# Patient Record
Sex: Female | Born: 1960
Health system: Southern US, Community
[De-identification: ages and names within clinical notes are randomized; demographics above are authoritative.]

## PROBLEM LIST (undated history)

## (undated) DIAGNOSIS — F419 Anxiety disorder, unspecified: Secondary | ICD-10-CM

## (undated) DIAGNOSIS — I10 Essential (primary) hypertension: Secondary | ICD-10-CM

## (undated) DIAGNOSIS — K219 Gastro-esophageal reflux disease without esophagitis: Secondary | ICD-10-CM

## (undated) DIAGNOSIS — Z21 Asymptomatic human immunodeficiency virus [HIV] infection status: Secondary | ICD-10-CM

## (undated) DIAGNOSIS — F32A Depression, unspecified: Secondary | ICD-10-CM

## (undated) DIAGNOSIS — F209 Schizophrenia, unspecified: Secondary | ICD-10-CM

## (undated) DIAGNOSIS — F329 Major depressive disorder, single episode, unspecified: Secondary | ICD-10-CM

## (undated) DIAGNOSIS — G459 Transient cerebral ischemic attack, unspecified: Secondary | ICD-10-CM

## (undated) DIAGNOSIS — M1990 Unspecified osteoarthritis, unspecified site: Secondary | ICD-10-CM

## (undated) DIAGNOSIS — E7800 Pure hypercholesterolemia, unspecified: Secondary | ICD-10-CM

## (undated) DIAGNOSIS — Z720 Tobacco use: Secondary | ICD-10-CM

## (undated) DIAGNOSIS — F1910 Other psychoactive substance abuse, uncomplicated: Secondary | ICD-10-CM

## (undated) DIAGNOSIS — B20 Human immunodeficiency virus [HIV] disease: Secondary | ICD-10-CM

## (undated) DIAGNOSIS — S71139A Puncture wound without foreign body, unspecified thigh, initial encounter: Secondary | ICD-10-CM

## (undated) DIAGNOSIS — R569 Unspecified convulsions: Secondary | ICD-10-CM

## (undated) DIAGNOSIS — W3400XA Accidental discharge from unspecified firearms or gun, initial encounter: Secondary | ICD-10-CM

## (undated) HISTORY — DX: Unspecified convulsions: R56.9

## (undated) HISTORY — DX: Accidental discharge from unspecified firearms or gun, initial encounter: W34.00XA

## (undated) HISTORY — DX: Human immunodeficiency virus (HIV) disease: B20

## (undated) HISTORY — DX: Puncture wound without foreign body, unspecified thigh, initial encounter: S71.139A

## (undated) HISTORY — DX: Tobacco use: Z72.0

## (undated) HISTORY — DX: Essential (primary) hypertension: I10

## (undated) HISTORY — PX: OTHER SURGICAL HISTORY: SHX169

## (undated) HISTORY — PX: FRACTURE SURGERY: SHX138

## (undated) HISTORY — DX: Other psychoactive substance abuse, uncomplicated: F19.10

## (undated) HISTORY — DX: Gastro-esophageal reflux disease without esophagitis: K21.9

## (undated) HISTORY — DX: Asymptomatic human immunodeficiency virus (hiv) infection status: Z21

## (undated) HISTORY — DX: Unspecified osteoarthritis, unspecified site: M19.90

## (undated) HISTORY — DX: Transient cerebral ischemic attack, unspecified: G45.9

---

## 1983-09-04 HISTORY — PX: TUBAL LIGATION: SHX77

## 1987-09-04 DIAGNOSIS — S71139A Puncture wound without foreign body, unspecified thigh, initial encounter: Secondary | ICD-10-CM

## 1987-09-04 HISTORY — DX: Puncture wound without foreign body, unspecified thigh, initial encounter: S71.139A

## 2008-09-03 DIAGNOSIS — G459 Transient cerebral ischemic attack, unspecified: Secondary | ICD-10-CM

## 2008-09-03 HISTORY — DX: Transient cerebral ischemic attack, unspecified: G45.9

## 2010-06-12 LAB — HM MAMMOGRAPHY: HM Mammogram: NORMAL

## 2010-06-12 LAB — HM PAP SMEAR: HM Pap smear: NORMAL

## 2011-03-15 ENCOUNTER — Ambulatory Visit (INDEPENDENT_AMBULATORY_CARE_PROVIDER_SITE_OTHER): Payer: Medicare Other

## 2011-03-15 ENCOUNTER — Other Ambulatory Visit: Payer: Self-pay

## 2011-03-15 DIAGNOSIS — Z79899 Other long term (current) drug therapy: Secondary | ICD-10-CM

## 2011-03-15 DIAGNOSIS — B20 Human immunodeficiency virus [HIV] disease: Secondary | ICD-10-CM

## 2011-03-15 DIAGNOSIS — Z113 Encounter for screening for infections with a predominantly sexual mode of transmission: Secondary | ICD-10-CM

## 2011-03-15 DIAGNOSIS — E1169 Type 2 diabetes mellitus with other specified complication: Secondary | ICD-10-CM

## 2011-03-15 DIAGNOSIS — IMO0001 Reserved for inherently not codable concepts without codable children: Secondary | ICD-10-CM

## 2011-03-15 DIAGNOSIS — E119 Type 2 diabetes mellitus without complications: Secondary | ICD-10-CM

## 2011-03-15 DIAGNOSIS — E785 Hyperlipidemia, unspecified: Secondary | ICD-10-CM

## 2011-03-15 DIAGNOSIS — Z111 Encounter for screening for respiratory tuberculosis: Secondary | ICD-10-CM

## 2011-03-15 DIAGNOSIS — F1920 Other psychoactive substance dependence, uncomplicated: Secondary | ICD-10-CM

## 2011-03-15 DIAGNOSIS — J439 Emphysema, unspecified: Secondary | ICD-10-CM

## 2011-03-15 MED ORDER — TRAZODONE HCL 150 MG PO TABS: 150.0000 mg | ORAL_TABLET | Freq: Every day | ORAL | Status: DC

## 2011-03-15 MED ORDER — ARIPIPRAZOLE 30 MG PO TABS: 30.0000 mg | ORAL_TABLET | Freq: Every day | ORAL | Status: DC

## 2011-03-15 MED ORDER — CITALOPRAM HYDROBROMIDE 20 MG PO TABS: 20.0000 mg | ORAL_TABLET | Freq: Every day | ORAL | Status: DC

## 2011-03-16 LAB — COMPLETE METABOLIC PANEL WITH GFR
ALT: 19 U/L (ref 0–35)
AST: 20 U/L (ref 0–37)
Albumin: 3.9 g/dL (ref 3.5–5.2)
CO2: 24 mEq/L (ref 19–32)
Calcium: 8.9 mg/dL (ref 8.4–10.5)
Chloride: 101 mEq/L (ref 96–112)
GFR, Est African American: >60 mL/min (ref >60)
Potassium: 4.1 mEq/L (ref 3.5–5.3)

## 2011-03-16 LAB — T-HELPER CELL (CD4) - (RCID CLINIC ONLY)
CD4 % Helper T Cell: 16 % — ABNORMAL LOW (ref 33–55)
CD4 T Cell Abs: 300 uL — ABNORMAL LOW (ref 400–2700)

## 2011-03-16 LAB — URINALYSIS, ROUTINE W REFLEX MICROSCOPIC
Hgb urine dipstick: NEGATIVE
Leukocytes, UA: NEGATIVE
Nitrite: NEGATIVE
Specific Gravity, Urine: 1.025 (ref 1.005–1.030)
pH: 5.5 (ref 5.0–8.0)

## 2011-03-16 LAB — HEPATITIS C ANTIBODY: HCV Ab: NEGATIVE

## 2011-03-16 LAB — GC/CHLAMYDIA PROBE AMP, URINE: GC Probe Amp, Urine: NEGATIVE

## 2011-03-16 LAB — LIPID PANEL
HDL: 41 mg/dL (ref >39)
LDL Cholesterol: 98 mg/dL (ref 0–99)
Total CHOL/HDL Ratio: 5 Ratio
VLDL: 64 mg/dL — ABNORMAL HIGH (ref 0–40)

## 2011-03-19 LAB — TB SKIN TEST
Induration: 0
TB Skin Test: NEGATIVE mm

## 2011-03-19 LAB — HIV-1 RNA QUANT-NO REFLEX-BLD
HIV 1 RNA Quant: <20 copies/mL (ref <20)
HIV-1 RNA Quant, Log: <1.3 {Log} (ref <1.30)

## 2011-03-19 MED ORDER — TRAZODONE HCL 150 MG PO TABS: 150.0000 mg | ORAL_TABLET | Freq: Every day | ORAL | Status: DC

## 2011-03-19 MED ORDER — CITALOPRAM HYDROBROMIDE 20 MG PO TABS: 20.0000 mg | ORAL_TABLET | Freq: Every day | ORAL | Status: DC

## 2011-03-19 MED ORDER — ARIPIPRAZOLE 30 MG PO TABS
30.0000 mg | ORAL_TABLET | Freq: Every day | ORAL | Status: DC
Start: 2011-03-19 — End: 2011-03-28

## 2011-03-19 NOTE — Telephone Encounter (Signed)
Medications phoned to [facility] pharmacy. Pt says they were not at pharmacy on day of intake when they were requested.  Tammy King,RN IV

## 2011-03-28 ENCOUNTER — Encounter: Payer: Self-pay | Admitting: Internal Medicine

## 2011-03-28 ENCOUNTER — Ambulatory Visit (INDEPENDENT_AMBULATORY_CARE_PROVIDER_SITE_OTHER): Payer: Medicare Other | Admitting: Internal Medicine

## 2011-03-28 DIAGNOSIS — I10 Essential (primary) hypertension: Secondary | ICD-10-CM

## 2011-03-28 DIAGNOSIS — B20 Human immunodeficiency virus [HIV] disease: Secondary | ICD-10-CM | POA: Insufficient documentation

## 2011-03-28 DIAGNOSIS — F259 Schizoaffective disorder, unspecified: Secondary | ICD-10-CM

## 2011-03-28 DIAGNOSIS — E119 Type 2 diabetes mellitus without complications: Secondary | ICD-10-CM

## 2011-03-28 MED ORDER — CYCLOBENZAPRINE HCL 5 MG PO TABS: 5.0000 mg | ORAL_TABLET | Freq: Two times a day (BID) | ORAL | Status: DC | PRN

## 2011-03-28 MED ORDER — CLONIDINE HCL 0.1 MG PO TABS
0.2000 mg | ORAL_TABLET | Freq: Once | ORAL | Status: AC
  Administered 2011-03-28: 0.2 mg via ORAL

## 2011-03-28 MED ORDER — METFORMIN HCL ER (OSM) 1000 MG PO TB24: 1000.0000 mg | ORAL_TABLET | Freq: Two times a day (BID) | ORAL | Status: DC

## 2011-03-28 MED ORDER — TRAZODONE HCL 150 MG PO TABS: 150.0000 mg | ORAL_TABLET | Freq: Every day | ORAL | Status: DC

## 2011-03-28 MED ORDER — CLONIDINE HCL 0.1 MG PO TABS: 0.1000 mg | ORAL_TABLET | Freq: Two times a day (BID) | ORAL | Status: DC

## 2011-03-28 MED ORDER — ENALAPRIL MALEATE 10 MG PO TABS: 10.0000 mg | ORAL_TABLET | Freq: Every day | ORAL | Status: DC

## 2011-03-28 MED ORDER — FLUTICASONE-SALMETEROL 230-21 MCG/ACT IN AERO: 2.0000 | INHALATION_SPRAY | Freq: Two times a day (BID) | RESPIRATORY_TRACT | Status: DC

## 2011-03-28 MED ORDER — ARIPIPRAZOLE 30 MG PO TABS: 30.0000 mg | ORAL_TABLET | Freq: Every day | ORAL | Status: DC

## 2011-03-28 MED ORDER — ATENOLOL 25 MG PO TABS: 25.0000 mg | ORAL_TABLET | Freq: Every day | ORAL | Status: DC

## 2011-03-28 MED ORDER — CITALOPRAM HYDROBROMIDE 20 MG PO TABS: 20.0000 mg | ORAL_TABLET | Freq: Every day | ORAL | Status: DC

## 2011-03-28 MED ORDER — EFAVIRENZ-EMTRICITAB-TENOFOVIR 600-200-300 MG PO TABS: 1.0000 | ORAL_TABLET | Freq: Every day | ORAL | Status: DC

## 2011-03-28 NOTE — Assessment & Plan Note (Signed)
Elevated, will add clonidine to current regimen.  Recheck in 1 week.

## 2011-03-28 NOTE — Assessment & Plan Note (Addendum)
I will continue with her metformin and have her bring a blood sugar log.  A1C ok.  Refer to PCP, ophtho.  Will add aspirin next visit.   She also may need triglceride thearpy with weight loss does not work.  I did though discuss at length weight loss and exercise.  The patient is motivated at this time.

## 2011-03-28 NOTE — Assessment & Plan Note (Addendum)
Doing well on her meds, I would like to recheck in about 1 month to assure continued compliance.  Emphasized prevention with condoms.  She has had vaccines for pneumovax, hep A and B, though titers do not reflect that.  She does report that was the same with her previous clinic.  Therefore will not revaccinate.

## 2011-03-28 NOTE — Progress Notes (Signed)
  Subjective:    Patient ID: Susan [name], female    DOB: 1961/05/03, 50 y.o.   MRN: [id number]  HPI Here as a new patient.  First diagnosed in 2006 and started initially on a regimen but due to drug abuse she stopped.  She then reports about 2 years ago, she was stable and started on Atripla and has been doing well since.  She has just recently moved to [place] where she has family and has no complaints today.  She was recently hospitalized for apparent DKA/HONK and for pneumonia, but does not think it was HIV related.  She also has diabetes, htn and schizoaffective d/o.  Her A1c here is 7.3, she is unaware of it previously.  She does not have any healthcare providers here yet.     She does report ophtho appt last year with no issues.  Does have headaches, relieved by OTC meds.      Review of Systems  Constitutional: Negative.   Eyes: Negative.   Respiratory: Negative.   Cardiovascular: Negative.   Gastrointestinal: Negative.   Genitourinary: Negative.   Musculoskeletal: Negative.   Skin: Negative.   Neurological: Positive for headaches. Negative for dizziness and syncope.  Hematological: Negative.   Psychiatric/Behavioral: Positive for dysphoric mood. Negative for suicidal ideas.       Objective:   Physical Exam  Constitutional: She is oriented to person, place, and time. She appears well-developed and well-nourished. No distress.       moridly obese   HENT:  Mouth/Throat: No oropharyngeal exudate.  Eyes: No scleral icterus.  Cardiovascular: Normal rate, regular rhythm and normal heart sounds.  Exam reveals no gallop and no friction rub.   No murmur heard. Pulmonary/Chest: Effort normal and breath sounds normal. No respiratory distress. She has no wheezes.  Abdominal: Soft. Bowel sounds are normal. She exhibits no distension.  Lymphadenopathy:    She has no cervical adenopathy.  Neurological: She is alert and oriented to person, place, and time.  Skin: Skin is warm and dry. No  erythema.  Psychiatric: She has a normal mood and affect. Her behavior is normal.       pleasant          Assessment & Plan:

## 2011-03-29 ENCOUNTER — Telehealth: Payer: Self-pay | Admitting: *Deleted

## 2011-03-29 NOTE — Telephone Encounter (Signed)
Called patient to notify of Optometrist appointment with [facility] for 04/17/11 at 1:20 pm.  Faxed medication list. Wendall Mola CMA

## 2011-04-04 ENCOUNTER — Ambulatory Visit: Payer: Medicare Other

## 2011-04-26 ENCOUNTER — Ambulatory Visit (INDEPENDENT_AMBULATORY_CARE_PROVIDER_SITE_OTHER): Payer: Medicare Other | Admitting: Internal Medicine

## 2011-04-26 ENCOUNTER — Encounter: Payer: Self-pay | Admitting: Internal Medicine

## 2011-04-26 DIAGNOSIS — B20 Human immunodeficiency virus [HIV] disease: Secondary | ICD-10-CM

## 2011-04-26 DIAGNOSIS — F259 Schizoaffective disorder, unspecified: Secondary | ICD-10-CM

## 2011-04-26 DIAGNOSIS — E119 Type 2 diabetes mellitus without complications: Secondary | ICD-10-CM

## 2011-04-26 DIAGNOSIS — I10 Essential (primary) hypertension: Secondary | ICD-10-CM

## 2011-04-26 DIAGNOSIS — F250 Schizoaffective disorder, bipolar type: Secondary | ICD-10-CM | POA: Insufficient documentation

## 2011-04-26 MED ORDER — SIMVASTATIN 20 MG PO TABS: 20.0000 mg | ORAL_TABLET | Freq: Every evening | ORAL | Status: DC

## 2011-04-26 MED ORDER — PHENTERMINE HCL 37.5 MG PO CAPS: 37.5000 mg | ORAL_CAPSULE | ORAL | Status: AC

## 2011-04-26 MED ORDER — ENALAPRIL MALEATE 20 MG PO TABS: 20.0000 mg | ORAL_TABLET | Freq: Every day | ORAL | Status: DC

## 2011-04-26 MED ORDER — ASPIRIN EC 81 MG PO TBEC: 81.0000 mg | DELAYED_RELEASE_TABLET | Freq: Every day | ORAL | Status: DC

## 2011-04-26 NOTE — Assessment & Plan Note (Signed)
Dx 2006 - On tx since 2010 and follows with ID for same - undetectable VL;  The current medical regimen is effective;  continue present plan and medications.

## 2011-04-26 NOTE — Patient Instructions (Addendum)
It was good to see you today. We have reviewed your prior records including labs and tests today Start simvastatin (zocor) and baby aspirin as discussed due to stroke history and prevention of recurrent problems -  Increase enalapril to 20mg  daily -  Your prescription(s) have been submitted to your pharmacy. Please take as directed and contact our office if you believe you are having problem(s) with the medication(s). Ok to try phentermine for appetite suppression x 30days - you will need to prove successful weight loss before refills are given AND only 3 months will be provided if with successful weight loss -  Work on lifestyle changes as discussed (low fat, low carb, increased protein diet; improved exercise efforts; weight loss) to control sugar, blood pressure and cholesterol levels and/or reduce risk of developing other medical problems. Look into [email or web address] or other type of food journal to assist you in this process. we'll make referral to gynecology for PAP/pelvic. Our office will contact you regarding appointment(s) once made. Please schedule followup in 3 months for blood pressure and diabetes check, call sooner if problems.

## 2011-04-26 NOTE — Assessment & Plan Note (Signed)
subop control - recent addition of clonidine by ID 03/2011 Will increase ACEI to max dose now - new erx done Continue follow up and titration, esp with TIA hx and other co morbities May also improve with diet/exercise and weight loss BP Readings from Last 3 Encounters:  04/26/11 162/92  03/28/11 157/85

## 2011-04-26 NOTE — Assessment & Plan Note (Signed)
Wt Readings from Last 3 Encounters:  04/26/11 331 lb 6.4 oz (150.322 kg)  03/28/11 326 lb (147.873 kg)   Reviewed need for weight reduction to optimize health status - pt reports working on same with improved diet/exercise habits Ok to try phentermine x 30d - must show successful weight loss and no refills beyond 3 consecutive months -  Potential risk/benefits reviewed - pt understands all and agrees to same

## 2011-04-26 NOTE — Assessment & Plan Note (Signed)
Stable on current meds - appt to est with [facility] upcoming in 05/2011 per pt The current medical regimen is effective;  continue present plan and medications.
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
## 2011-04-26 NOTE — Assessment & Plan Note (Signed)
Dx 10/2010 in [place] during hospitalization for HONK - on insulin +met until 01/2011 - now metformin only Doing well by review of home cbg log and a1c - The current medical regimen is effective;  continue present plan and medications.  Encouraged efforts at weight loss as ongoing with diet and exercise On ACE, add statin and ASA, esp with TIA hx 2010 Lab Results  Component Value Date   HGBA1C 7.3* 03/15/2011

## 2011-05-04 ENCOUNTER — Encounter: Payer: Self-pay | Admitting: Internal Medicine

## 2011-05-04 ENCOUNTER — Ambulatory Visit (INDEPENDENT_AMBULATORY_CARE_PROVIDER_SITE_OTHER): Payer: Medicare Other | Admitting: Internal Medicine

## 2011-05-04 VITALS — BP 130/84 | HR 71 | Temp 98.1°F | Wt 322.0 lb

## 2011-05-04 DIAGNOSIS — B20 Human immunodeficiency virus [HIV] disease: Secondary | ICD-10-CM

## 2011-05-04 LAB — T-HELPER CELL (CD4) - (RCID CLINIC ONLY): CD4 % Helper T Cell: 15 % — ABNORMAL LOW (ref 33–55)

## 2011-05-04 NOTE — Assessment & Plan Note (Signed)
She has excellent adherence with her medications and continues to do well. She is motivated to take better care of herself and is doing all the right things. I will recheck her labs today at I am still concerned of resistance since she had been on Atripla previously and then abruptly stopped. I discussed that if her labs continued to be reassuring today with an undetectable viral load I will have her followup in 4 months. If the virus is detectable today I will have her return sooner. I encouraged continued healthy choices and discussed condom use for prevention.  Her diabetes and blood pressure she reports is better and better control and her blood pressure is good today.

## 2011-05-04 NOTE — Progress Notes (Signed)
  Subjective:    Patient ID: Susan [name], female    DOB: 10-16-1960, 50 y.o.   MRN: [id number]  HPI this 50 year old female comes in for followup. She restarted her medication with Atripla after having been off Atripla do to drug use. At the last visit the patient's blood pressure was elevated and she had not seen a primary care physician for her diabetes and other issues. Her viral load at that time though was undetectable. And today she reports excellent compliance with her medications. She also has seen her primary care physician who is helping her with her diabetes, hypertension and obesity. She states she has lost 2 pounds feels well is exercising and trying to eat right. She continues to be drug-free. She also has an appointment for a Pap smear. Today she has no specific complaints. No fever, no dysphasia, no shortness of breath, no diarrhea.    Review of Systems  Constitutional: Negative.   HENT: Negative.   Eyes: Negative.   Respiratory: Negative.   Cardiovascular: Negative.   Gastrointestinal: Negative.   Genitourinary: Negative.   Musculoskeletal: Negative.   Skin: Negative.   Neurological: Negative.   Hematological: Negative.   Psychiatric/Behavioral: Negative.        Objective:   Physical Exam  Constitutional: She is oriented to person, place, and time. She appears well-developed and well-nourished.       Morbidly obese   HENT:  Mouth/Throat: No oropharyngeal exudate.  Cardiovascular: Normal rate, regular rhythm and normal heart sounds.   No murmur heard. Pulmonary/Chest: Effort normal and breath sounds normal. No respiratory distress. She has no wheezes.  Abdominal: Soft. Bowel sounds are normal. She exhibits no distension. There is no tenderness.  Neurological: She is alert and oriented to person, place, and time.  Skin: Skin is warm and dry. No erythema.  Psychiatric: She has a normal mood and affect. Her behavior is normal.          Assessment & Plan:

## 2011-05-08 LAB — HIV-1 RNA ULTRAQUANT REFLEX TO GENTYP+: HIV-1 RNA Quant, Log: <1.3 {Log} (ref <1.30)

## 2011-05-09 DIAGNOSIS — Z23 Encounter for immunization: Secondary | ICD-10-CM

## 2011-05-09 DIAGNOSIS — B20 Human immunodeficiency virus [HIV] disease: Secondary | ICD-10-CM

## 2011-05-09 NOTE — Progress Notes (Signed)
Addended by: Jennet Maduro D on: 05/09/2011 09:11 AM   Modules accepted: Orders

## 2011-05-22 ENCOUNTER — Telehealth: Payer: Self-pay | Admitting: *Deleted

## 2011-05-22 NOTE — Telephone Encounter (Signed)
Receive forms completed & fax back info to insurance. Waiting on response from insurance company.Marland KitchenMarland Kitchen9/18/12@2 :12pm/LMB

## 2011-05-22 NOTE — Telephone Encounter (Signed)
Received fax pt need PA on Metformin ER 1000mg  take 1 by mouth twice daily. PA # [phone] ID # [id number]. Called insurance spoke with rep [place]. Faxing over PA form to be completed.Marland KitchenMarland Kitchen9/18/12@2 :02pm/LMB

## 2011-05-23 NOTE — Telephone Encounter (Signed)
PA Approval received, through 09/03/2011

## 2011-06-19 ENCOUNTER — Other Ambulatory Visit: Payer: Self-pay | Admitting: *Deleted

## 2011-06-19 DIAGNOSIS — B20 Human immunodeficiency virus [HIV] disease: Secondary | ICD-10-CM

## 2011-06-19 MED ORDER — EFAVIRENZ-EMTRICITAB-TENOFOVIR 600-200-300 MG PO TABS: 1.0000 | ORAL_TABLET | Freq: Every day | ORAL | Status: DC

## 2011-06-24 ENCOUNTER — Emergency Department (HOSPITAL_COMMUNITY)
Admission: EM | Admit: 2011-06-24 | Discharge: 2011-06-25 | Disposition: A | Discharge status: Home / Self Care | Payer: Medicare Other | Attending: Emergency Medicine | Admitting: Emergency Medicine

## 2011-06-24 DIAGNOSIS — B3731 Acute candidiasis of vulva and vagina: Secondary | ICD-10-CM | POA: Insufficient documentation

## 2011-06-24 DIAGNOSIS — Z79899 Other long term (current) drug therapy: Secondary | ICD-10-CM | POA: Insufficient documentation

## 2011-06-24 DIAGNOSIS — R109 Unspecified abdominal pain: Secondary | ICD-10-CM | POA: Insufficient documentation

## 2011-06-24 DIAGNOSIS — R300 Dysuria: Secondary | ICD-10-CM | POA: Insufficient documentation

## 2011-06-24 DIAGNOSIS — Z21 Asymptomatic human immunodeficiency virus [HIV] infection status: Secondary | ICD-10-CM | POA: Insufficient documentation

## 2011-06-24 DIAGNOSIS — Z7982 Long term (current) use of aspirin: Secondary | ICD-10-CM | POA: Insufficient documentation

## 2011-06-24 DIAGNOSIS — F319 Bipolar disorder, unspecified: Secondary | ICD-10-CM | POA: Insufficient documentation

## 2011-06-24 DIAGNOSIS — B373 Candidiasis of vulva and vagina: Secondary | ICD-10-CM | POA: Insufficient documentation

## 2011-06-24 DIAGNOSIS — R10819 Abdominal tenderness, unspecified site: Secondary | ICD-10-CM | POA: Insufficient documentation

## 2011-06-24 DIAGNOSIS — I10 Essential (primary) hypertension: Secondary | ICD-10-CM | POA: Insufficient documentation

## 2011-06-24 DIAGNOSIS — N390 Urinary tract infection, site not specified: Secondary | ICD-10-CM | POA: Insufficient documentation

## 2011-06-24 DIAGNOSIS — E119 Type 2 diabetes mellitus without complications: Secondary | ICD-10-CM | POA: Insufficient documentation

## 2011-06-24 LAB — POCT I-STAT, CHEM 8
Calcium, Ion: 1.14 mmol/L (ref 1.12–1.32)
Creatinine, Ser: 0.6 mg/dL (ref 0.50–1.10)
Glucose, Bld: 306 mg/dL — ABNORMAL HIGH (ref 70–99)
Hemoglobin: 14.6 g/dL (ref 12.0–15.0)
TCO2: 22 mmol/L (ref 0–100)

## 2011-06-24 LAB — URINALYSIS, ROUTINE W REFLEX MICROSCOPIC
Nitrite: NEGATIVE
Specific Gravity, Urine: 1.046 — ABNORMAL HIGH (ref 1.005–1.030)
Urobilinogen, UA: 0.2 mg/dL (ref 0.0–1.0)

## 2011-06-24 LAB — URINE MICROSCOPIC-ADD ON

## 2011-06-24 LAB — GLUCOSE, CAPILLARY: Glucose-Capillary: 287 mg/dL — ABNORMAL HIGH (ref 70–99)

## 2011-06-25 LAB — DIFFERENTIAL
Basophils Absolute: 0.1 10*3/uL (ref 0.0–0.1)
Basophils Relative: 1 % (ref 0–1)
Eosinophils Relative: 5 % (ref 0–5)
Monocytes Absolute: 0.8 10*3/uL (ref 0.1–1.0)
Monocytes Relative: 8 % (ref 3–12)

## 2011-06-25 LAB — CBC
HCT: 38.8 % (ref 36.0–46.0)
MCH: 28.9 pg (ref 26.0–34.0)
MCHC: 35.3 g/dL (ref 30.0–36.0)
RDW: 14.7 % (ref 11.5–15.5)

## 2011-06-25 LAB — GLUCOSE, CAPILLARY
Glucose-Capillary: 260 mg/dL — ABNORMAL HIGH (ref 70–99)
Glucose-Capillary: 149 mg/dL — ABNORMAL HIGH (ref 70–99)

## 2011-06-26 LAB — URINE CULTURE: Culture  Setup Time: 201210212348

## 2011-07-29 ENCOUNTER — Inpatient Hospital Stay (HOSPITAL_COMMUNITY)
Admission: AD | Admit: 2011-07-29 | Discharge: 2011-07-29 | Disposition: A | Discharge status: Home / Self Care | Payer: Medicare Other | Source: Ambulatory Visit | Attending: Emergency Medicine | Admitting: Emergency Medicine

## 2011-07-29 ENCOUNTER — Encounter (HOSPITAL_COMMUNITY): Payer: Self-pay | Admitting: Family

## 2011-07-29 DIAGNOSIS — I10 Essential (primary) hypertension: Secondary | ICD-10-CM | POA: Insufficient documentation

## 2011-07-29 DIAGNOSIS — E1159 Type 2 diabetes mellitus with other circulatory complications: Secondary | ICD-10-CM

## 2011-07-29 DIAGNOSIS — R7309 Other abnormal glucose: Secondary | ICD-10-CM | POA: Insufficient documentation

## 2011-07-29 DIAGNOSIS — N760 Acute vaginitis: Secondary | ICD-10-CM | POA: Insufficient documentation

## 2011-07-29 DIAGNOSIS — L293 Anogenital pruritus, unspecified: Secondary | ICD-10-CM | POA: Insufficient documentation

## 2011-07-29 DIAGNOSIS — R739 Hyperglycemia, unspecified: Secondary | ICD-10-CM

## 2011-07-29 HISTORY — DX: Pure hypercholesterolemia, unspecified: E78.00

## 2011-07-29 LAB — URINALYSIS, ROUTINE W REFLEX MICROSCOPIC
Bilirubin Urine: NEGATIVE
Glucose, UA: >1000 mg/dL — AB
Nitrite: NEGATIVE
Protein, ur: NEGATIVE mg/dL
Specific Gravity, Urine: >1.046 — ABNORMAL HIGH (ref 1.005–1.030)
Urobilinogen, UA: 0.2 mg/dL (ref 0.0–1.0)
pH: 5.5 (ref 5.0–8.0)

## 2011-07-29 LAB — URINE MICROSCOPIC-ADD ON

## 2011-07-29 LAB — DIFFERENTIAL
Basophils Absolute: 0 K/uL (ref 0.0–0.1)
Basophils Relative: 0 % (ref 0–1)
Eosinophils Absolute: 0.3 K/uL (ref 0.0–0.7)
Eosinophils Relative: 4 % (ref 0–5)
Lymphocytes Relative: 28 % (ref 12–46)
Lymphs Abs: 2 K/uL (ref 0.7–4.0)
Monocytes Absolute: 0.6 K/uL (ref 0.1–1.0)
Monocytes Relative: 9 % (ref 3–12)
Neutro Abs: 4.2 K/uL (ref 1.7–7.7)
Neutrophils Relative %: 59 % (ref 43–77)

## 2011-07-29 LAB — BASIC METABOLIC PANEL
BUN: 8 mg/dL (ref 6–23)
CO2: 22 mEq/L (ref 19–32)
Calcium: 9.2 mg/dL (ref 8.4–10.5)
Creatinine, Ser: 0.58 mg/dL (ref 0.50–1.10)
GFR calc non Af Amer: >90 mL/min (ref >90)
Glucose, Bld: 302 mg/dL — ABNORMAL HIGH (ref 70–99)
Sodium: 133 mEq/L — ABNORMAL LOW (ref 135–145)

## 2011-07-29 LAB — WET PREP, GENITAL
Trich, Wet Prep: NONE SEEN
Yeast Wet Prep HPF POC: NONE SEEN

## 2011-07-29 LAB — GLUCOSE, CAPILLARY
Glucose-Capillary: 376 mg/dL — ABNORMAL HIGH (ref 70–99)
Glucose-Capillary: 260 mg/dL — ABNORMAL HIGH (ref 70–99)

## 2011-07-29 LAB — CBC
HCT: 39.3 % (ref 36.0–46.0)
Hemoglobin: 13.2 g/dL (ref 12.0–15.0)
MCH: 28 pg (ref 26.0–34.0)
MCHC: 33.6 g/dL (ref 30.0–36.0)
MCV: 83.3 fL (ref 78.0–100.0)
Platelets: 204 K/uL (ref 150–400)
RBC: 4.72 MIL/uL (ref 3.87–5.11)
RDW: 14.1 % (ref 11.5–15.5)
WBC: 7.1 K/uL (ref 4.0–10.5)

## 2011-07-29 MED ORDER — SODIUM CHLORIDE 0.9 % IV BOLUS (SEPSIS)
1000.0000 mL | Freq: Once | INTRAVENOUS | Status: AC
  Administered 2011-07-29: 1000 mL via INTRAVENOUS

## 2011-07-29 MED ORDER — METRONIDAZOLE 500 MG PO TABS: 500.0000 mg | ORAL_TABLET | Freq: Two times a day (BID) | ORAL | Status: AC

## 2011-07-29 MED ORDER — INSULIN ASPART PROT & ASPART (70-30 MIX) 100 UNIT/ML ~~LOC~~ SUSP
10.0000 [IU] | Freq: Once | SUBCUTANEOUS | Status: DC
  Filled 2011-07-29: qty 3

## 2011-07-29 MED ORDER — INSULIN ASPART 100 UNIT/ML ~~LOC~~ SOLN
10.0000 [IU] | Freq: Once | SUBCUTANEOUS | Status: AC
  Administered 2011-07-29: 10 [IU] via SUBCUTANEOUS

## 2011-07-29 MED ORDER — INSULIN ASPART 100 UNIT/ML ~~LOC~~ SOLN
SUBCUTANEOUS | Status: AC
  Administered 2011-07-29: 10 [IU] via SUBCUTANEOUS
  Filled 2011-07-29: qty 10

## 2011-07-29 NOTE — Progress Notes (Signed)
"  For a couple of days I've been having real heavy d/c that's itchy and my pee smells very sweet.  My blood sugars have been as high as 391, but it was 571 about an hour ago."

## 2011-07-29 NOTE — ED Notes (Signed)
[id number]<AV> Expected date:<BR> Expected time:<BR> Means of arrival:<BR> Comments:<BR> Hyperglycemia, Transfer from [facility]
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
## 2011-07-29 NOTE — Progress Notes (Signed)
"  I checked my sugar about an hour ago and it was 571" THink I have a yeast infection

## 2011-07-29 NOTE — ED Notes (Signed)
Capillary blood glucose 260

## 2011-07-29 NOTE — Progress Notes (Signed)
"  I went to the ER about 3 weeks ago and had a real bad yeast infection.  That is what raised my diabetes before."
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
## 2011-07-30 LAB — GLUCOSE, CAPILLARY: Glucose-Capillary: 329 mg/dL — ABNORMAL HIGH (ref 70–99)

## 2011-07-31 NOTE — ED Provider Notes (Signed)
Agree with above note.  Ahriana Gunkel 07/31/2011 8:51 AM

## 2011-08-02 ENCOUNTER — Ambulatory Visit (INDEPENDENT_AMBULATORY_CARE_PROVIDER_SITE_OTHER): Payer: Medicare Other | Admitting: Internal Medicine

## 2011-08-02 ENCOUNTER — Other Ambulatory Visit (INDEPENDENT_AMBULATORY_CARE_PROVIDER_SITE_OTHER): Payer: Medicare Other

## 2011-08-02 ENCOUNTER — Encounter: Payer: Self-pay | Admitting: Internal Medicine

## 2011-08-02 DIAGNOSIS — E119 Type 2 diabetes mellitus without complications: Secondary | ICD-10-CM

## 2011-08-02 DIAGNOSIS — I10 Essential (primary) hypertension: Secondary | ICD-10-CM

## 2011-08-02 MED ORDER — AMLODIPINE BESYLATE 2.5 MG PO TABS: 2.5000 mg | ORAL_TABLET | Freq: Every day | ORAL | Status: DC

## 2011-08-02 MED ORDER — SITAGLIPTIN PHOSPHATE 100 MG PO TABS: 100.0000 mg | ORAL_TABLET | Freq: Every day | ORAL | Status: DC

## 2011-08-02 NOTE — Patient Instructions (Signed)
It was good to see you today. Start Januvia in addition to metformin for her diabetes Also add amlodipine to ongoing medications for blood pressure Test(s) ordered today. Your results will be called to you after review (48-72hours after test completion). If any changes need to be made, you will be notified at that time. Your prescription(s) have been submitted to your pharmacy. Please take as directed and contact our office if you believe you are having problem(s) with the medication(s). Please schedule followup in 3 months for blood pressure and diabetes check, call sooner if problems.

## 2011-08-02 NOTE — Assessment & Plan Note (Signed)
subop control - addition of clonidine by ID 03/2011 increased ACEI to max dose 04/2011-  Avoid increase beta-blocker dose due to borderline bradycardia Add low dose amlodipine now Continue follow up and titration, esp with TIA hx and other co morbities May also improve with diet/exercise and weight loss BP Readings from Last 3 Encounters:  08/02/11 142/84  07/29/11 156/73  05/04/11 130/84

## 2011-08-02 NOTE — Assessment & Plan Note (Signed)
Dx 10/2010 in [place] during hospitalization for HONK - on insulin +met until 01/2011 - now metformin only Suboptimal control per review of home cbg log  Check A1c now and adjust medications as needed - [place] Encouraged efforts at weight loss as ongoing with diet and exercise On ACE, added statin and ASA 04/2011, esp with TIA hx 2010 Lab Results  Component Value Date   HGBA1C 7.3* 03/15/2011
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
## 2011-08-09 ENCOUNTER — Other Ambulatory Visit: Payer: Self-pay | Admitting: Internal Medicine

## 2011-08-09 ENCOUNTER — Other Ambulatory Visit: Payer: Medicare Other

## 2011-08-09 DIAGNOSIS — B20 Human immunodeficiency virus [HIV] disease: Secondary | ICD-10-CM

## 2011-08-10 LAB — CBC WITH DIFFERENTIAL/PLATELET
Basophils Relative: 0 % (ref 0–1)
Eosinophils Absolute: 0.3 10*3/uL (ref 0.0–0.7)
Eosinophils Relative: 4 % (ref 0–5)
Lymphs Abs: 1.8 10*3/uL (ref 0.7–4.0)
MCH: 27.5 pg (ref 26.0–34.0)
MCHC: 31.9 g/dL (ref 30.0–36.0)
MCV: 86.3 fL (ref 78.0–100.0)
Neutrophils Relative %: 65 % (ref 43–77)
Platelets: 264 10*3/uL (ref 150–400)
RBC: 4.83 MIL/uL (ref 3.87–5.11)
RDW: 14.6 % (ref 11.5–15.5)

## 2011-08-10 LAB — COMPLETE METABOLIC PANEL WITH GFR
ALT: 16 U/L (ref 0–35)
AST: 17 U/L (ref 0–37)
Albumin: 3.7 g/dL (ref 3.5–5.2)
CO2: 18 mEq/L — ABNORMAL LOW (ref 19–32)
Calcium: 8.5 mg/dL (ref 8.4–10.5)
Chloride: 103 mEq/L (ref 96–112)
Creat: 0.76 mg/dL (ref 0.50–1.10)
GFR, Est African American: >89 mL/min
Potassium: 4.3 mEq/L (ref 3.5–5.3)
Sodium: 133 mEq/L — ABNORMAL LOW (ref 135–145)
Total Protein: 6.4 g/dL (ref 6.0–8.3)

## 2011-08-10 LAB — T-HELPER CELL (CD4) - (RCID CLINIC ONLY): CD4 T Cell Abs: 290 uL — ABNORMAL LOW (ref 400–2700)

## 2011-08-15 LAB — HIV-1 RNA QUANT-NO REFLEX-BLD
HIV 1 RNA Quant: <20 copies/mL (ref <20)
HIV-1 RNA Quant, Log: <1.3 {Log} (ref <1.30)

## 2011-08-23 ENCOUNTER — Encounter: Payer: Self-pay | Admitting: Internal Medicine

## 2011-08-23 ENCOUNTER — Ambulatory Visit (INDEPENDENT_AMBULATORY_CARE_PROVIDER_SITE_OTHER): Payer: Medicare Other | Admitting: Internal Medicine

## 2011-08-23 DIAGNOSIS — M25552 Pain in left hip: Secondary | ICD-10-CM

## 2011-08-23 DIAGNOSIS — M25559 Pain in unspecified hip: Secondary | ICD-10-CM

## 2011-08-23 DIAGNOSIS — B20 Human immunodeficiency virus [HIV] disease: Secondary | ICD-10-CM

## 2011-08-23 DIAGNOSIS — Z113 Encounter for screening for infections with a predominantly sexual mode of transmission: Secondary | ICD-10-CM

## 2011-08-23 MED ORDER — GABAPENTIN 300 MG PO CAPS: 300.0000 mg | ORAL_CAPSULE | Freq: Every day | ORAL | Status: DC

## 2011-08-23 NOTE — Patient Instructions (Signed)
We will schedule a Physical therapy appointment

## 2011-08-23 NOTE — Assessment & Plan Note (Signed)
She is doing very well on her regimen and remains undetectable and obviously taking her Atripla daily.  I discussed the good findings and encouraged her to continue.  She is up to date with her PAP and will defer lipid management to her PCP.  She can follow up in 4 months.  She was reminded of condom use.

## 2011-08-23 NOTE — Progress Notes (Signed)
  Subjective:    Patient ID: Susan [name], female    DOB: [birth year], 50 y.o.   MRN: [id number]  HPI 50 yo female with HIV here for routine follow up.  She continues to take Atripla without missed doses.  She had previously been on it and stopped from drug abuse.  She now continues to remain clean and very compliant.  No recent illesses.  Just had a normal PAP from her primary doctor.  Complains of pain on her right side, sharp, nerve like pain, runs down the leg.     Review of Systems  Constitutional: Negative for fever, chills, activity change and fatigue.  HENT: Negative for sore throat.   Cardiovascular: Negative for leg swelling.  Gastrointestinal: Negative for nausea and diarrhea.  Genitourinary: Negative for dysuria, urgency and frequency.  Musculoskeletal: Negative for myalgias and arthralgias.       Leg pain  Skin: Negative for rash.  Hematological: Negative for adenopathy.  Psychiatric/Behavioral: Negative for dysphoric mood.       Objective:   Physical Exam  Constitutional: She is oriented to person, place, and time. She appears well-developed and well-nourished. No distress.       Morbidly obese  HENT:  Mouth/Throat: Oropharynx is clear and moist. No oropharyngeal exudate.  Cardiovascular: Normal rate, regular rhythm and normal heart sounds.  Exam reveals no gallop and no friction rub.   No murmur heard. Pulmonary/Chest: Effort normal and breath sounds normal. No respiratory distress. She has no wheezes. She has no rales.  Abdominal: Soft. Bowel sounds are normal. There is no tenderness. There is no rebound.  Musculoskeletal: Normal range of motion.  Lymphadenopathy:    She has no cervical adenopathy.  Neurological: She is alert and oriented to person, place, and time.  Skin: Skin is warm and dry. No rash noted. No erythema.          Assessment & Plan:

## 2011-08-29 ENCOUNTER — Ambulatory Visit: Payer: Medicare Other

## 2011-09-11 ENCOUNTER — Ambulatory Visit: Payer: Medicare Other

## 2011-10-01 ENCOUNTER — Telehealth: Payer: Self-pay | Admitting: Internal Medicine

## 2011-10-01 NOTE — Telephone Encounter (Signed)
Called [employer] about her metformin ER Tab 1000MG  - she received a 31 day of supply.  Must have prior authorization.  Requested prior auth  They will either fax [place] or call back with the results.

## 2011-10-22 ENCOUNTER — Other Ambulatory Visit: Payer: Self-pay | Admitting: *Deleted

## 2011-10-22 MED ORDER — ARIPIPRAZOLE 30 MG PO TABS: 30.0000 mg | ORAL_TABLET | Freq: Every day | ORAL | Status: DC

## 2011-10-22 NOTE — Telephone Encounter (Signed)
When she calls back, ask her if she is seeing someone at mental health.  They should be following her abilify

## 2011-11-01 ENCOUNTER — Ambulatory Visit: Payer: Medicare Other | Admitting: Internal Medicine

## 2011-11-26 ENCOUNTER — Ambulatory Visit: Payer: Medicare Other | Admitting: Internal Medicine

## 2011-11-26 DIAGNOSIS — Z0289 Encounter for other administrative examinations: Secondary | ICD-10-CM

## 2011-11-27 ENCOUNTER — Other Ambulatory Visit: Payer: Self-pay | Admitting: *Deleted

## 2011-11-27 DIAGNOSIS — Z113 Encounter for screening for infections with a predominantly sexual mode of transmission: Secondary | ICD-10-CM

## 2011-11-28 ENCOUNTER — Other Ambulatory Visit: Payer: Self-pay | Admitting: *Deleted

## 2011-11-28 DIAGNOSIS — Z113 Encounter for screening for infections with a predominantly sexual mode of transmission: Secondary | ICD-10-CM

## 2012-01-09 ENCOUNTER — Other Ambulatory Visit (HOSPITAL_COMMUNITY)
Admission: RE | Admit: 2012-01-09 | Discharge: 2012-01-09 | Disposition: A | Discharge status: Home / Self Care | Payer: Medicare Other | Source: Ambulatory Visit | Attending: Internal Medicine | Admitting: Internal Medicine

## 2012-01-09 ENCOUNTER — Other Ambulatory Visit: Payer: Medicare Other

## 2012-01-09 DIAGNOSIS — B20 Human immunodeficiency virus [HIV] disease: Secondary | ICD-10-CM

## 2012-01-09 DIAGNOSIS — Z113 Encounter for screening for infections with a predominantly sexual mode of transmission: Secondary | ICD-10-CM

## 2012-01-09 LAB — COMPREHENSIVE METABOLIC PANEL
Albumin: 3.8 g/dL (ref 3.5–5.2)
Alkaline Phosphatase: 147 U/L — ABNORMAL HIGH (ref 39–117)
BUN: 11 mg/dL (ref 6–23)
CO2: 23 mEq/L (ref 19–32)
Calcium: 8.8 mg/dL (ref 8.4–10.5)
Chloride: 102 mEq/L (ref 96–112)
Glucose, Bld: 314 mg/dL — ABNORMAL HIGH (ref 70–99)
Potassium: 4.1 mEq/L (ref 3.5–5.3)
Sodium: 137 mEq/L (ref 135–145)
Total Protein: 6.6 g/dL (ref 6.0–8.3)

## 2012-01-09 LAB — CBC WITH DIFFERENTIAL/PLATELET
Eosinophils Absolute: 0.2 10*3/uL (ref 0.0–0.7)
Lymphs Abs: 1.7 10*3/uL (ref 0.7–4.0)
MCH: 30.3 pg (ref 26.0–34.0)
Neutro Abs: 6.5 10*3/uL (ref 1.7–7.7)
Neutrophils Relative %: 72 % (ref 43–77)
Platelets: 247 10*3/uL (ref 150–400)
RBC: 4.69 MIL/uL (ref 3.87–5.11)
WBC: 8.9 10*3/uL (ref 4.0–10.5)

## 2012-01-10 LAB — T-HELPER CELL (CD4) - (RCID CLINIC ONLY): CD4 T Cell Abs: 270 uL — ABNORMAL LOW (ref 400–2700)

## 2012-01-15 ENCOUNTER — Other Ambulatory Visit: Payer: Self-pay | Admitting: *Deleted

## 2012-01-15 DIAGNOSIS — B20 Human immunodeficiency virus [HIV] disease: Secondary | ICD-10-CM

## 2012-01-15 DIAGNOSIS — I10 Essential (primary) hypertension: Secondary | ICD-10-CM

## 2012-01-15 MED ORDER — CLONIDINE HCL 0.1 MG PO TABS: 0.1000 mg | ORAL_TABLET | Freq: Two times a day (BID) | ORAL | Status: DC

## 2012-01-15 MED ORDER — CITALOPRAM HYDROBROMIDE 20 MG PO TABS: 20.0000 mg | ORAL_TABLET | Freq: Every day | ORAL | Status: DC

## 2012-01-15 MED ORDER — GABAPENTIN 300 MG PO CAPS: 300.0000 mg | ORAL_CAPSULE | Freq: Every day | ORAL | Status: DC

## 2012-01-15 MED ORDER — ARIPIPRAZOLE 30 MG PO TABS: 30.0000 mg | ORAL_TABLET | Freq: Every day | ORAL | Status: DC

## 2012-01-15 MED ORDER — TRAZODONE HCL 150 MG PO TABS: 150.0000 mg | ORAL_TABLET | Freq: Every day | ORAL | Status: DC

## 2012-01-15 MED ORDER — EFAVIRENZ-EMTRICITAB-TENOFOVIR 600-200-300 MG PO TABS: 1.0000 | ORAL_TABLET | Freq: Every day | ORAL | Status: DC

## 2012-01-15 NOTE — Telephone Encounter (Signed)
I spoke with pt about keeping her appt here on the 22nd. I urged her to call internal medicine & make an appt there. Needs f/u . I explained that med refills hould all go thru Dr. Greg Cutter except for the HIV drugs. States she will call them

## 2012-01-22 ENCOUNTER — Telehealth: Payer: Self-pay | Admitting: *Deleted

## 2012-01-22 NOTE — Telephone Encounter (Signed)
Called patient to remind her of appt with Dr. Luciana Axe for tomorrow. Wendall Mola CMA

## 2012-01-23 ENCOUNTER — Encounter: Payer: Self-pay | Admitting: Internal Medicine

## 2012-01-23 ENCOUNTER — Ambulatory Visit (INDEPENDENT_AMBULATORY_CARE_PROVIDER_SITE_OTHER): Payer: Medicare Other | Admitting: Internal Medicine

## 2012-01-23 VITALS — BP 154/83 | HR 90 | Temp 98.0°F | Ht 63.0 in | Wt 317.0 lb

## 2012-01-23 DIAGNOSIS — B20 Human immunodeficiency virus [HIV] disease: Secondary | ICD-10-CM

## 2012-01-23 DIAGNOSIS — Z113 Encounter for screening for infections with a predominantly sexual mode of transmission: Secondary | ICD-10-CM

## 2012-01-23 DIAGNOSIS — I10 Essential (primary) hypertension: Secondary | ICD-10-CM

## 2012-01-23 MED ORDER — CLONIDINE HCL 0.1 MG PO TABS
0.2000 mg | ORAL_TABLET | Freq: Once | ORAL | Status: AC
  Administered 2012-01-23: 0.2 mg via ORAL

## 2012-01-23 NOTE — Patient Instructions (Signed)
Follow up with your PCP ASAP.

## 2012-01-23 NOTE — Assessment & Plan Note (Signed)
She is notably significantly hypertensive. We have discussed with her primary physician's office who has put her in for an appointment for tomorrow morning. I emphatically encouraged patient to make that appointment. I appreciate the quick response of her primary physician to help with this difficult patient.

## 2012-01-23 NOTE — Progress Notes (Signed)
  Subjective:    Patient ID: Susan [name], female    DOB: 1961-01-13, 50 y.o.   MRN: [id number]  HPI She comes in here for followup of her HIV. She has been on Atripla and since coming here has remained undetectable.  Unfortunately, her CD4 count has not risen much above her current level of 270 but she remains asymptomatic from an HIV standpoint. She has had a continued undetectable viral load so suspect her immune reconstitution will not be much more than it is at this point. However her immune system is good and she is not at risk of any opportunistic infections.  Unfortunately, she does have severely elevated blood pressure. There is no headache or other associated deficits. She does have this history of stroke and concern with her continued elevated blood pressure. She has not followed up with her primary physician since last seen about 6 months ago. She states she has had transportation difficulties. She also complains of pain in her joints including her ankles knees and hips. This has been unrelieved by her Flexeril. She does tell me she continues to take her blood pressure medicines though is not sure of their names. She has not been taking clonidine twice a day. She does take her Atripla daily with no missed doses as is also evidenced by her undetectable viral load.   Review of Systems  Constitutional: Negative.   HENT: Positive for congestion and rhinorrhea. Negative for sore throat and trouble swallowing.   Respiratory: Positive for cough. Negative for shortness of breath and wheezing.   Cardiovascular: Negative for chest pain, palpitations and leg swelling.  Gastrointestinal: Negative for nausea, vomiting, abdominal pain and diarrhea.  Musculoskeletal: Negative for myalgias, joint swelling and arthralgias.  Skin: Negative for pallor and rash.  Neurological: Negative for dizziness, facial asymmetry, weakness, light-headedness, numbness and headaches.  Hematological: Negative for  adenopathy.  Psychiatric/Behavioral: Positive for sleep disturbance. Negative for self-injury and dysphoric mood.       Sleep disturbance due to pain in her joints       Objective:   Physical Exam  Constitutional: She appears well-developed and well-nourished. No distress.       Morbidly obese  Cardiovascular: Normal rate, regular rhythm and normal heart sounds.  Exam reveals no gallop and no friction rub.   No murmur heard. Pulmonary/Chest: Effort normal and breath sounds normal. No respiratory distress. She has no wheezes. She has no rales.       Some congestion noted in lungs cleared after coughing  Abdominal: Soft. Bowel sounds are normal. She exhibits no distension. There is no tenderness. There is no rebound.  Lymphadenopathy:    She has no cervical adenopathy.  Skin: Skin is warm and dry. No rash noted.  Psychiatric: She has a normal mood and affect. Her behavior is normal.          Assessment & Plan:

## 2012-01-24 ENCOUNTER — Ambulatory Visit (INDEPENDENT_AMBULATORY_CARE_PROVIDER_SITE_OTHER): Payer: Medicare Other | Admitting: Internal Medicine

## 2012-01-24 ENCOUNTER — Encounter: Payer: Self-pay | Admitting: Internal Medicine

## 2012-01-24 ENCOUNTER — Other Ambulatory Visit (INDEPENDENT_AMBULATORY_CARE_PROVIDER_SITE_OTHER): Payer: Medicare Other

## 2012-01-24 VITALS — BP 140/92 | HR 82 | Temp 98.7°F | Ht 63.0 in | Wt 317.4 lb

## 2012-01-24 DIAGNOSIS — E1142 Type 2 diabetes mellitus with diabetic polyneuropathy: Secondary | ICD-10-CM

## 2012-01-24 DIAGNOSIS — E1149 Type 2 diabetes mellitus with other diabetic neurological complication: Secondary | ICD-10-CM

## 2012-01-24 DIAGNOSIS — E119 Type 2 diabetes mellitus without complications: Secondary | ICD-10-CM

## 2012-01-24 DIAGNOSIS — I10 Essential (primary) hypertension: Secondary | ICD-10-CM

## 2012-01-24 LAB — HEMOGLOBIN A1C: Hgb A1c MFr Bld: 9.5 % — ABNORMAL HIGH (ref 4.6–6.5)

## 2012-01-24 MED ORDER — GABAPENTIN 300 MG PO CAPS: 300.0000 mg | ORAL_CAPSULE | Freq: Three times a day (TID) | ORAL | Status: DC

## 2012-01-24 MED ORDER — CLONIDINE HCL 0.2 MG PO TABS: 0.2000 mg | ORAL_TABLET | Freq: Two times a day (BID) | ORAL | Status: DC

## 2012-01-24 MED ORDER — AMLODIPINE BESYLATE 5 MG PO TABS: 5.0000 mg | ORAL_TABLET | Freq: Every day | ORAL | Status: DC

## 2012-01-24 NOTE — Assessment & Plan Note (Signed)
Dx 10/2010 in [place] during hospitalization for HONK - on insulin +met until 01/2011 - now metformin only Suboptimal control per review of home cbg log  Check A1c now and adjust medications as needed - januvia + metformin, consider edo refer if still uncontrolled Encouraged efforts at weight loss as ongoing with diet and exercise On ACE, added statin and ASA 04/2011, esp with TIA hx 2010 Lab Results  Component Value Date   HGBA1C 11.2* 08/02/2011

## 2012-01-24 NOTE — Assessment & Plan Note (Signed)
subop control - addition of clonidine by ID 03/2011, will increase dose now increased ACEI to max dose 04/2011-  Avoid increase beta-blocker dose due to borderline bradycardia and asthma Also increase dose amlodipine now Continue follow up and titration, esp with TIA hx and other co morbities May also improve with diet/exercise and weight loss BP Readings from Last 3 Encounters:  01/24/12 140/92  01/23/12 154/83  08/23/11 165/85

## 2012-01-24 NOTE — Patient Instructions (Signed)
It was good to see you today. Increase amlodipine and clonidine dose in addition to ongoing medications for blood pressure Increase gabapentin for pain symptoms  Your prescription(s) have been submitted to your pharmacy. Please take as directed and contact our office if you believe you are having problem(s) with the medication(s). Test(s) ordered today. Your results will be called to you after review (48-72hours after test completion). If any changes need to be made, you will be notified at that time.  If diabetes mellitus still uncontrolled, will refer to endo for consideration of insulin again Work on lifestyle changes as discussed (low fat, low carb, increased protein diet; improved exercise efforts; weight loss) to control sugar, blood pressure and cholesterol levels and/or reduce risk of developing other medical problems. Look into [email or web address] or other type of food journal to assist you in this process. Please schedule followup in 3 months for blood pressure and diabetes check, call sooner if problems.

## 2012-01-24 NOTE — Assessment & Plan Note (Signed)
Wt Readings from Last 3 Encounters:  01/24/12 317 lb 6.4 oz (143.972 kg)  01/23/12 317 lb (143.79 kg)  08/23/11 311 lb 8 oz (141.295 kg)   Reviewed need for weight reduction to optimize health status - pt reports working on same with improved diet/exercise habits Trial phentermine 04/2011 - little change
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
## 2012-01-25 ENCOUNTER — Telehealth: Payer: Self-pay

## 2012-01-25 NOTE — Telephone Encounter (Signed)
I called patient and she stated someone has already contacted her about results.

## 2012-01-25 NOTE — Progress Notes (Signed)
Addended by: Rene Paci A on: 01/25/2012 09:05 AM   Modules accepted: Orders

## 2012-01-25 NOTE — Telephone Encounter (Signed)
Message copied by Maurice Small on Fri Jan 25, 2012  4:57 PM ------      Message from: Rock Nephew T      Created: Fri Jan 25, 2012  9:12 AM                   ----- Message -----         From: Newt Lukes, MD         Sent: 01/25/2012   9:05 AM           To: Deatra James, MA            a1c shows poor DM control - will refer to endo for med asst as discussed at OV - pls let pt know same - thanks

## 2012-02-11 ENCOUNTER — Encounter: Payer: Self-pay | Admitting: Endocrinology

## 2012-02-11 ENCOUNTER — Ambulatory Visit (INDEPENDENT_AMBULATORY_CARE_PROVIDER_SITE_OTHER): Payer: Medicare Other | Admitting: Endocrinology

## 2012-02-11 VITALS — BP 138/78 | HR 80 | Temp 98.1°F | Ht 63.0 in | Wt 312.0 lb

## 2012-02-11 DIAGNOSIS — E1142 Type 2 diabetes mellitus with diabetic polyneuropathy: Secondary | ICD-10-CM

## 2012-02-11 DIAGNOSIS — E1149 Type 2 diabetes mellitus with other diabetic neurological complication: Secondary | ICD-10-CM

## 2012-02-11 MED ORDER — GLIMEPIRIDE 4 MG PO TABS: 4.0000 mg | ORAL_TABLET | Freq: Every day | ORAL | Status: DC

## 2012-02-11 NOTE — Patient Instructions (Addendum)
good diet and exercise habits significanly improve the control of your diabetes.  please let me know if you wish to be referred to a dietician.  high blood sugar is very risky to your health.  you should see an eye doctor every year. controlling your blood pressure and cholesterol drastically reduces the damage diabetes does to your body.  this also applies to quitting smoking.  please discuss these with your doctor.  you should take an aspirin every day, unless you have been advised by a doctor not to. check your blood sugar once a day.  vary the time of day when you check, between before the 3 meals, and at bedtime.  also check if you have symptoms of your blood sugar being too high or too low.  please keep a record of the readings and bring it to your next appointment here.  please call us sooner if your blood sugar goes below 70, or if it stays over 200.  Add glimepiride.  i have sent a prescription to your pharmacy we will need to take this complex situation in stages.  We can add additional pills if necessary. Please come back for a follow-up appointment in 3 weeks.
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
## 2012-02-13 DIAGNOSIS — E119 Type 2 diabetes mellitus without complications: Secondary | ICD-10-CM | POA: Insufficient documentation

## 2012-02-17 ENCOUNTER — Other Ambulatory Visit: Payer: Self-pay | Admitting: Internal Medicine

## 2012-02-18 ENCOUNTER — Other Ambulatory Visit: Payer: Self-pay | Admitting: *Deleted

## 2012-02-18 DIAGNOSIS — B20 Human immunodeficiency virus [HIV] disease: Secondary | ICD-10-CM

## 2012-02-18 MED ORDER — CITALOPRAM HYDROBROMIDE 20 MG PO TABS: 20.0000 mg | ORAL_TABLET | Freq: Every day | ORAL | Status: DC

## 2012-02-18 MED ORDER — EFAVIRENZ-EMTRICITAB-TENOFOVIR 600-200-300 MG PO TABS: 1.0000 | ORAL_TABLET | Freq: Every day | ORAL | Status: DC

## 2012-02-18 MED ORDER — METFORMIN HCL ER (OSM) 1000 MG PO TB24: 1000.0000 mg | ORAL_TABLET | Freq: Two times a day (BID) | ORAL | Status: DC

## 2012-02-19 ENCOUNTER — Telehealth: Payer: Self-pay | Admitting: Internal Medicine

## 2012-02-19 MED ORDER — FLUCONAZOLE 150 MG PO TABS: 150.0000 mg | ORAL_TABLET | Freq: Once | ORAL | Status: AC

## 2012-02-19 NOTE — Telephone Encounter (Signed)
Pt advised.

## 2012-02-19 NOTE — Telephone Encounter (Signed)
Caller: Seana/Mother; Phone Number: [phone]; Message from caller: Pt has another yeast infection.  She was in the ED with the last one 3 months ago.  Pt does not want an appt.  Her request is if MD would call in Diflucan without an appt.  Pt uses [facility].

## 2012-02-19 NOTE — Telephone Encounter (Signed)
diflucan 

## 2012-02-20 ENCOUNTER — Other Ambulatory Visit: Payer: Self-pay | Admitting: *Deleted

## 2012-02-20 DIAGNOSIS — F329 Major depressive disorder, single episode, unspecified: Secondary | ICD-10-CM

## 2012-02-20 MED ORDER — ARIPIPRAZOLE 30 MG PO TABS: 30.0000 mg | ORAL_TABLET | Freq: Every day | ORAL | Status: DC

## 2012-03-03 ENCOUNTER — Ambulatory Visit: Payer: Medicare Other | Admitting: Endocrinology

## 2012-03-03 DIAGNOSIS — Z0289 Encounter for other administrative examinations: Secondary | ICD-10-CM

## 2012-04-08 ENCOUNTER — Encounter: Payer: Self-pay | Admitting: *Deleted

## 2012-04-08 ENCOUNTER — Telehealth: Payer: Self-pay | Admitting: *Deleted

## 2012-04-08 NOTE — Telephone Encounter (Signed)
Unable to leave message on phone.  Will mail the pt a letter.

## 2012-04-14 ENCOUNTER — Other Ambulatory Visit: Payer: Self-pay | Admitting: *Deleted

## 2012-04-14 DIAGNOSIS — B20 Human immunodeficiency virus [HIV] disease: Secondary | ICD-10-CM

## 2012-04-14 MED ORDER — CITALOPRAM HYDROBROMIDE 20 MG PO TABS: 20.0000 mg | ORAL_TABLET | Freq: Every day | ORAL | Status: DC

## 2012-04-14 MED ORDER — CYCLOBENZAPRINE HCL 5 MG PO TABS: 5.0000 mg | ORAL_TABLET | Freq: Two times a day (BID) | ORAL | Status: DC | PRN

## 2012-05-01 ENCOUNTER — Ambulatory Visit: Payer: Medicare Other | Admitting: Internal Medicine

## 2012-05-13 ENCOUNTER — Ambulatory Visit (INDEPENDENT_AMBULATORY_CARE_PROVIDER_SITE_OTHER): Payer: Medicare Other | Admitting: Internal Medicine

## 2012-05-13 ENCOUNTER — Encounter: Payer: Self-pay | Admitting: Internal Medicine

## 2012-05-13 VITALS — BP 140/92 | HR 64 | Temp 98.2°F | Ht 63.0 in | Wt 303.1 lb

## 2012-05-13 DIAGNOSIS — K219 Gastro-esophageal reflux disease without esophagitis: Secondary | ICD-10-CM

## 2012-05-13 DIAGNOSIS — E1142 Type 2 diabetes mellitus with diabetic polyneuropathy: Secondary | ICD-10-CM

## 2012-05-13 DIAGNOSIS — N921 Excessive and frequent menstruation with irregular cycle: Secondary | ICD-10-CM

## 2012-05-13 DIAGNOSIS — E785 Hyperlipidemia, unspecified: Secondary | ICD-10-CM | POA: Insufficient documentation

## 2012-05-13 DIAGNOSIS — E1149 Type 2 diabetes mellitus with other diabetic neurological complication: Secondary | ICD-10-CM

## 2012-05-13 DIAGNOSIS — Z23 Encounter for immunization: Secondary | ICD-10-CM

## 2012-05-13 DIAGNOSIS — N920 Excessive and frequent menstruation with regular cycle: Secondary | ICD-10-CM

## 2012-05-13 MED ORDER — NAPROXEN 500 MG PO TABS: 500.0000 mg | ORAL_TABLET | Freq: Three times a day (TID) | ORAL | Status: DC

## 2012-05-13 MED ORDER — OMEPRAZOLE 20 MG PO CPDR: 20.0000 mg | DELAYED_RELEASE_CAPSULE | Freq: Every day | ORAL | Status: DC

## 2012-05-13 NOTE — Assessment & Plan Note (Signed)
Dx 10/2010 in [place] during hospitalization for HONK - on insulin +met until 01/2011 -then metformin only Now working with endo: amaryl, januvia + metformin Encouraged efforts at weight loss as ongoing with diet and exercise On ACE, added statin and ASA 04/2011, esp with TIA hx 2010 Lab Results  Component Value Date   HGBA1C 9.5* 01/24/2012

## 2012-05-13 NOTE — Assessment & Plan Note (Signed)
On simva Check lipids annually and titrate as needed 

## 2012-05-13 NOTE — Patient Instructions (Signed)
It was good to see you today. Naprosyn as needed for cramping period pain Start Prilosec for reflex and indigestion Your prescription(s) have been submitted to your pharmacy. Please take as directed and contact our office if you believe you are having problem(s) with the medication(s). we'll make referral to gynecology . Our office will contact you regarding appointment(s) once made. Test(s) ordered today for cholesterol. Your results will be called to you after review (48-72hours after test completion). If any changes need to be made, you will be notified at that time.  Continue working with your other specialists as ongoing  Continue to work on lifestyle changes as discussed (low fat, low carb, increased protein diet; improved exercise efforts; weight loss) to control sugar, blood pressure and cholesterol levels and/or reduce risk of developing other medical problems. Look into [email or web address] or other type of food journal to assist you in this process. Please schedule followup in 6 months for blood pressure and weight check, call sooner if problems.

## 2012-05-13 NOTE — Assessment & Plan Note (Signed)
Mild-mod indigestion >3x/week Start PPI now
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
## 2012-05-20 ENCOUNTER — Ambulatory Visit (INDEPENDENT_AMBULATORY_CARE_PROVIDER_SITE_OTHER): Payer: Medicare Other | Admitting: Endocrinology

## 2012-05-20 ENCOUNTER — Other Ambulatory Visit (INDEPENDENT_AMBULATORY_CARE_PROVIDER_SITE_OTHER): Payer: Medicare Other

## 2012-05-20 VITALS — BP 120/84 | HR 83 | Temp 98.6°F

## 2012-05-20 DIAGNOSIS — E1149 Type 2 diabetes mellitus with other diabetic neurological complication: Secondary | ICD-10-CM

## 2012-05-20 DIAGNOSIS — E1142 Type 2 diabetes mellitus with diabetic polyneuropathy: Secondary | ICD-10-CM

## 2012-05-20 LAB — HEMOGLOBIN A1C: Hgb A1c MFr Bld: 9.5 % — ABNORMAL HIGH (ref 4.6–6.5)

## 2012-05-20 MED ORDER — INSULIN GLARGINE 100 UNIT/ML ~~LOC~~ SOLN: 20.0000 [IU] | Freq: Every day | SUBCUTANEOUS | Status: DC
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
## 2012-05-20 NOTE — Patient Instructions (Addendum)
check your blood sugar once a day.  vary the time of day when you check, between before the 3 meals, and at bedtime.  also check if you have symptoms of your blood sugar being too high or too low.  please keep a record of the readings and bring it to your next appointment here.  please call us sooner if your blood sugar goes below 70, or if it stays over 200.  Please come back for a follow-up appointment in 1 month.   blood tests are being requested for you today.  You will receive a letter with results.

## 2012-05-22 ENCOUNTER — Telehealth: Payer: Self-pay | Admitting: *Deleted

## 2012-05-22 NOTE — Telephone Encounter (Signed)
Called pt to inform of lab results pt informed (letter also mailed to pt).

## 2012-05-29 ENCOUNTER — Telehealth: Payer: Self-pay | Admitting: Internal Medicine

## 2012-05-29 NOTE — Telephone Encounter (Signed)
Caller: [name]/Patient; Patient Name: Susan [name]; PCP: Rene Paci (Adults only); Best Callback Phone Number: [phone]; Reason for call: Other. Patient is calling concerning "yeast infection" onset yesterday 05/29/12. Itching and scratching. Beige discharge with no odor. Patient states she was in the office on 05/20/12 and started on Insulin.    Would like medication called in for treatment. Emergent s/sx ruled out per Vaginal Discharge or irritation Protocol with exception to "First time occurance of foul smelling or unusual vaginal discharge". See Provider in 24 hours. PATIENT REQUESTING CALL IN MEDICATION. Home care reviewed. Advised I would forward message to Provider for review. Caller expressed understanding.

## 2012-05-29 NOTE — Telephone Encounter (Signed)
Caller: Susan [name]/Patient; PCP: Rene Paci (Adults only); CB#: [phone]; Call regarding Calling for Yeast Infection RX; Patient caller earlier in the day 05/29/12 , requesting a medication for Yeast infection. She has not heard from the office. Triaged per Yeast Infection Protocol.   Advised patient she needed to be seen in 24 hours and I could schedule her an appointment. Caller declined . She wants Prescription only for Yeast infection. No changes since Triage done at 12:22 pm.  Advised i would forward message to the office.

## 2012-05-30 ENCOUNTER — Telehealth: Payer: Self-pay | Admitting: Internal Medicine

## 2012-05-30 NOTE — Telephone Encounter (Signed)
Ok for diflucan stat 150 mg. If her symptoms persist despite routine treatment will need ov for pelvic exam.

## 2012-05-30 NOTE — Telephone Encounter (Signed)
Caller: [name]/Patient; Patient Name: Susan [name]; PCP: Rene Paci (Adults only); Best Callback Phone Number: [phone] Patient is calling for medication for her yeast infection. She was triaged and assessed prior.  Message from physician today to call in medication Diflucan 150mg  X1 tablet, NR per Dr. Illene Regulus MD.  [facility] on [address]  [phone] and ordered medicaiton as directed.Spoke with Ronaldo Miyamoto Pharmacist.  Caller is aware if no improvement in symptoms to call back to office for evaluation/pelvic exam. Understanding expressed.

## 2012-05-31 ENCOUNTER — Emergency Department (HOSPITAL_COMMUNITY)
Admission: EM | Admit: 2012-05-31 | Discharge: 2012-06-01 | Disposition: A | Discharge status: Home / Self Care | Payer: Medicare Other | Attending: Emergency Medicine | Admitting: Emergency Medicine

## 2012-05-31 DIAGNOSIS — M129 Arthropathy, unspecified: Secondary | ICD-10-CM | POA: Insufficient documentation

## 2012-05-31 DIAGNOSIS — Z21 Asymptomatic human immunodeficiency virus [HIV] infection status: Secondary | ICD-10-CM | POA: Insufficient documentation

## 2012-05-31 DIAGNOSIS — Z79899 Other long term (current) drug therapy: Secondary | ICD-10-CM | POA: Insufficient documentation

## 2012-05-31 DIAGNOSIS — R45851 Suicidal ideations: Secondary | ICD-10-CM | POA: Insufficient documentation

## 2012-05-31 DIAGNOSIS — Z8673 Personal history of transient ischemic attack (TIA), and cerebral infarction without residual deficits: Secondary | ICD-10-CM | POA: Insufficient documentation

## 2012-05-31 DIAGNOSIS — K219 Gastro-esophageal reflux disease without esophagitis: Secondary | ICD-10-CM | POA: Insufficient documentation

## 2012-05-31 DIAGNOSIS — F259 Schizoaffective disorder, unspecified: Secondary | ICD-10-CM | POA: Insufficient documentation

## 2012-05-31 DIAGNOSIS — G40909 Epilepsy, unspecified, not intractable, without status epilepticus: Secondary | ICD-10-CM | POA: Insufficient documentation

## 2012-05-31 DIAGNOSIS — E7800 Pure hypercholesterolemia, unspecified: Secondary | ICD-10-CM | POA: Insufficient documentation

## 2012-05-31 DIAGNOSIS — F329 Major depressive disorder, single episode, unspecified: Secondary | ICD-10-CM | POA: Insufficient documentation

## 2012-05-31 DIAGNOSIS — E119 Type 2 diabetes mellitus without complications: Secondary | ICD-10-CM | POA: Insufficient documentation

## 2012-05-31 DIAGNOSIS — Z7982 Long term (current) use of aspirin: Secondary | ICD-10-CM | POA: Insufficient documentation

## 2012-05-31 DIAGNOSIS — Z794 Long term (current) use of insulin: Secondary | ICD-10-CM | POA: Insufficient documentation

## 2012-05-31 DIAGNOSIS — F3289 Other specified depressive episodes: Secondary | ICD-10-CM | POA: Insufficient documentation

## 2012-05-31 DIAGNOSIS — B20 Human immunodeficiency virus [HIV] disease: Secondary | ICD-10-CM

## 2012-05-31 DIAGNOSIS — R45850 Homicidal ideations: Secondary | ICD-10-CM | POA: Insufficient documentation

## 2012-05-31 DIAGNOSIS — I10 Essential (primary) hypertension: Secondary | ICD-10-CM | POA: Insufficient documentation

## 2012-05-31 DIAGNOSIS — J45909 Unspecified asthma, uncomplicated: Secondary | ICD-10-CM | POA: Insufficient documentation

## 2012-06-01 ENCOUNTER — Encounter (HOSPITAL_COMMUNITY): Payer: Self-pay | Admitting: Emergency Medicine

## 2012-06-01 LAB — COMPREHENSIVE METABOLIC PANEL
ALT: 16 U/L (ref 0–35)
CO2: 22 mEq/L (ref 19–32)
Calcium: 8.5 mg/dL (ref 8.4–10.5)
Creatinine, Ser: 0.61 mg/dL (ref 0.50–1.10)
GFR calc Af Amer: >90 mL/min (ref >90)
GFR calc non Af Amer: >90 mL/min (ref >90)
Glucose, Bld: 319 mg/dL — ABNORMAL HIGH (ref 70–99)
Sodium: 135 mEq/L (ref 135–145)
Total Protein: 6.7 g/dL (ref 6.0–8.3)

## 2012-06-01 LAB — SALICYLATE LEVEL: Salicylate Lvl: <2 mg/dL — ABNORMAL LOW (ref 2.8–20.0)

## 2012-06-01 LAB — CBC
Hemoglobin: 15.7 g/dL — ABNORMAL HIGH (ref 12.0–15.0)
MCH: 31.3 pg (ref 26.0–34.0)
MCHC: 35.8 g/dL (ref 30.0–36.0)
MCV: 87.5 fL (ref 78.0–100.0)
RBC: 5.02 MIL/uL (ref 3.87–5.11)

## 2012-06-01 LAB — ETHANOL: Alcohol, Ethyl (B): 136 mg/dL — ABNORMAL HIGH (ref 0–11)

## 2012-06-01 LAB — RAPID URINE DRUG SCREEN, HOSP PERFORMED
Cocaine: NOT DETECTED
Opiates: NOT DETECTED

## 2012-06-01 LAB — GLUCOSE, CAPILLARY: Glucose-Capillary: 324 mg/dL — ABNORMAL HIGH (ref 70–99)

## 2012-06-01 MED ORDER — ATENOLOL 25 MG PO TABS
25.0000 mg | ORAL_TABLET | Freq: Every day | ORAL | Status: DC
  Administered 2012-06-01: 25 mg via ORAL
  Filled 2012-06-01: qty 1

## 2012-06-01 MED ORDER — EFAVIRENZ-EMTRICITAB-TENOFOVIR 600-200-300 MG PO TABS
1.0000 | ORAL_TABLET | Freq: Every day | ORAL | Status: DC
  Filled 2012-06-01 (×2): qty 1

## 2012-06-01 MED ORDER — TRAZODONE HCL 50 MG PO TABS: 150.0000 mg | ORAL_TABLET | Freq: Every day | ORAL | Status: DC

## 2012-06-01 MED ORDER — QUETIAPINE FUMARATE 200 MG PO TABS: 100.0000 mg | ORAL_TABLET | Freq: Every day | ORAL | Status: DC

## 2012-06-01 MED ORDER — ZOLPIDEM TARTRATE 5 MG PO TABS: 5.0000 mg | ORAL_TABLET | Freq: Every evening | ORAL | Status: DC | PRN

## 2012-06-01 MED ORDER — GLIMEPIRIDE 4 MG PO TABS
4.0000 mg | ORAL_TABLET | Freq: Every day | ORAL | Status: DC
  Administered 2012-06-01: 4 mg via ORAL
  Filled 2012-06-01 (×2): qty 1

## 2012-06-01 MED ORDER — ALUM & MAG HYDROXIDE-SIMETH 200-200-20 MG/5ML PO SUSP: 30.0000 mL | ORAL | Status: DC | PRN

## 2012-06-01 MED ORDER — IBUPROFEN 200 MG PO TABS: 600.0000 mg | ORAL_TABLET | Freq: Three times a day (TID) | ORAL | Status: DC | PRN

## 2012-06-01 MED ORDER — CLONIDINE HCL 0.1 MG PO TABS
0.2000 mg | ORAL_TABLET | Freq: Two times a day (BID) | ORAL | Status: DC
  Administered 2012-06-01: 0.2 mg via ORAL
  Filled 2012-06-01: qty 2

## 2012-06-01 MED ORDER — INSULIN GLARGINE 100 UNIT/ML ~~LOC~~ SOLN
20.0000 [IU] | Freq: Every day | SUBCUTANEOUS | Status: DC
Start: 2012-06-01 — End: 2012-06-01

## 2012-06-01 MED ORDER — ONDANSETRON HCL 4 MG PO TABS: 4.0000 mg | ORAL_TABLET | Freq: Three times a day (TID) | ORAL | Status: DC | PRN

## 2012-06-01 MED ORDER — CITALOPRAM HYDROBROMIDE 20 MG PO TABS
20.0000 mg | ORAL_TABLET | Freq: Every day | ORAL | Status: DC
  Administered 2012-06-01: 20 mg via ORAL
  Filled 2012-06-01: qty 1

## 2012-06-01 MED ORDER — NAPROXEN 500 MG PO TABS
500.0000 mg | ORAL_TABLET | Freq: Three times a day (TID) | ORAL | Status: DC
  Administered 2012-06-01 (×3): 500 mg via ORAL
  Filled 2012-06-01 (×3): qty 1

## 2012-06-01 MED ORDER — ARIPIPRAZOLE 15 MG PO TABS
30.0000 mg | ORAL_TABLET | Freq: Every day | ORAL | Status: DC
  Administered 2012-06-01: 30 mg via ORAL
  Filled 2012-06-01: qty 2

## 2012-06-01 MED ORDER — GABAPENTIN 300 MG PO CAPS
300.0000 mg | ORAL_CAPSULE | Freq: Three times a day (TID) | ORAL | Status: DC
  Administered 2012-06-01 (×2): 300 mg via ORAL
  Filled 2012-06-01 (×3): qty 1

## 2012-06-01 MED ORDER — AMLODIPINE BESYLATE 5 MG PO TABS
5.0000 mg | ORAL_TABLET | Freq: Every day | ORAL | Status: DC
  Administered 2012-06-01: 5 mg via ORAL
  Filled 2012-06-01: qty 1

## 2012-06-01 MED ORDER — ACETAMINOPHEN 325 MG PO TABS: 650.0000 mg | ORAL_TABLET | ORAL | Status: DC | PRN

## 2012-06-01 MED ORDER — ENALAPRIL MALEATE 20 MG PO TABS
20.0000 mg | ORAL_TABLET | Freq: Every day | ORAL | Status: DC
  Administered 2012-06-01: 20 mg via ORAL
  Filled 2012-06-01: qty 1

## 2012-06-01 MED ORDER — ASPIRIN EC 81 MG PO TBEC
81.0000 mg | DELAYED_RELEASE_TABLET | Freq: Every day | ORAL | Status: DC
  Administered 2012-06-01: 81 mg via ORAL
  Filled 2012-06-01: qty 1

## 2012-06-01 MED ORDER — LORAZEPAM 1 MG PO TABS: 1.0000 mg | ORAL_TABLET | Freq: Three times a day (TID) | ORAL | Status: DC | PRN

## 2012-06-01 MED ORDER — INSULIN GLARGINE 100 UNIT/ML ~~LOC~~ SOLN
20.0000 [IU] | Freq: Every morning | SUBCUTANEOUS | Status: DC
  Administered 2012-06-01: 20 [IU] via SUBCUTANEOUS
  Filled 2012-06-01: qty 20
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
## 2012-06-01 NOTE — ED Notes (Signed)
Pt states she is very depressed lately, pt states she told her daughter today that she wanted to kill herself. Pt continues to feel this way, states she would take all her pills. Pt states she wished the cops would shoot her death. Pt also she would "take some people out" before she does it. Pt tearful and states she is overwhelmed.

## 2012-06-01 NOTE — ED Provider Notes (Signed)
Pt resting comfortably, nad. Discussed w act team. telepsych pending.   Suzi Roots, MD 06/01/12 [phone]

## 2012-06-01 NOTE — ED Provider Notes (Signed)
Dr. Trisha Mangle recommends d/c  Gerhard Munch, MD 06/01/12 1843

## 2012-06-01 NOTE — ED Notes (Signed)
Pt discharged to home. Left unit ambulating to checkout accompanied by family members, who are taking patient home. Left in good condition.

## 2012-06-01 NOTE — ED Provider Notes (Signed)
Medical screening examination/treatment/procedure(s) were performed by non-physician practitioner and as supervising physician I was immediately available for consultation/collaboration.   Eirene Rather, MD 06/01/12 0713 

## 2012-06-01 NOTE — ED Notes (Signed)
Patient is resting comfortably. 
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
## 2012-06-04 ENCOUNTER — Encounter: Payer: Medicare Other | Admitting: Family Medicine

## 2012-06-04 ENCOUNTER — Ambulatory Visit (INDEPENDENT_AMBULATORY_CARE_PROVIDER_SITE_OTHER): Payer: Medicare Other | Admitting: Family Medicine

## 2012-06-04 ENCOUNTER — Other Ambulatory Visit (HOSPITAL_COMMUNITY)
Admission: RE | Admit: 2012-06-04 | Discharge: 2012-06-04 | Disposition: A | Discharge status: Home / Self Care | Payer: Medicare Other | Source: Ambulatory Visit | Attending: Obstetrics & Gynecology | Admitting: Obstetrics & Gynecology

## 2012-06-04 ENCOUNTER — Encounter: Payer: Self-pay | Admitting: Family Medicine

## 2012-06-04 VITALS — BP 177/89 | HR 73 | Temp 98.6°F | Resp 20 | Ht 63.0 in | Wt 301.9 lb

## 2012-06-04 DIAGNOSIS — N949 Unspecified condition associated with female genital organs and menstrual cycle: Secondary | ICD-10-CM

## 2012-06-04 DIAGNOSIS — N920 Excessive and frequent menstruation with regular cycle: Secondary | ICD-10-CM | POA: Insufficient documentation

## 2012-06-04 DIAGNOSIS — R102 Pelvic and perineal pain: Secondary | ICD-10-CM

## 2012-06-04 DIAGNOSIS — N938 Other specified abnormal uterine and vaginal bleeding: Secondary | ICD-10-CM | POA: Insufficient documentation

## 2012-06-04 DIAGNOSIS — N921 Excessive and frequent menstruation with irregular cycle: Secondary | ICD-10-CM

## 2012-06-04 NOTE — Patient Instructions (Signed)
You may have spotting and cramping following the procedure. You may take ibuprofen or tylenol for pain.  Endometrial Biopsy This is a test in which a tissue sample (a biopsy) is taken from inside the uterus (womb). It is then looked at by a specialist under a microscope to see if the tissue is normal or abnormal. The endometrium is the lining of the uterus. This test helps determine where you are in your menstrual cycle and how hormone levels are affecting the lining of the uterus. Another use for this test is to diagnose endometrial cancer, tuberculosis, polyps, or inflammatory conditions and to evaluate uterine bleeding. PREPARATION FOR TEST No preparation or fasting is necessary. NORMAL FINDINGS No pathologic conditions. Presence of "secretory-type" endometrium 3 to 5 days before to normal menstruation. Ranges for normal findings may vary among different laboratories and hospitals. You should always check with your doctor after having lab work or other tests done to discuss the meaning of your test results and whether your values are considered within normal limits. MEANING OF TEST  Your caregiver will go over the test results with you and discuss the importance and meaning of your results, as well as treatment options and the need for additional tests if necessary. OBTAINING THE TEST RESULTS It is your responsibility to obtain your test results. Ask the lab or department performing the test when and how you will get your results. Document Released: 12/21/2004 Document Revised: 11/12/2011 Document Reviewed: 07/30/2008 [facility] Patient Information 2013 [place], [place].
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
## 2012-06-09 ENCOUNTER — Other Ambulatory Visit: Payer: Self-pay | Admitting: Licensed Clinical Social Worker

## 2012-06-09 DIAGNOSIS — F259 Schizoaffective disorder, unspecified: Secondary | ICD-10-CM

## 2012-06-09 DIAGNOSIS — F329 Major depressive disorder, single episode, unspecified: Secondary | ICD-10-CM

## 2012-06-09 MED ORDER — QUETIAPINE FUMARATE 200 MG PO TABS: 100.0000 mg | ORAL_TABLET | Freq: Every day | ORAL | Status: DC

## 2012-06-09 MED ORDER — ARIPIPRAZOLE 30 MG PO TABS: 30.0000 mg | ORAL_TABLET | Freq: Every day | ORAL | Status: DC

## 2012-06-09 MED ORDER — TRAZODONE HCL 150 MG PO TABS: 150.0000 mg | ORAL_TABLET | Freq: Every day | ORAL | Status: DC

## 2012-06-10 ENCOUNTER — Telehealth: Payer: Self-pay | Admitting: *Deleted

## 2012-06-10 DIAGNOSIS — N920 Excessive and frequent menstruation with regular cycle: Secondary | ICD-10-CM

## 2012-06-10 DIAGNOSIS — N949 Unspecified condition associated with female genital organs and menstrual cycle: Secondary | ICD-10-CM

## 2012-06-10 NOTE — Telephone Encounter (Signed)
Called pt regarding need for [place] to be completed prior to her return for follow up visit @ clinic. When pt answered the phone she stated that she was @ the grocery store and asked if I would call back tomorrow morning. I agreed.

## 2012-06-11 NOTE — Telephone Encounter (Signed)
I called Susan [name] and discussed the need for [place] appt prior to clinic appt on 10/23. I explained the procedure including the need to have a full bladder and Susan [name] agreed to appt on 06/17/12 @ 1030. Susan [name] voiced understanding.

## 2012-06-17 ENCOUNTER — Ambulatory Visit (HOSPITAL_COMMUNITY)
Admission: RE | Admit: 2012-06-17 | Discharge: 2012-06-17 | Disposition: A | Discharge status: Home / Self Care | Payer: Medicare Other | Source: Ambulatory Visit | Attending: Family Medicine | Admitting: Family Medicine

## 2012-06-17 DIAGNOSIS — D251 Intramural leiomyoma of uterus: Secondary | ICD-10-CM | POA: Insufficient documentation

## 2012-06-17 DIAGNOSIS — N921 Excessive and frequent menstruation with irregular cycle: Secondary | ICD-10-CM

## 2012-06-17 DIAGNOSIS — R102 Pelvic and perineal pain unspecified side: Secondary | ICD-10-CM

## 2012-06-17 DIAGNOSIS — N949 Unspecified condition associated with female genital organs and menstrual cycle: Secondary | ICD-10-CM | POA: Insufficient documentation

## 2012-06-17 DIAGNOSIS — N920 Excessive and frequent menstruation with regular cycle: Secondary | ICD-10-CM

## 2012-06-17 DIAGNOSIS — N938 Other specified abnormal uterine and vaginal bleeding: Secondary | ICD-10-CM

## 2012-06-17 IMAGING — US US TRANSVAGINAL NON-OB
1 series · 13 of 25 positions shown · non-contrast
Comparison: None

CLINICAL DATA: Pelvic pain.  Menometrorrhagia.  Dysfunctional
uterine bleeding.



[Series 1: us pelvis complete · 13 of 52 slices shown]
[im 1/52]
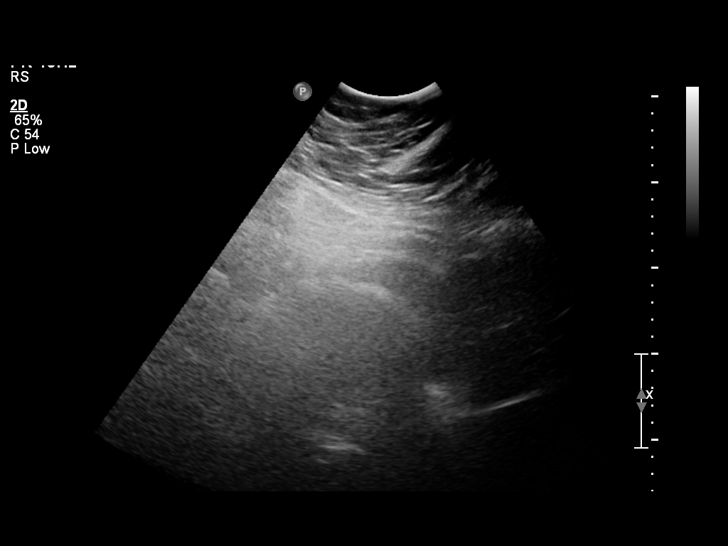
[im 5/52]
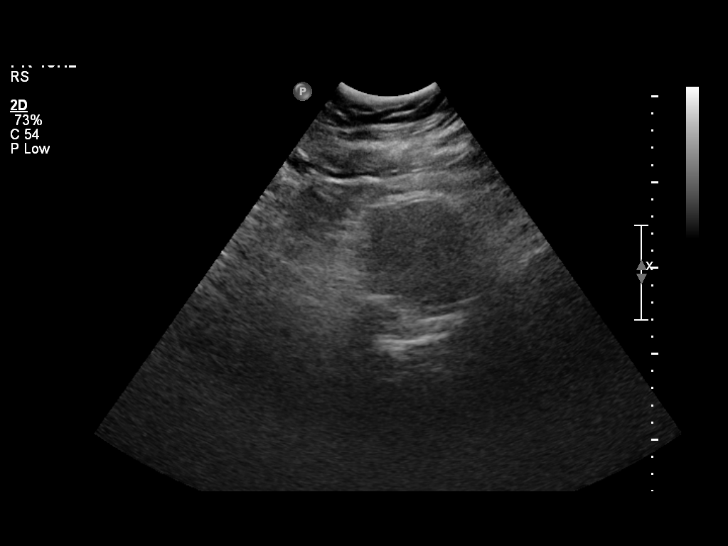
[im 9/52]
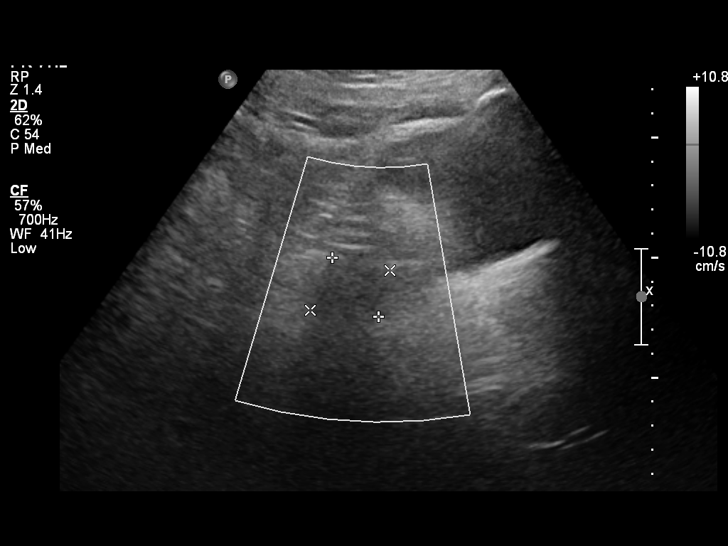
[im 13/52]
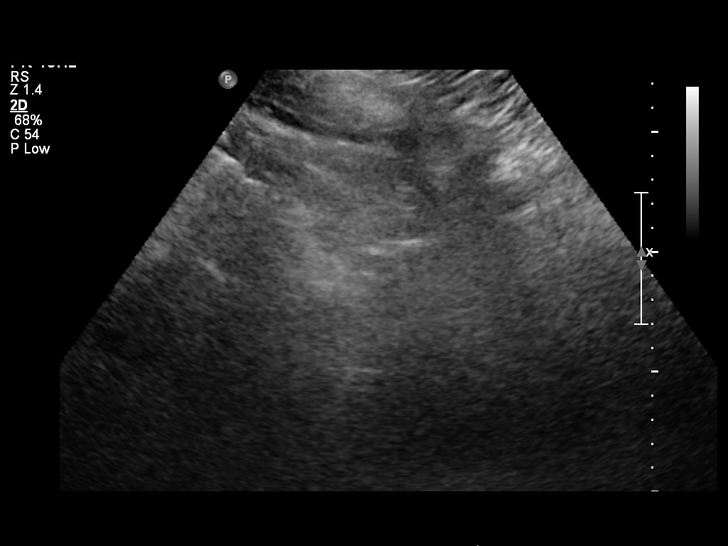
[im 18/52]
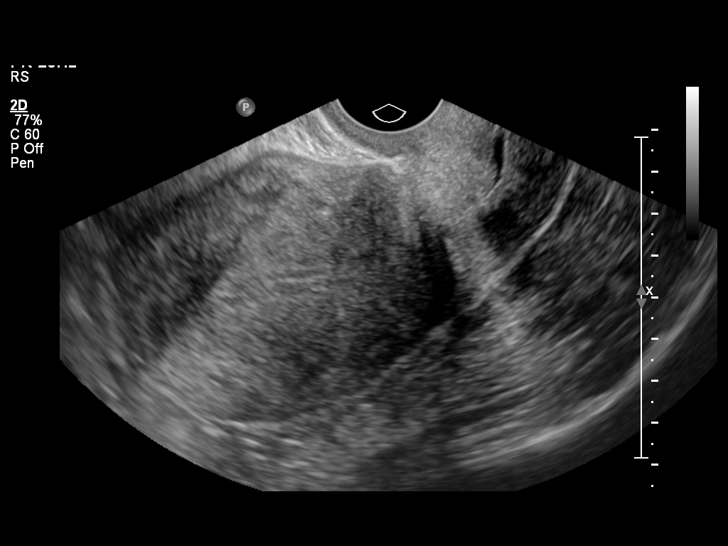
[im 22/52]
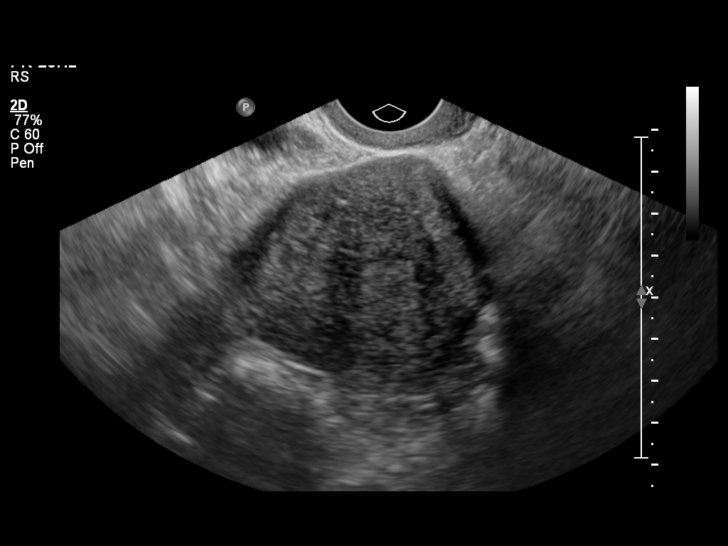
[im 26/52]
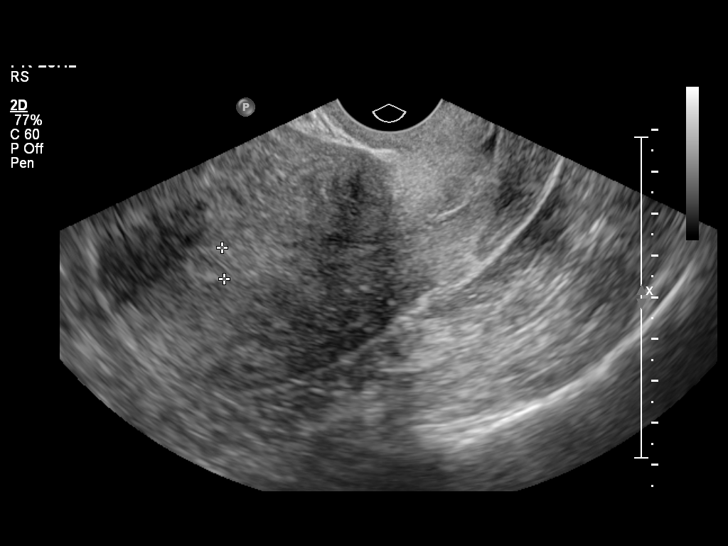
[im 30/52]
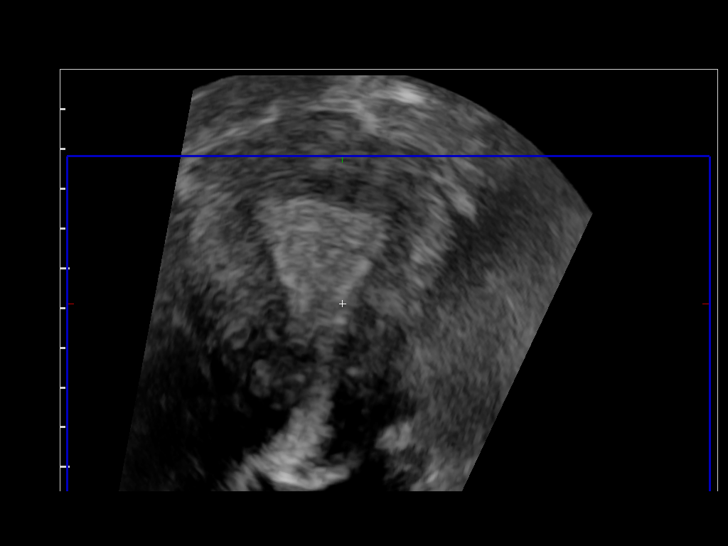
[im 35/52]
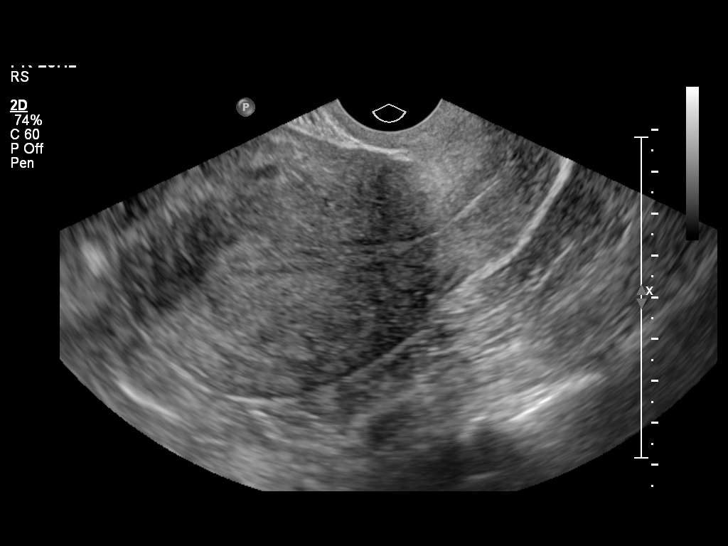
[im 39/52]
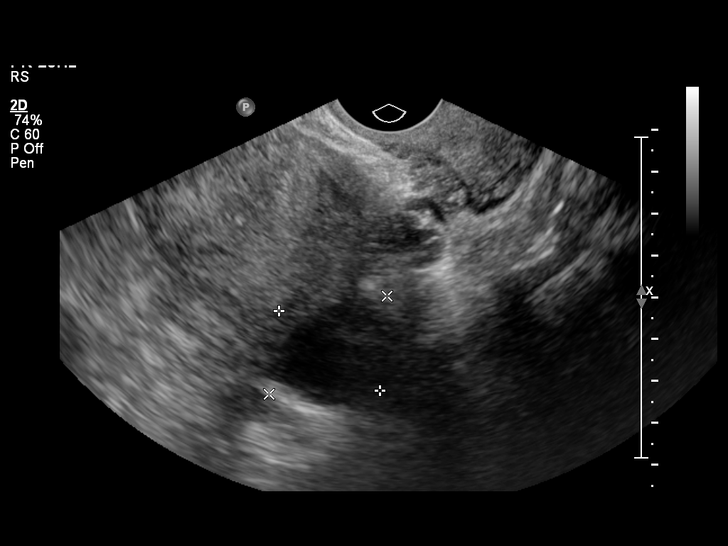
[im 43/52]
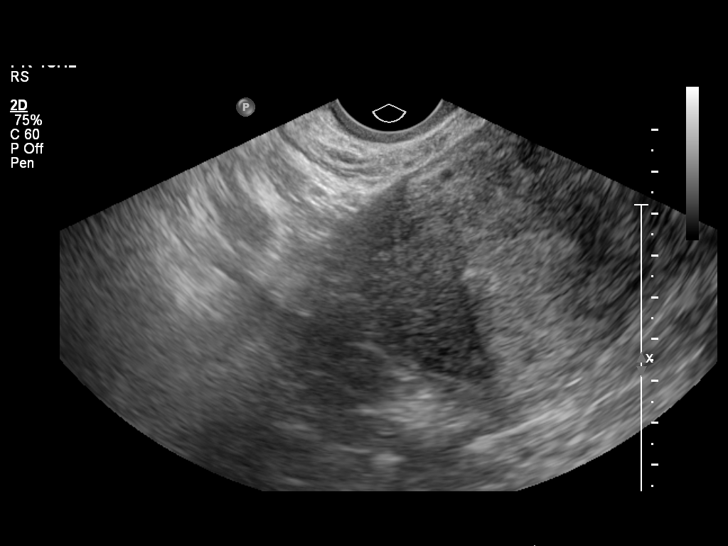
[im 47/52]
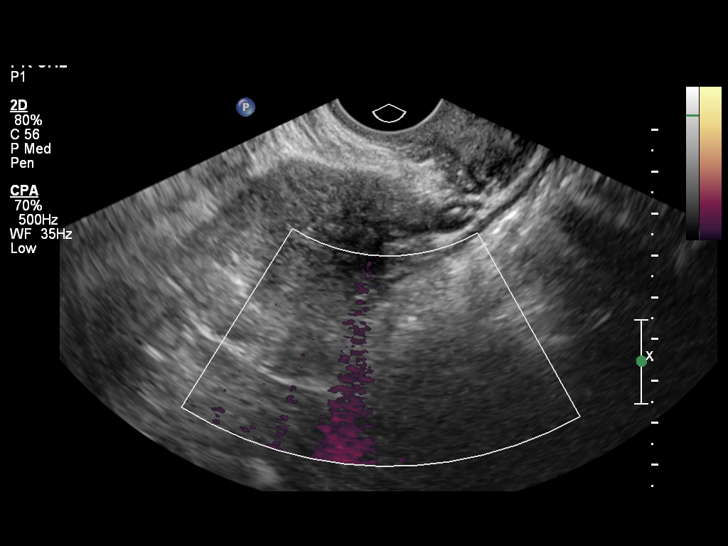
[im 52/52]
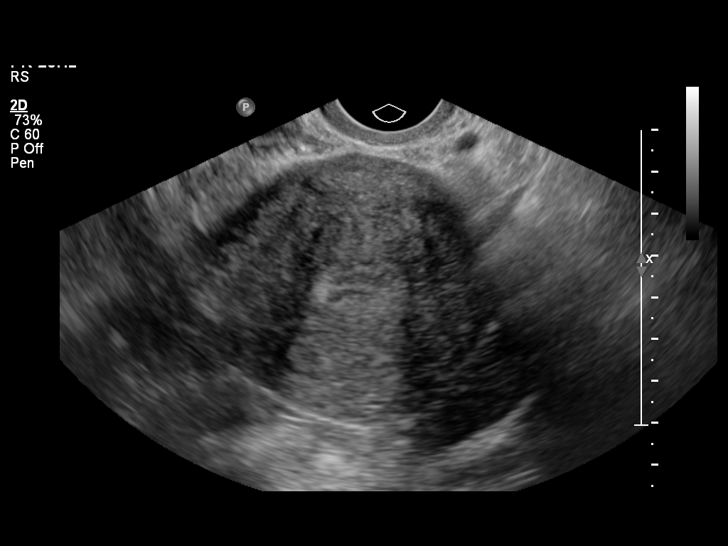

[13 of 25 positions shown; findings below may reference images not displayed]

FINDINGS: Uterus:  11.1 x 6.0 x 7.6 cm.  Heterogeneous echogenicity of the
uterine myometrium is noted.  A probable intramural fibroid is seen
in the anterior uterine corpus which measures 3.2 cm in maximum
diameter.  No other fibroids visualized.

Endometrium: Double layer thickness measures 8 mm transvaginally.
Trilayered appearance.  No focal lesion visualized.

Right ovary: 2.4 x 2.0 x 2.5 cm.  Normal appearance.

Left ovary: 3.1 x 3.7 x 4.0 cm.  A dominant follicle is seen
measuring 2.6 cm.  Normal appearance.  No adnexal mass identified.

Other Findings:  No free fluid
IMPRESSION: 1.  Heterogeneous uterine myometrium, with 3.2 cm fibroid in the
anterior corpus.
2.  Normal ovaries.  No adnexal mass identified.

## 2012-06-24 ENCOUNTER — Ambulatory Visit: Payer: Medicare Other | Admitting: Endocrinology

## 2012-06-24 DIAGNOSIS — Z0289 Encounter for other administrative examinations: Secondary | ICD-10-CM

## 2012-06-25 ENCOUNTER — Encounter: Payer: Self-pay | Admitting: Obstetrics & Gynecology

## 2012-06-25 ENCOUNTER — Ambulatory Visit (INDEPENDENT_AMBULATORY_CARE_PROVIDER_SITE_OTHER): Payer: Medicare Other | Admitting: Obstetrics & Gynecology

## 2012-06-25 VITALS — BP 181/93 | HR 75 | Temp 98.5°F | Resp 24 | Ht 63.0 in | Wt 307.0 lb

## 2012-06-25 DIAGNOSIS — N938 Other specified abnormal uterine and vaginal bleeding: Secondary | ICD-10-CM

## 2012-06-25 DIAGNOSIS — N949 Unspecified condition associated with female genital organs and menstrual cycle: Secondary | ICD-10-CM

## 2012-06-25 NOTE — Progress Notes (Signed)
  Subjective:    Patient ID: Susan [name], female    DOB: July 16, 1961, 51 y.o.   MRN: [id number]  HPIPatient's last menstrual period was 05/29/2012. [id number] No bleeding now.    Review of Systems Irregular menses recently   Objective:   Physical Exam  Constitutional: She appears well-developed. No distress.       obes  Skin: No pallor.  Psychiatric: She has a normal mood and affect. Her behavior is normal.    Filed Vitals:   06/25/12 1419  BP: 181/93  Pulse: 75  Temp: 98.5 F (36.9 C)  Resp: 24   *RADIOLOGY REPORT*  Clinical Data: Pelvic pain. Menometrorrhagia. Dysfunctional  uterine bleeding.  TRANSABDOMINAL AND TRANSVAGINAL ULTRASOUND OF PELVIS  Technique: Both transabdominal and transvaginal ultrasound  examinations of the pelvis were performed. Transabdominal  technique was performed for global imaging of the pelvis including  uterus, ovaries, adnexal regions, and pelvic cul-de-sac.  It was necessary to proceed with endovaginal exam following the  transabdominal exam to visualize the endometrial stripe and  ovaries.  Comparison: None  Findings:  Uterus: 11.1 x 6.0 x 7.6 cm. Heterogeneous echogenicity of the  uterine myometrium is noted. A probable intramural fibroid is seen  in the anterior uterine corpus which measures 3.2 cm in maximum  diameter. No other fibroids visualized.  Endometrium: Double layer thickness measures 8 mm transvaginally.  Trilayered appearance. No focal lesion visualized.  Right ovary: 2.4 x 2.0 x 2.5 cm. Normal appearance.  Left ovary: 3.1 x 3.7 x 4.0 cm. A dominant follicle is seen  measuring 2.6 cm. Normal appearance. No adnexal mass identified.  Other Findings: No free fluid  IMPRESSION:  1. Heterogeneous uterine myometrium, with 3.2 cm fibroid in the  anterior corpus.  2. Normal ovaries. No adnexal mass identified.  Original Report Authenticated By: Danae Orleans, M.D.  Benign endocervix on BX      Assessment & Plan:  Suspect  DUB perimenopausal F/U PRN  ARNOLD,JAMES 06/25/2012

## 2012-06-25 NOTE — Patient Instructions (Signed)
Menstruation  Menstruation is the monthly passing of blood, tissue, fluid and mucus, also know as a period. Your body is shedding the lining of the uterus. The flow, or amount of blood, usually lasts from 3 to 7 days each month. Hormones control the menstrual cycle. Hormones are a chemical substance produced by endocrine glands in the body to regulate different bodily functions.  The first menstrual period may start any time between age 50 to 16 years. However, it usually starts around age 11 or 12. Some girls have regular monthly menstrual cycles right from the beginning. However, it is not unusual to have only a couple of drops of blood or spotting when you first start menstruating. It is also not unusual to have two periods a month or miss a month or two when first starting your periods.  SYMPTOMS   · Mild to moderate abdominal cramps.  · Aching or pain in the lower back area.  Symptoms that may occur 5 to 10 days before your menstrual period starts, which is referred to as premenstrual syndrome (PMS). These symptoms can include:  · Headache.  · Breast tenderness and swelling.  · Bloating.  · Tiredness (fatigue).  · Mood changes.  · Craving for certain foods.  These are normal signs and symptoms and can vary in severity. To help relieve these problems, ask your caregiver if you can take over-the-counter medications for pain or discomfort. If the symptoms are not controllable, see your caregiver for help.   HORMONES INVOLVED IN MENSTRUATION  Menstruation comes about because of hormones produced by the pituitary gland in the brain and the ovaries that affect the uterine lining.  First, the pituitary gland in the brain produces the hormone Follicle Stimulating Hormone (FSH). FSH stimulates the ovaries to produce estrogen, which thickens the uterine lining and begins to develop an egg in the ovary. About 14 days later, the pituitary gland produces another hormone called Luteinizing Hormone (LH). LH causes the egg to  come out of a sac in the ovary (ovulation). The empty sac on the ovary called the corpus luteum is stimulated by another hormone from the pituitary gland called luteotropin. The corpus luteum begins to produce the estrogen and progesterone hormone. The progesterone hormone prepares the lining of the uterus to have the fertilized egg (egg and sperm) attach to the lining of the uterus and begin to develop into a fetus. If the egg is not fertilized, the corpus luteum stops producing estrogen and progesterone, it disappears, the lining of the uterus sloughs off and a menstrual period begins. Then the menstrual cycle starts all over again and will continue monthly unless pregnancy occurs or menopause begins.  The secretion of hormones is complex. Various parts of the body become involved in many chemical activities. Female sex hormones have other functions in a woman's body as well. Estrogen increases a woman's sex drive (libido). It naturally helps body get rid of fluids (diuretic). It also aids in the process of building new bone. Therefore, maintaining hormonal health is essential to all levels of a woman's well being. These hormones are usually present in normal amounts and cause you to menstruate. It is the relationship between the (small) levels of the hormones that is critical. When the balance is upset, menstrual irregularities can occur.  HOW DOES THE MENSTRUAL CYCLE HAPPEN?  · Menstrual cycles vary in length from 21 to 35 days with an average of 29 days. The cycle begins on the first day of bleeding. At this   time, the pituitary gland in the brain releases FSH that travels through the bloodstream to the ovaries. The FSH stimulates the follicles in the ovaries. This prepares the body for ovulation that occurs around the 14th day of the cycle. The ovaries produce estrogen, and this makes sure conditions are right in the uterus for implantation of the fertilized egg.  · When the levels of estrogen reach a high  enough level, it signals the gland in the brain (pituitary gland) to release a surge of LH. This causes the release of the ripest egg from its follicle (ovulation). Usually only one follicle releases one egg, but sometimes more than one follicle releases an egg especially when stimulating the ovaries for invitro fertilization. The egg can then be collected by either fallopian tube to await fertilization. The burst follicle within the ovary that is left behind is now called the corpus luteum or "yellow body." The corpus luteum continues to give off (secrete) reduced amounts of estrogen. This closes and hardens the cervix. It driesup the mucus to the naturally infertile condition.  · The corpus luteum also begins to give off greater amounts of progesterone. This causes the lining of the uterus (endometrium) to thicken even more in preparation for the fertilized egg. The egg is starting to journey down from the fallopian tube to the uterus. It also signals the ovaries to stop releasing eggs. It assists in returning the cervical mucus to its infertile state.  · If the egg implants successfully into the womb lining and pregnancy occurs, progesterone levels will continue to raise. It is often this hormone that gives some pregnant women a feeling of well being, like a "natural high." Progesterone levels drop again after childbirth.  · If fertilization does not occur, the corpus luteum dies, stopping the production of hormones. This sudden drop in progesterone causes the uterine lining to break down, accompanied by blood (menstruation).  · This starts the cycle back at day 1. The whole process starts all over again. Woman go through this cycle every month from puberty to menopause. Women have breaks only for pregnancy and breastfeeding (lactation), unless the woman has health problems that affect the female hormone system or chooses to use oral contraceptives to have unnatural menstrual periods.  HOME CARE INSTRUCTIONS    · Keep track of your periods by using a calendar.  · If you use tampons, get the least absorbent to avoid toxic shock syndrome.  · Do not leave tampons in the vagina over night or longer than 6 hours.  · Wear a sanitary pad over night.  · Exercise 3 to 5 times a week or more.  · Avoid foods and drinks that you know will make your symptoms worse before or during your period.  SEEK MEDICAL CARE IF:   · You develop a fever of 100° F (37.8° C) or higher with your period.  · Your periods are lasting more than 7 days.  · Your period is so heavy that you have to change pads or tampons every 30 minutes.  · You develop clots with your period and never had clots before.  · You cannot get relief from over-the-counter medication for your symptoms.  · Your period has not started, and it has been longer than 35 days.  Document Released: 08/10/2002 Document Revised: 11/12/2011 Document Reviewed: 06/04/2008  [employer]® Patient Information ©2013 [employer].

## 2012-07-07 ENCOUNTER — Other Ambulatory Visit: Payer: Self-pay | Admitting: *Deleted

## 2012-07-07 DIAGNOSIS — B20 Human immunodeficiency virus [HIV] disease: Secondary | ICD-10-CM

## 2012-07-07 MED ORDER — EFAVIRENZ-EMTRICITAB-TENOFOVIR 600-200-300 MG PO TABS: 1.0000 | ORAL_TABLET | Freq: Every day | ORAL | Status: DC

## 2012-07-14 ENCOUNTER — Telehealth: Payer: Self-pay | Admitting: *Deleted

## 2012-07-14 ENCOUNTER — Other Ambulatory Visit (INDEPENDENT_AMBULATORY_CARE_PROVIDER_SITE_OTHER): Payer: Medicare Other

## 2012-07-14 DIAGNOSIS — F329 Major depressive disorder, single episode, unspecified: Secondary | ICD-10-CM

## 2012-07-14 DIAGNOSIS — B20 Human immunodeficiency virus [HIV] disease: Secondary | ICD-10-CM

## 2012-07-14 DIAGNOSIS — E785 Hyperlipidemia, unspecified: Secondary | ICD-10-CM

## 2012-07-14 DIAGNOSIS — Z113 Encounter for screening for infections with a predominantly sexual mode of transmission: Secondary | ICD-10-CM

## 2012-07-14 LAB — CBC WITH DIFFERENTIAL/PLATELET
HCT: 41.4 % (ref 36.0–46.0)
Hemoglobin: 14.7 g/dL (ref 12.0–15.0)
Lymphocytes Relative: 37 % (ref 12–46)
Lymphs Abs: 2.1 10*3/uL (ref 0.7–4.0)
Monocytes Absolute: 0.3 10*3/uL (ref 0.1–1.0)
Monocytes Relative: 5 % (ref 3–12)
Neutro Abs: 2.9 10*3/uL (ref 1.7–7.7)
WBC: 5.5 10*3/uL (ref 4.0–10.5)

## 2012-07-14 LAB — COMPREHENSIVE METABOLIC PANEL
BUN: 11 mg/dL (ref 6–23)
CO2: 20 mEq/L (ref 19–32)
Calcium: 9.1 mg/dL (ref 8.4–10.5)
Chloride: 104 mEq/L (ref 96–112)
Creat: 0.74 mg/dL (ref 0.50–1.10)

## 2012-07-14 MED ORDER — ARIPIPRAZOLE 30 MG PO TABS: 30.0000 mg | ORAL_TABLET | Freq: Every day | ORAL | Status: DC

## 2012-07-14 NOTE — Telephone Encounter (Signed)
Received refill request for Abilify.  MD, please advise about refills and how many.

## 2012-07-14 NOTE — Telephone Encounter (Signed)
Ok to give with 5 refills.

## 2012-07-14 NOTE — Addendum Note (Signed)
Addended by: Jennet Maduro D on: 07/14/2012 03:10 PM   Modules accepted: Orders

## 2012-07-15 LAB — HIV-1 RNA QUANT-NO REFLEX-BLD: HIV-1 RNA Quant, Log: <1.3 {Log} (ref <1.30)

## 2012-07-15 LAB — T-HELPER CELL (CD4) - (RCID CLINIC ONLY): CD4 T Cell Abs: 380 uL — ABNORMAL LOW (ref 400–2700)

## 2012-07-21 ENCOUNTER — Telehealth: Payer: Self-pay | Admitting: *Deleted

## 2012-07-21 DIAGNOSIS — B20 Human immunodeficiency virus [HIV] disease: Secondary | ICD-10-CM

## 2012-07-21 NOTE — Telephone Encounter (Signed)
Received a refill request for the patients Celexa. We gave it to her in August with no refills. She does have a PCP. Do you want to auth refills and how many?

## 2012-07-22 ENCOUNTER — Other Ambulatory Visit: Payer: Self-pay | Admitting: *Deleted

## 2012-07-22 DIAGNOSIS — B20 Human immunodeficiency virus [HIV] disease: Secondary | ICD-10-CM

## 2012-07-22 MED ORDER — CITALOPRAM HYDROBROMIDE 20 MG PO TABS: 20.0000 mg | ORAL_TABLET | Freq: Every day | ORAL | Status: DC

## 2012-07-24 NOTE — Telephone Encounter (Signed)
Ok to refill for 6 months 

## 2012-07-28 ENCOUNTER — Encounter: Payer: Self-pay | Admitting: Internal Medicine

## 2012-07-28 ENCOUNTER — Ambulatory Visit (INDEPENDENT_AMBULATORY_CARE_PROVIDER_SITE_OTHER): Payer: Medicare Other | Admitting: Internal Medicine

## 2012-07-28 VITALS — BP 156/90 | HR 81 | Temp 97.7°F | Ht 63.0 in | Wt 305.0 lb

## 2012-07-28 DIAGNOSIS — Z21 Asymptomatic human immunodeficiency virus [HIV] infection status: Secondary | ICD-10-CM

## 2012-07-28 DIAGNOSIS — F259 Schizoaffective disorder, unspecified: Secondary | ICD-10-CM

## 2012-07-28 DIAGNOSIS — I10 Essential (primary) hypertension: Secondary | ICD-10-CM

## 2012-07-28 DIAGNOSIS — F329 Major depressive disorder, single episode, unspecified: Secondary | ICD-10-CM

## 2012-07-28 DIAGNOSIS — B20 Human immunodeficiency virus [HIV] disease: Secondary | ICD-10-CM

## 2012-07-28 MED ORDER — ARIPIPRAZOLE 30 MG PO TABS: 30.0000 mg | ORAL_TABLET | Freq: Every day | ORAL | Status: DC

## 2012-07-28 MED ORDER — QUETIAPINE FUMARATE 100 MG PO TABS: 100.0000 mg | ORAL_TABLET | Freq: Every day | ORAL | Status: DC

## 2012-07-28 NOTE — Assessment & Plan Note (Signed)
She is hearing voices but denies any suicidal ideation. I will prescribe for her Seroquel dose and Abilify pending evaluation by psychiatry.  I did tell her I would give her enough for 3 months which will give her plenty of time to be established by psychiatry. She knows to call she has any problems.

## 2012-07-28 NOTE — Assessment & Plan Note (Signed)
She did quit smoking over one month ago.

## 2012-07-28 NOTE — Assessment & Plan Note (Signed)
She has lost some weight and I did congratulate her. I encouraged continued to improve diet and walking

## 2012-07-28 NOTE — Progress Notes (Signed)
  Subjective:    Patient ID: Susan [name], female    DOB: Oct 26, 1960, 51 y.o.   MRN: [id number]  HPI She comes in for followup of her HIV. She continues on Atripla and has remained asymptomatic from an HIV standpoint. She has remained undetectable. Fortunately, her CD4 count has now risen above 300. She also is followed closely for her primary care needs including diabetes and blood pressure by her primary care physician. She is scheduled to see a psychiatrist in January of 2014. She did have a recent emergency room visit in which she was hearing voices. She has been out of her Abilify and Seroquel.   Review of Systems  Constitutional: Negative for fever, chills, fatigue and unexpected weight change.  HENT: Negative for sore throat and trouble swallowing.   Respiratory: Negative for cough and shortness of breath.   Cardiovascular: Negative for chest pain, palpitations and leg swelling.  Gastrointestinal: Negative for nausea, abdominal pain and diarrhea.  Musculoskeletal: Negative for myalgias, joint swelling and arthralgias.  Neurological: Negative for dizziness and headaches.  Psychiatric/Behavioral: Negative for dysphoric mood. The patient is not nervous/anxious.        Objective:   Physical Exam  Constitutional: She appears well-developed. No distress.  Cardiovascular: Normal rate, regular rhythm and normal heart sounds.  Exam reveals no gallop and no friction rub.   No murmur heard. Pulmonary/Chest: Effort normal and breath sounds normal. No respiratory distress. She has no wheezes. She has no rales.  Lymphadenopathy:    She has no cervical adenopathy.  Psychiatric: She has a normal mood and affect. Her behavior is normal.          Assessment & Plan:

## 2012-07-28 NOTE — Assessment & Plan Note (Signed)
She is doing well and has now had much better immune reconstitution with an increase of her CD4 count. She will continue with the same and followup in 6 months. She knows to call sooner if she has any problems prior to that.

## 2012-08-15 ENCOUNTER — Encounter (HOSPITAL_COMMUNITY): Payer: Self-pay | Admitting: Emergency Medicine

## 2012-08-15 ENCOUNTER — Emergency Department (HOSPITAL_COMMUNITY)
Admission: EM | Admit: 2012-08-15 | Discharge: 2012-08-15 | Disposition: A | Discharge status: Home / Self Care | Payer: Medicare Other | Attending: Emergency Medicine | Admitting: Emergency Medicine

## 2012-08-15 DIAGNOSIS — R6883 Chills (without fever): Secondary | ICD-10-CM | POA: Insufficient documentation

## 2012-08-15 DIAGNOSIS — B3731 Acute candidiasis of vulva and vagina: Secondary | ICD-10-CM | POA: Insufficient documentation

## 2012-08-15 DIAGNOSIS — Z8739 Personal history of other diseases of the musculoskeletal system and connective tissue: Secondary | ICD-10-CM | POA: Insufficient documentation

## 2012-08-15 DIAGNOSIS — R61 Generalized hyperhidrosis: Secondary | ICD-10-CM | POA: Insufficient documentation

## 2012-08-15 DIAGNOSIS — I10 Essential (primary) hypertension: Secondary | ICD-10-CM | POA: Insufficient documentation

## 2012-08-15 DIAGNOSIS — Z8679 Personal history of other diseases of the circulatory system: Secondary | ICD-10-CM | POA: Insufficient documentation

## 2012-08-15 DIAGNOSIS — B373 Candidiasis of vulva and vagina: Secondary | ICD-10-CM

## 2012-08-15 DIAGNOSIS — Z8673 Personal history of transient ischemic attack (TIA), and cerebral infarction without residual deficits: Secondary | ICD-10-CM | POA: Insufficient documentation

## 2012-08-15 DIAGNOSIS — J45909 Unspecified asthma, uncomplicated: Secondary | ICD-10-CM | POA: Insufficient documentation

## 2012-08-15 DIAGNOSIS — Z79899 Other long term (current) drug therapy: Secondary | ICD-10-CM | POA: Insufficient documentation

## 2012-08-15 DIAGNOSIS — E119 Type 2 diabetes mellitus without complications: Secondary | ICD-10-CM | POA: Insufficient documentation

## 2012-08-15 DIAGNOSIS — R110 Nausea: Secondary | ICD-10-CM | POA: Insufficient documentation

## 2012-08-15 DIAGNOSIS — R109 Unspecified abdominal pain: Secondary | ICD-10-CM | POA: Insufficient documentation

## 2012-08-15 DIAGNOSIS — Z87828 Personal history of other (healed) physical injury and trauma: Secondary | ICD-10-CM | POA: Insufficient documentation

## 2012-08-15 DIAGNOSIS — K219 Gastro-esophageal reflux disease without esophagitis: Secondary | ICD-10-CM | POA: Insufficient documentation

## 2012-08-15 DIAGNOSIS — Z87891 Personal history of nicotine dependence: Secondary | ICD-10-CM | POA: Insufficient documentation

## 2012-08-15 DIAGNOSIS — Z21 Asymptomatic human immunodeficiency virus [HIV] infection status: Secondary | ICD-10-CM | POA: Insufficient documentation

## 2012-08-15 DIAGNOSIS — Z794 Long term (current) use of insulin: Secondary | ICD-10-CM | POA: Insufficient documentation

## 2012-08-15 DIAGNOSIS — E7800 Pure hypercholesterolemia, unspecified: Secondary | ICD-10-CM | POA: Insufficient documentation

## 2012-08-15 DIAGNOSIS — Z7982 Long term (current) use of aspirin: Secondary | ICD-10-CM | POA: Insufficient documentation

## 2012-08-15 DIAGNOSIS — R300 Dysuria: Secondary | ICD-10-CM | POA: Insufficient documentation

## 2012-08-15 LAB — URINALYSIS, ROUTINE W REFLEX MICROSCOPIC
Protein, ur: NEGATIVE mg/dL
Urobilinogen, UA: 0.2 mg/dL (ref 0.0–1.0)

## 2012-08-15 LAB — WET PREP, GENITAL: Clue Cells Wet Prep HPF POC: NONE SEEN

## 2012-08-15 LAB — URINE MICROSCOPIC-ADD ON

## 2012-08-15 MED ORDER — NYSTATIN 100000 UNIT/GM EX CREA: TOPICAL_CREAM | CUTANEOUS | Status: DC

## 2012-08-15 MED ORDER — OXYCODONE-ACETAMINOPHEN 5-325 MG PO TABS
2.0000 | ORAL_TABLET | Freq: Once | ORAL | Status: AC
  Administered 2012-08-15: 2 via ORAL
  Filled 2012-08-15: qty 2

## 2012-08-15 MED ORDER — FLUCONAZOLE 100 MG PO TABS
200.0000 mg | ORAL_TABLET | Freq: Once | ORAL | Status: AC
  Administered 2012-08-15: 200 mg via ORAL
  Filled 2012-08-15: qty 2

## 2012-08-15 MED ORDER — HYDROCODONE-ACETAMINOPHEN 5-325 MG PO TABS: 2.0000 | ORAL_TABLET | ORAL | Status: DC | PRN
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
## 2012-08-15 NOTE — ED Notes (Signed)
Pt c/o white vaginal discharge that thinks is yeast infection; pt sts hx of similar in past

## 2012-09-04 ENCOUNTER — Other Ambulatory Visit: Payer: Self-pay | Admitting: Internal Medicine

## 2012-09-18 ENCOUNTER — Other Ambulatory Visit: Payer: Self-pay | Admitting: Internal Medicine

## 2012-09-29 ENCOUNTER — Emergency Department (HOSPITAL_COMMUNITY)
Admission: EM | Admit: 2012-09-29 | Discharge: 2012-09-29 | Disposition: A | Discharge status: Home / Self Care | Payer: Medicare Other | Attending: Emergency Medicine | Admitting: Emergency Medicine

## 2012-09-29 ENCOUNTER — Emergency Department (HOSPITAL_COMMUNITY): Payer: Medicare Other

## 2012-09-29 ENCOUNTER — Encounter (HOSPITAL_COMMUNITY): Payer: Self-pay | Admitting: *Deleted

## 2012-09-29 DIAGNOSIS — Z87828 Personal history of other (healed) physical injury and trauma: Secondary | ICD-10-CM | POA: Insufficient documentation

## 2012-09-29 DIAGNOSIS — R52 Pain, unspecified: Secondary | ICD-10-CM | POA: Insufficient documentation

## 2012-09-29 DIAGNOSIS — Z3202 Encounter for pregnancy test, result negative: Secondary | ICD-10-CM | POA: Insufficient documentation

## 2012-09-29 DIAGNOSIS — I10 Essential (primary) hypertension: Secondary | ICD-10-CM | POA: Insufficient documentation

## 2012-09-29 DIAGNOSIS — J45909 Unspecified asthma, uncomplicated: Secondary | ICD-10-CM | POA: Insufficient documentation

## 2012-09-29 DIAGNOSIS — Z21 Asymptomatic human immunodeficiency virus [HIV] infection status: Secondary | ICD-10-CM | POA: Insufficient documentation

## 2012-09-29 DIAGNOSIS — R197 Diarrhea, unspecified: Secondary | ICD-10-CM | POA: Insufficient documentation

## 2012-09-29 DIAGNOSIS — E7800 Pure hypercholesterolemia, unspecified: Secondary | ICD-10-CM | POA: Insufficient documentation

## 2012-09-29 DIAGNOSIS — IMO0002 Reserved for concepts with insufficient information to code with codable children: Secondary | ICD-10-CM | POA: Insufficient documentation

## 2012-09-29 DIAGNOSIS — Z7982 Long term (current) use of aspirin: Secondary | ICD-10-CM | POA: Insufficient documentation

## 2012-09-29 DIAGNOSIS — R112 Nausea with vomiting, unspecified: Secondary | ICD-10-CM | POA: Insufficient documentation

## 2012-09-29 DIAGNOSIS — R739 Hyperglycemia, unspecified: Secondary | ICD-10-CM

## 2012-09-29 DIAGNOSIS — Z794 Long term (current) use of insulin: Secondary | ICD-10-CM | POA: Insufficient documentation

## 2012-09-29 DIAGNOSIS — M129 Arthropathy, unspecified: Secondary | ICD-10-CM | POA: Insufficient documentation

## 2012-09-29 DIAGNOSIS — B9789 Other viral agents as the cause of diseases classified elsewhere: Secondary | ICD-10-CM | POA: Insufficient documentation

## 2012-09-29 DIAGNOSIS — K219 Gastro-esophageal reflux disease without esophagitis: Secondary | ICD-10-CM | POA: Insufficient documentation

## 2012-09-29 DIAGNOSIS — Z87891 Personal history of nicotine dependence: Secondary | ICD-10-CM | POA: Insufficient documentation

## 2012-09-29 DIAGNOSIS — Z8679 Personal history of other diseases of the circulatory system: Secondary | ICD-10-CM | POA: Insufficient documentation

## 2012-09-29 DIAGNOSIS — E1169 Type 2 diabetes mellitus with other specified complication: Secondary | ICD-10-CM | POA: Insufficient documentation

## 2012-09-29 DIAGNOSIS — Z79899 Other long term (current) drug therapy: Secondary | ICD-10-CM | POA: Insufficient documentation

## 2012-09-29 DIAGNOSIS — Z8673 Personal history of transient ischemic attack (TIA), and cerebral infarction without residual deficits: Secondary | ICD-10-CM | POA: Insufficient documentation

## 2012-09-29 DIAGNOSIS — B349 Viral infection, unspecified: Secondary | ICD-10-CM

## 2012-09-29 DIAGNOSIS — G40909 Epilepsy, unspecified, not intractable, without status epilepticus: Secondary | ICD-10-CM | POA: Insufficient documentation

## 2012-09-29 DIAGNOSIS — Z9089 Acquired absence of other organs: Secondary | ICD-10-CM | POA: Insufficient documentation

## 2012-09-29 LAB — CBC WITH DIFFERENTIAL/PLATELET
Basophils Relative: 1 % (ref 0–1)
Eosinophils Absolute: 0.3 10*3/uL (ref 0.0–0.7)
Hemoglobin: 15.9 g/dL — ABNORMAL HIGH (ref 12.0–15.0)
MCH: 31.4 pg (ref 26.0–34.0)
MCHC: 35.9 g/dL (ref 30.0–36.0)
Monocytes Absolute: 0.4 10*3/uL (ref 0.1–1.0)
Monocytes Relative: 6 % (ref 3–12)
Neutrophils Relative %: 52 % (ref 43–77)

## 2012-09-29 LAB — COMPREHENSIVE METABOLIC PANEL
Albumin: 3.7 g/dL (ref 3.5–5.2)
BUN: 5 mg/dL — ABNORMAL LOW (ref 6–23)
Calcium: 9 mg/dL (ref 8.4–10.5)
Creatinine, Ser: 0.65 mg/dL (ref 0.50–1.10)
Potassium: 3.5 mEq/L (ref 3.5–5.1)
Total Protein: 7.3 g/dL (ref 6.0–8.3)

## 2012-09-29 LAB — URINE MICROSCOPIC-ADD ON

## 2012-09-29 LAB — URINALYSIS, ROUTINE W REFLEX MICROSCOPIC
Leukocytes, UA: NEGATIVE
Nitrite: NEGATIVE
Specific Gravity, Urine: 1.039 — ABNORMAL HIGH (ref 1.005–1.030)
pH: 5.5 (ref 5.0–8.0)

## 2012-09-29 LAB — GLUCOSE, CAPILLARY: Glucose-Capillary: 381 mg/dL — ABNORMAL HIGH (ref 70–99)

## 2012-09-29 LAB — LIPASE, BLOOD: Lipase: 30 U/L (ref 11–59)

## 2012-09-29 IMAGING — CR DG CHEST 2V
2 series · 2 of 2 positions shown · non-contrast
Comparison: None.

CLINICAL DATA: Weakness with cough and congestion.  History of
diabetes and hypertension.

CHEST - 2 VIEW

[w chest pa]
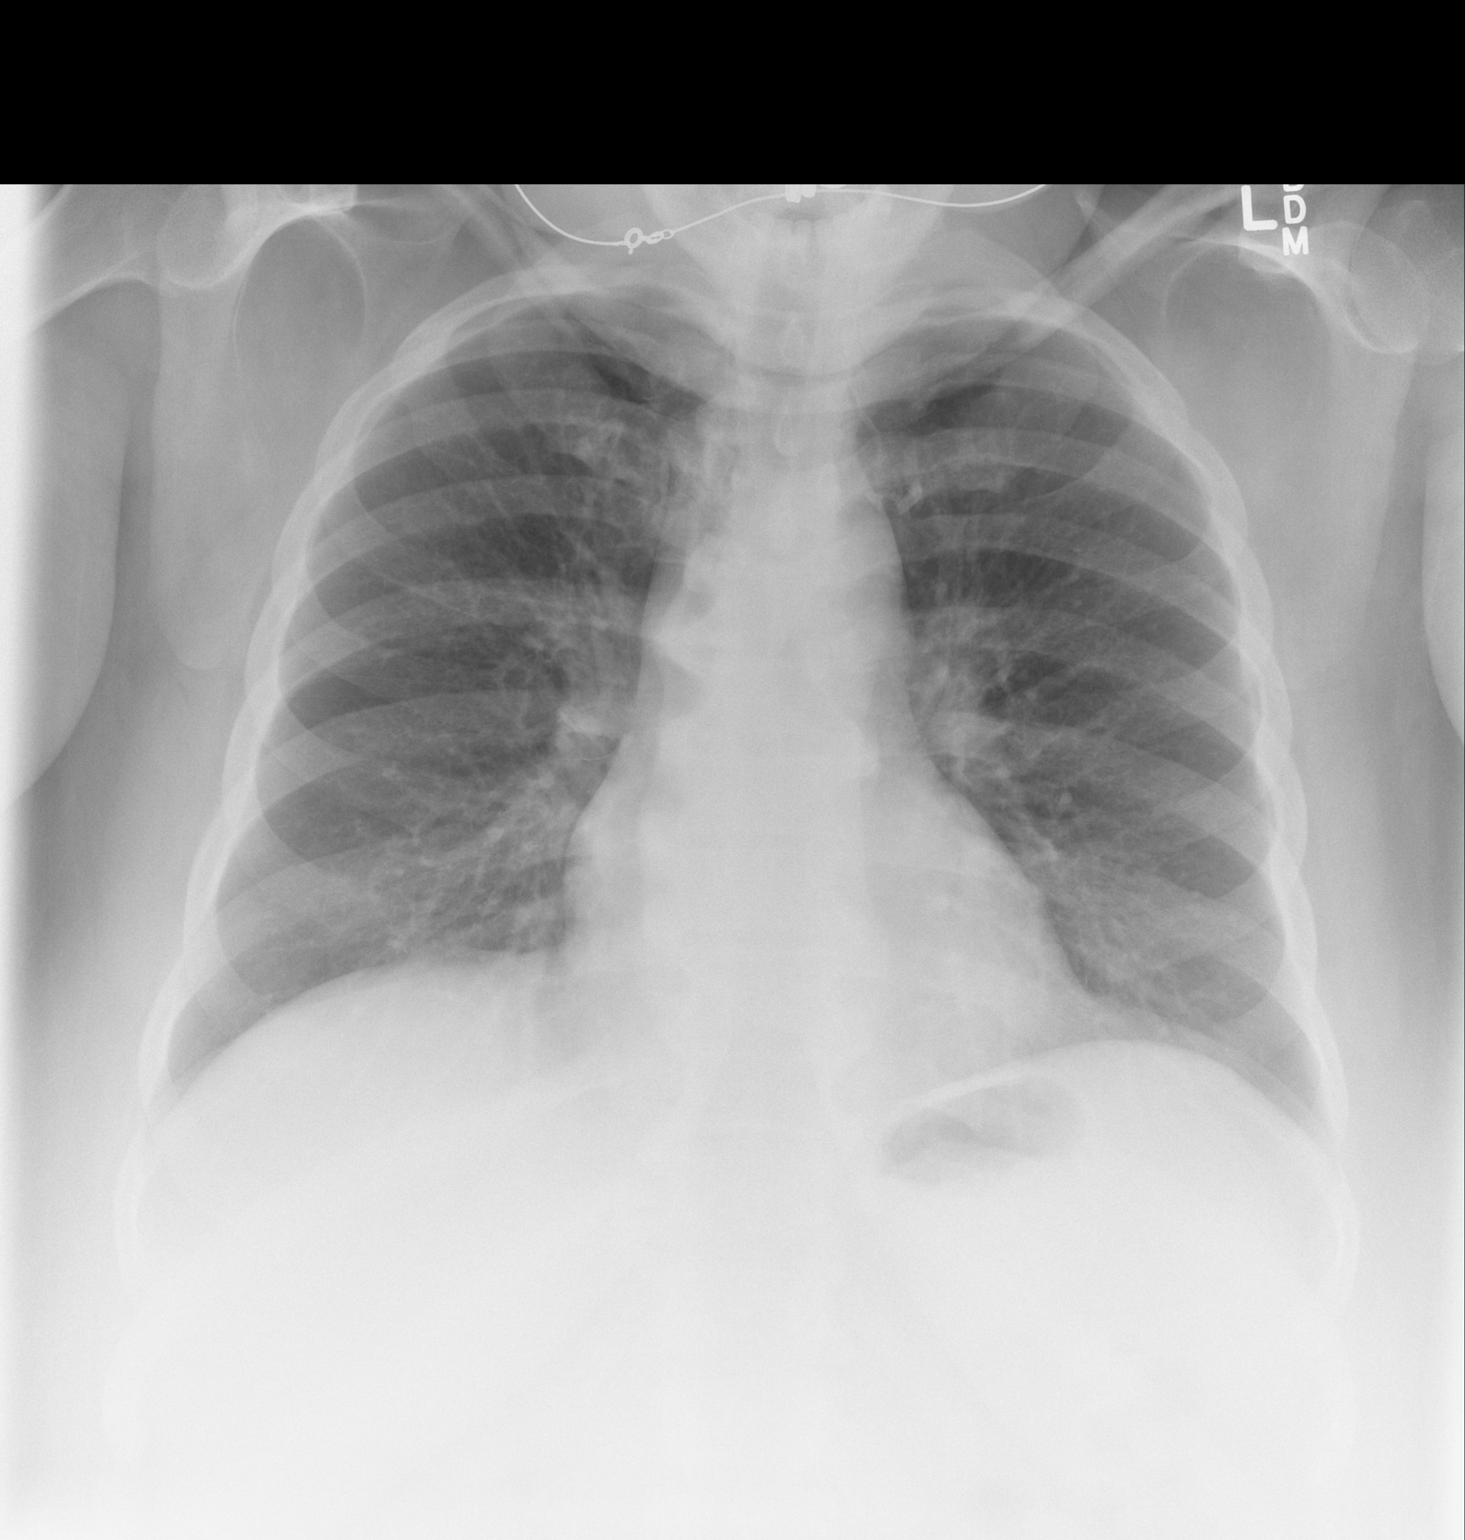

[w chest lat *]
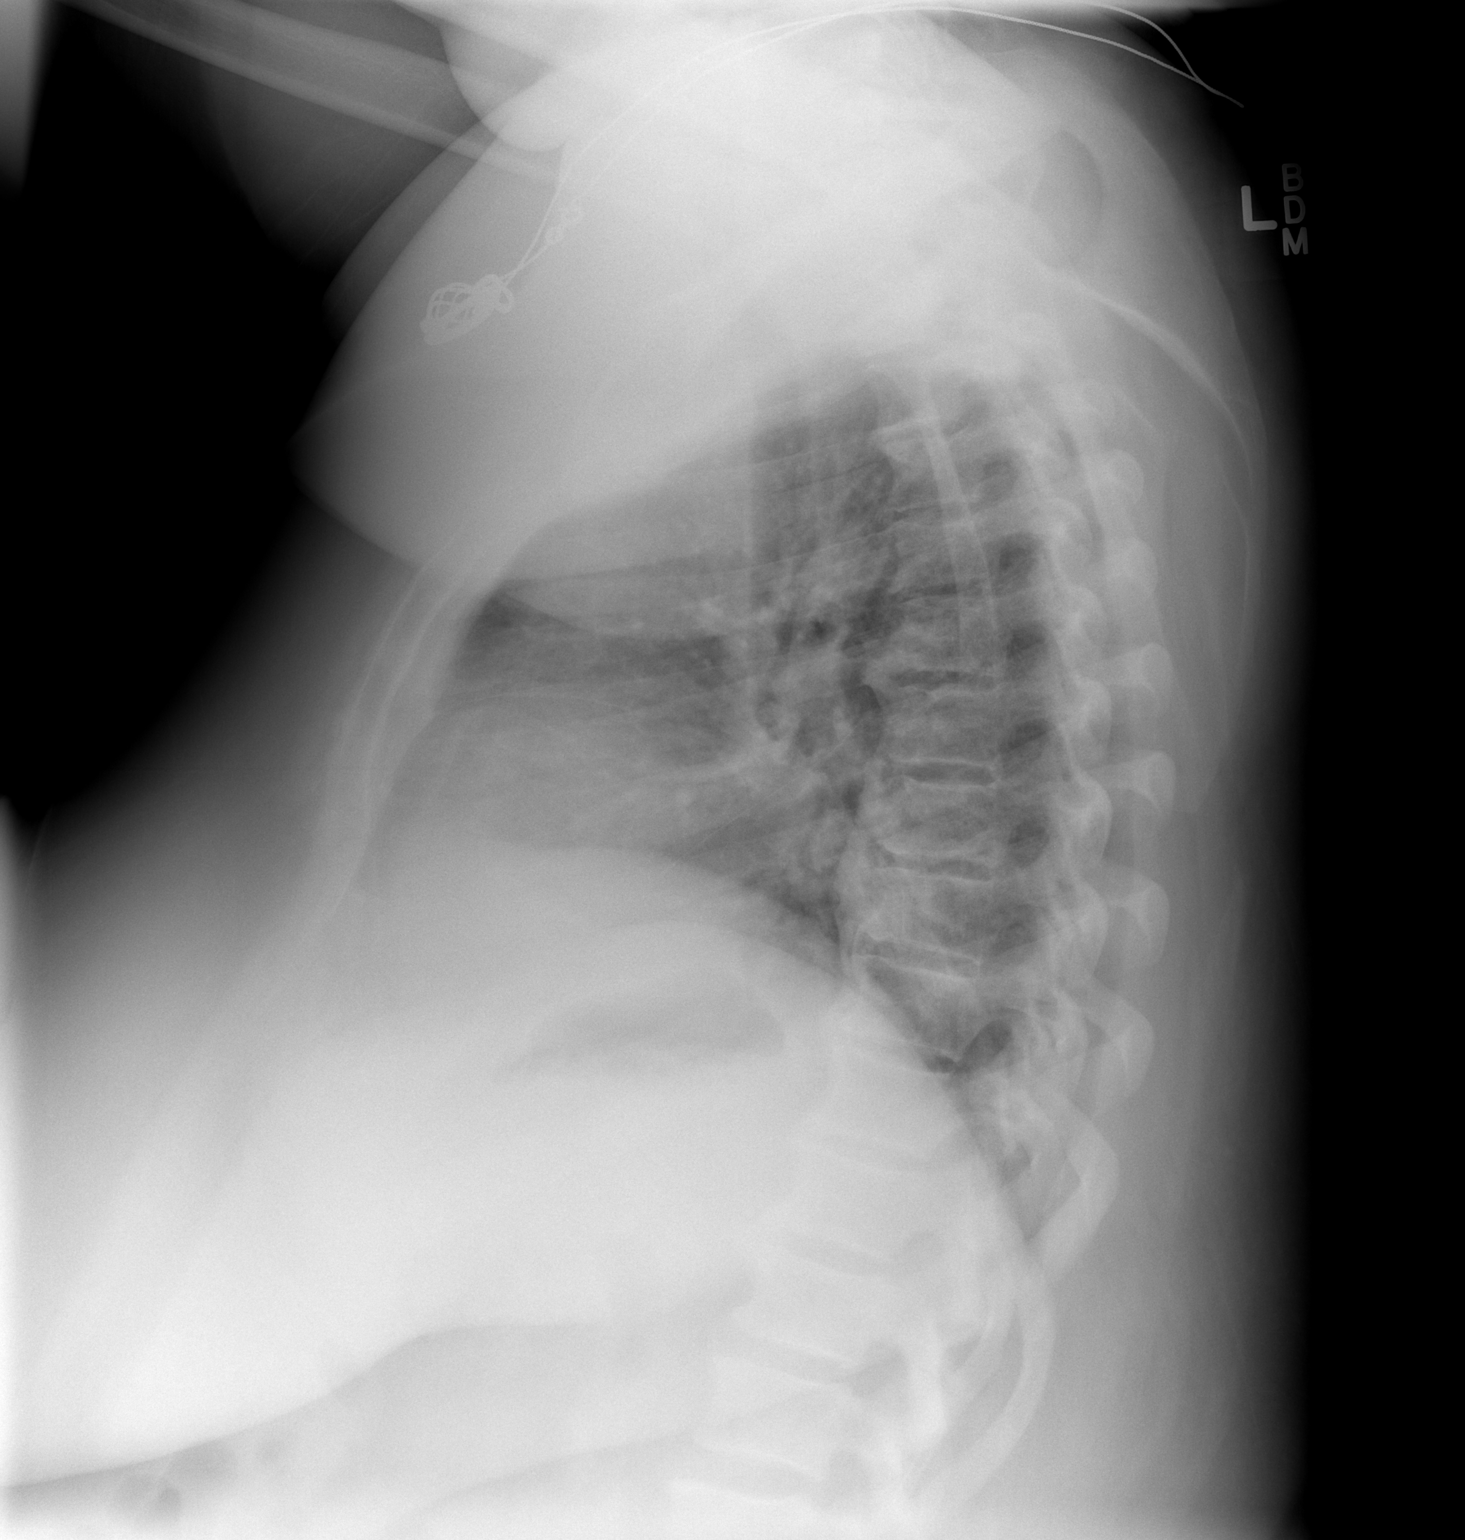

[2 of 2 positions shown; findings below may reference images not displayed]

FINDINGS: The heart size and mediastinal contours are normal.
There is patchy bibasilar atelectasis without consolidation, edema
or significant pleural effusion.  Mild central airway thickening is
suggested.  Paraspinal osteophytes are noted throughout the
thoracic spine.
IMPRESSION: Central airway thickening with bibasilar atelectasis.  No
consolidation or edema identified.

## 2012-09-29 MED ORDER — SODIUM CHLORIDE 0.9 % IV SOLN
Freq: Once | INTRAVENOUS | Status: AC
  Administered 2012-09-29: 13:00:00 via INTRAVENOUS

## 2012-09-29 MED ORDER — SODIUM CHLORIDE 0.9 % IV SOLN
Freq: Once | INTRAVENOUS | Status: AC
  Administered 2012-09-29: 14:00:00 via INTRAVENOUS

## 2012-09-29 MED ORDER — INSULIN ASPART 100 UNIT/ML ~~LOC~~ SOLN
10.0000 [IU] | Freq: Once | SUBCUTANEOUS | Status: AC
  Administered 2012-09-29: 10 [IU] via INTRAVENOUS
  Filled 2012-09-29: qty 1

## 2012-09-29 MED ORDER — HYDROMORPHONE HCL PF 1 MG/ML IJ SOLN
1.0000 mg | Freq: Once | INTRAMUSCULAR | Status: AC
  Administered 2012-09-29: 1 mg via INTRAVENOUS
  Filled 2012-09-29: qty 1

## 2012-09-29 MED ORDER — ONDANSETRON HCL 4 MG PO TABS: 4.0000 mg | ORAL_TABLET | Freq: Four times a day (QID) | ORAL | Status: DC

## 2012-09-29 MED ORDER — HYDROCODONE-ACETAMINOPHEN 5-325 MG PO TABS: 2.0000 | ORAL_TABLET | ORAL | Status: DC | PRN

## 2012-09-29 NOTE — ED Notes (Signed)
No active vomiting or diarrhea noted while in ER.

## 2012-09-29 NOTE — ED Notes (Signed)
Pt's CBG is 295. RN notified.

## 2012-09-29 NOTE — ED Notes (Signed)
PA made aware of BP. Reporting discharge patient with instructions to continue to take bp meds and follow up with PCP tomorrow

## 2012-09-29 NOTE — ED Notes (Signed)
Called back for triage, no answer

## 2012-09-29 NOTE — ED Provider Notes (Signed)
Medical screening examination/treatment/procedure(s) were performed by non-physician practitioner and as supervising physician I was immediately available for consultation/collaboration.   Suzi Roots, MD 09/29/12 [phone]

## 2012-09-29 NOTE — ED Notes (Signed)
Pt c/o n/v/d x 3 days. States sugar has been running high. States body aches all over,

## 2012-09-29 NOTE — ED Notes (Signed)
Pt is here with vomiting and diarrhea for three days and sugar has been running high.  Pt reports body aches.  Blood sugar at triage 381

## 2012-09-29 NOTE — ED Notes (Signed)
Pt reports cough with shortness of breath and states had some blood in her vomit.  No chest pain
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
## 2012-09-30 LAB — URINE CULTURE

## 2012-10-01 ENCOUNTER — Ambulatory Visit: Payer: Medicare Other | Admitting: Internal Medicine

## 2012-10-02 ENCOUNTER — Ambulatory Visit (INDEPENDENT_AMBULATORY_CARE_PROVIDER_SITE_OTHER): Payer: Medicare Other | Admitting: Endocrinology

## 2012-10-02 ENCOUNTER — Encounter: Payer: Self-pay | Admitting: Endocrinology

## 2012-10-02 VITALS — BP 140/72 | HR 82 | Wt 294.0 lb

## 2012-10-02 DIAGNOSIS — E1149 Type 2 diabetes mellitus with other diabetic neurological complication: Secondary | ICD-10-CM
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
## 2012-10-02 NOTE — Patient Instructions (Addendum)
Please increase the insulin to 40 units daily. Please come back for a follow-up appointment in 1 month. check your blood sugar twice a day.  vary the time of day when you check, between before the 3 meals, and at bedtime.  also check if you have symptoms of your blood sugar being too high or too low.  please keep a record of the readings and bring it to your next appointment here.  please call us sooner if your blood sugar goes below 70, or if you have a lot of readings over 200.

## 2012-10-10 ENCOUNTER — Ambulatory Visit (INDEPENDENT_AMBULATORY_CARE_PROVIDER_SITE_OTHER): Payer: Medicare Other | Admitting: Internal Medicine

## 2012-10-10 ENCOUNTER — Encounter: Payer: Self-pay | Admitting: Internal Medicine

## 2012-10-10 ENCOUNTER — Other Ambulatory Visit (INDEPENDENT_AMBULATORY_CARE_PROVIDER_SITE_OTHER): Payer: Medicare Other

## 2012-10-10 VITALS — BP 144/90 | HR 86 | Temp 98.7°F | Wt 287.8 lb

## 2012-10-10 DIAGNOSIS — I10 Essential (primary) hypertension: Secondary | ICD-10-CM

## 2012-10-10 DIAGNOSIS — E1149 Type 2 diabetes mellitus with other diabetic neurological complication: Secondary | ICD-10-CM

## 2012-10-10 DIAGNOSIS — E785 Hyperlipidemia, unspecified: Secondary | ICD-10-CM

## 2012-10-10 DIAGNOSIS — Z1239 Encounter for other screening for malignant neoplasm of breast: Secondary | ICD-10-CM

## 2012-10-10 DIAGNOSIS — Z0000 Encounter for general adult medical examination without abnormal findings: Secondary | ICD-10-CM

## 2012-10-10 DIAGNOSIS — Z1211 Encounter for screening for malignant neoplasm of colon: Secondary | ICD-10-CM

## 2012-10-10 LAB — HEMOGLOBIN A1C: Hgb A1c MFr Bld: 10.7 % — ABNORMAL HIGH (ref 4.6–6.5)

## 2012-10-10 LAB — LDL CHOLESTEROL, DIRECT: Direct LDL: 138.5 mg/dL

## 2012-10-10 LAB — LIPID PANEL
HDL: 43.4 mg/dL (ref >39.00)
Total CHOL/HDL Ratio: 5

## 2012-10-10 MED ORDER — SIMVASTATIN 40 MG PO TABS: 40.0000 mg | ORAL_TABLET | Freq: Every evening | ORAL | Status: DC

## 2012-10-10 MED ORDER — OMEPRAZOLE 20 MG PO CPDR: 20.0000 mg | DELAYED_RELEASE_CAPSULE | Freq: Every day | ORAL | Status: DC

## 2012-10-10 MED ORDER — CLONIDINE HCL 0.2 MG PO TABS: 0.2000 mg | ORAL_TABLET | Freq: Two times a day (BID) | ORAL | Status: DC

## 2012-10-10 MED ORDER — ATENOLOL 25 MG PO TABS: 25.0000 mg | ORAL_TABLET | Freq: Every day | ORAL | Status: DC

## 2012-10-10 MED ORDER — FLUTICASONE-SALMETEROL 230-21 MCG/ACT IN AERO: 2.0000 | INHALATION_SPRAY | Freq: Two times a day (BID) | RESPIRATORY_TRACT | Status: DC

## 2012-10-10 MED ORDER — ENALAPRIL MALEATE 20 MG PO TABS: 20.0000 mg | ORAL_TABLET | Freq: Every day | ORAL | Status: DC

## 2012-10-10 MED ORDER — AMLODIPINE BESYLATE 5 MG PO TABS: 5.0000 mg | ORAL_TABLET | Freq: Every day | ORAL | Status: DC

## 2012-10-10 NOTE — Assessment & Plan Note (Signed)
Dx 10/2010 in [place] during hospitalization for HONK - on insulin +met until 01/2011 -then metformin only Now working with endo: amaryl, januvia + metformin changed to Lantus 07/2012 Encouraged efforts at weight loss as ongoing with diet and exercise On ACE, added statin and ASA 04/2011, esp with TIA hx 2010 Lab Results  Component Value Date   HGBA1C 9.5* 05/20/2012
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
## 2012-10-10 NOTE — Patient Instructions (Signed)
It was good to see you today. We have reviewed your prior records including labs and tests today Health Maintenance reviewed - all recommended immunizations and age-appropriate screenings are up-to-date. Increase clonidine to 2x/day for blood pressure control Your prescription(s) and refills have been submitted to your pharmacy. Please take as directed and contact our office if you believe you are having problem(s) with the medication(s). we'll make referral to GI for colonoscopy screening and for ,ammogram. Our office will contact you regarding appointment(s) once made. Test(s) ordered today. Your results will be released to MyChart (or called to you) after review, usually within 72hours after test completion. If any changes need to be made, you will be notified at that same time. Continue working with your other specialists as ongoing  Continue to work on lifestyle changes as discussed (low fat, low carb, increased protein diet; improved exercise efforts; weight loss) to control sugar, blood pressure and cholesterol levels and/or reduce risk of developing other medical problems. Look into [email or web address] or other type of food journal to assist you in this process. Please schedule followup in 6 months for blood pressure and weight check, call sooner if problems.  Health Maintenance, Females A healthy lifestyle and preventative care can promote health and wellness.  Maintain regular health, dental, and eye exams.   Eat a healthy diet. Foods like vegetables, fruits, whole grains, low-fat dairy products, and lean protein foods contain the nutrients you need without too many calories. Decrease your intake of foods high in solid fats, added sugars, and salt. Get information about a proper diet from your caregiver, if necessary.   Regular physical exercise is one of the most important things you can do for your health. Most adults should get at least 150 minutes of moderate-intensity exercise (any  activity that increases your heart rate and causes you to sweat) each week. In addition, most adults need muscle-strengthening exercises on 2 or more days a week.     Maintain a healthy weight. The body mass index (BMI) is a screening tool to identify possible weight problems. It provides an estimate of body fat based on height and weight. Your caregiver can help determine your BMI, and can help you achieve or maintain a healthy weight. For adults 20 years and older:   A BMI below 18.5 is considered underweight.   A BMI of 18.5 to 24.9 is normal.   A BMI of 25 to 29.9 is considered overweight.   A BMI of 30 and above is considered obese.   Maintain normal blood lipids and cholesterol by exercising and minimizing your intake of saturated fat. Eat a balanced diet with plenty of fruits and vegetables. Blood tests for lipids and cholesterol should begin at age 56 and be repeated every 5 years. If your lipid or cholesterol levels are high, you are over 50, or you are a high risk for heart disease, you may need your cholesterol levels checked more frequently. Ongoing high lipid and cholesterol levels should be treated with medicines if diet and exercise are not effective.   If you smoke, find out from your caregiver how to quit. If you do not use tobacco, do not start.   If you are pregnant, do not drink alcohol. If you are breastfeeding, be very cautious about drinking alcohol. If you are not pregnant and choose to drink alcohol, do not exceed 1 drink per day. One drink is considered to be 12 ounces (355 mL) of beer, 5 ounces (148 mL) of wine, or  1.5 ounces (44 mL) of liquor.   Avoid use of street drugs. Do not share needles with anyone. Ask for help if you need support or instructions about stopping the use of drugs.   High blood pressure causes heart disease and increases the risk of stroke. Blood pressure should be checked at least every 1 to 2 years. Ongoing high blood pressure should be treated  with medicines, if weight loss and exercise are not effective.   If you are 72 to 51 years old, ask your caregiver if you should take aspirin to prevent strokes.   Diabetes screening involves taking a blood sample to check your fasting blood sugar level. This should be done once every 3 years, after age 54, if you are within normal weight and without risk factors for diabetes. Testing should be considered at a younger age or be carried out more frequently if you are overweight and have at least 1 risk factor for diabetes.   Breast cancer screening is essential preventative care for women. You should practice "breast self-awareness." This means understanding the normal appearance and feel of your breasts and may include breast self-examination. Any changes detected, no matter how small, should be reported to a caregiver. Women in their 68s and 30s should have a clinical breast exam (CBE) by a caregiver as part of a regular health exam every 1 to 3 years. After age 21, women should have a CBE every year. Starting at age 59, women should consider having a mammogram (breast X-ray) every year. Women who have a family history of breast cancer should talk to their caregiver about genetic screening. Women at a high risk of breast cancer should talk to their caregiver about having an MRI and a mammogram every year.   The Pap test is a screening test for cervical cancer. Women should have a Pap test starting at age 29. Between ages 31 and 41, Pap tests should be repeated every 2 years. Beginning at age 23, you should have a Pap test every 3 years as long as the past 3 Pap tests have been normal. If you had a hysterectomy for a problem that was not cancer or a condition that could lead to cancer, then you no longer need Pap tests. If you are between ages 43 and 1, and you have had normal Pap tests going back 10 years, you no longer need Pap tests. If you have had past treatment for cervical cancer or a condition that  could lead to cancer, you need Pap tests and screening for cancer for at least 20 years after your treatment. If Pap tests have been discontinued, risk factors (such as a new sexual partner) need to be reassessed to determine if screening should be resumed. Some women have medical problems that increase the chance of getting cervical cancer. In these cases, your caregiver may recommend more frequent screening and Pap tests.   The human papillomavirus (HPV) test is an additional test that may be used for cervical cancer screening. The HPV test looks for the virus that can cause the cell changes on the cervix. The cells collected during the Pap test can be tested for HPV. The HPV test could be used to screen women aged 48 years and older, and should be used in women of any age who have unclear Pap test results. After the age of 23, women should have HPV testing at the same frequency as a Pap test.   Colorectal cancer can be detected and often  prevented. Most routine colorectal cancer screening begins at the age of 73 and continues through age 49. However, your caregiver may recommend screening at an earlier age if you have risk factors for colon cancer. On a yearly basis, your caregiver may provide home test kits to check for hidden blood in the stool. Use of a small camera at the end of a tube, to directly examine the colon (sigmoidoscopy or colonoscopy), can detect the earliest forms of colorectal cancer. Talk to your caregiver about this at age 87, when routine screening begins. Direct examination of the colon should be repeated every 5 to 10 years through age 85, unless early forms of pre-cancerous polyps or small growths are found.   Hepatitis C blood testing is recommended for all people born from [birth year] through 1964 and any individual with known risks for hepatitis C.   Practice safe sex. Use condoms and avoid high-risk sexual practices to reduce the spread of sexually transmitted infections (STIs).  Sexually active women aged 90 and younger should be checked for Chlamydia, which is a common sexually transmitted infection. Older women with new or multiple partners should also be tested for Chlamydia. Testing for other STIs is recommended if you are sexually active and at increased risk.   Osteoporosis is a disease in which the bones lose minerals and strength with aging. This can result in serious bone fractures. The risk of osteoporosis can be identified using a bone density scan. Women ages 15 and over and women at risk for fractures or osteoporosis should discuss screening with their caregivers. Ask your caregiver whether you should be taking a calcium supplement or vitamin D to reduce the rate of osteoporosis.   Menopause can be associated with physical symptoms and risks. Hormone replacement therapy is available to decrease symptoms and risks. You should talk to your caregiver about whether hormone replacement therapy is right for you.   Use sunscreen with a sun protection factor (SPF) of 30 or greater. Apply sunscreen liberally and repeatedly throughout the day. You should seek shade when your shadow is shorter than you. Protect yourself by wearing long sleeves, pants, a wide-brimmed hat, and sunglasses year round, whenever you are outdoors.   Notify your caregiver of new moles or changes in moles, especially if there is a change in shape or color. Also notify your caregiver if a mole is larger than the size of a pencil eraser.   Stay current with your immunizations.  Document Released: 03/05/2011 Document Revised: 11/12/2011 Document Reviewed: 03/05/2011 [facility] Patient Information 2013 [place], [place].   Health Maintenance, Females A healthy lifestyle and preventative care can promote health and wellness.  Maintain regular health, dental, and eye exams.   Eat a healthy diet. Foods like vegetables, fruits, whole grains, low-fat dairy products, and lean protein foods contain the  nutrients you need without too many calories. Decrease your intake of foods high in solid fats, added sugars, and salt. Get information about a proper diet from your caregiver, if necessary.   Regular physical exercise is one of the most important things you can do for your health. Most adults should get at least 150 minutes of moderate-intensity exercise (any activity that increases your heart rate and causes you to sweat) each week. In addition, most adults need muscle-strengthening exercises on 2 or more days a week.     Maintain a healthy weight. The body mass index (BMI) is a screening tool to identify possible weight problems. It provides an estimate of body fat  based on height and weight. Your caregiver can help determine your BMI, and can help you achieve or maintain a healthy weight. For adults 20 years and older:   A BMI below 18.5 is considered underweight.   A BMI of 18.5 to 24.9 is normal.   A BMI of 25 to 29.9 is considered overweight.   A BMI of 30 and above is considered obese.   Maintain normal blood lipids and cholesterol by exercising and minimizing your intake of saturated fat. Eat a balanced diet with plenty of fruits and vegetables. Blood tests for lipids and cholesterol should begin at age 54 and be repeated every 5 years. If your lipid or cholesterol levels are high, you are over 50, or you are a high risk for heart disease, you may need your cholesterol levels checked more frequently. Ongoing high lipid and cholesterol levels should be treated with medicines if diet and exercise are not effective.   If you smoke, find out from your caregiver how to quit. If you do not use tobacco, do not start.   If you are pregnant, do not drink alcohol. If you are breastfeeding, be very cautious about drinking alcohol. If you are not pregnant and choose to drink alcohol, do not exceed 1 drink per day. One drink is considered to be 12 ounces (355 mL) of beer, 5 ounces (148 mL) of wine, or  1.5 ounces (44 mL) of liquor.   Avoid use of street drugs. Do not share needles with anyone. Ask for help if you need support or instructions about stopping the use of drugs.   High blood pressure causes heart disease and increases the risk of stroke. Blood pressure should be checked at least every 1 to 2 years. Ongoing high blood pressure should be treated with medicines, if weight loss and exercise are not effective.   If you are 44 to 51 years old, ask your caregiver if you should take aspirin to prevent strokes.   Diabetes screening involves taking a blood sample to check your fasting blood sugar level. This should be done once every 3 years, after age 91, if you are within normal weight and without risk factors for diabetes. Testing should be considered at a younger age or be carried out more frequently if you are overweight and have at least 1 risk factor for diabetes.   Breast cancer screening is essential preventative care for women. You should practice "breast self-awareness." This means understanding the normal appearance and feel of your breasts and may include breast self-examination. Any changes detected, no matter how small, should be reported to a caregiver. Women in their 61s and 30s should have a clinical breast exam (CBE) by a caregiver as part of a regular health exam every 1 to 3 years. After age 41, women should have a CBE every year. Starting at age 75, women should consider having a mammogram (breast X-ray) every year. Women who have a family history of breast cancer should talk to their caregiver about genetic screening. Women at a high risk of breast cancer should talk to their caregiver about having an MRI and a mammogram every year.   The Pap test is a screening test for cervical cancer. Women should have a Pap test starting at age 9. Between ages 63 and 31, Pap tests should be repeated every 2 years. Beginning at age 59, you should have a Pap test every 3 years as long as the  past 3 Pap tests have been normal.  If you had a hysterectomy for a problem that was not cancer or a condition that could lead to cancer, then you no longer need Pap tests. If you are between ages 73 and 68, and you have had normal Pap tests going back 10 years, you no longer need Pap tests. If you have had past treatment for cervical cancer or a condition that could lead to cancer, you need Pap tests and screening for cancer for at least 20 years after your treatment. If Pap tests have been discontinued, risk factors (such as a new sexual partner) need to be reassessed to determine if screening should be resumed. Some women have medical problems that increase the chance of getting cervical cancer. In these cases, your caregiver may recommend more frequent screening and Pap tests.   The human papillomavirus (HPV) test is an additional test that may be used for cervical cancer screening. The HPV test looks for the virus that can cause the cell changes on the cervix. The cells collected during the Pap test can be tested for HPV. The HPV test could be used to screen women aged 64 years and older, and should be used in women of any age who have unclear Pap test results. After the age of 63, women should have HPV testing at the same frequency as a Pap test.   Colorectal cancer can be detected and often prevented. Most routine colorectal cancer screening begins at the age of 47 and continues through age 94. However, your caregiver may recommend screening at an earlier age if you have risk factors for colon cancer. On a yearly basis, your caregiver may provide home test kits to check for hidden blood in the stool. Use of a small camera at the end of a tube, to directly examine the colon (sigmoidoscopy or colonoscopy), can detect the earliest forms of colorectal cancer. Talk to your caregiver about this at age 72, when routine screening begins. Direct examination of the colon should be repeated every 5 to 10 years  through age 62, unless early forms of pre-cancerous polyps or small growths are found.   Hepatitis C blood testing is recommended for all people born from [birth year] through 1964 and any individual with known risks for hepatitis C.   Practice safe sex. Use condoms and avoid high-risk sexual practices to reduce the spread of sexually transmitted infections (STIs). Sexually active women aged 17 and younger should be checked for Chlamydia, which is a common sexually transmitted infection. Older women with new or multiple partners should also be tested for Chlamydia. Testing for other STIs is recommended if you are sexually active and at increased risk.   Osteoporosis is a disease in which the bones lose minerals and strength with aging. This can result in serious bone fractures. The risk of osteoporosis can be identified using a bone density scan. Women ages 70 and over and women at risk for fractures or osteoporosis should discuss screening with their caregivers. Ask your caregiver whether you should be taking a calcium supplement or vitamin D to reduce the rate of osteoporosis.   Menopause can be associated with physical symptoms and risks. Hormone replacement therapy is available to decrease symptoms and risks. You should talk to your caregiver about whether hormone replacement therapy is right for you.   Use sunscreen with a sun protection factor (SPF) of 30 or greater. Apply sunscreen liberally and repeatedly throughout the day. You should seek shade when your shadow is shorter than you. Protect yourself by  wearing long sleeves, pants, a wide-brimmed hat, and sunglasses year round, whenever you are outdoors.   Notify your caregiver of new moles or changes in moles, especially if there is a change in shape or color. Also notify your caregiver if a mole is larger than the size of a pencil eraser.   Stay current with your immunizations.  Document Released: 03/05/2011 Document Revised: 11/12/2011 Document  Reviewed: 03/05/2011 [facility] Patient Information 2013 [place], [place].

## 2012-10-10 NOTE — Addendum Note (Signed)
Addended by: Rene Paci A on: 10/10/2012 05:04 PM   Modules accepted: Orders

## 2012-10-10 NOTE — Assessment & Plan Note (Signed)
subop control - addition of clonidine by ID 03/2011, will increase dose now (pt taking only qd, instructed on BID) increased ACEI to max dose 04/2011-  Avoid increase beta-blocker dose due to borderline bradycardia and asthma increased dose amlodipine 01/2012 Continue follow up and titration, esp with TIA hx and other co morbities May also improve with diet/exercise and weight loss BP Readings from Last 3 Encounters:  10/10/12 144/90  10/02/12 140/72  09/29/12 197/79

## 2012-10-10 NOTE — Assessment & Plan Note (Signed)
On simva Check lipids annually and titrate as needed

## 2012-10-20 ENCOUNTER — Other Ambulatory Visit: Payer: Self-pay | Admitting: *Deleted

## 2012-10-20 ENCOUNTER — Telehealth: Payer: Self-pay | Admitting: *Deleted

## 2012-10-20 DIAGNOSIS — F259 Schizoaffective disorder, unspecified: Secondary | ICD-10-CM

## 2012-10-20 MED ORDER — CITALOPRAM HYDROBROMIDE 20 MG PO TABS: 20.0000 mg | ORAL_TABLET | Freq: Every day | ORAL | Status: DC

## 2012-10-20 NOTE — Telephone Encounter (Signed)
Prescription refill request for Citalopram 20mg  1 tab QDay. Per your last office note, she was supposed to follow up with psychiatry in January, 2014.  Please advise. Andree Coss, RN

## 2012-10-20 NOTE — Telephone Encounter (Signed)
That is ok to refill.  I won't refill the Abilify or Seroquel though.  Thanks

## 2012-10-21 ENCOUNTER — Encounter: Payer: Self-pay | Admitting: Internal Medicine

## 2012-10-29 ENCOUNTER — Ambulatory Visit (HOSPITAL_COMMUNITY): Payer: Medicare Other

## 2012-11-04 ENCOUNTER — Ambulatory Visit (HOSPITAL_COMMUNITY): Payer: Medicare Other

## 2012-11-10 ENCOUNTER — Ambulatory Visit (HOSPITAL_COMMUNITY)
Admission: RE | Admit: 2012-11-10 | Discharge: 2012-11-10 | Disposition: A | Discharge status: Home / Self Care | Payer: Medicare Other | Source: Ambulatory Visit | Attending: Internal Medicine | Admitting: Internal Medicine

## 2012-11-10 DIAGNOSIS — Z1231 Encounter for screening mammogram for malignant neoplasm of breast: Secondary | ICD-10-CM | POA: Insufficient documentation

## 2012-11-10 IMAGING — MG MM DIGITAL SCREENING BILAT W/ CAD
7 series · 7 of 7 positions shown · non-contrast
Comparison: None.

CLINICAL DATA: Screening.

DIGITAL BILATERAL SCREENING MAMMOGRAM WITH CAD

[R CC (1 of 2)]
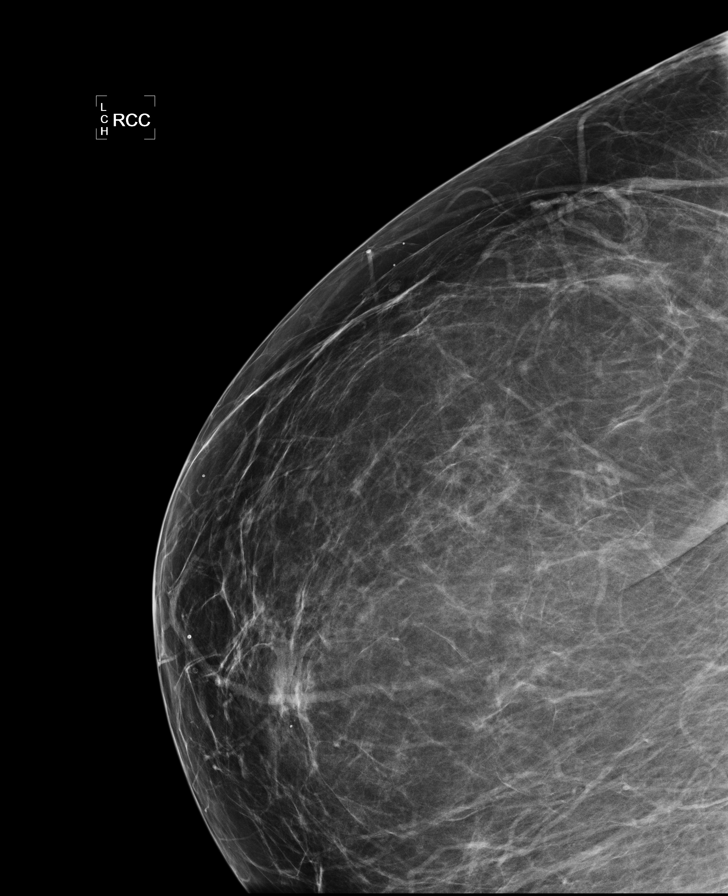

[R MLO (1 of 2)]
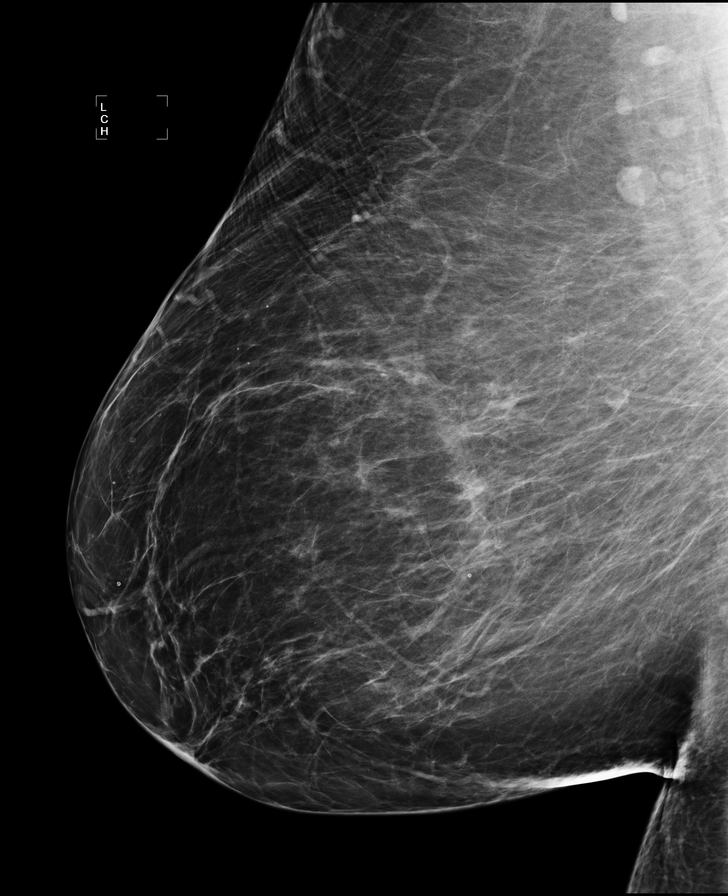

[L CC (1 of 2)]
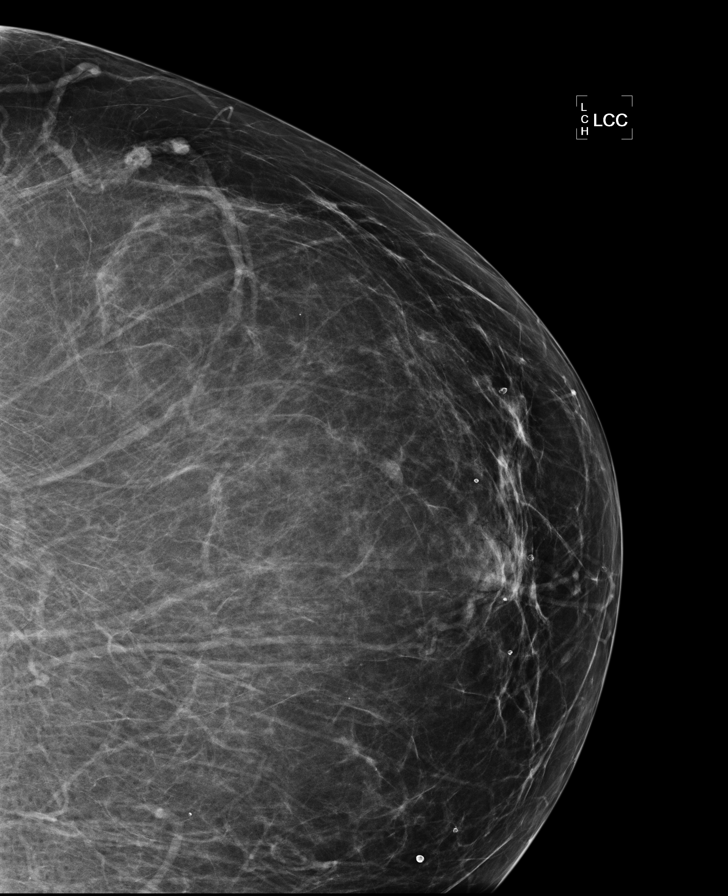

[L MLO]
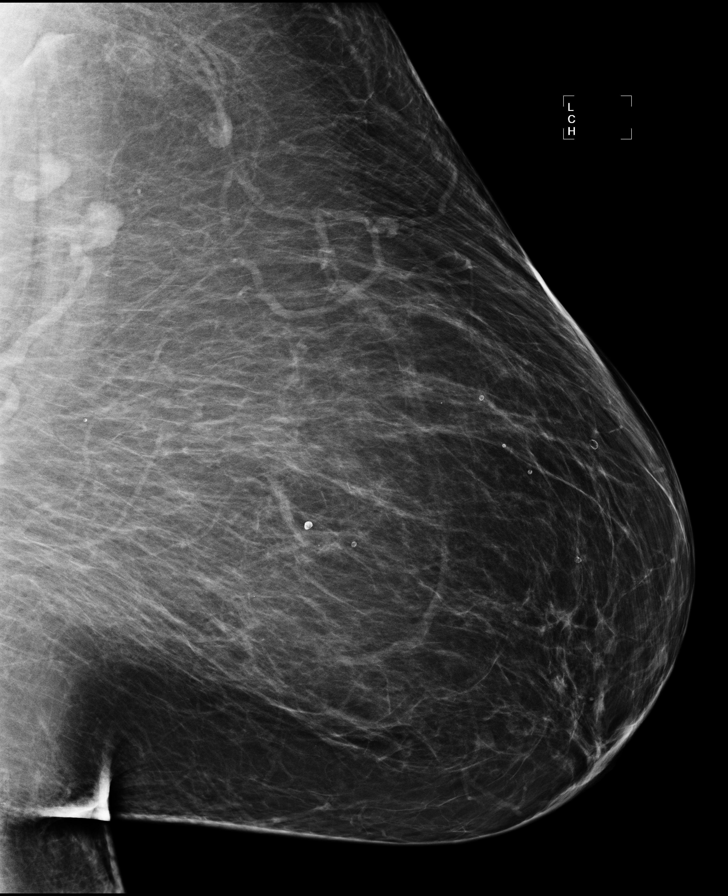

[R CC (2 of 2)]
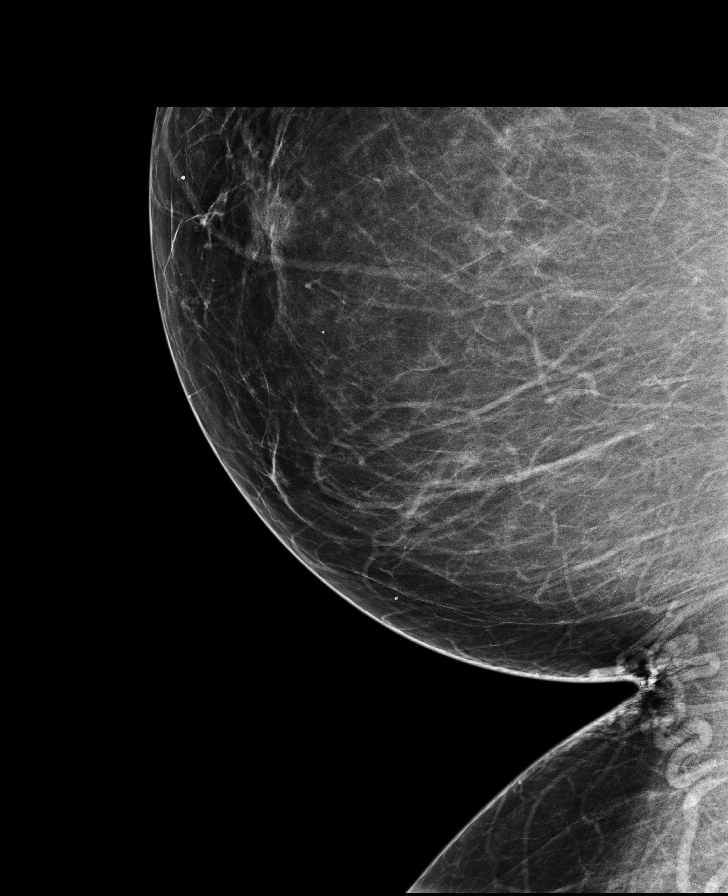

[R MLO (2 of 2)]
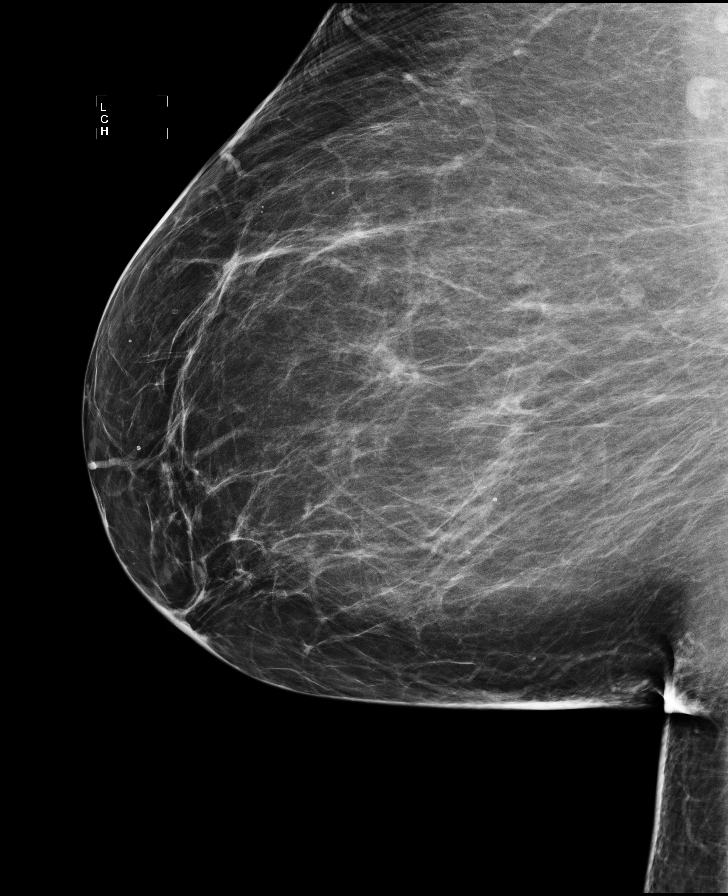

[L CC (2 of 2)]
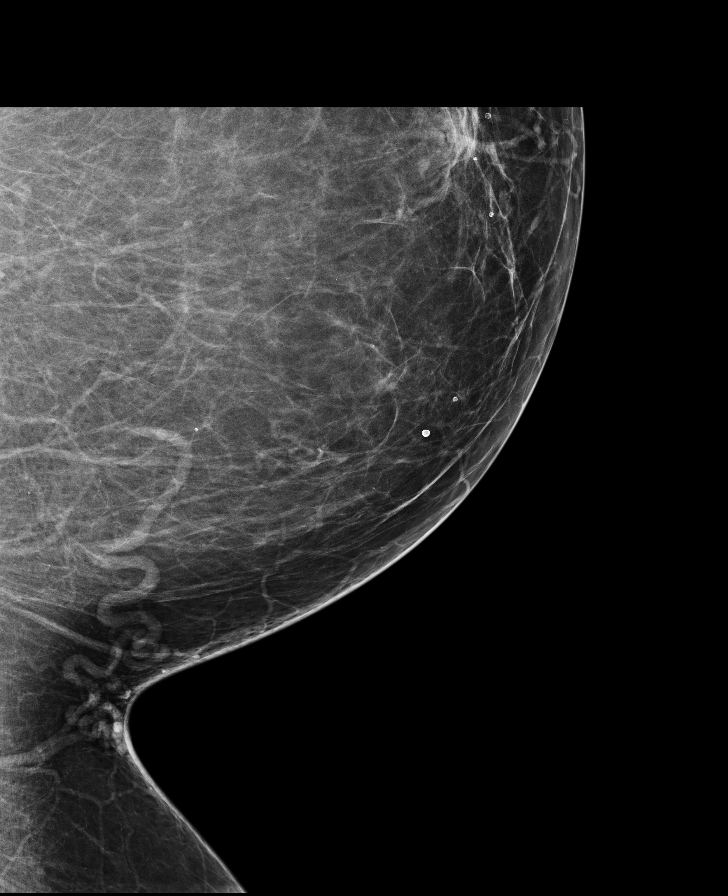

[7 of 7 positions shown; findings below may reference images not displayed]

FINDINGS: ACR Breast Density Category 2: There is a scattered fibroglandular
pattern.

No suspicious masses, architectural distortion, or calcifications
are present.

Images were processed with CAD.
IMPRESSION: No mammographic evidence of malignancy.

A result letter of this screening mammogram will be mailed directly
to the patient.

RECOMMENDATION:
Screening mammogram in one year. (Code:[XD])

BI-RADS CATEGORY 1:  Negative.

## 2012-11-11 ENCOUNTER — Ambulatory Visit: Payer: Medicare Other | Admitting: Internal Medicine

## 2012-11-17 ENCOUNTER — Other Ambulatory Visit: Payer: Self-pay

## 2012-11-19 ENCOUNTER — Telehealth: Payer: Self-pay | Admitting: *Deleted

## 2012-11-19 NOTE — Telephone Encounter (Signed)
Denied refill request for Seroquel.  Dr. Ephriam Knuckles OV note from 07/28/12 stated that the patient needed to establish psychiatric care within 3 months.  He would only provide a 44-month refill for Seroquel.  Faxed back request to [facility] with this information.  Contacted pt to let her know that Dr. Luciana Axe only authorized 3 months of refills for this medication and that she needed to obtain appointment with a psychiatrist.  Pt verbalized understanding and stated that she was working with her social worker to obtain an appointment.

## 2012-11-25 ENCOUNTER — Telehealth: Payer: Self-pay | Admitting: Endocrinology

## 2012-11-25 NOTE — Telephone Encounter (Signed)
The pt called and is hoping to get [place] test strips called into the pharmacy

## 2012-11-26 ENCOUNTER — Other Ambulatory Visit: Payer: Self-pay

## 2012-11-26 MED ORDER — GLUCOSE BLOOD VI STRP: ORAL_STRIP | Status: DC

## 2012-11-26 NOTE — Telephone Encounter (Signed)
Spoke with pt she would like contour test strips, not [place]

## 2012-12-01 ENCOUNTER — Ambulatory Visit (AMBULATORY_SURGERY_CENTER): Payer: Medicare Other

## 2012-12-01 VITALS — Ht 63.0 in | Wt 285.8 lb

## 2012-12-01 DIAGNOSIS — Z1211 Encounter for screening for malignant neoplasm of colon: Secondary | ICD-10-CM

## 2012-12-01 NOTE — Progress Notes (Signed)
Pt came into the office today for her pre-visit prior to her colonoscopy with Dr Rhea Belton on 12/08/12. Due to BMI restrictions, pt was informed she would need to have her colonoscopy done at the hospital.A message was sent to [facility] (RN)-Dr Pyrtle's nurse. Pt was informed a nurse would call her with another appointment for her pre-visit and an appointment at the hospital for her colonoscopy. The appt for 12/08/12 was cancelled. Pt understood.

## 2012-12-03 ENCOUNTER — Telehealth: Payer: Self-pay | Admitting: Gastroenterology

## 2012-12-03 ENCOUNTER — Other Ambulatory Visit: Payer: Self-pay | Admitting: Gastroenterology

## 2012-12-03 DIAGNOSIS — Z1211 Encounter for screening for malignant neoplasm of colon: Secondary | ICD-10-CM

## 2012-12-03 MED ORDER — PEG-KCL-NACL-NASULF-NA ASC-C 100 G PO SOLR: 1.0000 | Freq: Once | ORAL | Status: DC

## 2012-12-03 NOTE — Telephone Encounter (Signed)
Spoke to pt. Went over instructions for her colonoscopy, she did say janice went over them when she came in for pre-visit. I told her i will mail her out a new copy of instructions with her date and times, and to call me with any questions. I also reminded her to pick up her prep at her pharmacy.  Pt stated understanding.

## 2012-12-08 ENCOUNTER — Encounter: Payer: Medicare Other | Admitting: Internal Medicine

## 2012-12-16 ENCOUNTER — Encounter (HOSPITAL_COMMUNITY): Payer: Self-pay | Admitting: *Deleted

## 2012-12-29 ENCOUNTER — Encounter (HOSPITAL_COMMUNITY): Payer: Self-pay | Admitting: Pharmacy Technician

## 2013-01-01 ENCOUNTER — Telehealth: Payer: Self-pay | Admitting: Gastroenterology

## 2013-01-01 NOTE — Telephone Encounter (Signed)
Called pt to see if she would like to move her procedure up from 10:30 01/02/2013 @ [facility] to 8:30am due to an opening that just came available. Pt declined because she already had her transportation set up for a certain time.

## 2013-01-02 ENCOUNTER — Encounter (HOSPITAL_COMMUNITY): Payer: Self-pay | Admitting: Anesthesiology

## 2013-01-02 ENCOUNTER — Ambulatory Visit (HOSPITAL_COMMUNITY): Payer: Medicare Other | Admitting: Anesthesiology

## 2013-01-02 ENCOUNTER — Encounter (HOSPITAL_COMMUNITY): Payer: Self-pay | Admitting: *Deleted

## 2013-01-02 ENCOUNTER — Encounter (HOSPITAL_COMMUNITY)
Admission: RE | Disposition: A | Discharge status: Home / Self Care | Payer: Self-pay | Source: Ambulatory Visit | Attending: Internal Medicine

## 2013-01-02 ENCOUNTER — Ambulatory Visit (HOSPITAL_COMMUNITY)
Admission: RE | Admit: 2013-01-02 | Discharge: 2013-01-02 | Disposition: A | Discharge status: Home / Self Care | Payer: Medicare Other | Source: Ambulatory Visit | Attending: Internal Medicine | Admitting: Internal Medicine

## 2013-01-02 DIAGNOSIS — K219 Gastro-esophageal reflux disease without esophagitis: Secondary | ICD-10-CM | POA: Insufficient documentation

## 2013-01-02 DIAGNOSIS — D129 Benign neoplasm of anus and anal canal: Secondary | ICD-10-CM | POA: Insufficient documentation

## 2013-01-02 DIAGNOSIS — D128 Benign neoplasm of rectum: Secondary | ICD-10-CM | POA: Insufficient documentation

## 2013-01-02 DIAGNOSIS — Z8673 Personal history of transient ischemic attack (TIA), and cerebral infarction without residual deficits: Secondary | ICD-10-CM | POA: Insufficient documentation

## 2013-01-02 DIAGNOSIS — Z79899 Other long term (current) drug therapy: Secondary | ICD-10-CM | POA: Insufficient documentation

## 2013-01-02 DIAGNOSIS — I10 Essential (primary) hypertension: Secondary | ICD-10-CM | POA: Insufficient documentation

## 2013-01-02 DIAGNOSIS — Z21 Asymptomatic human immunodeficiency virus [HIV] infection status: Secondary | ICD-10-CM | POA: Insufficient documentation

## 2013-01-02 DIAGNOSIS — Z1211 Encounter for screening for malignant neoplasm of colon: Secondary | ICD-10-CM

## 2013-01-02 DIAGNOSIS — K635 Polyp of colon: Secondary | ICD-10-CM

## 2013-01-02 DIAGNOSIS — E119 Type 2 diabetes mellitus without complications: Secondary | ICD-10-CM | POA: Insufficient documentation

## 2013-01-02 DIAGNOSIS — D126 Benign neoplasm of colon, unspecified: Secondary | ICD-10-CM

## 2013-01-02 HISTORY — PX: COLONOSCOPY WITH PROPOFOL: SHX5780

## 2013-01-02 HISTORY — DX: Depression, unspecified: F32.A

## 2013-01-02 HISTORY — DX: Major depressive disorder, single episode, unspecified: F32.9

## 2013-01-02 SURGERY — COLONOSCOPY WITH PROPOFOL
Anesthesia: Monitor Anesthesia Care

## 2013-01-02 MED ORDER — PROPOFOL 10 MG/ML IV EMUL
INTRAVENOUS | Status: DC | PRN
  Administered 2013-01-02: 140 ug/kg/min via INTRAVENOUS

## 2013-01-02 MED ORDER — MIDAZOLAM HCL 5 MG/5ML IJ SOLN
INTRAMUSCULAR | Status: DC | PRN
  Administered 2013-01-02 (×2): 1 mg via INTRAVENOUS

## 2013-01-02 MED ORDER — SODIUM CHLORIDE 0.9 % IV SOLN: INTRAVENOUS | Status: DC

## 2013-01-02 MED ORDER — LACTATED RINGERS IV SOLN
INTRAVENOUS | Status: DC | PRN
  Administered 2013-01-02: 12:00:00 via INTRAVENOUS

## 2013-01-02 MED ORDER — KETAMINE HCL 10 MG/ML IJ SOLN
INTRAMUSCULAR | Status: DC | PRN
  Administered 2013-01-02 (×6): 10 mg via INTRAVENOUS

## 2013-01-02 SURGICAL SUPPLY — 21 items

## 2013-01-02 NOTE — Anesthesia Postprocedure Evaluation (Signed)
Anesthesia Post Note  Patient: Susan [name]  Procedure(s) Performed: Procedure(s) (LRB): COLONOSCOPY WITH PROPOFOL (N/A)  Anesthesia type: General  Patient location: PACU  Post pain: Pain level controlled  Post assessment: Post-op Vital signs reviewed  Last Vitals:  Filed Vitals:   01/02/13 1315  BP: 224/114  Temp:   Resp: 24    Post vital signs: Reviewed  Level of consciousness: sedated  Complications: No apparent anesthesia complications
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
## 2013-01-02 NOTE — Anesthesia Preprocedure Evaluation (Signed)
Anesthesia Evaluation  Patient identified by MRN, date of birth, ID band Patient awake    Reviewed: Allergy & Precautions, H&P , NPO status , Patient's Chart, lab work & pertinent test results  Airway Mallampati: II      Dental  (+) Edentulous Upper and Dental Advisory Given   Pulmonary neg pulmonary ROS, shortness of breath, asthma ,  breath sounds clear to auscultation  Pulmonary exam normal       Cardiovascular hypertension, negative cardio ROS  Rhythm:Regular Rate:Normal - Systolic Click    Neuro/Psych  Headaches, Seizures -,  Depression TIACVA negative neurological ROS  negative psych ROS   GI/Hepatic negative GI ROS, GERD-  ,(+)     substance abuse  IV drug use,   Endo/Other  negative endocrine ROSdiabetes, Type 2, Insulin Dependent  Renal/GU negative Renal ROS  negative genitourinary   Musculoskeletal negative musculoskeletal ROS (+)   Abdominal   Peds negative pediatric ROS (+)  Hematology negative hematology ROS (+) HIV,   Anesthesia Other Findings   Reproductive/Obstetrics negative OB ROS                           Anesthesia Physical Anesthesia Plan  ASA: III  Anesthesia Plan:    Post-op Pain Management:    Induction:   Airway Management Planned: Simple Face Mask  Additional Equipment:   Intra-op Plan:   Post-operative Plan: Extubation in OR  Informed Consent: I have reviewed the patients History and Physical, chart, labs and discussed the procedure including the risks, benefits and alternatives for the proposed anesthesia with the patient or authorized representative who has indicated his/her understanding and acceptance.   Dental advisory given  Plan Discussed with: CRNA  Anesthesia Plan Comments:         Anesthesia Quick Evaluation

## 2013-01-02 NOTE — Transfer of Care (Signed)
Immediate Anesthesia Transfer of Care Note  Patient: Susan [name]  Procedure(s) Performed: Procedure(s): COLONOSCOPY WITH PROPOFOL (N/A)  Patient Location: PACU and Endoscopy Unit  Anesthesia Type:MAC  Level of Consciousness: awake, alert , oriented and patient cooperative  Airway & Oxygen Therapy: Patient Spontanous Breathing and Patient connected to face mask oxygen  Post-op Assessment: Report given to PACU RN, Post -op Vital signs reviewed and stable and Patient moving all extremities X 4  Post vital signs: Reviewed and stable  Complications: No apparent anesthesia complications

## 2013-01-02 NOTE — Op Note (Addendum)
[facility] [address] [place], [address]   COLONOSCOPY PROCEDURE REPORT  PATIENT: Susan [name], Susan [name]  MR#: [id number] BIRTHDATE: 1960-10-05 , 51  yrs. old GENDER: Female ENDOSCOPIST: Beverley Fiedler, MD REFERRED [id number] Felicity Coyer, M.D. PROCEDURE DATE:  01/02/2013 PROCEDURE:   Colonoscopy with cold biopsy polypectomy ASA CLASS:   Class III INDICATIONS:average risk screening and first colonoscopy. MEDICATIONS: MAC sedation, administered by CRNA and See Anesthesia Report.  DESCRIPTION OF PROCEDURE:   After the risks benefits and alternatives of the procedure were thoroughly explained, informed consent was obtained.  A digital rectal exam revealed no rectal mass.   The Pentax Adult Colonscope [id number]  endoscope was introduced through the anus and advanced to the cecum, which was identified by both the appendix and ileocecal valve. No adverse events experienced.   The quality of the prep was Moviprep fair The instrument was then slowly withdrawn as the colon was fully examined.   COLON FINDINGS: A sessile polyp measuring 2 mm in size was found in rectum seen upon the retroflexed view.  A polypectomy was performed with cold forceps.  The resection was complete and the polyp tissue was completely retrieved.   The colon was otherwise normal.  There was no diverticulosis, inflammation, or cancers unless previously stated.  Retroflexed views revealed no abnormalities other than aforementioned polyp. The time to cecum=3 minutes 0 seconds. Withdrawal time=14 minutes 0 seconds.  The scope was withdrawn and the procedure completed. COMPLICATIONS: There were no complications.  ENDOSCOPIC IMPRESSION: 1.   Sessile polyp measuring 2 mm in size was found in rectum seen upon the retroflexed view; polypectomy was performed with cold forceps 2.   The colon was otherwise normal  RECOMMENDATIONS: 1.  Await pathology results 2.  If the polyp removed today is proven to be an  adenomatous (pre-cancerous) polyp, you will need a repeat colonoscopy in 5 years.  Otherwise you should continue to follow colorectal cancer screening guidelines for "routine risk" patients with colonoscopy in 10 years.  You will receive a letter within 1-2 weeks with the results of your biopsy as well as final recommendations.  Please call my office if you have not received a letter after 3 weeks.   eSigned:  Beverley Fiedler, MD 01/02/2013 12:57 PM  cc: The Patient

## 2013-01-05 ENCOUNTER — Encounter (HOSPITAL_COMMUNITY): Payer: Self-pay | Admitting: Internal Medicine

## 2013-01-05 ENCOUNTER — Encounter: Payer: Self-pay | Admitting: Internal Medicine

## 2013-01-20 ENCOUNTER — Other Ambulatory Visit: Payer: Medicare Other

## 2013-01-25 ENCOUNTER — Emergency Department (HOSPITAL_COMMUNITY)
Admission: EM | Admit: 2013-01-25 | Discharge: 2013-01-26 | Disposition: A | Discharge status: Other Acute Inpatient Hospital | Payer: Medicare Other | Attending: Emergency Medicine | Admitting: Emergency Medicine

## 2013-01-25 ENCOUNTER — Emergency Department (HOSPITAL_COMMUNITY): Payer: Medicare Other

## 2013-01-25 ENCOUNTER — Encounter (HOSPITAL_COMMUNITY): Payer: Self-pay | Admitting: Nurse Practitioner

## 2013-01-25 DIAGNOSIS — Z794 Long term (current) use of insulin: Secondary | ICD-10-CM | POA: Insufficient documentation

## 2013-01-25 DIAGNOSIS — F329 Major depressive disorder, single episode, unspecified: Secondary | ICD-10-CM

## 2013-01-25 DIAGNOSIS — Z79899 Other long term (current) drug therapy: Secondary | ICD-10-CM | POA: Insufficient documentation

## 2013-01-25 DIAGNOSIS — Z7982 Long term (current) use of aspirin: Secondary | ICD-10-CM | POA: Insufficient documentation

## 2013-01-25 DIAGNOSIS — Z8673 Personal history of transient ischemic attack (TIA), and cerebral infarction without residual deficits: Secondary | ICD-10-CM | POA: Insufficient documentation

## 2013-01-25 DIAGNOSIS — Z87891 Personal history of nicotine dependence: Secondary | ICD-10-CM | POA: Insufficient documentation

## 2013-01-25 DIAGNOSIS — F911 Conduct disorder, childhood-onset type: Secondary | ICD-10-CM | POA: Insufficient documentation

## 2013-01-25 DIAGNOSIS — Z9089 Acquired absence of other organs: Secondary | ICD-10-CM | POA: Insufficient documentation

## 2013-01-25 DIAGNOSIS — Z87828 Personal history of other (healed) physical injury and trauma: Secondary | ICD-10-CM | POA: Insufficient documentation

## 2013-01-25 DIAGNOSIS — R61 Generalized hyperhidrosis: Secondary | ICD-10-CM | POA: Insufficient documentation

## 2013-01-25 DIAGNOSIS — F39 Unspecified mood [affective] disorder: Secondary | ICD-10-CM | POA: Insufficient documentation

## 2013-01-25 DIAGNOSIS — R109 Unspecified abdominal pain: Secondary | ICD-10-CM | POA: Insufficient documentation

## 2013-01-25 DIAGNOSIS — R05 Cough: Secondary | ICD-10-CM | POA: Insufficient documentation

## 2013-01-25 DIAGNOSIS — IMO0002 Reserved for concepts with insufficient information to code with codable children: Secondary | ICD-10-CM | POA: Insufficient documentation

## 2013-01-25 DIAGNOSIS — R45850 Homicidal ideations: Secondary | ICD-10-CM | POA: Insufficient documentation

## 2013-01-25 DIAGNOSIS — J45909 Unspecified asthma, uncomplicated: Secondary | ICD-10-CM | POA: Insufficient documentation

## 2013-01-25 DIAGNOSIS — F3289 Other specified depressive episodes: Secondary | ICD-10-CM | POA: Insufficient documentation

## 2013-01-25 DIAGNOSIS — Z3202 Encounter for pregnancy test, result negative: Secondary | ICD-10-CM | POA: Insufficient documentation

## 2013-01-25 DIAGNOSIS — I10 Essential (primary) hypertension: Secondary | ICD-10-CM | POA: Insufficient documentation

## 2013-01-25 DIAGNOSIS — E119 Type 2 diabetes mellitus without complications: Secondary | ICD-10-CM | POA: Insufficient documentation

## 2013-01-25 DIAGNOSIS — Z21 Asymptomatic human immunodeficiency virus [HIV] infection status: Secondary | ICD-10-CM | POA: Insufficient documentation

## 2013-01-25 DIAGNOSIS — R059 Cough, unspecified: Secondary | ICD-10-CM | POA: Insufficient documentation

## 2013-01-25 DIAGNOSIS — R45851 Suicidal ideations: Secondary | ICD-10-CM | POA: Insufficient documentation

## 2013-01-25 DIAGNOSIS — Z9851 Tubal ligation status: Secondary | ICD-10-CM | POA: Insufficient documentation

## 2013-01-25 LAB — COMPREHENSIVE METABOLIC PANEL
ALT: 11 U/L (ref 0–35)
CO2: 26 mEq/L (ref 19–32)
Calcium: 9 mg/dL (ref 8.4–10.5)
Creatinine, Ser: 0.79 mg/dL (ref 0.50–1.10)
GFR calc Af Amer: >90 mL/min (ref >90)
GFR calc non Af Amer: >90 mL/min (ref >90)
Glucose, Bld: 105 mg/dL — ABNORMAL HIGH (ref 70–99)
Sodium: 136 mEq/L (ref 135–145)
Total Protein: 6.8 g/dL (ref 6.0–8.3)

## 2013-01-25 LAB — ETHANOL: Alcohol, Ethyl (B): <11 mg/dL (ref 0–11)

## 2013-01-25 LAB — RAPID URINE DRUG SCREEN, HOSP PERFORMED
Cocaine: NOT DETECTED
Opiates: NOT DETECTED
Tetrahydrocannabinol: POSITIVE — AB

## 2013-01-25 LAB — SALICYLATE LEVEL: Salicylate Lvl: <2 mg/dL — ABNORMAL LOW (ref 2.8–20.0)

## 2013-01-25 LAB — CBC
HCT: 42.1 % (ref 36.0–46.0)
MCV: 87.7 fL (ref 78.0–100.0)
Platelets: 302 10*3/uL (ref 150–400)
RBC: 4.8 MIL/uL (ref 3.87–5.11)
WBC: 8.1 10*3/uL (ref 4.0–10.5)

## 2013-01-25 IMAGING — CR DG CHEST 2V
2 series · 2 of 2 positions shown · non-contrast
Comparison: Two-view chest x-ray [DATE].

CLINICAL DATA: Suicidal ideation.  Generalized weakness.  Current
history of diabetes and hypertension.  Smoker.

CHEST - 2 VIEW

[w chest pa]
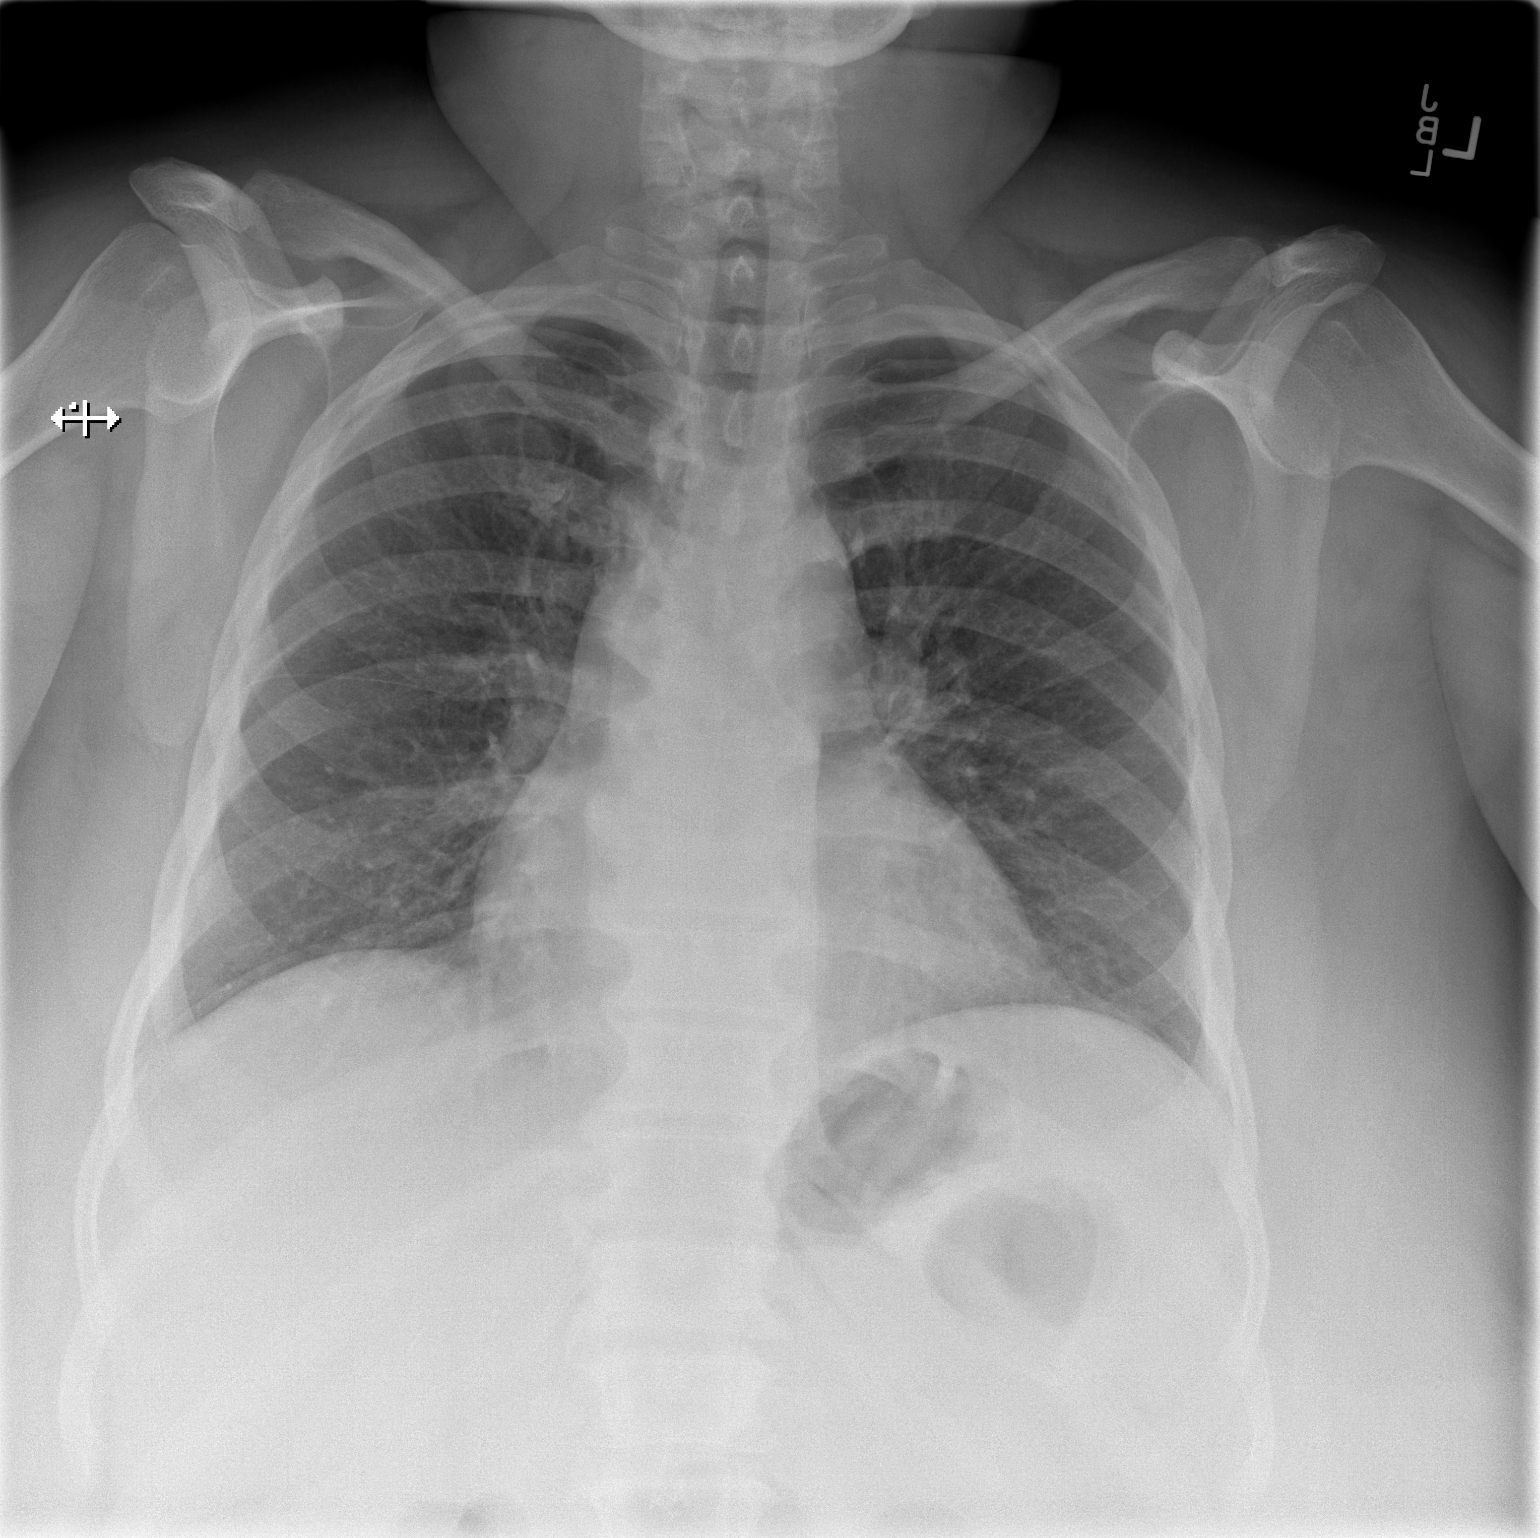

[w chest lat]
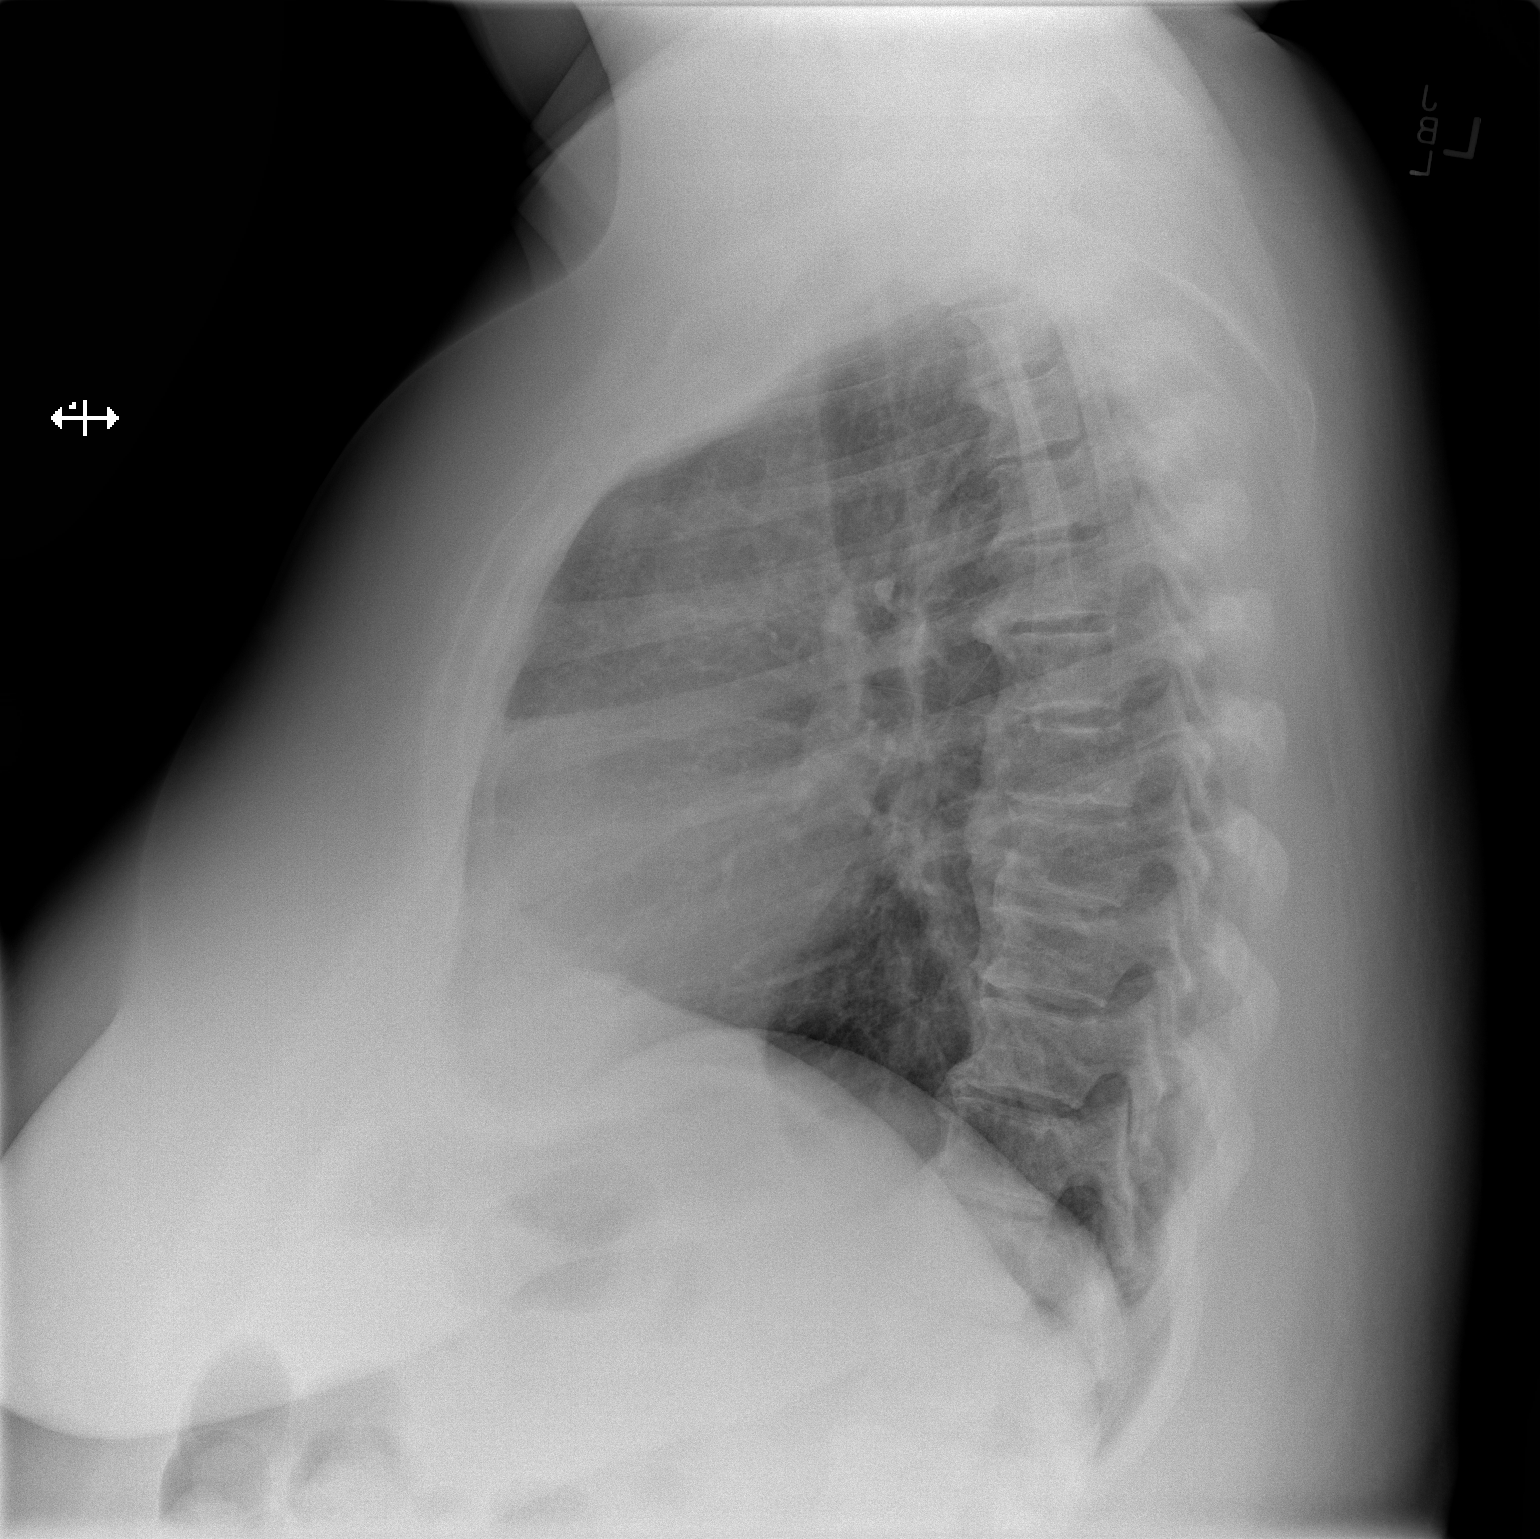

[2 of 2 positions shown; findings below may reference images not displayed]

FINDINGS: Suboptimal inspiration accounts for crowded
bronchovascular markings, especially in the lung bases, and
accentuates the cardiac silhouette.  Taking this into account,
cardiomediastinal silhouette unremarkable and unchanged.  Mildly
prominent bronchovascular markings and mild central peribronchial
thickening, unchanged.  Lungs otherwise clear.  Pulmonary
vascularity normal.  No pleural effusions.  No pneumothorax.
Degenerative changes and DISH involving the thoracic spine.  No
significant interval change.
IMPRESSION: Suboptimal inspiration.  Stable mild changes of chronic bronchitis
and/or asthma.  No acute cardiopulmonary disease.

## 2013-01-25 MED ORDER — CITALOPRAM HYDROBROMIDE 10 MG PO TABS
20.0000 mg | ORAL_TABLET | Freq: Every day | ORAL | Status: DC
  Administered 2013-01-25: 20 mg via ORAL
  Filled 2013-01-25: qty 2

## 2013-01-25 MED ORDER — AMLODIPINE BESYLATE 5 MG PO TABS
5.0000 mg | ORAL_TABLET | Freq: Every day | ORAL | Status: DC
  Administered 2013-01-25: 5 mg via ORAL
  Filled 2013-01-25: qty 1

## 2013-01-25 MED ORDER — ACETAMINOPHEN 325 MG PO TABS: 650.0000 mg | ORAL_TABLET | ORAL | Status: DC | PRN

## 2013-01-25 MED ORDER — EFAVIRENZ-EMTRICITAB-TENOFOVIR 600-200-300 MG PO TABS
1.0000 | ORAL_TABLET | Freq: Every day | ORAL | Status: DC
  Administered 2013-01-25: 1 via ORAL
  Filled 2013-01-25: qty 1

## 2013-01-25 MED ORDER — IBUPROFEN 400 MG PO TABS: 600.0000 mg | ORAL_TABLET | Freq: Three times a day (TID) | ORAL | Status: DC | PRN

## 2013-01-25 MED ORDER — ALUM & MAG HYDROXIDE-SIMETH 200-200-20 MG/5ML PO SUSP: 30.0000 mL | ORAL | Status: DC | PRN

## 2013-01-25 MED ORDER — PANTOPRAZOLE SODIUM 40 MG PO TBEC
40.0000 mg | DELAYED_RELEASE_TABLET | Freq: Every day | ORAL | Status: DC
  Administered 2013-01-25: 40 mg via ORAL
  Filled 2013-01-25: qty 1

## 2013-01-25 MED ORDER — ZOLPIDEM TARTRATE 5 MG PO TABS: 5.0000 mg | ORAL_TABLET | Freq: Every evening | ORAL | Status: DC | PRN

## 2013-01-25 MED ORDER — CLONIDINE HCL 0.2 MG PO TABS
0.2000 mg | ORAL_TABLET | Freq: Two times a day (BID) | ORAL | Status: DC
  Administered 2013-01-25: 0.2 mg via ORAL
  Filled 2013-01-25: qty 1

## 2013-01-25 MED ORDER — ATENOLOL 25 MG PO TABS
25.0000 mg | ORAL_TABLET | Freq: Every day | ORAL | Status: DC
  Filled 2013-01-25: qty 1

## 2013-01-25 MED ORDER — INSULIN GLARGINE 100 UNIT/ML ~~LOC~~ SOLN
40.0000 [IU] | Freq: Every morning | SUBCUTANEOUS | Status: DC
  Filled 2013-01-25: qty 0.4

## 2013-01-25 MED ORDER — NICOTINE 21 MG/24HR TD PT24: 21.0000 mg | MEDICATED_PATCH | Freq: Every day | TRANSDERMAL | Status: DC

## 2013-01-25 MED ORDER — MOMETASONE FURO-FORMOTEROL FUM 200-5 MCG/ACT IN AERO
2.0000 | INHALATION_SPRAY | Freq: Two times a day (BID) | RESPIRATORY_TRACT | Status: DC
  Administered 2013-01-25: 2 via RESPIRATORY_TRACT
  Filled 2013-01-25: qty 8.8

## 2013-01-25 MED ORDER — TRAZODONE HCL 50 MG PO TABS
150.0000 mg | ORAL_TABLET | Freq: Every day | ORAL | Status: DC
  Administered 2013-01-25: 150 mg via ORAL
  Filled 2013-01-25: qty 3

## 2013-01-25 MED ORDER — ASPIRIN EC 81 MG PO TBEC: 81.0000 mg | DELAYED_RELEASE_TABLET | Freq: Every day | ORAL | Status: DC

## 2013-01-25 MED ORDER — ONDANSETRON HCL 4 MG PO TABS: 4.0000 mg | ORAL_TABLET | Freq: Three times a day (TID) | ORAL | Status: DC | PRN

## 2013-01-25 MED ORDER — ARIPIPRAZOLE 15 MG PO TABS
30.0000 mg | ORAL_TABLET | Freq: Every day | ORAL | Status: DC
  Administered 2013-01-25: 30 mg via ORAL
  Filled 2013-01-25: qty 2

## 2013-01-25 MED ORDER — SIMVASTATIN 40 MG PO TABS
40.0000 mg | ORAL_TABLET | Freq: Every day | ORAL | Status: DC
  Administered 2013-01-25: 40 mg via ORAL
  Filled 2013-01-25 (×3): qty 1

## 2013-01-25 MED ORDER — LORAZEPAM 1 MG PO TABS: 1.0000 mg | ORAL_TABLET | Freq: Three times a day (TID) | ORAL | Status: DC | PRN

## 2013-01-25 MED ORDER — ENALAPRIL MALEATE 20 MG PO TABS: 20.0000 mg | ORAL_TABLET | Freq: Every day | ORAL | Status: DC
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
## 2013-01-25 NOTE — ED Notes (Signed)
CARB MOD DIET ORDERED FOR PT. PT CAN COME TO C-21 WHEN MEDICALLY CLEAR

## 2013-01-26 NOTE — ED Notes (Signed)
Spoke with Nurse at [facility]. Pt. Will be going to C509.

## 2013-01-26 NOTE — ED Notes (Signed)
[facility] called and made aware of need to transport to [facility].

## 2013-01-26 NOTE — BH Assessment (Signed)
[facility] Assessment Progress Note      01/26/13, 0005.  Pt accepted to [facility] by Ronn Melena, per [facility] in assessment office. Daleen Squibb, LCSWA

## 2013-01-26 NOTE — ED Provider Notes (Signed)
Accepted by Dr. Pam Drown High Pt. Pt agrees to transfer.  Hurman Horn, MD 01/26/13 [phone]

## 2013-02-03 ENCOUNTER — Ambulatory Visit (INDEPENDENT_AMBULATORY_CARE_PROVIDER_SITE_OTHER): Payer: Medicare Other | Admitting: Internal Medicine

## 2013-02-03 ENCOUNTER — Encounter: Payer: Self-pay | Admitting: Internal Medicine

## 2013-02-03 VITALS — BP 174/89 | HR 69 | Temp 98.3°F | Wt 276.0 lb

## 2013-02-03 DIAGNOSIS — B20 Human immunodeficiency virus [HIV] disease: Secondary | ICD-10-CM

## 2013-02-03 NOTE — Assessment & Plan Note (Signed)
I will check her labs today but suspects she continues to do well. She will be called if there are any concerns from today's labs otherwise she'll follow up in 4 or 5 months.

## 2013-02-03 NOTE — Progress Notes (Signed)
  Subjective:    Patient ID: Susan [name], female    DOB: [birth year], 52 y.o.   MRN: [id number]  HPI She comes in for followup of her HIV. She continues on Atripla which she has been on for a long time. She reports excellent compliance and no side effects related to her psychiatric illnesses or drug addiction. She is now seeing a psychiatrist and is prescribed appropriate medications. For her Atripla, she denies any missed doses and good tolerance to the medication. She is getting labs today. She was recently hospitalized after losing her father. She came to the [facility] emergency department and was transferred to [facility] for psychiatric care. She otherwise is doing well. She is pleasant and happy today. She is losing weight purposefully. No diarrhea or other issues   Review of Systems  Constitutional: Negative for fatigue.  HENT: Negative for sore throat and trouble swallowing.   Gastrointestinal: Negative for diarrhea.  Musculoskeletal: Negative for myalgias.  Skin: Negative for rash.  Neurological: Negative for dizziness and headaches.       Objective:   Physical Exam  Constitutional: She appears well-developed and well-nourished. No distress.  HENT:  Mouth/Throat: Oropharynx is clear and moist. No oropharyngeal exudate.  Cardiovascular: Normal rate, regular rhythm and normal heart sounds.   No murmur heard. Pulmonary/Chest: Effort normal and breath sounds normal. No respiratory distress. She has no wheezes.  Lymphadenopathy:    She has no cervical adenopathy.          Assessment & Plan:

## 2013-02-04 LAB — HIV-1 RNA QUANT-NO REFLEX-BLD
HIV 1 RNA Quant: <20 copies/mL (ref <20)
HIV-1 RNA Quant, Log: <1.3 {Log} (ref <1.30)

## 2013-02-04 LAB — T-HELPER CELL (CD4) - (RCID CLINIC ONLY): CD4 T Cell Abs: 700 uL (ref 400–2700)

## 2013-02-13 ENCOUNTER — Other Ambulatory Visit: Payer: Self-pay | Admitting: Internal Medicine

## 2013-03-19 ENCOUNTER — Encounter: Payer: Self-pay | Admitting: Internal Medicine

## 2013-03-21 ENCOUNTER — Other Ambulatory Visit: Payer: Self-pay | Admitting: Internal Medicine

## 2013-04-10 ENCOUNTER — Ambulatory Visit: Payer: Medicare Other | Admitting: Internal Medicine

## 2013-04-13 ENCOUNTER — Ambulatory Visit: Payer: Medicare Other | Admitting: Internal Medicine

## 2013-04-18 ENCOUNTER — Other Ambulatory Visit: Payer: Self-pay | Admitting: Internal Medicine

## 2013-04-20 ENCOUNTER — Other Ambulatory Visit: Payer: Self-pay | Admitting: *Deleted

## 2013-04-20 MED ORDER — AMLODIPINE BESYLATE 5 MG PO TABS: 5.0000 mg | ORAL_TABLET | Freq: Every day | ORAL | Status: DC

## 2013-04-20 MED ORDER — SIMVASTATIN 40 MG PO TABS: 40.0000 mg | ORAL_TABLET | Freq: Every evening | ORAL | Status: DC

## 2013-05-06 ENCOUNTER — Ambulatory Visit: Payer: Medicare Other | Admitting: Internal Medicine

## 2013-05-23 ENCOUNTER — Other Ambulatory Visit: Payer: Self-pay | Admitting: Internal Medicine

## 2013-05-24 ENCOUNTER — Other Ambulatory Visit: Payer: Self-pay | Admitting: Endocrinology

## 2013-05-26 ENCOUNTER — Ambulatory Visit (INDEPENDENT_AMBULATORY_CARE_PROVIDER_SITE_OTHER): Payer: Medicare Other | Admitting: Internal Medicine

## 2013-05-26 ENCOUNTER — Other Ambulatory Visit (INDEPENDENT_AMBULATORY_CARE_PROVIDER_SITE_OTHER): Payer: Medicare Other

## 2013-05-26 ENCOUNTER — Encounter: Payer: Self-pay | Admitting: Internal Medicine

## 2013-05-26 VITALS — BP 112/72 | HR 59 | Temp 98.3°F | Wt 274.0 lb

## 2013-05-26 DIAGNOSIS — J309 Allergic rhinitis, unspecified: Secondary | ICD-10-CM

## 2013-05-26 DIAGNOSIS — Z23 Encounter for immunization: Secondary | ICD-10-CM

## 2013-05-26 DIAGNOSIS — F259 Schizoaffective disorder, unspecified: Secondary | ICD-10-CM

## 2013-05-26 DIAGNOSIS — E1149 Type 2 diabetes mellitus with other diabetic neurological complication: Secondary | ICD-10-CM

## 2013-05-26 DIAGNOSIS — E785 Hyperlipidemia, unspecified: Secondary | ICD-10-CM

## 2013-05-26 LAB — LIPID PANEL
Cholesterol: 192 mg/dL (ref 0–200)
HDL: 44.7 mg/dL (ref >39.00)
Total CHOL/HDL Ratio: 4
Triglycerides: 214 mg/dL — ABNORMAL HIGH (ref 0.0–149.0)
VLDL: 42.8 mg/dL — ABNORMAL HIGH (ref 0.0–40.0)

## 2013-05-26 LAB — HEMOGLOBIN A1C: Hgb A1c MFr Bld: 12 % — ABNORMAL HIGH (ref 4.6–6.5)

## 2013-05-26 LAB — LDL CHOLESTEROL, DIRECT: Direct LDL: 127.7 mg/dL

## 2013-05-26 MED ORDER — INSULIN GLARGINE 100 UNIT/ML SOLOSTAR PEN: 60.0000 [IU] | PEN_INJECTOR | Freq: Every day | SUBCUTANEOUS | Status: DC

## 2013-05-26 MED ORDER — KETOTIFEN FUMARATE 0.025 % OP SOLN: 1.0000 [drp] | Freq: Two times a day (BID) | OPHTHALMIC | Status: DC

## 2013-05-26 MED ORDER — FLUCONAZOLE 150 MG PO TABS: 150.0000 mg | ORAL_TABLET | Freq: Once | ORAL | Status: DC

## 2013-05-26 MED ORDER — CETIRIZINE HCL 10 MG PO TABS: 10.0000 mg | ORAL_TABLET | Freq: Every day | ORAL | Status: DC

## 2013-05-26 NOTE — Addendum Note (Signed)
Addended by: Rene Paci A on: 05/26/2013 04:11 PM   Modules accepted: Orders, Medications

## 2013-05-26 NOTE — Assessment & Plan Note (Signed)
Stable on current meds - following with [facility] psy[facility] provider No longer on Seroquel - Abilify, celexa and traz ongoing The current medical regimen is effective;  continue present plan and medications.

## 2013-05-26 NOTE — Assessment & Plan Note (Signed)
On simva -dose increased from 20 to 40 qd 10/2012 Check lipids annually and titrate as needed 
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
## 2013-05-26 NOTE — Assessment & Plan Note (Signed)
Add allergy eyedrops and oral antihistamine for seasonal symptoms - erx done

## 2013-05-26 NOTE — Assessment & Plan Note (Signed)
Dx 10/2010 in [place] during hospitalization for HONK - on insulin +met until 01/2011 -then metformin only Now intermittent working with endo: amaryl, januvia + metformin changed to Lantus 07/2012 Encouraged efforts at weight loss as ongoing with diet and exercise On ACE, added statin and ASA 04/2011, esp with TIA hx 2010 Lab Results  Component Value Date   HGBA1C 10.7* 10/10/2012

## 2013-05-26 NOTE — Patient Instructions (Signed)
It was good to see you today. We have reviewed your prior records including labs and tests today Your annual flu shot was given and/or updated today. Medications reviewed and updated -begin Zyrtec once daily for allergies and the Zyrtec allergy eyedrops twice daily as needed allergy symptoms -  Your prescription(s) have been submitted to your pharmacy. Please take as directed and contact our office if you believe you are having problem(s) with the medication(s). Test(s) ordered today. Your results will be released to MyChart (or called to you) after review, usually within 72hours after test completion. If any changes need to be made, you will be notified at that same time. Continue working with your other specialists as ongoing  Continue to work on lifestyle changes as discussed (low fat, low carb, increased protein diet; improved exercise efforts; weight loss) to control sugar, blood pressure and cholesterol levels and/or reduce risk of developing other medical problems. Look into [email or web address] or other type of food journal to assist you in this process. Please schedule followup in 4 months for diabetes mellitus, blood pressure and weight check, call sooner if problems.

## 2013-05-27 ENCOUNTER — Telehealth: Payer: Self-pay | Admitting: *Deleted

## 2013-05-27 NOTE — Telephone Encounter (Signed)
A user error has taken place.

## 2013-05-27 NOTE — Telephone Encounter (Signed)
Because these medications or over-the-counter, there is no prescription substitute.

## 2013-05-27 NOTE — Telephone Encounter (Signed)
Notified pt with md response.../lmb 

## 2013-05-27 NOTE — Telephone Encounter (Signed)
Called pt concerning her labs results. Pt states she had called the office and left msg with someone, but the eye drops nor the zyrtec is not covered by her medicare/medicaid. Will not have any income until the first of next month. Requesting md to rx something that is covered by her insurance,,,lmb

## 2013-05-29 ENCOUNTER — Telehealth: Payer: Self-pay | Admitting: Internal Medicine

## 2013-05-29 NOTE — Telephone Encounter (Signed)
Patient Information:  Caller Name: Idy  Phone: [phone]  Patient: Susan [name], Susan [name]  Gender: Female  DOB: 05/26/1951  Age: 52 Years  PCP: Rene Paci (Adults only)  Office Follow Up:  Does the office need to follow up with this patient?: Yes  Instructions For The Office: Offered and appt. Pt states she unable to find transportation today and would like something called in. Please advise.  RN Note:  Offered and appt. Pt states she unable to find transportation today and would like something called in. Please advise.  Symptoms  Reason For Call & Symptoms: Pt calling regarding painful menstrual cycle. Wears pads; has to change pad every hr, about 30 % covered. Pain is a 10/10 on pain scale. Asking for something to ease her pain. Called her GYN this am. Can not see her for a month.  Reviewed Health History In EMR: Yes  Reviewed Medications In EMR: Yes  Reviewed Allergies In EMR: Yes  Reviewed Surgeries / Procedures: Yes  Date of Onset of Symptoms: 05/27/2013  Treatments Tried: Advil, 3, 200 mg po every 6-8hrs prn for pain.  Treatments Tried Worked: No  Guideline(s) Used:  Abdominal Pain - Female  Abdominal Pain - Menstrual Cramps  Disposition Per Guideline:   Call Transferred to PCP Now  Reason For Disposition Reached:   Severe pain (e.g., excruciating cramps) and worse than ever before and not improved with ibuprofen or naproxen  Advice Given:  N/A  Patient Will Follow Care Advice:  YES

## 2013-05-30 NOTE — Telephone Encounter (Signed)
Pt to call her gyn provider and continue NSAIDs as ongoing for now without change

## 2013-06-01 NOTE — Telephone Encounter (Signed)
Spoke with pt advised of MDs message 

## 2013-06-03 ENCOUNTER — Ambulatory Visit (INDEPENDENT_AMBULATORY_CARE_PROVIDER_SITE_OTHER): Payer: Medicare Other | Admitting: Endocrinology

## 2013-06-03 ENCOUNTER — Encounter: Payer: Self-pay | Admitting: Endocrinology

## 2013-06-03 VITALS — BP 132/80 | HR 67 | Ht 63.0 in | Wt 277.0 lb

## 2013-06-03 DIAGNOSIS — G609 Hereditary and idiopathic neuropathy, unspecified: Secondary | ICD-10-CM

## 2013-06-03 DIAGNOSIS — E1142 Type 2 diabetes mellitus with diabetic polyneuropathy: Secondary | ICD-10-CM

## 2013-06-03 DIAGNOSIS — B20 Human immunodeficiency virus [HIV] disease: Secondary | ICD-10-CM

## 2013-06-03 DIAGNOSIS — E1149 Type 2 diabetes mellitus with other diabetic neurological complication: Secondary | ICD-10-CM

## 2013-06-03 DIAGNOSIS — Z21 Asymptomatic human immunodeficiency virus [HIV] infection status: Secondary | ICD-10-CM

## 2013-06-03 MED ORDER — INSULIN GLARGINE 100 UNIT/ML SOLOSTAR PEN: 80.0000 [IU] | PEN_INJECTOR | SUBCUTANEOUS | Status: DC

## 2013-06-03 NOTE — Patient Instructions (Addendum)
Please increase the insulin to 80 units daily. Please come back for a follow-up appointment in 1 month. check your blood sugar twice a day.  vary the time of day when you check, between before the 3 meals, and at bedtime.  also check if you have symptoms of your blood sugar being too high or too low.  please keep a record of the readings and bring it to your next appointment here.  please call us sooner if your blood sugar goes below 70, or if you have a lot of readings over 200.  Refer to a pain specialist.  you will receive a phone call, about a day and time for an appointment.
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
## 2013-06-04 ENCOUNTER — Other Ambulatory Visit: Payer: Medicare Other

## 2013-06-09 ENCOUNTER — Other Ambulatory Visit: Payer: Medicare Other

## 2013-06-10 ENCOUNTER — Other Ambulatory Visit: Payer: Medicare Other

## 2013-06-10 DIAGNOSIS — B20 Human immunodeficiency virus [HIV] disease: Secondary | ICD-10-CM

## 2013-06-10 DIAGNOSIS — Z113 Encounter for screening for infections with a predominantly sexual mode of transmission: Secondary | ICD-10-CM

## 2013-06-10 LAB — RPR

## 2013-06-11 LAB — T-HELPER CELL (CD4) - (RCID CLINIC ONLY): CD4 T Cell Abs: 500 /uL (ref 400–2700)

## 2013-06-11 LAB — HIV-1 RNA QUANT-NO REFLEX-BLD: HIV 1 RNA Quant: <20 copies/mL (ref <20)

## 2013-06-18 ENCOUNTER — Encounter: Payer: Self-pay | Admitting: Internal Medicine

## 2013-06-18 ENCOUNTER — Ambulatory Visit (INDEPENDENT_AMBULATORY_CARE_PROVIDER_SITE_OTHER): Payer: Medicare Other | Admitting: Internal Medicine

## 2013-06-18 VITALS — Temp 98.0°F | Ht 63.0 in | Wt 275.0 lb

## 2013-06-18 DIAGNOSIS — F339 Major depressive disorder, recurrent, unspecified: Secondary | ICD-10-CM | POA: Insufficient documentation

## 2013-06-18 DIAGNOSIS — B20 Human immunodeficiency virus [HIV] disease: Secondary | ICD-10-CM

## 2013-06-18 MED ORDER — ELVITEG-COBIC-EMTRICIT-TENOFDF 150-150-200-300 MG PO TABS: 1.0000 | ORAL_TABLET | Freq: Every day | ORAL | Status: DC

## 2013-06-18 NOTE — Progress Notes (Signed)
  Subjective:    Patient ID: Susan [name], female    DOB: 11/29/1960, 52 y.o.   MRN: [id number]  HPI  She comes in for followup of her HIV. She continues on Atripla which she has been on for a long time. She reports excellent compliance and no side effects related to her psychiatric illnesses or drug addiction. She is now seeing a psychiatrist and is prescribed appropriate medications, though is having trouble with depression. For her Atripla, she denies any missed doses and good tolerance to the medication. CD4 500, viral load undetectable still. She otherwise is doing well. She is pleasant and happy today. She is losing weight purposefully. No diarrhea or other issues   Review of Systems  Constitutional: Negative for fatigue.  HENT: Negative for sore throat and trouble swallowing.   Gastrointestinal: Negative for diarrhea.  Musculoskeletal: Negative for myalgias.  Skin: Negative for rash.  Neurological: Negative for dizziness and headaches.       Objective:   Physical Exam  Constitutional: She appears well-developed and well-nourished. No distress.  HENT:  Mouth/Throat: Oropharynx is clear and moist. No oropharyngeal exudate.  Cardiovascular: Normal rate, regular rhythm and normal heart sounds.   No murmur heard. Pulmonary/Chest: Effort normal and breath sounds normal. No respiratory distress. She has no wheezes.  Lymphadenopathy:    She has no cervical adenopathy.          Assessment & Plan:

## 2013-06-18 NOTE — Assessment & Plan Note (Signed)
She is getting appropriate counseling, hopefully with medication change, she also will note a difference.

## 2013-06-18 NOTE — Assessment & Plan Note (Signed)
Doing well but with depression, will change to Stribild and see if that helps.

## 2013-06-18 NOTE — Assessment & Plan Note (Signed)
Encouraged continued weight loss.

## 2013-06-23 ENCOUNTER — Other Ambulatory Visit: Payer: Self-pay | Admitting: Internal Medicine

## 2013-07-06 ENCOUNTER — Encounter: Payer: Self-pay | Admitting: Endocrinology

## 2013-07-06 ENCOUNTER — Ambulatory Visit (INDEPENDENT_AMBULATORY_CARE_PROVIDER_SITE_OTHER): Payer: Medicare Other | Admitting: Endocrinology

## 2013-07-06 VITALS — BP 132/90 | HR 73 | Temp 98.5°F | Resp 12 | Ht 63.0 in | Wt 274.9 lb

## 2013-07-06 DIAGNOSIS — E1149 Type 2 diabetes mellitus with other diabetic neurological complication: Secondary | ICD-10-CM

## 2013-07-06 MED ORDER — FLUCONAZOLE 150 MG PO TABS: 150.0000 mg | ORAL_TABLET | Freq: Every day | ORAL | Status: DC

## 2013-07-06 MED ORDER — INSULIN DETEMIR 100 UNIT/ML FLEXPEN: 120.0000 [IU] | PEN_INJECTOR | SUBCUTANEOUS | Status: DC

## 2013-07-06 NOTE — Patient Instructions (Addendum)
i have sent a prescription to your pharmacy, to change the insulin to one that works more later in the day, and less in the morning.   Please come back for a follow-up appointment in 1 month. check your blood sugar twice a day.  vary the time of day when you check, between before the 3 meals, and at bedtime.  also check if you have symptoms of your blood sugar being too high or too low.  please keep a record of the readings and bring it to your next appointment here.  please call us sooner if your blood sugar goes below 70, or if you have a lot of readings over 200.  i have sent a prescription to your pharmacy, for the yeast infection.  On days you take this, you should skip the citalopram and simvastatin.
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
## 2013-07-09 ENCOUNTER — Other Ambulatory Visit: Payer: Medicare Other

## 2013-07-09 DIAGNOSIS — B20 Human immunodeficiency virus [HIV] disease: Secondary | ICD-10-CM

## 2013-07-09 LAB — COMPLETE METABOLIC PANEL WITH GFR
ALT: 14 U/L (ref 0–35)
Albumin: 3.5 g/dL (ref 3.5–5.2)
Alkaline Phosphatase: 129 U/L — ABNORMAL HIGH (ref 39–117)
BUN: 7 mg/dL (ref 6–23)
CO2: 28 mEq/L (ref 19–32)
Calcium: 8.9 mg/dL (ref 8.4–10.5)
Chloride: 100 mEq/L (ref 96–112)
Creat: 0.72 mg/dL (ref 0.50–1.10)
GFR, Est Non African American: >89 mL/min
Glucose, Bld: 341 mg/dL — ABNORMAL HIGH (ref 70–99)
Sodium: 137 mEq/L (ref 135–145)

## 2013-07-09 LAB — CBC WITH DIFFERENTIAL/PLATELET
Basophils Absolute: 0 10*3/uL (ref 0.0–0.1)
Eosinophils Absolute: 0.2 10*3/uL (ref 0.0–0.7)
Eosinophils Relative: 3 % (ref 0–5)
Lymphocytes Relative: 26 % (ref 12–46)
Lymphs Abs: 2.1 10*3/uL (ref 0.7–4.0)
MCH: 30.2 pg (ref 26.0–34.0)
Monocytes Absolute: 0.5 10*3/uL (ref 0.1–1.0)
Neutrophils Relative %: 65 % (ref 43–77)
Platelets: 208 10*3/uL (ref 150–400)
RBC: 5.14 MIL/uL — ABNORMAL HIGH (ref 3.87–5.11)
RDW: 13.6 % (ref 11.5–15.5)
WBC: 8.1 10*3/uL (ref 4.0–10.5)

## 2013-07-10 LAB — T-HELPER CELL (CD4) - (RCID CLINIC ONLY)
CD4 % Helper T Cell: 22 % — ABNORMAL LOW (ref 33–55)
CD4 T Cell Abs: 540 /uL (ref 400–2700)

## 2013-07-10 LAB — HIV-1 RNA QUANT-NO REFLEX-BLD
HIV 1 RNA Quant: <20 copies/mL (ref <20)
HIV-1 RNA Quant, Log: <1.3 {Log} (ref <1.30)

## 2013-07-16 ENCOUNTER — Encounter: Payer: Self-pay | Admitting: Internal Medicine

## 2013-07-16 ENCOUNTER — Ambulatory Visit (INDEPENDENT_AMBULATORY_CARE_PROVIDER_SITE_OTHER): Payer: Medicare Other | Admitting: Internal Medicine

## 2013-07-16 VITALS — BP 194/94 | HR 64 | Temp 97.9°F | Ht 63.0 in | Wt 272.0 lb

## 2013-07-16 DIAGNOSIS — B20 Human immunodeficiency virus [HIV] disease: Secondary | ICD-10-CM

## 2013-07-16 NOTE — Assessment & Plan Note (Signed)
She is doing well with his new regimen and continues to be undetectable. She will return in 3 months.

## 2013-07-16 NOTE — Progress Notes (Signed)
  Subjective:    Patient ID: Susan [name], female    DOB: [birth year], 52 y.o.   MRN: [id number]  HPI  She comes in for followup of her HIV. She was changed last visit to Stribild do to her underlying psychiatric illnesses. She has noted a significant improvement in energy. No big change in anxiety or depression but she feels much better. She continues to lose weight and work on her exercise and weight loss for her diabetes. Is pleased with the new regimen.   Review of Systems  Constitutional: Negative for fatigue.  HENT: Negative for sore throat and trouble swallowing.   Gastrointestinal: Negative for diarrhea.  Musculoskeletal: Negative for myalgias.  Skin: Negative for rash.  Neurological: Negative for dizziness and headaches.       Objective:   Physical Exam  Constitutional: She appears well-developed and well-nourished. No distress.  HENT:  Mouth/Throat: Oropharynx is clear and moist. No oropharyngeal exudate.  Cardiovascular: Normal rate, regular rhythm and normal heart sounds.   No murmur heard. Pulmonary/Chest: Effort normal and breath sounds normal. No respiratory distress. She has no wheezes.  Lymphadenopathy:    She has no cervical adenopathy.          Assessment & Plan:

## 2013-07-23 ENCOUNTER — Other Ambulatory Visit: Payer: Self-pay | Admitting: Internal Medicine

## 2013-07-23 DIAGNOSIS — F329 Major depressive disorder, single episode, unspecified: Secondary | ICD-10-CM

## 2013-07-23 MED ORDER — CLONIDINE HCL 0.2 MG PO TABS: ORAL_TABLET | ORAL | Status: DC

## 2013-07-23 NOTE — Addendum Note (Signed)
Addended by: Deatra James on: 07/23/2013 09:13 AM   Modules accepted: Orders

## 2013-07-23 NOTE — Telephone Encounter (Signed)
To lucy, VAL pt 

## 2013-08-05 ENCOUNTER — Ambulatory Visit: Payer: Medicare Other | Admitting: Endocrinology

## 2013-08-21 ENCOUNTER — Other Ambulatory Visit: Payer: Self-pay | Admitting: Internal Medicine

## 2013-08-24 ENCOUNTER — Telehealth: Payer: Self-pay | Admitting: *Deleted

## 2013-08-24 NOTE — Telephone Encounter (Signed)
Patient requesting refill of Flexeril 5mg  #30.  Please advise if appropriate. Andree Coss, RN

## 2013-09-22 ENCOUNTER — Other Ambulatory Visit (INDEPENDENT_AMBULATORY_CARE_PROVIDER_SITE_OTHER): Payer: Medicare Other

## 2013-09-22 ENCOUNTER — Ambulatory Visit (INDEPENDENT_AMBULATORY_CARE_PROVIDER_SITE_OTHER): Payer: Medicare Other | Admitting: Internal Medicine

## 2013-09-22 ENCOUNTER — Encounter: Payer: Self-pay | Admitting: Internal Medicine

## 2013-09-22 VITALS — BP 130/86 | HR 60 | Temp 98.4°F | Wt 276.8 lb

## 2013-09-22 DIAGNOSIS — J45901 Unspecified asthma with (acute) exacerbation: Secondary | ICD-10-CM

## 2013-09-22 DIAGNOSIS — E1149 Type 2 diabetes mellitus with other diabetic neurological complication: Secondary | ICD-10-CM

## 2013-09-22 DIAGNOSIS — B20 Human immunodeficiency virus [HIV] disease: Secondary | ICD-10-CM

## 2013-09-22 DIAGNOSIS — G609 Hereditary and idiopathic neuropathy, unspecified: Secondary | ICD-10-CM

## 2013-09-22 DIAGNOSIS — I10 Essential (primary) hypertension: Secondary | ICD-10-CM

## 2013-09-22 LAB — HEMOGLOBIN A1C: Hgb A1c MFr Bld: 12.3 % — ABNORMAL HIGH (ref 4.6–6.5)

## 2013-09-22 MED ORDER — PREGABALIN 75 MG PO CAPS: 75.0000 mg | ORAL_CAPSULE | Freq: Two times a day (BID) | ORAL | Status: DC

## 2013-09-22 MED ORDER — METHYLPREDNISOLONE ACETATE 80 MG/ML IJ SUSP
80.0000 mg | Freq: Once | INTRAMUSCULAR | Status: AC
  Administered 2013-09-22: 80 mg via INTRAMUSCULAR

## 2013-09-22 NOTE — Assessment & Plan Note (Signed)
Dx 10/2010 in [place] during hospitalization for [place] -  on insulin +met until 01/2011 -then metformin only Now intermittent working with endo: hx amaryl, januvia, metformin> changed to Lantus 07/2012 Encouraged efforts at weight loss as ongoing with diet and exercise Check a1c q 3-98moOn ACE, added statin and ASA 04/2011, esp with TIA hx 2010 Lab Results  Component Value Date   HGBA1C 12.0* 05/26/2013

## 2013-09-22 NOTE — Assessment & Plan Note (Signed)
subop control - addition of clonidine by ID 03/2011, will increase dose now (pt taking only qd, instructed on BID) increased ACEI to max dose 04/2011-  Avoid increase beta-blocker dose due to borderline bradycardia and asthma increased dose amlodipine 01/2012 Continue follow up and titration, esp with TIA hx and other co morbities May also improve with diet/exercise and weight loss BP Readings from Last 3 Encounters:  09/22/13 130/86  07/16/13 194/94  07/06/13 132/90

## 2013-09-22 NOTE — Assessment & Plan Note (Signed)
Bilateral chronic foot pain, suspect component of diabetic nephropathy as well as ?med effect Has tried gabapentin without successful relief of symptoms in the past Will treat with Lyrica, titrate as needed

## 2013-09-22 NOTE — Assessment & Plan Note (Signed)
Dx 2006 - On tx since 2010 and follows with ID for same - undetectable VL;  Change in anti-viral regimen October 2014 reviewed, remains undetectable The current medical regimen is effective;  continue present plan and medications.
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
## 2013-09-22 NOTE — Patient Instructions (Addendum)
It was good to see you today.  We have reviewed your prior records including labs and tests today  Injection of Medrol given to you today for asthma exacerbation causing cough  Use your nebulizer and asthma medications as prescribed, call if cough unimproved in next 2 weeks  Plan your smoking quit date as discussed. I want you to be done smoking by time of our next visit in the summer 2015  Test(s) ordered today. Your results will be released to [place] (or called to you) after review, usually within 72hours after test completion. If any changes need to be made, you will be notified at that same time.  Medications reviewed and updated, Lyrica twice daily for neuropathic pain in your feet. No other changes recommended at this time.  Please schedule followup in 4 months, call sooner if problems.  Diabetes and Standards of Medical Care  Diabetes is complicated. You may find that your diabetes team includes a dietitian, nurse, diabetes educator, eye doctor, and more. To help everyone know what is going on and to help you get the care you deserve, the following schedule of care was developed to help keep you on track. Below are the tests, exams, vaccines, medicines, education, and plans you will need. HbA1c test This test shows how well you have controlled your glucose over the past 2 3 months. It is used to see if your diabetes management plan needs to be adjusted.   It is performed at least 2 times a year if you are meeting treatment goals.  It is performed 4 times a year if therapy has changed or if you are not meeting treatment goals. Blood pressure test  This test is performed at every routine medical visit. The goal is less than 140/90 mmHg for most people, but 130/80 mmHg in some cases. Ask your health care provider about your goal. Dental exam  Follow up with the dentist regularly. Eye exam  If you are diagnosed with type 1 diabetes as a child, get an exam upon reaching the age of  25 years or older and have had diabetes for 3 5 years. Yearly eye exams are recommended after that initial eye exam.  If you are diagnosed with type 1 diabetes as an adult, get an exam within 5 years of diagnosis and then yearly.  If you are diagnosed with type 2 diabetes, get an exam as soon as possible after the diagnosis and then yearly. Foot care exam  Visual foot exams are performed at every routine medical visit. The exams check for cuts, injuries, or other problems with the feet.  A comprehensive foot exam should be done yearly. This includes visual inspection as well as assessing foot pulses and testing for loss of sensation.  Check your feet nightly for cuts, injuries, or other problems with your feet. Tell your health care provider if anything is not healing. Kidney function test (urine microalbumin)  This test is performed once a year.  Type 1 diabetes: The first test is performed 5 years after diagnosis.  Type 2 diabetes: The first test is performed at the time of diagnosis.  A serum creatinine and estimated glomerular filtration rate (eGFR) test is done once a year to assess the level of chronic kidney disease (CKD), if present. Lipid profile (cholesterol, HDL, LDL, triglycerides)  Performed every 5 years for most people.  The goal for LDL is less than 100 mg/dL. If you are at high risk, the goal is less than 70 mg/dL.  The goal for HDL is 40 mg/dL 50 mg/dL for men and 50 mg/dL 60 mg/dL for women. An HDL cholesterol of 60 mg/dL or higher gives some protection against heart disease.  The goal for triglycerides is less than 150 mg/dL. Influenza vaccine, pneumococcal vaccine, and hepatitis B vaccine  The influenza vaccine is recommended yearly.  The pneumococcal vaccine is generally given once in a lifetime. However, there are some instances when another vaccination is recommended. Check with your health care provider.  The hepatitis B vaccine is also recommended for  adults with diabetes. Diabetes self-management education  Education is recommended at diagnosis and ongoing as needed. Treatment plan  Your treatment plan is reviewed at every medical visit. Document Released: 06/17/2009 Document Revised: 04/22/2013 Document Reviewed: 01/20/2013 [facility] Patient Information 2014 [place].

## 2013-09-22 NOTE — Progress Notes (Signed)
Pre-visit discussion using our clinic review tool. No additional management support is needed unless otherwise documented below in the visit note.  

## 2013-09-23 MED ORDER — INSULIN DETEMIR 100 UNIT/ML FLEXPEN: 80.0000 [IU] | PEN_INJECTOR | Freq: Two times a day (BID) | SUBCUTANEOUS | Status: DC

## 2013-09-23 NOTE — Addendum Note (Signed)
Addended by: Gwendolyn Grant A on: 09/23/2013 06:40 PM   Modules accepted: Orders

## 2013-10-15 ENCOUNTER — Other Ambulatory Visit: Payer: Medicare Other

## 2013-10-15 DIAGNOSIS — B20 Human immunodeficiency virus [HIV] disease: Secondary | ICD-10-CM

## 2013-10-16 LAB — HIV-1 RNA QUANT-NO REFLEX-BLD
HIV 1 RNA Quant: <20 copies/mL (ref <20)
HIV-1 RNA Quant, Log: <1.3 {Log} (ref <1.30)

## 2013-10-16 LAB — T-HELPER CELL (CD4) - (RCID CLINIC ONLY)
CD4 % Helper T Cell: 20 % — ABNORMAL LOW (ref 33–55)
CD4 T Cell Abs: 440 /uL (ref 400–2700)

## 2013-10-23 ENCOUNTER — Other Ambulatory Visit: Payer: Self-pay | Admitting: Internal Medicine

## 2013-11-05 ENCOUNTER — Ambulatory Visit: Payer: Medicare Other | Admitting: Internal Medicine

## 2013-11-10 ENCOUNTER — Other Ambulatory Visit: Payer: Self-pay | Admitting: Internal Medicine

## 2013-11-11 ENCOUNTER — Other Ambulatory Visit: Payer: Self-pay | Admitting: Endocrinology

## 2013-11-28 ENCOUNTER — Other Ambulatory Visit: Payer: Self-pay | Admitting: Endocrinology

## 2013-11-29 ENCOUNTER — Encounter (HOSPITAL_COMMUNITY): Payer: Self-pay | Admitting: Emergency Medicine

## 2013-11-29 ENCOUNTER — Emergency Department (HOSPITAL_COMMUNITY)
Admission: EM | Admit: 2013-11-29 | Discharge: 2013-11-29 | Disposition: A | Discharge status: Home / Self Care | Payer: Medicare Other | Attending: Emergency Medicine | Admitting: Emergency Medicine

## 2013-11-29 DIAGNOSIS — E7800 Pure hypercholesterolemia, unspecified: Secondary | ICD-10-CM | POA: Insufficient documentation

## 2013-11-29 DIAGNOSIS — E119 Type 2 diabetes mellitus without complications: Secondary | ICD-10-CM | POA: Insufficient documentation

## 2013-11-29 DIAGNOSIS — Z21 Asymptomatic human immunodeficiency virus [HIV] infection status: Secondary | ICD-10-CM | POA: Insufficient documentation

## 2013-11-29 DIAGNOSIS — F17200 Nicotine dependence, unspecified, uncomplicated: Secondary | ICD-10-CM | POA: Insufficient documentation

## 2013-11-29 DIAGNOSIS — R739 Hyperglycemia, unspecified: Secondary | ICD-10-CM

## 2013-11-29 DIAGNOSIS — I10 Essential (primary) hypertension: Secondary | ICD-10-CM | POA: Insufficient documentation

## 2013-11-29 DIAGNOSIS — B356 Tinea cruris: Secondary | ICD-10-CM | POA: Insufficient documentation

## 2013-11-29 DIAGNOSIS — B373 Candidiasis of vulva and vagina: Secondary | ICD-10-CM | POA: Insufficient documentation

## 2013-11-29 DIAGNOSIS — Z79899 Other long term (current) drug therapy: Secondary | ICD-10-CM | POA: Insufficient documentation

## 2013-11-29 DIAGNOSIS — M1710 Unilateral primary osteoarthritis, unspecified knee: Secondary | ICD-10-CM | POA: Insufficient documentation

## 2013-11-29 DIAGNOSIS — Z794 Long term (current) use of insulin: Secondary | ICD-10-CM | POA: Insufficient documentation

## 2013-11-29 DIAGNOSIS — J45909 Unspecified asthma, uncomplicated: Secondary | ICD-10-CM | POA: Insufficient documentation

## 2013-11-29 DIAGNOSIS — F3289 Other specified depressive episodes: Secondary | ICD-10-CM | POA: Insufficient documentation

## 2013-11-29 DIAGNOSIS — IMO0002 Reserved for concepts with insufficient information to code with codable children: Secondary | ICD-10-CM

## 2013-11-29 DIAGNOSIS — K219 Gastro-esophageal reflux disease without esophagitis: Secondary | ICD-10-CM | POA: Insufficient documentation

## 2013-11-29 DIAGNOSIS — Z7982 Long term (current) use of aspirin: Secondary | ICD-10-CM | POA: Insufficient documentation

## 2013-11-29 DIAGNOSIS — Z87828 Personal history of other (healed) physical injury and trauma: Secondary | ICD-10-CM | POA: Insufficient documentation

## 2013-11-29 DIAGNOSIS — B3731 Acute candidiasis of vulva and vagina: Secondary | ICD-10-CM | POA: Insufficient documentation

## 2013-11-29 DIAGNOSIS — G40909 Epilepsy, unspecified, not intractable, without status epilepticus: Secondary | ICD-10-CM | POA: Insufficient documentation

## 2013-11-29 DIAGNOSIS — Z3202 Encounter for pregnancy test, result negative: Secondary | ICD-10-CM | POA: Insufficient documentation

## 2013-11-29 DIAGNOSIS — F329 Major depressive disorder, single episode, unspecified: Secondary | ICD-10-CM | POA: Insufficient documentation

## 2013-11-29 DIAGNOSIS — Z8673 Personal history of transient ischemic attack (TIA), and cerebral infarction without residual deficits: Secondary | ICD-10-CM | POA: Insufficient documentation

## 2013-11-29 DIAGNOSIS — G43909 Migraine, unspecified, not intractable, without status migrainosus: Secondary | ICD-10-CM | POA: Insufficient documentation

## 2013-11-29 LAB — CBC WITH DIFFERENTIAL/PLATELET
Basophils Absolute: 0 10*3/uL (ref 0.0–0.1)
Basophils Relative: 0 % (ref 0–1)
EOS ABS: 0 10*3/uL (ref 0.0–0.7)
EOS PCT: 0 % (ref 0–5)
HEMATOCRIT: 40.3 % (ref 36.0–46.0)
Hemoglobin: 14.6 g/dL (ref 12.0–15.0)
LYMPHS PCT: 17 % (ref 12–46)
Lymphs Abs: 2 10*3/uL (ref 0.7–4.0)
MCH: 31.8 pg (ref 26.0–34.0)
MCHC: 36.2 g/dL — ABNORMAL HIGH (ref 30.0–36.0)
MCV: 87.8 fL (ref 78.0–100.0)
MONO ABS: 0.8 10*3/uL (ref 0.1–1.0)
Monocytes Relative: 7 % (ref 3–12)
Neutro Abs: 8.8 10*3/uL — ABNORMAL HIGH (ref 1.7–7.7)
Neutrophils Relative %: 76 % (ref 43–77)
PLATELETS: 209 10*3/uL (ref 150–400)
RBC: 4.59 MIL/uL (ref 3.87–5.11)
RDW: 13 % (ref 11.5–15.5)
WBC: 11.6 10*3/uL — AB (ref 4.0–10.5)

## 2013-11-29 LAB — COMPREHENSIVE METABOLIC PANEL
ALT: 22 U/L (ref 0–35)
AST: 21 U/L (ref 0–37)
Albumin: 3.5 g/dL (ref 3.5–5.2)
Alkaline Phosphatase: 139 U/L — ABNORMAL HIGH (ref 39–117)
BUN: 13 mg/dL (ref 6–23)
CALCIUM: 9.3 mg/dL (ref 8.4–10.5)
CO2: 22 meq/L (ref 19–32)
Chloride: 97 mEq/L (ref 96–112)
Creatinine, Ser: 0.62 mg/dL (ref 0.50–1.10)
GLUCOSE: 472 mg/dL — AB (ref 70–99)
Potassium: 4.3 mEq/L (ref 3.7–5.3)
Sodium: 137 mEq/L (ref 137–147)
Total Bilirubin: 0.3 mg/dL (ref 0.3–1.2)
Total Protein: 7 g/dL (ref 6.0–8.3)

## 2013-11-29 LAB — URINALYSIS, ROUTINE W REFLEX MICROSCOPIC
Bilirubin Urine: NEGATIVE
Glucose, UA: >1000 mg/dL — AB
Hgb urine dipstick: NEGATIVE
KETONES UR: NEGATIVE mg/dL
LEUKOCYTES UA: NEGATIVE
NITRITE: NEGATIVE
PROTEIN: NEGATIVE mg/dL
Specific Gravity, Urine: >1.046 — ABNORMAL HIGH (ref 1.005–1.030)
Urobilinogen, UA: 0.2 mg/dL (ref 0.0–1.0)
pH: 5 (ref 5.0–8.0)

## 2013-11-29 LAB — WET PREP, GENITAL
Trich, Wet Prep: NONE SEEN
Yeast Wet Prep HPF POC: NONE SEEN

## 2013-11-29 LAB — LIPASE, BLOOD: Lipase: 33 U/L (ref 11–59)

## 2013-11-29 LAB — CBG MONITORING, ED: GLUCOSE-CAPILLARY: 382 mg/dL — AB (ref 70–99)

## 2013-11-29 LAB — URINE MICROSCOPIC-ADD ON

## 2013-11-29 LAB — PREGNANCY, URINE: PREG TEST UR: NEGATIVE

## 2013-11-29 MED ORDER — FLUCONAZOLE 150 MG PO TABS: 150.0000 mg | ORAL_TABLET | Freq: Every day | ORAL | Status: AC

## 2013-11-29 MED ORDER — CLOTRIMAZOLE 1 % EX CREA: 1.0000 "application " | TOPICAL_CREAM | Freq: Two times a day (BID) | CUTANEOUS | Status: DC

## 2013-11-29 MED ORDER — FLUCONAZOLE 100 MG PO TABS
150.0000 mg | ORAL_TABLET | Freq: Once | ORAL | Status: AC
  Administered 2013-11-29: 150 mg via ORAL
  Filled 2013-11-29: qty 2

## 2013-11-29 MED ORDER — OXYCODONE-ACETAMINOPHEN 5-325 MG PO TABS
1.0000 | ORAL_TABLET | Freq: Once | ORAL | Status: AC
  Administered 2013-11-29: 1 via ORAL
  Filled 2013-11-29: qty 1

## 2013-11-29 MED ORDER — SODIUM CHLORIDE 0.9 % IV BOLUS (SEPSIS): 1000.0000 mL | Freq: Once | INTRAVENOUS | Status: DC

## 2013-11-29 MED ORDER — INSULIN ASPART 100 UNIT/ML ~~LOC~~ SOLN
16.0000 [IU] | Freq: Once | SUBCUTANEOUS | Status: AC
  Administered 2013-11-29: 16 [IU] via SUBCUTANEOUS
  Filled 2013-11-29: qty 1

## 2013-11-29 NOTE — Discharge Instructions (Signed)
Take diflucan two more doses, once a day. Apply clotrimazole cream twice a day to the external area, use for 2 weeks or until resolved. Make sure to keep your blood sugar under control, because that is what is causing your yeast infection. Keep area dry, you can use baby powder. Follow up with primary care doctor.     Candidal Vulvovaginitis Candidal vulvovaginitis is an infection of the vagina and vulva. The vulva is the skin around the opening of the vagina. This may cause itching and discomfort in and around the vagina.  HOME CARE  Only take medicine as told by your doctor.  Do not have sex (intercourse) until the infection is healed or as told by your doctor.  Practice safe sex.  Tell your sex partner about your infection.  Do not douche or use tampons.  Wear cotton underwear. Do not wear tight pants or panty hose.  Eat yogurt. This may help treat and prevent yeast infections. GET HELP RIGHT AWAY IF:   You have a fever.  Your problems get worse during treatment or do not get better in 3 days.  You have discomfort, irritation, or itching in your vagina or vulva area.  You have pain after sex.  You start to get belly (abdominal) pain. MAKE SURE YOU:  Understand these instructions.  Will watch your condition.  Will get help right away if you are not doing well or get worse. Document Released: 11/16/2008 Document Revised: 11/12/2011 Document Reviewed: 11/16/2008 [facility] Patient Information 2014 [place], [place].

## 2013-11-29 NOTE — ED Notes (Signed)
The pt is c/o abd and vaginal pain for one week nausea no vomiting.  Frequent urination different color  Also  lmp last month.  She has a vaginal discharge and she reports that she has ayeast infection

## 2013-11-29 NOTE — ED Notes (Signed)
Patient is alert and orientedx4.  Patient was explained discharge instructions and they understood them with no questions.  The patient's son, Sylena Lotter is coming to take the patient home.
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
## 2013-11-29 NOTE — ED Provider Notes (Signed)
Antolin Belsito S 8:00PM pt discussed in sign out.  Pt with incidental hyperglycemia.  Asymptomatic.  Fluids and insulin ordered.  Will plan to re-check sugar for improvement and then d/c home.  8:30PM  Pt refused IV for fluids.  She is drinking well with no N/V.  Sugar now in 300's after insulin.  Pt continues to do well.  Will d/c home.  She will continue her home meds for DM and follow up with PCP.    Martie Lee, PA-C 11/29/13 2038

## 2013-11-30 LAB — GC/CHLAMYDIA PROBE AMP
CT Probe RNA: NEGATIVE
GC Probe RNA: NEGATIVE

## 2013-12-01 NOTE — ED Provider Notes (Signed)
Medical screening examination/treatment/procedure(s) were performed by non-physician practitioner and as supervising physician I was immediately available for consultation/collaboration.   EKG Interpretation None        Tanna Furry, MD 12/01/13 [phone]

## 2013-12-23 ENCOUNTER — Ambulatory Visit (INDEPENDENT_AMBULATORY_CARE_PROVIDER_SITE_OTHER): Payer: Medicare Other | Admitting: Internal Medicine

## 2013-12-23 ENCOUNTER — Encounter: Payer: Self-pay | Admitting: Internal Medicine

## 2013-12-23 VITALS — BP 175/98 | HR 82 | Temp 98.2°F | Ht 63.0 in | Wt 276.0 lb

## 2013-12-23 DIAGNOSIS — B20 Human immunodeficiency virus [HIV] disease: Secondary | ICD-10-CM

## 2013-12-23 DIAGNOSIS — J309 Allergic rhinitis, unspecified: Secondary | ICD-10-CM

## 2013-12-23 MED ORDER — TRAZODONE HCL 150 MG PO TABS: 150.0000 mg | ORAL_TABLET | Freq: Every day | ORAL | Status: DC

## 2013-12-23 MED ORDER — ELVITEG-COBIC-EMTRICIT-TENOFDF 150-150-200-300 MG PO TABS: 1.0000 | ORAL_TABLET | Freq: Every day | ORAL | Status: DC

## 2013-12-23 NOTE — Progress Notes (Signed)
  Subjective:    Patient ID: Susan [name], female    DOB: [birth year], 53 y.o.   MRN: [id number]  HPI  She comes in for followup of her HIV. She was changed last year to [place] do to her underlying psychiatric illnesses. She has noted a significant improvement in energy. No big change in anxiety or depression but she feels much better. She continues to lose weight and work on her exercise and weight loss for her diabetes. Is pleased with the new regimen.  Last visit was undetectable.  On Advair but does not take regularly.    Review of Systems  Constitutional: Negative for fatigue.  HENT: Negative for sore throat and trouble swallowing.   Gastrointestinal: Negative for diarrhea.  Musculoskeletal: Negative for myalgias.  Skin: Negative for rash.  Neurological: Negative for dizziness and headaches.       Objective:   Physical Exam  Constitutional: She appears well-developed and well-nourished. No distress.  HENT:  Mouth/Throat: Oropharynx is clear and moist. No oropharyngeal exudate.  Cardiovascular: Normal rate, regular rhythm and normal heart sounds.   No murmur heard. Pulmonary/Chest: Effort normal and breath sounds normal. No respiratory distress. She has no wheezes.  Lymphadenopathy:    She has no cervical adenopathy.          Assessment & Plan:

## 2013-12-23 NOTE — Assessment & Plan Note (Signed)
Does not use Advair much.  Interacts with Stribild.  COuld use Dulera if needed.

## 2013-12-23 NOTE — Assessment & Plan Note (Signed)
Doing great.  RTC 3 moths.  I refilled trazodone for her as a one time refill.  Gets from [place]
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
## 2013-12-30 ENCOUNTER — Ambulatory Visit: Payer: Medicare Other | Admitting: Endocrinology

## 2014-01-03 ENCOUNTER — Emergency Department (HOSPITAL_COMMUNITY)
Admission: EM | Admit: 2014-01-03 | Discharge: 2014-01-03 | Disposition: A | Discharge status: Home / Self Care | Payer: Medicare Other | Attending: Emergency Medicine | Admitting: Emergency Medicine

## 2014-01-03 ENCOUNTER — Other Ambulatory Visit: Payer: Self-pay | Admitting: Endocrinology

## 2014-01-03 ENCOUNTER — Encounter (HOSPITAL_COMMUNITY): Payer: Self-pay | Admitting: Emergency Medicine

## 2014-01-03 ENCOUNTER — Emergency Department (HOSPITAL_COMMUNITY): Payer: Medicare Other

## 2014-01-03 DIAGNOSIS — Z794 Long term (current) use of insulin: Secondary | ICD-10-CM | POA: Insufficient documentation

## 2014-01-03 DIAGNOSIS — IMO0002 Reserved for concepts with insufficient information to code with codable children: Secondary | ICD-10-CM | POA: Insufficient documentation

## 2014-01-03 DIAGNOSIS — R600 Localized edema: Secondary | ICD-10-CM

## 2014-01-03 DIAGNOSIS — E119 Type 2 diabetes mellitus without complications: Secondary | ICD-10-CM | POA: Insufficient documentation

## 2014-01-03 DIAGNOSIS — J45909 Unspecified asthma, uncomplicated: Secondary | ICD-10-CM | POA: Insufficient documentation

## 2014-01-03 DIAGNOSIS — E7800 Pure hypercholesterolemia, unspecified: Secondary | ICD-10-CM | POA: Insufficient documentation

## 2014-01-03 DIAGNOSIS — Z8673 Personal history of transient ischemic attack (TIA), and cerebral infarction without residual deficits: Secondary | ICD-10-CM | POA: Insufficient documentation

## 2014-01-03 DIAGNOSIS — Z87828 Personal history of other (healed) physical injury and trauma: Secondary | ICD-10-CM | POA: Insufficient documentation

## 2014-01-03 DIAGNOSIS — Z21 Asymptomatic human immunodeficiency virus [HIV] infection status: Secondary | ICD-10-CM | POA: Insufficient documentation

## 2014-01-03 DIAGNOSIS — F3289 Other specified depressive episodes: Secondary | ICD-10-CM | POA: Insufficient documentation

## 2014-01-03 DIAGNOSIS — Z7982 Long term (current) use of aspirin: Secondary | ICD-10-CM | POA: Insufficient documentation

## 2014-01-03 DIAGNOSIS — Z79899 Other long term (current) drug therapy: Secondary | ICD-10-CM | POA: Insufficient documentation

## 2014-01-03 DIAGNOSIS — F329 Major depressive disorder, single episode, unspecified: Secondary | ICD-10-CM | POA: Insufficient documentation

## 2014-01-03 DIAGNOSIS — K219 Gastro-esophageal reflux disease without esophagitis: Secondary | ICD-10-CM | POA: Insufficient documentation

## 2014-01-03 DIAGNOSIS — M1710 Unilateral primary osteoarthritis, unspecified knee: Secondary | ICD-10-CM | POA: Insufficient documentation

## 2014-01-03 DIAGNOSIS — F17200 Nicotine dependence, unspecified, uncomplicated: Secondary | ICD-10-CM | POA: Insufficient documentation

## 2014-01-03 DIAGNOSIS — G43909 Migraine, unspecified, not intractable, without status migrainosus: Secondary | ICD-10-CM | POA: Insufficient documentation

## 2014-01-03 DIAGNOSIS — I10 Essential (primary) hypertension: Secondary | ICD-10-CM | POA: Insufficient documentation

## 2014-01-03 DIAGNOSIS — M6289 Other specified disorders of muscle: Secondary | ICD-10-CM | POA: Insufficient documentation

## 2014-01-03 DIAGNOSIS — G40909 Epilepsy, unspecified, not intractable, without status epilepticus: Secondary | ICD-10-CM | POA: Insufficient documentation

## 2014-01-03 LAB — URINALYSIS, ROUTINE W REFLEX MICROSCOPIC
Bilirubin Urine: NEGATIVE
Glucose, UA: >1000 mg/dL — AB
Hgb urine dipstick: NEGATIVE
Ketones, ur: NEGATIVE mg/dL
Leukocytes, UA: NEGATIVE
Nitrite: NEGATIVE
Protein, ur: NEGATIVE mg/dL
Specific Gravity, Urine: 1.031 — ABNORMAL HIGH (ref 1.005–1.030)
Urobilinogen, UA: 0.2 mg/dL (ref 0.0–1.0)
pH: 5.5 (ref 5.0–8.0)

## 2014-01-03 LAB — CBC WITH DIFFERENTIAL/PLATELET
Basophils Absolute: 0 K/uL (ref 0.0–0.1)
Basophils Relative: 0 % (ref 0–1)
Eosinophils Absolute: 0.1 10*3/uL (ref 0.0–0.7)
Eosinophils Relative: 1 % (ref 0–5)
HCT: 41.1 % (ref 36.0–46.0)
Hemoglobin: 14.1 g/dL (ref 12.0–15.0)
Lymphocytes Relative: 26 % (ref 12–46)
Lymphs Abs: 2 10*3/uL (ref 0.7–4.0)
MCH: 30.6 pg (ref 26.0–34.0)
MCHC: 34.3 g/dL (ref 30.0–36.0)
MCV: 89.2 fL (ref 78.0–100.0)
Monocytes Absolute: 0.4 10*3/uL (ref 0.1–1.0)
Monocytes Relative: 5 % (ref 3–12)
Neutro Abs: 5.2 K/uL (ref 1.7–7.7)
Neutrophils Relative %: 68 % (ref 43–77)
Platelets: 251 10*3/uL (ref 150–400)
RBC: 4.61 MIL/uL (ref 3.87–5.11)
RDW: 14.1 % (ref 11.5–15.5)
WBC: 7.7 K/uL (ref 4.0–10.5)

## 2014-01-03 LAB — BASIC METABOLIC PANEL WITH GFR
CO2: 24 meq/L (ref 19–32)
Calcium: 8.9 mg/dL (ref 8.4–10.5)
GFR calc Af Amer: >90 mL/min (ref >90)
Sodium: 138 meq/L (ref 137–147)

## 2014-01-03 LAB — BASIC METABOLIC PANEL
BUN: 11 mg/dL (ref 6–23)
Chloride: 99 mEq/L (ref 96–112)
Creatinine, Ser: 0.65 mg/dL (ref 0.50–1.10)
GFR calc non Af Amer: >90 mL/min (ref >90)
Glucose, Bld: 345 mg/dL — ABNORMAL HIGH (ref 70–99)
Potassium: 4.4 mEq/L (ref 3.7–5.3)

## 2014-01-03 LAB — URINE MICROSCOPIC-ADD ON

## 2014-01-03 LAB — PRO B NATRIURETIC PEPTIDE: Pro B Natriuretic peptide (BNP): 37.1 pg/mL (ref 0–125)

## 2014-01-03 IMAGING — CR DG CHEST 2V
2 series · 2 of 2 positions shown · non-contrast
Comparison: [DATE].

CLINICAL DATA: Shortness of breath.

EXAM:
CHEST  2 VIEW

[w chest pa]
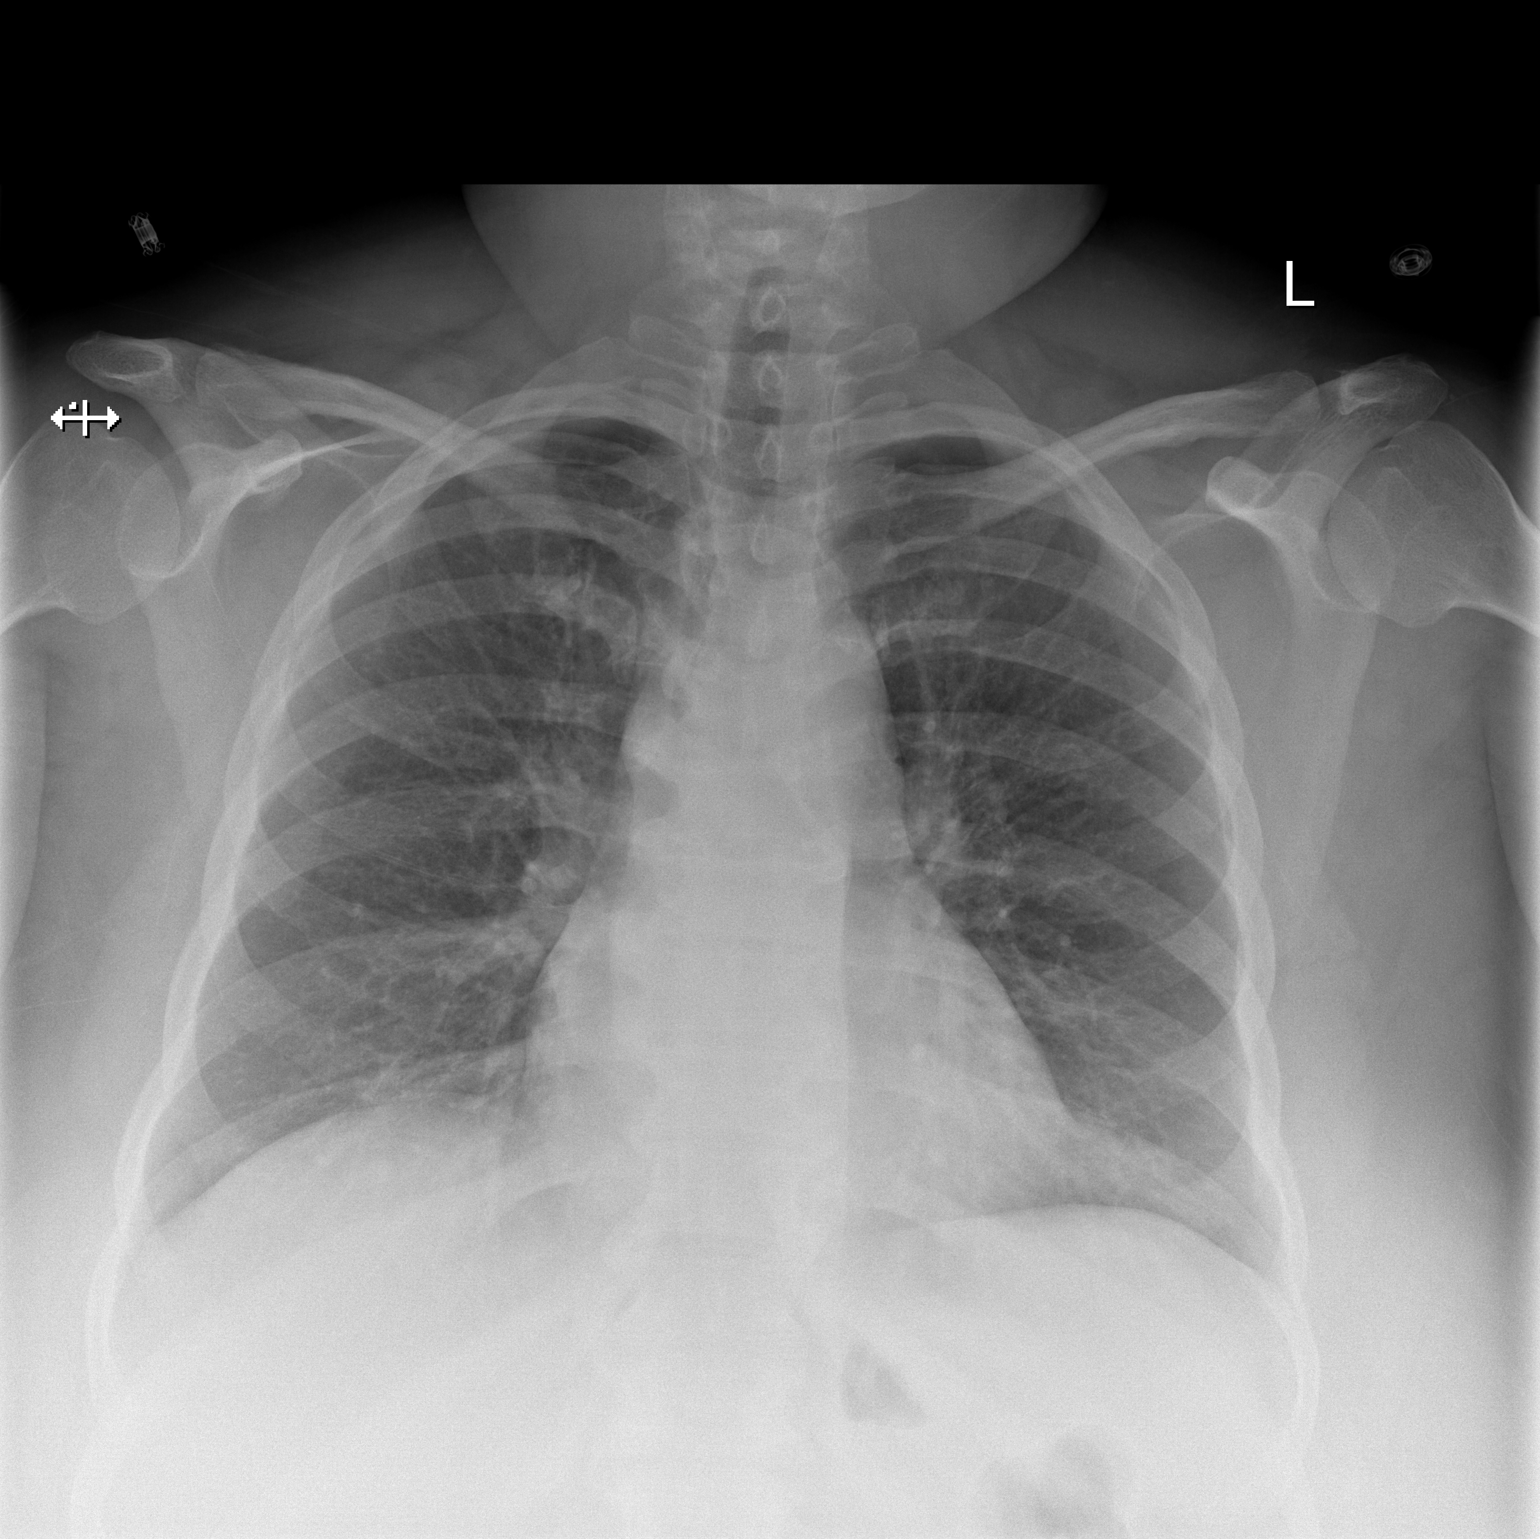

[w chest lat]
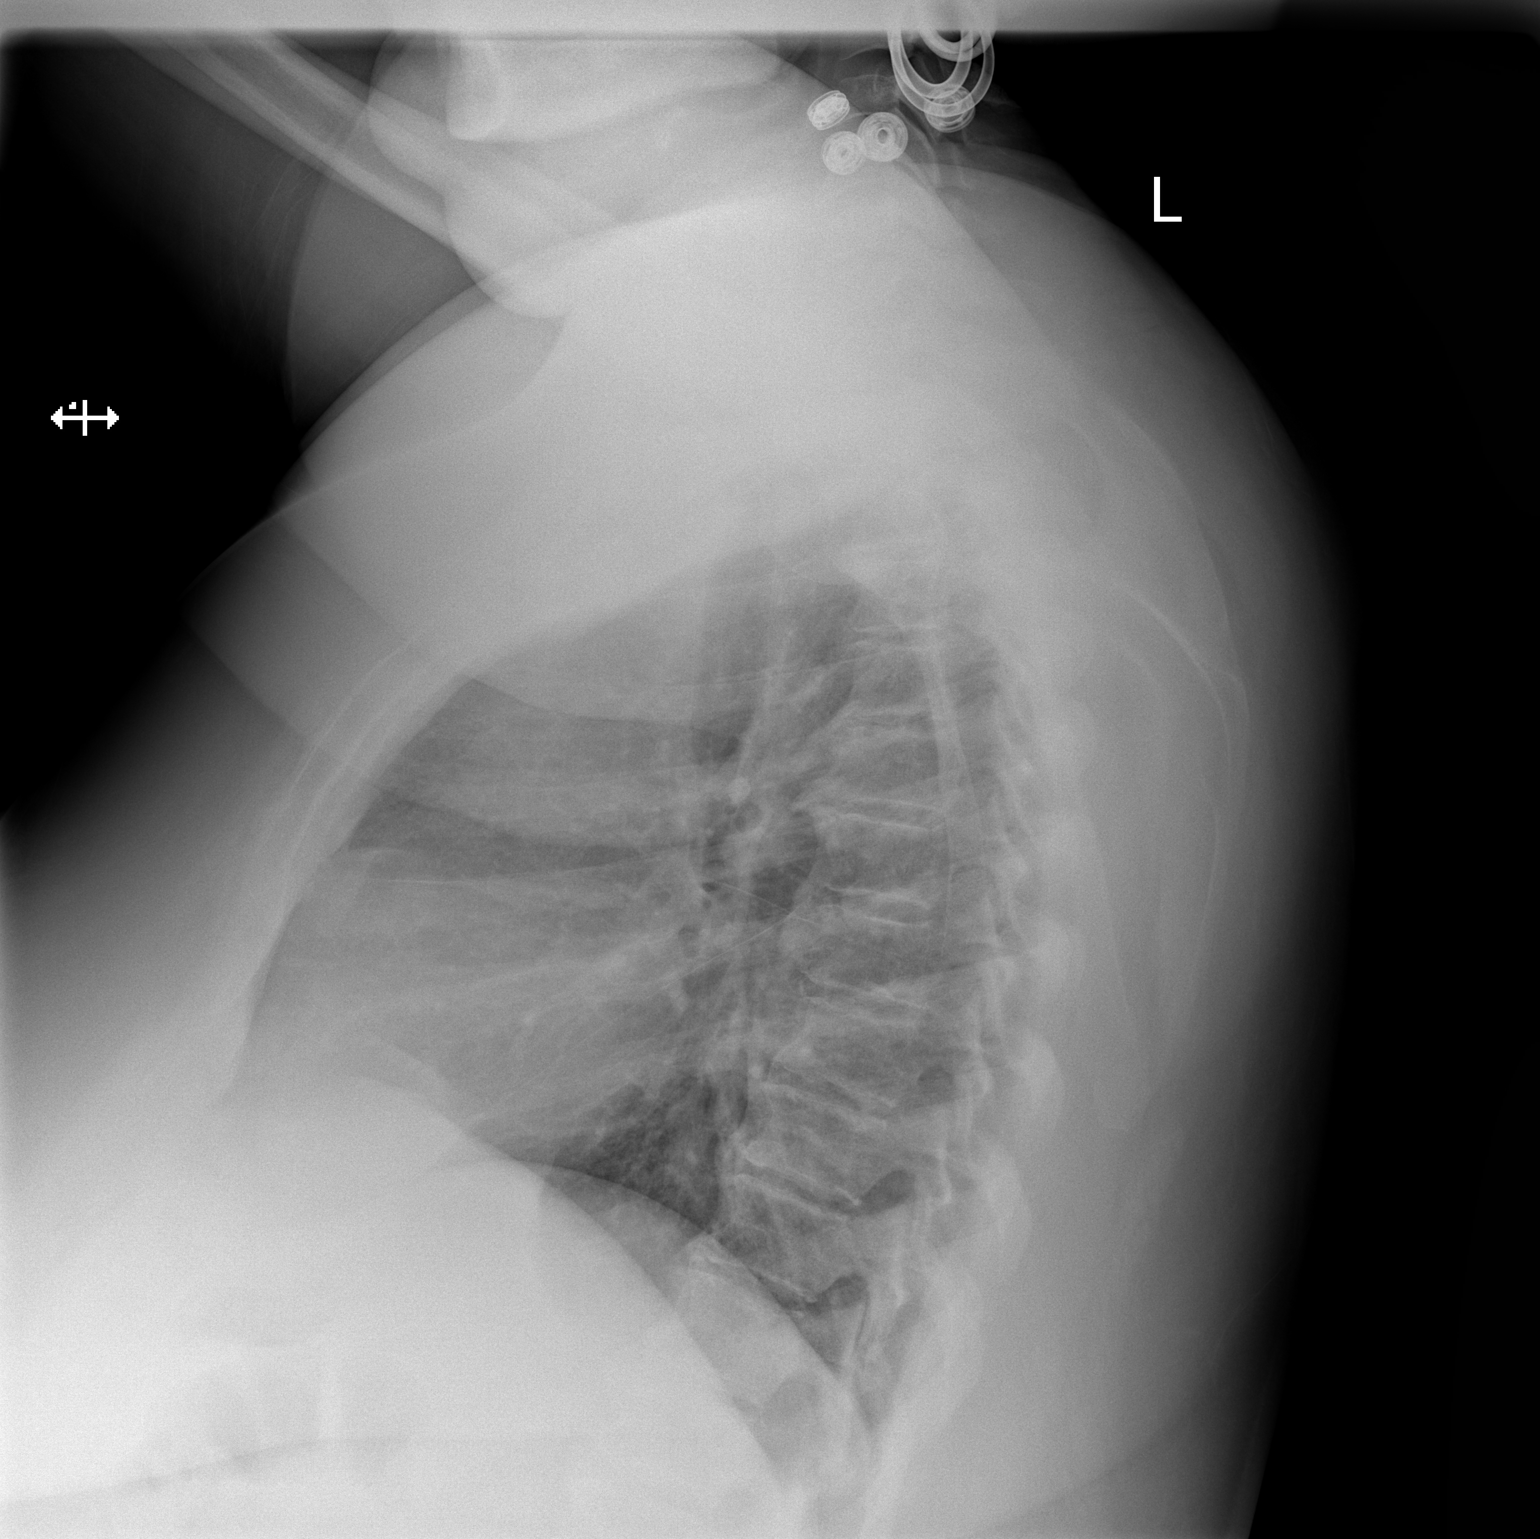

[2 of 2 positions shown; findings below may reference images not displayed]

FINDINGS: The heart size and mediastinal contours are within normal limits.
Both lungs are clear. The visualized skeletal structures are
unremarkable.
IMPRESSION: No evidence for CHF.

## 2014-01-03 MED ORDER — HYDROCODONE-ACETAMINOPHEN 5-325 MG PO TABS
1.0000 | ORAL_TABLET | Freq: Once | ORAL | Status: AC
  Administered 2014-01-03: 1 via ORAL
  Filled 2014-01-03: qty 1

## 2014-01-03 MED ORDER — FUROSEMIDE 20 MG PO TABS: 20.0000 mg | ORAL_TABLET | Freq: Every day | ORAL | Status: DC
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
## 2014-01-03 NOTE — ED Notes (Signed)
Pt reports bilateral leg swelling x 3 days. Pain up both legs into her lower abdomen which feels crampy. Pt can bear wt. Is ambulatory. Is diabetic.

## 2014-01-03 NOTE — Discharge Instructions (Signed)
Return here as needed.  Followup with your primary care doctor as soon as possible

## 2014-01-07 ENCOUNTER — Ambulatory Visit (INDEPENDENT_AMBULATORY_CARE_PROVIDER_SITE_OTHER): Payer: Medicare Other | Admitting: Endocrinology

## 2014-01-07 ENCOUNTER — Encounter: Payer: Self-pay | Admitting: Endocrinology

## 2014-01-07 VITALS — BP 126/80 | HR 60 | Temp 98.4°F | Ht 63.0 in | Wt 283.0 lb

## 2014-01-07 DIAGNOSIS — E1149 Type 2 diabetes mellitus with other diabetic neurological complication: Secondary | ICD-10-CM

## 2014-01-07 MED ORDER — INSULIN ISOPHANE HUMAN 100 UNIT/ML KWIKPEN: 100.0000 [IU] | PEN_INJECTOR | SUBCUTANEOUS | Status: DC
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
## 2014-01-07 NOTE — ED Provider Notes (Signed)
Medical screening examination/treatment/procedure(s) were performed by non-physician practitioner and as supervising physician I was immediately available for consultation/collaboration.  Saddie Benders. Dorna Mai, MD 01/07/14 2303

## 2014-01-07 NOTE — Patient Instructions (Addendum)
i have sent a prescription to your pharmacy, to again change the insulin to one that works more later in the day, and less in the early morning.   Because it is not a unit-to unit conversion, we have to guess at an amount.  It is probably not just right.  Please call if your blood sugar is off.   Please come back for a follow-up appointment in 2 weeks. check your blood sugar twice a day.  vary the time of day when you check, between before the 3 meals, and at bedtime.  also check if you have symptoms of your blood sugar being too high or too low.  please keep a record of the readings and bring it to your next appointment here.  please call us sooner if your blood sugar goes below 70, or if you have a lot of readings over 200.

## 2014-01-08 ENCOUNTER — Other Ambulatory Visit: Payer: Medicare Other

## 2014-01-20 ENCOUNTER — Ambulatory Visit: Payer: Medicare Other | Admitting: Internal Medicine

## 2014-01-21 ENCOUNTER — Encounter: Payer: Self-pay | Admitting: Endocrinology

## 2014-01-21 ENCOUNTER — Ambulatory Visit (INDEPENDENT_AMBULATORY_CARE_PROVIDER_SITE_OTHER): Payer: Medicare Other | Admitting: Endocrinology

## 2014-01-21 VITALS — BP 122/84 | HR 80 | Temp 98.3°F | Ht 63.0 in | Wt 283.0 lb

## 2014-01-21 DIAGNOSIS — E1149 Type 2 diabetes mellitus with other diabetic neurological complication: Secondary | ICD-10-CM

## 2014-01-21 NOTE — Patient Instructions (Addendum)
Please continue the same insulin.   Please come back for a follow-up appointment in 2 months. check your blood sugar twice a day.  vary the time of day when you check, between before the 3 meals, and at bedtime.  also check if you have symptoms of your blood sugar being too high or too low.  please keep a record of the readings and bring it to your next appointment here.  please call us sooner if your blood sugar goes below 70, or if you have a lot of readings over 200.
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
## 2014-01-26 ENCOUNTER — Other Ambulatory Visit (INDEPENDENT_AMBULATORY_CARE_PROVIDER_SITE_OTHER): Payer: Medicare Other

## 2014-01-26 ENCOUNTER — Other Ambulatory Visit: Payer: Self-pay | Admitting: Internal Medicine

## 2014-01-26 ENCOUNTER — Encounter: Payer: Self-pay | Admitting: Internal Medicine

## 2014-01-26 ENCOUNTER — Ambulatory Visit (INDEPENDENT_AMBULATORY_CARE_PROVIDER_SITE_OTHER): Payer: Medicare Other | Admitting: Internal Medicine

## 2014-01-26 VITALS — BP 132/90 | HR 64 | Temp 98.4°F | Wt 279.8 lb

## 2014-01-26 DIAGNOSIS — E1149 Type 2 diabetes mellitus with other diabetic neurological complication: Secondary | ICD-10-CM

## 2014-01-26 DIAGNOSIS — B20 Human immunodeficiency virus [HIV] disease: Secondary | ICD-10-CM

## 2014-01-26 DIAGNOSIS — E785 Hyperlipidemia, unspecified: Secondary | ICD-10-CM

## 2014-01-26 LAB — HEMOGLOBIN A1C: HEMOGLOBIN A1C: 12.7 % — AB (ref 4.6–6.5)

## 2014-01-26 LAB — MICROALBUMIN / CREATININE URINE RATIO
Creatinine,U: 47.1 mg/dL
Microalb Creat Ratio: 0.2 mg/g (ref 0.0–30.0)
Microalb, Ur: 0.1 mg/dL (ref 0.0–1.9)

## 2014-01-26 MED ORDER — CLONIDINE HCL 0.2 MG PO TABS: 0.2000 mg | ORAL_TABLET | Freq: Two times a day (BID) | ORAL | Status: DC

## 2014-01-26 MED ORDER — AMLODIPINE BESYLATE 5 MG PO TABS: 5.0000 mg | ORAL_TABLET | Freq: Every day | ORAL | Status: DC

## 2014-01-26 MED ORDER — FLUTICASONE-SALMETEROL 230-21 MCG/ACT IN AERO: 2.0000 | INHALATION_SPRAY | Freq: Every day | RESPIRATORY_TRACT | Status: DC | PRN

## 2014-01-26 MED ORDER — FUROSEMIDE 20 MG PO TABS: 20.0000 mg | ORAL_TABLET | Freq: Every day | ORAL | Status: DC

## 2014-01-26 MED ORDER — ATENOLOL 25 MG PO TABS: 25.0000 mg | ORAL_TABLET | Freq: Every day | ORAL | Status: DC

## 2014-01-26 MED ORDER — ENALAPRIL MALEATE 20 MG PO TABS: 20.0000 mg | ORAL_TABLET | Freq: Every day | ORAL | Status: DC

## 2014-01-26 MED ORDER — OMEPRAZOLE 20 MG PO CPDR: 20.0000 mg | DELAYED_RELEASE_CAPSULE | Freq: Every day | ORAL | Status: DC

## 2014-01-26 MED ORDER — SIMVASTATIN 40 MG PO TABS: 40.0000 mg | ORAL_TABLET | Freq: Every day | ORAL | Status: DC

## 2014-01-26 MED ORDER — CITALOPRAM HYDROBROMIDE 20 MG PO TABS: 20.0000 mg | ORAL_TABLET | Freq: Every day | ORAL | Status: DC
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
## 2014-01-26 NOTE — Assessment & Plan Note (Signed)
On simva -dose increased from 20 to 40 qd 10/2012 Check lipids annually and titrate as needed

## 2014-01-26 NOTE — Assessment & Plan Note (Signed)
Dx 2006 - On tx since 2010 and follows with ID [id number] for same - undetectable VL;  Change in anti-viral regimen October 2014 reviewed, remains undetectable The current medical regimen is effective;  continue present plan and medications.

## 2014-01-26 NOTE — Patient Instructions (Addendum)
It was good to see you today.  We have reviewed your prior records including labs and tests today  Test(s) ordered today. Your results will be released to [place] (or called to you) after review, usually within 72hours after test completion. If any changes need to be made, you will be notified at that same time.  Medications reviewed and updated, no changes recommended at this time. Refill on medication(s) as discussed today.  Continue to think about giving up cigarettes! Use nicotine gum, nicotine patches or electronic cigarettes. If you're interested in medication to help you quit, please call  - let me know how I can help!   Please schedule followup in 6 months, call sooner if problems. Diabetes and Exercise Exercising regularly is important. It is not just about losing weight. It has many health benefits, such as:  Improving your overall fitness, flexibility, and endurance.  Increasing your bone density.  Helping with weight control.  Decreasing your body fat.  Increasing your muscle strength.  Reducing stress and tension.  Improving your overall health. People with diabetes who exercise gain additional benefits because exercise:  Reduces appetite.  Improves the body's use of blood sugar (glucose).  Helps lower or control blood glucose.  Decreases blood pressure.  Helps control blood lipids (such as cholesterol and triglycerides).  Improves the body's use of the hormone insulin by:  Increasing the body's insulin sensitivity.  Reducing the body's insulin needs.  Decreases the risk for heart disease because exercising:  Lowers cholesterol and triglycerides levels.  Increases the levels of good cholesterol (such as high-density lipoproteins [HDL]) in the body.  Lowers blood glucose levels. YOUR ACTIVITY PLAN  Choose an activity that you enjoy and set realistic goals. Your health care provider or diabetes educator can help you make an activity plan that works  for you. You can break activities into 2 or 3 sessions throughout the day. Doing so is as good as one long session. Exercise ideas include:  Taking the dog for a walk.  Taking the stairs instead of the elevator.  Dancing to your favorite song.  Doing your favorite exercise with a friend. RECOMMENDATIONS FOR EXERCISING WITH TYPE 1 OR TYPE 2 DIABETES   Check your blood glucose before exercising. If blood glucose levels are greater than 240 mg/dL, check for urine ketones. Do not exercise if ketones are present.  Avoid injecting insulin into areas of the body that are going to be exercised. For example, avoid injecting insulin into:  The arms when playing tennis.  The legs when jogging.  Keep a record of:  Food intake before and after you exercise.  Expected peak times of insulin action.  Blood glucose levels before and after you exercise.  The type and amount of exercise you have done.  Review your records with your health care provider. Your health care provider will help you to develop guidelines for adjusting food intake and insulin amounts before and after exercising.  If you take insulin or oral hypoglycemic agents, watch for signs and symptoms of hypoglycemia. They include:  Dizziness.  Shaking.  Sweating.  Chills.  Confusion.  Drink plenty of water while you exercise to prevent dehydration or heat stroke. Body water is lost during exercise and must be replaced.  Talk to your health care provider before starting an exercise program to make sure it is safe for you. Remember, almost any type of activity is better than none. Document Released: 11/10/2003 Document Revised: 04/22/2013 Document Reviewed: 01/27/2013 Exit[employer] Patient Information  2014 [place], [place].

## 2014-01-26 NOTE — Assessment & Plan Note (Signed)
Wt Readings from Last 3 Encounters:  01/26/14 279 lb 12.8 oz (126.916 kg)  01/21/14 283 lb (128.368 kg)  01/07/14 283 lb (128.368 kg)   The patient is asked to make an attempt to improve diet and exercise patterns to aid in medical management of this problem.

## 2014-01-26 NOTE — Assessment & Plan Note (Signed)
Dx 10/2010 in [place] during hospitalization for [place] -  on insulin +met until 01/2011 - Now working with endo: hx amaryl, januvia, metformin> changed to Lantus 07/2012, then NPH 01/2014 - reports improved home cbgs Encouraged efforts at weight loss as ongoing with diet and exercise Check a1c q 3-58moOn ACE, added statin and ASA 04/2011, esp with TIA hx 2010 Lab Results  Component Value Date   HGBA1C 12.3* 09/22/2013

## 2014-01-26 NOTE — Progress Notes (Signed)
Pre visit review using our clinic review tool, if applicable. No additional management support is needed unless otherwise documented below in the visit note. 

## 2014-02-11 ENCOUNTER — Other Ambulatory Visit: Payer: Self-pay | Admitting: Endocrinology

## 2014-02-11 NOTE — Telephone Encounter (Signed)
Please advise if ok to refill. Med not on current med list.  Thanks!

## 2014-03-21 ENCOUNTER — Other Ambulatory Visit: Payer: Self-pay | Admitting: Internal Medicine

## 2014-03-23 ENCOUNTER — Ambulatory Visit: Payer: Medicare Other | Admitting: Endocrinology

## 2014-03-24 ENCOUNTER — Other Ambulatory Visit: Payer: Medicare Other

## 2014-04-08 ENCOUNTER — Ambulatory Visit: Payer: Medicare Other | Admitting: Internal Medicine

## 2014-04-28 ENCOUNTER — Other Ambulatory Visit: Payer: Medicare Other

## 2014-05-01 ENCOUNTER — Encounter (HOSPITAL_COMMUNITY): Payer: Self-pay | Admitting: Emergency Medicine

## 2014-05-01 ENCOUNTER — Emergency Department (HOSPITAL_COMMUNITY)
Admission: EM | Admit: 2014-05-01 | Discharge: 2014-05-02 | Disposition: A | Discharge status: Other Acute Inpatient Hospital | Payer: Medicare Other | Attending: Dermatology | Admitting: Dermatology

## 2014-05-01 DIAGNOSIS — F1210 Cannabis abuse, uncomplicated: Secondary | ICD-10-CM | POA: Insufficient documentation

## 2014-05-01 DIAGNOSIS — IMO0002 Reserved for concepts with insufficient information to code with codable children: Secondary | ICD-10-CM | POA: Diagnosis not present

## 2014-05-01 DIAGNOSIS — F17200 Nicotine dependence, unspecified, uncomplicated: Secondary | ICD-10-CM | POA: Insufficient documentation

## 2014-05-01 DIAGNOSIS — J45909 Unspecified asthma, uncomplicated: Secondary | ICD-10-CM | POA: Diagnosis not present

## 2014-05-01 DIAGNOSIS — Z21 Asymptomatic human immunodeficiency virus [HIV] infection status: Secondary | ICD-10-CM | POA: Insufficient documentation

## 2014-05-01 DIAGNOSIS — F332 Major depressive disorder, recurrent severe without psychotic features: Secondary | ICD-10-CM | POA: Insufficient documentation

## 2014-05-01 DIAGNOSIS — Z79899 Other long term (current) drug therapy: Secondary | ICD-10-CM | POA: Diagnosis not present

## 2014-05-01 DIAGNOSIS — M129 Arthropathy, unspecified: Secondary | ICD-10-CM | POA: Insufficient documentation

## 2014-05-01 DIAGNOSIS — Z046 Encounter for general psychiatric examination, requested by authority: Secondary | ICD-10-CM | POA: Insufficient documentation

## 2014-05-01 DIAGNOSIS — Z7982 Long term (current) use of aspirin: Secondary | ICD-10-CM | POA: Insufficient documentation

## 2014-05-01 DIAGNOSIS — R45851 Suicidal ideations: Secondary | ICD-10-CM | POA: Insufficient documentation

## 2014-05-01 DIAGNOSIS — Z8673 Personal history of transient ischemic attack (TIA), and cerebral infarction without residual deficits: Secondary | ICD-10-CM | POA: Insufficient documentation

## 2014-05-01 DIAGNOSIS — F3289 Other specified depressive episodes: Secondary | ICD-10-CM | POA: Diagnosis not present

## 2014-05-01 DIAGNOSIS — G4700 Insomnia, unspecified: Secondary | ICD-10-CM | POA: Insufficient documentation

## 2014-05-01 DIAGNOSIS — Z87828 Personal history of other (healed) physical injury and trauma: Secondary | ICD-10-CM | POA: Insufficient documentation

## 2014-05-01 DIAGNOSIS — Z794 Long term (current) use of insulin: Secondary | ICD-10-CM | POA: Insufficient documentation

## 2014-05-01 DIAGNOSIS — F329 Major depressive disorder, single episode, unspecified: Secondary | ICD-10-CM | POA: Insufficient documentation

## 2014-05-01 DIAGNOSIS — I10 Essential (primary) hypertension: Secondary | ICD-10-CM | POA: Diagnosis not present

## 2014-05-01 DIAGNOSIS — Z8669 Personal history of other diseases of the nervous system and sense organs: Secondary | ICD-10-CM | POA: Insufficient documentation

## 2014-05-01 DIAGNOSIS — E119 Type 2 diabetes mellitus without complications: Secondary | ICD-10-CM | POA: Diagnosis not present

## 2014-05-01 LAB — COMPREHENSIVE METABOLIC PANEL
ALT: 12 U/L (ref 0–35)
AST: 13 U/L (ref 0–37)
Albumin: 3.4 g/dL — ABNORMAL LOW (ref 3.5–5.2)
Alkaline Phosphatase: 128 U/L — ABNORMAL HIGH (ref 39–117)
Anion gap: 20 — ABNORMAL HIGH (ref 5–15)
BUN: 8 mg/dL (ref 6–23)
CHLORIDE: 98 meq/L (ref 96–112)
CO2: 20 mEq/L (ref 19–32)
CREATININE: 0.66 mg/dL (ref 0.50–1.10)
Calcium: 9.1 mg/dL (ref 8.4–10.5)
GLUCOSE: 587 mg/dL — AB (ref 70–99)
Potassium: 3.5 mEq/L — ABNORMAL LOW (ref 3.7–5.3)
Sodium: 138 mEq/L (ref 137–147)
Total Bilirubin: <0.2 mg/dL — ABNORMAL LOW (ref 0.3–1.2)
Total Protein: 6.9 g/dL (ref 6.0–8.3)

## 2014-05-01 LAB — CBC
HEMATOCRIT: 46.1 % — AB (ref 36.0–46.0)
Hemoglobin: 16.2 g/dL — ABNORMAL HIGH (ref 12.0–15.0)
MCH: 31 pg (ref 26.0–34.0)
MCHC: 35.1 g/dL (ref 30.0–36.0)
MCV: 88.1 fL (ref 78.0–100.0)
Platelets: 259 10*3/uL (ref 150–400)
RBC: 5.23 MIL/uL — ABNORMAL HIGH (ref 3.87–5.11)
RDW: 13.9 % (ref 11.5–15.5)
WBC: 8 10*3/uL (ref 4.0–10.5)

## 2014-05-01 LAB — CBG MONITORING, ED: Glucose-Capillary: 306 mg/dL — ABNORMAL HIGH (ref 70–99)

## 2014-05-01 LAB — RAPID URINE DRUG SCREEN, HOSP PERFORMED
Amphetamines: NOT DETECTED
Barbiturates: NOT DETECTED
Benzodiazepines: NOT DETECTED
Cocaine: NOT DETECTED
OPIATES: NOT DETECTED
Tetrahydrocannabinol: POSITIVE — AB

## 2014-05-01 LAB — SALICYLATE LEVEL

## 2014-05-01 LAB — ACETAMINOPHEN LEVEL: Acetaminophen (Tylenol), Serum: <15 ug/mL (ref 10–30)

## 2014-05-01 LAB — ETHANOL: Alcohol, Ethyl (B): 157 mg/dL — ABNORMAL HIGH (ref 0–11)

## 2014-05-01 MED ORDER — SIMVASTATIN 40 MG PO TABS
40.0000 mg | ORAL_TABLET | Freq: Every day | ORAL | Status: DC
  Administered 2014-05-02: 40 mg via ORAL
  Filled 2014-05-01: qty 1

## 2014-05-01 MED ORDER — ELVITEG-COBIC-EMTRICIT-TENOFDF 150-150-200-300 MG PO TABS
1.0000 | ORAL_TABLET | Freq: Every day | ORAL | Status: DC
  Administered 2014-05-02: 1 via ORAL
  Filled 2014-05-01 (×2): qty 1

## 2014-05-01 MED ORDER — INSULIN ASPART 100 UNIT/ML ~~LOC~~ SOLN
16.0000 [IU] | Freq: Once | SUBCUTANEOUS | Status: AC
  Administered 2014-05-01: 16 [IU] via SUBCUTANEOUS
  Filled 2014-05-01: qty 1

## 2014-05-01 MED ORDER — FUROSEMIDE 20 MG PO TABS
20.0000 mg | ORAL_TABLET | Freq: Every day | ORAL | Status: DC
  Administered 2014-05-01 – 2014-05-02 (×2): 20 mg via ORAL
  Filled 2014-05-01 (×3): qty 1

## 2014-05-01 MED ORDER — INSULIN NPH (HUMAN) (ISOPHANE) 100 UNIT/ML ~~LOC~~ SUSP
100.0000 [IU] | Freq: Every day | SUBCUTANEOUS | Status: DC
  Administered 2014-05-02: 100 [IU] via SUBCUTANEOUS
  Filled 2014-05-01: qty 10

## 2014-05-01 MED ORDER — ATENOLOL 25 MG PO TABS
25.0000 mg | ORAL_TABLET | Freq: Every day | ORAL | Status: DC
  Filled 2014-05-01: qty 1

## 2014-05-01 MED ORDER — MOMETASONE FURO-FORMOTEROL FUM 200-5 MCG/ACT IN AERO
2.0000 | INHALATION_SPRAY | Freq: Two times a day (BID) | RESPIRATORY_TRACT | Status: DC
  Filled 2014-05-01: qty 8.8

## 2014-05-01 MED ORDER — AMLODIPINE BESYLATE 5 MG PO TABS
5.0000 mg | ORAL_TABLET | Freq: Every day | ORAL | Status: DC
  Administered 2014-05-02: 5 mg via ORAL
  Filled 2014-05-01: qty 1

## 2014-05-01 MED ORDER — TRAZODONE HCL 50 MG PO TABS
150.0000 mg | ORAL_TABLET | Freq: Every day | ORAL | Status: DC
  Administered 2014-05-01: 150 mg via ORAL
  Filled 2014-05-01 (×2): qty 1

## 2014-05-01 MED ORDER — PANTOPRAZOLE SODIUM 40 MG PO TBEC
40.0000 mg | DELAYED_RELEASE_TABLET | Freq: Every day | ORAL | Status: DC
  Administered 2014-05-01 – 2014-05-02 (×2): 40 mg via ORAL
  Filled 2014-05-01 (×3): qty 1

## 2014-05-01 MED ORDER — SODIUM CHLORIDE 0.9 % IV BOLUS (SEPSIS)
1000.0000 mL | Freq: Once | INTRAVENOUS | Status: AC
  Administered 2014-05-01: 1000 mL via INTRAVENOUS

## 2014-05-01 MED ORDER — CLONIDINE HCL 0.1 MG PO TABS
0.2000 mg | ORAL_TABLET | Freq: Two times a day (BID) | ORAL | Status: DC
  Administered 2014-05-01 – 2014-05-02 (×2): 0.2 mg via ORAL
  Filled 2014-05-01 (×2): qty 2

## 2014-05-01 MED ORDER — POTASSIUM CHLORIDE CRYS ER 20 MEQ PO TBCR
40.0000 meq | EXTENDED_RELEASE_TABLET | Freq: Once | ORAL | Status: AC
  Administered 2014-05-01: 40 meq via ORAL
  Filled 2014-05-01: qty 2

## 2014-05-01 MED ORDER — ENALAPRIL MALEATE 20 MG PO TABS
20.0000 mg | ORAL_TABLET | Freq: Every day | ORAL | Status: DC
  Administered 2014-05-02: 20 mg via ORAL
  Filled 2014-05-01: qty 1

## 2014-05-01 MED ORDER — INSULIN ISOPHANE HUMAN 100 UNIT/ML KWIKPEN: 100.0000 [IU] | PEN_INJECTOR | SUBCUTANEOUS | Status: DC
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
## 2014-05-01 NOTE — BH Assessment (Signed)
Assessment completed. Consulted with Darlyne Russian, PA-C who recommended inpatient treatment. Dr. Ashok Cordia has been notified of the recommendation. [facility] at capacity. TTS will contact other facilities for placement.

## 2014-05-01 NOTE — ED Notes (Signed)
Pt arrived to the ED escorted by [employer].  Pt states she has suicidal ideations.  Pt has a history of same.  Pt takes medications and agreed to come in voluntarily with [employer] to be checked out.  Pt has plan to take all her medications at one time in order to "end it all."
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
## 2014-05-01 NOTE — ED Notes (Signed)
Pt CBG [id number]
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
## 2014-05-01 NOTE — ED Notes (Signed)
Patient changed into wine colored scrubs. Patient aware that urine sample is needed, however, patient did not urinate in the specimen cup while in the bathroom.

## 2014-05-02 ENCOUNTER — Encounter (HOSPITAL_COMMUNITY): Payer: Self-pay | Admitting: Registered Nurse

## 2014-05-02 DIAGNOSIS — F332 Major depressive disorder, recurrent severe without psychotic features: Secondary | ICD-10-CM

## 2014-05-02 DIAGNOSIS — R45851 Suicidal ideations: Secondary | ICD-10-CM

## 2014-05-02 LAB — CBG MONITORING, ED
GLUCOSE-CAPILLARY: 144 mg/dL — AB (ref 70–99)
GLUCOSE-CAPILLARY: 192 mg/dL — AB (ref 70–99)
Glucose-Capillary: 191 mg/dL — ABNORMAL HIGH (ref 70–99)
Glucose-Capillary: 240 mg/dL — ABNORMAL HIGH (ref 70–99)

## 2014-05-02 NOTE — Discharge Instructions (Signed)
Major Depressive Disorder °Major depressive disorder is a mental illness. It also may be called clinical depression or unipolar depression. Major depressive disorder usually causes feelings of sadness, hopelessness, or helplessness. Some people with this disorder do not feel particularly sad but lose interest in doing things they used to enjoy (anhedonia). Major depressive disorder also can cause physical symptoms. It can interfere with work, school, relationships, and other normal everyday activities. The disorder varies in severity but is longer lasting and more serious than the sadness we all feel from time to time in our lives. °Major depressive disorder often is triggered by stressful life events or major life changes. Examples of these triggers include divorce, loss of your job or home, a move, and the death of a family member or close friend. Sometimes this disorder occurs for no obvious reason at all. People who have family members with major depressive disorder or bipolar disorder are at higher risk for developing this disorder, with or without life stressors. Major depressive disorder can occur at any age. It may occur just once in your life (single episode major depressive disorder). It may occur multiple times (recurrent major depressive disorder). °SYMPTOMS °People with major depressive disorder have either anhedonia or depressed mood on nearly a daily basis for at least 2 weeks or longer. Symptoms of depressed mood include: °· Feelings of sadness (blue or down in the dumps) or emptiness. °· Feelings of hopelessness or helplessness. °· Tearfulness or episodes of crying (may be observed by others). °· Irritability (children and adolescents). °In addition to depressed mood or anhedonia or both, people with this disorder have at least four of the following symptoms: °· Difficulty sleeping or sleeping too much.   °· Significant change (increase or decrease) in appetite or weight.   °· Lack of energy or  motivation. °· Feelings of guilt and worthlessness.   °· Difficulty concentrating, remembering, or making decisions. °· Unusually slow movement (psychomotor retardation) or restlessness (as observed by others).   °· Recurrent wishes for death, recurrent thoughts of self-harm (suicide), or a suicide attempt. °People with major depressive disorder commonly have persistent negative thoughts about themselves, other people, and the world. People with severe major depressive disorder may experience distorted beliefs or perceptions about the world (psychotic delusions). They also may see or hear things that are not real (psychotic hallucinations). °DIAGNOSIS °Major depressive disorder is diagnosed through an assessment by your health care provider. Your health care provider will ask about aspects of your daily life, such as mood, sleep, and appetite, to see if you have the diagnostic symptoms of major depressive disorder. Your health care provider may ask about your medical history and use of alcohol or drugs, including prescription medicines. Your health care provider also may do a physical exam and blood work. This is because certain medical conditions and the use of certain substances can cause major depressive disorder-like symptoms (secondary depression). Your health care provider also may refer you to a mental health specialist for further evaluation and treatment. °TREATMENT °It is important to recognize the symptoms of major depressive disorder and seek treatment. The following treatments can be prescribed for this disorder:   °· Medicine. Antidepressant medicines usually are prescribed. Antidepressant medicines are thought to correct chemical imbalances in the brain that are commonly associated with major depressive disorder. Other types of medicine may be added if the symptoms do not respond to antidepressant medicines alone or if psychotic delusions or hallucinations occur. °· Talk therapy. Talk therapy can be  helpful in treating major depressive disorder by providing   support, education, and guidance. Certain types of talk therapy also can help with negative thinking (cognitive behavioral therapy) and with relationship issues that trigger this disorder (interpersonal therapy). °A mental health specialist can help determine which treatment is best for you. Most people with major depressive disorder do well with a combination of medicine and talk therapy. Treatments involving electrical stimulation of the brain can be used in situations with extremely severe symptoms or when medicine and talk therapy do not work over time. These treatments include electroconvulsive therapy, transcranial magnetic stimulation, and vagal nerve stimulation. °Document Released: 12/15/2012 Document Revised: 01/04/2014 Document Reviewed: 12/15/2012 °[employer]® Patient Information ©2015 [employer]. This information is not intended to replace advice given to you by your health care provider. Make sure you discuss any questions you have with your health care provider. ° °

## 2014-05-02 NOTE — ED Notes (Signed)
Sheriffs deputy here to transport to [facility]. Belongings bag x1 given to officer. Ambulatory without difficulty. Denies pain. No complaints voiced.

## 2014-05-02 NOTE — ED Notes (Signed)
Sheriffs deputy called referencing transport to [facility]. Stated he would be here between 4-4:30.

## 2014-05-02 NOTE — ED Notes (Signed)
Pt. To [facility] from ED ambulatory without difficulty. Pt. Is alert and oriented, warm and dry in no distress. Pt. Denies HI, and pain. Pt. States she is still experiencing SI without a plan and hearing voices telling her "to not wake up". Pt. Calm and cooperative. Pt. Encouraged to let Nursing staff know if he has concerns or needs. Soft drink and crackers and cheese given.
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
## 2014-05-02 NOTE — BH Assessment (Signed)
Received call from [facility] declining patient.  Harrie Foreman A 05/02/2014 3:54 AM

## 2014-05-02 NOTE — BH Assessment (Signed)
05/02/2014  Lavell Luster, [facility] at [facility] has stated that the Adult Unit is at capacity. This Probation officer has contacted the following facilities for placement:  Information Faxed; Patient under review: [facility] per [place] per [place] per [facility] per [facility] per [place]: [facility] per [facility] per [facility] Fe[facility] per [facility] per [facility] per [facility] per [facility] per [facility] per [facility] per [facility] per [facility] per [place] per [facility]   No Response: [facility] A 2:08 AM

## 2014-05-02 NOTE — BH Assessment (Signed)
[place] Assessment Progress Note  Update:  Received call from [facility] at [facility] [phone]) @ [phone] stating pt accepted there to Dr. Audrie Lia to bed S111 and could arrive there after 9 AM.  Number for nurse to nurse report is [phone].  Updated TTS and ED staff.  Shaune Pascal, MS, [facility] Licensed Professional Counselor Triage Specialist

## 2014-05-02 NOTE — ED Notes (Signed)
Report called to [place] at [facility]. Accepted by [facility] ([facility] to room S111. Will be transported via [employer]

## 2014-05-11 ENCOUNTER — Other Ambulatory Visit: Payer: Self-pay | Admitting: Internal Medicine

## 2014-05-12 ENCOUNTER — Ambulatory Visit: Payer: Medicare Other | Admitting: Internal Medicine

## 2014-05-19 ENCOUNTER — Ambulatory Visit (INDEPENDENT_AMBULATORY_CARE_PROVIDER_SITE_OTHER): Payer: Medicare Other | Admitting: Endocrinology

## 2014-05-19 ENCOUNTER — Encounter: Payer: Self-pay | Admitting: Endocrinology

## 2014-05-19 VITALS — BP 118/60 | HR 59 | Temp 98.4°F | Ht 63.0 in | Wt 281.0 lb

## 2014-05-19 DIAGNOSIS — E1149 Type 2 diabetes mellitus with other diabetic neurological complication: Secondary | ICD-10-CM

## 2014-05-19 MED ORDER — GLUCOSE BLOOD VI STRP: 1.0000 | ORAL_STRIP | Freq: Two times a day (BID) | Status: DC
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
## 2014-05-19 NOTE — Patient Instructions (Addendum)
Please continue the same insulin.   blood tests are being requested for you today.  We'll contact you with results. Here is a new meter.  i have sent a prescription to your pharmacy, for strips. Please come back for a follow-up appointment in 2 months.  check your blood sugar twice a day.  vary the time of day when you check, between before the 3 meals, and at bedtime.  also check if you have symptoms of your blood sugar being too high or too low.  please keep a record of the readings and bring it to your next appointment here.  please call us sooner if your blood sugar goes below 70, or if you have a lot of readings over 200.

## 2014-05-20 LAB — HEMOGLOBIN A1C: Hgb A1c MFr Bld: 9.8 % — ABNORMAL HIGH (ref 4.6–6.5)

## 2014-05-25 ENCOUNTER — Other Ambulatory Visit: Payer: Medicare Other

## 2014-05-25 DIAGNOSIS — B20 Human immunodeficiency virus [HIV] disease: Secondary | ICD-10-CM

## 2014-05-25 LAB — CBC WITH DIFFERENTIAL/PLATELET
BASOS PCT: 0 % (ref 0–1)
Basophils Absolute: 0 10*3/uL (ref 0.0–0.1)
Eosinophils Absolute: 0.2 10*3/uL (ref 0.0–0.7)
Eosinophils Relative: 3 % (ref 0–5)
HEMATOCRIT: 42.6 % (ref 36.0–46.0)
Hemoglobin: 14.3 g/dL (ref 12.0–15.0)
LYMPHS PCT: 35 % (ref 12–46)
Lymphs Abs: 2.4 10*3/uL (ref 0.7–4.0)
MCH: 29.6 pg (ref 26.0–34.0)
MCHC: 33.6 g/dL (ref 30.0–36.0)
MCV: 88.2 fL (ref 78.0–100.0)
MONO ABS: 0.5 10*3/uL (ref 0.1–1.0)
Monocytes Relative: 7 % (ref 3–12)
NEUTROS ABS: 3.7 10*3/uL (ref 1.7–7.7)
NEUTROS PCT: 55 % (ref 43–77)
Platelets: 191 10*3/uL (ref 150–400)
RBC: 4.83 MIL/uL (ref 3.87–5.11)
RDW: 14.5 % (ref 11.5–15.5)
WBC: 6.8 10*3/uL (ref 4.0–10.5)

## 2014-05-25 LAB — COMPLETE METABOLIC PANEL WITH GFR
ALK PHOS: 119 U/L — AB (ref 39–117)
ALT: 16 U/L (ref 0–35)
AST: 14 U/L (ref 0–37)
Albumin: 3.8 g/dL (ref 3.5–5.2)
BILIRUBIN TOTAL: 0.3 mg/dL (ref 0.2–1.2)
BUN: 8 mg/dL (ref 6–23)
CO2: 24 meq/L (ref 19–32)
Calcium: 9 mg/dL (ref 8.4–10.5)
Chloride: 99 mEq/L (ref 96–112)
Creat: 0.95 mg/dL (ref 0.50–1.10)
GFR, EST AFRICAN AMERICAN: 80 mL/min
GFR, EST NON AFRICAN AMERICAN: 70 mL/min
GLUCOSE: 432 mg/dL — AB (ref 70–99)
Potassium: 4.7 mEq/L (ref 3.5–5.3)
Sodium: 134 mEq/L — ABNORMAL LOW (ref 135–145)
TOTAL PROTEIN: 6.4 g/dL (ref 6.0–8.3)

## 2014-05-26 LAB — HIV-1 RNA QUANT-NO REFLEX-BLD
HIV 1 RNA Quant: <20 copies/mL (ref <20)
HIV-1 RNA Quant, Log: <1.3 {Log} (ref <1.30)

## 2014-05-26 LAB — T-HELPER CELL (CD4) - (RCID CLINIC ONLY)
CD4 T CELL ABS: 600 /uL (ref 400–2700)
CD4 T CELL HELPER: 24 % — AB (ref 33–55)

## 2014-06-02 ENCOUNTER — Ambulatory Visit (INDEPENDENT_AMBULATORY_CARE_PROVIDER_SITE_OTHER): Payer: Medicare Other | Admitting: Internal Medicine

## 2014-06-02 ENCOUNTER — Encounter: Payer: Self-pay | Admitting: Internal Medicine

## 2014-06-02 VITALS — BP 180/100 | HR 78 | Temp 98.5°F | Wt 285.0 lb

## 2014-06-02 DIAGNOSIS — Z113 Encounter for screening for infections with a predominantly sexual mode of transmission: Secondary | ICD-10-CM

## 2014-06-02 DIAGNOSIS — B20 Human immunodeficiency virus [HIV] disease: Secondary | ICD-10-CM

## 2014-06-02 DIAGNOSIS — Z23 Encounter for immunization: Secondary | ICD-10-CM

## 2014-06-02 NOTE — Progress Notes (Signed)
  Subjective:    Patient ID: Susan [name], female    DOB: October 18, 1960, 53 y.o.   MRN: [id number]  HPI She comes in for followup of her HIV. She was changed last year to [place] do to her underlying psychiatric illnesses. She has noted a significant improvement in energy. No big change in anxiety or depression but she feels much better. Remains undetectable with CD4 of 600.  Denies any missed doses.  Continues to smoke and trying to improve sugar management.    Review of Systems  Constitutional: Negative for fatigue.  HENT: Negative for sore throat and trouble swallowing.   Gastrointestinal: Negative for diarrhea.  Musculoskeletal: Negative for myalgias.  Skin: Negative for rash.  Neurological: Negative for dizziness and headaches.       Objective:   Physical Exam  Constitutional: She appears well-developed and well-nourished. No distress.  HENT:  Mouth/Throat: Oropharynx is clear and moist. No oropharyngeal exudate.  Cardiovascular: Normal rate, regular rhythm and normal heart sounds.   No murmur heard. Pulmonary/Chest: Effort normal and breath sounds normal. No respiratory distress. She has no wheezes.  Lymphadenopathy:    She has no cervical adenopathy.          Assessment & Plan:

## 2014-06-02 NOTE — Assessment & Plan Note (Signed)
Doing great.  Labs reviewed.  Flu shot.  RTC 6 months.

## 2014-06-07 ENCOUNTER — Encounter: Payer: Self-pay | Admitting: *Deleted

## 2014-07-05 ENCOUNTER — Encounter: Payer: Self-pay | Admitting: Internal Medicine

## 2014-07-19 ENCOUNTER — Ambulatory Visit: Payer: Self-pay | Admitting: Endocrinology

## 2014-07-20 ENCOUNTER — Telehealth: Payer: Self-pay | Admitting: Endocrinology

## 2014-07-20 NOTE — Telephone Encounter (Signed)
Follow up advised. Contact patient and schedule visit within 4 weeks.

## 2014-07-20 NOTE — Telephone Encounter (Signed)
Lvom advising pt to call back and reschedule appointment.  

## 2014-07-20 NOTE — Telephone Encounter (Signed)
Patient no showed today's appt. Please advise on how to follow up. °A. No follow up necessary. °B. Follow up urgent. Contact patient immediately. °C. Follow up necessary. Contact patient and schedule visit in ___ days. °D. Follow up advised. Contact patient and schedule visit in ____weeks. ° °

## 2014-07-27 ENCOUNTER — Ambulatory Visit: Payer: Medicare Other | Admitting: Internal Medicine

## 2014-09-08 ENCOUNTER — Other Ambulatory Visit: Payer: Self-pay | Admitting: Internal Medicine

## 2014-09-08 DIAGNOSIS — Z1231 Encounter for screening mammogram for malignant neoplasm of breast: Secondary | ICD-10-CM

## 2014-09-14 ENCOUNTER — Encounter: Payer: Self-pay | Admitting: Podiatry

## 2014-09-14 ENCOUNTER — Ambulatory Visit (INDEPENDENT_AMBULATORY_CARE_PROVIDER_SITE_OTHER): Payer: 59 | Admitting: Podiatry

## 2014-09-14 VITALS — Ht 63.0 in | Wt 289.0 lb

## 2014-09-14 DIAGNOSIS — B351 Tinea unguium: Secondary | ICD-10-CM | POA: Insufficient documentation

## 2014-09-14 DIAGNOSIS — M79606 Pain in leg, unspecified: Secondary | ICD-10-CM | POA: Diagnosis not present

## 2014-09-14 MED ORDER — CARRINGTON MOISTURE BARRIER EX CREA
TOPICAL_CREAM | CUTANEOUS | Status: DC | PRN
Start: 2014-09-14 — End: 2015-02-08

## 2014-09-14 NOTE — Patient Instructions (Signed)
Seen for hypertrophic nails. All nails debrided. Return in 3 months or as needed.  

## 2014-09-14 NOTE — Progress Notes (Signed)
Subjective:  54 year old female presents stating that she is diabetic, HIV positive, and nails are hurting.  Stated that her blood sugar is under control. Average is 105. Feet are dry even after using regular lotion.  Review of Systems - General ROS is negative other than mentioned above.   Objective: Dermatologic: Thick curbed dystrophic nails x 10. Dry skin both feet. Vascular: All pedal pulses are palpable. No edema or erythema noted. Neurologic: All epicritic and tactile sensations grossly intact. Orthopedic: Rectus foot without gross deformity.   Assessment: Onychogryphosis both feet. Painful feet.  Plan: Debrided all nails.  Return in 3 months.

## 2014-10-05 ENCOUNTER — Ambulatory Visit (HOSPITAL_COMMUNITY)
Admission: RE | Admit: 2014-10-05 | Discharge: 2014-10-05 | Disposition: A | Discharge status: Home / Self Care | Payer: 59 | Source: Ambulatory Visit | Attending: Internal Medicine | Admitting: Internal Medicine

## 2014-10-05 DIAGNOSIS — Z1231 Encounter for screening mammogram for malignant neoplasm of breast: Secondary | ICD-10-CM | POA: Diagnosis not present

## 2014-10-20 ENCOUNTER — Emergency Department (HOSPITAL_COMMUNITY)
Admission: EM | Admit: 2014-10-20 | Discharge: 2014-10-20 | Disposition: A | Discharge status: Home / Self Care | Payer: Medicare Other | Attending: Emergency Medicine | Admitting: Emergency Medicine

## 2014-10-20 ENCOUNTER — Encounter (HOSPITAL_COMMUNITY): Payer: Self-pay | Admitting: *Deleted

## 2014-10-20 ENCOUNTER — Emergency Department (HOSPITAL_COMMUNITY): Payer: Medicare Other

## 2014-10-20 DIAGNOSIS — R110 Nausea: Secondary | ICD-10-CM | POA: Diagnosis not present

## 2014-10-20 DIAGNOSIS — R51 Headache: Secondary | ICD-10-CM

## 2014-10-20 DIAGNOSIS — E1165 Type 2 diabetes mellitus with hyperglycemia: Secondary | ICD-10-CM | POA: Diagnosis not present

## 2014-10-20 DIAGNOSIS — F329 Major depressive disorder, single episode, unspecified: Secondary | ICD-10-CM | POA: Diagnosis not present

## 2014-10-20 DIAGNOSIS — Z79899 Other long term (current) drug therapy: Secondary | ICD-10-CM | POA: Diagnosis not present

## 2014-10-20 DIAGNOSIS — J4520 Mild intermittent asthma, uncomplicated: Secondary | ICD-10-CM | POA: Diagnosis not present

## 2014-10-20 DIAGNOSIS — R05 Cough: Secondary | ICD-10-CM | POA: Diagnosis not present

## 2014-10-20 DIAGNOSIS — F17200 Nicotine dependence, unspecified, uncomplicated: Secondary | ICD-10-CM | POA: Diagnosis not present

## 2014-10-20 DIAGNOSIS — M545 Low back pain, unspecified: Secondary | ICD-10-CM

## 2014-10-20 DIAGNOSIS — Z3202 Encounter for pregnancy test, result negative: Secondary | ICD-10-CM | POA: Diagnosis not present

## 2014-10-20 DIAGNOSIS — Z720 Tobacco use: Secondary | ICD-10-CM | POA: Insufficient documentation

## 2014-10-20 DIAGNOSIS — F419 Anxiety disorder, unspecified: Secondary | ICD-10-CM | POA: Diagnosis not present

## 2014-10-20 DIAGNOSIS — M62830 Muscle spasm of back: Secondary | ICD-10-CM | POA: Insufficient documentation

## 2014-10-20 DIAGNOSIS — R1030 Lower abdominal pain, unspecified: Secondary | ICD-10-CM | POA: Diagnosis not present

## 2014-10-20 DIAGNOSIS — R0981 Nasal congestion: Secondary | ICD-10-CM

## 2014-10-20 DIAGNOSIS — Z87828 Personal history of other (healed) physical injury and trauma: Secondary | ICD-10-CM | POA: Insufficient documentation

## 2014-10-20 DIAGNOSIS — Z21 Asymptomatic human immunodeficiency virus [HIV] infection status: Secondary | ICD-10-CM | POA: Insufficient documentation

## 2014-10-20 DIAGNOSIS — J45901 Unspecified asthma with (acute) exacerbation: Secondary | ICD-10-CM | POA: Insufficient documentation

## 2014-10-20 DIAGNOSIS — G43909 Migraine, unspecified, not intractable, without status migrainosus: Secondary | ICD-10-CM | POA: Diagnosis not present

## 2014-10-20 DIAGNOSIS — Z8673 Personal history of transient ischemic attack (TIA), and cerebral infarction without residual deficits: Secondary | ICD-10-CM | POA: Diagnosis not present

## 2014-10-20 DIAGNOSIS — I10 Essential (primary) hypertension: Secondary | ICD-10-CM | POA: Insufficient documentation

## 2014-10-20 DIAGNOSIS — M179 Osteoarthritis of knee, unspecified: Secondary | ICD-10-CM | POA: Insufficient documentation

## 2014-10-20 DIAGNOSIS — Z794 Long term (current) use of insulin: Secondary | ICD-10-CM | POA: Insufficient documentation

## 2014-10-20 DIAGNOSIS — J069 Acute upper respiratory infection, unspecified: Secondary | ICD-10-CM | POA: Diagnosis not present

## 2014-10-20 DIAGNOSIS — R519 Headache, unspecified: Secondary | ICD-10-CM

## 2014-10-20 DIAGNOSIS — H53149 Visual discomfort, unspecified: Secondary | ICD-10-CM | POA: Diagnosis not present

## 2014-10-20 DIAGNOSIS — E780 Pure hypercholesterolemia: Secondary | ICD-10-CM | POA: Insufficient documentation

## 2014-10-20 DIAGNOSIS — K219 Gastro-esophageal reflux disease without esophagitis: Secondary | ICD-10-CM | POA: Diagnosis not present

## 2014-10-20 LAB — COMPREHENSIVE METABOLIC PANEL
ALBUMIN: 3.5 g/dL (ref 3.5–5.2)
ALK PHOS: 121 U/L — AB (ref 39–117)
ALT: 24 U/L (ref 0–35)
ANION GAP: 10 (ref 5–15)
AST: 25 U/L (ref 0–37)
BILIRUBIN TOTAL: 0.3 mg/dL (ref 0.3–1.2)
CO2: 25 mmol/L (ref 19–32)
CREATININE: 0.96 mg/dL (ref 0.50–1.10)
Calcium: 9.2 mg/dL (ref 8.4–10.5)
Chloride: 98 mmol/L (ref 96–112)
GFR, EST AFRICAN AMERICAN: 77 mL/min — AB (ref >90)
GFR, EST NON AFRICAN AMERICAN: 67 mL/min — AB (ref >90)
GLUCOSE: 363 mg/dL — AB (ref 70–99)
POTASSIUM: 3.9 mmol/L (ref 3.5–5.1)
SODIUM: 133 mmol/L — AB (ref 135–145)
Total Protein: 7.2 g/dL (ref 6.0–8.3)

## 2014-10-20 LAB — CBC WITH DIFFERENTIAL/PLATELET
BASOS ABS: 0 10*3/uL (ref 0.0–0.1)
BASOS PCT: 0 % (ref 0–1)
EOS ABS: 0.5 10*3/uL (ref 0.0–0.7)
EOS PCT: 6 % — AB (ref 0–5)
HCT: 42.3 % (ref 36.0–46.0)
Hemoglobin: 14.7 g/dL (ref 12.0–15.0)
Lymphocytes Relative: 24 % (ref 12–46)
Lymphs Abs: 2.3 10*3/uL (ref 0.7–4.0)
MCH: 30.9 pg (ref 26.0–34.0)
MCHC: 34.8 g/dL (ref 30.0–36.0)
MCV: 88.9 fL (ref 78.0–100.0)
Monocytes Absolute: 0.5 10*3/uL (ref 0.1–1.0)
Monocytes Relative: 5 % (ref 3–12)
NEUTROS PCT: 65 % (ref 43–77)
Neutro Abs: 6.1 10*3/uL (ref 1.7–7.7)
Platelets: 320 10*3/uL (ref 150–400)
RBC: 4.76 MIL/uL (ref 3.87–5.11)
RDW: 13.1 % (ref 11.5–15.5)
WBC: 9.4 10*3/uL (ref 4.0–10.5)

## 2014-10-20 LAB — URINALYSIS, ROUTINE W REFLEX MICROSCOPIC
BILIRUBIN URINE: NEGATIVE
Glucose, UA: >1000 mg/dL — AB
HGB URINE DIPSTICK: NEGATIVE
KETONES UR: NEGATIVE mg/dL
LEUKOCYTES UA: NEGATIVE
Nitrite: NEGATIVE
PH: 5.5 (ref 5.0–8.0)
Protein, ur: NEGATIVE mg/dL
Specific Gravity, Urine: 1.028 (ref 1.005–1.030)
Urobilinogen, UA: 0.2 mg/dL (ref 0.0–1.0)

## 2014-10-20 LAB — URINE MICROSCOPIC-ADD ON

## 2014-10-20 LAB — PREGNANCY, URINE: PREG TEST UR: NEGATIVE

## 2014-10-20 LAB — LIPASE, BLOOD: Lipase: 19 U/L (ref 11–59)

## 2014-10-20 IMAGING — DX DG CHEST 2V
2 series · 2 of 2 positions shown · non-contrast
Comparison: [DATE].

CLINICAL DATA: Cough for the past week.  Back pain.  Smoker.

EXAM:
CHEST  2 VIEW

[chest pa]
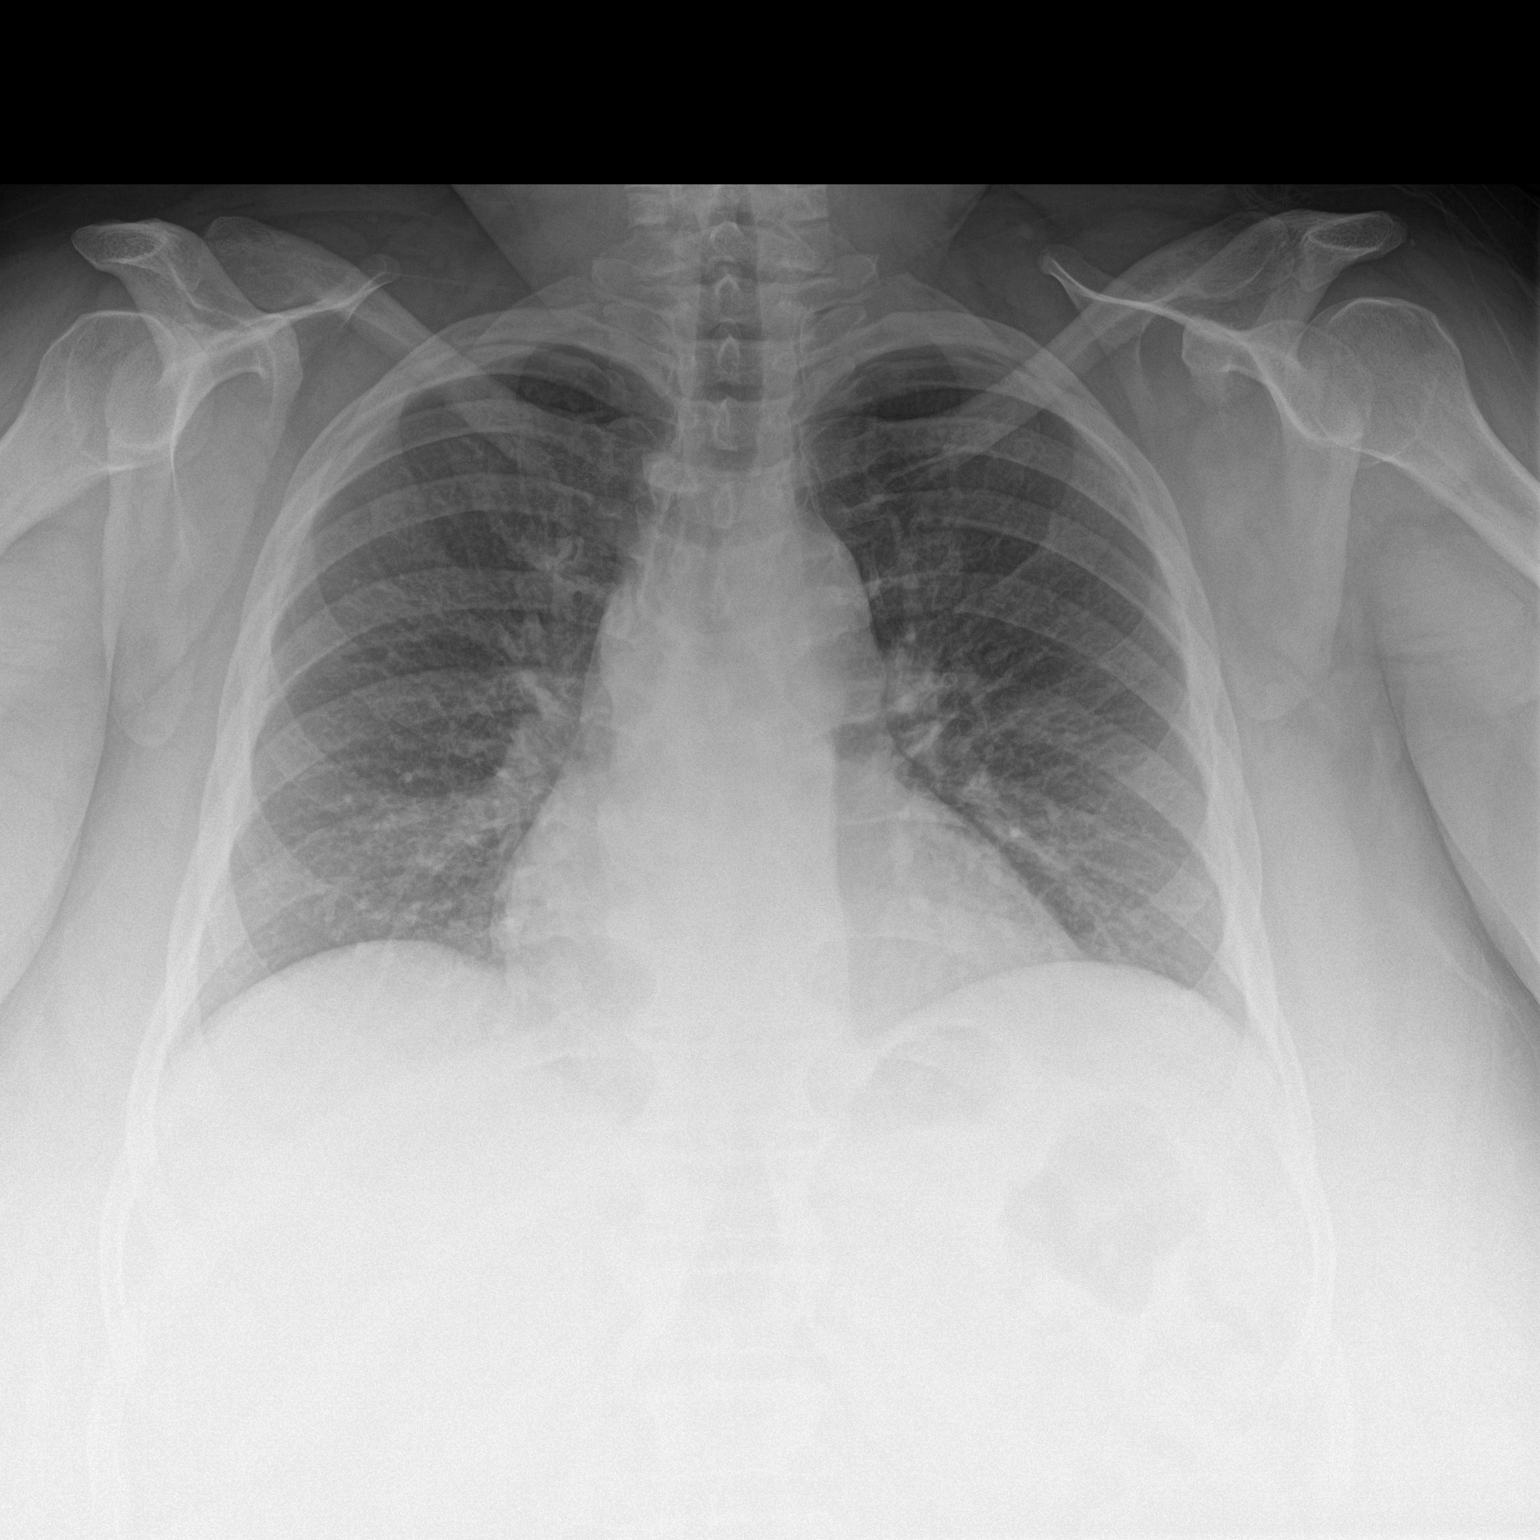

[chest lat]
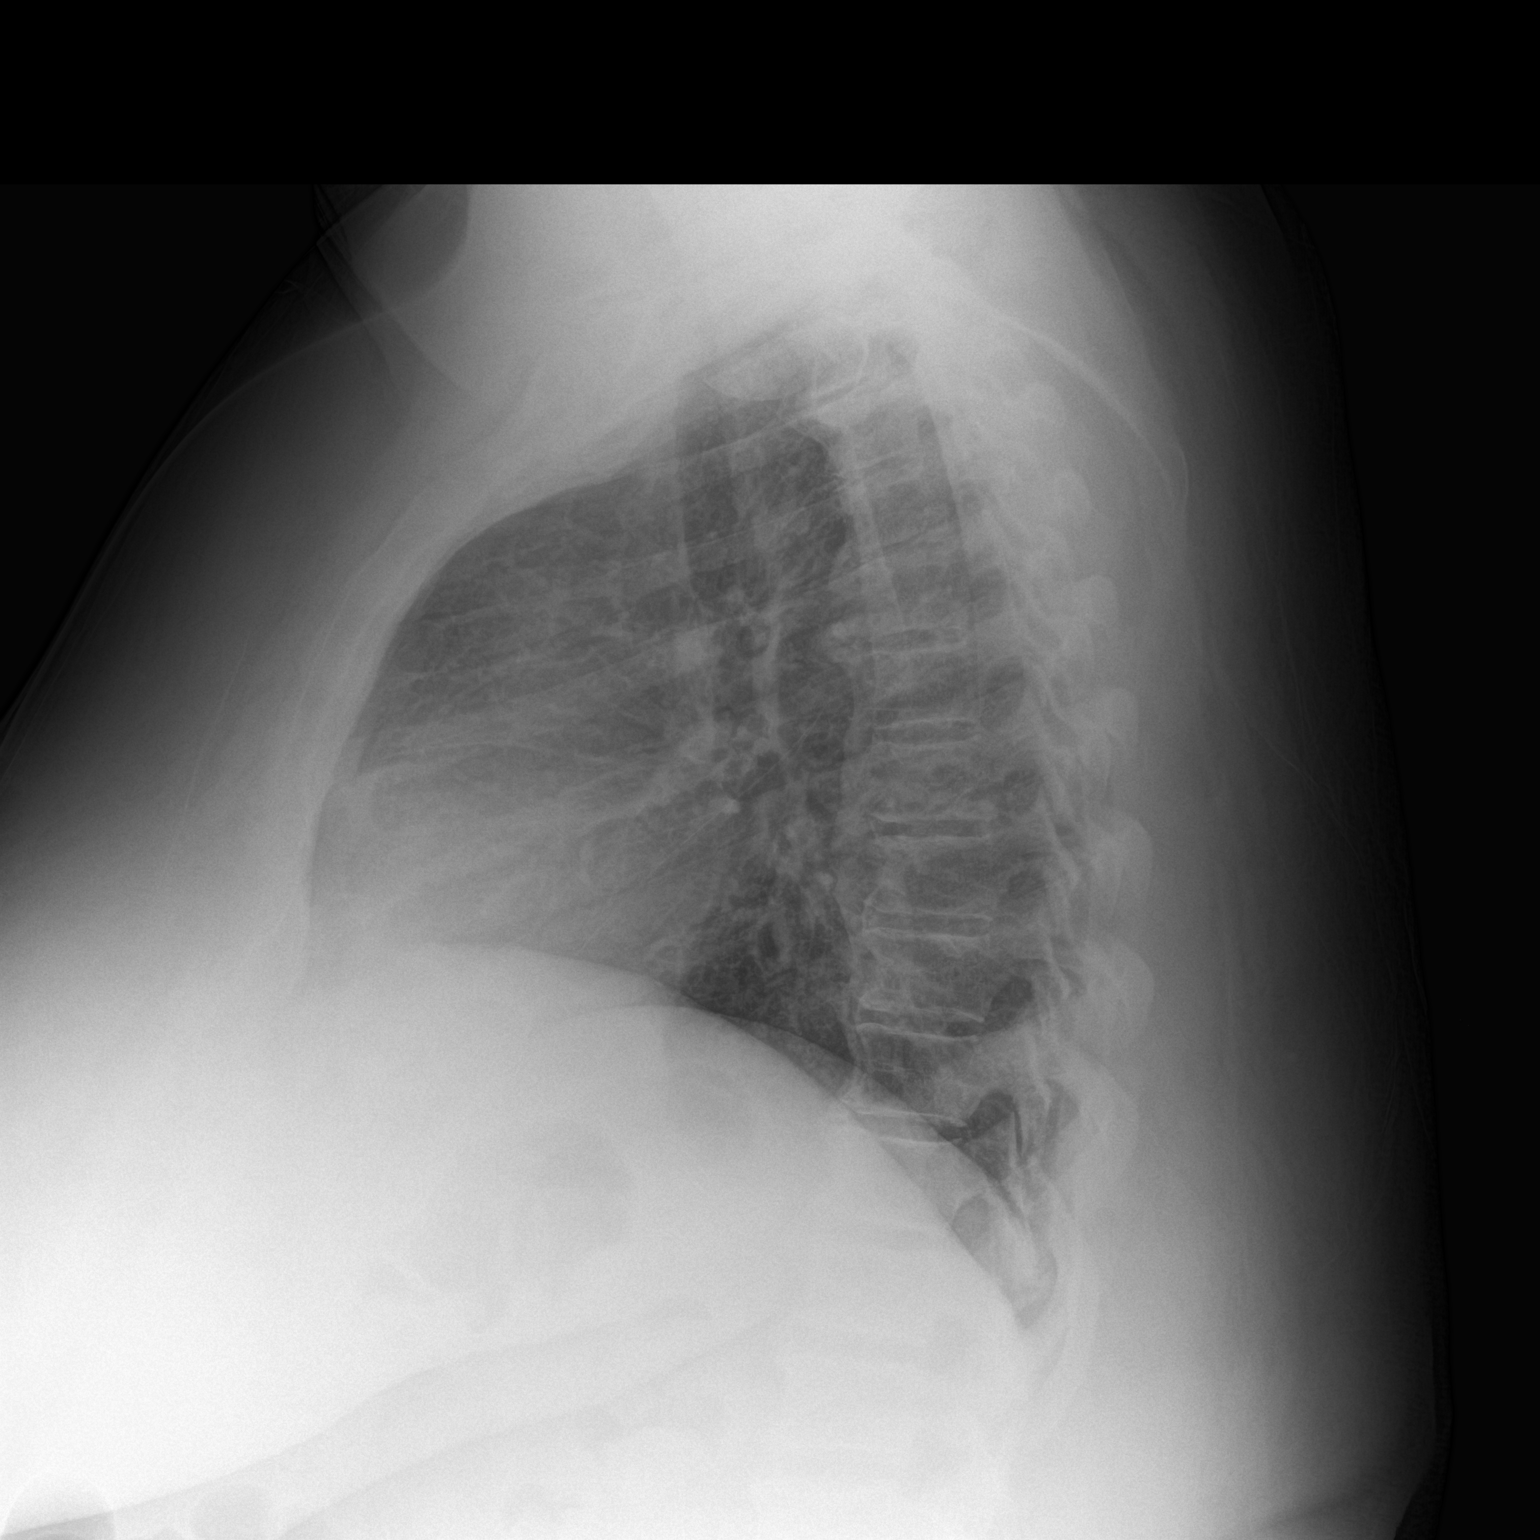

[2 of 2 positions shown; findings below may reference images not displayed]

FINDINGS: Poor inspiration. Normal sized heart. Clear lungs. Stable mild
diffuse peribronchial thickening and accentuation of the
interstitial markings. Thoracic spine degenerative changes,
including changes of DISH.
IMPRESSION: No acute abnormality. Stable mild chronic bronchitic changes and
mild chronic interstitial lung disease compatible with the history
of smoking.

## 2014-10-20 MED ORDER — METOCLOPRAMIDE HCL 10 MG PO TABS: 10.0000 mg | ORAL_TABLET | Freq: Three times a day (TID) | ORAL | Status: DC | PRN

## 2014-10-20 MED ORDER — MORPHINE SULFATE 4 MG/ML IJ SOLN
4.0000 mg | Freq: Once | INTRAMUSCULAR | Status: AC
  Administered 2014-10-20: 4 mg via INTRAVENOUS
  Filled 2014-10-20: qty 1

## 2014-10-20 MED ORDER — CYCLOBENZAPRINE HCL 10 MG PO TABS: 10.0000 mg | ORAL_TABLET | Freq: Three times a day (TID) | ORAL | Status: DC | PRN

## 2014-10-20 MED ORDER — DIPHENHYDRAMINE HCL 50 MG/ML IJ SOLN
25.0000 mg | Freq: Once | INTRAMUSCULAR | Status: AC
Start: 2014-10-20 — End: 2014-10-20
  Administered 2014-10-20: 25 mg via INTRAVENOUS
  Filled 2014-10-20: qty 1

## 2014-10-20 MED ORDER — METOCLOPRAMIDE HCL 5 MG/ML IJ SOLN
10.0000 mg | Freq: Once | INTRAMUSCULAR | Status: AC
Start: 2014-10-20 — End: 2014-10-20
  Administered 2014-10-20: 10 mg via INTRAVENOUS
  Filled 2014-10-20: qty 2

## 2014-10-20 MED ORDER — ALBUTEROL SULFATE HFA 108 (90 BASE) MCG/ACT IN AERS
2.0000 | INHALATION_SPRAY | Freq: Once | RESPIRATORY_TRACT | Status: AC
  Administered 2014-10-20: 2 via RESPIRATORY_TRACT
  Filled 2014-10-20: qty 6.7

## 2014-10-20 MED ORDER — CYCLOBENZAPRINE HCL 10 MG PO TABS
5.0000 mg | ORAL_TABLET | Freq: Once | ORAL | Status: AC
  Administered 2014-10-20: 5 mg via ORAL
  Filled 2014-10-20: qty 1

## 2014-10-20 MED ORDER — HYDROCODONE-ACETAMINOPHEN 5-325 MG PO TABS: 1.0000 | ORAL_TABLET | ORAL | Status: DC | PRN

## 2014-10-20 MED ORDER — FLUTICASONE PROPIONATE 50 MCG/ACT NA SUSP: 2.0000 | Freq: Every day | NASAL | Status: DC

## 2014-10-20 MED ORDER — SODIUM CHLORIDE 0.9 % IV BOLUS (SEPSIS)
1000.0000 mL | Freq: Once | INTRAVENOUS | Status: AC
  Administered 2014-10-20: 1000 mL via INTRAVENOUS

## 2014-10-20 NOTE — ED Notes (Signed)
Susan [name], [place] ok to send home pt with high bp.  Pt has meds at home.

## 2014-10-20 NOTE — ED Notes (Signed)
Pt made aware to return if symptoms worsen or if any life threatening symptoms occur.   

## 2014-10-20 NOTE — ED Notes (Signed)
Pt reports lower back pain and lower abdominal pain.  Pt reports dark urine and burning upon urination.  Pt alert and oriented.

## 2014-10-20 NOTE — ED Notes (Signed)
The pt is c/o lower back pain lt and rt for one week.  The pt has hiv and the last time she had thgus pain she had pneumonia.  No temp urinary frequency.  Hx of diabetes also

## 2014-10-20 NOTE — Discharge Instructions (Signed)
Continue to stay well-hydrated. Continue to alternate between Ibuprofen or norco for pain but don't drive while using norco. Use flexeril as needed for muscle spasms/back pain, and use heat for 20 minutes every hour to help with pain. Use Mucinex for cough suppression/expectoration of mucus. Use netipot and flonase to help with nasal congestion. May consider over-the-counter Benadryl or other antihistamine to decrease secretions and for watery itchy eyes. Use inhaler as directed, as needed for cough/chest congestion. Use coricidin for sinus congestion. Use reglan as needed for nausea or headaches. Followup with your primary care doctor in 5-7 days for recheck of ongoing symptoms. Return to emergency department for emergent changing or worsening of symptoms.   Upper Respiratory Infection, Adult An upper respiratory infection (URI) is also sometimes known as the common cold. The upper respiratory tract includes the nose, sinuses, throat, trachea, and bronchi. Bronchi are the airways leading to the lungs. Most people improve within 1 week, but symptoms can last up to 2 weeks. A residual cough may last even longer.  CAUSES Many different viruses can infect the tissues lining the upper respiratory tract. The tissues become irritated and inflamed and often become very moist. Mucus production is also common. A cold is contagious. You can easily spread the virus to others by oral contact. This includes kissing, sharing a glass, coughing, or sneezing. Touching your mouth or nose and then touching a surface, which is then touched by another person, can also spread the virus. SYMPTOMS  Symptoms typically develop 1 to 3 days after you come in contact with a cold virus. Symptoms vary from person to person. They may include:  Runny nose.  Sneezing.  Nasal congestion.  Sinus irritation.  Sore throat.  Loss of voice (laryngitis).  Cough.  Fatigue.  Muscle aches.  Loss of  appetite.  Headache.  Low-grade fever. DIAGNOSIS  You might diagnose your own cold based on familiar symptoms, since most people get a cold 2 to 3 times a year. Your caregiver can confirm this based on your exam. Most importantly, your caregiver can check that your symptoms are not due to another disease such as strep throat, sinusitis, pneumonia, asthma, or epiglottitis. Blood tests, throat tests, and X-rays are not necessary to diagnose a common cold, but they may sometimes be helpful in excluding other more serious diseases. Your caregiver will decide if any further tests are required. RISKS AND COMPLICATIONS  You may be at risk for a more severe case of the common cold if you smoke cigarettes, have chronic heart disease (such as heart failure) or lung disease (such as asthma), or if you have a weakened immune system. The very young and very old are also at risk for more serious infections. Bacterial sinusitis, middle ear infections, and bacterial pneumonia can complicate the common cold. The common cold can worsen asthma and chronic obstructive pulmonary disease (COPD). Sometimes, these complications can require emergency medical care and may be life-threatening. PREVENTION  The best way to protect against getting a cold is to practice good hygiene. Avoid oral or hand contact with people with cold symptoms. Wash your hands often if contact occurs. There is no clear evidence that vitamin C, vitamin E, echinacea, or exercise reduces the chance of developing a cold. However, it is always recommended to get plenty of rest and practice good nutrition. TREATMENT  Treatment is directed at relieving symptoms. There is no cure. Antibiotics are not effective, because the infection is caused by a virus, not by bacteria. Treatment may  include:  Increased fluid intake. Sports drinks offer valuable electrolytes, sugars, and fluids.  Breathing heated mist or steam (vaporizer or shower).  Eating chicken soup  or other clear broths, and maintaining good nutrition.  Getting plenty of rest.  Using gargles or lozenges for comfort.  Controlling fevers with ibuprofen or acetaminophen as directed by your caregiver.  Increasing usage of your inhaler if you have asthma. Zinc gel and zinc lozenges, taken in the first 24 hours of the common cold, can shorten the duration and lessen the severity of symptoms. Pain medicines may help with fever, muscle aches, and throat pain. A variety of non-prescription medicines are available to treat congestion and runny nose. Your caregiver can make recommendations and may suggest nasal or lung inhalers for other symptoms.  HOME CARE INSTRUCTIONS   Only take over-the-counter or prescription medicines for pain, discomfort, or fever as directed by your caregiver.  Use a warm mist humidifier or inhale steam from a shower to increase air moisture. This may keep secretions moist and make it easier to breathe.  Drink enough water and fluids to keep your urine clear or pale yellow.  Rest as needed.  Return to work when your temperature has returned to normal or as your caregiver advises. You may need to stay home longer to avoid infecting others. You can also use a face mask and careful hand washing to prevent spread of the virus. SEEK MEDICAL CARE IF:   After the first few days, you feel you are getting worse rather than better.  You need your caregiver's advice about medicines to control symptoms.  You develop chills, worsening shortness of breath, or brown or red sputum. These may be signs of pneumonia.  You develop yellow or brown nasal discharge or pain in the face, especially when you bend forward. These may be signs of sinusitis.  You develop a fever, swollen neck glands, pain with swallowing, or white areas in the back of your throat. These may be signs of strep throat. SEEK IMMEDIATE MEDICAL CARE IF:   You have a fever.  You develop severe or persistent  headache, ear pain, sinus pain, or chest pain.  You develop wheezing, a prolonged cough, cough up blood, or have a change in your usual mucus (if you have chronic lung disease).  You develop sore muscles or a stiff neck. Document Released: 02/13/2001 Document Revised: 11/12/2011 Document Reviewed: 11/25/2013 [facility] Patient Information 2015 [place], [place]. This information is not intended to replace advice given to you by your health care provider. Make sure you discuss any questions you have with your health care provider.  Sinus Headache A sinus headache happens when your sinuses become clogged or puffy (swollen). Sinus headaches can be mild or severe. HOME CARE  Take your medicines (antibiotics) as told. Finish them even if you start to feel better.  Only take medicine as told by your doctor.  Use a nose spray if you feel stuffed up (congested). GET HELP RIGHT AWAY IF:  You have a fever.  You have trouble seeing.  You suddenly have pain in your face or head.  You start to twitch or shake (seizure).  You are confused.  You get headaches more than once a week.  Light or sound bothers you.  You feel sick to your stomach (nauseous) or throw up (vomit).  Your headaches do not get better with treatment. MAKE SURE YOU:  Understand these instructions.  Will watch your condition.  Will get help right away if  you are not doing well or get worse. Document Released: 12/20/2010 Document Revised: 11/12/2011 Document Reviewed: 12/20/2010 [facility] Patient Information 2015 [place], [place]. This information is not intended to replace advice given to you by your health care provider. Make sure you discuss any questions you have with your health care provider.  Nausea, Adult Nausea means you feel sick to your stomach or need to throw up (vomit). It may be a sign of a more serious problem. If nausea gets worse, you may throw up. If you throw up a lot, you may lose too much body fluid  (dehydration). HOME CARE   Get plenty of rest.  Ask your doctor how to replace body fluid losses (rehydrate).  Eat small amounts of food. Sip liquids more often.  Take all medicines as told by your doctor. GET HELP RIGHT AWAY IF:  You have a fever.  You pass out (faint).  You keep throwing up or have blood in your throw up.  You are very weak, have dry lips or a dry mouth, or you are very thirsty (dehydrated).  You have dark or bloody poop (stool).  You have very bad chest or belly (abdominal) pain.  You do not get better after 2 days, or you get worse.  You have a headache. MAKE SURE YOU:  Understand these instructions.  Will watch your condition.  Will get help right away if you are not doing well or get worse. Document Released: 08/09/2011 Document Revised: 11/12/2011 Document Reviewed: 08/09/2011 [facility] Patient Information 2015 [name], [place]. This information is not intended to replace advice given to you by your health care provider. Make sure you discuss any questions you have with your health care provider.  Musculoskeletal Pain Musculoskeletal pain is muscle and boney aches and pains. These pains can occur in any part of the body. Your caregiver may treat you without knowing the cause of the pain. They may treat you if blood or urine tests, X-rays, and other tests were normal.  CAUSES There is often not a definite cause or reason for these pains. These pains may be caused by a type of germ (virus). The discomfort may also come from overuse. Overuse includes working out too hard when your body is not fit. Boney aches also come from weather changes. Bone is sensitive to atmospheric pressure changes. HOME CARE INSTRUCTIONS   Ask when your test results will be ready. Make sure you get your test results.  Only take over-the-counter or prescription medicines for pain, discomfort, or fever as directed by your caregiver. If you were given medications for your  condition, do not drive, operate machinery or power tools, or sign legal documents for 24 hours. Do not drink alcohol. Do not take sleeping pills or other medications that may interfere with treatment.  Continue all activities unless the activities cause more pain. When the pain lessens, slowly resume normal activities. Gradually increase the intensity and duration of the activities or exercise.  During periods of severe pain, bed rest may be helpful. Lay or sit in any position that is comfortable.  Putting ice on the injured area.  Put ice in a bag.  Place a towel between your skin and the bag.  Leave the ice on for 15 to 20 minutes, 3 to 4 times a day.  Follow up with your caregiver for continued problems and no reason can be found for the pain. If the pain becomes worse or does not go away, it may be necessary to repeat tests  or do additional testing. Your caregiver may need to look further for a possible cause. SEEK IMMEDIATE MEDICAL CARE IF:  You have pain that is getting worse and is not relieved by medications.  You develop chest pain that is associated with shortness or breath, sweating, feeling sick to your stomach (nauseous), or throw up (vomit).  Your pain becomes localized to the abdomen.  You develop any new symptoms that seem different or that concern you. MAKE SURE YOU:   Understand these instructions.  Will watch your condition.  Will get help right away if you are not doing well or get worse. Document Released: 08/20/2005 Document Revised: 11/12/2011 Document Reviewed: 04/24/2013 [facility] Patient Information 2015 [place], [place]. This information is not intended to replace advice given to you by your health care provider. Make sure you discuss any questions you have with your health care provider.  Muscle Cramps and Spasms Muscle cramps and spasms occur when a muscle or muscles tighten and you have no control over this tightening (involuntary muscle contraction).  They are a common problem and can develop in any muscle. The most common place is in the calf muscles of the leg. Both muscle cramps and muscle spasms are involuntary muscle contractions, but they also have differences:   Muscle cramps are sporadic and painful. They may last a few seconds to a quarter of an hour. Muscle cramps are often more forceful and last longer than muscle spasms.  Muscle spasms may or may not be painful. They may also last just a few seconds or much longer. CAUSES  It is uncommon for cramps or spasms to be due to a serious underlying problem. In many cases, the cause of cramps or spasms is unknown. Some common causes are:   Overexertion.   Overuse from repetitive motions (doing the same thing over and over).   Remaining in a certain position for a long period of time.   Improper preparation, form, or technique while performing a sport or activity.   Dehydration.   Injury.   Side effects of some medicines.   Abnormally low levels of the salts and ions in your blood (electrolytes), especially potassium and calcium. This could happen if you are taking water pills (diuretics) or you are pregnant.  Some underlying medical problems can make it more likely to develop cramps or spasms. These include, but are not limited to:   Diabetes.   Parkinson disease.   Hormone disorders, such as thyroid problems.   Alcohol abuse.   Diseases specific to muscles, joints, and bones.   Blood vessel disease where not enough blood is getting to the muscles.  HOME CARE INSTRUCTIONS   Stay well hydrated. Drink enough water and fluids to keep your urine clear or pale yellow.  It may be helpful to massage, stretch, and relax the affected muscle.  For tight or tense muscles, use a warm towel, heating pad, or hot shower water directed to the affected area.  If you are sore or have pain after a cramp or spasm, applying ice to the affected area may relieve  discomfort.  Put ice in a plastic bag.  Place a towel between your skin and the bag.  Leave the ice on for 15-20 minutes, 03-04 times a day.  Medicines used to treat a known cause of cramps or spasms may help reduce their frequency or severity. Only take over-the-counter or prescription medicines as directed by your caregiver. SEEK MEDICAL CARE IF:  Your cramps or spasms get more  severe, more frequent, or do not improve over time.  MAKE SURE YOU:   Understand these instructions.  Will watch your condition.  Will get help right away if you are not doing well or get worse. Document Released: 02/09/2002 Document Revised: 12/15/2012 Document Reviewed: 08/06/2012 [facility] Patient Information 2015 [place], [place]. This information is not intended to replace advice given to you by your health care provider. Make sure you discuss any questions you have with your health care provider.
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
## 2014-10-28 ENCOUNTER — Other Ambulatory Visit: Payer: Self-pay | Admitting: Internal Medicine

## 2014-11-02 ENCOUNTER — Encounter: Payer: Self-pay | Admitting: Endocrinology

## 2014-11-09 ENCOUNTER — Other Ambulatory Visit: Payer: Self-pay | Admitting: Pharmacist Clinician (PhC)/ Clinical Pharmacy Specialist

## 2014-11-17 ENCOUNTER — Other Ambulatory Visit: Payer: Self-pay

## 2014-11-18 ENCOUNTER — Ambulatory Visit: Payer: Self-pay | Admitting: Internal Medicine

## 2014-11-24 ENCOUNTER — Other Ambulatory Visit: Payer: Self-pay | Admitting: Internal Medicine

## 2014-11-24 ENCOUNTER — Other Ambulatory Visit: Payer: Medicare Other

## 2014-11-24 ENCOUNTER — Ambulatory Visit (INDEPENDENT_AMBULATORY_CARE_PROVIDER_SITE_OTHER): Payer: Medicare Other | Admitting: Endocrinology

## 2014-11-24 ENCOUNTER — Encounter: Payer: Self-pay | Admitting: Endocrinology

## 2014-11-24 ENCOUNTER — Telehealth: Payer: Self-pay | Admitting: *Deleted

## 2014-11-24 VITALS — BP 128/88 | HR 96 | Temp 98.5°F | Ht 63.0 in | Wt 300.0 lb

## 2014-11-24 DIAGNOSIS — Z113 Encounter for screening for infections with a predominantly sexual mode of transmission: Secondary | ICD-10-CM | POA: Diagnosis not present

## 2014-11-24 DIAGNOSIS — E0821 Diabetes mellitus due to underlying condition with diabetic nephropathy: Secondary | ICD-10-CM

## 2014-11-24 DIAGNOSIS — E1142 Type 2 diabetes mellitus with diabetic polyneuropathy: Secondary | ICD-10-CM | POA: Diagnosis not present

## 2014-11-24 DIAGNOSIS — B20 Human immunodeficiency virus [HIV] disease: Secondary | ICD-10-CM

## 2014-11-24 LAB — CBC WITH DIFFERENTIAL/PLATELET
BASOS ABS: 0 10*3/uL (ref 0.0–0.1)
Basophils Relative: 0 % (ref 0–1)
Eosinophils Absolute: 0.4 10*3/uL (ref 0.0–0.7)
Eosinophils Relative: 7 % — ABNORMAL HIGH (ref 0–5)
HCT: 40.8 % (ref 36.0–46.0)
HEMOGLOBIN: 14 g/dL (ref 12.0–15.0)
LYMPHS ABS: 1.9 10*3/uL (ref 0.7–4.0)
LYMPHS PCT: 32 % (ref 12–46)
MCH: 30.4 pg (ref 26.0–34.0)
MCHC: 34.3 g/dL (ref 30.0–36.0)
MCV: 88.7 fL (ref 78.0–100.0)
MONOS PCT: 7 % (ref 3–12)
MPV: 8.6 fL (ref 8.6–12.4)
Monocytes Absolute: 0.4 10*3/uL (ref 0.1–1.0)
Neutro Abs: 3.2 10*3/uL (ref 1.7–7.7)
Neutrophils Relative %: 54 % (ref 43–77)
Platelets: 282 10*3/uL (ref 150–400)
RBC: 4.6 MIL/uL (ref 3.87–5.11)
RDW: 14.2 % (ref 11.5–15.5)
WBC: 6 10*3/uL (ref 4.0–10.5)

## 2014-11-24 LAB — HEMOGLOBIN A1C
HEMOGLOBIN A1C: 10.4 % — AB (ref <5.7)
MEAN PLASMA GLUCOSE: 252 mg/dL — AB (ref <117)

## 2014-11-24 LAB — COMPLETE METABOLIC PANEL WITH GFR
ALBUMIN: 3.6 g/dL (ref 3.5–5.2)
ALK PHOS: 101 U/L (ref 39–117)
ALT: 47 U/L — AB (ref 0–35)
AST: 33 U/L (ref 0–37)
BUN: 10 mg/dL (ref 6–23)
CO2: 22 mEq/L (ref 19–32)
Calcium: 9.1 mg/dL (ref 8.4–10.5)
Chloride: 103 mEq/L (ref 96–112)
Creat: 0.78 mg/dL (ref 0.50–1.10)
GFR, Est African American: >89 mL/min
GFR, Est Non African American: 88 mL/min
Glucose, Bld: 328 mg/dL — ABNORMAL HIGH (ref 70–99)
POTASSIUM: 4.2 meq/L (ref 3.5–5.3)
SODIUM: 136 meq/L (ref 135–145)
TOTAL PROTEIN: 6.3 g/dL (ref 6.0–8.3)
Total Bilirubin: 0.3 mg/dL (ref 0.2–1.2)

## 2014-11-24 LAB — RPR

## 2014-11-24 NOTE — Telephone Encounter (Signed)
Patient's case worker, Social research officer, government with [facility] spoke to patient's PCP and was told they were unable to get an A1C on her and knew she was coming here for labs and could this clinic order it. A1C added to today's labs. Susan [name]
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
## 2014-11-24 NOTE — Patient Instructions (Addendum)
It is really important to remember the insulin.  Try putting it next to something you do each morning blood tests are being requested for you today.  We'll contact you with results. Please come back for a follow-up appointment in 2 months. Please make a dietician appointment for the same day.   check your blood sugar twice a day.  vary the time of day when you check, between before the 3 meals, and at bedtime.  also check if you have symptoms of your blood sugar being too high or too low.  please keep a record of the readings and bring it to your next appointment here.  please call us sooner if your blood sugar goes below 70, or if you have a lot of readings over 200.

## 2014-11-25 LAB — T-HELPER CELL (CD4) - (RCID CLINIC ONLY)
CD4 % Helper T Cell: 26 % — ABNORMAL LOW (ref 33–55)
CD4 T Cell Abs: 540 /uL (ref 400–2700)

## 2014-11-26 ENCOUNTER — Other Ambulatory Visit: Payer: Self-pay | Admitting: Internal Medicine

## 2014-11-26 LAB — HIV-1 RNA QUANT-NO REFLEX-BLD: HIV 1 RNA Quant: <20 copies/mL (ref <20)

## 2014-12-01 LAB — HLA B*5701: HLA-B*5701 w/rflx HLA-B High: NEGATIVE

## 2014-12-14 ENCOUNTER — Ambulatory Visit: Payer: Medicare Other | Admitting: Podiatry

## 2014-12-23 ENCOUNTER — Other Ambulatory Visit: Payer: Self-pay | Admitting: Endocrinology

## 2015-01-08 ENCOUNTER — Other Ambulatory Visit: Payer: Self-pay | Admitting: Internal Medicine

## 2015-01-08 DIAGNOSIS — B20 Human immunodeficiency virus [HIV] disease: Secondary | ICD-10-CM

## 2015-01-13 ENCOUNTER — Encounter: Payer: Self-pay | Admitting: Internal Medicine

## 2015-01-13 ENCOUNTER — Ambulatory Visit (INDEPENDENT_AMBULATORY_CARE_PROVIDER_SITE_OTHER): Payer: Medicare Other | Admitting: Internal Medicine

## 2015-01-13 VITALS — BP 167/102 | HR 92 | Temp 98.3°F | Ht 63.0 in | Wt 283.0 lb

## 2015-01-13 DIAGNOSIS — B20 Human immunodeficiency virus [HIV] disease: Secondary | ICD-10-CM | POA: Diagnosis not present

## 2015-01-13 DIAGNOSIS — F258 Other schizoaffective disorders: Secondary | ICD-10-CM | POA: Diagnosis not present

## 2015-01-13 DIAGNOSIS — I10 Essential (primary) hypertension: Secondary | ICD-10-CM

## 2015-01-13 MED ORDER — QUETIAPINE FUMARATE 300 MG PO TABS: 300.0000 mg | ORAL_TABLET | Freq: Every day | ORAL | Status: DC

## 2015-01-13 MED ORDER — ELVITEG-COBIC-EMTRICIT-TENOFAF 150-150-200-10 MG PO TABS: 1.0000 | ORAL_TABLET | Freq: Every day | ORAL | Status: DC

## 2015-01-13 MED ORDER — QUETIAPINE FUMARATE 100 MG PO TABS: 100.0000 mg | ORAL_TABLET | Freq: Every day | ORAL | Status: DC

## 2015-01-13 NOTE — Assessment & Plan Note (Signed)
Got her an appt with her primary.

## 2015-01-13 NOTE — Progress Notes (Signed)
  Subjective:    Patient ID: Susan [name], female    DOB: October 26, 1960, 54 y.o.   MRN: [id number]  HPI She comes in for followup of her HIV. She continues on Stribild and remains undetectable with CD4 of 540.  Denies any missed doses.  Continues to smoke and trying to improve sugar management. Recently told that she can't continue with [facility].  Has not had seroquel refills.    Review of Systems  Constitutional: Negative for fatigue.  HENT: Negative for sore throat and trouble swallowing.   Gastrointestinal: Negative for diarrhea.  Musculoskeletal: Negative for myalgias.  Skin: Negative for rash.  Neurological: Negative for dizziness and headaches.       Objective:   Physical Exam  Constitutional: She appears well-developed and well-nourished. No distress.  HENT:  Mouth/Throat: Oropharynx is clear and moist. No oropharyngeal exudate.  Cardiovascular: Normal rate, regular rhythm and normal heart sounds.   No murmur heard. Pulmonary/Chest: Effort normal and breath sounds normal. No respiratory distress. She has no wheezes.  Lymphadenopathy:    She has no cervical adenopathy.          Assessment & Plan:

## 2015-01-13 NOTE — Assessment & Plan Note (Signed)
Will refill her seroquel.  She is going to find a new psychiatrist.

## 2015-01-13 NOTE — Assessment & Plan Note (Signed)
Doing good from this standpoint.  Will change her medicaiton to [facility] with her next refill due to less bone density and renal issues with TAF.  RTC 6 months.

## 2015-01-14 ENCOUNTER — Ambulatory Visit: Payer: Self-pay | Admitting: Internal Medicine

## 2015-01-14 DIAGNOSIS — R4689 Other symptoms and signs involving appearance and behavior: Secondary | ICD-10-CM | POA: Insufficient documentation

## 2015-01-24 ENCOUNTER — Encounter: Payer: Medicare Other | Attending: Endocrinology | Admitting: Nutrition

## 2015-01-24 ENCOUNTER — Encounter: Payer: Self-pay | Admitting: Endocrinology

## 2015-01-24 ENCOUNTER — Telehealth: Payer: Self-pay | Admitting: Endocrinology

## 2015-01-24 ENCOUNTER — Ambulatory Visit (INDEPENDENT_AMBULATORY_CARE_PROVIDER_SITE_OTHER): Payer: Medicare Other | Admitting: Endocrinology

## 2015-01-24 VITALS — BP 136/84 | HR 117 | Temp 98.2°F | Ht 63.0 in | Wt 305.0 lb

## 2015-01-24 DIAGNOSIS — Z713 Dietary counseling and surveillance: Secondary | ICD-10-CM | POA: Insufficient documentation

## 2015-01-24 DIAGNOSIS — E119 Type 2 diabetes mellitus without complications: Secondary | ICD-10-CM | POA: Diagnosis not present

## 2015-01-24 DIAGNOSIS — Z794 Long term (current) use of insulin: Secondary | ICD-10-CM | POA: Insufficient documentation

## 2015-01-24 DIAGNOSIS — E1142 Type 2 diabetes mellitus with diabetic polyneuropathy: Secondary | ICD-10-CM | POA: Diagnosis not present

## 2015-01-24 LAB — HEMOGLOBIN A1C: Hgb A1c MFr Bld: 9.6 % — ABNORMAL HIGH (ref 4.6–6.5)

## 2015-01-24 MED ORDER — INSULIN LISPRO PROT & LISPRO (75-25 MIX) 100 UNIT/ML KWIKPEN: 100.0000 [IU] | PEN_INJECTOR | Freq: Every day | SUBCUTANEOUS | Status: DC

## 2015-01-24 NOTE — Progress Notes (Signed)
Patient reports that she is testing her blood sugars acB and acS, and they are usually 130 or less. She is taking 100u of NPH BID-acS and acB. Bfast is just 3-4 soda crackers due to her nausea First meal is 1PM: usually a burger and small order of fries and regular Sprite.   Says blood sugars are usually 130s acS at 6PM.  Discussed the need to stop this regular soda if she wants to loose weight.  She agreed to do this.    Discussed the possibility that she may drop low if she stops this, and that she will need to reduce her AM dose of NPH insulin.  She was advised to call up her if her readings do drop acS.  She agreed to do this.  Supper is meat-mostly baked or grilled, with 2 veg.-1 starchy, and sprite or water to drink  Exercise:  Walking for 20-30 min. 2X/wk.  Encouraged her to do this 3X/wk to help with insulin sensitivity, and weight loss.  She agreed to do this.  She had no final questions.

## 2015-01-24 NOTE — Telephone Encounter (Signed)
I contacted the patient and advised that recent A1C was high. Patient notified Humalog 75/25 has been sent to her pharmacy. Patient voiced understanding.
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
## 2015-01-24 NOTE — Patient Instructions (Addendum)
blood tests are requested for you today.  We'll let you know about the results. Based on the results, we'll change to "70/30," which works more during the day, and less at night Please come back for a follow-up appointment in 2 months. Please make a dietician appointment for the same day.   check your blood sugar twice a day.  vary the time of day when you check, between before the 3 meals, and at bedtime.  also check if you have symptoms of your blood sugar being too high or too low.  please keep a record of the readings and bring it to your next appointment here.  please call us sooner if your blood sugar goes below 70, or if you have a lot of readings over 200.  It is really important to bring the blood sugar record, due to the recent episode of severely low blood sugar.

## 2015-01-24 NOTE — Telephone Encounter (Signed)
Patient is returning your call.  

## 2015-01-25 NOTE — Patient Instructions (Signed)
Stop drinking regular Sprite and switch to diet sprite of crystal light drinks Walk 3X/wk for 30 min.  Call if blood sugars drop below 70.

## 2015-01-28 ENCOUNTER — Encounter (HOSPITAL_COMMUNITY): Payer: Self-pay | Admitting: Emergency Medicine

## 2015-01-28 ENCOUNTER — Inpatient Hospital Stay (HOSPITAL_COMMUNITY)
Admission: EM | Admit: 2015-01-28 | Discharge: 2015-01-30 | DRG: 918 | Disposition: A | Discharge status: Other Acute Inpatient Hospital | Payer: Medicare Other | Attending: Internal Medicine | Admitting: Internal Medicine

## 2015-01-28 DIAGNOSIS — E785 Hyperlipidemia, unspecified: Secondary | ICD-10-CM | POA: Diagnosis present

## 2015-01-28 DIAGNOSIS — Z794 Long term (current) use of insulin: Secondary | ICD-10-CM | POA: Diagnosis not present

## 2015-01-28 DIAGNOSIS — F419 Anxiety disorder, unspecified: Secondary | ICD-10-CM | POA: Diagnosis present

## 2015-01-28 DIAGNOSIS — E119 Type 2 diabetes mellitus without complications: Secondary | ICD-10-CM | POA: Diagnosis not present

## 2015-01-28 DIAGNOSIS — Z915 Personal history of self-harm: Secondary | ICD-10-CM

## 2015-01-28 DIAGNOSIS — E780 Pure hypercholesterolemia: Secondary | ICD-10-CM | POA: Diagnosis present

## 2015-01-28 DIAGNOSIS — Z818 Family history of other mental and behavioral disorders: Secondary | ICD-10-CM

## 2015-01-28 DIAGNOSIS — E11649 Type 2 diabetes mellitus with hypoglycemia without coma: Secondary | ICD-10-CM | POA: Diagnosis present

## 2015-01-28 DIAGNOSIS — E162 Hypoglycemia, unspecified: Secondary | ICD-10-CM | POA: Diagnosis not present

## 2015-01-28 DIAGNOSIS — F251 Schizoaffective disorder, depressive type: Secondary | ICD-10-CM | POA: Diagnosis not present

## 2015-01-28 DIAGNOSIS — Z79899 Other long term (current) drug therapy: Secondary | ICD-10-CM | POA: Diagnosis not present

## 2015-01-28 DIAGNOSIS — R45851 Suicidal ideations: Secondary | ICD-10-CM | POA: Diagnosis not present

## 2015-01-28 DIAGNOSIS — Z6841 Body Mass Index (BMI) 40.0 and over, adult: Secondary | ICD-10-CM

## 2015-01-28 DIAGNOSIS — J45909 Unspecified asthma, uncomplicated: Secondary | ICD-10-CM | POA: Diagnosis not present

## 2015-01-28 DIAGNOSIS — I10 Essential (primary) hypertension: Secondary | ICD-10-CM | POA: Diagnosis not present

## 2015-01-28 DIAGNOSIS — E161 Other hypoglycemia: Secondary | ICD-10-CM | POA: Diagnosis not present

## 2015-01-28 DIAGNOSIS — Z21 Asymptomatic human immunodeficiency virus [HIV] infection status: Secondary | ICD-10-CM | POA: Diagnosis not present

## 2015-01-28 DIAGNOSIS — F332 Major depressive disorder, recurrent severe without psychotic features: Secondary | ICD-10-CM | POA: Diagnosis not present

## 2015-01-28 DIAGNOSIS — F411 Generalized anxiety disorder: Secondary | ICD-10-CM | POA: Diagnosis not present

## 2015-01-28 DIAGNOSIS — F259 Schizoaffective disorder, unspecified: Secondary | ICD-10-CM | POA: Diagnosis present

## 2015-01-28 DIAGNOSIS — M1990 Unspecified osteoarthritis, unspecified site: Secondary | ICD-10-CM | POA: Diagnosis not present

## 2015-01-28 DIAGNOSIS — Z813 Family history of other psychoactive substance abuse and dependence: Secondary | ICD-10-CM | POA: Diagnosis not present

## 2015-01-28 DIAGNOSIS — F258 Other schizoaffective disorders: Secondary | ICD-10-CM

## 2015-01-28 DIAGNOSIS — B20 Human immunodeficiency virus [HIV] disease: Secondary | ICD-10-CM | POA: Diagnosis not present

## 2015-01-28 DIAGNOSIS — Z23 Encounter for immunization: Secondary | ICD-10-CM | POA: Diagnosis not present

## 2015-01-28 DIAGNOSIS — Z8673 Personal history of transient ischemic attack (TIA), and cerebral infarction without residual deficits: Secondary | ICD-10-CM | POA: Diagnosis not present

## 2015-01-28 DIAGNOSIS — K219 Gastro-esophageal reflux disease without esophagitis: Secondary | ICD-10-CM | POA: Diagnosis present

## 2015-01-28 DIAGNOSIS — T1491 Suicide attempt: Secondary | ICD-10-CM

## 2015-01-28 DIAGNOSIS — F17210 Nicotine dependence, cigarettes, uncomplicated: Secondary | ICD-10-CM | POA: Diagnosis not present

## 2015-01-28 DIAGNOSIS — T383X2A Poisoning by insulin and oral hypoglycemic [antidiabetic] drugs, intentional self-harm, initial encounter: Secondary | ICD-10-CM | POA: Diagnosis not present

## 2015-01-28 DIAGNOSIS — R7309 Other abnormal glucose: Secondary | ICD-10-CM | POA: Diagnosis not present

## 2015-01-28 DIAGNOSIS — T1491XA Suicide attempt, initial encounter: Secondary | ICD-10-CM

## 2015-01-28 DIAGNOSIS — F250 Schizoaffective disorder, bipolar type: Secondary | ICD-10-CM | POA: Diagnosis present

## 2015-01-28 LAB — CBC
HEMATOCRIT: 38.4 % (ref 36.0–46.0)
HEMOGLOBIN: 13 g/dL (ref 12.0–15.0)
MCH: 31 pg (ref 26.0–34.0)
MCHC: 33.9 g/dL (ref 30.0–36.0)
MCV: 91.4 fL (ref 78.0–100.0)
PLATELETS: 235 10*3/uL (ref 150–400)
RBC: 4.2 MIL/uL (ref 3.87–5.11)
RDW: 14 % (ref 11.5–15.5)
WBC: 14 10*3/uL — ABNORMAL HIGH (ref 4.0–10.5)

## 2015-01-28 LAB — BASIC METABOLIC PANEL
ANION GAP: 10 (ref 5–15)
BUN: 6 mg/dL (ref 6–20)
CO2: 24 mmol/L (ref 22–32)
Calcium: 8.8 mg/dL — ABNORMAL LOW (ref 8.9–10.3)
Chloride: 107 mmol/L (ref 101–111)
Creatinine, Ser: 0.7 mg/dL (ref 0.44–1.00)
GFR calc Af Amer: >60 mL/min (ref >60)
GFR calc non Af Amer: >60 mL/min (ref >60)
Glucose, Bld: 62 mg/dL — ABNORMAL LOW (ref 65–99)
Potassium: 3.2 mmol/L — ABNORMAL LOW (ref 3.5–5.1)
Sodium: 141 mmol/L (ref 135–145)

## 2015-01-28 LAB — RAPID URINE DRUG SCREEN, HOSP PERFORMED
AMPHETAMINES: NOT DETECTED
BENZODIAZEPINES: NOT DETECTED
Barbiturates: NOT DETECTED
Cocaine: NOT DETECTED
Opiates: NOT DETECTED
TETRAHYDROCANNABINOL: POSITIVE — AB

## 2015-01-28 LAB — URINALYSIS, ROUTINE W REFLEX MICROSCOPIC
BILIRUBIN URINE: NEGATIVE
Glucose, UA: NEGATIVE mg/dL
HGB URINE DIPSTICK: NEGATIVE
KETONES UR: NEGATIVE mg/dL
Leukocytes, UA: NEGATIVE
Nitrite: NEGATIVE
PH: 7.5 (ref 5.0–8.0)
PROTEIN: NEGATIVE mg/dL
Specific Gravity, Urine: 1.006 (ref 1.005–1.030)
UROBILINOGEN UA: 0.2 mg/dL (ref 0.0–1.0)

## 2015-01-28 LAB — ETHANOL: Alcohol, Ethyl (B): <5 mg/dL (ref <5)

## 2015-01-28 LAB — CBG MONITORING, ED
GLUCOSE-CAPILLARY: 44 mg/dL — AB (ref 65–99)
Glucose-Capillary: 35 mg/dL — CL (ref 65–99)
Glucose-Capillary: 58 mg/dL — ABNORMAL LOW (ref 65–99)

## 2015-01-28 LAB — MRSA PCR SCREENING: MRSA BY PCR: POSITIVE — AB

## 2015-01-28 LAB — ACETAMINOPHEN LEVEL: Acetaminophen (Tylenol), Serum: <10 ug/mL — ABNORMAL LOW (ref 10–30)

## 2015-01-28 LAB — SALICYLATE LEVEL

## 2015-01-28 MED ORDER — CYCLOBENZAPRINE HCL 10 MG PO TABS
10.0000 mg | ORAL_TABLET | Freq: Three times a day (TID) | ORAL | Status: DC | PRN
  Administered 2015-01-29 (×2): 10 mg via ORAL
  Filled 2015-01-28 (×2): qty 1

## 2015-01-28 MED ORDER — SIMVASTATIN 40 MG PO TABS: 40.0000 mg | ORAL_TABLET | Freq: Every day | ORAL | Status: DC

## 2015-01-28 MED ORDER — SODIUM CHLORIDE 0.9 % IJ SOLN
3.0000 mL | Freq: Two times a day (BID) | INTRAMUSCULAR | Status: DC
  Administered 2015-01-28 – 2015-01-30 (×4): 3 mL via INTRAVENOUS

## 2015-01-28 MED ORDER — ACETAMINOPHEN 650 MG RE SUPP: 650.0000 mg | Freq: Four times a day (QID) | RECTAL | Status: DC | PRN

## 2015-01-28 MED ORDER — CHLORHEXIDINE GLUCONATE CLOTH 2 % EX PADS
6.0000 | MEDICATED_PAD | Freq: Every day | CUTANEOUS | Status: DC
  Administered 2015-01-29 – 2015-01-30 (×2): 6 via TOPICAL

## 2015-01-28 MED ORDER — INSULIN ASPART 100 UNIT/ML ~~LOC~~ SOLN: 0.0000 [IU] | Freq: Every day | SUBCUTANEOUS | Status: DC

## 2015-01-28 MED ORDER — MUPIROCIN 2 % EX OINT
1.0000 "application " | TOPICAL_OINTMENT | Freq: Two times a day (BID) | CUTANEOUS | Status: DC
  Administered 2015-01-28 – 2015-01-30 (×4): 1 via NASAL
  Filled 2015-01-28: qty 22

## 2015-01-28 MED ORDER — PANTOPRAZOLE SODIUM 40 MG PO TBEC: 40.0000 mg | DELAYED_RELEASE_TABLET | Freq: Every day | ORAL | Status: DC

## 2015-01-28 MED ORDER — ACETAMINOPHEN 325 MG PO TABS: 650.0000 mg | ORAL_TABLET | Freq: Four times a day (QID) | ORAL | Status: DC | PRN

## 2015-01-28 MED ORDER — GLUCAGON HCL RDNA (DIAGNOSTIC) 1 MG IJ SOLR
1.0000 mg | Freq: Once | INTRAMUSCULAR | Status: AC
Start: 2015-01-28 — End: 2015-01-28
  Administered 2015-01-28: 1 mg via INTRAVENOUS
  Filled 2015-01-28: qty 1

## 2015-01-28 MED ORDER — AMLODIPINE BESYLATE 5 MG PO TABS: 5.0000 mg | ORAL_TABLET | Freq: Every day | ORAL | Status: DC

## 2015-01-28 MED ORDER — ENALAPRIL MALEATE 20 MG PO TABS
20.0000 mg | ORAL_TABLET | Freq: Every day | ORAL | Status: DC
  Administered 2015-01-29 – 2015-01-30 (×2): 20 mg via ORAL
  Filled 2015-01-28 (×2): qty 1

## 2015-01-28 MED ORDER — ELVITEG-COBIC-EMTRICIT-TENOFAF 150-150-200-10 MG PO TABS: 1.0000 | ORAL_TABLET | Freq: Every day | ORAL | Status: DC

## 2015-01-28 MED ORDER — HYDRALAZINE HCL 20 MG/ML IJ SOLN
10.0000 mg | Freq: Once | INTRAMUSCULAR | Status: AC
  Administered 2015-01-29: 10 mg via INTRAVENOUS
  Filled 2015-01-28: qty 1

## 2015-01-28 MED ORDER — CLONIDINE HCL 0.1 MG PO TABS
0.2000 mg | ORAL_TABLET | Freq: Two times a day (BID) | ORAL | Status: DC
  Administered 2015-01-28 – 2015-01-30 (×4): 0.2 mg via ORAL
  Filled 2015-01-28 (×5): qty 2

## 2015-01-28 MED ORDER — INSULIN ASPART 100 UNIT/ML ~~LOC~~ SOLN
0.0000 [IU] | Freq: Three times a day (TID) | SUBCUTANEOUS | Status: DC
  Administered 2015-01-29 (×3): 2 [IU] via SUBCUTANEOUS
  Administered 2015-01-30: 3 [IU] via SUBCUTANEOUS

## 2015-01-28 MED ORDER — PRAVASTATIN SODIUM 20 MG PO TABS
80.0000 mg | ORAL_TABLET | Freq: Every day | ORAL | Status: DC
  Administered 2015-01-29: 80 mg via ORAL
  Filled 2015-01-28: qty 4

## 2015-01-28 MED ORDER — CITALOPRAM HYDROBROMIDE 20 MG PO TABS
20.0000 mg | ORAL_TABLET | Freq: Every day | ORAL | Status: DC
  Administered 2015-01-28: 20 mg via ORAL
  Filled 2015-01-28 (×2): qty 1

## 2015-01-28 MED ORDER — QUETIAPINE FUMARATE 100 MG PO TABS
300.0000 mg | ORAL_TABLET | Freq: Every day | ORAL | Status: DC
  Administered 2015-01-28 – 2015-01-29 (×2): 300 mg via ORAL
  Filled 2015-01-28 (×2): qty 3

## 2015-01-28 MED ORDER — DEXTROSE 50 % IV SOLN
1.0000 | Freq: Once | INTRAVENOUS | Status: AC
  Administered 2015-01-28: 50 mL via INTRAVENOUS
  Filled 2015-01-28: qty 50

## 2015-01-28 MED ORDER — PANTOPRAZOLE SODIUM 40 MG PO TBEC
40.0000 mg | DELAYED_RELEASE_TABLET | Freq: Every day | ORAL | Status: DC
  Administered 2015-01-28 – 2015-01-30 (×3): 40 mg via ORAL
  Filled 2015-01-28 (×3): qty 1

## 2015-01-28 MED ORDER — DEXTROSE 10 % IV SOLN
INTRAVENOUS | Status: DC
Start: 2015-01-28 — End: 2015-01-28
  Administered 2015-01-28: 17:00:00 via INTRAVENOUS
  Filled 2015-01-28: qty 1000

## 2015-01-28 MED ORDER — ONDANSETRON HCL 4 MG PO TABS: 4.0000 mg | ORAL_TABLET | Freq: Four times a day (QID) | ORAL | Status: DC | PRN

## 2015-01-28 MED ORDER — DEXTROSE 50 % IV SOLN
50.0000 mL | Freq: Once | INTRAVENOUS | Status: AC
  Administered 2015-01-28: 50 mL via INTRAVENOUS
  Filled 2015-01-28: qty 50

## 2015-01-28 MED ORDER — ELVITEG-COBIC-EMTRICIT-TENOFDF 150-150-200-300 MG PO TABS
1.0000 | ORAL_TABLET | Freq: Every day | ORAL | Status: DC
  Administered 2015-01-28 – 2015-01-29 (×2): 1 via ORAL
  Filled 2015-01-28 (×4): qty 1

## 2015-01-28 MED ORDER — ONDANSETRON HCL 4 MG/2ML IJ SOLN: 4.0000 mg | Freq: Four times a day (QID) | INTRAMUSCULAR | Status: DC | PRN

## 2015-01-28 MED ORDER — DEXTROSE 10 % IV SOLN
INTRAVENOUS | Status: DC
  Administered 2015-01-28: 100 mL via INTRAVENOUS
  Administered 2015-01-29: 100 mL/h via INTRAVENOUS

## 2015-01-28 MED ORDER — ATENOLOL 25 MG PO TABS
25.0000 mg | ORAL_TABLET | Freq: Every day | ORAL | Status: DC
  Administered 2015-01-28 – 2015-01-29 (×2): 25 mg via ORAL
  Filled 2015-01-28 (×3): qty 1

## 2015-01-28 MED ORDER — HEPARIN SODIUM (PORCINE) 5000 UNIT/ML IJ SOLN
5000.0000 [IU] | Freq: Three times a day (TID) | INTRAMUSCULAR | Status: DC
  Administered 2015-01-28 – 2015-01-30 (×6): 5000 [IU] via SUBCUTANEOUS
  Filled 2015-01-28 (×7): qty 1

## 2015-01-28 MED ORDER — QUETIAPINE FUMARATE 100 MG PO TABS
100.0000 mg | ORAL_TABLET | Freq: Every day | ORAL | Status: DC
  Administered 2015-01-29 – 2015-01-30 (×2): 100 mg via ORAL
  Filled 2015-01-28 (×2): qty 1

## 2015-01-28 MED ORDER — QUETIAPINE FUMARATE 100 MG PO TABS: 100.0000 mg | ORAL_TABLET | Freq: Every day | ORAL | Status: DC

## 2015-01-28 MED ORDER — PNEUMOCOCCAL VAC POLYVALENT 25 MCG/0.5ML IJ INJ
0.5000 mL | INJECTION | INTRAMUSCULAR | Status: AC
  Administered 2015-01-29: 0.5 mL via INTRAMUSCULAR
  Filled 2015-01-28 (×2): qty 0.5

## 2015-01-28 NOTE — ED Notes (Signed)
MD at bedside. 
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
## 2015-01-28 NOTE — ED Notes (Signed)
Per EMS-patient was found with CBG 20 mg/dl. Combative and laying in the floor. CBG 55 mg/dl. Pt given 1 mg Glucagon IM. Patient claims she is suicidal and tried to overdose on 160 units of Insulin. VS: 158/84 HR 72 RR 18.

## 2015-01-28 NOTE — ED Notes (Signed)
TTS order discontinued per TTS.  TTS informed this Charge RN that "she will be rounded on by Psych in the morning."

## 2015-01-28 NOTE — ED Notes (Signed)
CBG 44 mg/dl was taken when patient first arrived in ED.

## 2015-01-28 NOTE — ED Notes (Signed)
MD Mingo Amber notified CBG 35. Given D50 immediately and increased D10 gtt to 125 cc/hour per order. Given Orange Juice (118 cc x2) and graham crackers.

## 2015-01-28 NOTE — ED Notes (Signed)
Bed: WA06 Expected date:  Expected time:  Means of arrival:  Comments: EMS - hypoglycemic **Rm 6**
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
## 2015-01-28 NOTE — ED Notes (Signed)
MD Mingo Amber notified of CBG results. Patient given crackers, peanut butter and 118 cc Orange Juice. She is alert and oriented at this time.

## 2015-01-28 NOTE — ED Notes (Signed)
CBG 44 mg/dl. Orders placed for D50 IV.

## 2015-01-29 DIAGNOSIS — Z915 Personal history of self-harm: Secondary | ICD-10-CM | POA: Diagnosis not present

## 2015-01-29 DIAGNOSIS — R45851 Suicidal ideations: Secondary | ICD-10-CM

## 2015-01-29 DIAGNOSIS — F251 Schizoaffective disorder, depressive type: Secondary | ICD-10-CM

## 2015-01-29 DIAGNOSIS — Z23 Encounter for immunization: Secondary | ICD-10-CM | POA: Diagnosis not present

## 2015-01-29 DIAGNOSIS — K219 Gastro-esophageal reflux disease without esophagitis: Secondary | ICD-10-CM | POA: Diagnosis not present

## 2015-01-29 DIAGNOSIS — T383X2A Poisoning by insulin and oral hypoglycemic [antidiabetic] drugs, intentional self-harm, initial encounter: Principal | ICD-10-CM

## 2015-01-29 DIAGNOSIS — Z8673 Personal history of transient ischemic attack (TIA), and cerebral infarction without residual deficits: Secondary | ICD-10-CM | POA: Diagnosis not present

## 2015-01-29 DIAGNOSIS — E11649 Type 2 diabetes mellitus with hypoglycemia without coma: Secondary | ICD-10-CM | POA: Diagnosis not present

## 2015-01-29 DIAGNOSIS — I10 Essential (primary) hypertension: Secondary | ICD-10-CM | POA: Diagnosis not present

## 2015-01-29 DIAGNOSIS — Z6841 Body Mass Index (BMI) 40.0 and over, adult: Secondary | ICD-10-CM | POA: Diagnosis not present

## 2015-01-29 DIAGNOSIS — Z79899 Other long term (current) drug therapy: Secondary | ICD-10-CM | POA: Diagnosis not present

## 2015-01-29 DIAGNOSIS — M1990 Unspecified osteoarthritis, unspecified site: Secondary | ICD-10-CM | POA: Diagnosis not present

## 2015-01-29 DIAGNOSIS — F332 Major depressive disorder, recurrent severe without psychotic features: Secondary | ICD-10-CM | POA: Diagnosis not present

## 2015-01-29 DIAGNOSIS — J45909 Unspecified asthma, uncomplicated: Secondary | ICD-10-CM | POA: Diagnosis not present

## 2015-01-29 DIAGNOSIS — F17210 Nicotine dependence, cigarettes, uncomplicated: Secondary | ICD-10-CM | POA: Diagnosis not present

## 2015-01-29 DIAGNOSIS — E785 Hyperlipidemia, unspecified: Secondary | ICD-10-CM | POA: Diagnosis not present

## 2015-01-29 DIAGNOSIS — F419 Anxiety disorder, unspecified: Secondary | ICD-10-CM | POA: Diagnosis not present

## 2015-01-29 DIAGNOSIS — Z813 Family history of other psychoactive substance abuse and dependence: Secondary | ICD-10-CM | POA: Diagnosis not present

## 2015-01-29 DIAGNOSIS — Z818 Family history of other mental and behavioral disorders: Secondary | ICD-10-CM | POA: Diagnosis not present

## 2015-01-29 DIAGNOSIS — E780 Pure hypercholesterolemia: Secondary | ICD-10-CM | POA: Diagnosis not present

## 2015-01-29 DIAGNOSIS — Z794 Long term (current) use of insulin: Secondary | ICD-10-CM | POA: Diagnosis not present

## 2015-01-29 DIAGNOSIS — Z21 Asymptomatic human immunodeficiency virus [HIV] infection status: Secondary | ICD-10-CM | POA: Diagnosis not present

## 2015-01-29 DIAGNOSIS — F259 Schizoaffective disorder, unspecified: Secondary | ICD-10-CM | POA: Diagnosis not present

## 2015-01-29 LAB — CBC
HCT: 37.8 % (ref 36.0–46.0)
Hemoglobin: 12.3 g/dL (ref 12.0–15.0)
MCH: 29.6 pg (ref 26.0–34.0)
MCHC: 32.5 g/dL (ref 30.0–36.0)
MCV: 91.1 fL (ref 78.0–100.0)
Platelets: 219 10*3/uL (ref 150–400)
RBC: 4.15 MIL/uL (ref 3.87–5.11)
RDW: 14 % (ref 11.5–15.5)
WBC: 10.5 10*3/uL (ref 4.0–10.5)

## 2015-01-29 LAB — GLUCOSE, CAPILLARY
GLUCOSE-CAPILLARY: 109 mg/dL — AB (ref 65–99)
GLUCOSE-CAPILLARY: 110 mg/dL — AB (ref 65–99)
GLUCOSE-CAPILLARY: 118 mg/dL — AB (ref 65–99)
Glucose-Capillary: 87 mg/dL (ref 65–99)
Glucose-Capillary: 124 mg/dL — ABNORMAL HIGH (ref 65–99)
Glucose-Capillary: 148 mg/dL — ABNORMAL HIGH (ref 65–99)
Glucose-Capillary: 131 mg/dL — ABNORMAL HIGH (ref 65–99)
Glucose-Capillary: 81 mg/dL (ref 65–99)

## 2015-01-29 LAB — COMPREHENSIVE METABOLIC PANEL
ALT: 33 U/L (ref 14–54)
AST: 27 U/L (ref 15–41)
Albumin: 2.9 g/dL — ABNORMAL LOW (ref 3.5–5.0)
Alkaline Phosphatase: 88 U/L (ref 38–126)
Anion gap: 10 (ref 5–15)
BUN: 7 mg/dL (ref 6–20)
CHLORIDE: 104 mmol/L (ref 101–111)
CO2: 22 mmol/L (ref 22–32)
Calcium: 8.5 mg/dL — ABNORMAL LOW (ref 8.9–10.3)
Creatinine, Ser: 0.52 mg/dL (ref 0.44–1.00)
GFR calc Af Amer: >60 mL/min (ref >60)
GFR calc non Af Amer: >60 mL/min (ref >60)
GLUCOSE: 116 mg/dL — AB (ref 65–99)
POTASSIUM: 3.6 mmol/L (ref 3.5–5.1)
Sodium: 136 mmol/L (ref 135–145)
TOTAL PROTEIN: 6.3 g/dL — AB (ref 6.5–8.1)
Total Bilirubin: 0.5 mg/dL (ref 0.3–1.2)

## 2015-01-29 MED ORDER — INSULIN ASPART PROT & ASPART (70-30 MIX) 100 UNIT/ML ~~LOC~~ SUSP: 30.0000 [IU] | Freq: Two times a day (BID) | SUBCUTANEOUS | Status: DC

## 2015-01-29 MED ORDER — AMLODIPINE BESYLATE 10 MG PO TABS
10.0000 mg | ORAL_TABLET | Freq: Every day | ORAL | Status: DC
  Administered 2015-01-29 – 2015-01-30 (×2): 10 mg via ORAL
  Filled 2015-01-29 (×2): qty 1

## 2015-01-29 MED ORDER — INSULIN ASPART PROT & ASPART (70-30 MIX) 100 UNIT/ML ~~LOC~~ SUSP
30.0000 [IU] | Freq: Two times a day (BID) | SUBCUTANEOUS | Status: DC
  Administered 2015-01-29 (×2): 30 [IU] via SUBCUTANEOUS
  Filled 2015-01-29: qty 10

## 2015-01-29 MED ORDER — CITALOPRAM HYDROBROMIDE 20 MG PO TABS
40.0000 mg | ORAL_TABLET | Freq: Every day | ORAL | Status: DC
  Administered 2015-01-29: 40 mg via ORAL
  Filled 2015-01-29: qty 2

## 2015-01-29 MED ORDER — HYDRALAZINE HCL 20 MG/ML IJ SOLN
10.0000 mg | INTRAMUSCULAR | Status: DC | PRN
  Administered 2015-01-29: 10 mg via INTRAVENOUS
  Filled 2015-01-29: qty 1

## 2015-01-29 MED ORDER — GABAPENTIN 300 MG PO CAPS
300.0000 mg | ORAL_CAPSULE | Freq: Two times a day (BID) | ORAL | Status: DC
  Administered 2015-01-29 – 2015-01-30 (×2): 300 mg via ORAL
  Filled 2015-01-29 (×4): qty 1

## 2015-01-29 MED ORDER — HYDRALAZINE HCL 20 MG/ML IJ SOLN
5.0000 mg | INTRAMUSCULAR | Status: DC | PRN
  Administered 2015-01-29: 5 mg via INTRAVENOUS
  Filled 2015-01-29: qty 1

## 2015-01-29 NOTE — Progress Notes (Signed)
[facility] PROGRESS NOTE  TIANI STANBERY YTK:[id number] DOB: 06/03/1961 DOA: 01/28/2015 PCP: Gwendolyn Grant, MD  Assessment/Plan: 1. Suicide attempt 1. Pt was admitted with suicide precautions 2. Intentionally overdosed on insulin because "there was too much stress in my life. I just wanted to end it." 3. Psychiatry was consulted through ED 4. Glucose has remained stable, weaning dextrose IVF to off. Patient will be medically stable for transfer to inpatient psychiatry, if warranted, once her glucose remains stable off of dextrose IVF 2. DM 1. Insulin dependent 2. Glucose improved with dextrose IVF overnight, will wean to off 3. Will continue on SSI coverage with hypoglycemic protocol 4. Pt normally on 100 units of 75/25 5. For now, will continue on 70/30 insulin at 30units BID and titrate accordingly 3. HIV 1. On antivirals at home 2. Will continue. Stable 4. Schizoaffective d/o 1. Psychiatry consulted 2. Cont home meds for now with med changes deferred to Psychiatry 5. HLD 1. Will cont statin per home regimen 6. Depression 1. Per above, cont home meds for now 7. DVT prophylaxis 1. Heparin subQ  Code Status: Full Family Communication: Pt in room (indicate person spoken with, relationship, and if by phone, the number) Disposition Plan: Pending   Consultants:  Psychiatry  Procedures:    Antibiotics:   (indicate start date, and stop date if known)  HPI/Subjective: No complaints this AM  Objective: Filed Vitals:   01/29/15 0600 01/29/15 0700 01/29/15 0800 01/29/15 0900  BP: 137/93 150/86 147/107 162/95  Pulse: 67 65 64 69  Temp:      TempSrc:      Resp: 21 22 24 16   Height:      Weight:      SpO2: 99% 100% 100% 100%    Intake/Output Summary (Last 24 hours) at 01/29/15 0944 Last data filed at 01/29/15 0900  Gross per 24 hour  Intake   2343 ml  Output   1150 ml  Net   1193 ml   Filed Weights   01/28/15 2000 01/29/15 0500  Weight: 133.4 kg  (294 lb 1.5 oz) 132.7 kg (292 lb 8.8 oz)    Exam:   General:  Awake, in nad  Cardiovascular: regular, s1, s2  Respiratory: normal resp effort, no wheezing  Abdomen: soft,nondistended, obese  Musculoskeletal: perfused, no clubbing   Data Reviewed: Basic Metabolic Panel:  Recent Labs Lab 01/28/15 1547 01/29/15 0353  NA 141 136  K 3.2* 3.6  CL 107 104  CO2 24 22  GLUCOSE 62* 116*  BUN 6 7  CREATININE 0.70 0.52  CALCIUM 8.8* 8.5*   Liver Function Tests:  Recent Labs Lab 01/29/15 0353  AST 27  ALT 33  ALKPHOS 88  BILITOT 0.5  PROT 6.3*  ALBUMIN 2.9*   No results for input(s): LIPASE, AMYLASE in the last 168 hours. No results for input(s): AMMONIA in the last 168 hours. CBC:  Recent Labs Lab 01/28/15 1547 01/29/15 0353  WBC 14.0* 10.5  HGB 13.0 12.3  HCT 38.4 37.8  MCV 91.4 91.1  PLT 235 219   Cardiac Enzymes: No results for input(s): CKTOTAL, CKMB, CKMBINDEX, TROPONINI in the last 168 hours. BNP (last 3 results) No results for input(s): BNP in the last 8760 hours.  ProBNP (last 3 results) No results for input(s): PROBNP in the last 8760 hours.  CBG:  Recent Labs Lab 01/28/15 2023 01/28/15 2207 01/29/15 0001 01/29/15 0352 01/29/15 0752  GLUCAP 87 110* 109* 124* 148*    Recent Results (from the  past 240 hour(s))  MRSA PCR Screening     Status: Abnormal   Collection Time: 01/28/15  7:49 PM  Result Value Ref Range Status   MRSA by PCR POSITIVE (A) NEGATIVE Final    Comment:        The GeneXpert MRSA Assay (FDA approved for NASAL specimens only), is one component of a comprehensive MRSA colonization surveillance program. It is not intended to diagnose MRSA infection nor to guide or monitor treatment for MRSA infections. RESULT CALLED TO, READ BACK BY AND VERIFIED WITHDow Adolph [id number] @ 2126 BY J SCOTTON      Studies: No results found.  Scheduled Meds: . amLODipine  10 mg Oral Daily  . atenolol  25 mg Oral QHS  .  Chlorhexidine Gluconate Cloth  6 each Topical Q0600  . citalopram  20 mg Oral QHS  . cloNIDine  0.2 mg Oral BID  . elvitegravir-cobicistat-emtricitabine-tenofovir  1 tablet Oral Q supper  . enalapril  20 mg Oral Daily  . heparin  5,000 Units Subcutaneous 3 times per day  . insulin aspart  0-15 Units Subcutaneous TID WC  . insulin aspart  0-5 Units Subcutaneous QHS  . mupirocin ointment  1 application Nasal BID  . pantoprazole  40 mg Oral Daily  . pneumococcal 23 valent vaccine  0.5 mL Intramuscular Tomorrow-1000  . pravastatin  80 mg Oral q1800  . QUEtiapine  100 mg Oral Daily  . QUEtiapine  300 mg Oral QHS  . sodium chloride  3 mL Intravenous Q12H   Continuous Infusions: . dextrose 50 mL/hr at 01/29/15 0930    Principal Problem:   Suicide attempt Active Problems:   HIV disease   Schizoaffective disorder   Morbid obesity   Dyslipidemia   MDD (major depressive disorder), recurrent episode, severe   Diabetes mellitus type 2 without retinopathy   Hypoglycemia    [facility], [facility] Pager [phone]. If 7PM-7AM, please contact night-coverage at [email or web address], password [facility] 01/29/2015, 9:44 AM  LOS: 1 day
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
## 2015-01-30 ENCOUNTER — Encounter (HOSPITAL_COMMUNITY): Payer: Self-pay | Admitting: *Deleted

## 2015-01-30 ENCOUNTER — Inpatient Hospital Stay (HOSPITAL_COMMUNITY)
Admission: AD | Admit: 2015-01-30 | Discharge: 2015-02-08 | DRG: 885 | Disposition: A | Discharge status: Home / Self Care | Payer: 59 | Source: Intra-hospital | Attending: Psychiatry | Admitting: Psychiatry

## 2015-01-30 DIAGNOSIS — F411 Generalized anxiety disorder: Secondary | ICD-10-CM | POA: Diagnosis present

## 2015-01-30 DIAGNOSIS — Z8673 Personal history of transient ischemic attack (TIA), and cerebral infarction without residual deficits: Secondary | ICD-10-CM | POA: Diagnosis not present

## 2015-01-30 DIAGNOSIS — M1990 Unspecified osteoarthritis, unspecified site: Secondary | ICD-10-CM | POA: Diagnosis present

## 2015-01-30 DIAGNOSIS — G4700 Insomnia, unspecified: Secondary | ICD-10-CM | POA: Diagnosis present

## 2015-01-30 DIAGNOSIS — E780 Pure hypercholesterolemia: Secondary | ICD-10-CM | POA: Diagnosis present

## 2015-01-30 DIAGNOSIS — E119 Type 2 diabetes mellitus without complications: Secondary | ICD-10-CM | POA: Diagnosis present

## 2015-01-30 DIAGNOSIS — F419 Anxiety disorder, unspecified: Secondary | ICD-10-CM | POA: Diagnosis present

## 2015-01-30 DIAGNOSIS — F17210 Nicotine dependence, cigarettes, uncomplicated: Secondary | ICD-10-CM | POA: Diagnosis present

## 2015-01-30 DIAGNOSIS — K219 Gastro-esophageal reflux disease without esophagitis: Secondary | ICD-10-CM | POA: Diagnosis present

## 2015-01-30 DIAGNOSIS — F332 Major depressive disorder, recurrent severe without psychotic features: Secondary | ICD-10-CM | POA: Diagnosis present

## 2015-01-30 DIAGNOSIS — F333 Major depressive disorder, recurrent, severe with psychotic symptoms: Secondary | ICD-10-CM

## 2015-01-30 DIAGNOSIS — B20 Human immunodeficiency virus [HIV] disease: Secondary | ICD-10-CM | POA: Diagnosis present

## 2015-01-30 DIAGNOSIS — T1491 Suicide attempt: Secondary | ICD-10-CM | POA: Diagnosis not present

## 2015-01-30 DIAGNOSIS — Z794 Long term (current) use of insulin: Secondary | ICD-10-CM | POA: Diagnosis not present

## 2015-01-30 DIAGNOSIS — E785 Hyperlipidemia, unspecified: Secondary | ICD-10-CM | POA: Diagnosis present

## 2015-01-30 DIAGNOSIS — E162 Hypoglycemia, unspecified: Secondary | ICD-10-CM | POA: Diagnosis not present

## 2015-01-30 DIAGNOSIS — T383X2A Poisoning by insulin and oral hypoglycemic [antidiabetic] drugs, intentional self-harm, initial encounter: Secondary | ICD-10-CM | POA: Diagnosis not present

## 2015-01-30 DIAGNOSIS — R45851 Suicidal ideations: Secondary | ICD-10-CM | POA: Diagnosis present

## 2015-01-30 DIAGNOSIS — F251 Schizoaffective disorder, depressive type: Secondary | ICD-10-CM | POA: Diagnosis not present

## 2015-01-30 DIAGNOSIS — I10 Essential (primary) hypertension: Secondary | ICD-10-CM | POA: Diagnosis present

## 2015-01-30 DIAGNOSIS — J45909 Unspecified asthma, uncomplicated: Secondary | ICD-10-CM | POA: Diagnosis present

## 2015-01-30 HISTORY — DX: Anxiety disorder, unspecified: F41.9

## 2015-01-30 LAB — BASIC METABOLIC PANEL
Anion gap: 9 (ref 5–15)
BUN: 6 mg/dL (ref 6–20)
CHLORIDE: 107 mmol/L (ref 101–111)
CO2: 22 mmol/L (ref 22–32)
CREATININE: 0.68 mg/dL (ref 0.44–1.00)
Calcium: 8.5 mg/dL — ABNORMAL LOW (ref 8.9–10.3)
GFR calc Af Amer: >60 mL/min (ref >60)
Glucose, Bld: 76 mg/dL (ref 65–99)
Potassium: 3.8 mmol/L (ref 3.5–5.1)
SODIUM: 138 mmol/L (ref 135–145)

## 2015-01-30 LAB — GLUCOSE, CAPILLARY
GLUCOSE-CAPILLARY: 76 mg/dL (ref 65–99)
Glucose-Capillary: 157 mg/dL — ABNORMAL HIGH (ref 65–99)
Glucose-Capillary: 120 mg/dL — ABNORMAL HIGH (ref 65–99)

## 2015-01-30 MED ORDER — INSULIN ASPART PROT & ASPART (70-30 MIX) 100 UNIT/ML ~~LOC~~ SUSP
20.0000 [IU] | Freq: Two times a day (BID) | SUBCUTANEOUS | Status: DC
  Administered 2015-01-31 – 2015-02-08 (×17): 20 [IU] via SUBCUTANEOUS

## 2015-01-30 MED ORDER — ELVITEG-COBIC-EMTRICIT-TENOFAF 150-150-200-10 MG PO TABS
1.0000 | ORAL_TABLET | Freq: Every day | ORAL | Status: DC
  Administered 2015-01-30 – 2015-02-07 (×8): 1 via ORAL
  Filled 2015-01-30 (×12): qty 1

## 2015-01-30 MED ORDER — GABAPENTIN 300 MG PO CAPS
300.0000 mg | ORAL_CAPSULE | Freq: Two times a day (BID) | ORAL | Status: DC
  Administered 2015-01-30 – 2015-02-08 (×18): 300 mg via ORAL
  Filled 2015-01-30: qty 1
  Filled 2015-01-30: qty 8
  Filled 2015-01-30 (×21): qty 1
  Filled 2015-01-30: qty 8

## 2015-01-30 MED ORDER — INSULIN ASPART PROT & ASPART (70-30 MIX) 100 UNIT/ML ~~LOC~~ SUSP: 20.0000 [IU] | Freq: Two times a day (BID) | SUBCUTANEOUS | Status: DC

## 2015-01-30 MED ORDER — SIMVASTATIN 40 MG PO TABS: 40.0000 mg | ORAL_TABLET | Freq: Every day | ORAL | Status: DC

## 2015-01-30 MED ORDER — QUETIAPINE FUMARATE 100 MG PO TABS
100.0000 mg | ORAL_TABLET | Freq: Every day | ORAL | Status: DC
  Administered 2015-01-31 – 2015-02-08 (×9): 100 mg via ORAL
  Filled 2015-01-30 (×8): qty 1
  Filled 2015-01-30: qty 12
  Filled 2015-01-30 (×3): qty 1

## 2015-01-30 MED ORDER — ALUM & MAG HYDROXIDE-SIMETH 200-200-20 MG/5ML PO SUSP: 30.0000 mL | ORAL | Status: DC | PRN

## 2015-01-30 MED ORDER — ENALAPRIL MALEATE 5 MG PO TABS
20.0000 mg | ORAL_TABLET | Freq: Every day | ORAL | Status: DC
  Administered 2015-02-01 – 2015-02-08 (×8): 20 mg via ORAL
  Filled 2015-01-30: qty 2
  Filled 2015-01-30: qty 1
  Filled 2015-01-30: qty 2
  Filled 2015-01-30 (×2): qty 1
  Filled 2015-01-30: qty 4
  Filled 2015-01-30 (×3): qty 1
  Filled 2015-01-30: qty 4
  Filled 2015-01-30 (×2): qty 1
  Filled 2015-01-30: qty 4

## 2015-01-30 MED ORDER — INSULIN ASPART PROT & ASPART (70-30 MIX) 100 UNIT/ML ~~LOC~~ SUSP
28.0000 [IU] | Freq: Two times a day (BID) | SUBCUTANEOUS | Status: DC
  Filled 2015-01-30: qty 10

## 2015-01-30 MED ORDER — CITALOPRAM HYDROBROMIDE 40 MG PO TABS
40.0000 mg | ORAL_TABLET | Freq: Every day | ORAL | Status: DC
  Administered 2015-01-30 – 2015-02-07 (×9): 40 mg via ORAL
  Filled 2015-01-30 (×8): qty 1
  Filled 2015-01-30: qty 4
  Filled 2015-01-30: qty 2
  Filled 2015-01-30 (×2): qty 1

## 2015-01-30 MED ORDER — INSULIN ASPART PROT & ASPART (70-30 MIX) 100 UNIT/ML ~~LOC~~ SUSP
15.0000 [IU] | Freq: Once | SUBCUTANEOUS | Status: AC
  Administered 2015-01-30: 15 [IU] via SUBCUTANEOUS

## 2015-01-30 MED ORDER — PANTOPRAZOLE SODIUM 40 MG PO TBEC
40.0000 mg | DELAYED_RELEASE_TABLET | Freq: Every day | ORAL | Status: DC
  Administered 2015-01-31 – 2015-02-08 (×9): 40 mg via ORAL
  Filled 2015-01-30 (×12): qty 1

## 2015-01-30 MED ORDER — ATORVASTATIN CALCIUM 20 MG PO TABS
20.0000 mg | ORAL_TABLET | Freq: Every day | ORAL | Status: DC
  Administered 2015-01-31 – 2015-02-07 (×8): 20 mg via ORAL
  Filled 2015-01-30 (×9): qty 1
  Filled 2015-01-30: qty 2
  Filled 2015-01-30: qty 1

## 2015-01-30 MED ORDER — AMLODIPINE BESYLATE 10 MG PO TABS: 10.0000 mg | ORAL_TABLET | Freq: Every day | ORAL | Status: DC

## 2015-01-30 MED ORDER — CITALOPRAM HYDROBROMIDE 40 MG PO TABS: 40.0000 mg | ORAL_TABLET | Freq: Every day | ORAL | Status: DC

## 2015-01-30 MED ORDER — GABAPENTIN 300 MG PO CAPS: 300.0000 mg | ORAL_CAPSULE | Freq: Two times a day (BID) | ORAL | Status: DC

## 2015-01-30 MED ORDER — INSULIN ASPART PROT & ASPART (70-30 MIX) 100 UNIT/ML ~~LOC~~ SUSP: 10.0000 [IU] | Freq: Once | SUBCUTANEOUS | Status: DC

## 2015-01-30 MED ORDER — ATENOLOL 25 MG PO TABS
25.0000 mg | ORAL_TABLET | Freq: Every day | ORAL | Status: DC
  Administered 2015-01-30 – 2015-02-07 (×9): 25 mg via ORAL
  Filled 2015-01-30 (×12): qty 1

## 2015-01-30 MED ORDER — ACETAMINOPHEN 325 MG PO TABS
650.0000 mg | ORAL_TABLET | Freq: Four times a day (QID) | ORAL | Status: DC | PRN
  Filled 2015-01-30: qty 2

## 2015-01-30 MED ORDER — FLUTICASONE PROPIONATE 50 MCG/ACT NA SUSP: 2.0000 | Freq: Every day | NASAL | Status: DC | PRN

## 2015-01-30 MED ORDER — QUETIAPINE FUMARATE 300 MG PO TABS
300.0000 mg | ORAL_TABLET | Freq: Every day | ORAL | Status: DC
  Administered 2015-01-30 – 2015-02-07 (×9): 300 mg via ORAL
  Filled 2015-01-30 (×12): qty 1

## 2015-01-30 MED ORDER — ELVITEG-COBIC-EMTRICIT-TENOFAF 150-150-200-10 MG PO TABS
1.0000 | ORAL_TABLET | Freq: Every day | ORAL | Status: DC
  Filled 2015-01-30: qty 1

## 2015-01-30 MED ORDER — MAGNESIUM HYDROXIDE 400 MG/5ML PO SUSP: 30.0000 mL | Freq: Every day | ORAL | Status: DC | PRN

## 2015-01-30 MED ORDER — CLONIDINE HCL 0.2 MG PO TABS
0.2000 mg | ORAL_TABLET | Freq: Two times a day (BID) | ORAL | Status: DC
  Administered 2015-01-30 – 2015-02-08 (×17): 0.2 mg via ORAL
  Filled 2015-01-30 (×6): qty 1
  Filled 2015-01-30: qty 2
  Filled 2015-01-30 (×16): qty 1
  Filled 2015-01-30: qty 2

## 2015-01-30 MED ORDER — HYDROXYZINE HCL 25 MG PO TABS
25.0000 mg | ORAL_TABLET | Freq: Three times a day (TID) | ORAL | Status: DC | PRN
  Administered 2015-02-01: 25 mg via ORAL
  Filled 2015-01-30: qty 1
  Filled 2015-01-30: qty 10

## 2015-01-30 MED ORDER — LORATADINE 10 MG PO TABS
10.0000 mg | ORAL_TABLET | Freq: Every day | ORAL | Status: DC
  Administered 2015-01-31 – 2015-02-08 (×9): 10 mg via ORAL
  Filled 2015-01-30 (×12): qty 1

## 2015-01-30 MED ORDER — AMLODIPINE BESYLATE 10 MG PO TABS
10.0000 mg | ORAL_TABLET | Freq: Every day | ORAL | Status: DC
  Administered 2015-02-01 – 2015-02-08 (×8): 10 mg via ORAL
  Filled 2015-01-30 (×2): qty 1
  Filled 2015-01-30: qty 2
  Filled 2015-01-30 (×9): qty 1

## 2015-01-30 NOTE — Progress Notes (Signed)
Pt attended group this evening. 

## 2015-01-30 NOTE — Discharge Summary (Signed)
Physician Discharge Summary  Susan [name] QIO:[id number] DOB: 01/08/1961 DOA: 01/28/2015  PCP: Gwendolyn Grant, MD  Admit date: 01/28/2015 Discharge date: 01/30/2015  Time spent: 20 minutes  Patient Transferred to Inpatient Psychiatry  Recommendations for Outpatient Follow-up:  1. Ultimately follow up with PCP and psychiatrist on discharge  Discharge Diagnoses:  Principal Problem:   Suicide attempt Active Problems:   HIV disease   Schizoaffective disorder   Morbid obesity   Dyslipidemia   MDD (major depressive disorder), recurrent episode, severe   Diabetes mellitus type 2 without retinopathy   Hypoglycemia   Discharge Condition: Improved  Diet recommendation: Diabetic  Filed Weights   01/28/15 2000 01/29/15 0500  Weight: 133.4 kg (294 lb 1.5 oz) 132.7 kg (292 lb 8.8 oz)    History of present illness:  Please see admit h and p from 5/27 for details. Briefly, pt presented following suicide attempt by overdosing on insulin. Pt was admitted for further work up.  Hospital Course:  1. Suicide attempt 1. Pt was admitted with suicide precautions after Intentionally overdosing on insulin because "there was too much stress in my life. I just wanted to end it." 2. Psychiatry was consulted through ED and has recommended inpatient psychiatry 3. Glucose has remained stable, weaned dextrose IVF to off. 4. Patient is medically cleared for inpatient psychiatry 2. DM 1. Insulin dependent 2. Will continue on SSI coverage with hypoglycemic protocol 3. Pt normally on 100 units of 75/25 prior to admit 4. Continued on 70/30 insulin at 30units BID. Glucose borderline normal/low at 76. Will decrease insulin to 20 units BID and would cont to fine-tune and titrate accordingly 3. HIV 1. On antivirals at home 2. Will continue. Stable 4. Schizoaffective d/o 1. Psychiatry consulted 2. Cont home meds for now with med changes deferred to Psychiatry 5. HLD 1. Will cont statin per home  regimen 6. Depression 1. Per above, cont home meds for now 7. DVT prophylaxis 1. Heparin subQ  Consultations:  Psychiatry  Discharge Exam: Filed Vitals:   01/30/15 1200 01/30/15 1300 01/30/15 1400 01/30/15 1500  BP: 143/71     Pulse: 73 79 74 74  Temp:      TempSrc:      Resp: 25 23 22 28   Height:      Weight:      SpO2: 100% 100% 100% 100%    General: awake, in nad Cardiovascular: regular, s1, s2 Respiratory: normal resp effort, no wheezing  Discharge Instructions     Medication List    STOP taking these medications        elvitegravir-cobicistat-emtricitabine-tenofovir 150-150-200-10 MG Tabs tablet  Commonly known as:  GENVOYA     Insulin Lispro Prot & Lispro (75-25) 100 UNIT/ML Kwikpen  Commonly known as:  HUMALOG MIX 75/25 KWIKPEN      TAKE these medications        amLODipine 10 MG tablet  Commonly known as:  NORVASC  Take 1 tablet (10 mg total) by mouth daily.     atenolol 25 MG tablet  Commonly known as:  TENORMIN  Take 1 tablet (25 mg total) by mouth at bedtime.     B-D ULTRAFINE III SHORT PEN 31G X 8 MM Misc  Generic drug:  Insulin Pen Needle  USE AS DIRECTED DAILY     cetirizine 10 MG tablet  Commonly known as:  ZYRTEC  Take 1 tablet (10 mg total) by mouth daily.     citalopram 40 MG tablet  Commonly known as:  CELEXA  Take 1 tablet (40 mg total) by mouth at bedtime.     cloNIDine 0.2 MG tablet  Commonly known as:  CATAPRES  Take 1 tablet (0.2 mg total) by mouth 2 (two) times daily.     cyclobenzaprine 10 MG tablet  Commonly known as:  FLEXERIL  Take 1 tablet (10 mg total) by mouth 3 (three) times daily as needed for muscle spasms.     enalapril 20 MG tablet  Commonly known as:  VASOTEC  Take 1 tablet (20 mg total) by mouth daily.     eucerin cream  Apply topically as needed for wound care.     fluticasone 50 MCG/ACT nasal spray  Commonly known as:  FLONASE  Place 2 sprays into both nostrils daily.     gabapentin 300 MG  capsule  Commonly known as:  NEURONTIN  Take 1 capsule (300 mg total) by mouth 2 (two) times daily.     glucose blood test strip  Commonly known as:  ONETOUCH VERIO  1 each by Other route 2 (two) times daily. And lancets 2/day 250.01     insulin aspart protamine- aspart (70-30) 100 UNIT/ML injection  Commonly known as:  NOVOLOG MIX 70/30  Inject 0.2 mLs (20 Units total) into the skin 2 (two) times daily with a meal.     omeprazole 20 MG capsule  Commonly known as:  PRILOSEC  Take 1 capsule (20 mg total) by mouth daily.     QUEtiapine 300 MG tablet  Commonly known as:  SEROQUEL  Take 1 tablet (300 mg total) by mouth at bedtime. Mental health     QUEtiapine 100 MG tablet  Commonly known as:  SEROQUEL  Take 1 tablet (100 mg total) by mouth daily. Mental health     simvastatin 40 MG tablet  Commonly known as:  ZOCOR  Take 1 tablet (40 mg total) by mouth daily.     STRIBILD 150-150-200-300 MG Tabs tablet  Generic drug:  elvitegravir-cobicistat-emtricitabine-tenofovir  Take 1 tablet by mouth at bedtime.       No Known Allergies Follow-up Information    Schedule an appointment as soon as possible for a visit with Gwendolyn Grant, MD.   Specialty:  Internal Medicine   Why:  Hospital follow up   Contact information:   [address] [place] [address] [phone]        The results of significant diagnostics from this hospitalization (including imaging, microbiology, ancillary and laboratory) are listed below for reference.    Significant Diagnostic Studies: No results found.  Microbiology: Recent Results (from the past 240 hour(s))  MRSA PCR Screening     Status: Abnormal   Collection Time: 01/28/15  7:49 PM  Result Value Ref Range Status   MRSA by PCR POSITIVE (A) NEGATIVE Final    Comment:        The GeneXpert MRSA Assay (FDA approved for NASAL specimens only), is one component of a comprehensive MRSA colonization surveillance  program. It is not intended to diagnose MRSA infection nor to guide or monitor treatment for MRSA infections. RESULT CALLED TO, READ BACK BY AND VERIFIED WITHDow [name] [phone] @ 2126 BY J SCOTTON      Labs: Basic Metabolic Panel:  Recent Labs Lab 01/28/15 1547 01/29/15 0353 01/30/15 0420  NA 141 136 138  K 3.2* 3.6 3.8  CL 107 104 107  CO2 24 22 22   GLUCOSE 62* 116* 76  BUN 6 7 6  CREATININE 0.70 0.52 0.68  CALCIUM 8.8* 8.5* 8.5*   Liver Function Tests:  Recent Labs Lab 01/29/15 0353  AST 27  ALT 33  ALKPHOS 88  BILITOT 0.5  PROT 6.3*  ALBUMIN 2.9*   No results for input(s): LIPASE, AMYLASE in the last 168 hours. No results for input(s): AMMONIA in the last 168 hours. CBC:  Recent Labs Lab 01/28/15 1547 01/29/15 0353  WBC 14.0* 10.5  HGB 13.0 12.3  HCT 38.4 37.8  MCV 91.4 91.1  PLT 235 219   Cardiac Enzymes: No results for input(s): CKTOTAL, CKMB, CKMBINDEX, TROPONINI in the last 168 hours. BNP: BNP (last 3 results) No results for input(s): BNP in the last 8760 hours.  ProBNP (last 3 results) No results for input(s): PROBNP in the last 8760 hours.  CBG:  Recent Labs Lab 01/29/15 1138 01/29/15 1647 01/29/15 2157 01/30/15 0738 01/30/15 1158  GLUCAP 131* 118* 81 76 157*    Signed:  Nashia Remus K  [facility] 01/30/2015, 3:28 PM
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
## 2015-01-30 NOTE — Clinical Social Work Note (Signed)
CSW received a call from [facility] stating that psychiatrist had made a referral for inpatient and they had a bed available today.  CSW met with pt and explained the consent form prompting pt to sign the form  CSW faxed the consent form to [facility] at [phone].  [facility] can accept pt after 4pm today and CSW was asked to Call [facility] for transport  .Dede Query, LCSW [facility] Clinical Social Worker - Weekend Coverage cell #: [phone]

## 2015-01-30 NOTE — Tx Team (Signed)
Initial Interdisciplinary Treatment Plan   PATIENT STRESSORS: Financial difficulties Health problems Medication change or noncompliance Substance abuse   PATIENT STRENGTHS: Ability for insight Average or above average intelligence Capable of independent living Communication skills Motivation for treatment/growth Supportive family/friends   PROBLEM LIST: Problem List/Patient Goals Date to be addressed Date deferred Reason deferred Estimated date of resolution  "suicidal thoughts" 09/01/2015   D/c  "THC" 09/01/2015   D/c  "anxiety" 09/01/2015   D/c  "depression" 09/01/2015   D/c                                 DISCHARGE CRITERIA:  Ability to meet basic life and health needs Adequate post-discharge living arrangements Improved stabilization in mood, thinking, and/or behavior Medical problems require only outpatient monitoring Motivation to continue treatment in a less acute level of care Need for constant or close observation no longer present Reduction of life-threatening or endangering symptoms to within safe limits Safe-care adequate arrangements made Verbal commitment to aftercare and medication compliance Withdrawal symptoms are absent or subacute and managed without 24-hour nursing intervention  PRELIMINARY DISCHARGE PLAN: Attend aftercare/continuing care group Attend PHP/IOP Attend 12-step recovery group Outpatient therapy Participate in family therapy Return to previous living arrangement  PATIENT/FAMIILY INVOLVEMENT: This treatment plan has been presented to and reviewed with the patient, [name].  The patient and family have been given the opportunity to ask questions and make suggestions.  Cammy Copa 01/30/2015, 6:49 PM

## 2015-01-30 NOTE — Plan of Care (Signed)
Problem: Consults Goal: Suicide Risk Patient Education (See Patient Education module for education specifics)  Outcome: Completed/Met Date Met:  01/30/15 Patient discussed suicidal thoughts/coping skills with patient.

## 2015-01-30 NOTE — Progress Notes (Addendum)
[facility] PROGRESS NOTE  Susan [name] HQP:[id number] DOB: 1961-04-08 DOA: 01/28/2015 PCP: Susan Grant, MD  Assessment/Plan: 1. Suicide attempt 1. Pt was admitted with suicide precautions after Intentionally overdosing on insulin because "there was too much stress in my life. I just wanted to end it." 2. Psychiatry was consulted through ED and has recommended inpatient psychiatry 3. Glucose has remained stable, weaned dextrose IVF to off. 4. Patient is medically cleared for inpatient psychiatry 2. DM 1. Insulin dependent 2. Will continue on SSI coverage with hypoglycemic protocol 3. Pt normally on 100 units of 75/25 prior to admit 4. Continued on 70/30 insulin at 30units BID. Glucose borderline normal/low at 76. Will decrease insulin to 20 units BID and cont to fine-tune and titrate accordingly 3. HIV 1. On antivirals at home 2. Will continue. Stable 4. Schizoaffective d/o 1. Psychiatry consulted 2. Cont home meds for now with med changes deferred to Psychiatry 5. HLD 1. Will cont statin per home regimen 6. Depression 1. Per above, cont home meds for now 7. DVT prophylaxis 1. Heparin subQ  Code Status: Full Family Communication: Pt in room Disposition Plan: Pending transfer to inpatient psychiatry - patient is medically cleared   Consultants:  Psychiatry  Procedures:  none  Antibiotics:   none  HPI/Subjective: No complaints today. No chest pain or sob  Objective: Filed Vitals:   01/30/15 0400 01/30/15 0700 01/30/15 0745 01/30/15 0800  BP: 126/77   169/82  Pulse: 71 75  72  Temp:   98.4 F (36.9 C)   TempSrc:   Oral   Resp: 19 23  13   Height:      Weight:      SpO2: 100% 98%  100%    Intake/Output Summary (Last 24 hours) at 01/30/15 0948 Last data filed at 01/30/15 0600  Gross per 24 hour  Intake   1200 ml  Output   3950 ml  Net  -2750 ml   Filed Weights   01/28/15 2000 01/29/15 0500  Weight: 133.4 kg (294 lb 1.5 oz) 132.7 kg (292 lb  8.8 oz)    Exam:   General:  Awake, sitting in bed, eating breakfast, in nad  Cardiovascular: regular, s1, s2  Respiratory: normal resp effort, no wheezing, no crackles  Abdomen: soft,nondistended, obese, pos BS  Musculoskeletal: perfused, no clubbing   Data Reviewed: Basic Metabolic Panel:  Recent Labs Lab 01/28/15 1547 01/29/15 0353 01/30/15 0420  NA 141 136 138  K 3.2* 3.6 3.8  CL 107 104 107  CO2 24 22 22   GLUCOSE 62* 116* 76  BUN 6 7 6   CREATININE 0.70 0.52 0.68  CALCIUM 8.8* 8.5* 8.5*   Liver Function Tests:  Recent Labs Lab 01/29/15 0353  AST 27  ALT 33  ALKPHOS 88  BILITOT 0.5  PROT 6.3*  ALBUMIN 2.9*   No results for input(s): LIPASE, AMYLASE in the last 168 hours. No results for input(s): AMMONIA in the last 168 hours. CBC:  Recent Labs Lab 01/28/15 1547 01/29/15 0353  WBC 14.0* 10.5  HGB 13.0 12.3  HCT 38.4 37.8  MCV 91.4 91.1  PLT 235 219   Cardiac Enzymes: No results for input(s): CKTOTAL, CKMB, CKMBINDEX, TROPONINI in the last 168 hours. BNP (last 3 results) No results for input(s): BNP in the last 8760 hours.  ProBNP (last 3 results) No results for input(s): PROBNP in the last 8760 hours.  CBG:  Recent Labs Lab 01/29/15 5993 01/29/15 1138 01/29/15 1647 01/29/15 2157 01/30/15 5701  GLUCAP 148* 131* 118* 81 76    Recent Results (from the past 240 hour(s))  MRSA PCR Screening     Status: Abnormal   Collection Time: 01/28/15  7:49 PM  Result Value Ref Range Status   MRSA by PCR POSITIVE (A) NEGATIVE Final    Comment:        The GeneXpert MRSA Assay (FDA approved for NASAL specimens only), is one component of a comprehensive MRSA colonization surveillance program. It is not intended to diagnose MRSA infection nor to guide or monitor treatment for MRSA infections. RESULT CALLED TO, READ BACK BY AND VERIFIED WITHDow [name] [phone] @ 2126 BY Susan [name]      Studies: No results found.  Scheduled  Meds: . amLODipine  10 mg Oral Daily  . atenolol  25 mg Oral QHS  . Chlorhexidine Gluconate Cloth  6 each Topical Q0600  . citalopram  40 mg Oral QHS  . cloNIDine  0.2 mg Oral BID  . elvitegravir-cobicistat-emtricitabine-tenofovir  1 tablet Oral Q supper  . enalapril  20 mg Oral Daily  . gabapentin  300 mg Oral BID  . heparin  5,000 Units Subcutaneous 3 times per day  . insulin aspart  0-15 Units Subcutaneous TID WC  . insulin aspart  0-5 Units Subcutaneous QHS  . insulin aspart protamine- aspart  20 Units Subcutaneous BID WC  . mupirocin ointment  1 application Nasal BID  . pantoprazole  40 mg Oral Daily  . pravastatin  80 mg Oral q1800  . QUEtiapine  100 mg Oral Daily  . QUEtiapine  300 mg Oral QHS  . sodium chloride  3 mL Intravenous Q12H   Continuous Infusions:    Principal Problem:   Suicide attempt Active Problems:   HIV disease   Schizoaffective disorder   Morbid obesity   Dyslipidemia   MDD (major depressive disorder), recurrent episode, severe   Diabetes mellitus type 2 without retinopathy   Hypoglycemia    Susan [name], [facility] Pager [phone]. If 7PM-7AM, please contact night-coverage at [email or web address], password [email or web address] 01/30/2015, 9:48 AM  LOS: 2 days

## 2015-01-30 NOTE — Progress Notes (Signed)
The order for simvastatin(Zocor) was changed to an equivalent dose of atorvastatin(Lipitor) due to the potential drug interaction with Norvasc.  When taken in combination with medications that inhibit its metabolism, simvastatin can accumulate which increases the risk of liver toxicity, myopathy, or rhabdomyolysis.  Simvastatin dose should not exceed 10mg /day in patients taking verapamil, diltiazem, fibrates, or niacin >or= 1g/day.   Simvastatin dose should not exceed 20mg /day in patients taking amlodipine, ranolazine or amiodarone.   Please consider this potential interaction at discharge.  Dorrene German 01/30/2015 9:25 PM
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
## 2015-01-31 ENCOUNTER — Encounter (HOSPITAL_COMMUNITY): Payer: Self-pay | Admitting: Psychiatry

## 2015-01-31 DIAGNOSIS — R45851 Suicidal ideations: Secondary | ICD-10-CM

## 2015-01-31 LAB — GLUCOSE, CAPILLARY
GLUCOSE-CAPILLARY: 166 mg/dL — AB (ref 65–99)
Glucose-Capillary: 122 mg/dL — ABNORMAL HIGH (ref 65–99)
Glucose-Capillary: 154 mg/dL — ABNORMAL HIGH (ref 65–99)
Glucose-Capillary: 184 mg/dL — ABNORMAL HIGH (ref 65–99)

## 2015-01-31 MED ORDER — BUPROPION HCL ER (XL) 150 MG PO TB24
150.0000 mg | ORAL_TABLET | Freq: Every day | ORAL | Status: DC
Start: 2015-02-01 — End: 2015-02-02
  Administered 2015-02-01 – 2015-02-02 (×2): 150 mg via ORAL
  Filled 2015-01-31 (×4): qty 1

## 2015-01-31 NOTE — Plan of Care (Signed)
Problem: Diagnosis: Increased Risk For Suicide Attempt Goal: STG-Patient Will Comply With Medication Regime Outcome: Progressing Pt compliant with medication regime     

## 2015-01-31 NOTE — [facility] Suicide Risk Assessment (Signed)
[facility] INPATIENT:  Family/Significant Other Suicide Prevention Education  Suicide Prevention Education:  Patient Refusal for Family/Significant Other Suicide Prevention Education: The patient Susan [name] has refused to provide written consent for family/significant other to be provided Family/Significant Other Suicide Prevention Education during admission and/or prior to discharge.  Physician notified. SPE reviewed with patient and brochure provided. Patient encouraged to return to hospital if having suicidal thoughts, patient verbalized his/her understanding and has no further questions at this time.   Susan [name], [name] 01/31/2015, 12:58 PM

## 2015-01-31 NOTE — Progress Notes (Signed)
Patient ID: Susan [name], female   DOB: [birth year], 54 y.o.   MRN: [id number]  DAR: Pt. Denies HI and visual Hallucinations. Patient endorses passive SI but is able to contract for safety. Patient also reports voices that say, "I'm worthless." Patient says that this is new for her. Patient reports chronic pain in her legs related to Diabetic nerve pain. Patient states that Neurontin does seem to help but reports she is okay managing the pain with an intervention. Patient was reminded that PRN pain medication can be provided if patient does need it. Patient verbalized understanding. Support and encouragement provided to the patient. Scheduled medications administered to patient per physician's orders except for morning blood pressure medications due to patient having low blood pressure this morning. Patient verbalized understanding of this. Patient is receptive and cooperative with staff and is seen in the milieu attending group and interacting with her peers. Q15 minute checks are maintained for safety.

## 2015-01-31 NOTE — Progress Notes (Signed)
D: Patient appeared depressed and flat. Patient reports feeling overwhelmed with her family and grandchildren. Pt reports she is doing too much and feels under appreciated. Pt endorses passive suicidal ideation and verbally contract to come to staff. Pt denies auditory and visual hallucination. Cooperative with assessment.    A: Met with pt 1:1. Medications administered as prescribed. Writer encouraged pt to discuss feelings. Pt encouraged to come to staff with any questions or concerns.   R: Patient is safe and complaint with medications

## 2015-01-31 NOTE — H&P (Signed)
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 need for readmission.  5. Psycho-social education regarding relapse prevention and self care.  6. Health care follow up as needed for medical problems.  7. Review, reconcile, and reinstate any pertinent home medications for other health issues where appropriate. 8. Call for consults with hospitalist for any additional specialty patient care services as needed.  Medical Decision Making:  New problem, with additional work up planned, Review of Psycho-Social Stressors (1), Review or order clinical lab tests (1), Review and summation of old records (2), Review of Medication Regimen & Side Effects (2) and Review of New Medication or Change in Dosage (2)  I certify that inpatient services furnished can  reasonably be expected to improve the patient's condition.   Encarnacion Slates, PMHNP, FNP-BC 5/30/201611:03 AM I personally assessed the patient, reviewed the physical exam and labs and formulated the treatment plan Geralyn Flash A. Sabra Heck, M.D.

## 2015-01-31 NOTE — BHH Group Notes (Signed)
[facility] LCSW Aftercare Discharge Planning Group Note  01/31/2015 8:45 AM  Participation Quality: Alert, Appropriate and Oriented  Mood/Affect: Appropriate  Depression Rating: 8  Anxiety Rating: 8  Thoughts of Suicide: Pt denies SI/HI  Will you contract for safety? Yes  Current AVH: Pt denies  Plan for Discharge/Comments: Pt attended discharge planning group and actively participated in group. CSW provided pt with today's workbook. Pt participated appropriately in group, becoming tearful when sharing with group about suicide attempt. Pt denies any SI at this time and expressed that she is glad that her suicide attempt was no successful. Pt verbalized that she can return home and continue following up with [facility] on an outpatient basis.   Transportation Means: Pt reports access to transportation  Supports: No supports mentioned at this time  Peri Maris, [place] 01/31/2015 11:53 AM

## 2015-01-31 NOTE — Plan of Care (Signed)
Problem: Alteration in mood & ability to function due to Goal: STG-Patient will comply with prescribed medication regimen (Patient will comply with prescribed medication regimen)  Outcome: Progressing Patient is compliant with medications at this time.
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
## 2015-02-01 LAB — GLUCOSE, CAPILLARY
GLUCOSE-CAPILLARY: 144 mg/dL — AB (ref 65–99)
Glucose-Capillary: 176 mg/dL — ABNORMAL HIGH (ref 65–99)
Glucose-Capillary: 211 mg/dL — ABNORMAL HIGH (ref 65–99)

## 2015-02-01 LAB — HEMOGLOBIN A1C
Hgb A1c MFr Bld: 9.3 % — ABNORMAL HIGH (ref 4.8–5.6)
Mean Plasma Glucose: 220 mg/dL

## 2015-02-01 NOTE — Plan of Care (Signed)
Problem: BHH Concurrent Medical Problem Goal: STG-Compliance with medication and/or treatment as ordered (STG-Compliance with medication and/or treatment as ordered by MD)  Outcome: Progressing Pt compliant to medication regimen. Pt reports feeling safe.

## 2015-02-01 NOTE — Progress Notes (Signed)
D: Patient playing card in dayroom. Pt appeared happy smiling with peers. Pt reports she feels a lot better that she was able to talk to provider and now back on home medications. Pt denies SI verbal contract to come to staff if feeling unsafe. Pt denies auditory and visual hallucination. Cooperative with assessment.    A: Met with pt 1:1. Medications administered as prescribed. Writer encouraged pt to discuss feelings. Pt encouraged to come to staff with any questions or concerns.   R: Patient is safe and complaint with medications

## 2015-02-01 NOTE — Progress Notes (Signed)
Patient ID: Susan [name], female   DOB: 1961/03/14, 54 y.o.   MRN: [id number]  DAR: Pt. Denies SI/HI and visual Hallucinations. Patient reports she still is having auditory hallucinations but states they are whispers today. Patient reports neuropathic pain in lower legs and reports Gabapentin is helping. Patient rates her depression, anxiety, and hopelessness at 10/10 today. Patient reports sleep last night was good, appetite is fair, energy level normal, and concentration level is good. Support and encouragement provided to the patient. Scheduled medications administered to patient per physician's orders. Patient is receptive and cooperative. Q15 minute checks are maintained for safety.

## 2015-02-01 NOTE — BHH Group Notes (Signed)
[facility] Group Notes:  (Nursing/MHT/Case Management/Adjunct)  Date:  02/01/2015  Time:  10:07 AM  Type of Therapy:  Nurse Education  Participation Level:  Active  Participation Quality:  Attentive, Sharing and Supportive  Affect:  Blunted  Cognitive:  Alert and Appropriate  Insight:  Limited  Engagement in Group:  Engaged and Supportive  Modes of Intervention:  Discussion and Education  Summary of Progress/Problems: The topic of discussion today was Recovery. Patient's were encouraged to focus on sleep hygiene, gaining and utilizing coping skills. Patient came to group and was focused and engaged. Patient requested to have a journal which was provided. Patient reported she was started on a new medication and asked how long she would be observed. Writer educated patient on the safety of starting new medication and the MD will monitor for a day or so to make sure patient is stable. Jamaia verbalized understanding.   Juelz Whittenberg E 02/01/2015, 10:07 AM

## 2015-02-01 NOTE — Progress Notes (Signed)
Recreation Therapy Notes  Animal-Assisted Activity (AAA) Program Checklist/Progress Notes Patient Eligibility Criteria Checklist & Daily Group note for Rec Tx Intervention  Date: 05.31.16 Time: 2:30pm Location: [address] Dayroom  AAA/T Program Assumption of Risk Form signed by Patient/ or Parent Legal Guardian yes  Patient is free of allergies or sever asthma yes  Patient reports no fear of animals yes  Patient reports no history of cruelty to animals yes  Patient understands his/her participation is voluntary yes  Patient washes hands before animal contact yes  Patient washes hands after animal contact yes  Education: Hand Washing, Appropriate Animal Interaction   Education Outcome: Acknowledges understanding/In group clarification offered/Needs additional education.   Clinical Observations/Feedback: Patient did not attend group.   Kroy Sprung, LRT/CTRS         Mellony Danziger A 02/01/2015 4:29 PM 

## 2015-02-01 NOTE — Tx Team (Signed)
Interdisciplinary Treatment Plan Update (Adult) Date: 02/01/2015   Date: 02/01/2015 8:39 AM  Progress in Treatment:  Attending groups: Yes  Participating in groups: Yes  Taking medication as prescribed: Yes  Tolerating medication: Yes  Family/Significant othe contact made: No, CSW to assess for appropriate contacts Patient understands diagnosis: Yes, AEB her developing insight and motivation for treatment. Discussing patient identified problems/goals with staff: Yes  Medical problems stabilized or resolved: Yes  Denies suicidal/homicidal ideation: Yes Patient has not harmed self or Others: Yes   New problem(s) identified:   Discharge Plan or Barriers: Pt plans to return home and find a new provider that is in contract with her [facility] Medicare as [facility] no longer takes her insurance.   Additional comments:  Patient is a 54 year old African American female admitted following a suicide attempt by overdosing on her insulin. Patient lives in [place] with 2 roommates that she identifies as supportive. Patient also reports a high level of family support. She identifies her primary stressor as "trying to fix my family" and being a caregiver for her grandchildren. Patient plans to return home and would like a referral to a new psychiatrist that is in network with her insurance as [name] no longer accepts her insurance.   Reason for Continuation of Hospitalization:  Anxiety Depression Medication stabilization Suicidal ideation  Estimated length of stay: 3-5 days  For review of initial/current patient goals, please see plan of care.   Attendees:  Patient:    Family:    Physician: Dr. Sabra Heck MD  02/01/2015 8:19 AM  Nursing: Lars Pinks, RN Case manager  02/01/2015 8:19 AM  Clinical Social Worker Peri Maris, Latanya Presser,  02/01/2015 8:19 AM  Other: Lucinda Dell, [name] 02/01/2015 8:19 AM  Clinical: Tressa Busman, RN 02/01/2015 8:19 AM  Other: , RN Charge Nurse 02/01/2015 8:19 AM   Other: Erasmo Downer Drinkard, LCSWA; Chalco Hodnett, LCSW     Norman Clay MSW

## 2015-02-01 NOTE — BHH Group Notes (Signed)
[facility] Association Group Therapy 02/01/2015 1:15pm  Type of Therapy: Mental Health Association Presentation  Participation Level: Active  Participation Quality: Attentive  Affect: Appropriate  Cognitive: Oriented  Insight: Developing/Improving  Engagement in Therapy: Engaged  Modes of Intervention: Discussion, Education and Socialization  Summary of Progress/Problems: Mental Health Association ([place]) Speaker came to talk about his personal journey with substance abuse and addiction. The pt processed ways by which to relate to the speaker. [place] speaker provided handouts and educational information pertaining to groups and services offered by the [facility]. Pt participated appropriately, was attentive and engaged during discussion. Pt was actively engaged AEB her verbal agreement with speaker's statements along with nonverbal gestures that showed that she was receptive to the information. Pt was receptive to resources provided by speaker.     Peri Maris, [place] 02/01/2015 1:26 PM

## 2015-02-01 NOTE — Progress Notes (Signed)
D: Patient playing card in dayroom. Pt started on new antidepressant today reports feeling increase depression and  suicidal ideation. Pt appeared depressed and flat. Pt reports she has good and bad thoughts but the bad thoughts is taking over in her head. Pt endorses auditory hallucination telling her she is worthless.  Pt denies auditory and visual hallucination. Cooperative with assessment.    A: Met with pt 1:1. Medications administered as prescribed. Support and encouragement given to discuss feelings. Pt encouraged to come to staff with any questions or concerns.   R: Patient is safe and complaint with medications.  Pt acknowledge she is in a safe place and will not harm self.

## 2015-02-01 NOTE — Plan of Care (Signed)
Problem: Ineffective individual coping Goal: STG: Patient will remain free from self harm Outcome: Progressing Patient remains free from self harm as evidenced by Q15 minute safety checks. Patient is able to contract for safety and denies SI during this morning's assessment.
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
## 2015-02-02 LAB — GLUCOSE, CAPILLARY
GLUCOSE-CAPILLARY: 259 mg/dL — AB (ref 65–99)
GLUCOSE-CAPILLARY: 113 mg/dL — AB (ref 65–99)
Glucose-Capillary: 121 mg/dL — ABNORMAL HIGH (ref 65–99)
Glucose-Capillary: 174 mg/dL — ABNORMAL HIGH (ref 65–99)

## 2015-02-02 MED ORDER — CYCLOBENZAPRINE HCL 10 MG PO TABS
10.0000 mg | ORAL_TABLET | Freq: Three times a day (TID) | ORAL | Status: DC | PRN
  Administered 2015-02-02 – 2015-02-07 (×2): 10 mg via ORAL
  Filled 2015-02-02 (×2): qty 1

## 2015-02-02 MED ORDER — BUPROPION HCL ER (XL) 300 MG PO TB24
300.0000 mg | ORAL_TABLET | Freq: Every day | ORAL | Status: DC
  Administered 2015-02-03 – 2015-02-08 (×6): 300 mg via ORAL
  Filled 2015-02-02: qty 1
  Filled 2015-02-02: qty 4
  Filled 2015-02-02 (×7): qty 1

## 2015-02-02 MED ORDER — SALINE SPRAY 0.65 % NA SOLN: 1.0000 | NASAL | Status: DC | PRN

## 2015-02-02 NOTE — Progress Notes (Signed)
[facility] Group Notes:  (Nursing/MHT/Case Management/Adjunct)  Date:  02/02/2015  Time:  2100   Type of Therapy:  wrap up group  Participation Level:  Active  Participation Quality:  Appropriate, Attentive, Sharing and Supportive  Affect:  Appropriate and Excited  Cognitive:  Appropriate  Insight:  Improving  Engagement in Group:  Engaged  Modes of Intervention:  Clarification, Education and Support  Summary of Progress/Problems: Pt shares that she has been learning and trying to make healthy choices for her Diabetes.  Pt reports going back home after discharge to " the company and presence of children", her children and grandchildren. Pt shares that she needs to let her kids help her more, learn to accept and ask for help so that she doesn't become overwhelmed.   Jacques Navy 02/02/2015, 10:00 PM

## 2015-02-02 NOTE — Progress Notes (Signed)
D: Pt presents flat in affect and depressed in mood. Pt's affect brightens upon interaction. Pt reports that she is still trying to control the "ups and downs" of her mood. Pt verbalizes that she has been making healthier food selections to better control her diabetes. Pt's was congratulated on her CBG of 113 this evening. Pt is negative for any SI/HI/VH. Pt reports that she continues to hear voices that are telling her that she is "worthless". Pt is visible and active within the milieu. Pt observed laughing and watching tv with the other patients.  A: Writer administered scheduled medications to pt, per MD orders. Continued support and availability as needed was extended to this pt. Staff continue to monitor pt with q34min checks.  R: No adverse drug reactions noted. Pt receptive to treatment. Pt verbally contracts for safety. Pt remains safe at this time.

## 2015-02-02 NOTE — [facility] Group Notes (Signed)
[facility] LCSW Group Therapy 02/02/2015 1:15 PM  Type of Therapy: Group Therapy- Emotion Regulation  Participation Level: Active   Participation Quality:  Appropriate  Affect: Appropriate  Cognitive: Alert and Oriented   Insight:  Developing/Improving  Engagement in Therapy: Developing/Improving and Engaged   Modes of Intervention: Clarification, Confrontation, Discussion, Education, Exploration, Limit-setting, Orientation, Problem-solving, Rapport Building, Art therapist, Socialization and Support  Summary of Progress/Problems: The topic for group today was emotional regulation. This group focused on both positive and negative emotion identification and allowed group members to process ways to identify feelings, regulate negative emotions, and find healthy ways to manage internal/external emotions. Group members were asked to reflect on a time when their reaction to an emotion led to a negative outcome and explored how alternative responses using emotion regulation would have benefited them. Group members were also asked to discuss a time when emotion regulation was utilized when a negative emotion was experienced. Pt continues to actively participate in group discussion and share openly with peers about her recovery and struggle with mental illness. Pt identified grief as the emotion she has the most difficult time regulating. She discussed grief related to lost family relationships, losing her job, a loss of good physical health, among other things. Pt described grief as difficulty to manage because she has little control in it which leads to feelings of despair. Pt identified her faith as a coping mechanism and reaching out for help as strategies for managing her emotions.    Peri Maris, [place] 02/02/2015 5:38 PM
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
## 2015-02-02 NOTE — Progress Notes (Signed)
D: Patient denies SI/HI and A/V hallucinations; patient complains of pain in her legs  A: Monitored q 15 minutes; patient encouraged to attend groups; patient educated about medications; patient given medications per physician orders; patient encouraged to express feelings and/or concerns  R: Patient is cooperative and pleasant; patient engages with staff and peers;  patient was able to set goal to talk with staff 1:1 when having feelings of SI; patient is taking medications as prescribed and tolerating medications; patient is attending all groups

## 2015-02-02 NOTE — BHH Group Notes (Signed)
[facility] LCSW Aftercare Discharge Planning Group Note  02/02/2015 8:45 AM  Participation Quality: Alert, Appropriate and Oriented  Mood/Affect: Appropriate  Depression Rating: 8  Anxiety Rating: 8  Thoughts of Suicide: Pt denies SI/HI  Will you contract for safety? Yes  Current AVH: Pt denies  Plan for Discharge/Comments: Pt attended discharge planning group and actively participated in group. CSW provided pt with today's workbook. Pt reports improving mood, however feels slightly "groggy." Pt verbalized her belief that she was feeling better due to her decline in SI and "dark thoughts." Pt describes using positive thoughts and journaling to help improve her mood. Pt requesting new medication provider because her current provider no longer accepts her insurance.   Transportation Means: Pt reports access to transportation  Supports: No supports mentioned at this time  Peri Maris, Latanya Presser 02/02/2015 12:57 PM

## 2015-02-02 NOTE — Progress Notes (Signed)
Recreation Therapy Notes  Date: 06.01.16 Time: 9:30am Location: 300 Hall Dayroom  Group Topic: Stress Management  Goal Area(s) Addresses:  Patient will verbalize importance of using healthy stress management.  Patient will identify positive emotions associated with healthy stress management.    Behavioral Response:  Engaged  Intervention: [employer]  Activity : Guided Automotive engineer. LRT introduced and educated patients on stress management technique of guided imagery.  A script was used to deliver the technique to patients.  Patients were asked to follow script read by LRT to engage in practicing stress management technique.   Education:  Stress Management, Discharge Planning.   Education Outcome: Acknowledges edcuation/In group clarification offered/Needs additional education  Clinical Observations/Feedback: Patient attended group and was engaged.  Victorino Sparrow, LRT/CTRS         Victorino Sparrow A 02/02/2015 3:35 PM

## 2015-02-02 NOTE — Plan of Care (Signed)
Problem: Diagnosis: Increased Risk For Suicide Attempt Goal: STG-Patient Will Report Suicidal Feelings to Staff Outcome: Progressing Pt reported feeling suicidal knows she is in a safe place and will not harm herself.

## 2015-02-03 LAB — GLUCOSE, CAPILLARY
Glucose-Capillary: 144 mg/dL — ABNORMAL HIGH (ref 65–99)
Glucose-Capillary: 232 mg/dL — ABNORMAL HIGH (ref 65–99)
Glucose-Capillary: 193 mg/dL — ABNORMAL HIGH (ref 65–99)

## 2015-02-03 NOTE — BHH Group Notes (Signed)
[facility] LCSW Group Therapy 02/03/2015 1:15pm  Type of Therapy: Group Therapy- Balance in Life  Participation Level: Active   Description of the Group:  The topic for group was balance in life. Today's group focused on defining balance in one's own words, identifying things that can knock one off balance, and exploring healthy ways to maintain balance in life. Group members were asked to provide an example of a time when they felt off balance, describe how they handled that situation,and process healthier ways to regain balance in the future. Group members were asked to share the most important tool for maintaining balance that they learned while at [facility] and how they plan to apply this method after discharge.  Summary of Patient Progress Pt participated appropriately in group, however can dominate discussion at times. She is encouraging to other peers and receptive to suggestions from others. Pt insight continues to improve, although she reports continued sadness and depression. Pt discussed that she is most balance when she is in control of her emotions and able to navigate life events appropriately. Pt also discussed the balance she has gained in her 6 years of recovery as compared to when she was using substances. Pt identified reaching out for help as a strategy she will continue to use to maintain balance in her life when she is discharged.     Therapeutic Modalities:   Cognitive Behavioral Therapy Solution-Focused Therapy Assertiveness Training   Peri Maris, [place] 02/03/2015 4:06 PM
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
## 2015-02-03 NOTE — Progress Notes (Signed)
Adult Psychoeducational Group Note  Date:  02/03/2015 Time:  10:57 PM  Group Topic/Focus:  Wrap-Up Group:   The focus of this group is to help patients review their daily goal of treatment and discuss progress on daily workbooks.  Participation Level:  Active  Participation Quality:  Appropriate  Affect:  Appropriate  Cognitive:  Appropriate  Insight: Appropriate  Engagement in Group:  Engaged  Modes of Intervention:  Discussion  Additional Comments:  Pt denied having any thoughts of harm self or anyone else.  Jaycelyn Orrison C 02/03/2015, 10:57 PM 

## 2015-02-03 NOTE — Progress Notes (Signed)
D: Pt denies SI/HI/AV. Pt is pleasant and cooperative. Pt rates depression at a 8, anxiety at a 10, and Helplessness/hopelessness at a 10.  A: Pt was offered support and encouragement. Pt was given scheduled medications. Pt was encourage to attend groups. Q 15 minute checks were done for safety.  R:Pt attends groups and interacts well with peers and staff. Pt taking medication. Pt has no complaints.Pt receptive to treatment and safety maintained on unit.

## 2015-02-04 LAB — GLUCOSE, CAPILLARY
GLUCOSE-CAPILLARY: 281 mg/dL — AB (ref 65–99)
GLUCOSE-CAPILLARY: 192 mg/dL — AB (ref 65–99)
GLUCOSE-CAPILLARY: 146 mg/dL — AB (ref 65–99)
Glucose-Capillary: 157 mg/dL — ABNORMAL HIGH (ref 65–99)
Glucose-Capillary: 293 mg/dL — ABNORMAL HIGH (ref 65–99)

## 2015-02-04 NOTE — Progress Notes (Addendum)
D: Pt presents appropriate in affect and depressed in mood. Pt verbalizes understanding of Dr. Sabra Heck' continued treatment plan. Pt is receptive to staying over the weekend. Pt is aware of the need to report the severity of her depression as Dr. Sabra Heck reassess her need for the addition of Abilify to her medication regimen. Pt is currently negative for any SI/HI/AVH. Pt observed laughing and talking with the other patients. Pt verbalizes a plan to stay clean and focus on her well-being.  A: Writer administered scheduled medications to pt, per MD orders. Continued support and availability as needed was extended to this pt. Staff continue to monitor pt with q76min checks.  R: No adverse drug reactions noted. Pt receptive to treatment. Pt remains safe at this time.

## 2015-02-04 NOTE — Tx Team (Signed)
Interdisciplinary Treatment Plan Update (Adult) Date: 02/04/2015   Date: 02/04/2015 8:37 AM  Progress in Treatment:  Attending groups: Yes  Participating in groups: Yes  Taking medication as prescribed: Yes  Tolerating medication: Yes  Family/Significant othe contact made: No, Pt declines family contact Patient understands diagnosis: Yes, AEB her developing insight and motivation for treatment. Discussing patient identified problems/goals with staff: Yes  Medical problems stabilized or resolved: Yes  Denies suicidal/homicidal ideation: Yes Patient has not harmed self or Others: Yes   New problem(s) identified:   Discharge Plan or Barriers: Pt plans to return home and find a new provider that is in contract with her [facility] Medicare as [facility] no longer takes her insurance.   02/04/2015: Pt has follow-up scheduled with [place] and will return home. MD continuing to adjust medications and monitor Pt level of depression. She continues to report increased depression and "feeling like she is in a dark place."  Additional comments:  Patient is a 54 year old African American female admitted following a suicide attempt by overdosing on her insulin. Patient lives in [place] with 2 roommates that she identifies as supportive. Patient also reports a high level of family support. She identifies her primary stressor as "trying to fix my family" and being a caregiver for her grandchildren. Patient plans to return home and would like a referral to a new psychiatrist that is in network with her insurance as [name] no longer accepts her insurance.   Reason for Continuation of Hospitalization:  Anxiety Depression Medication stabilization Suicidal ideation  Estimated length of stay: 2-3 days  For review of initial/current patient goals, please see plan of care.   Attendees:  Patient:    Family:    Physician: Dr. Sabra Heck MD  02/04/2015 8:25 AM  Nursing: Lars Pinks, RN Case manager   02/04/2015 8:25 AM  Clinical Social Worker Norman Clay, MSW 02/04/2015 8:25 AM  Other: Lucinda Dell, [name]asion 02/04/2015 8:25 AM  Clinical:  Kathrin Penner., RN 02/04/2015 8:25 AM  Other: , RN Charge Nurse 02/04/2015 8:25 AM  Other: Jairo Ben, LCSW; Franki Cabot, LCSW      Norman Clay MSW

## 2015-02-04 NOTE — Progress Notes (Signed)
Patient ID: Susan [name], female   DOB: [birth year], 54 y.o.   MRN: [id number]   Pt currently presents with a sad affect and cooperative, pleasant behavior. Per self inventory, pt rates depression, hopelessness and anxiety at an 8. Pt's daily goal is "loving myself" and they intend to do so by "write in my book." Pt reports good sleep, poor concentration and a fair appetite. Pt tearfully states "I'm just depressed." Pt interacts pleasantly with other pts on the unit.  Pt provided with medications per providers orders. Pt's labs and vitals were monitored throughout the day. Pt supported emotionally and encouraged to express concerns and questions. Pt educated on medications. Pt's safety ensured with 15 minute and environmental checks. Pt currently denies SI/HI and A/V hallucinations. Pt verbally agrees to seek staff if SI/HI or A/VH occurs and to consult with staff before acting on these thoughts. Will continue POC.

## 2015-02-04 NOTE — BHH Group Notes (Signed)
[facility] LCSW Aftercare Discharge Planning Group Note  02/04/2015 8:45 AM  Participation Quality: Alert, Appropriate and Oriented  Mood/Affect: Lethargic  Depression Rating: 8  Anxiety Rating: 8  Thoughts of Suicide: Pt denies SI/HI  Will you contract for safety? Yes  Current AVH: Pt denies  Plan for Discharge/Comments: Pt attended discharge planning group and actively participated in group. CSW provided pt with today's workbook. Pt presented lethargic, but reported that she had to take some medicine for her diabetic nerve pain which makes her sleepy. She reports some improvement in mood as she is starting to accept her negative emotions and cope with them more effectively. Pt requesting a pair of slippers to wear.   Transportation Means: Pt reports access to transportation  Supports: Adult children  Peri Maris, [place] 02/04/2015 10:07 AM

## 2015-02-04 NOTE — Progress Notes (Signed)
Recreation Therapy Notes  Date: 06.03.16 Time: 9:30am Location: 300 Group Room  Group Topic: Stress Management  Goal Area(s) Addresses:  Patient will verbalize importance of using healthy stress management.  Patient will identify positive emotions associated with healthy stress management.   Behavioral Response: Attentive  Intervention: Stress Management  Activity :  Progressive Muscle Relaxation.  LRT introduced patients to stress management technique of progressive muscle relaxation.  A script was used to deliver the technique and patients were asked to follow script read by LRT to engage in practicing the stress management technique.    Education:  Stress Management, Discharge Planning.   Education Outcome: Acknowledges edcuation/In group clarification offered  Clinical Observations/Feedback: Patient attended group.   Victorino Sparrow, LRT/CTRS         Victorino Sparrow A 02/04/2015 1:37 PM
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
## 2015-02-04 NOTE — Progress Notes (Signed)
Patient ID: Susan [name], female   DOB: March 20, 1961, 54 y.o.   MRN: [id number]  D: Patient with bright affect and noted to be laughing multiple times throughout shift with peers in the milieu. Pt denies any SI at this time. No complaints on assessment. A: Q 15 minute safety checks, encourage staff/peer interaction and group participation. Administer medications as ordered by MD.  R: Patient verbally contracts for safety. Pt compliant with medications at HS and she did participate in group session. No s/s of distress noted.

## 2015-02-05 DIAGNOSIS — F332 Major depressive disorder, recurrent severe without psychotic features: Principal | ICD-10-CM

## 2015-02-05 LAB — GLUCOSE, CAPILLARY
Glucose-Capillary: 178 mg/dL — ABNORMAL HIGH (ref 65–99)
Glucose-Capillary: 357 mg/dL — ABNORMAL HIGH (ref 65–99)
Glucose-Capillary: 159 mg/dL — ABNORMAL HIGH (ref 65–99)

## 2015-02-05 NOTE — [facility] Group Notes (Signed)
[facility] Group Notes:  (Nursing/MHT/Case Management/Adjunct)  Date:  02/05/2015  Time:  0900  Type of Therapy:  Nurse Education  Participation Level:  Minimal  Participation Quality:  Appropriate  Affect:  Appropriate  Cognitive:  Appropriate  Insight:  Appropriate  Engagement in Group:  Limited  Modes of Intervention:  Discussion  Summary of Progress/Problems:  Susan [name] 02/05/2015, 1:00 PM

## 2015-02-05 NOTE — B[facility] Group Notes (Signed)
[facility] LCSW Group Therapy 02/04/2015 1:15pm  Type of Therapy: Group Therapy- Feelings Around Relapse and Recovery  Participation Level: Active   Participation Quality:  Appropriate  Affect:  Appropriate   Cognitive: Alert and Oriented   Insight:  Developing/Improving   Engagement in Therapy: Developing/Improving and Engaged   Modes of Intervention: Clarification, Confrontation, Discussion, Education, Exploration, Limit-setting, Orientation, Problem-solving, Rapport Building, Art therapist, Socialization and Support  Summary of Progress/Problems: The topic for today was feelings about relapse. Pt discussed what relapse prevention is to them and identified triggers that they are on the path to relapse. Pt processed their feeling towards relapse and was able to relate to peers. Pt discussed coping skills that can be used for relapse prevention. Pt continues to be engaged in group discussion, interacting appropriately with peers. Pt often describes how her faith provides her with a different perspective in her recovery journey, helping her to stay clean. Pt discussed how she has been clean from heroine for 6 years but now struggles with relapses in depression and anxiety. Pt continues to emphasize her need to reach out for help in the community rather than bottle up her emotions and be prideful.    Therapeutic Modalities:   Cognitive Behavioral Therapy Solution-Focused Therapy Assertiveness Training Relapse Prevention Therapy    Peri Maris, [place]
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
## 2015-02-05 NOTE — Progress Notes (Signed)
Pt attended group this evening. 

## 2015-02-05 NOTE — Progress Notes (Signed)
Assumed pt care at 1530pm. D: Patient is alert and oriented. Pt's mood and affect is pleasant and blunted. Pt denies SI/HI and AVH. Pt remains in bed resting the majority of the afternoon. Pt has attended unit groups today. A: Active listening by RN. Encouragement/Support provided to pt. Medication education reviewed with pt. Scheduled medications administered per providers orders (See MAR). 15 minute checks continued per protocol for patient safety.  R: Patient cooperative and receptive to nursing interventions. Pt remains safe.

## 2015-02-05 NOTE — [facility] Group Notes (Signed)
[place] Group Notes:  (Clinical Social Work)  02/05/2015     10-11AM  Summary of Progress/Problems:   The main focus of today's process group was to learn how to use a decisional balance exercise to move forward in the Stages of Change, which were described and discussed.  Motivational Interviewing and a worksheet were utilized to help patients explore in depth the perceived benefits and costs of unhealthy coping techniques, as well as the  benefits and costs of replacing that with a healthy coping skills.   The patient expressed that her unhealthy coping involves letting people get to her, and stated she has had 6 years clean.  She was very participatory throughout group with her past experiences in getting off substances.  Type of Therapy:  Group Therapy - Process   Participation Level:  Active  Participation Quality:  Appropriate, Attentive, Sharing and Supportive  Affect:  Blunted  Cognitive:  Alert, Appropriate and Oriented  Insight:  Engaged  Engagement in Therapy:  Engaged  Modes of Intervention:  Education, Motivational Interviewing  Selmer Dominion, LCSW 02/05/2015, 12:55 PM

## 2015-02-06 LAB — GLUCOSE, CAPILLARY
Glucose-Capillary: 215 mg/dL — ABNORMAL HIGH (ref 65–99)
Glucose-Capillary: 204 mg/dL — ABNORMAL HIGH (ref 65–99)
Glucose-Capillary: 239 mg/dL — ABNORMAL HIGH (ref 65–99)
Glucose-Capillary: 186 mg/dL — ABNORMAL HIGH (ref 65–99)
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
## 2015-02-06 NOTE — [facility] Group Notes (Signed)
[place] Group Notes:  (Clinical Social Work)  02/06/2015  10:00-11:00AM  Summary of Progress/Problems:   The main focus of today's process group was to   1)  discuss the importance of adding supports  2)  define health supports versus unhealthy supports  3)  identify the patient's current unhealthy supports and plan how to handle them  4)  Identify the patient's current healthy supports and plan what to add.  An emphasis was placed on using counselor, doctor, therapy groups, 12-step groups, and problem-specific support groups to expand supports.    The patient expressed full comprehension of the concepts presented, and agreed that there is a need to add more supports.  The patient stated her 3 grown children are healthy supports because they show her love.  Other people being judgmental are unhealthy for her.  She also talked about being a healthy support for herself by admitting up front to her doctors that she is a recovering heroin addict, telling them not to prescribe her anything that would cause her problems.  Type of Therapy:  Process Group with Motivational Interviewing  Participation Level:  Active  Participation Quality:  Attentive, Drowsy, Sharing and Supportive  Affect:  Blunted  Cognitive:  Appropriate and Oriented  Insight:  Engaged  Engagement in Therapy:  Engaged  Modes of Intervention:   Education, Support and Processing, Activity  Selmer Dominion, LCSW 02/06/2015

## 2015-02-06 NOTE — Progress Notes (Signed)
Patient ID: Susan [name], female   DOB: 10-02-1960, 54 y.o.   MRN: [id number]  D: Patient pleasant and with bright affect. Pt states "I feel a lot better about things lately". Pt rates depression a 2. Pt interacting well with peers in milieu.  A: Q 15 minute safety checks, encourage staff/peer interaction, administer medications as ordered by MD. R: Pt compliant with hs medications and participated in group session.

## 2015-02-06 NOTE — Plan of Care (Signed)
Problem: Consults Goal: Anxiety Disorder Patient Education See Patient Education Module for eduction specifics.  Outcome: Completed/Met Date Met:  02/06/15 Nurse discussed anxiety/coping skills with patient.

## 2015-02-06 NOTE — Progress Notes (Signed)
Patient ID: Susan [name], female   DOB: [birth year], 54 y.o.   MRN: [id number]  Adult Psychoeducational Group Note  Date:  02/06/2015 Time: 0900   Group Topic/Focus:  Making Healthy Choices:   The focus of this group is to help patients identify negative/unhealthy choices they were using prior to admission and identify positive/healthier coping strategies to replace them upon discharge.  Participation Level:  Minimal  Participation Quality:  Attentive  Affect:  Flat  Cognitive:  Alert and Oriented  Insight: Improving  Engagement in Group:  Minimal  Modes of Intervention:  Discussion, Education, Role-play and Support  Additional Comments:  Pt was unable to identify one unhealthy choice and a healthy support system in group today. Pt kept her head down for most of the group.   Elenore Rota 02/06/2015, 9:55 AM

## 2015-02-06 NOTE — [facility] Group Notes (Signed)
D:  Patient's self inventory sheet, patient slept good last night, no sleep medication given.  Good appetite, normal energy level, good concentration.  Rated depression, hopeless and anxiety #8.  Denied withdrawals.  Denied SI.  Physical problems, legs, worst pain #10 in past 24 hours.  Pain medication is helpful.  Goal is to listen.  Plans to attend groups.  Does have discharge plans. A:  Medications administered per MD orders.  Emotional support and encouragement given patient. R:  Denied SI and HI, contracts for safety.  Denied A/V hallucinations.  Safety maintained with 15 minute checks.

## 2015-02-06 NOTE — Progress Notes (Signed)
Patient ID: Susan [name], female   DOB: 1960/09/24, 54 y.o.   MRN: [id number]  D: Patient pleasant and with bright affect on assessment. Patient interacting well with peers in the milieu and attended group session. Pt states she is ready to go home tomorrow and denies SI. A: Q 15 minute safety checks, encourage staff/peer interactions and group participation, administer medications as ordered by physician. R: Pt compliant with hs medications; no s/s of distress noted.

## 2015-02-06 NOTE — Progress Notes (Signed)
Patient did attend the evening speaker AA meeting.  

## 2015-02-07 DIAGNOSIS — F411 Generalized anxiety disorder: Secondary | ICD-10-CM | POA: Diagnosis present

## 2015-02-07 LAB — GLUCOSE, CAPILLARY
GLUCOSE-CAPILLARY: 185 mg/dL — AB (ref 65–99)
Glucose-Capillary: 162 mg/dL — ABNORMAL HIGH (ref 65–99)
Glucose-Capillary: 297 mg/dL — ABNORMAL HIGH (ref 65–99)
Glucose-Capillary: 151 mg/dL — ABNORMAL HIGH (ref 65–99)

## 2015-02-07 NOTE — Progress Notes (Signed)
Pt attended spiritual care group on grief and loss facilitated by chaplain Jerene Pitch. Group opened with brief discussion and psycho-social ed around grief and loss in relationships and in relation to self - identifying life patterns, circumstances, changes that cause losses. Established group norm of speaking from own life experience. Group goal of establishing open and affirming space for members to share loss and experience with grief, normalize grief experience and provide psycho social education and grief support.  Group drew on narrative and [place]ian therapeutic modalities.    Myrtis was present throughout group.  She voiced affirmation of the psychosocial ed portion of group and offered support to one of the group members who shared about the loss of her brother.  Brianca did not engage in group discussion otherwise.    [place], [place]

## 2015-02-07 NOTE — [facility] Group Notes (Signed)
[place] LCSW Group Therapy  02/07/2015 1:15pm  Type of Therapy:  Group Therapy vercoming Obstacles  Participation Level:  Active  Participation Quality:  Appropriate   Affect:  Appropriate  Cognitive:  Appropriate and Oriented  Insight:  Developing/Improving and Improving  Engagement in Therapy:  Improving  Modes of Intervention:  Discussion, Exploration, Problem-solving and Support  Description of Group:   In this group patients will be encouraged to explore what they see as obstacles to their own wellness and recovery. They will be guided to discuss their thoughts, feelings, and behaviors related to these obstacles. The group will process together ways to cope with barriers, with attention given to specific choices patients can make. Each patient will be challenged to identify changes they are motivated to make in order to overcome their obstacles. This group will be process-oriented, with patients participating in exploration of their own experiences as well as giving and receiving support and challenge from other group members.  Summary of Patient Progress: Pt continues to be engaged in group discussion, participating willingly and offering encouragement to peers. Pt discussed goals and needed changes at discharge in order to meet her goal of remaining physically and mentally well, identifying listening to professionals and family as a behavior she needs to develop. Pt also described her propensity to be  "follower" and let "other people get in her way" and how this serves as an obstacle to her achieving necessary change and wellness.    Therapeutic Modalities:   Cognitive Behavioral Therapy Solution Focused Therapy Motivational Interviewing Relapse Prevention Therapy   Peri Maris, LCSWA 02/07/2015 3:39 PM

## 2015-02-07 NOTE — [facility] Group Notes (Signed)
[facility] LCSW Aftercare Discharge Planning Group Note  02/07/2015 8:45 AM  Participation Quality: Alert, Appropriate and Oriented  Mood/Affect: Flat and Anxious  Depression Rating: 8  Anxiety Rating: 8  Thoughts of Suicide: Pt denies SI/HI  Will you contract for safety? Yes  Current AVH: Pt denies  Plan for Discharge/Comments: Pt attended discharge planning group and actively participated in group. CSW provided pt with today's workbook. Pt reported feeling scared and anxious but could not identify the specific trigger causing these feelings. Pt reports having supportive family who will help her at discharge.  Transportation Means: Pt reports access to transportation  Supports: Adult children  Peri Maris, [place] 02/07/2015 9:32 AM

## 2015-02-07 NOTE — Progress Notes (Signed)
Adult Psychoeducational Group Note  Date:  02/07/2015 Time:  2000  Group Topic/Focus:  AA  Participation Level:  Active  Participation Quality:  Appropriate  Affect:  Appropriate  Cognitive:  Appropriate  Insight: Appropriate  Engagement in Group:  Engaged  Modes of Intervention:  Discussion  Additional Comments:  Pt attended [place] group.   Jhace Fennell Chanel 02/07/2015, 11:57 PM

## 2015-02-07 NOTE — Progress Notes (Signed)
Recreation Therapy Notes  Date: 06.06.16 Time: 9:30 am Location: [address] Group Room  Group Topic: Coping Skills  Goal Area(s) Addresses:  Patient will verbalize importance of using healthy stress management. Patient will identify positive emotions associated with healthy stress management.  Behavioral Response:  Engaged  Intervention: Stress Management  Activity: Healthy Relaxation Guided Imagery.  LRT introduced and educated patients on stress management technique of guided imagery.  Script was used to deliver the technique to patients.  Patients were asked to follow script read aloud by LRT to engage in practicing the stress management technique.   Education: Radiographer, therapeutic, Dentist.   Education Outcome: Acknowledges understanding/In group clarification offered/Needs additional education.   Clinical Observations/Feedback: Patient attended group.    Victorino Sparrow, LRT/CTRS         Victorino Sparrow A 02/07/2015 3:38 PM

## 2015-02-07 NOTE — Progress Notes (Signed)
Patient ID: Susan [name], female   DOB: 08-10-1961, 54 y.o.   MRN: [id number]   Pt currently presents with a flat affect and depressed behavior. Per self inventory, pt rates depression, hopelessness and anxiety at 8/10. Pt's daily goal is to "listen" and they intend to do so by "go to groups." Pt reports good sleep, good concentration, normal energy and a good appetite.   Pt provided with medications per providers orders. Pt's labs and vitals were monitored throughout the day. Pt supported emotionally and encouraged to express concerns and questions. Pt educated on medications and alternative stress management techniques.  Pt's safety ensured with 15 minute and environmental checks. Pt currently denies SI/HI and A/V hallucinations. Pt verbally agrees to seek staff if SI/HI or A/VH occurs and to consult with staff before acting on these thoughts. Will continue POC. Pt to be discharged tomorrow.
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
## 2015-02-08 LAB — GLUCOSE, CAPILLARY
Glucose-Capillary: 170 mg/dL — ABNORMAL HIGH (ref 65–99)
Glucose-Capillary: 201 mg/dL — ABNORMAL HIGH (ref 65–99)

## 2015-02-08 MED ORDER — QUETIAPINE FUMARATE 300 MG PO TABS: 300.0000 mg | ORAL_TABLET | Freq: Every day | ORAL | Status: DC

## 2015-02-08 MED ORDER — QUETIAPINE FUMARATE 100 MG PO TABS: 100.0000 mg | ORAL_TABLET | Freq: Every day | ORAL | Status: DC

## 2015-02-08 MED ORDER — STRIBILD 150-150-200-300 MG PO TABS: 1.0000 | ORAL_TABLET | Freq: Every day | ORAL | Status: DC

## 2015-02-08 MED ORDER — CARRINGTON MOISTURE BARRIER EX CREA: TOPICAL_CREAM | CUTANEOUS | Status: DC | PRN

## 2015-02-08 MED ORDER — SALINE SPRAY 0.65 % NA SOLN: 1.0000 | NASAL | Status: DC | PRN

## 2015-02-08 MED ORDER — AMLODIPINE BESYLATE 10 MG PO TABS: 10.0000 mg | ORAL_TABLET | Freq: Every day | ORAL | Status: DC

## 2015-02-08 MED ORDER — INSULIN ASPART PROT & ASPART (70-30 MIX) 100 UNIT/ML ~~LOC~~ SUSP: 20.0000 [IU] | Freq: Two times a day (BID) | SUBCUTANEOUS | Status: DC

## 2015-02-08 MED ORDER — OMEPRAZOLE 20 MG PO CPDR: 20.0000 mg | DELAYED_RELEASE_CAPSULE | Freq: Every day | ORAL | Status: DC

## 2015-02-08 MED ORDER — CETIRIZINE HCL 10 MG PO TABS: 10.0000 mg | ORAL_TABLET | Freq: Every day | ORAL | Status: DC

## 2015-02-08 MED ORDER — CITALOPRAM HYDROBROMIDE 40 MG PO TABS: 40.0000 mg | ORAL_TABLET | Freq: Every day | ORAL | Status: DC

## 2015-02-08 MED ORDER — ENALAPRIL MALEATE 20 MG PO TABS: 20.0000 mg | ORAL_TABLET | Freq: Every day | ORAL | Status: DC

## 2015-02-08 MED ORDER — CYCLOBENZAPRINE HCL 10 MG PO TABS: 10.0000 mg | ORAL_TABLET | Freq: Three times a day (TID) | ORAL | Status: DC | PRN

## 2015-02-08 MED ORDER — HYDROXYZINE HCL 25 MG PO TABS: 25.0000 mg | ORAL_TABLET | Freq: Three times a day (TID) | ORAL | Status: DC | PRN

## 2015-02-08 MED ORDER — ATENOLOL 25 MG PO TABS: 25.0000 mg | ORAL_TABLET | Freq: Every day | ORAL | Status: DC

## 2015-02-08 MED ORDER — GLUCOSE BLOOD VI STRP: 1.0000 | ORAL_STRIP | Freq: Two times a day (BID) | Status: DC

## 2015-02-08 MED ORDER — FLUTICASONE PROPIONATE 50 MCG/ACT NA SUSP
2.0000 | Freq: Every day | NASAL | Status: DC
Start: 2015-02-08 — End: 2015-04-18

## 2015-02-08 MED ORDER — CLONIDINE HCL 0.2 MG PO TABS: 0.2000 mg | ORAL_TABLET | Freq: Two times a day (BID) | ORAL | Status: DC

## 2015-02-08 MED ORDER — GABAPENTIN 300 MG PO CAPS: 300.0000 mg | ORAL_CAPSULE | Freq: Two times a day (BID) | ORAL | Status: DC

## 2015-02-08 MED ORDER — BUPROPION HCL ER (XL) 300 MG PO TB24: 300.0000 mg | ORAL_TABLET | Freq: Every day | ORAL | Status: DC

## 2015-02-08 MED ORDER — SIMVASTATIN 40 MG PO TABS: 40.0000 mg | ORAL_TABLET | Freq: Every day | ORAL | Status: DC

## 2015-02-08 NOTE — Progress Notes (Signed)
Patient discharged per MD orders. Patient given education regarding follow-up appointments and medications. Patient denies any questions or concerns about these instructions. Patient was escorted to locker and given belongings before discharge to hospital lobby. Patient currently denies SI/HI and auditory and visual hallucinations on discharge.
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
## 2015-02-08 NOTE — [facility] Group Notes (Signed)
[facility] Group Notes:  (Nursing/MHT/Case Management/Adjunct)  Date:  02/08/2015  Time:  0915am  Type of Therapy:  Nurse Education  Participation Level:  Active  Participation Quality:  Appropriate and Attentive  Affect:  Appropriate  Cognitive:  Alert and Appropriate  Insight:  Appropriate and Good  Engagement in Group:  Engaged and Supportive  Modes of Intervention:  Discussion, Education and Support  Summary of Progress/Problems: Patient attended group, remained engaged, and responded appropriately when prompted. Pt was supportive of other pts and expressed positivity in her discussion today.  Charlyne Quale A 02/08/2015, 12:48 PM

## 2015-02-08 NOTE — Progress Notes (Signed)
D: Patient is excited gooing home tomorrow stated "I ;m. Ok; ". Denies pain, SI, AH/VA at this time. No new complaint.  A: Encouragement offered. Due medications given as ordered. Every 15 minutes check for safety maintained. Will continue to monitor patent for safety.  R: Patient remains appropriate.

## 2015-02-08 NOTE — Progress Notes (Signed)
  [facility] Adult Case Management Discharge Plan :  Will you be returning to the same living situation after discharge:  Yes,  Pt returning to her home At discharge, do you have transportation home?: Yes,  Pt's son to provide transportation Do you have the ability to pay for your medications: Yes,  Pt provided with samples and prescriptions  Release of information consent forms completed and in the chart;  Patient's signature needed at discharge.  Patient to Follow up at: Follow-up Information    Follow up with [facility] On 02/16/2015.   Why:  at 1:00pm for therapy.   Contact information:   [place],  [place], [place] [address] [phone]      Follow up with [facility] On 02/24/2015.   Why:  at 10:00am for medication management.    Contact information:   [address],  [place],  [address] [phone]      Patient denies SI/HI: Yes,  Pt denies    Safety Planning and Suicide Prevention discussed: Yes,  with Pt. Declined family contact.  Have you used any form of tobacco in the last 30 days? (Cigarettes, Smokeless Tobacco, Cigars, and/or Pipes): Yes  Has patient been referred to the Quitline?: Patient refused referral  Bo Mcclintock 02/08/2015, 11:18 AM

## 2015-02-08 NOTE — [facility] Suicide Risk Assessment (Signed)
[facility] Discharge Suicide Risk Assessment   Demographic Factors:  NA  Total Time spent with patient: 30 minutes  Musculoskeletal: Strength & Muscle Tone: within normal limits Gait & Station: normal Patient leans: normal  Psychiatric Specialty Exam: Physical Exam  Review of Systems  Constitutional: Negative.   HENT: Negative.   Eyes: Negative.   Respiratory: Negative.   Cardiovascular: Negative.   Gastrointestinal: Negative.   Genitourinary: Negative.   Musculoskeletal: Negative.   Skin: Negative.   Neurological: Negative.   Endo/Heme/Allergies: Negative.   Psychiatric/Behavioral: The patient is nervous/anxious.     Blood pressure 145/91, pulse 77, temperature 98.7 F (37.1 C), temperature source Oral, resp. rate 20, height 5' 3.5" (1.613 m), weight 132.904 kg (293 lb), last menstrual period 08/31/2014, SpO2 100 %.Body mass index is 51.08 kg/(m^2).  General Appearance: Fairly Groomed  Engineer, water::  Fair  Speech:  Clear and [id number]  Volume:  Normal  Mood:  Anxious  Affect:  Appropriate  Thought Process:  Coherent and Goal Directed  Orientation:  Full (Time, Place, and Person)  Thought Content:  plans as she moves on  Suicidal Thoughts:  No  Homicidal Thoughts:  No  Memory:  Immediate;   Fair Recent;   Fair Remote;   Fair  Judgement:  Fair  Insight:  Present  Psychomotor Activity:  Normal  Concentration:  Fair  Recall:  [employer] of Knowledge:Fair  Language: Fair  Akathisia:  No  Handed:  Right  AIMS (if indicated):     Assets:  Desire for Improvement  Sleep:  Number of Hours: 6.5  Cognition: WNL  ADL's:  Intact   Have you used any form of tobacco in the last 30 days? (Cigarettes, Smokeless Tobacco, Cigars, and/or Pipes): Yes  Has this patient used any form of tobacco in the last 30 days? (Cigarettes, Smokeless Tobacco, Cigars, and/or Pipes) Yes, Prescription not provided because: will be given patches  Mental Status Per Nursing Assessment::   On  Admission:  Suicidal ideation indicated by patient  Current Mental Status by Physician: In full contact with reality. There are no active SI plans or intent. She states she is still somewhat anxious but she feels she can handle the anxiety better now. States that knowing that she is going to have access to the nurse that will come to the house helps with anxiety. She states she is more aware that she has not reached out and access her support when she has gotten depressed before but states that she is going to be more proactive now.   Loss Factors: Decline in physical health  Historical Factors: NA  Risk Reduction Factors:   Sense of responsibility to family and Positive social support  Continued Clinical Symptoms:  Depression:   Insomnia  Cognitive Features That Contribute To Risk:  Closed-mindedness, Polarized thinking and Thought constriction (tunnel vision)    Suicide Risk:  Minimal: No identifiable suicidal ideation.  Patients presenting with no risk factors but with morbid ruminations; may be classified as minimal risk based on the severity of the depressive symptoms  Principal Problem: MDD (major depressive disorder), recurrent severe, without psychosis Discharge Diagnoses:  Patient Active Problem List   Diagnosis Date Noted  . GAD (generalized anxiety disorder) [F41.1] 02/07/2015    Priority: High  . MDD (major depressive disorder), recurrent severe, without psychosis [F33.2] 01/30/2015    Priority: High  . Diabetes mellitus type 2 without retinopathy [E11.9] 01/28/2015  . Hypoglycemia [E16.2] 01/28/2015  . Suicide attempt [T14.91] 01/28/2015  .  Non-compliant behavior [R46.89] 01/14/2015  . Onychomycosis [B35.1] 09/14/2014  . Pain in lower limb [M79.606] 09/14/2014  . Screening examination for venereal disease [Z11.3] 06/02/2014  . MDD (major depressive disorder), recurrent episode, severe [F33.2] 05/02/2014  . Suicidal ideation [R45.851] 05/02/2014  . Depression,  major, recurrent [F33.9] 06/18/2013  . Unspecified hereditary and idiopathic peripheral neuropathy [G60.9] 06/03/2013  . Allergic rhinitis [J30.9]   . Benign colon polyp [K63.5] 01/02/2013  . Dysfunctional uterine bleeding [N93.8] 06/04/2012  . Dyslipidemia [E78.5]   . Menometrorrhagia [N92.1]   . GERD (gastroesophageal reflux disease) [K21.9]   . Diabetes [E11.9] 02/13/2012  . Schizoaffective disorder [F25.9] 04/26/2011  . Morbid obesity [E66.01] 04/26/2011  . Asthma   . HIV disease [B20] 03/28/2011  . Hypertension [I10] 03/28/2011    Follow-up Information    Follow up with [facility] On 02/16/2015.   Why:  at 1:00pm for therapy.   Contact information:   [place],  [place], [place] [address] [phone]      Follow up with [facility] On 02/24/2015.   Why:  at 10:00am for medication management.    Contact information:   [address],  [place], [place] [address] [phone]      Plan Of Care/Follow-up recommendations:  Activity:  as tolerated Diet:  regular Follow up [facility] as above Is patient on multiple antipsychotic therapies at discharge:  No   Has Patient had three or more failed trials of antipsychotic monotherapy by history:  No  Recommended Plan for Multiple Antipsychotic Therapies: NA    Atticus Wedin A 02/08/2015, 10:07 AM

## 2015-02-23 ENCOUNTER — Telehealth: Payer: Self-pay | Admitting: *Deleted

## 2015-02-23 NOTE — Telephone Encounter (Signed)
Unable to reach patient about a1c

## 2015-03-23 ENCOUNTER — Other Ambulatory Visit: Payer: Self-pay | Admitting: Internal Medicine

## 2015-03-29 ENCOUNTER — Other Ambulatory Visit: Payer: Self-pay | Admitting: Internal Medicine

## 2015-03-30 ENCOUNTER — Other Ambulatory Visit: Payer: Self-pay | Admitting: Internal Medicine

## 2015-04-04 ENCOUNTER — Other Ambulatory Visit: Payer: Self-pay | Admitting: Licensed Clinical Social Worker

## 2015-04-04 ENCOUNTER — Telehealth: Payer: Self-pay | Admitting: Licensed Clinical Social Worker

## 2015-04-04 DIAGNOSIS — F310 Bipolar disorder, current episode hypomanic: Secondary | ICD-10-CM

## 2015-04-04 MED ORDER — QUETIAPINE FUMARATE 300 MG PO TABS: 300.0000 mg | ORAL_TABLET | Freq: Every day | ORAL | Status: DC

## 2015-04-04 MED ORDER — QUETIAPINE FUMARATE 100 MG PO TABS: 100.0000 mg | ORAL_TABLET | Freq: Every day | ORAL | Status: DC

## 2015-04-04 NOTE — Telephone Encounter (Signed)
Patient called wanting a refill on Seroquel, she states that Alex from [facility] is looking in to finding a psychiatrist that she can afford. Patient wants Dr. Linus Salmons to refill in the meantime. She ran out Friday.

## 2015-04-04 NOTE — Telephone Encounter (Signed)
Ok to refill one month. Looks like 100 mg daily and 300 mg at night.

## 2015-04-07 ENCOUNTER — Telehealth: Payer: Self-pay | Admitting: Internal Medicine

## 2015-04-07 DIAGNOSIS — G609 Hereditary and idiopathic neuropathy, unspecified: Secondary | ICD-10-CM

## 2015-04-07 NOTE — Telephone Encounter (Signed)
Patient called wanting a referral to a pain management clinic at [address]

## 2015-04-11 NOTE — Telephone Encounter (Signed)
Referral entered. It may take several months to get in.

## 2015-04-17 ENCOUNTER — Observation Stay (HOSPITAL_COMMUNITY)
Admission: AD | Admit: 2015-04-17 | Discharge: 2015-04-18 | Disposition: A | Discharge status: Home / Self Care | Payer: Medicare Other | Source: Ambulatory Visit | Attending: Internal Medicine | Admitting: Internal Medicine

## 2015-04-17 ENCOUNTER — Encounter (HOSPITAL_COMMUNITY): Payer: Self-pay | Admitting: Anesthesiology

## 2015-04-17 ENCOUNTER — Inpatient Hospital Stay (HOSPITAL_COMMUNITY): Payer: Medicare Other

## 2015-04-17 DIAGNOSIS — N760 Acute vaginitis: Secondary | ICD-10-CM | POA: Diagnosis not present

## 2015-04-17 DIAGNOSIS — K219 Gastro-esophageal reflux disease without esophagitis: Secondary | ICD-10-CM | POA: Insufficient documentation

## 2015-04-17 DIAGNOSIS — B3731 Acute candidiasis of vulva and vagina: Secondary | ICD-10-CM | POA: Diagnosis present

## 2015-04-17 DIAGNOSIS — B379 Candidiasis, unspecified: Secondary | ICD-10-CM | POA: Diagnosis not present

## 2015-04-17 DIAGNOSIS — Z9114 Patient's other noncompliance with medication regimen: Secondary | ICD-10-CM | POA: Insufficient documentation

## 2015-04-17 DIAGNOSIS — F17210 Nicotine dependence, cigarettes, uncomplicated: Secondary | ICD-10-CM | POA: Diagnosis not present

## 2015-04-17 DIAGNOSIS — M170 Bilateral primary osteoarthritis of knee: Secondary | ICD-10-CM | POA: Insufficient documentation

## 2015-04-17 DIAGNOSIS — L304 Erythema intertrigo: Secondary | ICD-10-CM | POA: Insufficient documentation

## 2015-04-17 DIAGNOSIS — F332 Major depressive disorder, recurrent severe without psychotic features: Secondary | ICD-10-CM | POA: Insufficient documentation

## 2015-04-17 DIAGNOSIS — E785 Hyperlipidemia, unspecified: Secondary | ICD-10-CM | POA: Insufficient documentation

## 2015-04-17 DIAGNOSIS — I10 Essential (primary) hypertension: Secondary | ICD-10-CM | POA: Insufficient documentation

## 2015-04-17 DIAGNOSIS — K8020 Calculus of gallbladder without cholecystitis without obstruction: Secondary | ICD-10-CM | POA: Diagnosis not present

## 2015-04-17 DIAGNOSIS — B370 Candidal stomatitis: Secondary | ICD-10-CM | POA: Insufficient documentation

## 2015-04-17 DIAGNOSIS — E119 Type 2 diabetes mellitus without complications: Secondary | ICD-10-CM

## 2015-04-17 DIAGNOSIS — E780 Pure hypercholesterolemia: Secondary | ICD-10-CM | POA: Insufficient documentation

## 2015-04-17 DIAGNOSIS — E1165 Type 2 diabetes mellitus with hyperglycemia: Secondary | ICD-10-CM | POA: Diagnosis present

## 2015-04-17 DIAGNOSIS — J45909 Unspecified asthma, uncomplicated: Secondary | ICD-10-CM | POA: Insufficient documentation

## 2015-04-17 DIAGNOSIS — E860 Dehydration: Principal | ICD-10-CM | POA: Diagnosis present

## 2015-04-17 DIAGNOSIS — Z8673 Personal history of transient ischemic attack (TIA), and cerebral infarction without residual deficits: Secondary | ICD-10-CM | POA: Diagnosis not present

## 2015-04-17 DIAGNOSIS — B373 Candidiasis of vulva and vagina: Secondary | ICD-10-CM | POA: Diagnosis not present

## 2015-04-17 DIAGNOSIS — B372 Candidiasis of skin and nail: Secondary | ICD-10-CM | POA: Diagnosis not present

## 2015-04-17 DIAGNOSIS — N3289 Other specified disorders of bladder: Secondary | ICD-10-CM | POA: Diagnosis not present

## 2015-04-17 DIAGNOSIS — Z21 Asymptomatic human immunodeficiency virus [HIV] infection status: Secondary | ICD-10-CM | POA: Insufficient documentation

## 2015-04-17 DIAGNOSIS — R0602 Shortness of breath: Secondary | ICD-10-CM | POA: Diagnosis not present

## 2015-04-17 DIAGNOSIS — Z794 Long term (current) use of insulin: Secondary | ICD-10-CM | POA: Insufficient documentation

## 2015-04-17 DIAGNOSIS — R1084 Generalized abdominal pain: Secondary | ICD-10-CM | POA: Insufficient documentation

## 2015-04-17 DIAGNOSIS — Z6841 Body Mass Index (BMI) 40.0 and over, adult: Secondary | ICD-10-CM | POA: Diagnosis not present

## 2015-04-17 DIAGNOSIS — R109 Unspecified abdominal pain: Secondary | ICD-10-CM | POA: Diagnosis present

## 2015-04-17 DIAGNOSIS — F419 Anxiety disorder, unspecified: Secondary | ICD-10-CM | POA: Insufficient documentation

## 2015-04-17 DIAGNOSIS — R739 Hyperglycemia, unspecified: Secondary | ICD-10-CM | POA: Diagnosis present

## 2015-04-17 DIAGNOSIS — B9689 Other specified bacterial agents as the cause of diseases classified elsewhere: Secondary | ICD-10-CM | POA: Diagnosis present

## 2015-04-17 DIAGNOSIS — B20 Human immunodeficiency virus [HIV] disease: Secondary | ICD-10-CM | POA: Diagnosis present

## 2015-04-17 LAB — CBG MONITORING, ED
GLUCOSE-CAPILLARY: 554 mg/dL — AB (ref 65–99)
GLUCOSE-CAPILLARY: 366 mg/dL — AB (ref 65–99)
Glucose-Capillary: 283 mg/dL — ABNORMAL HIGH (ref 65–99)

## 2015-04-17 LAB — CBC WITH DIFFERENTIAL/PLATELET
BASOS PCT: 1 % (ref 0–1)
Basophils Absolute: 0 10*3/uL (ref 0.0–0.1)
Eosinophils Absolute: 0.3 10*3/uL (ref 0.0–0.7)
Eosinophils Relative: 4 % (ref 0–5)
HCT: 42.4 % (ref 36.0–46.0)
Hemoglobin: 14.9 g/dL (ref 12.0–15.0)
LYMPHS ABS: 2.1 10*3/uL (ref 0.7–4.0)
Lymphocytes Relative: 34 % (ref 12–46)
MCH: 31.2 pg (ref 26.0–34.0)
MCHC: 35.1 g/dL (ref 30.0–36.0)
MCV: 88.9 fL (ref 78.0–100.0)
Monocytes Absolute: 0.4 10*3/uL (ref 0.1–1.0)
Monocytes Relative: 6 % (ref 3–12)
Neutro Abs: 3.3 10*3/uL (ref 1.7–7.7)
Neutrophils Relative %: 55 % (ref 43–77)
PLATELETS: 239 10*3/uL (ref 150–400)
RBC: 4.77 MIL/uL (ref 3.87–5.11)
RDW: 13.6 % (ref 11.5–15.5)
WBC: 6 10*3/uL (ref 4.0–10.5)

## 2015-04-17 LAB — URINALYSIS, ROUTINE W REFLEX MICROSCOPIC
Bilirubin Urine: NEGATIVE
Glucose, UA: >1000 mg/dL — AB
HGB URINE DIPSTICK: NEGATIVE
KETONES UR: 15 mg/dL — AB
Leukocytes, UA: NEGATIVE
Nitrite: NEGATIVE
PROTEIN: NEGATIVE mg/dL
SPECIFIC GRAVITY, URINE: 1.01 (ref 1.005–1.030)
Urobilinogen, UA: 0.2 mg/dL (ref 0.0–1.0)
pH: 5.5 (ref 5.0–8.0)

## 2015-04-17 LAB — COMPREHENSIVE METABOLIC PANEL
ALT: 16 U/L (ref 14–54)
AST: 15 U/L (ref 15–41)
Albumin: 3.8 g/dL (ref 3.5–5.0)
Alkaline Phosphatase: 129 U/L — ABNORMAL HIGH (ref 38–126)
Anion gap: 12 (ref 5–15)
BILIRUBIN TOTAL: 0.3 mg/dL (ref 0.3–1.2)
BUN: 8 mg/dL (ref 6–20)
CHLORIDE: 96 mmol/L — AB (ref 101–111)
CO2: 24 mmol/L (ref 22–32)
CREATININE: 1.08 mg/dL — AB (ref 0.44–1.00)
Calcium: 9.1 mg/dL (ref 8.9–10.3)
GFR calc Af Amer: >60 mL/min (ref >60)
GFR calc non Af Amer: 58 mL/min — ABNORMAL LOW (ref >60)
GLUCOSE: 643 mg/dL — AB (ref 65–99)
POTASSIUM: 4 mmol/L (ref 3.5–5.1)
Sodium: 132 mmol/L — ABNORMAL LOW (ref 135–145)
TOTAL PROTEIN: 7.2 g/dL (ref 6.5–8.1)

## 2015-04-17 LAB — I-STAT CHEM 8, ED
BUN: 5 mg/dL — AB (ref 6–20)
CREATININE: 0.8 mg/dL (ref 0.44–1.00)
Calcium, Ion: 1.16 mmol/L (ref 1.12–1.23)
Chloride: 98 mmol/L — ABNORMAL LOW (ref 101–111)
GLUCOSE: 427 mg/dL — AB (ref 65–99)
HCT: 44 % (ref 36.0–46.0)
Hemoglobin: 15 g/dL (ref 12.0–15.0)
Potassium: 3.6 mmol/L (ref 3.5–5.1)
Sodium: 134 mmol/L — ABNORMAL LOW (ref 135–145)
TCO2: 23 mmol/L (ref 0–100)

## 2015-04-17 LAB — GLUCOSE, CAPILLARY
Glucose-Capillary: >600 mg/dL (ref 65–99)
Glucose-Capillary: 371 mg/dL — ABNORMAL HIGH (ref 65–99)

## 2015-04-17 LAB — I-STAT VENOUS BLOOD GAS, ED
Bicarbonate: 25.9 mEq/L — ABNORMAL HIGH (ref 20.0–24.0)
O2 Saturation: 71 %
PH VEN: 7.348 — AB (ref 7.250–7.300)
TCO2: 27 mmol/L (ref 0–100)
pCO2, Ven: 47.2 mmHg (ref 45.0–50.0)
pO2, Ven: 40 mmHg (ref 30.0–45.0)

## 2015-04-17 LAB — WET PREP, GENITAL
TRICH WET PREP: NONE SEEN
YEAST WET PREP: NONE SEEN

## 2015-04-17 LAB — I-STAT CG4 LACTIC ACID, ED
LACTIC ACID, VENOUS: 1.1 mmol/L (ref 0.5–2.0)
Lactic Acid, Venous: 1.47 mmol/L (ref 0.5–2.0)

## 2015-04-17 LAB — URINE MICROSCOPIC-ADD ON

## 2015-04-17 LAB — LIPASE, BLOOD: Lipase: 14 U/L — ABNORMAL LOW (ref 22–51)

## 2015-04-17 LAB — POCT PREGNANCY, URINE: PREG TEST UR: NEGATIVE

## 2015-04-17 IMAGING — CT CT ABD-PELV W/ CM
2 of 5 series · 8 of 46 positions shown, 9 images · IV contrast (omnipaque)
Comparison: None.

CLINICAL DATA: Generalized abdominal pain with nausea and vomiting
for 2 days

EXAM:
CT ABDOMEN AND PELVIS WITH CONTRAST
TECHNIQUE: Multidetector CT imaging of the abdomen and pelvis was performed
using the standard protocol following bolus administration of
intravenous contrast.
CONTRAST:  100mL OMNIPAQUE IOHEXOL 300 MG/ML  SOLN

[Series 201: routine, idose (2) · axial · 0.98mm/px · z∈[-957,-557]mm · 5 of 104 slices shown, 6 images]
[im 12/104  soft-tissue]
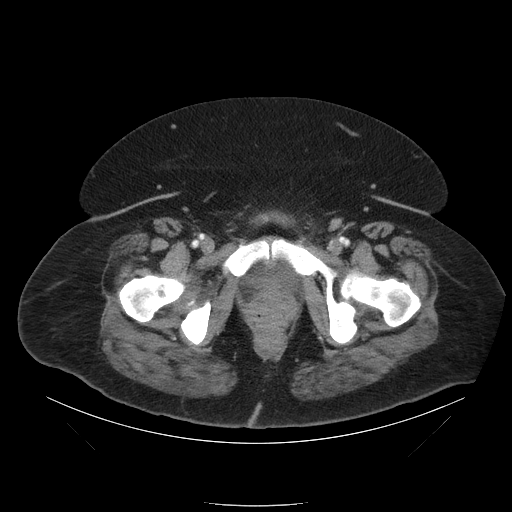
[im 12/104  bone]
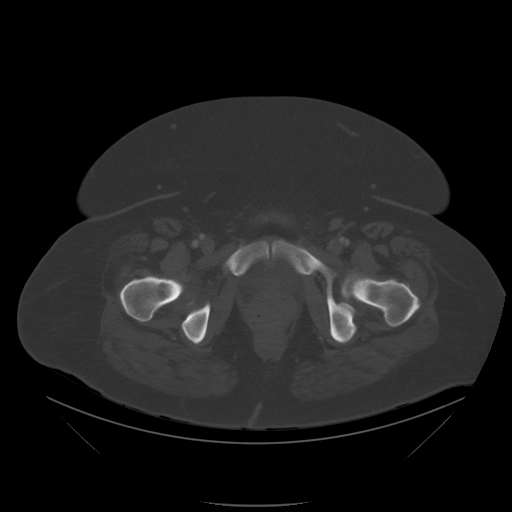
[im 35/104  soft-tissue]
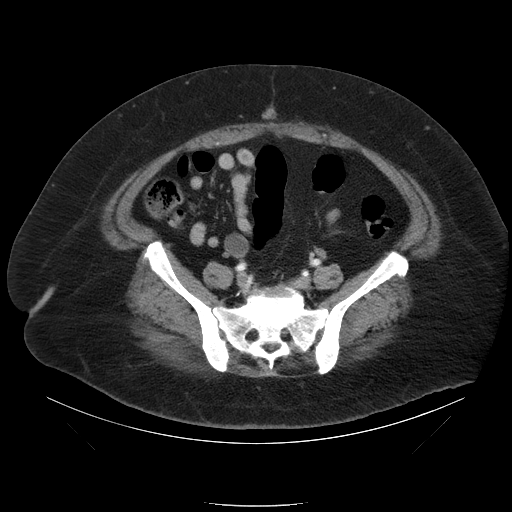
[im 52/104  soft-tissue]
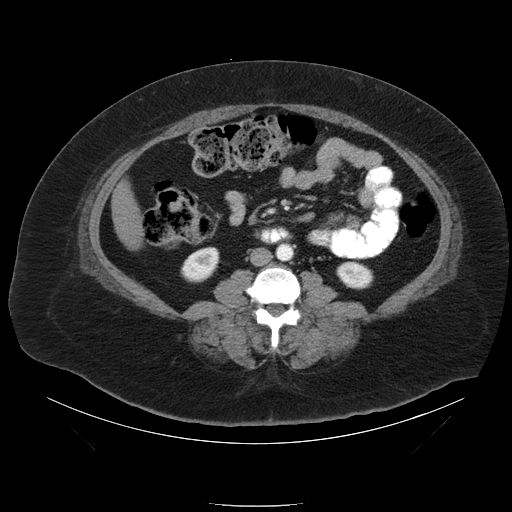
[im 69/104  soft-tissue]
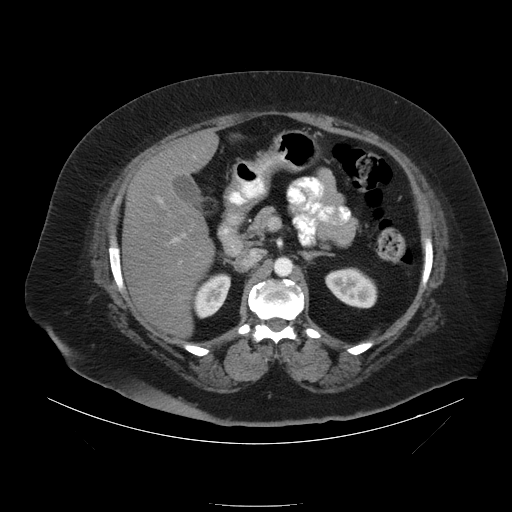
[im 92/104  soft-tissue]
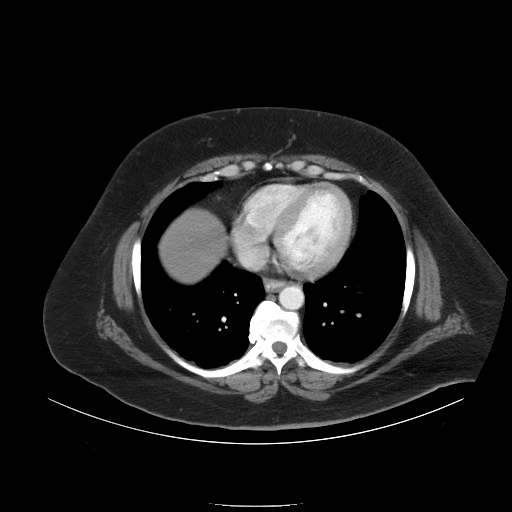

[Series 203: coronals, idose (2) · coronal · 0.45mm/px · 3 of 160 slices shown]
[im 54/160  soft-tissue]
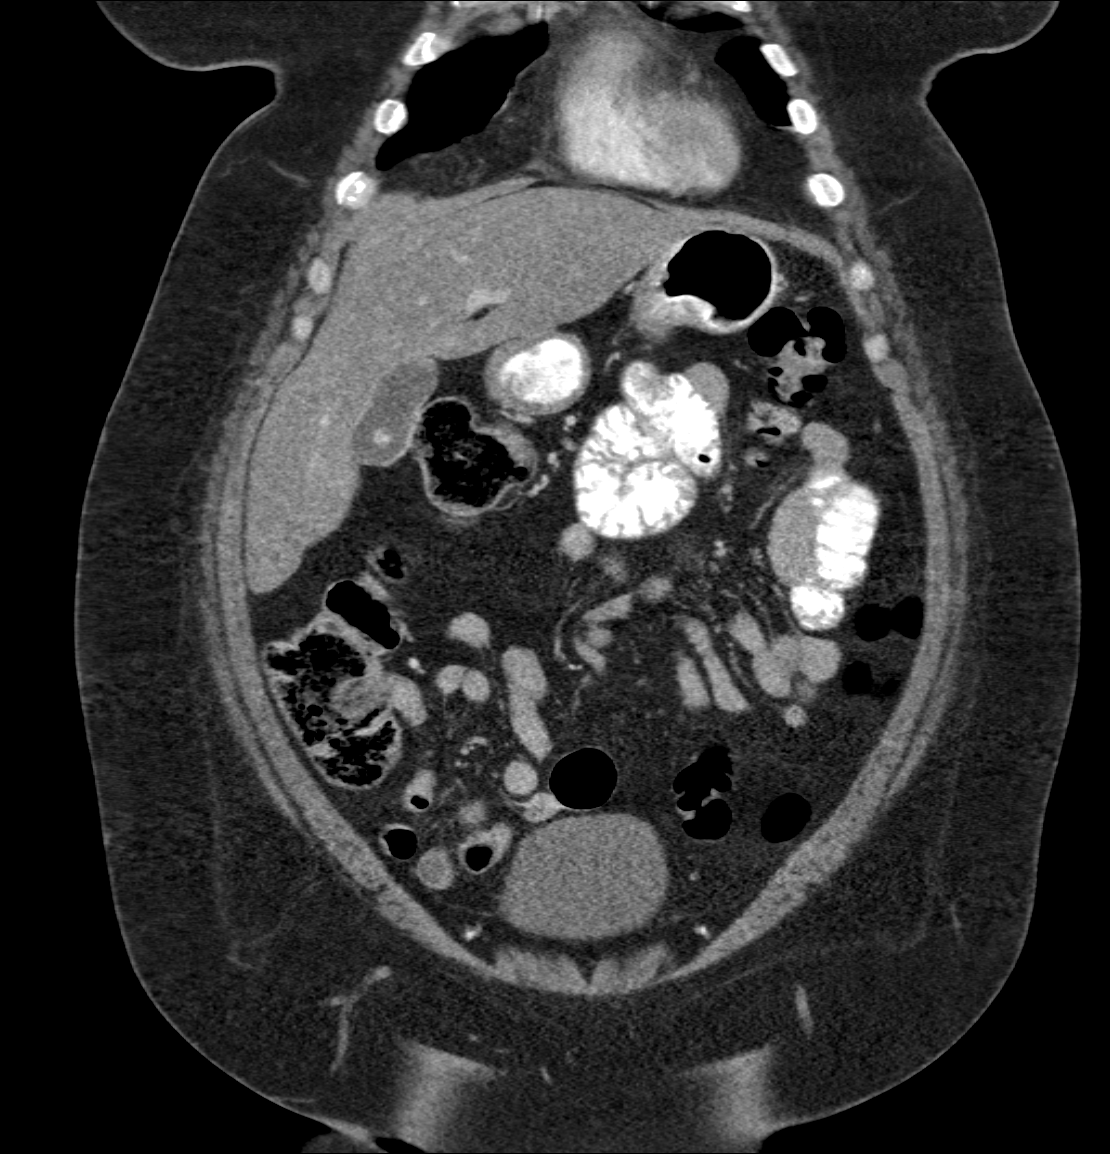
[im 71/160  soft-tissue]
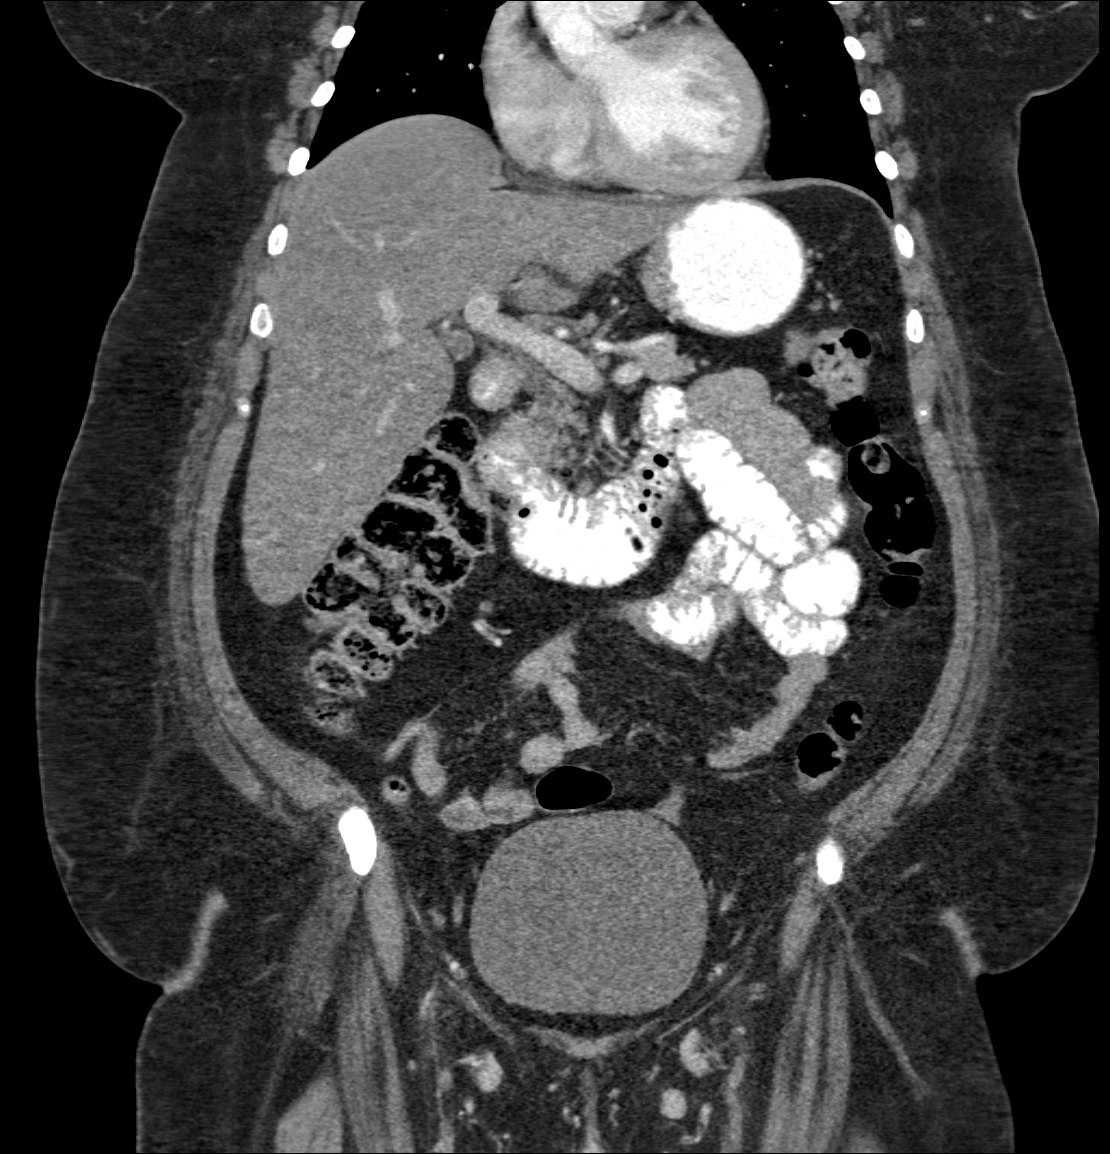
[im 89/160  soft-tissue]
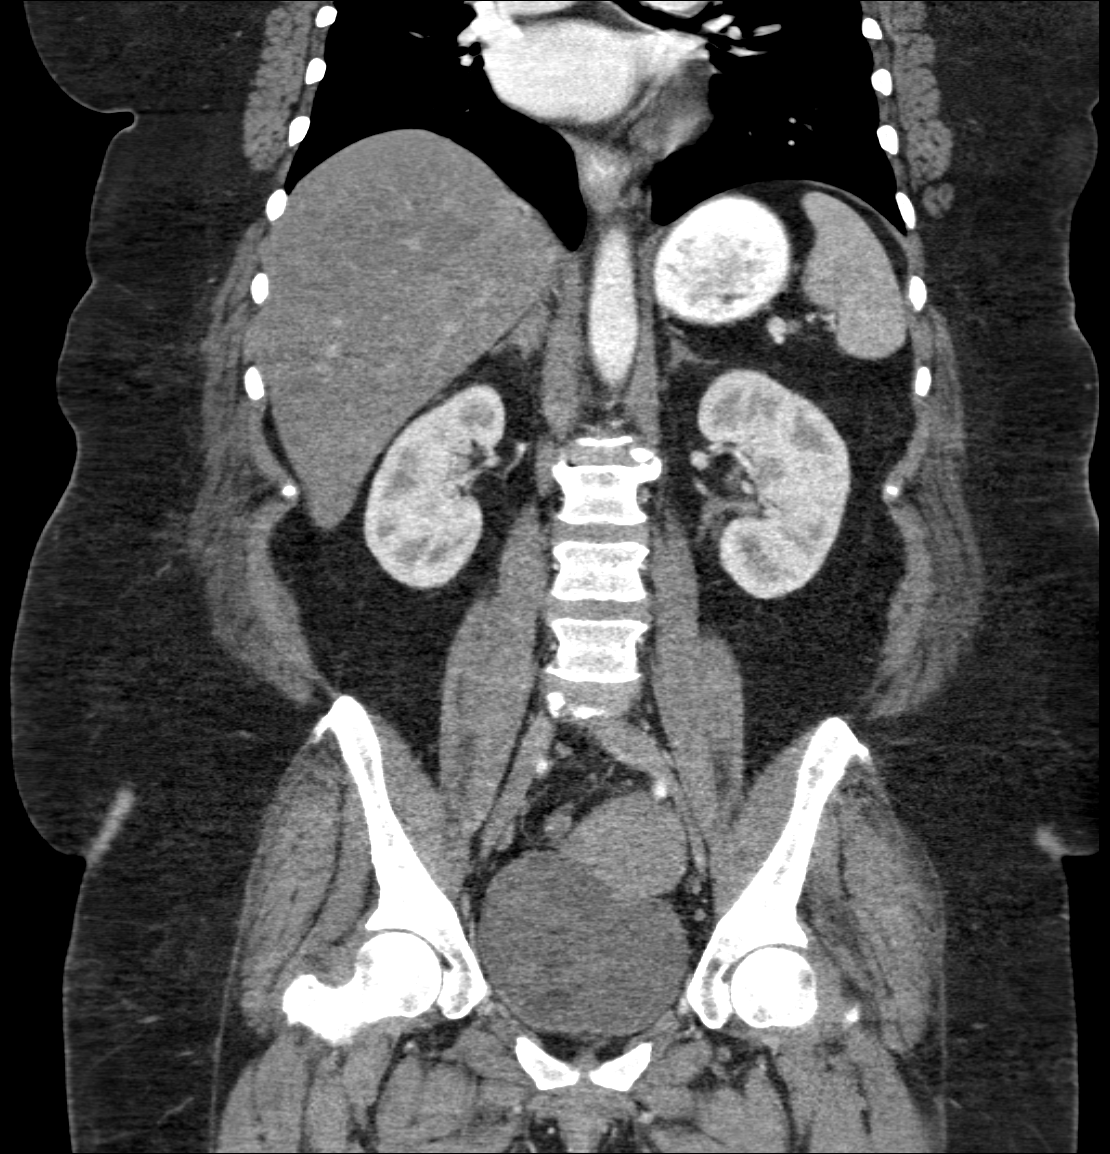

[8 of 46 positions shown; findings below may reference images not displayed]

FINDINGS: Lung bases are free of acute infiltrate or sizable effusion. A
calcified granuloma is identified in the right lung base
posteriorly.

The liver is diffusely fatty infiltrated. A single gallstone is
noted without gallbladder wall thickening or biliary ductal
dilatation. The spleen, adrenal glands and pancreas are within
normal limits. The kidneys are well visualized bilaterally and
demonstrate a normal enhancement pattern. No renal calculi or
obstructive changes are seen. Normal excretion of contrast is noted.

The bladder is well distended. The uterus and ovaries demonstrate
evidence of right ovarian cystic change. The appendix is within
normal limits. No acute bony abnormality is noted.
IMPRESSION: Chronic changes as described above. No acute abnormality to
correspond with the patient's clinical symptomatology is noted.

## 2015-04-17 IMAGING — CR DG CHEST 2V
2 series · 2 of 2 positions shown · non-contrast
Comparison: [DATE]

CLINICAL DATA: Shortness of breath.

EXAM:
CHEST  2 VIEW

[chest pa]
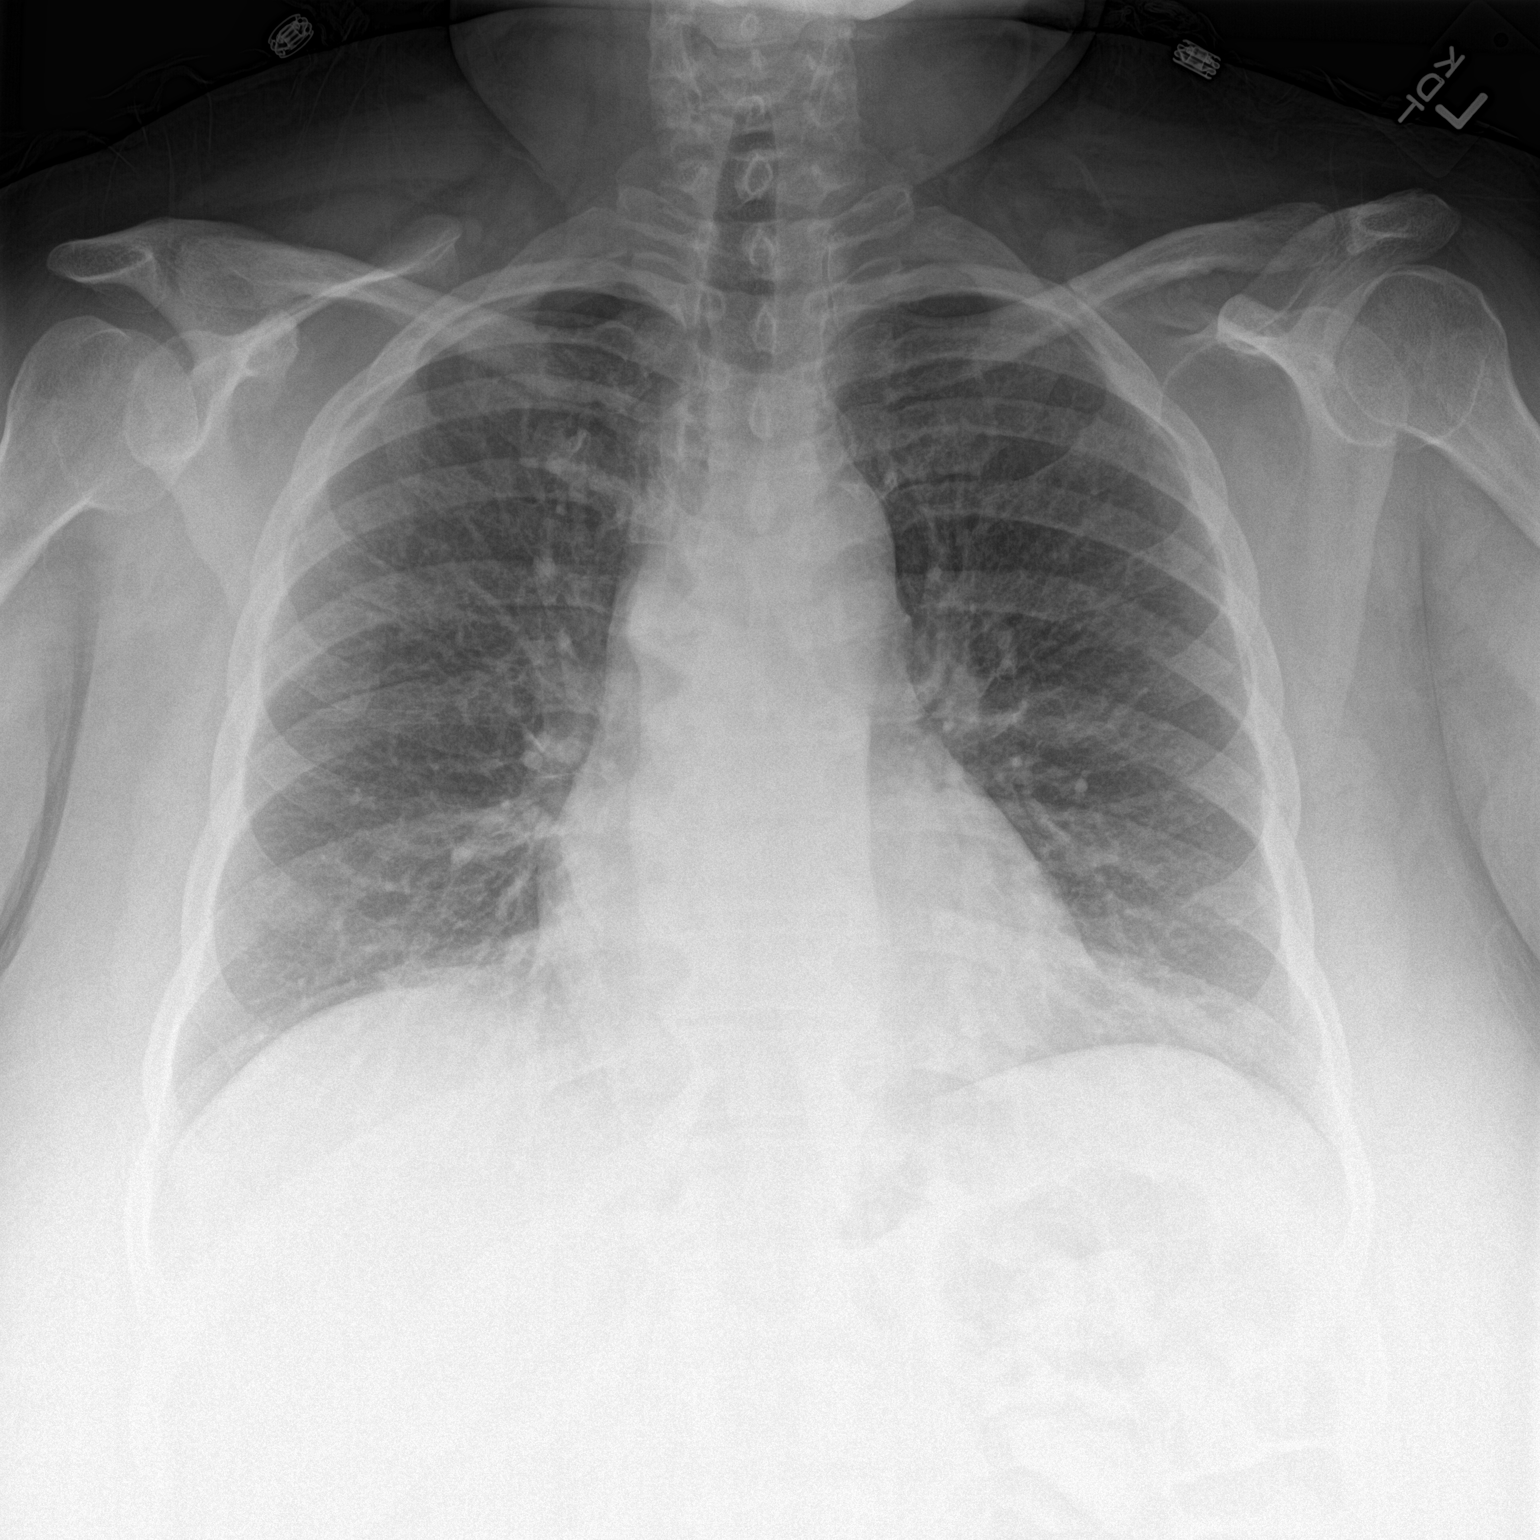

[chest lat]
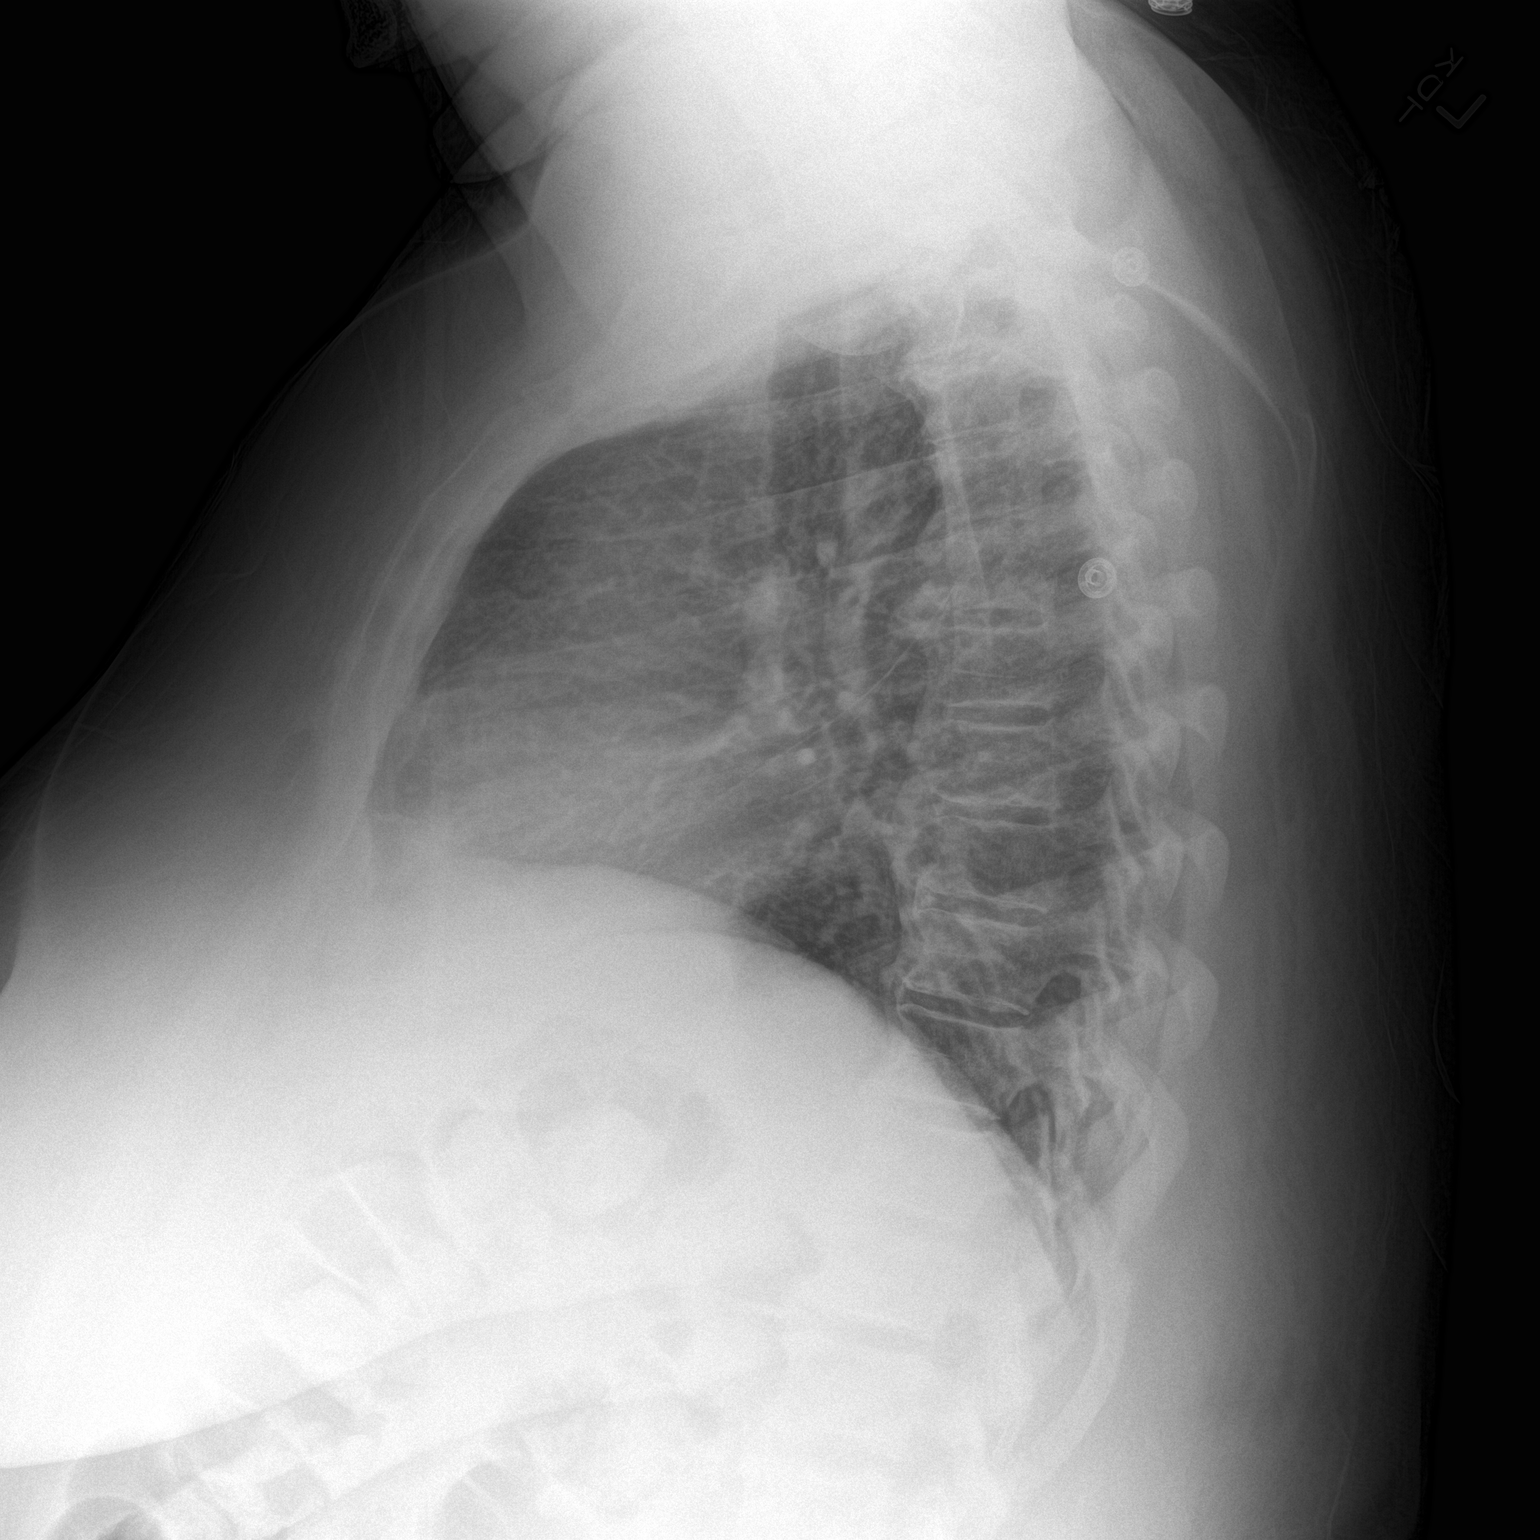

[2 of 2 positions shown; findings below may reference images not displayed]

FINDINGS: The heart size and mediastinal contours are within normal limits.
Both lungs are clear. The visualized skeletal structures are
unremarkable.
IMPRESSION: Normal chest.

## 2015-04-17 MED ORDER — SODIUM CHLORIDE 0.9 % IV BOLUS (SEPSIS)
1000.0000 mL | Freq: Once | INTRAVENOUS | Status: AC
  Administered 2015-04-17: 1000 mL via INTRAVENOUS

## 2015-04-17 MED ORDER — IOHEXOL 300 MG/ML  SOLN
100.0000 mL | Freq: Once | INTRAMUSCULAR | Status: AC | PRN
  Administered 2015-04-17: 100 mL via INTRAVENOUS

## 2015-04-17 MED ORDER — ATENOLOL 25 MG PO TABS
25.0000 mg | ORAL_TABLET | Freq: Every day | ORAL | Status: DC
  Administered 2015-04-17: 25 mg via ORAL

## 2015-04-17 MED ORDER — INSULIN ASPART PROT & ASPART (70-30 MIX) 100 UNIT/ML ~~LOC~~ SUSP
20.0000 [IU] | Freq: Two times a day (BID) | SUBCUTANEOUS | Status: DC
  Administered 2015-04-17 – 2015-04-18 (×3): 20 [IU] via SUBCUTANEOUS
  Filled 2015-04-17: qty 10

## 2015-04-17 MED ORDER — ALBUTEROL SULFATE (2.5 MG/3ML) 0.083% IN NEBU
5.0000 mg | INHALATION_SOLUTION | Freq: Once | RESPIRATORY_TRACT | Status: AC
  Administered 2015-04-17: 5 mg via RESPIRATORY_TRACT
  Filled 2015-04-17: qty 6

## 2015-04-17 MED ORDER — ENOXAPARIN SODIUM 40 MG/0.4ML ~~LOC~~ SOLN
40.0000 mg | SUBCUTANEOUS | Status: DC
  Administered 2015-04-17: 40 mg via SUBCUTANEOUS
  Filled 2015-04-17: qty 0.4

## 2015-04-17 MED ORDER — IPRATROPIUM BROMIDE 0.02 % IN SOLN
0.5000 mg | Freq: Once | RESPIRATORY_TRACT | Status: AC
  Administered 2015-04-17: 0.5 mg via RESPIRATORY_TRACT
  Filled 2015-04-17: qty 2.5

## 2015-04-17 MED ORDER — CITALOPRAM HYDROBROMIDE 20 MG PO TABS
40.0000 mg | ORAL_TABLET | Freq: Every day | ORAL | Status: DC
  Administered 2015-04-17: 40 mg via ORAL
  Filled 2015-04-17: qty 2

## 2015-04-17 MED ORDER — ONDANSETRON HCL 4 MG/2ML IJ SOLN: 4.0000 mg | Freq: Four times a day (QID) | INTRAMUSCULAR | Status: DC | PRN

## 2015-04-17 MED ORDER — POTASSIUM CHLORIDE IN NACL 20-0.9 MEQ/L-% IV SOLN
INTRAVENOUS | Status: DC
  Administered 2015-04-17 – 2015-04-18 (×2): via INTRAVENOUS
  Filled 2015-04-17 (×3): qty 1000

## 2015-04-17 MED ORDER — METRONIDAZOLE 500 MG PO TABS
500.0000 mg | ORAL_TABLET | Freq: Two times a day (BID) | ORAL | Status: DC
  Administered 2015-04-17 – 2015-04-18 (×2): 500 mg via ORAL
  Filled 2015-04-17 (×2): qty 1

## 2015-04-17 MED ORDER — SODIUM CHLORIDE 0.9 % IV SOLN
INTRAVENOUS | Status: DC
  Filled 2015-04-17: qty 2.5

## 2015-04-17 MED ORDER — NYSTATIN 100000 UNIT/GM EX POWD
Freq: Two times a day (BID) | CUTANEOUS | Status: DC
  Administered 2015-04-17 – 2015-04-18 (×2): via TOPICAL
  Filled 2015-04-17: qty 15

## 2015-04-17 MED ORDER — QUETIAPINE FUMARATE 100 MG PO TABS
100.0000 mg | ORAL_TABLET | Freq: Every day | ORAL | Status: DC
  Administered 2015-04-18: 100 mg via ORAL
  Filled 2015-04-17: qty 1

## 2015-04-17 MED ORDER — PANTOPRAZOLE SODIUM 40 MG PO TBEC
40.0000 mg | DELAYED_RELEASE_TABLET | Freq: Every day | ORAL | Status: DC
  Administered 2015-04-17 – 2015-04-18 (×2): 40 mg via ORAL
  Filled 2015-04-17 (×2): qty 1

## 2015-04-17 MED ORDER — HYDROXYZINE HCL 25 MG PO TABS: 25.0000 mg | ORAL_TABLET | Freq: Three times a day (TID) | ORAL | Status: DC | PRN

## 2015-04-17 MED ORDER — ALUM & MAG HYDROXIDE-SIMETH 200-200-20 MG/5ML PO SUSP
30.0000 mL | Freq: Four times a day (QID) | ORAL | Status: DC | PRN
Start: 2015-04-17 — End: 2015-04-18

## 2015-04-17 MED ORDER — SENNOSIDES-DOCUSATE SODIUM 8.6-50 MG PO TABS: 1.0000 | ORAL_TABLET | Freq: Every evening | ORAL | Status: DC | PRN

## 2015-04-17 MED ORDER — SIMVASTATIN 40 MG PO TABS: 40.0000 mg | ORAL_TABLET | Freq: Every day | ORAL | Status: DC

## 2015-04-17 MED ORDER — MORPHINE SULFATE 4 MG/ML IJ SOLN
4.0000 mg | Freq: Once | INTRAMUSCULAR | Status: AC
  Administered 2015-04-17: 4 mg via INTRAVENOUS
  Filled 2015-04-17: qty 1

## 2015-04-17 MED ORDER — GABAPENTIN 300 MG PO CAPS
300.0000 mg | ORAL_CAPSULE | Freq: Every evening | ORAL | Status: DC
  Administered 2015-04-17: 300 mg via ORAL
  Filled 2015-04-17: qty 1

## 2015-04-17 MED ORDER — INSULIN REGULAR HUMAN 100 UNIT/ML IJ SOLN
10.0000 [IU] | Freq: Once | INTRAMUSCULAR | Status: AC
  Administered 2015-04-17: 10 [IU] via SUBCUTANEOUS
  Filled 2015-04-17: qty 1

## 2015-04-17 MED ORDER — NYSTATIN 100000 UNIT/ML MT SUSP
5.0000 mL | Freq: Four times a day (QID) | OROMUCOSAL | Status: DC
  Administered 2015-04-17 – 2015-04-18 (×2): 500000 [IU] via ORAL
  Filled 2015-04-17 (×2): qty 5

## 2015-04-17 MED ORDER — SODIUM CHLORIDE 0.9 % IV SOLN
INTRAVENOUS | Status: DC
Start: 2015-04-17 — End: 2015-04-18

## 2015-04-17 MED ORDER — ACETAMINOPHEN 650 MG RE SUPP: 650.0000 mg | Freq: Four times a day (QID) | RECTAL | Status: DC | PRN

## 2015-04-17 MED ORDER — ELVITEG-COBIC-EMTRICIT-TENOFAF 150-150-200-10 MG PO TABS
1.0000 | ORAL_TABLET | Freq: Every day | ORAL | Status: DC
  Administered 2015-04-18: 1 via ORAL
  Filled 2015-04-17 (×2): qty 1

## 2015-04-17 MED ORDER — HYDROCODONE-ACETAMINOPHEN 5-325 MG PO TABS
1.0000 | ORAL_TABLET | Freq: Four times a day (QID) | ORAL | Status: DC | PRN
  Administered 2015-04-17 – 2015-04-18 (×2): 1 via ORAL
  Filled 2015-04-17 (×2): qty 1

## 2015-04-17 MED ORDER — QUETIAPINE FUMARATE 100 MG PO TABS
300.0000 mg | ORAL_TABLET | Freq: Every day | ORAL | Status: DC
  Administered 2015-04-17: 300 mg via ORAL
  Filled 2015-04-17: qty 3

## 2015-04-17 MED ORDER — OXYCODONE-ACETAMINOPHEN 5-325 MG PO TABS
2.0000 | ORAL_TABLET | Freq: Once | ORAL | Status: AC
  Administered 2015-04-17: 2 via ORAL
  Filled 2015-04-17: qty 2

## 2015-04-17 MED ORDER — FLUCONAZOLE 150 MG PO TABS
150.0000 mg | ORAL_TABLET | Freq: Once | ORAL | Status: AC
  Administered 2015-04-17: 150 mg via ORAL
  Filled 2015-04-17: qty 1

## 2015-04-17 MED ORDER — DOCUSATE SODIUM 100 MG PO CAPS
100.0000 mg | ORAL_CAPSULE | Freq: Two times a day (BID) | ORAL | Status: DC
  Administered 2015-04-17 – 2015-04-18 (×2): 100 mg via ORAL
  Filled 2015-04-17 (×2): qty 1

## 2015-04-17 MED ORDER — LORATADINE 10 MG PO TABS
10.0000 mg | ORAL_TABLET | Freq: Every day | ORAL | Status: DC
  Administered 2015-04-18: 10 mg via ORAL
  Filled 2015-04-17: qty 1

## 2015-04-17 MED ORDER — ATORVASTATIN CALCIUM 20 MG PO TABS
20.0000 mg | ORAL_TABLET | Freq: Every day | ORAL | Status: DC
  Administered 2015-04-17: 20 mg via ORAL
  Filled 2015-04-17: qty 1

## 2015-04-17 MED ORDER — ONDANSETRON HCL 4 MG PO TABS: 4.0000 mg | ORAL_TABLET | Freq: Four times a day (QID) | ORAL | Status: DC | PRN

## 2015-04-17 MED ORDER — ACETAMINOPHEN 325 MG PO TABS: 650.0000 mg | ORAL_TABLET | Freq: Four times a day (QID) | ORAL | Status: DC | PRN

## 2015-04-17 NOTE — ED Notes (Signed)
Patient returned from X-ray 
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
## 2015-04-17 NOTE — ED Notes (Signed)
Meal tray ordered 

## 2015-04-17 NOTE — ED Notes (Signed)
Patient transported to CT 

## 2015-04-17 NOTE — ED Notes (Signed)
Arrived to E38 from [facility] at 1219. Out of DM medications and insulin for 1.5 weeks. CBG 554 on arrival to [facility]. Given 10u insulin at womens and 1L NS infusing at this time. Reports lower abominal pain and back pain.
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
## 2015-04-17 NOTE — MAU Note (Signed)
Pt says that she thinks she has thrush in her mouth, a yeast infection or UTI.  Has pain in her mouth and abdomen.

## 2015-04-17 NOTE — ED Notes (Signed)
Patient returned from CT

## 2015-04-17 NOTE — ED Notes (Signed)
Patient transported to X-ray 

## 2015-04-17 NOTE — ED Notes (Signed)
Attempted report 
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
## 2015-04-17 NOTE — ED Notes (Signed)
Notified phelbotomy that patient back from CT; they are at bedside drawing labs at this time.

## 2015-04-18 ENCOUNTER — Encounter (HOSPITAL_COMMUNITY): Payer: Self-pay | Admitting: *Deleted

## 2015-04-18 DIAGNOSIS — E1165 Type 2 diabetes mellitus with hyperglycemia: Secondary | ICD-10-CM | POA: Diagnosis not present

## 2015-04-18 DIAGNOSIS — E860 Dehydration: Secondary | ICD-10-CM | POA: Diagnosis not present

## 2015-04-18 DIAGNOSIS — R739 Hyperglycemia, unspecified: Secondary | ICD-10-CM | POA: Diagnosis not present

## 2015-04-18 DIAGNOSIS — N760 Acute vaginitis: Secondary | ICD-10-CM | POA: Diagnosis not present

## 2015-04-18 DIAGNOSIS — A499 Bacterial infection, unspecified: Secondary | ICD-10-CM

## 2015-04-18 DIAGNOSIS — R109 Unspecified abdominal pain: Secondary | ICD-10-CM | POA: Diagnosis present

## 2015-04-18 DIAGNOSIS — Z794 Long term (current) use of insulin: Secondary | ICD-10-CM | POA: Diagnosis present

## 2015-04-18 LAB — BASIC METABOLIC PANEL
Anion gap: 8 (ref 5–15)
BUN: <5 mg/dL — ABNORMAL LOW (ref 6–20)
CALCIUM: 8.2 mg/dL — AB (ref 8.9–10.3)
CO2: 25 mmol/L (ref 22–32)
CREATININE: 0.76 mg/dL (ref 0.44–1.00)
Chloride: 102 mmol/L (ref 101–111)
Glucose, Bld: 314 mg/dL — ABNORMAL HIGH (ref 65–99)
Potassium: 3.4 mmol/L — ABNORMAL LOW (ref 3.5–5.1)
SODIUM: 135 mmol/L (ref 135–145)

## 2015-04-18 LAB — GLUCOSE, CAPILLARY
GLUCOSE-CAPILLARY: 247 mg/dL — AB (ref 65–99)
Glucose-Capillary: 269 mg/dL — ABNORMAL HIGH (ref 65–99)

## 2015-04-18 LAB — MRSA PCR SCREENING: MRSA by PCR: NEGATIVE

## 2015-04-18 LAB — GC/CHLAMYDIA PROBE AMP (~~LOC~~) NOT AT ARMC
CHLAMYDIA, DNA PROBE: NEGATIVE
NEISSERIA GONORRHEA: NEGATIVE

## 2015-04-18 MED ORDER — INSULIN ASPART PROT & ASPART (70-30 MIX) 100 UNIT/ML ~~LOC~~ SUSP: 20.0000 [IU] | Freq: Two times a day (BID) | SUBCUTANEOUS | Status: DC

## 2015-04-18 MED ORDER — METRONIDAZOLE 500 MG PO TABS: 500.0000 mg | ORAL_TABLET | Freq: Two times a day (BID) | ORAL | Status: AC

## 2015-04-18 MED ORDER — POTASSIUM CHLORIDE CRYS ER 20 MEQ PO TBCR
40.0000 meq | EXTENDED_RELEASE_TABLET | Freq: Once | ORAL | Status: AC
  Administered 2015-04-18: 40 meq via ORAL
  Filled 2015-04-18: qty 2

## 2015-04-18 MED ORDER — NYSTATIN 100000 UNIT/ML MT SUSP: 5.0000 mL | Freq: Four times a day (QID) | OROMUCOSAL | Status: AC

## 2015-04-18 MED ORDER — TRAMADOL-ACETAMINOPHEN 37.5-325 MG PO TABS: 2.0000 | ORAL_TABLET | Freq: Three times a day (TID) | ORAL | Status: DC | PRN

## 2015-04-18 MED ORDER — NYSTATIN 100000 UNIT/GM EX POWD: CUTANEOUS | Status: AC

## 2015-04-18 NOTE — Discharge Summary (Addendum)
Physician Discharge Summary  CELLA CAPPELLO HQI:[id number] DOB: [birth year] DOA: 04/17/2015  PCP: Gwendolyn Grant, MD  Admit date: 04/17/2015 Discharge date: 04/18/2015  Time spent: 30 minutes  Recommendations for Outpatient Follow-up:  1. Discharge home with outpt PCP follow up in 1 week. Follow up with endocrinologist in 2-3 weeks 2.  patient to complete 7 days of oral flagyl on 8/20 3.   Discharge Diagnoses:  Principal Problem:   Dehydration   Active Problems:   HIV disease   Hypertension   Morbid obesity   Dyslipidemia   GERD (gastroesophageal reflux disease)   Diabetes mellitus type 2 without retinopathy   MDD (major depressive disorder), recurrent severe, without psychosis   Hyperglycemia   Oral thrush   Bacterial vaginitis   Candidal vaginitis   Intertrigo   Diabetes type 2, uncontrolled   Abdominal pain   Discharge Condition: fair  Diet recommendation: diabetic  Filed Weights   04/17/15 1026 04/17/15 1950  Weight: 122.018 kg (269 lb) 124.286 kg (274 lb)    History of present illness:  54 year old female with uncontrolled diabetes mellitus, HIV on ART, depression, morbidly obese, hypertension and hyperlipidemia initially presented to women's health with vaginal discharge and found to have vaginal yeast infection. Her wet prep showed no cells and was reportedly given a dose of Diflucan. Also was found to have blood glucose of 643 with normal anion gap and given 10 units subcutaneous NovoLog, 1 L 5 normal saline and sent to was his [facility] ED. She received another liter of IV normal saline bolus and CBG was 283. She also complained of abdominal pain diffusely. A CT of the abdomen and pelvis was unremarkable. As per patient she lost her insulin when she moved out of her apartment pool until about a week back. She has been having polyuria, polydipsia with abdominal pain and vomiting. Also has burning urination.  Patient admitted on observation for abdominal pain,  dehydration and severe hyperglycemia.  Hospital Course:   Principal problem Acute dehydration Symptoms did not improve with 2 L normal saline bolus. Admitted under alteration to monitor with IV hydration. Symptoms have improved this morning has mild abdominal discomfort but no nausea or vomiting.  Uncontrolled type 2 diabetes mellitus with hyperglycemia Patient missed her insulin for almost a week. Resumed her home dose humalog. FSG is improved this morning. A1c sent and should be followed up as outpatient. She follows up with her endocrinologist Dr. Loanne Drilling and advised to arrange outpatient follow-up in 2-3 weeks. I have given her prescriptions for humalog (75-25) 20 units twice daily. Instructed to keep a log of her blood glucose and monitor her diet. Also instructed on compliance with insulin.  Bacterial vaginitis Treating with a seven-day course of Flagyl 500 mg twice daily.  Candidal vaginitis Received one dose of Diflucan at [facility]. Patient also has oral thrush. Have prescribed a ten-day course of nystatin.  Cutaneous candidiasis Involvement daily as underneath her breast and groin. Nystatin powder prescribed.  Essential hypertension Stable. Continue home medications  Severe depression On Celexa on high-dose Seroquel (reports being pressured by her ID physician and is in the process of getting  a psychiatrist)   HIV Follows with Dr. Linus Salmons. continue ART.  Morbid obesity Counseled on diet and excised to loose weight.  Diet: Diabetic  Family communication: None at bedside  Disposition: Home with outpatient follow-up   Procedures:  CT abdomen and pelvis  Consultations:  None  Discharge Exam: Filed Vitals:   04/18/15 0640  BP: 130/74  Pulse: 70  Temp: 98.4 F (36.9 C)  Resp: 17    General: Middle aged morbidly obese female in no acute distress HEENT: No pallor, moist oral mucosa, supple neck, oral thrush Chest: Clear to auscultation  bilaterally CVS: Normal S1 and S2, no murmur GI: Soft, nondistended, nontender, bowel sounds present Musculoskeletal: Warm, no edema, patchy fungal lesions underneath the breast and groin folds CNS: Alert and oriented  Discharge Instructions    Current Discharge Medication List    START taking these medications   Details  metroNIDAZOLE (FLAGYL) 500 MG tablet Take 1 tablet (500 mg total) by mouth every 12 (twelve) hours. Qty: 12 tablet, Refills: 0    nystatin (MYCOSTATIN) 100000 UNIT/ML suspension Take 5 mLs (500,000 Units total) by mouth 4 (four) times daily. Qty: 240 mL, Refills: 0    nystatin (MYCOSTATIN/NYSTOP) 100000 UNIT/GM POWD 1 application underneath the breast and groin twice daily Qty: 1 Bottle, Refills: 0    traMADol-acetaminophen (ULTRACET) 37.5-325 MG per tablet Take 2 tablets by mouth every 8 (eight) hours as needed for moderate pain. Qty: 15 tablet, Refills: 0      CONTINUE these medications which have CHANGED   Details  insulin aspart protamine- aspart (NOVOLOG MIX 75/25) (75-25) 100 UNIT/ML injection Inject 0.2 mLs (20 Units total) into the skin 2 (two) times daily with a meal. For diabetes management Qty: 10 mL, Refills: 3      CONTINUE these medications which have NOT CHANGED   Details  amLODipine (NORVASC) 10 MG tablet Take 1 tablet (10 mg total) by mouth daily. For high blood pressure Qty: 30 tablet, Refills: 0    atenolol (TENORMIN) 25 MG tablet Take 1 tablet (25 mg total) by mouth at bedtime. For high blood pressure Qty: 30 tablet, Refills: 5    cetirizine (ZYRTEC) 10 MG tablet Take 1 tablet (10 mg total) by mouth daily. For allergies Qty: 30 tablet, Refills: 11    citalopram (CELEXA) 40 MG tablet Take 1 tablet (40 mg total) by mouth at bedtime. For depression Qty: 30 tablet, Refills: 0    cloNIDine (CATAPRES) 0.2 MG tablet Take 1 tablet (0.2 mg total) by mouth 2 (two) times daily. For high blood pressure Qty: 60 tablet, Refills: 5    enalapril  (VASOTEC) 20 MG tablet Take 1 tablet (20 mg total) by mouth daily. For high blood pressure Qty: 60 tablet, Refills: 5    gabapentin (NEURONTIN) 300 MG capsule Take 1 capsule (300 mg total) by mouth 2 (two) times daily. For agitation/neuropathic pain Qty: 60 capsule, Refills: 0    glucose blood (ONETOUCH VERIO) test strip 1 each by Other route 2 (two) times daily. And lancets 2/day 250.01: For blood sugar checks Qty: 100 each, Refills: 12    hydrOXYzine (ATARAX/VISTARIL) 25 MG tablet Take 1 tablet (25 mg total) by mouth 3 (three) times daily as needed for anxiety (sleep). Qty: 60 tablet, Refills: 0    omeprazole (PRILOSEC) 20 MG capsule Take 1 capsule (20 mg total) by mouth daily. For acid reflux Qty: 30 capsule, Refills: 5    !! QUEtiapine (SEROQUEL) 100 MG tablet Take 1 tablet (100 mg total) by mouth daily. For mood control Qty: 30 tablet, Refills: 0   Associated Diagnoses: Bipolar affective disorder, current episode hypomanic    !! QUEtiapine (SEROQUEL) 300 MG tablet Take 1 tablet (300 mg total) by mouth at bedtime. For mood control Qty: 30 tablet, Refills: 0   Associated Diagnoses: Bipolar affective disorder, current episode hypomanic  simvastatin (ZOCOR) 40 MG tablet Take 1 tablet (40 mg total) by mouth daily. For high cholesterol Qty: 30 tablet, Refills: 5    STRIBILD 150-150-200-300 MG TABS tablet Take 1 tablet by mouth at bedtime. For HIV infection Qty: 30 tablet    sodium chloride (OCEAN) 0.65 % SOLN nasal spray Place 1 spray into both nostrils as needed for congestion (for allergies). Refills: 0        STOP taking these medications     buPROPion (WELLBUTRIN XL) 300 MG 24 hr tablet      fluticasone (FLONASE) 50 MCG/ACT nasal spray      Skin Protectants, Misc. (EUCERIN) cream        No Known Allergies Follow-up Information    Follow up with Gwendolyn Grant, MD. Schedule an appointment as soon as possible for a visit in 1 week.   Specialty:  Internal Medicine    Contact information:   [address] [place] [address] [phone]       Follow up with Renato Shin, MD. Schedule an appointment as soon as possible for a visit in 3 weeks.   Specialty:  Endocrinology   Contact information:   [address] [place] [address] [phone]        The results of significant diagnostics from this hospitalization (including imaging, microbiology, ancillary and laboratory) are listed below for reference.    Significant Diagnostic Studies: Dg Chest 2 View  04/17/2015   CLINICAL DATA:  Shortness of breath.  EXAM: CHEST  2 VIEW  COMPARISON:  10/20/2014  FINDINGS: The heart size and mediastinal contours are within normal limits. Both lungs are clear. The visualized skeletal structures are unremarkable.  IMPRESSION: Normal chest.   Electronically Signed   By: Lorriane Shire M.D.   On: 04/17/2015 16:56   Ct Abdomen Pelvis W Contrast  04/17/2015   CLINICAL DATA:  Generalized abdominal pain with nausea and vomiting for 2 days  EXAM: CT ABDOMEN AND PELVIS WITH CONTRAST  TECHNIQUE: Multidetector CT imaging of the abdomen and pelvis was performed using the standard protocol following bolus administration of intravenous contrast.  CONTRAST:  138mL OMNIPAQUE IOHEXOL 300 MG/ML  SOLN  COMPARISON:  None.  FINDINGS: Lung bases are free of acute infiltrate or sizable effusion. A calcified granuloma is identified in the right lung base posteriorly.  The liver is diffusely fatty infiltrated. A single gallstone is noted without gallbladder wall thickening or biliary ductal dilatation. The spleen, adrenal glands and pancreas are within normal limits. The kidneys are well visualized bilaterally and demonstrate a normal enhancement pattern. No renal calculi or obstructive changes are seen. Normal excretion of contrast is noted.  The bladder is well distended. The uterus and ovaries demonstrate evidence of right ovarian cystic  change. The appendix is within normal limits. No acute bony abnormality is noted.  IMPRESSION: Chronic changes as described above. No acute abnormality to correspond with the patient's clinical symptomatology is noted.   Electronically Signed   By: Inez Catalina M.D.   On: 04/17/2015 15:13    Microbiology: Recent Results (from the past 240 hour(s))  Wet prep, genital     Status: Abnormal   Collection Time: 04/17/15 10:50 AM  Result Value Ref Range Status   Yeast Wet Prep HPF POC NONE SEEN NONE SEEN Final   Trich, Wet Prep NONE SEEN NONE SEEN Final   Clue Cells Wet Prep HPF POC MANY (A) NONE SEEN Final  WBC, Wet Prep HPF POC FEW (A) NONE SEEN Final    Comment: MANY BACTERIA SEEN     Labs: Basic Metabolic Panel:  Recent Labs Lab 04/17/15 1130 04/17/15 1521 04/18/15 0342  NA 132* 134* 135  K 4.0 3.6 3.4*  CL 96* 98* 102  CO2 24  --  25  GLUCOSE 643* 427* 314*  BUN 8 5* <5*  CREATININE 1.08* 0.80 0.76  CALCIUM 9.1  --  8.2*   Liver Function Tests:  Recent Labs Lab 04/17/15 1130  AST 15  ALT 16  ALKPHOS 129*  BILITOT 0.3  PROT 7.2  ALBUMIN 3.8    Recent Labs Lab 04/17/15 1522  LIPASE 14*   No results for input(s): AMMONIA in the last 168 hours. CBC:  Recent Labs Lab 04/17/15 1130 04/17/15 1521  WBC 6.0  --   NEUTROABS 3.3  --   HGB 14.9 15.0  HCT 42.4 44.0  MCV 88.9  --   PLT 239  --    Cardiac Enzymes: No results for input(s): CKTOTAL, CKMB, CKMBINDEX, TROPONINI in the last 168 hours. BNP: BNP (last 3 results) No results for input(s): BNP in the last 8760 hours.  ProBNP (last 3 results) No results for input(s): PROBNP in the last 8760 hours.  CBG:  Recent Labs Lab 04/17/15 1234 04/17/15 1539 04/17/15 1647 04/17/15 1950 04/18/15 0730  GLUCAP 554* 366* 283* 371* 269*       Signed:  Leah Skora  [facility] 04/18/2015, 9:31 AM

## 2015-04-18 NOTE — Discharge Instructions (Signed)
Bacterial Vaginosis Bacterial vaginosis is an infection of the vagina. It happens when too many of certain germs (bacteria) grow in the vagina. HOME CARE  Take your medicine as told by your doctor.  Finish your medicine even if you start to feel better.  Do not have sex until you finish your medicine and are better.  Tell your sex partner that you have an infection. They should see their doctor for treatment.  Practice safe sex. Use condoms. Have only one sex partner. GET HELP IF:  You are not getting better after 3 days of treatment.  You have more grey fluid (discharge) coming from your vagina than before.  You have more pain than before.  You have a fever. MAKE SURE YOU:   Understand these instructions.  Will watch your condition.  Will get help right away if you are not doing well or get worse. Document Released: 05/29/2008 Document Revised: 06/10/2013 Document Reviewed: 04/01/2013 ExitCare Patient Information 2015 [employer]. This information is not intended to replace advice given to you by your health care provider. Make sure you discuss any questions you have with your health care provider.  

## 2015-04-18 NOTE — Progress Notes (Signed)
Inpatient Diabetes Program Recommendations  AACE/ADA: New Consensus Statement on Inpatient Glycemic Control (2013)  Target Ranges:  Prepandial:   less than 140 mg/dL      Peak postprandial:   less than 180 mg/dL (1-2 hours)      Critically ill patients:  140 - 180 mg/dL   Reason for Visit: Hyperglycemia  Diabetes history: DM 2 Outpatient Diabetes medications: 70/30 20 units BID Current orders for Inpatient glycemic control: 70/30 20 units BID  Inpatient Diabetes Program Recommendations Insulin - Basal: If patient is not discharged, fasting glucose this am was 269 mg/dl. Please consider increasing 70/30 to 28 units BID.  Note: Patient sees Dr. Loanne Drilling (Endocrinologist) as an outpatient last visit 11/24/14 and patient was suppose to follow up 2 months after that. There are no further notes from Dr, Loanne Drilling, however, she did receive nutritional education on 01/24/15.  Patient was non compliant with insulin, exercise, and CBG checks and also drinking regular sprite at that time.  Will need to follow up with Dr. Loanne Drilling after discharge. Current dose of 20 units may not be enough for this patient.  Thanks,  Tama Headings RN, MSN, [facility] Inpatient Diabetes Coordinator Team Pager [phone]

## 2015-04-18 NOTE — Progress Notes (Signed)
Berniece Salines Tkach to be D/C'd Home per MD order.  Discussed with the patient and all questions fully answered. Follow up appointment scheduled.  VSS, Skin clean, dry and intact without evidence of skin break down, no evidence of skin tears noted. IV catheter discontinued intact. Site without signs and symptoms of complications. Dressing and pressure applied.  An After Visit Summary was printed and given to the patient. Patient received prescriptions.  D/c education completed with patient/family including follow up instructions, medication list, d/c activities limitations if indicated, with other d/c instructions as indicated by MD - patient able to verbalize understanding, all questions fully answered.   Patient instructed to return to ED, call 911, or call MD for any changes in condition.   Patient escorted via [place], and D/C home via private auto.  Micki Riley 04/18/2015 10:54 AM

## 2015-04-19 LAB — HEMOGLOBIN A1C
HEMOGLOBIN A1C: 11.6 % — AB (ref 4.8–5.6)
MEAN PLASMA GLUCOSE: 286 mg/dL

## 2015-04-25 ENCOUNTER — Inpatient Hospital Stay: Payer: Self-pay | Admitting: Internal Medicine

## 2015-04-30 DIAGNOSIS — M5441 Lumbago with sciatica, right side: Secondary | ICD-10-CM | POA: Diagnosis not present

## 2015-04-30 DIAGNOSIS — M5442 Lumbago with sciatica, left side: Secondary | ICD-10-CM | POA: Diagnosis not present

## 2015-04-30 DIAGNOSIS — M545 Low back pain: Secondary | ICD-10-CM | POA: Diagnosis not present

## 2015-04-30 DIAGNOSIS — R202 Paresthesia of skin: Secondary | ICD-10-CM | POA: Diagnosis not present

## 2015-04-30 DIAGNOSIS — G890 Central pain syndrome: Secondary | ICD-10-CM | POA: Diagnosis not present

## 2015-04-30 DIAGNOSIS — G603 Idiopathic progressive neuropathy: Secondary | ICD-10-CM | POA: Diagnosis not present

## 2015-05-01 ENCOUNTER — Other Ambulatory Visit: Payer: Self-pay | Admitting: Internal Medicine

## 2015-05-03 ENCOUNTER — Other Ambulatory Visit: Payer: Self-pay | Admitting: Internal Medicine

## 2015-05-03 NOTE — Telephone Encounter (Signed)
Patient called in requesting: amLODipine (NORVASC) 10 MG tablet [[id number] cloNIDine (CATAPRES) 0.2 MG tablet [[id number] enalapril (VASOTEC) 20 MG tablet [[id number] omeprazole (PRILOSEC) 20 MG capsule [[id number] traMADol-acetaminophen (ULTRACET) 37.5-325 MG per tablet [[id number]]   She also has a yeast infection and would like a prescription for that also. She states she is unable to come in to the office

## 2015-05-03 NOTE — Telephone Encounter (Signed)
Only maintenance medication can be filled with a note that an appointment is needed for any additional refills.

## 2015-05-04 MED ORDER — OMEPRAZOLE 20 MG PO CPDR: 20.0000 mg | DELAYED_RELEASE_CAPSULE | Freq: Every day | ORAL | Status: DC

## 2015-05-04 MED ORDER — ENALAPRIL MALEATE 20 MG PO TABS: 20.0000 mg | ORAL_TABLET | Freq: Every day | ORAL | Status: DC

## 2015-05-04 MED ORDER — AMLODIPINE BESYLATE 10 MG PO TABS: 10.0000 mg | ORAL_TABLET | Freq: Every day | ORAL | Status: DC

## 2015-05-05 NOTE — Telephone Encounter (Signed)
Pt informed. She will call back to schedule an appt

## 2015-05-10 ENCOUNTER — Other Ambulatory Visit: Payer: Self-pay | Admitting: *Deleted

## 2015-05-10 ENCOUNTER — Encounter (HOSPITAL_COMMUNITY): Payer: Self-pay | Admitting: Emergency Medicine

## 2015-05-10 ENCOUNTER — Emergency Department (HOSPITAL_COMMUNITY)
Admission: EM | Admit: 2015-05-10 | Discharge: 2015-05-11 | Disposition: A | Discharge status: Other Acute Inpatient Hospital | Payer: Medicare Other | Attending: Emergency Medicine | Admitting: Emergency Medicine

## 2015-05-10 DIAGNOSIS — J45909 Unspecified asthma, uncomplicated: Secondary | ICD-10-CM | POA: Insufficient documentation

## 2015-05-10 DIAGNOSIS — Z21 Asymptomatic human immunodeficiency virus [HIV] infection status: Secondary | ICD-10-CM | POA: Insufficient documentation

## 2015-05-10 DIAGNOSIS — E119 Type 2 diabetes mellitus without complications: Secondary | ICD-10-CM | POA: Insufficient documentation

## 2015-05-10 DIAGNOSIS — Z720 Tobacco use: Secondary | ICD-10-CM | POA: Diagnosis not present

## 2015-05-10 DIAGNOSIS — M170 Bilateral primary osteoarthritis of knee: Secondary | ICD-10-CM | POA: Diagnosis not present

## 2015-05-10 DIAGNOSIS — I10 Essential (primary) hypertension: Secondary | ICD-10-CM | POA: Insufficient documentation

## 2015-05-10 DIAGNOSIS — Z8673 Personal history of transient ischemic attack (TIA), and cerebral infarction without residual deficits: Secondary | ICD-10-CM | POA: Diagnosis not present

## 2015-05-10 DIAGNOSIS — E780 Pure hypercholesterolemia: Secondary | ICD-10-CM | POA: Diagnosis not present

## 2015-05-10 DIAGNOSIS — Z87828 Personal history of other (healed) physical injury and trauma: Secondary | ICD-10-CM | POA: Insufficient documentation

## 2015-05-10 DIAGNOSIS — R45850 Homicidal ideations: Secondary | ICD-10-CM

## 2015-05-10 DIAGNOSIS — K219 Gastro-esophageal reflux disease without esophagitis: Secondary | ICD-10-CM | POA: Insufficient documentation

## 2015-05-10 DIAGNOSIS — G43909 Migraine, unspecified, not intractable, without status migrainosus: Secondary | ICD-10-CM | POA: Diagnosis not present

## 2015-05-10 DIAGNOSIS — Z79899 Other long term (current) drug therapy: Secondary | ICD-10-CM | POA: Diagnosis not present

## 2015-05-10 DIAGNOSIS — F310 Bipolar disorder, current episode hypomanic: Secondary | ICD-10-CM

## 2015-05-10 DIAGNOSIS — R45851 Suicidal ideations: Secondary | ICD-10-CM | POA: Diagnosis not present

## 2015-05-10 DIAGNOSIS — R443 Hallucinations, unspecified: Secondary | ICD-10-CM | POA: Insufficient documentation

## 2015-05-10 LAB — COMPREHENSIVE METABOLIC PANEL
ALT: 11 U/L — ABNORMAL LOW (ref 14–54)
AST: 16 U/L (ref 15–41)
Albumin: 3.7 g/dL (ref 3.5–5.0)
Alkaline Phosphatase: 106 U/L (ref 38–126)
Anion gap: 7 (ref 5–15)
BUN: 12 mg/dL (ref 6–20)
CO2: 28 mmol/L (ref 22–32)
Calcium: 9.4 mg/dL (ref 8.9–10.3)
Chloride: 104 mmol/L (ref 101–111)
Creatinine, Ser: 0.75 mg/dL (ref 0.44–1.00)
GFR calc Af Amer: >60 mL/min (ref >60)
GFR calc non Af Amer: >60 mL/min (ref >60)
Glucose, Bld: 51 mg/dL — ABNORMAL LOW (ref 65–99)
Potassium: 3.2 mmol/L — ABNORMAL LOW (ref 3.5–5.1)
Sodium: 139 mmol/L (ref 135–145)
Total Bilirubin: 0.2 mg/dL — ABNORMAL LOW (ref 0.3–1.2)
Total Protein: 6.8 g/dL (ref 6.5–8.1)

## 2015-05-10 LAB — RAPID URINE DRUG SCREEN, HOSP PERFORMED
Amphetamines: NOT DETECTED
Barbiturates: NOT DETECTED
Benzodiazepines: NOT DETECTED
Cocaine: NOT DETECTED
Opiates: NOT DETECTED
Tetrahydrocannabinol: POSITIVE — AB

## 2015-05-10 LAB — CBG MONITORING, ED
GLUCOSE-CAPILLARY: 154 mg/dL — AB (ref 65–99)
GLUCOSE-CAPILLARY: 95 mg/dL (ref 65–99)

## 2015-05-10 LAB — ACETAMINOPHEN LEVEL

## 2015-05-10 LAB — CBC
HEMATOCRIT: 42.7 % (ref 36.0–46.0)
HEMOGLOBIN: 15.1 g/dL — AB (ref 12.0–15.0)
MCH: 31.3 pg (ref 26.0–34.0)
MCHC: 35.4 g/dL (ref 30.0–36.0)
MCV: 88.4 fL (ref 78.0–100.0)
Platelets: 240 10*3/uL (ref 150–400)
RBC: 4.83 MIL/uL (ref 3.87–5.11)
RDW: 13.3 % (ref 11.5–15.5)
WBC: 10.3 10*3/uL (ref 4.0–10.5)

## 2015-05-10 LAB — SALICYLATE LEVEL

## 2015-05-10 LAB — ETHANOL: Alcohol, Ethyl (B): <5 mg/dL (ref <5)

## 2015-05-10 MED ORDER — CITALOPRAM HYDROBROMIDE 40 MG PO TABS
40.0000 mg | ORAL_TABLET | Freq: Every day | ORAL | Status: DC
  Administered 2015-05-10: 40 mg via ORAL
  Filled 2015-05-10 (×2): qty 1

## 2015-05-10 MED ORDER — ELVITEG-COBIC-EMTRICIT-TENOFAF 150-150-200-10 MG PO TABS: 1.0000 | ORAL_TABLET | Freq: Every day | ORAL | Status: DC

## 2015-05-10 MED ORDER — NYSTATIN 100000 UNIT/GM EX POWD
Freq: Two times a day (BID) | CUTANEOUS | Status: DC
  Filled 2015-05-10: qty 15

## 2015-05-10 MED ORDER — QUETIAPINE FUMARATE 100 MG PO TABS: 100.0000 mg | ORAL_TABLET | ORAL | Status: DC

## 2015-05-10 MED ORDER — PANTOPRAZOLE SODIUM 40 MG PO TBEC
40.0000 mg | DELAYED_RELEASE_TABLET | Freq: Every day | ORAL | Status: DC
  Administered 2015-05-10: 40 mg via ORAL
  Filled 2015-05-10: qty 1

## 2015-05-10 MED ORDER — INSULIN LISPRO PROT & LISPRO (75-25 MIX) 100 UNIT/ML KWIKPEN: 100.0000 [IU] | PEN_INJECTOR | Freq: Every morning | SUBCUTANEOUS | Status: DC

## 2015-05-10 MED ORDER — INSULIN LISPRO PROT & LISPRO (75-25 MIX) 100 UNIT/ML KWIKPEN: 100.0000 [IU] | PEN_INJECTOR | Freq: Every day | SUBCUTANEOUS | Status: DC

## 2015-05-10 MED ORDER — AMLODIPINE BESYLATE 10 MG PO TABS
10.0000 mg | ORAL_TABLET | Freq: Every day | ORAL | Status: DC
  Filled 2015-05-10: qty 1

## 2015-05-10 MED ORDER — ELVITEG-COBIC-EMTRICIT-TENOFAF 150-150-200-10 MG PO TABS
1.0000 | ORAL_TABLET | Freq: Every day | ORAL | Status: DC
  Administered 2015-05-10: 1 via ORAL
  Filled 2015-05-10 (×2): qty 1

## 2015-05-10 MED ORDER — INSULIN LISPRO PROT & LISPRO (75-25 MIX) 100 UNIT/ML ~~LOC~~ SUSP
100.0000 [IU] | Freq: Every day | SUBCUTANEOUS | Status: DC
  Filled 2015-05-10: qty 10

## 2015-05-10 MED ORDER — QUETIAPINE FUMARATE 300 MG PO TABS
300.0000 mg | ORAL_TABLET | Freq: Every day | ORAL | Status: DC
  Administered 2015-05-10: 300 mg via ORAL
  Filled 2015-05-10: qty 1

## 2015-05-10 MED ORDER — CLONIDINE HCL 0.1 MG PO TABS
0.2000 mg | ORAL_TABLET | Freq: Two times a day (BID) | ORAL | Status: DC
  Administered 2015-05-10: 0.2 mg via ORAL
  Filled 2015-05-10: qty 2

## 2015-05-10 MED ORDER — QUETIAPINE FUMARATE 300 MG PO TABS: 300.0000 mg | ORAL_TABLET | Freq: Every day | ORAL | Status: DC

## 2015-05-10 MED ORDER — INSULIN ASPART PROT & ASPART (70-30 MIX) 100 UNIT/ML ~~LOC~~ SUSP: 20.0000 [IU] | Freq: Two times a day (BID) | SUBCUTANEOUS | Status: DC

## 2015-05-10 MED ORDER — ATENOLOL 25 MG PO TABS
25.0000 mg | ORAL_TABLET | Freq: Every day | ORAL | Status: DC
  Administered 2015-05-10: 25 mg via ORAL
  Filled 2015-05-10 (×2): qty 1

## 2015-05-10 MED ORDER — GABAPENTIN 300 MG PO CAPS
300.0000 mg | ORAL_CAPSULE | Freq: Two times a day (BID) | ORAL | Status: DC
  Administered 2015-05-10: 300 mg via ORAL
  Filled 2015-05-10: qty 1

## 2015-05-10 MED ORDER — SIMVASTATIN 40 MG PO TABS
40.0000 mg | ORAL_TABLET | Freq: Every day | ORAL | Status: DC
  Administered 2015-05-10: 40 mg via ORAL
  Filled 2015-05-10 (×2): qty 1

## 2015-05-10 MED ORDER — ENALAPRIL MALEATE 20 MG PO TABS
20.0000 mg | ORAL_TABLET | Freq: Every day | ORAL | Status: DC
  Administered 2015-05-10: 20 mg via ORAL
  Filled 2015-05-10 (×2): qty 1

## 2015-05-10 NOTE — ED Notes (Signed)
Per pt, states she has being feeling weird for a couple of days-SI and HI-wants to lash out at someone

## 2015-05-10 NOTE — BH Assessment (Signed)
Reviewed ED notes prior to initiating assessment. Per notes pt states she has been feeling "weird" for a couple of days, noting SI and HI, and wanting "lash out at someone."  Pt was inpt at [facility] 01-30-15 for MDD.   Assessment to commence shortly.   Lear Ng, [facility] Triage Specialist 05/10/2015 7:21 PM
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
## 2015-05-10 NOTE — Telephone Encounter (Signed)
Pt is working with [facility], Cristie Hem, to obtain a [facility] Provider.

## 2015-05-10 NOTE — ED Notes (Signed)
Unsuccessful at collecting pts labs, RN aware

## 2015-05-10 NOTE — ED Notes (Signed)
Pt changed into paper scrubs and wanded by Security.   Pt has one belonging bag that is at Triage Nurse's desk.   Sitter at bedside.

## 2015-05-10 NOTE — ED Notes (Addendum)
Phone call received from [place], [facility] regarding placement. Requesting that Dr Tyrone Nine complete a note that pt is medically cleared.  Dr Tyrone Nine made aware. Also made Dr Tyrone Nine aware that pt is currently rx'd Humalog, but reports taking Novolog 70/30- which was discontinued.

## 2015-05-10 NOTE — BH Assessment (Signed)
Per Luretha Murphy pt has been accepted to [facility] under the care of Dr. Shea Evans, to arrive at 0230. [place] be called to [phone]. Tori AC requests  1 more CBG prior to sending pt to [facility].   Caryl Comes, RN informed.   Lear Ng, [facility] Triage Specialist 05/10/2015 10:42 PM
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
## 2015-05-11 ENCOUNTER — Emergency Department (HOSPITAL_COMMUNITY)
Admission: EM | Admit: 2015-05-11 | Discharge: 2015-05-19 | Discharge status: Absent without leave | Payer: Medicare Other | Source: Home / Self Care

## 2015-05-11 ENCOUNTER — Encounter (HOSPITAL_COMMUNITY): Payer: Self-pay | Admitting: Nurse Practitioner

## 2015-05-11 ENCOUNTER — Other Ambulatory Visit: Payer: Self-pay

## 2015-05-11 ENCOUNTER — Inpatient Hospital Stay (HOSPITAL_COMMUNITY)
Admission: AD | Admit: 2015-05-11 | Discharge: 2015-05-17 | DRG: 885 | Disposition: A | Discharge status: Home / Self Care | Payer: 59 | Source: Intra-hospital | Attending: Psychiatry | Admitting: Psychiatry

## 2015-05-11 DIAGNOSIS — E780 Pure hypercholesterolemia: Secondary | ICD-10-CM | POA: Diagnosis present

## 2015-05-11 DIAGNOSIS — J45909 Unspecified asthma, uncomplicated: Secondary | ICD-10-CM | POA: Diagnosis not present

## 2015-05-11 DIAGNOSIS — G4700 Insomnia, unspecified: Secondary | ICD-10-CM | POA: Diagnosis present

## 2015-05-11 DIAGNOSIS — B20 Human immunodeficiency virus [HIV] disease: Secondary | ICD-10-CM | POA: Diagnosis present

## 2015-05-11 DIAGNOSIS — Z87828 Personal history of other (healed) physical injury and trauma: Secondary | ICD-10-CM | POA: Diagnosis not present

## 2015-05-11 DIAGNOSIS — E119 Type 2 diabetes mellitus without complications: Secondary | ICD-10-CM

## 2015-05-11 DIAGNOSIS — F1220 Cannabis dependence, uncomplicated: Secondary | ICD-10-CM

## 2015-05-11 DIAGNOSIS — E785 Hyperlipidemia, unspecified: Secondary | ICD-10-CM | POA: Diagnosis present

## 2015-05-11 DIAGNOSIS — M1990 Unspecified osteoarthritis, unspecified site: Secondary | ICD-10-CM | POA: Diagnosis present

## 2015-05-11 DIAGNOSIS — Z8673 Personal history of transient ischemic attack (TIA), and cerebral infarction without residual deficits: Secondary | ICD-10-CM

## 2015-05-11 DIAGNOSIS — I952 Hypotension due to drugs: Secondary | ICD-10-CM | POA: Diagnosis present

## 2015-05-11 DIAGNOSIS — K219 Gastro-esophageal reflux disease without esophagitis: Secondary | ICD-10-CM | POA: Diagnosis not present

## 2015-05-11 DIAGNOSIS — F17210 Nicotine dependence, cigarettes, uncomplicated: Secondary | ICD-10-CM | POA: Diagnosis present

## 2015-05-11 DIAGNOSIS — F333 Major depressive disorder, recurrent, severe with psychotic symptoms: Principal | ICD-10-CM | POA: Diagnosis present

## 2015-05-11 DIAGNOSIS — E1140 Type 2 diabetes mellitus with diabetic neuropathy, unspecified: Secondary | ICD-10-CM | POA: Diagnosis present

## 2015-05-11 DIAGNOSIS — I959 Hypotension, unspecified: Secondary | ICD-10-CM | POA: Clinically undetermined

## 2015-05-11 DIAGNOSIS — R443 Hallucinations, unspecified: Secondary | ICD-10-CM | POA: Diagnosis not present

## 2015-05-11 DIAGNOSIS — I10 Essential (primary) hypertension: Secondary | ICD-10-CM | POA: Diagnosis present

## 2015-05-11 DIAGNOSIS — F4310 Post-traumatic stress disorder, unspecified: Secondary | ICD-10-CM | POA: Diagnosis present

## 2015-05-11 DIAGNOSIS — F410 Panic disorder [episodic paroxysmal anxiety] without agoraphobia: Secondary | ICD-10-CM | POA: Diagnosis present

## 2015-05-11 DIAGNOSIS — G43909 Migraine, unspecified, not intractable, without status migrainosus: Secondary | ICD-10-CM | POA: Diagnosis not present

## 2015-05-11 DIAGNOSIS — F17200 Nicotine dependence, unspecified, uncomplicated: Secondary | ICD-10-CM | POA: Diagnosis present

## 2015-05-11 DIAGNOSIS — Z720 Tobacco use: Secondary | ICD-10-CM | POA: Diagnosis not present

## 2015-05-11 DIAGNOSIS — M170 Bilateral primary osteoarthritis of knee: Secondary | ICD-10-CM | POA: Diagnosis not present

## 2015-05-11 DIAGNOSIS — R45851 Suicidal ideations: Secondary | ICD-10-CM | POA: Diagnosis present

## 2015-05-11 DIAGNOSIS — R45850 Homicidal ideations: Secondary | ICD-10-CM | POA: Diagnosis present

## 2015-05-11 DIAGNOSIS — Z794 Long term (current) use of insulin: Secondary | ICD-10-CM

## 2015-05-11 DIAGNOSIS — E1065 Type 1 diabetes mellitus with hyperglycemia: Secondary | ICD-10-CM | POA: Insufficient documentation

## 2015-05-11 DIAGNOSIS — Z79899 Other long term (current) drug therapy: Secondary | ICD-10-CM | POA: Diagnosis not present

## 2015-05-11 DIAGNOSIS — F331 Major depressive disorder, recurrent, moderate: Secondary | ICD-10-CM | POA: Clinically undetermined

## 2015-05-11 DIAGNOSIS — Z21 Asymptomatic human immunodeficiency virus [HIV] infection status: Secondary | ICD-10-CM | POA: Diagnosis not present

## 2015-05-11 DIAGNOSIS — F329 Major depressive disorder, single episode, unspecified: Secondary | ICD-10-CM | POA: Diagnosis present

## 2015-05-11 LAB — GLUCOSE, CAPILLARY
GLUCOSE-CAPILLARY: 374 mg/dL — AB (ref 65–99)
GLUCOSE-CAPILLARY: 288 mg/dL — AB (ref 65–99)
Glucose-Capillary: 212 mg/dL — ABNORMAL HIGH (ref 65–99)
Glucose-Capillary: 265 mg/dL — ABNORMAL HIGH (ref 65–99)
Glucose-Capillary: 214 mg/dL — ABNORMAL HIGH (ref 65–99)

## 2015-05-11 LAB — CBC WITH DIFFERENTIAL/PLATELET
BASOS PCT: 0 % (ref 0–1)
Basophils Absolute: 0 10*3/uL (ref 0.0–0.1)
EOS ABS: 0.5 10*3/uL (ref 0.0–0.7)
EOS PCT: 6 % — AB (ref 0–5)
HCT: 41.1 % (ref 36.0–46.0)
HEMOGLOBIN: 14.4 g/dL (ref 12.0–15.0)
Lymphocytes Relative: 33 % (ref 12–46)
Lymphs Abs: 2.8 10*3/uL (ref 0.7–4.0)
MCH: 31.1 pg (ref 26.0–34.0)
MCHC: 35 g/dL (ref 30.0–36.0)
MCV: 88.8 fL (ref 78.0–100.0)
MONOS PCT: 6 % (ref 3–12)
Monocytes Absolute: 0.5 10*3/uL (ref 0.1–1.0)
NEUTROS PCT: 55 % (ref 43–77)
Neutro Abs: 4.6 10*3/uL (ref 1.7–7.7)
PLATELETS: 228 10*3/uL (ref 150–400)
RBC: 4.63 MIL/uL (ref 3.87–5.11)
RDW: 13.5 % (ref 11.5–15.5)
WBC: 8.4 10*3/uL (ref 4.0–10.5)

## 2015-05-11 LAB — BASIC METABOLIC PANEL
Anion gap: 8 (ref 5–15)
BUN: 15 mg/dL (ref 6–20)
CALCIUM: 8.7 mg/dL — AB (ref 8.9–10.3)
CO2: 26 mmol/L (ref 22–32)
CREATININE: 0.87 mg/dL (ref 0.44–1.00)
Chloride: 103 mmol/L (ref 101–111)
GFR calc non Af Amer: >60 mL/min (ref >60)
Glucose, Bld: 215 mg/dL — ABNORMAL HIGH (ref 65–99)
Potassium: 3.4 mmol/L — ABNORMAL LOW (ref 3.5–5.1)
SODIUM: 137 mmol/L (ref 135–145)

## 2015-05-11 LAB — CBG MONITORING, ED
GLUCOSE-CAPILLARY: 160 mg/dL — AB (ref 65–99)
Glucose-Capillary: 203 mg/dL — ABNORMAL HIGH (ref 65–99)

## 2015-05-11 MED ORDER — QUETIAPINE FUMARATE 100 MG PO TABS
100.0000 mg | ORAL_TABLET | Freq: Every day | ORAL | Status: DC
  Administered 2015-05-11 – 2015-05-17 (×7): 100 mg via ORAL
  Filled 2015-05-11 (×10): qty 1

## 2015-05-11 MED ORDER — PANTOPRAZOLE SODIUM 20 MG PO TBEC
20.0000 mg | DELAYED_RELEASE_TABLET | Freq: Two times a day (BID) | ORAL | Status: DC
  Administered 2015-05-11 – 2015-05-17 (×12): 20 mg via ORAL
  Filled 2015-05-11 (×16): qty 1

## 2015-05-11 MED ORDER — INSULIN ASPART 100 UNIT/ML ~~LOC~~ SOLN
0.0000 [IU] | Freq: Once | SUBCUTANEOUS | Status: AC
  Administered 2015-05-11: 15 [IU] via SUBCUTANEOUS

## 2015-05-11 MED ORDER — VENLAFAXINE HCL ER 37.5 MG PO CP24
37.5000 mg | ORAL_CAPSULE | Freq: Every day | ORAL | Status: DC
  Administered 2015-05-11 – 2015-05-13 (×3): 37.5 mg via ORAL
  Filled 2015-05-11 (×5): qty 1

## 2015-05-11 MED ORDER — ALUM & MAG HYDROXIDE-SIMETH 200-200-20 MG/5ML PO SUSP: 30.0000 mL | ORAL | Status: DC | PRN

## 2015-05-11 MED ORDER — GABAPENTIN 300 MG PO CAPS
300.0000 mg | ORAL_CAPSULE | Freq: Two times a day (BID) | ORAL | Status: DC
  Administered 2015-05-11 – 2015-05-14 (×6): 300 mg via ORAL
  Filled 2015-05-11 (×10): qty 1

## 2015-05-11 MED ORDER — GLUCERNA SHAKE PO LIQD
237.0000 mL | Freq: Two times a day (BID) | ORAL | Status: DC
  Administered 2015-05-11 – 2015-05-14 (×6): 237 mL via ORAL

## 2015-05-11 MED ORDER — POTASSIUM CHLORIDE CRYS ER 10 MEQ PO TBCR
10.0000 meq | EXTENDED_RELEASE_TABLET | Freq: Two times a day (BID) | ORAL | Status: AC
  Administered 2015-05-11 – 2015-05-12 (×2): 10 meq via ORAL
  Filled 2015-05-11 (×3): qty 1

## 2015-05-11 MED ORDER — MAGNESIUM HYDROXIDE 400 MG/5ML PO SUSP
30.0000 mL | Freq: Every day | ORAL | Status: DC | PRN
  Administered 2015-05-11: 30 mL via ORAL
  Filled 2015-05-11: qty 30

## 2015-05-11 MED ORDER — HYDROXYZINE HCL 25 MG PO TABS
25.0000 mg | ORAL_TABLET | Freq: Four times a day (QID) | ORAL | Status: DC | PRN
  Administered 2015-05-11 – 2015-05-12 (×4): 25 mg via ORAL
  Filled 2015-05-11 (×4): qty 1

## 2015-05-11 MED ORDER — QUETIAPINE FUMARATE 300 MG PO TABS
300.0000 mg | ORAL_TABLET | Freq: Every day | ORAL | Status: DC
  Administered 2015-05-11: 300 mg via ORAL
  Filled 2015-05-11 (×2): qty 1

## 2015-05-11 MED ORDER — ACETAMINOPHEN 325 MG PO TABS
650.0000 mg | ORAL_TABLET | Freq: Four times a day (QID) | ORAL | Status: DC | PRN
  Administered 2015-05-12 (×2): 650 mg via ORAL
  Filled 2015-05-11 (×2): qty 2

## 2015-05-11 MED ORDER — SODIUM CHLORIDE 0.9 % IV BOLUS (SEPSIS)
1000.0000 mL | Freq: Once | INTRAVENOUS | Status: AC
  Administered 2015-05-11: 1000 mL via INTRAVENOUS

## 2015-05-11 MED ORDER — INSULIN ASPART 100 UNIT/ML ~~LOC~~ SOLN
0.0000 [IU] | Freq: Three times a day (TID) | SUBCUTANEOUS | Status: DC
  Administered 2015-05-11: 8 [IU] via SUBCUTANEOUS
  Administered 2015-05-12: 11 [IU] via SUBCUTANEOUS
  Administered 2015-05-12: 5 [IU] via SUBCUTANEOUS
  Administered 2015-05-12: 8 [IU] via SUBCUTANEOUS
  Administered 2015-05-13: 11 [IU] via SUBCUTANEOUS
  Administered 2015-05-13: 8 [IU] via SUBCUTANEOUS
  Administered 2015-05-13: 11 [IU] via SUBCUTANEOUS
  Administered 2015-05-14: 5 [IU] via SUBCUTANEOUS
  Administered 2015-05-14: 11 [IU] via SUBCUTANEOUS
  Administered 2015-05-14: 8 [IU] via SUBCUTANEOUS
  Administered 2015-05-15: 5 [IU] via SUBCUTANEOUS
  Administered 2015-05-15: 8 [IU] via SUBCUTANEOUS
  Administered 2015-05-15: 15 [IU] via SUBCUTANEOUS
  Administered 2015-05-16: 8 [IU] via SUBCUTANEOUS
  Administered 2015-05-16: 5 [IU] via SUBCUTANEOUS
  Administered 2015-05-16 – 2015-05-17 (×2): 11 [IU] via SUBCUTANEOUS
  Administered 2015-05-17: 3 [IU] via SUBCUTANEOUS

## 2015-05-11 MED ORDER — ENSURE ENLIVE PO LIQD: 237.0000 mL | Freq: Two times a day (BID) | ORAL | Status: DC

## 2015-05-11 NOTE — Progress Notes (Deleted)
Discharge note: Pt discharged per MD order. Discharge summary reviewed with pt. Pt verbalizes understanding of discharge and signs discharge summary. RX given to pt. Pt denies SI/HI at discharge. All items returned to pt from locker #24. Pt verbalizes and signs that he received all personal items from locker. Pt denies SI/HI . Denies AVH. Denies physical pain. Ambulatory out to lobby.

## 2015-05-11 NOTE — ED Notes (Signed)
[facility] arrived to transport patient.   

## 2015-05-11 NOTE — Tx Team (Signed)
Interdisciplinary Treatment Plan Update (Adult)  Date:  05/11/2015   Time Reviewed:  10:56 AM   Progress in Treatment: Attending groups: Yes. Participating in groups:  Yes. Taking medication as prescribed:  Yes. Tolerating medication:  Yes. Family/Significant othe contact made:  No Patient understands diagnosis:  Yes  As evidenced by seeking help with hopelessness, getting back on meds Discussing patient identified problems/goals with staff:  Yes, see initial care plan. Medical problems stabilized or resolved:  Yes. Denies suicidal/homicidal ideation: Yes. Issues/concerns per patient self-inventory:  No. Other:  New problem(s) identified:  Discharge Plan or Barriers:  Return home, follow up outpt  Reason for Continuation of Hospitalization: Depression Hallucinations Medication stabilization  Comments:  Susan [name] is an 54 y.o. female. Presenting to [facility] ED with SI and HI that became severe earlier today. Pt reports she told her daughter she was feeling unsafe, so daughter took pt's medications away from her. Pt then attempted to overdose on her insulin. Pt reports this is her 6th suicide attempt since age 80. Pt reports she is have command AH to harm herself by jumping off a bridge or to choke her dtr. Pt reports she has been very stressed and overwhelmed, feeling like she is responsible to make others (her children) act cordially and she is overwhelmed by this. Pt reports she was off her medications for a week, until today because [name] will no longer see her due to her insurance. Pt reports she has been using a little THC the last couple of days due to pain. She denies other alcohol or drug use, and reports she has been in recovery from heroin abuse for 6 years. Speech in logical and coherent. Mood is depressed and anxious with appropriate affect. Pt is unable to contract for safety towards self and others at this time. No hx of self injury.  Neurontin, Seroquel, Effexor  trial  Estimated length of stay: 4-5 days  New goal(s):  Review of initial/current patient goals per problem list:   Review of initial/current patient goals per problem list:  1. Goal(s): Patient will participate in aftercare plan   Met: Yes Target date: 3-5 days post admission date   As evidenced by: Patient will participate within aftercare plan AEB aftercare provider and housing plan at discharge being identified.  05/11/2015:Pt plans to return home, follow up outpt.   2. Goal (s): Patient will exhibit decreased depressive symptoms and suicidal ideations.   Met: No Target date: 3-5 days post admission date   As evidenced by: Patient will utilize self rating of depression at 3 or below and demonstrate decreased signs of depression or be deemed stable for discharge by MD. 05/11/15 Pt attempted suicide prior to admission.  Rates depression a 10 today   Goal(s): Patient will demonstrate decreased signs of psychosis  * Met: No   Target date: 3-5 days post admission date  * As evidenced by: Patient will demonstrate decreased frequency of AVH or return to baseline function 05/11/15  Pt has been non-compliant with meds, follow up appointments, c/o AH    Attendees: Patient:  05/11/2015 10:56 AM   Family:   05/11/2015 10:56 AM   Physician:  Ursula Alert, MD 05/11/2015 10:56 AM   Nursing:   Gaylan Gerold, RN 05/11/2015 10:56 AM   CSW:    Roque Lias, LCSW   05/11/2015 10:56 AM   Other:  05/11/2015 10:56 AM   Other:   05/11/2015 10:56 AM   Other:  Lars Pinks, Nurse CM 05/11/2015 10:56  AM   Other:  Lucinda Dell, [facility] TCT 05/11/2015 10:56 AM   Other:  Norberto Sorenson, [place]  05/11/2015 10:56 AM   Other:  05/11/2015 10:56 AM   Other:  05/11/2015 10:56 AM   Other:  05/11/2015 10:56 AM   Other:  05/11/2015 10:56 AM   Other:  05/11/2015 10:56 AM   Other:   05/11/2015 10:56 AM    Scribe for Treatment Team:   Trish Mage, 05/11/2015 10:56 AM

## 2015-05-11 NOTE — [facility] Group Notes (Signed)
[facility] LCSW Group Therapy  05/11/2015 4:28 PM  Type of Therapy: Group Therapy  Participation Level: Active  Participation Quality: Attentive  Affect: Flat  Cognitive: Oriented  Insight: Limited  Engagement in Therapy: Engaged  Modes of Intervention: Discussion and Socialization  Summary of Progress/Problems: Susan [name] from the [facility] was here to tell his story of recovery and play his guitar. Pt was pleasant and cooperative. Pt was able to relate to presenter's struggle with substances and offered appropriate input.  Kara Mead. Marshell Levan 05/11/2015 4:28 PM

## 2015-05-11 NOTE — Progress Notes (Signed)
Please also see Progress note time 4:07am. Upon Susan [name]'s initial assessment at approx. 3:45am she appeared teary eyed, depressed and sad with a flat affect. She endorses passive SI and intermittent Auditory hallucinations but denied HI and visual hallucinations at the time of the assessment. She states she is depressed and does not feel she is safe to be alone at this time. She is cooperative during the assessment but having difficulty staying awake. She also reports feeling dizzy and weak upon standing. She is noted to be hypotensive as well. Upon admission to unit [facility] remained hypotensive, with reports of headache and dizziness. PA notified of patient's current medical status.

## 2015-05-11 NOTE — Progress Notes (Signed)
Inpatient Diabetes Program Recommendations  AACE/ADA: New Consensus Statement on Inpatient Glycemic Control (2013)  Target Ranges:  Prepandial:   less than 140 mg/dL      Peak postprandial:   less than 180 mg/dL (1-2 hours)      Critically ill patients:  140 - 180 mg/dL   Reason for Visit: Diabetes Consult  Results for EVY, LUTTERMAN (MRN [id number]) as of 05/11/2015 16:12  Ref. Range 05/11/2015 03:43 05/11/2015 08:09 05/11/2015 12:15  Glucose-Capillary Latest Ref Range: 65-99 mg/dL 212 (H) 203 (H) 374 (H)   Results for STACEY, SAGO (MRN [id number]) as of 05/11/2015 16:12  Ref. Range 04/18/2015 03:42  Hemoglobin A1C Latest Ref Range: 4.8-5.6 % 11.6 (H)   Needs basal insulin. Poor glycemic control at home.  Recommendations: Add Lantus 24 units QHS. Titrate up if FBS > 140 mg/dL. Needs insulin adjustment at discharge with elevated HgbA1C. Add Novolog HS correction.  Thank you. Lorenda Peck, RD, LDN, CDE Inpatient Diabetes Coordinator [phone]
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
## 2015-05-11 NOTE — ED Notes (Signed)
Bed: WQ37 Expected date:  Expected time:  Means of arrival:  Comments: [place]

## 2015-05-11 NOTE — ED Notes (Signed)
[facility] notified for transport of patient back to [facility] sitter at bedside
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
## 2015-05-11 NOTE — Progress Notes (Addendum)
D: Per patient self inventory form pt reports she slept good last night. She reports a poor appetite, low energy level, poor concentration. She rates depression 10/10, hopelessness 10/10, anxiety 10/10- all on 0-10 scale, 10 being the worse. Pt voices passive SI, but is able to verbally contract for safety. Pt reports auditory hallucinations, denies visual hallucinations. Pt denies physical pain. She reports her goal for the day is to work on "my feelings." Pt pleasant on approach. Reports she is eager for treatment. Gait slow, but steady. Presents with anxiety.   A:Special checks q 15 mins in place for safety. Medication administered per MD order. Encouragement and support provided. PRN medication given for anxiety.   R: Compliant with medication regimen. Safety maintained. Pt went to dining room to eat lunch. No falls. Will continue to monitor.

## 2015-05-11 NOTE — ED Notes (Signed)
Report called to Caryl Pina, RN at [facility]. Notified of patient HR of 48 patient asymptomatic at this time. Patient approved for transfer by [facility].

## 2015-05-11 NOTE — Tx Team (Signed)
Initial Interdisciplinary Treatment Plan   PATIENT STRESSORS: Financial difficulties Medication change or noncompliance Occupational concerns Substance abuse   PATIENT STRENGTHS: General fund of knowledge Motivation for treatment/growth Supportive family/friends   PROBLEM LIST: Problem List/Patient Goals Date to be addressed Date deferred Reason deferred Estimated date of resolution  "Get back on track with medications" 05-11-2015     "Help me understand some things" (mood) 05-11-2015     Depression 05-11-2015     Suicidal Ideation 05-11-2015                                    DISCHARGE CRITERIA:  Ability to meet basic life and health needs Improved stabilization in mood, thinking, and/or behavior Medical problems require only outpatient monitoring Need for constant or close observation no longer present Reduction of life-threatening or endangering symptoms to within safe limits Safe-care adequate arrangements made Verbal commitment to aftercare and medication compliance  PRELIMINARY DISCHARGE PLAN: Attend aftercare/continuing care group Outpatient therapy Placement in alternative living arrangements  PATIENT/FAMIILY INVOLVEMENT: This treatment plan has been presented to and reviewed with the patient, Susan [name].  The patient and family have been given the opportunity to ask questions and make suggestions.  Gildardo Pounds 05/11/2015, 4:53 AM

## 2015-05-11 NOTE — Progress Notes (Signed)
Adult Psychoeducational Group Note  Date:  05/11/2015 Time:  9:39 PM  Group Topic/Focus:  Wrap-Up Group:   The focus of this group is to help patients review their daily goal of treatment and discuss progress on daily workbooks.  Participation Level:  Minimal  Participation Quality:  Appropriate  Affect:  Depressed  Cognitive:  Oriented  Insight: Limited  Engagement in Group:  Engaged  Modes of Intervention:  Socialization and Support  Additional Comments:  Patient attended and participated in group tonight. She advised that today she went to the medical hospital 2X. She is still feeling depressed, however, she stays out of her room and socialized with peers in the day room. Tonight she is feeling a lot better regarding her physical health.  Susan [name] [facility] 05/11/2015, 9:39 PM

## 2015-05-11 NOTE — Progress Notes (Signed)
Pt up at nurses station C/O feeling dizzy, sweaty. VS obtained BP 120/80, pulse 60, 100% RA, BG 288 mg/dl. NP notified. "I feel better now."  Will continue to monitor.

## 2015-05-11 NOTE — Progress Notes (Addendum)
Patient extremely hypotensive on arrival to [facility]. SBP 60-70s (dynamap and manual assessment). Last reported by from [facility] RN 106/50 approx. [phone]. Patient reporting dizziness, headache 8/10 and slow to respond to questions. She was noted to fall asleep several times during assessment. CBG 220s. On call extender notified. Pt had been given atenolol, clonidine, Vasotec, Neurontin and Seroquel between the hours of 1900-2100 prior to being admitted to [facility]. Plan is now for patient to return to [facility] for IVF hydration. AC Herbert Spires and [facility] ED charge nurse Lattie Haw notified of physician extender's orders. Awaiting [facility] for transport at this time.
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
## 2015-05-11 NOTE — Progress Notes (Signed)
Pt to facility from [facility]. Bed assignment 501/1.

## 2015-05-11 NOTE — Progress Notes (Signed)
D: Susan [name] has been participating in groups today and this evening. She is still endorsing anxiety 6/10  and depression 10/10. Endorses passive SI. She contracts for safety. Denies HI/AVH.She is requesting medication for anxiety. She still does not feel safe to be alone at home by herself. States going to groups have been keeping her from sitting in her room and being sad or thinking about negative things.  A: Medications administered as prescribed. Encouraged [name] to continue to remain active and engaged on the unit.  R: Will continue to monitor patient for safety and medication effectiveness. [name]

## 2015-05-11 NOTE — Progress Notes (Signed)
NUTRITION ASSESSMENT  Pt identified as at risk on the Malnutrition Screen Tool  INTERVENTION: 1. Educated patient on the importance of nutrition and encouraged intake of food and beverages. 2. Discussed weight goals. 3. Supplements: will order Ensure Enlive BID, each supplement provides 350 kcal and 20 grams of protein  NUTRITION DIAGNOSIS: Unintentional weight loss related to sub-optimal intake as evidenced by pt report.   Goal: Pt to meet >/= 90% of their estimated nutrition needs.  Monitor:  PO intake  Assessment:  Pt seen for MST. Pt admitted with SI/HI. Per chart review, pt has lost 18 lbs (6% body weight) in the past 4 months which is not significant for time frame.  Will order Ensure to assist during times of decreased appetite to maintain muscle mass.     53 y.o. female  Height: Ht Readings from Last 1 Encounters:  05/11/15 5\' 3"  (1.6 m)    Weight: Wt Readings from Last 1 Encounters:  05/11/15 265 lb (120.203 kg)    Weight Hx: Wt Readings from Last 10 Encounters:  05/11/15 265 lb (120.203 kg)  04/17/15 274 lb (124.286 kg)  01/30/15 293 lb (132.904 kg)  01/29/15 292 lb 8.8 oz (132.7 kg)  01/24/15 305 lb (138.347 kg)  01/13/15 283 lb (128.368 kg)  11/24/14 300 lb (136.079 kg)  09/14/14 289 lb (131.09 kg)  06/02/14 285 lb (129.275 kg)  05/19/14 281 lb (127.461 kg)    BMI:  Body mass index is 46.95 kg/(m^2). Pt meets criteria for morbid obesity based on current BMI.  Estimated Nutritional Needs: Kcal: 25-30 kcal/kg Protein: > 1 gram protein/kg Fluid: 1 ml/kcal  Diet Order:   Pt is also offered choice of unit snacks mid-morning and mid-afternoon.  Pt is eating as desired.   Lab results and medications reviewed.       Jarome Matin, RD, LDN Inpatient Clinical Dietitian Pager # [phone] After hours/weekend pager # [phone]

## 2015-05-12 DIAGNOSIS — F4310 Post-traumatic stress disorder, unspecified: Secondary | ICD-10-CM | POA: Diagnosis present

## 2015-05-12 LAB — GLUCOSE, CAPILLARY
GLUCOSE-CAPILLARY: 315 mg/dL — AB (ref 65–99)
GLUCOSE-CAPILLARY: 267 mg/dL — AB (ref 65–99)
Glucose-Capillary: 239 mg/dL — ABNORMAL HIGH (ref 65–99)
Glucose-Capillary: 215 mg/dL — ABNORMAL HIGH (ref 65–99)

## 2015-05-12 MED ORDER — ENALAPRIL MALEATE 10 MG PO TABS
20.0000 mg | ORAL_TABLET | Freq: Every day | ORAL | Status: DC
  Administered 2015-05-12 – 2015-05-17 (×6): 20 mg via ORAL
  Filled 2015-05-12 (×2): qty 1
  Filled 2015-05-12: qty 2
  Filled 2015-05-12 (×2): qty 1
  Filled 2015-05-12 (×2): qty 2
  Filled 2015-05-12: qty 1
  Filled 2015-05-12: qty 2

## 2015-05-12 MED ORDER — QUETIAPINE FUMARATE 400 MG PO TABS
400.0000 mg | ORAL_TABLET | Freq: Every day | ORAL | Status: DC
  Administered 2015-05-12 – 2015-05-16 (×5): 400 mg via ORAL
  Filled 2015-05-12 (×7): qty 1

## 2015-05-12 MED ORDER — AMLODIPINE BESYLATE 5 MG PO TABS: 5.0000 mg | ORAL_TABLET | Freq: Every day | ORAL | Status: DC

## 2015-05-12 MED ORDER — INSULIN GLARGINE 100 UNIT/ML ~~LOC~~ SOLN
24.0000 [IU] | Freq: Every day | SUBCUTANEOUS | Status: DC
  Administered 2015-05-12 – 2015-05-16 (×5): 24 [IU] via SUBCUTANEOUS

## 2015-05-12 MED ORDER — AMLODIPINE BESYLATE 10 MG PO TABS
10.0000 mg | ORAL_TABLET | Freq: Every day | ORAL | Status: DC
  Administered 2015-05-13 – 2015-05-17 (×5): 10 mg via ORAL
  Filled 2015-05-12 (×7): qty 1

## 2015-05-12 NOTE — [facility] Group Notes (Signed)
[facility] LCSW Group Therapy  05/12/2015 1:15 pm  Type of Therapy: Process Group Therapy  Participation Level:  Active  Participation Quality:  Appropriate  Affect:  Flat  Cognitive:  Oriented  Insight:  Improving  Engagement in Group:  Limited  Engagement in Therapy:  Limited  Modes of Intervention:  Activity, Clarification, Education, Problem-solving and Support  Summary of Progress/Problems: Today's group addressed the issue of overcoming obstacles.  Patients were asked to identify their biggest obstacle post d/c that stands in the way of their on-going success, and then problem solve as to how to manage this.  In and out several times, but engaged while with [place].  Started out as helpless, hopeless.  "I just can't learn to let go."  But later, as others were discussing their challenges with their families, her tune changed.  Stated she had gotten a gift from her daughter today, who called to tell her that she was worried about her and wanted her to focus on getting better.  "So I know she really cares about me."  Later admitted that "sometimes I make my life more complicated that it needs to be," and talked about the importance of "nurturing my faith" as instructed by her aunt.  Trish Mage 05/12/2015   3:18 PM

## 2015-05-12 NOTE — [facility] Group Notes (Signed)
[facility] Group Notes:  (Nursing/MHT/Case Management/Adjunct)  Date:  05/12/2015  Time:  0930 Type of Therapy:  Nurse Education  /  Leisure Time : The group is focused on teaching patients the importance of incorporating leisure time into their lives and how benenficial it can be to maintain their mental health.  Participation Level:  Did Not Attend  Participation Quality:    Affect:    Cognitive:    Insight:    Engagement in Group:   Modes of Intervention:    Summary of Progress/Problems:  Lauralyn Primes 05/12/2015, 6:30 PM

## 2015-05-12 NOTE — Progress Notes (Signed)
DAR Note Patient presents with sad and depressed affect.  Rates depression, hopelessness, and anxiety at 10.  Endorses command auditory hallucination telling her to harm herself.  Patient able to contracts for safety.  Complain of headache as a result of the constant voices in her head.  Received tylenol 650 mg with fair effect.  Maintained on routine safety checks per protocol.  Support and encouragement offered as needed.  Maintained on blood glucose monitoring.  Reports poor appetite.  Glucerna and fluid intake encouraged.  No s/s of hypo/hyperglycemic reaction noted. Attended group.  Compliant with medication and treatment plan.
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
## 2015-05-12 NOTE — Plan of Care (Signed)
Problem: Alteration in mood Goal: LTG-Patient reports reduction in suicidal thoughts (Patient reports reduction in suicidal thoughts and is able to verbalize a safety plan for whenever patient is feeling suicidal)  Outcome: Not Progressing Pt continues to report SI with a plan to overdose.  She verbally contracts for safety.

## 2015-05-12 NOTE — Progress Notes (Signed)
D: Pt has anxious, depressed affect and mood.  Pt reports her day was "not too good."  She reports she is "messed up in my head, confused about a lot of things, I just wanna go to sleep and not wake up, the voices I'm hearing are telling me I'm no good, worthless."  Pt reports SI with a plan to overdose on medications.  She verbally contracts for safety and has no access to means.  Pt denies HI, denies visual hallucinations, reports bilateral neuropathic pain in her legs of 10/10.  Pt has been visible in milieu interacting with peers and staff appropriately.  Pt attended evening group.  Pt's HS CBG was 215 mg/dL.  A: Introduced self to pt.  Met with pt 1:1 and provided support and encouragement.  Actively listened to pt.  Medications administered per order.  PRN medication administered for pain and anxiety.  Encouraged pt to rest.  R: Pt is compliant with medications.  Pt verbally contracts for safety and reports that she will notify staff of needs and concerns.  Will continue to monitor and assess.

## 2015-05-13 LAB — BASIC METABOLIC PANEL
ANION GAP: 10 (ref 5–15)
BUN: 8 mg/dL (ref 6–20)
CALCIUM: 9 mg/dL (ref 8.9–10.3)
CO2: 25 mmol/L (ref 22–32)
CREATININE: 0.63 mg/dL (ref 0.44–1.00)
Chloride: 102 mmol/L (ref 101–111)
GLUCOSE: 250 mg/dL — AB (ref 65–99)
Potassium: 4.4 mmol/L (ref 3.5–5.1)
Sodium: 137 mmol/L (ref 135–145)

## 2015-05-13 LAB — GLUCOSE, CAPILLARY
GLUCOSE-CAPILLARY: 260 mg/dL — AB (ref 65–99)
GLUCOSE-CAPILLARY: 331 mg/dL — AB (ref 65–99)
Glucose-Capillary: 304 mg/dL — ABNORMAL HIGH (ref 65–99)
Glucose-Capillary: 283 mg/dL — ABNORMAL HIGH (ref 65–99)

## 2015-05-13 LAB — LIPID PANEL
Cholesterol: 220 mg/dL — ABNORMAL HIGH (ref 0–200)
HDL: 49 mg/dL (ref >40)
LDL CALC: 130 mg/dL — AB (ref 0–99)
Total CHOL/HDL Ratio: 4.5 RATIO
Triglycerides: 207 mg/dL — ABNORMAL HIGH (ref <150)
VLDL: 41 mg/dL — AB (ref 0–40)

## 2015-05-13 LAB — TSH: TSH: 1.53 u[IU]/mL (ref 0.350–4.500)

## 2015-05-13 MED ORDER — CLONIDINE HCL 0.1 MG PO TABS
0.1000 mg | ORAL_TABLET | Freq: Three times a day (TID) | ORAL | Status: DC
  Administered 2015-05-13 – 2015-05-17 (×13): 0.1 mg via ORAL
  Filled 2015-05-13 (×21): qty 1

## 2015-05-13 MED ORDER — SIMVASTATIN 20 MG PO TABS
20.0000 mg | ORAL_TABLET | Freq: Every day | ORAL | Status: DC
  Administered 2015-05-13 – 2015-05-16 (×4): 20 mg via ORAL
  Filled 2015-05-13 (×6): qty 1

## 2015-05-13 MED ORDER — ELVITEG-COBIC-EMTRICIT-TENOFAF 150-150-200-10 MG PO TABS
1.0000 | ORAL_TABLET | Freq: Every day | ORAL | Status: DC
  Administered 2015-05-13 – 2015-05-17 (×5): 1 via ORAL
  Filled 2015-05-13 (×7): qty 1

## 2015-05-13 MED ORDER — VENLAFAXINE HCL ER 75 MG PO CP24
75.0000 mg | ORAL_CAPSULE | Freq: Every day | ORAL | Status: DC
  Administered 2015-05-14 – 2015-05-16 (×3): 75 mg via ORAL
  Filled 2015-05-13 (×2): qty 1
  Filled 2015-05-13: qty 2
  Filled 2015-05-13 (×2): qty 1

## 2015-05-13 NOTE — Progress Notes (Signed)
D) Pt has been attending the groups and interacting with her peers appropriately. Affect is flat and mood depressed. States that overall she is feeling a little better. Continues to have auditory hallucinations that "are buzzing and some ringing in my ears". Pt admits to thoughts of SI . Affect is brighter today and is joking with the staff. C/O constipation. Also, Pt's blood pressure was high at noon. Rates her depression, hopelessness and anxiety all at a 10. A) Pt given support and reassurance. Education provided about what increases her blood sugar and what helps it to maintain a healthy blood sugar. Pt's increased blood pressure issues brought to Dr. Shea Evans and the physician added some medications back to Pt's regime. Pt given support, reassurance and praise. Verbal agreement made with Pt for her safety while on the unit. R) Pt agrees to come to staff if she feels she is going to act on her feeling of SI.

## 2015-05-13 NOTE — Progress Notes (Signed)
[facility] Group Notes:  (Nursing/MHT/Case Management/Adjunct)  Date:  05/13/2015  Time:  8:58 PM  Type of Therapy:  Psychoeducational Skills  Participation Level:  Active  Participation Quality:  Appropriate  Affect:  Appropriate  Cognitive:  Appropriate  Insight:  Good  Engagement in Group:  Engaged  Modes of Intervention:  Education  Summary of Progress/Problems: The patient shared with the group that she had a good day once she found out that she has place to live following discharge. The patient verbalized that she was worried about her housing situation, but a new apartment has been found for her. As a theme for the day, her relapse prevention will include taking her medication as prescribed.   Archie Balboa S 05/13/2015, 8:58 PM

## 2015-05-13 NOTE — Progress Notes (Signed)
Writer has observed patient up in the dayroom watching tv and minimal interaction with peers. Writer spoke with her 1:1 and she reports that she has had good day and the good news she got about her apartment and speaking with her daughter on the phone helped to make her day. She reports that she no longer wants to die and realizes that her insulin overdose was a mistake and dangerous. Writer informed her of scheduled medications and offered support and praise for her good news today. Safety maintained on unit with 15 min checks.

## 2015-05-13 NOTE — [facility] Group Notes (Signed)
[facility] LCSW Group Therapy  05/13/2015  1:05 PM  Type of Therapy:  Group therapy  Participation Level:  Active  Participation Quality:  Attentive  Affect:  Flat  Cognitive:  Oriented  Insight:  Limited  Engagement in Therapy:  Limited  Modes of Intervention:  Discussion, Socialization  Summary of Progress/Problems:  Chaplain was here to lead a group on themes of hope and courage. "I have not been feeling hope, but I came back here because it helped in the past.  I heard from my daughter who told me she cared for me in so many words, and that helped.  Also, I was told I will be able to move into my new place soon. So I have things to be hopeful about."  Restated this multiple times during the course of the group.  Mood is now much better. Susan [name] 05/13/2015 1:23 PM
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
## 2015-05-14 LAB — PROLACTIN: Prolactin: 10.5 ng/mL (ref 4.8–23.3)

## 2015-05-14 LAB — GLUCOSE, CAPILLARY
GLUCOSE-CAPILLARY: 207 mg/dL — AB (ref 65–99)
GLUCOSE-CAPILLARY: 258 mg/dL — AB (ref 65–99)
GLUCOSE-CAPILLARY: 330 mg/dL — AB (ref 65–99)
GLUCOSE-CAPILLARY: 260 mg/dL — AB (ref 65–99)

## 2015-05-14 MED ORDER — GABAPENTIN 300 MG PO CAPS
300.0000 mg | ORAL_CAPSULE | Freq: Three times a day (TID) | ORAL | Status: DC
  Administered 2015-05-14 – 2015-05-16 (×6): 300 mg via ORAL
  Filled 2015-05-14 (×8): qty 1
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
## 2015-05-14 NOTE — Progress Notes (Signed)
Writer has observed patient up in the dayroom watching tv and interacting appropriately with peers. She reported to Probation officer as having a good day and her daughter brought her some belongings today. She attended group this evening and participated, compliant with medications. She denies having suicidal thoughts today and denies hearing voices to Probation officer, -hi and visual hallucinations. Support and encouragement given, safety maintained on unit with 15 min checks.

## 2015-05-14 NOTE — [facility] Group Notes (Signed)
[place] Group Notes:  (Clinical Social Work)  05/14/2015  11:15-12:00PM  Summary of Progress/Problems:   The main focus of today's process group was to discuss patients' feelings related to being hospitalized, as well as the difference between "being" and "having" a mental health diagnosis.  It was agreed in general by the group that it would be preferable to avoid future hospitalizations, and we discussed means of doing that.  As a follow-up, problems with adhering to medication recommendations were discussed.  The patient expressed their primary feeling about being hospitalized is that at first she wanted to go home, but in the last few days she has come to regard it as a safe haven where she can not only get better, but also get education on her medications.  She stated her method of staying out of the hospital will be to take her medications as precribed and find a support system, i.e. Support groups.  Type of Therapy:  Group Therapy - Process  Participation Level:  Active  Participation Quality:  Appropriate, Attentive, Sharing and Supportive  Affect:  Blunted  Cognitive:  Alert, Appropriate and Oriented  Insight:  Engaged  Engagement in Therapy:  Engaged  Modes of Intervention:  Exploration, Discussion  Selmer Dominion, LCSW 05/14/2015, 12:01 PM

## 2015-05-14 NOTE — Plan of Care (Signed)
Problem: Diagnosis: Increased Risk For Suicide Attempt Goal: STG-Patient Will Attend All Groups On The Unit Outcome: Progressing Patient attended group this evening and participated.     

## 2015-05-14 NOTE — Progress Notes (Signed)
Attended wrap up group and stated that her day was a 8, she had a good day but without her family it wasn't complete. She said the positive part of her day was interacting with her peers and she was able to share her story with a visiting nurse Production assistant, radio).

## 2015-05-15 LAB — GLUCOSE, CAPILLARY
GLUCOSE-CAPILLARY: 393 mg/dL — AB (ref 65–99)
Glucose-Capillary: 207 mg/dL — ABNORMAL HIGH (ref 65–99)
Glucose-Capillary: 294 mg/dL — ABNORMAL HIGH (ref 65–99)
Glucose-Capillary: 328 mg/dL — ABNORMAL HIGH (ref 65–99)

## 2015-05-15 NOTE — Plan of Care (Signed)
Problem: Diagnosis: Increased Risk For Suicide Attempt Goal: STG-Patient Will Comply With Medication Regime Outcome: Progressing Patient is compliant with scheduled hs medication.     

## 2015-05-15 NOTE — Progress Notes (Signed)
D) Pt has attended the program and participates in the groups. Interacts with her peers appropriately and has insight into her issues and behaviors. Has shared with staff that she feels she is her own worst enemy and wants very much to change that.  A) Pt. Educated about her diet and how to follow it. Provided with a one to one throughout the day. Therapeutic humor used.  R) Pt interacting well and states that she is ready to go home. Affect and mood appropriate.
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
## 2015-05-15 NOTE — BHH Group Notes (Signed)
[place] Group Notes:  (Clinical Social Work)  05/15/2015  [place] Group Notes:  (Clinical Social Work)  05/15/2015  11:00AM-12:00PM  Summary of Progress/Problems:  The main focus of today's process group was to listen to a variety of genres of music and to identify that different types of music provoke different responses.  The patient then was able to identify personally what was soothing for them, as well as energizing.  Handouts were used to record feelings evoked, as well as how patient can personally use this knowledge in sleep habits, with depression, and with other symptoms.  The patient expressed understanding of concepts, as well as knowledge of how each type of music affected her and how this can be used at home as a wellness/recovery tool.  Type of Therapy:  Music Therapy   Participation Level:  Active  Participation Quality:  Attentive and Sharing  Affect:   Appropriate  Cognitive:  Oriented  Insight:  Engaged  Engagement in Therapy:  Engaged  Modes of Intervention:   Activity, Exploration  Selmer Dominion, LCSW 05/15/2015

## 2015-05-15 NOTE — Progress Notes (Signed)
Writer has observed patient up in t he dayroom watching tv and interacting appropriately with peers. She was compliant with her medications and was concerned with her blood sugar being so high this evening. Writer reminded her about the cookies she had eaten at dinner time and she laughed and reported "they told you." She is looking forward to discharge so that she can move into her new apartment. She denies si/hi/a/v hallucinations. Support given and safety maintained on unit with 15 min checks.

## 2015-05-15 NOTE — Progress Notes (Signed)
[place] Group Notes:  (Nursing/MHT/Case Management/Adjunct)  Date:  05/15/2015  Time:  9:07 PM  Type of Therapy:  Psychoeducational Skills  Participation Level:  Active  Participation Quality:  Attentive  Affect:  Appropriate  Cognitive:  Appropriate  Insight:  Improving  Engagement in Group:  Improving  Modes of Intervention:  Education  Summary of Progress/Problems: Patient described her day as having been "very interesting". When asked to share something about her day, she replied that she learned how to deal with her feelings today. As a theme for the day, her support system will be comprised of [place].   Archie Balboa S 05/15/2015, 9:07 PM

## 2015-05-16 LAB — GLUCOSE, CAPILLARY
GLUCOSE-CAPILLARY: 205 mg/dL — AB (ref 65–99)
GLUCOSE-CAPILLARY: 336 mg/dL — AB (ref 65–99)
Glucose-Capillary: 297 mg/dL — ABNORMAL HIGH (ref 65–99)
Glucose-Capillary: 338 mg/dL — ABNORMAL HIGH (ref 65–99)

## 2015-05-16 MED ORDER — GABAPENTIN 400 MG PO CAPS
400.0000 mg | ORAL_CAPSULE | Freq: Three times a day (TID) | ORAL | Status: DC
  Administered 2015-05-16 – 2015-05-17 (×3): 400 mg via ORAL
  Filled 2015-05-16 (×8): qty 1

## 2015-05-16 MED ORDER — VENLAFAXINE HCL ER 150 MG PO CP24
150.0000 mg | ORAL_CAPSULE | Freq: Every day | ORAL | Status: DC
  Administered 2015-05-17: 150 mg via ORAL
  Filled 2015-05-16 (×3): qty 1

## 2015-05-16 NOTE — B[facility] Group Notes (Signed)
[place] LCSW Group Therapy  05/16/2015 3:06 PM   Type of Therapy:  Group Therapy  Participation Level:  Active  Participation Quality:  Attentive  Affect:  Appropriate  Cognitive:  Appropriate  Insight:  Improving  Engagement in Therapy:  Engaged  Modes of Intervention:  Clarification, Education, Exploration and Socialization  Summary of Progress/Problems: Today's group focused on relapse prevention.  We defined the term, and then brainstormed on ways to prevent relapse.  Stayed for the entire group.  Engaged throughout.  Talked about her recovery in terms of bing a "never-ending process"  that includes both "taking my medication as prescribed, and not using any drugs."  While she has been clean from heroin for 8 years, described the cravings that she sometimes still has in detail.  Gave others both positive feedback, and validated their reality by stating that she  knows exactly what they are going through. Roque Lias B 05/16/2015 , 3:06 PM

## 2015-05-16 NOTE — Tx Team (Signed)
Interdisciplinary Treatment Plan Update (Adult)  Date:  05/16/2015   Time Reviewed:  8:54 AM   Progress in Treatment: Attending groups: Yes. Participating in groups:  Yes. Taking medication as prescribed:  Yes. Tolerating medication:  Yes. Family/Significant othe contact made:  No Patient understands diagnosis:  Yes  As evidenced by seeking help with hopelessness, getting back on meds Discussing patient identified problems/goals with staff:  Yes, see initial care plan. Medical problems stabilized or resolved:  Yes. Denies suicidal/homicidal ideation: Yes. Issues/concerns per patient self-inventory:  No. Other:  New problem(s) identified:  Discharge Plan or Barriers:  Return home, follow up outpt  Reason for Continuation of Hospitalization:   Comments:  Susan [name] is an 54 y.o. female. Presenting to [facility] ED with SI and HI that became severe earlier today. Pt reports she told her daughter she was feeling unsafe, so daughter took pt's medications away from her. Pt then attempted to overdose on her insulin. Pt reports this is her 6th suicide attempt since age 35. Pt reports she is have command AH to harm herself by jumping off a bridge or to choke her dtr. Pt reports she has been very stressed and overwhelmed, feeling like she is responsible to make others (her children) act cordially and she is overwhelmed by this. Pt reports she was off her medications for a week, until today because [name] will no longer see her due to her insurance. Pt reports she has been using a little THC the last couple of days due to pain. She denies other alcohol or drug use, and reports she has been in recovery from heroin abuse for 6 years. Speech in logical and coherent. Mood is depressed and anxious with appropriate affect. Pt is unable to contract for safety towards self and others at this time. No hx of self injury.  Neurontin, Seroquel, Effexor trial  Estimated length of stay: Likely d/c tomorrow  New  goal(s):  Review of initial/current patient goals per problem list:   Review of initial/current patient goals per problem list:  1. Goal(s): Patient will participate in aftercare plan   Met: Yes Target date: 3-5 days post admission date   As evidenced by: Patient will participate within aftercare plan AEB aftercare provider and housing plan at discharge being identified.  05/16/2015:Pt plans to return home, follow up outpt.   2. Goal (s): Patient will exhibit decreased depressive symptoms and suicidal ideations.   Met: Yes  Target date: 3-5 days post admission date   As evidenced by: Patient will utilize self rating of depression at 3 or below and demonstrate decreased signs of depression or be deemed stable for discharge by MD. 05/11/15 Pt attempted suicide prior to admission.  Rates depression a 10 today 05/16/2015 Denies depression today   Goal(s): Patient will demonstrate decreased signs of psychosis  * Met: Yes   Target date: 3-5 days post admission date  * As evidenced by: Patient will demonstrate decreased frequency of AVH or return to baseline function 05/11/15  Pt has been non-compliant with meds, follow up appointments, c/o AH 05/16/2015 No signs nor symptoms of psychosis today.   Attendees: Patient:  05/16/2015 8:54 AM   Family:   05/16/2015 8:54 AM   Physician:  Ursula Alert, MD 05/16/2015 8:54 AM   Nursing:   Leonia Reader, RN 05/16/2015 8:54 AM   CSW:    Roque Lias, LCSW   05/16/2015 8:54 AM   Other:  05/16/2015 8:54 AM   Other:   05/16/2015 8:54 AM  Other:  Lars Pinks, Nurse CM 05/16/2015 8:54 AM   Other:  Lucinda Dell, [facility] 05/16/2015 8:54 AM   Other:  Norberto Sorenson, Dejan Angert [place]  05/16/2015 8:54 AM   Other:  05/16/2015 8:54 AM   Other:  05/16/2015 8:54 AM   Other:  05/16/2015 8:54 AM   Other:  05/16/2015 8:54 AM   Other:  05/16/2015 8:54 AM   Other:   05/16/2015 8:54 AM    Scribe for Treatment Team:   Trish Mage, 05/16/2015 8:54 AM

## 2015-05-16 NOTE — Plan of Care (Signed)
Problem: Ineffective individual coping Goal: STG: Patient will remain free from self harm Outcome: Progressing Patient has remained free from self harm and currently denies suicidal ideations,

## 2015-05-16 NOTE — Progress Notes (Signed)
D: Pt has appropriate affect and pleasant mood.  She reports she has been "talking about going home tomorrow."  She reports she feels safe to discharge tomorrow.  When asked about her aftercare plans, pt reports "they found me a psychiatrist here in this building, I'll be in my brand new apartment, and I talked to my HIV counselor."  Pt reports she "feels better" today.  Pt denies SI/HI, denies hallucinations, denies pain.  Pt has been visible in milieu interacting with peers and staff appropriately, smiling occasionally.  Pt attended evening group.  Pt's CBG at HS was 336 mg/dL. A: Introduced self to pt.  Met with pt 1:1 and provided support and encouragement.  Actively listened to pt.  Medications administered per order.   R: Pt is compliant with medications.  Pt verbally contracts for safety and reports that she will notify staff of needs and concerns.  Will continue to monitor and assess.

## 2015-05-16 NOTE — Plan of Care (Signed)
Problem: Ineffective individual coping Goal: LTG: Patient will report a decrease in negative feelings Outcome: Progressing Pt reports feeling "better" today.  She has been pleasant, occasionally smiling.  She reports she is glad to be discharging tomorrow and that she feels safe to discharge.

## 2015-05-16 NOTE — Progress Notes (Signed)
[place] Group Notes:  (Nursing/MHT/Case Management/Adjunct)  Date:  05/16/2015  Time:  9:04 PM  Type of Therapy:  Psychoeducational Skills  Participation Level:  Active  Participation Quality:  Appropriate  Affect:  Appropriate  Cognitive:  Appropriate  Insight:  Appropriate  Engagement in Group:  Engaged  Modes of Intervention:  Education  Summary of Progress/Problems: The patient shared with the group this evening that she was beginning to feel better and that she anticipates being discharged tomorrow. She mentioned that she had a good talk with her grandchildren as well. As a theme for the day, her wellness strategy will be to drink more water.   Archie Balboa S 05/16/2015, 9:04 PM

## 2015-05-16 NOTE — Progress Notes (Signed)
Pt presents with depressed mood, but brightens on approach. Susan [name] states she is '' doing well '' reports eating and sleeping well and states she has no concerns this am. She has been interactive on the unit and compliant with am medications with Probation officer. She denies any SI/HI/A/V/ Hallucinations.A. Support , encouragement provided. Discussed pt progress and plan of care in treatment team this am with Susan [name] and staff. R. Patient is safe, in no acute distress at this time. Will continue to monitor q 15 minutes as ordered.
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
## 2015-05-17 DIAGNOSIS — E1065 Type 1 diabetes mellitus with hyperglycemia: Secondary | ICD-10-CM | POA: Insufficient documentation

## 2015-05-17 LAB — GLUCOSE, CAPILLARY
GLUCOSE-CAPILLARY: 316 mg/dL — AB (ref 65–99)
Glucose-Capillary: 196 mg/dL — ABNORMAL HIGH (ref 65–99)

## 2015-05-17 MED ORDER — OMEPRAZOLE 20 MG PO CPDR: 20.0000 mg | DELAYED_RELEASE_CAPSULE | Freq: Every day | ORAL | Status: DC

## 2015-05-17 MED ORDER — QUETIAPINE FUMARATE 400 MG PO TABS: 400.0000 mg | ORAL_TABLET | Freq: Every day | ORAL | Status: DC

## 2015-05-17 MED ORDER — STRIBILD 150-150-200-300 MG PO TABS: 1.0000 | ORAL_TABLET | Freq: Every day | ORAL | Status: DC

## 2015-05-17 MED ORDER — ENALAPRIL MALEATE 20 MG PO TABS: 20.0000 mg | ORAL_TABLET | Freq: Every day | ORAL | Status: DC

## 2015-05-17 MED ORDER — INSULIN ASPART PROT & ASPART (70-30 MIX) 100 UNIT/ML ~~LOC~~ SUSP: 20.0000 [IU] | Freq: Two times a day (BID) | SUBCUTANEOUS | Status: DC

## 2015-05-17 MED ORDER — VENLAFAXINE HCL ER 150 MG PO CP24: 150.0000 mg | ORAL_CAPSULE | Freq: Every day | ORAL | Status: DC

## 2015-05-17 MED ORDER — QUETIAPINE FUMARATE 100 MG PO TABS
100.0000 mg | ORAL_TABLET | Freq: Every day | ORAL | Status: DC
Start: 2015-05-17 — End: 2015-09-22

## 2015-05-17 MED ORDER — GENVOYA 150-150-200-10 MG PO TABS: 1.0000 | ORAL_TABLET | Freq: Every day | ORAL | Status: DC

## 2015-05-17 MED ORDER — AMLODIPINE BESYLATE 10 MG PO TABS: 10.0000 mg | ORAL_TABLET | Freq: Every day | ORAL | Status: DC

## 2015-05-17 MED ORDER — FLUCONAZOLE 150 MG PO TABS
150.0000 mg | ORAL_TABLET | Freq: Once | ORAL | Status: AC
  Administered 2015-05-17: 150 mg via ORAL
  Filled 2015-05-17: qty 1

## 2015-05-17 MED ORDER — GABAPENTIN 400 MG PO CAPS: 400.0000 mg | ORAL_CAPSULE | Freq: Three times a day (TID) | ORAL | Status: DC

## 2015-05-17 NOTE — [facility] Suicide Risk Assessment (Signed)
[facility] Discharge Suicide Risk Assessment   Demographic Factors:  NA  Total Time spent with patient: 30 minutes  Musculoskeletal: Strength & Muscle Tone: within normal limits Gait & Station: normal Patient leans: N/A  Psychiatric Specialty Exam: Physical Exam  Review of Systems  Psychiatric/Behavioral: Negative for depression, suicidal ideas and hallucinations. The patient is not nervous/anxious and does not have insomnia.   All other systems reviewed and are negative.   Blood pressure 152/95, pulse 95, temperature 98 F (36.7 C), temperature source Oral, resp. rate 16, height 5\' 3"  (1.6 m), weight 120.203 kg (265 lb), last menstrual period 09/09/2014, SpO2 100 %.Body mass index is 46.95 kg/(m^2).  General Appearance: Casual  Eye Contact::  Good  Speech:  Clear and Coherent409  Volume:  Normal  Mood:  Euthymic  Affect:  Appropriate  Thought Process:  Coherent  Orientation:  Full (Time, Place, and Person)  Thought Content:  WDL  Suicidal Thoughts:  No  Homicidal Thoughts:  No  Memory:  Immediate;   Fair Recent;   Fair Remote;   Fair  Judgement:  Fair  Insight:  Fair  Psychomotor Activity:  Normal  Concentration:  Fair  Recall:  [employer] of Knowledge:Fair  Language: Fair  Akathisia:  No  Handed:  Right  AIMS (if indicated):     Assets:  Communication Skills Desire for Improvement  Sleep:  Number of Hours: 7.25  Cognition: WNL  ADL's:  Intact   Have you used any form of tobacco in the last 30 days? (Cigarettes, Smokeless Tobacco, Cigars, and/or Pipes): Yes  Has this patient used any form of tobacco in the last 30 days? (Cigarettes, Smokeless Tobacco, Cigars, and/or Pipes) Yes, A prescription for an FDA-approved tobacco cessation medication was offered at discharge and the patient refused  Mental Status Per Nursing Assessment::   On Admission:  Suicidal ideation indicated by patient, Self-harm thoughts  Current Mental Status by Physician: pt denies  SI/HI/AH/VH  Loss Factors: NA  Historical Factors: Impulsivity  Risk Reduction Factors:   Living with another person, especially a relative and Positive social support  Continued Clinical Symptoms:  Previous Psychiatric Diagnoses and Treatments Medical Diagnoses and Treatments/Surgeries  Cognitive Features That Contribute To Risk:  None    Suicide Risk:  Minimal: No identifiable suicidal ideation.  Patients presenting with no risk factors but with morbid ruminations; may be classified as minimal risk based on the severity of the depressive symptoms  Principal Problem: MDD (major depressive disorder), recurrent, severe, with psychosis Discharge Diagnoses:  Patient Active Problem List   Diagnosis Date Noted  . PTSD (post-traumatic stress disorder) [F43.10] 05/12/2015  . MDD (major depressive disorder), recurrent, severe, with psychosis [F33.3] 05/11/2015  . Hypotension [I95.9] 05/11/2015  . Cannabis use disorder, severe, dependence [F12.20] 05/11/2015  . Tobacco use disorder [Z72.0] 05/11/2015  . Diabetes type 2, uncontrolled [E11.65] 04/18/2015  . Dehydration [E86.0] 04/17/2015  . Hyperglycemia [R73.9] 04/17/2015  . Bacterial vaginitis [N76.0, A49.9] 04/17/2015  . Candidal vaginitis [B37.3] 04/17/2015  . Intertrigo [L30.4] 04/17/2015  . Diabetes mellitus type 2 without retinopathy [E11.9] 01/28/2015  . Depression, major, recurrent [F33.9] 06/18/2013  . Unspecified hereditary and idiopathic peripheral neuropathy [G60.9] 06/03/2013  . Benign colon polyp [K63.5] 01/02/2013  . Dysfunctional uterine bleeding [N93.8] 06/04/2012  . Dyslipidemia [E78.5]   . Menometrorrhagia [N92.1]   . GERD (gastroesophageal reflux disease) [K21.9]   . Diabetes [E11.9] 02/13/2012  . Morbid obesity [E66.01] 04/26/2011  . HIV disease [B20] 03/28/2011  . Hypertension [I10] 03/28/2011  Follow-up Information    Follow up with [facility] On 05/26/2015.   Why:  Thursday at 10:00 for a  10:30 appointment with Dr Robet Leu    Please bring your completed paperwork with you.   Contact information:   [place] recommendations:  Activity:  No restrictions Diet:  carb modified Tests:  as needed Other:  follow up with out pt provider  Is patient on multiple antipsychotic therapies at discharge:  No   Has Patient had three or more failed trials of antipsychotic monotherapy by history:  No  Recommended Plan for Multiple Antipsychotic Therapies: NA    Chord Takahashi md 05/17/2015, 9:33 AM

## 2015-05-17 NOTE — Progress Notes (Signed)
Discharge note: Pt discharged per MD order. Discharge summary reviewed with pt. Pt verbalizes and signs understanding of discharge summary. Pt denies SI/HI, AVH at discharge. Pt denies physical pain at discharge. RX and follow up appointment packet given to pt at discharge. All personal items returned to pt from locker # 6. Pt verbalizes and signs that she reviewed all personal items. Ambulatory into lobby .

## 2015-05-17 NOTE — [facility] Group Notes (Signed)
[facility] Group Notes:  (Nursing/MHT/Case Management/Adjunct)  Date:  05/17/2015  Time: 0930  Type of Therapy:  Nurse Education  Participation Level:  Active  Participation Quality:  Appropriate, Sharing and Supportive  Affect:  Appropriate  Cognitive:  Alert and Appropriate  Insight:  Appropriate  Engagement in Group:  Engaged  Modes of Intervention:  Activity, Education, Socialization and Support  Summary of Progress/Problems:  Forrestine Him 05/17/2015, 10:14 AM

## 2015-05-17 NOTE — [facility] Suicide Risk Assessment (Signed)
[facility] INPATIENT:  Family/Significant Other Suicide Prevention Education  Suicide Prevention Education:  Contact Attempts: Margorie Renner, daughter, [phone] has been identified by the patient as the family member/significant other with whom the patient will be residing, and identified as the person(s) who will aid the patient in the event of a mental health crisis.  With written consent from the patient, two attempts were made to provide suicide prevention education, prior to and/or following the patient's discharge.  We were unsuccessful in providing suicide prevention education.  A suicide education pamphlet was given to the patient to share with family/significant other.  Date and time of first attempt: 05/13/2015  3:00 PM Date and time of second attempt: 05/17/2015 10:48 AM   Roque Lias B 05/17/2015, 10:47 AM
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
## 2015-05-17 NOTE — Progress Notes (Signed)
  [facility] Adult Case Management Discharge Plan :  Will you be returning to the same living situation after discharge:  Yes,  home At discharge, do you have transportation home?: Yes,  family Do you have the ability to pay for your medications: Yes,  MCR/MCD  Release of information consent forms completed and in the chart;  Patient's signature needed at discharge.  Patient to Follow up at: Follow-up Information    Follow up with [facility] On 05/26/2015.   Why:  Thursday at 10:00 for a 10:30 appointment with Dr Robet Leu    Please bring your completed paperwork with you.   Contact information:   [place] [phone]      Patient denies SI/HI: Yes,  yes    Safety Planning and Suicide Prevention discussed: Yes,  yes  Have you used any form of tobacco in the last 30 days? (Cigarettes, Smokeless Tobacco, Cigars, and/or Pipes): Yes  Has patient been referred to the Quitline?   Patient refused.  Roque Lias B 05/17/2015, 10:43 AM

## 2015-05-17 NOTE — Progress Notes (Signed)
D:Pt behavior calm and cooperative. Pleasant on approach. Pt denies SI/HI. Denies AVH. Pt denies physical pain. Pt does c/o s/s of yeast infection. Pt reports she is happy to be discharging today. Pt attended nursing group on the unit. Active participation in group. Supportive and sharing.   A:Special checks q 15 mins in place for safety. Medication administered per MD order (see eMAR). Encouragement and support provided. MD informed about pt s/s of yeast infection. Discharge planning in place.  R:Safety maintained, compliant with medication regimen. Will continue to monitor.

## 2015-05-26 ENCOUNTER — Ambulatory Visit (HOSPITAL_COMMUNITY): Payer: Self-pay | Admitting: Psychiatry

## 2015-05-31 ENCOUNTER — Inpatient Hospital Stay (HOSPITAL_COMMUNITY)
Admission: AD | Admit: 2015-05-31 | Discharge: 2015-06-01 | Disposition: A | Discharge status: Home / Self Care | Payer: Medicare Other | Source: Ambulatory Visit | Attending: Obstetrics and Gynecology | Admitting: Obstetrics and Gynecology

## 2015-05-31 DIAGNOSIS — B373 Candidiasis of vulva and vagina: Secondary | ICD-10-CM | POA: Insufficient documentation

## 2015-05-31 DIAGNOSIS — B3731 Acute candidiasis of vulva and vagina: Secondary | ICD-10-CM

## 2015-05-31 DIAGNOSIS — E1140 Type 2 diabetes mellitus with diabetic neuropathy, unspecified: Secondary | ICD-10-CM

## 2015-05-31 DIAGNOSIS — R1030 Lower abdominal pain, unspecified: Secondary | ICD-10-CM | POA: Insufficient documentation

## 2015-05-31 DIAGNOSIS — R109 Unspecified abdominal pain: Secondary | ICD-10-CM | POA: Insufficient documentation

## 2015-05-31 DIAGNOSIS — B20 Human immunodeficiency virus [HIV] disease: Secondary | ICD-10-CM | POA: Insufficient documentation

## 2015-05-31 DIAGNOSIS — E119 Type 2 diabetes mellitus without complications: Secondary | ICD-10-CM | POA: Insufficient documentation

## 2015-05-31 DIAGNOSIS — F17210 Nicotine dependence, cigarettes, uncomplicated: Secondary | ICD-10-CM | POA: Diagnosis not present

## 2015-05-31 DIAGNOSIS — L293 Anogenital pruritus, unspecified: Secondary | ICD-10-CM | POA: Diagnosis present

## 2015-05-31 NOTE — MAU Note (Signed)
Pt states for the last 2 days she has had abd pain and worsening pain in her lower legs from her diabetes.

## 2015-06-01 ENCOUNTER — Encounter (HOSPITAL_COMMUNITY): Payer: Self-pay

## 2015-06-01 DIAGNOSIS — B373 Candidiasis of vulva and vagina: Secondary | ICD-10-CM

## 2015-06-01 LAB — RAPID URINE DRUG SCREEN, HOSP PERFORMED
Amphetamines: NOT DETECTED
BARBITURATES: NOT DETECTED
BENZODIAZEPINES: NOT DETECTED
Cocaine: NOT DETECTED
Opiates: POSITIVE — AB
Tetrahydrocannabinol: POSITIVE — AB

## 2015-06-01 LAB — CBC WITH DIFFERENTIAL/PLATELET
Basophils Absolute: 0 10*3/uL (ref 0.0–0.1)
Basophils Relative: 1 %
EOS ABS: 0.5 10*3/uL (ref 0.0–0.7)
EOS PCT: 10 %
HEMATOCRIT: 38.5 % (ref 36.0–46.0)
Hemoglobin: 13.4 g/dL (ref 12.0–15.0)
LYMPHS ABS: 1.7 10*3/uL (ref 0.7–4.0)
LYMPHS PCT: 32 %
MCH: 31.4 pg (ref 26.0–34.0)
MCHC: 34.8 g/dL (ref 30.0–36.0)
MCV: 90.2 fL (ref 78.0–100.0)
MONO ABS: 0.4 10*3/uL (ref 0.1–1.0)
Monocytes Relative: 8 %
Neutro Abs: 2.5 10*3/uL (ref 1.7–7.7)
Neutrophils Relative %: 49 %
PLATELETS: 229 10*3/uL (ref 150–400)
RBC: 4.27 MIL/uL (ref 3.87–5.11)
RDW: 14.9 % (ref 11.5–15.5)
WBC: 5.2 10*3/uL (ref 4.0–10.5)

## 2015-06-01 LAB — URINALYSIS, ROUTINE W REFLEX MICROSCOPIC
Bilirubin Urine: NEGATIVE
GLUCOSE, UA: 100 mg/dL — AB
HGB URINE DIPSTICK: NEGATIVE
KETONES UR: 15 mg/dL — AB
LEUKOCYTES UA: NEGATIVE
Nitrite: NEGATIVE
PH: 5.5 (ref 5.0–8.0)
Protein, ur: NEGATIVE mg/dL
Specific Gravity, Urine: >1.03 — ABNORMAL HIGH (ref 1.005–1.030)
Urobilinogen, UA: 1 mg/dL (ref 0.0–1.0)

## 2015-06-01 LAB — COMPREHENSIVE METABOLIC PANEL
ALK PHOS: 96 U/L (ref 38–126)
ALT: 15 U/L (ref 14–54)
AST: 15 U/L (ref 15–41)
Albumin: 3.4 g/dL — ABNORMAL LOW (ref 3.5–5.0)
Anion gap: 9 (ref 5–15)
BILIRUBIN TOTAL: 0.5 mg/dL (ref 0.3–1.2)
BUN: 5 mg/dL — AB (ref 6–20)
CALCIUM: 8.8 mg/dL — AB (ref 8.9–10.3)
CHLORIDE: 104 mmol/L (ref 101–111)
CO2: 27 mmol/L (ref 22–32)
CREATININE: 0.85 mg/dL (ref 0.44–1.00)
Glucose, Bld: 179 mg/dL — ABNORMAL HIGH (ref 65–99)
Potassium: 3.7 mmol/L (ref 3.5–5.1)
Sodium: 140 mmol/L (ref 135–145)
TOTAL PROTEIN: 6.5 g/dL (ref 6.5–8.1)

## 2015-06-01 LAB — GLUCOSE, CAPILLARY: GLUCOSE-CAPILLARY: 170 mg/dL — AB (ref 65–99)

## 2015-06-01 LAB — POCT PREGNANCY, URINE: PREG TEST UR: NEGATIVE

## 2015-06-01 MED ORDER — FLUCONAZOLE 150 MG PO TABS
150.0000 mg | ORAL_TABLET | Freq: Once | ORAL | Status: AC
  Administered 2015-06-01: 150 mg via ORAL
  Filled 2015-06-01: qty 1

## 2015-06-01 MED ORDER — FLUCONAZOLE 150 MG PO TABS: 150.0000 mg | ORAL_TABLET | Freq: Every day | ORAL | Status: DC
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
## 2015-06-01 NOTE — Discharge Instructions (Signed)

## 2015-06-01 NOTE — MAU Note (Signed)
Pt states that she has had lower abdominal pain for 2 days. HIV + and prone to pain and yeast infections. Leg pain from diabetic neuropathy. Has appointment with PCP next week.

## 2015-06-02 ENCOUNTER — Telehealth: Payer: Self-pay | Admitting: Internal Medicine

## 2015-06-02 NOTE — Telephone Encounter (Signed)
Patient is still having pain in leg form diabetes.  She has finished flexeril.  She is requesting a call in regards.

## 2015-06-04 ENCOUNTER — Other Ambulatory Visit: Payer: Self-pay | Admitting: Internal Medicine

## 2015-06-06 NOTE — Telephone Encounter (Signed)
What kind of pain is she experiencing? Nerve related or other?

## 2015-06-07 ENCOUNTER — Emergency Department (HOSPITAL_COMMUNITY)
Admission: EM | Admit: 2015-06-07 | Discharge: 2015-06-08 | Disposition: A | Discharge status: Home / Self Care | Payer: Medicare Other | Attending: Emergency Medicine | Admitting: Emergency Medicine

## 2015-06-07 ENCOUNTER — Encounter (HOSPITAL_COMMUNITY): Payer: Self-pay | Admitting: Emergency Medicine

## 2015-06-07 DIAGNOSIS — E119 Type 2 diabetes mellitus without complications: Secondary | ICD-10-CM | POA: Diagnosis not present

## 2015-06-07 DIAGNOSIS — K219 Gastro-esophageal reflux disease without esophagitis: Secondary | ICD-10-CM | POA: Diagnosis not present

## 2015-06-07 DIAGNOSIS — G629 Polyneuropathy, unspecified: Secondary | ICD-10-CM | POA: Diagnosis not present

## 2015-06-07 DIAGNOSIS — Z8673 Personal history of transient ischemic attack (TIA), and cerebral infarction without residual deficits: Secondary | ICD-10-CM | POA: Diagnosis not present

## 2015-06-07 DIAGNOSIS — M25461 Effusion, right knee: Secondary | ICD-10-CM | POA: Diagnosis not present

## 2015-06-07 DIAGNOSIS — Z720 Tobacco use: Secondary | ICD-10-CM | POA: Insufficient documentation

## 2015-06-07 DIAGNOSIS — Z21 Asymptomatic human immunodeficiency virus [HIV] infection status: Secondary | ICD-10-CM | POA: Diagnosis not present

## 2015-06-07 DIAGNOSIS — Z794 Long term (current) use of insulin: Secondary | ICD-10-CM | POA: Insufficient documentation

## 2015-06-07 DIAGNOSIS — M792 Neuralgia and neuritis, unspecified: Secondary | ICD-10-CM | POA: Insufficient documentation

## 2015-06-07 DIAGNOSIS — M25462 Effusion, left knee: Secondary | ICD-10-CM | POA: Diagnosis not present

## 2015-06-07 DIAGNOSIS — M1990 Unspecified osteoarthritis, unspecified site: Secondary | ICD-10-CM | POA: Insufficient documentation

## 2015-06-07 DIAGNOSIS — E7800 Pure hypercholesterolemia, unspecified: Secondary | ICD-10-CM | POA: Insufficient documentation

## 2015-06-07 DIAGNOSIS — F419 Anxiety disorder, unspecified: Secondary | ICD-10-CM | POA: Diagnosis not present

## 2015-06-07 DIAGNOSIS — Z7951 Long term (current) use of inhaled steroids: Secondary | ICD-10-CM | POA: Diagnosis not present

## 2015-06-07 DIAGNOSIS — J45909 Unspecified asthma, uncomplicated: Secondary | ICD-10-CM | POA: Insufficient documentation

## 2015-06-07 DIAGNOSIS — M7989 Other specified soft tissue disorders: Secondary | ICD-10-CM | POA: Diagnosis present

## 2015-06-07 DIAGNOSIS — Z79899 Other long term (current) drug therapy: Secondary | ICD-10-CM | POA: Diagnosis not present

## 2015-06-07 DIAGNOSIS — I10 Essential (primary) hypertension: Secondary | ICD-10-CM | POA: Diagnosis not present

## 2015-06-07 DIAGNOSIS — F329 Major depressive disorder, single episode, unspecified: Secondary | ICD-10-CM | POA: Diagnosis not present

## 2015-06-07 DIAGNOSIS — G40909 Epilepsy, unspecified, not intractable, without status epilepticus: Secondary | ICD-10-CM | POA: Insufficient documentation
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
## 2015-06-07 NOTE — ED Notes (Addendum)
Pt c/o bil lower leg swelling with pain x's 3-4 days

## 2015-06-08 DIAGNOSIS — M792 Neuralgia and neuritis, unspecified: Secondary | ICD-10-CM | POA: Diagnosis not present

## 2015-06-08 MED ORDER — OXYCODONE-ACETAMINOPHEN 5-325 MG PO TABS
2.0000 | ORAL_TABLET | Freq: Once | ORAL | Status: AC
  Administered 2015-06-08: 2 via ORAL
  Filled 2015-06-08: qty 2

## 2015-06-08 MED ORDER — HYDROCODONE-ACETAMINOPHEN 5-325 MG PO TABS: 1.0000 | ORAL_TABLET | Freq: Four times a day (QID) | ORAL | Status: DC | PRN

## 2015-06-08 MED ORDER — IBUPROFEN 200 MG PO TABS
600.0000 mg | ORAL_TABLET | Freq: Once | ORAL | Status: AC
  Administered 2015-06-08: 600 mg via ORAL
  Filled 2015-06-08: qty 3

## 2015-06-08 NOTE — Discharge Instructions (Signed)
Neuropathic Pain Neuropathic pain is pain caused by damage to the nerves that are responsible for certain sensations in your body (sensory nerves). The pain can be caused by damage to:   The sensory nerves that send signals to your spinal cord and brain (peripheral nervous system).  The sensory nerves in your brain or spinal cord (central nervous system). Neuropathic pain can make you more sensitive to pain. What would be a minor sensation for most people may feel very painful if you have neuropathic pain. This is usually a long-term condition that can be difficult to treat. The type of pain can differ from person to person. It may start suddenly (acute), or it may develop slowly and last for a long time (chronic). Neuropathic pain may come and go as damaged nerves heal or may stay at the same level for years. It often causes emotional distress, loss of sleep, and a lower quality of life. CAUSES  The most common cause of damage to a sensory nerve is diabetes. Many other diseases and conditions can also cause neuropathic pain. Causes of neuropathic pain can be classified as:  Toxic. Many drugs and chemicals can cause toxic damage. The most common cause of toxic neuropathic pain is damage from drug treatment for cancer (chemotherapy).  Metabolic. This type of pain can happen when a disease causes imbalances that damage nerves. Diabetes is the most common of these diseases. Vitamin B deficiency caused by long-term alcohol abuse is another common cause.  Traumatic. Any injury that cuts, crushes, or stretches a nerve can cause damage and pain. A common example is feeling pain after losing an arm or leg (phantom limb pain).  Compression-related. If a sensory nerve gets trapped or compressed for a long period of time, the blood supply to the nerve can be cut off.  Vascular. Many blood vessel diseases can cause neuropathic pain by decreasing blood supply and oxygen to nerves.  Autoimmune. This type of  pain results from diseases in which the body's defense system mistakenly attacks sensory nerves. Examples of autoimmune diseases that can cause neuropathic pain include lupus and multiple sclerosis.  Infectious. Many types of viral infections can damage sensory nerves and cause pain. Shingles infection is a common cause of this type of pain.  Inherited. Neuropathic pain can be a symptom of many diseases that are passed down through families (genetic). SIGNS AND SYMPTOMS  The main symptom is pain. Neuropathic pain is often described as:  Burning.  Shock-like.  Stinging.  Hot or cold.  Itching. DIAGNOSIS  No single test can diagnose neuropathic pain. Your health care provider will do a physical exam and ask you about your pain. You may use a pain scale to describe how bad your pain is. You may also have tests to see if you have a high sensitivity to pain and to help find the cause and location of any sensory nerve damage. These tests may include:  Imaging studies, such as:  X-rays.  CT scan.  MRI.  Nerve conduction studies to test how well nerve signals travel through your sensory nerves (electrodiagnostic testing).  Stimulating your sensory nerves through electrodes on your skin and measuring the response in your spinal cord and brain (somatosensory evoked potentials). TREATMENT  Treatment for neuropathic pain may change over time. You may need to try different treatment options or a combination of treatments. Some options include:  Over-the-counter pain relievers.  Prescription medicines. Some medicines used to treat other conditions may also help neuropathic pain. These   include medicines to:  Control seizures (anticonvulsants).  Relieve depression (antidepressants).  Prescription-strength pain relievers (narcotics). These are usually used when other pain relievers do not help.  Transcutaneous nerve stimulation (TENS). This uses electrical currents to block painful nerve  signals. The treatment is painless.  Topical and local anesthetics. These are medicines that numb the nerves. They can be injected as a nerve block or applied to the skin.  Alternative treatments, such as:  Acupuncture.  Meditation.  Massage.  Physical therapy.  Pain management programs.  Counseling. HOME CARE INSTRUCTIONS  Learn as much as you can about your condition.  Take medicines only as directed by your health care provider.  Work closely with all your health care providers to find what works best for you.  Have a good support system at home.  Consider joining a chronic pain support group. SEEK MEDICAL CARE IF:  Your pain treatments are not helping.  You are having side effects from your medicines.  You are struggling with fatigue, mood changes, depression, or anxiety.   This information is not intended to replace advice given to you by your health care provider. Make sure you discuss any questions you have with your health care provider.   Document Released: 05/17/2004 Document Revised: 09/10/2014 Document Reviewed: 01/28/2014 Elsevier Interactive Patient Education 2016 [employer]  

## 2015-06-13 MED ORDER — GABAPENTIN 300 MG PO CAPS: 600.0000 mg | ORAL_CAPSULE | Freq: Three times a day (TID) | ORAL | Status: DC

## 2015-06-13 NOTE — Addendum Note (Signed)
Addended by: Gwendolyn Grant A on: 06/13/2015 09:10 AM   Modules accepted: Orders

## 2015-06-13 NOTE — Telephone Encounter (Signed)
Increase gaba dose to 600mg  TID - new erx done

## 2015-06-14 NOTE — Telephone Encounter (Signed)
Tried several times to contact pt and inform of the information below.  Closing note

## 2015-06-17 DIAGNOSIS — G890 Central pain syndrome: Secondary | ICD-10-CM | POA: Diagnosis not present

## 2015-06-17 DIAGNOSIS — M542 Cervicalgia: Secondary | ICD-10-CM | POA: Diagnosis not present

## 2015-06-17 DIAGNOSIS — M5441 Lumbago with sciatica, right side: Secondary | ICD-10-CM | POA: Diagnosis not present

## 2015-06-17 DIAGNOSIS — R202 Paresthesia of skin: Secondary | ICD-10-CM | POA: Diagnosis not present

## 2015-06-17 DIAGNOSIS — M545 Low back pain: Secondary | ICD-10-CM | POA: Diagnosis not present

## 2015-06-17 DIAGNOSIS — M546 Pain in thoracic spine: Secondary | ICD-10-CM | POA: Diagnosis not present

## 2015-07-01 DIAGNOSIS — M542 Cervicalgia: Secondary | ICD-10-CM | POA: Diagnosis not present

## 2015-07-01 DIAGNOSIS — G890 Central pain syndrome: Secondary | ICD-10-CM | POA: Diagnosis not present

## 2015-07-01 DIAGNOSIS — R202 Paresthesia of skin: Secondary | ICD-10-CM | POA: Diagnosis not present

## 2015-07-01 DIAGNOSIS — M545 Low back pain: Secondary | ICD-10-CM | POA: Diagnosis not present

## 2015-07-01 DIAGNOSIS — M5441 Lumbago with sciatica, right side: Secondary | ICD-10-CM | POA: Diagnosis not present

## 2015-07-03 ENCOUNTER — Other Ambulatory Visit: Payer: Self-pay | Admitting: Family

## 2015-07-06 ENCOUNTER — Other Ambulatory Visit: Payer: Self-pay | Admitting: Internal Medicine

## 2015-07-22 DIAGNOSIS — G890 Central pain syndrome: Secondary | ICD-10-CM | POA: Diagnosis not present

## 2015-07-22 DIAGNOSIS — M545 Low back pain: Secondary | ICD-10-CM | POA: Diagnosis not present

## 2015-07-22 DIAGNOSIS — R202 Paresthesia of skin: Secondary | ICD-10-CM | POA: Diagnosis not present

## 2015-07-22 DIAGNOSIS — M542 Cervicalgia: Secondary | ICD-10-CM | POA: Diagnosis not present

## 2015-07-29 ENCOUNTER — Other Ambulatory Visit: Payer: Self-pay | Admitting: Internal Medicine

## 2015-07-29 DIAGNOSIS — B20 Human immunodeficiency virus [HIV] disease: Secondary | ICD-10-CM

## 2015-07-31 ENCOUNTER — Other Ambulatory Visit: Payer: Self-pay | Admitting: Family

## 2015-08-02 ENCOUNTER — Other Ambulatory Visit: Payer: Self-pay | Admitting: Family

## 2015-08-03 ENCOUNTER — Other Ambulatory Visit: Payer: Self-pay | Admitting: Internal Medicine

## 2015-08-09 ENCOUNTER — Other Ambulatory Visit: Payer: Self-pay

## 2015-08-14 ENCOUNTER — Other Ambulatory Visit: Payer: Self-pay | Admitting: Internal Medicine

## 2015-08-14 ENCOUNTER — Other Ambulatory Visit: Payer: Self-pay | Admitting: Family

## 2015-08-18 ENCOUNTER — Other Ambulatory Visit: Payer: Self-pay | Admitting: Internal Medicine

## 2015-08-19 DIAGNOSIS — M545 Low back pain: Secondary | ICD-10-CM | POA: Diagnosis not present

## 2015-08-19 DIAGNOSIS — G8929 Other chronic pain: Secondary | ICD-10-CM | POA: Diagnosis not present

## 2015-08-19 DIAGNOSIS — G894 Chronic pain syndrome: Secondary | ICD-10-CM | POA: Diagnosis not present

## 2015-08-19 DIAGNOSIS — G890 Central pain syndrome: Secondary | ICD-10-CM | POA: Diagnosis not present

## 2015-08-23 ENCOUNTER — Ambulatory Visit: Payer: Self-pay | Admitting: Internal Medicine

## 2015-08-30 ENCOUNTER — Encounter: Payer: Self-pay | Admitting: Internal Medicine

## 2015-09-01 DIAGNOSIS — M79671 Pain in right foot: Secondary | ICD-10-CM | POA: Diagnosis not present

## 2015-09-01 DIAGNOSIS — G8929 Other chronic pain: Secondary | ICD-10-CM | POA: Diagnosis not present

## 2015-09-01 DIAGNOSIS — Z79891 Long term (current) use of opiate analgesic: Secondary | ICD-10-CM | POA: Diagnosis not present

## 2015-09-01 DIAGNOSIS — M79672 Pain in left foot: Secondary | ICD-10-CM | POA: Diagnosis not present

## 2015-09-01 DIAGNOSIS — M545 Low back pain: Secondary | ICD-10-CM | POA: Diagnosis not present

## 2015-09-20 ENCOUNTER — Encounter: Payer: Self-pay | Admitting: Endocrinology

## 2015-09-20 ENCOUNTER — Ambulatory Visit (INDEPENDENT_AMBULATORY_CARE_PROVIDER_SITE_OTHER): Payer: Medicare Other | Admitting: Endocrinology

## 2015-09-20 VITALS — BP 134/87 | HR 112 | Temp 98.4°F | Ht 63.0 in | Wt 247.0 lb

## 2015-09-20 DIAGNOSIS — E119 Type 2 diabetes mellitus without complications: Secondary | ICD-10-CM | POA: Diagnosis not present

## 2015-09-20 LAB — POCT GLYCOSYLATED HEMOGLOBIN (HGB A1C): HEMOGLOBIN A1C: 10.9

## 2015-09-20 MED ORDER — INSULIN ISOPHANE HUMAN 100 UNIT/ML KWIKPEN: 65.0000 [IU] | PEN_INJECTOR | SUBCUTANEOUS | Status: DC
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
## 2015-09-20 NOTE — Patient Instructions (Addendum)
Please change the insulin to NPH, 65 units each morning only.  It is very important to take none later in the day.  check your blood sugar twice a day.  vary the time of day when you check, between before the 3 meals, and at bedtime.  also check if you have symptoms of your blood sugar being too high or too low.  please keep a record of the readings and bring it to your next appointment here (or you can bring the meter itself).  You can write it on any piece of paper.  please call us sooner if your blood sugar goes below 70, or if you have a lot of readings over 200. Please come back for a follow-up appointment in 2 weeks.

## 2015-09-21 ENCOUNTER — Other Ambulatory Visit: Payer: Medicare Other

## 2015-09-21 ENCOUNTER — Telehealth: Payer: Self-pay | Admitting: *Deleted

## 2015-09-21 DIAGNOSIS — B20 Human immunodeficiency virus [HIV] disease: Secondary | ICD-10-CM

## 2015-09-21 NOTE — Telephone Encounter (Signed)
Patient came in for labs today and is requesting a temporary refill on her Seroquel. She said she takes 100 mg and 400 mg. She has an appointment at [facility], but it is not until 10/13/15. Dr. Linus Salmons previously refilled in May, stating she needed to establish with psychiatry. Please advise

## 2015-09-21 NOTE — Telephone Encounter (Signed)
Ok to refill for one month. thanks

## 2015-09-22 ENCOUNTER — Other Ambulatory Visit: Payer: Self-pay | Admitting: *Deleted

## 2015-09-22 LAB — T-HELPER CELL (CD4) - (RCID CLINIC ONLY)
CD4 % Helper T Cell: 23 % — ABNORMAL LOW (ref 33–55)
CD4 T CELL ABS: 590 /uL (ref 400–2700)

## 2015-09-22 MED ORDER — QUETIAPINE FUMARATE 100 MG PO TABS: 100.0000 mg | ORAL_TABLET | Freq: Every day | ORAL | Status: DC

## 2015-09-22 MED ORDER — QUETIAPINE FUMARATE 400 MG PO TABS: 400.0000 mg | ORAL_TABLET | Freq: Every day | ORAL | Status: DC

## 2015-09-22 NOTE — Telephone Encounter (Signed)
Rx sent and patient notified.

## 2015-09-23 LAB — HIV-1 RNA QUANT-NO REFLEX-BLD
HIV 1 RNA Quant: <20 copies/mL (ref <20)
HIV-1 RNA Quant, Log: <1.3 Log copies/mL (ref <1.30)

## 2015-10-02 ENCOUNTER — Other Ambulatory Visit: Payer: Self-pay | Admitting: Internal Medicine

## 2015-10-04 ENCOUNTER — Encounter: Payer: Self-pay | Admitting: Endocrinology

## 2015-10-04 ENCOUNTER — Ambulatory Visit (INDEPENDENT_AMBULATORY_CARE_PROVIDER_SITE_OTHER): Payer: Medicare Other | Admitting: Endocrinology

## 2015-10-04 VITALS — BP 143/96 | HR 102 | Temp 98.1°F | Ht 63.0 in | Wt 243.0 lb

## 2015-10-04 DIAGNOSIS — E1065 Type 1 diabetes mellitus with hyperglycemia: Secondary | ICD-10-CM | POA: Diagnosis not present

## 2015-10-04 NOTE — Patient Instructions (Addendum)
Please continue the same insulin.   blood tests are requested for you today.  We'll let you know about the results. On this type of insulin schedule, you should eat meals on a regular schedule.  If a meal is missed or significantly delayed, your blood sugar could go low.  check your blood sugar twice a day.  vary the time of day when you check, between before the 3 meals, and at bedtime.  also check if you have symptoms of your blood sugar being too high or too low.  please keep a record of the readings and bring it to your next appointment here (or you can bring the meter itself).  You can write it on any piece of paper.  please call us sooner if your blood sugar goes below 70, or if you have a lot of readings over 200. Please come back for a follow-up appointment in 3 months.
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
## 2015-10-05 ENCOUNTER — Ambulatory Visit: Payer: Medicare Other | Admitting: *Deleted

## 2015-10-05 ENCOUNTER — Encounter: Payer: Self-pay | Admitting: Infectious Diseases

## 2015-10-05 ENCOUNTER — Ambulatory Visit (INDEPENDENT_AMBULATORY_CARE_PROVIDER_SITE_OTHER): Payer: Medicare Other | Admitting: Infectious Diseases

## 2015-10-05 VITALS — BP 147/105 | HR 108 | Temp 98.3°F | Wt 244.0 lb

## 2015-10-05 DIAGNOSIS — Z23 Encounter for immunization: Secondary | ICD-10-CM | POA: Diagnosis not present

## 2015-10-05 DIAGNOSIS — F1910 Other psychoactive substance abuse, uncomplicated: Secondary | ICD-10-CM

## 2015-10-05 DIAGNOSIS — Z113 Encounter for screening for infections with a predominantly sexual mode of transmission: Secondary | ICD-10-CM | POA: Diagnosis not present

## 2015-10-05 DIAGNOSIS — B20 Human immunodeficiency virus [HIV] disease: Secondary | ICD-10-CM | POA: Diagnosis not present

## 2015-10-05 DIAGNOSIS — Z79899 Other long term (current) drug therapy: Secondary | ICD-10-CM | POA: Diagnosis not present

## 2015-10-05 MED ORDER — ELVITEG-COBIC-EMTRICIT-TENOFAF 150-150-200-10 MG PO TABS: 1.0000 | ORAL_TABLET | Freq: Every day | ORAL | Status: DC

## 2015-10-05 NOTE — Assessment & Plan Note (Signed)
She is doing better, esp with wt loss.  Will f/u with PCP Ask that PCP check her labs (RPR, GC/CHlamydia, Hep B S Ab) that have been entered, she wishes to avoid extra blood draw today

## 2015-10-05 NOTE — Assessment & Plan Note (Signed)
She is doing very well.  Given condoms and flu shot ART refilled.  Has PCP  F/u to get her remaining labs She has not been vax for Hep B, will recheck her S Ab, needs vax at f/u visit rtc in 6 months

## 2015-10-05 NOTE — BH Specialist Note (Signed)
Counselor met with Susan [name] in the exam room today per Dr. Johnnye Sima request.  Patient has been recently hospitalized for suicidal ideation.  Patient was oriented times four today with good affect and dress.  Patient was alert but preoccupied with her cell phone. Patient shared that she had been evicted from her home and that this incident sent her spiriting into a depression and hopelessness. Counselor inquired with patient if she was getting any counseling services and she indicated some at the place where she was receiving her medications.  Counselor educated patient about the counseling services offered here at [facility] and encouraged her to take advantage of it.  Counselor provided a business card for patient to refer to when needed.  Rolena Infante, MA [facility]

## 2015-10-05 NOTE — Progress Notes (Signed)
   Subjective:    Patient ID: Susan [name], female    DOB: Apr 01, 1961, 55 y.o.   MRN: [id number]  HPI 55 yo F with hx of HIV+, DM2 with neuropathy, HTN, hospitalization 05-2015 after SI/HI and attempted insulin overdose.  Has psychiatrist regular f/u now.  She was previously maintained on genvoya.  Has been doing well- has PCP f/u next week.Has had endo f/u also.  HIV 1 RNA QUANT (copies/mL)  Date Value  09/21/2015 <20  11/24/2014 <20  05/25/2014 <20   CD4 T CELL ABS (/uL)  Date Value  09/21/2015 590  11/24/2014 540  05/25/2014 600   Has lost 42# with nutrition help since Sept 2016.  FSG have improved with this as well. Last A1C was 10.9 (09-2015) Has ophtho appt on 10-10-15 [facility] set up by PCP  Review of Systems  Constitutional: Negative for appetite change and unexpected weight change.  Respiratory: Negative for cough and shortness of breath.   Cardiovascular: Negative for chest pain.  Gastrointestinal: Negative for diarrhea and constipation.  Genitourinary: Negative for difficulty urinating.  Neurological: Positive for numbness. Negative for headaches.       Objective:   Physical Exam  Constitutional: She appears well-developed and well-nourished.  HENT:  Mouth/Throat: No oropharyngeal exudate.  Eyes: EOM are normal. Pupils are equal, round, and reactive to light.  Neck: Neck supple.  Cardiovascular: Normal rate, regular rhythm and normal heart sounds.   Pulmonary/Chest: Effort normal and breath sounds normal.  Abdominal: Soft. Bowel sounds are normal. There is no tenderness. There is no rebound.  Musculoskeletal: She exhibits no edema.  No diabetic foot lesions, she has a small scratch on her dorsum of her L foot.  She has onychomycosis She has thickening of the soles/heels of both feet.   Lymphadenopathy:    She has no cervical adenopathy.      Assessment & Plan:

## 2015-10-06 LAB — FRUCTOSAMINE: FRUCTOSAMINE: 304 umol/L — AB (ref 190–270)

## 2015-10-07 ENCOUNTER — Telehealth: Payer: Self-pay | Admitting: Endocrinology

## 2015-10-11 ENCOUNTER — Ambulatory Visit (INDEPENDENT_AMBULATORY_CARE_PROVIDER_SITE_OTHER): Payer: Medicare Other | Admitting: Internal Medicine

## 2015-10-11 ENCOUNTER — Other Ambulatory Visit (INDEPENDENT_AMBULATORY_CARE_PROVIDER_SITE_OTHER): Payer: Medicare Other

## 2015-10-11 ENCOUNTER — Encounter: Payer: Self-pay | Admitting: Internal Medicine

## 2015-10-11 VITALS — BP 180/84 | HR 83 | Temp 98.8°F | Resp 18 | Ht 63.0 in | Wt 247.4 lb

## 2015-10-11 DIAGNOSIS — I10 Essential (primary) hypertension: Secondary | ICD-10-CM

## 2015-10-11 DIAGNOSIS — B20 Human immunodeficiency virus [HIV] disease: Secondary | ICD-10-CM

## 2015-10-11 DIAGNOSIS — E118 Type 2 diabetes mellitus with unspecified complications: Secondary | ICD-10-CM

## 2015-10-11 DIAGNOSIS — F17200 Nicotine dependence, unspecified, uncomplicated: Secondary | ICD-10-CM

## 2015-10-11 DIAGNOSIS — Z794 Long term (current) use of insulin: Secondary | ICD-10-CM

## 2015-10-11 DIAGNOSIS — N951 Menopausal and female climacteric states: Secondary | ICD-10-CM

## 2015-10-11 DIAGNOSIS — F333 Major depressive disorder, recurrent, severe with psychotic symptoms: Secondary | ICD-10-CM | POA: Diagnosis not present

## 2015-10-11 DIAGNOSIS — R232 Flushing: Secondary | ICD-10-CM

## 2015-10-11 DIAGNOSIS — G609 Hereditary and idiopathic neuropathy, unspecified: Secondary | ICD-10-CM

## 2015-10-11 LAB — COMPREHENSIVE METABOLIC PANEL
ALK PHOS: 104 U/L (ref 39–117)
ALT: 11 U/L (ref 0–35)
AST: 11 U/L (ref 0–37)
Albumin: 3.6 g/dL (ref 3.5–5.2)
BUN: 11 mg/dL (ref 6–23)
CHLORIDE: 103 meq/L (ref 96–112)
CO2: 29 meq/L (ref 19–32)
Calcium: 9.4 mg/dL (ref 8.4–10.5)
Creatinine, Ser: 0.7 mg/dL (ref 0.40–1.20)
GFR: 112.41 mL/min (ref >60.00)
GLUCOSE: 123 mg/dL — AB (ref 70–99)
POTASSIUM: 3.5 meq/L (ref 3.5–5.1)
Sodium: 139 mEq/L (ref 135–145)
TOTAL PROTEIN: 6.9 g/dL (ref 6.0–8.3)
Total Bilirubin: 0.3 mg/dL (ref 0.2–1.2)

## 2015-10-11 LAB — LIPID PANEL
CHOL/HDL RATIO: 5
Cholesterol: 228 mg/dL — ABNORMAL HIGH (ref 0–200)
HDL: 41.5 mg/dL (ref >39.00)
LDL Cholesterol: 147 mg/dL — ABNORMAL HIGH (ref 0–99)
NONHDL: 186.29
Triglycerides: 195 mg/dL — ABNORMAL HIGH (ref 0.0–149.0)
VLDL: 39 mg/dL (ref 0.0–40.0)

## 2015-10-11 LAB — TSH: TSH: 0.8 u[IU]/mL (ref 0.35–4.50)

## 2015-10-11 LAB — CBC
HCT: 44.5 % (ref 36.0–46.0)
HEMOGLOBIN: 14.9 g/dL (ref 12.0–15.0)
MCHC: 33.6 g/dL (ref 30.0–36.0)
MCV: 90.1 fl (ref 78.0–100.0)
PLATELETS: 289 10*3/uL (ref 150.0–400.0)
RBC: 4.93 Mil/uL (ref 3.87–5.11)
RDW: 13.4 % (ref 11.5–15.5)
WBC: 12.1 10*3/uL — ABNORMAL HIGH (ref 4.0–10.5)

## 2015-10-11 MED ORDER — AMLODIPINE BESYLATE 10 MG PO TABS: 10.0000 mg | ORAL_TABLET | Freq: Every day | ORAL | Status: DC

## 2015-10-11 MED ORDER — ENALAPRIL MALEATE 20 MG PO TABS: 20.0000 mg | ORAL_TABLET | Freq: Every day | ORAL | Status: DC

## 2015-10-11 MED ORDER — VENLAFAXINE HCL ER 225 MG PO TB24: 225.0000 mg | ORAL_TABLET | Freq: Every day | ORAL | Status: DC

## 2015-10-11 MED ORDER — QUETIAPINE FUMARATE 100 MG PO TABS: 100.0000 mg | ORAL_TABLET | Freq: Every day | ORAL | Status: DC

## 2015-10-11 MED ORDER — CLONIDINE HCL 0.2 MG PO TABS: 0.2000 mg | ORAL_TABLET | Freq: Two times a day (BID) | ORAL | Status: DC

## 2015-10-11 MED ORDER — GABAPENTIN 300 MG PO CAPS: 600.0000 mg | ORAL_CAPSULE | Freq: Three times a day (TID) | ORAL | Status: DC

## 2015-10-11 MED ORDER — QUETIAPINE FUMARATE 400 MG PO TABS: 400.0000 mg | ORAL_TABLET | Freq: Every day | ORAL | Status: DC

## 2015-10-11 NOTE — Patient Instructions (Signed)
We have slightly increased the effexor dosing and sent it and the seroquel into the pharmacy.   We have sent in the amlodipine and the other blood pressure medicines into the pharmacy.   I would like to see you back in about 1-2 months to see how you are doing on the medicine so we can decide if you are on the right stuff or if you need changes.

## 2015-10-11 NOTE — Progress Notes (Signed)
Pre visit review using our clinic review tool, if applicable. No additional management support is needed unless otherwise documented below in the visit note. 

## 2015-10-12 LAB — T-HELPER CELLS (CD4) COUNT (NOT AT ARMC)
Absolute CD4: 903 cells/uL (ref 381–1469)
CD4 T HELPER %: 26 % — AB (ref 32–62)
Total lymphocyte count: 3428 cells/uL — ABNORMAL HIGH (ref 700–3300)

## 2015-10-12 LAB — RPR

## 2015-10-12 LAB — HEPATITIS B SURFACE ANTIBODY,QUALITATIVE: HEP B S AB: NEGATIVE

## 2015-10-12 NOTE — Telephone Encounter (Signed)
ERROR

## 2015-10-13 LAB — HIV-1 RNA QUANT-NO REFLEX-BLD: HIV 1 RNA Quant: <20 copies/mL (ref <20)

## 2015-10-13 LAB — GC/CHLAMYDIA PROBE AMP

## 2015-10-16 DIAGNOSIS — R232 Flushing: Secondary | ICD-10-CM | POA: Insufficient documentation

## 2015-10-16 NOTE — Assessment & Plan Note (Signed)
She is high today due to being out of 2 medications. Previously well controlled. Refill enalapril and amlodipine and clonidine. No symptoms and checking CMP today for complications.

## 2015-10-16 NOTE — Assessment & Plan Note (Signed)
Checking HIV viral load, CD4 count, and STD screening for ID. Will forward results to them. She denies missing doses of her genvoya.

## 2015-10-16 NOTE — Assessment & Plan Note (Signed)
Increase her effexor to help with her hot flashes and her pain.

## 2015-10-16 NOTE — Assessment & Plan Note (Signed)
Rx for her seroquel until she can get to her mental health visit next week and she is stable at this time.

## 2015-10-16 NOTE — Assessment & Plan Note (Signed)
She is maxed out of gabapentin and cannot increase. Can increase her effexor which may help slightly.

## 2015-10-16 NOTE — Assessment & Plan Note (Signed)
Counseled her to quit and she does not wish to do that now.

## 2015-10-16 NOTE — Progress Notes (Signed)
   Subjective:    Patient ID: Susan [name], female    DOB: 1961-08-26, 55 y.o.   MRN: [id number]  HPI The patient is a 55 YO female with complicated PMH coming in for several acute concerns. Her hot flashes are worse lately (she is taking venlafaxine 150 mg daily and this is not helping much, she is in menopause, keep her from sleeping), and she is having more pain from her diabetes (she does have gabapentin for this but it is not helping much anymore, sugars are fairly well controlled right now per her), and her mental illness (she has a visit with mental health to establish next week but will run out of medication, she does well with this regimen, does not want to relapse off her seroquel). No other complaints. Denies chest pains or headaches. Has not taken her medications yet today as she is out of some of them. Especially her enalapril and amlodipine although she is taking her clonidine she thinks. She is due labs from her HIV doctor and he has requested that we draw to avoid duplicate draws.   Review of Systems  Constitutional: Negative for fever, chills, activity change, appetite change, fatigue and unexpected weight change.  HENT: Negative.   Eyes: Negative.   Respiratory: Negative.   Cardiovascular: Negative for chest pain, palpitations and leg swelling.  Gastrointestinal: Negative for abdominal pain, diarrhea, constipation and abdominal distention.  Musculoskeletal: Positive for myalgias and arthralgias. Negative for back pain and gait problem.  Skin: Negative.   Neurological: Positive for numbness. Negative for dizziness, weakness, light-headedness and headaches.      Objective:   Physical Exam  Constitutional: She is oriented to person, place, and time. She appears well-developed and well-nourished.  Overweight  HENT:  Head: Normocephalic and atraumatic.  Eyes: EOM are normal.  Neck: Normal range of motion.  Cardiovascular: Normal rate and regular rhythm.   Pulmonary/Chest:  Effort normal and breath sounds normal. No respiratory distress. She has no wheezes.  Abdominal: Soft. Bowel sounds are normal. She exhibits no distension. There is no tenderness. There is no rebound.  Musculoskeletal: She exhibits no edema.  Neurological: She is alert and oriented to person, place, and time.  Skin: Skin is warm and dry.  Psychiatric: She has a normal mood and affect.   Filed Vitals:   10/11/15 1450 10/11/15 1521  BP: 180/90 180/84  Pulse: 83   Temp: 98.8 F (37.1 C)   TempSrc: Oral   Resp: 18   Height: 5\' 3"  (1.6 m)   Weight: 247 lb 6.4 oz (112.22 kg)   SpO2: 99%       Assessment & Plan:

## 2015-11-11 ENCOUNTER — Other Ambulatory Visit: Payer: Self-pay | Admitting: Internal Medicine

## 2015-11-14 ENCOUNTER — Telehealth: Payer: Self-pay

## 2015-11-14 NOTE — Telephone Encounter (Signed)
PA initiated via CoverMyMeds key [id number]

## 2015-11-15 NOTE — Telephone Encounter (Signed)
PA Approved thru 09/02/2016

## 2015-12-08 ENCOUNTER — Encounter: Payer: Self-pay | Admitting: Internal Medicine

## 2015-12-09 ENCOUNTER — Other Ambulatory Visit: Payer: Self-pay | Admitting: Internal Medicine

## 2016-01-02 ENCOUNTER — Ambulatory Visit: Payer: Self-pay | Admitting: Endocrinology

## 2016-01-02 DIAGNOSIS — Z0289 Encounter for other administrative examinations: Secondary | ICD-10-CM

## 2016-01-03 ENCOUNTER — Telehealth: Payer: Self-pay | Admitting: Endocrinology

## 2016-01-03 NOTE — Telephone Encounter (Signed)
[name],  Could you please contact the pt to reschedule. Thanks!  

## 2016-01-03 NOTE — Telephone Encounter (Signed)
Patient no showed today's appt. Please advise on how to follow up. °A. No follow up necessary. °B. Follow up urgent. Contact patient immediately. °C. Follow up necessary. Contact patient and schedule visit in ___ days. °D. Follow up advised. Contact patient and schedule visit in ____weeks. ° °

## 2016-01-03 NOTE — Telephone Encounter (Signed)
Called pt to r/s no answer, no VM set up

## 2016-01-03 NOTE — Telephone Encounter (Signed)
Please come back for a follow-up appointment in 2 weeks 

## 2016-01-11 ENCOUNTER — Ambulatory Visit: Payer: Self-pay | Admitting: Internal Medicine

## 2016-01-13 ENCOUNTER — Other Ambulatory Visit: Payer: Self-pay | Admitting: Internal Medicine

## 2016-01-15 ENCOUNTER — Encounter (HOSPITAL_COMMUNITY): Payer: Self-pay | Admitting: Emergency Medicine

## 2016-01-15 ENCOUNTER — Emergency Department (HOSPITAL_COMMUNITY)
Admission: EM | Admit: 2016-01-15 | Discharge: 2016-01-17 | Disposition: A | Discharge status: Home / Self Care | Payer: Medicare Other | Attending: Emergency Medicine | Admitting: Emergency Medicine

## 2016-01-15 DIAGNOSIS — J45909 Unspecified asthma, uncomplicated: Secondary | ICD-10-CM | POA: Insufficient documentation

## 2016-01-15 DIAGNOSIS — F329 Major depressive disorder, single episode, unspecified: Secondary | ICD-10-CM | POA: Diagnosis not present

## 2016-01-15 DIAGNOSIS — Z79891 Long term (current) use of opiate analgesic: Secondary | ICD-10-CM | POA: Diagnosis not present

## 2016-01-15 DIAGNOSIS — K219 Gastro-esophageal reflux disease without esophagitis: Secondary | ICD-10-CM | POA: Diagnosis not present

## 2016-01-15 DIAGNOSIS — F332 Major depressive disorder, recurrent severe without psychotic features: Secondary | ICD-10-CM | POA: Diagnosis not present

## 2016-01-15 DIAGNOSIS — I10 Essential (primary) hypertension: Secondary | ICD-10-CM | POA: Diagnosis not present

## 2016-01-15 DIAGNOSIS — Z794 Long term (current) use of insulin: Secondary | ICD-10-CM | POA: Diagnosis not present

## 2016-01-15 DIAGNOSIS — E119 Type 2 diabetes mellitus without complications: Secondary | ICD-10-CM | POA: Diagnosis not present

## 2016-01-15 DIAGNOSIS — E7800 Pure hypercholesterolemia, unspecified: Secondary | ICD-10-CM | POA: Insufficient documentation

## 2016-01-15 DIAGNOSIS — M170 Bilateral primary osteoarthritis of knee: Secondary | ICD-10-CM | POA: Insufficient documentation

## 2016-01-15 DIAGNOSIS — F1020 Alcohol dependence, uncomplicated: Secondary | ICD-10-CM | POA: Diagnosis not present

## 2016-01-15 DIAGNOSIS — R45850 Homicidal ideations: Secondary | ICD-10-CM | POA: Diagnosis not present

## 2016-01-15 DIAGNOSIS — F339 Major depressive disorder, recurrent, unspecified: Secondary | ICD-10-CM | POA: Diagnosis present

## 2016-01-15 DIAGNOSIS — F4310 Post-traumatic stress disorder, unspecified: Secondary | ICD-10-CM | POA: Diagnosis present

## 2016-01-15 DIAGNOSIS — Z8673 Personal history of transient ischemic attack (TIA), and cerebral infarction without residual deficits: Secondary | ICD-10-CM | POA: Diagnosis not present

## 2016-01-15 DIAGNOSIS — F17210 Nicotine dependence, cigarettes, uncomplicated: Secondary | ICD-10-CM | POA: Insufficient documentation

## 2016-01-15 DIAGNOSIS — Z79899 Other long term (current) drug therapy: Secondary | ICD-10-CM | POA: Diagnosis not present

## 2016-01-15 DIAGNOSIS — R45851 Suicidal ideations: Secondary | ICD-10-CM | POA: Diagnosis not present

## 2016-01-15 DIAGNOSIS — F32A Depression, unspecified: Secondary | ICD-10-CM

## 2016-01-15 LAB — ETHANOL: ALCOHOL ETHYL (B): 122 mg/dL — AB (ref <5)

## 2016-01-15 LAB — RAPID URINE DRUG SCREEN, HOSP PERFORMED
Amphetamines: NOT DETECTED
BENZODIAZEPINES: NOT DETECTED
Barbiturates: POSITIVE — AB
COCAINE: NOT DETECTED
Opiates: NOT DETECTED
Tetrahydrocannabinol: POSITIVE — AB

## 2016-01-15 LAB — CBC WITH DIFFERENTIAL/PLATELET
Basophils Absolute: 0 K/uL (ref 0.0–0.1)
Basophils Relative: 0 %
Eosinophils Absolute: 0.2 K/uL (ref 0.0–0.7)
Eosinophils Relative: 4 %
HCT: 45.1 % (ref 36.0–46.0)
Hemoglobin: 16.3 g/dL — ABNORMAL HIGH (ref 12.0–15.0)
Lymphocytes Relative: 39 %
Lymphs Abs: 2.6 K/uL (ref 0.7–4.0)
MCH: 32.6 pg (ref 26.0–34.0)
MCHC: 36.1 g/dL — ABNORMAL HIGH (ref 30.0–36.0)
MCV: 90.2 fL (ref 78.0–100.0)
Monocytes Absolute: 0.3 K/uL (ref 0.1–1.0)
Monocytes Relative: 4 %
Neutro Abs: 3.6 K/uL (ref 1.7–7.7)
Neutrophils Relative %: 53 %
Platelets: 222 K/uL (ref 150–400)
RBC: 5 MIL/uL (ref 3.87–5.11)
RDW: 13.4 % (ref 11.5–15.5)
WBC: 6.8 K/uL (ref 4.0–10.5)

## 2016-01-15 LAB — CBG MONITORING, ED: GLUCOSE-CAPILLARY: 434 mg/dL — AB (ref 65–99)

## 2016-01-15 LAB — COMPREHENSIVE METABOLIC PANEL
ALT: 14 U/L (ref 14–54)
AST: 14 U/L — AB (ref 15–41)
Albumin: 4 g/dL (ref 3.5–5.0)
Alkaline Phosphatase: 129 U/L — ABNORMAL HIGH (ref 38–126)
Anion gap: 16 — ABNORMAL HIGH (ref 5–15)
BILIRUBIN TOTAL: 0.5 mg/dL (ref 0.3–1.2)
BUN: 7 mg/dL (ref 6–20)
CO2: 21 mmol/L — ABNORMAL LOW (ref 22–32)
CREATININE: 0.72 mg/dL (ref 0.44–1.00)
Calcium: 9 mg/dL (ref 8.9–10.3)
Chloride: 102 mmol/L (ref 101–111)
GFR calc Af Amer: >60 mL/min (ref >60)
Glucose, Bld: 458 mg/dL — ABNORMAL HIGH (ref 65–99)
Potassium: 3.6 mmol/L (ref 3.5–5.1)
Sodium: 139 mmol/L (ref 135–145)
TOTAL PROTEIN: 7.3 g/dL (ref 6.5–8.1)

## 2016-01-15 MED ORDER — INSULIN NPH (HUMAN) (ISOPHANE) 100 UNIT/ML ~~LOC~~ SUSP
65.0000 [IU] | Freq: Every day | SUBCUTANEOUS | Status: DC
  Administered 2016-01-16: 65 [IU] via SUBCUTANEOUS
  Filled 2016-01-15 (×2): qty 10

## 2016-01-15 MED ORDER — GABAPENTIN 300 MG PO CAPS
600.0000 mg | ORAL_CAPSULE | Freq: Three times a day (TID) | ORAL | Status: DC
  Administered 2016-01-15 – 2016-01-17 (×5): 600 mg via ORAL
  Filled 2016-01-15 (×5): qty 2

## 2016-01-15 MED ORDER — ALUM & MAG HYDROXIDE-SIMETH 200-200-20 MG/5ML PO SUSP: 30.0000 mL | ORAL | Status: DC | PRN

## 2016-01-15 MED ORDER — ENALAPRIL MALEATE 20 MG PO TABS
20.0000 mg | ORAL_TABLET | Freq: Every day | ORAL | Status: DC
  Administered 2016-01-15 – 2016-01-17 (×3): 20 mg via ORAL
  Filled 2016-01-15 (×3): qty 1

## 2016-01-15 MED ORDER — SODIUM CHLORIDE 0.9 % IV BOLUS (SEPSIS)
2000.0000 mL | Freq: Once | INTRAVENOUS | Status: AC
  Administered 2016-01-15: 2000 mL via INTRAVENOUS

## 2016-01-15 MED ORDER — AMLODIPINE BESYLATE 10 MG PO TABS
10.0000 mg | ORAL_TABLET | Freq: Every day | ORAL | Status: DC
  Administered 2016-01-15 – 2016-01-17 (×3): 10 mg via ORAL
  Filled 2016-01-15 (×3): qty 1

## 2016-01-15 MED ORDER — ONDANSETRON HCL 4 MG PO TABS: 4.0000 mg | ORAL_TABLET | Freq: Three times a day (TID) | ORAL | Status: DC | PRN

## 2016-01-15 MED ORDER — ACETAMINOPHEN 325 MG PO TABS: 650.0000 mg | ORAL_TABLET | ORAL | Status: DC | PRN

## 2016-01-15 MED ORDER — INSULIN ASPART 100 UNIT/ML ~~LOC~~ SOLN
10.0000 [IU] | Freq: Once | SUBCUTANEOUS | Status: AC
  Administered 2016-01-15: 10 [IU] via SUBCUTANEOUS
  Filled 2016-01-15: qty 1

## 2016-01-15 MED ORDER — ELVITEG-COBIC-EMTRICIT-TENOFAF 150-150-200-10 MG PO TABS
1.0000 | ORAL_TABLET | Freq: Every day | ORAL | Status: DC
Start: 2016-01-15 — End: 2016-01-17
  Administered 2016-01-15 – 2016-01-17 (×3): 1 via ORAL
  Filled 2016-01-15 (×4): qty 1

## 2016-01-15 MED ORDER — ZOLPIDEM TARTRATE 5 MG PO TABS
5.0000 mg | ORAL_TABLET | Freq: Every evening | ORAL | Status: DC | PRN
  Administered 2016-01-16 (×2): 5 mg via ORAL
  Filled 2016-01-15 (×2): qty 1

## 2016-01-15 MED ORDER — CLONIDINE HCL 0.1 MG PO TABS
0.2000 mg | ORAL_TABLET | Freq: Two times a day (BID) | ORAL | Status: DC
  Administered 2016-01-15 – 2016-01-17 (×4): 0.2 mg via ORAL
  Filled 2016-01-15 (×4): qty 2

## 2016-01-15 NOTE — ED Notes (Signed)
Provider notified of late insulin dose/  cbg 434  10 units given  2000 bolus going  Will recheck in 1 hour per provider
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
## 2016-01-15 NOTE — ED Provider Notes (Signed)
Si/hi HIV + IDDM Awaiting cmet and tts  10:01 PM Patient's glucose is elevated,. She will need fluids and her medications.  Patient with continued elevated blood sugar.  Given 10 of sub q insulin  X 2 Will with hold further until morning meds given . She appears safe for psych eval.   Margarita Mail, PA-C 01/19/16 [id number]  Noemi Chapel, MD 01/19/16 [phone]

## 2016-01-16 DIAGNOSIS — R45850 Homicidal ideations: Secondary | ICD-10-CM

## 2016-01-16 DIAGNOSIS — F1020 Alcohol dependence, uncomplicated: Secondary | ICD-10-CM | POA: Diagnosis present

## 2016-01-16 DIAGNOSIS — F329 Major depressive disorder, single episode, unspecified: Secondary | ICD-10-CM | POA: Diagnosis not present

## 2016-01-16 DIAGNOSIS — R45851 Suicidal ideations: Secondary | ICD-10-CM

## 2016-01-16 DIAGNOSIS — F332 Major depressive disorder, recurrent severe without psychotic features: Secondary | ICD-10-CM | POA: Diagnosis not present

## 2016-01-16 LAB — BASIC METABOLIC PANEL
Anion gap: 7 (ref 5–15)
BUN: 9 mg/dL (ref 6–20)
CO2: 24 mmol/L (ref 22–32)
CREATININE: 0.66 mg/dL (ref 0.44–1.00)
Calcium: 8.4 mg/dL — ABNORMAL LOW (ref 8.9–10.3)
Chloride: 106 mmol/L (ref 101–111)
GFR calc Af Amer: >60 mL/min (ref >60)
GLUCOSE: 264 mg/dL — AB (ref 65–99)
POTASSIUM: 3.5 mmol/L (ref 3.5–5.1)
SODIUM: 137 mmol/L (ref 135–145)

## 2016-01-16 LAB — CBG MONITORING, ED
GLUCOSE-CAPILLARY: 352 mg/dL — AB (ref 65–99)
GLUCOSE-CAPILLARY: 337 mg/dL — AB (ref 65–99)
GLUCOSE-CAPILLARY: 373 mg/dL — AB (ref 65–99)
Glucose-Capillary: 406 mg/dL — ABNORMAL HIGH (ref 65–99)
Glucose-Capillary: 256 mg/dL — ABNORMAL HIGH (ref 65–99)
Glucose-Capillary: 42 mg/dL — CL (ref 65–99)
Glucose-Capillary: 75 mg/dL (ref 65–99)
Glucose-Capillary: 152 mg/dL — ABNORMAL HIGH (ref 65–99)

## 2016-01-16 MED ORDER — QUETIAPINE FUMARATE 100 MG PO TABS
100.0000 mg | ORAL_TABLET | Freq: Every day | ORAL | Status: DC
  Filled 2016-01-16: qty 1

## 2016-01-16 MED ORDER — INSULIN ASPART 100 UNIT/ML ~~LOC~~ SOLN
10.0000 [IU] | Freq: Once | SUBCUTANEOUS | Status: AC
  Administered 2016-01-16: 10 [IU] via SUBCUTANEOUS
  Filled 2016-01-16: qty 1

## 2016-01-16 MED ORDER — ATORVASTATIN CALCIUM 20 MG PO TABS
20.0000 mg | ORAL_TABLET | Freq: Every day | ORAL | Status: DC
  Administered 2016-01-16: 20 mg via ORAL
  Filled 2016-01-16 (×2): qty 1

## 2016-01-16 MED ORDER — FLUOXETINE HCL 10 MG PO CAPS
10.0000 mg | ORAL_CAPSULE | Freq: Every day | ORAL | Status: DC
  Administered 2016-01-17: 10 mg via ORAL
  Filled 2016-01-16 (×2): qty 1

## 2016-01-16 MED ORDER — INSULIN NPH (HUMAN) (ISOPHANE) 100 UNIT/ML ~~LOC~~ SUSP
45.0000 [IU] | Freq: Every day | SUBCUTANEOUS | Status: DC
  Administered 2016-01-17: 45 [IU] via SUBCUTANEOUS
  Filled 2016-01-16: qty 10

## 2016-01-16 MED ORDER — IBUPROFEN 200 MG PO TABS
400.0000 mg | ORAL_TABLET | Freq: Four times a day (QID) | ORAL | Status: DC | PRN
  Administered 2016-01-16: 400 mg via ORAL
  Filled 2016-01-16: qty 2

## 2016-01-16 MED ORDER — SIMVASTATIN 40 MG PO TABS: 40.0000 mg | ORAL_TABLET | Freq: Every day | ORAL | Status: DC

## 2016-01-16 MED ORDER — PANTOPRAZOLE SODIUM 40 MG PO TBEC
40.0000 mg | DELAYED_RELEASE_TABLET | Freq: Every day | ORAL | Status: DC
  Administered 2016-01-16 – 2016-01-17 (×2): 40 mg via ORAL
  Filled 2016-01-16 (×2): qty 1

## 2016-01-16 MED ORDER — QUETIAPINE FUMARATE 300 MG PO TABS
400.0000 mg | ORAL_TABLET | Freq: Every day | ORAL | Status: DC
  Administered 2016-01-16: 400 mg via ORAL
  Filled 2016-01-16: qty 1

## 2016-01-16 MED ORDER — INSULIN ASPART 100 UNIT/ML ~~LOC~~ SOLN
0.0000 [IU] | Freq: Three times a day (TID) | SUBCUTANEOUS | Status: DC
  Administered 2016-01-16: 20 [IU] via SUBCUTANEOUS
  Administered 2016-01-17: 7 [IU] via SUBCUTANEOUS
  Administered 2016-01-17: 11 [IU] via SUBCUTANEOUS
  Filled 2016-01-16: qty 1

## 2016-01-16 NOTE — BH Assessment (Signed)
Faxed clinical information to the following facilities:  [facility]   [address] [place], [place], [facility], [facility] Triage Specialist [phone]

## 2016-01-16 NOTE — ED Notes (Signed)
Bed: [facility] Expected date:  Expected time:  Means of arrival:  Comments: [place] Crane C

## 2016-01-16 NOTE — ED Notes (Signed)
PER TOM/ACT- NO BEDS AVAILABLE AT THIS TIME

## 2016-01-16 NOTE — ED Notes (Signed)
Pharmacy contacted on insulin. Estill Bamberg tech assisted
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
## 2016-01-16 NOTE — ED Notes (Signed)
CBG 42. Pt alert in room. Gave orange juice and will recheck.
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
## 2016-01-16 NOTE — Progress Notes (Signed)
CSW was notified by nurse that pt would like to speak with CSW.  CSW met with patient at bedside. Patient was tearful. Patient informed CSW that her daughter would possibly be able to bring her medications in the morning. However, patient was tearful. Patient states that she would prefer to go to [facility].  CSW informed patient that it is not guaranteed that she will be accepted into a certain facility and staff will inform her which facilty offers her a bed.  Willette Brace [phone] ED CSW 01/16/2016 10:16 PM

## 2016-01-16 NOTE — BH Assessment (Signed)
[place] Assessment Progress Note  The following facilities have been contacted to seek placement for this pt, with results as noted:  Beds available, information sent, decision pending:  [facility]   At capacity:  [facility]   Jalene Mullet, [place] Triage Specialist [phone]

## 2016-01-16 NOTE — ED Notes (Signed)
Social Worker [place] at bedside to speak with pt.

## 2016-01-16 NOTE — ED Notes (Signed)
DAWNALY RN SAPPU MADE AWARE OF PENDING AM MEDS NOT GIVEN, SLIDING SCALE ORDER (EDP NGUYEN AWARE AND WILL ORDER), PT IS VOLUNTARY.

## 2016-01-16 NOTE — Progress Notes (Signed)
CSW spoke with CiCi of [facility] who request labs be sent for review. CiCi also inquired if patient would be able to come to facility with HIV medications. CSW consulted with NP who states that the patient will not get HIV medication scripts from [facility]. CSW reached out to [facility] to inform her. However, she did not answer the phone.  CSW spoke with nurse who states that the patient does not have any HIV medications listed in her belonging chart.  CSW faxed the requested labs to [facility] in order to try to obtain possible placement.  Willette Brace [phone] ED CSW 01/16/2016 7:03 PM

## 2016-01-16 NOTE — ED Notes (Addendum)
SPOKE WITH JESSICA FROM PHARM WHO STATES SHE WILL SEND SECOND INSULIN VIAL FOR ADMINISTRATION. CHECKED SAPPU TUBE STATION WITHOUT SUCCESS.

## 2016-01-16 NOTE — ED Notes (Signed)
EDP notified of [place]

## 2016-01-16 NOTE — ED Provider Notes (Signed)
6:50 PM I was called this patient's blood sugar dropped to 42. She was given juice and her CBG was rechecked at 75. She is reportedly asymptomatic. I changed order for insulin NPH t,, decreased insulin from 65 units to 45 units each day at [place], MD 01/16/16 [id number]

## 2016-01-16 NOTE — ED Notes (Signed)
Rechecked cbg 75. Pt is alert sitting in her room waiting on dinner. Safety maintained.

## 2016-01-16 NOTE — Progress Notes (Signed)
CSW spoke with [name] of [facility] states that their medication does not provide the medications that pt would need for HIV. She states the inquire if the patient would be able to to get her medications from home.   CSW met with patient at bedside. Patient states that it is possible that her daughter will be able to bring her medications to [facility]. She states that she will call daughter and let nurse or CSW know if daughter would be able to bring medication. Also, she states that if the patient has 2 more reading under 200 for glucose and is able to get HIV medications the patient will be able to get a bed if they have an opening. She states that if the facility does not have an open bed she will be put on the wait list.  Patient has no other questions for CSW at this time.  Willette Brace [phone] ED CSW 01/16/2016 8:52 PM

## 2016-01-16 NOTE — ED Notes (Signed)
Pt AAO x 3, no distress noted,calm & cooperative, monitoring for safety, Q 15 min checks in effect.  Pending repeat CBG.

## 2016-01-17 DIAGNOSIS — F329 Major depressive disorder, single episode, unspecified: Secondary | ICD-10-CM | POA: Diagnosis not present

## 2016-01-17 LAB — CBG MONITORING, ED
GLUCOSE-CAPILLARY: 294 mg/dL — AB (ref 65–99)
Glucose-Capillary: 216 mg/dL — ABNORMAL HIGH (ref 65–99)

## 2016-01-17 MED ORDER — FLUOXETINE HCL 20 MG PO CAPS
20.0000 mg | ORAL_CAPSULE | Freq: Every day | ORAL | Status: DC
Start: 2016-01-18 — End: 2016-01-17

## 2016-01-17 NOTE — BH Assessment (Signed)
[place] Assessment Progress Note  Per Corena Pilgrim, MD, this pt requires psychiatric hospitalization at this time.  At 12:06 Seth Bake calls from [facility].  Pt has been accepted to their facility by Dr Dareen Piano to the [facility].  Charmaine Downs, NP, concurs with this decision.  Pt's nurse, Nicoletta Dress, has been notified, and agrees to call report to [phone].  Pt is to be transported via Stacey Drain, [place] Triage Specialist [phone]

## 2016-01-18 ENCOUNTER — Telehealth: Payer: Self-pay | Admitting: Internal Medicine

## 2016-01-18 NOTE — Telephone Encounter (Signed)
Patient was scheduled with [facility] on 5/10.  Appointment was not cancelled from schedule.  Can you please make sure no show fee does not get asset.

## 2016-01-19 NOTE — Telephone Encounter (Signed)
Corrected EOD status in EPIC to "cancelled by provider". Notified Ebony/HP not to charge this patient a NS fee for [facility] 01/11/16.

## 2016-01-31 ENCOUNTER — Other Ambulatory Visit: Payer: Self-pay | Admitting: Internal Medicine

## 2016-03-05 ENCOUNTER — Other Ambulatory Visit: Payer: Self-pay | Admitting: Endocrinology

## 2016-03-12 ENCOUNTER — Other Ambulatory Visit: Payer: Self-pay | Admitting: Internal Medicine

## 2016-03-16 ENCOUNTER — Telehealth: Payer: Self-pay | Admitting: Internal Medicine

## 2016-03-16 MED ORDER — FLUCONAZOLE 150 MG PO TABS: 150.0000 mg | ORAL_TABLET | Freq: Once | ORAL | Status: DC

## 2016-03-16 NOTE — Telephone Encounter (Signed)
Patient states she has a yeast infection.  She is requesting medication for this.  She uses [facility] on [address].  Patient is also requesting tramadol for pain in her leg.

## 2016-03-16 NOTE — Telephone Encounter (Signed)
Notified pt w/MD response. Made appt for this Monday to discuss pain in legs...Susan [name]

## 2016-03-16 NOTE — Telephone Encounter (Signed)
Would need visit to address the leg pain since this is a controlled substance we have not prescribed before for her. Rx for diflucan sent in for the yeast infection.

## 2016-03-19 ENCOUNTER — Ambulatory Visit: Payer: Medicare Other | Admitting: Internal Medicine

## 2016-03-20 DIAGNOSIS — E119 Type 2 diabetes mellitus without complications: Secondary | ICD-10-CM | POA: Diagnosis not present

## 2016-03-20 LAB — HM DIABETES EYE EXAM

## 2016-03-28 ENCOUNTER — Ambulatory Visit: Payer: Self-pay

## 2016-03-29 ENCOUNTER — Telehealth: Payer: Self-pay

## 2016-03-29 ENCOUNTER — Ambulatory Visit: Payer: Self-pay

## 2016-03-29 NOTE — Telephone Encounter (Signed)
Patient in for AWV but 45 minutes late due to bus States she needs px filled for Seroquel and gabapentin Will request Dr. Sharlet Salina review the gabapentin for refill  The patient states she is out of he seroquel. States she has apt at [facility] on 8/17 but needs her medicine to remain stable.  Call to [facility]; Scheduled 10 minutes walk in apt tomorrow at 12:40; the patient agreed to have [facility] call me for request to take prior to a 3 day notice for urgent approval; but later stated she would see if a family member can take her.  Stated I will be here until 5pm today for assistance if needed.  Dr. Sharlet Salina, Can you fill her gabapentin or Amy  Tks
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
## 2016-03-30 NOTE — Telephone Encounter (Signed)
Gabapentin sent in on 03/13/16 and should not need refill yet.

## 2016-04-02 ENCOUNTER — Other Ambulatory Visit: Payer: Medicare Other

## 2016-04-02 DIAGNOSIS — Z113 Encounter for screening for infections with a predominantly sexual mode of transmission: Secondary | ICD-10-CM

## 2016-04-02 DIAGNOSIS — Z79899 Other long term (current) drug therapy: Secondary | ICD-10-CM

## 2016-04-02 DIAGNOSIS — B20 Human immunodeficiency virus [HIV] disease: Secondary | ICD-10-CM

## 2016-04-02 LAB — COMPREHENSIVE METABOLIC PANEL
ALT: 11 U/L (ref 6–29)
AST: 13 U/L (ref 10–35)
Albumin: 3.9 g/dL (ref 3.6–5.1)
Alkaline Phosphatase: 123 U/L (ref 33–130)
BILIRUBIN TOTAL: 0.3 mg/dL (ref 0.2–1.2)
BUN: 7 mg/dL (ref 7–25)
CALCIUM: 8.9 mg/dL (ref 8.6–10.4)
CHLORIDE: 105 mmol/L (ref 98–110)
CO2: 24 mmol/L (ref 20–31)
Creat: 0.62 mg/dL (ref 0.50–1.05)
GLUCOSE: 222 mg/dL — AB (ref 65–99)
Potassium: 4.1 mmol/L (ref 3.5–5.3)
Sodium: 139 mmol/L (ref 135–146)
Total Protein: 6.2 g/dL (ref 6.1–8.1)

## 2016-04-02 LAB — CBC
HEMATOCRIT: 45.9 % — AB (ref 35.0–45.0)
Hemoglobin: 15.3 g/dL (ref 11.7–15.5)
MCH: 30.5 pg (ref 27.0–33.0)
MCHC: 33.3 g/dL (ref 32.0–36.0)
MCV: 91.4 fL (ref 80.0–100.0)
MPV: 9.5 fL (ref 7.5–12.5)
Platelets: 270 10*3/uL (ref 140–400)
RBC: 5.02 MIL/uL (ref 3.80–5.10)
RDW: 14.7 % (ref 11.0–15.0)
WBC: 7.3 10*3/uL (ref 3.8–10.8)

## 2016-04-02 LAB — LIPID PANEL
CHOL/HDL RATIO: 3.8 ratio (ref <=5.0)
Cholesterol: 199 mg/dL (ref 125–200)
HDL: 53 mg/dL (ref >=46)
LDL CALC: 119 mg/dL (ref <130)
Triglycerides: 133 mg/dL (ref <150)
VLDL: 27 mg/dL (ref <30)

## 2016-04-03 ENCOUNTER — Ambulatory Visit: Payer: Self-pay | Admitting: Internal Medicine

## 2016-04-03 LAB — RPR

## 2016-04-03 LAB — URINE CYTOLOGY ANCILLARY ONLY
Chlamydia: NEGATIVE
Neisseria Gonorrhea: NEGATIVE

## 2016-04-04 LAB — T-HELPER CELL (CD4) - (RCID CLINIC ONLY)
CD4 T CELL ABS: 560 /uL (ref 400–2700)
CD4 T CELL HELPER: 23 % — AB (ref 33–55)

## 2016-04-05 LAB — HIV-1 RNA QUANT-NO REFLEX-BLD: HIV 1 RNA Quant: <20 copies/mL (ref <20)

## 2016-04-09 ENCOUNTER — Telehealth: Payer: Self-pay | Admitting: Endocrinology

## 2016-04-09 ENCOUNTER — Other Ambulatory Visit: Payer: Self-pay | Admitting: Internal Medicine

## 2016-04-09 NOTE — Telephone Encounter (Signed)
[facility] calls calling to report an A1C 8.7  Pt also had +3 sugar in urine, [name] calling # [phone]

## 2016-04-10 ENCOUNTER — Encounter: Payer: Self-pay | Admitting: Internal Medicine

## 2016-04-10 ENCOUNTER — Ambulatory Visit (INDEPENDENT_AMBULATORY_CARE_PROVIDER_SITE_OTHER): Payer: Medicare Other | Admitting: Internal Medicine

## 2016-04-10 DIAGNOSIS — R05 Cough: Secondary | ICD-10-CM | POA: Diagnosis not present

## 2016-04-10 DIAGNOSIS — R059 Cough, unspecified: Secondary | ICD-10-CM

## 2016-04-10 DIAGNOSIS — E119 Type 2 diabetes mellitus without complications: Secondary | ICD-10-CM

## 2016-04-10 MED ORDER — DICLOFENAC SODIUM 1 % TD GEL: 2.0000 g | Freq: Three times a day (TID) | TRANSDERMAL | 6 refills | Status: DC | PRN

## 2016-04-10 MED ORDER — CLONIDINE HCL 0.2 MG PO TABS: 0.2000 mg | ORAL_TABLET | Freq: Two times a day (BID) | ORAL | 6 refills | Status: DC

## 2016-04-10 MED ORDER — FLUTICASONE PROPIONATE 50 MCG/ACT NA SUSP: 2.0000 | Freq: Every day | NASAL | 6 refills | Status: DC

## 2016-04-10 NOTE — Patient Instructions (Signed)
We have sent in flonase which is a nose spray. Use 2 sprays in each nostril once a day to help with the allergy symptoms. It can take 2-3 days to work all the way and the cough can take 1-2 weeks to stop altogether once the allergy symptoms are gone.   We have also sent in voltaren gel to help with the pain in the legs in the evening. You can use it up to 3 times per day to help with pain and it is okay to use anywhere except in the eyes if needed for pulled muscle or joint pain.

## 2016-04-10 NOTE — Progress Notes (Signed)
Pre visit review using our clinic review tool, if applicable. No additional management support is needed unless otherwise documented below in the visit note. 

## 2016-04-10 NOTE — Progress Notes (Signed)
   Subjective:    Patient ID: Susan [name], female    DOB: [birth year], 55 y.o.   MRN: [id number]  HPI The patient is a 55 YO female coming in for cough for 1 week as well as nasal congestion and drainage. She is not having fevers or chills. Does have HIV but recent viral load is low and CD4 count is okay. Taking her medication as prescribed. No SOB but cough with mild sputum clear to white. She is having mild sinus pressure which is above her eyes. Not taking anything for allergies at this time.   Also having pain in her legs associated with her diabetes. She is taking gabapentin which has helped. Bad reaction to lyrica in the past. Is trying some OTC creams with minimal relief. Worse in the evening when she is trying to sleep.   Review of Systems  Constitutional: Negative for activity change, appetite change, chills, fatigue, fever and unexpected weight change.  HENT: Positive for congestion, postnasal drip, rhinorrhea and sinus pressure. Negative for ear discharge, ear pain, sore throat and trouble swallowing.   Eyes: Positive for discharge.       Watering eyes  Respiratory: Positive for cough. Negative for choking, chest tightness, shortness of breath and wheezing.   Cardiovascular: Negative for chest pain, palpitations and leg swelling.  Gastrointestinal: Negative for abdominal distention, abdominal pain, constipation and diarrhea.  Musculoskeletal: Positive for arthralgias and myalgias. Negative for back pain and gait problem.  Skin: Negative.   Neurological: Positive for numbness. Negative for dizziness, weakness, light-headedness and headaches.      Objective:   Physical Exam  Constitutional: She is oriented to person, place, and time. She appears well-developed and well-nourished.  Overweight  HENT:  Head: Normocephalic and atraumatic.  Ear canals with some redness, TM normal bilaterally. Oropharynx with mild redness and clear drianage.   Eyes: EOM are normal.  Neck: Normal  range of motion. No JVD present.  Cardiovascular: Normal rate and regular rhythm.   Pulmonary/Chest: Effort normal and breath sounds normal. No respiratory distress. She has no wheezes. She has no rales.  Abdominal: Soft. Bowel sounds are normal. She exhibits no distension. There is no tenderness. There is no rebound.  Musculoskeletal: She exhibits no edema.  Lymphadenopathy:    She has no cervical adenopathy.  Neurological: She is alert and oriented to person, place, and time.  Skin: Skin is warm and dry.   Vitals:   04/10/16 1426  BP: (!) 158/80  Pulse: 85  Resp: (!) 22  Temp: 98.4 F (36.9 C)  TempSrc: Oral  SpO2: 99%  Weight: 243 lb 12.8 oz (110.6 kg)  Height: 5\' 3"  (1.6 m)      Assessment & Plan:

## 2016-04-10 NOTE — Assessment & Plan Note (Signed)
Adding voltaren gel to her regimen for the pain at night time in her legs. She will continue taking gabapentin 300 mg TID. She continues her humulin insulin and seeing endocrinology next week.

## 2016-04-10 NOTE — Assessment & Plan Note (Signed)
Encouraged to stop smoking. No indication for x-ray or antibiotics today. She is given rx for flonase to help with her allergies and call us if not improved. Let her know that the cough can last 1-2 weeks after her allergy symptoms are gone.

## 2016-04-11 NOTE — Telephone Encounter (Signed)
See below to be advised for pt's up coming appointment on 04/18/2016.

## 2016-04-16 ENCOUNTER — Ambulatory Visit: Payer: Self-pay | Admitting: Infectious Diseases

## 2016-04-17 ENCOUNTER — Encounter: Payer: Self-pay | Admitting: Internal Medicine

## 2016-04-17 ENCOUNTER — Ambulatory Visit: Payer: Medicare Other | Admitting: *Deleted

## 2016-04-17 ENCOUNTER — Telehealth: Payer: Self-pay | Admitting: *Deleted

## 2016-04-17 ENCOUNTER — Ambulatory Visit (INDEPENDENT_AMBULATORY_CARE_PROVIDER_SITE_OTHER): Payer: Medicare Other | Admitting: Internal Medicine

## 2016-04-17 VITALS — BP 157/90 | Temp 98.5°F | Ht 63.0 in | Wt 263.0 lb

## 2016-04-17 DIAGNOSIS — R05 Cough: Secondary | ICD-10-CM | POA: Diagnosis not present

## 2016-04-17 DIAGNOSIS — B20 Human immunodeficiency virus [HIV] disease: Secondary | ICD-10-CM | POA: Diagnosis not present

## 2016-04-17 DIAGNOSIS — Z23 Encounter for immunization: Secondary | ICD-10-CM | POA: Diagnosis not present

## 2016-04-17 DIAGNOSIS — R059 Cough, unspecified: Secondary | ICD-10-CM

## 2016-04-17 DIAGNOSIS — F332 Major depressive disorder, recurrent severe without psychotic features: Secondary | ICD-10-CM | POA: Diagnosis not present

## 2016-04-17 MED ORDER — BECLOMETHASONE DIPROP MONOHYD 42 MCG/SPRAY NA SUSP: 2.0000 | Freq: Two times a day (BID) | NASAL | 12 refills | Status: DC

## 2016-04-17 NOTE — Telephone Encounter (Signed)
Patient will need to restart Hep B series. Was only given one dose of adolescent.  Susan [name]

## 2016-04-17 NOTE — Addendum Note (Signed)
Addended by: Myrtis Hopping A on: 04/17/2016 10:12 AM   Modules accepted: Orders

## 2016-04-17 NOTE — Assessment & Plan Note (Signed)
I will have her stop the Flonase and use beclomethasone instead which is ok with Genvoya.

## 2016-04-17 NOTE — Progress Notes (Signed)
CC: Follow up for HIV  Interval history: Currently is asymptomatic and well-controlled on Genvoya.  Since last visit she has had a mild episode of diarrhea but resolved.  Has no associated weight loss, no concerns.  Denies any missed doses.     Also has depression and is now going back to [place] and on the appropriate medications and HTN with DM.  She sees Dr. Loanne Drilling for DM and has appt tomorrow.  She is pleasant and pleased with he care.  She was recently started on Flonase by her PCP, though hasn't yet started it.   Prior to Admission medications   Medication Sig Start Date End Date Taking? Authorizing Provider  amLODipine (NORVASC) 10 MG tablet Take 1 tablet (10 mg total) by mouth daily. 10/11/15  Yes Hoyt Koch, MD  B-D ULTRAFINE III SHORT PEN 31G X 8 MM MISC USE AS DIRECTED TWICE DAILY 03/05/16  Yes Renato Shin, MD  cloNIDine (CATAPRES) 0.2 MG tablet Take 1 tablet (0.2 mg total) by mouth 2 (two) times daily. 04/10/16  Yes Hoyt Koch, MD  diclofenac sodium (VOLTAREN) 1 % GEL Apply 2 g topically 3 (three) times daily as needed. 04/10/16  Yes Hoyt Koch, MD  elvitegravir-cobicistat-emtricitabine-tenofovir (GENVOYA) 150-150-200-10 MG TABS tablet Take 1 tablet by mouth daily. 10/05/15  Yes Campbell Riches, MD  enalapril (VASOTEC) 20 MG tablet Take 1 tablet (20 mg total) by mouth daily. 10/11/15  Yes Hoyt Koch, MD  fluticasone (FLONASE) 50 MCG/ACT nasal spray Place 2 sprays into both nostrils daily. 04/10/16  Yes Hoyt Koch, MD  gabapentin (NEURONTIN) 300 MG capsule TAKE 2 CAPSULES BY MOUTH THREE TIMES DAILY 04/10/16  Yes Hoyt Koch, MD  glucose blood (ONETOUCH VERIO) test strip 1 each by Other route 2 (two) times daily. And lancets 2/day 250.01: For blood sugar checks 02/08/15  Yes Encarnacion Slates, NP  hydrOXYzine (VISTARIL) 25 MG capsule TK 1 C PO TID PRA 12/21/15  Yes Historical Provider, MD  Insulin NPH, Human,, Isophane, (HUMULIN N KWIKPEN) 100  UNIT/ML Kiwkpen Inject 65 Units into the skin every morning. And pen needles 2/day 09/20/15  Yes Renato Shin, MD  omeprazole (PRILOSEC) 20 MG capsule Take 1 capsule (20 mg total) by mouth daily. Appointment needed for additional refills 05/17/15  Yes Niel Hummer, NP  QUEtiapine (SEROQUEL) 100 MG tablet Take 1 tablet (100 mg total) by mouth daily. 10/11/15  Yes Hoyt Koch, MD  QUEtiapine (SEROQUEL) 400 MG tablet Take 1 tablet (400 mg total) by mouth at bedtime. 10/11/15  Yes Hoyt Koch, MD  simvastatin (ZOCOR) 40 MG tablet Take 1 tablet (40 mg total) by mouth daily. For high cholesterol 02/08/15  Yes Encarnacion Slates, NP    Review of Systems Constitutional: negative for malaise Musculoskeletal: negative for myalgias and arthralgias All other systems reviewed and are negative   Physical Exam: CONSTITUTIONAL:in no apparent distress  Vitals:   04/17/16 0919  BP: (!) 174/143  Temp: 98.5 F (36.9 C)   Eyes: anicteric HENT: no thrush, no cervical lymphadenopathy Respiratory: Normal respiratory effort; CTA B  Lab Results  Component Value Date   HIV1RNAQUANT <20 04/02/2016   HIV1RNAQUANT <20 10/11/2015   HIV1RNAQUANT <20 09/21/2015   No components found for: HIV1GENOTYPRPLUS No components found for: THELPERCELL

## 2016-04-17 NOTE — BH Specialist Note (Signed)
Counselor met with Susan [name] today in the exam room for a warm hand off via Dr. Linus Salmons.  Patient was oriented times four with good affect and dress.  Patient was alert and talkative.  Patient reported no substance abuse at this time. Patient did say that she has been struggling with some medical issues and this had gotten her down.  Counselor provided support and encouragement with patient as she freely shared. Patient asked about he support group and stated that she needs a group like that to help keep her focused on positive things and that this would also help her efforts towards sustaining her sobriety.  Counselor provided patient  The necessary information about the [facility] support groups and recommended that she come.  Rolena Infante, MA, LPC [facility]

## 2016-04-17 NOTE — Assessment & Plan Note (Signed)
Doing great on ARV and separated from ppi.  RTC 6 months.  Lipids followed by PCP

## 2016-04-17 NOTE — Assessment & Plan Note (Signed)
She feels she is  Now in good control with current medications

## 2016-04-17 NOTE — Assessment & Plan Note (Signed)
Encouraged weight loss 

## 2016-04-18 ENCOUNTER — Other Ambulatory Visit: Payer: Self-pay | Admitting: Endocrinology

## 2016-04-18 ENCOUNTER — Ambulatory Visit (INDEPENDENT_AMBULATORY_CARE_PROVIDER_SITE_OTHER): Payer: Medicare Other | Admitting: Endocrinology

## 2016-04-18 ENCOUNTER — Encounter: Payer: Self-pay | Admitting: Endocrinology

## 2016-04-18 VITALS — BP 134/92 | HR 104 | Temp 98.2°F | Ht 63.0 in | Wt 271.0 lb

## 2016-04-18 DIAGNOSIS — D582 Other hemoglobinopathies: Secondary | ICD-10-CM | POA: Diagnosis not present

## 2016-04-18 DIAGNOSIS — E119 Type 2 diabetes mellitus without complications: Secondary | ICD-10-CM

## 2016-04-18 DIAGNOSIS — Z794 Long term (current) use of insulin: Secondary | ICD-10-CM

## 2016-04-18 LAB — POCT GLYCOSYLATED HEMOGLOBIN (HGB A1C): HEMOGLOBIN A1C: 10.2

## 2016-04-18 MED ORDER — INSULIN ISOPHANE HUMAN 100 UNIT/ML KWIKPEN: 65.0000 [IU] | PEN_INJECTOR | SUBCUTANEOUS | 11 refills | Status: DC

## 2016-04-18 NOTE — Patient Instructions (Addendum)
Please continue the same insulin.   blood tests are requested for you today.  We'll let you know about the results. On this type of insulin schedule, you should eat meals on a regular schedule.  If a meal is missed or significantly delayed, your blood sugar could go low.  check your blood sugar twice a day.  vary the time of day when you check, between before the 3 meals, and at bedtime.  also check if you have symptoms of your blood sugar being too high or too low.  please keep a record of the readings and bring it to your next appointment here (or you can bring the meter itself).  You can write it on any piece of paper.  please call us sooner if your blood sugar goes below 70, or if you have a lot of readings over 200. Please come back for a follow-up appointment in 2 months.
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
## 2016-04-18 NOTE — Progress Notes (Signed)
   Subjective:    Patient ID: Susan [name], female    DOB: [birth year], 55 y.o.   MRN: [id number]  HPI    Review of Systems     Objective:   Physical Exam        Assessment & Plan:

## 2016-04-20 LAB — HEMOGLOBINOPATHY EVALUATION
HCT: 38.3 % (ref 35.0–45.0)
HEMOGLOBIN: 12.4 g/dL (ref 11.7–15.5)
HGB A2 QUANT: 2.2 % (ref 1.8–3.5)
HGB A: 96.8 % (ref >96.0)
Hgb F Quant: <1 % (ref <2.0)
MCH: 30.4 pg (ref 27.0–33.0)
MCV: 93.9 fL (ref 80.0–100.0)
RBC: 4.08 MIL/uL (ref 3.80–5.10)
RDW: 15.8 % — AB (ref 11.0–15.0)

## 2016-05-03 ENCOUNTER — Other Ambulatory Visit: Payer: Self-pay | Admitting: Internal Medicine

## 2016-05-05 ENCOUNTER — Other Ambulatory Visit: Payer: Self-pay | Admitting: Internal Medicine

## 2016-05-17 ENCOUNTER — Ambulatory Visit (INDEPENDENT_AMBULATORY_CARE_PROVIDER_SITE_OTHER): Payer: Medicare Other | Admitting: *Deleted

## 2016-05-17 DIAGNOSIS — Z23 Encounter for immunization: Secondary | ICD-10-CM

## 2016-05-17 DIAGNOSIS — B20 Human immunodeficiency virus [HIV] disease: Secondary | ICD-10-CM

## 2016-05-28 ENCOUNTER — Encounter: Payer: Self-pay | Admitting: Internal Medicine

## 2016-06-04 ENCOUNTER — Other Ambulatory Visit: Payer: Self-pay | Admitting: Internal Medicine

## 2016-06-08 ENCOUNTER — Ambulatory Visit (INDEPENDENT_AMBULATORY_CARE_PROVIDER_SITE_OTHER): Payer: Medicare Other | Admitting: Endocrinology

## 2016-06-08 ENCOUNTER — Encounter: Payer: Self-pay | Admitting: Endocrinology

## 2016-06-08 VITALS — BP 144/94 | HR 103 | Ht 63.0 in | Wt 266.0 lb

## 2016-06-08 DIAGNOSIS — E119 Type 2 diabetes mellitus without complications: Secondary | ICD-10-CM

## 2016-06-08 MED ORDER — INSULIN ISOPHANE HUMAN 100 UNIT/ML KWIKPEN: 80.0000 [IU] | PEN_INJECTOR | SUBCUTANEOUS | 11 refills | Status: DC
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
## 2016-06-08 NOTE — Patient Instructions (Addendum)
Please increase the NPH insulin to 80 units each morning.    On this type of insulin schedule, you should eat meals on a regular schedule.  If a meal is missed or significantly delayed, your blood sugar could go low.   To help you remember the insulin, put it next to something you use each morning, such as your toothbrush.   check your blood sugar twice a day.  vary the time of day when you check, between before the 3 meals, and at bedtime.  also check if you have symptoms of your blood sugar being too high or too low.  please keep a record of the readings and bring it to your next appointment here (or you can bring the meter itself).  You can write it on any piece of paper.  please call us sooner if your blood sugar goes below 70, or if you have a lot of readings over 200. Please come back for a follow-up appointment in 1 month.

## 2016-06-18 ENCOUNTER — Ambulatory Visit: Payer: Self-pay | Admitting: Endocrinology

## 2016-07-02 ENCOUNTER — Other Ambulatory Visit: Payer: Self-pay | Admitting: Internal Medicine

## 2016-07-09 ENCOUNTER — Ambulatory Visit (INDEPENDENT_AMBULATORY_CARE_PROVIDER_SITE_OTHER): Payer: Medicare Other | Admitting: Endocrinology

## 2016-07-09 VITALS — BP 138/80 | HR 51 | Ht 63.0 in | Wt 272.0 lb

## 2016-07-09 DIAGNOSIS — E119 Type 2 diabetes mellitus without complications: Secondary | ICD-10-CM

## 2016-07-09 LAB — POCT GLYCOSYLATED HEMOGLOBIN (HGB A1C): Hemoglobin A1C: 7.6

## 2016-07-09 MED ORDER — GLUCOSE BLOOD VI STRP: 1.0000 | ORAL_STRIP | Freq: Two times a day (BID) | 12 refills | Status: DC
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
## 2016-07-09 NOTE — Patient Instructions (Addendum)
Please continue the NPH insulin, 80 units each morning.    On this type of insulin schedule, you should eat meals on a regular schedule.  If a meal is missed or significantly delayed, your blood sugar could go low.   To help you remember the insulin, put it next to something you use each morning, such as your toothbrush.   check your blood sugar twice a day.  vary the time of day when you check, between before the 3 meals, and at bedtime.  also check if you have symptoms of your blood sugar being too high or too low.  please keep a record of the readings and bring it to your next appointment here (or you can bring the meter itself).  You can write it on any piece of paper.  please call us sooner if your blood sugar goes below 70, or if you have a lot of readings over 200. Please come back for a follow-up appointment in 4 months.

## 2016-07-11 ENCOUNTER — Encounter: Payer: Self-pay | Admitting: Internal Medicine

## 2016-07-11 ENCOUNTER — Ambulatory Visit (INDEPENDENT_AMBULATORY_CARE_PROVIDER_SITE_OTHER): Payer: Medicare Other | Admitting: Internal Medicine

## 2016-07-11 DIAGNOSIS — R05 Cough: Secondary | ICD-10-CM | POA: Diagnosis not present

## 2016-07-11 DIAGNOSIS — M150 Primary generalized (osteo)arthritis: Secondary | ICD-10-CM | POA: Diagnosis not present

## 2016-07-11 DIAGNOSIS — F17200 Nicotine dependence, unspecified, uncomplicated: Secondary | ICD-10-CM | POA: Diagnosis not present

## 2016-07-11 DIAGNOSIS — M159 Polyosteoarthritis, unspecified: Secondary | ICD-10-CM

## 2016-07-11 DIAGNOSIS — R059 Cough, unspecified: Secondary | ICD-10-CM

## 2016-07-11 MED ORDER — AMLODIPINE BESYLATE 10 MG PO TABS: 10.0000 mg | ORAL_TABLET | Freq: Every day | ORAL | 3 refills | Status: DC

## 2016-07-11 MED ORDER — HYDROCODONE-HOMATROPINE 5-1.5 MG/5ML PO SYRP: 5.0000 mL | ORAL_SOLUTION | Freq: Three times a day (TID) | ORAL | 0 refills | Status: DC | PRN

## 2016-07-11 MED ORDER — GABAPENTIN 300 MG PO CAPS: 600.0000 mg | ORAL_CAPSULE | Freq: Three times a day (TID) | ORAL | 3 refills | Status: DC

## 2016-07-11 MED ORDER — ALBUTEROL SULFATE HFA 108 (90 BASE) MCG/ACT IN AERS: 2.0000 | INHALATION_SPRAY | Freq: Four times a day (QID) | RESPIRATORY_TRACT | 2 refills | Status: DC | PRN

## 2016-07-11 MED ORDER — PREDNISONE 20 MG PO TABS: 40.0000 mg | ORAL_TABLET | Freq: Every day | ORAL | 0 refills | Status: DC

## 2016-07-11 MED ORDER — CLONIDINE HCL 0.2 MG PO TABS: 0.2000 mg | ORAL_TABLET | Freq: Two times a day (BID) | ORAL | 3 refills | Status: DC

## 2016-07-11 MED ORDER — TRAMADOL HCL 50 MG PO TABS: 50.0000 mg | ORAL_TABLET | Freq: Two times a day (BID) | ORAL | 0 refills | Status: DC | PRN

## 2016-07-11 NOTE — Patient Instructions (Signed)
We have sent in the prednisone. Take 2 pills daily for the next 5 days to help with the breathing.   We have given you a cough syrup which you can use in the evening for the cough.   We have also sent in an inhaler to use for the breathing.   We have given you a prescription for tramadol which you can use 1 pill up to 2 times per day for pain.

## 2016-07-11 NOTE — Progress Notes (Signed)
Pre visit review using our clinic review tool, if applicable. No additional management support is needed unless otherwise documented below in the visit note. 

## 2016-07-11 NOTE — Progress Notes (Signed)
   Subjective:    Patient ID: Susan [name], female    DOB: 1961-07-08, 55 y.o.   MRN: [id number]  HPI The patient is a 55 YO female coming in for several acute concerns. She is having a cough (started weeks ago and given some treatment but it is not better, she is using nasal spray and allergy medicine without relief, some SOB and sputum, no fevers but some chills), and pain in her joints (tried tylenol and ibuprofen over the counter without relief, has tried tramadol in the past which did help the pain, mostly in her back and knees, she is overweight and is working on losing some weight, no recent injury just worsening of her chronic pain), and her smoking (she is concerned that this is causing the cough and the breathing problems, she would like to stop but is concerned about side effects from medications to help her, she has tried in the past cold [place] with limited success, not ready to quit until she is feeling slightly better).   Review of Systems  Constitutional: Positive for activity change, chills and fatigue. Negative for appetite change, fever and unexpected weight change.  HENT: Positive for congestion and rhinorrhea. Negative for ear discharge, ear pain, postnasal drip, sinus pain, sinus pressure and sore throat.   Eyes: Negative.   Respiratory: Positive for cough, shortness of breath and wheezing. Negative for chest tightness.   Cardiovascular: Negative.   Gastrointestinal: Negative.   Musculoskeletal: Positive for arthralgias and back pain. Negative for gait problem, myalgias and neck pain.  Skin: Negative.   Neurological: Negative.   Psychiatric/Behavioral: Negative.       Objective:   Physical Exam  Constitutional: She is oriented to person, place, and time. She appears well-developed and well-nourished.  HENT:  Head: Normocephalic and atraumatic.  Right Ear: External ear normal.  Left Ear: External ear normal.  Mild erythema and clear drainage oropharynx, nose clear, no  sinus pain.   Eyes: EOM are normal.  Neck: Normal range of motion. No JVD present.  Cardiovascular: Normal rate and regular rhythm.   Pulmonary/Chest: Effort normal. No respiratory distress. She has wheezes. She has no rales. She exhibits no tenderness.  Mild wheezing with rhonchi which partially clears with cough.  Abdominal: Soft. She exhibits no distension. There is no tenderness. There is no rebound.  Musculoskeletal: She exhibits no edema.  Lymphadenopathy:    She has no cervical adenopathy.  Neurological: She is alert and oriented to person, place, and time. Coordination normal.  Skin: Skin is warm and dry.   Vitals:   07/11/16 1106 07/11/16 1129  BP: (!) 160/90 (!) 198/102  Pulse: (!) 103   Resp: 20   Temp: 98 F (36.7 C)   TempSrc: Oral   SpO2: 98%   Weight: 275 lb 1.9 oz (124.8 kg)   Height: 5\' 3"  (1.6 m)       Assessment & Plan:

## 2016-07-14 DIAGNOSIS — M1990 Unspecified osteoarthritis, unspecified site: Secondary | ICD-10-CM | POA: Insufficient documentation

## 2016-07-14 NOTE — Assessment & Plan Note (Signed)
She is ready to quit and we talked about different strategies and given the [place] number to call for patches or gum if she wants to. She does not want to try any medications to quit right now. We did talk about the fact that her cough is likely to take longer to resolve since she is a smoker and could be doing permanent damage to her lungs.

## 2016-07-14 NOTE — Assessment & Plan Note (Signed)
Rx for prednisone for wheezing on exam, rx for cough syrup as well as albuterol for the SOB with wheezing. Taught how to use in office. Does not need antibiotics again for now. Will continue to monitor. We talked about how her smoking is likely going to make her recovery slower than average.

## 2016-07-14 NOTE — Assessment & Plan Note (Signed)
Knee and back. Rx for tramadol today to help. She knows that her weight is related to some of the pain and would be alleviated if she was able to lose some weight.

## 2016-08-03 ENCOUNTER — Telehealth: Payer: Self-pay | Admitting: Internal Medicine

## 2016-08-03 MED ORDER — FLUCONAZOLE 150 MG PO TABS: 150.0000 mg | ORAL_TABLET | ORAL | 0 refills | Status: DC

## 2016-08-03 NOTE — Telephone Encounter (Signed)
Advised patient of dr crawfords note/instructions---patient repeated back for understanding

## 2016-08-03 NOTE — Telephone Encounter (Signed)
Sent in diflucan for her to take. If not gone in 3 days repeat.

## 2016-08-03 NOTE — Telephone Encounter (Signed)
Patient states she has a yeast infection and gets often b.c of HIV.  Patient would like to know if Dr. Sharlet Salina could send a script to [facility] on [address].

## 2016-10-13 ENCOUNTER — Other Ambulatory Visit: Payer: Self-pay | Admitting: Infectious Diseases

## 2016-10-13 DIAGNOSIS — Z79899 Other long term (current) drug therapy: Secondary | ICD-10-CM

## 2016-10-13 DIAGNOSIS — B20 Human immunodeficiency virus [HIV] disease: Secondary | ICD-10-CM

## 2016-10-13 DIAGNOSIS — Z113 Encounter for screening for infections with a predominantly sexual mode of transmission: Secondary | ICD-10-CM
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
## 2016-10-24 NOTE — Progress Notes (Deleted)
Pre visit review using our clinic review tool, if applicable. No additional management support is needed unless otherwise documented below in the visit note. 

## 2016-10-25 ENCOUNTER — Ambulatory Visit: Payer: Self-pay
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
## 2016-10-29 NOTE — Progress Notes (Deleted)
Pre visit review using our clinic review tool, if applicable. No additional management support is needed unless otherwise documented below in the visit note. 

## 2016-10-30 ENCOUNTER — Ambulatory Visit: Payer: Self-pay

## 2016-11-06 ENCOUNTER — Ambulatory Visit: Payer: Self-pay | Admitting: Endocrinology

## 2016-11-06 DIAGNOSIS — Z0289 Encounter for other administrative examinations: Secondary | ICD-10-CM

## 2016-11-07 ENCOUNTER — Telehealth: Payer: Self-pay | Admitting: Endocrinology

## 2016-11-07 NOTE — Telephone Encounter (Signed)
Patient no showed today's appt. Please advise on how to follow up. °A. No follow up necessary. °B. Follow up urgent. Contact patient immediately. °C. Follow up necessary. Contact patient and schedule visit in ___ days. °D. Follow up advised. Contact patient and schedule visit in ____weeks. ° °

## 2016-11-08 ENCOUNTER — Other Ambulatory Visit: Payer: Medicare Other

## 2016-11-08 DIAGNOSIS — B20 Human immunodeficiency virus [HIV] disease: Secondary | ICD-10-CM

## 2016-11-08 LAB — COMPLETE METABOLIC PANEL WITH GFR
ALBUMIN: 3.4 g/dL — AB (ref 3.6–5.1)
ALK PHOS: 115 U/L (ref 33–130)
ALT: 10 U/L (ref 6–29)
AST: 14 U/L (ref 10–35)
BUN: 5 mg/dL — AB (ref 7–25)
CO2: 22 mmol/L (ref 20–31)
CREATININE: 0.79 mg/dL (ref 0.50–1.05)
Calcium: 9 mg/dL (ref 8.6–10.4)
Chloride: 106 mmol/L (ref 98–110)
GFR, EST NON AFRICAN AMERICAN: 85 mL/min (ref >=60)
GFR, Est African American: >89 mL/min (ref >=60)
GLUCOSE: 197 mg/dL — AB (ref 65–99)
Potassium: 4.3 mmol/L (ref 3.5–5.3)
Sodium: 140 mmol/L (ref 135–146)
TOTAL PROTEIN: 6.2 g/dL (ref 6.1–8.1)
Total Bilirubin: 0.3 mg/dL (ref 0.2–1.2)

## 2016-11-08 LAB — CBC WITH DIFFERENTIAL/PLATELET
BASOS ABS: 0 {cells}/uL (ref 0–200)
Basophils Relative: 0 %
EOS PCT: 5 %
Eosinophils Absolute: 305 cells/uL (ref 15–500)
HCT: 41.7 % (ref 35.0–45.0)
HEMOGLOBIN: 13.8 g/dL (ref 11.7–15.5)
LYMPHS ABS: 2013 {cells}/uL (ref 850–3900)
Lymphocytes Relative: 33 %
MCH: 30.2 pg (ref 27.0–33.0)
MCHC: 33.1 g/dL (ref 32.0–36.0)
MCV: 91.2 fL (ref 80.0–100.0)
MPV: 8.3 fL (ref 7.5–12.5)
Monocytes Absolute: 488 cells/uL (ref 200–950)
Monocytes Relative: 8 %
NEUTROS ABS: 3294 {cells}/uL (ref 1500–7800)
Neutrophils Relative %: 54 %
Platelets: 276 10*3/uL (ref 140–400)
RBC: 4.57 MIL/uL (ref 3.80–5.10)
RDW: 15.1 % — ABNORMAL HIGH (ref 11.0–15.0)
WBC: 6.1 10*3/uL (ref 3.8–10.8)

## 2016-11-08 NOTE — Telephone Encounter (Signed)
Please come back for a follow-up appointment in 6 weeks  

## 2016-11-09 LAB — T-HELPER CELL (CD4) - (RCID CLINIC ONLY)
CD4 T CELL ABS: 530 /uL (ref 400–2700)
CD4 T CELL HELPER: 23 % — AB (ref 33–55)

## 2016-11-10 LAB — HIV-1 RNA QUANT-NO REFLEX-BLD
HIV 1 RNA Quant: <20 copies/mL
HIV-1 RNA QUANT, LOG: NOT DETECTED {Log_copies}/mL

## 2016-11-22 ENCOUNTER — Ambulatory Visit (INDEPENDENT_AMBULATORY_CARE_PROVIDER_SITE_OTHER): Payer: Medicare Other | Admitting: Internal Medicine

## 2016-11-22 ENCOUNTER — Encounter: Payer: Self-pay | Admitting: Internal Medicine

## 2016-11-22 VITALS — BP 167/95 | HR 91 | Temp 98.2°F | Wt 274.0 lb

## 2016-11-22 DIAGNOSIS — F333 Major depressive disorder, recurrent, severe with psychotic symptoms: Secondary | ICD-10-CM

## 2016-11-22 DIAGNOSIS — Z113 Encounter for screening for infections with a predominantly sexual mode of transmission: Secondary | ICD-10-CM

## 2016-11-22 DIAGNOSIS — B20 Human immunodeficiency virus [HIV] disease: Secondary | ICD-10-CM | POA: Diagnosis not present

## 2016-11-22 NOTE — Assessment & Plan Note (Signed)
Some tearfullness today but no SI.  Continuing to go to [facility]

## 2016-11-22 NOTE — Progress Notes (Signed)
CC: Follow up for HIV  Interval history: Currently is asymptomatic and well-controlled on Genvoya. Denies any missed doses.     Also has depression and continuing to go to [place] and on the appropriate medications and HTN with DM.  No associated n/v/d. Some recent memory issues and going to see her PCP about this   Prior to Admission medications   Medication Sig Start Date End Date Taking? Authorizing Provider  amLODipine (NORVASC) 10 MG tablet Take 1 tablet (10 mg total) by mouth daily. 10/11/15  Yes Hoyt Koch, MD  B-D ULTRAFINE III SHORT PEN 31G X 8 MM MISC USE AS DIRECTED TWICE DAILY 03/05/16  Yes Renato Shin, MD  cloNIDine (CATAPRES) 0.2 MG tablet Take 1 tablet (0.2 mg total) by mouth 2 (two) times daily. 04/10/16  Yes Hoyt Koch, MD  diclofenac sodium (VOLTAREN) 1 % GEL Apply 2 g topically 3 (three) times daily as needed. 04/10/16  Yes Hoyt Koch, MD  elvitegravir-cobicistat-emtricitabine-tenofovir (GENVOYA) 150-150-200-10 MG TABS tablet Take 1 tablet by mouth daily. 10/05/15  Yes Campbell Riches, MD  enalapril (VASOTEC) 20 MG tablet Take 1 tablet (20 mg total) by mouth daily. 10/11/15  Yes Hoyt Koch, MD  fluticasone (FLONASE) 50 MCG/ACT nasal spray Place 2 sprays into both nostrils daily. 04/10/16  Yes Hoyt Koch, MD  gabapentin (NEURONTIN) 300 MG capsule TAKE 2 CAPSULES BY MOUTH THREE TIMES DAILY 04/10/16  Yes Hoyt Koch, MD  glucose blood (ONETOUCH VERIO) test strip 1 each by Other route 2 (two) times daily. And lancets 2/day 250.01: For blood sugar checks 02/08/15  Yes Encarnacion Slates, NP  hydrOXYzine (VISTARIL) 25 MG capsule TK 1 C PO TID PRA 12/21/15  Yes Historical Provider, MD  Insulin NPH, Human,, Isophane, (HUMULIN N KWIKPEN) 100 UNIT/ML Kiwkpen Inject 65 Units into the skin every morning. And pen needles 2/day 09/20/15  Yes Renato Shin, MD  omeprazole (PRILOSEC) 20 MG capsule Take 1 capsule (20 mg total) by mouth daily. Appointment needed  for additional refills 05/17/15  Yes Niel Hummer, NP  QUEtiapine (SEROQUEL) 100 MG tablet Take 1 tablet (100 mg total) by mouth daily. 10/11/15  Yes Hoyt Koch, MD  QUEtiapine (SEROQUEL) 400 MG tablet Take 1 tablet (400 mg total) by mouth at bedtime. 10/11/15  Yes Hoyt Koch, MD  simvastatin (ZOCOR) 40 MG tablet Take 1 tablet (40 mg total) by mouth daily. For high cholesterol 02/08/15  Yes Encarnacion Slates, NP    Review of Systems Constitutional: negative for malaise Musculoskeletal: negative for myalgias and arthralgias All other systems reviewed and are negative   Physical Exam: CONSTITUTIONAL:in no apparent distress  Vitals:   11/22/16 0950  BP: (!) 167/95  Pulse: 91  Temp: 98.2 F (36.8 C)   Eyes: anicteric HENT: no thrush, no cervical lymphadenopathy Respiratory: Normal respiratory effort; CTA B Cardiovascular: RRR  Lab Results  Component Value Date   HIV1RNAQUANT <20 NOT DETECTED 11/08/2016   HIV1RNAQUANT <20 04/02/2016   HIV1RNAQUANT <20 10/11/2015   No components found for: HIV1GENOTYPRPLUS No components found for: THELPERCELL

## 2016-11-22 NOTE — Assessment & Plan Note (Signed)
Doing well with this.  rtc 6 months.  

## 2016-11-28 ENCOUNTER — Ambulatory Visit: Payer: Medicare Other | Admitting: Endocrinology

## 2016-11-28 ENCOUNTER — Telehealth: Payer: Self-pay | Admitting: Endocrinology

## 2016-11-28 NOTE — Telephone Encounter (Signed)
Patient no showed today's appt. Please advise on how to follow up. °A. No follow up necessary. °B. Follow up urgent. Contact patient immediately. °C. Follow up necessary. Contact patient and schedule visit in ___ days. °D. Follow up advised. Contact patient and schedule visit in ____weeks. ° °

## 2016-11-28 NOTE — Telephone Encounter (Signed)
Please come back for a follow-up appointment in 6 weeks  

## 2016-12-05 NOTE — Telephone Encounter (Signed)
Message has been left to reschedule.

## 2016-12-14 ENCOUNTER — Other Ambulatory Visit: Payer: Self-pay | Admitting: Internal Medicine

## 2016-12-14 DIAGNOSIS — Z113 Encounter for screening for infections with a predominantly sexual mode of transmission: Secondary | ICD-10-CM

## 2016-12-14 DIAGNOSIS — Z79899 Other long term (current) drug therapy: Secondary | ICD-10-CM

## 2016-12-14 DIAGNOSIS — B20 Human immunodeficiency virus [HIV] disease: Secondary | ICD-10-CM

## 2017-01-08 ENCOUNTER — Ambulatory Visit (INDEPENDENT_AMBULATORY_CARE_PROVIDER_SITE_OTHER): Payer: Medicare Other | Admitting: Internal Medicine

## 2017-01-08 ENCOUNTER — Encounter: Payer: Self-pay | Admitting: Internal Medicine

## 2017-01-08 ENCOUNTER — Ambulatory Visit (INDEPENDENT_AMBULATORY_CARE_PROVIDER_SITE_OTHER)
Admission: RE | Admit: 2017-01-08 | Discharge: 2017-01-08 | Disposition: A | Discharge status: Home / Self Care | Payer: Medicare Other | Source: Ambulatory Visit | Attending: Internal Medicine | Admitting: Internal Medicine

## 2017-01-08 ENCOUNTER — Other Ambulatory Visit (INDEPENDENT_AMBULATORY_CARE_PROVIDER_SITE_OTHER): Payer: Medicare Other

## 2017-01-08 VITALS — BP 160/82 | HR 115 | Temp 98.3°F | Resp 16 | Ht 63.0 in | Wt 276.0 lb

## 2017-01-08 DIAGNOSIS — M159 Polyosteoarthritis, unspecified: Secondary | ICD-10-CM

## 2017-01-08 DIAGNOSIS — E119 Type 2 diabetes mellitus without complications: Secondary | ICD-10-CM

## 2017-01-08 DIAGNOSIS — M25561 Pain in right knee: Secondary | ICD-10-CM | POA: Diagnosis not present

## 2017-01-08 DIAGNOSIS — M150 Primary generalized (osteo)arthritis: Secondary | ICD-10-CM

## 2017-01-08 DIAGNOSIS — M25562 Pain in left knee: Secondary | ICD-10-CM | POA: Diagnosis not present

## 2017-01-08 DIAGNOSIS — M7989 Other specified soft tissue disorders: Secondary | ICD-10-CM | POA: Diagnosis not present

## 2017-01-08 DIAGNOSIS — I10 Essential (primary) hypertension: Secondary | ICD-10-CM | POA: Diagnosis not present

## 2017-01-08 LAB — LIPID PANEL
Cholesterol: 222 mg/dL — ABNORMAL HIGH (ref 0–200)
HDL: 50.4 mg/dL
NonHDL: 171.22
Total CHOL/HDL Ratio: 4
Triglycerides: 214 mg/dL — ABNORMAL HIGH (ref 0.0–149.0)
VLDL: 42.8 mg/dL — ABNORMAL HIGH (ref 0.0–40.0)

## 2017-01-08 LAB — HEMOGLOBIN A1C: Hgb A1c MFr Bld: 9.5 % — ABNORMAL HIGH (ref 4.6–6.5)

## 2017-01-08 LAB — LDL CHOLESTEROL, DIRECT: Direct LDL: 149 mg/dL

## 2017-01-08 IMAGING — DX DG KNEE COMPLETE 4+V*R*
4 series · 4 of 4 positions shown · non-contrast
Comparison: None.

CLINICAL DATA: Chronic pain and swelling

EXAM:
RIGHT KNEE - COMPLETE 4+ VIEW

[knee ap]
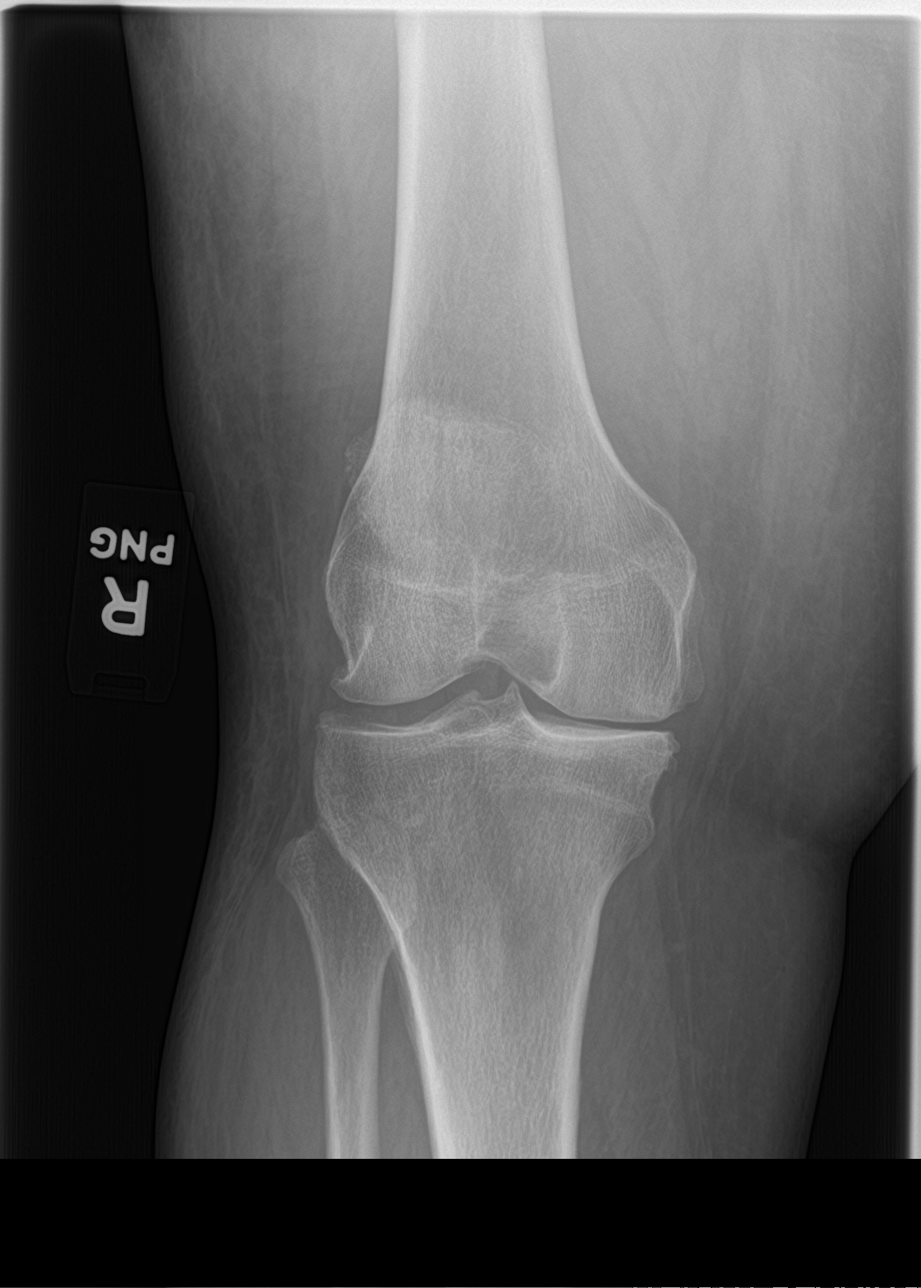

[knee tunnel]
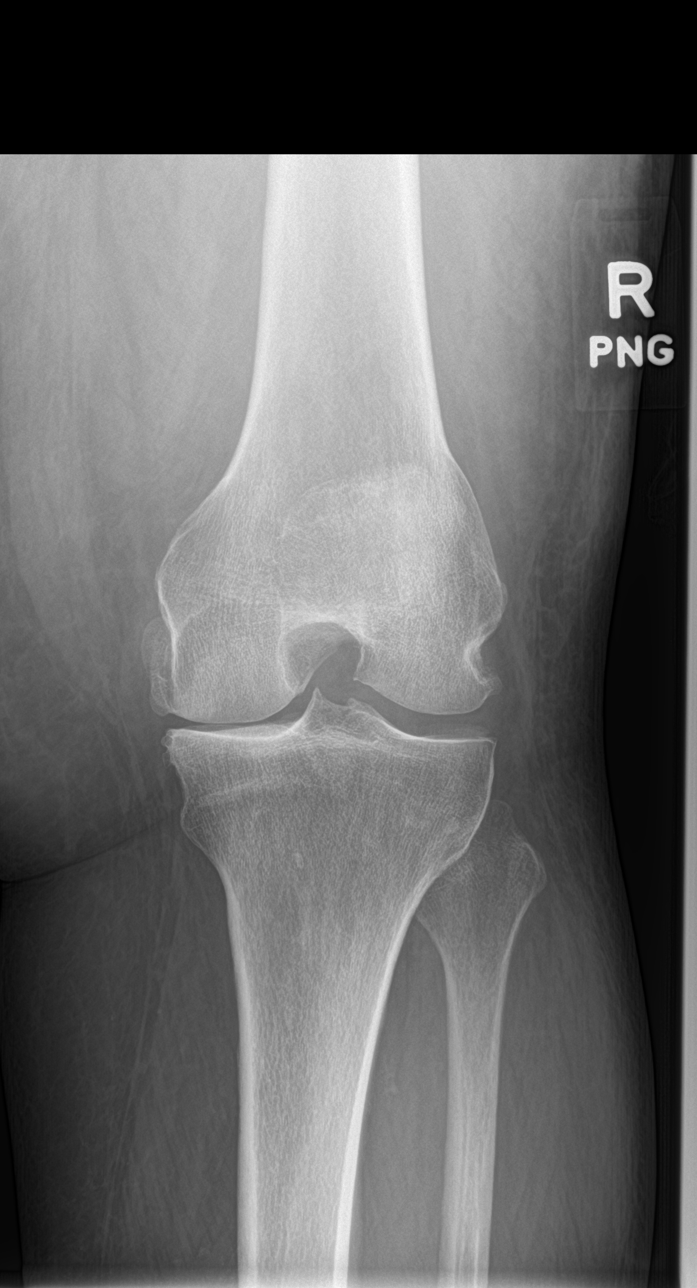

[knee lat]
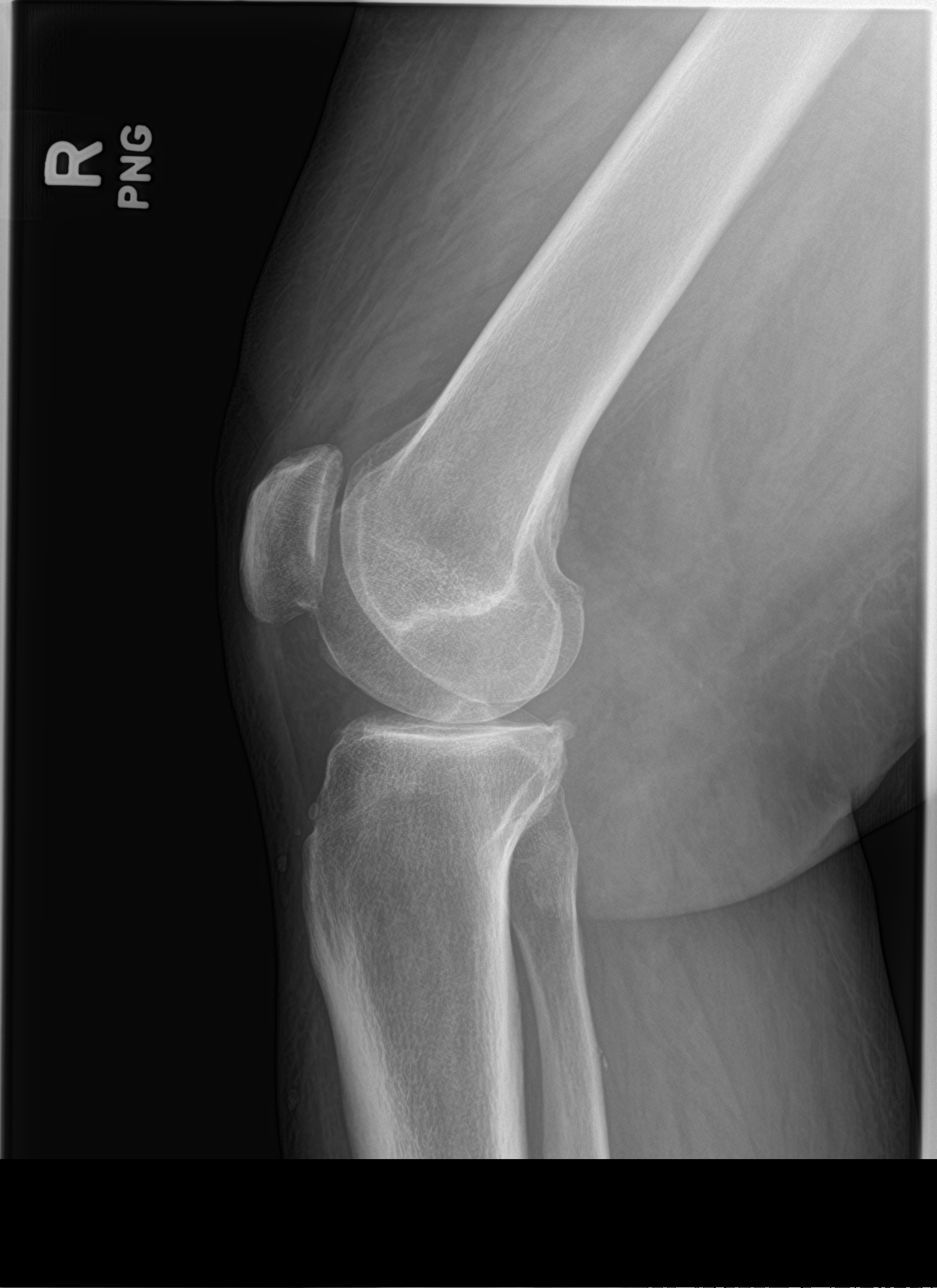

[sunrise]
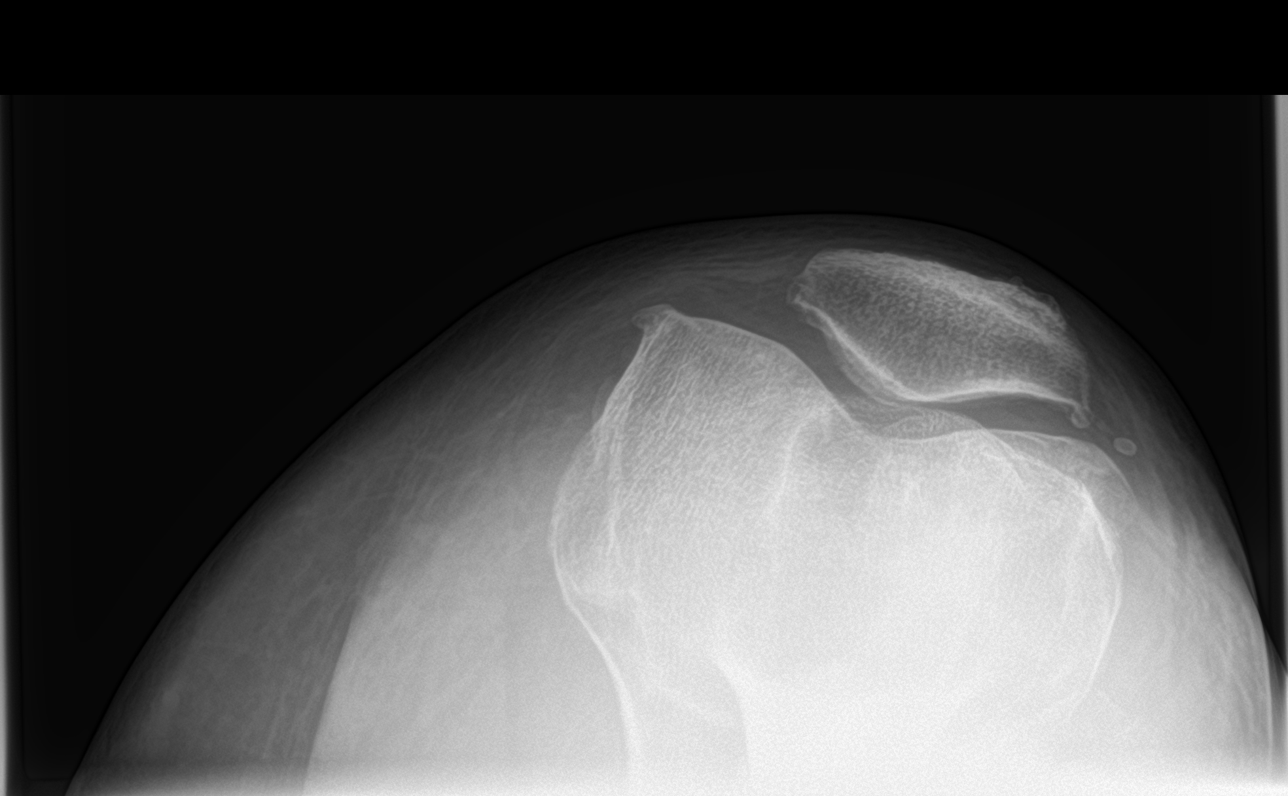

[4 of 4 positions shown; findings below may reference images not displayed]

FINDINGS: Standing frontal, standing tunnel, standing lateral, and sunrise
patellar images were obtained. There is no evident fracture or
dislocation. There is a small joint effusion. There is moderately
severe narrowing medially. There is moderate patellofemoral joint
space narrowing. There is spurring in all compartments. No erosive
change.
IMPRESSION: Osteoarthritic change, most markedly medially and to a somewhat
lesser extent in the patellofemoral joint. Small joint effusion. No
fracture or dislocation.
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
## 2017-01-08 MED ORDER — TRIAMTERENE-HCTZ 37.5-25 MG PO TABS: 1.0000 | ORAL_TABLET | Freq: Every day | ORAL | 3 refills | Status: DC

## 2017-01-08 MED ORDER — GABAPENTIN 300 MG PO CAPS: 600.0000 mg | ORAL_CAPSULE | Freq: Four times a day (QID) | ORAL | 1 refills | Status: DC

## 2017-01-08 NOTE — Progress Notes (Signed)
   Subjective:    Patient ID: Susan [name], female    DOB: [birth year], 56 y.o.   MRN: [id number]  HPI The patient is a 56 YO female coming in for follow up of her medical problems including her diabetes (overdue to see endo, diet is about the same, most recent sugar reading 240, cannot recall more readings, worsening neuropathy in her hands and feet since last visit, taking insulins dosing through endo), and her arthritis in her knees (she is having worsening pain in her knees, has never tried injections, past hx heroin abuse clean for 8 years and does not want to try strong medicines for, uses tylenol and ibuprofen when needed), and her blood pressure (elevated readings at most of her recent visits, taking enalapril and clonidine and amlodipine, denies headaches or chest pains, some malaise, no side effects with meds currently, having more swelling in her ankles).   Review of Systems  Constitutional: Positive for activity change and fatigue. Negative for appetite change, chills, fever and unexpected weight change.  HENT: Negative.   Eyes: Negative.   Respiratory: Positive for cough. Negative for chest tightness, shortness of breath and wheezing.   Cardiovascular: Positive for leg swelling. Negative for chest pain and palpitations.  Gastrointestinal: Negative for abdominal distention, abdominal pain, diarrhea, nausea and vomiting.  Musculoskeletal: Positive for arthralgias and gait problem. Negative for back pain, joint swelling and myalgias.  Skin: Negative.   Neurological: Positive for numbness. Negative for dizziness, facial asymmetry, weakness, light-headedness and headaches.  Psychiatric/Behavioral: Negative.       Objective:   Physical Exam  Constitutional: She is oriented to person, place, and time. She appears well-developed and well-nourished.  Obese  HENT:  Head: Normocephalic and atraumatic.  Eyes: EOM are normal.  Neck: Normal range of motion.  Cardiovascular: Normal rate and  regular rhythm.   Pulmonary/Chest: Effort normal and breath sounds normal. No respiratory distress. She has no wheezes. She has no rales.  Some expiratory wheezing at the bases  Abdominal: Soft. Bowel sounds are normal. She exhibits no distension. There is no tenderness. There is no rebound.  Musculoskeletal: She exhibits tenderness. She exhibits no edema.  Neuropathy in the feet, tenderness in the knees  Neurological: She is alert and oriented to person, place, and time. Coordination normal.  Slow gait  Skin: Skin is warm and dry.  Psychiatric: She has a normal mood and affect.   Vitals:   01/08/17 1051 01/08/17 1118  BP: (!) 160/80 (!) 160/82  Pulse: (!) 115   Resp: 16   Temp: 98.3 F (36.8 C)   TempSrc: Oral   SpO2: 99%   Weight: 276 lb (125.2 kg)   Height: 5\' 3"  (1.6 m)      Assessment & Plan:

## 2017-01-08 NOTE — Assessment & Plan Note (Signed)
Checking x-ray of the knees for arthritis and severity. She has voltaren gel to use and talked to her about sports medicine for knee injections and she will strongly consider.

## 2017-01-08 NOTE — Assessment & Plan Note (Signed)
Needs increased therapy for persistently elevated readings. Will add hctz 25 mg daily to her amlodipine and clinidine and enalapril. Will need close monitoring in 1-2 months.

## 2017-01-08 NOTE — Patient Instructions (Signed)
We have sent in the gabapentin higher amount so you can take it up to 4 times per day if needed.  We are checking the x-ray of the knees today to see if there is arthritis. If there is you likely would be helped with injections in the knees and you can do that.   We have sent in a fluid pill to take daily to help with the swelling called hctz. Take 1 pill daily.

## 2017-01-08 NOTE — Progress Notes (Signed)
Pre visit review using our clinic review tool, if applicable. No additional management support is needed unless otherwise documented below in the visit note. 

## 2017-01-08 NOTE — Assessment & Plan Note (Signed)
Has progressive neuropathy and increasing gabapentin to QID prn for pain for better control. Was not able to tolerate lyrica and gabapentin provides some relief. Checking HgA1c and reminded her of the importance of diet (drinking fruit juice during the visit today) to help decrease neuropathy pain with good sugar control.

## 2017-01-09 ENCOUNTER — Telehealth: Payer: Self-pay | Admitting: Internal Medicine

## 2017-01-09 NOTE — Telephone Encounter (Signed)
Please call back with lab results

## 2017-01-09 NOTE — Telephone Encounter (Signed)
Patient contacted

## 2017-01-14 ENCOUNTER — Other Ambulatory Visit: Payer: Self-pay | Admitting: Internal Medicine

## 2017-01-14 MED ORDER — ROSUVASTATIN CALCIUM 20 MG PO TABS: 20.0000 mg | ORAL_TABLET | Freq: Every day | ORAL | 3 refills | Status: DC

## 2017-01-14 MED ORDER — METFORMIN HCL 500 MG PO TABS: 500.0000 mg | ORAL_TABLET | Freq: Two times a day (BID) | ORAL | 3 refills | Status: DC

## 2017-01-22 ENCOUNTER — Encounter (HOSPITAL_COMMUNITY): Payer: Self-pay | Admitting: Emergency Medicine

## 2017-01-22 ENCOUNTER — Emergency Department (HOSPITAL_COMMUNITY)
Admission: EM | Admit: 2017-01-22 | Discharge: 2017-01-23 | Disposition: A | Discharge status: Other Acute Inpatient Hospital | Payer: Medicare Other | Attending: Emergency Medicine | Admitting: Emergency Medicine

## 2017-01-22 DIAGNOSIS — Z8673 Personal history of transient ischemic attack (TIA), and cerebral infarction without residual deficits: Secondary | ICD-10-CM | POA: Insufficient documentation

## 2017-01-22 DIAGNOSIS — F331 Major depressive disorder, recurrent, moderate: Secondary | ICD-10-CM | POA: Diagnosis present

## 2017-01-22 DIAGNOSIS — Z813 Family history of other psychoactive substance abuse and dependence: Secondary | ICD-10-CM | POA: Diagnosis not present

## 2017-01-22 DIAGNOSIS — Z794 Long term (current) use of insulin: Secondary | ICD-10-CM | POA: Diagnosis not present

## 2017-01-22 DIAGNOSIS — E119 Type 2 diabetes mellitus without complications: Secondary | ICD-10-CM | POA: Insufficient documentation

## 2017-01-22 DIAGNOSIS — Z79899 Other long term (current) drug therapy: Secondary | ICD-10-CM | POA: Diagnosis not present

## 2017-01-22 DIAGNOSIS — R45850 Homicidal ideations: Secondary | ICD-10-CM | POA: Diagnosis not present

## 2017-01-22 DIAGNOSIS — F332 Major depressive disorder, recurrent severe without psychotic features: Secondary | ICD-10-CM | POA: Diagnosis not present

## 2017-01-22 DIAGNOSIS — J45909 Unspecified asthma, uncomplicated: Secondary | ICD-10-CM | POA: Insufficient documentation

## 2017-01-22 DIAGNOSIS — I10 Essential (primary) hypertension: Secondary | ICD-10-CM | POA: Diagnosis not present

## 2017-01-22 DIAGNOSIS — F17210 Nicotine dependence, cigarettes, uncomplicated: Secondary | ICD-10-CM | POA: Insufficient documentation

## 2017-01-22 DIAGNOSIS — R45851 Suicidal ideations: Secondary | ICD-10-CM

## 2017-01-22 DIAGNOSIS — Z818 Family history of other mental and behavioral disorders: Secondary | ICD-10-CM | POA: Diagnosis not present

## 2017-01-22 HISTORY — DX: Schizophrenia, unspecified: F20.9

## 2017-01-22 LAB — CBC
HCT: 46.8 % — ABNORMAL HIGH (ref 36.0–46.0)
HEMOGLOBIN: 16.7 g/dL — AB (ref 12.0–15.0)
MCH: 31.3 pg (ref 26.0–34.0)
MCHC: 35.7 g/dL (ref 30.0–36.0)
MCV: 87.6 fL (ref 78.0–100.0)
PLATELETS: 310 10*3/uL (ref 150–400)
RBC: 5.34 MIL/uL — AB (ref 3.87–5.11)
RDW: 13.6 % (ref 11.5–15.5)
WBC: 11 10*3/uL — AB (ref 4.0–10.5)

## 2017-01-22 LAB — COMPREHENSIVE METABOLIC PANEL
ALT: 12 U/L — AB (ref 14–54)
AST: 15 U/L (ref 15–41)
Albumin: 4 g/dL (ref 3.5–5.0)
Alkaline Phosphatase: 133 U/L — ABNORMAL HIGH (ref 38–126)
Anion gap: 10 (ref 5–15)
BUN: 13 mg/dL (ref 6–20)
CHLORIDE: 91 mmol/L — AB (ref 101–111)
CO2: 30 mmol/L (ref 22–32)
CREATININE: 0.81 mg/dL (ref 0.44–1.00)
Calcium: 9.5 mg/dL (ref 8.9–10.3)
GFR calc non Af Amer: >60 mL/min (ref >60)
Glucose, Bld: 307 mg/dL — ABNORMAL HIGH (ref 65–99)
POTASSIUM: 3.4 mmol/L — AB (ref 3.5–5.1)
Sodium: 131 mmol/L — ABNORMAL LOW (ref 135–145)
Total Bilirubin: 0.4 mg/dL (ref 0.3–1.2)
Total Protein: 7.7 g/dL (ref 6.5–8.1)

## 2017-01-22 LAB — RAPID URINE DRUG SCREEN, HOSP PERFORMED
AMPHETAMINES: NOT DETECTED
BENZODIAZEPINES: NOT DETECTED
Barbiturates: NOT DETECTED
COCAINE: NOT DETECTED
OPIATES: NOT DETECTED
TETRAHYDROCANNABINOL: POSITIVE — AB

## 2017-01-22 LAB — CBG MONITORING, ED
Glucose-Capillary: 289 mg/dL — ABNORMAL HIGH (ref 65–99)
Glucose-Capillary: 281 mg/dL — ABNORMAL HIGH (ref 65–99)

## 2017-01-22 LAB — ETHANOL

## 2017-01-22 LAB — SALICYLATE LEVEL

## 2017-01-22 LAB — ACETAMINOPHEN LEVEL

## 2017-01-22 MED ORDER — CLONIDINE HCL 0.1 MG PO TABS
0.2000 mg | ORAL_TABLET | Freq: Two times a day (BID) | ORAL | Status: DC
  Administered 2017-01-22 – 2017-01-23 (×2): 0.2 mg via ORAL
  Filled 2017-01-22 (×2): qty 2

## 2017-01-22 MED ORDER — GLUCOSE BLOOD VI STRP
1.0000 | ORAL_STRIP | Freq: Two times a day (BID) | Status: DC
  Administered 2017-01-22 – 2017-01-23 (×2): 1
  Filled 2017-01-22 (×17): qty 1

## 2017-01-22 MED ORDER — METFORMIN HCL 500 MG PO TABS
500.0000 mg | ORAL_TABLET | Freq: Two times a day (BID) | ORAL | Status: DC
Start: 2017-01-22 — End: 2017-01-22

## 2017-01-22 MED ORDER — INSULIN NPH (HUMAN) (ISOPHANE) 100 UNIT/ML ~~LOC~~ SUSP
80.0000 [IU] | Freq: Every day | SUBCUTANEOUS | Status: DC
  Administered 2017-01-22 – 2017-01-23 (×2): 80 [IU] via SUBCUTANEOUS
  Filled 2017-01-22: qty 10

## 2017-01-22 MED ORDER — ELVITEG-COBIC-EMTRICIT-TENOFAF 150-150-200-10 MG PO TABS
1.0000 | ORAL_TABLET | Freq: Every day | ORAL | Status: DC
  Administered 2017-01-22: 1 via ORAL
  Filled 2017-01-22 (×2): qty 1

## 2017-01-22 MED ORDER — ROSUVASTATIN CALCIUM 20 MG PO TABS
20.0000 mg | ORAL_TABLET | Freq: Every day | ORAL | Status: DC
  Administered 2017-01-22 – 2017-01-23 (×2): 20 mg via ORAL
  Filled 2017-01-22 (×2): qty 1

## 2017-01-22 MED ORDER — ALBUTEROL SULFATE HFA 108 (90 BASE) MCG/ACT IN AERS: 2.0000 | INHALATION_SPRAY | Freq: Four times a day (QID) | RESPIRATORY_TRACT | Status: DC | PRN

## 2017-01-22 MED ORDER — HYDROXYZINE PAMOATE 25 MG PO CAPS: 25.0000 mg | ORAL_CAPSULE | ORAL | Status: DC | PRN

## 2017-01-22 MED ORDER — TRAMADOL HCL 50 MG PO TABS: 50.0000 mg | ORAL_TABLET | Freq: Two times a day (BID) | ORAL | Status: DC | PRN

## 2017-01-22 MED ORDER — GABAPENTIN 300 MG PO CAPS
600.0000 mg | ORAL_CAPSULE | Freq: Four times a day (QID) | ORAL | Status: DC
Start: 2017-01-22 — End: 2017-01-23
  Administered 2017-01-22 – 2017-01-23 (×4): 600 mg via ORAL
  Filled 2017-01-22 (×4): qty 2

## 2017-01-22 MED ORDER — IPRATROPIUM-ALBUTEROL 0.5-2.5 (3) MG/3ML IN SOLN
3.0000 mL | Freq: Once | RESPIRATORY_TRACT | Status: AC
  Administered 2017-01-22: 3 mL via RESPIRATORY_TRACT
  Filled 2017-01-22: qty 3

## 2017-01-22 MED ORDER — METFORMIN HCL 500 MG PO TABS
500.0000 mg | ORAL_TABLET | Freq: Two times a day (BID) | ORAL | Status: DC
  Administered 2017-01-22 – 2017-01-23 (×2): 500 mg via ORAL
  Filled 2017-01-22 (×2): qty 1

## 2017-01-22 MED ORDER — ENALAPRIL MALEATE 10 MG PO TABS
20.0000 mg | ORAL_TABLET | Freq: Every day | ORAL | Status: DC
  Administered 2017-01-22 – 2017-01-23 (×2): 20 mg via ORAL
  Filled 2017-01-22 (×2): qty 2

## 2017-01-22 MED ORDER — QUETIAPINE FUMARATE 100 MG PO TABS
100.0000 mg | ORAL_TABLET | Freq: Every day | ORAL | Status: DC
  Administered 2017-01-22: 100 mg via ORAL
  Filled 2017-01-22 (×2): qty 1

## 2017-01-22 MED ORDER — HYDROXYZINE HCL 25 MG PO TABS
25.0000 mg | ORAL_TABLET | ORAL | Status: DC | PRN
  Administered 2017-01-22: 25 mg via ORAL
  Filled 2017-01-22: qty 1

## 2017-01-22 MED ORDER — TRIAMTERENE-HCTZ 37.5-25 MG PO TABS
1.0000 | ORAL_TABLET | Freq: Every day | ORAL | Status: DC
  Administered 2017-01-22 – 2017-01-23 (×2): 1 via ORAL
  Filled 2017-01-22 (×2): qty 1

## 2017-01-22 MED ORDER — AMLODIPINE BESYLATE 5 MG PO TABS
10.0000 mg | ORAL_TABLET | Freq: Every day | ORAL | Status: DC
  Administered 2017-01-22 – 2017-01-23 (×2): 10 mg via ORAL
  Filled 2017-01-22 (×2): qty 2
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
## 2017-01-22 NOTE — Progress Notes (Signed)
Patient states "I can not pee right now". Will continue to monitor.

## 2017-01-22 NOTE — ED Notes (Signed)
Bed: WA26 Expected date:  Expected time:  Means of arrival:  Comments: EMS-SI 

## 2017-01-22 NOTE — ED Triage Notes (Signed)
Patient came in by EMS with complaints of feeling suicidal.  Patient has a history of schizophrenia.  Patient reports she has not been eating or sleeping.  Patient has been having trouble controlling diabetes.

## 2017-01-22 NOTE — ED Notes (Signed)
Pt given specimen cup and asked to provide urine per MD order.
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
## 2017-01-22 NOTE — ED Notes (Signed)
Pt admitted to room #40. Pt tearful on approach, sad affect. Pt endorsing SI/HI/AH. Encouragement and support provided. Special checks q 15 mins in place for safety, Video monitoring in place. Will continue to monitor.

## 2017-01-23 ENCOUNTER — Inpatient Hospital Stay (HOSPITAL_COMMUNITY)
Admission: AD | Admit: 2017-01-23 | Discharge: 2017-01-29 | DRG: 885 | Disposition: A | Discharge status: Home / Self Care | Payer: Medicare Other | Source: Intra-hospital | Attending: Psychiatry | Admitting: Psychiatry

## 2017-01-23 ENCOUNTER — Encounter (HOSPITAL_COMMUNITY): Payer: Self-pay | Admitting: *Deleted

## 2017-01-23 DIAGNOSIS — Z79899 Other long term (current) drug therapy: Secondary | ICD-10-CM | POA: Diagnosis not present

## 2017-01-23 DIAGNOSIS — Z818 Family history of other mental and behavioral disorders: Secondary | ICD-10-CM | POA: Diagnosis not present

## 2017-01-23 DIAGNOSIS — Z8673 Personal history of transient ischemic attack (TIA), and cerebral infarction without residual deficits: Secondary | ICD-10-CM | POA: Diagnosis not present

## 2017-01-23 DIAGNOSIS — F331 Major depressive disorder, recurrent, moderate: Secondary | ICD-10-CM | POA: Diagnosis present

## 2017-01-23 DIAGNOSIS — F17210 Nicotine dependence, cigarettes, uncomplicated: Secondary | ICD-10-CM | POA: Diagnosis present

## 2017-01-23 DIAGNOSIS — F332 Major depressive disorder, recurrent severe without psychotic features: Secondary | ICD-10-CM

## 2017-01-23 DIAGNOSIS — F333 Major depressive disorder, recurrent, severe with psychotic symptoms: Secondary | ICD-10-CM | POA: Diagnosis present

## 2017-01-23 DIAGNOSIS — G4700 Insomnia, unspecified: Secondary | ICD-10-CM | POA: Diagnosis not present

## 2017-01-23 DIAGNOSIS — F329 Major depressive disorder, single episode, unspecified: Secondary | ICD-10-CM | POA: Diagnosis present

## 2017-01-23 DIAGNOSIS — I10 Essential (primary) hypertension: Secondary | ICD-10-CM | POA: Diagnosis present

## 2017-01-23 DIAGNOSIS — F17213 Nicotine dependence, cigarettes, with withdrawal: Secondary | ICD-10-CM | POA: Diagnosis not present

## 2017-01-23 DIAGNOSIS — E119 Type 2 diabetes mellitus without complications: Secondary | ICD-10-CM | POA: Diagnosis not present

## 2017-01-23 DIAGNOSIS — Z7984 Long term (current) use of oral hypoglycemic drugs: Secondary | ICD-10-CM

## 2017-01-23 DIAGNOSIS — B20 Human immunodeficiency virus [HIV] disease: Secondary | ICD-10-CM | POA: Diagnosis present

## 2017-01-23 DIAGNOSIS — F4310 Post-traumatic stress disorder, unspecified: Secondary | ICD-10-CM | POA: Diagnosis present

## 2017-01-23 DIAGNOSIS — F419 Anxiety disorder, unspecified: Secondary | ICD-10-CM | POA: Diagnosis not present

## 2017-01-23 DIAGNOSIS — K219 Gastro-esophageal reflux disease without esophagitis: Secondary | ICD-10-CM | POA: Diagnosis present

## 2017-01-23 DIAGNOSIS — Z813 Family history of other psychoactive substance abuse and dependence: Secondary | ICD-10-CM | POA: Diagnosis not present

## 2017-01-23 DIAGNOSIS — F339 Major depressive disorder, recurrent, unspecified: Secondary | ICD-10-CM | POA: Diagnosis not present

## 2017-01-23 DIAGNOSIS — R45851 Suicidal ideations: Secondary | ICD-10-CM

## 2017-01-23 DIAGNOSIS — Z794 Long term (current) use of insulin: Secondary | ICD-10-CM | POA: Diagnosis not present

## 2017-01-23 DIAGNOSIS — J45909 Unspecified asthma, uncomplicated: Secondary | ICD-10-CM | POA: Diagnosis not present

## 2017-01-23 DIAGNOSIS — F29 Unspecified psychosis not due to a substance or known physiological condition: Secondary | ICD-10-CM | POA: Diagnosis not present

## 2017-01-23 DIAGNOSIS — F39 Unspecified mood [affective] disorder: Secondary | ICD-10-CM | POA: Diagnosis not present

## 2017-01-23 DIAGNOSIS — F1290 Cannabis use, unspecified, uncomplicated: Secondary | ICD-10-CM

## 2017-01-23 DIAGNOSIS — R451 Restlessness and agitation: Secondary | ICD-10-CM | POA: Diagnosis not present

## 2017-01-23 LAB — GLUCOSE, CAPILLARY
GLUCOSE-CAPILLARY: 197 mg/dL — AB (ref 65–99)
GLUCOSE-CAPILLARY: 242 mg/dL — AB (ref 65–99)
Glucose-Capillary: 189 mg/dL — ABNORMAL HIGH (ref 65–99)

## 2017-01-23 LAB — CBG MONITORING, ED: GLUCOSE-CAPILLARY: 137 mg/dL — AB (ref 65–99)

## 2017-01-23 MED ORDER — HYDROXYZINE HCL 50 MG PO TABS: 50.0000 mg | ORAL_TABLET | ORAL | Status: DC | PRN

## 2017-01-23 MED ORDER — HYDROXYZINE HCL 25 MG PO TABS
25.0000 mg | ORAL_TABLET | ORAL | Status: DC | PRN
  Filled 2017-01-23: qty 1

## 2017-01-23 MED ORDER — METFORMIN HCL 500 MG PO TABS
500.0000 mg | ORAL_TABLET | Freq: Two times a day (BID) | ORAL | Status: DC
  Administered 2017-01-23 – 2017-01-29 (×12): 500 mg via ORAL
  Filled 2017-01-23 (×17): qty 1

## 2017-01-23 MED ORDER — INSULIN NPH (HUMAN) (ISOPHANE) 100 UNIT/ML ~~LOC~~ SUSP
80.0000 [IU] | Freq: Every day | SUBCUTANEOUS | Status: DC
  Administered 2017-01-24 – 2017-01-29 (×6): 80 [IU] via SUBCUTANEOUS
  Filled 2017-01-23: qty 0.8

## 2017-01-23 MED ORDER — ALUM & MAG HYDROXIDE-SIMETH 200-200-20 MG/5ML PO SUSP: 30.0000 mL | ORAL | Status: DC | PRN

## 2017-01-23 MED ORDER — ENALAPRIL MALEATE 20 MG PO TABS
20.0000 mg | ORAL_TABLET | Freq: Every day | ORAL | Status: DC
  Administered 2017-01-24 – 2017-01-28 (×4): 20 mg via ORAL
  Filled 2017-01-23 (×2): qty 1
  Filled 2017-01-23 (×2): qty 4
  Filled 2017-01-23 (×6): qty 1

## 2017-01-23 MED ORDER — MAGNESIUM HYDROXIDE 400 MG/5ML PO SUSP: 30.0000 mL | Freq: Every day | ORAL | Status: DC | PRN

## 2017-01-23 MED ORDER — QUETIAPINE FUMARATE 100 MG PO TABS
200.0000 mg | ORAL_TABLET | Freq: Every day | ORAL | Status: DC
  Administered 2017-01-23: 200 mg via ORAL
  Filled 2017-01-23: qty 2

## 2017-01-23 MED ORDER — ALBUTEROL SULFATE HFA 108 (90 BASE) MCG/ACT IN AERS: 2.0000 | INHALATION_SPRAY | Freq: Four times a day (QID) | RESPIRATORY_TRACT | Status: DC | PRN

## 2017-01-23 MED ORDER — GLUCOSE BLOOD VI STRP: 1.0000 | ORAL_STRIP | Freq: Two times a day (BID) | Status: DC

## 2017-01-23 MED ORDER — QUETIAPINE FUMARATE 200 MG PO TABS
200.0000 mg | ORAL_TABLET | Freq: Every day | ORAL | Status: DC
  Administered 2017-01-24: 200 mg via ORAL
  Filled 2017-01-23 (×3): qty 1

## 2017-01-23 MED ORDER — AMLODIPINE BESYLATE 10 MG PO TABS
10.0000 mg | ORAL_TABLET | Freq: Every day | ORAL | Status: DC
  Administered 2017-01-24 – 2017-01-28 (×5): 10 mg via ORAL
  Filled 2017-01-23 (×8): qty 1

## 2017-01-23 MED ORDER — LORAZEPAM 0.5 MG PO TABS
0.5000 mg | ORAL_TABLET | Freq: Four times a day (QID) | ORAL | Status: DC | PRN
  Administered 2017-01-25 – 2017-01-26 (×2): 0.5 mg via ORAL
  Filled 2017-01-23 (×2): qty 1

## 2017-01-23 MED ORDER — ELVITEG-COBIC-EMTRICIT-TENOFAF 150-150-200-10 MG PO TABS
1.0000 | ORAL_TABLET | Freq: Every day | ORAL | Status: DC
  Filled 2017-01-23 (×2): qty 1

## 2017-01-23 MED ORDER — GABAPENTIN 300 MG PO CAPS
600.0000 mg | ORAL_CAPSULE | Freq: Four times a day (QID) | ORAL | Status: DC
  Administered 2017-01-23 – 2017-01-29 (×23): 600 mg via ORAL
  Filled 2017-01-23 (×34): qty 2

## 2017-01-23 MED ORDER — ACETAMINOPHEN 325 MG PO TABS: 650.0000 mg | ORAL_TABLET | Freq: Four times a day (QID) | ORAL | Status: DC | PRN

## 2017-01-23 MED ORDER — ROSUVASTATIN CALCIUM 20 MG PO TABS
20.0000 mg | ORAL_TABLET | Freq: Every day | ORAL | Status: DC
  Administered 2017-01-24 – 2017-01-29 (×6): 20 mg via ORAL
  Filled 2017-01-23 (×8): qty 1

## 2017-01-23 MED ORDER — TRIAMTERENE-HCTZ 37.5-25 MG PO TABS
1.0000 | ORAL_TABLET | Freq: Every day | ORAL | Status: DC
  Administered 2017-01-24 – 2017-01-29 (×6): 1 via ORAL
  Filled 2017-01-23 (×8): qty 1

## 2017-01-23 MED ORDER — CLONIDINE HCL 0.2 MG PO TABS
0.2000 mg | ORAL_TABLET | Freq: Two times a day (BID) | ORAL | Status: DC
Start: 2017-01-23 — End: 2017-01-29
  Administered 2017-01-24 – 2017-01-28 (×8): 0.2 mg via ORAL
  Filled 2017-01-23 (×7): qty 1
  Filled 2017-01-23: qty 2
  Filled 2017-01-23 (×9): qty 1
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
## 2017-01-23 NOTE — Plan of Care (Signed)
Problem: Safety: Goal: Periods of time without injury will increase Outcome: Progressing Patient contracts for safety on the unit. Patient is on q15 minute safety checks and free of injury.

## 2017-01-23 NOTE — ED Notes (Signed)
Attempted to call report to [address] hall again; Informed by Lavella Lemons MHT, that Linna Hoff will return the call to get report.

## 2017-01-23 NOTE — ED Notes (Signed)
Attempted to call report. No answer on 500 hall.

## 2017-01-23 NOTE — Progress Notes (Signed)
Nursing Progress Note [id number]  D) Patient presents flat and depressed. Patient is isolative to room with no initiation to staff. Patient did not attend group. Patient encouraged to come take medications but patient states "I just started to relax and I don't want to be bothered". Patient endorsed passive SI and auditory hallucinations. Patient denied HI or pain. Patient contracts for safety on the unit.   A)  Emotional support given. 1:1 interaction and active listening provided. No medications administered this evening. Opportunity for snacks and fluids provided. Opportunities for questions or concerns presented to patient. Patient encouraged to continue to work on treatment goals. Labs, vital signs and patient behavior monitored throughout shift. Patient safety maintained with q15 min safety checks. High fall risk precautions in place and reviewed with patient; patient verbalized understanding.  R) Patient receptive to interaction with nurse. Patient remains safe on the unit at this time. Patient denies any adverse medication reactions at this time. Patient is resting in bed without complaints. Will continue to monitor.

## 2017-01-23 NOTE — ED Notes (Signed)
Introduced self to patient. Pt oriented to unit expectations.  Assessed pt for:  A) Anxiety &/or agitation: Pt was anxious this morning because she takes 200 mg of Seroquel and 100 mg was all that was ordered. She spoke with Dr. Darleene Cleaver and he agreed to give her 200 mg. She has been calm and cooperative.   S) Safety: Safety maintained with q-15-minute checks and hourly rounds by staff.  A) ADLs: Pt able to perform ADLs independently.  P) Pick-Up (room cleanliness): Pt's room clean and free of clutter.

## 2017-01-23 NOTE — ED Notes (Signed)
Called [employer].
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
## 2017-01-23 NOTE — ED Notes (Signed)
Pt transported to [facility] by [employer] [facility]. All belongings returned to pt who signed for same.

## 2017-01-23 NOTE — Progress Notes (Signed)
Inpatient Diabetes Program Recommendations  AACE/ADA: New Consensus Statement on Inpatient Glycemic Control (2015)  Target Ranges:  Prepandial:   less than 140 mg/dL      Peak postprandial:   less than 180 mg/dL (1-2 hours)      Critically ill patients:  140 - 180 mg/dL   Results for Susan [name], Susan [name] (MRN [id number]) as of 01/23/2017 13:47  Ref. Range 01/22/2017 17:09 01/22/2017 21:12 01/23/2017 07:28  Glucose-Capillary Latest Ref Range: 65 - 99 mg/dL 289 (H) 281 (H) 137 (H)    Admit with: MDD (major depressive disorder), recurrent, severe, with psychosis (HCC)  History: DM, Schizophrenia  Home DM Meds: NPH Insulin 80 units daily       Metformin 500 mg BID  Current Insulin Orders: NPH Insulin 80 units daily                 Metformin 500 mg BID    MD- Please consider the following in-hospital insulin adjustments:  1. Continue NPH Insulin and Metformin  2. Start Novolog Moderate Correction Scale/ SSI (0-15 units) TID AC + HS      --Will follow patient during hospitalization--  Wyn Quaker RN, MSN, CDE Diabetes Coordinator Inpatient Glycemic Control Team Team Pager: [phone] (8a-5p)

## 2017-01-23 NOTE — Tx Team (Signed)
Initial Treatment Plan 01/23/2017 5:07 PM Susan [name] TXH:[id number]    PATIENT STRESSORS: Medication change or noncompliance   PATIENT STRENGTHS: Ability for insight Communication skills Motivation for treatment/growth Supportive family/friends   PATIENT IDENTIFIED PROBLEMS: At risk for suicide  Psychosis  "Be stronger"  "Feel better"  "get meds right"             DISCHARGE CRITERIA:  Ability to meet basic life and health needs Adequate post-discharge living arrangements Improved stabilization in mood, thinking, and/or behavior Motivation to continue treatment in a less acute level of care Need for constant or close observation no longer present Verbal commitment to aftercare and medication compliance  PRELIMINARY DISCHARGE PLAN: Outpatient therapy Return to previous living arrangement  PATIENT/FAMILY INVOLVEMENT: This treatment plan has been presented to and reviewed with the patient, Susan [name].  The patient and family have been given the opportunity to ask questions and make suggestions.  Dustin Flock, RN 01/23/2017, 5:07 PM

## 2017-01-23 NOTE — BH Assessment (Signed)
[facility] Assessment Progress Note  Per Corena Pilgrim, MD, this pt requires psychiatric hospitalization at this time.  Letitia Libra, RN, [facility] has assigned pt to [facility] Rm [id number]; they will be ready to receive pt at 13:00.  Pt has signed Voluntary Admission and Consent for Treatment, as well as Consent to Release Information to [facility], her outpatient provider, and a notification call has been placed.  Signed forms have been faxed to [facility].  Pt's nurse, Diane, has been notified, and agrees to send original paperwork along with pt via [facility], and to call report to [phone].  Jalene Mullet, [place] Triage Specialist [phone]

## 2017-01-23 NOTE — Progress Notes (Signed)
01/23/17 1351:  LRT went to pt room to offer activities, pt was sleep.  Victorino Sparrow, LRT/CTRS

## 2017-01-24 DIAGNOSIS — Z813 Family history of other psychoactive substance abuse and dependence: Secondary | ICD-10-CM

## 2017-01-24 DIAGNOSIS — F333 Major depressive disorder, recurrent, severe with psychotic symptoms: Principal | ICD-10-CM

## 2017-01-24 DIAGNOSIS — Z818 Family history of other mental and behavioral disorders: Secondary | ICD-10-CM

## 2017-01-24 LAB — GLUCOSE, CAPILLARY
Glucose-Capillary: 127 mg/dL — ABNORMAL HIGH (ref 65–99)
Glucose-Capillary: 119 mg/dL — ABNORMAL HIGH (ref 65–99)
Glucose-Capillary: 237 mg/dL — ABNORMAL HIGH (ref 65–99)
Glucose-Capillary: 134 mg/dL — ABNORMAL HIGH (ref 65–99)

## 2017-01-24 MED ORDER — QUETIAPINE FUMARATE 50 MG PO TABS
50.0000 mg | ORAL_TABLET | Freq: Once | ORAL | Status: AC
  Administered 2017-01-24: 50 mg via ORAL
  Filled 2017-01-24 (×2): qty 1

## 2017-01-24 MED ORDER — ELVITEG-COBIC-EMTRICIT-TENOFAF 150-150-200-10 MG PO TABS
1.0000 | ORAL_TABLET | Freq: Every day | ORAL | Status: DC
  Administered 2017-01-24 – 2017-01-28 (×5): 1 via ORAL
  Filled 2017-01-24 (×6): qty 1

## 2017-01-24 MED ORDER — SERTRALINE HCL 25 MG PO TABS
25.0000 mg | ORAL_TABLET | Freq: Every day | ORAL | Status: DC
  Administered 2017-01-24 – 2017-01-28 (×5): 25 mg via ORAL
  Filled 2017-01-24 (×8): qty 1

## 2017-01-24 MED ORDER — QUETIAPINE FUMARATE 200 MG PO TABS
200.0000 mg | ORAL_TABLET | Freq: Two times a day (BID) | ORAL | Status: DC
  Administered 2017-01-24 – 2017-01-25 (×2): 200 mg via ORAL
  Filled 2017-01-24 (×6): qty 1
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
## 2017-01-24 NOTE — Progress Notes (Signed)
Adult Psychoeducational Group Note  Date:  01/24/2017 Time:  11:11 AM  Group Topic/Focus:  Making Healthy Choices:   The focus of this group is to help patients identify negative/unhealthy choices they were using prior to admission and identify positive/healthier coping strategies to replace them upon discharge.  Participation Level:  Active  Participation Quality:  Appropriate, Attentive, Sharing and Supportive  Affect:  Appropriate  Cognitive:  Alert and Appropriate  Insight: Appropriate and Good  Engagement in Group:  Engaged  Modes of Intervention:  Discussion, Education and Exploration  Additional Comments:  Patient is attentive and receptive.  States goal is to see the doctor and discuss medication and plan of care.  Mart Piggs 01/24/2017, 11:11 AM

## 2017-01-24 NOTE — Plan of Care (Signed)
Problem: Health Behavior/Discharge Planning: Goal: Compliance with prescribed medication regimen will improve Outcome: Progressing Patient is compliant with medication regimen.

## 2017-01-24 NOTE — BHH Group Notes (Signed)
[facility] Association Group Therapy  01/24/2017 , 5:28 PM    Type of Therapy:  Mental Health Association Presentation  Participation Level:  Active  Participation Quality:  Attentive  Affect:  Blunted  Cognitive:  Oriented  Insight:  Limited  Engagement in Therapy:  Engaged  Modes of Intervention:  Discussion, Education and Socialization  Summary of Progress/Problems:  Shanon Brow from [facility] came to present his recovery story and play the guitar.  Stayed the entire time, engaged throughout.  Engaged the presenter in conversatiion several times, and expressed appreciation for her presence.  Roque Lias B 01/24/2017 , 5:28 PM

## 2017-01-24 NOTE — Tx Team (Signed)
Interdisciplinary Treatment and Diagnostic Plan Update  01/24/2017 Time of Session: 11:11 AM  Susan [name] MRN: [id number]  Principal Diagnosis: Bipolar D/O, depressed  Secondary Diagnoses: Active Problems:   MDD (major depressive disorder)   Current Medications:  Current Facility-Administered Medications  Medication Dose Route Frequency Provider Last Rate Last Dose  . acetaminophen (TYLENOL) tablet 650 mg  650 mg Oral Q6H PRN Kerrie Buffalo, NP      . albuterol (PROVENTIL HFA;VENTOLIN HFA) 108 (90 Base) MCG/ACT inhaler 2 puff  2 puff Inhalation Q6H PRN Patrecia Pour, NP      . alum & mag hydroxide-simeth (MAALOX/MYLANTA) 200-200-20 MG/5ML suspension 30 mL  30 mL Oral Q4H PRN Kerrie Buffalo, NP      . amLODipine (NORVASC) tablet 10 mg  10 mg Oral Daily Patrecia Pour, NP   10 mg at 01/24/17 9562  . cloNIDine (CATAPRES) tablet 0.2 mg  0.2 mg Oral BID Patrecia Pour, NP   0.2 mg at 01/24/17 1308  . elvitegravir-cobicistat-emtricitabine-tenofovir (GENVOYA) 150-150-200-10 MG tablet 1 tablet  1 tablet Oral Daily Waylan Boga Y, NP      . enalapril (VASOTEC) tablet 20 mg  20 mg Oral Daily Patrecia Pour, NP   20 mg at 01/24/17 6578  . gabapentin (NEURONTIN) capsule 600 mg  600 mg Oral QID Patrecia Pour, NP   600 mg at 01/24/17 4696  . hydrOXYzine (ATARAX/VISTARIL) tablet 50 mg  50 mg Oral Q4H PRN Kerrie Buffalo, NP      . insulin NPH Human (HUMULIN N,NOVOLIN N) injection 80 Units  80 Units Subcutaneous QAC breakfast Patrecia Pour, NP   80 Units at 01/24/17 2952  . LORazepam (ATIVAN) tablet 0.5 mg  0.5 mg Oral Q6H PRN Kerrie Buffalo, NP      . magnesium hydroxide (MILK OF MAGNESIA) suspension 30 mL  30 mL Oral Daily PRN Kerrie Buffalo, NP      . metFORMIN (GLUCOPHAGE) tablet 500 mg  500 mg Oral BID WC Patrecia Pour, NP   500 mg at 01/24/17 8413  . QUEtiapine (SEROQUEL) tablet 200 mg  200 mg Oral Daily Patrecia Pour, NP   200 mg at 01/24/17 2440  . QUEtiapine (SEROQUEL)  tablet 200 mg  200 mg Oral BID Cobos, Fernando A, MD      . rosuvastatin (CRESTOR) tablet 20 mg  20 mg Oral Daily Patrecia Pour, NP   20 mg at 01/24/17 1027  . sertraline (ZOLOFT) tablet 25 mg  25 mg Oral Daily Cobos, Myer Peer, MD      . triamterene-hydrochlorothiazide (MAXZIDE-25) 37.5-25 MG per tablet 1 tablet  1 tablet Oral Daily Patrecia Pour, NP   1 tablet at 01/24/17 2536    PTA Medications: Prescriptions Prior to Admission  Medication Sig Dispense Refill Last Dose  . albuterol (PROVENTIL HFA;VENTOLIN HFA) 108 (90 Base) MCG/ACT inhaler Inhale 2 puffs into the lungs every 6 (six) hours as needed for wheezing or shortness of breath. 1 Inhaler 2 Past Month at Unknown time  . amLODipine (NORVASC) 10 MG tablet Take 1 tablet (10 mg total) by mouth daily. 90 tablet 3 01/21/2017 at Unknown time  . B-D ULTRAFINE III SHORT PEN 31G X 8 MM MISC USE AS DIRECTED TWICE DAILY 100 each 0 Taking  . beclomethasone (BECONASE-AQ) 42 MCG/SPRAY nasal spray Place 2 sprays into both nostrils 2 (two) times daily. Dose is for each nostril. 25 g 12 Past Month at Unknown time  . cloNIDine (CATAPRES)  0.2 MG tablet Take 1 tablet (0.2 mg total) by mouth 2 (two) times daily. 180 tablet 3 01/21/2017 at Unknown time  . diclofenac sodium (VOLTAREN) 1 % GEL Apply 2 g topically 3 (three) times daily as needed. 300 g 6 Past Week at Unknown time  . enalapril (VASOTEC) 20 MG tablet TAKE 1 TABLET BY MOUTH DAILY 30 tablet 0 01/21/2017 at Unknown time  . gabapentin (NEURONTIN) 300 MG capsule Take 2 capsules (600 mg total) by mouth 4 (four) times daily. 720 capsule 1 01/21/2017 at Unknown time  . GENVOYA 150-150-200-10 MG TABS tablet TAKE 1 TABLET BY MOUTH DAILY 30 tablet 5 01/21/2017 at Unknown time  . glucose blood (ONETOUCH VERIO) test strip 1 each by Other route 2 (two) times daily. And lancets 2/day. 180 each 12 Taking  . hydrOXYzine (VISTARIL) 25 MG capsule take 74m by mouth three times daily  1 01/21/2017 at Unknown time  .  Insulin NPH, Human,, Isophane, (HUMULIN N KWIKPEN) 100 UNIT/ML Kiwkpen Inject 80 Units into the skin every morning. And pen needles 2/day 30 mL 11 01/21/2017 at Unknown time  . metFORMIN (GLUCOPHAGE) 500 MG tablet Take 1 tablet (500 mg total) by mouth 2 (two) times daily with a meal. 180 tablet 3 01/21/2017 at Unknown time  . omeprazole (PRILOSEC) 20 MG capsule Take 1 capsule (20 mg total) by mouth daily. Appointment needed for additional refills 90 capsule 0 01/21/2017 at Unknown time  . QUEtiapine (SEROQUEL) 100 MG tablet Take 1 tablet (100 mg total) by mouth daily. 30 tablet 0 01/21/2017 at Unknown time  . rosuvastatin (CRESTOR) 20 MG tablet Take 1 tablet (20 mg total) by mouth daily. 90 tablet 3 01/21/2017 at Unknown time  . traMADol (ULTRAM) 50 MG tablet Take 1 tablet (50 mg total) by mouth every 12 (twelve) hours as needed. 60 tablet 0 01/21/2017 at Unknown time  . triamterene-hydrochlorothiazide (MAXZIDE-25) 37.5-25 MG tablet Take 1 tablet by mouth daily. 90 tablet 3 01/21/2017 at Unknown time    Treatment Modalities: Medication Management, Group therapy, Case management,  1 to 1 session with clinician, Psychoeducation, Recreational therapy.   Physician Treatment Plan for Primary Diagnosis: <principal problem not specified> Long Term Goal(s): Improvement in symptoms so as ready for discharge  Short Term Goals:    Medication Management: Evaluate patient's response, side effects, and tolerance of medication regimen.  Therapeutic Interventions: 1 to 1 sessions, Unit Group sessions and Medication administration.  Evaluation of Outcomes: Progressing  Physician Treatment Plan for Secondary Diagnosis: Active Problems:   MDD (major depressive disorder)   Long Term Goal(s): Improvement in symptoms so as ready for discharge  Short Term Goals:    Medication Management: Evaluate patient's response, side effects, and tolerance of medication regimen.  Therapeutic Interventions: 1 to 1 sessions,  Unit Group sessions and Medication administration.  Evaluation of Outcomes: Progressing   RN Treatment Plan for Primary Diagnosis: <principal problem not specified> Long Term Goal(s): Knowledge of disease and therapeutic regimen to maintain health will improve  Short Term Goals: Ability to disclose and discuss suicidal ideas and Compliance with prescribed medications will improve  Medication Management: RN will administer medications as ordered by provider, will assess and evaluate patient's response and provide education to patient for prescribed medication. RN will report any adverse and/or side effects to prescribing provider.  Therapeutic Interventions: 1 on 1 counseling sessions, Psychoeducation, Medication administration, Evaluate responses to treatment, Monitor vital signs and CBGs as ordered, Perform/monitor CIWA, COWS, AIMS and Fall Risk screenings as ordered,  Perform wound care treatments as ordered.  Evaluation of Outcomes: Progressing    Recreational Therapy Treatment Plan for Primary Diagnosis: <principal problem not specified> Long Term Goal(s): Patient will participate in recreation therapy treatment in at least 2 group sessions without prompting from LRT  Short Term Goals: Patient will be able to identify at least 5 coping skills for admitting diagnosis by conclusion of recreation therapy treatment  Treatment Modalities: Group and Pet Therapy  Therapeutic Interventions: Psychoeducation  Evaluation of Outcomes: Progressing   LCSW Treatment Plan for Primary Diagnosis: <principal problem not specified> Long Term Goal(s): Safe transition to appropriate next level of care at discharge, Engage patient in therapeutic group addressing interpersonal concerns.  Short Term Goals: Engage patient in aftercare planning with referrals and resources  Therapeutic Interventions: Assess for all discharge needs, 1 to 1 time with Social worker, Explore available resources and support  systems, Assess for adequacy in community support network, Educate family and significant other(s) on suicide prevention, Complete Psychosocial Assessment, Interpersonal group therapy.  Evaluation of Outcomes: Met  Return home, follow up outpt   Progress in Treatment: Attending groups: Yes Participating in groups: Yes Taking medication as prescribed: Yes Toleration medication: Yes, no side effects reported at this time Family/Significant other contact made: No  Patient understands diagnosis: Yes AEB asking for help with all her symptoms Discussing patient identified problems/goals with staff: Yes Medical problems stabilized or resolved: Yes Denies suicidal/homicidal ideation: Yes Issues/concerns per patient self-inventory: None Other: N/A  New problem(s) identified: None identified at this time.   New Short Term/Long Term Goal(s): None identified at this time.   Discharge Plan or Barriers:   Reason for Continuation of Hospitalization: Anxiety Depression Hallucinations Medical Issues Medication stabilization   Estimated Length of Stay: 3-5 days  Attendees: Patient: 01/24/2017  11:11 AM  Physician: Neita Garnet, MD 01/24/2017  11:11 AM  Nursing: Hoy Register, RN 01/24/2017  11:11 AM  RN Care Manager: Lars Pinks, RN 01/24/2017  11:11 AM  Social Worker: Ripley Fraise 01/24/2017  11:11 AM  Recreational Therapist: Victorino Sparrow, LRT/CTRS 01/24/2017  11:11 AM  Other: Norberto Sorenson 01/24/2017  11:11 AM  Other:  01/24/2017  11:11 AM    Scribe for Treatment Team:  Roque Lias LCSW 01/24/2017 11:11 AM

## 2017-01-24 NOTE — Progress Notes (Signed)
Patient ID: Susan [name], female   DOB: April 07, 1961, 56 y.o.   MRN: [id number] D: Client spends most of the evening in her room. Client reports of her day "ok" "feel a little better" "still hear voices, don't act on it, if I need to I'll talk to somebody" A: Writer provided emotional support, encouraged client to report any concerns. Staff will monitor q23min for safety. R: Client is safe on the unit, attended karaoke.

## 2017-01-24 NOTE — Progress Notes (Signed)
DAR NOTE: Patient presents with irritable affect and depressed mood.  Patient appears agitated over her medication regimen.  Described energy level as low and concentration as poor.  Reports anxiety and auditory hallucination.  Reports suicidal thoughts but contracts for safety.  Patient reports poor night sleep.  Patient agitated upon approach.  States she wants to be left alone to sleep.  Medication given as prescribed.  Patient was apologetic for her behavior.  Routine safety checks maintained.  Support and encouragement offered as needed.  No signs of hypoglycemia noted.

## 2017-01-24 NOTE — BHH Suicide Risk Assessment (Signed)
[name] Admission Suicide Risk Assessment   Nursing information obtained from:  Patient Demographic factors:  Unemployed Current Mental Status:  Suicidal ideation indicated by patient, Self-harm thoughts Loss Factors:  NA Historical Factors:  Prior suicide attempts Risk Reduction Factors:  Living with another person, especially a relative, Positive social support  Total Time spent with patient: 45 minutes Principal Problem:  MDD, Recurrent, with Psychotic Features  Diagnosis:   Patient Active Problem List   Diagnosis Date Noted  . MDD (major depressive disorder) [F32.9] 01/23/2017  . Osteoarthritis [M19.90] 07/14/2016  . Abnormal hemoglobin ([place]) [D58.2] 04/18/2016  . Cough [R05] 04/10/2016  . Severe recurrent major depression without psychotic features ([place]) [F33.2] 01/16/2016  . Hot flashes [R23.2] 10/16/2015  . PTSD (post-traumatic stress disorder) [F43.10] 05/12/2015  . MDD (major depressive disorder), recurrent, severe, with psychosis ([place]) [F33.3] 05/11/2015  . Cannabis use disorder, severe, dependence ([place]) [F12.20] 05/11/2015  . Tobacco use disorder [F17.200] 05/11/2015  . Dehydration [E86.0] 04/17/2015  . Diabetes mellitus type 2 without retinopathy ([place]) [E11.9] 01/28/2015  . Screening examination for venereal disease [Z11.3] 06/02/2014  . Hereditary and idiopathic peripheral neuropathy [G60.9] 06/03/2013  . Dysfunctional uterine bleeding [N93.8] 06/04/2012  . Dyslipidemia [E78.5]   . GERD (gastroesophageal reflux disease) [K21.9]   . Morbid obesity ([place]) [E66.01] 04/26/2011  . HIV disease ([place]) [B20] 03/28/2011  . Hypertension [I10] 03/28/2011    Continued Clinical Symptoms:  Alcohol Use Disorder Identification Test Final Score (AUDIT): 0 The "Alcohol Use Disorders Identification Test", Guidelines for Use in Primary Care, Second Edition.  World Pharmacologist [facility]). Score between 0-7:  no or low risk or alcohol related problems. Score between 8-15:  moderate risk  of alcohol related problems. Score between 16-19:  high risk of alcohol related problems. Score 20 or above:  warrants further diagnostic evaluation for alcohol dependence and treatment.   CLINICAL FACTORS:  56 year old female, presents for worsening depression.  Also reports  auditory hallucinations. She states she has been on Seroquel for years, that normally this medication works well for her. She had stopped it a few days prior to admission and at this time wants to restart it .   Psychiatric Specialty Exam: Physical Exam  ROS  Blood pressure 140/85, pulse 97, temperature 98.1 F (36.7 C), temperature source Oral, resp. rate 20, height 5\' 3"  (1.6 m), weight 123.4 kg (272 lb), SpO2 100 %.Body mass index is 48.18 kg/m.   see admit note MSE     COGNITIVE FEATURES THAT CONTRIBUTE TO RISK:  Closed-mindedness and Loss of executive function    SUICIDE RISK:   Moderate:  Frequent suicidal ideation with limited intensity, and duration, some specificity in terms of plans, no associated intent, good self-control, limited dysphoria/symptomatology, some risk factors present, and identifiable protective factors, including available and accessible social support.  PLAN OF CARE: Patient will be admitted to inpatient psychiatric unit for stabilization and safety. Will provide and encourage milieu participation. Provide medication management and maked adjustments as needed.  Will follow daily.    I certify that inpatient services furnished can reasonably be expected to improve the patient's condition.   Jenne Campus, MD 01/24/2017, 2:28 PM

## 2017-01-24 NOTE — Progress Notes (Signed)
Pt attended the evening karaoke group. Pt was engaged and appropriate. Clint Bolder, NT 01/24/17 9:49 PM
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
## 2017-01-25 DIAGNOSIS — R451 Restlessness and agitation: Secondary | ICD-10-CM

## 2017-01-25 DIAGNOSIS — F419 Anxiety disorder, unspecified: Secondary | ICD-10-CM

## 2017-01-25 DIAGNOSIS — F17213 Nicotine dependence, cigarettes, with withdrawal: Secondary | ICD-10-CM

## 2017-01-25 DIAGNOSIS — F39 Unspecified mood [affective] disorder: Secondary | ICD-10-CM

## 2017-01-25 LAB — GLUCOSE, CAPILLARY
GLUCOSE-CAPILLARY: 75 mg/dL (ref 65–99)
GLUCOSE-CAPILLARY: 244 mg/dL — AB (ref 65–99)
Glucose-Capillary: 133 mg/dL — ABNORMAL HIGH (ref 65–99)
Glucose-Capillary: 186 mg/dL — ABNORMAL HIGH (ref 65–99)

## 2017-01-25 MED ORDER — QUETIAPINE FUMARATE 400 MG PO TABS
400.0000 mg | ORAL_TABLET | Freq: Every day | ORAL | Status: DC
  Administered 2017-01-25 – 2017-01-28 (×4): 400 mg via ORAL
  Filled 2017-01-25 (×5): qty 1
  Filled 2017-01-25: qty 2
  Filled 2017-01-25: qty 1

## 2017-01-25 MED ORDER — QUETIAPINE FUMARATE 200 MG PO TABS
200.0000 mg | ORAL_TABLET | Freq: Every day | ORAL | Status: DC
  Administered 2017-01-26 – 2017-01-29 (×4): 200 mg via ORAL
  Filled 2017-01-25 (×2): qty 1
  Filled 2017-01-25: qty 9
  Filled 2017-01-25 (×3): qty 1

## 2017-01-25 NOTE — Progress Notes (Signed)
Recreation Therapy Notes  INPATIENT RECREATION THERAPY ASSESSMENT  Patient Details Name: Susan [name] MRN: [id number] DOB: 07/09/1961 Today's Date: 01/25/2017  Patient Stressors: Other (Comment) (Loneliness, feeling like I'm no able to do what I need to do)  Pt stated she was here for suicidal thoughts and depression.  Coping Skills:   Isolate, Avoidance, Self-Injury, Talking, Music  Personal Challenges: Anger, Communication, Concentration, Decision-Making, Expressing Yourself, Problem-Solving, Self-Esteem/Confidence, Social Interaction, Stress Management, Time Management, Trusting Others  Leisure Interests (2+):  Individual - TV, Music - Listen, Individual - Other (Comment), Social - Family (smoke cigarettes, play with grandchildren)  Awareness of Community Resources:  Yes  Community Resources:  [place], Other (Comment) (THP programs, Environmental health practitioner)  Current Use: Yes  Patient Strengths:  Daughter, grandchildren  Patient Identified Areas of Improvement:  Communication, feelings  Current Recreation Participation:  Never  Patient Goal for Hospitalization:  "To feel better and understand what's going on so I don't come back to hospital"  [place] of Residence:  [place] of Residence:  [place]  Current [place] (including self-harm):  No  Current HI:  No  Consent to Intern Participation: N/A   Susan [name], LRT/CTRS  Ria Comment, Cleon Thoma A 01/25/2017, 1:42 PM

## 2017-01-25 NOTE — [facility] Group Notes (Signed)
[name] LCSW Group Therapy  01/25/2017  1:05 PM  Type of Therapy:  Group therapy  Participation Level:  Active  Participation Quality:  Attentive  Affect:  Flat  Cognitive:  Oriented  Insight:  Limited  Engagement in Therapy:  Limited  Modes of Intervention:  Discussion, Socialization  Summary of Progress/Problems:  Chaplain was here to lead a group on themes of hope and courage. "I'm noticing patterns of not caring for myself.  But I decided to come to the hospital because I knew I need to get back on track again.  I was losing it.  I don't want anyone else to wash or bathe me."  Engaged throughout.  "I want understanding, not only for myself, but for others too.  That understanding thing for me goes a long way." Susan [name] 01/25/2017 1:30 PM

## 2017-01-25 NOTE — Progress Notes (Signed)
Recreation Therapy Notes  Date: 01/25/17 Time: 1000 Location: 500 [address] Dayroom  Group Topic: Communication, Team Building, Problem Solving  Goal Area(s) Addresses:  Patient will effectively work with peer towards shared goal.  Patient will identify skill used to make activity successful.  Patient will identify how skills used during activity can be used to reach post d/c goals.   Behavioral Response: Engaged  Intervention: STEM Activity   Activity: Metallurgist. In teams, patients were asked to build the tallest freestanding tower possible out of 15 pipe cleaners. Systematically resources were removed, for example patient ability to use both hands and patient ability to verbally communicate.    Education: Education officer, community, Dentist.   Education Outcome: Acknowledges education/In group clarification offered/Needs additional education.   Clinical Observations/Feedback: Pt was active and pleasant throughout group.  Pt stated her group used communication and understanding to complete activity.  Pt expressed these skills would help her to communicate with the people in her life.   Victorino Sparrow, LRT/CTRS         Victorino Sparrow A 01/25/2017 11:42 AM

## 2017-01-25 NOTE — Progress Notes (Signed)
D: Pt presents with flat affect and depressed mood. Tearful at intervals during conversation related to her medication regimen. Denies HI, VH and pain. Endorsed +AH and SI without plan. Verbally contracts for safety. Rates her depression, anxiety and hopelessness all 8/10. Reports good appetite "I slept alright". Visible in dayroom for majority of this shift.  A: Emotional support and availability provided to pt. Scheduled and PRN medications administered as prescribed, effects monitored. Dr. Parke Poisson made aware of pt's concerns related to her Seroquel; new order received (see [place]) and pt made aware of changes. Encouraged pt to voice concerns, take care of her ADLs, comply with current treatment regimen including groups. Q 15 minutes safety checks maintained without issues.  R: Pt compliant with medications when offered. Denies adverse drug reactions.   Denies concerns at this time.
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
## 2017-01-25 NOTE — Progress Notes (Signed)
Adult Psychoeducational Group Note  Date:  01/25/2017 Time:  9:02 PM  Group Topic/Focus:  Wrap-Up Group:   The focus of this group is to help patients review their daily goal of treatment and discuss progress on daily workbooks.  Participation Level:  Active  Participation Quality:  Appropriate  Affect:  Appropriate  Cognitive:  Appropriate  Insight: Appropriate  Engagement in Group:  Engaged  Modes of Intervention:  Discussion  Additional Comments:  The patient expressed that she attended groups    Susan [name] 01/25/2017, 9:02 PM

## 2017-01-25 NOTE — Plan of Care (Signed)
Problem: Safety: Goal: Periods of time without injury will increase Outcome: Progressing Periods of time without injury will increase AEB q49min safety checks.

## 2017-01-26 DIAGNOSIS — F17210 Nicotine dependence, cigarettes, uncomplicated: Secondary | ICD-10-CM

## 2017-01-26 DIAGNOSIS — F1290 Cannabis use, unspecified, uncomplicated: Secondary | ICD-10-CM

## 2017-01-26 DIAGNOSIS — F339 Major depressive disorder, recurrent, unspecified: Secondary | ICD-10-CM

## 2017-01-26 LAB — GLUCOSE, CAPILLARY
GLUCOSE-CAPILLARY: 82 mg/dL (ref 65–99)
GLUCOSE-CAPILLARY: 139 mg/dL — AB (ref 65–99)
GLUCOSE-CAPILLARY: 166 mg/dL — AB (ref 65–99)
Glucose-Capillary: 91 mg/dL (ref 65–99)

## 2017-01-26 NOTE — Plan of Care (Signed)
Problem: Safety: Goal: Periods of time without injury will increase Outcome: Progressing Pt safe on the unit at this time   

## 2017-01-26 NOTE — Progress Notes (Signed)
DAR NOTE: Patient presents with flat affect and depressed mood.  Denies pain, auditory and visual hallucinations.  Described energy level as normal and concentration as good.  Rates depression at 6, hopelessness at 6, and anxiety at 6.  Maintained on routine safety checks.  Medications given as prescribed.  Support and encouragement offered as needed.  Attended group and participated.  States goal for today is "getting better."  Patient observed socializing with peers in the dayroom.  Patient more visible in the dayroom.  Offered no complaint.

## 2017-01-26 NOTE — Progress Notes (Signed)
D: Pt flat, isolative withdrawn to self even while in the dayroom. Pt at the time of assessment denied depression, anxiety, pain, SI, HI or AVH; states, "I can tell you that I'm a 100 times better than when I came; I feel just fine." Pt remained calm and cooperative. A: Medications offered as prescribed.  All patient's questions and concerns addressed. Support, encouragement, and safe environment provided.  15-minute safety checks continue. R: Pt was med compliant. Pt attended wrap-up group. Safety checks continue.

## 2017-01-26 NOTE — [facility] Group Notes (Signed)
[facility] Group Notes:  (Clinical Social Work)  01/26/2017  11:15-12:00PM  Summary of Progress/Problems:   Today's process group involved patients discussing their feelings related to being hospitalized, as well as what positive coping techniques they plan to use in order to stay out of the hospital and at home.  The patient expressed a primary feeling about being hospitalized is that she doesn't want to be here, but realizes she needs to.  She stated it is time for her to "grow up, get up, and take care of myself, stop blaming other people."  She also acknowledged that she has taken herself off medications and needs to "take my medicine and stop playing God," consult the doctor.  Type of Therapy:  Group Therapy - Process  Participation Level:  Active  Participation Quality:  Appropriate, Attentive, Sharing and Supportive  Affect:  Appropriate  Cognitive:  Alert and Appropriate  Insight:  Engaged  Engagement in Therapy:  Engaged  Modes of Intervention:  Exploration, Discussion  Selmer Dominion, LCSW 01/26/2017, 12:51 PM

## 2017-01-26 NOTE — Progress Notes (Signed)
D: Pt denies SI/HI/AVH. Pt is pleasant and cooperative. Pt stated she felt fair, but she can see herself getting better.   A: Pt was offered support and encouragement. Pt was given scheduled medications. Pt was encourage to attend groups. Q 15 minute checks were done for safety.   R:Pt attends groups and interacts well with peers and staff. Pt is taking medication. Pt has no complaints.Pt receptive to treatment and safety maintained on unit.
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
## 2017-01-26 NOTE — Progress Notes (Signed)
Adult Psychoeducational Group Note  Date:  01/26/2017 Time:  8:42 PM  Group Topic/Focus:  Wrap-Up Group:   The focus of this group is to help patients review their daily goal of treatment and discuss progress on daily workbooks.  Participation Level:  Active  Participation Quality:  Appropriate  Affect:  Appropriate  Cognitive:  Appropriate  Insight: Appropriate  Engagement in Group:  Engaged  Modes of Intervention:  Discussion  Additional Comments: The patient expressed that she rates today a 7.The patient also said that she learn to work on fixing issue.Nash Shearer 01/26/2017, 8:42 PM

## 2017-01-27 LAB — GLUCOSE, CAPILLARY
GLUCOSE-CAPILLARY: 111 mg/dL — AB (ref 65–99)
GLUCOSE-CAPILLARY: 145 mg/dL — AB (ref 65–99)
GLUCOSE-CAPILLARY: 189 mg/dL — AB (ref 65–99)
Glucose-Capillary: 152 mg/dL — ABNORMAL HIGH (ref 65–99)

## 2017-01-27 NOTE — Progress Notes (Signed)
Pt attend wrap up group. Her day was a 7. Her goal was to have a better understanding self and others. She's more compassionate.

## 2017-01-27 NOTE — [facility] Group Notes (Signed)
[facility] Group Notes:  (Clinical Social Work)  01/27/2017  10:00-11:00AM  Summary of Progress/Problems:  The main focus of today's process group was to listen to a variety of genres of music and to identify that different types of music provoke different responses.  The patient then was able to identify personally what was soothing for them, as well as energizing, as well as how patient can personally use this knowledge in sleep habits, with depression, and with other symptoms.  The patient expressed at the beginning of group the overall feeling of "Excited" because she had been on the phone with her grandchildren laughing.  She participated fully in the group, singing along and saying how each song made her feel.  Type of Therapy:  Music Therapy   Participation Level:  Active  Participation Quality:  Attentive and Sharing  Affect:  Blunted  Cognitive:  Oriented  Insight:  Engaged  Engagement in Therapy:  Engaged  Modes of Intervention:   Activity, Exploration  Selmer Dominion, LCSW 01/27/2017
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
## 2017-01-27 NOTE — Progress Notes (Signed)
Susan [name] has been up and active today, spoke of her day and spoke about feeling better. She reports that she is feeling better with new medications and reports sleeping ok. She spoke about hopeful for discharge on Tuesday and spoke about feeling good about that discharge date. [name] was able to receive medications today without incident. A. Support and encouragement provided. R. Safety maintained, will continue to monitor.

## 2017-01-27 NOTE — Progress Notes (Signed)
Pt attend wrap up group. Her day was a 7. Her goal was to get better understand of self and others compassion.

## 2017-01-28 DIAGNOSIS — G4700 Insomnia, unspecified: Secondary | ICD-10-CM

## 2017-01-28 DIAGNOSIS — F29 Unspecified psychosis not due to a substance or known physiological condition: Secondary | ICD-10-CM

## 2017-01-28 LAB — GLUCOSE, CAPILLARY
GLUCOSE-CAPILLARY: 73 mg/dL (ref 65–99)
GLUCOSE-CAPILLARY: 126 mg/dL — AB (ref 65–99)
Glucose-Capillary: 74 mg/dL (ref 65–99)
Glucose-Capillary: 209 mg/dL — ABNORMAL HIGH (ref 65–99)

## 2017-01-28 MED ORDER — SERTRALINE HCL 50 MG PO TABS
50.0000 mg | ORAL_TABLET | Freq: Every day | ORAL | Status: DC
  Administered 2017-01-29: 50 mg via ORAL
  Filled 2017-01-28 (×3): qty 1
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
## 2017-01-28 NOTE — Progress Notes (Signed)
DAR NOTE: Patient presents with calm affect and depressed mood.  Denies suicidal thoughts, auditory and visual hallucinations.  Described energy level as normal and concentration as good.  Rates depression at 5, hopelessness at 5, and anxiety at 5.  Maintained on routine safety checks.  Medications given as prescribed.  Support and encouragement offered as needed.  Attended group and participated.  States goal for today is "going home."  Patient observed socializing with peers in the dayroom.  Offered no complaint.

## 2017-01-28 NOTE — Progress Notes (Signed)
D: Pt denies SI/HI/AVH. Pt is pleasant and cooperative. Pt stated she was doing much better due to her medications. Pt visible on the milieu and spent a lot of time in the dayroom with her peers  A: Pt was offered support and encouragement. Pt was given scheduled medications. Pt was encourage to attend groups. Q 15 minute checks were done for safety.   R:Pt attends groups and interacts well with peers and staff. Pt is taking medication. Pt has no complaints.Pt receptive to treatment and safety maintained on unit.

## 2017-01-28 NOTE — Progress Notes (Signed)
Adult Psychoeducational Group Note  Date:  01/28/2017 Time:  9:32 PM  Group Topic/Focus:  Wrap-Up Group:   The focus of this group is to help patients review their daily goal of treatment and discuss progress on daily workbooks.  Participation Level:  Active  Participation Quality:  Appropriate and Attentive  Affect:  Appropriate  Cognitive:  Alert and Appropriate  Insight: Appropriate  Engagement in Group:  Engaged and Improving  Modes of Intervention:  Discussion  Additional Comments:  Patient stated her goal for today was to see when she was going home. Patient stated she felt good when she achieved her goal today. Patient rated her day an 8 of 10 for today. Patient stated something positive that happened today was been able to talk with her family. Patient stated her goal for tomorrow work on going home.  Candy Sledge 01/28/2017, 9:32 PM

## 2017-01-28 NOTE — Progress Notes (Signed)
Recreation Therapy Notes  Date: 01/28/17 Time: 1000 Location: 500 Hall Dayroom  Group Topic: Leisure Education  Goal Area(s) Addresses:  Patient will identify positive leisure activities.  Patient will identify one positive benefit of participation in leisure activities.   Behavioral Response: Engaged  Intervention: Dry erase marker, eraser, slips of paper with words  Activity: Pictionary.  Patients were divided into 2 teams.  Patients had to pull a word from the can and either draw it or act it out.  The team was given a minute to guess the picture.  If the team did not guess correctly, the other got the chance to steal the point.  If no one guessed the picture, no one got the point and the next team would go.  Education:  Leisure Education, Dentist  Education Outcome: Acknowledges education/In group clarification offered/Needs additional education  Clinical Observations/Feedback: Pt was bright and engaged.  Pt was supportive of peers and encouraged them to do their best.  Pt showed a good competitive spirit.   Victorino Sparrow, LRT/CTRS         Victorino Sparrow A 01/28/2017 12:09 PM

## 2017-01-28 NOTE — Plan of Care (Signed)
Problem: Safety: Goal: Periods of time without injury will increase Outcome: Progressing Pt safe on the unit at this time   

## 2017-01-29 LAB — GLUCOSE, CAPILLARY
GLUCOSE-CAPILLARY: 141 mg/dL — AB (ref 65–99)
Glucose-Capillary: 55 mg/dL — ABNORMAL LOW (ref 65–99)
Glucose-Capillary: 114 mg/dL — ABNORMAL HIGH (ref 65–99)

## 2017-01-29 MED ORDER — METFORMIN HCL 500 MG PO TABS: 500.0000 mg | ORAL_TABLET | Freq: Two times a day (BID) | ORAL | 0 refills | Status: DC

## 2017-01-29 MED ORDER — QUETIAPINE FUMARATE 200 MG PO TABS: ORAL_TABLET | ORAL | 0 refills | Status: DC

## 2017-01-29 MED ORDER — HYDROXYZINE HCL 50 MG PO TABS: 50.0000 mg | ORAL_TABLET | ORAL | 0 refills | Status: DC | PRN

## 2017-01-29 MED ORDER — SERTRALINE HCL 50 MG PO TABS: 50.0000 mg | ORAL_TABLET | Freq: Every day | ORAL | 0 refills | Status: DC

## 2017-01-29 MED ORDER — AMLODIPINE BESYLATE 10 MG PO TABS: 10.0000 mg | ORAL_TABLET | Freq: Every day | ORAL | 0 refills | Status: DC

## 2017-01-29 MED ORDER — ENALAPRIL MALEATE 20 MG PO TABS: 20.0000 mg | ORAL_TABLET | Freq: Every day | ORAL | 0 refills | Status: DC
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
## 2017-01-29 NOTE — Progress Notes (Signed)
  [facility] Adult Case Management Discharge Plan :  Will you be returning to the same living situation after discharge:  Yes,  home At discharge, do you have transportation home?: Yes,  [facility] will provide Do you have the ability to pay for your medications: Yes,  insurance  Release of information consent forms completed and in the chart;  Patient's signature needed at discharge.  Patient to Follow up at: Follow-up Information    [facility] Follow up on 01/31/2017.   Specialty:  Behavioral Health Why:  Thursday at 10:20 for your hospital follow up appointment Contact information: [place] [address] [phone]           Next level of care provider has access to Eugenio Saenz and Suicide Prevention discussed: Yes,  yes  Have you used any form of tobacco in the last 30 days? (Cigarettes, Smokeless Tobacco, Cigars, and/or Pipes): Yes  Has patient been referred to the Quitline?: Patient refused referral  Patient has been referred for addiction treatment: [place] 01/29/2017, 10:12 AM

## 2017-01-29 NOTE — Progress Notes (Signed)
Susan [name] had been up and visible in milieu this evening, attended and participated in group activity. Susan [name] seen interacting appropriately with peers, spoke about getting discharged and spoke about how she is feeling ready to go and reported the adjustment in medications has been beneficial for her. Susan [name] was able to receive all bedtime medications without incident and did not verbalize any complaints of pain. A. Support and encouragement provided. R. Safety maintained, will continue to monitor.

## 2017-01-29 NOTE — Plan of Care (Signed)
Problem: [facility] Participation in Recreation Therapeutic Interventions Goal: STG-Patient will identify at least five coping skills for ** STG: Coping Skills - Patient will be able to identify at least 5 coping skills for depression by conclusion of recreation therapy tx  Outcome: Completed/Met Date Met: 01/29/17 Pt was able to identify coping skills at completion of coping skills recreation therapy session.  Victorino Sparrow, LRT/CTRS

## 2017-01-29 NOTE — Social Work (Signed)
Referred to [facility] Transitional Care Team, is [place] resident.  Arby Dahir, LCSW Lead Clinical Social Worker Phone:  [phone]  

## 2017-01-29 NOTE — Progress Notes (Signed)
Patient discharged to lobby. Patient was stable and appreciative at that time. All papers, samples and prescriptions were given and valuables returned. Verbal understanding expressed. Denies SI/HI and A/VH. Patient given opportunity to express concerns and ask questions.  

## 2017-01-29 NOTE — [facility] Suicide Risk Assessment (Signed)
[facility] INPATIENT:  Family/Significant Other Suicide Prevention Education  Suicide Prevention Education:  Education Completed; [name], daughter, [phone]  has been identified by the patient as the family member/significant other with whom the patient will be residing, and identified as the person(s) who will aid the patient in the event of a mental health crisis (suicidal ideations/suicide attempt).  With written consent from the patient, the family member/significant other has been provided the following suicide prevention education, prior to the and/or following the discharge of the patient.  The suicide prevention education provided includes the following:  Suicide risk factors  Suicide prevention and interventions  National Suicide Hotline telephone number  [facility] assessment telephone number  [facility] Emergency Assistance [place] and/or Residential Mobile Crisis Unit telephone number  Request made of family/significant other to:  Remove weapons (e.g., guns, rifles, knives), all items previously/currently identified as safety concern.    Remove drugs/medications (over-the-counter, prescriptions, illicit drugs), all items previously/currently identified as a safety concern.  The family member/significant other verbalizes understanding of the suicide prevention education information provided.  The family member/significant other agrees to remove the items of safety concern listed above.  Trish Mage 01/29/2017, 9:14 AM

## 2017-01-29 NOTE — Progress Notes (Signed)
Hypoglycemic Event  CBG: 55  Treatment: 15 GM carbohydrate snack  Symptoms: Shaky  Follow-up CBG: Time: 1239 CBG Result:114  Possible Reasons for Event: Unknown  Comments/MD notified:  Dr. Shea Evans notified.    Susan [name]

## 2017-01-29 NOTE — Progress Notes (Signed)
Recreation Therapy Notes  Date: 01/29/17 Time: 1000 Location: 500 Hall Dayroom  Group Topic: Coping Skills  Goal Area(s) Addresses:  Patients will be able to identify positive coping skills. Patients will be able to identify benefits of using coping skills post d/c.  Behavioral Response: Engaged  Intervention: Mindmap, pencils  Activity: Mindmap.  Patients were given a blank copy of a mindmap.  LRT and patients filled in the first eight boxes together with various situations that require coping skills (bills, loss, anger, medication, family, surgery, relapse and stress.  Patients were to then identify coping skills that can be used to deal with these situations.  LRT would then fill in the coping skills on the board so patients could fill in the blanks on their sheets.  Education: Radiographer, therapeutic, Dentist.   Education Outcome: Acknowledges understanding/In group clarification offered/Needs additional education.   Clinical Observations/Feedback  Pt stated she used to use bad coping skills because "I liked the misery".  Pt identified her positive coping skills as writing and reading for loss; pay them for bills; rest and music for surgery and don't be afraid to say no for relapse.  Pt also expressed that in using coping skills post discharge, if one doesn't work to use a different one.   Victorino Sparrow, LRT/CTRS       Victorino Sparrow A 01/29/2017 11:43 AM

## 2017-01-29 NOTE — Tx Team (Signed)
Interdisciplinary Treatment and Diagnostic Plan Update  01/29/2017 Time of Session: 8:34 AM  Susan [name] MRN: [id number]  Principal Diagnosis: Bipolar D/O, depressed  Secondary Diagnoses: Active Problems:   MDD (major depressive disorder)   Current Medications:  Current Facility-Administered Medications  Medication Dose Route Frequency Provider Last Rate Last Dose  . acetaminophen (TYLENOL) tablet 650 mg  650 mg Oral Q6H PRN Kerrie Buffalo, NP      . albuterol (PROVENTIL HFA;VENTOLIN HFA) 108 (90 Base) MCG/ACT inhaler 2 puff  2 puff Inhalation Q6H PRN Patrecia Pour, NP      . alum & mag hydroxide-simeth (MAALOX/MYLANTA) 200-200-20 MG/5ML suspension 30 mL  30 mL Oral Q4H PRN Kerrie Buffalo, NP      . amLODipine (NORVASC) tablet 10 mg  10 mg Oral Daily Patrecia Pour, NP   10 mg at 01/28/17 0809  . cloNIDine (CATAPRES) tablet 0.2 mg  0.2 mg Oral BID Patrecia Pour, NP   0.2 mg at 01/28/17 9373  . elvitegravir-cobicistat-emtricitabine-tenofovir (GENVOYA) 150-150-200-10 MG tablet 1 tablet  1 tablet Oral QHS Cobos, Myer Peer, MD   1 tablet at 01/28/17 2049  . enalapril (VASOTEC) tablet 20 mg  20 mg Oral Daily Patrecia Pour, NP   20 mg at 01/28/17 4287  . gabapentin (NEURONTIN) capsule 600 mg  600 mg Oral QID Patrecia Pour, NP   600 mg at 01/29/17 0757  . hydrOXYzine (ATARAX/VISTARIL) tablet 50 mg  50 mg Oral Q4H PRN Kerrie Buffalo, NP      . insulin NPH Human (HUMULIN N,NOVOLIN N) injection 80 Units  80 Units Subcutaneous QAC breakfast Patrecia Pour, NP   80 Units at 01/29/17 6811  . LORazepam (ATIVAN) tablet 0.5 mg  0.5 mg Oral Q6H PRN Kerrie Buffalo, NP   0.5 mg at 01/26/17 1903  . magnesium hydroxide (MILK OF MAGNESIA) suspension 30 mL  30 mL Oral Daily PRN Kerrie Buffalo, NP      . metFORMIN (GLUCOPHAGE) tablet 500 mg  500 mg Oral BID WC Patrecia Pour, NP   500 mg at 01/29/17 0755  . QUEtiapine (SEROQUEL) tablet 200 mg  200 mg Oral QPC breakfast Cobos, Myer Peer, MD    200 mg at 01/29/17 0755  . QUEtiapine (SEROQUEL) tablet 400 mg  400 mg Oral QHS Cobos, Myer Peer, MD   400 mg at 01/28/17 2050  . rosuvastatin (CRESTOR) tablet 20 mg  20 mg Oral Daily Patrecia Pour, NP   20 mg at 01/29/17 0754  . sertraline (ZOLOFT) tablet 50 mg  50 mg Oral Daily Eappen, Ria Clock, MD   50 mg at 01/29/17 0755  . triamterene-hydrochlorothiazide (MAXZIDE-25) 37.5-25 MG per tablet 1 tablet  1 tablet Oral Daily Patrecia Pour, NP   1 tablet at 01/29/17 0756    PTA Medications: Prescriptions Prior to Admission  Medication Sig Dispense Refill Last Dose  . albuterol (PROVENTIL HFA;VENTOLIN HFA) 108 (90 Base) MCG/ACT inhaler Inhale 2 puffs into the lungs every 6 (six) hours as needed for wheezing or shortness of breath. 1 Inhaler 2 Past Month at Unknown time  . amLODipine (NORVASC) 10 MG tablet Take 1 tablet (10 mg total) by mouth daily. 90 tablet 3 01/21/2017 at Unknown time  . B-D ULTRAFINE III SHORT PEN 31G X 8 MM MISC USE AS DIRECTED TWICE DAILY 100 each 0 Taking  . beclomethasone (BECONASE-AQ) 42 MCG/SPRAY nasal spray Place 2 sprays into both nostrils 2 (two) times daily. Dose is for each nostril.  25 g 12 Past Month at Unknown time  . cloNIDine (CATAPRES) 0.2 MG tablet Take 1 tablet (0.2 mg total) by mouth 2 (two) times daily. 180 tablet 3 01/21/2017 at Unknown time  . diclofenac sodium (VOLTAREN) 1 % GEL Apply 2 g topically 3 (three) times daily as needed. 300 g 6 Past Week at Unknown time  . enalapril (VASOTEC) 20 MG tablet TAKE 1 TABLET BY MOUTH DAILY 30 tablet 0 01/21/2017 at Unknown time  . gabapentin (NEURONTIN) 300 MG capsule Take 2 capsules (600 mg total) by mouth 4 (four) times daily. 720 capsule 1 01/21/2017 at Unknown time  . GENVOYA 150-150-200-10 MG TABS tablet TAKE 1 TABLET BY MOUTH DAILY 30 tablet 5 01/21/2017 at Unknown time  . glucose blood (ONETOUCH VERIO) test strip 1 each by Other route 2 (two) times daily. And lancets 2/day. 180 each 12 Taking  . hydrOXYzine  (VISTARIL) 25 MG capsule take 55m by mouth three times daily  1 01/21/2017 at Unknown time  . Insulin NPH, Human,, Isophane, (HUMULIN N KWIKPEN) 100 UNIT/ML Kiwkpen Inject 80 Units into the skin every morning. And pen needles 2/day 30 mL 11 01/21/2017 at Unknown time  . metFORMIN (GLUCOPHAGE) 500 MG tablet Take 1 tablet (500 mg total) by mouth 2 (two) times daily with a meal. 180 tablet 3 01/21/2017 at Unknown time  . omeprazole (PRILOSEC) 20 MG capsule Take 1 capsule (20 mg total) by mouth daily. Appointment needed for additional refills 90 capsule 0 01/21/2017 at Unknown time  . QUEtiapine (SEROQUEL) 100 MG tablet Take 1 tablet (100 mg total) by mouth daily. 30 tablet 0 01/21/2017 at Unknown time  . rosuvastatin (CRESTOR) 20 MG tablet Take 1 tablet (20 mg total) by mouth daily. 90 tablet 3 01/21/2017 at Unknown time  . traMADol (ULTRAM) 50 MG tablet Take 1 tablet (50 mg total) by mouth every 12 (twelve) hours as needed. 60 tablet 0 01/21/2017 at Unknown time  . triamterene-hydrochlorothiazide (MAXZIDE-25) 37.5-25 MG tablet Take 1 tablet by mouth daily. 90 tablet 3 01/21/2017 at Unknown time    Treatment Modalities: Medication Management, Group therapy, Case management,  1 to 1 session with clinician, Psychoeducation, Recreational therapy.   Physician Treatment Plan for Primary Diagnosis: MDD (major depressive disorder), recurrent, severe, with psychosis ([place] Long Term Goal(s): Improvement in symptoms so as ready for discharge  Short Term Goals: Ability to disclose and discuss suicidal ideas Ability to demonstrate self-control will improve Ability to identify and develop effective coping behaviors will improve Ability to maintain clinical measurements within normal limits will improve Ability to verbalize feelings will improve Ability to disclose and discuss suicidal ideas Ability to demonstrate self-control will improve Ability to identify and develop effective coping behaviors will  improve Ability to maintain clinical measurements within normal limits will improve  Medication Management: Evaluate patient's response, side effects, and tolerance of medication regimen.  Therapeutic Interventions: 1 to 1 sessions, Unit Group sessions and Medication administration.  Evaluation of Outcomes: Adequate for Discharge  Physician Treatment Plan for Secondary Diagnosis: Principal Problem:   MDD (major depressive disorder), recurrent, severe, with psychosis ([place]   Long Term Goal(s): Improvement in symptoms so as ready for discharge  Short Term Goals: Ability to disclose and discuss suicidal ideas Ability to demonstrate self-control will improve Ability to identify and develop effective coping behaviors will improve Ability to maintain clinical measurements within normal limits will improve Ability to verbalize feelings will improve Ability to disclose and discuss suicidal ideas Ability to demonstrate self-control will  improve Ability to identify and develop effective coping behaviors will improve Ability to maintain clinical measurements within normal limits will improve  Medication Management: Evaluate patient's response, side effects, and tolerance of medication regimen.  Therapeutic Interventions: 1 to 1 sessions, Unit Group sessions and Medication administration.  Evaluation of Outcomes: Adequate for Discharge   RN Treatment Plan for Primary Diagnosis: MDD (major depressive disorder), recurrent, severe, with psychosis ([place]) Long Term Goal(s): Knowledge of disease and therapeutic regimen to maintain health will improve  Short Term Goals: Ability to disclose and discuss suicidal ideas and Compliance with prescribed medications will improve  Medication Management: RN will administer medications as ordered by provider, will assess and evaluate patient's response and provide education to patient for prescribed medication. RN will report any adverse and/or side effects to  prescribing provider.  Therapeutic Interventions: 1 on 1 counseling sessions, Psychoeducation, Medication administration, Evaluate responses to treatment, Monitor vital signs and CBGs as ordered, Perform/monitor CIWA, COWS, AIMS and Fall Risk screenings as ordered, Perform wound care treatments as ordered.  Evaluation of Outcomes: Adequate for Discharge    Recreational Therapy Treatment Plan for Primary Diagnosis: MDD (major depressive disorder), recurrent, severe, with psychosis ([place]) Long Term Goal(s): Patient will participate in recreation therapy treatment in at least 2 group sessions without prompting from LRT  Short Term Goals: Patient will be able to identify at least 5 coping skills for admitting diagnosis by conclusion of recreation therapy treatment  Treatment Modalities: Group and Pet Therapy  Therapeutic Interventions: Psychoeducation  Evaluation of Outcomes: Adequate for Discharge   LCSW Treatment Plan for Primary Diagnosis: MDD (major depressive disorder), recurrent, severe, with psychosis ([place]) Long Term Goal(s): Safe transition to appropriate next level of care at discharge, Engage patient in therapeutic group addressing interpersonal concerns.  Short Term Goals: Engage patient in aftercare planning with referrals and resources  Therapeutic Interventions: Assess for all discharge needs, 1 to 1 time with Social worker, Explore available resources and support systems, Assess for adequacy in community support network, Educate family and significant other(s) on suicide prevention, Complete Psychosocial Assessment, Interpersonal group therapy.  Evaluation of Outcomes: Met  Return home, follow up outpt   Progress in Treatment: Attending groups: Yes Participating in groups: Yes Taking medication as prescribed: Yes Toleration medication: Yes, no side effects reported at this time Family/Significant other contact made: No  Patient understands diagnosis: Yes AEB asking for  help with all her symptoms Discussing patient identified problems/goals with staff: Yes Medical problems stabilized or resolved: Yes Denies suicidal/homicidal ideation: Yes Issues/concerns per patient self-inventory: None Other: N/A  New problem(s) identified: None identified at this time.   New Short Term/Long Term Goal(s): None identified at this time.   Discharge Plan or Barriers:   Reason for Continuation of Hospitalization:   Estimated Length of Stay: D/C today  Attendees: Patient: 01/29/2017  8:34 AM  Physician: Ursula Alert, MD 01/29/2017  8:34 AM  Nursing: Hoy Register, RN 01/29/2017  8:34 AM  RN Care Manager: Lars Pinks, RN 01/29/2017  8:34 AM  Social Worker: Ripley Fraise 01/29/2017  8:34 AM  Recreational Therapist: Victorino Sparrow, LRT/CTRS 01/29/2017  8:34 AM  Other: Norberto Sorenson 01/29/2017  8:34 AM  Other:  01/29/2017  8:34 AM    Scribe for Treatment Team:  Roque Lias LCSW 01/29/2017 8:34 AM

## 2017-01-29 NOTE — BHH Suicide Risk Assessment (Signed)
[facility] Discharge Suicide Risk Assessment   Principal Problem: MDD (major depressive disorder), recurrent, severe, with psychosis ([place]) Discharge Diagnoses:  Patient Active Problem List   Diagnosis Date Noted  . Osteoarthritis [M19.90] 07/14/2016  . Abnormal hemoglobin ([place]) [D58.2] 04/18/2016  . Cough [R05] 04/10/2016  . Hot flashes [R23.2] 10/16/2015  . PTSD (post-traumatic stress disorder) [F43.10] 05/12/2015  . MDD (major depressive disorder), recurrent, severe, with psychosis ([place]) [F33.3] 05/11/2015  . Cannabis use disorder, severe, dependence ([place]) [F12.20] 05/11/2015  . Tobacco use disorder [F17.200] 05/11/2015  . Dehydration [E86.0] 04/17/2015  . Diabetes mellitus type 2 without retinopathy ([place]) [E11.9] 01/28/2015  . Screening examination for venereal disease [Z11.3] 06/02/2014  . Hereditary and idiopathic peripheral neuropathy [G60.9] 06/03/2013  . Dysfunctional uterine bleeding [N93.8] 06/04/2012  . Dyslipidemia [E78.5]   . GERD (gastroesophageal reflux disease) [K21.9]   . Morbid obesity ([place]) [E66.01] 04/26/2011  . HIV disease ([place]) [B20] 03/28/2011  . Hypertension [I10] 03/28/2011    Total Time spent with patient: 30 minutes  Musculoskeletal: Strength & Muscle Tone: within normal limits Gait & Station: normal Patient leans: N/A  Psychiatric Specialty Exam: Review of Systems  Psychiatric/Behavioral: Negative for depression and suicidal ideas.  All other systems reviewed and are negative.   Blood pressure 94/69, pulse (!) 102, temperature 98.7 F (37.1 C), temperature source Oral, resp. rate 20, height 5\' 3"  (1.6 m), weight 123.4 kg (272 lb), SpO2 100 %.Body mass index is 48.18 kg/m.  General Appearance: Casual  Eye Contact::  Fair  Speech:  Clear and Coherent409  Volume:  Normal  Mood:  Euthymic  Affect:  Appropriate  Thought Process:  Goal Directed and Descriptions of Associations: Intact  Orientation:  Full (Time, Place, and Person)  Thought Content:   Logical  Suicidal Thoughts:  No  Homicidal Thoughts:  No  Memory:  Immediate;   Fair Recent;   Fair Remote;   Fair  Judgement:  Fair  Insight:  Fair  Psychomotor Activity:  Normal  Concentration:  Fair  Recall:  [employer] of Knowledge:Fair  Language: Fair  Akathisia:  No  Handed:  Right  AIMS (if indicated):   0  Assets:  Communication Skills Desire for Improvement  Sleep:  Number of Hours: 6.75  Cognition: WNL  ADL's:  Intact   Mental Status Per Nursing Assessment::   On Admission:  Suicidal ideation indicated by patient, Self-harm thoughts  Demographic Factors:  NA  Loss Factors: NA  Historical Factors: Impulsivity  Risk Reduction Factors:   Positive social support  Continued Clinical Symptoms:  Previous Psychiatric Diagnoses and Treatments  Cognitive Features That Contribute To Risk:  None    Suicide Risk:  Minimal: No identifiable suicidal ideation.  Patients presenting with no risk factors but with morbid ruminations; may be classified as minimal risk based on the severity of the depressive symptoms  Follow-up Information    [facility] Follow up on 01/31/2017.   Specialty:  Behavioral Health Why:  Thursday at 10:20 for your hospital follow up appointment Contact information: [place] [id number] [phone]           Plan Of Care/Follow-up recommendations:  Activity:  no restrictions Diet:  carb modified Tests:  as needed Other:  follow up with aftercare  Francisco Eyerly, MD 01/29/2017, 11:30 AM

## 2017-01-30 ENCOUNTER — Ambulatory Visit (INDEPENDENT_AMBULATORY_CARE_PROVIDER_SITE_OTHER): Payer: Medicare Other | Admitting: Endocrinology

## 2017-01-30 ENCOUNTER — Encounter: Payer: Self-pay | Admitting: Endocrinology

## 2017-01-30 VITALS — BP 136/84 | HR 85 | Ht 63.0 in | Wt 273.0 lb

## 2017-01-30 DIAGNOSIS — E119 Type 2 diabetes mellitus without complications: Secondary | ICD-10-CM

## 2017-01-30 MED ORDER — METFORMIN HCL 500 MG PO TABS: 1000.0000 mg | ORAL_TABLET | Freq: Two times a day (BID) | ORAL | 11 refills | Status: DC

## 2017-01-30 NOTE — Patient Instructions (Addendum)
Please continue the NPH insulin, 80 units each morning, and: Change the metformin to 2 pills in the morning, and none in the evening.    On this type of insulin schedule, you should eat meals on a regular schedule.  If a meal is missed or significantly delayed, your blood sugar could go low.    check your blood sugar twice a day.  vary the time of day when you check, between before the 3 meals, and at bedtime.  also check if you have symptoms of your blood sugar being too high or too low.  please keep a record of the readings and bring it to your next appointment here (or you can bring the meter itself).  You can write it on any piece of paper.  please call us sooner if your blood sugar goes below 70, or if you have a lot of readings over 200. Please come back for a follow-up appointment in 6 weeks.
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
## 2017-02-18 ENCOUNTER — Telehealth: Payer: Self-pay | Admitting: Internal Medicine

## 2017-02-18 NOTE — Telephone Encounter (Signed)
Routing to greg, please advise if I need to find form or if you need anything else, thanks

## 2017-02-18 NOTE — Telephone Encounter (Signed)
Samantha from Dr Royce Macadamia office called about this pt needing surgical clearance. She said that she spoke with someone regarding this and that she was told that Dr Sharlet Salina was out but that they would see if another physician could give the okay. She is having oral surgery to remove three teeth and to smooth down the bone. The clearance form has been faxed over.

## 2017-02-19 NOTE — Telephone Encounter (Signed)
Office has called back in regard.  I have asked them to fax the clearance form over again to the [place].

## 2017-02-20 NOTE — Telephone Encounter (Signed)
Dr Lupita Leash office advised of Susan [name] note/instructions, they will contact patient to advise that she needs to schedule visit with [place] for clearance

## 2017-02-20 NOTE — Telephone Encounter (Signed)
Dr. Freddi Starr office called and states they need the clearance to say she is okay for surgery in his opinion, they said he just signed it but it needs to say she is cleared for surgery. They are faxing it back to [place] this morning.

## 2017-02-20 NOTE — Telephone Encounter (Signed)
She has several co-morbid physiological and psychological conditions. She should be okay to have dental procedure completed, however cannot fully medically clear her without being seen as she has been to the hospital since being seen last in the office.

## 2017-02-26 ENCOUNTER — Ambulatory Visit (INDEPENDENT_AMBULATORY_CARE_PROVIDER_SITE_OTHER): Payer: Medicare Other | Admitting: Nurse Practitioner

## 2017-02-26 ENCOUNTER — Encounter: Payer: Self-pay | Admitting: Nurse Practitioner

## 2017-02-26 ENCOUNTER — Other Ambulatory Visit (INDEPENDENT_AMBULATORY_CARE_PROVIDER_SITE_OTHER): Payer: Medicare Other

## 2017-02-26 VITALS — BP 146/86 | HR 93 | Temp 98.7°F | Ht 63.0 in | Wt 271.0 lb

## 2017-02-26 DIAGNOSIS — D582 Other hemoglobinopathies: Secondary | ICD-10-CM

## 2017-02-26 DIAGNOSIS — E119 Type 2 diabetes mellitus without complications: Secondary | ICD-10-CM | POA: Diagnosis not present

## 2017-02-26 DIAGNOSIS — K0889 Other specified disorders of teeth and supporting structures: Secondary | ICD-10-CM

## 2017-02-26 DIAGNOSIS — K0510 Chronic gingivitis, plaque induced: Secondary | ICD-10-CM

## 2017-02-26 DIAGNOSIS — Z01810 Encounter for preprocedural cardiovascular examination: Secondary | ICD-10-CM | POA: Diagnosis not present

## 2017-02-26 DIAGNOSIS — I10 Essential (primary) hypertension: Secondary | ICD-10-CM | POA: Diagnosis not present

## 2017-02-26 LAB — CBC WITH DIFFERENTIAL/PLATELET
Basophils Absolute: 0 10*3/uL (ref 0.0–0.1)
Basophils Relative: 0.3 % (ref 0.0–3.0)
EOS ABS: 0.3 10*3/uL (ref 0.0–0.7)
Eosinophils Relative: 2.6 % (ref 0.0–5.0)
HEMATOCRIT: 49.1 % — AB (ref 36.0–46.0)
Hemoglobin: 16.8 g/dL — ABNORMAL HIGH (ref 12.0–15.0)
LYMPHS ABS: 2.6 10*3/uL (ref 0.7–4.0)
LYMPHS PCT: 23.4 % (ref 12.0–46.0)
MCHC: 34.1 g/dL (ref 30.0–36.0)
MCV: 90.9 fl (ref 78.0–100.0)
MONOS PCT: 7.5 % (ref 3.0–12.0)
Monocytes Absolute: 0.8 10*3/uL (ref 0.1–1.0)
NEUTROS ABS: 7.4 10*3/uL (ref 1.4–7.7)
NEUTROS PCT: 66.2 % (ref 43.0–77.0)
PLATELETS: 232 10*3/uL (ref 150.0–400.0)
RBC: 5.4 Mil/uL — ABNORMAL HIGH (ref 3.87–5.11)
RDW: 14.6 % (ref 11.5–15.5)
WBC: 11.2 10*3/uL — ABNORMAL HIGH (ref 4.0–10.5)

## 2017-02-26 LAB — COMPREHENSIVE METABOLIC PANEL
ALT: 11 U/L (ref 0–35)
AST: 10 U/L (ref 0–37)
Albumin: 4.6 g/dL (ref 3.5–5.2)
Alkaline Phosphatase: 150 U/L — ABNORMAL HIGH (ref 39–117)
BUN: 13 mg/dL (ref 6–23)
CHLORIDE: 87 meq/L — AB (ref 96–112)
CO2: 36 meq/L — AB (ref 19–32)
Calcium: 10.3 mg/dL (ref 8.4–10.5)
Creatinine, Ser: 1.25 mg/dL — ABNORMAL HIGH (ref 0.40–1.20)
GFR: 57.28 mL/min — AB (ref >60.00)
GLUCOSE: 305 mg/dL — AB (ref 70–99)
Potassium: 3.3 mEq/L — ABNORMAL LOW (ref 3.5–5.1)
Sodium: 131 mEq/L — ABNORMAL LOW (ref 135–145)
Total Bilirubin: 0.4 mg/dL (ref 0.2–1.2)
Total Protein: 8.4 g/dL — ABNORMAL HIGH (ref 6.0–8.3)

## 2017-02-26 NOTE — Progress Notes (Signed)
Subjective:  Patient ID: Susan [name], female    DOB: 1961/03/10  Age: 56 y.o. MRN: [id number]  CC: Follow-up (surgical clearance for teeth removal. jaw pain consult?)   Dental Pain   This is a chronic problem. The current episode started more than 1 year ago. The problem occurs constantly. The problem has been unchanged. Associated symptoms include thermal sensitivity. Pertinent negatives include no difficulty swallowing, facial pain, fever, oral bleeding or sinus pressure.  need surgical clearance for teeth extraction to be scheduled.  Denies any acute complaints.  Outpatient Medications Prior to Visit  Medication Sig Dispense Refill  . amLODipine (NORVASC) 10 MG tablet Take 1 tablet (10 mg total) by mouth daily. 30 tablet 0  . cloNIDine (CATAPRES) 0.2 MG tablet Take 1 tablet (0.2 mg total) by mouth 2 (two) times daily. 180 tablet 3  . enalapril (VASOTEC) 20 MG tablet Take 1 tablet (20 mg total) by mouth daily. 30 tablet 0  . gabapentin (NEURONTIN) 300 MG capsule Take 2 capsules (600 mg total) by mouth 4 (four) times daily. 720 capsule 1  . GENVOYA 150-150-200-10 MG TABS tablet TAKE 1 TABLET BY MOUTH DAILY 30 tablet 5  . hydrOXYzine (ATARAX/VISTARIL) 50 MG tablet Take 1 tablet (50 mg total) by mouth every 4 (four) hours as needed for anxiety. 30 tablet 0  . Insulin NPH, Human,, Isophane, (HUMULIN N KWIKPEN) 100 UNIT/ML Kiwkpen Inject 80 Units into the skin every morning. And pen needles 2/day 30 mL 11  . metFORMIN (GLUCOPHAGE) 500 MG tablet Take 2 tablets (1,000 mg total) by mouth 2 (two) times daily with a meal. 60 tablet 11  . QUEtiapine (SEROQUEL) 200 MG tablet Take one tablet (200 mg) in the morning.  Take 2 tablets (400 mg) at bedtime. 30 tablet 0  . rosuvastatin (CRESTOR) 20 MG tablet Take 1 tablet (20 mg total) by mouth daily. 90 tablet 3  . sertraline (ZOLOFT) 50 MG tablet Take 1 tablet (50 mg total) by mouth daily. 30 tablet 0  . triamterene-hydrochlorothiazide (MAXZIDE-25)  37.5-25 MG tablet Take 1 tablet by mouth daily. 90 tablet 3   No facility-administered medications prior to visit.     ROS See HPI  Objective:  BP (!) 146/86   Pulse 93   Temp 98.7 F (37.1 C)   Ht 5\' 3"  (1.6 m)   Wt 271 lb (122.9 kg)   LMP  (LMP Unknown)   SpO2 98%   BMI 48.01 kg/m   BP Readings from Last 3 Encounters:  02/26/17 (!) 146/86  01/30/17 136/84  01/23/17 115/88    Wt Readings from Last 3 Encounters:  02/26/17 271 lb (122.9 kg)  01/30/17 273 lb (123.8 kg)  01/08/17 276 lb (125.2 kg)    Physical Exam  Constitutional: She is oriented to person, place, and time. No distress.  HENT:  Right Ear: External ear normal.  Left Ear: External ear normal.  Nose: Nose normal.  Mouth/Throat: Uvula is midline. She does not have dentures. No trismus in the jaw. Abnormal dentition. Dental caries present. No posterior oropharyngeal edema or posterior oropharyngeal erythema.  Front lower Gum swelling and tenderness  Eyes: No scleral icterus.  Neck: Normal range of motion. Neck supple. No thyromegaly present.  Cardiovascular: Normal rate, regular rhythm and normal heart sounds.   Pulmonary/Chest: Effort normal and breath sounds normal.  Musculoskeletal: She exhibits no edema.  Lymphadenopathy:    She has no cervical adenopathy.  Neurological: She is alert and oriented to person, place, and  time.  Vitals reviewed.   Lab Results  Component Value Date   WBC 11.2 (H) 02/26/2017   HGB 16.8 (H) 02/26/2017   HCT 49.1 (H) 02/26/2017   PLT 232.0 02/26/2017   GLUCOSE 305 (H) 02/26/2017   CHOL 222 (H) 01/08/2017   TRIG 214.0 (H) 01/08/2017   HDL 50.40 01/08/2017   LDLDIRECT 149.0 01/08/2017   LDLCALC 119 04/02/2016   ALT 11 02/26/2017   AST 10 02/26/2017   NA 131 (L) 02/26/2017   K 3.3 (L) 02/26/2017   CL 87 (L) 02/26/2017   CREATININE 1.25 (H) 02/26/2017   BUN 13 02/26/2017   CO2 36 (H) 02/26/2017   TSH 0.80 10/11/2015   HGBA1C 9.5 (H) 01/08/2017   MICROALBUR 0.1  01/26/2014   ECG: Normal Sinus rhythm, no change compared to previous ECG.  Assessment & Plan:   Susan [name] was seen today for follow-up.  Diagnoses and all orders for this visit:  Pre-operative cardiovascular examination -     EKG 12-Lead  Essential hypertension -     Comprehensive metabolic panel; Future  Diabetes mellitus type 2 without retinopathy ([place]) -     Comprehensive metabolic panel; Future  Abnormal hemoglobin (HCC) -     CBC w/Diff; Future  Gingivitis, chronic -     amoxicillin-clavulanate (AUGMENTIN) 875-125 MG tablet; Take 1 tablet by mouth 2 (two) times daily. -     chlorhexidine (PERIDEX) 0.12 % solution; Use as directed 15 mLs in the mouth or throat 2 (two) times daily.  Pain in a tooth or teeth -     amoxicillin-clavulanate (AUGMENTIN) 875-125 MG tablet; Take 1 tablet by mouth 2 (two) times daily. -     chlorhexidine (PERIDEX) 0.12 % solution; Use as directed 15 mLs in the mouth or throat 2 (two) times daily.   I have discontinued Susan [name]'s triamterene-hydrochlorothiazide. I am also having her start on amoxicillin-clavulanate and chlorhexidine. Additionally, I am having her maintain her Insulin NPH (Human) (Isophane), cloNIDine, GENVOYA, gabapentin, rosuvastatin, amLODipine, hydrOXYzine, enalapril, sertraline, QUEtiapine, and metFORMIN.  Meds ordered this encounter  Medications  . amoxicillin-clavulanate (AUGMENTIN) 875-125 MG tablet    Sig: Take 1 tablet by mouth 2 (two) times daily.    Dispense:  20 tablet    Refill:  0    Order Specific Question:   Supervising Provider    Answer:   Cassandria Anger [[id number]]  . chlorhexidine (PERIDEX) 0.12 % solution    Sig: Use as directed 15 mLs in the mouth or throat 2 (two) times daily.    Dispense:  473 mL    Refill:  0    Order Specific Question:   Supervising Provider    Answer:   Cassandria Anger [[id number]]    Follow-up: Return in about 1 week (around 03/05/2017), or if symptoms worsen or fail to improve, for  repeat BMP and BP.  Wilfred Lacy, NP

## 2017-02-26 NOTE — Patient Instructions (Addendum)
Cbc indicates persistent elevated WBC and  RBC, but stable. CMP indicates electrolyte imbalance. This is due to maxzide which is a diuretic. You need to stop this medication immediately. Also sent oral abx for gum disease and teeth decay. Make OV appt for re eval of BMP and BP in 1week. If electrolytes is corrected and BP stays stable, will provide surgical clearance letter

## 2017-02-27 MED ORDER — AMOXICILLIN-POT CLAVULANATE 875-125 MG PO TABS
1.0000 | ORAL_TABLET | Freq: Two times a day (BID) | ORAL | 0 refills | Status: DC
Start: 2017-02-27 — End: 2017-04-30

## 2017-02-27 MED ORDER — CHLORHEXIDINE GLUCONATE 0.12 % MT SOLN: 15.0000 mL | Freq: Two times a day (BID) | OROMUCOSAL | 0 refills | Status: DC

## 2017-03-08 ENCOUNTER — Other Ambulatory Visit: Payer: Self-pay | Admitting: Nurse Practitioner

## 2017-03-08 ENCOUNTER — Encounter: Payer: Self-pay | Admitting: Nurse Practitioner

## 2017-03-08 ENCOUNTER — Ambulatory Visit (INDEPENDENT_AMBULATORY_CARE_PROVIDER_SITE_OTHER): Payer: Medicare Other | Admitting: Nurse Practitioner

## 2017-03-08 ENCOUNTER — Other Ambulatory Visit (INDEPENDENT_AMBULATORY_CARE_PROVIDER_SITE_OTHER): Payer: Medicare Other

## 2017-03-08 VITALS — BP 130/80 | HR 66 | Temp 98.3°F | Ht 63.0 in | Wt 264.0 lb

## 2017-03-08 DIAGNOSIS — Z01818 Encounter for other preprocedural examination: Secondary | ICD-10-CM

## 2017-03-08 DIAGNOSIS — E878 Other disorders of electrolyte and fluid balance, not elsewhere classified: Secondary | ICD-10-CM | POA: Diagnosis not present

## 2017-03-08 DIAGNOSIS — D72829 Elevated white blood cell count, unspecified: Secondary | ICD-10-CM

## 2017-03-08 DIAGNOSIS — K0510 Chronic gingivitis, plaque induced: Secondary | ICD-10-CM

## 2017-03-08 DIAGNOSIS — I10 Essential (primary) hypertension: Secondary | ICD-10-CM

## 2017-03-08 DIAGNOSIS — K029 Dental caries, unspecified: Secondary | ICD-10-CM

## 2017-03-08 LAB — BASIC METABOLIC PANEL
BUN: 5 mg/dL — AB (ref 6–23)
CHLORIDE: 98 meq/L (ref 96–112)
CO2: 33 mEq/L — ABNORMAL HIGH (ref 19–32)
Calcium: 9.4 mg/dL (ref 8.4–10.5)
Creatinine, Ser: 0.78 mg/dL (ref 0.40–1.20)
GFR: 98.69 mL/min (ref >60.00)
Glucose, Bld: 330 mg/dL — ABNORMAL HIGH (ref 70–99)
Potassium: 3.2 mEq/L — ABNORMAL LOW (ref 3.5–5.1)
Sodium: 138 mEq/L (ref 135–145)

## 2017-03-08 LAB — CBC WITH DIFFERENTIAL/PLATELET
BASOS PCT: 1.4 % (ref 0.0–3.0)
Basophils Absolute: 0.1 10*3/uL (ref 0.0–0.1)
EOS ABS: 0.3 10*3/uL (ref 0.0–0.7)
Eosinophils Relative: 3.6 % (ref 0.0–5.0)
HCT: 47.2 % — ABNORMAL HIGH (ref 36.0–46.0)
HEMOGLOBIN: 16.3 g/dL — AB (ref 12.0–15.0)
LYMPHS PCT: 23.9 % (ref 12.0–46.0)
Lymphs Abs: 2 10*3/uL (ref 0.7–4.0)
MCHC: 34.6 g/dL (ref 30.0–36.0)
MCV: 90.4 fl (ref 78.0–100.0)
MONO ABS: 0.5 10*3/uL (ref 0.1–1.0)
Monocytes Relative: 5.9 % (ref 3.0–12.0)
Neutro Abs: 5.6 10*3/uL (ref 1.4–7.7)
Neutrophils Relative %: 65.2 % (ref 43.0–77.0)
Platelets: 300 10*3/uL (ref 150.0–400.0)
RBC: 5.22 Mil/uL — AB (ref 3.87–5.11)
RDW: 15.7 % — ABNORMAL HIGH (ref 11.5–15.5)
WBC: 8.6 10*3/uL (ref 4.0–10.5)

## 2017-03-08 MED ORDER — POTASSIUM CHLORIDE CRYS ER 20 MEQ PO TBCR: 20.0000 meq | EXTENDED_RELEASE_TABLET | Freq: Every day | ORAL | 0 refills | Status: DC

## 2017-03-08 NOTE — Progress Notes (Signed)
Subjective:  Patient ID: Susan [name], female    DOB: Jun 30, 1961  Age: 56 y.o. MRN: [id number]  CC: Follow-up (follow up-mouth is better--need surgery clearance. )  HPI Gingivitis and tooth Decay: Reports improvement in mouth swelling and pain with oral abx and mouth wash. She still needs clearance letter for Dr. Lupita Leash office (orthodontist) to extract lower front teeth. Procedure has not yet been schedule.  Electrolyte imbalance: Has stopped maxzide as instructed.  HTN: Stable with norvasc, metoprolol, clonidine, and enalapril. Maintains a low salt diet.  Outpatient Medications Prior to Visit  Medication Sig Dispense Refill  . amLODipine (NORVASC) 10 MG tablet Take 1 tablet (10 mg total) by mouth daily. 30 tablet 0  . amoxicillin-clavulanate (AUGMENTIN) 875-125 MG tablet Take 1 tablet by mouth 2 (two) times daily. 20 tablet 0  . chlorhexidine (PERIDEX) 0.12 % solution Use as directed 15 mLs in the mouth or throat 2 (two) times daily. 473 mL 0  . cloNIDine (CATAPRES) 0.2 MG tablet Take 1 tablet (0.2 mg total) by mouth 2 (two) times daily. 180 tablet 3  . enalapril (VASOTEC) 20 MG tablet Take 1 tablet (20 mg total) by mouth daily. 30 tablet 0  . gabapentin (NEURONTIN) 300 MG capsule Take 2 capsules (600 mg total) by mouth 4 (four) times daily. 720 capsule 1  . GENVOYA 150-150-200-10 MG TABS tablet TAKE 1 TABLET BY MOUTH DAILY 30 tablet 5  . hydrOXYzine (ATARAX/VISTARIL) 50 MG tablet Take 1 tablet (50 mg total) by mouth every 4 (four) hours as needed for anxiety. 30 tablet 0  . Insulin NPH, Human,, Isophane, (HUMULIN N KWIKPEN) 100 UNIT/ML Kiwkpen Inject 80 Units into the skin every morning. And pen needles 2/day 30 mL 11  . metFORMIN (GLUCOPHAGE) 500 MG tablet Take 2 tablets (1,000 mg total) by mouth 2 (two) times daily with a meal. 60 tablet 11  . QUEtiapine (SEROQUEL) 200 MG tablet Take one tablet (200 mg) in the morning.  Take 2 tablets (400 mg) at bedtime. 30 tablet 0  .  rosuvastatin (CRESTOR) 20 MG tablet Take 1 tablet (20 mg total) by mouth daily. 90 tablet 3  . sertraline (ZOLOFT) 50 MG tablet Take 1 tablet (50 mg total) by mouth daily. 30 tablet 0   No facility-administered medications prior to visit.     ROS See HPI  ECG done 02/26/2017: normal  Objective:  BP 130/80   Pulse 66   Temp 98.3 F (36.8 C)   Ht 5\' 3"  (1.6 m)   Wt 264 lb (119.7 kg)   LMP  (LMP Unknown)   SpO2 99%   BMI 46.77 kg/m   BP Readings from Last 3 Encounters:  03/08/17 130/80  02/26/17 (!) 146/86  01/30/17 136/84    Wt Readings from Last 3 Encounters:  03/08/17 264 lb (119.7 kg)  02/26/17 271 lb (122.9 kg)  01/30/17 273 lb (123.8 kg)    Physical Exam  Constitutional: She is oriented to person, place, and time. No distress.  HENT:  Right Ear: External ear normal.  Left Ear: External ear normal.  Nose: Nose normal.  Mouth/Throat: Uvula is midline and oropharynx is clear and moist. She does not have dentures. No trismus in the jaw. Abnormal dentition. Dental caries present. No dental abscesses or uvula swelling. No oropharyngeal exudate or posterior oropharyngeal erythema.  Eyes: Conjunctivae and EOM are normal. Pupils are equal, round, and reactive to light. No scleral icterus.  Neck: Normal range of motion. Neck supple. No thyromegaly present.  Cardiovascular: Normal rate, regular rhythm and normal heart sounds.   Pulmonary/Chest: Effort normal.  Musculoskeletal: She exhibits no edema.  Lymphadenopathy:    She has no cervical adenopathy.  Neurological: She is alert and oriented to person, place, and time.  Vitals reviewed.   Lab Results  Component Value Date   WBC 8.6 03/08/2017   HGB 16.3 (H) 03/08/2017   HCT 47.2 (H) 03/08/2017   PLT 300.0 03/08/2017   GLUCOSE 330 (H) 03/08/2017   CHOL 222 (H) 01/08/2017   TRIG 214.0 (H) 01/08/2017   HDL 50.40 01/08/2017   LDLDIRECT 149.0 01/08/2017   LDLCALC 119 04/02/2016   ALT 11 02/26/2017   AST 10  02/26/2017   NA 138 03/08/2017   K 3.2 (L) 03/08/2017   CL 98 03/08/2017   CREATININE 0.78 03/08/2017   BUN 5 (L) 03/08/2017   CO2 33 (H) 03/08/2017   TSH 0.80 10/11/2015   HGBA1C 9.5 (H) 01/08/2017   MICROALBUR 0.1 01/26/2014    No results found.  Assessment & Plan:   [place] was seen today for follow-up.  Diagnoses and all orders for this visit:  Pre-op evaluation  Essential hypertension -     Basic metabolic panel; Future  Gingivitis, chronic -     CBC w/Diff; Future  Electrolyte imbalance -     Basic metabolic panel; Future -     potassium chloride SA (K-DUR,KLOR-CON) 20 MEQ tablet; Take 1 tablet (20 mEq total) by mouth daily.  Leukocytosis, unspecified type -     CBC w/Diff; Future  Tooth decay   I have discontinued Ms. Luckey's triamterene-hydrochlorothiazide. I am also having her start on potassium chloride SA. Additionally, I am having her maintain her Insulin NPH (Human) (Isophane), cloNIDine, GENVOYA, gabapentin, rosuvastatin, amLODipine, hydrOXYzine, enalapril, sertraline, QUEtiapine, metFORMIN, amoxicillin-clavulanate, and chlorhexidine.  Meds ordered this encounter  Medications  . DISCONTD: triamterene-hydrochlorothiazide (MAXZIDE-25) 37.5-25 MG tablet    Sig: TK 1 T PO D    Refill:  3  . potassium chloride SA (K-DUR,KLOR-CON) 20 MEQ tablet    Sig: Take 1 tablet (20 mEq total) by mouth daily.    Dispense:  7 tablet    Refill:  0    Order Specific Question:   Supervising Provider    Answer:   Cassandria Anger [[id number]]    Follow-up: Return if symptoms worsen or fail to improve.  Wilfred Lacy, NP

## 2017-03-08 NOTE — Patient Instructions (Addendum)
Complete oral abx as prescribed. Continue to hold maxzide for HTN. Take potassium supplement x 1week  Due to uncontrolled DM; also needs to get clearance from Dr. Loanne Drilling before proceeding with teeth extraction.  She can proceed with teeth extraction after OV with Dr. Loanne Drilling.

## 2017-03-13 ENCOUNTER — Other Ambulatory Visit: Payer: Medicare Other

## 2017-03-13 ENCOUNTER — Other Ambulatory Visit (HOSPITAL_COMMUNITY)
Admission: RE | Admit: 2017-03-13 | Discharge: 2017-03-13 | Disposition: A | Discharge status: Home / Self Care | Payer: Medicare Other | Source: Ambulatory Visit | Attending: Internal Medicine | Admitting: Internal Medicine

## 2017-03-13 ENCOUNTER — Ambulatory Visit: Payer: Medicare Other | Admitting: Endocrinology

## 2017-03-13 DIAGNOSIS — B20 Human immunodeficiency virus [HIV] disease: Secondary | ICD-10-CM | POA: Insufficient documentation

## 2017-03-13 DIAGNOSIS — Z113 Encounter for screening for infections with a predominantly sexual mode of transmission: Secondary | ICD-10-CM | POA: Insufficient documentation

## 2017-03-14 ENCOUNTER — Telehealth: Payer: Self-pay | Admitting: Internal Medicine

## 2017-03-14 LAB — URINE CYTOLOGY ANCILLARY ONLY
CHLAMYDIA, DNA PROBE: NEGATIVE
Neisseria Gonorrhea: NEGATIVE

## 2017-03-14 LAB — T-HELPER CELL (CD4) - (RCID CLINIC ONLY)
CD4 % Helper T Cell: 25 % — ABNORMAL LOW (ref 33–55)
CD4 T CELL ABS: 745 /uL (ref 400–2700)

## 2017-03-14 LAB — RPR

## 2017-03-14 NOTE — Telephone Encounter (Signed)
Spoke with Aldona Bar, faxed clearance to [phone].

## 2017-03-14 NOTE — Telephone Encounter (Signed)
Following up on oral surgery clearance.  States has not received.  Patient was seen by Baldo Ash on 7/6.

## 2017-03-15 LAB — HIV-1 RNA QUANT-NO REFLEX-BLD
HIV 1 RNA QUANT: DETECTED {copies}/mL — AB
HIV-1 RNA QUANT, LOG: DETECTED {Log_copies}/mL — AB

## 2017-03-21 ENCOUNTER — Telehealth: Payer: Self-pay | Admitting: Endocrinology

## 2017-03-21 NOTE — Progress Notes (Deleted)
Pre visit review using our clinic review tool, if applicable. No additional management support is needed unless otherwise documented below in the visit note. 

## 2017-03-21 NOTE — Telephone Encounter (Signed)
I called her and she made an appt. For tomorrow with her primary care physician. She will call back when she needs appt. Here.

## 2017-03-21 NOTE — Telephone Encounter (Signed)
Aldona Bar, Treatment Coordinator, needs medical clearance from Dr. Loanne Drilling for an oral surgery. Please call her once completed and she will provide fax#.  Thank you,  -LL

## 2017-03-21 NOTE — Telephone Encounter (Signed)
See message and please advise, Thanks!  
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
## 2017-03-21 NOTE — Telephone Encounter (Signed)
Ov next available for this.  There are appts available this afternoon.

## 2017-03-22 ENCOUNTER — Ambulatory Visit: Payer: Self-pay

## 2017-03-26 NOTE — Telephone Encounter (Signed)
Routing to you °

## 2017-03-26 NOTE — Telephone Encounter (Signed)
Dr. Parke Simmers office needs to know if it is okay to have oral surgery clearance. Please call the office to advise today.

## 2017-03-26 NOTE — Telephone Encounter (Signed)
7/26 afternoon has open appts.

## 2017-03-27 NOTE — Telephone Encounter (Signed)
Called and left patient a VM to call back to make appt. For surgery clearance.

## 2017-04-03 NOTE — Telephone Encounter (Signed)
Dr. Parke Simmers office called to see if patient had an appointment for surgery clearance. I informed them that patient has an appointment scheduled for 04/23/17.

## 2017-04-17 ENCOUNTER — Ambulatory Visit: Payer: Self-pay | Admitting: Internal Medicine

## 2017-04-23 ENCOUNTER — Ambulatory Visit (INDEPENDENT_AMBULATORY_CARE_PROVIDER_SITE_OTHER): Payer: Medicare Other | Admitting: Endocrinology

## 2017-04-23 ENCOUNTER — Inpatient Hospital Stay (HOSPITAL_COMMUNITY): Admission: RE | Admit: 2017-04-23 | Payer: Self-pay | Source: Ambulatory Visit

## 2017-04-23 ENCOUNTER — Encounter: Payer: Self-pay | Admitting: Endocrinology

## 2017-04-23 VITALS — BP 122/84 | HR 74 | Wt 271.6 lb

## 2017-04-23 DIAGNOSIS — R600 Localized edema: Secondary | ICD-10-CM | POA: Diagnosis not present

## 2017-04-23 DIAGNOSIS — E119 Type 2 diabetes mellitus without complications: Secondary | ICD-10-CM

## 2017-04-23 LAB — POCT GLYCOSYLATED HEMOGLOBIN (HGB A1C): HEMOGLOBIN A1C: 7.5
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
## 2017-04-23 NOTE — Patient Instructions (Addendum)
Please continue the same medications for diabetes. Let's check for a blood clot.  It is easy and painless.  On this type of insulin schedule, you should eat meals on a regular schedule.  If a meal is missed or significantly delayed, your blood sugar could go low.    check your blood sugar twice a day.  vary the time of day when you check, between before the 3 meals, and at bedtime.  also check if you have symptoms of your blood sugar being too high or too low.  please keep a record of the readings and bring it to your next appointment here (or you can bring the meter itself).  You can write it on any piece of paper.  please call us sooner if your blood sugar goes below 70, or if you have a lot of readings over 200. Please come back for a follow-up appointment in 3 months.

## 2017-04-24 ENCOUNTER — Encounter (HOSPITAL_COMMUNITY): Payer: Self-pay

## 2017-04-30 ENCOUNTER — Encounter: Payer: Self-pay | Admitting: Internal Medicine

## 2017-04-30 ENCOUNTER — Ambulatory Visit (INDEPENDENT_AMBULATORY_CARE_PROVIDER_SITE_OTHER): Payer: Medicare Other | Admitting: Internal Medicine

## 2017-04-30 VITALS — BP 136/69 | HR 103 | Temp 98.8°F | Ht 63.0 in | Wt 267.0 lb

## 2017-04-30 DIAGNOSIS — B20 Human immunodeficiency virus [HIV] disease: Secondary | ICD-10-CM | POA: Diagnosis not present

## 2017-04-30 DIAGNOSIS — F333 Major depressive disorder, recurrent, severe with psychotic symptoms: Secondary | ICD-10-CM

## 2017-04-30 NOTE — Assessment & Plan Note (Signed)
Doing well.  No changes. rtc 6 months.

## 2017-04-30 NOTE — Assessment & Plan Note (Signed)
She is doing well at this time with counseling and I encouraged continuing to go to [facility]

## 2017-04-30 NOTE — Progress Notes (Signed)
   Subjective:    Patient ID: Susan [name], female    DOB: 04/27/1961, 56 y.o.   MRN: [id number]  HPI Here for follow up of HIV Doing well with Genvoya.  No associated n/v/d.  Some falls recently and going to her PCP tomorrow for evaluation.  Going to [place] for depression and gets counseling.    Review of Systems  Constitutional: Negative for fatigue.  Gastrointestinal: Negative for diarrhea.  Skin: Negative for rash.  Neurological: Negative for dizziness.       Objective:   Physical Exam  Constitutional: She appears well-developed and well-nourished. No distress.  Eyes: No scleral icterus.  Cardiovascular: Normal rate, regular rhythm and normal heart sounds.   No murmur heard. Pulmonary/Chest: Effort normal and breath sounds normal. No respiratory distress.  Lymphadenopathy:    She has no cervical adenopathy.  Skin: No rash noted.          Assessment & Plan:

## 2017-05-01 ENCOUNTER — Encounter (HOSPITAL_COMMUNITY): Payer: Self-pay

## 2017-05-08 ENCOUNTER — Telehealth: Payer: Self-pay | Admitting: Endocrinology

## 2017-05-08 NOTE — Telephone Encounter (Signed)
Routing to you °

## 2017-05-08 NOTE — Telephone Encounter (Signed)
Calling from Dr. Lupita Leash office. States that patient was supposed to have a medical clearance form completed on 08/21 visit for patient to have oral surgery. Please fax to [phone]. Needs form ASAP, pt can't schedule until its complete. She states they have left several messages asking for this form.

## 2017-05-09 NOTE — Telephone Encounter (Signed)
Please advise if patient is cleared for surgery ?

## 2017-05-09 NOTE — Telephone Encounter (Signed)
Called and spoke with Aldona Bar from Dr. Lupita Leash office & sent her note that patient was medically cleared.

## 2017-05-09 NOTE — Telephone Encounter (Signed)
Her surgical risk is low and outweighed by the potential benefit of the surgery.  She is therefore medically cleared.  Please note that I only see pt for diabetes, so this clearance applies only to that.

## 2017-06-12 ENCOUNTER — Other Ambulatory Visit: Payer: Self-pay | Admitting: Internal Medicine

## 2017-06-17 ENCOUNTER — Telehealth: Payer: Self-pay | Admitting: Internal Medicine

## 2017-06-17 NOTE — Telephone Encounter (Signed)
Patient states she has a yeast infection.  Would like a script sent in to [facility].  FYI:  Pt has appt on 10/22 for falls.

## 2017-06-18 MED ORDER — FLUCONAZOLE 150 MG PO TABS: 150.0000 mg | ORAL_TABLET | Freq: Once | ORAL | 0 refills | Status: AC

## 2017-06-18 NOTE — Telephone Encounter (Signed)
Would you like patient to make a separate appointment for this week

## 2017-06-18 NOTE — Telephone Encounter (Signed)
Sent in diflucan for the yeast infection.

## 2017-06-18 NOTE — Telephone Encounter (Signed)
Tried calling both numbers provided for patient multiple times to inform patient that Rx was sent in, but is either disconnected or busy

## 2017-06-24 ENCOUNTER — Telehealth: Payer: Self-pay

## 2017-06-24 ENCOUNTER — Ambulatory Visit (INDEPENDENT_AMBULATORY_CARE_PROVIDER_SITE_OTHER): Payer: Medicare Other | Admitting: Internal Medicine

## 2017-06-24 ENCOUNTER — Other Ambulatory Visit (INDEPENDENT_AMBULATORY_CARE_PROVIDER_SITE_OTHER): Payer: Medicare Other

## 2017-06-24 ENCOUNTER — Encounter: Payer: Self-pay | Admitting: Internal Medicine

## 2017-06-24 VITALS — BP 132/82 | HR 69 | Temp 98.4°F | Ht 63.0 in | Wt 251.0 lb

## 2017-06-24 DIAGNOSIS — Z23 Encounter for immunization: Secondary | ICD-10-CM | POA: Diagnosis not present

## 2017-06-24 DIAGNOSIS — M159 Polyosteoarthritis, unspecified: Secondary | ICD-10-CM

## 2017-06-24 DIAGNOSIS — E538 Deficiency of other specified B group vitamins: Secondary | ICD-10-CM

## 2017-06-24 DIAGNOSIS — M150 Primary generalized (osteo)arthritis: Secondary | ICD-10-CM

## 2017-06-24 DIAGNOSIS — D582 Other hemoglobinopathies: Secondary | ICD-10-CM

## 2017-06-24 DIAGNOSIS — R531 Weakness: Secondary | ICD-10-CM

## 2017-06-24 DIAGNOSIS — E559 Vitamin D deficiency, unspecified: Secondary | ICD-10-CM | POA: Diagnosis not present

## 2017-06-24 LAB — TSH: TSH: 0.89 u[IU]/mL (ref 0.35–4.50)

## 2017-06-24 LAB — COMPREHENSIVE METABOLIC PANEL
ALBUMIN: 3.7 g/dL (ref 3.5–5.2)
ALK PHOS: 135 U/L — AB (ref 39–117)
ALT: 11 U/L (ref 0–35)
AST: 14 U/L (ref 0–37)
BUN: 5 mg/dL — ABNORMAL LOW (ref 6–23)
CALCIUM: 8.8 mg/dL (ref 8.4–10.5)
CHLORIDE: 92 meq/L — AB (ref 96–112)
CO2: 29 mEq/L (ref 19–32)
Creatinine, Ser: 0.82 mg/dL (ref 0.40–1.20)
GFR: 93.06 mL/min (ref >60.00)
Glucose, Bld: 529 mg/dL (ref 70–99)
Sodium: 129 mEq/L — ABNORMAL LOW (ref 135–145)
TOTAL PROTEIN: 7 g/dL (ref 6.0–8.3)
Total Bilirubin: 0.4 mg/dL (ref 0.2–1.2)

## 2017-06-24 LAB — VITAMIN D 25 HYDROXY (VIT D DEFICIENCY, FRACTURES): VITD: 6.74 ng/mL — AB (ref 30.00–100.00)

## 2017-06-24 LAB — CK: Total CK: 126 U/L (ref 7–177)

## 2017-06-24 LAB — VITAMIN B12: VITAMIN B 12: 658 pg/mL (ref 211–911)

## 2017-06-24 LAB — FERRITIN: Ferritin: 29.7 ng/mL (ref 10.0–291.0)

## 2017-06-24 NOTE — Assessment & Plan Note (Signed)
Checking CBC today and adjust as needed.

## 2017-06-24 NOTE — Progress Notes (Signed)
   Subjective:    Patient ID: Susan [name], female    DOB: Jan 15, 1961, 56 y.o.   MRN: [id number]  HPI The patient is a 56 YO female coming in for new problem of weakness and falls. Started in the last several months. She has not sought care for it previously. She is falling at home often. Several bruises and cuts from falling. Living with her daughter and they are helping her with bathing because she is not stable to do it. She does have some numbness in her legs from neuropathy but it feels like she is falling down sometimes after only a few steps. She denies new pain but has chronic pain in her legs. She denies change in size or color of her legs. Some pain in the low back but no new injury. She denies bowel or bladder incontinence. She denies fevers or chills. Weight is stable. Appetite is consistent. Sugars are running okay. Denies consistent low sugars although had a single 37 reading in the last month.   Review of Systems  Constitutional: Positive for activity change and appetite change. Negative for chills, fatigue, fever and unexpected weight change.  HENT: Negative.   Eyes: Negative.   Respiratory: Negative for cough, chest tightness and shortness of breath.   Cardiovascular: Negative for chest pain, palpitations and leg swelling.  Gastrointestinal: Negative for abdominal distention, abdominal pain, constipation, diarrhea, nausea and vomiting.  Musculoskeletal: Positive for arthralgias, gait problem and myalgias.  Skin: Negative.   Neurological: Positive for weakness and numbness. Negative for dizziness, tremors, seizures, syncope, speech difficulty, light-headedness and headaches.  Psychiatric/Behavioral: Negative.       Objective:   Physical Exam  Constitutional: She is oriented to person, place, and time. She appears well-developed and well-nourished.  Overweight  HENT:  Head: Normocephalic and atraumatic.  Eyes: EOM are normal.  Neck: Normal range of motion.  Cardiovascular:  Normal rate and regular rhythm.   Pulmonary/Chest: Effort normal and breath sounds normal. No respiratory distress. She has no wheezes. She has no rales.  Abdominal: Soft. Bowel sounds are normal. She exhibits no distension. There is no tenderness. There is no rebound.  Musculoskeletal: She exhibits no edema.  Neurological: She is alert and oriented to person, place, and time. Coordination abnormal.  Using cane in right hand and gait unsteady even with this.  Skin: Skin is warm and dry.  Psychiatric:  Some appropriate tearfulness   Vitals:   06/24/17 1312  BP: 132/82  Pulse: 69  Temp: 98.4 F (36.9 C)  TempSrc: Oral  SpO2: 99%  Weight: 251 lb (113.9 kg)  Height: 5\' 3"  (1.6 m)      Assessment & Plan:  Flu shot given at visit
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
## 2017-06-24 NOTE — Addendum Note (Signed)
Addended by: Raford Pitcher R on: 06/24/2017 03:22 PM   Modules accepted: Orders

## 2017-06-24 NOTE — Telephone Encounter (Signed)
CRITICAL VALUE STICKER  CRITICAL VALUE: Glucose 529  RECEIVER (on-site recipient of call): Briana  DATE & TIME NOTIFIED:  06/24/2017 3:00PM  MESSENGER (representative from lab): Hope  MD NOTIFIED: Yes  TIME OF NOTIFICATION: 3:00  RESPONSE:

## 2017-06-24 NOTE — Patient Instructions (Signed)
We have given you the flu shot today.   We have sent in the physical therapy to come to the house and will get you the walker to use as well.   We are checking the labs today.

## 2017-06-24 NOTE — Assessment & Plan Note (Signed)
Unclear etiology. She does have neuropathy in her hands and legs which could cause some decrease in sensation. She has had sudden change in the last months without cause. She is up to date on colonoscopy and pap smear. Checking labs today for etiology including thyroid, blood counts, kidneys, liver, CK. She denies low sugars often. BP at goal. She has had some adjustment of her psych medications but this was many months ago. Denies worsening symptoms there.

## 2017-06-25 NOTE — Telephone Encounter (Signed)
Noted  

## 2017-06-26 NOTE — Telephone Encounter (Signed)
Spoke to Santiago Glad in the lab. She stated that they did not get a good specimen for the CBC. They are trying to get in touch with the patient.

## 2017-06-26 NOTE — Telephone Encounter (Signed)
Spoke to Santiago Glad and she stated that the patient is coming back in on Friday to have the CBC redrawn.

## 2017-06-30 DIAGNOSIS — G629 Polyneuropathy, unspecified: Secondary | ICD-10-CM | POA: Diagnosis not present

## 2017-06-30 DIAGNOSIS — E11319 Type 2 diabetes mellitus with unspecified diabetic retinopathy without macular edema: Secondary | ICD-10-CM | POA: Diagnosis not present

## 2017-06-30 DIAGNOSIS — Z794 Long term (current) use of insulin: Secondary | ICD-10-CM | POA: Diagnosis not present

## 2017-06-30 DIAGNOSIS — D582 Other hemoglobinopathies: Secondary | ICD-10-CM | POA: Diagnosis not present

## 2017-06-30 DIAGNOSIS — E559 Vitamin D deficiency, unspecified: Secondary | ICD-10-CM | POA: Diagnosis not present

## 2017-06-30 DIAGNOSIS — M150 Primary generalized (osteo)arthritis: Secondary | ICD-10-CM | POA: Diagnosis not present

## 2017-06-30 DIAGNOSIS — I10 Essential (primary) hypertension: Secondary | ICD-10-CM | POA: Diagnosis not present

## 2017-06-30 DIAGNOSIS — R531 Weakness: Secondary | ICD-10-CM | POA: Diagnosis not present

## 2017-06-30 DIAGNOSIS — R296 Repeated falls: Secondary | ICD-10-CM | POA: Diagnosis not present

## 2017-07-01 ENCOUNTER — Telehealth: Payer: Self-pay

## 2017-07-01 ENCOUNTER — Other Ambulatory Visit: Payer: Self-pay | Admitting: Endocrinology

## 2017-07-01 NOTE — Telephone Encounter (Signed)
Routing to dr crawford---per karen/lab---patient is hard stick and there was a problem with specimen collected for CBC---lab has called patient and daughter---she finally scheduled an appt for 10/22, but has not come back in yet---just wanted to let you know, thanks

## 2017-07-02 ENCOUNTER — Other Ambulatory Visit: Payer: Self-pay | Admitting: Internal Medicine

## 2017-07-02 ENCOUNTER — Telehealth: Payer: Self-pay

## 2017-07-02 MED ORDER — VITAMIN D (ERGOCALCIFEROL) 1.25 MG (50000 UNIT) PO CAPS: 50000.0000 [IU] | ORAL_CAPSULE | ORAL | 0 refills | Status: DC

## 2017-07-02 NOTE — Telephone Encounter (Signed)
Letter and labs mailed to patient. Both phones are disconnected

## 2017-07-09 DIAGNOSIS — E11319 Type 2 diabetes mellitus with unspecified diabetic retinopathy without macular edema: Secondary | ICD-10-CM | POA: Diagnosis not present

## 2017-07-09 DIAGNOSIS — E559 Vitamin D deficiency, unspecified: Secondary | ICD-10-CM | POA: Diagnosis not present

## 2017-07-09 DIAGNOSIS — Z794 Long term (current) use of insulin: Secondary | ICD-10-CM | POA: Diagnosis not present

## 2017-07-09 DIAGNOSIS — G629 Polyneuropathy, unspecified: Secondary | ICD-10-CM | POA: Diagnosis not present

## 2017-07-09 DIAGNOSIS — I10 Essential (primary) hypertension: Secondary | ICD-10-CM | POA: Diagnosis not present

## 2017-07-09 DIAGNOSIS — D582 Other hemoglobinopathies: Secondary | ICD-10-CM | POA: Diagnosis not present

## 2017-07-09 DIAGNOSIS — R531 Weakness: Secondary | ICD-10-CM | POA: Diagnosis not present

## 2017-07-09 DIAGNOSIS — M150 Primary generalized (osteo)arthritis: Secondary | ICD-10-CM | POA: Diagnosis not present

## 2017-07-09 DIAGNOSIS — R296 Repeated falls: Secondary | ICD-10-CM | POA: Diagnosis not present

## 2017-07-10 DIAGNOSIS — D582 Other hemoglobinopathies: Secondary | ICD-10-CM | POA: Diagnosis not present

## 2017-07-10 DIAGNOSIS — R531 Weakness: Secondary | ICD-10-CM | POA: Diagnosis not present

## 2017-07-10 DIAGNOSIS — I10 Essential (primary) hypertension: Secondary | ICD-10-CM | POA: Diagnosis not present

## 2017-07-10 DIAGNOSIS — G629 Polyneuropathy, unspecified: Secondary | ICD-10-CM | POA: Diagnosis not present

## 2017-07-10 DIAGNOSIS — E559 Vitamin D deficiency, unspecified: Secondary | ICD-10-CM | POA: Diagnosis not present

## 2017-07-10 DIAGNOSIS — Z794 Long term (current) use of insulin: Secondary | ICD-10-CM | POA: Diagnosis not present

## 2017-07-10 DIAGNOSIS — R296 Repeated falls: Secondary | ICD-10-CM | POA: Diagnosis not present

## 2017-07-10 DIAGNOSIS — E11319 Type 2 diabetes mellitus with unspecified diabetic retinopathy without macular edema: Secondary | ICD-10-CM | POA: Diagnosis not present

## 2017-07-10 DIAGNOSIS — M150 Primary generalized (osteo)arthritis: Secondary | ICD-10-CM | POA: Diagnosis not present

## 2017-07-11 ENCOUNTER — Ambulatory Visit: Payer: Self-pay | Admitting: Internal Medicine

## 2017-07-12 DIAGNOSIS — E559 Vitamin D deficiency, unspecified: Secondary | ICD-10-CM | POA: Diagnosis not present

## 2017-07-12 DIAGNOSIS — M150 Primary generalized (osteo)arthritis: Secondary | ICD-10-CM | POA: Diagnosis not present

## 2017-07-12 DIAGNOSIS — D582 Other hemoglobinopathies: Secondary | ICD-10-CM | POA: Diagnosis not present

## 2017-07-12 DIAGNOSIS — Z794 Long term (current) use of insulin: Secondary | ICD-10-CM | POA: Diagnosis not present

## 2017-07-12 DIAGNOSIS — I10 Essential (primary) hypertension: Secondary | ICD-10-CM | POA: Diagnosis not present

## 2017-07-12 DIAGNOSIS — E11319 Type 2 diabetes mellitus with unspecified diabetic retinopathy without macular edema: Secondary | ICD-10-CM | POA: Diagnosis not present

## 2017-07-12 DIAGNOSIS — R296 Repeated falls: Secondary | ICD-10-CM | POA: Diagnosis not present

## 2017-07-12 DIAGNOSIS — R531 Weakness: Secondary | ICD-10-CM | POA: Diagnosis not present

## 2017-07-12 DIAGNOSIS — G629 Polyneuropathy, unspecified: Secondary | ICD-10-CM | POA: Diagnosis not present

## 2017-07-16 DIAGNOSIS — R296 Repeated falls: Secondary | ICD-10-CM | POA: Diagnosis not present

## 2017-07-16 DIAGNOSIS — G629 Polyneuropathy, unspecified: Secondary | ICD-10-CM | POA: Diagnosis not present

## 2017-07-16 DIAGNOSIS — D582 Other hemoglobinopathies: Secondary | ICD-10-CM | POA: Diagnosis not present

## 2017-07-16 DIAGNOSIS — E559 Vitamin D deficiency, unspecified: Secondary | ICD-10-CM | POA: Diagnosis not present

## 2017-07-16 DIAGNOSIS — E11319 Type 2 diabetes mellitus with unspecified diabetic retinopathy without macular edema: Secondary | ICD-10-CM | POA: Diagnosis not present

## 2017-07-16 DIAGNOSIS — R531 Weakness: Secondary | ICD-10-CM | POA: Diagnosis not present

## 2017-07-16 DIAGNOSIS — Z794 Long term (current) use of insulin: Secondary | ICD-10-CM | POA: Diagnosis not present

## 2017-07-16 DIAGNOSIS — M150 Primary generalized (osteo)arthritis: Secondary | ICD-10-CM | POA: Diagnosis not present

## 2017-07-16 DIAGNOSIS — I10 Essential (primary) hypertension: Secondary | ICD-10-CM | POA: Diagnosis not present

## 2017-07-17 ENCOUNTER — Telehealth: Payer: Self-pay | Admitting: Internal Medicine

## 2017-07-17 DIAGNOSIS — E11319 Type 2 diabetes mellitus with unspecified diabetic retinopathy without macular edema: Secondary | ICD-10-CM | POA: Diagnosis not present

## 2017-07-17 DIAGNOSIS — Z794 Long term (current) use of insulin: Secondary | ICD-10-CM | POA: Diagnosis not present

## 2017-07-17 DIAGNOSIS — R296 Repeated falls: Secondary | ICD-10-CM | POA: Diagnosis not present

## 2017-07-17 DIAGNOSIS — G629 Polyneuropathy, unspecified: Secondary | ICD-10-CM | POA: Diagnosis not present

## 2017-07-17 DIAGNOSIS — I10 Essential (primary) hypertension: Secondary | ICD-10-CM | POA: Diagnosis not present

## 2017-07-17 DIAGNOSIS — D582 Other hemoglobinopathies: Secondary | ICD-10-CM | POA: Diagnosis not present

## 2017-07-17 DIAGNOSIS — M150 Primary generalized (osteo)arthritis: Secondary | ICD-10-CM | POA: Diagnosis not present

## 2017-07-17 DIAGNOSIS — E559 Vitamin D deficiency, unspecified: Secondary | ICD-10-CM | POA: Diagnosis not present

## 2017-07-17 DIAGNOSIS — R531 Weakness: Secondary | ICD-10-CM | POA: Diagnosis not present

## 2017-07-17 NOTE — Telephone Encounter (Signed)
1 x a week for one week and two x a week for three weeks for PT.  States patient did have a fall Saturday night with no injuries.  Blood sugar is low.  Patient has not gotten walker yet b/c she does not have copay until the first of next month.  Working on getting patient a Social research officer, government.

## 2017-07-18 NOTE — Telephone Encounter (Signed)
Fine

## 2017-07-19 DIAGNOSIS — D582 Other hemoglobinopathies: Secondary | ICD-10-CM | POA: Diagnosis not present

## 2017-07-19 DIAGNOSIS — M150 Primary generalized (osteo)arthritis: Secondary | ICD-10-CM | POA: Diagnosis not present

## 2017-07-19 DIAGNOSIS — E559 Vitamin D deficiency, unspecified: Secondary | ICD-10-CM | POA: Diagnosis not present

## 2017-07-19 DIAGNOSIS — Z794 Long term (current) use of insulin: Secondary | ICD-10-CM | POA: Diagnosis not present

## 2017-07-19 DIAGNOSIS — R531 Weakness: Secondary | ICD-10-CM | POA: Diagnosis not present

## 2017-07-19 DIAGNOSIS — G629 Polyneuropathy, unspecified: Secondary | ICD-10-CM | POA: Diagnosis not present

## 2017-07-19 DIAGNOSIS — E11319 Type 2 diabetes mellitus with unspecified diabetic retinopathy without macular edema: Secondary | ICD-10-CM | POA: Diagnosis not present

## 2017-07-19 DIAGNOSIS — I10 Essential (primary) hypertension: Secondary | ICD-10-CM | POA: Diagnosis not present

## 2017-07-19 DIAGNOSIS — R296 Repeated falls: Secondary | ICD-10-CM | POA: Diagnosis not present

## 2017-07-19 NOTE — Telephone Encounter (Signed)
Notified Geraline w/MD response...Johny Chess

## 2017-07-23 DIAGNOSIS — E559 Vitamin D deficiency, unspecified: Secondary | ICD-10-CM | POA: Diagnosis not present

## 2017-07-23 DIAGNOSIS — Z794 Long term (current) use of insulin: Secondary | ICD-10-CM | POA: Diagnosis not present

## 2017-07-23 DIAGNOSIS — R296 Repeated falls: Secondary | ICD-10-CM | POA: Diagnosis not present

## 2017-07-23 DIAGNOSIS — E11319 Type 2 diabetes mellitus with unspecified diabetic retinopathy without macular edema: Secondary | ICD-10-CM | POA: Diagnosis not present

## 2017-07-23 DIAGNOSIS — G629 Polyneuropathy, unspecified: Secondary | ICD-10-CM | POA: Diagnosis not present

## 2017-07-23 DIAGNOSIS — R531 Weakness: Secondary | ICD-10-CM | POA: Diagnosis not present

## 2017-07-23 DIAGNOSIS — D582 Other hemoglobinopathies: Secondary | ICD-10-CM | POA: Diagnosis not present

## 2017-07-23 DIAGNOSIS — I10 Essential (primary) hypertension: Secondary | ICD-10-CM | POA: Diagnosis not present

## 2017-07-23 DIAGNOSIS — M150 Primary generalized (osteo)arthritis: Secondary | ICD-10-CM | POA: Diagnosis not present

## 2017-07-24 DIAGNOSIS — Z794 Long term (current) use of insulin: Secondary | ICD-10-CM | POA: Diagnosis not present

## 2017-07-24 DIAGNOSIS — I10 Essential (primary) hypertension: Secondary | ICD-10-CM | POA: Diagnosis not present

## 2017-07-24 DIAGNOSIS — G629 Polyneuropathy, unspecified: Secondary | ICD-10-CM | POA: Diagnosis not present

## 2017-07-24 DIAGNOSIS — E559 Vitamin D deficiency, unspecified: Secondary | ICD-10-CM | POA: Diagnosis not present

## 2017-07-24 DIAGNOSIS — M150 Primary generalized (osteo)arthritis: Secondary | ICD-10-CM | POA: Diagnosis not present

## 2017-07-24 DIAGNOSIS — R531 Weakness: Secondary | ICD-10-CM | POA: Diagnosis not present

## 2017-07-24 DIAGNOSIS — R296 Repeated falls: Secondary | ICD-10-CM | POA: Diagnosis not present

## 2017-07-24 DIAGNOSIS — D582 Other hemoglobinopathies: Secondary | ICD-10-CM | POA: Diagnosis not present

## 2017-07-24 DIAGNOSIS — E11319 Type 2 diabetes mellitus with unspecified diabetic retinopathy without macular edema: Secondary | ICD-10-CM | POA: Diagnosis not present

## 2017-07-30 DIAGNOSIS — F1220 Cannabis dependence, uncomplicated: Secondary | ICD-10-CM | POA: Diagnosis not present

## 2017-07-30 DIAGNOSIS — I10 Essential (primary) hypertension: Secondary | ICD-10-CM | POA: Diagnosis not present

## 2017-07-30 DIAGNOSIS — F333 Major depressive disorder, recurrent, severe with psychotic symptoms: Secondary | ICD-10-CM | POA: Diagnosis not present

## 2017-07-30 DIAGNOSIS — R531 Weakness: Secondary | ICD-10-CM | POA: Diagnosis not present

## 2017-07-30 DIAGNOSIS — M150 Primary generalized (osteo)arthritis: Secondary | ICD-10-CM | POA: Diagnosis not present

## 2017-07-30 DIAGNOSIS — D582 Other hemoglobinopathies: Secondary | ICD-10-CM | POA: Diagnosis not present

## 2017-07-30 DIAGNOSIS — F17210 Nicotine dependence, cigarettes, uncomplicated: Secondary | ICD-10-CM | POA: Diagnosis not present

## 2017-07-30 DIAGNOSIS — R296 Repeated falls: Secondary | ICD-10-CM | POA: Diagnosis not present

## 2017-07-30 DIAGNOSIS — G629 Polyneuropathy, unspecified: Secondary | ICD-10-CM | POA: Diagnosis not present

## 2017-07-30 DIAGNOSIS — F4310 Post-traumatic stress disorder, unspecified: Secondary | ICD-10-CM | POA: Diagnosis not present

## 2017-07-30 DIAGNOSIS — E11319 Type 2 diabetes mellitus with unspecified diabetic retinopathy without macular edema: Secondary | ICD-10-CM | POA: Diagnosis not present

## 2017-07-30 DIAGNOSIS — B20 Human immunodeficiency virus [HIV] disease: Secondary | ICD-10-CM | POA: Diagnosis not present

## 2017-07-31 ENCOUNTER — Other Ambulatory Visit: Payer: Self-pay | Admitting: Internal Medicine

## 2017-07-31 NOTE — Telephone Encounter (Signed)
Relation to PY:YFRT Call back number:[phone] Pharmacy:  [facility], [place] [phone] (Phone) [phone] (Fax)     Reason for call:   Patient checking on the status of medication refill request, patient states she will run out today, please advise

## 2017-07-31 NOTE — Telephone Encounter (Signed)
See message below °

## 2017-08-01 DIAGNOSIS — G629 Polyneuropathy, unspecified: Secondary | ICD-10-CM | POA: Diagnosis not present

## 2017-08-01 DIAGNOSIS — Z794 Long term (current) use of insulin: Secondary | ICD-10-CM | POA: Diagnosis not present

## 2017-08-01 DIAGNOSIS — R531 Weakness: Secondary | ICD-10-CM | POA: Diagnosis not present

## 2017-08-01 DIAGNOSIS — I10 Essential (primary) hypertension: Secondary | ICD-10-CM | POA: Diagnosis not present

## 2017-08-01 DIAGNOSIS — R296 Repeated falls: Secondary | ICD-10-CM | POA: Diagnosis not present

## 2017-08-01 DIAGNOSIS — D582 Other hemoglobinopathies: Secondary | ICD-10-CM | POA: Diagnosis not present

## 2017-08-01 DIAGNOSIS — E559 Vitamin D deficiency, unspecified: Secondary | ICD-10-CM | POA: Diagnosis not present

## 2017-08-01 DIAGNOSIS — M150 Primary generalized (osteo)arthritis: Secondary | ICD-10-CM | POA: Diagnosis not present

## 2017-08-01 DIAGNOSIS — E11319 Type 2 diabetes mellitus with unspecified diabetic retinopathy without macular edema: Secondary | ICD-10-CM | POA: Diagnosis not present

## 2017-08-07 DIAGNOSIS — R531 Weakness: Secondary | ICD-10-CM | POA: Diagnosis not present

## 2017-08-07 DIAGNOSIS — E559 Vitamin D deficiency, unspecified: Secondary | ICD-10-CM | POA: Diagnosis not present

## 2017-08-07 DIAGNOSIS — R296 Repeated falls: Secondary | ICD-10-CM | POA: Diagnosis not present

## 2017-08-07 DIAGNOSIS — D582 Other hemoglobinopathies: Secondary | ICD-10-CM | POA: Diagnosis not present

## 2017-08-07 DIAGNOSIS — E11319 Type 2 diabetes mellitus with unspecified diabetic retinopathy without macular edema: Secondary | ICD-10-CM | POA: Diagnosis not present

## 2017-08-07 DIAGNOSIS — I10 Essential (primary) hypertension: Secondary | ICD-10-CM | POA: Diagnosis not present

## 2017-08-07 DIAGNOSIS — Z794 Long term (current) use of insulin: Secondary | ICD-10-CM | POA: Diagnosis not present

## 2017-08-07 DIAGNOSIS — G629 Polyneuropathy, unspecified: Secondary | ICD-10-CM | POA: Diagnosis not present

## 2017-08-07 DIAGNOSIS — M150 Primary generalized (osteo)arthritis: Secondary | ICD-10-CM | POA: Diagnosis not present

## 2017-08-13 ENCOUNTER — Ambulatory Visit: Payer: Self-pay | Admitting: Endocrinology

## 2017-08-14 ENCOUNTER — Ambulatory Visit: Payer: Self-pay | Admitting: Internal Medicine

## 2017-08-16 ENCOUNTER — Ambulatory Visit: Payer: Self-pay | Admitting: Internal Medicine

## 2017-08-21 ENCOUNTER — Ambulatory Visit (INDEPENDENT_AMBULATORY_CARE_PROVIDER_SITE_OTHER): Payer: Medicare Other | Admitting: Internal Medicine

## 2017-08-21 ENCOUNTER — Encounter: Payer: Self-pay | Admitting: Internal Medicine

## 2017-08-21 DIAGNOSIS — E785 Hyperlipidemia, unspecified: Secondary | ICD-10-CM | POA: Diagnosis not present

## 2017-08-21 DIAGNOSIS — F17200 Nicotine dependence, unspecified, uncomplicated: Secondary | ICD-10-CM

## 2017-08-21 DIAGNOSIS — Z0000 Encounter for general adult medical examination without abnormal findings: Secondary | ICD-10-CM | POA: Diagnosis not present

## 2017-08-21 DIAGNOSIS — B20 Human immunodeficiency virus [HIV] disease: Secondary | ICD-10-CM

## 2017-08-21 MED ORDER — DOXYCYCLINE HYCLATE 100 MG PO TABS: 100.0000 mg | ORAL_TABLET | Freq: Two times a day (BID) | ORAL | 0 refills | Status: DC

## 2017-08-21 MED ORDER — GUAIFENESIN-DM 100-10 MG/5ML PO SYRP: 5.0000 mL | ORAL_SOLUTION | ORAL | 0 refills | Status: DC | PRN

## 2017-08-21 NOTE — Progress Notes (Signed)
   Subjective:    Patient ID: Susan [name], female    DOB: Oct 10, 1960, 56 y.o.   MRN: [id number]  HPI Here for medicare wellness and physical, no new complaints. Please see A/P for status and treatment of chronic medical problems.   Diet: heart healthy Physical activity: sedentary Depression/mood screen: negative Hearing: intact to whispered voice Visual acuity: grossly normal, performs annual eye exam  ADLs: capable Fall risk: medium Home safety: good Cognitive evaluation: intact to orientation, naming, recall and repetition, some difficulty with higher level thinking EOL planning: adv directives discussed  I have personally reviewed and have noted 1. The patient's medical and social history - reviewed today no changes 2. Their use of alcohol, tobacco or illicit drugs 3. Their current medications and supplements 4. The patient's functional ability including ADL's, fall risks, home safety risks and hearing or visual impairment. 5. Diet and physical activities 6. Evidence for depression or mood disorders 7. Care team reviewed and updated (available in snapshot)  Review of Systems  Constitutional: Negative.   HENT: Negative.   Eyes: Negative.   Respiratory: Negative for cough, chest tightness and shortness of breath.   Cardiovascular: Negative for chest pain, palpitations and leg swelling.  Gastrointestinal: Negative for abdominal distention, abdominal pain, constipation, diarrhea, nausea and vomiting.  Musculoskeletal: Negative.   Skin: Negative.   Neurological: Negative.   Psychiatric/Behavioral: Negative.       Objective:   Physical Exam  Constitutional: She is oriented to person, place, and time. She appears well-developed and well-nourished.  Overweight  HENT:  Head: Normocephalic and atraumatic.  Eyes: EOM are normal.  Neck: Normal range of motion.  Cardiovascular: Normal rate and regular rhythm.  Pulmonary/Chest: Effort normal and breath sounds normal. No  respiratory distress. She has no wheezes. She has no rales.  Abdominal: Soft. Bowel sounds are normal. She exhibits no distension. There is no tenderness. There is no rebound.  Musculoskeletal: She exhibits no edema.  Neurological: She is alert and oriented to person, place, and time. Coordination normal.  Skin: Skin is warm and dry.  Psychiatric: She has a normal mood and affect.   Vitals:   08/21/17 1038  BP: 130/70  Pulse: 71  Temp: 98.3 F (36.8 C)  TempSrc: Oral  SpO2: 98%  Weight: 252 lb (114.3 kg)  Height: 5\' 3"  (1.6 m)      Assessment & Plan:

## 2017-08-21 NOTE — Patient Instructions (Addendum)
We have sent in doxycycline to help, take 1 pill twice a day for 1 week.   We have also sent in some cough medicine to help with the cough.   Health Maintenance, Female Adopting a healthy lifestyle and getting preventive care can go a long way to promote health and wellness. Talk with your health care provider about what schedule of regular examinations is right for you. This is a good chance for you to check in with your provider about disease prevention and staying healthy. In between checkups, there are plenty of things you can do on your own. Experts have done a lot of research about which lifestyle changes and preventive measures are most likely to keep you healthy. Ask your health care provider for more information. Weight and diet Eat a healthy diet  Be sure to include plenty of vegetables, fruits, low-fat dairy products, and lean protein.  Do not eat a lot of foods high in solid fats, added sugars, or salt.  Get regular exercise. This is one of the most important things you can do for your health. ? Most adults should exercise for at least 150 minutes each week. The exercise should increase your heart rate and make you sweat (moderate-intensity exercise). ? Most adults should also do strengthening exercises at least twice a week. This is in addition to the moderate-intensity exercise.  Maintain a healthy weight  Body mass index (BMI) is a measurement that can be used to identify possible weight problems. It estimates body fat based on height and weight. Your health care provider can help determine your BMI and help you achieve or maintain a healthy weight.  For females 64 years of age and older: ? A BMI below 18.5 is considered underweight. ? A BMI of 18.5 to 24.9 is normal. ? A BMI of 25 to 29.9 is considered overweight. ? A BMI of 30 and above is considered obese.  Watch levels of cholesterol and blood lipids  You should start having your blood tested for lipids and  cholesterol at 55 years of age, then have this test every 5 years.  You may need to have your cholesterol levels checked more often if: ? Your lipid or cholesterol levels are high. ? You are older than 55 years of age. ? You are at high risk for heart disease.  Cancer screening Lung Cancer  Lung cancer screening is recommended for adults 30-51 years old who are at high risk for lung cancer because of a history of smoking.  A yearly low-dose CT scan of the lungs is recommended for people who: ? Currently smoke. ? Have quit within the past 15 years. ? Have at least a 30-pack-year history of smoking. A pack year is smoking an average of one pack of cigarettes a day for 1 year.  Yearly screening should continue until it has been 15 years since you quit.  Yearly screening should stop if you develop a health problem that would prevent you from having lung cancer treatment.  Breast Cancer  Practice breast self-awareness. This means understanding how your breasts normally appear and feel.  It also means doing regular breast self-exams. Let your health care provider know about any changes, no matter how small.  If you are in your 20s or 30s, you should have a clinical breast exam (CBE) by a health care provider every 1-3 years as part of a regular health exam.  If you are 24 or older, have a CBE every year. Also consider  having a breast X-ray (mammogram) every year.  If you have a family history of breast cancer, talk to your health care provider about genetic screening.  If you are at high risk for breast cancer, talk to your health care provider about having an MRI and a mammogram every year.  Breast cancer gene (BRCA) assessment is recommended for women who have family members with BRCA-related cancers. BRCA-related cancers include: ? Breast. ? Ovarian. ? Tubal. ? Peritoneal cancers.  Results of the assessment will determine the need for genetic counseling and BRCA1 and BRCA2  testing.  Cervical Cancer Your health care provider may recommend that you be screened regularly for cancer of the pelvic organs (ovaries, uterus, and vagina). This screening involves a pelvic examination, including checking for microscopic changes to the surface of your cervix (Pap test). You may be encouraged to have this screening done every 3 years, beginning at age 78.  For women ages 58-65, health care providers may recommend pelvic exams and Pap testing every 3 years, or they may recommend the Pap and pelvic exam, combined with testing for human papilloma virus (HPV), every 5 years. Some types of HPV increase your risk of cervical cancer. Testing for HPV may also be done on women of any age with unclear Pap test results.  Other health care providers may not recommend any screening for nonpregnant women who are considered low risk for pelvic cancer and who do not have symptoms. Ask your health care provider if a screening pelvic exam is right for you.  If you have had past treatment for cervical cancer or a condition that could lead to cancer, you need Pap tests and screening for cancer for at least 20 years after your treatment. If Pap tests have been discontinued, your risk factors (such as having a new sexual partner) need to be reassessed to determine if screening should resume. Some women have medical problems that increase the chance of getting cervical cancer. In these cases, your health care provider may recommend more frequent screening and Pap tests.  Colorectal Cancer  This type of cancer can be detected and often prevented.  Routine colorectal cancer screening usually begins at 55 years of age and continues through 55 years of age.  Your health care provider may recommend screening at an earlier age if you have risk factors for colon cancer.  Your health care provider may also recommend using home test kits to check for hidden blood in the stool.  A small camera at the end of a  tube can be used to examine your colon directly (sigmoidoscopy or colonoscopy). This is done to check for the earliest forms of colorectal cancer.  Routine screening usually begins at age 25.  Direct examination of the colon should be repeated every 5-10 years through 55 years of age. However, you may need to be screened more often if early forms of precancerous polyps or small growths are found.  Skin Cancer  Check your skin from head to toe regularly.  Tell your health care provider about any new moles or changes in moles, especially if there is a change in a mole's shape or color.  Also tell your health care provider if you have a mole that is larger than the size of a pencil eraser.  Always use sunscreen. Apply sunscreen liberally and repeatedly throughout the day.  Protect yourself by wearing long sleeves, pants, a wide-brimmed hat, and sunglasses whenever you are outside.  Heart disease, diabetes, and high blood pressure  High blood pressure causes heart disease and increases the risk of stroke. High blood pressure is more likely to develop in: ? People who have blood pressure in the high end of the normal range (130-139/85-89 mm Hg). ? People who are overweight or obese. ? People who are African American.  If you are 97-43 years of age, have your blood pressure checked every 3-5 years. If you are 34 years of age or older, have your blood pressure checked every year. You should have your blood pressure measured twice-once when you are at a hospital or clinic, and once when you are not at a hospital or clinic. Record the average of the two measurements. To check your blood pressure when you are not at a hospital or clinic, you can use: ? An automated blood pressure machine at a pharmacy. ? A home blood pressure monitor.  If you are between 39 years and 85 years old, ask your health care provider if you should take aspirin to prevent strokes.  Have regular diabetes screenings. This  involves taking a blood sample to check your fasting blood sugar level. ? If you are at a normal weight and have a low risk for diabetes, have this test once every three years after 55 years of age. ? If you are overweight and have a high risk for diabetes, consider being tested at a younger age or more often. Preventing infection Hepatitis B  If you have a higher risk for hepatitis B, you should be screened for this virus. You are considered at high risk for hepatitis B if: ? You were born in a country where hepatitis B is common. Ask your health care provider which countries are considered high risk. ? Your parents were born in a high-risk country, and you have not been immunized against hepatitis B (hepatitis B vaccine). ? You have HIV or AIDS. ? You use needles to inject street drugs. ? You live with someone who has hepatitis B. ? You have had sex with someone who has hepatitis B. ? You get hemodialysis treatment. ? You take certain medicines for conditions, including cancer, organ transplantation, and autoimmune conditions.  Hepatitis C  Blood testing is recommended for: ? Everyone born from [birth year] through 1964. ? Anyone with known risk factors for hepatitis C.  Sexually transmitted infections (STIs)  You should be screened for sexually transmitted infections (STIs) including gonorrhea and chlamydia if: ? You are sexually active and are younger than 55 years of age. ? You are older than 55 years of age and your health care provider tells you that you are at risk for this type of infection. ? Your sexual activity has changed since you were last screened and you are at an increased risk for chlamydia or gonorrhea. Ask your health care provider if you are at risk.  If you do not have HIV, but are at risk, it may be recommended that you take a prescription medicine daily to prevent HIV infection. This is called pre-exposure prophylaxis (PrEP). You are considered at risk if: ? You are  sexually active and do not regularly use condoms or know the HIV status of your partner(s). ? You take drugs by injection. ? You are sexually active with a partner who has HIV.  Talk with your health care provider about whether you are at high risk of being infected with HIV. If you choose to begin PrEP, you should first be tested for HIV. You should then be tested every 3 months  for as long as you are taking PrEP. Pregnancy  If you are premenopausal and you may become pregnant, ask your health care provider about preconception counseling.  If you may become pregnant, take 400 to 800 micrograms (mcg) of folic acid every day.  If you want to prevent pregnancy, talk to your health care provider about birth control (contraception). Osteoporosis and menopause  Osteoporosis is a disease in which the bones lose minerals and strength with aging. This can result in serious bone fractures. Your risk for osteoporosis can be identified using a bone density scan.  If you are 29 years of age or older, or if you are at risk for osteoporosis and fractures, ask your health care provider if you should be screened.  Ask your health care provider whether you should take a calcium or vitamin D supplement to lower your risk for osteoporosis.  Menopause may have certain physical symptoms and risks.  Hormone replacement therapy may reduce some of these symptoms and risks. Talk to your health care provider about whether hormone replacement therapy is right for you. Follow these instructions at home:  Schedule regular health, dental, and eye exams.  Stay current with your immunizations.  Do not use any tobacco products including cigarettes, chewing tobacco, or electronic cigarettes.  If you are pregnant, do not drink alcohol.  If you are breastfeeding, limit how much and how often you drink alcohol.  Limit alcohol intake to no more than 1 drink per day for nonpregnant women. One drink equals 12 ounces of  beer, 5 ounces of wine, or 1 ounces of hard liquor.  Do not use street drugs.  Do not share needles.  Ask your health care provider for help if you need support or information about quitting drugs.  Tell your health care provider if you often feel depressed.  Tell your health care provider if you have ever been abused or do not feel safe at home. This information is not intended to replace advice given to you by your health care provider. Make sure you discuss any questions you have with your health care provider. Document Released: 03/05/2011 Document Revised: 01/26/2016 Document Reviewed: 05/24/2015 Elsevier Interactive Patient Education  [employer].

## 2017-08-23 ENCOUNTER — Other Ambulatory Visit: Payer: Self-pay | Admitting: Endocrinology

## 2017-08-23 DIAGNOSIS — Z7189 Other specified counseling: Secondary | ICD-10-CM | POA: Insufficient documentation

## 2017-08-23 DIAGNOSIS — Z0000 Encounter for general adult medical examination without abnormal findings: Secondary | ICD-10-CM | POA: Insufficient documentation

## 2017-08-23 NOTE — Assessment & Plan Note (Signed)
Mammogram due, colonoscopy up to date. Flu and tetanus and pneumonia up to date. Pap smear up to date with gyn for some bleeding issues.

## 2017-08-23 NOTE — Assessment & Plan Note (Signed)
Lipids last are at goal, crestor 20 mg daily and no adjustment today.

## 2017-08-23 NOTE — Assessment & Plan Note (Signed)
BMI 44 which is limiting her everyday life and activities. She has fallen several times and requires assistance to get up. Talked with her about diet strategies to help.

## 2017-08-23 NOTE — Assessment & Plan Note (Signed)
Seeing ID and stable although last viral load was detected but low.

## 2017-08-23 NOTE — Assessment & Plan Note (Signed)
Time spent counseling about tobacco usage: 3 minutes. I have asked about smoking and is smoking same as usual. The patient is advised to quit. The patient is not willing to quit. They would like to talk about trying to quit in the next 6 months. We will follow up with them in 6 months.

## 2017-09-06 ENCOUNTER — Other Ambulatory Visit: Payer: Self-pay

## 2017-09-06 ENCOUNTER — Telehealth: Payer: Self-pay | Admitting: Endocrinology

## 2017-09-06 MED ORDER — INSULIN ISOPHANE HUMAN 100 UNIT/ML KWIKPEN: PEN_INJECTOR | SUBCUTANEOUS | 0 refills | Status: DC

## 2017-09-06 MED ORDER — GLUCOSE BLOOD VI STRP: ORAL_STRIP | 12 refills | Status: DC

## 2017-09-06 NOTE — Telephone Encounter (Signed)
I called patient & sent over test strips plus needed refill on insulin.

## 2017-09-06 NOTE — Telephone Encounter (Signed)
Pt stated she need more test strips but she also needs to speak to someone about her meter Please advise    [facility] [place] [address]

## 2017-09-09 ENCOUNTER — Other Ambulatory Visit: Payer: Self-pay | Admitting: Internal Medicine

## 2017-09-11 ENCOUNTER — Telehealth: Payer: Self-pay

## 2017-09-11 NOTE — Telephone Encounter (Signed)
LVM for patient to call back and let us know if she wants her RX's to be refilled at [facility] or [facility]. We received a fax from [facility] requesting refills and that pharmacy is in on file so wanted to be sure.

## 2017-09-13 ENCOUNTER — Ambulatory Visit (INDEPENDENT_AMBULATORY_CARE_PROVIDER_SITE_OTHER): Payer: Medicare Other | Admitting: Endocrinology

## 2017-09-13 ENCOUNTER — Encounter: Payer: Self-pay | Admitting: Endocrinology

## 2017-09-13 VITALS — BP 138/90 | HR 88 | Temp 98.8°F | Wt 252.0 lb

## 2017-09-13 DIAGNOSIS — E119 Type 2 diabetes mellitus without complications: Secondary | ICD-10-CM

## 2017-09-13 LAB — POCT GLYCOSYLATED HEMOGLOBIN (HGB A1C): HEMOGLOBIN A1C: 8.7

## 2017-09-13 MED ORDER — INSULIN ISOPHANE HUMAN 100 UNIT/ML KWIKPEN: 90.0000 [IU] | PEN_INJECTOR | SUBCUTANEOUS | 0 refills | Status: DC

## 2017-09-13 MED ORDER — GLUCOSE BLOOD VI STRP
1.0000 | ORAL_STRIP | Freq: Two times a day (BID) | 3 refills | Status: DC
Start: 2017-09-13 — End: 2018-09-24
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
## 2017-09-13 NOTE — Patient Instructions (Addendum)
Please increase the insulin to 90 units each morning. On this type of insulin schedule, you should eat meals on a regular schedule.  If a meal is missed or significantly delayed, your blood sugar could go low.    check your blood sugar twice a day.  vary the time of day when you check, between before the 3 meals, and at bedtime.  also check if you have symptoms of your blood sugar being too high or too low.  please keep a record of the readings and bring it to your next appointment here (or you can bring the meter itself).  You can write it on any piece of paper.  please call us sooner if your blood sugar goes below 70, or if you have a lot of readings over 200. Please come back for a follow-up appointment in 2 months.

## 2017-09-24 ENCOUNTER — Other Ambulatory Visit: Payer: Self-pay | Admitting: Internal Medicine

## 2017-10-29 ENCOUNTER — Telehealth: Payer: Self-pay

## 2017-10-29 NOTE — Telephone Encounter (Signed)
Called patient and she states all her diabetes medication and supplies come from [facility] pharmacy she has on file

## 2017-10-29 NOTE — Telephone Encounter (Signed)
Copied from [place]. Topic: General - Other >> Oct 29, 2017  9:09 AM Valla Leaver wrote: Reason for CRM: Manuella Ghazi, w/ [facility] inquiring about whether or not the fax request for diabetic testing supplies was received?  >> Oct 29, 2017 10:09 AM Lonia Skinner [place] A wrote: Are you aware of this? Do you have the form?

## 2017-11-05 ENCOUNTER — Other Ambulatory Visit: Payer: Self-pay

## 2017-11-05 NOTE — Addendum Note (Signed)
Addended byMeriel Pica F on: 11/05/2017 08:21 AM   Modules accepted: Orders

## 2017-11-11 ENCOUNTER — Ambulatory Visit (INDEPENDENT_AMBULATORY_CARE_PROVIDER_SITE_OTHER): Payer: Medicare Other | Admitting: Endocrinology

## 2017-11-11 ENCOUNTER — Encounter: Payer: Self-pay | Admitting: Endocrinology

## 2017-11-11 VITALS — BP 122/60 | HR 85 | Wt 257.2 lb

## 2017-11-11 DIAGNOSIS — E119 Type 2 diabetes mellitus without complications: Secondary | ICD-10-CM | POA: Diagnosis not present

## 2017-11-11 LAB — POCT GLYCOSYLATED HEMOGLOBIN (HGB A1C): Hemoglobin A1C: 9.6

## 2017-11-11 MED ORDER — INSULIN ISOPHANE HUMAN 100 UNIT/ML KWIKPEN: 110.0000 [IU] | PEN_INJECTOR | SUBCUTANEOUS | 0 refills | Status: DC
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
## 2017-11-11 NOTE — Patient Instructions (Addendum)
Please increase the insulin to 110 units each morning. On this type of insulin schedule, you should eat meals on a regular schedule.  If a meal is missed or significantly delayed, your blood sugar could go low.    check your blood sugar twice a day.  vary the time of day when you check, between before the 3 meals, and at bedtime.  also check if you have symptoms of your blood sugar being too high or too low.  please keep a record of the readings and bring it to your next appointment here (or you can bring the meter itself).  You can write it on any piece of paper.  please call us sooner if your blood sugar goes below 70, or if you have a lot of readings over 200. Please come back for a follow-up appointment in 2 months.

## 2017-11-13 ENCOUNTER — Other Ambulatory Visit: Payer: Medicare Other

## 2017-11-13 DIAGNOSIS — B20 Human immunodeficiency virus [HIV] disease: Secondary | ICD-10-CM

## 2017-11-14 LAB — T-HELPER CELL (CD4) - (RCID CLINIC ONLY)
CD4 % Helper T Cell: 22 % — ABNORMAL LOW (ref 33–55)
CD4 T Cell Abs: 630 /uL (ref 400–2700)

## 2017-11-15 ENCOUNTER — Other Ambulatory Visit: Payer: Self-pay | Admitting: Internal Medicine

## 2017-11-18 ENCOUNTER — Telehealth: Payer: Self-pay | Admitting: Internal Medicine

## 2017-11-18 NOTE — Telephone Encounter (Signed)
Copied from [place] [id number]. Topic: Quick Communication - Rx Refill/Question >> Nov 18, 2017 11:10 AM Celedonio Savage L wrote: Medication: gabapentin (NEURONTIN) 300 MG capsule   Has the patient contacted their pharmacy? Yes.     (Agent: If no, request that the patient contact the pharmacy for the refill.)   Preferred Pharmacy (with phone number or street name):     [facility] - [place], [place] [id number] (Phone) [phone] (Fax)     Agent: Please be advised that RX refills may take up to 3 business days. We ask that you follow-up with your pharmacy.

## 2017-11-19 ENCOUNTER — Ambulatory Visit: Payer: Self-pay | Admitting: Internal Medicine

## 2017-11-19 LAB — HIV-1 RNA QUANT-NO REFLEX-BLD
HIV 1 RNA Quant: <20 copies/mL
HIV-1 RNA QUANT, LOG: NOT DETECTED {Log_copies}/mL

## 2017-11-27 ENCOUNTER — Ambulatory Visit (INDEPENDENT_AMBULATORY_CARE_PROVIDER_SITE_OTHER): Payer: Medicare Other | Admitting: Internal Medicine

## 2017-11-27 ENCOUNTER — Encounter: Payer: Self-pay | Admitting: Internal Medicine

## 2017-11-27 VITALS — BP 110/71 | HR 104 | Temp 98.3°F | Ht 63.0 in | Wt 257.0 lb

## 2017-11-27 DIAGNOSIS — B20 Human immunodeficiency virus [HIV] disease: Secondary | ICD-10-CM | POA: Diagnosis not present

## 2017-11-27 DIAGNOSIS — Z113 Encounter for screening for infections with a predominantly sexual mode of transmission: Secondary | ICD-10-CM

## 2017-11-27 DIAGNOSIS — Z5181 Encounter for therapeutic drug level monitoring: Secondary | ICD-10-CM | POA: Diagnosis not present

## 2017-11-27 DIAGNOSIS — F331 Major depressive disorder, recurrent, moderate: Secondary | ICD-10-CM

## 2017-11-27 MED ORDER — BICTEGRAVIR-EMTRICITAB-TENOFOV 50-200-25 MG PO TABS: 1.0000 | ORAL_TABLET | Freq: Every day | ORAL | 11 refills | Status: DC

## 2017-11-29 NOTE — Assessment & Plan Note (Signed)
Worsened today.  No current psychosis.  Was seen by our counselor at the time of the visit.  Will continue to follow up with her.

## 2017-11-29 NOTE — Assessment & Plan Note (Signed)
Doing well with this, good control. rtc 6 months.

## 2017-11-29 NOTE — Progress Notes (Signed)
   Subjective:    Patient ID: Susan [name], female    DOB: 20-Jul-1961, 57 y.o.   MRN: [id number]  HPI Here for follow up of HIV Doing well with Genvoya.  No associated n/v/d.    CD4 630, viral load < 20.  Having worsening depression, no SI.  No other complaints.    Review of Systems  Constitutional: Negative for fatigue.  Gastrointestinal: Negative for diarrhea.  Skin: Negative for rash.  Neurological: Negative for dizziness.       Objective:   Physical Exam  Constitutional: She appears well-developed and well-nourished. No distress.  Eyes: No scleral icterus.  Cardiovascular: Normal rate, regular rhythm and normal heart sounds.  No murmur heard. Pulmonary/Chest: Effort normal and breath sounds normal. No respiratory distress.  Lymphadenopathy:    She has no cervical adenopathy.  Skin: No rash noted.   SH: + tobacco       Assessment & Plan:

## 2017-12-22 ENCOUNTER — Telehealth: Payer: Self-pay | Admitting: Endocrinology

## 2017-12-22 NOTE — Telephone Encounter (Signed)
please call patient: a1c is higher.  Do you ever miss the insulin?  How are cbg's?

## 2017-12-22 NOTE — Telephone Encounter (Signed)
-----   Message from Dorna Leitz, [place] sent at 12/18/2017  4:46 PM EDT ----- Regarding: A1c I received a call for [place], Starling Manns that went to visit patient today. Her A1c was elevated to a 10.2 & wanted to make you aware of this.

## 2017-12-23 ENCOUNTER — Encounter: Payer: Self-pay | Admitting: Internal Medicine

## 2017-12-23 ENCOUNTER — Ambulatory Visit (INDEPENDENT_AMBULATORY_CARE_PROVIDER_SITE_OTHER): Payer: Medicare Other | Admitting: Internal Medicine

## 2017-12-23 DIAGNOSIS — G609 Hereditary and idiopathic neuropathy, unspecified: Secondary | ICD-10-CM

## 2017-12-23 MED ORDER — GABAPENTIN 300 MG PO CAPS: 600.0000 mg | ORAL_CAPSULE | Freq: Four times a day (QID) | ORAL | 6 refills | Status: DC

## 2017-12-23 MED ORDER — OMEPRAZOLE 20 MG PO CPDR: 20.0000 mg | DELAYED_RELEASE_CAPSULE | Freq: Every day | ORAL | 3 refills | Status: DC

## 2017-12-23 NOTE — Progress Notes (Signed)
   Subjective:    Patient ID: Susan [name], female    DOB: 1960-12-13, 57 y.o.   MRN: [id number]  HPI The patient is a 57 YO female coming in for follow up of her neuropathy. Cause unknown. She had been well controlled on gabapentin QID dosing. For some reason last pharmacy fill label was BID dosing. She has been taking it BID per bottle but having dramatically worse neuropathy pain in the upper legs and stomach. Denies any ulcers or sores. Watches her feet. Denies change in diet. Reports good sugars at home lately. No missed meds.  Review of Systems  Constitutional: Negative.   Respiratory: Negative for cough, chest tightness and shortness of breath.   Cardiovascular: Negative for chest pain, palpitations and leg swelling.  Gastrointestinal: Negative for abdominal distention, abdominal pain, constipation, diarrhea, nausea and vomiting.  Musculoskeletal: Negative.   Skin: Negative.   Neurological: Positive for numbness.      Objective:   Physical Exam  Constitutional: She is oriented to person, place, and time. She appears well-developed and well-nourished.  HENT:  Head: Normocephalic and atraumatic.  Eyes: EOM are normal.  Neck: Normal range of motion.  Cardiovascular: Normal rate and regular rhythm.  Pulmonary/Chest: Effort normal and breath sounds normal. No respiratory distress. She has no wheezes. She has no rales.  Abdominal: Soft.  Musculoskeletal: She exhibits no edema.  Neurological: She is alert and oriented to person, place, and time. Coordination normal.  Skin: Skin is warm and dry.   Vitals:   12/23/17 0840  BP: 130/88  Pulse: 87  Temp: 98.5 F (36.9 C)  TempSrc: Oral  SpO2: 95%  Weight: 255 lb (115.7 kg)  Height: 5\' 3"  (1.6 m)      Assessment & Plan:

## 2017-12-23 NOTE — Patient Instructions (Addendum)
We will send in the gabapentin to take 4 times per day as before so ask the pharmacist.   We have also sent in the prilosec.   Next time we will check the cholesterol.

## 2017-12-23 NOTE — Assessment & Plan Note (Signed)
Refill gabapentin 600 mg QID for pain as previously on.

## 2017-12-23 NOTE — Telephone Encounter (Signed)
I LVM for patient asking her to give [place] a call back.

## 2018-01-13 ENCOUNTER — Ambulatory Visit: Payer: Self-pay | Admitting: Endocrinology

## 2018-01-13 DIAGNOSIS — Z0289 Encounter for other administrative examinations: Secondary | ICD-10-CM

## 2018-01-20 ENCOUNTER — Telehealth: Payer: Self-pay

## 2018-01-20 MED ORDER — FLUCONAZOLE 150 MG PO TABS: 150.0000 mg | ORAL_TABLET | ORAL | 0 refills | Status: DC

## 2018-01-20 MED ORDER — FLUCONAZOLE 150 MG PO TABS: 150.0000 mg | ORAL_TABLET | Freq: Once | ORAL | 0 refills | Status: DC

## 2018-01-20 NOTE — Telephone Encounter (Signed)
Sent in 2 pills

## 2018-01-20 NOTE — Telephone Encounter (Signed)
Sent in diflucan for yeast infection. Would recommend she come in to get fevers checked out as usually yeast infection does not cause fevers.

## 2018-01-20 NOTE — Telephone Encounter (Signed)
Copied from [place] [id number]. Topic: Inquiry >> Jan 20, 2018  9:13 AM Pricilla Handler wrote: Reason for CRM: Patient called wanting to know if Dr. Sharlet Salina will send in a medication for her. Patient has a Yeast Infection and Fever. Patient states that Dr. Sharlet Salina will usually send a medication to her pharmacy when her Yeast Infections develop. Please call patient.       Thank You!!!

## 2018-01-20 NOTE — Telephone Encounter (Signed)
Patient informed of MD response and is wanting to have two diflucan sent in because the one does not usually fully take it away.

## 2018-01-20 NOTE — Addendum Note (Signed)
Addended by: Pricilla Holm A on: 01/20/2018 03:49 PM   Modules accepted: Orders

## 2018-01-25 ENCOUNTER — Other Ambulatory Visit: Payer: Self-pay | Admitting: Internal Medicine

## 2018-02-19 ENCOUNTER — Encounter: Payer: Self-pay | Admitting: Internal Medicine

## 2018-02-19 ENCOUNTER — Ambulatory Visit: Payer: Self-pay | Admitting: Internal Medicine

## 2018-02-19 ENCOUNTER — Other Ambulatory Visit (INDEPENDENT_AMBULATORY_CARE_PROVIDER_SITE_OTHER): Payer: Medicare Other

## 2018-02-19 ENCOUNTER — Ambulatory Visit (INDEPENDENT_AMBULATORY_CARE_PROVIDER_SITE_OTHER): Payer: Medicare Other | Admitting: Internal Medicine

## 2018-02-19 VITALS — BP 140/86 | HR 100 | Temp 98.1°F | Ht 63.0 in | Wt 249.0 lb

## 2018-02-19 DIAGNOSIS — J42 Unspecified chronic bronchitis: Secondary | ICD-10-CM | POA: Diagnosis not present

## 2018-02-19 DIAGNOSIS — M25551 Pain in right hip: Secondary | ICD-10-CM | POA: Diagnosis not present

## 2018-02-19 DIAGNOSIS — E119 Type 2 diabetes mellitus without complications: Secondary | ICD-10-CM

## 2018-02-19 DIAGNOSIS — J209 Acute bronchitis, unspecified: Secondary | ICD-10-CM

## 2018-02-19 DIAGNOSIS — F17200 Nicotine dependence, unspecified, uncomplicated: Secondary | ICD-10-CM | POA: Diagnosis not present

## 2018-02-19 LAB — COMPREHENSIVE METABOLIC PANEL
ALBUMIN: 3.5 g/dL (ref 3.5–5.2)
ALK PHOS: 119 U/L — AB (ref 39–117)
ALT: 8 U/L (ref 0–35)
AST: 8 U/L (ref 0–37)
BUN: 6 mg/dL (ref 6–23)
CALCIUM: 9.1 mg/dL (ref 8.4–10.5)
CHLORIDE: 102 meq/L (ref 96–112)
CO2: 29 mEq/L (ref 19–32)
CREATININE: 0.68 mg/dL (ref 0.40–1.20)
GFR: 115.22 mL/min (ref >60.00)
Glucose, Bld: 260 mg/dL — ABNORMAL HIGH (ref 70–99)
POTASSIUM: 4.3 meq/L (ref 3.5–5.1)
SODIUM: 140 meq/L (ref 135–145)
TOTAL PROTEIN: 6.8 g/dL (ref 6.0–8.3)
Total Bilirubin: 0.3 mg/dL (ref 0.2–1.2)

## 2018-02-19 LAB — LIPID PANEL
Cholesterol: 146 mg/dL (ref 0–200)
HDL: 48.8 mg/dL (ref >39.00)
LDL CALC: 74 mg/dL (ref 0–99)
NonHDL: 97.41
Total CHOL/HDL Ratio: 3
Triglycerides: 116 mg/dL (ref 0.0–149.0)
VLDL: 23.2 mg/dL (ref 0.0–40.0)

## 2018-02-19 LAB — HEMOGLOBIN A1C: HEMOGLOBIN A1C: 11.1 % — AB (ref 4.6–6.5)

## 2018-02-19 MED ORDER — GABAPENTIN 300 MG PO CAPS: 600.0000 mg | ORAL_CAPSULE | Freq: Four times a day (QID) | ORAL | 6 refills | Status: DC

## 2018-02-19 MED ORDER — TRAMADOL HCL 50 MG PO TABS: 50.0000 mg | ORAL_TABLET | Freq: Every evening | ORAL | 0 refills | Status: DC | PRN

## 2018-02-19 MED ORDER — DOXYCYCLINE HYCLATE 100 MG PO TABS: 100.0000 mg | ORAL_TABLET | Freq: Two times a day (BID) | ORAL | 0 refills | Status: DC

## 2018-02-19 NOTE — Progress Notes (Signed)
   Subjective:    Patient ID: Susan [name], female    DOB: [birth year], 57 y.o.   MRN: [id number]  HPI The patient is a 57 YO female coming in for several problems including fall recently (scausing right sided pain, she fell getting out of the tub, usually takes shower with shower chair, denies head injury or LOC with episode, did not seek care at the time, happened 1 week ago, taking tylenol which is not helping), and her cough (going on about 2-3 weeks, some SOB and more sputum production, still smoking and not smoking as well during this illness), and her diabetes (not been doing as well with diet, is due to see endocrine in several weeks, is taking her Metformin and humulin, she is at increased risk for diabetes from her mental illness and concurrent diabetes, complicated by severe peripheral neuropathy which is worse lately due to pharmacy not giving her the correct dosage of her gabapentin which she normally takes QID, has only had TID recently and in more pain).   Review of Systems  Constitutional: Positive for activity change, appetite change and fatigue.  HENT: Negative.   Eyes: Negative.   Respiratory: Positive for cough, shortness of breath and wheezing. Negative for chest tightness.   Cardiovascular: Negative for chest pain, palpitations and leg swelling.  Gastrointestinal: Negative for abdominal distention, abdominal pain, constipation, diarrhea, nausea and vomiting.  Musculoskeletal: Positive for arthralgias, back pain and myalgias. Negative for joint swelling.  Skin: Negative.   Neurological: Positive for weakness and numbness. Negative for dizziness, tremors and light-headedness.  Psychiatric/Behavioral: Negative.       Objective:   Physical Exam  Constitutional: She is oriented to person, place, and time. She appears well-developed and well-nourished.  HENT:  Head: Normocephalic and atraumatic.  Eyes: EOM are normal.  Neck: Normal range of motion.  Cardiovascular: Normal  rate and regular rhythm.  Pulmonary/Chest: Effort normal. No respiratory distress. She has wheezes. She has no rales.  Expiratory wheezing and some rhonchi diffuse  Abdominal: Soft. Bowel sounds are normal. She exhibits no distension. There is no tenderness. There is no rebound.  Musculoskeletal: She exhibits tenderness. She exhibits no edema.  Neurological: She is alert and oriented to person, place, and time. Coordination normal.  Skin: Skin is warm and dry.  Psychiatric: She has a normal mood and affect.   Vitals:   02/19/18 0934  BP: 140/86  Pulse: 100  Temp: 98.1 F (36.7 C)  TempSrc: Oral  SpO2: 95%  Weight: 249 lb (112.9 kg)  Height: 5\' 3"  (1.6 m)      Assessment & Plan:

## 2018-02-19 NOTE — Patient Instructions (Addendum)
We have sent in the new gabapentin dosing and will call the pharmacy to make sure you can take it 4 times per day.   We have sent in doxycycline to take 1 pill twice a day for 1 week for the lungs and breathing.  Make sure to try to quit smoking as this helps the lungs stay more healthy.

## 2018-02-21 DIAGNOSIS — J209 Acute bronchitis, unspecified: Secondary | ICD-10-CM | POA: Insufficient documentation

## 2018-02-21 DIAGNOSIS — M25551 Pain in right hip: Secondary | ICD-10-CM | POA: Insufficient documentation

## 2018-02-21 DIAGNOSIS — J42 Unspecified chronic bronchitis: Secondary | ICD-10-CM

## 2018-02-21 NOTE — Assessment & Plan Note (Signed)
Rx for doxycycline. She does not use any inhalers chronically. She may be getting close to this. She is advised to stop smoking and she is not sure if that is possible. She would like to try chantix but cannot due to concurrent mental health medications.

## 2018-02-21 NOTE — Assessment & Plan Note (Addendum)
Rx for tramadol short term for the pain from fall. We talked about home safety and fall prevention. She will also fill the new gabapentin and have asked the pharmacy to cancel old dosage as she is still not getting her medication correctly.

## 2018-02-21 NOTE — Assessment & Plan Note (Signed)
Time spent counseling about tobacco usage: 4 minutes. I have asked about smoking and is smoking less than usual due to illness. The patient is advised to quit. The patient is not sure about being willing to quit. They would like to try to quit in the next 6 months. We will follow up with them in 6 months, she is unable to take bupropion or chantix due to concurrent mental disease medications and this limits her treatment options to quit, we discussed lozenges or patch nicotine.

## 2018-03-07 ENCOUNTER — Telehealth: Payer: Self-pay | Admitting: Internal Medicine

## 2018-03-07 NOTE — Telephone Encounter (Unsigned)
Copied from [place] [id number]. Topic: Quick Communication - Rx Refill/Question >> Mar 07, 2018  5:51 PM Neva Seat wrote: Lidocaina ointment   Verbal for refill and or approve fax.  [facility] - [phone]

## 2018-03-10 ENCOUNTER — Encounter: Payer: Medicare Other | Admitting: Internal Medicine

## 2018-03-10 NOTE — Telephone Encounter (Signed)
Do not see lidocaine ointment on current med list is this okay to refill

## 2018-03-10 NOTE — Telephone Encounter (Signed)
What strength and for what indication is this being requested? Is patient wanting or just pharmacist?

## 2018-03-10 NOTE — Telephone Encounter (Signed)
LVM for patient to call back for MD response  

## 2018-03-12 NOTE — Telephone Encounter (Signed)
LVM for patient to call back about the lidocaine ointment Rx and the [facility]. Looking into the pharmacy it states that they solicit lidocaine ointment to patients that have never been prescribed that medication. It is kind of looking like a scam and wanted patient to be aware. If patient is wanting the lidocaine ointment for pain I would recommend a visit.

## 2018-03-12 NOTE — Telephone Encounter (Signed)
Spoke to patient, she stated she got a letter that her Medicaid finally covered her lidocaine ointment after 2 months, I do not see where this has been prescribed and also [facility] may be a scam?   This was requested from Pharmacy, [place] from [place].  Please advise.

## 2018-03-31 ENCOUNTER — Telehealth: Payer: Self-pay | Admitting: Internal Medicine

## 2018-03-31 NOTE — Telephone Encounter (Signed)
Copied from [place] [id number]. Topic: Quick Communication - Rx Refill/Question >> Mar 31, 2018 12:21 PM Synthia Innocent wrote: Medication: Lidocaine Ointment 5%  Has the patient contacted their pharmacy? Yes.   (Agent: If no, request that the patient contact the pharmacy for the refill.) (Agent: If yes, when and what did the pharmacy advise?)  Preferred Pharmacy (with phone number or street name): [facility]  Agent: Please be advised that RX refills may take up to 3 business days. We ask that you follow-up with your pharmacy.

## 2018-04-01 NOTE — Telephone Encounter (Signed)
Left VM to CB re: refill request of Lidocaine Ointment. See TE of 03/12/18

## 2018-04-03 ENCOUNTER — Encounter: Payer: Medicare Other | Admitting: Internal Medicine

## 2018-04-17 ENCOUNTER — Ambulatory Visit (INDEPENDENT_AMBULATORY_CARE_PROVIDER_SITE_OTHER): Payer: Medicare Other | Admitting: Internal Medicine

## 2018-04-17 ENCOUNTER — Encounter: Payer: Self-pay | Admitting: Internal Medicine

## 2018-04-17 DIAGNOSIS — G609 Hereditary and idiopathic neuropathy, unspecified: Secondary | ICD-10-CM

## 2018-04-17 NOTE — Progress Notes (Signed)
   Subjective:    Patient ID: Susan [name], female    DOB: [birth year], 57 y.o.   MRN: [id number]  HPI The patient is a 57 YO female coming in for pain in her legs. She has had ongoing problems with the pharmacy not giving her QID gabapentin. The gabapentin does help with her pain. Does have some falls at home since last visit. Denies new numbness but stable. She is having worse pain on the right leg than left. Diabetes is stable denies worsening. Denies muscle pain. More of burning.   Review of Systems  Constitutional: Negative.   HENT: Negative.   Eyes: Negative.   Respiratory: Negative for cough, chest tightness and shortness of breath.   Cardiovascular: Negative for chest pain, palpitations and leg swelling.  Gastrointestinal: Negative for abdominal distention, abdominal pain, constipation, diarrhea, nausea and vomiting.  Musculoskeletal: Positive for arthralgias and myalgias.  Skin: Negative.   Neurological: Negative.  Negative for dizziness, light-headedness, numbness and headaches.  Psychiatric/Behavioral: Negative.       Objective:   Physical Exam  Constitutional: She is oriented to person, place, and time. She appears well-developed and well-nourished.  overweight  HENT:  Head: Normocephalic and atraumatic.  Eyes: EOM are normal.  Neck: Normal range of motion.  Cardiovascular: Normal rate and regular rhythm.  Pulmonary/Chest: Effort normal and breath sounds normal. No respiratory distress. She has no wheezes. She has no rales.  Abdominal: Soft. Bowel sounds are normal. She exhibits no distension. There is no tenderness. There is no rebound.  Musculoskeletal: She exhibits no edema.  Neurological: She is alert and oriented to person, place, and time. A cranial nerve deficit is present. Coordination abnormal.  Unstable gait  Skin: Skin is warm and dry.   Vitals:   04/17/18 0842  BP: (!) 164/90  Pulse: 100  Temp: 98.7 F (37.1 C)  TempSrc: Oral  SpO2: 95%  Weight: 254  lb (115.2 kg)  Height: 5\' 3"  (1.6 m)      Assessment & Plan:

## 2018-04-17 NOTE — Assessment & Plan Note (Signed)
Rx for gabapentin QID was sent in at last visit and she has not been able to get this. Called pharmacy to make sure they dispense this for her and cancel TID dosing. This has worked well for her. Talked to her about using walker with seat for more stability and she agrees but financially wants to wait until September 1st for financial reasons and will let us know when she is ready for this.

## 2018-04-17 NOTE — Patient Instructions (Signed)
We will call the pharmacy to make sure the gabapentin to 4 times a day as it should be.

## 2018-04-18 ENCOUNTER — Telehealth: Payer: Self-pay | Admitting: Internal Medicine

## 2018-04-18 ENCOUNTER — Other Ambulatory Visit: Payer: Self-pay

## 2018-04-18 ENCOUNTER — Other Ambulatory Visit: Payer: Self-pay | Admitting: Internal Medicine

## 2018-04-18 NOTE — Telephone Encounter (Signed)
She should be taking 300 mg QID. This was called in.

## 2018-04-18 NOTE — Patient Outreach (Signed)
[facility]) Care Management  04/18/2018  [name] 28-Jan-1961 [id number]   Medication Adherence call to Mrs. Susan [name] patient is showing due on Rosuvastatin 20 mg spoke with patient she ask if we can contact [facility] and order this medication, [facility] said patient does not have refill and will contact the doctor, left a message at [place] office for them to send in a prescription to [facility] for a 90 days supply. Mrs. Susan [name] is showing past due under [place].    [place] Management Direct Dial [phone]  Fax [phone] [name]

## 2018-04-18 NOTE — Telephone Encounter (Signed)
Patient informed of MD response and stated understanding  

## 2018-04-18 NOTE — Telephone Encounter (Signed)
Copied from [place] [id number]. Topic: General - Other >> Apr 18, 2018  1:52 PM Lennox Solders wrote: Reason for CRM: pt is calling and saw dr crawford yesterday. Pt said dr Sharlet Salina increase gabapentin 300 mg to 400 mg and patient will take medication four times a day. [facility]

## 2018-05-06 ENCOUNTER — Other Ambulatory Visit: Payer: Medicare Other

## 2018-05-06 DIAGNOSIS — B20 Human immunodeficiency virus [HIV] disease: Secondary | ICD-10-CM

## 2018-05-06 DIAGNOSIS — E119 Type 2 diabetes mellitus without complications: Secondary | ICD-10-CM | POA: Diagnosis not present

## 2018-05-06 DIAGNOSIS — Z113 Encounter for screening for infections with a predominantly sexual mode of transmission: Secondary | ICD-10-CM

## 2018-05-07 LAB — T-HELPER CELL (CD4) - (RCID CLINIC ONLY)
CD4 % Helper T Cell: 30 % — ABNORMAL LOW (ref 33–55)
CD4 T Cell Abs: 740 /uL (ref 400–2700)

## 2018-05-08 LAB — COMPLETE METABOLIC PANEL WITH GFR
AG Ratio: 1.5 (calc) (ref 1.0–2.5)
ALBUMIN MSPROF: 3.7 g/dL (ref 3.6–5.1)
ALKALINE PHOSPHATASE (APISO): 116 U/L (ref 33–130)
ALT: 7 U/L (ref 6–29)
AST: 8 U/L — ABNORMAL LOW (ref 10–35)
BUN / CREAT RATIO: 6 (calc) (ref 6–22)
BUN: 4 mg/dL — ABNORMAL LOW (ref 7–25)
CHLORIDE: 100 mmol/L (ref 98–110)
CO2: 34 mmol/L — AB (ref 20–32)
CREATININE: 0.71 mg/dL (ref 0.50–1.05)
Calcium: 8.5 mg/dL — ABNORMAL LOW (ref 8.6–10.4)
GFR, Est African American: 111 mL/min/{1.73_m2} (ref >=60)
GFR, Est Non African American: 96 mL/min/{1.73_m2} (ref >=60)
GLOBULIN: 2.5 g/dL (ref 1.9–3.7)
GLUCOSE: 196 mg/dL — AB (ref 65–99)
Potassium: 3.3 mmol/L — ABNORMAL LOW (ref 3.5–5.3)
SODIUM: 142 mmol/L (ref 135–146)
Total Bilirubin: 0.4 mg/dL (ref 0.2–1.2)
Total Protein: 6.2 g/dL (ref 6.1–8.1)

## 2018-05-08 LAB — RPR: RPR Ser Ql: NONREACTIVE

## 2018-05-08 LAB — HIV-1 RNA QUANT-NO REFLEX-BLD
HIV 1 RNA Quant: <20 copies/mL
HIV-1 RNA Quant, Log: <1.3 Log copies/mL

## 2018-05-09 DIAGNOSIS — E119 Type 2 diabetes mellitus without complications: Secondary | ICD-10-CM | POA: Diagnosis not present

## 2018-05-15 ENCOUNTER — Ambulatory Visit: Payer: Self-pay | Admitting: Endocrinology

## 2018-05-15 DIAGNOSIS — Z0289 Encounter for other administrative examinations: Secondary | ICD-10-CM

## 2018-05-20 ENCOUNTER — Encounter: Payer: Self-pay | Admitting: Internal Medicine

## 2018-05-20 ENCOUNTER — Ambulatory Visit (INDEPENDENT_AMBULATORY_CARE_PROVIDER_SITE_OTHER): Payer: Medicare Other | Admitting: Internal Medicine

## 2018-05-20 VITALS — BP 149/86 | HR 83 | Temp 98.2°F | Wt 254.0 lb

## 2018-05-20 DIAGNOSIS — E785 Hyperlipidemia, unspecified: Secondary | ICD-10-CM | POA: Diagnosis not present

## 2018-05-20 DIAGNOSIS — Z23 Encounter for immunization: Secondary | ICD-10-CM | POA: Diagnosis not present

## 2018-05-20 DIAGNOSIS — B20 Human immunodeficiency virus [HIV] disease: Secondary | ICD-10-CM | POA: Diagnosis not present

## 2018-05-20 DIAGNOSIS — Z113 Encounter for screening for infections with a predominantly sexual mode of transmission: Secondary | ICD-10-CM

## 2018-05-20 NOTE — Progress Notes (Signed)
   Subjective:    Patient ID: Susan [name], female    DOB: 28-Jul-1961, 57 y.o.   MRN: [id number]  HPI Here for follow up of HIV Doing well with Biktarvy.  No associated n/v/d.    CD4 740, viral load < 20.  Has continuing depression and scheduled to see [facility] next week.  Some occasional loose stools but no associated weight loss.  Continues to see her PCP and gets PAP from her.  [facility].    Review of Systems  Constitutional: Negative for fatigue.  Gastrointestinal: Negative for diarrhea.  Skin: Negative for rash.  Neurological: Negative for dizziness.       Objective:   Physical Exam  Constitutional: She appears well-developed and well-nourished. No distress.  Eyes: No scleral icterus.  Cardiovascular: Normal rate, regular rhythm and normal heart sounds.  No murmur heard. Pulmonary/Chest: Effort normal and breath sounds normal. No respiratory distress.  Lymphadenopathy:    She has no cervical adenopathy.  Skin: No rash noted.   SH: + tobacco       Assessment & Plan:

## 2018-05-20 NOTE — Addendum Note (Signed)
Addended by: Lenore Cordia on: 05/20/2018 10:40 AM   Modules accepted: Orders

## 2018-05-20 NOTE — Assessment & Plan Note (Signed)
Doing well and no issues.  Can rtc 6 months.

## 2018-05-20 NOTE — Assessment & Plan Note (Signed)
Management per PCP

## 2018-05-20 NOTE — Assessment & Plan Note (Signed)
Screened negative 

## 2018-05-20 NOTE — Assessment & Plan Note (Signed)
Prevnar and flu given today

## 2018-05-21 ENCOUNTER — Encounter: Payer: Self-pay | Admitting: Internal Medicine

## 2018-06-10 ENCOUNTER — Other Ambulatory Visit: Payer: Self-pay

## 2018-06-10 NOTE — Patient Outreach (Signed)
[facility]) Care Management  06/10/2018  Susan [name] [birth year] [id number]   Medication Adherence call to Susan [name] spoke with patient she said doctor took her off  Enalapril 20 mg for about a month but, now she is taking it again patient has medication until her next appointment with doctor in about a month. Susan [name] is showing past due under [place] [employer] Ins.   [facility] Direct Dial [phone]  Fax [phone] [name]

## 2018-06-13 DIAGNOSIS — H524 Presbyopia: Secondary | ICD-10-CM | POA: Diagnosis not present

## 2018-07-21 ENCOUNTER — Ambulatory Visit (INDEPENDENT_AMBULATORY_CARE_PROVIDER_SITE_OTHER): Payer: Medicare Other | Admitting: Internal Medicine

## 2018-07-21 ENCOUNTER — Encounter: Payer: Self-pay | Admitting: Internal Medicine

## 2018-07-21 DIAGNOSIS — Z720 Tobacco use: Secondary | ICD-10-CM

## 2018-07-21 DIAGNOSIS — J40 Bronchitis, not specified as acute or chronic: Secondary | ICD-10-CM | POA: Diagnosis not present

## 2018-07-21 MED ORDER — DOXYCYCLINE HYCLATE 100 MG PO TABS: 100.0000 mg | ORAL_TABLET | Freq: Two times a day (BID) | ORAL | 0 refills | Status: DC

## 2018-07-21 MED ORDER — ALBUTEROL SULFATE 108 (90 BASE) MCG/ACT IN AEPB: 1.0000 | INHALATION_SPRAY | RESPIRATORY_TRACT | 2 refills | Status: DC | PRN

## 2018-07-21 MED ORDER — PREDNISONE 20 MG PO TABS: 40.0000 mg | ORAL_TABLET | Freq: Every day | ORAL | 0 refills | Status: DC

## 2018-07-21 NOTE — Progress Notes (Signed)
   Subjective:    Patient ID: Susan [name], female    DOB: September 06, 1960, 57 y.o.   MRN: [id number]  HPI The patient is a 57 YO female coming in for cold symptoms. Started about 1 week or so ago. Having grey to white phlegm with this. Also some body aches. Taking nyquil and dayquil currently. She denies fevers but chills. She is overall not improving since onset. More SOB and she does not have a current albuterol inhaler which she feels that she needs. Having a lot of sinus congestion as well  Review of Systems  Constitutional: Positive for activity change, appetite change and chills. Negative for fatigue, fever and unexpected weight change.  HENT: Positive for congestion, postnasal drip, rhinorrhea and sinus pressure. Negative for ear discharge, ear pain, sinus pain, sneezing, sore throat, tinnitus, trouble swallowing and voice change.   Eyes: Negative.   Respiratory: Positive for cough and shortness of breath. Negative for chest tightness and wheezing.   Cardiovascular: Negative.   Gastrointestinal: Negative.   Musculoskeletal: Positive for myalgias.  Neurological: Negative.       Objective:   Physical Exam  Constitutional: She is oriented to person, place, and time. She appears well-developed and well-nourished.  Smelling of cigarettes  HENT:  Head: Normocephalic and atraumatic.  Oropharynx with redness and clear drainage, nose with swollen turbinates, TMs normal bilaterally  Eyes: EOM are normal.  Neck: Normal range of motion. No thyromegaly present.  Cardiovascular: Normal rate and regular rhythm.  Pulmonary/Chest: Effort normal. No respiratory distress. She has wheezes. She has no rales.  Some expiratory wheezing bilateral and coarse rhonchi throughout which partially clear with coughing.   Abdominal: Soft.  Musculoskeletal: She exhibits tenderness.  Lymphadenopathy:    She has no cervical adenopathy.  Neurological: She is alert and oriented to person, place, and time.  Skin:  Skin is warm and dry.   Vitals:   07/21/18 1116  BP: (!) 180/100  Pulse: 85  Temp: 97.8 F (36.6 C)  TempSrc: Oral  SpO2: 94%  Weight: 242 lb (109.8 kg)  Height: 5\' 3"  (1.6 m)      Assessment & Plan:

## 2018-07-21 NOTE — Assessment & Plan Note (Signed)
Rx for albuterol inhaler, prednisone and doxycycline. Suspect chronic bronchitis in the setting of cigarette abuse but not confirmed with PFTs at this time.

## 2018-07-21 NOTE — Patient Instructions (Signed)
We have sent in the albuterol inhaler to use for breathing.   We have sent in prednisone to take 2 pills daily for 5 days.  We have sent in the antibiotic doxycyline to take 1 pill twice a day for 1 week.

## 2018-07-30 ENCOUNTER — Telehealth: Payer: Self-pay | Admitting: Internal Medicine

## 2018-07-30 NOTE — Telephone Encounter (Signed)
Since doctor crawford is not in office till next Tuesday if patient wants to get the rx before then she will have to make an office visit with someone or come to our clinic Friday or Saturday.

## 2018-07-30 NOTE — Telephone Encounter (Signed)
Pt cannot make appt due to transportation, she would like a call back when crawford is in the office

## 2018-07-30 NOTE — Telephone Encounter (Signed)
Copied from [place] [id number]. Topic: Quick Communication - Rx Refill/Question >> Jul 30, 2018 11:55 AM Scherrie Gerlach wrote: Medication: magic mouthwash  Pt states Dr Sharlet Salina has filled this for her in the past, but pt states she has thrush.  Her mouth is white inside, and feels horrible. Pt states she has had this before, so knows what it is. Pt states Dr Sharlet Salina aware of her health issues, and if I would please send her a message  [facility] [place], [place] [phone] (Phone) [phone] (Fax)

## 2018-08-05 ENCOUNTER — Other Ambulatory Visit: Payer: Self-pay

## 2018-08-05 MED ORDER — MAGIC MOUTHWASH: 5.0000 mL | Freq: Three times a day (TID) | ORAL | 0 refills | Status: DC

## 2018-08-05 NOTE — Patient Outreach (Signed)
[place]) Care Management  08/05/2018  Susan [name] [birth year] [id number]   Medication Adherence call to Mrs. Susan [name] spoke with patient she is due on Rosuvastatin 20 mg she ask to call [facility] an order this medication she will pick up later. Susan [name] is showing past due under [place].    [place] Management Direct Dial [phone]  Fax [phone] [name]

## 2018-08-05 NOTE — Telephone Encounter (Signed)
Patient informed Rx was sent

## 2018-08-05 NOTE — Telephone Encounter (Signed)
Sent in rx.

## 2018-08-15 ENCOUNTER — Ambulatory Visit: Payer: Self-pay | Admitting: Endocrinology

## 2018-08-21 ENCOUNTER — Other Ambulatory Visit: Payer: Self-pay

## 2018-08-21 ENCOUNTER — Other Ambulatory Visit: Payer: Self-pay | Admitting: Endocrinology

## 2018-08-21 ENCOUNTER — Telehealth: Payer: Self-pay | Admitting: Endocrinology

## 2018-08-21 DIAGNOSIS — E119 Type 2 diabetes mellitus without complications: Secondary | ICD-10-CM

## 2018-08-21 MED ORDER — INSULIN PEN NEEDLE 32G X 4 MM MISC: 1.0000 | Freq: Every day | 11 refills | Status: DC

## 2018-08-21 NOTE — Telephone Encounter (Signed)
Patient stated that her pharmacy has sent over a refill request for her pen needles. And she would like to follow up and see if this has been done.   Please advise     [place], [place]

## 2018-08-21 NOTE — Telephone Encounter (Signed)
error 

## 2018-08-21 NOTE — Telephone Encounter (Signed)
Called pt and made her aware this has been filled. Verbalized acceptance and understanding.

## 2018-08-21 NOTE — Telephone Encounter (Signed)
Please refill x 1 Ov is due  

## 2018-08-21 NOTE — Telephone Encounter (Signed)
Last OV 11/11/17 refill or refuse please advise

## 2018-08-25 ENCOUNTER — Encounter: Payer: Medicare Other | Admitting: Internal Medicine

## 2018-09-10 ENCOUNTER — Ambulatory Visit: Payer: Self-pay | Admitting: Endocrinology

## 2018-09-17 ENCOUNTER — Ambulatory Visit: Payer: Self-pay | Admitting: Endocrinology

## 2018-09-17 ENCOUNTER — Encounter: Payer: Self-pay | Admitting: Endocrinology

## 2018-09-24 ENCOUNTER — Ambulatory Visit (INDEPENDENT_AMBULATORY_CARE_PROVIDER_SITE_OTHER): Payer: Medicare Other | Admitting: Endocrinology

## 2018-09-24 ENCOUNTER — Encounter: Payer: Self-pay | Admitting: Endocrinology

## 2018-09-24 VITALS — BP 184/100 | HR 92 | Ht 63.0 in | Wt 256.2 lb

## 2018-09-24 DIAGNOSIS — E119 Type 2 diabetes mellitus without complications: Secondary | ICD-10-CM | POA: Diagnosis not present

## 2018-09-24 LAB — POCT GLYCOSYLATED HEMOGLOBIN (HGB A1C): Hemoglobin A1C: 11.4 % — AB (ref 4.0–5.6)

## 2018-09-24 MED ORDER — GLUCOSE BLOOD VI STRP: 1.0000 | ORAL_STRIP | Freq: Two times a day (BID) | 3 refills | Status: DC

## 2018-09-24 MED ORDER — INSULIN ISOPHANE HUMAN 100 UNIT/ML KWIKPEN: 140.0000 [IU] | PEN_INJECTOR | SUBCUTANEOUS | 0 refills | Status: DC
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
## 2018-09-24 NOTE — Patient Instructions (Addendum)
Your blood pressure is high today.  Please see your primary care provider soon, to have it rechecked Please increase the insulin to 110 units each morning. On this type of insulin schedule, you should eat meals on a regular schedule (especially lunch).  If a meal is missed or significantly delayed, your blood sugar could go low.    Here is a new meter.  I have sent a prescription to your pharmacy, for strips.  check your blood sugar twice a day.  vary the time of day when you check, between before the 3 meals, and at bedtime.  also check if you have symptoms of your blood sugar being too high or too low.  please keep a record of the readings and bring it to your next appointment here (or you can bring the meter itself).  You can write it on any piece of paper.  please call us sooner if your blood sugar goes below 70, or if you have a lot of readings over 200. Please come back for a follow-up appointment in 1 month.

## 2018-10-01 ENCOUNTER — Telehealth: Payer: Self-pay

## 2018-10-01 NOTE — Telephone Encounter (Signed)
Copied from [place] [id number]. Topic: General - Inquiry >> Sep 30, 2018  3:23 PM Alanda Slim E wrote: Reason for CRM: Amado Coe from [employer] called to find out the status of the paperwork faxed on 1/20 for incontinence supplies/ please advise

## 2018-10-01 NOTE — Telephone Encounter (Signed)
Forms are placed in MD folder to sign

## 2018-10-02 ENCOUNTER — Telehealth: Payer: Self-pay | Admitting: Endocrinology

## 2018-10-02 ENCOUNTER — Ambulatory Visit: Payer: Self-pay | Admitting: Internal Medicine

## 2018-10-02 NOTE — Telephone Encounter (Signed)
Patient called re: Dr. Loanne Drilling gave patient a new meter. Pharmacy told patient they do not have the testing strips for that meter and that patient's insurance does not cover testing strips for that particular meter (which is One Touch Verio). Patient requests RX for a new meter and testing strips be sent to [facility] on [address]. Please call patient at ph# [phone] when RX has been sent, per patient request.

## 2018-10-02 NOTE — Telephone Encounter (Signed)
Returned pt call. Left detailed message advising pt to contact insurance company to determine which meter, strips and lancets are covered by pt insurance plan. Advised she call with this information.

## 2018-10-06 ENCOUNTER — Ambulatory Visit: Payer: Self-pay | Admitting: Internal Medicine

## 2018-10-13 ENCOUNTER — Other Ambulatory Visit: Payer: Self-pay | Admitting: Internal Medicine

## 2018-10-13 NOTE — Telephone Encounter (Signed)
Patient would like a call back from the office when these have been submitted.

## 2018-10-23 ENCOUNTER — Telehealth: Payer: Self-pay | Admitting: Endocrinology

## 2018-10-23 ENCOUNTER — Ambulatory Visit: Payer: Self-pay | Admitting: Endocrinology

## 2018-10-23 NOTE — Telephone Encounter (Signed)
Rescheduled patient's missed appointment for 10/30/18 @ 10:15 a.m.

## 2018-10-23 NOTE — Telephone Encounter (Signed)
Patient no showed today's appt. Please advise on how to follow up. °A. No follow up necessary. °B. Follow up urgent. Contact patient immediately. °C. Follow up necessary. Contact patient and schedule visit in ___ days. °D. Follow up advised. Contact patient and schedule visit in ____weeks. ° °Would you like the NS fee to be applied to this visit? ° °

## 2018-10-23 NOTE — Telephone Encounter (Signed)
Please schedule f/u appt for next available appointment  

## 2018-10-23 NOTE — Telephone Encounter (Signed)
Please refer to Dr. Ellison's response 

## 2018-10-30 ENCOUNTER — Encounter: Payer: Self-pay | Admitting: Endocrinology

## 2018-10-30 ENCOUNTER — Ambulatory Visit (INDEPENDENT_AMBULATORY_CARE_PROVIDER_SITE_OTHER): Payer: Medicare Other | Admitting: Endocrinology

## 2018-10-30 ENCOUNTER — Other Ambulatory Visit: Payer: Self-pay

## 2018-10-30 VITALS — BP 160/70 | HR 85 | Ht 63.0 in | Wt 256.4 lb

## 2018-10-30 DIAGNOSIS — E119 Type 2 diabetes mellitus without complications: Secondary | ICD-10-CM

## 2018-10-30 MED ORDER — METFORMIN HCL 500 MG PO TABS: 1000.0000 mg | ORAL_TABLET | Freq: Two times a day (BID) | ORAL | 11 refills | Status: DC

## 2018-10-30 MED ORDER — INSULIN ISOPHANE HUMAN 100 UNIT/ML KWIKPEN: 130.0000 [IU] | PEN_INJECTOR | SUBCUTANEOUS | 0 refills | Status: DC

## 2018-10-30 MED ORDER — METFORMIN HCL 500 MG PO TABS: 1000.0000 mg | ORAL_TABLET | Freq: Two times a day (BID) | ORAL | 3 refills | Status: DC

## 2018-10-30 NOTE — Patient Instructions (Addendum)
Your blood pressure is high today.  Please see your primary care provider soon, to have it rechecked Please reduce the insulin to 130 units each morning, and: Please continue the same metformin.   On this type of insulin schedule, you should eat meals on a regular schedule (especially lunch).  If a meal is missed or significantly delayed, your blood sugar could go low.    check your blood sugar twice a day.  vary the time of day when you check, between before the 3 meals, and at bedtime.  also check if you have symptoms of your blood sugar being too high or too low.  please keep a record of the readings and bring it to your next appointment here (or you can bring the meter itself).  You can write it on any piece of paper.  please call us sooner if your blood sugar goes below 70, or if you have a lot of readings over 200. Please come back for a follow-up appointment in 1 month.
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
## 2018-11-03 ENCOUNTER — Encounter: Payer: Self-pay | Admitting: Internal Medicine

## 2018-11-03 ENCOUNTER — Ambulatory Visit (INDEPENDENT_AMBULATORY_CARE_PROVIDER_SITE_OTHER): Payer: Medicare Other | Admitting: Internal Medicine

## 2018-11-03 VITALS — BP 152/78 | HR 110 | Temp 98.7°F | Ht 63.0 in | Wt 264.0 lb

## 2018-11-03 DIAGNOSIS — G609 Hereditary and idiopathic neuropathy, unspecified: Secondary | ICD-10-CM

## 2018-11-03 DIAGNOSIS — F17200 Nicotine dependence, unspecified, uncomplicated: Secondary | ICD-10-CM

## 2018-11-03 DIAGNOSIS — R531 Weakness: Secondary | ICD-10-CM

## 2018-11-03 DIAGNOSIS — Z720 Tobacco use: Secondary | ICD-10-CM

## 2018-11-03 DIAGNOSIS — J40 Bronchitis, not specified as acute or chronic: Secondary | ICD-10-CM

## 2018-11-03 DIAGNOSIS — E119 Type 2 diabetes mellitus without complications: Secondary | ICD-10-CM | POA: Diagnosis not present

## 2018-11-03 MED ORDER — ENALAPRIL MALEATE 20 MG PO TABS: 20.0000 mg | ORAL_TABLET | Freq: Every day | ORAL | 3 refills | Status: DC

## 2018-11-03 MED ORDER — ALBUTEROL SULFATE HFA 108 (90 BASE) MCG/ACT IN AERS: 2.0000 | INHALATION_SPRAY | Freq: Four times a day (QID) | RESPIRATORY_TRACT | 5 refills | Status: DC | PRN

## 2018-11-03 MED ORDER — CLONIDINE HCL 0.2 MG PO TABS: 0.2000 mg | ORAL_TABLET | Freq: Two times a day (BID) | ORAL | 3 refills | Status: DC

## 2018-11-03 MED ORDER — TRIAMTERENE-HCTZ 37.5-25 MG PO TABS: 1.0000 | ORAL_TABLET | Freq: Every day | ORAL | 3 refills | Status: DC

## 2018-11-03 MED ORDER — AMLODIPINE BESYLATE 10 MG PO TABS: 10.0000 mg | ORAL_TABLET | Freq: Every day | ORAL | 3 refills | Status: DC

## 2018-11-03 MED ORDER — UMECLIDINIUM-VILANTEROL 62.5-25 MCG/INH IN AEPB: 1.0000 | INHALATION_SPRAY | Freq: Every day | RESPIRATORY_TRACT | 11 refills | Status: DC

## 2018-11-03 MED ORDER — LIDOCAINE 5 % EX PTCH: 1.0000 | MEDICATED_PATCH | CUTANEOUS | 0 refills | Status: DC

## 2018-11-03 NOTE — Assessment & Plan Note (Signed)
Rx for lidoderm patches. Cannot increase gabapentin. Can take tylenol as well otc.

## 2018-11-03 NOTE — Assessment & Plan Note (Signed)
She is describing these as associated with her falls and low sugars. Does not sound to be seizures. If the correction of hypoglycemia does not help she may need more extensive workup. She does have concurrent HIV.

## 2018-11-03 NOTE — Assessment & Plan Note (Signed)
Rx for anoro for breathing. Rx for albuterol inhaler as well for rescue. Counseled to quit smoking and she is not able to make an attempt currently.

## 2018-11-03 NOTE — Assessment & Plan Note (Signed)
She has just had adjustment of regimen with endocrinology and has been very hard to treat over the years. She will monitor sugars closely and let her endocrine provider know if still getting low sugars. She does associate those with her weakness and falls.

## 2018-11-03 NOTE — Assessment & Plan Note (Signed)
Advised to quit and she is not able at this time. Breathing is likely COPD at this time.

## 2018-11-03 NOTE — Patient Instructions (Signed)
We have sent in anoro for the breathing. Do 1 puff once a day everyday to help keep the breathing good.   We have sent in albuterol inhaler also to get if needed.   We have sent in a patch to help with pain in the back.   We will wait and see if the insulin change can help keep you from having low sugars. If you are still having low sugars you need to call Dr. Cordelia Pen office to adjust the insulin as these can be dangerous.

## 2018-11-03 NOTE — Progress Notes (Signed)
Subjective:   Patient ID: Susan [name], female    DOB: 1960-12-17, 58 y.o.   MRN: [id number]  HPI The patient is a 58 YO female coming in for several concerns including back pain (worsening, still taking her gabapentin, getting pain which shoots into her legs, causing numbness, she does use otc medications as well which do not help, denies new injury or overuse, no new numbness recently), low sugars (just saw her sugar specialist and they decreased her insulin, having readings of 30-40 multiple times recently, they seem to cause her to be weak and then feel bad for 1-2 days after the low sugar, most recent HgA1c >11, they have had a lot of difficulty getting her under control, diet stable) and recent falls (she associates the falls with low sugar readings as she gets very weak after low sugars and falls due to her whole body feeling like it cannot hold her up, lasts 1-2 days, she has to have family members get her up with these, 4 episodes in the last 1-2 weeks, denies injury or seeking care with falls, denies LOC with falls, denies confusion after episodes or falls, does eat something to help with sugars, does have chronic back pain and neuropathy but denies that this is related to falls, denies seizure like symptoms). Does also have current worsening of SOB (still smoking, denies cough or wheezing, stable sinus drainage, she does have albuterol but it is powder and she feels she is not getting the medicine, has been out of advair for a long time, not taking any daily inhalers for breathing).   Review of Systems  Constitutional: Positive for activity change and fatigue. Negative for appetite change, chills, fever and unexpected weight change.  HENT: Positive for congestion, postnasal drip and rhinorrhea.   Eyes: Negative.   Respiratory: Positive for cough and shortness of breath. Negative for chest tightness.   Cardiovascular: Negative.  Negative for chest pain, palpitations and leg swelling.    Gastrointestinal: Negative.  Negative for abdominal distention, abdominal pain, constipation, diarrhea, nausea and vomiting.  Musculoskeletal: Positive for arthralgias, back pain and myalgias. Negative for gait problem and joint swelling.  Skin: Negative.   Neurological: Positive for weakness.  Psychiatric/Behavioral: Negative.     Objective:  Physical Exam Constitutional:      Appearance: She is well-developed. She is obese.  HENT:     Head: Normocephalic and atraumatic.  Neck:     Musculoskeletal: Normal range of motion.  Cardiovascular:     Rate and Rhythm: Normal rate and regular rhythm.  Pulmonary:     Effort: Pulmonary effort is normal. No respiratory distress.     Breath sounds: Rhonchi present. No wheezing or rales.     Comments: Stable lung exam from prior Abdominal:     General: Bowel sounds are normal. There is no distension.     Palpations: Abdomen is soft.     Tenderness: There is no abdominal tenderness. There is no rebound.  Musculoskeletal:        General: Tenderness present.     Comments: Pain lumbar  Skin:    General: Skin is warm and dry.  Neurological:     Mental Status: She is alert and oriented to person, place, and time.     Coordination: Coordination abnormal.     Comments: Slow to stand but steady gait once moving     Vitals:   11/03/18 0905  BP: (!) 152/78  Pulse: (!) 110  Temp: 98.7 F (37.1 C)  TempSrc: Oral  SpO2: 94%  Weight: 264 lb (119.7 kg)  Height: 5\' 3"  (1.6 m)    Assessment & Plan:

## 2018-11-04 ENCOUNTER — Other Ambulatory Visit: Payer: Medicare Other

## 2018-11-04 ENCOUNTER — Ambulatory Visit: Payer: Self-pay | Admitting: Internal Medicine

## 2018-11-18 ENCOUNTER — Encounter: Payer: Medicare Other | Admitting: Internal Medicine

## 2018-11-27 ENCOUNTER — Ambulatory Visit: Payer: Self-pay | Admitting: Endocrinology

## 2018-12-01 ENCOUNTER — Telehealth: Payer: Self-pay

## 2018-12-01 ENCOUNTER — Ambulatory Visit: Payer: Self-pay | Admitting: *Deleted

## 2018-12-01 ENCOUNTER — Emergency Department (HOSPITAL_COMMUNITY): Payer: Medicare Other

## 2018-12-01 ENCOUNTER — Emergency Department (HOSPITAL_COMMUNITY)
Admission: EM | Admit: 2018-12-01 | Discharge: 2018-12-02 | Disposition: A | Discharge status: Home / Self Care | Payer: Medicare Other | Attending: Emergency Medicine | Admitting: Emergency Medicine

## 2018-12-01 ENCOUNTER — Other Ambulatory Visit: Payer: Self-pay

## 2018-12-01 ENCOUNTER — Encounter (HOSPITAL_COMMUNITY): Payer: Self-pay | Admitting: Family Medicine

## 2018-12-01 ENCOUNTER — Ambulatory Visit: Payer: Self-pay

## 2018-12-01 DIAGNOSIS — F329 Major depressive disorder, single episode, unspecified: Secondary | ICD-10-CM | POA: Insufficient documentation

## 2018-12-01 DIAGNOSIS — R05 Cough: Secondary | ICD-10-CM | POA: Diagnosis present

## 2018-12-01 DIAGNOSIS — Z7984 Long term (current) use of oral hypoglycemic drugs: Secondary | ICD-10-CM | POA: Diagnosis not present

## 2018-12-01 DIAGNOSIS — Z21 Asymptomatic human immunodeficiency virus [HIV] infection status: Secondary | ICD-10-CM | POA: Insufficient documentation

## 2018-12-01 DIAGNOSIS — I10 Essential (primary) hypertension: Secondary | ICD-10-CM | POA: Diagnosis not present

## 2018-12-01 DIAGNOSIS — Z8673 Personal history of transient ischemic attack (TIA), and cerebral infarction without residual deficits: Secondary | ICD-10-CM | POA: Diagnosis not present

## 2018-12-01 DIAGNOSIS — Z008 Encounter for other general examination: Secondary | ICD-10-CM

## 2018-12-01 DIAGNOSIS — Z79899 Other long term (current) drug therapy: Secondary | ICD-10-CM | POA: Insufficient documentation

## 2018-12-01 DIAGNOSIS — R260 Ataxic gait: Secondary | ICD-10-CM | POA: Insufficient documentation

## 2018-12-01 DIAGNOSIS — R000 Tachycardia, unspecified: Secondary | ICD-10-CM | POA: Diagnosis not present

## 2018-12-01 DIAGNOSIS — R45851 Suicidal ideations: Secondary | ICD-10-CM | POA: Insufficient documentation

## 2018-12-01 DIAGNOSIS — E119 Type 2 diabetes mellitus without complications: Secondary | ICD-10-CM | POA: Diagnosis not present

## 2018-12-01 DIAGNOSIS — G3281 Cerebellar ataxia in diseases classified elsewhere: Secondary | ICD-10-CM | POA: Diagnosis not present

## 2018-12-01 DIAGNOSIS — J45909 Unspecified asthma, uncomplicated: Secondary | ICD-10-CM | POA: Diagnosis not present

## 2018-12-01 DIAGNOSIS — F17210 Nicotine dependence, cigarettes, uncomplicated: Secondary | ICD-10-CM | POA: Insufficient documentation

## 2018-12-01 DIAGNOSIS — E860 Dehydration: Secondary | ICD-10-CM

## 2018-12-01 DIAGNOSIS — M25562 Pain in left knee: Secondary | ICD-10-CM | POA: Diagnosis not present

## 2018-12-01 DIAGNOSIS — M25561 Pain in right knee: Secondary | ICD-10-CM | POA: Diagnosis not present

## 2018-12-01 DIAGNOSIS — F1210 Cannabis abuse, uncomplicated: Secondary | ICD-10-CM

## 2018-12-01 LAB — CBC
HEMATOCRIT: 55.3 % — AB (ref 36.0–46.0)
HEMOGLOBIN: 18.4 g/dL — AB (ref 12.0–15.0)
MCH: 31.5 pg (ref 26.0–34.0)
MCHC: 33.3 g/dL (ref 30.0–36.0)
MCV: 94.5 fL (ref 80.0–100.0)
Platelets: 284 10*3/uL (ref 150–400)
RBC: 5.85 MIL/uL — ABNORMAL HIGH (ref 3.87–5.11)
RDW: 13.7 % (ref 11.5–15.5)
WBC: 10.8 10*3/uL — ABNORMAL HIGH (ref 4.0–10.5)
nRBC: 0 % (ref 0.0–0.2)

## 2018-12-01 LAB — COMPREHENSIVE METABOLIC PANEL
ALT: 13 U/L (ref 0–44)
AST: 16 U/L (ref 15–41)
Albumin: 3.6 g/dL (ref 3.5–5.0)
Alkaline Phosphatase: 95 U/L (ref 38–126)
Anion gap: 11 (ref 5–15)
BILIRUBIN TOTAL: 0.6 mg/dL (ref 0.3–1.2)
BUN: 10 mg/dL (ref 6–20)
CO2: 32 mmol/L (ref 22–32)
Calcium: 9.1 mg/dL (ref 8.9–10.3)
Chloride: 96 mmol/L — ABNORMAL LOW (ref 98–111)
Creatinine, Ser: 0.8 mg/dL (ref 0.44–1.00)
GFR calc Af Amer: >60 mL/min (ref >60)
GFR calc non Af Amer: >60 mL/min (ref >60)
GLUCOSE: 119 mg/dL — AB (ref 70–99)
Potassium: 3.3 mmol/L — ABNORMAL LOW (ref 3.5–5.1)
Sodium: 139 mmol/L (ref 135–145)
Total Protein: 7.6 g/dL (ref 6.5–8.1)

## 2018-12-01 LAB — ACETAMINOPHEN LEVEL: Acetaminophen (Tylenol), Serum: <10 ug/mL — ABNORMAL LOW (ref 10–30)

## 2018-12-01 LAB — ETHANOL: Alcohol, Ethyl (B): <10 mg/dL (ref <10)

## 2018-12-01 LAB — SALICYLATE LEVEL: Salicylate Lvl: <7 mg/dL (ref 2.8–30.0)

## 2018-12-01 IMAGING — CT CT HEAD WITHOUT CONTRAST
3 series · 15 of 47 positions shown, 18 images · non-contrast
Comparison: None.

CLINICAL DATA: Ataxia

EXAM:
CT HEAD WITHOUT CONTRAST
TECHNIQUE: Contiguous axial images were obtained from the base of the skull
through the vertex without intravenous contrast.

[Series 2: head wo · axial · 0.42mm/px · z∈[-120,+5]mm · 9 of 31 slices shown, 12 images]
[im 3/31  brain]
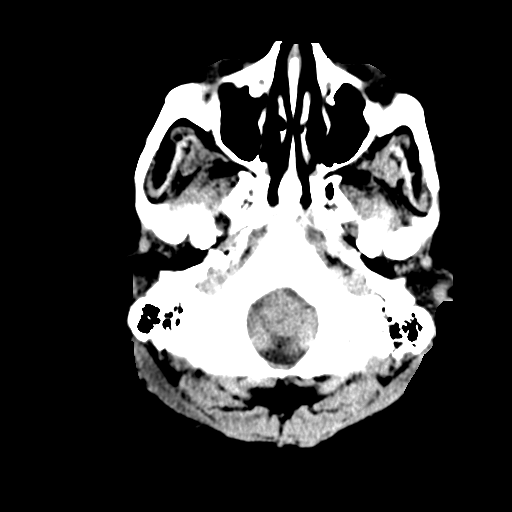
[im 3/31  bone]
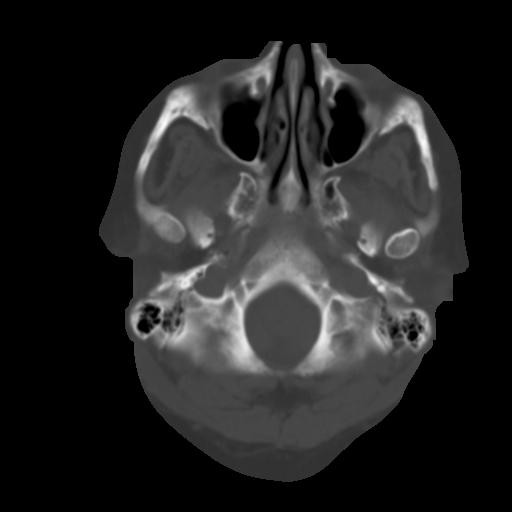
[im 6/31  brain]
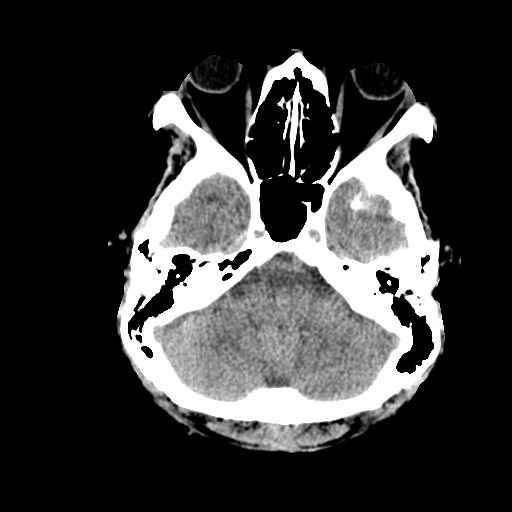
[im 9/31  brain]
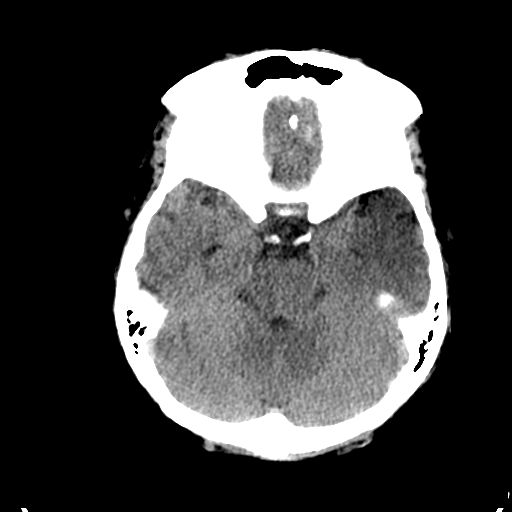
[im 12/31  brain]
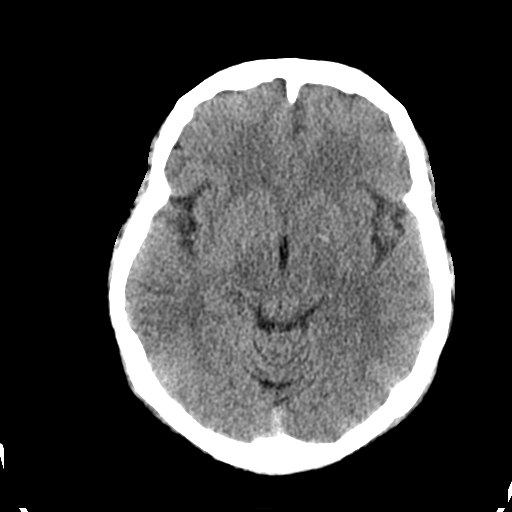
[im 16/31  brain]
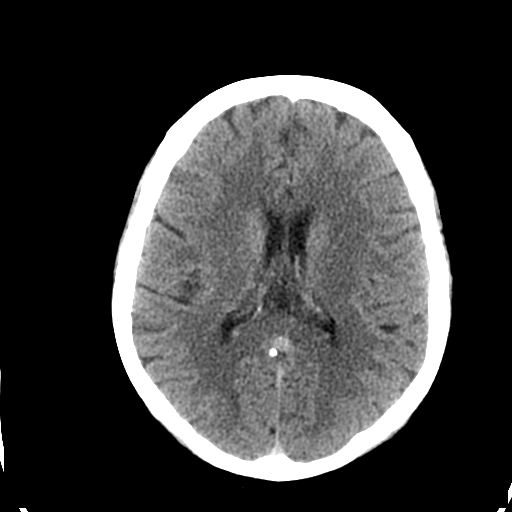
[im 16/31  bone]
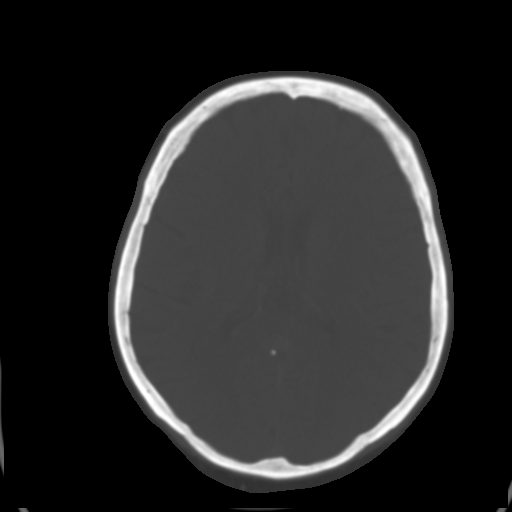
[im 19/31  brain]
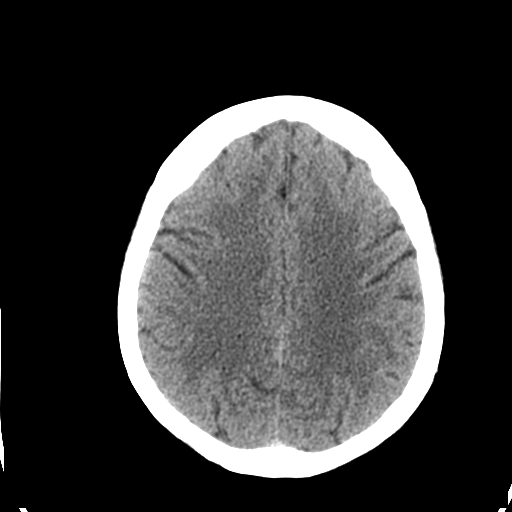
[im 22/31  brain]
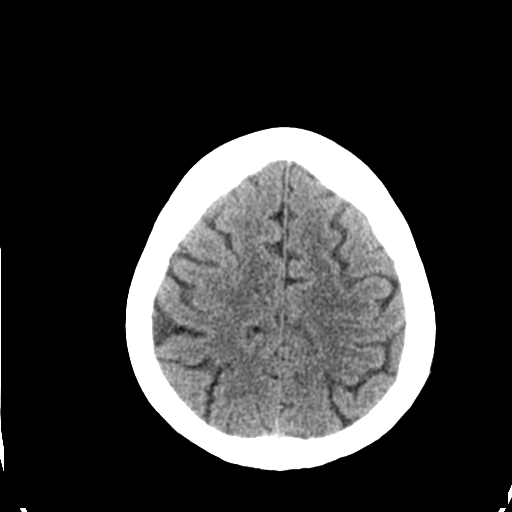
[im 25/31  brain]
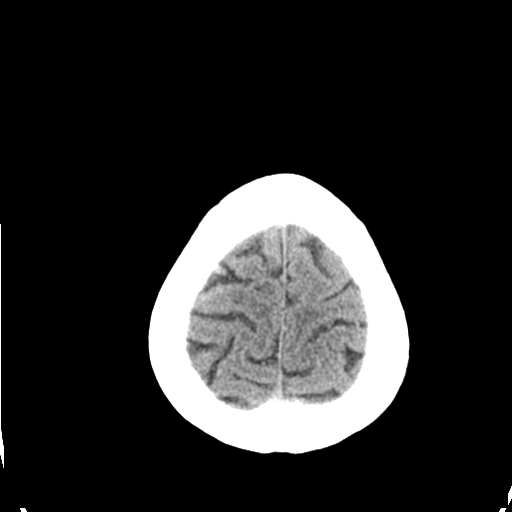
[im 28/31  brain]
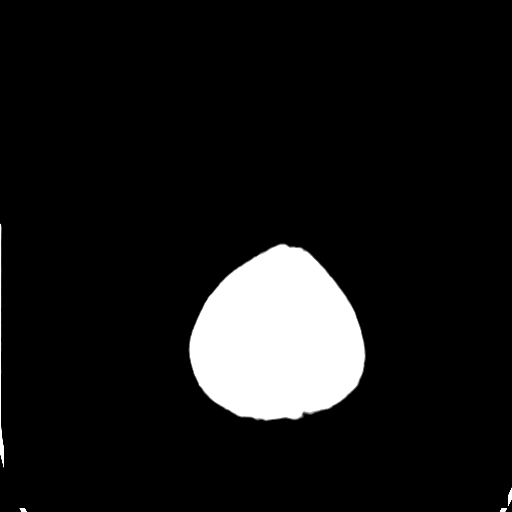
[im 28/31  bone]
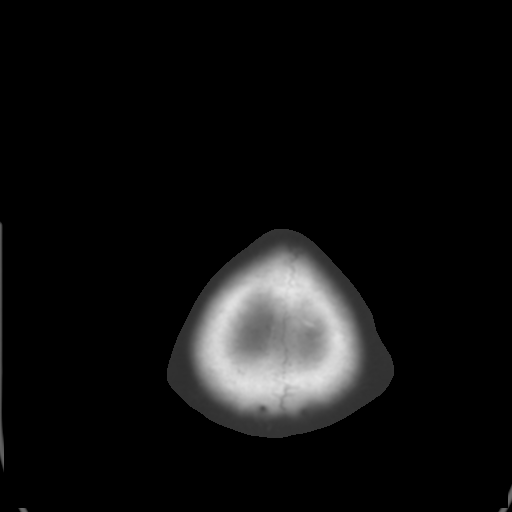

[Series 5: coronal soft tissue · coronal · 0.29mm/px · 3 of 66 slices shown]
[im 22/66  brain]
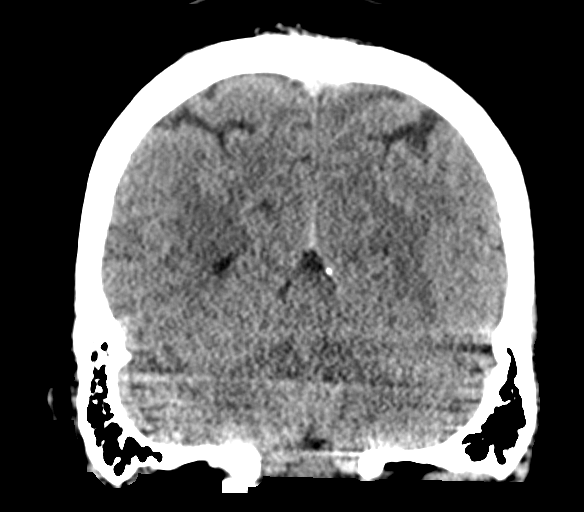
[im 29/66  brain]
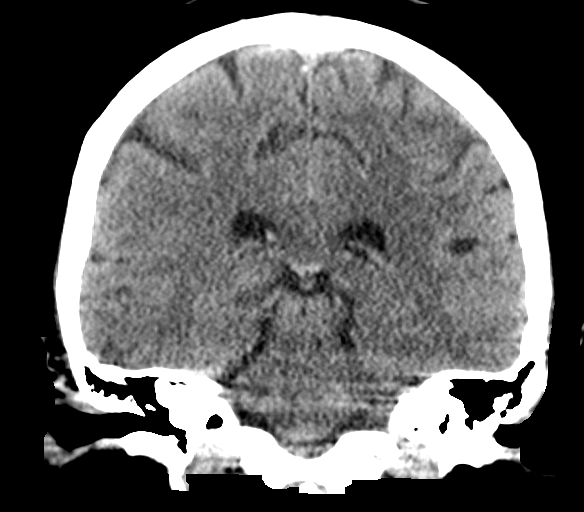
[im 37/66  brain]
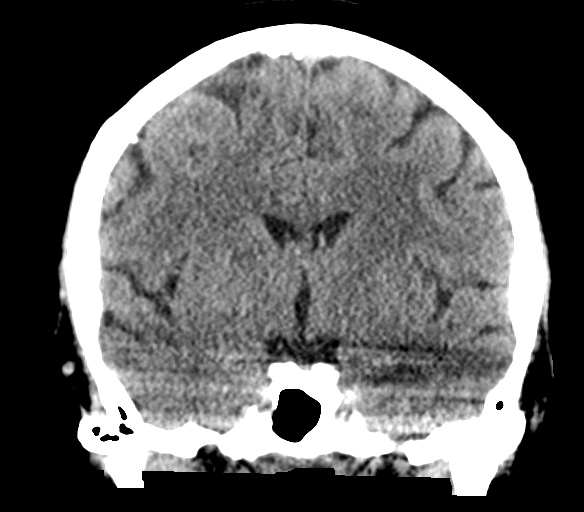

[Series 6: sagittal soft tissue · sagittal · 0.29mm/px · 3 of 57 slices shown]
[im 19/57  brain]
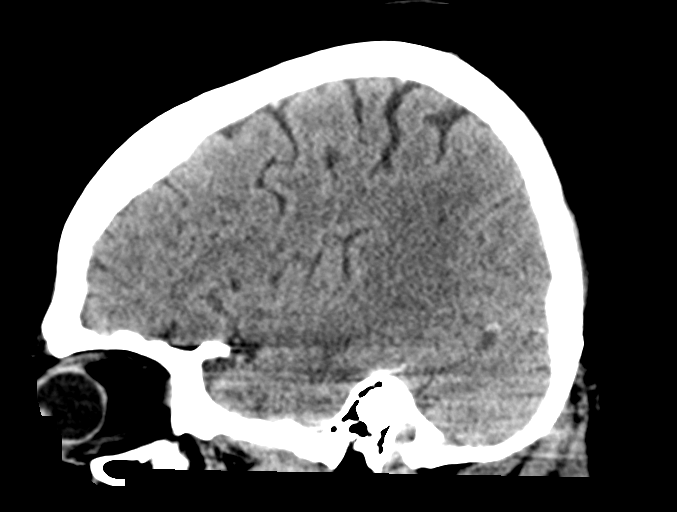
[im 29/57  brain]
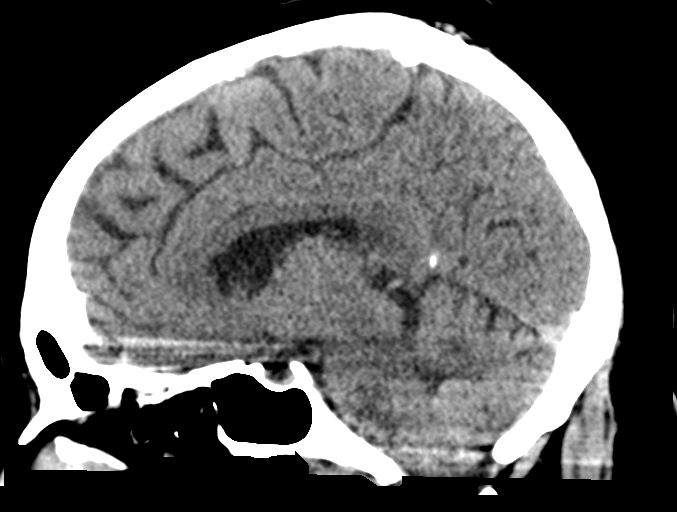
[im 38/57  brain]
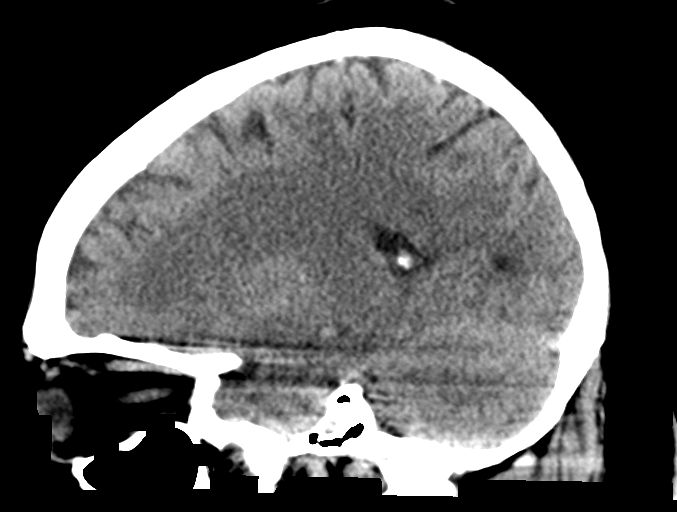

[15 of 47 positions shown; findings below may reference images not displayed]

FINDINGS: Brain: There is no mass, hemorrhage or extra-axial collection. The
size and configuration of the ventricles and extra-axial CSF spaces
are normal. The brain parenchyma is normal, without acute or chronic
infarction.

Vascular: No abnormal hyperdensity of the major intracranial
arteries or dural venous sinuses. No intracranial atherosclerosis.

Skull: The visualized skull base, calvarium and extracranial soft
tissues are normal.

Sinuses/Orbits: No fluid levels or advanced mucosal thickening of
the visualized paranasal sinuses. No mastoid or middle ear effusion.
The orbits are normal.
IMPRESSION: Normal head CT.

## 2018-12-01 IMAGING — CR LEFT KNEE - COMPLETE 4+ VIEW
4 series · 4 of 4 positions shown · non-contrast
Comparison: Left knee x-rays dated [DATE].

CLINICAL DATA: Knee pain after fall 2 days ago.

EXAM:
LEFT KNEE - COMPLETE 4+ VIEW

[x knee ap left]
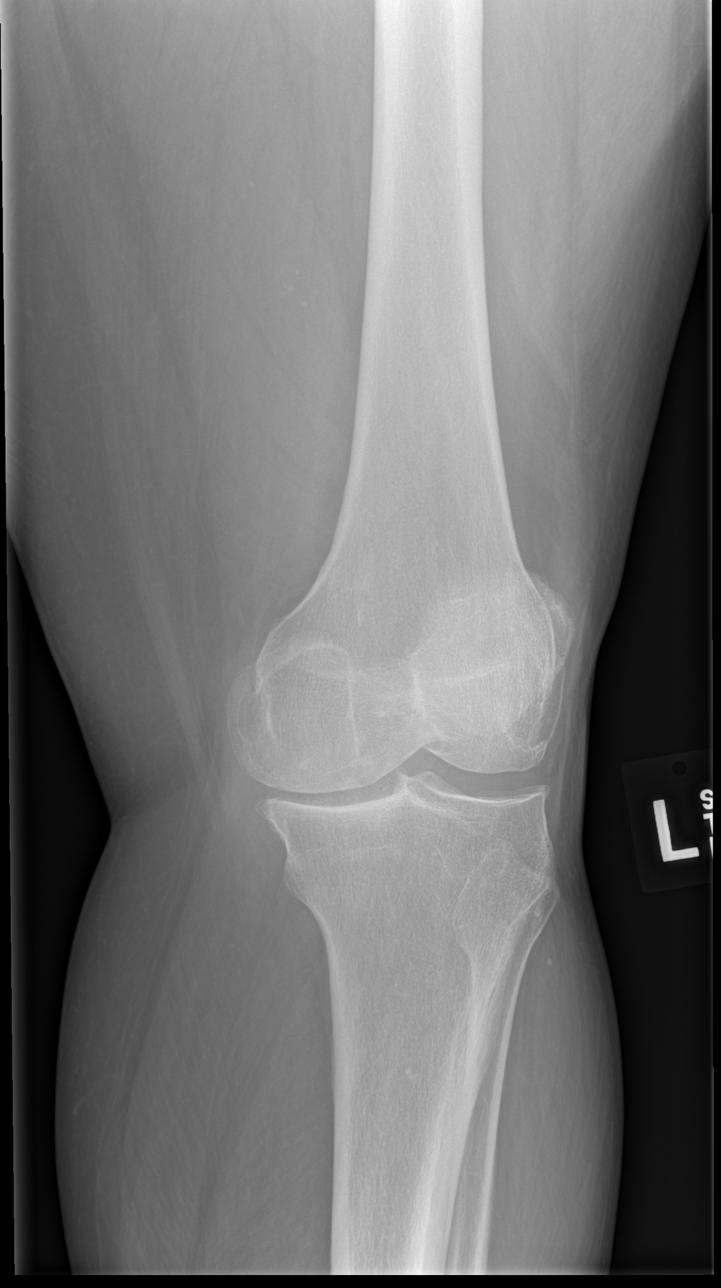

[x knee obl left (1 of 2)]
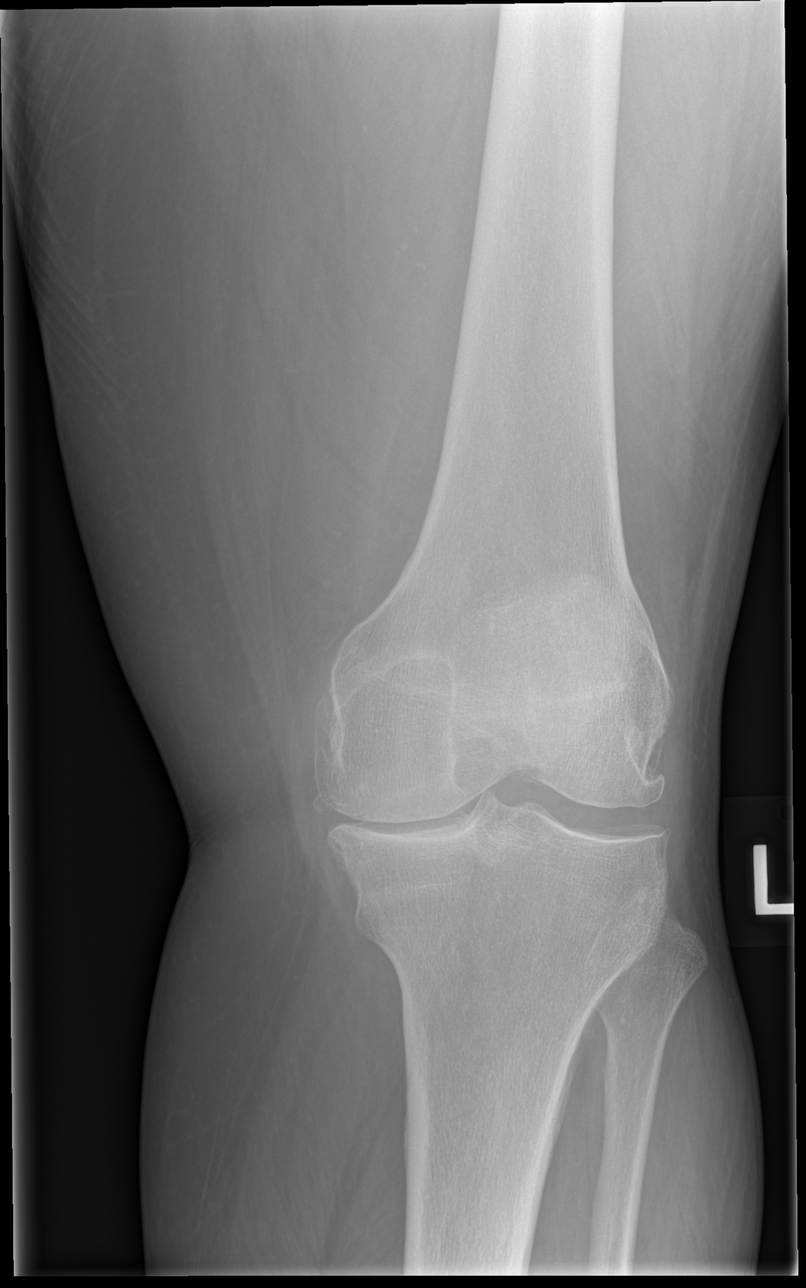

[x knee obl left (2 of 2)]
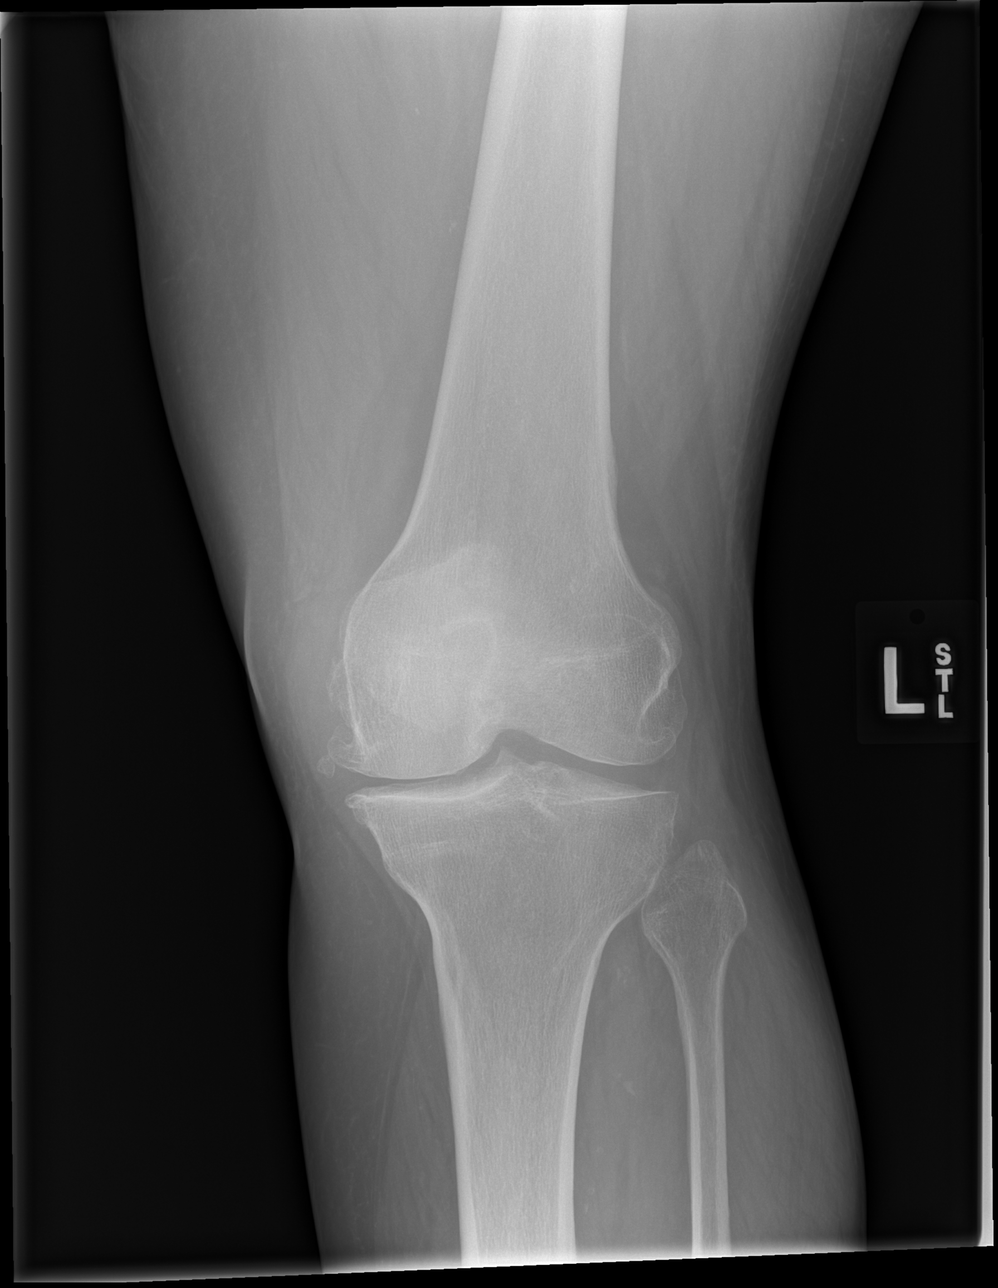

[x knee lat left]
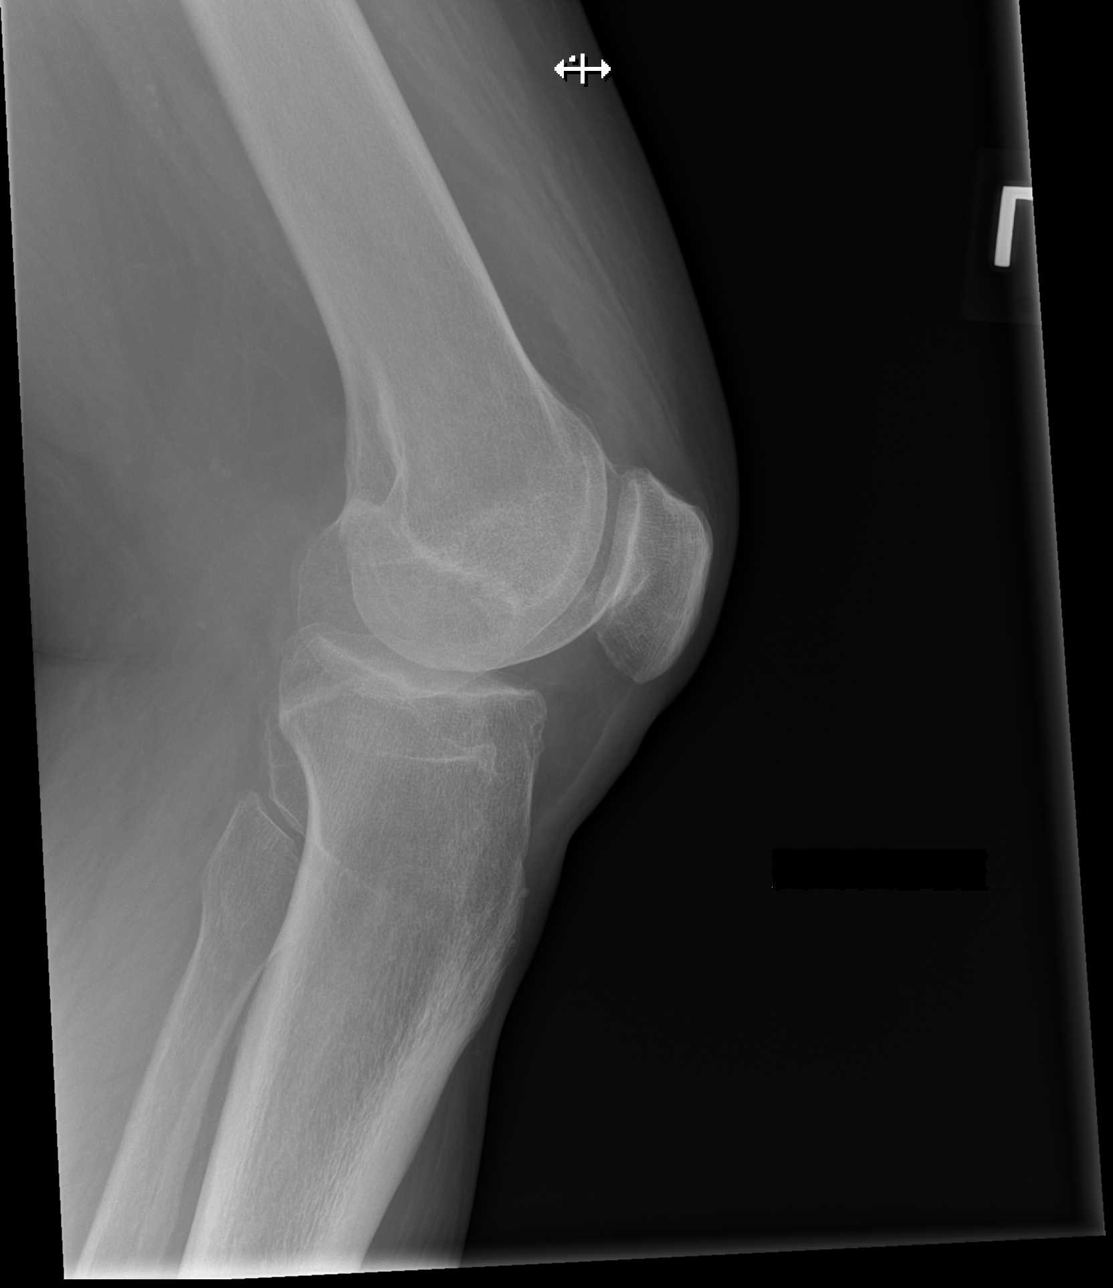

[4 of 4 positions shown; findings below may reference images not displayed]

FINDINGS: No acute fracture or dislocation. No joint effusion. Unchanged
moderate medial compartment joint space narrowing and
tricompartmental osteophytes. Bone mineralization is normal. Soft
tissues are unremarkable.
IMPRESSION: 1.  No acute osseous abnormality.
2. Tricompartmental degenerative changes, moderate in the medial
compartment, similar to prior study.

## 2018-12-01 IMAGING — CR RIGHT KNEE - COMPLETE 4+ VIEW
4 series · 4 of 4 positions shown · non-contrast
Comparison: Right knee x-rays dated [DATE].

CLINICAL DATA: Knee pain after fall 2 days ago.

EXAM:
RIGHT KNEE - COMPLETE 4+ VIEW

[x knee ap right]
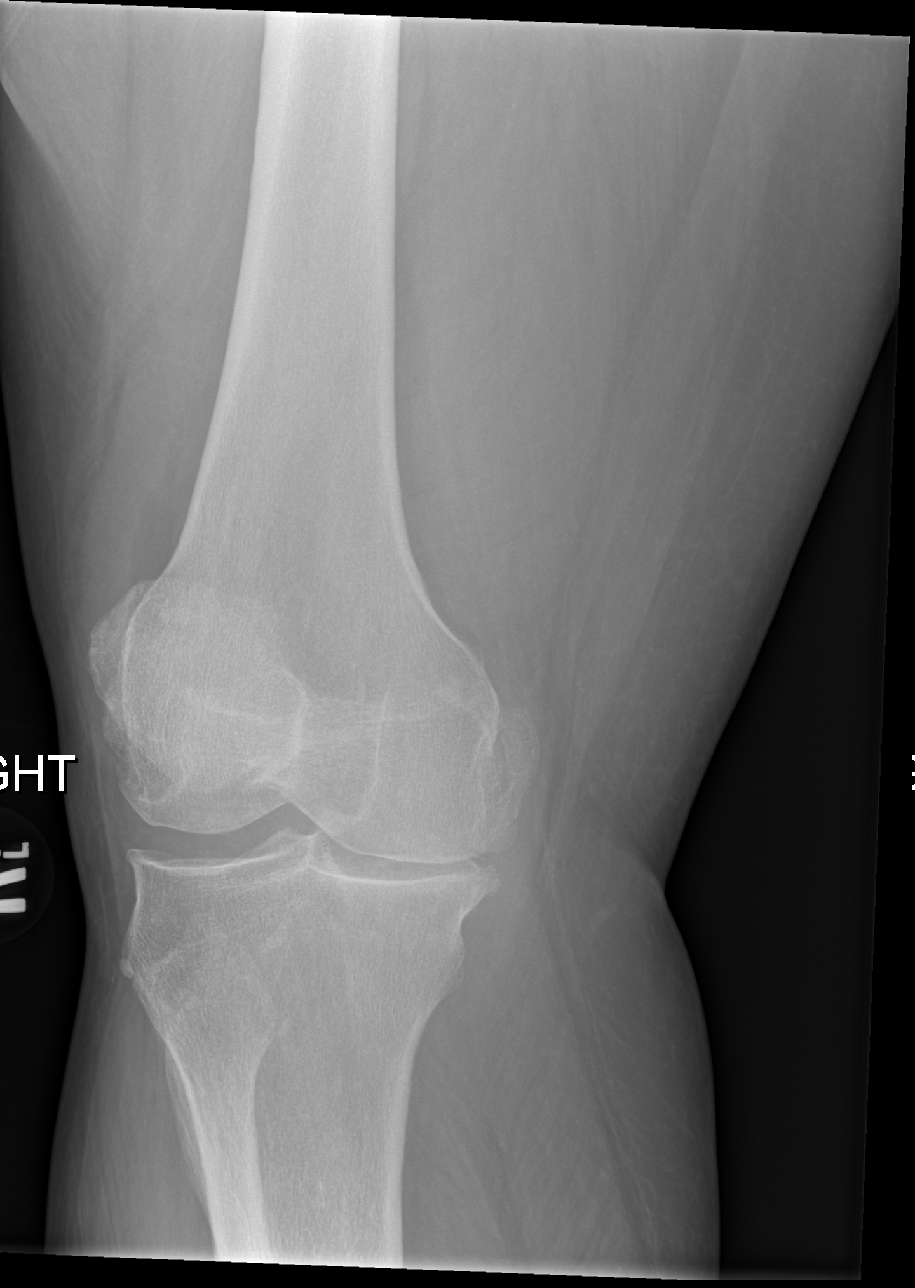

[x knee obl right (1 of 2)]
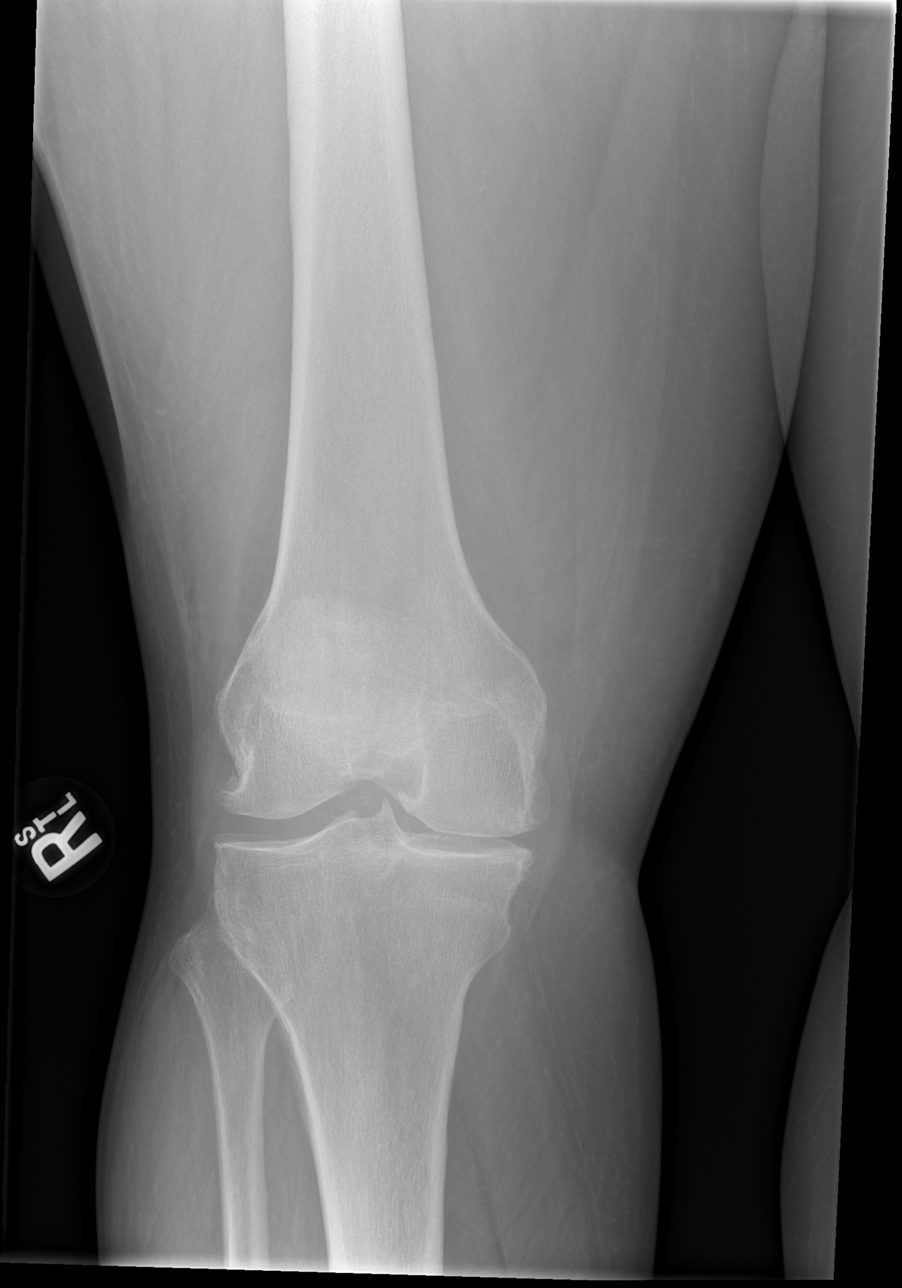

[x knee obl right (2 of 2)]
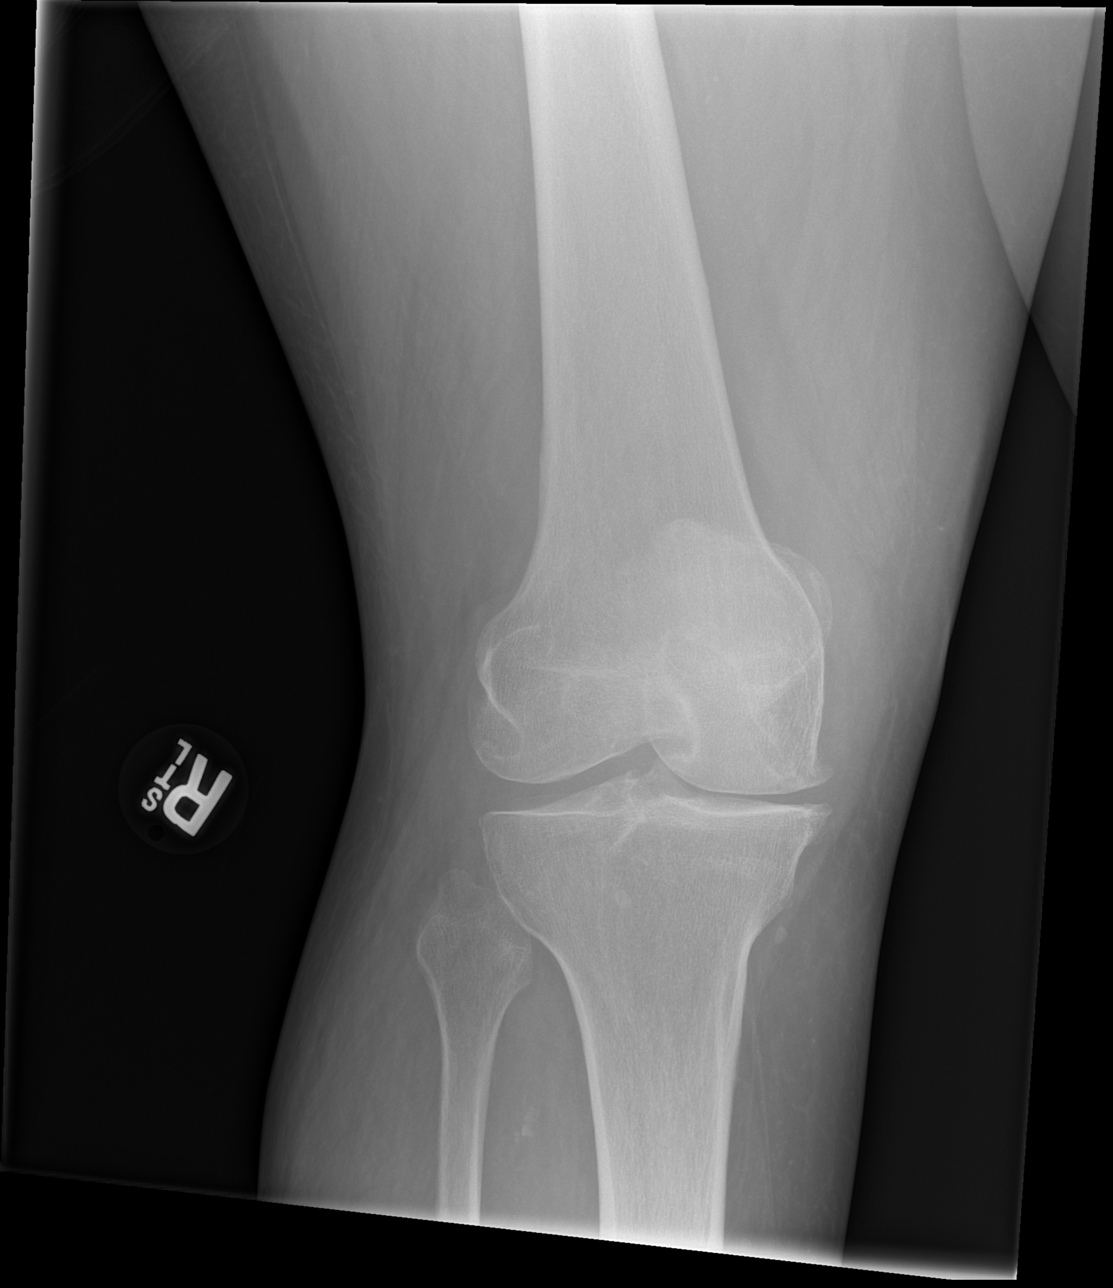

[x knee lat right]
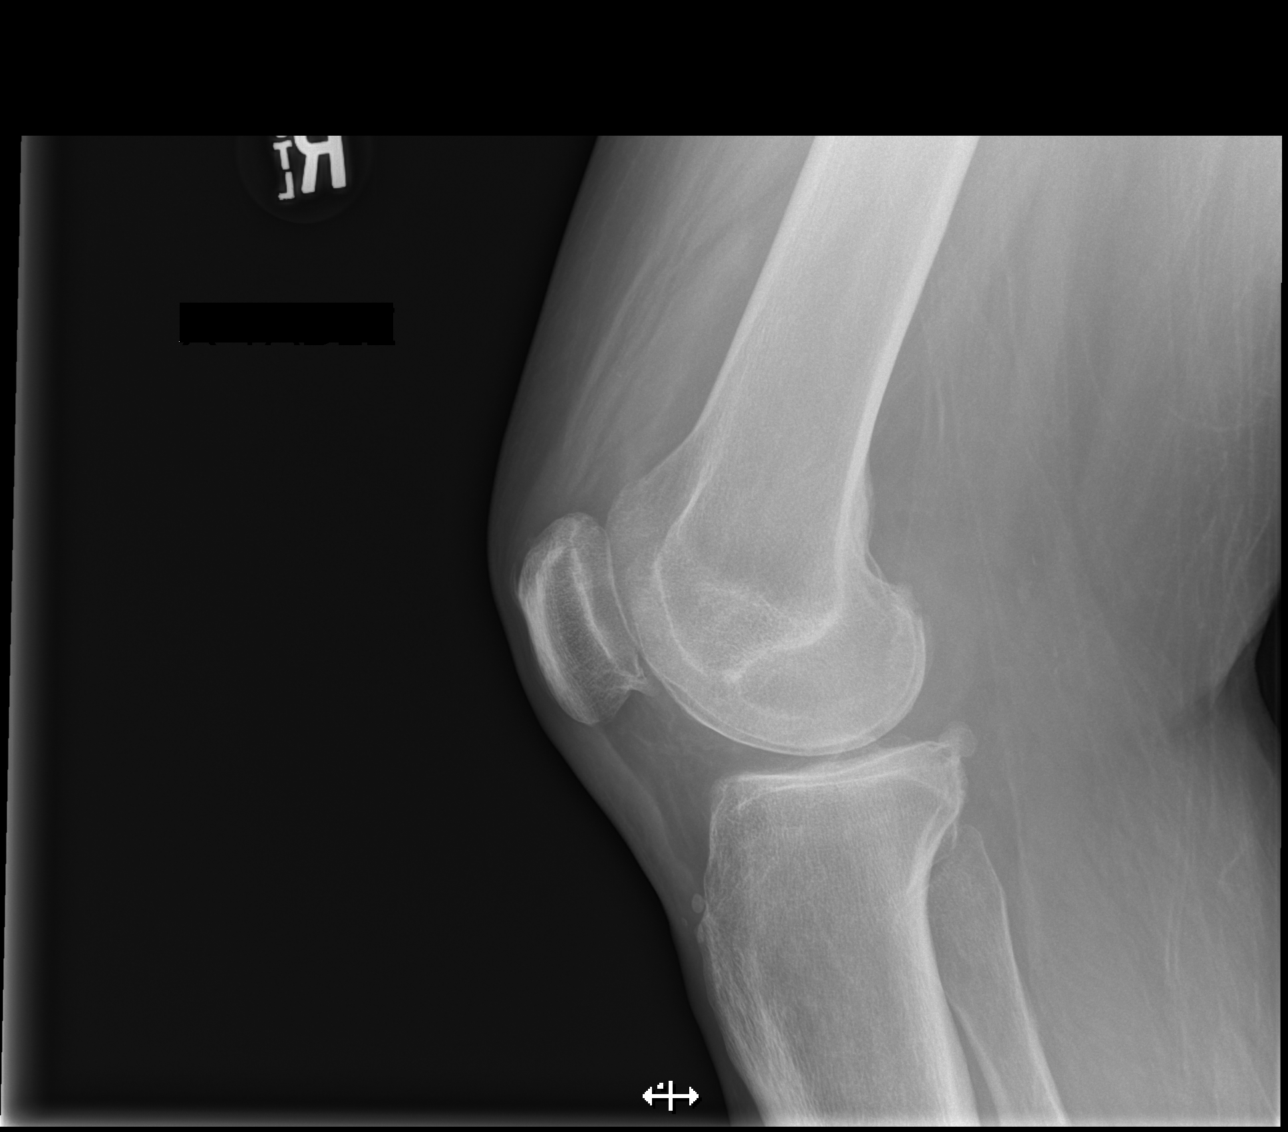

[4 of 4 positions shown; findings below may reference images not displayed]

FINDINGS: No acute fracture or dislocation. Unchanged small joint effusion.
Unchanged moderate medial compartment joint space narrowing.
Tricompartmental osteophytes again noted, with increased spurring of
the inferior patella. Bone mineralization is normal. Soft tissues
are unremarkable.
IMPRESSION: 1.  No acute osseous abnormality.
2. Tricompartmental degenerative changes, moderate in the medial
compartment, similar to prior study.

## 2018-12-01 MED ORDER — SODIUM CHLORIDE 0.9 % IV BOLUS
500.0000 mL | Freq: Once | INTRAVENOUS | Status: AC
  Administered 2018-12-01: 500 mL via INTRAVENOUS

## 2018-12-01 NOTE — Telephone Encounter (Signed)
LVM for patient to call back and let us know more about what happened. How patient fell, what caused patient to fall, where did patient fall, what/did patient hit anything while falling, did patient hit head in the fall, what/where is the pain worse at, is patient having any dizziness, nausea, or vomiting. Advised patient if they did hit their head and there is any nausea, dzziness, or vomiting to seek medical attention immediatly

## 2018-12-01 NOTE — ED Notes (Signed)
Bed: WA09 Expected date:  Expected time:  Means of arrival:  Comments: Arm pain-SI
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
## 2018-12-01 NOTE — ED Triage Notes (Signed)
Patient is from home and transported via [facility]. Patient states she fell 2 days and complaining of right knee pain. While EMS attempted to ambulate patient, she fell and complains of weakness everywhere. Also during transport, patient complained of suicidal thoughts.

## 2018-12-01 NOTE — Telephone Encounter (Signed)
Call placed to patient who states she fell 2 days ago. She states that she just feels weak and her legs gave out. She says she did not hit her head she has no dizziness or nausea.  Her right knee is swollen. Her elbows hurt.  She states that the last time she felt this way her sugars were off. BS after lunch 241. Pt states she has to have help to get to the bathroom. She is unable to walk without help.  She is requesting some in home help.  She states that she also would like meals on wheels arranged. Per practice protocol call was routed to [facility]. Pt was instructed that she should get a call back from office about her request. Pt is unable to do Web Ex. Phone call only.  Reason for Disposition . [1] MODERATE weakness (i.e., interferes with work, school, normal activities) AND [2] cause unknown  (Exceptions: weakness with acute minor illness, or weakness from poor fluid intake)  Answer Assessment - Initial Assessment Questions 1. DESCRIPTION: "Describe how you are feeling."     Very weak 2. SEVERITY: "How bad is it?"  "Can you stand and walk?"   - MILD - Feels weak or tired, but does not interfere with work, school or normal activities   - [place] to stand and walk; weakness interferes with work, school, or normal activities   - SEVERE - Unable to stand or walk    Unable to stand without help 3. ONSET:  "When did the weakness begin?"     1 week 4. CAUSE: "What do you think is causing the weakness?"     Possible sugar 5. MEDICINES: "Have you recently started a new medicine or had a change in the amount of a medicine?"     no 6. OTHER SYMPTOMS: "Do you have any other symptoms?" (e.g., chest pain, fever, cough, SOB, vomiting, diarrhea, bleeding, other areas of pain)     no 7. PREGNANCY: "Is there any chance you are pregnant?" "When was your last menstrual period?"     N/A  Protocols used: WEAKNESS (GENERALIZED) AND FATIGUE-A-AH

## 2018-12-01 NOTE — ED Notes (Signed)
Pt placed on purewick and reminded that urine is needed.

## 2018-12-01 NOTE — Telephone Encounter (Signed)
Returned call to the pt who voiced suicidal thoughts to [facility] agent but ended the call before being transferred to a nurse. Pt states that she is currently having suicidal ideations,seeing shadows and hearing voices. Pt states she does see Dr. Josph Macho at [facility] but has not been able to get in contact with anyone. Pt has a history of Bipolar I disorder and schizophrenia.Pt states she wants to harm herself. "If I had the opportunity I would go to sleep and not wake up". Pt does not have weapons. Pt states she is confined to her room and can not move much.  Pt states "I do not want to be here right now". Pt states she fell backwards two days ago and then states "I can not handle this stuff by myself". Conference call initiated with 911 and the pt. Pt tells 911 dispatcher "I am feeling violent". Daughter is in the home with the patient. Pt states I go through this (suicidal thoughts) once a year(suicidal). "i'm good but i'm not good enough right now". Call ended once it was confirmed that police officers were in the home with the pt.   Reason for Disposition . Patient is threatening suicide now  Answer Assessment - Initial Assessment Questions 1. CONCERN: "What happened that made you call today?"      Pt states she fell backwards 2 days ago and states she feels awful and can't get up to get around 2. SUICIDE ATTEMPT: "Have you tried to harm yourself recently?"  "Have you ever tried to harm yourself before?"     Has not tried to harm herself today because she states she can't get to anything 3. RISK OF HARM - SUICIDAL IDEATION:  "Do you ever have thoughts of hurting or killing yourself?"  (e.g., yes, no, no but preoccupation with thoughts about death)   - INTENT:  "Do you have thoughts of hurting or killing yourself right NOW?" (e.g., yes, no, N/A)   - PLAN: "Do you have a specific plan for how you would do this?" (e.g., gun, knife, overdose, no plan, N/A)     Yes pt states she has suicidal thoughts at this  time and if she had the opportunity she would go to sleep and not wake up 4. RISK OF HARM - HOMICIDAL IDEATION:  "Do you ever have thoughts of hurting or killing someone else?"  (e.g., yes, no, no but preoccupation with thoughts about death)   - INTENT:  "Do you have thoughts of hurting or killing someone right NOW?" (e.g., yes, no, N/A)   - PLAN: "Do you have a specific plan for how you would do this?" (e.g., gun, knife, no plan, N/A)      No specific plan but states she would like to go to sleep and not wake up 5. ACCESS: If yes to PLAN, "Do you have access to weapons?" (e.g., pills stored in bathroom, firearm in house, knife in kitchen)     Pt states she does not have weapons in the home and is unable to get up to do anything at this time due to falling 2 days ago 6. SUPPORT: "Who is with you now?" "Who do you live with?" "Do you have family or friends nearby who you can talk to?"      Pt states she can talk to her daughter who is in the home with the pt at the time of the call 7. THERAPIST: "Do you have a counselor or therapist? Name?"     Dr.  Fred at [place] 8. STRESSORS: "Has there been any new stress or recent changes in your life?"     Fell 2 days ago 9. DRUG ABUSE/ALCOHOL: "Do you drink alcohol or use any illegal drugs?"      Not assessed 10. OTHER: "Do you have any other health or medical symptoms right now?" (e.g., fever)       None voiced at this time 11. PREGNANCY: "Is there any chance you are pregnant?" "When was your last menstrual period?"       Not assessed  Protocols used: SUICIDE CONCERNS-A-AH

## 2018-12-01 NOTE — ED Notes (Addendum)
Removed patients belongings and her jewerly from her. Placed in two white belongings bags and placed them in the counter at 23-25 area.

## 2018-12-01 NOTE — Telephone Encounter (Signed)
Copied from [place] [id number]. Topic: General - Other >> Dec 01, 2018  3:41 PM Gustavus Messing wrote: Reason for CRM: Patient would like the Dr. to know that she fell and and is hurting all over but does not want to go to the hospital or an urgent care. She would like a call back urgently

## 2018-12-02 ENCOUNTER — Emergency Department (HOSPITAL_COMMUNITY): Payer: Medicare Other

## 2018-12-02 DIAGNOSIS — F329 Major depressive disorder, single episode, unspecified: Secondary | ICD-10-CM | POA: Diagnosis not present

## 2018-12-02 DIAGNOSIS — R05 Cough: Secondary | ICD-10-CM | POA: Diagnosis not present

## 2018-12-02 LAB — RAPID URINE DRUG SCREEN, HOSP PERFORMED
Amphetamines: NOT DETECTED
Barbiturates: NOT DETECTED
Benzodiazepines: NOT DETECTED
Cocaine: NOT DETECTED
Opiates: NOT DETECTED
Tetrahydrocannabinol: POSITIVE — AB

## 2018-12-02 LAB — URINALYSIS, ROUTINE W REFLEX MICROSCOPIC
Bilirubin Urine: NEGATIVE
Glucose, UA: NEGATIVE mg/dL
Hgb urine dipstick: NEGATIVE
Ketones, ur: NEGATIVE mg/dL
Leukocytes,Ua: NEGATIVE
Nitrite: NEGATIVE
PH: 5 (ref 5.0–8.0)
Protein, ur: NEGATIVE mg/dL
Specific Gravity, Urine: 1.016 (ref 1.005–1.030)

## 2018-12-02 LAB — CBG MONITORING, ED: Glucose-Capillary: 184 mg/dL — ABNORMAL HIGH (ref 70–99)

## 2018-12-02 IMAGING — CR CHEST - 2 VIEW
2 series · 2 of 2 positions shown · non-contrast
Comparison: [DATE]

CLINICAL DATA: Suicidal ideations, cough, congestion, shortness of
breath, smoker, hypertension, weakness

EXAM:
CHEST - 2 VIEW

[w chest lat]
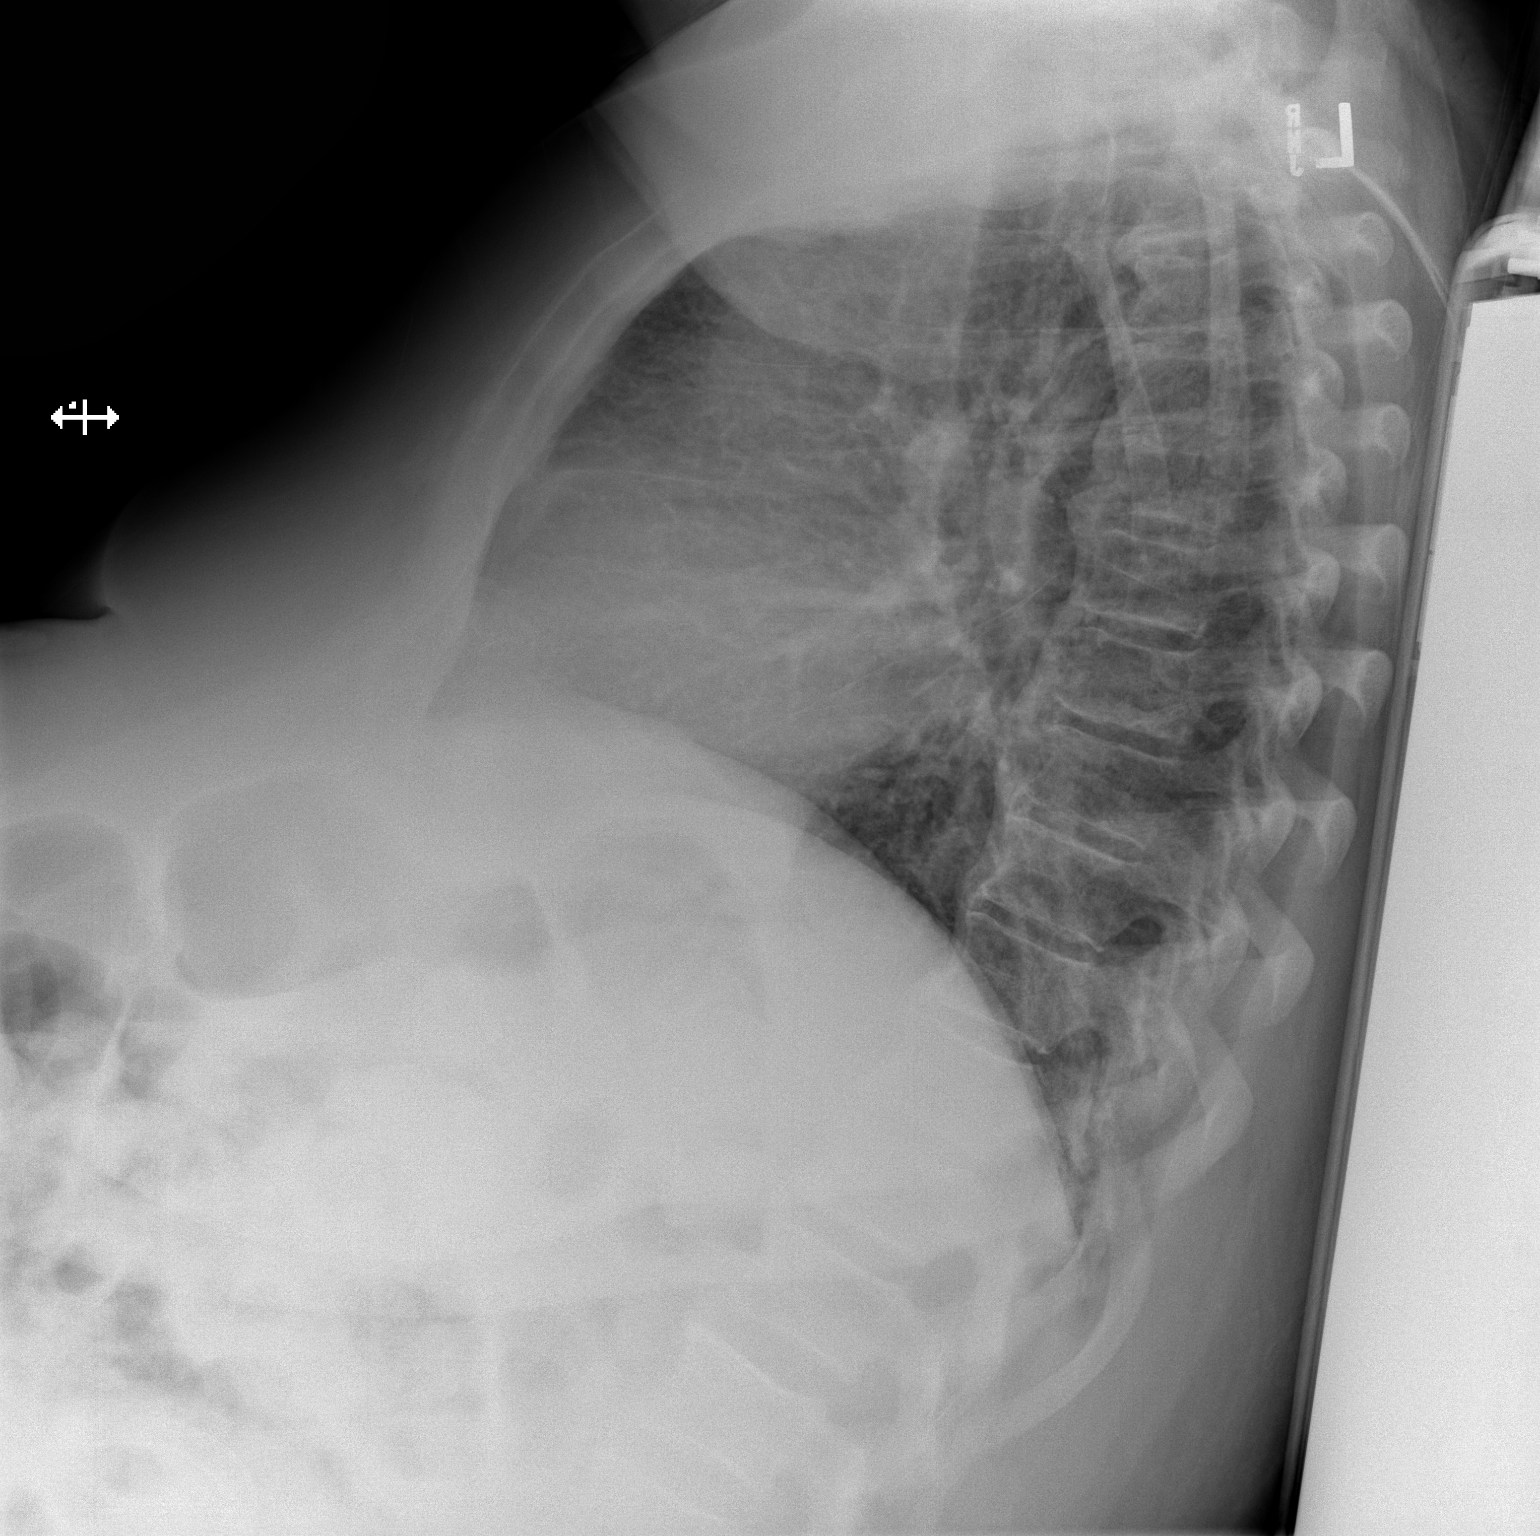

[x chest ap]
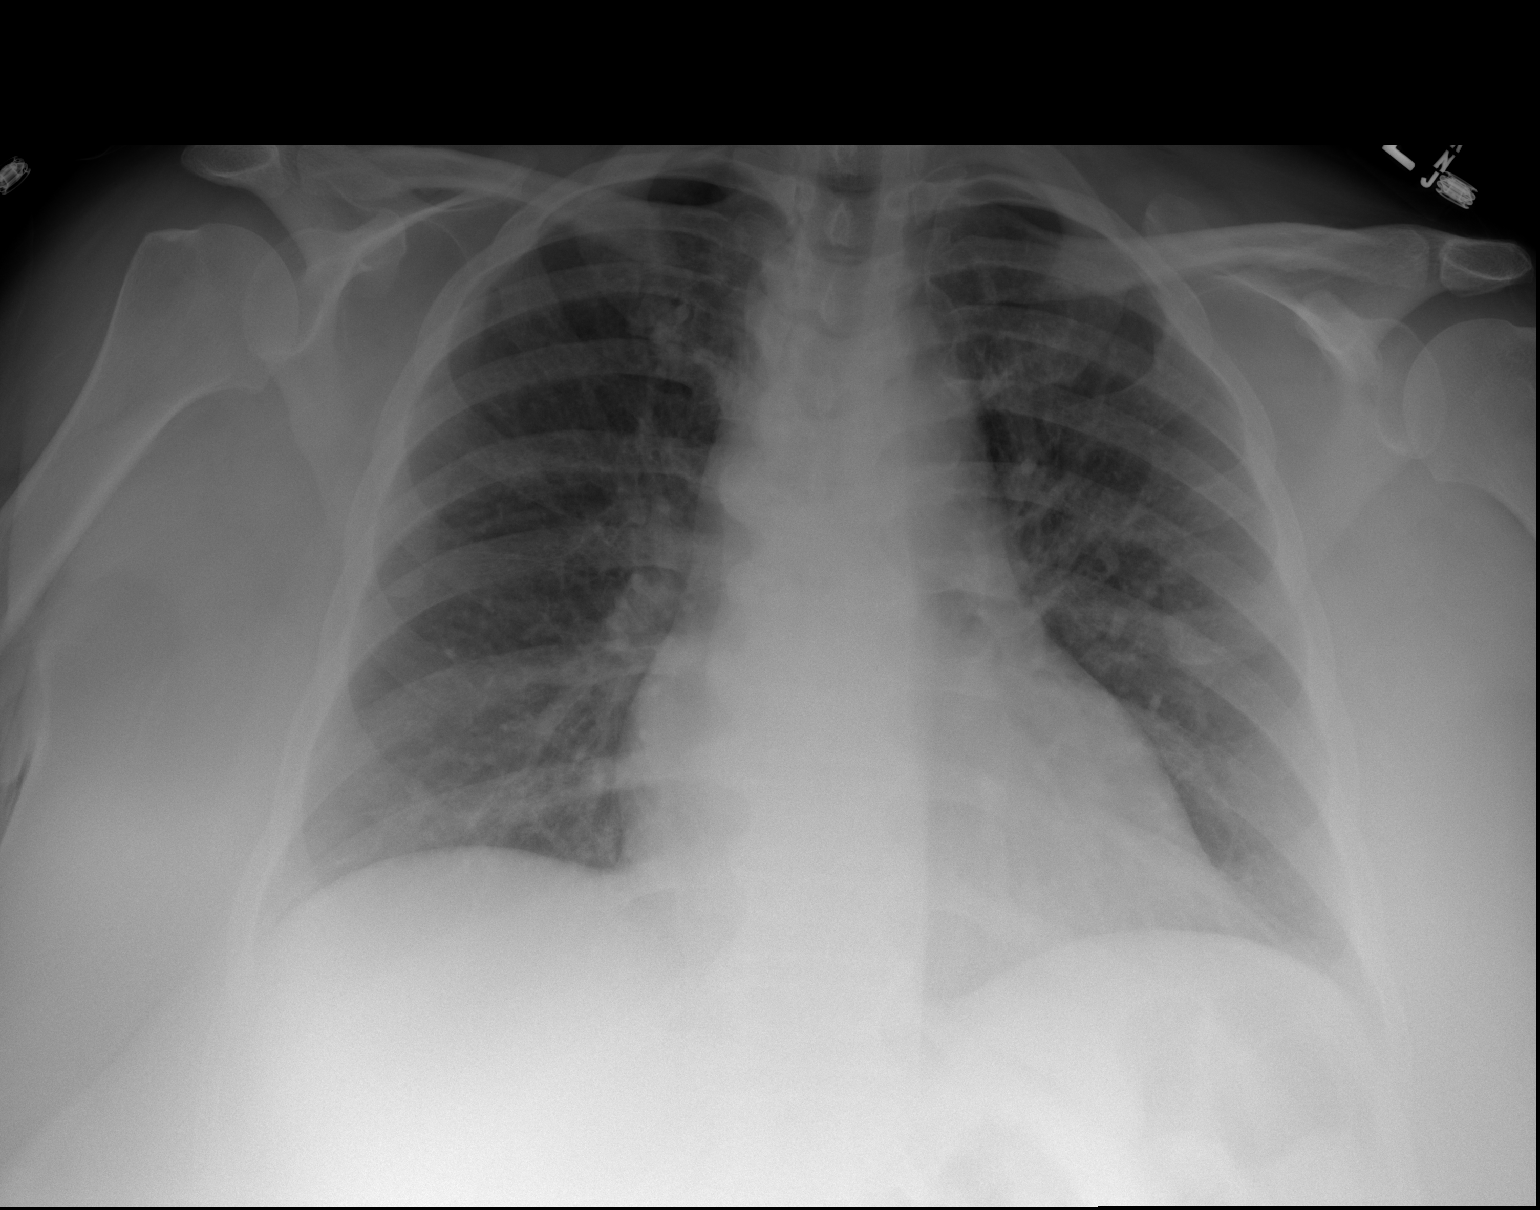

[2 of 2 positions shown; findings below may reference images not displayed]

FINDINGS: Upper normal heart size.

Mediastinal contours and pulmonary vascularity normal.

Lungs clear.

No infiltrate, pleural effusion or pneumothorax.

Scattered endplate spur formation thoracic spine.
IMPRESSION: No acute abnormalities.

## 2018-12-02 MED ORDER — TRIAMTERENE-HCTZ 37.5-25 MG PO TABS
1.0000 | ORAL_TABLET | Freq: Every day | ORAL | Status: DC
  Administered 2018-12-02: 1 via ORAL
  Filled 2018-12-02: qty 1

## 2018-12-02 MED ORDER — UMECLIDINIUM-VILANTEROL 62.5-25 MCG/INH IN AEPB
1.0000 | INHALATION_SPRAY | Freq: Every day | RESPIRATORY_TRACT | Status: DC
  Filled 2018-12-02: qty 14

## 2018-12-02 MED ORDER — AMLODIPINE BESYLATE 5 MG PO TABS
10.0000 mg | ORAL_TABLET | Freq: Every day | ORAL | Status: DC
  Administered 2018-12-02: 10 mg via ORAL
  Filled 2018-12-02: qty 2

## 2018-12-02 MED ORDER — ALBUTEROL SULFATE HFA 108 (90 BASE) MCG/ACT IN AERS: 2.0000 | INHALATION_SPRAY | Freq: Four times a day (QID) | RESPIRATORY_TRACT | Status: DC | PRN

## 2018-12-02 MED ORDER — DOXEPIN HCL 25 MG PO CAPS: 25.0000 mg | ORAL_CAPSULE | Freq: Every day | ORAL | Status: DC

## 2018-12-02 MED ORDER — SERTRALINE HCL 50 MG PO TABS
50.0000 mg | ORAL_TABLET | Freq: Every day | ORAL | Status: DC
  Administered 2018-12-02: 50 mg via ORAL
  Filled 2018-12-02: qty 1

## 2018-12-02 MED ORDER — INSULIN NPH (HUMAN) (ISOPHANE) 100 UNIT/ML ~~LOC~~ SUSP
130.0000 [IU] | Freq: Every day | SUBCUTANEOUS | Status: DC
  Filled 2018-12-02: qty 10

## 2018-12-02 MED ORDER — ENALAPRIL MALEATE 10 MG PO TABS
20.0000 mg | ORAL_TABLET | Freq: Every day | ORAL | Status: DC
  Administered 2018-12-02: 20 mg via ORAL
  Filled 2018-12-02: qty 2

## 2018-12-02 MED ORDER — METFORMIN HCL 500 MG PO TABS
1000.0000 mg | ORAL_TABLET | Freq: Two times a day (BID) | ORAL | Status: DC
  Administered 2018-12-02: 1000 mg via ORAL
  Filled 2018-12-02: qty 2

## 2018-12-02 MED ORDER — NICOTINE 21 MG/24HR TD PT24
21.0000 mg | MEDICATED_PATCH | Freq: Every day | TRANSDERMAL | Status: DC
  Filled 2018-12-02: qty 1

## 2018-12-02 MED ORDER — GABAPENTIN 300 MG PO CAPS
600.0000 mg | ORAL_CAPSULE | Freq: Four times a day (QID) | ORAL | Status: DC
  Administered 2018-12-02 (×2): 600 mg via ORAL
  Filled 2018-12-02 (×2): qty 2

## 2018-12-02 MED ORDER — POTASSIUM CHLORIDE CRYS ER 20 MEQ PO TBCR
20.0000 meq | EXTENDED_RELEASE_TABLET | Freq: Every day | ORAL | Status: DC
  Administered 2018-12-02: 20 meq via ORAL
  Filled 2018-12-02: qty 1

## 2018-12-02 MED ORDER — BICTEGRAVIR-EMTRICITAB-TENOFOV 50-200-25 MG PO TABS
1.0000 | ORAL_TABLET | Freq: Every day | ORAL | Status: DC
  Filled 2018-12-02: qty 1

## 2018-12-02 MED ORDER — QUETIAPINE FUMARATE 100 MG PO TABS: 200.0000 mg | ORAL_TABLET | Freq: Every day | ORAL | Status: DC

## 2018-12-02 MED ORDER — ROSUVASTATIN CALCIUM 20 MG PO TABS
20.0000 mg | ORAL_TABLET | Freq: Every day | ORAL | Status: DC
  Filled 2018-12-02: qty 1

## 2018-12-02 MED ORDER — BICTEGRAVIR-EMTRICITAB-TENOFOV 50-200-25 MG PO TABS: 1.0000 | ORAL_TABLET | Freq: Every day | ORAL | 0 refills | Status: DC

## 2018-12-02 MED ORDER — INSULIN NPH (HUMAN) (ISOPHANE) 100 UNIT/ML ~~LOC~~ SUSP
60.0000 [IU] | Freq: Every day | SUBCUTANEOUS | Status: DC
  Administered 2018-12-02: 60 [IU] via SUBCUTANEOUS
  Filled 2018-12-02: qty 10

## 2018-12-02 MED ORDER — CLONIDINE HCL 0.1 MG PO TABS
0.2000 mg | ORAL_TABLET | Freq: Two times a day (BID) | ORAL | Status: DC
  Administered 2018-12-02 (×2): 0.2 mg via ORAL
  Filled 2018-12-02 (×2): qty 2

## 2018-12-02 MED FILL — BIKTARVY 50-200-25 MG TABS: 50-200-25 | 30 days supply | Qty: 30 | Fill #0

## 2018-12-02 NOTE — ED Notes (Addendum)
Lindon Romp, NP recommends inpatient treatment. AC, in review. TTS to secure placement. Lovena Le, RN, informed of disposition.

## 2018-12-02 NOTE — ED Notes (Signed)
Insulin NPH dosage was questioned by patient and this RN. This RN contacted MD Ralene Bathe for verification of dosage. Patient stated they take 60 units at home. MD Ralene Bathe told this RN to change insulin NPH to 60 units.

## 2018-12-02 NOTE — ED Notes (Addendum)
PER O[place]ie [phone] OR [phone]. SPOKE WITH JONATHAN. PT DOES NOT HAVE HIV MEDICATIONS WITH HER AND DOES NOT HAVE ANYONE WHO CAN BRING MEDS TO HER. PT HAS NOT DISPLAYED ANY AGGRESSIVE BEHAVIOR. PT DENIES EXPOSURE TO A COVID PT AND DENIES SYMPTOMS AT THIS TIME.  JONATHAN INFORMED TO CALL TOM THOMAS WITH DECISION. HE VERBALIZED THIS UNDERSTANDING.   NGOZI RN MADE AWARE OF PT CURRENT STATUS.

## 2018-12-02 NOTE — ED Notes (Signed)
Report has been given to [employer].

## 2018-12-02 NOTE — Telephone Encounter (Signed)
Is she still having any low sugar readings? We cannot arrange meals on wheels, maybe Sharee Pimple can help with this.

## 2018-12-02 NOTE — ED Notes (Signed)
Patient was given lunch tray. 

## 2018-12-02 NOTE — ED Notes (Signed)
Patient states she's having thoughts of harming herself. Patient denies having plan or following through with any plans of harming herself. Patient appears to have depressed affect. Patient stated that her mood was okay.

## 2018-12-02 NOTE — ED Notes (Signed)
[facility] has been contacted ETA is 40.

## 2018-12-02 NOTE — TOC Initial Note (Addendum)
Transition of Care [facility]) - Initial/Assessment Note    Patient Details  Name: Susan [name] MRN: [id number] Date of Birth: Sep 10, 1960  [facility] CM/SW Contact:    Erenest Rasher, RN Phone Number: 12/02/2018, 1:15 PM  Clinical Narrative:                 1:00 pm NCM spoke to pt and permission given to pick up her medication from pharmacy. States she gets meds from [facility]. Her dtr is her emergency contact gave permission to speak to dtr. Contacted [facility] and they do have [place] in stock. [place] to see if they have [place] in [place]. They do have in stock and can have ready in one hour. Faxed Facesheet to [employer] with insurance information. NCM followed up with Tonette Bihari to make aware.   Late entry 2:45 NCM picked up med from [facility]. Her Biktarvy was a $0 copay. Delivered medication to Tonette Bihari to be sent with her to [facility].   Expected Discharge Plan: Psychiatric Hospital Barriers to Discharge: Other (comment)(getting Airline pilot )   Patient Goals and CMS Choice        Expected Discharge Plan and Services Expected Discharge Plan: [facility] In-house Referral: NA Discharge Planning Services: CM Consult Post Acute Care Choice: NA Living arrangements for the past 2 months: Single Family Home                 DME Arranged: N/A DME Agency: NA HH Arranged: NA    Prior Living Arrangements/Services Living arrangements for the past 2 months: Single Family Home Lives with:: Adult Children              Current home services: (cane)    Activities of Daily Living   ADL Screening (condition at time of admission) Patient's cognitive ability adequate to safely complete daily activities?: Yes Patient able to express need for assistance with ADLs?: Yes Independently performs ADLs?: Yes (appropriate for developmental age)  Permission Sought/Granted   Permission granted to share  information with : Yes, Verbal Permission Granted              Emotional Assessment   Attitude/Demeanor/Rapport: Engaged Affect (typically observed): Accepting Orientation: : Oriented to Self, Oriented to Place, Oriented to  Time, Oriented to Situation   Psych Involvement: Yes (comment)  Admission diagnosis:  psych eval Patient Active Problem List   Diagnosis Date Noted  . Bronchitis due to tobacco use 07/21/2018  . Need for prophylactic vaccination against Streptococcus pneumoniae (pneumococcus) 05/20/2018  . Right hip pain 02/21/2018  . Routine general medical examination at a health care facility 08/23/2017  . Weakness 06/24/2017  . Osteoarthritis 07/14/2016  . Abnormal hemoglobin ([place]) 04/18/2016  . Hot flashes 10/16/2015  . PTSD (post-traumatic stress disorder) 05/12/2015  . MDD (major depressive disorder), recurrent episode, moderate ([place]) 05/11/2015  . Cannabis use disorder, severe, dependence ([place]) 05/11/2015  . Tobacco use disorder 05/11/2015  . Diabetes mellitus type 2 without retinopathy ([place]) 01/28/2015  . Screening examination for venereal disease 06/02/2014  . Hereditary and idiopathic peripheral neuropathy 06/03/2013  . Dysfunctional uterine bleeding 06/04/2012  . Dyslipidemia   . GERD (gastroesophageal reflux disease)   . Morbid obesity ([place]) 04/26/2011  . HIV disease ([place]) 03/28/2011  . Hypertension 03/28/2011   PCP:  Hoyt Koch, MD Pharmacy:   [facility], [address] [place] [address] Phone:  [phone] Fax: [phone]  [facility], [place] [address] Phone: [phone] Fax: [phone]     Social Determinants of Health (SDOH) Interventions    Readmission Risk Interventions No flowsheet data found.

## 2018-12-02 NOTE — Telephone Encounter (Signed)
Patient currently in the ED and Dr. Sharlet Salina is aware

## 2018-12-02 NOTE — BH Assessment (Addendum)
[facility] Assessment Progress Note  Per Buford Dresser, DO, this pt requires psychiatric hospitalization at this time.  At 13:44 Roderic Palau calls from [facility].  He reports that they would like to accept pt to their facility, but because her HIV medication is not on their formulary, she would need to have it in hand upon arrival.  This Probation officer spoke to Jonnie Finner, Nurse Case Manager about this, and she agrees to procure medications for pt.  She will deliver them to me to take to pt's nurse.  I then called back to [place].  Pt has been accepted to their facility by Dr Dareen Piano.  She is to be taken to the [facility].  Sheran Fava, PMHNP, concurs with this disposition, as does the pt who is currently under voluntary status.  Pt's nurse has been notified, and agrees to call report to [phone].  Pt is to be transported via Stacey Drain, [place] Coordinator [phone]   Addendum:  At 14:55 this Probation officer received pt's HIV medication from [place], and I took them to pt's nurse.  I informed her that pt is now ready for transfer to [facility].  Jalene Mullet, [place] Coordinator [phone]
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
## 2018-12-02 NOTE — Telephone Encounter (Signed)
Patient is currently in the ED for suicidal ideations will follow up with patient once patient is out of the hospital

## 2018-12-02 NOTE — ED Notes (Signed)
Patient transported to X-ray 

## 2018-12-12 ENCOUNTER — Telehealth: Payer: Self-pay | Admitting: Internal Medicine

## 2018-12-12 MED ORDER — ROSUVASTATIN CALCIUM 20 MG PO TABS: 20.0000 mg | ORAL_TABLET | Freq: Every day | ORAL | 0 refills | Status: DC

## 2018-12-12 MED ORDER — INSULIN ISOPHANE HUMAN 100 UNIT/ML KWIKPEN: 130.0000 [IU] | PEN_INJECTOR | SUBCUTANEOUS | 0 refills | Status: DC

## 2018-12-12 MED ORDER — OMEPRAZOLE 20 MG PO CPDR: 20.0000 mg | DELAYED_RELEASE_CAPSULE | Freq: Every day | ORAL | 0 refills | Status: DC

## 2018-12-12 MED ORDER — ALBUTEROL SULFATE HFA 108 (90 BASE) MCG/ACT IN AERS: 2.0000 | INHALATION_SPRAY | Freq: Four times a day (QID) | RESPIRATORY_TRACT | 2 refills | Status: DC | PRN

## 2018-12-12 MED ORDER — UMECLIDINIUM-VILANTEROL 62.5-25 MCG/INH IN AEPB: 1.0000 | INHALATION_SPRAY | Freq: Every day | RESPIRATORY_TRACT | 2 refills | Status: DC

## 2018-12-12 NOTE — Telephone Encounter (Signed)
Caryl Pina from [facility] in [place] called and said that the patients medication was transferred from [place], but several prescriptions didn't get sent over.  Ashley's call back number is [phone]

## 2018-12-12 NOTE — Telephone Encounter (Signed)
Spoke with Caryl Pina and she has talked with Dr Zigmund Daniel- he is taking care of the Rx.

## 2018-12-12 NOTE — Telephone Encounter (Signed)
Received call from after hours call service regarding medications.  Patient recently transferred pharmacies and needed new prescriptions sent over.  Completely out of medications.  Connected with [facility] and reviewed prescriptions needed including anoro, albuterol, crestor, omeprazole and humulin. Reviewed most recent office visits/labs as well.  Refills sent x90 days.

## 2018-12-12 NOTE — Telephone Encounter (Signed)
Please refill NPH insulin x 1 Ov is due

## 2018-12-15 ENCOUNTER — Other Ambulatory Visit: Payer: Self-pay

## 2018-12-15 DIAGNOSIS — E119 Type 2 diabetes mellitus without complications: Secondary | ICD-10-CM

## 2018-12-15 MED ORDER — "PEN NEEDLES 5/16"" 30G X 8 MM MISC": 1.0000 | Freq: Every day | 2 refills | Status: DC

## 2018-12-15 MED ORDER — INSULIN ISOPHANE HUMAN 100 UNIT/ML KWIKPEN: 130.0000 [IU] | PEN_INJECTOR | SUBCUTANEOUS | 0 refills | Status: DC

## 2018-12-15 MED FILL — TRUEPLUS SYR 0.5ML 30GX5/16: 30G X 5/16" | 90 days supply | Qty: 100 | Fill #0

## 2018-12-15 MED FILL — HUMULIN N 100 UNITS/ML KWIK: 100 | 34 days supply | Qty: 45 | Fill #0

## 2018-12-15 NOTE — Telephone Encounter (Signed)
Insulin NPH, Human,, Isophane, (HUMULIN N KWIKPEN) 100 UNIT/ML Kiwkpen 50 mL 0 12/15/2018    Sig - Route: Inject 130 Units into the skin every morning. - Subcutaneous   Sent to pharmacy as: Insulin NPH, Human,, Isophane, (HUMULIN N KWIKPEN) 100 UNIT/ML Kiwkpen   E-Prescribing Status: Receipt confirmed by pharmacy (12/15/2018 10:53 AM EDT)

## 2018-12-17 ENCOUNTER — Telehealth: Payer: Self-pay

## 2018-12-17 ENCOUNTER — Other Ambulatory Visit: Payer: Self-pay | Admitting: Behavioral Health

## 2018-12-17 DIAGNOSIS — B20 Human immunodeficiency virus [HIV] disease: Secondary | ICD-10-CM

## 2018-12-17 MED ORDER — BICTEGRAVIR-EMTRICITAB-TENOFOV 50-200-25 MG PO TABS: 1.0000 | ORAL_TABLET | Freq: Every day | ORAL | 0 refills | Status: DC

## 2018-12-17 NOTE — Telephone Encounter (Signed)
Just received fax signed from provider today will be faxing today

## 2018-12-17 NOTE — Telephone Encounter (Signed)
Copied from [place] [id number]. Topic: General - Other >> Dec 17, 2018  1:38 PM Celene Kras A wrote: Reason for CRM: Theadora [name], from [facility], is calling regarding a medication she faxed over. She states she sent the fax on 12/11/2018. She is requesting to know when it will be returned so she can get the pt the medication she needs. Please advise.

## 2018-12-19 ENCOUNTER — Other Ambulatory Visit: Payer: Self-pay | Admitting: Internal Medicine

## 2018-12-19 DIAGNOSIS — B20 Human immunodeficiency virus [HIV] disease: Secondary | ICD-10-CM

## 2019-01-01 ENCOUNTER — Other Ambulatory Visit: Payer: Self-pay | Admitting: Internal Medicine

## 2019-02-10 ENCOUNTER — Ambulatory Visit: Payer: Medicare Other | Admitting: Endocrinology

## 2019-02-16 ENCOUNTER — Other Ambulatory Visit: Payer: Medicare Other

## 2019-02-16 ENCOUNTER — Emergency Department (HOSPITAL_COMMUNITY): Payer: Medicare Other

## 2019-02-16 ENCOUNTER — Other Ambulatory Visit: Payer: Self-pay

## 2019-02-16 ENCOUNTER — Inpatient Hospital Stay (HOSPITAL_COMMUNITY)
Admission: EM | Admit: 2019-02-16 | Discharge: 2019-02-19 | DRG: 871 | Disposition: A | Discharge status: Home / Self Care | Payer: Medicare Other | Attending: Internal Medicine | Admitting: Internal Medicine

## 2019-02-16 DIAGNOSIS — Z20828 Contact with and (suspected) exposure to other viral communicable diseases: Secondary | ICD-10-CM | POA: Diagnosis present

## 2019-02-16 DIAGNOSIS — J45901 Unspecified asthma with (acute) exacerbation: Secondary | ICD-10-CM | POA: Diagnosis not present

## 2019-02-16 DIAGNOSIS — J9622 Acute and chronic respiratory failure with hypercapnia: Secondary | ICD-10-CM | POA: Diagnosis not present

## 2019-02-16 DIAGNOSIS — F17210 Nicotine dependence, cigarettes, uncomplicated: Secondary | ICD-10-CM | POA: Diagnosis present

## 2019-02-16 DIAGNOSIS — F250 Schizoaffective disorder, bipolar type: Secondary | ICD-10-CM | POA: Diagnosis present

## 2019-02-16 DIAGNOSIS — J189 Pneumonia, unspecified organism: Secondary | ICD-10-CM | POA: Diagnosis present

## 2019-02-16 DIAGNOSIS — F419 Anxiety disorder, unspecified: Secondary | ICD-10-CM | POA: Diagnosis present

## 2019-02-16 DIAGNOSIS — J9601 Acute respiratory failure with hypoxia: Secondary | ICD-10-CM | POA: Diagnosis not present

## 2019-02-16 DIAGNOSIS — F209 Schizophrenia, unspecified: Secondary | ICD-10-CM

## 2019-02-16 DIAGNOSIS — E860 Dehydration: Secondary | ICD-10-CM | POA: Diagnosis not present

## 2019-02-16 DIAGNOSIS — E785 Hyperlipidemia, unspecified: Secondary | ICD-10-CM | POA: Diagnosis not present

## 2019-02-16 DIAGNOSIS — Z79899 Other long term (current) drug therapy: Secondary | ICD-10-CM

## 2019-02-16 DIAGNOSIS — Z781 Physical restraint status: Secondary | ICD-10-CM | POA: Diagnosis not present

## 2019-02-16 DIAGNOSIS — Z7951 Long term (current) use of inhaled steroids: Secondary | ICD-10-CM

## 2019-02-16 DIAGNOSIS — R000 Tachycardia, unspecified: Secondary | ICD-10-CM | POA: Diagnosis not present

## 2019-02-16 DIAGNOSIS — Z21 Asymptomatic human immunodeficiency virus [HIV] infection status: Secondary | ICD-10-CM | POA: Diagnosis present

## 2019-02-16 DIAGNOSIS — I10 Essential (primary) hypertension: Secondary | ICD-10-CM | POA: Diagnosis present

## 2019-02-16 DIAGNOSIS — Z6841 Body Mass Index (BMI) 40.0 and over, adult: Secondary | ICD-10-CM

## 2019-02-16 DIAGNOSIS — N179 Acute kidney failure, unspecified: Secondary | ICD-10-CM | POA: Diagnosis present

## 2019-02-16 DIAGNOSIS — J9621 Acute and chronic respiratory failure with hypoxia: Secondary | ICD-10-CM

## 2019-02-16 DIAGNOSIS — Z814 Family history of other substance abuse and dependence: Secondary | ICD-10-CM

## 2019-02-16 DIAGNOSIS — R918 Other nonspecific abnormal finding of lung field: Secondary | ICD-10-CM | POA: Diagnosis not present

## 2019-02-16 DIAGNOSIS — K219 Gastro-esophageal reflux disease without esophagitis: Secondary | ICD-10-CM | POA: Diagnosis not present

## 2019-02-16 DIAGNOSIS — R0603 Acute respiratory distress: Secondary | ICD-10-CM | POA: Diagnosis not present

## 2019-02-16 DIAGNOSIS — G20 Parkinson's disease: Secondary | ICD-10-CM | POA: Diagnosis not present

## 2019-02-16 DIAGNOSIS — A419 Sepsis, unspecified organism: Principal | ICD-10-CM | POA: Diagnosis present

## 2019-02-16 DIAGNOSIS — M170 Bilateral primary osteoarthritis of knee: Secondary | ICD-10-CM | POA: Diagnosis present

## 2019-02-16 DIAGNOSIS — R410 Disorientation, unspecified: Secondary | ICD-10-CM | POA: Diagnosis not present

## 2019-02-16 DIAGNOSIS — Z915 Personal history of self-harm: Secondary | ICD-10-CM

## 2019-02-16 DIAGNOSIS — D473 Essential (hemorrhagic) thrombocythemia: Secondary | ICD-10-CM | POA: Diagnosis present

## 2019-02-16 DIAGNOSIS — E1110 Type 2 diabetes mellitus with ketoacidosis without coma: Secondary | ICD-10-CM | POA: Diagnosis not present

## 2019-02-16 DIAGNOSIS — J9610 Chronic respiratory failure, unspecified whether with hypoxia or hypercapnia: Secondary | ICD-10-CM | POA: Diagnosis present

## 2019-02-16 DIAGNOSIS — Z9851 Tubal ligation status: Secondary | ICD-10-CM

## 2019-02-16 DIAGNOSIS — R0689 Other abnormalities of breathing: Secondary | ICD-10-CM | POA: Diagnosis not present

## 2019-02-16 DIAGNOSIS — E1165 Type 2 diabetes mellitus with hyperglycemia: Secondary | ICD-10-CM | POA: Diagnosis not present

## 2019-02-16 DIAGNOSIS — Z794 Long term (current) use of insulin: Secondary | ICD-10-CM

## 2019-02-16 DIAGNOSIS — Z888 Allergy status to other drugs, medicaments and biological substances status: Secondary | ICD-10-CM

## 2019-02-16 DIAGNOSIS — R069 Unspecified abnormalities of breathing: Secondary | ICD-10-CM | POA: Diagnosis not present

## 2019-02-16 DIAGNOSIS — F600 Paranoid personality disorder: Secondary | ICD-10-CM | POA: Diagnosis not present

## 2019-02-16 DIAGNOSIS — F331 Major depressive disorder, recurrent, moderate: Secondary | ICD-10-CM | POA: Diagnosis present

## 2019-02-16 DIAGNOSIS — Z8673 Personal history of transient ischemic attack (TIA), and cerebral infarction without residual deficits: Secondary | ICD-10-CM

## 2019-02-16 DIAGNOSIS — E1310 Other specified diabetes mellitus with ketoacidosis without coma: Secondary | ICD-10-CM

## 2019-02-16 DIAGNOSIS — Z818 Family history of other mental and behavioral disorders: Secondary | ICD-10-CM

## 2019-02-16 LAB — CBC WITH DIFFERENTIAL/PLATELET
Abs Immature Granulocytes: 0.5 10*3/uL — ABNORMAL HIGH (ref 0.00–0.07)
Basophils Absolute: 0.1 10*3/uL (ref 0.0–0.1)
Basophils Relative: 1 %
Eosinophils Absolute: 0.1 10*3/uL (ref 0.0–0.5)
Eosinophils Relative: 1 %
HCT: 46.1 % — ABNORMAL HIGH (ref 36.0–46.0)
Hemoglobin: 15.2 g/dL — ABNORMAL HIGH (ref 12.0–15.0)
Immature Granulocytes: 4 %
Lymphocytes Relative: 14 %
Lymphs Abs: 1.9 10*3/uL (ref 0.7–4.0)
MCH: 28.6 pg (ref 26.0–34.0)
MCHC: 33 g/dL (ref 30.0–36.0)
MCV: 86.8 fL (ref 80.0–100.0)
Monocytes Absolute: 1.2 10*3/uL — ABNORMAL HIGH (ref 0.1–1.0)
Monocytes Relative: 8 %
Neutro Abs: 9.9 10*3/uL — ABNORMAL HIGH (ref 1.7–7.7)
Neutrophils Relative %: 72 %
Platelets: 513 10*3/uL — ABNORMAL HIGH (ref 150–400)
RBC: 5.31 MIL/uL — ABNORMAL HIGH (ref 3.87–5.11)
RDW: 14.7 % (ref 11.5–15.5)
WBC: 13.6 10*3/uL — ABNORMAL HIGH (ref 4.0–10.5)
nRBC: 0 % (ref 0.0–0.2)

## 2019-02-16 LAB — BASIC METABOLIC PANEL
Anion gap: 19 — ABNORMAL HIGH (ref 5–15)
BUN: 42 mg/dL — ABNORMAL HIGH (ref 6–20)
CO2: 21 mmol/L — ABNORMAL LOW (ref 22–32)
Calcium: 8.5 mg/dL — ABNORMAL LOW (ref 8.9–10.3)
Chloride: 89 mmol/L — ABNORMAL LOW (ref 98–111)
Creatinine, Ser: 1.42 mg/dL — ABNORMAL HIGH (ref 0.44–1.00)
GFR calc Af Amer: 48 mL/min — ABNORMAL LOW (ref >60)
GFR calc non Af Amer: 41 mL/min — ABNORMAL LOW (ref >60)
Glucose, Bld: 536 mg/dL (ref 70–99)
Potassium: 3.6 mmol/L (ref 3.5–5.1)
Sodium: 129 mmol/L — ABNORMAL LOW (ref 135–145)

## 2019-02-16 LAB — POCT I-STAT 7, (LYTES, BLD GAS, ICA,H+H)
Acid-base deficit: 1 mmol/L (ref 0.0–2.0)
Bicarbonate: 27.1 mmol/L (ref 20.0–28.0)
Calcium, Ion: 1.11 mmol/L — ABNORMAL LOW (ref 1.15–1.40)
HCT: 49 % — ABNORMAL HIGH (ref 36.0–46.0)
Hemoglobin: 16.7 g/dL — ABNORMAL HIGH (ref 12.0–15.0)
O2 Saturation: 98 %
Patient temperature: 99.1
Potassium: 3.5 mmol/L (ref 3.5–5.1)
Sodium: 129 mmol/L — ABNORMAL LOW (ref 135–145)
TCO2: 29 mmol/L (ref 22–32)
pCO2 arterial: 55 mmHg — ABNORMAL HIGH (ref 32.0–48.0)
pH, Arterial: 7.302 — ABNORMAL LOW (ref 7.350–7.450)
pO2, Arterial: 117 mmHg — ABNORMAL HIGH (ref 83.0–108.0)

## 2019-02-16 LAB — CBG MONITORING, ED
Glucose-Capillary: 486 mg/dL — ABNORMAL HIGH (ref 70–99)
Glucose-Capillary: 529 mg/dL (ref 70–99)
Glucose-Capillary: 533 mg/dL (ref 70–99)

## 2019-02-16 LAB — BRAIN NATRIURETIC PEPTIDE: B Natriuretic Peptide: 103.8 pg/mL — ABNORMAL HIGH (ref 0.0–100.0)

## 2019-02-16 LAB — SARS CORONAVIRUS 2: SARS Coronavirus 2: NOT DETECTED

## 2019-02-16 IMAGING — CR PORTABLE CHEST - 1 VIEW
1 series · 1 of 1 positions shown · non-contrast
Comparison: [DATE]

CLINICAL DATA: Dyspnea and wheezing.

EXAM:
PORTABLE CHEST 1 VIEW

[AP]
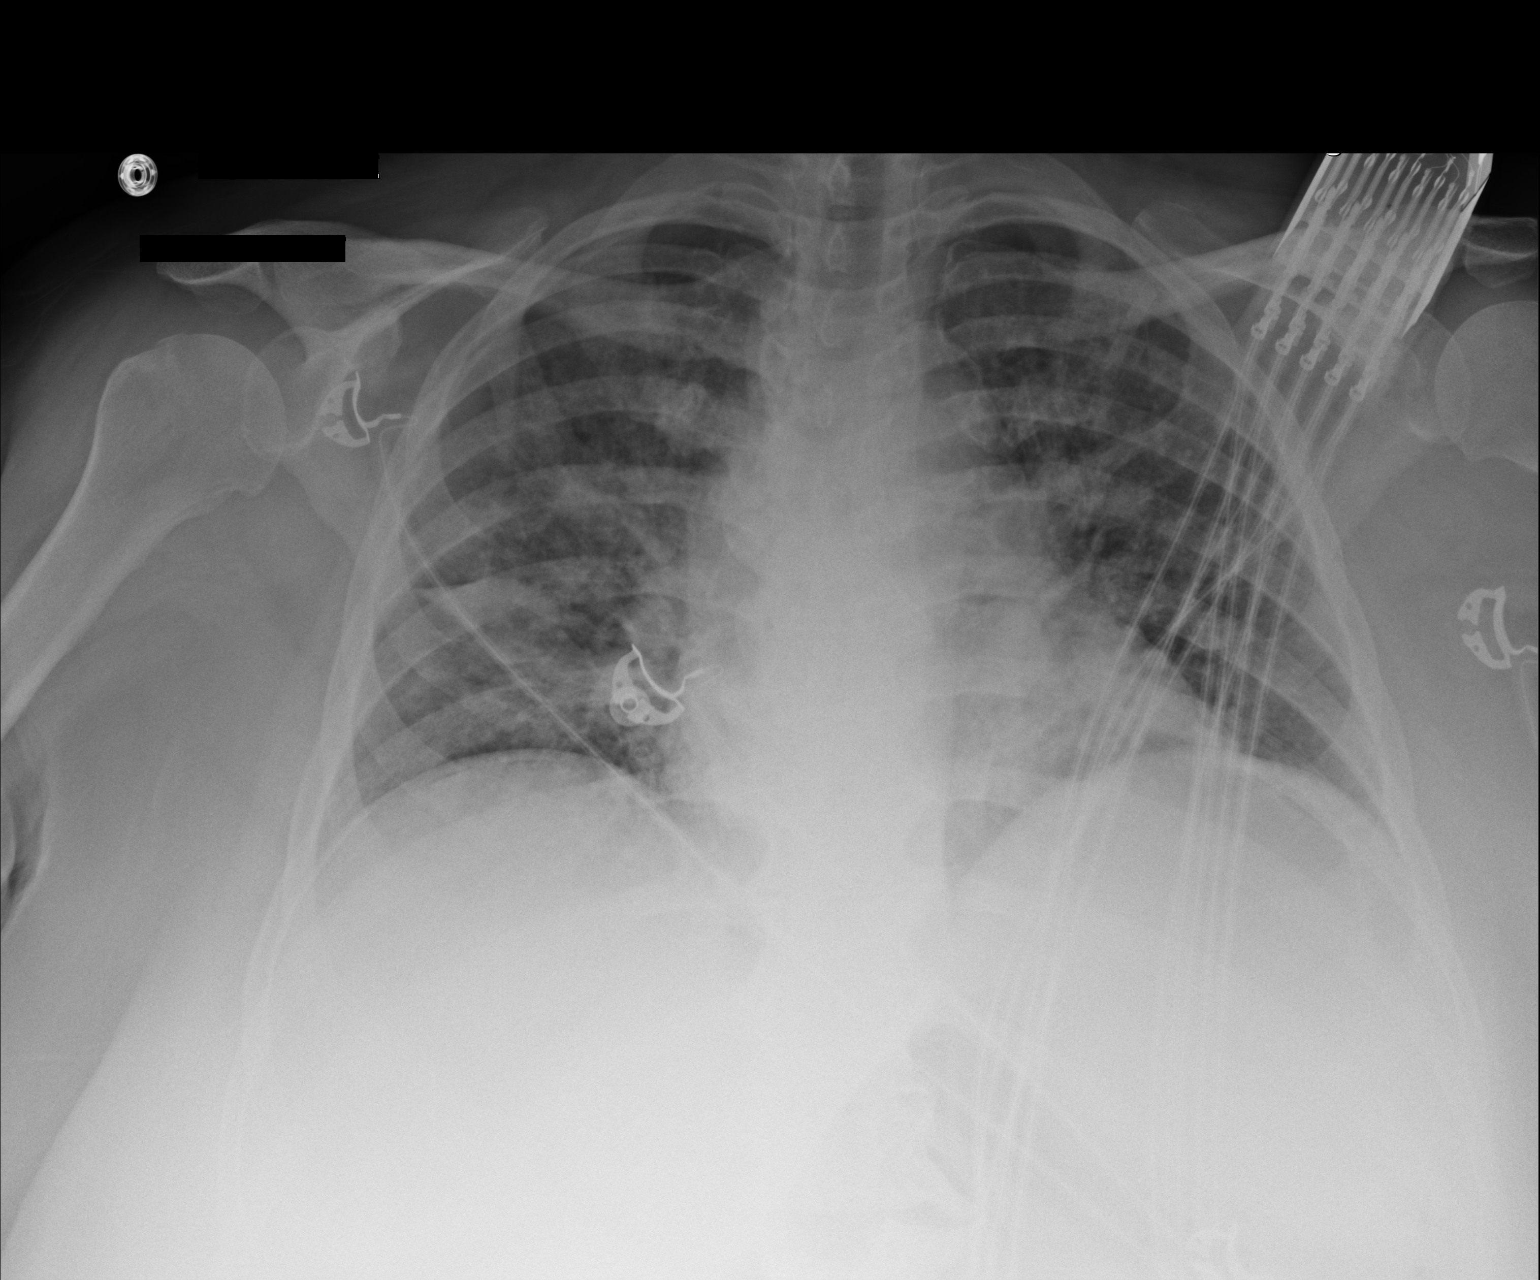

[1 of 1 positions shown; findings below may reference images not displayed]

FINDINGS: The heart size is enlarged. There are diffuse bilateral hazy
airspace opacities. No pneumothorax. No large pleural effusion.
There is no acute osseous abnormality.
IMPRESSION: Diffuse bilateral hazy airspace opacities which can be seen in
patients with pulmonary edema or an atypical infectious process such
as viral pneumonia.

## 2019-02-16 MED ORDER — SODIUM CHLORIDE 0.9 % IV SOLN
500.0000 mg | Freq: Once | INTRAVENOUS | Status: AC
  Administered 2019-02-17: 500 mg via INTRAVENOUS
  Filled 2019-02-16 (×2): qty 500

## 2019-02-16 MED ORDER — SODIUM CHLORIDE 0.9 % IV SOLN
INTRAVENOUS | Status: DC
  Administered 2019-02-17: via INTRAVENOUS

## 2019-02-16 MED ORDER — ENOXAPARIN SODIUM 40 MG/0.4ML ~~LOC~~ SOLN
40.0000 mg | SUBCUTANEOUS | Status: DC
  Administered 2019-02-17 – 2019-02-19 (×2): 40 mg via SUBCUTANEOUS
  Filled 2019-02-16 (×4): qty 0.4

## 2019-02-16 MED ORDER — IPRATROPIUM BROMIDE 0.02 % IN SOLN
0.5000 mg | Freq: Once | RESPIRATORY_TRACT | Status: AC
  Administered 2019-02-16: 0.5 mg via RESPIRATORY_TRACT
  Filled 2019-02-16: qty 2.5

## 2019-02-16 MED ORDER — ALBUTEROL SULFATE HFA 108 (90 BASE) MCG/ACT IN AERS
INHALATION_SPRAY | RESPIRATORY_TRACT | Status: AC
  Filled 2019-02-16: qty 6.7

## 2019-02-16 MED ORDER — DEXTROSE-NACL 5-0.45 % IV SOLN
INTRAVENOUS | Status: DC
  Administered 2019-02-16: 21:00:00 via INTRAVENOUS

## 2019-02-16 MED ORDER — SODIUM CHLORIDE 0.9 % IV SOLN
500.0000 mg | Freq: Once | INTRAVENOUS | Status: DC
  Administered 2019-02-16: 500 mg via INTRAVENOUS
  Filled 2019-02-16: qty 500

## 2019-02-16 MED ORDER — METHYLPREDNISOLONE SODIUM SUCC 125 MG IJ SOLR
80.0000 mg | Freq: Three times a day (TID) | INTRAMUSCULAR | Status: DC
  Administered 2019-02-17: 80 mg via INTRAVENOUS
  Filled 2019-02-16: qty 2

## 2019-02-16 MED ORDER — SODIUM CHLORIDE 0.9 % IV SOLN: INTRAVENOUS | Status: DC

## 2019-02-16 MED ORDER — SODIUM CHLORIDE 0.9 % IV SOLN
1.0000 g | Freq: Once | INTRAVENOUS | Status: AC
  Administered 2019-02-16: 1 g via INTRAVENOUS
  Filled 2019-02-16: qty 10

## 2019-02-16 MED ORDER — SODIUM CHLORIDE 0.9 % IV BOLUS
1000.0000 mL | Freq: Once | INTRAVENOUS | Status: AC
  Administered 2019-02-16: 1000 mL via INTRAVENOUS

## 2019-02-16 MED ORDER — INSULIN REGULAR(HUMAN) IN NACL 100-0.9 UT/100ML-% IV SOLN
INTRAVENOUS | Status: DC
  Administered 2019-02-17: 10 [IU]/h via INTRAVENOUS
  Filled 2019-02-16 (×2): qty 100

## 2019-02-16 MED ORDER — IPRATROPIUM-ALBUTEROL 0.5-2.5 (3) MG/3ML IN SOLN
3.0000 mL | Freq: Four times a day (QID) | RESPIRATORY_TRACT | Status: DC
  Administered 2019-02-17: 3 mL via RESPIRATORY_TRACT
  Filled 2019-02-16: qty 3

## 2019-02-16 MED ORDER — INSULIN REGULAR(HUMAN) IN NACL 100-0.9 UT/100ML-% IV SOLN
INTRAVENOUS | Status: DC
  Administered 2019-02-16: 4.7 [IU]/h via INTRAVENOUS
  Filled 2019-02-16: qty 100

## 2019-02-16 MED ORDER — ALBUTEROL SULFATE (2.5 MG/3ML) 0.083% IN NEBU: 2.5000 mg | INHALATION_SOLUTION | RESPIRATORY_TRACT | Status: DC | PRN

## 2019-02-16 MED ORDER — ALBUTEROL SULFATE HFA 108 (90 BASE) MCG/ACT IN AERS
6.0000 | INHALATION_SPRAY | Freq: Once | RESPIRATORY_TRACT | Status: AC
  Administered 2019-02-16: 6 via RESPIRATORY_TRACT

## 2019-02-16 MED ORDER — ACETAMINOPHEN 325 MG PO TABS
650.0000 mg | ORAL_TABLET | Freq: Four times a day (QID) | ORAL | Status: DC | PRN
  Administered 2019-02-18: 650 mg via ORAL
  Filled 2019-02-16: qty 2

## 2019-02-16 MED ORDER — DEXTROSE-NACL 5-0.45 % IV SOLN: INTRAVENOUS | Status: DC

## 2019-02-16 MED ORDER — ALBUTEROL (5 MG/ML) CONTINUOUS INHALATION SOLN
15.0000 mg/h | INHALATION_SOLUTION | RESPIRATORY_TRACT | Status: DC
  Administered 2019-02-16: 15 mg/h via RESPIRATORY_TRACT
  Filled 2019-02-16: qty 20

## 2019-02-16 MED ORDER — POTASSIUM CHLORIDE 10 MEQ/100ML IV SOLN
10.0000 meq | INTRAVENOUS | Status: DC
  Administered 2019-02-16: 10 meq via INTRAVENOUS
  Filled 2019-02-16: qty 100

## 2019-02-16 NOTE — ED Triage Notes (Signed)
Pt from home via EMS with resp distress. Upon arrival to pt home, EMS reports Pt O2 sat was 81%. EMS placed pt on Cpap, sats went to 94-98. PTA pt was given 125 solumedrol, .5 atrovent, and 10 of Albuterol. VS BP 180/100, HR117. CBG 464,
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
## 2019-02-16 NOTE — ED Notes (Signed)
Precaution signs hung
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
## 2019-02-17 ENCOUNTER — Inpatient Hospital Stay (HOSPITAL_COMMUNITY): Payer: Medicare Other

## 2019-02-17 DIAGNOSIS — N179 Acute kidney failure, unspecified: Secondary | ICD-10-CM

## 2019-02-17 DIAGNOSIS — J45901 Unspecified asthma with (acute) exacerbation: Secondary | ICD-10-CM

## 2019-02-17 DIAGNOSIS — J189 Pneumonia, unspecified organism: Secondary | ICD-10-CM

## 2019-02-17 DIAGNOSIS — E1110 Type 2 diabetes mellitus with ketoacidosis without coma: Secondary | ICD-10-CM

## 2019-02-17 LAB — RESPIRATORY PANEL BY PCR

## 2019-02-17 LAB — BASIC METABOLIC PANEL
Anion gap: 22 — ABNORMAL HIGH (ref 5–15)
Anion gap: 12 (ref 5–15)
Anion gap: 11 (ref 5–15)
Anion gap: 14 (ref 5–15)
BUN: 42 mg/dL — ABNORMAL HIGH (ref 6–20)
BUN: 40 mg/dL — ABNORMAL HIGH (ref 6–20)
BUN: 35 mg/dL — ABNORMAL HIGH (ref 6–20)
BUN: 36 mg/dL — ABNORMAL HIGH (ref 6–20)
CO2: 18 mmol/L — ABNORMAL LOW (ref 22–32)
CO2: 25 mmol/L (ref 22–32)
CO2: 26 mmol/L (ref 22–32)
CO2: 21 mmol/L — ABNORMAL LOW (ref 22–32)
Calcium: 8.1 mg/dL — ABNORMAL LOW (ref 8.9–10.3)
Calcium: 8.1 mg/dL — ABNORMAL LOW (ref 8.9–10.3)
Calcium: 8.2 mg/dL — ABNORMAL LOW (ref 8.9–10.3)
Calcium: 7.9 mg/dL — ABNORMAL LOW (ref 8.9–10.3)
Chloride: 91 mmol/L — ABNORMAL LOW (ref 98–111)
Chloride: 96 mmol/L — ABNORMAL LOW (ref 98–111)
Chloride: 98 mmol/L (ref 98–111)
Chloride: 95 mmol/L — ABNORMAL LOW (ref 98–111)
Creatinine, Ser: 1.5 mg/dL — ABNORMAL HIGH (ref 0.44–1.00)
Creatinine, Ser: 1.17 mg/dL — ABNORMAL HIGH (ref 0.44–1.00)
Creatinine, Ser: 0.99 mg/dL (ref 0.44–1.00)
Creatinine, Ser: 0.95 mg/dL (ref 0.44–1.00)
GFR calc Af Amer: 45 mL/min — ABNORMAL LOW (ref >60)
GFR calc Af Amer: >60 mL/min (ref >60)
GFR calc Af Amer: >60 mL/min (ref >60)
GFR calc Af Amer: >60 mL/min (ref >60)
GFR calc non Af Amer: 39 mL/min — ABNORMAL LOW (ref >60)
GFR calc non Af Amer: 52 mL/min — ABNORMAL LOW (ref >60)
GFR calc non Af Amer: >60 mL/min (ref >60)
GFR calc non Af Amer: >60 mL/min (ref >60)
Glucose, Bld: 532 mg/dL (ref 70–99)
Glucose, Bld: 303 mg/dL — ABNORMAL HIGH (ref 70–99)
Glucose, Bld: 189 mg/dL — ABNORMAL HIGH (ref 70–99)
Glucose, Bld: 322 mg/dL — ABNORMAL HIGH (ref 70–99)
Potassium: 4.3 mmol/L (ref 3.5–5.1)
Potassium: 3.3 mmol/L — ABNORMAL LOW (ref 3.5–5.1)
Potassium: 3.3 mmol/L — ABNORMAL LOW (ref 3.5–5.1)
Potassium: 4.3 mmol/L (ref 3.5–5.1)
Sodium: 131 mmol/L — ABNORMAL LOW (ref 135–145)
Sodium: 133 mmol/L — ABNORMAL LOW (ref 135–145)
Sodium: 135 mmol/L (ref 135–145)
Sodium: 130 mmol/L — ABNORMAL LOW (ref 135–145)

## 2019-02-17 LAB — URINALYSIS, ROUTINE W REFLEX MICROSCOPIC
Bacteria, UA: NONE SEEN
Bilirubin Urine: NEGATIVE
Glucose, UA: >=500 mg/dL — AB
Hgb urine dipstick: NEGATIVE
Ketones, ur: 5 mg/dL — AB
Leukocytes,Ua: NEGATIVE
Nitrite: NEGATIVE
Protein, ur: 30 mg/dL — AB
Specific Gravity, Urine: 1.026 (ref 1.005–1.030)
pH: 5 (ref 5.0–8.0)

## 2019-02-17 LAB — D-DIMER, QUANTITATIVE: D-Dimer, Quant: 6.33 ug/mL-FEU — ABNORMAL HIGH (ref 0.00–0.50)

## 2019-02-17 LAB — CBC WITH DIFFERENTIAL/PLATELET
Abs Immature Granulocytes: 0.53 10*3/uL — ABNORMAL HIGH (ref 0.00–0.07)
Basophils Absolute: 0 10*3/uL (ref 0.0–0.1)
Basophils Relative: 0 %
Eosinophils Absolute: 0 10*3/uL (ref 0.0–0.5)
Eosinophils Relative: 0 %
HCT: 42.6 % (ref 36.0–46.0)
Hemoglobin: 14 g/dL (ref 12.0–15.0)
Immature Granulocytes: 4 %
Lymphocytes Relative: 11 %
Lymphs Abs: 1.4 10*3/uL (ref 0.7–4.0)
MCH: 27.9 pg (ref 26.0–34.0)
MCHC: 32.9 g/dL (ref 30.0–36.0)
MCV: 84.9 fL (ref 80.0–100.0)
Monocytes Absolute: 0.8 10*3/uL (ref 0.1–1.0)
Monocytes Relative: 6 %
Neutro Abs: 9.9 10*3/uL — ABNORMAL HIGH (ref 1.7–7.7)
Neutrophils Relative %: 79 %
Platelets: 526 10*3/uL — ABNORMAL HIGH (ref 150–400)
RBC: 5.02 MIL/uL (ref 3.87–5.11)
RDW: 14.6 % (ref 11.5–15.5)
WBC Morphology: INCREASED
WBC: 12.6 10*3/uL — ABNORMAL HIGH (ref 4.0–10.5)
nRBC: 0 % (ref 0.0–0.2)

## 2019-02-17 LAB — GLUCOSE, CAPILLARY
Glucose-Capillary: 197 mg/dL — ABNORMAL HIGH (ref 70–99)
Glucose-Capillary: 186 mg/dL — ABNORMAL HIGH (ref 70–99)
Glucose-Capillary: 171 mg/dL — ABNORMAL HIGH (ref 70–99)
Glucose-Capillary: 177 mg/dL — ABNORMAL HIGH (ref 70–99)
Glucose-Capillary: 130 mg/dL — ABNORMAL HIGH (ref 70–99)
Glucose-Capillary: 125 mg/dL — ABNORMAL HIGH (ref 70–99)
Glucose-Capillary: 283 mg/dL — ABNORMAL HIGH (ref 70–99)
Glucose-Capillary: 354 mg/dL — ABNORMAL HIGH (ref 70–99)

## 2019-02-17 LAB — FIBRINOGEN: Fibrinogen: 783 mg/dL — ABNORMAL HIGH (ref 210–475)

## 2019-02-17 LAB — PROCALCITONIN: Procalcitonin: 1.64 ng/mL

## 2019-02-17 LAB — CBG MONITORING, ED
Glucose-Capillary: 259 mg/dL — ABNORMAL HIGH (ref 70–99)
Glucose-Capillary: 307 mg/dL — ABNORMAL HIGH (ref 70–99)
Glucose-Capillary: 439 mg/dL — ABNORMAL HIGH (ref 70–99)

## 2019-02-17 LAB — MAGNESIUM: Magnesium: 2.9 mg/dL — ABNORMAL HIGH (ref 1.7–2.4)

## 2019-02-17 LAB — LACTATE DEHYDROGENASE: LDH: 340 U/L — ABNORMAL HIGH (ref 98–192)

## 2019-02-17 LAB — TROPONIN I: Troponin I: <0.03 ng/mL (ref <0.03)

## 2019-02-17 LAB — SARS CORONAVIRUS 2: SARS Coronavirus 2: NOT DETECTED

## 2019-02-17 LAB — MRSA PCR SCREENING: MRSA by PCR: NEGATIVE

## 2019-02-17 LAB — FERRITIN: Ferritin: 602 ng/mL — ABNORMAL HIGH (ref 11–307)

## 2019-02-17 LAB — HEMOGLOBIN A1C
Hgb A1c MFr Bld: 9.5 % — ABNORMAL HIGH (ref 4.8–5.6)
Mean Plasma Glucose: 225.95 mg/dL

## 2019-02-17 LAB — C-REACTIVE PROTEIN: CRP: 32.9 mg/dL — ABNORMAL HIGH (ref <1.0)

## 2019-02-17 IMAGING — CT CT ANGIOGRAPHY CHEST
2 of 10 series · 17 of 46 positions shown · IV contrast (omnipaque)
Comparison: One-view chest x-ray [DATE]

CLINICAL DATA: Respiratory distress.  Productive cough.

EXAM:
CT ANGIOGRAPHY CHEST WITH CONTRAST
TECHNIQUE: Multidetector CT imaging of the chest was performed using the
standard protocol during bolus administration of intravenous
contrast. Multiplanar CT image reconstructions and MIPs were
obtained to evaluate the vascular anatomy.
CONTRAST:  75mL OMNIPAQUE IOHEXOL 350 MG/ML SOLN

[Series 8: thins · axial · 0.91mm/px · z∈[+1136,+1326]mm · 14 of 212 slices shown]
[im 11/212  lung]
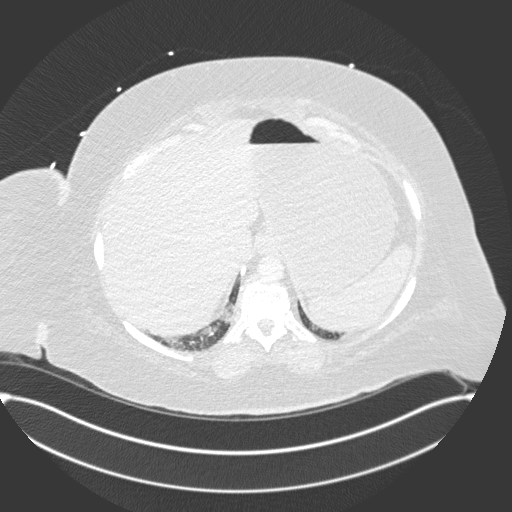
[im 31/212  soft-tissue]
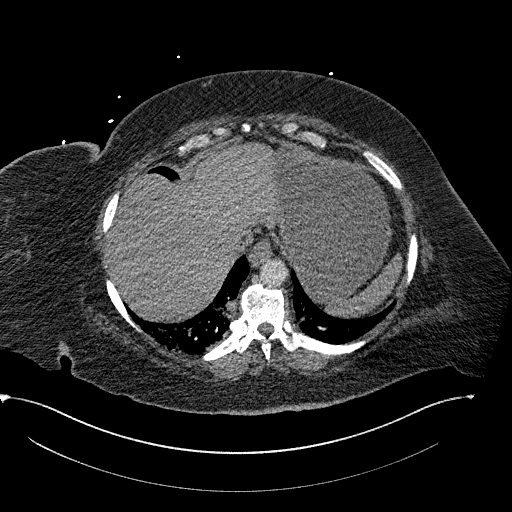
[im 41/212  lung]
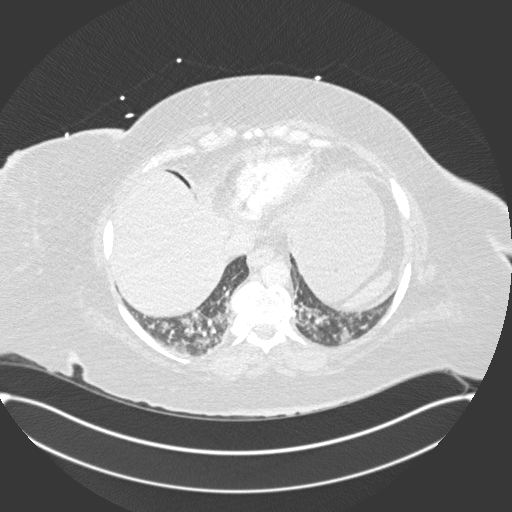
[im 61/212  soft-tissue]
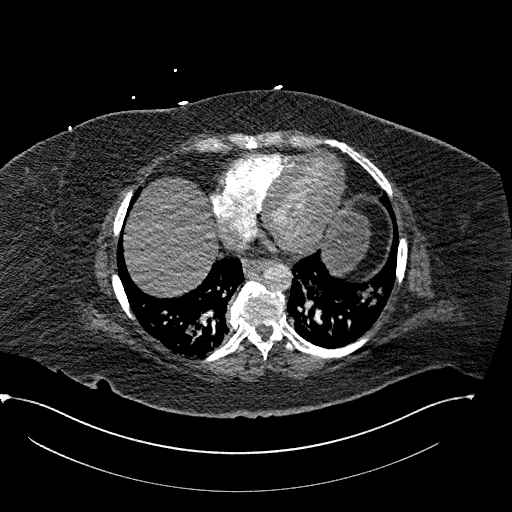
[im 71/212  lung]
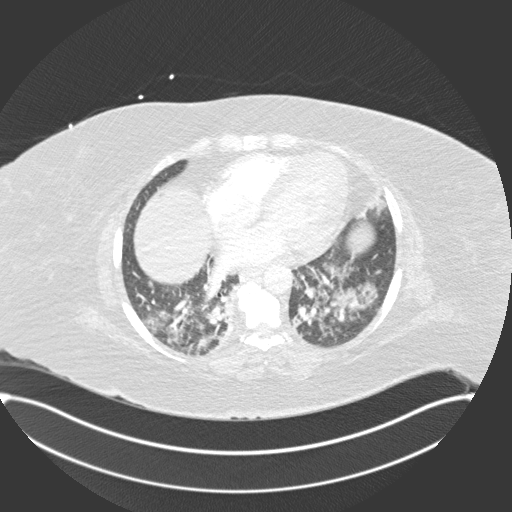
[im 81/212  soft-tissue]
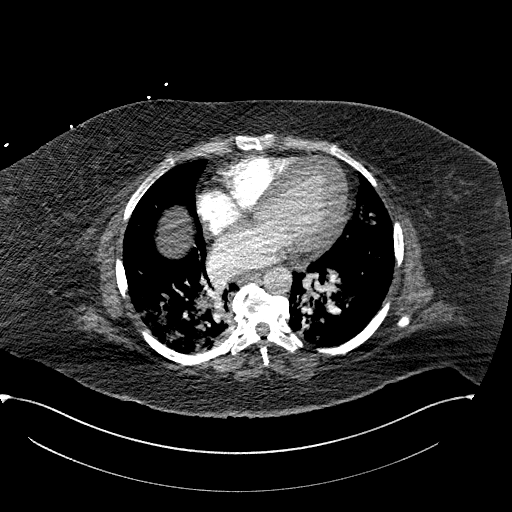
[im 101/212  lung]
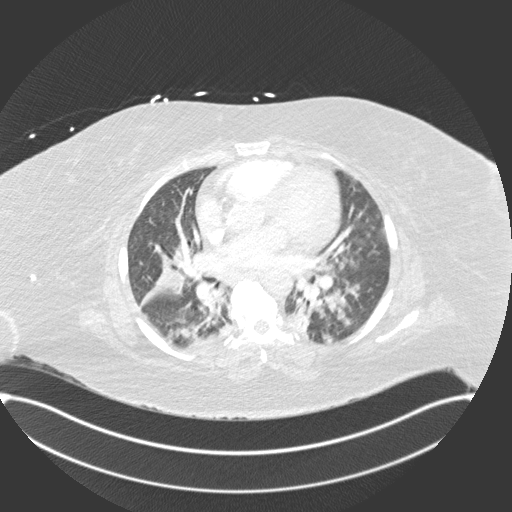
[im 111/212  soft-tissue]
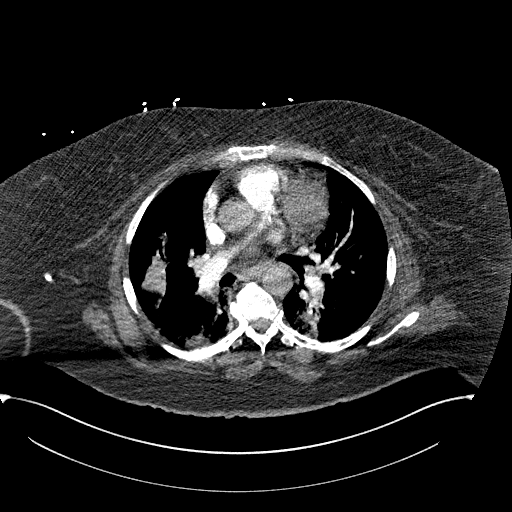
[im 131/212  lung]
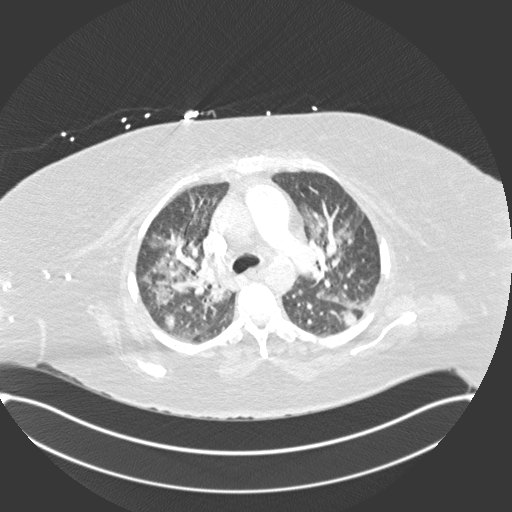
[im 141/212  soft-tissue]
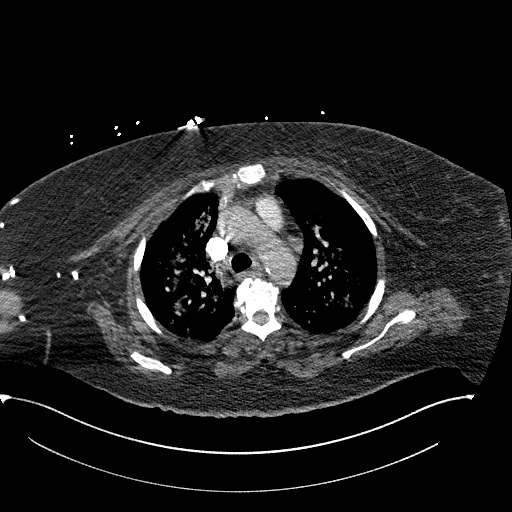
[im 161/212  lung]
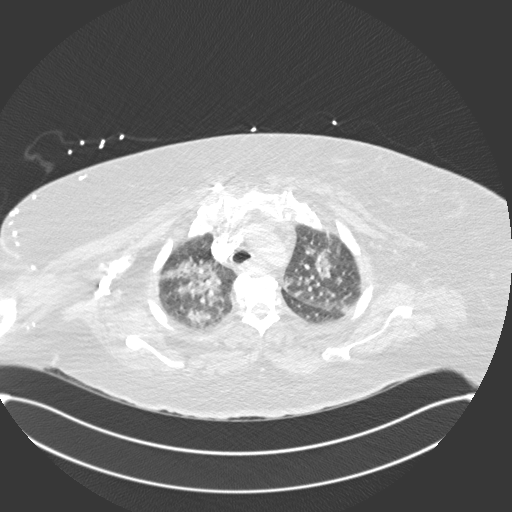
[im 171/212  soft-tissue]
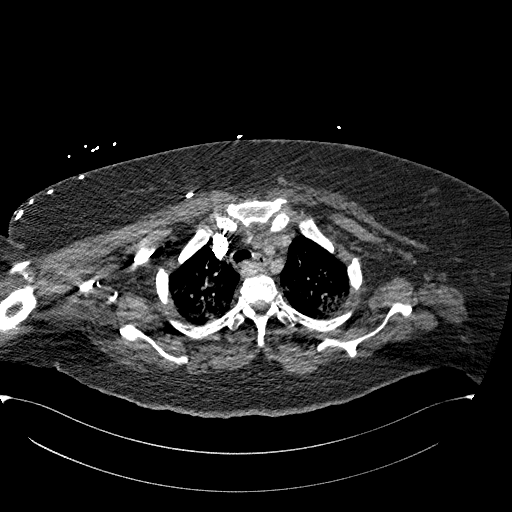
[im 181/212  lung]
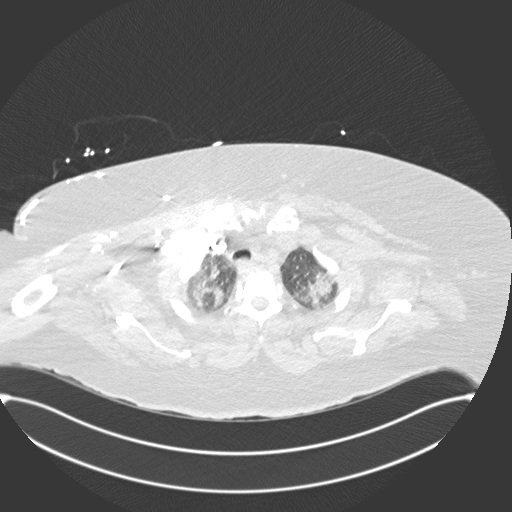
[im 201/212  soft-tissue]
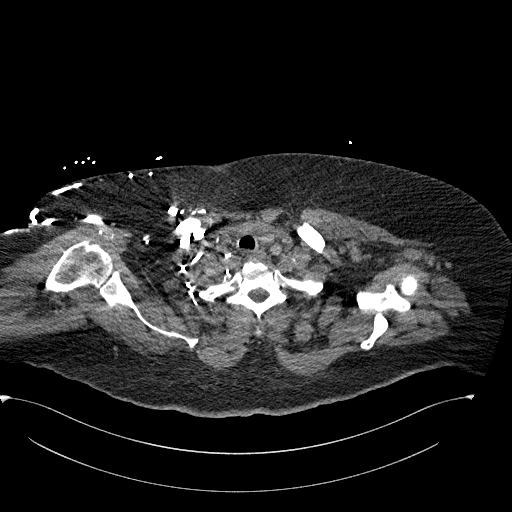

[Series 10: coronal mpr · coronal · 0.44mm/px · 3 of 145 slices shown]
[im 37/145  soft-tissue]
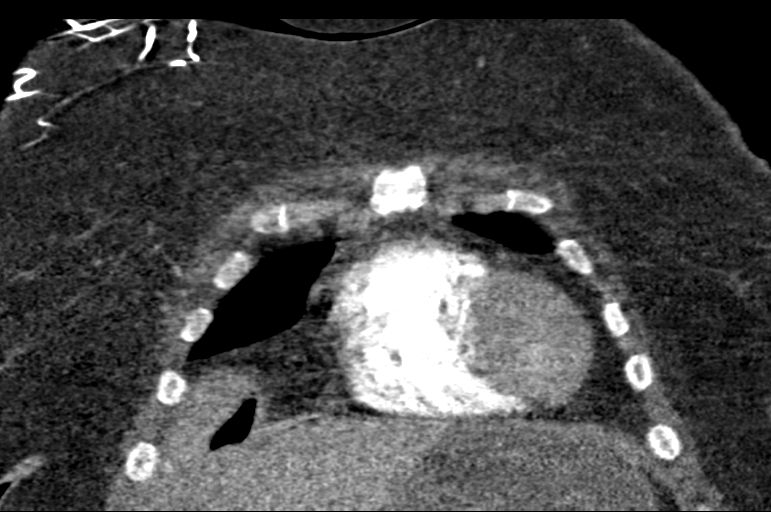
[im 73/145  soft-tissue]
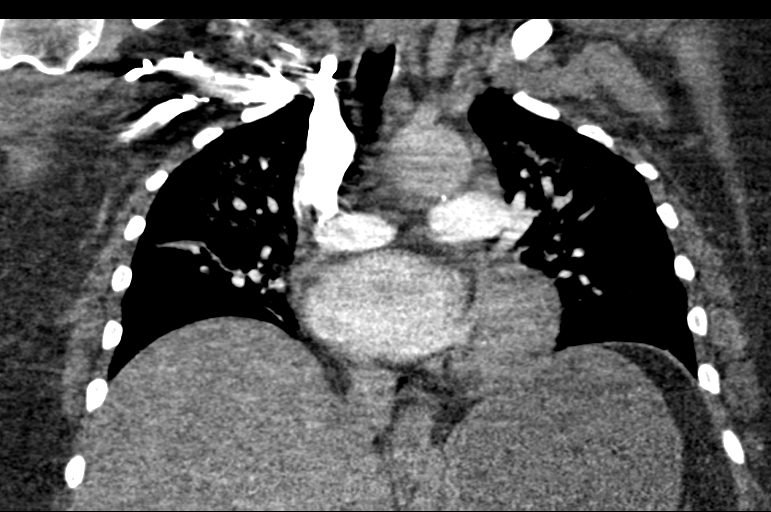
[im 109/145  soft-tissue]
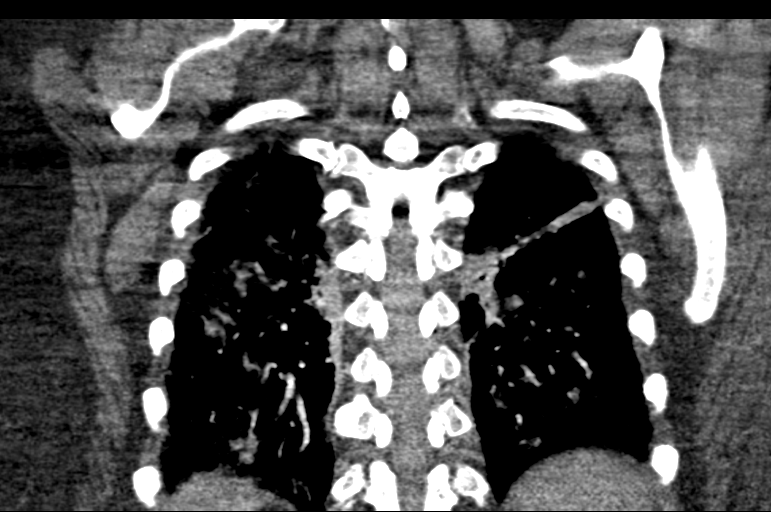

[17 of 46 positions shown; findings below may reference images not displayed]

FINDINGS: Cardiovascular: Heart size is normal. Coronary artery calcifications
are present. Atherosclerotic changes are noted at the aorta. Great
vessel origins are within normal limits. There is no aneurysm.
Pulmonary artery opacification is satisfactory. The study is
moderately degraded by patient motion. No significant filling defect
is present to suggest pulmonary embolus.

Mediastinum/Nodes: Subcentimeter prevascular and subcarinal nodes
are present. No significant hilar or axillary adenopathy is present.

Lungs/Pleura: Extensive patchy bilateral airspace disease is present
bilaterally. This involves the upper and lower lobes. Airways are
patent. There is no pneumothorax or significant pleural effusion. No
necrosis is present.

Upper Abdomen: Stomach is moderately distended. Limited imaging of
the abdomen is otherwise unremarkable.

Musculoskeletal: Fused anterior osteophytes in the thoracic spine
are consistent with DISH. Vertebral body heights alignment are
maintained. No focal lytic or blastic lesions are present. Ribs are
unremarkable.

Review of the MIP images confirms the above findings.
IMPRESSION: 1. Diffuse bilateral patchy airspace disease consistent with multi
lobar pneumonia.
2. No pulmonary embolus.
3.  Aortic Atherosclerosis ([LY]-[LY]).

## 2019-02-17 MED ORDER — BUSPIRONE HCL 10 MG PO TABS
15.0000 mg | ORAL_TABLET | Freq: Three times a day (TID) | ORAL | Status: DC
  Administered 2019-02-17 – 2019-02-19 (×7): 15 mg via ORAL
  Filled 2019-02-17 (×8): qty 2

## 2019-02-17 MED ORDER — SODIUM CHLORIDE 0.9 % IV SOLN
1.0000 g | INTRAVENOUS | Status: DC
  Administered 2019-02-18 – 2019-02-19 (×2): 1 g via INTRAVENOUS
  Filled 2019-02-17: qty 10
  Filled 2019-02-17: qty 1
  Filled 2019-02-17 (×2): qty 10

## 2019-02-17 MED ORDER — ALBUTEROL SULFATE HFA 108 (90 BASE) MCG/ACT IN AERS
4.0000 | INHALATION_SPRAY | RESPIRATORY_TRACT | Status: DC | PRN
  Administered 2019-02-17 – 2019-02-18 (×2): 4 via RESPIRATORY_TRACT

## 2019-02-17 MED ORDER — POTASSIUM CHLORIDE CRYS ER 20 MEQ PO TBCR
40.0000 meq | EXTENDED_RELEASE_TABLET | Freq: Once | ORAL | Status: AC
  Administered 2019-02-17: 40 meq via ORAL
  Filled 2019-02-17: qty 2

## 2019-02-17 MED ORDER — IPRATROPIUM-ALBUTEROL 20-100 MCG/ACT IN AERS: 2.0000 | INHALATION_SPRAY | Freq: Four times a day (QID) | RESPIRATORY_TRACT | Status: DC

## 2019-02-17 MED ORDER — IPRATROPIUM-ALBUTEROL 20-100 MCG/ACT IN AERS
2.0000 | INHALATION_SPRAY | Freq: Four times a day (QID) | RESPIRATORY_TRACT | Status: DC
  Administered 2019-02-17 (×3): 2 via RESPIRATORY_TRACT
  Filled 2019-02-17: qty 4

## 2019-02-17 MED ORDER — INSULIN GLARGINE 100 UNIT/ML ~~LOC~~ SOLN
10.0000 [IU] | Freq: Every day | SUBCUTANEOUS | Status: DC
  Administered 2019-02-17: 10 [IU] via SUBCUTANEOUS
  Filled 2019-02-17 (×3): qty 0.1

## 2019-02-17 MED ORDER — BENZTROPINE MESYLATE 1 MG PO TABS
0.5000 mg | ORAL_TABLET | Freq: Every day | ORAL | Status: DC
  Administered 2019-02-17 – 2019-02-19 (×3): 0.5 mg via ORAL
  Filled 2019-02-17 (×3): qty 1

## 2019-02-17 MED ORDER — ALBUTEROL SULFATE (2.5 MG/3ML) 0.083% IN NEBU: 2.5000 mg | INHALATION_SOLUTION | RESPIRATORY_TRACT | Status: DC | PRN

## 2019-02-17 MED ORDER — GABAPENTIN 300 MG PO CAPS
600.0000 mg | ORAL_CAPSULE | Freq: Three times a day (TID) | ORAL | Status: DC
  Administered 2019-02-17 – 2019-02-19 (×7): 600 mg via ORAL
  Filled 2019-02-17 (×7): qty 2

## 2019-02-17 MED ORDER — IPRATROPIUM-ALBUTEROL 0.5-2.5 (3) MG/3ML IN SOLN: 3.0000 mL | Freq: Four times a day (QID) | RESPIRATORY_TRACT | Status: DC

## 2019-02-17 MED ORDER — QUETIAPINE FUMARATE 25 MG PO TABS
200.0000 mg | ORAL_TABLET | Freq: Every day | ORAL | Status: DC
  Administered 2019-02-17 – 2019-02-18 (×2): 200 mg via ORAL
  Filled 2019-02-17: qty 1
  Filled 2019-02-17: qty 8

## 2019-02-17 MED ORDER — QUETIAPINE FUMARATE 400 MG PO TABS
400.0000 mg | ORAL_TABLET | Freq: Every day | ORAL | Status: DC
  Administered 2019-02-18: 400 mg via ORAL
  Filled 2019-02-17 (×3): qty 1

## 2019-02-17 MED ORDER — ROSUVASTATIN CALCIUM 20 MG PO TABS
20.0000 mg | ORAL_TABLET | Freq: Every day | ORAL | Status: DC
  Administered 2019-02-17 – 2019-02-19 (×3): 20 mg via ORAL
  Filled 2019-02-17 (×3): qty 1

## 2019-02-17 MED ORDER — CLONIDINE HCL 0.2 MG PO TABS
0.2000 mg | ORAL_TABLET | Freq: Two times a day (BID) | ORAL | Status: DC
  Administered 2019-02-17 – 2019-02-19 (×5): 0.2 mg via ORAL
  Filled 2019-02-17 (×5): qty 1

## 2019-02-17 MED ORDER — BICTEGRAVIR-EMTRICITAB-TENOFOV 50-200-25 MG PO TABS
1.0000 | ORAL_TABLET | Freq: Every day | ORAL | Status: DC
  Administered 2019-02-17 – 2019-02-19 (×3): 1 via ORAL
  Filled 2019-02-17 (×3): qty 1

## 2019-02-17 MED ORDER — METHYLPREDNISOLONE SODIUM SUCC 125 MG IJ SOLR
60.0000 mg | Freq: Three times a day (TID) | INTRAMUSCULAR | Status: DC
  Administered 2019-02-17 – 2019-02-19 (×6): 60 mg via INTRAVENOUS
  Filled 2019-02-17 (×8): qty 2

## 2019-02-17 MED ORDER — IPRATROPIUM-ALBUTEROL 0.5-2.5 (3) MG/3ML IN SOLN: 3.0000 mL | RESPIRATORY_TRACT | Status: DC | PRN

## 2019-02-17 MED ORDER — PANTOPRAZOLE SODIUM 20 MG PO TBEC
20.0000 mg | DELAYED_RELEASE_TABLET | Freq: Every day | ORAL | Status: DC
  Administered 2019-02-17 – 2019-02-19 (×3): 20 mg via ORAL
  Filled 2019-02-17 (×3): qty 1

## 2019-02-17 MED ORDER — GABAPENTIN 300 MG PO CAPS: 600.0000 mg | ORAL_CAPSULE | Freq: Four times a day (QID) | ORAL | Status: DC

## 2019-02-17 MED ORDER — DOXEPIN HCL 50 MG PO CAPS
50.0000 mg | ORAL_CAPSULE | Freq: Every day | ORAL | Status: DC
  Administered 2019-02-18 (×2): 50 mg via ORAL
  Filled 2019-02-17 (×4): qty 1

## 2019-02-17 MED ORDER — INSULIN ASPART 100 UNIT/ML ~~LOC~~ SOLN
0.0000 [IU] | Freq: Three times a day (TID) | SUBCUTANEOUS | Status: DC
  Administered 2019-02-17 – 2019-02-19 (×5): 5 [IU] via SUBCUTANEOUS

## 2019-02-17 MED ORDER — ALBUTEROL SULFATE HFA 108 (90 BASE) MCG/ACT IN AERS: 4.0000 | INHALATION_SPRAY | RESPIRATORY_TRACT | Status: DC | PRN

## 2019-02-17 MED ORDER — IOHEXOL 350 MG/ML SOLN
75.0000 mL | Freq: Once | INTRAVENOUS | Status: AC | PRN
  Administered 2019-02-17: 75 mL via INTRAVENOUS

## 2019-02-17 MED ORDER — INSULIN ASPART 100 UNIT/ML ~~LOC~~ SOLN
0.0000 [IU] | Freq: Every day | SUBCUTANEOUS | Status: DC
  Administered 2019-02-17: 5 [IU] via SUBCUTANEOUS
  Administered 2019-02-18: 4 [IU] via SUBCUTANEOUS

## 2019-02-17 MED ORDER — SODIUM CHLORIDE 0.9 % IV SOLN
500.0000 mg | INTRAVENOUS | Status: DC
  Administered 2019-02-18 – 2019-02-19 (×2): 500 mg via INTRAVENOUS
  Filled 2019-02-17 (×4): qty 500

## 2019-02-17 MED ORDER — DM-GUAIFENESIN ER 30-600 MG PO TB12
2.0000 | ORAL_TABLET | Freq: Two times a day (BID) | ORAL | Status: DC
  Administered 2019-02-17 – 2019-02-19 (×4): 2 via ORAL
  Filled 2019-02-17 (×5): qty 2

## 2019-02-17 MED ORDER — SERTRALINE HCL 50 MG PO TABS
50.0000 mg | ORAL_TABLET | Freq: Every day | ORAL | Status: DC
  Administered 2019-02-17 – 2019-02-18 (×2): 50 mg via ORAL
  Filled 2019-02-17 (×2): qty 1

## 2019-02-17 NOTE — Progress Notes (Signed)
Pt back on unit from CT. Desat to 70's in CT on 2L [place]. O2 increased to 3L via [place]; sats in mid-high 90's now.

## 2019-02-17 NOTE — Progress Notes (Signed)
PROGRESS NOTE  Susan [name] LZJ:[id number] DOB: Nov 06, 1960 DOA: 02/16/2019 PCP: Hoyt Koch, MD  HPI/Recap of past 24 hours: Susan [name] is a 58 y.o. female with medical history significant of asthma, type 2 diabetes, GERD, HIV, hypertension, hyperlipidemia, TIA, prior history of substance abuse and conditions listed below presenting to the hospital via EMS for evaluation of respiratory distress.  Patient is a poor historian and history provided by her is limited.  Reports 1 day history of shortness of breath, wheezing, and productive cough.  Denies any fevers, chills, or chest pain.  Denies exposure to any individual with known COVID-19.  States she has been using her asthma inhalers as prescribed.  Does not seem to know what she takes for diabetes and is not giving me any details about her diet.  No additional history could be obtained from her.  ED Course: SPO2 81% per EMS and patient was drowsy and tachypneic.  She was placed on CPAP.  Received Solu-Medrol 125 mg, Atrovent, and albuterol by EMS.  Wheezing and bilateral coarse breath sounds on examination done by ED provider.  Afebrile.  Tachycardic and tachypneic on arrival to the ED.  Blood pressure 148/81 on arrival.  ABG with pH 7.30, PCO2 55, and PO2 117.  Initially required 3.5 L supplemental oxygen and then 2 L supplemental oxygen via nasalcannula in the ED.  COVID-19 rapid test negative.  White count 13.6 with left shift.  Hemoglobin 15.2.  Platelet count 513,000, previously normal.  Blood glucose 536.  Bicarb 21, anion gap 19.  BUN 42.  Creatinine 1.4, baseline 0.7-0.8.  BNP 103.  UA pending.  Chest x-ray showing diffuse bilateral hazy airspace opacities suspicious for pulmonary edema versus atypical infectious process.   In-house COVID-19 test negative x2.  02/17/19: Patient seen and examined at her bedside.  She is somnolent however easily arousable to voices.  She does not participate in the interview and does not answer  questions although she looks up.  She does not appear in distress on [place].   Assessment/Plan: Principal Problem:   Acute respiratory failure with hypoxia (HCC) Active Problems:   Multifocal pneumonia   Acute asthma exacerbation   DKA (diabetic ketoacidoses) (HCC)   AKI (acute kidney injury) ([place])  Acute hypoxic respiratory failure likely multifactorial secondary to multifocal community-acquired pneumonia versus acute asthma exacerbation Presented with O2 saturation 81% on room air corrected with 2 L of oxygen by nasal cannula to greater than 92% Continue inhalers RVP negative COVID-19 in-house test negative x2 Independent review chest x-ray done on admission which showed bilateral patchy pulmonary infiltrates Continue to maintain O2 saturation greater than 92%  Sepsis secondary to multifocal community-acquired pneumonia Presented with leukocytosis, tachycardia and tachypnea Monitor fever curve and WBC Continue Rocephin and azithromycin Maintain O2 sats greater than 92% Continue inhalers Blood cultures x2 in process  Resolving DKA Presented with a non-gap metabolic acidosis with gap of 19 and bicarb of 21 Serum chemistry with resolved metabolic acidosis Anion gap 11 Transition to start to DKA protocol Continue IV fluid hydration Hemoglobin A1c 9.5 on 02/17/2019 Diabetes coordinator Continue insulin coverage  Resolved AKI Baseline creatinine appears to be 0.9 with GFR of greater than 60 Presented with creatinine of 1.42 and GFR 48 Creatinine on 02/17/2027 0.9 Monitor urine output On IV fluid reduce rate  Peripheral edema We will decrease the rate of IV fluid Closely monitor fluid status Takes HCTZ at home  Thrombocytosis Platelet count 513,000, previously normal.  Possibly due to  hemoconcentration as white count and hemoglobin also slightly elevated. -Repeat CBC in a.m.  HIV -Continue Biktarvy  Hypertension -Currently normotensive.  Continue home clonidine.   Hyperlipidemia -Continue Crestor  Anxiety, depression, schizophrenia -Continue home medications  Morbid obesity When stable recommend weight loss outpatient with regular physical activity and healthy dieting   DVT prophylaxis: Lovenox subcu daily Code Status: Full code Family Communication: No family available. Disposition Plan:  Discharge possibly in 2 to 3 days when hemodynamically stable and hypoxia has improved.   Consults called: None    Objective: Vitals:   02/17/19 0435 02/17/19 0539 02/17/19 0653 02/17/19 0821  BP: (!) 163/80 (!) 151/73 (!) 138/56 121/65  Pulse: 94 91 87 85  Resp: 18  19 14   Temp: (!) 96.9 F (36.1 C)  (!) 97.3 F (36.3 C) (!) 97.2 F (36.2 C)  TempSrc: Oral   Axillary  SpO2: 100% 96% 99% 98%  Weight: 114.4 kg     Height: 5\' 6"  (1.676 m)       Intake/Output Summary (Last 24 hours) at 02/17/2019 0909 Last data filed at 02/17/2019 3570 Gross per 24 hour  Intake 450 ml  Output 1200 ml  Net -750 ml   Filed Weights   02/16/19 2129 02/17/19 0435  Weight: 109 kg 114.4 kg    Exam:  . General: 58 y.o. year-old female morbidly obese in no acute distress.  Somnolent but easily arousable to voices. . Cardiovascular: Regular rate and rhythm with no rubs or gallops.  No thyromegaly or JVD noted.   [name] Respiratory: Diffuse rales bilaterally with no wheezes noted.  Poor inspiratory effort.   . Abdomen: Obese mildly distended.  Nontender on palpation.  Bowel sounds present.   . Musculoskeletal: No lower extremity edema. 2/4 pulses in all 4 extremities. . Skin: No ulcerative lesions noted or rashes, . Psychiatry: Mood is appropriate for condition and setting   Data Reviewed: CBC: Recent Labs  Lab 02/16/19 1831 02/16/19 1853 02/17/19 0646  WBC  --  13.6* PENDING  NEUTROABS  --  9.9* PENDING  HGB 16.7* 15.2* 14.0  HCT 49.0* 46.1* 42.6  MCV  --  86.8 84.9  PLT  --  513* 177*   Basic Metabolic Panel: Recent Labs  Lab 02/16/19 1831 02/16/19  1853 02/16/19 2232 02/17/19 0309 02/17/19 0646  NA 129* 129* 131* 133* 135  K 3.5 3.6 4.3 3.3* 3.3*  CL  --  89* 91* 96* 98  CO2  --  21* 18* 25 26  GLUCOSE  --  536* 532* 303* 189*  BUN  --  42* 42* 40* 35*  CREATININE  --  1.42* 1.50* 1.17* 0.99  CALCIUM  --  8.5* 8.1* 8.1* 8.2*   GFR: Estimated Creatinine Clearance: 81.4 mL/min (by C-G formula based on SCr of 0.99 mg/dL). Liver Function Tests: No results for input(s): AST, ALT, ALKPHOS, BILITOT, PROT, ALBUMIN in the last 168 hours. No results for input(s): LIPASE, AMYLASE in the last 168 hours. No results for input(s): AMMONIA in the last 168 hours. Coagulation Profile: No results for input(s): INR, PROTIME in the last 168 hours. Cardiac Enzymes: Recent Labs  Lab 02/16/19 2232  TROPONINI <0.03   BNP (last 3 results) No results for input(s): PROBNP in the last 8760 hours. HbA1C: Recent Labs    02/17/19 0309  HGBA1C 9.5*   CBG: Recent Labs  Lab 02/17/19 0220 02/17/19 0327 02/17/19 0446 02/17/19 0556 02/17/19 0650  GLUCAP 307* 259* 197* 186* 171*   Lipid Profile:  No results for input(s): CHOL, HDL, LDLCALC, TRIG, CHOLHDL, LDLDIRECT in the last 72 hours. Thyroid Function Tests: No results for input(s): TSH, T4TOTAL, FREET4, T3FREE, THYROIDAB in the last 72 hours. Anemia Panel: Recent Labs    02/16/19 2232  FERRITIN 602*   Urine analysis:    Component Value Date/Time   COLORURINE YELLOW 02/17/2019 0708   APPEARANCEUR HAZY (A) 02/17/2019 0708   LABSPEC 1.026 02/17/2019 0708   PHURINE 5.0 02/17/2019 0708   GLUCOSEU >=500 (A) 02/17/2019 0708   HGBUR NEGATIVE 02/17/2019 0708   BILIRUBINUR NEGATIVE 02/17/2019 0708   KETONESUR 5 (A) 02/17/2019 0708   PROTEINUR 30 (A) 02/17/2019 0708   UROBILINOGEN 1.0 06/01/2015 0104   NITRITE NEGATIVE 02/17/2019 0708   LEUKOCYTESUR NEGATIVE 02/17/2019 0708   Sepsis Labs: @LABRCNTIP (procalcitonin:4,lacticidven:4)  ) Recent Results (from the past 240 hour(s))  SARS  Coronavirus 2     Status: None   Collection Time: 02/16/19  6:09 PM  Result Value Ref Range Status   SARS Coronavirus 2 NOT DETECTED NOT DETECTED Final    Comment: (NOTE) SARS-CoV-2 target nucleic acids are NOT DETECTED. The SARS-CoV-2 RNA is generally detectable in upper and lower respiratory specimens during the acute phase of infection.  Negative  results do not preclude SARS-CoV-2 infection, do not rule out co-infections with other pathogens, and should not be used as the sole basis for treatment or other patient management decisions.  Negative results must be combined with clinical observations, patient history, and epidemiological information. The expected result is Not Detected. Fact Sheet for Patients: http://www.biofiredefense.com/wp-content/uploads/2020/03/BIOFIRE-COVID -19-patients.pdf Fact Sheet for Healthcare Providers: http://www.biofiredefense.com/wp-content/uploads/2020/03/BIOFIRE-COVID -19-hcp.pdf This test is not yet approved or cleared by the [place] and  has been authorized for detection and/or diagnosis of SARS-CoV-2 by FDA under an Emergency Use Authorization (EUA).  This EUA will remain in effec t (meaning this test can be used) for the duration of  the COVID-19 declaration under Section 564(b)(1) of the Act, 21 U.S.C. section 360bbb-3(b)(1), unless the authorization is terminated or revoked sooner. Performed at [facility], [place] [address] [place], [place] [address]   SARS Coronavirus 2     Status: None   Collection Time: 02/16/19 11:16 PM  Result Value Ref Range Status   SARS Coronavirus 2 NOT DETECTED NOT DETECTED Final    Comment: (NOTE) SARS-CoV-2 target nucleic acids are NOT DETECTED. The SARS-CoV-2 RNA is generally detectable in upper and lower respiratory specimens during the acute phase of infection.  Negative  results do not preclude SARS-CoV-2 infection, do not rule out co-infections with other pathogens, and should not be  used as the sole basis for treatment or other patient management decisions.  Negative results must be combined with clinical observations, patient history, and epidemiological information. The expected result is Not Detected. Fact Sheet for Patients: http://www.biofiredefense.com/wp-content/uploads/2020/03/BIOFIRE-COVID -19-patients.pdf Fact Sheet for Healthcare Providers: http://www.biofiredefense.com/wp-content/uploads/2020/03/BIOFIRE-COVID -19-hcp.pdf This test is not yet approved or cleared by the [place] and  has been authorized for detection and/or diagnosis of SARS-CoV-2 by FDA under an Emergency Use Authorization (EUA).  This EUA will remain in effec t (meaning this test can be used) for the duration of  the COVID-19 declaration under Section 564(b)(1) of the Act, 21 U.S.C. section 360bbb-3(b)(1), unless the authorization is terminated or revoked sooner. Performed at [facility], [place] [address], [place],  [address]   Respiratory Panel by PCR     Status: None   Collection Time: 02/17/19  3:19 AM   Specimen: Nasopharyngeal  Swab; Respiratory  Result Value Ref Range Status   Adenovirus NOT DETECTED NOT DETECTED Final   Coronavirus 229E NOT DETECTED NOT DETECTED Final    Comment: (NOTE) The Coronavirus on the Respiratory Panel, DOES NOT test for the novel  Coronavirus (2019 nCoV)    Coronavirus HKU1 NOT DETECTED NOT DETECTED Final   Coronavirus NL63 NOT DETECTED NOT DETECTED Final   Coronavirus OC43 NOT DETECTED NOT DETECTED Final   Metapneumovirus NOT DETECTED NOT DETECTED Final   Rhinovirus / Enterovirus NOT DETECTED NOT DETECTED Final   Influenza A NOT DETECTED NOT DETECTED Final   Influenza B NOT DETECTED NOT DETECTED Final   Parainfluenza Virus 1 NOT DETECTED NOT DETECTED Final   Parainfluenza Virus 2 NOT DETECTED NOT DETECTED Final   Parainfluenza Virus 3 NOT DETECTED NOT DETECTED Final   Parainfluenza Virus 4 NOT DETECTED NOT DETECTED Final    Respiratory Syncytial Virus NOT DETECTED NOT DETECTED Final   Bordetella pertussis NOT DETECTED NOT DETECTED Final   Chlamydophila pneumoniae NOT DETECTED NOT DETECTED Final   Mycoplasma pneumoniae NOT DETECTED NOT DETECTED Final    Comment: Performed at [facility], [place] [address] [place], [place] [address]  MRSA PCR Screening     Status: None   Collection Time: 02/17/19  4:54 AM   Specimen: Nasal Mucosa; Nasopharyngeal  Result Value Ref Range Status   MRSA by PCR NEGATIVE NEGATIVE Final    Comment:        The GeneXpert MRSA Assay (FDA approved for NASAL specimens only), is one component of a comprehensive MRSA colonization surveillance program. It is not intended to diagnose MRSA infection nor to guide or monitor treatment for MRSA infections. Performed at [facility], [place] [address] [place], [place] [address]       Studies: Ct Angio Chest Pe W Or Wo Contrast  Result Date: 02/17/2019 CLINICAL DATA:  Respiratory distress.  Productive cough. EXAM: CT ANGIOGRAPHY CHEST WITH CONTRAST TECHNIQUE: Multidetector CT imaging of the chest was performed using the standard protocol during bolus administration of intravenous contrast. Multiplanar CT image reconstructions and MIPs were obtained to evaluate the vascular anatomy. CONTRAST:  53mL OMNIPAQUE IOHEXOL 350 MG/ML SOLN COMPARISON:  One-view chest x-ray 02/16/2019 FINDINGS: Cardiovascular: Heart size is normal. Coronary artery calcifications are present. Atherosclerotic changes are noted at the aorta. Great vessel origins are within normal limits. There is no aneurysm. Pulmonary artery opacification is satisfactory. The study is moderately degraded by patient motion. No significant filling defect is present to suggest pulmonary embolus. Mediastinum/Nodes: Subcentimeter prevascular and subcarinal nodes are present. No significant hilar or axillary adenopathy is present. Lungs/Pleura: Extensive patchy bilateral airspace disease  is present bilaterally. This involves the upper and lower lobes. Airways are patent. There is no pneumothorax or significant pleural effusion. No necrosis is present. Upper Abdomen: Stomach is moderately distended. Limited imaging of the abdomen is otherwise unremarkable. Musculoskeletal: Fused anterior osteophytes in the thoracic spine are consistent with DISH. Vertebral body heights alignment are maintained. No focal lytic or blastic lesions are present. Ribs are unremarkable. Review of the MIP images confirms the above findings. IMPRESSION: 1. Diffuse bilateral patchy airspace disease consistent with multi lobar pneumonia. 2. No pulmonary embolus. 3.  Aortic Atherosclerosis (ICD10-I70.0). Electronically Signed   By: San Morelle M.D.   On: 02/17/2019 05:50   Dg Chest Portable 1 View  Result Date: 02/16/2019 CLINICAL DATA:  Dyspnea and wheezing. EXAM: PORTABLE CHEST 1 VIEW COMPARISON:  12/02/2018 FINDINGS: The heart size is  enlarged. There are diffuse bilateral hazy airspace opacities. No pneumothorax. No large pleural effusion. There is no acute osseous abnormality. IMPRESSION: Diffuse bilateral hazy airspace opacities which can be seen in patients with pulmonary edema or an atypical infectious process such as viral pneumonia. Electronically Signed   By: Constance Holster M.D.   On: 02/16/2019 19:01    Scheduled Meds: . benztropine  0.5 mg Oral Daily  . bictegravir-emtricitabine-tenofovir AF  1 tablet Oral Daily  . busPIRone  15 mg Oral TID  . cloNIDine  0.2 mg Oral BID  . dextromethorphan-guaiFENesin  2 tablet Oral BID  . doxepin  50 mg Oral QHS  . enoxaparin (LOVENOX) injection  40 mg Subcutaneous Q24H  . gabapentin  600 mg Oral TID  . Ipratropium-Albuterol  2 puff Inhalation Q6H  . methylPREDNISolone (SOLU-MEDROL) injection  60 mg Intravenous Q8H  . pantoprazole  20 mg Oral Daily  . potassium chloride  40 mEq Oral Once  . QUEtiapine  200 mg Oral Daily  . QUEtiapine  400 mg Oral  QHS  . rosuvastatin  20 mg Oral Daily  . sertraline  50 mg Oral Daily    Continuous Infusions: . sodium chloride 150 mL/hr at 02/17/19 0029  . azithromycin (ZITHROMAX) 500 MG IVPB (Vial-Mate [employer])    . cefTRIAXone (ROCEPHIN)  IV    . dextrose 5 % and 0.45% NaCl Stopped (02/17/19 0012)  . insulin 5.6 Units/hr (02/17/19 0651)     LOS: 1 day     Kayleen Memos, MD [facility] Pager [phone]  If 7PM-7AM, please contact night-coverage [email or web address] Password [email or web address] 02/17/2019, 9:09 AM

## 2019-02-17 NOTE — Progress Notes (Addendum)
Inpatient Diabetes Program Recommendations  AACE/ADA: New Consensus Statement on Inpatient Glycemic Control (2015)  Target Ranges:  Prepandial:   less than 140 mg/dL      Peak postprandial:   less than 180 mg/dL (1-2 hours)      Critically ill patients:  140 - 180 mg/dL   Lab Results  Component Value Date   GLUCAP 171 (H) 02/17/2019   HGBA1C 9.5 (H) 02/17/2019    Review of Glycemic Control  Diabetes history: DM2 Outpatient Diabetes medications: Humulin N insulin 130 units qd + Metformin 1 gm bid Current orders for Inpatient glycemic control: IV insulin drip  Inpatient Diabetes Program Recommendations:   Patient is type 2 DM and sees Dr. Loanne Drilling as her endocrinologist and last office visit was 10/30/18. A1c has decreased from 11.4 on 09/24/18 to current 9.5 on 02/17/19. When transitioning to subcutaneous insulin: -Lantus 0.3 units/kg x 114.4 = 34 units -Novolog 5 units tid meal coverage tid when eating and eats 50% meals -Novolog moderate correction q 4 hrs while NPO and then tid + hs 0-5 units when eating Secure chat sent to Dr. [place] Crane. Discussed with Dr. [place] Crane via secure chat regarding regimen and agrees to above except starting with Lantus 20 units and titration up as needed.  Thank you, Nani Gasser. Jerrard Bradburn, RN, MSN, CDE  Diabetes Coordinator Inpatient Glycemic Control Team Team Pager [phone] (8am-5pm) 02/17/2019 9:50 AM

## 2019-02-17 NOTE — ED Notes (Signed)
Report rec'd from [place], [place]. Pt was asleep, difficult to arouse. Once awake, she is oriented x 4. She has rhonchi throughout, productive thick tan sputum with cough. Saturations 96-99% on 2L
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
## 2019-02-17 NOTE — Progress Notes (Signed)
Dr. Marlowe Sax paged and notified that per the [facility], hospital policy is to have Infectious Disease doctor consulted if the patient has had 2 negative rapid Covid tests and the admitting MD would still like to place patient on precautions and treat as Covid. Dr. Marlowe Sax called back at 05:23 and stated she had paged the Infectious Disease MD on call but has not heard back from them yet.

## 2019-02-18 DIAGNOSIS — J9621 Acute and chronic respiratory failure with hypoxia: Secondary | ICD-10-CM

## 2019-02-18 DIAGNOSIS — R410 Disorientation, unspecified: Secondary | ICD-10-CM

## 2019-02-18 DIAGNOSIS — J9622 Acute and chronic respiratory failure with hypercapnia: Secondary | ICD-10-CM

## 2019-02-18 DIAGNOSIS — N179 Acute kidney failure, unspecified: Secondary | ICD-10-CM

## 2019-02-18 DIAGNOSIS — F600 Paranoid personality disorder: Secondary | ICD-10-CM

## 2019-02-18 DIAGNOSIS — J9601 Acute respiratory failure with hypoxia: Secondary | ICD-10-CM

## 2019-02-18 DIAGNOSIS — J189 Pneumonia, unspecified organism: Secondary | ICD-10-CM

## 2019-02-18 DIAGNOSIS — F209 Schizophrenia, unspecified: Secondary | ICD-10-CM

## 2019-02-18 LAB — GLUCOSE, CAPILLARY
Glucose-Capillary: 304 mg/dL — ABNORMAL HIGH (ref 70–99)
Glucose-Capillary: 310 mg/dL — ABNORMAL HIGH (ref 70–99)
Glucose-Capillary: 374 mg/dL — ABNORMAL HIGH (ref 70–99)
Glucose-Capillary: 295 mg/dL — ABNORMAL HIGH (ref 70–99)
Glucose-Capillary: 301 mg/dL — ABNORMAL HIGH (ref 70–99)

## 2019-02-18 LAB — BASIC METABOLIC PANEL
Anion gap: 12 (ref 5–15)
BUN: 30 mg/dL — ABNORMAL HIGH (ref 6–20)
CO2: 24 mmol/L (ref 22–32)
Calcium: 8.1 mg/dL — ABNORMAL LOW (ref 8.9–10.3)
Chloride: 96 mmol/L — ABNORMAL LOW (ref 98–111)
Creatinine, Ser: 1.02 mg/dL — ABNORMAL HIGH (ref 0.44–1.00)
GFR calc Af Amer: >60 mL/min (ref >60)
GFR calc non Af Amer: >60 mL/min (ref >60)
Glucose, Bld: 330 mg/dL — ABNORMAL HIGH (ref 70–99)
Potassium: 4.2 mmol/L (ref 3.5–5.1)
Sodium: 132 mmol/L — ABNORMAL LOW (ref 135–145)

## 2019-02-18 MED ORDER — INSULIN GLARGINE 100 UNIT/ML ~~LOC~~ SOLN
18.0000 [IU] | Freq: Every day | SUBCUTANEOUS | Status: DC
  Administered 2019-02-18: 18 [IU] via SUBCUTANEOUS
  Filled 2019-02-18: qty 0.18

## 2019-02-18 MED ORDER — QUETIAPINE FUMARATE 25 MG PO TABS
200.0000 mg | ORAL_TABLET | Freq: Two times a day (BID) | ORAL | Status: DC
  Administered 2019-02-18 – 2019-02-19 (×2): 200 mg via ORAL
  Filled 2019-02-18 (×2): qty 8

## 2019-02-18 MED ORDER — QUETIAPINE FUMARATE 400 MG PO TABS
400.0000 mg | ORAL_TABLET | Freq: Every day | ORAL | Status: DC
  Administered 2019-02-18: 400 mg via ORAL
  Filled 2019-02-18 (×2): qty 1

## 2019-02-18 MED ORDER — INSULIN ASPART 100 UNIT/ML ~~LOC~~ SOLN
5.0000 [IU] | Freq: Three times a day (TID) | SUBCUTANEOUS | Status: DC
  Administered 2019-02-18 – 2019-02-19 (×3): 5 [IU] via SUBCUTANEOUS

## 2019-02-18 MED ORDER — IPRATROPIUM-ALBUTEROL 20-100 MCG/ACT IN AERS: 2.0000 | INHALATION_SPRAY | Freq: Three times a day (TID) | RESPIRATORY_TRACT | Status: DC

## 2019-02-18 MED ORDER — INSULIN GLARGINE 100 UNIT/ML ~~LOC~~ SOLN
22.0000 [IU] | Freq: Every day | SUBCUTANEOUS | Status: DC
  Administered 2019-02-19: 22 [IU] via SUBCUTANEOUS
  Filled 2019-02-18: qty 0.22

## 2019-02-18 MED ORDER — HALOPERIDOL LACTATE 5 MG/ML IJ SOLN
2.0000 mg | Freq: Four times a day (QID) | INTRAMUSCULAR | Status: DC | PRN
  Administered 2019-02-18: 2 mg via INTRAMUSCULAR

## 2019-02-18 MED ORDER — IPRATROPIUM-ALBUTEROL 0.5-2.5 (3) MG/3ML IN SOLN
3.0000 mL | Freq: Three times a day (TID) | RESPIRATORY_TRACT | Status: DC
  Administered 2019-02-18 – 2019-02-19 (×3): 3 mL via RESPIRATORY_TRACT
  Filled 2019-02-18 (×5): qty 3

## 2019-02-18 MED ORDER — SERTRALINE HCL 100 MG PO TABS
100.0000 mg | ORAL_TABLET | Freq: Every day | ORAL | Status: DC
  Administered 2019-02-19: 100 mg via ORAL
  Filled 2019-02-18: qty 1

## 2019-02-18 MED ORDER — HALOPERIDOL LACTATE 5 MG/ML IJ SOLN
5.0000 mg | Freq: Four times a day (QID) | INTRAMUSCULAR | Status: DC | PRN
  Administered 2019-02-18: 5 mg via INTRAMUSCULAR
  Filled 2019-02-18: qty 1

## 2019-02-18 MED ORDER — IPRATROPIUM-ALBUTEROL 0.5-2.5 (3) MG/3ML IN SOLN: 3.0000 mL | Freq: Three times a day (TID) | RESPIRATORY_TRACT | Status: DC

## 2019-02-18 MED ORDER — HALOPERIDOL LACTATE 5 MG/ML IJ SOLN
INTRAMUSCULAR | Status: AC
  Administered 2019-02-18: 11:00:00
  Filled 2019-02-18: qty 1

## 2019-02-18 MED ORDER — INSULIN ASPART 100 UNIT/ML ~~LOC~~ SOLN
5.0000 [IU] | Freq: Once | SUBCUTANEOUS | Status: AC
  Administered 2019-02-18: 5 [IU] via SUBCUTANEOUS

## 2019-02-18 MED ORDER — HALOPERIDOL LACTATE 5 MG/ML IJ SOLN
2.0000 mg | Freq: Four times a day (QID) | INTRAMUSCULAR | Status: DC | PRN
  Filled 2019-02-18: qty 1

## 2019-02-18 MED ORDER — ARIPIPRAZOLE 5 MG PO TABS
10.0000 mg | ORAL_TABLET | Freq: Every day | ORAL | Status: DC
  Administered 2019-02-18 – 2019-02-19 (×2): 10 mg via ORAL
  Filled 2019-02-18 (×2): qty 2

## 2019-02-18 NOTE — Consult Note (Signed)
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 1.00 mg/dL   Calcium 7.9 (L) 8.9 - 10.3 mg/dL   GFR calc non Af Amer >60 >60 mL/min   GFR calc Af Amer >60 >60 mL/min   Anion gap 14 5 - 15    Comment: Performed at [facility] [address], [place], [place] [address]  Glucose, capillary     Status: Abnormal   Collection Time: 02/18/19  2:59 AM  Result Value Ref Range   Glucose-Capillary 304 (H) 70 - 99 mg/dL  Basic metabolic panel     Status: Abnormal   Collection Time: 02/18/19  7:58 AM  Result Value Ref Range   Sodium 132 (L) 135 - 145 mmol/L   Potassium 4.2 3.5 - 5.1 mmol/L    Chloride 96 (L) 98 - 111 mmol/L   CO2 24 22 - 32 mmol/L   Glucose, Bld 330 (H) 70 - 99 mg/dL   BUN 30 (H) 6 - 20 mg/dL   Creatinine, Ser 1.02 (H) 0.44 - 1.00 mg/dL   Calcium 8.1 (L) 8.9 - 10.3 mg/dL   GFR calc non Af Amer >60 >60 mL/min   GFR calc Af Amer >60 >60 mL/min   Anion gap 12 5 - 15    Comment: Performed at [facility] [address], [place], [place] [address]  Glucose, capillary     Status: Abnormal   Collection Time: 02/18/19  8:01 AM  Result Value Ref Range   Glucose-Capillary 310 (H) 70 - 99 mg/dL  Glucose, capillary     Status: Abnormal   Collection Time: 02/18/19 12:00 PM  Result Value Ref Range   Glucose-Capillary 374 (H) 70 - 99 mg/dL    Medications:  Current Facility-Administered Medications  Medication Dose Route Frequency Provider Last Rate Last Dose  . 0.9 %  sodium chloride infusion   Intravenous Continuous Kayleen Memos, DO   Stopped at 02/17/19 1854  . acetaminophen (TYLENOL) tablet 650 mg  650 mg Oral Q6H PRN Shela Leff, MD   650 mg at 02/18/19 0113  . albuterol (VENTOLIN HFA) 108 (90 Base) MCG/ACT inhaler 4 puff  4 puff Inhalation Q2H PRN Shela Leff, MD   4 puff at 02/18/19 0159  . azithromycin (ZITHROMAX) 500 mg in sodium chloride 0.9 % 250 mL IVPB  500 mg Intravenous Q24H Shela Leff, MD 250 mL/hr at 02/18/19 0302 500 mg at 02/18/19 0302  . benztropine (COGENTIN) tablet 0.5 mg  0.5 mg Oral Daily Shela Leff, MD   0.5 mg at 02/18/19 1000  . bictegravir-emtricitabine-tenofovir AF (BIKTARVY) 50-200-25 MG per tablet 1 tablet  1 tablet Oral Daily Shela Leff, MD   1 tablet at 02/18/19 1000  . busPIRone (BUSPAR) tablet 15 mg  15 mg Oral TID Shela Leff, MD   15 mg at 02/18/19 1000  . cefTRIAXone (ROCEPHIN) 1 g in sodium chloride 0.9 % 100 mL IVPB  1 g Intravenous Q24H Shela Leff, MD 200 mL/hr at 02/18/19 0114 1 g at 02/18/19 0114  . cloNIDine (CATAPRES) tablet 0.2 mg  0.2 mg Oral BID Shela Leff, MD   0.2 mg at 02/18/19 1000  . dextromethorphan-guaiFENesin (MUCINEX DM) 30-600 MG per 12 hr tablet 2 tablet  2 tablet Oral BID Shela Leff, MD   2 tablet at 02/17/19 2325  . dextrose 5 %-0.45 % sodium chloride infusion   Intravenous Continuous Shela Leff, MD   Stopped at 02/17/19 0012  .  doxepin (SINEQUAN) capsule 50 mg  50 mg Oral QHS Shela Leff, MD   50 mg at 02/18/19 0113  . enoxaparin (LOVENOX) injection 40 mg  40 mg Subcutaneous Q24H Shela Leff, MD   40 mg at 02/17/19 1005  . gabapentin (NEURONTIN) capsule 600 mg  600 mg Oral TID Shela Leff, MD   600 mg at 02/18/19 1100  . haloperidol lactate (HALDOL) injection 5 mg  5 mg Intramuscular Q6H PRN Donne Hazel, MD   5 mg at 02/18/19 1056  . insulin aspart (novoLOG) injection 0-5 Units  0-5 Units Subcutaneous QHS Kayleen Memos, DO   5 Units at 02/17/19 2324  . insulin aspart (novoLOG) injection 0-9 Units  0-9 Units Subcutaneous TID WC Irene Pap N, DO   5 Units at 02/18/19 1205  . insulin aspart (novoLOG) injection 5 Units  5 Units Subcutaneous TID WC Donne Hazel, MD      . insulin glargine (LANTUS) injection 18 Units  18 Units Subcutaneous Daily Donne Hazel, MD   18 Units at 02/18/19 1203  . ipratropium-albuterol (DUONEB) 0.5-2.5 (3) MG/3ML nebulizer solution 3 mL  3 mL Nebulization TID Irene Pap N, DO   3 mL at 02/18/19 0745  . methylPREDNISolone sodium succinate (SOLU-MEDROL) 125 mg/2 mL injection 60 mg  60 mg Intravenous Q8H Shela Leff, MD   60 mg at 02/18/19 0540  . pantoprazole (PROTONIX) EC tablet 20 mg  20 mg Oral Daily Shela Leff, MD   20 mg at 02/18/19 1210  . QUEtiapine (SEROQUEL) tablet 200 mg  200 mg Oral Daily Shela Leff, MD   200 mg at 02/18/19 1210  . QUEtiapine (SEROQUEL) tablet 400 mg  400 mg Oral QHS Shela Leff, MD   400 mg at 02/18/19 0112  . rosuvastatin (CRESTOR) tablet 20 mg  20 mg Oral Daily Shela Leff, MD   20 mg at  02/18/19 1210  . sertraline (ZOLOFT) tablet 50 mg  50 mg Oral Daily Shela Leff, MD   50 mg at 02/18/19 1210    Musculoskeletal: Strength & Muscle Tone: No atrophy noted. Gait & Station: UTA since patient is lying in bed. Patient leans: N/A  Psychiatric Specialty Exam: Physical Exam  Nursing note and vitals reviewed. Constitutional: She is oriented to person, place, and time. She appears well-developed and well-nourished.  HENT:  Head: Normocephalic and atraumatic.  Neck: Normal range of motion.  Respiratory: Effort normal.  Musculoskeletal: Normal range of motion.  Neurological: She is alert and oriented to person, place, and time.  Psychiatric: She has a normal mood and affect. Her speech is normal and behavior is normal. Judgment and thought content normal. Cognition and memory are normal.    Review of Systems  Gastrointestinal: Negative for constipation, diarrhea, nausea and vomiting.  Psychiatric/Behavioral: Negative for hallucinations, substance abuse and suicidal ideas. The patient does not have insomnia.   All other systems reviewed and are negative.   Blood pressure (!) 156/94, pulse 84, temperature 97.8 F (36.6 C), temperature source Oral, resp. rate 14, height 5\' 6"  (1.676 m), weight 114.4 kg, SpO2 98 %.Body mass index is 40.71 kg/m.  General Appearance: Fairly Groomed, middle aged, African American female, wearing a hospital gown who is lying in bed. NAD.   Eye Contact:  Good  Speech:  Clear and Coherent and Normal Rate  Volume:  Normal  Mood:  Euthymic  Affect:  Appropriate and Full Range  Thought Process:  Goal Directed and Descriptions of Associations: Intact  Orientation:  Full (Time, Place, and Person)  Thought Content:  Logical  Suicidal Thoughts:  No  Homicidal Thoughts:  No  Memory:  Immediate;   Good Recent;   Good Remote;   Good  Judgement:  Fair  Insight:  Fair  Psychomotor Activity:  Normal  Concentration:  Concentration: Good and  Attention Span: Good  Recall:  Good  Fund of Knowledge:  Good  Language:  Good  Akathisia:  No  Handed:  Right  AIMS (if indicated):   N/A  Assets:  Communication Skills Desire for Improvement Housing Resilience Social Support  ADL's:  Intact  Cognition:  WNL  Sleep:   Okay   Assessment:  Susan [name] is a 58 y.o. female who was admitted with acute respiratory failure with hypoxia likely multifactorial secondary to multifocal community acquired pneumonia versus acute asthma exacerbation. Patient became paranoid and agitated requiring Haldol and physical restraints. She is clear in mental status and appropriate in behavior on interview. Patient and daughter report that she did not have her medication for a week since it was not delivered. Recommend resuming her home psychotropic medications for psychosis/mood stabilization. She denies SI, HI or AVH. She does not appear to be responding to internal stimuli.   Treatment Plan Summary: -Restart Seroquel 200 mg BID and 400 mg qhs for psychosis/mood stabilization. Can increase by 200 mg daily until reach daily home dose. -Restart Zoloft 100 mg daily for mood. -Restart Abilify 10 mg daily for mood stabilization. -Patient is prescribed Atarax 100 mg QID PRN and Klonopin 2 mg TID PRN for anxiety. Per daughter she has not been requiring these medications so would not recommend restarting unless needed and then would recommended giving a lower dose given concern for excessive sedation.  -Patient should follow up with [facility] for medication [facility].  -Psychiatry will sign off on patient at this time. Please consult psychiatry again as needed.     Disposition: No evidence of imminent risk to self or others at present.   Patient does not meet criteria for psychiatric inpatient admission.  This service was provided via telemedicine using a 2-way, interactive audio and video technology.  Names of all persons participating in this telemedicine  service and their role in this encounter. Name: Buford Dresser, DO Role: Psychiatrist  Name: Susan [name] Role: Patient    Faythe Dingwall, DO 02/18/2019 1:59 PM

## 2019-02-18 NOTE — Progress Notes (Signed)
02/18/2019 11:22 AM  Informed by nursing staff that patient is adamant about leaving against medical advice. Per staff report, patient is not alert and orientated; security present at front desk with patient.   Upon arrival patient verbalized "staff were sprinkling candy all over the desk and trying to force things in her mouth". This Probation officer encouraged the patient to go back to her room to show to physically show me her concerns. Once in patient room, it was visibly noted that medications were on the floor with pill cup.   Patient verbalized that she needed to leave immediately but would only allow this writer to give her medication. Check eMAR.   Spoke with patient best friend Amy and Daughter Aquinna to assist patient in calming down and agreeing to stay for further treatment. Daughter stated that "she is HPOA over her mother and that her mother has severe mental health issues''. Daughter verbalized understanding of giving the patient medication and applying restraints until patient calms down. Updated daughter contact information.   New orders written.  Assisted RN in applying bilateral wrist restraints and 4 side rails. RN placed monitoring devices back on the patient.   Alisandra Son MSN, RN-BC, CNML [facility] Phone: [phone]

## 2019-02-18 NOTE — Progress Notes (Signed)
PROGRESS NOTE    Susan [name]  AVW:[id number] DOB: April 13, 1961 DOA: 02/16/2019 PCP: Hoyt Koch, MD    Brief Narrative:  58 y.o.femalewith medical history significant ofasthma, type 2 diabetes, GERD, HIV, hypertension, hyperlipidemia, TIA, prior history of substance abuse and conditions listed below presenting to the hospital via EMS for evaluation of respiratory distress.Patient is a poor historian and history provided by her islimited. Reports 1 day history of shortness of breath, wheezing, and productive cough. Denies any fevers, chills, or chest pain. Denies exposure to any individual with known COVID-19. States she has been using her asthma inhalers as prescribed. Does not seem to know what she takes for diabetes and is not giving me any details about her diet. No additional history could be obtained from her.  ED Course:SPO2 81% per EMS and patient was drowsy and tachypneic. She was placed on CPAP. Received Solu-Medrol 125 mg, Atrovent, and albuterol by EMS. Wheezing and bilateral coarse breath sounds on examination done by ED provider. Afebrile. Tachycardic and tachypneic on arrival to the ED. Blood pressure 148/81 on arrival. ABG with pH 7.30, PCO2 55, and PO2 117. Initially required 3.5 L supplemental oxygen and then 2 L supplemental oxygen via nasalcannula in the ED. COVID-19 rapid test negative. White count 13.6 with left shift. Hemoglobin 15.2. Platelet count 513,000, previously normal. Blood glucose 536. Bicarb 21, anion gap 19. BUN 42. Creatinine 1.4, baseline 0.7-0.8. BNP 103. UA pending. Chest x-ray showing diffuse bilateral hazy airspace opacities suspicious for pulmonary edema versus atypical infectious process.  In-house COVID-19 test negative x2.  Assessment & Plan:   Principal Problem:   Acute respiratory failure with hypoxia (HCC) Active Problems:   Multifocal pneumonia   Acute asthma exacerbation   DKA (diabetic ketoacidoses)  (HCC)   AKI (acute kidney injury) ([facility])  Acute hypoxic respiratory failure likely multifactorial secondary to multifocal community-acquired pneumonia versus acute asthma exacerbation Presented with O2 saturation 81% on room air corrected with 2 L of oxygen by nasal cannula to greater than 92% Continue inhalers RVP negative COVID-19 in-house test negative x2 Bilateral pulm infiltrates noted Pt is continued on  Continue to maintain O2 saturation greater than 92%  Sepsis secondary to multifocal community-acquired pneumonia Presented with leukocytosis, tachycardia and tachypnea Monitor fever curve and WBC Continue Rocephin and azithromycin as tolerated Cont to wean O2 as tolerated Continue inhalers as needed Will order blood cultures  Resolving DKA Pt presented with non-gap metabolic acidosis with gap of 19 and bicarb of 21 Serum chemistry with resolved metabolic acidosis Anion gap had closed Hemoglobin A1c 9.5 on 02/17/2019 Diabetes coordinator Glucose noted to be in the 300's. Have increased lantus to 18 units and added meal coverage  Resolved AKI Baseline creatinine appears to be 0.9 with GFR of greater than 60 Presented with creatinine of 1.42 and GFR 48 Cr improved with IVF hydratrion Repeat bmet in AM  Peripheral edema IVF rate decreased 6/16 Repeat bmet in AM  Thrombocytosis Platelet count 513,000, previously normal. Possibly due to hemoconcentration as white count and hemoglobin also slightly elevated. -recheck cbc in AM  HIV -Continue Biktarvy as tolerated  Hypertension -BP suboptimally controlled.  -On clonidine. Cont to adjust bp meds as tolerated  Hyperlipidemia -Continue Crestor  Anxiety, depression, schizophrenia -Patient noted to be markedly agitated and combative this AM towards all staff -Chart reviewed. Pt is well known to behavioral service in past -Patient is oriented to place, time, and has insight to her current diagnosis, however  lacks decision making capacity  and insight regarding her plan of care -Have already discussed with Psychiatry who will see patient in consultation -After discussion with psychiatry, given concern of lack of insight to plan of care, will involuntarily commit patient. Discussed with patient's daughter over phone who agrees with plan -Of note, per patient's daughter, patient had been without seroquel -Also per patient's daughter, patient reported to intermittently "not know where she is" prior to admit even when well  Morbid obesity -Recommend diet/lifestyle modification  DVT prophylaxis: Lovenox subq Code Status: Full Family Communication: Pt in room Disposition Plan: Uncertain at this time  Consultants:   Psychiatry  Procedures:     Antimicrobials: Anti-infectives (From admission, onward)   Start     Dose/Rate Route Frequency Ordered Stop   02/17/19 2100  azithromycin (ZITHROMAX) 500 mg in sodium chloride 0.9 % 250 mL IVPB     500 mg 250 mL/hr over 60 Minutes Intravenous Every 24 hours 02/17/19 0316     02/17/19 2100  cefTRIAXone (ROCEPHIN) 1 g in sodium chloride 0.9 % 100 mL IVPB     1 g 200 mL/hr over 30 Minutes Intravenous Every 24 hours 02/17/19 0316     02/17/19 1000  bictegravir-emtricitabine-tenofovir AF (BIKTARVY) 50-200-25 MG per tablet 1 tablet     1 tablet Oral Daily 02/17/19 0344     02/16/19 2100  azithromycin (ZITHROMAX) 500 mg in sodium chloride 0.9 % 250 mL IVPB     500 mg 250 mL/hr over 60 Minutes Intravenous  Once 02/16/19 2050 02/17/19 0223   02/16/19 2015  cefTRIAXone (ROCEPHIN) 1 g in sodium chloride 0.9 % 100 mL IVPB     1 g 200 mL/hr over 30 Minutes Intravenous  Once 02/16/19 2007 02/16/19 2120   02/16/19 2015  azithromycin (ZITHROMAX) 500 mg in sodium chloride 0.9 % 250 mL IVPB  Status:  Discontinued     500 mg 250 mL/hr over 60 Minutes Intravenous  Once 02/16/19 2007 02/16/19 2251       Subjective: Agitated this AM, and angry  Objective:  Vitals:   02/18/19 1140 02/18/19 1150 02/18/19 1200 02/18/19 1202  BP:   (!) 156/94 (!) 156/94  Pulse: 86 85 88 84  Resp: 13 14 14 14   Temp:    97.8 F (36.6 C)  TempSrc:    Oral  SpO2: 98% 98% 99% 98%  Weight:      Height:        Intake/Output Summary (Last 24 hours) at 02/18/2019 1350 Last data filed at 02/18/2019 0800 Gross per 24 hour  Intake 1758.37 ml  Output -  Net 1758.37 ml   Filed Weights   02/16/19 2129 02/17/19 0435  Weight: 109 kg 114.4 kg    Examination:  General exam: Appears anxious, angry, yelling Respiratory system: Clear to auscultation. Respiratory effort normal. Cardiovascular system: S1 & S2 heard, RRR Gastrointestinal system: Abdomen is nondistended, soft and nontender. No organomegaly or masses felt. Normal bowel sounds heard. Central nervous system: Alert and oriented. No focal neurological deficits. Extremities: Symmetric 5 x 5 power. Skin: No rashes, lesions Psychiatry:Angry, yelling at staff  Data Reviewed: I have personally reviewed following labs and imaging studies  CBC: Recent Labs  Lab 02/16/19 1831 02/16/19 1853 02/17/19 0646  WBC  --  13.6* 12.6*  NEUTROABS  --  9.9* 9.9*  HGB 16.7* 15.2* 14.0  HCT 49.0* 46.1* 42.6  MCV  --  86.8 84.9  PLT  --  513* 973*   Basic Metabolic Panel: Recent Labs  Lab  02/16/19 2232 02/17/19 0309 02/17/19 0646 02/17/19 2208 02/18/19 0758  NA 131* 133* 135 130* 132*  K 4.3 3.3* 3.3* 4.3 4.2  CL 91* 96* 98 95* 96*  CO2 18* 25 26 21* 24  GLUCOSE 532* 303* 189* 322* 330*  BUN 42* 40* 35* 36* 30*  CREATININE 1.50* 1.17* 0.99 0.95 1.02*  CALCIUM 8.1* 8.1* 8.2* 7.9* 8.1*  MG  --   --  2.9*  --   --    GFR: Estimated Creatinine Clearance: 79 mL/min (A) (by C-G formula based on SCr of 1.02 mg/dL (H)). Liver Function Tests: No results for input(s): AST, ALT, ALKPHOS, BILITOT, PROT, ALBUMIN in the last 168 hours. No results for input(s): LIPASE, AMYLASE in the last 168 hours. No results for  input(s): AMMONIA in the last 168 hours. Coagulation Profile: No results for input(s): INR, PROTIME in the last 168 hours. Cardiac Enzymes: Recent Labs  Lab 02/16/19 2232  TROPONINI <0.03   BNP (last 3 results) No results for input(s): PROBNP in the last 8760 hours. HbA1C: Recent Labs    02/17/19 0309  HGBA1C 9.5*   CBG: Recent Labs  Lab 02/17/19 1546 02/17/19 2142 02/18/19 0259 02/18/19 0801 02/18/19 1200  GLUCAP 283* 354* 304* 310* 374*   Lipid Profile: No results for input(s): CHOL, HDL, LDLCALC, TRIG, CHOLHDL, LDLDIRECT in the last 72 hours. Thyroid Function Tests: No results for input(s): TSH, T4TOTAL, FREET4, T3FREE, THYROIDAB in the last 72 hours. Anemia Panel: Recent Labs    02/16/19 2232  FERRITIN 602*   Sepsis Labs: Recent Labs  Lab 02/16/19 2232  PROCALCITON 1.64    Recent Results (from the past 240 hour(s))  SARS Coronavirus 2     Status: None   Collection Time: 02/16/19  6:09 PM  Result Value Ref Range Status   SARS Coronavirus 2 NOT DETECTED NOT DETECTED Final    Comment: (NOTE) SARS-CoV-2 target nucleic acids are NOT DETECTED. The SARS-CoV-2 RNA is generally detectable in upper and lower respiratory specimens during the acute phase of infection.  Negative  results do not preclude SARS-CoV-2 infection, do not rule out co-infections with other pathogens, and should not be used as the sole basis for treatment or other patient management decisions.  Negative results must be combined with clinical observations, patient history, and epidemiological information. The expected result is Not Detected. Fact Sheet for Patients: http://www.biofiredefense.com/wp-content/uploads/2020/03/BIOFIRE-COVID -19-patients.pdf Fact Sheet for Healthcare Providers: http://www.biofiredefense.com/wp-content/uploads/2020/03/BIOFIRE-COVID -19-hcp.pdf This test is not yet approved or cleared by the [place] and  has been authorized for detection and/or  diagnosis of SARS-CoV-2 by FDA under an Emergency Use Authorization (EUA).  This EUA will remain in effec t (meaning this test can be used) for the duration of  the COVID-19 declaration under Section 564(b)(1) of the Act, 21 U.S.C. section 360bbb-3(b)(1), unless the authorization is terminated or revoked sooner. Performed at [facility], [place] [address], [place], [place] [address]   SARS Coronavirus 2     Status: None   Collection Time: 02/16/19 11:16 PM  Result Value Ref Range Status   SARS Coronavirus 2 NOT DETECTED NOT DETECTED Final    Comment: (NOTE) SARS-CoV-2 target nucleic acids are NOT DETECTED. The SARS-CoV-2 RNA is generally detectable in upper and lower respiratory specimens during the acute phase of infection.  Negative  results do not preclude SARS-CoV-2 infection, do not rule out co-infections with other pathogens, and should not be used as the sole basis for treatment or other patient management decisions.  Negative results must be combined with clinical observations, patient history, and epidemiological information. The expected result is Not Detected. Fact Sheet for Patients: http://www.biofiredefense.com/wp-content/uploads/2020/03/BIOFIRE-COVID -19-patients.pdf Fact Sheet for Healthcare Providers: http://www.biofiredefense.com/wp-content/uploads/2020/03/BIOFIRE-COVID -19-hcp.pdf This test is not yet approved or cleared by the [place] and  has been authorized for detection and/or diagnosis of SARS-CoV-2 by FDA under an Emergency Use Authorization (EUA).  This EUA will remain in effec t (meaning this test can be used) for the duration of  the COVID-19 declaration under Section 564(b)(1) of the Act, 21 U.S.C. section 360bbb-3(b)(1), unless the authorization is terminated or revoked sooner. Performed at [facility], [place] [address] [place], [place] [address]   Respiratory Panel by PCR     Status: None   Collection Time: 02/17/19  3:19 AM    Specimen: Nasopharyngeal Swab; Respiratory  Result Value Ref Range Status   Adenovirus NOT DETECTED NOT DETECTED Final   Coronavirus 229E NOT DETECTED NOT DETECTED Final    Comment: (NOTE) The Coronavirus on the Respiratory Panel, DOES NOT test for the novel  Coronavirus (2019 nCoV)    Coronavirus HKU1 NOT DETECTED NOT DETECTED Final   Coronavirus NL63 NOT DETECTED NOT DETECTED Final   Coronavirus OC43 NOT DETECTED NOT DETECTED Final   Metapneumovirus NOT DETECTED NOT DETECTED Final   Rhinovirus / Enterovirus NOT DETECTED NOT DETECTED Final   Influenza A NOT DETECTED NOT DETECTED Final   Influenza B NOT DETECTED NOT DETECTED Final   Parainfluenza Virus 1 NOT DETECTED NOT DETECTED Final   Parainfluenza Virus 2 NOT DETECTED NOT DETECTED Final   Parainfluenza Virus 3 NOT DETECTED NOT DETECTED Final   Parainfluenza Virus 4 NOT DETECTED NOT DETECTED Final   Respiratory Syncytial Virus NOT DETECTED NOT DETECTED Final   Bordetella pertussis NOT DETECTED NOT DETECTED Final   Chlamydophila pneumoniae NOT DETECTED NOT DETECTED Final   Mycoplasma pneumoniae NOT DETECTED NOT DETECTED Final    Comment: Performed at [facility], [place]. [address] [place], [place] [address]  MRSA PCR Screening     Status: None   Collection Time: 02/17/19  4:54 AM   Specimen: Nasal Mucosa; Nasopharyngeal  Result Value Ref Range Status   MRSA by PCR NEGATIVE NEGATIVE Final    Comment:        The GeneXpert MRSA Assay (FDA approved for NASAL specimens only), is one component of a comprehensive MRSA colonization surveillance program. It is not intended to diagnose MRSA infection nor to guide or monitor treatment for MRSA infections. Performed at [facility], [place] [address] [place],  [address]      Radiology Studies: Ct Angio Chest Pe W Or Wo Contrast  Result Date: 02/17/2019 CLINICAL DATA:  Respiratory distress.  Productive cough. EXAM: CT ANGIOGRAPHY CHEST WITH CONTRAST TECHNIQUE:  Multidetector CT imaging of the chest was performed using the standard protocol during bolus administration of intravenous contrast. Multiplanar CT image reconstructions and MIPs were obtained to evaluate the vascular anatomy. CONTRAST:  84mL OMNIPAQUE IOHEXOL 350 MG/ML SOLN COMPARISON:  One-view chest x-ray 02/16/2019 FINDINGS: Cardiovascular: Heart size is normal. Coronary artery calcifications are present. Atherosclerotic changes are noted at the aorta. Great vessel origins are within normal limits. There is no aneurysm. Pulmonary artery opacification is satisfactory. The study is moderately degraded by patient motion. No significant filling defect is present to suggest pulmonary embolus. Mediastinum/Nodes: Subcentimeter prevascular and subcarinal nodes are present. No significant hilar or axillary adenopathy is present. Lungs/Pleura: Extensive patchy bilateral airspace disease is  present bilaterally. This involves the upper and lower lobes. Airways are patent. There is no pneumothorax or significant pleural effusion. No necrosis is present. Upper Abdomen: Stomach is moderately distended. Limited imaging of the abdomen is otherwise unremarkable. Musculoskeletal: Fused anterior osteophytes in the thoracic spine are consistent with DISH. Vertebral body heights alignment are maintained. No focal lytic or blastic lesions are present. Ribs are unremarkable. Review of the MIP images confirms the above findings. IMPRESSION: 1. Diffuse bilateral patchy airspace disease consistent with multi lobar pneumonia. 2. No pulmonary embolus. 3.  Aortic Atherosclerosis (ICD10-I70.0). Electronically Signed   By: San Morelle M.D.   On: 02/17/2019 05:50   Dg Chest Portable 1 View  Result Date: 02/16/2019 CLINICAL DATA:  Dyspnea and wheezing. EXAM: PORTABLE CHEST 1 VIEW COMPARISON:  12/02/2018 FINDINGS: The heart size is enlarged. There are diffuse bilateral hazy airspace opacities. No pneumothorax. No large pleural  effusion. There is no acute osseous abnormality. IMPRESSION: Diffuse bilateral hazy airspace opacities which can be seen in patients with pulmonary edema or an atypical infectious process such as viral pneumonia. Electronically Signed   By: Constance Holster M.D.   On: 02/16/2019 19:01    Scheduled Meds: . benztropine  0.5 mg Oral Daily  . bictegravir-emtricitabine-tenofovir AF  1 tablet Oral Daily  . busPIRone  15 mg Oral TID  . cloNIDine  0.2 mg Oral BID  . dextromethorphan-guaiFENesin  2 tablet Oral BID  . doxepin  50 mg Oral QHS  . enoxaparin (LOVENOX) injection  40 mg Subcutaneous Q24H  . gabapentin  600 mg Oral TID  . insulin aspart  0-5 Units Subcutaneous QHS  . insulin aspart  0-9 Units Subcutaneous TID WC  . insulin aspart  5 Units Subcutaneous TID WC  . insulin glargine  18 Units Subcutaneous Daily  . ipratropium-albuterol  3 mL Nebulization TID  . methylPREDNISolone (SOLU-MEDROL) injection  60 mg Intravenous Q8H  . pantoprazole  20 mg Oral Daily  . QUEtiapine  200 mg Oral Daily  . QUEtiapine  400 mg Oral QHS  . rosuvastatin  20 mg Oral Daily  . sertraline  50 mg Oral Daily   Continuous Infusions: . sodium chloride Stopped (02/17/19 1854)  . azithromycin (ZITHROMAX) 500 MG IVPB (Vial-Mate [employer]) 500 mg (02/18/19 0302)  . cefTRIAXone (ROCEPHIN)  IV 1 g (02/18/19 0114)  . dextrose 5 % and 0.45% NaCl Stopped (02/17/19 0012)     LOS: 2 days   Marylu Lund, MD [facility] Pager On Amion  If 7PM-7AM, please contact night-coverage 02/18/2019, 1:50 PM

## 2019-02-18 NOTE — Progress Notes (Signed)
BP 173/98, HR 86.    Provider on call notified of BP.  Pt received scheduled catapres at 2330.  Is resting comfortably in bed.  No acute distress.

## 2019-02-18 NOTE — Plan of Care (Signed)
  Problem: Activity: Goal: Risk for activity intolerance will decrease Outcome: Progressing  Pt OOB to BSC, tolerated activity well.

## 2019-02-18 NOTE — Evaluation (Signed)
Physical Therapy Evaluation Patient Details Name: Susan [name] MRN: [id number] DOB: 08-08-1961 Today's Date: 02/18/2019   History of Present Illness  58 y.o. female with medical history significant of asthma, type 2 diabetes, GERD, HIV, hypertension, hyperlipidemia, TIA, schizophrenia, and h/o substance abuse.  She presented to the hospital via EMS for evaluation of respiratory distress.    Clinical Impression  Order received for PT eval. Pt initially pleasant and receptive. Upon sitting EOB, pt became agitated. Yelling for therapist to leave the room. RN arrived to assist with de-escalating situation. Pt was observed ambulating in hallway without assist or AD. Pt continues to be agitated and aggressive toward staff. Security currently present. PT signing off.     Follow Up Recommendations No PT follow up    Equipment Recommendations  None recommended by PT    Recommendations for Other Services       Precautions / Restrictions Precautions Precautions: Fall;Other (comment) Precaution Comments: agitated, hullucinating Restrictions Weight Bearing Restrictions: No      Mobility  Bed Mobility Overal bed mobility: Needs Assistance Bed Mobility: Supine to Sit     Supine to sit: Supervision;HOB elevated     General bed mobility comments: +rail. Upon sitting EOB, pt became agressive and agitated. Unwilling to participate in therapy. Yelling for therapist to get out of room. Calling out for help. RN arrived to assist and de-escalate.  Transfers                 General transfer comment: Pt observed standing without assist.  Ambulation/Gait             General Gait Details: Pt observed ambulating to nursing station, supervised by RN. No AD utilized.  Stairs            Wheelchair Mobility    Modified Rankin (Stroke Patients Only)       Balance                                             Pertinent Vitals/Pain Pain Assessment:  No/denies pain    Home Living Family/patient expects to be discharged to:: Private residence                 Additional Comments: Agitated and uncooperative during eval. Unable to attain history.    Prior Function                 Hand Dominance        Extremity/Trunk Assessment                Communication   Communication: No difficulties  Cognition Arousal/Alertness: Awake/alert Behavior During Therapy: Agitated;Impulsive Overall Cognitive Status: No family/caregiver present to determine baseline cognitive functioning                                 General Comments: Paranoid. Hallucinating. Uncooperative.      General Comments      Exercises     Assessment/Plan    PT Assessment Patent does not need any further PT services  PT Problem List         PT Treatment Interventions      PT Goals (Current goals can be found in the Care Plan section)  Acute Rehab PT Goals Patient Stated Goal: not stated PT Goal Formulation: (Pt unwilling  to participate.)    Frequency     Barriers to discharge        Co-evaluation               AM-PAC PT "6 Clicks" Mobility  Outcome Measure Help needed turning from your back to your side while in a flat bed without using bedrails?: None Help needed moving from lying on your back to sitting on the side of a flat bed without using bedrails?: None Help needed moving to and from a bed to a chair (including a wheelchair)?: None Help needed standing up from a chair using your arms (e.g., wheelchair or bedside chair)?: None Help needed to walk in hospital room?: None Help needed climbing 3-5 steps with a railing? : A Little 6 Click Score: 23    End of Session Equipment Utilized During Treatment: Oxygen(Pt initially on 2 L. Pt removed O2 to mobilize out of room.) Activity Tolerance: Treatment limited secondary to agitation Patient left: in bed;with call bell/phone within reach;with  nursing/sitter in room Nurse Communication: Mobility status PT Visit Diagnosis: Difficulty in walking, not elsewhere classified (R26.2)    Time: 5885-0277 PT Time Calculation (min) (ACUTE ONLY): 22 min   Charges:   PT Evaluation $PT Eval Moderate Complexity: 1 Mod          Lorrin Goodell, PT  Office # [phone] Pager [phone]   Lorriane Shire 02/18/2019, 10:48 AM

## 2019-02-19 ENCOUNTER — Telehealth: Payer: Self-pay | Admitting: *Deleted

## 2019-02-19 DIAGNOSIS — F250 Schizoaffective disorder, bipolar type: Secondary | ICD-10-CM

## 2019-02-19 LAB — GLUCOSE, CAPILLARY
Glucose-Capillary: 269 mg/dL — ABNORMAL HIGH (ref 70–99)
Glucose-Capillary: 275 mg/dL — ABNORMAL HIGH (ref 70–99)

## 2019-02-19 MED ORDER — ARIPIPRAZOLE 10 MG PO TABS: 10.0000 mg | ORAL_TABLET | Freq: Every day | ORAL | 0 refills | Status: DC

## 2019-02-19 MED ORDER — CEFDINIR 300 MG PO CAPS: 300.0000 mg | ORAL_CAPSULE | Freq: Two times a day (BID) | ORAL | 0 refills | Status: AC

## 2019-02-19 MED ORDER — QUETIAPINE FUMARATE 400 MG PO TABS: 400.0000 mg | ORAL_TABLET | Freq: Every day | ORAL | 0 refills | Status: DC

## 2019-02-19 MED ORDER — QUETIAPINE FUMARATE 200 MG PO TABS: 200.0000 mg | ORAL_TABLET | Freq: Two times a day (BID) | ORAL | 0 refills | Status: DC

## 2019-02-19 MED ORDER — SERTRALINE HCL 100 MG PO TABS: 100.0000 mg | ORAL_TABLET | Freq: Every day | ORAL | 0 refills | Status: DC

## 2019-02-19 MED ORDER — AZITHROMYCIN 250 MG PO TABS: ORAL_TABLET | ORAL | 0 refills | Status: DC

## 2019-02-19 MED FILL — CEFDINIR 300 MG CAPSULE: 300 | 3 days supply | Qty: 6 | Fill #0

## 2019-02-19 MED FILL — QUETIAPINE FUMARATE 400 MG: 400 | 30 days supply | Qty: 30 | Fill #0

## 2019-02-19 MED FILL — AZITHROMYCIN 250 MG TABLET: 250 | 3 days supply | Qty: 3 | Fill #0

## 2019-02-19 MED FILL — QUETIAPINE FUMARATE 200 MG: 200 | 30 days supply | Qty: 60 | Fill #0

## 2019-02-19 MED FILL — ARIPIPRAZOLE 10 MG TABS: 10 | 30 days supply | Qty: 30 | Fill #0

## 2019-02-19 MED FILL — SERTRALINE HCL 100 MG TAB: 100 | 30 days supply | Qty: 30 | Fill #0

## 2019-02-19 NOTE — Progress Notes (Signed)
Pts daughter Thayer Headings) called very upset that her mother was being discharged. Per daughter pt should be restrained, no phone privileges and cant come home til cured. Daughter also accused this RN of breaking HIPPA laws by, in her words "telling everything about her mother to Amy".  When I walked in the pts room pt was on phone with Amy asking for a ride home. Pt handed me the phone to confirmed she was being discharged. This RN spoke with Amy only saying" yes she is discharged". No other information was shared. Pt confirmed that is all that was said by this RN to Amy. Legrand Como, RN  and Research scientist (medical) (Camera operator) witnessed pt confirming this. Daughter continued to yell over the phone that she was told by a doctor that her mother would die if she came home. This RN text paged Dr Wyline Copas to call daughter. This RN explained to daughter that Dr Wyline Copas attempted to call her this morning with no answer. Daughter states no one called her. This RN confirmed phone number on file was correct with Daughter.  Daughter continue to yell over phone not wanting her mother discharged. This RN explained to her I could not legally hold her mother here once discharge is ordered by Dr and told daughter Dr has been paged. Pt has been calm and oriented through out this shift. This RN then handed phone to [employer], Agricultural consultant.

## 2019-02-19 NOTE — Telephone Encounter (Signed)
Transition Care Management Follow-up Telephone Call   Date discharged? 02/19/19   How have you been since you were released from the hospital? Pt states she is doing fine. just got home not too long ago   Do you understand why you were in the hospital? YES   Do you understand the discharge instructions? YES   Where were you discharged to? Home   Items Reviewed:  Medications reviewed: YES  Allergies reviewed: YES  Dietary changes reviewed: YES, carb modified  Referrals reviewed: No referral needed   Functional Questionnaire:   Activities of Daily Living (ADLs):   She states she are independent in the following: ambulation, bathing and hygiene, feeding, continence, grooming, toileting and dressing States she doesn't require assistance    Any transportation issues/concerns?: NO   Any patient concerns? NO   Confirmed importance and date/time of follow-up visits scheduled YES, appt 02/26/19   Provider Appointment booked with Dr. Sharlet Salina  Confirmed with patient if condition begins to worsen call PCP or go to the ER.  Patient was given the office number and encouraged to call back with question or concerns.  : YES

## 2019-02-19 NOTE — TOC Initial Note (Signed)
Transition of Care [facility]) - Initial/Assessment Note    Patient Details  Name: Susan [name] MRN: [id number] Date of Birth: 11/04/1960  Transition of Care [facility]) CM/SW Contact:    Zenon Mayo, RN Phone Number: 02/19/2019, 11:52 AM  Clinical Narrative:                 From home with daghter, she has cane and a walker at home,  She states she has no problem with getting her meds , she has transportation at dc.   Expected Discharge Plan: Home/Self Care Barriers to Discharge: No Barriers Identified   Patient Goals and CMS Choice Patient states their goals for this hospitalization and ongoing recovery are:: getting up every day and dressing herself and sitting on her lawn   Choice offered to / list presented to : NA  Expected Discharge Plan and Services Expected Discharge Plan: Home/Self Care In-house Referral: NA Discharge Planning Services: CM Consult Post Acute Care Choice: NA Living arrangements for the past 2 months: Single Family Home Expected Discharge Date: 02/19/19               DME Arranged: (NA)         HH Arranged: NA          Prior Living Arrangements/Services Living arrangements for the past 2 months: Single Family Home Lives with:: Adult Children Patient language and need for interpreter reviewed:: Yes Do you feel safe going back to the place where you live?: Yes      Need for Family Participation in Patient Care: No (Comment) Care giver support system in place?: No (comment)   Criminal Activity/Legal Involvement Pertinent to Current Situation/Hospitalization: No - Comment as needed  Activities of Daily Living      Permission Sought/Granted                  Emotional Assessment Appearance:: Appears stated age Attitude/Demeanor/Rapport: Engaged Affect (typically observed): Appropriate Orientation: : Oriented to Self, Oriented to Place, Oriented to  Time, Oriented to Situation Alcohol / Substance Use: Not Applicable    Admission  diagnosis:  Res. distress Patient Active Problem List   Diagnosis Date Noted  . Schizophrenia ([place]) 02/18/2019  . Multifocal pneumonia 02/17/2019  . Acute asthma exacerbation 02/17/2019  . DKA (diabetic ketoacidoses) ([place]) 02/17/2019  . AKI (acute kidney injury) ([place]) 02/17/2019  . Acute respiratory failure with hypoxia ([place]) 02/16/2019  . Bronchitis due to tobacco use 07/21/2018  . Need for prophylactic vaccination against Streptococcus pneumoniae (pneumococcus) 05/20/2018  . Right hip pain 02/21/2018  . Routine general medical examination at a health care facility 08/23/2017  . Weakness 06/24/2017  . Osteoarthritis 07/14/2016  . Abnormal hemoglobin ([place]) 04/18/2016  . Hot flashes 10/16/2015  . PTSD (post-traumatic stress disorder) 05/12/2015  . MDD (major depressive disorder), recurrent episode, moderate ([place]) 05/11/2015  . Cannabis use disorder, severe, dependence ([place]) 05/11/2015  . Tobacco use disorder 05/11/2015  . Diabetes mellitus type 2 without retinopathy ([place]) 01/28/2015  . Screening examination for venereal disease 06/02/2014  . Hereditary and idiopathic peripheral neuropathy 06/03/2013  . Dysfunctional uterine bleeding 06/04/2012  . Dyslipidemia   . GERD (gastroesophageal reflux disease)   . Schizoaffective disorder, bipolar type ([place]) 04/26/2011  . Morbid obesity ([place]) 04/26/2011  . HIV disease ([place]) 03/28/2011  . Hypertension 03/28/2011   PCP:  Hoyt Koch, MD Pharmacy:   [place], [place] [address] Phone:  [phone] Fax: [phone]  [facility] [place], [place] [address] Phone: [phone] Fax: [phone]  [place], [place] [address] [place] [address] Phone: [phone] Fax:  [phone]  [facility] [place] [place] - [address] [place] [address] Phone: [phone] Fax: [phone]     Social Determinants of Health (SDOH) Interventions    Readmission Risk Interventions No flowsheet data found.

## 2019-02-19 NOTE — Progress Notes (Signed)
This RN witnessed conversation with pt's daughter and Calvert Cantor as described in previous note. This RN asked to speak with pt's daughter in an attempt to understand situation and resolve issues. Daughter was very angry that pt was being discharged. Per my assessment, as well as bedside nurse, and attending provider, pt is AOx4, very calm and pleasant, pt is not in respiratory distress nor on oxygen. This was described to daughter who then expressed anger as to why her mother was not restrained and demanded we restrain her to prevent discharge. I explained to her that patient had been cleared by pych and her primary MD for discharge. Daughter then brought up how pt has pneumonia and needs hospitalized. This was addressed again stating the stability of the patient as well as the antibiotics the provider had prescribed at DC. Daughter then got very verbally abusive and yelling into phone to this RN. This RN once again explained that the pt is stable for dc, has dc orders, and has a safe ride home waiting for her at the door. The conversation was then terminated and patient was dc'd.

## 2019-02-19 NOTE — TOC Transition Note (Signed)
Transition of Care [facility]) - CM/SW Discharge Note   Patient Details  Name: Susan [name] MRN: [id number] Date of Birth: 1961-03-21  Transition of Care [facility]) CM/SW Contact:  Zenon Mayo, RN Phone Number: 02/19/2019, 11:58 AM   Clinical Narrative:    Patient for dc today, she has no problem getting her meds and she has transportation at dc.  No other needs.   Final next level of care: Home/Self Care Barriers to Discharge: No Barriers Identified   Patient Goals and CMS Choice Patient states their goals for this hospitalization and ongoing recovery are:: getting up everyday and dressing herself and sitting on her lawn   Choice offered to / list presented to : NA  Discharge Placement                       Discharge Plan and Services In-house Referral: NA Discharge Planning Services: CM Consult Post Acute Care Choice: NA          DME Arranged: (NA)         HH Arranged: NA          Social Determinants of Health (SDOH) Interventions     Readmission Risk Interventions Readmission Risk Prevention Plan 02/19/2019  Transportation Screening Complete  Medication Review Press photographer) Complete  PCP or Specialist appointment within 3-5 days of discharge Complete  HRI or Home Care Consult Complete  SW Recovery Care/Counseling Consult Complete  [place] Not Applicable  Some recent data might be hidden

## 2019-02-19 NOTE — Care Management Important Message (Signed)
Important Message  Patient Details  Name: Susan [name] MRN: [id number] Date of Birth: 01/31/1961   Medicare Important Message Given:  Yes    Norelle Runnion [address] 02/19/2019, 11:46 AM

## 2019-02-19 NOTE — Discharge Summary (Signed)
Physician Discharge Summary  Susan [name] CBS:[id number] DOB: 1960-11-12 DOA: 02/16/2019  PCP: Hoyt Koch, MD  Admit date: 02/16/2019 Discharge date: 02/19/2019  Admitted From: Home Disposition:  Home  Recommendations for Outpatient Follow-up:  1. Follow up with PCP in 1-2 weeks 2. Follow up with Psychiatry as scheduled  Discharge Condition:Improved CODE STATUS:Full Diet recommendation: Diabetic   Brief/Interim Summary: 58 y.o.femalewith medical history significant ofasthma, type 2 diabetes, GERD, HIV, hypertension, hyperlipidemia, TIA, prior history of substance abuse and conditions listed below presenting to the hospital via EMS for evaluation of respiratory distress.Patient is a poor historian and history provided by her islimited. Reports 1 day history of shortness of breath, wheezing, and productive cough. Denies any fevers, chills, or chest pain. Denies exposure to any individual with known COVID-19. States she has been using her asthma inhalers as prescribed. Does not seem to know what she takes for diabetes and is not giving me any details about her diet. No additional history could be obtained from her.  ED Course:SPO2 81% per EMS and patient was drowsy and tachypneic. She was placed on CPAP. Received Solu-Medrol 125 mg, Atrovent, and albuterol by EMS. Wheezing and bilateral coarse breath sounds on examination done by ED provider. Afebrile. Tachycardic and tachypneic on arrival to the ED. Blood pressure 148/81 on arrival. ABG with pH 7.30, PCO2 55, and PO2 117. Initially required 3.5 L supplemental oxygen and then 2 L supplemental oxygen vianasalcannulain the ED. COVID-19 rapid test negative. White count 13.6 with left shift. Hemoglobin 15.2. Platelet count 513,000, previously normal. Blood glucose 536. Bicarb 21, anion gap 19. BUN 42. Creatinine 1.4, baseline 0.7-0.8. BNP 103. UA pending. Chest x-ray showing diffuse bilateral hazy airspace  opacities suspicious for pulmonary edema versus atypical infectious process.In-house COVID-19 test negative x2.  Discharge Diagnoses:  Principal Problem:   Schizoaffective disorder, bipolar type ([place]) Active Problems:   MDD (major depressive disorder), recurrent episode, moderate (HCC)   Acute respiratory failure with hypoxia (HCC)   Multifocal pneumonia   Acute asthma exacerbation   DKA (diabetic ketoacidoses) (HCC)   AKI (acute kidney injury) ([place])   Schizophrenia ([place])  Acute hypoxic respiratory failure likely multifactorial secondary to multifocal community-acquired pneumonia versus acute asthma exacerbation Presented with O2 saturation 81% on room air corrected with 2 L of oxygen by nasal cannula to greater than 92% Continued on inhalers RVP negative COVID-19 in-house test negative x2 Bilateral pulm infiltrates noted Pt is continued on azithromycin and rocephin Successfully weaned O2 to room air Patient to complete course of omnicef and azithromycin on discharge  Sepsis secondary to multifocal community-acquired pneumonia Presented with leukocytosis, tachycardia and tachypnea Monitor fever curve and WBC Continued Rocephin and azithromycin as tolerated Weaned O2 to room air Complete azithromycin and omicef on discharge  Resolving DKA Pt presented with non-gap metabolic acidosis with gap of 19 and bicarb of 21 Serum chemistry with resolved metabolic acidosis Anion gap had closed Hemoglobin A1c 9.5 on 02/17/2019 To resume home regimen on d/c  Resolved AKI Baseline creatinine appears to be 0.9 with GFR of greater than 60 Presented with creatinine of 1.42 and GFR 48 Cr improved with IVF hydratrion  Peripheral edema IVF rate decreased 6/16  Thrombocytosis Platelet count 513,000, previously normal. Possibly due to hemoconcentration as white count and hemoglobin also slightly elevated.  HIV -Continue Biktarvy as tolerated  Hypertension -BP suboptimally  controlled.  -On clonidine. Cont to adjust bp meds as tolerated  Hyperlipidemia -Continue Crestor  Anxiety, depression, schizophrenia -Patient noted to be  markedly agitated and combative this AM towards all staff -Chart reviewed. Pt is well known to behavioral service in past -Patient is oriented to place, time, and has insight to her current diagnosis, however lacks decision making capacity and insight regarding her plan of care -Have already discussed with Psychiatry who will see patient in consultation -After discussion with psychiatry, given concern of lack of insight to plan of care, will involuntarily commit patient. Discussed with patient's daughter over phone who agrees with plan -Of note, per patient's daughter, patient had been without seroquel -Also per patient's daughter, patient reported to intermittently "not know where she is" prior to admit even when well -Patient was started on seroquel 200mg  bid with 400mg  qhs and abilify with marked improvement overnight  Morbid obesity -Recommend diet/lifestyle modification  Discharge Instructions   Allergies as of 02/19/2019      Reactions   Lyrica [pregabalin] Swelling   Has LE swelling      Medication List    STOP taking these medications   lidocaine 5 % Commonly known as: Lidoderm   Nucynta 100 MG Tabs Generic drug: Tapentadol HCl   potassium chloride SA 20 MEQ tablet Commonly known as: K-DUR     TAKE these medications   acetaminophen 650 MG CR tablet Commonly known as: TYLENOL Take 1,300 mg by mouth every 8 (eight) hours as needed for pain.   albuterol 108 (90 Base) MCG/ACT inhaler Commonly known as: VENTOLIN HFA Inhale 2 puffs into the lungs every 6 (six) hours as needed for wheezing or shortness of breath.   amLODipine 10 MG tablet Commonly known as: NORVASC Take 1 tablet (10 mg total) by mouth daily.   ARIPiprazole 10 MG tablet Commonly known as: ABILIFY Take 1 tablet (10 mg total) by mouth daily  for 30 days. Please defer to PCP or psychiatrist for additional refills Start taking on: February 20, 2019   azithromycin 250 MG tablet Commonly known as: Zithromax Z-Pak Please take 1 tab po daily x 3 more days then stop, zero refills   benztropine 0.5 MG tablet Commonly known as: COGENTIN Take 0.5 mg by mouth daily.   Biktarvy 50-200-25 MG Tabs tablet Generic drug: bictegravir-emtricitabine-tenofovir AF TAKE 1 TABLET BY MOUTH DAILY   busPIRone 15 MG tablet Commonly known as: BUSPAR Take 15 mg by mouth 3 (three) times daily.   cefdinir 300 MG capsule Commonly known as: OMNICEF Take 1 capsule (300 mg total) by mouth 2 (two) times daily for 3 days.   cloNIDine 0.2 MG tablet Commonly known as: CATAPRES Take 1 tablet (0.2 mg total) by mouth 2 (two) times daily.   doxepin 50 MG capsule Commonly known as: SINEQUAN Take 50 mg by mouth at bedtime.   enalapril 20 MG tablet Commonly known as: VASOTEC Take 1 tablet (20 mg total) by mouth daily.   gabapentin 300 MG capsule Commonly known as: NEURONTIN TAKE 2 CAPSULES BY MOUTH FOUR TIMES A DAY What changed: See the new instructions.   glucose blood test strip Commonly known as: OneTouch Verio 1 each by Other route 2 (two) times daily. And lancets 2/day   Insulin NPH (Human) (Isophane) 100 UNIT/ML Kiwkpen Commonly known as: HumuLIN N KwikPen Inject 130 Units into the skin every morning. What changed:   how much to take  when to take this   metFORMIN 500 MG tablet Commonly known as: GLUCOPHAGE Take 2 tablets (1,000 mg total) by mouth 2 (two) times daily with a meal.   omeprazole 20 MG capsule Commonly  known as: PRILOSEC Take 1 capsule (20 mg total) by mouth daily.   Pen Needles 5/16" 30G X 8 MM Misc 1 each by Does not apply route daily.   QUEtiapine 200 MG tablet Commonly known as: SEROQUEL Take 1 tablet (200 mg total) by mouth 2 (two) times daily for 30 days. Please defer to PCP or psychiatrist for refills What  changed:   how much to take  how to take this  when to take this  additional instructions   QUEtiapine 400 MG tablet Commonly known as: SEROQUEL Take 1 tablet (400 mg total) by mouth at bedtime for 30 days. Please defer to PCP or psychiatrist for refills What changed: You were already taking a medication with the same name, and this prescription was added. Make sure you understand how and when to take each.   rosuvastatin 20 MG tablet Commonly known as: CRESTOR Take 1 tablet (20 mg total) by mouth daily.   sertraline 100 MG tablet Commonly known as: ZOLOFT Take 1 tablet (100 mg total) by mouth daily for 30 days. Please defer to PCP or psychiatrist for refills Start taking on: February 20, 2019 What changed:   medication strength  how much to take  additional instructions   triamterene-hydrochlorothiazide 37.5-25 MG tablet Commonly known as: MAXZIDE-25 Take 1 tablet by mouth daily.   umeclidinium-vilanterol 62.5-25 MCG/INH Aepb Commonly known as: Anoro Ellipta Inhale 1 puff into the lungs daily. What changed:   when to take this  reasons to take this      Follow-up Information    Hoyt Koch, MD. Schedule an appointment as soon as possible for a visit in 2 week(s).   Specialty: Internal Medicine Contact information: [place] [address] [phone]        Thayer Headings, MD .   Specialty: Infectious Diseases Contact information: [address] [address] [phone]        Please follow up with [facility] within one week Follow up.          Allergies  Allergen Reactions  . Lyrica [Pregabalin] Swelling    Has LE swelling    Consultations:  Psychiatry  Procedures/Studies: Ct Angio Chest Pe W Or Wo Contrast  Result Date: 02/17/2019 CLINICAL DATA:  Respiratory distress.  Productive cough. EXAM: CT ANGIOGRAPHY CHEST WITH CONTRAST TECHNIQUE: Multidetector CT imaging of the chest was performed  using the standard protocol during bolus administration of intravenous contrast. Multiplanar CT image reconstructions and MIPs were obtained to evaluate the vascular anatomy. CONTRAST:  18mL OMNIPAQUE IOHEXOL 350 MG/ML SOLN COMPARISON:  One-view chest x-ray 02/16/2019 FINDINGS: Cardiovascular: Heart size is normal. Coronary artery calcifications are present. Atherosclerotic changes are noted at the aorta. Great vessel origins are within normal limits. There is no aneurysm. Pulmonary artery opacification is satisfactory. The study is moderately degraded by patient motion. No significant filling defect is present to suggest pulmonary embolus. Mediastinum/Nodes: Subcentimeter prevascular and subcarinal nodes are present. No significant hilar or axillary adenopathy is present. Lungs/Pleura: Extensive patchy bilateral airspace disease is present bilaterally. This involves the upper and lower lobes. Airways are patent. There is no pneumothorax or significant pleural effusion. No necrosis is present. Upper Abdomen: Stomach is moderately distended. Limited imaging of the abdomen is otherwise unremarkable. Musculoskeletal: Fused anterior osteophytes in the thoracic spine are consistent with DISH. Vertebral body heights alignment are maintained. No focal lytic or blastic lesions are present. Ribs are unremarkable. Review of the MIP images confirms the  above findings. IMPRESSION: 1. Diffuse bilateral patchy airspace disease consistent with multi lobar pneumonia. 2. No pulmonary embolus. 3.  Aortic Atherosclerosis (ICD10-I70.0). Electronically Signed   By: San Morelle M.D.   On: 02/17/2019 05:50   Dg Chest Portable 1 View  Result Date: 02/16/2019 CLINICAL DATA:  Dyspnea and wheezing. EXAM: PORTABLE CHEST 1 VIEW COMPARISON:  12/02/2018 FINDINGS: The heart size is enlarged. There are diffuse bilateral hazy airspace opacities. No pneumothorax. No large pleural effusion. There is no acute osseous abnormality.  IMPRESSION: Diffuse bilateral hazy airspace opacities which can be seen in patients with pulmonary edema or an atypical infectious process such as viral pneumonia. Electronically Signed   By: Constance Holster M.D.   On: 02/16/2019 19:01     Subjective: Eager to go home  Discharge Exam: Vitals:   02/19/19 0726 02/19/19 0727  BP: (!) 159/98   Pulse: 93   Resp: 18   Temp: 98.3 F (36.8 C)   SpO2: 95% (!) 88%   Vitals:   02/18/19 2335 02/19/19 0352 02/19/19 0726 02/19/19 0727  BP: (!) 183/98 (!) 160/91 (!) 159/98   Pulse: 91 87 93   Resp: 16 (!) 23 18   Temp: 98.3 F (36.8 C) 98.5 F (36.9 C) 98.3 F (36.8 C)   TempSrc: Oral Oral Oral   SpO2: 91% (!) 23% 95% (!) 88%  Weight:      Height:        General: Pt is alert, awake, not in acute distress Cardiovascular: RRR, S1/S2 +, no rubs, no gallops Respiratory: CTA bilaterally, no wheezing, no rhonchi Abdominal: Soft, NT, ND, bowel sounds + Extremities: no edema, no cyanosis   The results of significant diagnostics from this hospitalization (including imaging, microbiology, ancillary and laboratory) are listed below for reference.     Microbiology: Recent Results (from the past 240 hour(s))  SARS Coronavirus 2     Status: None   Collection Time: 02/16/19  6:09 PM  Result Value Ref Range Status   SARS Coronavirus 2 NOT DETECTED NOT DETECTED Final    Comment: (NOTE) SARS-CoV-2 target nucleic acids are NOT DETECTED. The SARS-CoV-2 RNA is generally detectable in upper and lower respiratory specimens during the acute phase of infection.  Negative  results do not preclude SARS-CoV-2 infection, do not rule out co-infections with other pathogens, and should not be used as the sole basis for treatment or other patient management decisions.  Negative results must be combined with clinical observations, patient history, and epidemiological information. The expected result is Not Detected. Fact Sheet for  Patients: http://www.biofiredefense.com/wp-content/uploads/2020/03/BIOFIRE-COVID -19-patients.pdf Fact Sheet for Healthcare Providers: http://www.biofiredefense.com/wp-content/uploads/2020/03/BIOFIRE-COVID -19-hcp.pdf This test is not yet approved or cleared by the [place] and  has been authorized for detection and/or diagnosis of SARS-CoV-2 by FDA under an Emergency Use Authorization (EUA).  This EUA will remain in effec t (meaning this test can be used) for the duration of  the COVID-19 declaration under Section 564(b)(1) of the Act, 21 U.S.C. section 360bbb-3(b)(1), unless the authorization is terminated or revoked sooner. Performed at [facility], [place] [address], [place], [place] [address]   SARS Coronavirus 2     Status: None   Collection Time: 02/16/19 11:16 PM  Result Value Ref Range Status   SARS Coronavirus 2 NOT DETECTED NOT DETECTED Final    Comment: (NOTE) SARS-CoV-2 target nucleic acids are NOT DETECTED. The SARS-CoV-2 RNA is generally detectable in upper and lower respiratory specimens during the acute phase of infection.  Negative  results do not preclude SARS-CoV-2 infection, do not rule out co-infections with other pathogens, and should not be used as the sole basis for treatment or other patient management decisions.  Negative results must be combined with clinical observations, patient history, and epidemiological information. The expected result is Not Detected. Fact Sheet for Patients: http://www.biofiredefense.com/wp-content/uploads/2020/03/BIOFIRE-COVID -19-patients.pdf Fact Sheet for Healthcare Providers: http://www.biofiredefense.com/wp-content/uploads/2020/03/BIOFIRE-COVID -19-hcp.pdf This test is not yet approved or cleared by the [place] and  has been authorized for detection and/or diagnosis of SARS-CoV-2 by FDA under an Emergency Use Authorization (EUA).  This EUA will remain in effec t (meaning this test can be used)  for the duration of  the COVID-19 declaration under Section 564(b)(1) of the Act, 21 U.S.C. section 360bbb-3(b)(1), unless the authorization is terminated or revoked sooner. Performed at [facility], [place] [address] [place], [place] [address]   Respiratory Panel by PCR     Status: None   Collection Time: 02/17/19  3:19 AM   Specimen: Nasopharyngeal Swab; Respiratory  Result Value Ref Range Status   Adenovirus NOT DETECTED NOT DETECTED Final   Coronavirus 229E NOT DETECTED NOT DETECTED Final    Comment: (NOTE) The Coronavirus on the Respiratory Panel, DOES NOT test for the novel  Coronavirus (2019 nCoV)    Coronavirus HKU1 NOT DETECTED NOT DETECTED Final   Coronavirus NL63 NOT DETECTED NOT DETECTED Final   Coronavirus OC43 NOT DETECTED NOT DETECTED Final   Metapneumovirus NOT DETECTED NOT DETECTED Final   Rhinovirus / Enterovirus NOT DETECTED NOT DETECTED Final   Influenza A NOT DETECTED NOT DETECTED Final   Influenza B NOT DETECTED NOT DETECTED Final   Parainfluenza Virus 1 NOT DETECTED NOT DETECTED Final   Parainfluenza Virus 2 NOT DETECTED NOT DETECTED Final   Parainfluenza Virus 3 NOT DETECTED NOT DETECTED Final   Parainfluenza Virus 4 NOT DETECTED NOT DETECTED Final   Respiratory Syncytial Virus NOT DETECTED NOT DETECTED Final   Bordetella pertussis NOT DETECTED NOT DETECTED Final   Chlamydophila pneumoniae NOT DETECTED NOT DETECTED Final   Mycoplasma pneumoniae NOT DETECTED NOT DETECTED Final    Comment: Performed at [facility], [place]. [address] [place], [place] [address]  MRSA PCR Screening     Status: None   Collection Time: 02/17/19  4:54 AM   Specimen: Nasal Mucosa; Nasopharyngeal  Result Value Ref Range Status   MRSA by PCR NEGATIVE NEGATIVE Final    Comment:        The GeneXpert MRSA Assay (FDA approved for NASAL specimens only), is one component of a comprehensive MRSA colonization surveillance program. It is not intended to diagnose  MRSA infection nor to guide or monitor treatment for MRSA infections. Performed at [facility], [place] [address], [place], [place] [address]      Labs: BNP (last 3 results) Recent Labs    02/16/19 1853  BNP 034.7*   Basic Metabolic Panel: Recent Labs  Lab 02/16/19 2232 02/17/19 0309 02/17/19 0646 02/17/19 2208 02/18/19 0758  NA 131* 133* 135 130* 132*  K 4.3 3.3* 3.3* 4.3 4.2  CL 91* 96* 98 95* 96*  CO2 18* 25 26 21* 24  GLUCOSE 532* 303* 189* 322* 330*  BUN 42* 40* 35* 36* 30*  CREATININE 1.50* 1.17* 0.99 0.95 1.02*  CALCIUM 8.1* 8.1* 8.2* 7.9* 8.1*  MG  --   --  2.9*  --   --    Liver Function Tests: No results for input(s): AST, ALT, ALKPHOS, BILITOT,  PROT, ALBUMIN in the last 168 hours. No results for input(s): LIPASE, AMYLASE in the last 168 hours. No results for input(s): AMMONIA in the last 168 hours. CBC: Recent Labs  Lab 02/16/19 1831 02/16/19 1853 02/17/19 0646  WBC  --  13.6* 12.6*  NEUTROABS  --  9.9* 9.9*  HGB 16.7* 15.2* 14.0  HCT 49.0* 46.1* 42.6  MCV  --  86.8 84.9  PLT  --  513* 526*   Cardiac Enzymes: Recent Labs  Lab 02/16/19 2232  TROPONINI <0.03   BNP: Invalid input(s): POCBNP CBG: Recent Labs  Lab 02/18/19 0801 02/18/19 1200 02/18/19 1700 02/18/19 2122 02/19/19 0728  GLUCAP 310* 374* 295* 301* 269*   D-Dimer Recent Labs    02/16/19 2232  DDIMER 6.33*   Hgb A1c Recent Labs    02/17/19 0309  HGBA1C 9.5*   Lipid Profile No results for input(s): CHOL, HDL, LDLCALC, TRIG, CHOLHDL, LDLDIRECT in the last 72 hours. Thyroid function studies No results for input(s): TSH, T4TOTAL, T3FREE, THYROIDAB in the last 72 hours.  Invalid input(s): FREET3 Anemia work up Recent Labs    02/16/19 2232  FERRITIN 602*   Urinalysis    Component Value Date/Time   COLORURINE YELLOW 02/17/2019 0708   APPEARANCEUR HAZY (A) 02/17/2019 0708   LABSPEC 1.026 02/17/2019 0708   PHURINE 5.0 02/17/2019 0708   GLUCOSEU >=500 (A)  02/17/2019 0708   HGBUR NEGATIVE 02/17/2019 0708   BILIRUBINUR NEGATIVE 02/17/2019 0708   KETONESUR 5 (A) 02/17/2019 0708   PROTEINUR 30 (A) 02/17/2019 0708   UROBILINOGEN 1.0 06/01/2015 0104   NITRITE NEGATIVE 02/17/2019 0708   LEUKOCYTESUR NEGATIVE 02/17/2019 0708   Sepsis Labs Invalid input(s): PROCALCITONIN,  WBC,  LACTICIDVEN Microbiology Recent Results (from the past 240 hour(s))  SARS Coronavirus 2     Status: None   Collection Time: 02/16/19  6:09 PM  Result Value Ref Range Status   SARS Coronavirus 2 NOT DETECTED NOT DETECTED Final    Comment: (NOTE) SARS-CoV-2 target nucleic acids are NOT DETECTED. The SARS-CoV-2 RNA is generally detectable in upper and lower respiratory specimens during the acute phase of infection.  Negative  results do not preclude SARS-CoV-2 infection, do not rule out co-infections with other pathogens, and should not be used as the sole basis for treatment or other patient management decisions.  Negative results must be combined with clinical observations, patient history, and epidemiological information. The expected result is Not Detected. Fact Sheet for Patients: http://www.biofiredefense.com/wp-content/uploads/2020/03/BIOFIRE-COVID -19-patients.pdf Fact Sheet for Healthcare Providers: http://www.biofiredefense.com/wp-content/uploads/2020/03/BIOFIRE-COVID -19-hcp.pdf This test is not yet approved or cleared by the [place] and  has been authorized for detection and/or diagnosis of SARS-CoV-2 by FDA under an Emergency Use Authorization (EUA).  This EUA will remain in effec t (meaning this test can be used) for the duration of  the COVID-19 declaration under Section 564(b)(1) of the Act, 21 U.S.C. section 360bbb-3(b)(1), unless the authorization is terminated or revoked sooner. Performed at [facility], [place] [address], [place], [place] [address]   SARS Coronavirus 2     Status: None   Collection Time: 02/16/19 11:16 PM   Result Value Ref Range Status   SARS Coronavirus 2 NOT DETECTED NOT DETECTED Final    Comment: (NOTE) SARS-CoV-2 target nucleic acids are NOT DETECTED. The SARS-CoV-2 RNA is generally detectable in upper and lower respiratory specimens during the acute phase of infection.  Negative  results do not preclude SARS-CoV-2 infection, do not rule out co-infections with other pathogens, and  should not be used as the sole basis for treatment or other patient management decisions.  Negative results must be combined with clinical observations, patient history, and epidemiological information. The expected result is Not Detected. Fact Sheet for Patients: http://www.biofiredefense.com/wp-content/uploads/2020/03/BIOFIRE-COVID -19-patients.pdf Fact Sheet for Healthcare Providers: http://www.biofiredefense.com/wp-content/uploads/2020/03/BIOFIRE-COVID -19-hcp.pdf This test is not yet approved or cleared by the [place] and  has been authorized for detection and/or diagnosis of SARS-CoV-2 by FDA under an Emergency Use Authorization (EUA).  This EUA will remain in effec t (meaning this test can be used) for the duration of  the COVID-19 declaration under Section 564(b)(1) of the Act, 21 U.S.C. section 360bbb-3(b)(1), unless the authorization is terminated or revoked sooner. Performed at [facility], [place] [address] [place], [place] [address]   Respiratory Panel by PCR     Status: None   Collection Time: 02/17/19  3:19 AM   Specimen: Nasopharyngeal Swab; Respiratory  Result Value Ref Range Status   Adenovirus NOT DETECTED NOT DETECTED Final   Coronavirus 229E NOT DETECTED NOT DETECTED Final    Comment: (NOTE) The Coronavirus on the Respiratory Panel, DOES NOT test for the novel  Coronavirus (2019 nCoV)    Coronavirus HKU1 NOT DETECTED NOT DETECTED Final   Coronavirus NL63 NOT DETECTED NOT DETECTED Final   Coronavirus OC43 NOT DETECTED NOT DETECTED Final   Metapneumovirus NOT  DETECTED NOT DETECTED Final   Rhinovirus / Enterovirus NOT DETECTED NOT DETECTED Final   Influenza A NOT DETECTED NOT DETECTED Final   Influenza B NOT DETECTED NOT DETECTED Final   Parainfluenza Virus 1 NOT DETECTED NOT DETECTED Final   Parainfluenza Virus 2 NOT DETECTED NOT DETECTED Final   Parainfluenza Virus 3 NOT DETECTED NOT DETECTED Final   Parainfluenza Virus 4 NOT DETECTED NOT DETECTED Final   Respiratory Syncytial Virus NOT DETECTED NOT DETECTED Final   Bordetella pertussis NOT DETECTED NOT DETECTED Final   Chlamydophila pneumoniae NOT DETECTED NOT DETECTED Final   Mycoplasma pneumoniae NOT DETECTED NOT DETECTED Final    Comment: Performed at [facility], [place]. [address] [place], [place] [address]  MRSA PCR Screening     Status: None   Collection Time: 02/17/19  4:54 AM   Specimen: Nasal Mucosa; Nasopharyngeal  Result Value Ref Range Status   MRSA by PCR NEGATIVE NEGATIVE Final    Comment:        The GeneXpert MRSA Assay (FDA approved for NASAL specimens only), is one component of a comprehensive MRSA colonization surveillance program. It is not intended to diagnose MRSA infection nor to guide or monitor treatment for MRSA infections. Performed at [facility], [place] [facility] [address], [place], [place] [address]    Time spent: 30 min  SIGNED:   Marylu Lund, MD  [facility] 02/19/2019, 11:26 AM  If 7PM-7AM, please contact night-coverage

## 2019-02-23 LAB — CULTURE, BLOOD (ROUTINE X 2)
Culture: NO GROWTH
Culture: NO GROWTH
Special Requests: ADEQUATE
Special Requests: ADEQUATE

## 2019-02-26 ENCOUNTER — Ambulatory Visit (INDEPENDENT_AMBULATORY_CARE_PROVIDER_SITE_OTHER): Payer: Medicare Other | Admitting: Internal Medicine

## 2019-02-26 ENCOUNTER — Encounter: Payer: Self-pay | Admitting: Internal Medicine

## 2019-02-26 DIAGNOSIS — J9601 Acute respiratory failure with hypoxia: Secondary | ICD-10-CM

## 2019-02-26 DIAGNOSIS — E119 Type 2 diabetes mellitus without complications: Secondary | ICD-10-CM

## 2019-02-26 DIAGNOSIS — F209 Schizophrenia, unspecified: Secondary | ICD-10-CM

## 2019-02-26 DIAGNOSIS — K5900 Constipation, unspecified: Secondary | ICD-10-CM | POA: Diagnosis not present

## 2019-02-26 DIAGNOSIS — J189 Pneumonia, unspecified organism: Secondary | ICD-10-CM

## 2019-02-26 MED ORDER — GABAPENTIN 300 MG PO CAPS: 900.0000 mg | ORAL_CAPSULE | Freq: Three times a day (TID) | ORAL | 6 refills | Status: DC

## 2019-02-26 NOTE — Progress Notes (Signed)
Virtual Visit via Audio Note  I connected with Susan [name] on 02/26/19 at 11:20 AM EDT by an audio-only enabled telemedicine application and verified that I am speaking with the correct person using two identifiers.  The patient and the provider were at separate locations throughout the entire encounter.   I discussed the limitations of evaluation and management by telemedicine and the availability of in person appointments. The patient expressed understanding and agreed to proceed.  History of Present Illness: The patient is a 57 y.o. female with visit for hospital follow up (in for CAP and with hypoxia, negative for covid-19, had some psych problems due to being out of meds prior to hospital stay, she did have good recovery on her routine meds). Started azithromycin and keflex and finished those a couple days ago. She is overall improving. Breathing is doing well. Does still have cough but this is improving some. Does have constipation since leaving the hospital. Has not tried anything for this. Denies blood in stool. Denies nausea or vomiting. Still passing gas. She denies hallucinations. Denies chest pains. Is having some problems with her gabapentin dosing and feels that it is not correct. She used to take 3 pills TID and recently the dosing is 2 pills QID. Denies new cough or SOB. Overall it is improving gradually. Still fatigued. Appetite is slowly returning. She feels she needs home health again as this was helpful before when she did this. Not able to leave house independently and her daughter helps with some of the IADLs.   PMH, [place], social history reviewed and updated  Observations/Objective: Voice strong, no cough during visit, no dyspnea, A and O times 3  Assessment and Plan: See problem oriented charting  Follow Up Instructions: start stool softener for constipation  Visit time 22 minutes: that time was spent in face to face counseling and coordination of care with the patient:  counseled about as above  I discussed the assessment and treatment plan with the patient. The patient was provided an opportunity to ask questions and all were answered. The patient agreed with the plan and demonstrated an understanding of the instructions.   The patient was advised to call back or seek an in-person evaluation if the symptoms worsen or if the condition fails to improve as anticipated.  Hoyt Koch, MD

## 2019-02-26 NOTE — Assessment & Plan Note (Addendum)
Improving and she is getting around but not well at home. Home health ordered today for nursing and PT. Needs CXR in about 1 month.

## 2019-02-26 NOTE — Patient Instructions (Signed)
We will get home health out to you.  We want you to come for an x-ray of the chest at our office [address] on or after August 1st. The x-ray department is on the basement floor of our office. Make sure to wear a mask while in our building. They are open M-F from 8-5. You do not need an appointment.

## 2019-02-26 NOTE — Assessment & Plan Note (Signed)
Off meds due to not having them prior to hospital stay and is now back on meds and controlled. She will continue with [facility] for monitoring and meds.

## 2019-02-26 NOTE — Assessment & Plan Note (Signed)
CXR needed in 1 month for clearance. She is a smoker and is unable to quit at this time although this is counseled today.

## 2019-02-26 NOTE — Assessment & Plan Note (Addendum)
She has stool softeners at home and she is advised to start these to help with temporary constipation. No signs of obstruction.

## 2019-02-26 NOTE — Assessment & Plan Note (Signed)
Sugars high in the hospital and she is not checking since being home although diet is back to normal and infection seems to be clearing.

## 2019-03-02 ENCOUNTER — Encounter: Payer: Medicare Other | Admitting: Internal Medicine

## 2019-03-11 ENCOUNTER — Telehealth: Payer: Self-pay

## 2019-03-11 NOTE — Telephone Encounter (Signed)
As far as I am aware she does not have parkinson's disease. She got home health ordered after last visit on 02/26/19, have they not come out yet?

## 2019-03-11 NOTE — Telephone Encounter (Signed)
Copied from [place] [id number]. Topic: General - Other >> Mar 11, 2019  8:00 AM Keene Breath wrote: Reason for CRM: Patient called to request that the nurse call her back regarding nursing at home due to her diagnosis of Parkinson's disease.  Patient stated that she does have a video phone and would like to get set up with care as soon as possible.  CB# [phone]

## 2019-03-11 NOTE — Telephone Encounter (Signed)
Would patient need to make an appointment to discuss this?

## 2019-03-11 NOTE — Telephone Encounter (Signed)
Tried calling patient and mailbox was full. Please give MD response when patient calls back. Thank you

## 2019-03-30 ENCOUNTER — Telehealth: Payer: Self-pay | Admitting: Internal Medicine

## 2019-03-30 NOTE — Telephone Encounter (Signed)
Pt would like to schedule in office for gout.  Please advise.

## 2019-03-30 NOTE — Telephone Encounter (Signed)
As long as symptom free can come in

## 2019-03-31 NOTE — Telephone Encounter (Signed)
Left vm to call back to schedule  °

## 2019-04-03 ENCOUNTER — Other Ambulatory Visit: Payer: Self-pay

## 2019-04-03 NOTE — Patient Outreach (Signed)
Susan [name]) Care Management  04/03/2019  Susan [name] 1960/11/16 [id number]   Medication Adherence call to Susan [name] Telephone call to Patient regarding Medication Adherence unable to reach patient. [name] is showing past due on Enalepril 20 mg and Rosuvastatin 20 mg under [place].  [facility] Management Direct Dial [phone]  Fax [phone] [email or web address]

## 2019-04-07 ENCOUNTER — Other Ambulatory Visit: Payer: Self-pay

## 2019-04-07 NOTE — Patient Outreach (Signed)
[facility]) Care Management  04/07/2019  Susan [name] 03-20-1961 [id number]  Medication Adherence call to Susan [name] HIPPA Compliant Voice message left with a call back number. Susan [name] is showing past due on Enalepril 20 mg and Rosuvastatin 20 mg under [place].   [facility] Management Direct Dial [phone]  Fax [phone] [name]

## 2019-05-26 ENCOUNTER — Telehealth: Payer: Self-pay

## 2019-05-26 ENCOUNTER — Other Ambulatory Visit: Payer: Self-pay | Admitting: Internal Medicine

## 2019-05-26 DIAGNOSIS — B20 Human immunodeficiency virus [HIV] disease: Secondary | ICD-10-CM

## 2019-05-26 NOTE — Telephone Encounter (Signed)
Attempted to call patient to schedule overdue appointment after receiving refill request for [facility]. Patient has not been in care since last September and no showed last appointment.  Unable to leave voicemail at this time; will send in 30 day supply of medication. Patient will need be seen in office for additional refills. [place]

## 2019-06-09 ENCOUNTER — Encounter: Payer: Self-pay | Admitting: Internal Medicine

## 2019-06-09 ENCOUNTER — Ambulatory Visit (INDEPENDENT_AMBULATORY_CARE_PROVIDER_SITE_OTHER): Payer: Medicare Other | Admitting: Internal Medicine

## 2019-06-09 DIAGNOSIS — E119 Type 2 diabetes mellitus without complications: Secondary | ICD-10-CM | POA: Diagnosis not present

## 2019-06-09 DIAGNOSIS — G609 Hereditary and idiopathic neuropathy, unspecified: Secondary | ICD-10-CM

## 2019-06-09 MED ORDER — GABAPENTIN 400 MG PO CAPS: 1200.0000 mg | ORAL_CAPSULE | Freq: Three times a day (TID) | ORAL | 6 refills | Status: DC

## 2019-06-09 NOTE — Assessment & Plan Note (Signed)
Needs meals on wheels and walker with seat. Increase gabapentin to help with neuropathy.

## 2019-06-09 NOTE — Progress Notes (Signed)
Virtual Visit via Audio Note  I connected with Susan [name] on 06/09/19 at  2:00 PM EDT by an audio-only enabled telemedicine application and verified that I am speaking with the correct person using two identifiers.  The patient and the provider were at separate locations throughout the entire encounter.   I discussed the limitations of evaluation and management by telemedicine and the availability of in person appointments. The patient expressed understanding and agreed to proceed.  History of Present Illness: The patient is a 58 y.o. female with visit for frequent falls. She has been confined to bed due to weakness and neuropathy pain. She has nursing from [facility] but this is not enough. She is scared to move around and has been using diapers and not leaving bed. She has a shower chair and uses this with her children present only. She has a cane but this does not provide enough stability and she has fallen several times. Started years ago and worsening lately.   Observations/Objective: Voice strong, no coughing or dyspnea  Assessment and Plan: See problem oriented charting  Follow Up Instructions: walker with seat, meals on wheels referral due to mental health needs assistance filling out forms, increase gabapentin dosing  Visit time 12 minutes: that time was spent in non-face to face counseling and coordination of care with the patient: counseled about as above  I discussed the assessment and treatment plan with the patient. The patient was provided an opportunity to ask questions and all were answered. The patient agreed with the plan and demonstrated an understanding of the instructions.   The patient was advised to call back or seek an in-person evaluation if the symptoms worsen or if the condition fails to improve as anticipated.  Hoyt Koch, MD

## 2019-06-09 NOTE — Assessment & Plan Note (Signed)
Increase gabapentin to 1200 mg TID and this is the maximum allowed dosing. She understands this. Rx for walker with seat for stability.

## 2019-06-12 ENCOUNTER — Telehealth: Payer: Self-pay

## 2019-06-12 NOTE — Telephone Encounter (Signed)
Copied from [place] [id number]. Topic: Referral - Status >> Jun 12, 2019  [id number] PM Simone Curia D wrote: [id number] Patient is not eligible for MOW due to age requirement of 49. Patient received SNAP benefits and they now have online order and delivery. Patient is aware of this feature and utilizes it.  Gave information for Medicaid home delivered meals this may be an option and gave information for Medicaid In-home aide. Ambrose Mantle [phone]

## 2019-06-21 ENCOUNTER — Other Ambulatory Visit: Payer: Self-pay | Admitting: Internal Medicine

## 2019-06-21 DIAGNOSIS — B20 Human immunodeficiency virus [HIV] disease: Secondary | ICD-10-CM

## 2019-06-22 ENCOUNTER — Other Ambulatory Visit: Payer: Self-pay

## 2019-06-22 DIAGNOSIS — B20 Human immunodeficiency virus [HIV] disease: Secondary | ICD-10-CM

## 2019-06-22 MED ORDER — BIKTARVY 50-200-25 MG PO TABS: 1.0000 | ORAL_TABLET | Freq: Every day | ORAL | 0 refills | Status: DC

## 2019-06-24 DIAGNOSIS — J189 Pneumonia, unspecified organism: Secondary | ICD-10-CM | POA: Diagnosis not present

## 2019-06-24 DIAGNOSIS — R296 Repeated falls: Secondary | ICD-10-CM | POA: Diagnosis not present

## 2019-06-24 DIAGNOSIS — E1110 Type 2 diabetes mellitus with ketoacidosis without coma: Secondary | ICD-10-CM | POA: Diagnosis not present

## 2019-06-24 DIAGNOSIS — J45901 Unspecified asthma with (acute) exacerbation: Secondary | ICD-10-CM | POA: Diagnosis not present

## 2019-06-25 ENCOUNTER — Telehealth: Payer: Self-pay

## 2019-06-25 NOTE — Telephone Encounter (Signed)
Copied from [place] [id number]. Topic: Referral - Status >> Jun 25, 2019  123456 PM Simone Curia D wrote: [id number] Spoke with patient she was able to contact Medicaid home delivered meals. She has left a message and is awaiting a return call. Ambrose Mantle [phone]

## 2019-06-29 ENCOUNTER — Other Ambulatory Visit: Payer: Self-pay

## 2019-06-29 ENCOUNTER — Other Ambulatory Visit: Payer: Medicare Other

## 2019-06-29 DIAGNOSIS — B20 Human immunodeficiency virus [HIV] disease: Secondary | ICD-10-CM

## 2019-06-29 DIAGNOSIS — Z79899 Other long term (current) drug therapy: Secondary | ICD-10-CM

## 2019-06-29 DIAGNOSIS — Z113 Encounter for screening for infections with a predominantly sexual mode of transmission: Secondary | ICD-10-CM

## 2019-06-30 LAB — T-HELPER CELL (CD4) - (RCID CLINIC ONLY)
CD4 % Helper T Cell: 28 % — ABNORMAL LOW (ref 33–65)
CD4 T Cell Abs: 860 /uL (ref 400–1790)

## 2019-07-02 LAB — CBC WITH DIFFERENTIAL/PLATELET
Absolute Monocytes: 482 cells/uL (ref 200–950)
Basophils Absolute: 67 cells/uL (ref 0–200)
Basophils Relative: 1 %
Eosinophils Absolute: 268 cells/uL (ref 15–500)
Eosinophils Relative: 4 %
HCT: 47.3 % — ABNORMAL HIGH (ref 35.0–45.0)
Hemoglobin: 15.5 g/dL (ref 11.7–15.5)
Lymphs Abs: 3323 cells/uL (ref 850–3900)
MCH: 27.1 pg (ref 27.0–33.0)
MCHC: 32.8 g/dL (ref 32.0–36.0)
MCV: 82.5 fL (ref 80.0–100.0)
MPV: 8.9 fL (ref 7.5–12.5)
Monocytes Relative: 7.2 %
Neutro Abs: 2559 cells/uL (ref 1500–7800)
Neutrophils Relative %: 38.2 %
Platelets: 309 10*3/uL (ref 140–400)
RBC: 5.73 10*6/uL — ABNORMAL HIGH (ref 3.80–5.10)
RDW: 15.5 % — ABNORMAL HIGH (ref 11.0–15.0)
Total Lymphocyte: 49.6 %
WBC: 6.7 10*3/uL (ref 3.8–10.8)

## 2019-07-02 LAB — COMPREHENSIVE METABOLIC PANEL
AG Ratio: 1.2 (calc) (ref 1.0–2.5)
ALT: 8 U/L (ref 6–29)
AST: 11 U/L (ref 10–35)
Albumin: 3.5 g/dL — ABNORMAL LOW (ref 3.6–5.1)
Alkaline phosphatase (APISO): 111 U/L (ref 37–153)
BUN: 7 mg/dL (ref 7–25)
CO2: 25 mmol/L (ref 20–32)
Calcium: 8.8 mg/dL (ref 8.6–10.4)
Chloride: 104 mmol/L (ref 98–110)
Creat: 0.78 mg/dL (ref 0.50–1.05)
Globulin: 3 g/dL (calc) (ref 1.9–3.7)
Glucose, Bld: 217 mg/dL — ABNORMAL HIGH (ref 65–99)
Potassium: 4.2 mmol/L (ref 3.5–5.3)
Sodium: 139 mmol/L (ref 135–146)
Total Bilirubin: 0.2 mg/dL (ref 0.2–1.2)
Total Protein: 6.5 g/dL (ref 6.1–8.1)

## 2019-07-02 LAB — LIPID PANEL
Cholesterol: 167 mg/dL (ref <200)
HDL: 57 mg/dL (ref >=50)
LDL Cholesterol (Calc): 90 mg/dL (calc)
Non-HDL Cholesterol (Calc): 110 mg/dL (calc) (ref <130)
Total CHOL/HDL Ratio: 2.9 (calc) (ref <5.0)
Triglycerides: 108 mg/dL (ref <150)

## 2019-07-02 LAB — RPR: RPR Ser Ql: NONREACTIVE

## 2019-07-02 LAB — HIV-1 RNA QUANT-NO REFLEX-BLD
HIV 1 RNA Quant: <20 copies/mL
HIV-1 RNA Quant, Log: <1.3 Log copies/mL

## 2019-07-07 ENCOUNTER — Encounter: Payer: Self-pay | Admitting: Endocrinology

## 2019-07-07 ENCOUNTER — Other Ambulatory Visit: Payer: Self-pay

## 2019-07-07 ENCOUNTER — Ambulatory Visit (INDEPENDENT_AMBULATORY_CARE_PROVIDER_SITE_OTHER): Payer: Medicare Other | Admitting: Endocrinology

## 2019-07-07 DIAGNOSIS — E119 Type 2 diabetes mellitus without complications: Secondary | ICD-10-CM | POA: Diagnosis not present

## 2019-07-07 MED ORDER — NOVOLIN 70/30 (70-30) 100 UNIT/ML ~~LOC~~ SUSP: 150.0000 [IU] | Freq: Every day | SUBCUTANEOUS | 11 refills | Status: DC
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
## 2019-07-07 NOTE — Patient Instructions (Addendum)
please change the insulin to "70/30," 150 units with breakfast, and: Please continue the same metformin.   On this type of insulin schedule, you should eat meals on a regular schedule (especially lunch).  If a meal is missed or significantly delayed, your blood sugar could go low.    check your blood sugar twice a day.  vary the time of day when you check, between before the 3 meals, and at bedtime.  also check if you have symptoms of your blood sugar being too high or too low.  please keep a record of the readings and bring it to your next appointment here (or you can bring the meter itself).  You can write it on any piece of paper.  please call us sooner if your blood sugar goes below 70, or if you have a lot of readings over 200. Please come back for a follow-up appointment in 2 weeks, in person if possible.

## 2019-07-18 ENCOUNTER — Other Ambulatory Visit: Payer: Self-pay | Admitting: Internal Medicine

## 2019-07-18 DIAGNOSIS — B20 Human immunodeficiency virus [HIV] disease: Secondary | ICD-10-CM

## 2019-07-20 ENCOUNTER — Encounter: Payer: Self-pay | Admitting: Internal Medicine

## 2019-07-20 ENCOUNTER — Other Ambulatory Visit: Payer: Self-pay

## 2019-07-20 ENCOUNTER — Ambulatory Visit (INDEPENDENT_AMBULATORY_CARE_PROVIDER_SITE_OTHER): Payer: Medicare Other | Admitting: Internal Medicine

## 2019-07-20 DIAGNOSIS — B20 Human immunodeficiency virus [HIV] disease: Secondary | ICD-10-CM

## 2019-07-20 MED ORDER — HYDROXYZINE PAMOATE 25 MG PO CAPS: 25.0000 mg | ORAL_CAPSULE | Freq: Three times a day (TID) | ORAL | 0 refills | Status: DC | PRN

## 2019-07-22 ENCOUNTER — Encounter: Payer: Self-pay | Admitting: Internal Medicine

## 2019-07-22 ENCOUNTER — Telehealth: Payer: Self-pay | Admitting: Internal Medicine

## 2019-07-22 NOTE — Telephone Encounter (Signed)
Patient would need to make an appointment to discuss can do virtual if wanting. If wantign something today would need to see another provider if there is availability

## 2019-07-22 NOTE — Progress Notes (Signed)
Phone visit:  HPI: I connected by phone with Jerolyn Shin after verification.   She has HIV and continues on Biktarvy and denies any missed doses.  CD4 860 and viral load < 20.  She continues to follow Dr. Loanne Drilling for her DM.  Creat wnl.  She is feeling better overall but continues to have depression.  She does continue to follow with [facility].  No new issues.   A/P - continue with Biktarvy and no changes.  RTC 6 months.   ]She was reminded to get her flu shot.   12 mintues spent on the visit

## 2019-07-22 NOTE — Telephone Encounter (Signed)
Pt scheduled  

## 2019-07-22 NOTE — Telephone Encounter (Signed)
Copied from [place] [id number]. Topic: General - Other >> Jul 22, 2019 10:35 AM Celene Kras wrote: Reason for CRM: Pt called and is requesting to have a medication sent over for her yeast infection. Pt is requesting to have a call back when it is sent to pharmacy. Please advise.   [place] [place] [address] Phone: [phone] Fax: [phone] Not a 24 hour pharmacy; exact hours not known.

## 2019-07-23 ENCOUNTER — Other Ambulatory Visit: Payer: Self-pay

## 2019-07-23 ENCOUNTER — Encounter: Payer: Self-pay | Admitting: Internal Medicine

## 2019-07-23 ENCOUNTER — Ambulatory Visit (INDEPENDENT_AMBULATORY_CARE_PROVIDER_SITE_OTHER): Payer: Medicare Other | Admitting: Internal Medicine

## 2019-07-23 DIAGNOSIS — B379 Candidiasis, unspecified: Secondary | ICD-10-CM | POA: Diagnosis not present

## 2019-07-23 MED ORDER — FLUCONAZOLE 150 MG PO TABS: 150.0000 mg | ORAL_TABLET | Freq: Every day | ORAL | 0 refills | Status: DC

## 2019-07-23 NOTE — Assessment & Plan Note (Signed)
Diflucan 2 weeks daily to cover for esophageal although oral and vaginal is more likely.

## 2019-07-23 NOTE — Progress Notes (Signed)
Virtual Visit via Video Note  I connected with Susan [name] on 07/23/19 at  2:00 PM EST by a video enabled telemedicine application and verified that I am speaking with the correct person using two identifiers.  The patient and the provider were at separate locations throughout the entire encounter.   I discussed the limitations of evaluation and management by telemedicine and the availability of in person appointments. The patient expressed understanding and agreed to proceed. The patient and the provider were the only parties present for the visit unless noted in HPI below.  History of Present Illness: The patient is a 58 y.o. female with visit for concerns about yeast infection vaginally and in the mouth/throat. Started weeks ago at least. Uses the inhaler and tries to rinse mouth afterwards. Has pain and itching with white discharge vaginally. Has taken diflucan for yeast infection before and this worked. Denies problems swallowing although sometimes a slight pain with swallowing. Denies fevers or chills. Overall it is stable. Has tried nothing. Has HIV but well controlled and denies missing doses of medication.   Observations/Objective: Appearance: normal, breathing appears normal, tongue appears white plaque but poor video quality, casual grooming, abdomen does not appear distended, throat not well visualized, memory normal, mental status is A and O times 3  Assessment and Plan: See problem oriented charting  Follow Up Instructions: diflucan daily for 2 weeks to cover for esophageal candida.   I discussed the assessment and treatment plan with the patient. The patient was provided an opportunity to ask questions and all were answered. The patient agreed with the plan and demonstrated an understanding of the instructions.   The patient was advised to call back or seek an in-person evaluation if the symptoms worsen or if the condition fails to improve as anticipated.  Hoyt Koch,  MD

## 2019-09-11 ENCOUNTER — Other Ambulatory Visit: Payer: Self-pay | Admitting: Internal Medicine

## 2019-09-18 ENCOUNTER — Telehealth: Payer: Self-pay

## 2019-09-18 NOTE — Telephone Encounter (Signed)
Patient scheduled but will need to do Doxy since she is now in [place]

## 2019-09-18 NOTE — Telephone Encounter (Signed)
Documents placed on Dr. Cordelia Pen desk for him to complete during appt.

## 2019-09-18 NOTE — Telephone Encounter (Signed)
Per Dr. Loanne Drilling, unable to complete [employer] paperwork for [employer] CGM without an appt. Routing this message to the front desk for scheduling purposes.

## 2019-09-24 ENCOUNTER — Encounter: Payer: Self-pay | Admitting: Endocrinology

## 2019-09-24 ENCOUNTER — Ambulatory Visit (INDEPENDENT_AMBULATORY_CARE_PROVIDER_SITE_OTHER): Payer: Medicare Other | Admitting: Endocrinology

## 2019-09-24 ENCOUNTER — Telehealth: Payer: Self-pay

## 2019-09-24 ENCOUNTER — Other Ambulatory Visit: Payer: Self-pay

## 2019-09-24 DIAGNOSIS — E119 Type 2 diabetes mellitus without complications: Secondary | ICD-10-CM | POA: Diagnosis not present

## 2019-09-24 MED ORDER — HUMALOG MIX 50/50 KWIKPEN (50-50) 100 UNIT/ML ~~LOC~~ SUPN: 130.0000 [IU] | PEN_INJECTOR | Freq: Every day | SUBCUTANEOUS | 11 refills | Status: DC

## 2019-09-24 NOTE — Telephone Encounter (Signed)
Company: [employer]  Document: Rx for [employer] and supplies Other records requested: Office notes NOT sent d/t NOT being signed. Routing this message to Dr. Loanne Drilling as a reminder. Will fax office notes once signed  All above requested information EXCEPT FOR OFFICE NOTES has been faxed successfully to the Company listed above. Documents and fax confirmation have been placed in the faxed file for future reference.
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
## 2019-09-24 NOTE — Telephone Encounter (Signed)
Note is completed.

## 2019-09-25 NOTE — Telephone Encounter (Signed)
Company: [employer]  Document: Office notes Other records requested: N/A  All above requested information has been faxed successfully to [employer] listed above. Documents and fax confirmation have been placed in the faxed file for future reference.

## 2019-10-15 ENCOUNTER — Encounter: Payer: Self-pay | Admitting: Internal Medicine

## 2019-10-15 ENCOUNTER — Ambulatory Visit (INDEPENDENT_AMBULATORY_CARE_PROVIDER_SITE_OTHER): Payer: Medicare Other | Admitting: Internal Medicine

## 2019-10-15 DIAGNOSIS — R296 Repeated falls: Secondary | ICD-10-CM | POA: Diagnosis not present

## 2019-10-15 DIAGNOSIS — G609 Hereditary and idiopathic neuropathy, unspecified: Secondary | ICD-10-CM | POA: Diagnosis not present

## 2019-10-15 MED ORDER — TAPENTADOL HCL ER 50 MG PO TB12: 50.0000 mg | ORAL_TABLET | Freq: Two times a day (BID) | ORAL | 0 refills | Status: DC

## 2019-10-15 NOTE — Progress Notes (Signed)
Virtual Visit via Video Note  I connected with Susan [name] on 10/15/19 at  1:40 PM EST by a video enabled telemedicine application and verified that I am speaking with the correct person using two identifiers.  The patient and the provider were at separate locations throughout the entire encounter.   I discussed the limitations of evaluation and management by telemedicine and the availability of in person appointments. The patient expressed understanding and agreed to proceed. The patient and the provider were the only parties present for the visit unless noted in HPI below.  History of Present Illness: The patient is a 59 y.o. female with visit for weakness. Started 1 month ago. Has been falling almost every time she tries to walk. She is having more pain in her lower back and radiating down into her feet. Still taking gabapentin but this is not working as well anymore. Her daughter mentions that she used to be on nucynta which did help her pain and cause her to be more functional. Denies fevers or chills. Denies urinary symptoms. Denies LOC or serious injury due to the fall. Overall it is worsening. Has tried home health in the past which did help her some temporarily.   Observations/Objective: Appearance: smoking, breathing appears normal, casual grooming, abdomen does not appear distended, throat normal, memory stable, mental status is A and O times 3  Assessment and Plan: See problem oriented charting  Follow Up Instructions: referral to home health, rx nucynta 50 mg BID, if no improvement needs to see neurologist/neurosurgeon  I discussed the assessment and treatment plan with the patient. The patient was provided an opportunity to ask questions and all were answered. The patient agreed with the plan and demonstrated an understanding of the instructions.   The patient was advised to call back or seek an in-person evaluation if the symptoms worsen or if the condition fails to improve as  anticipated.  Hoyt Koch, MD

## 2019-10-15 NOTE — Assessment & Plan Note (Signed)
Taking gabapentin, rx nucynta which she has taken with success in the past.

## 2019-10-15 NOTE — Assessment & Plan Note (Signed)
Needs home health to work on balance and stability. If no progress needs to see neurologist/neurosurgeon to assess as she is not able to give a great history of why she is falling and what is going on.

## 2019-10-19 ENCOUNTER — Telehealth: Payer: Self-pay | Admitting: Internal Medicine

## 2019-10-19 NOTE — Telephone Encounter (Signed)
    Pt c/o medication issue:  1. Name of Medication: tapentadol (NUCYNTA ER) 50 MG 12 hr tablet  2. How are you currently taking this medication (dosage and times per day)? n/a  3. Are you having a reaction (difficulty breathing--STAT)? no  4. What is your medication issue? Patient calling, states pharmacy did not fill, needs additional information

## 2019-10-19 NOTE — Telephone Encounter (Signed)
Spoke with patient and she is all set with script. States pharmacy called her today and said it was ready for pick up.

## 2019-10-21 DIAGNOSIS — I10 Essential (primary) hypertension: Secondary | ICD-10-CM | POA: Diagnosis not present

## 2019-10-21 DIAGNOSIS — E119 Type 2 diabetes mellitus without complications: Secondary | ICD-10-CM | POA: Diagnosis not present

## 2019-10-21 DIAGNOSIS — G20 Parkinson's disease: Secondary | ICD-10-CM | POA: Diagnosis not present

## 2019-10-21 DIAGNOSIS — Z9181 History of falling: Secondary | ICD-10-CM | POA: Diagnosis not present

## 2019-10-21 DIAGNOSIS — R296 Repeated falls: Secondary | ICD-10-CM | POA: Diagnosis not present

## 2019-10-26 ENCOUNTER — Telehealth: Payer: Self-pay | Admitting: Internal Medicine

## 2019-10-26 DIAGNOSIS — E119 Type 2 diabetes mellitus without complications: Secondary | ICD-10-CM | POA: Diagnosis not present

## 2019-10-26 DIAGNOSIS — G20 Parkinson's disease: Secondary | ICD-10-CM | POA: Diagnosis not present

## 2019-10-26 DIAGNOSIS — I10 Essential (primary) hypertension: Secondary | ICD-10-CM | POA: Diagnosis not present

## 2019-10-26 DIAGNOSIS — R296 Repeated falls: Secondary | ICD-10-CM | POA: Diagnosis not present

## 2019-10-26 DIAGNOSIS — Z9181 History of falling: Secondary | ICD-10-CM | POA: Diagnosis not present

## 2019-10-26 NOTE — Telephone Encounter (Signed)
    Debbie from [facility] calling to report patient has a productive cough, beige sputum and 93% on room air.   Debbie [phone]

## 2019-10-27 NOTE — Telephone Encounter (Signed)
Called Debbie,LVM to discuss.

## 2019-10-27 NOTE — Telephone Encounter (Signed)
Patient has chronic cough and lung problems. Do we know if this is a change from usual per patient or same as usual?

## 2019-10-28 ENCOUNTER — Telehealth: Payer: Self-pay | Admitting: Internal Medicine

## 2019-10-28 DIAGNOSIS — E119 Type 2 diabetes mellitus without complications: Secondary | ICD-10-CM | POA: Diagnosis not present

## 2019-10-28 DIAGNOSIS — I10 Essential (primary) hypertension: Secondary | ICD-10-CM | POA: Diagnosis not present

## 2019-10-28 DIAGNOSIS — R296 Repeated falls: Secondary | ICD-10-CM | POA: Diagnosis not present

## 2019-10-28 DIAGNOSIS — Z9181 History of falling: Secondary | ICD-10-CM | POA: Diagnosis not present

## 2019-10-28 DIAGNOSIS — G20 Parkinson's disease: Secondary | ICD-10-CM | POA: Diagnosis not present

## 2019-10-28 NOTE — Telephone Encounter (Signed)
New Message:   Will from [facility] is calling to receive orders for Occupational Therapy for this patient for 2x2 and 1x for the 3rd week. Please advise.

## 2019-10-29 NOTE — Telephone Encounter (Signed)
fine

## 2019-10-29 NOTE — Telephone Encounter (Signed)
Verbal orders given  

## 2019-11-02 ENCOUNTER — Telehealth: Payer: Self-pay | Admitting: Endocrinology

## 2019-11-02 DIAGNOSIS — E119 Type 2 diabetes mellitus without complications: Secondary | ICD-10-CM | POA: Diagnosis not present

## 2019-11-02 DIAGNOSIS — R296 Repeated falls: Secondary | ICD-10-CM | POA: Diagnosis not present

## 2019-11-02 DIAGNOSIS — Z9181 History of falling: Secondary | ICD-10-CM | POA: Diagnosis not present

## 2019-11-02 DIAGNOSIS — G20 Parkinson's disease: Secondary | ICD-10-CM | POA: Diagnosis not present

## 2019-11-02 DIAGNOSIS — I10 Essential (primary) hypertension: Secondary | ICD-10-CM | POA: Diagnosis not present

## 2019-11-02 NOTE — Telephone Encounter (Signed)
Sorry, pt does not qualify

## 2019-11-02 NOTE — Telephone Encounter (Signed)
Danae Chen - Nurse with [facility] requests to be called at ph# [phone] re: patient's glucometer (patient needs a new glucometer-interested in the Dexcom). Erica requests to be called at the ph# listed above to be advised.

## 2019-11-02 NOTE — Telephone Encounter (Signed)
I do not believe pt meets criteria. Please advise

## 2019-11-02 NOTE — Telephone Encounter (Signed)
Per Dr. Loanne Drilling, pt will not qualify for Dexcom d/t not meeting criteria. Will further discuss during pt next OV

## 2019-11-03 DIAGNOSIS — G20 Parkinson's disease: Secondary | ICD-10-CM | POA: Diagnosis not present

## 2019-11-03 DIAGNOSIS — I10 Essential (primary) hypertension: Secondary | ICD-10-CM | POA: Diagnosis not present

## 2019-11-03 DIAGNOSIS — G609 Hereditary and idiopathic neuropathy, unspecified: Secondary | ICD-10-CM

## 2019-11-03 DIAGNOSIS — R296 Repeated falls: Secondary | ICD-10-CM | POA: Diagnosis not present

## 2019-11-03 DIAGNOSIS — Z9181 History of falling: Secondary | ICD-10-CM

## 2019-11-03 DIAGNOSIS — O98711 Human immunodeficiency virus [HIV] disease complicating pregnancy, first trimester: Secondary | ICD-10-CM

## 2019-11-03 DIAGNOSIS — E119 Type 2 diabetes mellitus without complications: Secondary | ICD-10-CM

## 2019-11-04 ENCOUNTER — Telehealth: Payer: Self-pay

## 2019-11-04 ENCOUNTER — Telehealth: Payer: Self-pay | Admitting: Internal Medicine

## 2019-11-04 DIAGNOSIS — E119 Type 2 diabetes mellitus without complications: Secondary | ICD-10-CM | POA: Diagnosis not present

## 2019-11-04 DIAGNOSIS — R296 Repeated falls: Secondary | ICD-10-CM | POA: Diagnosis not present

## 2019-11-04 DIAGNOSIS — I10 Essential (primary) hypertension: Secondary | ICD-10-CM | POA: Diagnosis not present

## 2019-11-04 DIAGNOSIS — Z9181 History of falling: Secondary | ICD-10-CM | POA: Diagnosis not present

## 2019-11-04 DIAGNOSIS — G20 Parkinson's disease: Secondary | ICD-10-CM | POA: Diagnosis not present

## 2019-11-04 NOTE — Telephone Encounter (Signed)
New message:   Dominica Severin from [facility] is calling and would like to report a fall for the patient on 11/03/19 in the kitchen. The patient complains of knee soreness from the fall. He states the Pt didn't think it was bad enough to go to the ER. He states she was not using her walker that has been advised for her to do.

## 2019-11-04 NOTE — Telephone Encounter (Signed)
New message   Susan [name] calling checking on their status of the form fax over on 2/23 @ 8:34 am    Fax # that was used [phone]  Office fax # given [phone].

## 2019-11-05 NOTE — Telephone Encounter (Signed)
Noted  

## 2019-11-05 NOTE — Telephone Encounter (Signed)
Will be looking out for it.  

## 2019-11-05 NOTE — Telephone Encounter (Signed)
Form has been received and given to PCP. Will fax back back once it has been completed.

## 2019-11-05 NOTE — Telephone Encounter (Signed)
Forms have been faxed 

## 2019-11-06 DIAGNOSIS — R296 Repeated falls: Secondary | ICD-10-CM | POA: Diagnosis not present

## 2019-11-06 DIAGNOSIS — I10 Essential (primary) hypertension: Secondary | ICD-10-CM | POA: Diagnosis not present

## 2019-11-06 DIAGNOSIS — E119 Type 2 diabetes mellitus without complications: Secondary | ICD-10-CM | POA: Diagnosis not present

## 2019-11-06 DIAGNOSIS — Z9181 History of falling: Secondary | ICD-10-CM | POA: Diagnosis not present

## 2019-11-06 DIAGNOSIS — G20 Parkinson's disease: Secondary | ICD-10-CM | POA: Diagnosis not present

## 2019-11-07 DIAGNOSIS — Z9181 History of falling: Secondary | ICD-10-CM | POA: Diagnosis not present

## 2019-11-07 DIAGNOSIS — R296 Repeated falls: Secondary | ICD-10-CM | POA: Diagnosis not present

## 2019-11-07 DIAGNOSIS — G20 Parkinson's disease: Secondary | ICD-10-CM | POA: Diagnosis not present

## 2019-11-07 DIAGNOSIS — I10 Essential (primary) hypertension: Secondary | ICD-10-CM | POA: Diagnosis not present

## 2019-11-07 DIAGNOSIS — E119 Type 2 diabetes mellitus without complications: Secondary | ICD-10-CM | POA: Diagnosis not present

## 2019-11-09 ENCOUNTER — Other Ambulatory Visit: Payer: Self-pay | Admitting: Internal Medicine

## 2019-11-10 ENCOUNTER — Telehealth: Payer: Self-pay

## 2019-11-10 DIAGNOSIS — I10 Essential (primary) hypertension: Secondary | ICD-10-CM | POA: Diagnosis not present

## 2019-11-10 DIAGNOSIS — E119 Type 2 diabetes mellitus without complications: Secondary | ICD-10-CM | POA: Diagnosis not present

## 2019-11-10 DIAGNOSIS — Z9181 History of falling: Secondary | ICD-10-CM | POA: Diagnosis not present

## 2019-11-10 DIAGNOSIS — R296 Repeated falls: Secondary | ICD-10-CM | POA: Diagnosis not present

## 2019-11-10 DIAGNOSIS — G20 Parkinson's disease: Secondary | ICD-10-CM | POA: Diagnosis not present

## 2019-11-10 NOTE — Telephone Encounter (Signed)
New message    Home health calling to report a patient had a fall in the middle of the night going to the bathroom  Patient denial pain  no injury to report.

## 2019-11-10 NOTE — Telephone Encounter (Signed)
Noted  

## 2019-11-11 ENCOUNTER — Other Ambulatory Visit: Payer: Self-pay

## 2019-11-11 ENCOUNTER — Inpatient Hospital Stay (HOSPITAL_COMMUNITY): Payer: Medicare Other

## 2019-11-11 ENCOUNTER — Inpatient Hospital Stay (HOSPITAL_COMMUNITY)
Admission: EM | Admit: 2019-11-11 | Discharge: 2020-01-09 | DRG: 004 | Disposition: A | Discharge status: Skilled Nursing Facility | Payer: Medicare Other | Attending: Internal Medicine | Admitting: Internal Medicine

## 2019-11-11 ENCOUNTER — Emergency Department (HOSPITAL_COMMUNITY): Payer: Medicare Other

## 2019-11-11 DIAGNOSIS — B953 Streptococcus pneumoniae as the cause of diseases classified elsewhere: Secondary | ICD-10-CM | POA: Diagnosis present

## 2019-11-11 DIAGNOSIS — B9561 Methicillin susceptible Staphylococcus aureus infection as the cause of diseases classified elsewhere: Secondary | ICD-10-CM | POA: Diagnosis not present

## 2019-11-11 DIAGNOSIS — J9600 Acute respiratory failure, unspecified whether with hypoxia or hypercapnia: Secondary | ICD-10-CM | POA: Diagnosis not present

## 2019-11-11 DIAGNOSIS — G92 Toxic encephalopathy: Secondary | ICD-10-CM | POA: Diagnosis not present

## 2019-11-11 DIAGNOSIS — R451 Restlessness and agitation: Secondary | ICD-10-CM

## 2019-11-11 DIAGNOSIS — E876 Hypokalemia: Secondary | ICD-10-CM | POA: Diagnosis not present

## 2019-11-11 DIAGNOSIS — A419 Sepsis, unspecified organism: Secondary | ICD-10-CM | POA: Diagnosis not present

## 2019-11-11 DIAGNOSIS — R4020 Unspecified coma: Secondary | ICD-10-CM | POA: Diagnosis not present

## 2019-11-11 DIAGNOSIS — T461X5A Adverse effect of calcium-channel blockers, initial encounter: Secondary | ICD-10-CM | POA: Diagnosis not present

## 2019-11-11 DIAGNOSIS — E870 Hyperosmolality and hypernatremia: Secondary | ICD-10-CM | POA: Diagnosis not present

## 2019-11-11 DIAGNOSIS — I5021 Acute systolic (congestive) heart failure: Secondary | ICD-10-CM | POA: Diagnosis not present

## 2019-11-11 DIAGNOSIS — Z21 Asymptomatic human immunodeficiency virus [HIV] infection status: Secondary | ICD-10-CM | POA: Diagnosis present

## 2019-11-11 DIAGNOSIS — G9341 Metabolic encephalopathy: Secondary | ICD-10-CM | POA: Diagnosis not present

## 2019-11-11 DIAGNOSIS — R531 Weakness: Secondary | ICD-10-CM | POA: Diagnosis not present

## 2019-11-11 DIAGNOSIS — Z9851 Tubal ligation status: Secondary | ICD-10-CM

## 2019-11-11 DIAGNOSIS — R6521 Severe sepsis with septic shock: Secondary | ICD-10-CM | POA: Diagnosis not present

## 2019-11-11 DIAGNOSIS — I63513 Cerebral infarction due to unspecified occlusion or stenosis of bilateral middle cerebral arteries: Secondary | ICD-10-CM | POA: Diagnosis not present

## 2019-11-11 DIAGNOSIS — E785 Hyperlipidemia, unspecified: Secondary | ICD-10-CM | POA: Diagnosis present

## 2019-11-11 DIAGNOSIS — Z794 Long term (current) use of insulin: Secondary | ICD-10-CM

## 2019-11-11 DIAGNOSIS — B20 Human immunodeficiency virus [HIV] disease: Secondary | ICD-10-CM | POA: Diagnosis not present

## 2019-11-11 DIAGNOSIS — J150 Pneumonia due to Klebsiella pneumoniae: Secondary | ICD-10-CM | POA: Diagnosis not present

## 2019-11-11 DIAGNOSIS — J690 Pneumonitis due to inhalation of food and vomit: Secondary | ICD-10-CM | POA: Diagnosis not present

## 2019-11-11 DIAGNOSIS — R4182 Altered mental status, unspecified: Secondary | ICD-10-CM | POA: Diagnosis not present

## 2019-11-11 DIAGNOSIS — I639 Cerebral infarction, unspecified: Secondary | ICD-10-CM | POA: Diagnosis not present

## 2019-11-11 DIAGNOSIS — N179 Acute kidney failure, unspecified: Secondary | ICD-10-CM | POA: Diagnosis not present

## 2019-11-11 DIAGNOSIS — Z931 Gastrostomy status: Secondary | ICD-10-CM | POA: Diagnosis not present

## 2019-11-11 DIAGNOSIS — Z930 Tracheostomy status: Secondary | ICD-10-CM

## 2019-11-11 DIAGNOSIS — Z6841 Body Mass Index (BMI) 40.0 and over, adult: Secondary | ICD-10-CM | POA: Diagnosis not present

## 2019-11-11 DIAGNOSIS — Z8673 Personal history of transient ischemic attack (TIA), and cerebral infarction without residual deficits: Secondary | ICD-10-CM | POA: Insufficient documentation

## 2019-11-11 DIAGNOSIS — I428 Other cardiomyopathies: Secondary | ICD-10-CM | POA: Diagnosis present

## 2019-11-11 DIAGNOSIS — I63413 Cerebral infarction due to embolism of bilateral middle cerebral arteries: Secondary | ICD-10-CM | POA: Diagnosis not present

## 2019-11-11 DIAGNOSIS — F05 Delirium due to known physiological condition: Secondary | ICD-10-CM | POA: Diagnosis not present

## 2019-11-11 DIAGNOSIS — N1832 Chronic kidney disease, stage 3b: Secondary | ICD-10-CM | POA: Diagnosis present

## 2019-11-11 DIAGNOSIS — K219 Gastro-esophageal reflux disease without esophagitis: Secondary | ICD-10-CM | POA: Diagnosis not present

## 2019-11-11 DIAGNOSIS — I700 Atherosclerosis of aorta: Secondary | ICD-10-CM | POA: Diagnosis not present

## 2019-11-11 DIAGNOSIS — J13 Pneumonia due to Streptococcus pneumoniae: Secondary | ICD-10-CM | POA: Diagnosis not present

## 2019-11-11 DIAGNOSIS — J9690 Respiratory failure, unspecified, unspecified whether with hypoxia or hypercapnia: Secondary | ICD-10-CM | POA: Diagnosis not present

## 2019-11-11 DIAGNOSIS — E872 Acidosis: Secondary | ICD-10-CM | POA: Diagnosis present

## 2019-11-11 DIAGNOSIS — J15212 Pneumonia due to Methicillin resistant Staphylococcus aureus: Secondary | ICD-10-CM | POA: Diagnosis present

## 2019-11-11 DIAGNOSIS — Y92239 Unspecified place in hospital as the place of occurrence of the external cause: Secondary | ICD-10-CM | POA: Diagnosis not present

## 2019-11-11 DIAGNOSIS — D649 Anemia, unspecified: Secondary | ICD-10-CM | POA: Diagnosis not present

## 2019-11-11 DIAGNOSIS — I330 Acute and subacute infective endocarditis: Secondary | ICD-10-CM | POA: Diagnosis not present

## 2019-11-11 DIAGNOSIS — B961 Klebsiella pneumoniae [K. pneumoniae] as the cause of diseases classified elsewhere: Secondary | ICD-10-CM | POA: Diagnosis not present

## 2019-11-11 DIAGNOSIS — J45909 Unspecified asthma, uncomplicated: Secondary | ICD-10-CM | POA: Diagnosis present

## 2019-11-11 DIAGNOSIS — E1122 Type 2 diabetes mellitus with diabetic chronic kidney disease: Secondary | ICD-10-CM | POA: Diagnosis present

## 2019-11-11 DIAGNOSIS — Z515 Encounter for palliative care: Secondary | ICD-10-CM

## 2019-11-11 DIAGNOSIS — R579 Shock, unspecified: Secondary | ICD-10-CM | POA: Diagnosis not present

## 2019-11-11 DIAGNOSIS — R296 Repeated falls: Secondary | ICD-10-CM | POA: Diagnosis present

## 2019-11-11 DIAGNOSIS — F419 Anxiety disorder, unspecified: Secondary | ICD-10-CM | POA: Diagnosis present

## 2019-11-11 DIAGNOSIS — I959 Hypotension, unspecified: Secondary | ICD-10-CM

## 2019-11-11 DIAGNOSIS — Z9289 Personal history of other medical treatment: Secondary | ICD-10-CM

## 2019-11-11 DIAGNOSIS — Y95 Nosocomial condition: Secondary | ICD-10-CM | POA: Diagnosis not present

## 2019-11-11 DIAGNOSIS — E11649 Type 2 diabetes mellitus with hypoglycemia without coma: Secondary | ICD-10-CM | POA: Diagnosis not present

## 2019-11-11 DIAGNOSIS — G931 Anoxic brain damage, not elsewhere classified: Secondary | ICD-10-CM | POA: Diagnosis present

## 2019-11-11 DIAGNOSIS — E119 Type 2 diabetes mellitus without complications: Secondary | ICD-10-CM | POA: Diagnosis not present

## 2019-11-11 DIAGNOSIS — R1319 Other dysphagia: Secondary | ICD-10-CM

## 2019-11-11 DIAGNOSIS — R410 Disorientation, unspecified: Secondary | ICD-10-CM | POA: Diagnosis not present

## 2019-11-11 DIAGNOSIS — J9601 Acute respiratory failure with hypoxia: Secondary | ICD-10-CM | POA: Diagnosis present

## 2019-11-11 DIAGNOSIS — I952 Hypotension due to drugs: Secondary | ICD-10-CM | POA: Diagnosis not present

## 2019-11-11 DIAGNOSIS — T465X5A Adverse effect of other antihypertensive drugs, initial encounter: Secondary | ICD-10-CM | POA: Diagnosis not present

## 2019-11-11 DIAGNOSIS — Z9911 Dependence on respirator [ventilator] status: Secondary | ICD-10-CM | POA: Diagnosis not present

## 2019-11-11 DIAGNOSIS — Z452 Encounter for adjustment and management of vascular access device: Secondary | ICD-10-CM

## 2019-11-11 DIAGNOSIS — F250 Schizoaffective disorder, bipolar type: Secondary | ICD-10-CM | POA: Diagnosis present

## 2019-11-11 DIAGNOSIS — Z888 Allergy status to other drugs, medicaments and biological substances status: Secondary | ICD-10-CM | POA: Diagnosis not present

## 2019-11-11 DIAGNOSIS — A4102 Sepsis due to Methicillin resistant Staphylococcus aureus: Principal | ICD-10-CM | POA: Diagnosis present

## 2019-11-11 DIAGNOSIS — Z813 Family history of other psychoactive substance abuse and dependence: Secondary | ICD-10-CM

## 2019-11-11 DIAGNOSIS — I119 Hypertensive heart disease without heart failure: Secondary | ICD-10-CM | POA: Diagnosis not present

## 2019-11-11 DIAGNOSIS — J449 Chronic obstructive pulmonary disease, unspecified: Secondary | ICD-10-CM | POA: Diagnosis not present

## 2019-11-11 DIAGNOSIS — Z7189 Other specified counseling: Secondary | ICD-10-CM

## 2019-11-11 DIAGNOSIS — F17210 Nicotine dependence, cigarettes, uncomplicated: Secondary | ICD-10-CM | POA: Diagnosis present

## 2019-11-11 DIAGNOSIS — R339 Retention of urine, unspecified: Secondary | ICD-10-CM | POA: Diagnosis not present

## 2019-11-11 DIAGNOSIS — K5900 Constipation, unspecified: Secondary | ICD-10-CM | POA: Diagnosis present

## 2019-11-11 DIAGNOSIS — R0902 Hypoxemia: Secondary | ICD-10-CM

## 2019-11-11 DIAGNOSIS — T40601A Poisoning by unspecified narcotics, accidental (unintentional), initial encounter: Secondary | ICD-10-CM | POA: Diagnosis present

## 2019-11-11 DIAGNOSIS — M2550 Pain in unspecified joint: Secondary | ICD-10-CM | POA: Diagnosis not present

## 2019-11-11 DIAGNOSIS — K921 Melena: Secondary | ICD-10-CM | POA: Diagnosis not present

## 2019-11-11 DIAGNOSIS — M1990 Unspecified osteoarthritis, unspecified site: Secondary | ICD-10-CM | POA: Diagnosis not present

## 2019-11-11 DIAGNOSIS — R52 Pain, unspecified: Secondary | ICD-10-CM | POA: Diagnosis not present

## 2019-11-11 DIAGNOSIS — I5181 Takotsubo syndrome: Secondary | ICD-10-CM | POA: Diagnosis not present

## 2019-11-11 DIAGNOSIS — Z4659 Encounter for fitting and adjustment of other gastrointestinal appliance and device: Secondary | ICD-10-CM

## 2019-11-11 DIAGNOSIS — R7881 Bacteremia: Secondary | ICD-10-CM | POA: Diagnosis not present

## 2019-11-11 DIAGNOSIS — D72829 Elevated white blood cell count, unspecified: Secondary | ICD-10-CM | POA: Diagnosis not present

## 2019-11-11 DIAGNOSIS — I6340 Cerebral infarction due to embolism of unspecified cerebral artery: Secondary | ICD-10-CM | POA: Insufficient documentation

## 2019-11-11 DIAGNOSIS — R627 Adult failure to thrive: Secondary | ICD-10-CM | POA: Diagnosis not present

## 2019-11-11 DIAGNOSIS — G40909 Epilepsy, unspecified, not intractable, without status epilepticus: Secondary | ICD-10-CM | POA: Diagnosis present

## 2019-11-11 DIAGNOSIS — J9809 Other diseases of bronchus, not elsewhere classified: Secondary | ICD-10-CM

## 2019-11-11 DIAGNOSIS — Z4682 Encounter for fitting and adjustment of non-vascular catheter: Secondary | ICD-10-CM | POA: Diagnosis not present

## 2019-11-11 DIAGNOSIS — K8020 Calculus of gallbladder without cholecystitis without obstruction: Secondary | ICD-10-CM | POA: Diagnosis not present

## 2019-11-11 DIAGNOSIS — J80 Acute respiratory distress syndrome: Secondary | ICD-10-CM | POA: Diagnosis not present

## 2019-11-11 DIAGNOSIS — E1165 Type 2 diabetes mellitus with hyperglycemia: Secondary | ICD-10-CM | POA: Diagnosis not present

## 2019-11-11 DIAGNOSIS — I130 Hypertensive heart and chronic kidney disease with heart failure and stage 1 through stage 4 chronic kidney disease, or unspecified chronic kidney disease: Secondary | ICD-10-CM | POA: Diagnosis present

## 2019-11-11 DIAGNOSIS — Z20822 Contact with and (suspected) exposure to covid-19: Secondary | ICD-10-CM | POA: Diagnosis not present

## 2019-11-11 DIAGNOSIS — Z818 Family history of other mental and behavioral disorders: Secondary | ICD-10-CM

## 2019-11-11 DIAGNOSIS — I358 Other nonrheumatic aortic valve disorders: Secondary | ICD-10-CM | POA: Diagnosis present

## 2019-11-11 DIAGNOSIS — J159 Unspecified bacterial pneumonia: Secondary | ICD-10-CM | POA: Diagnosis not present

## 2019-11-11 DIAGNOSIS — Z978 Presence of other specified devices: Secondary | ICD-10-CM

## 2019-11-11 DIAGNOSIS — B955 Unspecified streptococcus as the cause of diseases classified elsewhere: Secondary | ICD-10-CM | POA: Diagnosis not present

## 2019-11-11 DIAGNOSIS — J189 Pneumonia, unspecified organism: Secondary | ICD-10-CM | POA: Diagnosis present

## 2019-11-11 DIAGNOSIS — W19XXXA Unspecified fall, initial encounter: Secondary | ICD-10-CM | POA: Diagnosis present

## 2019-11-11 DIAGNOSIS — T17500A Unspecified foreign body in bronchus causing asphyxiation, initial encounter: Secondary | ICD-10-CM

## 2019-11-11 DIAGNOSIS — E871 Hypo-osmolality and hyponatremia: Secondary | ICD-10-CM | POA: Diagnosis not present

## 2019-11-11 DIAGNOSIS — E1142 Type 2 diabetes mellitus with diabetic polyneuropathy: Secondary | ICD-10-CM | POA: Diagnosis present

## 2019-11-11 DIAGNOSIS — R1310 Dysphagia, unspecified: Secondary | ICD-10-CM | POA: Diagnosis not present

## 2019-11-11 DIAGNOSIS — M6282 Rhabdomyolysis: Secondary | ICD-10-CM | POA: Diagnosis present

## 2019-11-11 DIAGNOSIS — G4700 Insomnia, unspecified: Secondary | ICD-10-CM | POA: Diagnosis present

## 2019-11-11 DIAGNOSIS — R404 Transient alteration of awareness: Secondary | ICD-10-CM | POA: Diagnosis not present

## 2019-11-11 DIAGNOSIS — G839 Paralytic syndrome, unspecified: Secondary | ICD-10-CM | POA: Diagnosis not present

## 2019-11-11 DIAGNOSIS — R069 Unspecified abnormalities of breathing: Secondary | ICD-10-CM

## 2019-11-11 DIAGNOSIS — Y92009 Unspecified place in unspecified non-institutional (private) residence as the place of occurrence of the external cause: Secondary | ICD-10-CM

## 2019-11-11 DIAGNOSIS — Z743 Need for continuous supervision: Secondary | ICD-10-CM | POA: Diagnosis not present

## 2019-11-11 DIAGNOSIS — R34 Anuria and oliguria: Secondary | ICD-10-CM | POA: Diagnosis present

## 2019-11-11 DIAGNOSIS — Z79899 Other long term (current) drug therapy: Secondary | ICD-10-CM

## 2019-11-11 DIAGNOSIS — G609 Hereditary and idiopathic neuropathy, unspecified: Secondary | ICD-10-CM | POA: Diagnosis present

## 2019-11-11 DIAGNOSIS — R4701 Aphasia: Secondary | ICD-10-CM | POA: Diagnosis not present

## 2019-11-11 DIAGNOSIS — N170 Acute kidney failure with tubular necrosis: Secondary | ICD-10-CM | POA: Diagnosis not present

## 2019-11-11 DIAGNOSIS — I5022 Chronic systolic (congestive) heart failure: Secondary | ICD-10-CM | POA: Diagnosis not present

## 2019-11-11 DIAGNOSIS — T4275XA Adverse effect of unspecified antiepileptic and sedative-hypnotic drugs, initial encounter: Secondary | ICD-10-CM | POA: Diagnosis not present

## 2019-11-11 DIAGNOSIS — R0682 Tachypnea, not elsewhere classified: Secondary | ICD-10-CM

## 2019-11-11 DIAGNOSIS — E7800 Pure hypercholesterolemia, unspecified: Secondary | ICD-10-CM | POA: Diagnosis present

## 2019-11-11 DIAGNOSIS — Z7401 Bed confinement status: Secondary | ICD-10-CM | POA: Diagnosis not present

## 2019-11-11 DIAGNOSIS — T82528A Displacement of other cardiac and vascular devices and implants, initial encounter: Secondary | ICD-10-CM

## 2019-11-11 DIAGNOSIS — R918 Other nonspecific abnormal finding of lung field: Secondary | ICD-10-CM | POA: Diagnosis not present

## 2019-11-11 DIAGNOSIS — R000 Tachycardia, unspecified: Secondary | ICD-10-CM | POA: Diagnosis not present

## 2019-11-11 DIAGNOSIS — G9340 Encephalopathy, unspecified: Secondary | ICD-10-CM | POA: Diagnosis not present

## 2019-11-11 DIAGNOSIS — Z781 Physical restraint status: Secondary | ICD-10-CM

## 2019-11-11 DIAGNOSIS — E878 Other disorders of electrolyte and fluid balance, not elsewhere classified: Secondary | ICD-10-CM | POA: Diagnosis present

## 2019-11-11 LAB — BASIC METABOLIC PANEL
Anion gap: 15 (ref 5–15)
Anion gap: 10 (ref 5–15)
BUN: 14 mg/dL (ref 6–20)
BUN: 19 mg/dL (ref 6–20)
CO2: 20 mmol/L — ABNORMAL LOW (ref 22–32)
CO2: 19 mmol/L — ABNORMAL LOW (ref 22–32)
Calcium: 8.1 mg/dL — ABNORMAL LOW (ref 8.9–10.3)
Calcium: 7.9 mg/dL — ABNORMAL LOW (ref 8.9–10.3)
Chloride: 105 mmol/L (ref 98–111)
Chloride: 112 mmol/L — ABNORMAL HIGH (ref 98–111)
Creatinine, Ser: 2.05 mg/dL — ABNORMAL HIGH (ref 0.44–1.00)
Creatinine, Ser: 2.08 mg/dL — ABNORMAL HIGH (ref 0.44–1.00)
GFR calc Af Amer: 30 mL/min — ABNORMAL LOW (ref >60)
GFR calc Af Amer: 30 mL/min — ABNORMAL LOW (ref >60)
GFR calc non Af Amer: 26 mL/min — ABNORMAL LOW (ref >60)
GFR calc non Af Amer: 26 mL/min — ABNORMAL LOW (ref >60)
Glucose, Bld: 270 mg/dL — ABNORMAL HIGH (ref 70–99)
Glucose, Bld: 104 mg/dL — ABNORMAL HIGH (ref 70–99)
Potassium: 3.1 mmol/L — ABNORMAL LOW (ref 3.5–5.1)
Potassium: 3.5 mmol/L (ref 3.5–5.1)
Sodium: 140 mmol/L (ref 135–145)
Sodium: 141 mmol/L (ref 135–145)

## 2019-11-11 LAB — POCT I-STAT 7, (LYTES, BLD GAS, ICA,H+H)
Acid-base deficit: 7 mmol/L — ABNORMAL HIGH (ref 0.0–2.0)
Acid-base deficit: 8 mmol/L — ABNORMAL HIGH (ref 0.0–2.0)
Bicarbonate: 22.5 mmol/L (ref 20.0–28.0)
Bicarbonate: 22.6 mmol/L (ref 20.0–28.0)
Calcium, Ion: 1.14 mmol/L — ABNORMAL LOW (ref 1.15–1.40)
Calcium, Ion: 1.23 mmol/L (ref 1.15–1.40)
HCT: 43 % (ref 36.0–46.0)
HCT: 48 % — ABNORMAL HIGH (ref 36.0–46.0)
Hemoglobin: 14.6 g/dL (ref 12.0–15.0)
Hemoglobin: 16.3 g/dL — ABNORMAL HIGH (ref 12.0–15.0)
O2 Saturation: 98 %
O2 Saturation: 85 %
Patient temperature: 100.2
Patient temperature: 37.7
Potassium: 2.6 mmol/L — CL (ref 3.5–5.1)
Potassium: 3.3 mmol/L — ABNORMAL LOW (ref 3.5–5.1)
Sodium: 143 mmol/L (ref 135–145)
Sodium: 143 mmol/L (ref 135–145)
TCO2: 24 mmol/L (ref 22–32)
TCO2: 25 mmol/L (ref 22–32)
pCO2 arterial: 62 mmHg — ABNORMAL HIGH (ref 32.0–48.0)
pCO2 arterial: 72.3 mmHg (ref 32.0–48.0)
pH, Arterial: 7.173 — CL (ref 7.350–7.450)
pH, Arterial: 7.108 — CL (ref 7.350–7.450)
pO2, Arterial: 133 mmHg — ABNORMAL HIGH (ref 83.0–108.0)
pO2, Arterial: 71 mmHg — ABNORMAL LOW (ref 83.0–108.0)

## 2019-11-11 LAB — CBC WITH DIFFERENTIAL/PLATELET
Abs Immature Granulocytes: 0 10*3/uL (ref 0.00–0.07)
Basophils Absolute: 0.1 10*3/uL (ref 0.0–0.1)
Basophils Relative: 2 %
Eosinophils Absolute: 0 10*3/uL (ref 0.0–0.5)
Eosinophils Relative: 1 %
HCT: 53.2 % — ABNORMAL HIGH (ref 36.0–46.0)
Hemoglobin: 16.4 g/dL — ABNORMAL HIGH (ref 12.0–15.0)
Lymphocytes Relative: 57 %
Lymphs Abs: 2.7 10*3/uL (ref 0.7–4.0)
MCH: 28.3 pg (ref 26.0–34.0)
MCHC: 30.8 g/dL (ref 30.0–36.0)
MCV: 91.9 fL (ref 80.0–100.0)
Monocytes Absolute: 0.5 10*3/uL (ref 0.1–1.0)
Monocytes Relative: 10 %
Myelocytes: 1 %
Neutro Abs: 1.4 10*3/uL — ABNORMAL LOW (ref 1.7–7.7)
Neutrophils Relative %: 29 %
Platelets: 262 10*3/uL (ref 150–400)
RBC: 5.79 MIL/uL — ABNORMAL HIGH (ref 3.87–5.11)
RDW: 14.1 % (ref 11.5–15.5)
WBC: 4.7 10*3/uL (ref 4.0–10.5)
nRBC: 1.3 % — ABNORMAL HIGH (ref 0.0–0.2)
nRBC: 4 /100 WBC — ABNORMAL HIGH

## 2019-11-11 LAB — COMPREHENSIVE METABOLIC PANEL
ALT: 18 U/L (ref 0–44)
AST: 43 U/L — ABNORMAL HIGH (ref 15–41)
Albumin: 2.4 g/dL — ABNORMAL LOW (ref 3.5–5.0)
Alkaline Phosphatase: 89 U/L (ref 38–126)
Anion gap: 22 — ABNORMAL HIGH (ref 5–15)
BUN: 15 mg/dL (ref 6–20)
CO2: 17 mmol/L — ABNORMAL LOW (ref 22–32)
Calcium: 8.4 mg/dL — ABNORMAL LOW (ref 8.9–10.3)
Chloride: 101 mmol/L (ref 98–111)
Creatinine, Ser: 2.46 mg/dL — ABNORMAL HIGH (ref 0.44–1.00)
GFR calc Af Amer: 24 mL/min — ABNORMAL LOW (ref >60)
GFR calc non Af Amer: 21 mL/min — ABNORMAL LOW (ref >60)
Glucose, Bld: 324 mg/dL — ABNORMAL HIGH (ref 70–99)
Potassium: 3.7 mmol/L (ref 3.5–5.1)
Sodium: 140 mmol/L (ref 135–145)
Total Bilirubin: 0.4 mg/dL (ref 0.3–1.2)
Total Protein: 5.5 g/dL — ABNORMAL LOW (ref 6.5–8.1)

## 2019-11-11 LAB — I-STAT CHEM 8, ED
BUN: 15 mg/dL (ref 6–20)
Calcium, Ion: 0.98 mmol/L — ABNORMAL LOW (ref 1.15–1.40)
Chloride: 102 mmol/L (ref 98–111)
Creatinine, Ser: 2.1 mg/dL — ABNORMAL HIGH (ref 0.44–1.00)
Glucose, Bld: 303 mg/dL — ABNORMAL HIGH (ref 70–99)
HCT: 50 % — ABNORMAL HIGH (ref 36.0–46.0)
Hemoglobin: 17 g/dL — ABNORMAL HIGH (ref 12.0–15.0)
Potassium: 3.4 mmol/L — ABNORMAL LOW (ref 3.5–5.1)
Sodium: 139 mmol/L (ref 135–145)
TCO2: 20 mmol/L — ABNORMAL LOW (ref 22–32)

## 2019-11-11 LAB — URINALYSIS, ROUTINE W REFLEX MICROSCOPIC
Bacteria, UA: NONE SEEN
Bilirubin Urine: NEGATIVE
Glucose, UA: >=500 mg/dL — AB
Hgb urine dipstick: NEGATIVE
Ketones, ur: NEGATIVE mg/dL
Leukocytes,Ua: NEGATIVE
Nitrite: NEGATIVE
Protein, ur: NEGATIVE mg/dL
Specific Gravity, Urine: 1.012 (ref 1.005–1.030)
pH: 6 (ref 5.0–8.0)

## 2019-11-11 LAB — MAGNESIUM: Magnesium: 1.9 mg/dL (ref 1.7–2.4)

## 2019-11-11 LAB — HEMOGLOBIN A1C
Hgb A1c MFr Bld: 12.3 % — ABNORMAL HIGH (ref 4.8–5.6)
Mean Plasma Glucose: 306.31 mg/dL

## 2019-11-11 LAB — RAPID URINE DRUG SCREEN, HOSP PERFORMED
Amphetamines: NOT DETECTED
Barbiturates: NOT DETECTED
Benzodiazepines: NOT DETECTED
Cocaine: NOT DETECTED
Opiates: NOT DETECTED
Tetrahydrocannabinol: POSITIVE — AB

## 2019-11-11 LAB — CBG MONITORING, ED
Glucose-Capillary: 312 mg/dL — ABNORMAL HIGH (ref 70–99)
Glucose-Capillary: 313 mg/dL — ABNORMAL HIGH (ref 70–99)
Glucose-Capillary: 225 mg/dL — ABNORMAL HIGH (ref 70–99)

## 2019-11-11 LAB — GLUCOSE, CAPILLARY
Glucose-Capillary: 278 mg/dL — ABNORMAL HIGH (ref 70–99)
Glucose-Capillary: 189 mg/dL — ABNORMAL HIGH (ref 70–99)
Glucose-Capillary: 174 mg/dL — ABNORMAL HIGH (ref 70–99)
Glucose-Capillary: 172 mg/dL — ABNORMAL HIGH (ref 70–99)
Glucose-Capillary: 92 mg/dL (ref 70–99)
Glucose-Capillary: 98 mg/dL (ref 70–99)

## 2019-11-11 LAB — LACTIC ACID, PLASMA
Lactic Acid, Venous: >11 mmol/L (ref 0.5–1.9)
Lactic Acid, Venous: 5.4 mmol/L (ref 0.5–1.9)
Lactic Acid, Venous: 3.9 mmol/L (ref 0.5–1.9)

## 2019-11-11 LAB — CK: Total CK: 868 U/L — ABNORMAL HIGH (ref 38–234)

## 2019-11-11 LAB — PROTIME-INR
INR: 1.3 — ABNORMAL HIGH (ref 0.8–1.2)
Prothrombin Time: 15.6 seconds — ABNORMAL HIGH (ref 11.4–15.2)

## 2019-11-11 LAB — I-STAT BETA HCG BLOOD, ED (MC, WL, AP ONLY): I-stat hCG, quantitative: <5 m[IU]/mL (ref <5)

## 2019-11-11 LAB — SALICYLATE LEVEL: Salicylate Lvl: <7 mg/dL — ABNORMAL LOW (ref 7.0–30.0)

## 2019-11-11 LAB — MRSA PCR SCREENING: MRSA by PCR: POSITIVE — AB

## 2019-11-11 LAB — BETA-HYDROXYBUTYRIC ACID: Beta-Hydroxybutyric Acid: 0.14 mmol/L (ref 0.05–0.27)

## 2019-11-11 LAB — ETHANOL: Alcohol, Ethyl (B): <10 mg/dL (ref <10)

## 2019-11-11 LAB — RESPIRATORY PANEL BY RT PCR (FLU A&B, COVID)
Influenza A by PCR: NEGATIVE
Influenza B by PCR: NEGATIVE
SARS Coronavirus 2 by RT PCR: NEGATIVE

## 2019-11-11 LAB — CORTISOL: Cortisol, Plasma: 57.2 ug/dL

## 2019-11-11 LAB — PHOSPHORUS: Phosphorus: 4.6 mg/dL (ref 2.5–4.6)

## 2019-11-11 LAB — ACETAMINOPHEN LEVEL: Acetaminophen (Tylenol), Serum: <10 ug/mL — ABNORMAL LOW (ref 10–30)

## 2019-11-11 IMAGING — DX DG CHEST 1V PORT
1 series · 1 of 1 positions shown · non-contrast
Comparison: [DATE]

CLINICAL DATA: Hypoxia

EXAM:
PORTABLE CHEST 1 VIEW

[chest ap]
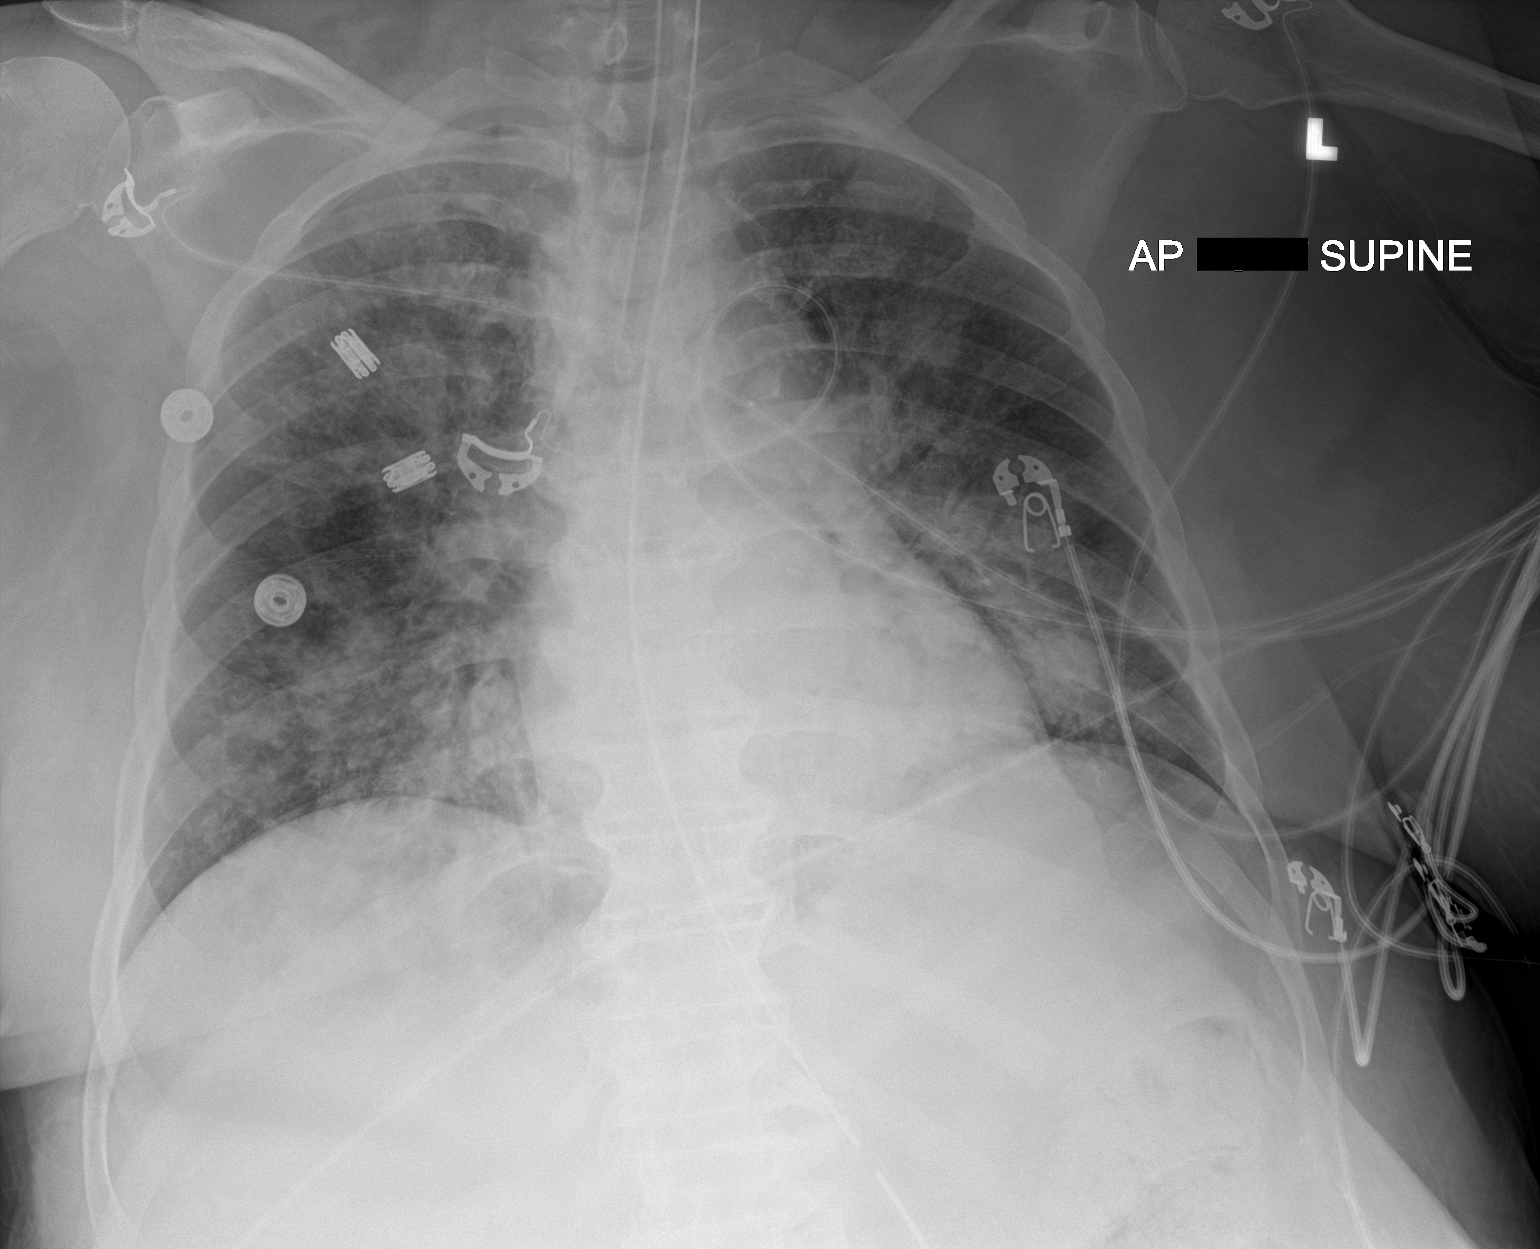

[1 of 1 positions shown; findings below may reference images not displayed]

FINDINGS: Endotracheal tube tip is 1.5 cm above the carina. Nasogastric tube
tip is below the diaphragm with side port seen in the stomach. No
pneumothorax. There is airspace opacity in each lower lobe with
consolidation in the left lower lobe. Ill-defined airspace opacity
is also noted to a lesser degree in the upper lobes, slightly more
on the right than on the left. Heart size and pulmonary vascularity
are normal. No adenopathy. No bone lesions.
IMPRESSION: Tube positions as described without pneumothorax. Note that
endotracheal tube is fairly close to the carina. It may be
reasonable to consider withdrawing endotracheal tube 1.5-2 cm.

Extensive multifocal infiltrate with consolidation greatest in the
left lower lobe. These findings are indicative of multifocal
pneumonia. Heart size normal. No evident adenopathy.

## 2019-11-11 IMAGING — CT CT HEAD W/O CM
4 series · 17 of 47 positions shown, 19 images · non-contrast
Comparison: [DATE]

CLINICAL DATA: Encephalopathy.  Ventilator support.

EXAM:
CT HEAD WITHOUT CONTRAST
TECHNIQUE: Contiguous axial images were obtained from the base of the skull
through the vertex without intravenous contrast.

[Series 2: head wo · axial · 0.43mm/px · z∈[+1033,+1153]mm · 6 of 34 slices shown, 8 images]
[im 5/34  brain]
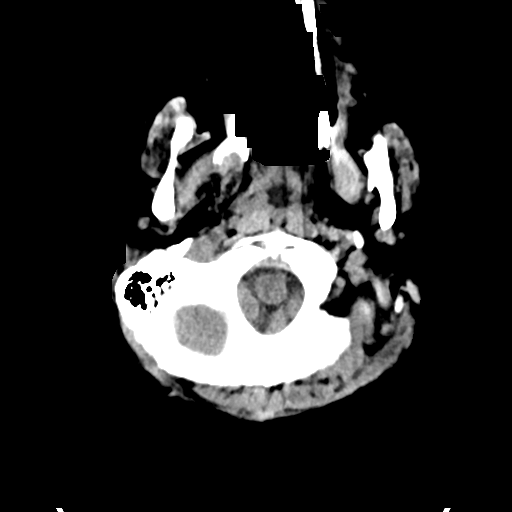
[im 5/34  bone]
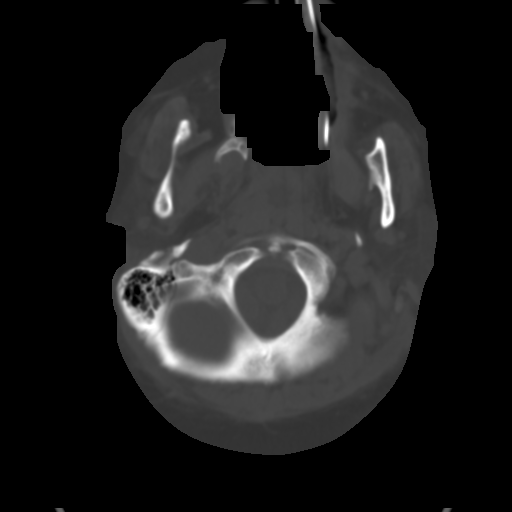
[im 10/34  brain]
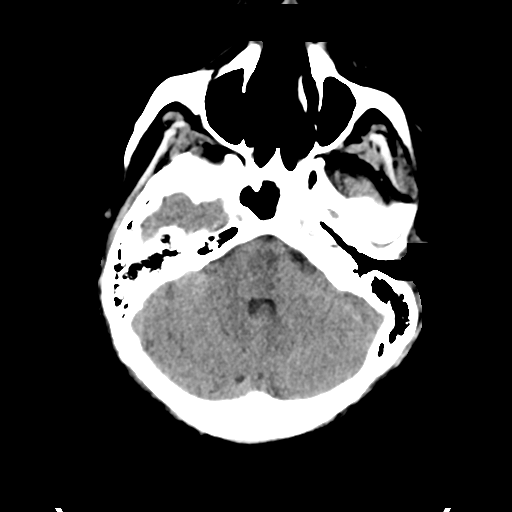
[im 15/34  brain]
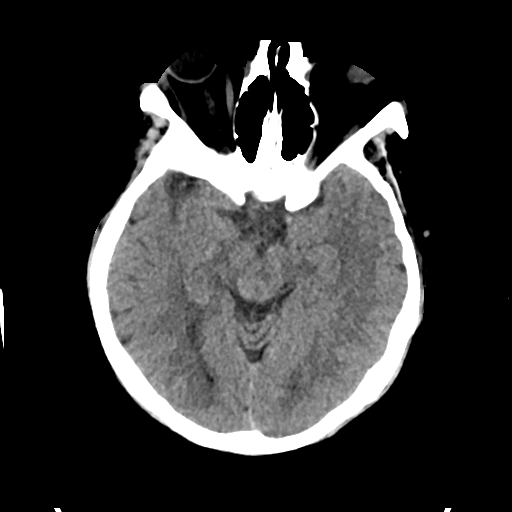
[im 19/34  brain]
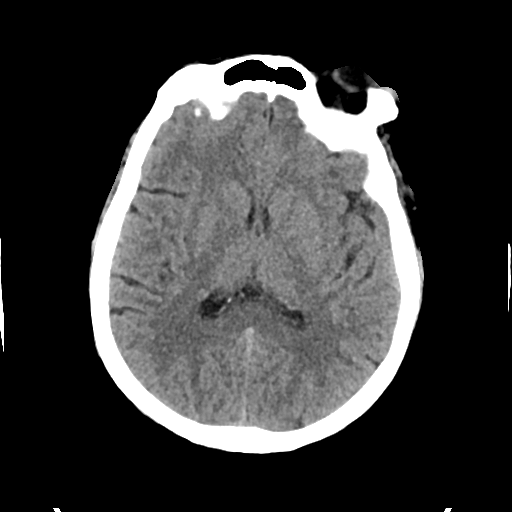
[im 24/34  brain]
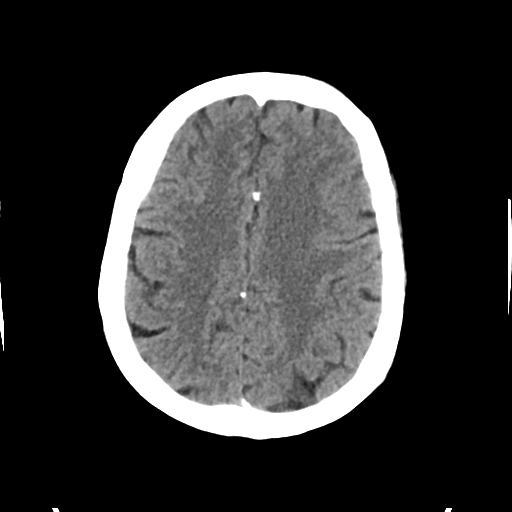
[im 24/34  bone]
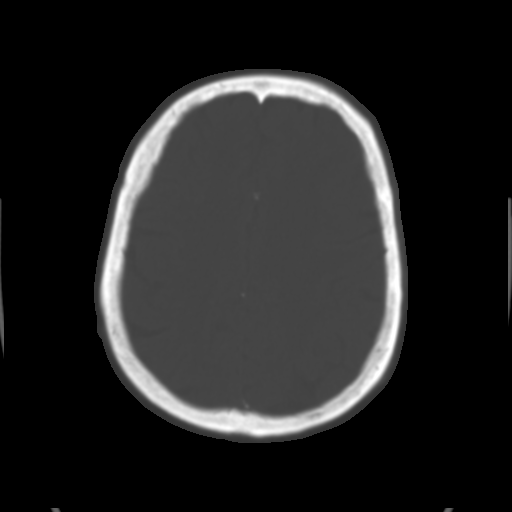
[im 29/34  brain]
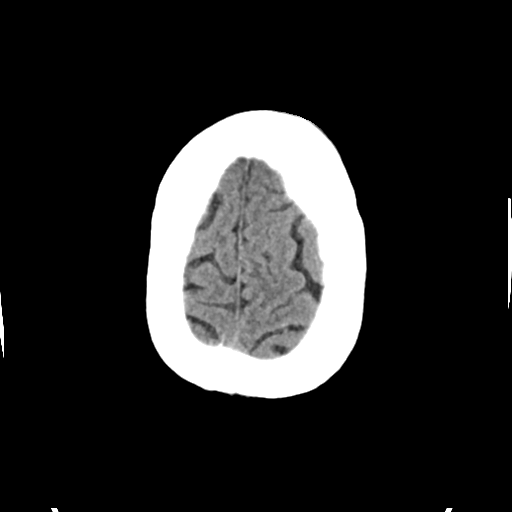

[Series 3: head bone · axial · 0.43mm/px · z∈[+1029,+1111]mm · 5 of 87 slices shown]
[im 9/87  bone]
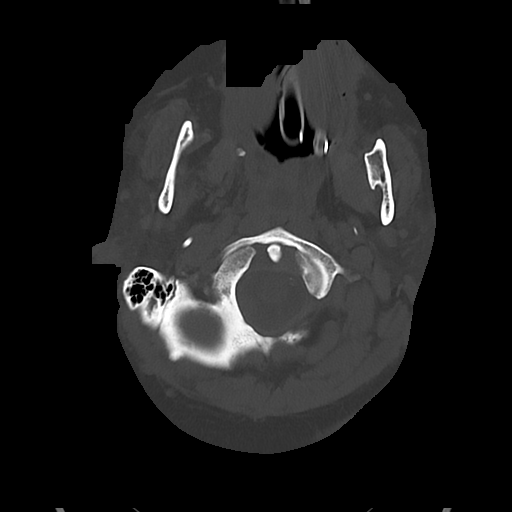
[im 17/87  bone]
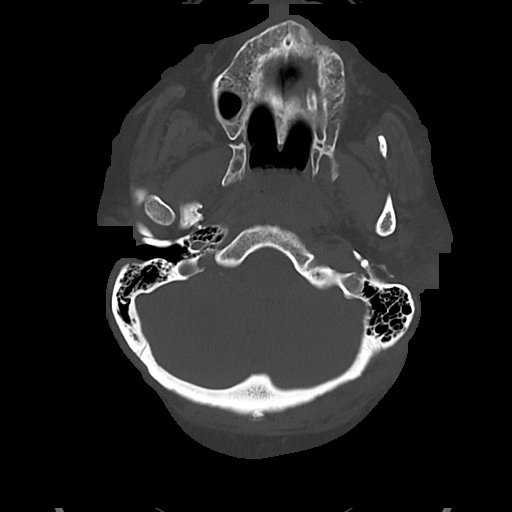
[im 29/87  bone]
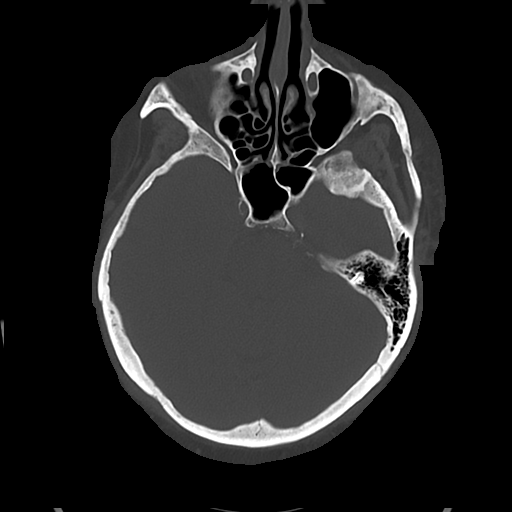
[im 37/87  bone]
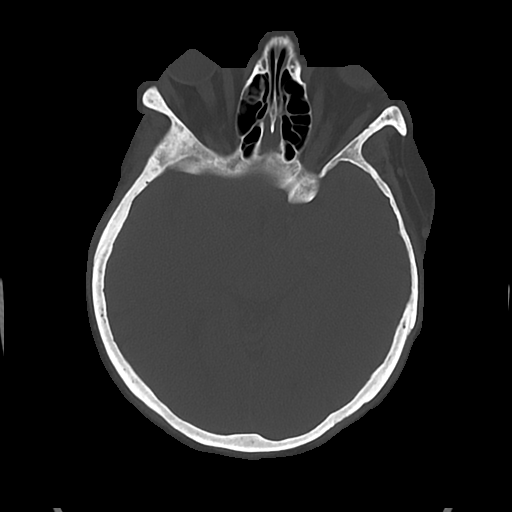
[im 50/87  bone]
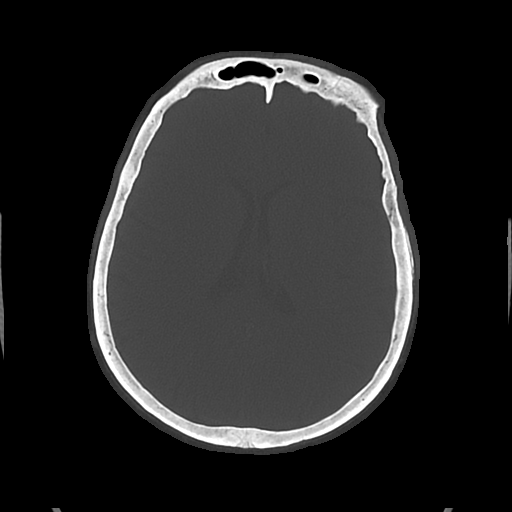

[Series 4: cor soft · coronal · 0.36mm/px · 3 of 72 slices shown]
[im 24/72  brain]
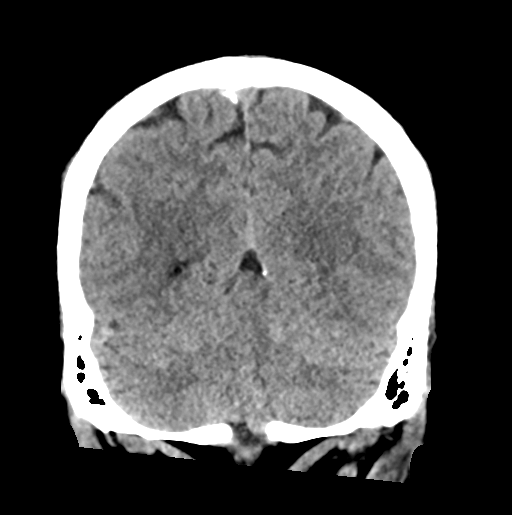
[im 32/72  brain]
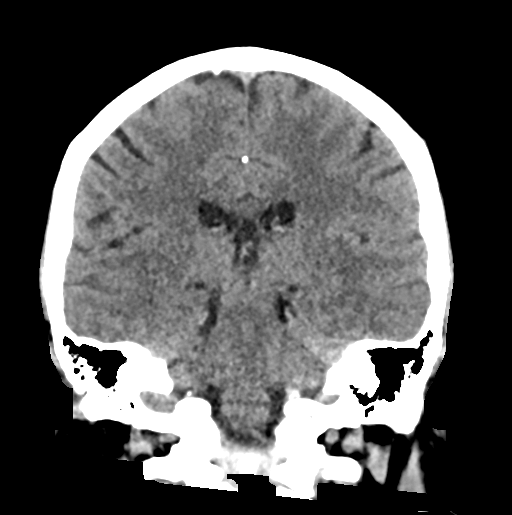
[im 40/72  brain]
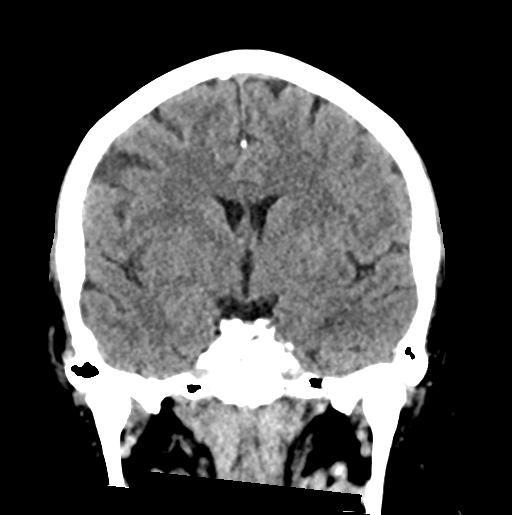

[Series 5: sag soft · sagittal · 0.37mm/px · 3 of 60 slices shown]
[im 20/60  brain]
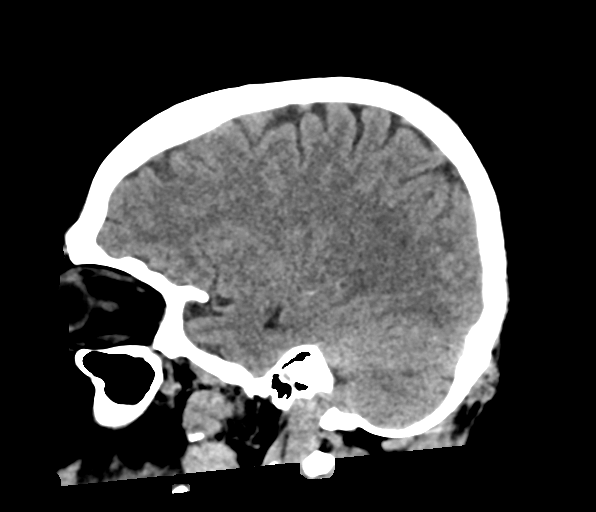
[im 30/60  brain]
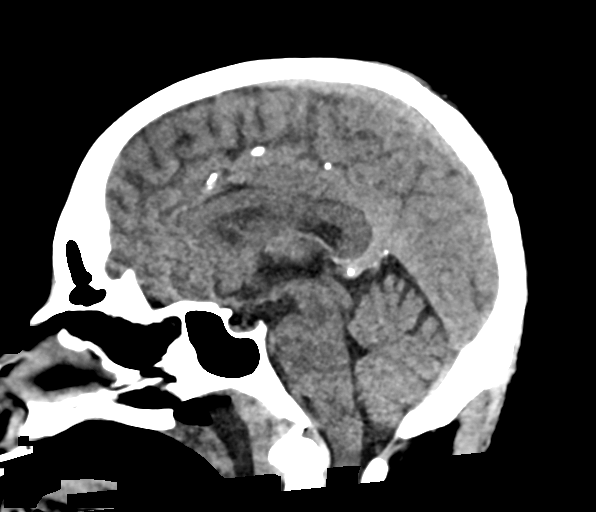
[im 40/60  brain]
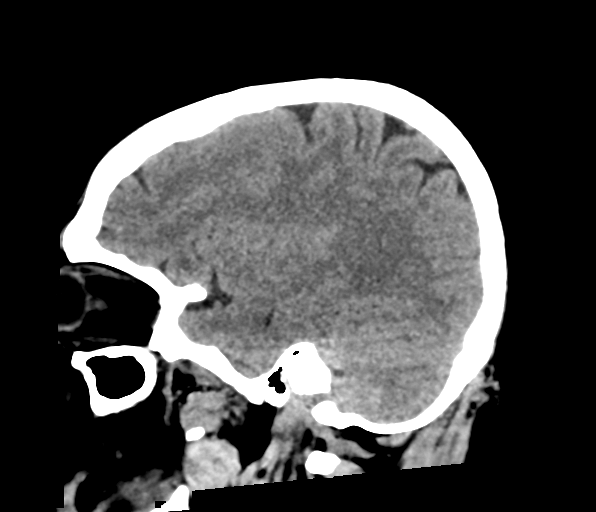

[17 of 47 positions shown; findings below may reference images not displayed]

FINDINGS: Brain: The brain shows a normal appearance without evidence of
malformation, atrophy, old or acute small or large vessel
infarction, mass lesion, hemorrhage, hydrocephalus or extra-axial
collection.

Vascular: No hyperdense vessel. No evidence of atherosclerotic
calcification.

Skull: Normal.  No traumatic finding.  No focal bone lesion.

Sinuses/Orbits: Sinuses are clear. Orbits appear normal. Mastoids
are clear.

Other: None significant
IMPRESSION: Normal head CT

## 2019-11-11 MED ORDER — NOREPINEPHRINE-SODIUM CHLORIDE 16-0.9 MG/250ML-% IV SOLN
0.0000 ug/min | INTRAVENOUS | Status: DC
  Administered 2019-11-11: 65 ug/min via INTRAVENOUS
  Filled 2019-11-11 (×4): qty 250

## 2019-11-11 MED ORDER — LACTATED RINGERS IV SOLN: INTRAVENOUS | Status: DC

## 2019-11-11 MED ORDER — ORAL CARE MOUTH RINSE
15.0000 mL | OROMUCOSAL | Status: DC
  Administered 2019-11-11 – 2019-12-07 (×241): 15 mL via OROMUCOSAL

## 2019-11-11 MED ORDER — NOREPINEPHRINE 4 MG/250ML-% IV SOLN: 0.0000 ug/min | INTRAVENOUS | Status: DC

## 2019-11-11 MED ORDER — ACETAMINOPHEN 650 MG RE SUPP
650.0000 mg | Freq: Once | RECTAL | Status: AC
  Administered 2019-11-11: 650 mg via RECTAL
  Filled 2019-11-11: qty 1

## 2019-11-11 MED ORDER — INSULIN REGULAR(HUMAN) IN NACL 100-0.9 UT/100ML-% IV SOLN
INTRAVENOUS | Status: DC
  Administered 2019-11-11: 11.5 [IU]/h via INTRAVENOUS
  Filled 2019-11-11: qty 100

## 2019-11-11 MED ORDER — VASOPRESSIN 20 UNIT/ML IV SOLN
0.0400 [IU]/min | INTRAVENOUS | Status: DC
  Administered 2019-11-11: 0.03 [IU]/min via INTRAVENOUS
  Administered 2019-11-12 – 2019-11-13 (×2): 0.04 [IU]/min via INTRAVENOUS
  Filled 2019-11-11 (×3): qty 2

## 2019-11-11 MED ORDER — VANCOMYCIN HCL 10 G IV SOLR
2250.0000 mg | Freq: Once | INTRAVENOUS | Status: AC
  Administered 2019-11-11: 2250 mg via INTRAVENOUS
  Filled 2019-11-11: qty 2250

## 2019-11-11 MED ORDER — NOREPINEPHRINE 4 MG/250ML-% IV SOLN
INTRAVENOUS | Status: AC
  Filled 2019-11-11: qty 250

## 2019-11-11 MED ORDER — BUDESONIDE 0.5 MG/2ML IN SUSP
0.5000 mg | Freq: Two times a day (BID) | RESPIRATORY_TRACT | Status: DC
  Administered 2019-11-11 – 2020-01-04 (×107): 0.5 mg via RESPIRATORY_TRACT
  Filled 2019-11-11 (×112): qty 2

## 2019-11-11 MED ORDER — FENTANYL 2500MCG IN NS 250ML (10MCG/ML) PREMIX INFUSION
50.0000 ug/h | INTRAVENOUS | Status: DC
  Administered 2019-11-11 – 2019-11-13 (×3): 100 ug/h via INTRAVENOUS
  Filled 2019-11-11 (×3): qty 250

## 2019-11-11 MED ORDER — VANCOMYCIN VARIABLE DOSE PER UNSTABLE RENAL FUNCTION (PHARMACIST DOSING): Status: DC

## 2019-11-11 MED ORDER — SODIUM CHLORIDE 0.9 % IV BOLUS
1000.0000 mL | Freq: Once | INTRAVENOUS | Status: AC
  Administered 2019-11-11: 1000 mL via INTRAVENOUS

## 2019-11-11 MED ORDER — KETAMINE HCL 50 MG/5ML IJ SOSY
100.0000 mg | PREFILLED_SYRINGE | Freq: Once | INTRAMUSCULAR | Status: AC
  Administered 2019-11-11: 100 mg via INTRAVENOUS
  Filled 2019-11-11: qty 10

## 2019-11-11 MED ORDER — MIDAZOLAM HCL 2 MG/2ML IJ SOLN: 2.0000 mg | INTRAMUSCULAR | Status: DC | PRN

## 2019-11-11 MED ORDER — POTASSIUM CHLORIDE 20 MEQ/15ML (10%) PO SOLN
40.0000 meq | Freq: Once | ORAL | Status: AC
  Administered 2019-11-11: 40 meq
  Filled 2019-11-11: qty 30

## 2019-11-11 MED ORDER — SODIUM CHLORIDE 0.9 % IV SOLN
INTRAVENOUS | Status: DC | PRN
  Administered 2019-11-11 – 2019-11-21 (×5): 250 mL via INTRAVENOUS
  Administered 2019-11-22: 500 mL via INTRAVENOUS
  Administered 2019-11-26: 250 mL via INTRAVENOUS
  Administered 2019-11-30: 500 mL via INTRAVENOUS
  Administered 2019-12-03 – 2019-12-05 (×2): 250 mL via INTRAVENOUS

## 2019-11-11 MED ORDER — LACTATED RINGERS IV BOLUS
1000.0000 mL | Freq: Once | INTRAVENOUS | Status: AC
  Administered 2019-11-11: 1000 mL via INTRAVENOUS

## 2019-11-11 MED ORDER — CHLORHEXIDINE GLUCONATE 0.12% ORAL RINSE (MEDLINE KIT)
15.0000 mL | Freq: Two times a day (BID) | OROMUCOSAL | Status: DC
  Administered 2019-11-11 – 2019-12-06 (×51): 15 mL via OROMUCOSAL

## 2019-11-11 MED ORDER — PROPOFOL 500 MG/50ML IV EMUL
INTRAVENOUS | Status: AC | PRN
  Administered 2019-11-11: 1000 mg via INTRAVENOUS

## 2019-11-11 MED ORDER — BICTEGRAVIR-EMTRICITAB-TENOFOV 50-200-25 MG PO TABS
1.0000 | ORAL_TABLET | Freq: Every day | ORAL | Status: DC
  Administered 2019-11-12: 1 via ORAL
  Filled 2019-11-11 (×2): qty 1

## 2019-11-11 MED ORDER — SODIUM CHLORIDE 0.9 % IV SOLN
3.0000 g | Freq: Three times a day (TID) | INTRAVENOUS | Status: DC
  Administered 2019-11-11 – 2019-11-12 (×2): 3 g via INTRAVENOUS
  Filled 2019-11-11 (×4): qty 8

## 2019-11-11 MED ORDER — PIPERACILLIN-TAZOBACTAM 3.375 G IVPB 30 MIN
3.3750 g | Freq: Once | INTRAVENOUS | Status: AC
  Administered 2019-11-11: 3.375 g via INTRAVENOUS
  Filled 2019-11-11: qty 50

## 2019-11-11 MED ORDER — NOREPINEPHRINE 16 MG/250ML-% IV SOLN
0.0000 ug/min | INTRAVENOUS | Status: DC
  Administered 2019-11-11: 40 ug/min via INTRAVENOUS
  Administered 2019-11-12: 25 ug/min via INTRAVENOUS
  Administered 2019-11-12: 10 ug/min via INTRAVENOUS
  Administered 2019-11-12: 37 ug/min via INTRAVENOUS
  Administered 2019-11-13: 22 ug/min via INTRAVENOUS
  Filled 2019-11-11 (×7): qty 250

## 2019-11-11 MED ORDER — NOREPINEPHRINE 4 MG/250ML-% IV SOLN
0.0000 ug/min | INTRAVENOUS | Status: DC
  Administered 2019-11-11: 15:00:00 20 ug/min via INTRAVENOUS

## 2019-11-11 MED ORDER — VANCOMYCIN HCL IN DEXTROSE 1-5 GM/200ML-% IV SOLN
1000.0000 mg | Freq: Once | INTRAVENOUS | Status: DC
  Filled 2019-11-11: qty 200

## 2019-11-11 MED ORDER — PROPOFOL 1000 MG/100ML IV EMUL
INTRAVENOUS | Status: AC
  Filled 2019-11-11: qty 100

## 2019-11-11 MED ORDER — MAGNESIUM SULFATE 2 GM/50ML IV SOLN
2.0000 g | INTRAVENOUS | Status: AC
  Administered 2019-11-11: 15:00:00 2 g via INTRAVENOUS

## 2019-11-11 MED ORDER — FENTANYL CITRATE (PF) 100 MCG/2ML IJ SOLN: 50.0000 ug | Freq: Once | INTRAMUSCULAR | Status: DC

## 2019-11-11 MED ORDER — MIDAZOLAM HCL 2 MG/2ML IJ SOLN
2.0000 mg | INTRAMUSCULAR | Status: DC | PRN
  Administered 2019-11-11: 2 mg via INTRAVENOUS
  Filled 2019-11-11: qty 2

## 2019-11-11 MED ORDER — HEPARIN SODIUM (PORCINE) 5000 UNIT/ML IJ SOLN
5000.0000 [IU] | Freq: Three times a day (TID) | INTRAMUSCULAR | Status: AC
  Administered 2019-11-11 – 2019-12-06 (×76): 5000 [IU] via SUBCUTANEOUS
  Filled 2019-11-11 (×78): qty 1

## 2019-11-11 MED ORDER — MIDAZOLAM HCL 2 MG/2ML IJ SOLN
INTRAMUSCULAR | Status: AC
  Filled 2019-11-11: qty 4

## 2019-11-11 MED ORDER — ROCURONIUM BROMIDE 50 MG/5ML IV SOLN
INTRAVENOUS | Status: AC | PRN
  Administered 2019-11-11: 100 mg via INTRAVENOUS

## 2019-11-11 MED ORDER — FENTANYL BOLUS VIA INFUSION
50.0000 ug | INTRAVENOUS | Status: DC | PRN
  Administered 2019-11-12 – 2019-11-13 (×3): 50 ug via INTRAVENOUS
  Filled 2019-11-11: qty 50

## 2019-11-11 MED ORDER — BICTEGRAVIR-EMTRICITAB-TENOFOV 50-200-25 MG PO TABS: 1.0000 | ORAL_TABLET | Freq: Every day | ORAL | Status: DC

## 2019-11-11 MED ORDER — CALCIUM GLUCONATE-NACL 2-0.675 GM/100ML-% IV SOLN
2.0000 g | Freq: Once | INTRAVENOUS | Status: AC
  Administered 2019-11-11: 2000 mg via INTRAVENOUS
  Filled 2019-11-11: qty 100

## 2019-11-11 MED ORDER — IPRATROPIUM-ALBUTEROL 0.5-2.5 (3) MG/3ML IN SOLN
3.0000 mL | Freq: Four times a day (QID) | RESPIRATORY_TRACT | Status: DC
  Administered 2019-11-11 – 2019-11-22 (×43): 3 mL via RESPIRATORY_TRACT
  Filled 2019-11-11 (×43): qty 3

## 2019-11-11 MED ORDER — PANTOPRAZOLE SODIUM 40 MG IV SOLR
40.0000 mg | Freq: Every day | INTRAVENOUS | Status: DC
  Administered 2019-11-11 – 2019-11-12 (×2): 40 mg via INTRAVENOUS
  Filled 2019-11-11 (×2): qty 40

## 2019-11-11 NOTE — ED Notes (Signed)
Pt's CBG result was 312. Informed Madison - RN.

## 2019-11-11 NOTE — ED Notes (Signed)
[name]- Sister- would like a pt update- [phone]
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
## 2019-11-11 NOTE — ED Notes (Signed)
Pt's CBG result was 225. Informed [place] - RN.

## 2019-11-11 NOTE — ED Notes (Signed)
Pt's foley site appears fine.

## 2019-11-11 NOTE — Progress Notes (Signed)
[facility] Progress Note Patient Name: Susan [name] DOB: 1960/09/22 MRN: [id number]   Date of Service  11/11/2019  HPI/Events of Note  Lactic Acidosis - Lactic Acid = 5.4 --> 3.9. Lactic Acid is clearing.   eICU Interventions  Continue to trend lactic acid.      Intervention Category Major Interventions: Acid-Base disturbance - evaluation and management  Taeveon Keesling Cornelia Copa 11/11/2019, 9:17 PM

## 2019-11-11 NOTE — Progress Notes (Addendum)
EEG attempted in ER  Pt having other procedures at 1730pm   2nd attempt EEG on 17M at 10pm  Pt having sterile procedure now.  Will attempt again in am tomorrow

## 2019-11-11 NOTE — Progress Notes (Signed)
RT called to pt room for desaturation to the low 80's. Pt suctioned, then suction lavaged, and bag lavaged w/moderate dark tan thick secretions. Pt sats on 100% FIO2 and 8 of PEEP without much improvement in sats. RT performed recruitment maneuver on pt and pt sats after completion were in low to mid 90's. RT will continue to monitor.

## 2019-11-11 NOTE — ED Notes (Signed)
Distal port of right femoral triple lumen only port that is patent at present- dr Sabra Heck made aware

## 2019-11-11 NOTE — Procedures (Signed)
Arterial Catheter Insertion Procedure Note BRITT ALBACH [id number] 1961/07/05  Procedure: Insertion of Arterial Catheter  Indications: Blood pressure monitoring and Frequent blood sampling  Procedure Details Consent: Unable to obtain consent because of emergent medical necessity. Time Out: Verified patient identification, verified procedure, site/side was marked, verified correct patient position, special equipment/implants available, medications/allergies/relevent history reviewed, required imaging and test results available.  Performed  Maximum sterile technique was used including cap, gloves, gown, hand hygiene, mask and sheet. Skin prep: Chlorhexidine; local anesthetic administered 20 gauge catheter was inserted into right radial artery using the Seldinger technique. ULTRASOUND GUIDANCE USED: NO Evaluation Blood flow good; BP tracing good. Complications: No apparent complications.   Roby Lofts Shaleka Brines 11/11/2019

## 2019-11-11 NOTE — ED Notes (Signed)
Daughter Rhoderick Moody would like an update. [phone]

## 2019-11-11 NOTE — Progress Notes (Signed)
All vent changes per MD order. RT will continue to monitor.

## 2019-11-11 NOTE — Progress Notes (Signed)
[place] Progress Note Patient Name: KELLENE RANDOLF DOB: September 06, 1960 MRN: [id number]   Date of Service  11/11/2019  HPI/Events of Note  Fever to 101.3 F - Patient is already cultured and on Abx Rx. AST = 43.   eICU Interventions  Will order: 1. Tylenol Suppository 650 mg PR X 1 now.     Intervention Category Major Interventions: Other:  Lysle Dingwall 11/11/2019, 11:44 PM

## 2019-11-11 NOTE — ED Notes (Signed)
Daughter, Susan [name] would like to get an update. [phone]. If can't be reached then to husband [phone].

## 2019-11-11 NOTE — ED Triage Notes (Signed)
To ED via [facility] from home -- initially called to house for "fall" - found pt next to bed with agonal resp -- began bagging pt-- no palpable radial pulses, weak carotid-- intubated in field with 8.0 ETT--  CBG- 369 Initial BP 82/40

## 2019-11-11 NOTE — Progress Notes (Signed)
Pharmacy Antibiotic Note  Susan [name] is a 59 y.o. female admitted on 11/11/2019 with aspiration pneumonia.  Pharmacy has been consulted for vancomycin and Unasyn dosing.  WBC 4.7, Tmax 102.7, LA 11, Scr 2.1 (BL ~1)  Zosyn received today @ 1610 so will time Unasyn to begin later this evening  Plan: Vancomycin 2250mg  IV x1 then dosed based on renal function in the setting of AKI    -Goal trough 15-20 mcg/mL Unasyn 3g IV Q8h  Height: 5\' 3"  (160 cm) Weight: 251 lb 5.2 oz (114 kg) IBW/kg (Calculated) : 52.4  Temp (24hrs), Avg:101.6 F (38.7 C), Min:100.3 F (37.9 C), Max:102.7 F (39.3 C)  Recent Labs  Lab 11/11/19 1453 11/11/19 1501  WBC 4.7  --   CREATININE 2.46* 2.10*  LATICACIDVEN >11.0*  --     Estimated Creatinine Clearance: 35.9 mL/min (A) (by C-G formula based on SCr of 2.1 mg/dL (H)).    Allergies  Allergen Reactions  . Lyrica [Pregabalin] Swelling    Has LE swelling    Antimicrobials this admission: 3/10 Zosyn x1 3/10 Vanc >>  3/10 Unasyn >>   Dose adjustments this admission: N/A  Microbiology results: 3/10 BCx: sent 3/10 UCx: sent  3/10 trach aspirate: sent   Thank you for allowing pharmacy to be a part of this patient's care.  Kennon Holter, PharmD PGY1 Ambulatory Care Pharmacy Resident 11/11/2019 5:27 PM
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
## 2019-11-12 ENCOUNTER — Other Ambulatory Visit (HOSPITAL_COMMUNITY): Payer: Medicare Other

## 2019-11-12 ENCOUNTER — Inpatient Hospital Stay (HOSPITAL_COMMUNITY): Payer: Medicare Other

## 2019-11-12 DIAGNOSIS — A419 Sepsis, unspecified organism: Secondary | ICD-10-CM

## 2019-11-12 DIAGNOSIS — R6521 Severe sepsis with septic shock: Secondary | ICD-10-CM | POA: Diagnosis not present

## 2019-11-12 DIAGNOSIS — R4182 Altered mental status, unspecified: Secondary | ICD-10-CM

## 2019-11-12 DIAGNOSIS — J9601 Acute respiratory failure with hypoxia: Secondary | ICD-10-CM | POA: Diagnosis not present

## 2019-11-12 LAB — POCT I-STAT 7, (LYTES, BLD GAS, ICA,H+H)
Acid-base deficit: 4 mmol/L — ABNORMAL HIGH (ref 0.0–2.0)
Acid-base deficit: 6 mmol/L — ABNORMAL HIGH (ref 0.0–2.0)
Acid-base deficit: 7 mmol/L — ABNORMAL HIGH (ref 0.0–2.0)
Bicarbonate: 20.8 mmol/L (ref 20.0–28.0)
Bicarbonate: 21.3 mmol/L (ref 20.0–28.0)
Bicarbonate: 23.1 mmol/L (ref 20.0–28.0)
Calcium, Ion: 1.09 mmol/L — ABNORMAL LOW (ref 1.15–1.40)
Calcium, Ion: 1.14 mmol/L — ABNORMAL LOW (ref 1.15–1.40)
Calcium, Ion: 1.17 mmol/L (ref 1.15–1.40)
HCT: 41 % (ref 36.0–46.0)
HCT: 45 % (ref 36.0–46.0)
HCT: 47 % — ABNORMAL HIGH (ref 36.0–46.0)
Hemoglobin: 13.9 g/dL (ref 12.0–15.0)
Hemoglobin: 15.3 g/dL — ABNORMAL HIGH (ref 12.0–15.0)
Hemoglobin: 16 g/dL — ABNORMAL HIGH (ref 12.0–15.0)
O2 Saturation: 93 %
O2 Saturation: 93 %
O2 Saturation: 99 %
Patient temperature: 100.4
Patient temperature: 38.4
Patient temperature: 38.8
Potassium: 3.5 mmol/L (ref 3.5–5.1)
Potassium: 3.9 mmol/L (ref 3.5–5.1)
Potassium: 4 mmol/L (ref 3.5–5.1)
Sodium: 142 mmol/L (ref 135–145)
Sodium: 143 mmol/L (ref 135–145)
Sodium: 143 mmol/L (ref 135–145)
TCO2: 22 mmol/L (ref 22–32)
TCO2: 23 mmol/L (ref 22–32)
TCO2: 25 mmol/L (ref 22–32)
pCO2 arterial: 48.3 mmHg — ABNORMAL HIGH (ref 32.0–48.0)
pCO2 arterial: 50.5 mmHg — ABNORMAL HIGH (ref 32.0–48.0)
pCO2 arterial: 53.7 mmHg — ABNORMAL HIGH (ref 32.0–48.0)
pH, Arterial: 7.214 — ABNORMAL LOW (ref 7.350–7.450)
pH, Arterial: 7.251 — ABNORMAL LOW (ref 7.350–7.450)
pH, Arterial: 7.274 — ABNORMAL LOW (ref 7.350–7.450)
pO2, Arterial: 150 mmHg — ABNORMAL HIGH (ref 83.0–108.0)
pO2, Arterial: 84 mmHg (ref 83.0–108.0)
pO2, Arterial: 87 mmHg (ref 83.0–108.0)

## 2019-11-12 LAB — CBC
HCT: 46.8 % — ABNORMAL HIGH (ref 36.0–46.0)
Hemoglobin: 15 g/dL (ref 12.0–15.0)
MCH: 27.6 pg (ref 26.0–34.0)
MCHC: 32.1 g/dL (ref 30.0–36.0)
MCV: 86.2 fL (ref 80.0–100.0)
Platelets: 177 10*3/uL (ref 150–400)
RBC: 5.43 MIL/uL — ABNORMAL HIGH (ref 3.87–5.11)
RDW: 14.4 % (ref 11.5–15.5)
WBC: 4.3 10*3/uL (ref 4.0–10.5)
nRBC: 0 % (ref 0.0–0.2)

## 2019-11-12 LAB — BASIC METABOLIC PANEL
Anion gap: 13 (ref 5–15)
Anion gap: 14 (ref 5–15)
BUN: 23 mg/dL — ABNORMAL HIGH (ref 6–20)
BUN: 26 mg/dL — ABNORMAL HIGH (ref 6–20)
CO2: 19 mmol/L — ABNORMAL LOW (ref 22–32)
CO2: 20 mmol/L — ABNORMAL LOW (ref 22–32)
Calcium: 7.6 mg/dL — ABNORMAL LOW (ref 8.9–10.3)
Calcium: 7.3 mg/dL — ABNORMAL LOW (ref 8.9–10.3)
Chloride: 109 mmol/L (ref 98–111)
Chloride: 109 mmol/L (ref 98–111)
Creatinine, Ser: 2.28 mg/dL — ABNORMAL HIGH (ref 0.44–1.00)
Creatinine, Ser: 2.51 mg/dL — ABNORMAL HIGH (ref 0.44–1.00)
GFR calc Af Amer: 27 mL/min — ABNORMAL LOW (ref >60)
GFR calc Af Amer: 24 mL/min — ABNORMAL LOW (ref >60)
GFR calc non Af Amer: 23 mL/min — ABNORMAL LOW (ref >60)
GFR calc non Af Amer: 21 mL/min — ABNORMAL LOW (ref >60)
Glucose, Bld: 104 mg/dL — ABNORMAL HIGH (ref 70–99)
Glucose, Bld: 134 mg/dL — ABNORMAL HIGH (ref 70–99)
Potassium: 4 mmol/L (ref 3.5–5.1)
Potassium: 4.1 mmol/L (ref 3.5–5.1)
Sodium: 141 mmol/L (ref 135–145)
Sodium: 143 mmol/L (ref 135–145)

## 2019-11-12 LAB — MAGNESIUM
Magnesium: 1.9 mg/dL (ref 1.7–2.4)
Magnesium: 1.9 mg/dL (ref 1.7–2.4)

## 2019-11-12 LAB — CK
Total CK: 1037 U/L — ABNORMAL HIGH (ref 38–234)
Total CK: 726 U/L — ABNORMAL HIGH (ref 38–234)

## 2019-11-12 LAB — ECHOCARDIOGRAM COMPLETE
Height: 63 in
Weight: 4021.19 oz

## 2019-11-12 LAB — GLUCOSE, CAPILLARY
Glucose-Capillary: 109 mg/dL — ABNORMAL HIGH (ref 70–99)
Glucose-Capillary: 109 mg/dL — ABNORMAL HIGH (ref 70–99)
Glucose-Capillary: 110 mg/dL — ABNORMAL HIGH (ref 70–99)
Glucose-Capillary: 48 mg/dL — ABNORMAL LOW (ref 70–99)
Glucose-Capillary: 72 mg/dL (ref 70–99)
Glucose-Capillary: 84 mg/dL (ref 70–99)
Glucose-Capillary: 85 mg/dL (ref 70–99)
Glucose-Capillary: 91 mg/dL (ref 70–99)
Glucose-Capillary: 93 mg/dL (ref 70–99)
Glucose-Capillary: 96 mg/dL (ref 70–99)
Glucose-Capillary: 97 mg/dL (ref 70–99)
Glucose-Capillary: 99 mg/dL (ref 70–99)

## 2019-11-12 LAB — URINE CULTURE: Culture: NO GROWTH

## 2019-11-12 LAB — LACTIC ACID, PLASMA: Lactic Acid, Venous: 2.8 mmol/L (ref 0.5–1.9)

## 2019-11-12 LAB — CALCIUM, IONIZED: Calcium, Ionized, Serum: 4.5 mg/dL (ref 4.5–5.6)

## 2019-11-12 IMAGING — DX DG CHEST 1V PORT
1 series · 1 of 1 positions shown · non-contrast
Comparison: Chest x-ray [DATE].

CLINICAL DATA: 58-year-old female status post line placement.

EXAM:
PORTABLE CHEST 1 VIEW

[chest ap]
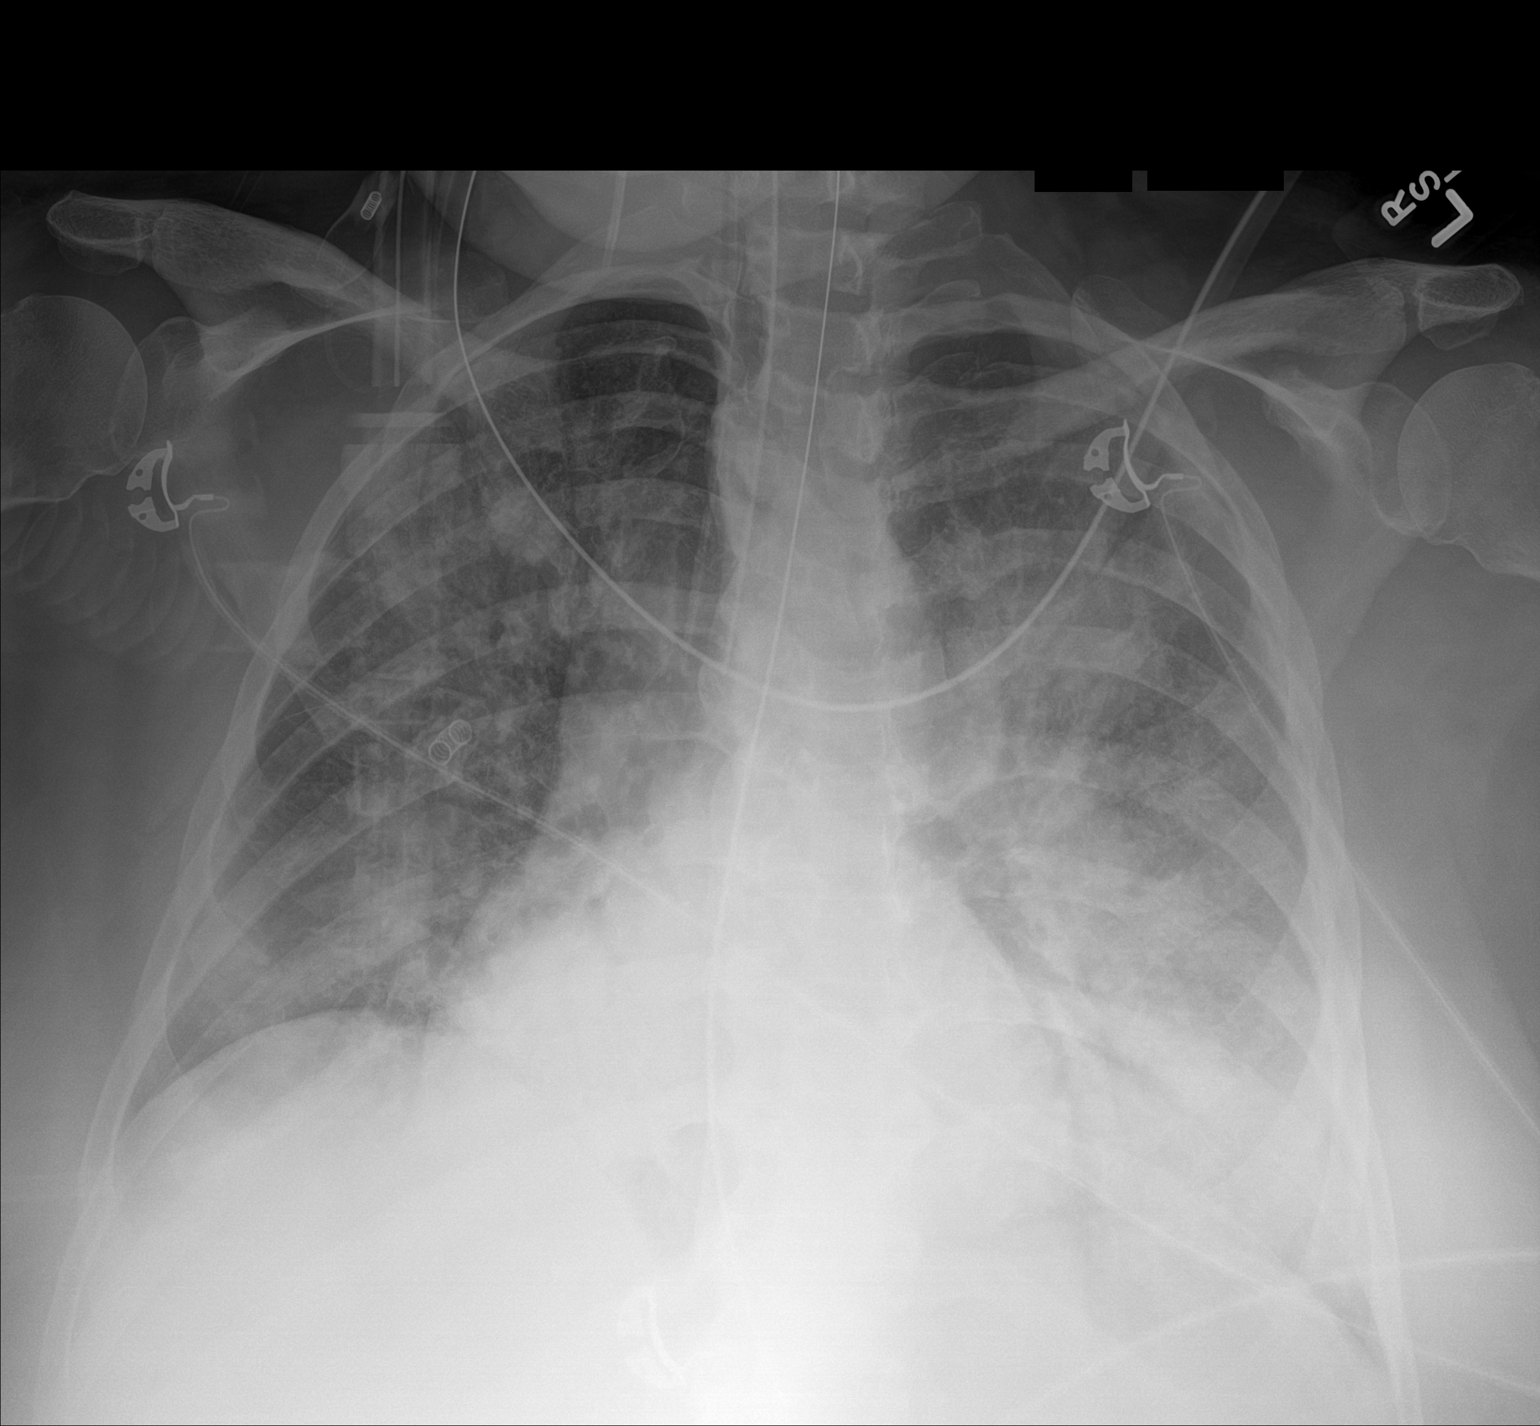

[1 of 1 positions shown; findings below may reference images not displayed]

FINDINGS: An endotracheal tube is in place with tip 1.7 cm above the carina.
There is a right-sided internal jugular central venous catheter with
tip terminating in a high position above the level of the thoracic
inlet on the right. A nasogastric tube is seen extending into the
stomach, however, the tip of the nasogastric tube extends below the
lower margin of the image. Lung volumes are low. Patchy multifocal
areas of airspace consolidation and interstitial prominence
scattered throughout the lungs bilaterally no definite pleural
effusions. Pulmonary vasculature is obscured. Heart size appears
borderline enlarged.
IMPRESSION: 1. Support apparatus, as above. Please take note of the near
complete displacement of the right IJ catheter, and the low position
of the endotracheal tube which is currently 1.7 cm above the carina
(this tube should be withdrawn 3 cm for more optimal placement).
2. Persistent patchy asymmetrically distributed interstitial and
airspace disease in the lungs bilaterally, with slight worsened
aeration compared to the prior study, favored to reflect progressive
multilobar pneumonia.

## 2019-11-12 IMAGING — DX DG CHEST 1V PORT
1 series · 1 of 1 positions shown · non-contrast
Comparison: [DATE]

CLINICAL DATA: Acute respiratory failure with hypoxemia

EXAM:
PORTABLE CHEST 1 VIEW

[chest ap]
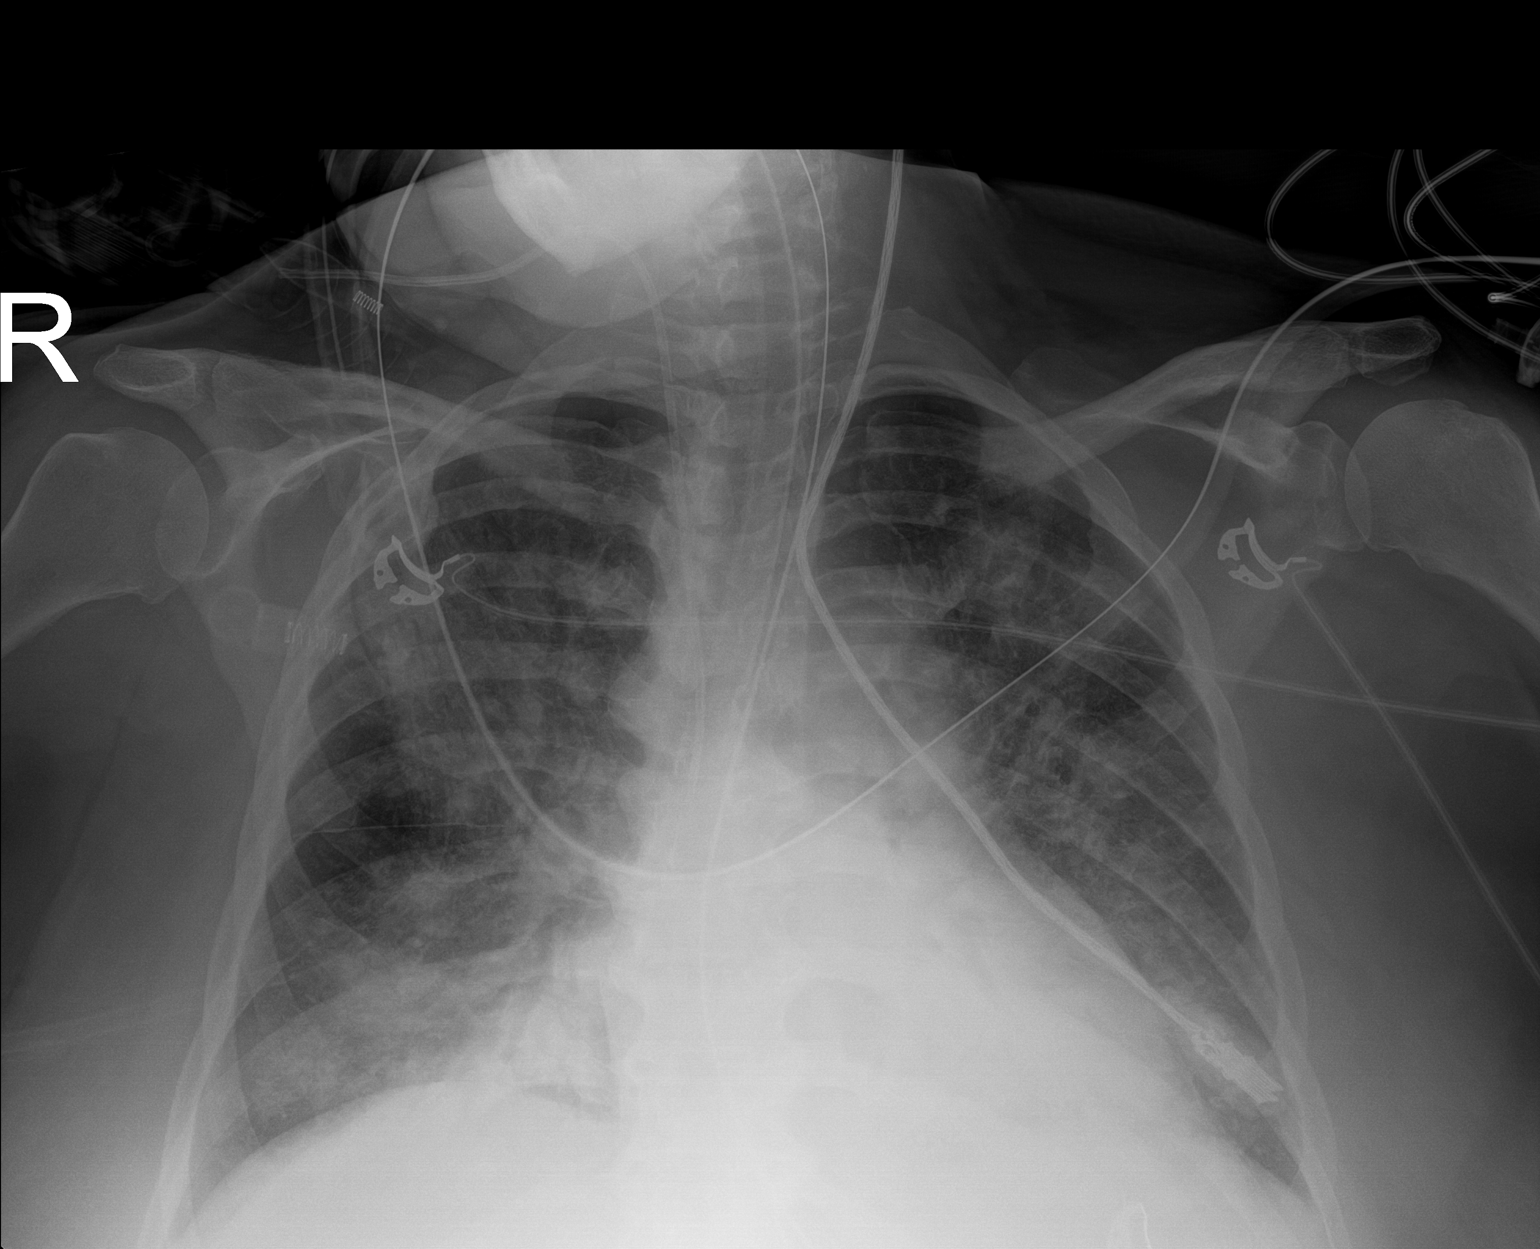

[1 of 1 positions shown; findings below may reference images not displayed]

FINDINGS: The endotracheal tube is 16 mm above the carina.

The NG tube is coursing down the esophagus and into the stomach.

New right IJ central venous catheter tip is in the distal SVC.

The cardiac silhouette, mediastinal and hilar contours are stable.

Persistent diffuse but patchy and asymmetric interstitial and
airspace process in the lungs, likely infection.
IMPRESSION: 1. The endotracheal tube is 16 mm above the carina.
2. New right IJ central venous catheter tip is in the distal SVC.
3. Persistent bilateral infiltrates.

## 2019-11-12 IMAGING — DX DG CHEST 1V PORT
1 series · 1 of 1 positions shown · non-contrast
Comparison: [DATE], [DATE] a.m.

CLINICAL DATA: Central venous line placement

EXAM:
PORTABLE CHEST 1 VIEW

[chest ap]
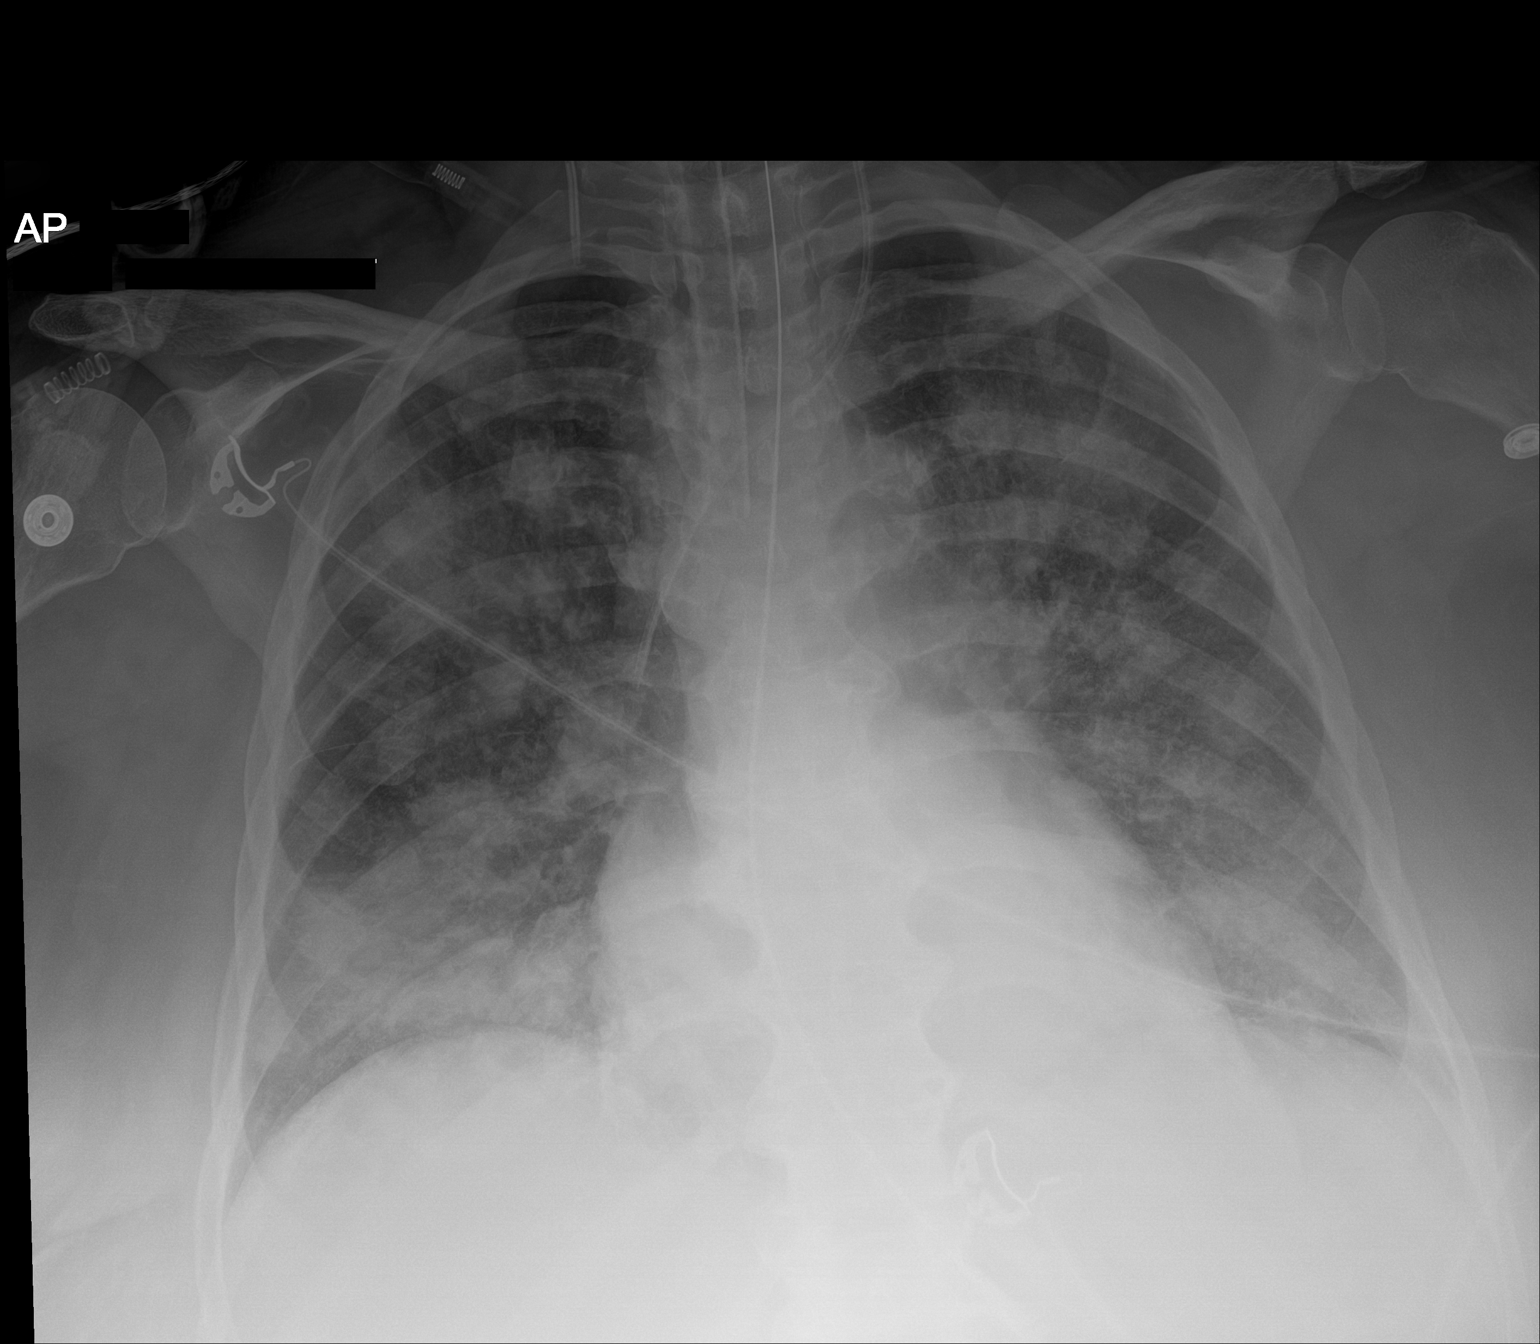

[1 of 1 positions shown; findings below may reference images not displayed]

FINDINGS: Interval placement of left neck vascular catheter, tip projecting
over the mid SVC. Right neck catheter remains in position over the
extrathoracic portion of the jugular vessels. Endotracheal tube has
been slightly retracted, projecting 2.5 cm above the carina.
Esophagogastric tube remains with tip and side port below the
diaphragm. Unchanged extensive bilateral heterogeneous airspace
opacity.
IMPRESSION: 1. Interval placement of left neck vascular catheter, tip projecting
over the mid SVC.
2. Right neck catheter remains in position over the extrathoracic
portion of the jugular vessels.
3. Slight interval retraction of endotracheal tube, projecting
cm above the carina.
4. Unchanged extensive bilateral heterogeneous airspace opacity.

## 2019-11-12 MED ORDER — PIPERACILLIN-TAZOBACTAM 3.375 G IVPB
3.3750 g | Freq: Three times a day (TID) | INTRAVENOUS | Status: DC
  Administered 2019-11-12 – 2019-11-13 (×3): 3.375 g via INTRAVENOUS
  Filled 2019-11-12 (×4): qty 50

## 2019-11-12 MED ORDER — CHLORHEXIDINE GLUCONATE CLOTH 2 % EX PADS
6.0000 | MEDICATED_PAD | Freq: Every morning | CUTANEOUS | Status: AC
  Administered 2019-11-12 – 2019-11-16 (×5): 6 via TOPICAL

## 2019-11-12 MED ORDER — FUROSEMIDE 10 MG/ML IJ SOLN
40.0000 mg | Freq: Once | INTRAMUSCULAR | Status: AC
  Administered 2019-11-12: 40 mg via INTRAVENOUS
  Filled 2019-11-12: qty 4

## 2019-11-12 MED ORDER — LACTATED RINGERS IV BOLUS
500.0000 mL | Freq: Once | INTRAVENOUS | Status: AC
  Administered 2019-11-12: 500 mL via INTRAVENOUS

## 2019-11-12 MED ORDER — DOLUTEGRAVIR SODIUM 50 MG PO TABS
50.0000 mg | ORAL_TABLET | Freq: Every day | ORAL | Status: DC
  Administered 2019-11-13 – 2020-01-09 (×57): 50 mg via ORAL
  Filled 2019-11-12 (×60): qty 1

## 2019-11-12 MED ORDER — MUPIROCIN 2 % EX OINT
1.0000 "application " | TOPICAL_OINTMENT | Freq: Two times a day (BID) | CUTANEOUS | Status: AC
  Administered 2019-11-12 – 2019-11-16 (×10): 1 via NASAL
  Filled 2019-11-12 (×3): qty 22

## 2019-11-12 MED ORDER — VITAL HIGH PROTEIN PO LIQD
1000.0000 mL | ORAL | Status: DC
  Administered 2019-11-12 – 2019-11-19 (×8): 1000 mL

## 2019-11-12 MED ORDER — DEXTROSE 10 % IV SOLN: INTRAVENOUS | Status: DC

## 2019-11-12 MED ORDER — LACTATED RINGERS IV BOLUS
1000.0000 mL | Freq: Once | INTRAVENOUS | Status: AC
  Administered 2019-11-12: 1000 mL via INTRAVENOUS

## 2019-11-12 MED ORDER — SODIUM CHLORIDE 0.9 % IV BOLUS
500.0000 mL | Freq: Once | INTRAVENOUS | Status: AC
  Administered 2019-11-12: 500 mL via INTRAVENOUS

## 2019-11-12 MED ORDER — EMTRICITABINE-TENOFOVIR AF 200-25 MG PO TABS
1.0000 | ORAL_TABLET | Freq: Every day | ORAL | Status: DC
  Administered 2019-11-13 – 2020-01-06 (×55): 1
  Filled 2019-11-12 (×56): qty 1

## 2019-11-12 NOTE — Progress Notes (Signed)
RT obtained ABG on pt per order with the following results. RT will continue to monitor.   Results for Susan [name], Susan [name] (MRN [id number]) as of 11/12/2019 05:29  Ref. Range 11/12/2019 05:26  Sample type Unknown ARTERIAL  pH, Arterial Latest Ref Range: 7.350 - 7.450  7.251 (L)  pCO2 arterial Latest Ref Range: 32.0 - 48.0 mmHg 48.3 (H)  pO2, Arterial Latest Ref Range: 83.0 - 108.0 mmHg 84.0  TCO2 Latest Ref Range: 22 - 32 mmol/L 22  Acid-base deficit Latest Ref Range: 0.0 - 2.0 mmol/L 6.0 (H)  Bicarbonate Latest Ref Range: 20.0 - 28.0 mmol/L 20.8  O2 Saturation Latest Units: % 93.0  Patient temperature Unknown 38.8 C  Collection site Unknown ARTERIAL LINE

## 2019-11-12 NOTE — Progress Notes (Signed)
[facility] Progress Note Patient Name: Susan [name] DOB: 1961-07-13 MRN: [id number]   Date of Service  11/12/2019  HPI/Events of Note  Oliguria - Urine output for last 6 hours = 100 mL. Bladder scan with 0 mL residual. CVP = 13. BP = 90/74 with MAP = 79. Suspect ARF/ATN d/t sepsis/septic shock. K+ = 3.5.   eICU Interventions  Will order: 1. 0.9 NaCL 500 mL over 30 minutes now. 2. Lasix 40 mg IV now.      Intervention Category Major Interventions: Other:  Dilcia Rybarczyk Cornelia Copa 11/12/2019, 3:01 AM

## 2019-11-12 NOTE — Progress Notes (Signed)
RT obtained ABG as ordered with the following test results. No changes at this time. RT will continue to monitor.   Results for Susan [name], Susan [name] (MRN [id number]) as of 11/12/2019 00:19  Ref. Range 11/12/2019 00:17  Sample type Unknown ARTERIAL  pH, Arterial Latest Ref Range: 7.350 - 7.450  7.214 (L)  pCO2 arterial Latest Ref Range: 32.0 - 48.0 mmHg 53.7 (H)  pO2, Arterial Latest Ref Range: 83.0 - 108.0 mmHg 87.0  TCO2 Latest Ref Range: 22 - 32 mmol/L 23  Acid-base deficit Latest Ref Range: 0.0 - 2.0 mmol/L 7.0 (H)  Bicarbonate Latest Ref Range: 20.0 - 28.0 mmol/L 21.3  O2 Saturation Latest Units: % 93.0  Patient temperature Unknown 38.4 C  Collection site Unknown ARTERIAL LINE

## 2019-11-12 NOTE — Progress Notes (Signed)
Pharmacy Antibiotic Note  Susan [name] is a 59 y.o. female admitted on 11/11/2019 with AMS and respiratory failure. Pharmacy has been consulted to transition from Unasyn to [place].  Patient continues on vancomycin.  SCr 2.08, CrCL 33 ml/min, Tmax 102.2, WBC WNL, LA down 2.8.  Plan: No standing vanc order  Zosyn EID 3.375gm IV Q8H Monitor renal fxn, clinical progress, vanc level as indicated  Height: 5\' 3"  (160 cm) Weight: 251 lb 5.2 oz (114 kg) IBW/kg (Calculated) : 52.4  Temp (24hrs), Avg:101.1 F (38.4 C), Min:99.2 F (37.3 C), Max:102.7 F (39.3 C)  Recent Labs  Lab 11/11/19 1453 11/11/19 1501 11/11/19 1738 11/11/19 2029 11/11/19 2323 11/11/19 2332 11/12/19 0441 11/12/19 0830  WBC 4.7  --   --   --   --   --  4.3  --   CREATININE 2.46* 2.10* 2.05*  --  2.08*  --   --  2.28*  LATICACIDVEN >11.0*  --  5.4* 3.9*  --  2.8*  --   --     Estimated Creatinine Clearance: 33.1 mL/min (A) (by C-G formula based on SCr of 2.28 mg/dL (H)).    Allergies  Allergen Reactions  . Lyrica [Pregabalin] Swelling    Has LE swelling    Vanc 3/10 >> Unasyn 3/10 >> 3/11 Zosyn x1 3/10, 3/11 >>  3/10 Vanc 2250mg  at 1734  3/10 covid / flu - negative 3/10 MRSA PCR - positive 3/10BCx - 1/4 GPC 3/10UCx -  3/10 TA -   Eshika Reckart D. Mina Marble, PharmD, BCPS, [place] 11/12/2019, 1:57 PM

## 2019-11-12 NOTE — Progress Notes (Signed)
NAME:  Susan [name], MRN:  [id number], DOB:  04-Jul-1961, LOS: 1 ADMISSION DATE:  11/11/2019, CONSULTATION DATE:  3/10 REFERRING MD:  Sabra Heck, CHIEF COMPLAINT:  Acute encephalopathy and respiratory failure    Brief History   59 year old female with history of schizophrenia, HIV, diabetes with diabetic neuropathy and falls at home.  EMS called to the patient's house after what was called out as a fall.  Upon arrival the patient was unresponsive with agonal respiratory pattern.  She was intubated by EMS, hypotensive on arrival to ER.  Admitted with a working diagnosis of acute toxic/metabolic encephalopathy felt possibly secondary to overdose complicated by aspiration pneumonia and septic shock  History of present illness   59 year old female, was found at bedside after what was felt to be a fall.  She was  unresponsive with agonal respiratory effort.  She was intubated by EMS, did not respond to IV Narcan, transported to [facility] emergency room. Her blood glucose on arrival to ER , Lactic acid: 312 greater than 11, ethanol level less than 10, salicylate level less than 7, creatinine 2.1 calcium 0.98 portable chest x-ray showing endotracheal tube in satisfactory position with bilateral patchy pulmonary infiltrates right greater than left.  She remained encephalopathic, following arrival to the emergency room.  She would reach up with the left arm for the endotracheal tube, she was significantly hypotensive, this became even worse when propofol infusion was initiated.  Critical care asked to admit.  Initial QTC 427 ms  Past Medical History  Frequent falls at home,Tobacco abuse, diabetes with painful polyneuropathy, bipolar disease, GERD, HIV, schizophrenia, history of IV drug abuse, seizure disorder.   Significant Hospital Events   3/10: EMS summoned to house.  Intubated on arrival, hypotensive on arrival to emergency room  Consults:    Procedures:  Oral endotracheal tube 3/10 Right femoral  triple-lumen catheter 3/10 (ER)  right internal jugular vein triple-lumen catheter 3/10  Significant Diagnostic Tests:  CT brain 3/10>>> EEG 3/10>>>Moderate to severe diffuse encephalopathy. No seizures Urine drug screen 3/10>>> 3/10 acetaminophen less than 10, salicylate level less than 7 Ethanol level less than 10  Micro Data:  Respiratory culture 3/10 Blood culture 3/10 CD4 3/10  Antimicrobials:  Vancomycin 3/10 unasyn 3/10-3/11 Zosyn 3/11  Interim history/subjective:  Febrile with Tmax 102.7 in the last 24 hours. Non-purposeful movemnts  Objective   Blood pressure 103/65, pulse 86, temperature (!) 100.9 F (38.3 C), resp. rate (!) 27, height 5\' 3"  (1.6 m), weight 114 kg, SpO2 (!) 79 %. CVP:  [12 mmHg-22 mmHg] 12 mmHg  Vent Mode: PCV FiO2 (%):  [10 %-100 %] 100 % Set Rate:  [14 bmp-32 bmp] 32 bmp Vt Set:  [410 mL-600 mL] 410 mL PEEP:  [5 cmH20-10 cmH20] 10 cmH20 Plateau Pressure:  [25 cmH20-28 cmH20] 26 cmH20   Intake/Output Summary (Last 24 hours) at 11/12/2019 1300 Last data filed at 11/12/2019 1000 Gross per 24 hour  Intake 6796.83 ml  Output 675 ml  Net 6121.83 ml   Filed Weights   11/11/19 1454  Weight: 114 kg    Examination: General: 59 year old black female currently on mechanical ventilation, not following commands, occasionally reaching for endotracheal tube HENT: Pupils equal reactive mucous membranes dry orally intubated Lungs: Diffuse rales and rhonchi, no accessory use currently Cardiovascular: Regular rate and rhythm mildly tachycardic with heart rate in the 120s Abdomen: Soft not tender hypoactive Extremities: Cool, pulses thready, no edema Neuro: Opening eyes to painful stimulus, reaching with upper extremities GU: Due  to void  Resolved Hospital Problem list     Assessment & Plan:   Acute toxic and metabolic encephalopathy.  She has a history of schizophrenia & seizure disorder. , Looking at her home medications Abilify, Cogentin, doxepin,  Neurontin and BuSpar all on her medicine profile.  Wonder about either breakthrough seizure or unintentional versus intentional overdose -EEG - no seizure activity -Serum ethanol, tylenol and salicylate unremarkable -UDS + cannabinol Plan Continue home AEDs Neuro checks Supportive care  ARDS/acute hypoxic and hypercarbic respiratory failure in the setting of what is likely aspiration pneumonia Plan Full ventilator support Broaden broad spectrum antibiotics: Vanc, Zosyn VAP bundle PAD protocol, RASS goal -1 to -2 CXR PRN  History of asthma Plan Continue duoneb and Pulmicort  Septic shock.  Suspect secondary to aspiration pneumonia.   Plan Wean levophed and vasopressin for MAP goal >65 Trend LA Continue broad spectrum antibiotics Follow-up culture data TTE  Rhabd Trend CK IVF as above  Hyperglycemia w/ known DM and known polyeuropathy  Plan Evaluating for DKA as above Hyperglycemia protocol   Acute renal failure: Suspect 2/2 shock Plan Cont vasoactive gtts MAP > 65 Strict I&O Renal dose meds Am chemistry   Mildly elevated INR - ?hepatic congestion   Best practice:  Diet: NPO Pain/Anxiety/Delirium protocol (if indicated): 3/10 VAP protocol (if indicated): 3/10 DVT prophylaxis: scd and then add heparin [place] if CT head nebg GI prophylaxis: PPI Glucose control: ssi Mobility: BR Code Status: full code  Family Communication: updated sister at bedside 3/11 Disposition: to ICU   Labs   CBC: Recent Labs  Lab 11/11/19 1453 11/11/19 1501 11/11/19 1629 11/11/19 2125 11/12/19 0017 11/12/19 0441 11/12/19 0526  WBC 4.7  --   --   --   --  4.3  --   NEUTROABS 1.4*  --   --   --   --   --   --   HGB 16.4*   < > 14.6 16.3* 16.0* 15.0 15.3*  HCT 53.2*   < > 43.0 48.0* 47.0* 46.8* 45.0  MCV 91.9  --   --   --   --  86.2  --   PLT 262  --   --   --   --  177  --    < > = values in this interval not displayed.    Basic Metabolic Panel: Recent Labs  Lab  11/11/19 1453 11/11/19 1453 11/11/19 1501 11/11/19 1629 11/11/19 1738 11/11/19 1738 11/11/19 2125 11/11/19 2323 11/12/19 0017 11/12/19 0441 11/12/19 0526 11/12/19 0830 11/12/19 0833  NA 140   < > 139   < > 140   < > 143 141 143  --  143 141  --   K 3.7   < > 3.4*   < > 3.1*   < > 3.3* 3.5 3.5  --  3.9 4.0  --   CL 101  --  102  --  105  --   --  112*  --   --   --  109  --   CO2 17*  --   --   --  20*  --   --  19*  --   --   --  19*  --   GLUCOSE 324*  --  303*  --  270*  --   --  104*  --   --   --  104*  --   BUN 15  --  15  --  14  --   --  19  --   --   --  23*  --   CREATININE 2.46*  --  2.10*  --  2.05*  --   --  2.08*  --   --   --  2.28*  --   CALCIUM 8.4*  --   --   --  8.1*  --   --  7.9*  --   --   --  7.6*  --   MG 1.9  --   --   --   --   --   --   --   --  1.9  --   --  1.9  PHOS 4.6  --   --   --   --   --   --   --   --   --   --   --   --    < > = values in this interval not displayed.   GFR: Estimated Creatinine Clearance: 33.1 mL/min (A) (by C-G formula based on SCr of 2.28 mg/dL (H)). Recent Labs  Lab 11/11/19 1453 11/11/19 1738 11/11/19 2029 11/11/19 2332 11/12/19 0441  WBC 4.7  --   --   --  4.3  LATICACIDVEN >11.0* 5.4* 3.9* 2.8*  --     Liver Function Tests: Recent Labs  Lab 11/11/19 1453  AST 43*  ALT 18  ALKPHOS 89  BILITOT 0.4  PROT 5.5*  ALBUMIN 2.4*   No results for input(s): LIPASE, AMYLASE in the last 168 hours. No results for input(s): AMMONIA in the last 168 hours.  ABG    Component Value Date/Time   PHART 7.251 (L) 11/12/2019 0526   PCO2ART 48.3 (H) 11/12/2019 0526   PO2ART 84.0 11/12/2019 0526   HCO3 20.8 11/12/2019 0526   TCO2 22 11/12/2019 0526   ACIDBASEDEF 6.0 (H) 11/12/2019 0526   O2SAT 93.0 11/12/2019 0526     Coagulation Profile: Recent Labs  Lab 11/11/19 1453  INR 1.3*    Cardiac Enzymes: Recent Labs  Lab 11/11/19 1453 11/12/19 0441  CKTOTAL 868* 1,037*    HbA1C: Hgb A1c MFr Bld  Date/Time  Value Ref Range Status  11/11/2019 05:38 PM 12.3 (H) 4.8 - 5.6 % Final    Comment:    (NOTE) Pre diabetes:          5.7%-6.4% Diabetes:              >6.4% Glycemic control for   <7.0% adults with diabetes   02/17/2019 03:09 AM 9.5 (H) 4.8 - 5.6 % Final    Comment:    (NOTE) Pre diabetes:          5.7%-6.4% Diabetes:              >6.4% Glycemic control for   <7.0% adults with diabetes     CBG: Recent Labs  Lab 11/12/19 0553 11/12/19 0632 11/12/19 0804 11/12/19 0807 11/12/19 1146  GLUCAP 84 91 48* 96 110*    Critical care time: 36 minutes     The patient is critically ill with multiple organ systems failure and requires high complexity decision making for assessment and support, frequent evaluation and titration of therapies, application of advanced monitoring technologies and extensive interpretation of multiple databases.   Critical Care Time devoted to patient care services described in this note is 36 Minutes.   Rodman Pickle, M.D. [facility] Pulmonary/Critical Care Medicine 11/12/2019 1:00 PM   Please see Amion for pager number to reach on-call Pulmonary and Critical  Care Team.

## 2019-11-12 NOTE — Progress Notes (Signed)
  Echocardiogram 2D Echocardiogram has been performed.  Susan [name] 11/12/2019, 4:46 PM

## 2019-11-12 NOTE — Procedures (Signed)
Patient Name: Susan [name]  MRN: [id number]  Epilepsy Attending: Lora Havens  Referring Physician/Provider: Salvadore Dom, NP Date: 11/12/2019 Duration: 23.20 minutes  Patient history: 59 year old female presented with altered mental status.  EEG to evaluate for seizures.  Level of alertness: Comatose  AEDs during EEG study: None  Technical aspects: This EEG study was done with scalp electrodes positioned according to the 10-20 International system of electrode placement. Electrical activity was acquired at a sampling rate of 500Hz  and reviewed with a high frequency filter of 70Hz  and a low frequency filter of 1Hz . EEG data were recorded continuously and digitally stored.   Description: EEG showed continuous generalized high amplitude 3 to 5 Hz theta-delta slowing, at times with triphasic morphology.  Hyperventilation and photic stimulation were not performed.  Abnormality - Continuous low, generalized - Triphasic waves, generalized  IMPRESSION: This study is suggestive of moderate to severe diffuse encephalopathy, nonspecific to etiology but could be secondary to toxic metabolic causes. No seizures or epileptiform discharges were seen throughout the recording.    Tarus Briski [name]

## 2019-11-12 NOTE — Progress Notes (Signed)
EEG completed, results pending. 

## 2019-11-12 NOTE — Progress Notes (Signed)
  PEEP recently increased with improvement in saturations. Notified of worsening ABG- 7.108/ 72.3/ 71/ 22.6 Currently on PRVC rate 20, PEEP 8, TV at 8 cc/kg, and PEEP 10  Noted to have significant dyssynchrony, not much improved with sedation, appears agonal  Changed to PCV with improvement in synchrony, rate increased to maximize MVe and goal TV around her 8cc/kg  P:  Repeat ABG in 1 hour Aline ordered    Kennieth Rad, MSN, AGACNP-BC [place] [facility] 11/12/2019, 12:36 AM

## 2019-11-12 NOTE — Progress Notes (Signed)
[place] Progress Note Patient Name: Susan [name] DOB: [birth year] MRN: [id number]   Date of Service  11/12/2019  HPI/Events of Note  Blood glucose = 93 --> 84 --> 91. She has been off the insulin IV infusion for the last several hours. I suspect that her renal function has dramatically changed. Patient is not on enteral nutrition or IV dextrose.   eICU Interventions  Will order: 1. D10W IV infusion to run IV at 20 mL hour.      Intervention Category Major Interventions: Hyperglycemia - active titration of insulin therapy  Lysle Dingwall 11/12/2019, 6:41 AM

## 2019-11-12 NOTE — Progress Notes (Signed)
[place] Progress Note Patient Name: Susan [name] DOB: [birth year] MRN: [id number]   Date of Service  11/12/2019  HPI/Events of Note  ABG on 100%/PC 18/Rate 24/P 10 = 7.21/53.7/87.0  eICU Interventions  Will order: 1. Increase rate to 32. 2. Repeat ABG at 5 AM.      Intervention Category Major Interventions: Acid-Base disturbance - evaluation and management;Respiratory failure - evaluation and management  Jakaya Jacobowitz [place] 11/12/2019, 3:12 AM

## 2019-11-12 NOTE — Procedures (Signed)
Central Venous Catheter Insertion Procedure Note Susan [name] [id number] 04-Mar-1961  Procedure: Insertion of Central Venous Catheter Indications: Assessment of intravascular volume, Drug and/or fluid administration and Frequent blood sampling  Procedure Details Consent: Risks of procedure as well as the alternatives and risks of each were explained to the (patient/caregiver).  Consent for procedure obtained. Time Out: Verified patient identification, verified procedure, site/side was marked, verified correct patient position, special equipment/implants available, medications/allergies/relevent history reviewed, required imaging and test results available.  Performed  Maximum sterile technique was used including antiseptics, cap, gloves, gown, hand hygiene, mask and sheet. Skin prep: Chlorhexidine; local anesthetic administered A antimicrobial bonded/coated triple lumen catheter was placed in the left internal jugular vein using the Seldinger technique.  Evaluation Blood flow good Complications: No apparent complications Patient did tolerate procedure well. Chest X-ray ordered to verify placement.  CXR: pending.  Susan [name] 11/12/2019, 12:59 PM

## 2019-11-12 NOTE — Progress Notes (Signed)
Initial Nutrition Assessment  DOCUMENTATION CODES:   Morbid obesity  INTERVENTION:    Vital High Protein at 60 ml/h (1440 ml per day)   Provides 1440 kcal, 126 gm protein, 1204 ml free water daily  NUTRITION DIAGNOSIS:   Inadequate oral intake related to inability to eat as evidenced by NPO status.  GOAL:   Provide needs based on ASPEN/SCCM guidelines  MONITOR:   Vent status, TF tolerance, I & O's, Labs  REASON FOR ASSESSMENT:   Ventilator, Consult Enteral/tube feeding initiation and management  ASSESSMENT:   59 yo female admitted S/P fall at home, unresponsive, hypotensive. Intubated by EMS. Suspected overdose with toxic/metabolic encephalopathy and aspiration PNA. PMH includes schizophrenia, HIV, DM, neuropathy, HTN.   Received MD Consult for TF initiation and management.  Patient is currently intubated on ventilator support MV: 14 L/min Temp (24hrs), Avg:101.1 F (38.4 C), Min:99.2 F (37.3 C), Max:102.7 F (39.3 C)   Labs reviewed. Creat 2.28 (H) CBG's: 48-96-110  Medications reviewed and include levophed, vasopressin.   NUTRITION - FOCUSED PHYSICAL EXAM:    Most Recent Value  Orbital Region  No depletion  Upper Arm Region  No depletion  Thoracic and Lumbar Region  No depletion  Buccal Region  Unable to assess  Temple Region  No depletion  Clavicle Bone Region  No depletion  Clavicle and Acromion Bone Region  No depletion  Scapular Bone Region  No depletion  Dorsal Hand  No depletion  Patellar Region  No depletion  Anterior Thigh Region  No depletion  Posterior Calf Region  No depletion  Edema (RD Assessment)  None  Hair  Reviewed  Eyes  Unable to assess  Mouth  Unable to assess  Skin  Reviewed  Nails  Reviewed       Diet Order:   Diet Order            Diet NPO time specified  Diet effective now              EDUCATION NEEDS:   Not appropriate for education at this time  Skin:  Skin Assessment: Reviewed RN Assessment  Last  BM:  PTA  Height:   Ht Readings from Last 1 Encounters:  11/11/19 5\' 3"  (1.6 m)    Weight:   Wt Readings from Last 1 Encounters:  11/11/19 114 kg    Ideal Body Weight:  52.3 kg  BMI:  Body mass index is 44.52 kg/m.  Estimated Nutritional Needs:   Kcal:  1250-1600  Protein:  105-130 gm  Fluid:  >/= 1.6 L    Molli Barrows, RD, LDN, CNSC Please refer to Amion for contact information.

## 2019-11-12 NOTE — Progress Notes (Addendum)
Inpatient Diabetes Program Recommendations  AACE/ADA: New Consensus Statement on Inpatient Glycemic Control (2015)  Target Ranges:  Prepandial:   less than 140 mg/dL      Peak postprandial:   less than 180 mg/dL (1-2 hours)      Critically ill patients:  140 - 180 mg/dL   Lab Results  Component Value Date   GLUCAP 96 11/12/2019   HGBA1C 12.3 (H) 11/11/2019    Review of Glycemic Control  Diabetes history: type 2 Outpatient Diabetes medications: NPH 130 units daily (last taken on 11/10/19 per medication list) Current orders for Inpatient glycemic control: IV insulin  Inpatient Diabetes Program Recommendations:   Noted that IV insulin was stopped last night around 2300 due to blood sugars less than 100 mg/dl.   Recommend starting ICU hyperglycemia order set 1-3 units every 4 hours. Will continue to monitor blood sugars while in the hospital and give recommendations when needed.  Harvel Ricks RN BSN CDE Diabetes Coordinator Pager: [phone]  8am-5pm

## 2019-11-13 ENCOUNTER — Other Ambulatory Visit: Payer: Self-pay

## 2019-11-13 DIAGNOSIS — B953 Streptococcus pneumoniae as the cause of diseases classified elsewhere: Secondary | ICD-10-CM | POA: Diagnosis present

## 2019-11-13 DIAGNOSIS — J159 Unspecified bacterial pneumonia: Secondary | ICD-10-CM | POA: Diagnosis not present

## 2019-11-13 DIAGNOSIS — J9601 Acute respiratory failure with hypoxia: Secondary | ICD-10-CM | POA: Diagnosis not present

## 2019-11-13 DIAGNOSIS — G9341 Metabolic encephalopathy: Secondary | ICD-10-CM | POA: Diagnosis not present

## 2019-11-13 DIAGNOSIS — J80 Acute respiratory distress syndrome: Secondary | ICD-10-CM

## 2019-11-13 DIAGNOSIS — R7881 Bacteremia: Secondary | ICD-10-CM | POA: Diagnosis present

## 2019-11-13 LAB — CBC
HCT: 39.3 % (ref 36.0–46.0)
Hemoglobin: 13 g/dL (ref 12.0–15.0)
MCH: 28 pg (ref 26.0–34.0)
MCHC: 33.1 g/dL (ref 30.0–36.0)
MCV: 84.5 fL (ref 80.0–100.0)
Platelets: 138 10*3/uL — ABNORMAL LOW (ref 150–400)
RBC: 4.65 MIL/uL (ref 3.87–5.11)
RDW: 14.6 % (ref 11.5–15.5)
WBC: 8.5 10*3/uL (ref 4.0–10.5)
nRBC: 0 % (ref 0.0–0.2)

## 2019-11-13 LAB — BASIC METABOLIC PANEL
Anion gap: 12 (ref 5–15)
Anion gap: 13 (ref 5–15)
BUN: 30 mg/dL — ABNORMAL HIGH (ref 6–20)
BUN: 32 mg/dL — ABNORMAL HIGH (ref 6–20)
CO2: 20 mmol/L — ABNORMAL LOW (ref 22–32)
CO2: 21 mmol/L — ABNORMAL LOW (ref 22–32)
Calcium: 7.2 mg/dL — ABNORMAL LOW (ref 8.9–10.3)
Calcium: 7.2 mg/dL — ABNORMAL LOW (ref 8.9–10.3)
Chloride: 109 mmol/L (ref 98–111)
Chloride: 107 mmol/L (ref 98–111)
Creatinine, Ser: 2.5 mg/dL — ABNORMAL HIGH (ref 0.44–1.00)
Creatinine, Ser: 2.5 mg/dL — ABNORMAL HIGH (ref 0.44–1.00)
GFR calc Af Amer: 24 mL/min — ABNORMAL LOW (ref >60)
GFR calc Af Amer: 24 mL/min — ABNORMAL LOW (ref >60)
GFR calc non Af Amer: 21 mL/min — ABNORMAL LOW (ref >60)
GFR calc non Af Amer: 21 mL/min — ABNORMAL LOW (ref >60)
Glucose, Bld: 129 mg/dL — ABNORMAL HIGH (ref 70–99)
Glucose, Bld: 94 mg/dL (ref 70–99)
Potassium: 4.5 mmol/L (ref 3.5–5.1)
Potassium: 4.4 mmol/L (ref 3.5–5.1)
Sodium: 141 mmol/L (ref 135–145)
Sodium: 141 mmol/L (ref 135–145)

## 2019-11-13 LAB — PROTIME-INR
INR: 2.1 — ABNORMAL HIGH (ref 0.8–1.2)
Prothrombin Time: 23.7 seconds — ABNORMAL HIGH (ref 11.4–15.2)

## 2019-11-13 LAB — BRAIN NATRIURETIC PEPTIDE: B Natriuretic Peptide: 798.4 pg/mL — ABNORMAL HIGH (ref 0.0–100.0)

## 2019-11-13 LAB — CALCIUM, IONIZED: Calcium, Ionized, Serum: 4.5 mg/dL (ref 4.5–5.6)

## 2019-11-13 LAB — GLUCOSE, CAPILLARY
Glucose-Capillary: 79 mg/dL (ref 70–99)
Glucose-Capillary: 72 mg/dL (ref 70–99)
Glucose-Capillary: 73 mg/dL (ref 70–99)
Glucose-Capillary: 103 mg/dL — ABNORMAL HIGH (ref 70–99)
Glucose-Capillary: 151 mg/dL — ABNORMAL HIGH (ref 70–99)

## 2019-11-13 LAB — AMMONIA: Ammonia: 38 umol/L — ABNORMAL HIGH (ref 9–35)

## 2019-11-13 LAB — MAGNESIUM: Magnesium: 1.8 mg/dL (ref 1.7–2.4)

## 2019-11-13 LAB — CK: Total CK: 524 U/L — ABNORMAL HIGH (ref 38–234)

## 2019-11-13 MED ORDER — MAGNESIUM SULFATE 2 GM/50ML IV SOLN
2.0000 g | Freq: Once | INTRAVENOUS | Status: AC
  Administered 2019-11-13: 2 g via INTRAVENOUS
  Filled 2019-11-13: qty 50

## 2019-11-13 MED ORDER — VANCOMYCIN HCL IN DEXTROSE 1-5 GM/200ML-% IV SOLN
1000.0000 mg | INTRAVENOUS | Status: DC
  Administered 2019-11-13 – 2019-11-15 (×2): 1000 mg via INTRAVENOUS
  Filled 2019-11-13 (×3): qty 200

## 2019-11-13 MED ORDER — SODIUM CHLORIDE 0.9 % IV SOLN
3.0000 g | Freq: Two times a day (BID) | INTRAVENOUS | Status: DC
  Administered 2019-11-13 – 2019-11-16 (×6): 3 g via INTRAVENOUS
  Filled 2019-11-13 (×6): qty 8

## 2019-11-13 MED ORDER — PANTOPRAZOLE SODIUM 40 MG PO PACK
40.0000 mg | PACK | Freq: Every day | ORAL | Status: DC
  Administered 2019-11-13 – 2020-01-06 (×54): 40 mg
  Filled 2019-11-13 (×57): qty 20

## 2019-11-13 NOTE — Progress Notes (Signed)
Pharmacy Antibiotic Note  Susan [name] is a 59 y.o. female admitted on 11/11/2019 with AMS and respiratory failure. Pharmacy has been consulted for vancomycin and Zosyn dosing for sepsis/PNA.    ID has evaluated and wants to transition Zosyn to Unasyn. SCr stable ~2.5 mg/dl, blood culture has speciated to Strep pneumoniae.  Plan: -Stop Zosyn -Unasyn 3g IV q12h -Watch renal function closely, if improves increase to q6h   Height: 5\' 3"  (160 cm) Weight: 228 lb 9.9 oz (103.7 kg) IBW/kg (Calculated) : 52.4  Temp (24hrs), Avg:100.5 F (38.1 C), Min:99.1 F (37.3 C), Max:100.9 F (38.3 C)  Recent Labs  Lab 11/11/19 1453 11/11/19 1501 11/11/19 1738 11/11/19 1738 11/11/19 2029 11/11/19 2323 11/11/19 2332 11/12/19 0441 11/12/19 0830 11/12/19 1644 11/13/19 0044 11/13/19 0402 11/13/19 0820  WBC 4.7  --   --   --   --   --   --  4.3  --   --   --  8.5  --   CREATININE 2.46*   < > 2.05*   < >  --  2.08*  --   --  2.28* 2.51* 2.50*  --  2.50*  LATICACIDVEN >11.0*  --  5.4*  --  3.9*  --  2.8*  --   --   --   --   --   --    < > = values in this interval not displayed.    Estimated Creatinine Clearance: 28.6 mL/min (A) (by C-G formula based on SCr of 2.5 mg/dL (H)).    Allergies  Allergen Reactions  . Lyrica [Pregabalin] Swelling    Has LE swelling   Vanc 3/10 >> Unasyn 3/10 >> 3/11; 3/12 >> Zosyn x1 3/10, 3/11 >>3/12  3/10 covid / flu - negative 3/10 MRSA PCR - positive 3/10BCx - 1/4 Strep pneumo 3/10UCx - negative 3/10 TA - reincubated   Arrie Senate, PharmD, BCPS Clinical Pharmacist [phone] Please check AMION for all [place] numbers 11/13/2019
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
## 2019-11-13 NOTE — Progress Notes (Signed)
Pharmacy Antibiotic Note  Susan [name] is a 59 y.o. female admitted on 11/11/2019 with AMS and respiratory failure. Pharmacy has been consulted for vancomycin and Zosyn dosing for sepsis/PNA.    SCr hovers around 2.5 despite IVF - since it didn't worsen, will schedule vancomycin.  Tmax 100.6, WBC WNL, LA down 2.8.  Plan: Vanc 1000mg  IV Q48H for AUC 453 using SCr 2.5 Zosyn EID 3.375gm IV Q8H Monitor renal fxn, clinical progress, vanc AUC as indicated Dose vanc per level if SCr worsens  Height: 5\' 3"  (160 cm) Weight: 228 lb 9.9 oz (103.7 kg) IBW/kg (Calculated) : 52.4  Temp (24hrs), Avg:100.8 F (38.2 C), Min:99.5 F (37.5 C), Max:101.5 F (38.6 C)  Recent Labs  Lab 11/11/19 1453 11/11/19 1501 11/11/19 1738 11/11/19 2029 11/11/19 2323 11/11/19 2332 11/12/19 0441 11/12/19 0830 11/12/19 1644 11/13/19 0044 11/13/19 0402  WBC 4.7  --   --   --   --   --  4.3  --   --   --  8.5  CREATININE 2.46*   < > 2.05*  --  2.08*  --   --  2.28* 2.51* 2.50*  --   LATICACIDVEN >11.0*  --  5.4* 3.9*  --  2.8*  --   --   --   --   --    < > = values in this interval not displayed.    Estimated Creatinine Clearance: 28.6 mL/min (A) (by C-G formula based on SCr of 2.5 mg/dL (H)).    Allergies  Allergen Reactions  . Lyrica [Pregabalin] Swelling    Has LE swelling   Vanc 3/10 >> Unasyn 3/10 >> 3/11 Zosyn x1 3/10, 3/11 >>  3/10 covid / flu - negative 3/10 MRSA PCR - positive 3/10BCx - 1/4 GPC, likely a contaminant  3/10UCx - negative 3/10 TA - reincubated  Hiram Mciver D. Mina Marble, PharmD, BCPS, [place] 11/13/2019, 7:10 AM

## 2019-11-13 NOTE — Progress Notes (Addendum)
NAME:  Susan [name], MRN:  [id number], DOB:  Apr 06, 1961, LOS: 2 ADMISSION DATE:  11/11/2019, CONSULTATION DATE:  3/10 REFERRING MD:  Sabra Heck, CHIEF COMPLAINT:  Acute encephalopathy and respiratory failure    Brief History   59 year old female with history of schizophrenia, HIV, diabetes with diabetic neuropathy and falls at home.  EMS called to the patient's house after what was called out as a fall.  Upon arrival the patient was unresponsive with agonal respiratory pattern.  She was intubated by EMS, hypotensive on arrival to ER.  Admitted with a working diagnosis of acute toxic/metabolic encephalopathy felt possibly secondary to overdose complicated by aspiration pneumonia and septic shock  History of present illness   59 year old female, was found at bedside after what was felt to be a fall.  She was  unresponsive with agonal respiratory effort.  She was intubated by EMS, did not respond to IV Narcan, transported to [facility] emergency room. Her blood glucose on arrival to ER  , Lactic acid: 312 greater than 11, ethanol level less than 10, salicylate level less than 7, creatinine 2.1 calcium 0.98 portable chest x-ray showing endotracheal tube in satisfactory position with bilateral patchy pulmonary infiltrates right greater than left.  She remained encephalopathic, following arrival to the emergency room.  She would reach up with the left arm for the endotracheal tube, she was significantly hypotensive, this became even worse when propofol infusion was initiated.  Critical care asked to admit.  Initial QTC 427 ms  Past Medical History  Frequent falls at home,Tobacco abuse, diabetes with painful polyneuropathy, bipolar disease, GERD, HIV, schizophrenia, history of IV drug abuse, seizure disorder.   Significant Hospital Events   3/10: EMS summoned to house.  Intubated on arrival, hypotensive on arrival to emergency room  Consults:    Procedures:  Oral endotracheal tube 3/10 Right femoral  triple-lumen catheter 3/10 (ER) right internal jugular vein triple-lumen catheter 3/10  Significant Diagnostic Tests:  CT brain 3/10>>> Normal head CT EEG 3/10>>>Moderate to severe diffuse encephalopathy. No seizures Urine drug screen 3/10>>> 3/10 acetaminophen less than 10, salicylate level less than 7 Ethanol level less than 10 11/12/2019 Echo>> EF 20-25%, LV function severely diminished, Global Hypokinesis,LV hypertrophy, Grade 1 Diastolic Dysfunction, Elevated PASP, Dilated LA, MV regurgitation, small mobile density right coronary cusp  Micro Data:  Respiratory culture 3/10>> GS >> Mod G+ cocci, Few G- rods>> Blood culture 3/10 CD4 3/10 Urine Culture 3/10>> No growth  Antimicrobials:  Vancomycin 3/10 unasyn 3/10-3/11 Zosyn 3/11  Interim history/subjective:  Febrile with Tmax 100.8  in the last 24 hours. Non-purposeful movements Levo at 20  Vasopressin at 0.04   Objective   Blood pressure 112/78, pulse (!) 112, temperature (!) 100.8 F (38.2 C), resp. rate (!) 28, height 5\' 3"  (1.6 m), weight 103.7 kg, SpO2 95 %.    Vent Mode: PCV FiO2 (%):  [60 %-100 %] 60 % Set Rate:  [32 bmp] 32 bmp PEEP:  [10 cmH20] 10 cmH20 Plateau Pressure:  [22 cmH20-28 cmH20] 24 cmH20   Intake/Output Summary (Last 24 hours) at 11/13/2019 0940 Last data filed at 11/13/2019 0800 Gross per 24 hour  Intake 5536.13 ml  Output 1730 ml  Net 3806.13 ml   Filed Weights   11/11/19 1454 11/13/19 0404  Weight: 114 kg 103.7 kg    Examination: General: 59 year old black female currently sedated and  mechanical ventilated, not following commands, MAE x 4, sometimes with purpose, In NAD HENT: NCAT, Pupils equal reactive, mucous membranes pink  and  dry orally intubated Lungs: Bilateral Chest excursion, Coarse throughout with rales per bases, no accessory use currently Cardiovascular: S1, S2, RRR with unifocal PVC's ,IRR reg ,  mildly tachycardic with heart rate in the 112 range Abdomen: Soft not tender,  ND, BS + but  Hypoactive, Body mass index is 40.5 kg/m. Extremities: No obvious deformities,Cool to touch, pulses thready, no edema Neuro: Opening eyes to noxious  stimulus, reaching with upper extremities, sometimes with purpose GU: Foley cath  [facility] Problem list     Assessment & Plan:   Acute toxic and metabolic encephalopathy.  She has a history of schizophrenia & seizure disorder. , Looking at her home medications Abilify, Cogentin, doxepin, Neurontin and BuSpar all on her medicine profile. Wonder about either breakthrough seizure or unintentional versus intentional overdose -EEG - no seizure activity -Serum ethanol, tylenol and salicylate unremarkable -UDS + cannabinol Plan Continue home AEDs Neuro checks per unit protocol Supportive care Minimize sedation  ARDS/acute hypoxic and hypercarbic respiratory failure in the setting of what is likely aspiration pneumonia Current every day smoker Plan Full ventilator support Broaden broad spectrum antibiotics: Vanc, Zosyn VAP bundle PAD protocol, RASS goal -1 to -2 CXR PRN Sat Goals > 92% Continue Pulmicort and DuoNebs  History of asthma Plan Continue duoneb and Pulmicort Trend and replete Mag  Septic shock. ? Cardiogenic shock    Suspect secondary to aspiration pneumonia.  Echo with  ? Vegetation>> small mobile density per right coronary cusp 0.82 cm by 0.31 cm. EF 20-25% Plan Wean levophed and vasopressin for MAP goal >65 Trend LA Continue broad spectrum antibiotics Follow-up culture data Will order TEE to better visualize vegetation Will consult ID for ABX narrowing Trend fever curve  and WBC Re-culture as is clinically indicated  EF 0000000 Grade 1 Diastolic Dysfunction Plan TEE Trend BNP Consider Cards Consult   Rhabd Trend CK IVF as above  Hyperglycemia w/ known DM and known polyeuropathy  Plan Evaluating for DKA as above Hyperglycemia protocol  CBG's and SSI  Acute renal failure:  Suspect 2/2 shock Plan Cont vasoactive gtts MAP > 65 Strict I&O Renal dose meds Trend BMET Replete electrolytes as needed  Mildly elevated INR -  Normal LFT's ?hepatic congestion vs fever/ hyper-metabolism/ clearing of clotting factors more rapidly Plan: Will trend INR Monitor for bleeding   Best practice:  Diet: NPO Pain/Anxiety/Delirium protocol (if indicated): 3/10 VAP protocol (if indicated): 3/10 DVT prophylaxis: scd and then add heparin [place] if CT head nebg GI prophylaxis: PPI Glucose control: ssi Mobility: BR Code Status: full code  Family Communication: Sister updated by phone 3/12 Disposition: to ICU   Labs   CBC: Recent Labs  Lab 11/11/19 1453 11/11/19 1501 11/12/19 0017 11/12/19 0441 11/12/19 0526 11/12/19 1504 11/13/19 0402  WBC 4.7  --   --  4.3  --   --  8.5  NEUTROABS 1.4*  --   --   --   --   --   --   HGB 16.4*   < > 16.0* 15.0 15.3* 13.9 13.0  HCT 53.2*   < > 47.0* 46.8* 45.0 41.0 39.3  MCV 91.9  --   --  86.2  --   --  84.5  PLT 262  --   --  177  --   --  138*   < > = values in this interval not displayed.    Basic Metabolic Panel: Recent Labs  Lab 11/11/19 1453 11/11/19 1501 11/11/19 1738 11/11/19 2125  11/11/19 2323 11/12/19 0017 11/12/19 0441 11/12/19 0526 11/12/19 0830 11/12/19 0833 11/12/19 1504 11/12/19 1644 11/13/19 0044  NA 140   < > 140   < > 141   < >  --  143 141  --  142 143 141  K 3.7   < > 3.1*   < > 3.5   < >  --  3.9 4.0  --  4.0 4.1 4.5  CL 101   < > 105  --  112*  --   --   --  109  --   --  109 109  CO2 17*   < > 20*  --  19*  --   --   --  19*  --   --  20* 20*  GLUCOSE 324*   < > 270*  --  104*  --   --   --  104*  --   --  134* 129*  BUN 15   < > 14  --  19  --   --   --  23*  --   --  26* 30*  CREATININE 2.46*   < > 2.05*  --  2.08*  --   --   --  2.28*  --   --  2.51* 2.50*  CALCIUM 8.4*   < > 8.1*  --  7.9*  --   --   --  7.6*  --   --  7.3* 7.2*  MG 1.9  --   --   --   --   --  1.9  --   --  1.9  --    --   --   PHOS 4.6  --   --   --   --   --   --   --   --   --   --   --   --    < > = values in this interval not displayed.   GFR: Estimated Creatinine Clearance: 28.6 mL/min (A) (by C-G formula based on SCr of 2.5 mg/dL (H)). Recent Labs  Lab 11/11/19 1453 11/11/19 1738 11/11/19 2029 11/11/19 2332 11/12/19 0441 11/13/19 0402  WBC 4.7  --   --   --  4.3 8.5  LATICACIDVEN >11.0* 5.4* 3.9* 2.8*  --   --     Liver Function Tests: Recent Labs  Lab 11/11/19 1453  AST 43*  ALT 18  ALKPHOS 89  BILITOT 0.4  PROT 5.5*  ALBUMIN 2.4*   No results for input(s): LIPASE, AMYLASE in the last 168 hours. No results for input(s): AMMONIA in the last 168 hours.  ABG    Component Value Date/Time   PHART 7.274 (L) 11/12/2019 1504   PCO2ART 50.5 (H) 11/12/2019 1504   PO2ART 150.0 (H) 11/12/2019 1504   HCO3 23.1 11/12/2019 1504   TCO2 25 11/12/2019 1504   ACIDBASEDEF 4.0 (H) 11/12/2019 1504   O2SAT 99.0 11/12/2019 1504     Coagulation Profile: Recent Labs  Lab 11/11/19 1453 11/13/19 0402  INR 1.3* 2.1*    Cardiac Enzymes: Recent Labs  Lab 11/11/19 1453 11/12/19 0441 11/12/19 1644  CKTOTAL 868* 1,037* 726*    HbA1C: Hgb A1c MFr Bld  Date/Time Value Ref Range Status  11/11/2019 05:38 PM 12.3 (H) 4.8 - 5.6 % Final    Comment:    (NOTE) Pre diabetes:          5.7%-6.4% Diabetes:              >  6.4% Glycemic control for   <7.0% adults with diabetes   02/17/2019 03:09 AM 9.5 (H) 4.8 - 5.6 % Final    Comment:    (NOTE) Pre diabetes:          5.7%-6.4% Diabetes:              >6.4% Glycemic control for   <7.0% adults with diabetes     CBG: Recent Labs  Lab 11/12/19 1528 11/12/19 1938 11/12/19 2350 11/13/19 0337 11/13/19 0801  GLUCAP 72 109* 109* 79 72    Critical care time: 45 minutes     Magdalen Spatz, MSN, AGACNP-BC [place] Pager # [phone] After 4 pm please call [phone] 11/13/2019 9:40 AM

## 2019-11-14 ENCOUNTER — Other Ambulatory Visit: Payer: Self-pay

## 2019-11-14 ENCOUNTER — Inpatient Hospital Stay (HOSPITAL_COMMUNITY): Payer: Medicare Other

## 2019-11-14 DIAGNOSIS — I330 Acute and subacute infective endocarditis: Secondary | ICD-10-CM | POA: Diagnosis not present

## 2019-11-14 DIAGNOSIS — N179 Acute kidney failure, unspecified: Secondary | ICD-10-CM | POA: Diagnosis not present

## 2019-11-14 DIAGNOSIS — Z888 Allergy status to other drugs, medicaments and biological substances status: Secondary | ICD-10-CM

## 2019-11-14 DIAGNOSIS — B953 Streptococcus pneumoniae as the cause of diseases classified elsewhere: Secondary | ICD-10-CM

## 2019-11-14 DIAGNOSIS — B20 Human immunodeficiency virus [HIV] disease: Secondary | ICD-10-CM | POA: Diagnosis not present

## 2019-11-14 DIAGNOSIS — J189 Pneumonia, unspecified organism: Secondary | ICD-10-CM | POA: Diagnosis present

## 2019-11-14 DIAGNOSIS — Z21 Asymptomatic human immunodeficiency virus [HIV] infection status: Secondary | ICD-10-CM

## 2019-11-14 DIAGNOSIS — J13 Pneumonia due to Streptococcus pneumoniae: Secondary | ICD-10-CM | POA: Diagnosis not present

## 2019-11-14 DIAGNOSIS — I358 Other nonrheumatic aortic valve disorders: Secondary | ICD-10-CM | POA: Diagnosis present

## 2019-11-14 DIAGNOSIS — R7881 Bacteremia: Secondary | ICD-10-CM | POA: Diagnosis not present

## 2019-11-14 DIAGNOSIS — B955 Unspecified streptococcus as the cause of diseases classified elsewhere: Secondary | ICD-10-CM

## 2019-11-14 DIAGNOSIS — J9601 Acute respiratory failure with hypoxia: Secondary | ICD-10-CM | POA: Diagnosis not present

## 2019-11-14 LAB — GLUCOSE, CAPILLARY
Glucose-Capillary: 130 mg/dL — ABNORMAL HIGH (ref 70–99)
Glucose-Capillary: 132 mg/dL — ABNORMAL HIGH (ref 70–99)
Glucose-Capillary: 136 mg/dL — ABNORMAL HIGH (ref 70–99)
Glucose-Capillary: 165 mg/dL — ABNORMAL HIGH (ref 70–99)
Glucose-Capillary: 170 mg/dL — ABNORMAL HIGH (ref 70–99)
Glucose-Capillary: 172 mg/dL — ABNORMAL HIGH (ref 70–99)
Glucose-Capillary: 178 mg/dL — ABNORMAL HIGH (ref 70–99)

## 2019-11-14 LAB — COMPREHENSIVE METABOLIC PANEL
ALT: 18 U/L (ref 0–44)
AST: 42 U/L — ABNORMAL HIGH (ref 15–41)
Albumin: 1.6 g/dL — ABNORMAL LOW (ref 3.5–5.0)
Alkaline Phosphatase: 103 U/L (ref 38–126)
Anion gap: 12 (ref 5–15)
BUN: 33 mg/dL — ABNORMAL HIGH (ref 6–20)
CO2: 24 mmol/L (ref 22–32)
Calcium: 7.2 mg/dL — ABNORMAL LOW (ref 8.9–10.3)
Chloride: 105 mmol/L (ref 98–111)
Creatinine, Ser: 2.47 mg/dL — ABNORMAL HIGH (ref 0.44–1.00)
GFR calc Af Amer: 24 mL/min — ABNORMAL LOW (ref >60)
GFR calc non Af Amer: 21 mL/min — ABNORMAL LOW (ref >60)
Glucose, Bld: 153 mg/dL — ABNORMAL HIGH (ref 70–99)
Potassium: 3.3 mmol/L — ABNORMAL LOW (ref 3.5–5.1)
Sodium: 141 mmol/L (ref 135–145)
Total Bilirubin: 0.6 mg/dL (ref 0.3–1.2)
Total Protein: 4.9 g/dL — ABNORMAL LOW (ref 6.5–8.1)

## 2019-11-14 LAB — CULTURE, BLOOD (ROUTINE X 2): Special Requests: ADEQUATE

## 2019-11-14 LAB — PROTIME-INR
INR: 1.4 — ABNORMAL HIGH (ref 0.8–1.2)
Prothrombin Time: 16.8 seconds — ABNORMAL HIGH (ref 11.4–15.2)

## 2019-11-14 LAB — CBC
HCT: 34.4 % — ABNORMAL LOW (ref 36.0–46.0)
Hemoglobin: 11 g/dL — ABNORMAL LOW (ref 12.0–15.0)
MCH: 27.7 pg (ref 26.0–34.0)
MCHC: 32 g/dL (ref 30.0–36.0)
MCV: 86.6 fL (ref 80.0–100.0)
Platelets: 86 10*3/uL — ABNORMAL LOW (ref 150–400)
RBC: 3.97 MIL/uL (ref 3.87–5.11)
RDW: 14.9 % (ref 11.5–15.5)
WBC: 5.4 10*3/uL (ref 4.0–10.5)
nRBC: 0 % (ref 0.0–0.2)

## 2019-11-14 LAB — MAGNESIUM: Magnesium: 2.3 mg/dL (ref 1.7–2.4)

## 2019-11-14 LAB — LACTIC ACID, PLASMA: Lactic Acid, Venous: 1.2 mmol/L (ref 0.5–1.9)

## 2019-11-14 IMAGING — DX DG CHEST 1V PORT
1 series · 1 of 1 positions shown · non-contrast
Comparison: [DATE]

CLINICAL DATA: Acute respiratory failure

EXAM:
PORTABLE CHEST 1 VIEW

[chest ap]
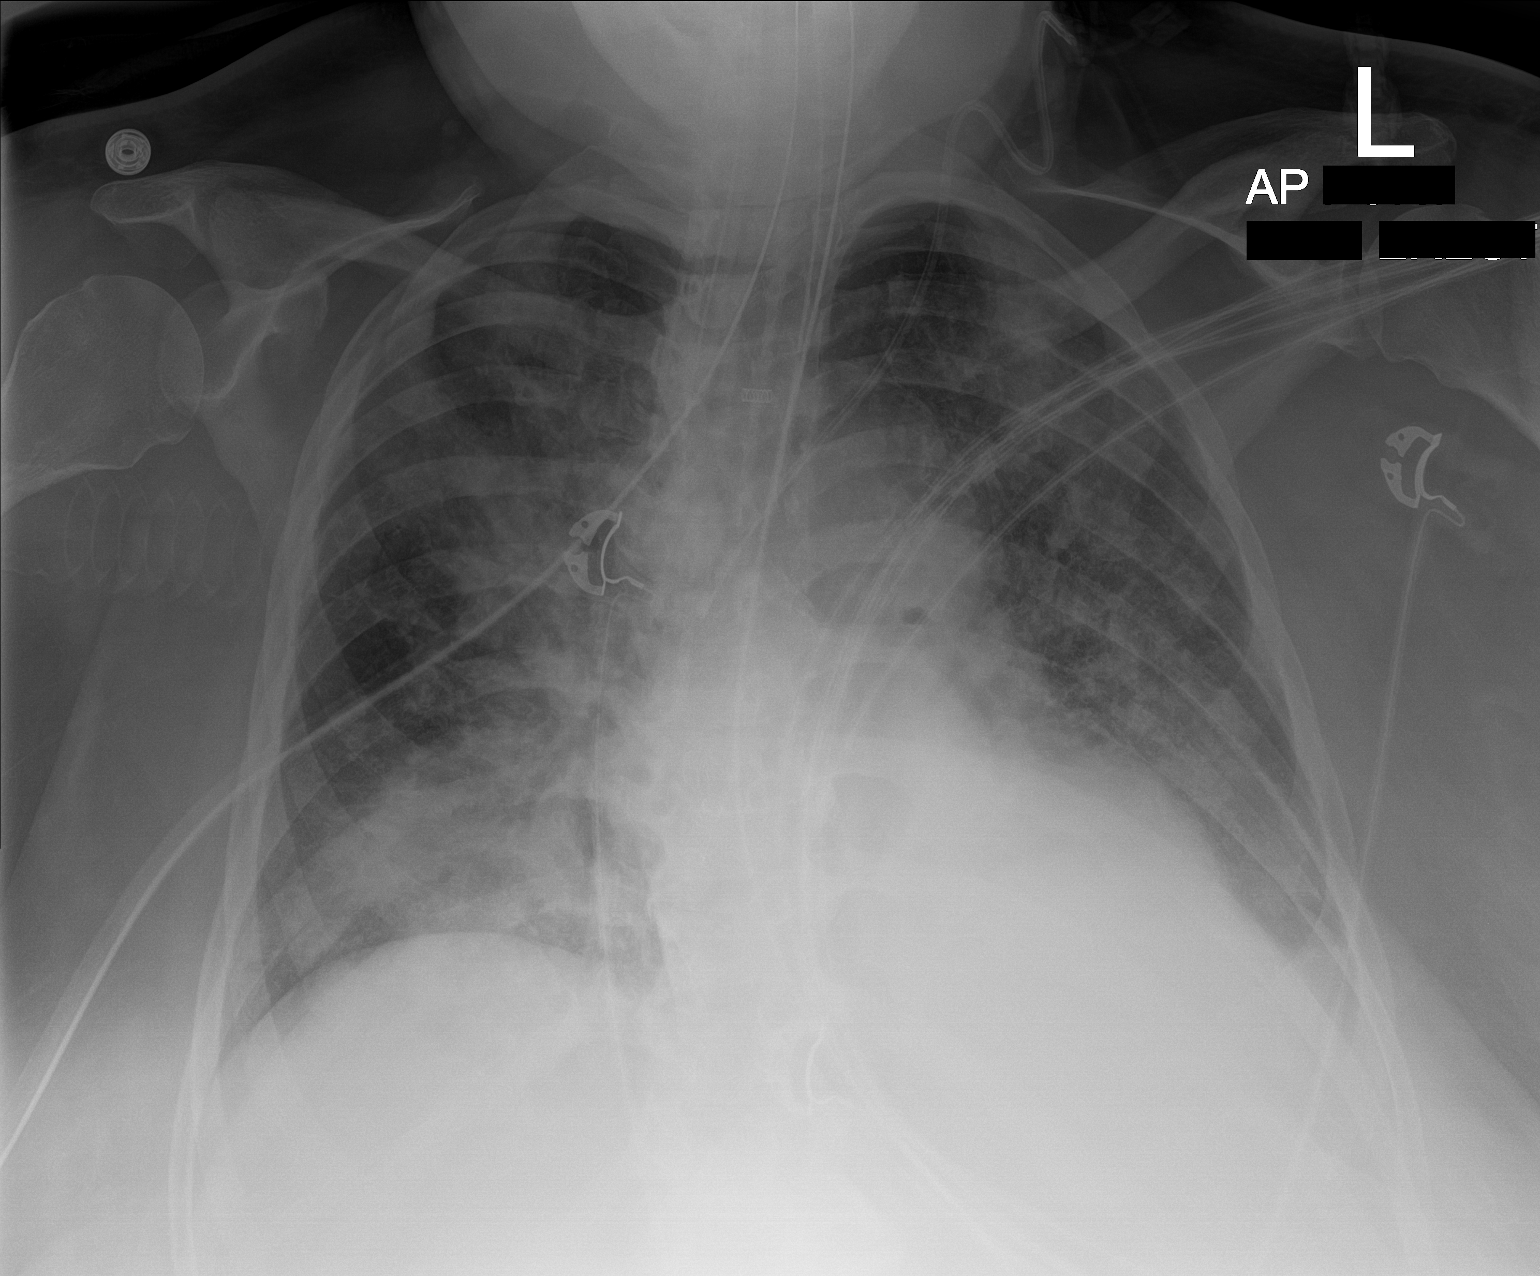

[1 of 1 positions shown; findings below may reference images not displayed]

FINDINGS: Endotracheal tube terminates 5 cm above the carina.

Left IJ venous catheter terminates in the lower SVC. Enteric tube
courses into the stomach.

Multifocal patchy opacities, compatible with multifocal pneumonia.
Possible small left pleural effusion. No pneumothorax.

The heart is normal in size.
IMPRESSION: Endotracheal tube terminates 5 cm above the carina. Additional
support apparatus as above.

Stable multifocal pneumonia.  Possible small left pleural effusion.

## 2019-11-14 MED ORDER — POTASSIUM CHLORIDE 20 MEQ/15ML (10%) PO SOLN: 40.0000 meq | Freq: Once | ORAL | Status: DC

## 2019-11-14 MED ORDER — DEXMEDETOMIDINE HCL IN NACL 400 MCG/100ML IV SOLN
0.4000 ug/kg/h | INTRAVENOUS | Status: DC
  Administered 2019-11-14: 0.4 ug/kg/h via INTRAVENOUS
  Administered 2019-11-14: 0.3 ug/kg/h via INTRAVENOUS
  Administered 2019-11-15: 0.5 ug/kg/h via INTRAVENOUS
  Administered 2019-11-15: 0.7 ug/kg/h via INTRAVENOUS
  Administered 2019-11-16: 0.4 ug/kg/h via INTRAVENOUS
  Administered 2019-11-17 (×5): 1 ug/kg/h via INTRAVENOUS
  Administered 2019-11-17: 0.4 ug/kg/h via INTRAVENOUS
  Administered 2019-11-18: 07:00:00 0.8 ug/kg/h via INTRAVENOUS
  Administered 2019-11-18 (×4): 1 ug/kg/h via INTRAVENOUS
  Administered 2019-11-18: 03:00:00 0.8 ug/kg/h via INTRAVENOUS
  Administered 2019-11-19 – 2019-11-20 (×8): 1 ug/kg/h via INTRAVENOUS
  Administered 2019-11-20: 20:00:00 0.8 ug/kg/h via INTRAVENOUS
  Administered 2019-11-20 (×2): 1 ug/kg/h via INTRAVENOUS
  Administered 2019-11-21: 0.7 ug/kg/h via INTRAVENOUS
  Administered 2019-11-21: 0.9 ug/kg/h via INTRAVENOUS
  Administered 2019-11-21: 0.7 ug/kg/h via INTRAVENOUS
  Administered 2019-11-21: 1 ug/kg/h via INTRAVENOUS
  Administered 2019-11-21: 0.9 ug/kg/h via INTRAVENOUS
  Administered 2019-11-21: 0.7 ug/kg/h via INTRAVENOUS
  Administered 2019-11-22: 21:00:00 0.8 ug/kg/h via INTRAVENOUS
  Administered 2019-11-22: 1 ug/kg/h via INTRAVENOUS
  Administered 2019-11-22 (×2): 1.2 ug/kg/h via INTRAVENOUS
  Administered 2019-11-22: 13:00:00 0.7 ug/kg/h via INTRAVENOUS
  Administered 2019-11-23: 0.4 ug/kg/h via INTRAVENOUS
  Administered 2019-11-23: 1.2 ug/kg/h via INTRAVENOUS
  Administered 2019-11-23: 0.8 ug/kg/h via INTRAVENOUS
  Administered 2019-11-23: 1.1 ug/kg/h via INTRAVENOUS
  Administered 2019-11-23: 06:00:00 0.8 ug/kg/h via INTRAVENOUS
  Administered 2019-11-23 – 2019-11-24 (×2): 1.2 ug/kg/h via INTRAVENOUS
  Administered 2019-11-24: 1.1 ug/kg/h via INTRAVENOUS
  Administered 2019-11-24 (×5): 1.2 ug/kg/h via INTRAVENOUS
  Administered 2019-11-24: 1.1 ug/kg/h via INTRAVENOUS
  Administered 2019-11-25: 2 ug/kg/h via INTRAVENOUS
  Administered 2019-11-25: 1.2 ug/kg/h via INTRAVENOUS
  Administered 2019-11-25: 22:00:00 2.2 ug/kg/h via INTRAVENOUS
  Administered 2019-11-25 (×3): 1.2 ug/kg/h via INTRAVENOUS
  Administered 2019-11-25: 1.8 ug/kg/h via INTRAVENOUS
  Administered 2019-11-25: 1.5 ug/kg/h via INTRAVENOUS
  Administered 2019-11-26: 2 ug/kg/h via INTRAVENOUS
  Administered 2019-11-26 (×3): 2.4 ug/kg/h via INTRAVENOUS
  Administered 2019-11-26: 2 ug/kg/h via INTRAVENOUS
  Administered 2019-11-26: 23:00:00 2.1 ug/kg/h via INTRAVENOUS
  Administered 2019-11-26: 10:00:00 2 ug/kg/h via INTRAVENOUS
  Administered 2019-11-26 (×2): 2.4 ug/kg/h via INTRAVENOUS
  Administered 2019-11-26 (×4): 2 ug/kg/h via INTRAVENOUS
  Administered 2019-11-27: 2.2 ug/kg/h via INTRAVENOUS
  Administered 2019-11-27 (×2): 1.5 ug/kg/h via INTRAVENOUS
  Administered 2019-11-27 (×4): 2.2 ug/kg/h via INTRAVENOUS
  Administered 2019-11-27 – 2019-11-28 (×5): 1.5 ug/kg/h via INTRAVENOUS
  Administered 2019-11-28 (×3): 1.7 ug/kg/h via INTRAVENOUS
  Administered 2019-11-28: 1 ug/kg/h via INTRAVENOUS
  Administered 2019-11-28 (×4): 1.7 ug/kg/h via INTRAVENOUS
  Administered 2019-11-28: 1.5 ug/kg/h via INTRAVENOUS
  Administered 2019-11-29 (×7): 1.7 ug/kg/h via INTRAVENOUS
  Administered 2019-11-30: 0.6 ug/kg/h via INTRAVENOUS
  Administered 2019-11-30: 1 ug/kg/h via INTRAVENOUS
  Administered 2019-11-30 (×3): 1.7 ug/kg/h via INTRAVENOUS
  Administered 2019-11-30 (×2): 1 ug/kg/h via INTRAVENOUS
  Administered 2019-12-01: 0.6 ug/kg/h via INTRAVENOUS
  Filled 2019-11-14 (×4): qty 100
  Filled 2019-11-14: qty 200
  Filled 2019-11-14 (×6): qty 100
  Filled 2019-11-14: qty 200
  Filled 2019-11-14 (×4): qty 100
  Filled 2019-11-14: qty 200
  Filled 2019-11-14 (×5): qty 100
  Filled 2019-11-14: qty 200
  Filled 2019-11-14 (×10): qty 100
  Filled 2019-11-14: qty 200
  Filled 2019-11-14 (×10): qty 100
  Filled 2019-11-14 (×2): qty 200
  Filled 2019-11-14 (×13): qty 100
  Filled 2019-11-14: qty 200
  Filled 2019-11-14 (×4): qty 100
  Filled 2019-11-14: qty 200
  Filled 2019-11-14 (×4): qty 100
  Filled 2019-11-14: qty 200
  Filled 2019-11-14 (×4): qty 100
  Filled 2019-11-14: qty 200
  Filled 2019-11-14 (×4): qty 100
  Filled 2019-11-14: qty 200
  Filled 2019-11-14 (×5): qty 100
  Filled 2019-11-14: qty 200
  Filled 2019-11-14 (×12): qty 100
  Filled 2019-11-14: qty 200
  Filled 2019-11-14 (×2): qty 100
  Filled 2019-11-14: qty 200
  Filled 2019-11-14 (×2): qty 100

## 2019-11-14 MED ORDER — POTASSIUM CHLORIDE 20 MEQ/15ML (10%) PO SOLN
40.0000 meq | Freq: Once | ORAL | Status: AC
  Administered 2019-11-14: 40 meq
  Filled 2019-11-14: qty 30

## 2019-11-14 NOTE — Progress Notes (Signed)
Patient ID: Susan [name], female   DOB: November 02, 1960, 59 y.o.   MRN: [id number]         [facility] for Infectious Disease  Date of Admission:  11/11/2019           Day 4 vancomycin        Day 4 ampicillin sulbactam ASSESSMENT: She has pneumonia with a acute respiratory failure.  1 of 2 admission blood cultures has grown pneumococcus.  Sputum is growing staph aureus.  She has a small vegetation on her aortic valve.  I will continue her current antibiotic therapy pending antibiotic susceptibility results of her staph aureus isolate.  She has HIV infection that is under excellent, long-term control.  PLAN: 1. Continue vancomycin and ampicillin sulbactam for now  Principal Problem:   Acute respiratory failure with hypoxemia (HCC) Active Problems:   Pneumococcal bacteremia   Pneumonia   Aortic valve endocarditis   HIV disease (HCC)   AKI (acute kidney injury) ([place])   Scheduled Meds: . budesonide (PULMICORT) nebulizer solution  0.5 mg Nebulization BID  . chlorhexidine gluconate (MEDLINE KIT)  15 mL Mouth Rinse BID  . Chlorhexidine Gluconate Cloth  6 each Topical q morning - 10a  . dolutegravir  50 mg Oral Daily  . emtricitabine-tenofovir AF  1 tablet Per Tube Daily  . feeding supplement (VITAL HIGH PROTEIN)  1,000 mL Per Tube Q24H  . heparin injection (subcutaneous)  5,000 Units Subcutaneous Q8H  . ipratropium-albuterol  3 mL Nebulization QID  . mouth rinse  15 mL Mouth Rinse 10 times per day  . mupirocin ointment  1 application Nasal BID  . pantoprazole sodium  40 mg Per Tube Daily   Continuous Infusions: . sodium chloride 10 mL/hr at 11/14/19 1300  . ampicillin-sulbactam (UNASYN) IV Stopped (11/14/19 0453)  . dexmedetomidine (PRECEDEX) IV infusion 0.4 mcg/kg/hr (11/14/19 1300)  . vancomycin Stopped (11/13/19 1746)   PRN Meds:.sodium chloride   Review of Systems: Review of Systems  Unable to perform ROS: Intubated    Allergies  Allergen Reactions  . Lyrica  [Pregabalin] Swelling    Has LE swelling    OBJECTIVE: Vitals:   11/14/19 1100 11/14/19 1154 11/14/19 1200 11/14/19 1300  BP: 110/64  109/60 (!) 98/52  Pulse: 99 (!) 105 (!) 102 100  Resp: (!) 22 (!) 25 (!) 27 (!) 24  Temp: (!) 94.8 F (34.9 C)  (!) 97.3 F (36.3 C) 98.1 F (36.7 C)  TempSrc:   Bladder   SpO2: 100% 97% 97% 94%  Weight:      Height:       Body mass index is 42.72 kg/m.  Physical Exam Constitutional:      Comments: She remains on the ventilator.  Cardiovascular:     Rate and Rhythm: Normal rate and regular rhythm.     Heart sounds: No murmur.  Pulmonary:     Comments: Coarse breath sounds throughout.    Lab Results Lab Results  Component Value Date   WBC 5.4 11/14/2019   HGB 11.0 (L) 11/14/2019   HCT 34.4 (L) 11/14/2019   MCV 86.6 11/14/2019   PLT 86 (L) 11/14/2019    Lab Results  Component Value Date   CREATININE 2.47 (H) 11/14/2019   BUN 33 (H) 11/14/2019   NA 141 11/14/2019   K 3.3 (L) 11/14/2019   CL 105 11/14/2019   CO2 24 11/14/2019    Lab Results  Component Value Date   ALT 18 11/14/2019   AST 42 (H)  11/14/2019   ALKPHOS 103 11/14/2019   BILITOT 0.6 11/14/2019    HIV 1 RNA Quant (copies/mL)  Date Value  06/29/2019 <20 NOT DETECTED  05/06/2018 <20 NOT DETECTED  11/13/2017 <20 NOT DETECTED   CD4 T Cell Abs (/uL)  Date Value  06/29/2019 860  05/06/2018 740  11/13/2017 630   Microbiology: Recent Results (from the past 240 hour(s))  Culture, blood (Routine x 2)     Status: Abnormal   Collection Time: 11/11/19  2:51 PM   Specimen: BLOOD  Result Value Ref Range Status   Specimen Description BLOOD CENTRAL LINE  Final   Special Requests   Final    BOTTLES DRAWN AEROBIC AND ANAEROBIC Blood Culture adequate volume   Culture  Setup Time   Final    GRAM POSITIVE COCCI IN CHAINS AEROBIC BOTTLE ONLY CRITICAL RESULT CALLED TO, READ BACK BY AND VERIFIED WITH: Salli Real [id number] Performed at [facility],  [place] [address], [place], [place] [address]    Culture STREPTOCOCCUS PNEUMONIAE (A)  Final   Report Status 11/14/2019 FINAL  Final   Organism ID, Bacteria STREPTOCOCCUS PNEUMONIAE  Final      Susceptibility   Streptococcus pneumoniae - MIC*    ERYTHROMYCIN <=0.12 SENSITIVE Sensitive     LEVOFLOXACIN 0.5 SENSITIVE Sensitive     VANCOMYCIN <=0.12 SENSITIVE Sensitive     PENICILLIN (meningitis) <=0.06 SENSITIVE Sensitive     PENO - penicillin <=0.06      PENICILLIN (non-meningitis) <=0.06 SENSITIVE Sensitive     PENICILLIN (oral) <=0.06 SENSITIVE Sensitive     CEFTRIAXONE (non-meningitis) <=0.12 SENSITIVE Sensitive     CEFTRIAXONE (meningitis) <=0.12 SENSITIVE Sensitive     * STREPTOCOCCUS PNEUMONIAE  Culture, blood (Routine x 2)     Status: None (Preliminary result)   Collection Time: 11/11/19  2:56 PM   Specimen: BLOOD RIGHT HAND  Result Value Ref Range Status   Specimen Description BLOOD RIGHT HAND  Final   Special Requests   Final    BOTTLES DRAWN AEROBIC ONLY Blood Culture results may not be optimal due to an inadequate volume of blood received in culture bottles   Culture   Final    NO GROWTH 3 DAYS Performed at [facility], [address], [place], [place] [address]    Report Status PENDING  Incomplete  Urine culture     Status: None   Collection Time: 11/11/19  3:11 PM   Specimen: Urine, Catheterized  Result Value Ref Range Status   Specimen Description URINE, CATHETERIZED  Final   Special Requests NONE  Final   Culture   Final    NO GROWTH Performed at [facility], [address], [place], [place] [address]    Report Status 11/12/2019 FINAL  Final  Respiratory Panel by RT PCR (Flu A&B, Covid) - Nasopharyngeal Swab     Status: None   Collection Time: 11/11/19  3:13 PM   Specimen: Nasopharyngeal Swab  Result Value Ref Range Status   SARS Coronavirus 2 by RT PCR NEGATIVE NEGATIVE Final    Comment: (NOTE) SARS-CoV-2 target nucleic acids are NOT DETECTED. The  SARS-CoV-2 RNA is generally detectable in upper respiratoy specimens during the acute phase of infection. The lowest concentration of SARS-CoV-2 viral copies this assay can detect is 131 copies/mL. A negative result does not preclude SARS-Cov-2 infection and should not be used as the sole basis for treatment or other patient management decisions. A negative result may  occur with  improper specimen collection/handling, submission of specimen other than nasopharyngeal swab, presence of viral mutation(s) within the areas targeted by this assay, and inadequate number of viral copies (<131 copies/mL). A negative result must be combined with clinical observations, patient history, and epidemiological information. The expected result is Negative. Fact Sheet for Patients:  PinkCheek.be Fact Sheet for Healthcare Providers:  GravelBags.it This test is not yet ap proved or cleared by the [place] FDA and  has been authorized for detection and/or diagnosis of SARS-CoV-2 by FDA under an Emergency Use Authorization (EUA). This EUA will remain  in effect (meaning this test can be used) for the duration of the COVID-19 declaration under Section 564(b)(1) of the Act, 21 U.S.C. section 360bbb-3(b)(1), unless the authorization is terminated or revoked sooner.    Influenza A by PCR NEGATIVE NEGATIVE Final   Influenza B by PCR NEGATIVE NEGATIVE Final    Comment: (NOTE) The Xpert Xpress SARS-CoV-2/FLU/RSV assay is intended as an aid in  the diagnosis of influenza from Nasopharyngeal swab specimens and  should not be used as a sole basis for treatment. Nasal washings and  aspirates are unacceptable for Xpert Xpress SARS-CoV-2/FLU/RSV  testing. Fact Sheet for Patients: PinkCheek.be Fact Sheet for Healthcare Providers: GravelBags.it This test is not yet approved or cleared by the [place] FDA and  has been authorized for detection and/or diagnosis of SARS-CoV-2 by  FDA under an Emergency Use Authorization (EUA). This EUA will remain  in effect (meaning this test can be used) for the duration of the  Covid-19 declaration under Section 564(b)(1) of the Act, 21  U.S.C. section 360bbb-3(b)(1), unless the authorization is  terminated or revoked. Performed at [facility], [place] [address] [place], [place] [address]   MRSA PCR Screening     Status: Abnormal   Collection Time: 11/11/19  8:23 PM   Specimen: Nasal Mucosa; Nasopharyngeal  Result Value Ref Range Status   MRSA by PCR POSITIVE (A) NEGATIVE Final    Comment: CRITICAL RESULT CALLED TO, READ BACK BY AND VERIFIED WITH: RN Cindee Salt [id number] @[phone]  THANEY Performed at [facility], [place] [address], [place], [place] [address]   Culture, respiratory (non-expectorated)     Status: None (Preliminary result)   Collection Time: 11/12/19  6:31 AM   Specimen: Tracheal Aspirate; Respiratory  Result Value Ref Range Status   Specimen Description TRACHEAL ASPIRATE  Final   Special Requests NONE  Final   Gram Stain   Final    FEW WBC PRESENT, PREDOMINANTLY PMN MODERATE GRAM POSITIVE COCCI FEW GRAM NEGATIVE RODS    Culture   Final    RARE STAPHYLOCOCCUS AUREUS CULTURE REINCUBATED FOR BETTER GROWTH SUSCEPTIBILITIES TO FOLLOW Performed at [facility], [place] [address], [place], [place] [address]    Report Status PENDING  Incomplete  Culture, blood (routine x 2)     Status: None (Preliminary result)   Collection Time: 11/13/19  6:21 PM   Specimen: BLOOD LEFT HAND  Result Value Ref Range Status   Specimen Description BLOOD LEFT HAND  Final   Special Requests   Final    BOTTLES DRAWN AEROBIC ONLY Blood Culture adequate volume   Culture   Final    NO GROWTH < 24 HOURS Performed at [facility], [place] [address] [place], [place] [address]    Report Status PENDING  Incomplete  Culture, blood (routine  x 2)     Status: None (Preliminary result)  Collection Time: 11/13/19  6:31 PM   Specimen: BLOOD LEFT HAND  Result Value Ref Range Status   Specimen Description BLOOD LEFT HAND  Final   Special Requests   Final    BOTTLES DRAWN AEROBIC ONLY Blood Culture adequate volume   Culture   Final    NO GROWTH < 24 HOURS Performed at [facility], [address], [place], [place] [address]    Report Status PENDING  Incomplete    Michel Bickers, MD [facility] [place] [facility] [phone] pager   [phone] cell 11/14/2019, 2:08 PM

## 2019-11-14 NOTE — Progress Notes (Signed)
Wasted 150cc of Fentanyl.  Witnesed by Prudencio Burly RN.  Irven [place], RN

## 2019-11-14 NOTE — Progress Notes (Signed)
Assisted tele visit to patient with family member.  Vega Withrow M, RN  

## 2019-11-14 NOTE — Progress Notes (Addendum)
NAME:  Susan [name], MRN:  [id number], DOB:  05-20-1961, LOS: 3 ADMISSION DATE:  11/11/2019, CONSULTATION DATE:  3/10 REFERRING MD:  Sabra Heck, CHIEF COMPLAINT:  Acute encephalopathy and respiratory failure    Brief History   59 year old female with history of schizophrenia, HIV, diabetes with diabetic neuropathy and falls at home.  EMS called to the patient's house after what was called out as a fall.  Upon arrival the patient was unresponsive with agonal respiratory pattern.  She was intubated by EMS, hypotensive on arrival to ER.  Admitted with a working diagnosis of acute toxic/metabolic encephalopathy felt possibly secondary to overdose complicated by aspiration pneumonia and septic shock  History of present illness   59 year old female, was found at bedside after what was felt to be a fall.  She was  unresponsive with agonal respiratory effort.  She was intubated by EMS, did not respond to IV Narcan, transported to [facility] emergency room. Her blood glucose on arrival to ER  , Lactic acid: 312 greater than 11, ethanol level less than 10, salicylate level less than 7, creatinine 2.1 calcium 0.98 portable chest x-ray showing endotracheal tube in satisfactory position with bilateral patchy pulmonary infiltrates right greater than left.  She remained encephalopathic, following arrival to the emergency room.  She would reach up with the left arm for the endotracheal tube, she was significantly hypotensive, this became even worse when propofol infusion was initiated.  Critical care asked to admit.  Initial QTC 427 ms  Past Medical History  Frequent falls at home,Tobacco abuse, diabetes with painful polyneuropathy, bipolar disease, GERD, HIV, schizophrenia, history of IV drug abuse, seizure disorder.   Significant Hospital Events   3/10: EMS summoned to house.  Intubated on arrival, hypotensive on arrival to emergency room  Consults:    Procedures:  Oral endotracheal tube 3/10 Right femoral  triple-lumen catheter 3/10 (ER) right internal jugular vein triple-lumen catheter 3/10  Significant Diagnostic Tests:  CT brain 3/10>>> Normal head CT EEG 3/10>>>Moderate to severe diffuse encephalopathy. No seizures Urine drug screen 3/10>>> 3/10 acetaminophen less than 10, salicylate level less than 7 Ethanol level less than 10 11/12/2019 Echo>> EF 20-25%, LV function severely diminished, Global Hypokinesis,LV hypertrophy, Grade 1 Diastolic Dysfunction, Elevated PASP, Dilated LA, MV regurgitation, small mobile density right coronary cusp  Micro Data:  Respiratory culture 3/10>> GS >> Mod G+ cocci, Few G- rods>> Blood culture 3/10 CD4 3/10 Urine Culture 3/10>> No growth  Antimicrobials:  Vancomycin 3/10 unasyn 3/10-3/11 Zosyn 3/11  Interim history/subjective:  No overnight issues. Off pressors overnight. Afebrile.   Objective   Blood pressure 113/64, pulse 100, temperature (!) 97.3 F (36.3 C), temperature source Bladder, resp. rate (!) 34, height 5\' 3"  (1.6 m), weight 109.4 kg, SpO2 98 %.    Vent Mode: PCV FiO2 (%):  [40 %-60 %] 40 % Set Rate:  [32 bmp] 32 bmp PEEP:  [10 cmH20] 10 cmH20 Plateau Pressure:  [19 cmH20-26 cmH20] 25 cmH20   Intake/Output Summary (Last 24 hours) at 11/14/2019 0912 Last data filed at 11/14/2019 0800 Gross per 24 hour  Intake 5958.12 ml  Output 1241 ml  Net 4717.12 ml   Filed Weights   11/11/19 1454 11/13/19 0404 11/14/19 0500  Weight: 114 kg 103.7 kg 109.4 kg    Examination: General: intubated, no distress, obese HENT: orally intubated Lungs: Bilateral Chest excursion, Coarse throughout with rales per bases, Cardiovascular: tachycardic, regular Abdomen: Soft nontender, +BS Extremities: No obvious deformities,Cool to touch, pulses thready, no edema  Neuro: opens eyes to voice, follows commands.  Resolved Hospital Problem list   Hyperglycemia  Assessment & Plan:   ARDS/acute hypoxic and hypercarbic respiratory failure in the setting  of what is likely aspiration pneumonia Current every day smoker Plan Full ventilator support, wean as tolerated Broaden broad spectrum antibiotics: Vanc, Zosyn VAP bundle PAD protocol, RASS goal -1 to -2 Sat Goals > 92% Continue Pulmicort and DuoNebs  Septic shock.Echo with EF 20-25% - potentially septic cardiomyopathy Plan Source: Strep. Pneumoniae pneumonia and bacteremia. Vegetation>> small mobile density per right coronary cusp 0.82 cm by 0.31 cm. Continue unasyn per ID.  Needs TEE. Off pressors now Discontinue arterial line, foley catheter. Will obtain peripherals before d/c of CVC  Acute toxic and metabolic encephalopathy.  She has a history of schizophrenia & seizure disorder.home medications Abilify, Cogentin, doxepin, Neurontin and BuSpar all on her medicine profile.  -EEG - no seizure activity -Serum ethanol, tylenol and salicylate unremarkable -UDS + cannabinol Plan Continue home AEDs Neuro checks per unit protocol Supportive care Minimize sedation. Currently on fentanyl, but consider precedex if she needs something. Vs prn fentanyl.   Rhabdomyolysis CKD downtrending Will d/c IVF  Acute renal failure: Suspect 2/2 shock Plan Cont vasoactive gtts MAP > 65 Strict I&O Renal dose meds Trend BMET Replete electrolytes as needed. Replaced K today.  Mildly elevated INR -  Normal LFT's ?hepatic congestion vs fever/ hyper-metabolism/ clearing of clotting factors more rapidly Plan: Will trend INR - improving, down to 1.4 today. No signs of bleeding  HIV - well controlled, continue home meds  Best practice:  Diet: NPO, getting tube feeds Pain/Anxiety/Delirium protocol (if indicated): fentanyl ordered VAP protocol (if indicated): ordered DVT prophylaxis: [facility] GI prophylaxis: PPI Glucose control: ssi Mobility: BR Code Status: full code  Family Communication: daughter udpated by phone 3/12. Patient's visitor is listed as sister but is actually just a friend.  Daughter is DPOA.  Disposition: to ICU   Labs   CBC: Recent Labs  Lab 11/11/19 1453 11/11/19 1501 11/12/19 0441 11/12/19 0526 11/12/19 1504 11/13/19 0402 11/14/19 0411  WBC 4.7  --  4.3  --   --  8.5 5.4  NEUTROABS 1.4*  --   --   --   --   --   --   HGB 16.4*   < > 15.0 15.3* 13.9 13.0 11.0*  HCT 53.2*   < > 46.8* 45.0 41.0 39.3 34.4*  MCV 91.9  --  86.2  --   --  84.5 86.6  PLT 262  --  177  --   --  138* 86*   < > = values in this interval not displayed.    Basic Metabolic Panel: Recent Labs  Lab 11/11/19 1453 11/11/19 1501 11/12/19 0441 11/12/19 0526 11/12/19 0830 11/12/19 0830 11/12/19 0833 11/12/19 1504 11/12/19 1644 11/13/19 0044 11/13/19 0820 11/13/19 1116 11/14/19 0411  NA 140   < >  --    < > 141   < >  --  142 143 141 141  --  141  K 3.7   < >  --    < > 4.0   < >  --  4.0 4.1 4.5 4.4  --  3.3*  CL 101   < >  --   --  109  --   --   --  109 109 107  --  105  CO2 17*   < >  --   --  19*  --   --   --  20* 20* 21*  --  24  GLUCOSE 324*   < >  --   --  104*  --   --   --  134* 129* 94  --  153*  BUN 15   < >  --   --  23*  --   --   --  26* 30* 32*  --  33*  CREATININE 2.46*   < >  --   --  2.28*  --   --   --  2.51* 2.50* 2.50*  --  2.47*  CALCIUM 8.4*   < >  --   --  7.6*  --   --   --  7.3* 7.2* 7.2*  --  7.2*  MG 1.9  --  1.9  --   --   --  1.9  --   --   --   --  1.8 2.3  PHOS 4.6  --   --   --   --   --   --   --   --   --   --   --   --    < > = values in this interval not displayed.   GFR: Estimated Creatinine Clearance: 29.8 mL/min (A) (by C-G formula based on SCr of 2.47 mg/dL (H)). Recent Labs  Lab 11/11/19 1453 11/11/19 1453 11/11/19 1738 11/11/19 2029 11/11/19 2332 11/12/19 0441 11/13/19 0402 11/14/19 0411  WBC 4.7  --   --   --   --  4.3 8.5 5.4  LATICACIDVEN >11.0*   < > 5.4* 3.9* 2.8*  --   --  1.2   < > = values in this interval not displayed.    Liver Function Tests: Recent Labs  Lab 11/11/19 1453 11/14/19 0411  AST  43* 42*  ALT 18 18  ALKPHOS 89 103  BILITOT 0.4 0.6  PROT 5.5* 4.9*  ALBUMIN 2.4* 1.6*   No results for input(s): LIPASE, AMYLASE in the last 168 hours. Recent Labs  Lab 11/13/19 1116  AMMONIA 38*    ABG    Component Value Date/Time   PHART 7.274 (L) 11/12/2019 1504   PCO2ART 50.5 (H) 11/12/2019 1504   PO2ART 150.0 (H) 11/12/2019 1504   HCO3 23.1 11/12/2019 1504   TCO2 25 11/12/2019 1504   ACIDBASEDEF 4.0 (H) 11/12/2019 1504   O2SAT 99.0 11/12/2019 1504     Coagulation Profile: Recent Labs  Lab 11/11/19 1453 11/13/19 0402 11/14/19 0411  INR 1.3* 2.1* 1.4*    Cardiac Enzymes: Recent Labs  Lab 11/11/19 1453 11/12/19 0441 11/12/19 1644 11/13/19 1116  CKTOTAL 868* 1,037* 726* 524*    HbA1C: Hgb A1c MFr Bld  Date/Time Value Ref Range Status  11/11/2019 05:38 PM 12.3 (H) 4.8 - 5.6 % Final    Comment:    (NOTE) Pre diabetes:          5.7%-6.4% Diabetes:              >6.4% Glycemic control for   <7.0% adults with diabetes   02/17/2019 03:09 AM 9.5 (H) 4.8 - 5.6 % Final    Comment:    (NOTE) Pre diabetes:          5.7%-6.4% Diabetes:              >6.4% Glycemic control for   <7.0% adults with diabetes     CBG: Recent Labs  Lab 11/13/19 1600 11/13/19 2020 11/14/19 0013 11/14/19 0458 11/14/19 [place]  103* 151* 136* 130* 132*    Critical care time  The patient is critically ill with multiple organ systems failure and requires high complexity decision making for assessment and support, frequent evaluation and titration of therapies, application of advanced monitoring technologies and extensive interpretation of multiple databases.   Critical Care Time devoted to patient care services described in this note is 42 minutes. This time reflects time of care of this [place] . This critical care time does not reflect separately billable procedures or procedure time, teaching time or supervisory time of PA/NP/Med student/Med Resident etc  but could involve care discussion time.  Leone [facility] 11/14/2019 9:12 AM  Pager: [phone] After hours pager: [phone]

## 2019-11-15 DIAGNOSIS — R7881 Bacteremia: Secondary | ICD-10-CM | POA: Diagnosis not present

## 2019-11-15 DIAGNOSIS — I358 Other nonrheumatic aortic valve disorders: Secondary | ICD-10-CM | POA: Diagnosis not present

## 2019-11-15 DIAGNOSIS — J9601 Acute respiratory failure with hypoxia: Secondary | ICD-10-CM | POA: Diagnosis not present

## 2019-11-15 DIAGNOSIS — B9561 Methicillin susceptible Staphylococcus aureus infection as the cause of diseases classified elsewhere: Secondary | ICD-10-CM

## 2019-11-15 DIAGNOSIS — B20 Human immunodeficiency virus [HIV] disease: Secondary | ICD-10-CM | POA: Diagnosis not present

## 2019-11-15 LAB — POCT I-STAT 7, (LYTES, BLD GAS, ICA,H+H)
Acid-Base Excess: 2 mmol/L (ref 0.0–2.0)
Bicarbonate: 26.4 mmol/L (ref 20.0–28.0)
Calcium, Ion: 1.15 mmol/L (ref 1.15–1.40)
HCT: 31 % — ABNORMAL LOW (ref 36.0–46.0)
Hemoglobin: 10.5 g/dL — ABNORMAL LOW (ref 12.0–15.0)
O2 Saturation: 90 %
Patient temperature: 98.7
Potassium: 2.9 mmol/L — ABNORMAL LOW (ref 3.5–5.1)
Sodium: 144 mmol/L (ref 135–145)
TCO2: 28 mmol/L (ref 22–32)
pCO2 arterial: 38.6 mmHg (ref 32.0–48.0)
pH, Arterial: 7.444 (ref 7.350–7.450)
pO2, Arterial: 55 mmHg — ABNORMAL LOW (ref 83.0–108.0)

## 2019-11-15 LAB — CBC
HCT: 34.8 % — ABNORMAL LOW (ref 36.0–46.0)
Hemoglobin: 11.1 g/dL — ABNORMAL LOW (ref 12.0–15.0)
MCH: 27.2 pg (ref 26.0–34.0)
MCHC: 31.9 g/dL (ref 30.0–36.0)
MCV: 85.3 fL (ref 80.0–100.0)
Platelets: 78 10*3/uL — ABNORMAL LOW (ref 150–400)
RBC: 4.08 MIL/uL (ref 3.87–5.11)
RDW: 15 % (ref 11.5–15.5)
WBC: 7.5 10*3/uL (ref 4.0–10.5)
nRBC: 0 % (ref 0.0–0.2)

## 2019-11-15 LAB — GLUCOSE, CAPILLARY
Glucose-Capillary: 216 mg/dL — ABNORMAL HIGH (ref 70–99)
Glucose-Capillary: 235 mg/dL — ABNORMAL HIGH (ref 70–99)
Glucose-Capillary: 207 mg/dL — ABNORMAL HIGH (ref 70–99)
Glucose-Capillary: 220 mg/dL — ABNORMAL HIGH (ref 70–99)
Glucose-Capillary: 185 mg/dL — ABNORMAL HIGH (ref 70–99)

## 2019-11-15 LAB — BASIC METABOLIC PANEL
Anion gap: 12 (ref 5–15)
BUN: 31 mg/dL — ABNORMAL HIGH (ref 6–20)
CO2: 24 mmol/L (ref 22–32)
Calcium: 7.8 mg/dL — ABNORMAL LOW (ref 8.9–10.3)
Chloride: 107 mmol/L (ref 98–111)
Creatinine, Ser: 2.22 mg/dL — ABNORMAL HIGH (ref 0.44–1.00)
GFR calc Af Amer: 28 mL/min — ABNORMAL LOW (ref >60)
GFR calc non Af Amer: 24 mL/min — ABNORMAL LOW (ref >60)
Glucose, Bld: 248 mg/dL — ABNORMAL HIGH (ref 70–99)
Potassium: 3.4 mmol/L — ABNORMAL LOW (ref 3.5–5.1)
Sodium: 143 mmol/L (ref 135–145)

## 2019-11-15 LAB — PROTIME-INR
INR: 1.1 (ref 0.8–1.2)
Prothrombin Time: 14.5 seconds (ref 11.4–15.2)

## 2019-11-15 LAB — HIV-1 RNA QUANT-NO REFLEX-BLD
HIV 1 RNA Quant: 60 copies/mL
LOG10 HIV-1 RNA: 1.778 log10copy/mL

## 2019-11-15 MED ORDER — SERTRALINE HCL 50 MG PO TABS
50.0000 mg | ORAL_TABLET | Freq: Every day | ORAL | Status: DC
  Administered 2019-11-15 – 2019-12-29 (×45): 50 mg
  Filled 2019-11-15 (×46): qty 1

## 2019-11-15 MED ORDER — BUSPIRONE HCL 10 MG PO TABS
15.0000 mg | ORAL_TABLET | Freq: Three times a day (TID) | ORAL | Status: DC
  Administered 2019-11-15 – 2020-01-06 (×156): 15 mg
  Filled 2019-11-15 (×2): qty 2
  Filled 2019-11-15 (×3): qty 1
  Filled 2019-11-15 (×3): qty 2
  Filled 2019-11-15: qty 1
  Filled 2019-11-15 (×2): qty 2
  Filled 2019-11-15 (×2): qty 1
  Filled 2019-11-15 (×2): qty 2
  Filled 2019-11-15: qty 1
  Filled 2019-11-15: qty 2
  Filled 2019-11-15: qty 1
  Filled 2019-11-15: qty 2
  Filled 2019-11-15 (×3): qty 1
  Filled 2019-11-15 (×4): qty 2
  Filled 2019-11-15: qty 1
  Filled 2019-11-15: qty 2
  Filled 2019-11-15: qty 1
  Filled 2019-11-15 (×4): qty 2
  Filled 2019-11-15 (×2): qty 1
  Filled 2019-11-15 (×5): qty 2
  Filled 2019-11-15 (×3): qty 1
  Filled 2019-11-15 (×2): qty 2
  Filled 2019-11-15: qty 1
  Filled 2019-11-15 (×3): qty 2
  Filled 2019-11-15 (×2): qty 1
  Filled 2019-11-15: qty 2
  Filled 2019-11-15: qty 1
  Filled 2019-11-15 (×7): qty 2
  Filled 2019-11-15: qty 1
  Filled 2019-11-15 (×2): qty 2
  Filled 2019-11-15 (×3): qty 1
  Filled 2019-11-15 (×7): qty 2
  Filled 2019-11-15: qty 1
  Filled 2019-11-15 (×7): qty 2
  Filled 2019-11-15: qty 1
  Filled 2019-11-15: qty 2
  Filled 2019-11-15 (×3): qty 1
  Filled 2019-11-15: qty 2
  Filled 2019-11-15 (×3): qty 1
  Filled 2019-11-15: qty 2
  Filled 2019-11-15: qty 1
  Filled 2019-11-15 (×10): qty 2
  Filled 2019-11-15 (×2): qty 1
  Filled 2019-11-15 (×4): qty 2
  Filled 2019-11-15: qty 1
  Filled 2019-11-15: qty 2
  Filled 2019-11-15: qty 1
  Filled 2019-11-15 (×2): qty 2
  Filled 2019-11-15 (×3): qty 1
  Filled 2019-11-15 (×3): qty 2
  Filled 2019-11-15 (×3): qty 1
  Filled 2019-11-15: qty 2
  Filled 2019-11-15 (×3): qty 1
  Filled 2019-11-15: qty 2
  Filled 2019-11-15: qty 1
  Filled 2019-11-15 (×2): qty 2
  Filled 2019-11-15 (×2): qty 1
  Filled 2019-11-15 (×7): qty 2
  Filled 2019-11-15: qty 1
  Filled 2019-11-15 (×2): qty 2
  Filled 2019-11-15 (×2): qty 1
  Filled 2019-11-15 (×2): qty 2
  Filled 2019-11-15: qty 1
  Filled 2019-11-15: qty 2
  Filled 2019-11-15 (×3): qty 1
  Filled 2019-11-15: qty 2
  Filled 2019-11-15: qty 1
  Filled 2019-11-15 (×3): qty 2
  Filled 2019-11-15 (×3): qty 1

## 2019-11-15 MED ORDER — CLONIDINE HCL 0.2 MG PO TABS
0.2000 mg | ORAL_TABLET | Freq: Two times a day (BID) | ORAL | Status: DC
  Administered 2019-11-15 – 2019-11-16 (×4): 0.2 mg
  Filled 2019-11-15 (×4): qty 1

## 2019-11-15 MED ORDER — HYDROXYZINE HCL 25 MG PO TABS: 25.0000 mg | ORAL_TABLET | Freq: Three times a day (TID) | ORAL | Status: DC | PRN

## 2019-11-15 MED ORDER — DOXEPIN HCL 50 MG PO CAPS
50.0000 mg | ORAL_CAPSULE | Freq: Every day | ORAL | Status: DC
  Administered 2019-11-15 – 2020-01-05 (×52): 50 mg
  Filled 2019-11-15 (×54): qty 1

## 2019-11-15 MED ORDER — GABAPENTIN 250 MG/5ML PO SOLN
300.0000 mg | Freq: Three times a day (TID) | ORAL | Status: DC
  Administered 2019-11-15 – 2020-01-06 (×150): 300 mg
  Filled 2019-11-15 (×167): qty 6

## 2019-11-15 MED ORDER — AMLODIPINE BESYLATE 10 MG PO TABS
10.0000 mg | ORAL_TABLET | Freq: Every day | ORAL | Status: DC
  Administered 2019-11-15 – 2019-11-16 (×2): 10 mg
  Filled 2019-11-15 (×2): qty 1

## 2019-11-15 MED ORDER — BENZTROPINE MESYLATE 1 MG PO TABS
0.5000 mg | ORAL_TABLET | Freq: Every day | ORAL | Status: DC
  Administered 2019-11-15 – 2020-01-05 (×52): 0.5 mg
  Filled 2019-11-15 (×51): qty 1

## 2019-11-15 MED ORDER — QUETIAPINE FUMARATE 50 MG PO TABS: 200.0000 mg | ORAL_TABLET | Freq: Two times a day (BID) | ORAL | Status: DC

## 2019-11-15 MED ORDER — QUETIAPINE FUMARATE 50 MG PO TABS
200.0000 mg | ORAL_TABLET | Freq: Two times a day (BID) | ORAL | Status: DC
  Administered 2019-11-15 (×2): 200 mg
  Filled 2019-11-15 (×2): qty 4

## 2019-11-15 MED ORDER — QUETIAPINE FUMARATE 50 MG PO TABS: 400.0000 mg | ORAL_TABLET | Freq: Every day | ORAL | Status: DC

## 2019-11-15 MED ORDER — NOREPINEPHRINE 4 MG/250ML-% IV SOLN
0.0000 ug/min | INTRAVENOUS | Status: DC
  Administered 2019-11-17: 8 ug/min via INTRAVENOUS
  Administered 2019-11-17: 5 ug/min via INTRAVENOUS
  Administered 2019-11-18: 2 ug/min via INTRAVENOUS
  Administered 2019-11-19 – 2019-11-20 (×2): 5 ug/min via INTRAVENOUS
  Filled 2019-11-15 (×5): qty 250

## 2019-11-15 MED ORDER — POTASSIUM CHLORIDE 10 MEQ/50ML IV SOLN
10.0000 meq | INTRAVENOUS | Status: AC
  Administered 2019-11-15 (×2): 10 meq via INTRAVENOUS
  Filled 2019-11-15 (×2): qty 50

## 2019-11-15 MED ORDER — ROSUVASTATIN CALCIUM 20 MG PO TABS
20.0000 mg | ORAL_TABLET | Freq: Every day | ORAL | Status: DC
  Administered 2019-11-15 – 2020-01-05 (×51): 20 mg
  Filled 2019-11-15 (×53): qty 1

## 2019-11-15 NOTE — Progress Notes (Signed)
Assisted tele visit to patient with family member.  Lenox Ahr, RN   Attempted video chat, after 10 mins of waiting for family member to join call was ended due to no one accepting the link

## 2019-11-15 NOTE — Progress Notes (Addendum)
Pt's daughter called for update and informed this RN that psych MD called and told the daughter to have the pt stop taking seroquel outpatient. Dr. Josph Macho @ [facility] [address] is her psych physician. I let her daughter know that I would pass this information to the day team.

## 2019-11-15 NOTE — Progress Notes (Signed)
NAME:  Susan [name], MRN:  [id number], DOB:  1960/09/27, LOS: 4 ADMISSION DATE:  11/11/2019, CONSULTATION DATE:  3/10 REFERRING MD:  Sabra Heck, CHIEF COMPLAINT:  Acute encephalopathy and respiratory failure    Brief History   59 year old female with history of schizophrenia, HIV, diabetes with diabetic neuropathy and falls at home.  EMS called to the patient's house after what was called out as a fall.  Upon arrival the patient was unresponsive with agonal respiratory pattern.  She was intubated by EMS, hypotensive on arrival to ER.  Admitted with a working diagnosis of acute toxic/metabolic encephalopathy felt possibly secondary to overdose complicated by aspiration pneumonia and septic shock  History of present illness   59 year old female, was found at bedside after what was felt to be a fall.  She was  unresponsive with agonal respiratory effort.  She was intubated by EMS, did not respond to IV Narcan, transported to [facility] emergency room. Her blood glucose on arrival to ER  , Lactic acid: 312 greater than 11, ethanol level less than 10, salicylate level less than 7, creatinine 2.1 calcium 0.98 portable chest x-ray showing endotracheal tube in satisfactory position with bilateral patchy pulmonary infiltrates right greater than left.  She remained encephalopathic, following arrival to the emergency room.  She would reach up with the left arm for the endotracheal tube, she was significantly hypotensive, this became even worse when propofol infusion was initiated.  Critical care asked to admit.  Initial QTC 427 ms  Past Medical History  Frequent falls at home,Tobacco abuse, diabetes with painful polyneuropathy, bipolar disease, GERD, HIV, schizophrenia, history of IV drug abuse, seizure disorder.   Significant Hospital Events   3/10: EMS summoned to house.  Intubated on arrival, hypotensive on arrival to emergency room  Consults:    Procedures:  Oral endotracheal tube 3/10 Right femoral  triple-lumen catheter 3/10 (ER) right internal jugular vein triple-lumen catheter 3/10  Significant Diagnostic Tests:  CT brain 3/10>>> Normal head CT EEG 3/10>>>Moderate to severe diffuse encephalopathy. No seizures Urine drug screen 3/10>>> 3/10 acetaminophen less than 10, salicylate level less than 7 Ethanol level less than 10 11/12/2019 Echo>> EF 20-25%, LV function severely diminished, Global Hypokinesis,LV hypertrophy, Grade 1 Diastolic Dysfunction, Elevated PASP, Dilated LA, MV regurgitation, small mobile density right coronary cusp  Micro Data:  Respiratory culture 3/10>> GS >> Mod G+ cocci, Few G- rods>> Blood culture 3/10 CD4 3/10 Urine Culture 3/10>> No growth  Antimicrobials:  Vancomycin 3/10 unasyn 3/10-3/11 Zosyn 3/11  Interim history/subjective:  This morning on SBT. Awake alert and oriented. Passing SBT.  Objective   Blood pressure 118/61, pulse 92, temperature 98.5 F (36.9 C), temperature source Axillary, resp. rate (!) 33, height 5\' 3"  (1.6 m), weight 107.2 kg, SpO2 100 %.    Vent Mode: CPAP;PSV FiO2 (%):  [40 %] 40 % Set Rate:  [22 bmp] 22 bmp PEEP:  [5 cmH20-8 cmH20] 5 cmH20 Pressure Support:  [5 cmH20] 5 cmH20 Plateau Pressure:  [25 cmH20-28 cmH20] 28 cmH20   Intake/Output Summary (Last 24 hours) at 11/15/2019 0911 Last data filed at 11/15/2019 0600 Gross per 24 hour  Intake 1363.59 ml  Output 1225 ml  Net 138.59 ml   Filed Weights   11/13/19 0404 11/14/19 0500 11/15/19 0435  Weight: 103.7 kg 109.4 kg 107.2 kg    Examination: General: intubated, no distress, obese HENT: endotracheal tube to vent Lungs: symmetry bilateral chest wall excursion. No wheezes or crackles Cardiovascular: tachycardic, regular Abdomen: Soft nontender, +BS  Extremities: no edema Neuro: awake, alert, follows commands  Resolved Hospital Problem list   Hyperglycemia Septic shock Rhabdomyolysis Acute metabolic encephalopathy Elevated INR  Assessment & Plan:    ARDS/acute hypoxic and hypercarbic respiratory failure Current every day smoker Plan Full ventilator support, wean as tolerated On unasyn and vanco per ID as below VAP bundle PAD protocol, RASS goal -1 to -2 Sat Goals > 92% Continue Pulmicort and DuoNebs Potential extubation today.   Septic shock.Echo with EF 20-25% - potentially septic cardiomyopathy Plan Shock resolved Has staph and Streptococcus bacteremia Continue unasyn vancomycin per ID.  Needs TEE.  Appreciate ID recs.   Acute toxic and metabolic encephalopathy.  She has a history of schizophrenia & seizure disorder.home medications Abilify, Cogentin, doxepin, Neurontin and BuSpar all on her medicine profile.  -EEG - no seizure activity -Serum ethanol, tylenol and salicylate unremarkable -UDS + cannabinol Plan Continue home AEDs. Resuming home meds On precedex but awake and following commands  Acute renal failure: Suspect 2/2 shock Plan Strict I&O Renal dose meds Trend BMET Replete electrolytes as needed. Replaced K today.  HIV - well controlled, continue home meds  HTN - will resume home meds today  Best practice:  Diet: tube feeds. Will need swallow eval if extubated Pain/Anxiety/Delirium protocol (if indicated): precedex VAP protocol (if indicated): ordered DVT prophylaxis: [facility] GI prophylaxis: PPI Glucose control: ssi Mobility: BR Code Status: full code  Family Communication: daughter udpated by phone 3/12. Patient's visitor is listed as sister but is actually just a friend. Daughter is DPOA. Will updated today.  Disposition: to ICU   Labs   CBC: Recent Labs  Lab 11/11/19 1453 11/11/19 1501 11/12/19 0441 11/12/19 0441 11/12/19 0526 11/12/19 1504 11/13/19 0402 11/14/19 0411 11/15/19 0425  WBC 4.7  --  4.3  --   --   --  8.5 5.4 7.5  NEUTROABS 1.4*  --   --   --   --   --   --   --   --   HGB 16.4*   < > 15.0   < > 15.3* 13.9 13.0 11.0* 11.1*  HCT 53.2*   < > 46.8*   < > 45.0 41.0 39.3  34.4* 34.8*  MCV 91.9  --  86.2  --   --   --  84.5 86.6 85.3  PLT 262  --  177  --   --   --  138* 86* 78*   < > = values in this interval not displayed.    Basic Metabolic Panel: Recent Labs  Lab 11/11/19 1453 11/11/19 1501 11/12/19 0441 11/12/19 0526 11/12/19 0830 11/12/19 0830 11/12/19 0833 11/12/19 1504 11/12/19 1644 11/13/19 0044 11/13/19 0820 11/13/19 1116 11/14/19 0411  NA 140   < >  --    < > 141   < >  --  142 143 141 141  --  141  K 3.7   < >  --    < > 4.0   < >  --  4.0 4.1 4.5 4.4  --  3.3*  CL 101   < >  --   --  109  --   --   --  109 109 107  --  105  CO2 17*   < >  --   --  19*  --   --   --  20* 20* 21*  --  24  GLUCOSE 324*   < >  --   --  104*  --   --   --  134* 129* 94  --  153*  BUN 15   < >  --   --  23*  --   --   --  26* 30* 32*  --  33*  CREATININE 2.46*   < >  --   --  2.28*  --   --   --  2.51* 2.50* 2.50*  --  2.47*  CALCIUM 8.4*   < >  --   --  7.6*  --   --   --  7.3* 7.2* 7.2*  --  7.2*  MG 1.9  --  1.9  --   --   --  1.9  --   --   --   --  1.8 2.3  PHOS 4.6  --   --   --   --   --   --   --   --   --   --   --   --    < > = values in this interval not displayed.   GFR: Estimated Creatinine Clearance: 29.5 mL/min (A) (by C-G formula based on SCr of 2.47 mg/dL (H)). Recent Labs  Lab 11/11/19 1453 11/11/19 1738 11/11/19 2029 11/11/19 2332 11/12/19 0441 11/13/19 0402 11/14/19 0411 11/15/19 0425  WBC   < >  --   --   --  4.3 8.5 5.4 7.5  LATICACIDVEN  --  5.4* 3.9* 2.8*  --   --  1.2  --    < > = values in this interval not displayed.    Liver Function Tests: Recent Labs  Lab 11/11/19 1453 11/14/19 0411  AST 43* 42*  ALT 18 18  ALKPHOS 89 103  BILITOT 0.4 0.6  PROT 5.5* 4.9*  ALBUMIN 2.4* 1.6*   No results for input(s): LIPASE, AMYLASE in the last 168 hours. Recent Labs  Lab 11/13/19 1116  AMMONIA 38*    ABG    Component Value Date/Time   PHART 7.274 (L) 11/12/2019 1504   PCO2ART 50.5 (H) 11/12/2019 1504    PO2ART 150.0 (H) 11/12/2019 1504   HCO3 23.1 11/12/2019 1504   TCO2 25 11/12/2019 1504   ACIDBASEDEF 4.0 (H) 11/12/2019 1504   O2SAT 99.0 11/12/2019 1504     Coagulation Profile: Recent Labs  Lab 11/11/19 1453 11/13/19 0402 11/14/19 0411 11/15/19 0425  INR 1.3* 2.1* 1.4* 1.1    Cardiac Enzymes: Recent Labs  Lab 11/11/19 1453 11/12/19 0441 11/12/19 1644 11/13/19 1116  CKTOTAL 868* 1,037* 726* 524*    HbA1C: Hgb A1c MFr Bld  Date/Time Value Ref Range Status  11/11/2019 05:38 PM 12.3 (H) 4.8 - 5.6 % Final    Comment:    (NOTE) Pre diabetes:          5.7%-6.4% Diabetes:              >6.4% Glycemic control for   <7.0% adults with diabetes   02/17/2019 03:09 AM 9.5 (H) 4.8 - 5.6 % Final    Comment:    (NOTE) Pre diabetes:          5.7%-6.4% Diabetes:              >6.4% Glycemic control for   <7.0% adults with diabetes     CBG: Recent Labs  Lab 11/14/19 1551 11/14/19 1940 11/14/19 2328 11/15/19 0433 11/15/19 0812  GLUCAP 170* 172* 178* 216* 235*    Critical care time  The patient is critically ill with multiple organ systems failure and requires high  complexity decision making for assessment and support, frequent evaluation and titration of therapies, application of advanced monitoring technologies and extensive interpretation of multiple databases.   Critical Care Time devoted to patient care services described in this note is 47 minutes. This time reflects time of care of this Hayden . This critical care time does not reflect separately billable procedures or procedure time, teaching time or supervisory time of PA/NP/Med student/Med Resident etc but could involve care discussion time.  [facility] 11/15/2019 9:11 AM  Pager: [phone] After hours pager: [phone]

## 2019-11-15 NOTE — Progress Notes (Signed)
Pt has been tachypneic since at least 1900, restarted precedex gtt in attempt to achieve vent compliance. Pt appears comfortable, but an hour later is still tachypneic in the 40's. No PRN sedation available. Notified [facility]Link.

## 2019-11-15 NOTE — Progress Notes (Signed)
[place] Progress Note Patient Name: LISANNE COBB DOB: [birth year] MRN: [id number]   Date of Service  11/15/2019  HPI/Events of Note  RN enquires whether Q4H EKG can be d/c'd given that this was for monitoring s/p overdose but it has since been ~4 days.  Reviewed most recent EKG (QTc 382 ms). No significant abnormalities.   eICU Interventions  D/c'd Q4H EKG monitoring.     Intervention Category Minor Interventions: Other:  Charlott Rakes 11/15/2019, 7:56 PM

## 2019-11-15 NOTE — Progress Notes (Signed)
[facility]Link Physician-Brief Progress Note Patient Name: ANTIA BERISH DOB: 12-31-1960 MRN: [id number]   Date of Service  11/15/2019  HPI/Events of Note  Called for RR 38 / min on ventilator.   Vent Mode: PCV FiO2 (%):  [40 %] 40 % Set Rate:  [22 bmp] 22 bmp PEEP:  [5 cmH20-8 cmH20] 5 cmH20 Pressure Support:  [5 cmH20] 5 cmH20 Plateau Pressure:  [26 cmH20-32 cmH20] 27 cmH20  She is achieving Vt of 470-490cc on these settings with minute ventilation of 17-18 L/min.  BP 83/44. HR 91. Repeat BP shows SBP 100.   eICU Interventions  Will first obtain an ABG to rule out an underlying reason for her hyperventilation such as hypercarbia or metabolic acidosis, especially in setting of lower BP measurement although repeat improved.  If this is unrevealing, will try Precedex for control of agitation as presumed underlying cause.  Given variability in BP I will order levophed for RN to have available should her MAP drop to < 65 mmHg consistently.  If UOP drops off RN to notify MD for possible fluid bolus.     Intervention Category Major Interventions: Hypotension - evaluation and management;Respiratory failure - evaluation and management  Charlott Rakes 11/15/2019, 8:28 PM

## 2019-11-15 NOTE — Progress Notes (Signed)
Video call with daughter and family. Patient unresponsive, on ventilator.

## 2019-11-15 NOTE — Progress Notes (Signed)
Patient ID: Susan [name], female   DOB: April 27, 1961, 59 y.o.   MRN: [id number]          [place] [facility] Infectious Disease    Date of Admission:  11/11/2019   Day 5 vancomycin        Day 5 ampicillin sulbactam  Susan [name] remains intubated but is now afebrile.  She was found down at home with acute hypoxic respiratory failure and pneumonia.  She has pneumococcal bacteremia but sputum is growing staph aureus with antibiotic susceptibilities pending.  I will continue current antibiotics for now.  TTE revealed a small aortic valve vegetation.         Michel Bickers, MD [facility] [phone] pager   [phone] cell 11/15/2019, 1:25 PM

## 2019-11-15 NOTE — Progress Notes (Signed)
[place] Progress Note Patient Name: Susan [name] DOB: 1961/04/21 MRN: [id number]   Date of Service  11/15/2019  HPI/Events of Note  K 2.9 and downtrending.. Creatinine is in 2's.   eICU Interventions  Will order just 20 mEq potassium to be given via central line. Lower replacement amount given renal failure. Recheck BMP in AM.     Intervention Category Major Interventions: Electrolyte abnormality - evaluation and management  Charlott Rakes 11/15/2019, 9:55 PM

## 2019-11-15 NOTE — Progress Notes (Signed)
After pt received her morning medications pt became unresponsive. Vital Signs remain stable.   Informed Dr. Shearon Stalls.  No new orders.  Will monitor pt closely.

## 2019-11-16 ENCOUNTER — Inpatient Hospital Stay (HOSPITAL_COMMUNITY): Payer: Medicare Other

## 2019-11-16 DIAGNOSIS — J13 Pneumonia due to Streptococcus pneumoniae: Secondary | ICD-10-CM | POA: Diagnosis not present

## 2019-11-16 DIAGNOSIS — A419 Sepsis, unspecified organism: Secondary | ICD-10-CM | POA: Diagnosis not present

## 2019-11-16 DIAGNOSIS — R7881 Bacteremia: Secondary | ICD-10-CM | POA: Diagnosis not present

## 2019-11-16 DIAGNOSIS — B953 Streptococcus pneumoniae as the cause of diseases classified elsewhere: Secondary | ICD-10-CM | POA: Diagnosis not present

## 2019-11-16 DIAGNOSIS — I330 Acute and subacute infective endocarditis: Secondary | ICD-10-CM | POA: Diagnosis not present

## 2019-11-16 DIAGNOSIS — J9601 Acute respiratory failure with hypoxia: Secondary | ICD-10-CM | POA: Diagnosis not present

## 2019-11-16 DIAGNOSIS — R6521 Severe sepsis with septic shock: Secondary | ICD-10-CM | POA: Diagnosis not present

## 2019-11-16 LAB — CBC
HCT: 33.2 % — ABNORMAL LOW (ref 36.0–46.0)
Hemoglobin: 10.9 g/dL — ABNORMAL LOW (ref 12.0–15.0)
MCH: 27.7 pg (ref 26.0–34.0)
MCHC: 32.8 g/dL (ref 30.0–36.0)
MCV: 84.5 fL (ref 80.0–100.0)
Platelets: 89 10*3/uL — ABNORMAL LOW (ref 150–400)
RBC: 3.93 MIL/uL (ref 3.87–5.11)
RDW: 14.9 % (ref 11.5–15.5)
WBC: 12 10*3/uL — ABNORMAL HIGH (ref 4.0–10.5)
nRBC: 0.2 % (ref 0.0–0.2)

## 2019-11-16 LAB — BASIC METABOLIC PANEL
Anion gap: 11 (ref 5–15)
BUN: 34 mg/dL — ABNORMAL HIGH (ref 6–20)
CO2: 26 mmol/L (ref 22–32)
Calcium: 7.8 mg/dL — ABNORMAL LOW (ref 8.9–10.3)
Chloride: 108 mmol/L (ref 98–111)
Creatinine, Ser: 2.17 mg/dL — ABNORMAL HIGH (ref 0.44–1.00)
GFR calc Af Amer: 28 mL/min — ABNORMAL LOW (ref >60)
GFR calc non Af Amer: 24 mL/min — ABNORMAL LOW (ref >60)
Glucose, Bld: 255 mg/dL — ABNORMAL HIGH (ref 70–99)
Potassium: 4.5 mmol/L (ref 3.5–5.1)
Sodium: 145 mmol/L (ref 135–145)

## 2019-11-16 LAB — PROTIME-INR
INR: 1.1 (ref 0.8–1.2)
Prothrombin Time: 14.6 seconds (ref 11.4–15.2)

## 2019-11-16 LAB — GLUCOSE, CAPILLARY
Glucose-Capillary: 164 mg/dL — ABNORMAL HIGH (ref 70–99)
Glucose-Capillary: 193 mg/dL — ABNORMAL HIGH (ref 70–99)
Glucose-Capillary: 194 mg/dL — ABNORMAL HIGH (ref 70–99)
Glucose-Capillary: 220 mg/dL — ABNORMAL HIGH (ref 70–99)
Glucose-Capillary: 225 mg/dL — ABNORMAL HIGH (ref 70–99)
Glucose-Capillary: 243 mg/dL — ABNORMAL HIGH (ref 70–99)
Glucose-Capillary: 269 mg/dL — ABNORMAL HIGH (ref 70–99)

## 2019-11-16 LAB — CULTURE, RESPIRATORY W GRAM STAIN

## 2019-11-16 LAB — CULTURE, BLOOD (ROUTINE X 2): Culture: NO GROWTH

## 2019-11-16 LAB — MAGNESIUM: Magnesium: 2.1 mg/dL (ref 1.7–2.4)

## 2019-11-16 IMAGING — DX DG CHEST 1V PORT
1 series · 1 of 1 positions shown · non-contrast
Comparison: Chest radiograph [DATE]

CLINICAL DATA: Oxygen desaturation

EXAM:
PORTABLE CHEST 1 VIEW

[chest]
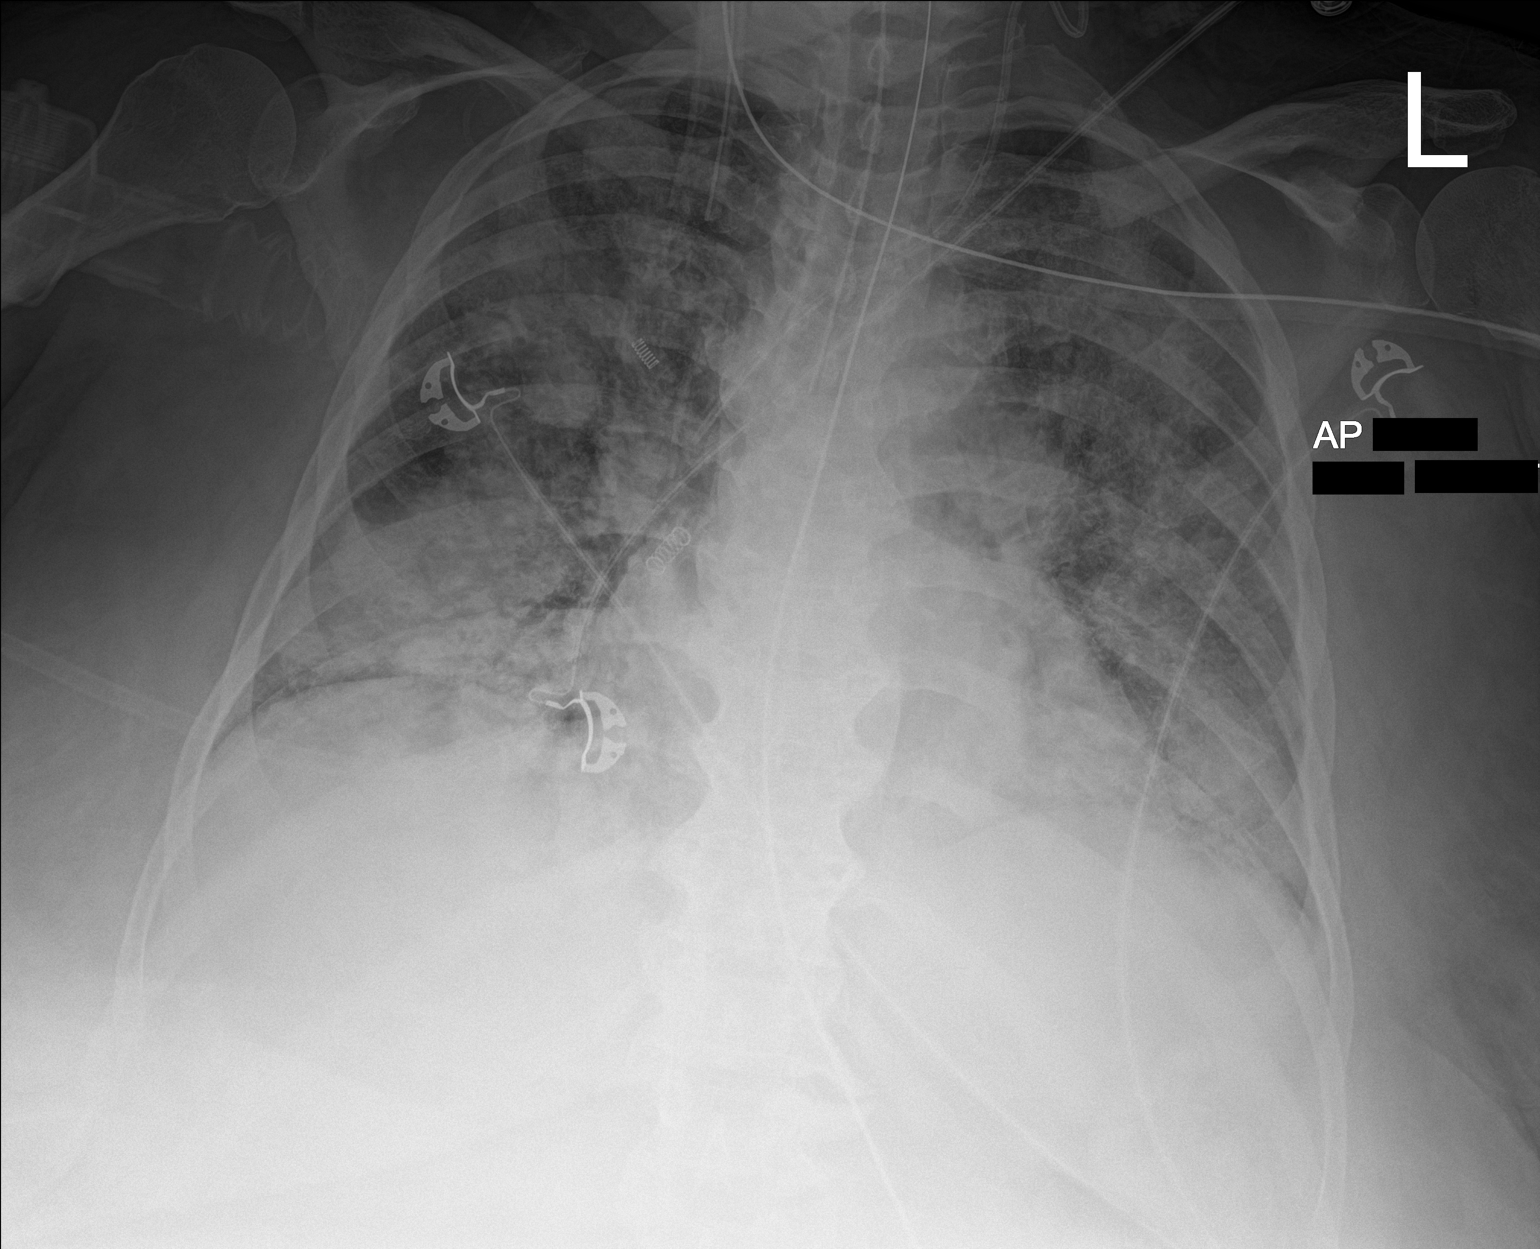

[1 of 1 positions shown; findings below may reference images not displayed]

FINDINGS: Support Apparatus:

--Endotracheal tube: Tip is just above the inferior margin of the
carina. Retraction by 3 cm recommended.

--Enteric tube:Tip and sideport are below the field of view.

--Catheter(s):Left internal jugular vein approach central venous
catheter tip is at the lower SVC

--Other: None

Confluent right greater than left airspace opacities, unchanged.
IMPRESSION: 1. Endotracheal tube tip is just above the inferior margin of the
carina. Retraction by 3 cm recommended.
2. Unchanged right greater than left airspace opacities.

## 2019-11-16 MED ORDER — SENNOSIDES 8.8 MG/5ML PO SYRP
15.0000 mL | ORAL_SOLUTION | Freq: Two times a day (BID) | ORAL | Status: DC
  Administered 2019-11-16 – 2020-01-05 (×86): 15 mL
  Filled 2019-11-16 (×108): qty 15

## 2019-11-16 MED ORDER — FLEET ENEMA 7-19 GM/118ML RE ENEM
1.0000 | ENEMA | Freq: Every day | RECTAL | Status: DC | PRN
  Filled 2019-11-16: qty 1

## 2019-11-16 MED ORDER — FUROSEMIDE 10 MG/ML IJ SOLN
40.0000 mg | Freq: Once | INTRAMUSCULAR | Status: AC
  Administered 2019-11-16: 40 mg via INTRAVENOUS
  Filled 2019-11-16: qty 4

## 2019-11-16 MED ORDER — INSULIN ASPART 100 UNIT/ML ~~LOC~~ SOLN
0.0000 [IU] | SUBCUTANEOUS | Status: DC
  Administered 2019-11-16: 5 [IU] via SUBCUTANEOUS
  Administered 2019-11-16 (×3): 3 [IU] via SUBCUTANEOUS
  Administered 2019-11-17: 2 [IU] via SUBCUTANEOUS
  Administered 2019-11-17 – 2019-11-18 (×4): 3 [IU] via SUBCUTANEOUS
  Administered 2019-11-18: 2 [IU] via SUBCUTANEOUS
  Administered 2019-11-18: 3 [IU] via SUBCUTANEOUS
  Administered 2019-11-18 (×2): 2 [IU] via SUBCUTANEOUS
  Administered 2019-11-19 (×2): 3 [IU] via SUBCUTANEOUS
  Administered 2019-11-19: 16:00:00 5 [IU] via SUBCUTANEOUS
  Administered 2019-11-19: 3 [IU] via SUBCUTANEOUS
  Administered 2019-11-19: 2 [IU] via SUBCUTANEOUS
  Administered 2019-11-19 – 2019-11-20 (×2): 3 [IU] via SUBCUTANEOUS
  Administered 2019-11-20: 8 [IU] via SUBCUTANEOUS
  Administered 2019-11-20: 5 [IU] via SUBCUTANEOUS
  Administered 2019-11-20 – 2019-11-21 (×3): 3 [IU] via SUBCUTANEOUS
  Administered 2019-11-21 (×2): 5 [IU] via SUBCUTANEOUS
  Administered 2019-11-21: 3 [IU] via SUBCUTANEOUS

## 2019-11-16 MED ORDER — INSULIN ASPART 100 UNIT/ML ~~LOC~~ SOLN
1.0000 [IU] | SUBCUTANEOUS | Status: DC
  Administered 2019-11-16 (×2): 3 [IU] via SUBCUTANEOUS

## 2019-11-16 MED ORDER — BISACODYL 10 MG RE SUPP
10.0000 mg | Freq: Every day | RECTAL | Status: DC | PRN
  Administered 2019-11-19: 10 mg via RECTAL
  Filled 2019-11-16: qty 1

## 2019-11-16 MED ORDER — CHLORHEXIDINE GLUCONATE CLOTH 2 % EX PADS
6.0000 | MEDICATED_PAD | Freq: Every day | CUTANEOUS | Status: DC
  Administered 2019-11-17 – 2020-01-08 (×49): 6 via TOPICAL

## 2019-11-16 MED ORDER — INSULIN ASPART 100 UNIT/ML ~~LOC~~ SOLN
3.0000 [IU] | SUBCUTANEOUS | Status: DC
  Administered 2019-11-16 – 2019-11-17 (×5): 3 [IU] via SUBCUTANEOUS

## 2019-11-16 MED ORDER — SODIUM CHLORIDE 0.9 % IV SOLN
3.0000 g | Freq: Four times a day (QID) | INTRAVENOUS | Status: DC
  Administered 2019-11-16 – 2019-11-17 (×5): 3 g via INTRAVENOUS
  Filled 2019-11-16 (×5): qty 8

## 2019-11-16 NOTE — Progress Notes (Signed)
VAST consulted for PIV placement X2. Pt has an order to remove CL. Spoke with pt's nurse; she stated pt has bacteremia and vegetation. CCM is requesting 2 PIV's in place of CL. Advised nurse to leave CL in place until pt's vasculature assessed by IVT. She verbalized understanding.

## 2019-11-16 NOTE — Progress Notes (Signed)
[place] Progress Note Patient Name: Susan [name] DOB: 11-02-1960 MRN: [id number]   Date of Service  11/16/2019  HPI/Events of Note  Hyperglycemia. On tube feeds without SSI.  Also, CXR shows ETT position is too low.   eICU Interventions  Ordered SSI.  Ordered ETT to be retracted by 3 cm.     Intervention Category Intermediate Interventions: Hyperglycemia - evaluation and treatment  Charlott Rakes 11/16/2019, 1:25 AM

## 2019-11-16 NOTE — Progress Notes (Signed)
NAME:  Susan [name], MRN:  [id number], DOB:  [birth year], LOS: 5 ADMISSION DATE:  11/11/2019, CONSULTATION DATE:  3/10 REFERRING MD:  Sabra Heck, CHIEF COMPLAINT:  Acute encephalopathy and respiratory failure    Brief History   59 year old female with history of schizophrenia, HIV, diabetes with diabetic neuropathy and falls at home.  EMS called to the patient's house after what was called out as a fall.  Upon arrival the patient was unresponsive with agonal respiratory pattern.  She was intubated by EMS, hypotensive on arrival to ER.  Admitted with a working diagnosis of acute toxic/metabolic encephalopathy felt possibly secondary to overdose complicated by aspiration pneumonia and septic shock    Past Medical History  Frequent falls at home,Tobacco abuse, diabetes with painful polyneuropathy, bipolar disease, GERD, HIV, schizophrenia, history of IV drug abuse, seizure disorder.   Significant Hospital Events   3/10: EMS summoned to house.  Intubated on arrival, hypotensive on arrival to emergency room  Consults:    Procedures:  Oral endotracheal tube 3/10 Right femoral triple-lumen catheter 3/10 (ER) right internal jugular vein triple-lumen catheter 3/10  Significant Diagnostic Tests:  CT brain 3/10>>> Normal head CT EEG 3/10>>>Moderate to severe diffuse encephalopathy. No seizures Urine drug screen 3/10>>> 3/10 acetaminophen less than 10, salicylate level less than 7 Ethanol level less than 10 11/12/2019 Echo>> EF 20-25%, LV function severely diminished, Global Hypokinesis,LV hypertrophy, Grade 1 Diastolic Dysfunction, Elevated PASP, Dilated LA, MV regurgitation, small mobile density right coronary cusp  Micro Data:  Respiratory culture 3/10>> GS >> Mod G+ cocci, Few G- rods>> Blood culture 3/10 CD4 3/10 Urine Culture 3/10>> No growth  Antimicrobials:  Vancomycin 3/10>>> unasyn 3/10-3/11 unasyn 3/12>>>  Interim history/subjective:  This morning on SBT. Awake alert and  oriented. Passing SBT.  Objective   Blood pressure (Abnormal) 121/105, pulse (Abnormal) 103, temperature 98.5 F (36.9 C), temperature source Axillary, resp. rate (Abnormal) 40, height 5\' 3"  (1.6 m), weight 108.3 kg, SpO2 91 %.    Vent Mode: PSV;CPAP FiO2 (%):  [40 %-50 %] 50 % Set Rate:  [22 bmp] 22 bmp PEEP:  [5 cmH20-8 cmH20] 5 cmH20 Pressure Support:  [5 cmH20] 5 cmH20 Plateau Pressure:  [25 cmH20-32 cmH20] 27 cmH20   Intake/Output Summary (Last 24 hours) at 11/16/2019 0940 Last data filed at 11/16/2019 0900 Gross per 24 hour  Intake 1711.28 ml  Output no documentation  Net 1711.28 ml   Filed Weights   11/14/19 0500 11/15/19 0435 11/16/19 0452  Weight: 109.4 kg 107.2 kg 108.3 kg    Examination: General this is a 59 year old black female she appears comfortable on precedex but does show some accessory use on current PSV support HENT NCAT no JVD left CVL clean and intact, orally intubated Pulm mild nasal flare w/ RR high 30s on PSV of 5. Scattered and diffuse rhonchi Card tachy RRR abd not tender, no BM + bowel sounds Neuro awake, follows commands no focal def gu cl yellow Ext warm and dry   Resolved Hospital Problem list    Septic shock  Rhabdomyolysis Acute metabolic encephalopathy Elevated INR  Assessment & Plan:   ARDS/acute hypoxic and hypercarbic respiratory failure w/ staph PNA Current every day smoker PCXR w/ diffuse bilateral patchy infiltrates.  ETT a little low Plan Cont full vent support w/ PSV as tolerated. She does not appear ready for extubation yet IV lasix VAP bundle PAD protocol  Day 6 vanc/unasyn-->awaiting staph sensitivities     Acute systolic heart failure Echo with EF  20-25% - potentially septic cardiomyopathy and Pneumococcal bacteremia w/  AV endocarditis  Plan Cont day 6 vanc and unasyn  Ask cards to do TEE per recs of ID Cont norvasc Dc CVL  Acute toxic and metabolic encephalopathy.  She has a history of schizophrenia & seizure  disorder.home medications Abilify, Cogentin, doxepin, Neurontin and BuSpar all on her medicine profile.  -EEG - no seizure activity -Serum ethanol, tylenol and salicylate unremarkable -UDS + cannabinol Plan Cont home AEDs Cont precedex  Stop seroquel per her out-pt psych recs  Acute renal failure: Suspect 2/2 shock-->slowly improving  Plan Renal adjust meds Strict I&O Am chemistry Avoid hypotension   HIV - well controlled Plan Cont home meds  Constipation Plan  LOC  DM w/ hyperglycemia  Plan SSI  Add novolog TF coverage 3 units q 4  Best practice:  Diet: tube feeds. Will need swallow eval if extubated Pain/Anxiety/Delirium protocol (if indicated): precedex VAP protocol (if indicated): ordered DVT prophylaxis: [facility] GI prophylaxis: PPI Glucose control: ssi Mobility: BR Code Status: full code  Family Communication: daughter udpated by phone 3/12. Patient's visitor is listed as sister but is actually just a friend. Daughter is DPOA. Disposition: to ICU   My cct 34 min  Erick Colace ACNP-BC [place] Pager # [phone] OR # [phone] if no answer

## 2019-11-16 NOTE — Progress Notes (Signed)
[place] Progress Note Patient Name: Susan [name] DOB: [birth year] MRN: [id number]   Date of Service  11/16/2019  HPI/Events of Note  ABG result (while breathing at 35-40 / min on ventilator):  ABG    Component Value Date/Time   PHART 7.444 11/15/2019 2042   PCO2ART 38.6 11/15/2019 2042   PO2ART 55.0 (L) 11/15/2019 2042   HCO3 26.4 11/15/2019 2042   TCO2 28 11/15/2019 2042   ACIDBASEDEF 4.0 (H) 11/12/2019 1504   O2SAT 90.0 11/15/2019 2042   Sats 90-91% on PEEP 5, FiO2 40%.   eICU Interventions  Increase PEEP from 5 to 8 for hypoxemia.  Get CXR now (asked RN to call now for the one that is already ordered for the morning). Her tachypnea is, I suspect, a result of significant/worsening dead space ventilation from her multifocal pneumonia.     Intervention Category Major Interventions: Respiratory failure - evaluation and management  [name] 11/16/2019, 12:03 AM

## 2019-11-16 NOTE — Progress Notes (Signed)
[facility]ious Disease   Reason for visit: Follow up on sepsis  Interval History: remains intubated in the ICU.  WBC 12, afebrile. Repeat blood cultures remain ngtd.  TTE with aortic valve vegetation.      Physical Exam: Constitutional:  Vitals:   11/16/19 0900 11/16/19 0915  BP: 116/63 (!) 121/105  Pulse: (!) 105 (!) 103  Resp: (!) 33 (!) 40  Temp:    SpO2: 90% 91%   patient in nad Eyes: anicteric HENT: +ET Respiratory: Normal respiratory effort; CTA B Cardiovascular: RRR GI: soft, nt, nd  Review of Systems: Unable to be assessed due to patient factors  Lab Results  Component Value Date   WBC 12.0 (H) 11/16/2019   HGB 10.9 (L) 11/16/2019   HCT 33.2 (L) 11/16/2019   MCV 84.5 11/16/2019   PLT 89 (L) 11/16/2019    Lab Results  Component Value Date   CREATININE 2.17 (H) 11/16/2019   BUN 34 (H) 11/16/2019   NA 145 11/16/2019   K 4.5 11/16/2019   CL 108 11/16/2019   CO2 26 11/16/2019    Lab Results  Component Value Date   ALT 18 11/14/2019   AST 42 (H) 11/14/2019   ALKPHOS 103 11/14/2019     Microbiology: Recent Results (from the past 240 hour(s))  Culture, blood (Routine x 2)     Status: Abnormal   Collection Time: 11/11/19  2:51 PM   Specimen: BLOOD  Result Value Ref Range Status   Specimen Description BLOOD CENTRAL LINE  Final   Special Requests   Final    BOTTLES DRAWN AEROBIC AND ANAEROBIC Blood Culture adequate volume   Culture  Setup Time   Final    GRAM POSITIVE COCCI IN CHAINS AEROBIC BOTTLE ONLY CRITICAL RESULT CALLED TO, READ BACK BY AND VERIFIED WITH: Salli Real [id number] Performed at [facility], [address], [place], [place] [address]    Culture STREPTOCOCCUS PNEUMONIAE (A)  Final   Report Status 11/14/2019 FINAL  Final   Organism ID, Bacteria STREPTOCOCCUS PNEUMONIAE  Final      Susceptibility   Streptococcus pneumoniae - MIC*    ERYTHROMYCIN <=0.12 SENSITIVE Sensitive     LEVOFLOXACIN 0.5  SENSITIVE Sensitive     VANCOMYCIN <=0.12 SENSITIVE Sensitive     PENICILLIN (meningitis) <=0.06 SENSITIVE Sensitive     PENO - penicillin <=0.06      PENICILLIN (non-meningitis) <=0.06 SENSITIVE Sensitive     PENICILLIN (oral) <=0.06 SENSITIVE Sensitive     CEFTRIAXONE (non-meningitis) <=0.12 SENSITIVE Sensitive     CEFTRIAXONE (meningitis) <=0.12 SENSITIVE Sensitive     * STREPTOCOCCUS PNEUMONIAE  Culture, blood (Routine x 2)     Status: None   Collection Time: 11/11/19  2:56 PM   Specimen: BLOOD RIGHT HAND  Result Value Ref Range Status   Specimen Description BLOOD RIGHT HAND  Final   Special Requests   Final    BOTTLES DRAWN AEROBIC ONLY Blood Culture results may not be optimal due to an inadequate volume of blood received in culture bottles   Culture NO GROWTH 5 DAYS  Final   Report Status 11/16/2019 FINAL  Final  Urine culture     Status: None   Collection Time: 11/11/19  3:11 PM   Specimen: Urine, Catheterized  Result Value Ref Range Status   Specimen Description URINE, CATHETERIZED  Final   Special Requests NONE  Final   Culture   Final    NO  GROWTH Performed at [facility], [place] [address] [place], [place] [address]    Report Status 11/12/2019 FINAL  Final  Respiratory Panel by RT PCR (Flu A&B, Covid) - Nasopharyngeal Swab     Status: None   Collection Time: 11/11/19  3:13 PM   Specimen: Nasopharyngeal Swab  Result Value Ref Range Status   SARS Coronavirus 2 by RT PCR NEGATIVE NEGATIVE Final    Comment: (NOTE) SARS-CoV-2 target nucleic acids are NOT DETECTED. The SARS-CoV-2 RNA is generally detectable in upper respiratoy specimens during the acute phase of infection. The lowest concentration of SARS-CoV-2 viral copies this assay can detect is 131 copies/mL. A negative result does not preclude SARS-Cov-2 infection and should not be used as the sole basis for treatment or other patient management decisions. A negative result may occur with  improper specimen  collection/handling, submission of specimen other than nasopharyngeal swab, presence of viral mutation(s) within the areas targeted by this assay, and inadequate number of viral copies (<131 copies/mL). A negative result must be combined with clinical observations, patient history, and epidemiological information. The expected result is Negative. Fact Sheet for Patients:  PinkCheek.be Fact Sheet for Healthcare Providers:  GravelBags.it This test is not yet ap proved or cleared by the [place] FDA and  has been authorized for detection and/or diagnosis of SARS-CoV-2 by FDA under an Emergency Use Authorization (EUA). This EUA will remain  in effect (meaning this test can be used) for the duration of the COVID-19 declaration under Section 564(b)(1) of the Act, 21 U.S.C. section 360bbb-3(b)(1), unless the authorization is terminated or revoked sooner.    Influenza A by PCR NEGATIVE NEGATIVE Final   Influenza B by PCR NEGATIVE NEGATIVE Final    Comment: (NOTE) The Xpert Xpress SARS-CoV-2/FLU/RSV assay is intended as an aid in  the diagnosis of influenza from Nasopharyngeal swab specimens and  should not be used as a sole basis for treatment. Nasal washings and  aspirates are unacceptable for Xpert Xpress SARS-CoV-2/FLU/RSV  testing. Fact Sheet for Patients: PinkCheek.be Fact Sheet for Healthcare Providers: GravelBags.it This test is not yet approved or cleared by the [place] FDA and  has been authorized for detection and/or diagnosis of SARS-CoV-2 by  FDA under an Emergency Use Authorization (EUA). This EUA will remain  in effect (meaning this test can be used) for the duration of the  Covid-19 declaration under Section 564(b)(1) of the Act, 21  U.S.C. section 360bbb-3(b)(1), unless the authorization is  terminated or revoked. Performed at [facility], [place] [address] [place], [place] [address]   MRSA PCR Screening     Status: Abnormal   Collection Time: 11/11/19  8:23 PM   Specimen: Nasal Mucosa; Nasopharyngeal  Result Value Ref Range Status   MRSA by PCR POSITIVE (A) NEGATIVE Final    Comment: CRITICAL RESULT CALLED TO, READ BACK BY AND VERIFIED WITH: RN Cindee Salt [id number] @2219  THANEY Performed at [facility] [address] [place], [place] [address]   Culture, respiratory (non-expectorated)     Status: None (Preliminary result)   Collection Time: 11/12/19  6:31 AM   Specimen: Tracheal Aspirate; Respiratory  Result Value Ref Range Status   Specimen Description TRACHEAL ASPIRATE  Final   Special Requests NONE  Final   Gram Stain   Final    FEW WBC PRESENT, PREDOMINANTLY PMN MODERATE GRAM POSITIVE COCCI FEW GRAM NEGATIVE RODS    Culture   Final    RARE STAPHYLOCOCCUS  AUREUS RARE CANDIDA ALBICANS SUSCEPTIBILITIES TO FOLLOW Performed at [facility], [place] [address] [place], [place] [address]    Report Status PENDING  Incomplete  Culture, blood (routine x 2)     Status: None (Preliminary result)   Collection Time: 11/13/19  6:21 PM   Specimen: BLOOD LEFT HAND  Result Value Ref Range Status   Specimen Description BLOOD LEFT HAND  Final   Special Requests   Final    BOTTLES DRAWN AEROBIC ONLY Blood Culture adequate volume Performed at [facility], [place] [address], [place], [place] [address]    Culture NO GROWTH 3 DAYS  Final   Report Status PENDING  Incomplete  Culture, blood (routine x 2)     Status: None (Preliminary result)   Collection Time: 11/13/19  6:31 PM   Specimen: BLOOD LEFT HAND  Result Value Ref Range Status   Specimen Description BLOOD LEFT HAND  Final   Special Requests   Final    BOTTLES DRAWN AEROBIC ONLY Blood Culture adequate volume Performed at [facility], [place] [address], [place], [place] [address]    Culture NO GROWTH 3 DAYS  Final   Report Status PENDING  Incomplete     Impression/Plan:  1. Strep pneumonia bacteremia - repeat cultures ngtd.  CXR c/w pneumonia and on ampicillin/sulbactam.   Continue with antibiotics.    2.  Aortic valve vegetation - Noted on TTE.  Strep pneumonia not typical for endocarditis but Staph aureus in sputum.   I recommend a TEE to verify when able.    3.  Sputum culture - Staph noted and on vancomycin.  Waiting for sensitivities and as in #2. Strep pneumonia not typically a cause of endocarditis so querry if the Staph aureus found in the sputum is related.   TEE recommendation as above.

## 2019-11-16 NOTE — Progress Notes (Signed)
Assisted tele visit to patient with family member.  Phoenix Dresser M, RN  

## 2019-11-16 NOTE — Progress Notes (Signed)
No suitable vein for PIV Assigned nurse made aware. Assessed by 2 IV nurses with ultrasound on both arms.

## 2019-11-16 NOTE — Progress Notes (Addendum)
Pharmacy Antibiotic Note  Susan [name] is a 59 y.o. female admitted on 11/11/2019 with AMS and respiratory failure.  Pharmacy has been consulted for vancomycin and Unasyn dosing for strep bacteremia.  Patient's Scr has decreased (2.17 mg/dL) and creatinine clearance improved (~34 mL/min). Unasyn dose warrants adjustment given improvement in renal function.  Plan: Increase Unasyn to 3 g IV q6h.  -Monitor Scr and CrCl closely, adjust as needed.  Continue vancomycin 1000 mg IV q48h -Will follow up need for levels and length of therapy.  Height: 5\' 3"  (160 cm) Weight: 238 lb 12.1 oz (108.3 kg) IBW/kg (Calculated) : 52.4  Temp (24hrs), Avg:98.9 F (37.2 C), Min:98.5 F (36.9 C), Max:99.1 F (37.3 C)  Recent Labs  Lab 11/11/19 1453 11/11/19 1453 11/11/19 1501 11/11/19 1738 11/11/19 2029 11/11/19 2323 11/11/19 2332 11/12/19 0441 11/12/19 0830 11/13/19 0044 11/13/19 0402 11/13/19 0820 11/14/19 0411 11/15/19 0425 11/15/19 0951 11/16/19 0442  WBC 4.7   < >  --   --   --   --   --  4.3  --   --  8.5  --  5.4 7.5  --  12.0*  CREATININE 2.46*  --    < > 2.05*  --    < >  --   --    < > 2.50*  --  2.50* 2.47*  --  2.22* 2.17*  LATICACIDVEN >11.0*  --   --  5.4* 3.9*  --  2.8*  --   --   --   --   --  1.2  --   --   --    < > = values in this interval not displayed.    Estimated Creatinine Clearance: 33.8 mL/min (A) (by C-G formula based on SCr of 2.17 mg/dL (H)).    Allergies  Allergen Reactions  . Lyrica [Pregabalin] Swelling    Has LE swelling    Antimicrobials this admission: Vanc 3/10 >> Unasyn 3/10 >> 3/11; 3/12 >> Zosyn x1 3/10, 3/11 >>3/12  Dose adjustments this admission: Unasyn 3 g IV q12h >> 3 g IV q6h   Microbiology results: 3/10 covid / flu - negative 3/10 MRSA PCR - positive 3/10BCx - 1/4 Strep pneumo 3/10UCx - negative 3/11 TA - MRSA  Thank you for allowing pharmacy to be a part of this patient's care.  Claudina Lick, PharmD Candidate   11/16/2019 11:34 AM

## 2019-11-16 NOTE — Progress Notes (Signed)
Inpatient Diabetes Program Recommendations  AACE/ADA: New Consensus Statement on Inpatient Glycemic Control (2015)  Target Ranges:  Prepandial:   less than 140 mg/dL      Peak postprandial:   less than 180 mg/dL (1-2 hours)      Critically ill patients:  140 - 180 mg/dL   Lab Results  Component Value Date   GLUCAP 220 (H) 11/16/2019   HGBA1C 12.3 (H) 11/11/2019    Review of Glycemic Control  Results for Susan [name], Susan [name] (MRN [id number]) as of 11/16/2019 08:51  Ref. Range 11/15/2019 15:57 11/15/2019 20:07 11/16/2019 00:28 11/16/2019 03:55 11/16/2019 07:54  Glucose-Capillary Latest Ref Range: 70 - 99 mg/dL 220 (H) 185 (H) 243 (H) 269 (H) 220 (H)    Diabetes history: DM2 Outpatient Diabetes medications: NPH 130 units QD  Current orders for Inpatient glycemic control: Novolog 0-3 Q4H + vital @ 98ml/hr  Inpatient Diabetes Program Recommendations:  Insulin - Meal Coverage: Tube feed coverage, Novolog 3 units Q4H.  Stop if tube feeds held or discontinued   Thank you, Reche Dixon, RN, BSN Diabetes Coordinator Inpatient Diabetes Program [phone] (team pager from 8a-5p)

## 2019-11-17 ENCOUNTER — Inpatient Hospital Stay (HOSPITAL_COMMUNITY): Payer: Medicare Other

## 2019-11-17 ENCOUNTER — Other Ambulatory Visit (HOSPITAL_COMMUNITY): Payer: Medicare Other

## 2019-11-17 ENCOUNTER — Inpatient Hospital Stay: Payer: Self-pay

## 2019-11-17 DIAGNOSIS — I700 Atherosclerosis of aorta: Secondary | ICD-10-CM

## 2019-11-17 DIAGNOSIS — J9601 Acute respiratory failure with hypoxia: Secondary | ICD-10-CM | POA: Diagnosis not present

## 2019-11-17 DIAGNOSIS — E870 Hyperosmolality and hypernatremia: Secondary | ICD-10-CM

## 2019-11-17 DIAGNOSIS — J15212 Pneumonia due to Methicillin resistant Staphylococcus aureus: Secondary | ICD-10-CM

## 2019-11-17 DIAGNOSIS — G9341 Metabolic encephalopathy: Secondary | ICD-10-CM

## 2019-11-17 DIAGNOSIS — B953 Streptococcus pneumoniae as the cause of diseases classified elsewhere: Secondary | ICD-10-CM | POA: Diagnosis not present

## 2019-11-17 DIAGNOSIS — I330 Acute and subacute infective endocarditis: Secondary | ICD-10-CM | POA: Diagnosis not present

## 2019-11-17 DIAGNOSIS — R7881 Bacteremia: Secondary | ICD-10-CM | POA: Diagnosis not present

## 2019-11-17 DIAGNOSIS — N170 Acute kidney failure with tubular necrosis: Secondary | ICD-10-CM

## 2019-11-17 DIAGNOSIS — D72829 Elevated white blood cell count, unspecified: Secondary | ICD-10-CM

## 2019-11-17 DIAGNOSIS — J13 Pneumonia due to Streptococcus pneumoniae: Secondary | ICD-10-CM | POA: Diagnosis not present

## 2019-11-17 LAB — BASIC METABOLIC PANEL
Anion gap: 14 (ref 5–15)
BUN: 35 mg/dL — ABNORMAL HIGH (ref 6–20)
CO2: 27 mmol/L (ref 22–32)
Calcium: 8.2 mg/dL — ABNORMAL LOW (ref 8.9–10.3)
Chloride: 109 mmol/L (ref 98–111)
Creatinine, Ser: 2.26 mg/dL — ABNORMAL HIGH (ref 0.44–1.00)
GFR calc Af Amer: 27 mL/min — ABNORMAL LOW (ref >60)
GFR calc non Af Amer: 23 mL/min — ABNORMAL LOW (ref >60)
Glucose, Bld: 207 mg/dL — ABNORMAL HIGH (ref 70–99)
Potassium: 3.2 mmol/L — ABNORMAL LOW (ref 3.5–5.1)
Sodium: 150 mmol/L — ABNORMAL HIGH (ref 135–145)

## 2019-11-17 LAB — CBC
HCT: 36 % (ref 36.0–46.0)
Hemoglobin: 11.7 g/dL — ABNORMAL LOW (ref 12.0–15.0)
MCH: 27.5 pg (ref 26.0–34.0)
MCHC: 32.5 g/dL (ref 30.0–36.0)
MCV: 84.7 fL (ref 80.0–100.0)
Platelets: 107 10*3/uL — ABNORMAL LOW (ref 150–400)
RBC: 4.25 MIL/uL (ref 3.87–5.11)
RDW: 15.3 % (ref 11.5–15.5)
WBC: 13.8 10*3/uL — ABNORMAL HIGH (ref 4.0–10.5)
nRBC: 0.3 % — ABNORMAL HIGH (ref 0.0–0.2)

## 2019-11-17 LAB — POCT I-STAT 7, (LYTES, BLD GAS, ICA,H+H)
Acid-Base Excess: 4 mmol/L — ABNORMAL HIGH (ref 0.0–2.0)
Acid-Base Excess: 4 mmol/L — ABNORMAL HIGH (ref 0.0–2.0)
Bicarbonate: 29.5 mmol/L — ABNORMAL HIGH (ref 20.0–28.0)
Bicarbonate: 31.5 mmol/L — ABNORMAL HIGH (ref 20.0–28.0)
Calcium, Ion: 1.17 mmol/L (ref 1.15–1.40)
Calcium, Ion: 1.17 mmol/L (ref 1.15–1.40)
HCT: 35 % — ABNORMAL LOW (ref 36.0–46.0)
HCT: 33 % — ABNORMAL LOW (ref 36.0–46.0)
Hemoglobin: 11.9 g/dL — ABNORMAL LOW (ref 12.0–15.0)
Hemoglobin: 11.2 g/dL — ABNORMAL LOW (ref 12.0–15.0)
O2 Saturation: 84 %
O2 Saturation: 94 %
Patient temperature: 98.9
Patient temperature: 99.4
Potassium: 3.1 mmol/L — ABNORMAL LOW (ref 3.5–5.1)
Potassium: 3.5 mmol/L (ref 3.5–5.1)
Sodium: 150 mmol/L — ABNORMAL HIGH (ref 135–145)
Sodium: 151 mmol/L — ABNORMAL HIGH (ref 135–145)
TCO2: 31 mmol/L (ref 22–32)
TCO2: 33 mmol/L — ABNORMAL HIGH (ref 22–32)
pCO2 arterial: 46.1 mmHg (ref 32.0–48.0)
pCO2 arterial: 64.8 mmHg — ABNORMAL HIGH (ref 32.0–48.0)
pH, Arterial: 7.415 (ref 7.350–7.450)
pH, Arterial: 7.297 — ABNORMAL LOW (ref 7.350–7.450)
pO2, Arterial: 49 mmHg — ABNORMAL LOW (ref 83.0–108.0)
pO2, Arterial: 81 mmHg — ABNORMAL LOW (ref 83.0–108.0)

## 2019-11-17 LAB — GLUCOSE, CAPILLARY
Glucose-Capillary: 192 mg/dL — ABNORMAL HIGH (ref 70–99)
Glucose-Capillary: 177 mg/dL — ABNORMAL HIGH (ref 70–99)
Glucose-Capillary: 150 mg/dL — ABNORMAL HIGH (ref 70–99)
Glucose-Capillary: 91 mg/dL (ref 70–99)
Glucose-Capillary: 109 mg/dL — ABNORMAL HIGH (ref 70–99)
Glucose-Capillary: 68 mg/dL — ABNORMAL LOW (ref 70–99)

## 2019-11-17 LAB — COOXEMETRY PANEL
Carboxyhemoglobin: 1.7 % — ABNORMAL HIGH (ref 0.5–1.5)
Methemoglobin: 1.3 % (ref 0.0–1.5)
O2 Saturation: 87.7 %
Total hemoglobin: 9 g/dL — ABNORMAL LOW (ref 12.0–16.0)

## 2019-11-17 LAB — T-HELPER CELLS (CD4) COUNT (NOT AT ARMC)
CD4 % Helper T Cell: 28 % — ABNORMAL LOW (ref 33–65)
CD4 T Cell Abs: 471 /uL (ref 400–1790)

## 2019-11-17 LAB — PROTIME-INR
INR: 1.2 (ref 0.8–1.2)
Prothrombin Time: 14.8 seconds (ref 11.4–15.2)

## 2019-11-17 LAB — BRAIN NATRIURETIC PEPTIDE: B Natriuretic Peptide: 772.7 pg/mL — ABNORMAL HIGH (ref 0.0–100.0)

## 2019-11-17 IMAGING — DX DG CHEST 1V PORT
1 series · 1 of 1 positions shown · non-contrast
Comparison: [DATE]

CLINICAL DATA: Acute respiratory failure

EXAM:
PORTABLE CHEST 1 VIEW

[chest]
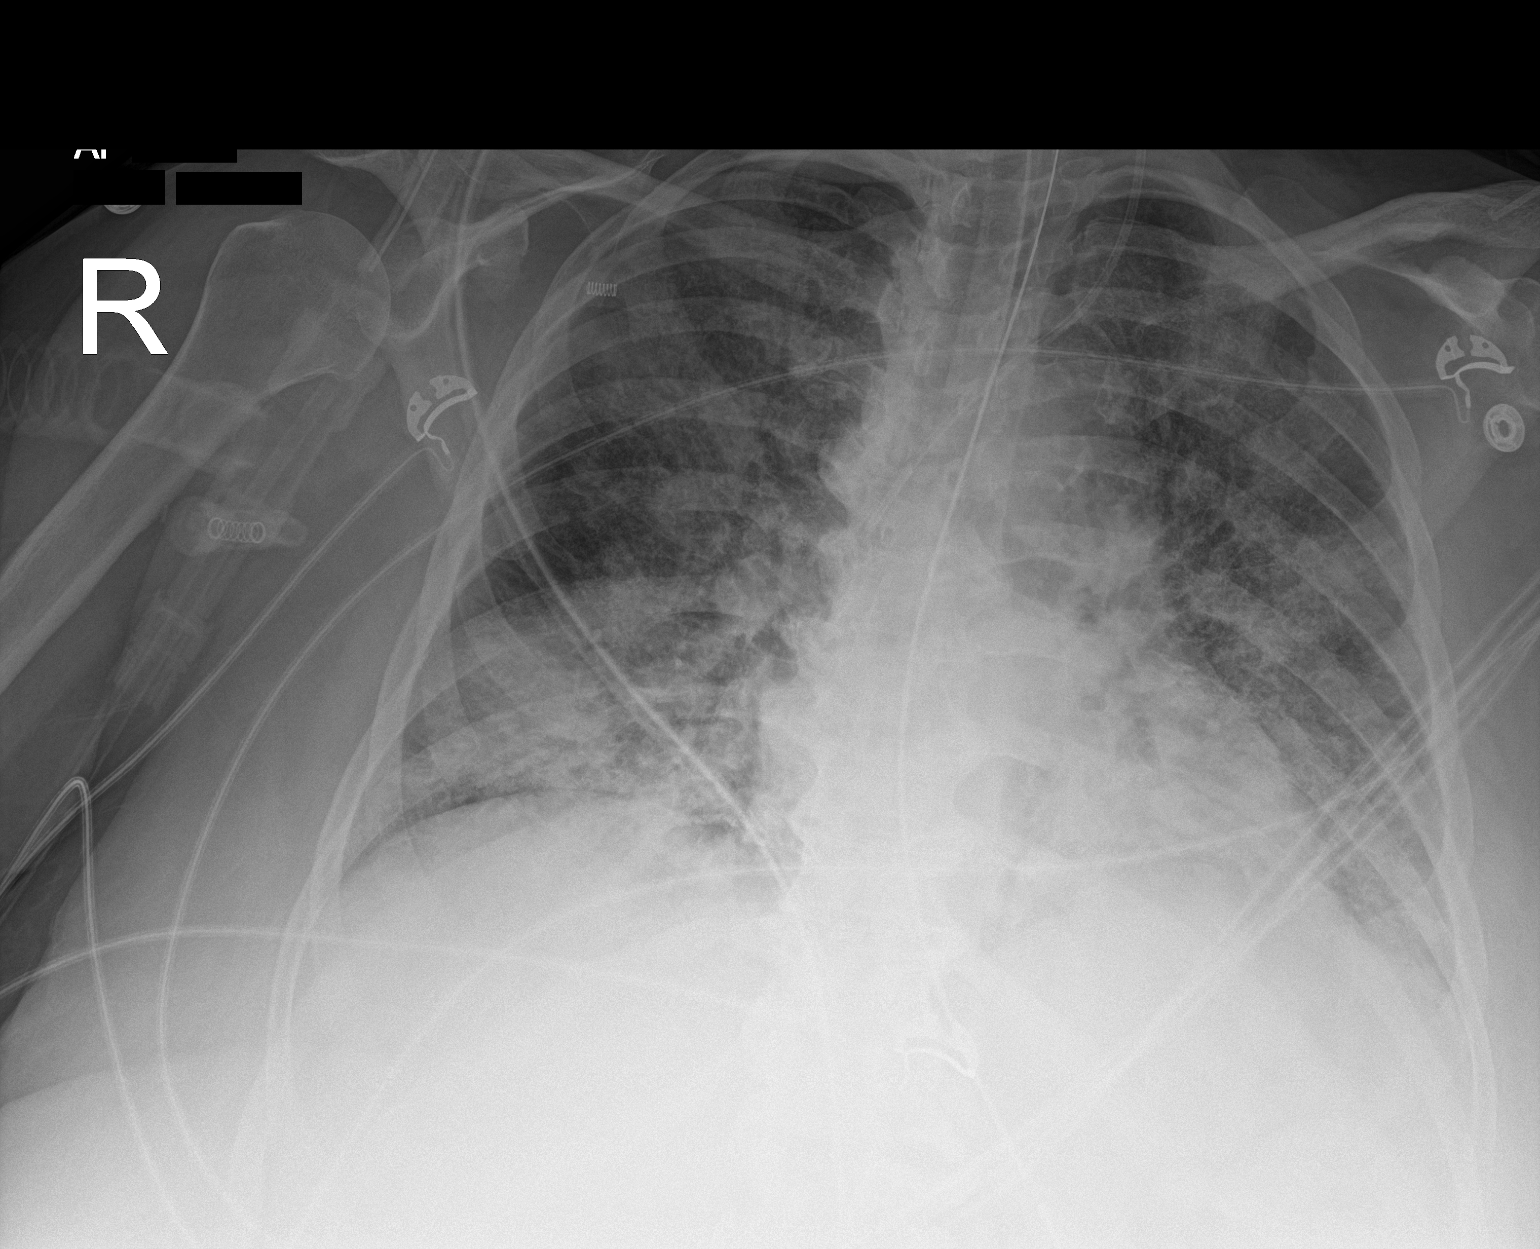

[1 of 1 positions shown; findings below may reference images not displayed]

FINDINGS: Support devices are stable. Diffuse bilateral airspace disease again
noted, unchanged. Heart is normal size. No effusions or
pneumothorax.
IMPRESSION: Support devices stable.

Diffuse bilateral airspace disease, unchanged.

## 2019-11-17 IMAGING — DX DG ABDOMEN 1V
1 series · 1 of 1 positions shown · non-contrast
Comparison: None.

CLINICAL DATA: NG tube placement

EXAM:
ABDOMEN - 1 VIEW

[abdomen]
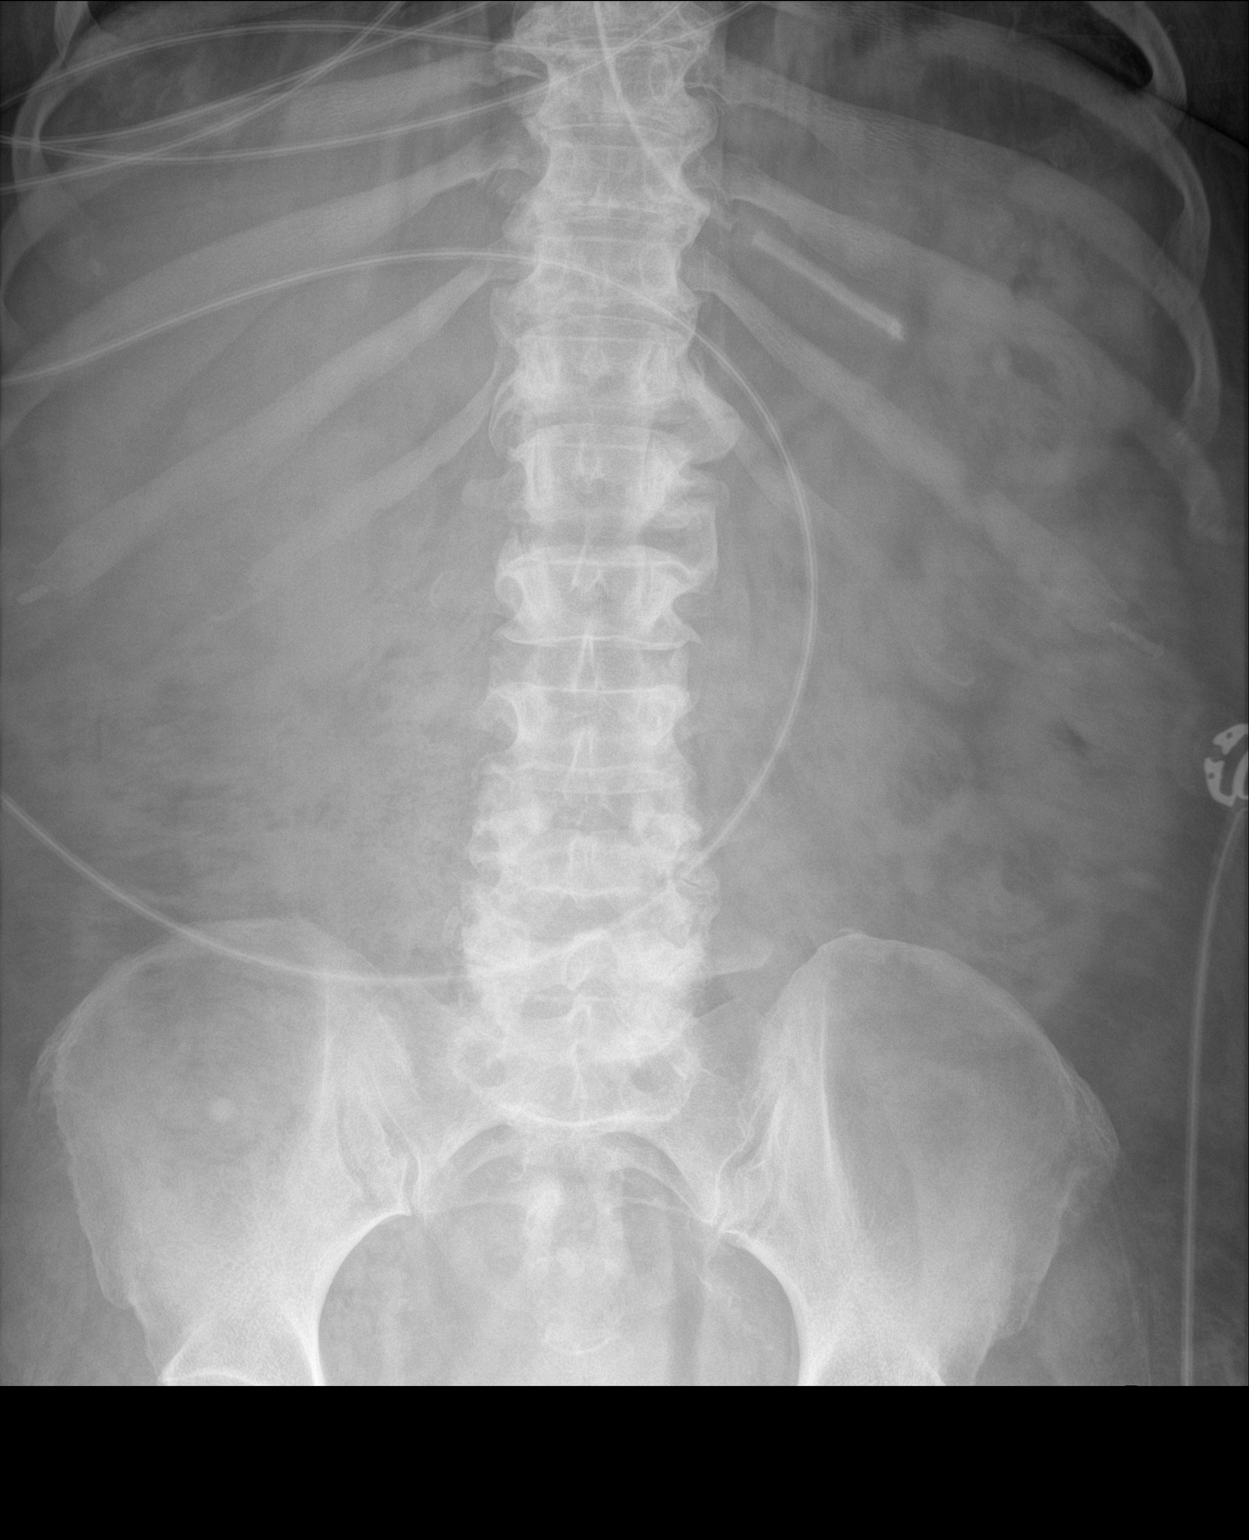

[1 of 1 positions shown; findings below may reference images not displayed]

FINDINGS: The enteric tube projects over the expected region of the gastric
body. The bowel gas pattern is nonspecific with a relative paucity
of bowel gas scattered throughout the abdomen. There are
calcifications that project over the patient's pelvis and are
favored to represent phleboliths.
IMPRESSION: Enteric tube projects over the expected region of the gastric body.

## 2019-11-17 MED ORDER — CISATRACURIUM BESYLATE 20 MG/10ML IV SOLN
10.0000 mg | Freq: Once | INTRAVENOUS | Status: AC
  Administered 2019-11-17: 10 mg via INTRAVENOUS
  Filled 2019-11-17: qty 10

## 2019-11-17 MED ORDER — MIDAZOLAM HCL 2 MG/2ML IJ SOLN
2.0000 mg | Freq: Once | INTRAMUSCULAR | Status: AC
  Administered 2019-11-17: 2 mg via INTRAVENOUS
  Filled 2019-11-17: qty 2

## 2019-11-17 MED ORDER — FREE WATER
200.0000 mL | Freq: Four times a day (QID) | Status: DC
  Administered 2019-11-17 – 2019-11-18 (×3): 200 mL

## 2019-11-17 MED ORDER — CISATRACURIUM BOLUS VIA INFUSION: 10.0000 mg | Freq: Once | INTRAVENOUS | Status: DC

## 2019-11-17 MED ORDER — FUROSEMIDE 10 MG/ML IJ SOLN
8.0000 mg/h | INTRAVENOUS | Status: DC
  Administered 2019-11-17 (×2): 4 mg/h via INTRAVENOUS
  Administered 2019-11-19: 8 mg/h via INTRAVENOUS
  Filled 2019-11-17 (×2): qty 21
  Filled 2019-11-17 (×2): qty 25

## 2019-11-17 MED ORDER — DEXTROSE 50 % IV SOLN: 12.5000 g | INTRAVENOUS | Status: AC

## 2019-11-17 MED ORDER — ALTEPLASE 2 MG IJ SOLR
2.0000 mg | Freq: Once | INTRAMUSCULAR | Status: AC
  Administered 2019-11-17: 2 mg
  Filled 2019-11-17: qty 2

## 2019-11-17 MED ORDER — MIDAZOLAM HCL 2 MG/2ML IJ SOLN
2.0000 mg | Freq: Once | INTRAMUSCULAR | Status: AC
  Administered 2019-11-17: 06:00:00 2 mg via INTRAVENOUS
  Filled 2019-11-17: qty 2

## 2019-11-17 MED ORDER — INSULIN ASPART 100 UNIT/ML ~~LOC~~ SOLN: 6.0000 [IU] | SUBCUTANEOUS | Status: DC

## 2019-11-17 MED ORDER — POTASSIUM CHLORIDE CRYS ER 20 MEQ PO TBCR
30.0000 meq | EXTENDED_RELEASE_TABLET | ORAL | Status: AC
  Administered 2019-11-17 (×2): 30 meq via ORAL
  Filled 2019-11-17 (×2): qty 1

## 2019-11-17 MED ORDER — PENICILLIN G POTASSIUM 20000000 UNITS IJ SOLR
9.0000 10*6.[IU] | Freq: Two times a day (BID) | INTRAVENOUS | Status: AC
  Administered 2019-11-17 – 2019-11-24 (×15): 9 10*6.[IU] via INTRAVENOUS
  Filled 2019-11-17 (×15): qty 9

## 2019-11-17 MED ORDER — SODIUM CHLORIDE 0.9% FLUSH
10.0000 mL | INTRAVENOUS | Status: DC | PRN
  Administered 2019-12-16 – 2019-12-27 (×4): 10 mL
  Administered 2019-12-28: 20 mL
  Administered 2019-12-29: 10 mL

## 2019-11-17 MED ORDER — CISATRACURIUM BESYLATE 20 MG/10ML IV SOLN
10.0000 mg | Freq: Once | INTRAVENOUS | Status: DC
  Filled 2019-11-17: qty 10

## 2019-11-17 MED ORDER — FENTANYL 2500MCG IN NS 250ML (10MCG/ML) PREMIX INFUSION
0.0000 ug/h | INTRAVENOUS | Status: DC
  Administered 2019-11-17: 50 ug/h via INTRAVENOUS
  Administered 2019-11-17: 275 ug/h via INTRAVENOUS
  Administered 2019-11-17: 350 ug/h via INTRAVENOUS
  Administered 2019-11-18 – 2019-11-19 (×4): 200 ug/h via INTRAVENOUS
  Administered 2019-11-21: 50 ug/h via INTRAVENOUS
  Filled 2019-11-17 (×8): qty 250

## 2019-11-17 MED ORDER — INSULIN ASPART 100 UNIT/ML ~~LOC~~ SOLN
6.0000 [IU] | SUBCUTANEOUS | Status: DC
  Administered 2019-11-17 – 2019-11-19 (×10): 6 [IU] via SUBCUTANEOUS

## 2019-11-17 MED ORDER — FREE WATER: 300.0000 mL | Status: DC

## 2019-11-17 MED ORDER — SODIUM CHLORIDE 0.9 % IV SOLN
0.2500 mg/kg/h | INTRAVENOUS | Status: DC
  Filled 2019-11-17: qty 5

## 2019-11-17 MED ORDER — POTASSIUM CHLORIDE 20 MEQ/15ML (10%) PO SOLN
40.0000 meq | Freq: Once | ORAL | Status: AC
  Administered 2019-11-17: 40 meq
  Filled 2019-11-17: qty 30

## 2019-11-17 MED ORDER — SODIUM CHLORIDE 0.9% FLUSH
10.0000 mL | Freq: Two times a day (BID) | INTRAVENOUS | Status: DC
Start: 2019-11-17 — End: 2019-11-18
  Administered 2019-11-17 – 2019-11-18 (×2): 10 mL

## 2019-11-17 MED ORDER — DEXTROSE 50 % IV SOLN
INTRAVENOUS | Status: AC
  Administered 2019-11-17: 12.5 g via INTRAVENOUS
  Filled 2019-11-17: qty 50

## 2019-11-17 NOTE — Progress Notes (Signed)
Hypoglycemic Event  CBG:cbg-68  Treatment: D50 25 mL (12.5 gm)  Symptoms: None  Follow-up CBG: Time:2057 CBG Result:cbg-109  Possible Reasons for Event: Unknown  Comments/MD notified:protocol followed    Dillard Essex

## 2019-11-17 NOTE — Progress Notes (Signed)
Discussed TPA dosing with RN. Based on current ordered medications, pt needs 3 access sites. Currently has TL CVC with only 2 functioning lumens. Pt has been assessed by multiple VAST RN's with no appropriate peripheral site. RN discussed IV medications with MD (VAST present). New orders given to RN and TPA dose given per protocol.

## 2019-11-17 NOTE — Progress Notes (Signed)
  Echocardiogram Echocardiogram Transesophageal has been performed.  Susan [name] 11/17/2019, 3:20 PM

## 2019-11-17 NOTE — Progress Notes (Signed)
NAME:  Susan [name], MRN:  [id number], DOB:  02/07/1961, LOS: 6 ADMISSION DATE:  11/11/2019, CONSULTATION DATE:  3/10 REFERRING MD:  Sabra Heck, CHIEF COMPLAINT:  Acute encephalopathy and respiratory failure    Brief History   59 year old female with history of schizophrenia, HIV, diabetes with diabetic neuropathy and falls at home.  EMS called to the patient's house after what was called out as a fall.  Upon arrival the patient was unresponsive with agonal respiratory pattern.  She was intubated by EMS, hypotensive on arrival to ER.  Admitted with a working diagnosis of acute toxic/metabolic encephalopathy felt possibly secondary to overdose complicated by aspiration pneumonia and septic shock    Past Medical History  Frequent falls at home,Tobacco abuse, diabetes with painful polyneuropathy, bipolar disease, GERD, HIV, schizophrenia, history of IV drug abuse, seizure disorder.   Significant Hospital Events   3/10: EMS summoned to house.  Intubated on arrival, hypotensive on arrival to emergency room 3/15: Developed increased ventilator asynchrony, required heavy sedation and intermittent paralysis following attempts to wean most of the day 3/16 on Lasix drip.  Awaiting TEE.  Sputum growing MRSA.  Blood culture still pending for follow-up.  Coming back down on sedation, changed to [facility]. Consults:    Procedures:  Oral endotracheal tube 3/10 Right femoral triple-lumen catheter 3/10 (ER) right internal jugular vein triple-lumen catheter 3/10  Significant Diagnostic Tests:  CT brain 3/10>>> Normal head CT EEG 3/10>>>Moderate to severe diffuse encephalopathy. No seizures Urine drug screen 3/10>>> 3/10 acetaminophen less than 10, salicylate level less than 7 Ethanol level less than 10 11/12/2019 Echo>> EF 20-25%, LV function severely diminished, Global Hypokinesis,LV hypertrophy, Grade 1 Diastolic Dysfunction, Elevated PASP, Dilated LA, MV regurgitation, small mobile density right coronary  cusp  Micro Data:  Respiratory culture 3/10>> GS >> Mod G+ cocci, Few G- rods>> Blood culture 3/10 CD4 3/10 Urine Culture 3/10>> No growth  Antimicrobials:  Vancomycin 3/10>>> unasyn 3/10-3/11 unasyn 3/12>>>  Interim history/subjective:  Heavily sedated  Objective   Blood pressure (Abnormal) 115/57, pulse 87, temperature 99.4 F (37.4 C), temperature source Axillary, resp. rate (Abnormal) 22, height 5\' 3"  (1.6 m), weight 108.7 kg, SpO2 100 %.    Vent Mode: PCV FiO2 (%):  [50 %-100 %] 80 % Set Rate:  [22 bmp] 22 bmp PEEP:  [8 cmH20-12 cmH20] 12 cmH20 Plateau Pressure:  [27 cmH20-40 cmH20] 27 cmH20   Intake/Output Summary (Last 24 hours) at 11/17/2019 0858 Last data filed at 11/17/2019 0800 Gross per 24 hour  Intake 1727.44 ml  Output 1900 ml  Net -172.56 ml   Filed Weights   11/15/19 0435 11/16/19 0452 11/17/19 0450  Weight: 107.2 kg 108.3 kg 108.7 kg    Examination: General: Now heavily sedated 59 year old black female she is currently on full ventilatory support with escalated PEEP and FiO2 HEENT normocephalic atraumatic no jugular venous distention is appreciated left IJ triple-lumen catheter unremarkable at insertion site however 1 port is clotted Pulmonary: Diffuse rales and rhonchi equal chest rise bilaterally.  Peak inspiratory pressure 28.  Currently on PEEP of 12 and FiO2 of 80% however pulse oximetry 100% on this setting Cardiac: Regular rate and rhythm without murmur rub or gallop Abdomen: Soft nontender Extremities: Warm and dry, trace dependent edema brisk capillary refill Neuro: Currently heavily sedated on both Precedex and fentanyl  Resolved Hospital Problem list    Septic shock  Rhabdomyolysis Acute metabolic encephalopathy Elevated INR  Assessment & Plan:   ARDS/acute hypoxic and hypercarbic respiratory failure w/ staph  PNA Current every day smoker PCXR diffuse patchy changes w/out improvement  Plan Change to 54ml/kg PBW  CVP goal ~8 Wean  PEEP/fio2 Agree w/ lasix gtt F/u abg on vent change Change sedation goal to RASS -2 vanc per ID  Am cxr VAP bundle    Acute systolic heart failure Echo with EF 20-25% - potentially septic cardiomyopathy and Pneumococcal bacteremia w/  AV endocarditis   Plan Day 7 vanc and unasyn-->awaiting ID for final recs. TEE may be helpful here  Need to get CVL out; f/u BC if neg will get PICC  IV lasix gtt Titrate Norepi for MAP > 65 Ck SCVO2  Drug induced hypotension-->sedation w/ clonidine and norvasc Plan Decrease sedation goals Dc clonidine and norvasc  Acute toxic and metabolic encephalopathy.  She has a history of schizophrenia & seizure disorder.home medications Abilify, Cogentin, doxepin, Neurontin and BuSpar all on her medicine profile.  -EEG - no seizure activity -sedation increased last night  Plan Cont AEDs Begin to taper sedation again; RASS goal -2  Acute renal failure: Suspect 2/2 shock-->bumped last night  Plan Renal dose meds Avoid hypotension  Strict I&O Am chem   Fluid and electrolyte imbalance: Marked Hypernatremia, hypokalemia  Plan Add free water Replace K Am chem   HIV - well controlled Plan Cont home meds   Constipation Plan  LOC   DM w/ hyperglycemia  Plan Will increase basal dosing to 6 units every 4 hours continue sliding scale insulin  Best practice:  Diet: tube feeds. Will need swallow eval if extubated Pain/Anxiety/Delirium protocol (if indicated): precedex VAP protocol (if indicated): ordered DVT prophylaxis: [facility] GI prophylaxis: PPI Glucose control: ssi Mobility: BR Code Status: full code  Family Communication: daughter udpated by phone 3/12. Patient's visitor is listed as sister but is actually just a friend. Daughter is DPOA. Disposition: to ICU  My cct 32 min   Erick Colace ACNP-BC [place] Pager # [phone] OR # [phone] if no answer

## 2019-11-17 NOTE — Progress Notes (Signed)
[facility] Progress Note Patient Name: CHAYLEE BESANCON DOB: 10-29-1960 MRN: [id number]   Date of Service  11/17/2019  HPI/Events of Note  K+ 3.2  eICU Interventions  KCL 30 meq via NG tube Q 4 hours x 2 doses.        [name] Keondra Haydu 11/17/2019, 5:30 AM

## 2019-11-17 NOTE — H&P (Addendum)
Susan [name] is a 59 y.o. female who has presented today for surgery, with the diagnosis of bacteremia.  The various methods of treatment have been discussed with the patient's daughter. After consideration of risks, benefits and other options for treatment, the patient has consented to  Procedure(s): TRANSESOPHAGEAL ECHOCARDIOGRAM (TEE) (N/A) as a surgical intervention .  The patient's history has been reviewed, patient examined, no change in status, stable for surgery.  I have reviewed the patient's chart and labs.  Questions were answered to the patient's daughter's satisfaction.    Cristofer Yaffe C. Oval Linsey, MD, [facility]  11/17/2019 3:00 PM

## 2019-11-17 NOTE — Progress Notes (Signed)
    [facility] has been requested to perform a transesophageal echocardiogram on 03/16 for bacteremia.  After careful review of history and examination, the risks and benefits of transesophageal echocardiogram have been explained including risks of esophageal damage, perforation (1:10,000 risk), bleeding, pharyngeal hematoma as well as other potential complications associated with conscious sedation including aspiration, arrhythmia, respiratory failure and death. Alternatives to treatment were discussed, questions were answered. Patient is sedated on the vent. Her daughter, Elenore Paddy was contacted and she is willing to proceed.   **She wishes to be called with the results, [phone] Marker Sharia Averitt, PA-C 11/17/2019 10:30 AM

## 2019-11-17 NOTE — Progress Notes (Addendum)
[facility] Progress Note Patient Name: Susan [name] DOB: 1961/03/23 MRN: [id number]   Date of Service  11/17/2019  HPI/Events of Note  Pt with ventilator dyssynchrony and desaturation to 89-90 %  eICU Interventions  ABG ordered to assess oxygenation. Versed 2 mg iv x 1, Fentanyl infusion ordered to improve sedation.        [name] Azlaan Isidore 11/17/2019, 3:11 AM

## 2019-11-17 NOTE — Progress Notes (Signed)
Pt extremely desynchronous w/ vent, follow up ABG worse than this AM.  Spoke w/ RT and CCM NP to relay.  Pt switched to PS and more compliant w/ vent.  Will go down on sedation as vent compliance allows.

## 2019-11-17 NOTE — CV Procedure (Addendum)
Brief TEE Note  LVEF 55% No LA/LAA thrombus or masses Previously noted mass on the aortic valve is a node of Arancius.  Normal variant and no evidence of endocarditis. Trivial TR and trivial PR  For additional details see full report. Patient's daughter was updated with the results.   Taryn Shellhammer C. Oval Linsey, MD, [facility] 11/17/2019 2:59 PM

## 2019-11-17 NOTE — Progress Notes (Signed)
[facility] Progress Note Patient Name: [name] DOB: [birth year] MRN: [id number]   Date of Service  11/17/2019  HPI/Events of Note  Urinary retention which may be contributing to the difficulty sedating her adequately, bladder scan; 400 ml  eICU Interventions  Foley catheter ordered.        [name] Amori Cooperman 11/17/2019, 6:15 AM

## 2019-11-17 NOTE — Progress Notes (Signed)
[facility] Progress Note Patient Name: RYAN DUCRE DOB: 1960-12-28 MRN: [id number]   Date of Service  11/17/2019  HPI/Events of Note  Pt back to being extremely dyssynchronous with desaturation on the ventilator.  eICU Interventions  Versed 2 mg iv x 1, Nimbex 10 mg iv x 1, start Ketamine infusion at 0.25 mg / kg / hour.        [name] Kameko Hukill 11/17/2019, 5:08 AM

## 2019-11-17 NOTE — Progress Notes (Signed)
Peripherally Inserted Central Catheter/Midline Placement  The IV Nurse has discussed with the patient and/or persons authorized to consent for the patient, the purpose of this procedure and the potential benefits and risks involved with this procedure.  The benefits include less needle sticks, lab draws from the catheter, and the patient may be discharged home with the catheter. Risks include, but not limited to, infection, bleeding, blood clot (thrombus formation), and puncture of an artery; nerve damage and irregular heartbeat and possibility to perform a PICC exchange if needed/ordered by physician.  Alternatives to this procedure were also discussed.  Bard Power PICC patient education guide, fact sheet on infection prevention and patient information card has been provided to patient /or left at bedside.  Telephone consent obtained from daughter due to altered mental status.  PICC inserted by Enos Fling, RN  PICC/Midline Placement Documentation  PICC Triple Lumen [id number] PICC Right Basilic 39 cm 0 cm (Active)  Indication for Insertion or Continuance of Line Administration of hyperosmolar/irritating solutions (i.e. TPN, Vancomycin, etc.);Prolonged intravenous therapies 11/17/19 1626  Exposed Catheter (cm) 0 cm 11/17/19 1626  Site Assessment Clean;Dry;Intact 11/17/19 1626  Lumen #1 Status Flushed;Saline locked;Blood return noted 11/17/19 1626  Lumen #2 Status Flushed;Saline locked;Blood return noted 11/17/19 1626  Lumen #3 Status Flushed;Saline locked;Blood return noted 11/17/19 1626  Dressing Type Transparent 11/17/19 1626  Dressing Status Clean;Dry;Intact 11/17/19 1626  Dressing Intervention New dressing 11/17/19 1626  Dressing Change Due 11/24/19 11/17/19 1626       Reylynn Vanalstine, Nicolette Bang 11/17/2019, 4:27 PM

## 2019-11-17 NOTE — Progress Notes (Signed)
[facility] for Infectious [facility]  Date of Admission:  11/11/2019     Total days of antibiotics 7         ASSESSMENT:  Susan [name] has remained afebrile with stable leukocytosis requiring intermittent paralysis overnight for ventilator asynchrony. Repeat blood cultures have remained without growth to date and sputum cultures growing MRSA. Received 7 days of Unasyn and Vancomycin with 4 days from most recent fever. Renal function stable with no evidence of nephrotoxicity. Now with hypernatremia which Unasyn may be contributing. Change antimicrobial therapy to penicillin for Strep pneumoniae bacteremia. Continue respiratory failure management per CCM. HIV with viral load of 60 and CD4 471. Will continue Tivicay and Descovy.   PLAN:  1. Discontinue Unasyn and Vancomycin 2. Start Penicillin.  3. Monitor repeat blood cultures for continued clearance. 4. Continue Tivicay and Descovy for ART.   Principal Problem:   Acute respiratory failure with hypoxemia (HCC) Active Problems:   Pneumococcal bacteremia   HIV disease (HCC)   AKI (acute kidney injury) ([place])   Pneumonia   Aortic valve endocarditis   . benztropine  0.5 mg Per Tube Daily  . budesonide (PULMICORT) nebulizer solution  0.5 mg Nebulization BID  . busPIRone  15 mg Per Tube TID  . chlorhexidine gluconate (MEDLINE KIT)  15 mL Mouth Rinse BID  . Chlorhexidine Gluconate Cloth  6 each Topical Daily  . dolutegravir  50 mg Oral Daily  . doxepin  50 mg Per Tube QHS  . emtricitabine-tenofovir AF  1 tablet Per Tube Daily  . feeding supplement (VITAL HIGH PROTEIN)  1,000 mL Per Tube Q24H  . free water  300 mL Per Tube Q4H  . gabapentin  300 mg Per Tube Q8H  . heparin injection (subcutaneous)  5,000 Units Subcutaneous Q8H  . insulin aspart  0-15 Units Subcutaneous Q4H  . insulin aspart  6 Units Subcutaneous Q4H  . ipratropium-albuterol  3 mL Nebulization QID  . mouth rinse  15 mL Mouth Rinse 10 times per day  . pantoprazole  sodium  40 mg Per Tube Daily  . potassium chloride  40 mEq Per Tube Once  . rosuvastatin  20 mg Per Tube q1800  . sennosides  15 mL Per Tube BID  . sertraline  50 mg Per Tube Daily    SUBJECTIVE:  Afebrile overnight with mild increase in leukocytosis now at 13.8. Intermittent paralysis overnight due to ventilator asynchrony. Respiratory culture with MRSA. Repeat blood cultures from 3/12 are without growth. Remains on ventilator.   Allergies  Allergen Reactions  . Lyrica [Pregabalin] Swelling    Has LE swelling     Review of Systems: Review of Systems  Unable to perform ROS: Intubated    OBJECTIVE: Vitals:   11/17/19 0500 11/17/19 0600 11/17/19 0700 11/17/19 0800  BP: (!) 99/52 97/61 (!) 115/57   Pulse: (!) 122 87 87   Resp: (!) 24 (!) 23 (!) 22   Temp:   99.4 F (37.4 C)   TempSrc:   Axillary   SpO2: (!) 88% 99% 100% 100%  Weight:      Height:    5' 3"  (1.6 m)   Body mass index is 42.45 kg/m.  Physical Exam Constitutional:      General: She is not in acute distress.    Appearance: She is well-developed.  Cardiovascular:     Rate and Rhythm: Normal rate and regular rhythm.     Heart sounds: Normal heart sounds.  Pulmonary:  Effort: Pulmonary effort is normal.     Breath sounds: Normal breath sounds.  Skin:    General: Skin is warm and dry.  Neurological:     Mental Status: She is alert and oriented to person, place, and time.  Psychiatric:        Behavior: Behavior normal.        Thought Content: Thought content normal.        Judgment: Judgment normal.     Lab Results Lab Results  Component Value Date   WBC 13.8 (H) 11/17/2019   HGB 11.7 (L) 11/17/2019   HCT 36.0 11/17/2019   MCV 84.7 11/17/2019   PLT 107 (L) 11/17/2019    Lab Results  Component Value Date   CREATININE 2.26 (H) 11/17/2019   BUN 35 (H) 11/17/2019   NA 150 (H) 11/17/2019   K 3.2 (L) 11/17/2019   CL 109 11/17/2019   CO2 27 11/17/2019    Lab Results  Component Value Date     ALT 18 11/14/2019   AST 42 (H) 11/14/2019   ALKPHOS 103 11/14/2019   BILITOT 0.6 11/14/2019     Microbiology: Recent Results (from the past 240 hour(s))  Culture, blood (Routine x 2)     Status: Abnormal   Collection Time: 11/11/19  2:51 PM   Specimen: BLOOD  Result Value Ref Range Status   Specimen Description BLOOD CENTRAL LINE  Final   Special Requests   Final    BOTTLES DRAWN AEROBIC AND ANAEROBIC Blood Culture adequate volume   Culture  Setup Time   Final    GRAM POSITIVE COCCI IN CHAINS AEROBIC BOTTLE ONLY CRITICAL RESULT CALLED TO, READ BACK BY AND VERIFIED WITH: Salli Real [id number] Performed at [facility], [place] [address] [place], [place] [address]    Culture STREPTOCOCCUS PNEUMONIAE (A)  Final   Report Status 11/14/2019 FINAL  Final   Organism ID, Bacteria STREPTOCOCCUS PNEUMONIAE  Final      Susceptibility   Streptococcus pneumoniae - MIC*    ERYTHROMYCIN <=0.12 SENSITIVE Sensitive     LEVOFLOXACIN 0.5 SENSITIVE Sensitive     VANCOMYCIN <=0.12 SENSITIVE Sensitive     PENICILLIN (meningitis) <=0.06 SENSITIVE Sensitive     PENO - penicillin <=0.06      PENICILLIN (non-meningitis) <=0.06 SENSITIVE Sensitive     PENICILLIN (oral) <=0.06 SENSITIVE Sensitive     CEFTRIAXONE (non-meningitis) <=0.12 SENSITIVE Sensitive     CEFTRIAXONE (meningitis) <=0.12 SENSITIVE Sensitive     * STREPTOCOCCUS PNEUMONIAE  Culture, blood (Routine x 2)     Status: None   Collection Time: 11/11/19  2:56 PM   Specimen: BLOOD RIGHT HAND  Result Value Ref Range Status   Specimen Description BLOOD RIGHT HAND  Final   Special Requests   Final    BOTTLES DRAWN AEROBIC ONLY Blood Culture results may not be optimal due to an inadequate volume of blood received in culture bottles   Culture NO GROWTH 5 DAYS  Final   Report Status 11/16/2019 FINAL  Final  Urine culture     Status: None   Collection Time: 11/11/19  3:11 PM   Specimen: Urine, Catheterized  Result Value Ref  Range Status   Specimen Description URINE, CATHETERIZED  Final   Special Requests NONE  Final   Culture   Final    NO GROWTH Performed at [facility], [address], [place], [place] [address]    Report Status 11/12/2019 FINAL  Final  Respiratory Panel by RT PCR (Flu A&B, Covid) - Nasopharyngeal Swab     Status: None   Collection Time: 11/11/19  3:13 PM   Specimen: Nasopharyngeal Swab  Result Value Ref Range Status   SARS Coronavirus 2 by RT PCR NEGATIVE NEGATIVE Final    Comment: (NOTE) SARS-CoV-2 target nucleic acids are NOT DETECTED. The SARS-CoV-2 RNA is generally detectable in upper respiratoy specimens during the acute phase of infection. The lowest concentration of SARS-CoV-2 viral copies this assay can detect is 131 copies/mL. A negative result does not preclude SARS-Cov-2 infection and should not be used as the sole basis for treatment or other patient management decisions. A negative result may occur with  improper specimen collection/handling, submission of specimen other than nasopharyngeal swab, presence of viral mutation(s) within the areas targeted by this assay, and inadequate number of viral copies (<131 copies/mL). A negative result must be combined with clinical observations, patient history, and epidemiological information. The expected result is Negative. Fact Sheet for Patients:  PinkCheek.be Fact Sheet for Healthcare Providers:  GravelBags.it This test is not yet ap proved or cleared by the [place] FDA and  has been authorized for detection and/or diagnosis of SARS-CoV-2 by FDA under an Emergency Use Authorization (EUA). This EUA will remain  in effect (meaning this test can be used) for the duration of the COVID-19 declaration under Section 564(b)(1) of the Act, 21 U.S.C. section 360bbb-3(b)(1), unless the authorization is terminated or revoked sooner.    Influenza A by PCR NEGATIVE  NEGATIVE Final   Influenza B by PCR NEGATIVE NEGATIVE Final    Comment: (NOTE) The Xpert Xpress SARS-CoV-2/FLU/RSV assay is intended as an aid in  the diagnosis of influenza from Nasopharyngeal swab specimens and  should not be used as a sole basis for treatment. Nasal washings and  aspirates are unacceptable for Xpert Xpress SARS-CoV-2/FLU/RSV  testing. Fact Sheet for Patients: PinkCheek.be Fact Sheet for Healthcare Providers: GravelBags.it This test is not yet approved or cleared by the [place] FDA and  has been authorized for detection and/or diagnosis of SARS-CoV-2 by  FDA under an Emergency Use Authorization (EUA). This EUA will remain  in effect (meaning this test can be used) for the duration of the  Covid-19 declaration under Section 564(b)(1) of the Act, 21  U.S.C. section 360bbb-3(b)(1), unless the authorization is  terminated or revoked. Performed at [facility], [place] [address], [place], [place] [address]   MRSA PCR Screening     Status: Abnormal   Collection Time: 11/11/19  8:23 PM   Specimen: Nasal Mucosa; Nasopharyngeal  Result Value Ref Range Status   MRSA by PCR POSITIVE (A) NEGATIVE Final    Comment: CRITICAL RESULT CALLED TO, READ BACK BY AND VERIFIED WITH: RN Cindee Salt [id number] @[id number]  THANEY Performed at [facility], [place] [address], [place], [place] [address]   Culture, respiratory (non-expectorated)     Status: None   Collection Time: 11/12/19  6:31 AM   Specimen: Tracheal Aspirate; Respiratory  Result Value Ref Range Status   Specimen Description TRACHEAL ASPIRATE  Final   Special Requests NONE  Final   Gram Stain   Final    FEW WBC PRESENT, PREDOMINANTLY PMN MODERATE GRAM POSITIVE COCCI FEW GRAM NEGATIVE RODS Performed at [facility], [place] [address] [place], [place] [address]    Culture   Final    RARE METHICILLIN RESISTANT STAPHYLOCOCCUS AUREUS RARE CANDIDA ALBICANS      Report Status 11/16/2019  FINAL  Final   Organism ID, Bacteria METHICILLIN RESISTANT STAPHYLOCOCCUS AUREUS  Final      Susceptibility   Methicillin resistant staphylococcus aureus - MIC*    CIPROFLOXACIN >=8 RESISTANT Resistant     ERYTHROMYCIN >=8 RESISTANT Resistant     GENTAMICIN <=0.5 SENSITIVE Sensitive     OXACILLIN >=4 RESISTANT Resistant     TETRACYCLINE <=1 SENSITIVE Sensitive     VANCOMYCIN <=0.5 SENSITIVE Sensitive     TRIMETH/SULFA <=10 SENSITIVE Sensitive     CLINDAMYCIN <=0.25 SENSITIVE Sensitive     RIFAMPIN <=0.5 SENSITIVE Sensitive     Inducible Clindamycin NEGATIVE Sensitive     * RARE METHICILLIN RESISTANT STAPHYLOCOCCUS AUREUS  Culture, blood (routine x 2)     Status: None (Preliminary result)   Collection Time: 11/13/19  6:21 PM   Specimen: BLOOD LEFT HAND  Result Value Ref Range Status   Specimen Description BLOOD LEFT HAND  Final   Special Requests   Final    BOTTLES DRAWN AEROBIC ONLY Blood Culture adequate volume   Culture   Final    NO GROWTH 4 DAYS Performed at [facility], [address], [place], [place] [address]    Report Status PENDING  Incomplete  Culture, blood (routine x 2)     Status: None (Preliminary result)   Collection Time: 11/13/19  6:31 PM   Specimen: BLOOD LEFT HAND  Result Value Ref Range Status   Specimen Description BLOOD LEFT HAND  Final   Special Requests   Final    BOTTLES DRAWN AEROBIC ONLY Blood Culture adequate volume   Culture   Final    NO GROWTH 4 DAYS Performed at [facility], [place] [address], [place], [place] [address]    Report Status PENDING  Incomplete     Terri Piedra, [facility]   11/17/2019  10:30 AM

## 2019-11-17 NOTE — Progress Notes (Addendum)
eLink Physician-Brief Progress Note Patient Name: Susan [name] DOB: 1961-01-24 MRN: [id number]   Date of Service  11/17/2019  HPI/Events of Note  Persistent ventilator dyssynchrony despite Fentanyl and versed added to Precedex infusion.  eICU Interventions  Nimbex 10 mg iv x 1, PEEP increased to 12 and FIO2 increased to 70 % for ABG with PO2 of 49 mmHg. If oxygenation does not improve will scan her chest to r/o PE. Lasix infusion started to optimize lung water.        [name] Paighton Godette 11/17/2019, 4:03 AM

## 2019-11-18 ENCOUNTER — Inpatient Hospital Stay (HOSPITAL_COMMUNITY): Payer: Medicare Other

## 2019-11-18 DIAGNOSIS — Z888 Allergy status to other drugs, medicaments and biological substances status: Secondary | ICD-10-CM | POA: Diagnosis not present

## 2019-11-18 DIAGNOSIS — J9601 Acute respiratory failure with hypoxia: Secondary | ICD-10-CM | POA: Diagnosis not present

## 2019-11-18 DIAGNOSIS — R7881 Bacteremia: Secondary | ICD-10-CM | POA: Diagnosis not present

## 2019-11-18 DIAGNOSIS — B20 Human immunodeficiency virus [HIV] disease: Secondary | ICD-10-CM | POA: Diagnosis not present

## 2019-11-18 DIAGNOSIS — B953 Streptococcus pneumoniae as the cause of diseases classified elsewhere: Secondary | ICD-10-CM | POA: Diagnosis not present

## 2019-11-18 DIAGNOSIS — Z9911 Dependence on respirator [ventilator] status: Secondary | ICD-10-CM | POA: Diagnosis not present

## 2019-11-18 LAB — CULTURE, BLOOD (ROUTINE X 2)
Culture: NO GROWTH
Culture: NO GROWTH
Special Requests: ADEQUATE
Special Requests: ADEQUATE

## 2019-11-18 LAB — POCT I-STAT 7, (LYTES, BLD GAS, ICA,H+H)
Acid-Base Excess: 6 mmol/L — ABNORMAL HIGH (ref 0.0–2.0)
Bicarbonate: 32.6 mmol/L — ABNORMAL HIGH (ref 20.0–28.0)
Calcium, Ion: 1.15 mmol/L (ref 1.15–1.40)
HCT: 34 % — ABNORMAL LOW (ref 36.0–46.0)
Hemoglobin: 11.6 g/dL — ABNORMAL LOW (ref 12.0–15.0)
O2 Saturation: 88 %
Potassium: 3.6 mmol/L (ref 3.5–5.1)
Sodium: 149 mmol/L — ABNORMAL HIGH (ref 135–145)
TCO2: 34 mmol/L — ABNORMAL HIGH (ref 22–32)
pCO2 arterial: 55.3 mmHg — ABNORMAL HIGH (ref 32.0–48.0)
pH, Arterial: 7.378 (ref 7.350–7.450)
pO2, Arterial: 58 mmHg — ABNORMAL LOW (ref 83.0–108.0)

## 2019-11-18 LAB — BASIC METABOLIC PANEL
Anion gap: 15 (ref 5–15)
Anion gap: 14 (ref 5–15)
BUN: 34 mg/dL — ABNORMAL HIGH (ref 6–20)
BUN: 32 mg/dL — ABNORMAL HIGH (ref 6–20)
CO2: 29 mmol/L (ref 22–32)
CO2: 28 mmol/L (ref 22–32)
Calcium: 8.1 mg/dL — ABNORMAL LOW (ref 8.9–10.3)
Calcium: 7.4 mg/dL — ABNORMAL LOW (ref 8.9–10.3)
Chloride: 106 mmol/L (ref 98–111)
Chloride: 102 mmol/L (ref 98–111)
Creatinine, Ser: 2.31 mg/dL — ABNORMAL HIGH (ref 0.44–1.00)
Creatinine, Ser: 2.24 mg/dL — ABNORMAL HIGH (ref 0.44–1.00)
GFR calc Af Amer: 26 mL/min — ABNORMAL LOW (ref >60)
GFR calc Af Amer: 27 mL/min — ABNORMAL LOW (ref >60)
GFR calc non Af Amer: 23 mL/min — ABNORMAL LOW (ref >60)
GFR calc non Af Amer: 24 mL/min — ABNORMAL LOW (ref >60)
Glucose, Bld: 164 mg/dL — ABNORMAL HIGH (ref 70–99)
Glucose, Bld: 320 mg/dL — ABNORMAL HIGH (ref 70–99)
Potassium: 3.6 mmol/L (ref 3.5–5.1)
Potassium: 4.3 mmol/L (ref 3.5–5.1)
Sodium: 150 mmol/L — ABNORMAL HIGH (ref 135–145)
Sodium: 144 mmol/L (ref 135–145)

## 2019-11-18 LAB — GLUCOSE, CAPILLARY
Glucose-Capillary: 120 mg/dL — ABNORMAL HIGH (ref 70–99)
Glucose-Capillary: 132 mg/dL — ABNORMAL HIGH (ref 70–99)
Glucose-Capillary: 135 mg/dL — ABNORMAL HIGH (ref 70–99)
Glucose-Capillary: 144 mg/dL — ABNORMAL HIGH (ref 70–99)
Glucose-Capillary: 157 mg/dL — ABNORMAL HIGH (ref 70–99)
Glucose-Capillary: 164 mg/dL — ABNORMAL HIGH (ref 70–99)
Glucose-Capillary: 164 mg/dL — ABNORMAL HIGH (ref 70–99)

## 2019-11-18 IMAGING — DX DG CHEST 1V PORT
1 series · 2 of 2 positions shown · non-contrast
Comparison: [DATE].

CLINICAL DATA: Respiratory failure/hypoxia

EXAM:
PORTABLE CHEST 1 VIEW

[Series 1: chest · 0.14mm/px · 2 of 2 slices shown]
[im 1/2]
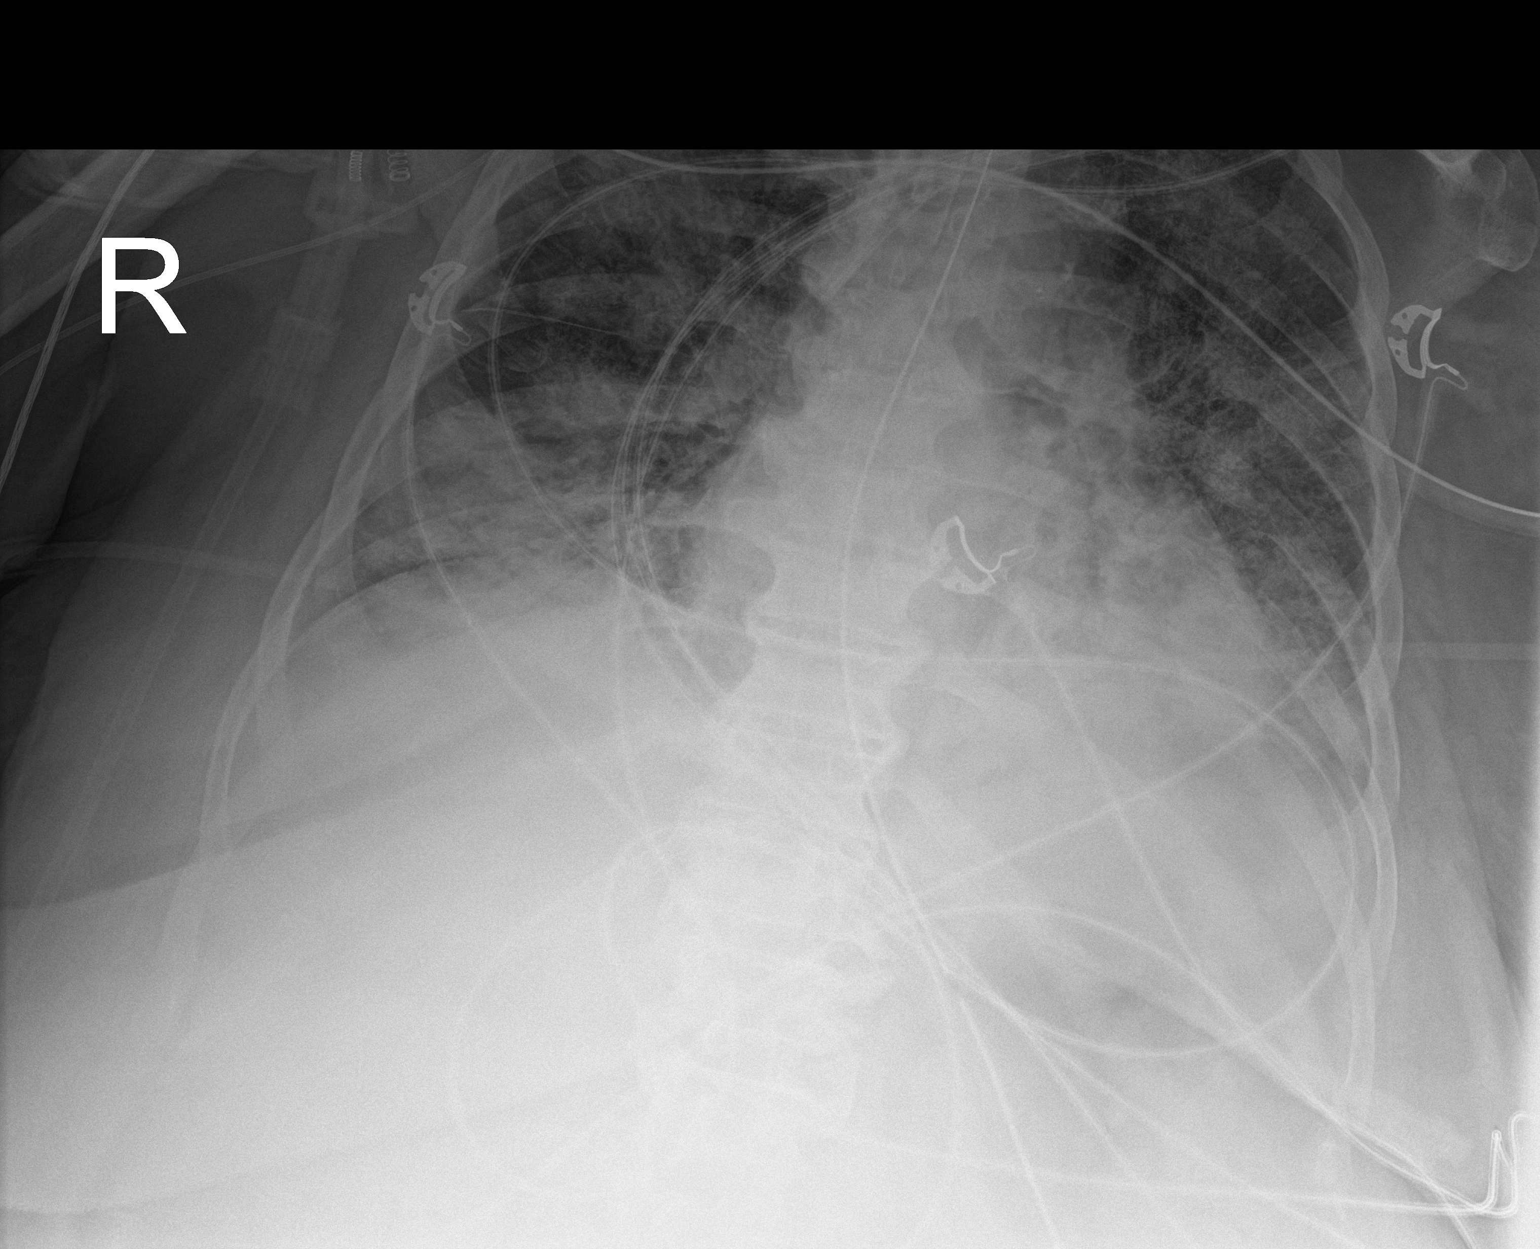
[im 2/2]
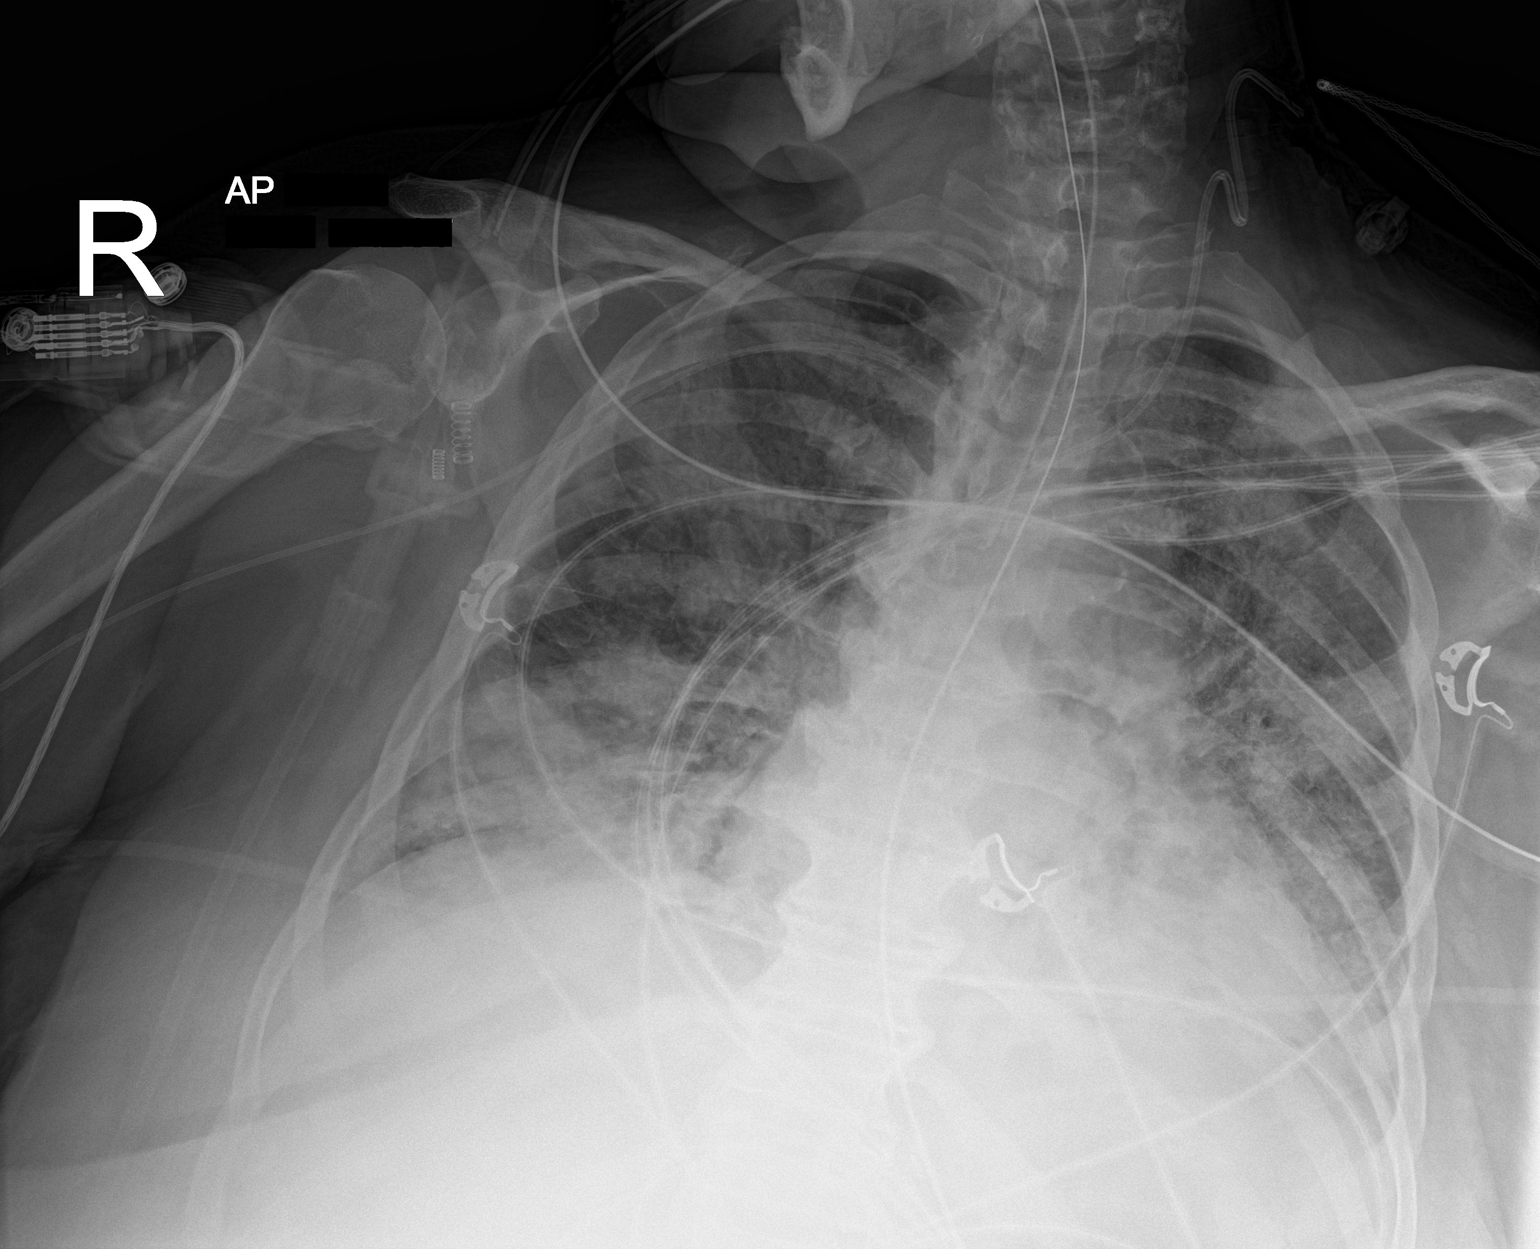

[2 of 2 positions shown; findings below may reference images not displayed]

FINDINGS: Endotracheal tube tip is 1.0 cm above the carina. Nasogastric tube
tip and side port are in the stomach. Central catheter tip is in the
superior vena cava. No pneumothorax. Multifocal airspace opacity
appear stable without appreciable change. Consolidation is greatest
in the right base region. Heart is upper normal in size with
pulmonary vascularity normal. No adenopathy. No bone lesions.
IMPRESSION: Tube and catheter positions as described without pneumothorax. Note
that the endotracheal tube tip is near the carina; it may be prudent
to consider withdrawing endotracheal tube 2 to 3 cm.

Areas of airspace opacity at multiple sites, likely due to
multifocal pneumonia, appear stable. Stable cardiac silhouette.

## 2019-11-18 MED ORDER — FREE WATER
300.0000 mL | Status: DC
  Administered 2019-11-18 – 2019-11-19 (×5): 300 mL

## 2019-11-18 MED ORDER — POTASSIUM CHLORIDE 20 MEQ/15ML (10%) PO SOLN
20.0000 meq | Freq: Once | ORAL | Status: AC
  Administered 2019-11-18: 20 meq
  Filled 2019-11-18: qty 15

## 2019-11-18 MED ORDER — METOLAZONE 5 MG PO TABS
10.0000 mg | ORAL_TABLET | Freq: Once | ORAL | Status: AC
  Administered 2019-11-18: 10 mg
  Filled 2019-11-18: qty 2
  Filled 2019-11-18: qty 1

## 2019-11-18 NOTE — Progress Notes (Signed)
Video call with daughter.

## 2019-11-18 NOTE — Progress Notes (Addendum)
Pt assessed at 2000 last night and found to be unresponsive to painful stimuli, pupils 1 and brisk. Pt on fentanyl at 290mcg and precedex at 62mcg. Fentanyl and precedex titrated down t/o night. Around 0230, pt began having short episodes where RR-40s, pt coughing, HR dropping to 40s, and SpO2 dropping to 88%. Pt still unresponsive to painful stimulation, however,  titration of sedation stopped d/t these episodes. Will pass along happenings to day shift RN.

## 2019-11-18 NOTE — Progress Notes (Signed)
[facility] Progress Note Patient Name: Susan [name] DOB: [birth year] MRN: [id number]   Date of Service  11/18/2019  HPI/Events of Note  K+ 3.6, Creatinine 2.1  eICU Interventions  KCL 20 meq via NG tube x 1     Intervention Category Intermediate Interventions: Electrolyte abnormality - evaluation and management  Frederik Pear 11/18/2019, 6:48 AM

## 2019-11-18 NOTE — Progress Notes (Addendum)
Pt had episode of bradycardia/junctional rhythm in 40s, tachypnea in 40s, Sp02 dropped to 80s.  RT at bedside, turned FiO2 to 80%, RN increased sedation.  Updated provider.  SpO2 now in low 90s, pt back in NSR, RR in low 30s.

## 2019-11-18 NOTE — Progress Notes (Signed)
[facility]  Date of Admission:  11/11/2019     Total days of antibiotics 8         ASSESSMENT:  [name] blood cultures have been finalized without growth and she has been afebrile. Continues on penicillin at this time. TEE without evidence of endocarditis and EF of 55%. Treatment duration can be narrowed to about 2 weeks. Acute respiratory failure remains a challenge with ventilator asynchrony at times. Appreciate CCM's management. HIV remains well controlled and will continue with Tivicay and Descovy.  PLAN:  1. Continue penicillin with goal of therapy of 2 weeks. 2. Continue Tivicay and Descovy for ART.  3. Ventilatory management and respiratory failure per CCM.   Principal Problem:   Acute respiratory failure with hypoxemia (HCC) Active Problems:   Pneumococcal bacteremia   HIV disease (HCC)   Drug-induced hypotension   AKI (acute kidney injury) ([place])   Pneumonia   Aortic valve endocarditis   Acute kidney injury (AKI) with acute tubular necrosis (ATN) (HCC)   Acute metabolic encephalopathy   . benztropine  0.5 mg Per Tube Daily  . budesonide (PULMICORT) nebulizer solution  0.5 mg Nebulization BID  . busPIRone  15 mg Per Tube TID  . chlorhexidine gluconate (MEDLINE KIT)  15 mL Mouth Rinse BID  . Chlorhexidine Gluconate Cloth  6 each Topical Daily  . dolutegravir  50 mg Oral Daily  . doxepin  50 mg Per Tube QHS  . emtricitabine-tenofovir AF  1 tablet Per Tube Daily  . feeding supplement (VITAL HIGH PROTEIN)  1,000 mL Per Tube Q24H  . free water  300 mL Per Tube Q4H  . gabapentin  300 mg Per Tube Q8H  . heparin injection (subcutaneous)  5,000 Units Subcutaneous Q8H  . insulin aspart  0-15 Units Subcutaneous Q4H  . insulin aspart  6 Units Subcutaneous Q4H  . ipratropium-albuterol  3 mL Nebulization QID  . mouth rinse  15 mL Mouth Rinse 10 times per day  . pantoprazole sodium  40 mg Per Tube Daily  . potassium chloride  20 mEq Per Tube  Once  . rosuvastatin  20 mg Per Tube q1800  . sennosides  15 mL Per Tube BID  . sertraline  50 mg Per Tube Daily    SUBJECTIVE:  Afebrile overnight with concerns for decreased responsiveness to painful stimuli possibly related to sedation. TEE without evidence of endocarditis and EF of 55%. Repeat blood cultures finalized without growth. [name] remains on the ventilator at present.   Allergies  Allergen Reactions  . Lyrica [Pregabalin] Swelling    Has LE swelling     Review of Systems: Review of Systems  Unable to perform ROS: Intubated      OBJECTIVE: Vitals:   11/18/19 0600 11/18/19 0700 11/18/19 0800 11/18/19 0841  BP: 122/68 110/66  122/63  Pulse: 68 66  72  Resp: (!) 26 (!) 26  (!) 36  Temp:   98.4 F (36.9 C)   TempSrc:      SpO2: 92% 91%  92%  Weight: 107.1 kg     Height:       Body mass index is 41.83 kg/m.  Physical Exam Constitutional:      General: She is not in acute distress.    Appearance: She is well-developed.     Interventions: She is intubated and restrained.     Comments: Lying in bed with head of bed elevated; intubated.   Cardiovascular:  Rate and Rhythm: Normal rate and regular rhythm.     Heart sounds: Normal heart sounds.  Pulmonary:     Effort: Pulmonary effort is normal. She is intubated.     Breath sounds: Normal breath sounds.  Skin:    General: Skin is warm and dry.  Neurological:     Mental Status: She is alert.     Lab Results Lab Results  Component Value Date   WBC 13.8 (H) 11/17/2019   HGB 11.6 (L) 11/18/2019   HCT 34.0 (L) 11/18/2019   MCV 84.7 11/17/2019   PLT 107 (L) 11/17/2019    Lab Results  Component Value Date   CREATININE 2.31 (H) 11/18/2019   BUN 34 (H) 11/18/2019   NA 149 (H) 11/18/2019   K 3.6 11/18/2019   CL 106 11/18/2019   CO2 29 11/18/2019    Lab Results  Component Value Date   ALT 18 11/14/2019   AST 42 (H) 11/14/2019   ALKPHOS 103 11/14/2019   BILITOT 0.6 11/14/2019      Microbiology: Recent Results (from the past 240 hour(s))  Culture, blood (Routine x 2)     Status: Abnormal   Collection Time: 11/11/19  2:51 PM   Specimen: BLOOD  Result Value Ref Range Status   Specimen Description BLOOD CENTRAL LINE  Final   Special Requests   Final    BOTTLES DRAWN AEROBIC AND ANAEROBIC Blood Culture adequate volume   Culture  Setup Time   Final    GRAM POSITIVE COCCI IN CHAINS AEROBIC BOTTLE ONLY CRITICAL RESULT CALLED TO, READ BACK BY AND VERIFIED WITH: Salli Real [id number] Performed at [facility], [place] [address] [place], [place] [address]    Culture STREPTOCOCCUS PNEUMONIAE (A)  Final   Report Status 11/14/2019 FINAL  Final   Organism ID, Bacteria STREPTOCOCCUS PNEUMONIAE  Final      Susceptibility   Streptococcus pneumoniae - MIC*    ERYTHROMYCIN <=0.12 SENSITIVE Sensitive     LEVOFLOXACIN 0.5 SENSITIVE Sensitive     VANCOMYCIN <=0.12 SENSITIVE Sensitive     PENICILLIN (meningitis) <=0.06 SENSITIVE Sensitive     PENO - penicillin <=0.06      PENICILLIN (non-meningitis) <=0.06 SENSITIVE Sensitive     PENICILLIN (oral) <=0.06 SENSITIVE Sensitive     CEFTRIAXONE (non-meningitis) <=0.12 SENSITIVE Sensitive     CEFTRIAXONE (meningitis) <=0.12 SENSITIVE Sensitive     * STREPTOCOCCUS PNEUMONIAE  Culture, blood (Routine x 2)     Status: None   Collection Time: 11/11/19  2:56 PM   Specimen: BLOOD RIGHT HAND  Result Value Ref Range Status   Specimen Description BLOOD RIGHT HAND  Final   Special Requests   Final    BOTTLES DRAWN AEROBIC ONLY Blood Culture results may not be optimal due to an inadequate volume of blood received in culture bottles   Culture NO GROWTH 5 DAYS  Final   Report Status 11/16/2019 FINAL  Final  Urine culture     Status: None   Collection Time: 11/11/19  3:11 PM   Specimen: Urine, Catheterized  Result Value Ref Range Status   Specimen Description URINE, CATHETERIZED  Final   Special Requests NONE  Final   Culture    Final    NO GROWTH Performed at [facility], [address] [place], [place] [address]    Report Status 11/12/2019 FINAL  Final  Respiratory Panel by RT PCR (Flu A&B, Covid) - Nasopharyngeal Swab  Status: None   Collection Time: 11/11/19  3:13 PM   Specimen: Nasopharyngeal Swab  Result Value Ref Range Status   SARS Coronavirus 2 by RT PCR NEGATIVE NEGATIVE Final    Comment: (NOTE) SARS-CoV-2 target nucleic acids are NOT DETECTED. The SARS-CoV-2 RNA is generally detectable in upper respiratoy specimens during the acute phase of infection. The lowest concentration of SARS-CoV-2 viral copies this assay can detect is 131 copies/mL. A negative result does not preclude SARS-Cov-2 infection and should not be used as the sole basis for treatment or other patient management decisions. A negative result may occur with  improper specimen collection/handling, submission of specimen other than nasopharyngeal swab, presence of viral mutation(s) within the areas targeted by this assay, and inadequate number of viral copies (<131 copies/mL). A negative result must be combined with clinical observations, patient history, and epidemiological information. The expected result is Negative. Fact Sheet for Patients:  PinkCheek.be Fact Sheet for Healthcare Providers:  GravelBags.it This test is not yet ap proved or cleared by the [place] FDA and  has been authorized for detection and/or diagnosis of SARS-CoV-2 by FDA under an Emergency Use Authorization (EUA). This EUA will remain  in effect (meaning this test can be used) for the duration of the COVID-19 declaration under Section 564(b)(1) of the Act, 21 U.S.C. section 360bbb-3(b)(1), unless the authorization is terminated or revoked sooner.    Influenza A by PCR NEGATIVE NEGATIVE Final   Influenza B by PCR NEGATIVE NEGATIVE Final    Comment: (NOTE) The Xpert Xpress  SARS-CoV-2/FLU/RSV assay is intended as an aid in  the diagnosis of influenza from Nasopharyngeal swab specimens and  should not be used as a sole basis for treatment. Nasal washings and  aspirates are unacceptable for Xpert Xpress SARS-CoV-2/FLU/RSV  testing. Fact Sheet for Patients: PinkCheek.be Fact Sheet for Healthcare Providers: GravelBags.it This test is not yet approved or cleared by the [place] FDA and  has been authorized for detection and/or diagnosis of SARS-CoV-2 by  FDA under an Emergency Use Authorization (EUA). This EUA will remain  in effect (meaning this test can be used) for the duration of the  Covid-19 declaration under Section 564(b)(1) of the Act, 21  U.S.C. section 360bbb-3(b)(1), unless the authorization is  terminated or revoked. Performed at [facility], [place] [address], [place], [place] [address]   MRSA PCR Screening     Status: Abnormal   Collection Time: 11/11/19  8:23 PM   Specimen: Nasal Mucosa; Nasopharyngeal  Result Value Ref Range Status   MRSA by PCR POSITIVE (A) NEGATIVE Final    Comment: CRITICAL RESULT CALLED TO, READ BACK BY AND VERIFIED WITH: RN Cindee Salt [id number]  Delight Stare Performed at [facility], [place] [address], [place], [place] [address]   Culture, respiratory (non-expectorated)     Status: None   Collection Time: 11/12/19  6:31 AM   Specimen: Tracheal Aspirate; Respiratory  Result Value Ref Range Status   Specimen Description TRACHEAL ASPIRATE  Final   Special Requests NONE  Final   Gram Stain   Final    FEW WBC PRESENT, PREDOMINANTLY PMN MODERATE GRAM POSITIVE COCCI FEW GRAM NEGATIVE RODS Performed at [facility], [place] [address] [place], [place] [address]    Culture   Final    RARE METHICILLIN RESISTANT STAPHYLOCOCCUS AUREUS RARE CANDIDA ALBICANS    Report Status 11/16/2019 FINAL  Final   Organism ID, Bacteria METHICILLIN RESISTANT  STAPHYLOCOCCUS AUREUS  Final  Susceptibility   Methicillin resistant staphylococcus aureus - MIC*    CIPROFLOXACIN >=8 RESISTANT Resistant     ERYTHROMYCIN >=8 RESISTANT Resistant     GENTAMICIN <=0.5 SENSITIVE Sensitive     OXACILLIN >=4 RESISTANT Resistant     TETRACYCLINE <=1 SENSITIVE Sensitive     VANCOMYCIN <=0.5 SENSITIVE Sensitive     TRIMETH/SULFA <=10 SENSITIVE Sensitive     CLINDAMYCIN <=0.25 SENSITIVE Sensitive     RIFAMPIN <=0.5 SENSITIVE Sensitive     Inducible Clindamycin NEGATIVE Sensitive     * RARE METHICILLIN RESISTANT STAPHYLOCOCCUS AUREUS  Culture, blood (routine x 2)     Status: None   Collection Time: 11/13/19  6:21 PM   Specimen: BLOOD LEFT HAND  Result Value Ref Range Status   Specimen Description BLOOD LEFT HAND  Final   Special Requests   Final    BOTTLES DRAWN AEROBIC ONLY Blood Culture adequate volume   Culture   Final    NO GROWTH 5 DAYS Performed at [facility], [address], [place], [place] [address]    Report Status 11/18/2019 FINAL  Final  Culture, blood (routine x 2)     Status: None   Collection Time: 11/13/19  6:31 PM   Specimen: BLOOD LEFT HAND  Result Value Ref Range Status   Specimen Description BLOOD LEFT HAND  Final   Special Requests   Final    BOTTLES DRAWN AEROBIC ONLY Blood Culture adequate volume   Culture   Final    NO GROWTH 5 DAYS Performed at [facility], [place] [address], [place], [place] [address]    Report Status 11/18/2019 FINAL  Final     Terri Piedra, NP [facility]  11/18/2019  9:49 AM

## 2019-11-18 NOTE — Progress Notes (Signed)
Pt's ETT was pulled back from 24 to 21 at the lip per MD. No complications were noted after change.

## 2019-11-18 NOTE — Progress Notes (Addendum)
NAME:  Susan [name], MRN:  [id number], DOB:  10-12-1960, LOS: 7 ADMISSION DATE:  11/11/2019, CONSULTATION DATE:  3/10 REFERRING MD:  Sabra Heck, CHIEF COMPLAINT:  Acute encephalopathy and respiratory failure    Brief History   59 year old female with history of schizophrenia, HIV, diabetes with diabetic neuropathy and falls at home.  EMS called to the patient's house after what was called out as a fall.  Upon arrival the patient was unresponsive with agonal respiratory pattern.  She was intubated by EMS, hypotensive on arrival to ER.  Admitted with a working diagnosis of acute toxic/metabolic encephalopathy felt possibly secondary to overdose complicated by aspiration pneumonia and septic shock    Past Medical History  Frequent falls at home,Tobacco abuse, diabetes with painful polyneuropathy, bipolar disease, GERD, HIV, schizophrenia, history of IV drug abuse, seizure disorder.   Significant Hospital Events   3/10: EMS summoned to house.  Intubated on arrival, hypotensive on arrival to emergency room 3/15: Developed increased ventilator asynchrony, required heavy sedation and intermittent paralysis following attempts to wean most of the day 3/16 on Lasix drip.  Awaiting TEE.  Sputum growing MRSA.  Blood culture still pending for follow-up.  Coming back down on sedation, changed to [facility]. TEE showing NO EVIDENCE of endocarditis. ABX changed to pen G per ID. MRSA therapy complete. EF improved. Now up to 50% from 20-25% 3/17 still heavily sedated. Having episodes of bradycardia. Still massively overloaded (>14L), increasing lasix gtt to 8 mg from 4. Cont RASS goal -2 adding metolazone.   Consults:  ID Cards for TEE  Procedures:  Oral endotracheal tube 3/10 Right femoral triple-lumen catheter 3/10 (ER) right internal jugular vein triple-lumen catheter 3/10 Left IJ 3/10>>>3/17 Right UE PICC 3/16>>>  Significant Diagnostic Tests:  CT brain 3/10>>> Normal head CT EEG 3/10>>>Moderate to severe  diffuse encephalopathy. No seizures Urine drug screen 3/10>>> 3/10 acetaminophen less than 10, salicylate level less than 7 Ethanol level less than 10 11/12/2019 Echo>> EF 20-25%, LV function severely diminished, Global Hypokinesis,LV hypertrophy, Grade 1 Diastolic Dysfunction, Elevated PASP, Dilated LA, MV regurgitation, small mobile density right coronary cusp TEE 3/16: LVEF 55% No LA/LAA thrombus or masses Previously noted mass on the aortic valve is a node of Arancius.  Normal variant and no evidence of endocarditis.Trivial TR and trivial PR Micro Data:  Respiratory culture 3/10>> GS >> Mod G+ cocci, Few G- rods>> Blood culture 3/10 CD4 3/10 Urine Culture 3/10>> No growth  Antimicrobials:  Vancomycin 3/10>>>3/16 unasyn 3/10-3/11 unasyn 3/12>>>3/16 Pen G 3/16>>>  Interim history/subjective:  Heavily sedated   Objective   Blood pressure 122/63, pulse 72, temperature 98.4 F (36.9 C), resp. rate (Abnormal) 36, height 5\' 3"  (1.6 m), weight 107.1 kg, SpO2 92 %.    Vent Mode: PCV FiO2 (%):  [50 %-80 %] 80 % Set Rate:  [22 bmp-24 bmp] 24 bmp Vt Set:  [320 mL] 320 mL PEEP:  [8 cmH20] 8 cmH20 Pressure Support:  [8 cmH20] 8 cmH20 Plateau Pressure:  [15 cmH20-24 cmH20] 15 cmH20   Intake/Output Summary (Last 24 hours) at 11/18/2019 0901 Last data filed at 11/18/2019 0800 Gross per 24 hour  Intake 4064.58 ml  Output 4920 ml  Net -855.42 ml   Filed Weights   11/16/19 0452 11/17/19 0450 11/18/19 0600  Weight: 108.3 kg 108.7 kg 107.1 kg    Examination: General heavily sedated still on full vent support.  HENT NCAT orally intubated pulm diffuse rhonchi. Currently on PSV 8/peep 8 Vts 400s, rr 30 no accessory  use Card RRR; episodes of bradycardia and junctional rhythm  abd soft not tender + bowel sounds EXT dependent edema + pulses Neuro sedated GU cl yellow   Resolved Hospital Problem list    Septic shock  Rhabdomyolysis Acute metabolic encephalopathy Elevated INR MRSA  PNA  Assessment & Plan:   ARDS/acute hypoxic and hypercarbic respiratory failure w/ MRSA PNA (completed vanc 3/16) Current every day smoker PCXR persistent bilateral patchy airspace disease w/out much improvement; ETT needs to come back 3 cm Gas exchange acceptable  Plan Cont PCV; seems more comfortable w/ this. Will pull ETT back 3 cm, this may help w/ mechanics as right at carina  Cont PAD protocol (RASS goal as below) Diuretics as below (inc lasix to 8; metolazone X 1) VAP bundle I worry she may need trach  AM CXR   Acute systolic heart failure Echo with EF 20-25% - potentially septic cardiomyopathy in setting of  Pneumococcal bacteremia->TEE confirms that this is NOT endocarditis and mass previously identified is a node of Arancius EF now 50-55% Plan abx day 8; now on PCN G per ID, stop date TBD Metolazone 10mg  VT today  DC right IJ CVL norepi on standby   Drug induced hypotension-->sedation w/ clonidine and norvasc-->resolved currently as of 3/17 Plan norepi for MAP >65 Holding clonidine and norvasc  Acute toxic and metabolic encephalopathy.  She has a history of schizophrenia & seizure disorder.home medications Abilify, Cogentin, doxepin, Neurontin and BuSpar all on her medicine profile.  -EEG - no seizure activity -sedation increased last night  Plan Cont AEDs Stopped seroquel at request of her home Pysch MD Cont buspar, doxepin, neurontin, cogentin and zoloft.  Cont Precedex and fent infusion shoot for -2 RASS Will ask night team to start cutting back   Acute renal failure: Suspect 2/2 shock-->only marginal bump since 3/16; was neg 642ml 3/16 BUT still 14 liters + Plan Avoid hypotension Renal dose meds Strict I&O q 12 hr chem w/ lasix gtt (increased from 4 to 8mg /hr)   Fluid and electrolyte imbalance: Marked Hypernatremia, intermittent hypokalemia  Plan Increase free water to 300 q 4 VT Daily chem and KCL w/ lasix gtt  HIV - well controlled Plan Cont  home ARVs    Constipation Plan  Cont LOC PRN  DM w/ hyperglycemia  Plan Cont current aspart dosing at  6 units q 4 and current SSI   Best practice:  Diet: tube feeds. Will need swallow eval if extubated Pain/Anxiety/Delirium protocol (if indicated): precedex VAP protocol (if indicated): ordered DVT prophylaxis: [facility] GI prophylaxis: PPI Glucose control: ssi Mobility: BR Code Status: full code  Family Communication: daughter udpated by phone 3/12. Patient's visitor is listed as sister but is actually just a friend. Daughter is DPOA. Disposition: to ICU  My cct 40 min    Erick Colace ACNP-BC [place] Pager # [phone] OR # [phone] if no answer

## 2019-11-19 ENCOUNTER — Inpatient Hospital Stay (HOSPITAL_COMMUNITY): Payer: Medicare Other

## 2019-11-19 DIAGNOSIS — R7881 Bacteremia: Secondary | ICD-10-CM | POA: Diagnosis not present

## 2019-11-19 DIAGNOSIS — Z9911 Dependence on respirator [ventilator] status: Secondary | ICD-10-CM | POA: Diagnosis not present

## 2019-11-19 DIAGNOSIS — B20 Human immunodeficiency virus [HIV] disease: Secondary | ICD-10-CM | POA: Diagnosis not present

## 2019-11-19 DIAGNOSIS — B953 Streptococcus pneumoniae as the cause of diseases classified elsewhere: Secondary | ICD-10-CM | POA: Diagnosis not present

## 2019-11-19 LAB — BASIC METABOLIC PANEL
Anion gap: 15 (ref 5–15)
Anion gap: 13 (ref 5–15)
BUN: 34 mg/dL — ABNORMAL HIGH (ref 6–20)
BUN: 34 mg/dL — ABNORMAL HIGH (ref 6–20)
CO2: 30 mmol/L (ref 22–32)
CO2: 31 mmol/L (ref 22–32)
Calcium: 7.4 mg/dL — ABNORMAL LOW (ref 8.9–10.3)
Calcium: 7.3 mg/dL — ABNORMAL LOW (ref 8.9–10.3)
Chloride: 98 mmol/L (ref 98–111)
Chloride: 97 mmol/L — ABNORMAL LOW (ref 98–111)
Creatinine, Ser: 2.32 mg/dL — ABNORMAL HIGH (ref 0.44–1.00)
Creatinine, Ser: 2.38 mg/dL — ABNORMAL HIGH (ref 0.44–1.00)
GFR calc Af Amer: 26 mL/min — ABNORMAL LOW (ref >60)
GFR calc Af Amer: 25 mL/min — ABNORMAL LOW (ref >60)
GFR calc non Af Amer: 23 mL/min — ABNORMAL LOW (ref >60)
GFR calc non Af Amer: 22 mL/min — ABNORMAL LOW (ref >60)
Glucose, Bld: 300 mg/dL — ABNORMAL HIGH (ref 70–99)
Glucose, Bld: 317 mg/dL — ABNORMAL HIGH (ref 70–99)
Potassium: 3.6 mmol/L (ref 3.5–5.1)
Potassium: 4 mmol/L (ref 3.5–5.1)
Sodium: 143 mmol/L (ref 135–145)
Sodium: 141 mmol/L (ref 135–145)

## 2019-11-19 LAB — CBC
HCT: 32.9 % — ABNORMAL LOW (ref 36.0–46.0)
Hemoglobin: 10.3 g/dL — ABNORMAL LOW (ref 12.0–15.0)
MCH: 27 pg (ref 26.0–34.0)
MCHC: 31.3 g/dL (ref 30.0–36.0)
MCV: 86.4 fL (ref 80.0–100.0)
Platelets: 134 10*3/uL — ABNORMAL LOW (ref 150–400)
RBC: 3.81 MIL/uL — ABNORMAL LOW (ref 3.87–5.11)
RDW: 15.7 % — ABNORMAL HIGH (ref 11.5–15.5)
WBC: 13.3 10*3/uL — ABNORMAL HIGH (ref 4.0–10.5)
nRBC: 0 % (ref 0.0–0.2)

## 2019-11-19 LAB — GLUCOSE, CAPILLARY
Glucose-Capillary: 125 mg/dL — ABNORMAL HIGH (ref 70–99)
Glucose-Capillary: 169 mg/dL — ABNORMAL HIGH (ref 70–99)
Glucose-Capillary: 175 mg/dL — ABNORMAL HIGH (ref 70–99)
Glucose-Capillary: 179 mg/dL — ABNORMAL HIGH (ref 70–99)
Glucose-Capillary: 183 mg/dL — ABNORMAL HIGH (ref 70–99)
Glucose-Capillary: 209 mg/dL — ABNORMAL HIGH (ref 70–99)

## 2019-11-19 LAB — MAGNESIUM: Magnesium: 1.8 mg/dL (ref 1.7–2.4)

## 2019-11-19 IMAGING — DX DG CHEST 1V PORT
1 series · 1 of 1 positions shown · non-contrast
Comparison: [DATE]

CLINICAL DATA: Hypoxia

EXAM:
PORTABLE CHEST 1 VIEW

[chest]
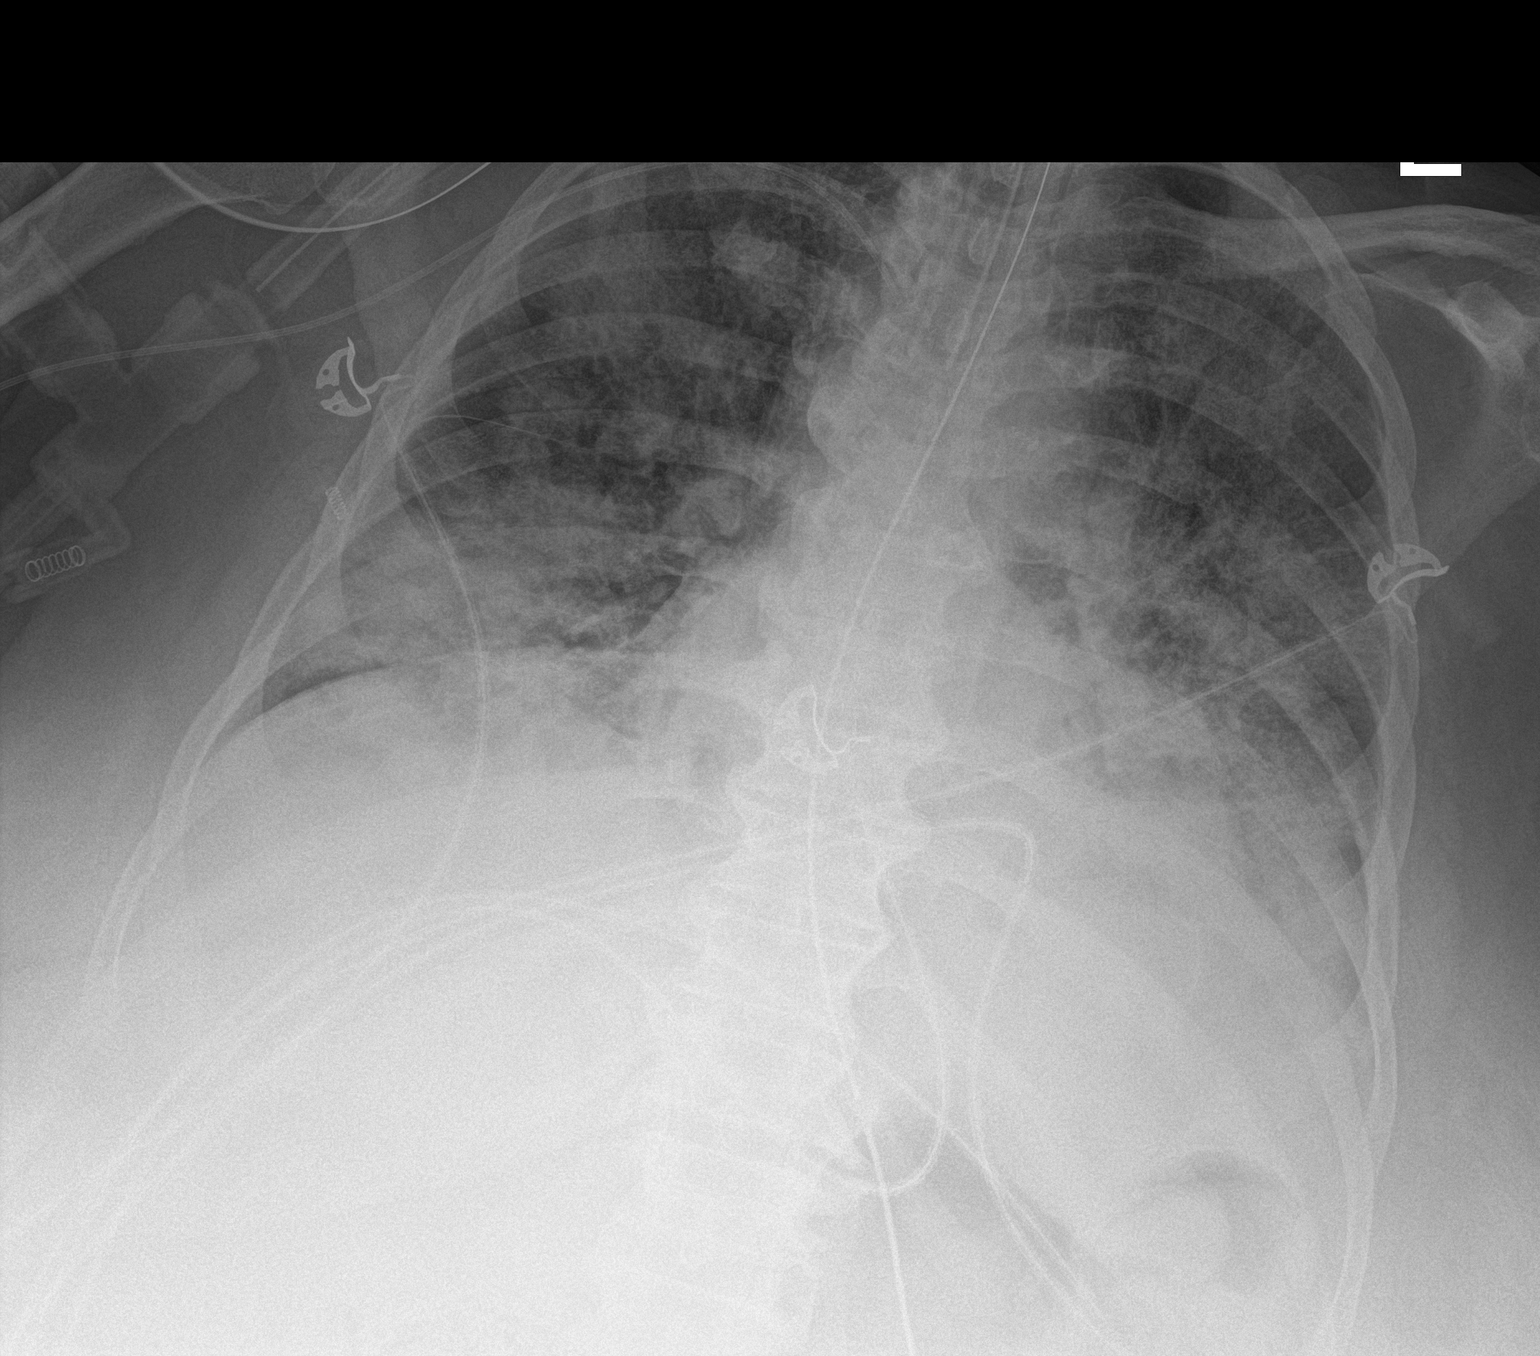

[1 of 1 positions shown; findings below may reference images not displayed]

FINDINGS: Endotracheal tube tip is 2.3 cm above the carina. Central catheter
tip is at the right subclavian vein-superior vena cava junction.
Left central catheter has been removed. Nasogastric tube tip and
side port are below the diaphragm. No pneumothorax. Airspace opacity
bilaterally is stable with consolidation in the lung bases, more on
the right than on the left, stable. Heart is upper normal in size
with pulmonary vascularity normal. No adenopathy. No bone lesions.
IMPRESSION: Tube and catheter positions as described without pneumothorax.
Stable multifocal airspace opacity. Suspect multifocal pneumonia.
Stable cardiac silhouette.

## 2019-11-19 MED ORDER — VITAL HIGH PROTEIN PO LIQD
1000.0000 mL | ORAL | Status: DC
  Administered 2019-11-19 – 2019-12-06 (×24): 1000 mL
  Filled 2019-11-19 (×7): qty 1000

## 2019-11-19 MED ORDER — FREE WATER
200.0000 mL | Status: DC
  Administered 2019-11-19 – 2019-11-23 (×24): 200 mL

## 2019-11-19 MED ORDER — POTASSIUM CHLORIDE 20 MEQ/15ML (10%) PO SOLN
40.0000 meq | Freq: Two times a day (BID) | ORAL | Status: AC
  Administered 2019-11-19 (×2): 40 meq
  Filled 2019-11-19 (×2): qty 30

## 2019-11-19 MED ORDER — WHITE PETROLATUM EX OINT
TOPICAL_OINTMENT | CUTANEOUS | Status: AC
  Administered 2019-11-19: 0.2
  Filled 2019-11-19: qty 28.35

## 2019-11-19 MED ORDER — METOLAZONE 5 MG PO TABS
10.0000 mg | ORAL_TABLET | Freq: Once | ORAL | Status: AC
  Administered 2019-11-19: 10 mg
  Filled 2019-11-19 (×2): qty 1
  Filled 2019-11-19: qty 2

## 2019-11-19 MED ORDER — INSULIN ASPART 100 UNIT/ML ~~LOC~~ SOLN
10.0000 [IU] | SUBCUTANEOUS | Status: DC
  Administered 2019-11-19 – 2019-11-20 (×6): 10 [IU] via SUBCUTANEOUS

## 2019-11-19 NOTE — Progress Notes (Signed)
Nutrition Follow-up  DOCUMENTATION CODES:   Morbid obesity  INTERVENTION:   Continue TF via OGT: Increase Vital High Protein to 60 ml/h (1440 ml per day).  Provides 1440 kcal, 126 gm protein, 1204 ml free water daily.  NUTRITION DIAGNOSIS:   Inadequate oral intake related to inability to eat as evidenced by NPO status.  Ongoing   GOAL:   Provide needs based on ASPEN/SCCM guidelines  Met with TF  MONITOR:   Vent status, TF tolerance, I & O's, Labs  REASON FOR ASSESSMENT:   Ventilator, Consult Enteral/tube feeding initiation and management  ASSESSMENT:   59 yo female admitted S/P fall at home, unresponsive, hypotensive. Intubated by EMS. Suspected overdose with toxic/metabolic encephalopathy and aspiration PNA. PMH includes schizophrenia, HIV, DM, neuropathy, HTN.  Patient continues on IV antibiotics for strep pneumo bacteremia. Diuresis ongoing. Lasix increased 3/17.  Patient remains intubated on ventilator support MV: 10 L/min Temp (24hrs), Avg:98.4 F (36.9 C), Min:97.6 F (36.4 C), Max:98.9 F (37.2 C)   Receiving TF via OG tube: Vital High Protein at 40 ml/h provding 960 kcal, 84 gm protein, 803 ml free water daily. Free water flushes 200 ml every 4 hours.  Labs reviewed. BUN 34 creat 2.32 CBG's: [phone]  Medications reviewed and include IV lasix, precedex, fentanyl, levophed, KCl, novolog, senokot.  Weight 107 kg up from 103.7 kg 3/12. I/O +12.3 L  Diet Order:   Diet Order            Diet NPO time specified  Diet effective now              EDUCATION NEEDS:   Not appropriate for education at this time  Skin:  Skin Assessment: Reviewed RN Assessment  Last BM:  PTA  Height:   Ht Readings from Last 1 Encounters:  11/17/19 5' 3"  (1.6 m)    Weight:   Wt Readings from Last 1 Encounters:  11/19/19 107 kg    Ideal Body Weight:  52.3 kg  BMI:  Body mass index is 41.79 kg/m.  Estimated Nutritional Needs:   Kcal:   1250-1600  Protein:  105-130 gm  Fluid:  >/= 1.6 L    Molli Barrows, RD, LDN, CNSC Please refer to Amion for contact information.

## 2019-11-19 NOTE — Progress Notes (Signed)
NAME:  Susan [name], MRN:  [id number], DOB:  [birth year], LOS: 8 ADMISSION DATE:  11/11/2019, CONSULTATION DATE:  3/10 REFERRING MD:  Sabra Heck, CHIEF COMPLAINT:  Acute encephalopathy and respiratory failure    Brief History   59 year old female with history of schizophrenia, HIV, diabetes with diabetic neuropathy and falls at home.  EMS called to the patient's house after what was called out as a fall.  Upon arrival the patient was unresponsive with agonal respiratory pattern.  She was intubated by EMS, hypotensive on arrival to ER.  Admitted with a working diagnosis of acute toxic/metabolic encephalopathy felt possibly secondary to overdose complicated by aspiration pneumonia and septic shock    Past Medical History  Frequent falls at home,Tobacco abuse, diabetes with painful polyneuropathy, bipolar disease, GERD, HIV, schizophrenia, history of IV drug abuse, seizure disorder.   Significant Hospital Events   3/10: EMS summoned to house.  Intubated on arrival, hypotensive on arrival to emergency room 3/15: Developed increased ventilator asynchrony, required heavy sedation and intermittent paralysis following attempts to wean most of the day 3/16 on Lasix drip.  Awaiting TEE.  Sputum growing MRSA.  Blood culture still pending for follow-up.  Coming back down on sedation, changed to [facility]. TEE showing NO EVIDENCE of endocarditis. ABX changed to pen G per ID. MRSA therapy complete. EF improved. Now up to 50% from 20-25% 3/17 still heavily sedated. Having episodes of bradycardia. Still massively overloaded (>14L), increasing lasix gtt to 8 mg from 4. Cont RASS goal -2 adding metolazone.  3/18 continuing exact regimen from 3/17 Consults:  ID Cards for TEE  Procedures:  Oral endotracheal tube 3/10 Right femoral triple-lumen catheter 3/10 (ER) right internal jugular vein triple-lumen catheter 3/10 Left IJ 3/10>>>3/17 Right UE PICC 3/16>>>  Significant Diagnostic Tests:  CT brain 3/10>>> Normal  head CT EEG 3/10>>>Moderate to severe diffuse encephalopathy. No seizures Urine drug screen 3/10>>> 3/10 acetaminophen less than 10, salicylate level less than 7 Ethanol level less than 10 11/12/2019 Echo>> EF 20-25%, LV function severely diminished, Global Hypokinesis,LV hypertrophy, Grade 1 Diastolic Dysfunction, Elevated PASP, Dilated LA, MV regurgitation, small mobile density right coronary cusp TEE 3/16: LVEF 55% No LA/LAA thrombus or masses Previously noted mass on the aortic valve is a node of Arancius.  Normal variant and no evidence of endocarditis.Trivial TR and trivial PR Micro Data:  Respiratory culture 3/10>> GS >> Mod G+ cocci, Few G- rods>> Blood culture 3/10 CD4 3/10 Urine Culture 3/10>> No growth  Antimicrobials:  Vancomycin 3/10>>>3/16 unasyn 3/10-3/11 unasyn 3/12>>>3/16 Pen G 3/16>>>  Interim history/subjective:  Slow to improve  Objective   Blood pressure (Abnormal) 86/33, pulse 74, temperature 98.9 F (37.2 C), temperature source Axillary, resp. rate (Abnormal) 27, height 5\' 3"  (1.6 m), weight 107 kg, SpO2 93 %.    Vent Mode: PCV FiO2 (%):  [70 %-80 %] 70 % Set Rate:  [24 bmp] 24 bmp PEEP:  [8 cmH20-12 cmH20] 12 cmH20 Plateau Pressure:  [17 cmH20-20 cmH20] 17 cmH20   Intake/Output Summary (Last 24 hours) at 11/19/2019 [id number] Last data filed at 11/19/2019 0800 Gross per 24 hour  Intake 4079.32 ml  Output 5300 ml  Net -1220.68 ml   Filed Weights   11/17/19 0450 11/18/19 0600 11/19/19 0424  Weight: 108.7 kg 107.1 kg 107 kg    Examination: General this is a 59 year old black female she is currently sedated on fentanyl and Precedex infusions HEENT normocephalic atraumatic no jugular venous distention appreciated she is orally intubated Pulmonary: Expiratory wheezing, equal  chest rise bilaterally.  Currently on 70% FiO2 with PEEP of 12.  Tidal volume ranging in the mid 300s to 400 cc no accessory use on this setting saturations in the high 80s to low  90s Cardiac: Regular rate and rhythm without murmur rub or gallop Abdomen: Soft nontender no organomegaly Extremities: Warm dry dependent edema, pulses are strong Neuro: Heavily sedated GU clear yellow urine  Resolved Hospital Problem list    Septic shock  Rhabdomyolysis Acute metabolic encephalopathy Elevated INR MRSA PNA  Assessment & Plan:   ARDS/acute hypoxic and hypercarbic respiratory failure w/ MRSA PNA (completed vanc 3/16) Current every day smoker Portable chest x-ray personally reviewed: Endotracheal tubes in satisfactory position as is the right PICC line.  Comparing serial films she continues to have bilateral patchy infiltrates with right greater than left consolidative changes there may be somewhat improved aeration today when comparing serial films Plan Continue pressure control ventilation, I have uptitrated PEEP to improve recruitment, and decrease FiO2 needs Continue PAD protocol with RASS goal -2 to -3 Continuing aggressive diuretic regimen VAP bundle A.m. chest x-ray  History of asthma  Plan  Continue budesonide and DuoNeb  acute systolic heart failure Echo with EF 20-25% - potentially septic cardiomyopathy in setting of  Pneumococcal bacteremia->TEE confirms that this is NOT endocarditis and mass previously identified is a node of Arancius EF now 50-55% Plan Antibiotic day #9 abx , To complete total of 2 weeks therapy now on penicillin G Repeat metolazone Norepinephrine as needed Continue Lasix Continue telemetry Holding beta-blockade given hypotension  Drug induced hypotension-->sedation w/ clonidine and norvasc-->resolved currently as of 3/17 Plan Norepinephrine as needed for mean arterial pressure greater than 65  We will continue to hold clonidine and Norvasc   Acute toxic and metabolic encephalopathy.  She has a history of schizophrenia & seizure disorder.home medications Abilify, Cogentin, doxepin, Neurontin and BuSpar all on her medicine  profile.  -EEG - no seizure activity -sedation increased last night  Plan Continue AEDs  Seroquel was stopped per her request of her home psychiatric physician  Continue BuSpar, doxepin, Neurontin, Cogentin, and Zoloft  Continuing Precedex and fentanyl infusion, she is not ready to wean as of yet, we can continue a -2 to -3 goal for her rash  Hopefully we can initiate more aggressive weaning of sedation on 3/19    Acute renal failure: Suspect 2/2 shock--> he remains 13 L positive but we did get 1.3 L off yesterday.  Her creatinine is fairly stable holding at  2.32 plan Avoid hypotension  Renal dose medication  Strict intake output  We will continue Lasix at 8 mg an hour for now    Fluid and electrolyte imbalance: Marked Hypernatremia, intermittent hypokalemia  Plan We will continue free water replacement, can back down to 200 mL every 4 hours via tube  Continue twice daily laboratory checks well on Lasix drip Empirically treat hypokalemia  HIV - well controlled Plan Continuing her home antiretroviral regimen   Constipation Plan  Laxative of choice as needed  DM w/ hyperglycemia  Plan Continue sliding scale insulin, will increase her scheduled aspart to 10 units every 4 hours     Best practice:  Diet: tube feeds. Will need swallow eval if extubated Pain/Anxiety/Delirium protocol (if indicated): precedex VAP protocol (if indicated): ordered DVT prophylaxis: [facility] GI prophylaxis: PPI Glucose control: ssi Mobility: BR Code Status: full code  Family Communication: daughter udpated by phone 3/12. Patient's visitor is listed as sister but is actually just a  friend. Daughter is DPOA. Disposition: to ICU  My cct 32 min    Erick Colace ACNP-BC [place] Pager # [phone] OR # [phone] if no answer

## 2019-11-19 NOTE — Discharge Instructions (Signed)
Community-Acquired Pneumonia, Adult Pneumonia is an infection of the lungs. It causes swelling in the airways of the lungs. Mucus and fluid may also build up inside the airways. One type of pneumonia can happen while a person is in a hospital. A different type can happen when a person is not in a hospital (community-acquired pneumonia).  What are the causes?  This condition is caused by germs (viruses, bacteria, or fungi). Some types of germs can be passed from one person to another. This can happen when you breathe in droplets from the cough or sneeze of an infected person. What increases the risk? You are more likely to develop this condition if you:  Have a long-term (chronic) disease, such as: ? Chronic obstructive pulmonary disease (COPD). ? Asthma. ? Cystic fibrosis. ? Congestive heart failure. ? Diabetes. ? Kidney disease.  Have HIV.  Have sickle cell disease.  Have had your spleen removed.  Do not take good care of your teeth and mouth (poor dental hygiene).  Have a medical condition that increases the risk of breathing in droplets from your own mouth and nose.  Have a weakened body defense system (immune system).  Are a smoker.  Travel to areas where the germs that cause this illness are common.  Are around certain animals or the places they live. What are the signs or symptoms?  A dry cough.  A wet (productive) cough.  Fever.  Sweating.  Chest pain. This often happens when breathing deeply or coughing.  Fast breathing or trouble breathing.  Shortness of breath.  Shaking chills.  Feeling tired (fatigue).  Muscle aches. How is this treated? Treatment for this condition depends on many things. Most adults can be treated at home. In some cases, treatment must happen in a hospital. Treatment may include:  Medicines given by mouth or through an IV tube.  Being given extra oxygen.  Respiratory therapy. In rare cases, treatment for very bad pneumonia  may include:  Using a machine to help you breathe.  Having a procedure to remove fluid from around your lungs. Follow these instructions at home: Medicines  Take over-the-counter and prescription medicines only as told by your doctor. ? Only take cough medicine if you are losing sleep.  If you were prescribed an antibiotic medicine, take it as told by your doctor. Do not stop taking the antibiotic even if you start to feel better. General instructions   Sleep with your head and neck raised (elevated). You can do this by sleeping in a recliner or by putting a few pillows under your head.  Rest as needed. Get at least 8 hours of sleep each night.  Drink enough water to keep your pee (urine) pale yellow.  Eat a healthy diet that includes plenty of vegetables, fruits, whole grains, low-fat dairy products, and lean protein.  Do not use any products that contain nicotine or tobacco. These include cigarettes, e-cigarettes, and chewing tobacco. If you need help quitting, ask your doctor.  Keep all follow-up visits as told by your doctor. This is important. How is this prevented? A shot (vaccine) can help prevent pneumonia. Shots are often suggested for:  People older than 58 years of age.  People older than 58 years of age who: ? Are having cancer treatment. ? Have long-term (chronic) lung disease. ? Have problems with their body's defense system. You may also prevent pneumonia if you take these actions:  Get the flu (influenza) shot every year.  Go to the dentist as   often as told.  Wash your hands often. If you cannot use soap and water, use hand sanitizer. Contact a doctor if:  You have a fever.  You lose sleep because your cough medicine does not help. Get help right away if:  You are short of breath and it gets worse.  You have more chest pain.  Your sickness gets worse. This is very serious if: ? You are an older adult. ? Your body's defense system is weak.  You  cough up blood. Summary  Pneumonia is an infection of the lungs.  Most adults can be treated at home. Some will need treatment in a hospital.  Drink enough water to keep your pee pale yellow.  Get at least 8 hours of sleep each night. This information is not intended to replace advice given to you by your health care provider. Make sure you discuss any questions you have with your health care provider. Document Revised: 12/10/2018 Document Reviewed: 04/17/2018 Elsevier Patient Education  2020 [employer]vier Inc.  

## 2019-11-19 NOTE — Progress Notes (Signed)
Susan [name] for Infectious Disease  Date of Admission:  11/11/2019     Total days of antibiotics 9         ASSESSMENT:  Susan [name] is on Day 9 of antibiotic therapy with goal of 14 days with penicillin for Strep pneumo bacteremia. Remains on ventilatory support with intermittent need for vasopressor support. CCM working on diuresing at present. She has remained afebrile and is tolerating penicillin with no adverse side effects. Continue current dose of penicillin. Stable with Tivicay and Descovy for ART.   PLAN:  1. Continue penicillin through 3/23. 2. Tivicay and Descovy for well controlled HIV disease. 3. Diruesis and ventilator management per CCM.   Principal Problem:   Acute respiratory failure with hypoxemia (HCC) Active Problems:   Pneumococcal bacteremia   HIV disease (HCC)   Drug-induced hypotension   AKI (acute kidney injury) ([place])   Pneumonia   Aortic valve endocarditis   Acute kidney injury (AKI) with acute tubular necrosis (ATN) (HCC)   Acute metabolic encephalopathy   . benztropine  0.5 mg Per Tube Daily  . budesonide (PULMICORT) nebulizer solution  0.5 mg Nebulization BID  . busPIRone  15 mg Per Tube TID  . chlorhexidine gluconate (MEDLINE KIT)  15 mL Mouth Rinse BID  . Chlorhexidine Gluconate Cloth  6 each Topical Daily  . dolutegravir  50 mg Oral Daily  . doxepin  50 mg Per Tube QHS  . emtricitabine-tenofovir AF  1 tablet Per Tube Daily  . feeding supplement (VITAL HIGH PROTEIN)  1,000 mL Per Tube Q24H  . free water  200 mL Per Tube Q4H  . gabapentin  300 mg Per Tube Q8H  . heparin injection (subcutaneous)  5,000 Units Subcutaneous Q8H  . insulin aspart  0-15 Units Subcutaneous Q4H  . insulin aspart  10 Units Subcutaneous Q4H  . ipratropium-albuterol  3 mL Nebulization QID  . mouth rinse  15 mL Mouth Rinse 10 times per day  . metolazone  10 mg Per Tube Once  . pantoprazole sodium  40 mg Per Tube Daily  . potassium chloride  40 mEq Per Tube BID    . rosuvastatin  20 mg Per Tube q1800  . sennosides  15 mL Per Tube BID  . sertraline  50 mg Per Tube Daily    SUBJECTIVE:  Afebrile overnight with no acute events. Currently being diuresed and on mild sedation with Precedex and Fentanyl. She is requiring intermittent vasopressor support.   Allergies  Allergen Reactions  . Lyrica [Pregabalin] Swelling    Has LE swelling     Review of Systems: Review of Systems  Unable to perform ROS: Intubated    OBJECTIVE: Vitals:   11/19/19 0930 11/19/19 0945 11/19/19 1000 11/19/19 1015  BP: (!) 104/43 (!) 107/44 (!) 102/44 (!) 103/46  Pulse: 76 73 74 74  Resp: (!) 27 (!) 28 (!) 23 (!) 28  Temp:      TempSrc:      SpO2: 92% 94% 93% 94%  Weight:      Height:       Body mass index is 41.79 kg/m.  Physical Exam Constitutional:      General: She is not in acute distress.    Appearance: She is well-developed. She is ill-appearing.     Interventions: She is intubated and restrained.     Comments: Lying I bed with head of bed elevated; sedated.   Cardiovascular:     Rate and Rhythm: Normal rate and regular rhythm.  Heart sounds: Normal heart sounds.  Pulmonary:     Effort: Pulmonary effort is normal. She is intubated.     Breath sounds: Normal breath sounds.  Skin:    General: Skin is warm and dry.  Neurological:     Mental Status: She is oriented to person, place, and time.  Psychiatric:        Behavior: Behavior normal.        Thought Content: Thought content normal.        Judgment: Judgment normal.     Lab Results Lab Results  Component Value Date   WBC 13.3 (H) 11/19/2019   HGB 10.3 (L) 11/19/2019   HCT 32.9 (L) 11/19/2019   MCV 86.4 11/19/2019   PLT 134 (L) 11/19/2019    Lab Results  Component Value Date   CREATININE 2.32 (H) 11/19/2019   BUN 34 (H) 11/19/2019   NA 143 11/19/2019   K 3.6 11/19/2019   CL 98 11/19/2019   CO2 30 11/19/2019    Lab Results  Component Value Date   ALT 18 11/14/2019   AST  42 (H) 11/14/2019   ALKPHOS 103 11/14/2019   BILITOT 0.6 11/14/2019     Microbiology: Recent Results (from the past 240 hour(s))  Culture, blood (Routine x 2)     Status: Abnormal   Collection Time: 11/11/19  2:51 PM   Specimen: BLOOD  Result Value Ref Range Status   Specimen Description BLOOD CENTRAL LINE  Final   Special Requests   Final    BOTTLES DRAWN AEROBIC AND ANAEROBIC Blood Culture adequate volume   Culture  Setup Time   Final    GRAM POSITIVE COCCI IN CHAINS AEROBIC BOTTLE ONLY CRITICAL RESULT CALLED TO, READ BACK BY AND VERIFIED WITH: Salli Real [id number] Performed at [facility], [place] [address], [place],  [address]    Culture STREPTOCOCCUS PNEUMONIAE (A)  Final   Report Status 11/14/2019 FINAL  Final   Organism ID, Bacteria STREPTOCOCCUS PNEUMONIAE  Final      Susceptibility   Streptococcus pneumoniae - MIC*    ERYTHROMYCIN <=0.12 SENSITIVE Sensitive     LEVOFLOXACIN 0.5 SENSITIVE Sensitive     VANCOMYCIN <=0.12 SENSITIVE Sensitive     PENICILLIN (meningitis) <=0.06 SENSITIVE Sensitive     PENO - penicillin <=0.06      PENICILLIN (non-meningitis) <=0.06 SENSITIVE Sensitive     PENICILLIN (oral) <=0.06 SENSITIVE Sensitive     CEFTRIAXONE (non-meningitis) <=0.12 SENSITIVE Sensitive     CEFTRIAXONE (meningitis) <=0.12 SENSITIVE Sensitive     * STREPTOCOCCUS PNEUMONIAE  Culture, blood (Routine x 2)     Status: None   Collection Time: 11/11/19  2:56 PM   Specimen: BLOOD RIGHT HAND  Result Value Ref Range Status   Specimen Description BLOOD RIGHT HAND  Final   Special Requests   Final    BOTTLES DRAWN AEROBIC ONLY Blood Culture results may not be optimal due to an inadequate volume of blood received in culture bottles   Culture NO GROWTH 5 DAYS  Final   Report Status 11/16/2019 FINAL  Final  Urine culture     Status: None   Collection Time: 11/11/19  3:11 PM   Specimen: Urine, Catheterized  Result Value Ref Range Status   Specimen  Description URINE, CATHETERIZED  Final   Special Requests NONE  Final   Culture   Final    NO GROWTH Performed at [facility], [address], [place], [place]  [address]    Report Status 11/12/2019 FINAL  Final  Respiratory Panel by RT PCR (Flu A&B, Covid) - Nasopharyngeal Swab     Status: None   Collection Time: 11/11/19  3:13 PM   Specimen: Nasopharyngeal Swab  Result Value Ref Range Status   SARS Coronavirus 2 by RT PCR NEGATIVE NEGATIVE Final    Comment: (NOTE) SARS-CoV-2 target nucleic acids are NOT DETECTED. The SARS-CoV-2 RNA is generally detectable in upper respiratoy specimens during the acute phase of infection. The lowest concentration of SARS-CoV-2 viral copies this assay can detect is 131 copies/mL. A negative result does not preclude SARS-Cov-2 infection and should not be used as the sole basis for treatment or other patient management decisions. A negative result may occur with  improper specimen collection/handling, submission of specimen other than nasopharyngeal swab, presence of viral mutation(s) within the areas targeted by this assay, and inadequate number of viral copies (<131 copies/mL). A negative result must be combined with clinical observations, patient history, and epidemiological information. The expected result is Negative. Fact Sheet for Patients:  PinkCheek.be Fact Sheet for Healthcare Providers:  GravelBags.it This test is not yet ap proved or cleared by the [place] FDA and  has been authorized for detection and/or diagnosis of SARS-CoV-2 by FDA under an Emergency Use Authorization (EUA). This EUA will remain  in effect (meaning this test can be used) for the duration of the COVID-19 declaration under Section 564(b)(1) of the Act, 21 U.S.C. section 360bbb-3(b)(1), unless the authorization is terminated or revoked sooner.    Influenza A by PCR NEGATIVE NEGATIVE Final    Influenza B by PCR NEGATIVE NEGATIVE Final    Comment: (NOTE) The Xpert Xpress SARS-CoV-2/FLU/RSV assay is intended as an aid in  the diagnosis of influenza from Nasopharyngeal swab specimens and  should not be used as a sole basis for treatment. Nasal washings and  aspirates are unacceptable for Xpert Xpress SARS-CoV-2/FLU/RSV  testing. Fact Sheet for Patients: PinkCheek.be Fact Sheet for Healthcare Providers: GravelBags.it This test is not yet approved or cleared by the [place] FDA and  has been authorized for detection and/or diagnosis of SARS-CoV-2 by  FDA under an Emergency Use Authorization (EUA). This EUA will remain  in effect (meaning this test can be used) for the duration of the  Covid-19 declaration under Section 564(b)(1) of the Act, 21  U.S.C. section 360bbb-3(b)(1), unless the authorization is  terminated or revoked. Performed at [facility], [place] [address] [place], [place] [address]   MRSA PCR Screening     Status: Abnormal   Collection Time: 11/11/19  8:23 PM   Specimen: Nasal Mucosa; Nasopharyngeal  Result Value Ref Range Status   MRSA by PCR POSITIVE (A) NEGATIVE Final    Comment: CRITICAL RESULT CALLED TO, READ BACK BY AND VERIFIED WITH: RN Cindee Salt [id number] [id number]  Delight Stare Performed at [facility], [place] [address] [place], [place] [address]   Culture, respiratory (non-expectorated)     Status: None   Collection Time: 11/12/19  6:31 AM   Specimen: Tracheal Aspirate; Respiratory  Result Value Ref Range Status   Specimen Description TRACHEAL ASPIRATE  Final   Special Requests NONE  Final   Gram Stain   Final    FEW WBC PRESENT, PREDOMINANTLY PMN MODERATE GRAM POSITIVE COCCI FEW GRAM NEGATIVE RODS Performed at [facility], [place] [address] [place], [place] [address]    Culture   Final    RARE METHICILLIN RESISTANT STAPHYLOCOCCUS AUREUS  RARE CANDIDA ALBICANS    Report Status  11/16/2019 FINAL  Final   Organism ID, Bacteria METHICILLIN RESISTANT STAPHYLOCOCCUS AUREUS  Final      Susceptibility   Methicillin resistant staphylococcus aureus - MIC*    CIPROFLOXACIN >=8 RESISTANT Resistant     ERYTHROMYCIN >=8 RESISTANT Resistant     GENTAMICIN <=0.5 SENSITIVE Sensitive     OXACILLIN >=4 RESISTANT Resistant     TETRACYCLINE <=1 SENSITIVE Sensitive     VANCOMYCIN <=0.5 SENSITIVE Sensitive     TRIMETH/SULFA <=10 SENSITIVE Sensitive     CLINDAMYCIN <=0.25 SENSITIVE Sensitive     RIFAMPIN <=0.5 SENSITIVE Sensitive     Inducible Clindamycin NEGATIVE Sensitive     * RARE METHICILLIN RESISTANT STAPHYLOCOCCUS AUREUS  Culture, blood (routine x 2)     Status: None   Collection Time: 11/13/19  6:21 PM   Specimen: BLOOD LEFT HAND  Result Value Ref Range Status   Specimen Description BLOOD LEFT HAND  Final   Special Requests   Final    BOTTLES DRAWN AEROBIC ONLY Blood Culture adequate volume   Culture   Final    NO GROWTH 5 DAYS Performed at [facility], [address] [place], [place] [address]    Report Status 11/18/2019 FINAL  Final  Culture, blood (routine x 2)     Status: None   Collection Time: 11/13/19  6:31 PM   Specimen: BLOOD LEFT HAND  Result Value Ref Range Status   Specimen Description BLOOD LEFT HAND  Final   Special Requests   Final    BOTTLES DRAWN AEROBIC ONLY Blood Culture adequate volume   Culture   Final    NO GROWTH 5 DAYS Performed at [facility], [place] [address] [place], [place] [address]    Report Status 11/18/2019 FINAL  Final     Terri Piedra, NP [place] [facility]   11/19/2019  10:33 AM

## 2019-11-20 DIAGNOSIS — N170 Acute kidney failure with tubular necrosis: Secondary | ICD-10-CM | POA: Diagnosis not present

## 2019-11-20 DIAGNOSIS — G9341 Metabolic encephalopathy: Secondary | ICD-10-CM | POA: Diagnosis not present

## 2019-11-20 DIAGNOSIS — R7881 Bacteremia: Secondary | ICD-10-CM | POA: Diagnosis not present

## 2019-11-20 DIAGNOSIS — J9601 Acute respiratory failure with hypoxia: Secondary | ICD-10-CM | POA: Diagnosis not present

## 2019-11-20 LAB — BASIC METABOLIC PANEL
Anion gap: 15 (ref 5–15)
Anion gap: 15 (ref 5–15)
BUN: 39 mg/dL — ABNORMAL HIGH (ref 6–20)
BUN: 37 mg/dL — ABNORMAL HIGH (ref 6–20)
CO2: 35 mmol/L — ABNORMAL HIGH (ref 22–32)
CO2: 35 mmol/L — ABNORMAL HIGH (ref 22–32)
Calcium: 8 mg/dL — ABNORMAL LOW (ref 8.9–10.3)
Calcium: 7.9 mg/dL — ABNORMAL LOW (ref 8.9–10.3)
Chloride: 94 mmol/L — ABNORMAL LOW (ref 98–111)
Chloride: 88 mmol/L — ABNORMAL LOW (ref 98–111)
Creatinine, Ser: 2.63 mg/dL — ABNORMAL HIGH (ref 0.44–1.00)
Creatinine, Ser: 2.54 mg/dL — ABNORMAL HIGH (ref 0.44–1.00)
GFR calc Af Amer: 22 mL/min — ABNORMAL LOW (ref >60)
GFR calc Af Amer: 23 mL/min — ABNORMAL LOW (ref >60)
GFR calc non Af Amer: 19 mL/min — ABNORMAL LOW (ref >60)
GFR calc non Af Amer: 20 mL/min — ABNORMAL LOW (ref >60)
Glucose, Bld: 183 mg/dL — ABNORMAL HIGH (ref 70–99)
Glucose, Bld: 282 mg/dL — ABNORMAL HIGH (ref 70–99)
Potassium: 3.5 mmol/L (ref 3.5–5.1)
Potassium: 4.1 mmol/L (ref 3.5–5.1)
Sodium: 144 mmol/L (ref 135–145)
Sodium: 138 mmol/L (ref 135–145)

## 2019-11-20 LAB — GLUCOSE, CAPILLARY
Glucose-Capillary: 168 mg/dL — ABNORMAL HIGH (ref 70–99)
Glucose-Capillary: 213 mg/dL — ABNORMAL HIGH (ref 70–99)
Glucose-Capillary: 204 mg/dL — ABNORMAL HIGH (ref 70–99)
Glucose-Capillary: 173 mg/dL — ABNORMAL HIGH (ref 70–99)
Glucose-Capillary: 158 mg/dL — ABNORMAL HIGH (ref 70–99)

## 2019-11-20 LAB — CBC
HCT: 35.8 % — ABNORMAL LOW (ref 36.0–46.0)
Hemoglobin: 11.2 g/dL — ABNORMAL LOW (ref 12.0–15.0)
MCH: 27.5 pg (ref 26.0–34.0)
MCHC: 31.3 g/dL (ref 30.0–36.0)
MCV: 87.7 fL (ref 80.0–100.0)
Platelets: 174 10*3/uL (ref 150–400)
RBC: 4.08 MIL/uL (ref 3.87–5.11)
RDW: 15.7 % — ABNORMAL HIGH (ref 11.5–15.5)
WBC: 16 10*3/uL — ABNORMAL HIGH (ref 4.0–10.5)
nRBC: 0 % (ref 0.0–0.2)

## 2019-11-20 MED ORDER — POTASSIUM CHLORIDE 20 MEQ/15ML (10%) PO SOLN
40.0000 meq | Freq: Two times a day (BID) | ORAL | Status: AC
  Administered 2019-11-20 (×2): 40 meq
  Filled 2019-11-20 (×2): qty 30

## 2019-11-20 MED ORDER — SODIUM CHLORIDE 0.9 % IV SOLN: INTRAVENOUS | Status: DC

## 2019-11-20 MED ORDER — NOREPINEPHRINE 16 MG/250ML-% IV SOLN
0.0000 ug/min | INTRAVENOUS | Status: DC
  Administered 2019-11-20: 8 ug/min via INTRAVENOUS
  Filled 2019-11-20 (×2): qty 250

## 2019-11-20 MED ORDER — INSULIN GLARGINE 100 UNIT/ML ~~LOC~~ SOLN
8.0000 [IU] | Freq: Two times a day (BID) | SUBCUTANEOUS | Status: DC
  Administered 2019-11-20 – 2019-11-21 (×3): 8 [IU] via SUBCUTANEOUS
  Filled 2019-11-20 (×4): qty 0.08

## 2019-11-20 MED ORDER — INSULIN ASPART 100 UNIT/ML ~~LOC~~ SOLN
12.0000 [IU] | SUBCUTANEOUS | Status: DC
  Administered 2019-11-20 – 2019-11-24 (×23): 12 [IU] via SUBCUTANEOUS

## 2019-11-20 MED ORDER — MAGNESIUM SULFATE 4 GM/100ML IV SOLN
4.0000 g | Freq: Once | INTRAVENOUS | Status: AC
  Administered 2019-11-20: 4 g via INTRAVENOUS
  Filled 2019-11-20: qty 100

## 2019-11-20 NOTE — Progress Notes (Signed)
NAME:  Susan [name], MRN:  [id number], DOB:  June 20, 1961, LOS: 9 ADMISSION DATE:  11/11/2019, CONSULTATION DATE:  3/10 REFERRING MD:  Sabra Heck, CHIEF COMPLAINT:  Acute encephalopathy and respiratory failure    Brief History   59 year old female with history of schizophrenia, HIV, diabetes with diabetic neuropathy and falls at home.  EMS called to the patient's house after what was called out as a fall.  Upon arrival the patient was unresponsive with agonal respiratory pattern.  She was intubated by EMS, hypotensive on arrival to ER.  Admitted with a working diagnosis of acute toxic/metabolic encephalopathy felt possibly secondary to overdose complicated by aspiration pneumonia and septic shock    Past Medical History  Frequent falls at home,Tobacco abuse, diabetes with painful polyneuropathy, bipolar disease, GERD, HIV, schizophrenia, history of IV drug abuse, seizure disorder.   Significant Hospital Events   3/10: EMS summoned to house.  Intubated on arrival, hypotensive on arrival to emergency room 3/15: Developed increased ventilator asynchrony, required heavy sedation and intermittent paralysis following attempts to wean most of the day 3/16 on Lasix drip.  Awaiting TEE.  Sputum growing MRSA.  Blood culture still pending for follow-up.  Coming back down on sedation, changed to [facility]. TEE showing NO EVIDENCE of endocarditis. ABX changed to pen G per ID. MRSA therapy complete. EF improved. Now up to 50% from 20-25% 3/17 still heavily sedated. Having episodes of bradycardia. Still massively overloaded (>14L), increasing lasix gtt to 8 mg from 4. Cont RASS goal -2 adding metolazone.  3/18 continuing exact regimen from 3/17 319: Changing Lasix to 40 every 12, changing RASS goal to -2, decrease in fentanyl, decreasing PEEP, anticipating trialing pressure support ventilation.  Watching upward trend of white blood cell count and low-grade fever Consults:  ID Cards for TEE  Procedures:  Oral  endotracheal tube 3/10 Right femoral triple-lumen catheter 3/10 (ER) right internal jugular vein triple-lumen catheter 3/10 Left IJ 3/10>>>3/17 Right UE PICC 3/16>>>  Significant Diagnostic Tests:  CT brain 3/10>>> Normal head CT EEG 3/10>>>Moderate to severe diffuse encephalopathy. No seizures Urine drug screen 3/10>>> 3/10 acetaminophen less than 10, salicylate level less than 7 Ethanol level less than 10 11/12/2019 Echo>> EF 20-25%, LV function severely diminished, Global Hypokinesis,LV hypertrophy, Grade 1 Diastolic Dysfunction, Elevated PASP, Dilated LA, MV regurgitation, small mobile density right coronary cusp TEE 3/16: LVEF 55% No LA/LAA thrombus or masses Previously noted mass on the aortic valve is a node of Arancius.  Normal variant and no evidence of endocarditis.Trivial TR and trivial PR Micro Data:  Respiratory culture 3/10>> GS >> Mod G+ cocci, Few G- rods>> Blood culture 3/10 CD4 3/10 Urine Culture 3/10>> No growth  Antimicrobials:  Vancomycin 3/10>>>3/16 unasyn 3/10-3/11 unasyn 3/12>>>3/16 Pen G 3/16>>>  Interim history/subjective:  Slowly improving, looks like we can start weaning PEEP and FiO2  Objective   Blood pressure (Abnormal) 106/45, pulse 77, temperature 99.2 F (37.3 C), temperature source Axillary, resp. rate 14, height 5\' 3"  (1.6 m), weight 106 kg, SpO2 98 %.    Vent Mode: PCV FiO2 (%):  [60 %-80 %] 80 % Set Rate:  [24 bmp] 24 bmp PEEP:  [12 cmH20] 12 cmH20 Plateau Pressure:  [15 cmH20-31 cmH20] 18 cmH20   Intake/Output Summary (Last 24 hours) at 11/20/2019 0857 Last data filed at 11/20/2019 0700 Gross per 24 hour  Intake 4983.48 ml  Output 5305 ml  Net -321.52 ml   Filed Weights   11/18/19 0600 11/19/19 0424 11/20/19 0438  Weight: 107.1 kg  107 kg 106 kg    Examination: General: 59 year old black female she is currently sedated on Precedex and fentanyl infusion. HEENT normocephalic atraumatic no jugular venous distention is appreciated  orally intubated Pulmonary: Scattered rhonchi, equal chest rise.  Able to pull 600 mL tidal volume on pressure support of 8, and PEEP of 10, however sedated and develops episodes of apnea when not stimulated Cardiac: Regular irregular with frequent PACs noted on telemetry today Abdomen soft not tender no organomegaly tolerating tube feeds Extremities warm and dry some dependent edema persists, pulses are palpable Neuro, starting to respond once again to verbal stimulus, not following commands GU clear yellow urine  Resolved Hospital Problem list    Septic shock  Rhabdomyolysis Acute metabolic encephalopathy Elevated INR MRSA PNA  Assessment & Plan:   ARDS/acute hypoxic and hypercarbic respiratory failure w/ MRSA PNA (completed vanc 3/16) Current every day smoker Ventilator mechanics improving.  Weaning PEEP and FiO2, she is still over 12 L positive Plan Continue pressure control ventilation, decreasing RASS goal to -1 to -2 and hope to initiate pressure support trials later today  Continue PAD protocol  Continue VAP bundle  Continue to wean PEEP and FiO2  Continue aggressive diuresis , Noting her creatinine has climbed slowly over the course of the week.  See below A.m. chest x-ray  History of asthma  Plan  Continue budesonide and DuoNeb  acute systolic heart failure Echo with EF 20-25% - potentially septic cardiomyopathy in setting of  Pneumococcal bacteremia->TEE confirms that this is NOT endocarditis and mass previously identified is a node of Arancius EF now 50-55% Plan Antibiotic day #10, plan to complete 2 weeks total of antibiotic, currently on penicillin G stop date 3/23 Hold metolazone today Change IV Lasix to 40 every 12 Continue to titrate norepinephrine for mean arterial pressure greater than 65 Hold beta-blockade given hypotension Continue telemetry monitoring   Drug induced hypotension-->sedation w/ clonidine and norvasc-->resolved currently as of  3/17 Plan Holding clonidine and Norvasc Continue norepinephrine  Leukocytosis, low-grade temp. Plan Continue to trend fever curve, may need to reculture sputum  Acute toxic and metabolic encephalopathy.  She has a history of schizophrenia & seizure disorder.home medications Abilify, Cogentin, doxepin, Neurontin and BuSpar all on her medicine profile.  -EEG - no seizure activity -sedation increased last night  Plan Continue AEDs Seroquel stopped, this was a suggestion from her home psychiatric clinician Continue BuSpar, doxepin, Neurontin, Cogentin and Zoloft Continue Precedex, but begin to taper fentanyl with goal RASS -2 Continue supportive care    Acute renal failure: Suspect 2/2 shock--> for fluid volume is now a -12 L this is improving slowly however her creatinine is climbing  plan Decreasing diuresis today, holding metolazone  Continue strict intake output  Renal dose medications when appropriate  Avoid hypotension  Close trending of chemistry   Fluid and electrolyte imbalance: Marked Hypernatremia, intermittent hypokalemia  Plan Continue free water at 200 every 4 hours  Replace potassium  Replace magnesium, this was not replaced on 3/18  Recheck labs post replacement    HIV - well controlled Plan Antiretroviral regimen   Constipation Plan  Laxatives replace as needed DM w/ hyperglycemia  Plan Continue sliding scale insulin, increase every 4 hour aspart to 12 units, add Lantus 8 units every 12      Best practice:  Diet: tube feeds. Will need swallow eval if extubated Pain/Anxiety/Delirium protocol (if indicated): precedex VAP protocol (if indicated): ordered DVT prophylaxis: [facility] GI prophylaxis: PPI Glucose control: ssi Mobility:  BR Code Status: full code  Family Communication: daughter udpated by phone 3/12. Patient's visitor is listed as sister but is actually just a friend. Daughter is DPOA. Disposition: to ICU  My cct 34 min  Erick Colace  ACNP-BC [place] Pager # [phone] OR # [phone] if no answer

## 2019-11-20 NOTE — Progress Notes (Signed)
Inpatient Diabetes Program Recommendations  AACE/ADA: New Consensus Statement on Inpatient Glycemic Control (2015)  Target Ranges:  Prepandial:   less than 140 mg/dL      Peak postprandial:   less than 180 mg/dL (1-2 hours)      Critically ill patients:  140 - 180 mg/dL   Lab Results  Component Value Date   GLUCAP 213 (H) 11/20/2019   HGBA1C 12.3 (H) 11/11/2019    Review of Glycemic Control Results for JAILEY, BISSEN (MRN [id number]) as of 11/20/2019 09:15  Ref. Range 11/19/2019 23:35 11/20/2019 03:40 11/20/2019 07:57  Glucose-Capillary Latest Ref Range: 70 - 99 mg/dL 179 (H) 168 (H) 213 (H)    Diabetes history: DM2 Outpatient Diabetes medications: NPH 130 units QD  Current orders for Inpatient glycemic control: Novolog 0-15 Q4H + Novolog 10 units Q4H vital @ 3ml/hr   Inpatient Diabetes Program Recommendations:   Given increase to tube feed coverage, may want to increase tube feed coverage to Novolog 12 units Q4H (to be stopped or held in the event tube feeds are stopped).   Thanks, Bronson Curb, MSN, RNC-OB Diabetes Coordinator [phone] (8a-5p)

## 2019-11-21 ENCOUNTER — Inpatient Hospital Stay (HOSPITAL_COMMUNITY): Payer: Medicare Other

## 2019-11-21 DIAGNOSIS — B20 Human immunodeficiency virus [HIV] disease: Secondary | ICD-10-CM | POA: Diagnosis not present

## 2019-11-21 DIAGNOSIS — R7881 Bacteremia: Secondary | ICD-10-CM | POA: Diagnosis not present

## 2019-11-21 DIAGNOSIS — J9601 Acute respiratory failure with hypoxia: Secondary | ICD-10-CM | POA: Diagnosis not present

## 2019-11-21 DIAGNOSIS — B953 Streptococcus pneumoniae as the cause of diseases classified elsewhere: Secondary | ICD-10-CM | POA: Diagnosis not present

## 2019-11-21 DIAGNOSIS — I952 Hypotension due to drugs: Secondary | ICD-10-CM | POA: Diagnosis not present

## 2019-11-21 DIAGNOSIS — J13 Pneumonia due to Streptococcus pneumoniae: Secondary | ICD-10-CM | POA: Diagnosis not present

## 2019-11-21 DIAGNOSIS — G9341 Metabolic encephalopathy: Secondary | ICD-10-CM | POA: Diagnosis not present

## 2019-11-21 DIAGNOSIS — J189 Pneumonia, unspecified organism: Secondary | ICD-10-CM

## 2019-11-21 DIAGNOSIS — J690 Pneumonitis due to inhalation of food and vomit: Secondary | ICD-10-CM | POA: Diagnosis not present

## 2019-11-21 LAB — POCT I-STAT 7, (LYTES, BLD GAS, ICA,H+H)
Acid-Base Excess: 13 mmol/L — ABNORMAL HIGH (ref 0.0–2.0)
Acid-Base Excess: 14 mmol/L — ABNORMAL HIGH (ref 0.0–2.0)
Acid-Base Excess: 15 mmol/L — ABNORMAL HIGH (ref 0.0–2.0)
Acid-Base Excess: 16 mmol/L — ABNORMAL HIGH (ref 0.0–2.0)
Bicarbonate: 41.2 mmol/L — ABNORMAL HIGH (ref 20.0–28.0)
Bicarbonate: 42.8 mmol/L — ABNORMAL HIGH (ref 20.0–28.0)
Bicarbonate: 43.3 mmol/L — ABNORMAL HIGH (ref 20.0–28.0)
Bicarbonate: 43.8 mmol/L — ABNORMAL HIGH (ref 20.0–28.0)
Calcium, Ion: 1.1 mmol/L — ABNORMAL LOW (ref 1.15–1.40)
Calcium, Ion: 1.1 mmol/L — ABNORMAL LOW (ref 1.15–1.40)
Calcium, Ion: 1.11 mmol/L — ABNORMAL LOW (ref 1.15–1.40)
Calcium, Ion: 1.12 mmol/L — ABNORMAL LOW (ref 1.15–1.40)
HCT: 32 % — ABNORMAL LOW (ref 36.0–46.0)
HCT: 35 % — ABNORMAL LOW (ref 36.0–46.0)
HCT: 35 % — ABNORMAL LOW (ref 36.0–46.0)
HCT: 37 % (ref 36.0–46.0)
Hemoglobin: 10.9 g/dL — ABNORMAL LOW (ref 12.0–15.0)
Hemoglobin: 11.9 g/dL — ABNORMAL LOW (ref 12.0–15.0)
Hemoglobin: 11.9 g/dL — ABNORMAL LOW (ref 12.0–15.0)
Hemoglobin: 12.6 g/dL (ref 12.0–15.0)
O2 Saturation: 77 %
O2 Saturation: 79 %
O2 Saturation: 79 %
O2 Saturation: 96 %
Patient temperature: 101.1
Potassium: 3 mmol/L — ABNORMAL LOW (ref 3.5–5.1)
Potassium: 3.3 mmol/L — ABNORMAL LOW (ref 3.5–5.1)
Potassium: 3.3 mmol/L — ABNORMAL LOW (ref 3.5–5.1)
Potassium: 3.4 mmol/L — ABNORMAL LOW (ref 3.5–5.1)
Sodium: 138 mmol/L (ref 135–145)
Sodium: 138 mmol/L (ref 135–145)
Sodium: 138 mmol/L (ref 135–145)
Sodium: 139 mmol/L (ref 135–145)
TCO2: 43 mmol/L — ABNORMAL HIGH (ref 22–32)
TCO2: 45 mmol/L — ABNORMAL HIGH (ref 22–32)
TCO2: 46 mmol/L — ABNORMAL HIGH (ref 22–32)
TCO2: 47 mmol/L — ABNORMAL HIGH (ref 22–32)
pCO2 arterial: 62.4 mmHg — ABNORMAL HIGH (ref 32.0–48.0)
pCO2 arterial: 63.7 mmHg — ABNORMAL HIGH (ref 32.0–48.0)
pCO2 arterial: 74.7 mmHg (ref 32.0–48.0)
pCO2 arterial: 96.1 mmHg (ref 32.0–48.0)
pH, Arterial: 7.274 — ABNORMAL LOW (ref 7.350–7.450)
pH, Arterial: 7.371 (ref 7.350–7.450)
pH, Arterial: 7.427 (ref 7.350–7.450)
pH, Arterial: 7.435 (ref 7.350–7.450)
pO2, Arterial: 42 mmHg — ABNORMAL LOW (ref 83.0–108.0)
pO2, Arterial: 44 mmHg — ABNORMAL LOW (ref 83.0–108.0)
pO2, Arterial: 57 mmHg — ABNORMAL LOW (ref 83.0–108.0)
pO2, Arterial: 88 mmHg (ref 83.0–108.0)

## 2019-11-21 LAB — CBC
HCT: 30.2 % — ABNORMAL LOW (ref 36.0–46.0)
Hemoglobin: 9.5 g/dL — ABNORMAL LOW (ref 12.0–15.0)
MCH: 27 pg (ref 26.0–34.0)
MCHC: 31.5 g/dL (ref 30.0–36.0)
MCV: 85.8 fL (ref 80.0–100.0)
Platelets: 157 10*3/uL (ref 150–400)
RBC: 3.52 MIL/uL — ABNORMAL LOW (ref 3.87–5.11)
RDW: 15.4 % (ref 11.5–15.5)
WBC: 13.2 10*3/uL — ABNORMAL HIGH (ref 4.0–10.5)
nRBC: 0.2 % (ref 0.0–0.2)

## 2019-11-21 LAB — BASIC METABOLIC PANEL
Anion gap: 13 (ref 5–15)
Anion gap: 14 (ref 5–15)
Anion gap: 13 (ref 5–15)
BUN: 34 mg/dL — ABNORMAL HIGH (ref 6–20)
BUN: 40 mg/dL — ABNORMAL HIGH (ref 6–20)
BUN: 38 mg/dL — ABNORMAL HIGH (ref 6–20)
CO2: 35 mmol/L — ABNORMAL HIGH (ref 22–32)
CO2: 37 mmol/L — ABNORMAL HIGH (ref 22–32)
CO2: 38 mmol/L — ABNORMAL HIGH (ref 22–32)
Calcium: 7.4 mg/dL — ABNORMAL LOW (ref 8.9–10.3)
Calcium: 8.1 mg/dL — ABNORMAL LOW (ref 8.9–10.3)
Calcium: 8.3 mg/dL — ABNORMAL LOW (ref 8.9–10.3)
Chloride: 89 mmol/L — ABNORMAL LOW (ref 98–111)
Chloride: 87 mmol/L — ABNORMAL LOW (ref 98–111)
Chloride: 88 mmol/L — ABNORMAL LOW (ref 98–111)
Creatinine, Ser: 2.32 mg/dL — ABNORMAL HIGH (ref 0.44–1.00)
Creatinine, Ser: 2.37 mg/dL — ABNORMAL HIGH (ref 0.44–1.00)
Creatinine, Ser: 2.41 mg/dL — ABNORMAL HIGH (ref 0.44–1.00)
GFR calc Af Amer: 26 mL/min — ABNORMAL LOW (ref >60)
GFR calc Af Amer: 26 mL/min — ABNORMAL LOW (ref >60)
GFR calc Af Amer: 25 mL/min — ABNORMAL LOW (ref >60)
GFR calc non Af Amer: 23 mL/min — ABNORMAL LOW (ref >60)
GFR calc non Af Amer: 22 mL/min — ABNORMAL LOW (ref >60)
GFR calc non Af Amer: 22 mL/min — ABNORMAL LOW (ref >60)
Glucose, Bld: 341 mg/dL — ABNORMAL HIGH (ref 70–99)
Glucose, Bld: 208 mg/dL — ABNORMAL HIGH (ref 70–99)
Glucose, Bld: 236 mg/dL — ABNORMAL HIGH (ref 70–99)
Potassium: 4.1 mmol/L (ref 3.5–5.1)
Potassium: 3 mmol/L — ABNORMAL LOW (ref 3.5–5.1)
Potassium: 3.4 mmol/L — ABNORMAL LOW (ref 3.5–5.1)
Sodium: 137 mmol/L (ref 135–145)
Sodium: 138 mmol/L (ref 135–145)
Sodium: 139 mmol/L (ref 135–145)

## 2019-11-21 LAB — GLUCOSE, CAPILLARY
Glucose-Capillary: 157 mg/dL — ABNORMAL HIGH (ref 70–99)
Glucose-Capillary: 179 mg/dL — ABNORMAL HIGH (ref 70–99)
Glucose-Capillary: 207 mg/dL — ABNORMAL HIGH (ref 70–99)
Glucose-Capillary: 221 mg/dL — ABNORMAL HIGH (ref 70–99)
Glucose-Capillary: 228 mg/dL — ABNORMAL HIGH (ref 70–99)
Glucose-Capillary: 229 mg/dL — ABNORMAL HIGH (ref 70–99)

## 2019-11-21 IMAGING — DX DG CHEST 1V PORT
1 series · 2 of 2 positions shown · non-contrast
Comparison: [DATE].

CLINICAL DATA: Acute respiratory failure.

EXAM:
PORTABLE CHEST 1 VIEW

[Series 1: chest ap · 0.14mm/px · 2 of 2 slices shown]
[im 1/2]
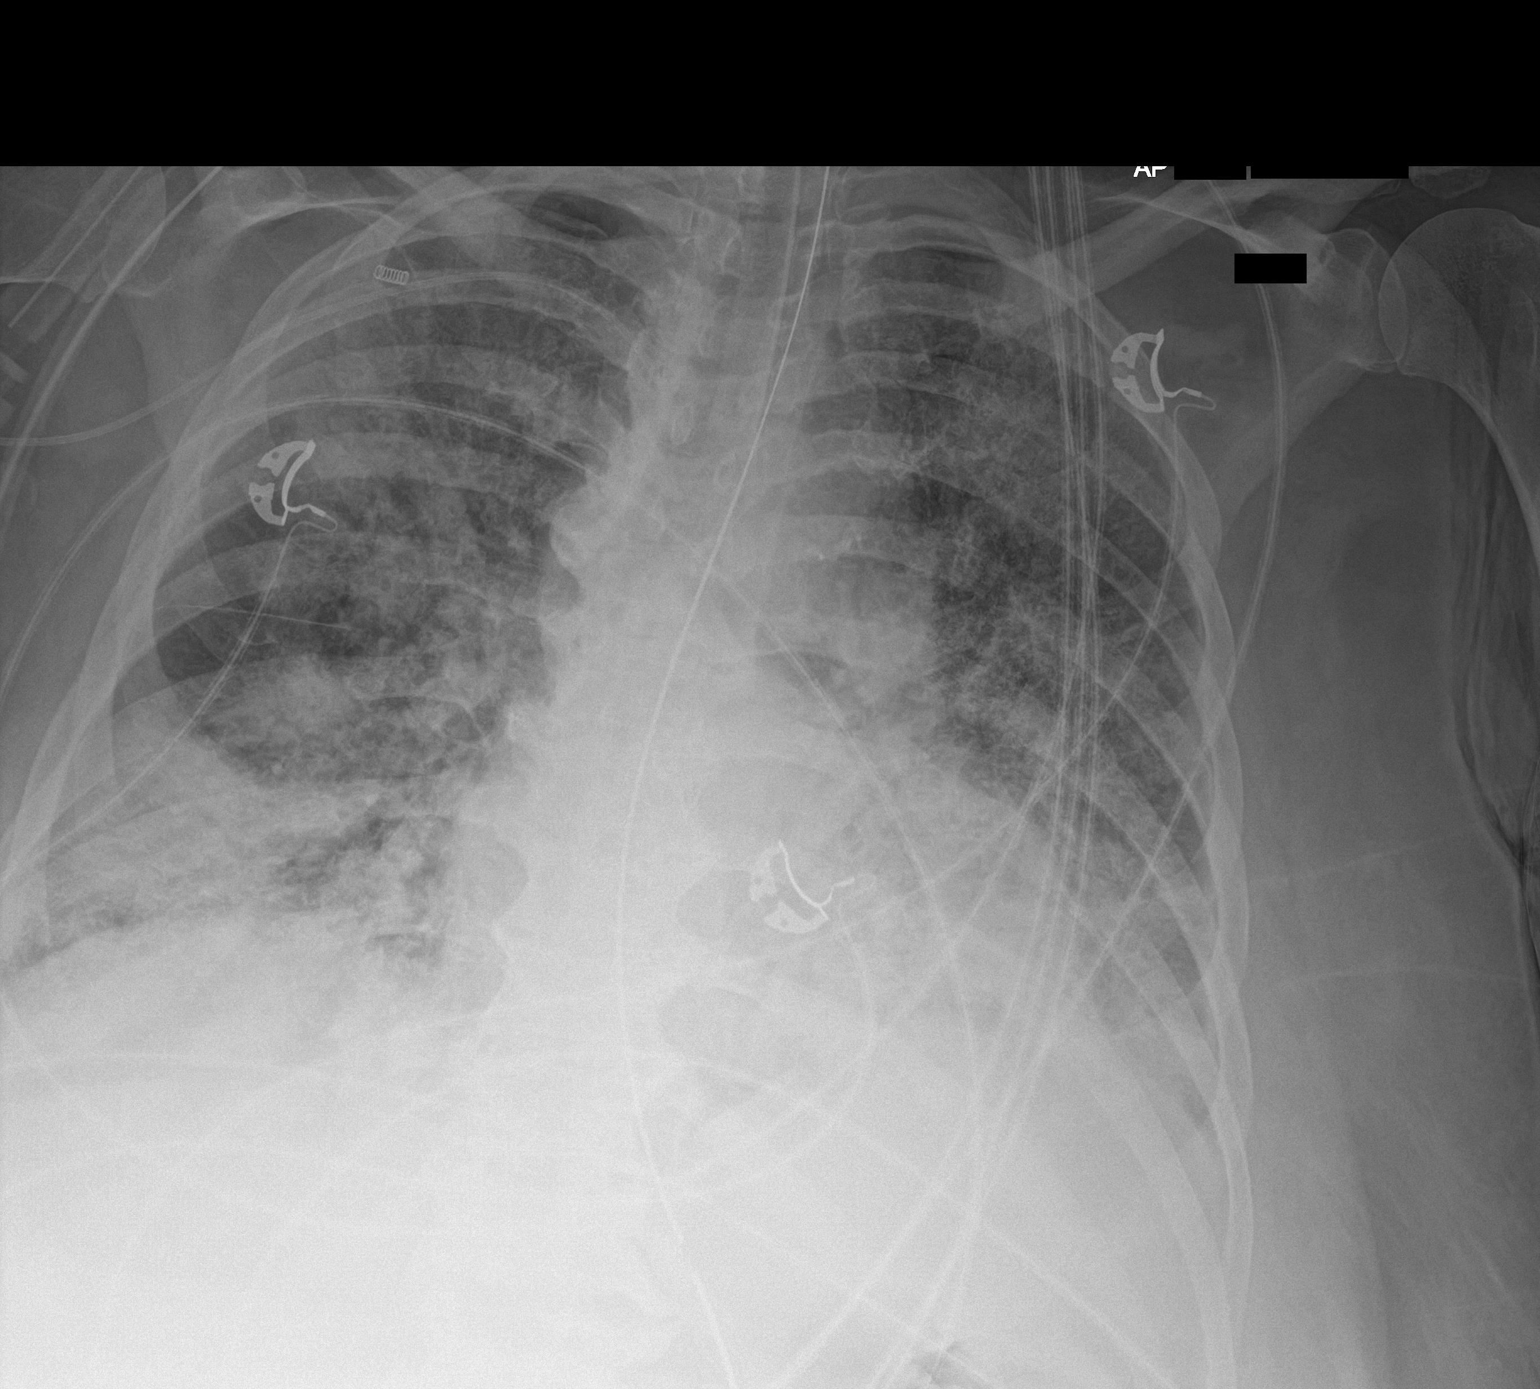
[im 2/2]
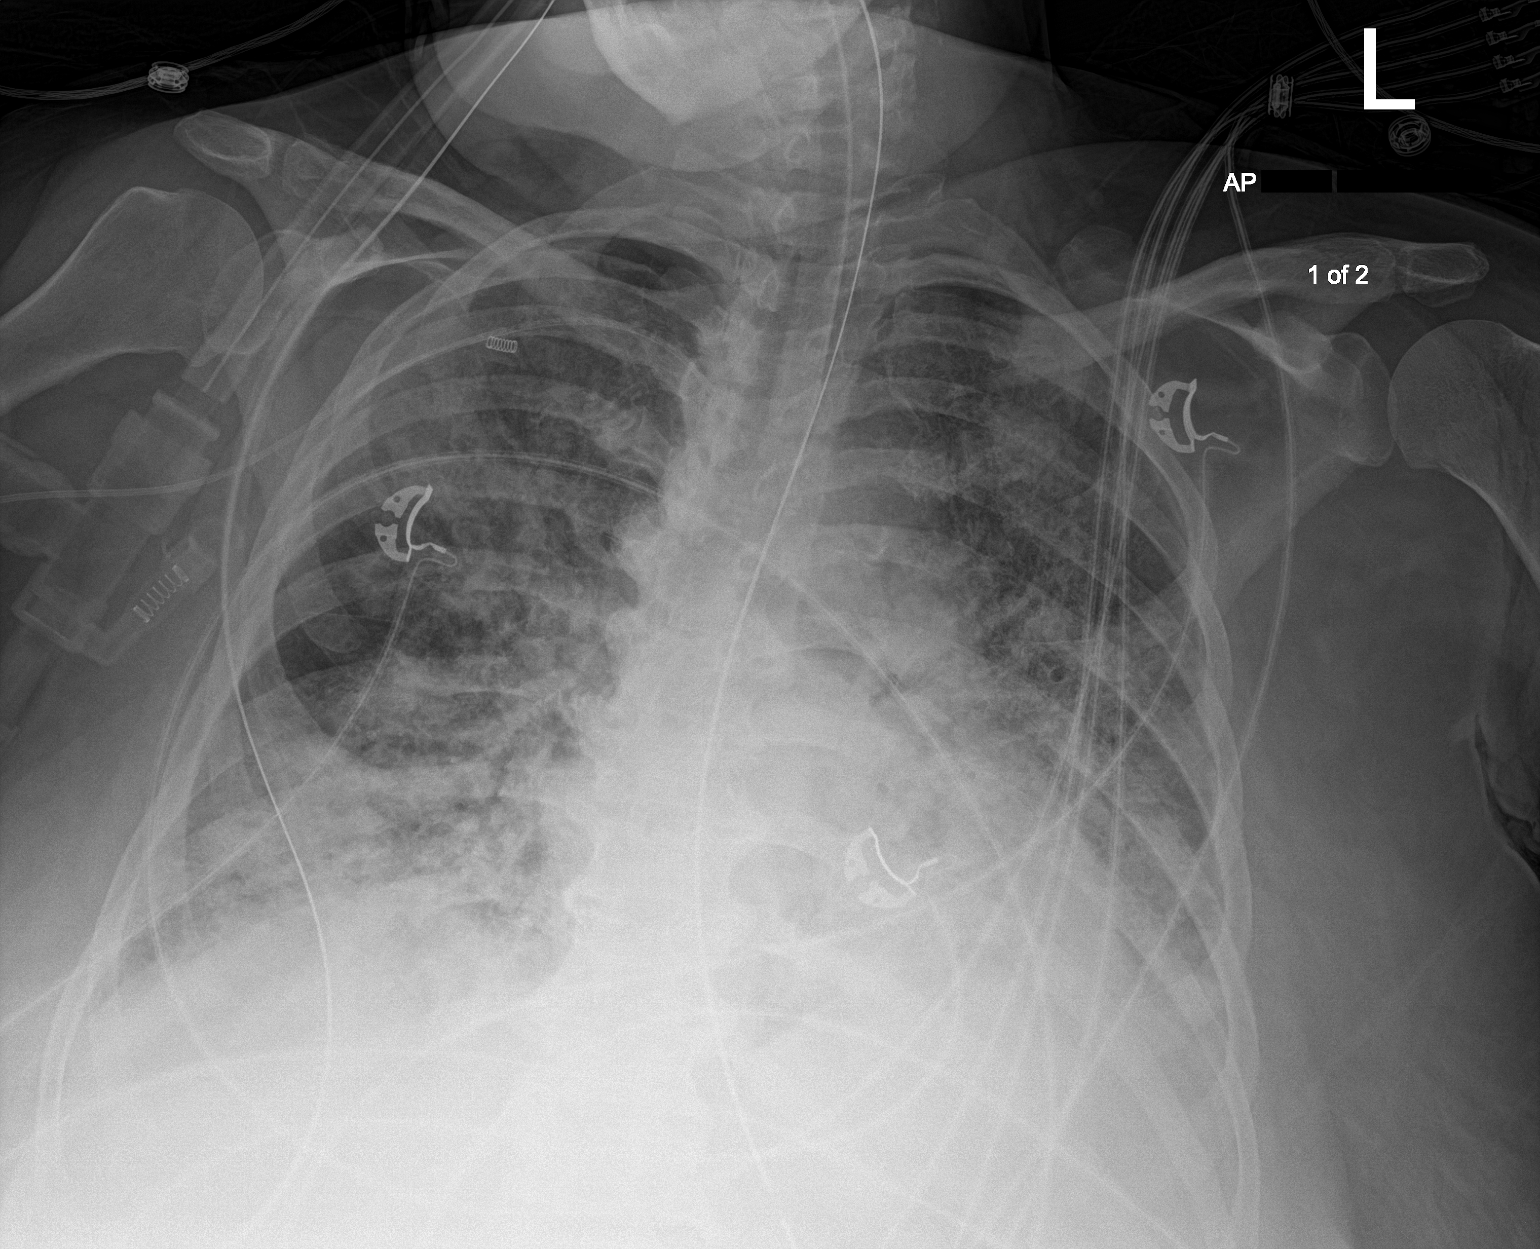

[2 of 2 positions shown; findings below may reference images not displayed]

FINDINGS: Stable cardiomediastinal silhouette. Endotracheal and nasogastric
tubes are unchanged in position. Right-sided PICC line is unchanged
in position. Stable bilateral diffuse airspace opacities are noted
concerning for multifocal pneumonia. No pneumothorax is noted. Bony
thorax is unremarkable.
IMPRESSION: Stable support apparatus. Stable bilateral lung opacities concerning
for multifocal pneumonia.

## 2019-11-21 MED ORDER — METOLAZONE 5 MG PO TABS
5.0000 mg | ORAL_TABLET | Freq: Once | ORAL | Status: AC
  Administered 2019-11-21: 5 mg
  Filled 2019-11-21: qty 1

## 2019-11-21 MED ORDER — INSULIN GLARGINE 100 UNIT/ML ~~LOC~~ SOLN
10.0000 [IU] | Freq: Two times a day (BID) | SUBCUTANEOUS | Status: DC
  Administered 2019-11-21 – 2019-11-23 (×5): 10 [IU] via SUBCUTANEOUS
  Filled 2019-11-21 (×7): qty 0.1

## 2019-11-21 MED ORDER — FENTANYL 2500MCG IN NS 250ML (10MCG/ML) PREMIX INFUSION
50.0000 ug/h | INTRAVENOUS | Status: DC
  Administered 2019-11-21: 150 ug/h via INTRAVENOUS
  Administered 2019-11-21: 175 ug/h via INTRAVENOUS
  Administered 2019-11-22: 200 ug/h via INTRAVENOUS
  Administered 2019-11-23 (×2): 150 ug/h via INTRAVENOUS
  Administered 2019-11-24: 175 ug/h via INTRAVENOUS
  Administered 2019-11-25: 100 ug/h via INTRAVENOUS
  Filled 2019-11-21 (×6): qty 250

## 2019-11-21 MED ORDER — FUROSEMIDE 10 MG/ML IJ SOLN
40.0000 mg | Freq: Once | INTRAMUSCULAR | Status: AC
  Administered 2019-11-21: 40 mg via INTRAVENOUS
  Filled 2019-11-21: qty 4

## 2019-11-21 MED ORDER — POTASSIUM CHLORIDE 20 MEQ/15ML (10%) PO SOLN
40.0000 meq | Freq: Once | ORAL | Status: AC
  Administered 2019-11-21: 40 meq
  Filled 2019-11-21: qty 30

## 2019-11-21 MED ORDER — FENTANYL BOLUS VIA INFUSION
50.0000 ug | INTRAVENOUS | Status: DC | PRN
  Administered 2019-11-21 – 2019-11-24 (×11): 50 ug via INTRAVENOUS
  Filled 2019-11-21: qty 50

## 2019-11-21 MED ORDER — FENTANYL CITRATE (PF) 100 MCG/2ML IJ SOLN
50.0000 ug | Freq: Once | INTRAMUSCULAR | Status: DC
  Filled 2019-11-21: qty 2

## 2019-11-21 MED ORDER — INSULIN ASPART 100 UNIT/ML ~~LOC~~ SOLN
0.0000 [IU] | SUBCUTANEOUS | Status: DC
  Administered 2019-11-21 (×2): 7 [IU] via SUBCUTANEOUS
  Administered 2019-11-22: 4 [IU] via SUBCUTANEOUS
  Administered 2019-11-22: 3 [IU] via SUBCUTANEOUS
  Administered 2019-11-22 – 2019-11-23 (×7): 4 [IU] via SUBCUTANEOUS
  Administered 2019-11-23: 3 [IU] via SUBCUTANEOUS
  Administered 2019-11-23 (×2): 4 [IU] via SUBCUTANEOUS
  Administered 2019-11-23: 3 [IU] via SUBCUTANEOUS
  Administered 2019-11-24 (×3): 4 [IU] via SUBCUTANEOUS
  Administered 2019-11-24: 3 [IU] via SUBCUTANEOUS
  Administered 2019-11-25: 4 [IU] via SUBCUTANEOUS
  Administered 2019-11-25 (×2): 3 [IU] via SUBCUTANEOUS
  Administered 2019-11-25 – 2019-11-26 (×4): 4 [IU] via SUBCUTANEOUS
  Administered 2019-11-26: 3 [IU] via SUBCUTANEOUS
  Administered 2019-11-26: 11 [IU] via SUBCUTANEOUS
  Administered 2019-11-27: 7 [IU] via SUBCUTANEOUS
  Administered 2019-11-27 (×2): 3 [IU] via SUBCUTANEOUS
  Administered 2019-11-27: 8 [IU] via SUBCUTANEOUS
  Administered 2019-11-27: 4 [IU] via SUBCUTANEOUS
  Administered 2019-11-28: 3 [IU] via SUBCUTANEOUS
  Administered 2019-11-28 (×3): 4 [IU] via SUBCUTANEOUS
  Administered 2019-11-29: 11 [IU] via SUBCUTANEOUS
  Administered 2019-11-29: 3 [IU] via SUBCUTANEOUS
  Administered 2019-11-29: 7 [IU] via SUBCUTANEOUS
  Administered 2019-11-29: 11 [IU] via SUBCUTANEOUS
  Administered 2019-11-30 (×2): 15 [IU] via SUBCUTANEOUS
  Administered 2019-11-30: 11 [IU] via SUBCUTANEOUS
  Administered 2019-11-30 (×3): 7 [IU] via SUBCUTANEOUS
  Administered 2019-12-01: 3 [IU] via SUBCUTANEOUS
  Administered 2019-12-01 (×3): 7 [IU] via SUBCUTANEOUS
  Administered 2019-12-01: 4 [IU] via SUBCUTANEOUS
  Administered 2019-12-01 – 2019-12-02 (×2): 3 [IU] via SUBCUTANEOUS
  Administered 2019-12-02: 4 [IU] via SUBCUTANEOUS
  Administered 2019-12-02 (×2): 3 [IU] via SUBCUTANEOUS
  Administered 2019-12-03 (×2): 4 [IU] via SUBCUTANEOUS
  Administered 2019-12-03 (×2): 3 [IU] via SUBCUTANEOUS
  Administered 2019-12-04: 4 [IU] via SUBCUTANEOUS
  Administered 2019-12-04 – 2019-12-05 (×4): 3 [IU] via SUBCUTANEOUS
  Administered 2019-12-05 – 2019-12-06 (×2): 4 [IU] via SUBCUTANEOUS
  Administered 2019-12-06: 3 [IU] via SUBCUTANEOUS
  Administered 2019-12-06 (×2): 7 [IU] via SUBCUTANEOUS
  Administered 2019-12-07 (×3): 4 [IU] via SUBCUTANEOUS
  Administered 2019-12-08: 3 [IU] via SUBCUTANEOUS
  Administered 2019-12-08: 0 [IU] via SUBCUTANEOUS
  Administered 2019-12-09 – 2019-12-10 (×3): 4 [IU] via SUBCUTANEOUS
  Administered 2019-12-10: 3 [IU] via SUBCUTANEOUS
  Administered 2019-12-10 (×2): 4 [IU] via SUBCUTANEOUS
  Administered 2019-12-11: 3 [IU] via SUBCUTANEOUS
  Administered 2019-12-11: 7 [IU] via SUBCUTANEOUS
  Administered 2019-12-11 – 2019-12-12 (×2): 4 [IU] via SUBCUTANEOUS
  Administered 2019-12-12: 7 [IU] via SUBCUTANEOUS
  Administered 2019-12-12 (×2): 4 [IU] via SUBCUTANEOUS
  Administered 2019-12-12: 7 [IU] via SUBCUTANEOUS
  Administered 2019-12-13: 4 [IU] via SUBCUTANEOUS
  Administered 2019-12-13: 3 [IU] via SUBCUTANEOUS
  Administered 2019-12-13: 4 [IU] via SUBCUTANEOUS
  Administered 2019-12-13: 7 [IU] via SUBCUTANEOUS
  Administered 2019-12-14: 3 [IU] via SUBCUTANEOUS
  Administered 2019-12-14 (×2): 4 [IU] via SUBCUTANEOUS
  Administered 2019-12-14: 3 [IU] via SUBCUTANEOUS
  Administered 2019-12-15: 4 [IU] via SUBCUTANEOUS
  Administered 2019-12-15: 3 [IU] via SUBCUTANEOUS
  Administered 2019-12-15: 7 [IU] via SUBCUTANEOUS
  Administered 2019-12-15 (×2): 3 [IU] via SUBCUTANEOUS
  Administered 2019-12-16: 4 [IU] via SUBCUTANEOUS
  Administered 2019-12-16 (×2): 3 [IU] via SUBCUTANEOUS
  Administered 2019-12-17: 4 [IU] via SUBCUTANEOUS
  Administered 2019-12-17 (×2): 3 [IU] via SUBCUTANEOUS
  Administered 2019-12-17: 4 [IU] via SUBCUTANEOUS
  Administered 2019-12-18: 3 [IU] via SUBCUTANEOUS
  Administered 2019-12-18: 7 [IU] via SUBCUTANEOUS
  Administered 2019-12-18: 3 [IU] via SUBCUTANEOUS
  Administered 2019-12-18 – 2019-12-19 (×3): 4 [IU] via SUBCUTANEOUS
  Administered 2019-12-20: 3 [IU] via SUBCUTANEOUS
  Administered 2019-12-20 (×2): 4 [IU] via SUBCUTANEOUS
  Administered 2019-12-20: 3 [IU] via SUBCUTANEOUS
  Administered 2019-12-21: 7 [IU] via SUBCUTANEOUS
  Administered 2019-12-21: 4 [IU] via SUBCUTANEOUS
  Administered 2019-12-21: 7 [IU] via SUBCUTANEOUS
  Administered 2019-12-21: 3 [IU] via SUBCUTANEOUS
  Administered 2019-12-22 (×3): 4 [IU] via SUBCUTANEOUS
  Administered 2019-12-22: 3 [IU] via SUBCUTANEOUS
  Administered 2019-12-22: 4 [IU] via SUBCUTANEOUS
  Administered 2019-12-23 (×2): 3 [IU] via SUBCUTANEOUS
  Administered 2019-12-23 – 2019-12-24 (×2): 4 [IU] via SUBCUTANEOUS
  Administered 2019-12-24: 3 [IU] via SUBCUTANEOUS
  Administered 2019-12-24: 7 [IU] via SUBCUTANEOUS
  Administered 2019-12-24 (×2): 3 [IU] via SUBCUTANEOUS
  Administered 2019-12-25: 7 [IU] via SUBCUTANEOUS
  Administered 2019-12-25 – 2019-12-26 (×6): 4 [IU] via SUBCUTANEOUS
  Administered 2019-12-26: 3 [IU] via SUBCUTANEOUS
  Administered 2019-12-26 – 2019-12-27 (×2): 4 [IU] via SUBCUTANEOUS
  Administered 2019-12-27 (×3): 7 [IU] via SUBCUTANEOUS
  Administered 2019-12-28: 4 [IU] via SUBCUTANEOUS
  Administered 2019-12-28: 7 [IU] via SUBCUTANEOUS
  Administered 2019-12-28 (×2): 3 [IU] via SUBCUTANEOUS
  Administered 2019-12-28: 4 [IU] via SUBCUTANEOUS
  Administered 2019-12-28: 7 [IU] via SUBCUTANEOUS
  Administered 2019-12-29 (×2): 4 [IU] via SUBCUTANEOUS
  Administered 2019-12-29 (×2): 7 [IU] via SUBCUTANEOUS
  Administered 2019-12-29 – 2019-12-30 (×2): 11 [IU] via SUBCUTANEOUS
  Administered 2019-12-30 (×3): 7 [IU] via SUBCUTANEOUS
  Administered 2019-12-30: 11 [IU] via SUBCUTANEOUS
  Administered 2019-12-31: 3 [IU] via SUBCUTANEOUS
  Administered 2019-12-31: 11 [IU] via SUBCUTANEOUS
  Administered 2019-12-31: 7 [IU] via SUBCUTANEOUS
  Administered 2019-12-31: 3 [IU] via SUBCUTANEOUS
  Administered 2019-12-31 – 2020-01-01 (×4): 4 [IU] via SUBCUTANEOUS
  Administered 2020-01-01: 7 [IU] via SUBCUTANEOUS
  Administered 2020-01-01 – 2020-01-02 (×3): 4 [IU] via SUBCUTANEOUS
  Administered 2020-01-02: 7 [IU] via SUBCUTANEOUS
  Administered 2020-01-02: 11 [IU] via SUBCUTANEOUS
  Administered 2020-01-02: 15 [IU] via SUBCUTANEOUS
  Administered 2020-01-02: 4 [IU] via SUBCUTANEOUS
  Administered 2020-01-02: 5 [IU] via SUBCUTANEOUS
  Administered 2020-01-03: 7 [IU] via SUBCUTANEOUS
  Administered 2020-01-03: 4 [IU] via SUBCUTANEOUS
  Administered 2020-01-03: 7 [IU] via SUBCUTANEOUS
  Administered 2020-01-03: 4 [IU] via SUBCUTANEOUS
  Administered 2020-01-03: 15 [IU] via SUBCUTANEOUS
  Administered 2020-01-04: 3 [IU] via SUBCUTANEOUS
  Administered 2020-01-04: 7 [IU] via SUBCUTANEOUS
  Administered 2020-01-04 (×3): 11 [IU] via SUBCUTANEOUS
  Administered 2020-01-05: 7 [IU] via SUBCUTANEOUS
  Administered 2020-01-05: 15 [IU] via SUBCUTANEOUS
  Administered 2020-01-05: 11 [IU] via SUBCUTANEOUS

## 2019-11-21 MED ORDER — POTASSIUM CHLORIDE 10 MEQ/50ML IV SOLN
10.0000 meq | INTRAVENOUS | Status: AC
  Administered 2019-11-21 (×4): 10 meq via INTRAVENOUS
  Filled 2019-11-21 (×5): qty 50

## 2019-11-21 NOTE — Progress Notes (Addendum)
NAME:  Susan [name], MRN:  [id number], DOB:  [birth year], LOS: 35 ADMISSION DATE:  11/11/2019, CONSULTATION DATE:  3/10 REFERRING MD:  Sabra Heck, CHIEF COMPLAINT:  Acute encephalopathy and respiratory failure    Brief History   59 year old female admitted 3/10 with overdose complicated by aspiration PNA, toxic metabolic encephalopathy and septic shock.     Past Medical History  Frequent falls at home,Tobacco abuse, diabetes with painful polyneuropathy, bipolar disease, GERD, HIV, schizophrenia, history of IV drug abuse, seizure disorder.   Significant Hospital Events   3/10 Admit with overdose, intubated on arrival, in shock   3/15 Ventilator asynchrony, heavy sedation & intermittent paralysis   3/16 Lasix drip.  Awaiting TEE.  Sputum w/ MRSA.   Coming back down on sedation, changed to [facility]. TEE showing NO EVIDENCE of endocarditis. ABX changed to pen G per ID. MRSA therapy complete. EF improved. Now up to 50% from 20-25% 3/17 Heavily sedated. Having episodes of bradycardia. Still massively overloaded (>14L), increasing lasix gtt to 8 mg from 4, adding metolazone.  3/19 Lasix to 40 every 12, changing RASS goal to -2, decrease in fentanyl, decreasing PEEP, anticipating trialing pressure support ventilation.  Watching upward trend of white blood cell count and low-grade fever 3/20 BM early am. Agitation early am > sedate post am PT psy meds  Consults:  ID Cards for TEE  Procedures:  ETT 3/10 >>  R Fem TLC (ER) 3/10  R IJ TLC 3/10  L IJ TLC 3/10 >> 3/17  RUE PICC 3/16 >>   Significant Diagnostic Tests:   CT brain 3/10 >> Normal head CT  EEG 3/10 >> Moderate to severe diffuse encephalopathy. No seizures  Urine drug screen 3/10 >> positive for THC, acetaminophen Q000111Q, salicylate <7, ETOH Q000111Q  ECHO 3/11 >> EF 20-25%, LV function severely diminished, Global Hypokinesis,LV hypertrophy, Grade 1 Diastolic Dysfunction, Elevated PASP, Dilated LA, MV regurgitation, small mobile density right  coronary cusp  TEE 3/16 >> LVEF 55%, No LA/LAA thrombus or masses, Previously noted mass on the aortic valve is a node of Arancius.  Normal variant and no evidence of endocarditis.Trivial TR and trivial PR  Micro Data:  Influenza A/B 3/10 >> negative  COVID 3/10 >> negative  MRSA PCR 3/10 >> positive  BCx2 3/10 >> streptococcus pneumoniae >> S-rocephin BCx1 (aerobic only) 3/10 >> negative  Tracheal aspirate 3/11 >> MRSA BCx2 3/12 >> negative   Antimicrobials:  Vancomycin 3/10 >> 3/16 Unasyn 3/10 >> 3/16 Pen G 3/16 >>   Interim history/subjective:  RN reports pt previously constipated and had BM x1 this am.  Was agitated early am then am meds given and now sedate  Glucose range 157-341  Fever curve improved, afebrile, WBC 13.2  I/O 5.2L UOP, -1.6L in last 24 hours  Remains on PCV, 70% FiO2, PEEP 12  Objective   Blood pressure (!) 99/44, pulse 67, temperature 98.7 F (37.1 C), temperature source Oral, resp. rate 19, height 5\' 3"  (1.6 m), weight 101.5 kg, SpO2 100 %.    Vent Mode: PCV FiO2 (%):  [40 %-70 %] 70 % Set Rate:  [24 bmp] 24 bmp PEEP:  [8 cmH20-12 cmH20] 12 cmH20 Pressure Support:  [8 cmH20] 8 cmH20 Plateau Pressure:  [15 cmH20-16 cmH20] 16 cmH20   Intake/Output Summary (Last 24 hours) at 11/21/2019 1126 Last data filed at 11/21/2019 0800 Gross per 24 hour  Intake 3304.43 ml  Output 4748 ml  Net -1443.57 ml   Filed Weights   11/19/19 0424 11/20/19  [id number] 11/21/19 0200  Weight: 107 kg 106 kg 101.5 kg    Examination: General: critically ill appearing adult female lying in bed in NAD  HEENT: MM pink/moist, ETT, pupils 60mm, anicteric Neuro: sedate, no response to verbal stimuli  CV: s1s2 RRR, no m/r/g PULM:  Non-labored on vent, lungs bilaterally coarse with rhonchi GI: soft, bsx4 active  Extremities: warm/dry, generalized 1-2+ edema  Skin: no rashes or lesions  Resolved Hospital Problem list   Septic shock  Rhabdomyolysis Acute metabolic  encephalopathy Elevated INR MRSA PNA Drug Induced hypotension  Assessment & Plan:   ARDS / Acute hypoxic and hypercarbic respiratory failure w/ MRSA PNA  Tobacco Abuse Completed vanc 3/16.  -PCV ventilation  -wean PEEP / fiO2 for sats > 90% -follow intermittent CXR -continue diuresis  -VAP prevention measures   History of Asthma  -continue budesonide, duoneb   Acute systolic heart failure  Echo with EF 20-25%, potentially septic cardiomyopathy in setting of pneumococcal bacteremia. TEE confirms that this is NOT endocarditis and mass previously identified is a node of Arancius, EF now 50-55%.  Suspect stress cardiomyopathy with secondary volume overload.  -lasix 40 mg IV x1 + metolazone with KCL -levophed for MAP >65  -hold beta blocker with hypotension  -tele monitoring  -hold clonidine, norvasc, vasotec, lisinopril, maxide  Pneumococcal Bacteremia  Fever, Leukocytosis  -continue penicillin G, D11/14.  Stop date 3/23  -follow fever curve / WBC trend   Acute Toxic & Metabolic Encephalopathy.   History of schizophrenia & seizure disorder.home medications Abilify, Cogentin, doxepin, Neurontin and BuSpar all on her medicine profile. EEG without seizure activity. Seroquel stopped at suggestion of outpt PSY.  -continue cogentin, buspar, gabapentin, zoloft  -PAD protocol with precedex, fentanyl  -RASS Goal -1 to -2   Acute Kidney Injury  Suspect in setting of shock  -follow up BMP @ 1600 -Trend BMP / urinary output -Replace electrolytes as indicated -Avoid nephrotoxic agents, ensure adequate renal perfusion  Hypernatremia Intermittent hypokalemia  -200 ml free water Q4  -monitor lytes closely, replace as indicated   HIV Well controlled prior to admit.  -continue antiviral regimen  Constipation -continue bowel regimen   DM w/ hyperglycemia  -change SSI to resistant scale -increase lantus to 10 BID  -continue 12 units TF coverage  Best practice:  Diet: tube  feeds.   Pain/Anxiety/Delirium protocol (if indicated): precedex VAP protocol (if indicated): in place DVT prophylaxis: [facility] GI prophylaxis: PPI Glucose control: SSI Mobility: BR Code Status: full code  Family Communication: Daughter updated via phone 3/20. Patient's visitor is listed as sister but is actually just a friend. Daughter is DPOA. Disposition: to ICU    CC Time: 39 minutes   Noe Gens, MSN, NP-C [facility] 11/21/2019, 11:26 AM   Please see [email or web address] for pager details.

## 2019-11-21 NOTE — Progress Notes (Signed)
[facility] Progress Note Patient Name: Susan [name] DOB: Jan 03, 1961 MRN: [id number]   Date of Service  11/21/2019  HPI/Events of Note  Pt needs wrist restraints to prevent self-extubation.  eICU Interventions  Wrist restraints ordered.        [name] 11/21/2019, 8:13 PM

## 2019-11-21 NOTE — Progress Notes (Signed)
Patient having frequent PVCs. EKG performed and hand delivered to Dr. Loanne Drilling. Notified Dr. Loanne Drilling of critical QTC results on 12 lead and new order given for repeat 12 lead EKG tomorrow morning.

## 2019-11-21 NOTE — Progress Notes (Signed)
Critical ABG results called to E-link RN.

## 2019-11-21 NOTE — Progress Notes (Signed)
[facility] for Infectious Disease   Reason for visit: Follow up on Strep pneumonia  Interval History: remains intubated, on levophed.  WBC 13.2 and afebrile.    Physical Exam: Constitutional:  Vitals:   11/21/19 0800 11/21/19 0802  BP: (!) 99/44   Pulse: 67   Resp: 19   Temp:  98.7 F (37.1 C)  SpO2: 100%    patient is sedated Respiratory: respiratory effort on vent, CTA Cardiovascular: RRR GI: soft, nt, nd  Review of Systems: Unable to be assessed due to patient factors  Lab Results  Component Value Date   WBC 13.2 (H) 11/21/2019   HGB 10.9 (L) 11/21/2019   HCT 32.0 (L) 11/21/2019   MCV 85.8 11/21/2019   PLT 157 11/21/2019    Lab Results  Component Value Date   CREATININE 2.32 (H) 11/21/2019   BUN 34 (H) 11/21/2019   NA 139 11/21/2019   K 3.0 (L) 11/21/2019   CL 89 (L) 11/21/2019   CO2 35 (H) 11/21/2019    Lab Results  Component Value Date   ALT 18 11/14/2019   AST 42 (H) 11/14/2019   ALKPHOS 103 11/14/2019     Microbiology: Recent Results (from the past 240 hour(s))  Culture, blood (Routine x 2)     Status: Abnormal   Collection Time: 11/11/19  2:51 PM   Specimen: BLOOD  Result Value Ref Range Status   Specimen Description BLOOD CENTRAL LINE  Final   Special Requests   Final    BOTTLES DRAWN AEROBIC AND ANAEROBIC Blood Culture adequate volume   Culture  Setup Time   Final    GRAM POSITIVE COCCI IN CHAINS AEROBIC BOTTLE ONLY CRITICAL RESULT CALLED TO, READ BACK BY AND VERIFIED WITH: Salli Real [id number] Performed at [facility], [address], [place], [place] [address]    Culture STREPTOCOCCUS PNEUMONIAE (A)  Final   Report Status 11/14/2019 FINAL  Final   Organism ID, Bacteria STREPTOCOCCUS PNEUMONIAE  Final      Susceptibility   Streptococcus pneumoniae - MIC*    ERYTHROMYCIN <=0.12 SENSITIVE Sensitive     LEVOFLOXACIN 0.5 SENSITIVE Sensitive     VANCOMYCIN <=0.12 SENSITIVE Sensitive     PENICILLIN (meningitis)  <=0.06 SENSITIVE Sensitive     PENO - penicillin <=0.06      PENICILLIN (non-meningitis) <=0.06 SENSITIVE Sensitive     PENICILLIN (oral) <=0.06 SENSITIVE Sensitive     CEFTRIAXONE (non-meningitis) <=0.12 SENSITIVE Sensitive     CEFTRIAXONE (meningitis) <=0.12 SENSITIVE Sensitive     * STREPTOCOCCUS PNEUMONIAE  Culture, blood (Routine x 2)     Status: None   Collection Time: 11/11/19  2:56 PM   Specimen: BLOOD RIGHT HAND  Result Value Ref Range Status   Specimen Description BLOOD RIGHT HAND  Final   Special Requests   Final    BOTTLES DRAWN AEROBIC ONLY Blood Culture results may not be optimal due to an inadequate volume of blood received in culture bottles   Culture NO GROWTH 5 DAYS  Final   Report Status 11/16/2019 FINAL  Final  Urine culture     Status: None   Collection Time: 11/11/19  3:11 PM   Specimen: Urine, Catheterized  Result Value Ref Range Status   Specimen Description URINE, CATHETERIZED  Final   Special Requests NONE  Final   Culture   Final    NO GROWTH Performed at [facility], [address] [place], [place] [address]  Report Status 11/12/2019 FINAL  Final  Respiratory Panel by RT PCR (Flu A&B, Covid) - Nasopharyngeal Swab     Status: None   Collection Time: 11/11/19  3:13 PM   Specimen: Nasopharyngeal Swab  Result Value Ref Range Status   SARS Coronavirus 2 by RT PCR NEGATIVE NEGATIVE Final    Comment: (NOTE) SARS-CoV-2 target nucleic acids are NOT DETECTED. The SARS-CoV-2 RNA is generally detectable in upper respiratoy specimens during the acute phase of infection. The lowest concentration of SARS-CoV-2 viral copies this assay can detect is 131 copies/mL. A negative result does not preclude SARS-Cov-2 infection and should not be used as the sole basis for treatment or other patient management decisions. A negative result may occur with  improper specimen collection/handling, submission of specimen other than nasopharyngeal swab, presence of viral  mutation(s) within the areas targeted by this assay, and inadequate number of viral copies (<131 copies/mL). A negative result must be combined with clinical observations, patient history, and epidemiological information. The expected result is Negative. Fact Sheet for Patients:  PinkCheek.be Fact Sheet for Healthcare Providers:  GravelBags.it This test is not yet ap proved or cleared by the [place] FDA and  has been authorized for detection and/or diagnosis of SARS-CoV-2 by FDA under an Emergency Use Authorization (EUA). This EUA will remain  in effect (meaning this test can be used) for the duration of the COVID-19 declaration under Section 564(b)(1) of the Act, 21 U.S.C. section 360bbb-3(b)(1), unless the authorization is terminated or revoked sooner.    Influenza A by PCR NEGATIVE NEGATIVE Final   Influenza B by PCR NEGATIVE NEGATIVE Final    Comment: (NOTE) The Xpert Xpress SARS-CoV-2/FLU/RSV assay is intended as an aid in  the diagnosis of influenza from Nasopharyngeal swab specimens and  should not be used as a sole basis for treatment. Nasal washings and  aspirates are unacceptable for Xpert Xpress SARS-CoV-2/FLU/RSV  testing. Fact Sheet for Patients: PinkCheek.be Fact Sheet for Healthcare Providers: GravelBags.it This test is not yet approved or cleared by the [place] FDA and  has been authorized for detection and/or diagnosis of SARS-CoV-2 by  FDA under an Emergency Use Authorization (EUA). This EUA will remain  in effect (meaning this test can be used) for the duration of the  Covid-19 declaration under Section 564(b)(1) of the Act, 21  U.S.C. section 360bbb-3(b)(1), unless the authorization is  terminated or revoked. Performed at [facility], [place] [address] [place], [place] [address]   MRSA PCR Screening     Status: Abnormal    Collection Time: 11/11/19  8:23 PM   Specimen: Nasal Mucosa; Nasopharyngeal  Result Value Ref Range Status   MRSA by PCR POSITIVE (A) NEGATIVE Final    Comment: CRITICAL RESULT CALLED TO, READ BACK BY AND VERIFIED WITH: RN Cindee Salt [id number] @2219  THANEY Performed at [facility], [place] [address] [place], [place] [address]   Culture, respiratory (non-expectorated)     Status: None   Collection Time: 11/12/19  6:31 AM   Specimen: Tracheal Aspirate; Respiratory  Result Value Ref Range Status   Specimen Description TRACHEAL ASPIRATE  Final   Special Requests NONE  Final   Gram Stain   Final    FEW WBC PRESENT, PREDOMINANTLY PMN MODERATE GRAM POSITIVE COCCI FEW GRAM NEGATIVE RODS Performed at [facility], [place] [address], [place],  [address]    Culture   Final    RARE METHICILLIN RESISTANT STAPHYLOCOCCUS AUREUS RARE CANDIDA ALBICANS  Report Status 11/16/2019 FINAL  Final   Organism ID, Bacteria METHICILLIN RESISTANT STAPHYLOCOCCUS AUREUS  Final      Susceptibility   Methicillin resistant staphylococcus aureus - MIC*    CIPROFLOXACIN >=8 RESISTANT Resistant     ERYTHROMYCIN >=8 RESISTANT Resistant     GENTAMICIN <=0.5 SENSITIVE Sensitive     OXACILLIN >=4 RESISTANT Resistant     TETRACYCLINE <=1 SENSITIVE Sensitive     VANCOMYCIN <=0.5 SENSITIVE Sensitive     TRIMETH/SULFA <=10 SENSITIVE Sensitive     CLINDAMYCIN <=0.25 SENSITIVE Sensitive     RIFAMPIN <=0.5 SENSITIVE Sensitive     Inducible Clindamycin NEGATIVE Sensitive     * RARE METHICILLIN RESISTANT STAPHYLOCOCCUS AUREUS  Culture, blood (routine x 2)     Status: None   Collection Time: 11/13/19  6:21 PM   Specimen: BLOOD LEFT HAND  Result Value Ref Range Status   Specimen Description BLOOD LEFT HAND  Final   Special Requests   Final    BOTTLES DRAWN AEROBIC ONLY Blood Culture adequate volume   Culture   Final    NO GROWTH 5 DAYS Performed at [facility], [address], [place], [place]  [address]    Report Status 11/18/2019 FINAL  Final  Culture, blood (routine x 2)     Status: None   Collection Time: 11/13/19  6:31 PM   Specimen: BLOOD LEFT HAND  Result Value Ref Range Status   Specimen Description BLOOD LEFT HAND  Final   Special Requests   Final    BOTTLES DRAWN AEROBIC ONLY Blood Culture adequate volume   Culture   Final    NO GROWTH 5 DAYS Performed at [facility], [place] [address], [place], [place] [address]    Report Status 11/18/2019 FINAL  Final    Impression/Plan:  1. Strep pneumonia bacteremia with pneumonia - on day 5 of penicillin and treating for 14 days through 3/23 then can stop antibiotics.  End date in place.   2. HIV - on Tivicay and Descovy and will continue while intubated.  On Biktarvy at home and can continue back on that at discharge.    I will sign off, call with questions.

## 2019-11-21 NOTE — Progress Notes (Signed)
Critical ABG results given to Dr. Lucile Shutters.  Non new orders at this time.

## 2019-11-21 NOTE — Progress Notes (Signed)
[facility] Progress Note Patient Name: Susan [name] DOB: May 15, 1961 MRN: [id number]   Date of Service  11/21/2019  HPI/Events of Note  ABG: 7.43/ 42/ 63.7/ 42.8/  77% FIO2 40%, PEEP 10  eICU Interventions  FIO2 increased to 70 %, PEEP increased to 12,  Repeat ABG at 5:15 a.m.        [name] Amonie Wisser 11/21/2019, 4:12 AM

## 2019-11-22 ENCOUNTER — Inpatient Hospital Stay (HOSPITAL_COMMUNITY): Payer: Medicare Other

## 2019-11-22 DIAGNOSIS — J9601 Acute respiratory failure with hypoxia: Secondary | ICD-10-CM | POA: Diagnosis not present

## 2019-11-22 LAB — POCT I-STAT 7, (LYTES, BLD GAS, ICA,H+H)
Acid-Base Excess: 15 mmol/L — ABNORMAL HIGH (ref 0.0–2.0)
Acid-Base Excess: 14 mmol/L — ABNORMAL HIGH (ref 0.0–2.0)
Acid-Base Excess: 15 mmol/L — ABNORMAL HIGH (ref 0.0–2.0)
Bicarbonate: 44.9 mmol/L — ABNORMAL HIGH (ref 20.0–28.0)
Bicarbonate: 43.9 mmol/L — ABNORMAL HIGH (ref 20.0–28.0)
Bicarbonate: 42.6 mmol/L — ABNORMAL HIGH (ref 20.0–28.0)
Calcium, Ion: 1.13 mmol/L — ABNORMAL LOW (ref 1.15–1.40)
Calcium, Ion: 1.12 mmol/L — ABNORMAL LOW (ref 1.15–1.40)
Calcium, Ion: 1.13 mmol/L — ABNORMAL LOW (ref 1.15–1.40)
HCT: 32 % — ABNORMAL LOW (ref 36.0–46.0)
HCT: 34 % — ABNORMAL LOW (ref 36.0–46.0)
HCT: 30 % — ABNORMAL LOW (ref 36.0–46.0)
Hemoglobin: 10.9 g/dL — ABNORMAL LOW (ref 12.0–15.0)
Hemoglobin: 11.6 g/dL — ABNORMAL LOW (ref 12.0–15.0)
Hemoglobin: 10.2 g/dL — ABNORMAL LOW (ref 12.0–15.0)
O2 Saturation: 96 %
O2 Saturation: 97 %
O2 Saturation: 81 %
Patient temperature: 100
Patient temperature: 100.2
Patient temperature: 99.3
Potassium: 3.4 mmol/L — ABNORMAL LOW (ref 3.5–5.1)
Potassium: 3.5 mmol/L (ref 3.5–5.1)
Potassium: 3.1 mmol/L — ABNORMAL LOW (ref 3.5–5.1)
Sodium: 138 mmol/L (ref 135–145)
Sodium: 138 mmol/L (ref 135–145)
Sodium: 137 mmol/L (ref 135–145)
TCO2: 48 mmol/L — ABNORMAL HIGH (ref 22–32)
TCO2: 47 mmol/L — ABNORMAL HIGH (ref 22–32)
TCO2: 45 mmol/L — ABNORMAL HIGH (ref 22–32)
pCO2 arterial: 92.5 mmHg (ref 32.0–48.0)
pCO2 arterial: 96.4 mmHg (ref 32.0–48.0)
pCO2 arterial: 70.6 mmHg (ref 32.0–48.0)
pH, Arterial: 7.298 — ABNORMAL LOW (ref 7.350–7.450)
pH, Arterial: 7.271 — ABNORMAL LOW (ref 7.350–7.450)
pH, Arterial: 7.39 (ref 7.350–7.450)
pO2, Arterial: 104 mmHg (ref 83.0–108.0)
pO2, Arterial: 112 mmHg — ABNORMAL HIGH (ref 83.0–108.0)
pO2, Arterial: 50 mmHg — ABNORMAL LOW (ref 83.0–108.0)

## 2019-11-22 LAB — CBC
HCT: 35.1 % — ABNORMAL LOW (ref 36.0–46.0)
Hemoglobin: 10.7 g/dL — ABNORMAL LOW (ref 12.0–15.0)
MCH: 27.1 pg (ref 26.0–34.0)
MCHC: 30.5 g/dL (ref 30.0–36.0)
MCV: 88.9 fL (ref 80.0–100.0)
Platelets: 221 10*3/uL (ref 150–400)
RBC: 3.95 MIL/uL (ref 3.87–5.11)
RDW: 15.6 % — ABNORMAL HIGH (ref 11.5–15.5)
WBC: 16 10*3/uL — ABNORMAL HIGH (ref 4.0–10.5)
nRBC: 0.1 % (ref 0.0–0.2)

## 2019-11-22 LAB — GLUCOSE, CAPILLARY
Glucose-Capillary: 132 mg/dL — ABNORMAL HIGH (ref 70–99)
Glucose-Capillary: 151 mg/dL — ABNORMAL HIGH (ref 70–99)
Glucose-Capillary: 161 mg/dL — ABNORMAL HIGH (ref 70–99)
Glucose-Capillary: 164 mg/dL — ABNORMAL HIGH (ref 70–99)
Glucose-Capillary: 175 mg/dL — ABNORMAL HIGH (ref 70–99)
Glucose-Capillary: 196 mg/dL — ABNORMAL HIGH (ref 70–99)

## 2019-11-22 LAB — BASIC METABOLIC PANEL
Anion gap: 13 (ref 5–15)
BUN: 38 mg/dL — ABNORMAL HIGH (ref 6–20)
CO2: 40 mmol/L — ABNORMAL HIGH (ref 22–32)
Calcium: 8.5 mg/dL — ABNORMAL LOW (ref 8.9–10.3)
Chloride: 88 mmol/L — ABNORMAL LOW (ref 98–111)
Creatinine, Ser: 2.5 mg/dL — ABNORMAL HIGH (ref 0.44–1.00)
GFR calc Af Amer: 24 mL/min — ABNORMAL LOW (ref >60)
GFR calc non Af Amer: 21 mL/min — ABNORMAL LOW (ref >60)
Glucose, Bld: 139 mg/dL — ABNORMAL HIGH (ref 70–99)
Potassium: 3.7 mmol/L (ref 3.5–5.1)
Sodium: 141 mmol/L (ref 135–145)

## 2019-11-22 IMAGING — DX DG CHEST 1V PORT
1 series · 1 of 1 positions shown · non-contrast
Comparison: [DATE]

CLINICAL DATA: Acute respiratory failure

EXAM:
PORTABLE CHEST 1 VIEW

[chest ap]
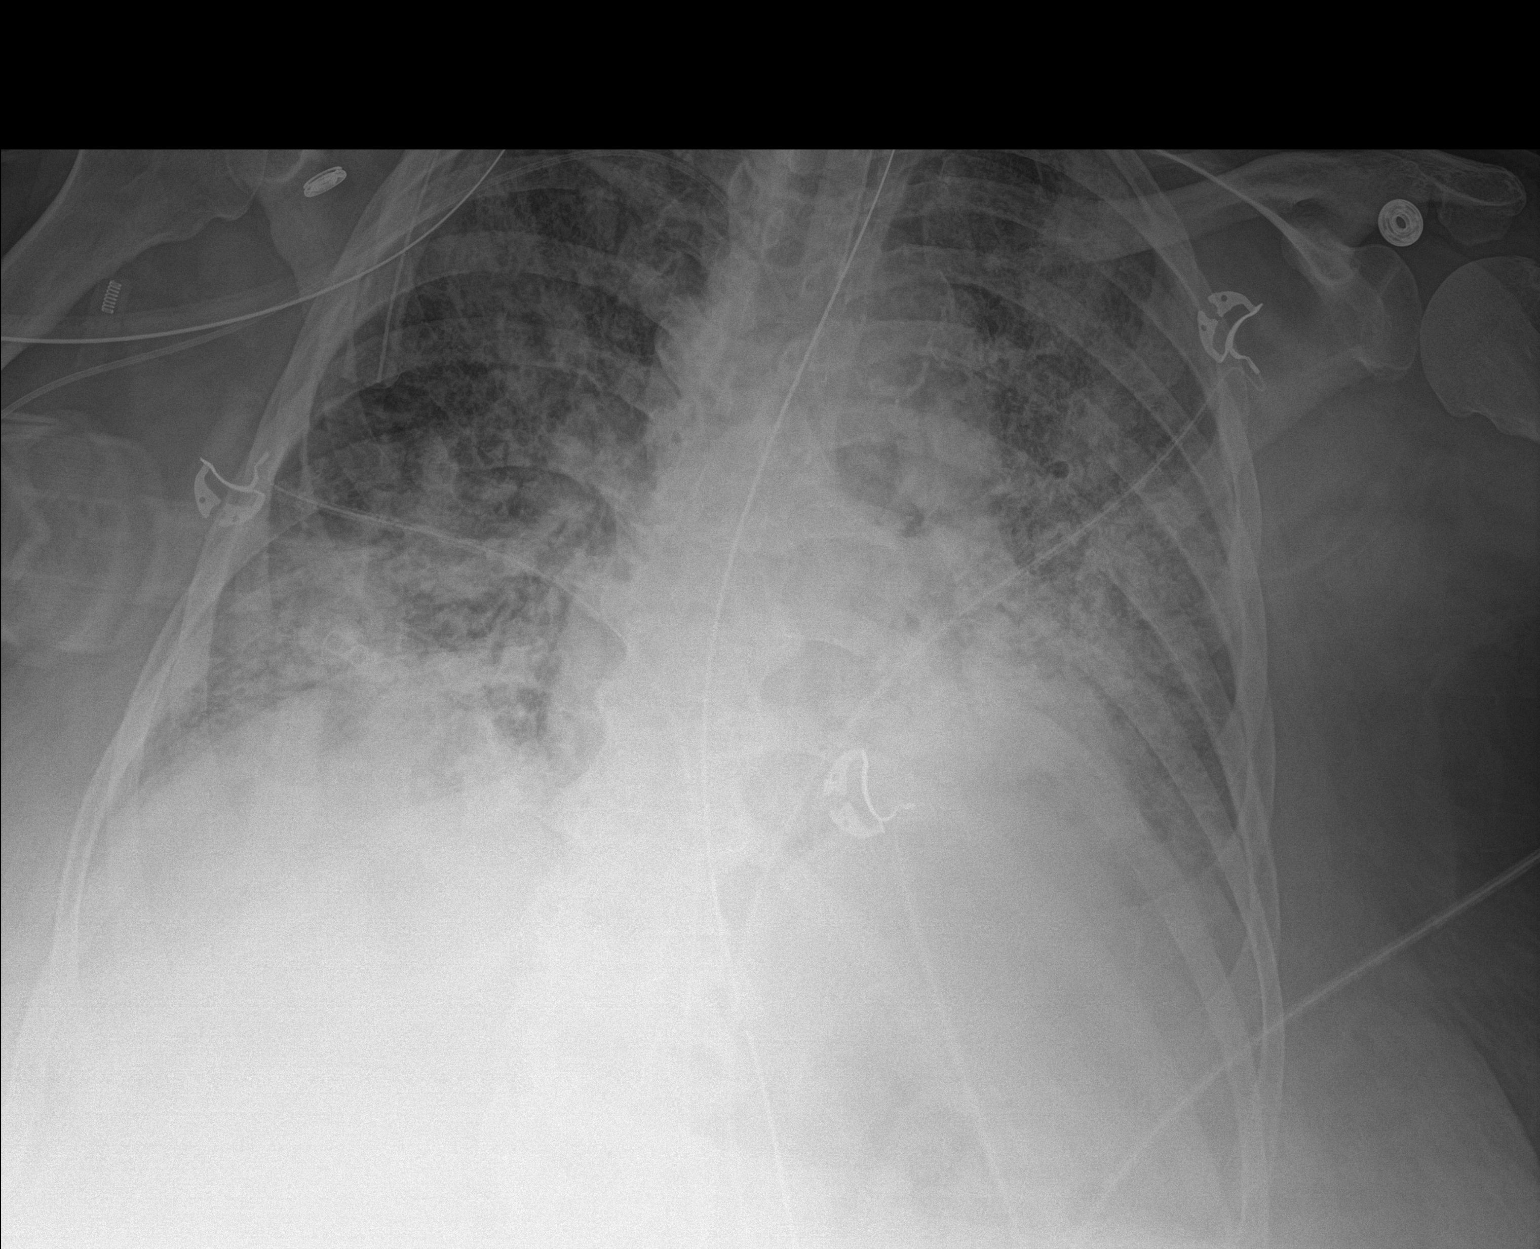

[1 of 1 positions shown; findings below may reference images not displayed]

FINDINGS: The ETT is in good position. The NG tube terminates below today's
film. A right PICC line terminates near the brachiocephalic
confluence, unchanged. No pneumothorax. Diffuse bilateral pulmonary
opacities remain, unchanged, more focal in the bases.
IMPRESSION: 1. Support apparatus as above.
2. Persistent bilateral pulmonary infiltrates, most prominent the
bases, unchanged in the interval.

## 2019-11-22 MED ORDER — IPRATROPIUM-ALBUTEROL 0.5-2.5 (3) MG/3ML IN SOLN
3.0000 mL | Freq: Four times a day (QID) | RESPIRATORY_TRACT | Status: DC
  Administered 2019-11-22 – 2019-11-27 (×22): 3 mL via RESPIRATORY_TRACT
  Filled 2019-11-22 (×22): qty 3

## 2019-11-22 MED ORDER — CISATRACURIUM BOLUS VIA INFUSION
10.0000 mg | Freq: Once | INTRAVENOUS | Status: DC
  Filled 2019-11-22: qty 10

## 2019-11-22 MED ORDER — MIDAZOLAM HCL 2 MG/2ML IJ SOLN
2.0000 mg | Freq: Once | INTRAMUSCULAR | Status: AC
  Administered 2019-11-22: 2 mg via INTRAVENOUS
  Filled 2019-11-22: qty 2

## 2019-11-22 MED ORDER — CISATRACURIUM BESYLATE 20 MG/10ML IV SOLN
10.0000 mg | Freq: Once | INTRAVENOUS | Status: AC
  Administered 2019-11-22: 10 mg via INTRAVENOUS
  Filled 2019-11-22: qty 10

## 2019-11-22 NOTE — Progress Notes (Signed)
eLink Physician-Brief Progress Note Patient Name: Susan [name] DOB: [birth year] MRN: [id number]   Date of Service  11/22/2019  HPI/Events of Note  Ventricular dyssynchrony with hypercarbia and desaturation  eICU Interventions  Versed 2 mg iv x 1 followed by Nimbex 10 mg iv x 1. RR increased to 30, PEEP increased to 14, FIO2 increased to 65 %.        [name] Treyvonne Tata 11/22/2019, 12:33 AM

## 2019-11-22 NOTE — Progress Notes (Signed)
Recheck ABG drawn, critical values called in to Dr. Lucile Shutters.

## 2019-11-22 NOTE — Progress Notes (Signed)
NAME:  Susan [name], MRN:  [id number], DOB:  1960-11-25, LOS: 54 ADMISSION DATE:  11/11/2019, CONSULTATION DATE:  3/10 REFERRING MD:  Sabra Heck, CHIEF COMPLAINT:  Acute encephalopathy and respiratory failure    Brief History   59 year old female smoker admitted 3/10 with overdose complicated by aspiration PNA with MRSA,  Pneumococcal bacteremia, toxic metabolic encephalopathy and septic shock.    Past Medical History  Frequent falls at home,Tobacco abuse, diabetes with painful polyneuropathy, bipolar disease, GERD, HIV, schizophrenia, history of IV drug abuse, seizure disorder.   Significant Hospital Events   3/10 Admit with overdose, intubated on arrival, in shock   3/15 Ventilator asynchrony, heavy sedation & intermittent paralysis   3/16 Lasix drip.  Awaiting TEE.  Sputum w/ MRSA.   Coming back down on sedation, changed to [facility]. TEE showing NO EVIDENCE of endocarditis. ABX changed to pen G per ID. MRSA therapy complete. EF improved. Now up to 50% from 20-25% 3/17 Heavily sedated. Having episodes of bradycardia. Still massively overloaded (>14L), increasing lasix gtt to 8 mg from 4, adding metolazone.  3/19 Lasix to 40 every 12, changing RASS goal to -2, decrease in fentanyl, decreasing PEEP, anticipating trialing pressure support ventilation.  Watching upward trend of white blood cell count and low-grade fever 3/20 BM early am. Agitation early am > sedate post am PT psy meds  Consults:  ID Cards for TEE  Procedures:  ETT 3/10 >>  R Fem TLC (ER) 3/10  R IJ TLC 3/10  L IJ TLC 3/10 >> 3/17  RUE PICC 3/16 >>   Significant Diagnostic Tests:   CT brain 3/10 >> Normal head CT  EEG 3/10 >> Moderate to severe diffuse encephalopathy. 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  Normal variant and no evidence of endocarditis.Trivial TR and trivial PR  Micro Data:  Influenza A/B 3/10 >> negative  COVID 3/10 >> negative  MRSA PCR 3/10 >> positive  BCx2 3/10 >> streptococcus pneumoniae >> S-rocephin BCx1 (aerobic only) 3/10 >> negative  Tracheal aspirate 3/11 >> MRSA BCx2 3/12 >> negative   Antimicrobials:  Vancomycin 3/10 >> 3/16 Unasyn 3/10 >> 3/16 Pen G 3/16 >>   Interim history/subjective:  Very low Vt with pressure control.  Remains on sedation, pressors.  Objective   Blood pressure (!) 116/53, pulse 74, temperature (!) 100.7 F (38.2 C), temperature source Axillary, resp. rate (!) 29, height 5\' 3"  (1.6 m), weight 102.7 kg, SpO2 100 %.    Vent Mode: PCV FiO2 (%):  [50 %-70 %] 60 % Set Rate:  [24 bmp-34 bmp] 34 bmp PEEP:  [10 cmH20-14 cmH20] 10 cmH20 Plateau Pressure:  [20 cmH20-28 cmH20] 25 cmH20   Intake/Output Summary (Last 24 hours) at 11/22/2019 1101 Last data filed at 11/22/2019 0800 Gross per 24 hour  Intake 3084.84 ml  Output 3615 ml  Net -530.16 ml   Filed Weights   11/20/19 0438 11/21/19 0200 11/22/19 0500  Weight: 106 kg 101.5 kg 102.7 kg    Examination:  General - sedated Eyes - pupils reactive ENT - ETT  Cardiac - regular rate/rhythm, no murmur Chest - b/l crackles Abdomen -  soft, non tender, + bowel sounds Extremities - 2+ edema Skin - no rashes Neuro - RASS -3  Resolved Hospital Problem list   Rhabdomyolysis, Elevated INR  Assessment & Plan:   Acute hypoxic/hypercapnic respiratory failure with ARDS from MRSA PNA and aspiration pneumonia. Hx of asthma. - changed back to [facility] 3/21 - goal SpO2 88 to 95% - f/u CXR - continue budesonide, duoneb  Septic shock from MRSA and aspiration pneumonia, and Pneumococcal bacteremia. - pressors to keep  MAP > 65 - ID s/o 3/20 - continue penicillin through 3/23 to compete 14 day course - completed ABx for MRSA HCAP 3/16  Hx of HIV. - continue descovy, tivicay  Acute systolic heart failure with sepsis induced CM. Hx of HTN, HLD. - hold beta blocker, clonidine, norvasc, vasotec, maxide - continue crestor  Acute metabolic encephalopathy 2nd to sepsis, hypoxia. Hx of schizophrenia, seizures. - RASS goal -1 to -2 - continue cogentin, buspar, doxepin, neurontin, zoloft  Acute Kidney Injury 2nd to ATN in setting of septic shock and hypoxia. Hypokalemia. - baseline creatinine 0.78 from 06/29/19 - optimize hemodynamics - f/u BMET - no indication for renal replacement at this time  Constipation. -continue bowel regimen   DM type II poorly controlled with hyperglycemia. - HbA1C 12.3 from 11/11/19 - resistant scale SSI with lantus 10 units bid and 12 units TF coverage   Best practice:  Diet: tube feeds.   DVT prophylaxis: SQ heparin GI prophylaxis: protonix Mobility: BR Code Status: full code  Family Communication: Daughter updated via phone 3/20. Patient's visitor is listed as sister but is actually just a friend. Daughter is DPOA. Disposition: to ICU   Labs:   CMP Latest Ref Rng & Units 11/22/2019 11/22/2019 11/22/2019  Glucose 70 - 99 mg/dL - 139(H) -  BUN 6 - 20 mg/dL - 38(H) -  Creatinine 0.44 - 1.00 mg/dL - 2.50(H) -  Sodium 135 - 145 mmol/L 138 141 138  Potassium 3.5 - 5.1 mmol/L 3.5 3.7 3.4(L)  Chloride 98 - 111 mmol/L - 88(L) -  CO2 22 - 32 mmol/L - 40(H) -  Calcium 8.9 - 10.3 mg/dL - 8.5(L) -  Total Protein 6.5 - 8.1 g/dL - - -  Total Bilirubin 0.3 - 1.2 mg/dL - - -  Alkaline Phos 38 - 126 U/L - - -  AST 15 - 41 U/L - - -  ALT 0 - 44 U/L - - -    CBC Latest Ref Rng & Units 11/22/2019 11/22/2019 11/22/2019  WBC 4.0 - 10.5 K/uL - 16.0(H) -  Hemoglobin 12.0 - 15.0 g/dL 11.6(L) 10.7(L) 10.9(L)  Hematocrit 36.0 - 46.0 % 34.0(L) 35.1(L) 32.0(L)  Platelets 150 - 400  K/uL - 221 -    ABG    Component Value Date/Time   PHART 7.271 (L) 11/22/2019 0551   PCO2ART 96.4 (HH) 11/22/2019 0551   PO2ART 112.0 (H) 11/22/2019 0551   HCO3 43.9 (H) 11/22/2019 0551   TCO2 47 (H) 11/22/2019 0551   ACIDBASEDEF 4.0 (H) 11/12/2019 1504   O2SAT 97.0 11/22/2019 0551    CBG (last 3)  Recent Labs    11/22/19 0001 11/22/19 0415 11/22/19 0754  GLUCAP 196* 132* 151*    CC time 36 minutes  Chesley Mires, MD [place] 11/22/2019, 11:19 AM

## 2019-11-22 NOTE — Progress Notes (Signed)
Recheck ABG drawn, critical values called to E-link RN.

## 2019-11-22 NOTE — Progress Notes (Signed)
[facility] Progress Note Patient Name: Susan [name] DOB: 12-12-1960 MRN: [id number]   Date of Service  11/22/2019  HPI/Events of Note  Pt now more sedate and minimally dyssynchronous on the ventilator.  eICU Interventions  Hold Versed + Nimbex, ABG at 3 a.m.        [name] [name] 11/22/2019, 12:48 AM

## 2019-11-23 ENCOUNTER — Inpatient Hospital Stay (HOSPITAL_COMMUNITY): Payer: Medicare Other

## 2019-11-23 DIAGNOSIS — B953 Streptococcus pneumoniae as the cause of diseases classified elsewhere: Secondary | ICD-10-CM | POA: Diagnosis not present

## 2019-11-23 DIAGNOSIS — J9601 Acute respiratory failure with hypoxia: Secondary | ICD-10-CM | POA: Diagnosis not present

## 2019-11-23 DIAGNOSIS — J690 Pneumonitis due to inhalation of food and vomit: Secondary | ICD-10-CM | POA: Diagnosis not present

## 2019-11-23 DIAGNOSIS — R7881 Bacteremia: Secondary | ICD-10-CM | POA: Diagnosis not present

## 2019-11-23 LAB — BASIC METABOLIC PANEL
Anion gap: 15 (ref 5–15)
BUN: 43 mg/dL — ABNORMAL HIGH (ref 6–20)
CO2: 36 mmol/L — ABNORMAL HIGH (ref 22–32)
Calcium: 8 mg/dL — ABNORMAL LOW (ref 8.9–10.3)
Chloride: 86 mmol/L — ABNORMAL LOW (ref 98–111)
Creatinine, Ser: 2.48 mg/dL — ABNORMAL HIGH (ref 0.44–1.00)
GFR calc Af Amer: 24 mL/min — ABNORMAL LOW (ref >60)
GFR calc non Af Amer: 21 mL/min — ABNORMAL LOW (ref >60)
Glucose, Bld: 183 mg/dL — ABNORMAL HIGH (ref 70–99)
Potassium: 2.9 mmol/L — ABNORMAL LOW (ref 3.5–5.1)
Sodium: 137 mmol/L (ref 135–145)

## 2019-11-23 LAB — GLUCOSE, CAPILLARY
Glucose-Capillary: 131 mg/dL — ABNORMAL HIGH (ref 70–99)
Glucose-Capillary: 142 mg/dL — ABNORMAL HIGH (ref 70–99)
Glucose-Capillary: 166 mg/dL — ABNORMAL HIGH (ref 70–99)
Glucose-Capillary: 171 mg/dL — ABNORMAL HIGH (ref 70–99)
Glucose-Capillary: 173 mg/dL — ABNORMAL HIGH (ref 70–99)
Glucose-Capillary: 177 mg/dL — ABNORMAL HIGH (ref 70–99)
Glucose-Capillary: 188 mg/dL — ABNORMAL HIGH (ref 70–99)

## 2019-11-23 LAB — CBC
HCT: 30.6 % — ABNORMAL LOW (ref 36.0–46.0)
Hemoglobin: 9.6 g/dL — ABNORMAL LOW (ref 12.0–15.0)
MCH: 27 pg (ref 26.0–34.0)
MCHC: 31.4 g/dL (ref 30.0–36.0)
MCV: 86 fL (ref 80.0–100.0)
Platelets: 260 10*3/uL (ref 150–400)
RBC: 3.56 MIL/uL — ABNORMAL LOW (ref 3.87–5.11)
RDW: 15.4 % (ref 11.5–15.5)
WBC: 12.6 10*3/uL — ABNORMAL HIGH (ref 4.0–10.5)
nRBC: 0 % (ref 0.0–0.2)

## 2019-11-23 IMAGING — DX DG CHEST 1V PORT
1 series · 1 of 1 positions shown · non-contrast
Comparison: [DATE].

CLINICAL DATA: Respiratory failure

EXAM:
PORTABLE CHEST 1 VIEW

[chest ap]
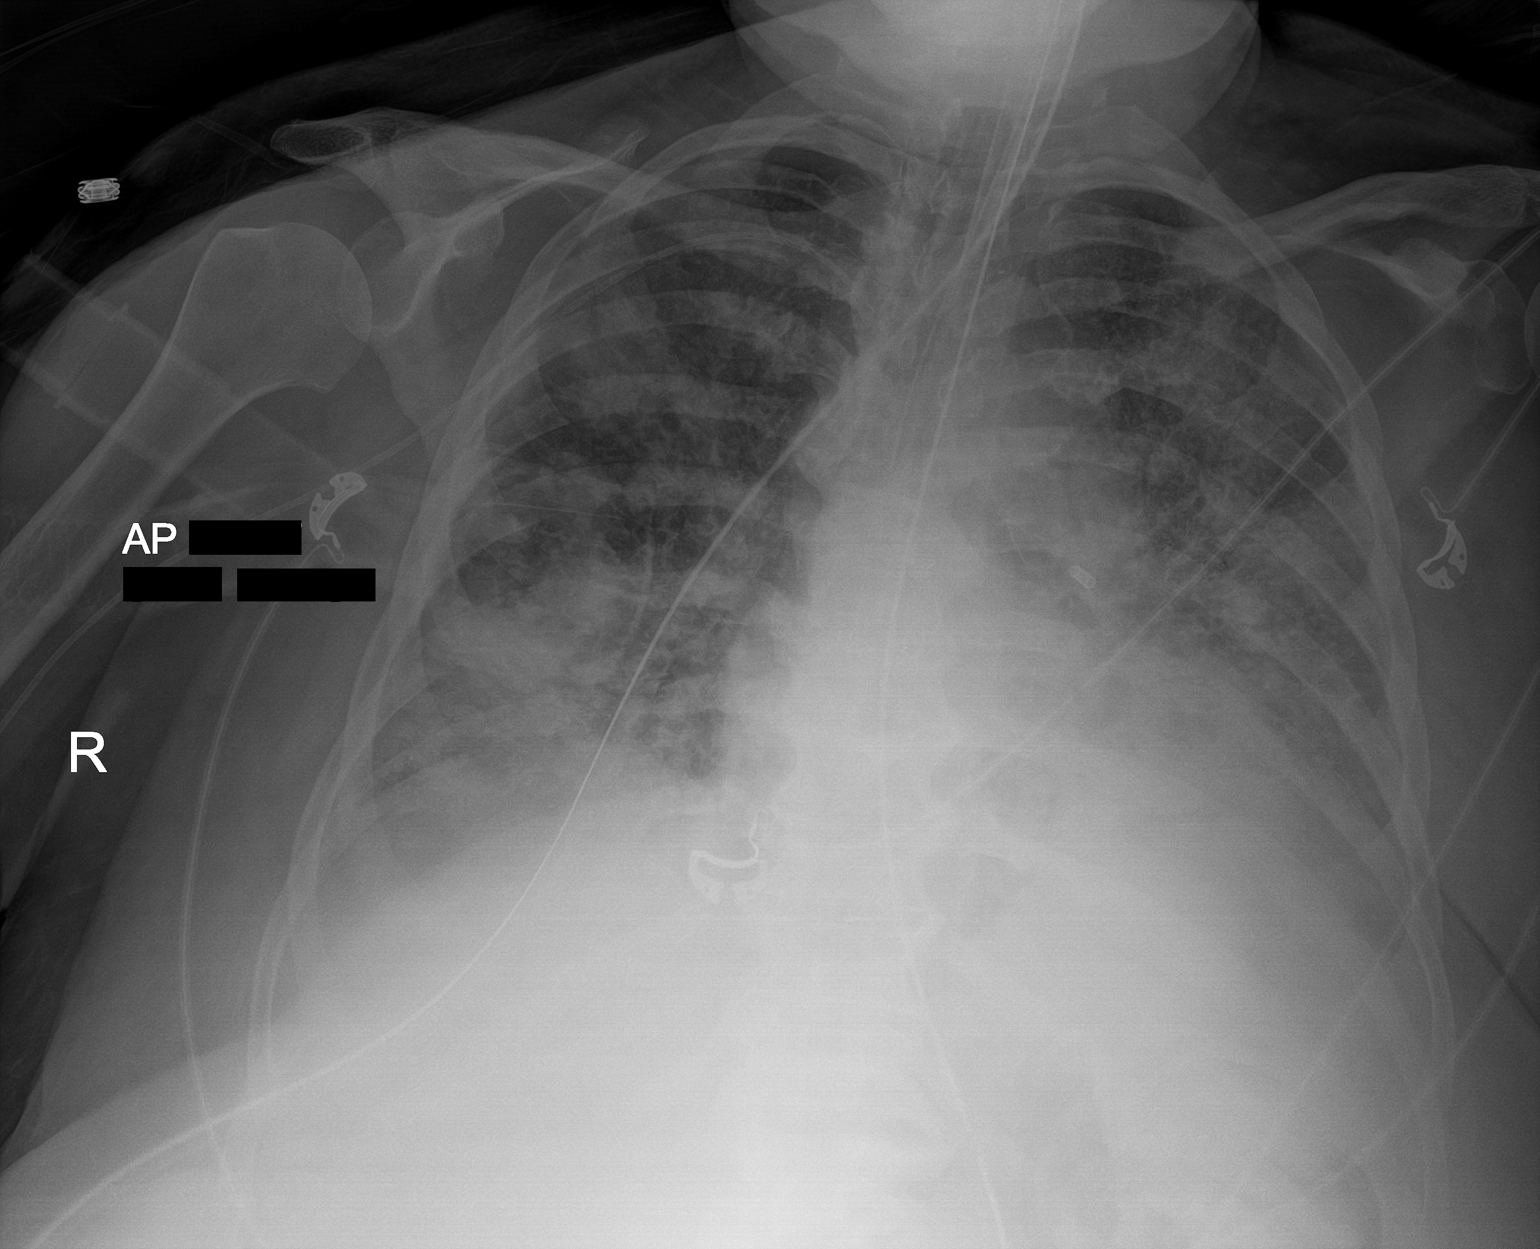

[1 of 1 positions shown; findings below may reference images not displayed]

FINDINGS: Endotracheal tube tip is at the carina. Nasogastric tube tip and
side port are below the diaphragm with side port seen in the
stomach. No pneumothorax. There is multifocal airspace opacity
throughout the lungs, most notably in the right lower lung region.
Heart size and pulmonary vascularity are normal. No adenopathy. No
bone lesions.
IMPRESSION: Tube positions as described without pneumothorax. Note that the
endotracheal tube is at the carina; advise withdrawing endotracheal
tube approximately 3 cm.

Widespread airspace opacity persists bilaterally. Suspect multifocal
pneumonia. There may be a degree of superimposed edema. Stable
cardiac silhouette.

These results will be called to the ordering clinician or
representative by the Radiologist Assistant, and communication
documented in the PACS or [REDACTED].

## 2019-11-23 MED ORDER — POTASSIUM CHLORIDE 20 MEQ/15ML (10%) PO SOLN
40.0000 meq | ORAL | Status: AC
  Administered 2019-11-23 (×2): 40 meq via ORAL
  Filled 2019-11-23 (×2): qty 30

## 2019-11-23 MED ORDER — FUROSEMIDE 10 MG/ML IJ SOLN
40.0000 mg | Freq: Once | INTRAMUSCULAR | Status: AC
  Administered 2019-11-23: 40 mg via INTRAVENOUS
  Filled 2019-11-23: qty 4

## 2019-11-23 NOTE — Consult Note (Signed)
WOC Nurse Consult Note: Patient receiving care at [facility] [phone] patient sedated and intubated, RN at bedside during assessment. Reason for Consult:MASD under breast folds. Wound type:Patient has MASD under the left breast fold measuring 10.5 cm x 0.8 cm x 0.1 cm with a large amount of moisture present to the wound, no odor present.  Order placed to apply foam to wounds located under left and right breast folds.  Change every three days or PRN for soilage.    Patient has MASD under the right breast fold measuring 11.0 cm x 0.4 cm x 0.1 cm with a large amount of moisture present to the wound, no odor present.   Order placed to apply foam to wounds located under left and right breast folds.  Change every three days or PRN for soilage.  Order for InterDry placed to apply under the abdominal folds and breast folds of both the left and right breasts.    Monitor the wound area's for worsening of condition such as, Signs/symptoms of infection, Increase in size, development of or worsening of odor, development of pain, or increased pain at the affected locations.  Notify the medical team if any of these develop.  Thank you for the consult.  Discussed plan of care with the bedside nurse.  [facility] nurse will not follow at this time.  Please re-consult the [facility] if needed.    Austin Miles, RN, [employer]

## 2019-11-23 NOTE — Progress Notes (Signed)
RT drew ABG at shift change d/t changes made earlier with no ABG result.  No changes made since ABG was obtained.  No further ABG was obtained at this time. RT will continue to monitor.

## 2019-11-23 NOTE — Progress Notes (Signed)
ETT pulled back 3cm per MD order. ETT now 19 at lip.

## 2019-11-23 NOTE — Progress Notes (Signed)
NAME:  Susan [name], MRN:  [id number], DOB:  April 11, 1961, LOS: 46 ADMISSION DATE:  11/11/2019, CONSULTATION DATE:  3/10 REFERRING MD:  Sabra Heck, CHIEF COMPLAINT:  Acute encephalopathy and respiratory failure    Brief History   59 year old female smoker admitted 3/10 with overdose complicated by aspiration PNA with MRSA,  Pneumococcal bacteremia, toxic metabolic encephalopathy and septic shock.    Past Medical History  Frequent falls at home,Tobacco abuse, diabetes with painful polyneuropathy, bipolar disease, GERD, HIV, schizophrenia, history of IV drug abuse, seizure disorder.   Significant Hospital Events   3/10 Admit with overdose, intubated on arrival, in shock   3/15 Ventilator asynchrony, heavy sedation & intermittent paralysis   3/16 Lasix drip.  Awaiting TEE.  Sputum w/ MRSA.   Coming back down on sedation, changed to [facility]. TEE showing NO EVIDENCE of endocarditis. ABX changed to pen G per ID. MRSA therapy complete. EF improved. Now up to 50% from 20-25% 3/17 Heavily sedated. Having episodes of bradycardia. Still massively overloaded (>14L), increasing lasix gtt to 8 mg from 4, adding metolazone.  3/19 Lasix to 40 every 12, changing RASS goal to -2, decrease in fentanyl, decreasing PEEP, anticipating trialing pressure support ventilation.  Watching upward trend of white blood cell count and low-grade fever 3/20 BM early am. Agitation early am > sedate post am PT psy meds  Consults:  ID Cards for TEE  Procedures:  ETT 3/10 >>  R Fem TLC (ER) 3/10  R IJ TLC 3/10  L IJ TLC 3/10 >> 3/17  RUE PICC 3/16 >>   Significant Diagnostic Tests:   CT brain 3/10 >> Normal head CT  EEG 3/10 >> Moderate to severe diffuse encephalopathy. 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  Normal variant and no evidence of endocarditis.Trivial TR and trivial PR  Micro Data:  Influenza A/B 3/10 >> negative  COVID 3/10 >> negative  MRSA PCR 3/10 >> positive  BCx2 3/10 >> streptococcus pneumoniae >> S-rocephin BCx1 (aerobic only) 3/10 >> negative  Tracheal aspirate 3/11 >> MRSA BCx2 3/12 >> negative   Antimicrobials:  Vancomycin 3/10 >> 3/16 Unasyn 3/10 >> 3/16 Pen G 3/16 >>   Interim history/subjective:  Lying in bed, RN reports no acute issues overnight   Objective   Blood pressure (!) 104/57, pulse 75, temperature 98.6 F (37 C), temperature source Axillary, resp. rate (!) 22, height 5\' 3"  (1.6 m), weight 105.6 kg, SpO2 100 %.    Vent Mode: PRVC FiO2 (%):  [40 %-60 %] 60 % Set Rate:  [22 bmp] 22 bmp Vt Set:  [420 mL] 420 mL PEEP:  [8 cmH20] 8 cmH20 Plateau Pressure:  [24 cmH20-32 cmH20] 24 cmH20   Intake/Output Summary (Last 24 hours) at 11/23/2019 0944 Last data filed at 11/23/2019 0800 Gross per 24 hour  Intake 5152.46 ml  Output 2425 ml  Net 2727.46 ml   Filed Weights   11/21/19 0200 11/22/19 0500 11/23/19 0500  Weight: 101.5 kg 102.7 kg 105.6 kg    Examination: General: Chronically ill appearing elderly female on mechanical ventilation, in NAD HEENT: ETT, MM pink/moist, PERRL,  Neuro: Sedated on vent  will open eyes to verbal stimuli, non-focal  CV: s1s2 regular rate and rhythm, no murmur, rubs, or gallops,  PULM:  Bilateral lower lobe crackles, no increase work of breathing, Oxygne saturations 97-100 on 60% FiO2  GI: soft, bowel sounds active in all 4 quadrants, non-tender, non-distended, tolerating TF Extremities: warm/dry, Generalized edema  Skin: no rashes or lesions  Resolved Hospital Problem list   Rhabdomyolysis  Elevated INR Septic shock   Assessment & Plan:    Acute hypoxic/hypercapnic respiratory failure with ARDS from MRSA PNA and aspiration pneumonia. Hx of asthma. Plan: Continue ventilator support with lung protective strategies  Wean sedation and trial SBT today, mentation preclude extubation  Wean PEEP and FiO2 for sats greater than 88%. Head of bed elevated 30 degrees. Plateau pressures less than 30 cm H20.  Follow intermittent chest x-ray and ABG.   Ensure adequate pulmonary hygiene  Follow cultures  VAP bundle in place  PAD protocol Continue diuretics   MRSA and aspiration pneumonia, and Pneumococcal bacteremia -Completed ABx for MRSA HCAP 3/16 Plan: ID s/o as of 3/20 Continue Penicillin through 3/23 to complete 14 days  Hx of HIV Plan: Continue home Descovy and Tivicay   Acute systolic heart failure with sepsis induced CM. Hx of HTN, HLD. Plan: Home Beta blocker. Clonidine, Norvasc, Vasotec, and Maxide on hold, resume when approp Continue Crestor   Acute metabolic encephalopathy 2nd to sepsis, hypoxia. Hx of schizophrenia, seizures. Plan: RASS goal 0 to -1 Continue Cogentin, Buspar, Doxepin, Neurotin, and Zoloft  Wean IV sedation as able  Delirium precautions   Acute Kidney Injury 2nd to ATN in setting of septic shock and hypoxia. Hypokalemia. - baseline creatinine 0.78 from 06/29/19 Plan: Follow renal function / urine output Trend Bmet Avoid nephrotoxins, ensure adequate renal perfusion Gentle diuresing  Supplement potassium  Constipation. Plan: Continue Bowel regiment   DM type II poorly controlled with hyperglycemia. - HbA1C 12.3 from 11/11/19 Plan: Resistant SSI  Long acting insulin  Tube feed coverage   Best practice:  Diet: tube feeds.   DVT prophylaxis: SQ heparin GI prophylaxis: protonix Mobility: BR Code Status: full code  Family Communication: Daughter Aquinna Drum over the phone 11/23/2019 Disposition: to ICU   Labs:   CMP Latest Ref Rng & Units 11/23/2019 11/22/2019 11/22/2019   Glucose 70 - 99 mg/dL 183(H) - -  BUN 6 - 20 mg/dL 43(H) - -  Creatinine 0.44 - 1.00 mg/dL 2.48(H) - -  Sodium 135 - 145 mmol/L 137 137 138  Potassium 3.5 - 5.1 mmol/L 2.9(L) 3.1(L) 3.5  Chloride 98 - 111 mmol/L 86(L) - -  CO2 22 - 32 mmol/L 36(H) - -  Calcium 8.9 - 10.3 mg/dL 8.0(L) - -  Total Protein 6.5 - 8.1 g/dL - - -  Total Bilirubin 0.3 - 1.2 mg/dL - - -  Alkaline Phos 38 - 126 U/L - - -  AST 15 - 41 U/L - - -  ALT 0 - 44 U/L - - -    CBC Latest Ref Rng & Units 11/23/2019 11/22/2019 11/22/2019  WBC 4.0 - 10.5 K/uL 12.6(H) - -  Hemoglobin 12.0 - 15.0 g/dL 9.6(L) 10.2(L) 11.6(L)  Hematocrit 36.0 - 46.0 % 30.6(L) 30.0(L) 34.0(L)  Platelets 150 - 400 K/uL 260 - -    ABG    Component Value Date/Time   PHART 7.390 11/22/2019 2027   PCO2ART 70.6 (HH) 11/22/2019 2027   PO2ART 50.0 (L) 11/22/2019 2027   HCO3 42.6 (H) 11/22/2019 2027   TCO2  45 (H) 11/22/2019 2027   ACIDBASEDEF 4.0 (H) 11/12/2019 1504   O2SAT 81.0 11/22/2019 2027    CBG (last 3)  Recent Labs    11/23/19 0015 11/23/19 0423 11/23/19 0823  GLUCAP 166* 173* 188*    CRITICAL CARE Performed by: Johnsie Cancel  Total critical care time: 40 minutes  Critical care time was exclusive of separately billable procedures and treating other patients.  Critical care was necessary to treat or prevent imminent or life-threatening deterioration.  Critical care was time spent personally by me on the following activities: development of treatment plan with patient and/or surrogate as well as nursing, discussions with consultants, evaluation of patient's response to treatment, examination of patient, obtaining history from patient or surrogate, ordering and performing treatments and interventions, ordering and review of laboratory studies, ordering and review of radiographic studies, pulse oximetry and re-evaluation of patient's condition.  Johnsie Cancel, NP-[facility] Contact / Pager  information can be found on Amion  11/23/2019, 10:26 AM

## 2019-11-24 ENCOUNTER — Inpatient Hospital Stay (HOSPITAL_COMMUNITY): Payer: Medicare Other

## 2019-11-24 DIAGNOSIS — N170 Acute kidney failure with tubular necrosis: Secondary | ICD-10-CM | POA: Diagnosis not present

## 2019-11-24 DIAGNOSIS — I639 Cerebral infarction, unspecified: Secondary | ICD-10-CM | POA: Diagnosis not present

## 2019-11-24 DIAGNOSIS — G9340 Encephalopathy, unspecified: Secondary | ICD-10-CM | POA: Diagnosis not present

## 2019-11-24 DIAGNOSIS — J9601 Acute respiratory failure with hypoxia: Secondary | ICD-10-CM | POA: Diagnosis not present

## 2019-11-24 DIAGNOSIS — I63413 Cerebral infarction due to embolism of bilateral middle cerebral arteries: Secondary | ICD-10-CM

## 2019-11-24 DIAGNOSIS — I6340 Cerebral infarction due to embolism of unspecified cerebral artery: Secondary | ICD-10-CM | POA: Insufficient documentation

## 2019-11-24 DIAGNOSIS — Z8673 Personal history of transient ischemic attack (TIA), and cerebral infarction without residual deficits: Secondary | ICD-10-CM | POA: Insufficient documentation

## 2019-11-24 LAB — LIPID PANEL
Cholesterol: 99 mg/dL (ref 0–200)
HDL: 14 mg/dL — ABNORMAL LOW (ref >40)
LDL Cholesterol: 52 mg/dL (ref 0–99)
Total CHOL/HDL Ratio: 7.1 RATIO
Triglycerides: 166 mg/dL — ABNORMAL HIGH (ref <150)
VLDL: 33 mg/dL (ref 0–40)

## 2019-11-24 LAB — CBC WITH DIFFERENTIAL/PLATELET
Abs Immature Granulocytes: 0.15 10*3/uL — ABNORMAL HIGH (ref 0.00–0.07)
Basophils Absolute: 0.1 10*3/uL (ref 0.0–0.1)
Basophils Relative: 0 %
Eosinophils Absolute: 0.3 10*3/uL (ref 0.0–0.5)
Eosinophils Relative: 2 %
HCT: 30.5 % — ABNORMAL LOW (ref 36.0–46.0)
Hemoglobin: 9.7 g/dL — ABNORMAL LOW (ref 12.0–15.0)
Immature Granulocytes: 1 %
Lymphocytes Relative: 23 %
Lymphs Abs: 3.3 10*3/uL (ref 0.7–4.0)
MCH: 27.2 pg (ref 26.0–34.0)
MCHC: 31.8 g/dL (ref 30.0–36.0)
MCV: 85.7 fL (ref 80.0–100.0)
Monocytes Absolute: 0.8 10*3/uL (ref 0.1–1.0)
Monocytes Relative: 5 %
Neutro Abs: 10.2 10*3/uL — ABNORMAL HIGH (ref 1.7–7.7)
Neutrophils Relative %: 69 %
Platelets: 300 10*3/uL (ref 150–400)
RBC: 3.56 MIL/uL — ABNORMAL LOW (ref 3.87–5.11)
RDW: 15.2 % (ref 11.5–15.5)
WBC: 14.8 10*3/uL — ABNORMAL HIGH (ref 4.0–10.5)
nRBC: 0 % (ref 0.0–0.2)

## 2019-11-24 LAB — GLUCOSE, CAPILLARY
Glucose-Capillary: 139 mg/dL — ABNORMAL HIGH (ref 70–99)
Glucose-Capillary: 145 mg/dL — ABNORMAL HIGH (ref 70–99)
Glucose-Capillary: 153 mg/dL — ABNORMAL HIGH (ref 70–99)
Glucose-Capillary: 177 mg/dL — ABNORMAL HIGH (ref 70–99)

## 2019-11-24 LAB — BASIC METABOLIC PANEL
Anion gap: 10 (ref 5–15)
BUN: 44 mg/dL — ABNORMAL HIGH (ref 6–20)
CO2: 35 mmol/L — ABNORMAL HIGH (ref 22–32)
Calcium: 7.7 mg/dL — ABNORMAL LOW (ref 8.9–10.3)
Chloride: 89 mmol/L — ABNORMAL LOW (ref 98–111)
Creatinine, Ser: 2.13 mg/dL — ABNORMAL HIGH (ref 0.44–1.00)
GFR calc Af Amer: 29 mL/min — ABNORMAL LOW (ref >60)
GFR calc non Af Amer: 25 mL/min — ABNORMAL LOW (ref >60)
Glucose, Bld: 292 mg/dL — ABNORMAL HIGH (ref 70–99)
Potassium: 3.8 mmol/L (ref 3.5–5.1)
Sodium: 134 mmol/L — ABNORMAL LOW (ref 135–145)

## 2019-11-24 LAB — MAGNESIUM: Magnesium: 2.2 mg/dL (ref 1.7–2.4)

## 2019-11-24 LAB — VITAMIN B12: Vitamin B-12: 1476 pg/mL — ABNORMAL HIGH (ref 180–914)

## 2019-11-24 IMAGING — CT CT HEAD W/O CM
4 series · 17 of 47 positions shown, 19 images · non-contrast
Comparison: Thirteen days ago

CLINICAL DATA: Acute stroke suspected

EXAM:
CT HEAD WITHOUT CONTRAST
TECHNIQUE: Contiguous axial images were obtained from the base of the skull
through the vertex without intravenous contrast.

[Series 3: head without · axial · non-contrast · 0.43mm/px · z∈[-114,+6]mm · 7 of 33 slices shown, 9 images]
[im 5/33  brain]
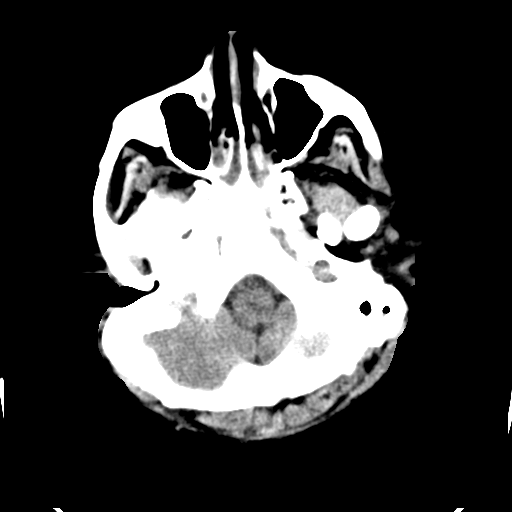
[im 5/33  bone]
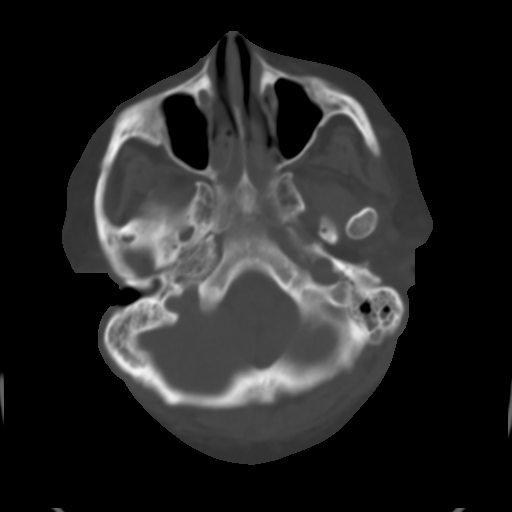
[im 9/33  brain]
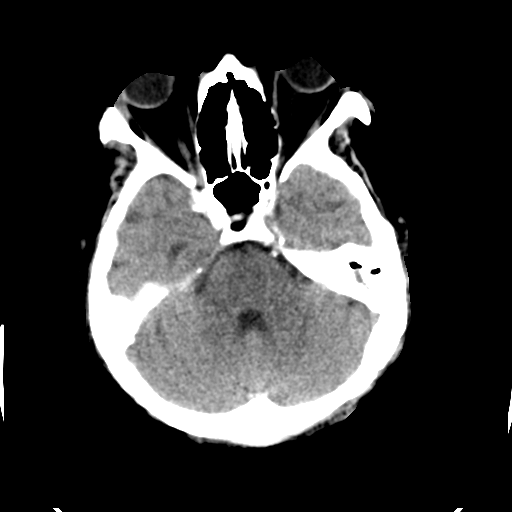
[im 13/33  brain]
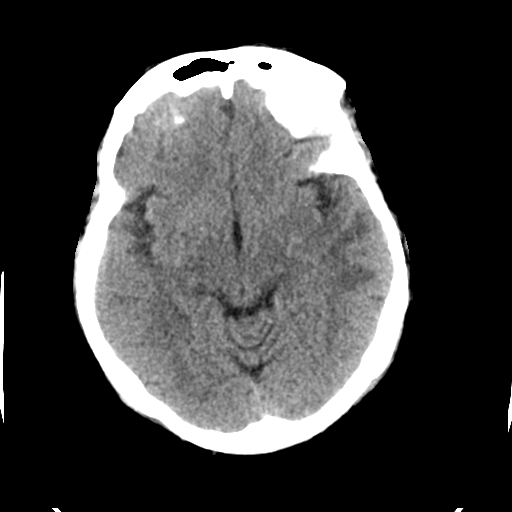
[im 17/33  brain]
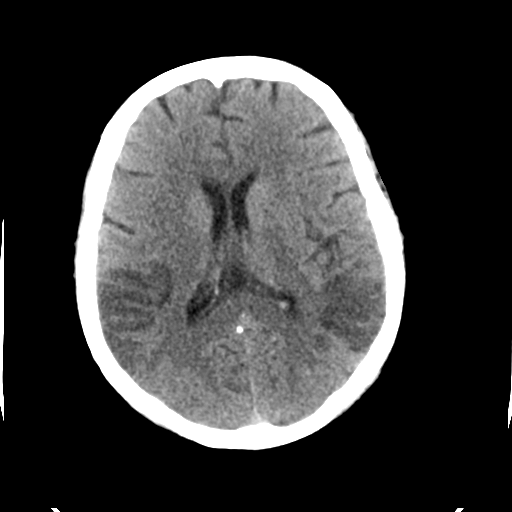
[im 21/33  brain]
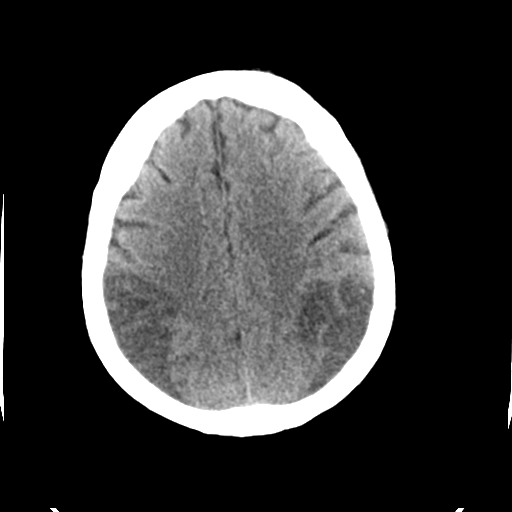
[im 21/33  bone]
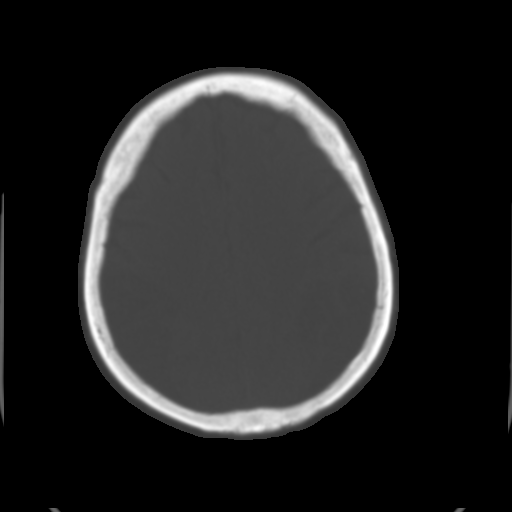
[im 25/33  brain]
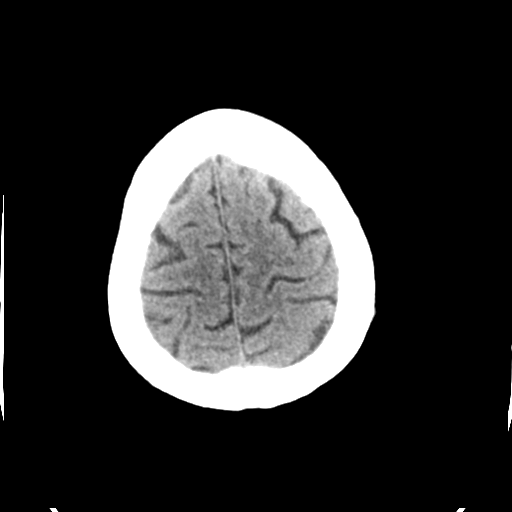
[im 29/33  brain]
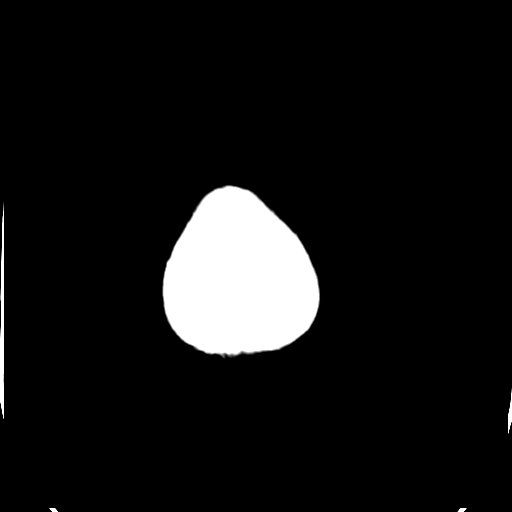

[Series 4: head bone · axial · 0.43mm/px · z∈[-118,-62]mm · 4 of 82 slices shown]
[im 9/82  bone]
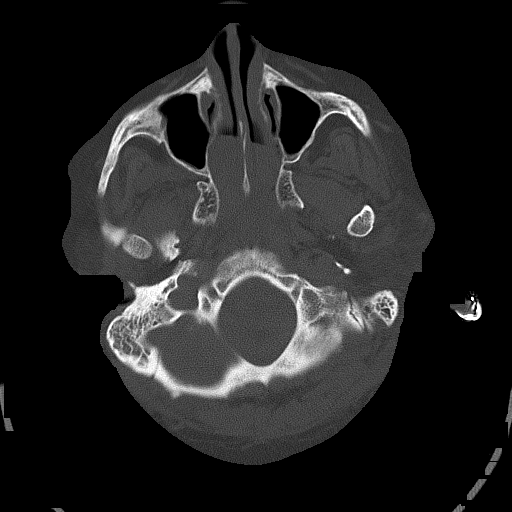
[im 17/82  bone]
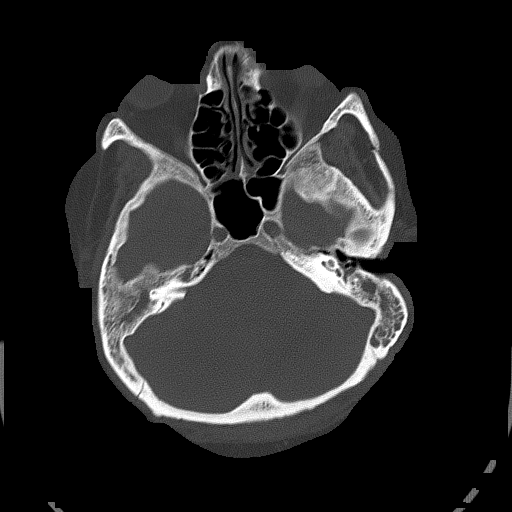
[im 25/82  bone]
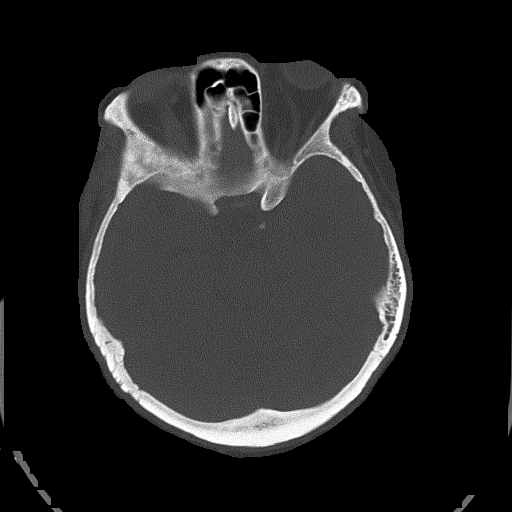
[im 37/82  bone]
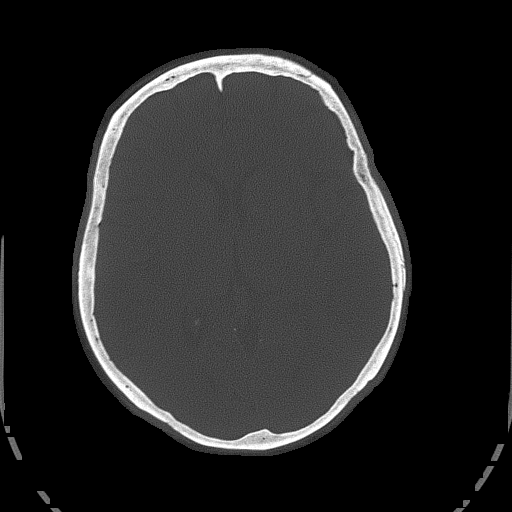

[Series 5: head without cor · coronal · non-contrast · 0.33mm/px · 3 of 70 slices shown]
[im 24/70  brain]
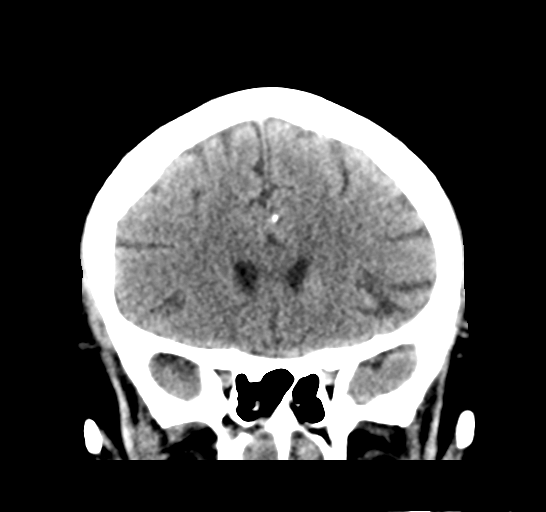
[im 31/70  brain]
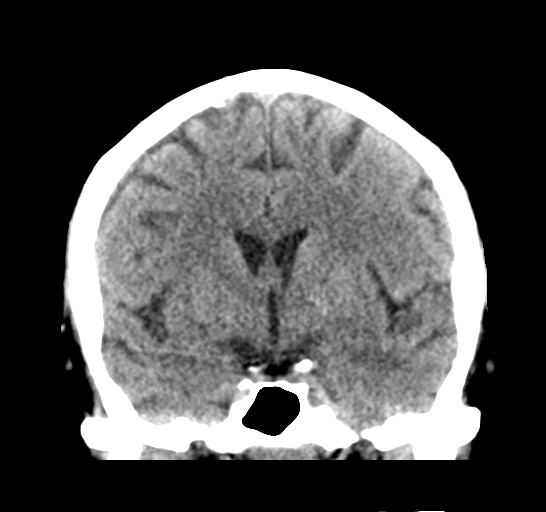
[im 39/70  brain]
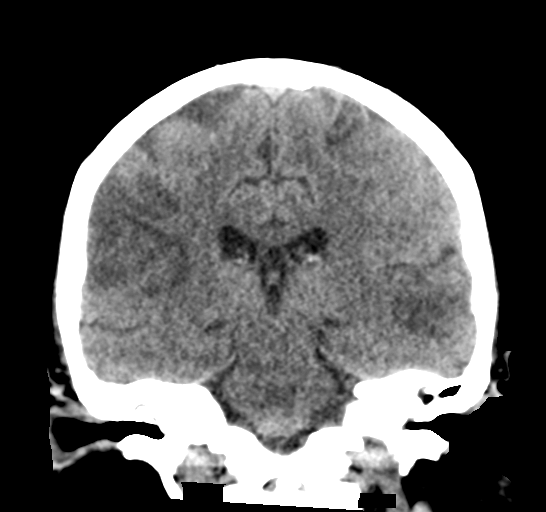

[Series 6: head without sag · sagittal · non-contrast · 0.33mm/px · 3 of 62 slices shown]
[im 21/62  brain]
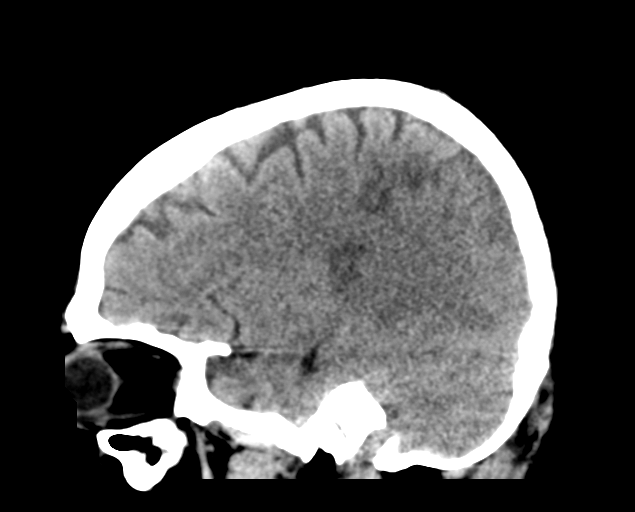
[im 31/62  brain]
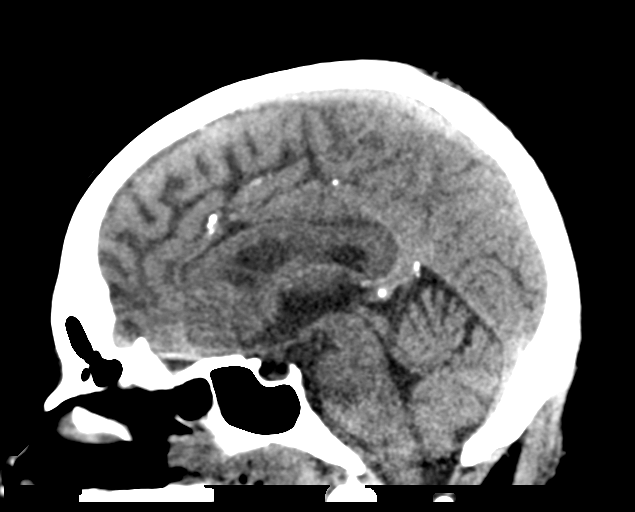
[im 41/62  brain]
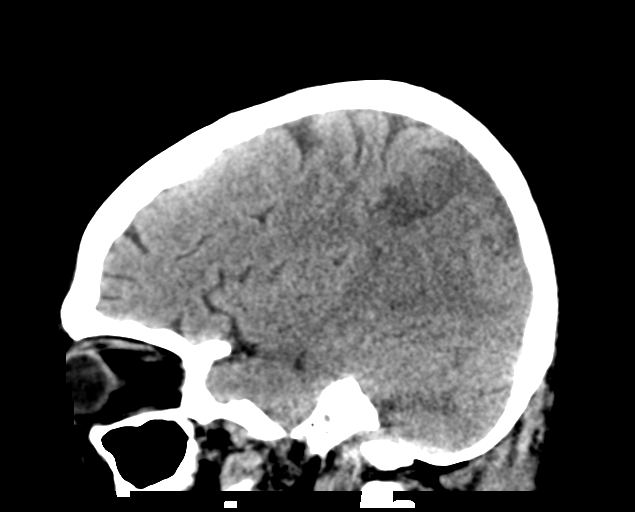

[17 of 47 positions shown; findings below may reference images not displayed]

FINDINGS: Brain: Large areas of acute or subacute appearing infarct are seen
in the bilateral posterior frontal, parietal, and left posterior
temporal regions. There may be some petechial hemorrhage along the
cortex. No associated mass effect or hematoma. No hydrocephalus.

Vascular: No hyperdense vessel.

Skull: Negative

Sinuses/Orbits: Bilateral mastoid opacification. Nasopharynx is
opacified in the setting of intubation.

Call has been placed to the ordering provider.  Reference [REDACTED].
IMPRESSION: Acute or subacute bilateral MCA territory infarcts that are large.
Mild petechial hemorrhage is likely present.

## 2019-11-24 IMAGING — MR MR MRA HEAD W/O CM
1 series · 20 of 48 positions shown · non-contrast
Comparison: MRI head [DATE]

CLINICAL DATA: Stroke.

EXAM:
MRA HEAD WITHOUT CONTRAST
TECHNIQUE: Angiographic images of the Circle of Willis were obtained using MRA
technique without intravenous contrast.

[Series 2: ax (id) · axial · 1.0mm · 0.43mm/px · z∈[-76,+9]mm · 20 of 184 slices shown]
[im 1/184]
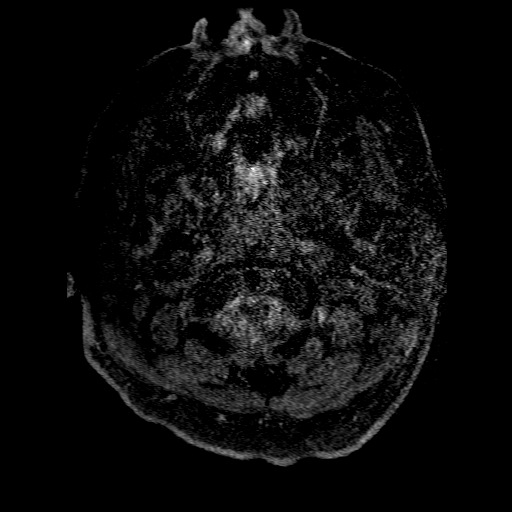
[im 4/184]
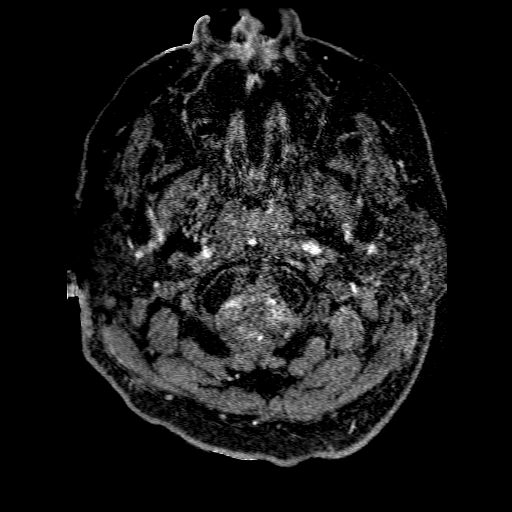
[im 8/184]
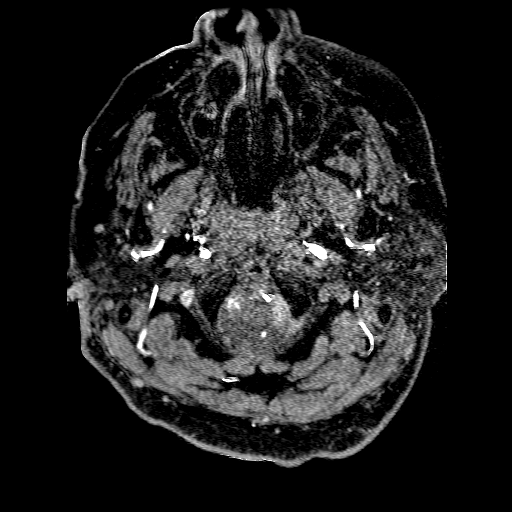
[im 12/184]
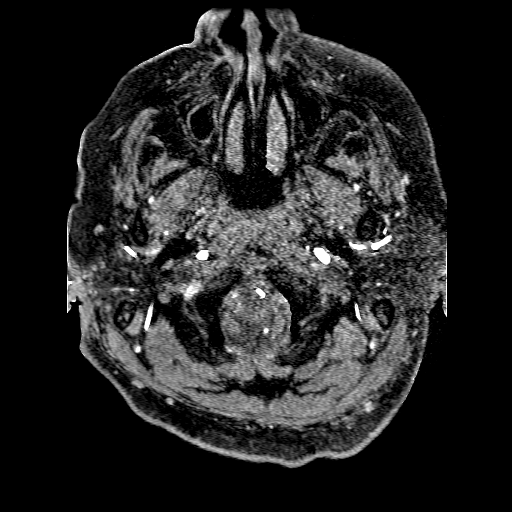
[im 16/184]
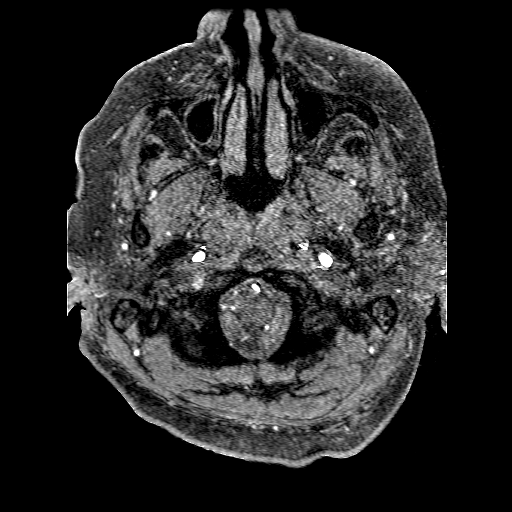
[im 20/184]
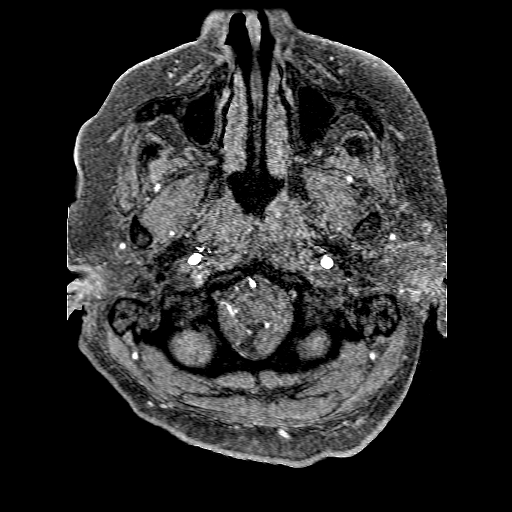
[im 24/184]
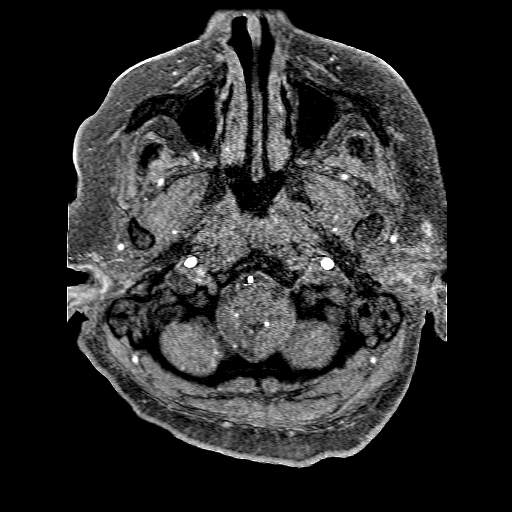
[im 28/184]
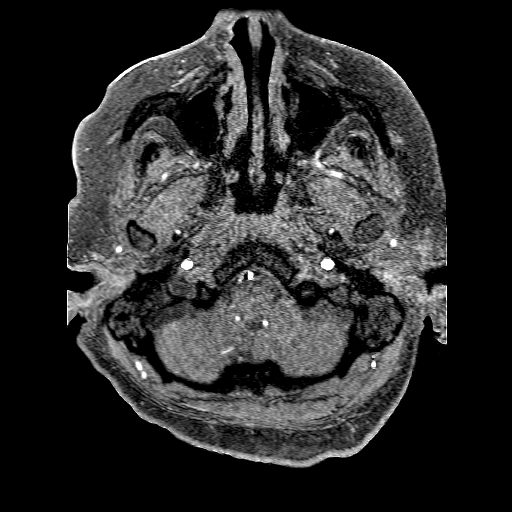
[im 32/184]
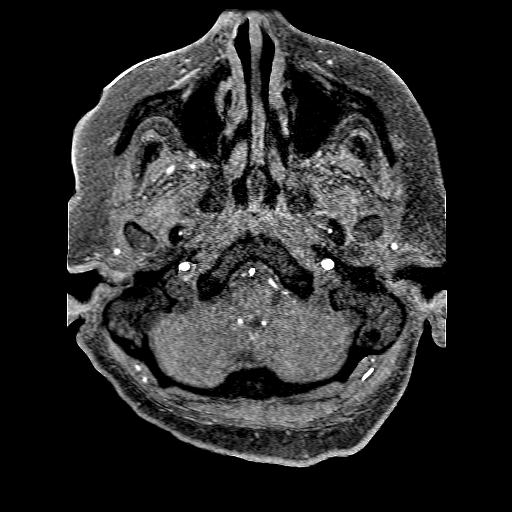
[im 36/184]
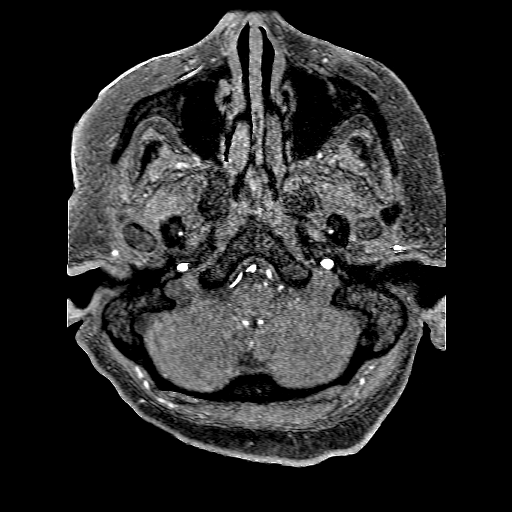
[im 39/184]
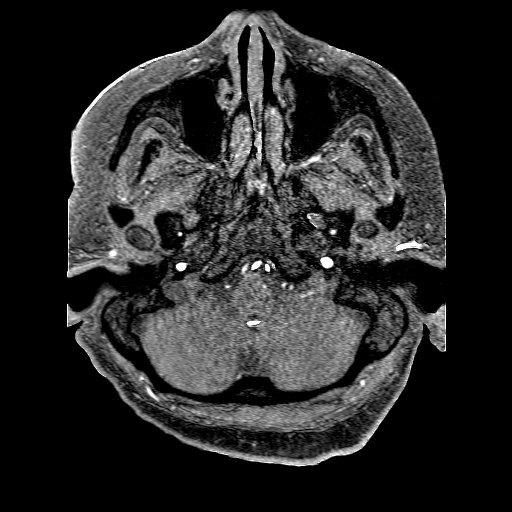
[im 43/184]
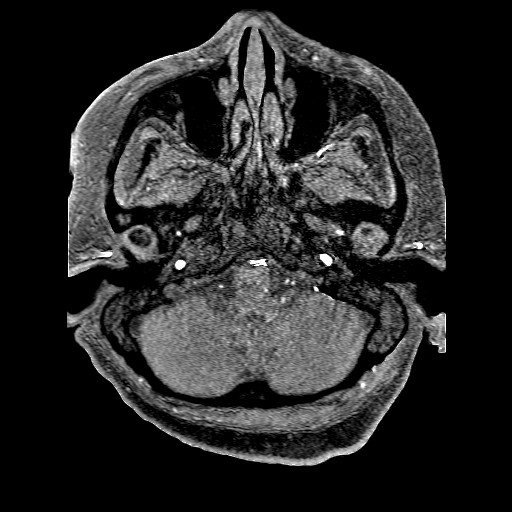
[im 59/184]
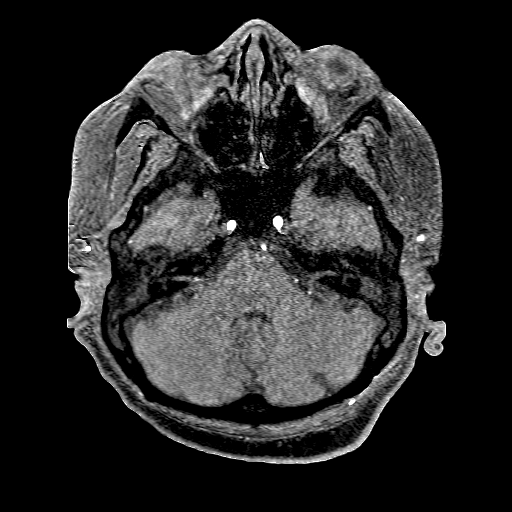
[im 82/184]
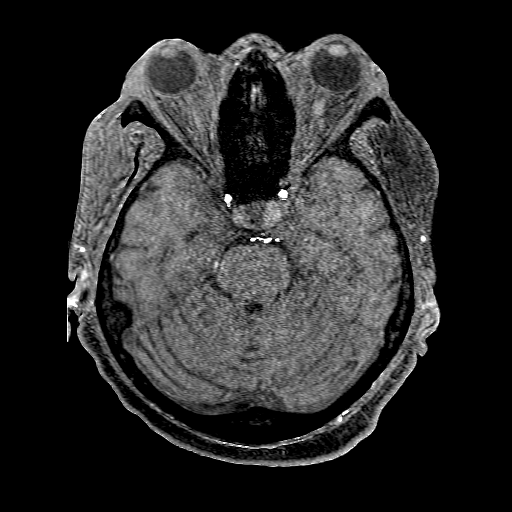
[im 94/184]
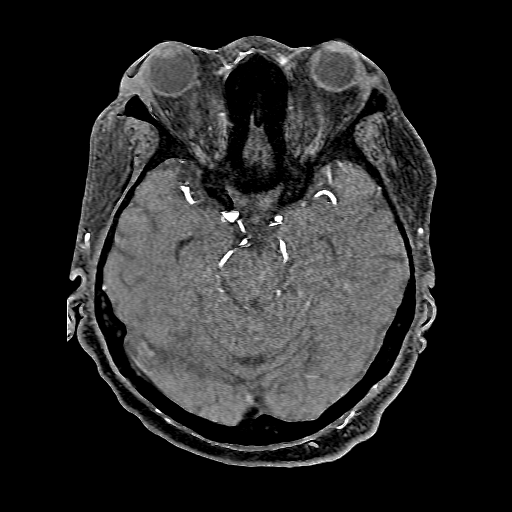
[im 106/184]
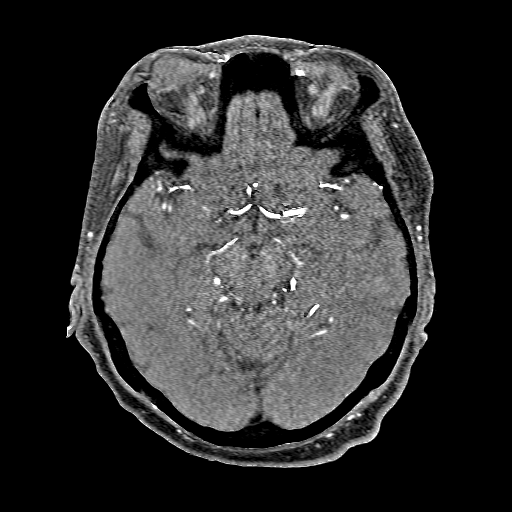
[im 129/184]
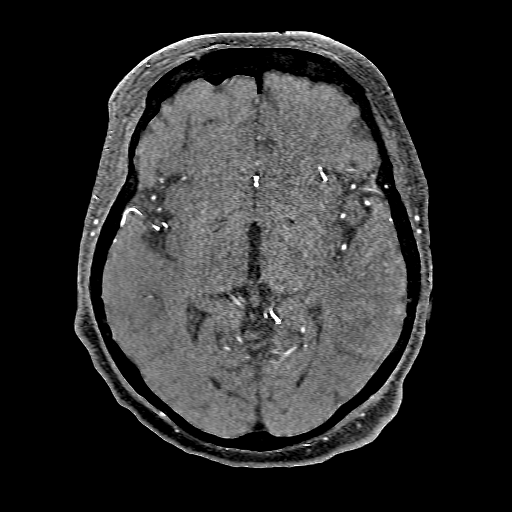
[im 152/184]
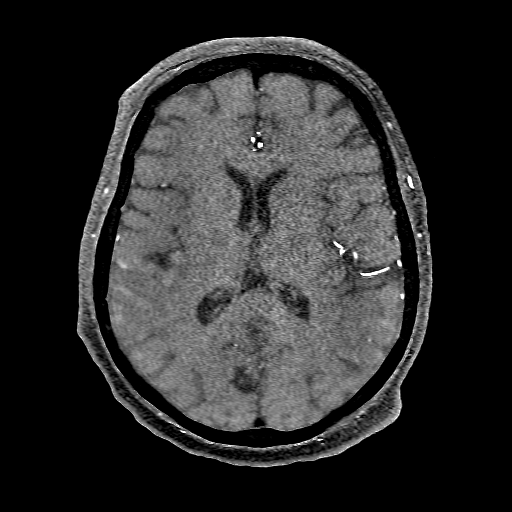
[im 156/184]
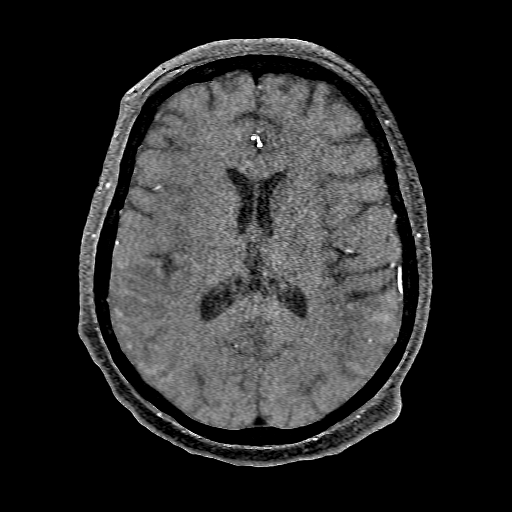
[im 176/184]
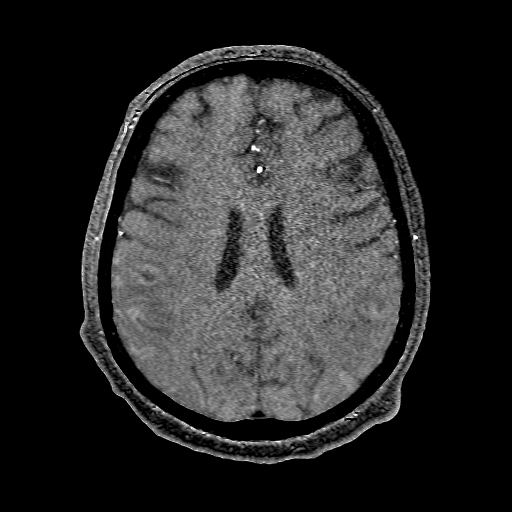

[20 of 48 positions shown; findings below may reference images not displayed]

FINDINGS: Left vertebral artery dominant and widely patent. Left PICA likely
supplied by the left AICA. Hypoplastic right vertebral artery which
is very small. Hypoplastic proximal basilar due to persistent
trigeminal artery on the left. Superior cerebellar and posterior
cerebral arteries patent bilaterally. Posterior communicating artery
patent bilaterally.

Internal carotid artery patent bilaterally without significant
stenosis. Anterior and middle cerebral arteries patent bilaterally
without significant stenosis

Negative for cerebral aneurysm.
IMPRESSION: No significant intracranial stenosis

Persistent trigeminal artery on the right, a congenital variation.

## 2019-11-24 IMAGING — MR MR HEAD W/O CM
7 of 11 series · 25 of 48 positions shown · non-contrast
Comparison: CT head [DATE]

CLINICAL DATA: Stroke.

EXAM:
MRI HEAD WITHOUT CONTRAST
TECHNIQUE: Multiplanar, multiecho pulse sequences of the brain and surrounding
structures were obtained without intravenous contrast.

[Series 2: DWI · axial · 3.0mm · 0.94mm/px · z∈[-83,+59]mm · 8 of 100 slices shown (1 of 2)]
[im 1/100]
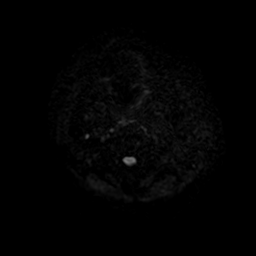
[im 15/100]
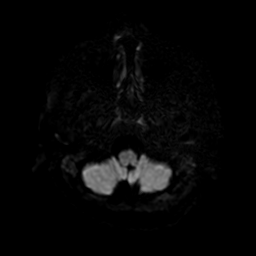
[im 29/100]
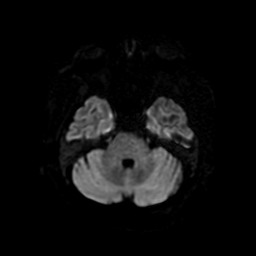
[im 43/100]
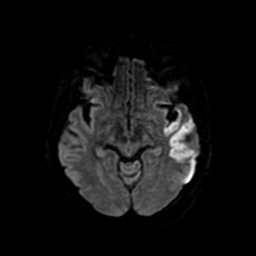
[im 57/100]
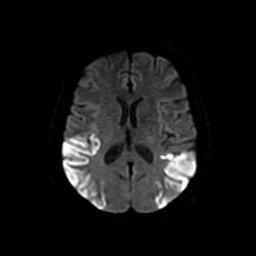
[im 71/100]
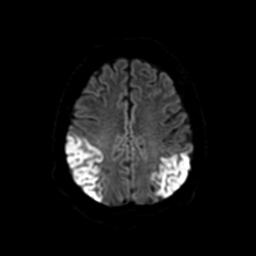
[im 85/100]
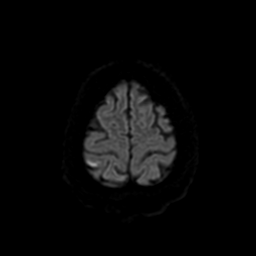
[im 100/100]
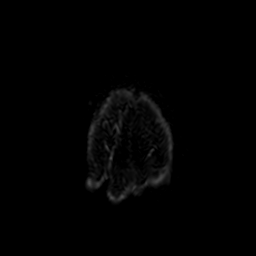

[Series 3: DWI · coronal · 4.0mm · 0.94mm/px · 5 of 70 slices shown (2 of 2)]
[im 1/70]
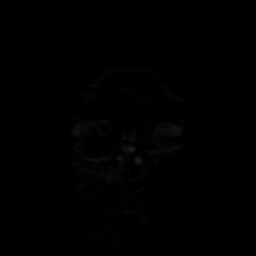
[im 18/70]
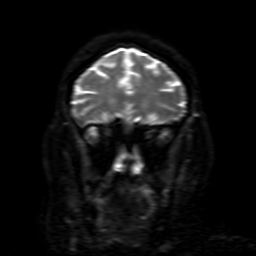
[im 35/70]
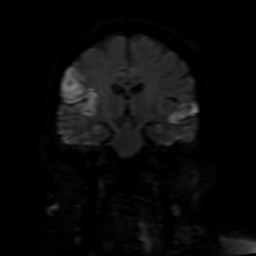
[im 52/70]
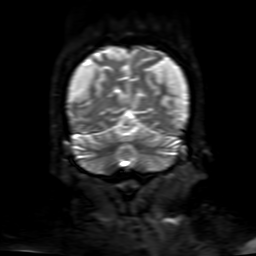
[im 70/70]
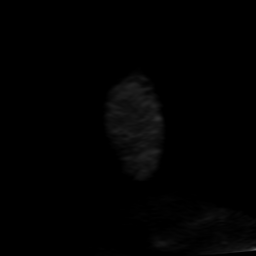

[Series 6: T2 · axial · 5.0mm · 0.23mm/px · 1 of 26 slices shown]
[im 1/26]
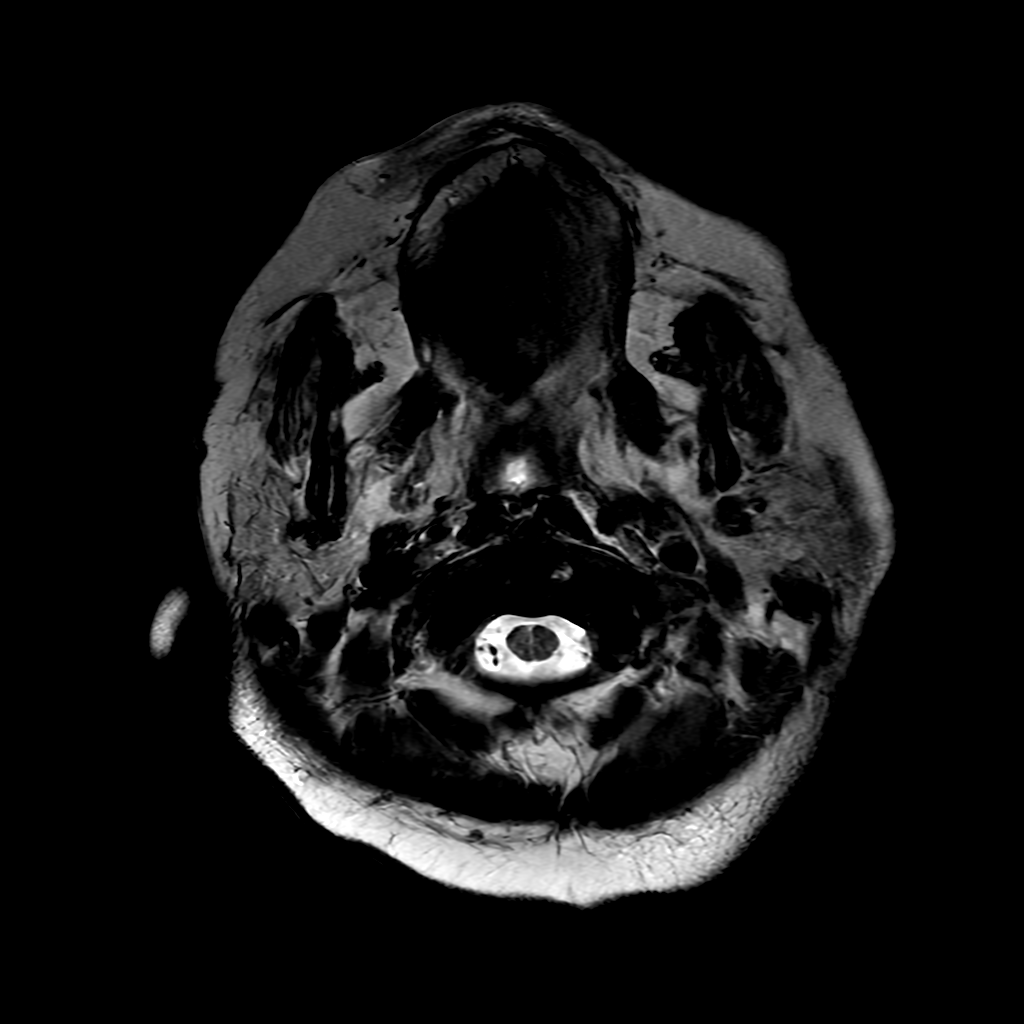

[Series 7: FLAIR · axial · 3.0mm · 0.47mm/px · z∈[-86,+58]mm · 2 of 26 slices shown (1 of 2)]
[im 1/26]
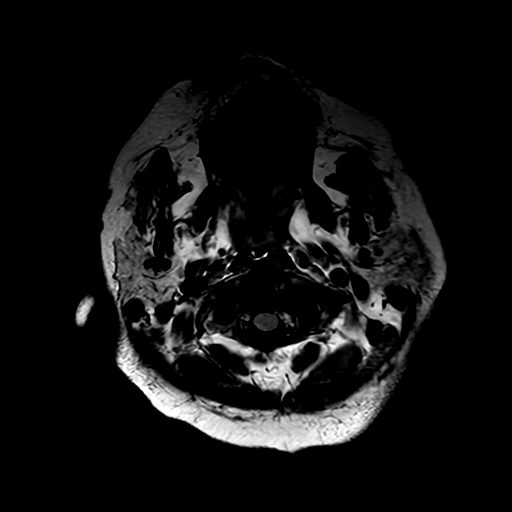
[im 26/26]
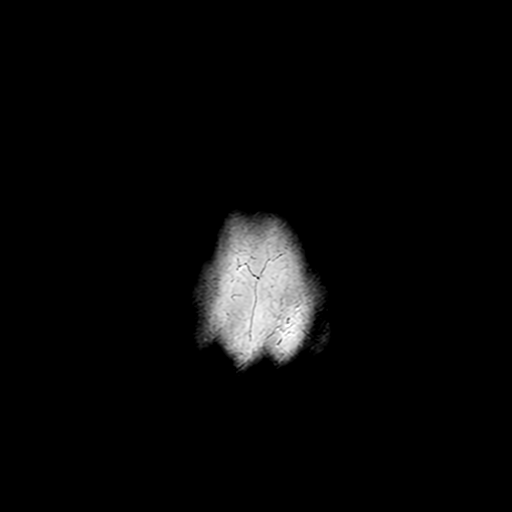

[Series 11: FLAIR · sagittal · 5.0mm · 0.23mm/px · 2 of 23 slices shown (2 of 2)]
[im 1/23]
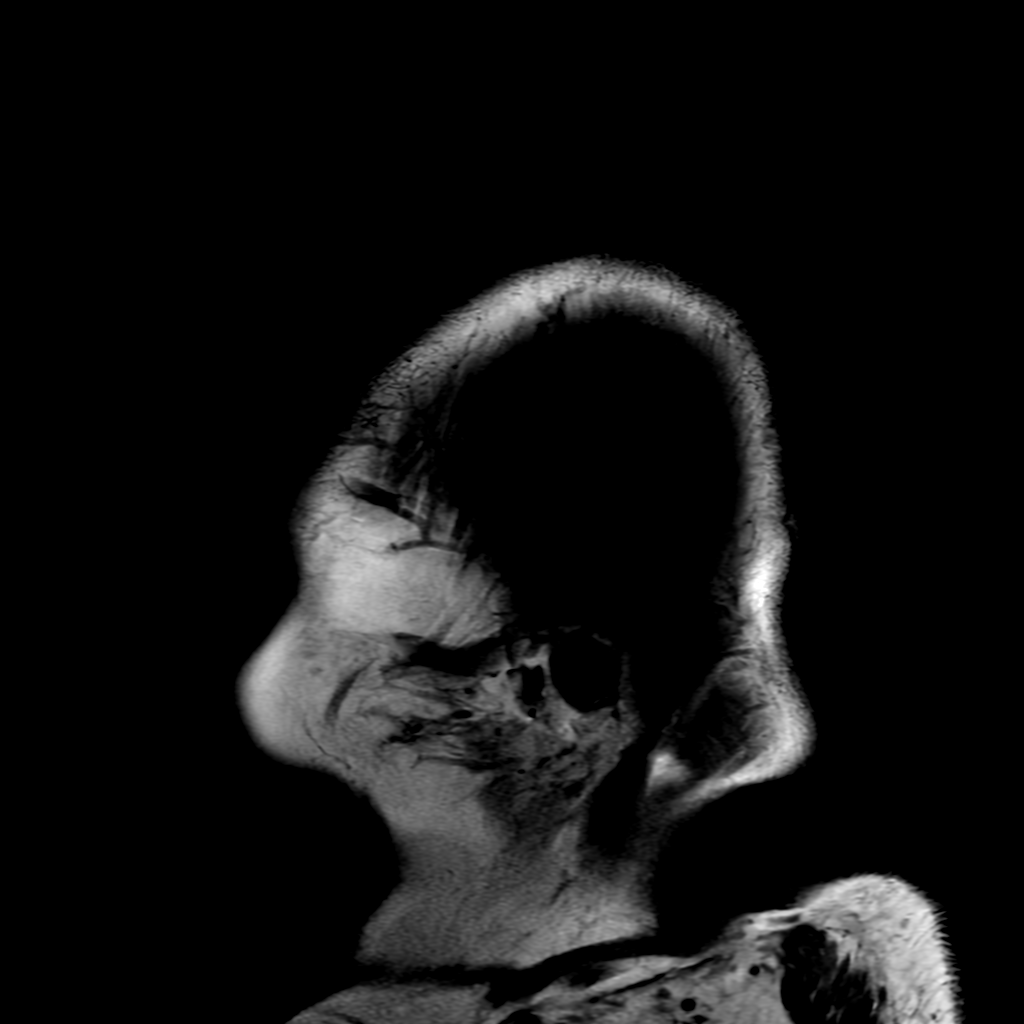
[im 23/23]
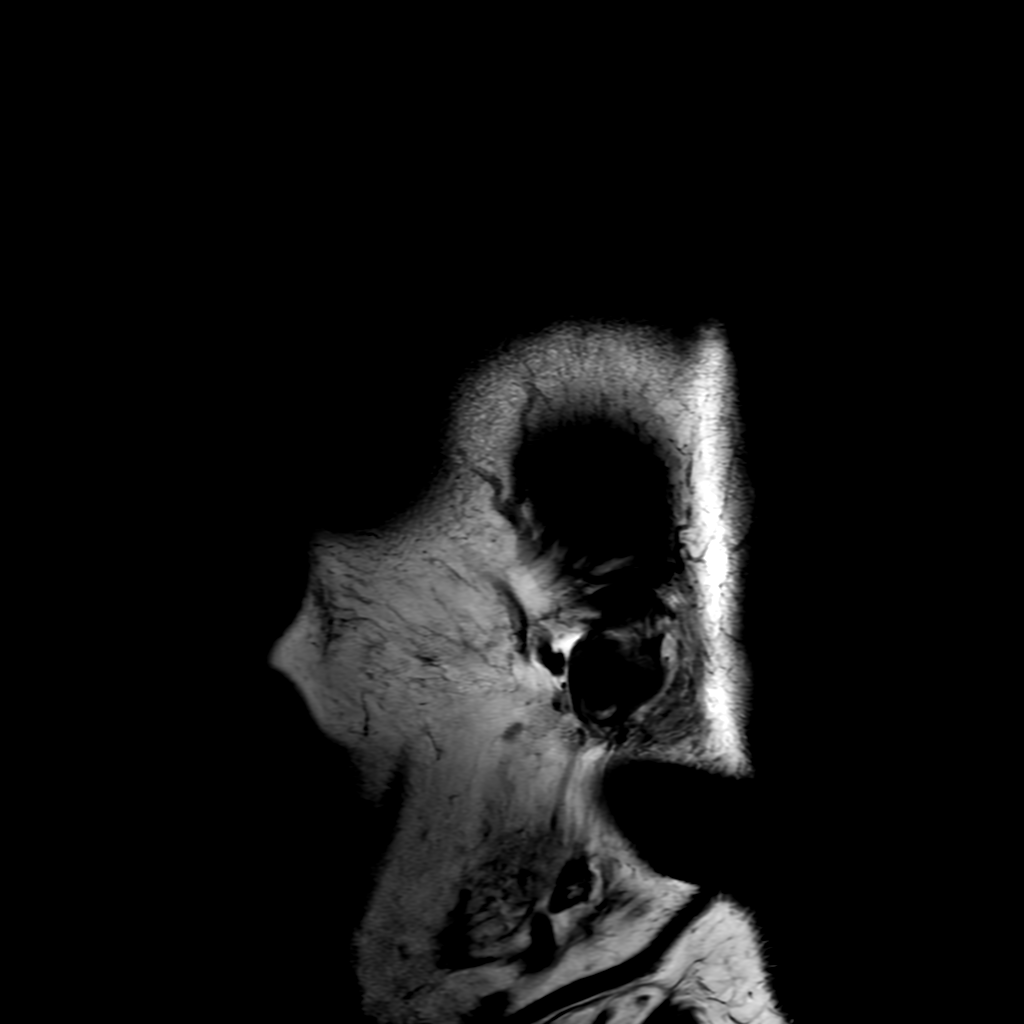

[Series 250: ADC · axial · 3.0mm · 0.94mm/px · z∈[-83,+59]mm · 4 of 50 slices shown (1 of 2)]
[im 1/50]
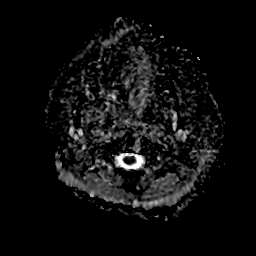
[im 17/50]
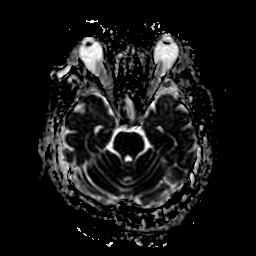
[im 33/50]
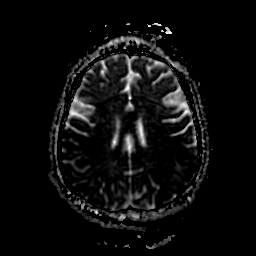
[im 50/50]
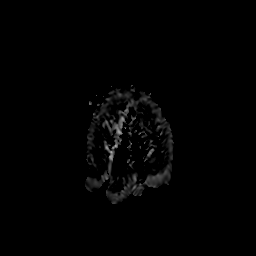

[Series 350: ADC · coronal · 4.0mm · 0.94mm/px · 3 of 35 slices shown (2 of 2)]
[im 1/35]
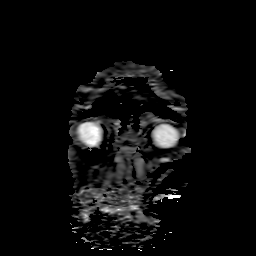
[im 18/35]
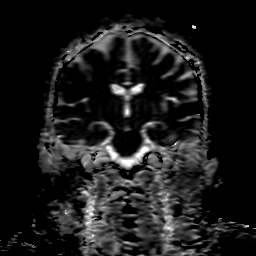
[im 35/35]
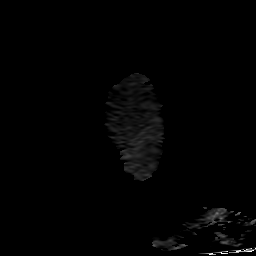

[25 of 48 positions shown; findings below may reference images not displayed]

FINDINGS: Brain: Bilateral acute infarcts in the posterior MCA territory
involving the parietal lobes bilaterally. Left infarct is slightly
larger than the right extending into the superior temporal lobe on
the left. No associated hemorrhage.

No other areas of acute or chronic infarction. No mass or midline
shift. Ventricle size normal.

Vascular: Normal arterial flow voids.

Skull and upper cervical spine: No focal skeletal abnormality.

Sinuses/Orbits: Mild mucosal edema paranasal sinuses. Bilateral
mastoid effusion. Negative orbit.

Other: None
IMPRESSION: Acute nonhemorrhagic infarcts in the posterior MCA territory
bilaterally. Otherwise no evidence of chronic ischemia.

## 2019-11-24 IMAGING — MR MR [PERSON_NAME] HEAD
2 series · 27 of 48 positions shown · non-contrast
Comparison: MRI head [DATE]

CLINICAL DATA: Stroke

EXAM:
MR VENOGRAM OF THE HEAD WITHOUT CONTRAST
TECHNIQUE: Angiographic images of the intracranial venous structures were
obtained using MRV technique without intravenous contrast.

[Series 1: (id) loc ssfse · axial · 5.0mm · 1.17mm/px · z∈[-35,+149]mm · 10 of 33 slices shown]
[im 1/33]
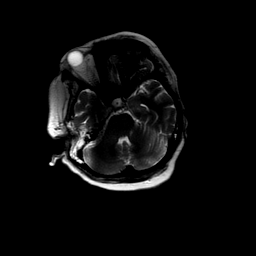
[im 4/33]
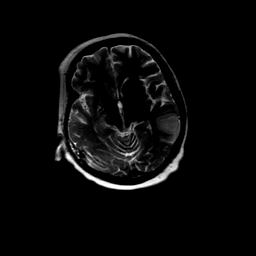
[im 8/33]
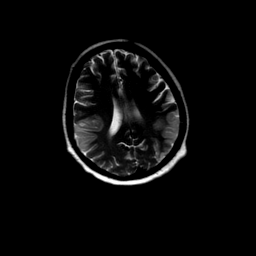
[im 11/33]
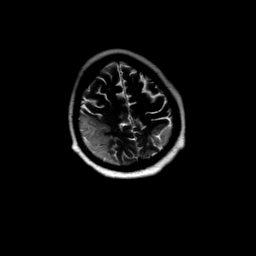
[im 15/33]
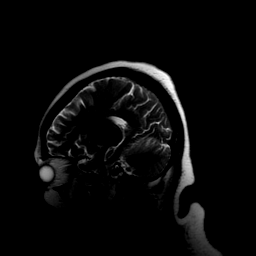
[im 18/33]
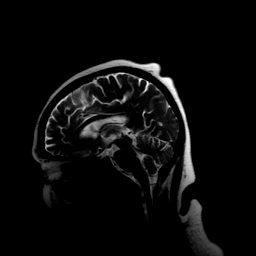
[im 22/33]
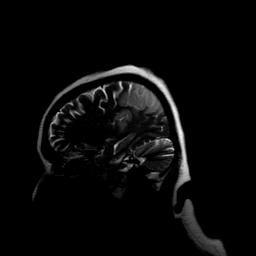
[im 25/33]
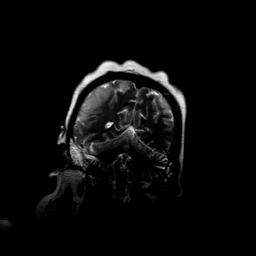
[im 29/33]
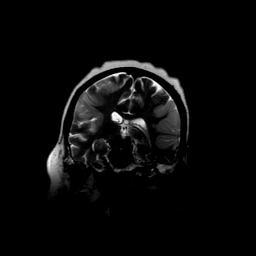
[im 33/33]
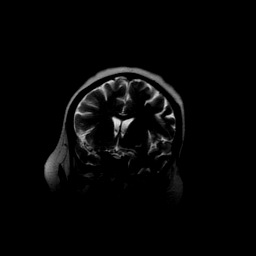

[Series 3: MRV · coronal · 1.5mm · 0.43mm/px · 17 of 120 slices shown]
[im 1/120]
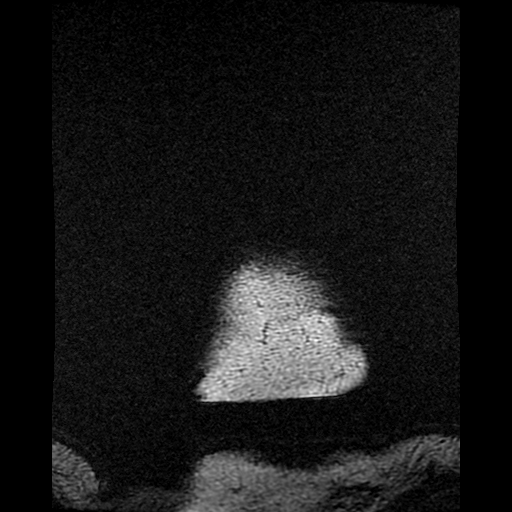
[im 4/120]
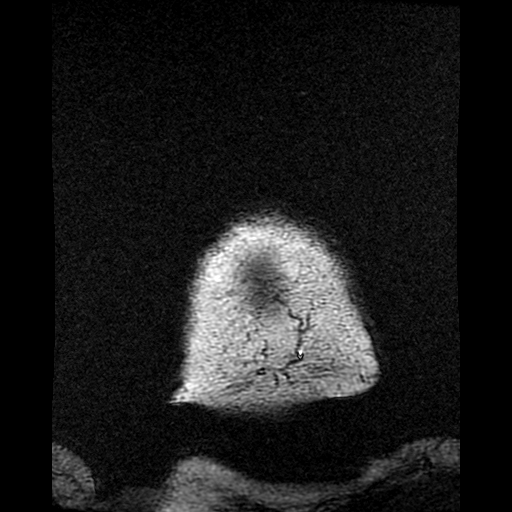
[im 7/120]
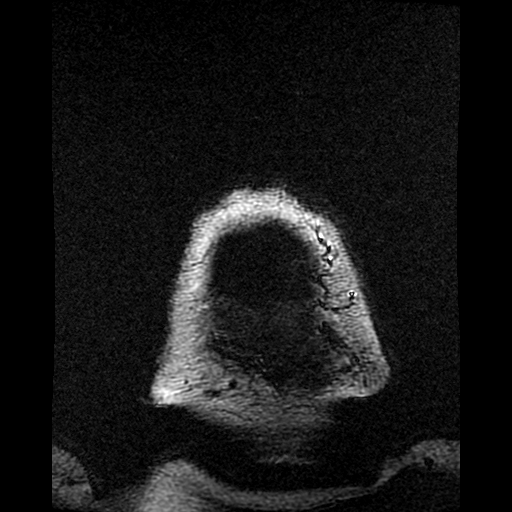
[im 10/120]
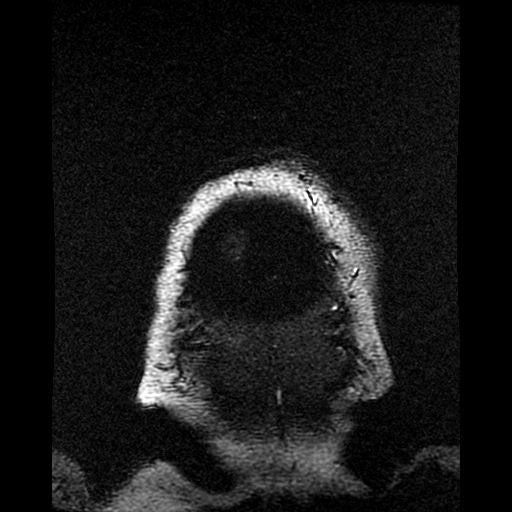
[im 13/120]
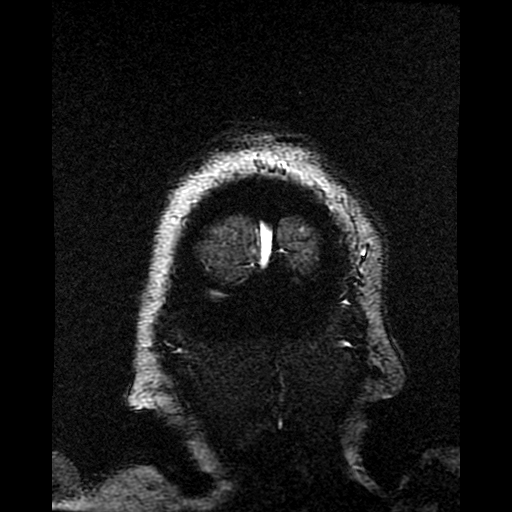
[im 17/120]
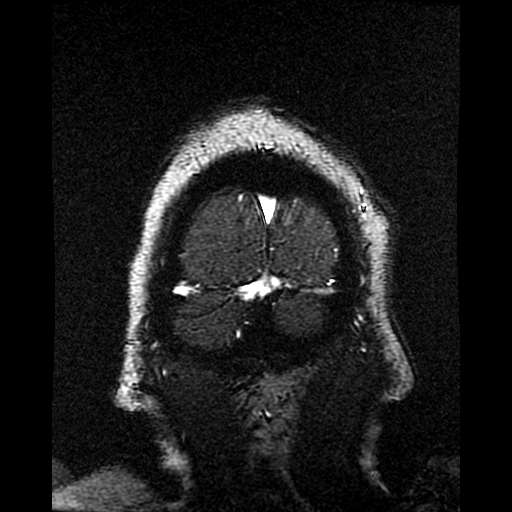
[im 20/120]
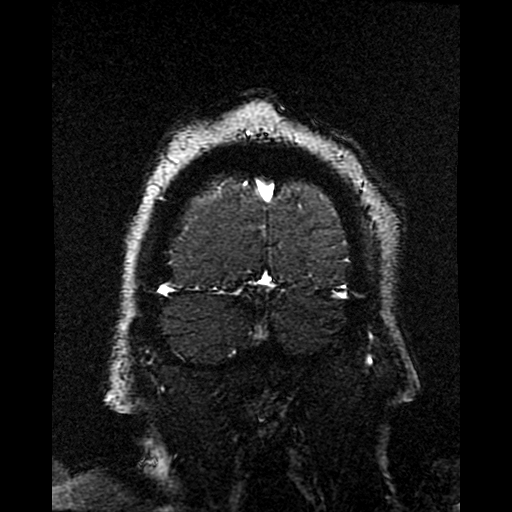
[im 23/120]
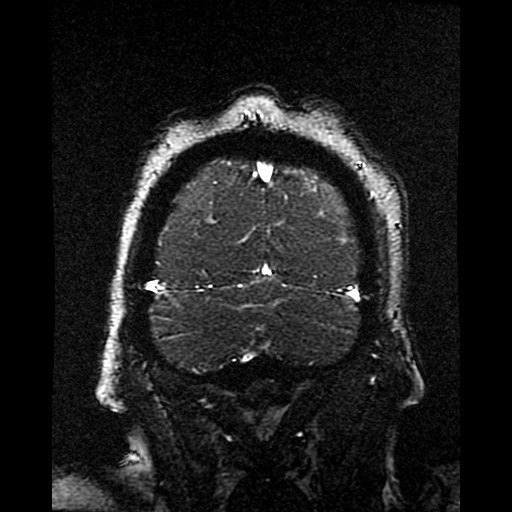
[im 26/120]
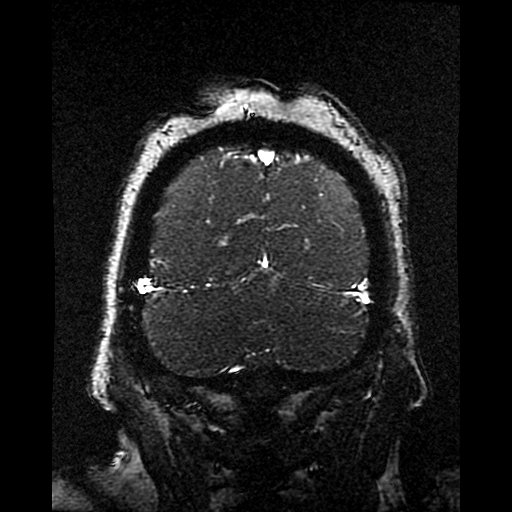
[im 36/120]
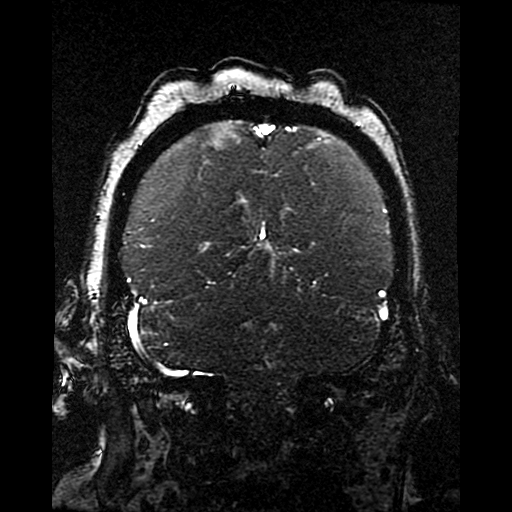
[im 52/120]
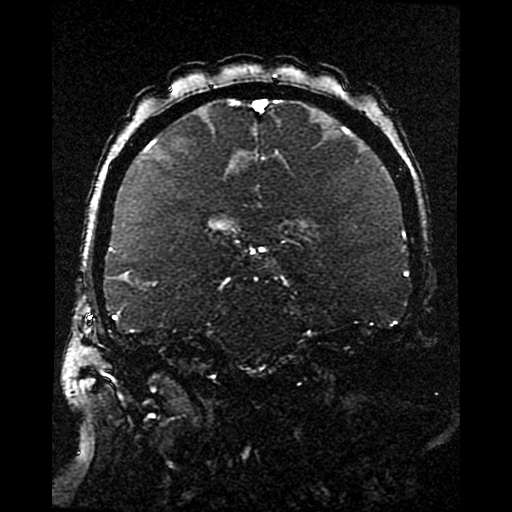
[im 62/120]
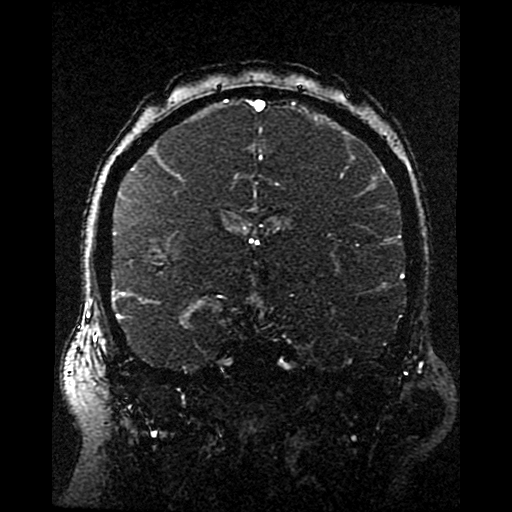
[im 68/120]
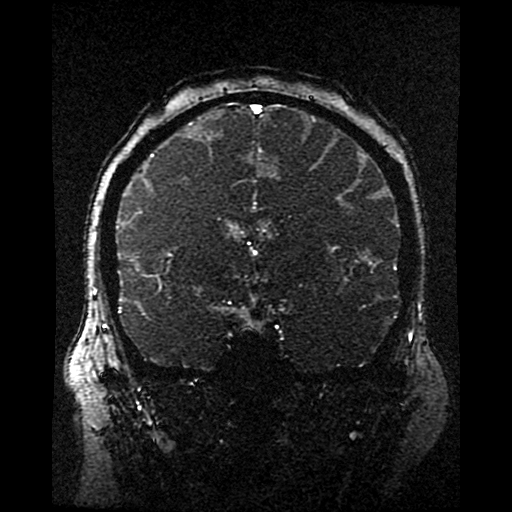
[im 84/120]
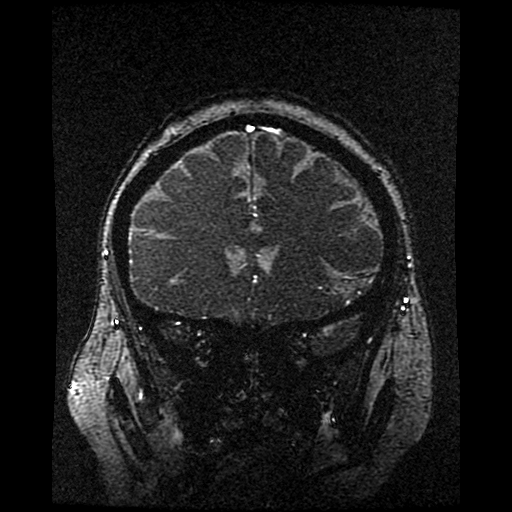
[im 97/120]
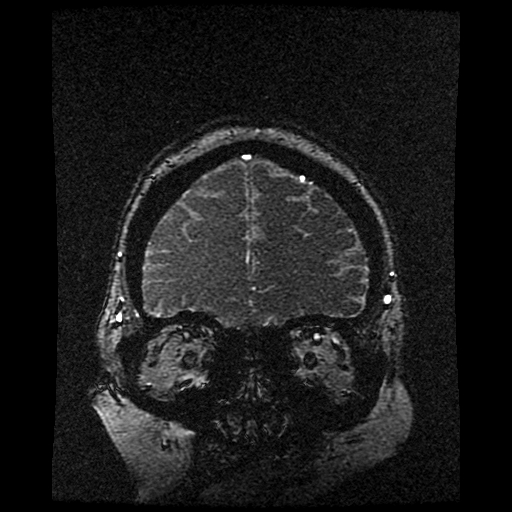
[im 100/120]
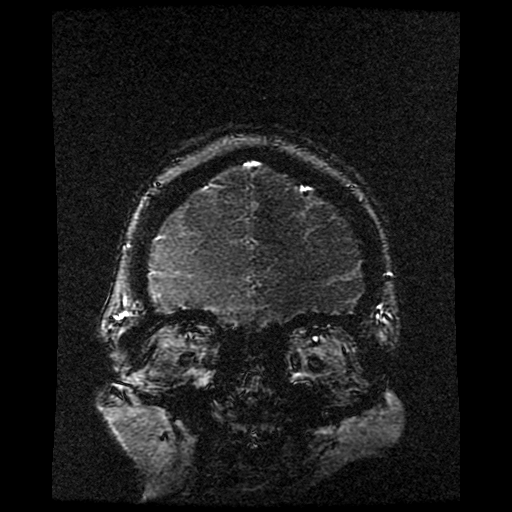
[im 113/120]
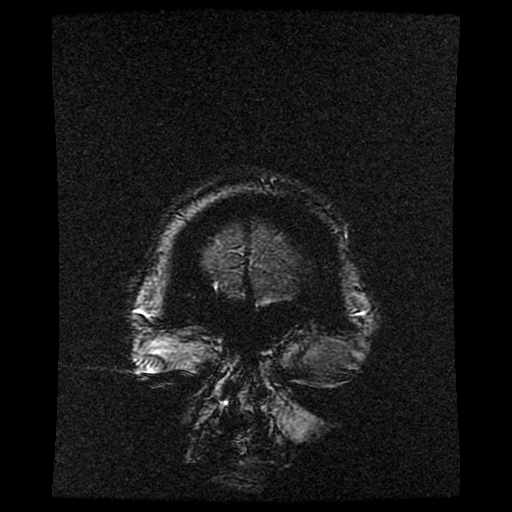

[27 of 48 positions shown; findings below may reference images not displayed]

FINDINGS: Superior sagittal sinus widely patent. Straight sinus widely patent.
Transverse sinus and sigmoid sinus appear patent bilaterally.
Artifact in the sigmoid sinus bilaterally.
IMPRESSION: Negative MRV head.

## 2019-11-24 IMAGING — DX DG ABD PORTABLE 1V
1 series · 1 of 1 positions shown · non-contrast
Comparison: [DATE].

CLINICAL DATA: OG tube placement.

EXAM:
PORTABLE ABDOMEN - 1 VIEW

[abdomen kub]
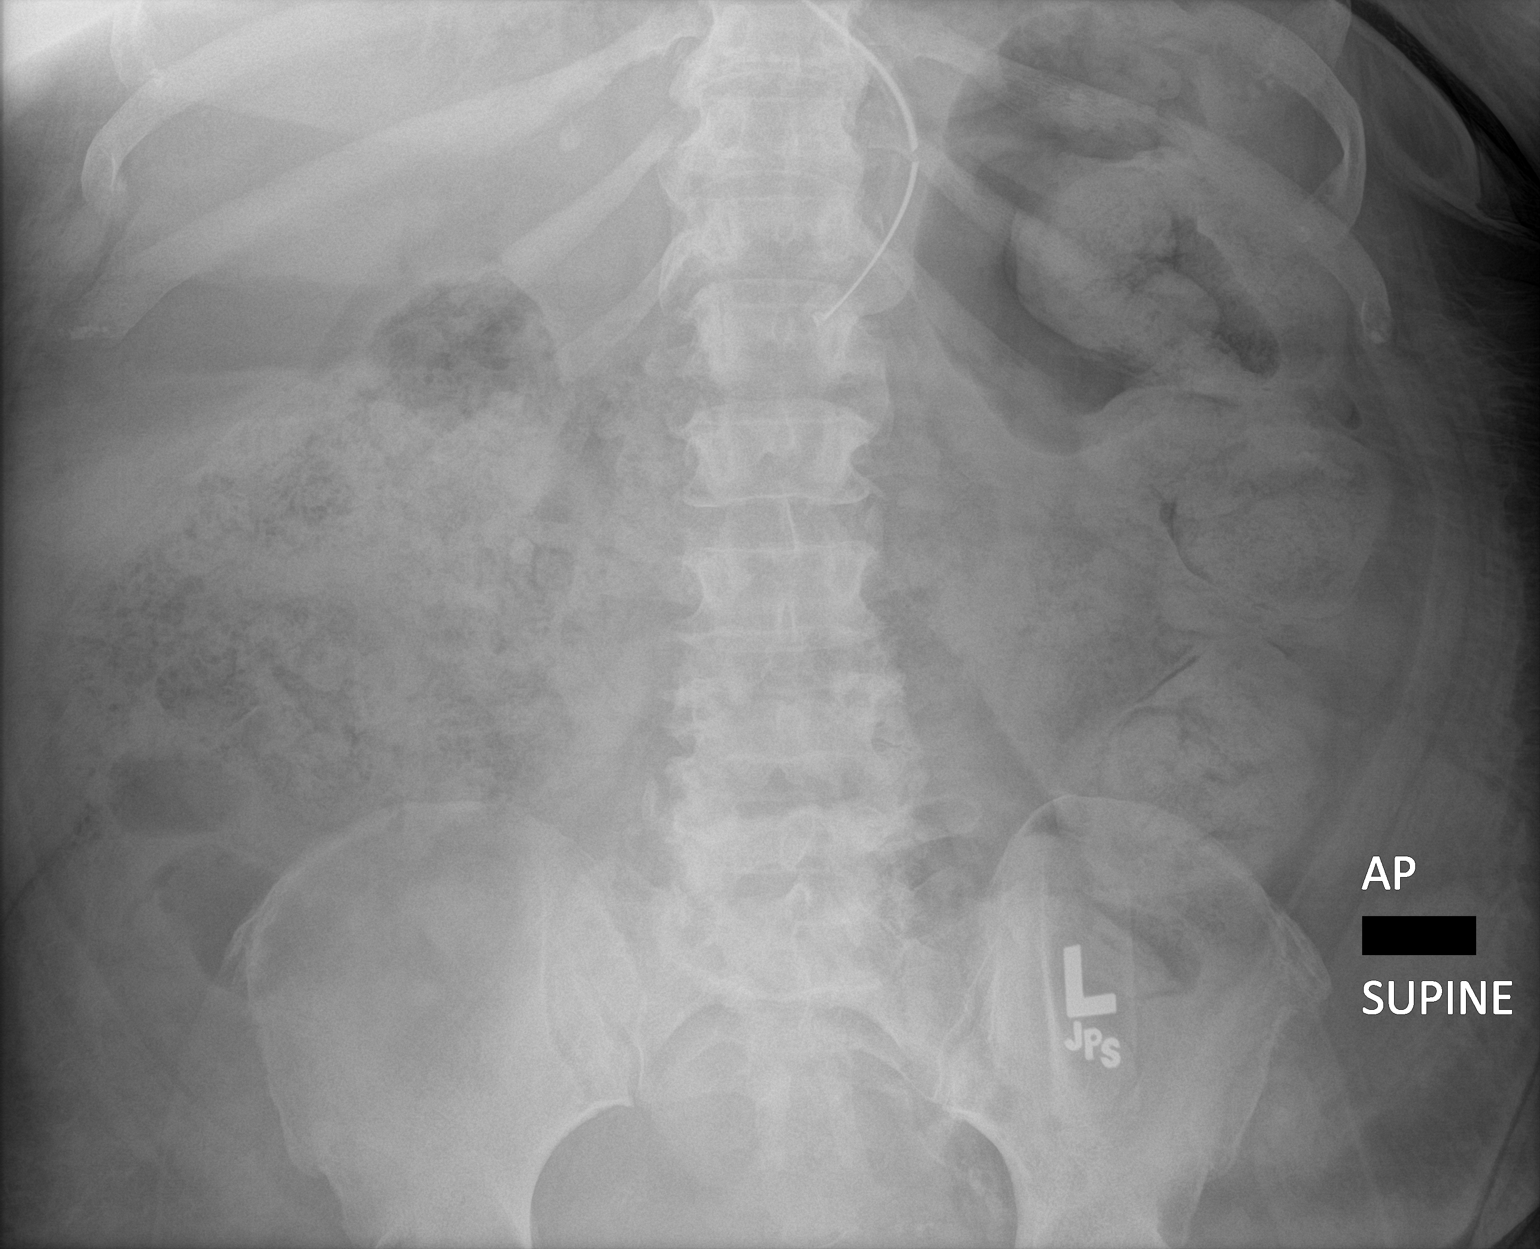

[1 of 1 positions shown; findings below may reference images not displayed]

FINDINGS: OG tube noted with tip over the stomach. Large amount of stool noted
throughout the colon. No bowel distention or free air. Degenerative
change lumbar spine. No acute bony abnormality identified.
IMPRESSION: OG tube noted with tip over the stomach. Large amount of stool noted
throughout the colon. Constipation could present in this fashion. No
bowel distention or free air.

## 2019-11-24 MED ORDER — FUROSEMIDE 10 MG/ML IJ SOLN
40.0000 mg | Freq: Four times a day (QID) | INTRAMUSCULAR | Status: AC
  Administered 2019-11-24 (×2): 40 mg via INTRAVENOUS
  Filled 2019-11-24 (×2): qty 4

## 2019-11-24 MED ORDER — INSULIN GLARGINE 100 UNIT/ML ~~LOC~~ SOLN
15.0000 [IU] | Freq: Two times a day (BID) | SUBCUTANEOUS | Status: DC
  Administered 2019-11-24 (×2): 15 [IU] via SUBCUTANEOUS
  Filled 2019-11-24 (×4): qty 0.15

## 2019-11-24 MED ORDER — MIDAZOLAM HCL 2 MG/2ML IJ SOLN: 2.0000 mg | Freq: Once | INTRAMUSCULAR | Status: DC

## 2019-11-24 MED ORDER — POLYETHYLENE GLYCOL 3350 17 G PO PACK
17.0000 g | PACK | Freq: Every day | ORAL | Status: DC
  Administered 2019-11-24 – 2020-01-09 (×33): 17 g via ORAL
  Filled 2019-11-24 (×41): qty 1

## 2019-11-24 MED ORDER — INSULIN ASPART 100 UNIT/ML ~~LOC~~ SOLN
15.0000 [IU] | SUBCUTANEOUS | Status: DC
  Administered 2019-11-24 – 2019-11-25 (×5): 15 [IU] via SUBCUTANEOUS

## 2019-11-24 MED ORDER — POTASSIUM CHLORIDE 20 MEQ/15ML (10%) PO SOLN
40.0000 meq | Freq: Four times a day (QID) | ORAL | Status: AC
  Administered 2019-11-24 (×2): 40 meq via ORAL
  Filled 2019-11-24 (×2): qty 30

## 2019-11-24 MED ORDER — ASPIRIN 81 MG PO CHEW: 81.0000 mg | CHEWABLE_TABLET | Freq: Every day | ORAL | Status: DC

## 2019-11-24 MED ORDER — ASPIRIN 81 MG PO CHEW
81.0000 mg | CHEWABLE_TABLET | Freq: Every day | ORAL | Status: DC
  Administered 2019-11-24 – 2020-01-06 (×44): 81 mg
  Filled 2019-11-24 (×44): qty 1

## 2019-11-24 NOTE — Consult Note (Signed)
WOC Nurse Consult Note: Patient receiving care in [facility] [id number].  Assisted with turning by primary RN. Reason for Consult: left buttock wound Wound type: skin tear/abrasion from MASD-incontinence associated, friction and shear Pressure Injury POA: Yes/No/NA Measurement: roughly 3 cm x 2 cm with some loss of overlying epidermis Wound bed: darkened, raised, non-smooth Drainage (amount, consistency, odor) none Periwound: intact Dressing procedure/placement/frequency: foam dressing, change every 3 days and prn. Monitor the wound area(s) for worsening of condition such as: Signs/symptoms of infection,  Increase in size,  Development of or worsening of odor, Development of pain, or increased pain at the affected locations.  Notify the medical team if any of these develop.  Thank you for the consult.  Discussed plan of care with the bedside nurse.  [place] nurse will not follow at this time.  Please re-consult the [place] team if needed.  Val Riles, RN, MSN, CWOCN, CNS-BC, pager [phone]

## 2019-11-24 NOTE — Progress Notes (Signed)
Nutrition Follow-up  DOCUMENTATION CODES:   Morbid obesity  INTERVENTION:   Continue TF via OGT:  Vital High Protein at 60 ml/h (1440 ml per day).  Provides 1440 kcal, 126 gm protein, 1204 ml free water daily.  NUTRITION DIAGNOSIS:   Inadequate oral intake related to inability to eat as evidenced by NPO status.  Ongoing   GOAL:   Provide needs based on ASPEN/SCCM guidelines  Met with TF  MONITOR:   Vent status, TF tolerance, I & O's, Labs  REASON FOR ASSESSMENT:   Ventilator, Consult Enteral/tube feeding initiation and management  ASSESSMENT:   59 yo female admitted S/P fall at home, unresponsive, hypotensive. Intubated by EMS. Suspected overdose with toxic/metabolic encephalopathy and aspiration PNA. PMH includes schizophrenia, HIV, DM, neuropathy, HTN.  Discussed patient in ICU rounds and with RN today. CT head positive for stroke. MRI revealed bilateral acute infarcts in the posterior MCA territory.  OG tube had to be replaced last night, TF resumed today.   [place] RN consulted today for L buttock skin tear/abrasion from MASD.  Patient remains intubated on ventilator support MV: 9 L/min Temp (24hrs), Avg:98.8 F (37.1 C), Min:97.8 F (36.6 C), Max:100.1 F (37.8 C)   Receiving TF via OG tube: Vital High Protein at 60 ml/h.  Tolerating TF well to meet nutrition needs.  Labs reviewed. Na 134 (L) CBG's: 177-153  Medications reviewed and include novolog, lantus, miralax, senokot.  Weight has fluctuated since admission. Currently 105.9 kg I/O +15 L  Diet Order:   Diet Order            Diet NPO time specified  Diet effective now              EDUCATION NEEDS:   Not appropriate for education at this time  Skin:  Skin Assessment: (L buttock skin tear/abrasion from MASD)  Last BM:  3/22  Height:   Ht Readings from Last 1 Encounters:  11/17/19 _0  (1.6 m)    Weight:   Wt Readings from Last 1 Encounters:  11/24/19 105.9 kg    Ideal  Body Weight:  52.3 kg  BMI:  Body mass index is 41.36 kg/m.  Estimated Nutritional Needs:   Kcal:  1250-1600  Protein:  105-130 gm  Fluid:  >/= 1.6 L    Molli Barrows, RD, LDN, CNSC Please refer to Amion for contact information.

## 2019-11-24 NOTE — Progress Notes (Addendum)
[place] Progress Note Patient Name: MELANEE KVALE DOB: [birth year] MRN: [id number]   Date of Service  11/24/2019  HPI/Events of Note  Pt has bilateral MCA territory acute CVA's  eICU Interventions  Stat neurology consult requested. I spoke with Dr. Lorraine Lax.        [name] [place] 11/24/2019, 5:39 AM

## 2019-11-24 NOTE — Progress Notes (Signed)
[place] Progress Note Patient Name: Susan [name] DOB: [birth year] MRN: [id number]   Date of Service  11/24/2019  HPI/Events of Note  Pt needs to be sedated to obtain a head CT scan.  eICU Interventions  Versed 2 mg iv x 1        Cedarius Kersh U Argelio Granier 11/24/2019, 5:18 AM

## 2019-11-24 NOTE — Progress Notes (Signed)
Pt had a very small bloody BM. BM looked like a blood clot. Elink made aware. Will continue to assess.

## 2019-11-24 NOTE — Progress Notes (Addendum)
NAME:  Susan [name], MRN:  [id number], DOB:  1960-12-12, LOS: 60 ADMISSION DATE:  11/11/2019, CONSULTATION DATE:  3/10 REFERRING MD:  Sabra Heck, CHIEF COMPLAINT:  Acute encephalopathy and respiratory failure    Brief History   59 year old female smoker admitted 3/10 with overdose complicated by aspiration PNA with MRSA,  Pneumococcal bacteremia, toxic metabolic encephalopathy and septic shock.    Past Medical History  Frequent falls at home,Tobacco abuse, diabetes with painful polyneuropathy, bipolar disease, GERD, HIV, schizophrenia, history of IV drug abuse, seizure disorder.   Significant Hospital Events   3/10 Admit with overdose, intubated on arrival, in shock   3/15 Ventilator asynchrony, heavy sedation & intermittent paralysis   3/16 Lasix drip.  Awaiting TEE.  Sputum w/ MRSA.   Coming back down on sedation, changed to [facility]. TEE showing NO EVIDENCE of endocarditis. ABX changed to pen G per ID. MRSA therapy complete. EF improved. Now up to 50% from 20-25% 3/17 Heavily sedated. Having episodes of bradycardia. Still massively overloaded (>14L), increasing lasix gtt to 8 mg from 4, adding metolazone.  3/19 Lasix to 40 every 12, changing RASS goal to -2, decrease in fentanyl, decreasing PEEP, anticipating trialing pressure support ventilation.  Watching upward trend of white blood cell count and low-grade fever 3/20 BM early am. Agitation early am > sedate post am PT psy meds 3/22 Decreased mentation and movement of extremities overnight, sent for Head CT which revealed acute vs subacute bilateral MCA infarcts   Consults:  ID Cards for TEE Neurology   Procedures:  ETT 3/10 >>  R Fem TLC (ER) 3/10  R IJ TLC 3/10  L IJ TLC 3/10 >> 3/17  RUE PICC 3/16 >>   Significant Diagnostic Tests:   CT brain 3/10 >> Normal head CT  EEG 3/10 >> Moderate to severe diffuse encephalopathy. No seizures  Urine drug screen 3/10 >> positive for THC, acetaminophen Q000111Q, salicylate <7, ETOH Q000111Q  ECHO  3/11 >> EF 20-25%, LV function severely diminished, Global Hypokinesis,LV hypertrophy, Grade 1 Diastolic Dysfunction, Elevated PASP, Dilated LA, MV regurgitation, small mobile density right coronary cusp  TEE 3/16 >> LVEF 55%, No LA/LAA thrombus or masses, Previously noted mass on the aortic valve is a node of Arancius.  Normal variant and no evidence of endocarditis.Trivial TR and trivial PR  Micro Data:  Influenza A/B 3/10 >> negative  COVID 3/10 >> negative  MRSA PCR 3/10 >> positive  BCx2 3/10 >> streptococcus pneumoniae >> S-rocephin BCx1 (aerobic only) 3/10 >> negative  Tracheal aspirate 3/11 >> MRSA BCx2 3/12 >> negative   Antimicrobials:  Vancomycin 3/10 >> 3/16 Unasyn 3/10 >> 3/16 Pen G 3/16 >>   Interim history/subjective:  Lying in bed seen spontaneously moving lower and right upper extremity, unable to follow any commands.   Objective   Blood pressure (!) 91/38, pulse 71, temperature 98.5 F (36.9 C), temperature source Axillary, resp. rate (!) 26, height 5\' 3"  (1.6 m), weight 105.9 kg, SpO2 100 %.    Vent Mode: PRVC FiO2 (%):  [40 %-100 %] 40 % Set Rate:  [22 bmp] 22 bmp Vt Set:  [420 mL] 420 mL PEEP:  [8 cmH20] 8 cmH20 Plateau Pressure:  [24 cmH20-27 cmH20] 24 cmH20   Intake/Output Summary (Last 24 hours) at 11/24/2019 0756 Last data filed at 11/24/2019 0700 Gross per 24 hour  Intake 3782.68 ml  Output 1425 ml  Net 2357.68 ml   Filed Weights   11/22/19 0500 11/23/19 0500 11/24/19 0500  Weight: 102.7  kg 105.6 kg 105.9 kg    Examination: General: Chronically ill appearing elderly obese female on mechanical ventilation, in NAD HEENT: ETT, MM pink/moist, PERRL,  Neuro: Will open eyes to verbal stimuli, unable to follow any commands, seen spontaneously moving bilateral lower extremities and right upper CV: s1s2 regular rate and rhythm, no murmur, rubs, or gallops,  PULM:  Minimal bilateral rhonchi, no increased work of breathing, no accessory muscle use seen    GI: soft, bowel sounds active in all 4 quadrants, non-tender, non-distended, tolerating TF Extremities: warm/dry, generalized edema  Skin: no rashes or lesions  Resolved Hospital Problem list   Rhabdomyolysis  Elevated INR Septic shock   Assessment & Plan:   Acute hypoxic/hypercapnic respiratory failure with ARDS from MRSA PNA and aspiration pneumonia. Hx of asthma. Plan: Continue ventilator support with lung protective strategies  Wean PEEP and FiO2 for sats greater than 90%. Head of bed elevated 30 degrees. Plateau pressures less than 30 cm H20.  Follow intermittent chest x-ray and ABG.   SAT/SBT as tolerated, mentation preclude extubation  Ensure adequate pulmonary hygiene  Follow cultures  VAP bundle in place  PAD protocol Continue to diurese  Given prolonged intubation and now new bilateral CVA will need to address possible need to trach with family  Acute bilateral cerebral infarcts  -Patient was seen with neuo status change including inability to follow commands that was seen to worsen overnight. Stat head CT scan was obtain that revealed bilateral MCA infarctions that are large in size and well established  -TEE 3/16 with no sign of endocarditis  Plan: Neurology following, appreciate assistance  MRI brain pending Allow for permissive hypertension  Hold ASA  MRSA and aspiration pneumonia, and Pneumococcal bacteremia -Completed ABx for MRSA HCAP 3/16 -ID s/o as of 3/20 Plan: Continue Penicillin, last day 3/23  Hx of HIV Plan: Continue home Descovy and Tivicay   Acute systolic heart failure with sepsis induced CM. Hx of HTN, HLD. Plan: Home medications including beta blocker, clonidine, norvasc, vasotec, and maxide on hold Continue crestor   Acute metabolic encephalopathy 2nd to sepsis, hypoxia. Hx of schizophrenia, seizures. Plan: RASS goal 0 to -1 Continue Cogentin, Buspar, Doxepin, Neurotin, and Zoloft  Wean sedation as able  Delirium precaution    Acute Kidney Injury 2nd to ATN in setting of septic shock and hypoxia. Hypokalemia. - baseline creatinine 0.78 from 06/29/19 Plan: Creatinine slowly improving  Trend Bmet  Avoid nephrotoxins  Monitor urine output and function closely Gentle diuresing   Constipation. Plan: Continue bowel regiment   DM type II poorly controlled with hyperglycemia. - HbA1C 12.3 from 11/11/19 Plan: Resistant SSI  Increased long acting insulin  Tube feed coverage   GOC  Long discussion held with patients daughter Devone Duggar over the phone 3/23 regarding new strokes seen over overnight with continued prolonged ventilator dependence. Possibility for need to tracheostomy in the near future was discussed. She stated that she would like to speak to her other siblings and make a decision moving forward.   Best practice:  Diet: tube feeds.   DVT prophylaxis: SQ heparin GI prophylaxis: protonix Mobility: BR Code Status: full code  Family Communication: Daughter Aquinna Wetsel over the phone 11/24/2019 Disposition: to ICU   Labs:   CMP Latest Ref Rng & Units 11/24/2019 11/23/2019 11/22/2019  Glucose 70 - 99 mg/dL 292(H) 183(H) -  BUN 6 - 20 mg/dL 44(H) 43(H) -  Creatinine 0.44 - 1.00 mg/dL 2.13(H) 2.48(H) -  Sodium 135 - 145 mmol/L  134(L) 137 137  Potassium 3.5 - 5.1 mmol/L 3.8 2.9(L) 3.1(L)  Chloride 98 - 111 mmol/L 89(L) 86(L) -  CO2 22 - 32 mmol/L 35(H) 36(H) -  Calcium 8.9 - 10.3 mg/dL 7.7(L) 8.0(L) -  Total Protein 6.5 - 8.1 g/dL - - -  Total Bilirubin 0.3 - 1.2 mg/dL - - -  Alkaline Phos 38 - 126 U/L - - -  AST 15 - 41 U/L - - -  ALT 0 - 44 U/L - - -    CBC Latest Ref Rng & Units 11/24/2019 11/23/2019 11/22/2019  WBC 4.0 - 10.5 K/uL 14.8(H) 12.6(H) -  Hemoglobin 12.0 - 15.0 g/dL 9.7(L) 9.6(L) 10.2(L)  Hematocrit 36.0 - 46.0 % 30.5(L) 30.6(L) 30.0(L)  Platelets 150 - 400 K/uL 300 260 -    ABG    Component Value Date/Time   PHART 7.390 11/22/2019 2027   PCO2ART 70.6 (HH) 11/22/2019  2027   PO2ART 50.0 (L) 11/22/2019 2027   HCO3 42.6 (H) 11/22/2019 2027   TCO2 45 (H) 11/22/2019 2027   ACIDBASEDEF 4.0 (H) 11/12/2019 1504   O2SAT 81.0 11/22/2019 2027    CBG (last 3)  Recent Labs    11/23/19 1947 11/23/19 2336 11/24/19 0333  GLUCAP 131* 142* 177*    CRITICAL CARE Performed by: Johnsie Cancel  Total critical care time: 39 minutes  Critical care time was exclusive of separately billable procedures and treating other patients.  Critical care was necessary to treat or prevent imminent or life-threatening deterioration.  Critical care was time spent personally by me on the following activities: development of treatment plan with patient and/or surrogate as well as nursing, discussions with consultants, evaluation of patient's response to treatment, examination of patient, obtaining history from patient or surrogate, ordering and performing treatments and interventions, ordering and review of laboratory studies, ordering and review of radiographic studies, pulse oximetry and re-evaluation of patient's condition.  Johnsie Cancel, NP-C [facility] Contact / Pager information can be found on Amion  11/24/2019, 7:55 AM

## 2019-11-24 NOTE — Progress Notes (Signed)
STROKE TEAM PROGRESS NOTE   INTERVAL HISTORY Pt back from MRI. RN and vascular tech are at the bedside. Pt still intubated, eyes barely open with voice but open with pain. Not following commands. Moving BLE with pain but left UE flaccid. MRI showed b/l MCA stroke but unremarkable MRA and MRV.   Vitals:   11/24/19 1100 11/24/19 1200 11/24/19 1300 11/24/19 1400  BP: (!) 102/53 123/63 110/63 (!) 115/54  Pulse: 62 78 76 81  Resp: (!) 21 (!) 25 (!) 21 19  Temp:      TempSrc:      SpO2: 100% 100% 100% 100%  Weight:      Height:        CBC:  Recent Labs  Lab 11/23/19 0500 11/24/19 0403  WBC 12.6* 14.8*  NEUTROABS  --  10.2*  HGB 9.6* 9.7*  HCT 30.6* 30.5*  MCV 86.0 85.7  PLT 260 [id number]    Basic Metabolic Panel:  Recent Labs  Lab 11/19/19 0500 11/19/19 1655 11/23/19 0500 11/24/19 0403  NA 143   < > 137 134*  K 3.6   < > 2.9* 3.8  CL 98   < > 86* 89*  CO2 30   < > 36* 35*  GLUCOSE 300*   < > 183* 292*  BUN 34*   < > 43* 44*  CREATININE 2.32*   < > 2.48* 2.13*  CALCIUM 7.4*   < > 8.0* 7.7*  MG 1.8  --   --  2.2   < > = values in this interval not displayed.   Lipid Panel:     Component Value Date/Time   CHOL 167 06/29/2019 1341   TRIG 108 06/29/2019 1341   HDL 57 06/29/2019 1341   CHOLHDL 2.9 06/29/2019 1341   VLDL 23.2 02/19/2018 1003   LDLCALC 90 06/29/2019 1341   HgbA1c:  Lab Results  Component Value Date   HGBA1C 12.3 (H) 11/11/2019   Urine Drug Screen:     Component Value Date/Time   LABOPIA NONE DETECTED 11/11/2019 1500   COCAINSCRNUR NONE DETECTED 11/11/2019 1500   LABBENZ NONE DETECTED 11/11/2019 1500   AMPHETMU NONE DETECTED 11/11/2019 1500   THCU POSITIVE (A) 11/11/2019 1500   LABBARB NONE DETECTED 11/11/2019 1500    Alcohol Level     Component Value Date/Time   ETH <10 11/11/2019 1453    IMAGING past 24 hours CT HEAD WO CONTRAST  Result Date: 11/24/2019 CLINICAL DATA:  Acute stroke suspected EXAM: CT HEAD WITHOUT CONTRAST TECHNIQUE:  Contiguous axial images were obtained from the base of the skull through the vertex without intravenous contrast. COMPARISON:  Thirteen days ago FINDINGS: Brain: Large areas of acute or subacute appearing infarct are seen in the bilateral posterior frontal, parietal, and left posterior temporal regions. There may be some petechial hemorrhage along the cortex. No associated mass effect or hematoma. No hydrocephalus. Vascular: No hyperdense vessel. Skull: Negative Sinuses/Orbits: Bilateral mastoid opacification. Nasopharynx is opacified in the setting of intubation. Call has been placed to the ordering provider.  Reference Z vision. IMPRESSION: Acute or subacute bilateral MCA territory infarcts that are large. Mild petechial hemorrhage is likely present. Electronically Signed   By: Monte Fantasia M.D.   On: 11/24/2019 05:29   MR ANGIO HEAD WO CONTRAST  Result Date: 11/24/2019 CLINICAL DATA:  Stroke. EXAM: MRA HEAD WITHOUT CONTRAST TECHNIQUE: Angiographic images of the Circle of Willis were obtained using MRA technique without intravenous contrast. COMPARISON:  MRI head 11/24/2019 FINDINGS: Left vertebral  artery dominant and widely patent. Left PICA likely supplied by the left AICA. Hypoplastic right vertebral artery which is very small. Hypoplastic proximal basilar due to persistent trigeminal artery on the left. Superior cerebellar and posterior cerebral arteries patent bilaterally. Posterior communicating artery patent bilaterally. Internal carotid artery patent bilaterally without significant stenosis. Anterior and middle cerebral arteries patent bilaterally without significant stenosis Negative for cerebral aneurysm. 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 Electronically Signed   By: Franchot Gallo M.D.   On: 11/24/2019 10:42   MR BRAIN WO CONTRAST  Result Date: 11/24/2019 CLINICAL DATA:  Stroke. EXAM: MRI HEAD WITHOUT CONTRAST  TECHNIQUE: Multiplanar, multiecho pulse sequences of the brain and surrounding structures were obtained without intravenous contrast. COMPARISON:  CT head 11/24/2019 FINDINGS: Brain: Bilateral acute infarcts in the posterior MCA territory involving the parietal lobes bilaterally. Left infarct is slightly larger than the right extending into the superior temporal lobe on the left. No associated hemorrhage. No other areas of acute or chronic infarction. No mass or midline shift. Ventricle size normal. Vascular: Normal arterial flow voids. Skull and upper cervical spine: No focal skeletal abnormality. Sinuses/Orbits: Mild mucosal edema paranasal sinuses. Bilateral mastoid effusion. Negative orbit. Other: None IMPRESSION: Acute nonhemorrhagic infarcts in the posterior MCA territory bilaterally. Otherwise no evidence of chronic ischemia. Electronically Signed   By: Franchot Gallo M.D.   On: 11/24/2019 10:39   MR Venogram Head  Result Date: 11/24/2019 CLINICAL DATA:  Stroke EXAM: MR VENOGRAM OF THE HEAD WITHOUT CONTRAST TECHNIQUE: Angiographic images of the intracranial venous structures were obtained using MRV technique without intravenous contrast. COMPARISON:  MRI head 11/23/2019 FINDINGS: Superior sagittal sinus widely patent. Straight sinus widely patent. Transverse sinus and sigmoid sinus appear patent bilaterally. Artifact in the sigmoid sinus bilaterally. IMPRESSION: Negative MRV head. Electronically Signed   By: Franchot Gallo M.D.   On: 11/24/2019 11:06   DG Abd Portable 1V  Result Date: 11/24/2019 CLINICAL DATA:  OG tube placement. EXAM: PORTABLE ABDOMEN - 1 VIEW COMPARISON:  11/23/2019. FINDINGS: OG tube noted with tip over the stomach. Large amount of stool noted throughout the colon. No bowel distention or free air. Degenerative change lumbar spine. No acute bony abnormality identified. IMPRESSION: OG tube noted with tip over the stomach. Large amount of stool noted throughout the colon. Constipation  could present in this fashion. No bowel distention or free air. Electronically Signed   By: Marcello Moores  Register   On: 11/24/2019 07:13   VAS US CAROTID  Result Date: 11/24/2019 Carotid Arterial Duplex Study Indications:       CVA. Risk Factors:      Hypertension, hyperlipidemia, Diabetes. Limitations        Today's exam was limited due to the body habitus of the                    patient, the patient's inability or unwillingness to                    cooperate, patient on a ventilator and unable to hyperextend                    neck, trach collar. Comparison Study:  No prior exam. Performing Technologist: Baldwin Crown ARDMS, RVT  Examination Guidelines: A complete evaluation includes B-mode imaging, spectral Doppler, color Doppler, and power Doppler as needed of all accessible portions of each vessel. Bilateral testing is considered an integral part of a  complete examination. Limited examinations for reoccurring indications may be performed as noted.  Right Carotid Findings: +----------+-------+--------+--------+-----------------------+-----------------+           PSV    EDV cm/sStenosisPlaque Description     Comments                    cm/s                                                            +----------+-------+--------+--------+-----------------------+-----------------+ CCA Prox  88     19                                                       +----------+-------+--------+--------+-----------------------+-----------------+ CCA Distal73     17                                     intimal                                                                   thickening        +----------+-------+--------+--------+-----------------------+-----------------+ ICA Prox  55     14      1-39%   heterogenous and       Shadowing                                          calcific                                  +----------+-------+--------+--------+-----------------------+-----------------+ ICA Distal80     29                                                       +----------+-------+--------+--------+-----------------------+-----------------+ ECA       117    17                                                       +----------+-------+--------+--------+-----------------------+-----------------+ +----------+--------+-------+----------------+-------------------+           PSV cm/sEDV cmsDescribe        Arm Pressure (mmHG) +----------+--------+-------+----------------+-------------------+ [id number]            Multiphasic, WNL                    +----------+--------+-------+----------------+-------------------+ +---------+--------+--+--------+-+---------+ VertebralPSV cm/s44EDV cm/s9Antegrade +---------+--------+--+--------+-+---------+  Left Carotid  Findings: +----------+-------+--------+--------+-----------------------+-----------------+           PSV    EDV cm/sStenosisPlaque Description     Comments                    cm/s                                                            +----------+-------+--------+--------+-----------------------+-----------------+ CCA Prox  103    23                                                       +----------+-------+--------+--------+-----------------------+-----------------+ CCA Distal60     18                                     intimal                                                                   thickening        +----------+-------+--------+--------+-----------------------+-----------------+ ICA Prox  92     29      1-39%   heterogenous and       Shadowing                                          calcific                                 +----------+-------+--------+--------+-----------------------+-----------------+ ICA Distal108    33                                                        +----------+-------+--------+--------+-----------------------+-----------------+ ECA       296    68      >50%    heterogenous and                                                          calcific                                 +----------+-------+--------+--------+-----------------------+-----------------+ +----------+--------+--------+----------------+-------------------+           PSV cm/sEDV cm/sDescribe        Arm Pressure (mmHG) +----------+--------+--------+----------------+-------------------+ Subclavian119  Multiphasic, WNL                    +----------+--------+--------+----------------+-------------------+ +---------+--------+--+--------+--+---------+ VertebralPSV cm/s42EDV cm/s15Antegrade +---------+--------+--+--------+--+---------+   Summary: Right Carotid: Velocities in the right ICA are consistent with a 1-39% stenosis.                Non-hemodynamically significant plaque <50% noted in the CCA. Left Carotid: Velocities in the left ICA are consistent with a 1-39% stenosis.               Non-hemodynamically significant plaque <50% noted in the CCA. The               ECA appears >50% stenosed. Vertebrals:  Bilateral vertebral arteries demonstrate antegrade flow. Subclavians: Normal flow hemodynamics were seen in bilateral subclavian              arteries. *See table(s) above for measurements and observations.  Electronically signed by Deitra Mayo MD on 11/24/2019 at 1:07:35 PM.    Final    VAS [place] LOWER EXTREMITY VENOUS (DVT)  Result Date: 11/24/2019  Lower Venous DVTStudy Indications: Stroke.  Anticoagulation: Heparin. Limitations: Body habitus, poor ultrasound/tissue interface and vented, uncooperative. Comparison Study: No prior exam. Performing Technologist: Baldwin Crown ARDMS, RVT  Examination Guidelines: A complete evaluation includes B-mode imaging, spectral Doppler, color Doppler, and power Doppler as needed of all accessible  portions of each vessel. Bilateral testing is considered an integral part of a complete examination. Limited examinations for reoccurring indications may be performed as noted. The reflux portion of the exam is performed with the patient in reverse Trendelenburg.  +---------+---------------+---------+-----------+----------+------------------+ RIGHT    CompressibilityPhasicitySpontaneityPropertiesThrombus Aging     +---------+---------------+---------+-----------+----------+------------------+ CFV      Full           Yes      Yes                                     +---------+---------------+---------+-----------+----------+------------------+ SFJ      Full                                                            +---------+---------------+---------+-----------+----------+------------------+ FV Prox  Full                                                            +---------+---------------+---------+-----------+----------+------------------+ FV Mid   Full                                                            +---------+---------------+---------+-----------+----------+------------------+ FV DistalFull                                         visualized with  color              +---------+---------------+---------+-----------+----------+------------------+ PFV      Full                                                            +---------+---------------+---------+-----------+----------+------------------+ POP      Full           Yes      Yes                                     +---------+---------------+---------+-----------+----------+------------------+ PTV                                                   Not visualized     +---------+---------------+---------+-----------+----------+------------------+ PERO                                                  Not visualized      +---------+---------------+---------+-----------+----------+------------------+   +---------+---------------+---------+-----------+----------+------------------+ LEFT     CompressibilityPhasicitySpontaneityPropertiesThrombus Aging     +---------+---------------+---------+-----------+----------+------------------+ CFV      Full           Yes      Yes                                     +---------+---------------+---------+-----------+----------+------------------+ SFJ      Full                                                            +---------+---------------+---------+-----------+----------+------------------+ FV Prox  Full                                                            +---------+---------------+---------+-----------+----------+------------------+ FV Mid   Full                                                            +---------+---------------+---------+-----------+----------+------------------+ FV DistalFull                                         visualized with  color              +---------+---------------+---------+-----------+----------+------------------+ PFV      Full                                                            +---------+---------------+---------+-----------+----------+------------------+ POP      Full           Yes      Yes                                     +---------+---------------+---------+-----------+----------+------------------+ PTV      Full                                         poorly visualized  +---------+---------------+---------+-----------+----------+------------------+ PERO                                                  Not visualized     +---------+---------------+---------+-----------+----------+------------------+     Summary: RIGHT: - There is no evidence of deep vein thrombosis in the lower extremity. However,  portions of this examination were limited- see technologist comments above.  - No cystic structure found in the popliteal fossa.  LEFT: - There is no evidence of deep vein thrombosis in the lower extremity. However, portions of this examination were limited- see technologist comments above.  - No cystic structure found in the popliteal fossa.  *See table(s) above for measurements and observations. Electronically signed by Deitra Mayo MD on 11/24/2019 at 1:06:56 PM.    Final     PHYSICAL EXAM  Temp:  [97.8 F (36.6 C)-100.1 F (37.8 C)] 100.1 F (37.8 C) (03/23 0732) Pulse Rate:  [56-81] 81 (03/23 1400) Resp:  [12-31] 19 (03/23 1400) BP: (89-132)/(38-86) 115/54 (03/23 1400) SpO2:  [95 %-100 %] 99 % (03/23 1412) FiO2 (%):  [40 %-100 %] 40 % (03/23 1510) Weight:  [105.9 kg] 105.9 kg (03/23 0500)  General - Well nourished, well developed, intubated on sedation.  Ophthalmologic - fundi not visualized due to noncooperation.  Cardiovascular - Regular rate and rhythm.  Neuro - intubated on sedation, eyes closed but able to open with painful stimuli. Not following commands. With forced eye opening, eyes in mid position, not blinking to visual threat, doll's eyes sluggish, not tracking, PERRL. Corneal reflex present, gag and cough present. Breathing over the vent.  Facial symmetry not able to test due to ET tube.  Tongue protrusion not cooperative. On pain stimulation, BLE 2/5 withdraw R>L, RUE with finger movement on pain stimulation, LUE flaccid no movement with pain. However, as per RN, within MRI suite, pt was able to move all extremities. DTR 1+ and no babinski. Sensation, coordination and gait not tested.   ASSESSMENT/PLAN Susan [name] is a 59 y.o. female with history of HIV, IV drug abuse schizophrenia, seizure disorder admitted on 3/10 with ? overdose complicated by aspiration pneumonia with MRSA, pneumococcal bacteremia resulting in septic shock intubated for airway  protection noted to  have left-sided weakness once weaned off sedation.  Imaging shows B large MCA infarcts.   Stroke: moderate large B posterior MCA territory infarcts, etiology not quite clear. Several potential causes including recurrent hypotension with septic shock, cardiomyopathy, severe infection, uncontrolled risk factors including HIV and DM   CT head 3/10 normal  CT head 3/23 B large MCA territory infarcts w/ mild petechial hemorrhage   MRI  B posterior MCA territory infarcts  MRA  Unremarkable   MRV negative   2D Echo 3/11 EF 20-25%, small mobile density R coronary cusp  TEE Echo 3/16 EF 55-60%, no LAA thrombus, prominent node of Arancius on L coronary cusp. No vegetation/endocarditis. No PFO on color flow but no bubble study done  Carotid Doppler  B ICA 1-39% stenosis, VAs antegrade   LE Doppler no DVT  Hypercoagulable labs pending   LDL 52   HgbA1c 12.3  UDS positive for THC  Heparin 5000 units sq tid for VTE prophylaxis  No antithrombotic prior to admission, now on aspirin 81 mg daily.  Therapy recommendations:  Will need PT, OT and SLP for cognition and swallowing once off vent  Disposition:  pending   Cardiomyopathy, resolved  2D Echo 3/11 EF 20-25%  TEE Echo 3/16 EF 55-60%, no LAA thrombus. No PFO on color flow but no bubble study done  Acute Hypoxic/Hercapnic respiratory failure with ARDS from MRSA PNA and aspiration PNA  Prolonged intubation  On sedation, on vent  CCM onboard   Likely need trach placement, CCM to discuss w/ family  MRSA PNA and aspiration PNA, Pneumococcal bacteremia Septic shock  Completed abx course for MRSA, HCAP  BCx 3/10 + strep pneumo  TEE no endocarditis  ID signed off  Continue PCA through 3/23  Recurrent hypotension - SBP 70s to 80s intermittently throughout the hospitalization  HIV  On [place]cov[place] and [place]  CD4 = 471  Continue HARRT therapy  Hyperlipidemia  Home meds:  crestor 20, resumed in  hospital  LDL 52, goal < 70  Elevated AST = 42  Repeat LFT in am  Continue statin at discharge  Diabetes type II Uncontrolled  HgbA1c 12.3, goal < 7.0  CBGs  SSI  Hyperglycemia improving  On lantus  Close PCP follow up after discharge  AKI   in setting of septic shock and hypoxia  Cre 2.50->2.48->2.13  CCM on board  Dysphagia . Secondary to stroke . NPO . On TF    Other Stroke Risk Factors  Cigarette smoker  Hx IV drug abuse (heroin), current daily use of marijuana - UDS:  THC POSITIVE, Off Heroin per chart since 12/2008. Seizure d/t heroin withdrawal in 2010; on Keppra through 01/2011  Morbid Obesity, Body mass index is 41.36 kg/m., recommend weight loss, diet and exercise as appropriate   Hx TIA w/o residual. No other details available.   Migraines   Acute systolic HF w sepsis induced cardiomyopathy   Other Active Problems  Schizophrenia - on buspar, zoloft, doxepin  Seizure d/t heroin withdrawal in 2010; on Keppra through 01/2011  [facility] day # 13  This patient is critically ill due to b/l stroke, septic shock, cardiomyopathy, AKI, HIV, respiratory failure and at significant risk of neurological worsening, death form recurrent stroke, hemorrhagic conversion, renal failure, heart failure, seizure. This patient's care requires constant monitoring of vital signs, hemodynamics, respiratory and cardiac monitoring, review of multiple databases, neurological assessment, discussion with family, other specialists and medical decision making of high complexity. I spent 30 minutes of  neurocritical care time in the care of this patient.  Rosalin Hawking, MD PhD Stroke Neurology 11/24/2019 3:39 PM    To contact Stroke Continuity provider, please refer to [email or web address] After hours, contact General Neurology

## 2019-11-24 NOTE — Progress Notes (Signed)
Patient was transported to MRI & back to 2M13 without any complications.

## 2019-11-24 NOTE — Progress Notes (Signed)
Late Entry:  Patient transported to CT scan and back to 2M13 without incidence.

## 2019-11-24 NOTE — Progress Notes (Signed)
[place] Progress Note Patient Name: HAFSAH TRAUSCH DOB: 01/25/1961 MRN: [id number]   Date of Service  11/24/2019  HPI/Events of Note  Pt with  Focal neurologic finding r/o CVA  eICU Interventions  Stat non-contrast head CT ordered to r/o CVA        Frederik Pear 11/24/2019, 4:43 AM
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
## 2019-11-24 NOTE — Progress Notes (Signed)
[facility] Progress Note Patient Name: Susan [name] DOB: 04/11/1961 MRN: [id number]   Date of Service  11/24/2019  HPI/Events of Note  Pt needs a.m. labs  eICU Interventions  A.m. labs ordered.        [name] [name] 11/24/2019, 3:34 AM

## 2019-11-24 NOTE — Progress Notes (Signed)
Patient with respiratory rate in the 50s, constantly alarming on vent. Copious secretions through vent noted with ETT suctioning. Spoke with RT and placed patient back on full vent support at this time.

## 2019-11-24 NOTE — Progress Notes (Signed)
Bilateral lower extremity venous duplex and carotid artery duplex exams completed.  Preliminary results can be found under CV proc under chart review.  11/24/2019 12:52 PM  Tyris Eliot, K., RDMS, RVT

## 2019-11-25 DIAGNOSIS — N179 Acute kidney failure, unspecified: Secondary | ICD-10-CM | POA: Diagnosis not present

## 2019-11-25 DIAGNOSIS — J9601 Acute respiratory failure with hypoxia: Secondary | ICD-10-CM | POA: Diagnosis not present

## 2019-11-25 DIAGNOSIS — G9341 Metabolic encephalopathy: Secondary | ICD-10-CM | POA: Diagnosis not present

## 2019-11-25 DIAGNOSIS — I63413 Cerebral infarction due to embolism of bilateral middle cerebral arteries: Secondary | ICD-10-CM | POA: Diagnosis not present

## 2019-11-25 DIAGNOSIS — N170 Acute kidney failure with tubular necrosis: Secondary | ICD-10-CM | POA: Diagnosis not present

## 2019-11-25 LAB — GLUCOSE, CAPILLARY
Glucose-Capillary: 109 mg/dL — ABNORMAL HIGH (ref 70–99)
Glucose-Capillary: 115 mg/dL — ABNORMAL HIGH (ref 70–99)
Glucose-Capillary: 121 mg/dL — ABNORMAL HIGH (ref 70–99)
Glucose-Capillary: 182 mg/dL — ABNORMAL HIGH (ref 70–99)
Glucose-Capillary: 186 mg/dL — ABNORMAL HIGH (ref 70–99)
Glucose-Capillary: 187 mg/dL — ABNORMAL HIGH (ref 70–99)
Glucose-Capillary: 50 mg/dL — ABNORMAL LOW (ref 70–99)
Glucose-Capillary: 54 mg/dL — ABNORMAL LOW (ref 70–99)
Glucose-Capillary: 67 mg/dL — ABNORMAL LOW (ref 70–99)
Glucose-Capillary: 82 mg/dL (ref 70–99)

## 2019-11-25 LAB — CBC
HCT: 29.7 % — ABNORMAL LOW (ref 36.0–46.0)
Hemoglobin: 9.5 g/dL — ABNORMAL LOW (ref 12.0–15.0)
MCH: 27.2 pg (ref 26.0–34.0)
MCHC: 32 g/dL (ref 30.0–36.0)
MCV: 85.1 fL (ref 80.0–100.0)
Platelets: 371 10*3/uL (ref 150–400)
RBC: 3.49 MIL/uL — ABNORMAL LOW (ref 3.87–5.11)
RDW: 15.4 % (ref 11.5–15.5)
WBC: 16.4 10*3/uL — ABNORMAL HIGH (ref 4.0–10.5)
nRBC: 0 % (ref 0.0–0.2)

## 2019-11-25 LAB — BETA-2-GLYCOPROTEIN I ABS, IGG/M/A
Beta-2 Glyco I IgG: <9 GPI IgG units (ref 0–20)
Beta-2-Glycoprotein I IgA: <9 GPI IgA units (ref 0–25)
Beta-2-Glycoprotein I IgM: <9 GPI IgM units (ref 0–32)

## 2019-11-25 LAB — BASIC METABOLIC PANEL
Anion gap: 11 (ref 5–15)
Anion gap: 11 (ref 5–15)
BUN: 44 mg/dL — ABNORMAL HIGH (ref 6–20)
BUN: 36 mg/dL — ABNORMAL HIGH (ref 6–20)
CO2: 37 mmol/L — ABNORMAL HIGH (ref 22–32)
CO2: 33 mmol/L — ABNORMAL HIGH (ref 22–32)
Calcium: 8.2 mg/dL — ABNORMAL LOW (ref 8.9–10.3)
Calcium: 6.9 mg/dL — ABNORMAL LOW (ref 8.9–10.3)
Chloride: 93 mmol/L — ABNORMAL LOW (ref 98–111)
Chloride: 102 mmol/L (ref 98–111)
Creatinine, Ser: 2.01 mg/dL — ABNORMAL HIGH (ref 0.44–1.00)
Creatinine, Ser: 1.59 mg/dL — ABNORMAL HIGH (ref 0.44–1.00)
GFR calc Af Amer: 31 mL/min — ABNORMAL LOW (ref >60)
GFR calc Af Amer: 41 mL/min — ABNORMAL LOW (ref >60)
GFR calc non Af Amer: 27 mL/min — ABNORMAL LOW (ref >60)
GFR calc non Af Amer: 36 mL/min — ABNORMAL LOW (ref >60)
Glucose, Bld: 123 mg/dL — ABNORMAL HIGH (ref 70–99)
Glucose, Bld: 123 mg/dL — ABNORMAL HIGH (ref 70–99)
Potassium: 2.8 mmol/L — ABNORMAL LOW (ref 3.5–5.1)
Potassium: 2.5 mmol/L — CL (ref 3.5–5.1)
Sodium: 141 mmol/L (ref 135–145)
Sodium: 146 mmol/L — ABNORMAL HIGH (ref 135–145)

## 2019-11-25 LAB — CARDIOLIPIN ANTIBODIES, IGG, IGM, IGA
Anticardiolipin IgA: <9 APL U/mL (ref 0–11)
Anticardiolipin IgG: 9 GPL U/mL (ref 0–14)
Anticardiolipin IgM: <9 MPL U/mL (ref 0–12)

## 2019-11-25 LAB — HEPATIC FUNCTION PANEL
ALT: 35 U/L (ref 0–44)
AST: 46 U/L — ABNORMAL HIGH (ref 15–41)
Albumin: 1.2 g/dL — ABNORMAL LOW (ref 3.5–5.0)
Alkaline Phosphatase: 288 U/L — ABNORMAL HIGH (ref 38–126)
Bilirubin, Direct: <0.1 mg/dL (ref 0.0–0.2)
Total Bilirubin: 0.6 mg/dL (ref 0.3–1.2)
Total Protein: 7 g/dL (ref 6.5–8.1)

## 2019-11-25 LAB — HOMOCYSTEINE: Homocysteine: 11.4 umol/L (ref 0.0–14.5)

## 2019-11-25 LAB — LUPUS ANTICOAGULANT PANEL
DRVVT: 32.4 s (ref 0.0–47.0)
PTT Lupus Anticoagulant: 31.8 s (ref 0.0–51.9)

## 2019-11-25 LAB — RPR: RPR Ser Ql: NONREACTIVE

## 2019-11-25 MED ORDER — DEXTROSE 50 % IV SOLN
INTRAVENOUS | Status: AC
  Administered 2019-11-25: 50 mL
  Filled 2019-11-25: qty 50

## 2019-11-25 MED ORDER — POTASSIUM CHLORIDE 20 MEQ/15ML (10%) PO SOLN
40.0000 meq | Freq: Two times a day (BID) | ORAL | Status: AC
  Administered 2019-11-25: 40 meq
  Filled 2019-11-25: qty 30

## 2019-11-25 MED ORDER — POTASSIUM CHLORIDE 20 MEQ/15ML (10%) PO SOLN
40.0000 meq | ORAL | Status: AC
  Administered 2019-11-25 – 2019-11-26 (×2): 40 meq
  Filled 2019-11-25 (×2): qty 30

## 2019-11-25 MED ORDER — INSULIN GLARGINE 100 UNIT/ML ~~LOC~~ SOLN
12.0000 [IU] | Freq: Two times a day (BID) | SUBCUTANEOUS | Status: DC
  Administered 2019-11-25 – 2019-11-30 (×11): 12 [IU] via SUBCUTANEOUS
  Filled 2019-11-25 (×12): qty 0.12

## 2019-11-25 MED ORDER — INSULIN GLARGINE 100 UNIT/ML ~~LOC~~ SOLN
12.0000 [IU] | Freq: Two times a day (BID) | SUBCUTANEOUS | Status: DC
  Filled 2019-11-25: qty 0.12

## 2019-11-25 MED ORDER — INSULIN ASPART 100 UNIT/ML ~~LOC~~ SOLN
10.0000 [IU] | SUBCUTANEOUS | Status: DC
  Administered 2019-11-25 (×3): 10 [IU] via SUBCUTANEOUS

## 2019-11-25 MED ORDER — FENTANYL CITRATE (PF) 100 MCG/2ML IJ SOLN
50.0000 ug | INTRAMUSCULAR | Status: DC | PRN
  Administered 2019-11-25 – 2019-11-26 (×9): 50 ug via INTRAVENOUS
  Filled 2019-11-25 (×9): qty 2

## 2019-11-25 MED ORDER — DEXTROSE 50 % IV SOLN
INTRAVENOUS | Status: AC
  Administered 2019-11-25: 08:00:00 25 g via INTRAVENOUS
  Filled 2019-11-25: qty 50

## 2019-11-25 MED ORDER — DEXTROSE 50 % IV SOLN: 12.5000 g | Freq: Once | INTRAVENOUS | Status: AC

## 2019-11-25 NOTE — Plan of Care (Signed)

## 2019-11-25 NOTE — Progress Notes (Signed)
Pts sister called.  Passcode provided.  Updated on pt status. All questions answered.

## 2019-11-25 NOTE — Progress Notes (Signed)
NAME:  Susan [name], MRN:  [id number], DOB:  [birth year], LOS: 55 ADMISSION DATE:  11/11/2019, CONSULTATION DATE:  3/10 REFERRING MD:  Sabra Heck, CHIEF COMPLAINT:  Acute encephalopathy and respiratory failure    Brief History   59 year old female smoker admitted 3/10 with overdose complicated by aspiration PNA with MRSA,  Pneumococcal bacteremia, toxic metabolic encephalopathy and septic shock.    Past Medical History  Frequent falls at home,Tobacco abuse, diabetes with painful polyneuropathy, bipolar disease, GERD, HIV, schizophrenia, history of IV drug abuse, seizure disorder.   Significant Hospital Events   3/10 Admit with overdose, intubated on arrival, in shock   3/15 Ventilator asynchrony, heavy sedation & intermittent paralysis   3/16 Lasix drip.  Awaiting TEE.  Sputum w/ MRSA.   Coming back down on sedation, changed to [facility]. TEE showing NO EVIDENCE of endocarditis. ABX changed to pen G per ID. MRSA therapy complete. EF improved. Now up to 50% from 20-25% 3/17 Heavily sedated. Having episodes of bradycardia. Still massively overloaded (>14L), increasing lasix gtt to 8 mg from 4, adding metolazone.  3/19 Lasix to 40 every 12, changing RASS goal to -2, decrease in fentanyl, decreasing PEEP, anticipating trialing pressure support ventilation.  Watching upward trend of white blood cell count and low-grade fever 3/20 BM early am. Agitation early am > sedate post am PT psy meds 3/22 Decreased mentation and movement of extremities overnight, sent for Head CT which revealed acute vs subacute bilateral MCA infarcts  3/23 Lying in bed seen spontaneously moving lower and right upper extremity, unable to follow any commands.   Consults:  ID Cards for TEE Neurology   Procedures:  ETT 3/10 >>  R Fem TLC (ER) 3/10  R IJ TLC 3/10  L IJ TLC 3/10 >> 3/17  RUE PICC 3/16 >>   Significant Diagnostic Tests:   CT brain 3/10 >> Normal head CT  EEG 3/10 >> Moderate to severe diffuse encephalopathy.  No seizures  Urine drug screen 3/10 >> positive for THC, acetaminophen Q000111Q, salicylate <7, ETOH Q000111Q  ECHO 3/11 >> EF 20-25%, LV function severely diminished, Global Hypokinesis,LV hypertrophy, Grade 1 Diastolic Dysfunction, Elevated PASP, Dilated LA, MV regurgitation, small mobile density right coronary cusp  TEE 3/16 >> LVEF 55%, No LA/LAA thrombus or masses, Previously noted mass on the aortic valve is a node of Arancius.  Normal variant and no evidence of endocarditis.Trivial TR and trivial PR 3/23 MRI brain >>Acute nonhemorrhagic infarcts in the posterior MCA territory bilaterally. Otherwise no evidence of chronic ischemia. 3/23 MRA head >>No significant intracranial stenosis.  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 3/23 MRV head >> negative  Micro Data:  Influenza A/B 3/10 >> negative  COVID 3/10 >> negative  MRSA PCR 3/10 >> positive  BCx2 3/10 >> streptococcus pneumoniae >> S-rocephin BCx1 (aerobic only) 3/10 >> negative  Tracheal aspirate 3/11 >> MRSA BCx2 3/12 >> negative   Antimicrobials:  Vancomycin 3/10 >> 3/16 Unasyn 3/10 >> 3/16 Pen G 3/16 >>   Interim history/subjective:  Hypoglycemic episodes overnight.  No reports of TF held Weaning well 10/5 today  tmax 101.5 Remains on precedex gtt 1.2, off fentanyl, ongoing intermittent agitation/ tachypnea  Objective   Blood pressure 137/74, pulse 94, temperature 100.2 F (37.9 C), temperature source Axillary, resp. rate (!) 27, height 5\' 3"  (1.6 m), weight 105.9 kg, SpO2 98 %.    Vent Mode: PSV;CPAP FiO2 (%):  [40 %] 40 % Set Rate:  [22 bmp] 22 bmp Vt Set:  [  420 mL] 420 mL PEEP:  [5 cmH20-8 cmH20] 5 cmH20 Pressure Support:  [10 [id number] cmH20] 10 cmH20 Plateau Pressure:  [17 cmH20-25 cmH20] 23 cmH20   Intake/Output Summary (Last 24 hours) at 11/25/2019 1150 Last data filed at 11/25/2019 1100 Gross per 24 hour  Intake 3593.4 ml  Output 3750 ml  Net -156.6 ml   Filed Weights   11/23/19  0500 11/24/19 0500 11/25/19 0500  Weight: 105.6 kg 105.9 kg 105.9 kg   Examination: General:  Chronically ill appearing adult female lying in bed mildly agitated HEENT: MM pink/moist, pupils unable to assess due to patient resisting, ETT 8.0 at 19,  Neuro: Alert, moves all extremities equally requiring restraints but not f/c  CV: RR, no murmur PULM:  Currently on PSV 10/5 doing well, slightly tachypneic but also agitated, coarse throughout GI: obese, +bs, soft/ NT, purwick  Extremities: warm/dry, no LE Skin: no rashes   Resolved Hospital Problem list   Rhabdomyolysis  Elevated INR Septic shock   Assessment & Plan:   Acute hypoxic/hypercapnic respiratory failure with ARDS from MRSA PNA and aspiration pneumonia. Hx of asthma. Plan: Continue full MV support with daily SBT, possibly tomorrow will be better day for extubation trial.   However given her large b/l MCA strokes and prolonged hospital course, concerned that she will likely need a tracheostomy long term  Intermittent CXR/ ABG VAP bundle  duonebs q 6 and prn   Acute bilateral large bilateral posterior MCA infarcts  -Patient was seen with neuo status change 3/23 including inability to follow commands that was seen to worsen overnight. Stat head CT scan was obtain that revealed bilateral MCA infarctions that are large in size and well established  -TEE 3/16 with no sign of endocarditis  Plan: Neurology following, appreciate assistance  Improving neuro exam today but still not following commands MRI brain c/w b/l posterior MCA territory infarcts; MRA/ MRV neg Allow for permissive hypertension  Hold ASA PT/ OT when able   MRSA and aspiration pneumonia, and Pneumococcal bacteremia -Completed ABx for MRSA HCAP 3/16 -ID s/o as of 3/20 Plan: Completed Penicillin on 3/23 Trend WBC/ fever curve   Hx of HIV Plan: Continue home Descovy and Tivicay   Acute systolic heart failure with sepsis induced CM. Hx of HTN,  HLD. Plan: Holding home beta blocker, clonidine, norvasc, vasotec, and maxide on hold Continue crestor   Acute metabolic encephalopathy 2nd to sepsis, hypoxia and delirium  Hx of schizophrenia, seizures. Plan: RASS goal 0 to -1 continue precedex, change fentanyl gtt to PRN  Wean sedation as able  Continue Cogentin, Buspar, Doxepin, Neurotin, and Zoloft  Delirium precaution   Acute Kidney Injury 2nd to ATN in setting of septic shock and hypoxia. Hypokalemia. - baseline creatinine 0.78 from 06/29/19 Plan: KCL 40 meq BID x 2 Repeat BMP this afternoon  Mag 2.2 yest Stable sCr  Consider restarting diuresis when K is improved  Strict I/Os/ trend renal function   Constipation. Plan: Continue bowel regiment   DM type II poorly controlled with hyperglycemia. - HbA1C 12.3 from 11/11/19 Plan: Unclear why hypoglycemic overnight.  Lantus was increased yesterday from 12->15 Resistant SSI  Decrease lantus back to 12 units BID  Decrease TF coverage to 10 units q 4  GOC  Ongoing.  Called and updated patient's daughter Thayer Headings.  States she is planning on discussing options, including trach hopefully tonight or tomorrow with her brothers.    Best practice:  Diet: tube feeds DVT prophylaxis: SQ heparin GI prophylaxis:  protonix Mobility: BR Code Status: full code  Family Communication: Daughter Aquinna Haskins updated by phone 3/24 Disposition: to ICU   Labs:   CMP Latest Ref Rng & Units 11/25/2019 11/24/2019 11/23/2019  Glucose 70 - 99 mg/dL 123(H) 292(H) 183(H)  BUN 6 - 20 mg/dL 44(H) 44(H) 43(H)  Creatinine 0.44 - 1.00 mg/dL 2.01(H) 2.13(H) 2.48(H)  Sodium 135 - 145 mmol/L 141 134(L) 137  Potassium 3.5 - 5.1 mmol/L 2.8(L) 3.8 2.9(L)  Chloride 98 - 111 mmol/L 93(L) 89(L) 86(L)  CO2 22 - 32 mmol/L 37(H) 35(H) 36(H)  Calcium 8.9 - 10.3 mg/dL 8.2(L) 7.7(L) 8.0(L)  Total Protein 6.5 - 8.1 g/dL 7.0 - -  Total Bilirubin 0.3 - 1.2 mg/dL 0.6 - -  Alkaline Phos 38 - 126 U/L 288(H) - -   AST 15 - 41 U/L 46(H) - -  ALT 0 - 44 U/L 35 - -    CBC Latest Ref Rng & Units 11/25/2019 11/24/2019 11/23/2019  WBC 4.0 - 10.5 K/uL 16.4(H) 14.8(H) 12.6(H)  Hemoglobin 12.0 - 15.0 g/dL 9.5(L) 9.7(L) 9.6(L)  Hematocrit 36.0 - 46.0 % 29.7(L) 30.5(L) 30.6(L)  Platelets 150 - 400 K/uL 371 300 260    ABG    Component Value Date/Time   PHART 7.390 11/22/2019 2027   PCO2ART 70.6 (HH) 11/22/2019 2027   PO2ART 50.0 (L) 11/22/2019 2027   HCO3 42.6 (H) 11/22/2019 2027   TCO2 45 (H) 11/22/2019 2027   ACIDBASEDEF 4.0 (H) 11/12/2019 1504   O2SAT 81.0 11/22/2019 2027    CBG (last 3)  Recent Labs    11/25/19 0757 11/25/19 0818 11/25/19 1142  GLUCAP 54* 109* 182*    CRITICAL CARE Performed by: Kennieth Rad   Total critical care time: 40 minutes   Critical care time was exclusive of separately billable procedures and treating other patients.   Critical care was necessary to treat or prevent imminent or life-threatening deterioration.   Critical care was time spent personally by me on the following activities: development of treatment plan with patient and/or surrogate as well as nursing, discussions with consultants, evaluation of patient's response to treatment, examination of patient, obtaining history from patient or surrogate, ordering and performing treatments and interventions, ordering and review of laboratory studies, ordering and review of radiographic studies, pulse oximetry and re-evaluation of patient's condition.  Kennieth Rad, MSN, AGACNP-BC [facility] 11/25/2019, 11:50 AM

## 2019-11-25 NOTE — Progress Notes (Signed)
CRITICAL VALUE ALERT  Critical Value:  2.5  Date & Time Notied:  11/25/2019 2151  Provider Notified: E-link  Orders Received/Actions taken: Will continue to monitor

## 2019-11-25 NOTE — Progress Notes (Signed)
STROKE TEAM PROGRESS NOTE   INTERVAL HISTORY RN and RT are at bedside.  Pt did not tolerate weaning this am and RR went up to 50s, put back on vent. However, pt eyes open and tracking bilaterally, more interactive than yesterday, but still not following commands, not consistently blinking eyes for visual threat. LUE has some movement today. However, may need trach after discussion with Dr. Lamonte Sakai.   Vitals:   11/25/19 1145 11/25/19 1200 11/25/19 1215 11/25/19 1309  BP:  128/68    Pulse: 100 98 98   Resp: (!) 25 (!) 28 (!) 24   Temp:      TempSrc:      SpO2: 95% 96% 96% 97%  Weight:      Height:        CBC:  Recent Labs  Lab 11/24/19 0403 11/25/19 0500  WBC 14.8* 16.4*  NEUTROABS 10.2*  --   HGB 9.7* 9.5*  HCT 30.5* 29.7*  MCV 85.7 85.1  PLT 300 [id number]    Basic Metabolic Panel:  Recent Labs  Lab 11/19/19 0500 11/19/19 1655 11/24/19 0403 11/25/19 0500  NA 143   < > 134* 141  K 3.6   < > 3.8 2.8*  CL 98   < > 89* 93*  CO2 30   < > 35* 37*  GLUCOSE 300*   < > 292* 123*  BUN 34*   < > 44* 44*  CREATININE 2.32*   < > 2.13* 2.01*  CALCIUM 7.4*   < > 7.7* 8.2*  MG 1.8  --  2.2  --    < > = values in this interval not displayed.   Lipid Panel:     Component Value Date/Time   CHOL 99 11/24/2019 1240   TRIG 166 (H) 11/24/2019 1240   HDL 14 (L) 11/24/2019 1240   CHOLHDL 7.1 11/24/2019 1240   VLDL 33 11/24/2019 1240   LDLCALC 52 11/24/2019 1240   LDLCALC 90 06/29/2019 1341   HgbA1c:  Lab Results  Component Value Date   HGBA1C 12.3 (H) 11/11/2019   Urine Drug Screen:     Component Value Date/Time   LABOPIA NONE DETECTED 11/11/2019 1500   COCAINSCRNUR NONE DETECTED 11/11/2019 1500   LABBENZ NONE DETECTED 11/11/2019 1500   AMPHETMU NONE DETECTED 11/11/2019 1500   THCU POSITIVE (A) 11/11/2019 1500   LABBARB NONE DETECTED 11/11/2019 1500    Alcohol Level     Component Value Date/Time   ETH <10 11/11/2019 1453    IMAGING past 24 hours No results  found.  PHYSICAL EXAM   Temp:  [99.4 F (37.4 C)-101.5 F (38.6 C)] 100.2 F (37.9 C) (03/24 1143) Pulse Rate:  [25-101] 98 (03/24 1215) Resp:  [13-32] 24 (03/24 1215) BP: (85-137)/(41-75) 128/68 (03/24 1200) SpO2:  [95 %-100 %] 97 % (03/24 1309) FiO2 (%):  [40 %] 40 % (03/24 1310) Weight:  [105.9 kg] 105.9 kg (03/24 0500)  General - Well nourished, well developed, intubated on precedex.  Ophthalmologic - fundi not visualized due to noncooperation.  Cardiovascular - Regular rate and rhythm.  Neuro - intubated on precedex, eyes spontaneously open but not following commands. However, eyes moving bilaterally, tracking objects, however not consistently blinking to visual threat bilaterally, PERRL. Corneal reflex present, gag and cough present. Breathing over the vent.  Facial symmetry not able to test due to ET tube.  Tongue protrusion not cooperative. Spontaneously moving RUE against gravity and LUE finger movement. On pain stimulation, BLE 2+/5 withdraw, LUE 2/5  withdraw. DTR 1+ and no babinski. Sensation, coordination and gait not tested.   ASSESSMENT/PLAN Susan [name] is a 59 y.o. female with history of HIV, IV drug abuse schizophrenia, seizure disorder admitted on 3/10 with ? overdose complicated by aspiration pneumonia with MRSA, pneumococcal bacteremia resulting in septic shock intubated for airway protection noted to have left-sided weakness once weaned off sedation.  Imaging shows B large MCA infarcts.   Stroke: moderate large B posterior MCA territory infarcts, etiology not quite clear. Several potential causes including recurrent hypotension with septic shock, cardiomyopathy, severe infection, uncontrolled risk factors including HIV and DM   CT head 3/10 normal  CT head 3/23 B large MCA territory infarcts w/ mild petechial hemorrhage   MRI  B posterior MCA territory infarcts  MRA  Unremarkable   MRV negative   2D Echo 3/11 EF 20-25%, small mobile density R  coronary cusp  TEE Echo 3/16 EF 55-60%, no LAA thrombus, prominent node of Arancius on L coronary cusp. No vegetation/endocarditis. No PFO on color flow but no bubble study done  Carotid Doppler  B ICA 1-39% stenosis, VAs antegrade   LE Doppler no DVT  Hypercoagulable labs pending   LDL 52   HgbA1c 12.3  UDS positive for THC  Heparin 5000 units sq tid for VTE prophylaxis  No antithrombotic prior to admission, now on aspirin 81 mg daily. No DAPT for now given possible procedures.   Therapy recommendations:  Will need PT, OT and SLP for cognition and swallowing once off vent  Disposition:  pending   Cardiomyopathy, resolved  2D Echo 3/11 EF 20-25%  TEE Echo 3/16 EF 55-60%, no LAA thrombus. No PFO on color flow but no bubble study done  Acute Hypoxic/Hercapnic respiratory failure with ARDS from MRSA PNA and aspiration PNA  Prolonged intubation  On precedex and back on vent  Failed weaning  CCM onboard   Likely need trach placement if plan for aggressive care  MRSA PNA and aspiration PNA, Pneumococcal bacteremia Septic shock  Completed abx course for MRSA, HCAP  BCx 3/10 + strep pneumo  TEE no endocarditis  ID signed off  Continue PCA through 3/23  Recurrent hypotension - SBP 70s to 80s intermittently throughout the hospitalization  HIV  On Descovy and Tivicay  CD4 = 471  Continue HARRT therapy  Hyperlipidemia  Home meds:  crestor 20, resumed in hospital  LDL 52, goal < 70  Elevated AST = 42->46  Continue statin at discharge  Diabetes type II Uncontrolled  HgbA1c 12.3, goal < 7.0  CBGs  SSI  Hyperglycemia improving  hypolglycemic readings 3/24  On lantus  Close PCP follow up after discharge  AKI   in setting of septic shock and hypoxia  Cre 2.50->2.48->2.13->2.01  CCM on board  Dysphagia . Secondary to stroke . NPO . On TF    Other Stroke Risk Factors  Cigarette smoker  Hx IV drug abuse (heroin), current daily use  of marijuana - UDS:  THC POSITIVE, Off Heroin per chart since 12/2008. Seizure d/t heroin withdrawal in 2010; on Keppra through 01/2011  Morbid Obesity, Body mass index is 41.36 kg/m., recommend weight loss, diet and exercise as appropriate   Hx TIA w/o residual. No other details available.   Migraines   Acute systolic HF w sepsis induced cardiomyopathy   Other Active Problems  Schizophrenia - on buspar, zoloft, doxepin  Seizure d/t heroin withdrawal in 2010; on Keppra through 01/2011  Constipation   Elevated VB12 1476  Hospital day # 14  This patient is critically ill due to b/l stroke, septic shock, cardiomyopathy, AKI, HIV, respiratory failure and at significant risk of neurological worsening, death form recurrent stroke, hemorrhagic conversion, renal failure, heart failure, seizure. This patient's care requires constant monitoring of vital signs, hemodynamics, respiratory and cardiac monitoring, review of multiple databases, neurological assessment, discussion with family, other specialists and medical decision making of high complexity. I spent 30 minutes of neurocritical care time in the care of this patient. I have discussed with Dr. Lamonte Sakai.   Susan Hawking, MD PhD Stroke Neurology 11/25/2019 1:37 PM    To contact Stroke Continuity provider, please refer to [email or web address] After hours, contact General Neurology

## 2019-11-25 NOTE — Progress Notes (Signed)
[facility] Progress Note Patient Name: Susan [name] DOB: 03-05-1961 MRN: [id number]   Date of Service  11/25/2019  HPI/Events of Note  Pt on high doses of Precedex due to bedside RN concern that PRN Fentanyl doses were inducing hypotension.  eICU Interventions  RN instructed to titrate Precedex dose down to moderate levels and use PRN Fentanyl for transient agitation, as the hemodynamic impact of 50 mcg of Fentanyl is likely to be modest with moderate Precedex dosing.        Frederik Pear 11/25/2019, 9:17 PM

## 2019-11-25 NOTE — Progress Notes (Addendum)
[facility] Progress Note Patient Name: Susan [name] DOB: Mar 19, 1961 MRN: [id number]   Date of Service  11/25/2019  HPI/Events of Note  K+ 2.5, GFR 41  eICU Interventions  KCL 40 meq via NG tube Q 4 hours x 2 doses.        [name] Carlus Stay 11/25/2019, 9:59 PM

## 2019-11-25 NOTE — Progress Notes (Signed)
Notified Dr Lamonte Sakai that pt was persistently restless and agitated and setting off ventilator for tachypnea/high pressures.  Currently on 1.2 mcg/kg/min of precedex, which is max.  3 x 50 mcg fentanyl boluses given with no response.  MD to change predecdex paramaters.  Keep off fentanyl drip for now.

## 2019-11-26 DIAGNOSIS — N179 Acute kidney failure, unspecified: Secondary | ICD-10-CM | POA: Diagnosis not present

## 2019-11-26 DIAGNOSIS — J690 Pneumonitis due to inhalation of food and vomit: Secondary | ICD-10-CM | POA: Diagnosis not present

## 2019-11-26 DIAGNOSIS — N170 Acute kidney failure with tubular necrosis: Secondary | ICD-10-CM | POA: Diagnosis not present

## 2019-11-26 DIAGNOSIS — G9341 Metabolic encephalopathy: Secondary | ICD-10-CM | POA: Diagnosis not present

## 2019-11-26 DIAGNOSIS — J9601 Acute respiratory failure with hypoxia: Secondary | ICD-10-CM | POA: Diagnosis not present

## 2019-11-26 LAB — CBC
HCT: 26.6 % — ABNORMAL LOW (ref 36.0–46.0)
Hemoglobin: 8.3 g/dL — ABNORMAL LOW (ref 12.0–15.0)
MCH: 27.3 pg (ref 26.0–34.0)
MCHC: 31.2 g/dL (ref 30.0–36.0)
MCV: 87.5 fL (ref 80.0–100.0)
Platelets: 344 10*3/uL (ref 150–400)
RBC: 3.04 MIL/uL — ABNORMAL LOW (ref 3.87–5.11)
RDW: 15.6 % — ABNORMAL HIGH (ref 11.5–15.5)
WBC: 13.5 10*3/uL — ABNORMAL HIGH (ref 4.0–10.5)
nRBC: 0.1 % (ref 0.0–0.2)

## 2019-11-26 LAB — URINALYSIS, ROUTINE W REFLEX MICROSCOPIC
Bacteria, UA: NONE SEEN
Bilirubin Urine: NEGATIVE
Glucose, UA: NEGATIVE mg/dL
Hgb urine dipstick: NEGATIVE
Ketones, ur: NEGATIVE mg/dL
Leukocytes,Ua: NEGATIVE
Nitrite: NEGATIVE
Protein, ur: 30 mg/dL — AB
Specific Gravity, Urine: 1.011 (ref 1.005–1.030)
pH: 8 (ref 5.0–8.0)

## 2019-11-26 LAB — BASIC METABOLIC PANEL
Anion gap: 11 (ref 5–15)
BUN: 36 mg/dL — ABNORMAL HIGH (ref 6–20)
CO2: 35 mmol/L — ABNORMAL HIGH (ref 22–32)
Calcium: 7.7 mg/dL — ABNORMAL LOW (ref 8.9–10.3)
Chloride: 99 mmol/L (ref 98–111)
Creatinine, Ser: 1.72 mg/dL — ABNORMAL HIGH (ref 0.44–1.00)
GFR calc Af Amer: 38 mL/min — ABNORMAL LOW (ref >60)
GFR calc non Af Amer: 32 mL/min — ABNORMAL LOW (ref >60)
Glucose, Bld: 102 mg/dL — ABNORMAL HIGH (ref 70–99)
Potassium: 3.1 mmol/L — ABNORMAL LOW (ref 3.5–5.1)
Sodium: 145 mmol/L (ref 135–145)

## 2019-11-26 LAB — HEMOGLOBIN AND HEMATOCRIT, BLOOD
HCT: 29.3 % — ABNORMAL LOW (ref 36.0–46.0)
Hemoglobin: 9.2 g/dL — ABNORMAL LOW (ref 12.0–15.0)

## 2019-11-26 LAB — MAGNESIUM: Magnesium: 2 mg/dL (ref 1.7–2.4)

## 2019-11-26 LAB — GLUCOSE, CAPILLARY
Glucose-Capillary: 104 mg/dL — ABNORMAL HIGH (ref 70–99)
Glucose-Capillary: 146 mg/dL — ABNORMAL HIGH (ref 70–99)
Glucose-Capillary: 161 mg/dL — ABNORMAL HIGH (ref 70–99)
Glucose-Capillary: 183 mg/dL — ABNORMAL HIGH (ref 70–99)
Glucose-Capillary: 251 mg/dL — ABNORMAL HIGH (ref 70–99)
Glucose-Capillary: 88 mg/dL (ref 70–99)

## 2019-11-26 MED ORDER — INSULIN ASPART 100 UNIT/ML ~~LOC~~ SOLN
5.0000 [IU] | SUBCUTANEOUS | Status: DC
  Administered 2019-11-26 – 2019-11-30 (×23): 5 [IU] via SUBCUTANEOUS

## 2019-11-26 MED ORDER — QUETIAPINE FUMARATE 50 MG PO TABS
100.0000 mg | ORAL_TABLET | Freq: Two times a day (BID) | ORAL | Status: DC
  Administered 2019-11-26 – 2019-11-27 (×3): 100 mg
  Filled 2019-11-26 (×3): qty 2

## 2019-11-26 MED ORDER — POTASSIUM CHLORIDE 20 MEQ/15ML (10%) PO SOLN
40.0000 meq | Freq: Three times a day (TID) | ORAL | Status: AC
  Administered 2019-11-26 (×2): 40 meq
  Filled 2019-11-26 (×2): qty 30

## 2019-11-26 MED ORDER — ACETAMINOPHEN 160 MG/5ML PO SOLN
650.0000 mg | Freq: Once | ORAL | Status: AC
  Administered 2019-11-26: 650 mg
  Filled 2019-11-26: qty 20.3

## 2019-11-26 MED ORDER — DEXTROSE 50 % IV SOLN: 12.5000 g | INTRAVENOUS | Status: AC

## 2019-11-26 NOTE — Progress Notes (Signed)
STROKE TEAM PROGRESS NOTE   INTERVAL HISTORY RN at bedside. Pt still intubated and tachypnic on vent. CCM plans for extubation trial but not sure if pt can tolerate. There is also plan for re-intubation and trach if failed extubation. Pt still not following commands but able to open eyes on voice and moving all extremities on pain. Had overnight bloody stool with dropping Hb, ASA on hold.   Vitals:   11/26/19 1200 11/26/19 1232 11/26/19 1300 11/26/19 1400  BP: 139/61 134/69 121/68 (!) 119/48  Pulse: 85 87 84 75  Resp: (!) 24 (!) 29 (!) 35 (!) 35  Temp:      TempSrc:      SpO2: 91% 90% 92% 93%  Weight:      Height:        CBC:  Recent Labs  Lab 11/24/19 0403 11/24/19 0403 11/25/19 0500 11/26/19 0401  WBC 14.8*   < > 16.4* 13.5*  NEUTROABS 10.2*  --   --   --   HGB 9.7*   < > 9.5* 8.3*  HCT 30.5*   < > 29.7* 26.6*  MCV 85.7   < > 85.1 87.5  PLT 300   < > 371 344   < > = values in this interval not displayed.    Basic Metabolic Panel:  Recent Labs  Lab 11/24/19 0403 11/25/19 0500 11/25/19 2114 11/26/19 0523  NA 134*   < > 146* 145  K 3.8   < > 2.5* 3.1*  CL 89*   < > 102 99  CO2 35*   < > 33* 35*  GLUCOSE 292*   < > 123* 102*  BUN 44*   < > 36* 36*  CREATININE 2.13*   < > 1.59* 1.72*  CALCIUM 7.7*   < > 6.9* 7.7*  MG 2.2  --   --  2.0   < > = values in this interval not displayed.   Lipid Panel:     Component Value Date/Time   CHOL 99 11/24/2019 1240   TRIG 166 (H) 11/24/2019 1240   HDL 14 (L) 11/24/2019 1240   CHOLHDL 7.1 11/24/2019 1240   VLDL 33 11/24/2019 1240   LDLCALC 52 11/24/2019 1240   LDLCALC 90 06/29/2019 1341   HgbA1c:  Lab Results  Component Value Date   HGBA1C 12.3 (H) 11/11/2019   Urine Drug Screen:     Component Value Date/Time   LABOPIA NONE DETECTED 11/11/2019 1500   COCAINSCRNUR NONE DETECTED 11/11/2019 1500   LABBENZ NONE DETECTED 11/11/2019 1500   AMPHETMU NONE DETECTED 11/11/2019 1500   THCU POSITIVE (A) 11/11/2019 1500    LABBARB NONE DETECTED 11/11/2019 1500    Alcohol Level     Component Value Date/Time   ETH <10 11/11/2019 1453    IMAGING past 24 hours No results found.  PHYSICAL EXAM   Temp:  [98.2 F (36.8 C)-101.4 F (38.6 C)] 101.4 F (38.6 C) (03/25 1103) Pulse Rate:  [51-113] 75 (03/25 1400) Resp:  [16-41] 35 (03/25 1400) BP: (83-164)/(35-80) 119/48 (03/25 1400) SpO2:  [90 %-100 %] 93 % (03/25 1400) FiO2 (%):  [30 %-40 %] 30 % (03/25 1232) Weight:  [103.2 kg] 103.2 kg (03/24 2213)  General - Well nourished, well developed, intubated on precedex.  Ophthalmologic - fundi not visualized due to noncooperation.  Cardiovascular - Regular rate and rhythm.  Neuro - intubated on precedex, eyes open with voice but need repetitive stimulation to keep eye opening, not following commands. Eyes able  to attend both sides but more left gaze preference, did not track objects today, not consistently blinking to visual threat bilaterally, PERRL. Corneal reflex present, gag and cough present. Breathing over the vent.  Facial symmetry not able to test due to ET tube.  Tongue protrusion not cooperative. On pain stimulation, moving BUE briefly against gravity, seems symmetric. RLE 2+/5 withdraw, LLE 2/5 withdraw. DTR 1+ and no babinski. Sensation, coordination and gait not tested.   ASSESSMENT/PLAN Susan [name] is a 59 y.o. female with history of HIV, IV drug abuse schizophrenia, seizure disorder admitted on 3/10 with ? overdose complicated by aspiration pneumonia with MRSA, pneumococcal bacteremia resulting in septic shock intubated for airway protection noted to have left-sided weakness once weaned off sedation.  Imaging shows B large MCA infarcts.   Stroke: moderate large B posterior MCA territory infarcts, etiology not quite clear. Several potential causes including recurrent hypotension with septic shock, cardiomyopathy, severe infection, uncontrolled risk factors including HIV and DM   CT head  3/10 normal  CT head 3/23 B large MCA territory infarcts w/ mild petechial hemorrhage   MRI  B posterior MCA territory infarcts  MRA  Unremarkable   MRV negative   2D Echo 3/11 EF 20-25%, small mobile density R coronary cusp  TEE Echo 3/16 EF 55-60%, no LAA thrombus, prominent node of Arancius on L coronary cusp. No vegetation/endocarditis. No PFO on color flow but no bubble study done  Carotid Doppler  B ICA 1-39% stenosis, VAs antegrade   LE Doppler no DVT  Hypercoagulable labs neg  LDL 52   HgbA1c 12.3  UDS positive for THC  Heparin 5000 units sq tid for VTE prophylaxis  No antithrombotic prior to admission, now aspirin 81 mg daily on hold due to concern of GIB and dropping Hb. No DAPT for now given possible procedures. Resume ASA as per primary team once appropriate.  Therapy recommendations:  Will need PT, OT and SLP for cognition and swallowing once off vent  Disposition:  pending   Cardiomyopathy, resolved  2D Echo 3/11 EF 20-25%  TEE Echo 3/16 EF 55-60%, no LAA thrombus. No PFO on color flow but no bubble study done  Acute Hypoxic/Hercapnic respiratory failure with ARDS from MRSA PNA and aspiration PNA  Prolonged intubation  On precedex and back on vent  Failed weaning  CCM onboard   Plan for extubation trial, if failed -> re-intubation with trach  MRSA PNA and aspiration PNA, Pneumococcal bacteremia Septic shock fever  Completed abx course for MRSA, HCAP  BCx 3/10 + strep pneumo  TEE no endocarditis  ID signed off  Continue PCA through 3/23  Fever 101.4 -> blood culture collected  Recurrent hypotension - SBP 70s to 80s intermittently throughout the hospitalization  HIV  On Descovy and Tivicay  CD4 = 471  Continue HARRT therapy  Hyperlipidemia  Home meds:  crestor 20, resumed in hospital  LDL 52, goal < 70  Elevated AST = 42->46  Continue statin at discharge  Diabetes type II Uncontrolled  HgbA1c 12.3, goal <  7.0  CBGs  SSI  Hyperglycemia improving  hypolglycemic readings 3/24  On lantus  Close PCP follow up after discharge  AKI   in setting of septic shock and hypoxia  Cre 2.50->2.48->2.13->2.01->1.72  CCM on board  Dysphagia . Secondary to stroke . NPO . On TF    Other Stroke Risk Factors  Cigarette smoker  Hx IV drug abuse (heroin), current daily use of marijuana - UDS:  THC POSITIVE, Off Heroin per chart since 12/2008. Seizure d/t heroin withdrawal in 2010; on Keppra through 01/2011  Morbid Obesity, Body mass index is 40.3 kg/m., recommend weight loss, diet and exercise as appropriate   Hx TIA w/o residual. No other details available.   Migraines   Acute systolic HF w sepsis induced cardiomyopathy   Other Active Problems  Schizophrenia - on buspar, zoloft, doxepin  Seizure d/t heroin withdrawal in 2010; on Keppra through 01/2011  Constipation   Elevated VB12 1476   Hospital day # 15  This patient is critically ill due to b/l stroke, septic shock, cardiomyopathy, AKI, HIV, respiratory failure and at significant risk of neurological worsening, death form recurrent stroke, hemorrhagic conversion, renal failure, heart failure, seizure. This patient's care requires constant monitoring of vital signs, hemodynamics, respiratory and cardiac monitoring, review of multiple databases, neurological assessment, discussion with family, other specialists and medical decision making of high complexity. I spent 30 minutes of neurocritical care time in the care of this patient.  Neurology will follow peripherally. Please call with questions. Thanks for the consult.   Rosalin Hawking, MD PhD Stroke Neurology 11/26/2019 2:55 PM    To contact Stroke Continuity provider, please refer to [email or web address] After hours, contact General Neurology

## 2019-11-26 NOTE — Progress Notes (Signed)
Hypoglycemic Event  CBG: 67  Treatment: D50 25 mL (12.5 gm)  Symptoms: None  Follow-up CBG: Time:0003 CBG Result:115  Possible Reasons for Event: Unknown  Comments/MD notified:Per protocol     [facility]

## 2019-11-26 NOTE — Progress Notes (Signed)
145mL of Fentanyl wasted with Anderson Malta, RN into stericycle.

## 2019-11-26 NOTE — Progress Notes (Signed)
NAME:  Susan [name], MRN:  [id number], DOB:  1960/10/09, LOS: 54 ADMISSION DATE:  11/11/2019, CONSULTATION DATE:  3/10 REFERRING MD:  Sabra Heck, CHIEF COMPLAINT:  Acute encephalopathy and respiratory failure    Brief History   59 year old female smoker admitted 3/10 with overdose complicated by aspiration PNA with MRSA,  Pneumococcal bacteremia, toxic metabolic encephalopathy and septic shock.    Past Medical History  Frequent falls at home,Tobacco abuse, diabetes with painful polyneuropathy, bipolar disease, GERD, HIV, schizophrenia, history of IV drug abuse, seizure disorder.   Significant Hospital Events   3/10 Admit with overdose, intubated on arrival, in shock   3/15 Ventilator asynchrony, heavy sedation & intermittent paralysis   3/16 Lasix drip.  Awaiting TEE.  Sputum w/ MRSA.   Coming back down on sedation, changed to [facility]. TEE showing NO EVIDENCE of endocarditis. ABX changed to pen G per ID. MRSA therapy complete. EF improved. Now up to 50% from 20-25% 3/17 Heavily sedated. Having episodes of bradycardia. Still massively overloaded (>14L), increasing lasix gtt to 8 mg from 4, adding metolazone.  3/19 Lasix to 40 every 12, changing RASS goal to -2, decrease in fentanyl, decreasing PEEP, anticipating trialing pressure support ventilation.  Watching upward trend of white blood cell count and low-grade fever 3/20 BM early am. Agitation early am > sedate post am PT psy meds 3/22 Decreased mentation and movement of extremities overnight, sent for Head CT which revealed acute vs subacute bilateral MCA infarcts  3/23 Lying in bed seen spontaneously moving lower and right upper extremity, unable to follow any commands.  3/24 Hypoglycemic episodes overnight, Weaning well 10/5 today, tmax 101.5 Remains on precedex gtt 1.2, off fentanyl, ongoing intermittent agitation/ tachypnea better after disimpacted.   Consults:  ID Cards for TEE Neurology   Procedures:  ETT 3/10 >>  R Fem TLC (ER) 3/10   R IJ TLC 3/10  L IJ TLC 3/10 >> 3/17  RUE PICC 3/16 >>   Significant Diagnostic Tests:   CT brain 3/10 >> Normal head CT  EEG 3/10 >> Moderate to severe diffuse encephalopathy. No seizures  Urine drug screen 3/10 >> positive for THC, acetaminophen Q000111Q, salicylate <7, ETOH Q000111Q  ECHO 3/11 >> EF 20-25%, LV function severely diminished, Global Hypokinesis,LV hypertrophy, Grade 1 Diastolic Dysfunction, Elevated PASP, Dilated LA, MV regurgitation, small mobile density right coronary cusp  TEE 3/16 >> LVEF 55%, No LA/LAA thrombus or masses, Previously noted mass on the aortic valve is a node of Arancius.  Normal variant and no evidence of endocarditis.Trivial TR and trivial PR 3/23 MRI brain >>Acute nonhemorrhagic infarcts in the posterior MCA territory bilaterally. Otherwise no evidence of chronic ischemia. 3/23 MRA head >>No significant intracranial stenosis.  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 3/23 MRV head >> negative  Micro Data:  Influenza A/B 3/10 >> negative  COVID 3/10 >> negative  MRSA PCR 3/10 >> positive  BCx2 3/10 >> streptococcus pneumoniae >> S-rocephin BCx1 (aerobic only) 3/10 >> negative  Tracheal aspirate 3/11 >> MRSA BCx2 3/12 >> negative   Antimicrobials:  Vancomycin 3/10 >> 3/16 Unasyn 3/10 >> 3/16 Pen G 3/16 >>   Interim history/subjective:  One episode of hypoglycemia overnight Hgb 9.5->8.3- rn reports some clotted blood around stools overnight, not mixed in, possibly related to disimpaction yesterday tmax 100 Remains on higher dose of precedex for intermittent agitation episodes Still not following commands Weaning well this morning, so far on 5/5  Objective   Blood pressure 139/65, pulse 81,  temperature 99.9 F (37.7 C), temperature source Axillary, resp. rate 19, height 5\' 3"  (1.6 m), weight 103.2 kg, SpO2 96 %.    Vent Mode: PSV;CPAP FiO2 (%):  [30 %-40 %] 30 % Set Rate:  [22 bmp] 22 bmp Vt Set:  [420 mL] 420  mL PEEP:  [5 cmH20] 5 cmH20 Pressure Support:  [10 cmH20] 10 cmH20 Plateau Pressure:  [21 cmH20-27 cmH20] 27 cmH20   Intake/Output Summary (Last 24 hours) at 11/26/2019 0847 Last data filed at 11/26/2019 0800 Gross per 24 hour  Intake 3208.11 ml  Output 1400 ml  Net 1808.11 ml   Filed Weights   11/24/19 0500 11/25/19 0500 11/25/19 2213  Weight: 105.9 kg 105.9 kg 103.2 kg   Examination: General:  Older female agitated on MV  HEENT: MM pink/moist, ETT at 20, OGT, pupils equal/reactive Neuro: Awake, makes eye contact/ tracks and sucking sounds around ETT, mildly agitated tapping her toes and grabbing side rails, not following commands.  Localizes b/, weaker in LUE/ LLE, withdraws in LE  CV: rr, no murmur PULM:  Currently on PSV 5/5 with adequate volumes, tachypnea ~28-30, lung sounds clear, no copious oral/ ETT secretions GI: obese, soft, bs+, purwick, NT Extremities: warm/dry, generalized +1 edema, RUE PICC wnl  Skin: no rashes   Resolved Hospital Problem list   Rhabdomyolysis  Elevated INR Septic shock   Assessment & Plan:   Acute hypoxic/hypercapnic respiratory failure with ARDS from MRSA PNA and aspiration pneumonia. Hx of asthma. Plan: Full MV support w/daily SBT trials.  Titrationing precedex, hopeful that her agitation will not be a barrier for trial of extubation before we have further discussions with family concerning a tracheostomy.  She has great TVs.  Given her large b/l MCA strokes, airway protection remains a concern.   VAP bundle Intermittent CXR/ ABG as needed Continue duonebs and prn albuterol    Acute bilateral large bilateral posterior MCA infarcts - 3/23  -TEE 3/16 with no sign of endocarditis  - MRI brain c/w b/l posterior MCA territory infarcts; MRA/ MRV neg Plan: Neurology following, appreciate assistance  Ongoing neuro exams  Hold ASA PT/ OT when able   MRSA and aspiration pneumonia, and Pneumococcal bacteremia -Completed ABx for MRSA HCAP  3/16 -ID s/o as of 3/20 Plan: Completed Penicillin on 3/23 Trend WBC/ fever curve   Hx of HIV Plan: Continue home Descovy and Tivicay   Acute systolic heart failure with sepsis induced CM. Hx of HTN, HLD. Plan: Holding home beta blocker, clonidine, norvasc, vasotec, and maxide on hold Continue crestor   Acute metabolic encephalopathy 2nd to sepsis, hypoxia and delirium  Hx of schizophrenia, seizures. Plan: RASS goal 0 to -1 continue higher dose precedex with fentanyl gtt to PRN  Not been able to wean precedex at all   Continue Cogentin, Buspar, Doxepin, Neurotin, and Zoloft  Delirium precautions  Acute Kidney Injury 2nd to ATN in setting of septic shock and hypoxia. Hypokalemia Volume overload - baseline creatinine 0.78 from 06/29/19 - remains net +11L, wts fluctuate  Plan: S/p KCL x 2 overnight now up to 3.1, additional KCL 40 meq q8 x 2 Mag ok at 2 Consider restarting diuresis when K is improved  Strict I/Os/ trend renal function  Renal panel/ mag in am   Constipation. Plan: Continue bowel regiment - scheduled miralax, senokot, prn dulcolax  DM type II poorly controlled with hyperglycemia. - HbA1C 12.3 from 11/11/19 Plan: Will decrease TF coverage from 10-> 5 units q 4 Unclear why hypoglycemic overnight  Resistant SSI  continue lantus 12 units BID    GOC  Ongoing.  Tracheostomy discussions started 3/23 with family.  I believe we need to give her a trial of extubation if possible before proceeding with trach.    Best practice:  Diet: tube feeds DVT prophylaxis: SQ heparin GI prophylaxis: protonix Mobility: BR Code Status: full code  Family Communication: Daughter Aquinna Blackwelder updated by phone 3/25 Disposition: to ICU   Labs:   CMP Latest Ref Rng & Units 11/26/2019 11/25/2019 11/25/2019  Glucose 70 - 99 mg/dL 102(H) 123(H) 123(H)  BUN 6 - 20 mg/dL 36(H) 36(H) 44(H)  Creatinine 0.44 - 1.00 mg/dL 1.72(H) 1.59(H) 2.01(H)  Sodium 135 - 145 mmol/L 145 146(H)  141  Potassium 3.5 - 5.1 mmol/L 3.1(L) 2.5(LL) 2.8(L)  Chloride 98 - 111 mmol/L 99 102 93(L)  CO2 22 - 32 mmol/L 35(H) 33(H) 37(H)  Calcium 8.9 - 10.3 mg/dL 7.7(L) 6.9(L) 8.2(L)  Total Protein 6.5 - 8.1 g/dL - - 7.0  Total Bilirubin 0.3 - 1.2 mg/dL - - 0.6  Alkaline Phos 38 - 126 U/L - - 288(H)  AST 15 - 41 U/L - - 46(H)  ALT 0 - 44 U/L - - 35    CBC Latest Ref Rng & Units 11/26/2019 11/25/2019 11/24/2019  WBC 4.0 - 10.5 K/uL 13.5(H) 16.4(H) 14.8(H)  Hemoglobin 12.0 - 15.0 g/dL 8.3(L) 9.5(L) 9.7(L)  Hematocrit 36.0 - 46.0 % 26.6(L) 29.7(L) 30.5(L)  Platelets 150 - 400 K/uL 344 371 300    ABG    Component Value Date/Time   PHART 7.390 11/22/2019 2027   PCO2ART 70.6 (HH) 11/22/2019 2027   PO2ART 50.0 (L) 11/22/2019 2027   HCO3 42.6 (H) 11/22/2019 2027   TCO2 45 (H) 11/22/2019 2027   ACIDBASEDEF 4.0 (H) 11/12/2019 1504   O2SAT 81.0 11/22/2019 2027    CBG (last 3)  Recent Labs    11/25/19 2357 11/26/19 0339 11/26/19 0810  GLUCAP 115* 104* 183*    CRITICAL CARE Performed by: Kennieth Rad   Total critical care time:  40 minutes   Critical care time was exclusive of separately billable procedures and treating other patients.   Critical care was necessary to treat or prevent imminent or life-threatening deterioration.   Critical care was time spent personally by me on the following activities: development of treatment plan with patient and/or surrogate as well as nursing, discussions with consultants, evaluation of patient's response to treatment, examination of patient, obtaining history from patient or surrogate, ordering and performing treatments and interventions, ordering and review of laboratory studies, ordering and review of radiographic studies, pulse oximetry and re-evaluation of patient's condition.  Kennieth Rad, MSN, AGACNP-BC [facility] 11/26/2019, 8:47 AM

## 2019-11-27 DIAGNOSIS — J9601 Acute respiratory failure with hypoxia: Secondary | ICD-10-CM | POA: Diagnosis not present

## 2019-11-27 DIAGNOSIS — I63413 Cerebral infarction due to embolism of bilateral middle cerebral arteries: Secondary | ICD-10-CM | POA: Diagnosis not present

## 2019-11-27 LAB — GLUCOSE, CAPILLARY
Glucose-Capillary: 112 mg/dL — ABNORMAL HIGH (ref 70–99)
Glucose-Capillary: 126 mg/dL — ABNORMAL HIGH (ref 70–99)
Glucose-Capillary: 126 mg/dL — ABNORMAL HIGH (ref 70–99)
Glucose-Capillary: 134 mg/dL — ABNORMAL HIGH (ref 70–99)
Glucose-Capillary: 175 mg/dL — ABNORMAL HIGH (ref 70–99)
Glucose-Capillary: 228 mg/dL — ABNORMAL HIGH (ref 70–99)

## 2019-11-27 LAB — BASIC METABOLIC PANEL
Anion gap: 11 (ref 5–15)
BUN: 39 mg/dL — ABNORMAL HIGH (ref 6–20)
CO2: 34 mmol/L — ABNORMAL HIGH (ref 22–32)
Calcium: 7.9 mg/dL — ABNORMAL LOW (ref 8.9–10.3)
Chloride: 104 mmol/L (ref 98–111)
Creatinine, Ser: 1.64 mg/dL — ABNORMAL HIGH (ref 0.44–1.00)
GFR calc Af Amer: 40 mL/min — ABNORMAL LOW (ref >60)
GFR calc non Af Amer: 34 mL/min — ABNORMAL LOW (ref >60)
Glucose, Bld: 144 mg/dL — ABNORMAL HIGH (ref 70–99)
Potassium: 3.2 mmol/L — ABNORMAL LOW (ref 3.5–5.1)
Sodium: 149 mmol/L — ABNORMAL HIGH (ref 135–145)

## 2019-11-27 LAB — CBC
HCT: 28.3 % — ABNORMAL LOW (ref 36.0–46.0)
Hemoglobin: 8.7 g/dL — ABNORMAL LOW (ref 12.0–15.0)
MCH: 27.4 pg (ref 26.0–34.0)
MCHC: 30.7 g/dL (ref 30.0–36.0)
MCV: 89.3 fL (ref 80.0–100.0)
Platelets: 492 10*3/uL — ABNORMAL HIGH (ref 150–400)
RBC: 3.17 MIL/uL — ABNORMAL LOW (ref 3.87–5.11)
RDW: 15.9 % — ABNORMAL HIGH (ref 11.5–15.5)
WBC: 15.1 10*3/uL — ABNORMAL HIGH (ref 4.0–10.5)
nRBC: 0.1 % (ref 0.0–0.2)

## 2019-11-27 MED ORDER — MAGNESIUM SULFATE 2 GM/50ML IV SOLN: 2.0000 g | Freq: Once | INTRAVENOUS | Status: AC

## 2019-11-27 MED ORDER — QUETIAPINE FUMARATE 100 MG PO TABS
200.0000 mg | ORAL_TABLET | Freq: Two times a day (BID) | ORAL | Status: DC
  Administered 2019-11-27 – 2019-12-16 (×38): 200 mg
  Filled 2019-11-27 (×2): qty 2
  Filled 2019-11-27 (×2): qty 4
  Filled 2019-11-27: qty 2
  Filled 2019-11-27: qty 4
  Filled 2019-11-27: qty 2
  Filled 2019-11-27: qty 4
  Filled 2019-11-27 (×5): qty 2
  Filled 2019-11-27 (×3): qty 4
  Filled 2019-11-27 (×2): qty 2
  Filled 2019-11-27 (×3): qty 4
  Filled 2019-11-27 (×3): qty 2
  Filled 2019-11-27 (×2): qty 4
  Filled 2019-11-27 (×4): qty 2
  Filled 2019-11-27: qty 4
  Filled 2019-11-27 (×2): qty 2
  Filled 2019-11-27: qty 4
  Filled 2019-11-27: qty 2
  Filled 2019-11-27: qty 4
  Filled 2019-11-27: qty 2
  Filled 2019-11-27 (×2): qty 4

## 2019-11-27 MED ORDER — IPRATROPIUM-ALBUTEROL 0.5-2.5 (3) MG/3ML IN SOLN
3.0000 mL | Freq: Two times a day (BID) | RESPIRATORY_TRACT | Status: DC
  Administered 2019-11-28 – 2019-11-29 (×3): 3 mL via RESPIRATORY_TRACT
  Filled 2019-11-27 (×3): qty 3

## 2019-11-27 MED ORDER — POTASSIUM CHLORIDE 20 MEQ/15ML (10%) PO SOLN
ORAL | Status: AC
  Filled 2019-11-27: qty 30

## 2019-11-27 MED ORDER — FUROSEMIDE 10 MG/ML IJ SOLN
40.0000 mg | Freq: Once | INTRAMUSCULAR | Status: AC
  Administered 2019-11-27: 40 mg via INTRAVENOUS
  Filled 2019-11-27: qty 4

## 2019-11-27 MED ORDER — SODIUM CHLORIDE 0.9 % IV SOLN
2.0000 g | Freq: Two times a day (BID) | INTRAVENOUS | Status: DC
  Administered 2019-11-27 – 2019-12-02 (×12): 2 g via INTRAVENOUS
  Filled 2019-11-27 (×13): qty 2

## 2019-11-27 MED ORDER — POTASSIUM CHLORIDE 20 MEQ/15ML (10%) PO SOLN
40.0000 meq | Freq: Three times a day (TID) | ORAL | Status: AC
  Administered 2019-11-27 – 2019-11-28 (×3): 40 meq
  Filled 2019-11-27 (×2): qty 30

## 2019-11-27 MED ORDER — QUETIAPINE FUMARATE 50 MG PO TABS
100.0000 mg | ORAL_TABLET | ORAL | Status: AC
  Administered 2019-11-27: 12:00:00 100 mg
  Filled 2019-11-27: qty 2

## 2019-11-27 MED ORDER — MAGNESIUM SULFATE 2 GM/50ML IV SOLN
INTRAVENOUS | Status: AC
  Administered 2019-11-27: 2 g via INTRAVENOUS
  Filled 2019-11-27: qty 50

## 2019-11-27 MED ORDER — CLONIDINE HCL 0.3 MG PO TABS
0.3000 mg | ORAL_TABLET | Freq: Four times a day (QID) | ORAL | Status: DC
  Administered 2019-11-27 – 2019-11-30 (×10): 0.3 mg
  Filled 2019-11-27 (×10): qty 1

## 2019-11-27 NOTE — Progress Notes (Addendum)
NAME:  Susan [name], MRN:  [id number], DOB:  05-19-1961, LOS: 48 ADMISSION DATE:  11/11/2019, CONSULTATION DATE:  3/10 REFERRING MD:  Sabra Heck, CHIEF COMPLAINT:  Acute encephalopathy and respiratory failure    Brief History   59 year old female smoker admitted 3/10 with overdose complicated by aspiration PNA with MRSA,  Pneumococcal bacteremia, toxic metabolic encephalopathy, septic shock, and bilateral posterior MCA strokes.   Past Medical History  Frequent falls at home,Tobacco abuse, diabetes with painful polyneuropathy, bipolar disease, GERD, HIV, schizophrenia, history of IV drug abuse, seizure disorder.   Significant Hospital Events   3/10 Admit with overdose, intubated on arrival, in shock   3/15 Ventilator asynchrony, heavy sedation & intermittent paralysis   3/16 Lasix drip.  Awaiting TEE.  Sputum w/ MRSA.   Coming back down on sedation, changed to [facility]. TEE showing NO EVIDENCE of endocarditis. ABX changed to pen G per ID. MRSA therapy complete. EF improved. Now up to 50% from 20-25% 3/17 Heavily sedated. Having episodes of bradycardia. Still massively overloaded (>14L), increasing lasix gtt to 8 mg from 4, adding metolazone.  3/19 Lasix to 40 every 12, changing RASS goal to -2, decrease in fentanyl, decreasing PEEP, anticipating trialing pressure support ventilation.  Watching upward trend of white blood cell count and low-grade fever 3/20 BM early am. Agitation early am > sedate post am PT psy meds 3/22 Decreased mentation and movement of extremities overnight, sent for Head CT which revealed acute vs subacute bilateral MCA infarcts  3/23 Lying in bed seen spontaneously moving lower and right upper extremity, unable to follow any commands.  3/24 Hypoglycemic episodes overnight, Weaning well 10/5 today, tmax 101.5 Remains on precedex gtt 1.2, off fentanyl, ongoing intermittent agitation/ tachypnea better after disimpacted.  3/25 One episode of hypoglycemia overnight, Hgb 9.5->8.3- rn  reports some clotted blood around stools overnight, not mixed in, possibly related to disimpaction yesterday, tmax 100 Remains on higher dose of precedex for intermittent agitation episodes, Still not following commands, Weaning well this morning, so far on 5/5.   Consults:  ID Cards for TEE Neurology   Procedures:  ETT 3/10 >>  R Fem TLC (ER) 3/10  R IJ TLC 3/10  L IJ TLC 3/10 >> 3/17  RUE PICC 3/16 >>   Significant Diagnostic Tests:   CT brain 3/10 >> Normal head CT  EEG 3/10 >> Moderate to severe diffuse encephalopathy. No seizures  Urine drug screen 3/10 >> positive for THC, acetaminophen Q000111Q, salicylate <7, ETOH Q000111Q  ECHO 3/11 >> EF 20-25%, LV function severely diminished, Global Hypokinesis,LV hypertrophy, Grade 1 Diastolic Dysfunction, Elevated PASP, Dilated LA, MV regurgitation, small mobile density right coronary cusp  TEE 3/16 >> LVEF 55%, No LA/LAA thrombus or masses, Previously noted mass on the aortic valve is a node of Arancius.  Normal variant and no evidence of endocarditis.Trivial TR and trivial PR 3/23 MRI brain >>Acute nonhemorrhagic infarcts in the posterior MCA territory bilaterally. Otherwise no evidence of chronic ischemia. 3/23 MRA head >>No significant intracranial stenosis.  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 3/23 MRV head >> negative  Micro Data:  Influenza A/B 3/10 >> negative  COVID 3/10 >> negative  MRSA PCR 3/10 >> positive  BCx2 3/10 >> streptococcus pneumoniae >> S-rocephin BCx1 (aerobic only) 3/10 >> negative  Tracheal aspirate 3/11 >> MRSA BCx2 3/12 >> negative  3/25 BCx 2 >> aerococcus >> 3/25 trach asp >> few GNR >>  Antimicrobials:  Vancomycin 3/10 >> 3/16 Unasyn 3/10 >> 3/16  Pen G 3/16 >> 3/23 Cefepime 3/26 >>  Interim history/subjective:  Was about to extubate yesterday and desaturated and rr increased into the 40's.  Weaning well today again.  No difference in restlessness/ agitation weaning vs  full support.  Requiring up to precedex 2.2 overnight  No further bleeding noted with stoolings  Objective   Blood pressure (!) 159/67, pulse 79, temperature 98.6 F (37 C), resp. rate (!) 22, height 5\' 3"  (1.6 m), weight 104.2 kg, SpO2 99 %.    Vent Mode: PRVC FiO2 (%):  [30 %] 30 % Set Rate:  [22 bmp] 22 bmp Vt Set:  [420 mL] 420 mL PEEP:  [5 cmH20] 5 cmH20 Plateau Pressure:  [21 cmH20-23 cmH20] 21 cmH20   Intake/Output Summary (Last 24 hours) at 11/27/2019 1053 Last data filed at 11/27/2019 0900 Gross per 24 hour  Intake 3381.36 ml  Output 1950 ml  Net 1431.36 ml   Filed Weights   11/25/19 0500 11/25/19 2213 11/27/19 0500  Weight: 105.9 kg 103.2 kg 104.2 kg   Examination: General:  Older adult female lying in bed in no distress, restless at times HEENT: MM pink/moist, ETT, OGT Neuro: Alert, not f/c, seems to mimic more than anything c/w with receptive aphasia, moves MAE, weaker on LUE/LLL CV: rr, no murmur PULM:  Non labored- tachypneic in the 20/30's irregardless of PRVC vs PSV, coarse throughout GI: obese, soft, bs active, foley  Extremities: warm/dry, trace LE edema  Skin: no rashes  Resolved Hospital Problem list   Rhabdomyolysis  Elevated INR Septic shock   Assessment & Plan:   Acute hypoxic/hypercapnic respiratory failure with ARDS from MRSA PNA and aspiration pneumonia. Hx of asthma. Plan: Continue full support/ daily SBT- she has great mechanics however her ability to protect her airway remains a concern.  We were about to extubate her yesterday however she started desaturating and became tachypneic in the 40's without explanation.  Unfortunately, she has had multiple hospital complications which have hindered [place] in liberating her from the ventilator.   I think if we extubated her, she would fail even events of yesterday and require re-intubation.  I have set up a family meeting today at 1500 to clarify goals and plan of care.  Thus far today, she is weaning  well.  We could attempt extubation but will speak with family on [place] prior to.   VAP bundle Intermittent CXR/ ABG as needed Continue duonebs and prn albuterol    Acute bilateral large bilateral posterior MCA infarcts - 3/23  -TEE 3/16 with no sign of endocarditis  - MRI brain c/w b/l posterior MCA territory infarcts; MRA/ MRV neg Plan: Neurology following, appreciate assistance  Ongoing neuro exams  Resume ASA- H/H stable, monitor closely  PT/ OT when able   MRSA and aspiration pneumonia, and Pneumococcal bacteremia -Completed ABx for MRSA HCAP 3/16 -ID s/o as of 3/20 - Completed Penicillin on 3/23 Plan: Given fevers and immunocompromised state, re-cultured 3/25, trach asp with GNR pending final and BCx2 showing aerococcus pending final.  Appreciate pharmacy assistance.  Starting cefepime and await final results   Hx of HIV Plan: Continue home Descovy and Tivicay   Acute systolic heart failure with sepsis induced CM- resolved Hx of HTN, HLD. - TEE 11/17/19 with EF back to 55-60 and normal RV Plan: Holding home beta blocker, norvasc, vasotec, and maxide on hold  Continue crestor   Acute metabolic encephalopathy 2nd to sepsis, hypoxia and delirium  Hx of schizophrenia, seizures. Plan: Requiring higher  doses of precedex for agitation above 2, despite adding Seroquel yesterday Will start clonidine taper to get precedex Increase seroquel dosing, QTc 0.38 Continue Cogentin, Buspar, Doxepin, Neurotin, and Zoloft  Delirium precautions  Acute Kidney Injury 2nd to ATN in setting of septic shock and hypoxia. Hypokalemia Volume overload- improved  - baseline creatinine 0.78 from 06/29/19 - net volume down to +8L Plan: KCL 40 meq q8 hr x 3 Mag 2 gm empirically  Lasix 40 meq x 1 Strict I/Os/ trend renal function  Renal panel/ mag in am   Constipation. Plan: Continue bowel regiment - scheduled miralax, senokot, prn dulcolax  DM type II poorly controlled with hyperglycemia. -  HbA1C 12.3 from 11/11/19 Plan: TF coverage 5 units q 4 No further hypoglycemic episodes Resistant SSI  continue lantus 12 units BID   Anemia P:  H/H stable - monitor  Trend CBC    GOC  Goals/ plan of care including tracheostomy discussions started 3/23 with family.  We are at a point where a decision has to be made to continue ongoing care which will include a tracheostomy as she has been intubated since 3/10 or change to comfort focused care. A family meeting has been set up for today at 3 pm to include daughter, sons, and patient's brother.     Best practice:  Diet: tube feeds DVT prophylaxis: SQ heparin GI prophylaxis: protonix Mobility: BR Code Status: full code  Family Communication: Daughter Aquinna Hyser updated by phone 3/26.  Meeting set up for today.  Disposition: to ICU   Labs:   CMP Latest Ref Rng & Units 11/27/2019 11/26/2019 11/25/2019  Glucose 70 - 99 mg/dL 144(H) 102(H) 123(H)  BUN 6 - 20 mg/dL 39(H) 36(H) 36(H)  Creatinine 0.44 - 1.00 mg/dL 1.64(H) 1.72(H) 1.59(H)  Sodium 135 - 145 mmol/L 149(H) 145 146(H)  Potassium 3.5 - 5.1 mmol/L 3.2(L) 3.1(L) 2.5(LL)  Chloride 98 - 111 mmol/L 104 99 102  CO2 22 - 32 mmol/L 34(H) 35(H) 33(H)  Calcium 8.9 - 10.3 mg/dL 7.9(L) 7.7(L) 6.9(L)  Total Protein 6.5 - 8.1 g/dL - - -  Total Bilirubin 0.3 - 1.2 mg/dL - - -  Alkaline Phos 38 - 126 U/L - - -  AST 15 - 41 U/L - - -  ALT 0 - 44 U/L - - -    CBC Latest Ref Rng & Units 11/27/2019 11/26/2019 11/26/2019  WBC 4.0 - 10.5 K/uL 15.1(H) - 13.5(H)  Hemoglobin 12.0 - 15.0 g/dL 8.7(L) 9.2(L) 8.3(L)  Hematocrit 36.0 - 46.0 % 28.3(L) 29.3(L) 26.6(L)  Platelets 150 - 400 K/uL 492(H) - 344    ABG    Component Value Date/Time   PHART 7.390 11/22/2019 2027   PCO2ART 70.6 (HH) 11/22/2019 2027   PO2ART 50.0 (L) 11/22/2019 2027   HCO3 42.6 (H) 11/22/2019 2027   TCO2 45 (H) 11/22/2019 2027   ACIDBASEDEF 4.0 (H) 11/12/2019 1504   O2SAT 81.0 11/22/2019 2027    CBG (last 3)    Recent Labs    11/26/19 2335 11/27/19 0338 11/27/19 0825  GLUCAP 161* 112* 175*    CRITICAL CARE Performed by: Kennieth Rad   Total critical care time: 50 minutes   Critical care time was exclusive of separately billable procedures and treating other patients.   Critical care was necessary to treat or prevent imminent or life-threatening deterioration.   Critical care was time spent personally by me on the following activities: development of treatment plan with patient and/or surrog+ate as well as nursing,  discussions with consultants, evaluation of patient's response to treatment, examination of patient, obtaining history from patient or surrogate, ordering and performing treatments and interventions, ordering and review of laboratory studies, ordering and review of radiographic studies, pulse oximetry and re-evaluation of patient's condition.  Kennieth Rad, MSN, AGACNP-BC  [facility] 11/27/2019, 10:53 AM

## 2019-11-27 NOTE — Progress Notes (Signed)
PCCM Interval Note  I was able to meet with the patient's family today in the ICU along with B Simpson, NP, and her RN Ryan.   Explained to them the patient's course, treated and current medical issues including her ventilator dependence, encephalopathy and aphasia due to bilateral MCA strokes.  We discussed prognosis, current plan of care.  Try to clarify some miscommunication between the care team and family regarding patient of intubation, tracheostomy, progress with spontaneous breathing trials, etc.  They voiced some frustration that it was not immediately obvious that she had experienced CVAs, which is understandable.  Explained to them that sedation masked some of the symptoms of brain injury that became evident that became evident when sedation was lightened.  I told them that Susan [name] has a poor prognosis for meaningful neurological recovery.  We discussed the patient's wishes, her philosophy for her care.  The family indicated that they believe the patient would want tracheostomy to extend her care including possible mechanical ventilation in order to see if there can be any significant neurological recovery.  Based on this I will arrange for tracheostomy when possible.  Patient is answered.  Independent CC time 40 minutes  Baltazar Apo, MD, PhD 11/27/2019, 4:19 PM [facility] [phone] or if no answer [phone]

## 2019-11-27 NOTE — Progress Notes (Signed)
Pharmacy Antibiotic Note  Susan [name] is a 59 y.o. female admitted on 11/11/2019 with AMS and respiratory failure.  Patient completed a course of antibiotic for bacteremia.  Now with GNR PNA and Aerococcus bacteremia.  Pharmacy has been consulted for cefepime dosing.  She is immunocompromised.  SCr down 1.64, CrCL 44 ml/min, Tmax 101.4, WBC up to 15.1.  Plan: Cefepime 2gm IV Q12H Monitor renal fxn, micro data  Height: 5\' 3"  (160 cm) Weight: 229 lb 11.5 oz (104.2 kg) IBW/kg (Calculated) : 52.4  Temp (24hrs), Avg:99.7 F (37.6 C), Min:98.6 F (37 C), Max:101.4 F (38.6 C)  Recent Labs  Lab 11/23/19 0500 11/23/19 0500 11/24/19 0403 11/25/19 0500 11/25/19 2114 11/26/19 0401 11/26/19 0523 11/27/19 0508  WBC 12.6*  --  14.8* 16.4*  --  13.5*  --  15.1*  CREATININE 2.48*   < > 2.13* 2.01* 1.59*  --  1.72* 1.64*   < > = values in this interval not displayed.    Estimated Creatinine Clearance: 43.7 mL/min (A) (by C-G formula based on SCr of 1.64 mg/dL (H)).    Allergies  Allergen Reactions  . Lyrica [Pregabalin] Swelling    Has LE swelling   Vanc 3/10 >>3/17 Unasyn 3/10 >> 3/11 Zosyn x1 3/10, 3/11 >>3/12 PCN 3/16 >> 3/23 Cefepime 3/26 >>   3/10 covid / flu - negative 3/10 MRSA PCR - positive 3/10BCx - 1/4 Strep pneumo  3/10UCx - negative 3/10 TA - MRSA 3/12 BCx - negative 3/25 TA - GNR 3/25 BCx - 2/2 Aerococcus spp  Deah Ottaway D. Mina Marble, PharmD, BCPS, [place] 11/27/2019, 10:55 AM

## 2019-11-28 DIAGNOSIS — B20 Human immunodeficiency virus [HIV] disease: Secondary | ICD-10-CM | POA: Diagnosis not present

## 2019-11-28 DIAGNOSIS — G9341 Metabolic encephalopathy: Secondary | ICD-10-CM | POA: Diagnosis not present

## 2019-11-28 DIAGNOSIS — J9601 Acute respiratory failure with hypoxia: Secondary | ICD-10-CM | POA: Diagnosis not present

## 2019-11-28 DIAGNOSIS — N170 Acute kidney failure with tubular necrosis: Secondary | ICD-10-CM | POA: Diagnosis not present

## 2019-11-28 LAB — GLUCOSE, CAPILLARY
Glucose-Capillary: 125 mg/dL — ABNORMAL HIGH (ref 70–99)
Glucose-Capillary: 169 mg/dL — ABNORMAL HIGH (ref 70–99)
Glucose-Capillary: 96 mg/dL (ref 70–99)
Glucose-Capillary: 191 mg/dL — ABNORMAL HIGH (ref 70–99)
Glucose-Capillary: 200 mg/dL — ABNORMAL HIGH (ref 70–99)

## 2019-11-28 LAB — CBC
HCT: 28.9 % — ABNORMAL LOW (ref 36.0–46.0)
Hemoglobin: 8.8 g/dL — ABNORMAL LOW (ref 12.0–15.0)
MCH: 27.2 pg (ref 26.0–34.0)
MCHC: 30.4 g/dL (ref 30.0–36.0)
MCV: 89.2 fL (ref 80.0–100.0)
Platelets: 556 K/uL — ABNORMAL HIGH (ref 150–400)
RBC: 3.24 MIL/uL — ABNORMAL LOW (ref 3.87–5.11)
RDW: 16.7 % — ABNORMAL HIGH (ref 11.5–15.5)
WBC: 12.8 K/uL — ABNORMAL HIGH (ref 4.0–10.5)
nRBC: 0.5 % — ABNORMAL HIGH (ref 0.0–0.2)

## 2019-11-28 LAB — CULTURE, RESPIRATORY W GRAM STAIN

## 2019-11-28 LAB — BASIC METABOLIC PANEL
Anion gap: 10 (ref 5–15)
BUN: 36 mg/dL — ABNORMAL HIGH (ref 6–20)
CO2: 32 mmol/L (ref 22–32)
Calcium: 8.1 mg/dL — ABNORMAL LOW (ref 8.9–10.3)
Chloride: 108 mmol/L (ref 98–111)
Creatinine, Ser: 1.67 mg/dL — ABNORMAL HIGH (ref 0.44–1.00)
GFR calc Af Amer: 39 mL/min — ABNORMAL LOW (ref >60)
GFR calc non Af Amer: 34 mL/min — ABNORMAL LOW (ref >60)
Glucose, Bld: 156 mg/dL — ABNORMAL HIGH (ref 70–99)
Potassium: 4 mmol/L (ref 3.5–5.1)
Sodium: 150 mmol/L — ABNORMAL HIGH (ref 135–145)

## 2019-11-28 LAB — HIV-1 RNA QUANT-NO REFLEX-BLD
HIV 1 RNA Quant: 20 {copies}/mL
LOG10 HIV-1 RNA: 1.301 {Log_copies}/mL

## 2019-11-28 LAB — MAGNESIUM: Magnesium: 2.5 mg/dL — ABNORMAL HIGH (ref 1.7–2.4)

## 2019-11-28 MED ORDER — FREE WATER
200.0000 mL | Status: DC
  Administered 2019-11-28 – 2019-12-20 (×123): 200 mL

## 2019-11-28 NOTE — Progress Notes (Signed)
NAME:  Susan [name], MRN:  [id number], DOB:  03/18/1961, LOS: 12 ADMISSION DATE:  11/11/2019, CONSULTATION DATE:  3/10 REFERRING MD:  Sabra Heck, CHIEF COMPLAINT:  Acute encephalopathy and respiratory failure    Brief History   59 year old female smoker admitted 3/10 with overdose complicated by aspiration PNA with MRSA,  Pneumococcal bacteremia, toxic metabolic encephalopathy and septic shock.    Past Medical History  Frequent falls at home,Tobacco abuse, diabetes with painful polyneuropathy, bipolar disease, GERD, HIV, schizophrenia, history of IV drug abuse, seizure disorder.   Significant Hospital Events   3/10 Admit with overdose, intubated on arrival, in shock   3/15 Ventilator asynchrony, heavy sedation & intermittent paralysis   3/16 Lasix drip.  Awaiting TEE.  Sputum w/ MRSA.   Coming back down on sedation, changed to [facility]. TEE showing NO EVIDENCE of endocarditis. ABX changed to pen G per ID. MRSA therapy complete. EF improved. Now up to 50% from 20-25% 3/17 Heavily sedated. Having episodes of bradycardia. Still massively overloaded (>14L), increasing lasix gtt to 8 mg from 4, adding metolazone.  3/19 Lasix to 40 every 12, changing RASS goal to -2, decrease in fentanyl, decreasing PEEP, anticipating trialing pressure support ventilation.  Watching upward trend of white blood cell count and low-grade fever 3/20 BM early am. Agitation early am > sedate post am PT psy meds 3/22 Decreased mentation and movement of extremities overnight, sent for Head CT which revealed acute vs subacute bilateral MCA infarcts  3/23 Lying in bed seen spontaneously moving lower and right upper extremity, unable to follow any commands.  3/24 Hypoglycemic episodes overnight, Weaning well 10/5 today, tmax 101.5 Remains on precedex gtt 1.2, off fentanyl, ongoing intermittent agitation/ tachypnea better after disimpacted.   Consults:  ID Cards for TEE Neurology   Procedures:  ETT 3/10 >>  R Fem TLC (ER) 3/10   R IJ TLC 3/10  L IJ TLC 3/10 >> 3/17  RUE PICC 3/16 >>   Significant Diagnostic Tests:   CT brain 3/10 >> Normal head CT  EEG 3/10 >> Moderate to severe diffuse encephalopathy. No seizures  Urine drug screen 3/10 >> positive for THC, acetaminophen Q000111Q, salicylate <7, ETOH Q000111Q  ECHO 3/11 >> EF 20-25%, LV function severely diminished, Global Hypokinesis,LV hypertrophy, Grade 1 Diastolic Dysfunction, Elevated PASP, Dilated LA, MV regurgitation, small mobile density right coronary cusp  TEE 3/16 >> LVEF 55%, No LA/LAA thrombus or masses, Previously noted mass on the aortic valve is a node of Arancius.  Normal variant and no evidence of endocarditis.Trivial TR and trivial PR 3/23 MRI brain >>Acute nonhemorrhagic infarcts in the posterior MCA territory bilaterally. Otherwise no evidence of chronic ischemia. 3/23 MRA head >>No significant intracranial stenosis.  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 3/23 MRV head >> negative  Micro Data:  Influenza A/B 3/10 >> negative  COVID 3/10 >> negative  MRSA PCR 3/10 >> positive  BCx2 3/10 >> streptococcus pneumoniae >> S-rocephin BCx1 (aerobic only) 3/10 >> negative  Tracheal aspirate 3/11 >> MRSA BCx2 3/12 >> negative   Antimicrobials:  Vancomycin 3/10 >> 3/16 Unasyn 3/10 >> 3/16 Pen G 3/16 >>   Interim history/subjective:  One episode of hypoglycemia overnight Hgb 9.5->8.3- rn reports some clotted blood around stools overnight, not mixed in, possibly related to disimpaction yesterday tmax 100 Remains on higher dose of precedex for intermittent agitation episodes Still not following commands Weaning well this morning, so far on 5/5  Objective   Blood pressure (!) 142/72, pulse  72, temperature 100.1 F (37.8 C), temperature source Axillary, resp. rate (!) 31, height 5\' 3"  (1.6 m), weight 104.6 kg, SpO2 97 %.    Vent Mode: CPAP;PSV FiO2 (%):  [30 %] 30 % Set Rate:  [22 bmp] 22 bmp Vt Set:  [420 mL] 420  mL PEEP:  [5 cmH20] 5 cmH20 Pressure Support:  [5 cmH20-15 cmH20] 15 cmH20 Plateau Pressure:  [21 cmH20-30 cmH20] 26 cmH20   Intake/Output Summary (Last 24 hours) at 11/28/2019 1202 Last data filed at 11/28/2019 1100 Gross per 24 hour  Intake 2870.39 ml  Output 3360 ml  Net -489.61 ml   Filed Weights   11/25/19 2213 11/27/19 0500 11/28/19 0443  Weight: 103.2 kg 104.2 kg 104.6 kg   Examination: General:  Older female agitated on MV  HEENT: MM pink/moist, ETT at 20, OGT, pupils equal/reactive Neuro: Awake, makes eye contact/ tracks and sucking sounds around ETT, mildly agitated tapping her toes and grabbing side rails, not following commands.  Localizes b/, weaker in LUE/ LLE, withdraws in LE  CV: rr, no murmur PULM:  Currently on PSV 5/5 with adequate volumes, tachypnea ~28-30, lung sounds clear, no copious oral/ ETT secretions GI: obese, soft, bs+, purwick, NT Extremities: warm/dry, generalized +1 edema, RUE PICC wnl  Skin: no rashes   Resolved Hospital Problem list   Rhabdomyolysis  Elevated INR Septic shock   Assessment & Plan:   Acute hypoxic/hypercapnic respiratory failure with ARDS from MRSA PNA and aspiration pneumonia. Hx of asthma. Plan: Full MV support w/daily PS trials.  Maintain precedex Plan is for tracheostomy - will discuss with family, may arrange for this Sunday 3/28.  Given her large b/l MCA strokes, airway protection remains a concern.   VAP bundle Intermittent CXR/ ABG as needed Continue duonebs and prn albuterol    Acute bilateral large bilateral posterior MCA infarcts - 3/23  -TEE 3/16 with no sign of endocarditis  - MRI brain c/w b/l posterior MCA territory infarcts; MRA/ MRV neg Plan: Neurology following, appreciate assistance  Ongoing neuro exams  Hold ASA PT/ OT when able   MRSA and aspiration pneumonia, and Pneumococcal bacteremia -Completed ABx for MRSA HCAP 3/16 -ID s/o as of 3/20 Plan: Completed Penicillin on 3/23 Trend WBC/ fever  curve   Hx of HIV Plan: Continue home Descovy and Tivicay   Acute systolic heart failure with sepsis induced CM. Hx of HTN, HLD. Plan: Holding home beta blocker, clonidine, norvasc, vasotec, and maxide on hold Continue crestor   Acute metabolic encephalopathy 2nd to sepsis, hypoxia and delirium  Hx of schizophrenia, seizures. Plan: RASS goal 0 to -1 continue higher dose precedex with fentanyl gtt to PRN  Not been able to wean precedex at all   Continue Cogentin, Buspar, Doxepin, Neurotin, and Zoloft  Delirium precautions  Acute Kidney Injury 2nd to ATN in setting of septic shock and hypoxia. Hypokalemia, Hypernatremia - baseline creatinine 0.78 from 06/29/19 Plan: Monitor K and Mag.  Strict I/Os/ trend renal function  Renal panel/ mag in am  May need additional diuresis.   Constipation. Plan: Continue bowel regiment - scheduled miralax, senokot, prn dulcolax  DM type II poorly controlled with hyperglycemia. - HbA1C 12.3 from 11/11/19 Plan: Will decrease TF coverage from 10-> 5 units q 4 Unclear why hypoglycemic overnight Resistant SSI  continue lantus 12 units BID    GOC  Plan for trach.   Best practice:  Diet: tube feeds DVT prophylaxis: SQ heparin GI prophylaxis: protonix Mobility: BR Code Status: full code  Family Communication: see family meeting documentation from 3/26. Will consent for trach today.  Disposition: to ICU   Labs:   CMP Latest Ref Rng & Units 11/28/2019 11/27/2019 11/26/2019  Glucose 70 - 99 mg/dL 156(H) 144(H) 102(H)  BUN 6 - 20 mg/dL 36(H) 39(H) 36(H)  Creatinine 0.44 - 1.00 mg/dL 1.67(H) 1.64(H) 1.72(H)  Sodium 135 - 145 mmol/L 150(H) 149(H) 145  Potassium 3.5 - 5.1 mmol/L 4.0 3.2(L) 3.1(L)  Chloride 98 - 111 mmol/L 108 104 99  CO2 22 - 32 mmol/L 32 34(H) 35(H)  Calcium 8.9 - 10.3 mg/dL 8.1(L) 7.9(L) 7.7(L)  Total Protein 6.5 - 8.1 g/dL - - -  Total Bilirubin 0.3 - 1.2 mg/dL - - -  Alkaline Phos 38 - 126 U/L - - -  AST 15 - 41 U/L  - - -  ALT 0 - 44 U/L - - -    CBC Latest Ref Rng & Units 11/28/2019 11/27/2019 11/26/2019  WBC 4.0 - 10.5 K/uL 12.8(H) 15.1(H) -  Hemoglobin 12.0 - 15.0 g/dL 8.8(L) 8.7(L) 9.2(L)  Hematocrit 36.0 - 46.0 % 28.9(L) 28.3(L) 29.3(L)  Platelets 150 - 400 K/uL 556(H) 492(H) -    ABG    Component Value Date/Time   PHART 7.390 11/22/2019 2027   PCO2ART 70.6 ([place]) 11/22/2019 2027   PO2ART 50.0 (L) 11/22/2019 2027   HCO3 42.6 (H) 11/22/2019 2027   TCO2 45 (H) 11/22/2019 2027   ACIDBASEDEF 4.0 (H) 11/12/2019 1504   O2SAT 81.0 11/22/2019 2027    CBG (last 3)  Recent Labs    11/28/19 0357 11/28/19 0748 11/28/19 1125  GLUCAP 125* 169* 96    CRITICAL CARE The patient is critically ill with multiple organ systems failure and requires high complexity decision making for assessment and support, frequent evaluation and titration of therapies, application of advanced monitoring technologies and extensive interpretation of multiple databases.   Critical Care Time devoted to patient care services described in this note is 35 minutes. This time reflects time of care of this [place] . This critical care time does not reflect separately billable procedures or procedure time, teaching time or supervisory time of PA/NP/Med student/Med Resident etc but could involve care discussion time.  [facility] 11/28/2019 12:03 PM  Pager: [phone] After hours pager: [phone]

## 2019-11-29 ENCOUNTER — Inpatient Hospital Stay (HOSPITAL_COMMUNITY): Payer: Medicare Other

## 2019-11-29 DIAGNOSIS — I358 Other nonrheumatic aortic valve disorders: Secondary | ICD-10-CM | POA: Diagnosis not present

## 2019-11-29 DIAGNOSIS — J9601 Acute respiratory failure with hypoxia: Secondary | ICD-10-CM | POA: Diagnosis not present

## 2019-11-29 DIAGNOSIS — N170 Acute kidney failure with tubular necrosis: Secondary | ICD-10-CM | POA: Diagnosis not present

## 2019-11-29 DIAGNOSIS — G9341 Metabolic encephalopathy: Secondary | ICD-10-CM | POA: Diagnosis not present

## 2019-11-29 LAB — BASIC METABOLIC PANEL
Anion gap: 8 (ref 5–15)
BUN: 38 mg/dL — ABNORMAL HIGH (ref 6–20)
CO2: 34 mmol/L — ABNORMAL HIGH (ref 22–32)
Calcium: 8.1 mg/dL — ABNORMAL LOW (ref 8.9–10.3)
Chloride: 108 mmol/L (ref 98–111)
Creatinine, Ser: 1.6 mg/dL — ABNORMAL HIGH (ref 0.44–1.00)
GFR calc Af Amer: 41 mL/min — ABNORMAL LOW (ref >60)
GFR calc non Af Amer: 35 mL/min — ABNORMAL LOW (ref >60)
Glucose, Bld: 117 mg/dL — ABNORMAL HIGH (ref 70–99)
Potassium: 3.6 mmol/L (ref 3.5–5.1)
Sodium: 150 mmol/L — ABNORMAL HIGH (ref 135–145)

## 2019-11-29 LAB — GLUCOSE, CAPILLARY
Glucose-Capillary: 104 mg/dL — ABNORMAL HIGH (ref 70–99)
Glucose-Capillary: 144 mg/dL — ABNORMAL HIGH (ref 70–99)
Glucose-Capillary: 236 mg/dL — ABNORMAL HIGH (ref 70–99)
Glucose-Capillary: 269 mg/dL — ABNORMAL HIGH (ref 70–99)
Glucose-Capillary: 280 mg/dL — ABNORMAL HIGH (ref 70–99)
Glucose-Capillary: 292 mg/dL — ABNORMAL HIGH (ref 70–99)
Glucose-Capillary: 98 mg/dL (ref 70–99)

## 2019-11-29 LAB — CBC
HCT: 29 % — ABNORMAL LOW (ref 36.0–46.0)
Hemoglobin: 8.4 g/dL — ABNORMAL LOW (ref 12.0–15.0)
MCH: 27.3 pg (ref 26.0–34.0)
MCHC: 29 g/dL — ABNORMAL LOW (ref 30.0–36.0)
MCV: 94.2 fL (ref 80.0–100.0)
Platelets: 570 10*3/uL — ABNORMAL HIGH (ref 150–400)
RBC: 3.08 MIL/uL — ABNORMAL LOW (ref 3.87–5.11)
RDW: 17.2 % — ABNORMAL HIGH (ref 11.5–15.5)
WBC: 10.2 10*3/uL (ref 4.0–10.5)
nRBC: 0.4 % — ABNORMAL HIGH (ref 0.0–0.2)

## 2019-11-29 LAB — POCT I-STAT 7, (LYTES, BLD GAS, ICA,H+H)
Acid-Base Excess: 11 mmol/L — ABNORMAL HIGH (ref 0.0–2.0)
Bicarbonate: 35.1 mmol/L — ABNORMAL HIGH (ref 20.0–28.0)
Calcium, Ion: 1.18 mmol/L (ref 1.15–1.40)
HCT: 23 % — ABNORMAL LOW (ref 36.0–46.0)
Hemoglobin: 7.8 g/dL — ABNORMAL LOW (ref 12.0–15.0)
O2 Saturation: 85 %
Patient temperature: 98.7
Potassium: 3.8 mmol/L (ref 3.5–5.1)
Sodium: 149 mmol/L — ABNORMAL HIGH (ref 135–145)
TCO2: 36 mmol/L — ABNORMAL HIGH (ref 22–32)
pCO2 arterial: 43.3 mmHg (ref 32.0–48.0)
pH, Arterial: 7.517 — ABNORMAL HIGH (ref 7.350–7.450)
pO2, Arterial: 46 mmHg — ABNORMAL LOW (ref 83.0–108.0)

## 2019-11-29 IMAGING — DX DG CHEST 1V PORT
1 series · 1 of 1 positions shown · non-contrast
Comparison: [DATE]

CLINICAL DATA: Acute respiratory failure

EXAM:
PORTABLE CHEST 1 VIEW

[chest ap]
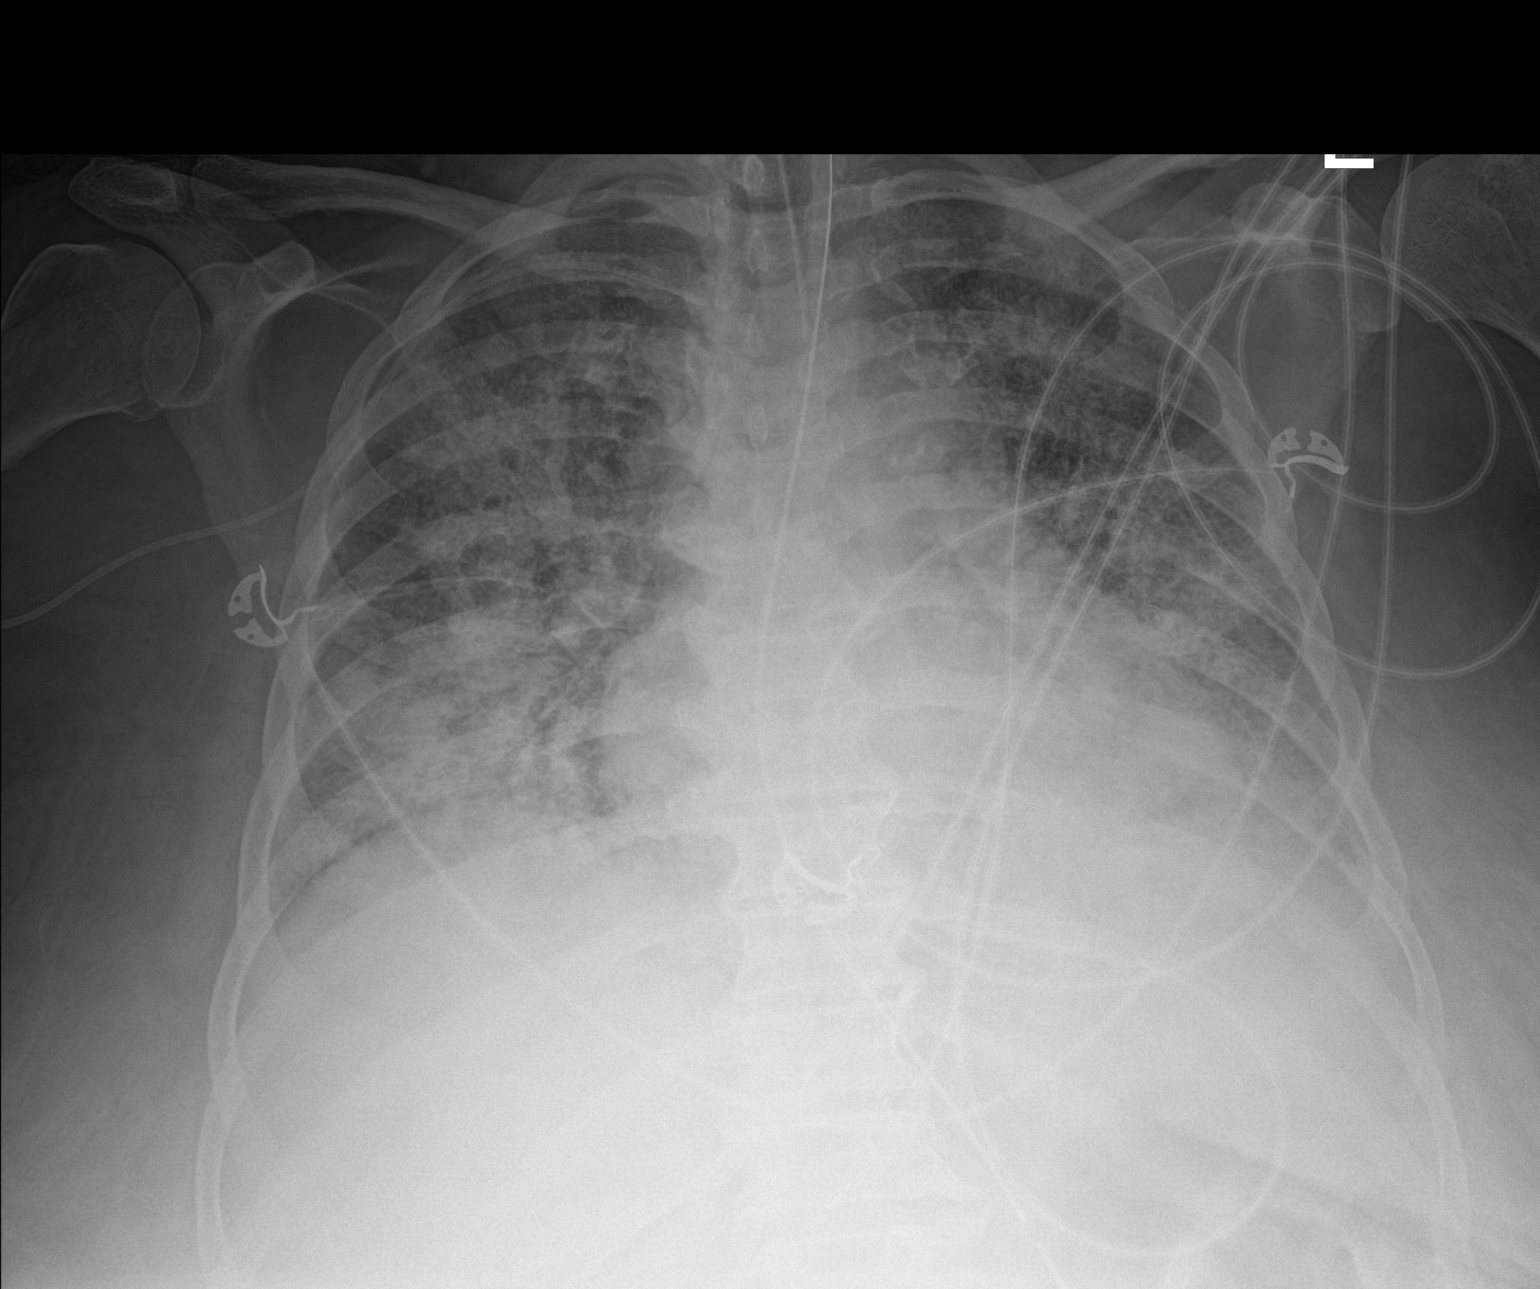

[1 of 1 positions shown; findings below may reference images not displayed]

FINDINGS: Endotracheal tube with the tip 4.5 cm above the carina. Nasogastric
tube projecting over the stomach.

Bilateral interstitial and alveolar airspace opacities. No pleural
effusion. No pneumothorax. Stable cardiomediastinal silhouette. No
aggressive osseous lesion.
IMPRESSION: Support lines and tubing as detailed above.

Bilateral interstitial and alveolar airspace opacities as can be
seen with multilobar pneumonia or ARDS.

## 2019-11-29 IMAGING — DX DG CHEST 1V PORT
1 series · 1 of 1 positions shown · non-contrast
Comparison: [DATE]

CLINICAL DATA: STATUS POST BRONCHOSCOPY

EXAM:
PORTABLE CHEST 1 VIEW

[chest ap]
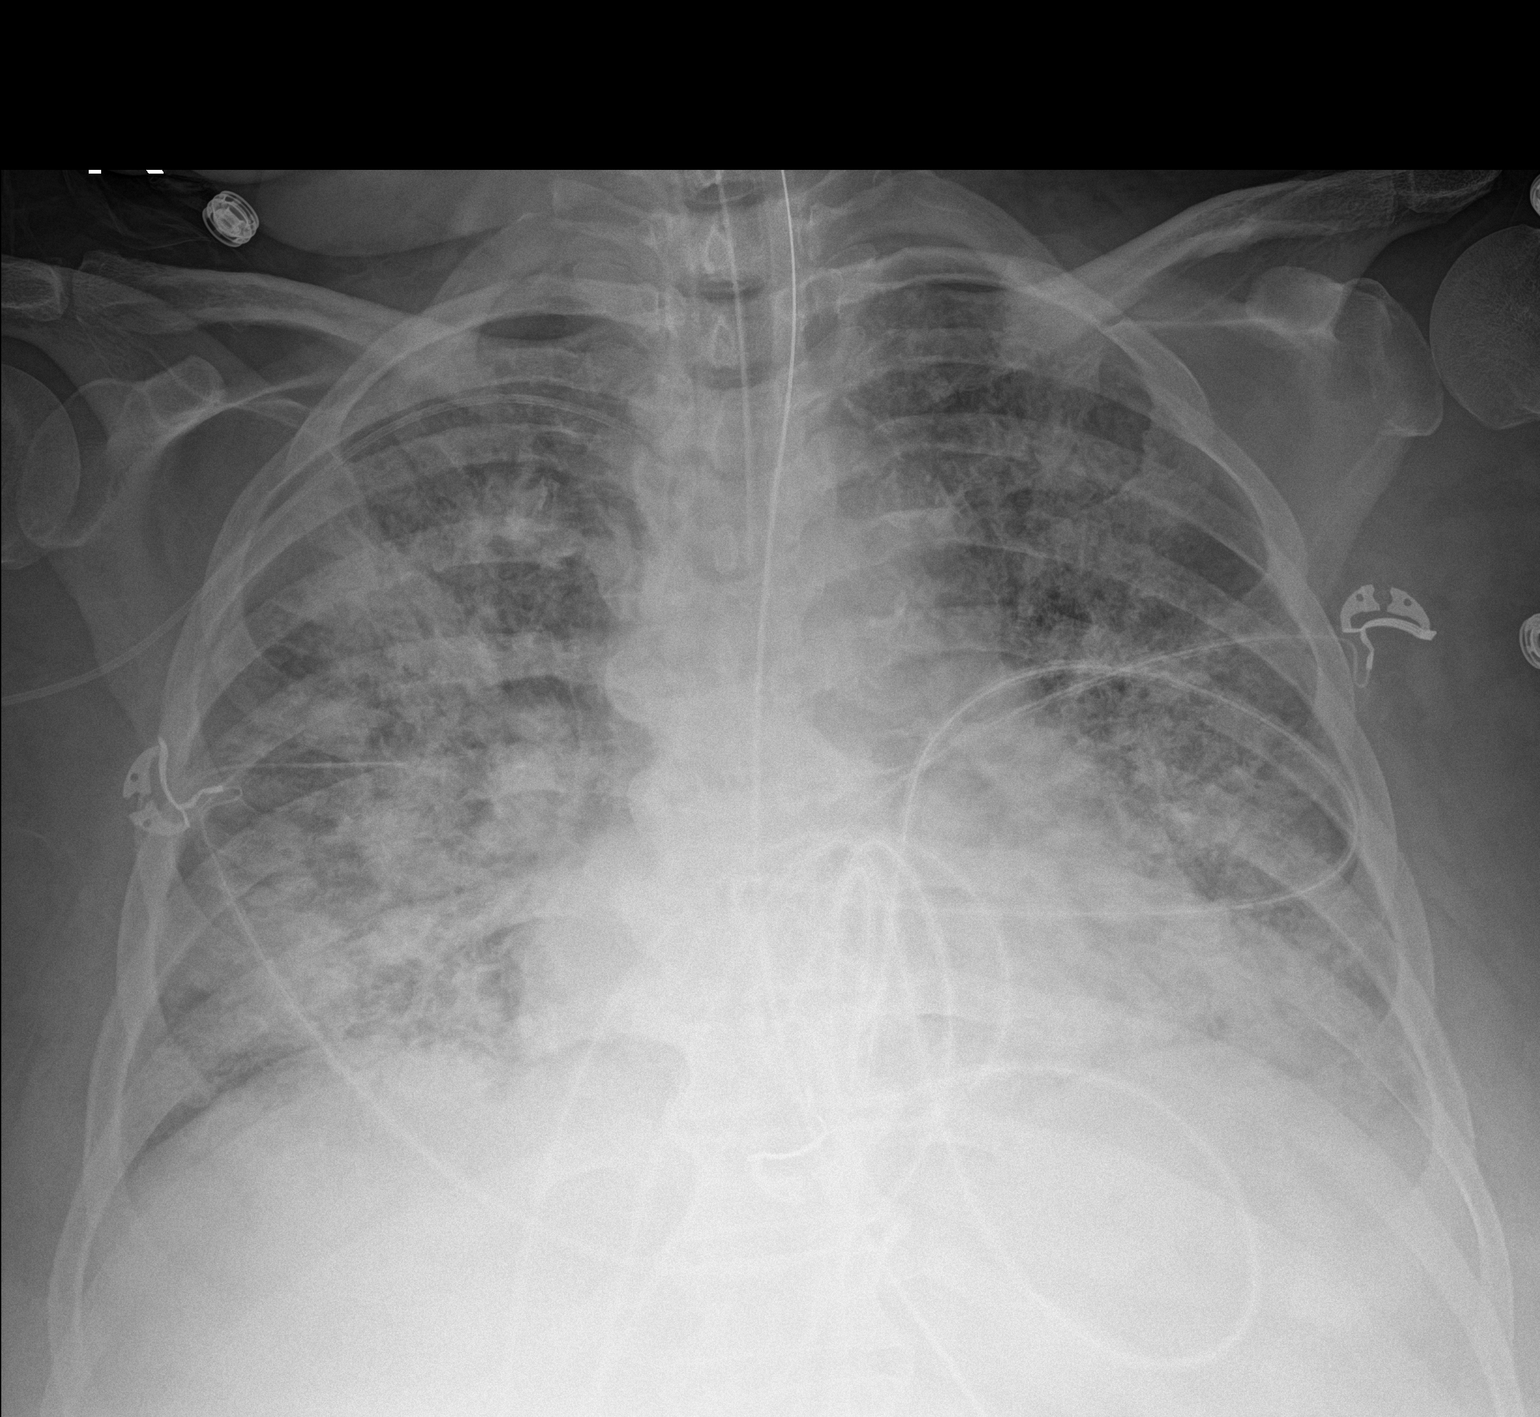

[1 of 1 positions shown; findings below may reference images not displayed]

FINDINGS: Endotracheal tube with the tip 3.1 cm above the carina. Nasogastric
tube coursing below the diaphragm. Right-sided PICC line with the
tip projecting over the confluence of the right subclavian vein and
jugular vein.

Bilateral interstitial and alveolar airspace opacities concerning
for multilobar pneumonia versus ARDS. No pleural effusion or
pneumothorax. Stable cardiomediastinal silhouette. No aggressive
osseous lesion.
IMPRESSION: 1. Bilateral interstitial and alveolar airspace opacities concerning
for multilobar pneumonia versus ARDS.
2. Support lines and tubing in unchanged position.

## 2019-11-29 MED ORDER — ALBUTEROL SULFATE (2.5 MG/3ML) 0.083% IN NEBU
INHALATION_SOLUTION | RESPIRATORY_TRACT | Status: AC
  Administered 2019-11-29: 2.5 mg via RESPIRATORY_TRACT
  Filled 2019-11-29: qty 3

## 2019-11-29 MED ORDER — PROPOFOL 1000 MG/100ML IV EMUL: 0.0000 ug/kg/min | INTRAVENOUS | Status: DC

## 2019-11-29 MED ORDER — FENTANYL CITRATE (PF) 100 MCG/2ML IJ SOLN: 25.0000 ug | INTRAMUSCULAR | Status: DC | PRN

## 2019-11-29 MED ORDER — MIDAZOLAM HCL 2 MG/2ML IJ SOLN
INTRAMUSCULAR | Status: AC
  Administered 2019-11-29: 1 mg via INTRAVENOUS
  Filled 2019-11-29: qty 2

## 2019-11-29 MED ORDER — METHYLPREDNISOLONE SODIUM SUCC 125 MG IJ SOLR
40.0000 mg | Freq: Two times a day (BID) | INTRAMUSCULAR | Status: DC
  Administered 2019-11-30 (×2): 40 mg via INTRAVENOUS
  Filled 2019-11-29 (×2): qty 2

## 2019-11-29 MED ORDER — IPRATROPIUM-ALBUTEROL 0.5-2.5 (3) MG/3ML IN SOLN
3.0000 mL | RESPIRATORY_TRACT | Status: DC
  Administered 2019-11-29 – 2019-12-02 (×19): 3 mL via RESPIRATORY_TRACT
  Filled 2019-11-29 (×19): qty 3

## 2019-11-29 MED ORDER — METHYLPREDNISOLONE SODIUM SUCC 125 MG IJ SOLR
60.0000 mg | Freq: Four times a day (QID) | INTRAMUSCULAR | Status: AC
  Administered 2019-11-29 – 2019-11-30 (×4): 60 mg via INTRAVENOUS
  Filled 2019-11-29 (×4): qty 2

## 2019-11-29 MED ORDER — ALBUTEROL (5 MG/ML) CONTINUOUS INHALATION SOLN
10.0000 mg/h | INHALATION_SOLUTION | RESPIRATORY_TRACT | Status: DC
  Filled 2019-11-29: qty 20

## 2019-11-29 MED ORDER — FENTANYL CITRATE (PF) 100 MCG/2ML IJ SOLN
25.0000 ug | INTRAMUSCULAR | Status: DC | PRN
  Administered 2019-11-29 – 2019-12-05 (×7): 100 ug via INTRAVENOUS
  Filled 2019-11-29 (×6): qty 2

## 2019-11-29 MED ORDER — VECURONIUM BROMIDE 10 MG IV SOLR
INTRAVENOUS | Status: AC
  Administered 2019-11-29: 10 mg
  Filled 2019-11-29: qty 10

## 2019-11-29 MED ORDER — ALBUTEROL SULFATE (2.5 MG/3ML) 0.083% IN NEBU
INHALATION_SOLUTION | RESPIRATORY_TRACT | Status: AC
  Administered 2019-11-29: 7.5 mg via RESPIRATORY_TRACT
  Filled 2019-11-29: qty 9

## 2019-11-29 MED ORDER — VECURONIUM BROMIDE 10 MG IV SOLR: 10.0000 mg | Freq: Once | INTRAVENOUS | Status: DC

## 2019-11-29 MED ORDER — IPRATROPIUM-ALBUTEROL 0.5-2.5 (3) MG/3ML IN SOLN
3.0000 mL | Freq: Four times a day (QID) | RESPIRATORY_TRACT | Status: DC | PRN
  Administered 2019-11-29: 6 mL via RESPIRATORY_TRACT
  Administered 2019-12-19: 3 mL via RESPIRATORY_TRACT
  Filled 2019-11-29: qty 3
  Filled 2019-11-29: qty 6

## 2019-11-29 MED ORDER — MIDAZOLAM HCL 2 MG/2ML IJ SOLN
0.5000 mg | INTRAMUSCULAR | Status: DC | PRN
  Administered 2019-11-29 – 2019-12-07 (×8): 1 mg via INTRAVENOUS
  Filled 2019-11-29 (×9): qty 2

## 2019-11-29 MED ORDER — VECURONIUM BOLUS VIA INFUSION: 10.0000 mg | Freq: Once | INTRAVENOUS | Status: DC

## 2019-11-29 MED ORDER — MIDAZOLAM HCL 2 MG/2ML IJ SOLN: 2.0000 mg | Freq: Once | INTRAMUSCULAR | Status: DC

## 2019-11-29 MED ORDER — MIDAZOLAM HCL 2 MG/2ML IJ SOLN
0.5000 mg | INTRAMUSCULAR | Status: DC | PRN
  Filled 2019-11-29: qty 2

## 2019-11-29 MED ORDER — MIDAZOLAM HCL 2 MG/2ML IJ SOLN
INTRAMUSCULAR | Status: AC
  Administered 2019-11-29: 2 mg
  Filled 2019-11-29: qty 2

## 2019-11-29 MED ORDER — FENTANYL 2500MCG IN NS 250ML (10MCG/ML) PREMIX INFUSION
0.0000 ug/h | INTRAVENOUS | Status: DC
  Administered 2019-11-29: 50 ug/h via INTRAVENOUS
  Administered 2019-11-29: 200 ug/h via INTRAVENOUS
  Administered 2019-11-30: 100 ug/h via INTRAVENOUS
  Filled 2019-11-29 (×3): qty 250

## 2019-11-29 MED ORDER — MIDAZOLAM HCL 2 MG/2ML IJ SOLN
2.0000 mg | Freq: Once | INTRAMUSCULAR | Status: AC
  Administered 2019-11-29: 2 mg via INTRAVENOUS

## 2019-11-29 MED ORDER — NOREPINEPHRINE 4 MG/250ML-% IV SOLN
0.0000 ug/min | INTRAVENOUS | Status: DC
  Administered 2019-11-29: 10 ug/min via INTRAVENOUS
  Filled 2019-11-29: qty 250

## 2019-11-29 NOTE — Progress Notes (Signed)
NAME:  Susan [name], MRN:  [id number], DOB:  [birth year], LOS: 65 ADMISSION DATE:  11/11/2019, CONSULTATION DATE:  3/10 REFERRING MD:  Sabra Heck, CHIEF COMPLAINT:  Acute encephalopathy and respiratory failure    Brief History   59 year old female smoker admitted 3/10 with overdose complicated by aspiration PNA with MRSA,  Pneumococcal bacteremia, toxic metabolic encephalopathy and septic shock.    Past Medical History  Frequent falls at home,Tobacco abuse, diabetes with painful polyneuropathy, bipolar disease, GERD, HIV, schizophrenia, history of IV drug abuse, seizure disorder.   Significant Hospital Events   3/10 Admit with overdose, intubated on arrival, in shock   3/15 Ventilator asynchrony, heavy sedation & intermittent paralysis   3/16 Lasix drip.  Awaiting TEE.  Sputum w/ MRSA.   Coming back down on sedation, changed to [facility]. TEE showing NO EVIDENCE of endocarditis. ABX changed to pen G per ID. MRSA therapy complete. EF improved. Now up to 50% from 20-25% 3/17 Heavily sedated. Having episodes of bradycardia. Still massively overloaded (>14L), increasing lasix gtt to 8 mg from 4, adding metolazone.  3/19 Lasix to 40 every 12, changing RASS goal to -2, decrease in fentanyl, decreasing PEEP, anticipating trialing pressure support ventilation.  Watching upward trend of white blood cell count and low-grade fever 3/20 BM early am. Agitation early am > sedate post am PT psy meds 3/22 Decreased mentation and movement of extremities overnight, sent for Head CT which revealed acute vs subacute bilateral MCA infarcts  3/23 Lying in bed seen spontaneously moving lower and right upper extremity, unable to follow any commands.  3/24 Hypoglycemic episodes overnight, Weaning well 10/5 today, tmax 101.5 Remains on precedex gtt 1.2, off fentanyl, ongoing intermittent agitation/ tachypnea better after disimpacted.   Consults:  ID Cards for TEE Neurology   Procedures:  ETT 3/10 >>  R Fem TLC (ER) 3/10   R IJ TLC 3/10  L IJ TLC 3/10 >> 3/17  RUE PICC 3/16 >>   Significant Diagnostic Tests:   CT brain 3/10 >> Normal head CT  EEG 3/10 >> Moderate to severe diffuse encephalopathy. No seizures  Urine drug screen 3/10 >> positive for THC, acetaminophen Q000111Q, salicylate <7, ETOH Q000111Q  ECHO 3/11 >> EF 20-25%, LV function severely diminished, Global Hypokinesis,LV hypertrophy, Grade 1 Diastolic Dysfunction, Elevated PASP, Dilated LA, MV regurgitation, small mobile density right coronary cusp  TEE 3/16 >> LVEF 55%, No LA/LAA thrombus or masses, Previously noted mass on the aortic valve is a node of Arancius.  Normal variant and no evidence of endocarditis.Trivial TR and trivial PR 3/23 MRI brain >>Acute nonhemorrhagic infarcts in the posterior MCA territory bilaterally. Otherwise no evidence of chronic ischemia. 3/23 MRA head >>No significant intracranial stenosis.  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 3/23 MRV head >> negative  Micro Data:  Influenza A/B 3/10 >> negative  COVID 3/10 >> negative  MRSA PCR 3/10 >> positive  BCx2 3/10 >> streptococcus pneumoniae >> S-rocephin BCx1 (aerobic only) 3/10 >> negative  Tracheal aspirate 3/11 >> MRSA BCx2 3/12 >> negative   Antimicrobials:  Vancomycin 3/10 >> 3/16 Unasyn 3/10 >> 3/16 Pen G 3/16 >>   Interim history/subjective:  Overnight had ventilator assynchrony with high peak pressures.   Objective   Blood pressure (!) 66/25, pulse (!) 103, temperature 99 F (37.2 C), temperature source Axillary, resp. rate (!) 24, height 5\' 3"  (1.6 m), weight 104.6 kg, SpO2 98 %.    Vent Mode: PCV FiO2 (%):  [30 %-100 %] 100 %  Set Rate:  [22 bmp-30 bmp] 30 bmp Vt Set:  [420 mL] 420 mL PEEP:  [5 cmH20] 5 cmH20 Plateau Pressure:  [23 cmH20-30 cmH20] 30 cmH20   Intake/Output Summary (Last 24 hours) at 11/29/2019 0945 Last data filed at 11/29/2019 0700 Gross per 24 hour  Intake 3145.44 ml  Output 1075 ml  Net 2070.44 ml     Filed Weights   11/25/19 2213 11/27/19 0500 11/28/19 0443  Weight: 103.2 kg 104.2 kg 104.6 kg   Examination: General:  Older female agitated on MV  HEENT: MM pink/moist, ETT at 20, OGT, pupils equal/reactive Neuro: Awake, makes eye contact/ tracks and sucking sounds around ETT, mildly agitated tapping her toes and grabbing side rails, not following commands.  Localizes b/, weaker in LUE/ LLE, withdraws in LE  CV: rr, no murmur PULM:  Currently on PSV 5/5 with adequate volumes, tachypnea ~28-30, lung sounds clear, no copious oral/ ETT secretions GI: obese, soft, bs+, purwick, NT Extremities: warm/dry, generalized +1 edema, RUE PICC wnl  Skin: no rashes   Resolved Hospital Problem list   Rhabdomyolysis  Elevated INR Septic shock   Assessment & Plan:   Acute hypoxic/hypercapnic respiratory failure with ARDS from MRSA PNA and aspiration pneumonia. Hx of asthma. Plan: Full MV support w/daily PS trials - not tolerating today due to ventilator instability.  Maintain precedex, fentanyl. May need to transition back to propofol.  Plan is for tracheostomy - will discuss with family, tentatively Monday 3/29. Had large mucus plug this morning.  Given her large b/l MCA strokes, airway protection remains a concern.   VAP bundle Intermittent CXR/ ABG as needed Continue duonebs and prn albuterol    Acute bilateral large bilateral posterior MCA infarcts - 3/23  -TEE 3/16 with no sign of endocarditis  - MRI brain c/w b/l posterior MCA territory infarcts; MRA/ MRV neg Plan: Neurology following, appreciate assistance  Ongoing neuro exams  Hold ASA PT/ OT when able. Not currently cooperative enough or with stable vent.  MRSA and aspiration pneumonia, and Pneumococcal bacteremia -Completed ABx for MRSA HCAP 3/16 -ID s/o as of 3/20 Plan: Completed Penicillin on 3/23 Trend WBC/ fever curve   Hx of HIV Plan: Continue home Descovy and Tivicay   Acute systolic heart failure with sepsis  induced CM. Hx of HTN, HLD. Plan: Holding home beta blocker, clonidine, norvasc, vasotec, and maxide on hold Continue crestor   Acute metabolic encephalopathy 2nd to sepsis, hypoxia and delirium  Hx of schizophrenia, seizures. Plan: RASS goal 0 to -1 continue higher dose precedex with fentanyl gtt to PRN  Not been able to wean precedex at all   Continue Cogentin, Buspar, Doxepin, Neurotin, and Zoloft  Delirium precautions  Acute Kidney Injury 2nd to ATN in setting of septic shock and hypoxia. Hypokalemia, Hypernatremia - baseline creatinine 0.78 from 06/29/19 Plan: Monitor K and Mag.  Strict I/Os/ trend renal function  Renal panel/ mag in am  May need additional diuresis.   Constipation. Plan: Continue bowel regiment - scheduled miralax, senokot, prn dulcolax  DM type II poorly controlled with hyperglycemia. - HbA1C 12.3 from 11/11/19 Plan: TF and SSI continue lantus 12 units BID   GOC  Plan for trach. Possibly Monday 3/29  Best practice:  Diet: tube feeds DVT prophylaxis: SQ heparin GI prophylaxis: protonix Mobility: BR Code Status: full code  Family Communication: see family meeting documentation from 3/26. I followed up with [name] Chiquita Loth to follow-up about tracheostomy.  She was under the impression that  she would have more time to think about this decision.  I let her know that her mom's been in the ICU for 17 days, and followed up and reiterated that the best way to move forward to help take care of mom and give her the best chance to come off the ventilator would be to proceed with tracheostomy.  I discussed that we would like to do this on Monday 3/29 but the daughter feels pushed.  She is given talk with her brothers tonight and I told her I would follow-up about this on Monday morning. Disposition: to ICU   Labs:   CMP Latest Ref Rng & Units 11/29/2019 11/29/2019 11/28/2019  Glucose 70 - 99 mg/dL - 117(H) 156(H)  BUN 6 - 20 mg/dL - 38(H)  36(H)  Creatinine 0.44 - 1.00 mg/dL - 1.60(H) 1.67(H)  Sodium 135 - 145 mmol/L 149(H) 150(H) 150(H)  Potassium 3.5 - 5.1 mmol/L 3.8 3.6 4.0  Chloride 98 - 111 mmol/L - 108 108  CO2 22 - 32 mmol/L - 34(H) 32  Calcium 8.9 - 10.3 mg/dL - 8.1(L) 8.1(L)  Total Protein 6.5 - 8.1 g/dL - - -  Total Bilirubin 0.3 - 1.2 mg/dL - - -  Alkaline Phos 38 - 126 U/L - - -  AST 15 - 41 U/L - - -  ALT 0 - 44 U/L - - -    CBC Latest Ref Rng & Units 11/29/2019 11/29/2019 11/28/2019  WBC 4.0 - 10.5 K/uL - 10.2 12.8(H)  Hemoglobin 12.0 - 15.0 g/dL 7.8(L) 8.4(L) 8.8(L)  Hematocrit 36.0 - 46.0 % 23.0(L) 29.0(L) 28.9(L)  Platelets 150 - 400 K/uL - 570(H) 556(H)    ABG    Component Value Date/Time   PHART 7.517 (H) 11/29/2019 0548   PCO2ART 43.3 11/29/2019 0548   PO2ART 46.0 (L) 11/29/2019 0548   HCO3 35.1 (H) 11/29/2019 0548   TCO2 36 (H) 11/29/2019 0548   ACIDBASEDEF 4.0 (H) 11/12/2019 1504   O2SAT 85.0 11/29/2019 0548    CBG (last 3)  Recent Labs    11/29/19 0015 11/29/19 0450 11/29/19 0845  GLUCAP 144* 98 104*    CRITICAL CARE The patient is critically ill with multiple organ systems failure and requires high complexity decision making for assessment and support, frequent evaluation and titration of therapies, application of advanced monitoring technologies and extensive interpretation of multiple databases.   Critical Care Time devoted to patient care services described in this note is 49 minutes. This time reflects time of care of this [place] . This critical care time does not reflect separately billable procedures or procedure time, teaching time or supervisory time of PA/NP/Med student/Med Resident etc but could involve care discussion time.  [facility] 11/29/2019 9:45 AM  Pager: [phone] After hours pager: [phone]

## 2019-11-29 NOTE — Progress Notes (Addendum)
Informed nurse Elzie Rings pt with vent asynchrony approximately one hour after after precedex titrated down from 1.7 mg/hr to  0.8 mg/hr, oxygen sats in 90s. Elzie Rings will inform md.

## 2019-11-29 NOTE — Progress Notes (Signed)
RT at pt bedside, pt sats in 80s. Dr Patsey Berthold in department, informed md pt oxygen sats in 80s. Will page and inform Dr Shearon Stalls of pt sats in 65s.

## 2019-11-29 NOTE — Progress Notes (Signed)
[facility] Progress Note Patient Name: Susan [name] DOB: 23-Aug-1961 MRN: [id number]   Date of Service  11/29/2019  HPI/Events of Note  Ventilator dyssynchrony  eICU Interventions  Fentanyl infusion started, check ABG.        [name] Darick Fetters 11/29/2019, 4:58 AM

## 2019-11-29 NOTE — Progress Notes (Signed)
Following bedside bronchoscopy, patient acutely hypotensive. Patient tolerated procedure well, without hypotension. Nursing is down-titrating sedation. Will Continue to follow hemodynamics, MAP goal > 65    Eliseo Gum MSN, AGACNP-BC [place] [id number] If no answer, [id number] 11/29/2019, 9:57 AM

## 2019-11-29 NOTE — Progress Notes (Signed)
Dr Patsey Berthold arrived to room, md increased pt fio2 to 100%, oxygen sats increased to 100%

## 2019-11-29 NOTE — Progress Notes (Signed)
Pt conts to have vent asynchrony and no apparent agitation on maximum precedex drip and prn versed ivp, oxygen sats in 90s. Fentanyl  drip started and bolus given.

## 2019-11-29 NOTE — Progress Notes (Signed)
eLink Physician-Brief Progress Note Patient Name: Susan [name] DOB: [birth year] MRN: [id number]   Date of Service  11/29/2019  HPI/Events of Note  Pt agitated despite 1.7 mcg infusion of Precedex  eICU Interventions  Versed 0.5-1.0 mg iv Q 2 hours PRN agitation, Fentanyl 25-50 mcg iv Q 2 hours PRN agitation.        [name] Mosiah Bastin 11/29/2019, 3:18 AM

## 2019-11-29 NOTE — Progress Notes (Signed)
eLink Physician-Brief Progress Note Patient Name: ZYNIQUE SWEARENGEN DOB: 1961/07/12 MRN: [id number]   Date of Service  11/29/2019  HPI/Events of Note  Ventilator dyssynchrony, high peak airway pressures.  eICU Interventions  Versed 2 mg iv x1, Vecuronium 10 mg iv x 1, stat portable CXR.        [name] Gissel Keilman 11/29/2019, 6:20 AM

## 2019-11-29 NOTE — Progress Notes (Addendum)
This rn and RT at pt bedside. Camera visual done by Dr Lucile Shutters to assess pt vent asynchrony, oxygen sats in 90s. Orders given to administer ativan and fentanyl ivp, vent changes done by RT.

## 2019-11-29 NOTE — Progress Notes (Addendum)
This rn and RT at pt bedside, camera visual done by Dr Lucile Shutters. Pt with continuous vent asynchrony. No bilateral breath sounds to auscultation,  oxygen sats in 90s. Pt suctioned for minimal secretions.  Vecuronium 10 mg and 2 mg versed ivp given. Received order from md for continuous neb.

## 2019-11-29 NOTE — Procedures (Addendum)
Bronchoscopy Procedure Note Susan [name] [id number] 02-Nov-1960  Procedure: Bronchoscopy Indications: Remove secretions Patient with escalating peak pressures, difficult to bag, RRT unable to pass suction catheter.   Procedure Details Consent: Unable to obtain consent because of emergent medical necessity.  Time Out: Verified patient identification, verified procedure, site/side was marked, verified correct patient position, special equipment/implants available, medications/allergies/relevent history reviewed, required imaging and test results available.  Performed  In preparation for procedure, patient was given 100% FiO2 and bronchoscope lubricated. Sedation: Fentanyl, precedex  Airway entered and the following bronchi were examined: ETT, bronchi. Mucus plug found to be nearly occluding distal end of ETT. 60ml saline instilled, plug removed. Bilateral bronchi examines and were clear.  Evaluation Hemodynamic Status: BP stable throughout  O2 sats: transiently fell during during procedure to SpO2 93%. Recovered immediately.  Patient's Current Condition: stable Specimens:  None Complications: No apparent complications Patient did tolerate procedure well.   Cristal Generous 11/29/2019

## 2019-11-29 NOTE — Progress Notes (Signed)
eLink Physician-Brief Progress Note Patient Name: Susan [name] DOB: 06-Jan-1961 MRN: [id number]   Date of Service  11/29/2019  HPI/Events of Note  Pt with ventilator dyssynchrony and inadequate sedation on maximum infusion rate of Precedex, also mild scattered wheezes, ETT was advanced 2 cm earlier.  eICU Interventions  PRN Versed and Fentanyl ordered and given with resolution of dyssynchrony, Duonebs ordered for wheezing, portable CXR ordered to check ET tube position.        [name] [name] 11/29/2019, 3:56 AM

## 2019-11-30 ENCOUNTER — Inpatient Hospital Stay (HOSPITAL_COMMUNITY): Payer: Medicare Other

## 2019-11-30 DIAGNOSIS — G9341 Metabolic encephalopathy: Secondary | ICD-10-CM | POA: Diagnosis not present

## 2019-11-30 DIAGNOSIS — J9601 Acute respiratory failure with hypoxia: Secondary | ICD-10-CM | POA: Diagnosis not present

## 2019-11-30 DIAGNOSIS — N170 Acute kidney failure with tubular necrosis: Secondary | ICD-10-CM | POA: Diagnosis not present

## 2019-11-30 DIAGNOSIS — I63413 Cerebral infarction due to embolism of bilateral middle cerebral arteries: Secondary | ICD-10-CM | POA: Diagnosis not present

## 2019-11-30 LAB — PROTIME-INR
INR: 1.2 (ref 0.8–1.2)
Prothrombin Time: 15.1 seconds (ref 11.4–15.2)

## 2019-11-30 LAB — GLUCOSE, CAPILLARY
Glucose-Capillary: 227 mg/dL — ABNORMAL HIGH (ref 70–99)
Glucose-Capillary: 239 mg/dL — ABNORMAL HIGH (ref 70–99)
Glucose-Capillary: 242 mg/dL — ABNORMAL HIGH (ref 70–99)
Glucose-Capillary: 279 mg/dL — ABNORMAL HIGH (ref 70–99)
Glucose-Capillary: 315 mg/dL — ABNORMAL HIGH (ref 70–99)
Glucose-Capillary: 338 mg/dL — ABNORMAL HIGH (ref 70–99)

## 2019-11-30 LAB — TRIGLYCERIDES: Triglycerides: 93 mg/dL (ref <150)

## 2019-11-30 LAB — APTT: aPTT: 26 seconds (ref 24–36)

## 2019-11-30 IMAGING — DX DG CHEST 1V PORT
1 series · 1 of 1 positions shown · non-contrast
Comparison: Yesterday

CLINICAL DATA: Acute respiratory failure

EXAM:
PORTABLE CHEST 1 VIEW

[chest ap]
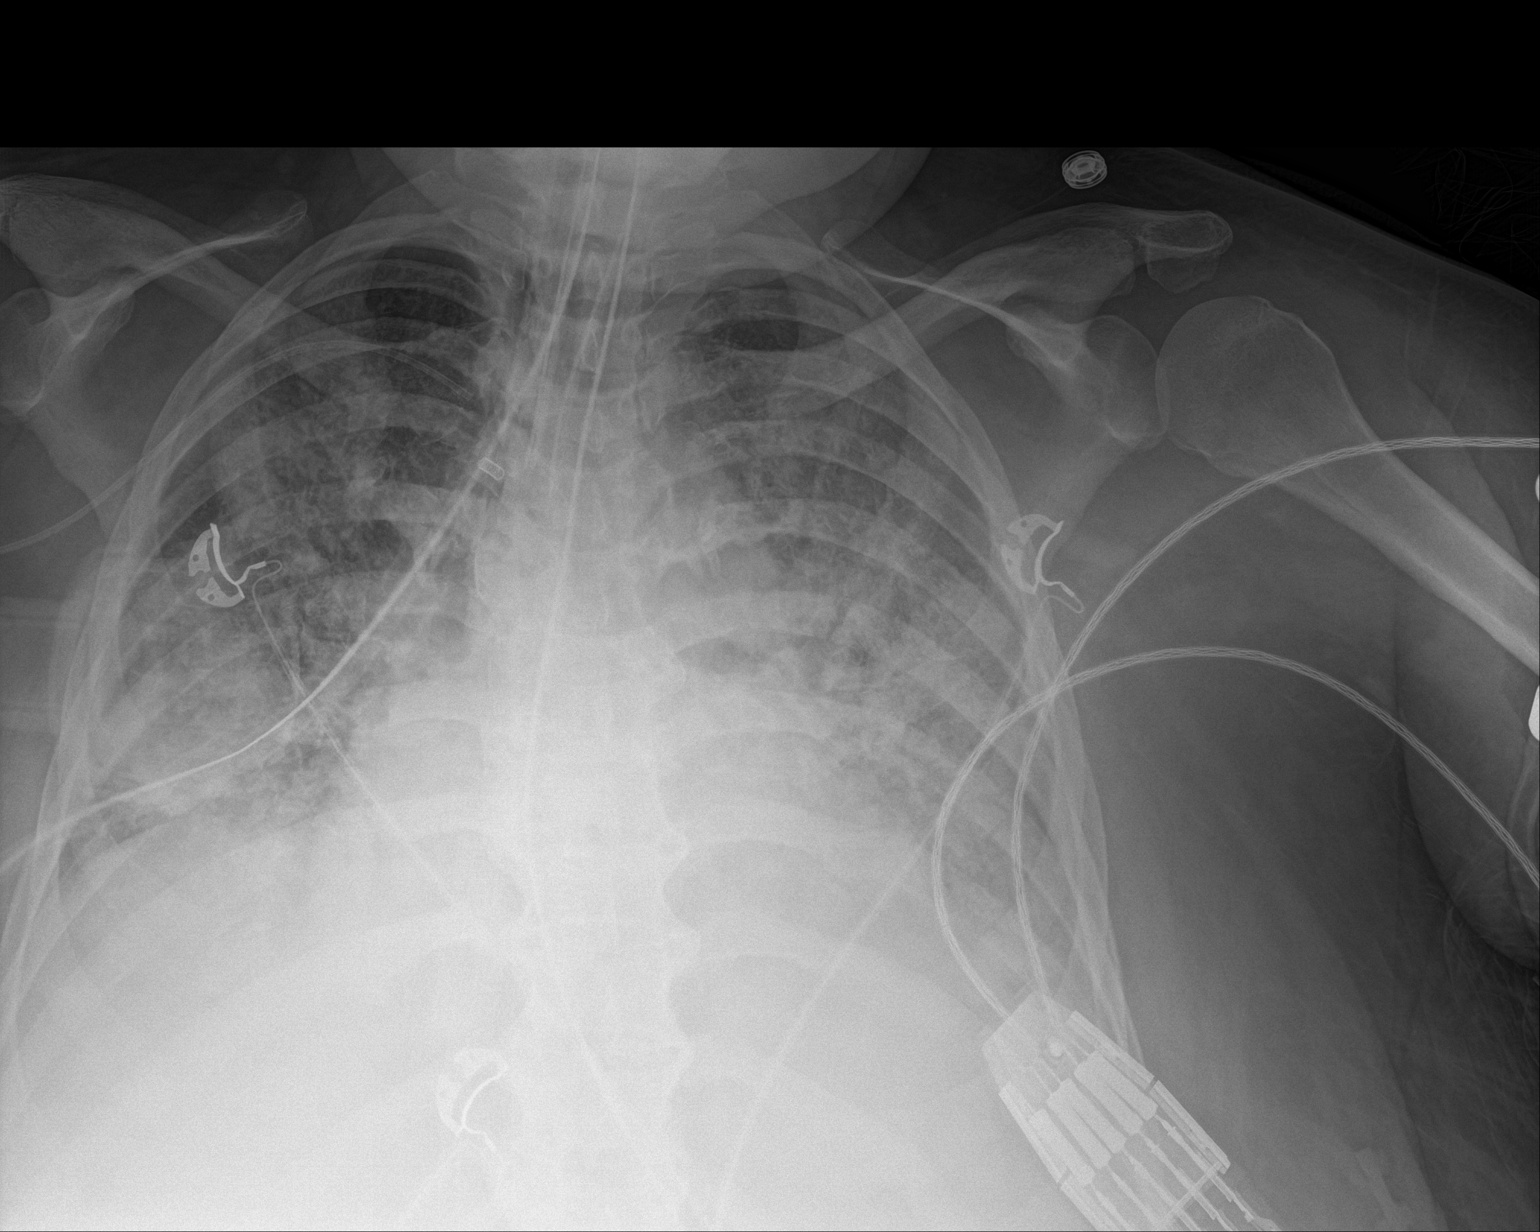

[1 of 1 positions shown; findings below may reference images not displayed]

FINDINGS: Endotracheal tube tip is just below the clavicular heads. The
orogastric tube at least reaches the stomach. Right PICC with tip at
the distal right subclavian vein region. Bilateral airspace disease.
No visible effusion or pneumothorax.
IMPRESSION: 1. Unchanged extensive airspace disease.
2. Stable hardware positioning including right PICC tip at the
distal brachiocephalic.

## 2019-11-30 IMAGING — DX DG CHEST 1V PORT
1 series · 1 of 1 positions shown · non-contrast
Comparison: [DATE] study obtained earlier in the day

CLINICAL DATA: Tracheostomy placement

EXAM:
PORTABLE CHEST 1 VIEW

[chest ap]
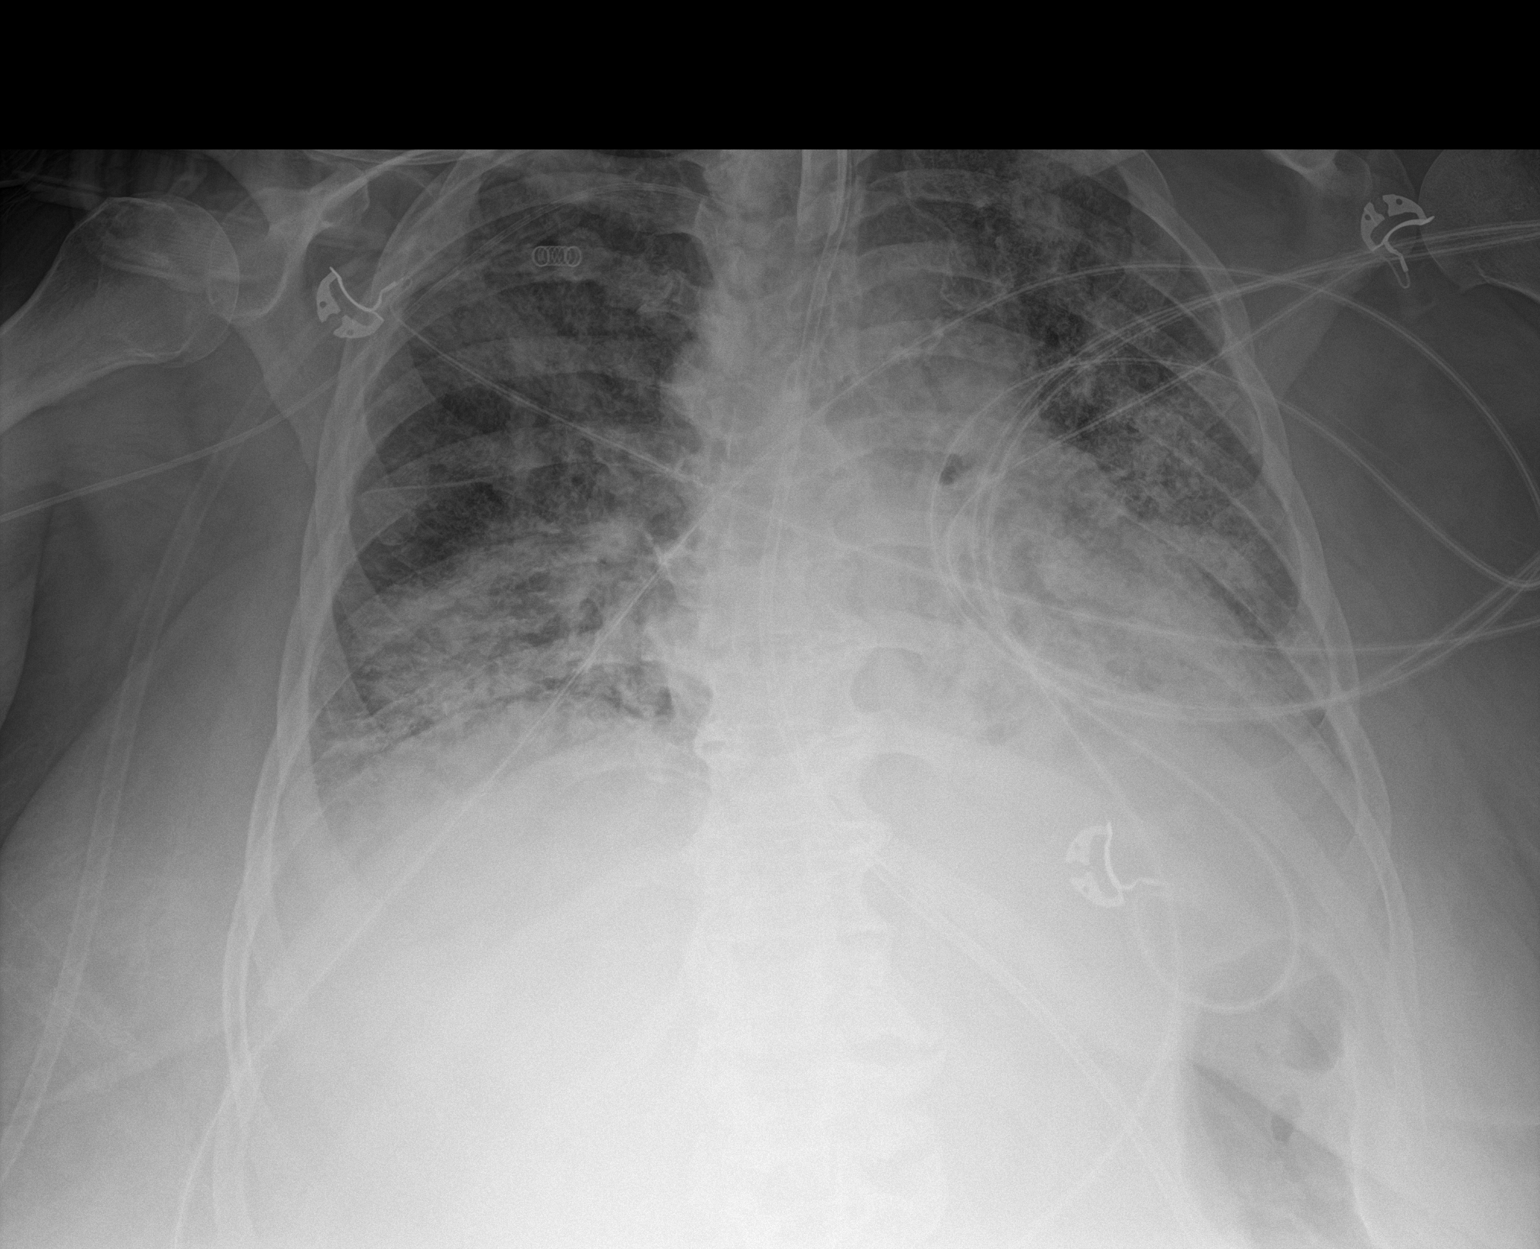

[1 of 1 positions shown; findings below may reference images not displayed]

FINDINGS: There is now a tracheostomy catheter present with tip 3.7 cm above
the carina. Feeding tube tip is below the diaphragm. No
pneumothorax. There is airspace opacity throughout the lungs
bilaterally with consolidation greatest in the bases. The heart is
upper normal in size with pulmonary vascularity normal. No
adenopathy. No bone lesions.
IMPRESSION: Tube positions as described without pneumothorax. Bilateral airspace
opacity similar to earlier in the day, likely due to multifocal
pneumonia. A degree of superimposed edema cannot be excluded. Stable
cardiac silhouette.

## 2019-11-30 MED ORDER — MIDAZOLAM HCL 2 MG/2ML IJ SOLN
5.0000 mg | Freq: Once | INTRAMUSCULAR | Status: AC
  Administered 2019-11-30: 5 mg via INTRAVENOUS
  Filled 2019-11-30: qty 6

## 2019-11-30 MED ORDER — INSULIN GLARGINE 100 UNIT/ML ~~LOC~~ SOLN
10.0000 [IU] | Freq: Once | SUBCUTANEOUS | Status: AC
  Administered 2019-11-30: 10 [IU] via SUBCUTANEOUS
  Filled 2019-11-30: qty 0.1

## 2019-11-30 MED ORDER — CLONIDINE HCL 0.2 MG PO TABS
0.4000 mg | ORAL_TABLET | Freq: Four times a day (QID) | ORAL | Status: DC
  Administered 2019-11-30 – 2019-12-02 (×8): 0.4 mg
  Filled 2019-11-30 (×8): qty 2

## 2019-11-30 MED ORDER — ETOMIDATE 2 MG/ML IV SOLN
40.0000 mg | Freq: Once | INTRAVENOUS | Status: AC
  Administered 2019-11-30: 20 mg via INTRAVENOUS
  Filled 2019-11-30: qty 20

## 2019-11-30 MED ORDER — INSULIN GLARGINE 100 UNIT/ML ~~LOC~~ SOLN
22.0000 [IU] | Freq: Two times a day (BID) | SUBCUTANEOUS | Status: DC
  Administered 2019-11-30 – 2019-12-04 (×8): 22 [IU] via SUBCUTANEOUS
  Filled 2019-11-30 (×10): qty 0.22

## 2019-11-30 MED ORDER — VECURONIUM BROMIDE 10 MG IV SOLR
10.0000 mg | Freq: Once | INTRAVENOUS | Status: AC
  Administered 2019-11-30: 10 mg via INTRAVENOUS
  Filled 2019-11-30: qty 10

## 2019-11-30 MED ORDER — FENTANYL CITRATE (PF) 100 MCG/2ML IJ SOLN
200.0000 ug | Freq: Once | INTRAMUSCULAR | Status: AC
  Administered 2019-11-30: 200 ug via INTRAVENOUS
  Filled 2019-11-30: qty 4

## 2019-11-30 MED ORDER — INSULIN ASPART 100 UNIT/ML ~~LOC~~ SOLN
10.0000 [IU] | SUBCUTANEOUS | Status: DC
  Administered 2019-11-30 – 2019-12-11 (×39): 10 [IU] via SUBCUTANEOUS

## 2019-11-30 NOTE — Procedures (Signed)
Bronchoscopy Dr. Marva Panda under direction of Dr. Tamala Julian  Indication: Tracheostomy placement assistance  Consent: Signed in chart  Anesthesia: in place for trach  Procedure - Timeout performed - Bronchoscope advanced through ETT - Airways examined down to subsegmental level - Following airway examination, therepeutic suctioning of thick mucoid secretions arising from left mainstem performed (partially obstructing) - Subsequently direct visualization provided for placement of percutaneous tracheostomy by Dr. Salomon Mast  Findings - Acceptable position of tracheostomy above carina  Specimen(s): None  Complications: None immediate

## 2019-11-30 NOTE — Progress Notes (Signed)
Assisted Dr. Tamala Julian and Dr Lynetta Mare at bedside performing a bedside tracheostomy.Pt on 100% during procedure. A #6 cuffed Shiley was placed. Pt tolerated well, RT will continue to monitor.

## 2019-11-30 NOTE — Progress Notes (Signed)
NAME:  Susan [name], MRN:  [id number], DOB:  09/21/1960, LOS: 87 ADMISSION DATE:  11/11/2019, CONSULTATION DATE:  3/10 REFERRING MD:  Sabra Heck, CHIEF COMPLAINT:  Acute encephalopathy and respiratory failure    Brief History   59 year old female smoker admitted 3/10 with overdose complicated by aspiration PNA with MRSA,  Pneumococcal bacteremia, toxic metabolic encephalopathy and septic shock.    Past Medical History  Frequent falls at home,Tobacco abuse, diabetes with painful polyneuropathy, bipolar disease, GERD, HIV, schizophrenia, history of IV drug abuse, seizure disorder.   Significant Hospital Events   3/10 Admit with overdose, intubated on arrival, in shock   3/15 Ventilator asynchrony, heavy sedation & intermittent paralysis   3/16 Lasix drip.  Awaiting TEE.  Sputum w/ MRSA.   Coming back down on sedation, changed to [facility]. TEE showing NO EVIDENCE of endocarditis. ABX changed to pen G per ID. MRSA therapy complete. EF improved. Now up to 50% from 20-25% 3/17 Heavily sedated. Having episodes of bradycardia. Still massively overloaded (>14L), increasing lasix gtt to 8 mg from 4, adding metolazone.  3/19 Lasix to 40 every 12, changing RASS goal to -2, decrease in fentanyl, decreasing PEEP, anticipating trialing pressure support ventilation.  Watching upward trend of white blood cell count and low-grade fever 3/20 BM early am. Agitation early am > sedate post am PT psy meds 3/22 Decreased mentation and movement of extremities overnight, sent for Head CT which revealed acute vs subacute bilateral MCA infarcts  3/23 Lying in bed seen spontaneously moving lower and right upper extremity, unable to follow any commands.  3/24 Hypoglycemic episodes overnight, Weaning well 10/5 today, tmax 101.5 Remains on precedex gtt 1.2, off fentanyl, ongoing intermittent agitation/ tachypnea better after disimpacted.   Consults:  ID Cards for TEE Neurology   Procedures:  ETT 3/10 >>  R Fem TLC (ER) 3/10   R IJ TLC 3/10  L IJ TLC 3/10 >> 3/17  RUE PICC 3/16 >>   Significant Diagnostic Tests:   CT brain 3/10 >> Normal head CT  EEG 3/10 >> Moderate to severe diffuse encephalopathy. No seizures  Urine drug screen 3/10 >> positive for THC, acetaminophen Q000111Q, salicylate <7, ETOH Q000111Q  ECHO 3/11 >> EF 20-25%, LV function severely diminished, Global Hypokinesis,LV hypertrophy, Grade 1 Diastolic Dysfunction, Elevated PASP, Dilated LA, MV regurgitation, small mobile density right coronary cusp  TEE 3/16 >> LVEF 55%, No LA/LAA thrombus or masses, Previously noted mass on the aortic valve is a node of Arancius.  Normal variant and no evidence of endocarditis.Trivial TR and trivial PR 3/23 MRI brain >>Acute nonhemorrhagic infarcts in the posterior MCA territory bilaterally. Otherwise no evidence of chronic ischemia. 3/23 MRA head >>No significant intracranial stenosis.  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 3/23 MRV head >> negative  Micro Data:  Influenza A/B 3/10 >> negative  COVID 3/10 >> negative  MRSA PCR 3/10 >> positive  BCx2 3/10 >> streptococcus pneumoniae >> S-rocephin BCx1 (aerobic only) 3/10 >> negative  Tracheal aspirate 3/11 >> MRSA BCx2 3/12 >> negative   Antimicrobials:  Vancomycin 3/10 >> 3/16 Unasyn 3/10 >> 3/16 Pen G 3/16 >>   Interim history/subjective:  Overnight had ventilator assynchrony with high peak pressures.   Objective   Blood pressure (!) 149/69, pulse 80, temperature (!) 100.9 F (38.3 C), temperature source Oral, resp. rate 19, height 5\' 3"  (1.6 m), weight 104.6 kg, SpO2 96 %.    Vent Mode: PRVC FiO2 (%):  [40 %-80 %] 40 %  Set Rate:  [22 bmp] 22 bmp Vt Set:  [420 mL] 420 mL PEEP:  [5 cmH20] 5 cmH20 Plateau Pressure:  [25 cmH20-26 cmH20] 26 cmH20   Intake/Output Summary (Last 24 hours) at 11/30/2019 [id number] Last data filed at 11/30/2019 0800 Gross per 24 hour  Intake 2619.27 ml  Output 2300 ml  Net 319.27 ml   Filed  Weights   11/25/19 2213 11/27/19 0500 11/28/19 0443  Weight: 103.2 kg 104.2 kg 104.6 kg   Examination: General:  Older female agitated on MV  HEENT: MM pink/moist, ETT at 20, OGT, pupils equal/reactive Neuro: Awake, not following commands. Withdraws to pain on the right side.  CV: rr, no murmur PULM:  Copious secretions.  GI: obese, soft, bs+, purwick, NT Extremities: warm/dry, generalized +1 edema, RUE PICC wnl  Skin: no rashes   Resolved Hospital Problem list   Rhabdomyolysis  Elevated INR Septic shock   Assessment & Plan:   Acute hypoxic/hypercapnic respiratory failure with ARDS from MRSA PNA and aspiration pneumonia. Hx of asthma. Plan: Full MV support w/daily PS trials on fentanyl and precedex. Plan is for tracheostomy - will discuss with family, tentatively Monday 3/29.  Will be prolonged ventilator wean.  VAP bundle Intermittent CXR/ ABG as needed Continue duonebs and prn albuterol    Acute bilateral large bilateral posterior MCA infarcts - 3/23  -TEE 3/16 with no sign of endocarditis  - MRI brain c/w b/l posterior MCA territory infarcts; MRA/ MRV neg Plan: Neurology following, appreciate assistance  Ongoing neuro exams  Hold ASA PT/ OT when able. Not currently cooperative enough or with stable vent.  MRSA and aspiration pneumonia, and Pneumococcal bacteremia -Completed ABx for MRSA HCAP 3/16 -ID s/o as of 3/20 Plan: Completed Penicillin on 3/23 Trend WBC/ fever curve   Hx of HIV Plan: Continue home Descovy and Tivicay   Acute systolic heart failure with sepsis induced CM. Hx of HTN, HLD. Plan: Holding home beta blocker, clonidine, norvasc, vasotec, and maxide on hold Continue crestor   Acute metabolic encephalopathy 2nd to sepsis, hypoxia and delirium  Hx of schizophrenia, seizures. Plan: RASS goal 0 to -1 continue higher dose precedex with fentanyl gtt to PRN  Not been able to wean precedex at all . Increase clonidine to help titrate off precedex.   Continue Cogentin, Buspar, Doxepin, Neurotin, and Zoloft  Delirium precautions  Acute Kidney Injury 2nd to ATN in setting of septic shock and hypoxia. Hypokalemia, Hypernatremia - baseline creatinine 0.78 from 06/29/19 Plan: Monitor K and Mag.  Strict I/Os/ trend renal function  Renal panel/ mag in am  May need additional diuresis.   Constipation. Plan: Continue bowel regiment - scheduled miralax, senokot, prn dulcolax  DM type II poorly controlled with hyperglycemia. - HbA1C 12.3 from 11/11/19 Plan: Increase lantus and SSI with TF today for hyperglycemia  [place] for trach. Possibly Monday 3/29  Best practice:  Diet: tube feeds DVT prophylaxis: SQ heparin GI prophylaxis: protonix Mobility: BR Code Status: full code  Family Communication: see family meeting documentation from 3/26. I followed up with [name] Leone Haven 3/28 to follow-up about tracheostomy.  She was under the impression that she would have more time to think about this decision.  I let her know that her mom's been in the ICU for 17 days, and followed up and reiterated that the best way to move forward to help take care of mom and give her the best chance to come off the ventilator would be to proceed  with tracheostomy.  I discussed that we would like to do this on Monday 3/29 but the daughter feels pushed.  She is given talk with her brothers tonight and I told her I would follow-up about this on Monday morning. Disposition: to ICU   Labs:   CMP Latest Ref Rng & Units 11/29/2019 11/29/2019 11/28/2019  Glucose 70 - 99 mg/dL - 117(H) 156(H)  BUN 6 - 20 mg/dL - 38(H) 36(H)  Creatinine 0.44 - 1.00 mg/dL - 1.60(H) 1.67(H)  Sodium 135 - 145 mmol/L 149(H) 150(H) 150(H)  Potassium 3.5 - 5.1 mmol/L 3.8 3.6 4.0  Chloride 98 - 111 mmol/L - 108 108  CO2 22 - 32 mmol/L - 34(H) 32  Calcium 8.9 - 10.3 mg/dL - 8.1(L) 8.1(L)  Total Protein 6.5 - 8.1 g/dL - - -  Total Bilirubin 0.3 - 1.2 mg/dL - - -    Alkaline Phos 38 - 126 U/L - - -  AST 15 - 41 U/L - - -  ALT 0 - 44 U/L - - -    CBC Latest Ref Rng & Units 11/29/2019 11/29/2019 11/28/2019  WBC 4.0 - 10.5 K/uL - 10.2 12.8(H)  Hemoglobin 12.0 - 15.0 g/dL 7.8(L) 8.4(L) 8.8(L)  Hematocrit 36.0 - 46.0 % 23.0(L) 29.0(L) 28.9(L)  Platelets 150 - 400 K/uL - 570(H) 556(H)    ABG    Component Value Date/Time   PHART 7.517 (H) 11/29/2019 0548   PCO2ART 43.3 11/29/2019 0548   PO2ART 46.0 (L) 11/29/2019 0548   HCO3 35.1 (H) 11/29/2019 0548   TCO2 36 (H) 11/29/2019 0548   ACIDBASEDEF 4.0 (H) 11/12/2019 1504   O2SAT 85.0 11/29/2019 0548    CBG (last 3)  Recent Labs    11/29/19 2357 11/30/19 0418 11/30/19 0742  GLUCAP 280* 242* 338*    CRITICAL CARE The patient is critically ill with multiple organ systems failure and requires high complexity decision making for assessment and support, frequent evaluation and titration of therapies, application of advanced monitoring technologies and extensive interpretation of multiple databases.   Critical Care Time devoted to patient care services described in this note is 35 minutes. This time reflects time of care of this [place] . This critical care time does not reflect separately billable procedures or procedure time, teaching time or supervisory time of PA/NP/Med student/Med Resident etc but could involve care discussion time.  Leone Haven [facility] 11/30/2019 9:28 AM  Pager: [phone] After hours pager: [phone]

## 2019-11-30 NOTE — Progress Notes (Signed)
SLP Cancellation Note  Patient Details Name: Susan [name] MRN: [id number] DOB: 08/29/1961   Cancelled treatment:       Reason Eval/Treat Not Completed: Other (comment) Patient with new tracheostomy today. Orders for SLP eval and treat for PMSV and swallowing received. Will follow pt closely for readiness for SLP interventions as appropriate.      Osie Bond., M.A. [facility] Pager [phone] Office [phone]  11/30/2019, 3:12 PM

## 2019-11-30 NOTE — Progress Notes (Signed)
Placement of the tracheostomy verified with bronchoscope by Dr. Tamala Julian.

## 2019-11-30 NOTE — Procedures (Signed)
Cortrak  Tube Type:  Cortrak - 43 inches Tube Location:  Left nare Initial Placement:  Stomach Secured by: Bridle Technique Used to Measure Tube Placement:  Documented cm marking at nare/ corner of mouth Cortrak Secured At:  65 cm    Cortrak Tube Team Note:  Consult received to place a Cortrak feeding tube.   No x-ray is required. RN may begin using tube.   If the tube becomes dislodged please keep the tube and contact the Cortrak team at [email or web address] ([email or web address]) for replacement.  If after hours and replacement cannot be delayed, place a NG tube and confirm placement with an abdominal x-ray.    Koleen Distance MS, RD, LDN Contact information available in Amion

## 2019-12-01 DIAGNOSIS — Z930 Tracheostomy status: Secondary | ICD-10-CM

## 2019-12-01 DIAGNOSIS — G9341 Metabolic encephalopathy: Secondary | ICD-10-CM | POA: Diagnosis not present

## 2019-12-01 DIAGNOSIS — J9601 Acute respiratory failure with hypoxia: Secondary | ICD-10-CM | POA: Diagnosis not present

## 2019-12-01 LAB — CULTURE, BLOOD (ROUTINE X 2)
Culture: NO GROWTH
Culture: NO GROWTH
Special Requests: ADEQUATE — AB

## 2019-12-01 LAB — BASIC METABOLIC PANEL
Anion gap: 11 (ref 5–15)
BUN: 49 mg/dL — ABNORMAL HIGH (ref 6–20)
CO2: 31 mmol/L (ref 22–32)
Calcium: 8.7 mg/dL — ABNORMAL LOW (ref 8.9–10.3)
Chloride: 104 mmol/L (ref 98–111)
Creatinine, Ser: 1.45 mg/dL — ABNORMAL HIGH (ref 0.44–1.00)
GFR calc Af Amer: 46 mL/min — ABNORMAL LOW (ref >60)
GFR calc non Af Amer: 40 mL/min — ABNORMAL LOW (ref >60)
Glucose, Bld: 243 mg/dL — ABNORMAL HIGH (ref 70–99)
Potassium: 3.9 mmol/L (ref 3.5–5.1)
Sodium: 146 mmol/L — ABNORMAL HIGH (ref 135–145)

## 2019-12-01 LAB — CBC
HCT: 27.4 % — ABNORMAL LOW (ref 36.0–46.0)
Hemoglobin: 8.1 g/dL — ABNORMAL LOW (ref 12.0–15.0)
MCH: 27.3 pg (ref 26.0–34.0)
MCHC: 29.6 g/dL — ABNORMAL LOW (ref 30.0–36.0)
MCV: 92.3 fL (ref 80.0–100.0)
Platelets: 501 10*3/uL — ABNORMAL HIGH (ref 150–400)
RBC: 2.97 MIL/uL — ABNORMAL LOW (ref 3.87–5.11)
RDW: 17.5 % — ABNORMAL HIGH (ref 11.5–15.5)
WBC: 11.9 10*3/uL — ABNORMAL HIGH (ref 4.0–10.5)
nRBC: 0 % (ref 0.0–0.2)

## 2019-12-01 LAB — GLUCOSE, CAPILLARY
Glucose-Capillary: 230 mg/dL — ABNORMAL HIGH (ref 70–99)
Glucose-Capillary: 245 mg/dL — ABNORMAL HIGH (ref 70–99)
Glucose-Capillary: 172 mg/dL — ABNORMAL HIGH (ref 70–99)
Glucose-Capillary: 137 mg/dL — ABNORMAL HIGH (ref 70–99)
Glucose-Capillary: 141 mg/dL — ABNORMAL HIGH (ref 70–99)

## 2019-12-01 LAB — T-HELPER CELLS (CD4) COUNT (NOT AT ARMC)
CD4 % Helper T Cell: 24 % — ABNORMAL LOW (ref 33–65)
CD4 T Cell Abs: 440 /uL (ref 400–1790)

## 2019-12-01 NOTE — Progress Notes (Signed)
Nutrition Follow-up  **RD working remotely**  DOCUMENTATION CODES:   Morbid obesity  INTERVENTION:  Continue TF via Cortrak:  Vital High Protein at 60 ml/h (1440 ml per day).  Provides 1440 kcal, 126 gm protein, 1204 ml free water daily.   NUTRITION DIAGNOSIS:   Inadequate oral intake related to inability to eat as evidenced by NPO status.  Ongoing.  GOAL:   Provide needs based on ASPEN/SCCM guidelines  Met with TF  MONITOR:   Vent status, TF tolerance, I & O's, Labs  REASON FOR ASSESSMENT:   Ventilator, Consult Enteral/tube feeding initiation and management  ASSESSMENT:   59 yo Susan [name] admitted S/P fall at home, unresponsive, hypotensive. Intubated by EMS. Suspected overdose with toxic/metabolic encephalopathy and aspiration PNA. PMH includes schizophrenia, HIV, DM, neuropathy, HTN.  3/22 - CT head positive for stroke 3/23 - MRI revealed bilateral acute infarcts in the posterior MCA territory  3/28 - bronchoscopy 3/29 - trach; Cortrak placed (gastric)  Per MD, pt will have a prolonged ventilator wean.  Patient is currently intubated on ventilator support MV: 8.3 L/min Temp (24hrs), Avg:99.1 F (37.3 C), Min:98.6 F (37 C), Max:100.1 F (37.8 C)  Receiving TF via Cortrak: Vital High Protein @ 105m/hr, 2032mfree water Q4H  Labs reviewed: Na 146 (H) CBGs  227-279  Medications reviewed and include: Novolog, Lantus, Solu-medrol, Miralax, Senokot Drips: Fentanyl  Weight has fluctuated since admission. Currently 106.5 kg  I/O: +20,512.12m60m  Diet Order:   Diet Order    None      EDUCATION NEEDS:   Not appropriate for education at this time  Skin:  Skin Assessment: Skin Integrity Issues: Skin Integrity Issues:: Other (Comment) Other: non-pressure wound buttocks  Last BM:  3/26  Height:   Ht Readings from Last 1 Encounters:  11/17/19 5' 3"  (1.6 m)    Weight:   Wt Readings from Last 1 Encounters:  12/01/19 106.5 kg    Ideal Body  Weight:  52.3 kg  BMI:  Body mass index is 41.59 kg/m.  Estimated Nutritional Needs:   Kcal:  [id number]:  105-130 gm  Fluid:  >/= 1.6 L   AmaLarkin InaS, RD, LDN RD pager number and weekend/on-call pager number located in [place]

## 2019-12-01 NOTE — Progress Notes (Addendum)
NAME:  Susan [name], MRN:  [id number], DOB:  [birth year], LOS: 74 ADMISSION DATE:  11/11/2019, CONSULTATION DATE:  3/10 REFERRING MD:  Sabra Heck, CHIEF COMPLAINT:  Acute encephalopathy and respiratory failure    Brief History   59 year old female smoker admitted 3/10 with overdose complicated by aspiration PNA with MRSA,  Pneumococcal bacteremia, toxic metabolic encephalopathy and septic shock.    Past Medical History  Frequent falls at home,Tobacco abuse, diabetes with painful polyneuropathy, bipolar disease, GERD, HIV, schizophrenia, history of IV drug abuse, seizure disorder.   Significant Hospital Events   3/10 Admit with overdose, intubated on arrival, in shock   3/15 Ventilator asynchrony, heavy sedation & intermittent paralysis   3/16 Lasix drip.  Awaiting TEE.  Sputum w/ MRSA.   Coming back down on sedation, changed to [facility]. TEE showing NO EVIDENCE of endocarditis. ABX changed to pen G per ID. MRSA therapy complete. EF improved. Now up to 50% from 20-25% 3/17 Heavily sedated. Having episodes of bradycardia. Still massively overloaded (>14L), increasing lasix gtt to 8 mg from 4, adding metolazone.  3/19 Lasix to 40 every 12, changing RASS goal to -2, decrease in fentanyl, decreasing PEEP, anticipating trialing pressure support ventilation.  Watching upward trend of white blood cell count and low-grade fever 3/20 BM early am. Agitation early am > sedate post am PT psy meds 3/22 Decreased mentation and movement of extremities overnight, sent for Head CT which revealed acute vs subacute bilateral MCA infarcts  3/23 Lying in bed seen spontaneously moving lower and right upper extremity, unable to follow any commands.  3/24 Hypoglycemic episodes overnight, Weaning well 10/5 today, tmax 101.5 Remains on precedex gtt 1.2 3/29 tracheostomy placed  Consults:  ID Cards for TEE Neurology   Procedures:  ETT 3/10 >>  R Fem TLC (ER) 3/10  R IJ TLC 3/10  L IJ TLC 3/10 >> 3/17  RUE PICC 3/16 >>    Significant Diagnostic Tests:   CT brain 3/10 >> Normal head CT  EEG 3/10 >> Moderate to severe diffuse encephalopathy. No seizures  Urine drug screen 3/10 >> positive for THC, acetaminophen Q000111Q, salicylate <7, ETOH Q000111Q  ECHO 3/11 >> EF 20-25%, LV function severely diminished, Global Hypokinesis,LV hypertrophy, Grade 1 Diastolic Dysfunction, Elevated PASP, Dilated LA, MV regurgitation, small mobile density right coronary cusp  TEE 3/16 >> LVEF 55%, No LA/LAA thrombus or masses, Previously noted mass on the aortic valve is a node of Arancius.  Normal variant and no evidence of endocarditis.Trivial TR and trivial PR 3/23 MRI brain >>Acute nonhemorrhagic infarcts in the posterior MCA territory bilaterally. Otherwise no evidence of chronic ischemia. 3/23 MRA head >>No significant intracranial stenosis.  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 3/23 MRV head >> negative  Micro Data:  Influenza A/B 3/10 >> negative  COVID 3/10 >> negative  MRSA PCR 3/10 >> positive  BCx2 3/10 >> streptococcus pneumoniae >> S-rocephin BCx1 (aerobic only) 3/10 >> negative  Tracheal aspirate 3/11 >> MRSA BCx2 3/12 >> negative   Antimicrobials:  Vancomycin 3/10 >> 3/16 Unasyn 3/10 >> 3/16 Pen G 3/16 >> 3/23  Interim history/subjective:  Trach placed yesterday. Weaning well on 5/5 this morning. Sedation slowly coming off.   Objective   Blood pressure (!) 147/68, pulse 61, temperature 100.1 F (37.8 C), temperature source Axillary, resp. rate 18, height 5\' 3"  (1.6 m), weight 106.5 kg, SpO2 96 %.    Vent Mode: CPAP;PSV FiO2 (%):  [40 %] 40 % Set Rate:  [22  bmp] 22 bmp Vt Set:  [420 mL] 420 mL PEEP:  [5 cmH20] 5 cmH20 Pressure Support:  [5 cmH20] 5 cmH20 Plateau Pressure:  [9 cmH20-25 cmH20] 9 cmH20   Intake/Output Summary (Last 24 hours) at 12/01/2019 0901 Last data filed at 12/01/2019 0800 Gross per 24 hour  Intake 2399.4 ml  Output 2450 ml  Net -50.6 ml   Filed  Weights   11/27/19 0500 11/28/19 0443 12/01/19 0500  Weight: 104.2 kg 104.6 kg 106.5 kg   Examination:  General:  Obese female on vent via trach.  HEENT: [place]/AT, PERRL, no JVD. Trach in place.  Neuro: Awake, not following commands. Moves all extremities to pain.  CV: RRR, no MRG PULM:  Clear bilateral breath sounds.  GI: obese, soft, bs+, purwick, NT Extremities: warm/dry, generalized +1 edema, RUE PICC wnl  Skin: grossly intact. No rash.  Resolved Hospital Problem list   Rhabdomyolysis Elevated INR Septic shock MRSA and aspiration pneumonia, and Pneumococcal bacteremia (ABX competed 3/23)  Assessment & Plan:   Acute hypoxic/hypercapnic respiratory failure with ARDS from MRSA PNA and aspiration pneumonia. Hx of asthma. Trach placed 3/29 Plan: Push SBT Wean sedation to off if able.  Hopeful she will be able to tolerate trach collar very soon, perhaps even later today.  Continue duonebs and prn albuterol    Acute bilateral large bilateral posterior MCA infarcts - 3/23  -TEE 3/16 with no sign of endocarditis  - MRI brain c/w b/l posterior MCA territory infarcts; MRA/ MRV neg Plan: Neurology signed off 3/25 ASA being held for possible GIB and dropping Hgb PT/ OT when able. Not currently cooperative enough or with stable vent.  Hx of HIV Plan: Continue home Descovy and Tivicay   Acute systolic heart failure with sepsis induced CM. Hx of HTN, HLD. Plan: Holding home beta blocker, clonidine, norvasc, vasotec, and maxide on hold with borderline BP Continue crestor   Acute metabolic encephalopathy 2nd to sepsis, hypoxia and delirium  Hx of schizophrenia, seizures. Plan: Plan to quickly wean sedation now that trach is in. (Precedex, fentanyl) Continue Cogentin, Buspar, Doxepin, Neurotin, and Zoloft  Delirium precautions.   Acute Kidney Injury 2nd to ATN in setting of septic shock and hypoxia. (improved) Hypokalemia, Hypernatremia - baseline creatinine 0.78 from  06/29/19 Plan: Monitor K and Mag.  Strict I/Os/ trend renal function  Renal panel/ mag in am  May need additional diuresis.   Constipation. Plan: Continue bowel regimen - scheduled miralax, senokot, prn dulcolax  DM type II poorly controlled with hyperglycemia. - HbA1C 12.3 from 11/11/19 Plan: Increase lantus and SSI with TF today for hyperglycemia DC steroids  GOC  Full code.   Best practice:  Diet: tube feeds DVT prophylaxis: SQ heparin GI prophylaxis: protonix Mobility: BR Code Status: full code  Family Communication: Updated daughter via phone. She is happy to hear Mom is weaning well from the vent. She seems surprised to hear that the stoke is possibly quite severe and there will be a very slow and likely limited recovery, which would include at the very least short term and possibly long-term/life-long SNF residency.  Disposition: to ICU   Labs:   CMP Latest Ref Rng & Units 12/01/2019 11/29/2019 11/29/2019  Glucose 70 - 99 mg/dL 243(H) - 117(H)  BUN 6 - 20 mg/dL 49(H) - 38(H)  Creatinine 0.44 - 1.00 mg/dL 1.45(H) - 1.60(H)  Sodium 135 - 145 mmol/L 146(H) 149(H) 150(H)  Potassium 3.5 - 5.1 mmol/L 3.9 3.8 3.6  Chloride 98 - 111 mmol/L  104 - 108  CO2 22 - 32 mmol/L 31 - 34(H)  Calcium 8.9 - 10.3 mg/dL 8.7(L) - 8.1(L)  Total Protein 6.5 - 8.1 g/dL - - -  Total Bilirubin 0.3 - 1.2 mg/dL - - -  Alkaline Phos 38 - 126 U/L - - -  AST 15 - 41 U/L - - -  ALT 0 - 44 U/L - - -    CBC Latest Ref Rng & Units 12/01/2019 11/29/2019 11/29/2019  WBC 4.0 - 10.5 K/uL 11.9(H) - 10.2  Hemoglobin 12.0 - 15.0 g/dL 8.1(L) 7.8(L) 8.4(L)  Hematocrit 36.0 - 46.0 % 27.4(L) 23.0(L) 29.0(L)  Platelets 150 - 400 K/uL 501(H) - 570(H)    ABG    Component Value Date/Time   PHART 7.517 (H) 11/29/2019 0548   PCO2ART 43.3 11/29/2019 0548   PO2ART 46.0 (L) 11/29/2019 0548   HCO3 35.1 (H) 11/29/2019 0548   TCO2 36 (H) 11/29/2019 0548   ACIDBASEDEF 4.0 (H) 11/12/2019 1504   O2SAT 85.0 11/29/2019  0548    CBG (last 3)  Recent Labs    11/30/19 2340 12/01/19 0353 12/01/19 0803  GLUCAP 239* 230* 245*   Critical care time 35 mins.   Georgann Housekeeper, AGACNP-BC [place]  See Amion for personal pager PCCM on call pager [phone]  12/01/2019 9:07 AM

## 2019-12-01 NOTE — Evaluation (Signed)
Physical Therapy Evaluation Patient Details Name: Susan [name] MRN: [id number] DOB: 1961-06-10 Today's Date: 12/01/2019   History of Present Illness  59 year old female smoker admitted 3/10 with overdose complicated by aspiration PNA with MRSA,  Pneumococcal bacteremia, toxic metabolic encephalopathy and septic shock.  Frequent falls at home,Tobacco abuse, diabetes with painful polyneuropathy, bipolar disease, GERD, HIV, schizophrenia, history of IV drug abuse, seizure disorder. Intubated 3/10-3/29 3/23 MRI brain >>Acute nonhemorrhagic infarcts in the posterior MCA territory bilaterally. Tracheostomy placed 3/29  Clinical Impression  Pt wide awake and responds to people in the room with expressive facial expressions, but does not respond to her name, or follow any commands. Placed her bed in to chair position and pt able to move R UE and bilateral LE spontaneously with good strength, with almost constant movement of her LE bouncing them on the bed. Pt L UE is flaccid. Pt has disconjugate gaze and is preferential to the R, she does not respond to threat to L eye, thought to have L side neglect but then occasionally turns head to the L and mirrors smiling at person on her L. Turned on [employer] music and pt able to move to the beat.  PT recommending SNF level rehab at discharge. PT will continue to follow acutely.     Follow Up Recommendations SNF    Equipment Recommendations  Other (comment)(TBD at next venue)    Recommendations for Other Services OT consult     Precautions / Restrictions Precautions Precautions: Fall Precaution Comments: hx of falls at home  Restrictions Weight Bearing Restrictions: No      Mobility  Bed Mobility               General bed mobility comments: placed in chair position for evaluation, did not attempt due to no command follow   Transfers                    Ambulation/Gait                    Modified Rankin (Stroke Patients  Only) Modified Rankin (Stroke Patients Only) Pre-Morbid Rankin Score: No symptoms Modified Rankin: Severe disability           Pertinent Vitals/Pain Pain Assessment: Faces Faces Pain Scale: No hurt    Home Living Family/patient expects to be discharged to:: Unsure                       Extremity/Trunk Assessment   Upper Extremity Assessment Upper Extremity Assessment: Difficult to assess due to impaired cognition;RUE deficits/detail;LUE deficits/detail RUE Deficits / Details: reactionary movement of R UE to scratch nose, but no response to commands, PROM of shoulder tight above 90 degree, elbow and weist WFL  LUE Deficits / Details: no spontaneous movement, PROM WFL    Lower Extremity Assessment Lower Extremity Assessment: RLE deficits/detail;LLE deficits/detail RLE Deficits / Details: exhibits good strength with spontaneus movement of feet and ankles almost constantly, knee and hip PROM tight and lacking full range LLE Deficits / Details: exhibits good strength with spontaneus movement of feet and ankles almost constantly, knee and hip PROM tight and lacking full range    Cervical / Trunk Assessment Cervical / Trunk Assessment: (able to hold head and neck off bed in chair position )  Communication   Communication: Receptive difficulties;Expressive difficulties;Tracheostomy  Cognition Arousal/Alertness: Awake/alert Behavior During Therapy: Restless Overall Cognitive Status: Difficult to assess  General Comments: pt is wide awake, increased facilal expression with interaction, no ability for command follow, does not respond to her name       General Comments General comments (skin integrity, edema, etc.): BP in supine 159/70 with bed in chair position 169/60 after 5 minutes 145/67, seeming Lsided neglect wtih no response to threat to L side eye, decreased attention to L most of the time but ocassionally turns whole head  to Left         Assessment/Plan    PT Assessment Patient needs continued PT services  PT Problem List Decreased coordination;Decreased cognition;Decreased balance;Decreased knowledge of precautions;Decreased safety awareness;Decreased knowledge of use of DME;Decreased skin integrity       PT Treatment Interventions DME instruction;Gait training;Functional mobility training;Therapeutic activities;Therapeutic exercise;Balance training;Neuromuscular re-education;Cognitive remediation;Patient/family education    PT Goals (Current goals can be found in the Care Plan section)  Acute Rehab PT Goals Patient Stated Goal: non verbal  PT Goal Formulation: Patient unable to participate in goal setting Time For Goal Achievement: 12/15/19 Potential to Achieve Goals: Fair    Frequency Min 3X/week    AM-PAC PT "6 Clicks" Mobility  Outcome Measure Help needed turning from your back to your side while in a flat bed without using bedrails?: Total Help needed moving from lying on your back to sitting on the side of a flat bed without using bedrails?: Total Help needed moving to and from a bed to a chair (including a wheelchair)?: Total Help needed standing up from a chair using your arms (e.g., wheelchair or bedside chair)?: Total Help needed to walk in hospital room?: Total Help needed climbing 3-5 steps with a railing? : Total 6 Click Score: 6    End of Session   Activity Tolerance: Patient tolerated treatment well Patient left: in bed;with call bell/phone within reach;with bed alarm set Nurse Communication: Mobility status;Other (comment)(BP ) PT Visit Diagnosis: Unsteadiness on feet (R26.81);Other abnormalities of gait and mobility (R26.89);Repeated falls (R29.6);Muscle weakness (generalized) (M62.81);History of falling (Z91.81);Difficulty in walking, not elsewhere classified (R26.2);Hemiplegia and hemiparesis Hemiplegia - Right/Left: Left Hemiplegia - caused by: Cerebral infarction     Time: 1200-1221 PT Time Calculation (min) (ACUTE ONLY): 21 min   Charges:   PT Evaluation $PT Eval High Complexity: 1 High          Edee Nifong B. Migdalia Dk PT, DPT Acute Rehabilitation Services Pager [phone] Office [phone]   [place] 12/01/2019, 1:28 PM

## 2019-12-02 DIAGNOSIS — G9341 Metabolic encephalopathy: Secondary | ICD-10-CM | POA: Diagnosis not present

## 2019-12-02 DIAGNOSIS — J9601 Acute respiratory failure with hypoxia: Secondary | ICD-10-CM | POA: Diagnosis not present

## 2019-12-02 DIAGNOSIS — I63413 Cerebral infarction due to embolism of bilateral middle cerebral arteries: Secondary | ICD-10-CM | POA: Diagnosis not present

## 2019-12-02 LAB — CBC
HCT: 28.5 % — ABNORMAL LOW (ref 36.0–46.0)
Hemoglobin: 8.5 g/dL — ABNORMAL LOW (ref 12.0–15.0)
MCH: 27.6 pg (ref 26.0–34.0)
MCHC: 29.8 g/dL — ABNORMAL LOW (ref 30.0–36.0)
MCV: 92.5 fL (ref 80.0–100.0)
Platelets: 506 10*3/uL — ABNORMAL HIGH (ref 150–400)
RBC: 3.08 MIL/uL — ABNORMAL LOW (ref 3.87–5.11)
RDW: 17.8 % — ABNORMAL HIGH (ref 11.5–15.5)
WBC: 12.1 10*3/uL — ABNORMAL HIGH (ref 4.0–10.5)
nRBC: 0 % (ref 0.0–0.2)

## 2019-12-02 LAB — GLUCOSE, CAPILLARY
Glucose-Capillary: 117 mg/dL — ABNORMAL HIGH (ref 70–99)
Glucose-Capillary: 126 mg/dL — ABNORMAL HIGH (ref 70–99)
Glucose-Capillary: 134 mg/dL — ABNORMAL HIGH (ref 70–99)
Glucose-Capillary: 134 mg/dL — ABNORMAL HIGH (ref 70–99)
Glucose-Capillary: 140 mg/dL — ABNORMAL HIGH (ref 70–99)
Glucose-Capillary: 153 mg/dL — ABNORMAL HIGH (ref 70–99)
Glucose-Capillary: 97 mg/dL (ref 70–99)

## 2019-12-02 LAB — BASIC METABOLIC PANEL
Anion gap: 10 (ref 5–15)
BUN: 39 mg/dL — ABNORMAL HIGH (ref 6–20)
CO2: 32 mmol/L (ref 22–32)
Calcium: 8.5 mg/dL — ABNORMAL LOW (ref 8.9–10.3)
Chloride: 102 mmol/L (ref 98–111)
Creatinine, Ser: 1.17 mg/dL — ABNORMAL HIGH (ref 0.44–1.00)
GFR calc Af Amer: 60 mL/min — ABNORMAL LOW (ref >60)
GFR calc non Af Amer: 52 mL/min — ABNORMAL LOW (ref >60)
Glucose, Bld: 111 mg/dL — ABNORMAL HIGH (ref 70–99)
Potassium: 3 mmol/L — ABNORMAL LOW (ref 3.5–5.1)
Sodium: 144 mmol/L (ref 135–145)

## 2019-12-02 LAB — MAGNESIUM: Magnesium: 1.9 mg/dL (ref 1.7–2.4)

## 2019-12-02 LAB — PHOSPHORUS: Phosphorus: 2.5 mg/dL (ref 2.5–4.6)

## 2019-12-02 MED ORDER — FUROSEMIDE 10 MG/ML IJ SOLN
40.0000 mg | Freq: Once | INTRAMUSCULAR | Status: AC
  Administered 2019-12-02: 40 mg via INTRAVENOUS
  Filled 2019-12-02: qty 4

## 2019-12-02 MED ORDER — CLONIDINE HCL 0.2 MG PO TABS: 0.2000 mg | ORAL_TABLET | Freq: Four times a day (QID) | ORAL | Status: DC

## 2019-12-02 MED ORDER — CLONIDINE HCL 0.2 MG PO TABS: 0.2000 mg | ORAL_TABLET | Freq: Two times a day (BID) | ORAL | Status: DC

## 2019-12-02 MED ORDER — CLONIDINE HCL 0.2 MG PO TABS: 0.2000 mg | ORAL_TABLET | Freq: Three times a day (TID) | ORAL | Status: DC

## 2019-12-02 MED ORDER — CLONIDINE HCL 0.3 MG PO TABS
0.3000 mg | ORAL_TABLET | Freq: Four times a day (QID) | ORAL | Status: AC
  Administered 2019-12-02 – 2019-12-03 (×4): 0.3 mg via ORAL
  Filled 2019-12-02 (×4): qty 1

## 2019-12-02 MED ORDER — POTASSIUM CHLORIDE 20 MEQ/15ML (10%) PO SOLN
30.0000 meq | ORAL | Status: AC
  Administered 2019-12-02 (×2): 30 meq
  Filled 2019-12-02 (×2): qty 30

## 2019-12-02 MED ORDER — POTASSIUM CHLORIDE 20 MEQ PO PACK
40.0000 meq | PACK | Freq: Once | ORAL | Status: AC
  Administered 2019-12-02: 40 meq
  Filled 2019-12-02: qty 2

## 2019-12-02 NOTE — Progress Notes (Signed)
NAME:  Susan [name], MRN:  [id number], DOB:  12-Aug-1961, LOS: 21 ADMISSION DATE:  11/11/2019, CONSULTATION DATE:  3/10 REFERRING MD:  Sabra Heck, CHIEF COMPLAINT:  Acute encephalopathy and respiratory failure    Brief History   59 year old female smoker admitted 3/10 with overdose complicated by aspiration PNA with MRSA,  Pneumococcal bacteremia, toxic metabolic encephalopathy and septic shock.    Past Medical History  Frequent falls at home,Tobacco abuse, diabetes with painful polyneuropathy, bipolar disease, GERD, HIV, schizophrenia, history of IV drug abuse, seizure disorder.   Significant Hospital Events   3/10 Admit with overdose, intubated on arrival, in shock   3/15 Ventilator asynchrony, heavy sedation & intermittent paralysis   3/16 Lasix drip.  Awaiting TEE.  Sputum w/ MRSA.   Coming back down on sedation, changed to [facility]. TEE showing NO EVIDENCE of endocarditis. ABX changed to pen G per ID. MRSA therapy complete. EF improved. Now up to 50% from 20-25% 3/17 Heavily sedated. Having episodes of bradycardia. Still massively overloaded (>14L), increasing lasix gtt to 8 mg from 4, adding metolazone.  3/19 Lasix to 40 every 12, changing RASS goal to -2, decrease in fentanyl, decreasing PEEP, anticipating trialing pressure support ventilation.  Watching upward trend of white blood cell count and low-grade fever 3/20 BM early am. Agitation early am > sedate post am PT psy meds 3/22 Decreased mentation and movement of extremities overnight, sent for Head CT which revealed acute vs subacute bilateral MCA infarcts  3/23 Lying in bed seen spontaneously moving lower and right upper extremity, unable to follow any commands.  3/24 Hypoglycemic episodes overnight, Weaning well 10/5 today, tmax 101.5 Remains on precedex gtt 1.2 3/29 tracheostomy placed  Consults:  ID Cards for TEE Neurology   Procedures:  ETT 3/10 >>  R Fem TLC (ER) 3/10  R IJ TLC 3/10  L IJ TLC 3/10 >> 3/17  RUE PICC 3/16 >>    Significant Diagnostic Tests:   CT brain 3/10 >> Normal head CT  EEG 3/10 >> Moderate to severe diffuse encephalopathy. No seizures  Urine drug screen 3/10 >> positive for THC, acetaminophen Q000111Q, salicylate <7, ETOH Q000111Q  ECHO 3/11 >> EF 20-25%, LV function severely diminished, Global Hypokinesis,LV hypertrophy, Grade 1 Diastolic Dysfunction, Elevated PASP, Dilated LA, MV regurgitation, small mobile density right coronary cusp  TEE 3/16 >> LVEF 55%, No LA/LAA thrombus or masses, Previously noted mass on the aortic valve is a node of Arancius.  Normal variant and no evidence of endocarditis.Trivial TR and trivial PR 3/23 MRI brain >>Acute nonhemorrhagic infarcts in the posterior MCA territory bilaterally. Otherwise no evidence of chronic ischemia. 3/23 MRA head >>No significant intracranial stenosis.  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 3/23 MRV head >> negative  Micro Data:  Influenza A/B 3/10 >> negative  COVID 3/10 >> negative  MRSA PCR 3/10 >> positive  BCx2 3/10 >> streptococcus pneumoniae >> S-rocephin BCx1 (aerobic only) 3/10 >> negative  Tracheal aspirate 3/11 >> MRSA BCx2 3/12 >> negative   Antimicrobials:  Vancomycin 3/10 >> 3/16 Unasyn 3/10 >> 3/16 Pen G 3/16 >> 3/23  Interim history/subjective:  Up to chair today via [employer] on MV Potassium replaced this morning  Objective   Blood pressure (!) 162/81, pulse 83, temperature 99.7 F (37.6 C), temperature source Oral, resp. rate 20, height 5\' 3"  (1.6 m), weight 106 kg, SpO2 97 %.    Vent Mode: PRVC FiO2 (%):  [40 %] 40 % Set Rate:  [22  bmp] 22 bmp Vt Set:  [420 mL] 420 mL PEEP:  [5 cmH20] 5 cmH20 Pressure Support:  [5 cmH20] 5 cmH20 Plateau Pressure:  [16 cmH20-25 cmH20] 16 cmH20   Intake/Output Summary (Last 24 hours) at 12/02/2019 0842 Last data filed at 12/02/2019 0757 Gross per 24 hour  Intake 2821.02 ml  Output 2930 ml  Net -108.98 ml   Filed Weights    11/28/19 0443 12/01/19 0500 12/02/19 0500  Weight: 104.6 kg 106.5 kg 106 kg   Examination:  General: Obese chronically ill woman, currently up to chair HEENT: Trach in place, pupils equal, no oral secretions or lesions Neuro: Eyes are open, appears to intermittently track, aphasic, moves head purposefully, moves upper extremities, does not follow any commands CV: Regular, distant, tachycardic, no murmur PULM: Clear bilaterally, no wheeze GI: Obese nondistended, positive bowel sounds Extremities: 1-2+ pitting lower extremity edema Skin: No rash  Resolved Hospital Problem list   Rhabdomyolysis Elevated INR Septic shock MRSA and aspiration pneumonia, and Pneumococcal bacteremia (ABX competed 3/23)  Assessment & Plan:   Acute hypoxic/hypercapnic respiratory failure with ARDS from MRSA PNA and aspiration pneumonia. Hx of asthma. Trach placed 3/29 Plan: Goal decrease sedation as she can tolerate, push WUA and SBT.  Goal is for her to go to trach collar today. Continue DuoNeb and albuterol as needed VAP prevention order set Suctioning as needed   Acute bilateral large bilateral posterior MCA infarcts - 3/23  -TEE 3/16 with no sign of endocarditis  - MRI brain c/w b/l posterior MCA territory infarcts; MRA/ MRV neg Plan: Overall neurological prognosis is very guarded.  Remains aphasic and debilitated Neurology signed off 3/25, noted poor prognosis for meaningful recovery Continue PT OT as she can tolerate Aspirin 81 mg  Hx of HIV Plan: Continue home Descovy and Tivicay   Acute systolic heart failure with sepsis induced CM. Hx of HTN, HLD. Plan: Restarted clonidine 3/29 Off home amlodipine, Vasotec, beta-blocker, Maxide for now.  May need to add back depending on blood pressure. Continue Crestor  Acute metabolic encephalopathy 2nd to sepsis, hypoxia and delirium  Hx of schizophrenia, seizures. Plan: Continue with sedation to off Currently on Cogentin, BuSpar, doxepin,  Neurontin, Zoloft, Seroquel.  May begin to wean some of the Zoloft if her agitation improves  Acute Kidney Injury 2nd to ATN in setting of septic shock and hypoxia. (improved) Hypokalemia, Hypernatremia - baseline creatinine 0.78 from 06/29/19 Plan: Potassium replaced 3/31 Follow BMP, urine output and replace electrolytes as indicated Follow I/O, overall 20 L positive for the hospitalization.  May need intermittent diuresis; dose daily, Lasix given 3/31   Constipation. Plan: Continue bowel regimen - scheduled miralax, senokot, prn dulcolax  DM type II poorly controlled with hyperglycemia. - HbA1C 12.3 from 11/11/19 Plan: Lantus 22 units twice daily Sliding scale insulin with tube feed coverage  GOC  Full code.   Best practice:  Diet: tube feeds DVT prophylaxis: SQ heparin GI prophylaxis: protonix Mobility: BR Code Status: full code  Family Communication:  Left a message for daughter on 3/31. She was updated by Jaclynn Guarneri on 3/30 Disposition: to ICU   Labs:   CMP Latest Ref Rng & Units 12/02/2019 12/01/2019 11/29/2019  Glucose 70 - 99 mg/dL 111(H) 243(H) -  BUN 6 - 20 mg/dL 39(H) 49(H) -  Creatinine 0.44 - 1.00 mg/dL 1.17(H) 1.45(H) -  Sodium 135 - 145 mmol/L 144 146(H) 149(H)  Potassium 3.5 - 5.1 mmol/L 3.0(L) 3.9 3.8  Chloride 98 - 111 mmol/L 102 104 -  CO2 22 - 32 mmol/L 32 31 -  Calcium 8.9 - 10.3 mg/dL 8.5(L) 8.7(L) -  Total Protein 6.5 - 8.1 g/dL - - -  Total Bilirubin 0.3 - 1.2 mg/dL - - -  Alkaline Phos 38 - 126 U/L - - -  AST 15 - 41 U/L - - -  ALT 0 - 44 U/L - - -    CBC Latest Ref Rng & Units 12/02/2019 12/01/2019 11/29/2019  WBC 4.0 - 10.5 K/uL 12.1(H) 11.9(H) -  Hemoglobin 12.0 - 15.0 g/dL 8.5(L) 8.1(L) 7.8(L)  Hematocrit 36.0 - 46.0 % 28.5(L) 27.4(L) 23.0(L)  Platelets 150 - 400 K/uL 506(H) 501(H) -    ABG    Component Value Date/Time   PHART 7.517 (H) 11/29/2019 0548   PCO2ART 43.3 11/29/2019 0548   PO2ART 46.0 (L) 11/29/2019 0548   HCO3 35.1 (H)  11/29/2019 0548   TCO2 36 (H) 11/29/2019 0548   ACIDBASEDEF 4.0 (H) 11/12/2019 1504   O2SAT 85.0 11/29/2019 0548    CBG (last 3)  Recent Labs    12/02/19 0009 12/02/19 0442 12/02/19 0752  GLUCAP 140* 97 134*     Independent CC time 31 min   Baltazar Apo, MD, PhD 12/02/2019, 8:58 AM [facility] [phone] or if no answer [phone]

## 2019-12-02 NOTE — Progress Notes (Signed)
Pt placed back on full ventilator support for night time rest.  RT will proceed with ATC wean again in the am as tolerated.

## 2019-12-02 NOTE — TOC Progression Note (Signed)
Transition of Care [facility]) - Progression Note    Patient Details  Name: Susan [name] MRN: [id number] Date of Birth: [birth year]  Transition of Care [facility]) CM/SW Contact  Maryclare Labrador, RN Phone Number: 12/02/2019, 4:25 PM  Clinical Narrative:   Pt from home PTA.  CM following from afar as pt remains critically ill.  Pt is now 21 days LOS on vent.  Pt started TC this am - per attending pt will likely liberate from vent by the end of this week.  CM discussed LTACH consideration with attending group - group to consult CM if pt is deemed appropriate.        Expected Discharge Plan and Services                                                 Social Determinants of Health (SDOH) Interventions    Readmission Risk Interventions Readmission Risk Prevention Plan 02/19/2019  Transportation Screening Complete  Medication Review [employer]) Complete  PCP or Specialist appointment within 3-5 days of discharge Complete  HRI or [place] Complete  SW Recovery Care/Counseling Consult Complete  [place] Not Applicable  Some recent data might be hidden

## 2019-12-02 NOTE — Progress Notes (Signed)
[facility] ADULT ICU REPLACEMENT PROTOCOL FOR AM LAB REPLACEMENT ONLY  The patient does apply for the [facility] Adult ICU Electrolyte Replacment Protocol based on the criteria listed below:    1. Is GFR >/= 40 ml/min? Yes.    Patient's GFR today is 60 2. Is urine output >/= 0.5 ml/kg/hr for the last 6 hours? Yes.   Patient's UOP is 2.2 ml/kg/hr 3. Is BUN < 60 mg/dL? Yes.    Patient's BUN today is 39 4. Abnormal electrolyte(s): K 3.0 5. Ordered repletion with: protocol per tube 6. If a panic level lab has been reported, has the CCM MD in charge been notified? No..   Physician:    Ronda Fairly A 12/02/2019 6:13 AM

## 2019-12-02 NOTE — Evaluation (Signed)
Occupational Therapy Evaluation Patient Details Name: Susan [name] MRN: [id number] DOB: 07/12/1961 Today's Date: 12/02/2019    History of Present Illness 59 year old female smoker admitted 3/10 with overdose complicated by aspiration PNA with MRSA,  Pneumococcal bacteremia, toxic metabolic encephalopathy and septic shock.  Frequent falls at home,Tobacco abuse, diabetes with painful polyneuropathy, bipolar disease, GERD, HIV, schizophrenia, history of IV drug abuse, seizure disorder. Intubated 3/10-3/29 3/23 MRI brain >>Acute nonhemorrhagic infarcts in the posterior MCA territory bilaterally. Tracheostomy placed 3/29   Clinical Impression   Pt presents with above diagnoses, unsure of PLOF due to current mental status and ability to communicate/follow commands. Pt up in chair after ebing maxi skyed by [employer] staff. Pt washed faces with max A and hand over hand tactile cueing for initiation. Pt not consistently following any commands purposefully. She is very repetitive with back and forth head movements and RUE movements. LUE is flaccid. Pt also constantly kicking and readjusting BLEs. Provided deep proprioceptive input to stimulate sensory system with some relief. Pt also appears to have visual deficits with dysconjugate gaze and possible sensory involvement with neglect. Pt enjoys mot[place] music, moving some to the beat. Recommend SNF vs LTACH at baseline. Will continue to follow per POC listed below.    Follow Up Recommendations  SNF;LTACH;Supervision/Assistance - 24 hour    Equipment Recommendations  Other (comment)(TBD)    Recommendations for Other Services       Precautions / Restrictions Precautions Precautions: Fall Restrictions Weight Bearing Restrictions: No      Mobility Bed Mobility               General bed mobility comments: up in recliner from RN staff. Session performed seated in recliner, not following commands to progress mobility  Transfers                       Balance                                           ADL either performed or assessed with clinical judgement   ADL Overall ADL's : Needs assistance/impaired     Grooming: Maximal assistance;Cueing for sequencing;Cueing for compensatory techniques;Sitting Grooming Details (indicate cue type and reason): sitting in recliner, with RUE OT initiated movement to pt face, pt able to start moving wash cloth and face with max A. Very decreased attention and ability to sustain activity                               General ADL Comments: otheriwse total A for BADLs 2/2 cognitive status     Vision Patient Visual Report: Other (comment)(unable to report) Vision Assessment?: Vision impaired- to be further tested in functional context Additional Comments: dysconjugate gaze, difficulty maintaining any visual attention. Will need to continue to assess. Pt constantly moving head back and forth in rhythmic pattern     Perception     Praxis      Pertinent Vitals/Pain Pain Assessment: Faces Faces Pain Scale: No hurt     Hand Dominance     Extremity/Trunk Assessment Upper Extremity Assessment Upper Extremity Assessment: Difficult to assess due to impaired cognition;RUE deficits/detail;LUE deficits/detail RUE Deficits / Details: reactionary movement to music, tapping arm, not moving past 90 degrees LUE Deficits / Details: no spontaneous movement, PROM [facility]   Lower  Extremity Assessment Lower Extremity Assessment: Defer to PT evaluation       Communication Communication Communication: Receptive difficulties;Expressive difficulties;Tracheostomy   Cognition Arousal/Alertness: Awake/alert Behavior During Therapy: Restless Overall Cognitive Status: Difficult to assess                                 General Comments: wife awake, constantly rhythmically moving head side to side and moving extremities. Not responding to her name. was able to  follow partial face washing with max A after initiation   General Comments       Exercises     Shoulder Instructions      Home Living Family/patient expects to be discharged to:: Unsure                                 Additional Comments: not able to share any information, not following commands      Prior Functioning/Environment                   OT Problem List: Decreased strength;Impaired vision/perception;Decreased knowledge of use of DME or AE;Impaired tone;Decreased range of motion;Decreased coordination;Decreased knowledge of precautions;Obesity;Decreased activity tolerance;Decreased cognition;Cardiopulmonary status limiting activity;Impaired UE functional use;Impaired balance (sitting and/or standing);Decreased safety awareness;Impaired sensation      OT Treatment/Interventions: Self-care/ADL training;Therapeutic exercise;Visual/perceptual remediation/compensation;Patient/family education;Balance training;Neuromuscular education;Energy conservation;Therapeutic activities;DME and/or AE instruction;Cognitive remediation/compensation    OT Goals(Current goals can be found in the care plan section) Acute Rehab OT Goals OT Goal Formulation: Patient unable to participate in goal setting Time For Goal Achievement: 12/16/19 Potential to Achieve Goals: Fair  OT Frequency: Min 2X/week   Barriers to D/C:            Co-evaluation              AM-PAC OT "6 Clicks" Daily Activity     Outcome Measure Help from another person eating meals?: Total Help from another person taking care of personal grooming?: A Lot Help from another person toileting, which includes using toliet, bedpan, or urinal?: Total Help from another person bathing (including washing, rinsing, drying)?: Total Help from another person to put on and taking off regular upper body clothing?: Total Help from another person to put on and taking off regular lower body clothing?: Total 6 Click  Score: 7   End of Session Nurse Communication: Mobility status  Activity Tolerance: Treatment limited secondary to medical complications (Comment);Other (comment)(cognitive status) Patient left: in chair;with call bell/phone within reach  OT Visit Diagnosis: Unsteadiness on feet (R26.81);Other abnormalities of gait and mobility (R26.89);Other symptoms and signs involving cognitive function;Other symptoms and signs involving the nervous system (R29.898);Hemiplegia and hemiparesis Hemiplegia - Right/Left: Left Hemiplegia - dominant/non-dominant: (unsure) Hemiplegia - caused by: Cerebral infarction                Time: 1205-1220 OT Time Calculation (min): 15 min Charges:  OT General Charges $OT Visit: 1 Visit OT Evaluation $OT Eval High Complexity: 1 High  Zenovia Jarred, MSOT, OTR/L Acute Rehabilitation Services [facility] Office Number: [phone] Pager: [phone]  Zenovia Jarred 12/02/2019, 1:30 PM

## 2019-12-03 ENCOUNTER — Inpatient Hospital Stay (HOSPITAL_COMMUNITY): Payer: Medicare Other

## 2019-12-03 DIAGNOSIS — Z930 Tracheostomy status: Secondary | ICD-10-CM

## 2019-12-03 DIAGNOSIS — J9601 Acute respiratory failure with hypoxia: Secondary | ICD-10-CM | POA: Diagnosis not present

## 2019-12-03 DIAGNOSIS — G9341 Metabolic encephalopathy: Secondary | ICD-10-CM | POA: Diagnosis not present

## 2019-12-03 DIAGNOSIS — I63413 Cerebral infarction due to embolism of bilateral middle cerebral arteries: Secondary | ICD-10-CM | POA: Diagnosis not present

## 2019-12-03 LAB — CBC WITH DIFFERENTIAL/PLATELET
Abs Immature Granulocytes: 0.18 10*3/uL — ABNORMAL HIGH (ref 0.00–0.07)
Basophils Absolute: 0.1 10*3/uL (ref 0.0–0.1)
Basophils Relative: 1 %
Eosinophils Absolute: 0.9 10*3/uL — ABNORMAL HIGH (ref 0.0–0.5)
Eosinophils Relative: 7 %
HCT: 31.3 % — ABNORMAL LOW (ref 36.0–46.0)
Hemoglobin: 9.5 g/dL — ABNORMAL LOW (ref 12.0–15.0)
Immature Granulocytes: 1 %
Lymphocytes Relative: 25 %
Lymphs Abs: 3.2 10*3/uL (ref 0.7–4.0)
MCH: 27.3 pg (ref 26.0–34.0)
MCHC: 30.4 g/dL (ref 30.0–36.0)
MCV: 89.9 fL (ref 80.0–100.0)
Monocytes Absolute: 1.2 10*3/uL — ABNORMAL HIGH (ref 0.1–1.0)
Monocytes Relative: 9 %
Neutro Abs: 7.4 10*3/uL (ref 1.7–7.7)
Neutrophils Relative %: 57 %
Platelets: 511 10*3/uL — ABNORMAL HIGH (ref 150–400)
RBC: 3.48 MIL/uL — ABNORMAL LOW (ref 3.87–5.11)
RDW: 17.9 % — ABNORMAL HIGH (ref 11.5–15.5)
WBC: 12.9 10*3/uL — ABNORMAL HIGH (ref 4.0–10.5)
nRBC: 0.2 % (ref 0.0–0.2)

## 2019-12-03 LAB — BASIC METABOLIC PANEL
Anion gap: 11 (ref 5–15)
BUN: 34 mg/dL — ABNORMAL HIGH (ref 6–20)
CO2: 32 mmol/L (ref 22–32)
Calcium: 8.8 mg/dL — ABNORMAL LOW (ref 8.9–10.3)
Chloride: 102 mmol/L (ref 98–111)
Creatinine, Ser: 1.03 mg/dL — ABNORMAL HIGH (ref 0.44–1.00)
GFR calc Af Amer: >60 mL/min (ref >60)
GFR calc non Af Amer: >60 mL/min (ref >60)
Glucose, Bld: 77 mg/dL (ref 70–99)
Potassium: 3.3 mmol/L — ABNORMAL LOW (ref 3.5–5.1)
Sodium: 145 mmol/L (ref 135–145)

## 2019-12-03 LAB — GLUCOSE, CAPILLARY
Glucose-Capillary: 70 mg/dL (ref 70–99)
Glucose-Capillary: 162 mg/dL — ABNORMAL HIGH (ref 70–99)
Glucose-Capillary: 168 mg/dL — ABNORMAL HIGH (ref 70–99)
Glucose-Capillary: 136 mg/dL — ABNORMAL HIGH (ref 70–99)
Glucose-Capillary: 100 mg/dL — ABNORMAL HIGH (ref 70–99)

## 2019-12-03 IMAGING — CT CT ABDOMEN W/O CM
2 of 4 series · 14 of 46 positions shown, 16 images · non-contrast
Comparison: [DATE] CT chest, [DATE] CT abdomen

CLINICAL DATA: Dysphagia, preop planning for percutaneous
gastrostomy catheter

EXAM:
CT ABDOMEN WITHOUT CONTRAST
TECHNIQUE: Multidetector CT imaging of the abdomen was performed following the
standard protocol without IV contrast.

[Series 4: a/p w/o 5mm · axial · non-contrast · 0.90mm/px · z∈[+937,+1152]mm · 11 of 51 slices shown, 13 images]
[im 4/51  soft-tissue]
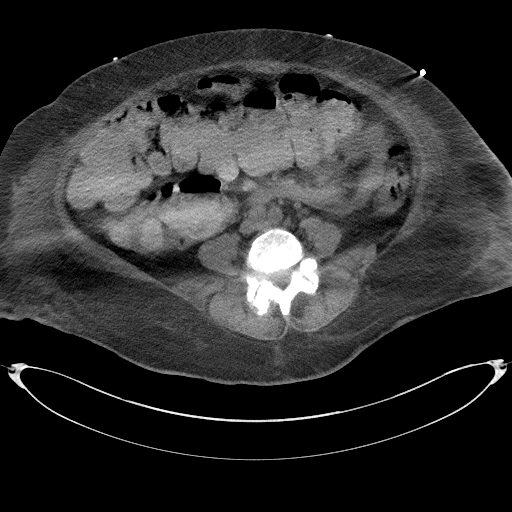
[im 4/51  bone]
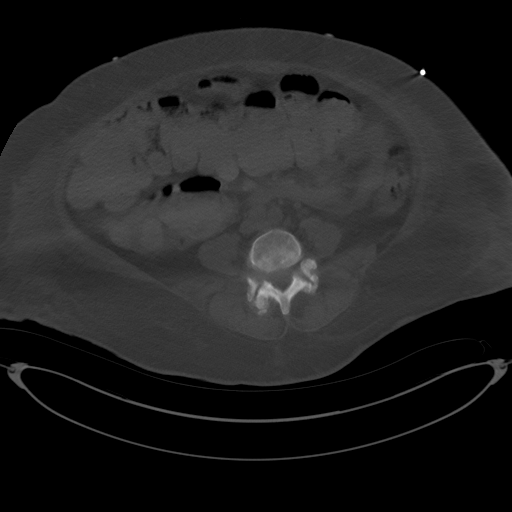
[im 7/51  soft-tissue]
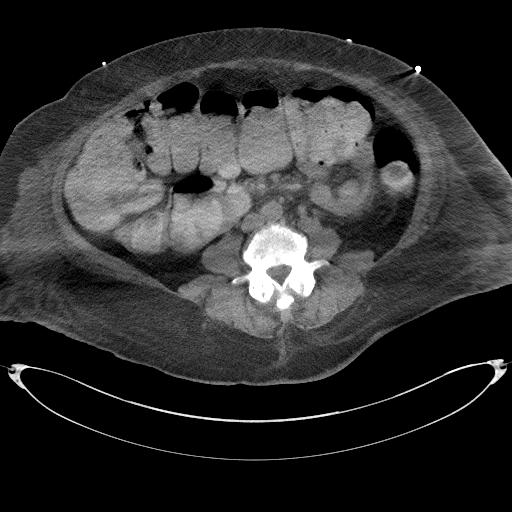
[im 14/51  soft-tissue]
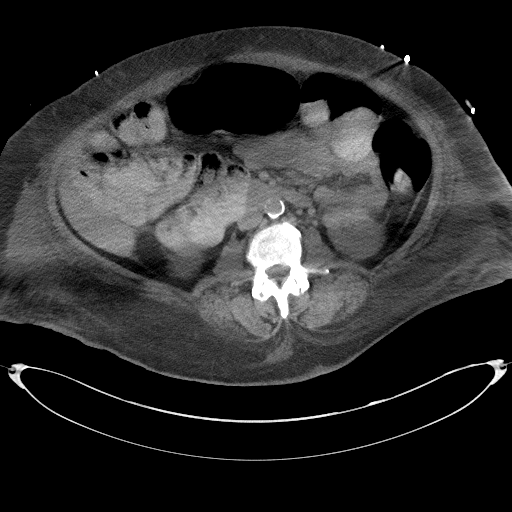
[im 17/51  soft-tissue]
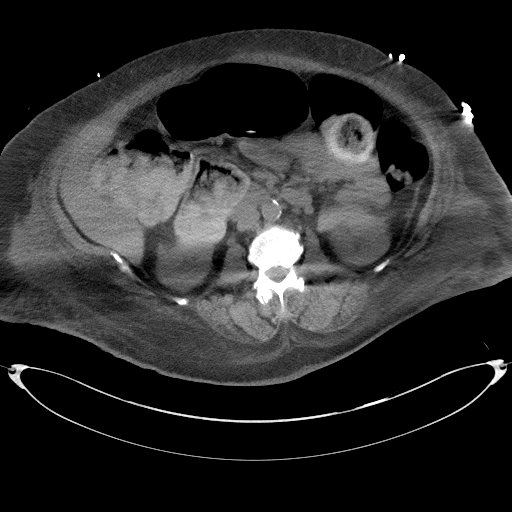
[im 21/51  soft-tissue]
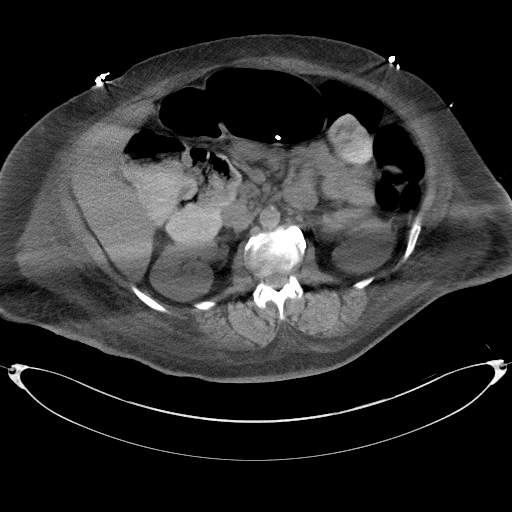
[im 27/51  soft-tissue]
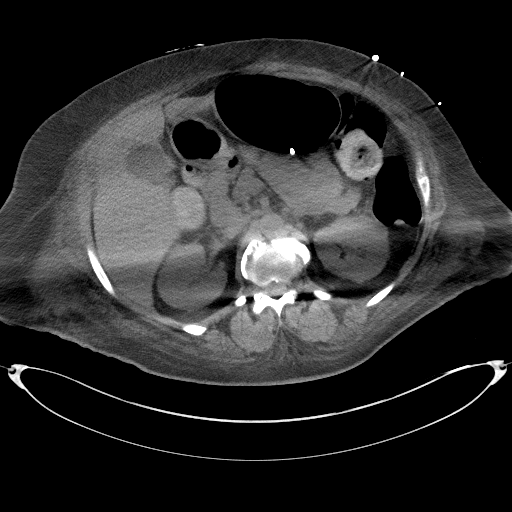
[im 31/51  soft-tissue]
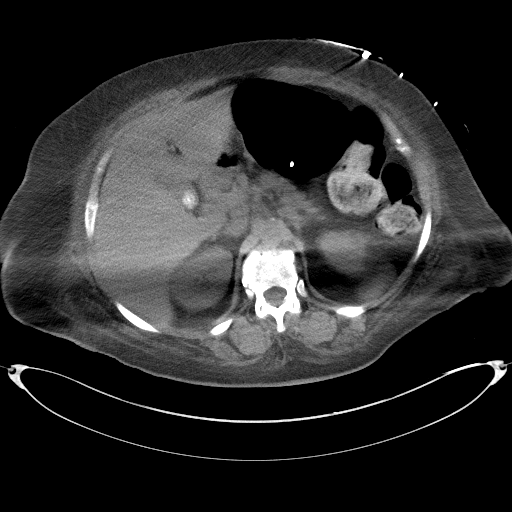
[im 34/51  soft-tissue]
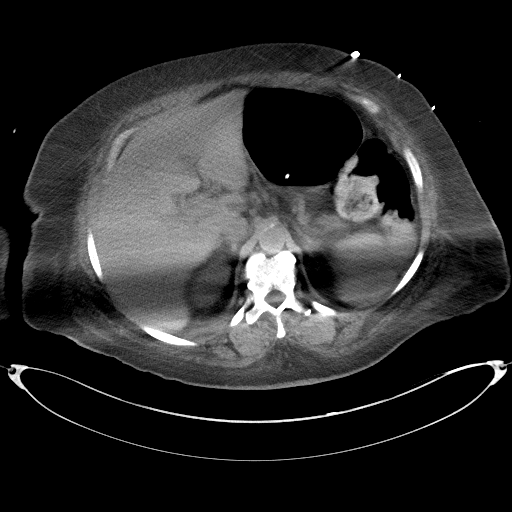
[im 37/51  soft-tissue]
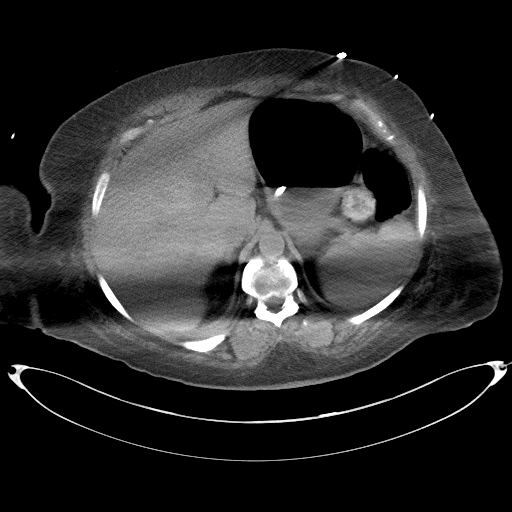
[im 37/51  bone]
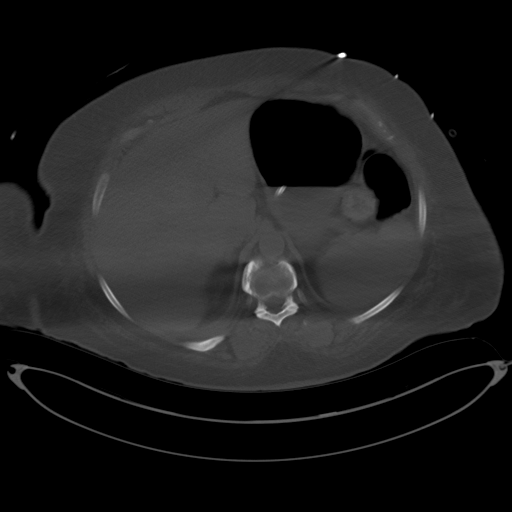
[im 44/51  soft-tissue]
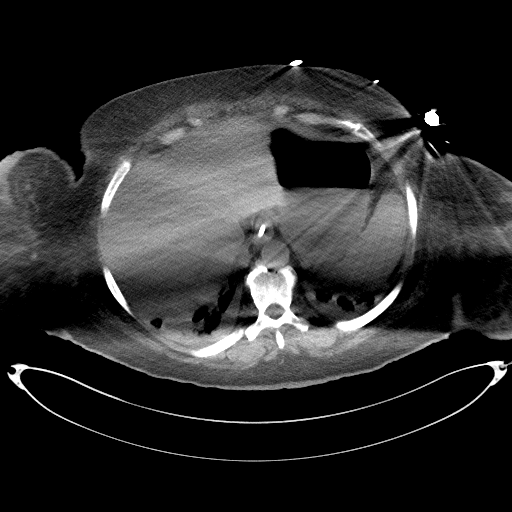
[im 47/51  soft-tissue]
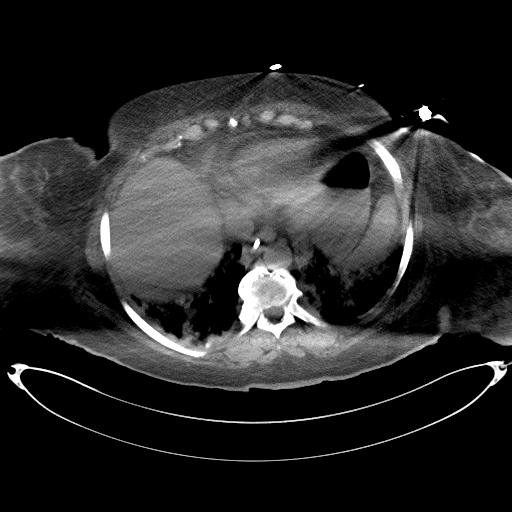

[Series 7: a/p w/o cor · coronal · non-contrast · 0.49mm/px · 3 of 163 slices shown]
[im 55/163  soft-tissue]
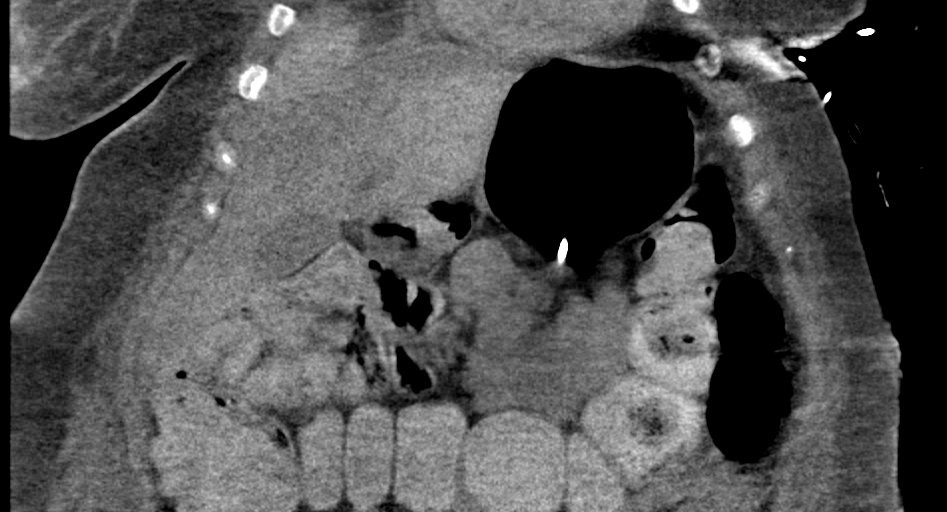
[im 73/163  soft-tissue]
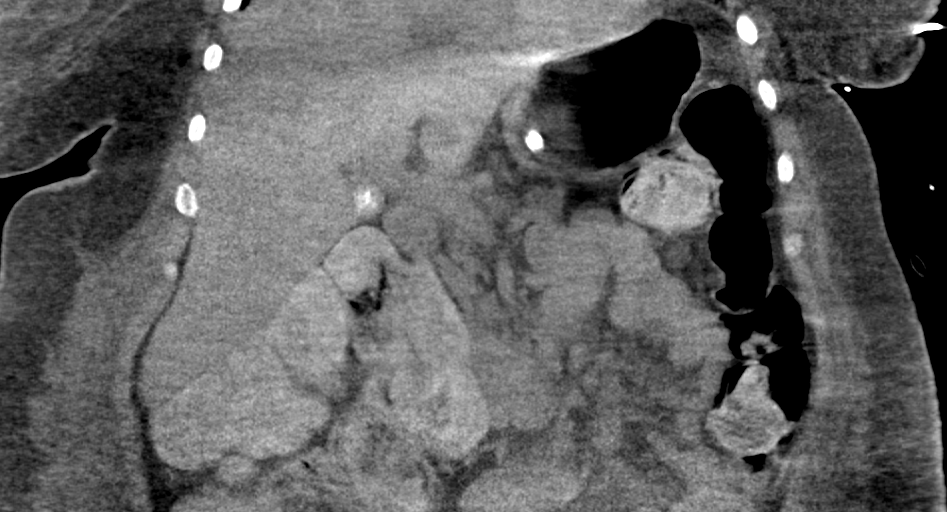
[im 91/163  soft-tissue]
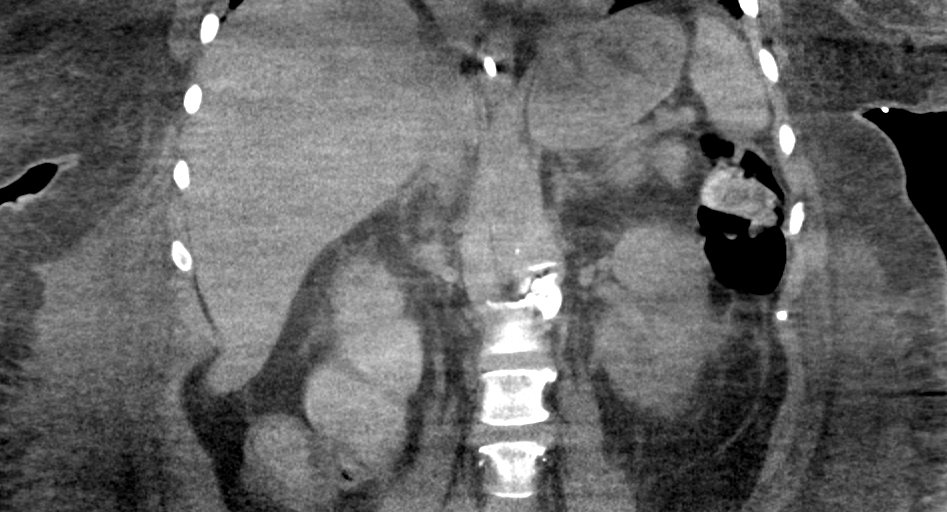

[14 of 46 positions shown; findings below may reference images not displayed]

FINDINGS: Lower chest: Moderate scattered airspace opacities throughout
visualized lung bases. No pleural or pericardial effusion.

Hepatobiliary: 1.5 cm partially calcified gallstone in the dependent
aspect of the nondilated gallbladder. Unremarkable noncontrast
evaluation of liver.

Pancreas: Mild diffuse atrophy. No mass or ductal dilatation
evident.

Spleen: Normal in size without focal abnormality.

Adrenals/Urinary Tract: Adrenals unremarkable. Kidneys unremarkable;
no hydronephrosis.

Stomach/Bowel: Feeding tube extends to the gastric antrum. Stomach
is physiologically distended. There is appropriate percutaneous
access for gastrostomy placement.

Visualized portions of small bowel are decompressed. Moderate fecal
material in the visualized colon which is nondilated. No focal wall
thickening identified.

Vascular/Lymphatic: Aortoiliac atherosclerosis ([NA]-170.0) without
aneurysm. Subcentimeter left para-aortic and aortocaval
retroperitoneal lymph nodes, more prominent than on [NA] study.

Other: No ascites. No free air.

Musculoskeletal: Spurring in the lower thoracic and lumbar spine.
Lower lumbar facet DJD. No fracture or worrisome bone lesion.
IMPRESSION: 1. There is appropriate anterior approach for percutaneous
gastrostomy placement.
2. Cholelithiasis.
3. Moderate scattered airspace opacities throughout visualized lung
bases. Correlate with any clinical or laboratory evidence of
pneumonia.

Aortic Atherosclerosis ([NA]-[NA]).

## 2019-12-03 MED ORDER — FUROSEMIDE 10 MG/ML IJ SOLN
40.0000 mg | Freq: Once | INTRAMUSCULAR | Status: AC
  Administered 2019-12-03: 40 mg via INTRAVENOUS
  Filled 2019-12-03: qty 4

## 2019-12-03 MED ORDER — AMLODIPINE BESYLATE 10 MG PO TABS
10.0000 mg | ORAL_TABLET | Freq: Every day | ORAL | Status: DC
  Administered 2019-12-03 – 2020-01-06 (×34): 10 mg
  Filled 2019-12-03 (×35): qty 1

## 2019-12-03 MED ORDER — POTASSIUM CHLORIDE 20 MEQ PO PACK: 40.0000 meq | PACK | ORAL | Status: DC

## 2019-12-03 MED ORDER — CLONIDINE HCL 0.2 MG PO TABS
0.2000 mg | ORAL_TABLET | Freq: Two times a day (BID) | ORAL | Status: DC
  Administered 2019-12-03 – 2019-12-05 (×5): 0.2 mg
  Filled 2019-12-03 (×5): qty 1

## 2019-12-03 MED ORDER — TRIAMTERENE-HCTZ 37.5-25 MG PO TABS
1.0000 | ORAL_TABLET | Freq: Every day | ORAL | Status: DC
  Administered 2019-12-03 – 2020-01-06 (×35): 1
  Filled 2019-12-03 (×36): qty 1

## 2019-12-03 MED ORDER — POTASSIUM CHLORIDE 20 MEQ PO PACK
40.0000 meq | PACK | ORAL | Status: AC
  Administered 2019-12-03 (×3): 40 meq
  Filled 2019-12-03 (×3): qty 2

## 2019-12-03 NOTE — Progress Notes (Signed)
Physical Therapy Treatment Patient Details Name: Susan [name] MRN: [id number] DOB: [birth year] Today's Date: 12/03/2019    History of Present Illness 59 year old female smoker admitted 3/10 with overdose complicated by aspiration PNA with MRSA,  Pneumococcal bacteremia, toxic metabolic encephalopathy and septic shock.  Frequent falls at home,Tobacco abuse, diabetes with painful polyneuropathy, bipolar disease, GERD, HIV, schizophrenia, history of IV drug abuse, seizure disorder. Intubated 3/10-3/29 3/23 MRI brain >>Acute nonhemorrhagic infarcts in the posterior MCA territory bilaterally. Tracheostomy placed 3/29    PT Comments    Pt admitted with above diagnosis. Pt was able to sit EOB 8 minutes with mod to max assist as she leans posteriorly.  Pt placed in chair position in bed at 65 degrees at end of treatment.   Pt currently with functional limitations due to balance and endurance deficits. Pt will benefit from skilled PT to increase their independence and safety with mobility to allow discharge to the venue listed below.     Follow Up Recommendations  SNF     Equipment Recommendations  Other (comment)(TBD at next venue)    Recommendations for Other Services OT consult     Precautions / Restrictions Precautions Precautions: Fall Precaution Comments: hx of falls at home  Restrictions Weight Bearing Restrictions: No    Mobility  Bed Mobility Overal bed mobility: Needs Assistance Bed Mobility: Supine to Sit     Supine to sit: +2 for physical assistance;Max assist     General bed mobility comments: Assist to bring LES off bed and for trunk elevation.   Transfers                 General transfer comment: unable  Ambulation/Gait             General Gait Details: unable   Stairs             Wheelchair Mobility    Modified Rankin (Stroke Patients Only) Modified Rankin (Stroke Patients Only) Pre-Morbid Rankin Score: No symptoms Modified Rankin:  Severe disability     Balance Overall balance assessment: Needs assistance Sitting-balance support: Bilateral upper extremity supported;Feet supported Sitting balance-Leahy Scale: Poor Sitting balance - Comments: relies on support as pt leanign posteriorly and varies between nedd for mod to max assist to sit EOB 8 min. As she fatigues, leans posteriorly more.  Postural control: Posterior lean                                  Cognition Arousal/Alertness: Awake/alert Behavior During Therapy: Restless Overall Cognitive Status: Difficult to assess                                 General Comments: wife awake, constantly rhythmically moving head side to side and moving extremities. Not responding to her name. was able to follow partial face washing with max A after initiation      Exercises General Exercises - Lower Extremity Long Arc Quad: AROM;Both;10 reps;Seated Heel Slides: AAROM;Both;10 reps;Seated    General Comments General comments (skin integrity, edema, etc.): VSS      Pertinent Vitals/Pain Faces Pain Scale: No hurt    Home Living                      Prior Function            PT Goals (current goals can now  be found in the care plan section) Acute Rehab PT Goals Patient Stated Goal: non verbal  Progress towards PT goals: Progressing toward goals    Frequency    Min 3X/week      PT Plan Current plan remains appropriate    Co-evaluation              AM-PAC PT "6 Clicks" Mobility   Outcome Measure  Help needed turning from your back to your side while in a flat bed without using bedrails?: Total Help needed moving from lying on your back to sitting on the side of a flat bed without using bedrails?: Total Help needed moving to and from a bed to a chair (including a wheelchair)?: Total Help needed standing up from a chair using your arms (e.g., wheelchair or bedside chair)?: Total Help needed to walk in hospital  room?: Total Help needed climbing 3-5 steps with a railing? : Total 6 Click Score: 6    End of Session Equipment Utilized During Treatment: Gait belt;Oxygen Activity Tolerance: Patient limited by fatigue Patient left: in bed;with call bell/phone within reach;with bed alarm set(in chair position in bed) Nurse Communication: Mobility status PT Visit Diagnosis: Unsteadiness on feet (R26.81);Other abnormalities of gait and mobility (R26.89);Repeated falls (R29.6);Muscle weakness (generalized) (M62.81);History of falling (Z91.81);Difficulty in walking, not elsewhere classified (R26.2);Hemiplegia and hemiparesis Hemiplegia - Right/Left: Left Hemiplegia - caused by: Cerebral infarction     Time: [id number] PT Time Calculation (min) (ACUTE ONLY): 15 min  Charges:  $Therapeutic Activity: 8-22 mins                     Luvern Mcisaac W,PT Acute Rehabilitation Services Pager:  [phone]  Office:  [place] 12/03/2019, 2:08 PM

## 2019-12-03 NOTE — Progress Notes (Signed)
[place] Progress Note Patient Name: Susan [name] DOB: 1961-03-22 MRN: [id number]   Date of Service  12/03/2019  HPI/Events of Note  RN requests posey belt restraint to help prevent the patient from falling.   eICU Interventions  Waist belt restraint ordered to help ensure patient's safety goal can be met.     Intervention Category Minor Interventions: Agitation / anxiety - evaluation and management  Charlott Rakes 12/03/2019, 9:33 PM

## 2019-12-03 NOTE — Progress Notes (Signed)
[place] Progress Note Patient Name: TIMMIA VIDAL DOB: 01-01-1961 MRN: [id number]   Date of Service  12/03/2019  HPI/Events of Note  Nursing request for CBC in AM.  eICU Interventions  Will order CBC with platelets at 5 AM.     Intervention Category Major Interventions: Other:  Melodi Happel Cornelia Copa 12/03/2019, 3:24 AM

## 2019-12-03 NOTE — Progress Notes (Signed)
NAME:  Susan [name], MRN:  [id number], DOB:  [birth year], LOS: 75 ADMISSION DATE:  11/11/2019, CONSULTATION DATE:  3/10 REFERRING MD:  Sabra Heck, CHIEF COMPLAINT:  Acute encephalopathy and respiratory failure    Brief History   59 year old female smoker admitted 3/10 with overdose complicated by aspiration PNA with MRSA,  Pneumococcal bacteremia, toxic metabolic encephalopathy and septic shock.    Past Medical History  Frequent falls at home,Tobacco abuse, diabetes with painful polyneuropathy, bipolar disease, GERD, HIV, schizophrenia, history of IV drug abuse, seizure disorder.   Significant Hospital Events   3/10 Admit with overdose, intubated on arrival, in shock   3/15 Ventilator asynchrony, heavy sedation & intermittent paralysis   3/16 Lasix drip.  Awaiting TEE.  Sputum w/ MRSA.   Coming back down on sedation, changed to [facility]. TEE showing NO EVIDENCE of endocarditis. ABX changed to pen G per ID. MRSA therapy complete. EF improved. Now up to 50% from 20-25% 3/17 Heavily sedated. Having episodes of bradycardia. Still massively overloaded (>14L), increasing lasix gtt to 8 mg from 4, adding metolazone.  3/19 Lasix to 40 every 12, changing RASS goal to -2, decrease in fentanyl, decreasing PEEP, anticipating trialing pressure support ventilation.  Watching upward trend of white blood cell count and low-grade fever 3/20 BM early am. Agitation early am > sedate post am PT psy meds 3/22 Decreased mentation and movement of extremities overnight, sent for Head CT which revealed acute vs subacute bilateral MCA infarcts  3/23 Lying in bed seen spontaneously moving lower and right upper extremity, unable to follow any commands.  3/24 Hypoglycemic episodes overnight, Weaning well 10/5 today, tmax 101.5 Remains on precedex gtt 1.2 3/29 tracheostomy placed  Consults:  ID Cards for TEE Neurology   Procedures:  ETT 3/10 >>  R Fem TLC (ER) 3/10  R IJ TLC 3/10  L IJ TLC 3/10 >> 3/17  RUE PICC 3/16 >>    Significant Diagnostic Tests:   CT brain 3/10 >> Normal head CT  EEG 3/10 >> Moderate to severe diffuse encephalopathy. No seizures  Urine drug screen 3/10 >> positive for THC, acetaminophen Q000111Q, salicylate <7, ETOH Q000111Q  ECHO 3/11 >> EF 20-25%, LV function severely diminished, Global Hypokinesis,LV hypertrophy, Grade 1 Diastolic Dysfunction, Elevated PASP, Dilated LA, MV regurgitation, small mobile density right coronary cusp  TEE 3/16 >> LVEF 55%, No LA/LAA thrombus or masses, Previously noted mass on the aortic valve is a node of Arancius.  Normal variant and no evidence of endocarditis.Trivial TR and trivial PR 3/23 MRI brain >>Acute nonhemorrhagic infarcts in the posterior MCA territory bilaterally. Otherwise no evidence of chronic ischemia. 3/23 MRA head >>No significant intracranial stenosis.  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 3/23 MRV head >> negative  Micro Data:  Influenza A/B 3/10 >> negative  COVID 3/10 >> negative  MRSA PCR 3/10 >> positive  BCx2 3/10 >> streptococcus pneumoniae >> S-rocephin BCx1 (aerobic only) 3/10 >> negative  Tracheal aspirate 3/11 >> MRSA BCx2 3/12 >> negative  BCx2 3/25 >> aerococcus  Resp cx 3/25 >> Klebsiella (R amp)   Antimicrobials:  Vancomycin 3/10 >> 3/16 Unasyn 3/10 >> 3/16 Pen G 3/16 >> 3/23 Cefepime 3/26 >>   Interim history/subjective:  No significant changes reported She did ATC all day yesterday and went back on MV 2300 Has had persistent hypertension Note hypokalemia this morning after lasix 3/31 I/o -801 yesterday  Objective   Blood pressure (!) 178/92, pulse 94, temperature 98.3 F (36.8 C), temperature  source Axillary, resp. rate 20, height 5\' 3"  (1.6 m), weight 106 kg, SpO2 96 %.    Vent Mode: PRVC FiO2 (%):  [40 %] 40 % Set Rate:  [22 bmp] 22 bmp Vt Set:  [420 mL] 420 mL PEEP:  [5 cmH20] 5 cmH20 Plateau Pressure:  [10 cmH20-11 cmH20] 11 cmH20   Intake/Output Summary (Last 24  hours) at 12/03/2019 0932 Last data filed at 12/03/2019 0800 Gross per 24 hour  Intake 2548.59 ml  Output 3185 ml  Net -636.41 ml   Filed Weights   12/01/19 0500 12/02/19 0500 12/03/19 0500  Weight: 106.5 kg 106 kg 106 kg   Examination:  General: Chronically ill obese woman in bed, comfortable HEENT: Trach in place, pupils equal, no secretions, no stridor Neuro: Eyes open, occasionally appears to track although not consistent.  She moves her head randomly side to side, able to move upper extremities and lower extremities as well.  Does not follow commands. CV: Regular, distant, no murmur PULM: Clear superiorly, decreased at both bases, no wheezing GI: Obese, nondistended, positive bowel sounds Extremities: 1+ pitting lower extremity edema Skin: No rash  Resolved Hospital Problem list   Rhabdomyolysis Elevated INR Septic shock MRSA and aspiration pneumonia, and Pneumococcal bacteremia (ABX competed 3/23)  Assessment & Plan:   Acute hypoxic/hypercapnic respiratory failure with ARDS from MRSA PNA and aspiration pneumonia. Hx of asthma. Trach placed 3/29 Plan: Tolerated ATC on 3/31, attempt to go to 24x7 Continue DuoNeb and albuterol as needed VAP prevention order set Suctioning as needed Standard trach care  Aerococcus bacteremia 3/25 Klebsiella bronchitis/pneumonia 3/25 Cefepime day 7 of 7 on 4/1   Acute bilateral large bilateral posterior MCA infarcts - 3/23  -TEE 3/16 with no sign of endocarditis  - MRI brain c/w b/l posterior MCA territory infarcts; MRA/ MRV neg Plan: Guarded neurological prognosis.  Remains aphasic, debilitated.  Family hoping for some meaningful recovery with extended care.  She has tracheostomy and will need PEG placement.  I will work on arranging. Neurology signed off 3/25, noted poor prognosis for meaningful recovery Continue PT/OT as she can tolerate Aspirin 81 mg  Hx of HIV Plan: Continue home Descovy and T[place]ay   Acute systolic heart  failure with sepsis induced CM. Hx of HTN, HLD. Total body volume overload Plan: Continue clonidine Add back her home amlodipine and Maxide on 4/1 Home enalapril, beta-blocker on hold Continue Crestor  Acute metabolic encephalopathy 2nd to sepsis, hypoxia and delirium  Hx of schizophrenia, seizures. Plan: Minimize sedating medications, continuous sedation is off Currently on Cogentin, BuSpar, doxepin, Neurontin, Zoloft, Seroquel.  We may be able to start weaning some of these if her agitation improves  Acute Kidney Injury 2nd to ATN in setting of septic shock and hypoxia. (improved) Hypokalemia, Hypernatremia - baseline creatinine 0.78 from 06/29/19 Plan: Empiric daily diuresis, Lasix given 3/31, repeat on 4/1.  Total body volume overload Replace potassium with diuresis Follow BMP, urine output  Constipation. Plan: Continue bowel regimen - scheduled miralax, senokot, prn dulcolax  DM type II poorly controlled with hyperglycemia. - HbA1C 12.3 from 11/11/19 Plan: Continue Lantus 22 units twice daily Continue sliding scale insulin with tube feed coverage  At risk malnutrition Will need PEG, discussed with daughter on 4/1  [place]  Full code.   Best practice:  Diet: tube feeds DVT prophylaxis: SQ heparin GI prophylaxis: protonix Mobility: BR Code Status: full code  Family Communication: Spoke with patient's daughter 4/1. Disposition: to ICU   Labs:   CMP  Latest Ref Rng & Units 12/03/2019 12/02/2019 12/01/2019  Glucose 70 - 99 mg/dL 77 111(H) 243(H)  BUN 6 - 20 mg/dL 34(H) 39(H) 49(H)  Creatinine 0.44 - 1.00 mg/dL 1.03(H) 1.17(H) 1.45(H)  Sodium 135 - 145 mmol/L 145 144 146(H)  Potassium 3.5 - 5.1 mmol/L 3.3(L) 3.0(L) 3.9  Chloride 98 - 111 mmol/L 102 102 104  CO2 22 - 32 mmol/L 32 32 31  Calcium 8.9 - 10.3 mg/dL 8.8(L) 8.5(L) 8.7(L)  Total Protein 6.5 - 8.1 g/dL - - -  Total Bilirubin 0.3 - 1.2 mg/dL - - -  Alkaline Phos 38 - 126 U/L - - -  AST 15 - 41 U/L - - -  ALT  0 - 44 U/L - - -    CBC Latest Ref Rng & Units 12/03/2019 12/02/2019 12/01/2019  WBC 4.0 - 10.5 K/uL 12.9(H) 12.1(H) 11.9(H)  Hemoglobin 12.0 - 15.0 g/dL 9.5(L) 8.5(L) 8.1(L)  Hematocrit 36.0 - 46.0 % 31.3(L) 28.5(L) 27.4(L)  Platelets 150 - 400 K/uL 511(H) 506(H) 501(H)    ABG    Component Value Date/Time   PHART 7.517 (H) 11/29/2019 0548   PCO2ART 43.3 11/29/2019 0548   PO2ART 46.0 (L) 11/29/2019 0548   HCO3 35.1 (H) 11/29/2019 0548   TCO2 36 (H) 11/29/2019 0548   ACIDBASEDEF 4.0 (H) 11/12/2019 1504   O2SAT 85.0 11/29/2019 0548    CBG (last 3)  Recent Labs    12/02/19 2346 12/03/19 0344 12/03/19 [place] 126* 23 162*       Baltazar Apo, MD, PhD 12/03/2019, 9:32 AM [facility] [phone] or if no answer [phone]

## 2019-12-03 NOTE — Progress Notes (Signed)
Pt continually sitting up in bed and leaning over side rail.  Bed in lowest position, bed alarm on and floor mats placed.  Called [employer] to request order for Posey belt.  Will continue to monitor closely.

## 2019-12-04 DIAGNOSIS — J9601 Acute respiratory failure with hypoxia: Secondary | ICD-10-CM | POA: Diagnosis not present

## 2019-12-04 LAB — GLUCOSE, CAPILLARY
Glucose-Capillary: 117 mg/dL — ABNORMAL HIGH (ref 70–99)
Glucose-Capillary: 117 mg/dL — ABNORMAL HIGH (ref 70–99)
Glucose-Capillary: 121 mg/dL — ABNORMAL HIGH (ref 70–99)
Glucose-Capillary: 135 mg/dL — ABNORMAL HIGH (ref 70–99)
Glucose-Capillary: 147 mg/dL — ABNORMAL HIGH (ref 70–99)
Glucose-Capillary: 168 mg/dL — ABNORMAL HIGH (ref 70–99)
Glucose-Capillary: 64 mg/dL — ABNORMAL LOW (ref 70–99)
Glucose-Capillary: 68 mg/dL — ABNORMAL LOW (ref 70–99)

## 2019-12-04 LAB — BASIC METABOLIC PANEL
Anion gap: 10 (ref 5–15)
BUN: 31 mg/dL — ABNORMAL HIGH (ref 6–20)
CO2: 31 mmol/L (ref 22–32)
Calcium: 8.8 mg/dL — ABNORMAL LOW (ref 8.9–10.3)
Chloride: 100 mmol/L (ref 98–111)
Creatinine, Ser: 1.04 mg/dL — ABNORMAL HIGH (ref 0.44–1.00)
GFR calc Af Amer: >60 mL/min (ref >60)
GFR calc non Af Amer: 60 mL/min — ABNORMAL LOW (ref >60)
Glucose, Bld: 152 mg/dL — ABNORMAL HIGH (ref 70–99)
Potassium: 3.5 mmol/L (ref 3.5–5.1)
Sodium: 141 mmol/L (ref 135–145)

## 2019-12-04 LAB — MAGNESIUM: Magnesium: 2 mg/dL (ref 1.7–2.4)

## 2019-12-04 MED ORDER — HALOPERIDOL LACTATE 5 MG/ML IJ SOLN
5.0000 mg | Freq: Once | INTRAMUSCULAR | Status: AC
  Administered 2019-12-04: 5 mg via INTRAVENOUS
  Filled 2019-12-04: qty 1

## 2019-12-04 MED ORDER — DEXTROSE 50 % IV SOLN
INTRAVENOUS | Status: AC
  Administered 2019-12-04: 50 mL
  Filled 2019-12-04: qty 50

## 2019-12-04 MED ORDER — INSULIN GLARGINE 100 UNIT/ML ~~LOC~~ SOLN
17.0000 [IU] | Freq: Two times a day (BID) | SUBCUTANEOUS | Status: DC
  Administered 2019-12-04 – 2019-12-05 (×2): 17 [IU] via SUBCUTANEOUS
  Filled 2019-12-04 (×3): qty 0.17

## 2019-12-04 MED ORDER — METOPROLOL TARTRATE 25 MG/10 ML ORAL SUSPENSION
12.5000 mg | Freq: Two times a day (BID) | ORAL | Status: DC
  Administered 2019-12-04 – 2020-01-06 (×66): 12.5 mg
  Filled 2019-12-04 (×19): qty 5
  Filled 2019-12-04: qty 10
  Filled 2019-12-04 (×14): qty 5
  Filled 2019-12-04: qty 10
  Filled 2019-12-04 (×20): qty 5
  Filled 2019-12-04: qty 10
  Filled 2019-12-04 (×14): qty 5

## 2019-12-04 MED ORDER — HYDRALAZINE HCL 20 MG/ML IJ SOLN: 10.0000 mg | Freq: Four times a day (QID) | INTRAMUSCULAR | Status: DC | PRN

## 2019-12-04 MED ORDER — CEFAZOLIN SODIUM-DEXTROSE 2-4 GM/100ML-% IV SOLN
2.0000 g | INTRAVENOUS | Status: AC
  Administered 2019-12-07: 2 g via INTRAVENOUS
  Filled 2019-12-04: qty 100

## 2019-12-04 MED ORDER — DEXTROSE 50 % IV SOLN
INTRAVENOUS | Status: AC
  Administered 2019-12-04: 25 mL
  Filled 2019-12-04: qty 50

## 2019-12-04 MED ORDER — FUROSEMIDE 10 MG/ML IJ SOLN
40.0000 mg | Freq: Once | INTRAMUSCULAR | Status: AC
  Administered 2019-12-04: 40 mg via INTRAVENOUS
  Filled 2019-12-04: qty 4

## 2019-12-04 NOTE — Progress Notes (Signed)
Hypoglycemic Event  CBG: 68  Treatment: D50 50 mL (25 gm)  Symptoms: None  Follow-up CBG: [id number] CBG Result: 117  Possible Reasons for Event: Unknown    [facility]

## 2019-12-04 NOTE — Consult Note (Signed)
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  Possible small left pleural effusion. Electronically Signed   By: Julian Hy M.D.   On: 11/14/2019 07:11   DG CHEST PORT 1 VIEW  Result Date: 11/12/2019 CLINICAL DATA:  Central venous line placement EXAM: PORTABLE CHEST 1 VIEW COMPARISON:  11/12/2019, 7:38 a.m. FINDINGS: Interval placement of left neck vascular catheter, tip projecting over the mid SVC. Right neck catheter remains in position over the extrathoracic portion of the jugular vessels. Endotracheal tube has been slightly retracted, projecting 2.5 cm above the carina. Esophagogastric tube remains with tip and side port below the diaphragm. Unchanged extensive bilateral heterogeneous airspace opacity. IMPRESSION: 1. Interval placement of left neck vascular catheter, tip projecting over the mid SVC. 2. Right neck catheter remains in position over the extrathoracic portion of the jugular vessels. 3. Slight interval retraction of  endotracheal tube, projecting 2.5 cm above the carina. 4. Unchanged extensive bilateral heterogeneous airspace opacity. Electronically Signed   By: Eddie Candle M.D.   On: 11/12/2019 13:40   DG CHEST PORT 1 VIEW  Result Date: 11/12/2019 CLINICAL DATA:  59 year old female status post line placement. EXAM: PORTABLE CHEST 1 VIEW COMPARISON:  Chest x-ray 11/12/2019. FINDINGS: An endotracheal tube is in place with tip 1.7 cm above the  carina. There is a right-sided internal jugular central venous catheter with tip terminating in a high position above the level of the thoracic inlet on the right. A nasogastric tube is seen extending into the stomach, however, the tip of the nasogastric tube extends below the lower margin of the image. Lung volumes are low. Patchy multifocal areas of airspace consolidation and interstitial prominence scattered throughout the lungs bilaterally no definite pleural effusions. Pulmonary vasculature is obscured. Heart size appears borderline enlarged. IMPRESSION: 1. Support apparatus, as above. Please take note of the near complete displacement of the right IJ catheter, and the low position of the endotracheal tube which is currently 1.7 cm above the carina (this tube should be withdrawn 3 cm for more optimal placement). 2. Persistent patchy asymmetrically distributed interstitial and airspace disease in the lungs bilaterally, with slight worsened aeration compared to the prior study, favored to reflect progressive multilobar pneumonia. Electronically Signed   By: Vinnie Langton M.D.   On: 11/12/2019 08:06   Portable chest x-ray (1 view)  Result Date: 11/12/2019 CLINICAL DATA:  Acute respiratory failure with hypoxemia EXAM: PORTABLE CHEST 1 VIEW COMPARISON:  11/11/2019 FINDINGS: The endotracheal tube is 16 mm above the carina. The NG tube is coursing down the esophagus and into the stomach. New right IJ central venous catheter tip is in the distal SVC. The cardiac silhouette,  mediastinal and hilar contours are stable. Persistent diffuse but patchy and asymmetric interstitial and airspace process in the lungs, likely infection. IMPRESSION: 1. The endotracheal tube is 16 mm above the carina. 2. New right IJ central venous catheter tip is in the distal SVC. 3. Persistent bilateral infiltrates. Electronically Signed   By: Marijo Sanes M.D.   On: 11/12/2019 06:40   DG Chest Port 1 View  Result Date: 11/11/2019 CLINICAL DATA:  Hypoxia EXAM: PORTABLE CHEST 1 VIEW COMPARISON:  February 15, 2009 FINDINGS: Endotracheal tube tip is 1.5 cm above the carina. Nasogastric tube tip is below the diaphragm with side port seen in the stomach. No pneumothorax. There is airspace opacity in each lower lobe with consolidation in the left lower lobe. Ill-defined airspace opacity is also noted to a lesser degree in the upper lobes, slightly more on the right than on the left. Heart size and pulmonary vascularity are normal. No adenopathy. No bone lesions. IMPRESSION: Tube positions as described without pneumothorax. Note that endotracheal tube is fairly close to the carina. It may be reasonable to consider withdrawing endotracheal tube 1.5-2 cm. Extensive multifocal infiltrate with consolidation greatest in the left lower lobe. These findings are indicative of multifocal pneumonia. Heart size normal. No evident adenopathy. Electronically Signed   By: Lowella Grip III M.D.   On: 11/11/2019 15:13   DG Abd Portable 1V  Result Date: 11/24/2019 CLINICAL DATA:  OG tube placement. EXAM: PORTABLE ABDOMEN - 1 VIEW COMPARISON:  11/23/2019. FINDINGS: OG tube noted with tip over the stomach. Large amount of stool noted throughout the colon. No bowel distention or free air. Degenerative change lumbar spine. No acute bony abnormality identified. IMPRESSION: OG tube noted with tip over the stomach. Large amount of stool noted throughout the colon. Constipation could present in this fashion. No bowel distention or  free air. Electronically Signed   By: Marcello Moores  Register   On: 11/24/2019 07:13   EEG adult  Result Date: 11/12/2019 Lora Havens, MD     11/12/2019 12:55 PM Patient Name: Susan [name] MRN: [id number] Epilepsy Attending: Lora Havens Referring Physician/Provider: Collier Salina  Kary Kos, NP Date: 11/12/2019 Duration: 23.20 minutes Patient history: 59 year old female presented with altered mental status.  EEG to evaluate for seizures. Level of alertness: Comatose AEDs during EEG study: None Technical aspects: This EEG study was done with scalp electrodes positioned according to the 10-20 International system of electrode placement. Electrical activity was acquired at a sampling rate of 500Hz  and reviewed with a high frequency filter of 70Hz  and a low frequency filter of 1Hz . EEG data were recorded continuously and digitally stored. Description: EEG showed continuous generalized high amplitude 3 to 5 Hz theta-delta slowing, at times with triphasic morphology.  Hyperventilation and photic stimulation were not performed. Abnormality - Continuous low, generalized - Triphasic waves, generalized IMPRESSION: This study is suggestive of moderate to severe diffuse encephalopathy, nonspecific to etiology but could be secondary to toxic metabolic causes. No seizures or epileptiform discharges were seen throughout the recording. Lora Havens   ECHOCARDIOGRAM COMPLETE  Result Date: 11/12/2019    ECHOCARDIOGRAM REPORT   Patient Name:   NAYARI PASSERO [facility] Date of Exam: 11/12/2019 Medical Rec #:  [id number]      Height:       63.0 in Accession #:    [id number]     Weight:       251.3 lb Date of Birth:  11/22/1960      BSA:          2.131 m Patient Age:    60 years       BP:           108/63 mmHg Patient Gender: F              HR:           112 bpm. Exam Location:  Inpatient Procedure: 2D Echo, Color Doppler and Cardiac Doppler Indications:    Shock  History:        Patient has no prior history of Echocardiogram examinations.                  Risk Factors:Hypertension, Dyslipidemia, Diabetes and Current                 Smoker. Polysubstance abuse. HIV.  Sonographer:    Clayton Lefort RDCS (AE) Referring Phys: [id number] CHI JANE ELLISON IMPRESSIONS  1. Left ventricular ejection fraction, by estimation, is 20 to 25%. The left ventricle has severely decreased function. The left ventricle demonstrates global hypokinesis. There is mild concentric left ventricular hypertrophy. Left ventricular diastolic  parameters are consistent with Grade I diastolic dysfunction (impaired relaxation).  2. Right ventricular systolic function is normal. The right ventricular size is normal. There is mildly elevated pulmonary artery systolic pressure.  3. Left atrial size was mildly dilated.  4. The mitral valve is normal in structure. Trivial mitral valve regurgitation. No evidence of mitral stenosis.  5. There is a small, mobile density on the right coronary cusp measuring 0.82 cm x 0.31 cm. Consider TEE to better evaluate. The aortic valve is normal in structure. Aortic valve regurgitation is not visualized. No aortic stenosis is present.  6. The inferior vena cava is dilated in size with <50% respiratory variability, suggesting right atrial pressure of 15 mmHg. FINDINGS  Left Ventricle: Left ventricular ejection fraction, by estimation, is 20 to 25%. The left ventricle has severely decreased function. The left ventricle demonstrates global hypokinesis. The left ventricular internal cavity size was normal in size. There is mild concentric left ventricular hypertrophy. Left ventricular diastolic parameters are consistent with Grade I diastolic  dysfunction (impaired relaxation). Right Ventricle: The right ventricular size is normal. No increase in right ventricular wall thickness. Right ventricular systolic function is normal. There is mildly elevated pulmonary artery systolic pressure. The tricuspid regurgitant velocity is 2.01  m/s, and with an assumed right atrial  pressure of 15 mmHg, the estimated right ventricular systolic pressure is [id number] mmHg. Left Atrium: Left atrial size was mildly dilated. Right Atrium: Right atrial size was normal in size. Pericardium: There is no evidence of pericardial effusion. Mitral Valve: The mitral valve is normal in structure. Normal mobility of the mitral valve leaflets. Trivial mitral valve regurgitation. No evidence of mitral valve stenosis. Tricuspid Valve: The tricuspid valve is normal in structure. Tricuspid valve regurgitation is trivial. No evidence of tricuspid stenosis. Aortic Valve: There is a small, mobile density on the right coronary cusp measuring 0.82 cm x 0.31 cm. Consider TEE to better evaluate. The aortic valve is normal in structure.. There is mild thickening and mild calcification of the aortic valve. Aortic valve regurgitation is not visualized. No aortic stenosis is present. There is mild thickening of the aortic valve. There is mild calcification of the aortic valve. Aortic valve mean gradient measures 4.0 mmHg. Aortic valve peak gradient measures 6.9 mmHg. Aortic valve area, by VTI measures 1.99 cm. Pulmonic Valve: The pulmonic valve was normal in structure. Pulmonic valve regurgitation is not visualized. No evidence of pulmonic stenosis. Aorta: The aortic root is normal in size and structure. Venous: The inferior vena cava is dilated in size with less than 50% respiratory variability, suggesting right atrial pressure of 15 mmHg. IAS/Shunts: No atrial level shunt detected by color flow Doppler.  LEFT VENTRICLE PLAX 2D LVIDd:         4.98 cm LVIDs:         4.47 cm LV PW:         1.25 cm LV IVS:        1.34 cm LVOT diam:     1.90 cm LV SV:         35 LV SV Index:   16 LVOT Area:     2.84 cm  RIGHT VENTRICLE            IVC RV Basal diam:  2.88 cm    IVC diam: 2.21 cm RV S prime:     6.53 cm/s TAPSE (M-mode): 0.9 cm LEFT ATRIUM             Index       RIGHT ATRIUM           Index LA diam:        2.40 cm 1.13 cm/m  RA  Area:     10.10 cm LA Vol (A2C):   67.5 ml 31.68 ml/m RA Volume:   22.90 ml  10.75 ml/m LA Vol (A4C):   55.6 ml 26.09 ml/m LA Biplane Vol: 62.5 ml 29.33 ml/m  AORTIC VALVE AV Area (Vmax):    2.14 cm AV Area (Vmean):   2.05 cm AV Area (VTI):     1.99 cm AV Vmax:           131.00 cm/s AV Vmean:          94.700 cm/s AV VTI:            0.175 m AV Peak Grad:      6.9 mmHg AV Mean Grad:      4.0 mmHg LVOT Vmax:         98.80 cm/s LVOT  Vmean:        68.500 cm/s LVOT VTI:          0.123 m LVOT/AV VTI ratio: 0.70  AORTA Ao Root diam: 2.30 cm Ao Asc diam:  2.70 cm TRICUSPID VALVE TR Peak grad:   16.2 mmHg TR Vmax:        201.00 cm/s  SHUNTS Systemic VTI:  0.12 m Systemic Diam: 1.90 cm Skeet Latch MD Electronically signed by Skeet Latch MD Signature Date/Time: 11/12/2019/5:48:56 PM    Final    ECHO TEE  Result Date: 11/17/2019    TRANSESOPHOGEAL ECHO REPORT   Patient Name:   SHAWNTINA TARANTOLA [facility] Date of Exam: 11/17/2019 Medical Rec #:  [id number]      Height:       63.0 in Accession #:    [id number]     Weight:       239.6 lb Date of Birth:  05-08-1961      BSA:          2.088 m Patient Age:    35 years       BP:           115/58 mmHg Patient Gender: F              HR:           89 bpm. Exam Location:  Inpatient Procedure: Transesophageal Echo, Color Doppler and Cardiac Doppler Indications:     Bacteremia  History:         Patient has prior history of Echocardiogram examinations, most                  recent 11/12/2019. Signs/Symptoms:Bacteremia; Risk                  Factors:Diabetes. HIV, morbid obesity.  Sonographer:     Dustin Flock Referring Phys:  [place] Diagnosing Phys: Skeet Latch MD PROCEDURE: The transesophogeal probe was passed without difficulty through the esophogus of the patient. Sedation performed by performing physician. The patient's vital signs; including heart rate, blood pressure, and oxygen saturation; remained stable throughout the procedure. The patient developed no  complications during the procedure. Continuius Fentanyl drip. IMPRESSIONS  1. Left ventricular ejection fraction, by estimation, is 55 to 60%. The left ventricle has normal function. The left ventricle has no regional wall motion abnormalities.  2. Right ventricular systolic function is normal. The right ventricular size is normal.  3. No left atrial/left atrial appendage thrombus was detected.  4. The mitral valve is normal in structure. Trivial mitral valve regurgitation. No evidence of mitral stenosis.  5. Prominant node of Arancius on the L coronary cusp. No vegetations.. The aortic valve is normal in structure. Aortic valve regurgitation is not visualized. No aortic stenosis is present. Aortic valve Vmax measures 2.20 m/s.  6. The inferior vena cava is normal in size with greater than 50% respiratory variability, suggesting right atrial pressure of 3 mmHg. Conclusion(s)/Recommendation(s): Normal biventricular function without evidence of hemodynamically significant valvular heart disease. No evidence of vegetation/infective endocarditis on this transesophageal echocardiogram. FINDINGS  Left Ventricle: Left ventricular ejection fraction, by estimation, is 55 to 60%. The left ventricle has normal function. The left ventricle has no regional wall motion abnormalities. The left ventricular internal cavity size was normal in size. There is  no left ventricular hypertrophy. Right Ventricle: The right ventricular size is normal. No increase in right ventricular wall thickness. Right ventricular systolic function is normal. Left Atrium: Left atrial size  was normal in size. No left atrial/left atrial appendage thrombus was detected. Right Atrium: Right atrial size was normal in size. Pericardium: There is no evidence of pericardial effusion. Mitral Valve: The mitral valve is normal in structure. Normal mobility of the mitral valve leaflets. Trivial mitral valve regurgitation. No evidence of mitral valve stenosis.  Tricuspid Valve: The tricuspid valve is normal in structure. Tricuspid valve regurgitation is trivial. No evidence of tricuspid stenosis. Aortic Valve: Prominant node of Arancius on the L coronary cusp. No vegetations. The aortic valve is normal in structure. Aortic valve regurgitation is not visualized. No aortic stenosis is present. Aortic valve mean gradient measures 10.0 mmHg. Aortic valve peak gradient measures 19.4 mmHg. Pulmonic Valve: The pulmonic valve was normal in structure. Pulmonic valve regurgitation is not visualized. No evidence of pulmonic stenosis. Aorta: Mild plaque in the descending aorta. The aortic root is normal in size and structure. Venous: The inferior vena cava is normal in size with greater than 50% respiratory variability, suggesting right atrial pressure of 3 mmHg. IAS/Shunts: No atrial level shunt detected by color flow Doppler. Additional Comments: Compared with the echo [id number], systolic function has normalized.  AORTIC VALVE AV Vmax:      220.00 cm/s AV Peak Grad: 19.4 mmHg AV Mean Grad: 10.0 mmHg TRICUSPID VALVE TR Peak grad:   36.7 mmHg TR Vmax:        303.00 cm/s Skeet Latch MD Electronically signed by Skeet Latch MD Signature Date/Time: 11/17/2019/4:58:09 PM    Final    VAS US CAROTID  Result Date: 11/24/2019 Carotid Arterial Duplex Study Indications:       CVA. Risk Factors:      Hypertension, hyperlipidemia, Diabetes. Limitations        Today's exam was limited due to the body habitus of the                    patient, the patient's inability or unwillingness to                    cooperate, patient on a ventilator and unable to hyperextend                    neck, trach collar. Comparison Study:  No prior exam. Performing Technologist: Baldwin Crown ARDMS, RVT  Examination Guidelines: A complete evaluation includes B-mode imaging, spectral Doppler, color Doppler, and power Doppler as needed of all accessible portions of each vessel. Bilateral testing is  considered an integral part of a complete examination. Limited examinations for reoccurring indications may be performed as noted.  Right Carotid Findings: +----------+-------+--------+--------+-----------------------+-----------------+           PSV    EDV cm/sStenosisPlaque Description     Comments                    cm/s                                                            +----------+-------+--------+--------+-----------------------+-----------------+ CCA Prox  88     19                                                       +----------+-------+--------+--------+-----------------------+-----------------+  CCA Distal73     17                                     intimal                                                                   thickening        +----------+-------+--------+--------+-----------------------+-----------------+ ICA Prox  55     14      1-39%   heterogenous and       Shadowing                                          calcific                                 +----------+-------+--------+--------+-----------------------+-----------------+ ICA Distal80     29                                                       +----------+-------+--------+--------+-----------------------+-----------------+ ECA       117    17                                                       +----------+-------+--------+--------+-----------------------+-----------------+ +----------+--------+-------+----------------+-------------------+           PSV cm/sEDV cmsDescribe        Arm Pressure (mmHG) +----------+--------+-------+----------------+-------------------+ [id number]            Multiphasic, WNL                    +----------+--------+-------+----------------+-------------------+ +---------+--------+--+--------+-+---------+ VertebralPSV cm/s44EDV cm/s9Antegrade +---------+--------+--+--------+-+---------+  Left Carotid Findings:  +----------+-------+--------+--------+-----------------------+-----------------+           PSV    EDV cm/sStenosisPlaque Description     Comments                    cm/s                                                            +----------+-------+--------+--------+-----------------------+-----------------+ CCA Prox  103    23                                                       +----------+-------+--------+--------+-----------------------+-----------------+ CCA Distal60     18  intimal                                                                   thickening        +----------+-------+--------+--------+-----------------------+-----------------+ ICA Prox  92     29      1-39%   heterogenous and       Shadowing                                          calcific                                 +----------+-------+--------+--------+-----------------------+-----------------+ ICA Distal108    33                                                       +----------+-------+--------+--------+-----------------------+-----------------+ ECA       296    68      >50%    heterogenous and                                                          calcific                                 +----------+-------+--------+--------+-----------------------+-----------------+ +----------+--------+--------+----------------+-------------------+           PSV cm/sEDV cm/sDescribe        Arm Pressure (mmHG) +----------+--------+--------+----------------+-------------------+ Subclavian119             Multiphasic, WNL                    +----------+--------+--------+----------------+-------------------+ +---------+--------+--+--------+--+---------+ VertebralPSV cm/s42EDV cm/s15Antegrade +---------+--------+--+--------+--+---------+   Summary: Right Carotid: Velocities in the right ICA are consistent with a 1-39% stenosis.                 Non-hemodynamically significant plaque <50% noted in the CCA. Left Carotid: Velocities in the left ICA are consistent with a 1-39% stenosis.               Non-hemodynamically significant plaque <50% noted in the CCA. The               ECA appears >50% stenosed. Vertebrals:  Bilateral vertebral arteries demonstrate antegrade flow. Subclavians: Normal flow hemodynamics were seen in bilateral subclavian              arteries. *See table(s) above for measurements and observations.  Electronically signed by Deitra Mayo MD on 11/24/2019 at 1:07:35 PM.    Final    VAS [place] LOWER EXTREMITY VENOUS (DVT)  Result Date: 11/24/2019  Lower Venous DVTStudy Indications: Stroke.  Anticoagulation: Heparin. Limitations: Body habitus, poor ultrasound/tissue interface and vented, uncooperative. Comparison Study: No prior  exam. Performing Technologist: Baldwin Crown ARDMS, RVT  Examination Guidelines: A complete evaluation includes B-mode imaging, spectral Doppler, color Doppler, and power Doppler as needed of all accessible portions of each vessel. Bilateral testing is considered an integral part of a complete examination. Limited examinations for reoccurring indications may be performed as noted. The reflux portion of the exam is performed with the patient in reverse Trendelenburg.  +---------+---------------+---------+-----------+----------+------------------+ RIGHT    CompressibilityPhasicitySpontaneityPropertiesThrombus Aging     +---------+---------------+---------+-----------+----------+------------------+ CFV      Full           Yes      Yes                                     +---------+---------------+---------+-----------+----------+------------------+ SFJ      Full                                                            +---------+---------------+---------+-----------+----------+------------------+ FV Prox  Full                                                             +---------+---------------+---------+-----------+----------+------------------+ FV Mid   Full                                                            +---------+---------------+---------+-----------+----------+------------------+ FV DistalFull                                         visualized with                                                          color              +---------+---------------+---------+-----------+----------+------------------+ PFV      Full                                                            +---------+---------------+---------+-----------+----------+------------------+ POP      Full           Yes      Yes                                     +---------+---------------+---------+-----------+----------+------------------+ PTV  Not visualized     +---------+---------------+---------+-----------+----------+------------------+ PERO                                                  Not visualized     +---------+---------------+---------+-----------+----------+------------------+   +---------+---------------+---------+-----------+----------+------------------+ LEFT     CompressibilityPhasicitySpontaneityPropertiesThrombus Aging     +---------+---------------+---------+-----------+----------+------------------+ CFV      Full           Yes      Yes                                     +---------+---------------+---------+-----------+----------+------------------+ SFJ      Full                                                            +---------+---------------+---------+-----------+----------+------------------+ FV Prox  Full                                                            +---------+---------------+---------+-----------+----------+------------------+ FV Mid   Full                                                             +---------+---------------+---------+-----------+----------+------------------+ FV DistalFull                                         visualized with                                                          color              +---------+---------------+---------+-----------+----------+------------------+ PFV      Full                                                            +---------+---------------+---------+-----------+----------+------------------+ POP      Full           Yes      Yes                                     +---------+---------------+---------+-----------+----------+------------------+ PTV      Full  poorly visualized  +---------+---------------+---------+-----------+----------+------------------+ PERO                                                  Not visualized     +---------+---------------+---------+-----------+----------+------------------+     Summary: RIGHT: - There is no evidence of deep vein thrombosis in the lower extremity. However, portions of this examination were limited- see technologist comments above.  - No cystic structure found in the popliteal fossa.  LEFT: - There is no evidence of deep vein thrombosis in the lower extremity. However, portions of this examination were limited- see technologist comments above.  - No cystic structure found in the popliteal fossa.  *See table(s) above for measurements and observations. Electronically signed by Deitra Mayo MD on 11/24/2019 at 1:06:56 PM.    Final    [place] EKG SITE RITE  Result Date: 11/17/2019 If Site Rite image not attached, placement could not be confirmed due to current cardiac rhythm.  [place] EKG SITE RITE  Result Date: 11/17/2019 If Site Rite image not attached, placement could not be confirmed due to current cardiac rhythm.   Labs:  CBC: Recent Labs    11/29/19 0410 11/29/19 0410 11/29/19 0548 12/01/19 0452 12/02/19 0511  12/03/19 0350  WBC 10.2  --   --  11.9* 12.1* 12.9*  HGB 8.4*   < > 7.8* 8.1* 8.5* 9.5*  HCT 29.0*   < > 23.0* 27.4* 28.5* 31.3*  PLT 570*  --   --  501* 506* 511*   < > = values in this interval not displayed.    COAGS: Recent Labs    11/15/19 0425 11/16/19 0442 11/17/19 0341 11/30/19 1103  INR 1.1 1.1 1.2 1.2  APTT  --   --   --  26    BMP: Recent Labs    12/01/19 0452 12/02/19 0511 12/03/19 0350 12/04/19 0433  NA 146* 144 145 141  K 3.9 3.0* 3.3* 3.5  CL 104 102 102 100  CO2 31 32 32 31  GLUCOSE 243* 111* 77 152*  BUN 49* 39* 34* 31*  CALCIUM 8.7* 8.5* 8.8* 8.8*  CREATININE 1.45* 1.17* 1.03* 1.04*  GFRNONAA 40* 52* >60 60*  GFRAA 46* 60* >60 >60    LIVER FUNCTION TESTS: Recent Labs    06/29/19 1341 11/11/19 1453 11/14/19 0411 11/25/19 0500  BILITOT 0.2 0.4 0.6 0.6  AST 11 43* 42* 46*  ALT 8 18 18  35  ALKPHOS  --  89 103 288*  PROT 6.5 5.5* 4.9* 7.0  ALBUMIN  --  2.4* 1.6* 1.2*    TUMOR MARKERS: No results for input(s): AFPTM, CEA, CA199, CHROMGRNA in the last 8760 hours.  Assessment and Plan:  Encephalopathy Dysphagia Malnutrition Long term care Scheduled for percutaneous gastric tube placement in IR 4/5  Risks and benefits image guided gastrostomy tube placement was discussed with the patient's daughter Aquinna via phone including, but not limited to the need for a barium enema during the procedure, bleeding, infection, peritonitis and/or damage to adjacent structures.  All questions were answered, she is agreeable to proceed. Consent signed and in chart.   Thank you for this interesting consult.  I greatly enjoyed meeting ASHANTA PENNY and look forward to participating in their care.  A copy of this report was sent to the requesting provider on this date.  Electronically Signed: Lesleigh Noe  Nonie Hoyer, PA-C 12/04/2019, 11:28 AM   I spent a total of 40 Minutes    in face to face in clinical consultation, greater than 50% of which was  counseling/coordinating care for perc G tube placement

## 2019-12-04 NOTE — Progress Notes (Signed)
[facility] Progress Note Patient Name: Susan [name] DOB: [birth year] MRN: [id number]   Date of Service  12/04/2019  HPI/Events of Note  Notified of patient agitation, seen trying to sit up in bed. Informed that sedation is to be avoided. Was given haldol last night and which seemed to have worked.  eICU Interventions  Ordered a time dose of Haldol 5 mg IV     Intervention Category Minor Interventions: Agitation / anxiety - evaluation and management  Judd Lien 12/04/2019, 7:33 PM
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
## 2019-12-04 NOTE — Progress Notes (Signed)
SLP Cancellation Note  Patient Details Name: Susan [name] MRN: [id number] DOB: 1960/10/10   Cancelled treatment:       Reason Eval/Treat Not Completed: Fatigue/lethargy limiting ability to participate   Otho Bellows 12/04/2019, 2:21 PM

## 2019-12-04 NOTE — Progress Notes (Signed)
[facility] Progress Note Patient Name: Susan [name] DOB: [birth year] MRN: [id number]   Date of Service  12/04/2019  HPI/Events of Note  Patient very agitated despite fent/versed pushes. Despite posey belt, attempting to pull OG tube and get out of bed.   eICU Interventions  Ordered haldol 5mg  IV x1 dose.     Intervention Category Major Interventions: Delirium, psychosis, severe agitation - evaluation and management  Marily Lente Aerion Bagdasarian 12/04/2019, 2:50 AM

## 2019-12-04 NOTE — Progress Notes (Signed)
NAME:  Susan [name], MRN:  [id number], DOB:  Mar 03, 1961, LOS: 37 ADMISSION DATE:  11/11/2019, CONSULTATION DATE:  3/10 REFERRING MD:  Sabra Heck, CHIEF COMPLAINT:  Acute encephalopathy and respiratory failure    Brief History   59 year old female smoker admitted 3/10 with overdose complicated by aspiration PNA with MRSA,  Pneumococcal bacteremia, toxic metabolic encephalopathy and septic shock.    Past Medical History  Frequent falls at home,Tobacco abuse, diabetes with painful polyneuropathy, bipolar disease, GERD, HIV, schizophrenia, history of IV drug abuse, seizure disorder.   Significant Hospital Events   3/10 Admit with overdose, intubated on arrival, in shock   3/15 Ventilator asynchrony, heavy sedation & intermittent paralysis   3/16 Lasix drip.  Awaiting TEE.  Sputum w/ MRSA.   Coming back down on sedation, changed to [facility]. TEE showing NO EVIDENCE of endocarditis. ABX changed to pen G per ID. MRSA therapy complete. EF improved. Now up to 50% from 20-25% 3/17 Heavily sedated. Having episodes of bradycardia. Still massively overloaded (>14L), increasing lasix gtt to 8 mg from 4, adding metolazone.  3/19 Lasix to 40 every 12, changing RASS goal to -2, decrease in fentanyl, decreasing PEEP, anticipating trialing pressure support ventilation.  Watching upward trend of white blood cell count and low-grade fever 3/20 BM early am. Agitation early am > sedate post am PT psy meds 3/22 Decreased mentation and movement of extremities overnight, sent for Head CT which revealed acute vs subacute bilateral MCA infarcts  3/23 Lying in bed seen spontaneously moving lower and right upper extremity, unable to follow any commands.  3/24 Hypoglycemic episodes overnight, Weaning well 10/5 today, tmax 101.5 Remains on precedex gtt 1.2 3/29 tracheostomy placed  Consults:  ID Cards for TEE Neurology   Procedures:  ETT 3/10 >>  R Fem TLC (ER) 3/10  R IJ TLC 3/10  L IJ TLC 3/10 >> 3/17  RUE PICC 3/16 >>    Significant Diagnostic Tests:   CT brain 3/10 >> Normal head CT  EEG 3/10 >> Moderate to severe diffuse encephalopathy. No seizures  Urine drug screen 3/10 >> positive for THC, acetaminophen Q000111Q, salicylate <7, ETOH Q000111Q  ECHO 3/11 >> EF 20-25%, LV function severely diminished, Global Hypokinesis,LV hypertrophy, Grade 1 Diastolic Dysfunction, Elevated PASP, Dilated LA, MV regurgitation, small mobile density right coronary cusp  TEE 3/16 >> LVEF 55%, No LA/LAA thrombus or masses, Previously noted mass on the aortic valve is a node of Arancius.  Normal variant and no evidence of endocarditis.Trivial TR and trivial PR 3/23 MRI brain >>Acute nonhemorrhagic infarcts in the posterior MCA territory bilaterally. Otherwise no evidence of chronic ischemia. 3/23 MRA head >>No significant intracranial stenosis.  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 3/23 MRV head >> negative  Micro Data:  Influenza A/B 3/10 >> negative  COVID 3/10 >> negative  MRSA PCR 3/10 >> positive  BCx2 3/10 >> streptococcus pneumoniae >> S-rocephin BCx1 (aerobic only) 3/10 >> negative  Tracheal aspirate 3/11 >> MRSA BCx2 3/12 >> negative  BCx2 3/25 >> aerococcus  Resp cx 3/25 >> Klebsiella (R amp)   Antimicrobials:  Vancomycin 3/10 >> 3/16 Unasyn 3/10 >> 3/16 Pen G 3/16 >> 3/23 Cefepime 3/26 >>   Interim history/subjective:  She has been active, somewhat agitated overnight, received some fentanyl and Versed.  Posey belt placed, Haldol x1 given. Had some hypoglycemia overnight, this is been a pattern  Objective   Blood pressure (!) 141/79, pulse 88, temperature 97.7 F (36.5 C), temperature source Axillary,  resp. rate 19, height 5\' 3"  (1.6 m), weight 102.4 kg, SpO2 97 %.    FiO2 (%):  [40 %] 40 %   Intake/Output Summary (Last 24 hours) at 12/04/2019 1314 Last data filed at 12/04/2019 1200 Gross per 24 hour  Intake 1734.15 ml  Output 2975 ml  Net -1240.85 ml   Filed Weights    12/02/19 0500 12/03/19 0500 12/04/19 0424  Weight: 106 kg 106 kg 102.4 kg   Examination:  General: Obese, chronically ill, laying in bed HEENT: Trach in good position, no secretions, no oral lesions Neuro: Opens her eyes, sometimes appears to track but not consistently.  Does not follow commands, very active, moves her arms, legs, shakes her head CV: Distant, regular, no murmur PULM: Clear, decreased both bases, no wheezes GI: Obese, nondistended with positive bowel sounds Extremities: 1+ pitting lower extremity edema Skin: No rashes  Resolved Hospital Problem list   Rhabdomyolysis Elevated INR Septic shock MRSA and aspiration pneumonia, and Pneumococcal bacteremia (ABX competed 3/23)  Assessment & Plan:   Acute hypoxic/hypercapnic respiratory failure with ARDS from MRSA PNA and aspiration pneumonia. Hx of asthma. Trach placed 3/29 Plan: Continue ATC, as tolerated for over 24 hours, attempt to continue ad lib.  Hope to remove MV from the room on 4/3 Continue DuoNeb and albuterol as needed VAP prevention order set Suctioning as needed, standard trach care  Aerococcus bacteremia 3/25 Klebsiella bronchitis/pneumonia 3/25 7-day course of cefepime completed on 4/1   Acute bilateral large bilateral posterior MCA infarcts - 3/23  -TEE 3/16 with no sign of endocarditis  - MRI brain c/w b/l posterior MCA territory infarcts; MRA/ MRV neg Plan: Want to spontaneous movement, intermittently appears to be agitated.  Not always purposeful.  She remains aphasic, debilitated.  Family continues to hope for some meaningful neurological recovery.  Neurology has signed off and noted poor prognosis. Continue PT/OT as she can tolerate Continue aspirin 81 mg  Hx of HIV Plan: Continue home Descovy and [place]   Acute systolic heart failure with sepsis induced CM. Hx of HTN, HLD. Total body volume overload Plan: Continue clonidine, amlodipine, Maxide ACE inhibitor on hold.  Add metoprolol on  4/2 Continue Crestor  Acute metabolic encephalopathy 2nd to sepsis, hypoxia and delirium  Hx of schizophrenia, seizures. Plan: No continuous sedation Has had some intermittent agitation, Haldol given.  Currently on Cogentin, BuSpar, doxepin, Neurontin, Zoloft, Seroquel Posey if needed, try to avoid, minimize benzodiazepines  Acute Kidney Injury 2nd to ATN in setting of septic shock and hypoxia. (improved) Hypokalemia, Hypernatremia - baseline creatinine 0.78 from 06/29/19 Plan: Total body volume overload, dosing furosemide daily, dose again on 4/2 Replace potassium with diuresis Follow BMP, urine output  Constipation. Plan: Continue bowel regimen - scheduled miralax, senokot, prn dulcolax  DM type II poorly controlled with hyperglycemia. - HbA1C 12.3 from 11/11/19 Plan: Plan to decrease Lantus on 4/2 given episodes of hypoglycemia Continue sliding scale insulin with tube feed coverage  At risk malnutrition Planning for PEG tube placement  [place]  Full code.   Best practice:  Diet: tube feeds DVT prophylaxis: SQ heparin GI prophylaxis: protonix Mobility: BR Code Status: full code  Family Communication: Spoke with patient's daughter 4/1. Disposition: to ICU   Labs:   CMP Latest Ref Rng & Units 12/04/2019 12/03/2019 12/02/2019  Glucose 70 - 99 mg/dL 152(H) 77 111(H)  BUN 6 - 20 mg/dL 31(H) 34(H) 39(H)  Creatinine 0.44 - 1.00 mg/dL 1.04(H) 1.03(H) 1.17(H)  Sodium 135 - 145 mmol/L 141  145 144  Potassium 3.5 - 5.1 mmol/L 3.5 3.3(L) 3.0(L)  Chloride 98 - 111 mmol/L 100 102 102  CO2 22 - 32 mmol/L 31 32 32  Calcium 8.9 - 10.3 mg/dL 8.8(L) 8.8(L) 8.5(L)  Total Protein 6.5 - 8.1 g/dL - - -  Total Bilirubin 0.3 - 1.2 mg/dL - - -  Alkaline Phos 38 - 126 U/L - - -  AST 15 - 41 U/L - - -  ALT 0 - 44 U/L - - -    CBC Latest Ref Rng & Units 12/03/2019 12/02/2019 12/01/2019  WBC 4.0 - 10.5 K/uL 12.9(H) 12.1(H) 11.9(H)  Hemoglobin 12.0 - 15.0 g/dL 9.5(L) 8.5(L) 8.1(L)  Hematocrit  36.0 - 46.0 % 31.3(L) 28.5(L) 27.4(L)  Platelets 150 - 400 K/uL 511(H) 506(H) 501(H)    ABG    Component Value Date/Time   PHART 7.517 (H) 11/29/2019 0548   PCO2ART 43.3 11/29/2019 0548   PO2ART 46.0 (L) 11/29/2019 0548   HCO3 35.1 (H) 11/29/2019 0548   TCO2 36 (H) 11/29/2019 0548   ACIDBASEDEF 4.0 (H) 11/12/2019 1504   O2SAT 85.0 11/29/2019 0548    CBG (last 3)  Recent Labs    12/04/19 0428 12/04/19 0811 12/04/19 [place]*       Baltazar Apo, MD, PhD 12/04/2019, 1:14 PM [facility] [phone] or if no answer [phone]

## 2019-12-05 DIAGNOSIS — Z930 Tracheostomy status: Secondary | ICD-10-CM | POA: Diagnosis not present

## 2019-12-05 DIAGNOSIS — B20 Human immunodeficiency virus [HIV] disease: Secondary | ICD-10-CM | POA: Diagnosis not present

## 2019-12-05 DIAGNOSIS — G9341 Metabolic encephalopathy: Secondary | ICD-10-CM | POA: Diagnosis not present

## 2019-12-05 DIAGNOSIS — J9601 Acute respiratory failure with hypoxia: Secondary | ICD-10-CM | POA: Diagnosis not present

## 2019-12-05 LAB — GLUCOSE, CAPILLARY
Glucose-Capillary: 104 mg/dL — ABNORMAL HIGH (ref 70–99)
Glucose-Capillary: 127 mg/dL — ABNORMAL HIGH (ref 70–99)
Glucose-Capillary: 133 mg/dL — ABNORMAL HIGH (ref 70–99)
Glucose-Capillary: 137 mg/dL — ABNORMAL HIGH (ref 70–99)
Glucose-Capillary: 182 mg/dL — ABNORMAL HIGH (ref 70–99)
Glucose-Capillary: 71 mg/dL (ref 70–99)
Glucose-Capillary: 88 mg/dL (ref 70–99)

## 2019-12-05 LAB — BASIC METABOLIC PANEL
Anion gap: 11 (ref 5–15)
BUN: 27 mg/dL — ABNORMAL HIGH (ref 6–20)
CO2: 33 mmol/L — ABNORMAL HIGH (ref 22–32)
Calcium: 8.9 mg/dL (ref 8.9–10.3)
Chloride: 96 mmol/L — ABNORMAL LOW (ref 98–111)
Creatinine, Ser: 0.86 mg/dL (ref 0.44–1.00)
GFR calc Af Amer: >60 mL/min (ref >60)
GFR calc non Af Amer: >60 mL/min (ref >60)
Glucose, Bld: 164 mg/dL — ABNORMAL HIGH (ref 70–99)
Potassium: 3.6 mmol/L (ref 3.5–5.1)
Sodium: 140 mmol/L (ref 135–145)

## 2019-12-05 LAB — POCT I-STAT 7, (LYTES, BLD GAS, ICA,H+H)
Acid-Base Excess: 13 mmol/L — ABNORMAL HIGH (ref 0.0–2.0)
Bicarbonate: 38.7 mmol/L — ABNORMAL HIGH (ref 20.0–28.0)
Calcium, Ion: 1.27 mmol/L (ref 1.15–1.40)
HCT: 31 % — ABNORMAL LOW (ref 36.0–46.0)
Hemoglobin: 10.5 g/dL — ABNORMAL LOW (ref 12.0–15.0)
O2 Saturation: 89 %
Patient temperature: 98.3
Potassium: 3.7 mmol/L (ref 3.5–5.1)
Sodium: 141 mmol/L (ref 135–145)
TCO2: 40 mmol/L — ABNORMAL HIGH (ref 22–32)
pCO2 arterial: 51 mmHg — ABNORMAL HIGH (ref 32.0–48.0)
pH, Arterial: 7.487 — ABNORMAL HIGH (ref 7.350–7.450)
pO2, Arterial: 52 mmHg — ABNORMAL LOW (ref 83.0–108.0)

## 2019-12-05 LAB — MAGNESIUM: Magnesium: 1.9 mg/dL (ref 1.7–2.4)

## 2019-12-05 MED ORDER — INSULIN GLARGINE 100 UNIT/ML ~~LOC~~ SOLN
15.0000 [IU] | Freq: Two times a day (BID) | SUBCUTANEOUS | Status: DC
  Administered 2019-12-06 – 2019-12-10 (×10): 15 [IU] via SUBCUTANEOUS
  Filled 2019-12-05 (×15): qty 0.15

## 2019-12-05 MED ORDER — HALOPERIDOL LACTATE 5 MG/ML IJ SOLN
5.0000 mg | Freq: Once | INTRAMUSCULAR | Status: AC
  Administered 2019-12-05: 5 mg via INTRAVENOUS
  Filled 2019-12-05: qty 1

## 2019-12-05 MED ORDER — CLONIDINE HCL 0.2 MG PO TABS
0.2000 mg | ORAL_TABLET | Freq: Three times a day (TID) | ORAL | Status: DC
  Administered 2019-12-05 – 2020-01-06 (×93): 0.2 mg
  Filled 2019-12-05 (×40): qty 1
  Filled 2019-12-05: qty 2
  Filled 2019-12-05 (×55): qty 1

## 2019-12-05 NOTE — Progress Notes (Signed)
Patient daughter Ms Charna Fathi informed of patient transfer from ICU to 2West room 28.

## 2019-12-05 NOTE — Progress Notes (Signed)
Versed 1mg  IVP given per order via Picc line for continued agitation and restlessness.

## 2019-12-05 NOTE — Progress Notes (Signed)
Pt remains attempting to get oob, pull tubes and lines, not redirectable.

## 2019-12-05 NOTE — Progress Notes (Signed)
[place] Progress Note Patient Name: Susan [name] DOB: 22-Jun-1961 MRN: [id number]   Date of Service  12/05/2019  HPI/Events of Note  Notified of patient being agitated. Already restrained. I am not able to camera in this room but on chart review it seems patient has been receiving haloperidol nightly for the past 2 nights with positive effect. Already on Cogentin, Buspar, Doxepin, Neurontin, Zoloft and Seroquel  eICU Interventions  Ordered a one time dose of haloperidol 5 mg IV     Intervention Category Minor Interventions: Agitation / anxiety - evaluation and management  Judd Lien 12/05/2019, 9:53 PM

## 2019-12-05 NOTE — Plan of Care (Signed)

## 2019-12-05 NOTE — Progress Notes (Signed)
Agitated and restless, unable to follow directions, trying to get out of bed inspite of restraints. VSS, abrile, Trach collar in place with O2. NGT feeding infusing Vital HP @60cc /hr. folet to gravity yellow urine. Patient redirected, repositioned, emotional support provided .

## 2019-12-05 NOTE — Progress Notes (Signed)
NAME:  Susan [name], MRN:  [id number], DOB:  03/18/1961, LOS: 24 ADMISSION DATE:  11/11/2019, CONSULTATION DATE:  3/10 REFERRING MD:  Sabra Heck, CHIEF COMPLAINT:  Acute encephalopathy and respiratory failure    Brief History   59 year old female smoker admitted 3/10 with overdose complicated by aspiration PNA with MRSA,  Pneumococcal bacteremia, toxic metabolic encephalopathy and septic shock.    Past Medical History  Frequent falls at home,Tobacco abuse, diabetes with painful polyneuropathy, bipolar disease, GERD, HIV, schizophrenia, history of IV drug abuse, seizure disorder.   Significant Hospital Events   3/10 Admit with overdose, intubated on arrival, in shock   3/15 Ventilator asynchrony, heavy sedation & intermittent paralysis   3/16 Lasix drip.  Awaiting TEE.  Sputum w/ MRSA.   Coming back down on sedation, changed to [facility]. TEE showing NO EVIDENCE of endocarditis. ABX changed to pen G per ID. MRSA therapy complete. EF improved. Now up to 50% from 20-25% 3/17 Heavily sedated. Having episodes of bradycardia. Still massively overloaded (>14L), increasing lasix gtt to 8 mg from 4, adding metolazone.  3/19 Lasix to 40 every 12, changing RASS goal to -2, decrease in fentanyl, decreasing PEEP, anticipating trialing pressure support ventilation.  Watching upward trend of white blood cell count and low-grade fever 3/20 BM early am. Agitation early am > sedate post am PT psy meds 3/22 Decreased mentation and movement of extremities overnight, sent for Head CT which revealed acute vs subacute bilateral MCA infarcts  3/23 Lying in bed seen spontaneously moving lower and right upper extremity, unable to follow any commands.  3/24 Hypoglycemic episodes overnight, Weaning well 10/5 today, tmax 101.5 Remains on precedex gtt 1.2 3/29 tracheostomy placed  Consults:  ID Cards for TEE Neurology   Procedures:  ETT 3/10 >>  R Fem TLC (ER) 3/10  R IJ TLC 3/10  L IJ TLC 3/10 >> 3/17  RUE PICC 3/16 >>    Significant Diagnostic Tests:   CT brain 3/10 >> Normal head CT  EEG 3/10 >> Moderate to severe diffuse encephalopathy. No seizures  Urine drug screen 3/10 >> positive for THC, acetaminophen Q000111Q, salicylate <7, ETOH Q000111Q  ECHO 3/11 >> EF 20-25%, LV function severely diminished, Global Hypokinesis,LV hypertrophy, Grade 1 Diastolic Dysfunction, Elevated PASP, Dilated LA, MV regurgitation, small mobile density right coronary cusp  TEE 3/16 >> LVEF 55%, No LA/LAA thrombus or masses, Previously noted mass on the aortic valve is a node of Arancius.  Normal variant and no evidence of endocarditis.Trivial TR and trivial PR 3/23 MRI brain >>Acute nonhemorrhagic infarcts in the posterior MCA territory bilaterally. Otherwise no evidence of chronic ischemia. 3/23 MRA head >>No significant intracranial stenosis.  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 3/23 MRV head >> negative  Micro Data:  Influenza A/B 3/10 >> negative  COVID 3/10 >> negative  MRSA PCR 3/10 >> positive  BCx2 3/10 >> streptococcus pneumoniae >> S-rocephin BCx1 (aerobic only) 3/10 >> negative  Tracheal aspirate 3/11 >> MRSA BCx2 3/12 >> negative  BCx2 3/25 >> aerococcus  Resp cx 3/25 >> Klebsiella (R amp)   Antimicrobials:  Vancomycin 3/10 >> 3/16 Unasyn 3/10 >> 3/16 Pen G 3/16 >> 3/23 Cefepime 3/26 >>   Interim history/subjective:  She has been active, somewhat agitated overnight, received some fentanyl and Versed.  Posey belt placed, Haldol x1 given. Had some hypoglycemia overnight, this is been a pattern  Objective   Blood pressure (!) 161/69, pulse 78, temperature 98.3 F (36.8 C), temperature source Axillary,  resp. rate (!) 24, height 5\' 3"  (1.6 m), weight 99.2 kg, SpO2 100 %.    FiO2 (%):  [40 %] 40 %   Intake/Output Summary (Last 24 hours) at 12/05/2019 0930 Last data filed at 12/05/2019 0800 Gross per 24 hour  Intake 1714.05 ml  Output 4510 ml  Net -2795.95 ml   Filed Weights    12/03/19 0500 12/04/19 0424 12/05/19 0400  Weight: 106 kg 102.4 kg 99.2 kg   Examination:  General: Obese, chronically ill, laying in bed HEENT: Trach in good position, no secretions, no oral lesions Neuro: Opens her eyes, sometimes appears to track but not consistently.  Does not follow commands, very active, moves her arms, legs, shakes her head CV: Distant, regular, no murmur PULM: Clear, decreased both bases, no wheezes GI: Obese, nondistended with positive bowel sounds Extremities: 1+ pitting lower extremity edema Skin: No rashes  Resolved Hospital Problem list   Rhabdomyolysis Elevated INR Septic shock MRSA and aspiration pneumonia, and Pneumococcal bacteremia (ABX competed 3/23)  Assessment & Plan:   Acute hypoxic/hypercapnic respiratory failure with ARDS from MRSA PNA and aspiration pneumonia. Hx of asthma. Trach placed 3/29 Plan: As tolerated ATC for over 48 hours.  Should be able to go out of the ICU Continue DuoNeb and albuterol as needed VAP prevention order set Suctioning and standard trach care as needed  Aerococcus bacteremia 3/25 Klebsiella bronchitis/pneumonia 3/25 Completed 7-day course cefepime 4/1   Acute bilateral large bilateral posterior MCA infarcts - 3/23 with severe encephalopathy -TEE 3/16 with no sign of endocarditis  - MRI brain c/w b/l posterior MCA territory infarcts; MRA/ MRV neg Plan: Lots of spontaneous movement with intermittent apparent agitation.  Movement does not appear to be purposeful.  Remains aphasic, debilitated, but possible slight improvement last 48 hours.  Family continues to hope for meaningful neurological recovery. Try to minimize any sedating medication.  Note psychotropic regimen below Continue PT/OT as she can tolerate She will continue to need nursing care, likely SNF with ability to care for her trach Aspirin 81 mg  Hx of HIV Plan: Continue home Descovy, Tivicay  Acute systolic heart failure with sepsis induced  CM. Hx of HTN, HLD. Total body volume overload Plan: Currently on clonidine, amlodipine, Maxide, metoprolol ACE inhibitor on hold Continue Crestor  Acute metabolic encephalopathy 2nd to sepsis, hypoxia and delirium  Hx of schizophrenia, seizures. Plan: Off continuous sedation Has had some intermittent agitation, Haldol given.   Continue current regimen: Cogentin, BuSpar, doxepin, Neurontin, Zoloft, Seroquel Posey if needed, try to avoid, minimize benzodiazepines  Acute Kidney Injury 2nd to ATN in setting of septic shock and hypoxia. (improved) Hypokalemia, Hypernatremia - baseline creatinine 0.78 from 06/29/19 Plan: Total body volume overload, diuresis Dosing furosemide daily, give 40 mg x 1 4/3 Potassium replacement with diuresis Follow urine output, BMP  Constipation. Plan: Continue bowel regimen as ordered- scheduled miralax, senokot, prn dulcolax  DM type II poorly controlled with hyperglycemia. - HbA1C 12.3 from 11/11/19 Plan: Lantus decreased on 4/2, continue same Continue sliding scale insulin with tube feed coverage  At risk malnutrition Planning for PEG placement on Monday 4/5  [place]  Full code  Best practice:  Diet: tube feeds DVT prophylaxis: SQ heparin GI prophylaxis: protonix Mobility: BR Code Status: full code  Family Communication: Spoke with patient's daughter 4/1. Disposition: To progressive care on 4/3  Labs:   CMP Latest Ref Rng & Units 12/05/2019 12/05/2019 12/04/2019  Glucose 70 - 99 mg/dL 164(H) - 152(H)  BUN 6 -  20 mg/dL 27(H) - 31(H)  Creatinine 0.44 - 1.00 mg/dL 0.86 - 1.04(H)  Sodium 135 - 145 mmol/L 140 141 141  Potassium 3.5 - 5.1 mmol/L 3.6 3.7 3.5  Chloride 98 - 111 mmol/L 96(L) - 100  CO2 22 - 32 mmol/L 33(H) - 31  Calcium 8.9 - 10.3 mg/dL 8.9 - 8.8(L)  Total Protein 6.5 - 8.1 g/dL - - -  Total Bilirubin 0.3 - 1.2 mg/dL - - -  Alkaline Phos 38 - 126 U/L - - -  AST 15 - 41 U/L - - -  ALT 0 - 44 U/L - - -    CBC Latest Ref Rng &  Units 12/05/2019 12/03/2019 12/02/2019  WBC 4.0 - 10.5 K/uL - 12.9(H) 12.1(H)  Hemoglobin 12.0 - 15.0 g/dL 10.5(L) 9.5(L) 8.5(L)  Hematocrit 36.0 - 46.0 % 31.0(L) 31.3(L) 28.5(L)  Platelets 150 - 400 K/uL - 511(H) 506(H)    ABG    Component Value Date/Time   PHART 7.487 (H) 12/05/2019 0352   PCO2ART 51.0 (H) 12/05/2019 0352   PO2ART 52.0 (L) 12/05/2019 0352   HCO3 38.7 (H) 12/05/2019 0352   TCO2 40 (H) 12/05/2019 0352   ACIDBASEDEF 4.0 (H) 11/12/2019 1504   O2SAT 89.0 12/05/2019 0352    CBG (last 3)  Recent Labs    12/05/19 0003 12/05/19 0350 12/05/19 [place]       Baltazar Apo, MD, PhD 12/05/2019, 9:30 AM [facility] [phone] or if no answer [phone]

## 2019-12-05 NOTE — Progress Notes (Signed)
[place] Progress Note Patient Name: Susan [name] DOB: [birth year] MRN: [id number]   Date of Service  12/05/2019  HPI/Events of Note  Patient agitated and RN requests restraints  eICU Interventions  Seen on camera and order placed     Intervention Category Minor Interventions: Other:;Routine modifications to care plan (e.Susan. PRN medications for pain, fever)  Susan [name] Susan [name] Susan [name] 12/05/2019, 1:33 AM

## 2019-12-06 DIAGNOSIS — G9341 Metabolic encephalopathy: Secondary | ICD-10-CM | POA: Diagnosis not present

## 2019-12-06 DIAGNOSIS — B20 Human immunodeficiency virus [HIV] disease: Secondary | ICD-10-CM | POA: Diagnosis not present

## 2019-12-06 DIAGNOSIS — I63413 Cerebral infarction due to embolism of bilateral middle cerebral arteries: Secondary | ICD-10-CM | POA: Diagnosis not present

## 2019-12-06 DIAGNOSIS — J9601 Acute respiratory failure with hypoxia: Secondary | ICD-10-CM | POA: Diagnosis not present

## 2019-12-06 LAB — BASIC METABOLIC PANEL
Anion gap: 12 (ref 5–15)
BUN: 23 mg/dL — ABNORMAL HIGH (ref 6–20)
CO2: 30 mmol/L (ref 22–32)
Calcium: 9.2 mg/dL (ref 8.9–10.3)
Chloride: 98 mmol/L (ref 98–111)
Creatinine, Ser: 0.87 mg/dL (ref 0.44–1.00)
GFR calc Af Amer: >60 mL/min (ref >60)
GFR calc non Af Amer: >60 mL/min (ref >60)
Glucose, Bld: 171 mg/dL — ABNORMAL HIGH (ref 70–99)
Potassium: 4.4 mmol/L (ref 3.5–5.1)
Sodium: 140 mmol/L (ref 135–145)

## 2019-12-06 LAB — GLUCOSE, CAPILLARY
Glucose-Capillary: 113 mg/dL — ABNORMAL HIGH (ref 70–99)
Glucose-Capillary: 117 mg/dL — ABNORMAL HIGH (ref 70–99)
Glucose-Capillary: 153 mg/dL — ABNORMAL HIGH (ref 70–99)
Glucose-Capillary: 185 mg/dL — ABNORMAL HIGH (ref 70–99)
Glucose-Capillary: 211 mg/dL — ABNORMAL HIGH (ref 70–99)
Glucose-Capillary: 232 mg/dL — ABNORMAL HIGH (ref 70–99)

## 2019-12-06 LAB — MAGNESIUM: Magnesium: 1.9 mg/dL (ref 1.7–2.4)

## 2019-12-06 NOTE — Progress Notes (Signed)
Dr. Bedelia Person paged and made aware of ongoiung behavior= will endorse to day shift for possible psychiatric consult. Continued in restraints,  Removed Q2 hrs and repositioned: continued agitation.

## 2019-12-06 NOTE — Progress Notes (Signed)
Thus far no result from versed or Haldol, patient quiet for short period then rtesumed agitation and restlessness.

## 2019-12-06 NOTE — Progress Notes (Signed)
Called AC to see if a safety sitter may be available for pt. AC will check for sitter availability.

## 2019-12-06 NOTE — Progress Notes (Signed)
PROGRESS NOTE  Susan [name]:[id number] DOB: 21-Jun-1961 DOA: 11/11/2019 PCP: Hoyt Koch, MD  HPI/Recap of past 24 hours: HPI from PCCM 59 year old female with medical history significant for HIV, diabetes with neuropathy, seizure disorder, tobacco abuse, bipolar disease, schizophrenia, history of IV drug abuse admitted 3/10 to ICU with overdose complicated by aspiration pneumonia with MRSA, pneumococcal bacteremia, toxic metabolic encephalopathy, septic shock and acute versus subacute bilateral MCA infarcts. Patient was intubated on 3/10, difficult to wean off, tracheostomy placed on 3/29.  [facility] hospital[facility] assumed care on 12/06/2019.    Overnight, patient continues to remain restless, agitated, trying to get out of bed.  Today, patient continues to remain restless, agitated, lots of spontaneous movements, remains aphasic, unable to communicate effectively, requiring one-to-one sitter.   Assessment/Plan: Principal Problem:   Acute respiratory failure with hypoxemia (HCC) Active Problems:   HIV disease (HCC)   Drug-induced hypotension   AKI (acute kidney injury) ([place])   Pneumococcal bacteremia   Pneumonia   Aortic valve endocarditis   Acute kidney injury (AKI) with acute tubular necrosis (ATN) (HCC)   Acute metabolic encephalopathy   Cerebral embolism with cerebral infarction   Status post tracheostomy ([place])   Acute hypoxic/hypercapnic respiratory failure with ARDS from MRSA PNA and aspiration pneumonia Hx of asthma Intubated on 3/10, difficult to wean off, status post trach on 3/29 Currently on trach collar with 5 L of O2 Continue Pulmicort nebulizer, DuoNeb as needed VAP prevention order set Suctioning and standard trach care as needed  Aerococcus bacteremia 3/25 Klebsiella bronchitis/pneumonia 3/25 Completed 7-day course cefepime on 4/1   Acute bilateral large bilateral posterior MCA infarcts - 3/23 with severe encephalopathy TEE 3/16 with no sign of  endocarditis  MRI brain c/w b/l posterior MCA territory infarcts; MRA/ MRV neg Continue aspirin, Crestor PT/OT/SLP  Acute metabolic encephalopathy/hospital delirium Multifactorial, including sepsis, hypoxia, with underlying history of schizophrenia/seizures Persistent agitation as mentioned above Continue current regimen: Cogentin, BuSpar, doxepin, Neurontin, Zoloft, Seroquel Posey if needed, try to avoid/minimize benzodiazepines May need psychiatry/neurology evaluation  Acute systolic heart failure with sepsis induced CM Hx of HTN, HLD Currently on clonidine, amlodipine, maxide, metoprolol ACE inhibitor on hold Continue Crestor  DM type II poorly controlled HbA1C 12.3 from 11/11/19 SSI, lantus, hypoglycemic protocol, Accu-Cheks  HIV Continue home Descovy, Tivicay  At risk malnutrition Planning for PEG placement on Monday 4/5 Continue tube feeds  Obesity Lifestyle modification advised once able to understand  [place] Very poor prognosis due to multiple comorbidities Palliative consulted          Malnutrition Type:  Nutrition Problem: Inadequate oral intake Etiology: inability to eat   Malnutrition Characteristics:  Signs/Symptoms: NPO status   Nutrition Interventions:  Interventions: Tube feeding    Estimated body mass index is 39.09 kg/m as calculated from the following:   Height as of this encounter: 5' 3"  (1.6 m).   Weight as of this encounter: 100.1 kg.     Code Status: Full  Family Communication: Will plan to discuss with family  Disposition Plan: Pending PT/OT reevaluation, likely placement at [facility] once management is complete here in the acute setting.  TOC notified.  Not medically stable for discharge   Consultants:  PCCM  ID  Neurology  Procedures:  Mechanical ventilation  TEE  Antimicrobials:  None  DVT prophylaxis: [place] Heparin   Objective: Vitals:   12/06/19 0343 12/06/19 0347 12/06/19 0811 12/06/19 1020  BP:   140/71  (!) 174/97  Pulse: 78 79 83 87  Resp: 20 18 18 16   Temp:  98.3 F (36.8 C)    TempSrc:  Oral    SpO2: 98% 99% 92% 92%  Weight:  100.1 kg    Height:        Intake/Output Summary (Last 24 hours) at 12/06/2019 1104 Last data filed at 12/06/2019 0300 Gross per 24 hour  Intake 1213.52 ml  Output 2000 ml  Net -786.48 ml   Filed Weights   12/05/19 0400 12/05/19 2200 12/06/19 0347  Weight: 99.2 kg 100.1 kg 100.1 kg    Exam:  General:  Restless, agitated, chronically ill-appearing  Cardiovascular: S1, S2 present  Respiratory:  Trach collar in good position, decreased breath sounds at the bases  Abdomen: Soft, nontender, nondistended, bowel sounds present  Musculoskeletal: 1+ bilateral pedal edema noted  Skin: Normal  Psychiatry:  Unable to assess  Neurology: Patient does not follow commands, spontaneous movements of all extremities, nonverbal    Data Reviewed: CBC: Recent Labs  Lab 12/01/19 0452 12/02/19 0511 12/03/19 0350 12/05/19 0352  WBC 11.9* 12.1* 12.9*  --   NEUTROABS  --   --  7.4  --   HGB 8.1* 8.5* 9.5* 10.5*  HCT 27.4* 28.5* 31.3* 31.0*  MCV 92.3 92.5 89.9  --   PLT 501* 506* 511*  --    Basic Metabolic Panel: Recent Labs  Lab 12/02/19 0511 12/02/19 0511 12/03/19 0350 12/04/19 0433 12/05/19 0352 12/05/19 0429 12/06/19 0337  NA 144   < > 145 141 141 140 140  K 3.0*   < > 3.3* 3.5 3.7 3.6 4.4  CL 102  --  102 100  --  96* 98  CO2 32  --  32 31  --  33* 30  GLUCOSE 111*  --  77 152*  --  164* 171*  BUN 39*  --  34* 31*  --  27* 23*  CREATININE 1.17*  --  1.03* 1.04*  --  0.86 0.87  CALCIUM 8.5*  --  8.8* 8.8*  --  8.9 9.2  MG 1.9  --   --  2.0  --  1.9 1.9  PHOS 2.5  --   --   --   --   --   --    < > = values in this interval not displayed.   GFR: Estimated Creatinine Clearance: 80.5 mL/min (by C-G formula based on SCr of 0.87 mg/dL). Liver Function Tests: No results for input(s): AST, ALT, ALKPHOS, BILITOT, PROT, ALBUMIN in the  last 168 hours. No results for input(s): LIPASE, AMYLASE in the last 168 hours. No results for input(s): AMMONIA in the last 168 hours. Coagulation Profile: Recent Labs  Lab 11/30/19 1103  INR 1.2   Cardiac Enzymes: No results for input(s): CKTOTAL, CKMB, CKMBINDEX, TROPONINI in the last 168 hours. BNP (last 3 results) No results for input(s): PROBNP in the last 8760 hours. HbA1C: No results for input(s): HGBA1C in the last 72 hours. CBG: Recent Labs  Lab 12/05/19 1722 12/05/19 1945 12/05/19 2340 12/06/19 0358 12/06/19 0833  GLUCAP 104* 71 127* 153* 113*   Lipid Profile: No results for input(s): CHOL, HDL, LDLCALC, TRIG, CHOLHDL, LDLDIRECT in the last 72 hours. Thyroid Function Tests: No results for input(s): TSH, T4TOTAL, FREET4, T3FREE, THYROIDAB in the last 72 hours. Anemia Panel: No results for input(s): VITAMINB12, FOLATE, FERRITIN, TIBC, IRON, RETICCTPCT in the last 72 hours. Urine analysis:    Component Value Date/Time   COLORURINE YELLOW 11/26/2019 [place]  11/26/2019 1522   LABSPEC 1.011 11/26/2019 1522   PHURINE 8.0 11/26/2019 1522   GLUCOSEU NEGATIVE 11/26/2019 1522   HGBUR NEGATIVE 11/26/2019 1522   BILIRUBINUR NEGATIVE 11/26/2019 1522   [place] 11/26/2019 1522   PROTEINUR 30 (A) 11/26/2019 1522   UROBILINOGEN 1.0 06/01/2015 0104   NITRITE NEGATIVE 11/26/2019 1522   LEUKOCYTESUR NEGATIVE 11/26/2019 1522   Sepsis Labs: @LABRCNTIP (procalcitonin:4,lacticidven:4)  ) Recent Results (from the past 240 hour(s))  Culture, blood (Routine X 2) w Reflex to ID Panel     Status: Abnormal   Collection Time: 11/26/19  2:32 PM   Specimen: BLOOD LEFT HAND  Result Value Ref Range Status   Specimen Description BLOOD LEFT HAND  Final   Special Requests (A)  Final    AEROCOCCUS SPECIES Blood Culture results may not be optimal due to an inadequate volume of blood received in culture bottles   Culture   Final    NO GROWTH 5 DAYS Performed  at [facility], [place] [address] [place], [place] [address]    Report Status 12/01/2019 FINAL  Final  Culture, blood (Routine X 2) w Reflex to ID Panel     Status: Abnormal   Collection Time: 11/26/19  2:32 PM   Specimen: Left Antecubital; Blood  Result Value Ref Range Status   Specimen Description LEFT ANTECUBITAL  Final   Special Requests AEROCOCCUS SPECIES Blood Culture adequate volume (A)  Final   Culture   Final    NO GROWTH 5 DAYS Performed at [facility], [place] [address] [place], [place] [address]    Report Status 12/01/2019 FINAL  Final  Culture, respiratory (non-expectorated)     Status: None   Collection Time: 11/26/19  5:12 PM   Specimen: Tracheal Aspirate; Respiratory  Result Value Ref Range Status   Specimen Description TRACHEAL ASPIRATE  Final   Special Requests NONE  Final   Gram Stain   Final    MODERATE WBC PRESENT, PREDOMINANTLY PMN RARE GRAM NEGATIVE RODS Performed at [facility], [place] [address] [place], [place] [address]    Culture FEW KLEBSIELLA OXYTOCA  Final   Report Status 11/28/2019 FINAL  Final   Organism ID, Bacteria KLEBSIELLA OXYTOCA  Final      Susceptibility   Klebsiella oxytoca - MIC*    AMPICILLIN >=32 RESISTANT Resistant     CEFAZOLIN <=4 SENSITIVE Sensitive     CEFEPIME <=0.12 SENSITIVE Sensitive     CEFTAZIDIME <=1 SENSITIVE Sensitive     CEFTRIAXONE <=0.25 SENSITIVE Sensitive     CIPROFLOXACIN <=0.25 SENSITIVE Sensitive     GENTAMICIN <=1 SENSITIVE Sensitive     IMIPENEM <=0.25 SENSITIVE Sensitive     TRIMETH/SULFA <=20 SENSITIVE Sensitive     AMPICILLIN/SULBACTAM 4 SENSITIVE Sensitive     PIP/TAZO <=4 SENSITIVE Sensitive     * FEW KLEBSIELLA OXYTOCA      Studies: No results found.  Scheduled Meds: . amLODipine  10 mg Per Tube Daily  . aspirin  81 mg Per Tube Daily  . benztropine  0.5 mg Per Tube Daily  . budesonide (PULMICORT) nebulizer solution  0.5 mg Nebulization BID  . busPIRone  15 mg Per Tube TID  .  chlorhexidine gluconate (MEDLINE KIT)  15 mL Mouth Rinse BID  . Chlorhexidine Gluconate Cloth  6 each Topical Daily  . cloNIDine  0.2 mg Per Tube TID  . dolutegravir  50 mg Oral Daily  . doxepin  50 mg Per Tube QHS  .  emtricitabine-tenofovir AF  1 tablet Per Tube Daily  . feeding supplement (VITAL HIGH PROTEIN)  1,000 mL Per Tube Q24H  . free water  200 mL Per Tube Q4H  . gabapentin  300 mg Per Tube Q8H  . heparin injection (subcutaneous)  5,000 Units Subcutaneous Q8H  . insulin aspart  0-20 Units Subcutaneous Q4H  . insulin aspart  10 Units Subcutaneous Q4H  . insulin glargine  15 Units Subcutaneous BID  . mouth rinse  15 mL Mouth Rinse 10 times per day  . metoprolol tartrate  12.5 mg Per Tube BID  . midazolam  2 mg Intravenous Once  . pantoprazole sodium  40 mg Per Tube Daily  . polyethylene glycol  17 g Oral Daily  . QUEtiapine  200 mg Per Tube BID  . rosuvastatin  20 mg Per Tube q1800  . sennosides  15 mL Per Tube BID  . sertraline  50 mg Per Tube Daily  . triamterene-hydrochlorothiazide  1 tablet Per Tube Daily    Continuous Infusions: . sodium chloride 10 mL/hr at 12/05/19 0800  . [START ON 12/07/2019]  ceFAZolin (ANCEF) IV       LOS: 25 days     Alma Friendly, MD [facility]  If 7PM-7AM, please contact night-coverage [email or web address] 12/06/2019, 11:04 AM

## 2019-12-07 ENCOUNTER — Inpatient Hospital Stay (HOSPITAL_COMMUNITY): Payer: Medicare Other

## 2019-12-07 ENCOUNTER — Other Ambulatory Visit: Payer: Self-pay | Admitting: Internal Medicine

## 2019-12-07 DIAGNOSIS — Z515 Encounter for palliative care: Secondary | ICD-10-CM

## 2019-12-07 DIAGNOSIS — J9601 Acute respiratory failure with hypoxia: Secondary | ICD-10-CM | POA: Diagnosis not present

## 2019-12-07 DIAGNOSIS — R627 Adult failure to thrive: Secondary | ICD-10-CM

## 2019-12-07 DIAGNOSIS — G9341 Metabolic encephalopathy: Secondary | ICD-10-CM | POA: Diagnosis not present

## 2019-12-07 DIAGNOSIS — Z7189 Other specified counseling: Secondary | ICD-10-CM | POA: Diagnosis not present

## 2019-12-07 DIAGNOSIS — I63413 Cerebral infarction due to embolism of bilateral middle cerebral arteries: Secondary | ICD-10-CM | POA: Diagnosis not present

## 2019-12-07 DIAGNOSIS — B20 Human immunodeficiency virus [HIV] disease: Secondary | ICD-10-CM | POA: Diagnosis not present

## 2019-12-07 HISTORY — PX: IR GASTROSTOMY TUBE MOD SED: IMG625

## 2019-12-07 LAB — BASIC METABOLIC PANEL
Anion gap: 13 (ref 5–15)
BUN: 24 mg/dL — ABNORMAL HIGH (ref 6–20)
CO2: 30 mmol/L (ref 22–32)
Calcium: 8.7 mg/dL — ABNORMAL LOW (ref 8.9–10.3)
Chloride: 98 mmol/L (ref 98–111)
Creatinine, Ser: 0.84 mg/dL (ref 0.44–1.00)
GFR calc Af Amer: >60 mL/min (ref >60)
GFR calc non Af Amer: >60 mL/min (ref >60)
Glucose, Bld: 135 mg/dL — ABNORMAL HIGH (ref 70–99)
Potassium: 4.3 mmol/L (ref 3.5–5.1)
Sodium: 141 mmol/L (ref 135–145)

## 2019-12-07 LAB — CBC WITH DIFFERENTIAL/PLATELET
Abs Immature Granulocytes: 0.08 10*3/uL — ABNORMAL HIGH (ref 0.00–0.07)
Basophils Absolute: 0.1 10*3/uL (ref 0.0–0.1)
Basophils Relative: 1 %
Eosinophils Absolute: 0.7 10*3/uL — ABNORMAL HIGH (ref 0.0–0.5)
Eosinophils Relative: 6 %
HCT: 32.2 % — ABNORMAL LOW (ref 36.0–46.0)
Hemoglobin: 9.9 g/dL — ABNORMAL LOW (ref 12.0–15.0)
Immature Granulocytes: 1 %
Lymphocytes Relative: 22 %
Lymphs Abs: 2.7 10*3/uL (ref 0.7–4.0)
MCH: 27.4 pg (ref 26.0–34.0)
MCHC: 30.7 g/dL (ref 30.0–36.0)
MCV: 89.2 fL (ref 80.0–100.0)
Monocytes Absolute: 0.9 10*3/uL (ref 0.1–1.0)
Monocytes Relative: 7 %
Neutro Abs: 8 10*3/uL — ABNORMAL HIGH (ref 1.7–7.7)
Neutrophils Relative %: 63 %
Platelets: 446 10*3/uL — ABNORMAL HIGH (ref 150–400)
RBC: 3.61 MIL/uL — ABNORMAL LOW (ref 3.87–5.11)
RDW: 19.3 % — ABNORMAL HIGH (ref 11.5–15.5)
WBC: 12.4 10*3/uL — ABNORMAL HIGH (ref 4.0–10.5)
nRBC: 0 % (ref 0.0–0.2)

## 2019-12-07 LAB — GLUCOSE, CAPILLARY
Glucose-Capillary: 118 mg/dL — ABNORMAL HIGH (ref 70–99)
Glucose-Capillary: 174 mg/dL — ABNORMAL HIGH (ref 70–99)
Glucose-Capillary: 161 mg/dL — ABNORMAL HIGH (ref 70–99)
Glucose-Capillary: 104 mg/dL — ABNORMAL HIGH (ref 70–99)
Glucose-Capillary: 153 mg/dL — ABNORMAL HIGH (ref 70–99)

## 2019-12-07 LAB — PROTIME-INR
INR: 1 (ref 0.8–1.2)
Prothrombin Time: 13 seconds (ref 11.4–15.2)

## 2019-12-07 IMAGING — XA IR PERC PLACEMENT GASTROSTOMY
3 series · 4 of 4 positions shown · non-contrast
Comparison: none

INDICATION: 58-year-old female with a history of dysphagia

[Series 1: fl (-) angio · 1 of 1 slices shown (1 of 3)]
[im 1/1]
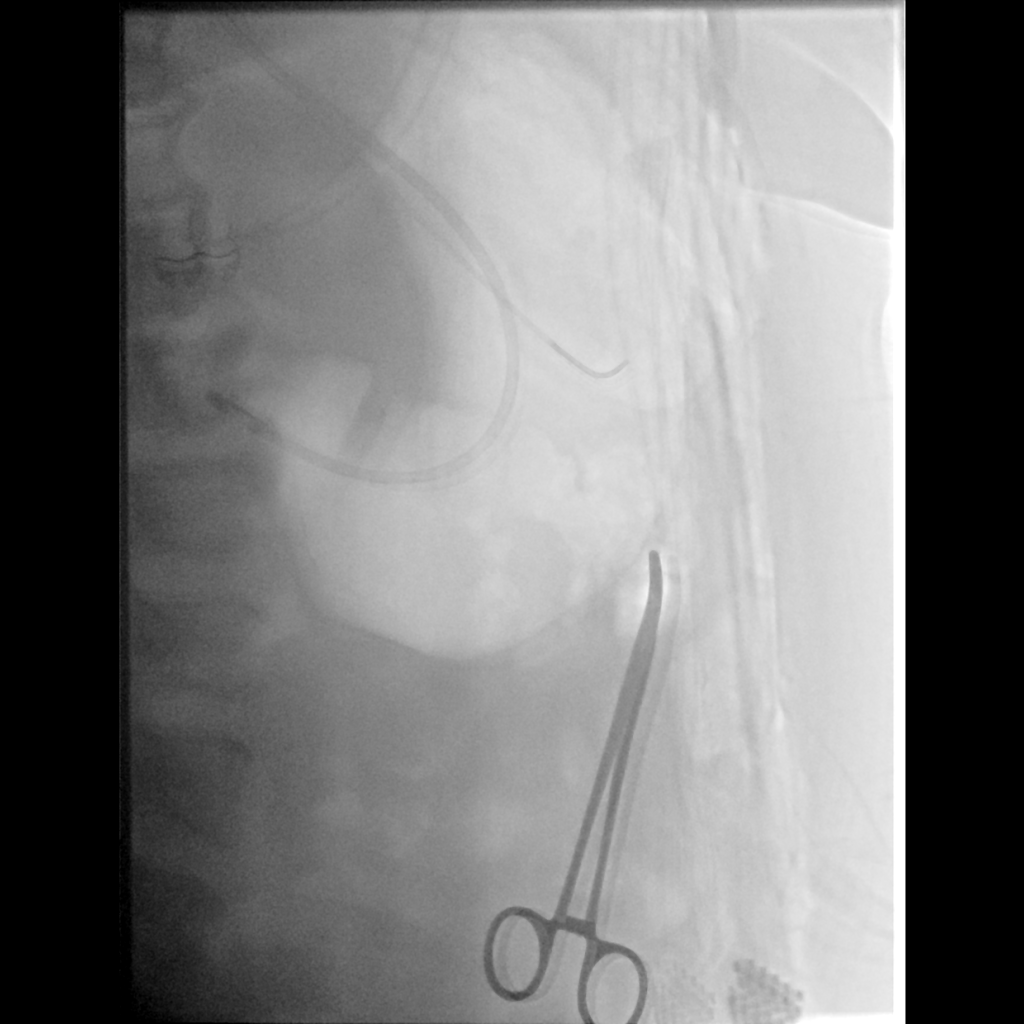

[Series 2: fl (-) angio · 1 of 1 slices shown (2 of 3)]
[im 1/1]
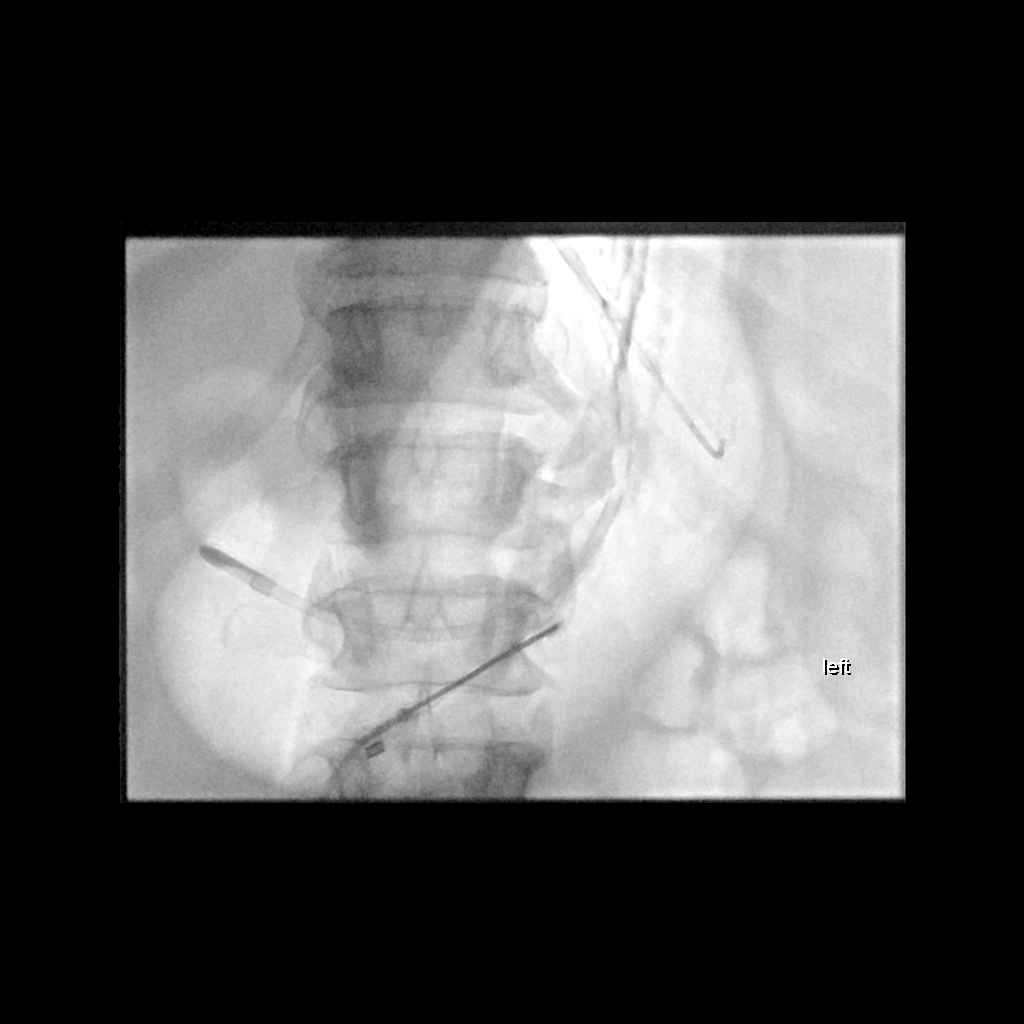

[Series 3: fl (-) angio · 2 of 2 slices shown (3 of 3)]
[im 1/2]
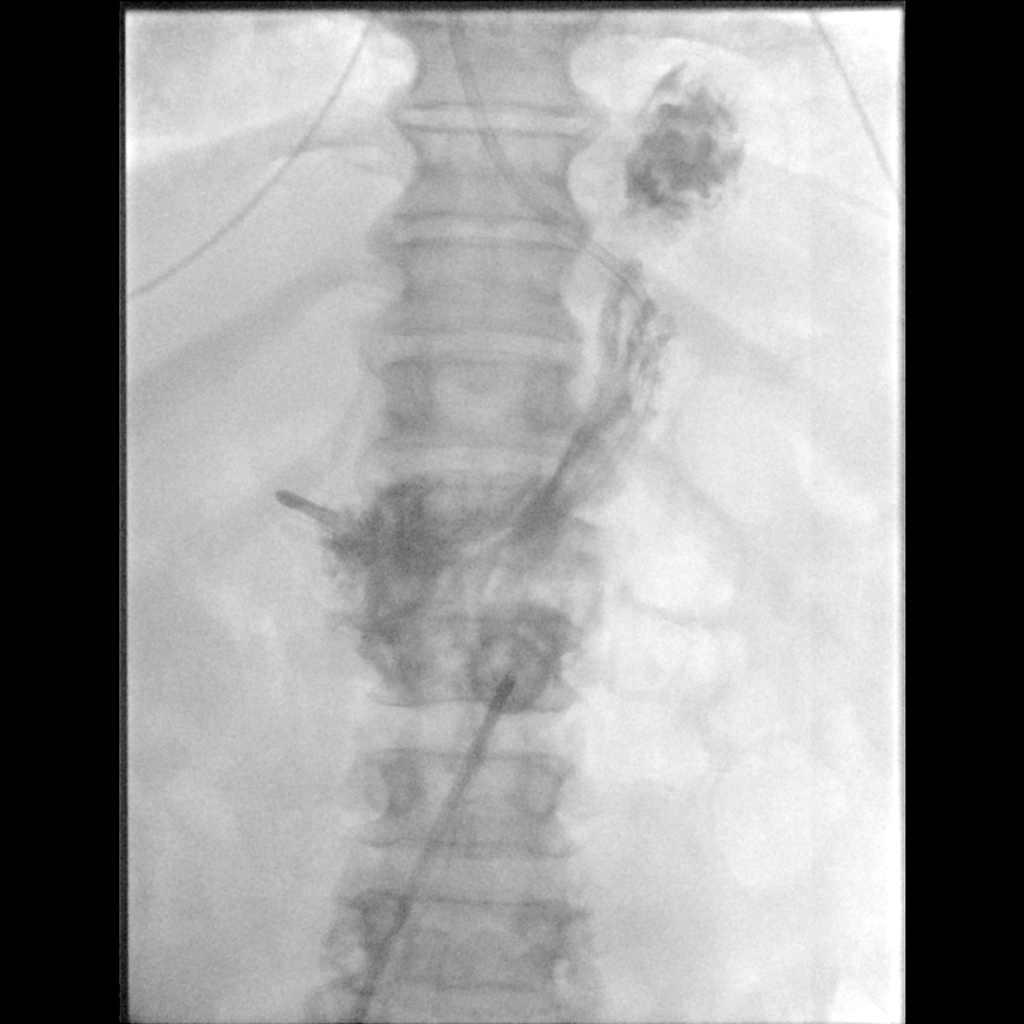
[im 2/2]
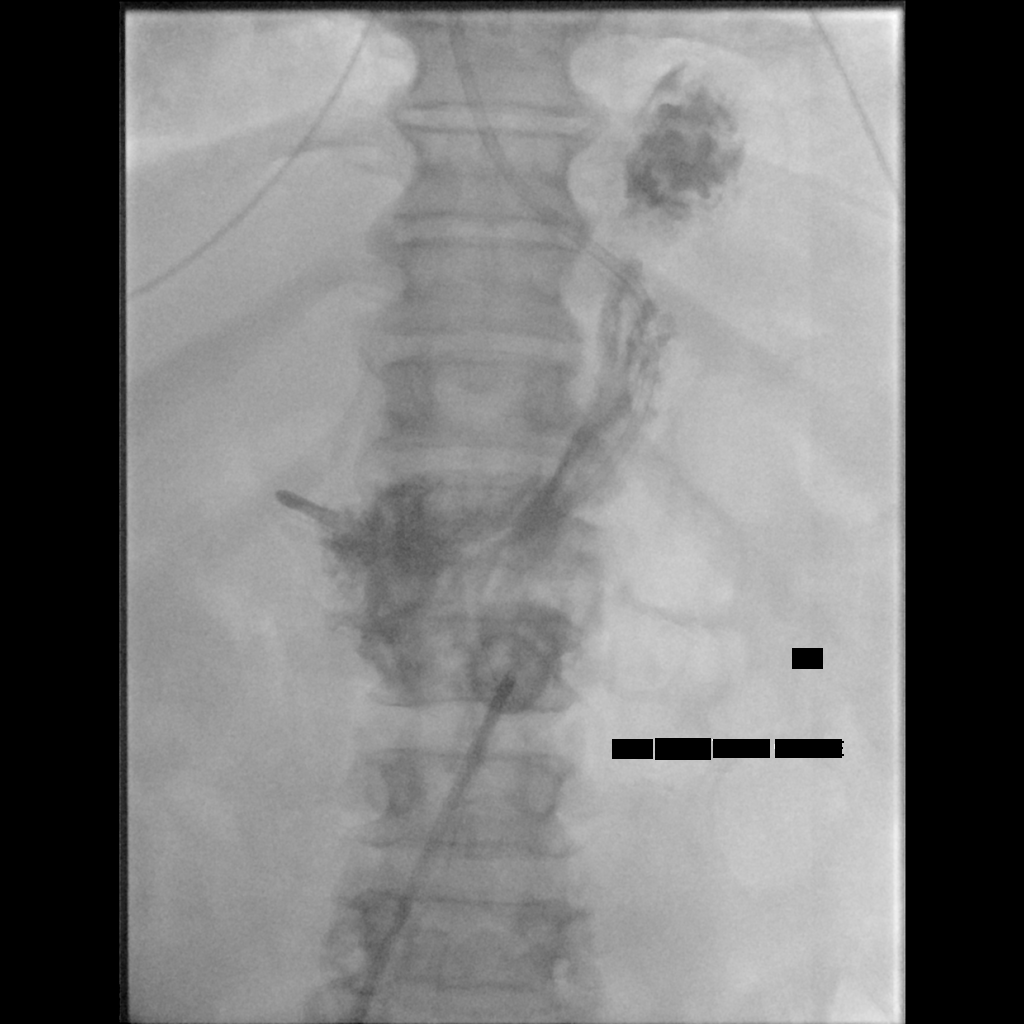

[4 of 4 positions shown; findings below may reference images not displayed]

EXAM:
PERC PLACEMENT GASTROSTOMY

MEDICATIONS:
2 g Ancef; Antibiotics were administered within 1 hour of the
procedure.

ANESTHESIA/SEDATION:
Versed 1.0 mg IV; Fentanyl 50 mcg IV

Moderate Sedation Time:  10 minutes

The patient was continuously monitored during the procedure by the
interventional radiology nurse under my direct supervision.

CONTRAST:  20mL OMNIPAQUE IOHEXOL 300 MG/ML SOLN - administered into
the gastric lumen.

FLUOROSCOPY TIME:  Fluoroscopy Time: 3 minutes 54 seconds (51 mGy).

COMPLICATIONS:
None

PROCEDURE:
Informed written consent was obtained from the patient and the
patient's family after a thorough discussion of the procedural
risks, benefits and alternatives. All questions were addressed.
Maximal Sterile Barrier Technique was utilized including caps, mask,
sterile gowns, sterile gloves, sterile drape, hand hygiene and skin
antiseptic. A timeout was performed prior to the initiation of the
procedure.

The epigastrium was prepped with Betadine in a sterile fashion, and
a sterile drape was applied covering the operative field. A sterile
gown and sterile gloves were used for the procedure.

A 5-French orogastric tube is placed under fluoroscopic guidance.
Scout imaging of the abdomen confirms barium within the transverse
colon.

The stomach was distended with gas. Under fluoroscopic guidance, an
18 gauge needle was utilized to puncture the anterior wall of the
body of the stomach. An Amplatz wire was advanced through the needle
passing a T fastener into the lumen of the stomach. The T fastener
was secured for gastropexy. A 9-French sheath was inserted.

A snare was advanced through the 9-French sheath. ORUI was
advanced through the orogastric tube. It was snared then pulled out
the oral cavity, pulling the snare, as well. The leading edge of the
gastrostomy was attached to the snare. It was then pulled down the
esophagus and out the percutaneous site. Tube secured in place.
Contrast was injected.

Patient tolerated the procedure well and remained hemodynamically
stable throughout.

No complications were encountered and no significant blood loss
encountered.
IMPRESSION: Status post fluoroscopic placed percutaneous gastrostomy tube, with
20 French pull-through.

## 2019-12-07 MED ORDER — MIDAZOLAM HCL 2 MG/2ML IJ SOLN
INTRAMUSCULAR | Status: AC | PRN
  Administered 2019-12-07: 1 mg via INTRAVENOUS

## 2019-12-07 MED ORDER — FENTANYL CITRATE (PF) 100 MCG/2ML IJ SOLN
INTRAMUSCULAR | Status: AC
  Filled 2019-12-07: qty 2

## 2019-12-07 MED ORDER — ORAL CARE MOUTH RINSE
15.0000 mL | Freq: Two times a day (BID) | OROMUCOSAL | Status: DC
  Administered 2019-12-07 – 2019-12-15 (×17): 15 mL via OROMUCOSAL

## 2019-12-07 MED ORDER — FENTANYL CITRATE (PF) 100 MCG/2ML IJ SOLN
INTRAMUSCULAR | Status: AC | PRN
  Administered 2019-12-07: 50 ug via INTRAVENOUS

## 2019-12-07 MED ORDER — BACITRACIN-NEOMYCIN-POLYMYXIN 400-5-5000 EX OINT
1.0000 "application " | TOPICAL_OINTMENT | Freq: Every day | CUTANEOUS | Status: AC
  Administered 2019-12-08 – 2019-12-13 (×5): 1 via TOPICAL
  Filled 2019-12-07 (×3): qty 1

## 2019-12-07 MED ORDER — CEFAZOLIN SODIUM-DEXTROSE 2-4 GM/100ML-% IV SOLN
INTRAVENOUS | Status: AC
  Filled 2019-12-07: qty 100

## 2019-12-07 MED ORDER — LIDOCAINE HCL 1 % IJ SOLN
INTRAMUSCULAR | Status: AC
  Filled 2019-12-07: qty 20

## 2019-12-07 MED ORDER — LIDOCAINE HCL 1 % IJ SOLN
INTRAMUSCULAR | Status: AC | PRN
  Administered 2019-12-07: 10 mL

## 2019-12-07 MED ORDER — CHLORHEXIDINE GLUCONATE 0.12 % MT SOLN
15.0000 mL | Freq: Two times a day (BID) | OROMUCOSAL | Status: DC
  Administered 2019-12-07 – 2019-12-16 (×18): 15 mL via OROMUCOSAL
  Filled 2019-12-07 (×2): qty 15

## 2019-12-07 MED ORDER — IOHEXOL 300 MG/ML  SOLN
50.0000 mL | Freq: Once | INTRAMUSCULAR | Status: AC | PRN
  Administered 2019-12-07: 20 mL

## 2019-12-07 MED ORDER — CEFAZOLIN (ANCEF) 1 G IV SOLR
INTRAVENOUS | Status: AC | PRN
  Administered 2019-12-07: 2 g

## 2019-12-07 MED ORDER — MIDAZOLAM HCL 2 MG/2ML IJ SOLN
INTRAMUSCULAR | Status: AC
  Filled 2019-12-07: qty 2

## 2019-12-07 NOTE — Progress Notes (Signed)
Versed 1mg  not given after review with RRT, will consider haldol PRN as needed for continued agitation and restlessness. At present patient is asleep, easily awaken.VSS.

## 2019-12-07 NOTE — Progress Notes (Addendum)
PROGRESS NOTE  Susan [name] [id number] DOB: Jan 12, 1961 DOA: 11/11/2019 PCP: Hoyt Koch, MD  HPI/Recap of past 24 hours: HPI from PCCM 59 year old female with medical history significant for HIV, diabetes with neuropathy, seizure disorder, tobacco abuse, bipolar disease, schizophrenia, history of IV drug abuse admitted 3/10 to ICU with overdose complicated by aspiration pneumonia with MRSA, pneumococcal bacteremia, toxic metabolic encephalopathy, septic shock and acute versus subacute bilateral MCA infarcts. Patient was intubated on 3/10, difficult to wean off, tracheostomy placed on 3/29.  [facility] assumed care on 12/06/2019.    Today, patient still restless, nonverbal, unable to communicate, does not track.  Unable to perform ROS    Assessment/Plan: Principal Problem:   Acute respiratory failure with hypoxemia (HCC) Active Problems:   HIV disease (HCC)   Drug-induced hypotension   AKI (acute kidney injury) ([place])   Pneumococcal bacteremia   Pneumonia   Aortic valve endocarditis   Acute kidney injury (AKI) with acute tubular necrosis (ATN) (HCC)   Acute metabolic encephalopathy   Cerebral embolism with cerebral infarction   Status post tracheostomy ([place])   Acute hypoxic/hypercapnic respiratory failure with ARDS from MRSA PNA and aspiration pneumonia Hx of asthma Intubated on 3/10, difficult to wean off, status post trach on 3/29 Currently on trach collar with 5 L of O2 Continue Pulmicort nebulizer, DuoNeb as needed VAP prevention order set Suctioning and standard trach care as needed  Aerococcus bacteremia 3/25 Klebsiella bronchitis/pneumonia 3/25 Persistent leukocytosis Completed 7-day course cefepime on 4/1   Acute bilateral large bilateral posterior MCA infarcts - 3/23 with severe encephalopathy TEE 3/16 with no sign of endocarditis  MRI brain c/w b/l posterior MCA territory infarcts; MRA/ MRV neg Continue aspirin, Crestor PT/OT/SLP  Acute  metabolic encephalopathy/hospital delirium Multifactorial, including sepsis, hypoxia, with underlying history of schizophrenia/seizures, anoxic brain injury Persistent agitation as mentioned above Continue current regimen: Cogentin, BuSpar, doxepin, Neurontin, Zoloft, Seroquel Posey if needed, try to avoid/minimize benzodiazepines  Acute systolic heart failure with sepsis induced CM Hx of HTN, HLD Currently on clonidine, amlodipine, maxide, metoprolol ACE inhibitor on hold Continue Crestor  DM type II poorly controlled HbA1C 12.3 from 11/11/19 SSI, lantus, hypoglycemic protocol, Accu-Cheks  HIV Continue home Descovy, [place]  At risk malnutrition S/p PEG placement on Monday 4/5, by IR, plan to start using hopefully 12/08/19 Continue tube feeds  Obesity Lifestyle modification advised once able to understand  [place] Very poor prognosis due to multiple comorbidities Palliative consulted-daughter with very poor insight, plans to take patient home once " stable"          Malnutrition Type:  Nutrition Problem: Inadequate oral intake Etiology: inability to eat   Malnutrition Characteristics:  Signs/Symptoms: NPO status   Nutrition Interventions:  Interventions: Tube feeding    Estimated body mass index is 37.84 kg/m as calculated from the following:   Height as of this encounter: 5\' 3"  (1.6 m).   Weight as of this encounter: 96.9 kg.     Code Status: Full  Family Communication: Will plan to discuss with family  Disposition Plan: Will need placement at [facility] once management is complete here in the acute setting.  TOC notified. Daughter with very poor insight, plans to take patient home once " stable"    Consultants:  PCCM  ID  Neurology  Procedures:  Mechanical ventilation  TEE  Antimicrobials:  None  DVT prophylaxis: [place] Heparin   Objective: Vitals:   12/07/19 1227 12/07/19 1555 12/07/19 1616 12/07/19 1620  BP: 135/70 140/82 140/72  [name])  143/81  Pulse:  82 81 78  Resp: (!) 24 19 (!) 21 (!) 21  Temp: 99 F (37.2 C)     TempSrc: Oral     SpO2: 97% 99% 98% 99%  Weight:      Height:        Intake/Output Summary (Last 24 hours) at 12/07/2019 1646 Last data filed at 12/07/2019 1500 Gross per 24 hour  Intake 591.31 ml  Output 12776 ml  Net -12184.69 ml   Filed Weights   12/05/19 2200 12/06/19 0347 12/07/19 0400  Weight: 100.1 kg 100.1 kg 96.9 kg    Exam:  General:  Restless, chronically ill-appearing, nonverbal, anoxic brain injury  Cardiovascular: S1, S2 present  Respiratory:  Trach collar in good position, decreased breath sounds at the bases  Abdomen: Soft, nontender, nondistended, bowel sounds present  Musculoskeletal: 1+ bilateral pedal edema noted  Skin: Normal  Psychiatry:  Unable to assess  Neurology: Patient does not follow commands, does not track appropriately, spontaneous movements of all extremities, nonverbal    Data Reviewed: CBC: Recent Labs  Lab 12/01/19 0452 12/02/19 0511 12/03/19 0350 12/05/19 0352 12/07/19 0500  WBC 11.9* 12.1* 12.9*  --  12.4*  NEUTROABS  --   --  7.4  --  8.0*  HGB 8.1* 8.5* 9.5* 10.5* 9.9*  HCT 27.4* 28.5* 31.3* 31.0* 32.2*  MCV 92.3 92.5 89.9  --  89.2  PLT 501* 506* 511*  --  123XX123*   Basic Metabolic Panel: Recent Labs  Lab 12/02/19 0511 12/02/19 0511 12/03/19 0350 12/03/19 0350 12/04/19 0433 12/05/19 0352 12/05/19 0429 12/06/19 0337 12/07/19 0500  NA 144   < > 145   < > 141 141 140 140 141  K 3.0*   < > 3.3*   < > 3.5 3.7 3.6 4.4 4.3  CL 102   < > 102  --  100  --  96* 98 98  CO2 32   < > 32  --  31  --  33* 30 30  GLUCOSE 111*   < > 77  --  152*  --  164* 171* 135*  BUN 39*   < > 34*  --  31*  --  27* 23* 24*  CREATININE 1.17*   < > 1.03*  --  1.04*  --  0.86 0.87 0.84  CALCIUM 8.5*   < > 8.8*  --  8.8*  --  8.9 9.2 8.7*  MG 1.9  --   --   --  2.0  --  1.9 1.9  --   PHOS 2.5  --   --   --   --   --   --   --   --    < > = values in  this interval not displayed.   GFR: Estimated Creatinine Clearance: 81.9 mL/min (by C-G formula based on SCr of 0.84 mg/dL). Liver Function Tests: No results for input(s): AST, ALT, ALKPHOS, BILITOT, PROT, ALBUMIN in the last 168 hours. No results for input(s): LIPASE, AMYLASE in the last 168 hours. No results for input(s): AMMONIA in the last 168 hours. Coagulation Profile: Recent Labs  Lab 12/07/19 0500  INR 1.0   Cardiac Enzymes: No results for input(s): CKTOTAL, CKMB, CKMBINDEX, TROPONINI in the last 168 hours. BNP (last 3 results) No results for input(s): PROBNP in the last 8760 hours. HbA1C: No results for input(s): HGBA1C in the last 72 hours. CBG: Recent Labs  Lab 12/06/19 2020 12/06/19 2344 12/07/19 0422 12/07/19  0730 12/07/19 1225  GLUCAP 232* 185* 118* 174* 161*   Lipid Profile: No results for input(s): CHOL, HDL, LDLCALC, TRIG, CHOLHDL, LDLDIRECT in the last 72 hours. Thyroid Function Tests: No results for input(s): TSH, T4TOTAL, FREET4, T3FREE, THYROIDAB in the last 72 hours. Anemia Panel: No results for input(s): VITAMINB12, FOLATE, FERRITIN, TIBC, IRON, RETICCTPCT in the last 72 hours. Urine analysis:    Component Value Date/Time   COLORURINE YELLOW 11/26/2019 1522   APPEARANCEUR CLEAR 11/26/2019 1522   LABSPEC 1.011 11/26/2019 1522   PHURINE 8.0 11/26/2019 1522   GLUCOSEU NEGATIVE 11/26/2019 1522   HGBUR NEGATIVE 11/26/2019 1522   BILIRUBINUR NEGATIVE 11/26/2019 1522   KETONESUR NEGATIVE 11/26/2019 1522   PROTEINUR 30 (A) 11/26/2019 1522   UROBILINOGEN 1.0 06/01/2015 0104   NITRITE NEGATIVE 11/26/2019 1522   LEUKOCYTESUR NEGATIVE 11/26/2019 1522   Sepsis Labs: @LABRCNTIP (procalcitonin:4,lacticidven:4)  ) No results found for this or any previous visit (from the past 240 hour(s)).    Studies: No results found.  Scheduled Meds: . amLODipine  10 mg Per Tube Daily  . aspirin  81 mg Per Tube Daily  . benztropine  0.5 mg Per Tube Daily  .  budesonide (PULMICORT) nebulizer solution  0.5 mg Nebulization BID  . busPIRone  15 mg Per Tube TID  . chlorhexidine  15 mL Mouth Rinse BID  . Chlorhexidine Gluconate Cloth  6 each Topical Daily  . cloNIDine  0.2 mg Per Tube TID  . dolutegravir  50 mg Oral Daily  . doxepin  50 mg Per Tube QHS  . emtricitabine-tenofovir AF  1 tablet Per Tube Daily  . feeding supplement (VITAL HIGH PROTEIN)  1,000 mL Per Tube Q24H  . fentaNYL      . free water  200 mL Per Tube Q4H  . gabapentin  300 mg Per Tube Q8H  . insulin aspart  0-20 Units Subcutaneous Q4H  . insulin aspart  10 Units Subcutaneous Q4H  . insulin glargine  15 Units Subcutaneous BID  . lidocaine      . mouth rinse  15 mL Mouth Rinse q12n4p  . metoprolol tartrate  12.5 mg Per Tube BID  . midazolam      . pantoprazole sodium  40 mg Per Tube Daily  . polyethylene glycol  17 g Oral Daily  . QUEtiapine  200 mg Per Tube BID  . rosuvastatin  20 mg Per Tube q1800  . sennosides  15 mL Per Tube BID  . sertraline  50 mg Per Tube Daily  . triamterene-hydrochlorothiazide  1 tablet Per Tube Daily    Continuous Infusions: . sodium chloride Stopped (12/07/19 0216)  . ceFAZolin       LOS: 26 days     Alma Friendly, MD [facility]  If 7PM-7AM, please contact night-coverage [email or web address] 12/07/2019, 4:46 PM

## 2019-12-07 NOTE — Procedures (Signed)
Interventional Radiology Procedure Note  Procedure: Placement of percutaneous 20F pull-through gastrostomy tube. Complications: None Recommendations: - NPO except for sips and chips remainder of today and overnight - Maintain G-tube to LWS until tomorrow morning  - May advance diet as tolerated and begin using tube tomorrow morning  Signed,   Marcial Pless S. Laveta Gilkey, DO   
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
## 2019-12-07 NOTE — Plan of Care (Signed)
  Problem: Clinical Measurements: Goal: Ability to maintain clinical measurements within normal limits will improve Outcome: Progressing Goal: Respiratory complications will improve Outcome: Progressing   Problem: Nutrition: Goal: Adequate nutrition will be maintained Outcome: Progressing   Problem: Health Behavior/Discharge Planning: Goal: Ability to manage health-related needs will improve Outcome: Not Progressing   Problem: Activity: Goal: Risk for activity intolerance will decrease Outcome: Not Progressing   Problem: Safety: Goal: Ability to remain free from injury will improve Outcome: Not Progressing

## 2019-12-07 NOTE — Progress Notes (Signed)
Patient returned from having G tube placed in IR. Orders to place on LWS. No orders clarifying meds/tube feeds - patient still has her cortrak in place. Dr. Pascal Lux with radiology paged - stated it's ok to keep off suction, ok to use cortrak for meds, hold tube feeds until tomorrow when ok'd to resume by Dr. Earleen Newport.   Joellen Jersey, RN

## 2019-12-07 NOTE — Progress Notes (Signed)
Asleep for short periods, no meds given for behavior , maintained on close observation.Foley to gravity with large amount of urine output-NGT feeding ongoing with Vital 1.2@60cc /hr with intermittent HDO flushes. No change in status at present will continue to monitor.

## 2019-12-07 NOTE — Progress Notes (Signed)
Patient was off floor for 4 pm trach check.

## 2019-12-07 NOTE — Progress Notes (Signed)
SLP Cancellation Note  Patient Details Name: Susan [name] MRN: [id number] DOB: 1960-09-19   Cancelled treatment:        Checked in to see pt for PMV and BSE (if pt able). Currently in IR for PEG placement. Will continue attempts tomorrow.   [name] 12/07/2019, 4:27 PM

## 2019-12-07 NOTE — Progress Notes (Signed)
Occupational Therapy Treatment Patient Details Name: Susan [name] MRN: [id number] DOB: [birth year] Today's Date: 12/07/2019    History of present illness 59 year old female smoker admitted 3/10 with overdose complicated by aspiration PNA with MRSA,  Pneumococcal bacteremia, toxic metabolic encephalopathy and septic shock.  Frequent falls at home,Tobacco abuse, diabetes with painful polyneuropathy, bipolar disease, GERD, HIV, schizophrenia, history of IV drug abuse, seizure disorder. Intubated 3/10-3/29 3/23 MRI brain >>Acute nonhemorrhagic infarcts in the posterior MCA territory bilaterally. Tracheostomy placed 3/29   OT comments  Pt restless upon arrival with legs over bed rail. Once UEs unrestrained, assisted pt to EOB. Pt scooting out to EOB with R hip as if she was attempting to stand. Not following commands and pulling at lines and therapists. Unsafe to attempt further treatment with pt out of restraints, repositioned in supine. Will continue to follow.  Follow Up Recommendations  SNF;LTACH;Supervision/Assistance - 24 hour    Equipment Recommendations  Other (comment)(defer to next venue)    Recommendations for Other Services      Precautions / Restrictions Precautions Precautions: Fall Precaution Comments: hx of falls at home  Restrictions Weight Bearing Restrictions: No       Mobility Bed Mobility Overal bed mobility: Needs Assistance Bed Mobility: Supine to Sit;Sit to Supine     Supine to sit: +2 for safety/equipment;Min assist Sit to supine: Max assist;+2 for physical assistance;+2 for safety/equipment   General bed mobility comments: Patient received with legs hanging over bed rail, Moving them independently without difficulty. Requires assist to raise trunk. Patient is impulsive and unsafe scooting forward on edge of bed. Required +2 max assist to safely get her back into bed.  Transfers                 General transfer comment: unable due to difficulty  following direction and poor safety awareness    Balance Overall balance assessment: Needs assistance Sitting-balance support: Feet supported;Bilateral upper extremity supported Sitting balance-Leahy Scale: Poor Sitting balance - Comments: Patient impulsive and unsafe with sitting at edge of bed.                                   ADL either performed or assessed with clinical judgement   ADL                                         General ADL Comments: total assist     Vision       Perception     Praxis      Cognition Arousal/Alertness: Awake/alert Behavior During Therapy: Restless;Agitated;Impulsive Overall Cognitive Status: Impaired/Different from baseline Area of Impairment: Following commands;Safety/judgement                       Following Commands: (not following commands) Safety/Judgement: Decreased awareness of safety;Decreased awareness of deficits     General Comments: pulling at lines, throwing legs over bed rail, attempting to get OOB        Exercises     Shoulder Instructions       General Comments      Pertinent Vitals/ Pain       Pain Assessment: Faces Faces Pain Scale: No hurt  Home Living  Prior Functioning/Environment              Frequency  Min 2X/week        Progress Toward Goals  OT Goals(current goals can now be found in the care plan section)  Progress towards OT goals: Not progressing toward goals - comment(agitation)  Acute Rehab OT Goals Patient Stated Goal: non verbal  OT Goal Formulation: Patient unable to participate in goal setting Time For Goal Achievement: 12/16/19 Potential to Achieve Goals: Walker Discharge plan remains appropriate    Co-evaluation    PT/OT/SLP Co-Evaluation/Treatment: Yes Reason for Co-Treatment: Necessary to address cognition/behavior during functional activity;Complexity of the  patient's impairments (multi-system involvement) PT goals addressed during session: Mobility/safety with mobility;Balance OT goals addressed during session: Strengthening/ROM      AM-PAC OT "6 Clicks" Daily Activity     Outcome Measure   Help from another person eating meals?: Total Help from another person taking care of personal grooming?: Total Help from another person toileting, which includes using toliet, bedpan, or urinal?: Total Help from another person bathing (including washing, rinsing, drying)?: Total Help from another person to put on and taking off regular upper body clothing?: Total Help from another person to put on and taking off regular lower body clothing?: Total 6 Click Score: 6    End of Session Equipment Utilized During Treatment: Oxygen(5L 35%)  OT Visit Diagnosis: Other symptoms and signs involving cognitive function;Muscle weakness (generalized) (M62.81) Hemiplegia - caused by: Cerebral infarction   Activity Tolerance Treatment limited secondary to agitation   Patient Left in bed;with call bell/phone within reach;with bed alarm set;with restraints reapplied   Nurse Communication          Time: [id number] OT Time Calculation (min): 16 min  Charges: OT General Charges $OT Visit: 1 Visit  Nestor Lewandowsky, OTR/L Acute Rehabilitation Services Pager: [phone] Office: [phone]   Susan [name] 12/07/2019, 12:48 PM

## 2019-12-07 NOTE — Progress Notes (Signed)
Physical Therapy Treatment Patient Details Name: Susan [name] MRN: [id number] DOB: 1961-02-02 Today's Date: 12/07/2019    History of Present Illness 59 year old female smoker admitted 3/10 with overdose complicated by aspiration PNA with MRSA,  Pneumococcal bacteremia, toxic metabolic encephalopathy and septic shock.  Frequent falls at home,Tobacco abuse, diabetes with painful polyneuropathy, bipolar disease, GERD, HIV, schizophrenia, history of IV drug abuse, seizure disorder. Intubated 3/10-3/29 3/23 MRI brain >>Acute nonhemorrhagic infarcts in the posterior MCA territory bilaterally. Tracheostomy placed 3/29    PT Comments    Patient received in bed with legs over side rail. Arms in restraints. Patient is awake, non verbal and does not follow direction. She is attempting to get out of bed once restraints removed and continued to scoot forward unsafely. Patient is impulsive and has poor safety awareness. She required max +2 assist to return to bed safely with UE restraints re-applied as she was grabbing for lines. Patient is too unsafe to progress mobility this visit. Patient will continue to benefit from skilled PT while here to improve functional mobility, safety and independence.   Follow Up Recommendations  SNF     Equipment Recommendations  None recommended by PT    Recommendations for Other Services       Precautions / Restrictions Precautions Precautions: Fall Precaution Comments: hx of falls at home  Restrictions Weight Bearing Restrictions: No    Mobility  Bed Mobility Overal bed mobility: Needs Assistance Bed Mobility: Supine to Sit;Sit to Supine     Supine to sit: +2 for safety/equipment;Min assist Sit to supine: Max assist;+2 for physical assistance;+2 for safety/equipment   General bed mobility comments: Patient received with legs hanging over bed rail, Moving them independently without difficulty. Requires assist to raise trunk. Patient is impulsive and unsafe  scooting forward on edge of bed. Required +2 max assist to safely get her back into bed.  Transfers                 General transfer comment: unable due to difficulty following direction and poor safety awareness  Ambulation/Gait             General Gait Details: unable   Stairs             Wheelchair Mobility    Modified Rankin (Stroke Patients Only) Modified Rankin (Stroke Patients Only) Pre-Morbid Rankin Score: No symptoms Modified Rankin: Severe disability     Balance Overall balance assessment: Needs assistance Sitting-balance support: Feet supported;Bilateral upper extremity supported Sitting balance-Leahy Scale: Poor Sitting balance - Comments: Patient impulsive and unsafe with sitting at edge of bed.                                    Cognition Arousal/Alertness: Awake/alert Behavior During Therapy: Restless;Agitated;Impulsive Overall Cognitive Status: No family/caregiver present to determine baseline cognitive functioning                                 General Comments: patient awake, closing eyes at times. Not responsive to verbal commands or direction. Trying to get out of bed.      Exercises      General Comments        Pertinent Vitals/Pain Pain Assessment: Faces Faces Pain Scale: No hurt    Home Living  Prior Function            PT Goals (current goals can now be found in the care plan section) Acute Rehab PT Goals Patient Stated Goal: non verbal  PT Goal Formulation: Patient unable to participate in goal setting Time For Goal Achievement: 12/15/19 Potential to Achieve Goals: Fair Progress towards PT goals: Progressing toward goals    Frequency    Min 3X/week      PT Plan Current plan remains appropriate    Co-evaluation PT/OT/SLP Co-Evaluation/Treatment: Yes Reason for Co-Treatment: For patient/therapist safety;To address functional/ADL  transfers;Necessary to address cognition/behavior during functional activity PT goals addressed during session: Mobility/safety with mobility;Balance        AM-PAC PT "6 Clicks" Mobility   Outcome Measure  Help needed turning from your back to your side while in a flat bed without using bedrails?: A Little Help needed moving from lying on your back to sitting on the side of a flat bed without using bedrails?: A Little Help needed moving to and from a bed to a chair (including a wheelchair)?: Total Help needed standing up from a chair using your arms (e.g., wheelchair or bedside chair)?: Total Help needed to walk in hospital room?: Total Help needed climbing 3-5 steps with a railing? : Total 6 Click Score: 10    End of Session Equipment Utilized During Treatment: Oxygen Activity Tolerance: Treatment limited secondary to agitation;Other (comment)(impulsivity and decreased ability to follow direction) Patient left: in bed;with call bell/phone within reach;with bed alarm set;with restraints reapplied Nurse Communication: Mobility status PT Visit Diagnosis: Unsteadiness on feet (R26.81);Other abnormalities of gait and mobility (R26.89);Repeated falls (R29.6);Muscle weakness (generalized) (M62.81);History of falling (Z91.81);Difficulty in walking, not elsewhere classified (R26.2);Hemiplegia and hemiparesis Hemiplegia - Right/Left: Left Hemiplegia - caused by: Cerebral infarction     Time: [id number] PT Time Calculation (min) (ACUTE ONLY): 13 min  Charges:  $Therapeutic Activity: 8-22 mins                     Surya Schroeter, PT, GCS 12/07/19,12:13 PM

## 2019-12-08 DIAGNOSIS — R627 Adult failure to thrive: Secondary | ICD-10-CM | POA: Diagnosis not present

## 2019-12-08 DIAGNOSIS — Z930 Tracheostomy status: Secondary | ICD-10-CM | POA: Diagnosis not present

## 2019-12-08 DIAGNOSIS — B20 Human immunodeficiency virus [HIV] disease: Secondary | ICD-10-CM | POA: Diagnosis not present

## 2019-12-08 DIAGNOSIS — J9601 Acute respiratory failure with hypoxia: Secondary | ICD-10-CM | POA: Diagnosis not present

## 2019-12-08 DIAGNOSIS — G9341 Metabolic encephalopathy: Secondary | ICD-10-CM | POA: Diagnosis not present

## 2019-12-08 LAB — BASIC METABOLIC PANEL
Anion gap: 12 (ref 5–15)
BUN: 21 mg/dL — ABNORMAL HIGH (ref 6–20)
CO2: 30 mmol/L (ref 22–32)
Calcium: 9.1 mg/dL (ref 8.9–10.3)
Chloride: 97 mmol/L — ABNORMAL LOW (ref 98–111)
Creatinine, Ser: 0.92 mg/dL (ref 0.44–1.00)
GFR calc Af Amer: >60 mL/min (ref >60)
GFR calc non Af Amer: >60 mL/min (ref >60)
Glucose, Bld: 152 mg/dL — ABNORMAL HIGH (ref 70–99)
Potassium: 3.6 mmol/L (ref 3.5–5.1)
Sodium: 139 mmol/L (ref 135–145)

## 2019-12-08 LAB — GLUCOSE, CAPILLARY
Glucose-Capillary: 123 mg/dL — ABNORMAL HIGH (ref 70–99)
Glucose-Capillary: 138 mg/dL — ABNORMAL HIGH (ref 70–99)
Glucose-Capillary: 140 mg/dL — ABNORMAL HIGH (ref 70–99)
Glucose-Capillary: 148 mg/dL — ABNORMAL HIGH (ref 70–99)
Glucose-Capillary: 151 mg/dL — ABNORMAL HIGH (ref 70–99)
Glucose-Capillary: 167 mg/dL — ABNORMAL HIGH (ref 70–99)
Glucose-Capillary: 179 mg/dL — ABNORMAL HIGH (ref 70–99)

## 2019-12-08 LAB — CBC WITH DIFFERENTIAL/PLATELET
Abs Immature Granulocytes: 0.07 10*3/uL (ref 0.00–0.07)
Basophils Absolute: 0.1 10*3/uL (ref 0.0–0.1)
Basophils Relative: 1 %
Eosinophils Absolute: 0.6 10*3/uL — ABNORMAL HIGH (ref 0.0–0.5)
Eosinophils Relative: 5 %
HCT: 33 % — ABNORMAL LOW (ref 36.0–46.0)
Hemoglobin: 10 g/dL — ABNORMAL LOW (ref 12.0–15.0)
Immature Granulocytes: 1 %
Lymphocytes Relative: 22 %
Lymphs Abs: 2.8 10*3/uL (ref 0.7–4.0)
MCH: 27.2 pg (ref 26.0–34.0)
MCHC: 30.3 g/dL (ref 30.0–36.0)
MCV: 89.9 fL (ref 80.0–100.0)
Monocytes Absolute: 1.2 10*3/uL — ABNORMAL HIGH (ref 0.1–1.0)
Monocytes Relative: 9 %
Neutro Abs: 8 10*3/uL — ABNORMAL HIGH (ref 1.7–7.7)
Neutrophils Relative %: 62 %
Platelets: 384 10*3/uL (ref 150–400)
RBC: 3.67 MIL/uL — ABNORMAL LOW (ref 3.87–5.11)
RDW: 19.8 % — ABNORMAL HIGH (ref 11.5–15.5)
WBC: 12.7 10*3/uL — ABNORMAL HIGH (ref 4.0–10.5)
nRBC: 0 % (ref 0.0–0.2)

## 2019-12-08 MED ORDER — LORAZEPAM 0.5 MG PO TABS
0.5000 mg | ORAL_TABLET | Freq: Once | ORAL | Status: AC
  Administered 2019-12-08: 0.5 mg
  Filled 2019-12-08: qty 1

## 2019-12-08 MED ORDER — JEVITY 1.2 CAL PO LIQD
1000.0000 mL | ORAL | Status: DC
  Administered 2019-12-08 – 2019-12-22 (×7): 1000 mL
  Filled 2019-12-08 (×27): qty 1000

## 2019-12-08 MED ORDER — PRO-STAT SUGAR FREE PO LIQD
30.0000 mL | Freq: Three times a day (TID) | ORAL | Status: DC
  Administered 2019-12-08 – 2020-01-05 (×84): 30 mL
  Filled 2019-12-08 (×84): qty 30

## 2019-12-08 MED ORDER — HALOPERIDOL LACTATE 5 MG/ML IJ SOLN
5.0000 mg | Freq: Once | INTRAMUSCULAR | Status: AC
  Administered 2019-12-08: 5 mg via INTRAVENOUS
  Filled 2019-12-08: qty 1

## 2019-12-08 NOTE — Progress Notes (Signed)
Nutrition Follow-up  DOCUMENTATION CODES:   Obesity unspecified  INTERVENTION:   Resume TF via PEG today:  Jevity 1.2 at 50 ml/h  Pro-stat 30 ml TID  Provides 1740 kcal, 112 gm protein, 972 ml free water daily  Continue current free water flushes 200 ml every 4 hours for total free water intake = 2172 ml per day.  NUTRITION DIAGNOSIS:   Inadequate oral intake related to inability to eat as evidenced by NPO status.  Ongoing   GOAL:   Patient will meet greater than or equal to 90% of their needs  Met with TF  MONITOR:   TF tolerance, Skin, Labs  REASON FOR ASSESSMENT:   Ventilator, Consult Enteral/tube feeding initiation and management  ASSESSMENT:   59 yo female admitted S/P fall at home, unresponsive, hypotensive. Intubated by EMS. Suspected overdose with toxic/metabolic encephalopathy and aspiration PNA. PMH includes schizophrenia, HIV, DM, neuropathy, HTN.  3/22 - CT head positive for stroke 3/23 - MRI revealed bilateral acute infarcts in the posterior MCA territory  3/29 - S/P trach 4/05 - S/P PEG  Currently on trach collar.   SLP following with plans for swallow assessment when patient can sustain attention to task longer and tolerate PMSV for a longer amount of time. NPO for now.  Palliative Care team following patient. Family wants to continue all medical interventions to prolong life.    PEG tube was placed 4/5.  Okay to use PEG today per IR. Receiving 200 ml free water flushes every 4 hours. TF being resumed today.  Labs and medications reviewed. CBGs: 148-151  Diet Order:   Diet Order            Diet NPO time specified  Diet effective now              EDUCATION NEEDS:   Not appropriate for education at this time  Skin:  Skin Assessment: Skin Integrity Issues: Skin Integrity Issues:: Other (Comment) Other: non-pressure wound buttocks  Last BM:  4/5 type 1  Height:   Ht Readings from Last 1 Encounters:  11/17/19 5' 3"  (1.6 m)     Weight:   Wt Readings from Last 1 Encounters:  12/07/19 96.9 kg    Ideal Body Weight:  52.3 kg  BMI:  Body mass index is 37.84 kg/m.  Estimated Nutritional Needs:   Kcal:  1700-1900  Protein:  105-130 gm  Fluid:  >/= 1.7 L    Molli Barrows, RD, LDN, CNSC Please refer to Amion for contact information.

## 2019-12-08 NOTE — Progress Notes (Signed)
Sutures removed per MD order with Lexine Baton, RRT. Sutures very close to patients neck on left side. A little was left in and could not be removed without harming patient. RT will continue to monitor.

## 2019-12-08 NOTE — Progress Notes (Signed)
Referring Physician(s): Dr Horris Latino  Supervising Physician: Arne Cleveland  Patient Status:  [facility] - In-pt  Chief Complaint:  Dysphagia   Subjective:  Percutaneous gastric tube placed in IR yesterday Intact Clean and dry   Allergies: Lyrica [pregabalin]  Medications: Prior to Admission medications   Medication Sig Start Date End Date Taking? Authorizing Provider  acetaminophen (TYLENOL) 650 MG CR tablet Take 1,300 mg by mouth every 8 (eight) hours as needed for pain.   Yes [provider]  albuterol (VENTOLIN HFA) 108 (90 Base) MCG/ACT inhaler INHALE 2 PUFFS BY MOUTH EVERY 6 HOURS AS NEEDED FOR WHEEZING OR SHORTNESS OF BREATH Patient taking differently: Inhale 2 puffs into the lungs every 6 (six) hours as needed for shortness of breath.  11/09/19  Yes Hoyt Koch, MD  amLODipine (NORVASC) 10 MG tablet TAKE 1 TABLET BY MOUTH DAILY Patient taking differently: Take 10 mg by mouth daily.  11/09/19  Yes Hoyt Koch, MD  ANORO ELLIPTA 62.5-25 MCG/INH AEPB INHALE 1 PUFF INTO THE LUNGS DAILY. Patient taking differently: Inhale 1 puff into the lungs daily.  11/09/19  Yes Hoyt Koch, MD  bictegravir-emtricitabine-tenofovir AF (BIKTARVY) 50-200-25 MG TABS tablet Take 1 tablet by mouth daily. 06/22/19  Yes Comer, Okey Regal, MD  cloNIDine (CATAPRES) 0.2 MG tablet TAKE 1 TABLET BY MOUTH TWICE A DAY 11/09/19  Yes Hoyt Koch, MD  enalapril (VASOTEC) 20 MG tablet TAKE 1 TABLET BY MOUTH DAILY Patient taking differently: Take 20 mg by mouth daily.  11/09/19  Yes Hoyt Koch, MD  gabapentin (NEURONTIN) 400 MG capsule Take 3 capsules (1,200 mg total) by mouth 3 (three) times daily. Patient taking differently: Take 1,200 mg by mouth See admin instructions. Take two tablets by mouth in the morning, then take 2 in the afternoon, then take two in the evening, then take 4 tablets at bedtime per daughter 06/09/19  Yes Hoyt Koch, MD  glucose  blood (ONETOUCH VERIO) test strip 1 each by Other route 2 (two) times daily. And lancets 2/day 09/24/18  Yes Renato Shin, MD  hydrOXYzine (VISTARIL) 25 MG capsule Take 1 capsule (25 mg total) by mouth every 8 (eight) hours as needed for anxiety. 07/20/19  Yes Comer, Okey Regal, MD  Insulin NPH, Human,, Isophane, (HUMULIN N KWIKPEN) 100 UNIT/ML Kiwkpen Inject 130 Units into the skin daily.   Yes [provider]  Insulin Pen Needle (PEN NEEDLES 5/16") 30G X 8 MM MISC 1 each by Does not apply route daily. 12/15/18  Yes Renato Shin, MD  lisinopril (ZESTRIL) 20 MG tablet Take 20 mg by mouth daily.   Yes [provider]  omeprazole (PRILOSEC) 20 MG capsule TAKE 1 CAPSULE (20 MG TOTAL) BY MOUTH DAILY. Patient taking differently: Take 20 mg by mouth 2 (two) times daily before a meal.  11/09/19  Yes Hoyt Koch, MD  QUEtiapine (SEROQUEL) 200 MG tablet Take 1 tablet (200 mg total) by mouth 2 (two) times daily for 30 days. Please defer to PCP or psychiatrist for refills 02/19/19 11/11/19 Yes Donne Hazel, MD  QUEtiapine (SEROQUEL) 400 MG tablet Take 1 tablet (400 mg total) by mouth at bedtime for 30 days. Please defer to PCP or psychiatrist for refills 02/19/19 11/11/19 Yes Donne Hazel, MD  rosuvastatin (CRESTOR) 20 MG tablet TAKE 1 TABLET BY MOUTH DAILY Patient taking differently: Take 20 mg by mouth daily.  11/09/19  Yes Hoyt Koch, MD  sertraline (ZOLOFT) 50 MG tablet Take  50 mg by mouth daily.   Yes [provider]  tapentadol (NUCYNTA ER) 100 MG 12 hr tablet Take 100 mg by mouth every 12 (twelve) hours.   Yes [provider]  ARIPiprazole (ABILIFY) 10 MG tablet Take 1 tablet (10 mg total) by mouth daily for 30 days. Please defer to PCP or psychiatrist for additional refills Patient not taking: Reported on 11/11/2019 02/20/19 03/22/19  Donne Hazel, MD  benztropine (COGENTIN) 0.5 MG tablet Take 0.5 mg by mouth daily.    [provider]    doxepin (SINEQUAN) 50 MG capsule Take 50 mg by mouth at bedtime.  09/05/17   [provider]  Insulin Lispro Prot & Lispro (HUMALOG MIX 50/50 KWIKPEN) (50-50) 100 UNIT/ML Kwikpen Inject 130 Units into the skin daily with breakfast. And pen needles 3/day Patient not taking: Reported on 11/11/2019 09/24/19   Renato Shin, MD  metFORMIN (GLUCOPHAGE) 500 MG tablet TAKE 2 TABLETS BY MOUTH TWICE A DAY WITH A MEAL Patient not taking: Reported on 11/11/2019 11/09/19   Hoyt Koch, MD  sertraline (ZOLOFT) 100 MG tablet Take 1 tablet (100 mg total) by mouth daily for 30 days. Please defer to PCP or psychiatrist for refills 02/20/19 03/22/19  Donne Hazel, MD  tapentadol [facility]) 50 MG 12 hr tablet Take 1 tablet (50 mg total) by mouth every 12 (twelve) hours. Patient not taking: Reported on 11/11/2019 10/15/19   Hoyt Koch, MD  triamterene-hydrochlorothiazide [facility]) 37.5-25 MG tablet TAKE 1 TABLET BY MOUTH DAILY Patient not taking: Reported on 11/11/2019 11/09/19   Hoyt Koch, MD     Vital Signs: BP (!) 145/87 (BP Location: Left Wrist)   Pulse 86   Temp 98.2 F (36.8 C) (Oral)   Resp 18   Ht 5\' 3"  (1.6 m)   Wt 213 lb 10 oz (96.9 kg)   LMP  (LMP Unknown)   SpO2 97%   BMI 37.84 kg/m   Physical Exam Vitals reviewed.  Skin:    General: Skin is warm and dry.     Comments: No bleeding Site c/d/i NT  +BS     Imaging: IR GASTROSTOMY TUBE MOD SED  Result Date: 12/07/2019 INDICATION: 59 year old female with a history of dysphagia EXAM: PERC PLACEMENT GASTROSTOMY MEDICATIONS: 2 g Ancef; Antibiotics were administered within 1 hour of the procedure. ANESTHESIA/SEDATION: Versed 1.0 mg IV; Fentanyl 50 mcg IV Moderate Sedation Time:  10 minutes The patient was continuously monitored during the procedure by the interventional radiology nurse under my direct supervision. CONTRAST:  8mL OMNIPAQUE IOHEXOL 300 MG/ML SOLN - administered into the gastric lumen.  FLUOROSCOPY TIME:  Fluoroscopy Time: 3 minutes 54 seconds (51 mGy). COMPLICATIONS: None PROCEDURE: Informed written consent was obtained from the patient and the patient's family after a thorough discussion of the procedural risks, benefits and alternatives. All questions were addressed. Maximal Sterile Barrier Technique was utilized including caps, mask, sterile gowns, sterile gloves, sterile drape, hand hygiene and skin antiseptic. A timeout was performed prior to the initiation of the procedure. The epigastrium was prepped with Betadine in a sterile fashion, and a sterile drape was applied covering the operative field. A sterile gown and sterile gloves were used for the procedure. A 5-French orogastric tube is placed under fluoroscopic guidance. Scout imaging of the abdomen confirms barium within the transverse colon. The stomach was distended with gas. Under fluoroscopic guidance, an 18 gauge needle was utilized to puncture the anterior wall of the body of the stomach.  An Amplatz wire was advanced through the needle passing a T fastener into the lumen of the stomach. The T fastener was secured for gastropexy. A 9-French sheath was inserted. A snare was advanced through the 9-French sheath. A Britta Mccreedy was advanced through the orogastric tube. It was snared then pulled out the oral cavity, pulling the snare, as well. The leading edge of the gastrostomy was attached to the snare. It was then pulled down the esophagus and out the percutaneous site. Tube secured in place. Contrast was injected. Patient tolerated the procedure well and remained hemodynamically stable throughout. No complications were encountered and no significant blood loss encountered. IMPRESSION: Status post fluoroscopic placed percutaneous gastrostomy tube, with 20 [place] pull-through. Signed, Dulcy Fanny. Earleen Newport, DO [facility] Electronically Signed   By: Corrie Mckusick D.O.   On: 12/07/2019  16:46    Labs:  CBC: Recent Labs    12/02/19 0511 12/02/19 0511 12/03/19 0350 12/05/19 0352 12/07/19 0500 12/08/19 0500  WBC 12.1*  --  12.9*  --  12.4* 12.7*  HGB 8.5*   < > 9.5* 10.5* 9.9* 10.0*  HCT 28.5*   < > 31.3* 31.0* 32.2* 33.0*  PLT 506*  --  511*  --  446* 384   < > = values in this interval not displayed.    COAGS: Recent Labs    11/16/19 0442 11/17/19 0341 11/30/19 1103 12/07/19 0500  INR 1.1 1.2 1.2 1.0  APTT  --   --  26  --     BMP: Recent Labs    12/05/19 0429 12/06/19 0337 12/07/19 0500 12/08/19 0500  NA 140 140 141 139  K 3.6 4.4 4.3 3.6  CL 96* 98 98 97*  CO2 33* 30 30 30   GLUCOSE 164* 171* 135* 152*  BUN 27* 23* 24* 21*  CALCIUM 8.9 9.2 8.7* 9.1  CREATININE 0.86 0.87 0.84 0.92  GFRNONAA >60 >60 >60 >60  GFRAA >60 >60 >60 >60    LIVER FUNCTION TESTS: Recent Labs    06/29/19 1341 11/11/19 1453 11/14/19 0411 11/25/19 0500  BILITOT 0.2 0.4 0.6 0.6  AST 11 43* 42* 46*  ALT 8 18 18  35  ALKPHOS  --  89 103 288*  PROT 6.5 5.5* 4.9* 7.0  ALBUMIN  --  2.4* 1.6* 1.2*    Assessment and Plan:  May use G tube now  Electronically Signed: Lavonia Drafts, PA-C 12/08/2019, 10:02 AM   I spent a total of 15 Minutes at the the patient's bedside AND on the patient's hospital floor or unit, greater than 50% of which was counseling/coordinating care for G tube

## 2019-12-08 NOTE — Progress Notes (Signed)
  Speech Language Pathology Treatment: Nada Boozer Speaking valve  Patient Details Name: Susan [name] MRN: [id number] DOB: 08/11/1961 Today's Date: 12/08/2019 Time: [id number] SLP Time Calculation (min) (ACUTE ONLY): 18 min  Assessment / Plan / Recommendation Clinical Impression  Therapist assisted tech and unit secretary reposition and apply restraints (arm & leg). Pt frustrated, mouthing and throwing legs over bed rail. Session focused on skilled therapy with PMV for verbal expression of her thoughts/needs. Therapist fully deflated cuff which had been minimally inflated initiating reflexive cough and mucous expectoration from trach. Reflexive cough following approximately 20 seconds after valve donned either from mobilization of secretions and/or back pressure from exhalation after which valve remained in place for 3 min intervals. Elicitation of requests and phonation is difficult given current cognitive status with poor sustained attention. She would not verbalize on command but vocal quality and clarity in spontaneous speech was mostly adequate and intelligible but was not relevant to situation.  Will perform bedside swallow assessment when she can sustain attention to task a bit longer and tolerate speaking valve for increased length of time.    HPI HPI: 59 year old female smoker admitted 3/10 with overdose complicated by aspiration PNA with MRSA,  Pneumococcal bacteremia, toxic metabolic encephalopathy and septic shock.        SLP Plan  Continue with current plan of care       Recommendations         Patient may use Passy-Muir Speech Valve: with SLP only PMSV Supervision: Full MD: Please consider changing trach tube to : Cuffless;Smaller size         Follow up Recommendations: Skilled Nursing facility SLP Visit Diagnosis: Aphonia (R49.1) Plan: Continue with current plan of care       GO                [name] 12/08/2019, 10:53 AM [name] Caroli.Ed  Risk analyst [phone] Office [phone]

## 2019-12-08 NOTE — Progress Notes (Signed)
PROGRESS NOTE  Susan [name] [id number] DOB: 1961-04-30 DOA: 11/11/2019 PCP: Hoyt Koch, MD  HPI/Recap of past 24 hours: HPI from PCCM 59 year old female with medical history significant for HIV, diabetes with neuropathy, seizure disorder, tobacco abuse, bipolar disease, schizophrenia, history of IV drug abuse admitted 3/10 to ICU with overdose complicated by aspiration pneumonia with MRSA, pneumococcal bacteremia, toxic metabolic encephalopathy, septic shock and acute versus subacute bilateral MCA infarcts. Patient was intubated on 3/10, difficult to wean off, tracheostomy placed on 3/29.  [facility] hospital[facility] assumed care on 12/06/2019.    Today, patient alert, very restless, unable to follow commands, able to look around, but cannot track appropriately.    Assessment/Plan: Principal Problem:   Acute respiratory failure with hypoxemia (HCC) Active Problems:   HIV disease (HCC)   Drug-induced hypotension   DNR (do not resuscitate) discussion   AKI (acute kidney injury) ([place])   Pneumococcal bacteremia   Pneumonia   Aortic valve endocarditis   Acute kidney injury (AKI) with acute tubular necrosis (ATN) (HCC)   Acute metabolic encephalopathy   Cerebral embolism with cerebral infarction   Status post tracheostomy [facility])   Palliative care by specialist   Adult failure to thrive   Acute hypoxic/hypercapnic respiratory failure with ARDS from MRSA PNA and aspiration pneumonia Hx of asthma Intubated on 3/10, difficult to wean off, status post trach on 3/29 Currently on trach collar with 5 L of O2 Continue Pulmicort nebulizer, DuoNeb as needed VAP prevention order set Suctioning and standard trach care as needed  Aerococcus bacteremia 3/25 Klebsiella bronchitis/pneumonia 3/25 Persistent leukocytosis Completed 7-day course cefepime on 4/1   Acute bilateral large bilateral posterior MCA infarcts - 3/23 with severe encephalopathy TEE 3/16 with no sign of endocarditis    MRI brain c/w b/l posterior MCA territory infarcts; MRA/ MRV neg Continue aspirin, Crestor PT/OT/SLP  Acute metabolic encephalopathy/hospital delirium Multifactorial, including sepsis, hypoxia, with underlying history of schizophrenia/seizures, anoxic brain injury Persistent agitation as mentioned above Continue current regimen: Cogentin, BuSpar, doxepin, Neurontin, Zoloft, Seroquel Posey if needed, try to avoid/minimize benzodiazepines  Acute systolic heart failure with sepsis induced CM Hx of HTN, HLD Currently on clonidine, amlodipine, maxide, metoprolol ACE inhibitor on hold Continue Crestor  DM type II poorly controlled HbA1C 12.3 from 11/11/19 SSI, lantus, hypoglycemic protocol, Accu-Cheks  HIV Continue home Descovy, [place]  At risk malnutrition S/p PEG placement on 4/5, by IR Continue tube feeds  Obesity Lifestyle modification advised once able to understand  [place] Very poor prognosis due to multiple comorbidities Palliative consulted-daughter with very poor insight.        Malnutrition Type:  Nutrition Problem: Inadequate oral intake Etiology: inability to eat   Malnutrition Characteristics:  Signs/Symptoms: NPO status   Nutrition Interventions:  Interventions: Tube feeding    Estimated body mass index is 37.84 kg/m as calculated from the following:   Height as of this encounter: 5\' 3"  (1.6 m).   Weight as of this encounter: 96.9 kg.     Code Status: Full  Family Communication: Spoke to daughter on 12/08/19, agreed for placement in [place].  [facility]position Plan: [facility] TOC notified.    Consultants:  PCCM  ID  Neurology  Procedures:  Mechanical ventilation  TEE  Antimicrobials:  None  DVT prophylaxis: [place] Heparin   Objective: Vitals:   12/08/19 0743 12/08/19 0748 12/08/19 1121 12/08/19 1502  BP:  (!) 145/87    Pulse: 85 86 84 87  Resp: 16 18 18  18  Temp:  98.2 F (36.8 C)    TempSrc:   Oral    SpO2: 99% 97% 98% 98%  Weight:      Height:        Intake/Output Summary (Last 24 hours) at 12/08/2019 1633 Last data filed at 12/08/2019 1400 Gross per 24 hour  Intake --  Output 3125 ml  Net -3125 ml   Filed Weights   12/05/19 2200 12/06/19 0347 12/07/19 0400  Weight: 100.1 kg 100.1 kg 96.9 kg    Exam:  General:  Restless, chronically ill-appearing, nonverbal  Cardiovascular: S1, S2 present  Respiratory:  Trach collar in good position, decreased breath sounds at the bases  Abdomen: Soft, nontender, nondistended, bowel sounds present  Musculoskeletal: No bilateral pedal edema noted  Skin: Normal  Psychiatry:  Unable to assess  Neurology: Patient does not follow commands, does not track appropriately, spontaneous movements of all extremities, nonverbal    Data Reviewed: CBC: Recent Labs  Lab 12/02/19 0511 12/03/19 0350 12/05/19 0352 12/07/19 0500 12/08/19 0500  WBC 12.1* 12.9*  --  12.4* 12.7*  NEUTROABS  --  7.4  --  8.0* 8.0*  HGB 8.5* 9.5* 10.5* 9.9* 10.0*  HCT 28.5* 31.3* 31.0* 32.2* 33.0*  MCV 92.5 89.9  --  89.2 89.9  PLT 506* 511*  --  446* 0000000   Basic Metabolic Panel: Recent Labs  Lab 12/02/19 0511 12/03/19 0350 12/04/19 0433 12/04/19 0433 12/05/19 0352 12/05/19 0429 12/06/19 0337 12/07/19 0500 12/08/19 0500  NA 144   < > 141   < > 141 140 140 141 139  K 3.0*   < > 3.5   < > 3.7 3.6 4.4 4.3 3.6  CL 102   < > 100  --   --  96* 98 98 97*  CO2 32   < > 31  --   --  33* 30 30 30   GLUCOSE 111*   < > 152*  --   --  164* 171* 135* 152*  BUN 39*   < > 31*  --   --  27* 23* 24* 21*  CREATININE 1.17*   < > 1.04*  --   --  0.86 0.87 0.84 0.92  CALCIUM 8.5*   < > 8.8*  --   --  8.9 9.2 8.7* 9.1  MG 1.9  --  2.0  --   --  1.9 1.9  --   --   PHOS 2.5  --   --   --   --   --   --   --   --    < > = values in this interval not displayed.   GFR: Estimated Creatinine Clearance: 74.8 mL/min (by C-G formula based on SCr of 0.92 mg/dL). Liver  Function Tests: No results for input(s): AST, ALT, ALKPHOS, BILITOT, PROT, ALBUMIN in the last 168 hours. No results for input(s): LIPASE, AMYLASE in the last 168 hours. No results for input(s): AMMONIA in the last 168 hours. Coagulation Profile: Recent Labs  Lab 12/07/19 0500  INR 1.0   Cardiac Enzymes: No results for input(s): CKTOTAL, CKMB, CKMBINDEX, TROPONINI in the last 168 hours. BNP (last 3 results) No results for input(s): PROBNP in the last 8760 hours. HbA1C: No results for input(s): HGBA1C in the last 72 hours. CBG: Recent Labs  Lab 12/07/19 2020 12/08/19 0017 12/08/19 0357 12/08/19 0804 12/08/19 1235  GLUCAP 153* 167* 138* 148* 151*   Lipid Profile: No results for input(s): CHOL, HDL, LDLCALC, TRIG,  CHOLHDL, LDLDIRECT in the last 72 hours. Thyroid Function Tests: No results for input(s): TSH, T4TOTAL, FREET4, T3FREE, THYROIDAB in the last 72 hours. Anemia Panel: No results for input(s): VITAMINB12, FOLATE, FERRITIN, TIBC, IRON, RETICCTPCT in the last 72 hours. Urine analysis:    Component Value Date/Time   COLORURINE YELLOW 11/26/2019 1522   APPEARANCEUR CLEAR 11/26/2019 1522   LABSPEC 1.011 11/26/2019 1522   PHURINE 8.0 11/26/2019 1522   GLUCOSEU NEGATIVE 11/26/2019 1522   HGBUR NEGATIVE 11/26/2019 1522   BILIRUBINUR NEGATIVE 11/26/2019 1522   KETONESUR NEGATIVE 11/26/2019 1522   PROTEINUR 30 (A) 11/26/2019 1522   UROBILINOGEN 1.0 06/01/2015 0104   NITRITE NEGATIVE 11/26/2019 1522   LEUKOCYTESUR NEGATIVE 11/26/2019 1522   Sepsis Labs: @LABRCNTIP (procalcitonin:4,lacticidven:4)  ) No results found for this or any previous visit (from the past 240 hour(s)).    Studies: No results found.  Scheduled Meds: . amLODipine  10 mg Per Tube Daily  . aspirin  81 mg Per Tube Daily  . benztropine  0.5 mg Per Tube Daily  . budesonide (PULMICORT) nebulizer solution  0.5 mg Nebulization BID  . busPIRone  15 mg Per Tube TID  . chlorhexidine  15 mL Mouth Rinse  BID  . Chlorhexidine Gluconate Cloth  6 each Topical Daily  . cloNIDine  0.2 mg Per Tube TID  . dolutegravir  50 mg Oral Daily  . doxepin  50 mg Per Tube QHS  . emtricitabine-tenofovir AF  1 tablet Per Tube Daily  . feeding supplement (PRO-STAT SUGAR FREE 64)  30 mL Per Tube TID  . free water  200 mL Per Tube Q4H  . gabapentin  300 mg Per Tube Q8H  . insulin aspart  0-20 Units Subcutaneous Q4H  . insulin aspart  10 Units Subcutaneous Q4H  . insulin glargine  15 Units Subcutaneous BID  . mouth rinse  15 mL Mouth Rinse q12n4p  . metoprolol tartrate  12.5 mg Per Tube BID  . neomycin-bacitracin-polymyxin  1 application Topical Daily  . pantoprazole sodium  40 mg Per Tube Daily  . polyethylene glycol  17 g Oral Daily  . QUEtiapine  200 mg Per Tube BID  . rosuvastatin  20 mg Per Tube q1800  . sennosides  15 mL Per Tube BID  . sertraline  50 mg Per Tube Daily  . triamterene-hydrochlorothiazide  1 tablet Per Tube Daily    Continuous Infusions: . sodium chloride Stopped (12/07/19 0216)  . feeding supplement (JEVITY 1.2 CAL)       LOS: 27 days     Alma Friendly, MD [facility]  If 7PM-7AM, please contact night-coverage [email or web address] 12/08/2019, 4:33 PM

## 2019-12-08 NOTE — Progress Notes (Signed)
NAME:  Susan [name], MRN:  [id number], DOB:  November 15, 1960, LOS: 70 ADMISSION DATE:  11/11/2019, CONSULTATION DATE:  3/10 REFERRING MD:  Sabra Heck, CHIEF COMPLAINT:  Acute encephalopathy and respiratory failure    Brief History   59 year old female smoker admitted 3/10 with overdose complicated by aspiration PNA with MRSA,  Pneumococcal bacteremia, toxic metabolic encephalopathy and septic shock.    Past Medical History  Frequent falls at home,Tobacco abuse, diabetes with painful polyneuropathy, bipolar disease, GERD, HIV, schizophrenia, history of IV drug abuse, seizure disorder.   Significant Hospital Events   3/10 Admit with overdose, intubated on arrival, in shock   3/15 Ventilator asynchrony, heavy sedation & intermittent paralysis   3/16 Lasix drip.  Awaiting TEE.  Sputum w/ MRSA.   Coming back down on sedation, changed to [facility]. TEE showing NO EVIDENCE of endocarditis. ABX changed to pen G per ID. MRSA therapy complete. EF improved. Now up to 50% from 20-25% 3/17 Heavily sedated. Having episodes of bradycardia. Still massively overloaded (>14L), increasing lasix gtt to 8 mg from 4, adding metolazone.  3/19 Lasix to 40 every 12, changing RASS goal to -2, decrease in fentanyl, decreasing PEEP, anticipating trialing pressure support ventilation.  Watching upward trend of white blood cell count and low-grade fever 3/20 BM early am. Agitation early am > sedate post am PT psy meds 3/22 Decreased mentation and movement of extremities overnight, sent for Head CT which revealed acute vs subacute bilateral MCA infarcts  3/23 Lying in bed seen spontaneously moving lower and right upper extremity, unable to follow any commands.  3/24 Hypoglycemic episodes overnight, Weaning well 10/5 today, tmax 101.5 Remains on precedex gtt 1.2 3/29 tracheostomy placed  Consults:  ID Cards for TEE Neurology  Palliative care  Procedures:  ETT 3/10 >>  R Fem TLC (ER) 3/10  R IJ TLC 3/10  L IJ TLC 3/10 >> 3/17    RUE PICC 3/16 >>  Trach 3/29 >>  PEG 4/05 >>   Significant Diagnostic Tests:   CT brain 3/10 >> Normal head CT  EEG 3/10 >> Moderate to severe diffuse encephalopathy. No seizures  Urine drug screen 3/10 >> positive for THC, acetaminophen Q000111Q, salicylate <7, ETOH Q000111Q  ECHO 3/11 >> EF 20-25%, LV function severely diminished, Global Hypokinesis,LV hypertrophy, Grade 1 Diastolic Dysfunction, Elevated PASP, Dilated LA, MV regurgitation, small mobile density right coronary cusp  TEE 3/16 >> LVEF 55%, No LA/LAA thrombus or masses, Previously noted mass on the aortic valve is a node of Arancius.  Normal variant and no evidence of endocarditis.Trivial TR and trivial PR 3/23 MRI brain >>Acute nonhemorrhagic infarcts in the posterior MCA territory bilaterally. Otherwise no evidence of chronic ischemia. 3/23 MRA head >>No significant intracranial stenosis.  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 3/23 MRV head >> negative  Micro Data:  Influenza A/B 3/10 >> negative  COVID 3/10 >> negative  MRSA PCR 3/10 >> positive  BCx2 3/10 >> streptococcus pneumoniae >> S-rocephin BCx1 (aerobic only) 3/10 >> negative  Tracheal aspirate 3/11 >> MRSA BCx2 3/12 >> negative  BCx2 3/25 >> aerococcus  Resp cx 3/25 >> Klebsiella (R amp)   Antimicrobials:  Vancomycin 3/10 >> 3/16 Unasyn 3/10 >> 3/16 Pen G 3/16 >> 3/23 Cefepime 3/26 >> 3/31  Interim history/subjective:  On TC.  Objective   BP (!) 145/87 (BP Location: Left Wrist)   Pulse 86   Temp 98.2 F (36.8 C) (Oral)   Resp 18   Ht 5\' 3"  (1.6  m)   Wt 96.9 kg   LMP  (LMP Unknown)   SpO2 97%   BMI 37.84 kg/m   I/O last 3 completed shifts: In: 591.3 [I.V.:172.3; NG/GT:419] Out: [id number]; Stool:1]  Examination:   General - alert Eyes - pupils reactive ENT - trach site clean Cardiac - regular rate/rhythm, no murmur Chest - equal breath sounds b/l, no wheezing or rales Abdomen - soft, non tender, +  bowel sounds Extremities - no cyanosis, clubbing, or edema Skin - no rashes Neuro - moving in bed, not following commands  Resolved Hospital Problem list   Rhabdomyolysis, Elevated INR, Septic shock, MRSA and aspiration pneumonia, and Pneumococcal bacteremia, Aerococcus bacteremia, Klebsiella HCAP  Assessment & Plan:   Acute hypoxic/hypercapnic respiratory failure with ARDS from MRSA PNA and aspiration pneumonia. Tracheostomy status. Hx of asthma. - trach care - f/u with speech for PM valve - d/c trach sutures 4/06 - goal SpO2 > 92% - continue pulmicort, prn duoneb  Acute bilateral large bilateral posterior MCA infarcts. - PT/OT/Speech therapy  Hx of HIV Acute systolic heart failure with sepsis induced CM. Hx of HTN, HLD. Hx of schizophrenia, seizures. DM type II poorly controlled with hyperglycemia. - per primary team  PCCM will follow up weekly for trach care.  Call if help needed sooner.  Labs:   CMP Latest Ref Rng & Units 12/08/2019 12/07/2019 12/06/2019  Glucose 70 - 99 mg/dL 152(H) 135(H) 171(H)  BUN 6 - 20 mg/dL 21(H) 24(H) 23(H)  Creatinine 0.44 - 1.00 mg/dL 0.92 0.84 0.87  Sodium 135 - 145 mmol/L 139 141 140  Potassium 3.5 - 5.1 mmol/L 3.6 4.3 4.4  Chloride 98 - 111 mmol/L 97(L) 98 98  CO2 22 - 32 mmol/L 30 30 30   Calcium 8.9 - 10.3 mg/dL 9.1 8.7(L) 9.2  Total Protein 6.5 - 8.1 g/dL - - -  Total Bilirubin 0.3 - 1.2 mg/dL - - -  Alkaline Phos 38 - 126 U/L - - -  AST 15 - 41 U/L - - -  ALT 0 - 44 U/L - - -    CBC Latest Ref Rng & Units 12/08/2019 12/07/2019 12/05/2019  WBC 4.0 - 10.5 K/uL 12.7(H) 12.4(H) -  Hemoglobin 12.0 - 15.0 g/dL 10.0(L) 9.9(L) 10.5(L)  Hematocrit 36.0 - 46.0 % 33.0(L) 32.2(L) 31.0(L)  Platelets 150 - 400 K/uL 384 446(H) -    ABG    Component Value Date/Time   PHART 7.487 (H) 12/05/2019 0352   PCO2ART 51.0 (H) 12/05/2019 0352   PO2ART 52.0 (L) 12/05/2019 0352   HCO3 38.7 (H) 12/05/2019 0352   TCO2 40 (H) 12/05/2019 0352   ACIDBASEDEF  4.0 (H) 11/12/2019 1504   O2SAT 89.0 12/05/2019 0352    CBG (last 3)  Recent Labs    12/08/19 0017 12/08/19 0357 12/08/19 0804  GLUCAP 167* 138* 148*     Signature:  Chesley Mires, MD [place] Pager - [phone] 12/08/2019, 8:53 AM

## 2019-12-09 DIAGNOSIS — N170 Acute kidney failure with tubular necrosis: Secondary | ICD-10-CM | POA: Diagnosis not present

## 2019-12-09 DIAGNOSIS — J9601 Acute respiratory failure with hypoxia: Secondary | ICD-10-CM | POA: Diagnosis not present

## 2019-12-09 LAB — BASIC METABOLIC PANEL
Anion gap: 9 (ref 5–15)
BUN: 20 mg/dL (ref 6–20)
CO2: 33 mmol/L — ABNORMAL HIGH (ref 22–32)
Calcium: 9.1 mg/dL (ref 8.9–10.3)
Chloride: 96 mmol/L — ABNORMAL LOW (ref 98–111)
Creatinine, Ser: 0.74 mg/dL (ref 0.44–1.00)
GFR calc Af Amer: >60 mL/min (ref >60)
GFR calc non Af Amer: >60 mL/min (ref >60)
Glucose, Bld: 110 mg/dL — ABNORMAL HIGH (ref 70–99)
Potassium: 3.8 mmol/L (ref 3.5–5.1)
Sodium: 138 mmol/L (ref 135–145)

## 2019-12-09 LAB — GLUCOSE, CAPILLARY
Glucose-Capillary: 105 mg/dL — ABNORMAL HIGH (ref 70–99)
Glucose-Capillary: 107 mg/dL — ABNORMAL HIGH (ref 70–99)
Glucose-Capillary: 185 mg/dL — ABNORMAL HIGH (ref 70–99)
Glucose-Capillary: 188 mg/dL — ABNORMAL HIGH (ref 70–99)
Glucose-Capillary: 79 mg/dL (ref 70–99)
Glucose-Capillary: 85 mg/dL (ref 70–99)
Glucose-Capillary: 94 mg/dL (ref 70–99)

## 2019-12-09 LAB — CBC WITH DIFFERENTIAL/PLATELET
Abs Immature Granulocytes: 0.04 10*3/uL (ref 0.00–0.07)
Basophils Absolute: 0.1 10*3/uL (ref 0.0–0.1)
Basophils Relative: 1 %
Eosinophils Absolute: 0.6 10*3/uL — ABNORMAL HIGH (ref 0.0–0.5)
Eosinophils Relative: 7 %
HCT: 36.4 % (ref 36.0–46.0)
Hemoglobin: 11.2 g/dL — ABNORMAL LOW (ref 12.0–15.0)
Immature Granulocytes: 0 %
Lymphocytes Relative: 28 %
Lymphs Abs: 2.6 10*3/uL (ref 0.7–4.0)
MCH: 27.9 pg (ref 26.0–34.0)
MCHC: 30.8 g/dL (ref 30.0–36.0)
MCV: 90.8 fL (ref 80.0–100.0)
Monocytes Absolute: 0.9 10*3/uL (ref 0.1–1.0)
Monocytes Relative: 9 %
Neutro Abs: 5 10*3/uL (ref 1.7–7.7)
Neutrophils Relative %: 55 %
Platelets: 361 10*3/uL (ref 150–400)
RBC: 4.01 MIL/uL (ref 3.87–5.11)
RDW: 19.5 % — ABNORMAL HIGH (ref 11.5–15.5)
WBC: 9.1 10*3/uL (ref 4.0–10.5)
nRBC: 0 % (ref 0.0–0.2)

## 2019-12-09 NOTE — Progress Notes (Signed)
[facility] has declined patient. Daughter Thayer Headings has been made aware. Daughter will begin researching for choices and will be provided with a list when she comes to the hospital tomorrow 12/10/2019. Daughter gives verbal consent for CM to fax information out to facilities for bed offers.

## 2019-12-09 NOTE — Progress Notes (Signed)
Case Manager Spoke with Daughter Namia Christoffersen [phone]) who is in agreement to have her mother placed in [place]. Dauther gives verbal consent for CM to contact [facility] for placement.

## 2019-12-09 NOTE — NC FL2 (Signed)
[place] LEVEL OF CARE SCREENING TOOL     IDENTIFICATION  Patient Name: Susan [name] Birthdate: 1961/01/13 Sex: female Admission Date (Current Location): 11/11/2019  [facility] and [place] Number:  Herbalist and Address:  [facility] [place], [place] [address] [place], [place] [address]      Provider Number: [id number]  Attending Physician Name and Address:  Donne Hazel, MD  Relative Name and Phone Number:  Laesha Mumpower [phone]    Current Level of Care: Hospital Recommended Level of Care: [place] Prior Approval Number:    Date Approved/Denied:   PASRR Number:    Discharge Plan: SNF    Current Diagnoses: Patient Active Problem List   Diagnosis Date Noted  . Palliative care by specialist   . Adult failure to thrive   . Status post tracheostomy ([place])   . Cerebral embolism with cerebral infarction 11/24/2019  . Acute kidney injury (AKI) with acute tubular necrosis (ATN) (HCC)   . Acute metabolic encephalopathy   . Pneumonia 11/14/2019  . Aortic valve endocarditis 11/14/2019  . Pneumococcal bacteremia 11/13/2019  . Acute respiratory failure with hypoxemia ([place]) 11/11/2019  . Recurrent falls 10/15/2019  . Yeast infection 07/23/2019  . Constipation 02/26/2019  . Schizophrenia ([place]) 02/18/2019  . Acute asthma exacerbation 02/17/2019  . AKI (acute kidney injury) ([place]) 02/17/2019  . Acute respiratory failure with hypoxia ([place]) 02/16/2019  . Right hip pain 02/21/2018  . DNR (do not resuscitate) discussion 08/23/2017  . Weakness 06/24/2017  . Osteoarthritis 07/14/2016  . Abnormal hemoglobin ([place]) 04/18/2016  . Hot flashes 10/16/2015  . PTSD (post-traumatic stress disorder) 05/12/2015  . MDD (major depressive disorder), recurrent episode, moderate ([place]) 05/11/2015  . Drug-induced hypotension 05/11/2015  . Cannabis use disorder, severe, dependence ([place]) 05/11/2015  . Tobacco use disorder 05/11/2015  .  Diabetes mellitus type 2 without retinopathy ([place]) 01/28/2015  . Hereditary and idiopathic peripheral neuropathy 06/03/2013  . Dysfunctional uterine bleeding 06/04/2012  . Dyslipidemia   . GERD (gastroesophageal reflux disease)   . Schizoaffective disorder, bipolar type ([place]) 04/26/2011  . Morbid obesity ([place]) 04/26/2011  . HIV disease ([place]) 03/28/2011  . Hypertension 03/28/2011    Orientation RESPIRATION BLADDER Height & Weight     Self(self only)  Tracheostomy, O2(5L) External catheter(Has external catheter in place) Weight: 96.9 kg Height:  5\' 3"  (160 cm)  BEHAVIORAL SYMPTOMS/MOOD NEUROLOGICAL BOWEL NUTRITION STATUS  Dangerous to self, others or property(restless & agitation)   Incontinent Feeding tube(Jevity 1.2 continous at 33ml/hr)  AMBULATORY STATUS COMMUNICATION OF NEEDS Skin   Total Care Non-Verbally(Trach) Other (Comment)(foam to wounds located under left & right breast folds moisture associated skin demage to buttocks, skin tear to left buttocks)                       Personal Care Assistance Level of Assistance  Total care Bathing Assistance: Maximum assistance Feeding assistance: (tube feeds) Dressing Assistance: Maximum assistance Total Care Assistance: Maximum assistance   Functional Limitations Info  Sight, Hearing, Speech     Speech Info: Impaired(trach)    SPECIAL CARE FACTORS FREQUENCY  PT (By licensed PT), OT (By licensed OT)     PT Frequency: 5X OT Frequency: 5X            Contractures Contractures Info: Not present    Additional Factors Info  Code Status, Allergies, Psychotropic, Insulin Sliding Scale, Isolation Precautions, Suctioning Needs Code Status Info: Full Allergies Info: Lyrica Psychotropic  Info: Seroquel Twice daily, Haldol PRN Insulin Sliding Scale Info: Sliding scale and Lantus Isolation Precautions Info: Contact MRSA Suctioning Needs: As needed suction and trach care   Current Medications (12/09/2019):  This is the current  hospital active medication list Current Facility-Administered Medications  Medication Dose Route Frequency Provider Last Rate Last Admin  . 0.9 %  sodium chloride infusion   Intravenous PRN Collene Gobble, MD   Stopped at 12/07/19 0216  . amLODipine (NORVASC) tablet 10 mg  10 mg Per Tube Daily Collene Gobble, MD   10 mg at 12/09/19 0917  . aspirin chewable tablet 81 mg  81 mg Per Tube Daily Collene Gobble, MD   81 mg at 12/09/19 0917  . benztropine (COGENTIN) tablet 0.5 mg  0.5 mg Per Tube Daily Collene Gobble, MD   0.5 mg at 12/09/19 1036  . bisacodyl (DULCOLAX) suppository 10 mg  10 mg Rectal Daily PRN Collene Gobble, MD   10 mg at 11/19/19 1600  . budesonide (PULMICORT) nebulizer solution 0.5 mg  0.5 mg Nebulization BID Collene Gobble, MD   0.5 mg at 12/09/19 0751  . busPIRone (BUSPAR) tablet 15 mg  15 mg Per Tube TID Collene Gobble, MD   15 mg at 12/09/19 1417  . chlorhexidine (PERIDEX) 0.12 % solution 15 mL  15 mL Mouth Rinse BID Eliezer Bottom T, MD   15 mL at 12/09/19 1022  . Chlorhexidine Gluconate Cloth 2 % PADS 6 each  6 each Topical Daily Collene Gobble, MD   6 each at 12/09/19 1417  . cloNIDine (CATAPRES) tablet 0.2 mg  0.2 mg Per Tube TID Collene Gobble, MD   0.2 mg at 12/09/19 1417  . dolutegravir (TIVICAY) tablet 50 mg  50 mg Oral Daily Collene Gobble, MD   50 mg at 12/09/19 1040  . doxepin (SINEQUAN) capsule 50 mg  50 mg Per Tube QHS Collene Gobble, MD   50 mg at 12/08/19 2201  . emtricitabine-tenofovir AF (DESCOVY) 200-25 MG per tablet 1 tablet  1 tablet Per Tube Daily Collene Gobble, MD   1 tablet at 12/09/19 [phone]  . feeding supplement (JEVITY 1.2 CAL) liquid 1,000 mL  1,000 mL Per Tube Continuous Alma Friendly, MD 50 mL/hr at 12/08/19 1740 1,000 mL at 12/08/19 1740  . feeding supplement (PRO-STAT SUGAR FREE 64) liquid 30 mL  30 mL Per Tube TID Alma Friendly, MD   30 mL at 12/09/19 1417  . free water 200 mL  200 mL Per Tube Q4H Collene Gobble, MD   200  mL at 12/09/19 1415  . gabapentin (NEURONTIN) 250 MG/5ML solution 300 mg  300 mg Per Tube Q8H Collene Gobble, MD   300 mg at 12/09/19 1418  . hydrALAZINE (APRESOLINE) injection 10 mg  10 mg Intravenous Q6H PRN Collene Gobble, MD      . insulin aspart (novoLOG) injection 0-20 Units  0-20 Units Subcutaneous Q4H Collene Gobble, MD   4 Units at 12/09/19 1335  . insulin aspart (novoLOG) injection 10 Units  10 Units Subcutaneous Q4H Collene Gobble, MD   10 Units at 12/09/19 1313  . insulin glargine (LANTUS) injection 15 Units  15 Units Subcutaneous BID Collene Gobble, MD   15 Units at 12/09/19 1021  . ipratropium-albuterol (DUONEB) 0.5-2.5 (3) MG/3ML nebulizer solution 3 mL  3 mL Nebulization Q6H PRN Collene Gobble, MD   6 mL at 11/29/19  [id number]  . MEDLINE mouth rinse  15 mL Mouth Rinse q12n4p Eliezer Bottom T, MD   15 mL at 12/09/19 1021  . metoprolol tartrate (LOPRESSOR) 25 mg/10 mL oral suspension 12.5 mg  12.5 mg Per Tube BID Collene Gobble, MD   12.5 mg at 12/09/19 [id number]  . neomycin-bacitracin-polymyxin (NEOSPORIN) ointment packet 1 application  1 application Topical Daily Corrie Mckusick, DO   1 application at [id number]  . pantoprazole sodium (PROTONIX) 40 mg/20 mL oral suspension 40 mg  40 mg Per Tube Daily Collene Gobble, MD   40 mg at 12/09/19 0917  . polyethylene glycol (MIRALAX / GLYCOLAX) packet 17 g  17 g Oral Daily Collene Gobble, MD   17 g at 12/09/19 0917  . QUEtiapine (SEROQUEL) tablet 200 mg  200 mg Per Tube BID Collene Gobble, MD   200 mg at 12/09/19 0917  . rosuvastatin (CRESTOR) tablet 20 mg  20 mg Per Tube q1800 Collene Gobble, MD   20 mg at 12/08/19 1737  . sennosides (SENOKOT) 8.8 MG/5ML syrup 15 mL  15 mL Per Tube BID Collene Gobble, MD   15 mL at 12/09/19 0917  . sertraline (ZOLOFT) tablet 50 mg  50 mg Per Tube Daily Collene Gobble, MD   50 mg at 12/09/19 0917  . sodium chloride flush (NS) 0.9 % injection 10-40 mL  10-40 mL Intracatheter PRN Collene Gobble, MD      .  sodium phosphate (FLEET) 7-19 GM/118ML enema 1 enema  1 enema Rectal Daily PRN Collene Gobble, MD      . triamterene-hydrochlorothiazide (MAXZIDE-25) 37.5-25 MG per tablet 1 tablet  1 tablet Per Tube Daily Collene Gobble, MD   1 tablet at 12/09/19 [id number]     Discharge Medications: Please see discharge summary for a list of discharge medications.  Relevant Imaging Results:  Relevant Lab Results:   Additional Information [id number]  Angelita Ingles, RN

## 2019-12-09 NOTE — Progress Notes (Signed)
[facility] declined to offer bed. Daughter Thayer Headings has been updated and now gives CM consent to contact [facility] for bed offer. Raquel Sarna fron [facility] has been notified and will call CM back with a decision.

## 2019-12-09 NOTE — Consult Note (Signed)
   [facility] CM Inpatient Consult   12/09/2019  Susan [name] Aug 31, 1961 [id number]   Patient screened for length of stay out of ICU and with extreme high risk score for unplanned readmission in the [place] with [name].  Review of patient's medical record reveals patient is being recommended for skilled nursing facility [SNF].  Also, reviewed that a  palliative care consult in process noted.  Plan:  With a SNF disposition noted, there are currently no [facility] CM needs noted at this time.   Will sign off at transition.  For questions contact:   Natividad Brood, RN BSN [facility] Liaison  [phone] business mobile phone Toll free office [phone]  Fax number: [phone] [email or web address]

## 2019-12-09 NOTE — Progress Notes (Signed)
PROGRESS NOTE    Susan [name]  [id number] DOB: 12-13-1960 DOA: 11/11/2019 PCP: Hoyt Koch, MD    Brief Narrative:  59 year old female with medical history significant for HIV, diabetes with neuropathy, seizure disorder, tobacco abuse, bipolar disease, schizophrenia, history of IV drug abuse admitted 3/10 to ICU with overdose complicated by aspiration pneumonia with MRSA, pneumococcal bacteremia, toxic metabolic encephalopathy, septic shock and acute versus subacute bilateral MCA infarcts. Patient was intubated on 3/10, difficult to wean off, tracheostomy placed on 3/29.  [facility] hospitalist assumed care on 12/06/2019.  Assessment & Plan:   Principal Problem:   Acute respiratory failure with hypoxemia (HCC) Active Problems:   HIV disease ([place])   Drug-induced hypotension   DNR (do not resuscitate) discussion   AKI (acute kidney injury) ([place])   Pneumococcal bacteremia   Pneumonia   Aortic valve endocarditis   Acute kidney injury (AKI) with acute tubular necrosis (ATN) (HCC)   Acute metabolic encephalopathy   Cerebral embolism with cerebral infarction   Status post tracheostomy [facility])   Palliative care by specialist   Adult failure to thrive   Acute hypoxic/hypercapnic respiratory failure with ARDS from MRSA PNA and aspiration pneumonia Hx of asthma Intubated on 3/10, difficult to wean off, status post trach on 3/29 Currently on trach collar with 5 L of O2 Continue Pulmicort nebulizer, DuoNeb as needed VAP prevention order set Continue with suctioning and standard trach care as needed  Aerococcus bacteremia 3/25 Klebsiella bronchitis/pneumonia 3/25 Persistent leukocytosis Completed 7-day course cefepime on 4/1  Acute bilateral large bilateral posterior MCA infarcts - 3/23with severe encephalopathy TEE 3/16 with no sign of endocarditis  MRI brain c/w b/l posterior MCA territory infarcts; MRA/ MRV neg Continue aspirin, Crestor F/u PT/OT/SLP  Acute  metabolic encephalopathy/hospital delirium Multifactorial, including sepsis, hypoxia, with underlying history of schizophrenia/seizures, anoxic brain injury Persistent agitation as mentioned above Continue current regimen:Cogentin, BuSpar, doxepin, Neurontin, Zoloft, Seroquel Posey if needed, try to avoid/minimize benzodiazepines  Acute systolic heart failure with sepsis induced CM Hx of HTN, HLD Currently on clonidine, amlodipine, maxide, metoprolol ACE inhibitor on hold Continue Crestor as tolerated  DM type II poorly controlled HbA1C 12.3 from 11/11/19 SSI, lantus, hypoglycemic protocol, Accu-Cheks  HIV Continue home Descovy, Tivicay as tolerated  At risk malnutrition S/p PEG placement on 4/5, by IR Continue tube feeds  Obesity Lifestyle modification advised once able to understand  [place] Very poor prognosis due to multiple comorbidities Palliative consulted-daughter with very poor insight.   DVT prophylaxis: SCD's Code Status: Full Family Communication: Pt in room, family not at bedside Disposition Plan: From home, plan LTAC when bed available  Consultants:   PCCM  ID  Neurology Procedures:   Mechanical ventilation  TEE   Antimicrobials: Anti-infectives (From admission, onward)   Start     Dose/Rate Route Frequency Ordered Stop   12/07/19 1603  ceFAZolin (ANCEF) powder       As needed 12/07/19 1603 12/07/19 1603   12/07/19 1426  ceFAZolin (ANCEF) 2-4 GM/100ML-% IVPB    Note to Pharmacy: Rodney Booze   : cabinet override      12/07/19 1426 12/08/19 0229   12/07/19 0000  ceFAZolin (ANCEF) IVPB 2g/100 mL premix     2 g 200 mL/hr over 30 Minutes Intravenous To Radiology 12/04/19 1142 12/07/19 0108   11/27/19 1145  ceFEPIme (MAXIPIME) 2 g in sodium chloride 0.9 % 100 mL IVPB  Status:  Discontinued     2 g 200 mL/hr over 30 Minutes Intravenous Every 12  hours 11/27/19 1056 12/03/19 0954   11/17/19 1600  penicillin G potassium 9 Million Units in  dextrose 5 % 500 mL continuous infusion     9 Million Units 41.7 mL/hr over 12 Hours Intravenous Every 12 hours 11/17/19 1151 11/24/19 2359   11/16/19 1039  Ampicillin-Sulbactam (UNASYN) 3 g in sodium chloride 0.9 % 100 mL IVPB  Status:  Discontinued     3 g 200 mL/hr over 30 Minutes Intravenous Every 6 hours 11/16/19 1009 11/17/19 1151   11/13/19 1600  Ampicillin-Sulbactam (UNASYN) 3 g in sodium chloride 0.9 % 100 mL IVPB  Status:  Discontinued     3 g 200 mL/hr over 30 Minutes Intravenous Every 12 hours 11/13/19 1547 11/16/19 1009   11/13/19 1500  vancomycin (VANCOCIN) IVPB 1000 mg/200 mL premix  Status:  Discontinued     1,000 mg 200 mL/hr over 60 Minutes Intravenous Every 48 hours 11/13/19 0713 11/17/19 1549   11/13/19 1000  dolutegravir (TIVICAY) tablet 50 mg     50 mg Oral Daily 11/12/19 1251     11/13/19 1000  emtricitabine-tenofovir AF (DESCOVY) 200-25 MG per tablet 1 tablet     1 tablet Per Tube Daily 11/12/19 1251     11/12/19 1500  piperacillin-tazobactam (ZOSYN) IVPB 3.375 g  Status:  Discontinued     3.375 g 12.5 mL/hr over 240 Minutes Intravenous Every 8 hours 11/12/19 1359 11/13/19 1547   11/11/19 2200  Ampicillin-Sulbactam (UNASYN) 3 g in sodium chloride 0.9 % 100 mL IVPB  Status:  Discontinued     3 g 200 mL/hr over 30 Minutes Intravenous Every 8 hours 11/11/19 1723 11/12/19 1317   11/11/19 1721  vancomycin variable dose per unstable renal function (pharmacist dosing)  Status:  Discontinued      Does not apply See admin instructions 11/11/19 1723 11/13/19 0713   11/11/19 1700  vancomycin (VANCOCIN) 2,250 mg in sodium chloride 0.9 % 500 mL IVPB     2,250 mg 250 mL/hr over 120 Minutes Intravenous  Once 11/11/19 1648 11/11/19 2005   11/11/19 1645  bictegravir-emtricitabine-tenofovir AF (BIKTARVY) 50-200-25 MG per tablet 1 tablet  Status:  Discontinued     1 tablet Oral Daily 11/11/19 1632 11/11/19 1638   11/11/19 1645  bictegravir-emtricitabine-tenofovir AF (BIKTARVY)  50-200-25 MG per tablet 1 tablet  Status:  Discontinued    Note to Pharmacy: Give via feeding tube if able   1 tablet Oral Daily 11/11/19 1638 11/12/19 1251   11/11/19 1545  vancomycin (VANCOCIN) IVPB 1000 mg/200 mL premix  Status:  Discontinued     1,000 mg 200 mL/hr over 60 Minutes Intravenous  Once 11/11/19 1544 11/11/19 1648   11/11/19 1545  piperacillin-tazobactam (ZOSYN) IVPB 3.375 g     3.375 g 100 mL/hr over 30 Minutes Intravenous  Once 11/11/19 1544 11/11/19 1710       Subjective: Unable to assess given current mentation  Objective: Vitals:   12/09/19 0520 12/09/19 0755 12/09/19 0821 12/09/19 1621  BP: (!) 130/92     Pulse:   96   Resp: 18 (!) 22    Temp: 98.4 F (36.9 C)   97.7 F (36.5 C)  TempSrc: Oral   Axillary  SpO2: 100% 97%  100%  Weight:      Height:        Intake/Output Summary (Last 24 hours) at 12/09/2019 1836 Last data filed at 12/09/2019 0600 Gross per 24 hour  Intake 966.67 ml  Output 1000 ml  Net -33.33 ml  Filed Weights   12/05/19 2200 12/06/19 0347 12/07/19 0400  Weight: 100.1 kg 100.1 kg 96.9 kg    Examination:  General exam: Appears calm and comfortable  Respiratory system: Clear to auscultation. Respiratory effort normal. Cardiovascular system: S1 & S2 heard, Regular Gastrointestinal system: Abdomen is nondistended, soft and nontender. No organomegaly or masses felt. Normal bowel sounds heard. Central nervous system: Alert and oriented. No focal neurological deficits. Extremities: Symmetric 5 x 5 power. Skin: No rashes, lesions Psychiatry: Unable to assess given current mentation  Data Reviewed: I have personally reviewed following labs and imaging studies  CBC: Recent Labs  Lab 12/03/19 0350 12/05/19 0352 12/07/19 0500 12/08/19 0500 12/09/19 0521  WBC 12.9*  --  12.4* 12.7* 9.1  NEUTROABS 7.4  --  8.0* 8.0* 5.0  HGB 9.5* 10.5* 9.9* 10.0* 11.2*  HCT 31.3* 31.0* 32.2* 33.0* 36.4  MCV 89.9  --  89.2 89.9 90.8  PLT 511*  --   446* 384 [id number]   Basic Metabolic Panel: Recent Labs  Lab 12/04/19 0433 12/05/19 0352 12/05/19 0429 12/06/19 0337 12/07/19 0500 12/08/19 0500 12/09/19 0521  NA 141   < > 140 140 141 139 138  K 3.5   < > 3.6 4.4 4.3 3.6 3.8  CL 100  --  96* 98 98 97* 96*  CO2 31  --  33* 30 30 30  33*  GLUCOSE 152*  --  164* 171* 135* 152* 110*  BUN 31*  --  27* 23* 24* 21* 20  CREATININE 1.04*  --  0.86 0.87 0.84 0.92 0.74  CALCIUM 8.8*  --  8.9 9.2 8.7* 9.1 9.1  MG 2.0  --  1.9 1.9  --   --   --    < > = values in this interval not displayed.   GFR: Estimated Creatinine Clearance: 86 mL/min (by C-G formula based on SCr of 0.74 mg/dL). Liver Function Tests: No results for input(s): AST, ALT, ALKPHOS, BILITOT, PROT, ALBUMIN in the last 168 hours. No results for input(s): LIPASE, AMYLASE in the last 168 hours. No results for input(s): AMMONIA in the last 168 hours. Coagulation Profile: Recent Labs  Lab 12/07/19 0500  INR 1.0   Cardiac Enzymes: No results for input(s): CKTOTAL, CKMB, CKMBINDEX, TROPONINI in the last 168 hours. BNP (last 3 results) No results for input(s): PROBNP in the last 8760 hours. HbA1C: No results for input(s): HGBA1C in the last 72 hours. CBG: Recent Labs  Lab 12/09/19 0042 12/09/19 0410 12/09/19 0831 12/09/19 1332 12/09/19 1635  GLUCAP 105* 94 188* 185* 107*   Lipid Profile: No results for input(s): CHOL, HDL, LDLCALC, TRIG, CHOLHDL, LDLDIRECT in the last 72 hours. Thyroid Function Tests: No results for input(s): TSH, T4TOTAL, FREET4, T3FREE, THYROIDAB in the last 72 hours. Anemia Panel: No results for input(s): VITAMINB12, FOLATE, FERRITIN, TIBC, IRON, RETICCTPCT in the last 72 hours. Sepsis Labs: No results for input(s): PROCALCITON, LATICACIDVEN in the last 168 hours.  No results found for this or any previous visit (from the past 240 hour(s)).   Radiology Studies: No results found.  Scheduled Meds: . amLODipine  10 mg Per Tube Daily  . aspirin   81 mg Per Tube Daily  . benztropine  0.5 mg Per Tube Daily  . budesonide (PULMICORT) nebulizer solution  0.5 mg Nebulization BID  . busPIRone  15 mg Per Tube TID  . chlorhexidine  15 mL Mouth Rinse BID  . Chlorhexidine Gluconate Cloth  6 each Topical Daily  . cloNIDine  0.2 mg Per Tube TID  . dolutegravir  50 mg Oral Daily  . doxepin  50 mg Per Tube QHS  . emtricitabine-tenofovir AF  1 tablet Per Tube Daily  . feeding supplement (PRO-STAT SUGAR FREE 64)  30 mL Per Tube TID  . free water  200 mL Per Tube Q4H  . gabapentin  300 mg Per Tube Q8H  . insulin aspart  0-20 Units Subcutaneous Q4H  . insulin aspart  10 Units Subcutaneous Q4H  . insulin glargine  15 Units Subcutaneous BID  . mouth rinse  15 mL Mouth Rinse q12n4p  . metoprolol tartrate  12.5 mg Per Tube BID  . neomycin-bacitracin-polymyxin  1 application Topical Daily  . pantoprazole sodium  40 mg Per Tube Daily  . polyethylene glycol  17 g Oral Daily  . QUEtiapine  200 mg Per Tube BID  . rosuvastatin  20 mg Per Tube q1800  . sennosides  15 mL Per Tube BID  . sertraline  50 mg Per Tube Daily  . triamterene-hydrochlorothiazide  1 tablet Per Tube Daily   Continuous Infusions: . sodium chloride Stopped (12/07/19 0216)  . feeding supplement (JEVITY 1.2 CAL) 1,000 mL (12/08/19 1740)     LOS: 28 days   Marylu Lund, MD [facility] Pager On Amion  If 7PM-7AM, please contact night-coverage 12/09/2019, 6:36 PM

## 2019-12-10 DIAGNOSIS — J9601 Acute respiratory failure with hypoxia: Secondary | ICD-10-CM | POA: Diagnosis not present

## 2019-12-10 DIAGNOSIS — G9341 Metabolic encephalopathy: Secondary | ICD-10-CM | POA: Diagnosis not present

## 2019-12-10 LAB — CBC WITH DIFFERENTIAL/PLATELET
Abs Immature Granulocytes: 0.04 10*3/uL (ref 0.00–0.07)
Basophils Absolute: 0.1 10*3/uL (ref 0.0–0.1)
Basophils Relative: 1 %
Eosinophils Absolute: 0.5 10*3/uL (ref 0.0–0.5)
Eosinophils Relative: 5 %
HCT: 35.9 % — ABNORMAL LOW (ref 36.0–46.0)
Hemoglobin: 11.1 g/dL — ABNORMAL LOW (ref 12.0–15.0)
Immature Granulocytes: 0 %
Lymphocytes Relative: 27 %
Lymphs Abs: 2.5 10*3/uL (ref 0.7–4.0)
MCH: 27.8 pg (ref 26.0–34.0)
MCHC: 30.9 g/dL (ref 30.0–36.0)
MCV: 90 fL (ref 80.0–100.0)
Monocytes Absolute: 0.8 10*3/uL (ref 0.1–1.0)
Monocytes Relative: 8 %
Neutro Abs: 5.6 10*3/uL (ref 1.7–7.7)
Neutrophils Relative %: 59 %
Platelets: 364 10*3/uL (ref 150–400)
RBC: 3.99 MIL/uL (ref 3.87–5.11)
RDW: 19.3 % — ABNORMAL HIGH (ref 11.5–15.5)
WBC: 9.5 10*3/uL (ref 4.0–10.5)
nRBC: 0 % (ref 0.0–0.2)

## 2019-12-10 LAB — GLUCOSE, CAPILLARY
Glucose-Capillary: 146 mg/dL — ABNORMAL HIGH (ref 70–99)
Glucose-Capillary: 117 mg/dL — ABNORMAL HIGH (ref 70–99)
Glucose-Capillary: 193 mg/dL — ABNORMAL HIGH (ref 70–99)
Glucose-Capillary: 164 mg/dL — ABNORMAL HIGH (ref 70–99)
Glucose-Capillary: 166 mg/dL — ABNORMAL HIGH (ref 70–99)
Glucose-Capillary: 176 mg/dL — ABNORMAL HIGH (ref 70–99)

## 2019-12-10 LAB — BASIC METABOLIC PANEL
Anion gap: 9 (ref 5–15)
BUN: 25 mg/dL — ABNORMAL HIGH (ref 6–20)
CO2: 34 mmol/L — ABNORMAL HIGH (ref 22–32)
Calcium: 9.1 mg/dL (ref 8.9–10.3)
Chloride: 95 mmol/L — ABNORMAL LOW (ref 98–111)
Creatinine, Ser: 0.86 mg/dL (ref 0.44–1.00)
GFR calc Af Amer: >60 mL/min (ref >60)
GFR calc non Af Amer: >60 mL/min (ref >60)
Glucose, Bld: 147 mg/dL — ABNORMAL HIGH (ref 70–99)
Potassium: 3.7 mmol/L (ref 3.5–5.1)
Sodium: 138 mmol/L (ref 135–145)

## 2019-12-10 NOTE — Progress Notes (Signed)
Gastric residual 40 ml

## 2019-12-10 NOTE — Progress Notes (Signed)
  Speech Language Pathology Treatment: Nada Boozer Speaking valve  Patient Details Name: Susan [name] MRN: [id number] DOB: Dec 04, 1960 Today's Date: 12/10/2019 Time: [id number] SLP Time Calculation (min) (ACUTE ONLY): 14 min  Assessment / Plan / Recommendation Clinical Impression  Pt's pilot balloon is deflated at rest- pt easily woken and participatory in session including treatment with speaking valve and swallow assessment (see BSE report for details). She was calm although highly distracted. Air attempting to exit nose and mouth appears to be meeting resistance, likely from cuff even though deflated. Valve either fell off trach hub or was coughed off for duration of 3-4 minute period after which remained in place with vitals within normal range. Most of session she would not verbalize when requested due to cognitive impairments- looking out the window, the door, smiling at therapist. She verbalized only 3 times in a clear, adequate volume phrase. ST spoke with MD about changing to cuffless trach and this SLP will call RT to inquire. Recommend valve be worn with SLP, PT/OT at this time with full supervision.    HPI HPI: 59 year old female smoker admitted 3/10 with overdose complicated by aspiration PNA with MRSA,  Pneumococcal bacteremia, toxic metabolic encephalopathy and septic shock.        SLP Plan  Continue with current plan of care       Recommendations         Patient may use Passy-Muir Speech Valve: with SLP only PMSV Supervision: Full MD: Please consider changing trach tube to : Cuffless;Smaller size         Follow up Recommendations: Skilled Nursing facility SLP Visit Diagnosis: Aphonia (R49.1) Plan: Continue with current plan of care       GO                [name] 12/10/2019, 10:48 AM  [name] Caroli.Ed Risk analyst [phone] Office [phone]

## 2019-12-10 NOTE — Progress Notes (Signed)
Daughter Susan [name] was to come in today to receive list of choices for SNF beds. CM has called daughter with no answer. List of offers left at bedside per CM and daughters conversation on 12/09/2019. Will attempt to contact daughter later.

## 2019-12-10 NOTE — Progress Notes (Deleted)
Patient will require at least 30 days of skilled nursing services.

## 2019-12-10 NOTE — Progress Notes (Signed)
Occupational Therapy Treatment Patient Details Name: Susan [name] MRN: [id number] DOB: 1961/04/28 Today's Date: 12/10/2019    History of present illness 59 year old female smoker admitted 3/10 with overdose complicated by aspiration PNA with MRSA,  Pneumococcal bacteremia, toxic metabolic encephalopathy and septic shock.  Frequent falls at home,Tobacco abuse, diabetes with painful polyneuropathy, bipolar disease, GERD, HIV, schizophrenia, history of IV drug abuse, seizure disorder. Intubated 3/10-3/29 3/23 MRI brain >>Acute nonhemorrhagic infarcts in the posterior MCA territory bilaterally. Tracheostomy placed 3/29   OT comments  Pt talkative now that she has a PSMV. Pt was able to transfer to [facility] with +2 max to total assist this visit. Very unsafe, pulling at lines, attempting to stand impulsively. Pt able to participate in simple grooming activity with moderat assistance for thoroughness. Pt stating she wants to eat. Pt needing 4 point restraints for safety. VSS on 5L 35 % via trach.Will continue to follow.  Follow Up Recommendations  SNF;LTACH;Supervision/Assistance - 24 hour    Equipment Recommendations  Other (comment)(defer to next venue)    Recommendations for Other Services      Precautions / Restrictions Precautions Precautions: Fall Precaution Comments: per ST, may use PSMV during all therapy sessions       Mobility Bed Mobility Overal bed mobility: Needs Assistance Bed Mobility: Supine to Sit;Sit to Supine     Supine to sit: +2 for physical assistance;Mod assist Sit to supine: +2 for physical assistance;Max assist   General bed mobility comments: Pt able to bring her legs over EOB with tactile cue to initiate, pulled trunk up using therapist's hands, assist for all aspects to return to supine  Transfers Overall transfer level: Needs assistance Equipment used: 2 person hand held assist Transfers: Squat Pivot Transfers     Squat pivot transfers: +2 physical  assistance;Total assist;Max assist     General transfer comment: pt with significant weakness, attempted to assist to get from bed to [facility], resistant to returning from [facility] to chair    Balance Overall balance assessment: Needs assistance   Sitting balance-Leahy Scale: Fair Sitting balance - Comments: close min guard assist for safety at EOB and on BSC       Standing balance comment: unable to stand                           ADL either performed or assessed with clinical judgement   ADL Overall ADL's : Needs assistance/impaired     Grooming: Moderate assistance;Wash/dry face;Sitting Grooming Details (indicate cue type and reason): for thoroughness and to protect trach                 Toilet Transfer: +2 for safety/equipment;Maximal assistance;Total assistance;BSC;Squat-pivot             General ADL Comments: pt stating she needed to use the [facility], but then did not produce once seated on      Vision   Vision Assessment?: No apparent visual deficits   Perception     Praxis      Cognition Arousal/Alertness: Awake/alert Behavior During Therapy: Impulsive;Restless Overall Cognitive Status: Impaired/Different from baseline Area of Impairment: Orientation;Attention;Memory;Following commands;Safety/judgement;Awareness;Problem solving                 Orientation Level: Disoriented to;Place;Time;Situation Current Attention Level: Focused Memory: Decreased short-term memory;Decreased recall of precautions Following Commands: Follows one step commands with increased time;Follows one step commands inconsistently Safety/Judgement: Decreased awareness of safety;Decreased awareness of deficits Awareness: Intellectual Problem Solving: Decreased  initiation;Difficulty sequencing;Requires verbal cues;Requires tactile cues;Slow processing General Comments: pt asking for liquor, fruit salad and a hot dog        Exercises     Shoulder Instructions        General Comments      Pertinent Vitals/ Pain       Pain Assessment: Faces Faces Pain Scale: No hurt  Home Living                                          Prior Functioning/Environment              Frequency  Min 2X/week        Progress Toward Goals  OT Goals(current goals can now be found in the care plan section)  Progress towards OT goals: Progressing toward goals  Acute Rehab OT Goals Patient Stated Goal: to eat OT Goal Formulation: Patient unable to participate in goal setting Time For Goal Achievement: 12/16/19 Potential to Achieve Goals: [place] Discharge plan remains appropriate    Co-evaluation    PT/OT/SLP Co-Evaluation/Treatment: Yes Reason for Co-Treatment: Necessary to address cognition/behavior during functional activity;For patient/therapist safety   OT goals addressed during session: ADL's and self-care      AM-PAC OT "6 Clicks" Daily Activity     Outcome Measure   Help from another person eating meals?: Total Help from another person taking care of personal grooming?: A Lot Help from another person toileting, which includes using toliet, bedpan, or urinal?: Total Help from another person bathing (including washing, rinsing, drying)?: Total Help from another person to put on and taking off regular upper body clothing?: Total Help from another person to put on and taking off regular lower body clothing?: Total 6 Click Score: 7    End of Session Equipment Utilized During Treatment: Oxygen(5L 35% Fi02)  OT Visit Diagnosis: Other symptoms and signs involving cognitive function;Muscle weakness (generalized) (M62.81) Hemiplegia - Right/Left: Left Hemiplegia - caused by: Cerebral infarction   Activity Tolerance Patient tolerated treatment well   Patient Left in bed;with bed alarm set;with restraints reapplied;with call bell/phone within reach   Nurse Communication          Time: [id number] OT Time Calculation (min):  33 min  Charges: OT General Charges $OT Visit: 1 Visit OT Treatments $Self Care/Home Management : 8-22 mins  Nestor Lewandowsky, OTR/L Acute Rehabilitation Services Pager: [phone] Office: [phone]   Malka So 12/10/2019, 4:03 PM

## 2019-12-10 NOTE — Progress Notes (Signed)
Physical Therapy Treatment Patient Details Name: Susan [name] MRN: [id number] DOB: Jul 09, 1961 Today's Date: 12/10/2019    History of Present Illness 59 year old female smoker admitted 3/10 with overdose complicated by aspiration PNA with MRSA,  Pneumococcal bacteremia, toxic metabolic encephalopathy and septic shock.  Frequent falls at home,Tobacco abuse, diabetes with painful polyneuropathy, bipolar disease, GERD, HIV, schizophrenia, history of IV drug abuse, seizure disorder. Intubated 3/10-3/29 3/23 MRI brain >>Acute nonhemorrhagic infarcts in the posterior MCA territory bilaterally. Tracheostomy placed 3/29    PT Comments    Although pt unable to hold focus on task, she was able to redirect to task with repetitive cues.  Emphasis on transition, scooting, sitting balance, staying on task, transfer to [facility] because pt wanted to toilet, but returning to bed due to pt slowly escalating in restless behaviors.    Follow Up Recommendations  SNF     Equipment Recommendations  None recommended by PT    Recommendations for Other Services       Precautions / Restrictions Precautions Precautions: Fall Precaution Comments: per ST, may use PSMV during all therapy sessions    Mobility  Bed Mobility Overal bed mobility: Needs Assistance Bed Mobility: Supine to Sit;Sit to Supine     Supine to sit: +2 for physical assistance;Mod assist Sit to supine: +2 for physical assistance;Max assist   General bed mobility comments: Pt able to bring her legs over EOB with tactile cue to initiate, pulled trunk up using therapist's hands, assist for all aspects to return to supine  Transfers Overall transfer level: Needs assistance Equipment used: 2 person hand held assist Transfers: Squat Pivot Transfers     Squat pivot transfers: +2 physical assistance;Total assist;Max assist     General transfer comment: pt with significant weakness, attempted to assist to get from bed to [facility], resistant to  returning from [facility] to chair  Ambulation/Gait             General Gait Details: unable   Stairs             Wheelchair Mobility    Modified Rankin (Stroke Patients Only) Modified Rankin (Stroke Patients Only) Pre-Morbid Rankin Score: No symptoms Modified Rankin: Severe disability     Balance Overall balance assessment: Needs assistance   Sitting balance-Leahy Scale: Fair Sitting balance - Comments: close min guard assist for safety at EOB and on BSC       Standing balance comment: pt came up into a submaximal posture with significant assist, needing maximal face to face                             Cognition Arousal/Alertness: Awake/alert Behavior During Therapy: Impulsive;Restless Overall Cognitive Status: Impaired/Different from baseline Area of Impairment: Orientation;Attention;Memory;Following commands;Safety/judgement;Awareness;Problem solving                 Orientation Level: Disoriented to;Place;Time;Situation Current Attention Level: Focused Memory: Decreased short-term memory;Decreased recall of precautions Following Commands: Follows one step commands with increased time;Follows one step commands inconsistently Safety/Judgement: Decreased awareness of safety;Decreased awareness of deficits Awareness: Intellectual Problem Solving: Decreased initiation;Difficulty sequencing;Requires verbal cues;Requires tactile cues;Slow processing General Comments: pt asking for liquor, fruit salad and a hot dog      Exercises      General Comments        Pertinent Vitals/Pain Pain Assessment: Faces Pain Score: 0-No pain Faces Pain Scale: No hurt    Home Living  Prior Function            PT Goals (current goals can now be found in the care plan section) Acute Rehab PT Goals Patient Stated Goal: to eat PT Goal Formulation: Patient unable to participate in goal setting Time For Goal Achievement:  12/15/19 Potential to Achieve Goals: Fair Progress towards PT goals: Progressing toward goals    Frequency    Min 3X/week      PT Plan Current plan remains appropriate    Co-evaluation PT/OT/SLP Co-Evaluation/Treatment: Yes Reason for Co-Treatment: Necessary to address cognition/behavior during functional activity PT goals addressed during session: Mobility/safety with mobility OT goals addressed during session: ADL's and self-care      AM-PAC PT "6 Clicks" Mobility   Outcome Measure  Help needed turning from your back to your side while in a flat bed without using bedrails?: A Lot Help needed moving from lying on your back to sitting on the side of a flat bed without using bedrails?: A Lot Help needed moving to and from a bed to a chair (including a wheelchair)?: Total Help needed standing up from a chair using your arms (e.g., wheelchair or bedside chair)?: Total Help needed to walk in hospital room?: Total Help needed climbing 3-5 steps with a railing? : Total 6 Click Score: 8    End of Session         PT Visit Diagnosis: Unsteadiness on feet (R26.81);Other abnormalities of gait and mobility (R26.89);Repeated falls (R29.6);Muscle weakness (generalized) (M62.81);History of falling (Z91.81);Difficulty in walking, not elsewhere classified (R26.2);Hemiplegia and hemiparesis Hemiplegia - Right/Left: Left Hemiplegia - caused by: Cerebral infarction     Time: 1328-1401 PT Time Calculation (min) (ACUTE ONLY): 33 min  Charges:  $Therapeutic Activity: 8-22 mins                     12/10/2019  Ginger Carne., PT Acute Rehabilitation Services [phone]  (pager) [phone]  (office)   Tessie Fass Chantz Montefusco 12/10/2019, 6:17 PM
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
## 2019-12-10 NOTE — Progress Notes (Signed)
PROGRESS NOTE    Susan [name]  [id number] DOB: 09-May-1961 DOA: 11/11/2019 PCP: Hoyt Koch, MD    Brief Narrative:  59 year old female with medical history significant for HIV, diabetes with neuropathy, seizure disorder, tobacco abuse, bipolar disease, schizophrenia, history of IV drug abuse admitted 3/10 to ICU with overdose complicated by aspiration pneumonia with MRSA, pneumococcal bacteremia, toxic metabolic encephalopathy, septic shock and acute versus subacute bilateral MCA infarcts. Patient was intubated on 3/10, difficult to wean off, tracheostomy placed on 3/29.  [facility] assumed care on 12/06/2019.  Assessment & Plan:   Principal Problem:   Acute respiratory failure with hypoxemia (HCC) Active Problems:   HIV disease ([place])   Drug-induced hypotension   DNR (do not resuscitate) discussion   AKI (acute kidney injury) ([place])   Pneumococcal bacteremia   Pneumonia   Aortic valve endocarditis   Acute kidney injury (AKI) with acute tubular necrosis (ATN) (HCC)   Acute metabolic encephalopathy   Cerebral embolism with cerebral infarction   Status post tracheostomy [facility])   Palliative care by specialist   Adult failure to thrive   Acute hypoxic/hypercapnic respiratory failure with ARDS from MRSA PNA and aspiration pneumonia Hx of asthma Intubated on 3/10, difficult to wean off, status post trach on 3/29 Currently on trach collar with 5 L of O2 Continue Pulmicort nebulizer, DuoNeb as needed VAP prevention order set Will continue with suctioning and standard trach care as needed  Aerococcus bacteremia 3/25 Klebsiella bronchitis/pneumonia 3/25 Persistent leukocytosis Completed 7-day course cefepime on 4/1  Acute bilateral large bilateral posterior MCA infarcts - 3/23with severe encephalopathy TEE 3/16 with no sign of endocarditis  MRI brain c/w b/l posterior MCA territory infarcts; MRA/ MRV neg Continue aspirin, Crestor PT/OT consulted and  following  Acute metabolic encephalopathy/hospital delirium Multifactorial, including sepsis, hypoxia, with underlying history of schizophrenia/seizures, anoxic brain injury Persistent agitation as mentioned above Continue current regimen:Cogentin, BuSpar, doxepin, Neurontin, Zoloft, Seroquel Posey if needed, try to avoid/minimize benzodiazepines  Acute systolic heart failure with sepsis induced CM Hx of HTN, HLD Currently on clonidine, amlodipine, maxide, metoprolol ACE inhibitor on hold Continue with Crestor as tolerated  DM type II poorly controlled HbA1C 12.3 from 11/11/19 SSI, lantus, hypoglycemic protocol, Accu-Cheks  HIV Continue home Descovy, Tivicay as tolerated  At risk malnutrition S/p PEG placement on 4/5, by IR Continue tube feeds  Obesity Lifestyle modification advised once able to understand  [place] Very poor prognosis due to multiple comorbidities Palliative consulted-daughter with very poor insight.   DVT prophylaxis: SCD's Code Status: Full Family Communication: Pt in room, family not at bedside Disposition Plan: From home, plan LTAC when bed available  Consultants:   PCCM  ID  Neurology Procedures:   Mechanical ventilation  TEE   Antimicrobials: Anti-infectives (From admission, onward)   Start     Dose/Rate Route Frequency Ordered Stop   12/07/19 1603  ceFAZolin (ANCEF) powder       As needed 12/07/19 1603 12/07/19 1603   12/07/19 1426  ceFAZolin (ANCEF) 2-4 GM/100ML-% IVPB    Note to Pharmacy: Rodney Booze   : cabinet override      12/07/19 1426 12/08/19 0229   12/07/19 0000  ceFAZolin (ANCEF) IVPB 2g/100 mL premix     2 g 200 mL/hr over 30 Minutes Intravenous To Radiology 12/04/19 1142 12/07/19 0108   11/27/19 1145  ceFEPIme (MAXIPIME) 2 g in sodium chloride 0.9 % 100 mL IVPB  Status:  Discontinued     2 g 200 mL/hr over 30  Minutes Intravenous Every 12 hours 11/27/19 1056 12/03/19 0954   11/17/19 1600  penicillin G  potassium 9 Million Units in dextrose 5 % 500 mL continuous infusion     9 Million Units 41.7 mL/hr over 12 Hours Intravenous Every 12 hours 11/17/19 1151 11/24/19 2359   11/16/19 1039  Ampicillin-Sulbactam (UNASYN) 3 g in sodium chloride 0.9 % 100 mL IVPB  Status:  Discontinued     3 g 200 mL/hr over 30 Minutes Intravenous Every 6 hours 11/16/19 1009 11/17/19 1151   11/13/19 1600  Ampicillin-Sulbactam (UNASYN) 3 g in sodium chloride 0.9 % 100 mL IVPB  Status:  Discontinued     3 g 200 mL/hr over 30 Minutes Intravenous Every 12 hours 11/13/19 1547 11/16/19 1009   11/13/19 1500  vancomycin (VANCOCIN) IVPB 1000 mg/200 mL premix  Status:  Discontinued     1,000 mg 200 mL/hr over 60 Minutes Intravenous Every 48 hours 11/13/19 0713 11/17/19 1549   11/13/19 1000  dolutegravir (TIVICAY) tablet 50 mg     50 mg Oral Daily 11/12/19 1251     11/13/19 1000  emtricitabine-tenofovir AF (DESCOVY) 200-25 MG per tablet 1 tablet     1 tablet Per Tube Daily 11/12/19 1251     11/12/19 1500  piperacillin-tazobactam (ZOSYN) IVPB 3.375 g  Status:  Discontinued     3.375 g 12.5 mL/hr over 240 Minutes Intravenous Every 8 hours 11/12/19 1359 11/13/19 1547   11/11/19 2200  Ampicillin-Sulbactam (UNASYN) 3 g in sodium chloride 0.9 % 100 mL IVPB  Status:  Discontinued     3 g 200 mL/hr over 30 Minutes Intravenous Every 8 hours 11/11/19 1723 11/12/19 1317   11/11/19 1721  vancomycin variable dose per unstable renal function (pharmacist dosing)  Status:  Discontinued      Does not apply See admin instructions 11/11/19 1723 11/13/19 0713   11/11/19 1700  vancomycin (VANCOCIN) 2,250 mg in sodium chloride 0.9 % 500 mL IVPB     2,250 mg 250 mL/hr over 120 Minutes Intravenous  Once 11/11/19 1648 11/11/19 2005   11/11/19 1645  bictegravir-emtricitabine-tenofovir AF (BIKTARVY) 50-200-25 MG per tablet 1 tablet  Status:  Discontinued     1 tablet Oral Daily 11/11/19 1632 11/11/19 1638   11/11/19 1645   bictegravir-emtricitabine-tenofovir AF (BIKTARVY) 50-200-25 MG per tablet 1 tablet  Status:  Discontinued    Note to Pharmacy: Give via feeding tube if able   1 tablet Oral Daily 11/11/19 1638 11/12/19 1251   11/11/19 1545  vancomycin (VANCOCIN) IVPB 1000 mg/200 mL premix  Status:  Discontinued     1,000 mg 200 mL/hr over 60 Minutes Intravenous  Once 11/11/19 1544 11/11/19 1648   11/11/19 1545  piperacillin-tazobactam (ZOSYN) IVPB 3.375 g     3.375 g 100 mL/hr over 30 Minutes Intravenous  Once 11/11/19 1544 11/11/19 1710      Subjective: Cannot obtain given mentation  Objective: Vitals:   12/10/19 0722 12/10/19 0728 12/10/19 1100 12/10/19 1700  BP: 121/72  125/69 137/64  Pulse: 82     Resp: 18 18 15 11   Temp: 98.4 F (36.9 C)  98.2 F (36.8 C)   TempSrc: Axillary  Oral   SpO2: 99% 100% 96% 100%  Weight:      Height:        Intake/Output Summary (Last 24 hours) at 12/10/2019 1902 Last data filed at 12/10/2019 1251 Gross per 24 hour  Intake 400 ml  Output 1100 ml  Net -700 ml   Filed  Weights   12/05/19 2200 12/06/19 0347 12/07/19 0400  Weight: 100.1 kg 100.1 kg 96.9 kg    Examination: General exam: Awake, laying in bed, in nad Respiratory system: Normal respiratory effort, no wheezing, trach in place Cardiovascular system: regular rate, s1, s2 Gastrointestinal system: Soft, nondistended, positive BS Central nervous system: CN2-12 grossly intact, strength intact Extremities: Perfused, no clubbing Skin: Normal skin turgor, no notable skin lesions seen Psychiatry: Unable to assess given mentation  Data Reviewed: I have personally reviewed following labs and imaging studies  CBC: Recent Labs  Lab 12/05/19 0352 12/07/19 0500 12/08/19 0500 12/09/19 0521 12/10/19 0500  WBC  --  12.4* 12.7* 9.1 9.5  NEUTROABS  --  8.0* 8.0* 5.0 5.6  HGB 10.5* 9.9* 10.0* 11.2* 11.1*  HCT 31.0* 32.2* 33.0* 36.4 35.9*  MCV  --  89.2 89.9 90.8 90.0  PLT  --  446* 384 361 [id number]   Basic  Metabolic Panel: Recent Labs  Lab 12/04/19 0433 12/05/19 0352 12/05/19 0429 12/05/19 0429 12/06/19 0337 12/07/19 0500 12/08/19 0500 12/09/19 0521 12/10/19 0500  NA 141   < > 140   < > 140 141 139 138 138  K 3.5   < > 3.6   < > 4.4 4.3 3.6 3.8 3.7  CL 100  --  96*   < > 98 98 97* 96* 95*  CO2 31  --  33*   < > 30 30 30  33* 34*  GLUCOSE 152*  --  164*   < > 171* 135* 152* 110* 147*  BUN 31*  --  27*   < > 23* 24* 21* 20 25*  CREATININE 1.04*  --  0.86   < > 0.87 0.84 0.92 0.74 0.86  CALCIUM 8.8*  --  8.9   < > 9.2 8.7* 9.1 9.1 9.1  MG 2.0  --  1.9  --  1.9  --   --   --   --    < > = values in this interval not displayed.   GFR: Estimated Creatinine Clearance: 80 mL/min (by C-G formula based on SCr of 0.86 mg/dL). Liver Function Tests: No results for input(s): AST, ALT, ALKPHOS, BILITOT, PROT, ALBUMIN in the last 168 hours. No results for input(s): LIPASE, AMYLASE in the last 168 hours. No results for input(s): AMMONIA in the last 168 hours. Coagulation Profile: Recent Labs  Lab 12/07/19 0500  INR 1.0   Cardiac Enzymes: No results for input(s): CKTOTAL, CKMB, CKMBINDEX, TROPONINI in the last 168 hours. BNP (last 3 results) No results for input(s): PROBNP in the last 8760 hours. HbA1C: No results for input(s): HGBA1C in the last 72 hours. CBG: Recent Labs  Lab 12/09/19 2300 12/10/19 0337 12/10/19 0729 12/10/19 1100 12/10/19 1559  GLUCAP 79 146* 117* 193* 164*   Lipid Profile: No results for input(s): CHOL, HDL, LDLCALC, TRIG, CHOLHDL, LDLDIRECT in the last 72 hours. Thyroid Function Tests: No results for input(s): TSH, T4TOTAL, FREET4, T3FREE, THYROIDAB in the last 72 hours. Anemia Panel: No results for input(s): VITAMINB12, FOLATE, FERRITIN, TIBC, IRON, RETICCTPCT in the last 72 hours. Sepsis Labs: No results for input(s): PROCALCITON, LATICACIDVEN in the last 168 hours.  No results found for this or any previous visit (from the past 240 hour(s)).    Radiology Studies: No results found.  Scheduled Meds: . amLODipine  10 mg Per Tube Daily  . aspirin  81 mg Per Tube Daily  . benztropine  0.5 mg Per Tube Daily  . budesonide (  PULMICORT) nebulizer solution  0.5 mg Nebulization BID  . busPIRone  15 mg Per Tube TID  . chlorhexidine  15 mL Mouth Rinse BID  . Chlorhexidine Gluconate Cloth  6 each Topical Daily  . cloNIDine  0.2 mg Per Tube TID  . dolutegravir  50 mg Oral Daily  . doxepin  50 mg Per Tube QHS  . emtricitabine-tenofovir AF  1 tablet Per Tube Daily  . feeding supplement (PRO-STAT SUGAR FREE 64)  30 mL Per Tube TID  . free water  200 mL Per Tube Q4H  . gabapentin  300 mg Per Tube Q8H  . insulin aspart  0-20 Units Subcutaneous Q4H  . insulin aspart  10 Units Subcutaneous Q4H  . insulin glargine  15 Units Subcutaneous BID  . mouth rinse  15 mL Mouth Rinse q12n4p  . metoprolol tartrate  12.5 mg Per Tube BID  . neomycin-bacitracin-polymyxin  1 application Topical Daily  . pantoprazole sodium  40 mg Per Tube Daily  . polyethylene glycol  17 g Oral Daily  . QUEtiapine  200 mg Per Tube BID  . rosuvastatin  20 mg Per Tube q1800  . sennosides  15 mL Per Tube BID  . sertraline  50 mg Per Tube Daily  . triamterene-hydrochlorothiazide  1 tablet Per Tube Daily   Continuous Infusions: . sodium chloride Stopped (12/07/19 0216)  . feeding supplement (JEVITY 1.2 CAL) 1,000 mL (12/08/19 1740)     LOS: 29 days   Marylu Lund, MD [facility] Pager On Amion  If 7PM-7AM, please contact night-coverage 12/10/2019, 7:02 PM

## 2019-12-11 DIAGNOSIS — J9601 Acute respiratory failure with hypoxia: Secondary | ICD-10-CM | POA: Diagnosis not present

## 2019-12-11 LAB — CBC WITH DIFFERENTIAL/PLATELET
Abs Immature Granulocytes: 0.03 10*3/uL (ref 0.00–0.07)
Basophils Absolute: 0.1 10*3/uL (ref 0.0–0.1)
Basophils Relative: 1 %
Eosinophils Absolute: 0.5 10*3/uL (ref 0.0–0.5)
Eosinophils Relative: 5 %
HCT: 39.4 % (ref 36.0–46.0)
Hemoglobin: 12.3 g/dL (ref 12.0–15.0)
Immature Granulocytes: 0 %
Lymphocytes Relative: 34 %
Lymphs Abs: 3.8 10*3/uL (ref 0.7–4.0)
MCH: 28.1 pg (ref 26.0–34.0)
MCHC: 31.2 g/dL (ref 30.0–36.0)
MCV: 90.2 fL (ref 80.0–100.0)
Monocytes Absolute: 0.9 10*3/uL (ref 0.1–1.0)
Monocytes Relative: 8 %
Neutro Abs: 6.1 10*3/uL (ref 1.7–7.7)
Neutrophils Relative %: 52 %
Platelets: 380 10*3/uL (ref 150–400)
RBC: 4.37 MIL/uL (ref 3.87–5.11)
RDW: 19.5 % — ABNORMAL HIGH (ref 11.5–15.5)
WBC: 11.5 10*3/uL — ABNORMAL HIGH (ref 4.0–10.5)
nRBC: 0 % (ref 0.0–0.2)

## 2019-12-11 LAB — GLUCOSE, CAPILLARY
Comment 1: 320021249
Glucose-Capillary: 51 mg/dL — ABNORMAL LOW (ref 70–99)
Glucose-Capillary: 78 mg/dL (ref 70–99)
Glucose-Capillary: 77 mg/dL (ref 70–99)
Glucose-Capillary: 106 mg/dL — ABNORMAL HIGH (ref 70–99)
Glucose-Capillary: 100 mg/dL — ABNORMAL HIGH (ref 70–99)
Glucose-Capillary: 208 mg/dL — ABNORMAL HIGH (ref 70–99)
Glucose-Capillary: 141 mg/dL — ABNORMAL HIGH (ref 70–99)
Glucose-Capillary: 60 mg/dL — ABNORMAL LOW (ref 70–99)
Glucose-Capillary: 133 mg/dL — ABNORMAL HIGH (ref 70–99)

## 2019-12-11 LAB — BASIC METABOLIC PANEL
Anion gap: 12 (ref 5–15)
BUN: 29 mg/dL — ABNORMAL HIGH (ref 6–20)
CO2: 31 mmol/L (ref 22–32)
Calcium: 9.4 mg/dL (ref 8.9–10.3)
Chloride: 95 mmol/L — ABNORMAL LOW (ref 98–111)
Creatinine, Ser: 0.86 mg/dL (ref 0.44–1.00)
GFR calc Af Amer: >60 mL/min (ref >60)
GFR calc non Af Amer: >60 mL/min (ref >60)
Glucose, Bld: 61 mg/dL — ABNORMAL LOW (ref 70–99)
Potassium: 3.9 mmol/L (ref 3.5–5.1)
Sodium: 138 mmol/L (ref 135–145)

## 2019-12-11 MED ORDER — DEXTROSE 50 % IV SOLN
INTRAVENOUS | Status: AC
  Administered 2019-12-11 (×2): 25 mL
  Filled 2019-12-11: qty 50

## 2019-12-11 MED ORDER — DEXTROSE 50 % IV SOLN
INTRAVENOUS | Status: AC
  Administered 2019-12-11: 50 mL
  Filled 2019-12-11: qty 50

## 2019-12-11 MED ORDER — INSULIN GLARGINE 100 UNIT/ML ~~LOC~~ SOLN
10.0000 [IU] | Freq: Two times a day (BID) | SUBCUTANEOUS | Status: DC
  Administered 2019-12-11 – 2019-12-29 (×35): 10 [IU] via SUBCUTANEOUS
  Filled 2019-12-11 (×38): qty 0.1

## 2019-12-11 MED ORDER — INSULIN ASPART 100 UNIT/ML ~~LOC~~ SOLN
5.0000 [IU] | SUBCUTANEOUS | Status: DC
  Administered 2019-12-11 – 2019-12-24 (×63): 5 [IU] via SUBCUTANEOUS

## 2019-12-11 NOTE — Plan of Care (Signed)
  Problem: Education: Goal: Knowledge of General Education information will improve Description: Including pain rating scale, medication(s)/side effects and non-pharmacologic comfort measures Outcome: Not Progressing   Problem: Health Behavior/Discharge Planning: Goal: Ability to manage health-related needs will improve Outcome: Not Progressing   Problem: Clinical Measurements: Goal: Ability to maintain clinical measurements within normal limits will improve Outcome: Not Progressing Goal: Will remain free from infection Outcome: Not Progressing Goal: Diagnostic test results will improve Outcome: Not Progressing Goal: Respiratory complications will improve Outcome: Not Progressing Goal: Cardiovascular complication will be avoided Outcome: Not Progressing   Problem: Activity: Goal: Risk for activity intolerance will decrease Outcome: Not Progressing   Problem: Coping: Goal: Level of anxiety will decrease Outcome: Not Progressing   Problem: Elimination: Goal: Will not experience complications related to bowel motility Outcome: Not Progressing Goal: Will not experience complications related to urinary retention Outcome: Not Progressing   Problem: Pain Managment: Goal: General experience of comfort will improve Outcome: Not Progressing   Problem: Safety: Goal: Ability to remain free from injury will improve Outcome: Not Progressing   Problem: Skin Integrity: Goal: Risk for impaired skin integrity will decrease Outcome: Not Progressing   Problem: Education: Goal: Knowledge of disease or condition will improve Outcome: Not Progressing Goal: Knowledge of secondary prevention will improve Outcome: Not Progressing Goal: Knowledge of patient specific risk factors addressed and post discharge goals established will improve Outcome: Not Progressing

## 2019-12-11 NOTE — Progress Notes (Signed)
0328 Pt CBG is 51. Half an amp of dextrose given.  0359, CBG is 78, will monitor.   0437, CBG is 77, other half of amp given

## 2019-12-11 NOTE — Progress Notes (Signed)
4 point restraints removed at 1140. Patient tolerating well

## 2019-12-11 NOTE — Progress Notes (Signed)
Patient trach was bloody with thick mucus plugs. Changed inner cannula. Lavaged secretions to loosen plugs.

## 2019-12-11 NOTE — TOC Progression Note (Signed)
Transition of Care [facility]) - Progression Note    Patient Details  Name: Susan [name] MRN: [id number] Date of Birth: 12-05-1960  Transition of [facility]) CM/SW Contact  Angelita Ingles, RN Phone Number: 12/11/2019, 10:40 AM  Clinical Narrative:  CM attempted to contact daughter yesterday  12/10/2019 and this am 12/11/19 1040am. Message has been left for daughter in reference to bed searches. Awaiting return call. Insurance authorization  has been faxed, FL2 completed, and PASSR is still in progress. Will continue to follow patient for SNF workup and any other needs.     Expected Discharge Plan: Skilled Nursing Facility Barriers to Discharge: Continued Medical Work up  Expected Discharge Plan and Services Expected Discharge Plan: [place]                                               Social Determinants of Health (SDOH) Interventions    Readmission Risk Interventions Readmission Risk Prevention Plan 02/19/2019  Transportation Screening Complete  Medication Review Press photographer) Complete  PCP or Specialist appointment within 3-5 days of discharge Complete  HRI or [place] Complete  SW Recovery Care/Counseling Consult Complete  [place] Not Applicable  Some recent data might be hidden

## 2019-12-11 NOTE — Progress Notes (Signed)
Physical Therapy Treatment Patient Details Name: Susan [name] MRN: [id number] DOB: 1960-11-27 Today's Date: 12/11/2019    History of Present Illness 59 year old female smoker admitted 3/10 with overdose complicated by aspiration PNA with MRSA,  Pneumococcal bacteremia, toxic metabolic encephalopathy and septic shock.  Frequent falls at home,Tobacco abuse, diabetes with painful polyneuropathy, bipolar disease, GERD, HIV, schizophrenia, history of IV drug abuse, seizure disorder. Intubated 3/10-3/29 3/23 MRI brain >>Acute nonhemorrhagic infarcts in the posterior MCA territory bilaterally. Tracheostomy placed 3/29    PT Comments    Pt smiling on entry, and appreciative of therapy stating "thank you" multiple times. Pt is limited in safe mobility today by decreased safety due to decreased command follow and increased impulsivity. Pt able to assist in some bed level therapeutic exercise and is modA to get to EoB. Once EoB pt with increased attempts to stand up despite feet slipping forward on floor. Max multimodal cues and physical blocking required to keep pt from sliding off bed. Once settled down attempted to work on seated exercise, but pt with increased fatigue lying on her side. MaxAx2 for returning pt to supine. D/c plans remain appropriate at this time. PT will continue to follow acutely.     Follow Up Recommendations  SNF     Equipment Recommendations  None recommended by PT       Precautions / Restrictions Precautions Precautions: Fall Precaution Comments: 4 point restraint for safety with lines and leads, per ST, may use PSMV during all therapy sessions Restrictions Weight Bearing Restrictions: No    Mobility  Bed Mobility Overal bed mobility: Needs Assistance Bed Mobility: Supine to Sit;Sit to Supine     Supine to sit: +2 for physical assistance;Mod assist Sit to supine: +2 for physical assistance;Max assist   General bed mobility comments: Pt requires increased multimodal  cues for bringing LE to EOB and ultimately requires assist for movement however pt able to bring trunk into longsitting. End of session occurred when pt laid down on side and requires max A to bring LE into bed and position trunk  Transfers                 General transfer comment: did not attempt today as pt with limited command follow and increased pushing up and forward flexion sitting EoB  Ambulation/Gait             General Gait Details: unable      Modified Rankin (Stroke Patients Only) Modified Rankin (Stroke Patients Only) Pre-Morbid Rankin Score: No symptoms Modified Rankin: Severe disability     Balance Overall balance assessment: Needs assistance   Sitting balance-Leahy Scale: Fair Sitting balance - Comments: close min guard assist for safety at EOB and on BSC                                    Cognition Arousal/Alertness: Awake/alert Behavior During Therapy: Impulsive;Restless Overall Cognitive Status: Impaired/Different from baseline Area of Impairment: Orientation;Attention;Memory;Following commands;Safety/judgement;Awareness;Problem solving                 Orientation Level: Disoriented to;Place;Time;Situation Current Attention Level: Focused Memory: Decreased short-term memory;Decreased recall of precautions Following Commands: Follows one step commands with increased time;Follows one step commands inconsistently Safety/Judgement: Decreased awareness of safety;Decreased awareness of deficits Awareness: Intellectual Problem Solving: Decreased initiation;Difficulty sequencing;Requires verbal cues;Requires tactile cues;Slow processing General Comments: pt with limited vocalization today       Exercises  General Exercises - Lower Extremity Heel Slides: AAROM;Both;5 reps;Seated    General Comments General comments (skin integrity, edema, etc.): VSS on trach collar       Pertinent Vitals/Pain Pain Assessment: No/denies  pain Faces Pain Scale: No hurt           PT Goals (current goals can now be found in the care plan section) Acute Rehab PT Goals Patient Stated Goal: to eat PT Goal Formulation: Patient unable to participate in goal setting Time For Goal Achievement: 12/15/19 Potential to Achieve Goals: Fair Progress towards PT goals: Progressing toward goals    Frequency    Min 3X/week      PT Plan Current plan remains appropriate       AM-PAC PT "6 Clicks" Mobility   Outcome Measure  Help needed turning from your back to your side while in a flat bed without using bedrails?: A Lot Help needed moving from lying on your back to sitting on the side of a flat bed without using bedrails?: A Lot Help needed moving to and from a bed to a chair (including a wheelchair)?: Total Help needed standing up from a chair using your arms (e.g., wheelchair or bedside chair)?: Total Help needed to walk in hospital room?: Total Help needed climbing 3-5 steps with a railing? : Total 6 Click Score: 8    End of Session Equipment Utilized During Treatment: Oxygen Activity Tolerance: Treatment limited secondary to agitation;Other (comment)(impulsivity and decreased ability to command follow) Patient left: in bed;with bed alarm set;with restraints reapplied Nurse Communication: Mobility status PT Visit Diagnosis: Unsteadiness on feet (R26.81);Other abnormalities of gait and mobility (R26.89);Repeated falls (R29.6);Muscle weakness (generalized) (M62.81);History of falling (Z91.81);Difficulty in walking, not elsewhere classified (R26.2);Hemiplegia and hemiparesis Hemiplegia - Right/Left: Left Hemiplegia - caused by: Cerebral infarction     Time: [id number] PT Time Calculation (min) (ACUTE ONLY): 27 min  Charges:  $Therapeutic Exercise: 8-22 mins $Therapeutic Activity: 8-22 mins                     Azarya Oconnell B. Migdalia Dk PT, DPT Acute Rehabilitation Services Pager [phone] Office [phone]    [place] 12/11/2019, 2:36 PM

## 2019-12-11 NOTE — Progress Notes (Signed)
4 Point restraint release trial failed. PT combative, trying to get up, pulling at lines and tubes. Restraints reapplied as ordered.

## 2019-12-11 NOTE — TOC Progression Note (Signed)
Transition of Care [facility]) - Progression Note    Patient Details  Name: Susan [name] MRN: [id number] Date of Birth: [birth year]  Transition of Care [facility]) CM/SW Contact  Angelita Ingles, RN Phone Number: 12/11/2019, 4:14 PM  Clinical Narrative:   PASRR phone assessment completed. Awaiting approval. CM has called daughter with no return call for bed decision. Insurance Josem Kaufmann is pending . 4 point restraints removed by bedside nurse. Mittens placed. Will continue to follow.     Expected Discharge Plan: Skilled Nursing Facility Barriers to Discharge: Continued Medical Work up  Expected Discharge Plan and Services Expected Discharge Plan: [place]                                               Social Determinants of Health (SDOH) Interventions    Readmission Risk Interventions Readmission Risk Prevention Plan 02/19/2019  Transportation Screening Complete  Medication Review Press photographer) Complete  PCP or Specialist appointment within 3-5 days of discharge Complete  HRI or [place] Complete  SW Recovery Care/Counseling Consult Complete  [place] Not Applicable  Some recent data might be hidden

## 2019-12-11 NOTE — Progress Notes (Signed)
PROGRESS NOTE    Susan [name]  [id number] DOB: Nov 08, 1960 DOA: 11/11/2019 PCP: Hoyt Koch, MD    Brief Narrative:  59 year old female with medical history significant for HIV, diabetes with neuropathy, seizure disorder, tobacco abuse, bipolar disease, schizophrenia, history of IV drug abuse admitted 3/10 to ICU with overdose complicated by aspiration pneumonia with MRSA, pneumococcal bacteremia, toxic metabolic encephalopathy, septic shock and acute versus subacute bilateral MCA infarcts. Patient was intubated on 3/10, difficult to wean off, tracheostomy placed on 3/29.  [facility] hospital[facility] assumed care on 12/06/2019.  Assessment & Plan:   Principal Problem:   Acute respiratory failure with hypoxemia (HCC) Active Problems:   HIV disease ([place])   Drug-induced hypotension   DNR (do not resuscitate) discussion   AKI (acute kidney injury) ([place])   Pneumococcal bacteremia   Pneumonia   Aortic valve endocarditis   Acute kidney injury (AKI) with acute tubular necrosis (ATN) (HCC)   Acute metabolic encephalopathy   Cerebral embolism with cerebral infarction   Status post tracheostomy [facility])   Palliative care by specialist   Adult failure to thrive   Acute hypoxic/hypercapnic respiratory failure with ARDS from MRSA PNA and aspiration pneumonia Hx of asthma Intubated on 3/10, difficult to wean off, status post trach on 3/29 Currently on trach collar with 8 L of O2 Continue Pulmicort nebulizer, DuoNeb as needed VAP prevention order set Continue with suctioning and standard trach care as needed  Aerococcus bacteremia 3/25 Klebsiella bronchitis/pneumonia 3/25 Persistent leukocytosis Completed 7-day course cefepime as of 4/1  Acute bilateral large bilateral posterior MCA infarcts - 3/23with severe encephalopathy TEE 3/16 with no sign of endocarditis  MRI brain c/w b/l posterior MCA territory infarcts; MRA/ MRV neg Continue aspirin, Crestor PT/OT consulted and had been  following  Acute metabolic encephalopathy/hospital delirium Multifactorial, including sepsis, hypoxia, with underlying history of schizophrenia/seizures, anoxic brain injury Persistent agitation as mentioned above Continue current regimen:Cogentin, BuSpar, doxepin, Neurontin, Zoloft, Seroquel Patient continues to try to climb out of bed and pull at trach site, thus being a danger to herself. Will need to continue soft restraints for patient safety  Acute systolic heart failure with sepsis induced CM Hx of HTN, HLD Currently on clonidine, amlodipine, maxide, metoprolol ACE inhibitor on hold Continue with Crestor as tolerated  DM type II poorly controlled HbA1C 12.3 from 11/11/19 SSI, lantus, hypoglycemic protocol, continue Accu-Cheks  HIV Continue home Descovy, Tivicay as tolerated  At risk malnutrition S/p PEG placement on 4/5, by IR Continue tube feeds  Obesity Lifestyle modification advised once able to understand  [place] Very poor prognosis due to multiple comorbidities Palliative consulted and had been following   DVT prophylaxis: SCD's Code Status: Full Family Communication: Pt in room, family not at bedside Disposition Plan: From home, plan LTAC when bed available  Consultants:   PCCM  ID  Neurology Procedures:   Mechanical ventilation  TEE   Antimicrobials: Anti-infectives (From admission, onward)   Start     Dose/Rate Route Frequency Ordered Stop   12/07/19 1603  ceFAZolin (ANCEF) powder       As needed 12/07/19 1603 12/07/19 1603   12/07/19 1426  ceFAZolin (ANCEF) 2-4 GM/100ML-% IVPB    Note to Pharmacy: Rodney Booze   : cabinet override      12/07/19 1426 12/08/19 0229   12/07/19 0000  ceFAZolin (ANCEF) IVPB 2g/100 mL premix     2 g 200 mL/hr over 30 Minutes Intravenous To Radiology 12/04/19 1142 12/07/19 0108   11/27/19 1145  ceFEPIme (  MAXIPIME) 2 g in sodium chloride 0.9 % 100 mL IVPB  Status:  Discontinued     2 g 200 mL/hr over  30 Minutes Intravenous Every 12 hours 11/27/19 1056 12/03/19 0954   11/17/19 1600  penicillin G potassium 9 Million Units in dextrose 5 % 500 mL continuous infusion     9 Million Units 41.7 mL/hr over 12 Hours Intravenous Every 12 hours 11/17/19 1151 11/24/19 2359   11/16/19 1039  Ampicillin-Sulbactam (UNASYN) 3 g in sodium chloride 0.9 % 100 mL IVPB  Status:  Discontinued     3 g 200 mL/hr over 30 Minutes Intravenous Every 6 hours 11/16/19 1009 11/17/19 1151   11/13/19 1600  Ampicillin-Sulbactam (UNASYN) 3 g in sodium chloride 0.9 % 100 mL IVPB  Status:  Discontinued     3 g 200 mL/hr over 30 Minutes Intravenous Every 12 hours 11/13/19 1547 11/16/19 1009   11/13/19 1500  vancomycin (VANCOCIN) IVPB 1000 mg/200 mL premix  Status:  Discontinued     1,000 mg 200 mL/hr over 60 Minutes Intravenous Every 48 hours 11/13/19 0713 11/17/19 1549   11/13/19 1000  dolutegravir (TIVICAY) tablet 50 mg     50 mg Oral Daily 11/12/19 1251     11/13/19 1000  emtricitabine-tenofovir AF (DESCOVY) 200-25 MG per tablet 1 tablet     1 tablet Per Tube Daily 11/12/19 1251     11/12/19 1500  piperacillin-tazobactam (ZOSYN) IVPB 3.375 g  Status:  Discontinued     3.375 g 12.5 mL/hr over 240 Minutes Intravenous Every 8 hours 11/12/19 1359 11/13/19 1547   11/11/19 2200  Ampicillin-Sulbactam (UNASYN) 3 g in sodium chloride 0.9 % 100 mL IVPB  Status:  Discontinued     3 g 200 mL/hr over 30 Minutes Intravenous Every 8 hours 11/11/19 1723 11/12/19 1317   11/11/19 1721  vancomycin variable dose per unstable renal function (pharmacist dosing)  Status:  Discontinued      Does not apply See admin instructions 11/11/19 1723 11/13/19 0713   11/11/19 1700  vancomycin (VANCOCIN) 2,250 mg in sodium chloride 0.9 % 500 mL IVPB     2,250 mg 250 mL/hr over 120 Minutes Intravenous  Once 11/11/19 1648 11/11/19 2005   11/11/19 1645  bictegravir-emtricitabine-tenofovir AF (BIKTARVY) 50-200-25 MG per tablet 1 tablet  Status:  Discontinued      1 tablet Oral Daily 11/11/19 1632 11/11/19 1638   11/11/19 1645  bictegravir-emtricitabine-tenofovir AF (BIKTARVY) 50-200-25 MG per tablet 1 tablet  Status:  Discontinued    Note to Pharmacy: Give via feeding tube if able   1 tablet Oral Daily 11/11/19 1638 11/12/19 1251   11/11/19 1545  vancomycin (VANCOCIN) IVPB 1000 mg/200 mL premix  Status:  Discontinued     1,000 mg 200 mL/hr over 60 Minutes Intravenous  Once 11/11/19 1544 11/11/19 1648   11/11/19 1545  piperacillin-tazobactam (ZOSYN) IVPB 3.375 g     3.375 g 100 mL/hr over 30 Minutes Intravenous  Once 11/11/19 1544 11/11/19 1710      Subjective: Unable to assess given current mentation. Does ask "How are you doing?" but does not answer interviewer's questions appropriately or follow command this AM  Objective: Vitals:   12/11/19 0751 12/11/19 0827 12/11/19 0830 12/11/19 1549  BP: (!) 96/52  (!) 96/52 114/84  Pulse: 77  84   Resp: 17  20   Temp: 98.6 F (37 C)     TempSrc: Oral     SpO2: 100% 100% 100%   Weight:  Height:        Intake/Output Summary (Last 24 hours) at 12/11/2019 1556 Last data filed at 12/11/2019 0343 Gross per 24 hour  Intake 0 ml  Output 1100 ml  Net -1100 ml   Filed Weights   12/06/19 0347 12/07/19 0400 12/11/19 0600  Weight: 100.1 kg 96.9 kg 91.1 kg    Examination: General exam: Conversant, in no acute distress Respiratory system: normal chest rise, clear, no audible wheezing Cardiovascular system: regular rhythm, s1-s2 Gastrointestinal system: Nondistended, nontender, pos BS Central nervous system: No seizures, no tremors Extremities: No cyanosis, no joint deformities, soft restraints in place Skin: No rashes, no pallor Psychiatry: Affect normal // no auditory hallucinations   Data Reviewed: I have personally reviewed following labs and imaging studies  CBC: Recent Labs  Lab 12/07/19 0500 12/08/19 0500 12/09/19 0521 12/10/19 0500 12/11/19 0304  WBC 12.4* 12.7* 9.1 9.5 11.5*    NEUTROABS 8.0* 8.0* 5.0 5.6 6.1  HGB 9.9* 10.0* 11.2* 11.1* 12.3  HCT 32.2* 33.0* 36.4 35.9* 39.4  MCV 89.2 89.9 90.8 90.0 90.2  PLT 446* 384 361 364 [id number]   Basic Metabolic Panel: Recent Labs  Lab 12/05/19 0429 12/05/19 0429 12/06/19 0337 12/06/19 0337 12/07/19 0500 12/08/19 0500 12/09/19 0521 12/10/19 0500 12/11/19 0304  NA 140   < > 140   < > 141 139 138 138 138  K 3.6   < > 4.4   < > 4.3 3.6 3.8 3.7 3.9  CL 96*   < > 98   < > 98 97* 96* 95* 95*  CO2 33*   < > 30   < > 30 30 33* 34* 31  GLUCOSE 164*   < > 171*   < > 135* 152* 110* 147* 61*  BUN 27*   < > 23*   < > 24* 21* 20 25* 29*  CREATININE 0.86   < > 0.87   < > 0.84 0.92 0.74 0.86 0.86  CALCIUM 8.9   < > 9.2   < > 8.7* 9.1 9.1 9.1 9.4  MG 1.9  --  1.9  --   --   --   --   --   --    < > = values in this interval not displayed.   GFR: Estimated Creatinine Clearance: 77.4 mL/min (by C-G formula based on SCr of 0.86 mg/dL). Liver Function Tests: No results for input(s): AST, ALT, ALKPHOS, BILITOT, PROT, ALBUMIN in the last 168 hours. No results for input(s): LIPASE, AMYLASE in the last 168 hours. No results for input(s): AMMONIA in the last 168 hours. Coagulation Profile: Recent Labs  Lab 12/07/19 0500  INR 1.0   Cardiac Enzymes: No results for input(s): CKTOTAL, CKMB, CKMBINDEX, TROPONINI in the last 168 hours. BNP (last 3 results) No results for input(s): PROBNP in the last 8760 hours. HbA1C: No results for input(s): HGBA1C in the last 72 hours. CBG: Recent Labs  Lab 12/11/19 0359 12/11/19 0437 12/11/19 0530 12/11/19 0753 12/11/19 1228  GLUCAP 78 77 106* 100* 208*   Lipid Profile: No results for input(s): CHOL, HDL, LDLCALC, TRIG, CHOLHDL, LDLDIRECT in the last 72 hours. Thyroid Function Tests: No results for input(s): TSH, T4TOTAL, FREET4, T3FREE, THYROIDAB in the last 72 hours. Anemia Panel: No results for input(s): VITAMINB12, FOLATE, FERRITIN, TIBC, IRON, RETICCTPCT in the last 72  hours. Sepsis Labs: No results for input(s): PROCALCITON, LATICACIDVEN in the last 168 hours.  No results found for this or any previous visit (from the  past 240 hour(s)).   Radiology Studies: No results found.  Scheduled Meds: . amLODipine  10 mg Per Tube Daily  . aspirin  81 mg Per Tube Daily  . benztropine  0.5 mg Per Tube Daily  . budesonide (PULMICORT) nebulizer solution  0.5 mg Nebulization BID  . busPIRone  15 mg Per Tube TID  . chlorhexidine  15 mL Mouth Rinse BID  . Chlorhexidine Gluconate Cloth  6 each Topical Daily  . cloNIDine  0.2 mg Per Tube TID  . dolutegravir  50 mg Oral Daily  . doxepin  50 mg Per Tube QHS  . emtricitabine-tenofovir AF  1 tablet Per Tube Daily  . feeding supplement (PRO-STAT SUGAR FREE 64)  30 mL Per Tube TID  . free water  200 mL Per Tube Q4H  . gabapentin  300 mg Per Tube Q8H  . insulin aspart  0-20 Units Subcutaneous Q4H  . insulin aspart  5 Units Subcutaneous Q4H  . insulin glargine  10 Units Subcutaneous BID  . mouth rinse  15 mL Mouth Rinse q12n4p  . metoprolol tartrate  12.5 mg Per Tube BID  . neomycin-bacitracin-polymyxin  1 application Topical Daily  . pantoprazole sodium  40 mg Per Tube Daily  . polyethylene glycol  17 g Oral Daily  . QUEtiapine  200 mg Per Tube BID  . rosuvastatin  20 mg Per Tube q1800  . sennosides  15 mL Per Tube BID  . sertraline  50 mg Per Tube Daily  . triamterene-hydrochlorothiazide  1 tablet Per Tube Daily   Continuous Infusions: . sodium chloride Stopped (12/07/19 0216)  . feeding supplement (JEVITY 1.2 CAL) 1,000 mL (12/08/19 1740)     LOS: 30 days   Marylu Lund, MD [facility] Pager On Amion  If 7PM-7AM, please contact night-coverage 12/11/2019, 3:56 PM

## 2019-12-12 DIAGNOSIS — I358 Other nonrheumatic aortic valve disorders: Secondary | ICD-10-CM | POA: Diagnosis not present

## 2019-12-12 DIAGNOSIS — G9341 Metabolic encephalopathy: Secondary | ICD-10-CM | POA: Diagnosis not present

## 2019-12-12 DIAGNOSIS — R627 Adult failure to thrive: Secondary | ICD-10-CM | POA: Diagnosis not present

## 2019-12-12 DIAGNOSIS — J9601 Acute respiratory failure with hypoxia: Secondary | ICD-10-CM | POA: Diagnosis not present

## 2019-12-12 LAB — CBC WITH DIFFERENTIAL/PLATELET
Abs Immature Granulocytes: 0.03 10*3/uL (ref 0.00–0.07)
Basophils Absolute: 0.1 10*3/uL (ref 0.0–0.1)
Basophils Relative: 1 %
Eosinophils Absolute: 0.7 10*3/uL — ABNORMAL HIGH (ref 0.0–0.5)
Eosinophils Relative: 6 %
HCT: 38 % (ref 36.0–46.0)
Hemoglobin: 11.9 g/dL — ABNORMAL LOW (ref 12.0–15.0)
Immature Granulocytes: 0 %
Lymphocytes Relative: 26 %
Lymphs Abs: 3 10*3/uL (ref 0.7–4.0)
MCH: 28 pg (ref 26.0–34.0)
MCHC: 31.3 g/dL (ref 30.0–36.0)
MCV: 89.4 fL (ref 80.0–100.0)
Monocytes Absolute: 0.9 10*3/uL (ref 0.1–1.0)
Monocytes Relative: 8 %
Neutro Abs: 6.6 10*3/uL (ref 1.7–7.7)
Neutrophils Relative %: 59 %
Platelets: 335 10*3/uL (ref 150–400)
RBC: 4.25 MIL/uL (ref 3.87–5.11)
RDW: 19.4 % — ABNORMAL HIGH (ref 11.5–15.5)
WBC: 11.2 10*3/uL — ABNORMAL HIGH (ref 4.0–10.5)
nRBC: 0 % (ref 0.0–0.2)

## 2019-12-12 LAB — COMPREHENSIVE METABOLIC PANEL
ALT: 23 U/L (ref 0–44)
AST: 31 U/L (ref 15–41)
Albumin: 2.4 g/dL — ABNORMAL LOW (ref 3.5–5.0)
Alkaline Phosphatase: 125 U/L (ref 38–126)
Anion gap: 10 (ref 5–15)
BUN: 26 mg/dL — ABNORMAL HIGH (ref 6–20)
CO2: 33 mmol/L — ABNORMAL HIGH (ref 22–32)
Calcium: 9.3 mg/dL (ref 8.9–10.3)
Chloride: 93 mmol/L — ABNORMAL LOW (ref 98–111)
Creatinine, Ser: 0.94 mg/dL (ref 0.44–1.00)
GFR calc Af Amer: >60 mL/min (ref >60)
GFR calc non Af Amer: >60 mL/min (ref >60)
Glucose, Bld: 169 mg/dL — ABNORMAL HIGH (ref 70–99)
Potassium: 3.6 mmol/L (ref 3.5–5.1)
Sodium: 136 mmol/L (ref 135–145)
Total Bilirubin: 0.5 mg/dL (ref 0.3–1.2)
Total Protein: 8 g/dL (ref 6.5–8.1)

## 2019-12-12 LAB — GLUCOSE, CAPILLARY
Glucose-Capillary: 105 mg/dL — ABNORMAL HIGH (ref 70–99)
Glucose-Capillary: 151 mg/dL — ABNORMAL HIGH (ref 70–99)
Glucose-Capillary: 160 mg/dL — ABNORMAL HIGH (ref 70–99)
Glucose-Capillary: 168 mg/dL — ABNORMAL HIGH (ref 70–99)
Glucose-Capillary: 168 mg/dL — ABNORMAL HIGH (ref 70–99)
Glucose-Capillary: 196 mg/dL — ABNORMAL HIGH (ref 70–99)
Glucose-Capillary: 202 mg/dL — ABNORMAL HIGH (ref 70–99)
Glucose-Capillary: 213 mg/dL — ABNORMAL HIGH (ref 70–99)

## 2019-12-12 NOTE — Progress Notes (Signed)
PROGRESS NOTE    Susan [name]  [id number] DOB: 04-16-1961 DOA: 11/11/2019 PCP: Hoyt Koch, MD    Brief Narrative:  59 year old female with medical history significant for HIV, diabetes with neuropathy, seizure disorder, tobacco abuse, bipolar disease, schizophrenia, history of IV drug abuse admitted 3/10 to ICU with overdose complicated by aspiration pneumonia with MRSA, pneumococcal bacteremia, toxic metabolic encephalopathy, septic shock and acute versus subacute bilateral MCA infarcts. Patient was intubated on 3/10, difficult to wean off, tracheostomy placed on 3/29.  [facility] hospitalist assumed care on 12/06/2019.  Assessment & Plan:   Principal Problem:   Acute respiratory failure with hypoxemia (HCC) Active Problems:   HIV disease ([place])   Drug-induced hypotension   DNR (do not resuscitate) discussion   AKI (acute kidney injury) ([place])   Pneumococcal bacteremia   Pneumonia   Aortic valve endocarditis   Acute kidney injury (AKI) with acute tubular necrosis (ATN) (HCC)   Acute metabolic encephalopathy   Cerebral embolism with cerebral infarction   Status post tracheostomy [facility])   Palliative care by specialist   Adult failure to thrive   Acute hypoxic/hypercapnic respiratory failure with ARDS from MRSA PNA and aspiration pneumonia Hx of asthma Intubated on 3/10, difficult to wean off, status post trach on 3/29 Currently on trach collar with 8 L of O2 Continue Pulmicort nebulizer, DuoNeb as needed VAP prevention order set Will continue with suctioning and standard trach care as needed  Aerococcus bacteremia 3/25 Klebsiella bronchitis/pneumonia 3/25 Persistent leukocytosis Pt had completed 7-day course cefepime as of 4/1  Acute bilateral large bilateral posterior MCA infarcts - 3/23with severe encephalopathy TEE 3/16 with no sign of endocarditis  MRI brain c/w b/l posterior MCA territory infarcts; MRA/ MRV neg Continue aspirin, Crestor PT/OT consulted  and had been following, dispo is complicated by patient needing restraints for safety  Acute metabolic encephalopathy/hospital delirium Multifactorial, including sepsis, hypoxia, with underlying history of schizophrenia/seizures, anoxic brain injury Persistent agitation as mentioned above Continue current regimen:Cogentin, BuSpar, doxepin, Neurontin, Zoloft, Seroquel Patient continues to try to climb out of bed and pull at trach site, thus being a danger to herself. Will need to continue soft restraints for patient safety per above  Acute systolic heart failure with sepsis induced CM Hx of HTN, HLD Currently on clonidine, amlodipine, maxide, metoprolol ACE inhibitor on hold Continue with Crestor as tolerated  DM type II poorly controlled HbA1C 12.3 from 11/11/19 SSI, lantus, hypoglycemic protocol, continue Accu-Cheks  HIV Continue home Descovy, Tivicay as tolerated  At risk malnutrition S/p PEG placement on 4/5, by IR Continue tube feeds  Obesity Lifestyle modification advised once able to understand  [place] Very poor prognosis due to multiple comorbidities Palliative consulted and had been following   DVT prophylaxis: SCD's Code Status: Full Family Communication: Pt in room, family not at bedside Status is: Inpatient  Remains inpatient appropriate because:Unsafe d/c plan   Dispo: The patient is from: Home              Anticipated d/c is to: SNF              Anticipated d/c date is: > 3 days              Patient currently is not medically stable to d/c.         Consultants:   PCCM  ID  Neurology Procedures:   Mechanical ventilation  TEE   Antimicrobials: Anti-infectives (From admission, onward)   Start     Dose/Rate Route Frequency  Ordered Stop   12/07/19 1603  ceFAZolin (ANCEF) powder       As needed 12/07/19 1603 12/07/19 1603   12/07/19 1426  ceFAZolin (ANCEF) 2-4 GM/100ML-% IVPB    Note to Pharmacy: Rodney Booze   : cabinet  override      12/07/19 1426 12/08/19 0229   12/07/19 0000  ceFAZolin (ANCEF) IVPB 2g/100 mL premix     2 g 200 mL/hr over 30 Minutes Intravenous To Radiology 12/04/19 1142 12/07/19 0108   11/27/19 1145  ceFEPIme (MAXIPIME) 2 g in sodium chloride 0.9 % 100 mL IVPB  Status:  Discontinued     2 g 200 mL/hr over 30 Minutes Intravenous Every 12 hours 11/27/19 1056 12/03/19 0954   11/17/19 1600  penicillin G potassium 9 Million Units in dextrose 5 % 500 mL continuous infusion     9 Million Units 41.7 mL/hr over 12 Hours Intravenous Every 12 hours 11/17/19 1151 11/24/19 2359   11/16/19 1039  Ampicillin-Sulbactam (UNASYN) 3 g in sodium chloride 0.9 % 100 mL IVPB  Status:  Discontinued     3 g 200 mL/hr over 30 Minutes Intravenous Every 6 hours 11/16/19 1009 11/17/19 1151   11/13/19 1600  Ampicillin-Sulbactam (UNASYN) 3 g in sodium chloride 0.9 % 100 mL IVPB  Status:  Discontinued     3 g 200 mL/hr over 30 Minutes Intravenous Every 12 hours 11/13/19 1547 11/16/19 1009   11/13/19 1500  vancomycin (VANCOCIN) IVPB 1000 mg/200 mL premix  Status:  Discontinued     1,000 mg 200 mL/hr over 60 Minutes Intravenous Every 48 hours 11/13/19 0713 11/17/19 1549   11/13/19 1000  dolutegravir (TIVICAY) tablet 50 mg     50 mg Oral Daily 11/12/19 1251     11/13/19 1000  emtricitabine-tenofovir AF (DESCOVY) 200-25 MG per tablet 1 tablet     1 tablet Per Tube Daily 11/12/19 1251     11/12/19 1500  piperacillin-tazobactam (ZOSYN) IVPB 3.375 g  Status:  Discontinued     3.375 g 12.5 mL/hr over 240 Minutes Intravenous Every 8 hours 11/12/19 1359 11/13/19 1547   11/11/19 2200  Ampicillin-Sulbactam (UNASYN) 3 g in sodium chloride 0.9 % 100 mL IVPB  Status:  Discontinued     3 g 200 mL/hr over 30 Minutes Intravenous Every 8 hours 11/11/19 1723 11/12/19 1317   11/11/19 1721  vancomycin variable dose per unstable renal function (pharmacist dosing)  Status:  Discontinued      Does not apply See admin instructions 11/11/19  1723 11/13/19 0713   11/11/19 1700  vancomycin (VANCOCIN) 2,250 mg in sodium chloride 0.9 % 500 mL IVPB     2,250 mg 250 mL/hr over 120 Minutes Intravenous  Once 11/11/19 1648 11/11/19 2005   11/11/19 1645  bictegravir-emtricitabine-tenofovir AF (BIKTARVY) 50-200-25 MG per tablet 1 tablet  Status:  Discontinued     1 tablet Oral Daily 11/11/19 1632 11/11/19 1638   11/11/19 1645  bictegravir-emtricitabine-tenofovir AF (BIKTARVY) 50-200-25 MG per tablet 1 tablet  Status:  Discontinued    Note to Pharmacy: Give via feeding tube if able   1 tablet Oral Daily 11/11/19 1638 11/12/19 1251   11/11/19 1545  vancomycin (VANCOCIN) IVPB 1000 mg/200 mL premix  Status:  Discontinued     1,000 mg 200 mL/hr over 60 Minutes Intravenous  Once 11/11/19 1544 11/11/19 1648   11/11/19 1545  piperacillin-tazobactam (ZOSYN) IVPB 3.375 g     3.375 g 100 mL/hr over 30 Minutes Intravenous  Once 11/11/19 1544 11/11/19  1710      Subjective: Difficullt to assess  Objective: Vitals:   12/12/19 0734 12/12/19 0756 12/12/19 1130 12/12/19 1210  BP: (!) 143/88   133/79  Pulse: 80 77 82 80  Resp: 10  17 (!) 6  Temp: 98.7 F (37.1 C)   (!) 96.7 F (35.9 C)  TempSrc: Oral   Axillary  SpO2: 100%   100%  Weight:      Height:       No intake or output data in the 24 hours ending 12/12/19 1452 Filed Weights   12/06/19 0347 12/07/19 0400 12/11/19 0600  Weight: 100.1 kg 96.9 kg 91.1 kg    Examination: General exam: Asleep, laying in bed, in nad Respiratory system: Normal respiratory effort, no wheezing, trach in place Cardiovascular system: regular rate, s1, s2 Gastrointestinal system: Soft, nondistended, positive BS Central nervous system: CN2-12 grossly intact, strength intact Extremities: Perfused, no clubbing Skin: Normal skin turgor, no notable skin lesions seen Psychiatry: difficult to assess given mentation  Data Reviewed: I have personally reviewed following labs and imaging studies  CBC: Recent  Labs  Lab 12/08/19 0500 12/09/19 0521 12/10/19 0500 12/11/19 0304 12/12/19 0655  WBC 12.7* 9.1 9.5 11.5* 11.2*  NEUTROABS 8.0* 5.0 5.6 6.1 6.6  HGB 10.0* 11.2* 11.1* 12.3 11.9*  HCT 33.0* 36.4 35.9* 39.4 38.0  MCV 89.9 90.8 90.0 90.2 89.4  PLT 384 361 364 380 [id number]   Basic Metabolic Panel: Recent Labs  Lab 12/06/19 0337 12/07/19 0500 12/08/19 0500 12/09/19 0521 12/10/19 0500 12/11/19 0304 12/12/19 0655  NA 140   < > 139 138 138 138 136  K 4.4   < > 3.6 3.8 3.7 3.9 3.6  CL 98   < > 97* 96* 95* 95* 93*  CO2 30   < > 30 33* 34* 31 33*  GLUCOSE 171*   < > 152* 110* 147* 61* 169*  BUN 23*   < > 21* 20 25* 29* 26*  CREATININE 0.87   < > 0.92 0.74 0.86 0.86 0.94  CALCIUM 9.2   < > 9.1 9.1 9.1 9.4 9.3  MG 1.9  --   --   --   --   --   --    < > = values in this interval not displayed.   GFR: Estimated Creatinine Clearance: 70.8 mL/min (by C-G formula based on SCr of 0.94 mg/dL). Liver Function Tests: Recent Labs  Lab 12/12/19 0655  AST 31  ALT 23  ALKPHOS 125  BILITOT 0.5  PROT 8.0  ALBUMIN 2.4*   No results for input(s): LIPASE, AMYLASE in the last 168 hours. No results for input(s): AMMONIA in the last 168 hours. Coagulation Profile: Recent Labs  Lab 12/07/19 0500  INR 1.0   Cardiac Enzymes: No results for input(s): CKTOTAL, CKMB, CKMBINDEX, TROPONINI in the last 168 hours. BNP (last 3 results) No results for input(s): PROBNP in the last 8760 hours. HbA1C: No results for input(s): HGBA1C in the last 72 hours. CBG: Recent Labs  Lab 12/11/19 2047 12/11/19 2355 12/12/19 0320 12/12/19 0734 12/12/19 1212  GLUCAP 133* 105* 168* 151* 168*   Lipid Profile: No results for input(s): CHOL, HDL, LDLCALC, TRIG, CHOLHDL, LDLDIRECT in the last 72 hours. Thyroid Function Tests: No results for input(s): TSH, T4TOTAL, FREET4, T3FREE, THYROIDAB in the last 72 hours. Anemia Panel: No results for input(s): VITAMINB12, FOLATE, FERRITIN, TIBC, IRON, RETICCTPCT in the  last 72 hours. Sepsis Labs: No results for input(s): PROCALCITON, LATICACIDVEN in  the last 168 hours.  No results found for this or any previous visit (from the past 240 hour(s)).   Radiology Studies: No results found.  Scheduled Meds: . amLODipine  10 mg Per Tube Daily  . aspirin  81 mg Per Tube Daily  . benztropine  0.5 mg Per Tube Daily  . budesonide (PULMICORT) nebulizer solution  0.5 mg Nebulization BID  . busPIRone  15 mg Per Tube TID  . chlorhexidine  15 mL Mouth Rinse BID  . Chlorhexidine Gluconate Cloth  6 each Topical Daily  . cloNIDine  0.2 mg Per Tube TID  . dolutegravir  50 mg Oral Daily  . doxepin  50 mg Per Tube QHS  . emtricitabine-tenofovir AF  1 tablet Per Tube Daily  . feeding supplement (PRO-STAT SUGAR FREE 64)  30 mL Per Tube TID  . free water  200 mL Per Tube Q4H  . gabapentin  300 mg Per Tube Q8H  . insulin aspart  0-20 Units Subcutaneous Q4H  . insulin aspart  5 Units Subcutaneous Q4H  . insulin glargine  10 Units Subcutaneous BID  . mouth rinse  15 mL Mouth Rinse q12n4p  . metoprolol tartrate  12.5 mg Per Tube BID  . neomycin-bacitracin-polymyxin  1 application Topical Daily  . pantoprazole sodium  40 mg Per Tube Daily  . polyethylene glycol  17 g Oral Daily  . QUEtiapine  200 mg Per Tube BID  . rosuvastatin  20 mg Per Tube q1800  . sennosides  15 mL Per Tube BID  . sertraline  50 mg Per Tube Daily  . triamterene-hydrochlorothiazide  1 tablet Per Tube Daily   Continuous Infusions: . sodium chloride Stopped (12/07/19 0216)  . feeding supplement (JEVITY 1.2 CAL) 1,000 mL (12/08/19 1740)     LOS: 31 days   Marylu Lund, MD [facility] Pager On Amion  If 7PM-7AM, please contact night-coverage 12/12/2019, 2:52 PM

## 2019-12-12 NOTE — Progress Notes (Signed)
Unable to obtain blood return from PICC for AM labs. Call placed to phlebotomy regarding labs needed. No answer. Second attempt. Will call main lab.

## 2019-12-13 DIAGNOSIS — J9601 Acute respiratory failure with hypoxia: Secondary | ICD-10-CM | POA: Diagnosis not present

## 2019-12-13 DIAGNOSIS — I63413 Cerebral infarction due to embolism of bilateral middle cerebral arteries: Secondary | ICD-10-CM | POA: Diagnosis not present

## 2019-12-13 DIAGNOSIS — G9341 Metabolic encephalopathy: Secondary | ICD-10-CM | POA: Diagnosis not present

## 2019-12-13 LAB — CBC WITH DIFFERENTIAL/PLATELET
Abs Immature Granulocytes: 0.02 10*3/uL (ref 0.00–0.07)
Basophils Absolute: 0.1 10*3/uL (ref 0.0–0.1)
Basophils Relative: 1 %
Eosinophils Absolute: 0.8 10*3/uL — ABNORMAL HIGH (ref 0.0–0.5)
Eosinophils Relative: 8 %
HCT: 37.8 % (ref 36.0–46.0)
Hemoglobin: 11.7 g/dL — ABNORMAL LOW (ref 12.0–15.0)
Immature Granulocytes: 0 %
Lymphocytes Relative: 31 %
Lymphs Abs: 3 10*3/uL (ref 0.7–4.0)
MCH: 28 pg (ref 26.0–34.0)
MCHC: 31 g/dL (ref 30.0–36.0)
MCV: 90.4 fL (ref 80.0–100.0)
Monocytes Absolute: 0.9 10*3/uL (ref 0.1–1.0)
Monocytes Relative: 9 %
Neutro Abs: 4.9 10*3/uL (ref 1.7–7.7)
Neutrophils Relative %: 51 %
Platelets: 347 10*3/uL (ref 150–400)
RBC: 4.18 MIL/uL (ref 3.87–5.11)
RDW: 19.3 % — ABNORMAL HIGH (ref 11.5–15.5)
WBC: 9.7 10*3/uL (ref 4.0–10.5)
nRBC: 0 % (ref 0.0–0.2)

## 2019-12-13 LAB — GLUCOSE, CAPILLARY
Glucose-Capillary: 111 mg/dL — ABNORMAL HIGH (ref 70–99)
Glucose-Capillary: 125 mg/dL — ABNORMAL HIGH (ref 70–99)
Glucose-Capillary: 150 mg/dL — ABNORMAL HIGH (ref 70–99)
Glucose-Capillary: 167 mg/dL — ABNORMAL HIGH (ref 70–99)
Glucose-Capillary: 194 mg/dL — ABNORMAL HIGH (ref 70–99)
Glucose-Capillary: 205 mg/dL — ABNORMAL HIGH (ref 70–99)
Glucose-Capillary: 237 mg/dL — ABNORMAL HIGH (ref 70–99)
Glucose-Capillary: 57 mg/dL — ABNORMAL LOW (ref 70–99)

## 2019-12-13 MED ORDER — DEXTROSE 50 % IV SOLN
INTRAVENOUS | Status: AC
  Administered 2019-12-13: 25 mL via INTRAVENOUS
  Filled 2019-12-13: qty 50

## 2019-12-13 MED ORDER — DEXTROSE 50 % IV SOLN: 25.0000 mL | Freq: Once | INTRAVENOUS | Status: AC

## 2019-12-13 NOTE — Progress Notes (Signed)
PROGRESS NOTE    Susan [name]  [id number] DOB: 28-Jun-1961 DOA: 11/11/2019 PCP: Hoyt Koch, MD    Brief Narrative:  59 year old female with medical history significant for HIV, diabetes with neuropathy, seizure disorder, tobacco abuse, bipolar disease, schizophrenia, history of IV drug abuse admitted 3/10 to ICU with overdose complicated by aspiration pneumonia with MRSA, pneumococcal bacteremia, toxic metabolic encephalopathy, septic shock and acute versus subacute bilateral MCA infarcts. Patient was intubated on 3/10, difficult to wean off, tracheostomy placed on 3/29.  [facility] assumed care on 12/06/2019.  Assessment & Plan:   Principal Problem:   Acute respiratory failure with hypoxemia (HCC) Active Problems:   HIV disease ([place])   Drug-induced hypotension   DNR (do not resuscitate) discussion   AKI (acute kidney injury) ([place])   Pneumococcal bacteremia   Pneumonia   Aortic valve endocarditis   Acute kidney injury (AKI) with acute tubular necrosis (ATN) (HCC)   Acute metabolic encephalopathy   Cerebral embolism with cerebral infarction   Status post tracheostomy [facility])   Palliative care by specialist   Adult failure to thrive   Acute hypoxic/hypercapnic respiratory failure with ARDS from MRSA PNA and aspiration pneumonia Hx of asthma Intubated on 3/10, difficult to wean off, status post trach on 3/29 Currently on trach collar with 5 L of O2 Continue Pulmicort nebulizer, DuoNeb as needed VAP prevention order set Will continue with suctioning and standard trach care as needed  Aerococcus bacteremia 3/25 Klebsiella bronchitis/pneumonia 3/25 Leukocytosis normalized Pt had completed 7-day course cefepime as of 4/1  Acute bilateral large bilateral posterior MCA infarcts - 3/23with severe encephalopathy TEE 3/16 with no sign of endocarditis  MRI brain c/w b/l posterior MCA territory infarcts; MRA/ MRV neg Continue aspirin, Crestor PT/OT consulted  and had been following, dispo remains complicated by patient needing restraints for safety as she tries to climb out of bed  Acute metabolic encephalopathy/hospital delirium Multifactorial, including sepsis, hypoxia, with underlying history of schizophrenia/seizures, anoxic brain injury Persistent agitation as mentioned above Continue current regimen:Cogentin, BuSpar, doxepin, Neurontin, Zoloft, Seroquel Patient continues to try to climb out of bed and pull at trach site, thus being a danger to herself. Will continue soft restraints for patient safety per above  Acute systolic heart failure with sepsis induced CM Hx of HTN, HLD Currently on clonidine, amlodipine, maxide, metoprolol ACE inhibitor on hold Continue with Crestor as pt tolerates  DM type II poorly controlled HbA1C 12.3 from 11/11/19 SSI, lantus, hypoglycemic protocol, continue Accu-Cheks  HIV Continue home Descovy, Tivicay as tolerated  At risk malnutrition S/p PEG placement on 4/5, by IR Continue tube feeds as tolerated  Obesity Lifestyle modification advised once able to participate in conversation  [place] Very poor prognosis due to multiple comorbidities Palliative consulted and had been following   DVT prophylaxis: SCD's Code Status: Full Family Communication: Pt in room, family not at bedside Status is: Inpatient  Remains inpatient appropriate because:Unsafe d/c plan   Dispo: The patient is from: Home              Anticipated d/c is to: SNF              Anticipated d/c date is: > 3 days              Patient currently is not medically stable to d/c.         Consultants:   PCCM  ID  Neurology Procedures:   Mechanical ventilation  TEE   Antimicrobials: Anti-infectives (From admission,  onward)   Start     Dose/Rate Route Frequency Ordered Stop   12/07/19 1603  ceFAZolin (ANCEF) powder       As needed 12/07/19 1603 12/07/19 1603   12/07/19 1426  ceFAZolin (ANCEF) 2-4  GM/100ML-% IVPB    Note to Pharmacy: Rodney Booze   : cabinet override      12/07/19 1426 12/08/19 0229   12/07/19 0000  ceFAZolin (ANCEF) IVPB 2g/100 mL premix     2 g 200 mL/hr over 30 Minutes Intravenous To Radiology 12/04/19 1142 12/07/19 0108   11/27/19 1145  ceFEPIme (MAXIPIME) 2 g in sodium chloride 0.9 % 100 mL IVPB  Status:  Discontinued     2 g 200 mL/hr over 30 Minutes Intravenous Every 12 hours 11/27/19 1056 12/03/19 0954   11/17/19 1600  penicillin G potassium 9 Million Units in dextrose 5 % 500 mL continuous infusion     9 Million Units 41.7 mL/hr over 12 Hours Intravenous Every 12 hours 11/17/19 1151 11/24/19 2359   11/16/19 1039  Ampicillin-Sulbactam (UNASYN) 3 g in sodium chloride 0.9 % 100 mL IVPB  Status:  Discontinued     3 g 200 mL/hr over 30 Minutes Intravenous Every 6 hours 11/16/19 1009 11/17/19 1151   11/13/19 1600  Ampicillin-Sulbactam (UNASYN) 3 g in sodium chloride 0.9 % 100 mL IVPB  Status:  Discontinued     3 g 200 mL/hr over 30 Minutes Intravenous Every 12 hours 11/13/19 1547 11/16/19 1009   11/13/19 1500  vancomycin (VANCOCIN) IVPB 1000 mg/200 mL premix  Status:  Discontinued     1,000 mg 200 mL/hr over 60 Minutes Intravenous Every 48 hours 11/13/19 0713 11/17/19 1549   11/13/19 1000  dolutegravir (TIVICAY) tablet 50 mg     50 mg Oral Daily 11/12/19 1251     11/13/19 1000  emtricitabine-tenofovir AF (DESCOVY) 200-25 MG per tablet 1 tablet     1 tablet Per Tube Daily 11/12/19 1251     11/12/19 1500  piperacillin-tazobactam (ZOSYN) IVPB 3.375 g  Status:  Discontinued     3.375 g 12.5 mL/hr over 240 Minutes Intravenous Every 8 hours 11/12/19 1359 11/13/19 1547   11/11/19 2200  Ampicillin-Sulbactam (UNASYN) 3 g in sodium chloride 0.9 % 100 mL IVPB  Status:  Discontinued     3 g 200 mL/hr over 30 Minutes Intravenous Every 8 hours 11/11/19 1723 11/12/19 1317   11/11/19 1721  vancomycin variable dose per unstable renal function (pharmacist dosing)  Status:   Discontinued      Does not apply See admin instructions 11/11/19 1723 11/13/19 0713   11/11/19 1700  vancomycin (VANCOCIN) 2,250 mg in sodium chloride 0.9 % 500 mL IVPB     2,250 mg 250 mL/hr over 120 Minutes Intravenous  Once 11/11/19 1648 11/11/19 2005   11/11/19 1645  bictegravir-emtricitabine-tenofovir AF (BIKTARVY) 50-200-25 MG per tablet 1 tablet  Status:  Discontinued     1 tablet Oral Daily 11/11/19 1632 11/11/19 1638   11/11/19 1645  bictegravir-emtricitabine-tenofovir AF (BIKTARVY) 50-200-25 MG per tablet 1 tablet  Status:  Discontinued    Note to Pharmacy: Give via feeding tube if able   1 tablet Oral Daily 11/11/19 1638 11/12/19 1251   11/11/19 1545  vancomycin (VANCOCIN) IVPB 1000 mg/200 mL premix  Status:  Discontinued     1,000 mg 200 mL/hr over 60 Minutes Intravenous  Once 11/11/19 1544 11/11/19 1648   11/11/19 1545  piperacillin-tazobactam (ZOSYN) IVPB 3.375 g     3.375 g  100 mL/hr over 30 Minutes Intravenous  Once 11/11/19 1544 11/11/19 1710      Subjective: Awake, pleasant and asks "How are you?" but does not seem to answer questions appropriately  Objective: Vitals:   12/13/19 0833 12/13/19 1150 12/13/19 1212 12/13/19 1455  BP: 115/66 115/66 110/60 103/68  Pulse: 78 75 75 79  Resp: 20 12 13 16   Temp:   97.6 F (36.4 C)   TempSrc:   Axillary   SpO2: 100% 97% 100% 98%  Weight:      Height:        Intake/Output Summary (Last 24 hours) at 12/13/2019 1630 Last data filed at 12/13/2019 0600 Gross per 24 hour  Intake 150 ml  Output 500 ml  Net -350 ml   Filed Weights   12/07/19 0400 12/11/19 0600 12/13/19 0338  Weight: 96.9 kg 91.1 kg 92 kg    Examination: General exam: Conversant, in no acute distress Respiratory system: normal chest rise, clear, no audible wheezing Cardiovascular system: regular rhythm, s1-s2 Gastrointestinal system: Nondistended, nontender, pos BS Central nervous system: No seizures, no tremors Extremities: No cyanosis, no joint  deformities, restraints in place Skin: No rashes, no pallor Psychiatry: affect appears bright, unknown if pt has hallucinations   Data Reviewed: I have personally reviewed following labs and imaging studies  CBC: Recent Labs  Lab 12/09/19 0521 12/10/19 0500 12/11/19 0304 12/12/19 0655 12/13/19 0500  WBC 9.1 9.5 11.5* 11.2* 9.7  NEUTROABS 5.0 5.6 6.1 6.6 4.9  HGB 11.2* 11.1* 12.3 11.9* 11.7*  HCT 36.4 35.9* 39.4 38.0 37.8  MCV 90.8 90.0 90.2 89.4 90.4  PLT 361 364 380 335 [id number]   Basic Metabolic Panel: Recent Labs  Lab 12/08/19 0500 12/09/19 0521 12/10/19 0500 12/11/19 0304 12/12/19 0655  NA 139 138 138 138 136  K 3.6 3.8 3.7 3.9 3.6  CL 97* 96* 95* 95* 93*  CO2 30 33* 34* 31 33*  GLUCOSE 152* 110* 147* 61* 169*  BUN 21* 20 25* 29* 26*  CREATININE 0.92 0.74 0.86 0.86 0.94  CALCIUM 9.1 9.1 9.1 9.4 9.3   GFR: Estimated Creatinine Clearance: 71.1 mL/min (by C-G formula based on SCr of 0.94 mg/dL). Liver Function Tests: Recent Labs  Lab 12/12/19 0655  AST 31  ALT 23  ALKPHOS 125  BILITOT 0.5  PROT 8.0  ALBUMIN 2.4*   No results for input(s): LIPASE, AMYLASE in the last 168 hours. No results for input(s): AMMONIA in the last 168 hours. Coagulation Profile: Recent Labs  Lab 12/07/19 0500  INR 1.0   Cardiac Enzymes: No results for input(s): CKTOTAL, CKMB, CKMBINDEX, TROPONINI in the last 168 hours. BNP (last 3 results) No results for input(s): PROBNP in the last 8760 hours. HbA1C: No results for input(s): HGBA1C in the last 72 hours. CBG: Recent Labs  Lab 12/13/19 0335 12/13/19 0426 12/13/19 0811 12/13/19 1014 12/13/19 1159  GLUCAP 57* 111* 167* 194* 237*   Lipid Profile: No results for input(s): CHOL, HDL, LDLCALC, TRIG, CHOLHDL, LDLDIRECT in the last 72 hours. Thyroid Function Tests: No results for input(s): TSH, T4TOTAL, FREET4, T3FREE, THYROIDAB in the last 72 hours. Anemia Panel: No results for input(s): VITAMINB12, FOLATE, FERRITIN, TIBC,  IRON, RETICCTPCT in the last 72 hours. Sepsis Labs: No results for input(s): PROCALCITON, LATICACIDVEN in the last 168 hours.  No results found for this or any previous visit (from the past 240 hour(s)).   Radiology Studies: No results found.  Scheduled Meds: . amLODipine  10 mg Per Tube Daily  .  aspirin  81 mg Per Tube Daily  . benztropine  0.5 mg Per Tube Daily  . budesonide (PULMICORT) nebulizer solution  0.5 mg Nebulization BID  . busPIRone  15 mg Per Tube TID  . chlorhexidine  15 mL Mouth Rinse BID  . Chlorhexidine Gluconate Cloth  6 each Topical Daily  . cloNIDine  0.2 mg Per Tube TID  . dolutegravir  50 mg Oral Daily  . doxepin  50 mg Per Tube QHS  . emtricitabine-tenofovir AF  1 tablet Per Tube Daily  . feeding supplement (PRO-STAT SUGAR FREE 64)  30 mL Per Tube TID  . free water  200 mL Per Tube Q4H  . gabapentin  300 mg Per Tube Q8H  . insulin aspart  0-20 Units Subcutaneous Q4H  . insulin aspart  5 Units Subcutaneous Q4H  . insulin glargine  10 Units Subcutaneous BID  . mouth rinse  15 mL Mouth Rinse q12n4p  . metoprolol tartrate  12.5 mg Per Tube BID  . neomycin-bacitracin-polymyxin  1 application Topical Daily  . pantoprazole sodium  40 mg Per Tube Daily  . polyethylene glycol  17 g Oral Daily  . QUEtiapine  200 mg Per Tube BID  . rosuvastatin  20 mg Per Tube q1800  . sennosides  15 mL Per Tube BID  . sertraline  50 mg Per Tube Daily  . triamterene-hydrochlorothiazide  1 tablet Per Tube Daily   Continuous Infusions: . sodium chloride Stopped (12/07/19 0216)  . feeding supplement (JEVITY 1.2 CAL) 50 mL/hr at 12/12/19 1844     LOS: 32 days   Marylu Lund, MD [facility] Pager On Amion  If 7PM-7AM, please contact night-coverage 12/13/2019, 4:30 PM

## 2019-12-14 DIAGNOSIS — G9341 Metabolic encephalopathy: Secondary | ICD-10-CM | POA: Diagnosis not present

## 2019-12-14 DIAGNOSIS — J9601 Acute respiratory failure with hypoxia: Secondary | ICD-10-CM | POA: Diagnosis not present

## 2019-12-14 DIAGNOSIS — J690 Pneumonitis due to inhalation of food and vomit: Secondary | ICD-10-CM | POA: Diagnosis not present

## 2019-12-14 LAB — COMPREHENSIVE METABOLIC PANEL
ALT: 23 U/L (ref 0–44)
AST: 24 U/L (ref 15–41)
Albumin: 2.5 g/dL — ABNORMAL LOW (ref 3.5–5.0)
Alkaline Phosphatase: 107 U/L (ref 38–126)
Anion gap: 14 (ref 5–15)
BUN: 39 mg/dL — ABNORMAL HIGH (ref 6–20)
CO2: 31 mmol/L (ref 22–32)
Calcium: 9.4 mg/dL (ref 8.9–10.3)
Chloride: 91 mmol/L — ABNORMAL LOW (ref 98–111)
Creatinine, Ser: 1.04 mg/dL — ABNORMAL HIGH (ref 0.44–1.00)
GFR calc Af Amer: >60 mL/min (ref >60)
GFR calc non Af Amer: 60 mL/min — ABNORMAL LOW (ref >60)
Glucose, Bld: 112 mg/dL — ABNORMAL HIGH (ref 70–99)
Potassium: 4.1 mmol/L (ref 3.5–5.1)
Sodium: 136 mmol/L (ref 135–145)
Total Bilirubin: 0.2 mg/dL — ABNORMAL LOW (ref 0.3–1.2)
Total Protein: 7.6 g/dL (ref 6.5–8.1)

## 2019-12-14 LAB — CBC WITH DIFFERENTIAL/PLATELET
Abs Immature Granulocytes: 0.04 10*3/uL (ref 0.00–0.07)
Basophils Absolute: 0.1 10*3/uL (ref 0.0–0.1)
Basophils Relative: 1 %
Eosinophils Absolute: 0.8 10*3/uL — ABNORMAL HIGH (ref 0.0–0.5)
Eosinophils Relative: 8 %
HCT: 37.7 % (ref 36.0–46.0)
Hemoglobin: 11.6 g/dL — ABNORMAL LOW (ref 12.0–15.0)
Immature Granulocytes: 0 %
Lymphocytes Relative: 32 %
Lymphs Abs: 3.2 10*3/uL (ref 0.7–4.0)
MCH: 28.1 pg (ref 26.0–34.0)
MCHC: 30.8 g/dL (ref 30.0–36.0)
MCV: 91.3 fL (ref 80.0–100.0)
Monocytes Absolute: 0.8 10*3/uL (ref 0.1–1.0)
Monocytes Relative: 8 %
Neutro Abs: 5.2 10*3/uL (ref 1.7–7.7)
Neutrophils Relative %: 51 %
Platelets: 311 10*3/uL (ref 150–400)
RBC: 4.13 MIL/uL (ref 3.87–5.11)
RDW: 19 % — ABNORMAL HIGH (ref 11.5–15.5)
WBC: 10.1 10*3/uL (ref 4.0–10.5)
nRBC: 0 % (ref 0.0–0.2)

## 2019-12-14 LAB — GLUCOSE, CAPILLARY
Glucose-Capillary: 132 mg/dL — ABNORMAL HIGH (ref 70–99)
Glucose-Capillary: 76 mg/dL (ref 70–99)
Glucose-Capillary: 156 mg/dL — ABNORMAL HIGH (ref 70–99)
Glucose-Capillary: 155 mg/dL — ABNORMAL HIGH (ref 70–99)
Glucose-Capillary: 70 mg/dL (ref 70–99)
Glucose-Capillary: 152 mg/dL — ABNORMAL HIGH (ref 70–99)

## 2019-12-14 NOTE — TOC Progression Note (Addendum)
Transition of Care [facility]) - Progression Note    Patient Details  Name: Susan [name] MRN: [id number] Date of Birth: 1962/03/23to update on   Transition of Care [name] CM/SW [place], RN Phone Number: 12/14/2019, 1:55 PM  Clinical Narrative:    Bed offer pending from [facility]. Md states that patient is not safe to discontinue restraints at this time due to attempting to pull trach. CM attempted to call daughter Thayer Headings to update her on the plan of care. Daughters husband answered the phone stating that his wife had steeped out for a minute. CM left message to have daughter call CM when she returned. Will continue to follow for safe discharge disposition.   12/28/2019 Patient continues to not be a candidate for SNF transfer due to need for restraints. Patient is unsafe to trial out of restraints. CM will continue to follow.   Expected Discharge Plan: Skilled Nursing Facility Barriers to Discharge: Continued Medical Work up  Expected Discharge Plan and Services Expected Discharge Plan: [place]                                               Social Determinants of Health (SDOH) Interventions    Readmission Risk Interventions Readmission Risk Prevention Plan 02/19/2019  Transportation Screening Complete  Medication Review Press photographer) Complete  PCP or Specialist appointment within 3-5 days of discharge Complete  HRI or [place] Complete  SW Recovery Care/Counseling Consult Complete  [place] Not Applicable  Some recent data might be hidden

## 2019-12-14 NOTE — Progress Notes (Signed)
PROGRESS NOTE    Susan [name]  [id number] DOB: [birth year] DOA: 11/11/2019 PCP: Hoyt Koch, MD    Brief Narrative:  59 year old female with medical history significant for HIV, diabetes with neuropathy, seizure disorder, tobacco abuse, bipolar disease, schizophrenia, history of IV drug abuse admitted 3/10 to ICU with overdose complicated by aspiration pneumonia with MRSA, pneumococcal bacteremia, toxic metabolic encephalopathy, septic shock and acute versus subacute bilateral MCA infarcts. Patient was intubated on 3/10, difficult to wean off, tracheostomy placed on 3/29.  [facility] assumed care on 12/06/2019.  Assessment & Plan:   Principal Problem:   Acute respiratory failure with hypoxemia (HCC) Active Problems:   HIV disease ([place])   Drug-induced hypotension   DNR (do not resuscitate) discussion   AKI (acute kidney injury) ([place])   Pneumococcal bacteremia   Pneumonia   Aortic valve endocarditis   Acute kidney injury (AKI) with acute tubular necrosis (ATN) (HCC)   Acute metabolic encephalopathy   Cerebral embolism with cerebral infarction   Status post tracheostomy [facility])   Palliative care by specialist   Adult failure to thrive   Acute hypoxic/hypercapnic respiratory failure with ARDS from MRSA PNA and aspiration pneumonia Hx of asthma Intubated on 3/10, difficult to wean off, status post trach on 3/29 Currently on trach collar with 5 L of O2 Continue Pulmicort nebulizer, DuoNeb as needed Continue with VAP prevention order set PCCM following for trach care. Changed to cuffless trach 4/12  Aerococcus bacteremia 3/25 Klebsiella bronchitis/pneumonia 3/25 Leukocytosis normalized Pt had completed 7-day course cefepime as of 4/1  Acute bilateral large bilateral posterior MCA infarcts - 3/23with severe encephalopathy TEE 3/16 with no sign of endocarditis  MRI brain c/w b/l posterior MCA territory infarcts; MRA/ MRV neg Continue aspirin, Crestor PT/OT  consulted and had been following, dispo continues to be complicated by patient needing restraints for safety as she tries to climb out of bed  Acute metabolic encephalopathy/hospital delirium Multifactorial, including sepsis, hypoxia, with underlying history of schizophrenia/seizures, anoxic brain injury Persistent agitation as mentioned above Continue current regimen:Cogentin, BuSpar, doxepin, Neurontin, Zoloft, Seroquel Patient continues to try to climb out of bed and pull at trach site, thus being a danger to herself. Continue with soft restraints for patient safety per above  Acute systolic heart failure with sepsis induced CM Hx of HTN, HLD Currently on clonidine, amlodipine, maxide, metoprolol ACE inhibitor on hold Continue with Crestor as pt tolerates  DM type II poorly controlled HbA1C 12.3 from 11/11/19 SSI, lantus, hypoglycemic protocol, continue Accu-Cheks  HIV Continue home Descovy, Tiv[place]ay as tolerated  At risk malnutrition S/p PEG placement on 4/5, by IR Continue with tube feeds as tolerated  Obesity Lifestyle modification advised once able to participate in conversation  [place] Very poor prognosis due to multiple comorbidities Palliative consulted and had been following   DVT prophylaxis: SCD's Code Status: Full Family Communication: Pt in room, family not at bedside Status is: Inpatient  Remains inpatient appropriate because:Unsafe d/c plan   Dispo: The patient is from: Home              Anticipated d/c is to: SNF              Anticipated d/c date is: > 3 days              Patient currently is not medically stable to d/c.         Consultants:   PCCM  ID  Neurology Procedures:   Mechanical ventilation  TEE  Trach changed to cuffless 4/12   Antimicrobials: Anti-infectives (From admission, onward)   Start     Dose/Rate Route Frequency Ordered Stop   12/07/19 1603  ceFAZolin (ANCEF) powder       As needed 12/07/19 1603  12/07/19 1603   12/07/19 1426  ceFAZolin (ANCEF) 2-4 GM/100ML-% IVPB    Note to Pharmacy: Rodney Booze   : cabinet override      12/07/19 1426 12/08/19 0229   12/07/19 0000  ceFAZolin (ANCEF) IVPB 2g/100 mL premix     2 g 200 mL/hr over 30 Minutes Intravenous To Radiology 12/04/19 1142 12/07/19 0108   11/27/19 1145  ceFEPIme (MAXIPIME) 2 g in sodium chloride 0.9 % 100 mL IVPB  Status:  Discontinued     2 g 200 mL/hr over 30 Minutes Intravenous Every 12 hours 11/27/19 1056 12/03/19 0954   11/17/19 1600  penicillin G potassium 9 Million Units in dextrose 5 % 500 mL continuous infusion     9 Million Units 41.7 mL/hr over 12 Hours Intravenous Every 12 hours 11/17/19 1151 11/24/19 2359   11/16/19 1039  Ampicillin-Sulbactam (UNASYN) 3 g in sodium chloride 0.9 % 100 mL IVPB  Status:  Discontinued     3 g 200 mL/hr over 30 Minutes Intravenous Every 6 hours 11/16/19 1009 11/17/19 1151   11/13/19 1600  Ampicillin-Sulbactam (UNASYN) 3 g in sodium chloride 0.9 % 100 mL IVPB  Status:  Discontinued     3 g 200 mL/hr over 30 Minutes Intravenous Every 12 hours 11/13/19 1547 11/16/19 1009   11/13/19 1500  vancomycin (VANCOCIN) IVPB 1000 mg/200 mL premix  Status:  Discontinued     1,000 mg 200 mL/hr over 60 Minutes Intravenous Every 48 hours 11/13/19 0713 11/17/19 1549   11/13/19 1000  dolutegravir (TIVICAY) tablet 50 mg     50 mg Oral Daily 11/12/19 1251     11/13/19 1000  emtricitabine-tenofovir AF (DESCOVY) 200-25 MG per tablet 1 tablet     1 tablet Per Tube Daily 11/12/19 1251     11/12/19 1500  piperacillin-tazobactam (ZOSYN) IVPB 3.375 g  Status:  Discontinued     3.375 g 12.5 mL/hr over 240 Minutes Intravenous Every 8 hours 11/12/19 1359 11/13/19 1547   11/11/19 2200  Ampicillin-Sulbactam (UNASYN) 3 g in sodium chloride 0.9 % 100 mL IVPB  Status:  Discontinued     3 g 200 mL/hr over 30 Minutes Intravenous Every 8 hours 11/11/19 1723 11/12/19 1317   11/11/19 1721  vancomycin variable dose per  unstable renal function (pharmacist dosing)  Status:  Discontinued      Does not apply See admin instructions 11/11/19 1723 11/13/19 0713   11/11/19 1700  vancomycin (VANCOCIN) 2,250 mg in sodium chloride 0.9 % 500 mL IVPB     2,250 mg 250 mL/hr over 120 Minutes Intravenous  Once 11/11/19 1648 11/11/19 2005   11/11/19 1645  bictegravir-emtricitabine-tenofovir AF (BIKTARVY) 50-200-25 MG per tablet 1 tablet  Status:  Discontinued     1 tablet Oral Daily 11/11/19 1632 11/11/19 1638   11/11/19 1645  bictegravir-emtricitabine-tenofovir AF (BIKTARVY) 50-200-25 MG per tablet 1 tablet  Status:  Discontinued    Note to Pharmacy: Give via feeding tube if able   1 tablet Oral Daily 11/11/19 1638 11/12/19 1251   11/11/19 1545  vancomycin (VANCOCIN) IVPB 1000 mg/200 mL premix  Status:  Discontinued     1,000 mg 200 mL/hr over 60 Minutes Intravenous  Once 11/11/19 1544 11/11/19 1648   11/11/19 1545  piperacillin-tazobactam (ZOSYN) IVPB 3.375 g     3.375 g 100 mL/hr over 30 Minutes Intravenous  Once 11/11/19 1544 11/11/19 1710      Subjective: Pleasant, waving interviewer into room this AM, not very conversant  Objective: Vitals:   12/14/19 1300 12/14/19 1455 12/14/19 1544 12/14/19 1612  BP: 123/60 123/60 124/65 137/86  Pulse: 81 80 80 76  Resp: 18 13 13 18   Temp: 98.4 F (36.9 C)  99.2 F (37.3 C) 98.3 F (36.8 C)  TempSrc: Oral  Axillary Oral  SpO2: 100% 100%  100%  Weight:      Height:        Intake/Output Summary (Last 24 hours) at 12/14/2019 1703 Last data filed at 12/14/2019 1630 Gross per 24 hour  Intake 1180 ml  Output 0 ml  Net 1180 ml   Filed Weights   12/11/19 0600 12/13/19 0338 12/14/19 0500  Weight: 91.1 kg 92 kg 92.9 kg    Examination: General exam: Awake, laying in bed, in nad Respiratory system: Normal respiratory effort, no audible wheezing Cardiovascular system: regular rate, s1, s2 Gastrointestinal system: Soft, nondistended, positive BS Central nervous  system: CN2-12 grossly intact, strength intact Extremities: Perfused, no clubbing Skin: Normal skin turgor, no notable skin lesions seen Psychiatry: Unable to assess given current mentation  Data Reviewed: I have personally reviewed following labs and imaging studies  CBC: Recent Labs  Lab 12/10/19 0500 12/11/19 0304 12/12/19 0655 12/13/19 0500 12/14/19 0603  WBC 9.5 11.5* 11.2* 9.7 10.1  NEUTROABS 5.6 6.1 6.6 4.9 5.2  HGB 11.1* 12.3 11.9* 11.7* 11.6*  HCT 35.9* 39.4 38.0 37.8 37.7  MCV 90.0 90.2 89.4 90.4 91.3  PLT 364 380 335 347 [id number]   Basic Metabolic Panel: Recent Labs  Lab 12/09/19 0521 12/10/19 0500 12/11/19 0304 12/12/19 0655 12/14/19 0603  NA 138 138 138 136 136  K 3.8 3.7 3.9 3.6 4.1  CL 96* 95* 95* 93* 91*  CO2 33* 34* 31 33* 31  GLUCOSE 110* 147* 61* 169* 112*  BUN 20 25* 29* 26* 39*  CREATININE 0.74 0.86 0.86 0.94 1.04*  CALCIUM 9.1 9.1 9.4 9.3 9.4   GFR: Estimated Creatinine Clearance: 64.6 mL/min (A) (by C-G formula based on SCr of 1.04 mg/dL (H)). Liver Function Tests: Recent Labs  Lab 12/12/19 0655 12/14/19 0603  AST 31 24  ALT 23 23  ALKPHOS 125 107  BILITOT 0.5 0.2*  PROT 8.0 7.6  ALBUMIN 2.4* 2.5*   No results for input(s): LIPASE, AMYLASE in the last 168 hours. No results for input(s): AMMONIA in the last 168 hours. Coagulation Profile: No results for input(s): INR, PROTIME in the last 168 hours. Cardiac Enzymes: No results for input(s): CKTOTAL, CKMB, CKMBINDEX, TROPONINI in the last 168 hours. BNP (last 3 results) No results for input(s): PROBNP in the last 8760 hours. HbA1C: No results for input(s): HGBA1C in the last 72 hours. CBG: Recent Labs  Lab 12/13/19 2342 12/14/19 0428 12/14/19 0850 12/14/19 1248 12/14/19 1544  GLUCAP 125* 132* 76 156* 155*   Lipid Profile: No results for input(s): CHOL, HDL, LDLCALC, TRIG, CHOLHDL, LDLDIRECT in the last 72 hours. Thyroid Function Tests: No results for input(s): TSH, T4TOTAL,  FREET4, T3FREE, THYROIDAB in the last 72 hours. Anemia Panel: No results for input(s): VITAMINB12, FOLATE, FERRITIN, TIBC, IRON, RETICCTPCT in the last 72 hours. Sepsis Labs: No results for input(s): PROCALCITON, LATICACIDVEN in the last 168 hours.  No results found for this or any previous visit (from the past  240 hour(s)).   Radiology Studies: No results found.  Scheduled Meds: . amLODipine  10 mg Per Tube Daily  . aspirin  81 mg Per Tube Daily  . benztropine  0.5 mg Per Tube Daily  . budesonide (PULMICORT) nebulizer solution  0.5 mg Nebulization BID  . busPIRone  15 mg Per Tube TID  . chlorhexidine  15 mL Mouth Rinse BID  . Chlorhexidine Gluconate Cloth  6 each Topical Daily  . cloNIDine  0.2 mg Per Tube TID  . dolutegravir  50 mg Oral Daily  . doxepin  50 mg Per Tube QHS  . emtricitabine-tenofovir AF  1 tablet Per Tube Daily  . feeding supplement (PRO-STAT SUGAR FREE 64)  30 mL Per Tube TID  . free water  200 mL Per Tube Q4H  . gabapentin  300 mg Per Tube Q8H  . insulin aspart  0-20 Units Subcutaneous Q4H  . insulin aspart  5 Units Subcutaneous Q4H  . insulin glargine  10 Units Subcutaneous BID  . mouth rinse  15 mL Mouth Rinse q12n4p  . metoprolol tartrate  12.5 mg Per Tube BID  . pantoprazole sodium  40 mg Per Tube Daily  . polyethylene glycol  17 g Oral Daily  . QUEtiapine  200 mg Per Tube BID  . rosuvastatin  20 mg Per Tube q1800  . sennosides  15 mL Per Tube BID  . sertraline  50 mg Per Tube Daily  . triamterene-hydrochlorothiazide  1 tablet Per Tube Daily   Continuous Infusions: . sodium chloride Stopped (12/07/19 0216)  . feeding supplement (JEVITY 1.2 CAL) 50 mL/hr at 12/14/19 0326     LOS: 33 days   Marylu Lund, MD [facility] Pager On Amion  If 7PM-7AM, please contact night-coverage 12/14/2019, 5:03 PM

## 2019-12-14 NOTE — Procedures (Signed)
Tracheostomy Change Note  Patient Details:   Name: NYEESHA BENDICK DOB: 1961-03-17 MRN: [id number]    Airway Documentation:     Evaluation  O2 sats: stable throughout Complications: No apparent complications Patient did tolerate procedure well. Bilateral Breath Sounds: Diminished    Lurline Idol was changed from a #6 cuffed to a #6 cuffless by RT & CCM NP & PA without any complications. Positive ETCO2 after trach change. Lurline Idol was secured with trach ties.   Claretta Fraise 12/14/2019, 2:55 PM

## 2019-12-14 NOTE — Progress Notes (Signed)
NAME:  Susan [name], MRN:  [id number], DOB:  [birth year], LOS: 74 ADMISSION DATE:  11/11/2019, CONSULTATION DATE:  3/10 REFERRING MD:  Sabra Heck, CHIEF COMPLAINT:  Acute encephalopathy and respiratory failure    Brief History   59 year old female smoker admitted 3/10 with overdose complicated by aspiration PNA with MRSA,  Pneumococcal bacteremia, toxic metabolic encephalopathy and septic shock.    Past Medical History  Frequent falls at home,Tobacco abuse, diabetes with painful polyneuropathy, bipolar disease, GERD, HIV, schizophrenia, history of IV drug abuse, seizure disorder.   Significant Hospital Events   3/10 Admit with overdose, intubated on arrival, in shock   3/15 Ventilator asynchrony, heavy sedation & intermittent paralysis   3/16 Lasix drip.  Awaiting TEE.  Sputum w/ MRSA.   Coming back down on sedation, changed to [facility]. TEE showing NO EVIDENCE of endocarditis. ABX changed to pen G per ID. MRSA therapy complete. EF improved. Now up to 50% from 20-25% 3/17 Heavily sedated. Having episodes of bradycardia. Still massively overloaded (>14L), increasing lasix gtt to 8 mg from 4, adding metolazone.  3/19 Lasix to 40 every 12, changing RASS goal to -2, decrease in fentanyl, decreasing PEEP, anticipating trialing pressure support ventilation.  Watching upward trend of white blood cell count and low-grade fever 3/20 BM early am. Agitation early am > sedate post am PT psy meds 3/22 Decreased mentation and movement of extremities overnight, sent for Head CT which revealed acute vs subacute bilateral MCA infarcts  3/23 Lying in bed seen spontaneously moving lower and right upper extremity, unable to follow any commands.  3/24 Hypoglycemic episodes overnight, Weaning well 10/5 today, tmax 101.5 Remains on precedex gtt 1.2 3/29 tracheostomy placed  Consults:  ID Cards for TEE Neurology  Palliative care  Procedures:  ETT 3/10 >>  R Fem TLC (ER) 3/10  R IJ TLC 3/10  L IJ TLC 3/10 >> 3/17    RUE PICC 3/16 >>  Trach 3/29 >>  PEG 4/05 >>   Significant Diagnostic Tests:   CT brain 3/10 >> Normal head CT  EEG 3/10 >> Moderate to severe diffuse encephalopathy. No seizures  Urine drug screen 3/10 >> positive for THC, acetaminophen Q000111Q, salicylate <7, ETOH Q000111Q  ECHO 3/11 >> EF 20-25%, LV function severely diminished, Global Hypokinesis,LV hypertrophy, Grade 1 Diastolic Dysfunction, Elevated PASP, Dilated LA, MV regurgitation, small mobile density right coronary cusp  TEE 3/16 >> LVEF 55%, No LA/LAA thrombus or masses, Previously noted mass on the aortic valve is a node of Arancius.  Normal variant and no evidence of endocarditis.Trivial TR and trivial PR 3/23 MRI brain >>Acute nonhemorrhagic infarcts in the posterior MCA territory bilaterally. Otherwise no evidence of chronic ischemia. 3/23 MRA head >>No significant intracranial stenosis.  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 3/23 MRV head >> negative  Micro Data:  Influenza A/B 3/10 >> negative  COVID 3/10 >> negative  MRSA PCR 3/10 >> positive  BCx2 3/10 >> streptococcus pneumoniae >> S-rocephin BCx1 (aerobic only) 3/10 >> negative  Tracheal aspirate 3/11 >> MRSA BCx2 3/12 >> negative  BCx2 3/25 >> aerococcus  Resp cx 3/25 >> Klebsiella (R amp)   Antimicrobials:  Vancomycin 3/10 >> 3/16 Unasyn 3/10 >> 3/16 Pen G 3/16 >> 3/23 Cefepime 3/26 >> 3/31  Interim history/subjective:  Remains on TC 28%/ 5L  Remains NPO per SLP recs Reports of some thick mucous plugs on 4/9 Currently doing well.  Patient phonates around her trach asking "can I go home today"  Objective   BP 119/76 (BP Location: Left Arm)   Pulse 78   Temp (!) 97.5 F (36.4 C) (Oral)   Resp 14   Ht 5\' 3"  (1.6 m)   Wt 92.9 kg   LMP  (LMP Unknown)   SpO2 100%   BMI 36.28 kg/m   I/O last 3 completed shifts: In: 676.7 [I.V.:10; Other:150; NG/GT:516.7] Out: 1000 [Urine:1000]  Examination:  General:  Adult female  lying in bed in NAD, very happy and smiling, remains in 4 pt restraints HEENT: MM pink/moist, midline cuffed shiley 6 (cuff down), patient able to phonate around trach at times, site cdi Neuro: Awake, MAE, follows intermittent commands CV: rr, no murmur PULM:  Non labored, clear throughout  GI: soft, bs active, PEG site wnl, purwick  Extremities: warm/dry, no LE edema  Skin: no rashes   Resolved Hospital Problem list   Rhabdomyolysis, Elevated INR, Septic shock, MRSA and aspiration pneumonia, and Pneumococcal bacteremia, Aerococcus bacteremia, Klebsiella HCAP  Assessment & Plan:   Acute hypoxic/hypercapnic respiratory failure with ARDS from MRSA PNA and aspiration pneumonia. Tracheostomy status. Hx of asthma. - trach care, will change to 6 cuffless today.  Defer downsizing due to mucous plugging several days ago - continue with SLP, remains NPO,  - goal SpO2 > 92% - continue pulmicort, prn duonebs, aggressive pulmonary hygiene  Acute bilateral large bilateral posterior MCA infarcts. - PT/OT/Speech therapy  Hx of HIV Acute systolic heart failure with sepsis induced CM. Hx of HTN, HLD. Hx of schizophrenia, seizures. DM type II poorly controlled with hyperglycemia. - per primary team  PCCM will continue to follow weekly for trach care. Please call sooner if needed.   Labs:   CMP Latest Ref Rng & Units 12/14/2019 12/12/2019 12/11/2019  Glucose 70 - 99 mg/dL 112(H) 169(H) 61(L)  BUN 6 - 20 mg/dL 39(H) 26(H) 29(H)  Creatinine 0.44 - 1.00 mg/dL 1.04(H) 0.94 0.86  Sodium 135 - 145 mmol/L 136 136 138  Potassium 3.5 - 5.1 mmol/L 4.1 3.6 3.9  Chloride 98 - 111 mmol/L 91(L) 93(L) 95(L)  CO2 22 - 32 mmol/L 31 33(H) 31  Calcium 8.9 - 10.3 mg/dL 9.4 9.3 9.4  Total Protein 6.5 - 8.1 g/dL 7.6 8.0 -  Total Bilirubin 0.3 - 1.2 mg/dL 0.2(L) 0.5 -  Alkaline Phos 38 - 126 U/L 107 125 -  AST 15 - 41 U/L 24 31 -  ALT 0 - 44 U/L 23 23 -    CBC Latest Ref Rng & Units 12/14/2019 12/13/2019  12/12/2019  WBC 4.0 - 10.5 K/uL 10.1 9.7 11.2(H)  Hemoglobin 12.0 - 15.0 g/dL 11.6(L) 11.7(L) 11.9(L)  Hematocrit 36.0 - 46.0 % 37.7 37.8 38.0  Platelets 150 - 400 K/uL 311 347 335    ABG    Component Value Date/Time   PHART 7.487 (H) 12/05/2019 0352   PCO2ART 51.0 (H) 12/05/2019 0352   PO2ART 52.0 (L) 12/05/2019 0352   HCO3 38.7 (H) 12/05/2019 0352   TCO2 40 (H) 12/05/2019 0352   ACIDBASEDEF 4.0 (H) 11/12/2019 1504   O2SAT 89.0 12/05/2019 0352    CBG (last 3)  Recent Labs    12/13/19 2342 12/14/19 0428 12/14/19 [place], MSN, AGACNP-BC [facility] 12/14/2019, 10:28 AM

## 2019-12-14 NOTE — Procedures (Signed)
  Tracheostomy change Note  First trach change, inserted originally on 3/29.  #6 Shiley cuffed trach exchanged over tube exchanger to #6 cuffless without any difficulty.  RT at bedside to assist.  Stoma site wnl.  Patient tolerated procedure well.  No bleeding.  Positive ETCO2 change post procedure.  Trach secured with trach ties and inner cannula inserted.  Continuous telemtry and pulse ox during procedure- stable throughout.    Kennieth Rad, MSN, AGACNP-BC [facility] 12/14/2019, 3:11 PM

## 2019-12-15 DIAGNOSIS — G9341 Metabolic encephalopathy: Secondary | ICD-10-CM | POA: Diagnosis not present

## 2019-12-15 DIAGNOSIS — J9601 Acute respiratory failure with hypoxia: Secondary | ICD-10-CM | POA: Diagnosis not present

## 2019-12-15 LAB — CBC WITH DIFFERENTIAL/PLATELET
Abs Immature Granulocytes: 0.04 10*3/uL (ref 0.00–0.07)
Basophils Absolute: 0.1 10*3/uL (ref 0.0–0.1)
Basophils Relative: 1 %
Eosinophils Absolute: 0.7 10*3/uL — ABNORMAL HIGH (ref 0.0–0.5)
Eosinophils Relative: 5 %
HCT: 37.8 % (ref 36.0–46.0)
Hemoglobin: 11.8 g/dL — ABNORMAL LOW (ref 12.0–15.0)
Immature Granulocytes: 0 %
Lymphocytes Relative: 21 %
Lymphs Abs: 2.6 10*3/uL (ref 0.7–4.0)
MCH: 28.1 pg (ref 26.0–34.0)
MCHC: 31.2 g/dL (ref 30.0–36.0)
MCV: 90 fL (ref 80.0–100.0)
Monocytes Absolute: 1 10*3/uL (ref 0.1–1.0)
Monocytes Relative: 8 %
Neutro Abs: 8.3 10*3/uL — ABNORMAL HIGH (ref 1.7–7.7)
Neutrophils Relative %: 65 %
Platelets: 295 10*3/uL (ref 150–400)
RBC: 4.2 MIL/uL (ref 3.87–5.11)
RDW: 18.8 % — ABNORMAL HIGH (ref 11.5–15.5)
WBC: 12.7 10*3/uL — ABNORMAL HIGH (ref 4.0–10.5)
nRBC: 0 % (ref 0.0–0.2)

## 2019-12-15 LAB — GLUCOSE, CAPILLARY
Glucose-Capillary: 202 mg/dL — ABNORMAL HIGH (ref 70–99)
Glucose-Capillary: 131 mg/dL — ABNORMAL HIGH (ref 70–99)
Glucose-Capillary: 95 mg/dL (ref 70–99)
Glucose-Capillary: 126 mg/dL — ABNORMAL HIGH (ref 70–99)
Glucose-Capillary: 145 mg/dL — ABNORMAL HIGH (ref 70–99)
Glucose-Capillary: 191 mg/dL — ABNORMAL HIGH (ref 70–99)

## 2019-12-15 NOTE — Progress Notes (Signed)
Occupational Therapy Treatment Patient Details Name: Susan [name] MRN: [id number] DOB: 04-04-1961 Today's Date: 12/15/2019    History of present illness 59 year old female smoker admitted 3/10 with overdose complicated by aspiration PNA with MRSA,  Pneumococcal bacteremia, toxic metabolic encephalopathy and septic shock.  Frequent falls at home,Tobacco abuse, diabetes with painful polyneuropathy, bipolar disease, GERD, HIV, schizophrenia, history of IV drug abuse, seizure disorder. Intubated 3/10-3/29 3/23 MRI brain >>Acute nonhemorrhagic infarcts in the posterior MCA territory bilaterally. Tracheostomy placed 3/29   OT comments  Cotx with PT to maximize pt safety and functional performance. Pt making slow steady progress in therapy, continuing to require 2 person assist for self-care and transfer tasks. Pt initially lethargic requiring max multimodal cues to maintain alertness and initiate therapy tasks. Pt tolerated sitting EOB 10 min with variable min guard to max assist. Pt able to maintain static sitting balance ~10 sec at a time before requiring assist. Right lateral lean/LOB while seated with pt unable to self-correct. Session focused on grooming/hygiene tasks while seated EOB with pt unable to initiate tasks without hand over hand assist. Once comb or washcloth brought to face, pt able to engage in task ~5 sec before stopping and becoming restless. Pt more alert as session progressed requesting to get back into bed. Pt noted to be incontinent of bowel in bed with pt requiring assist to roll and complete peri care. Pt positioned for comfort and placed back in four point restraints. OT will continue to follow acutely. Continue to recommend SNF placement for additional rehab prior to discharge home.    Follow Up Recommendations  SNF;Supervision/Assistance - 24 hour    Equipment Recommendations  Other (comment)(TBD at next venue of care)    Recommendations for Other Services       Precautions / Restrictions Precautions Precautions: Fall Precaution Comments: 4 point restraint. PEG. Trach collar 5L at 28%FiO2. Bilateral mitts.  Restrictions Weight Bearing Restrictions: No       Mobility Bed Mobility Overal bed mobility: Needs Assistance Bed Mobility: Sit to Supine;Rolling;Sidelying to Sit Rolling: Max assist;+2 for physical assistance;+2 for safety/equipment Sidelying to sit: Mod assist;+2 for physical assistance;+2 for safety/equipment   Sit to supine: Max assist;+2 for physical assistance;+2 for safety/equipment   General bed mobility comments: Pt lethargic requiring max multimodal cues to initiate bed mobility tasks. Once movement started pt becoming more alert and able to assist with sitting upright. Max multimodal cues for hand placement while rolling.  Transfers                 General transfer comment: Not attempted this date. Pt with little to no ability to follow commands. Poor safety awareness.     Balance Overall balance assessment: Needs assistance Sitting-balance support: Feet supported;Bilateral upper extremity supported Sitting balance-Leahy Scale: Poor Sitting balance - Comments: Pt tolerated sitting EOB 10 min with variable min guard to max assist for balance. Right lateral lean while sitting with pt unable to self-correct.  Postural control: Right lateral lean     Standing balance comment: Not attempted this date.                            ADL either performed or assessed with clinical judgement   ADL Overall ADL's : Needs assistance/impaired     Grooming: Wash/dry hands;Wash/dry face;Brushing hair;Maximal assistance;Sitting Grooming Details (indicate cue type and reason): While seated EOB. Pt does not initiate tasks on her own requiring HOH assist to complete.  Lower Body Dressing: Total assistance;Bed level       Toileting- Clothing Manipulation and Hygiene: Total assistance;+2 for physical  assistance;Bed level Toileting - Clothing Manipulation Details (indicate cue type and reason): Pt noted to be incontinent of bowel in bed requiring total assist for peri care. 2nd person assist to manage pt and lines as pt was becoming increasingly restless and agitated.        General ADL Comments: Pt tolerated sitting EOB ~10 min this date with variable min guard to max assist. Little to no command following.      Vision       Perception     Praxis      Cognition Arousal/Alertness: Lethargic(Initially lethargic, becoming more awake at end of session) Behavior During Therapy: Restless;Agitated;Impulsive(Becoming more restless and agitated at end of session) Overall Cognitive Status: Impaired/Different from baseline Area of Impairment: Orientation;Attention;Memory;Following commands;Safety/judgement;Awareness;Problem solving                 Orientation Level: Disoriented to;Place;Time;Situation Current Attention Level: Focused Memory: Decreased short-term memory;Decreased recall of precautions Following Commands: Follows one step commands with increased time;Follows one step commands inconsistently Safety/Judgement: Decreased awareness of safety;Decreased awareness of deficits Awareness: Intellectual Problem Solving: Decreased initiation;Difficulty sequencing;Requires verbal cues;Requires tactile cues;Slow processing General Comments: Pt initially lethargic requiring max multimodal cues to maintain alertness. As session progressed pt becoming increasingly restless and agitated requesting to get back into bed. Pt wide awake at end of session stating "Lets go" and "Ready?" Little to no command following.         Exercises     Shoulder Instructions       General Comments VSS.     Pertinent Vitals/ Pain       Pain Assessment: Faces Faces Pain Scale: No hurt  Home Living                                          Prior Functioning/Environment               Frequency           Progress Toward Goals  OT Goals(current goals can now be found in the care plan section)  Progress towards OT goals: Progressing toward goals  ADL Goals Pt Will Perform Grooming: with mod assist;sitting Additional ADL Goal #1: Pt will complete grooming task with mod A while sitting EOB for 5-10 minutes with min postural support Additional ADL Goal #2: Pt will follow one step command with <3 VC's to wash face while sitting up in chair  Plan Discharge plan remains appropriate    Co-evaluation    PT/OT/SLP Co-Evaluation/Treatment: Yes Reason for Co-Treatment: Complexity of the patient's impairments (multi-system involvement);Necessary to address cognition/behavior during functional activity;For patient/therapist safety;To address functional/ADL transfers   OT goals addressed during session: ADL's and self-care      AM-PAC OT "6 Clicks" Daily Activity     Outcome Measure   Help from another person eating meals?: Total Help from another person taking care of personal grooming?: A Lot Help from another person toileting, which includes using toliet, bedpan, or urinal?: Total Help from another person bathing (including washing, rinsing, drying)?: Total Help from another person to put on and taking off regular upper body clothing?: Total Help from another person to put on and taking off regular lower body clothing?: Total 6 Click Score: 7    End of  Session Equipment Utilized During Treatment: Oxygen  OT Visit Diagnosis: Other symptoms and signs involving cognitive function;Muscle weakness (generalized) (M62.81);Unsteadiness on feet (R26.81);Other abnormalities of gait and mobility (R26.89)   Activity Tolerance Patient limited by fatigue;Other (comment)(cognitive status)   Patient Left in bed;with call bell/phone within reach;with bed alarm set   Nurse Communication Mobility status        Time: [id number] OT Time Calculation (min): 28  min  Charges: OT General Charges $OT Visit: 1 Visit OT Treatments $Self Care/Home Management : 8-22 mins  Mauri Brooklyn OTR/L [phone]   Mauri Brooklyn 12/15/2019, 12:00 PM

## 2019-12-15 NOTE — Progress Notes (Signed)
Physical Therapy Treatment Patient Details Name: Susan [name] MRN: [id number] DOB: 24-Mar-1961 Today's Date: 12/15/2019    History of Present Illness 59 year old female smoker admitted 3/10 with overdose complicated by aspiration PNA with MRSA,  Pneumococcal bacteremia, toxic metabolic encephalopathy and septic shock.  Frequent falls at home,Tobacco abuse, diabetes with painful polyneuropathy, bipolar disease, GERD, HIV, schizophrenia, history of IV drug abuse, seizure disorder. Intubated 3/10-3/29 3/23 MRI brain >>Acute nonhemorrhagic infarcts in the posterior MCA territory bilaterally. Tracheostomy placed 3/29    PT Comments    Pt drowsy upon arrival to room, and required max multimodal cuing for increased wakefulness and participation in PT/OT session. Pt required mod-max +2 assist for bed mobility and EOB sitting, PT challenging pt's tolerance for sitting EOB (~10 mins), upright posture with tactile facilitation, and lateral weight shifting. Pt with little to no command following this session, and was getting increasingly irritated with sitting EOB. Session also limited by fecal incontinence requiring total assist for pericare. Will continue to follow acutely.     Follow Up Recommendations  SNF     Equipment Recommendations  None recommended by PT    Recommendations for Other Services       Precautions / Restrictions Precautions Precautions: Fall Precaution Comments: 4 point restraint. PEG. Trach collar 5L at 28%FiO2. Bilateral mitts.  Restrictions Weight Bearing Restrictions: No    Mobility  Bed Mobility Overal bed mobility: Needs Assistance Bed Mobility: Sit to Supine;Rolling;Sidelying to Sit Rolling: Max assist;+2 for physical assistance;+2 for safety/equipment Sidelying to sit: Mod assist;+2 for physical assistance;+2 for safety/equipment   Sit to supine: Max assist;+2 for physical assistance;+2 for safety/equipment   General bed mobility comments: Max +2 for rolling  task in preparation for sitting and during pericare for truncal translation and maintaining roll, max multimodal cuing for hnad placement on bedrails to assist in roll. Mod assist +2 for sidelying to sit because pt more awake and automatically came to upright with use of RUE pushing on bed, required assist for LEs and completion of trunk elevation. Max +2 for return to supine as pt becoming increasingly irritated and following no commands.  Transfers                 General transfer comment: Not attempted this date. Pt with little to no ability to follow commands. Poor safety awareness.   Ambulation/Gait             General Gait Details: unable   Stairs             Wheelchair Mobility    Modified Rankin (Stroke Patients Only) Modified Rankin (Stroke Patients Only) Pre-Morbid Rankin Score: No symptoms Modified Rankin: Severe disability     Balance Overall balance assessment: Needs assistance Sitting-balance support: Feet supported;Bilateral upper extremity supported Sitting balance-Leahy Scale: Poor Sitting balance - Comments: Pt tolerated sitting EOB 10 min with variable min guard (~10 seconds max) to max assist for balance. PT provided posterior truncal support and sternal/shoulder girdle cuing for upright chest, pt with VERY heavy anterior leaning without these cues. Right lateral lean while sitting with pt unable to self-correct without mod assist. Postural control: Right lateral lean     Standing balance comment: Not attempted this date.                             Cognition Arousal/Alertness: Lethargic(Initially lethargic, becoming more awake at end of session) Behavior During Therapy: Restless;Agitated;Impulsive(Becoming more restless and agitated at  end of session) Overall Cognitive Status: Impaired/Different from baseline Area of Impairment: Orientation;Attention;Memory;Following commands;Safety/judgement;Awareness;Problem solving                  Orientation Level: Disoriented to;Place;Time;Situation Current Attention Level: Focused Memory: Decreased short-term memory;Decreased recall of precautions Following Commands: Follows one step commands with increased time;Follows one step commands inconsistently Safety/Judgement: Decreased awareness of safety;Decreased awareness of deficits Awareness: Intellectual Problem Solving: Decreased initiation;Difficulty sequencing;Requires verbal cues;Requires tactile cues;Slow processing General Comments: Pt initially lethargic requiring max multimodal cues to maintain alertness, states "I am tired". As session progressed pt becoming increasingly restless and agitated requesting to get back into bed. Pt wide awake at end of session stating "Lets go" and "Ready?" Little to no command following.      Exercises      General Comments General comments (skin integrity, edema, etc.): vss, 5L 28% trach      Pertinent Vitals/Pain Pain Assessment: Faces Faces Pain Scale: No hurt Pain Intervention(s): Limited activity within patient's tolerance;Monitored during session    Home Living                      Prior Function            PT Goals (current goals can now be found in the care plan section) Acute Rehab PT Goals PT Goal Formulation: Patient unable to participate in goal setting Time For Goal Achievement: 12/22/19 Potential to Achieve Goals: Fair Progress towards PT goals: Progressing toward goals    Frequency    Min 3X/week      PT Plan Current plan remains appropriate    Co-evaluation PT/OT/SLP Co-Evaluation/Treatment: Yes Reason for Co-Treatment: Necessary to address cognition/behavior during functional activity;Complexity of the patient's impairments (multi-system involvement);For patient/therapist safety PT goals addressed during session: Mobility/safety with mobility;Balance OT goals addressed during session: ADL's and self-care      AM-PAC PT "6  Clicks" Mobility   Outcome Measure  Help needed turning from your back to your side while in a flat bed without using bedrails?: A Lot Help needed moving from lying on your back to sitting on the side of a flat bed without using bedrails?: A Lot Help needed moving to and from a bed to a chair (including a wheelchair)?: Total Help needed standing up from a chair using your arms (e.g., wheelchair or bedside chair)?: Total Help needed to walk in hospital room?: Total Help needed climbing 3-5 steps with a railing? : Total 6 Click Score: 8    End of Session Equipment Utilized During Treatment: Oxygen Activity Tolerance: Treatment limited secondary to agitation;Other (comment)(impulsivity and decreased ability to command follow) Patient left: in bed;with bed alarm set;with restraints reapplied Nurse Communication: Mobility status PT Visit Diagnosis: Unsteadiness on feet (R26.81);Other abnormalities of gait and mobility (R26.89);Repeated falls (R29.6);Muscle weakness (generalized) (M62.81);History of falling (Z91.81);Difficulty in walking, not elsewhere classified (R26.2);Hemiplegia and hemiparesis Hemiplegia - Right/Left: Left Hemiplegia - caused by: Cerebral infarction     Time: [id number] PT Time Calculation (min) (ACUTE ONLY): 30 min  Charges:  $Neuromuscular Re-education: 8-22 mins                    Natalea Sutliff E, PT Acute Rehabilitation Services Pager [phone]  Office [phone]   Jaqwan Wieber D Elonda Husky 12/15/2019, 1:36 PM

## 2019-12-15 NOTE — Progress Notes (Signed)
PROGRESS NOTE    Susan [name]  [id number] DOB: October 14, 1960 DOA: 11/11/2019 PCP: Hoyt Koch, MD    Brief Narrative:  59 year old female with medical history significant for HIV, diabetes with neuropathy, seizure disorder, tobacco abuse, bipolar disease, schizophrenia, history of IV drug abuse admitted 3/10 to ICU with overdose complicated by aspiration pneumonia with MRSA, pneumococcal bacteremia, toxic metabolic encephalopathy, septic shock and acute versus subacute bilateral MCA infarcts. Patient was intubated on 3/10, difficult to wean off, tracheostomy placed on 3/29.  [facility] assumed care on 12/06/2019.  Assessment & Plan:   Principal Problem:   Acute respiratory failure with hypoxemia (HCC) Active Problems:   HIV disease ([place])   Drug-induced hypotension   DNR (do not resuscitate) discussion   AKI (acute kidney injury) ([place])   Pneumococcal bacteremia   Pneumonia   Aortic valve endocarditis   Acute kidney injury (AKI) with acute tubular necrosis (ATN) (HCC)   Acute metabolic encephalopathy   Cerebral embolism with cerebral infarction   Status post tracheostomy [facility])   Palliative care by specialist   Adult failure to thrive   Acute hypoxic/hypercapnic respiratory failure with ARDS from MRSA PNA and aspiration pneumonia Hx of asthma Intubated on 3/10, difficult to wean off, status post trach on 3/29 Currently on trach collar with 5 L of O2 Continue Pulmicort nebulizer, DuoNeb as needed Continue with VAP prevention order set PCCM had been following for trach care. Changed to cuffless trach 4/12  Aerococcus bacteremia 3/25 Klebsiella bronchitis/pneumonia 3/25 Seems stable Pt had completed 7-day course cefepime as of 4/1  Acute bilateral large bilateral posterior MCA infarcts - 3/23with severe encephalopathy TEE 3/16 with no sign of endocarditis  MRI brain c/w b/l posterior MCA territory infarcts; MRA/ MRV neg Continue aspirin, Crestor PT/OT  consulted and had been following, disposition continues to be complicated by patient needing restraints for safety as she tries to climb out of bed  Acute metabolic encephalopathy/hospital delirium Multifactorial, including sepsis, hypoxia, with underlying history of schizophrenia/seizures, anoxic brain injury Persistent agitation as mentioned above Continue current regimen:Cogentin, BuSpar, doxepin, Neurontin, Zoloft, Seroquel Patient continues to try to climb out of bed and pull at trach site, thus being a danger to herself. Will continue with soft restraints for patient safety per above  Acute systolic heart failure with sepsis induced CM Hx of HTN, HLD Currently on clonidine, amlodipine, maxide, metoprolol ACE inhibitor on hold Continue with Crestor as tolerated  DM type II poorly controlled HbA1C 12.3 from 11/11/19 SSI, lantus, hypoglycemic protocol, continue Accu-Cheks  HIV Continue home Descovy, Tivicay as tolerated  At risk malnutrition S/p PEG placement on 4/5, by IR Continue with tube feeds as tolerated  Obesity Lifestyle modification advised once able to participate in conversation  [place] Very poor prognosis due to multiple comorbidities Palliative consulted and had been following   DVT prophylaxis: SCD's Code Status: Full Family Communication: Pt in room, family not at bedside Status is: Inpatient  Remains inpatient appropriate because:Unsafe d/c plan as pt still requires restraints for her own safety  Dispo: The patient is from: Home              Anticipated d/c is to: SNF              Anticipated d/c date is: > 3 days              Patient currently is not medically stable to d/c.  Consultants:   PCCM  ID  Neurology Procedures:   Mechanical ventilation  TEE  Trach changed to cuffless 4/12   Antimicrobials: Anti-infectives (From admission, onward)   Start     Dose/Rate Route Frequency Ordered Stop   12/07/19 1603  ceFAZolin (ANCEF)  powder       As needed 12/07/19 1603 12/07/19 1603   12/07/19 1426  ceFAZolin (ANCEF) 2-4 GM/100ML-% IVPB    Note to Pharmacy: Rodney Booze   : cabinet override      12/07/19 1426 12/08/19 0229   12/07/19 0000  ceFAZolin (ANCEF) IVPB 2g/100 mL premix     2 g 200 mL/hr over 30 Minutes Intravenous To Radiology 12/04/19 1142 12/07/19 0108   11/27/19 1145  ceFEPIme (MAXIPIME) 2 g in sodium chloride 0.9 % 100 mL IVPB  Status:  Discontinued     2 g 200 mL/hr over 30 Minutes Intravenous Every 12 hours 11/27/19 1056 12/03/19 0954   11/17/19 1600  penicillin G potassium 9 Million Units in dextrose 5 % 500 mL continuous infusion     9 Million Units 41.7 mL/hr over 12 Hours Intravenous Every 12 hours 11/17/19 1151 11/24/19 2359   11/16/19 1039  Ampicillin-Sulbactam (UNASYN) 3 g in sodium chloride 0.9 % 100 mL IVPB  Status:  Discontinued     3 g 200 mL/hr over 30 Minutes Intravenous Every 6 hours 11/16/19 1009 11/17/19 1151   11/13/19 1600  Ampicillin-Sulbactam (UNASYN) 3 g in sodium chloride 0.9 % 100 mL IVPB  Status:  Discontinued     3 g 200 mL/hr over 30 Minutes Intravenous Every 12 hours 11/13/19 1547 11/16/19 1009   11/13/19 1500  vancomycin (VANCOCIN) IVPB 1000 mg/200 mL premix  Status:  Discontinued     1,000 mg 200 mL/hr over 60 Minutes Intravenous Every 48 hours 11/13/19 0713 11/17/19 1549   11/13/19 1000  dolutegravir (TIVICAY) tablet 50 mg     50 mg Oral Daily 11/12/19 1251     11/13/19 1000  emtricitabine-tenofovir AF (DESCOVY) 200-25 MG per tablet 1 tablet     1 tablet Per Tube Daily 11/12/19 1251     11/12/19 1500  piperacillin-tazobactam (ZOSYN) IVPB 3.375 g  Status:  Discontinued     3.375 g 12.5 mL/hr over 240 Minutes Intravenous Every 8 hours 11/12/19 1359 11/13/19 1547   11/11/19 2200  Ampicillin-Sulbactam (UNASYN) 3 g in sodium chloride 0.9 % 100 mL IVPB  Status:  Discontinued     3 g 200 mL/hr over 30 Minutes Intravenous Every 8 hours 11/11/19 1723 11/12/19 1317    11/11/19 1721  vancomycin variable dose per unstable renal function (pharmacist dosing)  Status:  Discontinued      Does not apply See admin instructions 11/11/19 1723 11/13/19 0713   11/11/19 1700  vancomycin (VANCOCIN) 2,250 mg in sodium chloride 0.9 % 500 mL IVPB     2,250 mg 250 mL/hr over 120 Minutes Intravenous  Once 11/11/19 1648 11/11/19 2005   11/11/19 1645  bictegravir-emtricitabine-tenofovir AF (BIKTARVY) 50-200-25 MG per tablet 1 tablet  Status:  Discontinued     1 tablet Oral Daily 11/11/19 1632 11/11/19 1638   11/11/19 1645  bictegravir-emtricitabine-tenofovir AF (BIKTARVY) 50-200-25 MG per tablet 1 tablet  Status:  Discontinued    Note to Pharmacy: Give via feeding tube if able   1 tablet Oral Daily 11/11/19 1638 11/12/19 1251   11/11/19 1545  vancomycin (VANCOCIN) IVPB 1000 mg/200 mL premix  Status:  Discontinued     1,000 mg 200 mL/hr over 60 Minutes Intravenous  Once 11/11/19 1544 11/11/19 1648   11/11/19  1545  piperacillin-tazobactam (ZOSYN) IVPB 3.375 g     3.375 g 100 mL/hr over 30 Minutes Intravenous  Once 11/11/19 1544 11/11/19 1710      Subjective: Remains pleasant. Asks "How are you?"  However does not seem to respond to questions appropriately and does not seem to follow simple commands  Objective: Vitals:   12/15/19 0813 12/15/19 0847 12/15/19 1132 12/15/19 1507  BP: 129/68 129/68 129/68   Pulse:  88 84 86  Resp: (!) 24 (!) 21    Temp: 98.4 F (36.9 C)     TempSrc: Axillary     SpO2: 97% 100%    Weight:      Height:        Intake/Output Summary (Last 24 hours) at 12/15/2019 1632 Last data filed at 12/15/2019 [id number] Gross per 24 hour  Intake 397.5 ml  Output 730 ml  Net -332.5 ml   Filed Weights   12/13/19 0338 12/14/19 0500 12/15/19 0334  Weight: 92 kg 92.9 kg 92.2 kg    Examination: General exam: Conversant, in no acute distress Respiratory system: normal chest rise, clear, no audible wheezing, trach in place Cardiovascular system: regular  rhythm, s1-s2 Gastrointestinal system: Nondistended, nontender, pos BS Central nervous system: No seizures, no tremors Extremities: No cyanosis, no joint deformities Skin: No rashes, no pallor Psychiatry: bright affect // no auditory hallucinations   Data Reviewed: I have personally reviewed following labs and imaging studies  CBC: Recent Labs  Lab 12/11/19 0304 12/12/19 0655 12/13/19 0500 12/14/19 0603 12/15/19 0636  WBC 11.5* 11.2* 9.7 10.1 12.7*  NEUTROABS 6.1 6.6 4.9 5.2 8.3*  HGB 12.3 11.9* 11.7* 11.6* 11.8*  HCT 39.4 38.0 37.8 37.7 37.8  MCV 90.2 89.4 90.4 91.3 90.0  PLT 380 335 347 311 [id number]   Basic Metabolic Panel: Recent Labs  Lab 12/09/19 0521 12/10/19 0500 12/11/19 0304 12/12/19 0655 12/14/19 0603  NA 138 138 138 136 136  K 3.8 3.7 3.9 3.6 4.1  CL 96* 95* 95* 93* 91*  CO2 33* 34* 31 33* 31  GLUCOSE 110* 147* 61* 169* 112*  BUN 20 25* 29* 26* 39*  CREATININE 0.74 0.86 0.86 0.94 1.04*  CALCIUM 9.1 9.1 9.4 9.3 9.4   GFR: Estimated Creatinine Clearance: 64.4 mL/min (A) (by C-G formula based on SCr of 1.04 mg/dL (H)). Liver Function Tests: Recent Labs  Lab 12/12/19 0655 12/14/19 0603  AST 31 24  ALT 23 23  ALKPHOS 125 107  BILITOT 0.5 0.2*  PROT 8.0 7.6  ALBUMIN 2.4* 2.5*   No results for input(s): LIPASE, AMYLASE in the last 168 hours. No results for input(s): AMMONIA in the last 168 hours. Coagulation Profile: No results for input(s): INR, PROTIME in the last 168 hours. Cardiac Enzymes: No results for input(s): CKTOTAL, CKMB, CKMBINDEX, TROPONINI in the last 168 hours. BNP (last 3 results) No results for input(s): PROBNP in the last 8760 hours. HbA1C: No results for input(s): HGBA1C in the last 72 hours. CBG: Recent Labs  Lab 12/14/19 1938 12/14/19 2314 12/15/19 0336 12/15/19 0843 12/15/19 1222  GLUCAP 70 152* 202* 131* 95   Lipid Profile: No results for input(s): CHOL, HDL, LDLCALC, TRIG, CHOLHDL, LDLDIRECT in the last 72  hours. Thyroid Function Tests: No results for input(s): TSH, T4TOTAL, FREET4, T3FREE, THYROIDAB in the last 72 hours. Anemia Panel: No results for input(s): VITAMINB12, FOLATE, FERRITIN, TIBC, IRON, RETICCTPCT in the last 72 hours. Sepsis Labs: No results for input(s): PROCALCITON, LATICACIDVEN in the last 168 hours.  No results found for this or any previous visit (from the past 240 hour(s)).   Radiology Studies: No results found.  Scheduled Meds: . amLODipine  10 mg Per Tube Daily  . aspirin  81 mg Per Tube Daily  . benztropine  0.5 mg Per Tube Daily  . budesonide (PULMICORT) nebulizer solution  0.5 mg Nebulization BID  . busPIRone  15 mg Per Tube TID  . chlorhexidine  15 mL Mouth Rinse BID  . Chlorhexidine Gluconate Cloth  6 each Topical Daily  . cloNIDine  0.2 mg Per Tube TID  . dolutegravir  50 mg Oral Daily  . doxepin  50 mg Per Tube QHS  . emtricitabine-tenofovir AF  1 tablet Per Tube Daily  . feeding supplement (PRO-STAT SUGAR FREE 64)  30 mL Per Tube TID  . free water  200 mL Per Tube Q4H  . gabapentin  300 mg Per Tube Q8H  . insulin aspart  0-20 Units Subcutaneous Q4H  . insulin aspart  5 Units Subcutaneous Q4H  . insulin glargine  10 Units Subcutaneous BID  . mouth rinse  15 mL Mouth Rinse q12n4p  . metoprolol tartrate  12.5 mg Per Tube BID  . pantoprazole sodium  40 mg Per Tube Daily  . polyethylene glycol  17 g Oral Daily  . QUEtiapine  200 mg Per Tube BID  . rosuvastatin  20 mg Per Tube q1800  . sennosides  15 mL Per Tube BID  . sertraline  50 mg Per Tube Daily  . triamterene-hydrochlorothiazide  1 tablet Per Tube Daily   Continuous Infusions: . sodium chloride Stopped (12/07/19 0216)  . feeding supplement (JEVITY 1.2 CAL) 50 mL/hr at 12/15/19 0027     LOS: 34 days   Marylu Lund, MD [facility] Pager On Amion  If 7PM-7AM, please contact night-coverage 12/15/2019, 4:32 PM

## 2019-12-15 NOTE — Plan of Care (Signed)
  Problem: Education: Goal: Knowledge of General Education information will improve Description: Including pain rating scale, medication(s)/side effects and non-pharmacologic comfort measures Outcome: Progressing   Problem: Health Behavior/Discharge Planning: Goal: Ability to manage health-related needs will improve Outcome: Progressing   Problem: Clinical Measurements: Goal: Ability to maintain clinical measurements within normal limits will improve Outcome: Progressing Goal: Will remain free from infection Outcome: Progressing Goal: Diagnostic test results will improve Outcome: Progressing Goal: Respiratory complications will improve Outcome: Progressing Goal: Cardiovascular complication will be avoided Outcome: Progressing   Problem: Activity: Goal: Risk for activity intolerance will decrease Outcome: Progressing   Problem: Coping: Goal: Level of anxiety will decrease Outcome: Progressing   Problem: Elimination: Goal: Will not experience complications related to bowel motility Outcome: Progressing Goal: Will not experience complications related to urinary retention Outcome: Progressing   Problem: Pain Managment: Goal: General experience of comfort will improve Outcome: Progressing   Problem: Safety: Goal: Ability to remain free from injury will improve Outcome: Progressing   Problem: Skin Integrity: Goal: Risk for impaired skin integrity will decrease Outcome: Progressing   Problem: Education: Goal: Knowledge of disease or condition will improve Outcome: Progressing Goal: Knowledge of secondary prevention will improve Outcome: Progressing Goal: Knowledge of patient specific risk factors addressed and post discharge goals established will improve Outcome: Progressing

## 2019-12-15 NOTE — Progress Notes (Signed)
Nutrition Follow-up  DOCUMENTATION CODES:   Obesity unspecified  INTERVENTION:   Continue Jevity 1.2 @ 50 ml/hr via PEG   30 ml Prostat TID  200 ml free water flush every 4 hours  Regimen provides 1740 kcal, 112 gm protein, 972 ml free water daily, meeting 100% of estimated kcal and protein needs  Continue current free water flushes 200 ml every 4 hours for total free water intake = 2172 ml per day.  NUTRITION DIAGNOSIS:   Inadequate oral intake related to inability to eat as evidenced by NPO status.  Ongoing  GOAL:   Patient will meet greater than or equal to 90% of their needs  Met with TF  MONITOR:   TF tolerance, Skin, Labs  REASON FOR ASSESSMENT:   Ventilator, Consult Enteral/tube feeding initiation and management  ASSESSMENT:   59 yo female admitted S/P fall at home, unresponsive, hypotensive. Intubated by EMS. Suspected overdose with toxic/metabolic encephalopathy and aspiration PNA. PMH includes schizophrenia, HIV, DM, neuropathy, HTN.  3/22 - CT head positive for stroke 3/23 -MRI revealed bilateral acute infarcts in the posterior MCA territory 3/29 - S/P trach 4/05 - S/P PEG 4/6- cortrak removed  Reviewed I/O's: +321 ml x 24 hours and -21.2 L since 12/01/19  UOP: 730 ml x 24 hours  Trach changed from #6 shiley cuffed trach to #6 cuffless on 12/14/19. She remains on trach collar.   SLP following for readiness for diet advancement. Last evaluated on 12/10/19 and still recommend NPO status.   Pt sitting up in bed, resting quietly at time of visit. She continues to receive TF via PEG and tolerating well.   Plan for d/c to SNF once medically stable.  Labs reviewed: CBGS: 95-202 (inpatient orders for glycemic control are 0-20 units insulin aspart every 4 hours. 5 unit insuslin aspart every 4 hours, and 10 units insulin glargine BID).   Diet Order:   Diet Order            Diet NPO time specified  Diet effective now              EDUCATION  NEEDS:   Not appropriate for education at this time  Skin:  Skin Assessment: Skin Integrity Issues: Skin Integrity Issues:: Other (Comment) Other: non-pressure wound buttocks  Last BM:  12/14/19  Height:   Ht Readings from Last 1 Encounters:  11/17/19 5' 3"  (1.6 m)    Weight:   Wt Readings from Last 1 Encounters:  12/15/19 92.2 kg    Ideal Body Weight:  52.3 kg  BMI:  Body mass index is 36.01 kg/m.  Estimated Nutritional Needs:   Kcal:  1700-1900  Protein:  105-130 gm  Fluid:  >/= 1.7 L    Loistine Chance, RD, LDN, [place] Registered Dietitian II Certified Diabetes Care and Education Specialist Please refer to [facility] for RD and/or RD on-call/weekend/after hours pager

## 2019-12-16 DIAGNOSIS — R627 Adult failure to thrive: Secondary | ICD-10-CM | POA: Diagnosis not present

## 2019-12-16 DIAGNOSIS — N170 Acute kidney failure with tubular necrosis: Secondary | ICD-10-CM | POA: Diagnosis not present

## 2019-12-16 DIAGNOSIS — G9341 Metabolic encephalopathy: Secondary | ICD-10-CM | POA: Diagnosis not present

## 2019-12-16 DIAGNOSIS — J9601 Acute respiratory failure with hypoxia: Secondary | ICD-10-CM | POA: Diagnosis not present

## 2019-12-16 LAB — CBC WITH DIFFERENTIAL/PLATELET
Abs Immature Granulocytes: 0.03 10*3/uL (ref 0.00–0.07)
Basophils Absolute: 0.1 10*3/uL (ref 0.0–0.1)
Basophils Relative: 1 %
Eosinophils Absolute: 0.6 10*3/uL — ABNORMAL HIGH (ref 0.0–0.5)
Eosinophils Relative: 5 %
HCT: 36.5 % (ref 36.0–46.0)
Hemoglobin: 11.3 g/dL — ABNORMAL LOW (ref 12.0–15.0)
Immature Granulocytes: 0 %
Lymphocytes Relative: 23 %
Lymphs Abs: 2.6 10*3/uL (ref 0.7–4.0)
MCH: 27.5 pg (ref 26.0–34.0)
MCHC: 31 g/dL (ref 30.0–36.0)
MCV: 88.8 fL (ref 80.0–100.0)
Monocytes Absolute: 1 10*3/uL (ref 0.1–1.0)
Monocytes Relative: 9 %
Neutro Abs: 6.8 10*3/uL (ref 1.7–7.7)
Neutrophils Relative %: 62 %
Platelets: 266 10*3/uL (ref 150–400)
RBC: 4.11 MIL/uL (ref 3.87–5.11)
RDW: 18.5 % — ABNORMAL HIGH (ref 11.5–15.5)
WBC: 11.1 10*3/uL — ABNORMAL HIGH (ref 4.0–10.5)
nRBC: 0 % (ref 0.0–0.2)

## 2019-12-16 LAB — GLUCOSE, CAPILLARY
Glucose-Capillary: 102 mg/dL — ABNORMAL HIGH (ref 70–99)
Glucose-Capillary: 110 mg/dL — ABNORMAL HIGH (ref 70–99)
Glucose-Capillary: 133 mg/dL — ABNORMAL HIGH (ref 70–99)
Glucose-Capillary: 141 mg/dL — ABNORMAL HIGH (ref 70–99)
Glucose-Capillary: 145 mg/dL — ABNORMAL HIGH (ref 70–99)
Glucose-Capillary: 172 mg/dL — ABNORMAL HIGH (ref 70–99)
Glucose-Capillary: 197 mg/dL — ABNORMAL HIGH (ref 70–99)

## 2019-12-16 LAB — COMPREHENSIVE METABOLIC PANEL
ALT: 20 U/L (ref 0–44)
AST: 23 U/L (ref 15–41)
Albumin: 2.3 g/dL — ABNORMAL LOW (ref 3.5–5.0)
Alkaline Phosphatase: 110 U/L (ref 38–126)
Anion gap: 12 (ref 5–15)
BUN: 38 mg/dL — ABNORMAL HIGH (ref 6–20)
CO2: 33 mmol/L — ABNORMAL HIGH (ref 22–32)
Calcium: 9.3 mg/dL (ref 8.9–10.3)
Chloride: 88 mmol/L — ABNORMAL LOW (ref 98–111)
Creatinine, Ser: 1.04 mg/dL — ABNORMAL HIGH (ref 0.44–1.00)
GFR calc Af Amer: >60 mL/min (ref >60)
GFR calc non Af Amer: 60 mL/min — ABNORMAL LOW (ref >60)
Glucose, Bld: 161 mg/dL — ABNORMAL HIGH (ref 70–99)
Potassium: 3.6 mmol/L (ref 3.5–5.1)
Sodium: 133 mmol/L — ABNORMAL LOW (ref 135–145)
Total Bilirubin: 0.5 mg/dL (ref 0.3–1.2)
Total Protein: 7.7 g/dL (ref 6.5–8.1)

## 2019-12-16 MED ORDER — ORAL CARE MOUTH RINSE
15.0000 mL | Freq: Two times a day (BID) | OROMUCOSAL | Status: DC
  Administered 2019-12-16 – 2020-01-08 (×40): 15 mL via OROMUCOSAL

## 2019-12-16 MED ORDER — CHLORHEXIDINE GLUCONATE 0.12 % MT SOLN
15.0000 mL | Freq: Two times a day (BID) | OROMUCOSAL | Status: DC
  Administered 2019-12-16 – 2020-01-08 (×45): 15 mL via OROMUCOSAL
  Filled 2019-12-16 (×42): qty 15

## 2019-12-16 MED ORDER — QUETIAPINE FUMARATE 100 MG PO TABS
400.0000 mg | ORAL_TABLET | Freq: Every day | ORAL | Status: DC
  Administered 2019-12-16 – 2019-12-17 (×2): 400 mg
  Filled 2019-12-16 (×2): qty 4

## 2019-12-16 MED ORDER — QUETIAPINE FUMARATE 100 MG PO TABS
200.0000 mg | ORAL_TABLET | Freq: Every day | ORAL | Status: DC
  Administered 2019-12-17 – 2019-12-22 (×6): 200 mg
  Filled 2019-12-16 (×6): qty 2

## 2019-12-16 NOTE — Progress Notes (Signed)
  Speech Language Pathology Treatment: Dysphagia;Passy Muir Speaking valve  Patient Details Name: Susan [name] MRN: [id number] DOB: 1960/12/07 Today's Date: 12/16/2019 Time: [id number] SLP Time Calculation (min) (ACUTE ONLY): 23 min  Assessment / Plan / Recommendation Clinical Impression  Pt seen for skilled therapy in regards to facilitation of upper airway/phonation/secretion management with PMV and po trials to determine if/when appropriate for instrumental testing. Alert and calm in bed, smiling and mouthing words and able to phonate 1-2 seconds however when PMV donned quality and intensity of phonation adequate. Spoke mostly in phrases with evident expressive aphasia marked by phonemic paraphasia and neologisms mixed with intentional appropriate word and context. She was able to express she wanted coffee and "I am hungry". Increased wheezing and pharyngeal congestion present as session progressed but no evidence of air trapping. She did not respond to requests and demonstration to cough/clear throat to clear (aphasia may impacted).   Discoordinated oral acceptance of teaspoon trials water with lingual protrusion. Suspected delayed swallow initiation with water and delayed throat clearing and coughing due to likely airway encroachment. She has made improvements in attention ability therefore she is appropriate for instrumental assessment and would tolerate an MBS (over a FEES). Therapist will plan for MBS tomorrow and check on pt status tomorrow morning.    HPI HPI: 59 year old female smoker admitted 3/10 with overdose complicated by aspiration PNA with MRSA,  Pneumococcal bacteremia, toxic metabolic encephalopathy and septic shock. MRI 3/23 revealed Acute nonhemorrhagic infarcts in the posterior MCA territory bilaterally. Pt recieved PEG and has not been appropriate for BSE until present.        SLP Plan  Continue with current plan of care;Other (Comment)(MBS likely tomorrow)        Recommendations  Diet recommendations: NPO Medication Administration: Via alternative means      Patient may use Passy-Muir Speech Valve: During all therapies with supervision PMSV Supervision: Full         Oral Care Recommendations: Oral care QID Follow up Recommendations: Skilled Nursing facility SLP Visit Diagnosis: Dysphagia, unspecified (R13.10);Aphonia (R49.1) Plan: Continue with current plan of care;Other (Comment)(MBS likely tomorrow)                       [name] 12/16/2019, 11:25 AM   [name] Caroli.Ed Risk analyst [phone] Office [phone]

## 2019-12-16 NOTE — Plan of Care (Signed)
  Problem: Clinical Measurements: Goal: Diagnostic test results will improve Outcome: Progressing   Problem: Safety: Goal: Ability to remain free from injury will improve Outcome: Progressing   Problem: Clinical Measurements: Goal: Diagnostic test results will improve Outcome: Progressing

## 2019-12-16 NOTE — Progress Notes (Signed)
PROGRESS NOTE  Susan [name]  [id number] DOB: 04/26/1961 DOA: 11/11/2019 PCP: Hoyt Koch, MD   Brief Narrative: 59 year old female with medical history significant for HIV, diabetes with neuropathy, seizure disorder, tobacco abuse, bipolar disease, schizophrenia, history of IV drug abuse admitted 3/10 to ICU with overdose complicated by aspiration pneumonia with MRSA, pneumococcal bacteremia, toxic metabolic encephalopathy, septic shock and acute versus subacute bilateral MCA infarcts. Patient was intubated on 3/10, difficult to wean off, tracheostomy placed on 3/29. [facility] assumed care on 12/06/2019.59 year old female with medical history significant for HIV, diabetes with neuropathy, seizure disorder, tobacco abuse, bipolar disease, schizophrenia, history of IV drug abuse admitted 3/10 to ICU with overdose complicated by aspiration pneumonia with MRSA, pneumococcal bacteremia, toxic metabolic encephalopathy, septic shock and acute versus subacute bilateral MCA infarcts. Patient was intubated on 3/10, difficult to wean off, tracheostomy placed on 3/29. [facility] assumed care on 12/06/2019.  Assessment & Plan: Principal Problem:   Acute respiratory failure with hypoxemia (HCC) Active Problems:   HIV disease ([place])   Drug-induced hypotension   DNR (do not resuscitate) discussion   AKI (acute kidney injury) ([place])   Pneumococcal bacteremia   Pneumonia   Aortic valve endocarditis   Acute kidney injury (AKI) with acute tubular necrosis (ATN) (HCC)   Acute metabolic encephalopathy   Cerebral embolism with cerebral infarction   Status post tracheostomy [facility])   Palliative care by specialist   Adult failure to thrive  Acute hypoxic/hypercapnic respiratory failure with ARDS from MRSA PNA and aspiration pneumonia Hx of asthma Intubated on 3/10, difficult to wean off, status post trach on 3/29 Currently on trach collar with 5 L of O2 Continue Pulmicort nebulizer,  DuoNeb as needed Continue with VAP prevention order set PCCM had been following for trach care. Changed to cuffless trach 4/12  Aerococcus bacteremia 3/25 Klebsiella bronchitis/pneumonia 3/25 Seems stable Pt had completed 7-day course cefepime as of 4/1  Acute bilateral large bilateral posterior MCA infarcts - 3/23with severe encephalopathy TEE 3/16 with no sign of endocarditis  MRI brain c/w b/l posterior MCA territory infarcts; MRA/ MRV neg Continue aspirin, Crestor PT/OT consulted and had been following, disposition continues to be complicated by patient needing restraints for safety as she tries to climb out of bed  Acute metabolic encephalopathy/hospital delirium Multifactorial, including sepsis, hypoxia, with underlying history of schizophrenia/seizures, anoxic brain injury Persistent agitation as mentioned above Continue current regimen:Cogentin, BuSpar, doxepin, Neurontin, Zoloft, Seroquel - Increase HQ antipsychotic in effort to restore day-night cycle, Tx delirium. Will not automatically renew the restraints order for tonight, though this may be needed.   Acute systolic heart failure with sepsis induced CM Hx of HTN, HLD Currently on clonidine, amlodipine, maxide, metoprolol ACE inhibitor on hold Continue with Crestor as tolerated  DM type II poorly controlled HbA1C 12.3 from 11/11/19 SSI, lantus, hypoglycemic protocol, continue Accu-Cheks  HIV Continue home Descovy, Tivicay as tolerated  At risk malnutrition S/p PEG placement on 4/5, by IR Continue with tube feeds as tolerated  Obesity Lifestyle modification advised once able to participate in conversation  [place] Very poor prognosis due to multiple comorbidities Palliative consulted and had been following  DVT prophylaxis: SCD's Code Status: Full Family Communication: Pt in room, family not at bedside Status is: Inpatient  Remains inpatient appropriate because:Unsafe d/c plan as pt still requires  restraints for her own safety  Dispo: The patient is from: Home  Anticipated d/c is to: SNF  Anticipated d/c date is: > 3 days  Patient currently  is not medically stable to d/c.  Consultants:   PCCM  ID  Neurology  Procedures:   Mechanical ventilation  TEE  Trach changed to cuffless 4/12  Subjective: Does not confirm any pain or shortness of breath or other complaint, not oriented and has an abnormal behavioral pattern. Does happily cooperate with exam.  Objective: Vitals:   12/16/19 1510 12/16/19 1535 12/16/19 1628 12/16/19 1947  BP: (!) 118/57  (!) 120/59 (!) (P) 122/58  Pulse:  82 75 (P) 78  Resp: 17  19 (P) 20  Temp:   98.4 F (36.9 C) (P) 98.6 F (37 C)  TempSrc:   Axillary (P) Oral  SpO2:  98% 99% (P) 100%  Weight:      Height:        Intake/Output Summary (Last 24 hours) at 12/16/2019 2020 Last data filed at 12/16/2019 1812 Gross per 24 hour  Intake 2787.49 ml  Output 400 ml  Net 2387.49 ml   Filed Weights   12/14/19 0500 12/15/19 0334 12/16/19 0355  Weight: 92.9 kg 92.2 kg 90.4 kg    Gen: 59 y.o. female in no distress, soft restraints in place, no overt agitation but needing frequent redirection. Pulm: Non-labored breathing. Clear to auscultation bilaterally.  CV: Regular rate and rhythm. No murmur, rub, or gallop. No JVD, no pedal edema. GI: Abdomen soft, non-tender, non-distended, with normoactive bowel sounds. No organomegaly or masses felt. Ext: Warm, no deformities Skin: No rashes, lesions or ulcers on visualized skin Neuro: Alert and disoriented. Psych: Judgement and insight appear impaired. Mood & affect appropriate.   Data Reviewed: I have personally reviewed following labs and imaging studies  CBC: Recent Labs  Lab 12/12/19 0655 12/13/19 0500 12/14/19 0603 12/15/19 0636 12/16/19 0657  WBC 11.2* 9.7 10.1 12.7* 11.1*  NEUTROABS 6.6 4.9 5.2 8.3* 6.8  HGB 11.9* 11.7* 11.6* 11.8* 11.3*  HCT  38.0 37.8 37.7 37.8 36.5  MCV 89.4 90.4 91.3 90.0 88.8  PLT 335 347 311 295 [id number]   Basic Metabolic Panel: Recent Labs  Lab 12/10/19 0500 12/11/19 0304 12/12/19 0655 12/14/19 0603 12/16/19 0657  NA 138 138 136 136 133*  K 3.7 3.9 3.6 4.1 3.6  CL 95* 95* 93* 91* 88*  CO2 34* 31 33* 31 33*  GLUCOSE 147* 61* 169* 112* 161*  BUN 25* 29* 26* 39* 38*  CREATININE 0.86 0.86 0.94 1.04* 1.04*  CALCIUM 9.1 9.4 9.3 9.4 9.3   GFR: Estimated Creatinine Clearance: 63.7 mL/min (A) (by C-G formula based on SCr of 1.04 mg/dL (H)). Liver Function Tests: Recent Labs  Lab 12/12/19 0655 12/14/19 0603 12/16/19 0657  AST 31 24 23   ALT 23 23 20   ALKPHOS 125 107 110  BILITOT 0.5 0.2* 0.5  PROT 8.0 7.6 7.7  ALBUMIN 2.4* 2.5* 2.3*   No results for input(s): LIPASE, AMYLASE in the last 168 hours. No results for input(s): AMMONIA in the last 168 hours. Coagulation Profile: No results for input(s): INR, PROTIME in the last 168 hours. Cardiac Enzymes: No results for input(s): CKTOTAL, CKMB, CKMBINDEX, TROPONINI in the last 168 hours. BNP (last 3 results) No results for input(s): PROBNP in the last 8760 hours. HbA1C: No results for input(s): HGBA1C in the last 72 hours. CBG: Recent Labs  Lab 12/16/19 0352 12/16/19 0819 12/16/19 1222 12/16/19 1627 12/16/19 1937  GLUCAP 172* 110* 133* 102* 145*   Lipid Profile: No results for input(s): CHOL, HDL, LDLCALC, TRIG, CHOLHDL, LDLDIRECT in the last 72 hours. Thyroid Function Tests: No  results for input(s): TSH, T4TOTAL, FREET4, T3FREE, THYROIDAB in the last 72 hours. Anemia Panel: No results for input(s): VITAMINB12, FOLATE, FERRITIN, TIBC, IRON, RETICCTPCT in the last 72 hours. Urine analysis:    Component Value Date/Time   COLORURINE YELLOW 11/26/2019 1522   APPEARANCEUR CLEAR 11/26/2019 1522   LABSPEC 1.011 11/26/2019 1522   PHURINE 8.0 11/26/2019 1522   GLUCOSEU NEGATIVE 11/26/2019 1522   HGBUR NEGATIVE 11/26/2019 1522   BILIRUBINUR  NEGATIVE 11/26/2019 1522   KETONESUR NEGATIVE 11/26/2019 1522   PROTEINUR 30 (A) 11/26/2019 1522   UROBILINOGEN 1.0 06/01/2015 0104   NITRITE NEGATIVE 11/26/2019 1522   LEUKOCYTESUR NEGATIVE 11/26/2019 1522   No results found for this or any previous visit (from the past 240 hour(s)).    Radiology Studies: No results found.  Scheduled Meds: . amLODipine  10 mg Per Tube Daily  . aspirin  81 mg Per Tube Daily  . benztropine  0.5 mg Per Tube Daily  . budesonide (PULMICORT) nebulizer solution  0.5 mg Nebulization BID  . busPIRone  15 mg Per Tube TID  . chlorhexidine  15 mL Mouth Rinse BID  . Chlorhexidine Gluconate Cloth  6 each Topical Daily  . cloNIDine  0.2 mg Per Tube TID  . dolutegravir  50 mg Oral Daily  . doxepin  50 mg Per Tube QHS  . emtricitabine-tenofovir AF  1 tablet Per Tube Daily  . feeding supplement (PRO-STAT SUGAR FREE 64)  30 mL Per Tube TID  . free water  200 mL Per Tube Q4H  . gabapentin  300 mg Per Tube Q8H  . insulin aspart  0-20 Units Subcutaneous Q4H  . insulin aspart  5 Units Subcutaneous Q4H  . insulin glargine  10 Units Subcutaneous BID  . mouth rinse  15 mL Mouth Rinse q12n4p  . metoprolol tartrate  12.5 mg Per Tube BID  . pantoprazole sodium  40 mg Per Tube Daily  . polyethylene glycol  17 g Oral Daily  . QUEtiapine  200 mg Per Tube BID  . rosuvastatin  20 mg Per Tube q1800  . sennosides  15 mL Per Tube BID  . sertraline  50 mg Per Tube Daily  . triamterene-hydrochlorothiazide  1 tablet Per Tube Daily   Continuous Infusions: . sodium chloride Stopped (12/07/19 0216)  . feeding supplement (JEVITY 1.2 CAL) 50 mL/hr at 12/16/19 1812     LOS: 35 days   Time spent: 25 minutes.  Patrecia Pour, MD [facility] [email or web address] 12/16/2019, 8:20 PM

## 2019-12-16 NOTE — Consult Note (Signed)
   [facility] CM Inpatient Consult   12/16/2019  YUMALAY CIRCLE 03/06/1961 [id number]   Follow up: LOS and transition of care  [facility]  Chart reviewed for disposition follow up and plan is still for a skilled nursing facility [SNF] level of care per therapy recommendations.  Patient with trach and PEG.  Plan:  No [facility] Care Management follow up for transition to SNF. Will sign off at transition, as needs will be met at the SNF level when medically ready to transition.  Natividad Brood, RN BSN [facility] Liaison  [phone] business mobile phone Toll free office [phone]  Fax number: [phone] [email or web address]

## 2019-12-17 ENCOUNTER — Inpatient Hospital Stay (HOSPITAL_COMMUNITY): Payer: Medicare Other

## 2019-12-17 DIAGNOSIS — N170 Acute kidney failure with tubular necrosis: Secondary | ICD-10-CM | POA: Diagnosis not present

## 2019-12-17 DIAGNOSIS — G9341 Metabolic encephalopathy: Secondary | ICD-10-CM | POA: Diagnosis not present

## 2019-12-17 DIAGNOSIS — J9601 Acute respiratory failure with hypoxia: Secondary | ICD-10-CM | POA: Diagnosis not present

## 2019-12-17 DIAGNOSIS — R627 Adult failure to thrive: Secondary | ICD-10-CM | POA: Diagnosis not present

## 2019-12-17 LAB — CBC WITH DIFFERENTIAL/PLATELET
Abs Immature Granulocytes: 0.03 10*3/uL (ref 0.00–0.07)
Basophils Absolute: 0.1 10*3/uL (ref 0.0–0.1)
Basophils Relative: 0 %
Eosinophils Absolute: 0.7 10*3/uL — ABNORMAL HIGH (ref 0.0–0.5)
Eosinophils Relative: 6 %
HCT: 36.3 % (ref 36.0–46.0)
Hemoglobin: 11.2 g/dL — ABNORMAL LOW (ref 12.0–15.0)
Immature Granulocytes: 0 %
Lymphocytes Relative: 25 %
Lymphs Abs: 2.8 10*3/uL (ref 0.7–4.0)
MCH: 27.8 pg (ref 26.0–34.0)
MCHC: 30.9 g/dL (ref 30.0–36.0)
MCV: 90.1 fL (ref 80.0–100.0)
Monocytes Absolute: 1 10*3/uL (ref 0.1–1.0)
Monocytes Relative: 9 %
Neutro Abs: 6.6 10*3/uL (ref 1.7–7.7)
Neutrophils Relative %: 60 %
Platelets: 241 10*3/uL (ref 150–400)
RBC: 4.03 MIL/uL (ref 3.87–5.11)
RDW: 18.2 % — ABNORMAL HIGH (ref 11.5–15.5)
WBC: 11.2 10*3/uL — ABNORMAL HIGH (ref 4.0–10.5)
nRBC: 0 % (ref 0.0–0.2)

## 2019-12-17 LAB — GLUCOSE, CAPILLARY
Glucose-Capillary: 123 mg/dL — ABNORMAL HIGH (ref 70–99)
Glucose-Capillary: 129 mg/dL — ABNORMAL HIGH (ref 70–99)
Glucose-Capillary: 145 mg/dL — ABNORMAL HIGH (ref 70–99)
Glucose-Capillary: 157 mg/dL — ABNORMAL HIGH (ref 70–99)
Glucose-Capillary: 171 mg/dL — ABNORMAL HIGH (ref 70–99)
Glucose-Capillary: 81 mg/dL (ref 70–99)

## 2019-12-17 MED ORDER — ENOXAPARIN SODIUM 40 MG/0.4ML ~~LOC~~ SOLN
40.0000 mg | SUBCUTANEOUS | Status: DC
  Administered 2019-12-17 – 2020-01-08 (×23): 40 mg via SUBCUTANEOUS
  Filled 2019-12-17 (×23): qty 0.4

## 2019-12-17 NOTE — Progress Notes (Signed)
Modified Barium Swallow Progress Note  Patient Details  Name: Susan [name] MRN: [id number] Date of Birth: [birth year]  Today's Date: 12/17/2019  Modified Barium Swallow completed.  Full report located under Chart Review in the Imaging Section.  Brief recommendations include the following:  Clinical Impression  Pt's oropharyngeal swallow ability during MBS was limited due to pt's cooperation. Pt's PMV donned during studys. She refused to consume barium at outset however was receptive to cues and encouragement from one of the xray students with trials thin and nectar barium. Oral phase with limited textures (thin and nectar) was generally within functional limits although unable to assess solid textures. Timing of epiglottic closure was delayed resulting in penetration to the cords with thin and incomplete clearance of her laryngeal vestibule. Strength of swallow mechanisms was adequate and timing and coordination improved with nectar thick barium consistently. Pt is very impulsive and needs assist to attend to all tasks safely and effectively. Recommended that pt continue PEG feedings for nutrition presently. ST plans to incorporate puree textures and nectar liquids into dysphagia treatment and diagnostically treat and assess how pt is tolerating from swallow and cognitive standpoint and when appropriate and safe can initiate puree/nectar with all meals.          Swallow Evaluation Recommendations       SLP Diet Recommendations: NPO -continue PEG  Feeds for now. (ST will work with pt in therapy with trials nectar and puree and will recommend pt initiate when safe and appropriate)                      Oral Care Recommendations: Oral care QID        [name] 12/17/2019,3:20 PM   [name] Caroli.Ed Risk analyst [phone] Office [phone]

## 2019-12-17 NOTE — Progress Notes (Signed)
Called and spoke to pt daughter  Jameerah Beber about pt. Progress and I recommended that the MD will speak to her for further details. She was glad to know that her mom has improved compared to her admission and she will relate the message to her brother who stopped by earlier today.

## 2019-12-17 NOTE — Progress Notes (Signed)
Tele sitter ordered. Unable to release hand restraints. Pt grabs tubes and holds them tight with her hands. Pt leg restraints released. Pt legs over bed multiple times.

## 2019-12-17 NOTE — Progress Notes (Signed)
  Speech Language Pathology  Patient Details Name: Susan [name] MRN: [id number] DOB: 08-Oct-1960 Today's Date: 12/17/2019 Time:  -       MBS is scheduled for today at 9:30   [name] 12/17/2019, 7:58 AM  [name] Caroli.Ed Risk analyst [phone] Office [phone]

## 2019-12-17 NOTE — Progress Notes (Addendum)
NAME:  Susan [name], MRN:  [id number], DOB:  [birth year], LOS: 29 ADMISSION DATE:  11/11/2019, CONSULTATION DATE:  3/10 REFERRING MD:  Sabra Heck, CHIEF COMPLAINT:  Acute encephalopathy and respiratory failure    Brief History   59 year old female smoker admitted 3/10 with overdose complicated by aspiration PNA with MRSA,  Pneumococcal bacteremia, toxic metabolic encephalopathy and septic shock.    Past Medical History  Frequent falls at home,Tobacco abuse, diabetes with painful polyneuropathy, bipolar disease, GERD, HIV, schizophrenia, history of IV drug abuse, seizure disorder.   Significant Hospital Events   3/10 Admit with overdose, intubated on arrival, in shock   3/15 Ventilator asynchrony, heavy sedation & intermittent paralysis   3/16 Lasix drip.  Awaiting TEE.  Sputum w/ MRSA.   Coming back down on sedation, changed to [facility]. TEE showing NO EVIDENCE of endocarditis. ABX changed to pen G per ID. MRSA therapy complete. EF improved. Now up to 50% from 20-25% 3/17 Heavily sedated. Having episodes of bradycardia. Still massively overloaded (>14L), increasing lasix gtt to 8 mg from 4, adding metolazone.  3/19 Lasix to 40 every 12, changing RASS goal to -2, decrease in fentanyl, decreasing PEEP, anticipating trialing pressure support ventilation.  Watching upward trend of white blood cell count and low-grade fever 3/20 BM early am. Agitation early am > sedate post am PT psy meds 3/22 Decreased mentation and movement of extremities overnight, sent for Head CT which revealed acute vs subacute bilateral MCA infarcts  3/23 Lying in bed seen spontaneously moving lower and right upper extremity, unable to follow any commands.  3/24 Hypoglycemic episodes overnight, Weaning well 10/5 today, tmax 101.5 Remains on precedex gtt 1.2 3/29 tracheostomy placed 4/12 #6 shiley cuffed exchanged for #6 cuffless.  4/14 PMV with SLP 4/15 Plan for MBS  Consults:  ID Cards for TEE Neurology  Palliative  care  Procedures:  ETT 3/10 >>  R Fem TLC (ER) 3/10  R IJ TLC 3/10  L IJ TLC 3/10 >> 3/17  RUE PICC 3/16 >>  Trach 3/29 >>  PEG 4/05 >>   Significant Diagnostic Tests:   CT brain 3/10 >> Normal head CT  EEG 3/10 >> Moderate to severe diffuse encephalopathy. No seizures  Urine drug screen 3/10 >> positive for THC, acetaminophen Q000111Q, salicylate <7, ETOH Q000111Q  ECHO 3/11 >> EF 20-25%, LV function severely diminished, Global Hypokinesis,LV hypertrophy, Grade 1 Diastolic Dysfunction, Elevated PASP, Dilated LA, MV regurgitation, small mobile density right coronary cusp  TEE 3/16 >> LVEF 55%, No LA/LAA thrombus or masses, Previously noted mass on the aortic valve is a node of Arancius.  Normal variant and no evidence of endocarditis.Trivial TR and trivial PR 3/23 MRI brain >>Acute nonhemorrhagic infarcts in the posterior MCA territory bilaterally. Otherwise no evidence of chronic ischemia. 3/23 MRA head >>No significant intracranial stenosis.  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 3/23 MRV head >> negative  Micro Data:  Influenza A/B 3/10 >> negative  COVID 3/10 >> negative  MRSA PCR 3/10 >> positive  BCx2 3/10 >> streptococcus pneumoniae >> S-rocephin BCx1 (aerobic only) 3/10 >> negative  Tracheal aspirate 3/11 >> MRSA BCx2 3/12 >> negative  BCx2 3/25 >> aerococcus  Resp cx 3/25 >> Klebsiella (R amp)   Antimicrobials:  Vancomycin 3/10 >> 3/16 Unasyn 3/10 >> 3/16 Pen G 3/16 >> 3/23 Cefepime 3/26 >> 3/31  Interim history/subjective:  Phonating sentence fragments around #6 cuffless trach Somewhat aggravated this morning and refused PMV Remains very stable on 28%  FiO2 5L/min flow  Objective   BP 120/68   Pulse 89   Temp 98.5 F (36.9 C) (Oral)   Resp (!) 23   Ht 5\' 3"  (1.6 m)   Wt 90.4 kg   LMP  (LMP Unknown)   SpO2 97%   BMI 35.30 kg/m   I/O last 3 completed shifts: In: 2847.5 [Other:160; NG/GT:2687.5] Out: 1150  [Urine:1150]  Examination:  General:  Chronically ill appearing middle aged F, reclined in bed NAD  HEENT: NCAT. #6 cuffless shiley in place. Site without active bleeding. Tan secretions suctioned from trach. Fragmented phonation with tracheostomy  Neuro: Awake, following commands. Attempting to speak in phrases, expressive aphasia with some appropriate word utilization CV: RRR s1s2 no rgm  PULM:  Scattered rhonchi most notable RUL. Symmetrical chest expansion without accessory muscle use.  GI: round, soft, ndnt. PEG  Extremities: BUE restraints + LUE mitten. BLE ankle restraints are loose.  Skin: c/d/w without rash  Psych: fluctuating mood-- euthymic with congruent effect to aggravated with thrashing psychomotor movements   Resolved Hospital Problem list   Rhabdomyolysis, Elevated INR, Septic shock, MRSA and aspiration pneumonia, and Pneumococcal bacteremia, Aerococcus bacteremia, Klebsiella HCAP ARDS from MRSA PNA Aspiration PNA  Aerococcus bacteremia Klebsiella PNA   Assessment & Plan:   Acute respiratory failure requiring intubation, now s/p tracheostomy  -Improved hypoxic/hypercapnic respiratory failure with ARDS from MRSA PNA and aspiration pneumonia. Hx of asthma. - Currently #6 cuffless, routine trach care  - Deferring downsizing due to recent mucus plugging, continued thick secretions  - Cont aggressive pulm hygiene  - goal SpO2 > 92% - Continue pulmicort + PRN duonebs  - Did well with PMV with SLP 4/14, going for MBS 4/15 -Cont PMV efforts and move toward capping trials as patient tolerates   Acute bilateral large bilateral posterior MCA infarcts - PT/OT/SLP - Supportive care   Hx seizures, hx anoxic injury-supportive care  Schizophrenia, acute encephalpathy- seroquel, zoloft, buspar, cogentin, doxepin, gabapentin, PRN restraints HIV- tivicay, descovy  Acute systolic heart failure with sepsis induced CM - s/p TEE and without signs of endocarditis  HTN, HLD -  norvasc, clonidine, metop, hydral, crestor. ACEi on hold  DM II, poorly controlled A1c 12.3 - SSI + basal  Obesity, At risk malnutrition- s/p PEG 4/5.  P - above per primary team   PCCM will continue to follow 1-2 times for trach care. Please engage if needed in interim.   Labs:   CMP Latest Ref Rng & Units 12/16/2019 12/14/2019 12/12/2019  Glucose 70 - 99 mg/dL 161(H) 112(H) 169(H)  BUN 6 - 20 mg/dL 38(H) 39(H) 26(H)  Creatinine 0.44 - 1.00 mg/dL 1.04(H) 1.04(H) 0.94  Sodium 135 - 145 mmol/L 133(L) 136 136  Potassium 3.5 - 5.1 mmol/L 3.6 4.1 3.6  Chloride 98 - 111 mmol/L 88(L) 91(L) 93(L)  CO2 22 - 32 mmol/L 33(H) 31 33(H)  Calcium 8.9 - 10.3 mg/dL 9.3 9.4 9.3  Total Protein 6.5 - 8.1 g/dL 7.7 7.6 8.0  Total Bilirubin 0.3 - 1.2 mg/dL 0.5 0.2(L) 0.5  Alkaline Phos 38 - 126 U/L 110 107 125  AST 15 - 41 U/L 23 24 31   ALT 0 - 44 U/L 20 23 23     CBC Latest Ref Rng & Units 12/16/2019 12/15/2019 12/14/2019  WBC 4.0 - 10.5 K/uL 11.1(H) 12.7(H) 10.1  Hemoglobin 12.0 - 15.0 g/dL 11.3(L) 11.8(L) 11.6(L)  Hematocrit 36.0 - 46.0 % 36.5 37.8 37.7  Platelets 150 - 400 K/uL 266 295 311  ABG    Component Value Date/Time   PHART 7.487 (H) 12/05/2019 0352   PCO2ART 51.0 (H) 12/05/2019 0352   PO2ART 52.0 (L) 12/05/2019 0352   HCO3 38.7 (H) 12/05/2019 0352   TCO2 40 (H) 12/05/2019 0352   ACIDBASEDEF 4.0 (H) 11/12/2019 1504   O2SAT 89.0 12/05/2019 0352    CBG (last 3)  Recent Labs    12/16/19 2334 12/17/19 0408 12/17/19 [place] MSN, AGACNP-BC Vivian [id number] If no answer, [id number] 12/17/2019, 8:20 AM

## 2019-12-17 NOTE — Progress Notes (Signed)
PROGRESS NOTE  Susan [name]  [id number] DOB: 1960/11/01 DOA: 11/11/2019 PCP: Hoyt Koch, MD   Brief Narrative: Susan [name] is a 59 year old female with a history of HIV, diabetes with neuropathy, seizure disorder, tobacco use, bipolar disease, schizophrenia, history of IV drug abuse admitted 3/10 to ICU with overdose in shock with aspiration pneumonia with MRSA, pneumococcal bacteremia, toxic metabolic encephalopathy, septic shock and acute versus subacute bilateral MCA infarcts. Patient was intubated on 3/10, difficult to wean off, tracheostomy placed on 3/29. Was treated for MRSA pneumonia and massive volume overload with lasix infusion. Decreased movement and mentation was noted and head CT on 3/22 showed bilateral acute vs. subacute MCA infarcts confirmed on MRI 3/23. [facility] assumed care on 12/06/2019. Disposition is complicated by persistent encephalopathy requiring restraints to protect life-sustaining medical devices including trach and PEG.  Assessment & Plan: Principal Problem:   Acute respiratory failure with hypoxemia (HCC) Active Problems:   HIV disease ([place])   Drug-induced hypotension   DNR (do not resuscitate) discussion   AKI (acute kidney injury) ([place])   Pneumococcal bacteremia   Pneumonia   Aortic valve endocarditis   Acute kidney injury (AKI) with acute tubular necrosis (ATN) (HCC)   Acute metabolic encephalopathy   Cerebral embolism with cerebral infarction   Status post tracheostomy [facility])   Palliative care by specialist   Adult failure to thrive  Acute hypoxic/hypercapnic respiratory failure with ARDS from MRSA pneumonia and aspiration pneumonia, history of asthma: s/p ETT 3/10, tracheostomy 3/29. - PCCM following for tracheostomy care, currently #6 cuffless (since 4/12) undergoing capping trials/tolerating PMV.  - Scheduled and prn BDs  Aerococcus bacteremia 3/25 Klebsiella bronchitis/pneumonia 3/25 - s/p cefepime x7 days on 4/1   Acute large bilateral posterior MCA infarcts 3/23: TEE 3/16 without CES. MRI brain c/w b/l posterior MCA territory infarcts; MRA/ MRV neg - Continue ASA, statin.  - Needs continued PT/OT  Acute metabolic encephalopathy/hospital delirium: Large CVAs causing brain injury and complicated by preexisting schizophrenia and bipolar disorder.  - Continue home medications including seroquel, cogentin, zoloft, buspar, doxepin, neurontin. Increased HS dose of seroquel to attempt to improve agitation. Continues to require soft restraints to preserve trach as the patient's impulsivity continues and she's shown a penchant toward pulling at trach.   Acute systolic heart failure with sepsis induced CM, HTN: No longer requiring regular diuresis.  - Currently on clonidine, amlodipine, maxzide, metoprolol  HLD:  Continue with Crestor as tolerated  Poorly-controlled T2DM: HbA1c 12.3%.  - Continue basal-bolus insulin/CBGs.   HIV - Continue ART  At risk malnutrition: S/p PEG placement on 4/5, by IR - Continue with tube feeds as tolerated  Obesity: Body mass index is 35.3 kg/m.  - Lifestyle modification advised once able to participate in conversation  [place] Very poor prognosis due to multiple comorbidities Palliative consulted and had been following  DVT prophylaxis: SCD's Code Status: Full Family Communication: None at bedside Status is: Inpatient  Remains inpatient appropriate because:Unsafe d/c plan as pt still requires restraints for her own safety  Dispo: The patient is from: Home  Anticipated d/c is to: SNF  Anticipated d/c date is: > 3 days  Patient currently is not medically stable to d/c.  Consultants:   PCCM  ID  Neurology  Procedures:   Mechanical ventilation  TEE  Trach changed to cuffless 4/12  Subjective: No new issues per patient, denies any pain currently. Frustrated but doesn't want to use PMV to speak.   Objective:  Vitals:  12/17/19 0055 12/17/19 0410 12/17/19 0807 12/17/19 1132  BP:    114/68  Pulse: 80 86 89 82  Resp: 18 18 (!) 23 19  Temp:    98 F (36.7 C)  TempSrc:    Oral  SpO2: 97% 97% 97% 99%  Weight:      Height:        Intake/Output Summary (Last 24 hours) at 12/17/2019 1343 Last data filed at 12/17/2019 0626 Gross per 24 hour  Intake 1171.66 ml  Output 1150 ml  Net 21.66 ml   Filed Weights   12/14/19 0500 12/15/19 0334 12/16/19 0355  Weight: 92.9 kg 92.2 kg 90.4 kg   Gen: 59 y.o. female in no distress Pulm: Nonlabored breathing 5LPM thru trach. Scattered rhonchi without crackles/wheezing. CV: Regular rate and rhythm. No murmur, rub, or gallop. No JVD, no dependent edema. GI: Abdomen soft, non-tender, non-distended, with normoactive bowel sounds.  Ext: Warm, no deformities Skin: No new rashes, lesions or ulcers on visualized skin. PEG site appears without discharge/bleeding. Neuro: Alert, incompletely oriented, requires frequent redirection. Moves all extremities.  Psych: Judgement and insight appear impaired. Mood euthymic & affect congruent. Behavior is impulsive.    Data Reviewed: I have personally reviewed following labs and imaging studies  CBC: Recent Labs  Lab 12/13/19 0500 12/14/19 0603 12/15/19 0636 12/16/19 0657 12/17/19 0500  WBC 9.7 10.1 12.7* 11.1* 11.2*  NEUTROABS 4.9 5.2 8.3* 6.8 6.6  HGB 11.7* 11.6* 11.8* 11.3* 11.2*  HCT 37.8 37.7 37.8 36.5 36.3  MCV 90.4 91.3 90.0 88.8 90.1  PLT 347 311 295 266 [id number]   Basic Metabolic Panel: Recent Labs  Lab 12/11/19 0304 12/12/19 0655 12/14/19 0603 12/16/19 0657  NA 138 136 136 133*  K 3.9 3.6 4.1 3.6  CL 95* 93* 91* 88*  CO2 31 33* 31 33*  GLUCOSE 61* 169* 112* 161*  BUN 29* 26* 39* 38*  CREATININE 0.86 0.94 1.04* 1.04*  CALCIUM 9.4 9.3 9.4 9.3   GFR: Estimated Creatinine Clearance: 63.7 mL/min (A) (by C-G formula based on SCr of 1.04 mg/dL (H)). Liver Function Tests: Recent Labs  Lab 12/12/19  0655 12/14/19 0603 12/16/19 0657  AST 31 24 23   ALT 23 23 20   ALKPHOS 125 107 110  BILITOT 0.5 0.2* 0.5  PROT 8.0 7.6 7.7  ALBUMIN 2.4* 2.5* 2.3*   No results for input(s): LIPASE, AMYLASE in the last 168 hours. No results for input(s): AMMONIA in the last 168 hours. Coagulation Profile: No results for input(s): INR, PROTIME in the last 168 hours. Cardiac Enzymes: No results for input(s): CKTOTAL, CKMB, CKMBINDEX, TROPONINI in the last 168 hours. BNP (last 3 results) No results for input(s): PROBNP in the last 8760 hours. HbA1C: No results for input(s): HGBA1C in the last 72 hours. CBG: Recent Labs  Lab 12/16/19 1937 12/16/19 2334 12/17/19 0408 12/17/19 0744 12/17/19 1132  GLUCAP 145* 197* 171* 157* 81   Lipid Profile: No results for input(s): CHOL, HDL, LDLCALC, TRIG, CHOLHDL, LDLDIRECT in the last 72 hours. Thyroid Function Tests: No results for input(s): TSH, T4TOTAL, FREET4, T3FREE, THYROIDAB in the last 72 hours. Anemia Panel: No results for input(s): VITAMINB12, FOLATE, FERRITIN, TIBC, IRON, RETICCTPCT in the last 72 hours. Urine analysis:    Component Value Date/Time   COLORURINE YELLOW 11/26/2019 1522   APPEARANCEUR CLEAR 11/26/2019 1522   LABSPEC 1.011 11/26/2019 1522   PHURINE 8.0 11/26/2019 1522   GLUCOSEU NEGATIVE 11/26/2019 1522   HGBUR NEGATIVE 11/26/2019 1522   BILIRUBINUR NEGATIVE  11/26/2019 1522   [place] 11/26/2019 1522   PROTEINUR 30 (A) 11/26/2019 1522   UROBILINOGEN 1.0 06/01/2015 0104   NITRITE NEGATIVE 11/26/2019 1522   LEUKOCYTESUR NEGATIVE 11/26/2019 1522   No results found for this or any previous visit (from the past 240 hour(s)).    Radiology Studies: No results found.  Scheduled Meds: . amLODipine  10 mg Per Tube Daily  . aspirin  81 mg Per Tube Daily  . benztropine  0.5 mg Per Tube Daily  . budesonide (PULMICORT) nebulizer solution  0.5 mg Nebulization BID  . busPIRone  15 mg Per Tube TID  . chlorhexidine  15 mL  Mouth Rinse BID  . Chlorhexidine Gluconate Cloth  6 each Topical Daily  . cloNIDine  0.2 mg Per Tube TID  . dolutegravir  50 mg Oral Daily  . doxepin  50 mg Per Tube QHS  . emtricitabine-tenofovir AF  1 tablet Per Tube Daily  . feeding supplement (PRO-STAT SUGAR FREE 64)  30 mL Per Tube TID  . free water  200 mL Per Tube Q4H  . gabapentin  300 mg Per Tube Q8H  . insulin aspart  0-20 Units Subcutaneous Q4H  . insulin aspart  5 Units Subcutaneous Q4H  . insulin glargine  10 Units Subcutaneous BID  . mouth rinse  15 mL Mouth Rinse q12n4p  . metoprolol tartrate  12.5 mg Per Tube BID  . pantoprazole sodium  40 mg Per Tube Daily  . polyethylene glycol  17 g Oral Daily  . QUEtiapine  200 mg Per Tube Daily  . QUEtiapine  400 mg Per Tube QHS  . rosuvastatin  20 mg Per Tube q1800  . sennosides  15 mL Per Tube BID  . sertraline  50 mg Per Tube Daily  . triamterene-hydrochlorothiazide  1 tablet Per Tube Daily   Continuous Infusions: . sodium chloride Stopped (12/07/19 0216)  . feeding supplement (JEVITY 1.2 CAL) 50 mL/hr at 12/16/19 1812     LOS: 36 days   Time spent: 25 minutes.  Patrecia Pour, MD [facility] [email or web address] 12/17/2019, 1:43 PM

## 2019-12-18 DIAGNOSIS — N170 Acute kidney failure with tubular necrosis: Secondary | ICD-10-CM | POA: Diagnosis not present

## 2019-12-18 DIAGNOSIS — J9601 Acute respiratory failure with hypoxia: Secondary | ICD-10-CM | POA: Diagnosis not present

## 2019-12-18 DIAGNOSIS — R627 Adult failure to thrive: Secondary | ICD-10-CM | POA: Diagnosis not present

## 2019-12-18 DIAGNOSIS — G9341 Metabolic encephalopathy: Secondary | ICD-10-CM | POA: Diagnosis not present

## 2019-12-18 LAB — CBC WITH DIFFERENTIAL/PLATELET
Abs Immature Granulocytes: 0.05 10*3/uL (ref 0.00–0.07)
Basophils Absolute: 0.1 10*3/uL (ref 0.0–0.1)
Basophils Relative: 1 %
Eosinophils Absolute: 0.8 10*3/uL — ABNORMAL HIGH (ref 0.0–0.5)
Eosinophils Relative: 7 %
HCT: 36 % (ref 36.0–46.0)
Hemoglobin: 11.2 g/dL — ABNORMAL LOW (ref 12.0–15.0)
Immature Granulocytes: 1 %
Lymphocytes Relative: 25 %
Lymphs Abs: 2.7 10*3/uL (ref 0.7–4.0)
MCH: 27.9 pg (ref 26.0–34.0)
MCHC: 31.1 g/dL (ref 30.0–36.0)
MCV: 89.8 fL (ref 80.0–100.0)
Monocytes Absolute: 0.9 10*3/uL (ref 0.1–1.0)
Monocytes Relative: 8 %
Neutro Abs: 6.5 10*3/uL (ref 1.7–7.7)
Neutrophils Relative %: 58 %
Platelets: 260 10*3/uL (ref 150–400)
RBC: 4.01 MIL/uL (ref 3.87–5.11)
RDW: 17.9 % — ABNORMAL HIGH (ref 11.5–15.5)
WBC: 10.9 10*3/uL — ABNORMAL HIGH (ref 4.0–10.5)
nRBC: 0 % (ref 0.0–0.2)

## 2019-12-18 LAB — GLUCOSE, CAPILLARY
Glucose-Capillary: 102 mg/dL — ABNORMAL HIGH (ref 70–99)
Glucose-Capillary: 123 mg/dL — ABNORMAL HIGH (ref 70–99)
Glucose-Capillary: 151 mg/dL — ABNORMAL HIGH (ref 70–99)
Glucose-Capillary: 164 mg/dL — ABNORMAL HIGH (ref 70–99)
Glucose-Capillary: 225 mg/dL — ABNORMAL HIGH (ref 70–99)
Glucose-Capillary: 95 mg/dL (ref 70–99)

## 2019-12-18 MED ORDER — QUETIAPINE FUMARATE 100 MG PO TABS
500.0000 mg | ORAL_TABLET | Freq: Every day | ORAL | Status: DC
  Administered 2019-12-18 – 2019-12-21 (×4): 500 mg
  Filled 2019-12-18 (×3): qty 5
  Filled 2019-12-18: qty 10

## 2019-12-18 NOTE — Progress Notes (Signed)
Physical Therapy Treatment Patient Details Name: Susan [name] MRN: [id number] DOB: [birth year] Today's Date: 12/18/2019    History of Present Illness 59 year old female smoker admitted 3/10 with overdose complicated by aspiration PNA with MRSA,  Pneumococcal bacteremia, toxic metabolic encephalopathy and septic shock.  Frequent falls at home,Tobacco abuse, diabetes with painful polyneuropathy, bipolar disease, GERD, HIV, schizophrenia, history of IV drug abuse, seizure disorder. Intubated 3/10-3/29 3/23 MRI brain >>Acute nonhemorrhagic infarcts in the posterior MCA territory bilaterally. Tracheostomy placed 3/29    PT Comments    Pt in bed upon arrival of PT and NTs. The session was limited by poor command following today, as the pt did not respond to any commands given through today's session, but instead was perseverating on arrival of ambulance. The pt needed heavy maxA of 2 for all bed mobility, pericare, and repositioning, and will continue to benefit from skilled PT to further progress functional mobility and activity tolerance.     Follow Up Recommendations  SNF     Equipment Recommendations  None recommended by PT    Recommendations for Other Services       Precautions / Restrictions Precautions Precautions: Fall Precaution Comments: 4 point restraint. PEG. Trach collar 5L at 28%FiO2. Bilateral mitts.  Restrictions Weight Bearing Restrictions: No    Mobility  Bed Mobility Overal bed mobility: Needs Assistance Bed Mobility: Rolling Rolling: Max assist;+2 for physical assistance;+2 for safety/equipment         General bed mobility comments: Pt maxA of 2 for rolling with assist of +3 to complete pericare, pt was not assisting with any mobiltiy today despite multimodal cues/commands. total A of 2 to reposition in bed, pt actively trying to move BLE OOB.  Transfers                    Ambulation/Gait                 Stairs              Wheelchair Mobility    Modified Rankin (Stroke Patients Only)       Balance Overall balance assessment: Needs assistance                                          Cognition Arousal/Alertness: Awake/alert Behavior During Therapy: Flat affect;Impulsive;Restless(increased restlessness and agitation with bed mobility/pericare) Overall Cognitive Status: Impaired/Different from baseline Area of Impairment: Orientation;Attention;Memory;Following commands;Safety/judgement;Awareness;Problem solving                 Orientation Level: Disoriented to;Place;Time;Situation Current Attention Level: Focused Memory: Decreased short-term memory;Decreased recall of precautions Following Commands: Follows one step commands with increased time;Follows one step commands inconsistently Safety/Judgement: Decreased awareness of safety;Decreased awareness of deficits Awareness: Intellectual Problem Solving: Decreased initiation;Difficulty sequencing;Requires verbal cues;Requires tactile cues;Slow processing General Comments: Pt with incoherent speech today, perseverating on arrival of ambulance. No active command following throughout session. pt moving all extremities impusively, seemingly agitated with pericare      Exercises      General Comments        Pertinent Vitals/Pain Pain Assessment: No/denies pain Pain Intervention(s): Limited activity within patient's tolerance;Monitored during session    Home Living                      Prior Function            PT  Goals (current goals can now be found in the care plan section) Acute Rehab PT Goals Patient Stated Goal: to eat PT Goal Formulation: Patient unable to participate in goal setting Time For Goal Achievement: 12/22/19 Potential to Achieve Goals: Fair Progress towards PT goals: Progressing toward goals    Frequency    Min 3X/week      PT Plan Current plan remains appropriate     Co-evaluation              AM-PAC PT "6 Clicks" Mobility   Outcome Measure  Help needed turning from your back to your side while in a flat bed without using bedrails?: A Lot Help needed moving from lying on your back to sitting on the side of a flat bed without using bedrails?: A Lot Help needed moving to and from a bed to a chair (including a wheelchair)?: Total Help needed standing up from a chair using your arms (e.g., wheelchair or bedside chair)?: Total Help needed to walk in hospital room?: Total Help needed climbing 3-5 steps with a railing? : Total 6 Click Score: 8    End of Session Equipment Utilized During Treatment: Oxygen Activity Tolerance: Treatment limited secondary to agitation;Other (comment)(poor command following and impusivity) Patient left: in bed;with bed alarm set;with restraints reapplied(new mits as old mits were soiled) Nurse Communication: Mobility status PT Visit Diagnosis: Unsteadiness on feet (R26.81);Other abnormalities of gait and mobility (R26.89);Repeated falls (R29.6);Muscle weakness (generalized) (M62.81);History of falling (Z91.81);Difficulty in walking, not elsewhere classified (R26.2);Hemiplegia and hemiparesis Hemiplegia - Right/Left: Left Hemiplegia - dominant/non-dominant: Non-dominant Hemiplegia - caused by: Cerebral infarction     Time: [id number] PT Time Calculation (min) (ACUTE ONLY): 21 min  Charges:  $Therapeutic Activity: 8-22 mins                     Karma Ganja, PT, DPT   Acute Rehabilitation Department Pager #: [phone]   Otho Bellows 12/18/2019, 2:59 PM
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
## 2019-12-18 NOTE — Progress Notes (Signed)
  Speech Language Pathology Treatment: Dysphagia;Passy Muir Speaking valve  Patient Details Name: Susan [name] MRN: [id number] DOB: 12-25-1960 Today's Date: 12/18/2019 Time: [id number] SLP Time Calculation (min) (ACUTE ONLY): 23 min  Assessment / Plan / Recommendation Clinical Impression  Susan [name] had slid low in bed, with tele sitter and eager for therapist to come in. Tolerated repositioning, mouthing words and achieving intermittent vocalization over trach (air reaching vocal cords).  Expressive language is a mix of accurate/intended words and paraphasias. She is better able to make intent known today and accurate and fluent in short phrase/sentence. Upset and teary but therapist unable to decipher entire message.   Trials of po's with PMV donned for tolerance. Pt consumed approximately 3 bites pudding which pt stated "that is good" and 2 teaspoon sips nectar liquid before saying "no more". Vocal quality with continued mild wet quality. No cough or related throat clearing detected during trials. Suspect intake during trials or when po's initiated will be low unless pt could have tube feedings modified with RD (but are not to that point quite yet). Will continue.  Intensity is excellent with valve donned and congested respirations and wet vocal quality. Several reflexive throat clears non productive to clear audible mucous and unable to cough/throat clear on command with multiple requests and demonstration due to either poor comprehension or less likely apraxia. RR 20, HR 85 and SpO2 98% with adequate reciprocation of inhalation and exhalation while valve donned.       HPI HPI: 59 year old female smoker admitted 3/10 with overdose complicated by aspiration PNA with MRSA,  Pneumococcal bacteremia, toxic metabolic encephalopathy and septic shock. MRI 3/23 revealed Acute nonhemorrhagic infarcts in the posterior MCA territory bilaterally. Pt recieved PEG and has not been appropriate for BSE until  present.        SLP Plan  Continue with current plan of care;Other (Comment)       Recommendations  Diet recommendations: NPO;Other(comment)(trials with ST) Medication Administration: Via alternative means      Patient may use Passy-Muir Speech Valve: During all therapies with supervision PMSV Supervision: Full MD: Please consider changing trach tube to : Cuffless;Smaller size         Oral Care Recommendations: Oral care QID Follow up Recommendations: 24 hour supervision/assistance;Skilled Nursing facility SLP Visit Diagnosis: Dysphagia, pharyngeal phase (R13.13) Plan: Continue with current plan of care;Other (Comment)       GO                Susan [name] 12/18/2019, 10:15 AM

## 2019-12-18 NOTE — Progress Notes (Addendum)
Called pharmacy to inquire about Tivokay medication that is ordered PO, there is not a per tube option, but per pharmacy this can be given per tube.

## 2019-12-19 ENCOUNTER — Inpatient Hospital Stay (HOSPITAL_COMMUNITY): Payer: Medicare Other

## 2019-12-19 DIAGNOSIS — R627 Adult failure to thrive: Secondary | ICD-10-CM | POA: Diagnosis not present

## 2019-12-19 DIAGNOSIS — G9341 Metabolic encephalopathy: Secondary | ICD-10-CM | POA: Diagnosis not present

## 2019-12-19 DIAGNOSIS — N170 Acute kidney failure with tubular necrosis: Secondary | ICD-10-CM | POA: Diagnosis not present

## 2019-12-19 DIAGNOSIS — J9601 Acute respiratory failure with hypoxia: Secondary | ICD-10-CM | POA: Diagnosis not present

## 2019-12-19 LAB — CBC WITH DIFFERENTIAL/PLATELET
Abs Immature Granulocytes: 0.03 10*3/uL (ref 0.00–0.07)
Basophils Absolute: 0.1 10*3/uL (ref 0.0–0.1)
Basophils Relative: 1 %
Eosinophils Absolute: 0.8 10*3/uL — ABNORMAL HIGH (ref 0.0–0.5)
Eosinophils Relative: 8 %
HCT: 34.6 % — ABNORMAL LOW (ref 36.0–46.0)
Hemoglobin: 11 g/dL — ABNORMAL LOW (ref 12.0–15.0)
Immature Granulocytes: 0 %
Lymphocytes Relative: 25 %
Lymphs Abs: 2.7 10*3/uL (ref 0.7–4.0)
MCH: 28.2 pg (ref 26.0–34.0)
MCHC: 31.8 g/dL (ref 30.0–36.0)
MCV: 88.7 fL (ref 80.0–100.0)
Monocytes Absolute: 0.9 10*3/uL (ref 0.1–1.0)
Monocytes Relative: 8 %
Neutro Abs: 6.3 10*3/uL (ref 1.7–7.7)
Neutrophils Relative %: 58 %
Platelets: 253 10*3/uL (ref 150–400)
RBC: 3.9 MIL/uL (ref 3.87–5.11)
RDW: 17.6 % — ABNORMAL HIGH (ref 11.5–15.5)
WBC: 10.8 10*3/uL — ABNORMAL HIGH (ref 4.0–10.5)
nRBC: 0 % (ref 0.0–0.2)

## 2019-12-19 LAB — GLUCOSE, CAPILLARY
Glucose-Capillary: 171 mg/dL — ABNORMAL HIGH (ref 70–99)
Glucose-Capillary: 192 mg/dL — ABNORMAL HIGH (ref 70–99)
Glucose-Capillary: 194 mg/dL — ABNORMAL HIGH (ref 70–99)
Glucose-Capillary: 101 mg/dL — ABNORMAL HIGH (ref 70–99)
Glucose-Capillary: 126 mg/dL — ABNORMAL HIGH (ref 70–99)

## 2019-12-19 IMAGING — DX DG CHEST 1V PORT
1 series · 1 of 1 positions shown · non-contrast
Comparison: Chest x-rays dated [DATE]

CLINICAL DATA: Hypoxia, tachypnea and mucous plugging of bronchi.

EXAM:
PORTABLE CHEST 1 VIEW

[chest ap]
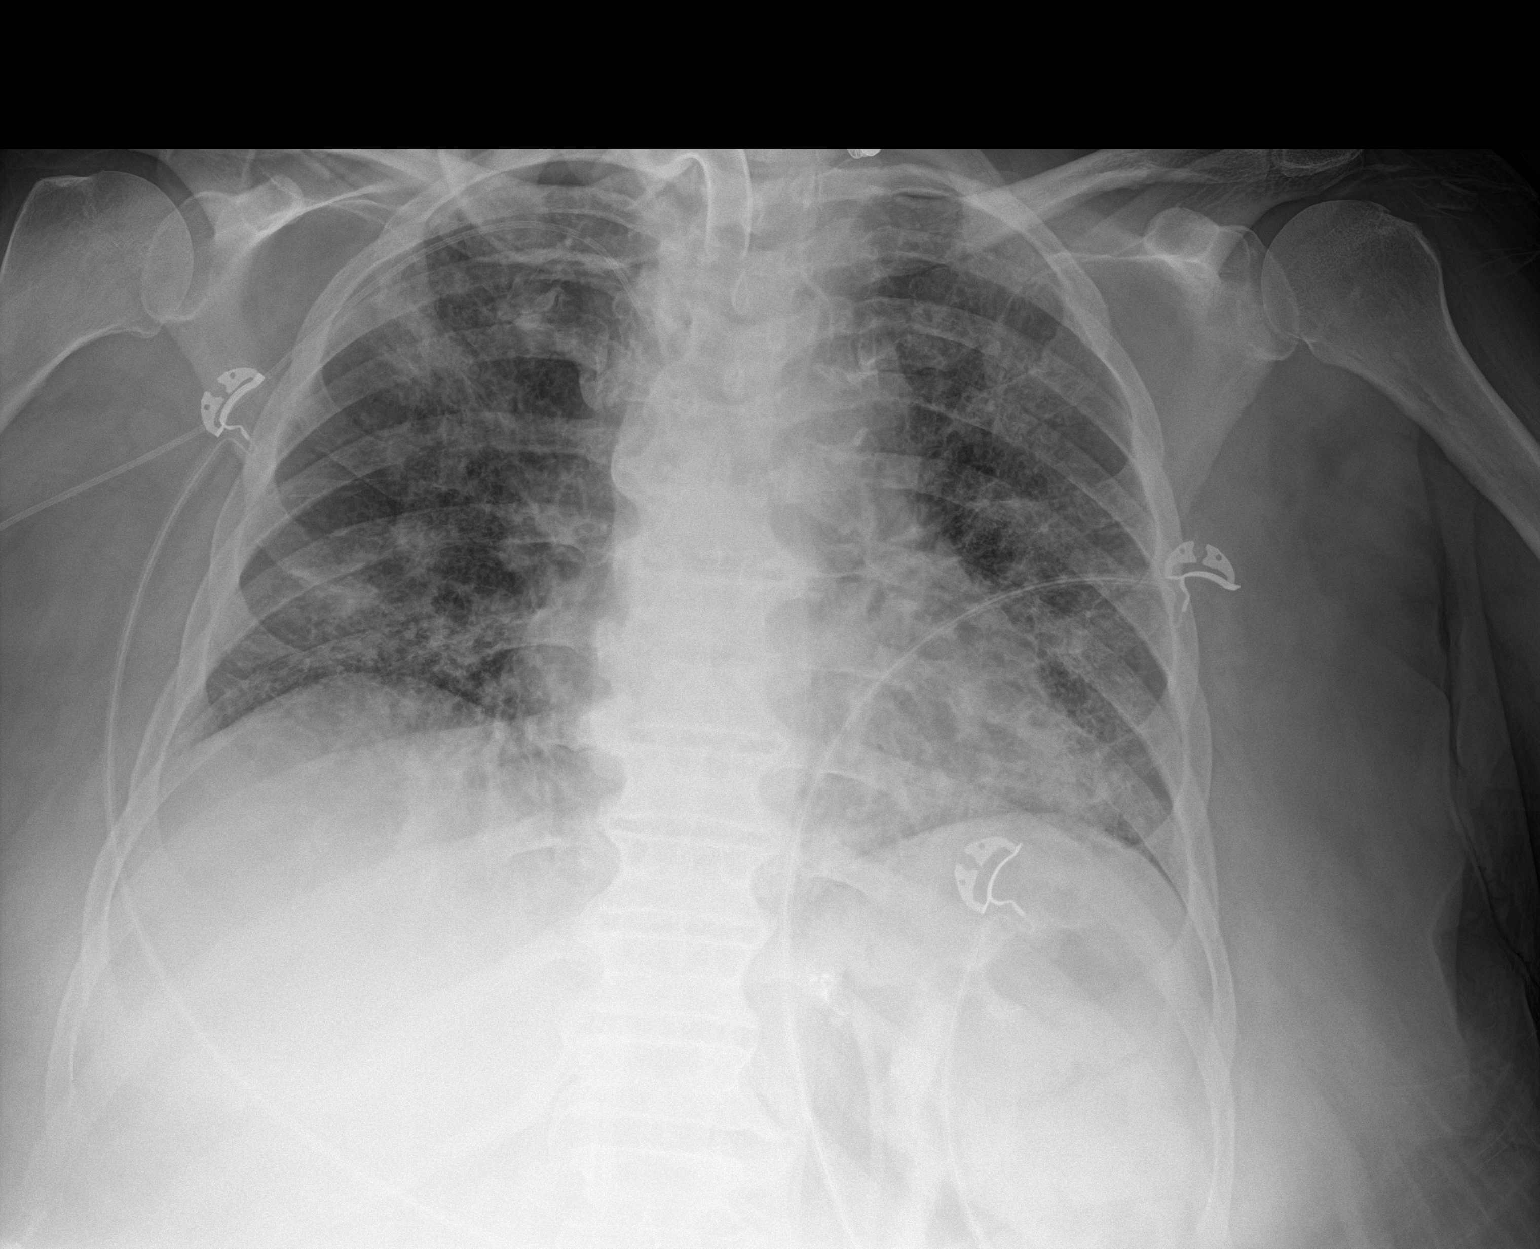

[1 of 1 positions shown; findings below may reference images not displayed]

FINDINGS: Tracheostomy tube appears appropriately positioned in the midline.
Heart size and mediastinal contours are stable.

Coarse lung markings are stable bilaterally indicating chronic
interstitial lung disease. Improved aeration at the lung bases
compared to the most recent chest x-rays. No evidence of active
superimposed pneumonia on today's exam. No pleural effusion or
pneumothorax is seen. Osseous structures about the chest are
unremarkable. RIGHT-sided PICC line in place with tip positioned at
the level of the upper SVC.
IMPRESSION: 1. Improved aeration at the lung bases compared to the most recent
chest x-rays of [DATE]. No evidence of active pneumonia or
pulmonary edema on today's exam.
2. Probable chronic interstitial lung disease.
3. Tracheostomy tube appears appropriately positioned in the
midline.

## 2019-12-19 MED ORDER — HALOPERIDOL LACTATE 5 MG/ML IJ SOLN
2.0000 mg | Freq: Four times a day (QID) | INTRAMUSCULAR | Status: DC | PRN
  Administered 2019-12-20 – 2019-12-29 (×10): 2 mg via INTRAVENOUS
  Filled 2019-12-19 (×10): qty 1

## 2019-12-19 NOTE — Progress Notes (Addendum)
Pt resting in bed, upon touching patient to begin  assessment patient open eyes and begin to air hunger. Pt was suctioned out and noted plugs, pt airway patent but remain struggling. Pt bagged and R.T., RTT and charge requested. Pt also noted to roll eyes back and have ams changes. Nurse and c.n.a. remained with patient while additional staff arrived to assist. MD was made aware before arriving to room and during patient episodic event.

## 2019-12-19 NOTE — Progress Notes (Signed)
PROGRESS NOTE  Susan [name]  [id number] DOB: [birth year] DOA: 11/11/2019 PCP: Hoyt Koch, MD   Brief Narrative: Susan [name] is a 59 year old female with a history of HIV, diabetes with neuropathy, seizure disorder, tobacco use, bipolar disease, schizophrenia, history of IV drug abuse admitted 3/10 to ICU with overdose in shock with aspiration pneumonia with MRSA, pneumococcal bacteremia, toxic metabolic encephalopathy, septic shock and acute versus subacute bilateral MCA infarcts. Patient was intubated on 3/10, difficult to wean off, tracheostomy placed on 3/29. Was treated for MRSA pneumonia and massive volume overload with lasix infusion. Decreased movement and mentation was noted and head CT on 3/22 showed bilateral acute vs. subacute MCA infarcts confirmed on MRI 3/23. [facility] assumed care on 12/06/2019. Disposition is complicated by persistent encephalopathy requiring restraints to protect life-sustaining medical devices including trach and PEG.  Assessment & Plan: Principal Problem:   Acute respiratory failure with hypoxemia (HCC) Active Problems:   HIV disease ([place])   Drug-induced hypotension   DNR (do not resuscitate) discussion   AKI (acute kidney injury) ([place])   Pneumococcal bacteremia   Pneumonia   Aortic valve endocarditis   Acute kidney injury (AKI) with acute tubular necrosis (ATN) (HCC)   Acute metabolic encephalopathy   Cerebral embolism with cerebral infarction   Status post tracheostomy [facility])   Palliative care by specialist   Adult failure to thrive  Acute hypoxic/hypercapnic respiratory failure with ARDS from MRSA pneumonia and aspiration pneumonia, history of asthma: s/p ETT 3/10, tracheostomy 3/29. - PCCM following for tracheostomy care, currently #6 cuffless (since 4/12) undergoing capping trials/tolerating PMV.  - Scheduled and prn BDs  Acute respiratory distress: 4/17. Called to bedside. Patient with tachypnea and flailing. Strongly  suspect behavioral component as exam is relatively unchanged. Possibly precipitated by mucous plugging.  - CXR ordered and personally reviewed showing no new infiltrate, vascular prominence or effusions and no PTX. Lines in normal position. Opacities actually much improved from prior exam.  - Can give nebs as expiratory phase is prolonged though no definite wheezing.  - Doubt cardiac event. ECG also personally reviewed and without ischemic features. - Will add prn haldol   Aerococcus bacteremia 3/25 Klebsiella bronchitis/pneumonia 3/25 - s/p cefepime x7 days on 4/1  Acute large bilateral posterior MCA infarcts 3/23: TEE 3/16 without CES. MRI brain c/w b/l posterior MCA territory infarcts; MRA/ MRV neg - Continue ASA, statin.  - Needs continued PT/OT/SLP  Acute metabolic encephalopathy/hospital delirium: Large CVAs causing brain injury and complicated by preexisting schizophrenia and bipolar disorder.  - Continue home medications including seroquel, cogentin, zoloft, buspar, doxepin, neurontin. Increased HS dose of seroquel to attempt to improve agitation. Continues to require soft restraints, telesitter to preserve trach as the patient's impulsivity continues and she's shown a penchant toward pulling at trach.   Acute systolic heart failure with sepsis induced CM, HTN: No longer requiring regular diuresis.  - Currently on clonidine, amlodipine, maxzide, metoprolol  HLD:  Continue with Crestor as tolerated  Poorly-controlled T2DM: HbA1c 12.3%.  - Continue basal-bolus insulin/CBGs.   HIV: CD4 440, VL 20 in March 2021. - Continue ART  At risk malnutrition: S/p PEG placement on 4/5, by IR - Continue with tube feeds as tolerated  Obesity: Body mass index is 35.38 kg/m.  - Lifestyle modification advised once able to participate in conversation  GOC: Very poor prognosis due to multiple comorbidities - Palliative consulted and had been following  DVT prophylaxis: SCD's Code  Status: Full Family Communication: Daughter by phone  4/16. Status is: Inpatient  Remains inpatient appropriate because:Unsafe d/c plan as pt still requires restraints for her own safety  Dispo: The patient is from: Home  Anticipated d/c is to: SNF  Anticipated d/c date is: > 3 days  Patient currently is not medically stable to d/c.  Consultants:   PCCM  ID  Neurology  Procedures:   Mechanical ventilation  TEE  Trach changed to cuffless 4/12  Subjective: Less interactive and meaningfully responsive this morning. Will not answer questions or follow commands but is constantly moving extremities.   Objective: Vitals:   12/19/19 0902 12/19/19 0935 12/19/19 1114 12/19/19 1154  BP:  102/75  133/76  Pulse:  89 89 87  Resp:  15 (!) 22 13  Temp:  98.4 F (36.9 C)  98.4 F (36.9 C)  TempSrc:  Oral  Oral  SpO2: 97% 96% 97% 98%  Weight:      Height:        Intake/Output Summary (Last 24 hours) at 12/19/2019 1159 Last data filed at 12/19/2019 0700 Gross per 24 hour  Intake -  Output 1451 ml  Net -1451 ml   Filed Weights   12/16/19 0355 12/18/19 0500 12/19/19 0408  Weight: 90.4 kg 89.9 kg 90.6 kg   Gen: 59 y.o. female in no distress Pulm: Nonlabored but tachypneic. Rate normalizes when no stimulation given, but once verbal or tactile input she starts breathing faster. Expiratory phase modestly prolonged, some coarse mostly upper airway sounds bilaterally. CV: Regular rate and rhythm. No murmur, rub, or gallop. No JVD, no pitting dependent edema. GI: Abdomen soft, non-tender, non-distended, with normoactive bowel sounds.  Ext: Warm, no deformities Skin: No new rashes, lesions or ulcers on visualized skin. PEG site without purulence or erythema. Trach site with tan secretions, thick.  Neuro: Alert, not oriented or cooperative with exam.  Psych: Judgement and insight appear impaired, agitated.  Data Reviewed: I have personally  reviewed following labs and imaging studies  CBC: Recent Labs  Lab 12/15/19 0636 12/16/19 0657 12/17/19 0500 12/18/19 1611 12/19/19 0621  WBC 12.7* 11.1* 11.2* 10.9* 10.8*  NEUTROABS 8.3* 6.8 6.6 6.5 6.3  HGB 11.8* 11.3* 11.2* 11.2* 11.0*  HCT 37.8 36.5 36.3 36.0 34.6*  MCV 90.0 88.8 90.1 89.8 88.7  PLT 295 266 241 260 [id number]   Basic Metabolic Panel: Recent Labs  Lab 12/14/19 0603 12/16/19 0657  NA 136 133*  K 4.1 3.6  CL 91* 88*  CO2 31 33*  GLUCOSE 112* 161*  BUN 39* 38*  CREATININE 1.04* 1.04*  CALCIUM 9.4 9.3   GFR: Estimated Creatinine Clearance: 63.8 mL/min (A) (by C-G formula based on SCr of 1.04 mg/dL (H)). Liver Function Tests: Recent Labs  Lab 12/14/19 0603 12/16/19 0657  AST 24 23  ALT 23 20  ALKPHOS 107 110  BILITOT 0.2* 0.5  PROT 7.6 7.7  ALBUMIN 2.5* 2.3*   No results for input(s): LIPASE, AMYLASE in the last 168 hours. No results for input(s): AMMONIA in the last 168 hours. Coagulation Profile: No results for input(s): INR, PROTIME in the last 168 hours. Cardiac Enzymes: No results for input(s): CKTOTAL, CKMB, CKMBINDEX, TROPONINI in the last 168 hours. BNP (last 3 results) No results for input(s): PROBNP in the last 8760 hours. HbA1C: No results for input(s): HGBA1C in the last 72 hours. CBG: Recent Labs  Lab 12/18/19 1714 12/18/19 2001 12/18/19 2348 12/19/19 0402 12/19/19 0943  GLUCAP 151* 123* 95 171* 192*   Lipid Profile: No results for input(s):  CHOL, HDL, LDLCALC, TRIG, CHOLHDL, LDLDIRECT in the last 72 hours. Thyroid Function Tests: No results for input(s): TSH, T4TOTAL, FREET4, T3FREE, THYROIDAB in the last 72 hours. Anemia Panel: No results for input(s): VITAMINB12, FOLATE, FERRITIN, TIBC, IRON, RETICCTPCT in the last 72 hours. Urine analysis:    Component Value Date/Time   COLORURINE YELLOW 11/26/2019 1522   APPEARANCEUR CLEAR 11/26/2019 1522   LABSPEC 1.011 11/26/2019 1522   PHURINE 8.0 11/26/2019 1522   GLUCOSEU  NEGATIVE 11/26/2019 1522   HGBUR NEGATIVE 11/26/2019 1522   BILIRUBINUR NEGATIVE 11/26/2019 1522   KETONESUR NEGATIVE 11/26/2019 1522   PROTEINUR 30 (A) 11/26/2019 1522   UROBILINOGEN 1.0 06/01/2015 0104   NITRITE NEGATIVE 11/26/2019 1522   LEUKOCYTESUR NEGATIVE 11/26/2019 1522   No results found for this or any previous visit (from the past 240 hour(s)).    Radiology Studies: DG Chest Port 1 View  Result Date: 12/19/2019 CLINICAL DATA:  Hypoxia, tachypnea and mucous plugging of bronchi. EXAM: PORTABLE CHEST 1 VIEW COMPARISON:  Chest x-rays dated 11/30/2019 FINDINGS: Tracheostomy tube appears appropriately positioned in the midline. Heart size and mediastinal contours are stable. Coarse lung markings are stable bilaterally indicating chronic interstitial lung disease. Improved aeration at the lung bases compared to the most recent chest x-rays. No evidence of active superimposed pneumonia on today's exam. No pleural effusion or pneumothorax is seen. Osseous structures about the chest are unremarkable. RIGHT-sided PICC line in place with tip positioned at the level of the upper SVC. IMPRESSION: 1. Improved aeration at the lung bases compared to the most recent chest x-rays of 11/30/2019. No evidence of active pneumonia or pulmonary edema on today's exam. 2. Probable chronic interstitial lung disease. 3. Tracheostomy tube appears appropriately positioned in the midline. Electronically Signed   By: Franki Cabot M.D.   On: 12/19/2019 10:01    Scheduled Meds: . amLODipine  10 mg Per Tube Daily  . aspirin  81 mg Per Tube Daily  . benztropine  0.5 mg Per Tube Daily  . budesonide (PULMICORT) nebulizer solution  0.5 mg Nebulization BID  . busPIRone  15 mg Per Tube TID  . chlorhexidine  15 mL Mouth Rinse BID  . Chlorhexidine Gluconate Cloth  6 each Topical Daily  . cloNIDine  0.2 mg Per Tube TID  . dolutegravir  50 mg Oral Daily  . doxepin  50 mg Per Tube QHS  . emtricitabine-tenofovir AF  1  tablet Per Tube Daily  . enoxaparin (LOVENOX) injection  40 mg Subcutaneous Q24H  . feeding supplement (PRO-STAT SUGAR FREE 64)  30 mL Per Tube TID  . free water  200 mL Per Tube Q4H  . gabapentin  300 mg Per Tube Q8H  . insulin aspart  0-20 Units Subcutaneous Q4H  . insulin aspart  5 Units Subcutaneous Q4H  . insulin glargine  10 Units Subcutaneous BID  . mouth rinse  15 mL Mouth Rinse q12n4p  . metoprolol tartrate  12.5 mg Per Tube BID  . pantoprazole sodium  40 mg Per Tube Daily  . polyethylene glycol  17 g Oral Daily  . QUEtiapine  200 mg Per Tube Daily  . QUEtiapine  500 mg Per Tube QHS  . rosuvastatin  20 mg Per Tube q1800  . sennosides  15 mL Per Tube BID  . sertraline  50 mg Per Tube Daily  . triamterene-hydrochlorothiazide  1 tablet Per Tube Daily   Continuous Infusions: . sodium chloride Stopped (12/07/19 0216)  . feeding supplement (JEVITY 1.2 CAL)  1,000 mL (12/18/19 1717)     LOS: 38 days   Time spent: 35 minutes.  Patrecia Pour, MD [facility] [email or web address] 12/19/2019, 11:59 AM

## 2019-12-19 NOTE — Significant Event (Signed)
Rapid Response Event Note  Overview: Respiratory Distress  Initial Focused Assessment: Called by CRN to help with patient having acute respiratory distress, RT was the bedside and patient was being bagged lavaged by RT. Upon arrival, patient appeared in moderate distress, RR was in the 40s, patient was agitated (+BL restraints), appears delirious, and has been confused per nurse. Patient went into distress and became hypoxic per nurse and she called RT. Lung sounds - coarse throughout, wheezing in the upper fields. Patient was suctioned by RT - had thick secretions per RT initially but now does not have any secretions. Skin cool to touch, HR 90s, BP 102/75(86), 98% on 5L 28% TC. TRH MD came to the bedside as well. RR improved after neb treatment but with stimulation RR increases. Unable to assess pupils because patient was quite agitated when I tried.   Interventions: -- DuoNeb x 1  -- STAT CXR -- STAT EKG -- ABG (R)  Plan of Care: -- F/U with CXR, EKG, ABG -- Might need Haldol or something else to help calm the patient. Patient is still quite restless and seems to get agitated with any stimulation.   Event Summary:  Call Time 0840 Arrival Time [id number]  End Time 0910   Christen Wardrop R

## 2019-12-20 DIAGNOSIS — G9341 Metabolic encephalopathy: Secondary | ICD-10-CM | POA: Diagnosis not present

## 2019-12-20 DIAGNOSIS — R627 Adult failure to thrive: Secondary | ICD-10-CM | POA: Diagnosis not present

## 2019-12-20 DIAGNOSIS — N170 Acute kidney failure with tubular necrosis: Secondary | ICD-10-CM | POA: Diagnosis not present

## 2019-12-20 DIAGNOSIS — J9601 Acute respiratory failure with hypoxia: Secondary | ICD-10-CM | POA: Diagnosis not present

## 2019-12-20 LAB — COMPREHENSIVE METABOLIC PANEL
ALT: 27 U/L (ref 0–44)
AST: 26 U/L (ref 15–41)
Albumin: 2.2 g/dL — ABNORMAL LOW (ref 3.5–5.0)
Alkaline Phosphatase: 110 U/L (ref 38–126)
Anion gap: 12 (ref 5–15)
BUN: 39 mg/dL — ABNORMAL HIGH (ref 6–20)
CO2: 35 mmol/L — ABNORMAL HIGH (ref 22–32)
Calcium: 9.5 mg/dL (ref 8.9–10.3)
Chloride: 87 mmol/L — ABNORMAL LOW (ref 98–111)
Creatinine, Ser: 1.04 mg/dL — ABNORMAL HIGH (ref 0.44–1.00)
GFR calc Af Amer: >60 mL/min (ref >60)
GFR calc non Af Amer: 60 mL/min — ABNORMAL LOW (ref >60)
Glucose, Bld: 158 mg/dL — ABNORMAL HIGH (ref 70–99)
Potassium: 3.4 mmol/L — ABNORMAL LOW (ref 3.5–5.1)
Sodium: 134 mmol/L — ABNORMAL LOW (ref 135–145)
Total Bilirubin: 0.2 mg/dL — ABNORMAL LOW (ref 0.3–1.2)
Total Protein: 8.3 g/dL — ABNORMAL HIGH (ref 6.5–8.1)

## 2019-12-20 LAB — GLUCOSE, CAPILLARY
Glucose-Capillary: 127 mg/dL — ABNORMAL HIGH (ref 70–99)
Glucose-Capillary: 141 mg/dL — ABNORMAL HIGH (ref 70–99)
Glucose-Capillary: 159 mg/dL — ABNORMAL HIGH (ref 70–99)
Glucose-Capillary: 174 mg/dL — ABNORMAL HIGH (ref 70–99)
Glucose-Capillary: 183 mg/dL — ABNORMAL HIGH (ref 70–99)
Glucose-Capillary: 74 mg/dL (ref 70–99)

## 2019-12-20 MED ORDER — FREE WATER
200.0000 mL | Freq: Four times a day (QID) | Status: DC
  Administered 2019-12-20 – 2020-01-06 (×68): 200 mL

## 2019-12-20 MED ORDER — POTASSIUM CHLORIDE 20 MEQ/15ML (10%) PO SOLN
20.0000 meq | Freq: Once | ORAL | Status: AC
  Administered 2019-12-20: 20 meq
  Filled 2019-12-20: qty 15

## 2019-12-20 NOTE — Progress Notes (Addendum)
PROGRESS NOTE  Susan [name]  [id number] DOB: 11/05/1960 DOA: 11/11/2019 PCP: Hoyt Koch, MD   Brief Narrative: Susan [name] is a 59 year old female with a history of HIV, diabetes with neuropathy, seizure disorder, tobacco use, bipolar disease, schizophrenia, history of IV drug abuse admitted 3/10 to ICU with overdose in shock with aspiration pneumonia with MRSA, pneumococcal bacteremia, toxic metabolic encephalopathy, septic shock and acute versus subacute bilateral MCA infarcts. Patient was intubated on 3/10, difficult to wean off, tracheostomy placed on 3/29. Was treated for MRSA pneumonia and massive volume overload with lasix infusion. Decreased movement and mentation was noted and head CT on 3/22 showed bilateral acute vs. subacute MCA infarcts confirmed on MRI 3/23. [facility] assumed care on 12/06/2019. Disposition is complicated by persistent encephalopathy requiring restraints to protect life-sustaining medical devices including trach and PEG.  Assessment & Plan: Principal Problem:   Acute respiratory failure with hypoxemia (HCC) Active Problems:   HIV disease ([place])   Drug-induced hypotension   DNR (do not resuscitate) discussion   AKI (acute kidney injury) ([place])   Pneumococcal bacteremia   Pneumonia   Aortic valve endocarditis   Acute kidney injury (AKI) with acute tubular necrosis (ATN) (HCC)   Acute metabolic encephalopathy   Cerebral embolism with cerebral infarction   Status post tracheostomy [facility])   Palliative care by specialist   Adult failure to thrive  Acute hypoxic/hypercapnic respiratory failure with ARDS from MRSA pneumonia and aspiration pneumonia, history of asthma: s/p ETT 3/10, tracheostomy 3/29. - PCCM following for tracheostomy care, currently #6 cuffless (since 4/12) undergoing capping trials/tolerating PMV.  - Scheduled and prn BDs  Acute respiratory distress: Possibly precipitated by mucous plugging. Appears to be improved.  -  Continue close monitoring.   Aerococcus bacteremia 3/25 Klebsiella bronchitis/pneumonia 3/25 - s/p cefepime x7 days on 4/1  Acute large bilateral posterior MCA infarcts 3/23: TEE 3/16 without CES. MRI brain c/w b/l posterior MCA territory infarcts; MRA/ MRV neg - Continue ASA, statin.  - Needs continued PT/OT/SLP  Acute metabolic encephalopathy/hospital delirium: Large CVAs causing brain injury and complicated by preexisting schizophrenia and bipolar disorder.  - Continue home medications including seroquel, cogentin, zoloft, buspar, doxepin, neurontin. Increased HS dose of seroquel to attempt to improve agitation. Continues to require soft restraints, telesitter to preserve trach as the patient's impulsivity continues and she's shown a penchant toward pulling at trach. Added haldol prn. Still requiring wrist restraints.   Acute systolic heart failure with sepsis induced CM, HTN: No longer requiring regular diuresis.  - Currently on clonidine, amlodipine, maxzide, metoprolol  HLD:  Continue with Crestor as tolerated  Poorly-controlled T2DM: HbA1c 12.3%.  - Continue basal-bolus insulin/CBGs.   Hyponatremia:  - Decrease free water, monitor  Hypokalemia: Supplement.  HIV: CD4 440, VL 20 in March 2021. - Continue ART  At risk malnutrition: S/p PEG placement on 4/5, by IR - Continue with tube feeds as tolerated  Obesity: Body mass index is 35.73 kg/m.  - Lifestyle modification advised once able to participate in conversation  GOC: Very poor prognosis due to multiple comorbidities - Palliative consulted and had been following  DVT prophylaxis: SCD's Code Status: Full Family Communication: Daughter by phone 4/16. Status is: Inpatient  Remains inpatient appropriate because:Unsafe d/c plan as pt still requires restraints for her own safety  Dispo: The patient is from: Home  Anticipated d/c is to: SNF  Anticipated d/c date is: > 3  days  Patient currently is not medically stable to d/c.  Consultants:  PCCM  ID  Neurology  Procedures:   Mechanical ventilation  TEE  Trach changed to cuffless 4/12  Subjective: Patient has no new complaints, breathing is improved, stable from last night. No pain reported.   Objective: Vitals:   12/20/19 1110 12/20/19 1310 12/20/19 1507 12/20/19 1634  BP:  102/87  125/64  Pulse: 73 80 78 84  Resp:  20  16  Temp:  98.9 F (37.2 C)  98.5 F (36.9 C)  TempSrc:  Oral  Oral  SpO2: 99% 98% 100% 99%  Weight:      Height:        Intake/Output Summary (Last 24 hours) at 12/20/2019 1923 Last data filed at 12/20/2019 1601 Gross per 24 hour  Intake --  Output 625 ml  Net -625 ml   Filed Weights   12/18/19 0500 12/19/19 0408 12/20/19 0413  Weight: 89.9 kg 90.6 kg 91.5 kg   Gen: 59 y.o. female in no distress Pulm: Nonlabored breathing. Clear. CV: Regular rate and rhythm. No murmur, rub, or gallop. No JVD, no dependent edema. GI: Abdomen soft, non-tender, non-distended, with normoactive bowel sounds.  Ext: Warm, no deformities Skin: No new rashes, lesions or ulcers on visualized skin. PEG site c/d/i Neuro: Alert, disoriented. No focal neurological deficits. Psych: Judgement and insight appear impaired. Mood euthymic & affect congruent. Behavior is appropriate.    Data Reviewed: I have personally reviewed following labs and imaging studies  CBC: Recent Labs  Lab 12/15/19 0636 12/16/19 0657 12/17/19 0500 12/18/19 1611 12/19/19 0621  WBC 12.7* 11.1* 11.2* 10.9* 10.8*  NEUTROABS 8.3* 6.8 6.6 6.5 6.3  HGB 11.8* 11.3* 11.2* 11.2* 11.0*  HCT 37.8 36.5 36.3 36.0 34.6*  MCV 90.0 88.8 90.1 89.8 88.7  PLT 295 266 241 260 [id number]   Basic Metabolic Panel: Recent Labs  Lab 12/14/19 0603 12/16/19 0657 12/20/19 0611  NA 136 133* 134*  K 4.1 3.6 3.4*  CL 91* 88* 87*  CO2 31 33* 35*  GLUCOSE 112* 161* 158*  BUN 39* 38* 39*  CREATININE 1.04* 1.04* 1.04*   CALCIUM 9.4 9.3 9.5   GFR: Estimated Creatinine Clearance: 64.1 mL/min (A) (by C-G formula based on SCr of 1.04 mg/dL (H)). Liver Function Tests: Recent Labs  Lab 12/14/19 0603 12/16/19 0657 12/20/19 0611  AST 24 23 26   ALT 23 20 27   ALKPHOS 107 110 110  BILITOT 0.2* 0.5 0.2*  PROT 7.6 7.7 8.3*  ALBUMIN 2.5* 2.3* 2.2*   No results for input(s): LIPASE, AMYLASE in the last 168 hours. No results for input(s): AMMONIA in the last 168 hours. Coagulation Profile: No results for input(s): INR, PROTIME in the last 168 hours. Cardiac Enzymes: No results for input(s): CKTOTAL, CKMB, CKMBINDEX, TROPONINI in the last 168 hours. BNP (last 3 results) No results for input(s): PROBNP in the last 8760 hours. HbA1C: No results for input(s): HGBA1C in the last 72 hours. CBG: Recent Labs  Lab 12/20/19 0002 12/20/19 0355 12/20/19 0834 12/20/19 1313 12/20/19 1633  GLUCAP 174* 183* 141* 159* 127*   Lipid Profile: No results for input(s): CHOL, HDL, LDLCALC, TRIG, CHOLHDL, LDLDIRECT in the last 72 hours. Thyroid Function Tests: No results for input(s): TSH, T4TOTAL, FREET4, T3FREE, THYROIDAB in the last 72 hours. Anemia Panel: No results for input(s): VITAMINB12, FOLATE, FERRITIN, TIBC, IRON, RETICCTPCT in the last 72 hours. Urine analysis:    Component Value Date/Time   COLORURINE YELLOW 11/26/2019 1522   APPEARANCEUR CLEAR 11/26/2019 1522   LABSPEC 1.011 11/26/2019 1522  PHURINE 8.0 11/26/2019 1522   GLUCOSEU NEGATIVE 11/26/2019 1522   HGBUR NEGATIVE 11/26/2019 1522   BILIRUBINUR NEGATIVE 11/26/2019 1522   [place] 11/26/2019 1522   PROTEINUR 30 (A) 11/26/2019 1522   UROBILINOGEN 1.0 06/01/2015 0104   NITRITE NEGATIVE 11/26/2019 1522   LEUKOCYTESUR NEGATIVE 11/26/2019 1522   No results found for this or any previous visit (from the past 240 hour(s)).    Radiology Studies: DG Chest Port 1 View  Result Date: 12/19/2019 CLINICAL DATA:  Hypoxia, tachypnea and  mucous plugging of bronchi. EXAM: PORTABLE CHEST 1 VIEW COMPARISON:  Chest x-rays dated 11/30/2019 FINDINGS: Tracheostomy tube appears appropriately positioned in the midline. Heart size and mediastinal contours are stable. Coarse lung markings are stable bilaterally indicating chronic interstitial lung disease. Improved aeration at the lung bases compared to the most recent chest x-rays. No evidence of active superimposed pneumonia on today's exam. No pleural effusion or pneumothorax is seen. Osseous structures about the chest are unremarkable. RIGHT-sided PICC line in place with tip positioned at the level of the upper SVC. IMPRESSION: 1. Improved aeration at the lung bases compared to the most recent chest x-rays of 11/30/2019. No evidence of active pneumonia or pulmonary edema on today's exam. 2. Probable chronic interstitial lung disease. 3. Tracheostomy tube appears appropriately positioned in the midline. Electronically Signed   By: Franki Cabot M.D.   On: 12/19/2019 10:01    Scheduled Meds: . amLODipine  10 mg Per Tube Daily  . aspirin  81 mg Per Tube Daily  . benztropine  0.5 mg Per Tube Daily  . budesonide (PULMICORT) nebulizer solution  0.5 mg Nebulization BID  . busPIRone  15 mg Per Tube TID  . chlorhexidine  15 mL Mouth Rinse BID  . Chlorhexidine Gluconate Cloth  6 each Topical Daily  . cloNIDine  0.2 mg Per Tube TID  . dolutegravir  50 mg Oral Daily  . doxepin  50 mg Per Tube QHS  . emtricitabine-tenofovir AF  1 tablet Per Tube Daily  . enoxaparin (LOVENOX) injection  40 mg Subcutaneous Q24H  . feeding supplement (PRO-STAT SUGAR FREE 64)  30 mL Per Tube TID  . free water  200 mL Per Tube Q6H  . gabapentin  300 mg Per Tube Q8H  . insulin aspart  0-20 Units Subcutaneous Q4H  . insulin aspart  5 Units Subcutaneous Q4H  . insulin glargine  10 Units Subcutaneous BID  . mouth rinse  15 mL Mouth Rinse q12n4p  . metoprolol tartrate  12.5 mg Per Tube BID  . pantoprazole sodium  40 mg  Per Tube Daily  . polyethylene glycol  17 g Oral Daily  . QUEtiapine  200 mg Per Tube Daily  . QUEtiapine  500 mg Per Tube QHS  . rosuvastatin  20 mg Per Tube q1800  . sennosides  15 mL Per Tube BID  . sertraline  50 mg Per Tube Daily  . triamterene-hydrochlorothiazide  1 tablet Per Tube Daily   Continuous Infusions: . sodium chloride Stopped (12/07/19 0216)  . feeding supplement (JEVITY 1.2 CAL) 1,000 mL (12/18/19 1717)     LOS: 39 days   Time spent: 25 minutes.  Patrecia Pour, MD [facility] [email or web address] 12/20/2019, 7:23 PM

## 2019-12-21 DIAGNOSIS — J9601 Acute respiratory failure with hypoxia: Secondary | ICD-10-CM | POA: Diagnosis not present

## 2019-12-21 DIAGNOSIS — N170 Acute kidney failure with tubular necrosis: Secondary | ICD-10-CM | POA: Diagnosis not present

## 2019-12-21 DIAGNOSIS — R627 Adult failure to thrive: Secondary | ICD-10-CM | POA: Diagnosis not present

## 2019-12-21 DIAGNOSIS — G9341 Metabolic encephalopathy: Secondary | ICD-10-CM | POA: Diagnosis not present

## 2019-12-21 LAB — CBC
HCT: 34.1 % — ABNORMAL LOW (ref 36.0–46.0)
Hemoglobin: 10.7 g/dL — ABNORMAL LOW (ref 12.0–15.0)
MCH: 27.9 pg (ref 26.0–34.0)
MCHC: 31.4 g/dL (ref 30.0–36.0)
MCV: 89 fL (ref 80.0–100.0)
Platelets: 269 10*3/uL (ref 150–400)
RBC: 3.83 MIL/uL — ABNORMAL LOW (ref 3.87–5.11)
RDW: 17.3 % — ABNORMAL HIGH (ref 11.5–15.5)
WBC: 11.1 10*3/uL — ABNORMAL HIGH (ref 4.0–10.5)
nRBC: 0 % (ref 0.0–0.2)

## 2019-12-21 LAB — GLUCOSE, CAPILLARY
Glucose-Capillary: 127 mg/dL — ABNORMAL HIGH (ref 70–99)
Glucose-Capillary: 162 mg/dL — ABNORMAL HIGH (ref 70–99)
Glucose-Capillary: 182 mg/dL — ABNORMAL HIGH (ref 70–99)
Glucose-Capillary: 206 mg/dL — ABNORMAL HIGH (ref 70–99)
Glucose-Capillary: 215 mg/dL — ABNORMAL HIGH (ref 70–99)
Glucose-Capillary: 82 mg/dL (ref 70–99)
Glucose-Capillary: 87 mg/dL (ref 70–99)

## 2019-12-21 LAB — BASIC METABOLIC PANEL
Anion gap: 12 (ref 5–15)
BUN: 41 mg/dL — ABNORMAL HIGH (ref 6–20)
CO2: 34 mmol/L — ABNORMAL HIGH (ref 22–32)
Calcium: 9.4 mg/dL (ref 8.9–10.3)
Chloride: 87 mmol/L — ABNORMAL LOW (ref 98–111)
Creatinine, Ser: 1.16 mg/dL — ABNORMAL HIGH (ref 0.44–1.00)
GFR calc Af Amer: >60 mL/min (ref >60)
GFR calc non Af Amer: 52 mL/min — ABNORMAL LOW (ref >60)
Glucose, Bld: 247 mg/dL — ABNORMAL HIGH (ref 70–99)
Potassium: 3.8 mmol/L (ref 3.5–5.1)
Sodium: 133 mmol/L — ABNORMAL LOW (ref 135–145)

## 2019-12-21 NOTE — Progress Notes (Signed)
PROGRESS NOTE  Susan [name]  [id number] DOB: 23-Mar-1961 DOA: 11/11/2019 PCP: Hoyt Koch, MD   Brief Narrative: Susan [name] is a 59 year old female with a history of HIV, diabetes with neuropathy, seizure disorder, tobacco use, bipolar disease, schizophrenia, history of IV drug abuse admitted 3/10 to ICU with overdose in shock with aspiration pneumonia with MRSA, pneumococcal bacteremia, toxic metabolic encephalopathy, septic shock and acute versus subacute bilateral MCA infarcts. Patient was intubated on 3/10, difficult to wean off, tracheostomy placed on 3/29. Was treated for MRSA pneumonia and massive volume overload with lasix infusion. Decreased movement and mentation was noted and head CT on 3/22 showed bilateral acute vs. subacute MCA infarcts confirmed on MRI 3/23. [facility] assumed care on 12/06/2019. Disposition is complicated by persistent encephalopathy requiring restraints to protect life-sustaining medical devices including trach and PEG. Psychiatry has been consulted for assistance with medication management.  Assessment & Plan: Principal Problem:   Acute respiratory failure with hypoxemia (HCC) Active Problems:   HIV disease (HCC)   Schizoaffective disorder, bipolar type ([place])   Drug-induced hypotension   DNR (do not resuscitate) discussion   AKI (acute kidney injury) ([place])   Pneumococcal bacteremia   Pneumonia   Aortic valve endocarditis   Acute kidney injury (AKI) with acute tubular necrosis (ATN) (HCC)   Acute metabolic encephalopathy   Cerebral embolism with cerebral infarction   Status post tracheostomy [facility])   Palliative care by specialist   Adult failure to thrive  Acute hypoxic/hypercapnic respiratory failure with ARDS from MRSA pneumonia and aspiration pneumonia, history of asthma: s/p ETT 3/10, tracheostomy 3/29. - PCCM following for tracheostomy care, currently #6 cuffless (since 4/12) undergoing capping trials/tolerating PMV. PCCM  following 1-2x/wk, considering decannulation soon. - Scheduled and prn BDs  Acute respiratory distress: Possibly precipitated by mucous plugging. Appears to be improved.  - Continue close monitoring.   Aerococcus bacteremia 3/25 Klebsiella bronchitis/pneumonia 3/25 - s/p cefepime x7 days on 4/1  Acute large bilateral posterior MCA infarcts 3/23: TEE 3/16 without CES. MRI brain c/w b/l posterior MCA territory infarcts; MRA/ MRV neg - Continue ASA, statin.  - Needs continued PT/OT/SLP  Acute metabolic encephalopathy/hospital delirium: Large CVAs causing brain injury and complicated by preexisting schizophrenia and bipolar disorder.  - Continued home medications including seroquel, cogentin, zoloft, buspar, doxepin, neurontin. I've titrated up on seroquel dosing though symptoms remain refractory. Given the complexity of her psychiatric history and severity of delirium, psychiatry has been consulted for recommendations to assist with delirium.   Acute systolic heart failure with sepsis induced CM, HTN: No longer requiring regular diuresis.  - Currently on clonidine, amlodipine, triamterene/HCTZ, metoprolol  HLD:  - Continue crestor  Poorly-controlled T2DM: HbA1c 12.3%.  - Continue basal-bolus insulin/CBGs. At inpatient goal.  Hyponatremia:  - Decreased free water, monitor BMP in AM.  Hypokalemia: Supplemented.  HIV: CD4 440, VL 20 in March 2021. - Continue ART  At risk malnutrition: S/p PEG placement on 4/5, by IR - Continue with tube feeds as tolerated  Obesity: Body mass index is 35.15 kg/m.  - Lifestyle modification advised once able to participate in conversation  GOC: Very poor prognosis due to multiple comorbidities - Palliative consulted and had been following  DVT prophylaxis: SCD's Code Status: Full Family Communication: Daughter by phone 4/16. D/w palliative care who will reach out to daughter today. Status is: Inpatient  Remains inpatient appropriate  because:Unsafe d/c plan as pt still requires restraints for her own safety  Dispo: The patient is from: Home  Anticipated d/c is to: SNF  Anticipated d/c date is: > 3 days  Patient currently is not medically stable to d/c. Requires control of delirium enough to no longer require restraints.  Consultants:   PCCM  ID  Neurology  Procedures:   Mechanical ventilation  TEE  Trach changed to cuffless 4/12  Subjective: Patient follows commands intermittently, is able to phonate without PMV somewhat but is confused. Remains agitated and restless at times improved with haldol  Objective: Vitals:   12/21/19 0749 12/21/19 0806 12/21/19 1119 12/21/19 1532  BP:  124/64 124/64   Pulse: 90  89 76  Resp: 17 11 18 18   Temp:  98.8 F (37.1 C)    TempSrc:  Oral    SpO2: 99% 97% 98% 100%  Weight:      Height:        Intake/Output Summary (Last 24 hours) at 12/21/2019 1624 Last data filed at 12/21/2019 0750 Gross per 24 hour  Intake 4743.34 ml  Output 2 ml  Net 4741.34 ml   Filed Weights   12/19/19 0408 12/20/19 0413 12/21/19 0347  Weight: 90.6 kg 91.5 kg 90 kg   Gen: 59 y.o. female in no distress Pulm: Nonlabored breathing 28% ATC. Clear. CV: Regular rate and rhythm. No murmur, rub, or gallop. No JVD, no significant dependent edema. GI: Abdomen soft, non-tender, non-distended, with normoactive bowel sounds.  Ext: Warm, no deformities Skin: No new rashes, lesions or ulcers on visualized skin. PEG site with slough, no significant erythema or discharge. Neuro: Alert and disoriented, moves all extremities. Psych: Judgement and insight appear impaired.   Data Reviewed: I have personally reviewed following labs and imaging studies  CBC: Recent Labs  Lab 12/15/19 0636 12/15/19 0636 12/16/19 0657 12/17/19 0500 12/18/19 1611 12/19/19 0621 12/21/19 0425  WBC 12.7*   < > 11.1* 11.2* 10.9* 10.8* 11.1*  NEUTROABS 8.3*  --  6.8 6.6 6.5 6.3   --   HGB 11.8*   < > 11.3* 11.2* 11.2* 11.0* 10.7*  HCT 37.8   < > 36.5 36.3 36.0 34.6* 34.1*  MCV 90.0   < > 88.8 90.1 89.8 88.7 89.0  PLT 295   < > 266 241 260 253 269   < > = values in this interval not displayed.   Basic Metabolic Panel: Recent Labs  Lab 12/16/19 0657 12/20/19 0611 12/21/19 0425  NA 133* 134* 133*  K 3.6 3.4* 3.8  CL 88* 87* 87*  CO2 33* 35* 34*  GLUCOSE 161* 158* 247*  BUN 38* 39* 41*  CREATININE 1.04* 1.04* 1.16*  CALCIUM 9.3 9.5 9.4   GFR: Estimated Creatinine Clearance: 56.9 mL/min (A) (by C-G formula based on SCr of 1.16 mg/dL (H)). Liver Function Tests: Recent Labs  Lab 12/16/19 0657 12/20/19 0611  AST 23 26  ALT 20 27  ALKPHOS 110 110  BILITOT 0.5 0.2*  PROT 7.7 8.3*  ALBUMIN 2.3* 2.2*   No results for input(s): LIPASE, AMYLASE in the last 168 hours. No results for input(s): AMMONIA in the last 168 hours. Coagulation Profile: No results for input(s): INR, PROTIME in the last 168 hours. Cardiac Enzymes: No results for input(s): CKTOTAL, CKMB, CKMBINDEX, TROPONINI in the last 168 hours. BNP (last 3 results) No results for input(s): PROBNP in the last 8760 hours. HbA1C: No results for input(s): HGBA1C in the last 72 hours. CBG: Recent Labs  Lab 12/21/19 0016 12/21/19 0344 12/21/19 0821 12/21/19 1130 12/21/19 1531  GLUCAP 127* 206* 215* 87 82  Lipid Profile: No results for input(s): CHOL, HDL, LDLCALC, TRIG, CHOLHDL, LDLDIRECT in the last 72 hours. Thyroid Function Tests: No results for input(s): TSH, T4TOTAL, FREET4, T3FREE, THYROIDAB in the last 72 hours. Anemia Panel: No results for input(s): VITAMINB12, FOLATE, FERRITIN, TIBC, IRON, RETICCTPCT in the last 72 hours. Urine analysis:    Component Value Date/Time   COLORURINE YELLOW 11/26/2019 1522   APPEARANCEUR CLEAR 11/26/2019 1522   LABSPEC 1.011 11/26/2019 1522   PHURINE 8.0 11/26/2019 1522   GLUCOSEU NEGATIVE 11/26/2019 1522   HGBUR NEGATIVE 11/26/2019 1522    BILIRUBINUR NEGATIVE 11/26/2019 1522   KETONESUR NEGATIVE 11/26/2019 1522   PROTEINUR 30 (A) 11/26/2019 1522   UROBILINOGEN 1.0 06/01/2015 0104   NITRITE NEGATIVE 11/26/2019 1522   LEUKOCYTESUR NEGATIVE 11/26/2019 1522   No results found for this or any previous visit (from the past 240 hour(s)).    Radiology Studies: No results found.  Scheduled Meds: . amLODipine  10 mg Per Tube Daily  . aspirin  81 mg Per Tube Daily  . benztropine  0.5 mg Per Tube Daily  . budesonide (PULMICORT) nebulizer solution  0.5 mg Nebulization BID  . busPIRone  15 mg Per Tube TID  . chlorhexidine  15 mL Mouth Rinse BID  . Chlorhexidine Gluconate Cloth  6 each Topical Daily  . cloNIDine  0.2 mg Per Tube TID  . dolutegravir  50 mg Oral Daily  . doxepin  50 mg Per Tube QHS  . emtricitabine-tenofovir AF  1 tablet Per Tube Daily  . enoxaparin (LOVENOX) injection  40 mg Subcutaneous Q24H  . feeding supplement (PRO-STAT SUGAR FREE 64)  30 mL Per Tube TID  . free water  200 mL Per Tube Q6H  . gabapentin  300 mg Per Tube Q8H  . insulin aspart  0-20 Units Subcutaneous Q4H  . insulin aspart  5 Units Subcutaneous Q4H  . insulin glargine  10 Units Subcutaneous BID  . mouth rinse  15 mL Mouth Rinse q12n4p  . metoprolol tartrate  12.5 mg Per Tube BID  . pantoprazole sodium  40 mg Per Tube Daily  . polyethylene glycol  17 g Oral Daily  . QUEtiapine  200 mg Per Tube Daily  . QUEtiapine  500 mg Per Tube QHS  . rosuvastatin  20 mg Per Tube q1800  . sennosides  15 mL Per Tube BID  . sertraline  50 mg Per Tube Daily  . triamterene-hydrochlorothiazide  1 tablet Per Tube Daily   Continuous Infusions: . sodium chloride Stopped (12/07/19 0216)  . feeding supplement (JEVITY 1.2 CAL) 1,000 mL (12/20/19 2212)     LOS: 40 days   Time spent: 25 minutes.  Patrecia Pour, MD [facility] [email or web address] 12/21/2019, 4:24 PM
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
## 2019-12-21 NOTE — Progress Notes (Signed)
NAME:  Susan [name], MRN:  [id number], DOB:  11/22/1960, LOS: 32 ADMISSION DATE:  11/11/2019, CONSULTATION DATE:  3/10 REFERRING MD:  Sabra Heck, CHIEF COMPLAINT:  Acute encephalopathy and respiratory failure    Brief History   59 year old female smoker admitted 3/10 with overdose complicated by aspiration PNA with MRSA,  Pneumococcal bacteremia, toxic metabolic encephalopathy and septic shock.    Past Medical History  Frequent falls at home,Tobacco abuse, diabetes with painful polyneuropathy, bipolar disease, GERD, HIV, schizophrenia, history of IV drug abuse, seizure disorder.   Significant Hospital Events   3/10 Admit with overdose, intubated on arrival, in shock   3/15 Ventilator asynchrony, heavy sedation & intermittent paralysis   3/16 Lasix drip.  Awaiting TEE.  Sputum w/ MRSA.   Coming back down on sedation, changed to [facility]. TEE showing NO EVIDENCE of endocarditis. ABX changed to pen G per ID. MRSA therapy complete. EF improved. Now up to 50% from 20-25% 3/17 Heavily sedated. Having episodes of bradycardia. Still massively overloaded (>14L), increasing lasix gtt to 8 mg from 4, adding metolazone.  3/19 Lasix to 40 every 12, changing RASS goal to -2, decrease in fentanyl, decreasing PEEP, anticipating trialing pressure support ventilation.  Watching upward trend of white blood cell count and low-grade fever 3/20 BM early am. Agitation early am > sedate post am PT psy meds 3/22 Decreased mentation and movement of extremities overnight, sent for Head CT which revealed acute vs subacute bilateral MCA infarcts  3/23 Lying in bed seen spontaneously moving lower and right upper extremity, unable to follow any commands.  3/24 Hypoglycemic episodes overnight, Weaning well 10/5 today, tmax 101.5 Remains on precedex gtt 1.2 3/29 tracheostomy placed 4/12 #6 shiley cuffed exchanged for #6 cuffless.  4/14 PMV with SLP 4/15 Plan for MBS  Consults:  ID Cards for TEE Neurology  Palliative  care  Procedures:  ETT 3/10 >>  R Fem TLC (ER) 3/10  R IJ TLC 3/10  L IJ TLC 3/10 >> 3/17  RUE PICC 3/16 >>  Trach 3/29 >>  PEG 4/05 >>   Significant Diagnostic Tests:   CT brain 3/10 >> Normal head CT  EEG 3/10 >> Moderate to severe diffuse encephalopathy. No seizures  Urine drug screen 3/10 >> positive for THC, acetaminophen Q000111Q, salicylate <7, ETOH Q000111Q  ECHO 3/11 >> EF 20-25%, LV function severely diminished, Global Hypokinesis,LV hypertrophy, Grade 1 Diastolic Dysfunction, Elevated PASP, Dilated LA, MV regurgitation, small mobile density right coronary cusp  TEE 3/16 >> LVEF 55%, No LA/LAA thrombus or masses, Previously noted mass on the aortic valve is a node of Arancius.  Normal variant and no evidence of endocarditis.Trivial TR and trivial PR 3/23 MRI brain >>Acute nonhemorrhagic infarcts in the posterior MCA territory bilaterally. Otherwise no evidence of chronic ischemia. 3/23 MRA head >>No significant intracranial stenosis.  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 3/23 MRV head >> negative  Micro Data:  Influenza A/B 3/10 >> negative  COVID 3/10 >> negative  MRSA PCR 3/10 >> positive  BCx2 3/10 >> streptococcus pneumoniae >> S-rocephin BCx1 (aerobic only) 3/10 >> negative  Tracheal aspirate 3/11 >> MRSA BCx2 3/12 >> negative  BCx2 3/25 >> aerococcus  Resp cx 3/25 >> Klebsiella (R amp)   Antimicrobials:  Vancomycin 3/10 >> 3/16 Unasyn 3/10 >> 3/16 Pen G 3/16 >> 3/23 Cefepime 3/26 >> 3/31  Interim history/subjective:  Agitated  No distress tolerating ATC well   Objective   Blood Pressure 124/64 (BP Location: Left Arm)  Pulse 90   Temperature 98.8 F (37.1 C) (Oral)   Respiration 11   Height 5\' 3"  (1.6 m)   Weight 90 kg   Last Menstrual Period  (LMP Unknown)   Oxygen Saturation 97%   Body Mass Index 35.15 kg/m   I/O last 3 completed shifts: In: 4743.3 [NG/GT:4743.3] Out: 627 [Urine:625; Stool:2]  Examination:   General resting 59 year female. Agitated at times.  HENT NCAT. #6 cuffless trach. Thick tan/yellow secretions. + phonation 2/2 mild tracheal occlusion from mucous  Pulm scattered rhonchi -no accessory use. ATC 28%  Card RRR no MRG abd PEG tube Neuro confused. Moves all ext.    Resolved Hospital Problem list   Rhabdomyolysis, Elevated INR, Septic shock, MRSA and aspiration pneumonia, and Pneumococcal bacteremia, Aerococcus bacteremia, Klebsiella HCAP ARDS from MRSA PNA Aspiration PNA  Aerococcus bacteremia Klebsiella PNA   Assessment & Plan:   Acute respiratory failure 2/2 PNA requiring intubation, now s/p tracheostomy  Hx of asthma. Acute bilateral large bilateral posterior MCA infarcts Hx seizures, hx anoxic injury-supportive care  Schizophrenia, acute encephalpathy- seroquel, zoloft, buspar, cogentin, doxepin, gabapentin, PRN restraints HIV- tivicay, descovy  Acute systolic heart failure with sepsis induced CM - s/p TEE and without signs of endocarditis HTN, HLD DM II, poorly controlled A1c 12.3 - SSI + basal  Obesity, At risk malnutrition- s/p PEG 4/5.   Discussion  Doing well from a respiratory standpoint 28% ATC. Cough mechanics seems strong. May be a candidate for decannulation at some point.  Would continue current routine trach care. Would cont PMV as tolerated   Erick Colace ACNP-BC [place] Pager # [phone] OR # [phone] if no answer

## 2019-12-22 DIAGNOSIS — J9601 Acute respiratory failure with hypoxia: Secondary | ICD-10-CM | POA: Diagnosis not present

## 2019-12-22 DIAGNOSIS — G9341 Metabolic encephalopathy: Secondary | ICD-10-CM | POA: Diagnosis not present

## 2019-12-22 DIAGNOSIS — F250 Schizoaffective disorder, bipolar type: Secondary | ICD-10-CM

## 2019-12-22 DIAGNOSIS — R627 Adult failure to thrive: Secondary | ICD-10-CM | POA: Diagnosis not present

## 2019-12-22 DIAGNOSIS — Z515 Encounter for palliative care: Secondary | ICD-10-CM | POA: Diagnosis not present

## 2019-12-22 DIAGNOSIS — N170 Acute kidney failure with tubular necrosis: Secondary | ICD-10-CM | POA: Diagnosis not present

## 2019-12-22 DIAGNOSIS — Z930 Tracheostomy status: Secondary | ICD-10-CM | POA: Diagnosis not present

## 2019-12-22 LAB — BASIC METABOLIC PANEL
Anion gap: 11 (ref 5–15)
BUN: 42 mg/dL — ABNORMAL HIGH (ref 6–20)
CO2: 34 mmol/L — ABNORMAL HIGH (ref 22–32)
Calcium: 9.5 mg/dL (ref 8.9–10.3)
Chloride: 91 mmol/L — ABNORMAL LOW (ref 98–111)
Creatinine, Ser: 1.16 mg/dL — ABNORMAL HIGH (ref 0.44–1.00)
GFR calc Af Amer: >60 mL/min (ref >60)
GFR calc non Af Amer: 52 mL/min — ABNORMAL LOW (ref >60)
Glucose, Bld: 119 mg/dL — ABNORMAL HIGH (ref 70–99)
Potassium: 3.6 mmol/L (ref 3.5–5.1)
Sodium: 136 mmol/L (ref 135–145)

## 2019-12-22 LAB — GLUCOSE, CAPILLARY
Glucose-Capillary: 167 mg/dL — ABNORMAL HIGH (ref 70–99)
Glucose-Capillary: 186 mg/dL — ABNORMAL HIGH (ref 70–99)
Glucose-Capillary: 156 mg/dL — ABNORMAL HIGH (ref 70–99)
Glucose-Capillary: 112 mg/dL — ABNORMAL HIGH (ref 70–99)
Glucose-Capillary: 141 mg/dL — ABNORMAL HIGH (ref 70–99)

## 2019-12-22 MED ORDER — OLANZAPINE 2.5 MG PO TABS
7.5000 mg | ORAL_TABLET | Freq: Every day | ORAL | Status: DC
  Administered 2019-12-23 – 2019-12-28 (×7): 7.5 mg via ORAL
  Filled 2019-12-22: qty 3
  Filled 2019-12-22: qty 1
  Filled 2019-12-22 (×7): qty 3
  Filled 2019-12-22: qty 1

## 2019-12-22 MED ORDER — QUETIAPINE FUMARATE 100 MG PO TABS
200.0000 mg | ORAL_TABLET | Freq: Two times a day (BID) | ORAL | Status: DC
  Administered 2019-12-22 – 2020-01-06 (×30): 200 mg
  Filled 2019-12-22 (×30): qty 2

## 2019-12-22 NOTE — Consult Note (Signed)
  New consult placed for medication recommendations. Chart review shows patient was reassessed by psychiatry and medication adjustments were recommended. D/w ordering physician about yesterdays consult, he is requesting that medications be adjusted as patient remains agitated. Patient currently taking Seroqeul (700 mg daily), will decrease Seroquel 200mg  po BID and goal is to continue to taper off. Will start zyprexa 7.5mg  po qhs to target agitation, aggression and schizophrenia. Patient with complicated length of stay due to multiple conditions that include respiratory failure s/p trach, pneumococcal bacteremia, septic shock, schizophrenia, and bipolar. Haldol 2mg  IV q6 prn is ordered, however patient is only receiving 2 doses daily since order was placed. Patient has history of agitation and combativeness towards staff, and is well known to our service  WIll continue to consult patient 1-2 times a week for medication management, next consult due 12/24/2019 as medication adjustments were placed today.

## 2019-12-22 NOTE — Progress Notes (Signed)
PROGRESS NOTE  Susan [name]  [id number] DOB: Jul 02, 1961 DOA: 11/11/2019 PCP: Hoyt Koch, MD   Brief Narrative: Susan [name] is a 59 year old female with a history of HIV, diabetes with neuropathy, seizure disorder, tobacco use, bipolar disease, schizophrenia, history of IV drug abuse admitted 3/10 to ICU with overdose in shock with aspiration pneumonia with MRSA, pneumococcal bacteremia, toxic metabolic encephalopathy, septic shock and acute versus subacute bilateral MCA infarcts. Patient was intubated on 3/10, difficult to wean off, tracheostomy placed on 3/29. Was treated for MRSA pneumonia and massive volume overload with lasix infusion. Decreased movement and mentation was noted and head CT on 3/22 showed bilateral acute vs. subacute MCA infarcts confirmed on MRI 3/23. [facility] assumed care on 12/06/2019. Disposition is complicated by persistent encephalopathy requiring restraints to protect life-sustaining medical devices including trach and PEG. Psychiatry has been consulted for assistance with medication management.  Assessment & Plan: Principal Problem:   Acute respiratory failure with hypoxemia (HCC) Active Problems:   HIV disease (HCC)   Schizoaffective disorder, bipolar type ([place])   Drug-induced hypotension   DNR (do not resuscitate) discussion   AKI (acute kidney injury) ([place])   Pneumococcal bacteremia   Pneumonia   Aortic valve endocarditis   Acute kidney injury (AKI) with acute tubular necrosis (ATN) (HCC)   Acute metabolic encephalopathy   Cerebral embolism with cerebral infarction   Status post tracheostomy [facility])   Palliative care by specialist   Adult failure to thrive  Acute hypoxic/hypercapnic respiratory failure with ARDS from MRSA pneumonia and aspiration pneumonia, history of asthma: s/p ETT 3/10, tracheostomy 3/29. - PCCM following for tracheostomy care, currently #6 cuffless (since 4/12) undergoing capping trials/tolerating PMV. PCCM  following 1-2x/wk, considering decannulation soon. - Scheduled and prn BDs  Acute respiratory distress: Possibly precipitated by mucous plugging. Appears to be improved.  - Continue close monitoring.   s/p PEG: Site with slough without significant erythema on exam this morning, no tenderness.  - Continue wound management, does not currently require antibiotics.   Aerococcus bacteremia 3/25 Klebsiella bronchitis/pneumonia 3/25 - s/p cefepime x7 days on 4/1  Acute large bilateral posterior MCA infarcts 3/23: TEE 3/16 without CES. MRI brain c/w b/l posterior MCA territory infarcts; MRA/ MRV neg - Continue ASA, statin.  - Needs continued PT/OT/SLP  Acute metabolic encephalopathy/hospital delirium: Large CVAs causing brain injury and complicated by preexisting schizophrenia and bipolar disorder.  - Continued home medications including seroquel, cogentin, zoloft, buspar, doxepin, neurontin. Seroquel increased with modest improvement, still requiring soft restraints. Psychiatry has been consulted for assistance with medication management. Please note the patient is not on abilify. Repeat consultation has been requested.  Acute systolic heart failure with sepsis induced CM, HTN: No longer requiring regular diuresis. Daily weights showing slow decline. - Currently on clonidine, amlodipine, triamterene/HCTZ, metoprolol  HLD:  - Continue crestor  Poorly-controlled T2DM: HbA1c 12.3%.  - Continue basal-bolus insulin/CBGs. At inpatient goal.  Hyponatremia: Improving. - Decreased free water, monitor BMP  Hypokalemia: Supplemented.  HIV: CD4 440, VL 20 in March 2021. - Continue ART  At risk malnutrition: S/p PEG placement on 4/5, by IR - Continue with tube feeds as tolerated.  Obesity: Body mass index is 34.56 kg/m.  - Lifestyle modification advised once able to participate in conversation  GOC: Very poor prognosis due to multiple comorbidities - Palliative consulted and had  been following  DVT prophylaxis: SCD's Code Status: Full Family Communication: Daughter by phone previously, none today. Status is: Inpatient  Remains inpatient appropriate  because:Unsafe d/c plan as pt still requires restraints for her own safety  Dispo: The patient is from: Home  Anticipated d/c is to: SNF  Anticipated d/c date is: > 3 days  Patient currently is not medically stable to d/c. Requires control of delirium enough to no longer require restraints.  Consultants:   PCCM  ID  Neurology  Procedures:   Mechanical ventilation  TEE  Trach changed to cuffless 4/12  Subjective: Pt not agitated this AM but not oriented, saying she's got to get out of here but can't tell me where we are or where she wants to go. Denies any pain.  Objective: Vitals:   12/22/19 0733 12/22/19 0755 12/22/19 1118 12/22/19 1154  BP: 111/65 117/64  115/67  Pulse: 83 80 78 79  Resp: 17 15 20  (!) 24  Temp:  98.3 F (36.8 C)  97.9 F (36.6 C)  TempSrc:  Oral  Oral  SpO2: 99% 100% 100% 99%  Weight:      Height:        Intake/Output Summary (Last 24 hours) at 12/22/2019 1322 Last data filed at 12/22/2019 [id number] Gross per 24 hour  Intake 80 ml  Output 1000 ml  Net -920 ml   Filed Weights   12/20/19 0413 12/21/19 0347 12/22/19 0400  Weight: 91.5 kg 90 kg 88.5 kg   Gen: 59 y.o. female in no distress, resting quietly Pulm: Nonlabored breathing, no wheezes or crackles. CV: Regular rate and rhythm. No murmur, rub, or gallop. No JVD, no dependent edema. GI: Abdomen soft, non-tender, obese but non-distended, with normoactive bowel sounds.  Ext: Warm, no deformities Skin: PEG site with slough, underneath is healthy wound edges without erythema, induration or tenderness. Thick tan secretions around trach site. No other rashes, lesions or ulcers on visualized skin. Neuro: Rousable, tracks, not oriented, interactive. Moves all extremities  Psych:  Judgement and insight appear impaired. Mood euthymic & affect congruent. Behavior is appropriate.  No agitation at the time of my exam  Data Reviewed: I have personally reviewed following labs and imaging studies  CBC: Recent Labs  Lab 12/16/19 0657 12/17/19 0500 12/18/19 1611 12/19/19 0621 12/21/19 0425  WBC 11.1* 11.2* 10.9* 10.8* 11.1*  NEUTROABS 6.8 6.6 6.5 6.3  --   HGB 11.3* 11.2* 11.2* 11.0* 10.7*  HCT 36.5 36.3 36.0 34.6* 34.1*  MCV 88.8 90.1 89.8 88.7 89.0  PLT 266 241 260 253 [id number]   Basic Metabolic Panel: Recent Labs  Lab 12/16/19 0657 12/20/19 0611 12/21/19 0425 12/22/19 0500  NA 133* 134* 133* 136  K 3.6 3.4* 3.8 3.6  CL 88* 87* 87* 91*  CO2 33* 35* 34* 34*  GLUCOSE 161* 158* 247* 119*  BUN 38* 39* 41* 42*  CREATININE 1.04* 1.04* 1.16* 1.16*  CALCIUM 9.3 9.5 9.4 9.5   GFR: Estimated Creatinine Clearance: 56.4 mL/min (A) (by C-G formula based on SCr of 1.16 mg/dL (H)). Liver Function Tests: Recent Labs  Lab 12/16/19 0657 12/20/19 0611  AST 23 26  ALT 20 27  ALKPHOS 110 110  BILITOT 0.5 0.2*  PROT 7.7 8.3*  ALBUMIN 2.3* 2.2*   No results for input(s): LIPASE, AMYLASE in the last 168 hours. No results for input(s): AMMONIA in the last 168 hours. Coagulation Profile: No results for input(s): INR, PROTIME in the last 168 hours. Cardiac Enzymes: No results for input(s): CKTOTAL, CKMB, CKMBINDEX, TROPONINI in the last 168 hours. BNP (last 3 results) No results for input(s): PROBNP in the last 8760 hours. HbA1C:  No results for input(s): HGBA1C in the last 72 hours. CBG: Recent Labs  Lab 12/21/19 2041 12/21/19 2350 12/22/19 0627 12/22/19 0813 12/22/19 1207  GLUCAP 182* 162* 167* 186* 156*   Lipid Profile: No results for input(s): CHOL, HDL, LDLCALC, TRIG, CHOLHDL, LDLDIRECT in the last 72 hours. Thyroid Function Tests: No results for input(s): TSH, T4TOTAL, FREET4, T3FREE, THYROIDAB in the last 72 hours. Anemia Panel: No results for  input(s): VITAMINB12, FOLATE, FERRITIN, TIBC, IRON, RETICCTPCT in the last 72 hours. Urine analysis:    Component Value Date/Time   COLORURINE YELLOW 11/26/2019 1522   APPEARANCEUR CLEAR 11/26/2019 1522   LABSPEC 1.011 11/26/2019 1522   PHURINE 8.0 11/26/2019 1522   GLUCOSEU NEGATIVE 11/26/2019 1522   HGBUR NEGATIVE 11/26/2019 1522   BILIRUBINUR NEGATIVE 11/26/2019 1522   KETONESUR NEGATIVE 11/26/2019 1522   PROTEINUR 30 (A) 11/26/2019 1522   UROBILINOGEN 1.0 06/01/2015 0104   NITRITE NEGATIVE 11/26/2019 1522   LEUKOCYTESUR NEGATIVE 11/26/2019 1522   No results found for this or any previous visit (from the past 240 hour(s)).    Radiology Studies: No results found.  Scheduled Meds: . amLODipine  10 mg Per Tube Daily  . aspirin  81 mg Per Tube Daily  . benztropine  0.5 mg Per Tube Daily  . budesonide (PULMICORT) nebulizer solution  0.5 mg Nebulization BID  . busPIRone  15 mg Per Tube TID  . chlorhexidine  15 mL Mouth Rinse BID  . Chlorhexidine Gluconate Cloth  6 each Topical Daily  . cloNIDine  0.2 mg Per Tube TID  . dolutegravir  50 mg Oral Daily  . doxepin  50 mg Per Tube QHS  . emtricitabine-tenofovir AF  1 tablet Per Tube Daily  . enoxaparin (LOVENOX) injection  40 mg Subcutaneous Q24H  . feeding supplement (PRO-STAT SUGAR FREE 64)  30 mL Per Tube TID  . free water  200 mL Per Tube Q6H  . gabapentin  300 mg Per Tube Q8H  . insulin aspart  0-20 Units Subcutaneous Q4H  . insulin aspart  5 Units Subcutaneous Q4H  . insulin glargine  10 Units Subcutaneous BID  . mouth rinse  15 mL Mouth Rinse q12n4p  . metoprolol tartrate  12.5 mg Per Tube BID  . pantoprazole sodium  40 mg Per Tube Daily  . polyethylene glycol  17 g Oral Daily  . QUEtiapine  200 mg Per Tube Daily  . QUEtiapine  500 mg Per Tube QHS  . rosuvastatin  20 mg Per Tube q1800  . sennosides  15 mL Per Tube BID  . sertraline  50 mg Per Tube Daily  . triamterene-hydrochlorothiazide  1 tablet Per Tube Daily    Continuous Infusions: . sodium chloride Stopped (12/07/19 0216)  . feeding supplement (JEVITY 1.2 CAL) 1,000 mL (12/21/19 2135)     LOS: 41 days   Time spent: 25 minutes.  Patrecia Pour, MD [facility] [email or web address] 12/22/2019, 1:22 PM

## 2019-12-22 NOTE — Progress Notes (Signed)
Nutrition Follow-up  DOCUMENTATION CODES:   Obesity unspecified  INTERVENTION:   Continue Jevity 1.2 @ 50 ml/hr via PEG   30 ml Prostat TID  200 ml free water flush every 4 hours  Regimen provides 1740 kcal, 112 gm protein, 972 ml free water daily, meeting 100% of estimated kcal and protein needs  Continue current free water flushes 200 ml every 4 hours for total free water intake = 2172 ml per day.  NUTRITION DIAGNOSIS:   Inadequate oral intake related to inability to eat as evidenced by NPO status.  Ongoing  GOAL:   Patient will meet greater than or equal to 90% of their needs  Met with TF  MONITOR:   TF tolerance, Skin, Labs  REASON FOR ASSESSMENT:   Ventilator, Consult Enteral/tube feeding initiation and management  ASSESSMENT:   59 yo female admitted S/P fall at home, unresponsive, hypotensive. Intubated by EMS. Suspected overdose with toxic/metabolic encephalopathy and aspiration PNA. PMH includes schizophrenia, HIV, DM, neuropathy, HTN.  3/22 - CT head positive for stroke 3/23 -MRI revealed bilateral acute infarcts in the posterior MCA territory 3/29 - S/P trach 4/05 - S/P PEG 4/6- cortrak removed 4/15- s/p MBSS- recommend continue NPO with trial of nectar and puree with SLP  Reviewed I/O's: -420 ml x 24 hours and +2 L since 12/08/19  UOP: 500 ml x 24 hours  Trach changed from #6 shiley cuffed trach to #6 cuffless on 12/14/19. She remains on trach collar.   Pt more alert and interactive today in comparison to prior RD visit. She responded to touch and woke and greeted this RD. Pt a little confused initially, stating "I'm leaving here and going to [place]; I want you to come too so you can visit me". RD re-oriented pt to place and situation.   Pt remains NPO, with trials of puree and nectar thick liquids when SLP present. Pt shares that she really wants to eat and is requesting chocolate pudding. RD provided emotional support to pt and explained  rationale for NPO status. RD discussed how pt was continuing to receive TF via PEG for nutrition until able to eat safely and adequately. Pt very accepting of explanation, but desires at eat PO as soon as possible.   Plan to d/c to SNF once medically stable.   Labs reviewed: CBGS: 162-186 (inpatient orders for glycemic control are 0-20 units insulin aspart every 4 hours, and 5 units insulin aspart every 4 hours, and 10 units insulin glargine BID).   NUTRITION - FOCUSED PHYSICAL EXAM:    Most Recent Value  Orbital Region  No depletion  Upper Arm Region  No depletion  Thoracic and Lumbar Region  No depletion  Buccal Region  No depletion  Temple Region  Mild depletion  Clavicle Bone Region  No depletion  Clavicle and Acromion Bone Region  No depletion  Scapular Bone Region  No depletion  Dorsal Hand  No depletion  Patellar Region  No depletion  Anterior Thigh Region  No depletion  Posterior Calf Region  No depletion  Edema (RD Assessment)  Mild  Hair  Reviewed  Eyes  Reviewed  Mouth  Reviewed  Skin  Reviewed  Nails  Reviewed       Diet Order:   Diet Order            Diet NPO time specified  Diet effective now              EDUCATION NEEDS:   Not appropriate for education  at this time  Skin:  Skin Assessment: Skin Integrity Issues: Skin Integrity Issues:: Other (Comment) Other: non-pressure wound buttocks  Last BM:  12/21/19  Height:   Ht Readings from Last 1 Encounters:  11/17/19 5' 3"  (1.6 m)    Weight:   Wt Readings from Last 1 Encounters:  12/22/19 88.5 kg    Ideal Body Weight:  52.3 kg  BMI:  Body mass index is 34.56 kg/m.  Estimated Nutritional Needs:   Kcal:  1700-1900  Protein:  105-130 gm  Fluid:  >/= 1.7 L    Loistine Chance, RD, LDN, [place] Registered Dietitian II Certified Diabetes Care and Education Specialist Please refer to [facility] for RD and/or RD on-call/weekend/after hours pager

## 2019-12-22 NOTE — Progress Notes (Signed)
Physical Therapy Treatment Patient Details Name: Susan [name] MRN: [id number] DOB: 07-25-1961 Today's Date: 12/22/2019    History of Present Illness 59 year old female smoker admitted 3/10 with overdose complicated by aspiration PNA with MRSA,  Pneumococcal bacteremia, toxic metabolic encephalopathy and septic shock.  Frequent falls at home,Tobacco abuse, diabetes with painful polyneuropathy, bipolar disease, GERD, HIV, schizophrenia, history of IV drug abuse, seizure disorder. Intubated 3/10-3/29 3/23 MRI brain >>Acute nonhemorrhagic infarcts in the posterior MCA territory bilaterally. Tracheostomy placed 3/29    PT Comments    Pt pleasant and dancing throughout session today, PT and OT saw pt together to progress pt mobility OOB. Pt required mod-max +2 for bed mobility and transfer to and from [facility], pt following commands when given concise commands with direct eye contact. Pt tolerated repeated transfers at EOB and at [facility] for pericare, pt at one point expressing a fear of falling and attempting to sit prior to arrival back to bed but otherwise performed transfers well. PT to continue to follow acutely, will progress mobility as able.    Follow Up Recommendations  SNF     Equipment Recommendations  None recommended by PT    Recommendations for Other Services       Precautions / Restrictions Precautions Precautions: Fall Precaution Comments: bilateral mitts and UE restraints. PEG. Trach collar 5L at 28%FiO2 Restrictions Weight Bearing Restrictions: No    Mobility  Bed Mobility Overal bed mobility: Needs Assistance Bed Mobility: Supine to Sit;Rolling;Sit to Supine Rolling: Max assist;+2 for safety/equipment   Supine to sit: Max assist;+2 for physical assistance;+2 for safety/equipment;HOB elevated Sit to supine: Max assist;+2 for physical assistance;+2 for safety/equipment   General bed mobility comments: max assist +2 for rolling bilaterally for placement of clean pads,  helicopter method with bed pads to get to EOB due to pt's difficulty with command following during bed mobility.  Transfers Overall transfer level: Needs assistance Equipment used: 2 person hand held assist;Rolling walker (2 wheeled) Transfers: Sit to/from [facility] Sit to Stand: Min assist;+2 safety/equipment;+2 physical assistance;From elevated surface Stand pivot transfers: Mod assist;+2 physical assistance;+2 safety/equipment;From elevated surface       General transfer comment: when following commands, min +2 for sit to stand for initial power up and steadying upon standing. Verbal cuing "stand up" most effective this day. Sit to stand x4, from EOB x2 and from [facility] x2. Stand pivot with mod+2 to and from [facility] for steadying, guiding pt hips to destination surface, and physically weightshifting pt as needed in order to progress to destination surface.  Ambulation/Gait             General Gait Details: unable this day   Stairs             Wheelchair Mobility    Modified Rankin (Stroke Patients Only) Modified Rankin (Stroke Patients Only) Pre-Morbid Rankin Score: No symptoms Modified Rankin: Severe disability     Balance Overall balance assessment: Needs assistance Sitting-balance support: Feet supported;Bilateral upper extremity supported Sitting balance-Leahy Scale: Fair Sitting balance - Comments: Sat EOB x10 minutes total with min guard assist, pt with occasional posterior leaning corrected with light assist.   Standing balance support: Bilateral upper extremity supported;During functional activity Standing balance-Leahy Scale: Poor Standing balance comment: reliant on external assist, either HHA +2 or RW                            Cognition Arousal/Alertness: Awake/alert Behavior During Therapy: Flat  affect;Impulsive;Restless Overall Cognitive Status: Impaired/Different from baseline Area of Impairment:  Orientation;Attention;Memory;Following commands;Safety/judgement;Awareness;Problem solving                 Orientation Level: Disoriented to;Place;Time;Situation;Person Current Attention Level: Focused Memory: Decreased short-term memory;Decreased recall of precautions Following Commands: Follows one step commands with increased time;Follows one step commands inconsistently Safety/Judgement: Decreased awareness of safety;Decreased awareness of deficits Awareness: Intellectual Problem Solving: Decreased initiation;Difficulty sequencing;Requires verbal cues;Requires tactile cues;Slow processing General Comments: Pt pleasant, dancing throughout session this day. Pt answers to "[place]" but is not able to state name when asked. Pt impulsive and moves without warning at times, responds best to verbal cues that are short and delivered with eye contact.      Exercises General Exercises - Lower Extremity Long Arc Quad: AROM;Both;5 reps;Seated Heel Slides: Both;AROM;10 reps;Supine    General Comments General comments (skin integrity, edema, etc.): vss      Pertinent Vitals/Pain Pain Assessment: Faces Faces Pain Scale: No hurt Pain Intervention(s): Monitored during session    Home Living                      Prior Function            PT Goals (current goals can now be found in the care plan section) Acute Rehab PT Goals PT Goal Formulation: Patient unable to participate in goal setting Time For Goal Achievement: 12/29/19 Potential to Achieve Goals: Fair Progress towards PT goals: Progressing toward goals    Frequency    Min 3X/week      PT Plan Current plan remains appropriate    Co-evaluation PT/OT/SLP Co-Evaluation/Treatment: Yes Reason for Co-Treatment: Complexity of the patient's impairments (multi-system involvement);Necessary to address cognition/behavior during functional activity;For patient/therapist safety;To address functional/ADL transfers PT goals  addressed during session: Mobility/safety with mobility;Proper use of DME;Balance;Strengthening/ROM        AM-PAC PT "6 Clicks" Mobility   Outcome Measure  Help needed turning from your back to your side while in a flat bed without using bedrails?: Total Help needed moving from lying on your back to sitting on the side of a flat bed without using bedrails?: Total Help needed moving to and from a bed to a chair (including a wheelchair)?: Total Help needed standing up from a chair using your arms (e.g., wheelchair or bedside chair)?: A Lot Help needed to walk in hospital room?: A Lot Help needed climbing 3-5 steps with a railing? : Total 6 Click Score: 8    End of Session Equipment Utilized During Treatment: Oxygen;Gait belt Activity Tolerance: Patient tolerated treatment well;Patient limited by fatigue(poor command following and impusivity) Patient left: in bed;with bed alarm set;with restraints reapplied;with nursing/sitter in room;with call bell/phone within reach Nurse Communication: Mobility status PT Visit Diagnosis: Unsteadiness on feet (R26.81);Other abnormalities of gait and mobility (R26.89);Repeated falls (R29.6);Muscle weakness (generalized) (M62.81);History of falling (Z91.81);Difficulty in walking, not elsewhere classified (R26.2);Hemiplegia and hemiparesis Hemiplegia - Right/Left: Left Hemiplegia - dominant/non-dominant: Non-dominant Hemiplegia - caused by: Cerebral infarction     Time: [id number] PT Time Calculation (min) (ACUTE ONLY): 43 min  Charges:  $Therapeutic Activity: 8-22 mins                     Verneal Wiers E, PT Acute Rehabilitation Services Pager [phone]  Office [phone]  Randal Yepiz D Ormand Senn 12/22/2019, 11:10 AM

## 2019-12-22 NOTE — Progress Notes (Signed)
Patient ID: Susan [name], female   DOB: 11/12/1960, 59 y.o.   MRN: [id number]  This NP visited patient at the bedside as a follow up for palliative medicine  needs and emotional support.  Patient is resting comfortably, awakens to verbal stimuli but is unable to communicate needs or follow simple commands curretnly  Spoke to daughter by phone/Susan [name] offered ongoing education regarding current medical situation; diagnosis, prognosis, treatment option decisions, GOCs and anticipatory care needs.   Daughter verbalizes optimism having seen her Mom over the weekend and "she was so much better that I was hearing"  Education offered regarding  her high risk for decompensation 2/2 to multiple co-morbid ites, she is high risk for infection (decreased mobility, trach).  Education offered regarding the limitations of medical interventions to prolong quality of life when a body begins to fail to thrive.  Discussed with daughter the importance of continued conversation with her family and the  medical providers regarding overall plan of care and treatment options,  ensuring decisions are within the context of the patients values and GOCs.  In person family meeting scheduled for Thursday at 1:00 pm with daughter/[name]  Questions and concerns addressed   Discussed with Dr Auburn Bilberry  Total time spent on the unit was 25 minutes  Greater than 50% of the time was spent in counseling and coordination of care  Susan Lessen NP  Palliative Medicine Team Team Phone # [phone] Pager [phone]

## 2019-12-22 NOTE — Progress Notes (Signed)
Occupational Therapy Treatment Patient Details Name: Susan [name] MRN: [id number] DOB: [birth year] Today's Date: 12/22/2019    History of present illness 59 year old female smoker admitted 3/10 with overdose complicated by aspiration PNA with MRSA,  Pneumococcal bacteremia, toxic metabolic encephalopathy and septic shock.  Frequent falls at home,Tobacco abuse, diabetes with painful polyneuropathy, bipolar disease, GERD, HIV, schizophrenia, history of IV drug abuse, seizure disorder. Intubated 3/10-3/29 3/23 MRI brain >>Acute nonhemorrhagic infarcts in the posterior MCA territory bilaterally. Tracheostomy placed 3/29   OT comments  Pt very pleasant and willing to participate in therapy session. Pt tolerating EOB activity as well as transfer to/from [facility] for toileting ADL. Pt requiring two person assist for safe completion of stand pivot transfers, requiring close supervision at all times as pt remains impulsive and intermittently attempting to stand on her own while seated on BSC. Pt tolerating x4 stands for completion of pericare, changing bed pads. She fatigues easily but overall tolerating session well. Pt requires consistent/concise statements in order to follow commands, doing so inconsistently throughout session. Feel SNF recommendation remains appropriate at this time. Will continue to follow while acutely admitted.    Follow Up Recommendations  SNF;Supervision/Assistance - 24 hour    Equipment Recommendations  Other (comment)(TBD)          Precautions / Restrictions Precautions Precautions: Fall Precaution Comments: bilateral mitts and UE restraints. PEG. Trach collar 5L at 28%FiO2 Restrictions Weight Bearing Restrictions: No       Mobility Bed Mobility Overal bed mobility: Needs Assistance Bed Mobility: Supine to Sit;Rolling;Sit to Supine Rolling: Max assist;+2 for safety/equipment   Supine to sit: Max assist;+2 for physical assistance;+2 for safety/equipment;HOB  elevated Sit to supine: Max assist;+2 for physical assistance;+2 for safety/equipment   General bed mobility comments: max assist +2 for rolling bilaterally for placement of clean pads, helicopter method with bed pads to get to EOB due to pt's difficulty with command following during bed mobility.  Transfers Overall transfer level: Needs assistance Equipment used: 2 person hand held assist;Rolling walker (2 wheeled) Transfers: Sit to/from [facility] Sit to Stand: Min assist;+2 safety/equipment;+2 physical assistance;From elevated surface Stand pivot transfers: Mod assist;+2 physical assistance;+2 safety/equipment;From elevated surface       General transfer comment: when following commands, min +2 for sit to stand for initial power up and steadying upon standing. Verbal cuing "stand up" most effective this day. Sit to stand x4, from EOB x2 and from [facility] x2. Stand pivot with mod+2 to and from [facility] for steadying, guiding pt hips to destination surface, and physically weightshifting pt as needed in order to progress to destination surface.    Balance Overall balance assessment: Needs assistance Sitting-balance support: Feet supported;Bilateral upper extremity supported Sitting balance-Leahy Scale: Fair Sitting balance - Comments: Sat EOB x10 minutes total with min guard assist, pt with occasional posterior leaning corrected with light assist.   Standing balance support: Bilateral upper extremity supported;During functional activity Standing balance-Leahy Scale: Poor Standing balance comment: reliant on external assist, either HHA +2 or RW                           ADL either performed or assessed with clinical judgement   ADL Overall ADL's : Needs assistance/impaired Eating/Feeding: NPO               Upper Body Dressing : Maximal assistance;Sitting Upper Body Dressing Details (indicate cue type and reason): donning new gown     Toilet Transfer:  Moderate assistance;+2 for physical assistance;Stand-pivot;BSC   Toileting- Clothing Manipulation and Hygiene: Total assistance;+2 for physical assistance;Sit to/from stand;Bed level Toileting - Clothing Manipulation Details (indicate cue type and reason): assist for pericare after BM     Functional mobility during ADLs: Moderate assistance;+2 for physical assistance(stand pivot transfer) General ADL Comments: pt able to progress to functional transfer to [facility] during this session for BM                       Cognition Arousal/Alertness: Awake/alert Behavior During Therapy: Flat affect;Impulsive;Restless Overall Cognitive Status: Impaired/Different from baseline Area of Impairment: Orientation;Attention;Memory;Following commands;Safety/judgement;Awareness;Problem solving                 Orientation Level: Disoriented to;Place;Time;Situation;Person Current Attention Level: Focused Memory: Decreased short-term memory;Decreased recall of precautions Following Commands: Follows one step commands with increased time;Follows one step commands inconsistently Safety/Judgement: Decreased awareness of safety;Decreased awareness of deficits Awareness: Intellectual Problem Solving: Decreased initiation;Difficulty sequencing;Requires verbal cues;Requires tactile cues;Slow processing General Comments: Pt pleasant, dancing throughout session this day. Pt answers to "Susan [name]" but is not able to state name when asked. Pt impulsive and moves without warning at times, responds best to verbal cues that are short and delivered with eye contact.        Exercises     Shoulder Instructions       General Comments VSS    Pertinent Vitals/ Pain       Pain Assessment: Faces Faces Pain Scale: No hurt Pain Intervention(s): Monitored during session  Home Living                                          Prior Functioning/Environment              Frequency  Min 2X/week         Progress Toward Goals  OT Goals(current goals can now be found in the care plan section)  Progress towards OT goals: Progressing toward goals  Acute Rehab OT Goals Patient Stated Goal: to eat OT Goal Formulation: With patient Time For Goal Achievement: 01/05/20 Potential to Achieve Goals: Good  Plan Discharge plan remains appropriate    Co-evaluation    PT/OT/SLP Co-Evaluation/Treatment: Yes Reason for Co-Treatment: Complexity of the patient's impairments (multi-system involvement);Necessary to address cognition/behavior during functional activity;For patient/therapist safety;To address functional/ADL transfers   OT goals addressed during session: ADL's and self-care      AM-PAC OT "6 Clicks" Daily Activity     Outcome Measure   Help from another person eating meals?: Total Help from another person taking care of personal grooming?: A Lot Help from another person toileting, which includes using toliet, bedpan, or urinal?: Total Help from another person bathing (including washing, rinsing, drying)?: Total Help from another person to put on and taking off regular upper body clothing?: A Lot Help from another person to put on and taking off regular lower body clothing?: Total 6 Click Score: 8    End of Session Equipment Utilized During Treatment: Gait belt;Oxygen  OT Visit Diagnosis: Other symptoms and signs involving cognitive function;Muscle weakness (generalized) (M62.81);Unsteadiness on feet (R26.81);Other abnormalities of gait and mobility (R26.89) Hemiplegia - Right/Left: Left Hemiplegia - caused by: Cerebral infarction   Activity Tolerance Patient tolerated treatment well   Patient Left in bed;with call bell/phone within reach;with restraints reapplied;with nursing/sitter in room;with bed alarm set   Nurse  Communication Mobility status        Time: [id number] OT Time Calculation (min): 43 min  Charges: OT General Charges $OT Visit: 1 Visit OT  Treatments $Self Care/Home Management : 23-37 mins  Lou Cal, OT Acute Rehabilitation Services Pager [phone] Office [phone]    Raymondo Band 12/22/2019, 3:11 PM

## 2019-12-23 DIAGNOSIS — J9601 Acute respiratory failure with hypoxia: Secondary | ICD-10-CM | POA: Diagnosis not present

## 2019-12-23 LAB — GLUCOSE, CAPILLARY
Glucose-Capillary: 112 mg/dL — ABNORMAL HIGH (ref 70–99)
Glucose-Capillary: 118 mg/dL — ABNORMAL HIGH (ref 70–99)
Glucose-Capillary: 127 mg/dL — ABNORMAL HIGH (ref 70–99)
Glucose-Capillary: 146 mg/dL — ABNORMAL HIGH (ref 70–99)
Glucose-Capillary: 177 mg/dL — ABNORMAL HIGH (ref 70–99)
Glucose-Capillary: 98 mg/dL (ref 70–99)

## 2019-12-23 LAB — CBC
HCT: 34.5 % — ABNORMAL LOW (ref 36.0–46.0)
Hemoglobin: 10.8 g/dL — ABNORMAL LOW (ref 12.0–15.0)
MCH: 27.8 pg (ref 26.0–34.0)
MCHC: 31.3 g/dL (ref 30.0–36.0)
MCV: 88.7 fL (ref 80.0–100.0)
Platelets: 335 10*3/uL (ref 150–400)
RBC: 3.89 MIL/uL (ref 3.87–5.11)
RDW: 17 % — ABNORMAL HIGH (ref 11.5–15.5)
WBC: 10.7 10*3/uL — ABNORMAL HIGH (ref 4.0–10.5)
nRBC: 0 % (ref 0.0–0.2)

## 2019-12-23 LAB — BASIC METABOLIC PANEL
Anion gap: 11 (ref 5–15)
BUN: 45 mg/dL — ABNORMAL HIGH (ref 6–20)
CO2: 36 mmol/L — ABNORMAL HIGH (ref 22–32)
Calcium: 9.6 mg/dL (ref 8.9–10.3)
Chloride: 90 mmol/L — ABNORMAL LOW (ref 98–111)
Creatinine, Ser: 1.08 mg/dL — ABNORMAL HIGH (ref 0.44–1.00)
GFR calc Af Amer: >60 mL/min (ref >60)
GFR calc non Af Amer: 57 mL/min — ABNORMAL LOW (ref >60)
Glucose, Bld: 109 mg/dL — ABNORMAL HIGH (ref 70–99)
Potassium: 3.8 mmol/L (ref 3.5–5.1)
Sodium: 137 mmol/L (ref 135–145)

## 2019-12-23 NOTE — Progress Notes (Signed)
  Speech Language Pathology Treatment: Dysphagia;[place] Speaking valve  Patient Details Name: Susan [name] MRN: [id number] DOB: 09-Nov-1960 Today's Date: 12/23/2019 Time: [id number] SLP Time Calculation (min) (ACUTE ONLY): 15 min  Assessment / Plan / Recommendation Clinical Impression  Goal of session included consumption of po's and use of PMV for possible advancement to order for po's (tray 3 times a day). [name] was cooperative but very distracted looking at people in the hall, mistaking them for someone else. Mostly redirectable with cues. She consumed 5 bites chocolate pudding and 2 teaspoon trials nectar thick (declined additional sips nectar which has been her pattern to take limited amount during trials- SLP can work toward increasing pt's interest during po trials and possibly working with RD to adjust feeds to increasing hunger. Pt's psych history influences her intake). Pt was reluctant to allow therapist to apply PMV today perhaps relating to increased wheezing noted and slightly increased work of breathing. PMV blown off trach hub after a few seconds.     HPI HPI: 59 year old female smoker admitted 3/10 with overdose complicated by aspiration PNA with MRSA,  Pneumococcal bacteremia, toxic metabolic encephalopathy and septic shock. MRI 3/23 revealed Acute nonhemorrhagic infarcts in the posterior MCA territory bilaterally. Pt recieved PEG and has not been appropriate for BSE until present.        SLP Plan  Continue with current plan of care;Other (Comment)       Recommendations  Diet recommendations: (trial with SLP) Medication Administration: Via alternative means      Patient may use Passy-Muir Speech Valve: During all therapies with supervision PMSV Supervision: Full MD: Please consider changing trach tube to : Cuffless;Smaller size         Oral Care Recommendations: Oral care QID Follow up Recommendations: Skilled Nursing facility;LTACH SLP Visit Diagnosis: Aphonia  (R49.1);Dysphagia, pharyngeal phase (R13.13) Plan: Continue with current plan of care;Other (Comment)                       Susan [name] 12/23/2019, 6:48 AM  Susan [name] Risk analyst [phone] Office [phone]

## 2019-12-23 NOTE — Progress Notes (Signed)
PROGRESS NOTE  Susan [name]  [id number] DOB: 24-Jun-1961 DOA: 11/11/2019 PCP: Hoyt Koch, MD   Brief Narrative: Susan [name] is a 59 year old female with a history of HIV, diabetes with neuropathy, seizure disorder, tobacco use, bipolar disease, schizophrenia, history of IV drug abuse admitted 3/10 to ICU with overdose in shock with aspiration pneumonia with MRSA, pneumococcal bacteremia, toxic metabolic encephalopathy, septic shock and acute versus subacute bilateral MCA infarcts. Patient was intubated on 3/10, difficult to wean off, tracheostomy placed on 3/29. Was treated for MRSA pneumonia and massive volume overload with lasix infusion. Decreased movement and mentation was noted and head CT on 3/22 showed bilateral acute vs. subacute MCA infarcts confirmed on MRI 3/23. [facility] assumed care on 12/06/2019. Disposition is complicated by persistent encephalopathy requiring restraints to protect life-sustaining medical devices including trach and PEG. Psychiatry has been consulted for assistance with medication management.  Assessment & Plan: Principal Problem:   Acute respiratory failure with hypoxemia (HCC) Active Problems:   HIV disease (HCC)   Schizoaffective disorder, bipolar type ([place])   Drug-induced hypotension   DNR (do not resuscitate) discussion   AKI (acute kidney injury) ([place])   Pneumococcal bacteremia   Pneumonia   Aortic valve endocarditis   Acute kidney injury (AKI) with acute tubular necrosis (ATN) (HCC)   Acute metabolic encephalopathy   Cerebral embolism with cerebral infarction   Status post tracheostomy [facility])   Palliative care by specialist   Adult failure to thrive  Acute hypoxic/hypercapnic respiratory failure with ARDS from MRSA pneumonia and aspiration pneumonia, history of asthma:  - s/p ETT 3/10, tracheostomy 3/29. - PCCM following for tracheostomy care, currently #6 cuffless (since 4/12) undergoing capping trials/tolerating PMV. PCCM  following 1-2x/wk, considering decannulation soon. - Scheduled and prn BDs  Acute respiratory distress: Possibly precipitated by mucous plugging. Appears to be improved.  - Continue close monitoring.   s/p PEG tube placement:  - Continue wound management, does not currently require antibiotics.   Aerococcus bacteremia 3/25 Klebsiella bronchitis/pneumonia 3/25 - s/p cefepime x7 days on 4/1  Acute large bilateral posterior MCA infarcts 3/23: TEE 3/16 without CES. MRI brain c/w b/l posterior MCA territory infarcts; MRA/ MRV neg - Continue ASA, statin.  - Needs continued PT/OT/SLP  Acute metabolic encephalopathy/hospital delirium: Large CVAs causing brain injury and complicated by preexisting schizophrenia and bipolar disorder.  - Home medications include seroquel, cogentin, zoloft, buspar, doxepin, neurontin. Seroquel increased with modest improvement, still requiring soft restraints. - Psychiatry following 1-2 times per week, recommending downward titration of Seroquel with new addition of Zyprexa.  Acute systolic heart failure with sepsis induced CM, HTN: Following, no longer requiring regular diuresis.  - Currently on clonidine, amlodipine, triamterene/HCTZ, metoprolol  HLD:  - Continue crestor  Poorly-controlled T2DM: HbA1c 12.3%.  - Continue basal-bolus insulin/CBGs. At inpatient goal.  Hyponatremia: Improving. - Decreased free water, monitor BMP  Hypokalemia: Supplemented.  HIV: CD4 440, VL 20 in March 2021. - Continue ART  At risk malnutrition: S/p PEG placement on 4/5, by IR - Continue with tube feeds as tolerated.  Obesity: Body mass index is 34.52 kg/m.  - Lifestyle modification advised once able to participate in conversation  GOC: Very poor prognosis due to multiple comorbidities - Palliative consulted and had been following  DVT prophylaxis: SCD's Code Status: Full Family Communication: Daughter by phone previously, none today. Status:  Inpatient  Remains inpatient appropriate because:Unsafe d/c plan as pt still requires restraints for her own safety  Dispo: The patient is from:  Home  Anticipated d/c is to: SNF  Anticipated d/c date is: > 3 days  Patient currently is not medically stable to d/c. Requires control of delirium enough to no longer require restraints.  Consultants:   PCCM  ID  Neurology  Procedures:   Mechanical ventilation  TEE  Trach changed to cuffless 4/12  Subjective: Patient remains on oriented, no acute issues or events overnight.  Review of systems markedly limited given patient's current baseline.  Objective: Vitals:   12/23/19 0032 12/23/19 0355 12/23/19 0446 12/23/19 0500  BP: (!) 111/95 122/60    Pulse:   77   Resp: 16 17 19    Temp: 98.9 F (37.2 C) 98.1 F (36.7 C)    TempSrc: Oral Axillary    SpO2: 100% 98% 100%   Weight:    88.4 kg  Height:        Intake/Output Summary (Last 24 hours) at 12/23/2019 0657 Last data filed at 12/23/2019 0035 Gross per 24 hour  Intake 2103.33 ml  Output 1150 ml  Net 953.33 ml   Filed Weights   12/21/19 0347 12/22/19 0400 12/23/19 0500  Weight: 90 kg 88.5 kg 88.4 kg   Gen: 59 y.o. female in no distress, resting quietly Pulm: Nonlabored breathing, no wheezes or crackles. CV: Regular rate and rhythm. No murmur, rub, or gallop. No JVD, no dependent edema. GI: Abdomen soft, non-tender, obese but non-distended, with normoactive bowel sounds.  Ext: Warm, no deformities or edema Skin: PEG site with slough, underneath is healthy wound edges without erythema, induration or tenderness. Thick tan secretions around trach site. No other rashes, lesions or ulcers on visualized skin. Neuro: Rousable, tracks, not oriented, interactive. Moves all extremities   Data Reviewed: I have personally reviewed following labs and imaging studies  CBC: Recent Labs  Lab 12/17/19 0500 12/18/19 1611 12/19/19 0621  12/21/19 0425 12/23/19 0500  WBC 11.2* 10.9* 10.8* 11.1* 10.7*  NEUTROABS 6.6 6.5 6.3  --   --   HGB 11.2* 11.2* 11.0* 10.7* 10.8*  HCT 36.3 36.0 34.6* 34.1* 34.5*  MCV 90.1 89.8 88.7 89.0 88.7  PLT 241 260 253 [id number]   Basic Metabolic Panel: Recent Labs  Lab 12/20/19 0611 12/21/19 0425 12/22/19 0500 12/23/19 0500  NA 134* 133* 136 137  K 3.4* 3.8 3.6 3.8  CL 87* 87* 91* 90*  CO2 35* 34* 34* 36*  GLUCOSE 158* 247* 119* 109*  BUN 39* 41* 42* 45*  CREATININE 1.04* 1.16* 1.16* 1.08*  CALCIUM 9.5 9.4 9.5 9.6   GFR: Estimated Creatinine Clearance: 60.6 mL/min (A) (by C-G formula based on SCr of 1.08 mg/dL (H)). Liver Function Tests: Recent Labs  Lab 12/20/19 0611  AST 26  ALT 27  ALKPHOS 110  BILITOT 0.2*  PROT 8.3*  ALBUMIN 2.2*   No results for input(s): LIPASE, AMYLASE in the last 168 hours. No results for input(s): AMMONIA in the last 168 hours. Coagulation Profile: No results for input(s): INR, PROTIME in the last 168 hours. Cardiac Enzymes: No results for input(s): CKTOTAL, CKMB, CKMBINDEX, TROPONINI in the last 168 hours. BNP (last 3 results) No results for input(s): PROBNP in the last 8760 hours. HbA1C: No results for input(s): HGBA1C in the last 72 hours. CBG: Recent Labs  Lab 12/22/19 1207 12/22/19 1616 12/22/19 2126 12/23/19 0027 12/23/19 0325  GLUCAP 156* 112* 141* 177* 112*   Lipid Profile: No results for input(s): CHOL, HDL, LDLCALC, TRIG, CHOLHDL, LDLDIRECT in the last 72 hours. Thyroid Function Tests: No results  for input(s): TSH, T4TOTAL, FREET4, T3FREE, THYROIDAB in the last 72 hours. Anemia Panel: No results for input(s): VITAMINB12, FOLATE, FERRITIN, TIBC, IRON, RETICCTPCT in the last 72 hours. Urine analysis:    Component Value Date/Time   COLORURINE YELLOW 11/26/2019 1522   APPEARANCEUR CLEAR 11/26/2019 1522   LABSPEC 1.011 11/26/2019 1522   PHURINE 8.0 11/26/2019 1522   GLUCOSEU NEGATIVE 11/26/2019 1522   HGBUR NEGATIVE  11/26/2019 1522   BILIRUBINUR NEGATIVE 11/26/2019 1522   KETONESUR NEGATIVE 11/26/2019 1522   PROTEINUR 30 (A) 11/26/2019 1522   UROBILINOGEN 1.0 06/01/2015 0104   NITRITE NEGATIVE 11/26/2019 1522   LEUKOCYTESUR NEGATIVE 11/26/2019 1522   No results found for this or any previous visit (from the past 240 hour(s)).    Radiology Studies: No results found.  Scheduled Meds:  amLODipine  10 mg Per Tube Daily   aspirin  81 mg Per Tube Daily   benztropine  0.5 mg Per Tube Daily   budesonide (PULMICORT) nebulizer solution  0.5 mg Nebulization BID   busPIRone  15 mg Per Tube TID   chlorhexidine  15 mL Mouth Rinse BID   Chlorhexidine Gluconate Cloth  6 each Topical Daily   cloNIDine  0.2 mg Per Tube TID   dolutegravir  50 mg Oral Daily   doxepin  50 mg Per Tube QHS   emtricitabine-tenofovir AF  1 tablet Per Tube Daily   enoxaparin (LOVENOX) injection  40 mg Subcutaneous Q24H   feeding supplement (PRO-STAT SUGAR FREE 64)  30 mL Per Tube TID   free water  200 mL Per Tube Q6H   gabapentin  300 mg Per Tube Q8H   insulin aspart  0-20 Units Subcutaneous Q4H   insulin aspart  5 Units Subcutaneous Q4H   insulin glargine  10 Units Subcutaneous BID   mouth rinse  15 mL Mouth Rinse q12n4p   metoprolol tartrate  12.5 mg Per Tube BID   OLANZapine  7.5 mg Oral QHS   pantoprazole sodium  40 mg Per Tube Daily   polyethylene glycol  17 g Oral Daily   QUEtiapine  200 mg Per Tube BID   rosuvastatin  20 mg Per Tube q1800   sennosides  15 mL Per Tube BID   sertraline  50 mg Per Tube Daily   triamterene-hydrochlorothiazide  1 tablet Per Tube Daily   Continuous Infusions:  sodium chloride Stopped (12/07/19 0216)   feeding supplement (JEVITY 1.2 CAL) 1,000 mL (12/22/19 1848)     LOS: 42 days   Time spent: 25 minutes.  Little Ishikawa, DO [facility] [email or web address] 12/23/2019, 6:57 AM

## 2019-12-23 NOTE — Progress Notes (Deleted)
Does she still meet criteria for LTACH? I'm ok to DC there - I think she's too much for SNF honestly

## 2019-12-24 ENCOUNTER — Inpatient Hospital Stay (HOSPITAL_COMMUNITY): Payer: Medicare Other

## 2019-12-24 DIAGNOSIS — J9601 Acute respiratory failure with hypoxia: Secondary | ICD-10-CM | POA: Diagnosis not present

## 2019-12-24 DIAGNOSIS — R451 Restlessness and agitation: Secondary | ICD-10-CM | POA: Diagnosis not present

## 2019-12-24 DIAGNOSIS — Z930 Tracheostomy status: Secondary | ICD-10-CM

## 2019-12-24 DIAGNOSIS — F250 Schizoaffective disorder, bipolar type: Secondary | ICD-10-CM | POA: Diagnosis not present

## 2019-12-24 DIAGNOSIS — G9341 Metabolic encephalopathy: Secondary | ICD-10-CM

## 2019-12-24 DIAGNOSIS — Z515 Encounter for palliative care: Secondary | ICD-10-CM | POA: Diagnosis not present

## 2019-12-24 LAB — GLUCOSE, CAPILLARY
Glucose-Capillary: 109 mg/dL — ABNORMAL HIGH (ref 70–99)
Glucose-Capillary: 122 mg/dL — ABNORMAL HIGH (ref 70–99)
Glucose-Capillary: 134 mg/dL — ABNORMAL HIGH (ref 70–99)
Glucose-Capillary: 145 mg/dL — ABNORMAL HIGH (ref 70–99)
Glucose-Capillary: 194 mg/dL — ABNORMAL HIGH (ref 70–99)
Glucose-Capillary: 219 mg/dL — ABNORMAL HIGH (ref 70–99)
Glucose-Capillary: 55 mg/dL — ABNORMAL LOW (ref 70–99)
Glucose-Capillary: 81 mg/dL (ref 70–99)

## 2019-12-24 LAB — CBC
HCT: 35.5 % — ABNORMAL LOW (ref 36.0–46.0)
Hemoglobin: 11 g/dL — ABNORMAL LOW (ref 12.0–15.0)
MCH: 27.7 pg (ref 26.0–34.0)
MCHC: 31 g/dL (ref 30.0–36.0)
MCV: 89.4 fL (ref 80.0–100.0)
Platelets: 385 10*3/uL (ref 150–400)
RBC: 3.97 MIL/uL (ref 3.87–5.11)
RDW: 16.7 % — ABNORMAL HIGH (ref 11.5–15.5)
WBC: 10.4 10*3/uL (ref 4.0–10.5)
nRBC: 0 % (ref 0.0–0.2)

## 2019-12-24 LAB — BASIC METABOLIC PANEL
Anion gap: 13 (ref 5–15)
BUN: 45 mg/dL — ABNORMAL HIGH (ref 6–20)
CO2: 35 mmol/L — ABNORMAL HIGH (ref 22–32)
Calcium: 9.6 mg/dL (ref 8.9–10.3)
Chloride: 88 mmol/L — ABNORMAL LOW (ref 98–111)
Creatinine, Ser: 1.12 mg/dL — ABNORMAL HIGH (ref 0.44–1.00)
GFR calc Af Amer: >60 mL/min (ref >60)
GFR calc non Af Amer: 54 mL/min — ABNORMAL LOW (ref >60)
Glucose, Bld: 126 mg/dL — ABNORMAL HIGH (ref 70–99)
Potassium: 3.7 mmol/L (ref 3.5–5.1)
Sodium: 136 mmol/L (ref 135–145)

## 2019-12-24 IMAGING — DX DG CHEST 1V PORT
1 series · 1 of 1 positions shown · non-contrast
Comparison: [DATE]

CLINICAL DATA: Pneumonia

EXAM:
PORTABLE CHEST 1 VIEW

[chest ap]
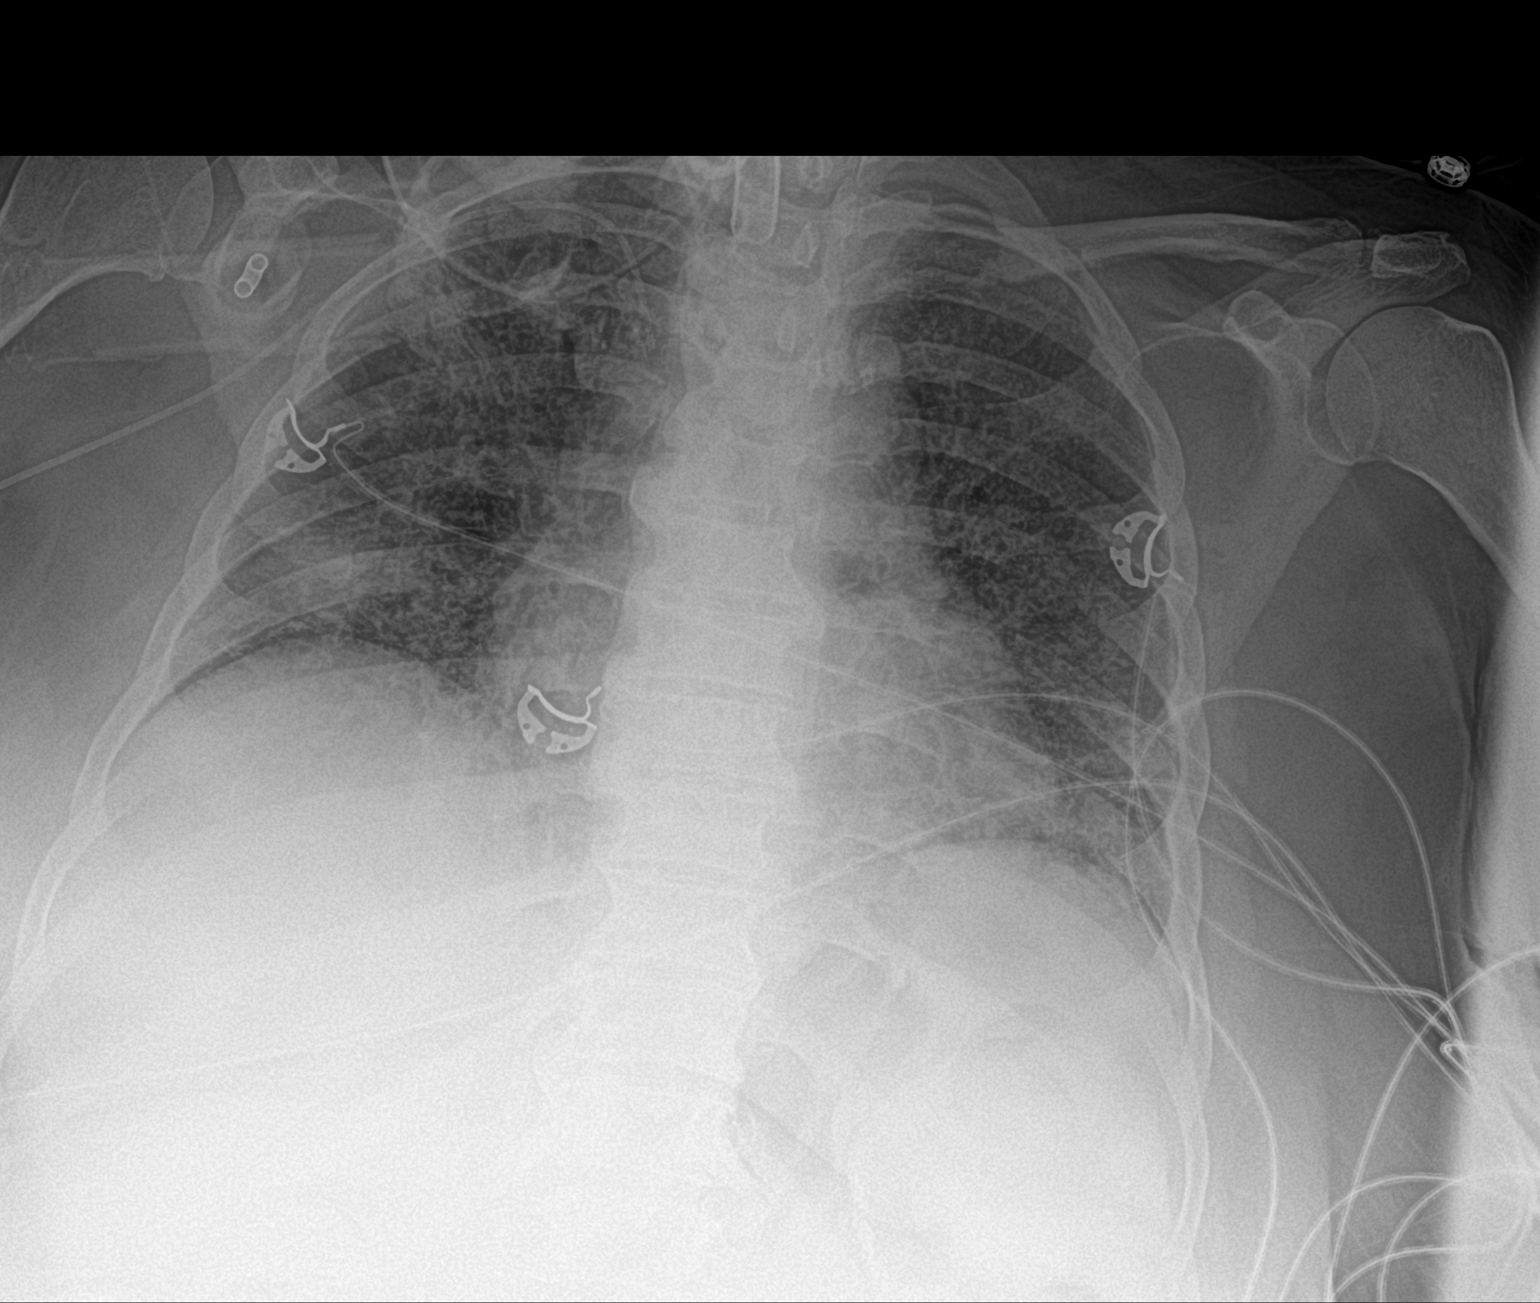

[1 of 1 positions shown; findings below may reference images not displayed]

FINDINGS: Tracheostomy tube noted. Hazy airspace opacity at the right lung
base and to a lesser extent in the right upper lobe with diffuse
interstitial accentuation. Pattern similar to [DATE].

Right PICC line tip: SVC.
IMPRESSION: Hazy airspace opacity at the right lung base and to a lesser extent
in the right upper lobe, suspicious for pneumonia. Background
interstitial accentuation noted. Overall appearance similar to
prior.
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
## 2019-12-24 NOTE — Progress Notes (Addendum)
PROGRESS NOTE  Susan [name]  [id number] DOB: 1961-01-24 DOA: 11/11/2019 PCP: Hoyt Koch, MD   Brief Narrative: Susan [name] is a 59 year old female with a history of HIV, diabetes with neuropathy, seizure disorder, tobacco use, bipolar disease, schizophrenia, history of IV drug abuse admitted 3/10 to ICU with overdose in shock with aspiration pneumonia with MRSA, pneumococcal bacteremia, toxic metabolic encephalopathy, septic shock and acute versus subacute bilateral MCA infarcts. Patient was intubated on 3/10, difficult to wean off, tracheostomy placed on 3/29. Was treated for MRSA pneumonia and massive volume overload with lasix infusion. Decreased movement and mentation was noted and head CT on 3/22 showed bilateral acute vs. subacute MCA infarcts confirmed on MRI 3/23. [facility] assumed care on 12/06/2019. Disposition is complicated by persistent encephalopathy requiring restraints to protect life-sustaining medical devices including trach and PEG. Psychiatry has been consulted for assistance with medication management.  Assessment & Plan: Principal Problem:   Acute respiratory failure with hypoxemia (HCC) Active Problems:   HIV disease (HCC)   Schizoaffective disorder, bipolar type ([place])   Drug-induced hypotension   DNR (do not resuscitate) discussion   AKI (acute kidney injury) ([place])   Pneumococcal bacteremia   Pneumonia   Aortic valve endocarditis   Acute kidney injury (AKI) with acute tubular necrosis (ATN) (HCC)   Acute metabolic encephalopathy   Cerebral embolism with cerebral infarction   Status post tracheostomy ([place])   Palliative care by specialist   Adult failure to thrive  Acute metabolic encephalopathy/hospital delirium: Large CVAs causing brain injury and complicated by pre-existing schizophrenia and bipolar disorder.  - Home medications include seroquel, cogentin, zoloft, buspar, doxepin, neurontin. - Patient is still requiring soft restraints at  times due to agitation and difficulty following commands/complying with requests - high risk for pulling out feeding tube this am as it was wrapped around her left arm as she was banging it against the bed - Psychiatry following 1-2 times per week, recommending downward titration of Seroquel with new addition of Zyprexa.  Acute hypoxic/hypercapnic respiratory failure with ARDS from MRSA pneumonia and aspiration pneumonia with difficulty extubating, history of asthma unlikely acute exacerbation:  - s/p ETT 3/10, tracheostomy 3/29. - PCCM following for tracheostomy care, currently #6 cuffless (since 4/12) undergoing capping trials/tolerating PMV. PCCM following 1-2x/wk, considering decannulation soon. - Scheduled and prn BDs  Dysphagia s/p PEG tube placement:  - Continue wound management, does not currently require antibiotics.   Aerococcus bacteremia 3/25 Klebsiella bronchitis/pneumonia 3/25 -Completed cefepime on 4/1  Acute large bilateral posterior MCA infarcts 3/23: TEE 3/16 without CES. MRI brain c/w b/l posterior MCA territory infarcts; MRA/ MRV neg - Continue ASA, statin.  - Needs continued PT/OT/SLP  Acute systolic heart failure with sepsis induced CM, HTN: Following, no longer requiring regular diuresis.  - Currently on clonidine, amlodipine, triamterene/HCTZ, metoprolol  HLD:  - Continue crestor  Poorly-controlled T2DM: HbA1c 12.3%.  - Continue basal-bolus insulin/CBGs. At inpatient goal.  Hyponatremia: Resolved. - Decreased free water, monitor BMP  Hypokalemia: Resolved, continue to supplement as necessary  HIV: CD4 440, VL 20 in March 2021. - Continue dolutegravir, emtricitabine/tenofovir  At risk malnutrition: S/p PEG placement on 4/5, by IR - Continue with tube feeds as tolerated.  Obesity: Body mass index is 34.8 kg/m.  - Lifestyle modification advised once able to participate in conversation; currently on tube feeds, will discuss calorie goals with  dietary  GOC: Very poor prognosis due to multiple comorbidities - Palliative consulted and had been following  DVT prophylaxis:  SCD's Code Status: Full Family Communication: Daughter by phone previously, none today. Status: Inpatient  Remains inpatient appropriate because:Unsafe d/c plan as pt still requires restraints for her own safety  Dispo: The patient is from: Home  Anticipated d/c is to: SNF vs LTAC  Anticipated d/c date is: TBD - pending clinical/mood improvement, bed availability, and insurance approval  Patient currently is not medically stable to d/c. Requires control of delirium enough to no longer require restraints.  Consultants:   PCCM  ID  Neurology  Procedures:   Mechanical ventilation  TEE  Trach changed to cuffless 4/12  Subjective: Patient remains difficult to orient, no acute issues or events overnight.  Review of systems markedly limited given patient's current baseline - she continues to ask "when am I leaving"   Objective: Vitals:   12/24/19 0024 12/24/19 0349 12/24/19 0411 12/24/19 0434  BP:   119/65   Pulse: 84 77 76   Resp: 18 (!) 22 20   Temp: 98.5 F (36.9 C)  98.3 F (36.8 C)   TempSrc: Oral  Oral   SpO2: 96% 99% 99%   Weight:    89.1 kg  Height:        Intake/Output Summary (Last 24 hours) at 12/24/2019 0655 Last data filed at 12/24/2019 0616 Gross per 24 hour  Intake 3470.83 ml  Output 1600 ml  Net 1870.83 ml   Filed Weights   12/22/19 0400 12/23/19 0500 12/24/19 0434  Weight: 88.5 kg 88.4 kg 89.1 kg   Gen: 59 y.o. female in no distress, resting quietly Pulm: Nonlabored breathing, no wheezes or crackles. CV: Regular rate and rhythm. No murmur, rub, or gallop. No JVD, no dependent edema. GI: Abdomen soft, non-tender, obese but non-distended, with normoactive bowel sounds.  Ext: Warm, no deformities or edema Skin: PEG site with slough, underneath is healthy wound edges without  erythema, induration or tenderness. Thick dark brown secretions around trach site. No other rashes, lesions or ulcers on visualized skin. Neuro: Rousable, tracks, not oriented, interactive. Moves all extremities   Data Reviewed: I have personally reviewed following labs and imaging studies  CBC: Recent Labs  Lab 12/18/19 1611 12/19/19 0621 12/21/19 0425 12/23/19 0500 12/24/19 0452  WBC 10.9* 10.8* 11.1* 10.7* 10.4  NEUTROABS 6.5 6.3  --   --   --   HGB 11.2* 11.0* 10.7* 10.8* 11.0*  HCT 36.0 34.6* 34.1* 34.5* 35.5*  MCV 89.8 88.7 89.0 88.7 89.4  PLT 260 253 269 335 0000000   Basic Metabolic Panel: Recent Labs  Lab 12/20/19 0611 12/21/19 0425 12/22/19 0500 12/23/19 0500 12/24/19 0452  NA 134* 133* 136 137 136  K 3.4* 3.8 3.6 3.8 3.7  CL 87* 87* 91* 90* 88*  CO2 35* 34* 34* 36* 35*  GLUCOSE 158* 247* 119* 109* 126*  BUN 39* 41* 42* 45* 45*  CREATININE 1.04* 1.16* 1.16* 1.08* 1.12*  CALCIUM 9.5 9.4 9.5 9.6 9.6   GFR: Estimated Creatinine Clearance: 58.7 mL/min (A) (by C-G formula based on SCr of 1.12 mg/dL (H)). Liver Function Tests: Recent Labs  Lab 12/20/19 0611  AST 26  ALT 27  ALKPHOS 110  BILITOT 0.2*  PROT 8.3*  ALBUMIN 2.2*   No results for input(s): LIPASE, AMYLASE in the last 168 hours. No results for input(s): AMMONIA in the last 168 hours. Coagulation Profile: No results for input(s): INR, PROTIME in the last 168 hours. Cardiac Enzymes: No results for input(s): CKTOTAL, CKMB, CKMBINDEX, TROPONINI in the last 168 hours. BNP (last  3 results) No results for input(s): PROBNP in the last 8760 hours. HbA1C: No results for input(s): HGBA1C in the last 72 hours. CBG: Recent Labs  Lab 12/23/19 1221 12/23/19 1625 12/23/19 2036 12/24/19 0014 12/24/19 0405  GLUCAP 146* 118* 98 145* 109*   Lipid Profile: No results for input(s): CHOL, HDL, LDLCALC, TRIG, CHOLHDL, LDLDIRECT in the last 72 hours. Thyroid Function Tests: No results for input(s): TSH,  T4TOTAL, FREET4, T3FREE, THYROIDAB in the last 72 hours. Anemia Panel: No results for input(s): VITAMINB12, FOLATE, FERRITIN, TIBC, IRON, RETICCTPCT in the last 72 hours. Urine analysis:    Component Value Date/Time   COLORURINE YELLOW 11/26/2019 1522   APPEARANCEUR CLEAR 11/26/2019 1522   LABSPEC 1.011 11/26/2019 1522   PHURINE 8.0 11/26/2019 1522   GLUCOSEU NEGATIVE 11/26/2019 1522   HGBUR NEGATIVE 11/26/2019 1522   BILIRUBINUR NEGATIVE 11/26/2019 1522   KETONESUR NEGATIVE 11/26/2019 1522   PROTEINUR 30 (A) 11/26/2019 1522   UROBILINOGEN 1.0 06/01/2015 0104   NITRITE NEGATIVE 11/26/2019 1522   LEUKOCYTESUR NEGATIVE 11/26/2019 1522   No results found for this or any previous visit (from the past 240 hour(s)).    Radiology Studies: No results found.  Scheduled Meds: . amLODipine  10 mg Per Tube Daily  . aspirin  81 mg Per Tube Daily  . benztropine  0.5 mg Per Tube Daily  . budesonide (PULMICORT) nebulizer solution  0.5 mg Nebulization BID  . busPIRone  15 mg Per Tube TID  . chlorhexidine  15 mL Mouth Rinse BID  . Chlorhexidine Gluconate Cloth  6 each Topical Daily  . cloNIDine  0.2 mg Per Tube TID  . dolutegravir  50 mg Oral Daily  . doxepin  50 mg Per Tube QHS  . emtricitabine-tenofovir AF  1 tablet Per Tube Daily  . enoxaparin (LOVENOX) injection  40 mg Subcutaneous Q24H  . feeding supplement (PRO-STAT SUGAR FREE 64)  30 mL Per Tube TID  . free water  200 mL Per Tube Q6H  . gabapentin  300 mg Per Tube Q8H  . insulin aspart  0-20 Units Subcutaneous Q4H  . insulin aspart  5 Units Subcutaneous Q4H  . insulin glargine  10 Units Subcutaneous BID  . mouth rinse  15 mL Mouth Rinse q12n4p  . metoprolol tartrate  12.5 mg Per Tube BID  . OLANZapine  7.5 mg Oral QHS  . pantoprazole sodium  40 mg Per Tube Daily  . polyethylene glycol  17 g Oral Daily  . QUEtiapine  200 mg Per Tube BID  . rosuvastatin  20 mg Per Tube q1800  . sennosides  15 mL Per Tube BID  . sertraline  50 mg  Per Tube Daily  . triamterene-hydrochlorothiazide  1 tablet Per Tube Daily   Continuous Infusions: . sodium chloride Stopped (12/07/19 0216)  . feeding supplement (JEVITY 1.2 CAL) 1,000 mL (12/22/19 1848)     LOS: 43 days   Time spent: 25 minutes.  Susan Ishikawa, DO [facility] [email or web address] 12/24/2019, 6:55 AM

## 2019-12-24 NOTE — Progress Notes (Signed)
NAME:  Susan [name], MRN:  [id number], DOB:  04-15-1961, LOS: 39 ADMISSION DATE:  11/11/2019, CONSULTATION DATE:  3/10 REFERRING MD:  Sabra Heck, CHIEF COMPLAINT:  Acute encephalopathy and respiratory failure    Brief History   59 year old female smoker admitted 3/10 with overdose complicated by aspiration PNA with MRSA,  Pneumococcal bacteremia, toxic metabolic encephalopathy and septic shock.    Past Medical History  Frequent falls at home,Tobacco abuse, diabetes with painful polyneuropathy, bipolar disease, GERD, HIV, schizophrenia, history of IV drug abuse, seizure disorder.   Significant Hospital Events   3/10 Admit with overdose, intubated on arrival, in shock   3/15 Ventilator asynchrony, heavy sedation & intermittent paralysis   3/16 Lasix drip.  Awaiting TEE.  Sputum w/ MRSA.   Coming back down on sedation, changed to [facility]. TEE showing NO EVIDENCE of endocarditis. ABX changed to pen G per ID. MRSA therapy complete. EF improved. Now up to 50% from 20-25% 3/17 Heavily sedated. Having episodes of bradycardia. Still massively overloaded (>14L), increasing lasix gtt to 8 mg from 4, adding metolazone.  3/19 Lasix to 40 every 12, changing RASS goal to -2, decrease in fentanyl, decreasing PEEP, anticipating trialing pressure support ventilation.  Watching upward trend of white blood cell count and low-grade fever 3/20 BM early am. Agitation early am > sedate post am PT psy meds 3/22 Decreased mentation and movement of extremities overnight, sent for Head CT which revealed acute vs subacute bilateral MCA infarcts  3/23 Lying in bed seen spontaneously moving lower and right upper extremity, unable to follow any commands.  3/24 Hypoglycemic episodes overnight, Weaning well 10/5 today, tmax 101.5 Remains on precedex gtt 1.2 3/29 tracheostomy placed 4/12 #6 shiley cuffed exchanged for #6 cuffless.  4/14 PMV with SLP 4/15 Plan for MBS  Consults:  ID Cards for TEE Neurology  Palliative  care  Procedures:  ETT 3/10 >>  R Fem TLC (ER) 3/10  R IJ TLC 3/10  L IJ TLC 3/10 >> 3/17  RUE PICC 3/16 >>  Trach 3/29 >>  PEG 4/05 >>   Significant Diagnostic Tests:   CT brain 3/10 >> Normal head CT  EEG 3/10 >> Moderate to severe diffuse encephalopathy. No seizures  Urine drug screen 3/10 >> positive for THC, acetaminophen Q000111Q, salicylate <7, ETOH Q000111Q  ECHO 3/11 >> EF 20-25%, LV function severely diminished, Global Hypokinesis,LV hypertrophy, Grade 1 Diastolic Dysfunction, Elevated PASP, Dilated LA, MV regurgitation, small mobile density right coronary cusp  TEE 3/16 >> LVEF 55%, No LA/LAA thrombus or masses, Previously noted mass on the aortic valve is a node of Arancius.  Normal variant and no evidence of endocarditis.Trivial TR and trivial PR 3/23 MRI brain >>Acute nonhemorrhagic infarcts in the posterior MCA territory bilaterally. Otherwise no evidence of chronic ischemia. 3/23 MRA head >>No significant intracranial stenosis.  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 3/23 MRV head >> negative  Micro Data:  Influenza A/B 3/10 >> negative  COVID 3/10 >> negative  MRSA PCR 3/10 >> positive  BCx2 3/10 >> streptococcus pneumoniae >> S-rocephin BCx1 (aerobic only) 3/10 >> negative  Tracheal aspirate 3/11 >> MRSA BCx2 3/12 >> negative  BCx2 3/25 >> aerococcus  Resp cx 3/25 >> Klebsiella (R amp)   Antimicrobials:  Vancomycin 3/10 >> 3/16 Unasyn 3/10 >> 3/16 Pen G 3/16 >> 3/23 Cefepime 3/26 >> 3/31  Interim history/subjective:  Still confused. Seems to be having hallucinations  Still has thick brown purulent tracheal secretions   Objective  Blood Pressure 119/65   Pulse 89   Temperature 98.3 F (36.8 C) (Oral)   Respiration (Abnormal) 22   Height 5\' 3"  (1.6 m)   Weight 89.1 kg   Last Menstrual Period  (LMP Unknown)   Oxygen Saturation 99%   Body Mass Index 34.80 kg/m   I/O last 3 completed shifts: In: 5574.2 [NG/GT:5574.2] Out:  1600 [Urine:1600]  Examination:  General this is a 59 year old black female. She is currently awake, interactive but very confused w/ hallucinations.  HENT #6 cuffless trach still has copious thick purulent brown appearing tracheal secretions w/ wet rhonchus upper airway sounds. Does have turbulent course upper airway sounds w/ weak quality phonation w/ trach occluded Pulm diffuse rhonchi, no accessory use. Currently on 28% ATC. Hasn't had CXR since 4/17 which showed patchy bilateral airspace disease Card RRR abd not tender + bowel sounds  Neuro awake, moves all extremities. Still confused  Resolved Hospital Problem list   Rhabdomyolysis, Elevated INR, Septic shock, MRSA and aspiration pneumonia, and Pneumococcal bacteremia, Aerococcus bacteremia, Klebsiella HCAP ARDS from MRSA PNA Aspiration PNA  Aerococcus bacteremia Klebsiella PNA   Assessment & Plan:   Acute respiratory failure 2/2 PNA requiring intubation, now s/p tracheostomy  Hx of asthma. Acute bilateral large bilateral posterior MCA infarcts Hx seizures, hx anoxic injury-supportive care  Schizophrenia, acute encephalpathy- seroquel, zoloft, buspar, cogentin, doxepin, gabapentin, PRN restraints HIV- tivicay, descovy  Acute systolic heart failure with sepsis induced CM - s/p TEE and without signs of endocarditis HTN, HLD DM II, poorly controlled A1c 12.3 - SSI + basal  Obesity, At risk malnutrition- s/p PEG 4/5.   Discussion  Working w/ SLP but requiring redirection  Psych has seen pt. Making adjustments for aggression and schizophrenia.  She has copious tracheal secretions; I am concerned she still has mild tracheobronchitis vs recurrent HCAP    Plan Repeat CXR today Send sputum culture IF we can get her sputum and tracheal secretions thinned and decreased in volume we could consider down-sizing. I think she may eventually be a candidate to decannulate but need to see more progress in her secretions first.   Erick Colace ACNP-BC [place] Pager # [phone] OR # [phone] if no answer

## 2019-12-24 NOTE — Progress Notes (Signed)
Patient noted to be calling out and banging on siderail.  Patient was also pulling on tubing for tube feed.  Patient stated someone was standing by her window.  Reassurance provided with little effect.  PRN Haldol given with effect pending.

## 2019-12-24 NOTE — Progress Notes (Signed)
Patient ID: Susan [name], female   DOB: 12/10/1960, 59 y.o.   MRN: [id number]  This NP visited patient at the bedside as a follow up for palliative medicine  needs and emotional support, and to meet with daughter at bedsdie as scheduled.  Ongoing education offered  regarding current medical situation; diagnosis, prognosis, treatment option decisions, GOCs and anticipatory care needs and her high risk for decompensation.  Daughter verbalizes understanding of the seriousness of her mother's current medical situation but remains hopeful for improvement.  Education offered regarding the limited transition of care options. Daughter understands that SNF is being pursued but ultimately would like her mother to return home.   Education regarding the importance of advanced care planning,  MOST form introduced and Hard Choices booklet left for review.  Encouraged family to consider DNR/DNI status understanding evidenced based poor outcomes in similar hospitalized patients.    Discussed with daughter the importance of continued conversation with her family and the  medical providers regarding overall plan of care and treatment options,  ensuring decisions are within the context of the patients values and GOCs.  Questions and concerns addressed     Total time spent on the unit was 25 minutes  This nurse practitioner informed the family that I will be out of the hospital until Monday morning.  ]I will follow-up at that time.  Call palliative medicine team phone # [phone] with questions or concerns.  Greater than 50% of the time was spent in counseling and coordination of care  Wadie Lessen NP  Palliative Medicine Team

## 2019-12-25 DIAGNOSIS — J9601 Acute respiratory failure with hypoxia: Secondary | ICD-10-CM | POA: Diagnosis not present

## 2019-12-25 DIAGNOSIS — R451 Restlessness and agitation: Secondary | ICD-10-CM | POA: Diagnosis not present

## 2019-12-25 DIAGNOSIS — F250 Schizoaffective disorder, bipolar type: Secondary | ICD-10-CM | POA: Diagnosis not present

## 2019-12-25 DIAGNOSIS — Z515 Encounter for palliative care: Secondary | ICD-10-CM | POA: Diagnosis not present

## 2019-12-25 LAB — GLUCOSE, CAPILLARY
Glucose-Capillary: 153 mg/dL — ABNORMAL HIGH (ref 70–99)
Glucose-Capillary: 194 mg/dL — ABNORMAL HIGH (ref 70–99)
Glucose-Capillary: 232 mg/dL — ABNORMAL HIGH (ref 70–99)
Glucose-Capillary: 165 mg/dL — ABNORMAL HIGH (ref 70–99)
Glucose-Capillary: 160 mg/dL — ABNORMAL HIGH (ref 70–99)

## 2019-12-25 NOTE — Progress Notes (Signed)
PROGRESS NOTE  Susan [name]  [id number] DOB: 1961/02/22 DOA: 11/11/2019 PCP: Hoyt Koch, MD   Brief Narrative: Susan [name] is a 59 year old female with a history of HIV, diabetes with neuropathy, seizure disorder, tobacco use, bipolar disease, schizophrenia, history of IV drug abuse admitted 3/10 to ICU with overdose in shock with aspiration pneumonia with MRSA, pneumococcal bacteremia, toxic metabolic encephalopathy, septic shock and acute versus subacute bilateral MCA infarcts. Patient was intubated on 3/10, difficult to wean off, tracheostomy placed on 3/29. Was treated for MRSA pneumonia and massive volume overload with lasix infusion. Decreased movement and mentation was noted and head CT on 3/22 showed bilateral acute vs. subacute MCA infarcts confirmed on MRI 3/23. [facility] assumed care on 12/06/2019. Disposition is complicated by persistent encephalopathy requiring restraints to protect life-sustaining medical devices including trach and PEG. Psychiatry has been consulted for assistance with medication management.  Assessment & Plan: Principal Problem:   Acute respiratory failure with hypoxemia (HCC) Active Problems:   HIV disease (HCC)   Schizoaffective disorder, bipolar type ([place])   Drug-induced hypotension   DNR (do not resuscitate) discussion   AKI (acute kidney injury) ([place])   Pneumococcal bacteremia   Pneumonia   Aortic valve endocarditis   Acute kidney injury (AKI) with acute tubular necrosis (ATN) (HCC)   Acute metabolic encephalopathy   Cerebral embolism with cerebral infarction   Status post tracheostomy ([place])   Palliative care by specialist   Adult failure to thrive   Agitation   Acute metabolic encephalopathy/hospital delirium: Large CVAs causing brain injury and complicated by pre-existing schizophrenia and bipolar disorder.  - Home medications include seroquel, cogentin, zoloft, buspar, doxepin, neurontin. - Patient is still requiring  soft restraints at times due to agitation and difficulty following commands/complying with requests - high risk for pulling out feeding tube this am as it was wrapped around her left arm as she was banging it against the bed - Psychiatry following 1-2 times per week, recommending downward titration of Seroquel with new addition of Zyprexa. -Patient remains fairly combative, episode overnight with night team, will discuss with psychiatry gven she continues to occasionally require IV Haldol  Acute hypoxic/hypercapnic respiratory failure with ARDS from MRSA pneumonia and aspiration pneumonia with difficulty extubating, history of asthma unlikely acute exacerbation:  - s/p ETT 3/10, tracheostomy 3/29. - PCCM following for tracheostomy care, currently #6 cuffless (since 4/12) undergoing capping trials/tolerating PMV. PCCM following 1-2x/wk, considering decannulation soon. - Scheduled and prn breathing treatments  Dysphagia s/p PEG tube placement:  -Speech therapy following, advance diet per recommendations, tolerating PEG tube feedings and medication administration without issue.  Aerococcus bacteremia 3/25 Klebsiella bronchitis/pneumonia 3/25 -Completed cefepime on 4/1  Acute large bilateral posterior MCA infarcts 3/23: TEE 3/16 without CES. MRI brain c/w b/l posterior MCA territory infarcts; MRA/ MRV neg - Continue ASA, statin.  - Needs continued PT/OT/SLP -disposition pending likely SNF versus LTAC given needs.  Acute systolic heart failure with sepsis induced CM, HTN: Following, no longer requiring regular diuresis.  - Currently on clonidine, amlodipine, triamterene/HCTZ, metoprolol  HLD:  - Continue crestor  Poorly-controlled T2DM: HbA1c 12.3%.  - Continue basal-bolus insulin/CBGs. At inpatient goal.  Hyponatremia: Resolved. - Decreased free water, monitor BMP  Hypokalemia:  - Resolved, continue to supplement as necessary  HIV: CD4 440, VL 20 in March 2021. - Continue  dolutegravir, emtricitabine/tenofovir  At risk malnutrition: S/p PEG placement on 4/5, by IR - Continue with tube feeds as tolerated.  Obesity: Body mass index is  34.25 kg/m.  - Lifestyle modification advised once able to participate in conversation; currently on tube feeds, will discuss calorie goals with dietary  GOC: Very poor prognosis due to multiple comorbidities - Palliative consulted and had been following  DVT prophylaxis: SCD's Code Status: Full Family Communication: Daughter by phone previously Status: Inpatient  Remains inpatient appropriate because:Unsafe d/c plan as pt still requires restraints for her own safety  Dispo: The patient is from: Home  Anticipated d/c is to: SNF vs LTAC  Anticipated d/c date is: TBD - pending clinical/mood improvement, bed availability, and insurance approval  Patient currently is not medically stable to d/c. Requires control of delirium enough to no longer require restraints.  Consultants:   PCCM  ID  Neurology  Procedures:   Mechanical ventilation  TEE  Trach changed to cuffless 4/12  Subjective: Patient remains difficult to orient, episode of combativeness overnight attempted to kick nursing staff.  Reoriented, this morning patient sitting up in bedside chair with physical therapy more pleasant, continues to have limited review of systems as patient is communicative but has difficulty with word finding  Objective: Vitals:   12/24/19 1930 12/24/19 2345 12/25/19 0340 12/25/19 0408  BP: 116/62 115/64 (P) 115/66   Pulse: 76 76 (P) 82   Resp: 20 18 (P) 12   Temp:  98.3 F (36.8 C) (P) 98.8 F (37.1 C)   TempSrc:  Oral (P) Oral   SpO2: 100% 100% (P) 98%   Weight:    87.7 kg  Height:        Intake/Output Summary (Last 24 hours) at 12/25/2019 [id number] Last data filed at 12/24/2019 1816 Gross per 24 hour  Intake 400 ml  Output 500 ml  Net -100 ml   Filed Weights   12/23/19 0500  12/24/19 0434 12/25/19 0408  Weight: 88.4 kg 89.1 kg 87.7 kg   Gen: 59 y.o. female in no distress, sitting up in bedside chair Pulm: Nonlabored breathing, no wheezes or crackles. CV: Regular rate and rhythm. No murmur, rub, or gallop. No JVD, no dependent edema. GI: Abdomen soft, non-tender, obese but non-distended, with normoactive bowel sounds.  Ext: Warm, no deformities or edema Skin: PEG site with slough, underneath is healthy wound edges without erythema, induration or tenderness. Thick dark brown secretions around trach site. No other rashes, lesions or ulcers on visualized skin. Neuro: Noted exam, speech improving but still has word finding difficulty, moving all 4 extremities  Data Reviewed: I have personally reviewed following labs and imaging studies  CBC: Recent Labs  Lab 12/18/19 1611 12/19/19 0621 12/21/19 0425 12/23/19 0500 12/24/19 0452  WBC 10.9* 10.8* 11.1* 10.7* 10.4  NEUTROABS 6.5 6.3  --   --   --   HGB 11.2* 11.0* 10.7* 10.8* 11.0*  HCT 36.0 34.6* 34.1* 34.5* 35.5*  MCV 89.8 88.7 89.0 88.7 89.4  PLT 260 253 269 335 0000000   Basic Metabolic Panel: Recent Labs  Lab 12/20/19 0611 12/21/19 0425 12/22/19 0500 12/23/19 0500 12/24/19 0452  NA 134* 133* 136 137 136  K 3.4* 3.8 3.6 3.8 3.7  CL 87* 87* 91* 90* 88*  CO2 35* 34* 34* 36* 35*  GLUCOSE 158* 247* 119* 109* 126*  BUN 39* 41* 42* 45* 45*  CREATININE 1.04* 1.16* 1.16* 1.08* 1.12*  CALCIUM 9.5 9.4 9.5 9.6 9.6   GFR: Estimated Creatinine Clearance: 58.2 mL/min (A) (by C-G formula based on SCr of 1.12 mg/dL (H)). Liver Function Tests: Recent Labs  Lab 12/20/19 0611  AST 26  ALT 27  ALKPHOS 110  BILITOT 0.2*  PROT 8.3*  ALBUMIN 2.2*   No results for input(s): LIPASE, AMYLASE in the last 168 hours. No results for input(s): AMMONIA in the last 168 hours. Coagulation Profile: No results for input(s): INR, PROTIME in the last 168 hours. Cardiac Enzymes: No results for input(s): CKTOTAL, CKMB,  CKMBINDEX, TROPONINI in the last 168 hours. BNP (last 3 results) No results for input(s): PROBNP in the last 8760 hours. HbA1C: No results for input(s): HGBA1C in the last 72 hours. CBG: Recent Labs  Lab 12/24/19 1633 12/24/19 1759 12/24/19 1919 12/24/19 2345 12/25/19 0338  GLUCAP 55* 122* 134* 81 153*   Lipid Profile: No results for input(s): CHOL, HDL, LDLCALC, TRIG, CHOLHDL, LDLDIRECT in the last 72 hours. Thyroid Function Tests: No results for input(s): TSH, T4TOTAL, FREET4, T3FREE, THYROIDAB in the last 72 hours. Anemia Panel: No results for input(s): VITAMINB12, FOLATE, FERRITIN, TIBC, IRON, RETICCTPCT in the last 72 hours. Urine analysis:    Component Value Date/Time   COLORURINE YELLOW 11/26/2019 1522   APPEARANCEUR CLEAR 11/26/2019 1522   LABSPEC 1.011 11/26/2019 1522   PHURINE 8.0 11/26/2019 1522   GLUCOSEU NEGATIVE 11/26/2019 1522   HGBUR NEGATIVE 11/26/2019 1522   BILIRUBINUR NEGATIVE 11/26/2019 1522   KETONESUR NEGATIVE 11/26/2019 1522   PROTEINUR 30 (A) 11/26/2019 1522   UROBILINOGEN 1.0 06/01/2015 0104   NITRITE NEGATIVE 11/26/2019 1522   LEUKOCYTESUR NEGATIVE 11/26/2019 1522   Recent Results (from the past 240 hour(s))  Culture, respiratory (non-expectorated)     Status: None (Preliminary result)   Collection Time: 12/24/19 10:55 AM   Specimen: Tracheal Aspirate; Respiratory  Result Value Ref Range Status   Specimen Description TRACHEAL ASPIRATE  Final   Special Requests NONE  Final   Gram Stain   Final    FEW WBC PRESENT,BOTH PMN AND MONONUCLEAR RARE GRAM POSITIVE COCCI IN PAIRS Performed at [facility], [place] [address] [place], [place] [address]    Culture PENDING  Incomplete   Report Status PENDING  Incomplete      Radiology Studies: DG Chest Port 1 View  Result Date: 12/24/2019 CLINICAL DATA:  Pneumonia EXAM: PORTABLE CHEST 1 VIEW COMPARISON:  12/19/2019 FINDINGS: Tracheostomy tube noted. Hazy airspace opacity at the right lung base  and to a lesser extent in the right upper lobe with diffuse interstitial accentuation. Pattern similar to 12/19/2019. Right PICC line tip: SVC. IMPRESSION: Hazy airspace opacity at the right lung base and to a lesser extent in the right upper lobe, suspicious for pneumonia. Background interstitial accentuation noted. Overall appearance similar to prior. Electronically Signed   By: Van Clines M.D.   On: 12/24/2019 10:32    Scheduled Meds: . amLODipine  10 mg Per Tube Daily  . aspirin  81 mg Per Tube Daily  . benztropine  0.5 mg Per Tube Daily  . budesonide (PULMICORT) nebulizer solution  0.5 mg Nebulization BID  . busPIRone  15 mg Per Tube TID  . chlorhexidine  15 mL Mouth Rinse BID  . Chlorhexidine Gluconate Cloth  6 each Topical Daily  . cloNIDine  0.2 mg Per Tube TID  . dolutegravir  50 mg Oral Daily  . doxepin  50 mg Per Tube QHS  . emtricitabine-tenofovir AF  1 tablet Per Tube Daily  . enoxaparin (LOVENOX) injection  40 mg Subcutaneous Q24H  . feeding supplement (PRO-STAT SUGAR FREE 64)  30 mL Per Tube TID  . free water  200 mL Per Tube Q6H  .  gabapentin  300 mg Per Tube Q8H  . insulin aspart  0-20 Units Subcutaneous Q4H  . insulin glargine  10 Units Subcutaneous BID  . mouth rinse  15 mL Mouth Rinse q12n4p  . metoprolol tartrate  12.5 mg Per Tube BID  . OLANZapine  7.5 mg Oral QHS  . pantoprazole sodium  40 mg Per Tube Daily  . polyethylene glycol  17 g Oral Daily  . QUEtiapine  200 mg Per Tube BID  . rosuvastatin  20 mg Per Tube q1800  . sennosides  15 mL Per Tube BID  . sertraline  50 mg Per Tube Daily  . triamterene-hydrochlorothiazide  1 tablet Per Tube Daily   Continuous Infusions: . sodium chloride Stopped (12/07/19 0216)  . feeding supplement (JEVITY 1.2 CAL) 1,000 mL (12/22/19 1848)     LOS: 44 days   Time spent: 25 minutes.  Little Ishikawa, DO [facility] [email or web address] 12/25/2019, 6:49 AM

## 2019-12-25 NOTE — Progress Notes (Signed)
Physical Therapy Treatment Patient Details Name: Susan [name] MRN: [id number] DOB: 23-Apr-1961 Today's Date: 12/25/2019    History of Present Illness 59 year old female smoker admitted 3/10 with overdose complicated by aspiration PNA with MRSA,  Pneumococcal bacteremia, toxic metabolic encephalopathy and septic shock.  Frequent falls at home,Tobacco abuse, diabetes with painful polyneuropathy, bipolar disease, GERD, HIV, schizophrenia, history of IV drug abuse, seizure disorder. Intubated 3/10-3/29 3/23 MRI brain >>Acute nonhemorrhagic infarcts in the posterior MCA territory bilaterally. Tracheostomy placed 3/29    PT Comments    Pt very eager to participate in therapies this day, stating "are we going to sit up, I want to sit up". Pt requires mod +2 assist for bed mobility, repeated transfer training, and pivotal steps to recliner. Pt typically has dififculty initiating mobility, but once she understands repeated commands, pt is very participatory. Pt dancing and laughing throughout session, and enjoyed listening to [place] during PT/OT. PT informed RN pt needs trach care at end of session. PT to continue to follow acutely.   Follow Up Recommendations  SNF     Equipment Recommendations  None recommended by PT    Recommendations for Other Services       Precautions / Restrictions Precautions Precautions: Fall Precaution Comments: bilateral mitts and UE restraints. PEG. Trach collar 28%FiO2 Restrictions Weight Bearing Restrictions: No    Mobility  Bed Mobility Overal bed mobility: Needs Assistance Bed Mobility: Supine to Sit     Supine to sit: +2 for physical assistance;Mod assist Sit to supine: Mod assist;+2 for physical assistance   General bed mobility comments: mod assist for LEs over EOB, elevate trunk to upright. Pt with use of bedrails to assist and performs 50% of mobility when she knows what we are trying to accomplish.  Transfers Overall transfer level: Needs  assistance Equipment used: 2 person hand held assist;Rolling walker (2 wheeled) Transfers: Sit to/from [facility] Sit to Stand: +2 physical assistance;Mod assist Stand pivot transfers: +2 physical assistance;Min assist       General transfer comment: Pt transferred x 2 with B hand held assist, worked on sit<>stand from chair with RW x 3. Mod assist for initial power up, but once initated pt rises to stand.  Ambulation/Gait             General Gait Details: unable this day   Stairs             Wheelchair Mobility    Modified Rankin (Stroke Patients Only) Modified Rankin (Stroke Patients Only) Pre-Morbid Rankin Score: No symptoms Modified Rankin: Moderately severe disability     Balance Overall balance assessment: Needs assistance Sitting-balance support: Feet supported Sitting balance-Leahy Scale: Fair Sitting balance - Comments: no LOB seated at EOB or in chair without back support during ADLs and dancing, sat upright ~20 minutes total     Standing balance-Leahy Scale: Poor Standing balance comment: stands for up to 10 seconds with RW before fatiguing and impulsively returning to sit                            Cognition Arousal/Alertness: Awake/alert Behavior During Therapy: Impulsive;Restless Overall Cognitive Status: Impaired/Different from baseline Area of Impairment: Orientation;Attention;Memory;Following commands;Safety/judgement;Awareness;Problem solving                 Orientation Level: Disoriented to;Time;Situation Current Attention Level: Focused Memory: Decreased short-term memory;Decreased recall of precautions Following Commands: Follows one step commands with increased time;Follows one step commands inconsistently Safety/Judgement: Decreased  awareness of safety;Decreased awareness of deficits Awareness: Intellectual Problem Solving: Decreased initiation;Difficulty sequencing;Requires verbal cues;Requires  tactile cues;Slow processing General Comments: pt responds well to eye contact prior when given directions      Exercises General Exercises - Lower Extremity Long Arc Quad: AROM;Both;10 reps;Seated Mini-Sqauts: AROM;Both;Seated;Standing;Other (comment)(3x from recliner, with 10-sec static stand)    General Comments        Pertinent Vitals/Pain Pain Assessment: Faces Faces Pain Scale: No hurt Pain Intervention(s): Monitored during session    Home Living                      Prior Function            PT Goals (current goals can now be found in the care plan section) Acute Rehab PT Goals Patient Stated Goal: to color PT Goal Formulation: With patient Time For Goal Achievement: 12/29/19 Potential to Achieve Goals: Fair Progress towards PT goals: Progressing toward goals    Frequency    Min 3X/week      PT Plan Current plan remains appropriate    Co-evaluation PT/OT/SLP Co-Evaluation/Treatment: Yes Reason for Co-Treatment: For patient/therapist safety;Necessary to address cognition/behavior during functional activity;To address functional/ADL transfers PT goals addressed during session: Mobility/safety with mobility;Balance;Strengthening/ROM OT goals addressed during session: ADL's and self-care;Strengthening/ROM      AM-PAC PT "6 Clicks" Mobility   Outcome Measure  Help needed turning from your back to your side while in a flat bed without using bedrails?: Total Help needed moving from lying on your back to sitting on the side of a flat bed without using bedrails?: A Lot Help needed moving to and from a bed to a chair (including a wheelchair)?: A Lot Help needed standing up from a chair using your arms (e.g., wheelchair or bedside chair)?: A Lot Help needed to walk in hospital room?: A Lot Help needed climbing 3-5 steps with a railing? : Total 6 Click Score: 10    End of Session Equipment Utilized During Treatment: Oxygen;Gait belt Activity  Tolerance: Patient tolerated treatment well;Patient limited by fatigue Patient left: in bed;with bed alarm set;with restraints reapplied;with call bell/phone within reach Nurse Communication: Mobility status;Other (comment)(needs trach care) PT Visit Diagnosis: Unsteadiness on feet (R26.81);Other abnormalities of gait and mobility (R26.89);Repeated falls (R29.6);Muscle weakness (generalized) (M62.81);History of falling (Z91.81);Difficulty in walking, not elsewhere classified (R26.2);Hemiplegia and hemiparesis Hemiplegia - Right/Left: Left Hemiplegia - dominant/non-dominant: Non-dominant Hemiplegia - caused by: Cerebral infarction     Time: 0915-0955 PT Time Calculation (min) (ACUTE ONLY): 40 min  Charges:  $Therapeutic Activity: 8-22 mins $Neuromuscular Re-education: 8-22 mins                     Muad Noga E, PT Acute Rehabilitation Services Pager [phone]  Office [phone]   Felice Deem D Ihsan Nomura 12/25/2019, 1:32 PM

## 2019-12-25 NOTE — Progress Notes (Signed)
Attempted trach care to clean around stoma and secretions, pt just screamed 'get out'. Will try again later

## 2019-12-25 NOTE — Progress Notes (Signed)
PT would not let me touch her. Was unable to suction or clean trach

## 2019-12-25 NOTE — Progress Notes (Signed)
Occupational Therapy Treatment Patient Details Name: Susan [name] MRN: [id number] DOB: 20-Oct-1960 Today's Date: 12/25/2019    History of present illness 59 year old female smoker admitted 3/10 with overdose complicated by aspiration PNA with MRSA,  Pneumococcal bacteremia, toxic metabolic encephalopathy and septic shock.  Frequent falls at home,Tobacco abuse, diabetes with painful polyneuropathy, bipolar disease, GERD, HIV, schizophrenia, history of IV drug abuse, seizure disorder. Intubated 3/10-3/29 3/23 MRI brain >>Acute nonhemorrhagic infarcts in the posterior MCA territory bilaterally. Tracheostomy placed 3/29   OT comments  Pt participating well in therapy. Attention remains poor with slow processing. Needs eye contact to attend and follow commands. Pt recalling conversation with PT about an adult coloring book. Focus of session on bed mobility, sit<>stand and transfers. Pt with improved spontaneous use of L UE when attempting to don her socks, but hand remains non functional. Improved task persistence with washing face and applying lotion to hands. Encouraged UE AROM through seated dancing. RN made aware that pt needs trach care.  Follow Up Recommendations  SNF;Supervision/Assistance - 24 hour    Equipment Recommendations  Other (comment)(defer to next venue)    Recommendations for Other Services      Precautions / Restrictions Precautions Precautions: Fall Precaution Comments: bilateral mitts and UE restraints. PEG. Trach collar 28%FiO2       Mobility Bed Mobility Overal bed mobility: Needs Assistance Bed Mobility: Supine to Sit     Supine to sit: +2 for physical assistance;Mod assist Sit to supine: Mod assist;+2 for physical assistance   General bed mobility comments: mod assist for LEs over EOB, mod assist +2 to raise trunk  Transfers Overall transfer level: Needs assistance Equipment used: 2 person hand held assist;Rolling walker (2 wheeled) Transfers: Sit to/from  [facility] Sit to Stand: +2 physical assistance;Mod assist Stand pivot transfers: +2 physical assistance;Min assist       General transfer comment: Pt transferred x 2 with B hand held assist, worked on sit<>stand from chair with RW x 3    Balance Overall balance assessment: Needs assistance Sitting-balance support: Feet supported Sitting balance-Leahy Scale: Fair Sitting balance - Comments: no LOB seated at EOB or in chair without support while engaged in grooming and dancing     Standing balance-Leahy Scale: Poor Standing balance comment: stands for up to 10 seconds with RW before fatiguing and impulsively returning to sit                           ADL either performed or assessed with clinical judgement   ADL Overall ADL's : Needs assistance/impaired     Grooming: Wash/dry hands;Wash/dry face;Sitting;Supervision/safety Grooming Details (indicate cue type and reason): seated in chair             Lower Body Dressing: Maximal assistance;Sitting/lateral leans Lower Body Dressing Details (indicate cue type and reason): pt attempted to use L UE to don her socks, but ineffective use of L hand Toilet Transfer: +2 for physical assistance;Stand-pivot;Moderate assistance Toilet Transfer Details (indicate cue type and reason): simulated bed to chair and back           General ADL Comments: pt performed sit to stand x 3 with +2 mod assist from chair, worked on UE strengthening by encouraging pt to dance raising her arms     Vision       Perception     Praxis      Cognition Arousal/Alertness: Awake/alert Behavior During Therapy: Impulsive;Restless Overall Cognitive Status: Impaired/Different  from baseline Area of Impairment: Orientation;Attention;Memory;Following commands;Safety/judgement;Awareness;Problem solving                 Orientation Level: Disoriented to Current Attention Level: Focused Memory: Decreased short-term  memory;Decreased recall of precautions Following Commands: Follows one step commands with increased time;Follows one step commands inconsistently Safety/Judgement: Decreased awareness of safety;Decreased awareness of deficits Awareness: Intellectual Problem Solving: Decreased initiation;Difficulty sequencing;Requires verbal cues;Requires tactile cues;Slow processing General Comments: pt responds well to eye contact prior when given directions        Exercises     Shoulder Instructions       General Comments      Pertinent Vitals/ Pain       Pain Assessment: Faces Faces Pain Scale: No hurt  Home Living                                          Prior Functioning/Environment              Frequency  Min 2X/week        Progress Toward Goals  OT Goals(current goals can now be found in the care plan section)  Progress towards OT goals: Progressing toward goals  Acute Rehab OT Goals Patient Stated Goal: to color OT Goal Formulation: With patient Time For Goal Achievement: 01/05/20 Potential to Achieve Goals: Good  Plan Discharge plan remains appropriate    Co-evaluation    PT/OT/SLP Co-Evaluation/Treatment: Yes Reason for Co-Treatment: Necessary to address cognition/behavior during functional activity;Complexity of the patient's impairments (multi-system involvement);For patient/therapist safety   OT goals addressed during session: ADL's and self-care;Strengthening/ROM      AM-PAC OT "6 Clicks" Daily Activity     Outcome Measure   Help from another person eating meals?: Total Help from another person taking care of personal grooming?: A Little Help from another person toileting, which includes using toliet, bedpan, or urinal?: Total Help from another person bathing (including washing, rinsing, drying)?: A Lot Help from another person to put on and taking off regular upper body clothing?: A Lot Help from another person to put on and taking  off regular lower body clothing?: Total 6 Click Score: 10    End of Session Equipment Utilized During Treatment: Gait belt;Oxygen  OT Visit Diagnosis: Other symptoms and signs involving cognitive function;Muscle weakness (generalized) (M62.81);Unsteadiness on feet (R26.81);Other abnormalities of gait and mobility (R26.89) Hemiplegia - Right/Left: Left Hemiplegia - caused by: Cerebral infarction   Activity Tolerance Patient tolerated treatment well   Patient Left in bed;with call bell/phone within reach;with restraints reapplied;with bed alarm set   Nurse Communication Other (comment)(aware trach needs care)        Time: [id number] OT Time Calculation (min): 39 min  Charges: OT General Charges $OT Visit: 1 Visit OT Treatments $Therapeutic Activity: 8-22 mins  Nestor Lewandowsky, OTR/L Acute Rehabilitation Services Pager: [phone] Office: [phone]   Malka So 12/25/2019, 12:35 PM

## 2019-12-26 DIAGNOSIS — J9601 Acute respiratory failure with hypoxia: Secondary | ICD-10-CM | POA: Diagnosis not present

## 2019-12-26 LAB — GLUCOSE, CAPILLARY
Glucose-Capillary: 115 mg/dL — ABNORMAL HIGH (ref 70–99)
Glucose-Capillary: 125 mg/dL — ABNORMAL HIGH (ref 70–99)
Glucose-Capillary: 165 mg/dL — ABNORMAL HIGH (ref 70–99)
Glucose-Capillary: 166 mg/dL — ABNORMAL HIGH (ref 70–99)
Glucose-Capillary: 170 mg/dL — ABNORMAL HIGH (ref 70–99)
Glucose-Capillary: 208 mg/dL — ABNORMAL HIGH (ref 70–99)

## 2019-12-26 LAB — CULTURE, RESPIRATORY W GRAM STAIN

## 2019-12-26 NOTE — Progress Notes (Signed)
Pt pulled trach out respiratory was called.Marland KitchenRespirtatory replace trach pt maintained O2 stats 95-100 all through out emergency. Pt remains agitated Meds were given per MAR.

## 2019-12-26 NOTE — Plan of Care (Signed)
  Problem: Clinical Measurements: Goal: Ability to maintain clinical measurements within normal limits will improve Outcome: Progressing   

## 2019-12-26 NOTE — Progress Notes (Signed)
PROGRESS NOTE  Susan [name]  [id number] DOB: 1960/09/16 DOA: 11/11/2019 PCP: Hoyt Koch, MD   Brief Narrative: Susan [name] is a 59 year old female with a history of HIV, diabetes with neuropathy, seizure disorder, tobacco use, bipolar disease, schizophrenia, history of IV drug abuse admitted 3/10 to ICU with overdose in shock with aspiration pneumonia with MRSA, pneumococcal bacteremia, toxic metabolic encephalopathy, septic shock and acute versus subacute bilateral MCA infarcts. Patient was intubated on 3/10, difficult to wean off, tracheostomy placed on 3/29. Was treated for MRSA pneumonia and massive volume overload with lasix infusion. Decreased movement and mentation was noted and head CT on 3/22 showed bilateral acute vs. subacute MCA infarcts confirmed on MRI 3/23. [facility] assumed care on 12/06/2019. Disposition is complicated by persistent encephalopathy requiring restraints to protect life-sustaining medical devices including trach and PEG. Psychiatry has been consulted for assistance with medication management.  Assessment & Plan: Principal Problem:   Acute respiratory failure with hypoxemia (HCC) Active Problems:   HIV disease (HCC)   Schizoaffective disorder, bipolar type ([place])   Drug-induced hypotension   DNR (do not resuscitate) discussion   AKI (acute kidney injury) ([place])   Pneumococcal bacteremia   Pneumonia   Aortic valve endocarditis   Acute kidney injury (AKI) with acute tubular necrosis (ATN) (HCC)   Acute metabolic encephalopathy   Cerebral embolism with cerebral infarction   Status post tracheostomy ([place])   Palliative care by specialist   Adult failure to thrive   Agitation   Acute metabolic encephalopathy/hospital delirium: Large CVAs causing brain injury and complicated by pre-existing schizophrenia and bipolar disorder.  - Home medications include seroquel, cogentin, zoloft, buspar, doxepin, neurontin. - Patient is still requiring  soft restraints at times due to agitation and difficulty following commands/complying with requests - high risk for pulling out feeding tube this am as it was wrapped around her left arm as she was banging it against the bed - Psychiatry following 1-2 times per week, recommending downward titration of Seroquel with new addition of Zyprexa. -Patient mood improving over the past 24 hours, lengthy discussion daily about need for compliance, unfortunately unclear how much patient remembers day today -We will trial patient out of mittens and restraints today and follow clinically, this is likely her most limiting factor as far as disposition is concerned.  Acute hypoxic/hypercapnic respiratory failure with ARDS from MRSA pneumonia and aspiration pneumonia with difficulty extubating, history of asthma unlikely acute exacerbation:  - s/p ETT 3/10, tracheostomy 3/29. - PCCM following for tracheostomy care, currently #6 cuffless (since 4/12) undergoing capping trials/tolerating PMV. PCCM following 1-2x/wk, considering decannulation soon. - Scheduled and prn breathing treatments -Chest x-ray taken previously shows possible right-sided infiltrate, however patient remains without fever or leukocytosis, likely aspiration given patient's mental status and dysphagia as below status post PEG tube, hold antibiotics at this time as likely aspiration pneumonitis rather than fulminant infection.  Certainly can initiate antibiotics should she have fevers or leukocytosis  Dysphagia s/p PEG tube placement:  -Speech therapy following, advance diet per recommendations, tolerating PEG tube feedings and medication administration without issue.  Aerococcus bacteremia 3/25 Klebsiella bronchitis/pneumonia 3/25 -Completed cefepime on 4/1  Acute large bilateral posterior MCA infarcts 3/23: TEE 3/16 without CES. MRI brain c/w b/l posterior MCA territory infarcts; MRA/ MRV neg - Continue ASA, statin.  - Needs continued PT/OT/SLP  -disposition pending likely SNF versus LTAC given needs.  Acute systolic heart failure with sepsis induced CM, HTN: Following, no longer requiring regular diuresis.  -  Currently on clonidine, amlodipine, triamterene/HCTZ, metoprolol  HLD:  - Continue crestor  Poorly-controlled T2DM: HbA1c 12.3%.  - Continue basal-bolus insulin/CBGs. At inpatient goal.  Hyponatremia: Resolved. - Decreased free water, monitor BMP  Hypokalemia:  - Resolved, continue to supplement as necessary  HIV: CD4 440, VL 20 in March 2021. - Continue dolutegravir, emtricitabine/tenofovir  At risk malnutrition: S/p PEG placement on 4/5, by IR - Continue with tube feeds as tolerated.  Obesity: Body mass index is 34.21 kg/m.  - Lifestyle modification advised once able to participate in conversation; currently on tube feeds, will discuss calorie goals with dietary  GOC: Very poor prognosis due to multiple comorbidities - Palliative consulted and had been following  DVT prophylaxis: SCD's Code Status: Full Family Communication: Daughter by phone previously Status: Inpatient  Remains inpatient appropriate because:Unsafe d/c plan as pt still requires restraints for her own safety  Dispo: The patient is from: Home  Anticipated d/c is to: SNF vs LTAC  Anticipated d/c date is: TBD - pending clinical/mood improvement, bed availability, and insurance approval  Patient currently is not medically stable to d/c. Requires control of delirium enough to no longer require restraints.  Consultants:   PCCM  ID  Neurology  Procedures:   Mechanical ventilation  TEE  Trach changed to cuffless 4/12  Subjective: Patient remains difficult to orient, episode of combativeness overnight attempted to kick nursing staff.  Reoriented, this morning patient sitting up in bedside chair with physical therapy more pleasant, continues to have limited review of systems as patient is  communicative but has difficulty with word finding  Objective: Vitals:   12/25/19 2357 12/26/19 0016 12/26/19 0403 12/26/19 0416  BP: 123/67  116/60   Pulse: 77 74 72 72  Resp: 16 16 16 18   Temp: 98.6 F (37 C)  97.9 F (36.6 C)   TempSrc: Axillary  Axillary   SpO2: 98%  98%   Weight:   87.6 kg   Height:        Intake/Output Summary (Last 24 hours) at 12/26/2019 0700 Last data filed at 12/25/2019 1940 Gross per 24 hour  Intake 3100 ml  Output 700 ml  Net 2400 ml   Filed Weights   12/24/19 0434 12/25/19 0408 12/26/19 0403  Weight: 89.1 kg 87.7 kg 87.6 kg   Gen: 59 y.o. female in no distress, sitting up in bedside chair Pulm: Nonlabored breathing, no wheezes or crackles. CV: Regular rate and rhythm. No murmur, rub, or gallop. No JVD, no dependent edema. GI: Abdomen soft, non-tender, obese but non-distended, with normoactive bowel sounds.  Ext: Warm, no deformities or edema Skin: PEG site with slough, underneath is healthy wound edges without erythema, induration or tenderness. Thick dark brown secretions around trach site. No other rashes, lesions or ulcers on visualized skin. Neuro: Noted exam, speech improving but still has word finding difficulty, moving all 4 extremities  Data Reviewed: I have personally reviewed following labs and imaging studies  CBC: Recent Labs  Lab 12/21/19 0425 12/23/19 0500 12/24/19 0452  WBC 11.1* 10.7* 10.4  HGB 10.7* 10.8* 11.0*  HCT 34.1* 34.5* 35.5*  MCV 89.0 88.7 89.4  PLT 269 335 0000000   Basic Metabolic Panel: Recent Labs  Lab 12/20/19 0611 12/21/19 0425 12/22/19 0500 12/23/19 0500 12/24/19 0452  NA 134* 133* 136 137 136  K 3.4* 3.8 3.6 3.8 3.7  CL 87* 87* 91* 90* 88*  CO2 35* 34* 34* 36* 35*  GLUCOSE 158* 247* 119* 109* 126*  BUN 39* 41*  42* 45* 45*  CREATININE 1.04* 1.16* 1.16* 1.08* 1.12*  CALCIUM 9.5 9.4 9.5 9.6 9.6   GFR: Estimated Creatinine Clearance: 58.2 mL/min (A) (by C-G formula based on SCr of 1.12 mg/dL  (H)). Liver Function Tests: Recent Labs  Lab 12/20/19 0611  AST 26  ALT 27  ALKPHOS 110  BILITOT 0.2*  PROT 8.3*  ALBUMIN 2.2*   No results for input(s): LIPASE, AMYLASE in the last 168 hours. No results for input(s): AMMONIA in the last 168 hours. Coagulation Profile: No results for input(s): INR, PROTIME in the last 168 hours. Cardiac Enzymes: No results for input(s): CKTOTAL, CKMB, CKMBINDEX, TROPONINI in the last 168 hours. BNP (last 3 results) No results for input(s): PROBNP in the last 8760 hours. HbA1C: No results for input(s): HGBA1C in the last 72 hours. CBG: Recent Labs  Lab 12/25/19 1242 12/25/19 1652 12/25/19 2021 12/25/19 2352 12/26/19 0401  GLUCAP 232* 165* 160* 125* 115*   Lipid Profile: No results for input(s): CHOL, HDL, LDLCALC, TRIG, CHOLHDL, LDLDIRECT in the last 72 hours. Thyroid Function Tests: No results for input(s): TSH, T4TOTAL, FREET4, T3FREE, THYROIDAB in the last 72 hours. Anemia Panel: No results for input(s): VITAMINB12, FOLATE, FERRITIN, TIBC, IRON, RETICCTPCT in the last 72 hours. Urine analysis:    Component Value Date/Time   COLORURINE YELLOW 11/26/2019 1522   APPEARANCEUR CLEAR 11/26/2019 1522   LABSPEC 1.011 11/26/2019 1522   PHURINE 8.0 11/26/2019 1522   GLUCOSEU NEGATIVE 11/26/2019 1522   HGBUR NEGATIVE 11/26/2019 1522   BILIRUBINUR NEGATIVE 11/26/2019 1522   KETONESUR NEGATIVE 11/26/2019 1522   PROTEINUR 30 (A) 11/26/2019 1522   UROBILINOGEN 1.0 06/01/2015 0104   NITRITE NEGATIVE 11/26/2019 1522   LEUKOCYTESUR NEGATIVE 11/26/2019 1522   Recent Results (from the past 240 hour(s))  Culture, respiratory (non-expectorated)     Status: None (Preliminary result)   Collection Time: 12/24/19 10:55 AM   Specimen: Tracheal Aspirate; Respiratory  Result Value Ref Range Status   Specimen Description TRACHEAL ASPIRATE  Final   Special Requests NONE  Final   Gram Stain   Final    FEW WBC PRESENT,BOTH PMN AND MONONUCLEAR RARE  GRAM POSITIVE COCCI IN PAIRS    Culture   Final    MODERATE STAPHYLOCOCCUS AUREUS SUSCEPTIBILITIES TO FOLLOW Performed at [facility], [place] [address], [place], [place] [address]    Report Status PENDING  Incomplete      Radiology Studies: DG Chest Port 1 View  Result Date: 12/24/2019 CLINICAL DATA:  Pneumonia EXAM: PORTABLE CHEST 1 VIEW COMPARISON:  12/19/2019 FINDINGS: Tracheostomy tube noted. Hazy airspace opacity at the right lung base and to a lesser extent in the right upper lobe with diffuse interstitial accentuation. Pattern similar to 12/19/2019. Right PICC line tip: SVC. IMPRESSION: Hazy airspace opacity at the right lung base and to a lesser extent in the right upper lobe, suspicious for pneumonia. Background interstitial accentuation noted. Overall appearance similar to prior. Electronically Signed   By: Van Clines M.D.   On: 12/24/2019 10:32    Scheduled Meds: . amLODipine  10 mg Per Tube Daily  . aspirin  81 mg Per Tube Daily  . benztropine  0.5 mg Per Tube Daily  . budesonide (PULMICORT) nebulizer solution  0.5 mg Nebulization BID  . busPIRone  15 mg Per Tube TID  . chlorhexidine  15 mL Mouth Rinse BID  . Chlorhexidine Gluconate Cloth  6 each Topical Daily  . cloNIDine  0.2 mg Per Tube TID  .  dolutegravir  50 mg Oral Daily  . doxepin  50 mg Per Tube QHS  . emtricitabine-tenofovir AF  1 tablet Per Tube Daily  . enoxaparin (LOVENOX) injection  40 mg Subcutaneous Q24H  . feeding supplement (PRO-STAT SUGAR FREE 64)  30 mL Per Tube TID  . free water  200 mL Per Tube Q6H  . gabapentin  300 mg Per Tube Q8H  . insulin aspart  0-20 Units Subcutaneous Q4H  . insulin glargine  10 Units Subcutaneous BID  . mouth rinse  15 mL Mouth Rinse q12n4p  . metoprolol tartrate  12.5 mg Per Tube BID  . OLANZapine  7.5 mg Oral QHS  . pantoprazole sodium  40 mg Per Tube Daily  . polyethylene glycol  17 g Oral Daily  . QUEtiapine  200 mg Per Tube BID  . rosuvastatin  20 mg  Per Tube q1800  . sennosides  15 mL Per Tube BID  . sertraline  50 mg Per Tube Daily  . triamterene-hydrochlorothiazide  1 tablet Per Tube Daily   Continuous Infusions: . sodium chloride Stopped (12/07/19 0216)  . feeding supplement (JEVITY 1.2 CAL) 1,000 mL (12/22/19 1848)     LOS: 45 days   Time spent: 25 minutes.  Little Ishikawa, DO [facility] [email or web address] 12/26/2019, 7:00 AM

## 2019-12-27 DIAGNOSIS — J9601 Acute respiratory failure with hypoxia: Secondary | ICD-10-CM | POA: Diagnosis not present

## 2019-12-27 DIAGNOSIS — R451 Restlessness and agitation: Secondary | ICD-10-CM | POA: Diagnosis not present

## 2019-12-27 DIAGNOSIS — Z515 Encounter for palliative care: Secondary | ICD-10-CM | POA: Diagnosis not present

## 2019-12-27 DIAGNOSIS — F250 Schizoaffective disorder, bipolar type: Secondary | ICD-10-CM | POA: Diagnosis not present

## 2019-12-27 LAB — GLUCOSE, CAPILLARY
Glucose-Capillary: 107 mg/dL — ABNORMAL HIGH (ref 70–99)
Glucose-Capillary: 201 mg/dL — ABNORMAL HIGH (ref 70–99)
Glucose-Capillary: 204 mg/dL — ABNORMAL HIGH (ref 70–99)
Glucose-Capillary: 212 mg/dL — ABNORMAL HIGH (ref 70–99)
Glucose-Capillary: 218 mg/dL — ABNORMAL HIGH (ref 70–99)
Glucose-Capillary: 239 mg/dL — ABNORMAL HIGH (ref 70–99)
Glucose-Capillary: 249 mg/dL — ABNORMAL HIGH (ref 70–99)

## 2019-12-27 LAB — CBC
HCT: 35.2 % — ABNORMAL LOW (ref 36.0–46.0)
Hemoglobin: 11 g/dL — ABNORMAL LOW (ref 12.0–15.0)
MCH: 27.5 pg (ref 26.0–34.0)
MCHC: 31.3 g/dL (ref 30.0–36.0)
MCV: 88 fL (ref 80.0–100.0)
Platelets: 522 10*3/uL — ABNORMAL HIGH (ref 150–400)
RBC: 4 MIL/uL (ref 3.87–5.11)
RDW: 16.3 % — ABNORMAL HIGH (ref 11.5–15.5)
WBC: 10.9 10*3/uL — ABNORMAL HIGH (ref 4.0–10.5)
nRBC: 0 % (ref 0.0–0.2)

## 2019-12-27 LAB — BASIC METABOLIC PANEL
Anion gap: 13 (ref 5–15)
BUN: 48 mg/dL — ABNORMAL HIGH (ref 6–20)
CO2: 33 mmol/L — ABNORMAL HIGH (ref 22–32)
Calcium: 9.6 mg/dL (ref 8.9–10.3)
Chloride: 87 mmol/L — ABNORMAL LOW (ref 98–111)
Creatinine, Ser: 1.12 mg/dL — ABNORMAL HIGH (ref 0.44–1.00)
GFR calc Af Amer: >60 mL/min (ref >60)
GFR calc non Af Amer: 54 mL/min — ABNORMAL LOW (ref >60)
Glucose, Bld: 266 mg/dL — ABNORMAL HIGH (ref 70–99)
Potassium: 3.5 mmol/L (ref 3.5–5.1)
Sodium: 133 mmol/L — ABNORMAL LOW (ref 135–145)
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
## 2019-12-27 NOTE — Plan of Care (Signed)
  Problem: Education: Goal: Knowledge of General Education information will improve Description: Including pain rating scale, medication(s)/side effects and non-pharmacologic comfort measures Outcome: Not Progressing   Problem: Health Behavior/Discharge Planning: Goal: Ability to manage health-related needs will improve Outcome: Not Progressing   Problem: Clinical Measurements: Goal: Ability to maintain clinical measurements within normal limits will improve Outcome: Not Progressing Goal: Will remain free from infection Outcome: Not Progressing Goal: Diagnostic test results will improve Outcome: Not Progressing   Problem: Activity: Goal: Risk for activity intolerance will decrease Outcome: Not Progressing   Problem: Coping: Goal: Level of anxiety will decrease Outcome: Not Progressing   Problem: Safety: Goal: Ability to remain free from injury will improve Outcome: Not Progressing   Problem: Skin Integrity: Goal: Risk for impaired skin integrity will decrease Outcome: Not Progressing   Problem: Education: Goal: Knowledge of disease or condition will improve Outcome: Not Progressing Goal: Knowledge of secondary prevention will improve Outcome: Not Progressing Goal: Knowledge of patient specific risk factors addressed and post discharge goals established will improve Outcome: Not Progressing

## 2019-12-28 DIAGNOSIS — J9601 Acute respiratory failure with hypoxia: Secondary | ICD-10-CM | POA: Diagnosis not present

## 2019-12-28 DIAGNOSIS — J15212 Pneumonia due to Methicillin resistant Staphylococcus aureus: Secondary | ICD-10-CM | POA: Diagnosis not present

## 2019-12-28 LAB — GLUCOSE, CAPILLARY
Glucose-Capillary: 173 mg/dL — ABNORMAL HIGH (ref 70–99)
Glucose-Capillary: 149 mg/dL — ABNORMAL HIGH (ref 70–99)
Glucose-Capillary: 219 mg/dL — ABNORMAL HIGH (ref 70–99)
Glucose-Capillary: 178 mg/dL — ABNORMAL HIGH (ref 70–99)
Glucose-Capillary: 144 mg/dL — ABNORMAL HIGH (ref 70–99)
Glucose-Capillary: 216 mg/dL — ABNORMAL HIGH (ref 70–99)

## 2019-12-28 MED ORDER — VANCOMYCIN HCL 2000 MG/400ML IV SOLN
2000.0000 mg | Freq: Once | INTRAVENOUS | Status: AC
  Administered 2019-12-28: 2000 mg via INTRAVENOUS
  Filled 2019-12-28: qty 400

## 2019-12-28 MED ORDER — VANCOMYCIN HCL IN DEXTROSE 1-5 GM/200ML-% IV SOLN
1000.0000 mg | INTRAVENOUS | Status: AC
  Administered 2019-12-29 – 2020-01-03 (×6): 1000 mg via INTRAVENOUS
  Filled 2019-12-28 (×6): qty 200

## 2019-12-28 NOTE — Progress Notes (Signed)
Pharmacy Antibiotic Note  Susan [name] is a 59 y.o. female admitted on 11/11/2019 with MRSA pneumonia.  Pharmacy has been consulted for Vancomycin dosing.   Tracheal aspirate from 4/22 growing MRSA. Antibiotics were initially held as suspected colonizer. WBC up at 10.9. Patient remains afebrile.  MD today wants to treat x 7 days. SCr has been stable at 1.12 with estimated CrCl ~58 mL/min. Patient is making urine - not well documented.   Plan: Vancomycin 2000mg  IV x1, then 1000mg  IV every 24 hours.  Goal AUC 400-550. Expected AUC: 534 SCr used: 1.12 Total of 7 days.   Height: 5\' 3"  (160 cm) Weight: 88.2 kg (194 lb 7.1 oz) IBW/kg (Calculated) : 52.4  Temp (24hrs), Avg:98.7 F (37.1 C), Min:98.6 F (37 C), Max:98.8 F (37.1 C)  Recent Labs  Lab 12/22/19 0500 12/23/19 0500 12/24/19 0452 12/27/19 0648  WBC  --  10.7* 10.4 10.9*  CREATININE 1.16* 1.08* 1.12* 1.12*    Estimated Creatinine Clearance: 58.4 mL/min (A) (by C-G formula based on SCr of 1.12 mg/dL (H)).    Allergies  Allergen Reactions  . Lyrica [Pregabalin] Swelling    Has LE swelling    Antimicrobials this admission: Vanc 3/10 >>3/17; 4/26 >> Unasyn 3/10 >> 3/11 Zosyn x1 3/10, 3/11 >>3/12 PCN 3/16 >> 3/23 Cefepime 3/26 >> 4/1   Microbiology results: 3/10 covid / flu - negative 3/10 MRSA PCR - positive 3/10BCx - 1/4 Strep pneumo  3/10UCx - negative 3/10 TA - MRSA 3/12 BCx - negative 3/25 TA - Kleb oxytoca (S Ancef, Cipro, Septra, Unasyn) 3/25 BCx - 2/2 Aerococcus spp 4/22 TA>>MRSA  Thank you for allowing pharmacy to be a part of this patient's care.  Sloan Leiter, PharmD, BCPS, BCCCP Clinical Pharmacist Please refer to [facility] for [place] numbers 12/28/2019 9:50 AM

## 2019-12-28 NOTE — Procedures (Signed)
Tracheostomy Change Note  Patient Details:   Name: Susan [name] DOB: [birth year] MRN: [id number]    Airway Documentation:     Evaluation  O2 sats: stable throughout Complications: No apparent complications Patient did tolerate procedure well. Bilateral Breath Sounds: Diminished, Rhonchi    Gonzella Lex 12/28/2019, 2:57 PM

## 2019-12-28 NOTE — Progress Notes (Signed)
Attempted capping trach per MD order.  Pt had audible stridor and SPO2 dropped from 100% to 89% with good waveform.  Will attempt again tomorrow

## 2019-12-28 NOTE — Progress Notes (Signed)
NAME:  Susan [name], MRN:  [id number], DOB:  September 29, 1960, LOS: 17 ADMISSION DATE:  11/11/2019, CONSULTATION DATE:  3/10 REFERRING MD:  Sabra Heck, CHIEF COMPLAINT:  Acute encephalopathy and respiratory failure    Brief History   59 year old female smoker admitted 3/10 with overdose complicated by aspiration PNA with MRSA,  Pneumococcal bacteremia, toxic metabolic encephalopathy and septic shock.    Past Medical History  Frequent falls at home,Tobacco abuse, diabetes with painful polyneuropathy, bipolar disease, GERD, HIV, schizophrenia, history of IV drug abuse, seizure disorder.   Significant Hospital Events   3/10 Admit with overdose, intubated on arrival, in shock   3/15 Ventilator asynchrony, heavy sedation & intermittent paralysis   3/16 Lasix drip.  Awaiting TEE.  Sputum w/ MRSA.   Coming back down on sedation, changed to [facility]. TEE showing NO EVIDENCE of endocarditis. ABX changed to pen G per ID. MRSA therapy complete. EF improved. Now up to 50% from 20-25% 3/17 Heavily sedated. Having episodes of bradycardia. Still massively overloaded (>14L), increasing lasix gtt to 8 mg from 4, adding metolazone.  3/19 Lasix to 40 every 12, changing RASS goal to -2, decrease in fentanyl, decreasing PEEP, anticipating trialing pressure support ventilation.  Watching upward trend of white blood cell count and low-grade fever 3/20 BM early am. Agitation early am > sedate post am PT psy meds 3/22 Decreased mentation and movement of extremities overnight, sent for Head CT which revealed acute vs subacute bilateral MCA infarcts  3/23 Lying in bed seen spontaneously moving lower and right upper extremity, unable to follow any commands.  3/24 Hypoglycemic episodes overnight, Weaning well 10/5 today, tmax 101.5 Remains on precedex gtt 1.2 3/29 tracheostomy placed 4/12 #6 shiley cuffed exchanged for #6 cuffless.  4/14 PMV with SLP 4/15 Plan for MBS  Consults:  ID Cards for TEE Neurology  Palliative  care  Procedures:  ETT 3/10 >>  R Fem TLC (ER) 3/10  R IJ TLC 3/10  L IJ TLC 3/10 >> 3/17  RUE PICC 3/16 >>  Trach 3/29 >>  PEG 4/05 >>   Significant Diagnostic Tests:   CT brain 3/10 >> Normal head CT  EEG 3/10 >> Moderate to severe diffuse encephalopathy. No seizures  Urine drug screen 3/10 >> positive for THC, acetaminophen Q000111Q, salicylate <7, ETOH Q000111Q  ECHO 3/11 >> EF 20-25%, LV function severely diminished, Global Hypokinesis,LV hypertrophy, Grade 1 Diastolic Dysfunction, Elevated PASP, Dilated LA, MV regurgitation, small mobile density right coronary cusp  TEE 3/16 >> LVEF 55%, No LA/LAA thrombus or masses, Previously noted mass on the aortic valve is a node of Arancius.  Normal variant and no evidence of endocarditis.Trivial TR and trivial PR 3/23 MRI brain >>Acute nonhemorrhagic infarcts in the posterior MCA territory bilaterally. Otherwise no evidence of chronic ischemia. 3/23 MRA head >>No significant intracranial stenosis.  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 3/23 MRV head >> negative  Micro Data:  Influenza A/B 3/10 >> negative  COVID 3/10 >> negative  MRSA PCR 3/10 >> positive  BCx2 3/10 >> streptococcus pneumoniae >> S-rocephin BCx1 (aerobic only) 3/10 >> negative  Tracheal aspirate 3/11 >> MRSA BCx2 3/12 >> negative  BCx2 3/25 >> aerococcus  Resp cx 3/25 >> Klebsiella (R amp)   Antimicrobials:  Vancomycin 3/10 >> 3/16 Unasyn 3/10 >> 3/16 Pen G 3/16 >> 3/23 Cefepime 3/26 >> 3/31  Interim history/subjective:  Very pleasantly confused this AM. Wants something to eat. Denies pain. Remains on TC.  Objective   BP [name])  109/51 (BP Location: Left Arm)   Pulse 89   Temp 98.7 F (37.1 C) (Oral)   Resp 19   Ht 5\' 3"  (1.6 m)   Wt 88.2 kg   LMP  (LMP Unknown)   SpO2 100%   BMI 34.44 kg/m   I/O last 3 completed shifts: In: 1200 [NG/GT:1200] Out: -     Examination:  GEN: no acute distress HEENT: trach in place with  thick tan secretions CV: RRR, ext warm PULM: Scattered rhonci, no accessory muscle use GI: Soft, PEG in place with TF ongoing EXT: safety mitts in place, minimal edema NEURO: moves all 4 ext to command, globally weak PSYCH: AO x 1 SKIN: No rashes   Resolved Hospital Problem list   Rhabdomyolysis, Elevated INR, Septic shock, MRSA and aspiration pneumonia, and Pneumococcal bacteremia, Aerococcus bacteremia, Klebsiella HCAP ARDS from MRSA PNA Aspiration PNA  Aerococcus bacteremia Klebsiella PNA   Assessment & Plan:   Acute respiratory failure 2/2 PNA requiring intubation, now s/p tracheostomy  Hx of asthma. Acute bilateral large bilateral posterior MCA infarcts Hx seizures, hx anoxic injury-supportive care  Schizophrenia, acute encephalopathy- seroquel, zoloft, buspar, cogentin, doxepin, gabapentin, PRN restraints HIV- tivicay, descovy  Acute systolic heart failure with sepsis induced CM - s/p TEE and without signs of endocarditis HTN, HLD DM II, poorly controlled A1c 12.3 - SSI + basal  Obesity, At risk malnutrition- s/p PEG 4/5.   Discussion  Working w/ SLP but requiring redirection  Psych has seen pt. Making adjustments for aggression and schizophrenia.  Tracheal aspirate growing MRSA  Plan - Vanc x 7 days, linezolid runs too high risk of serotonin syndrome, if nearing DC can consider switch to doxycycline - Downsize and cap trach - Continue to work with SLP - Will check on her tomorrow  Erskine Emery MD PCCM
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
## 2019-12-29 DIAGNOSIS — J9601 Acute respiratory failure with hypoxia: Secondary | ICD-10-CM | POA: Diagnosis not present

## 2019-12-29 LAB — GLUCOSE, CAPILLARY
Glucose-Capillary: 106 mg/dL — ABNORMAL HIGH (ref 70–99)
Glucose-Capillary: 153 mg/dL — ABNORMAL HIGH (ref 70–99)
Glucose-Capillary: 173 mg/dL — ABNORMAL HIGH (ref 70–99)
Glucose-Capillary: 204 mg/dL — ABNORMAL HIGH (ref 70–99)
Glucose-Capillary: 242 mg/dL — ABNORMAL HIGH (ref 70–99)
Glucose-Capillary: 260 mg/dL — ABNORMAL HIGH (ref 70–99)

## 2019-12-29 MED ORDER — HALOPERIDOL LACTATE 5 MG/ML IJ SOLN
5.0000 mg | Freq: Once | INTRAMUSCULAR | Status: AC
  Administered 2019-12-29: 5 mg via INTRAVENOUS
  Filled 2019-12-29: qty 1

## 2019-12-29 MED ORDER — INSULIN GLARGINE 100 UNIT/ML ~~LOC~~ SOLN
15.0000 [IU] | Freq: Two times a day (BID) | SUBCUTANEOUS | Status: DC
  Administered 2019-12-29 – 2020-01-09 (×22): 15 [IU] via SUBCUTANEOUS
  Filled 2019-12-29 (×23): qty 0.15

## 2019-12-29 MED ORDER — HALOPERIDOL LACTATE 5 MG/ML IJ SOLN
5.0000 mg | Freq: Three times a day (TID) | INTRAMUSCULAR | Status: DC | PRN
  Administered 2020-01-03 (×2): 5 mg via INTRAVENOUS
  Filled 2019-12-29 (×3): qty 1

## 2019-12-29 MED ORDER — RESOURCE THICKENUP CLEAR PO POWD
ORAL | Status: DC | PRN
  Filled 2019-12-29 (×2): qty 125

## 2019-12-29 MED ORDER — JEVITY 1.2 CAL PO LIQD
1000.0000 mL | ORAL | Status: DC
  Administered 2019-12-29 – 2020-01-04 (×7): 1000 mL
  Filled 2019-12-29 (×9): qty 1000

## 2019-12-29 NOTE — Progress Notes (Signed)
Physical Therapy Treatment Patient Details Name: Susan [name] MRN: [id number] DOB: Aug 22, 1961 Today's Date: 12/29/2019    History of Present Illness 59 year old female smoker admitted 3/10 with overdose complicated by aspiration PNA with MRSA,  Pneumococcal bacteremia, toxic metabolic encephalopathy and septic shock.  Frequent falls at home,Tobacco abuse, diabetes with painful polyneuropathy, bipolar disease, GERD, HIV, schizophrenia, history of IV drug abuse, seizure disorder. Intubated 3/10-3/29 3/23 MRI brain >>Acute nonhemorrhagic infarcts in the posterior MCA territory bilaterally. Tracheostomy placed 3/29    PT Comments    Pt very fidgety and impulsive.  In restraints for safety.  Eager to participate with therapy, but quick to fatigue once participating.  Emphasis on transition to EOB, sitting balance, sit to stand and 3 trials of pre gait with 4-5 side steps up toward [facility] with the RW and assist.    Follow Up Recommendations  SNF     Equipment Recommendations  Other (comment);None recommended by PT(TBA)    Recommendations for Other Services       Precautions / Restrictions Precautions Precautions: Fall Precaution Comments: bilateral mitts and UE restraints. PEG. Trach collar 28%FiO2 Restrictions Weight Bearing Restrictions: No    Mobility  Bed Mobility Overal bed mobility: Needs Assistance Bed Mobility: Supine to Sit     Supine to sit: Min assist     General bed mobility comments: To ensure balance and safety as pt is impulsive.   Transfers Overall transfer level: Needs assistance Equipment used: Rolling walker (2 wheeled) Transfers: Sit to/from Stand Sit to Stand: Min assist;+2 physical assistance;+2 safety/equipment         General transfer comment: Assist to power up with RW. Cues for safety.   Ambulation/Gait Ambulation/Gait assistance: Min assist;+2 physical assistance Gait Distance (Feet): 3 Feet         General Gait Details: side stepping L  in the RW up toward the [facility].  Assist for the RW and stability and cues for keeping focus on task.   Stairs             Wheelchair Mobility    Modified Rankin (Stroke Patients Only) Modified Rankin (Stroke Patients Only) Modified Rankin: Moderately severe disability     Balance Overall balance assessment: Needs assistance Sitting-balance support: Feet supported Sitting balance-Leahy Scale: Fair Sitting balance - Comments: sat EOB x6-8 min     Standing balance-Leahy Scale: Poor(Poor to fair) Standing balance comment: stood x 3, work on w/shifting, then stepping in place then working toward side stepping each of 3 trials, respectively.                            Cognition Arousal/Alertness: Awake/alert Behavior During Therapy: Impulsive;Restless Overall Cognitive Status: Impaired/Different from baseline Area of Impairment: Orientation;Attention;Memory;Following commands;Safety/judgement;Awareness;Problem solving                 Orientation Level: Disoriented to;Place;Time;Situation Current Attention Level: Focused Memory: Decreased short-term memory;Decreased recall of precautions Following Commands: Follows one step commands with increased time;Follows one step commands inconsistently Safety/Judgement: Decreased awareness of safety;Decreased awareness of deficits Awareness: Intellectual Problem Solving: Decreased initiation;Difficulty sequencing;Requires verbal cues;Requires tactile cues;Slow processing General Comments: Pt with telesitter in room. Pt has been hallucinating. Pt very impulsive requiring mod to max cues to avoid pulling at lines.       Exercises Other Exercises Other Exercises: warm up LE ROM    General Comments General comments (skin integrity, edema, etc.): No signs/symptoms of distress. SpO2 maintained in 90s throughout.  Pertinent Vitals/Pain Pain Assessment: No/denies pain    Home Living                       Prior Function            PT Goals (current goals can now be found in the care plan section) Acute Rehab PT Goals PT Goal Formulation: With patient Time For Goal Achievement: 01/12/20 Potential to Achieve Goals: Fair Progress towards PT goals: Progressing toward goals(continue with goals)    Frequency    Min 3X/week      PT Plan Current plan remains appropriate    Co-evaluation PT/OT/SLP Co-Evaluation/Treatment: Yes Reason for Co-Treatment: Necessary to address cognition/behavior during functional activity PT goals addressed during session: Mobility/safety with mobility OT goals addressed during session: ADL's and self-care      AM-PAC PT "6 Clicks" Mobility   Outcome Measure  Help needed turning from your back to your side while in a flat bed without using bedrails?: A Little Help needed moving from lying on your back to sitting on the side of a flat bed without using bedrails?: A Little Help needed moving to and from a bed to a chair (including a wheelchair)?: A Lot Help needed standing up from a chair using your arms (e.g., wheelchair or bedside chair)?: A Little Help needed to walk in hospital room?: A Lot Help needed climbing 3-5 steps with a railing? : Total 6 Click Score: 14    End of Session   Activity Tolerance: Patient tolerated treatment well;Patient limited by fatigue Patient left: in bed;with bed alarm set;with restraints reapplied;with call bell/phone within reach Nurse Communication: Mobility status PT Visit Diagnosis: Unsteadiness on feet (R26.81);Other abnormalities of gait and mobility (R26.89);Repeated falls (R29.6);Muscle weakness (generalized) (M62.81);History of falling (Z91.81);Difficulty in walking, not elsewhere classified (R26.2);Hemiplegia and hemiparesis Hemiplegia - Right/Left: Left Hemiplegia - dominant/non-dominant: Non-dominant Hemiplegia - caused by: Cerebral infarction     Time: 1204-1240 PT Time Calculation (min) (ACUTE ONLY):  36 min  Charges:  $Therapeutic Activity: 8-22 mins                     12/29/2019  Susan [name]., PT Acute Rehabilitation Services [phone]  (pager) [phone]  (office)   Susan [name] Susan [name] 12/29/2019, 5:42 PM
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
## 2019-12-29 NOTE — Progress Notes (Signed)
NAME:  Susan [name], MRN:  [id number], DOB:  [birth year], LOS: 49 ADMISSION DATE:  11/11/2019, CONSULTATION DATE:  3/10 REFERRING MD:  Sabra Heck, CHIEF COMPLAINT:  Acute encephalopathy and respiratory failure    Brief History   59 year old female smoker admitted 3/10 with overdose complicated by aspiration PNA with MRSA,  Pneumococcal bacteremia, toxic metabolic encephalopathy and septic shock.    Past Medical History  Frequent falls at home,Tobacco abuse, diabetes with painful polyneuropathy, bipolar disease, GERD, HIV, schizophrenia, history of IV drug abuse, seizure disorder.   Significant Hospital Events   3/10 Admit with overdose, intubated on arrival, in shock   3/15 Ventilator asynchrony, heavy sedation & intermittent paralysis   3/16 Lasix drip.  Awaiting TEE.  Sputum w/ MRSA.   Coming back down on sedation, changed to [facility]. TEE showing NO EVIDENCE of endocarditis. ABX changed to pen G per ID. MRSA therapy complete. EF improved. Now up to 50% from 20-25% 3/17 Heavily sedated. Having episodes of bradycardia. Still massively overloaded (>14L), increasing lasix gtt to 8 mg from 4, adding metolazone.  3/19 Lasix to 40 every 12, changing RASS goal to -2, decrease in fentanyl, decreasing PEEP, anticipating trialing pressure support ventilation.  Watching upward trend of white blood cell count and low-grade fever 3/20 BM early am. Agitation early am > sedate post am PT psy meds 3/22 Decreased mentation and movement of extremities overnight, sent for Head CT which revealed acute vs subacute bilateral MCA infarcts  3/23 Lying in bed seen spontaneously moving lower and right upper extremity, unable to follow any commands.  3/24 Hypoglycemic episodes overnight, Weaning well 10/5 today, tmax 101.5 Remains on precedex gtt 1.2 3/29 tracheostomy placed 4/12 #6 shiley cuffed exchanged for #6 cuffless.  4/14 PMV with SLP 4/15 Plan for MBS  Consults:  ID Cards for TEE Neurology  Palliative  care  Procedures:  ETT 3/10 >>  R Fem TLC (ER) 3/10  R IJ TLC 3/10  L IJ TLC 3/10 >> 3/17  RUE PICC 3/16 >>  Trach 3/29 >>  PEG 4/05 >>   Significant Diagnostic Tests:   CT brain 3/10 >> Normal head CT  EEG 3/10 >> Moderate to severe diffuse encephalopathy. No seizures  Urine drug screen 3/10 >> positive for THC, acetaminophen Q000111Q, salicylate <7, ETOH Q000111Q  ECHO 3/11 >> EF 20-25%, LV function severely diminished, Global Hypokinesis,LV hypertrophy, Grade 1 Diastolic Dysfunction, Elevated PASP, Dilated LA, MV regurgitation, small mobile density right coronary cusp  TEE 3/16 >> LVEF 55%, No LA/LAA thrombus or masses, Previously noted mass on the aortic valve is a node of Arancius.  Normal variant and no evidence of endocarditis.Trivial TR and trivial PR 3/23 MRI brain >>Acute nonhemorrhagic infarcts in the posterior MCA territory bilaterally. Otherwise no evidence of chronic ischemia. 3/23 MRA head >>No significant intracranial stenosis.  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 3/23 MRV head >> negative  Micro Data:  Influenza A/B 3/10 >> negative  COVID 3/10 >> negative  MRSA PCR 3/10 >> positive  BCx2 3/10 >> streptococcus pneumoniae >> S-rocephin BCx1 (aerobic only) 3/10 >> negative  Tracheal aspirate 3/11 >> MRSA BCx2 3/12 >> negative  BCx2 3/25 >> aerococcus  Resp cx 3/25 >> Klebsiella (R amp)   Antimicrobials:  Vancomycin 3/10 >> 3/16 Unasyn 3/10 >> 3/16 Pen G 3/16 >> 3/23 Cefepime 3/26 >> 3/31  Interim history/subjective:  Downsized yesterday, some stridor with capping so held off. She has been a bit unruly overnight per notes. She  tells me her daughter is coming and she is talking to the wall.  Objective   BP 128/85 (BP Location: Left Arm)   Pulse 98   Temp 98.6 F (37 C) (Oral)   Resp 12   Ht 5\' 3"  (1.6 m)   Wt 89 kg   LMP  (LMP Unknown)   SpO2 97%   BMI 34.76 kg/m   I/O last 3 completed shifts: In: 2201.1 [NG/GT:1800; IV  Piggyback:401.1] Out: -     Examination:  GEN: no acute distress HEENT: trach in place with thick tan secretions CV: RRR, ext warm PULM: Scattered rhonci, no accessory muscle use GI: Soft, PEG in place with TF ongoing EXT: safety mitts in place, minimal edema NEURO: moves all 4 ext to command, globally weak, able to phone easily and breath around trach PSYCH: AO x 1 SKIN: No rashes   Resolved Hospital Problem list   Rhabdomyolysis, Elevated INR, Septic shock, MRSA and aspiration pneumonia, and Pneumococcal bacteremia, Aerococcus bacteremia, Klebsiella HCAP ARDS from MRSA PNA Aspiration PNA  Aerococcus bacteremia Klebsiella PNA   Assessment & Plan:   Acute respiratory failure 2/2 PNA requiring intubation, now s/p tracheostomy  Hx of asthma. Acute bilateral large bilateral posterior MCA infarcts Hx seizures, hx anoxic injury-supportive care  Schizophrenia, acute encephalopathy- seroquel, zoloft, buspar, cogentin, doxepin, gabapentin, PRN restraints HIV- tivicay, descovy  Acute systolic heart failure with sepsis induced CM - s/p TEE and without signs of endocarditis HTN, HLD DM II, poorly controlled A1c 12.3 - SSI + basal  Obesity, At risk malnutrition- s/p PEG 4/5.   Discussion  Working w/ SLP but requiring redirection  Psych has seen pt. Making adjustments for aggression and schizophrenia.  Tracheal aspirate growing MRSA  Plan - Vanc x 7 days - Cap trach today - Continue to work with SLP - Will consider decannulation tomorrow depending on how she does  Erskine Emery MD PCCM

## 2019-12-29 NOTE — TOC Progression Note (Addendum)
Transition of Care (TOC) - Progression Note    Patient Details  Name: Susan [name] of Birth: 1960-11-23  Transition of Care [facility]) CM/SW Contact  Angelita Ingles, RN Phone Number: 12/29/2019, 3:55 PM  Clinical Narrative: CM following for discharge disposition needs. Patient currently remains unsafe to have restraints discontinued. Unable to pursue SNF placement at this time. Will continue to follow.    01/04/2020 1400 CM made aware that patient is out of restraints and now medically stable for discharge. Notified She[facility] from [facility] to determine if bed is still available. Adela Lank states that she is unsure that bed is still available because she made bed offer months ago. FL2 has been updated and resubmitted. Currently no bed offers. Daughter has been notified and made aware that if [place] is the only offer we will have to proceed with placement at [facility]. Daughter is in agreement.   01/05/2020 1040 CM has sent message to [facility] to ask is they will accept the patient. Answer pending  01/05/2020 1059 [facility] has retracted their bed offer. MD made aware.  01/05/2020 1500 CM at bedside to visit patient. FL2 has been updated. CM reached out to [facility] to see if facility may reconsider admit due to previous decline due to trach. Facility has agreed to review info again. Several bed request have been submitted via the [place].   01/06/2020 1317 CM has been waiting to hear from [facility] in reference to bed offer. [facility] has now declined and no other bed offers have been received. Cm called daughter Thayer Headings to update her on bed offers and ask daughter her thoughts on mothers disposition. CM asked daughter was she still interested in bringing her mother home. Daughter states that she is afraid because the doctor explained the risk of infection due to the tube in her mothers stomach and mental stability. The daughter states that she was told by a MD that she would have to come to classes  several times per week for teaching. Daughter is unable to give the name of the MD or identify which classes. CM explained to daughter that search would have to be extended for bed offers. Daughter verbalized understanding and states that she is going to talk with her siblings and come to visit her mother to see if she is able to bring her home.   Expected Discharge Plan: Skilled Nursing Facility Barriers to Discharge: Continued Medical Work up  Expected Discharge Plan and Services Expected Discharge Plan: [place]                                               Social Determinants of Health (SDOH) Interventions    Readmission Risk Interventions Readmission Risk Prevention Plan 02/19/2019  Transportation Screening Complete  Medication Review Press photographer) Complete  PCP or Specialist appointment within 3-5 days of discharge Complete  HRI or [place] Complete  SW Recovery Care/Counseling Consult Complete  [place] Not Applicable  Some recent data might be hidden

## 2019-12-29 NOTE — Progress Notes (Signed)
Occupational Therapy Treatment Patient Details Name: Susan [name] MRN: [id number] DOB: 05-May-1961 Today's Date: 12/29/2019    History of present illness 59 year old female smoker admitted 3/10 with overdose complicated by aspiration PNA with MRSA,  Pneumococcal bacteremia, toxic metabolic encephalopathy and septic shock.  Frequent falls at home,Tobacco abuse, diabetes with painful polyneuropathy, bipolar disease, GERD, HIV, schizophrenia, history of IV drug abuse, seizure disorder. Intubated 3/10-3/29 3/23 MRI brain >>Acute nonhemorrhagic infarcts in the posterior MCA territory bilaterally. Tracheostomy placed 3/29   OT comments  Cotx with PT to maximize pt safety and functional performance. Pt making progress in therapy, demonstrating improved activity tolerance and independence with self-care tasks. Pt continues to require constant cues for safety as pt is restless and impulsive, pulling at lines. Pt able to don socks in long sitting with min assist requiring cues to incorporate LUE with poor follow through. Pt tolerated sitting EOB ~15 min with variable supervision to min assist for balance and safety. Pt stood x 3 with min assist x 2, able to take 2 side steps up towards the [facility]. Pt sits without warning despite safety cues. Pt completed simple hand and face washing task while seated EOB, perseverating on cleaning out nose. Pt assisted back to bed and positioned for comfort. Pt sleeping before therapy left room. OT will continue to follow acutely.    Follow Up Recommendations  SNF;Supervision/Assistance - 24 hour    Equipment Recommendations  Other (comment)(TBD at next venue)    Recommendations for Other Services      Precautions / Restrictions Precautions Precautions: Fall Precaution Comments: bilateral mitts and UE restraints. PEG. Trach collar 28%FiO2 Restrictions Weight Bearing Restrictions: No       Mobility Bed Mobility Overal bed mobility: Needs Assistance Bed Mobility:  Supine to Sit     Supine to sit: Min assist     General bed mobility comments: To ensure balance and safety as pt is impulsive.   Transfers Overall transfer level: Needs assistance Equipment used: Rolling walker (2 wheeled) Transfers: Sit to/from Stand Sit to Stand: Min assist;+2 physical assistance;+2 safety/equipment         General transfer comment: Assist to power up with RW. Cues for safety.     Balance Overall balance assessment: Needs assistance Sitting-balance support: Feet supported Sitting balance-Leahy Scale: Fair       Standing balance-Leahy Scale: Poor(Poor to fair)                             ADL either performed or assessed with clinical judgement   ADL Overall ADL's : Needs assistance/impaired Eating/Feeding: NPO   Grooming: Wash/dry hands;Wash/dry face;Supervision/safety;Sitting Grooming Details (indicate cue type and reason): While seated EOB. Perseverating on cleaning out nose.              Lower Body Dressing: Minimal assistance;Bed level Lower Body Dressing Details (indicate cue type and reason): Pt able to complete in long sitting in bed. Pt able to don RLE sock with supervision, requiring min assist to start LLE sock over feet. Pt required cues to incorporate use of LUE into task with poor follow through.              Functional mobility during ADLs: Minimal assistance;+2 for physical assistance;+2 for safety/equipment;Rolling walker General ADL Comments: Pt completed sit to stand x 3 with min assist x 2. Pt able to take 2 side steps up towards the [facility]. Pt sits unexpectantly.  Vision       Perception     Praxis      Cognition Arousal/Alertness: Awake/alert Behavior During Therapy: Impulsive;Restless Overall Cognitive Status: Impaired/Different from baseline Area of Impairment: Orientation;Attention;Memory;Following commands;Safety/judgement;Awareness;Problem solving                 Orientation Level:  Disoriented to;Place;Time;Situation Current Attention Level: Focused Memory: Decreased short-term memory;Decreased recall of precautions Following Commands: Follows one step commands with increased time;Follows one step commands inconsistently Safety/Judgement: Decreased awareness of safety;Decreased awareness of deficits Awareness: Intellectual Problem Solving: Decreased initiation;Difficulty sequencing;Requires verbal cues;Requires tactile cues;Slow processing General Comments: Pt with telesitter in room. Pt has been hallucinating. Pt very impulsive requiring mod to max cues to avoid pulling at lines.         Exercises     Shoulder Instructions       General Comments No signs/symptoms of distress. SpO2 maintained in 90s throughout.     Pertinent Vitals/ Pain       Pain Assessment: No/denies pain  Home Living                                          Prior Functioning/Environment              Frequency           Progress Toward Goals  OT Goals(current goals can now be found in the care plan section)  Progress towards OT goals: Progressing toward goals  ADL Goals Pt Will Perform Grooming: with mod assist;sitting Additional ADL Goal #1: Pt will complete grooming task with mod A while sitting EOB for 5-10 minutes with min postural support Additional ADL Goal #2: Pt will follow one step command with <3 VC's to wash face while sitting up in chair  Plan Discharge plan remains appropriate    Co-evaluation    PT/OT/SLP Co-Evaluation/Treatment: Yes Reason for Co-Treatment: Necessary to address cognition/behavior during functional activity;For patient/therapist safety;To address functional/ADL transfers   OT goals addressed during session: ADL's and self-care      AM-PAC OT "6 Clicks" Daily Activity     Outcome Measure   Help from another person eating meals?: Total Help from another person taking care of personal grooming?: A Little Help from  another person toileting, which includes using toliet, bedpan, or urinal?: Total Help from another person bathing (including washing, rinsing, drying)?: A Lot Help from another person to put on and taking off regular upper body clothing?: A Lot Help from another person to put on and taking off regular lower body clothing?: Total 6 Click Score: 10    End of Session Equipment Utilized During Treatment: Rolling walker;Oxygen  OT Visit Diagnosis: Other symptoms and signs involving cognitive function;Muscle weakness (generalized) (M62.81);Unsteadiness on feet (R26.81);Other abnormalities of gait and mobility (R26.89)   Activity Tolerance Patient limited by fatigue   Patient Left in bed;with call bell/phone within reach;with bed alarm set;with restraints reapplied;Other (comment)(telesitter present)   Nurse Communication Mobility status        Time: [phone] OT Time Calculation (min): 36 min  Charges: OT General Charges $OT Visit: 1 Visit OT Treatments $Self Care/Home Management : 8-22 mins  Mauri Brooklyn OTR/L [phone]   Mauri Brooklyn 12/29/2019, 2:17 PM

## 2019-12-29 NOTE — Progress Notes (Signed)
Pt throwing legs over rails. Tele sitter, CNA, this RN and medical restraint not working. Asked On call provider for leg restraints.

## 2019-12-29 NOTE — Progress Notes (Signed)
Tele sitter is REFUSING to watch pt. Stating that she isn't a fall risk in 4 pt restraints. This nurse tried to explain that she is still a fall risk and risk at pulling out trach which may compromise airway. On call provider is aware.

## 2019-12-29 NOTE — Progress Notes (Signed)
Nutrition Follow-up  DOCUMENTATION CODES:   Obesity unspecified  INTERVENTION:   Transition to nocturnal tube feeds via PEG: - Jevity 1.2 @ 60 ml/hr for 12 hours from 2000 to 0800 (total of 720 ml) - Continue Pro-stat 30 ml TID - Continue free water 200 ml q 6 hours  Nocturnal tube feeding regimen and current free water provides 1164 kcal, 85 grams of protein, and 1381 ml of H2O. Meets 68% of kcal and 81% of protein needs.  - Magic cup TID with meals, each supplement provides 290 kcal and 9 grams of protein  - Vital Cuisine Shake TID, each supplement provides 520 kcal and 22 grams of protein  NUTRITION DIAGNOSIS:   Inadequate oral intake related to inability to eat as evidenced by NPO status.  Progressing, pt now on Dysphagia 1 diet with nectar-thick liquids  GOAL:   Patient will meet greater than or equal to 90% of their needs  Progressing  MONITOR:   PO intake, Supplement acceptance, Diet advancement, Labs, Weight trends, TF tolerance  REASON FOR ASSESSMENT:   Ventilator, Consult Enteral/tube feeding initiation and management  ASSESSMENT:   59 yo female admitted S/P fall at home, unresponsive, hypotensive. Intubated by EMS. Suspected overdose with toxic/metabolic encephalopathy and aspiration PNA. PMH includes schizophrenia, HIV, DM, neuropathy, HTN.  3/22 - CT head positive for stroke 3/23 -MRI revealed bilateral acute infarcts in the posterior MCA territory 3/29 - s/p trach 4/05 - s/p PEG 4/06 - Cortrak removed 4/15 - s/p MBSS recommend continue NPO with trial of nectar and puree with SLP 4/27 - s/p MBSS with diet advanced to dysphagia 1 and nectar-thick liquids  Discussed pt with RN and MD. Diet advanced this morning. Plan is to transition pt from continuous tube feeds to nocturnal tube feeds to stimulate appetite and PO intake during the daytime. Also discussed with SLP. RD will order thickened oral nutrition supplements to aid pt in meeting kcal and  protein needs.  RN reports pt consumed 25% of lunch meal tray.  Current weight: 89 kg Admission weight: 114 kg  Suspect some of weight loss can be attributed to fluid shifts. Pt continues to have mild pitting edema to BUE and BLE. Will monitor weight trends.  Current TF: Jevity 1.2 @ 50 ml/hr, Pro-stat 30 ml TID, free water 200 ml q 6 hours  Medications reviewed and include: SSI q 4 hours, Lantus 15 units BID, protonix, miralax, senokot, IV abx  Labs reviewed: sodium 133 (4/25) CBG's: 153-260 x 24 hours  Diet Order:   Diet Order            DIET - DYS 1 Room service appropriate? No; Fluid consistency: Nectar Thick  Diet effective now              EDUCATION NEEDS:   Not appropriate for education at this time  Skin:  Skin Assessment: Skin Integrity Issues: Other: non-pressure wound buttocks  Last BM:  12/28/19 large type 6  Height:   Ht Readings from Last 1 Encounters:  11/17/19 5\' 3"  (1.6 m)    Weight:   Wt Readings from Last 1 Encounters:  12/29/19 89 kg    Ideal Body Weight:  52.3 kg  BMI:  Body mass index is 34.76 kg/m.  Estimated Nutritional Needs:   Kcal:  1700-1900  Protein:  105-130 gm  Fluid:  >/= 1.7 L    Gaynell Face, MS, RD, LDN Inpatient Clinical Dietitian Pager: [phone] Weekend/After Hours: [phone]
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
## 2019-12-29 NOTE — Progress Notes (Addendum)
  Speech Language Pathology Treatment: Dysphagia  Patient Details Name: Susan [name] MRN: [id number] DOB: 05-Nov-1960 Today's Date: 12/29/2019 Time: [id number] SLP Time Calculation (min) (ACUTE ONLY): 23 min  Assessment / Plan / Recommendation Clinical Impression  Pt's trach is now capped verbalizing with adequate intensity and intelligibility. ST working toward initiation of puree/nectar from [facility] recommendations. At that time pt was not safe to receive daily food trays. Restraint removed and pt self fed bites pudding and nectar thick juice- no overt indications of aspiration over session. She followed commands but will need full supervision with recommended Dys 1 (puree) texture, nectar thick liquids due to impulsivity. Recommend crush meds, sit upright and ST will continue to see for safety and efficiency. Pt may need modifications in tube feedings to promote oral intake (?)   HPI HPI: 59 year old female smoker admitted 3/10 with overdose complicated by aspiration PNA with MRSA,  Pneumococcal bacteremia, toxic metabolic encephalopathy and septic shock. MRI 3/23 revealed Acute nonhemorrhagic infarcts in the posterior MCA territory bilaterally. Pt recieved PEG and has not been appropriate for BSE until present.        SLP Plan  Continue with current plan of care       Recommendations  Diet recommendations: Dysphagia 1 (puree);Nectar-thick liquid Liquids provided via: Cup;No straw Medication Administration: Crushed with puree Supervision: Patient able to self feed;Full supervision/cueing for compensatory strategies Compensations: Minimize environmental distractions;Slow rate;Small sips/bites Postural Changes and/or Swallow Maneuvers: Seated upright 90 degrees                Oral Care Recommendations: Oral care BID Follow up Recommendations: Skilled Nursing facility;LTACH SLP Visit Diagnosis: Dysphagia, pharyngeal phase (R13.13) Plan: Continue with current plan of care        GO                [name] 12/29/2019, 1:35 PM   [name] Caroli.Ed Risk analyst [phone] Office [phone]

## 2019-12-30 DIAGNOSIS — J9601 Acute respiratory failure with hypoxia: Secondary | ICD-10-CM | POA: Diagnosis not present

## 2019-12-30 LAB — CBC
HCT: 35.7 % — ABNORMAL LOW (ref 36.0–46.0)
Hemoglobin: 11.4 g/dL — ABNORMAL LOW (ref 12.0–15.0)
MCH: 27.5 pg (ref 26.0–34.0)
MCHC: 31.9 g/dL (ref 30.0–36.0)
MCV: 86 fL (ref 80.0–100.0)
Platelets: 587 10*3/uL — ABNORMAL HIGH (ref 150–400)
RBC: 4.15 MIL/uL (ref 3.87–5.11)
RDW: 16 % — ABNORMAL HIGH (ref 11.5–15.5)
WBC: 10.1 10*3/uL (ref 4.0–10.5)
nRBC: 0 % (ref 0.0–0.2)

## 2019-12-30 LAB — GLUCOSE, CAPILLARY
Glucose-Capillary: 266 mg/dL — ABNORMAL HIGH (ref 70–99)
Glucose-Capillary: 231 mg/dL — ABNORMAL HIGH (ref 70–99)
Glucose-Capillary: 96 mg/dL (ref 70–99)
Glucose-Capillary: 264 mg/dL — ABNORMAL HIGH (ref 70–99)
Glucose-Capillary: 243 mg/dL — ABNORMAL HIGH (ref 70–99)
Glucose-Capillary: 94 mg/dL (ref 70–99)

## 2019-12-30 LAB — BASIC METABOLIC PANEL
Anion gap: 12 (ref 5–15)
BUN: 42 mg/dL — ABNORMAL HIGH (ref 6–20)
CO2: 32 mmol/L (ref 22–32)
Calcium: 9.6 mg/dL (ref 8.9–10.3)
Chloride: 93 mmol/L — ABNORMAL LOW (ref 98–111)
Creatinine, Ser: 1.02 mg/dL — ABNORMAL HIGH (ref 0.44–1.00)
GFR calc Af Amer: >60 mL/min (ref >60)
GFR calc non Af Amer: >60 mL/min (ref >60)
Glucose, Bld: 165 mg/dL — ABNORMAL HIGH (ref 70–99)
Potassium: 3.4 mmol/L — ABNORMAL LOW (ref 3.5–5.1)
Sodium: 137 mmol/L (ref 135–145)

## 2019-12-30 NOTE — Progress Notes (Signed)
NAME:  Susan [name], MRN:  [id number], DOB:  1960-12-13, LOS: 58 ADMISSION DATE:  11/11/2019, CONSULTATION DATE:  3/10 REFERRING MD:  Sabra Heck, CHIEF COMPLAINT:  Acute encephalopathy and respiratory failure    Brief History   59 year old female smoker admitted 3/10 with overdose complicated by aspiration PNA with MRSA,  Pneumococcal bacteremia, toxic metabolic encephalopathy and septic shock.    Past Medical History  Frequent falls at home,Tobacco abuse, diabetes with painful polyneuropathy, bipolar disease, GERD, HIV, schizophrenia, history of IV drug abuse, seizure disorder.   Significant Hospital Events   3/10 Admit with overdose, intubated on arrival, in shock   3/15 Ventilator asynchrony, heavy sedation & intermittent paralysis   3/16 Lasix drip.  Awaiting TEE.  Sputum w/ MRSA.   Coming back down on sedation, changed to [facility] TEE showing NO EVIDENCE of endocarditis. ABX changed to pen G per ID. MRSA therapy complete. EF improved. Now up to 50% from 20-25% 3/17 Heavily sedated. Having episodes of bradycardia. Still massively overloaded (>14L), increasing lasix gtt to 8 mg from 4, adding metolazone.  3/19 Lasix to 40 every 12, changing RASS goal to -2, decrease in fentanyl, decreasing PEEP, anticipating trialing pressure support ventilation.  Watching upward trend of white blood cell count and low-grade fever 3/20 BM early am. Agitation early am > sedate post am PT psy meds 3/22 Decreased mentation and movement of extremities overnight, sent for Head CT which revealed acute vs subacute bilateral MCA infarcts  3/23 Lying in bed seen spontaneously moving lower and right upper extremity, unable to follow any commands.  3/24 Hypoglycemic episodes overnight, Weaning well 10/5 today, tmax 101.5 Remains on precedex gtt 1.2 3/29 tracheostomy placed 4/12 #6 shiley cuffed exchanged for #6 cuffless.  4/14 PMV with SLP 4/15 Plan for MBS  Consults:  ID Cards for TEE Neurology  Palliative  care  Procedures:  ETT 3/10 >>  R Fem TLC (ER) 3/10  R IJ TLC 3/10  L IJ TLC 3/10 >> 3/17  RUE PICC 3/16 >>  Trach 3/29 >>  PEG 4/05 >>   Significant Diagnostic Tests:   CT brain 3/10 >> Normal head CT  EEG 3/10 >> Moderate to severe diffuse encephalopathy. No seizures  Urine drug screen 3/10 >> positive for THC, acetaminophen [id number], salicylate <7, ETOH [id number]  ECHO 3/11 >> EF 20-25%, LV function severely diminished, Global Hypokinesis,LV hypertrophy, Grade 1 Diastolic Dysfunction, Elevated PASP, Dilated LA, MV regurgitation, small mobile density right coronary cusp  TEE 3/16 >> LVEF 55%, No LA/LAA thrombus or masses, Previously noted mass on the aortic valve is a node of Arancius.  Normal variant and no evidence of endocarditis.Trivial TR and trivial PR 3/23 MRI brain >>Acute nonhemorrhagic infarcts in the posterior MCA territory bilaterally. Otherwise no evidence of chronic ischemia. 3/23 MRA head >>No significant intracranial stenosis.  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 3/23 MRV head >> negative  Micro Data:  Influenza A/B 3/10 >> negative  COVID 3/10 >> negative  MRSA PCR 3/10 >> positive  BCx2 3/10 >> streptococcus pneumoniae >> S-rocephin BCx1 (aerobic only) 3/10 >> negative  Tracheal aspirate 3/11 >> MRSA BCx2 3/12 >> negative  BCx2 3/25 >> aerococcus  Resp cx 3/25 >> Klebsiella (R amp)   Antimicrobials:  Vancomycin 3/10 >> 3/16 Unasyn 3/10 >> 3/16 Pen G 3/16 >> 3/23 Cefepime 3/26 >> 3/31  Interim history/subjective:  Screaming this AM.  Will not let me examine her. No issues with capping overnight.  Objective  BP 123/65 (BP Location: Left Arm)   Pulse (!) 46   Temp 98 F (36.7 C) (Oral)   Resp 15   Ht 5\' 3"  (1.6 m)   Wt 89 kg   LMP  (LMP Unknown)   SpO2 97%   BMI 34.76 kg/m   I/O last 3 completed shifts: In: 26 [P.O.:180; IV Piggyback:200] Out: -     Examination:  Agitated woman screaming at me Good  speech Trach in place   Resolved Hospital Problem list   Rhabdomyolysis, Elevated INR, Septic shock, MRSA and aspiration pneumonia, and Pneumococcal bacteremia, Aerococcus bacteremia, Klebsiella HCAP ARDS from MRSA PNA Aspiration PNA  Aerococcus bacteremia Klebsiella PNA   Assessment & Plan:   Acute respiratory failure 2/2 PNA requiring intubation, now s/p tracheostomy  Hx of asthma. Acute bilateral large bilateral posterior MCA infarcts Hx seizures, hx anoxic injury-supportive care  Schizophrenia, acute encephalopathy- seroquel, zoloft, buspar, cogentin, doxepin, gabapentin, PRN restraints HIV- tivicay, descovy  Acute systolic heart failure with sepsis induced CM - s/p TEE and without signs of endocarditis HTN, HLD DM II, poorly controlled A1c 12.3 - SSI + basal  Obesity, At risk malnutrition- s/p PEG 4/5.   Discussion  -Doing well with capping. -Marni Griffon decannulated her because she does not trust me. -Keep occlusive dressing in place until stoma closes, try to have her hold pressure with coughing and talking although her psych issues may limit this. -If no stoma closure in 4 weeks would send to ENT for surgical closure -PCCM will sign off, call if need further help  Erskine Emery MD PCCM

## 2019-12-30 NOTE — Progress Notes (Signed)
PT has been decannulated. Sats are stable RT to monitor

## 2019-12-30 NOTE — Plan of Care (Signed)
  Problem: Skin Integrity: Goal: Risk for impaired skin integrity will decrease Outcome: Progressing   Problem: Education: Goal: Knowledge of General Education information will improve Description: Including pain rating scale, medication(s)/side effects and non-pharmacologic comfort measures Outcome: Not Progressing   Problem: Health Behavior/Discharge Planning: Goal: Ability to manage health-related needs will improve Outcome: Not Progressing   Problem: Clinical Measurements: Goal: Ability to maintain clinical measurements within normal limits will improve Outcome: Not Progressing Goal: Will remain free from infection Outcome: Not Progressing Goal: Diagnostic test results will improve Outcome: Not Progressing   Problem: Activity: Goal: Risk for activity intolerance will decrease Outcome: Not Progressing   Problem: Coping: Goal: Level of anxiety will decrease Outcome: Not Progressing   Problem: Safety: Goal: Ability to remain free from injury will improve Outcome: Not Progressing   Problem: Education: Goal: Knowledge of disease or condition will improve Outcome: Not Progressing Goal: Knowledge of secondary prevention will improve Outcome: Not Progressing Goal: Knowledge of patient specific risk factors addressed and post discharge goals established will improve Outcome: Not Progressing

## 2019-12-30 NOTE — Plan of Care (Signed)

## 2019-12-30 NOTE — Progress Notes (Signed)
PT de canulated by DR Tamala Julian

## 2019-12-30 NOTE — Progress Notes (Signed)
  Speech Language Pathology Treatment: Dysphagia  Patient Details Name: Susan [name] MRN: [id number] DOB: [birth year] Today's Date: 12/30/2019 Time: [id number] SLP Time Calculation (min) (ACUTE ONLY): 22 min  Assessment / Plan / Recommendation Clinical Impression  Pt is pleasant this afternoon with good vocal quality after decannulation earlier today. No air leak is noted during conversational speech, although she was observed x2 to forcefully exhale as if she were bearing down, at which time she did clearly have air coming from her dressed stoma. Per RN pt has not been liking most of the pureed foods. SLP provided advanced trials of soft, bite-sized solids along with nectar thick liquids. SLP provided Mod A for rate control, but otherwise pt had appropriate bolus preparation and no overt s/s of aspiration. Her mastication appears mildly prolonged but still functional considering her lack of dentition. Also considering her mentation and documented impulsivity, would advance diet to Dys 2 (chopped) solids and nectar thick liquids. Will continue to follow.   HPI HPI: 59 year old female smoker admitted 3/10 with overdose complicated by aspiration PNA with MRSA,  Pneumococcal bacteremia, toxic metabolic encephalopathy and septic shock. MRI 3/23 revealed Acute nonhemorrhagic infarcts in the posterior MCA territory bilaterally. Pt recieved PEG and has not been appropriate for BSE until present.        SLP Plan  Continue with current plan of care       Recommendations  Diet recommendations: Dysphagia 2 (fine chop);Nectar-thick liquid Liquids provided via: Cup;No straw Medication Administration: Crushed with puree Supervision: Patient able to self feed;Full supervision/cueing for compensatory strategies Compensations: Minimize environmental distractions;Slow rate;Small sips/bites Postural Changes and/or Swallow Maneuvers: Seated upright 90 degrees                Oral Care Recommendations:  Oral care BID Follow up Recommendations: Skilled Nursing facility;LTACH SLP Visit Diagnosis: Dysphagia, pharyngeal phase (R13.13) Plan: Continue with current plan of care       GO                Osie Bond., M.A. [place] [facility] Pager [phone] Office [phone]  12/30/2019, 3:16 PM

## 2019-12-30 NOTE — Progress Notes (Signed)
Pt had a much better night. She is direct opposite of my previous shifts note. Pt is very appreciative to eat and drink. Pt given, juice, coffee, and ice cream. Pt stated that she had gas but stated that she is okay.   Pt did spit up about 5cc of clear secretions. Pt tolerated eating and drinking per my shift. Day shift nurse is aware and is going to order her breakfast.

## 2019-12-30 NOTE — Progress Notes (Signed)
PROGRESS NOTE  ANE METHOD  [id number] DOB: [birth year] DOA: 11/11/2019 PCP: Hoyt Koch, MD   Brief Narrative: Susan [name] is a 59 year old female with a history of HIV, diabetes with neuropathy, seizure disorder, tobacco use, bipolar disease, schizophrenia, history of IV drug abuse admitted 3/10 to ICU with overdose in shock with aspiration pneumonia with MRSA, pneumococcal bacteremia, toxic metabolic encephalopathy, septic shock and acute versus subacute bilateral MCA infarcts. Patient was intubated on 3/10, difficult to wean off, tracheostomy placed on 3/29. Was treated for MRSA pneumonia and massive volume overload with lasix infusion. Decreased movement and mentation was noted and head CT on 3/22 showed bilateral acute vs. subacute MCA infarcts confirmed on MRI 3/23. [facility] assumed care on 12/06/2019. Disposition is complicated by persistent encephalopathy requiring restraints to protect life-sustaining medical devices including trach and PEG. Psychiatry has been consulted for assistance with medication management.  Patient successfully decannulated on 12/30/2019 now on room air, pending ability to remain without need for restraints will likely dictate patient's disposition options.  Assessment & Plan: Principal Problem:   Acute respiratory failure with hypoxemia (HCC) Active Problems:   HIV disease (HCC)   Schizoaffective disorder, bipolar type ([place])   Drug-induced hypotension   DNR (do not resuscitate) discussion   AKI (acute kidney injury) ([place])   Pneumococcal bacteremia   Pneumonia   Aortic valve endocarditis   Acute kidney injury (AKI) with acute tubular necrosis (ATN) (HCC)   Acute metabolic encephalopathy   Cerebral embolism with cerebral infarction   Status post tracheostomy ([place])   Palliative care by specialist   Adult failure to thrive   Agitation   Acute metabolic encephalopathy/hospital delirium: Large CVAs causing brain injury and complicated  by pre-existing schizophrenia and bipolar disorder. Likely at new baseline at this point.  - Home medications previously included seroquel, cogentin, zoloft, buspar, doxepin, neurontin. - Patient is still requiring soft restraints as well as PRN haldol at all times due to agitation and difficulty following commands/complying with requests -attempted to remove restraints over the weekend - patient removed trach requiring respiratory team to replace it, of note patient remained without hypoxia during the entire incident  -Patient continues to have hallucinations/confabulating events intermittently - Psychiatry following 1-2 times per week, Dr. Dwyane Dee now recommending discontinuation of Zyprexa and Zoloft, increase Haldol IV 5 mg every 8 hours as needed  -appreciate insight and recommendations  Acute hypoxic/hypercapnic respiratory failure with ARDS from MRSA pneumonia and aspiration pneumonia with difficulty extubating, history of asthma unlikely acute exacerbation:  - s/p ETT 3/10, tracheostomy 3/29 -decannulated 4/28 -Appreciate PCCM insight recommendations and assistance with this difficult case - Scheduled and prn breathing treatments -Chest x-ray taken previously shows possible right-sided infiltrate, however patient remains without fever with borderline leukocytosis likely aspiration given patient's mental status and dysphagia as below status post PEG tube -vancomycin per ICU team x7 days - stop date 01/04/20  Dysphagia s/p PEG tube placement:  -Speech therapy following, advance diet per recommendations, tolerating PEG tube feedings and medication administration without issue. -Advancing diet per speech recommendations, continue to follow clinically  Aerococcus bacteremia 3/25 Klebsiella bronchitis/pneumonia 3/25 -Completed cefepime on 4/1  Acute large bilateral posterior MCA infarcts 3/23: TEE 3/16 without CES. MRI brain c/w b/l posterior MCA territory infarcts; MRA/ MRV neg - Continue ASA,  statin.  - Needs continued PT/OT/SLP -disposition pending likely SNF versus LTAC given needs.  Acute systolic heart failure with sepsis induced CM, HTN: Following, no longer requiring regular diuresis.  - Currently  on clonidine, amlodipine, triamterene/HCTZ, metoprolol  HLD:  - Continue crestor  Poorly-controlled T2DM: HbA1c 12.3%.  - Increase lantus to 15 units twice daily, continue sliding scale insulin - Continue hypoglycemic protocol  Hyponatremia: Resolved. - Continue to titrate free water as necessary, monitor BMP as needed  Hypokalemia:  - Resolved, continue to supplement as necessary  HIV: CD4 440, VL 20 in March 2021. - Continue dolutegravir, emtricitabine/tenofovir  At risk malnutrition: S/p PEG placement on 4/5, by IR - Continue with tube feeds as tolerated.  Obesity: Body mass index is 34.76 kg/m.  - Lifestyle modification advised once able to participate in conversation; currently on tube feeds, will discuss calorie goals with dietary  Goals of care very poor prognosis due to multiple comorbidities - Palliative consulted and had been following  DVT prophylaxis: SCD's Code Status: Full Family Communication: Daughter updated by phone previously Status:  Remains inpatient appropriate because:Unsafe d/c plan as pt still requires restraints for her own safety Dispo: The patient is from: Home  Anticipated d/c is to: SNF vs LTAC  Anticipated d/c date is: TBD - pending clinical/mood improvement, bed availability, and insurance approval  Patient currently is not medically stable to d/c as she continues to require restraints to maintain her own safety given trach and PEG placement - will continue to trial out of restraints as psych titrates her medications and now the trach has been removed  Consultants:   PCCM  ID  Neurology  Procedures:   Mechanical ventilation  TEE  Trach changed to cuffless  4/12  Subjective: No acute issues or events overnight, patient continues to hallucinate/confabulate issues and episodes of events that did not occur.  She is very pleasant this morning, trach capped and patient able to speak in a more full sentence, declines chest pain shortness of breath nausea vomiting diarrhea constipation headache fevers or chills but review of systems is markedly limited given patient's mental status.  Objective: Vitals:   12/29/19 1601 12/29/19 1947 12/29/19 2300 12/29/19 2351  BP:  112/69 115/67 120/68  Pulse: 97 85 78   Resp: 18 (!) 21 18   Temp:  98.3 F (36.8 C) 98.6 F (37 C)   TempSrc:  Oral Oral   SpO2: 96% 96% 98%   Weight:      Height:        Intake/Output Summary (Last 24 hours) at 12/30/2019 0658 Last data filed at 12/29/2019 2230 Gross per 24 hour  Intake 380 ml  Output --  Net 380 ml   Filed Weights   12/27/19 0345 12/28/19 0400 12/29/19 0325  Weight: 87.4 kg 88.2 kg 89 kg    General:  Pleasantly resting in bed, No acute distress.  Alert to person only, continues to hallucinate confabulate overnight/this morning HEENT:  Normocephalic atraumatic.  Sclerae nonicteric, noninjected.  Extraocular movements intact bilaterally. Neck: Tracheostomy capped, brown secretions dried around edges without purulence or erythema Lungs: Coarse breath sounds bilaterally, without overt rhonchi wheeze or rales heart:  Regular rate and rhythm.  Without murmurs, rubs, or gallops. Abdomen:  Soft, nontender, nondistended.  Without guarding or rebound.  PEG tube site without erythema or purulence Extremities: Without cyanosis, clubbing, edema, or obvious deformity. Neuro: Moving all 4 extremities without difficulty, speech much more coherent than previous, sentence length markedly improved now that trach has been capped although still somewhat incoherent discussion, patient is alert to person only. Vascular:  Dorsalis pedis and posterior tibial pulses palpable  bilaterally.  Data Reviewed: I have personally reviewed following  labs and imaging studies  CBC: Recent Labs  Lab 12/24/19 0452 12/27/19 0648  WBC 10.4 10.9*  HGB 11.0* 11.0*  HCT 35.5* 35.2*  MCV 89.4 88.0  PLT 385 [id number]*   Basic Metabolic Panel: Recent Labs  Lab 12/24/19 0452 12/27/19 0648  NA 136 133*  K 3.7 3.5  CL 88* 87*  CO2 35* 33*  GLUCOSE 126* 266*  BUN 45* 48*  CREATININE 1.12* 1.12*  CALCIUM 9.6 9.6   GFR: Estimated Creatinine Clearance: 58.6 mL/min (A) (by C-G formula based on SCr of 1.12 mg/dL (H)). Liver Function Tests: No results for input(s): AST, ALT, ALKPHOS, BILITOT, PROT, ALBUMIN in the last 168 hours. No results for input(s): LIPASE, AMYLASE in the last 168 hours. No results for input(s): AMMONIA in the last 168 hours. Coagulation Profile: No results for input(s): INR, PROTIME in the last 168 hours. Cardiac Enzymes: No results for input(s): CKTOTAL, CKMB, CKMBINDEX, TROPONINI in the last 168 hours. BNP (last 3 results) No results for input(s): PROBNP in the last 8760 hours. HbA1C: No results for input(s): HGBA1C in the last 72 hours. CBG: Recent Labs  Lab 12/29/19 1200 12/29/19 1559 12/29/19 1945 12/29/19 2338 12/30/19 0537  GLUCAP 260* 173* 106* 204* 266*   Lipid Profile: No results for input(s): CHOL, HDL, LDLCALC, TRIG, CHOLHDL, LDLDIRECT in the last 72 hours. Thyroid Function Tests: No results for input(s): TSH, T4TOTAL, FREET4, T3FREE, THYROIDAB in the last 72 hours. Anemia Panel: No results for input(s): VITAMINB12, FOLATE, FERRITIN, TIBC, IRON, RETICCTPCT in the last 72 hours. Urine analysis:    Component Value Date/Time   COLORURINE YELLOW 11/26/2019 1522   APPEARANCEUR CLEAR 11/26/2019 1522   LABSPEC 1.011 11/26/2019 1522   PHURINE 8.0 11/26/2019 1522   GLUCOSEU NEGATIVE 11/26/2019 1522   HGBUR NEGATIVE 11/26/2019 1522   BILIRUBINUR NEGATIVE 11/26/2019 1522   KETONESUR NEGATIVE 11/26/2019 1522   PROTEINUR 30 (A)  11/26/2019 1522   UROBILINOGEN 1.0 06/01/2015 0104   NITRITE NEGATIVE 11/26/2019 1522   LEUKOCYTESUR NEGATIVE 11/26/2019 1522   Recent Results (from the past 240 hour(s))  Culture, respiratory (non-expectorated)     Status: None   Collection Time: 12/24/19 10:55 AM   Specimen: Tracheal Aspirate; Respiratory  Result Value Ref Range Status   Specimen Description TRACHEAL ASPIRATE  Final   Special Requests NONE  Final   Gram Stain   Final    FEW WBC PRESENT,BOTH PMN AND MONONUCLEAR RARE GRAM POSITIVE COCCI IN PAIRS Performed at [facility], [place] [address] [place], [place] [address]    Culture   Final    MODERATE METHICILLIN RESISTANT STAPHYLOCOCCUS AUREUS   Report Status 12/26/2019 FINAL  Final   Organism ID, Bacteria METHICILLIN RESISTANT STAPHYLOCOCCUS AUREUS  Final      Susceptibility   Methicillin resistant staphylococcus aureus - MIC*    CIPROFLOXACIN >=8 RESISTANT Resistant     ERYTHROMYCIN >=8 RESISTANT Resistant     GENTAMICIN <=0.5 SENSITIVE Sensitive     OXACILLIN >=4 RESISTANT Resistant     TETRACYCLINE <=1 SENSITIVE Sensitive     VANCOMYCIN 1 SENSITIVE Sensitive     TRIMETH/SULFA <=10 SENSITIVE Sensitive     CLINDAMYCIN <=0.25 SENSITIVE Sensitive     RIFAMPIN <=0.5 SENSITIVE Sensitive     Inducible Clindamycin NEGATIVE Sensitive     * MODERATE METHICILLIN RESISTANT STAPHYLOCOCCUS AUREUS      Radiology Studies: No results found.  Scheduled Meds: . amLODipine  10 mg Per Tube Daily  . aspirin  81 mg Per  Tube Daily  . benztropine  0.5 mg Per Tube Daily  . budesonide (PULMICORT) nebulizer solution  0.5 mg Nebulization BID  . busPIRone  15 mg Per Tube TID  . chlorhexidine  15 mL Mouth Rinse BID  . Chlorhexidine Gluconate Cloth  6 each Topical Daily  . cloNIDine  0.2 mg Per Tube TID  . dolutegravir  50 mg Oral Daily  . doxepin  50 mg Per Tube QHS  . emtricitabine-tenofovir AF  1 tablet Per Tube Daily  . enoxaparin (LOVENOX) injection  40 mg Subcutaneous  Q24H  . feeding supplement (JEVITY 1.2 CAL)  1,000 mL Per Tube Q24H  . feeding supplement (PRO-STAT SUGAR FREE 64)  30 mL Per Tube TID  . free water  200 mL Per Tube Q6H  . gabapentin  300 mg Per Tube Q8H  . insulin aspart  0-20 Units Subcutaneous Q4H  . insulin glargine  15 Units Subcutaneous BID  . mouth rinse  15 mL Mouth Rinse q12n4p  . metoprolol tartrate  12.5 mg Per Tube BID  . pantoprazole sodium  40 mg Per Tube Daily  . polyethylene glycol  17 g Oral Daily  . QUEtiapine  200 mg Per Tube BID  . rosuvastatin  20 mg Per Tube q1800  . sennosides  15 mL Per Tube BID  . triamterene-hydrochlorothiazide  1 tablet Per Tube Daily   Continuous Infusions: . sodium chloride Stopped (12/07/19 0216)  . vancomycin 1,000 mg (12/29/19 1027)     LOS: 49 days   Time spent: 25 minutes.  Little Ishikawa, DO [facility] [email or web address] 12/30/2019, 6:58 AM

## 2019-12-30 NOTE — Progress Notes (Signed)
Physical Therapy Treatment Patient Details Name: Susan [name] MRN: [id number] DOB: [birth year] Today's Date: 12/30/2019    History of Present Illness 59 year old female smoker admitted 3/10 with overdose complicated by aspiration PNA with MRSA,  Pneumococcal bacteremia, toxic metabolic encephalopathy and septic shock.  Frequent falls at home,Tobacco abuse, diabetes with painful polyneuropathy, bipolar disease, GERD, HIV, schizophrenia, history of IV drug abuse, seizure disorder. Intubated 3/10-3/29 3/23 MRI brain >>Acute nonhemorrhagic infarcts in the posterior MCA territory bilaterally. Tracheostomy placed 3/29    PT Comments    Pt very animated and excited to see therapy initially.  Unrestrained, transitioned to EOB.  Pt ate apple sauce, high calorie supplement drink and nectar tea.  Pt stood for pericare and pre gait activity x6, sidestepped toward [facility] all with minimal assist and consistent supervision for continued impulsivity.    Follow Up Recommendations  SNF     Equipment Recommendations  Other (comment);None recommended by PT(TBA)    Recommendations for Other Services       Precautions / Restrictions Precautions Precautions: Fall Precaution Comments: bilateral mitts and UE restraints. PEG. Trach collar 28%FiO2 Restrictions Weight Bearing Restrictions: No    Mobility  Bed Mobility Overal bed mobility: Needs Assistance Bed Mobility: Supine to Sit     Supine to sit: Min assist     General bed mobility comments: To ensure balance and safety as pt is impulsive.   Transfers Overall transfer level: Needs assistance Equipment used: Rolling walker (2 wheeled) Transfers: Sit to/from Stand Sit to Stand: Min assist;+2 safety/equipment(x6)         General transfer comment: Assist to power up with RW. Cues for safety.   Ambulation/Gait Ambulation/Gait assistance: Min assist;+2 physical assistance;+2 safety/equipment Gait Distance (Feet): 3 Feet         General  Gait Details: side stepping L in the RW up toward the [facility].  Assist for the RW and stability and cues for keeping focus on task.   Stairs             Wheelchair Mobility    Modified Rankin (Stroke Patients Only) Modified Rankin (Stroke Patients Only) Modified Rankin: Moderately severe disability     Balance Overall balance assessment: Needs assistance Sitting-balance support: Feet supported Sitting balance-Leahy Scale: Fair(to good) Sitting balance - Comments: sat EOB x12-15 min   Standing balance support: Bilateral upper extremity supported;During functional activity Standing balance-Leahy Scale: Poor(Poor to fair) Standing balance comment: stood x 6, work on w/shifting, stepping, but mostly multiple stand trials for pericare.                            Cognition Arousal/Alertness: Awake/alert Behavior During Therapy: Impulsive;Restless Overall Cognitive Status: Impaired/Different from baseline Area of Impairment: Orientation;Attention;Memory;Following commands;Safety/judgement;Awareness;Problem solving                 Orientation Level: Disoriented to;Place;Time;Situation Current Attention Level: Focused Memory: Decreased short-term memory;Decreased recall of precautions Following Commands: Follows one step commands with increased time;Follows one step commands inconsistently Safety/Judgement: Decreased awareness of safety;Decreased awareness of deficits Awareness: Intellectual Problem Solving: Decreased initiation;Difficulty sequencing;Requires verbal cues;Requires tactile cues;Slow processing        Exercises      General Comments        Pertinent Vitals/Pain Pain Assessment: Faces Faces Pain Scale: No hurt    Home Living                      Prior Function  PT Goals (current goals can now be found in the care plan section) Acute Rehab PT Goals Patient Stated Goal: to color PT Goal Formulation: With  patient Time For Goal Achievement: 01/12/20 Potential to Achieve Goals: Fair Progress towards PT goals: Progressing toward goals    Frequency    Min 3X/week      PT Plan      Co-evaluation              AM-PAC PT "6 Clicks" Mobility   Outcome Measure  Help needed turning from your back to your side while in a flat bed without using bedrails?: A Little Help needed moving from lying on your back to sitting on the side of a flat bed without using bedrails?: A Little Help needed moving to and from a bed to a chair (including a wheelchair)?: A Lot Help needed standing up from a chair using your arms (e.g., wheelchair or bedside chair)?: A Little Help needed to walk in hospital room?: A Lot Help needed climbing 3-5 steps with a railing? : Total 6 Click Score: 14    End of Session   Activity Tolerance: Patient tolerated treatment well;Patient limited by fatigue Patient left: in bed;with bed alarm set;with restraints reapplied;with call bell/phone within reach Nurse Communication: Mobility status PT Visit Diagnosis: Unsteadiness on feet (R26.81);Other abnormalities of gait and mobility (R26.89);Repeated falls (R29.6);Muscle weakness (generalized) (M62.81);History of falling (Z91.81);Difficulty in walking, not elsewhere classified (R26.2);Hemiplegia and hemiparesis Hemiplegia - Right/Left: Left Hemiplegia - dominant/non-dominant: Non-dominant Hemiplegia - caused by: Cerebral infarction     Time: [id number] PT Time Calculation (min) (ACUTE ONLY): 34 min  Charges:  $Therapeutic Activity: 8-22 mins $Neuromuscular Re-education: 8-22 mins                     12/30/2019  Ginger Carne., PT Acute Rehabilitation Services [phone]  (pager) [phone]  (office)   Tessie Fass Simya Tercero 12/30/2019, 3:48 PM

## 2019-12-31 ENCOUNTER — Other Ambulatory Visit: Payer: Self-pay | Admitting: Internal Medicine

## 2019-12-31 DIAGNOSIS — J9601 Acute respiratory failure with hypoxia: Secondary | ICD-10-CM | POA: Diagnosis not present

## 2019-12-31 LAB — GLUCOSE, CAPILLARY
Glucose-Capillary: 134 mg/dL — ABNORMAL HIGH (ref 70–99)
Glucose-Capillary: 224 mg/dL — ABNORMAL HIGH (ref 70–99)
Glucose-Capillary: 255 mg/dL — ABNORMAL HIGH (ref 70–99)
Glucose-Capillary: 171 mg/dL — ABNORMAL HIGH (ref 70–99)
Glucose-Capillary: 145 mg/dL — ABNORMAL HIGH (ref 70–99)
Glucose-Capillary: 184 mg/dL — ABNORMAL HIGH (ref 70–99)

## 2019-12-31 NOTE — Progress Notes (Signed)
Pharmacy Antibiotic Note  Susan [name] is a 59 y.o. female admitted on 11/11/2019 with MRSA pneumonia.  Pharmacy has been consulted for Vancomycin dosing.   Tracheal aspirate from 4/22 growing MRSA. Antibiotics were initially held as suspected colonizer. WBC up at 10.9. Patient remains afebrile. MD today wants to treat x 7 days. SCr has been stable at 1.02. Patient is making urine - not well documented.   Plan: Continue vancomycin 1000mg  IV every 24 hours.  Goal AUC 400-550. Expected AUC: 486 SCr used: 1.02 Total of 7 days planned.   Height: 5\' 3"  (160 cm) Weight: 88.4 kg (194 lb 14.2 oz) IBW/kg (Calculated) : 52.4  Temp (24hrs), Avg:98.4 F (36.9 C), Min:97.8 F (36.6 C), Max:99.2 F (37.3 C)  Recent Labs  Lab 12/27/19 0648 12/30/19 1039  WBC 10.9* 10.1  CREATININE 1.12* 1.02*    Estimated Creatinine Clearance: 64.2 mL/min (A) (by C-G formula based on SCr of 1.02 mg/dL (H)).    Allergies  Allergen Reactions  . Lyrica [Pregabalin] Swelling    Has LE swelling    Antimicrobials this admission: Vanc 3/10 >>3/17; 4/26 >> Unasyn 3/10 >> 3/11 Zosyn x1 3/10, 3/11 >>3/12 PCN 3/16 >> 3/23 Cefepime 3/26 >> 4/1   Microbiology results: 3/10 covid / flu - negative 3/10 MRSA PCR - positive 3/10BCx - 1/4 Strep pneumo  3/10UCx - negative 3/10 TA - MRSA 3/12 BCx - negative 3/25 TA - Kleb oxytoca (S Ancef, Cipro, Septra, Unasyn) 3/25 BCx - 2/2 Aerococcus spp 4/22 TA>>MRSA  Thank you for allowing pharmacy to be a part of this patient's care.  Sloan Leiter, PharmD, BCPS, BCCCP Clinical Pharmacist Please refer to [facility] for [place] numbers 12/31/2019 10:51 AM

## 2019-12-31 NOTE — Progress Notes (Signed)
Physical Therapy Treatment Patient Details Name: Susan [name] MRN: [id number] DOB: 05/07/1961 Today's Date: 12/31/2019    History of Present Illness 59 year old female smoker admitted 3/10 with overdose complicated by aspiration PNA with MRSA,  Pneumococcal bacteremia, toxic metabolic encephalopathy and septic shock.  Frequent falls at home,Tobacco abuse, diabetes with painful polyneuropathy, bipolar disease, GERD, HIV, schizophrenia, history of IV drug abuse, seizure disorder. Intubated 3/10-3/29 3/23 MRI brain >>Acute nonhemorrhagic infarcts in the posterior MCA territory bilaterally. Tracheostomy placed 3/29    PT Comments    Pt eager to get OOB this day, very talkative and pleasant with PT. Pt required light assist for mobility tasks, but max multimodal cuing for safety during mobility given pt's impulsivity. Pt ambulated short room distance this day with limited tolerance, and tolerated repeated transfer training for multiple toileting bouts (pt requires assist for pericare). PT to continue to follow acutely, will progress as able.    Follow Up Recommendations  SNF     Equipment Recommendations  Other (comment);None recommended by PT(TBA)    Recommendations for Other Services       Precautions / Restrictions Precautions Precautions: Fall Precaution Comments: bilateral mitts and UE restraints. PEG Restrictions Weight Bearing Restrictions: No    Mobility  Bed Mobility Overal bed mobility: Needs Assistance Bed Mobility: Supine to Sit;Sit to Supine   Sidelying to sit: Min assist   Sit to supine: Min assist   General bed mobility comments: min assist for trunk management, pt with use of bedrails and moves quickly and impulsively.  Transfers Overall transfer level: Needs assistance Equipment used: 1 person hand held assist Transfers: Sit to/from Stand Sit to Stand: Min assist Stand pivot transfers: Min assist       General transfer comment: min assist for stand and  pivot to [facility] for steadying upon standing, safely guiding pt to [facility]. Stand x5, from EOB, recliner, and from [facility]  Ambulation/Gait Ambulation/Gait assistance: Min assist Gait Distance (Feet): 5 Feet Assistive device: 1 person hand held assist Gait Pattern/deviations: Step-through pattern;Decreased stride length;Trunk flexed Gait velocity: decr   General Gait Details: min assist to steady, pt with wide BOS and unsteadiness with verbal cuing for focusing on task at hand and safely reaching recliner.   Stairs             Wheelchair Mobility    Modified Rankin (Stroke Patients Only) Modified Rankin (Stroke Patients Only) Pre-Morbid Rankin Score: No symptoms Modified Rankin: Moderately severe disability     Balance Overall balance assessment: Needs assistance Sitting-balance support: Feet supported Sitting balance-Leahy Scale: Good(to good)     Standing balance support: Bilateral upper extremity supported;During functional activity Standing balance-Leahy Scale: Poor(Poor to fair) Standing balance comment: very unsteady, requires PT assist to guide and steady during standing. Pt with standing tolerance up to 1 minute, requires assist for pericare after toileting x2.                            Cognition Arousal/Alertness: Awake/alert Behavior During Therapy: Impulsive;Restless Overall Cognitive Status: Impaired/Different from baseline Area of Impairment: Orientation;Attention;Memory;Following commands;Safety/judgement;Awareness;Problem solving                 Orientation Level: Disoriented to;Place;Time;Situation Current Attention Level: Focused Memory: Decreased short-term memory;Decreased recall of precautions Following Commands: Follows one step commands with increased time;Follows one step commands inconsistently Safety/Judgement: Decreased awareness of safety;Decreased awareness of deficits Awareness: Intellectual Problem Solving: Decreased  initiation;Difficulty sequencing;Requires verbal cues;Requires tactile cues;Slow processing  General Comments: telesitter present. Pt states "before I go, well, let's talk a while" and when PT asked pt where she was going she states "nowhere" and looks bewildered. Pt has very focused attention span, follows commands best when given in short phrases with direct eye contact      Exercises General Exercises - Lower Extremity Long Arc Quad: AROM;Both;10 reps;Seated    General Comments        Pertinent Vitals/Pain Pain Assessment: Faces Faces Pain Scale: Hurts a little bit Pain Location: abdomen, due to bowel movements Pain Descriptors / Indicators: Discomfort Pain Intervention(s): Limited activity within patient's tolerance;Monitored during session    Home Living                      Prior Function            PT Goals (current goals can now be found in the care plan section) Acute Rehab PT Goals Patient Stated Goal: to color PT Goal Formulation: With patient Time For Goal Achievement: 01/12/20 Potential to Achieve Goals: Fair Progress towards PT goals: Progressing toward goals    Frequency    Min 3X/week      PT Plan Current plan remains appropriate    Co-evaluation              AM-PAC PT "6 Clicks" Mobility   Outcome Measure  Help needed turning from your back to your side while in a flat bed without using bedrails?: A Little Help needed moving from lying on your back to sitting on the side of a flat bed without using bedrails?: A Little Help needed moving to and from a bed to a chair (including a wheelchair)?: A Lot Help needed standing up from a chair using your arms (e.g., wheelchair or bedside chair)?: A Little Help needed to walk in hospital room?: A Little Help needed climbing 3-5 steps with a railing? : Total 6 Click Score: 15    End of Session Equipment Utilized During Treatment: Gait belt Activity Tolerance: Patient tolerated treatment  well;Patient limited by fatigue Patient left: in bed;with bed alarm set;with restraints reapplied;with call bell/phone within reach Nurse Communication: Mobility status PT Visit Diagnosis: Unsteadiness on feet (R26.81);Other abnormalities of gait and mobility (R26.89);Repeated falls (R29.6);Muscle weakness (generalized) (M62.81);History of falling (Z91.81);Difficulty in walking, not elsewhere classified (R26.2);Hemiplegia and hemiparesis Hemiplegia - Right/Left: Left Hemiplegia - dominant/non-dominant: Non-dominant Hemiplegia - caused by: Cerebral infarction     Time: [id number] PT Time Calculation (min) (ACUTE ONLY): 43 min  Charges:  $Therapeutic Activity: 23-37 mins $Self Care/Home Management: 8-22                     Momodou Consiglio E, PT Acute Rehabilitation Services Pager [phone]  Office [phone]    Richanda Darin D Gianfranco Araki 12/31/2019, 5:25 PM

## 2019-12-31 NOTE — Plan of Care (Signed)
  Problem: Health Behavior/Discharge Planning: Goal: Ability to manage health-related needs will improve Outcome: Progressing   Problem: Education: Goal: Knowledge of General Education information will improve Description: Including pain rating scale, medication(s)/side effects and non-pharmacologic comfort measures Outcome: Progressing   Problem: Clinical Measurements: Goal: Ability to maintain clinical measurements within normal limits will improve Outcome: Progressing Goal: Will remain free from infection Outcome: Progressing Goal: Diagnostic test results will improve Outcome: Progressing   Problem: Activity: Goal: Risk for activity intolerance will decrease Outcome: Progressing

## 2019-12-31 NOTE — Progress Notes (Signed)
PROGRESS NOTE  Susan [name]  [id number] DOB: Sep 29, 1960 DOA: 11/11/2019 PCP: Hoyt Koch, MD   Brief Narrative: Susan [name] is a 59 year old female with a history of HIV, diabetes with neuropathy, seizure disorder, tobacco use, bipolar disease, schizophrenia, history of IV drug abuse admitted 3/10 to ICU with overdose in shock with aspiration pneumonia with MRSA, pneumococcal bacteremia, toxic metabolic encephalopathy, septic shock and acute versus subacute bilateral MCA infarcts. Patient was intubated on 3/10, difficult to wean off, tracheostomy placed on 3/29. Was treated for MRSA pneumonia and massive volume overload with lasix infusion. Decreased movement and mentation was noted and head CT on 3/22 showed bilateral acute vs. subacute MCA infarcts confirmed on MRI 3/23. [facility] assumed care on 12/06/2019. Disposition is complicated by persistent encephalopathy requiring restraints to protect life-sustaining medical devices including trach and PEG. Psychiatry has been consulted for assistance with medication management.  Patient successfully decannulated on 12/30/2019 now on room air, pending ability to remain without need for restraints will likely dictate patient's disposition options.  Assessment & Plan: Principal Problem:   Acute respiratory failure with hypoxemia (HCC) Active Problems:   HIV disease (HCC)   Schizoaffective disorder, bipolar type ([place])   Drug-induced hypotension   DNR (do not resuscitate) discussion   AKI (acute kidney injury) ([place])   Pneumococcal bacteremia   Pneumonia   Aortic valve endocarditis   Acute kidney injury (AKI) with acute tubular necrosis (ATN) (HCC)   Acute metabolic encephalopathy   Cerebral embolism with cerebral infarction   Status post tracheostomy ([place])   Palliative care by specialist   Adult failure to thrive   Agitation   Acute metabolic encephalopathy/hospital delirium: Large CVAs causing brain injury and complicated  by pre-existing schizophrenia and bipolar disorder. Likely at new baseline at this point.  - Home medications previously included seroquel, cogentin, zoloft, buspar, doxepin, neurontin. - Patient is still requiring soft restraints as well as PRN haldol at all times due to agitation and difficulty following commands/complying with requests -now that trach has been decannulated will allow 24 to 48 hours of healing per PCCM and subsequently attempt restraint holiday in the next 24 to 48 hours. -Patient continues to have hallucinations/confabulating events intermittently - Psychiatry following 1-2 times per week, Dr. Dwyane Dee now recommending discontinuation of Zyprexa and Zoloft, increase Haldol IV 5 mg every 8 hours as needed  - appreciate insight and recommendations  Acute hypoxic/hypercapnic respiratory failure with ARDS from MRSA pneumonia and aspiration pneumonia with difficulty extubating, history of asthma unlikely acute exacerbation, resolving:  - s/p ETT 3/10, tracheostomy 3/29 -decannulated 4/28 - Appreciate PCCM insight recommendations and assistance with this difficult case - Scheduled and prn breathing treatments -Chest x-ray taken previously shows possible right-sided infiltrate, however patient remains without fever with borderline leukocytosis likely aspiration given patient's mental status and dysphagia as below status post PEG tube -vancomycin per ICU team x7 days - stop date 01/04/20  Dysphagia s/p PEG tube placement:  -Speech therapy following, advance diet per recommendations, tolerating PEG tube feedings and medication administration without issue. -Advancing diet per speech recommendations, continue to follow clinically  Aerococcus bacteremia 3/25 Klebsiella bronchitis/pneumonia 3/25 -Completed cefepime on 4/1  Acute large bilateral posterior MCA infarcts 3/23: TEE 3/16 without CES. MRI brain c/w b/l posterior MCA territory infarcts; MRA/ MRV neg - Continue ASA, statin.  - Needs  continued PT/OT/SLP -disposition pending likely SNF versus LTAC given needs.  Acute systolic heart failure with sepsis induced CM, HTN: Following, no longer requiring regular diuresis.  -  Currently on clonidine, amlodipine, triamterene/HCTZ, metoprolol  HLD:  - Continue crestor  Uncontrolled T2DM: HbA1c 12.3%.  - Increase lantus to 15 units twice daily, continue sliding scale insulin - Continue hypoglycemic protocol  Hyponatremia: Resolved. - Continue to titrate free water as necessary, monitor BMP as needed  Hypokalemia:  - Resolved, continue to supplement as necessary  HIV: CD4 440, VL 20 in March 2021. - Continue dolutegravir, emtricitabine/tenofovir  At risk malnutrition: S/p PEG placement on 4/5, by IR - Continue with tube feeds as tolerated.  Obesity: Body mass index is 34.52 kg/m.  - Lifestyle modification advised once able to participate in conversation; currently on tube feeds, will discuss calorie goals with dietary  Goals of care very poor prognosis due to multiple comorbidities - Palliative consulted and had been following  DVT prophylaxis: SCD's Code Status: Full Family Communication: Daughter updated by phone previously Status:  Remains inpatient appropriate because:Unsafe d/c plan as pt still requires restraints for her own safety Dispo: The patient is from: Home  Anticipated d/c is to: SNF vs LTAC  Anticipated d/c date is: TBD - pending clinical/mood improvement, bed availability, and insurance approval  Patient currently is not medically stable to d/c as she continues to require restraints to maintain her own safety given previous trach and PEG placement - will continue to trial out of restraints as psych titrates her medications and now the trach has been removed - hopeful she will be less combative and more compliant out of restraints  Consultants:   PCCM  ID  Neurology  Procedures:   Mechanical  ventilation  TEE  Trach changed to cuffless 4/12  Subjective: No acute issues or events overnight, patient's trach has not been decannulated able to communicate more freely, continues to be somewhat fixated on "going home" had a bowel movement in the bed this morning which she is overly upset about and crying but we discussed that now that the trach is out she should be able to tolerate diet more appropriately and will likely be able to take a break from her wrist restraints in the next 24 hours.  Review of systems is markedly limited due to patient's mental status.  Objective: Vitals:   12/30/19 2039 12/30/19 2319 12/31/19 0318 12/31/19 0500  BP:  (!) 107/50    Pulse:  71 78   Resp:  (!) 21    Temp: 98.4 F (36.9 C) 98 F (36.7 C) 97.8 F (36.6 C)   TempSrc: Oral Oral    SpO2:  96% 100%   Weight:    88.4 kg  Height:        Intake/Output Summary (Last 24 hours) at 12/31/2019 0707 Last data filed at 12/30/2019 1529 Gross per 24 hour  Intake --  Output 800 ml  Net -800 ml   Filed Weights   12/28/19 0400 12/29/19 0325 12/31/19 0500  Weight: 88.2 kg 89 kg 88.4 kg    General:  Pleasantly resting in bed, No acute distress.  Alert to person only HEENT:  Normocephalic atraumatic.  Sclerae nonicteric, noninjected.  Extraocular movements intact bilaterally. Neck: Tracheostomy closed, bandage clean dry intact Lungs: Coarse breath sounds bilaterally, without overt rhonchi wheeze or rales heart:  Regular rate and rhythm.  Without murmurs, rubs, or gallops. Abdomen:  Soft, nontender, nondistended.  Without guarding or rebound.  PEG tube site without erythema or purulence Extremities: Without cyanosis, clubbing, edema, or obvious deformity. Neuro: Moving all 4 extremities without difficulty, speech much more coherent than previous, sentence length markedly  improved now that trach has been capped although still somewhat incoherent discussion, patient is alert to person only. Vascular:   Dorsalis pedis and posterior tibial pulses palpable bilaterally.  Data Reviewed: I have personally reviewed following labs and imaging studies  CBC: Recent Labs  Lab 12/27/19 0648 12/30/19 1039  WBC 10.9* 10.1  HGB 11.0* 11.4*  HCT 35.2* 35.7*  MCV 88.0 86.0  PLT 522* [id number]*   Basic Metabolic Panel: Recent Labs  Lab 12/27/19 0648 12/30/19 1039  NA 133* 137  K 3.5 3.4*  CL 87* 93*  CO2 33* 32  GLUCOSE 266* 165*  BUN 48* 42*  CREATININE 1.12* 1.02*  CALCIUM 9.6 9.6   GFR: Estimated Creatinine Clearance: 64.2 mL/min (A) (by C-G formula based on SCr of 1.02 mg/dL (H)). Liver Function Tests: No results for input(s): AST, ALT, ALKPHOS, BILITOT, PROT, ALBUMIN in the last 168 hours. No results for input(s): LIPASE, AMYLASE in the last 168 hours. No results for input(s): AMMONIA in the last 168 hours. Coagulation Profile: No results for input(s): INR, PROTIME in the last 168 hours. Cardiac Enzymes: No results for input(s): CKTOTAL, CKMB, CKMBINDEX, TROPONINI in the last 168 hours. BNP (last 3 results) No results for input(s): PROBNP in the last 8760 hours. HbA1C: No results for input(s): HGBA1C in the last 72 hours. CBG: Recent Labs  Lab 12/30/19 1223 12/30/19 1756 12/30/19 1922 12/30/19 2328 12/31/19 0319  GLUCAP 96 264* 243* 94 134*   Lipid Profile: No results for input(s): CHOL, HDL, LDLCALC, TRIG, CHOLHDL, LDLDIRECT in the last 72 hours. Thyroid Function Tests: No results for input(s): TSH, T4TOTAL, FREET4, T3FREE, THYROIDAB in the last 72 hours. Anemia Panel: No results for input(s): VITAMINB12, FOLATE, FERRITIN, TIBC, IRON, RETICCTPCT in the last 72 hours. Urine analysis:    Component Value Date/Time   COLORURINE YELLOW 11/26/2019 1522   APPEARANCEUR CLEAR 11/26/2019 1522   LABSPEC 1.011 11/26/2019 1522   PHURINE 8.0 11/26/2019 1522   GLUCOSEU NEGATIVE 11/26/2019 1522   HGBUR NEGATIVE 11/26/2019 1522   BILIRUBINUR NEGATIVE 11/26/2019 1522   KETONESUR  NEGATIVE 11/26/2019 1522   PROTEINUR 30 (A) 11/26/2019 1522   UROBILINOGEN 1.0 06/01/2015 0104   NITRITE NEGATIVE 11/26/2019 1522   LEUKOCYTESUR NEGATIVE 11/26/2019 1522   Recent Results (from the past 240 hour(s))  Culture, respiratory (non-expectorated)     Status: None   Collection Time: 12/24/19 10:55 AM   Specimen: Tracheal Aspirate; Respiratory  Result Value Ref Range Status   Specimen Description TRACHEAL ASPIRATE  Final   Special Requests NONE  Final   Gram Stain   Final    FEW WBC PRESENT,BOTH PMN AND MONONUCLEAR RARE GRAM POSITIVE COCCI IN PAIRS Performed at [facility], [place] [address], [place], [place] [address]    Culture   Final    MODERATE METHICILLIN RESISTANT STAPHYLOCOCCUS AUREUS   Report Status 12/26/2019 FINAL  Final   Organism ID, Bacteria METHICILLIN RESISTANT STAPHYLOCOCCUS AUREUS  Final      Susceptibility   Methicillin resistant staphylococcus aureus - MIC*    CIPROFLOXACIN >=8 RESISTANT Resistant     ERYTHROMYCIN >=8 RESISTANT Resistant     GENTAMICIN <=0.5 SENSITIVE Sensitive     OXACILLIN >=4 RESISTANT Resistant     TETRACYCLINE <=1 SENSITIVE Sensitive     VANCOMYCIN 1 SENSITIVE Sensitive     TRIMETH/SULFA <=10 SENSITIVE Sensitive     CLINDAMYCIN <=0.25 SENSITIVE Sensitive     RIFAMPIN <=0.5 SENSITIVE Sensitive     Inducible Clindamycin NEGATIVE Sensitive     *  MODERATE METHICILLIN RESISTANT STAPHYLOCOCCUS AUREUS      Radiology Studies: No results found.  Scheduled Meds: . amLODipine  10 mg Per Tube Daily  . aspirin  81 mg Per Tube Daily  . benztropine  0.5 mg Per Tube Daily  . budesonide (PULMICORT) nebulizer solution  0.5 mg Nebulization BID  . busPIRone  15 mg Per Tube TID  . chlorhexidine  15 mL Mouth Rinse BID  . Chlorhexidine Gluconate Cloth  6 each Topical Daily  . cloNIDine  0.2 mg Per Tube TID  . dolutegravir  50 mg Oral Daily  . doxepin  50 mg Per Tube QHS  . emtricitabine-tenofovir AF  1 tablet Per Tube Daily  .  enoxaparin (LOVENOX) injection  40 mg Subcutaneous Q24H  . feeding supplement (JEVITY 1.2 CAL)  1,000 mL Per Tube Q24H  . feeding supplement (PRO-STAT SUGAR FREE 64)  30 mL Per Tube TID  . free water  200 mL Per Tube Q6H  . gabapentin  300 mg Per Tube Q8H  . insulin aspart  0-20 Units Subcutaneous Q4H  . insulin glargine  15 Units Subcutaneous BID  . mouth rinse  15 mL Mouth Rinse q12n4p  . metoprolol tartrate  12.5 mg Per Tube BID  . pantoprazole sodium  40 mg Per Tube Daily  . polyethylene glycol  17 g Oral Daily  . QUEtiapine  200 mg Per Tube BID  . rosuvastatin  20 mg Per Tube q1800  . sennosides  15 mL Per Tube BID  . triamterene-hydrochlorothiazide  1 tablet Per Tube Daily   Continuous Infusions: . sodium chloride Stopped (12/07/19 0216)  . vancomycin 1,000 mg (12/30/19 1002)     LOS: 50 days   Time spent: 25 minutes.  Little Ishikawa, DO [facility] [email or web address] 12/31/2019, 7:07 AM

## 2020-01-01 LAB — GLUCOSE, CAPILLARY
Glucose-Capillary: 190 mg/dL — ABNORMAL HIGH (ref 70–99)
Glucose-Capillary: 192 mg/dL — ABNORMAL HIGH (ref 70–99)
Glucose-Capillary: 159 mg/dL — ABNORMAL HIGH (ref 70–99)
Glucose-Capillary: 257 mg/dL — ABNORMAL HIGH (ref 70–99)
Glucose-Capillary: 199 mg/dL — ABNORMAL HIGH (ref 70–99)
Glucose-Capillary: 172 mg/dL — ABNORMAL HIGH (ref 70–99)

## 2020-01-01 MED ORDER — ONDANSETRON HCL 4 MG/2ML IJ SOLN
4.0000 mg | Freq: Four times a day (QID) | INTRAMUSCULAR | Status: DC | PRN
  Administered 2020-01-01: 4 mg via INTRAVENOUS
  Filled 2020-01-01: qty 2

## 2020-01-01 NOTE — Progress Notes (Signed)
PROGRESS NOTE  Susan [name]  [id number] DOB: Sep 01, 1961 DOA: 11/11/2019 PCP: Hoyt Koch, MD   Brief Narrative: Susan [name] is a 59 year old female with a history of HIV, diabetes with neuropathy, seizure disorder, tobacco use, bipolar disease, schizophrenia, history of IV drug abuse admitted 3/10 to ICU with overdose in shock with aspiration pneumonia with MRSA, pneumococcal bacteremia, toxic metabolic encephalopathy, septic shock and acute versus subacute bilateral MCA infarcts. Patient was intubated on 3/10, difficult to wean off, tracheostomy placed on 3/29. Was treated for MRSA pneumonia and massive volume overload with lasix infusion. Decreased movement and mentation was noted and head CT on 3/22 showed bilateral acute vs. subacute MCA infarcts confirmed on MRI 3/23. [facility] assumed care on 12/06/2019. Disposition is complicated by persistent encephalopathy requiring restraints to protect life-sustaining medical devices including trach and PEG. Psychiatry has been consulted for assistance with medication management.  Patient successfully decannulated on 12/30/2019 now on room air, pending ability to remain without need for restraints will likely dictate patient's disposition options.  Assessment & Plan: Principal Problem:   Acute respiratory failure with hypoxemia (HCC) Active Problems:   HIV disease (HCC)   Schizoaffective disorder, bipolar type ([place])   Drug-induced hypotension   DNR (do not resuscitate) discussion   AKI (acute kidney injury) ([place])   Pneumococcal bacteremia   Pneumonia   Aortic valve endocarditis   Acute kidney injury (AKI) with acute tubular necrosis (ATN) (HCC)   Acute metabolic encephalopathy   Cerebral embolism with cerebral infarction   Status post tracheostomy ([place])   Palliative care by specialist   Adult failure to thrive   Agitation   Acute metabolic encephalopathy/hospital delirium: Large CVAs causing brain injury and complicated  by pre-existing schizophrenia and bipolar disorder. Likely at new baseline at this point.  - Home medications previously included seroquel, cogentin, zoloft, buspar, doxepin, neurontin. - Difficulty following commands/complying with requests (no haldol given in the last 24h) - Restraint holiday as tolerated today - Now that trach has been decannulated for 48 hours and healing per PCCM will attempt restraint holiday, will discontinue telemetry and have asked nurse to make sure PEG tube and IVs are well protected and wrapped. - Patient continues confabulate events/discussions during interviews - Psychiatry following 1-2 times per week, Dr. Dwyane Dee now recommending discontinuation of Zyprexa and Zoloft, increase Haldol IV 5 mg every 8 hours as needed  - appreciate insight and recommendations  Acute hypoxic/hypercapnic respiratory failure with ARDS from MRSA pneumonia and aspiration pneumonia with difficulty extubating, history of asthma unlikely acute exacerbation, resolving:  - s/p ETT 3/10, tracheostomy 3/29 -decannulated 4/28 - Appreciate PCCM insight recommendations and assistance with this difficult case - Scheduled and prn breathing treatments - Questionable right-sided infiltrate, sputum MRSA positive continue vancomycin per ICU team x7 days - stop date 01/04/20  Dysphagia s/p PEG tube placement:  -Speech therapy following, advance diet per recommendations, tolerating PEG tube feedings and medication administration without issue. -Advancing diet per speech recommendations, continue to follow clinically  Aerococcus bacteremia 3/25 Klebsiella bronchitis/pneumonia 3/25 MRSA pneumonia suspected 4/28 -Completed cefepime on 4/1 -Vancomycin set to complete 01/04/2020  Acute large bilateral posterior MCA infarcts 3/23: TEE 3/16 without CES. MRI brain c/w b/l posterior MCA territory infarcts; MRA/ MRV neg - Continue ASA, statin.  - Needs continued PT/OT/SLP -disposition pending likely SNF versus LTAC  given needs.  Acute systolic heart failure with sepsis induced CM, HTN: Following, no longer requiring regular diuresis.  - Currently on clonidine, amlodipine, triamterene/HCTZ, metoprolol  HLD:  - Continue crestor  Uncontrolled T2DM: HbA1c 12.3%.  - Increase lantus to 15 units twice daily, continue sliding scale insulin - Continue hypoglycemic protocol  Hyponatremia: Resolved. - Continue to titrate free water as necessary, monitor BMP as needed  Hypokalemia:  - Resolved, continue to supplement as necessary  HIV: CD4 440, VL 20 in March 2021. - Continue dolutegravir, emtricitabine/tenofovir  At risk malnutrition: S/p PEG placement on 4/5, by IR - Continue with tube feeds as tolerated.  Obesity: Body mass index is 34.52 kg/m.  - Lifestyle modification advised once able to participate in conversation; currently on tube feeds, will discuss calorie goals with dietary  Goals of care very poor prognosis due to multiple comorbidities - Palliative consulted and had been following  DVT prophylaxis: SCD's Code Status: Full Family Communication: Daughter updated by phone previously Status:  Remains inpatient appropriate because:Unsafe d/c plan as pt still requires restraints for her own safety Dispo: The patient is from: Home  Anticipated d/c is to: SNF vs LTAC  Anticipated d/c date is: TBD - pending clinical/mood improvement, bed availability, and insurance approval  Patient currently is not medically stable to d/c as she continues to require restraints to maintain her own safety given previous trach and PEG placement - will continue to trial out of restraints as psych titrates her medications and now the trach has been removed - hopeful she will be less combative and more compliant out of restraints  Consultants:   PCCM  ID  Neurology  Procedures:   Mechanical ventilation  TEE  Trach changed to cuffless 4/12  Subjective: No  acute issues or events overnight, patient's trach decannulated 48 hours now, no Haldol given in 24 hours, will attempt restraint holiday today.  Patient seems pleasant, oriented to person only, confabulates much of her history indicating she had multiple discussions with people who were not here overnight  Objective: Vitals:   12/31/19 1912 12/31/19 2026 12/31/19 2300 01/01/20 0335  BP: 116/84  101/64 (!) 126/59  Pulse: 83 82 78 73  Resp: 17 16 17 16   Temp: 98.4 F (36.9 C)  98.2 F (36.8 C) 98 F (36.7 C)  TempSrc: Oral  Oral Oral  SpO2: 98% 98% 98% 98%  Weight:      Height:        Intake/Output Summary (Last 24 hours) at 01/01/2020 0710 Last data filed at 12/31/2019 2300 Gross per 24 hour  Intake 2100 ml  Output 1650 ml  Net 450 ml   Filed Weights   12/28/19 0400 12/29/19 0325 12/31/19 0500  Weight: 88.2 kg 89 kg 88.4 kg    General:  Pleasantly resting in bed, No acute distress.  Alert to person only HEENT:  Normocephalic atraumatic.  Sclerae nonicteric, noninjected.  Extraocular movements intact bilaterally. Neck: Tracheostomy site bandage clean dry intact, trach now removed Lungs: Coarse breath sounds bilaterally, without overt rhonchi wheeze or rales heart:  Regular rate and rhythm.  Without murmurs, rubs, or gallops. Abdomen:  Soft, nontender, nondistended.  Without guarding or rebound.  PEG tube site without erythema or purulence Extremities: Without cyanosis, clubbing, edema, or obvious deformity. Neuro: Moving all 4 extremities without difficulty, speech much more coherent than previous, sentence length markedly improved now that trach has been removed although still somewhat incoherent discussion, patient is alert to person only. Vascular:  Dorsalis pedis and posterior tibial pulses palpable bilaterally.  Data Reviewed: I have personally reviewed following labs and imaging studies  CBC: Recent Labs  Lab 12/27/19 [phone]  12/30/19 1039  WBC 10.9* 10.1  HGB 11.0*  11.4*  HCT 35.2* 35.7*  MCV 88.0 86.0  PLT 522* 123XX123*   Basic Metabolic Panel: Recent Labs  Lab 12/27/19 0648 12/30/19 1039  NA 133* 137  K 3.5 3.4*  CL 87* 93*  CO2 33* 32  GLUCOSE 266* 165*  BUN 48* 42*  CREATININE 1.12* 1.02*  CALCIUM 9.6 9.6   GFR: Estimated Creatinine Clearance: 64.2 mL/min (A) (by C-G formula based on SCr of 1.02 mg/dL (H)). Liver Function Tests: No results for input(s): AST, ALT, ALKPHOS, BILITOT, PROT, ALBUMIN in the last 168 hours. No results for input(s): LIPASE, AMYLASE in the last 168 hours. No results for input(s): AMMONIA in the last 168 hours. Coagulation Profile: No results for input(s): INR, PROTIME in the last 168 hours. Cardiac Enzymes: No results for input(s): CKTOTAL, CKMB, CKMBINDEX, TROPONINI in the last 168 hours. BNP (last 3 results) No results for input(s): PROBNP in the last 8760 hours. HbA1C: No results for input(s): HGBA1C in the last 72 hours. CBG: Recent Labs  Lab 12/31/19 1209 12/31/19 1652 12/31/19 1935 12/31/19 2309 01/01/20 0331  GLUCAP 255* 171* 145* 184* 190*   Lipid Profile: No results for input(s): CHOL, HDL, LDLCALC, TRIG, CHOLHDL, LDLDIRECT in the last 72 hours. Thyroid Function Tests: No results for input(s): TSH, T4TOTAL, FREET4, T3FREE, THYROIDAB in the last 72 hours. Anemia Panel: No results for input(s): VITAMINB12, FOLATE, FERRITIN, TIBC, IRON, RETICCTPCT in the last 72 hours. Urine analysis:    Component Value Date/Time   COLORURINE YELLOW 11/26/2019 1522   APPEARANCEUR CLEAR 11/26/2019 1522   LABSPEC 1.011 11/26/2019 1522   PHURINE 8.0 11/26/2019 1522   GLUCOSEU NEGATIVE 11/26/2019 1522   HGBUR NEGATIVE 11/26/2019 1522   BILIRUBINUR NEGATIVE 11/26/2019 1522   KETONESUR NEGATIVE 11/26/2019 1522   PROTEINUR 30 (A) 11/26/2019 1522   UROBILINOGEN 1.0 06/01/2015 0104   NITRITE NEGATIVE 11/26/2019 1522   LEUKOCYTESUR NEGATIVE 11/26/2019 1522   Recent Results (from the past 240 hour(s))    Culture, respiratory (non-expectorated)     Status: None   Collection Time: 12/24/19 10:55 AM   Specimen: Tracheal Aspirate; Respiratory  Result Value Ref Range Status   Specimen Description TRACHEAL ASPIRATE  Final   Special Requests NONE  Final   Gram Stain   Final    FEW WBC PRESENT,BOTH PMN AND MONONUCLEAR RARE GRAM POSITIVE COCCI IN PAIRS Performed at [facility], [place] [address], [place], [place] [address]    Culture   Final    MODERATE METHICILLIN RESISTANT STAPHYLOCOCCUS AUREUS   Report Status 12/26/2019 FINAL  Final   Organism ID, Bacteria METHICILLIN RESISTANT STAPHYLOCOCCUS AUREUS  Final      Susceptibility   Methicillin resistant staphylococcus aureus - MIC*    CIPROFLOXACIN >=8 RESISTANT Resistant     ERYTHROMYCIN >=8 RESISTANT Resistant     GENTAMICIN <=0.5 SENSITIVE Sensitive     OXACILLIN >=4 RESISTANT Resistant     TETRACYCLINE <=1 SENSITIVE Sensitive     VANCOMYCIN 1 SENSITIVE Sensitive     TRIMETH/SULFA <=10 SENSITIVE Sensitive     CLINDAMYCIN <=0.25 SENSITIVE Sensitive     RIFAMPIN <=0.5 SENSITIVE Sensitive     Inducible Clindamycin NEGATIVE Sensitive     * MODERATE METHICILLIN RESISTANT STAPHYLOCOCCUS AUREUS      Radiology Studies: No results found.  Scheduled Meds: . amLODipine  10 mg Per Tube Daily  . aspirin  81 mg Per Tube Daily  . benztropine  0.5 mg Per Tube  Daily  . budesonide (PULMICORT) nebulizer solution  0.5 mg Nebulization BID  . busPIRone  15 mg Per Tube TID  . chlorhexidine  15 mL Mouth Rinse BID  . Chlorhexidine Gluconate Cloth  6 each Topical Daily  . cloNIDine  0.2 mg Per Tube TID  . dolutegravir  50 mg Oral Daily  . doxepin  50 mg Per Tube QHS  . emtricitabine-tenofovir AF  1 tablet Per Tube Daily  . enoxaparin (LOVENOX) injection  40 mg Subcutaneous Q24H  . feeding supplement (JEVITY 1.2 CAL)  1,000 mL Per Tube Q24H  . feeding supplement (PRO-STAT SUGAR FREE 64)  30 mL Per Tube TID  . free water  200 mL Per Tube Q6H  .  gabapentin  300 mg Per Tube Q8H  . insulin aspart  0-20 Units Subcutaneous Q4H  . insulin glargine  15 Units Subcutaneous BID  . mouth rinse  15 mL Mouth Rinse q12n4p  . metoprolol tartrate  12.5 mg Per Tube BID  . pantoprazole sodium  40 mg Per Tube Daily  . polyethylene glycol  17 g Oral Daily  . QUEtiapine  200 mg Per Tube BID  . rosuvastatin  20 mg Per Tube q1800  . sennosides  15 mL Per Tube BID  . triamterene-hydrochlorothiazide  1 tablet Per Tube Daily   Continuous Infusions: . sodium chloride Stopped (12/07/19 0216)  . vancomycin Stopped (12/31/19 1908)     LOS: 51 days   Time spent: 25 minutes.  Little Ishikawa, DO [facility] [email or web address] 01/01/2020, 7:10 AM

## 2020-01-01 NOTE — Progress Notes (Signed)
Occupational Therapy Treatment Patient Details Name: Susan [name] MRN: [id number] DOB: 1961-04-13 Today's Date: 01/01/2020    History of present illness 59 year old female smoker admitted 3/10 with overdose complicated by aspiration PNA with MRSA,  Pneumococcal bacteremia, toxic metabolic encephalopathy and septic shock.  Frequent falls at home,Tobacco abuse, diabetes with painful polyneuropathy, bipolar disease, GERD, HIV, schizophrenia, history of IV drug abuse, seizure disorder. Intubated 3/10-3/29 3/23 MRI brain >>Acute nonhemorrhagic infarcts in the posterior MCA territory bilaterally. Tracheostomy placed 3/29   OT comments  Pt continues to be impulsive and restless. Performed supine to sit with min guard assist and transferred with min assist. Pt used L UE to locate items in night stand drawers. Appears to have impaired sensation in L hand, frequently grasping and dropping items. Participated in grooming in chair. Returned to bed with mod assist and replaced restraints.   Follow Up Recommendations  SNF;Supervision/Assistance - 24 hour    Equipment Recommendations       Recommendations for Other Services      Precautions / Restrictions Precautions Precautions: Fall Precaution Comments: bilateral mitts and UE restraints. PEG       Mobility Bed Mobility Overal bed mobility: Needs Assistance Bed Mobility: Supine to Sit;Sit to Supine     Supine to sit: Min guard;HOB elevated Sit to supine: Mod assist   General bed mobility comments: assisted LEs back into bed  Transfers Overall transfer level: Needs assistance Equipment used: 1 person hand held assist Transfers: Sit to/from Stand;Stand Pivot Transfers Sit to Stand: Min guard Stand pivot transfers: Min assist       General transfer comment: minimal steadying assist to transfer bed<>chair    Balance Overall balance assessment: Needs assistance   Sitting balance-Leahy Scale: Good Sitting balance - Comments: sat in  chair and dug through drawers of nightstand with L hand     Standing balance-Leahy Scale: Fair Standing balance comment: statically, min guard assist                           ADL either performed or assessed with clinical judgement   ADL Overall ADL's : Needs assistance/impaired Eating/Feeding: (per RN, self fed grits this morning)   Grooming: Sitting;Min guard Grooming Details (indicate cue type and reason): lotion in hair                                     Vision       Perception     Praxis      Cognition Arousal/Alertness: Awake/alert Behavior During Therapy: Impulsive;Restless Overall Cognitive Status: Impaired/Different from baseline Area of Impairment: Orientation;Attention;Memory;Following commands;Safety/judgement;Awareness;Problem solving                 Orientation Level: Disoriented to;Place;Time;Situation Current Attention Level: Focused Memory: Decreased short-term memory;Decreased recall of precautions Following Commands: Follows one step commands with increased time;Follows one step commands inconsistently Safety/Judgement: Decreased awareness of safety;Decreased awareness of deficits Awareness: Intellectual Problem Solving: Decreased initiation;Difficulty sequencing;Requires verbal cues;Requires tactile cues;Slow processing          Exercises     Shoulder Instructions       General Comments      Pertinent Vitals/ Pain       Pain Assessment: Faces Faces Pain Scale: No hurt  Home Living  Prior Functioning/Environment              Frequency  Min 2X/week        Progress Toward Goals  OT Goals(current goals can now be found in the care plan section)  Progress towards OT goals: Progressing toward goals  Acute Rehab OT Goals Patient Stated Goal: to go home OT Goal Formulation: With patient Time For Goal Achievement: 01/05/20 Potential to  Achieve Goals: [place] Discharge plan remains appropriate    Co-evaluation                 AM-PAC OT "6 Clicks" Daily Activity     Outcome Measure   Help from another person eating meals?: A Little Help from another person taking care of personal grooming?: A Lot Help from another person toileting, which includes using toliet, bedpan, or urinal?: Total Help from another person bathing (including washing, rinsing, drying)?: A Lot Help from another person to put on and taking off regular upper body clothing?: A Lot Help from another person to put on and taking off regular lower body clothing?: Total 6 Click Score: 11    End of Session Equipment Utilized During Treatment: Gait belt  OT Visit Diagnosis: Other symptoms and signs involving cognitive function;Muscle weakness (generalized) (M62.81);Unsteadiness on feet (R26.81);Other abnormalities of gait and mobility (R26.89) Hemiplegia - Right/Left: Left Hemiplegia - dominant/non-dominant: Non-Dominant Hemiplegia - caused by: Cerebral infarction   Activity Tolerance Treatment limited secondary to agitation   Patient Left in bed;with call bell/phone within reach;with nursing/sitter in room;with restraints reapplied;with bed alarm set   Nurse Communication          Time: [id number] OT Time Calculation (min): 31 min  Charges: OT General Charges $OT Visit: 1 Visit OT Treatments $Self Care/Home Management : 8-22 mins $Neuromuscular Re-education: 8-22 mins  Nestor Lewandowsky, OTR/L Acute Rehabilitation Services Pager: [phone] Office: [phone]   Malka So 01/01/2020, 10:52 AM

## 2020-01-01 NOTE — Plan of Care (Signed)
  Problem: Education: Goal: Knowledge of General Education information will improve Description: Including pain rating scale, medication(s)/side effects and non-pharmacologic comfort measures Outcome: Progressing   Problem: Health Behavior/Discharge Planning: Goal: Ability to manage health-related needs will improve Outcome: Progressing   Problem: Clinical Measurements: Goal: Ability to maintain clinical measurements within normal limits will improve Outcome: Progressing Goal: Will remain free from infection Outcome: Progressing Goal: Diagnostic test results will improve Outcome: Progressing   Problem: Activity: Goal: Risk for activity intolerance will decrease Outcome: Progressing   Problem: Coping: Goal: Level of anxiety will decrease Outcome: Progressing   Problem: Safety: Goal: Ability to remain free from injury will improve Outcome: Progressing   Problem: Skin Integrity: Goal: Risk for impaired skin integrity will decrease Outcome: Progressing   Problem: Education: Goal: Knowledge of disease or condition will improve Outcome: Progressing Goal: Knowledge of secondary prevention will improve Outcome: Progressing Goal: Knowledge of patient specific risk factors addressed and post discharge goals established will improve Outcome: Progressing

## 2020-01-01 NOTE — Progress Notes (Signed)
Patient has been able to remove one Safety Mitten intermittently during the night, causing the patient to pull at dressings, pull out her Purewick on multiple occassions. Patient was easily redirected and Safety Mittens were replaced each time. Patient was pleasant all night and calm.

## 2020-01-01 NOTE — Progress Notes (Signed)
Patient sat in her recliner with out restraints for several hours today. At at aprox 1345 was helped to the toilet and tele called and said she had several bigemany pvc's.  Patient was moving around a lot so not sure if that rhythm was correct.  She has not had any ectopy since. Patient did vomit after getting her 6pm free water. It was Susan [name] and about 33ml.  MD was contacted for zofran and I gave it to her and she went back to bed.  Patient has been resting in bed since.  She has been calm.

## 2020-01-01 NOTE — Progress Notes (Signed)
Pt did not want to wake up to take medication. RT told nurse to call when pt wakes up to administer later tonight.

## 2020-01-02 LAB — GLUCOSE, CAPILLARY
Glucose-Capillary: 170 mg/dL — ABNORMAL HIGH (ref 70–99)
Glucose-Capillary: 199 mg/dL — ABNORMAL HIGH (ref 70–99)
Glucose-Capillary: 204 mg/dL — ABNORMAL HIGH (ref 70–99)
Glucose-Capillary: 229 mg/dL — ABNORMAL HIGH (ref 70–99)
Glucose-Capillary: 282 mg/dL — ABNORMAL HIGH (ref 70–99)
Glucose-Capillary: 310 mg/dL — ABNORMAL HIGH (ref 70–99)

## 2020-01-02 LAB — CBC
HCT: 34.7 % — ABNORMAL LOW (ref 36.0–46.0)
Hemoglobin: 11.2 g/dL — ABNORMAL LOW (ref 12.0–15.0)
MCH: 27.7 pg (ref 26.0–34.0)
MCHC: 32.3 g/dL (ref 30.0–36.0)
MCV: 85.9 fL (ref 80.0–100.0)
Platelets: 520 10*3/uL — ABNORMAL HIGH (ref 150–400)
RBC: 4.04 MIL/uL (ref 3.87–5.11)
RDW: 16 % — ABNORMAL HIGH (ref 11.5–15.5)
WBC: 10.8 10*3/uL — ABNORMAL HIGH (ref 4.0–10.5)
nRBC: 0 % (ref 0.0–0.2)

## 2020-01-02 LAB — BASIC METABOLIC PANEL
Anion gap: 10 (ref 5–15)
BUN: 39 mg/dL — ABNORMAL HIGH (ref 6–20)
CO2: 34 mmol/L — ABNORMAL HIGH (ref 22–32)
Calcium: 9.6 mg/dL (ref 8.9–10.3)
Chloride: 93 mmol/L — ABNORMAL LOW (ref 98–111)
Creatinine, Ser: 1.07 mg/dL — ABNORMAL HIGH (ref 0.44–1.00)
GFR calc Af Amer: >60 mL/min (ref >60)
GFR calc non Af Amer: 58 mL/min — ABNORMAL LOW (ref >60)
Glucose, Bld: 252 mg/dL — ABNORMAL HIGH (ref 70–99)
Potassium: 3.4 mmol/L — ABNORMAL LOW (ref 3.5–5.1)
Sodium: 137 mmol/L (ref 135–145)

## 2020-01-02 NOTE — Progress Notes (Signed)
PROGRESS NOTE  [name]  [id number] DOB: [birth year] DOA: 11/11/2019 PCP: Hoyt Koch, MD   Brief Narrative: Susan [name] is a 59 year old female with a history of HIV, diabetes with neuropathy, seizure disorder, tobacco use, bipolar disease, schizophrenia, history of IV drug abuse admitted 3/10 to ICU with overdose in shock with aspiration pneumonia with MRSA, pneumococcal bacteremia, toxic metabolic encephalopathy, septic shock and acute versus subacute bilateral MCA infarcts. Patient was intubated on 3/10, difficult to wean off, tracheostomy placed on 3/29. Was treated for MRSA pneumonia and massive volume overload with lasix infusion. Decreased movement and mentation was noted and head CT on 3/22 showed bilateral acute vs. subacute MCA infarcts confirmed on MRI 3/23. [facility] assumed care on 12/06/2019. Disposition is complicated by persistent encephalopathy requiring restraints to protect life-sustaining medical devices including trach and PEG. Psychiatry has been consulted for assistance with medication management.  Patient successfully decannulated on 12/30/2019 now on room air, pending ability to remain without need for restraints will likely dictate patient's disposition options.  Assessment & Plan: Principal Problem:   Acute respiratory failure with hypoxemia (HCC) Active Problems:   HIV disease (HCC)   Schizoaffective disorder, bipolar type ([name])   Drug-induced hypotension   DNR (do not resuscitate) discussion   AKI (acute kidney injury) ([name])   Pneumococcal bacteremia   Pneumonia   Aortic valve endocarditis   Acute kidney injury (AKI) with acute tubular necrosis (ATN) (HCC)   Acute metabolic encephalopathy   Cerebral embolism with cerebral infarction   Status post tracheostomy ([place])   Palliative care by specialist   Adult failure to thrive   Agitation    Acute metabolic encephalopathy/hospital delirium: Large CVAs causing brain injury and  complicated by pre-existing schizophrenia and bipolar disorder. Likely at new baseline at this point.  - Home medications previously included seroquel, cogentin, zoloft, buspar, doxepin, neurontin. - Difficulty following commands/complying with requests (no haldol given in the last 24h) - Restraint holiday as tolerated today - Now that trach has been decannulated for 48 hours and healing per PCCM will continue restraint holiday - now off for nearly 24h. No PRN haldol over the past 24h. - Patient continues confabulate events/discussions during interviews - Psychiatry following 1-2 times per week, Dr. Dwyane Dee now recommending discontinuation of Zyprexa and Zoloft, increase Haldol IV 5 mg every 8 hours as needed  - appreciate insight and recommendations  Acute hypoxic/hypercapnic respiratory failure with ARDS from MRSA pneumonia and aspiration pneumonia with difficulty extubating, history of asthma unlikely acute exacerbation, resolving:  - s/p ETT 3/10, tracheostomy 3/29 -decannulated 4/28 - Appreciate PCCM insight recommendations and assistance with this difficult case - Scheduled and prn breathing treatments - Questionable right-sided infiltrate, sputum MRSA positive continue vancomycin per ICU team x7 days - stop date 01/04/20  Dysphagia s/p PEG tube placement:  -Speech therapy following, advance diet per recommendations, tolerating PEG tube feedings and medication administration without issue. -Advancing diet per speech recommendations, continue to follow clinically  Aerococcus bacteremia 3/25 Klebsiella bronchitis/pneumonia 3/25 MRSA pneumonia suspected 4/28 -Completed cefepime on 4/1 -Vancomycin set to complete 01/04/2020  Acute large bilateral posterior MCA infarcts 3/23: TEE 3/16 without CES. MRI brain c/w b/l posterior MCA territory infarcts; MRA/ MRV neg - Continue ASA, statin.  - Needs continued PT/OT/SLP -disposition pending likely SNF versus LTAC given needs.  Acute systolic heart  failure with sepsis induced CM, HTN: Following, no longer requiring regular diuresis.  - Currently on clonidine, amlodipine, triamterene/HCTZ, metoprolol  HLD:  - Continue crestor  Uncontrolled T2DM: HbA1c 12.3%.  - Increase lantus to 15 units twice daily, continue sliding scale insulin - Continue hypoglycemic protocol  Hyponatremia: Resolved. - Continue to titrate free water as necessary, monitor BMP as needed  Hypokalemia:  - Resolved, continue to supplement as necessary  HIV: CD4 440, VL 20 in March 2021. - Continue dolutegravir, emtricitabine/tenofovir  At risk malnutrition: S/p PEG placement on 4/5, by IR - Continue with tube feeds as tolerated.  Obesity: Body mass index is 34.68 kg/m.  - Lifestyle modification advised once able to participate in conversation; currently on tube feeds, will discuss calorie goals with dietary  Goals of care very poor prognosis due to multiple comorbidities - Palliative consulted and had been following  DVT prophylaxis: SCD's Code Status: Full Family Communication: Daughter updated by phone 5/1 Status:  Remains inpatient appropriate because:Unsafe d/c plan as pt still requires restraints for her own safety Dispo: The patient is from: Home  Anticipated d/c is to: SNF vs Home [facility]  Anticipated d/c date is: TBD - pending clinical/mood improvement, bed availability, and insurance approval  Patient currently is not medically stable to d/c as she continues to require close monitoring to maintain safety given PEG placement - will continue to trial out of restraints as psych titrates her medications and now the trach has been removed - hopeful she will be less combative and more compliant out of restraints.  Consultants:   PCCM  ID  Neurology  Procedures:   Mechanical ventilation  TEE  Trach changed to cuffless 4/12  Subjective: No acute issues or events overnight, patient's trach  decannulated 48 hours now, no Haldol given in 24 hours, will attempt restraint holiday today.  Patient seems pleasant, oriented to person only, confabulates much of her history indicating she had multiple discussions with people who were not here overnight.  Objective: Vitals:   01/02/20 0200 01/02/20 0300 01/02/20 0400 01/02/20 0418  BP:    (!) 114/59  Pulse:  88  90  Resp: 20 20 20 19   Temp:    98.6 F (37 C)  TempSrc:    Oral  SpO2:  94%  98%  Weight:    88.8 kg  Height:        Intake/Output Summary (Last 24 hours) at 01/02/2020 0704 Last data filed at 01/02/2020 0428 Gross per 24 hour  Intake 5581.67 ml  Output 950 ml  Net 4631.67 ml   Filed Weights   12/29/19 0325 12/31/19 0500 01/02/20 0418  Weight: 89 kg 88.4 kg 88.8 kg    General:  Pleasantly resting in bed, No acute distress.  Alert to person only HEENT:  Normocephalic atraumatic.  Sclerae nonicteric, noninjected.  Extraocular movements intact bilaterally. Neck: Lurline Idol now removed Lungs: Coarse breath sounds bilaterally, without overt rhonchi wheeze or rales heart:  Regular rate and rhythm.  Without murmurs, rubs, or gallops. Abdomen:  Soft, nontender, nondistended.  Without guarding or rebound.  PEG tube site without erythema or purulence Extremities: Without cyanosis, clubbing, edema, or obvious deformity - moving all limbs without issues. Neuro: Speech much more coherent than previous, sentence length markedly improved now that trach has been removed although still somewhat incoherent discussion, patient is alert to person/place Vascular:  Dorsalis pedis and posterior tibial pulses palpable bilaterally.  Data Reviewed: I have personally reviewed following labs and imaging studies  CBC: Recent Labs  Lab 12/27/19 0648 12/30/19 1039 01/02/20 0413  WBC 10.9* 10.1 10.8*  HGB 11.0* 11.4* 11.2*  HCT 35.2* 35.7* 34.7*  MCV  88.0 86.0 85.9  PLT 522* 587* 123456*   Basic Metabolic Panel: Recent Labs  Lab 12/27/19 0648  12/30/19 1039 01/02/20 0413  NA 133* 137 137  K 3.5 3.4* 3.4*  CL 87* 93* 93*  CO2 33* 32 34*  GLUCOSE 266* 165* 252*  BUN 48* 42* 39*  CREATININE 1.12* 1.02* 1.07*  CALCIUM 9.6 9.6 9.6   GFR: Estimated Creatinine Clearance: 61.4 mL/min (A) (by C-G formula based on SCr of 1.07 mg/dL (H)).  Liver Function Tests: No results for input(s): AST, ALT, ALKPHOS, BILITOT, PROT, ALBUMIN in the last 168 hours. No results for input(s): LIPASE, AMYLASE in the last 168 hours. No results for input(s): AMMONIA in the last 168 hours.  Coagulation Profile: No results for input(s): INR, PROTIME in the last 168 hours.  Cardiac Enzymes: No results for input(s): CKTOTAL, CKMB, CKMBINDEX, TROPONINI in the last 168 hours.  BNP (last 3 results) No results for input(s): PROBNP in the last 8760 hours.  HbA1C: No results for input(s): HGBA1C in the last 72 hours.  CBG: Recent Labs  Lab 01/01/20 1155 01/01/20 1539 01/01/20 2037 01/01/20 2328 01/02/20 0407  GLUCAP 159* 257* 199* 172* 229*   Lipid Profile: No results for input(s): CHOL, HDL, LDLCALC, TRIG, CHOLHDL, LDLDIRECT in the last 72 hours.  Thyroid Function Tests: No results for input(s): TSH, T4TOTAL, FREET4, T3FREE, THYROIDAB in the last 72 hours.  Anemia Panel: No results for input(s): VITAMINB12, FOLATE, FERRITIN, TIBC, IRON, RETICCTPCT in the last 72 hours.  Urine analysis:    Component Value Date/Time   COLORURINE YELLOW 11/26/2019 1522   APPEARANCEUR CLEAR 11/26/2019 1522   LABSPEC 1.011 11/26/2019 1522   PHURINE 8.0 11/26/2019 1522   GLUCOSEU NEGATIVE 11/26/2019 1522   HGBUR NEGATIVE 11/26/2019 1522   BILIRUBINUR NEGATIVE 11/26/2019 1522   KETONESUR NEGATIVE 11/26/2019 1522   PROTEINUR 30 (A) 11/26/2019 1522   UROBILINOGEN 1.0 06/01/2015 0104   NITRITE NEGATIVE 11/26/2019 1522   LEUKOCYTESUR NEGATIVE 11/26/2019 1522   Recent Results (from the past 240 hour(s))  Culture, respiratory (non-expectorated)     Status:  None   Collection Time: 12/24/19 10:55 AM   Specimen: Tracheal Aspirate; Respiratory  Result Value Ref Range Status   Specimen Description TRACHEAL ASPIRATE  Final   Special Requests NONE  Final   Gram Stain   Final    FEW WBC PRESENT,BOTH PMN AND MONONUCLEAR RARE GRAM POSITIVE COCCI IN PAIRS Performed at [facility], [place] [address], [place], [place] [address]    Culture   Final    MODERATE METHICILLIN RESISTANT STAPHYLOCOCCUS AUREUS   Report Status 12/26/2019 FINAL  Final   Organism ID, Bacteria METHICILLIN RESISTANT STAPHYLOCOCCUS AUREUS  Final      Susceptibility   Methicillin resistant staphylococcus aureus - MIC*    CIPROFLOXACIN >=8 RESISTANT Resistant     ERYTHROMYCIN >=8 RESISTANT Resistant     GENTAMICIN <=0.5 SENSITIVE Sensitive     OXACILLIN >=4 RESISTANT Resistant     TETRACYCLINE <=1 SENSITIVE Sensitive     VANCOMYCIN 1 SENSITIVE Sensitive     TRIMETH/SULFA <=10 SENSITIVE Sensitive     CLINDAMYCIN <=0.25 SENSITIVE Sensitive     RIFAMPIN <=0.5 SENSITIVE Sensitive     Inducible Clindamycin NEGATIVE Sensitive     * MODERATE METHICILLIN RESISTANT STAPHYLOCOCCUS AUREUS      Radiology Studies: No results found.  Scheduled Meds: . amLODipine  10 mg Per Tube Daily  . aspirin  81 mg Per Tube Daily  . benztropine  0.5 mg Per Tube Daily  . budesonide (PULMICORT) nebulizer solution  0.5 mg Nebulization BID  . busPIRone  15 mg Per Tube TID  . chlorhexidine  15 mL Mouth Rinse BID  . Chlorhexidine Gluconate Cloth  6 each Topical Daily  . cloNIDine  0.2 mg Per Tube TID  . dolutegravir  50 mg Oral Daily  . doxepin  50 mg Per Tube QHS  . emtricitabine-tenofovir AF  1 tablet Per Tube Daily  . enoxaparin (LOVENOX) injection  40 mg Subcutaneous Q24H  . feeding supplement (JEVITY 1.2 CAL)  1,000 mL Per Tube Q24H  . feeding supplement (PRO-STAT SUGAR FREE 64)  30 mL Per Tube TID  . free water  200 mL Per Tube Q6H  . gabapentin  300 mg Per Tube Q8H  . insulin aspart   0-20 Units Subcutaneous Q4H  . insulin glargine  15 Units Subcutaneous BID  . mouth rinse  15 mL Mouth Rinse q12n4p  . metoprolol tartrate  12.5 mg Per Tube BID  . pantoprazole sodium  40 mg Per Tube Daily  . polyethylene glycol  17 g Oral Daily  . QUEtiapine  200 mg Per Tube BID  . rosuvastatin  20 mg Per Tube q1800  . sennosides  15 mL Per Tube BID  . triamterene-hydrochlorothiazide  1 tablet Per Tube Daily   Continuous Infusions: . sodium chloride Stopped (12/07/19 0216)  . vancomycin Stopped (01/01/20 1317)     LOS: 52 days   Time spent: 25 minutes.  Little Ishikawa, DO [facility] [email or web address] 01/02/2020, 7:04 AM

## 2020-01-03 LAB — GLUCOSE, CAPILLARY
Glucose-Capillary: 110 mg/dL — ABNORMAL HIGH (ref 70–99)
Glucose-Capillary: 165 mg/dL — ABNORMAL HIGH (ref 70–99)
Glucose-Capillary: 166 mg/dL — ABNORMAL HIGH (ref 70–99)
Glucose-Capillary: 226 mg/dL — ABNORMAL HIGH (ref 70–99)
Glucose-Capillary: 277 mg/dL — ABNORMAL HIGH (ref 70–99)
Glucose-Capillary: 314 mg/dL — ABNORMAL HIGH (ref 70–99)

## 2020-01-03 MED ORDER — POTASSIUM CHLORIDE CRYS ER 20 MEQ PO TBCR
20.0000 meq | EXTENDED_RELEASE_TABLET | Freq: Once | ORAL | Status: DC
  Filled 2020-01-03: qty 1

## 2020-01-03 NOTE — Progress Notes (Signed)
PROGRESS NOTE  Susan [name]  [id number] DOB: 07/15/1961 DOA: 11/11/2019 PCP: Hoyt Koch, MD   Brief Narrative: Susan [name] is a 59 year old female with a history of HIV, diabetes with neuropathy, seizure disorder, tobacco use, bipolar disease, schizophrenia, history of IV drug abuse admitted 3/10 to ICU with overdose in shock with aspiration pneumonia with MRSA, pneumococcal bacteremia, toxic metabolic encephalopathy, septic shock and acute versus subacute bilateral MCA infarcts. Patient was intubated on 3/10, difficult to wean off, tracheostomy placed on 3/29. Was treated for MRSA pneumonia and massive volume overload with lasix infusion. Decreased movement and mentation was noted and head CT on 3/22 showed bilateral acute vs. subacute MCA infarcts confirmed on MRI 3/23. [facility] assumed care on 12/06/2019. Disposition is complicated by persistent encephalopathy requiring restraints to protect life-sustaining medical devices including trach and PEG. Psychiatry has been consulted for assistance with medication management. Tracheostomy decannulated 4/28, weaned to room air.   Assessment & Plan: Principal Problem:   Acute respiratory failure with hypoxemia (HCC) Active Problems:   HIV disease (HCC)   Schizoaffective disorder, bipolar type ([place])   Drug-induced hypotension   DNR (do not resuscitate) discussion   AKI (acute kidney injury) ([place])   Pneumococcal bacteremia   Pneumonia   Aortic valve endocarditis   Acute kidney injury (AKI) with acute tubular necrosis (ATN) (HCC)   Acute metabolic encephalopathy   Cerebral embolism with cerebral infarction   Status post tracheostomy ([place])   Palliative care by specialist   Adult failure to thrive   Agitation  Acute metabolic encephalopathy/hospital delirium: Large CVAs causing brain injury and complicated by preexisting schizophrenia and bipolar disorder.  - Psychiatry following 1-2 times per week, Dr. Dwyane Dee now  recommending discontinuation of Zyprexa and Zoloft, increase Haldol IV 5 mg every 8 hours as needed. - Minimize restraints as much as possible, off since 4/30. Would be stable for DC 5/2 if continues this improvement.  Acute hypoxic/hypercapnic respiratory failure with ARDS from MRSA pneumonia and aspiration pneumonia, history of asthma: s/p ETT 3/10, tracheostomy 3/29 - 4/28. - Weaned to room air.  - Scheduled and prn BDs  Dysphagia s/p PEG:  - Continue SLP evaluations, advance diet per their recommendations.  - Continue tube feeds for now.  Aerococcus bacteremia 3/25 Klebsiella bronchitis/pneumonia 3/25 MRSA pneumonia 4/28. - s/p cefepime x7 days on 4/1 - Continue vancomycin, last dose 5/1 (7 days), recheck WBC in AM.  Acute large bilateral posterior MCA infarcts 3/23: TEE 3/16 without CES. MRI brain c/w b/l posterior MCA territory infarcts; MRA/ MRV neg - Continue ASA, statin.  - Needs continued PT/OT/SLP    Acute systolic heart failure with sepsis induced CM, HTN: No longer requiring regular diuresis. Daily weights showing slow decline. - Currently on clonidine, amlodipine, triamterene/HCTZ, metoprolol  HLD:  - Continue crestor  Poorly-controlled T2DM: HbA1c 12.3%.  - Continue basal-bolus insulin/CBGs. At inpatient goal.  Hyponatremia: Improving. - Decreased free water, monitor BMP  Hypokalemia: Supplemented.  HIV: CD4 440, VL 20 in March 2021. - Continue ART  At risk malnutrition: S/p PEG placement on 4/5, by IR - Continue with tube feeds as tolerated.  Obesity: Body mass index is 34.91 kg/m.  - Lifestyle modification advised  GOC:  - Palliative consulted and had been following  DVT prophylaxis: SCD's Code Status: Full Family Communication: Daughter by phone previously Status is: Inpatient  Remains inpatient appropriate because:Unsafe d/c plan as pt still requires restraints for her own safety  Dispo: The patient is from: Home  Anticipated d/c is to: SNF vs. HH  Anticipated d/c date is: 01/03/2020  Patient currently is not medically stable to d/c.   Consultants:   PCCM  ID  Neurology  Procedures:   Mechanical ventilation  TEE  Trach changed to cuffless 4/12  Subjective: Remains confused but redirectable, no pain or trouble breathing, desperately wants to go home. No new issues, fevers, cough. Has not been in restraints x >24 hrs  Objective: Vitals:   01/03/20 0727 01/03/20 0756 01/03/20 1210 01/03/20 1633  BP:  124/84 114/79 122/65  Pulse:  87 85 91  Resp:  16 20 17   Temp:  97.7 F (36.5 C) 98.2 F (36.8 C) 98.3 F (36.8 C)  TempSrc:  Oral Oral Oral  SpO2: 96% 97% 96% 99%  Weight:      Height:        Intake/Output Summary (Last 24 hours) at 01/03/2020 1716 Last data filed at 01/02/2020 2239 Gross per 24 hour  Intake --  Output 300 ml  Net -300 ml   Filed Weights   12/31/19 0500 01/02/20 0418 01/03/20 0359  Weight: 88.4 kg 88.8 kg 89.4 kg   Gen: 59 y.o. female in no distress Pulm: Nonlabored breathing room air. Clear. CV: Regular rate and rhythm. No murmur, rub, or gallop. No JVD, no dependent edema. GI: Abdomen soft, non-tender, non-distended, with normoactive bowel sounds.  Ext: Warm, no deformities Skin: No new rashes, lesions or ulcers on visualized skin. PEG site c/d/i, nontender Neuro: Alert and disoriented, without new focal neurological deficits. Psych: Judgement and insight appear impaired. Mood euthymic & affect congruent. Behavior is abnormal but not aggressive..    Data Reviewed: I have personally reviewed following labs and imaging studies  CBC: Recent Labs  Lab 12/30/19 1039 01/02/20 0413  WBC 10.1 10.8*  HGB 11.4* 11.2*  HCT 35.7* 34.7*  MCV 86.0 85.9  PLT 587* 123456*   Basic Metabolic Panel: Recent Labs  Lab 12/30/19 1039 01/02/20 0413  NA 137 137  K 3.4* 3.4*  CL 93* 93*  CO2 32 34*  GLUCOSE 165* 252*  BUN 42* 39*   CREATININE 1.02* 1.07*  CALCIUM 9.6 9.6   GFR: Estimated Creatinine Clearance: 61.5 mL/min (A) (by C-G formula based on SCr of 1.07 mg/dL (H)). Liver Function Tests: No results for input(s): AST, ALT, ALKPHOS, BILITOT, PROT, ALBUMIN in the last 168 hours. No results for input(s): LIPASE, AMYLASE in the last 168 hours. No results for input(s): AMMONIA in the last 168 hours. Coagulation Profile: No results for input(s): INR, PROTIME in the last 168 hours. Cardiac Enzymes: No results for input(s): CKTOTAL, CKMB, CKMBINDEX, TROPONINI in the last 168 hours. BNP (last 3 results) No results for input(s): PROBNP in the last 8760 hours. HbA1C: No results for input(s): HGBA1C in the last 72 hours. CBG: Recent Labs  Lab 01/02/20 2341 01/03/20 0352 01/03/20 0802 01/03/20 1209 01/03/20 1629  GLUCAP 204* 166* 226* 314* 165*   Lipid Profile: No results for input(s): CHOL, HDL, LDLCALC, TRIG, CHOLHDL, LDLDIRECT in the last 72 hours. Thyroid Function Tests: No results for input(s): TSH, T4TOTAL, FREET4, T3FREE, THYROIDAB in the last 72 hours. Anemia Panel: No results for input(s): VITAMINB12, FOLATE, FERRITIN, TIBC, IRON, RETICCTPCT in the last 72 hours. Urine analysis:    Component Value Date/Time   COLORURINE YELLOW 11/26/2019 1522   APPEARANCEUR CLEAR 11/26/2019 1522   LABSPEC 1.011 11/26/2019 1522   PHURINE 8.0 11/26/2019 1522   GLUCOSEU NEGATIVE 11/26/2019 1522   HGBUR NEGATIVE 11/26/2019  [place] 11/26/2019 [place] 11/26/2019 1522   PROTEINUR 30 (A) 11/26/2019 1522   UROBILINOGEN 1.0 06/01/2015 0104   NITRITE NEGATIVE 11/26/2019 1522   LEUKOCYTESUR NEGATIVE 11/26/2019 1522   No results found for this or any previous visit (from the past 240 hour(s)).    Radiology Studies: No results found.  Scheduled Meds: . amLODipine  10 mg Per Tube Daily  . aspirin  81 mg Per Tube Daily  . benztropine  0.5 mg Per Tube Daily  . budesonide  (PULMICORT) nebulizer solution  0.5 mg Nebulization BID  . busPIRone  15 mg Per Tube TID  . chlorhexidine  15 mL Mouth Rinse BID  . Chlorhexidine Gluconate Cloth  6 each Topical Daily  . cloNIDine  0.2 mg Per Tube TID  . dolutegravir  50 mg Oral Daily  . doxepin  50 mg Per Tube QHS  . emtricitabine-tenofovir AF  1 tablet Per Tube Daily  . enoxaparin (LOVENOX) injection  40 mg Subcutaneous Q24H  . feeding supplement (JEVITY 1.2 CAL)  1,000 mL Per Tube Q24H  . feeding supplement (PRO-STAT SUGAR FREE 64)  30 mL Per Tube TID  . free water  200 mL Per Tube Q6H  . gabapentin  300 mg Per Tube Q8H  . insulin aspart  0-20 Units Subcutaneous Q4H  . insulin glargine  15 Units Subcutaneous BID  . mouth rinse  15 mL Mouth Rinse q12n4p  . metoprolol tartrate  12.5 mg Per Tube BID  . pantoprazole sodium  40 mg Per Tube Daily  . polyethylene glycol  17 g Oral Daily  . potassium chloride  20 mEq Oral Once  . QUEtiapine  200 mg Per Tube BID  . rosuvastatin  20 mg Per Tube q1800  . sennosides  15 mL Per Tube BID  . triamterene-hydrochlorothiazide  1 tablet Per Tube Daily   Continuous Infusions: . sodium chloride Stopped (12/07/19 0216)     LOS: 53 days   Time spent: 25 minutes.  Patrecia Pour, MD [facility] [email or web address] 01/03/2020, 5:16 PM

## 2020-01-04 LAB — BASIC METABOLIC PANEL WITH GFR
Anion gap: 11 (ref 5–15)
BUN: 40 mg/dL — ABNORMAL HIGH (ref 6–20)
CO2: 30 mmol/L (ref 22–32)
Calcium: 9.5 mg/dL (ref 8.9–10.3)
Chloride: 97 mmol/L — ABNORMAL LOW (ref 98–111)
Creatinine, Ser: 0.98 mg/dL (ref 0.44–1.00)
GFR calc Af Amer: >60 mL/min
GFR calc non Af Amer: >60 mL/min
Glucose, Bld: 231 mg/dL — ABNORMAL HIGH (ref 70–99)
Potassium: 3.3 mmol/L — ABNORMAL LOW (ref 3.5–5.1)
Sodium: 138 mmol/L (ref 135–145)

## 2020-01-04 LAB — GLUCOSE, CAPILLARY
Glucose-Capillary: 223 mg/dL — ABNORMAL HIGH (ref 70–99)
Glucose-Capillary: 144 mg/dL — ABNORMAL HIGH (ref 70–99)
Glucose-Capillary: 267 mg/dL — ABNORMAL HIGH (ref 70–99)
Glucose-Capillary: 284 mg/dL — ABNORMAL HIGH (ref 70–99)
Glucose-Capillary: 164 mg/dL — ABNORMAL HIGH (ref 70–99)
Glucose-Capillary: 148 mg/dL — ABNORMAL HIGH (ref 70–99)
Glucose-Capillary: 222 mg/dL — ABNORMAL HIGH (ref 70–99)

## 2020-01-04 LAB — SARS CORONAVIRUS 2 (TAT 6-24 HRS): SARS Coronavirus 2: NEGATIVE

## 2020-01-04 MED ORDER — ONDANSETRON 4 MG PO TBDP: 4.0000 mg | ORAL_TABLET | Freq: Three times a day (TID) | ORAL | Status: DC | PRN

## 2020-01-04 MED ORDER — HALOPERIDOL LACTATE 5 MG/ML IJ SOLN: 5.0000 mg | Freq: Three times a day (TID) | INTRAMUSCULAR | Status: DC | PRN

## 2020-01-04 NOTE — Progress Notes (Signed)
PROGRESS NOTE  Susan [name]  [id number] DOB: March 02, 1961 DOA: 11/11/2019 PCP: Hoyt Koch, MD   Brief Narrative: Susan [name] is a 59 year old female with a history of HIV, diabetes with neuropathy, seizure disorder, tobacco use, bipolar disease, schizophrenia, history of IV drug abuse admitted 3/10 to ICU with overdose in shock with aspiration pneumonia with MRSA, pneumococcal bacteremia, toxic metabolic encephalopathy, septic shock and acute versus subacute bilateral MCA infarcts. Patient was intubated on 3/10, difficult to wean off, tracheostomy placed on 3/29. Was treated for MRSA pneumonia and massive volume overload with lasix infusion. Decreased movement and mentation was noted and head CT on 3/22 showed bilateral acute vs. subacute MCA infarcts confirmed on MRI 3/23. [facility] assumed care on 12/06/2019. Disposition is complicated by persistent encephalopathy requiring restraints to protect life-sustaining medical devices including trach and PEG. Psychiatry has been consulted for assistance with medication management. Tracheostomy decannulated 4/28, weaned to room air.   Assessment & Plan: Principal Problem:   Acute respiratory failure with hypoxemia (HCC) Active Problems:   HIV disease (HCC)   Schizoaffective disorder, bipolar type ([place])   Drug-induced hypotension   DNR (do not resuscitate) discussion   AKI (acute kidney injury) ([place])   Pneumococcal bacteremia   Pneumonia   Aortic valve endocarditis   Acute kidney injury (AKI) with acute tubular necrosis (ATN) (HCC)   Acute metabolic encephalopathy   Cerebral embolism with cerebral infarction   Status post tracheostomy ([place])   Palliative care by specialist   Adult failure to thrive   Agitation  Acute metabolic encephalopathy/hospital delirium: Large CVAs causing brain injury and complicated by preexisting schizophrenia and bipolar disorder.  - Psychiatry following 1-2 times per week, Dr. Dwyane Dee now  recommending discontinuation of Zyprexa and Zoloft, increase Haldol IV 5 mg every 8 hours as needed. - Minimize restraints as much as possible, off since 4/30, so is able to be discharged.   Acute hypoxic/hypercapnic respiratory failure with ARDS from MRSA pneumonia and aspiration pneumonia, history of asthma: s/p ETT 3/10, tracheostomy 3/29 - 4/28. - Weaned to room air.  - Scheduled and prn BDs  Dysphagia s/p PEG:  - Continue SLP evaluations, advance diet per their recommendations.  - Con stop tube feeds if po intake improves.  Aerococcus bacteremia 3/25 Klebsiella bronchitis/pneumonia 3/25 MRSA pneumonia 4/28. - s/p cefepime x7 days on 4/1 - Continue vancomycin, last dose 5/1 (7 days), recheck WBC not done yet.  Acute large bilateral posterior MCA infarcts 3/23: TEE 3/16 without CES. MRI brain c/w b/l posterior MCA territory infarcts; MRA/ MRV neg - Continue ASA, statin.  - Needs continued PT/OT/SLP  Acute systolic heart failure with sepsis induced CM, HTN: No longer requiring regular diuresis. Daily weights showing slow decline. - Currently on clonidine, amlodipine, triamterene/HCTZ, metoprolol  HLD:  - Continue crestor  Poorly-controlled T2DM: HbA1c 12.3%.  - Continue basal-bolus insulin/CBGs.   Hyponatremia: Resolved.  Hypokalemia: Supplemented.  HIV: CD4 440, VL 20 in March 2021. - Continue ART  At risk malnutrition: S/p PEG placement on 4/5, by IR - Continue with tube feeds as tolerated.  Obesity: Body mass index is 34.91 kg/m.  - Lifestyle modification advised  GOC:  - Palliative consulted and had been following  DVT prophylaxis: SCD's Code Status: Full Family Communication: Daughter by phone previously Status is: Inpatient  Remains inpatient appropriate because: Patient is stable for discharge to SNF.Covid test ordered, awaiting return call from facility to see if they have a bed available. Dispo: The patient is from: Home  Anticipated d/c is to: SNF   Anticipated d/c date is: 01/04/2020  Patient currently is not medically stable to d/c.   Consultants:   PCCM  ID  Neurology  Procedures:   Mechanical ventilation  TEE  Trach changed to cuffless 4/12  PEG placement 4/5 by Dr. Earleen Newport  Subjective: Continues to not require restraints, remains confused however. No pains or shortness of breath. She's eating a little better.  Objective: Vitals:   01/04/20 0600 01/04/20 0759 01/04/20 0815 01/04/20 1154  BP:    120/63  Pulse:  86 72 82  Resp: (!) 21  18   Temp:  98.3 F (36.8 C)  97.8 F (36.6 C)  TempSrc:  Oral  Oral  SpO2:   99%   Weight:      Height:        Intake/Output Summary (Last 24 hours) at 01/04/2020 1343 Last data filed at 01/04/2020 1049 Gross per 24 hour  Intake 240 ml  Output 900 ml  Net -660 ml   Filed Weights   12/31/19 0500 01/02/20 0418 01/03/20 0359  Weight: 88.4 kg 88.8 kg 89.4 kg   Gen: 59 y.o. female in no distress Pulm: Nonlabored breathing room air. Clear. CV: Regular rate and rhythm. No murmur, rub, or gallop. No JVD, no pitting dependent edema. GI: Abdomen soft, non-tender, non-distended, with normoactive bowel sounds.  Ext: Warm, no deformities Skin: No new rashes, lesions or ulcers on visualized skin. PEG site c/d/i. Neuro: Alert and incompletely oriented. No new focal neurological deficits. Psych: Judgement and insight appear impaired. Mood euthymic & affect congruent. Behavior is abnormal.  Data Reviewed: I have personally reviewed following labs and imaging studies  CBC: Recent Labs  Lab 12/30/19 1039 01/02/20 0413  WBC 10.1 10.8*  HGB 11.4* 11.2*  HCT 35.7* 34.7*  MCV 86.0 85.9  PLT 587* 123456*   Basic Metabolic Panel: Recent Labs  Lab 12/30/19 1039 01/02/20 0413  NA 137 137  K 3.4* 3.4*  CL 93* 93*  CO2 32 34*  GLUCOSE 165* 252*  BUN 42* 39*  CREATININE 1.02* 1.07*  CALCIUM 9.6 9.6   GFR: Estimated  Creatinine Clearance: 61.5 mL/min (A) (by C-G formula based on SCr of 1.07 mg/dL (H)). Liver Function Tests: No results for input(s): AST, ALT, ALKPHOS, BILITOT, PROT, ALBUMIN in the last 168 hours. No results for input(s): LIPASE, AMYLASE in the last 168 hours. No results for input(s): AMMONIA in the last 168 hours. Coagulation Profile: No results for input(s): INR, PROTIME in the last 168 hours. Cardiac Enzymes: No results for input(s): CKTOTAL, CKMB, CKMBINDEX, TROPONINI in the last 168 hours. BNP (last 3 results) No results for input(s): PROBNP in the last 8760 hours. HbA1C: No results for input(s): HGBA1C in the last 72 hours. CBG: Recent Labs  Lab 01/03/20 1946 01/03/20 2344 01/04/20 0436 01/04/20 0758 01/04/20 1138  GLUCAP 110* 277* 223* 144* 267*   Lipid Profile: No results for input(s): CHOL, HDL, LDLCALC, TRIG, CHOLHDL, LDLDIRECT in the last 72 hours. Thyroid Function Tests: No results for input(s): TSH, T4TOTAL, FREET4, T3FREE, THYROIDAB in the last 72 hours. Anemia Panel: No results for input(s): VITAMINB12, FOLATE, FERRITIN, TIBC, IRON, RETICCTPCT in the last 72 hours. Urine analysis:    Component Value Date/Time   COLORURINE YELLOW 11/26/2019 1522   APPEARANCEUR CLEAR 11/26/2019 1522   LABSPEC 1.011 11/26/2019 1522   PHURINE 8.0 11/26/2019 1522   GLUCOSEU NEGATIVE 11/26/2019 1522   HGBUR NEGATIVE 11/26/2019 1522   [place] 11/26/2019 1522  KETONESUR NEGATIVE 11/26/2019 1522   PROTEINUR 30 (A) 11/26/2019 1522   UROBILINOGEN 1.0 06/01/2015 0104   NITRITE NEGATIVE 11/26/2019 1522   LEUKOCYTESUR NEGATIVE 11/26/2019 1522   No results found for this or any previous visit (from the past 240 hour(s)).    Radiology Studies: No results found.  Scheduled Meds: . amLODipine  10 mg Per Tube Daily  . aspirin  81 mg Per Tube Daily  . benztropine  0.5 mg Per Tube Daily  . budesonide (PULMICORT) nebulizer solution  0.5 mg Nebulization BID  . busPIRone   15 mg Per Tube TID  . chlorhexidine  15 mL Mouth Rinse BID  . Chlorhexidine Gluconate Cloth  6 each Topical Daily  . cloNIDine  0.2 mg Per Tube TID  . dolutegravir  50 mg Oral Daily  . doxepin  50 mg Per Tube QHS  . emtricitabine-tenofovir AF  1 tablet Per Tube Daily  . enoxaparin (LOVENOX) injection  40 mg Subcutaneous Q24H  . feeding supplement (JEVITY 1.2 CAL)  1,000 mL Per Tube Q24H  . feeding supplement (PRO-STAT SUGAR FREE 64)  30 mL Per Tube TID  . free water  200 mL Per Tube Q6H  . gabapentin  300 mg Per Tube Q8H  . insulin aspart  0-20 Units Subcutaneous Q4H  . insulin glargine  15 Units Subcutaneous BID  . mouth rinse  15 mL Mouth Rinse q12n4p  . metoprolol tartrate  12.5 mg Per Tube BID  . pantoprazole sodium  40 mg Per Tube Daily  . polyethylene glycol  17 g Oral Daily  . potassium chloride  20 mEq Oral Once  . QUEtiapine  200 mg Per Tube BID  . rosuvastatin  20 mg Per Tube q1800  . sennosides  15 mL Per Tube BID  . triamterene-hydrochlorothiazide  1 tablet Per Tube Daily   Continuous Infusions: . sodium chloride Stopped (12/07/19 0216)     LOS: 54 days   Time spent: 25 minutes.  Patrecia Pour, MD [facility] [email or web address] 01/04/2020, 1:43 PM

## 2020-01-04 NOTE — Progress Notes (Signed)
Inpatient Diabetes Program Recommendations  AACE/ADA: New Consensus Statement on Inpatient Glycemic Control   Target Ranges:  Prepandial:   less than 140 mg/dL      Peak postprandial:   less than 180 mg/dL (1-2 hours)      Critically ill patients:  140 - 180 mg/dL   Results for YSIDRA, KANO (MRN [id number]) as of 01/04/2020 12:16  Ref. Range 01/03/2020 08:02 01/03/2020 12:09 01/03/2020 16:29 01/03/2020 19:46 01/03/2020 23:44 01/04/2020 04:36 01/04/2020 07:58 01/04/2020 11:38  Glucose-Capillary Latest Ref Range: 70 - 99 mg/dL 226 (H) 314 (H) 165 (H) 110 (H) 277 (H) 223 (H) 144 (H) 267 (H)   Review of Glycemic Control   Current orders for Inpatient glycemic control: Lantus 15 units BID, Novolog 0-20 units Q4H; Jevity @ 60 ml/hr, Dys 2 diet  Inpatient Diabetes Program Recommendations:   Insulin - Tube Feeding Coverage: Please consider ordering Novolog 3 units Q4H for tube feeding coverage.  If tube feeding is stopped or held then Novolog tube feeding coverage should also be stopped or held.  Thanks, Barnie Alderman, RN, MSN, CDE Diabetes Coordinator Inpatient Diabetes Program [phone] (Team Pager from 8am to 5pm)

## 2020-01-04 NOTE — Progress Notes (Signed)
Pt called nurse into room. Pt holding her PICC line. Site is dry and intact with no bleeding and PICC line is intact. Provider notified.

## 2020-01-04 NOTE — NC FL2 (Signed)
[place] LEVEL OF CARE SCREENING TOOL     IDENTIFICATION  Patient Name: Susan [name] Birthdate: October 13, 1960 Sex: female Admission Date (Current Location): 11/11/2019  [facility] and [place] Number:  Herbalist and Address:  [facility], [place] [address], [place], [place] [address]      Provider Number: [id number]  Attending Physician Name and Address:  Patrecia Pour, MD  Relative Name and Phone Number:  Aishani Petrasek [phone]    Current Level of Care: Hospital Recommended Level of Care: [place] Prior Approval Number:    Date Approved/Denied: 12/15/19 PASRR Number: [id number]  Discharge Plan: SNF    Current Diagnoses: Patient Active Problem List   Diagnosis Date Noted  . Agitation   . Palliative care by specialist   . Adult failure to thrive   . Status post tracheostomy ([place])   . Cerebral embolism with cerebral infarction 11/24/2019  . Acute kidney injury (AKI) with acute tubular necrosis (ATN) (HCC)   . Acute metabolic encephalopathy   . Pneumonia 11/14/2019  . Aortic valve endocarditis 11/14/2019  . Pneumococcal bacteremia 11/13/2019  . Acute respiratory failure with hypoxemia ([place]) 11/11/2019  . Recurrent falls 10/15/2019  . Yeast infection 07/23/2019  . Constipation 02/26/2019  . Schizophrenia ([place]) 02/18/2019  . Acute asthma exacerbation 02/17/2019  . AKI (acute kidney injury) ([place]) 02/17/2019  . Acute respiratory failure with hypoxia ([place]) 02/16/2019  . Right hip pain 02/21/2018  . DNR (do not resuscitate) discussion 08/23/2017  . Weakness 06/24/2017  . Osteoarthritis 07/14/2016  . Abnormal hemoglobin ([place]) 04/18/2016  . Hot flashes 10/16/2015  . PTSD (post-traumatic stress disorder) 05/12/2015  . MDD (major depressive disorder), recurrent episode, moderate ([place]) 05/11/2015  . Drug-induced hypotension 05/11/2015  . Cannabis use disorder, severe, dependence ([place]) 05/11/2015  . Tobacco  use disorder 05/11/2015  . Diabetes mellitus type 2 without retinopathy ([place]) 01/28/2015  . Hereditary and idiopathic peripheral neuropathy 06/03/2013  . Dysfunctional uterine bleeding 06/04/2012  . Dyslipidemia   . GERD (gastroesophageal reflux disease)   . Schizoaffective disorder, bipolar type ([place]) 04/26/2011  . Morbid obesity ([place]) 04/26/2011  . HIV disease ([place]) 03/28/2011  . Hypertension 03/28/2011    Orientation RESPIRATION BLADDER Height & Weight     Self  Tracheostomy(room air) External catheter, Continent Weight: 89.4 kg Height:  5\' 3"  (160 cm)  BEHAVIORAL SYMPTOMS/MOOD NEUROLOGICAL BOWEL NUTRITION STATUS  Other (Comment)(impulsive, restless, agitation) (n/a) Continent Feeding tube, Diet(Tube feeding Jevity 1.2 @60   and Dysphagia 2 diet with nectar thick)  AMBULATORY STATUS COMMUNICATION OF NEEDS Skin   Extensive Assist Verbally Other (Comment), Skin abrasions, Bruising(sacrum & buttocks moisture associated skin demage, abrasion bilateral buttocks foam dressing, blister to left thigh foam dressing,ecchymosis noted to bilateral arms knees thighs,redness noted to abdoment breast and groin, skin tear to thigh,)                       Personal Care Assistance Level of Assistance  Bathing, Feeding, Dressing Bathing Assistance: Maximum assistance Feeding assistance: Limited assistance(set up) Dressing Assistance: Maximum assistance Total Care Assistance: Maximum assistance   Functional Limitations Info  Sight, Hearing, Speech Sight Info: Adequate Hearing Info: Adequate Speech Info: Adequate    SPECIAL CARE FACTORS FREQUENCY  PT (By licensed PT), OT (By licensed OT), Speech therapy     PT Frequency: 5X OT Frequency: 5X     Speech Therapy Frequency: 3X      Contractures Contractures Info: Not present  Additional Factors Info  Code Status, Allergies, Psychotropic, Insulin Sliding Scale, Isolation Precautions, Suctioning Needs Code Status Info:  Full Allergies Info: Lyrica Psychotropic Info: Buspar, doxepin, haldol, seroquel Insulin Sliding Scale Info: Sliding scale and Lantus Isolation Precautions Info: Contact MRSA Suctioning Needs: Suction as needed   Current Medications (01/04/2020):  This is the current hospital active medication list Current Facility-Administered Medications  Medication Dose Route Frequency Provider Last Rate Last Admin  . 0.9 %  sodium chloride infusion   Intravenous PRN Collene Gobble, MD   Stopped at 12/07/19 0216  . amLODipine (NORVASC) tablet 10 mg  10 mg Per Tube Daily Collene Gobble, MD   10 mg at 01/04/20 1056  . aspirin chewable tablet 81 mg  81 mg Per Tube Daily Collene Gobble, MD   81 mg at 01/04/20 1059  . benztropine (COGENTIN) tablet 0.5 mg  0.5 mg Per Tube Daily Collene Gobble, MD   0.5 mg at 01/04/20 1054  . bisacodyl (DULCOLAX) suppository 10 mg  10 mg Rectal Daily PRN Collene Gobble, MD   10 mg at 11/19/19 1600  . budesonide (PULMICORT) nebulizer solution 0.5 mg  0.5 mg Nebulization BID Collene Gobble, MD   0.5 mg at 01/04/20 0814  . busPIRone (BUSPAR) tablet 15 mg  15 mg Per Tube TID Collene Gobble, MD   15 mg at 01/04/20 1058  . chlorhexidine (PERIDEX) 0.12 % solution 15 mL  15 mL Mouth Rinse BID Patrecia Pour, MD   15 mL at 01/04/20 1058  . Chlorhexidine Gluconate Cloth 2 % PADS 6 each  6 each Topical Daily Collene Gobble, MD   6 each at 01/03/20 1014  . cloNIDine (CATAPRES) tablet 0.2 mg  0.2 mg Per Tube TID Omar Person, NP   0.2 mg at 01/04/20 1057  . dolutegravir (TIVICAY) tablet 50 mg  50 mg Oral Daily Patrecia Pour, MD   50 mg at 01/04/20 1054  . doxepin (SINEQUAN) capsule 50 mg  50 mg Per Tube QHS Collene Gobble, MD   50 mg at 01/03/20 2052  . emtricitabine-tenofovir AF (DESCOVY) 200-25 MG per tablet 1 tablet  1 tablet Per Tube Daily Collene Gobble, MD   1 tablet at 01/04/20 1054  . enoxaparin (LOVENOX) injection 40 mg  40 mg Subcutaneous Q24H Patrecia Pour, MD   40 mg  at 01/03/20 1629  . feeding supplement (JEVITY 1.2 CAL) liquid 1,000 mL  1,000 mL Per Tube Q24H Little Ishikawa, MD 60 mL/hr at 01/03/20 1949 1,000 mL at 01/03/20 1949  . feeding supplement (PRO-STAT SUGAR FREE 64) liquid 30 mL  30 mL Per Tube TID Alma Friendly, MD   30 mL at 01/04/20 1051  . free water 200 mL  200 mL Per Tube Q6H Vance Gather B, MD   200 mL at 01/04/20 1049  . gabapentin (NEURONTIN) 250 MG/5ML solution 300 mg  300 mg Per Tube Q8H Collene Gobble, MD   300 mg at 01/04/20 [id number]  . haloperidol lactate (HALDOL) injection 5 mg  5 mg Intravenous Q8H PRN Little Ishikawa, MD   5 mg at 01/03/20 1416  . insulin aspart (novoLOG) injection 0-20 Units  0-20 Units Subcutaneous Q4H Collene Gobble, MD   7 Units at 01/04/20 [phone]  . insulin glargine (LANTUS) injection 15 Units  15 Units Subcutaneous BID Little Ishikawa, MD   15 Units at 01/04/20 1059  . ipratropium-albuterol (DUONEB) 0.5-2.5 (  3) MG/3ML nebulizer solution 3 mL  3 mL Nebulization Q6H PRN Collene Gobble, MD   3 mL at 12/19/19 0902  . MEDLINE mouth rinse  15 mL Mouth Rinse q12n4p Patrecia Pour, MD   15 mL at 01/04/20 1016  . metoprolol tartrate (LOPRESSOR) 25 mg/10 mL oral suspension 12.5 mg  12.5 mg Per Tube BID Omar Person, NP   12.5 mg at 01/04/20 1053  . ondansetron (ZOFRAN) injection 4 mg  4 mg Intravenous Q6H PRN Little Ishikawa, MD   4 mg at 01/01/20 1812  . pantoprazole sodium (PROTONIX) 40 mg/20 mL oral suspension 40 mg  40 mg Per Tube Daily Collene Gobble, MD   40 mg at 01/04/20 1050  . polyethylene glycol (MIRALAX / GLYCOLAX) packet 17 g  17 g Oral Daily Collene Gobble, MD   17 g at 01/03/20 1010  . potassium chloride SA (KLOR-CON) CR tablet 20 mEq  20 mEq Oral Once Patrecia Pour, MD      . QUEtiapine (SEROQUEL) tablet 200 mg  200 mg Per Tube BID Suella Broad, FNP   200 mg at 01/04/20 1056  . Resource ThickenUp Clear   Oral PRN Little Ishikawa, MD      . rosuvastatin  (CRESTOR) tablet 20 mg  20 mg Per Tube q1800 Collene Gobble, MD   20 mg at 01/03/20 1628  . sennosides (SENOKOT) 8.8 MG/5ML syrup 15 mL  15 mL Per Tube BID Collene Gobble, MD   15 mL at 01/04/20 1051  . sodium chloride flush (NS) 0.9 % injection 10-40 mL  10-40 mL Intracatheter PRN Collene Gobble, MD   10 mL at 12/29/19 0029  . sodium phosphate (FLEET) 7-19 GM/118ML enema 1 enema  1 enema Rectal Daily PRN Collene Gobble, MD      . triamterene-hydrochlorothiazide (MAXZIDE-25) 37.5-25 MG per tablet 1 tablet  1 tablet Per Tube Daily Collene Gobble, MD   1 tablet at 01/04/20 1058     Discharge Medications: Please see discharge summary for a list of discharge medications.  Relevant Imaging Results:  Relevant Lab Results:   Additional Information SSN [id number]  Angelita Ingles, RN

## 2020-01-04 NOTE — Progress Notes (Signed)
Physical Therapy Treatment Patient Details Name: Susan [name] MRN: [id number] DOB: [birth year] Today's Date: 01/04/2020    History of Present Illness 59 year old female smoker admitted 3/10 with overdose complicated by aspiration PNA with MRSA,  Pneumococcal bacteremia, toxic metabolic encephalopathy and septic shock.  Frequent falls at home,Tobacco abuse, diabetes with painful polyneuropathy, bipolar disease, GERD, HIV, schizophrenia, history of IV drug abuse, seizure disorder. Intubated 3/10-3/29 3/23 MRI brain >>Acute nonhemorrhagic infarcts in the posterior MCA territory bilaterally. Tracheostomy placed 3/29.  Decannulated 4/28 and weaned to room air.    PT Comments    Patient progressing this session with in room ambulation and able to use RW with good compliance to safety in a moderately open environment.  She would fatigue and needed seated rest between bouts of ambulation, but was excited to walk initially.  She demonstrated some situational apraxia with donning a gown for a robe, but did fine with other functional tasks this session.  She remains somewhat confused though reported plans for home soon with her daughter.  Not sure if she is accurate about daughter being able to assist, but definitely would need very capable and close 24 hour supervision at d/c due to cognitive and safety deficits.  Continue to recommend SNF level rehab at d/c.  PT to follow.   Follow Up Recommendations  SNF     Equipment Recommendations  None recommended by PT    Recommendations for Other Services       Precautions / Restrictions Precautions Precautions: Fall Precaution Comments: PEG Restrictions Weight Bearing Restrictions: No    Mobility  Bed Mobility   Bed Mobility: Supine to Sit     Supine to sit: Min assist;HOB elevated     General bed mobility comments: pulls up on rail and with PT's hand  Transfers Overall transfer level: Needs assistance Equipment used: None;Rolling walker (2  wheeled) Transfers: Sit to/from [facility] Sit to Stand: Min guard Stand pivot transfers: Min assist       General transfer comment: up to [facility] before I could get back from shutting the door and assisted to [facility] cues for safety; no device to [facility], then used walker for ambulation in the room  Ambulation/Gait Ambulation/Gait assistance: Min assist Gait Distance (Feet): 20 Feet(x 2) Assistive device: Rolling walker (2 wheeled) Gait Pattern/deviations: Step-through pattern;Decreased stride length     General Gait Details: assist to ambulate in room with RW to door and back x 2, able to manage RW with turns with min A for safety   Stairs             Wheelchair Mobility    Modified Rankin (Stroke Patients Only) Modified Rankin (Stroke Patients Only) Pre-Morbid Rankin Score: No symptoms Modified Rankin: Moderately severe disability     Balance Overall balance assessment: Needs assistance Sitting-balance support: Feet supported Sitting balance-Leahy Scale: Good     Standing balance support: No upper extremity supported;During functional activity Standing balance-Leahy Scale: Poor Standing balance comment: initially upon standing she tends to hyperextend hips and leans back initially, improves as she ambulates especially with RW                            Cognition Arousal/Alertness: Awake/alert Behavior During Therapy: Impulsive Overall Cognitive Status: Impaired/Different from baseline Area of Impairment: Attention;Memory;Following commands;Safety/judgement;Awareness;Problem solving                   Current Attention Level: Sustained Memory: Decreased short-term  memory;Decreased recall of precautions Following Commands: Follows one step commands consistently;Follows one step commands with increased time Safety/Judgement: Decreased awareness of safety;Decreased awareness of deficits   Problem Solving: Slow processing;Requires verbal  cues;Requires tactile cues;Difficulty sequencing General Comments: with some functional tasks demonstrating apraxia (donning gown on her back she kept placing alternate arms into armholes incorrectly, then tried placing other arm in after I did hand over hand assist to don, but given comb she automatically went to comb her hair and attempted perineal hygiene as well after toileting      Exercises      General Comments General comments (skin integrity, edema, etc.): HR max 100 with ambulation in room on RW, mild dyspnea noted, BP WNL      Pertinent Vitals/Pain Pain Assessment: Faces Faces Pain Scale: No hurt    Home Living                      Prior Function            PT Goals (current goals can now be found in the care plan section) Progress towards PT goals: Progressing toward goals    Frequency    Min 3X/week      PT Plan Current plan remains appropriate    Co-evaluation              AM-PAC PT "6 Clicks" Mobility   Outcome Measure  Help needed turning from your back to your side while in a flat bed without using bedrails?: A Little Help needed moving from lying on your back to sitting on the side of a flat bed without using bedrails?: A Little Help needed moving to and from a bed to a chair (including a wheelchair)?: A Little Help needed standing up from a chair using your arms (e.g., wheelchair or bedside chair)?: A Little Help needed to walk in hospital room?: A Little Help needed climbing 3-5 steps with a railing? : A Lot 6 Click Score: 17    End of Session Equipment Utilized During Treatment: Gait belt Activity Tolerance: Patient tolerated treatment well Patient left: with call bell/phone within reach;in chair;with chair alarm set Nurse Communication: Mobility status PT Visit Diagnosis: Other abnormalities of gait and mobility (R26.89);Other symptoms and signs involving the nervous system (R29.898);Muscle weakness (generalized) (M62.81)      Time: [id number] PT Time Calculation (min) (ACUTE ONLY): 30 min  Charges:  $Gait Training: 8-22 mins $Therapeutic Activity: 8-22 mins                     Susan [name], [place] [phone] 01/04/2020    Susan [name] 01/04/2020, 5:43 PM

## 2020-01-05 ENCOUNTER — Inpatient Hospital Stay (HOSPITAL_COMMUNITY): Payer: Medicare Other

## 2020-01-05 LAB — CBC
HCT: 35.3 % — ABNORMAL LOW (ref 36.0–46.0)
Hemoglobin: 11.5 g/dL — ABNORMAL LOW (ref 12.0–15.0)
MCH: 28 pg (ref 26.0–34.0)
MCHC: 32.6 g/dL (ref 30.0–36.0)
MCV: 86.1 fL (ref 80.0–100.0)
Platelets: 482 10*3/uL — ABNORMAL HIGH (ref 150–400)
RBC: 4.1 MIL/uL (ref 3.87–5.11)
RDW: 16.1 % — ABNORMAL HIGH (ref 11.5–15.5)
WBC: 10 10*3/uL (ref 4.0–10.5)
nRBC: 0 % (ref 0.0–0.2)

## 2020-01-05 LAB — GLUCOSE, CAPILLARY
Glucose-Capillary: 108 mg/dL — ABNORMAL HIGH (ref 70–99)
Glucose-Capillary: 347 mg/dL — ABNORMAL HIGH (ref 70–99)
Glucose-Capillary: 255 mg/dL — ABNORMAL HIGH (ref 70–99)
Glucose-Capillary: 81 mg/dL (ref 70–99)
Glucose-Capillary: 296 mg/dL — ABNORMAL HIGH (ref 70–99)

## 2020-01-05 MED ORDER — INSULIN ASPART 100 UNIT/ML ~~LOC~~ SOLN
0.0000 [IU] | Freq: Every day | SUBCUTANEOUS | Status: DC
  Administered 2020-01-05: 3 [IU] via SUBCUTANEOUS
  Administered 2020-01-07 – 2020-01-08 (×2): 2 [IU] via SUBCUTANEOUS

## 2020-01-05 MED ORDER — BUSPIRONE HCL 15 MG PO TABS: 15.0000 mg | ORAL_TABLET | Freq: Three times a day (TID) | ORAL | Status: DC

## 2020-01-05 MED ORDER — INSULIN ASPART 100 UNIT/ML ~~LOC~~ SOLN: 0.0000 [IU] | Freq: Three times a day (TID) | SUBCUTANEOUS | 11 refills | Status: DC

## 2020-01-05 MED ORDER — CARVEDILOL 3.125 MG PO TABS
3.1250 mg | ORAL_TABLET | Freq: Two times a day (BID) | ORAL | 11 refills | Status: DC
Start: 2020-01-05 — End: 2020-01-06

## 2020-01-05 MED ORDER — GABAPENTIN 400 MG PO CAPS: 400.0000 mg | ORAL_CAPSULE | Freq: Three times a day (TID) | ORAL | 3 refills | Status: DC

## 2020-01-05 MED ORDER — INSULIN GLARGINE 100 UNIT/ML ~~LOC~~ SOLN: 30.0000 [IU] | Freq: Every day | SUBCUTANEOUS | 11 refills | Status: DC

## 2020-01-05 MED ORDER — ASPIRIN 81 MG PO CHEW: 81.0000 mg | CHEWABLE_TABLET | Freq: Every day | ORAL | Status: DC

## 2020-01-05 MED ORDER — ENSURE ENLIVE PO LIQD
237.0000 mL | Freq: Two times a day (BID) | ORAL | Status: DC
  Administered 2020-01-05 – 2020-01-08 (×7): 237 mL via ORAL

## 2020-01-05 MED ORDER — INSULIN GLARGINE 100 UNIT/ML ~~LOC~~ SOLN: 15.0000 [IU] | Freq: Two times a day (BID) | SUBCUTANEOUS | 11 refills | Status: DC

## 2020-01-05 MED ORDER — LISINOPRIL 2.5 MG PO TABS: 2.5000 mg | ORAL_TABLET | Freq: Every day | ORAL | Status: DC

## 2020-01-05 MED ORDER — INSULIN ASPART 100 UNIT/ML ~~LOC~~ SOLN
0.0000 [IU] | Freq: Three times a day (TID) | SUBCUTANEOUS | Status: DC
  Administered 2020-01-06: 7 [IU] via SUBCUTANEOUS
  Administered 2020-01-06: 4 [IU] via SUBCUTANEOUS
  Administered 2020-01-06: 20 [IU] via SUBCUTANEOUS
  Administered 2020-01-07: 3 [IU] via SUBCUTANEOUS
  Administered 2020-01-07: 7 [IU] via SUBCUTANEOUS
  Administered 2020-01-07: 11 [IU] via SUBCUTANEOUS
  Administered 2020-01-08 (×2): 4 [IU] via SUBCUTANEOUS
  Administered 2020-01-08 – 2020-01-09 (×3): 11 [IU] via SUBCUTANEOUS

## 2020-01-05 MED ORDER — POTASSIUM CHLORIDE 20 MEQ/15ML (10%) PO SOLN
20.0000 meq | Freq: Every day | ORAL | Status: DC
  Administered 2020-01-05 – 2020-01-06 (×2): 20 meq
  Filled 2020-01-05 (×2): qty 15

## 2020-01-05 NOTE — Progress Notes (Signed)
Inpatient Diabetes Program Recommendations  AACE/ADA: New Consensus Statement on Inpatient Glycemic Control (2015)  Target Ranges:  Prepandial:   less than 140 mg/dL      Peak postprandial:   less than 180 mg/dL (1-2 hours)      Critically ill patients:  140 - 180 mg/dL   Lab Results  Component Value Date   GLUCAP 255 (H) 01/05/2020   HGBA1C 12.3 (H) 11/11/2019    Review of Glycemic Control Results for Susan [name], Susan [name] (MRN [id number]) as of 01/05/2020 12:35  Ref. Range 01/04/2020 20:29 01/04/2020 23:13 01/05/2020 03:13 01/05/2020 08:29 01/05/2020 12:09  Glucose-Capillary Latest Ref Range: 70 - 99 mg/dL 148 (H) 222 (H) 108 (H) 347 (H) 255 (H)   Current orders for Inpatient glycemic control: Lantus 15 units BID, Novolog 0-20 units Q4H; Jevity @ 60 ml/hr, Dys 2 diet  Inpatient Diabetes Program Recommendations:  Insulin - Meal Coverage: Tube feed coverage, Novolog 3 units Q4H.  Stop if tube feeds held or discontinued   Thank you, Reche Dixon, RN, BSN Diabetes Coordinator Inpatient Diabetes Program [phone] (team pager from 8a-5p)

## 2020-01-05 NOTE — Progress Notes (Signed)
Physical Therapy Treatment Patient Details Name: Susan [name] MRN: [id number] DOB: 1960-10-01 Today's Date: 01/05/2020    History of Present Illness 59 year old female smoker admitted 3/10 with overdose complicated by aspiration PNA with MRSA,  Pneumococcal bacteremia, toxic metabolic encephalopathy and septic shock.  Frequent falls at home,Tobacco abuse, diabetes with painful polyneuropathy, bipolar disease, GERD, HIV, schizophrenia, history of IV drug abuse, seizure disorder. Intubated 3/10-3/29 3/23 MRI brain >>Acute nonhemorrhagic infarcts in the posterior MCA territory bilaterally. Tracheostomy placed 3/29.  Decannulated 4/28 and weaned to room air.    PT Comments    Patient progressing distance with ambulation with rollator for safety with safe place to sit which she needed when having some decreased responsiveness to questions in the hallway.  Noted no LOB though still with posterior lean from hips initially standing.  She also had further evidence of apraxia in the hallway unable to comply with direction change to return to her room and needing to sit on rollator and have assist to roll back to her room.  She remains appropriate for SNF level rehab.  PT to follow.    Follow Up Recommendations  SNF     Equipment Recommendations  None recommended by PT    Recommendations for Other Services       Precautions / Restrictions Precautions Precautions: Fall Precaution Comments: PEG    Mobility  Bed Mobility               General bed mobility comments: up OOB in recliner  Transfers Overall transfer level: Needs assistance Equipment used: 4-wheeled walker Transfers: Sit to/from Stand Sit to Stand: Min guard         General transfer comment: waited for me to set up everything prior to standing, used rollator in hallway  Ambulation/Gait Ambulation/Gait assistance: Min assist;Mod assist Gait Distance (Feet): 60 Feet(&80') Assistive device: 4-wheeled walker Gait  Pattern/deviations: Decreased stride length;Step-through pattern     General Gait Details: walking to hallway with rollator needed seated rest due to some decreased responsiveness to questions in hallway, then she passed her room walking back and was trying to enter a different room,  made her sit to recover, but then still unable to organize spatially how to return to her room so pushed her on rollator back to the room for safety   Stairs             Wheelchair Mobility    Modified Rankin (Stroke Patients Only) Modified Rankin (Stroke Patients Only) Pre-Morbid Rankin Score: No symptoms Modified Rankin: Moderately severe disability     Balance Overall balance assessment: Needs assistance   Sitting balance-Leahy Scale: Good Sitting balance - Comments: leaning over to don socks seated on recliner     Standing balance-Leahy Scale: Poor Standing balance comment: initially upon standing she tends to hyperextend hips and leans back initially, improves as she ambulates especially with RW                            Cognition Arousal/Alertness: Awake/alert   Overall Cognitive Status: Impaired/Different from baseline Area of Impairment: Attention;Memory;Following commands;Safety/judgement;Awareness;Problem solving                   Current Attention Level: Sustained Memory: Decreased short-term memory;Decreased recall of precautions Following Commands: Follows one step commands consistently;Follows one step commands with increased time Safety/Judgement: Decreased awareness of safety;Decreased awareness of deficits   Problem Solving: Slow processing;Requires verbal cues;Requires tactile cues;Difficulty sequencing  Exercises      General Comments General comments (skin integrity, edema, etc.): VSS wtih ambulation      Pertinent Vitals/Pain Pain Assessment: No/denies pain    Home Living                      Prior Function             PT Goals (current goals can now be found in the care plan section) Progress towards PT goals: Progressing toward goals    Frequency    Min 3X/week      PT Plan Current plan remains appropriate    Co-evaluation              AM-PAC PT "6 Clicks" Mobility   Outcome Measure  Help needed turning from your back to your side while in a flat bed without using bedrails?: A Little Help needed moving from lying on your back to sitting on the side of a flat bed without using bedrails?: A Little Help needed moving to and from a bed to a chair (including a wheelchair)?: A Little Help needed standing up from a chair using your arms (e.g., wheelchair or bedside chair)?: A Little Help needed to walk in hospital room?: A Little Help needed climbing 3-5 steps with a railing? : A Lot 6 Click Score: 17    End of Session   Activity Tolerance: Patient limited by fatigue Patient left: in chair;with call bell/phone within reach   PT Visit Diagnosis: Other abnormalities of gait and mobility (R26.89);Other symptoms and signs involving the nervous system (R29.898);Muscle weakness (generalized) (M62.81)     Time: [id number] PT Time Calculation (min) (ACUTE ONLY): 30 min  Charges:  $Gait Training: 8-22 mins $Therapeutic Activity: 8-22 mins                     Susan [name], PT Acute Rehabilitation Services [phone] 01/05/2020    Susan [name] 01/05/2020, 5:05 PM

## 2020-01-05 NOTE — Discharge Summary (Signed)
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 Nectar Straw WFL Pharyngeal -- Pharyngeal- Thin Teaspoon -- Pharyngeal -- Pharyngeal- Thin Cup -- Pharyngeal -- Pharyngeal- Thin Straw Penetration/Aspiration during swallow Pharyngeal Material enters airway, remains ABOVE vocal cords and not ejected out;Material enters airway, remains ABOVE vocal cords then ejected out;Material enters airway, CONTACTS cords and then ejected out;Material enters airway, CONTACTS cords and not ejected out Pharyngeal- Puree NT Pharyngeal -- Pharyngeal- Mechanical Soft -- Pharyngeal -- Pharyngeal- Regular -- Pharyngeal -- Pharyngeal- Multi-consistency -- Pharyngeal -- Pharyngeal- Pill -- Pharyngeal -- Pharyngeal Comment --  CHL IP CERVICAL ESOPHAGEAL PHASE 12/17/2019 Cervical Esophageal Phase (No Data) Pudding Teaspoon -- Pudding Cup -- Honey Teaspoon -- Honey Cup -- Nectar Teaspoon -- Nectar Cup -- Nectar Straw -- Thin Teaspoon -- Thin Cup -- Thin Straw -- Puree -- Mechanical Soft -- Regular -- Multi-consistency -- Pill -- Cervical Esophageal Comment -- [name] 12/17/2019,  3:20 PM Orbie Pyo Litaker M.Ed Actor Pager [phone] Office [phone]                 Subjective: Doing much better overall, eager for discharge. Eating better, no choking or dyspnea. Animated with abnormal affect, but not agitated.   Discharge Exam: Vitals:   01/05/20 0816 01/05/20 1547  BP: 124/70 (!) 98/47  Pulse:  83  Resp: 13 18  Temp: 97.7 F (36.5 C) 98.6 F (37 C)  SpO2: 96% 98%   Gen: 59 y.o. female in no distress Pulm: Nonlabored breathing room air. Clear. CV: Regular rate and rhythm. No murmur, rub, or gallop. No JVD, no dependent edema. GI: Abdomen soft, non-tender, non-distended, with normoactive bowel sounds.  Ext: Warm, no deformities Skin: No rashes, lesions or ulcers on visualized skin. PEG site w/o discharge. Neuro: Alert and incompletely  oriented. No focal neurological deficits. Psych: Judgement and insight appear impaired, no agitation.    Labs: BNP (last 3 results) Recent Labs    02/16/19 1853 11/13/19 1116 11/17/19 0341  BNP 103.8* 798.4* [id number]*   Basic Metabolic Panel: Recent Labs  Lab 12/30/19 1039 01/02/20 0413 01/04/20 2246  NA 137 137 138  K 3.4* 3.4* 3.3*  CL 93* 93* 97*  CO2 32 34* 30  GLUCOSE 165* 252* 231*  BUN 42* 39* 40*  CREATININE 1.02* 1.07* 0.98  CALCIUM 9.6 9.6 9.5   Liver Function Tests: No results for input(s): AST, ALT, ALKPHOS, BILITOT, PROT, ALBUMIN in the last 168 hours. No results for input(s): LIPASE, AMYLASE in the last 168 hours. No results for input(s): AMMONIA in the last 168 hours. CBC: Recent Labs  Lab 12/30/19 1039 01/02/20 0413 01/04/20 2246  WBC 10.1 10.8* 10.0  HGB 11.4* 11.2* 11.5*  HCT 35.7* 34.7* 35.3*  MCV 86.0 85.9 86.1  PLT 587* 520* 482*   Cardiac Enzymes: No results for input(s): CKTOTAL, CKMB, CKMBINDEX, TROPONINI in the last 168 hours. BNP: Invalid input(s): POCBNP CBG: Recent Labs  Lab 01/04/20 2313 01/05/20 0313 01/05/20 0829 01/05/20 1209  01/05/20 1545  GLUCAP 222* 108* 347* 255* 81   D-Dimer No results for input(s): DDIMER in the last 72 hours. Hgb A1c No results for input(s): HGBA1C in the last 72 hours. Lipid Profile No results for input(s): CHOL, HDL, LDLCALC, TRIG, CHOLHDL, LDLDIRECT in the last 72 hours. Thyroid function studies No results for input(s): TSH, T4TOTAL, T3FREE, THYROIDAB in the last 72 hours.  Invalid input(s): FREET3 Anemia work up No results for input(s): VITAMINB12, FOLATE, FERRITIN, TIBC, IRON, RETICCTPCT in the last 72 hours. Urinalysis    Component Value Date/Time   COLORURINE YELLOW 11/26/2019 1522   APPEARANCEUR CLEAR 11/26/2019 1522   LABSPEC 1.011 11/26/2019 1522   PHURINE 8.0 11/26/2019 1522   GLUCOSEU NEGATIVE 11/26/2019 1522   HGBUR NEGATIVE 11/26/2019 1522   BILIRUBINUR NEGATIVE 11/26/2019 1522   KETONESUR NEGATIVE 11/26/2019 1522   PROTEINUR 30 (A) 11/26/2019 1522   UROBILINOGEN 1.0 06/01/2015 0104   NITRITE NEGATIVE 11/26/2019 1522   LEUKOCYTESUR NEGATIVE 11/26/2019 1522    Microbiology Recent Results (from the past 240 hour(s))  SARS CORONAVIRUS 2 (TAT 6-24 HRS) Nasopharyngeal Nasopharyngeal Swab     Status: None   Collection Time: 01/04/20  4:03 PM   Specimen: Nasopharyngeal Swab  Result Value Ref Range Status   SARS Coronavirus 2 NEGATIVE NEGATIVE Final    Comment: (NOTE) SARS-CoV-2 target nucleic acids are NOT DETECTED. The SARS-CoV-2 RNA is generally detectable in upper and lower respiratory specimens during the acute phase of infection. Negative results do not preclude SARS-CoV-2 infection, do not rule out co-infections with other pathogens, and should not be used as the sole basis for treatment or other patient management decisions. Negative results must be combined with clinical observations, patient history, and epidemiological information. The expected result is Negative. Fact Sheet for Patients: SugarRoll.be Fact Sheet for  Healthcare Providers: https://www.woods-mathews.com/ This test is not yet approved or cleared by the [place] FDA and  has been authorized for detection and/or diagnosis of SARS-CoV-2 by FDA under an Emergency Use Authorization (EUA). This EUA will remain  in effect (meaning this test can be used) for the duration of the COVID-19 declaration under Section 56 4(b)(1) of the Act, 21 U.S.C. section 360bbb-3(b)(1), unless the authorization is terminated or revoked sooner. Performed at [facility], [place],  [place] [address]     Time coordinating discharge: Approximately 40 minutes  Patrecia Pour, MD  [facility] 01/05/2020, 5:17 PM

## 2020-01-05 NOTE — Progress Notes (Signed)
Modified Barium Swallow Progress Note  Patient Details  Name: Susan [name] MRN: [id number] Date of Birth: 08-27-1961  Today's Date: 01/05/2020  Modified Barium Swallow completed.  Full report located under Chart Review in the Imaging Section.  Brief recommendations include the following:  Clinical Impression Ms. Lintner was seen today with improved swallow function. She demonstrates overall swallow function WNL. Oral phase was marked by impaired dentition (edentulous; dentures not present), which impacted mastication. Pt deferred regular solid trials, but had adequate mastication and oral clearance for ground consistency trials. Pharyngeal phase was marked by swallow trigger at valleculae with liquids. She had mildly reduced BOT retraction and epiglottic inversion, which resulted in trace and transient penetration of thin liquids both from cup and straw. Esophageal phase appeared WNL with tablet passing easily. No aspiration was seen on this study.  Based on these findings, pt appears appropriate for upgrade to thin liquids via cup or straw, and soft solids (dysphagia level 2); meds as tolerated. Pt educated on strategies including small bites/sips and alternating solids/liquids to clear residue. She demonstrated understanding, but was very focusing on her imminent discharge. No further ST indicated at this time.    Swallow Evaluation Recommendations   SLP Diet Recommendations: Dysphagia 2 (Fine chop) solids;Thin liquid   Liquid Administration via: Cup;Straw   Medication Administration: Whole meds with liquid   Supervision: Patient able to self feed     Oral Care Recommendations: Oral care QID     Alegra Rost P. Yoan Sallade, M.S., [place] Pathologist Acute Rehabilitation Services Pager: [place] 01/05/2020,11:27 AM
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
## 2020-01-05 NOTE — Progress Notes (Signed)
Nutrition Follow-up  DOCUMENTATION CODES:   Obesity unspecified  INTERVENTION:   - Ensure Enlive po BID, each supplement provides 350 kcal and 20 grams of protein  - Magic cup TID with meals, each supplement provides 290 kcal and 9 grams of protein  - Vital Cuisine Shake TID, each supplement provides 520 kcal and 22 grams of protein  - d/c tube feeding orders  NUTRITION DIAGNOSIS:   Inadequate oral intake related to inability to eat as evidenced by NPO status.  Progressing, pt now on dysphagia 2 diet with thin liquids  GOAL:   Patient will meet greater than or equal to 90% of their needs  Progressing  MONITOR:   PO intake, Supplement acceptance, Diet advancement, Labs, Weight trends, TF tolerance  REASON FOR ASSESSMENT:   Ventilator, Consult Enteral/tube feeding initiation and management  ASSESSMENT:   59 yo female admitted S/P fall at home, unresponsive, hypotensive. Intubated by EMS. Suspected overdose with toxic/metabolic encephalopathy and aspiration PNA. PMH includes schizophrenia, HIV, DM, neuropathy, HTN.  3/22 - CT head positive for stroke 3/23 -MRI revealed bilateral acute infarcts in the posterior MCA territory 3/29 - s/p trach 4/05 - s/p PEG 4/06 - Cortrak removed 4/15 - s/p MBSS recommend continue NPO with trial of nectar and puree with SLP 4/27 - s/p MBSS with diet advanced to dysphagia 1 and nectar-thick liquids 4/28 - decannulated, diet advanced to dysphagia 2 with nectar-thick liquids  Attempted to speak with pt at bedside. However, pt did not awaken to RN voice. Pt is s/p MBSS with diet advanced to dysphagia 2 with thin liquids.  Discussed TF with MD. Given improved PO intake, will d/c tube feeding orders and monitor. Discussed with RN.  Current TF: Jevity 1.2 @ 60 ml/hr x 12 hours from 2000 to 0800, Pro-stat 30 ml TID, free water 200 ml q 6 hours  Current weight: 89.3 kg Admission weight: 114 kg  Meal Completion: 0-80% x 7 recorded meals  (improving)  Medications reviewed and include: SSI q 4 hours, Lantus 15 units BID, protonix, miralax, KCl 20 mEq daily, senokot  Labs reviewed: potassium 3.3 CBG's: 108-347 x 24 hours  Diet Order:   Diet Order            DIET DYS 2 Room service appropriate? Yes; Fluid consistency: Thin  Diet effective now              EDUCATION NEEDS:   Not appropriate for education at this time  Skin:  Skin Assessment: Skin Integrity Issues: Other: non-pressure wound buttocks  Last BM:  01/05/20 medium type 4  Height:   Ht Readings from Last 1 Encounters:  11/17/19 5\' 3"  (1.6 m)    Weight:   Wt Readings from Last 1 Encounters:  01/05/20 89.3 kg    Ideal Body Weight:  52.3 kg  BMI:  Body mass index is 34.87 kg/m.  Estimated Nutritional Needs:   Kcal:  1700-1900  Protein:  105-130 gm  Fluid:  >/= 1.7 L    Gaynell Face, MS, RD, LDN Inpatient Clinical Dietitian Pager: [phone] Weekend/After Hours: [phone]
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
## 2020-01-05 NOTE — NC FL2 (Signed)
[place] LEVEL OF CARE SCREENING TOOL     IDENTIFICATION  Patient Name: Susan [name] Birthdate: [birth year] Sex: female Admission Date (Current Location): 11/11/2019  [facility] [place] Number:  Herbalist and Address:  [facility] [place], [place] [address] [place], [place] [address]      Provider Number: [id number]  Attending Physician Name and Address:  Patrecia Pour, MD  Relative Name and Phone Number:  Dezeray Marciel [phone]    Current Level of Care: Hospital Recommended Level of Care: [place] Prior Approval Number:    Date Approved/Denied: 12/15/19 PASRR Number: [id number]  Discharge Plan: SNF    Current Diagnoses: Patient Active Problem List   Diagnosis Date Noted  . Agitation   . Palliative care by specialist   . Adult failure to thrive   . Status post tracheostomy ([place])   . Cerebral embolism with cerebral infarction 11/24/2019  . Acute kidney injury (AKI) with acute tubular necrosis (ATN) (HCC)   . Acute metabolic encephalopathy   . Pneumonia 11/14/2019  . Aortic valve endocarditis 11/14/2019  . Pneumococcal bacteremia 11/13/2019  . Acute respiratory failure with hypoxemia ([place]) 11/11/2019  . Recurrent falls 10/15/2019  . Yeast infection 07/23/2019  . Constipation 02/26/2019  . Schizophrenia ([place]) 02/18/2019  . Acute asthma exacerbation 02/17/2019  . AKI (acute kidney injury) ([place]) 02/17/2019  . Acute respiratory failure with hypoxia ([place]) 02/16/2019  . Right hip pain 02/21/2018  . DNR (do not resuscitate) discussion 08/23/2017  . Weakness 06/24/2017  . Osteoarthritis 07/14/2016  . Abnormal hemoglobin ([place]) 04/18/2016  . Hot flashes 10/16/2015  . PTSD (post-traumatic stress disorder) 05/12/2015  . MDD (major depressive disorder), recurrent episode, moderate ([place]) 05/11/2015  . Drug-induced hypotension 05/11/2015  . Cannabis use disorder, severe, dependence ([place]) 05/11/2015  . Tobacco  use disorder 05/11/2015  . Diabetes mellitus type 2 without retinopathy ([place]) 01/28/2015  . Hereditary and idiopathic peripheral neuropathy 06/03/2013  . Dysfunctional uterine bleeding 06/04/2012  . Dyslipidemia   . GERD (gastroesophageal reflux disease)   . Schizoaffective disorder, bipolar type ([place]) 04/26/2011  . Morbid obesity ([place]) 04/26/2011  . HIV disease ([place]) 03/28/2011  . Hypertension 03/28/2011    Orientation RESPIRATION BLADDER Height & Weight     Self  Normal(decannulated) External catheter, Continent Weight: 89.3 kg Height:  5\' 3"  (160 cm)  BEHAVIORAL SYMPTOMS/MOOD NEUROLOGICAL BOWEL NUTRITION STATUS  Other (Comment)(impulsive, restless, agitation) (n/a) Continent Diet(Dysphagia with nectar thick liquids)  AMBULATORY STATUS COMMUNICATION OF NEEDS Skin   Minimal assist Verbally Other (Comment), Skin abrasions, Bruising(sacrum & buttocks moisture associated skin demage, abrasion bilateral buttocks foam dressing, blister to left thigh foam dressing,ecchymosis noted to bilateral arms knees thighs,redness noted to abdoment breast and groin, skin tear to thigh,)                       Personal Care Assistance Level of Assistance  Bathing, Feeding, Dressing Bathing Assistance: Limited assistance Feeding assistance: Limited assistance(set up) Dressing Assistance: Maximum assistance Total Care Assistance: Limited assistance   Functional Limitations Info  Sight, Hearing, Speech Sight Info: Adequate Hearing Info: Adequate Speech Info: Adequate    SPECIAL CARE FACTORS FREQUENCY  PT (By licensed PT), OT (By licensed OT), Speech therapy     PT Frequency: 5X OT Frequency: 5X     Speech Therapy Frequency: 3X      Contractures Contractures Info: Not present    Additional Factors Info  Code Status, Allergies, Psychotropic,  Insulin Sliding Scale, Isolation Precautions, Suctioning Needs Code Status Info: Full Allergies Info: Lyrica Psychotropic Info: Buspar,  doxepin, haldol, seroquel Insulin Sliding Scale Info: Sliding scale and Lantus Isolation Precautions Info: Contact MRSA Suctioning Needs: Suction as needed   Current Medications (01/05/2020):  This is the current hospital active medication list Current Facility-Administered Medications  Medication Dose Route Frequency Provider Last Rate Last Admin  . 0.9 %  sodium chloride infusion   Intravenous PRN Collene Gobble, MD   Stopped at 12/07/19 0216  . amLODipine (NORVASC) tablet 10 mg  10 mg Per Tube Daily Collene Gobble, MD   10 mg at 01/05/20 0843  . aspirin chewable tablet 81 mg  81 mg Per Tube Daily Collene Gobble, MD   81 mg at 01/05/20 0843  . benztropine (COGENTIN) tablet 0.5 mg  0.5 mg Per Tube Daily Collene Gobble, MD   0.5 mg at 01/05/20 1245  . bisacodyl (DULCOLAX) suppository 10 mg  10 mg Rectal Daily PRN Collene Gobble, MD   10 mg at 11/19/19 1600  . busPIRone (BUSPAR) tablet 15 mg  15 mg Per Tube TID Collene Gobble, MD   15 mg at 01/05/20 0844  . chlorhexidine (PERIDEX) 0.12 % solution 15 mL  15 mL Mouth Rinse BID Patrecia Pour, MD   15 mL at 01/04/20 1058  . Chlorhexidine Gluconate Cloth 2 % PADS 6 each  6 each Topical Daily Collene Gobble, MD   6 each at 01/04/20 1549  . cloNIDine (CATAPRES) tablet 0.2 mg  0.2 mg Per Tube TID Omar Person, NP   0.2 mg at 01/05/20 0843  . dolutegravir (TIVICAY) tablet 50 mg  50 mg Oral Daily Patrecia Pour, MD   50 mg at 01/05/20 0844  . doxepin (SINEQUAN) capsule 50 mg  50 mg Per Tube QHS Collene Gobble, MD   50 mg at 01/04/20 2121  . emtricitabine-tenofovir AF (DESCOVY) 200-25 MG per tablet 1 tablet  1 tablet Per Tube Daily Collene Gobble, MD   1 tablet at 01/05/20 0844  . enoxaparin (LOVENOX) injection 40 mg  40 mg Subcutaneous Q24H Patrecia Pour, MD   40 mg at 01/04/20 1616  . feeding supplement (ENSURE ENLIVE) (ENSURE ENLIVE) liquid 237 mL  237 mL Oral BID BM Vance Gather B, MD      . free water 200 mL  200 mL Per Tube Q6H Vance Gather B, MD   200 mL at 01/05/20 1248  . gabapentin (NEURONTIN) 250 MG/5ML solution 300 mg  300 mg Per Tube Q8H Collene Gobble, MD   300 mg at 01/05/20 0557  . haloperidol lactate (HALDOL) injection 5 mg  5 mg Intramuscular Q8H PRN Opyd, Ilene Qua, MD      . insulin aspart (novoLOG) injection 0-20 Units  0-20 Units Subcutaneous Q4H Collene Gobble, MD   11 Units at 01/05/20 1246  . insulin glargine (LANTUS) injection 15 Units  15 Units Subcutaneous BID Little Ishikawa, MD   15 Units at 01/05/20 [phone]  . ipratropium-albuterol (DUONEB) 0.5-2.5 (3) MG/3ML nebulizer solution 3 mL  3 mL Nebulization Q6H PRN Collene Gobble, MD   3 mL at 12/19/19 0902  . MEDLINE mouth rinse  15 mL Mouth Rinse q12n4p Patrecia Pour, MD   15 mL at 01/04/20 1550  . metoprolol tartrate (LOPRESSOR) 25 mg/10 mL oral suspension 12.5 mg  12.5 mg Per Tube BID Omar Person, NP  12.5 mg at 01/05/20 0846  . ondansetron (ZOFRAN-ODT) disintegrating tablet 4 mg  4 mg Oral Q8H PRN Opyd, Ilene Qua, MD      . pantoprazole sodium (PROTONIX) 40 mg/20 mL oral suspension 40 mg  40 mg Per Tube Daily Collene Gobble, MD   40 mg at 01/05/20 0856  . polyethylene glycol (MIRALAX / GLYCOLAX) packet 17 g  17 g Oral Daily Collene Gobble, MD   17 g at 01/03/20 1010  . potassium chloride 20 MEQ/15ML (10%) solution 20 mEq  20 mEq Per Tube Daily Patrecia Pour, MD   20 mEq at 01/05/20 0845  . QUEtiapine (SEROQUEL) tablet 200 mg  200 mg Per Tube BID Suella Broad, FNP   200 mg at 01/05/20 0844  . Resource ThickenUp Clear   Oral PRN Little Ishikawa, MD      . rosuvastatin (CRESTOR) tablet 20 mg  20 mg Per Tube q1800 Collene Gobble, MD   20 mg at 01/04/20 1615  . sennosides (SENOKOT) 8.8 MG/5ML syrup 15 mL  15 mL Per Tube BID Collene Gobble, MD   15 mL at 01/04/20 2123  . sodium chloride flush (NS) 0.9 % injection 10-40 mL  10-40 mL Intracatheter PRN Collene Gobble, MD   10 mL at 12/29/19 0029  . sodium phosphate (FLEET) 7-19  GM/118ML enema 1 enema  1 enema Rectal Daily PRN Collene Gobble, MD      . triamterene-hydrochlorothiazide (MAXZIDE-25) 37.5-25 MG per tablet 1 tablet  1 tablet Per Tube Daily Collene Gobble, MD   1 tablet at 01/05/20 [id number]     Discharge Medications: Please see discharge summary for a list of discharge medications.  Relevant Imaging Results:  Relevant Lab Results:   Additional Information SSN [id number]  Angelita Ingles, RN

## 2020-01-06 LAB — GLUCOSE, CAPILLARY
Glucose-Capillary: 185 mg/dL — ABNORMAL HIGH (ref 70–99)
Glucose-Capillary: 405 mg/dL — ABNORMAL HIGH (ref 70–99)
Glucose-Capillary: 244 mg/dL — ABNORMAL HIGH (ref 70–99)
Glucose-Capillary: 116 mg/dL — ABNORMAL HIGH (ref 70–99)

## 2020-01-06 MED ORDER — CLONIDINE HCL 0.2 MG PO TABS: 0.2000 mg | ORAL_TABLET | Freq: Three times a day (TID) | ORAL | 0 refills | Status: DC

## 2020-01-06 MED ORDER — PANTOPRAZOLE SODIUM 40 MG PO PACK
40.0000 mg | PACK | Freq: Every day | ORAL | Status: DC
  Administered 2020-01-07 – 2020-01-09 (×3): 40 mg via ORAL
  Filled 2020-01-06 (×4): qty 20

## 2020-01-06 MED ORDER — BUSPIRONE HCL 10 MG PO TABS
15.0000 mg | ORAL_TABLET | Freq: Three times a day (TID) | ORAL | Status: DC
  Administered 2020-01-06 – 2020-01-09 (×10): 15 mg via ORAL
  Filled 2020-01-06 (×11): qty 2

## 2020-01-06 MED ORDER — SENNOSIDES 8.8 MG/5ML PO SYRP
15.0000 mL | ORAL_SOLUTION | Freq: Every evening | ORAL | Status: DC | PRN
  Filled 2020-01-06: qty 15

## 2020-01-06 MED ORDER — CARVEDILOL 3.125 MG PO TABS
3.1250 mg | ORAL_TABLET | Freq: Two times a day (BID) | ORAL | 0 refills | Status: DC
Start: 2020-01-06 — End: 2020-08-17

## 2020-01-06 MED ORDER — ASPIRIN 81 MG PO CHEW
81.0000 mg | CHEWABLE_TABLET | Freq: Every day | ORAL | Status: DC
  Administered 2020-01-07 – 2020-01-09 (×3): 81 mg via ORAL
  Filled 2020-01-06 (×4): qty 1

## 2020-01-06 MED ORDER — GABAPENTIN 300 MG PO CAPS
300.0000 mg | ORAL_CAPSULE | Freq: Three times a day (TID) | ORAL | Status: DC
  Administered 2020-01-06 – 2020-01-09 (×10): 300 mg via ORAL
  Filled 2020-01-06 (×11): qty 1

## 2020-01-06 MED ORDER — AMLODIPINE BESYLATE 10 MG PO TABS: 10.0000 mg | ORAL_TABLET | Freq: Every day | ORAL | 0 refills | Status: DC

## 2020-01-06 MED ORDER — EMTRICITABINE-TENOFOVIR AF 200-25 MG PO TABS
1.0000 | ORAL_TABLET | Freq: Every day | ORAL | Status: DC
  Administered 2020-01-07 – 2020-01-09 (×3): 1 via ORAL
  Filled 2020-01-06 (×3): qty 1

## 2020-01-06 MED ORDER — POTASSIUM CHLORIDE 20 MEQ/15ML (10%) PO SOLN
20.0000 meq | Freq: Every day | ORAL | Status: DC
  Administered 2020-01-07 – 2020-01-09 (×3): 20 meq via ORAL
  Filled 2020-01-06 (×4): qty 15

## 2020-01-06 MED ORDER — QUETIAPINE FUMARATE 100 MG PO TABS
200.0000 mg | ORAL_TABLET | Freq: Two times a day (BID) | ORAL | Status: DC
  Administered 2020-01-06 – 2020-01-09 (×6): 200 mg via ORAL
  Filled 2020-01-06 (×7): qty 2

## 2020-01-06 MED ORDER — AMLODIPINE BESYLATE 10 MG PO TABS
10.0000 mg | ORAL_TABLET | Freq: Every day | ORAL | Status: DC
  Administered 2020-01-07 – 2020-01-09 (×3): 10 mg via ORAL
  Filled 2020-01-06 (×4): qty 1

## 2020-01-06 MED ORDER — BUSPIRONE HCL 15 MG PO TABS: 15.0000 mg | ORAL_TABLET | Freq: Three times a day (TID) | ORAL | 0 refills | Status: AC

## 2020-01-06 MED ORDER — ROSUVASTATIN CALCIUM 20 MG PO TABS
20.0000 mg | ORAL_TABLET | Freq: Every day | ORAL | Status: DC
  Administered 2020-01-06 – 2020-01-08 (×3): 20 mg via ORAL
  Filled 2020-01-06 (×3): qty 1

## 2020-01-06 MED ORDER — TRIAMTERENE-HCTZ 37.5-25 MG PO TABS
1.0000 | ORAL_TABLET | Freq: Every day | ORAL | Status: DC
  Administered 2020-01-07 – 2020-01-09 (×3): 1 via ORAL
  Filled 2020-01-06 (×4): qty 1

## 2020-01-06 MED ORDER — DOXEPIN HCL 50 MG PO CAPS
50.0000 mg | ORAL_CAPSULE | Freq: Every day | ORAL | Status: DC
  Administered 2020-01-06 – 2020-01-08 (×3): 50 mg via ORAL
  Filled 2020-01-06 (×4): qty 1

## 2020-01-06 MED ORDER — METOPROLOL TARTRATE 25 MG/10 ML ORAL SUSPENSION
12.5000 mg | Freq: Two times a day (BID) | ORAL | Status: DC
  Administered 2020-01-06 – 2020-01-09 (×5): 12.5 mg via ORAL
  Filled 2020-01-06 (×8): qty 5

## 2020-01-06 MED ORDER — ASPIRIN 81 MG PO CHEW: 81.0000 mg | CHEWABLE_TABLET | Freq: Every day | ORAL | 0 refills | Status: DC

## 2020-01-06 MED ORDER — CLONIDINE HCL 0.2 MG PO TABS
0.2000 mg | ORAL_TABLET | Freq: Three times a day (TID) | ORAL | Status: DC
  Administered 2020-01-06 – 2020-01-09 (×8): 0.2 mg via ORAL
  Filled 2020-01-06 (×9): qty 1

## 2020-01-06 MED ORDER — BENZTROPINE MESYLATE 1 MG PO TABS
0.5000 mg | ORAL_TABLET | Freq: Every day | ORAL | Status: DC
  Administered 2020-01-07 – 2020-01-09 (×3): 0.5 mg via ORAL
  Filled 2020-01-06 (×4): qty 1

## 2020-01-06 NOTE — Discharge Summary (Addendum)
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 results for input(s): HGBA1C in the last 72 hours. Lipid Profile No results for input(s): CHOL, HDL, LDLCALC, TRIG, CHOLHDL, LDLDIRECT in the last 72 hours. Thyroid function studies No results for input(s): TSH, T4TOTAL, T3FREE, THYROIDAB in the last 72 hours.  Invalid input(s): FREET3 Anemia work up No results for input(s): VITAMINB12, FOLATE, FERRITIN, TIBC, IRON, RETICCTPCT in the last 72 hours. Urinalysis    Component Value Date/Time   COLORURINE YELLOW 11/26/2019 1522   APPEARANCEUR CLEAR 11/26/2019 1522   LABSPEC 1.011 11/26/2019 1522   PHURINE 8.0 11/26/2019 1522   GLUCOSEU NEGATIVE 11/26/2019 1522   HGBUR NEGATIVE 11/26/2019 1522   BILIRUBINUR NEGATIVE 11/26/2019 1522   KETONESUR NEGATIVE 11/26/2019 1522   PROTEINUR 30 (A) 11/26/2019 1522   UROBILINOGEN 1.0 06/01/2015 0104   NITRITE NEGATIVE 11/26/2019 1522   LEUKOCYTESUR NEGATIVE 11/26/2019 1522     Microbiology Recent Results (from the past 240 hour(s))  SARS CORONAVIRUS 2 (TAT 6-24 HRS) Nasopharyngeal Nasopharyngeal Swab     Status: None   Collection Time: 01/04/20  4:03 PM   Specimen: Nasopharyngeal Swab  Result Value Ref Range Status   SARS Coronavirus 2 NEGATIVE NEGATIVE Final    Comment: (NOTE) SARS-CoV-2 target nucleic acids are NOT DETECTED. The SARS-CoV-2 RNA is generally detectable in upper and lower respiratory  specimens during the acute phase of infection. Negative results do not preclude SARS-CoV-2 infection, do not rule out co-infections with other pathogens, and should not be used as the sole basis for treatment or other patient management decisions. Negative results must be combined with clinical observations, patient history, and epidemiological information. The expected result is Negative. Fact Sheet for Patients: SugarRoll.be Fact Sheet for Healthcare Providers: https://www.woods-mathews.com/ This test is not yet approved or cleared by the [place] FDA and  has been authorized for detection and/or diagnosis of SARS-CoV-2 by FDA under an Emergency Use Authorization (EUA). This EUA will remain  in effect (meaning this test can be used) for the duration of the COVID-19 declaration under Section 56 4(b)(1) of the Act, 21 U.S.C. section 360bbb-3(b)(1), unless the authorization is terminated or revoked sooner. Performed at [facility], [place] [address], [place], [place] [address]   Respiratory Panel by RT PCR (Flu A&B, Covid) - Nasopharyngeal Swab     Status: None   Collection Time: 01/08/20 11:00 AM   Specimen: Nasopharyngeal Swab  Result Value Ref Range Status   SARS Coronavirus 2 by RT PCR NEGATIVE NEGATIVE Final    Comment: (NOTE) SARS-CoV-2 target nucleic acids are NOT DETECTED. The SARS-CoV-2 RNA is generally detectable in upper respiratoy specimens during the acute phase of infection. The  lowest concentration of SARS-CoV-2 viral copies this assay can detect is 131 copies/mL. A negative result does not preclude SARS-Cov-2 infection and should not be used as the sole basis for treatment or other patient management decisions. A negative result may occur with  improper specimen collection/handling, submission of specimen other than nasopharyngeal swab, presence of viral mutation(s) within the areas targeted by this assay, and inadequate number of viral copies (<131 copies/mL). A negative result must be combined with clinical observations, patient history, and epidemiological information. The expected result is Negative. Fact Sheet for Patients:  PinkCheek.be Fact Sheet for Healthcare Providers:  GravelBags.it This test is not yet ap proved or cleared by the [place] FDA and  has been authorized for detection and/or diagnosis of SARS-CoV-2 by FDA under an Emergency Use Authorization (EUA). This EUA will remain  in effect (meaning this test can be used) for the duration of the COVID-19 declaration under Section 564(b)(1) of the Act, 21 U.S.C. section 360bbb-3(b)(1), unless the authorization is terminated or revoked sooner.    Influenza A by PCR NEGATIVE NEGATIVE Final   Influenza B by PCR NEGATIVE NEGATIVE Final    Comment: (NOTE) The Xpert Xpress SARS-CoV-2/FLU/RSV assay is intended as an aid in  the diagnosis of influenza from Nasopharyngeal swab specimens and  should not be used as a sole basis for treatment. Nasal washings and  aspirates are unacceptable for Xpert Xpress SARS-CoV-2/FLU/RSV  testing. Fact Sheet for Patients: PinkCheek.be Fact Sheet for Healthcare Providers: GravelBags.it This test is not yet approved or cleared by the [place] FDA and  has been authorized for detection and/or diagnosis of SARS-CoV-2 by  FDA under an Emergency  Use Authorization (EUA). This EUA will remain  in effect (meaning this test can be used) for the duration of the  Covid-19 declaration under Section 564(b)(1) of the Act, 21  U.S.C. section 360bbb-3(b)(1), unless the authorization is  terminated or revoked. Performed at [facility], [place] [address], [place], [place] [address]      Dessa Phi, DO [facility] 01/09/2020, 2:06 PM

## 2020-01-06 NOTE — Progress Notes (Signed)
Occupational Therapy Treatment Patient Details Name: Susan [name] MRN: [id number] DOB: 1960/09/25 Today's Date: 01/06/2020    History of present illness 59 year old female smoker admitted 3/10 with overdose complicated by aspiration PNA with MRSA,  Pneumococcal bacteremia, toxic metabolic encephalopathy and septic shock.  Frequent falls at home,Tobacco abuse, diabetes with painful polyneuropathy, bipolar disease, GERD, HIV, schizophrenia, history of IV drug abuse, seizure disorder. Intubated 3/10-3/29 3/23 MRI brain >>Acute nonhemorrhagic infarcts in the posterior MCA territory bilaterally. Tracheostomy placed 3/29.  Decannulated 4/28 and weaned to room air.   OT comments  Pt continues to demonstrate improved functional use of L hand to don socks, wash her face and hands and open items on her food tray. Pt now able to be up in chair without restraints and is much calmer. Cognition continues to be a limiting factor in safety and progress.   Follow Up Recommendations  SNF;Supervision/Assistance - 24 hour    Equipment Recommendations  None recommended by OT(defer to next venue)    Recommendations for Other Services      Precautions / Restrictions Precautions Precautions: Fall Precaution Comments: PEG, also eats       Mobility Bed Mobility               General bed mobility comments: up OOB in recliner  Transfers                      Balance Overall balance assessment: Needs assistance   Sitting balance-Leahy Scale: Good Sitting balance - Comments: no LOB with donning socks                                   ADL either performed or assessed with clinical judgement   ADL Overall ADL's : Needs assistance/impaired Eating/Feeding: Set up;Sitting Eating/Feeding Details (indicate cue type and reason): cues to use eating utensils Grooming: Wash/dry hands;Wash/dry face;Sitting;Minimal assistance Grooming Details (indicate cue type and reason): verbal  cues for thoroughness             Lower Body Dressing: Minimal assistance;Sitting/lateral leans Lower Body Dressing Details (indicate cue type and reason): donned socks                      Vision       Perception     Praxis      Cognition Arousal/Alertness: Awake/alert Behavior During Therapy: Impulsive Overall Cognitive Status: Impaired/Different from baseline Area of Impairment: Attention;Memory;Following commands;Safety/judgement;Awareness;Problem solving;Orientation                 Orientation Level: Disoriented to;Place;Time;Situation Current Attention Level: Focused Memory: Decreased short-term memory;Decreased recall of precautions Following Commands: Follows one step commands consistently;Follows one step commands with increased time Safety/Judgement: Decreased awareness of safety;Decreased awareness of deficits Awareness: Intellectual Problem Solving: Slow processing;Requires verbal cues;Requires tactile cues;Difficulty sequencing          Exercises     Shoulder Instructions       General Comments      Pertinent Vitals/ Pain       Pain Assessment: No/denies pain  Home Living                                          Prior Functioning/Environment  Frequency  Min 2X/week        Progress Toward Goals  OT Goals(current goals can now be found in the care plan section)  Progress towards OT goals: Progressing toward goals  Acute Rehab OT Goals Patient Stated Goal: to go home OT Goal Formulation: With patient Time For Goal Achievement: 01/20/20 Potential to Achieve Goals: Good  Plan Discharge plan remains appropriate    Co-evaluation                 AM-PAC OT "6 Clicks" Daily Activity     Outcome Measure   Help from another person eating meals?: A Little Help from another person taking care of personal grooming?: A Little Help from another person toileting, which includes using  toliet, bedpan, or urinal?: Total Help from another person bathing (including washing, rinsing, drying)?: A Lot Help from another person to put on and taking off regular upper body clothing?: A Lot Help from another person to put on and taking off regular lower body clothing?: A Little 6 Click Score: 14    End of Session    OT Visit Diagnosis: Other symptoms and signs involving cognitive function;Muscle weakness (generalized) (M62.81);Unsteadiness on feet (R26.81);Other abnormalities of gait and mobility (R26.89) Hemiplegia - Right/Left: Left Hemiplegia - dominant/non-dominant: Non-Dominant Hemiplegia - caused by: Cerebral infarction   Activity Tolerance Patient tolerated treatment well   Patient Left in chair;with call bell/phone within reach;with chair alarm set   Nurse Communication          [id number] OT Time Calculation (min): 13 min  Charges: OT General Charges $OT Visit: 1 Visit OT Treatments $Self Care/Home Management : 8-22 mins  Nestor Lewandowsky, OTR/L Acute Rehabilitation Services Pager: [phone] Office: [phone]   Malka So 01/06/2020, 9:18 AM

## 2020-01-06 NOTE — Progress Notes (Signed)
CRITICAL VALUE ALERT  Critical Value:  CBG 405  Date & Time Notied:  01/06/2020 1245  Provider Notified: MD Gala Lewandowsky  Orders Received/Actions taken: To administer 20 units of Novolog.

## 2020-01-06 NOTE — Progress Notes (Signed)
  PROGRESS NOTE  Patient seen and examined. She is sitting in chair, pleasantly conversant, ate breakfast.  Vital signs remains stable.  She voices no concerns. Ready to dc to SNF. See discharge summary by Dr. Bonner Puna.    Dessa Phi, DO [facility] 01/06/2020, 10:02 AM  Available via Epic secure chat 7am-7pm After these hours, please refer to coverage provider listed on [email or web address]

## 2020-01-07 DIAGNOSIS — R531 Weakness: Secondary | ICD-10-CM

## 2020-01-07 LAB — GLUCOSE, CAPILLARY
Glucose-Capillary: 238 mg/dL — ABNORMAL HIGH (ref 70–99)
Glucose-Capillary: 264 mg/dL — ABNORMAL HIGH (ref 70–99)
Glucose-Capillary: 136 mg/dL — ABNORMAL HIGH (ref 70–99)
Glucose-Capillary: 228 mg/dL — ABNORMAL HIGH (ref 70–99)

## 2020-01-07 MED ORDER — INSULIN ASPART 100 UNIT/ML ~~LOC~~ SOLN
3.0000 [IU] | Freq: Three times a day (TID) | SUBCUTANEOUS | Status: DC
  Administered 2020-01-07 – 2020-01-09 (×6): 3 [IU] via SUBCUTANEOUS

## 2020-01-07 NOTE — Progress Notes (Addendum)
Inpatient Diabetes Program Recommendations  AACE/ADA: New Consensus Statement on Inpatient Glycemic Control (2015)  Target Ranges:  Prepandial:   less than 140 mg/dL      Peak postprandial:   less than 180 mg/dL (1-2 hours)      Critically ill patients:  140 - 180 mg/dL   Lab Results  Component Value Date   GLUCAP 238 (H) 01/07/2020   HGBA1C 12.3 (H) 11/11/2019    Review of Glycemic Control  Results for Susan [name], Susan [name] (MRN [id number]) as of 01/07/2020 10:36  Ref. Range 01/06/2020 08:00 01/06/2020 12:15 01/06/2020 15:54 01/06/2020 19:31 01/07/2020 08:00  Glucose-Capillary Latest Ref Range: 70 - 99 mg/dL 185 (H) 405 (H) 244 (H) 116 (H) 238 (H)    Inpatient Diabetes Program Recommendations:  Novolog 3 units TID with meals or supplements if CBG >80 mg/dl and eats at least 50%  Thank you, Reche Dixon, RN, BSN Diabetes Coordinator Inpatient Diabetes Program [phone] (team pager from 8a-5p)

## 2020-01-07 NOTE — Progress Notes (Signed)
  PROGRESS NOTE  Patient resting comfortably. No change. Increased novolog mealtime per diabetes coordinator recommendations. Medically stable for SNF discharge.   Dessa Phi, DO [facility] 01/07/2020, 12:40 PM  Available via Epic secure chat 7am-7pm After these hours, please refer to coverage provider listed on [email or web address]

## 2020-01-07 NOTE — Progress Notes (Signed)
Patient ID: Susan [name], female   DOB: [birth year], 59 y.o.   MRN: [id number]  This NP visited patient at the bedside as a follow up for palliative medicine  needs and emotional support, and to meet with daughter at bedside as scheduled.for continued conversation and education  regarding current medical situation; GOCs anticipatory care needs disposition options.  Daughter/[name] is pleasantly surprised and happy with her mother's progress .  Ms Blodgett is OOB to the chair, eating her lunch, talkative and smiling with myself and her daughter.   Speech and thought process is disjointed. Daughter reports this is her baseline.  Education offered regarding the limited transition of care options. Daughter understands that SNF is being pursued but bed offers have been limited.  Daughter would ultimately like her mother to return home, but at this time believes she needs more rehabilitation.  Education offered regarding the patient's PEG tube, risks, benefits and utilization of into the future.   Currently patient is tolerating recommended diet.  Education regarding the importance of advanced care planning, Again encouraged family to consider DNR/DNI status understanding evidenced based poor outcomes in similar hospitalized patients.    Discussed with daughter the importance of continued conversation with her family and the  medical providers regarding overall plan of care and treatment options,  ensuring decisions are within the context of the patients values and GOCs.  Questions and concerns addressed   Discussed with [facility]  Total time spent on the unit was 35 minutes    Greater than 50% of the time was spent in counseling and coordination of care  Wadie Lessen NP  Palliative Medicine Team  [phone]

## 2020-01-08 LAB — GLUCOSE, CAPILLARY
Glucose-Capillary: 154 mg/dL — ABNORMAL HIGH (ref 70–99)
Glucose-Capillary: 269 mg/dL — ABNORMAL HIGH (ref 70–99)
Glucose-Capillary: 199 mg/dL — ABNORMAL HIGH (ref 70–99)
Glucose-Capillary: 217 mg/dL — ABNORMAL HIGH (ref 70–99)

## 2020-01-08 LAB — RESPIRATORY PANEL BY RT PCR (FLU A&B, COVID)
Influenza A by PCR: NEGATIVE
Influenza B by PCR: NEGATIVE
SARS Coronavirus 2 by RT PCR: NEGATIVE

## 2020-01-08 NOTE — Progress Notes (Signed)
  PROGRESS NOTE  Patient sitting in chair, eating breakfast. No acute events. Bed offer from SNF. Repeat COVID test ordered. Medically stable for SNF discharge. See updated DC summary.   Dessa Phi, DO [facility] 01/08/2020, 10:22 AM  Available via Epic secure chat 7am-7pm After these hours, please refer to coverage provider listed on [email or web address]

## 2020-01-08 NOTE — Progress Notes (Signed)
Physical Therapy Treatment Patient Details Name: Susan [name] MRN: [id number] DOB: Feb 04, 1961 Today's Date: 01/08/2020    History of Present Illness 59 year old female smoker admitted 3/10 with overdose complicated by aspiration PNA with MRSA,  Pneumococcal bacteremia, toxic metabolic encephalopathy and septic shock.  Frequent falls at home,Tobacco abuse, diabetes with painful polyneuropathy, bipolar disease, GERD, HIV, schizophrenia, history of IV drug abuse, seizure disorder. Intubated 3/10-3/29 3/23 MRI brain >>Acute nonhemorrhagic infarcts in the posterior MCA territory bilaterally. Tracheostomy placed 3/29.  Decannulated 4/28 and weaned to room air.    PT Comments    Pt very eager to mobilize with PT today. Pt with improved tolerance for ambulation today, but requires repeated seated rest breaks in hallway to recover fatigue. Pt continues to demonstrate apraxia with basic tasks, this session struggling with donning/doffing mask and operating rollator. Pt requires repeated short cuing with eye contact in order to follow commands when apraxic. Overall, mobility-wise pt is progressing well. PT to continue to follow acutely, continuing to recommend SNF level of care post-acutely.    Follow Up Recommendations  SNF     Equipment Recommendations  None recommended by PT    Recommendations for Other Services       Precautions / Restrictions Precautions Precautions: Fall Precaution Comments: PEG, also eats Restrictions Weight Bearing Restrictions: No    Mobility  Bed Mobility Overal bed mobility: Needs Assistance             General bed mobility comments: OOB in recliner  Transfers Overall transfer level: Needs assistance Equipment used: 4-wheeled walker Transfers: Sit to/from Stand Sit to Stand: Min guard Stand pivot transfers: Min assist       General transfer comment: Min guard for safety, verbal cuing for sequencing with standing (locking rollator breaks, pushing up  from support surface with UEs). Sit to stand and stand pivot x3, in hallway after seated rest breaks. Min assist for stand pivot for steadying via HHA and hand placement on rollator once turned.  Ambulation/Gait Ambulation/Gait assistance: Min assist Gait Distance (Feet): [phone]) Assistive device: 4-wheeled walker Gait Pattern/deviations: Decreased stride length;Step-through pattern;Trunk flexed;Drifts right/left Gait velocity: decr   General Gait Details: Min assist to steady, guide pt and rollator trajectory. Verbal cuing for proximity to rollator, staying on task as pt stops abruptly to look at hallway objects or talk to nurses, and upright posture as pt with excessive lumbar extension when standing and resting.   Stairs             Wheelchair Mobility    Modified Rankin (Stroke Patients Only) Modified Rankin (Stroke Patients Only) Pre-Morbid Rankin Score: No symptoms Modified Rankin: Moderately severe disability     Balance Overall balance assessment: Needs assistance   Sitting balance-Leahy Scale: Fair       Standing balance-Leahy Scale: Poor Standing balance comment: reliant on external assist, unsteady requiring PT assist                            Cognition Arousal/Alertness: Awake/alert Behavior During Therapy: Impulsive Overall Cognitive Status: Impaired/Different from baseline Area of Impairment: Attention;Memory;Following commands;Safety/judgement;Awareness;Problem solving;Orientation                 Orientation Level: Disoriented to;Time;Situation Current Attention Level: Focused Memory: Decreased short-term memory;Decreased recall of precautions Following Commands: Follows one step commands consistently;Follows one step commands with increased time Safety/Judgement: Decreased awareness of safety;Decreased awareness of deficits Awareness: Intellectual Problem Solving: Slow processing;Requires verbal cues;Requires tactile  cues;Difficulty sequencing General Comments: apraxia demonstrated with sequencing locking and sitting down on rollator (kept locking and unlocking break, could not progress), putting on mask (placing it over her head and broke straps).      Exercises      General Comments        Pertinent Vitals/Pain Pain Assessment: Faces Faces Pain Scale: Hurts a little bit Pain Location: L shoulder - "I think I slept wrong" Pain Descriptors / Indicators: Discomfort Pain Intervention(s): Limited activity within patient's tolerance;Monitored during session    Home Living                      Prior Function            PT Goals (current goals can now be found in the care plan section) Acute Rehab PT Goals Patient Stated Goal: to go home PT Goal Formulation: With patient Time For Goal Achievement: 01/12/20 Potential to Achieve Goals: Fair Progress towards PT goals: Progressing toward goals    Frequency    Min 3X/week      PT Plan Current plan remains appropriate    Co-evaluation              AM-PAC PT "6 Clicks" Mobility   Outcome Measure  Help needed turning from your back to your side while in a flat bed without using bedrails?: A Little Help needed moving from lying on your back to sitting on the side of a flat bed without using bedrails?: A Little Help needed moving to and from a bed to a chair (including a wheelchair)?: A Little Help needed standing up from a chair using your arms (e.g., wheelchair or bedside chair)?: A Little Help needed to walk in hospital room?: A Little Help needed climbing 3-5 steps with a railing? : A Lot 6 Click Score: 17    End of Session Equipment Utilized During Treatment: Gait belt Activity Tolerance: Patient limited by fatigue Patient left: in chair;with call bell/phone within reach;with chair alarm set Nurse Communication: Mobility status PT Visit Diagnosis: Other abnormalities of gait and mobility (R26.89);Other symptoms and  signs involving the nervous system (R29.898);Muscle weakness (generalized) (M62.81) Hemiplegia - Right/Left: Right Hemiplegia - dominant/non-dominant: Non-dominant Hemiplegia - caused by: Cerebral infarction     Time: 1142-1207 PT Time Calculation (min) (ACUTE ONLY): 25 min  Charges:  $Gait Training: 8-22 mins $Therapeutic Activity: 8-22 mins                    Nathanie Ottley E, PT Acute Rehabilitation Services Pager [phone]  Office [phone]    Reshma Hoey D Elonda Husky 01/08/2020, 12:36 PM

## 2020-01-09 DIAGNOSIS — I5022 Chronic systolic (congestive) heart failure: Secondary | ICD-10-CM | POA: Diagnosis not present

## 2020-01-09 DIAGNOSIS — R4702 Dysphasia: Secondary | ICD-10-CM | POA: Diagnosis not present

## 2020-01-09 DIAGNOSIS — E785 Hyperlipidemia, unspecified: Secondary | ICD-10-CM | POA: Diagnosis not present

## 2020-01-09 DIAGNOSIS — E1165 Type 2 diabetes mellitus with hyperglycemia: Secondary | ICD-10-CM | POA: Diagnosis not present

## 2020-01-09 DIAGNOSIS — M2550 Pain in unspecified joint: Secondary | ICD-10-CM | POA: Diagnosis not present

## 2020-01-09 DIAGNOSIS — I119 Hypertensive heart disease without heart failure: Secondary | ICD-10-CM | POA: Diagnosis not present

## 2020-01-09 DIAGNOSIS — F17200 Nicotine dependence, unspecified, uncomplicated: Secondary | ICD-10-CM | POA: Diagnosis not present

## 2020-01-09 DIAGNOSIS — Z931 Gastrostomy status: Secondary | ICD-10-CM | POA: Diagnosis not present

## 2020-01-09 DIAGNOSIS — M1990 Unspecified osteoarthritis, unspecified site: Secondary | ICD-10-CM | POA: Diagnosis not present

## 2020-01-09 DIAGNOSIS — I69921 Dysphasia following unspecified cerebrovascular disease: Secondary | ICD-10-CM | POA: Diagnosis not present

## 2020-01-09 DIAGNOSIS — R7881 Bacteremia: Secondary | ICD-10-CM | POA: Diagnosis not present

## 2020-01-09 DIAGNOSIS — J449 Chronic obstructive pulmonary disease, unspecified: Secondary | ICD-10-CM | POA: Diagnosis not present

## 2020-01-09 DIAGNOSIS — K219 Gastro-esophageal reflux disease without esophagitis: Secondary | ICD-10-CM | POA: Diagnosis not present

## 2020-01-09 DIAGNOSIS — I6389 Other cerebral infarction: Secondary | ICD-10-CM | POA: Diagnosis not present

## 2020-01-09 DIAGNOSIS — J189 Pneumonia, unspecified organism: Secondary | ICD-10-CM | POA: Diagnosis not present

## 2020-01-09 DIAGNOSIS — R1319 Other dysphagia: Secondary | ICD-10-CM | POA: Diagnosis not present

## 2020-01-09 DIAGNOSIS — E119 Type 2 diabetes mellitus without complications: Secondary | ICD-10-CM | POA: Diagnosis not present

## 2020-01-09 DIAGNOSIS — Z743 Need for continuous supervision: Secondary | ICD-10-CM | POA: Diagnosis not present

## 2020-01-09 DIAGNOSIS — I63413 Cerebral infarction due to embolism of bilateral middle cerebral arteries: Secondary | ICD-10-CM | POA: Diagnosis not present

## 2020-01-09 DIAGNOSIS — B9562 Methicillin resistant Staphylococcus aureus infection as the cause of diseases classified elsewhere: Secondary | ICD-10-CM | POA: Diagnosis not present

## 2020-01-09 DIAGNOSIS — R410 Disorientation, unspecified: Secondary | ICD-10-CM | POA: Diagnosis not present

## 2020-01-09 DIAGNOSIS — Z7401 Bed confinement status: Secondary | ICD-10-CM | POA: Diagnosis not present

## 2020-01-09 DIAGNOSIS — J690 Pneumonitis due to inhalation of food and vomit: Secondary | ICD-10-CM | POA: Diagnosis not present

## 2020-01-09 LAB — GLUCOSE, CAPILLARY: Glucose-Capillary: 254 mg/dL — ABNORMAL HIGH (ref 70–99)

## 2020-01-09 NOTE — TOC Progression Note (Signed)
Transition of Care [facility]) - Progression Note    Patient Details  Name: Susan [name] MRN: [id number] Date of Birth: 23-Jan-1961  Transition of Care [facility]) CM/SW [place], [place] Phone Number: 01/09/2020, 12:13 PM  Clinical Narrative:    CSW contacted patient's daughter to be updated on the discharge plan. CSW informed [facility] was the chosen facility.   CSW contacted [employer] to inquire on insurance authorization. Insurance authorized. Reference [id number], good 5/7 thru 5/11.  CSW contacted [facility] to determine bed availability. Left voicemail, awaiting a callback.   Expected Discharge Plan: [place] Barriers to Discharge: No Barriers Identified  Expected Discharge Plan and Services Expected Discharge Plan: [place] In-house Referral: Clinical Social Work     Living arrangements for the past 2 months: Single Family Home Expected Discharge Date: 01/08/20                                     Social Determinants of Health (SDOH) Interventions    Readmission Risk Interventions Readmission Risk Prevention Plan 02/19/2019  Transportation Screening Complete  Medication Review Press photographer) Complete  PCP or Specialist appointment within 3-5 days of discharge Complete  HRI or [place] Complete  SW Recovery Care/Counseling Consult Complete  [place] Not Applicable  Some recent data might be hidden

## 2020-01-09 NOTE — Progress Notes (Signed)
  PROGRESS NOTE  Patient remains stable for discharge to SNF. Repeat COVID negative.   Dessa Phi, DO [facility] 01/09/2020, 10:25 AM  Available via Epic secure chat 7am-7pm After these hours, please refer to coverage provider listed on [email or web address]

## 2020-01-09 NOTE — TOC Transition Note (Signed)
Transition of Care [facility]) - CM/SW Discharge Note   Patient Details  Name: Susan [name] MRN: [id number] Date of Birth: 16-Nov-1960  Transition of Care [facility]) CM/SW Contact:  Jacquelynn Cree Phone Number: 01/09/2020, 2:14 PM   Clinical Narrative:    Patient will DC to: [facility] Anticipated DC date: 01/09/2020 Family notified: Thayer Headings, daughter Transport by: Corey Harold   Per MD patient ready for DC to [facility]. RN, patient, patient's family, and facility notified of DC. Discharge Summary and FL2 sent to facility. RN to call report prior to discharge [phone]). DC packet on chart. Ambulance transport requested for patient.   CSW will sign off for now as social work intervention is no longer needed. Please consult [place] again if new needs arise.    Final next level of care: Skilled Nursing Facility Barriers to Discharge: No Barriers Identified   Patient Goals and CMS Choice   CMS Medicare.gov Compare Post Acute Care list provided to:: Patient Represenative (must comment)(Aquinna) Choice offered to / list presented to : Adult Children  Discharge Placement                       Discharge Plan and Services In-house Referral: Clinical Social Work                                   Social Determinants of Health (SDOH) Interventions     Readmission Risk Interventions Readmission Risk Prevention Plan 02/19/2019  Transportation Screening Complete  Medication Review Press photographer) Complete  PCP or Specialist appointment within 3-5 days of discharge Complete  HRI or [place] Complete  SW Recovery Care/Counseling Consult Complete  [place] Not Applicable  Some recent data might be hidden

## 2020-01-11 ENCOUNTER — Telehealth: Payer: Self-pay | Admitting: *Deleted

## 2020-01-11 DIAGNOSIS — B9562 Methicillin resistant Staphylococcus aureus infection as the cause of diseases classified elsewhere: Secondary | ICD-10-CM | POA: Diagnosis not present

## 2020-01-11 DIAGNOSIS — R7881 Bacteremia: Secondary | ICD-10-CM | POA: Diagnosis not present

## 2020-01-11 DIAGNOSIS — J690 Pneumonitis due to inhalation of food and vomit: Secondary | ICD-10-CM | POA: Diagnosis not present

## 2020-01-11 DIAGNOSIS — R1319 Other dysphagia: Secondary | ICD-10-CM | POA: Diagnosis not present

## 2020-01-11 LAB — GLUCOSE, CAPILLARY: Glucose-Capillary: 253 mg/dL — ABNORMAL HIGH (ref 70–99)

## 2020-01-11 NOTE — Telephone Encounter (Signed)
Pt was on TCM report admitted 3/10 to ICU with overdose in shock with aspiration pneumonia with MRSA, pneumococcal bacteremia, toxic metabolic encephalopathy, septic shock and acute versus subacute bilateral MCA infarcts. Patient was intubated on 3/10, difficult to wean off, tracheostomy placed on 3/29. Was treated for MRSA pneumonia and massive volume overload with lasix infusion. Decreased movement and mentation was noted and head CT on 3/22 showed bilateral acute vs. subacute MCA infarcts confirmed on MRI 3/23. [facility] hospitalist assumed care on 12/06/2019. Disposition is complicated by persistent encephalopathy requiring restraints to protect life-sustaining medical devices including trach and PEG. Psychiatry has been consulted for assistance with medication management. Tracheostomy decannulated 4/28, weaned to room air. Repeated SLP evaluations have demonstrated improvement in dysphagia. Tube feeds stopped 5/4.Pt D/C 01/09/20 to SNF. Per summary will need to see PCP 1-2 wks after leaving SNF.Marland KitchenJohny [name]

## 2020-01-15 DIAGNOSIS — I6389 Other cerebral infarction: Secondary | ICD-10-CM | POA: Diagnosis not present

## 2020-01-20 DIAGNOSIS — Z931 Gastrostomy status: Secondary | ICD-10-CM | POA: Diagnosis not present

## 2020-01-20 DIAGNOSIS — I69921 Dysphasia following unspecified cerebrovascular disease: Secondary | ICD-10-CM | POA: Diagnosis not present

## 2020-01-22 DIAGNOSIS — I5022 Chronic systolic (congestive) heart failure: Secondary | ICD-10-CM | POA: Diagnosis not present

## 2020-01-22 DIAGNOSIS — Z931 Gastrostomy status: Secondary | ICD-10-CM | POA: Diagnosis not present

## 2020-01-26 ENCOUNTER — Other Ambulatory Visit: Payer: Self-pay | Admitting: Internal Medicine

## 2020-01-27 DIAGNOSIS — E1165 Type 2 diabetes mellitus with hyperglycemia: Secondary | ICD-10-CM | POA: Diagnosis not present

## 2020-01-27 DIAGNOSIS — R4702 Dysphasia: Secondary | ICD-10-CM | POA: Diagnosis not present

## 2020-02-01 DIAGNOSIS — I63413 Cerebral infarction due to embolism of bilateral middle cerebral arteries: Secondary | ICD-10-CM | POA: Diagnosis not present

## 2020-02-01 DIAGNOSIS — M6281 Muscle weakness (generalized): Secondary | ICD-10-CM | POA: Diagnosis not present

## 2020-02-01 DIAGNOSIS — J9601 Acute respiratory failure with hypoxia: Secondary | ICD-10-CM | POA: Diagnosis not present

## 2020-02-03 ENCOUNTER — Other Ambulatory Visit: Payer: Self-pay

## 2020-02-03 ENCOUNTER — Encounter: Payer: Self-pay | Admitting: Internal Medicine

## 2020-02-03 ENCOUNTER — Ambulatory Visit (INDEPENDENT_AMBULATORY_CARE_PROVIDER_SITE_OTHER): Payer: Medicare Other | Admitting: Internal Medicine

## 2020-02-03 VITALS — BP 128/84 | HR 88

## 2020-02-03 DIAGNOSIS — I10 Essential (primary) hypertension: Secondary | ICD-10-CM

## 2020-02-03 DIAGNOSIS — I63413 Cerebral infarction due to embolism of bilateral middle cerebral arteries: Secondary | ICD-10-CM

## 2020-02-03 DIAGNOSIS — B20 Human immunodeficiency virus [HIV] disease: Secondary | ICD-10-CM

## 2020-02-03 NOTE — Progress Notes (Signed)
   Subjective:    Patient ID: Susan [name], female    DOB: June 20, 1961, 59 y.o.   MRN: [id number]  HPI She is here for follow up of HIV She was recently hospitalized after a drug overdose, resultant stroke and pneumonia, vented and now is in a NH.  She is improving overall.  She did remain on Biktarvy and is still taking it without issues.  She is here with a caregiver from the facility.     Review of Systems  Constitutional: Negative for fatigue.  Gastrointestinal: Negative for nausea.  Skin: Negative for rash.       Objective:   Physical Exam Constitutional:      Comments: In a wheelchair, alert  Eyes:     General: No scleral icterus. Neurological:     Comments: Some word finding difficulty  Psychiatric:        Mood and Affect: Mood normal.   SH: lives in a NH        Assessment & Plan:

## 2020-02-03 NOTE — Assessment & Plan Note (Signed)
She continues to do well with this, no issues taking the medication. rtc 6 months

## 2020-02-03 NOTE — Assessment & Plan Note (Signed)
She will continue with rehab.  I suspect her word finding issues are related to this.

## 2020-02-03 NOTE — Assessment & Plan Note (Signed)
Blood culture well controlled.

## 2020-02-04 ENCOUNTER — Inpatient Hospital Stay: Payer: Medicare Other | Admitting: Internal Medicine

## 2020-02-04 LAB — T-HELPER CELL (CD4) - (RCID CLINIC ONLY)
CD4 % Helper T Cell: 28 % — ABNORMAL LOW (ref 33–65)
CD4 T Cell Abs: 882 /uL (ref 400–1790)

## 2020-02-05 LAB — HIV-1 RNA QUANT-NO REFLEX-BLD
HIV 1 RNA Quant: <20 copies/mL — AB
HIV-1 RNA Quant, Log: <1.3 Log copies/mL — AB

## 2020-02-11 ENCOUNTER — Other Ambulatory Visit (HOSPITAL_COMMUNITY): Payer: Self-pay | Admitting: Family Medicine

## 2020-02-11 DIAGNOSIS — R633 Feeding difficulties, unspecified: Secondary | ICD-10-CM

## 2020-02-12 DIAGNOSIS — R5381 Other malaise: Secondary | ICD-10-CM | POA: Diagnosis not present

## 2020-02-12 DIAGNOSIS — I5022 Chronic systolic (congestive) heart failure: Secondary | ICD-10-CM | POA: Diagnosis not present

## 2020-02-12 DIAGNOSIS — I119 Hypertensive heart disease without heart failure: Secondary | ICD-10-CM | POA: Diagnosis not present

## 2020-02-12 DIAGNOSIS — I639 Cerebral infarction, unspecified: Secondary | ICD-10-CM | POA: Diagnosis not present

## 2020-02-15 ENCOUNTER — Ambulatory Visit (HOSPITAL_COMMUNITY)
Admission: RE | Admit: 2020-02-15 | Discharge: 2020-02-15 | Disposition: A | Discharge status: Home / Self Care | Payer: Medicare Other | Source: Ambulatory Visit | Attending: Family Medicine | Admitting: Family Medicine

## 2020-02-15 ENCOUNTER — Other Ambulatory Visit: Payer: Self-pay

## 2020-02-15 DIAGNOSIS — R1312 Dysphagia, oropharyngeal phase: Secondary | ICD-10-CM | POA: Diagnosis not present

## 2020-02-15 DIAGNOSIS — E1110 Type 2 diabetes mellitus with ketoacidosis without coma: Secondary | ICD-10-CM | POA: Diagnosis not present

## 2020-02-15 DIAGNOSIS — R296 Repeated falls: Secondary | ICD-10-CM | POA: Diagnosis not present

## 2020-02-15 DIAGNOSIS — J45901 Unspecified asthma with (acute) exacerbation: Secondary | ICD-10-CM | POA: Diagnosis not present

## 2020-02-15 DIAGNOSIS — R1310 Dysphagia, unspecified: Secondary | ICD-10-CM | POA: Diagnosis not present

## 2020-02-15 DIAGNOSIS — J189 Pneumonia, unspecified organism: Secondary | ICD-10-CM | POA: Diagnosis not present

## 2020-02-15 DIAGNOSIS — Z431 Encounter for attention to gastrostomy: Secondary | ICD-10-CM | POA: Diagnosis not present

## 2020-02-15 DIAGNOSIS — R633 Feeding difficulties, unspecified: Secondary | ICD-10-CM

## 2020-02-15 HISTORY — PX: IR GASTROSTOMY TUBE REMOVAL: IMG5492

## 2020-02-15 MED ORDER — LIDOCAINE VISCOUS HCL 2 % MT SOLN
OROMUCOSAL | Status: AC
  Filled 2020-02-15: qty 15

## 2020-02-15 NOTE — Procedures (Signed)
Per order of Dr. Twanna Hy, patient's 52 French pull-through gastrostomy tube was removed in its entirety without immediate complications.  Gauze dressing applied over site.  Medication used-viscous lidocaine into gastrostomy tube insertion site tract.  Site care instructions reviewed with patient.

## 2020-02-17 DIAGNOSIS — I110 Hypertensive heart disease with heart failure: Secondary | ICD-10-CM | POA: Diagnosis not present

## 2020-02-17 DIAGNOSIS — I69318 Other symptoms and signs involving cognitive functions following cerebral infarction: Secondary | ICD-10-CM | POA: Diagnosis not present

## 2020-02-17 DIAGNOSIS — E119 Type 2 diabetes mellitus without complications: Secondary | ICD-10-CM | POA: Diagnosis not present

## 2020-02-17 DIAGNOSIS — J449 Chronic obstructive pulmonary disease, unspecified: Secondary | ICD-10-CM | POA: Diagnosis not present

## 2020-02-17 DIAGNOSIS — I5189 Other ill-defined heart diseases: Secondary | ICD-10-CM | POA: Diagnosis not present

## 2020-02-17 DIAGNOSIS — M1990 Unspecified osteoarthritis, unspecified site: Secondary | ICD-10-CM | POA: Diagnosis not present

## 2020-02-17 DIAGNOSIS — E785 Hyperlipidemia, unspecified: Secondary | ICD-10-CM | POA: Diagnosis not present

## 2020-02-17 DIAGNOSIS — K219 Gastro-esophageal reflux disease without esophagitis: Secondary | ICD-10-CM | POA: Diagnosis not present

## 2020-02-17 DIAGNOSIS — I509 Heart failure, unspecified: Secondary | ICD-10-CM | POA: Diagnosis not present

## 2020-02-18 ENCOUNTER — Telehealth: Payer: Self-pay | Admitting: Internal Medicine

## 2020-02-18 NOTE — Telephone Encounter (Signed)
Spoke to [employer]. She stated that the SNF signed off on a face to face but she will reach back out to them to see if they can approve the orders for the pt.

## 2020-02-18 NOTE — Telephone Encounter (Signed)
New message:   Susan [name] is calling from [facility] and would like to request verbal orders for skilled nursing for 1x for 4 wks. Please advise.

## 2020-02-18 NOTE — Telephone Encounter (Signed)
We cannot sign off on orders without face to face. Either SNF doctor can sign off or she needs visit

## 2020-02-22 ENCOUNTER — Telehealth: Payer: Self-pay | Admitting: Endocrinology

## 2020-02-22 ENCOUNTER — Telehealth: Payer: Self-pay | Admitting: Internal Medicine

## 2020-02-22 ENCOUNTER — Other Ambulatory Visit: Payer: Self-pay | Admitting: Endocrinology

## 2020-02-22 ENCOUNTER — Telehealth: Payer: Self-pay | Admitting: Neurology

## 2020-02-22 ENCOUNTER — Other Ambulatory Visit: Payer: Self-pay

## 2020-02-22 DIAGNOSIS — E119 Type 2 diabetes mellitus without complications: Secondary | ICD-10-CM

## 2020-02-22 DIAGNOSIS — K219 Gastro-esophageal reflux disease without esophagitis: Secondary | ICD-10-CM | POA: Diagnosis not present

## 2020-02-22 DIAGNOSIS — J449 Chronic obstructive pulmonary disease, unspecified: Secondary | ICD-10-CM | POA: Diagnosis not present

## 2020-02-22 DIAGNOSIS — E785 Hyperlipidemia, unspecified: Secondary | ICD-10-CM | POA: Diagnosis not present

## 2020-02-22 DIAGNOSIS — M1990 Unspecified osteoarthritis, unspecified site: Secondary | ICD-10-CM | POA: Diagnosis not present

## 2020-02-22 DIAGNOSIS — I110 Hypertensive heart disease with heart failure: Secondary | ICD-10-CM | POA: Diagnosis not present

## 2020-02-22 DIAGNOSIS — I69318 Other symptoms and signs involving cognitive functions following cerebral infarction: Secondary | ICD-10-CM | POA: Diagnosis not present

## 2020-02-22 DIAGNOSIS — I509 Heart failure, unspecified: Secondary | ICD-10-CM | POA: Diagnosis not present

## 2020-02-22 MED ORDER — ACCU-CHEK AVIVA CONNECT W/DEVICE KIT: PACK | 0 refills | Status: DC

## 2020-02-22 MED ORDER — INSULIN ASPART 100 UNIT/ML ~~LOC~~ SOLN: 0.0000 [IU] | Freq: Three times a day (TID) | SUBCUTANEOUS | 1 refills | Status: DC

## 2020-02-22 MED ORDER — ACCU-CHEK AVIVA PLUS VI STRP: ORAL_STRIP | 1 refills | Status: DC

## 2020-02-22 MED ORDER — INSULIN GLARGINE 100 UNIT/ML ~~LOC~~ SOLN: 30.0000 [IU] | Freq: Every day | SUBCUTANEOUS | 1 refills | Status: DC

## 2020-02-22 NOTE — Telephone Encounter (Signed)
Susan [name] has been informed and will let the pt know.

## 2020-02-22 NOTE — Telephone Encounter (Signed)
Please review and advise.

## 2020-02-22 NOTE — Telephone Encounter (Signed)
New message:   Erlene Quan is calling with [facility] and would like verbal orders for OT for 2x a week for 6 weeks. Please advise.

## 2020-02-22 NOTE — Telephone Encounter (Signed)
I have called Susan [name] to let him know that PT/OT will have to go through her PCP.

## 2020-02-22 NOTE — Telephone Encounter (Signed)
Susan [name] from [facility] called needing VO for twice a week for 2 weeks. He states she has not seen her PCP for a while and Susan [name] is hoping that these VO can be approved. Please advise.

## 2020-02-22 NOTE — Telephone Encounter (Signed)
Insulins and a new meter: 1.  Please schedule f/u appt 2.  Then please refill x 1, pending that appt.  PT/OT: please ask PCP

## 2020-02-22 NOTE — Telephone Encounter (Signed)
Please note call came from [facility] with [facility] - he was passing on information from his visit with patient and family (after patient's stroke and arrival home from hospital)  Per Erlene Quan patients family indicated that the patient is out of insulin and needs refills and needs a new meter as hers is no longer functional.  Per Erlene Quan - he needs if possible orders from Dr Loanne Drilling for at home OT for 2x per week and for 6 weeks  Call back number [phone]

## 2020-02-22 NOTE — Telephone Encounter (Signed)
LM for patient to CB to schedule follow appt with Dr Loanne Drilling.  RX has been sent to the pharmacy.

## 2020-02-22 NOTE — Telephone Encounter (Signed)
See prior note about same. We cannot approve any home health orders as we have not seen patient.

## 2020-02-23 DIAGNOSIS — M1990 Unspecified osteoarthritis, unspecified site: Secondary | ICD-10-CM | POA: Diagnosis not present

## 2020-02-23 DIAGNOSIS — I110 Hypertensive heart disease with heart failure: Secondary | ICD-10-CM | POA: Diagnosis not present

## 2020-02-23 DIAGNOSIS — K219 Gastro-esophageal reflux disease without esophagitis: Secondary | ICD-10-CM | POA: Diagnosis not present

## 2020-02-23 DIAGNOSIS — J449 Chronic obstructive pulmonary disease, unspecified: Secondary | ICD-10-CM | POA: Diagnosis not present

## 2020-02-23 DIAGNOSIS — I509 Heart failure, unspecified: Secondary | ICD-10-CM | POA: Diagnosis not present

## 2020-02-23 DIAGNOSIS — I69318 Other symptoms and signs involving cognitive functions following cerebral infarction: Secondary | ICD-10-CM | POA: Diagnosis not present

## 2020-02-23 DIAGNOSIS — E785 Hyperlipidemia, unspecified: Secondary | ICD-10-CM | POA: Diagnosis not present

## 2020-02-23 DIAGNOSIS — E119 Type 2 diabetes mellitus without complications: Secondary | ICD-10-CM | POA: Diagnosis not present

## 2020-02-23 NOTE — Telephone Encounter (Signed)
I called Erlene Quan at  [facility] [phone] that pt is a new pt to our office and Dr.SEThi. I stated pt was not seen by him in the hospital. I stated pt has new pt appt on 03/01/2020 and needs to attend it. I stated Dr Leonie Man can do orders ongoing for therapy. I advise them to contact PCP if they need orders before her appt on 03/01/2020. Erlene Quan advise to call him back at [phone] after pt is seen by Dr.Sethi.

## 2020-02-24 ENCOUNTER — Telehealth: Payer: Self-pay | Admitting: Internal Medicine

## 2020-02-24 DIAGNOSIS — K219 Gastro-esophageal reflux disease without esophagitis: Secondary | ICD-10-CM | POA: Diagnosis not present

## 2020-02-24 DIAGNOSIS — E119 Type 2 diabetes mellitus without complications: Secondary | ICD-10-CM | POA: Diagnosis not present

## 2020-02-24 DIAGNOSIS — I110 Hypertensive heart disease with heart failure: Secondary | ICD-10-CM | POA: Diagnosis not present

## 2020-02-24 DIAGNOSIS — M1990 Unspecified osteoarthritis, unspecified site: Secondary | ICD-10-CM | POA: Diagnosis not present

## 2020-02-24 DIAGNOSIS — I509 Heart failure, unspecified: Secondary | ICD-10-CM | POA: Diagnosis not present

## 2020-02-24 DIAGNOSIS — E785 Hyperlipidemia, unspecified: Secondary | ICD-10-CM | POA: Diagnosis not present

## 2020-02-24 DIAGNOSIS — I69318 Other symptoms and signs involving cognitive functions following cerebral infarction: Secondary | ICD-10-CM | POA: Diagnosis not present

## 2020-02-24 DIAGNOSIS — J449 Chronic obstructive pulmonary disease, unspecified: Secondary | ICD-10-CM | POA: Diagnosis not present

## 2020-02-24 NOTE — Telephone Encounter (Signed)
LVM with details below 

## 2020-02-24 NOTE — Telephone Encounter (Signed)
New message:   Susan [name] is calling with [facility] to get verbal orders for PT for 1x1 and 2x3. Please advise.

## 2020-02-24 NOTE — Telephone Encounter (Signed)
No, we cannot sign off on these as we have no face to face visit to use for this. Would need to come from her SNF provider.

## 2020-02-25 ENCOUNTER — Telehealth: Payer: Self-pay

## 2020-02-25 DIAGNOSIS — I509 Heart failure, unspecified: Secondary | ICD-10-CM | POA: Diagnosis not present

## 2020-02-25 DIAGNOSIS — I110 Hypertensive heart disease with heart failure: Secondary | ICD-10-CM | POA: Diagnosis not present

## 2020-02-25 DIAGNOSIS — E119 Type 2 diabetes mellitus without complications: Secondary | ICD-10-CM | POA: Diagnosis not present

## 2020-02-25 DIAGNOSIS — E785 Hyperlipidemia, unspecified: Secondary | ICD-10-CM | POA: Diagnosis not present

## 2020-02-25 DIAGNOSIS — I69318 Other symptoms and signs involving cognitive functions following cerebral infarction: Secondary | ICD-10-CM | POA: Diagnosis not present

## 2020-02-25 DIAGNOSIS — K219 Gastro-esophageal reflux disease without esophagitis: Secondary | ICD-10-CM | POA: Diagnosis not present

## 2020-02-25 DIAGNOSIS — J449 Chronic obstructive pulmonary disease, unspecified: Secondary | ICD-10-CM | POA: Diagnosis not present

## 2020-02-25 DIAGNOSIS — M1990 Unspecified osteoarthritis, unspecified site: Secondary | ICD-10-CM | POA: Diagnosis not present

## 2020-02-25 NOTE — Telephone Encounter (Signed)
error 

## 2020-02-26 ENCOUNTER — Other Ambulatory Visit: Payer: Self-pay | Admitting: Internal Medicine

## 2020-02-29 ENCOUNTER — Telehealth: Payer: Medicare Other | Admitting: Internal Medicine

## 2020-02-29 DIAGNOSIS — I509 Heart failure, unspecified: Secondary | ICD-10-CM | POA: Diagnosis not present

## 2020-02-29 DIAGNOSIS — I110 Hypertensive heart disease with heart failure: Secondary | ICD-10-CM | POA: Diagnosis not present

## 2020-02-29 DIAGNOSIS — M1990 Unspecified osteoarthritis, unspecified site: Secondary | ICD-10-CM | POA: Diagnosis not present

## 2020-02-29 DIAGNOSIS — I69318 Other symptoms and signs involving cognitive functions following cerebral infarction: Secondary | ICD-10-CM | POA: Diagnosis not present

## 2020-02-29 DIAGNOSIS — K219 Gastro-esophageal reflux disease without esophagitis: Secondary | ICD-10-CM | POA: Diagnosis not present

## 2020-02-29 DIAGNOSIS — E119 Type 2 diabetes mellitus without complications: Secondary | ICD-10-CM | POA: Diagnosis not present

## 2020-02-29 DIAGNOSIS — J449 Chronic obstructive pulmonary disease, unspecified: Secondary | ICD-10-CM | POA: Diagnosis not present

## 2020-02-29 DIAGNOSIS — E785 Hyperlipidemia, unspecified: Secondary | ICD-10-CM | POA: Diagnosis not present

## 2020-02-29 NOTE — Progress Notes (Signed)
Patient did not show for virtual visit and was not available to contact via phone.

## 2020-03-01 ENCOUNTER — Ambulatory Visit: Payer: Self-pay | Admitting: Neurology

## 2020-03-01 ENCOUNTER — Telehealth: Payer: Self-pay

## 2020-03-01 ENCOUNTER — Encounter: Payer: Self-pay | Admitting: Neurology

## 2020-03-01 DIAGNOSIS — I69318 Other symptoms and signs involving cognitive functions following cerebral infarction: Secondary | ICD-10-CM | POA: Diagnosis not present

## 2020-03-01 DIAGNOSIS — E785 Hyperlipidemia, unspecified: Secondary | ICD-10-CM | POA: Diagnosis not present

## 2020-03-01 DIAGNOSIS — E119 Type 2 diabetes mellitus without complications: Secondary | ICD-10-CM | POA: Diagnosis not present

## 2020-03-01 DIAGNOSIS — K219 Gastro-esophageal reflux disease without esophagitis: Secondary | ICD-10-CM | POA: Diagnosis not present

## 2020-03-01 DIAGNOSIS — J449 Chronic obstructive pulmonary disease, unspecified: Secondary | ICD-10-CM | POA: Diagnosis not present

## 2020-03-01 DIAGNOSIS — I110 Hypertensive heart disease with heart failure: Secondary | ICD-10-CM | POA: Diagnosis not present

## 2020-03-01 DIAGNOSIS — M1990 Unspecified osteoarthritis, unspecified site: Secondary | ICD-10-CM | POA: Diagnosis not present

## 2020-03-01 DIAGNOSIS — I509 Heart failure, unspecified: Secondary | ICD-10-CM | POA: Diagnosis not present

## 2020-03-01 NOTE — Telephone Encounter (Signed)
Pt called office. She will not be coming to her appt today because of a family emergency. She wants to r/s her appt not this week but as soon as possible. I offered her the soonest available appt with Dr. Leonie Man which is at the end of July but she would like a sooner appt. I advised her that I will let our referrals dept and his RN know this to watch his schedule.   I advised pt that there is a no show fee for cancellations within 24 hours but that since there was a family emergency I will ask our billing dept to take this into consideration. Pt verbalized understanding.

## 2020-03-01 NOTE — Telephone Encounter (Signed)
Left vm for Erlene Quan that pt cancel her appt this am at 1030. I stated orders cannot be done per Dr Leonie Man. This is a new pt to him and the office. LEft vm if he has any questions.

## 2020-03-01 NOTE — Telephone Encounter (Signed)
This is a new pt appt. Pt was not seen by Dr.Sethi or any of our providers in the office.

## 2020-03-02 ENCOUNTER — Telehealth: Payer: Self-pay | Admitting: Internal Medicine

## 2020-03-02 DIAGNOSIS — M1990 Unspecified osteoarthritis, unspecified site: Secondary | ICD-10-CM | POA: Diagnosis not present

## 2020-03-02 DIAGNOSIS — J449 Chronic obstructive pulmonary disease, unspecified: Secondary | ICD-10-CM | POA: Diagnosis not present

## 2020-03-02 DIAGNOSIS — E785 Hyperlipidemia, unspecified: Secondary | ICD-10-CM | POA: Diagnosis not present

## 2020-03-02 DIAGNOSIS — E119 Type 2 diabetes mellitus without complications: Secondary | ICD-10-CM | POA: Diagnosis not present

## 2020-03-02 DIAGNOSIS — I509 Heart failure, unspecified: Secondary | ICD-10-CM | POA: Diagnosis not present

## 2020-03-02 DIAGNOSIS — I69318 Other symptoms and signs involving cognitive functions following cerebral infarction: Secondary | ICD-10-CM | POA: Diagnosis not present

## 2020-03-02 DIAGNOSIS — K219 Gastro-esophageal reflux disease without esophagitis: Secondary | ICD-10-CM | POA: Diagnosis not present

## 2020-03-02 DIAGNOSIS — I110 Hypertensive heart disease with heart failure: Secondary | ICD-10-CM | POA: Diagnosis not present

## 2020-03-02 NOTE — Telephone Encounter (Signed)
New message:   Eustaquio Maize is calling from [facility] and states the daughter has requested a Education officer, museum so they can discuss some other options for care for the patient. Please fax orders to [phone]. Please advise.

## 2020-03-03 DIAGNOSIS — J449 Chronic obstructive pulmonary disease, unspecified: Secondary | ICD-10-CM | POA: Diagnosis not present

## 2020-03-03 DIAGNOSIS — I509 Heart failure, unspecified: Secondary | ICD-10-CM | POA: Diagnosis not present

## 2020-03-03 DIAGNOSIS — E785 Hyperlipidemia, unspecified: Secondary | ICD-10-CM | POA: Diagnosis not present

## 2020-03-03 DIAGNOSIS — M1990 Unspecified osteoarthritis, unspecified site: Secondary | ICD-10-CM | POA: Diagnosis not present

## 2020-03-03 DIAGNOSIS — E119 Type 2 diabetes mellitus without complications: Secondary | ICD-10-CM | POA: Diagnosis not present

## 2020-03-03 DIAGNOSIS — I69318 Other symptoms and signs involving cognitive functions following cerebral infarction: Secondary | ICD-10-CM | POA: Diagnosis not present

## 2020-03-03 DIAGNOSIS — I110 Hypertensive heart disease with heart failure: Secondary | ICD-10-CM | POA: Diagnosis not present

## 2020-03-03 DIAGNOSIS — K219 Gastro-esophageal reflux disease without esophagitis: Secondary | ICD-10-CM | POA: Diagnosis not present

## 2020-03-03 NOTE — Telephone Encounter (Signed)
As previously stated in many other phone notes we cannot sign off on any home health services as we have not seen patient face to face.

## 2020-03-03 NOTE — Telephone Encounter (Signed)
Called pt, LVM.   I also reached out to her daughter, Aquinna, and LVM in regard to orders.

## 2020-03-08 ENCOUNTER — Other Ambulatory Visit (HOSPITAL_COMMUNITY): Payer: Medicare Other

## 2020-03-08 ENCOUNTER — Emergency Department (HOSPITAL_COMMUNITY): Payer: Medicare Other

## 2020-03-08 ENCOUNTER — Inpatient Hospital Stay (HOSPITAL_COMMUNITY)
Admission: EM | Admit: 2020-03-08 | Discharge: 2020-03-21 | DRG: 917 | Disposition: A | Discharge status: Home Health | Payer: Medicare Other | Attending: Internal Medicine | Admitting: Internal Medicine

## 2020-03-08 ENCOUNTER — Inpatient Hospital Stay (HOSPITAL_COMMUNITY): Payer: Medicare Other

## 2020-03-08 DIAGNOSIS — G40909 Epilepsy, unspecified, not intractable, without status epilepticus: Secondary | ICD-10-CM | POA: Diagnosis present

## 2020-03-08 DIAGNOSIS — E7800 Pure hypercholesterolemia, unspecified: Secondary | ICD-10-CM | POA: Diagnosis present

## 2020-03-08 DIAGNOSIS — I6330 Cerebral infarction due to thrombosis of unspecified cerebral artery: Secondary | ICD-10-CM | POA: Insufficient documentation

## 2020-03-08 DIAGNOSIS — E861 Hypovolemia: Secondary | ICD-10-CM | POA: Diagnosis present

## 2020-03-08 DIAGNOSIS — E11649 Type 2 diabetes mellitus with hypoglycemia without coma: Secondary | ICD-10-CM | POA: Diagnosis not present

## 2020-03-08 DIAGNOSIS — R1310 Dysphagia, unspecified: Secondary | ICD-10-CM | POA: Diagnosis present

## 2020-03-08 DIAGNOSIS — G9340 Encephalopathy, unspecified: Secondary | ICD-10-CM | POA: Diagnosis present

## 2020-03-08 DIAGNOSIS — S199XXA Unspecified injury of neck, initial encounter: Secondary | ICD-10-CM | POA: Diagnosis not present

## 2020-03-08 DIAGNOSIS — Z8673 Personal history of transient ischemic attack (TIA), and cerebral infarction without residual deficits: Secondary | ICD-10-CM | POA: Diagnosis not present

## 2020-03-08 DIAGNOSIS — N179 Acute kidney failure, unspecified: Secondary | ICD-10-CM

## 2020-03-08 DIAGNOSIS — Z7982 Long term (current) use of aspirin: Secondary | ICD-10-CM

## 2020-03-08 DIAGNOSIS — I10 Essential (primary) hypertension: Secondary | ICD-10-CM | POA: Diagnosis not present

## 2020-03-08 DIAGNOSIS — E119 Type 2 diabetes mellitus without complications: Secondary | ICD-10-CM

## 2020-03-08 DIAGNOSIS — E871 Hypo-osmolality and hyponatremia: Secondary | ICD-10-CM | POA: Diagnosis present

## 2020-03-08 DIAGNOSIS — I878 Other specified disorders of veins: Secondary | ICD-10-CM

## 2020-03-08 DIAGNOSIS — Z794 Long term (current) use of insulin: Secondary | ICD-10-CM

## 2020-03-08 DIAGNOSIS — E1140 Type 2 diabetes mellitus with diabetic neuropathy, unspecified: Secondary | ICD-10-CM | POA: Diagnosis not present

## 2020-03-08 DIAGNOSIS — I959 Hypotension, unspecified: Secondary | ICD-10-CM

## 2020-03-08 DIAGNOSIS — E1165 Type 2 diabetes mellitus with hyperglycemia: Secondary | ICD-10-CM | POA: Diagnosis not present

## 2020-03-08 DIAGNOSIS — K219 Gastro-esophageal reflux disease without esophagitis: Secondary | ICD-10-CM | POA: Diagnosis present

## 2020-03-08 DIAGNOSIS — A419 Sepsis, unspecified organism: Secondary | ICD-10-CM | POA: Diagnosis present

## 2020-03-08 DIAGNOSIS — G92 Toxic encephalopathy: Secondary | ICD-10-CM | POA: Diagnosis present

## 2020-03-08 DIAGNOSIS — R6521 Severe sepsis with septic shock: Secondary | ICD-10-CM | POA: Diagnosis present

## 2020-03-08 DIAGNOSIS — R296 Repeated falls: Secondary | ICD-10-CM | POA: Diagnosis not present

## 2020-03-08 DIAGNOSIS — R918 Other nonspecific abnormal finding of lung field: Secondary | ICD-10-CM | POA: Diagnosis not present

## 2020-03-08 DIAGNOSIS — J45901 Unspecified asthma with (acute) exacerbation: Secondary | ICD-10-CM | POA: Diagnosis not present

## 2020-03-08 DIAGNOSIS — R414 Neurologic neglect syndrome: Secondary | ICD-10-CM | POA: Diagnosis not present

## 2020-03-08 DIAGNOSIS — J690 Pneumonitis due to inhalation of food and vomit: Secondary | ICD-10-CM | POA: Diagnosis not present

## 2020-03-08 DIAGNOSIS — B20 Human immunodeficiency virus [HIV] disease: Secondary | ICD-10-CM | POA: Diagnosis present

## 2020-03-08 DIAGNOSIS — F209 Schizophrenia, unspecified: Secondary | ICD-10-CM | POA: Diagnosis present

## 2020-03-08 DIAGNOSIS — I63531 Cerebral infarction due to unspecified occlusion or stenosis of right posterior cerebral artery: Secondary | ICD-10-CM | POA: Diagnosis not present

## 2020-03-08 DIAGNOSIS — R404 Transient alteration of awareness: Secondary | ICD-10-CM | POA: Diagnosis not present

## 2020-03-08 DIAGNOSIS — F329 Major depressive disorder, single episode, unspecified: Secondary | ICD-10-CM | POA: Diagnosis present

## 2020-03-08 DIAGNOSIS — R4182 Altered mental status, unspecified: Secondary | ICD-10-CM | POA: Diagnosis present

## 2020-03-08 DIAGNOSIS — J9601 Acute respiratory failure with hypoxia: Secondary | ICD-10-CM | POA: Diagnosis present

## 2020-03-08 DIAGNOSIS — I639 Cerebral infarction, unspecified: Secondary | ICD-10-CM | POA: Diagnosis not present

## 2020-03-08 DIAGNOSIS — F250 Schizoaffective disorder, bipolar type: Secondary | ICD-10-CM | POA: Diagnosis present

## 2020-03-08 DIAGNOSIS — T40411A Poisoning by fentanyl or fentanyl analogs, accidental (unintentional), initial encounter: Secondary | ICD-10-CM | POA: Diagnosis not present

## 2020-03-08 DIAGNOSIS — Z743 Need for continuous supervision: Secondary | ICD-10-CM | POA: Diagnosis not present

## 2020-03-08 DIAGNOSIS — G8194 Hemiplegia, unspecified affecting left nondominant side: Secondary | ICD-10-CM | POA: Diagnosis present

## 2020-03-08 DIAGNOSIS — R29818 Other symptoms and signs involving the nervous system: Secondary | ICD-10-CM | POA: Diagnosis not present

## 2020-03-08 DIAGNOSIS — Z20822 Contact with and (suspected) exposure to covid-19: Secondary | ICD-10-CM | POA: Diagnosis not present

## 2020-03-08 DIAGNOSIS — Z452 Encounter for adjustment and management of vascular access device: Secondary | ICD-10-CM | POA: Diagnosis not present

## 2020-03-08 DIAGNOSIS — Z79899 Other long term (current) drug therapy: Secondary | ICD-10-CM

## 2020-03-08 DIAGNOSIS — E161 Other hypoglycemia: Secondary | ICD-10-CM | POA: Diagnosis not present

## 2020-03-08 DIAGNOSIS — F17210 Nicotine dependence, cigarettes, uncomplicated: Secondary | ICD-10-CM | POA: Diagnosis present

## 2020-03-08 DIAGNOSIS — E162 Hypoglycemia, unspecified: Secondary | ICD-10-CM | POA: Diagnosis not present

## 2020-03-08 DIAGNOSIS — E785 Hyperlipidemia, unspecified: Secondary | ICD-10-CM | POA: Diagnosis present

## 2020-03-08 DIAGNOSIS — F331 Major depressive disorder, recurrent, moderate: Secondary | ICD-10-CM | POA: Diagnosis present

## 2020-03-08 DIAGNOSIS — Z6836 Body mass index (BMI) 36.0-36.9, adult: Secondary | ICD-10-CM

## 2020-03-08 DIAGNOSIS — J189 Pneumonia, unspecified organism: Secondary | ICD-10-CM | POA: Diagnosis not present

## 2020-03-08 DIAGNOSIS — E1110 Type 2 diabetes mellitus with ketoacidosis without coma: Secondary | ICD-10-CM | POA: Diagnosis not present

## 2020-03-08 LAB — I-STAT CHEM 8, ED
BUN: 43 mg/dL — ABNORMAL HIGH (ref 6–20)
Calcium, Ion: 1.04 mmol/L — ABNORMAL LOW (ref 1.15–1.40)
Chloride: 87 mmol/L — ABNORMAL LOW (ref 98–111)
Creatinine, Ser: 3.5 mg/dL — ABNORMAL HIGH (ref 0.44–1.00)
Glucose, Bld: 184 mg/dL — ABNORMAL HIGH (ref 70–99)
HCT: 44 % (ref 36.0–46.0)
Hemoglobin: 15 g/dL (ref 12.0–15.0)
Potassium: 3.8 mmol/L (ref 3.5–5.1)
Sodium: 128 mmol/L — ABNORMAL LOW (ref 135–145)
TCO2: 31 mmol/L (ref 22–32)

## 2020-03-08 LAB — URINALYSIS, ROUTINE W REFLEX MICROSCOPIC
Bacteria, UA: NONE SEEN
Bilirubin Urine: NEGATIVE
Glucose, UA: >=500 mg/dL — AB
Hgb urine dipstick: NEGATIVE
Ketones, ur: NEGATIVE mg/dL
Leukocytes,Ua: NEGATIVE
Nitrite: NEGATIVE
Protein, ur: NEGATIVE mg/dL
Specific Gravity, Urine: 1.022 (ref 1.005–1.030)
pH: 5 (ref 5.0–8.0)

## 2020-03-08 LAB — HEMOGLOBIN A1C
Hgb A1c MFr Bld: 11.7 % — ABNORMAL HIGH (ref 4.8–5.6)
Mean Plasma Glucose: 289.09 mg/dL

## 2020-03-08 LAB — CBC WITH DIFFERENTIAL/PLATELET
Abs Immature Granulocytes: 0.05 10*3/uL (ref 0.00–0.07)
Basophils Absolute: 0 10*3/uL (ref 0.0–0.1)
Basophils Relative: 0 %
Eosinophils Absolute: 0.2 10*3/uL (ref 0.0–0.5)
Eosinophils Relative: 2 %
HCT: 44.7 % (ref 36.0–46.0)
Hemoglobin: 14.2 g/dL (ref 12.0–15.0)
Immature Granulocytes: 0 %
Lymphocytes Relative: 16 %
Lymphs Abs: 2.1 10*3/uL (ref 0.7–4.0)
MCH: 27.8 pg (ref 26.0–34.0)
MCHC: 31.8 g/dL (ref 30.0–36.0)
MCV: 87.6 fL (ref 80.0–100.0)
Monocytes Absolute: 0.7 10*3/uL (ref 0.1–1.0)
Monocytes Relative: 6 %
Neutro Abs: 10 10*3/uL — ABNORMAL HIGH (ref 1.7–7.7)
Neutrophils Relative %: 76 %
Platelets: ADEQUATE 10*3/uL (ref 150–400)
RBC: 5.1 MIL/uL (ref 3.87–5.11)
RDW: 14.2 % (ref 11.5–15.5)
WBC: 13.1 10*3/uL — ABNORMAL HIGH (ref 4.0–10.5)
nRBC: 0 % (ref 0.0–0.2)

## 2020-03-08 LAB — COMPREHENSIVE METABOLIC PANEL
ALT: 16 U/L (ref 0–44)
AST: 24 U/L (ref 15–41)
Albumin: 3.2 g/dL — ABNORMAL LOW (ref 3.5–5.0)
Alkaline Phosphatase: 99 U/L (ref 38–126)
Anion gap: 15 (ref 5–15)
BUN: 34 mg/dL — ABNORMAL HIGH (ref 6–20)
CO2: 27 mmol/L (ref 22–32)
Calcium: 9.1 mg/dL (ref 8.9–10.3)
Chloride: 88 mmol/L — ABNORMAL LOW (ref 98–111)
Creatinine, Ser: 3.4 mg/dL — ABNORMAL HIGH (ref 0.44–1.00)
GFR calc Af Amer: 16 mL/min — ABNORMAL LOW (ref >60)
GFR calc non Af Amer: 14 mL/min — ABNORMAL LOW (ref >60)
Glucose, Bld: 178 mg/dL — ABNORMAL HIGH (ref 70–99)
Potassium: 4 mmol/L (ref 3.5–5.1)
Sodium: 130 mmol/L — ABNORMAL LOW (ref 135–145)
Total Bilirubin: 0.5 mg/dL (ref 0.3–1.2)
Total Protein: 6.9 g/dL (ref 6.5–8.1)

## 2020-03-08 LAB — TROPONIN I (HIGH SENSITIVITY)
Troponin I (High Sensitivity): 8 ng/L (ref <18)
Troponin I (High Sensitivity): 4 ng/L (ref <18)

## 2020-03-08 LAB — LACTIC ACID, PLASMA
Lactic Acid, Venous: 3.8 mmol/L (ref 0.5–1.9)
Lactic Acid, Venous: 0.9 mmol/L (ref 0.5–1.9)

## 2020-03-08 LAB — RAPID URINE DRUG SCREEN, HOSP PERFORMED
Amphetamines: NOT DETECTED
Barbiturates: NOT DETECTED
Benzodiazepines: NOT DETECTED
Cocaine: NOT DETECTED
Opiates: NOT DETECTED
Tetrahydrocannabinol: NOT DETECTED

## 2020-03-08 LAB — CBG MONITORING, ED
Glucose-Capillary: 148 mg/dL — ABNORMAL HIGH (ref 70–99)
Glucose-Capillary: 191 mg/dL — ABNORMAL HIGH (ref 70–99)
Glucose-Capillary: 192 mg/dL — ABNORMAL HIGH (ref 70–99)
Glucose-Capillary: 43 mg/dL — CL (ref 70–99)
Glucose-Capillary: 120 mg/dL — ABNORMAL HIGH (ref 70–99)
Glucose-Capillary: 145 mg/dL — ABNORMAL HIGH (ref 70–99)
Glucose-Capillary: 124 mg/dL — ABNORMAL HIGH (ref 70–99)

## 2020-03-08 LAB — SARS CORONAVIRUS 2 BY RT PCR (HOSPITAL ORDER, PERFORMED IN ~~LOC~~ HOSPITAL LAB): SARS Coronavirus 2: NEGATIVE

## 2020-03-08 LAB — GLUCOSE, CAPILLARY: Glucose-Capillary: 97 mg/dL (ref 70–99)

## 2020-03-08 IMAGING — CT CT CERVICAL SPINE W/O CM
3 of 4 series · 12 of 33 positions shown, 14 images · non-contrast
Comparison: None.

CLINICAL DATA: Trauma.

EXAM:
CT CERVICAL SPINE WITHOUT CONTRAST
TECHNIQUE: Multidetector CT imaging of the cervical spine was performed without
intravenous contrast. Multiplanar CT image reconstructions were also
generated.

[Series 4: c_spine 2.0 st · axial · 0.28mm/px · z∈[-274,-128]mm · 4 of 111 slices shown, 5 images]
[im 19/111  soft-tissue]
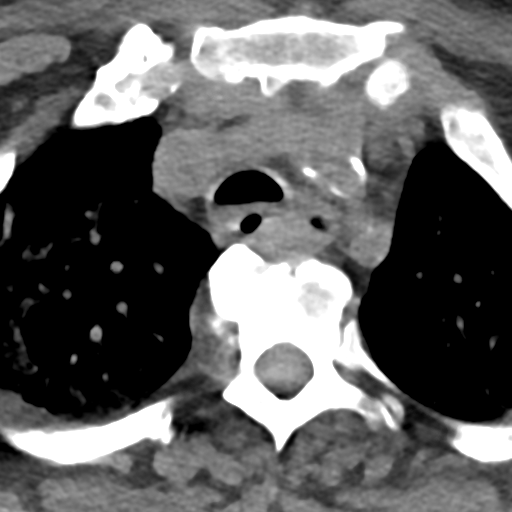
[im 19/111  bone]
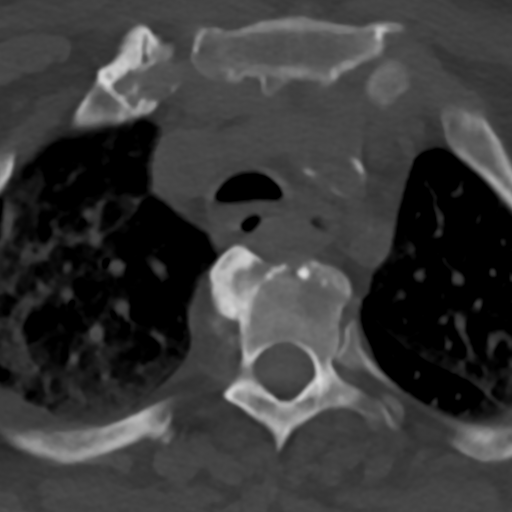
[im 37/111  bone]
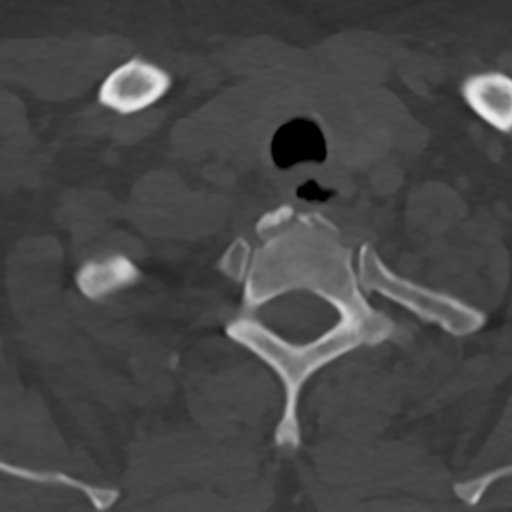
[im 74/111  bone]
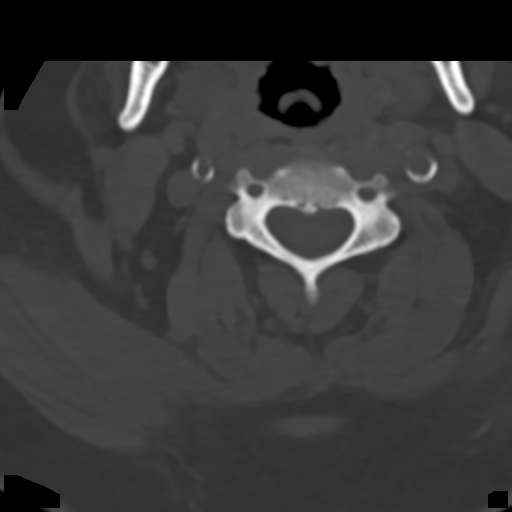
[im 92/111  bone]
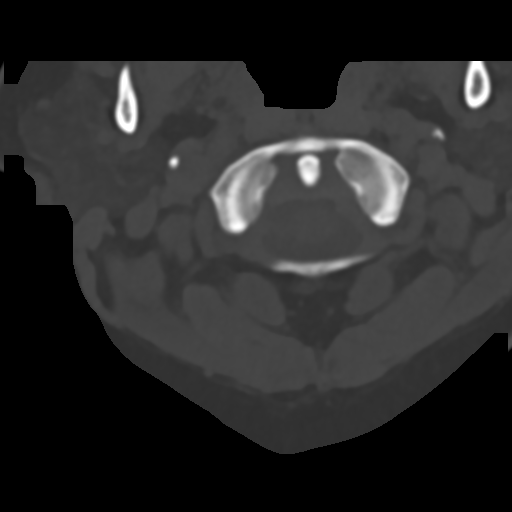

[Series 6: c_spine 2.0 sag bone · sagittal · 0.25mm/px · 5 of 61 slices shown, 6 images]
[im 21/61  bone]
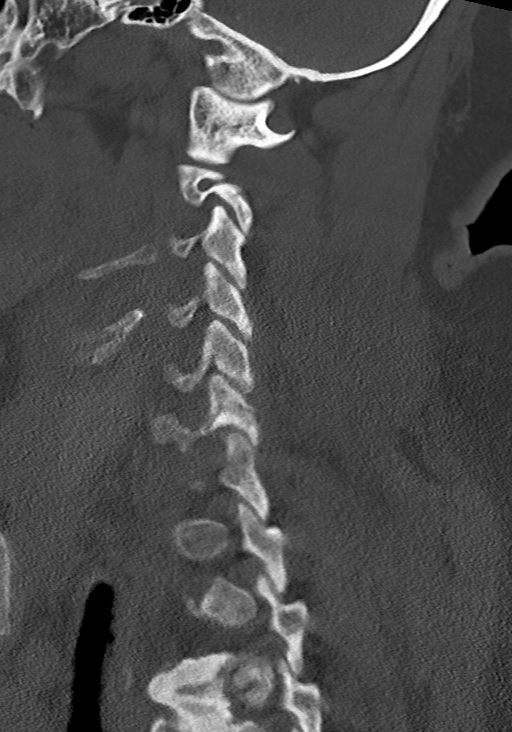
[im 26/61  bone]
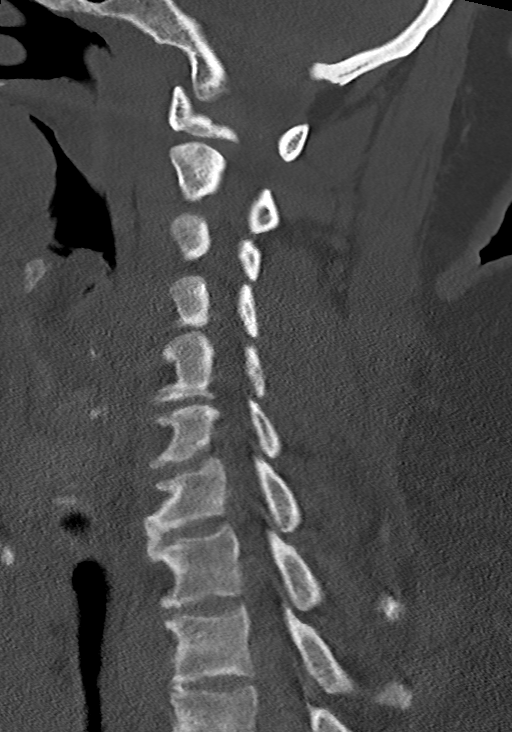
[im 31/61  soft-tissue]
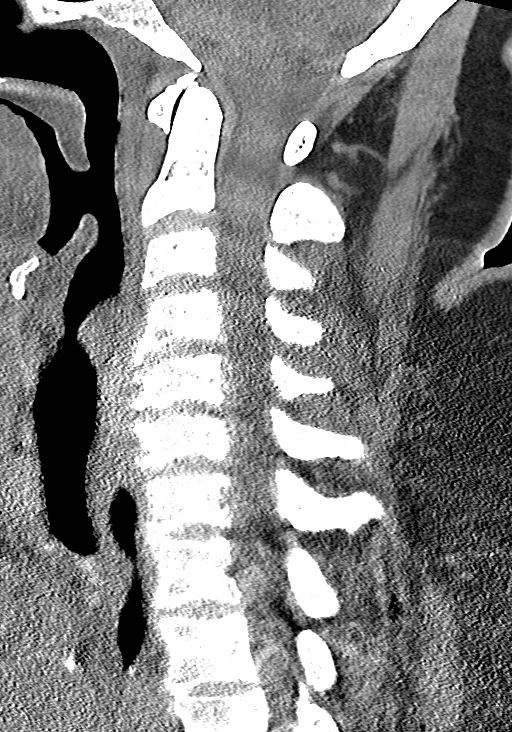
[im 31/61  bone]
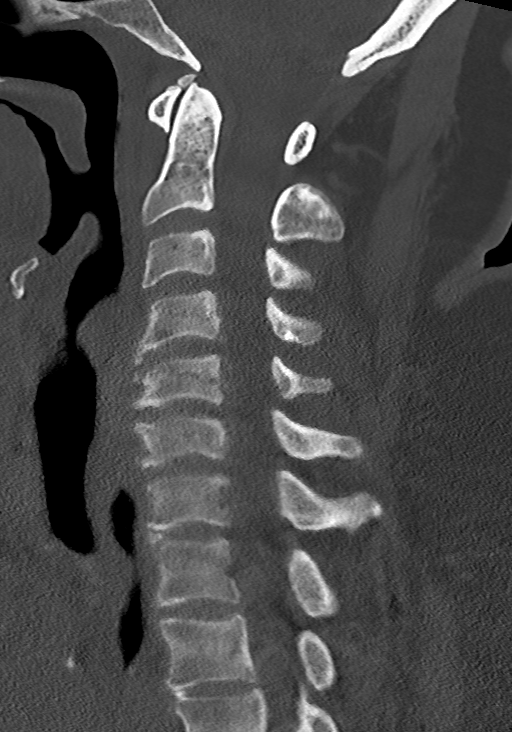
[im 36/61  bone]
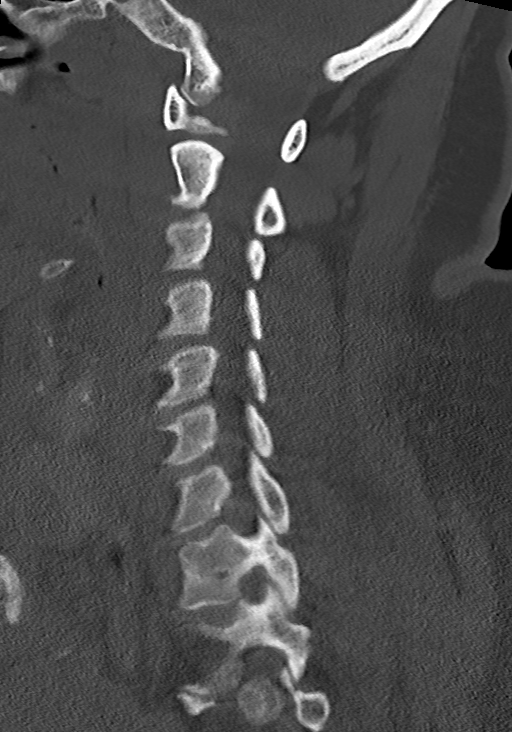
[im 41/61  bone]
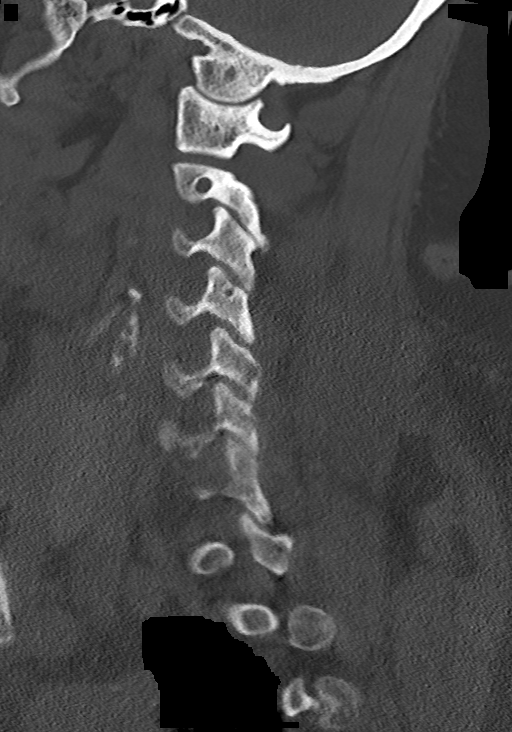

[Series 7: c_spine 2.0 cor bone · coronal · 0.26mm/px · 3 of 61 slices shown]
[im 13/61  bone]
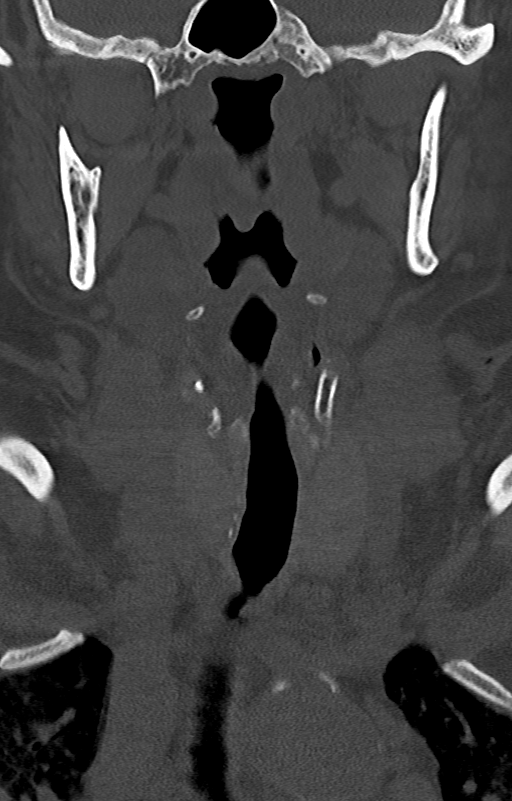
[im 25/61  bone]
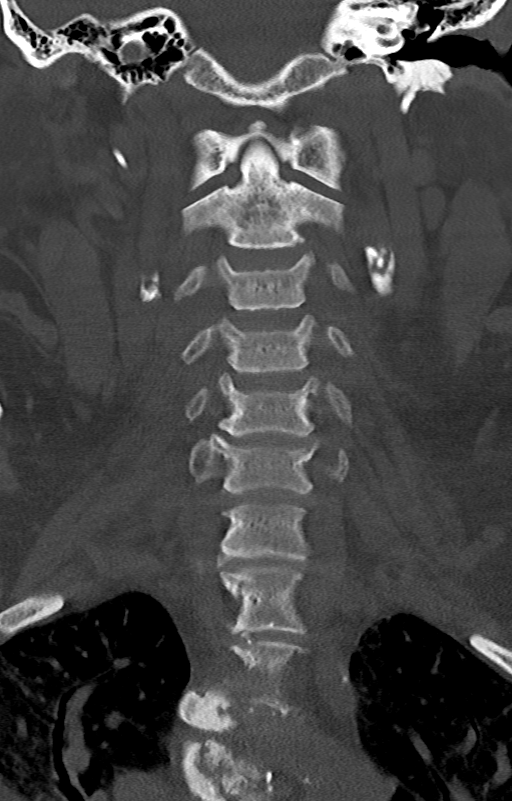
[im 37/61  bone]
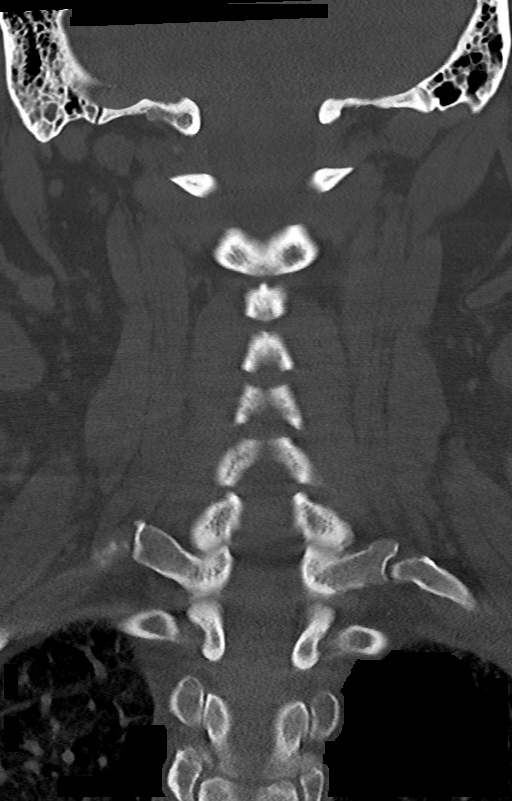

[12 of 33 positions shown; findings below may reference images not displayed]

FINDINGS: Alignment: Normal.

Skull base and vertebrae: No acute fracture. No primary bone lesion
or focal pathologic process.

Soft tissues and spinal canal: No prevertebral fluid or swelling. No
visible canal hematoma.

Disc levels: Mild degenerative disc disease is noted at C4-5, C5-6
and C6-7.

Upper chest: Right upper lobe airspace opacity is noted concerning
for possible pneumonia.

Other: None.
IMPRESSION: 1. Mild multilevel degenerative disc disease. No acute abnormality
seen in the cervical spine.
2. Right upper lobe airspace opacity is noted concerning for
possible pneumonia.

## 2020-03-08 IMAGING — DX DG CHEST 1V PORT
1 series · 1 of 1 positions shown · non-contrast
Comparison: [DATE]

CLINICAL DATA: Altered mental status

EXAM:
PORTABLE CHEST 1 VIEW

[chest]
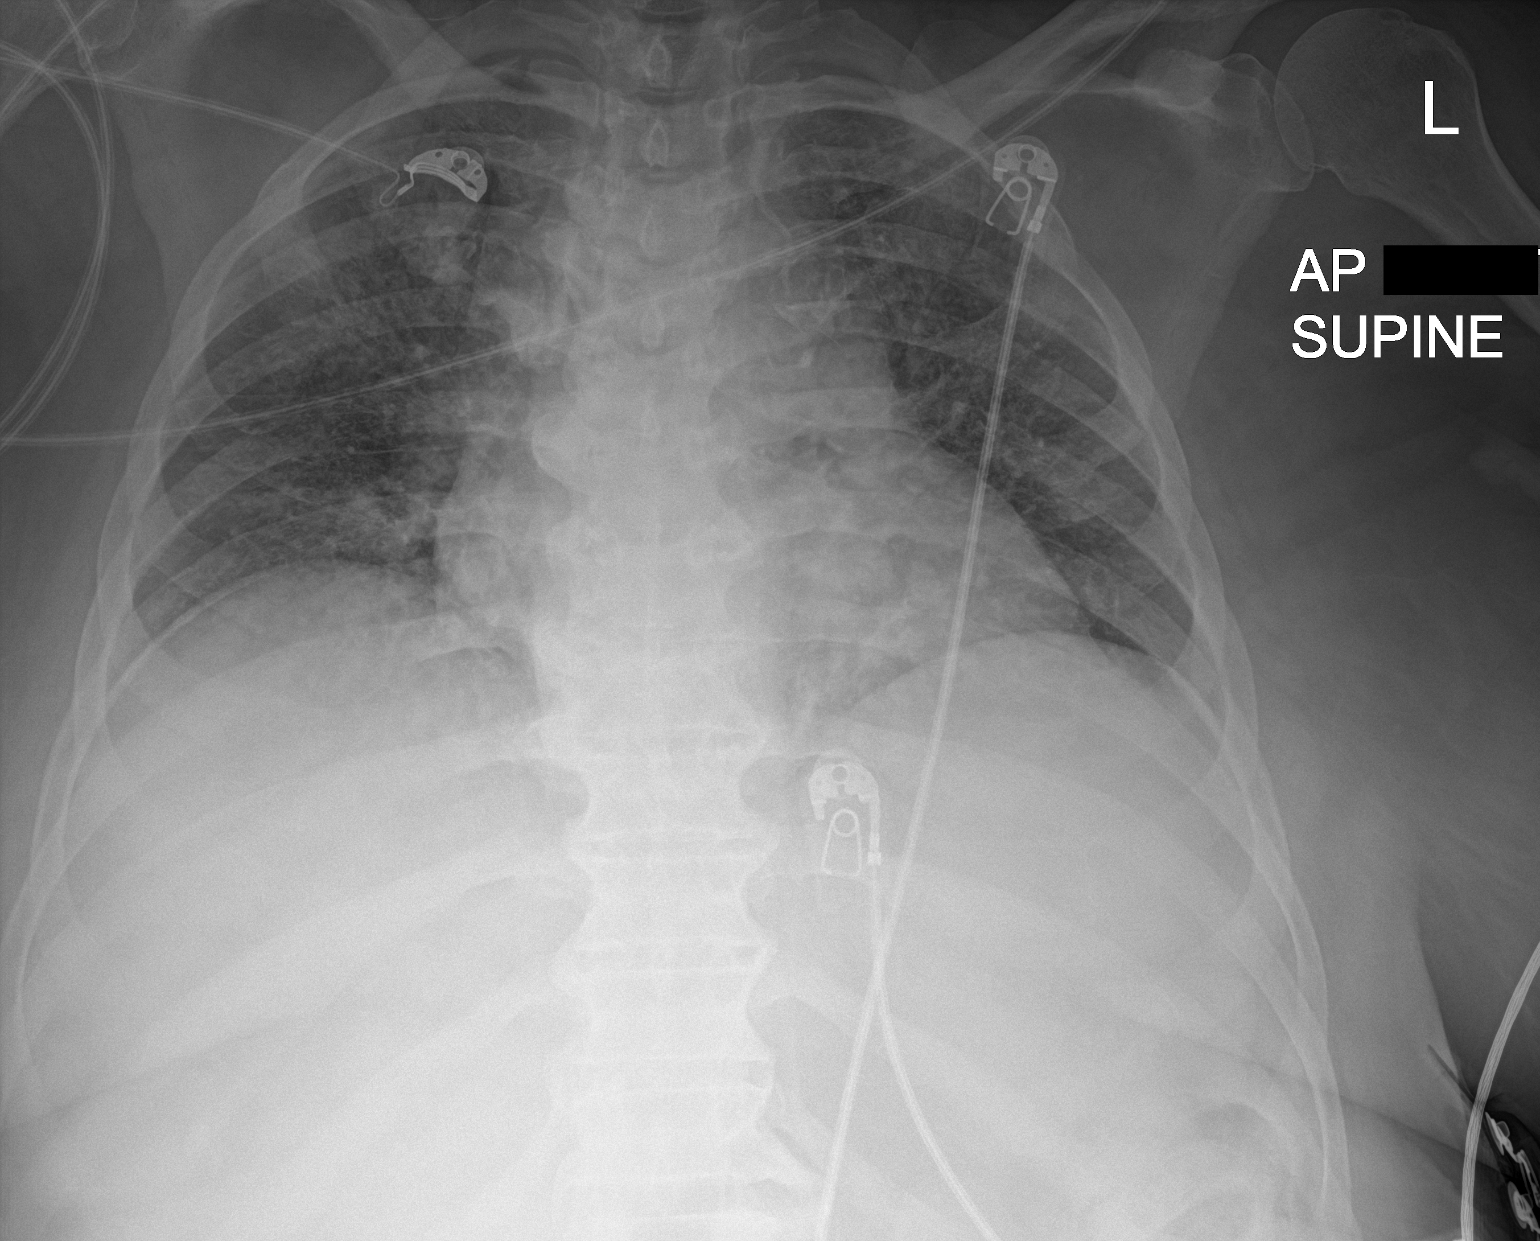

[1 of 1 positions shown; findings below may reference images not displayed]

FINDINGS: Low volume chest. Cardiomegaly and vascular pedicle prominence
accentuated by low volumes. Diffuse interstitial and airspace
opacity. No visible effusion or pneumothorax.
IMPRESSION: Low volume chest with indistinct opacity that could be scarring or
active infection when compared with recent priors.

## 2020-03-08 IMAGING — US US RENAL
1 series · 14 of 25 positions shown · non-contrast
Comparison: CT [DATE]

CLINICAL DATA: Acute kidney injury

EXAM:
RENAL / URINARY TRACT ULTRASOUND COMPLETE

[Series 1: us renal · 14 of 31 slices shown]
[im 1/31]
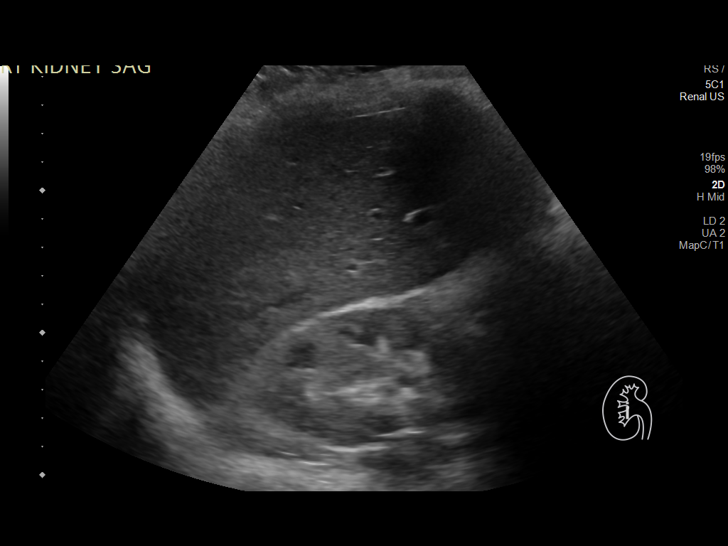
[im 3/31]
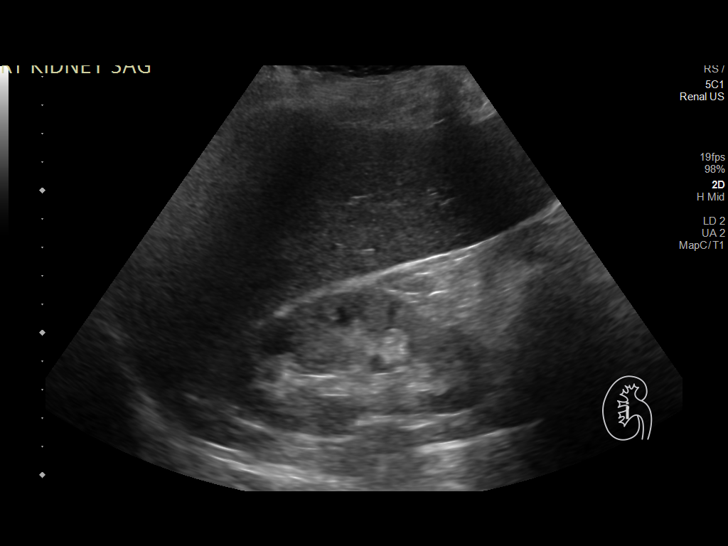
[im 6/31]
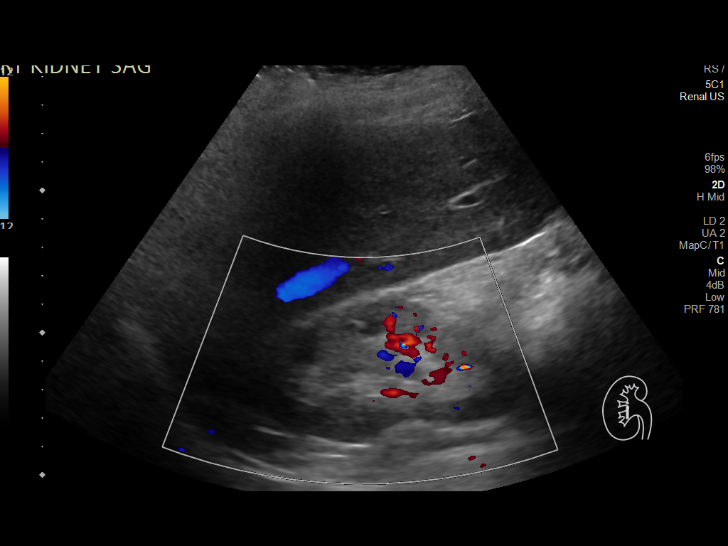
[im 8/31]
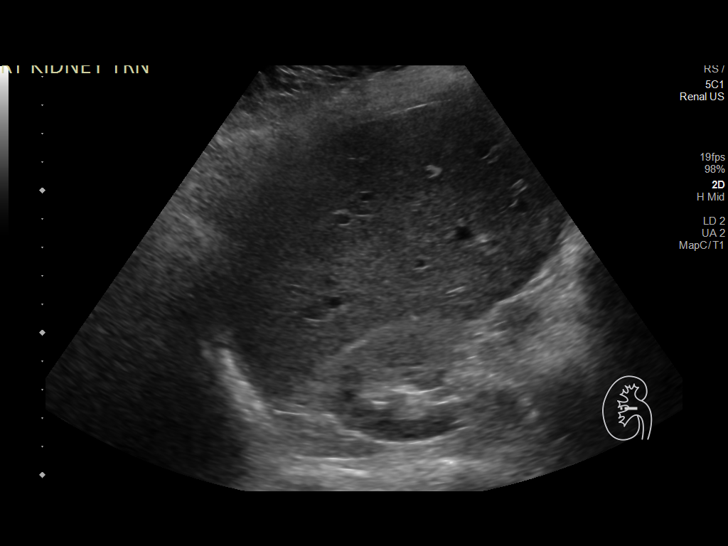
[im 11/31]
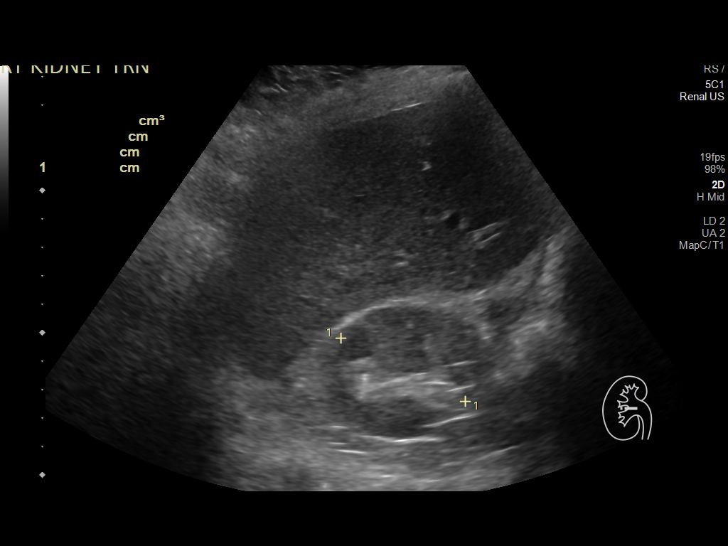
[im 12/31]
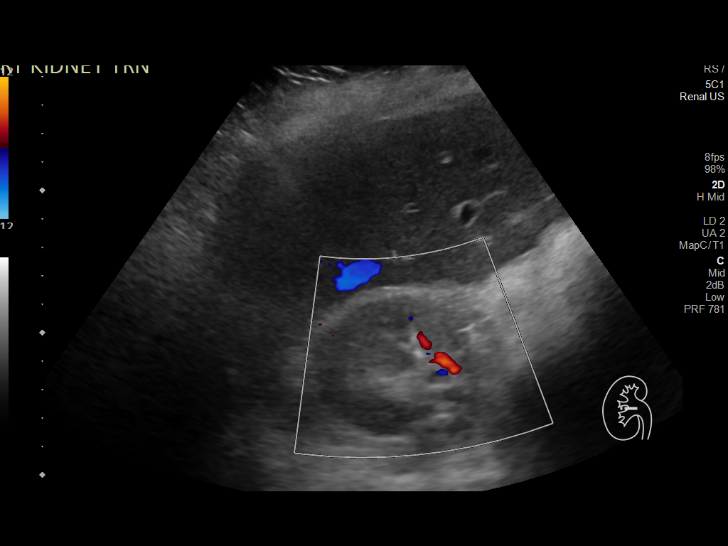
[im 14/31]
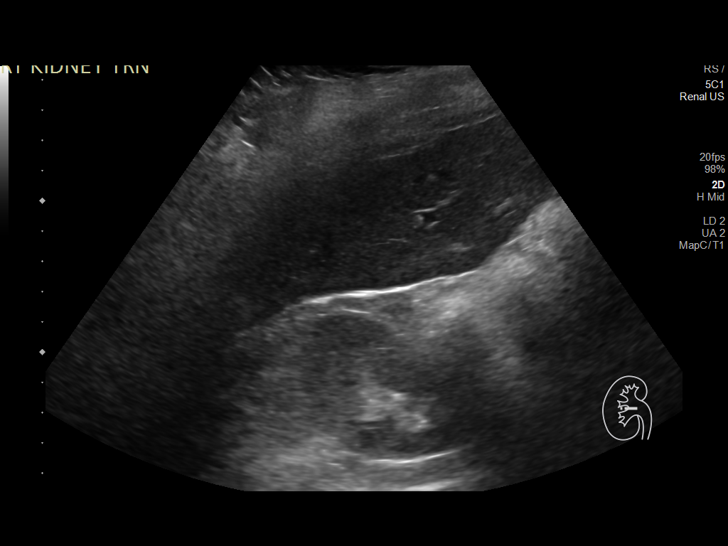
[im 17/31]
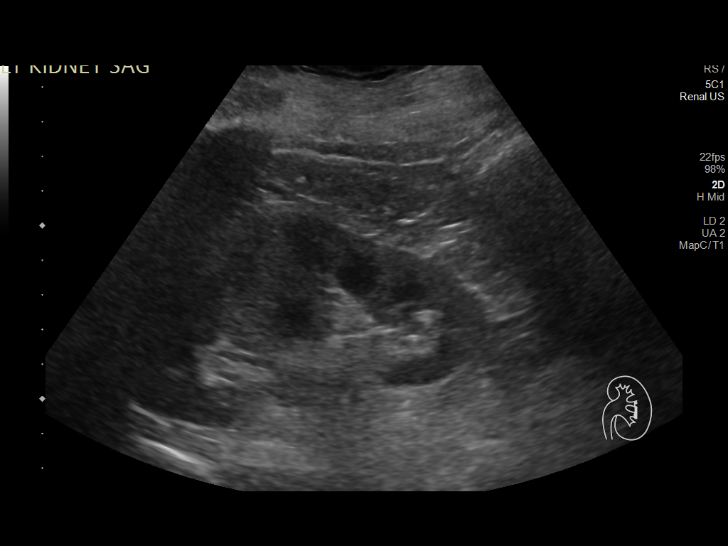
[im 19/31]
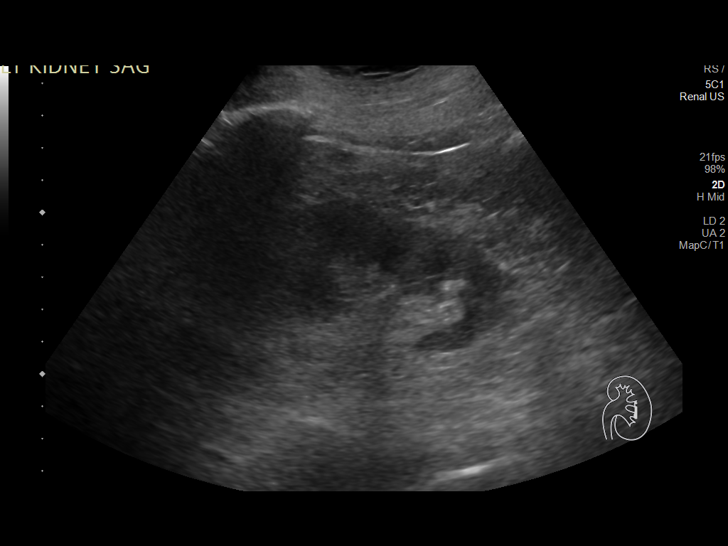
[im 21/31]
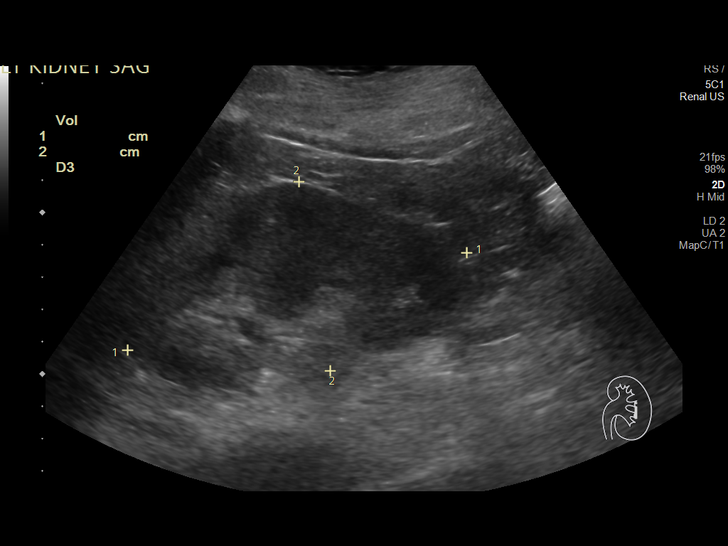
[im 23/31]
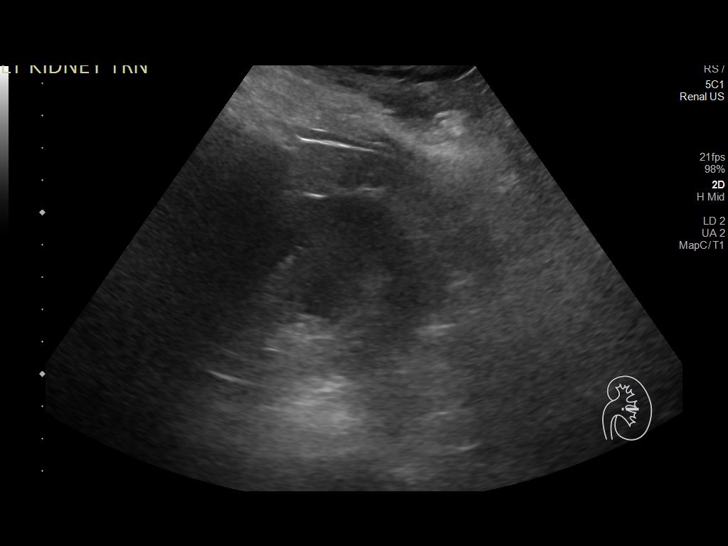
[im 26/31]
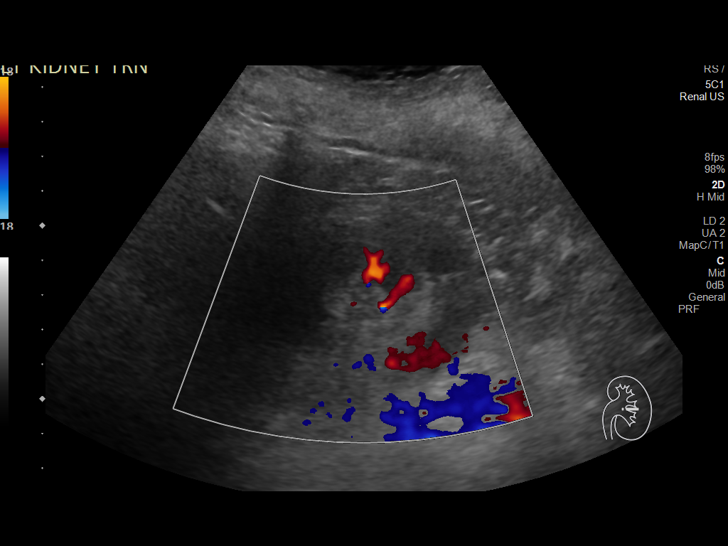
[im 28/31]
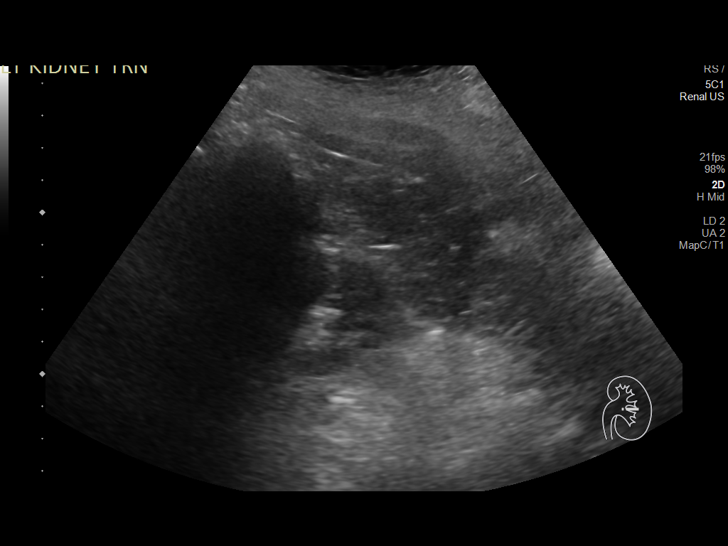
[im 31/31]
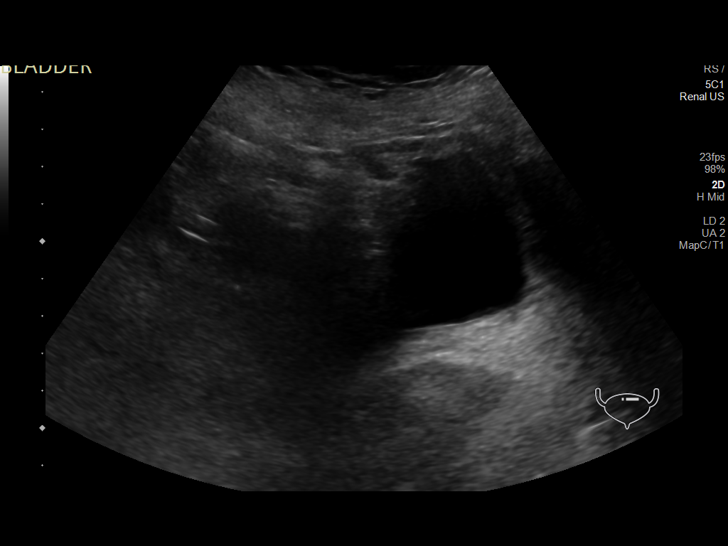

[14 of 25 positions shown; findings below may reference images not displayed]

FINDINGS: Right Kidney:

Renal measurements: 10.2 x 4.8 x 4.9 cm = volume: 125 mL. Mildly
increased renal cortical echogenicity. No mass, shadowing stone, or
hydronephrosis visualized.

Left Kidney:

Renal measurements: 10.9 x 5.9 x 6.2 cm = volume: 209 mL. Mildly
increased renal cortical echogenicity. No mass, shadowing stone, or
hydronephrosis visualized.

Bladder:

Appears normal for degree of bladder distention.

Other:

None.
IMPRESSION: 1. Negative for obstructive uropathy.
2. Mildly increased renal cortical echogenicity bilaterally
suggesting sequela of medical renal disease.

## 2020-03-08 IMAGING — CT CT HEAD CODE STROKE
3 series · 15 of 47 positions shown, 18 images · non-contrast
Comparison: [DATE]

CLINICAL DATA: Code stroke.

EXAM:
CT HEAD WITHOUT CONTRAST
TECHNIQUE: Contiguous axial images were obtained from the base of the skull
through the vertex without intravenous contrast.

[Series 3: head 5.0 st · axial · 0.43mm/px · z∈[-124,+6]mm · 9 of 32 slices shown, 12 images]
[im 3/32  brain]
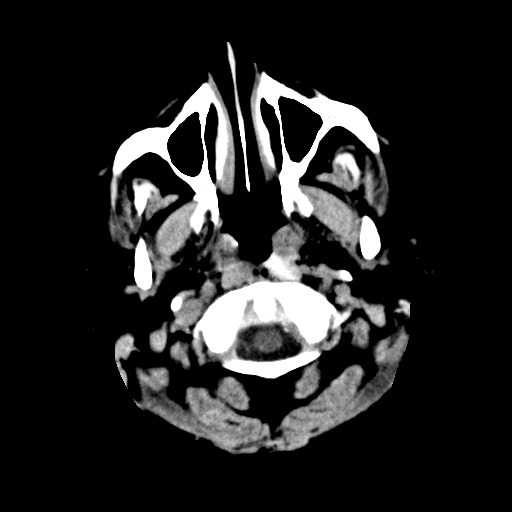
[im 3/32  bone]
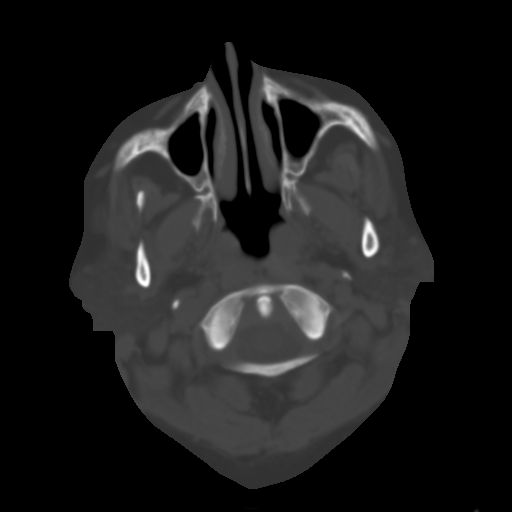
[im 6/32  brain]
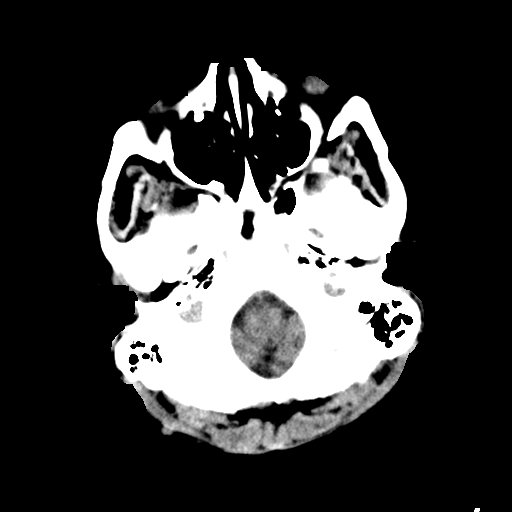
[im 9/32  brain]
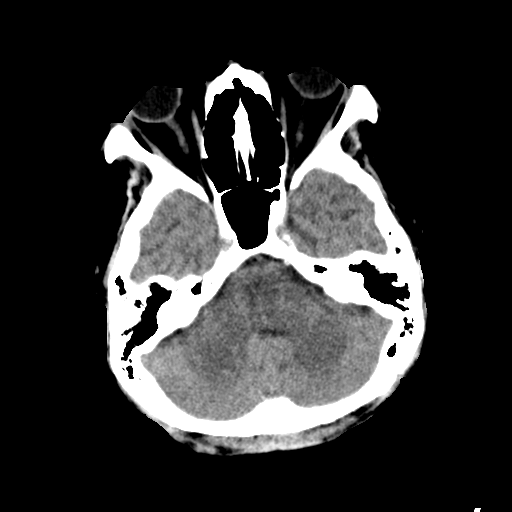
[im 12/32  brain]
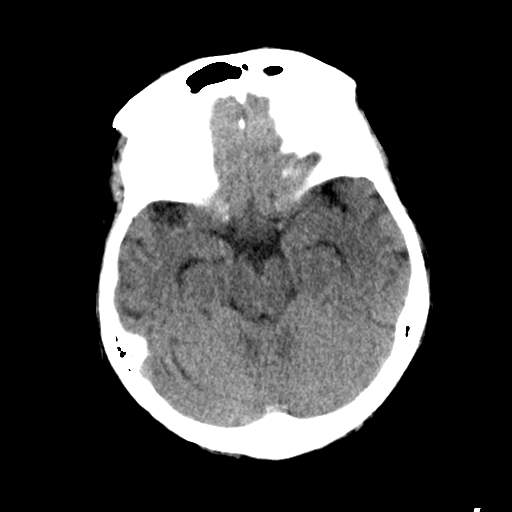
[im 17/32  brain]
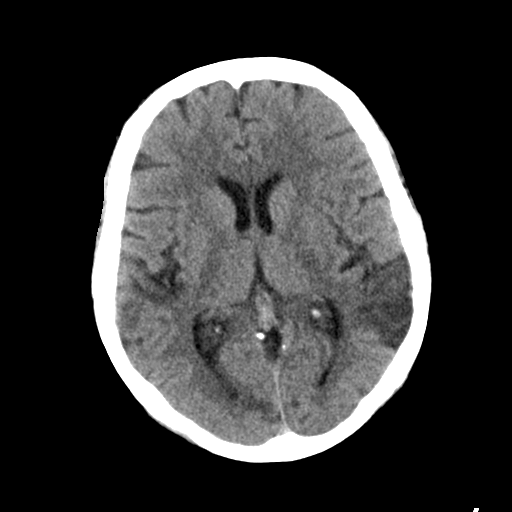
[im 17/32  bone]
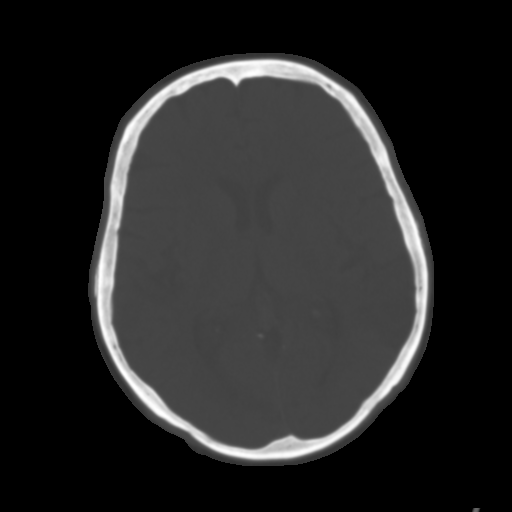
[im 20/32  brain]
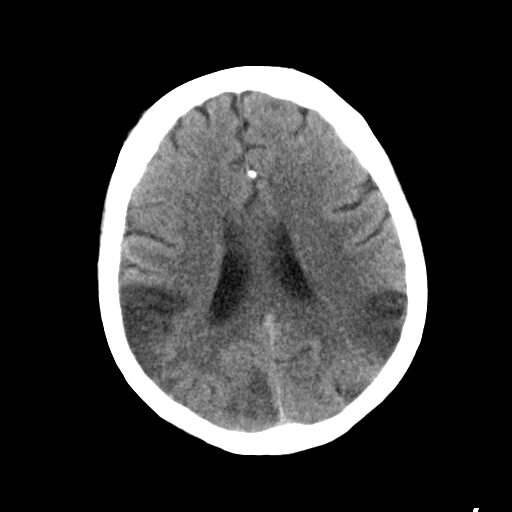
[im 23/32  brain]
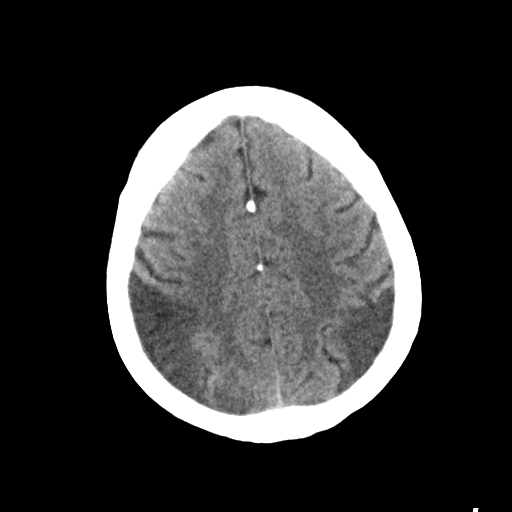
[im 26/32  brain]
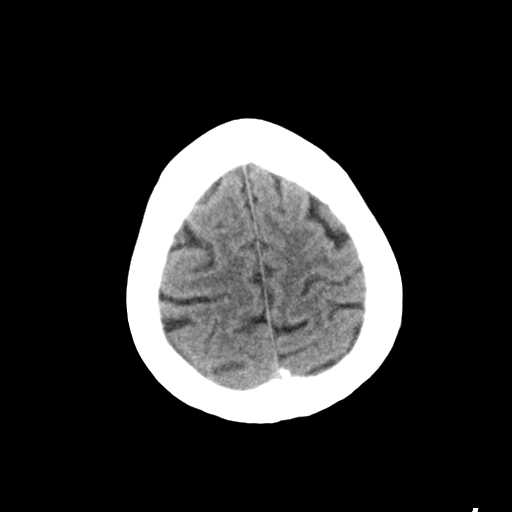
[im 29/32  brain]
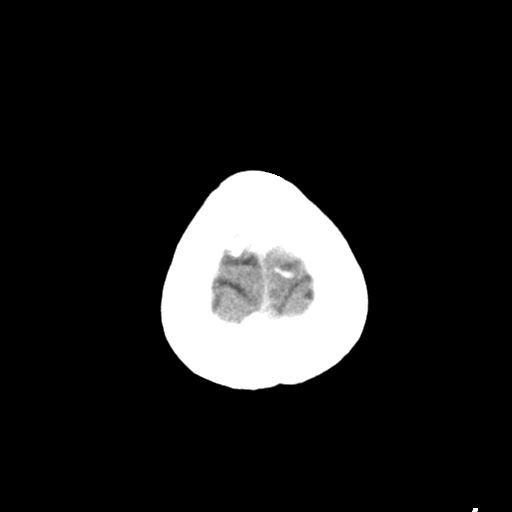
[im 29/32  bone]
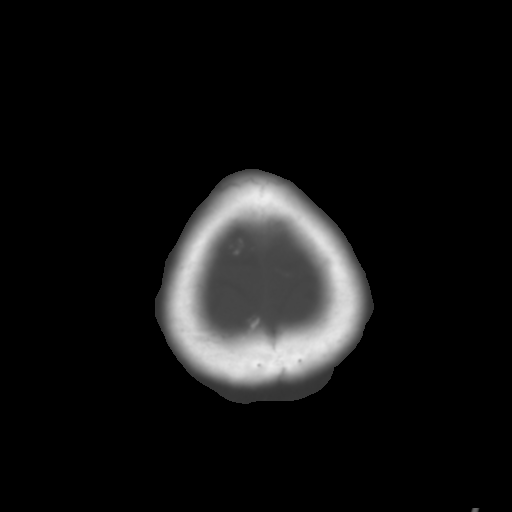

[Series 5: head 3.0 cor st · coronal · 0.31mm/px · 3 of 67 slices shown]
[im 23/67  brain]
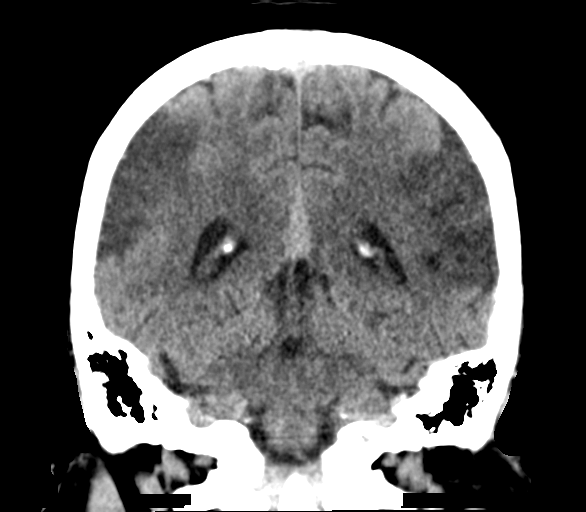
[im 30/67  brain]
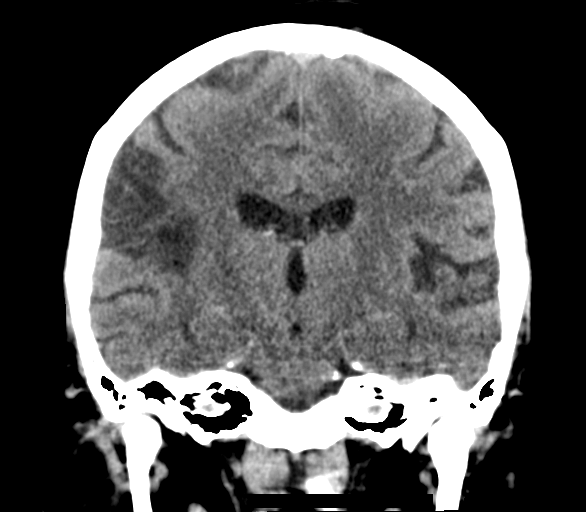
[im 37/67  brain]
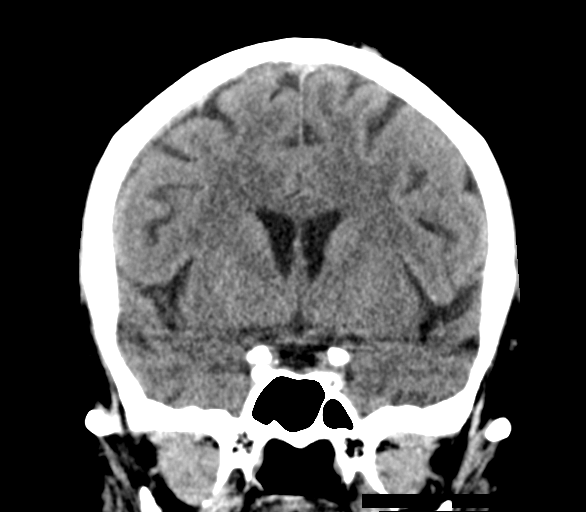

[Series 6: head 3.0 sag st · sagittal · 0.31mm/px · 3 of 66 slices shown]
[im 22/66  brain]
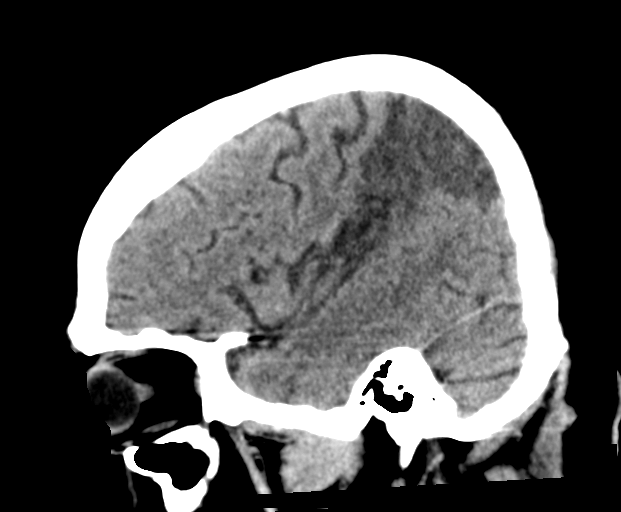
[im 33/66  brain]
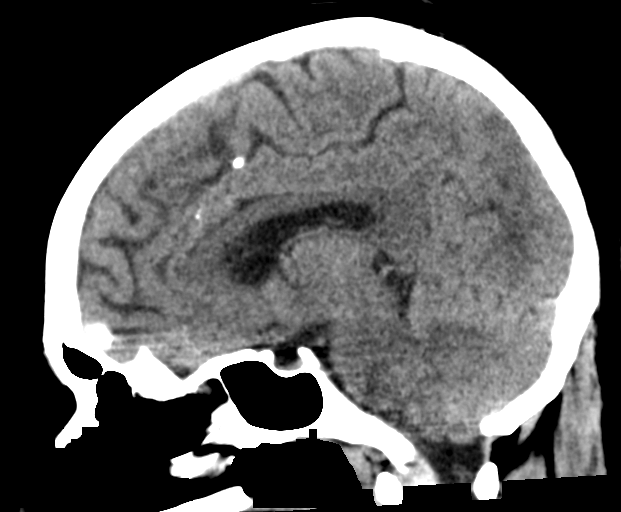
[im 44/66  brain]
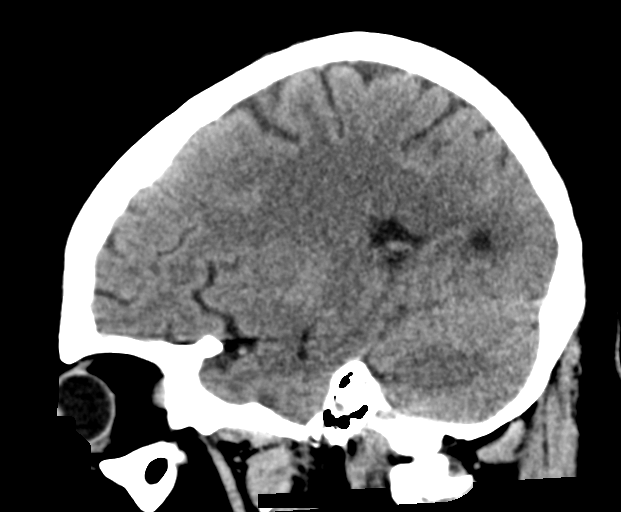

[15 of 47 positions shown; findings below may reference images not displayed]

FINDINGS: Brain: There is no acute intracranial hemorrhage or mass effect. Now
chronic bilateral parietal and left temporal infarcts are
identified. There is new hypoattenuation with loss of gray-white
differentiation in the parasagittal right occipital lobe likely
extending into the parietal lobe.

Ventricles are stable in size without evidence of hydrocephalus.

Vascular: No hyperdense vessel. There is intracranial
atherosclerotic calcification at the skull base.

Skull: Unremarkable.

Sinuses/Orbits: Aerated.  Orbits are unremarkable.

Other: Mastoid air cells are clear.
IMPRESSION: No acute intracranial hemorrhage or significant mass effect.
Age-indeterminate right parasagittal occipital infarct with parietal
extension.

Chronic bilateral MCA territory infarcts.

These results were communicated to Dr. RAMBRIKSH at [DATE] RAMBRIKSH
[DATE]by text page via the [employer] messaging system.

## 2020-03-08 MED ORDER — ACETAMINOPHEN 500 MG PO TABS
1000.0000 mg | ORAL_TABLET | Freq: Three times a day (TID) | ORAL | Status: DC | PRN
  Administered 2020-03-15 – 2020-03-18 (×3): 1000 mg via ORAL
  Filled 2020-03-08 (×3): qty 2

## 2020-03-08 MED ORDER — HALOPERIDOL LACTATE 5 MG/ML IJ SOLN
2.0000 mg | Freq: Four times a day (QID) | INTRAMUSCULAR | Status: DC | PRN
  Administered 2020-03-09: 2 mg via INTRAMUSCULAR
  Filled 2020-03-08: qty 1

## 2020-03-08 MED ORDER — NALOXONE HCL 0.4 MG/ML IJ SOLN
INTRAMUSCULAR | Status: AC
  Administered 2020-03-08: 0.4 mg via INTRAVENOUS
  Filled 2020-03-08: qty 1

## 2020-03-08 MED ORDER — LACTATED RINGERS IV BOLUS
1000.0000 mL | Freq: Once | INTRAVENOUS | Status: AC
  Administered 2020-03-08: 1000 mL via INTRAVENOUS

## 2020-03-08 MED ORDER — GLUCAGON HCL RDNA (DIAGNOSTIC) 1 MG IJ SOLR: 1.0000 mg | Freq: Once | INTRAMUSCULAR | Status: DC | PRN

## 2020-03-08 MED ORDER — NALOXONE HCL 0.4 MG/ML IJ SOLN
0.4000 mg | Freq: Once | INTRAMUSCULAR | Status: AC
  Administered 2020-03-08: 0.4 mg via INTRAVENOUS

## 2020-03-08 MED ORDER — ALBUTEROL SULFATE (2.5 MG/3ML) 0.083% IN NEBU: 3.0000 mL | INHALATION_SOLUTION | Freq: Four times a day (QID) | RESPIRATORY_TRACT | Status: DC | PRN

## 2020-03-08 MED ORDER — HYDRALAZINE HCL 25 MG PO TABS
25.0000 mg | ORAL_TABLET | Freq: Four times a day (QID) | ORAL | Status: DC | PRN
  Administered 2020-03-12: 25 mg via ORAL
  Filled 2020-03-08: qty 1

## 2020-03-08 MED ORDER — NALOXONE HCL 4 MG/10ML IJ SOLN
1.0000 mg/h | INTRAVENOUS | Status: DC
  Administered 2020-03-08: 1 mg/h via INTRAVENOUS
  Filled 2020-03-08: qty 10

## 2020-03-08 MED ORDER — SODIUM CHLORIDE 0.9 % IV BOLUS
1000.0000 mL | Freq: Once | INTRAVENOUS | Status: AC
  Administered 2020-03-08: 1000 mL via INTRAVENOUS

## 2020-03-08 MED ORDER — BICTEGRAVIR-EMTRICITAB-TENOFOV 50-200-25 MG PO TABS
1.0000 | ORAL_TABLET | Freq: Every day | ORAL | Status: DC
  Administered 2020-03-09 – 2020-03-21 (×14): 1 via ORAL
  Filled 2020-03-08 (×14): qty 1

## 2020-03-08 MED ORDER — NOREPINEPHRINE 4 MG/250ML-% IV SOLN
2.0000 ug/min | INTRAVENOUS | Status: DC
  Filled 2020-03-08: qty 250

## 2020-03-08 MED ORDER — DOXEPIN HCL 50 MG PO CAPS
50.0000 mg | ORAL_CAPSULE | Freq: Every day | ORAL | Status: DC
  Administered 2020-03-09 – 2020-03-20 (×12): 50 mg via ORAL
  Filled 2020-03-08 (×16): qty 1

## 2020-03-08 MED ORDER — HEPARIN SODIUM (PORCINE) 5000 UNIT/ML IJ SOLN
5000.0000 [IU] | Freq: Three times a day (TID) | INTRAMUSCULAR | Status: DC
  Administered 2020-03-08 – 2020-03-21 (×38): 5000 [IU] via SUBCUTANEOUS
  Filled 2020-03-08 (×36): qty 1

## 2020-03-08 MED ORDER — NALOXONE HCL 2 MG/2ML IJ SOSY
PREFILLED_SYRINGE | INTRAMUSCULAR | Status: AC
  Filled 2020-03-08: qty 2

## 2020-03-08 MED ORDER — SODIUM CHLORIDE 0.9 % IV SOLN
1.5000 g | Freq: Two times a day (BID) | INTRAVENOUS | Status: DC
  Administered 2020-03-09 – 2020-03-10 (×3): 1.5 g via INTRAVENOUS
  Filled 2020-03-08 (×5): qty 4

## 2020-03-08 MED ORDER — SODIUM CHLORIDE 0.9 % IV SOLN
3.0000 g | Freq: Once | INTRAVENOUS | Status: AC
  Administered 2020-03-08: 3 g via INTRAVENOUS
  Filled 2020-03-08: qty 8

## 2020-03-08 MED ORDER — SODIUM CHLORIDE 0.9 % IV SOLN
250.0000 mL | INTRAVENOUS | Status: DC
  Administered 2020-03-09: 250 mL via INTRAVENOUS

## 2020-03-08 MED ORDER — DOCUSATE SODIUM 100 MG PO CAPS
100.0000 mg | ORAL_CAPSULE | Freq: Two times a day (BID) | ORAL | Status: DC | PRN
  Administered 2020-03-18: 100 mg via ORAL
  Filled 2020-03-08: qty 1

## 2020-03-08 MED ORDER — NALOXONE HCL 0.4 MG/ML IJ SOLN
0.4000 mg | Freq: Once | INTRAMUSCULAR | Status: AC
  Filled 2020-03-08: qty 1

## 2020-03-08 MED ORDER — NALOXONE HCL 4 MG/10ML IJ SOLN
0.2500 mg/h | INTRAVENOUS | Status: DC
  Administered 2020-03-08 (×2): 0.25 mg/h via INTRAVENOUS
  Filled 2020-03-08: qty 10

## 2020-03-08 MED ORDER — SODIUM CHLORIDE 0.9 % IV SOLN: INTRAVENOUS | Status: DC

## 2020-03-08 MED ORDER — ONDANSETRON HCL 4 MG/2ML IJ SOLN
INTRAMUSCULAR | Status: AC
  Administered 2020-03-08: 4 mg
  Filled 2020-03-08: qty 2

## 2020-03-08 MED ORDER — DEXTROSE 50 % IV SOLN
INTRAVENOUS | Status: AC
  Administered 2020-03-08: 50 mL
  Filled 2020-03-08: qty 50

## 2020-03-08 MED ORDER — NALOXONE HCL 0.4 MG/ML IJ SOLN
INTRAMUSCULAR | Status: AC
  Filled 2020-03-08: qty 1

## 2020-03-08 MED ORDER — QUETIAPINE FUMARATE 200 MG PO TABS
200.0000 mg | ORAL_TABLET | Freq: Two times a day (BID) | ORAL | Status: DC
  Administered 2020-03-09 – 2020-03-21 (×25): 200 mg via ORAL
  Filled 2020-03-08 (×4): qty 1
  Filled 2020-03-08: qty 8
  Filled 2020-03-08 (×22): qty 1
  Filled 2020-03-08: qty 2

## 2020-03-08 MED ORDER — ACETAMINOPHEN ER 650 MG PO TBCR: 1300.0000 mg | EXTENDED_RELEASE_TABLET | Freq: Three times a day (TID) | ORAL | Status: DC | PRN

## 2020-03-08 MED ORDER — UMECLIDINIUM-VILANTEROL 62.5-25 MCG/INH IN AEPB
1.0000 | INHALATION_SPRAY | Freq: Every day | RESPIRATORY_TRACT | Status: DC
  Administered 2020-03-09 – 2020-03-21 (×10): 1 via RESPIRATORY_TRACT
  Filled 2020-03-08 (×3): qty 14

## 2020-03-08 MED ORDER — DEXTROSE-NACL 5-0.9 % IV SOLN: INTRAVENOUS | Status: AC

## 2020-03-08 MED ORDER — ROSUVASTATIN CALCIUM 20 MG PO TABS
20.0000 mg | ORAL_TABLET | Freq: Every day | ORAL | Status: DC
  Administered 2020-03-09 – 2020-03-21 (×13): 20 mg via ORAL
  Filled 2020-03-08 (×6): qty 1
  Filled 2020-03-08: qty 4
  Filled 2020-03-08 (×6): qty 1

## 2020-03-08 MED ORDER — INSULIN ASPART 100 UNIT/ML ~~LOC~~ SOLN: 0.0000 [IU] | Freq: Three times a day (TID) | SUBCUTANEOUS | Status: DC

## 2020-03-08 MED ORDER — GABAPENTIN 100 MG PO CAPS
100.0000 mg | ORAL_CAPSULE | Freq: Two times a day (BID) | ORAL | Status: DC
  Administered 2020-03-09 – 2020-03-21 (×25): 100 mg via ORAL
  Filled 2020-03-08 (×25): qty 1

## 2020-03-08 MED ORDER — ASPIRIN 81 MG PO CHEW
81.0000 mg | CHEWABLE_TABLET | Freq: Every day | ORAL | Status: DC
  Administered 2020-03-09 – 2020-03-21 (×13): 81 mg via ORAL
  Filled 2020-03-08 (×13): qty 1

## 2020-03-08 MED ORDER — PANTOPRAZOLE SODIUM 40 MG PO TBEC
40.0000 mg | DELAYED_RELEASE_TABLET | Freq: Every day | ORAL | Status: DC
  Administered 2020-03-09 – 2020-03-21 (×13): 40 mg via ORAL
  Filled 2020-03-08 (×13): qty 1

## 2020-03-08 MED ORDER — POLYETHYLENE GLYCOL 3350 17 G PO PACK
17.0000 g | PACK | Freq: Every day | ORAL | Status: DC | PRN
  Administered 2020-03-18: 17 g via ORAL
  Filled 2020-03-08: qty 1
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
## 2020-03-08 NOTE — ED Notes (Signed)
Pt to MRI

## 2020-03-08 NOTE — ED Notes (Signed)
Daughter arrived at bedside, states that the pt got Nucynta yesterday for back pain, has had to get narcan in past w/this

## 2020-03-08 NOTE — ED Notes (Signed)
Verbal for 513ml NS bolus per Moshe Cipro NP
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
## 2020-03-08 NOTE — ED Triage Notes (Signed)
Pt found down at home by family, CBG 31. EMS trx w/glucagon and D10, recheck 138. Arrives lethargic, BP 80's/50's and disoriented. Appears to have some L neglect, code stroke called by Dr. Alvino Chapel.
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
## 2020-03-08 NOTE — ED Notes (Signed)
Dr Roosevelt Locks aware BP continues to remain low and glucose is 43

## 2020-03-08 NOTE — ED Notes (Signed)
Voicemail left with daughter regarding room assignment

## 2020-03-08 NOTE — Progress Notes (Signed)
Nurse reported that pt's BP low again and FS 43. Glucagon x1, D50 and change IVF to D5NS. D/W on call critical care, who is coming to see the patient emergently.

## 2020-03-08 NOTE — Code Documentation (Addendum)
Stroke Response Nurse Documentation Code Documentation  Susan [name] is a 59 y.o. female arriving to [facility] ED via [facility] on 03/08/2020 with past medical hx of HTN, DM, Schizophrenia, TIA, seizure, & IV substance abuse. Code stroke was activated by [facility]. Patient from home where she was LKW on 03/07/2020 at 2200 and now complaining of L side weakness, per family they heard patient fall this morning and found her down. Patient reportedly with hypoglycemia en route per EMS, treated and CBG now 178. On aspirin 81 mg daily. Stroke team to bedside in ED [facility]. Labs drawn on arrival, patient cleared for CT by Dr. Alvino Chapel. Patient to CT with team. NIHSS 13, see documentation for details and code stroke times. Patient with decreased LOC, not following commands, bilateral arm weakness and bilateral leg weakness on exam. The following imaging was completed:  CT Head and neck. Attempted MRI but patient hemodynamically unstable with SBP in the 60s.  Patient is not a candidate for tPA due to being outside of treatment window. Care/Plan: MRI/MRA when stable, q1h neuro checks. Bedside handoff with ED RN Susan [name].    [place]  Stroke Response RN
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
## 2020-03-08 NOTE — Progress Notes (Signed)
Pharmacy Antibiotic Note  Susan [name] is a 59 y.o. female admitted on 03/08/2020 with aspiration pneumonia.  Pharmacy has been consulted for Unasyn dosing.  Plan: -Start Unasyn 1.5 gm IV Q 12 hours -Monitor CBC, renal fx, cultures and clinical progress     Temp (24hrs), Avg:97.1 F (36.2 C), Min:96.6 F (35.9 C), Max:97.9 F (36.6 C)  Recent Labs  Lab 03/08/20 1043 03/08/20 1051  WBC 13.1*  --   CREATININE 3.40* 3.50*  LATICACIDVEN 3.8*  --     CrCl cannot be calculated (Unknown ideal weight.).    Allergies  Allergen Reactions  . Lyrica [Pregabalin] Swelling    Has LE swelling    Antimicrobials this admission: Unasyn 7/6 >>    Thank you for allowing pharmacy to be a part of this patient's care.  Albertina Parr, PharmD., BCPS, BCCCP Clinical Pharmacist Clinical phone for 03/08/20 until 11:30pm: [phone] If after 11:30pm, please refer to [facility] for unit-specific pharmacist

## 2020-03-09 ENCOUNTER — Inpatient Hospital Stay (HOSPITAL_COMMUNITY): Payer: Medicare Other

## 2020-03-09 DIAGNOSIS — I6330 Cerebral infarction due to thrombosis of unspecified cerebral artery: Secondary | ICD-10-CM | POA: Insufficient documentation

## 2020-03-09 LAB — GLUCOSE, CAPILLARY
Glucose-Capillary: 111 mg/dL — ABNORMAL HIGH (ref 70–99)
Glucose-Capillary: 136 mg/dL — ABNORMAL HIGH (ref 70–99)
Glucose-Capillary: 116 mg/dL — ABNORMAL HIGH (ref 70–99)
Glucose-Capillary: 173 mg/dL — ABNORMAL HIGH (ref 70–99)
Glucose-Capillary: 217 mg/dL — ABNORMAL HIGH (ref 70–99)
Glucose-Capillary: 273 mg/dL — ABNORMAL HIGH (ref 70–99)

## 2020-03-09 LAB — URINE CULTURE: Culture: <10000 — AB

## 2020-03-09 LAB — MRSA PCR SCREENING: MRSA by PCR: POSITIVE — AB

## 2020-03-09 IMAGING — MR MR MRA HEAD W/O CM
10 of 12 series · 30 of 48 positions shown · non-contrast
Comparison: Head CT yesterday. Brain MRI and intracranial MRA
[DATE].

CLINICAL DATA: 58-year-old female code stroke presentation
yesterday. No IV tPA given. Age indeterminate right occipital
infarct on plain head CT.

EXAM:
MRI HEAD WITHOUT CONTRAST
MRA HEAD WITHOUT CONTRAST
TECHNIQUE: Multiplanar, multiecho pulse sequences of the brain and surrounding
structures were obtained without intravenous contrast. Angiographic
images of the head were obtained using MRA technique without
contrast.

[Series 4: DWI · axial · 3.0mm · 1.09mm/px · z∈[-67,+81]mm · 7 of 102 slices shown (1 of 4)]
[im 1/102]
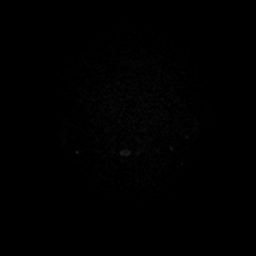
[im 17/102]
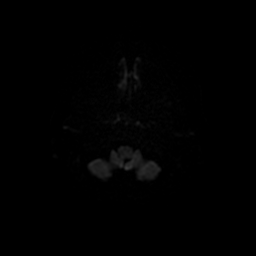
[im 34/102]
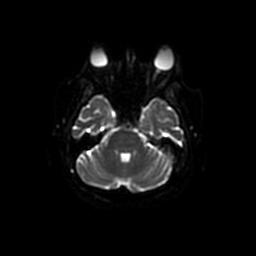
[im 51/102]
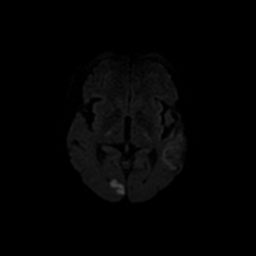
[im 68/102]
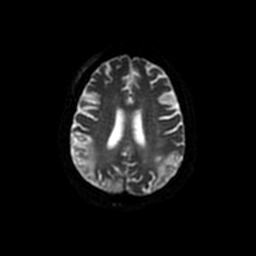
[im 85/102]
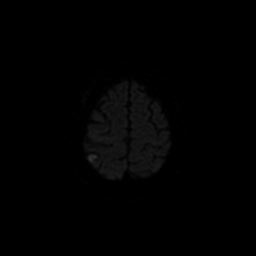
[im 102/102]
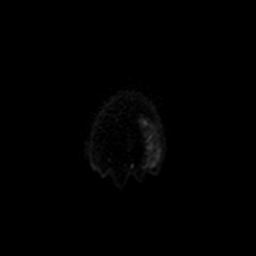

[Series 5: DWI · coronal · 5.0mm · 1.09mm/px · 5 of 72 slices shown (2 of 4)]
[im 1/72]
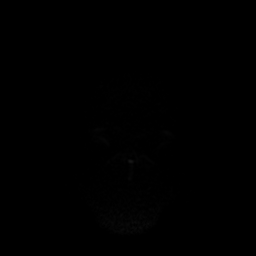
[im 18/72]
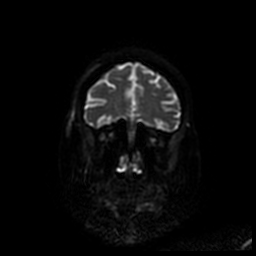
[im 36/72]
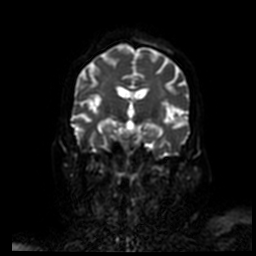
[im 54/72]
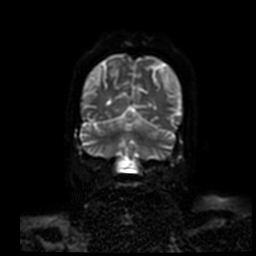
[im 72/72]
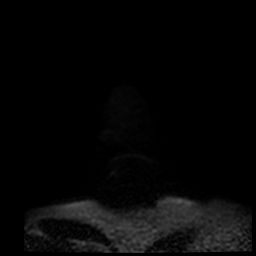

[Series 6: T1 · sagittal · 5.0mm · 0.47mm/px · 2 of 23 slices shown (1 of 3)]
[im 1/23]
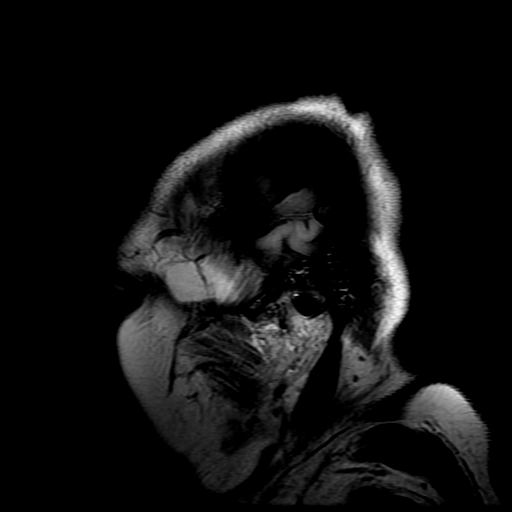
[im 23/23]
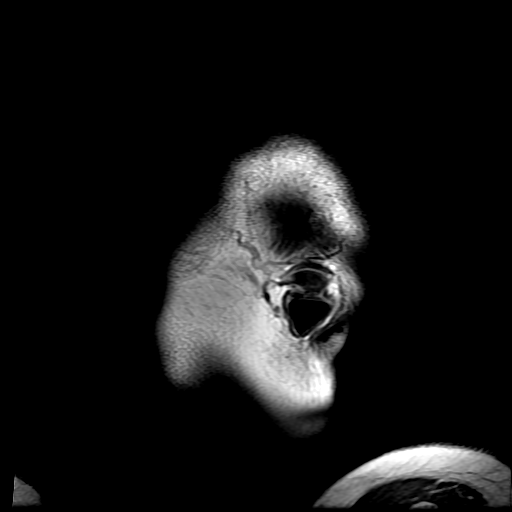

[Series 6: T1 · sagittal · 5.0mm · 0.47mm/px · 2 of 23 slices shown (2 of 3)]
[im 1/23]
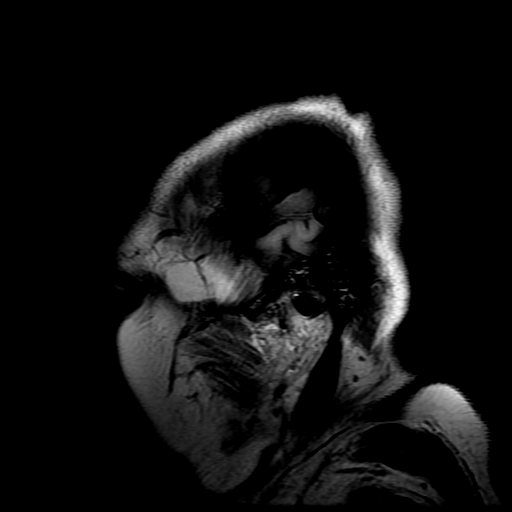
[im 23/23]
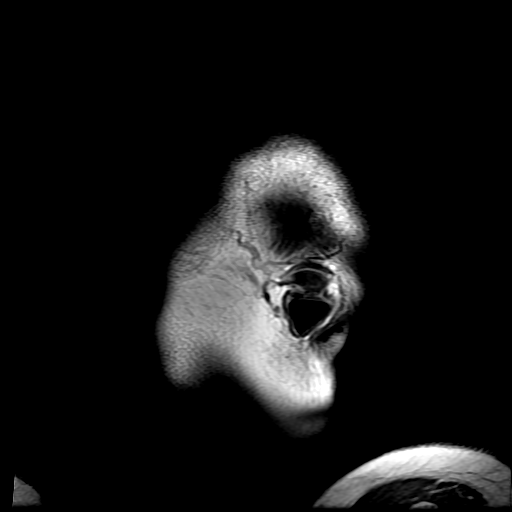

[Series 7: T2 · axial · 5.0mm · 0.43mm/px · z∈[-62,+86]mm · 2 of 26 slices shown (1 of 2)]
[im 1/26]
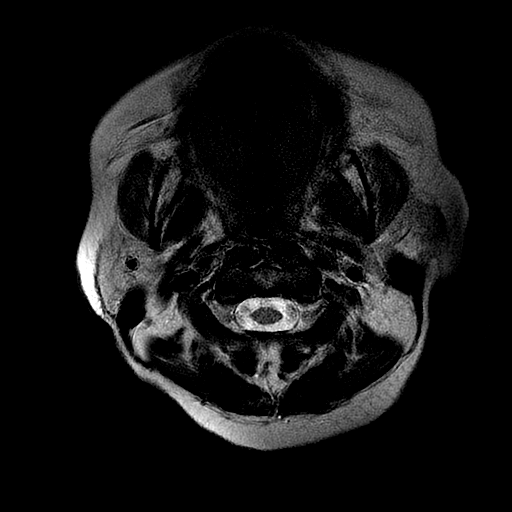
[im 26/26]
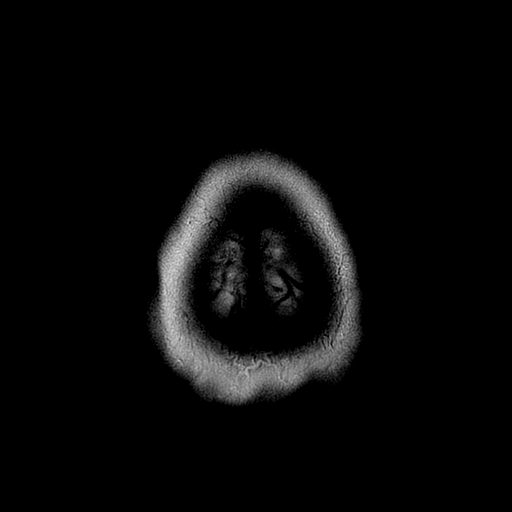

[Series 8: FLAIR · axial · 3.0mm · 0.43mm/px · z∈[-62,+86]mm · 2 of 26 slices shown]
[im 1/26]
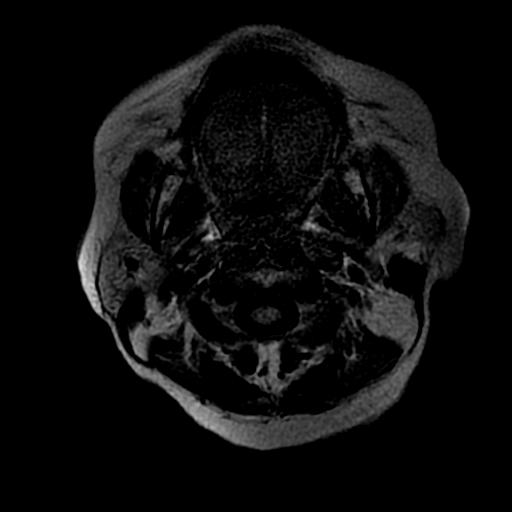
[im 26/26]
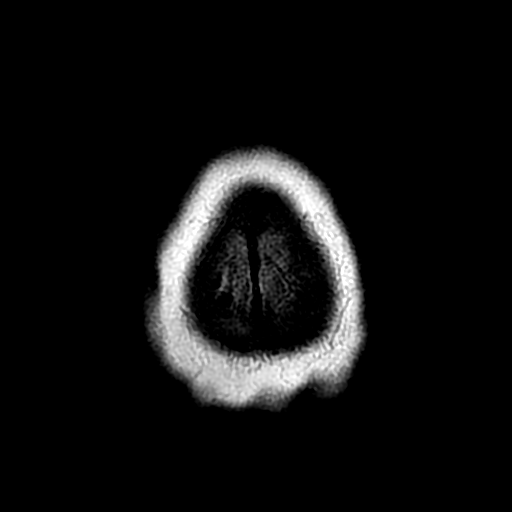

[Series 10: T1 · axial · 3.0mm · 0.43mm/px · 1 of 104 slices shown (3 of 3)]
[im 1/104]
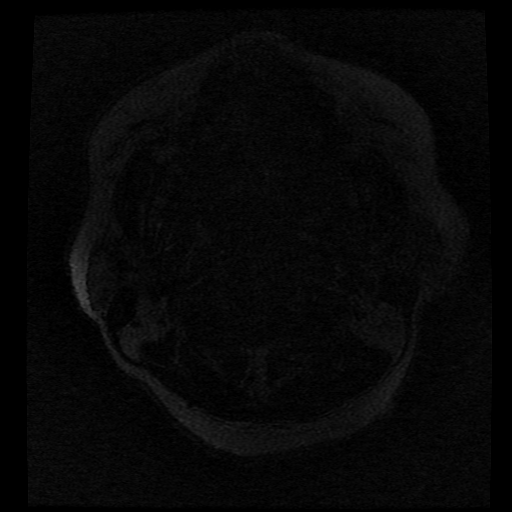

[Series 11: T2 · coronal · 5.0mm · 0.43mm/px · 2 of 24 slices shown (2 of 2)]
[im 1/24]
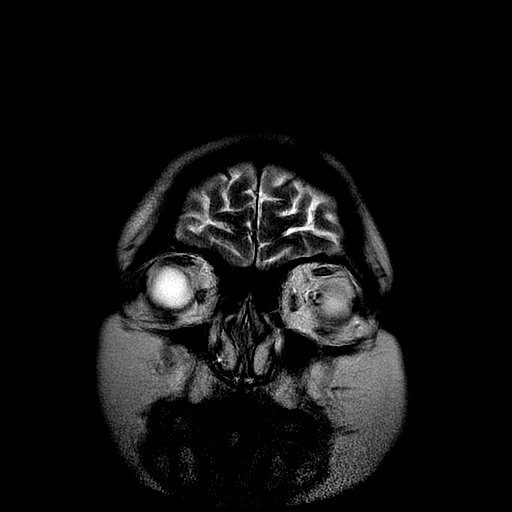
[im 24/24]
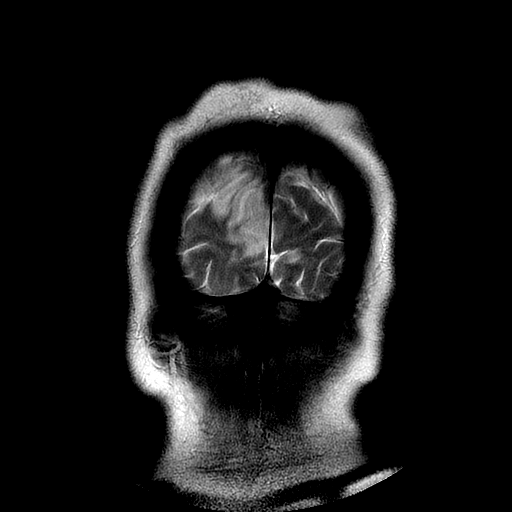

[Series 400: DWI · axial · 3.0mm · 1.09mm/px · z∈[-67,+81]mm · 4 of 51 slices shown (3 of 4)]
[im 1/51]
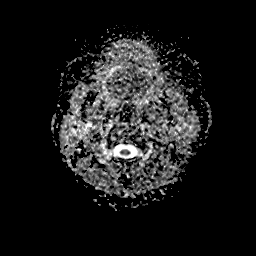
[im 17/51]
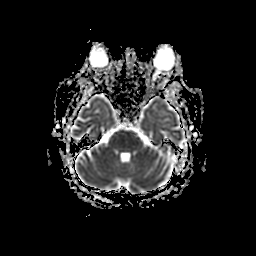
[im 34/51]
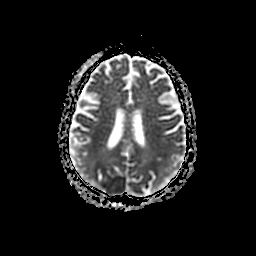
[im 51/51]
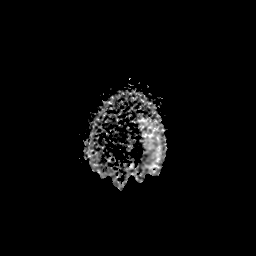

[Series 500: DWI · coronal · 5.0mm · 1.09mm/px · 3 of 34 slices shown (4 of 4)]
[im 1/34]
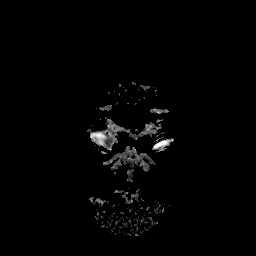
[im 17/34]
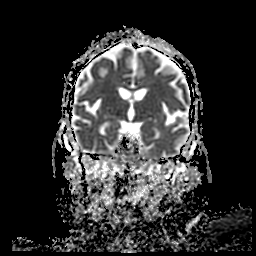
[im 34/34]
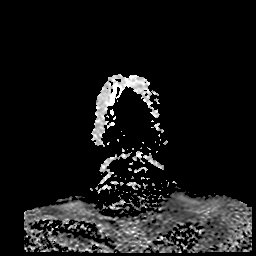

[30 of 48 positions shown; findings below may reference images not displayed]

FINDINGS: MRI HEAD FINDINGS

Brain: Confluent new 5 cm area of restricted diffusion in the
posterior right parietal and superior occipital lobes (series 5,
image 8), corresponding to the CT finding yesterday. This involves
mostly the right MCA/PCA watershed area. There is a ski me a at the
right occipital pole, but the remaining right occipital lobe, and
right thalamus are spared.

Cytotoxic edema with T2 and FLAIR hyperintensity. There is some T1
hypointensity. Trace petechial hemorrhage (series 9, image 16 -
Heidelberg Classification 1a). No significant mass effect.

Superimposed developing encephalomalacia in the bilateral posterior
MCA territories which were acutely infarcted on [DATE]. Mild
residual diffusion abnormality bilaterally. Laminar necrosis and
trace hemosiderin. No mass effect.

No other restricted diffusion. No midline shift, mass effect,
evidence of mass lesion, ventriculomegaly, extra-axial collection.
Cervicomedullary junction and pituitary are within normal limits.
Signal in the deep gray nuclei, brainstem and cerebellum remains
normal.

Vascular: Major intracranial vascular flow voids appear stable since
[REDACTED].

Skull and upper cervical spine: Negative visible cervical spine.
Visualized bone marrow signal is within normal limits.

Sinuses/Orbits: Leftward gaze deviation, otherwise negative orbits.
Paranasal sinuses are clear.

Other: Bilateral mastoid effusions have substantially regressed but
not completely resolved. Negative visible nasopharynx; tiny
retention cyst near midline on the left.

MRA HEAD FINDINGS

Mildly degraded by motion artifact today.

Antegrade flow signal in the posterior circulation appears stable
since [REDACTED], with a persistent left side trigeminal artery
substantially supplying the basilar artery distal to the patent AICA
origins. Dominant distal left vertebral artery. The intervening
basilar segment between the a ICAs and the persistent trigeminal has
not definitely changed.

SCA and PCA origins are patent and appear stable. There is a right
side posterior communicating artery redemonstrated. Left PCA
branches are stable and within normal limits. Right PCA branches are
also largely stable and within normal limits; questionable absence
of a very distal branch today when compared to [DATE] MIP
images, on series 306, image 17. Although that branch might still be
present on the source images.

Stable antegrade flow signal in the anterior circulation. Stable
left side persistent trigeminal artery origin. Mild anterior genu
siphon irregularity bilaterally with associated mild stenosis.
Patent carotid termini. Normal right posterior communicating artery
origin. MCA and ACA origins remain patent. Diminutive or absent
anterior communicating artery. Visible ACA branches are stable and
within normal limits. Both MCA bifurcations remain patent without
stenosis. And visible bilateral MCA branches appear stable since
[REDACTED], within normal limits.
IMPRESSION: 1. Acute to subacute infarct involving some of the Right PCA and the
Right MCA/PCA watershed area as seen by CT yesterday. Trace
petechial blood but no malignant hemorrhagic transformation or mass
effect.

2. Expected evolution of superimposed confluent bilateral posterior
MCA territory infarcts since [DATE]. Stable intracranial MRA since [REDACTED], with no large vessel
occlusion.
Mild ICA siphon atherosclerosis stenosis and Persistent Left side
Trigeminal Artery .

## 2020-03-09 IMAGING — MR MR HEAD W/O CM
9 of 11 series · 31 of 48 positions shown · non-contrast
Comparison: Head CT yesterday. Brain MRI and intracranial MRA
[DATE].

CLINICAL DATA: 58-year-old female code stroke presentation
yesterday. No IV tPA given. Age indeterminate right occipital
infarct on plain head CT.

EXAM:
MRI HEAD WITHOUT CONTRAST
MRA HEAD WITHOUT CONTRAST
TECHNIQUE: Multiplanar, multiecho pulse sequences of the brain and surrounding
structures were obtained without intravenous contrast. Angiographic
images of the head were obtained using MRA technique without
contrast.

[Series 4: DWI · axial · 3.0mm · 1.09mm/px · z∈[-67,+81]mm · 7 of 102 slices shown (1 of 4)]
[im 1/102]
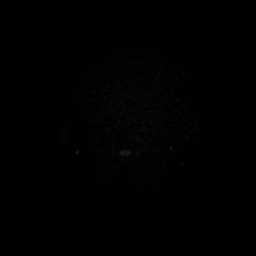
[im 17/102]
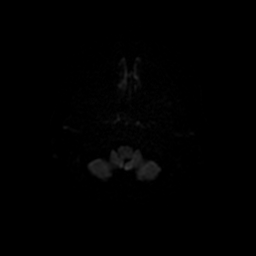
[im 34/102]
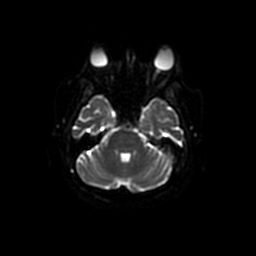
[im 51/102]
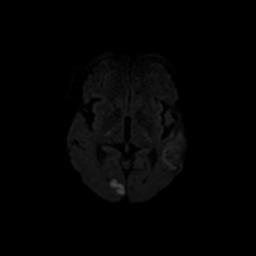
[im 68/102]
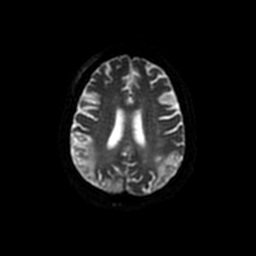
[im 85/102]
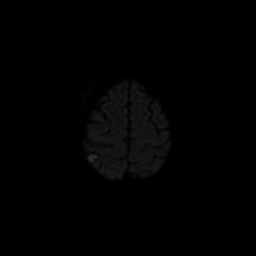
[im 102/102]
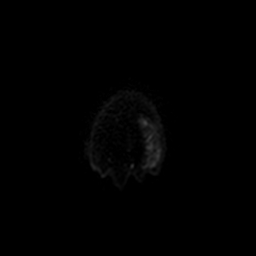

[Series 5: DWI · coronal · 5.0mm · 1.09mm/px · 6 of 72 slices shown (2 of 4)]
[im 1/72]
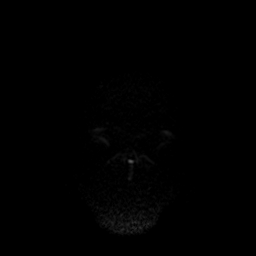
[im 15/72]
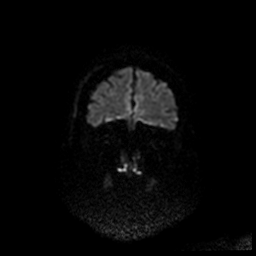
[im 29/72]
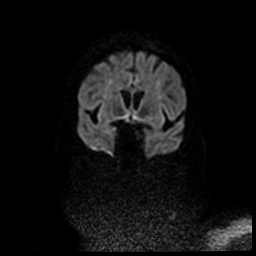
[im 43/72]
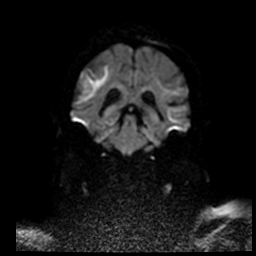
[im 57/72]
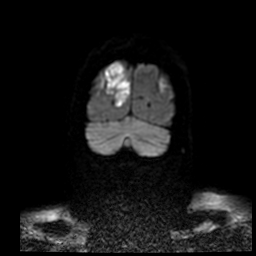
[im 72/72]
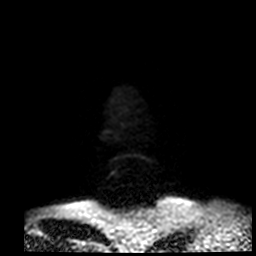

[Series 6: T1 · sagittal · 5.0mm · 0.47mm/px · 2 of 23 slices shown (1 of 2)]
[im 1/23]
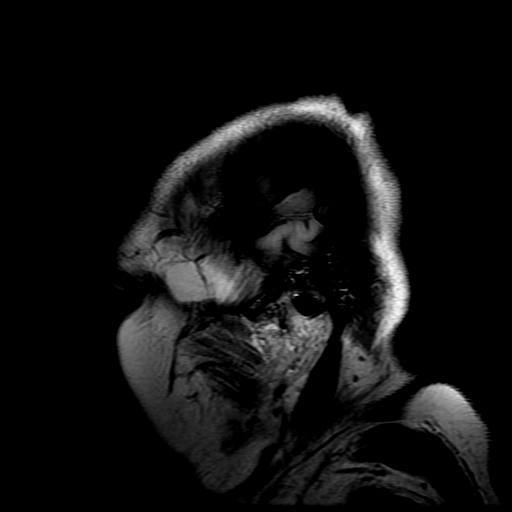
[im 23/23]
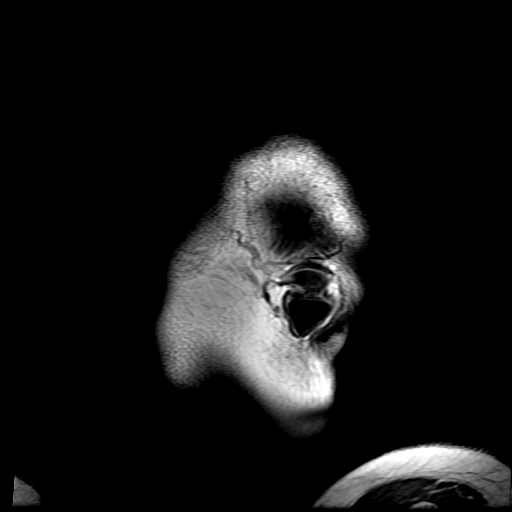

[Series 7: T2 · axial · 5.0mm · 0.43mm/px · z∈[-62,+86]mm · 2 of 26 slices shown (1 of 2)]
[im 1/26]
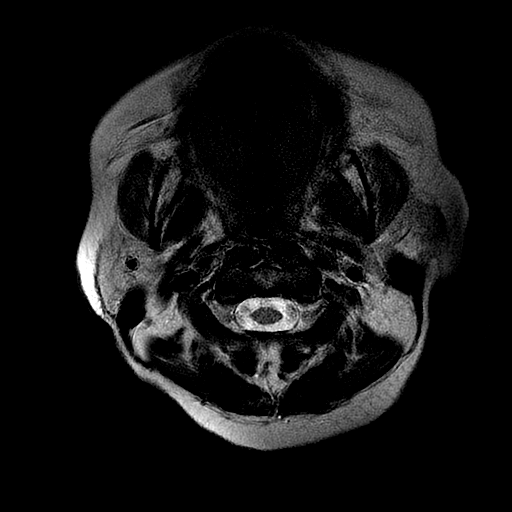
[im 26/26]
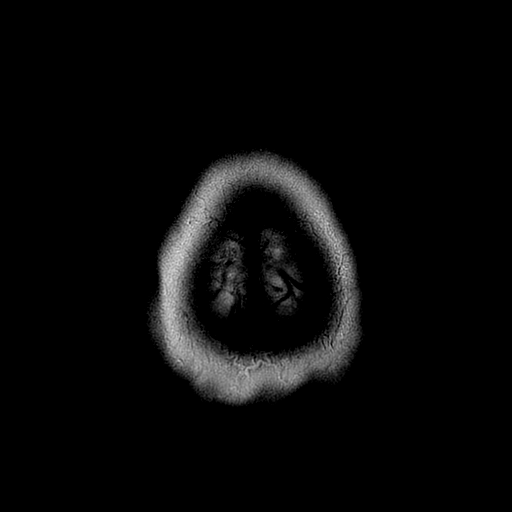

[Series 8: FLAIR · axial · 3.0mm · 0.43mm/px · z∈[-62,+86]mm · 2 of 26 slices shown]
[im 1/26]
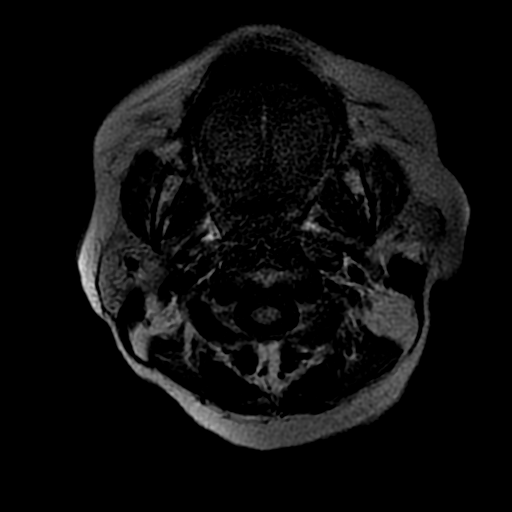
[im 26/26]
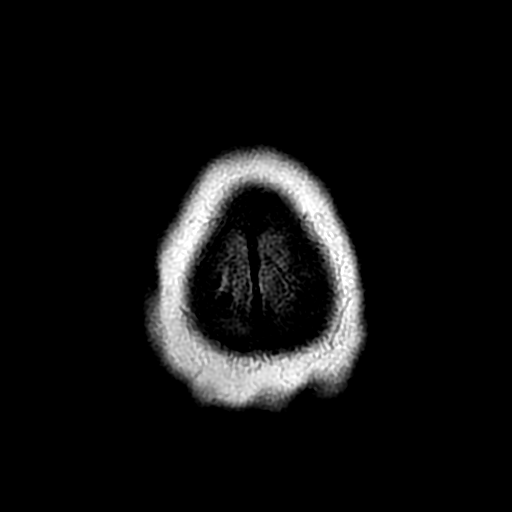

[Series 10: T1 · axial · 3.0mm · 0.43mm/px · z∈[-58,-15]mm · 3 of 104 slices shown (2 of 2)]
[im 1/104]
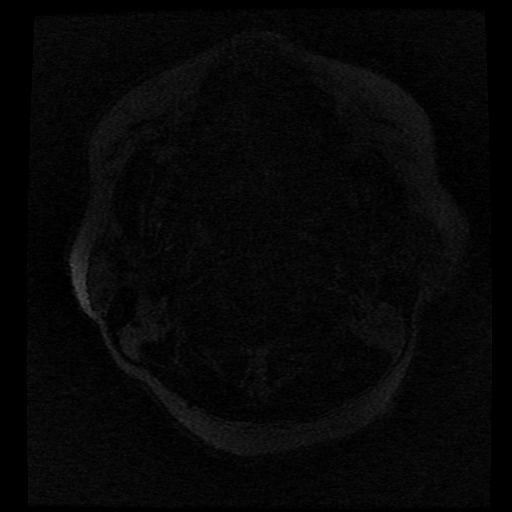
[im 15/104]
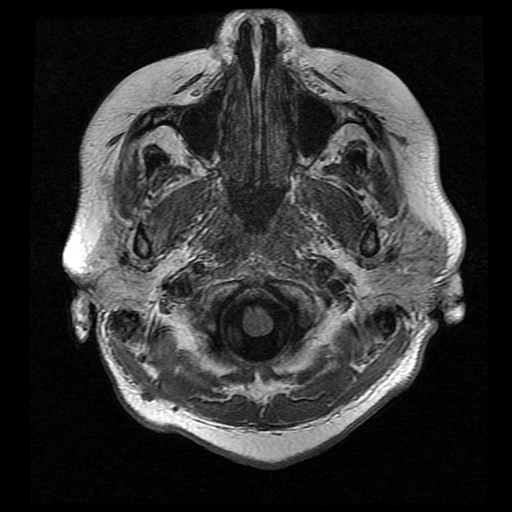
[im 30/104]
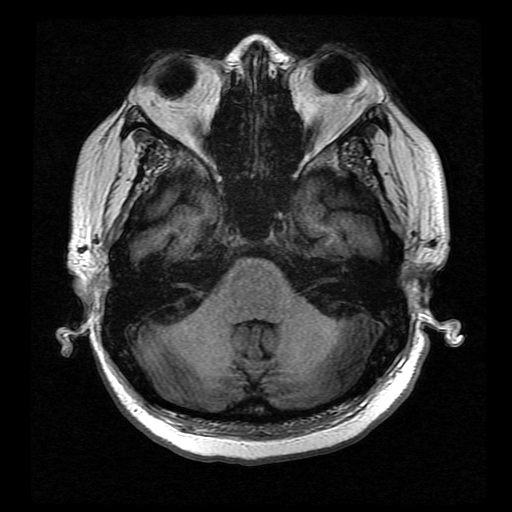

[Series 11: T2 · coronal · 5.0mm · 0.43mm/px · 2 of 24 slices shown (2 of 2)]
[im 1/24]
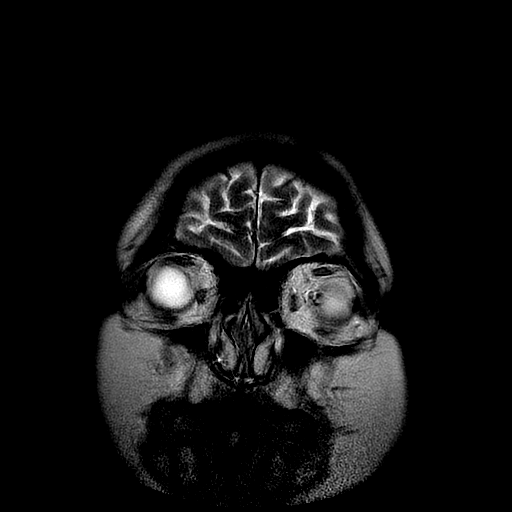
[im 24/24]
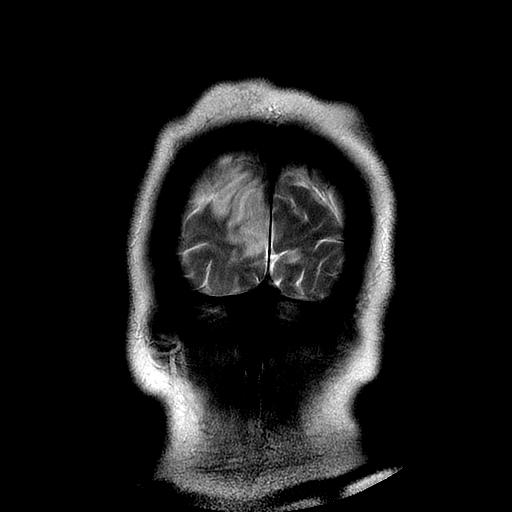

[Series 400: DWI · axial · 3.0mm · 1.09mm/px · z∈[-67,+81]mm · 4 of 51 slices shown (3 of 4)]
[im 1/51]
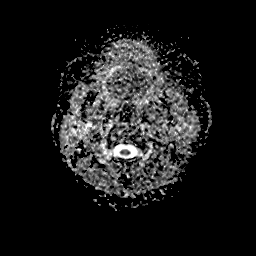
[im 17/51]
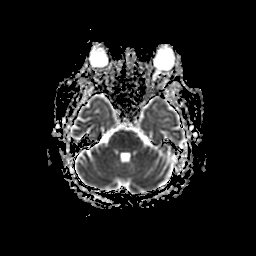
[im 34/51]
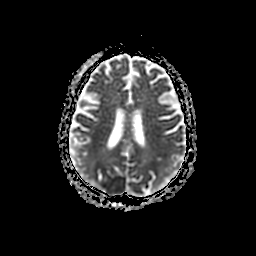
[im 51/51]
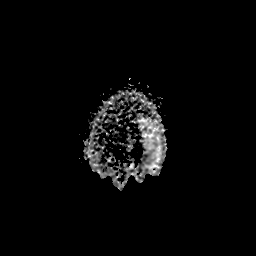

[Series 500: DWI · coronal · 5.0mm · 1.09mm/px · 3 of 34 slices shown (4 of 4)]
[im 1/34]
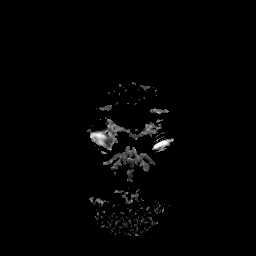
[im 17/34]
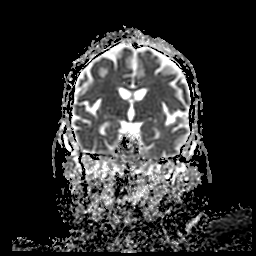
[im 34/34]
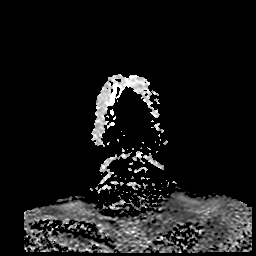

[31 of 48 positions shown; findings below may reference images not displayed]

FINDINGS: MRI HEAD FINDINGS

Brain: Confluent new 5 cm area of restricted diffusion in the
posterior right parietal and superior occipital lobes (series 5,
image 8), corresponding to the CT finding yesterday. This involves
mostly the right MCA/PCA watershed area. There is a ski me a at the
right occipital pole, but the remaining right occipital lobe, and
right thalamus are spared.

Cytotoxic edema with T2 and FLAIR hyperintensity. There is some T1
hypointensity. Trace petechial hemorrhage (series 9, image 16 -
Heidelberg Classification 1a). No significant mass effect.

Superimposed developing encephalomalacia in the bilateral posterior
MCA territories which were acutely infarcted on [DATE]. Mild
residual diffusion abnormality bilaterally. Laminar necrosis and
trace hemosiderin. No mass effect.

No other restricted diffusion. No midline shift, mass effect,
evidence of mass lesion, ventriculomegaly, extra-axial collection.
Cervicomedullary junction and pituitary are within normal limits.
Signal in the deep gray nuclei, brainstem and cerebellum remains
normal.

Vascular: Major intracranial vascular flow voids appear stable since
[REDACTED].

Skull and upper cervical spine: Negative visible cervical spine.
Visualized bone marrow signal is within normal limits.

Sinuses/Orbits: Leftward gaze deviation, otherwise negative orbits.
Paranasal sinuses are clear.

Other: Bilateral mastoid effusions have substantially regressed but
not completely resolved. Negative visible nasopharynx; tiny
retention cyst near midline on the left.

MRA HEAD FINDINGS

Mildly degraded by motion artifact today.

Antegrade flow signal in the posterior circulation appears stable
since [REDACTED], with a persistent left side trigeminal artery
substantially supplying the basilar artery distal to the patent AICA
origins. Dominant distal left vertebral artery. The intervening
basilar segment between the a ICAs and the persistent trigeminal has
not definitely changed.

SCA and PCA origins are patent and appear stable. There is a right
side posterior communicating artery redemonstrated. Left PCA
branches are stable and within normal limits. Right PCA branches are
also largely stable and within normal limits; questionable absence
of a very distal branch today when compared to [DATE] MIP
images, on series 306, image 17. Although that branch might still be
present on the source images.

Stable antegrade flow signal in the anterior circulation. Stable
left side persistent trigeminal artery origin. Mild anterior genu
siphon irregularity bilaterally with associated mild stenosis.
Patent carotid termini. Normal right posterior communicating artery
origin. MCA and ACA origins remain patent. Diminutive or absent
anterior communicating artery. Visible ACA branches are stable and
within normal limits. Both MCA bifurcations remain patent without
stenosis. And visible bilateral MCA branches appear stable since
[REDACTED], within normal limits.
IMPRESSION: 1. Acute to subacute infarct involving some of the Right PCA and the
Right MCA/PCA watershed area as seen by CT yesterday. Trace
petechial blood but no malignant hemorrhagic transformation or mass
effect.

2. Expected evolution of superimposed confluent bilateral posterior
MCA territory infarcts since [DATE]. Stable intracranial MRA since [REDACTED], with no large vessel
occlusion.
Mild ICA siphon atherosclerosis stenosis and Persistent Left side
Trigeminal Artery .

## 2020-03-09 MED ORDER — CHLORHEXIDINE GLUCONATE 0.12 % MT SOLN
15.0000 mL | Freq: Two times a day (BID) | OROMUCOSAL | Status: DC
  Administered 2020-03-09 – 2020-03-21 (×25): 15 mL via OROMUCOSAL
  Filled 2020-03-09 (×25): qty 15

## 2020-03-09 MED ORDER — INSULIN ASPART 100 UNIT/ML ~~LOC~~ SOLN
0.0000 [IU] | Freq: Three times a day (TID) | SUBCUTANEOUS | Status: DC
  Administered 2020-03-09: 8 [IU] via SUBCUTANEOUS
  Administered 2020-03-10: 2 [IU] via SUBCUTANEOUS
  Administered 2020-03-10: 8 [IU] via SUBCUTANEOUS
  Administered 2020-03-10 – 2020-03-11 (×2): 5 [IU] via SUBCUTANEOUS
  Administered 2020-03-11: 3 [IU] via SUBCUTANEOUS
  Administered 2020-03-11: 11 [IU] via SUBCUTANEOUS
  Administered 2020-03-11: 2 [IU] via SUBCUTANEOUS
  Administered 2020-03-12 (×2): 5 [IU] via SUBCUTANEOUS
  Administered 2020-03-12: 2 [IU] via SUBCUTANEOUS
  Administered 2020-03-12: 3 [IU] via SUBCUTANEOUS
  Administered 2020-03-13 (×2): 5 [IU] via SUBCUTANEOUS
  Administered 2020-03-13: 3 [IU] via SUBCUTANEOUS
  Administered 2020-03-13: 5 [IU] via SUBCUTANEOUS
  Administered 2020-03-14: 3 [IU] via SUBCUTANEOUS
  Administered 2020-03-14: 11 [IU] via SUBCUTANEOUS
  Administered 2020-03-14 (×2): 5 [IU] via SUBCUTANEOUS
  Administered 2020-03-15: 8 [IU] via SUBCUTANEOUS
  Administered 2020-03-15: 11 [IU] via SUBCUTANEOUS
  Administered 2020-03-15 (×2): 3 [IU] via SUBCUTANEOUS
  Administered 2020-03-16: 11 [IU] via SUBCUTANEOUS
  Administered 2020-03-16: 5 [IU] via SUBCUTANEOUS
  Administered 2020-03-16 – 2020-03-17 (×3): 3 [IU] via SUBCUTANEOUS
  Administered 2020-03-17 (×3): 5 [IU] via SUBCUTANEOUS
  Administered 2020-03-18: 3 [IU] via SUBCUTANEOUS
  Administered 2020-03-18: 5 [IU] via SUBCUTANEOUS
  Administered 2020-03-18: 3 [IU] via SUBCUTANEOUS
  Administered 2020-03-19 (×2): 2 [IU] via SUBCUTANEOUS
  Administered 2020-03-19: 5 [IU] via SUBCUTANEOUS
  Administered 2020-03-19: 3 [IU] via SUBCUTANEOUS
  Administered 2020-03-20 (×2): 5 [IU] via SUBCUTANEOUS
  Administered 2020-03-20 – 2020-03-21 (×2): 3 [IU] via SUBCUTANEOUS
  Administered 2020-03-21 (×2): 5 [IU] via SUBCUTANEOUS

## 2020-03-09 MED ORDER — ORAL CARE MOUTH RINSE
15.0000 mL | Freq: Two times a day (BID) | OROMUCOSAL | Status: DC
  Administered 2020-03-09 – 2020-03-20 (×15): 15 mL via OROMUCOSAL

## 2020-03-09 MED ORDER — MUPIROCIN 2 % EX OINT
1.0000 "application " | TOPICAL_OINTMENT | Freq: Two times a day (BID) | CUTANEOUS | Status: AC
  Administered 2020-03-09 – 2020-03-13 (×10): 1 via NASAL
  Filled 2020-03-09 (×4): qty 22

## 2020-03-09 MED ORDER — CALCIUM GLUCONATE-NACL 1-0.675 GM/50ML-% IV SOLN
1.0000 g | Freq: Once | INTRAVENOUS | Status: AC
  Administered 2020-03-09: 1000 mg via INTRAVENOUS
  Filled 2020-03-09: qty 50

## 2020-03-09 MED ORDER — CHLORHEXIDINE GLUCONATE CLOTH 2 % EX PADS
6.0000 | MEDICATED_PAD | Freq: Every day | CUTANEOUS | Status: DC
  Administered 2020-03-09 – 2020-03-20 (×9): 6 via TOPICAL

## 2020-03-09 NOTE — Progress Notes (Signed)
Initial Nutrition Assessment  DOCUMENTATION CODES:   Obesity unspecified  INTERVENTION:  Magic cup TID with meals, each supplement provides 290 kcal and 9 grams of protein   NUTRITION DIAGNOSIS:   Increased nutrient needs related to acute illness as evidenced by estimated needs.  GOAL:   Patient will meet greater than or equal to 90% of their needs  MONITOR:   PO intake, Supplement acceptance, Labs, Weight trends  REASON FOR ASSESSMENT:   Consult Assessment of nutrition requirement/status  ASSESSMENT:   59 yo female acute encephalopathy secondary to right PCA territory stroke, potentially acute on chronic changes, potential overdose. PMH includes HTN, HIV, DM, seizure disorder, bipolar disorder, IVDA, recent stroke with dysphagia and PEG tube which was removed several weeks ago.   Per chart review, pt lives with daughter and pt was doing well until pt ran out of 2 of her psych meds and stopped eating and drinking.   Pt is edentulous and does not have dentures; SLP evaluated at bedside today and diet advanced to Dysphagia 1. Pt reports good appetite at present  Pt reports UBW around 180 pounds; current weight 96.1 kg. Appears pt has gained weight since May 2021  Labs: HgbA1c 11.7, CBGs 97-124 Meds: D5-NS at 125 ml/hr  Diet Order:   Diet Order            DIET - DYS 1 Room service appropriate? Yes; Fluid consistency: Thin  Diet effective now                 EDUCATION NEEDS:   Not appropriate for education at this time  Skin:  Skin Assessment: Reviewed RN Assessment  Last BM:  PTA  Height:   Ht Readings from Last 1 Encounters:  11/17/19 5\' 3"  (1.6 m)    Weight:   Wt Readings from Last 1 Encounters:  03/09/20 96.1 kg    Ideal Body Weight:     BMI:  Body mass index is 37.53 kg/m.  Estimated Nutritional Needs:   Kcal:     Protein:     Fluid:      Kerman Passey MS, RDN, LDN, CNSC Registered Dietitian III Clinical Nutrition RD Pager and  On-Call Pager Number Located in Center

## 2020-03-09 NOTE — Progress Notes (Signed)
Received pt from E.D.  Patient lethargic but arousable.  Patient on levo and narcan gtts.  At approx 0100, patient woke up and started yelling.  Upon entering the room with a colleague to see what the patient needed, she began throwing her hands around in an effort to strike myself and my colleague.  Mitts were eventually placed.  NP Simpson asked to complete swallow eval so patient could take her regular meds.  Patient noncompliant and combative.  2mg  haldol given IM as ordered.  Patient became more approachable but refused to sip water after agreeing to drink it.  Patient then screamed that she needed to pee but wouldn't get on bedpan.  After some time, patient went on bedpan but wouldn't come off after voiding.  At this time, patient off bedpan, off levo, and off narcan.  NP Moshe Cipro called and updated.  Will continue to assess

## 2020-03-09 NOTE — Progress Notes (Signed)
NAME:  Susan [name], MRN:  [id number], DOB:  [birth year], LOS: 1 ADMISSION DATE:  03/08/2020, CONSULTATION DATE:  03/08/2020 REFERRING MD:  Dr. Roosevelt Locks, CHIEF COMPLAINT:  Hypotension, AMS  Brief History   20 yoF presenting from home with several days of poor PO intake found to hypotensive and hypoglycemic with AKI. Requiring several doses of narcan now on a narcan drip.  History of present illness   HPI obtained from telephone conversation with patient's daughter, Thayer Headings and per medical chart review as patient continues to be encephalopathic and unable to provide.   59 year old female with prior history of tobacco abuse, HTN, HIV, DM, diabetic neuropathy, seizure disorder, bipolar disease, schizophrenia, IVDA and recent hospitalization from March to Jan 09 2020 with encephalopathy thought secondary to overdose complicated by MRSA/ aspiration pneumonia, pneumococcal bacteremia, septic shock, prolonged respiratory failure requiring tracheostomy and acute vs subacute bilateral MCA infarcts with dysphagia s/p gtube placement.  Tracheostomy was decannulated 4/28.    Per patient's daughter, patient currently lives with her.  She is unclear why her feeding tube was removed 4 -5 weeks ago at the SNF but reports her diet is limited due to having no teeth/ dentures.  She tells me her mother has been doing well until 2 days ago when she ran out of 2 of her psych meds (recalls one being Risperdal which is not on her med list) and since stopped eating and drinking.  She has also been taking nucynta for back pain.  She was found on the floor this morning after they heard her fall out of bed.   Found by EMS to be lethargic, confused, hypotensive and hypoglycemic.  Some concern for left sided neglect and code stroke was activated on arrival to ER.  Her pupils were noted to be pinpoint, therefore given narcan with improvement in mental status.  She was treated for hypoglycemia and fluids with improvement in blood pressure.   Labs noted for AKI.  CT head showed interval right PCA territory stroke, potentially acute on chronic changes, CT cervical negative but incidentally noted right upper lobe infiltrate.  She was started on unasyn and has required 4L [place] and less in ER.  Neurology consulted with recommendations for further stroke workup including MRI/ MRA however was not able to have MRI completed due to ongoing intermittent hypotension.  While in ER, her glucose and blood pressure have been intermittently dropping.  She was placed on fluids with dextrose with stabilization and still has been fluid responsive.  Her mental status has waxed and waned and received several doses of narcan, currently now on a narcan drip.  PCCM called for ICU admit.   Past Medical History  Tobacco abuse, HTN, hypercholesterolemia, HIV, DM, diabetic neuropathy, seizure disorder, bipolar disease, schizophrenia, hx IVDA  Significant Hospital Events   7/6 admitted. Transiently required pressors  7/7 off narcan gtt  Consults:  PCCM  Procedures:   Significant Diagnostic Tests:  7/6 CT cervical >> 1. Mild multilevel degenerative disc disease. No acute abnormality seen in the cervical spine. 2. Right upper lobe airspace opacity is noted concerning for possible pneumonia.  7/6 CTH >> No acute intracranial hemorrhage or significant mass effect. Age-indeterminate right parasagittal occipital infarct with parietal extension. Chronic bilateral MCA territory infarcts.  7/6 Renal US> no obstructive process. mildly increased renal cortical echogenicity bilaterally  MRI/ MRA brain >> TTE >>  Micro Data:  7/6 SARS 2 >> neg 7/6 UC >> 7/6 BCx2 >>  Antimicrobials:  7/6 unasyn >>  Interim history/subjective:  Off narcan gtt Off pressors   Intermittently combative   Objective   Blood pressure (!) 100/56, pulse 67, temperature 98.6 F (37 C), temperature source Oral, resp. rate 12, weight 96.1 kg, SpO2 95 %.        Intake/Output  Summary (Last 24 hours) at 03/09/2020 0806 Last data filed at 03/09/2020 0700 Gross per 24 hour  Intake 4731.33 ml  Output 200 ml  Net 4531.33 ml   Filed Weights   03/09/20 0424  Weight: 96.1 kg    Examination: General:  Chronically ill appearing adult F, in bed NAD HEENT: NCAT,  Pink mmm trachea midline, anicteric sclera  Neuro: Awake alert. 75mm pupils. Following commands intermittently  CV: rrr s1s2 cap refill < 3 seconds  PULM: R sided rhonchi. Symmetrical chest expansion, no accessory muscle use  GI: Obese soft round ndnt Extremities: trace Le edema.  Skin: no rashes  Psych: Fluctuating mood and fluctuating participation in exam   Resolved Hospital Problem list   Hypotension  Assessment & Plan:   Acute encephalopathy secondary to - CT head showed interval right PCA territory stroke, potentially acute on chronic changes - Possible unintentional OD vs build of home narcotics in the setting of AKI + possible recreational use (UDS negative despite endorsed home opioid use -- wonder if recreational fentanyl) - vs metabolic in the setting of sepsis/ hypoglycemia  P:  CVA service following, MRI/A ordered  Swallow study Continue ASA, statin when able to swallow  Off narcan   Hypoxic respiratory failure due to RUL PNA, suspected aspiration PNA Hx dysphagia  -prior PEG P:  NPO until SLP eval Titrate SpO2 for O2 > 92%  Sepsis secondary to RUL PNA P:  Follow culture data Continue unasyn  AKI - likely prerenal in the setting of poor PO intake  Hyponatremia- appears hypovolemic by exam Hypocalcemia P:  Check BMP Continue IVF Give Calcium Glu  HTN P:  Hold home HTN meds in the setting of hypotension  Uncontrolled DMT2 with recurrent hypoglycemic - A1c 11.7 P:  Hold SSI CBG q 4hr or as needed Continue MIVF with dextrose  Hx bipolar/ schizophrenia P:  Holding home meds Will need to clarify what meds she is actually taking  HIV  P:  Resume home antivirals  when able to take meds  Best practice:  Diet: NPO Pain/Anxiety/Delirium protocol (if indicated): n/a VAP protocol (if indicated): n/a DVT prophylaxis: heparin SQ/ SCDs GI prophylaxis: n/a Glucose control: trending Mobility: BR Code Status: full  Family Communication: pending 7/7 Disposition: Likely transfer out of ICU   Labs   CBC: Recent Labs  Lab 03/08/20 1043 03/08/20 1051  WBC 13.1*  --   NEUTROABS 10.0*  --   HGB 14.2 15.0  HCT 44.7 44.0  MCV 87.6  --   PLT PLATELET CLUMPS NOTED ON SMEAR, COUNT APPEARS ADEQUATE  --     Basic Metabolic Panel: Recent Labs  Lab 03/08/20 1043 03/08/20 1051  NA 130* 128*  K 4.0 3.8  CL 88* 87*  CO2 27  --   GLUCOSE 178* 184*  BUN 34* 43*  CREATININE 3.40* 3.50*  CALCIUM 9.1  --    GFR: Estimated Creatinine Clearance: 19.6 mL/min (A) (by C-G formula based on SCr of 3.5 mg/dL (H)). Recent Labs  Lab 03/08/20 1043 03/08/20 1919  WBC 13.1*  --   LATICACIDVEN 3.8* 0.9    Liver Function Tests: Recent Labs  Lab 03/08/20 1043  AST 24  ALT 16  ALKPHOS 99  BILITOT 0.5  PROT 6.9  ALBUMIN 3.2*   No results for input(s): LIPASE, AMYLASE in the last 168 hours. No results for input(s): AMMONIA in the last 168 hours.  ABG    Component Value Date/Time   PHART 7.487 (H) 12/05/2019 0352   PCO2ART 51.0 (H) 12/05/2019 0352   PO2ART 52.0 (L) 12/05/2019 0352   HCO3 38.7 (H) 12/05/2019 0352   TCO2 31 03/08/2020 1051   ACIDBASEDEF 4.0 (H) 11/12/2019 1504   O2SAT 89.0 12/05/2019 0352     Coagulation Profile: No results for input(s): INR, PROTIME in the last 168 hours.  Cardiac Enzymes: No results for input(s): CKTOTAL, CKMB, CKMBINDEX, TROPONINI in the last 168 hours.  HbA1C: Hgb A1c MFr Bld  Date/Time Value Ref Range Status  03/08/2020 07:15 PM 11.7 (H) 4.8 - 5.6 % Final    Comment:    (NOTE) Pre diabetes:          5.7%-6.4%  Diabetes:              >6.4%  Glycemic control for   <7.0% adults with diabetes     11/11/2019 05:38 PM 12.3 (H) 4.8 - 5.6 % Final    Comment:    (NOTE) Pre diabetes:          5.7%-6.4% Diabetes:              >6.4% Glycemic control for   <7.0% adults with diabetes      Eliseo Gum MSN, AGACNP-BC [place] [phone] If no answer, [phone] 03/09/2020, 8:06 AM

## 2020-03-09 NOTE — Progress Notes (Signed)
STROKE TEAM PROGRESS NOTE   INTERVAL HISTORY I personally reviewed history of presenting illness, electronic medical records and imaging films in PACS.  Patient presented with altered mental status in the setting of hypotension, hypoglycemia and suspected narcotic overdose.  She has been treated with Narcan and it showed improvement in mental status as well as of focal neurological deficits previously noted on the left side appear to have improved.  MRI scan of the brain done today shows an acute right occipital and high parietal infarct in addition to previously seen subacute bilateral posterior MCA infarcts. Vitals:   03/09/20 1133 03/09/20 1200 03/09/20 1355 03/09/20 1400  BP:  (!) 147/102 105/60 (!) 92/52  Pulse:  76 72 78  Resp:  17 10 12   Temp:  98 F (36.7 C)    TempSrc:  Oral    SpO2: 95% 92% 98% 98%  Weight:       CBC:  Recent Labs  Lab 03/08/20 1043 03/08/20 1051  WBC 13.1*  --   NEUTROABS 10.0*  --   HGB 14.2 15.0  HCT 44.7 44.0  MCV 87.6  --   PLT PLATELET CLUMPS NOTED ON SMEAR, COUNT APPEARS ADEQUATE  --    Basic Metabolic Panel:  Recent Labs  Lab 03/08/20 1043 03/08/20 1051  NA 130* 128*  K 4.0 3.8  CL 88* 87*  CO2 27  --   GLUCOSE 178* 184*  BUN 34* 43*  CREATININE 3.40* 3.50*  CALCIUM 9.1  --    Lipid Panel: No results for input(s): CHOL, TRIG, HDL, CHOLHDL, VLDL, LDLCALC in the last 168 hours.  HgbA1c:  Recent Labs  Lab 03/08/20 1915  HGBA1C 11.7*   Urine Drug Screen:  Recent Labs  Lab 03/08/20 1306  LABOPIA NONE DETECTED  COCAINSCRNUR NONE DETECTED  LABBENZ NONE DETECTED  AMPHETMU NONE DETECTED  THCU NONE DETECTED  LABBARB NONE DETECTED    Alcohol Level No results for input(s): ETH in the last 168 hours.  IMAGING past 24 hours MR ANGIO HEAD WO CONTRAST  Result Date: 03/09/2020 CLINICAL DATA:  59 year old female code stroke presentation yesterday. No IV tPA given. Age indeterminate right occipital infarct on plain head CT. EXAM: MRI  HEAD WITHOUT CONTRAST MRA HEAD WITHOUT CONTRAST TECHNIQUE: Multiplanar, multiecho pulse sequences of the brain and surrounding structures were obtained without intravenous contrast. Angiographic images of the head were obtained using MRA technique without contrast. COMPARISON:  Head CT yesterday. Brain MRI and intracranial MRA 11/24/2019. FINDINGS: MRI HEAD FINDINGS Brain: Confluent new 5 cm area of restricted diffusion in the posterior right parietal and superior occipital lobes (series 5, image 8), corresponding to the CT finding yesterday. This involves mostly the right MCA/PCA watershed area. There is a ski me a at the right occipital pole, but the remaining right occipital lobe, and right thalamus are spared. Cytotoxic edema with T2 and FLAIR hyperintensity. There is some T1 hypointensity. Trace petechial hemorrhage (series 9, image 16 - Heidelberg Classification 1a). No significant mass effect. Superimposed developing encephalomalacia in the bilateral posterior MCA territories which were acutely infarcted on 11/24/2019. Mild residual diffusion abnormality bilaterally. Laminar necrosis and trace hemosiderin. No mass effect. No other restricted diffusion. No midline shift, mass effect, evidence of mass lesion, ventriculomegaly, extra-axial collection. Cervicomedullary junction and pituitary are within normal limits. Signal in the deep gray nuclei, brainstem and cerebellum remains normal. Vascular: Major intracranial vascular flow voids appear stable since March. Skull and upper cervical spine: Negative visible cervical spine. Visualized bone marrow signal is  within normal limits. Sinuses/Orbits: Leftward gaze deviation, otherwise negative orbits. Paranasal sinuses are clear. Other: Bilateral mastoid effusions have substantially regressed but not completely resolved. Negative visible nasopharynx; tiny retention cyst near midline on the left. MRA HEAD FINDINGS Mildly degraded by motion artifact today. Antegrade  flow signal in the posterior circulation appears stable since March, with a persistent left side trigeminal artery substantially supplying the basilar artery distal to the patent AICA origins. Dominant distal left vertebral artery. The intervening basilar segment between the a ICAs and the persistent trigeminal has not definitely changed. SCA and PCA origins are patent and appear stable. There is a right side posterior communicating artery redemonstrated. Left PCA branches are stable and within normal limits. Right PCA branches are also largely stable and within normal limits; questionable absence of a very distal branch today when compared to 11/24/2019 MIP images, on series 306, image 17. Although that branch might still be present on the source images. Stable antegrade flow signal in the anterior circulation. Stable left side persistent trigeminal artery origin. Mild anterior genu siphon irregularity bilaterally with associated mild stenosis. Patent carotid termini. Normal right posterior communicating artery origin. MCA and ACA origins remain patent. Diminutive or absent anterior communicating artery. Visible ACA branches are stable and within normal limits. Both MCA bifurcations remain patent without stenosis. And visible bilateral MCA branches appear stable since March, within normal limits. IMPRESSION: 1. Acute to subacute infarct involving some of the Right PCA and the Right MCA/PCA watershed area as seen by CT yesterday. Trace petechial blood but no malignant hemorrhagic transformation or mass effect. 2. Expected evolution of superimposed confluent bilateral posterior MCA territory infarcts since March. 3. Stable intracranial MRA since March, with no large vessel occlusion. Mild ICA siphon atherosclerosis stenosis and Persistent Left side Trigeminal Artery . Electronically Signed   By: Genevie Ann M.D.   On: 03/09/2020 13:30   MR BRAIN WO CONTRAST  Result Date: 03/09/2020 CLINICAL DATA:  59 year old female  code stroke presentation yesterday. No IV tPA given. Age indeterminate right occipital infarct on plain head CT. EXAM: MRI HEAD WITHOUT CONTRAST MRA HEAD WITHOUT CONTRAST TECHNIQUE: Multiplanar, multiecho pulse sequences of the brain and surrounding structures were obtained without intravenous contrast. Angiographic images of the head were obtained using MRA technique without contrast. COMPARISON:  Head CT yesterday. Brain MRI and intracranial MRA 11/24/2019. FINDINGS: MRI HEAD FINDINGS Brain: Confluent new 5 cm area of restricted diffusion in the posterior right parietal and superior occipital lobes (series 5, image 8), corresponding to the CT finding yesterday. This involves mostly the right MCA/PCA watershed area. There is a ski me a at the right occipital pole, but the remaining right occipital lobe, and right thalamus are spared. Cytotoxic edema with T2 and FLAIR hyperintensity. There is some T1 hypointensity. Trace petechial hemorrhage (series 9, image 16 - Heidelberg Classification 1a). No significant mass effect. Superimposed developing encephalomalacia in the bilateral posterior MCA territories which were acutely infarcted on 11/24/2019. Mild residual diffusion abnormality bilaterally. Laminar necrosis and trace hemosiderin. No mass effect. No other restricted diffusion. No midline shift, mass effect, evidence of mass lesion, ventriculomegaly, extra-axial collection. Cervicomedullary junction and pituitary are within normal limits. Signal in the deep gray nuclei, brainstem and cerebellum remains normal. Vascular: Major intracranial vascular flow voids appear stable since March. Skull and upper cervical spine: Negative visible cervical spine. Visualized bone marrow signal is within normal limits. Sinuses/Orbits: Leftward gaze deviation, otherwise negative orbits. Paranasal sinuses are clear. Other: Bilateral mastoid effusions have substantially regressed  but not completely resolved. Negative visible  nasopharynx; tiny retention cyst near midline on the left. MRA HEAD FINDINGS Mildly degraded by motion artifact today. Antegrade flow signal in the posterior circulation appears stable since March, with a persistent left side trigeminal artery substantially supplying the basilar artery distal to the patent AICA origins. Dominant distal left vertebral artery. The intervening basilar segment between the a ICAs and the persistent trigeminal has not definitely changed. SCA and PCA origins are patent and appear stable. There is a right side posterior communicating artery redemonstrated. Left PCA branches are stable and within normal limits. Right PCA branches are also largely stable and within normal limits; questionable absence of a very distal branch today when compared to 11/24/2019 MIP images, on series 306, image 17. Although that branch might still be present on the source images. Stable antegrade flow signal in the anterior circulation. Stable left side persistent trigeminal artery origin. Mild anterior genu siphon irregularity bilaterally with associated mild stenosis. Patent carotid termini. Normal right posterior communicating artery origin. MCA and ACA origins remain patent. Diminutive or absent anterior communicating artery. Visible ACA branches are stable and within normal limits. Both MCA bifurcations remain patent without stenosis. And visible bilateral MCA branches appear stable since March, within normal limits. IMPRESSION: 1. Acute to subacute infarct involving some of the Right PCA and the Right MCA/PCA watershed area as seen by CT yesterday. Trace petechial blood but no malignant hemorrhagic transformation or mass effect. 2. Expected evolution of superimposed confluent bilateral posterior MCA territory infarcts since March. 3. Stable intracranial MRA since March, with no large vessel occlusion. Mild ICA siphon atherosclerosis stenosis and Persistent Left side Trigeminal Artery . Electronically Signed    By: Genevie Ann M.D.   On: 03/09/2020 13:30   US RENAL  Result Date: 03/08/2020 CLINICAL DATA:  Acute kidney injury EXAM: RENAL / URINARY TRACT ULTRASOUND COMPLETE COMPARISON:  CT 12/03/2019 FINDINGS: Right Kidney: Renal measurements: 10.2 x 4.8 x 4.9 cm = volume: 125 mL. Mildly increased renal cortical echogenicity. No mass, shadowing stone, or hydronephrosis visualized. Left Kidney: Renal measurements: 10.9 x 5.9 x 6.2 cm = volume: 209 mL. Mildly increased renal cortical echogenicity. No mass, shadowing stone, or hydronephrosis visualized. Bladder: Appears normal for degree of bladder distention. Other: None. IMPRESSION: 1. Negative for obstructive uropathy. 2. Mildly increased renal cortical echogenicity bilaterally suggesting sequela of medical renal disease. Electronically Signed   By: Davina Poke D.O.   On: 03/08/2020 15:57    PHYSICAL EXAM Obese middle-aged African-American lady who is not in distress. . Afebrile. Head is nontraumatic. Neck is supple without bruit.    Cardiac exam no murmur or gallop. Lungs are clear to auscultation. Distal pulses are well felt. Neurological Exam limited due to patient's lack of cooperation and agitation.  She is lying in bed quietly.  She does not follow verbal commands but responds to sternal rub with getting agitated and moving all 4 extremities purposefully and perhaps moving the left arm slightly less in the right.  She does not blink to threat on either side does not follow commands with eye movements or extremities. ASSESSMENT/PLAN Susan [name] is a 59 y.o. female with history of DB, HTN, HLS presenting with altered mental status and fall. Glucose in the 20s, BP low. Found to have L sided neglect and L hemiparesis.   Altered mental status and transient left-sided weakness which appears to have improved after treatment for narcotic overdose, hypotension and hypoglycemia.  Code Stroke CT head  No acute abnormality. Age indeterminate R parasagittal  occipital infarct w/ parietal extension. chronic B MCA infarcts.   MRI  Acute to subacute R PCA and R MCA/PCA watershed infarcts w/ trace petechial hemorrhage. Evolution of B PCA infarcts since March.   MRA  Stable since May - mild ICA siphon atherosclerosis and persistent L trigeminal artery  LDL 52  HgbA1c 11.7  VTE prophylaxis - Heparin 5000 units sq tid   aspirin 81 mg daily prior to admission, now on aspirin 81 mg daily.   Therapy recommendations:  pending   Disposition:  pending   Hypertension  Stable . BP goal normotensive  Hyperlipidemia  Home meds:  crestor 20, resumed in hospital  LDL 52, goal < 70  Continue statin at discharge  Diabetes type II Uncontrolled  HgbA1c 11.7, goal < 7.0  Other Stroke Risk Factors  Cigarette smoker, advised to stop smoking  Hx Substance abuse THC  (daily use) , Heroin - UDS:  THC NONE DETECTED - Off Heroin per chart since 12/2008. Hx Seizure d/t heroin withdrawal in 2010; on Keppra through 01/2011  Obesity, Body mass index is 37.53 kg/m., recommend weight loss, diet and exercise as appropriate. Down from BMI on 40.3 in May   Hx stroke/TIA  11/2019 -  moderate large B posterior MCA territory infarcts, etiology not quite clear. Several potential causes including recurrent hypotension with septic shock, cardiomyopathy, severe infection, uncontrolled risk factors including HIV and DM    Hx prior TIAs without info available  Migraines  Hx cardiomyopathy resolved in May w/ EF 3/11 20-25%->55-60%  HIV  Other Active Problems  Chronic back pain on narcotics   Schizophrenia   Hospital day # 1 She presented with altered mental status likely related to narcotic overdose as well as hypoglycemia and hypotension and transient left-sided weakness with MRI scan showing acute right PCA as well as recent subacute bilateral MCA infarcts etiology cryptogenic.  She had a negative TEE and lower extremity venous Doppler and previous admission  in March for her previous strokes.  Continue aspirin for stroke prevention and aggressive risk factor modification.  Check EEG for seizure activity.  Discussed with critical care nurse practitioner.  Continue ongoing treatment for narcotic overdose, hypotension and hypoglycemia as per critical care team.  Continue current seizure medications.  This patient is critically ill and at significant risk of neurological worsening, death and care requires constant monitoring of vital signs, hemodynamics,respiratory and cardiac monitoring, extensive review of multiple databases, frequent neurological assessment, discussion with family, other specialists and medical decision making of high complexity.I have made any additions or clarifications directly to the above note.This critical care time does not reflect procedure time, or teaching time or supervisory time of PA/NP/Med Resident etc but could involve care discussion time.  I spent 30 minutes of neurocritical care time  in the care of  this patient.    Antony Contras, MD To contact Stroke Continuity provider, please refer to [email or web address] After hours, contact General Neurology

## 2020-03-09 NOTE — Evaluation (Signed)
Physical Therapy Evaluation Patient Details Name: Susan [name] MRN: [id number] DOB: February 16, 1961 Today's Date: 03/09/2020   History of Present Illness  Susan [name] is a 59 year old female with a PMH significant for tobacco abuse, HTN, HIV, DM, diabetic neuropathy, seizure disorder, bipolar disease, schizophrenia, and IVDA. Notably, she was recently hospitalized from March to May 8th, 2021 with encephalopathy secondary to overdose which was complicated by MRSA pneumonia, pneumococcal bacteremia, septic shock, and respiratory failure requiring tracheostomy and G-tube placement. She was decannulated on April 28th, 2021. During her prior hospitalization she was noted to have acute vs subacute bilateral MCA infarcts for which her daugter noticed gradual improvement.  She was found by EMS to be lethargic, confused, hypotensive, and hypoglycemic with pinpoint pupils. She was given several doses of Narcan with improvement in mental status and is currently on a Narcan drip. Her hypoglycemia was treated with oral glucagon and D50. She was noted to have left-sided neglect and left hemiparesis. CT Head showed interval right PCA stroke, potentially acute on chronic changes.  Clinical Impression  Pt admitted with/for AMS and found to have suffered another stroke.  Pt is difficult to assess due to her present mentation and limited cooperation, but not at her baseline per daughter, needing moderate assist at present .  Pt currently limited functionally due to the problems listed. ( See problems list.)   Pt will benefit from PT to maximize function and safety in order to get ready for next venue listed below.     Follow Up Recommendations SNF;Supervision/Assistance - 24 hour;Other (comment) (working toward d/c to home )    Equipment Recommendations  None recommended by PT    Recommendations for Other Services       Precautions / Restrictions Precautions Precautions: Fall Restrictions Weight Bearing  Restrictions: No      Mobility  Bed Mobility Overal bed mobility: Needs Assistance Bed Mobility: Supine to Sit;Sit to Supine     Supine to sit: Min assist Sit to supine: Mod assist   General bed mobility comments: cues for direction and focus.  Assisttransition up to EOB  Transfers Overall transfer level: Needs assistance Equipment used: None Transfers: Sit to/from Stand Sit to Stand: Mod assist (zx2)         General transfer comment: face to face assist to stand at EOB, for peri care and changing bed pads.  Ambulation/Gait             General Gait Details: NT  Stairs            Wheelchair Mobility    Modified Rankin (Stroke Patients Only) Modified Rankin (Stroke Patients Only) Pre-Morbid Rankin Score: Moderate disability Modified Rankin: Severe disability     Balance                                             Pertinent Vitals/Pain Pain Assessment: Faces Faces Pain Scale: No hurt Pain Intervention(s): Monitored during session    Home Living Family/patient expects to be discharged to:: Unsure Living Arrangements: Children;Other (Comment) (lives with dtr in her own room) Available Help at Discharge: Family;Available 24 hours/day (dtr is at home on disability from [place]) Type of Home: House Home Access: Stairs to enter Entrance Stairs-Rails: Psychiatric nurse of Steps: several Home Layout: One level Home Equipment: Walker - 4 wheels;Cane - single point;Tub bench  Prior Function Level of Independence: Needs assistance   Gait / Transfers Assistance Needed: uses rollator in/out and cane sometimes.  Per dtr, was able to walk around the house without assist, to the bathroom.           Hand Dominance        Extremity/Trunk Assessment   Upper Extremity Assessment Upper Extremity Assessment: Defer to OT evaluation    Lower Extremity Assessment Lower Extremity Assessment: Generalized  weakness;Difficult to assess due to impaired cognition       Communication   Communication: No difficulties  Cognition Arousal/Alertness: Awake/alert Behavior During Therapy: Restless Overall Cognitive Status: History of cognitive impairments - at baseline                                        General Comments General comments (skin integrity, edema, etc.): vss    Exercises     Assessment/Plan    PT Assessment Patient needs continued PT services  PT Problem List Decreased strength;Decreased activity tolerance;Decreased balance;Decreased mobility;Decreased cognition;Decreased safety awareness       PT Treatment Interventions DME instruction;Gait training;Functional mobility training;Therapeutic activities;Balance training;Neuromuscular re-education;Patient/family education    PT Goals (Current goals can be found in the Care Plan section)  Acute Rehab PT Goals Patient Stated Goal: dtr wishes pt to attain safe level to go home. PT Goal Formulation: Patient unable to participate in goal setting Time For Goal Achievement: 03/23/20 Potential to Achieve Goals: Fair    Frequency Min 3X/week   Barriers to discharge        Co-evaluation               AM-PAC PT "6 Clicks" Mobility  Outcome Measure Help needed turning from your back to your side while in a flat bed without using bedrails?: A Lot Help needed moving from lying on your back to sitting on the side of a flat bed without using bedrails?: A Lot Help needed moving to and from a bed to a chair (including a wheelchair)?: A Lot Help needed standing up from a chair using your arms (e.g., wheelchair or bedside chair)?: A Lot Help needed to walk in hospital room?: A Lot Help needed climbing 3-5 steps with a railing? : A Lot 6 Click Score: 12    End of Session   Activity Tolerance: Patient tolerated treatment well (limited by mild agitation and decr cognition) Patient left: in bed;with call  bell/phone within reach;with bed alarm set;with nursing/sitter in room;with family/visitor present Nurse Communication: Mobility status PT Visit Diagnosis: Other abnormalities of gait and mobility (R26.89);Difficulty in walking, not elsewhere classified (R26.2);Other symptoms and signs involving the nervous system (R29.898)    Time: 1700-1730 PT Time Calculation (min) (ACUTE ONLY): 30 min   Charges:   PT Evaluation $PT Eval Moderate Complexity: 1 Mod PT Treatments $Therapeutic Activity: 8-22 mins        03/09/2020  Ginger Carne., PT Acute Rehabilitation Services [phone]  (pager) [phone]  (office)  Tessie Fass Dionte [place] 03/09/2020, 6:01 PM
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
## 2020-03-09 NOTE — Progress Notes (Signed)
Pt. Refused blood sugar to be taken. Shirlee Limerick, NP made aware.

## 2020-03-09 NOTE — Progress Notes (Signed)
EEG complete - results pending 

## 2020-03-09 NOTE — TOC CAGE-AID Note (Signed)
Transition of Care [facility]) - CAGE-AID Screening   Patient Details  Name: Susan [name] MRN: [id number] Date of Birth: 1961/03/26  Transition of Care [facility]) CM/SW Contact:    Emeterio Reeve, [place] Phone Number: 03/09/2020, 3:36 PM   Clinical Narrative:  Pt was unable to participate in assessment due to only being oriented to person.   CAGE-AID Screening: Substance Abuse Screening unable to be completed due to: : Patient unable to participate               [place] Clinical Social Worker [phone]

## 2020-03-09 NOTE — Progress Notes (Signed)
Susan [name] [id number] Admission Data: 03/09/2020 6:58 PM Attending Provider: Renee Pain, MD  NGB:MBOMQTTC, Real Cons, MD Consults/ Treatment Team: Treatment Team:  Stroke, Md, MD  Susan [name] is a 59 y.o. female patient admitted from ED awake, alert  & orientated  X 3,  Full Code, VSS - Blood pressure 101/83, pulse 87, temperature 98.2 F (36.8 C), temperature source Oral, resp. rate 15, weight 96.1 kg, SpO2 97 %.,  no c/o shortness of breath, no c/o chest pain, no distress noted. Tele # [phone] placed and pt is currently running:normal sinus rhythm.   IV site WDL:  antecubital right, condition patent and no redness with a transparent dsg that's clean dry and intact.   Pt orientation to unit, room and routine. Information packet given to patient/family and safety video watched.  Admission INP armband ID verified with patient/family, and in place. SR up x 2, fall risk assessment complete with Patient and family verbalizing understanding of risks associated with falls. Pt verbalizes an understanding of how to use the call bell and to call for help before getting out of bed.  Skin, clean-dry- intact without evidence of bruising, or skin tears.   No evidence of skin break down noted on exam.     Will cont to monitor and assist as needed.  Hosie Spangle, [place] 03/09/2020 6:58 PM

## 2020-03-09 NOTE — Progress Notes (Signed)
Phlebotomy unable to obtain labs, patient combative and refusing

## 2020-03-09 NOTE — Procedures (Signed)
ELECTROENCEPHALOGRAM REPORT   Patient: Susan [name]       Room #: [facility] EEG No. ID: [id number] Age: 59 y.o.        Sex: female Requesting Physician: Carson Myrtle Report Date:  03/09/2020        Interpreting Physician: Alexis Goodell  History: Susan [name] is an 59 y.o. female with altered mental status  Medications:  Unasyn, ASA, Biktarvy, Neurontin, Sinequan, Seroquel, Crestor  Conditions of Recording:  This is a 21 channel routine scalp EEG performed with bipolar and monopolar montages arranged in accordance to the international 10/20 system of electrode placement. One channel was dedicated to EKG recording.  The patient is in the awake, drowsy and asleep states.  Description:  Artifact is prominent during the recording often obscuring the background rhythm particularly during wakefulness therefore no waking background activity could be evaluated.  The patient does appear to drowse with a slow background noted consisting of irregular, low voltage delta and theta activity.  The patient goes in to a light sleep with symmetrical sleep spindles, vertex central sharp transients and irregular slow activity.  No epileptiform activity is noted.   Hyperventilation was not performed and intermittent photic stimulation were not performed.  IMPRESSION: This is a technically difficult electroencephalogram secondary to the predominance of artifact.  Wakefulness could not be evaluated.  Normal drowse and sleep appeared to be present.  No epileptiform activity is noted.     Alexis Goodell, MD Neurology [phone] 03/09/2020, 3:43 PM

## 2020-03-10 ENCOUNTER — Inpatient Hospital Stay (HOSPITAL_COMMUNITY): Payer: Medicare Other

## 2020-03-10 DIAGNOSIS — B20 Human immunodeficiency virus [HIV] disease: Secondary | ICD-10-CM

## 2020-03-10 DIAGNOSIS — E119 Type 2 diabetes mellitus without complications: Secondary | ICD-10-CM

## 2020-03-10 HISTORY — PX: IR US GUIDE VASC ACCESS RIGHT: IMG2390

## 2020-03-10 HISTORY — PX: IR FLUORO GUIDE CV LINE RIGHT: IMG2283

## 2020-03-10 LAB — PROCALCITONIN: Procalcitonin: 0.65 ng/mL

## 2020-03-10 LAB — BASIC METABOLIC PANEL
Anion gap: 8 (ref 5–15)
BUN: 14 mg/dL (ref 6–20)
CO2: 27 mmol/L (ref 22–32)
Calcium: 8.6 mg/dL — ABNORMAL LOW (ref 8.9–10.3)
Chloride: 105 mmol/L (ref 98–111)
Creatinine, Ser: 1.22 mg/dL — ABNORMAL HIGH (ref 0.44–1.00)
GFR calc Af Amer: 57 mL/min — ABNORMAL LOW (ref >60)
GFR calc non Af Amer: 49 mL/min — ABNORMAL LOW (ref >60)
Glucose, Bld: 246 mg/dL — ABNORMAL HIGH (ref 70–99)
Potassium: 4.3 mmol/L (ref 3.5–5.1)
Sodium: 140 mmol/L (ref 135–145)

## 2020-03-10 LAB — CBC
HCT: 39.7 % (ref 36.0–46.0)
Hemoglobin: 12.7 g/dL (ref 12.0–15.0)
MCH: 28.1 pg (ref 26.0–34.0)
MCHC: 32 g/dL (ref 30.0–36.0)
MCV: 87.8 fL (ref 80.0–100.0)
Platelets: 289 10*3/uL (ref 150–400)
RBC: 4.52 MIL/uL (ref 3.87–5.11)
RDW: 14.2 % (ref 11.5–15.5)
WBC: 7.1 10*3/uL (ref 4.0–10.5)
nRBC: 0 % (ref 0.0–0.2)

## 2020-03-10 LAB — GLUCOSE, CAPILLARY
Glucose-Capillary: 146 mg/dL — ABNORMAL HIGH (ref 70–99)
Glucose-Capillary: 287 mg/dL — ABNORMAL HIGH (ref 70–99)
Glucose-Capillary: 223 mg/dL — ABNORMAL HIGH (ref 70–99)
Glucose-Capillary: 124 mg/dL — ABNORMAL HIGH (ref 70–99)

## 2020-03-10 IMAGING — XA IR FLUORO GUIDE CV LINE*R*
2 series · 2 of 2 positions shown · non-contrast
Comparison: None.

INDICATION: 58-year-old with multiple medical problems and poor venous access.
Request for central line placement.

EXAM:
PLACEMENT OF CENTRAL LINE WITH ULTRASOUND AND FLUOROSCOPIC GUIDANCE

[Series 1: ir fluoro guide cv line*right* · 1 of 1 slices shown]
[im 1/1]
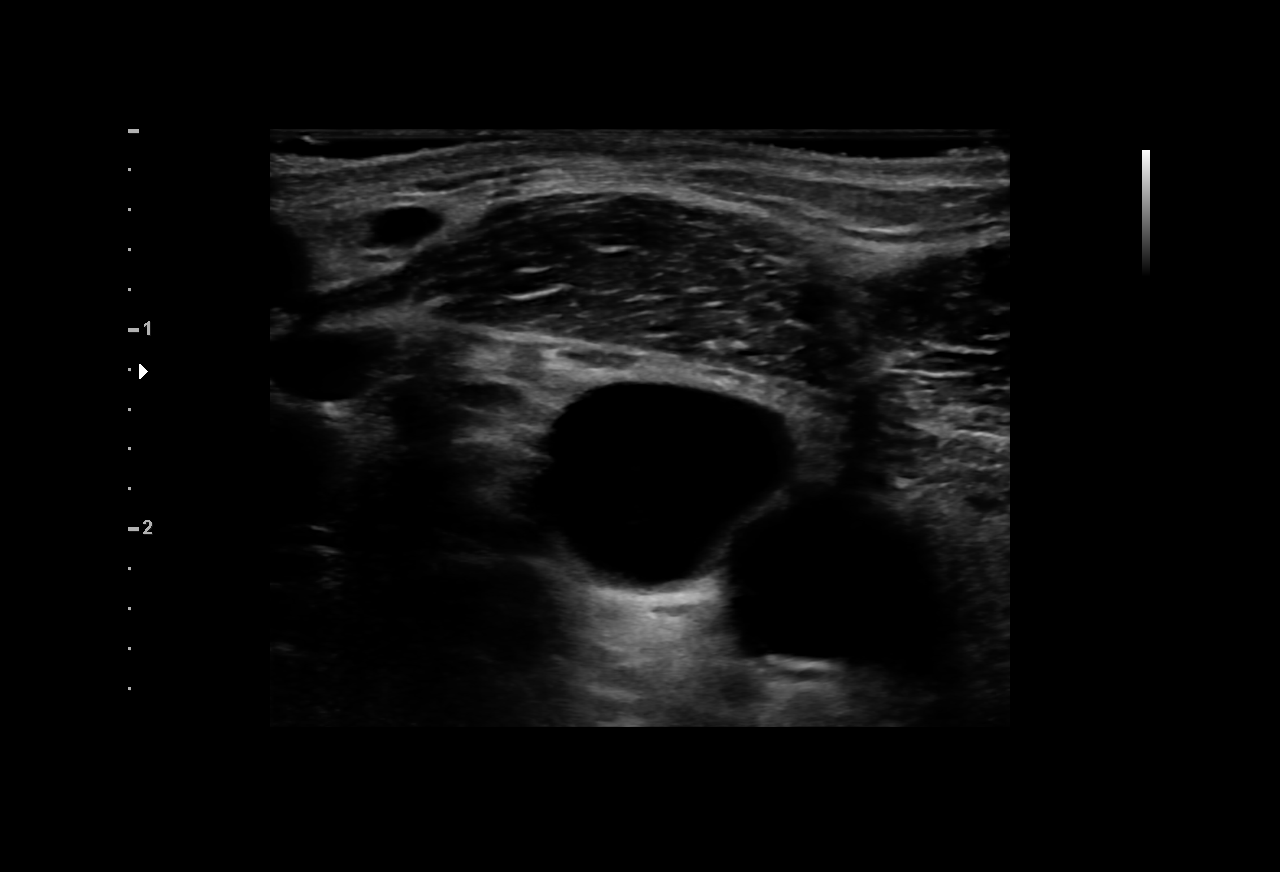

[Series 1: dr (person_name) · 1 of 1 slices shown]
[im 1/1]
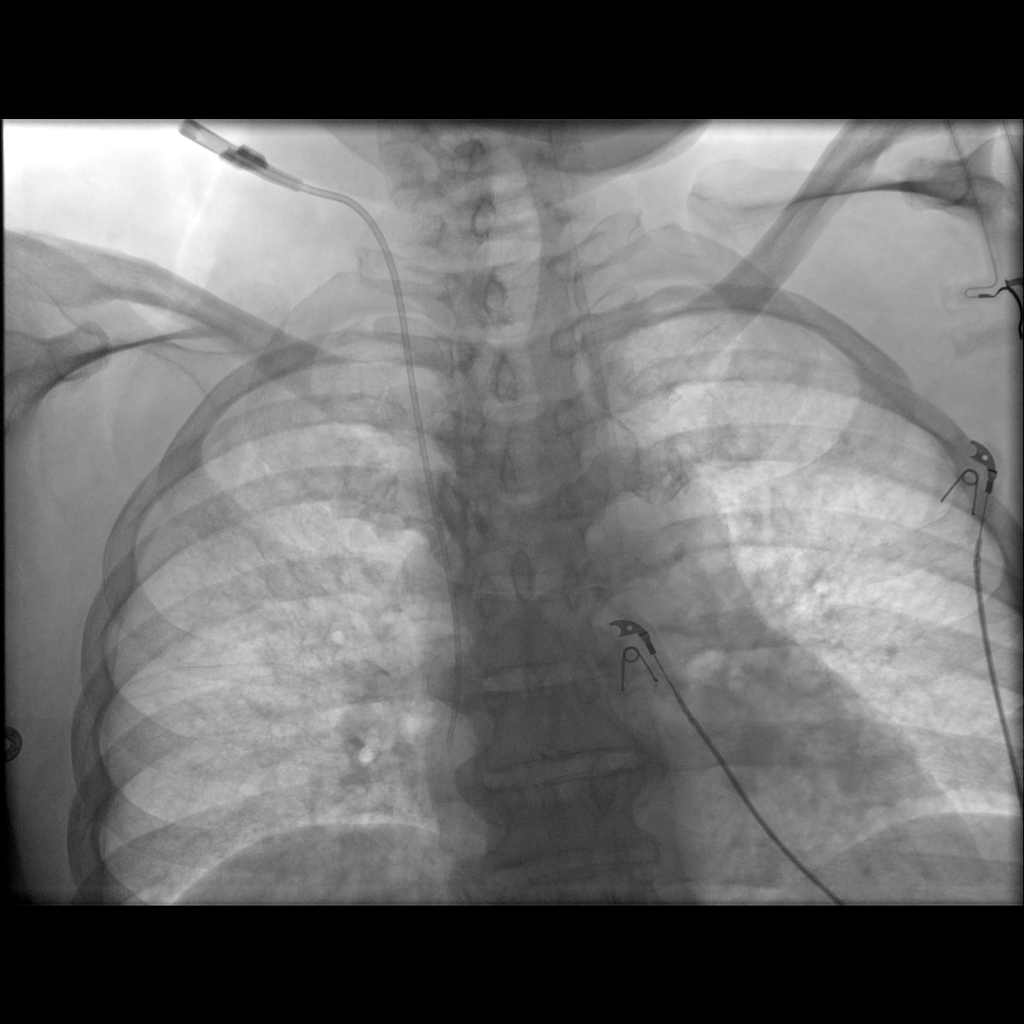

[2 of 2 positions shown; findings below may reference images not displayed]

MEDICATIONS:
None

ANESTHESIA/SEDATION:
None

FLUOROSCOPY TIME:  18 seconds, 1 mGy

COMPLICATIONS:
None immediate.

PROCEDURE:
Informed consent was obtained for central line placement.

Ultrasound confirmed a patent right internal jugular vein.
Ultrasound image was saved for documentation. Right side of the neck
was prepped and draped in sterile fashion. Maximal barrier sterile
technique was utilized including caps, mask, sterile gowns, sterile
gloves, sterile drape, hand hygiene and skin antiseptic. Skin was
anesthetized with 1% lidocaine. Small incision was made. Using
ultrasound guidance, 21 gauge needle was directed into the right
internal jugular vein and wire was advanced centrally. Peel-away
sheath was placed. A dual lumen power PICC line was cut to 15 cm.
Catheter was placed through the peel-away sheath. Catheter tip was
placed at the SVC and right atrium junction. Both lumens aspirated
and flushed well. Both lumens were flushed with saline. Catheter was
sutured to skin and a bandage was placed.

Fluoroscopic and ultrasound images were taken and saved for
documentation.
FINDINGS: Catheter tip at the SVC and right atrium junction.
IMPRESSION: Placement of right jugular central line with ultrasound and
fluoroscopic guidance.

## 2020-03-10 MED ORDER — SODIUM CHLORIDE 0.9 % IV SOLN
3.0000 g | Freq: Three times a day (TID) | INTRAVENOUS | Status: DC
  Administered 2020-03-10 – 2020-03-13 (×10): 3 g via INTRAVENOUS
  Filled 2020-03-10 (×11): qty 8

## 2020-03-10 MED ORDER — LIDOCAINE HCL 1 % IJ SOLN
INTRAMUSCULAR | Status: DC | PRN
  Administered 2020-03-10: 10 mL

## 2020-03-10 MED ORDER — LIDOCAINE HCL 1 % IJ SOLN
INTRAMUSCULAR | Status: AC
  Filled 2020-03-10: qty 20

## 2020-03-10 NOTE — Progress Notes (Signed)
Pharmacy Antibiotic Note  Susan [name] is a 59 y.o. female on day #3 Unasyn for aspiration pneumonia coverage.  AKI on admit so started on low dose Unasyn.  Renal function improved.  Afebrile, WBC improved, cultures negative to date.  Plan:  Increase Unasyn to 3 gm IV q8h.  Follow renal function, culture data, clinical progress and antibiotic plans.  Height: 5\' 3"  (160 cm) Weight: 92.9 kg (204 lb 12.9 oz) IBW/kg (Calculated) : 52.4  Temp (24hrs), Avg:98.2 F (36.8 C), Min:97.7 F (36.5 C), Max:98.5 F (36.9 C)  Recent Labs  Lab 03/08/20 1043 03/08/20 1051 03/08/20 1919 03/10/20 1100  WBC 13.1*  --   --  7.1  CREATININE 3.40* 3.50*  --  1.22*  LATICACIDVEN 3.8*  --  0.9  --     Estimated Creatinine Clearance: 55.1 mL/min (A) (by C-G formula based on SCr of 1.22 mg/dL (H)).    Allergies  Allergen Reactions  . Lyrica [Pregabalin] Swelling    Has LE swelling    Antimicrobials this admission:  Unasyn 7/6 >>  Dose adjustments this admission:  7/8: adjusted Unasyn from 1.5 gm IV q12h to 3 gm IV q8h for improved renal function  Microbiology results:  7/6 COVID:  negative 7/6 Urine: insignificant growth 7/6 Blood x 2: no growth x 2 days to date 7/6 MRSA PCR: positive > on CHG baths and Mupirocin nasal for 5 days  Thank you for allowing pharmacy to be a part of this patient's care.  Arty Baumgartner, [place] Phone: [phone] 03/10/2020 1:28 PM

## 2020-03-10 NOTE — Progress Notes (Signed)
Pt with 5 beats of vtach on tele while in IR. Paged provider. Awaiting further orders. Will continue to monitor pt.

## 2020-03-10 NOTE — Progress Notes (Signed)
PT Cancellation Note  Patient Details Name: Susan [name] MRN: [id number] DOB: August 24, 1961   Cancelled Treatment:    Reason Eval/Treat Not Completed: Other (comment)  RN informed me transport just called and on their way to take pt to radiology to place IJ catheter.    Arby Barrette, PT Pager [phone]   Rexanne Mano 03/10/2020, 1:55 PM

## 2020-03-10 NOTE — Plan of Care (Signed)
°  Problem: Coping: Goal: Ability to adjust to condition or change in health will improve Outcome: Not Progressing   Problem: Fluid Volume: Goal: Ability to maintain a balanced intake and output will improve Outcome: Progressing

## 2020-03-10 NOTE — Progress Notes (Signed)
STROKE TEAM PROGRESS NOTE   INTERVAL HISTORY Is more alert and interactive today but remains slightly confused.  She has slight diminished left peripheral visual loss Vitals:   03/10/20 0400 03/10/20 0800 03/10/20 1150 03/10/20 1300  BP: (!) 121/57 (!) 88/40 (!) 147/104   Pulse: 87  89   Resp: 13 15 12    Temp: 98.2 F (36.8 C)  98.2 F (36.8 C)   TempSrc: Oral  Oral   SpO2: 98%  98%   Weight: 92.9 kg     Height:    5\' 3"  (1.6 m)   CBC:  Recent Labs  Lab 03/08/20 1043 03/08/20 1043 03/08/20 1051 03/10/20 1100  WBC 13.1*  --   --  7.1  NEUTROABS 10.0*  --   --   --   HGB 14.2   < > 15.0 12.7  HCT 44.7   < > 44.0 39.7  MCV 87.6  --   --  87.8  PLT PLATELET CLUMPS NOTED ON SMEAR, COUNT APPEARS ADEQUATE  --   --  289   < > = values in this interval not displayed.   Basic Metabolic Panel:  Recent Labs  Lab 03/08/20 1043 03/08/20 1043 03/08/20 1051 03/10/20 1100  NA 130*   < > 128* 140  K 4.0   < > 3.8 4.3  CL 88*   < > 87* 105  CO2 27  --   --  27  GLUCOSE 178*   < > 184* 246*  BUN 34*   < > 43* 14  CREATININE 3.40*   < > 3.50* 1.22*  CALCIUM 9.1  --   --  8.6*   < > = values in this interval not displayed.   Lipid Panel: No results for input(s): CHOL, TRIG, HDL, CHOLHDL, VLDL, LDLCALC in the last 168 hours.  HgbA1c:  Recent Labs  Lab 03/08/20 1915  HGBA1C 11.7*   Urine Drug Screen:  Recent Labs  Lab 03/08/20 1306  LABOPIA NONE DETECTED  COCAINSCRNUR NONE DETECTED  LABBENZ NONE DETECTED  AMPHETMU NONE DETECTED  THCU NONE DETECTED  LABBARB NONE DETECTED    Alcohol Level No results for input(s): ETH in the last 168 hours.  IMAGING past 24 hours No results found.  PHYSICAL EXAM Obese middle-aged African-American lady who is not in distress. . Afebrile. Head is nontraumatic. Neck is supple without bruit.    Cardiac exam no murmur or gallop. Lungs are clear to auscultation. Distal pulses are well felt. Neurological Exam She is awake alert disoriented  to time place and person.  She has diminished attention, registration and recall.  She follows only simple midline and one-step commands.  Extraocular movements appear full range but there is diminished blink to threat on the left compared to the right.  Mild left lower facial asymmetry.  Tongue midline.  Motor system exam shows symmetric antigravity upper and lower extremity strength but not cooperative for detailed muscle testing.  Gait not tested ASSESSMENT/PLAN [name] is a 59 y.o. female with history of DB, HTN, HLS presenting with altered mental status and fall. Glucose in the 20s, BP low. Found to have L sided neglect and L hemiparesis.   Altered mental status and transient left-sided weakness which appears to have improved after treatment for narcotic overdose, hypotension and hypoglycemia.  Code Stroke CT head No acute abnormality. Age indeterminate R parasagittal occipital infarct w/ parietal extension. chronic B MCA infarcts.   MRI  Acute to subacute R PCA and  R MCA/PCA watershed infarcts w/ trace petechial hemorrhage. Evolution of B PCA infarcts since March.   MRA  Stable since May - mild ICA siphon atherosclerosis and persistent L trigeminal artery  LDL 52  HgbA1c 11.7  VTE prophylaxis - Heparin 5000 units sq tid   aspirin 81 mg daily prior to admission, now on aspirin 81 mg daily.   Therapy recommendations:  pending   Disposition:  pending   Hypertension  Stable . BP goal normotensive  Hyperlipidemia  Home meds:  crestor 20, resumed in hospital  LDL 52, goal < 70  Continue statin at discharge  Diabetes type II Uncontrolled  HgbA1c 11.7, goal < 7.0  Other Stroke Risk Factors  Cigarette smoker, advised to stop smoking  Hx Substance abuse THC  (daily use) , Heroin - UDS:  THC NONE DETECTED - Off Heroin per chart since 12/2008. Hx Seizure d/t heroin withdrawal in 2010; on Keppra through 01/2011  Obesity, Body mass index is 36.28 kg/m., recommend  weight loss, diet and exercise as appropriate. Down from BMI on 40.3 in May   Hx stroke/TIA  11/2019 -  moderate large B posterior MCA territory infarcts, etiology not quite clear. Several potential causes including recurrent hypotension with septic shock, cardiomyopathy, severe infection, uncontrolled risk factors including HIV and DM    Hx prior TIAs without info available  Migraines  Hx cardiomyopathy resolved in May w/ EF 3/11 20-25%->55-60%  HIV  Other Active Problems  Chronic back pain on narcotics   Schizophrenia   Hospital day # 2 She presented with altered mental status likely related to narcotic overdose as well as hypoglycemia and hypotension and transient left-sided weakness with MRI scan showing acute right PCA as well as recent subacute bilateral MCA infarcts etiology cryptogenic.  She had a negative TEE and lower extremity venous Doppler and previous admission in March for her previous strokes.  Continue aspirin for stroke prevention and aggressive risk factor modification.  EGD is suboptimal due to motion artifacts with appears not to show any seizure activity.  Continue ongoing treatment for narcotic overdose, hypotension and hypoglycemia as per critical care team.  Continue current seizure medications.  . Stroke team will sign off.  Kindly call for questions.  Discussed with Dr. Lambert Keto, MD To contact Stroke Continuity provider, please refer to [email or web address] After hours, contact General Neurology

## 2020-03-10 NOTE — Plan of Care (Signed)
°  Problem: Fluid Volume: Goal: Ability to maintain a balanced intake and output will improve Outcome: Progressing   Problem: Education: Goal: Knowledge of secondary prevention will improve Outcome: Not Progressing   Problem: Education: Goal: Knowledge of disease or condition will improve Outcome: Not Progressing

## 2020-03-10 NOTE — Progress Notes (Signed)
Inpatient Diabetes Program Recommendations  AACE/ADA: New Consensus Statement on Inpatient Glycemic Control (2015)  Target Ranges:  Prepandial:   less than 140 mg/dL      Peak postprandial:   less than 180 mg/dL (1-2 hours)      Critically ill patients:  140 - 180 mg/dL   Lab Results  Component Value Date   GLUCAP 287 (H) 03/10/2020   HGBA1C 11.7 (H) 03/08/2020    Review of Glycemic Control Results for Susan [name], Susan [name] (MRN [id number]) as of 03/10/2020 14:26  Ref. Range 03/09/2020 15:27 03/09/2020 18:35 03/09/2020 21:28 03/10/2020 07:56 03/10/2020 11:53  Glucose-Capillary Latest Ref Range: 70 - 99 mg/dL 173 (H) 217 (H) 273 (H) Novolog 8 units 146 (H) 287 (H) Novolog 8 units   Diabetes history: DM Outpatient Diabetes medications: Lantus 30 units qd + Humalog 0-20 tid meal coverage Current orders for Inpatient glycemic control: Novolog correction 0-15 units qid  Inpatient Diabetes Program Recommendations:   -Add 50% of home Lantus 15 units daily -Decrease Novolog correction to sensitive tid + hs 0-5 units -Novolog meal coverage as needed  Thank you, Bethena Roys E. Brynna Dobos, RN, MSN, CDE  Diabetes Coordinator Inpatient Glycemic Control Team Team Pager [phone] (8am-5pm) 03/10/2020 2:29 PM

## 2020-03-10 NOTE — Progress Notes (Signed)
PROGRESS NOTE        PATIENT DETAILS Name: Susan [name] Age: 59 y.o. Sex: female Date of Birth: [birth year] Admit Date: 03/08/2020 Admitting Physician Renee Pain, MD BPZ:[id number], Real Cons, MD  Brief Narrative: Patient is a 59 y.o. female history of HTN, HIV, DM, seizure disorder, bipolar disorder, prolonged hospitalization from 3/10-5/8 for respiratory failure/encephalopathy/septic shock/CVA-required intubation-difficult to wean off ventilator-tracheostomy/PEG tube placement-she was eventually decannulated on 4/28-discharged to SNF-presented to the hospital on 7/6 for altered mental status, hypoglycemia/hypotension-thought to be multifactorial-from unintentional narcotic overdose-she was transferred to the ICU-required Narcan infusion-upon stability-she was transferred to [facility] service.  MRI of the brain done this admission was positive for acute/subacute infarct involving the right PCA/right MCA watershed area.  Significant events: 7/6>> presented to [facility] with confusion/hypoglycemia/hypotension found to have AKI-requiring several doses of Narcan-eventually Narcan drip for presumed unintentional narcotic overdose. 7/8>> transfer to [facility] from ICU.  Significant studies: 3/16>> TEE: EF 55-60% 3/23>> daughter Doppler: No significant stenosis. 3/23>> lower extremity Doppler: No DVT. 7/6>> chest x-ray: Low volume chest with indistinct opacity-that could be scarring/active infection 7/6>> CT head: Age-indeterminate right parasagittal occipital infarct with bilateral extension. 7/6>> CT C-spine: No acute abnormality noted in the C-spine 7/6>> renal ultrasound: Negative for obstructive uropathy 7/7>> MRI brain: Acute to subacute infarct involving the right PCA and right MCA/PCA watershed area 7/7>> MRI brain: Stable intracranial MRA since March-with no large vessel occlusion. 7/7>> EEG: No seizures.  Antimicrobial therapy: Unasyn: 7/6>>  Microbiology data: 7/6>>  urine culture:<10,000 colonies/mL insignificant growth 7/6>> blood culture: No growth  Procedures : None  Consults: PCCM Neurology  DVT Prophylaxis : SCDs Start: 03/08/20 2005 heparin injection 5,000 Units Start: 03/08/20 1500   Subjective: Awake/alert-following commands.  Answers some questions appropriately.  Assessment/Plan: Acute toxic metabolic encephalopathy: Multifactorial-likely narcotic overdose, hypoglycemia, AKI and sepsis physiology all contributing.  Remarkably better after treatment of underlying conditions.  Suspected narcotic overdose: Although urine drug screen negative-she responded well Per PCCM note with Narcan infusion.  Per H&P-patient apparently was started on Nucynta 1 day prior to this hospital stay-interestingly-not listed on home med list-urine drug screen negative as well.  Avoid narcotics as much as possible.  Hypoglycemia: Suspect likely secondary to poor oral intake in the setting of confusion-resolved-CBGs stable with SSI.  Sepsis acute hypoxic respiratory failure likely secondary to aspiration pneumonia: Sepsis physiology has resolved-blood cultures negative-continue Unasyn.  Attempt to titrate off oxygen.  AKI: Likely hemodynamically mediated-repeat electrolytes pending this morning.  Renal ultrasound negative for hydronephrosis.  Acute CVA: Apparently had transient left-sided weakness that has resolved-see imaging above-LDL 52, A1c 11.7-had stroke work-up during her most recent admission-neurology recommending aspirin-awaiting EEG.  HIV: Patient CD4 count on 6/2 882-continue antiretrovirals.  HTN: Blood pressure controlled-not on any antihypertensives.  DM-2 (A1c 11.7 on 7/6): Had hypoglycemia during presentation-CBGs currently stable with just exercise-should be able to add low-dose Lantus over the next few days-if her diet remains stable.  Recent Labs    03/09/20 1835 03/09/20 2128 03/10/20 0756  GLUCAP 217* 273* 146*   Bipolar  disorder/schizophrenia: Remains stable-continue Seroquel, doxepin  Dysphagia: Recently had PEG tube removed-seen by SLP-on dysphagia 1 diet.  Follow.  History of substance use: UDS positive for cannabinoids-suspected to have unintentional narcotic overdose on presentation.  We will continue to counsel going forward.  Other issues: Unable to do lab work per [employer] due  to patient being a hard stick-we will go and place order for central access.  Nutrition Problem: Nutrition Problem: Increased nutrient needs Etiology: acute illness Signs/Symptoms: estimated needs Interventions: Magic cup  Obesity: Estimated body mass index is 36.28 kg/m as calculated from the following:   Height as of 11/17/19: 5\' 3"  (1.6 m).   Weight as of this encounter: 92.9 kg.    Diet: Diet Order            DIET - DYS 1 Room service appropriate? Yes; Fluid consistency: Thin  Diet effective now                  Code Status: Full code   Family Communication: None at bedside-we will update over the next few days.  Disposition Plan: Status is: Inpatient  Remains inpatient appropriate because:Inpatient level of care appropriate due to severity of illness   Dispo: The patient is from: Home              Anticipated d/c is to: TBD              Anticipated d/c date is: 2 days              Patient currently is not medically stable to d/c.   Barriers to Discharge: Resolving encephalopathy-on IV Unasyn for aspiration pneumonia-AKI-not yet back to baseline.  Antimicrobial agents: Anti-infectives (From admission, onward)   Start     Dose/Rate Route Frequency Ordered Stop   03/09/20 0600  ampicillin-sulbactam (UNASYN) 1.5 g in sodium chloride 0.9 % 100 mL IVPB     Discontinue     1.5 g 200 mL/hr over 30 Minutes Intravenous Every 12 hours 03/08/20 1716     03/08/20 1930  bictegravir-emtricitabine-tenofovir AF (BIKTARVY) 50-200-25 MG per tablet 1 tablet     Discontinue     1 tablet Oral Daily 03/08/20 1453      03/08/20 1515  Ampicillin-Sulbactam (UNASYN) 3 g in sodium chloride 0.9 % 100 mL IVPB        3 g 200 mL/hr over 30 Minutes Intravenous  Once 03/08/20 1500 03/08/20 2126       Time spent: 25- minutes-Greater than 50% of this time was spent in counseling, explanation of diagnosis, planning of further management, and coordination of care.  MEDICATIONS: Scheduled Meds: . aspirin  81 mg Oral Daily  . bictegravir-emtricitabine-tenofovir AF  1 tablet Oral Daily  . chlorhexidine  15 mL Mouth Rinse BID  . Chlorhexidine Gluconate Cloth  6 each Topical Daily  . doxepin  50 mg Oral QHS  . gabapentin  100 mg Oral BID  . heparin  5,000 Units Subcutaneous Q8H  . insulin aspart  0-15 Units Subcutaneous TID AC & HS  . mouth rinse  15 mL Mouth Rinse q12n4p  . mupirocin ointment  1 application Nasal BID  . pantoprazole  40 mg Oral Daily  . QUEtiapine  200 mg Oral BID  . rosuvastatin  20 mg Oral Daily  . umeclidinium-vilanterol  1 puff Inhalation Daily   Continuous Infusions: . sodium chloride Stopped (03/09/20 1213)  . ampicillin-sulbactam (UNASYN) IV 1.5 g (03/10/20 0601)   PRN Meds:.acetaminophen, albuterol, docusate sodium, glucagon (human recombinant), haloperidol lactate, hydrALAZINE, polyethylene glycol   PHYSICAL EXAM: Vital signs: Vitals:   03/09/20 2000 03/10/20 0000 03/10/20 0400 03/10/20 0800  BP: 127/68 115/73 (!) 121/57 (!) 88/40  Pulse: 73 83 87   Resp: 13 14 13 15   Temp: 98.4 F (36.9 C) 98.5 F (36.9 C)  98.2 F (36.8 C)   TempSrc: Oral Oral Oral   SpO2: 97% 97% 98%   Weight:   92.9 kg    Filed Weights   03/09/20 0424 03/10/20 0400  Weight: 96.1 kg 92.9 kg   Body mass index is 36.28 kg/m.   Gen Exam:Alert awake-not in any distress HEENT:atraumatic, normocephalic Chest: B/L clear to auscultation anteriorly CVS:S1S2 regular Abdomen:soft non tender, non distended Extremities:no edema Neurology: Non focal Skin: no rash  I have personally reviewed following  labs and imaging studies  LABORATORY DATA: CBC: Recent Labs  Lab 03/08/20 1043 03/08/20 1051  WBC 13.1*  --   NEUTROABS 10.0*  --   HGB 14.2 15.0  HCT 44.7 44.0  MCV 87.6  --   PLT PLATELET CLUMPS NOTED ON SMEAR, COUNT APPEARS ADEQUATE  --     Basic Metabolic Panel: Recent Labs  Lab 03/08/20 1043 03/08/20 1051  NA 130* 128*  K 4.0 3.8  CL 88* 87*  CO2 27  --   GLUCOSE 178* 184*  BUN 34* 43*  CREATININE 3.40* 3.50*  CALCIUM 9.1  --     GFR: Estimated Creatinine Clearance: 19.2 mL/min (A) (by C-G formula based on SCr of 3.5 mg/dL (H)).  Liver Function Tests: Recent Labs  Lab 03/08/20 1043  AST 24  ALT 16  ALKPHOS 99  BILITOT 0.5  PROT 6.9  ALBUMIN 3.2*   No results for input(s): LIPASE, AMYLASE in the last 168 hours. No results for input(s): AMMONIA in the last 168 hours.  Coagulation Profile: No results for input(s): INR, PROTIME in the last 168 hours.  Cardiac Enzymes: No results for input(s): CKTOTAL, CKMB, CKMBINDEX, TROPONINI in the last 168 hours.  BNP (last 3 results) No results for input(s): PROBNP in the last 8760 hours.  Lipid Profile: No results for input(s): CHOL, HDL, LDLCALC, TRIG, CHOLHDL, LDLDIRECT in the last 72 hours.  Thyroid Function Tests: No results for input(s): TSH, T4TOTAL, FREET4, T3FREE, THYROIDAB in the last 72 hours.  Anemia Panel: No results for input(s): VITAMINB12, FOLATE, FERRITIN, TIBC, IRON, RETICCTPCT in the last 72 hours.  Urine analysis:    Component Value Date/Time   COLORURINE STRAW (A) 03/08/2020 1306   APPEARANCEUR CLEAR 03/08/2020 1306   LABSPEC 1.022 03/08/2020 1306   PHURINE 5.0 03/08/2020 1306   GLUCOSEU >=500 (A) 03/08/2020 1306   HGBUR NEGATIVE 03/08/2020 1306   BILIRUBINUR NEGATIVE 03/08/2020 1306   KETONESUR NEGATIVE 03/08/2020 1306   PROTEINUR NEGATIVE 03/08/2020 1306   UROBILINOGEN 1.0 06/01/2015 0104   NITRITE NEGATIVE 03/08/2020 1306   LEUKOCYTESUR NEGATIVE 03/08/2020 1306    Sepsis  Labs: Lactic Acid, Venous    Component Value Date/Time   LATICACIDVEN 0.9 03/08/2020 1919    MICROBIOLOGY: Recent Results (from the past 240 hour(s))  SARS Coronavirus 2 by RT PCR (hospital order, performed in [place] hospital lab) Nasopharyngeal Nasopharyngeal Swab     Status: None   Collection Time: 03/08/20 11:26 AM   Specimen: Nasopharyngeal Swab  Result Value Ref Range Status   SARS Coronavirus 2 NEGATIVE NEGATIVE Final    Comment: (NOTE) SARS-CoV-2 target nucleic acids are NOT DETECTED.  The SARS-CoV-2 RNA is generally detectable in upper and lower respiratory specimens during the acute phase of infection. The lowest concentration of SARS-CoV-2 viral copies this assay can detect is 250 copies / mL. A negative result does not preclude SARS-CoV-2 infection and should not be used as the sole basis for treatment or other patient management decisions.  A negative  result may occur with improper specimen collection / handling, submission of specimen other than nasopharyngeal swab, presence of viral mutation(s) within the areas targeted by this assay, and inadequate number of viral copies (<250 copies / mL). A negative result must be combined with clinical observations, patient history, and epidemiological information.  Fact Sheet for Patients:   StrictlyIdeas.no  Fact Sheet for Healthcare Providers: BankingDealers.co.za  This test is not yet approved or  cleared by the [place] FDA and has been authorized for detection and/or diagnosis of SARS-CoV-2 by FDA under an Emergency Use Authorization (EUA).  This EUA will remain in effect (meaning this test can be used) for the duration of the COVID-19 declaration under Section 564(b)(1) of the Act, 21 U.S.C. section 360bbb-3(b)(1), unless the authorization is terminated or revoked sooner.  Performed at [facility], [place] [address] [place], [place] [address]   Urine  culture     Status: Abnormal   Collection Time: 03/08/20  1:16 PM   Specimen: Urine, Random  Result Value Ref Range Status   Specimen Description URINE, RANDOM  Final   Special Requests NONE  Final   Culture (A)  Final    <10,000 COLONIES/mL INSIGNIFICANT GROWTH Performed at [facility], [place] [address], [place], [place] [address]    Report Status 03/09/2020 FINAL  Final  Culture, blood (routine x 2)     Status: None (Preliminary result)   Collection Time: 03/08/20  1:36 PM   Specimen: BLOOD  Result Value Ref Range Status   Specimen Description BLOOD BLOOD LEFT HAND  Final   Special Requests   Final    BOTTLES DRAWN AEROBIC ONLY Blood Culture results may not be optimal due to an inadequate volume of blood received in culture bottles   Culture   Final    NO GROWTH < 24 HOURS Performed at [facility], [place] [address] [place], [place] [address]    Report Status PENDING  Incomplete  Culture, blood (routine x 2)     Status: None (Preliminary result)   Collection Time: 03/08/20  7:19 PM   Specimen: BLOOD LEFT FOREARM  Result Value Ref Range Status   Specimen Description BLOOD LEFT FOREARM  Final   Special Requests AEROBIC BOTTLE ONLY Blood Culture adequate volume  Final   Culture   Final    NO GROWTH < 24 HOURS Performed at [facility], [place] [address], [place], [place] [address]    Report Status PENDING  Incomplete  MRSA PCR Screening     Status: Abnormal   Collection Time: 03/09/20  9:41 AM   Specimen: Nasal Mucosa; Nasopharyngeal  Result Value Ref Range Status   MRSA by PCR POSITIVE (A) NEGATIVE Final    Comment:        The GeneXpert MRSA Assay (FDA approved for NASAL specimens only), is one component of a comprehensive MRSA colonization surveillance program. It is not intended to diagnose MRSA infection nor to guide or monitor treatment for MRSA infections. CRITICAL RESULT CALLED TO, READ BACK BY AND VERIFIED WITH: RN Maggie Font AT 1121 03/09/20 BY  MM Performed at [facility], [place] [address] [place],  [address]     RADIOLOGY STUDIES/RESULTS: CT Cervical Spine Wo Contrast  Result Date: 03/08/2020 CLINICAL DATA:  Trauma. EXAM: CT CERVICAL SPINE WITHOUT CONTRAST TECHNIQUE: Multidetector CT imaging of the cervical spine was performed without intravenous contrast. Multiplanar CT image reconstructions were also generated. COMPARISON:  None. FINDINGS: Alignment:  Normal. Skull base and vertebrae: No acute fracture. No primary bone lesion or focal pathologic process. Soft tissues and spinal canal: No prevertebral fluid or swelling. No visible canal hematoma. Disc levels: Mild degenerative disc disease is noted at C4-5, C5-6 and C6-7. Upper chest: Right upper lobe airspace opacity is noted concerning for possible pneumonia. Other: None. IMPRESSION: 1. Mild multilevel degenerative disc disease. No acute abnormality seen in the cervical spine. 2. Right upper lobe airspace opacity is noted concerning for possible pneumonia. Electronically Signed   By: Marijo Conception M.D.   On: 03/08/2020 12:26   MR ANGIO HEAD WO CONTRAST  Result Date: 03/09/2020 CLINICAL DATA:  59 year old female code stroke presentation yesterday. No IV tPA given. Age indeterminate right occipital infarct on plain head CT. EXAM: MRI HEAD WITHOUT CONTRAST MRA HEAD WITHOUT CONTRAST TECHNIQUE: Multiplanar, multiecho pulse sequences of the brain and surrounding structures were obtained without intravenous contrast. Angiographic images of the head were obtained using MRA technique without contrast. COMPARISON:  Head CT yesterday. Brain MRI and intracranial MRA 11/24/2019. FINDINGS: MRI HEAD FINDINGS Brain: Confluent new 5 cm area of restricted diffusion in the posterior right parietal and superior occipital lobes (series 5, image 8), corresponding to the CT finding yesterday. This involves mostly the right MCA/PCA watershed area. There is a ski me a at the right occipital pole, but  the remaining right occipital lobe, and right thalamus are spared. Cytotoxic edema with T2 and FLAIR hyperintensity. There is some T1 hypointensity. Trace petechial hemorrhage (series 9, image 16 - Heidelberg Classification 1a). No significant mass effect. Superimposed developing encephalomalacia in the bilateral posterior MCA territories which were acutely infarcted on 11/24/2019. Mild residual diffusion abnormality bilaterally. Laminar necrosis and trace hemosiderin. No mass effect. No other restricted diffusion. No midline shift, mass effect, evidence of mass lesion, ventriculomegaly, extra-axial collection. Cervicomedullary junction and pituitary are within normal limits. Signal in the deep gray nuclei, brainstem and cerebellum remains normal. Vascular: Major intracranial vascular flow voids appear stable since March. Skull and upper cervical spine: Negative visible cervical spine. Visualized bone marrow signal is within normal limits. Sinuses/Orbits: Leftward gaze deviation, otherwise negative orbits. Paranasal sinuses are clear. Other: Bilateral mastoid effusions have substantially regressed but not completely resolved. Negative visible nasopharynx; tiny retention cyst near midline on the left. MRA HEAD FINDINGS Mildly degraded by motion artifact today. Antegrade flow signal in the posterior circulation appears stable since March, with a persistent left side trigeminal artery substantially supplying the basilar artery distal to the patent AICA origins. Dominant distal left vertebral artery. The intervening basilar segment between the a ICAs and the persistent trigeminal has not definitely changed. SCA and PCA origins are patent and appear stable. There is a right side posterior communicating artery redemonstrated. Left PCA branches are stable and within normal limits. Right PCA branches are also largely stable and within normal limits; questionable absence of a very distal branch today when compared to  11/24/2019 MIP images, on series 306, image 17. Although that branch might still be present on the source images. Stable antegrade flow signal in the anterior circulation. Stable left side persistent trigeminal artery origin. Mild anterior genu siphon irregularity bilaterally with associated mild stenosis. Patent carotid termini. Normal right posterior communicating artery origin. MCA and ACA origins remain patent. Diminutive or absent anterior communicating artery. Visible ACA branches are stable and within normal limits. Both MCA bifurcations remain patent without stenosis. And visible bilateral MCA branches appear stable since March, within normal limits. IMPRESSION: 1. Acute to  subacute infarct involving some of the Right PCA and the Right MCA/PCA watershed area as seen by CT yesterday. Trace petechial blood but no malignant hemorrhagic transformation or mass effect. 2. Expected evolution of superimposed confluent bilateral posterior MCA territory infarcts since March. 3. Stable intracranial MRA since March, with no large vessel occlusion. Mild ICA siphon atherosclerosis stenosis and Persistent Left side Trigeminal Artery . Electronically Signed   By: Genevie Ann M.D.   On: 03/09/2020 13:30   MR BRAIN WO CONTRAST  Result Date: 03/09/2020 CLINICAL DATA:  59 year old female code stroke presentation yesterday. No IV tPA given. Age indeterminate right occipital infarct on plain head CT. EXAM: MRI HEAD WITHOUT CONTRAST MRA HEAD WITHOUT CONTRAST TECHNIQUE: Multiplanar, multiecho pulse sequences of the brain and surrounding structures were obtained without intravenous contrast. Angiographic images of the head were obtained using MRA technique without contrast. COMPARISON:  Head CT yesterday. Brain MRI and intracranial MRA 11/24/2019. FINDINGS: MRI HEAD FINDINGS Brain: Confluent new 5 cm area of restricted diffusion in the posterior right parietal and superior occipital lobes (series 5, image 8), corresponding to the CT  finding yesterday. This involves mostly the right MCA/PCA watershed area. There is a ski me a at the right occipital pole, but the remaining right occipital lobe, and right thalamus are spared. Cytotoxic edema with T2 and FLAIR hyperintensity. There is some T1 hypointensity. Trace petechial hemorrhage (series 9, image 16 - Heidelberg Classification 1a). No significant mass effect. Superimposed developing encephalomalacia in the bilateral posterior MCA territories which were acutely infarcted on 11/24/2019. Mild residual diffusion abnormality bilaterally. Laminar necrosis and trace hemosiderin. No mass effect. No other restricted diffusion. No midline shift, mass effect, evidence of mass lesion, ventriculomegaly, extra-axial collection. Cervicomedullary junction and pituitary are within normal limits. Signal in the deep gray nuclei, brainstem and cerebellum remains normal. Vascular: Major intracranial vascular flow voids appear stable since March. Skull and upper cervical spine: Negative visible cervical spine. Visualized bone marrow signal is within normal limits. Sinuses/Orbits: Leftward gaze deviation, otherwise negative orbits. Paranasal sinuses are clear. Other: Bilateral mastoid effusions have substantially regressed but not completely resolved. Negative visible nasopharynx; tiny retention cyst near midline on the left. MRA HEAD FINDINGS Mildly degraded by motion artifact today. Antegrade flow signal in the posterior circulation appears stable since March, with a persistent left side trigeminal artery substantially supplying the basilar artery distal to the patent AICA origins. Dominant distal left vertebral artery. The intervening basilar segment between the a ICAs and the persistent trigeminal has not definitely changed. SCA and PCA origins are patent and appear stable. There is a right side posterior communicating artery redemonstrated. Left PCA branches are stable and within normal limits. Right PCA  branches are also largely stable and within normal limits; questionable absence of a very distal branch today when compared to 11/24/2019 MIP images, on series 306, image 17. Although that branch might still be present on the source images. Stable antegrade flow signal in the anterior circulation. Stable left side persistent trigeminal artery origin. Mild anterior genu siphon irregularity bilaterally with associated mild stenosis. Patent carotid termini. Normal right posterior communicating artery origin. MCA and ACA origins remain patent. Diminutive or absent anterior communicating artery. Visible ACA branches are stable and within normal limits. Both MCA bifurcations remain patent without stenosis. And visible bilateral MCA branches appear stable since March, within normal limits. IMPRESSION: 1. Acute to subacute infarct involving some of the Right PCA and the Right MCA/PCA watershed area as seen by CT yesterday. Trace petechial  blood but no malignant hemorrhagic transformation or mass effect. 2. Expected evolution of superimposed confluent bilateral posterior MCA territory infarcts since March. 3. Stable intracranial MRA since March, with no large vessel occlusion. Mild ICA siphon atherosclerosis stenosis and Persistent Left side Trigeminal Artery . Electronically Signed   By: Genevie Ann M.D.   On: 03/09/2020 13:30   US RENAL  Result Date: 03/08/2020 CLINICAL DATA:  Acute kidney injury EXAM: RENAL / URINARY TRACT ULTRASOUND COMPLETE COMPARISON:  CT 12/03/2019 FINDINGS: Right Kidney: Renal measurements: 10.2 x 4.8 x 4.9 cm = volume: 125 mL. Mildly increased renal cortical echogenicity. No mass, shadowing stone, or hydronephrosis visualized. Left Kidney: Renal measurements: 10.9 x 5.9 x 6.2 cm = volume: 209 mL. Mildly increased renal cortical echogenicity. No mass, shadowing stone, or hydronephrosis visualized. Bladder: Appears normal for degree of bladder distention. Other: None. IMPRESSION: 1. Negative for  obstructive uropathy. 2. Mildly increased renal cortical echogenicity bilaterally suggesting sequela of medical renal disease. Electronically Signed   By: Davina Poke D.O.   On: 03/08/2020 15:57   DG Chest Portable 1 View  Result Date: 03/08/2020 CLINICAL DATA:  Altered mental status EXAM: PORTABLE CHEST 1 VIEW COMPARISON:  12/24/2019 FINDINGS: Low volume chest. Cardiomegaly and vascular pedicle prominence accentuated by low volumes. Diffuse interstitial and airspace opacity. No visible effusion or pneumothorax. IMPRESSION: Low volume chest with indistinct opacity that could be scarring or active infection when compared with recent priors. Electronically Signed   By: Monte Fantasia M.D.   On: 03/08/2020 11:16   EEG adult  Result Date: 03/09/2020 Alexis Goodell, MD     03/09/2020  3:49 PM ELECTROENCEPHALOGRAM REPORT Patient: Susan [name]       Room #: [facility] EEG No. ID: [id number] Age: 59 y.o.        Sex: female Requesting Physician: Carson Myrtle Report Date:  03/09/2020       Interpreting Physician: Alexis Goodell History: AIRA SALLADE is an 59 y.o. female with altered mental status Medications: Unasyn, ASA, Biktarvy, Neurontin, Sinequan, Seroquel, Crestor Conditions of Recording:  This is a 21 channel routine scalp EEG performed with bipolar and monopolar montages arranged in accordance to the international 10/20 system of electrode placement. One channel was dedicated to EKG recording. The patient is in the awake, drowsy and asleep states. Description:  Artifact is prominent during the recording often obscuring the background rhythm particularly during wakefulness therefore no waking background activity could be evaluated.  The patient does appear to drowse with a slow background noted consisting of irregular, low voltage delta and theta activity. The patient goes in to a light sleep with symmetrical sleep spindles, vertex central sharp transients and irregular slow activity.  No epileptiform activity is  noted.  Hyperventilation was not performed and intermittent photic stimulation were not performed. IMPRESSION: This is a technically difficult electroencephalogram secondary to the predominance of artifact.  Wakefulness could not be evaluated.  Normal drowse and sleep appeared to be present.  No epileptiform activity is noted.  Alexis Goodell, MD Neurology [phone] 03/09/2020, 3:43 PM   CT HEAD CODE STROKE WO CONTRAST`  Result Date: 03/08/2020 CLINICAL DATA:  Code stroke. EXAM: CT HEAD WITHOUT CONTRAST TECHNIQUE: Contiguous axial images were obtained from the base of the skull through the vertex without intravenous contrast. COMPARISON:  11/24/2019 FINDINGS: Brain: There is no acute intracranial hemorrhage or mass effect. Now chronic bilateral parietal and left temporal infarcts are identified. There is new hypoattenuation with loss of gray-white differentiation in the parasagittal right  occipital lobe likely extending into the parietal lobe. Ventricles are stable in size without evidence of hydrocephalus. Vascular: No hyperdense vessel. There is intracranial atherosclerotic calcification at the skull base. Skull: Unremarkable. Sinuses/Orbits: Aerated.  Orbits are unremarkable. Other: Mastoid air cells are clear. IMPRESSION: No acute intracranial hemorrhage or significant mass effect. Age-indeterminate right parasagittal occipital infarct with parietal extension. Chronic bilateral MCA territory infarcts. These results were communicated to Dr. Leonel Ramsay at 11:30 amon 7/6/2021by text page via the [facility] messaging system. Electronically Signed   By: Macy Mis M.D.   On: 03/08/2020 11:35     LOS: 2 days   Oren Binet, MD  [facility]    To contact the attending provider between 7A-7P or the covering provider during after hours 7P-7A, please log into the web site [email or web address] and access using universal [place] password for that web site. If you do not have the password, please call  the hospital operator.  03/10/2020, 9:45 AM

## 2020-03-10 NOTE — Progress Notes (Signed)
  Speech Language Pathology Treatment: Dysphagia  Patient Details Name: Susan [name] MRN: [id number] DOB: 08-16-1961 Today's Date: 03/10/2020 Time: 6286-3817 SLP Time Calculation (min) (ACUTE ONLY): 12 min  Assessment / Plan / Recommendation Clinical Impression  Susan [name] remains edentulous with no dentures present, but reports she will not eat the pureed food (breakfast tray present and she appeared to have eaten most of it). She was seen with regular solids, which she consumed quickly and had noted oral residue/pocketing. Pt requested liquid wash to clear this. She had no s/s aspiration with liquids or solids, but oral phase deficits put her at risk for choking d/t the oral holding/pocketing. Given ground solids, pt had improved oral clearance. At this time, recommend dysphagia 2 solids and thin liquids, alternating solids/liquids, check for oral clearance at end of meal, meds whole in puree. Ground solids are consistent with her baseline diet, therefore, no further ST indicated at this time. Should difficulty arise, please re-consult ST service.    HPI HPI: 59 year old female pt with prior history of tobacco abuse, HTN, HIV, DM, diabetic neuropathy, seizure disorder, bipolar disease, schizophrenia, IVDA and recent hospitalization from March to Jan 09 2020 with encephalopathy thought secondary to overdose complicated by MRSA/ aspiration pneumonia, pneumococcal bacteremia, septic shock, prolonged respiratory failure requiring tracheostomy and acute vs subacute bilateral MCA infarcts with dysphagia s/p gtube placement.  Tracheostomy was decannulated 4/28.  Pts last MBS on 5/4 recommended thin liquids via cup or straw with no aspiration, but mild residue and UES backflow requiring following solids with liquids and an occasional second swallow. Per patient's daughter, patient currently lives with her.  She is unclear why her feeding tube was removed 4 -5 weeks ago at the SNF but reports her diet is limited  due to having no teeth/ dentures.  She tells me her mother has been doing well until 2 days ago when she ran out of 2 of her psych meds (recalls one being Risperdal which is not on her med list) and since stopped eating and drinking.  She has also been taking nucynta for back pain.  She was found on the floor this morning after they heard her fall out of bed. Her pupils were noted to be pinpoint, therefore given narcan with improvement in mental status. CT head shows No acute intracranial hemorrhage or significant mass effect. Age-indeterminate right parasagittal occipital infarct with parietal extension. CT cervical spine shows Right upper lobe airspace opacity is noted concerning for possible pneumonia.      SLP Plan  Discharge SLP treatment due to (comment);All goals met       Recommendations  Diet recommendations: Dysphagia 2 (fine chop) Liquids provided via: Straw;Cup Medication Administration: Whole meds with puree Supervision: Patient able to self feed Compensations: Slow rate;Small sips/bites Postural Changes and/or Swallow Maneuvers: Seated upright 90 degrees;Upright 30-60 min after meal                Oral Care Recommendations: Oral care BID Follow up Recommendations: 24 hour supervision/assistance SLP Visit Diagnosis: Dysphagia, oropharyngeal phase (R13.12) Plan: Discharge SLP treatment due to (comment);All goals met                      Susan [name], M.S., CCC-SLP Speech-Language Pathologist Acute Rehabilitation Services Pager: [place] 03/10/2020, 11:46 AM

## 2020-03-10 NOTE — TOC Initial Note (Signed)
Transition of Care [facility]) - Initial/Assessment Note    Patient Details  Name: Susan [name] MRN: [id number] Date of Birth: 1961/07/13  Transition of Care [facility]) CM/SW Contact:    Verdell Carmine, RN Phone Number: 03/10/2020, 10:50 AM  Clinical Narrative:                 Patient arrived to ICU from home with new CVA and on narcan drip. Drug screen negative m but responded to narcan, suspected fentanyl overdose. PT recommendation of SNF. Plan for SNF  Upon discharge. Complicated discharge due to social issues.  Patient was receiving care at home from adult children post SNF. Was in the hospital 2 months prior to that.  Had G tube, but it was taken out at some point. SLP evaluated patient, no dentures, on puree diet and as long as eating should be getting adeq nutrition.   Expected Discharge Plan: Skilled Nursing Facility Barriers to Discharge: Continued Medical Work up   Patient Goals and CMS Choice        Expected Discharge Plan and Services Expected Discharge Plan: Skilled Nursing Facility In-house Referral: Clinical Social Work Discharge Planning Services: CM Consult                                          Prior Living Arrangements/Services   Lives with:: Adult Children Patient language and need for interpreter reviewed:: Yes        Need for Family Participation in Patient Care: Yes (Comment) Care giver support system in place?: Yes (comment) Current home services: DME Criminal Activity/Legal Involvement Pertinent to Current Situation/Hospitalization: Yes - Comment as needed  Activities of Daily Living      Permission Sought/Granted                  Emotional Assessment Appearance:: Appears older than stated age         Psych Involvement: No (comment)  Admission diagnosis:  Hypoglycemia [E16.2] Acute encephalopathy [G93.40] AKI (acute kidney injury) ([place]) [N17.9] Hypotension, unspecified hypotension type [I95.9] Patient Active Problem List    Diagnosis Date Noted  . Acute encephalopathy 03/08/2020  . Palliative care by specialist   . Adult failure to thrive   . Status post tracheostomy ([place])   . Cerebral embolism with cerebral infarction 11/24/2019  . Acute kidney injury (AKI) with acute tubular necrosis (ATN) (HCC)   . Aortic valve endocarditis 11/14/2019  . Recurrent falls 10/15/2019  . Constipation 02/26/2019  . Schizophrenia ([place]) 02/18/2019  . AKI (acute kidney injury) ([place]) 02/17/2019  . Acute respiratory failure with hypoxia ([place]) 02/16/2019  . Right hip pain 02/21/2018  . DNR (do not resuscitate) discussion 08/23/2017  . Weakness generalized 06/24/2017  . Osteoarthritis 07/14/2016  . Abnormal hemoglobin ([place]) 04/18/2016  . Hot flashes 10/16/2015  . PTSD (post-traumatic stress disorder) 05/12/2015  . MDD (major depressive disorder), recurrent episode, moderate ([place]) 05/11/2015  . Hypotension 05/11/2015  . Cannabis use disorder, severe, dependence ([place]) 05/11/2015  . Tobacco use disorder 05/11/2015  . Diabetes mellitus type 2 without retinopathy ([place]) 01/28/2015  . Hypoglycemia 01/28/2015  . Hereditary and idiopathic peripheral neuropathy 06/03/2013  . Dysfunctional uterine bleeding 06/04/2012  . Dyslipidemia   . GERD (gastroesophageal reflux disease)   . Schizoaffective disorder, bipolar type ([place]) 04/26/2011  . Morbid obesity ([place]) 04/26/2011  . HIV disease ([place]) 03/28/2011  . Hypertension 03/28/2011   PCP:  Hoyt Koch, MD Pharmacy:   [facility], [place] [address] Phone: [phone] Fax: [phone]  [facility] STORE [place], [place] [address] Phone: [phone] Fax: [phone]     Social Determinants of Health (SDOH) Interventions    Readmission Risk Interventions Readmission Risk Prevention  Plan 02/19/2019  Transportation Screening Complete  Medication Review ([place]) Complete  PCP or Specialist appointment within 3-5 days of discharge Complete  HRI or [place] Complete  SW Recovery Care/Counseling Consult Complete  [place] Not Applicable  Some recent data might be hidden

## 2020-03-10 NOTE — Procedures (Signed)
Interventional Radiology Procedure:   Indications: Poor venous access  Procedure: Central line placement  Findings: Right jugular central line, tip at SVC/RA junction  Complications: None     EBL: less than 10 ml  Plan: Central line is ready to use.     Susan Frei R. Anselm Pancoast, MD  Pager: [phone]

## 2020-03-10 NOTE — Progress Notes (Signed)
Pt pulled out her ij. Paged provider Ghimire to inform him who advised the nurse to call Iv team. Paged IR provider [facility] who stated to removed sutures from neck, save ij in bag, and that a new ij could be placed tomorrow. Will to continue to monitor pt.

## 2020-03-11 LAB — GLUCOSE, CAPILLARY
Glucose-Capillary: 198 mg/dL — ABNORMAL HIGH (ref 70–99)
Glucose-Capillary: 216 mg/dL — ABNORMAL HIGH (ref 70–99)
Glucose-Capillary: 132 mg/dL — ABNORMAL HIGH (ref 70–99)
Glucose-Capillary: 305 mg/dL — ABNORMAL HIGH (ref 70–99)

## 2020-03-11 LAB — PROCALCITONIN: Procalcitonin: 0.23 ng/mL

## 2020-03-11 MED ORDER — INSULIN GLARGINE 100 UNIT/ML ~~LOC~~ SOLN
10.0000 [IU] | Freq: Every day | SUBCUTANEOUS | Status: DC
  Administered 2020-03-11 – 2020-03-15 (×5): 10 [IU] via SUBCUTANEOUS
  Filled 2020-03-11 (×5): qty 0.1

## 2020-03-11 NOTE — NC FL2 (Signed)
[place] LEVEL OF CARE SCREENING TOOL     IDENTIFICATION  Patient Name: Susan [name] Birthdate: July 07, 1961 Sex: female Admission Date (Current Location): 03/08/2020  [facility] and [place] Number:  Herbalist and Address:  [facility] [place], [place] [address], [place], [place] [address]      Provider Number: [id number]  Attending Physician Name and Address:  Jonetta Osgood, MD  Relative Name and Phone Number:  Thayer Headings, daughter, [phone]    Current Level of Care: Hospital Recommended Level of Care: [place] Prior Approval Number:    Date Approved/Denied:   PASRR Number: [id number] F End 04/26/20  Discharge Plan: SNF    Current Diagnoses: Patient Active Problem List   Diagnosis Date Noted  . Acute encephalopathy 03/08/2020  . Palliative care by specialist   . Adult failure to thrive   . Status post tracheostomy ([place])   . Cerebral embolism with cerebral infarction 11/24/2019  . Acute kidney injury (AKI) with acute tubular necrosis (ATN) (HCC)   . Aortic valve endocarditis 11/14/2019  . Recurrent falls 10/15/2019  . Constipation 02/26/2019  . Schizophrenia ([place]) 02/18/2019  . AKI (acute kidney injury) ([place]) 02/17/2019  . Acute respiratory failure with hypoxia ([place]) 02/16/2019  . Right hip pain 02/21/2018  . DNR (do not resuscitate) discussion 08/23/2017  . Weakness generalized 06/24/2017  . Osteoarthritis 07/14/2016  . Abnormal hemoglobin ([place]) 04/18/2016  . Hot flashes 10/16/2015  . PTSD (post-traumatic stress disorder) 05/12/2015  . MDD (major depressive disorder), recurrent episode, moderate ([place]) 05/11/2015  . Hypotension 05/11/2015  . Cannabis use disorder, severe, dependence ([place]) 05/11/2015  . Tobacco use disorder 05/11/2015  . Diabetes mellitus type 2 without retinopathy ([place]) 01/28/2015  . Hypoglycemia 01/28/2015  . Hereditary and idiopathic peripheral neuropathy 06/03/2013  . Dysfunctional  uterine bleeding 06/04/2012  . Dyslipidemia   . GERD (gastroesophageal reflux disease)   . Schizoaffective disorder, bipolar type ([place]) 04/26/2011  . Morbid obesity ([place]) 04/26/2011  . HIV disease ([place]) 03/28/2011  . Hypertension 03/28/2011    Orientation RESPIRATION BLADDER Height & Weight     Self  Normal Continent, External catheter Weight: 207 lb 0.2 oz (93.9 kg) Height:  5\' 3"  (160 cm)  BEHAVIORAL SYMPTOMS/MOOD NEUROLOGICAL BOWEL NUTRITION STATUS      Continent Diet (Please see DC Summary)  AMBULATORY STATUS COMMUNICATION OF NEEDS Skin   Limited Assist Verbally Normal                       Personal Care Assistance Level of Assistance  Bathing, Feeding, Dressing Bathing Assistance: Limited assistance Feeding assistance: Independent Dressing Assistance: Maximum assistance     Functional Limitations Info  Sight, Hearing, Speech Sight Info: Adequate Hearing Info: Adequate Speech Info: Adequate    SPECIAL CARE FACTORS FREQUENCY  PT (By licensed PT), OT (By licensed OT)     PT Frequency: 5x/week OT Frequency: 5x/week            Contractures Contractures Info: Not present    Additional Factors Info  Code Status, Allergies, Psychotropic, Insulin Sliding Scale, Isolation Precautions Code Status Info: Full Allergies Info: Lyrica Psychotropic Info: Seroquel Insulin Sliding Scale Info: See dc summary for dose Isolation Precautions Info: MRSA     Current Medications (03/11/2020):  This is the current hospital active medication list Current Facility-Administered Medications  Medication Dose Route Frequency Provider Last Rate Last Admin  . 0.9 %  sodium chloride infusion  250 mL Intravenous Continuous  Jennelle Human B, NP   Stopped at 03/09/20 1213  . acetaminophen (TYLENOL) tablet 1,000 mg  1,000 mg Oral Q8H PRN Wynetta Fines T, MD      . albuterol (PROVENTIL) (2.5 MG/3ML) 0.083% nebulizer solution 3 mL  3 mL Inhalation Q6H PRN Wynetta Fines T, MD      .  Ampicillin-Sulbactam (UNASYN) 3 g in sodium chloride 0.9 % 100 mL IVPB  3 g Intravenous Q8H Skeet Simmer, [facility]   Held at 03/11/20 6389  . aspirin chewable tablet 81 mg  81 mg Oral Daily Wynetta Fines T, MD   81 mg at 03/11/20 0957  . bictegravir-emtricitabine-tenofovir AF (BIKTARVY) 50-200-25 MG per tablet 1 tablet  1 tablet Oral Daily Lequita Halt, MD   1 tablet at 03/11/20 1003  . chlorhexidine (PERIDEX) 0.12 % solution 15 mL  15 mL Mouth Rinse BID Cristal Generous, NP   15 mL at 03/11/20 0958  . Chlorhexidine Gluconate Cloth 2 % PADS 6 each  6 each Topical Daily Renee Pain, MD   6 each at 03/11/20 1006  . docusate sodium (COLACE) capsule 100 mg  100 mg Oral BID PRN Jennelle Human B, NP      . doxepin (SINEQUAN) capsule 50 mg  50 mg Oral QHS Wynetta Fines T, MD   50 mg at 03/10/20 2206  . gabapentin (NEURONTIN) capsule 100 mg  100 mg Oral BID Wynetta Fines T, MD   100 mg at 03/11/20 0957  . glucagon (human recombinant) (GLUCAGEN) injection 1 mg  1 mg Intravenous Once PRN Wynetta Fines T, MD      . haloperidol lactate (HALDOL) injection 2 mg  2 mg Intramuscular Q6H PRN Wynetta Fines T, MD   2 mg at 03/09/20 0128  . heparin injection 5,000 Units  5,000 Units Subcutaneous Q8H Lequita Halt, MD   5,000 Units at 03/11/20 [phone]  . hydrALAZINE (APRESOLINE) tablet 25 mg  25 mg Oral Q6H PRN Wynetta Fines T, MD      . insulin aspart (novoLOG) injection 0-15 Units  0-15 Units Subcutaneous TID AC & HS Shalhoub, Sherryll Burger, MD   3 Units at 03/11/20 0815  . lidocaine (XYLOCAINE) 1 % (with pres) injection   Infiltration PRN Markus Daft, MD   10 mL at 03/10/20 1551  . MEDLINE mouth rinse  15 mL Mouth Rinse q12n4p Bowser, Grace E, NP   15 mL at 03/10/20 1736  . mupirocin ointment (BACTROBAN) 2 % 1 application  1 application Nasal BID Cristal Generous, NP   1 application at 28/76/81 0955  . pantoprazole (PROTONIX) EC tablet 40 mg  40 mg Oral Daily Wynetta Fines T, MD   40 mg at 03/11/20 0959  . polyethylene glycol (MIRALAX /  GLYCOLAX) packet 17 g  17 g Oral Daily PRN Jennelle Human B, NP      . QUEtiapine (SEROQUEL) tablet 200 mg  200 mg Oral BID Wynetta Fines T, MD   200 mg at 03/11/20 1003  . rosuvastatin (CRESTOR) tablet 20 mg  20 mg Oral Daily Wynetta Fines T, MD   20 mg at 03/11/20 1001  . umeclidinium-vilanterol (ANORO ELLIPTA) 62.5-25 MCG/INH 1 puff  1 puff Inhalation Daily Wynetta Fines T, MD   1 puff at 03/11/20 1000     Discharge Medications: Please see discharge summary for a list of discharge medications.  Relevant Imaging Results:  Relevant Lab Results:   Additional Information SSN [id number]  Benard Halsted, LCSW

## 2020-03-11 NOTE — TOC Initial Note (Addendum)
Transition of Care [facility]) - Initial/Assessment Note    Patient Details  Name: Susan [name] MRN: [id number] Date of Birth: [birth year]  Transition of Care [facility]) CM/SW Contact:    Benard Halsted, LCSW Phone Number: 03/11/2020, 2:19 PM  Clinical Narrative:                 CSW received consult for possible SNF placement at time of discharge. CSW spoke with patient's daughter regarding PT recommendation of SNF placement at time of discharge. Patient's daughter reported that she is currently unable to care for patient at their home given patient's current physical needs and fall risk. Patient's daughter expressed understanding of PT recommendation and is agreeable to SNF placement at time of discharge. She reports preference for [facility] since she has done rehab there before. CSW discussed insurance authorization process and provided Medicare SNF ratings list. Patient's daughter expressed being hopeful for rehab and to feel better soon. Her stated goal is to keep the patient out of an ALF. She reports understanding that patient has not received her COVID vaccines and thus would be in 14-day quarantine at [facility]. No further questions reported at this time. CSW to continue to follow and assist with discharge planning needs.   Expected Discharge Plan: Skilled Nursing Facility Barriers to Discharge: Continued Medical Work up   Patient Goals and CMS Choice Patient states their goals for this hospitalization and ongoing recovery are:: Rehab CMS Medicare.gov Compare Post Acute Care list provided to:: Patient Represenative (must comment) (Daughter) Choice offered to / list presented to : Adult Children  Expected Discharge Plan and Services Expected Discharge Plan: [place] In-house Referral: Clinical Social Work Discharge Planning Services: CM Consult Post Acute Care Choice: [place] Living arrangements for the past 2 months: [place]                                       Prior Living Arrangements/Services Living arrangements for the past 2 months: Single Family Home Lives with:: Adult Children Patient language and need for interpreter reviewed:: Yes Do you feel safe going back to the place where you live?: Yes      Need for Family Participation in Patient Care: Yes (Comment) Care giver support system in place?: Yes (comment) Current home services: DME Criminal Activity/Legal Involvement Pertinent to Current Situation/Hospitalization: Yes - Comment as needed  Activities of Daily Living      Permission Sought/Granted Permission sought to share information with : Facility Sport and exercise psychologist, Family Supports Permission granted to share information with : No  Share Information with NAME: Susan [name]  Permission granted to share info w AGENCY: SNFs  Permission granted to share info w Relationship: Daughter  Permission granted to share info w Contact Information: [phone]  Emotional Assessment Appearance:: Appears older than stated age Attitude/Demeanor/Rapport: Unable to Assess Affect (typically observed): Unable to Assess Orientation: : Oriented to Self Alcohol / Substance Use: Not Applicable Psych Involvement: No (comment)  Admission diagnosis:  Hypoglycemia [E16.2] Acute encephalopathy [G93.40] AKI (acute kidney injury) ([place]) [N17.9] Hypotension, unspecified hypotension type [I95.9] Patient Active Problem List   Diagnosis Date Noted  . Acute encephalopathy 03/08/2020  . Palliative care by specialist   . Adult failure to thrive   . Status post tracheostomy ([place])   . Cerebral embolism with cerebral infarction 11/24/2019  . Acute kidney injury (AKI) with acute tubular necrosis (ATN) (HCC)   .  Aortic valve endocarditis 11/14/2019  . Recurrent falls 10/15/2019  . Constipation 02/26/2019  . Schizophrenia ([place]) 02/18/2019  . AKI (acute kidney injury) ([place]) 02/17/2019  . Acute respiratory failure with hypoxia ([place])  02/16/2019  . Right hip pain 02/21/2018  . DNR (do not resuscitate) discussion 08/23/2017  . Weakness generalized 06/24/2017  . Osteoarthritis 07/14/2016  . Abnormal hemoglobin ([place]) 04/18/2016  . Hot flashes 10/16/2015  . PTSD (post-traumatic stress disorder) 05/12/2015  . MDD (major depressive disorder), recurrent episode, moderate ([place]) 05/11/2015  . Hypotension 05/11/2015  . Cannabis use disorder, severe, dependence ([place]) 05/11/2015  . Tobacco use disorder 05/11/2015  . Diabetes mellitus type 2 without retinopathy ([place]) 01/28/2015  . Hypoglycemia 01/28/2015  . Hereditary and idiopathic peripheral neuropathy 06/03/2013  . Dysfunctional uterine bleeding 06/04/2012  . Dyslipidemia   . GERD (gastroesophageal reflux disease)   . Schizoaffective disorder, bipolar type ([place]) 04/26/2011  . Morbid obesity ([place]) 04/26/2011  . HIV disease ([place]) 03/28/2011  . Hypertension 03/28/2011   PCP:  Hoyt Koch, MD Pharmacy:   [place], [place] [address] Phone: [phone] Fax: [phone]  [facility] DR[facility] STORE [place], [place] [address] Phone: [phone] Fax: [phone]     Social Determinants of Health (SDOH) Interventions    Readmission Risk Interventions Readmission Risk Prevention Plan 03/11/2020 02/19/2019  Transportation Screening Complete Complete  Medication Review (RN Care Manager) Referral to Pharmacy Complete  PCP or Specialist appointment within 3-5 days of discharge Complete Complete  HRI or Home Care Consult Complete Complete  SW Recovery Care/Counseling Consult Complete Complete  Palliative Care Screening Not Applicable Not [place] Complete Not Applicable  Some recent data might be hidden

## 2020-03-11 NOTE — Plan of Care (Signed)
  Problem: Education: Goal: Ability to describe self-care measures that may prevent or decrease complications (Diabetes Survival Skills Education) will improve Outcome: Progressing Goal: Individualized Educational Video(s) Outcome: Progressing   Problem: Coping: Goal: Ability to adjust to condition or change in health will improve Outcome: Progressing   Problem: Fluid Volume: Goal: Ability to maintain a balanced intake and output will improve Outcome: Progressing   Problem: Health Behavior/Discharge Planning: Goal: Ability to identify and utilize available resources and services will improve Outcome: Progressing Goal: Ability to manage health-related needs will improve Outcome: Progressing   Problem: Metabolic: Goal: Ability to maintain appropriate glucose levels will improve Outcome: Progressing   Problem: Nutritional: Goal: Maintenance of adequate nutrition will improve Outcome: Progressing Goal: Progress toward achieving an optimal weight will improve Outcome: Progressing   Problem: Skin Integrity: Goal: Risk for impaired skin integrity will decrease Outcome: Progressing   

## 2020-03-11 NOTE — TOC Progression Note (Signed)
Transition of Care [facility]) - Progression Note    Patient Details  Name: Susan [name] MRN: [id number] Date of Birth: 13-Apr-1961  Transition of Care [facility]) CM/SW [place], RN Phone Number: 03/11/2020, 10:07 AM  Clinical Narrative:    Dr Sloan Leiter spoke with daughter. She understands and agrees that SNF placement is advantageous for her mother at this time.  Will start process.    Expected Discharge Plan: Skilled Nursing Facility Barriers to Discharge: Continued Medical Work up  Expected Discharge Plan and Services Expected Discharge Plan: [place] In-house Referral: Clinical Social Work Discharge Planning Services: CM Consult                                           Social Determinants of Health (SDOH) Interventions    Readmission Risk Interventions Readmission Risk Prevention Plan 02/19/2019  Transportation Screening Complete  Medication Review Press photographer) Complete  PCP or Specialist appointment within 3-5 days of discharge Complete  HRI or [place] Complete  SW Recovery Care/Counseling Consult Complete  [place] Not Applicable  Some recent data might be hidden

## 2020-03-11 NOTE — Progress Notes (Signed)
Physical Therapy Treatment Patient Details Name: Susan [name] MRN: [id number] DOB: [birth year] Today's Date: 03/11/2020    History of Present Illness Susan [name] is a 59 year old female with a PMH significant for tobacco abuse, HTN, HIV, DM, diabetic neuropathy, seizure disorder, bipolar disease, schizophrenia, and IVDA. Notably, she was recently hospitalized from March to May 8th, 2021 with encephalopathy secondary to overdose which was complicated by MRSA pneumonia, pneumococcal bacteremia, septic shock, and respiratory failure requiring tracheostomy and G-tube placement. She was decannulated on April 28th, 2021. During her prior hospitalization she was noted to have acute vs subacute bilateral MCA infarcts for which her daugter noticed gradual improvement.  She was found by EMS to be lethargic, confused, hypotensive, and hypoglycemic with pinpoint pupils. She was given several doses of Narcan with improvement in mental status and is currently on a Narcan drip. Her hypoglycemia was treated with oral glucagon and D50. She was noted to have left-sided neglect and left hemiparesis. CT Head showed interval right PCA stroke, potentially acute on chronic changes.    PT Comments    Pt in bed upon arrival of PT, eager to participate in session due to desire to get OOB and cleaned up. The pt was able to demo marked improvements in capability for transfers and short ambulation in room with minA and HHA to complete safely, but continues to present with limitations in balance, endurance, and safety awareness which all make her a significant fall risk. The pt will continue to benefit from skilled PT acutely and following d/c to improve functional strength, stability, and facilitate return to safety and independence with mobility.    Follow Up Recommendations  SNF;Supervision/Assistance - 24 hour     Equipment Recommendations  None recommended by PT    Recommendations for Other Services        Precautions / Restrictions Precautions Precautions: Fall Precaution Comments: decreased cog limits safety awareness Restrictions Weight Bearing Restrictions: No    Mobility  Bed Mobility Overal bed mobility: Needs Assistance Bed Mobility: Supine to Sit;Sit to Supine     Supine to sit: Min guard     General bed mobility comments: minG for safety, pt eager to get out of bed. VC for sequencing and positioning at EOB  Transfers Overall transfer level: Needs assistance Equipment used: None;1 person hand held assist Transfers: Sit to/from Stand Sit to Stand: Min assist         General transfer comment: minA to rise from EOB through HHA of 1, pt was able to complete x3 through session. Pt then able to stand from [facility] and recliner with VC for hand placement but no HHA. minA to steady once in standing  Ambulation/Gait Ambulation/Gait assistance: Min assist Gait Distance (Feet): 5 Feet (+ 8 ft) Assistive device: 1 person hand held assist Gait Pattern/deviations: Step-to pattern;Decreased stride length;Shuffle;Wide base of support Gait velocity: decrease Gait velocity interpretation: <1.31 ft/sec, indicative of household ambulator General Gait Details: Pt takes short, shuffling steps with minimal clearance, wide BOS with increased lateral movement.      Modified Rankin (Stroke Patients Only) Modified Rankin (Stroke Patients Only) Pre-Morbid Rankin Score: Moderate disability Modified Rankin: Moderately severe disability     Balance Overall balance assessment: Needs assistance Sitting-balance support: No upper extremity supported;Feet supported Sitting balance-Leahy Scale: Good     Standing balance support: Single extremity supported;During functional activity Standing balance-Leahy Scale: Fair  Cognition Arousal/Alertness: Awake/alert Behavior During Therapy: Restless Overall Cognitive Status: History of cognitive impairments -  at baseline Area of Impairment: Orientation;Memory;Following commands;Safety/judgement;Problem solving                 Orientation Level: Disoriented to;Place;Time;Situation   Memory: Decreased recall of precautions;Decreased short-term memory Following Commands: Follows one step commands inconsistently Safety/Judgement: Decreased awareness of safety;Decreased awareness of deficits   Problem Solving: Slow processing;Requires verbal cues;Requires tactile cues;Difficulty sequencing General Comments: Pt restless in bed and eager to move, upon initiation of movement, pt found to be soiled which likely explained restlessness. pt also frustrated about not receiving meals, but upon speaking to NT the pt had been brought food throughout the day but did not want it due to her current diet.      Exercises      General Comments General comments (skin integrity, edema, etc.): VSS      Pertinent Vitals/Pain Pain Assessment: No/denies pain Faces Pain Scale: No hurt Pain Intervention(s): Monitored during session           PT Goals (current goals can now be found in the care plan section) Acute Rehab PT Goals Patient Stated Goal: dtr wishes pt to attain safe level to go home. PT Goal Formulation: With patient/family Time For Goal Achievement: 03/23/20 Potential to Achieve Goals: Fair Progress towards PT goals: Progressing toward goals    Frequency    Min 3X/week      PT Plan Current plan remains appropriate       AM-PAC PT "6 Clicks" Mobility   Outcome Measure  Help needed turning from your back to your side while in a flat bed without using bedrails?: None Help needed moving from lying on your back to sitting on the side of a flat bed without using bedrails?: A Little Help needed moving to and from a bed to a chair (including a wheelchair)?: A Little Help needed standing up from a chair using your arms (e.g., wheelchair or bedside chair)?: A Little Help needed to walk  in hospital room?: A Little Help needed climbing 3-5 steps with a railing? : A Lot 6 Click Score: 18    End of Session Equipment Utilized During Treatment: Gait belt Activity Tolerance: Patient tolerated treatment well Patient left: with call bell/phone within reach;in chair;with chair alarm set Nurse Communication: Mobility status (pt desire for food) PT Visit Diagnosis: Other abnormalities of gait and mobility (R26.89);Difficulty in walking, not elsewhere classified (R26.2);Other symptoms and signs involving the nervous system (R29.898)     Time: 4239-5320 PT Time Calculation (min) (ACUTE ONLY): 35 min  Charges:  $Gait Training: 8-22 mins $Therapeutic Activity: 8-22 mins                     Karma Ganja, PT, DPT   Acute Rehabilitation Department Pager #: [phone]   Otho Bellows 03/11/2020, 3:01 PM

## 2020-03-11 NOTE — Progress Notes (Signed)
PROGRESS NOTE        PATIENT DETAILS Name: Susan [name] Age: 59 y.o. Sex: female Date of Birth: 1960-10-05 Admit Date: 03/08/2020 Admitting Physician Renee Pain, MD QHU:[id number], Real Cons, MD  Brief Narrative: Patient is a 59 y.o. female history of HTN, HIV, DM, seizure disorder, bipolar disorder, prolonged hospitalization from 3/10-5/8 for respiratory failure/encephalopathy/septic shock/CVA-required intubation-difficult to wean off ventilator-tracheostomy/PEG tube placement-she was eventually decannulated on 4/28-discharged to SNF-presented to the hospital on 7/6 for altered mental status, hypoglycemia/hypotension-thought to be multifactorial-from unintentional narcotic overdose-she was transferred to the ICU-required Narcan infusion-upon stability-she was transferred to [facility] service.  MRI of the brain done this admission was positive for acute/subacute infarct involving the right PCA/right MCA watershed area.  Significant events: 7/6>> presented to [facility] with confusion/hypoglycemia/hypotension found to have AKI-requiring several doses of Narcan-eventually Narcan drip for presumed unintentional narcotic overdose. 7/8>> transfer to [facility] from ICU.  Significant studies: 3/16>> TEE: EF 55-60% 3/23>> daughter Doppler: No significant stenosis. 3/23>> lower extremity Doppler: No DVT. 7/6>> chest x-ray: Low volume chest with indistinct opacity-that could be scarring/active infection 7/6>> CT head: Age-indeterminate right parasagittal occipital infarct with bilateral extension. 7/6>> CT C-spine: No acute abnormality noted in the C-spine 7/6>> renal ultrasound: Negative for obstructive uropathy 7/7>> MRI brain: Acute to subacute infarct involving the right PCA and right MCA/PCA watershed area 7/7>> MRI brain: Stable intracranial MRA since March-with no large vessel occlusion. 7/7>> EEG: No seizures.  Antimicrobial therapy: Unasyn: 7/6>>  Microbiology data: 7/6>>  urine culture:<10,000 colonies/mL insignificant growth 7/6>> blood culture: No growth  Procedures : 7/8>> right jugular central line by IR (pulled out by patient later on 7/8 evening)  Consults: PCCM Neurology  DVT Prophylaxis : SCDs Start: 03/08/20 2005 heparin injection 5,000 Units Start: 03/08/20 1500   Subjective: Lying comfortably in bed-wants to go to SNF-mental status slowly improving-she seems to be answering a lot more questions appropriately today.  Assessment/Plan: Acute toxic metabolic encephalopathy: Multifactorial-likely narcotic overdose, hypoglycemia, AKI and sepsis physiology all contributing.  Mental status improving after treatment of underlying conditions.  Suspected narcotic overdose: Although urine drug screen negative-she responded well Per PCCM note with Narcan infusion.  Per H&P (subsequently confirmed by daughter on 7/9)-patient apparently was started on Nucynta 1 day prior to this hospital stay-(not listed on home med list)-urine drug screen negative as well.  Avoid narcotics as much as possible.  Hypoglycemia: Suspect likely secondary to poor oral intake in the setting of confusion-resolved-CBGs stable with SSI.  Sepsis acute hypoxic respiratory failure likely secondary to aspiration pneumonia: Sepsis physiology has resolved-blood cultures negative-continue Unasyn.  Attempt to titrate off oxygen.  AKI: Likely hemodynamically mediated-creatinine has significantly improved with supportive care.  Renal ultrasound was negative for hydronephrosis.  Follow renal function periodically.  Acute CVA: Apparently had transient left-sided weakness that has resolved-see imaging studies as noted above-LDL 52, A1c 11.7-had stroke work-up during her most recent admission-neurology recommending aspirin.  HIV: Patient CD4 count on 6/2 882-continue antiretrovirals.  HTN: Blood pressure controlled-not on any antihypertensives.  DM-2 (A1c 11.7 on 7/6): Had hypoglycemia on  admission-diet has now somewhat stabilized-we will go and start Lantus 10 units daily-continue SSI and follow.  Given her numerous medical comorbidities-may be best to allow some amount of permissive hyperglycemia.    Recent Labs    03/10/20 2105 03/11/20 0743 03/11/20 1141  GLUCAP 124* 198* 216*  Bipolar disorder/schizophrenia: Remains stable-continue Seroquel, doxepin  Dysphagia: Recently had PEG tube removed-seen by SLP-has been upgraded to a dysphagia 2 diet.  History of substance use: UDS positive for cannabinoids-suspected to have unintentional narcotic overdose on presentation.    Other issues: Hard stick for lab draws-I have placed central venous access in right IJ-unfortunately patient pulled it out on 7/8.  Will continue to follow-if frequent lab work/IV line needed-suspect now can place PICC line.  Nutrition Problem: Nutrition Problem: Increased nutrient needs Etiology: acute illness Signs/Symptoms: estimated needs Interventions: Magic cup  Obesity: Estimated body mass index is 36.67 kg/m as calculated from the following:   Height as of this encounter: 5\' 3"  (1.6 m).   Weight as of this encounter: 93.9 kg.    Diet: Diet Order            DIET DYS 2 Room service appropriate? Yes with Assist; Fluid consistency: Thin  Diet effective now                  Code Status: Full code   Family Communication: Daughter over the phone on 7/9  Disposition Plan: Status is: Inpatient  Remains inpatient appropriate because:Inpatient level of care appropriate due to severity of illness   Dispo: The patient is from: Home              Anticipated d/c is to: TBD              Anticipated d/c date is: 2 days              Patient currently is not medically stable to d/c.   Barriers to Discharge: Resolving encephalopathy-on IV Unasyn for aspiration pneumonia-AKI-not yet back to baseline.  Antimicrobial agents: Anti-infectives (From admission, onward)   Start      Dose/Rate Route Frequency Ordered Stop   03/10/20 1400  Ampicillin-Sulbactam (UNASYN) 3 g in sodium chloride 0.9 % 100 mL IVPB     Discontinue     3 g 200 mL/hr over 30 Minutes Intravenous Every 8 hours 03/10/20 1324     03/09/20 0600  ampicillin-sulbactam (UNASYN) 1.5 g in sodium chloride 0.9 % 100 mL IVPB  Status:  Discontinued        1.5 g 200 mL/hr over 30 Minutes Intravenous Every 12 hours 03/08/20 1716 03/10/20 1324   03/08/20 1930  bictegravir-emtricitabine-tenofovir AF (BIKTARVY) 50-200-25 MG per tablet 1 tablet     Discontinue     1 tablet Oral Daily 03/08/20 1453     03/08/20 1515  Ampicillin-Sulbactam (UNASYN) 3 g in sodium chloride 0.9 % 100 mL IVPB        3 g 200 mL/hr over 30 Minutes Intravenous  Once 03/08/20 1500 03/08/20 2126       Time spent: 25- minutes-Greater than 50% of this time was spent in counseling, explanation of diagnosis, planning of further management, and coordination of care.  MEDICATIONS: Scheduled Meds: . aspirin  81 mg Oral Daily  . bictegravir-emtricitabine-tenofovir AF  1 tablet Oral Daily  . chlorhexidine  15 mL Mouth Rinse BID  . Chlorhexidine Gluconate Cloth  6 each Topical Daily  . doxepin  50 mg Oral QHS  . gabapentin  100 mg Oral BID  . heparin  5,000 Units Subcutaneous Q8H  . insulin aspart  0-15 Units Subcutaneous TID AC & HS  . mouth rinse  15 mL Mouth Rinse q12n4p  . mupirocin ointment  1 application Nasal BID  . pantoprazole  40 mg Oral Daily  .  QUEtiapine  200 mg Oral BID  . rosuvastatin  20 mg Oral Daily  . umeclidinium-vilanterol  1 puff Inhalation Daily   Continuous Infusions: . sodium chloride Stopped (03/09/20 1213)  . ampicillin-sulbactam (UNASYN) IV Stopped (03/11/20 0649)   PRN Meds:.acetaminophen, albuterol, docusate sodium, glucagon (human recombinant), haloperidol lactate, hydrALAZINE, lidocaine, polyethylene glycol   PHYSICAL EXAM: Vital signs: Vitals:   03/11/20 0000 03/11/20 0400 03/11/20 0600 03/11/20 0740    BP: 123/74 130/69  113/64  Pulse: 91 95  90  Resp: 15 18  10   Temp: 98.7 F (37.1 C) 98 F (36.7 C)  98.7 F (37.1 C)  TempSrc: Axillary Axillary  Oral  SpO2: 100% 97%  94%  Weight:   93.9 kg   Height:       Filed Weights   03/09/20 0424 03/10/20 0400 03/11/20 0600  Weight: 96.1 kg 92.9 kg 93.9 kg   Body mass index is 36.67 kg/m.   Gen Exam:Alert awake-not in any distress HEENT:atraumatic, normocephalic Chest: B/L clear to auscultation anteriorly CVS:S1S2 regular Abdomen:soft non tender, non distended Extremities:no edema Neurology: Non focal Skin: no rash  I have personally reviewed following labs and imaging studies  LABORATORY DATA: CBC: Recent Labs  Lab 03/08/20 1043 03/08/20 1051 03/10/20 1100  WBC 13.1*  --  7.1  NEUTROABS 10.0*  --   --   HGB 14.2 15.0 12.7  HCT 44.7 44.0 39.7  MCV 87.6  --  87.8  PLT PLATELET CLUMPS NOTED ON SMEAR, COUNT APPEARS ADEQUATE  --  264    Basic Metabolic Panel: Recent Labs  Lab 03/08/20 1043 03/08/20 1051 03/10/20 1100  NA 130* 128* 140  K 4.0 3.8 4.3  CL 88* 87* 105  CO2 27  --  27  GLUCOSE 178* 184* 246*  BUN 34* 43* 14  CREATININE 3.40* 3.50* 1.22*  CALCIUM 9.1  --  8.6*    GFR: Estimated Creatinine Clearance: 55.4 mL/min (A) (by C-G formula based on SCr of 1.22 mg/dL (H)).  Liver Function Tests: Recent Labs  Lab 03/08/20 1043  AST 24  ALT 16  ALKPHOS 99  BILITOT 0.5  PROT 6.9  ALBUMIN 3.2*   No results for input(s): LIPASE, AMYLASE in the last 168 hours. No results for input(s): AMMONIA in the last 168 hours.  Coagulation Profile: No results for input(s): INR, PROTIME in the last 168 hours.  Cardiac Enzymes: No results for input(s): CKTOTAL, CKMB, CKMBINDEX, TROPONINI in the last 168 hours.  BNP (last 3 results) No results for input(s): PROBNP in the last 8760 hours.  Lipid Profile: No results for input(s): CHOL, HDL, LDLCALC, TRIG, CHOLHDL, LDLDIRECT in the last 72 hours.  Thyroid  Function Tests: No results for input(s): TSH, T4TOTAL, FREET4, T3FREE, THYROIDAB in the last 72 hours.  Anemia Panel: No results for input(s): VITAMINB12, FOLATE, FERRITIN, TIBC, IRON, RETICCTPCT in the last 72 hours.  Urine analysis:    Component Value Date/Time   COLORURINE STRAW (A) 03/08/2020 1306   APPEARANCEUR CLEAR 03/08/2020 1306   LABSPEC 1.022 03/08/2020 1306   PHURINE 5.0 03/08/2020 1306   GLUCOSEU >=500 (A) 03/08/2020 1306   HGBUR NEGATIVE 03/08/2020 1306   BILIRUBINUR NEGATIVE 03/08/2020 1306   KETONESUR NEGATIVE 03/08/2020 1306   PROTEINUR NEGATIVE 03/08/2020 1306   UROBILINOGEN 1.0 06/01/2015 0104   NITRITE NEGATIVE 03/08/2020 1306   LEUKOCYTESUR NEGATIVE 03/08/2020 1306    Sepsis Labs: Lactic Acid, Venous    Component Value Date/Time   LATICACIDVEN 0.9 03/08/2020 1919  MICROBIOLOGY: Recent Results (from the past 240 hour(s))  SARS Coronavirus 2 by RT PCR (hospital order, performed in [facility] hospital lab) Nasopharyngeal Nasopharyngeal Swab     Status: None   Collection Time: 03/08/20 11:26 AM   Specimen: Nasopharyngeal Swab  Result Value Ref Range Status   SARS Coronavirus 2 NEGATIVE NEGATIVE Final    Comment: (NOTE) SARS-CoV-2 target nucleic acids are NOT DETECTED.  The SARS-CoV-2 RNA is generally detectable in upper and lower respiratory specimens during the acute phase of infection. The lowest concentration of SARS-CoV-2 viral copies this assay can detect is 250 copies / mL. A negative result does not preclude SARS-CoV-2 infection and should not be used as the sole basis for treatment or other patient management decisions.  A negative result may occur with improper specimen collection / handling, submission of specimen other than nasopharyngeal swab, presence of viral mutation(s) within the areas targeted by this assay, and inadequate number of viral copies (<250 copies / mL). A negative result must be combined with clinical observations,  patient history, and epidemiological information.  Fact Sheet for Patients:   StrictlyIdeas.no  Fact Sheet for Healthcare Providers: BankingDealers.co.za  This test is not yet approved or  cleared by the [place] FDA and has been authorized for detection and/or diagnosis of SARS-CoV-2 by FDA under an Emergency Use Authorization (EUA).  This EUA will remain in effect (meaning this test can be used) for the duration of the COVID-19 declaration under Section 564(b)(1) of the Act, 21 U.S.C. section 360bbb-3(b)(1), unless the authorization is terminated or revoked sooner.  Performed at [facility], [place] [address], [place], [place] [address]   Urine culture     Status: Abnormal   Collection Time: 03/08/20  1:16 PM   Specimen: Urine, Random  Result Value Ref Range Status   Specimen Description URINE, RANDOM  Final   Special Requests NONE  Final   Culture (A)  Final    <10,000 COLONIES/mL INSIGNIFICANT GROWTH Performed at [facility], [place] [address] [place], [place] [address]    Report Status 03/09/2020 FINAL  Final  Culture, blood (routine x 2)     Status: None (Preliminary result)   Collection Time: 03/08/20  1:36 PM   Specimen: BLOOD  Result Value Ref Range Status   Specimen Description BLOOD BLOOD LEFT HAND  Final   Special Requests   Final    BOTTLES DRAWN AEROBIC ONLY Blood Culture results may not be optimal due to an inadequate volume of blood received in culture bottles   Culture   Final    NO GROWTH 2 DAYS Performed at [facility], [place] [address], [place], [place] [address]    Report Status PENDING  Incomplete  Culture, blood (routine x 2)     Status: None (Preliminary result)   Collection Time: 03/08/20  7:19 PM   Specimen: BLOOD LEFT FOREARM  Result Value Ref Range Status   Specimen Description BLOOD LEFT FOREARM  Final   Special Requests AEROBIC BOTTLE ONLY Blood Culture adequate volume  Final     Culture   Final    NO GROWTH 2 DAYS Performed at [facility], [place] [address] [place], [place] [address]    Report Status PENDING  Incomplete  MRSA PCR Screening     Status: Abnormal   Collection Time: 03/09/20  9:41 AM   Specimen: Nasal Mucosa; Nasopharyngeal  Result Value Ref Range Status   MRSA by PCR POSITIVE (A)  NEGATIVE Final    Comment:        The GeneXpert MRSA Assay (FDA approved for NASAL specimens only), is one component of a comprehensive MRSA colonization surveillance program. It is not intended to diagnose MRSA infection nor to guide or monitor treatment for MRSA infections. CRITICAL RESULT CALLED TO, READ BACK BY AND VERIFIED WITH: RN Maggie Font AT 1121 03/09/20 BY MM Performed at [facility], [place] [address] [place], [place] [address]     RADIOLOGY STUDIES/RESULTS: IR Fluoro Guide CV Line Right  Result Date: 03/10/2020 INDICATION: 59 year old with multiple medical problems and poor venous access. Request for central line placement. EXAM: PLACEMENT OF CENTRAL LINE WITH ULTRASOUND AND FLUOROSCOPIC GUIDANCE COMPARISON:  None. MEDICATIONS: None ANESTHESIA/SEDATION: None FLUOROSCOPY TIME:  18 seconds, 1 mGy COMPLICATIONS: None immediate. PROCEDURE: Informed consent was obtained for central line placement. Ultrasound confirmed a patent right internal jugular vein. Ultrasound image was saved for documentation. Right side of the neck was prepped and draped in sterile fashion. Maximal barrier sterile technique was utilized including caps, mask, sterile gowns, sterile gloves, sterile drape, hand hygiene and skin antiseptic. Skin was anesthetized with 1% lidocaine. Small incision was made. Using ultrasound guidance, 21 gauge needle was directed into the right internal jugular vein and wire was advanced centrally. Peel-away sheath was placed. A dual lumen power PICC line was cut to 15 cm. Catheter was placed through the peel-away sheath. Catheter tip was placed at  the SVC and right atrium junction. Both lumens aspirated and flushed well. Both lumens were flushed with saline. Catheter was sutured to skin and a bandage was placed. Fluoroscopic and ultrasound images were taken and saved for documentation. FINDINGS: Catheter tip at the SVC and right atrium junction. IMPRESSION: Placement of right jugular central line with ultrasound and fluoroscopic guidance. Electronically Signed   By: Markus Daft M.D.   On: 03/10/2020 17:16   IR US Guide Vasc Access Right  Result Date: 03/10/2020 INDICATION: 59 year old with multiple medical problems and poor venous access. Request for central line placement. EXAM: PLACEMENT OF CENTRAL LINE WITH ULTRASOUND AND FLUOROSCOPIC GUIDANCE COMPARISON:  None. MEDICATIONS: None ANESTHESIA/SEDATION: None FLUOROSCOPY TIME:  18 seconds, 1 mGy COMPLICATIONS: None immediate. PROCEDURE: Informed consent was obtained for central line placement. Ultrasound confirmed a patent right internal jugular vein. Ultrasound image was saved for documentation. Right side of the neck was prepped and draped in sterile fashion. Maximal barrier sterile technique was utilized including caps, mask, sterile gowns, sterile gloves, sterile drape, hand hygiene and skin antiseptic. Skin was anesthetized with 1% lidocaine. Small incision was made. Using ultrasound guidance, 21 gauge needle was directed into the right internal jugular vein and wire was advanced centrally. Peel-away sheath was placed. A dual lumen power PICC line was cut to 15 cm. Catheter was placed through the peel-away sheath. Catheter tip was placed at the SVC and right atrium junction. Both lumens aspirated and flushed well. Both lumens were flushed with saline. Catheter was sutured to skin and a bandage was placed. Fluoroscopic and ultrasound images were taken and saved for documentation. FINDINGS: Catheter tip at the SVC and right atrium junction. IMPRESSION: Placement of right jugular central line with  ultrasound and fluoroscopic guidance. Electronically Signed   By: Markus Daft M.D.   On: 03/10/2020 17:16   EEG adult  Result Date: 03/09/2020 Alexis Goodell, MD     03/09/2020  3:49 PM ELECTROENCEPHALOGRAM REPORT Patient: ANDREKA STUCKI       Room #: [facility] EEG  No. ID: [id number] Age: 59 y.o.        Sex: female Requesting Physician: Carson Myrtle Report Date:  03/09/2020       Interpreting Physician: Alexis Goodell History: ISEBELLA UPSHUR is an 59 y.o. female with altered mental status Medications: Unasyn, ASA, Biktarvy, Neurontin, Sinequan, Seroquel, Crestor Conditions of Recording:  This is a 21 channel routine scalp EEG performed with bipolar and monopolar montages arranged in accordance to the international 10/20 system of electrode placement. One channel was dedicated to EKG recording. The patient is in the awake, drowsy and asleep states. Description:  Artifact is prominent during the recording often obscuring the background rhythm particularly during wakefulness therefore no waking background activity could be evaluated.  The patient does appear to drowse with a slow background noted consisting of irregular, low voltage delta and theta activity. The patient goes in to a light sleep with symmetrical sleep spindles, vertex central sharp transients and irregular slow activity.  No epileptiform activity is noted.  Hyperventilation was not performed and intermittent photic stimulation were not performed. IMPRESSION: This is a technically difficult electroencephalogram secondary to the predominance of artifact.  Wakefulness could not be evaluated.  Normal drowse and sleep appeared to be present.  No epileptiform activity is noted.  Alexis Goodell, MD Neurology [phone] 03/09/2020, 3:43 PM     LOS: 3 days   Oren Binet, MD  [facility]    To contact the attending provider between 7A-7P or the covering provider during after hours 7P-7A, please log into the web site [email or web address] and access using  universal [place] password for that web site. If you do not have the password, please call the hospital operator.  03/11/2020, 1:54 PM

## 2020-03-12 LAB — BASIC METABOLIC PANEL
Anion gap: 10 (ref 5–15)
BUN: 9 mg/dL (ref 6–20)
CO2: 23 mmol/L (ref 22–32)
Calcium: 8.7 mg/dL — ABNORMAL LOW (ref 8.9–10.3)
Chloride: 106 mmol/L (ref 98–111)
Creatinine, Ser: 1.01 mg/dL — ABNORMAL HIGH (ref 0.44–1.00)
GFR calc Af Amer: >60 mL/min (ref >60)
GFR calc non Af Amer: >60 mL/min (ref >60)
Glucose, Bld: 184 mg/dL — ABNORMAL HIGH (ref 70–99)
Potassium: 3.8 mmol/L (ref 3.5–5.1)
Sodium: 139 mmol/L (ref 135–145)

## 2020-03-12 LAB — GLUCOSE, CAPILLARY
Glucose-Capillary: 149 mg/dL — ABNORMAL HIGH (ref 70–99)
Glucose-Capillary: 168 mg/dL — ABNORMAL HIGH (ref 70–99)
Glucose-Capillary: 205 mg/dL — ABNORMAL HIGH (ref 70–99)
Glucose-Capillary: 219 mg/dL — ABNORMAL HIGH (ref 70–99)

## 2020-03-12 MED ORDER — CARVEDILOL 3.125 MG PO TABS
3.1250 mg | ORAL_TABLET | Freq: Two times a day (BID) | ORAL | Status: DC
  Administered 2020-03-12 – 2020-03-21 (×19): 3.125 mg via ORAL
  Filled 2020-03-12 (×19): qty 1

## 2020-03-12 MED ORDER — LIDOCAINE 5 % EX PTCH
1.0000 | MEDICATED_PATCH | CUTANEOUS | Status: DC
  Administered 2020-03-12 – 2020-03-21 (×10): 1 via TRANSDERMAL
  Filled 2020-03-12 (×10): qty 1

## 2020-03-12 MED ORDER — AMLODIPINE BESYLATE 5 MG PO TABS
5.0000 mg | ORAL_TABLET | Freq: Every day | ORAL | Status: DC
  Administered 2020-03-12 – 2020-03-21 (×10): 5 mg via ORAL
  Filled 2020-03-12 (×10): qty 1

## 2020-03-12 NOTE — Progress Notes (Signed)
PROGRESS NOTE        PATIENT DETAILS Name: Susan [name] Age: 59 y.o. Sex: female Date of Birth: 02-Jul-1961 Admit Date: 03/08/2020 Admitting Physician Renee Pain, MD [id number], Real Cons, MD  Brief Narrative: Patient is a 59 y.o. female history of HTN, HIV, DM, seizure disorder, bipolar disorder, prolonged hospitalization from 3/10-5/8 for respiratory failure/encephalopathy/septic shock/CVA-required intubation-difficult to wean off ventilator-tracheostomy/PEG tube placement-she was eventually decannulated on 4/28-discharged to SNF-presented to the hospital on 7/6 for altered mental status, hypoglycemia/hypotension-thought to be multifactorial-from unintentional narcotic overdose-she was transferred to the ICU-required Narcan infusion-upon stability-she was transferred to [facility] service.  MRI of the brain done this admission was positive for acute/subacute infarct involving the right PCA/right MCA watershed area.  Significant events: 7/6>> presented to [facility] with confusion/hypoglycemia/hypotension found to have AKI-requiring several doses of Narcan-eventually Narcan drip for presumed unintentional narcotic overdose. 7/8>> transfer to [facility] from ICU.  Significant studies: 3/16>> TEE: EF 55-60% 3/23>> daughter Doppler: No significant stenosis. 3/23>> lower extremity Doppler: No DVT. 7/6>> chest x-ray: Low volume chest with indistinct opacity-that could be scarring/active infection 7/6>> CT head: Age-indeterminate right parasagittal occipital infarct with bilateral extension. 7/6>> CT C-spine: No acute abnormality noted in the C-spine 7/6>> renal ultrasound: Negative for obstructive uropathy 7/7>> MRI brain: Acute to subacute infarct involving the right PCA and right MCA/PCA watershed area 7/7>> MRI brain: Stable intracranial MRA since March-with no large vessel occlusion. 7/7>> EEG: No seizures.  Antimicrobial therapy: Unasyn: 7/6>>  Microbiology data: 7/6>>  urine culture:<10,000 colonies/mL insignificant growth 7/6>> blood culture: No growth  Procedures : 7/8>> right jugular central line by IR (pulled out by patient later on 7/8 evening)  Consults: PCCM Neurology  DVT Prophylaxis : SCDs Start: 03/08/20 2005 heparin injection 5,000 Units Start: 03/08/20 1500   Subjective: Lying comfortably in bed-reemphasize to me that she wants to go to SNF.  Denies any chest pain or shortness of breath.  Claims back pain is tolerable.  Assessment/Plan: Acute toxic metabolic encephalopathy: Multifactorial-likely narcotic overdose, hypoglycemia, AKI and sepsis physiology all contributing.  Mental status improving after treatment of underlying conditions.  Suspected narcotic overdose: Although urine drug screen negative-she responded well Per PCCM note to Narcan infusion.  Per H&P (subsequently confirmed by daughter on 7/9 by this MD)-patient apparently was started on Nucynta 1 day prior to this hospital stay-(not listed on home med list)-urine drug screen negative as well.  Avoid narcotics as much as possible.  Hypoglycemia: Suspect likely secondary to poor oral intake in the setting of confusion-resolved-CBGs stable with SSI.  Sepsis acute hypoxic respiratory failure likely secondary to aspiration pneumonia: Sepsis physiology has resolved-blood cultures negative-continue Unasyn.  Has been titrated off oxygen and stable on room air.  AKI: Likely hemodynamically mediated-creatinine has significantly improved with supportive care.  Renal ultrasound was negative for hydronephrosis.  Follow renal function periodically.  Acute CVA: Apparently had transient left-sided weakness that has resolved-see imaging studies as noted above-LDL 52, A1c 11.7-had stroke work-up during her most recent admission-neurology recommending aspirin.  HIV: Patient CD4 count on 6/2 882-continue antiretrovirals.  HTN: Blood pressure creeping up-restart amlodipine at 5 mg-restart  Coreg-follow and adjust.  DM-2 (A1c 11.7 on 7/6): Had hypoglycemia on admission-diet has now stabilized-restarted on Lantus 10 units-SSI-CBGs relatively stable.  Follow closely.  Given her numerous medical comorbidities-may be best to allow some amount of permissive hyperglycemia.  Recent Labs    03/11/20 2238 03/12/20 0753 03/12/20 1140  GLUCAP 305* 149* 168*   Bipolar disorder/schizophrenia: Remains stable-continue Seroquel, doxepin  Dysphagia: Recently had PEG tube removed-seen by SLP-has been upgraded to a dysphagia 2 diet.  History of substance use: UDS positive for cannabinoids-suspected to have unintentional narcotic overdose on presentation.    Chronic back pain: Avoid narcotics as much as possible-continue Neurontin, Tylenol as needed and start Lidoderm patch.  Nutrition Problem: Nutrition Problem: Increased nutrient needs Etiology: acute illness Signs/Symptoms: estimated needs Interventions: Magic cup  Obesity: Estimated body mass index is 35.89 kg/m as calculated from the following:   Height as of this encounter: 5\' 3"  (1.6 m).   Weight as of this encounter: 91.9 kg.    Diet: Diet Order            DIET DYS 2 Room service appropriate? Yes with Assist; Fluid consistency: Thin  Diet effective now                  Code Status: Full code   Family Communication: Daughter over the phone on 7/9-we will update over the next few days  Disposition Plan: Status is: Inpatient  Remains inpatient appropriate because:Inpatient level of care appropriate due to severity of illness   Dispo: The patient is from: Home              Anticipated d/c is to: TBD              Anticipated d/c date is: 2 days              Patient currently is not medically stable to d/c.   Barriers to Discharge: Resolving encephalopathy-on IV Unasyn for aspiration pneumonia-AKI-not yet back to baseline.  Antimicrobial agents: Anti-infectives (From admission, onward)   Start      Dose/Rate Route Frequency Ordered Stop   03/10/20 1400  Ampicillin-Sulbactam (UNASYN) 3 g in sodium chloride 0.9 % 100 mL IVPB     Discontinue     3 g 200 mL/hr over 30 Minutes Intravenous Every 8 hours 03/10/20 1324     03/09/20 0600  ampicillin-sulbactam (UNASYN) 1.5 g in sodium chloride 0.9 % 100 mL IVPB  Status:  Discontinued        1.5 g 200 mL/hr over 30 Minutes Intravenous Every 12 hours 03/08/20 1716 03/10/20 1324   03/08/20 1930  bictegravir-emtricitabine-tenofovir AF (BIKTARVY) 50-200-25 MG per tablet 1 tablet     Discontinue     1 tablet Oral Daily 03/08/20 1453     03/08/20 1515  Ampicillin-Sulbactam (UNASYN) 3 g in sodium chloride 0.9 % 100 mL IVPB        3 g 200 mL/hr over 30 Minutes Intravenous  Once 03/08/20 1500 03/08/20 2126       Time spent: 25- minutes-Greater than 50% of this time was spent in counseling, explanation of diagnosis, planning of further management, and coordination of care.  MEDICATIONS: Scheduled Meds: . aspirin  81 mg Oral Daily  . bictegravir-emtricitabine-tenofovir AF  1 tablet Oral Daily  . chlorhexidine  15 mL Mouth Rinse BID  . Chlorhexidine Gluconate Cloth  6 each Topical Daily  . doxepin  50 mg Oral QHS  . gabapentin  100 mg Oral BID  . heparin  5,000 Units Subcutaneous Q8H  . insulin aspart  0-15 Units Subcutaneous TID AC & HS  . insulin glargine  10 Units Subcutaneous Daily  . mouth rinse  15 mL Mouth Rinse q12n4p  . mupirocin  ointment  1 application Nasal BID  . pantoprazole  40 mg Oral Daily  . QUEtiapine  200 mg Oral BID  . rosuvastatin  20 mg Oral Daily  . umeclidinium-vilanterol  1 puff Inhalation Daily   Continuous Infusions: . sodium chloride Stopped (03/09/20 1213)  . ampicillin-sulbactam (UNASYN) IV 3 g (03/12/20 0618)   PRN Meds:.acetaminophen, albuterol, docusate sodium, glucagon (human recombinant), haloperidol lactate, hydrALAZINE, lidocaine, polyethylene glycol   PHYSICAL EXAM: Vital signs: Vitals:   03/12/20  0407 03/12/20 0756 03/12/20 0902 03/12/20 1142  BP: 121/62 (!) 160/80  (!) 175/96  Pulse: 84 77  66  Resp: 16 14  16   Temp: 98 F (36.7 C) 98 F (36.7 C)  98.2 F (36.8 C)  TempSrc: Oral Oral  Oral  SpO2: 94% 93% 95% 96%  Weight: 91.9 kg     Height:       Filed Weights   03/10/20 0400 03/11/20 0600 03/12/20 0407  Weight: 92.9 kg 93.9 kg 91.9 kg   Body mass index is 35.89 kg/m.   Gen Exam:Alert awake-not in any distress HEENT:atraumatic, normocephalic Chest: B/L clear to auscultation anteriorly CVS:S1S2 regular Abdomen:soft non tender, non distended Extremities:no edema Neurology: Non focal Skin: no rash  I have personally reviewed following labs and imaging studies  LABORATORY DATA: CBC: Recent Labs  Lab 03/08/20 1043 03/08/20 1051 03/10/20 1100  WBC 13.1*  --  7.1  NEUTROABS 10.0*  --   --   HGB 14.2 15.0 12.7  HCT 44.7 44.0 39.7  MCV 87.6  --  87.8  PLT PLATELET CLUMPS NOTED ON SMEAR, COUNT APPEARS ADEQUATE  --  242    Basic Metabolic Panel: Recent Labs  Lab 03/08/20 1043 03/08/20 1051 03/10/20 1100 03/12/20 0306  NA 130* 128* 140 139  K 4.0 3.8 4.3 3.8  CL 88* 87* 105 106  CO2 27  --  27 23  GLUCOSE 178* 184* 246* 184*  BUN 34* 43* 14 9  CREATININE 3.40* 3.50* 1.22* 1.01*  CALCIUM 9.1  --  8.6* 8.7*    GFR: Estimated Creatinine Clearance: 66.2 mL/min (A) (by C-G formula based on SCr of 1.01 mg/dL (H)).  Liver Function Tests: Recent Labs  Lab 03/08/20 1043  AST 24  ALT 16  ALKPHOS 99  BILITOT 0.5  PROT 6.9  ALBUMIN 3.2*   No results for input(s): LIPASE, AMYLASE in the last 168 hours. No results for input(s): AMMONIA in the last 168 hours.  Coagulation Profile: No results for input(s): INR, PROTIME in the last 168 hours.  Cardiac Enzymes: No results for input(s): CKTOTAL, CKMB, CKMBINDEX, TROPONINI in the last 168 hours.  BNP (last 3 results) No results for input(s): PROBNP in the last 8760 hours.  Lipid Profile: No results  for input(s): CHOL, HDL, LDLCALC, TRIG, CHOLHDL, LDLDIRECT in the last 72 hours.  Thyroid Function Tests: No results for input(s): TSH, T4TOTAL, FREET4, T3FREE, THYROIDAB in the last 72 hours.  Anemia Panel: No results for input(s): VITAMINB12, FOLATE, FERRITIN, TIBC, IRON, RETICCTPCT in the last 72 hours.  Urine analysis:    Component Value Date/Time   COLORURINE STRAW (A) 03/08/2020 1306   APPEARANCEUR CLEAR 03/08/2020 1306   LABSPEC 1.022 03/08/2020 1306   PHURINE 5.0 03/08/2020 1306   GLUCOSEU >=500 (A) 03/08/2020 1306   HGBUR NEGATIVE 03/08/2020 1306   BILIRUBINUR NEGATIVE 03/08/2020 1306   KETONESUR NEGATIVE 03/08/2020 1306   PROTEINUR NEGATIVE 03/08/2020 1306   UROBILINOGEN 1.0 06/01/2015 0104   NITRITE NEGATIVE 03/08/2020 1306  LEUKOCYTESUR NEGATIVE 03/08/2020 1306    Sepsis Labs: Lactic Acid, Venous    Component Value Date/Time   LATICACIDVEN 0.9 03/08/2020 1919    MICROBIOLOGY: Recent Results (from the past 240 hour(s))  SARS Coronavirus 2 by RT PCR (hospital order, performed in [facility] hospital lab) Nasopharyngeal Nasopharyngeal Swab     Status: None   Collection Time: 03/08/20 11:26 AM   Specimen: Nasopharyngeal Swab  Result Value Ref Range Status   SARS Coronavirus 2 NEGATIVE NEGATIVE Final    Comment: (NOTE) SARS-CoV-2 target nucleic acids are NOT DETECTED.  The SARS-CoV-2 RNA is generally detectable in upper and lower respiratory specimens during the acute phase of infection. The lowest concentration of SARS-CoV-2 viral copies this assay can detect is 250 copies / mL. A negative result does not preclude SARS-CoV-2 infection and should not be used as the sole basis for treatment or other patient management decisions.  A negative result may occur with improper specimen collection / handling, submission of specimen other than nasopharyngeal swab, presence of viral mutation(s) within the areas targeted by this assay, and inadequate number of viral  copies (<250 copies / mL). A negative result must be combined with clinical observations, patient history, and epidemiological information.  Fact Sheet for Patients:   StrictlyIdeas.no  Fact Sheet for Healthcare Providers: BankingDealers.co.za  This test is not yet approved or  cleared by the [place] FDA and has been authorized for detection and/or diagnosis of SARS-CoV-2 by FDA under an Emergency Use Authorization (EUA).  This EUA will remain in effect (meaning this test can be used) for the duration of the COVID-19 declaration under Section 564(b)(1) of the Act, 21 U.S.C. section 360bbb-3(b)(1), unless the authorization is terminated or revoked sooner.  Performed at [facility], [place] [address], [place], [place] [address]   Urine culture     Status: Abnormal   Collection Time: 03/08/20  1:16 PM   Specimen: Urine, Random  Result Value Ref Range Status   Specimen Description URINE, RANDOM  Final   Special Requests NONE  Final   Culture (A)  Final    <10,000 COLONIES/mL INSIGNIFICANT GROWTH Performed at [facility], [place] [address], [place], [place] [address]    Report Status 03/09/2020 FINAL  Final  Culture, blood (routine x 2)     Status: None (Preliminary result)   Collection Time: 03/08/20  1:36 PM   Specimen: BLOOD  Result Value Ref Range Status   Specimen Description BLOOD BLOOD LEFT HAND  Final   Special Requests   Final    BOTTLES DRAWN AEROBIC ONLY Blood Culture results may not be optimal due to an inadequate volume of blood received in culture bottles   Culture   Final    NO GROWTH 3 DAYS Performed at [facility], [place] [address], [place], [place] [address]    Report Status PENDING  Incomplete  Culture, blood (routine x 2)     Status: None (Preliminary result)   Collection Time: 03/08/20  7:19 PM   Specimen: BLOOD LEFT FOREARM  Result Value Ref Range Status   Specimen Description BLOOD LEFT  FOREARM  Final   Special Requests AEROBIC BOTTLE ONLY Blood Culture adequate volume  Final   Culture   Final    NO GROWTH 3 DAYS Performed at [facility], [place] [address], [place], [place] [address]    Report Status PENDING  Incomplete  MRSA PCR Screening     Status: Abnormal  Collection Time: 03/09/20  9:41 AM   Specimen: Nasal Mucosa; Nasopharyngeal  Result Value Ref Range Status   MRSA by PCR POSITIVE (A) NEGATIVE Final    Comment:        The GeneXpert MRSA Assay (FDA approved for NASAL specimens only), is one component of a comprehensive MRSA colonization surveillance program. It is not intended to diagnose MRSA infection nor to guide or monitor treatment for MRSA infections. CRITICAL RESULT CALLED TO, READ BACK BY AND VERIFIED WITH: RN Maggie Font AT 1121 03/09/20 BY MM Performed at [facility], [place] [address] [place], [place] [address]     RADIOLOGY STUDIES/RESULTS: IR Fluoro Guide CV Line Right  Result Date: 03/10/2020 INDICATION: 59 year old with multiple medical problems and poor venous access. Request for central line placement. EXAM: PLACEMENT OF CENTRAL LINE WITH ULTRASOUND AND FLUOROSCOPIC GUIDANCE COMPARISON:  None. MEDICATIONS: None ANESTHESIA/SEDATION: None FLUOROSCOPY TIME:  18 seconds, 1 mGy COMPLICATIONS: None immediate. PROCEDURE: Informed consent was obtained for central line placement. Ultrasound confirmed a patent right internal jugular vein. Ultrasound image was saved for documentation. Right side of the neck was prepped and draped in sterile fashion. Maximal barrier sterile technique was utilized including caps, mask, sterile gowns, sterile gloves, sterile drape, hand hygiene and skin antiseptic. Skin was anesthetized with 1% lidocaine. Small incision was made. Using ultrasound guidance, 21 gauge needle was directed into the right internal jugular vein and wire was advanced centrally. Peel-away sheath was placed. A dual lumen power PICC line was  cut to 15 cm. Catheter was placed through the peel-away sheath. Catheter tip was placed at the SVC and right atrium junction. Both lumens aspirated and flushed well. Both lumens were flushed with saline. Catheter was sutured to skin and a bandage was placed. Fluoroscopic and ultrasound images were taken and saved for documentation. FINDINGS: Catheter tip at the SVC and right atrium junction. IMPRESSION: Placement of right jugular central line with ultrasound and fluoroscopic guidance. Electronically Signed   By: Markus Daft M.D.   On: 03/10/2020 17:16   IR US Guide Vasc Access Right  Result Date: 03/10/2020 INDICATION: 59 year old with multiple medical problems and poor venous access. Request for central line placement. EXAM: PLACEMENT OF CENTRAL LINE WITH ULTRASOUND AND FLUOROSCOPIC GUIDANCE COMPARISON:  None. MEDICATIONS: None ANESTHESIA/SEDATION: None FLUOROSCOPY TIME:  18 seconds, 1 mGy COMPLICATIONS: None immediate. PROCEDURE: Informed consent was obtained for central line placement. Ultrasound confirmed a patent right internal jugular vein. Ultrasound image was saved for documentation. Right side of the neck was prepped and draped in sterile fashion. Maximal barrier sterile technique was utilized including caps, mask, sterile gowns, sterile gloves, sterile drape, hand hygiene and skin antiseptic. Skin was anesthetized with 1% lidocaine. Small incision was made. Using ultrasound guidance, 21 gauge needle was directed into the right internal jugular vein and wire was advanced centrally. Peel-away sheath was placed. A dual lumen power PICC line was cut to 15 cm. Catheter was placed through the peel-away sheath. Catheter tip was placed at the SVC and right atrium junction. Both lumens aspirated and flushed well. Both lumens were flushed with saline. Catheter was sutured to skin and a bandage was placed. Fluoroscopic and ultrasound images were taken and saved for documentation. FINDINGS: Catheter tip at the SVC  and right atrium junction. IMPRESSION: Placement of right jugular central line with ultrasound and fluoroscopic guidance. Electronically Signed   By: Markus Daft M.D.   On: 03/10/2020 17:16     LOS: 4 days   Aaleeyah Bias  Vanissa Strength, MD  [facility]    To contact the attending provider between 7A-7P or the covering provider during after hours 7P-7A, please log into the web site [email or web address] and access using universal [place] password for that web site. If you do not have the password, please call the hospital operator.  03/12/2020, 1:43 PM

## 2020-03-13 LAB — CULTURE, BLOOD (ROUTINE X 2)
Culture: NO GROWTH
Culture: NO GROWTH
Special Requests: ADEQUATE

## 2020-03-13 LAB — GLUCOSE, CAPILLARY
Glucose-Capillary: 163 mg/dL — ABNORMAL HIGH (ref 70–99)
Glucose-Capillary: 206 mg/dL — ABNORMAL HIGH (ref 70–99)
Glucose-Capillary: 205 mg/dL — ABNORMAL HIGH (ref 70–99)
Glucose-Capillary: 236 mg/dL — ABNORMAL HIGH (ref 70–99)

## 2020-03-13 NOTE — Progress Notes (Signed)
Pharmacy Antibiotic Note  Susan [name] is a 59 y.o. female admitted on 03/08/2020 with aspiration pneumonia.  Pharmacy has been consulted for Unasyn dosing. Renal function is slightly improved and the patient continues to be afebrile with a downtrending WBC.   Plan: Continue with Unasyn 3g IV every 8 hours.  Follow renal function, culture data, and clinical progress.   Height: 5\' 3"  (160 cm) Weight: 93.6 kg (206 lb 5.6 oz) IBW/kg (Calculated) : 52.4  Temp (24hrs), Avg:98.3 F (36.8 C), Min:98.1 F (36.7 C), Max:98.5 F (36.9 C)  Recent Labs  Lab 03/08/20 1043 03/08/20 1051 03/08/20 1919 03/10/20 1100 03/12/20 0306  WBC 13.1*  --   --  7.1  --   CREATININE 3.40* 3.50*  --  1.22* 1.01*  LATICACIDVEN 3.8*  --  0.9  --   --     Estimated Creatinine Clearance: 66.8 mL/min (A) (by C-G formula based on SCr of 1.01 mg/dL (H)).    Allergies  Allergen Reactions  . Lyrica [Pregabalin] Swelling    Has LE swelling    Antimicrobials this admission: Unasyn 7/6>>  Dose adjustments this admission: 7/8: Unasyn 1.6g IV q12 hours to 3g IV q8 hours for improved renal function  Microbiology results: 7/6 BCx: no growth x4 days 7/6 UCx: insignificant growth 7/6 MRSA PCR: positive (on CHG bath and mupirocin nasal x5 days)  Thank you for allowing pharmacy to be a part of this patient's care.  Wilson Singer, PharmD PGY1 Pharmacy Resident 03/13/2020 2:07 PM
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
## 2020-03-13 NOTE — Progress Notes (Addendum)
PROGRESS NOTE        PATIENT DETAILS Name: Susan [name] Age: 59 y.o. Sex: female Date of Birth: 1960-11-23 Admit Date: 03/08/2020 Admitting Physician Renee Pain, MD IRJ:JOACZYSA, Real Cons, MD  Brief Narrative: Patient is a 59 y.o. female history of HTN, HIV, DM, seizure disorder, bipolar disorder, prolonged hospitalization from 3/10-5/8 for respiratory failure/encephalopathy/septic shock/CVA-required intubation-difficult to wean off ventilator-tracheostomy/PEG tube placement-she was eventually decannulated on 4/28-discharged to SNF-presented to the hospital on 7/6 for altered mental status, hypoglycemia/hypotension-thought to be multifactorial-from unintentional narcotic overdose-she was transferred to the ICU-required Narcan infusion-upon stability-she was transferred to [facility] service.  MRI of the brain done this admission was positive for acute/subacute infarct involving the right PCA/right MCA watershed area.  Significant events: 7/6>> presented to [facility] with confusion/hypoglycemia/hypotension found to have AKI-requiring several doses of Narcan-eventually Narcan drip for presumed unintentional narcotic overdose. 7/8>> transfer to [facility] from ICU.  Significant studies: 3/16>> TEE: EF 55-60% 3/23>> daughter Doppler: No significant stenosis. 3/23>> lower extremity Doppler: No DVT. 7/6>> chest x-ray: Low volume chest with indistinct opacity-that could be scarring/active infection 7/6>> CT head: Age-indeterminate right parasagittal occipital infarct with bilateral extension. 7/6>> CT C-spine: No acute abnormality noted in the C-spine 7/6>> renal ultrasound: Negative for obstructive uropathy 7/7>> MRI brain: Acute to subacute infarct involving the right PCA and right MCA/PCA watershed area 7/7>> MRI brain: Stable intracranial MRA since March-with no large vessel occlusion. 7/7>> EEG: No seizures.  Antimicrobial therapy: Unasyn: 7/6>>7/11  Microbiology  data: 7/6>> urine culture:<10,000 colonies/mL insignificant growth 7/6>> blood culture: No growth  Procedures : 7/8>> right jugular central line by IR (pulled out by patient later on 7/8 evening)  Consults: PCCM Neurology  DVT Prophylaxis : SCDs Start: 03/08/20 2005 heparin injection 5,000 Units Start: 03/08/20 1500   Subjective: Sitting at bedside chair-once her diet upgraded from a dysphagia 2 diet.  No chest pain or shortness of breath.  Awaiting SNF bed.  Assessment/Plan: Acute toxic metabolic encephalopathy: Multifactorial-likely narcotic overdose, hypoglycemia, AKI and sepsis physiology all contributing.  Mental status improving after treatment of underlying conditions.  Suspected narcotic overdose: Although urine drug screen negative-she responded well (per PCCM note) to Narcan infusion.  Per H&P (subsequently confirmed by daughter on 7/9 by this MD)-patient apparently was started on Nucynta 1 day prior to this hospital stay-(not listed on home med list)-urine drug screen negative as well.  Avoid narcotics as much as possible.  Hypoglycemia: Suspect likely secondary to poor oral intake in the setting of confusion-resolved-CBGs stable with SSI.  Sepsis acute hypoxic respiratory failure likely secondary to aspiration pneumonia: Sepsis physiology has resolved-blood cultures negative-continue Unasyn-stop date of 7/11.  Has been titrated off oxygen and stable on room air.  AKI: Likely hemodynamically mediated-creatinine has significantly improved with supportive care.  Renal ultrasound was negative for hydronephrosis.  Follow renal function periodically.  Acute CVA: Apparently had transient left-sided weakness that has resolved-see imaging studies as noted above-LDL 52, A1c 11.7-had stroke work-up during her most recent admission-neurology recommending aspirin.  HIV: Patient CD4 count on 6/2 882-continue antiretrovirals.  HTN: Blood pressure creeping up-restart amlodipine at 5  mg-restart Coreg-follow and adjust.  DM-2 (A1c 11.7 on 7/6): Had hypoglycemia on admission-diet has now stabilized-restarted on Lantus 10 units-SSI-CBGs relatively stable.  Follow closely.  Given her numerous medical comorbidities-may be best to allow some amount of permissive hyperglycemia.  Recent Labs    03/12/20 2107 03/13/20 0726 03/13/20 1205  GLUCAP 219* 163* 206*   Bipolar disorder/schizophrenia: Remains stable-continue Seroquel, doxepin  Dysphagia: Recently had PEG tube removed-seen by SLP-has been upgraded to a regular diet by SLP on 7/11  History of substance use: UDS positive for cannabinoids-suspected to have unintentional narcotic overdose on presentation.    Chronic back pain: Avoid narcotics as much as possible-continue Neurontin, Tylenol as needed and Lidoderm patch.  Nutrition Problem: Nutrition Problem: Increased nutrient needs Etiology: acute illness Signs/Symptoms: estimated needs Interventions: Magic cup  Obesity: Estimated body mass index is 36.55 kg/m as calculated from the following:   Height as of this encounter: 5\' 3"  (1.6 m).   Weight as of this encounter: 93.6 kg.    Diet: Diet Order            Diet regular Room service appropriate? Yes with Assist; Fluid consistency: Thin  Diet effective now                  Code Status: Full code   Family Communication: Daughter over the phone on 7/9-we will update over the next few days  Disposition Plan: Status is: Inpatient  Remains inpatient appropriate because:Inpatient level of care appropriate due to severity of illness   Dispo: The patient is from: Home              Anticipated d/c is to: TBD              Anticipated d/c date is: 1 days              Patient currently is  medically stable to d/c.   Barriers to Discharge: Resolving encephalopathy-on IV Unasyn for aspiration pneumonia-AKI-not yet back to baseline.  Antimicrobial agents: Anti-infectives (From admission, onward)    Start     Dose/Rate Route Frequency Ordered Stop   03/10/20 1400  Ampicillin-Sulbactam (UNASYN) 3 g in sodium chloride 0.9 % 100 mL IVPB     Discontinue     3 g 200 mL/hr over 30 Minutes Intravenous Every 8 hours 03/10/20 1324 03/15/20 1359   03/09/20 0600  ampicillin-sulbactam (UNASYN) 1.5 g in sodium chloride 0.9 % 100 mL IVPB  Status:  Discontinued        1.5 g 200 mL/hr over 30 Minutes Intravenous Every 12 hours 03/08/20 1716 03/10/20 1324   03/08/20 1930  bictegravir-emtricitabine-tenofovir AF (BIKTARVY) 50-200-25 MG per tablet 1 tablet     Discontinue     1 tablet Oral Daily 03/08/20 1453     03/08/20 1515  Ampicillin-Sulbactam (UNASYN) 3 g in sodium chloride 0.9 % 100 mL IVPB        3 g 200 mL/hr over 30 Minutes Intravenous  Once 03/08/20 1500 03/08/20 2126       Time spent: 25- minutes-Greater than 50% of this time was spent in counseling, explanation of diagnosis, planning of further management, and coordination of care.  MEDICATIONS: Scheduled Meds:  amLODipine  5 mg Oral Daily   aspirin  81 mg Oral Daily   bictegravir-emtricitabine-tenofovir AF  1 tablet Oral Daily   carvedilol  3.125 mg Oral BID   chlorhexidine  15 mL Mouth Rinse BID   Chlorhexidine Gluconate Cloth  6 each Topical Daily   doxepin  50 mg Oral QHS   gabapentin  100 mg Oral BID   heparin  5,000 Units Subcutaneous Q8H   insulin aspart  0-15 Units Subcutaneous TID AC & HS   insulin glargine  10 Units Subcutaneous Daily   lidocaine  1 patch Transdermal Q24H   mouth rinse  15 mL Mouth Rinse q12n4p   mupirocin ointment  1 application Nasal BID   pantoprazole  40 mg Oral Daily   QUEtiapine  200 mg Oral BID   rosuvastatin  20 mg Oral Daily   umeclidinium-vilanterol  1 puff Inhalation Daily   Continuous Infusions:  sodium chloride Stopped (03/09/20 1213)   ampicillin-sulbactam (UNASYN) IV 3 g (03/13/20 0603)   PRN Meds:.acetaminophen, albuterol, docusate sodium, glucagon (human  recombinant), haloperidol lactate, hydrALAZINE, lidocaine, polyethylene glycol   PHYSICAL EXAM: Vital signs: Vitals:   03/12/20 2201 03/12/20 2351 03/13/20 0319 03/13/20 0730  BP: (!) 129/102 134/73 132/64 (!) 153/83  Pulse: 84 66 65 (!) 44  Resp:  12 14 11   Temp:  98.2 F (36.8 C) 98.1 F (36.7 C) 98.2 F (36.8 C)  TempSrc:  Oral Oral Axillary  SpO2:  100% 100% 100%  Weight:   93.6 kg   Height:       Filed Weights   03/11/20 0600 03/12/20 0407 03/13/20 0319  Weight: 93.9 kg 91.9 kg 93.6 kg   Body mass index is 36.55 kg/m.   Gen Exam:Alert awake-not in any distress HEENT:atraumatic, normocephalic Chest: B/L clear to auscultation anteriorly CVS:S1S2 regular Abdomen:soft non tender, non distended Extremities:no edema Neurology: Non focal Skin: no rash  I have personally reviewed following labs and imaging studies  LABORATORY DATA: CBC: Recent Labs  Lab 03/08/20 1043 03/08/20 1051 03/10/20 1100  WBC 13.1*  --  7.1  NEUTROABS 10.0*  --   --   HGB 14.2 15.0 12.7  HCT 44.7 44.0 39.7  MCV 87.6  --  87.8  PLT PLATELET CLUMPS NOTED ON SMEAR, COUNT APPEARS ADEQUATE  --  235    Basic Metabolic Panel: Recent Labs  Lab 03/08/20 1043 03/08/20 1051 03/10/20 1100 03/12/20 0306  NA 130* 128* 140 139  K 4.0 3.8 4.3 3.8  CL 88* 87* 105 106  CO2 27  --  27 23  GLUCOSE 178* 184* 246* 184*  BUN 34* 43* 14 9  CREATININE 3.40* 3.50* 1.22* 1.01*  CALCIUM 9.1  --  8.6* 8.7*    GFR: Estimated Creatinine Clearance: 66.8 mL/min (A) (by C-G formula based on SCr of 1.01 mg/dL (H)).  Liver Function Tests: Recent Labs  Lab 03/08/20 1043  AST 24  ALT 16  ALKPHOS 99  BILITOT 0.5  PROT 6.9  ALBUMIN 3.2*   No results for input(s): LIPASE, AMYLASE in the last 168 hours. No results for input(s): AMMONIA in the last 168 hours.  Coagulation Profile: No results for input(s): INR, PROTIME in the last 168 hours.  Cardiac Enzymes: No results for input(s): CKTOTAL, CKMB,  CKMBINDEX, TROPONINI in the last 168 hours.  BNP (last 3 results) No results for input(s): PROBNP in the last 8760 hours.  Lipid Profile: No results for input(s): CHOL, HDL, LDLCALC, TRIG, CHOLHDL, LDLDIRECT in the last 72 hours.  Thyroid Function Tests: No results for input(s): TSH, T4TOTAL, FREET4, T3FREE, THYROIDAB in the last 72 hours.  Anemia Panel: No results for input(s): VITAMINB12, FOLATE, FERRITIN, TIBC, IRON, RETICCTPCT in the last 72 hours.  Urine analysis:    Component Value Date/Time   COLORURINE STRAW (A) 03/08/2020 1306   APPEARANCEUR CLEAR 03/08/2020 1306   LABSPEC 1.022 03/08/2020 1306   PHURINE 5.0 03/08/2020 1306   GLUCOSEU >=500 (A) 03/08/2020 1306   HGBUR NEGATIVE 03/08/2020 1306   BILIRUBINUR NEGATIVE 03/08/2020  [place] 03/08/2020 [place] 03/08/2020 1306   UROBILINOGEN 1.0 06/01/2015 0104   NITRITE NEGATIVE 03/08/2020 1306   LEUKOCYTESUR NEGATIVE 03/08/2020 1306    Sepsis Labs: Lactic Acid, Venous    Component Value Date/Time   LATICACIDVEN 0.9 03/08/2020 1919    MICROBIOLOGY: Recent Results (from the past 240 hour(s))  SARS Coronavirus 2 by RT PCR (hospital order, performed in [place] hospital lab) Nasopharyngeal Nasopharyngeal Swab     Status: None   Collection Time: 03/08/20 11:26 AM   Specimen: Nasopharyngeal Swab  Result Value Ref Range Status   SARS Coronavirus 2 NEGATIVE NEGATIVE Final    Comment: (NOTE) SARS-CoV-2 target nucleic acids are NOT DETECTED.  The SARS-CoV-2 RNA is generally detectable in upper and lower respiratory specimens during the acute phase of infection. The lowest concentration of SARS-CoV-2 viral copies this assay can detect is 250 copies / mL. A negative result does not preclude SARS-CoV-2 infection and should not be used as the sole basis for treatment or other patient management decisions.  A negative result may occur with improper specimen collection / handling,  submission of specimen other than nasopharyngeal swab, presence of viral mutation(s) within the areas targeted by this assay, and inadequate number of viral copies (<250 copies / mL). A negative result must be combined with clinical observations, patient history, and epidemiological information.  Fact Sheet for Patients:   StrictlyIdeas.no  Fact Sheet for Healthcare Providers: BankingDealers.co.za  This test is not yet approved or  cleared by the [place] FDA and has been authorized for detection and/or diagnosis of SARS-CoV-2 by FDA under an Emergency Use Authorization (EUA).  This EUA will remain in effect (meaning this test can be used) for the duration of the COVID-19 declaration under Section 564(b)(1) of the Act, 21 U.S.C. section 360bbb-3(b)(1), unless the authorization is terminated or revoked sooner.  Performed at [facility], [place] [address], [place], [place] [address]   Urine culture     Status: Abnormal   Collection Time: 03/08/20  1:16 PM   Specimen: Urine, Random  Result Value Ref Range Status   Specimen Description URINE, RANDOM  Final   Special Requests NONE  Final   Culture (A)  Final    <10,000 COLONIES/mL INSIGNIFICANT GROWTH Performed at [facility], [place] [address] [place], [place] [address]    Report Status 03/09/2020 FINAL  Final  Culture, blood (routine x 2)     Status: None (Preliminary result)   Collection Time: 03/08/20  1:36 PM   Specimen: BLOOD  Result Value Ref Range Status   Specimen Description BLOOD BLOOD LEFT HAND  Final   Special Requests   Final    BOTTLES DRAWN AEROBIC ONLY Blood Culture results may not be optimal due to an inadequate volume of blood received in culture bottles   Culture   Final    NO GROWTH 4 DAYS Performed at [facility], [place] [address], [place], [place] [address]    Report Status PENDING  Incomplete  Culture, blood (routine x 2)     Status: None  (Preliminary result)   Collection Time: 03/08/20  7:19 PM   Specimen: BLOOD LEFT FOREARM  Result Value Ref Range Status   Specimen Description BLOOD LEFT FOREARM  Final   Special Requests AEROBIC BOTTLE ONLY Blood Culture adequate volume  Final   Culture   Final    NO GROWTH 4 DAYS Performed at [facility]  Lab, [address], [place],  [address]    Report Status PENDING  Incomplete  MRSA PCR Screening     Status: Abnormal   Collection Time: 03/09/20  9:41 AM   Specimen: Nasal Mucosa; Nasopharyngeal  Result Value Ref Range Status   MRSA by PCR POSITIVE (A) NEGATIVE Final    Comment:        The GeneXpert MRSA Assay (FDA approved for NASAL specimens only), is one component of a comprehensive MRSA colonization surveillance program. It is not intended to diagnose MRSA infection nor to guide or monitor treatment for MRSA infections. CRITICAL RESULT CALLED TO, READ BACK BY AND VERIFIED WITH: RN Maggie Font AT 1121 03/09/20 BY MM Performed at [facility], [place] [address] [place],  [address]     RADIOLOGY STUDIES/RESULTS: No results found.   LOS: 5 days   Oren Binet, MD  [facility]    To contact the attending provider between 7A-7P or the covering provider during after hours 7P-7A, please log into the web site [email or web address] and access using universal [place] password for that web site. If you do not have the password, please call the hospital operator.  03/13/2020, 1:32 PM

## 2020-03-14 LAB — GLUCOSE, CAPILLARY
Glucose-Capillary: 182 mg/dL — ABNORMAL HIGH (ref 70–99)
Glucose-Capillary: 164 mg/dL — ABNORMAL HIGH (ref 70–99)
Glucose-Capillary: 220 mg/dL — ABNORMAL HIGH (ref 70–99)
Glucose-Capillary: 315 mg/dL — ABNORMAL HIGH (ref 70–99)
Glucose-Capillary: 245 mg/dL — ABNORMAL HIGH (ref 70–99)

## 2020-03-14 NOTE — Clinical Social Work Note (Signed)
SNF recommended for patient and SNF search initiated, however no bed offers at this time. CSW confirmed with Benancio Deeds with [facility] today that they cannot take patient.  A CSW will continue search for SNF for patient.  Nesiah Jump Givens, MSW, LCSW Licensed Clinical Social Worker [place] [phone]

## 2020-03-14 NOTE — Progress Notes (Signed)
Physical Therapy Treatment Patient Details Name: Susan [name] MRN: [id number] DOB: 16-May-1961 Today's Date: 03/14/2020    History of Present Illness Susan [name] is a 59 year old female admitted on 03/08/20 with a PMH significant for tobacco abuse, HTN, HIV, DM, diabetic neuropathy, seizure disorder, bipolar disease, schizophrenia, and IVDA. She was recently hospitalized from March to May 2021 with encephalopathy secondary to overdose which was complicated by MRSA pneumonia, pneumococcal bacteremia, septic shock, and respiratory failure requiring tracheostomy and G-tube placement. She was decannulated on April 28th, 2021. During her prior hospitalization she was noted to have acute vs subacute bilateral MCA infarcts. During this admission, she was found by EMS to be lethargic, confused, hypotensive, and hypoglycemic with pinpoint pupils. She was noted to have left-sided neglect and left hemiparesis. CT Head showed interval right PCA stroke, potentially acute on chronic changes.    PT Comments    Pt able to walk to the bathroom and back with min hand held assist and participate in bathing seated at the sink.  She had difficulty reaching her feet and she had to be reminded at times of what we were doing (she looked surprised when we got to the bathroom after she requested to go to the bathroom and said, "now what?".  Pt remains appropriate for SNF level rehab at discharge.  PT will continue to follow acutely for safe mobility progression   Follow Up Recommendations  SNF     Equipment Recommendations  None recommended by PT    Recommendations for Other Services   NA     Precautions / Restrictions Precautions Precautions: Fall Precaution Comments: decreased cog limits safety awareness and limited STM    Mobility  Bed Mobility Overal bed mobility: Modified Independent                Transfers Overall transfer level: Needs assistance Equipment used: 1 person hand held  assist Transfers: Sit to/from Stand Sit to Stand: Min assist         General transfer comment: Min assist to power up and stabilize in standing.   Ambulation/Gait Ambulation/Gait assistance: Min assist Gait Distance (Feet): 15 Feet (x2) Assistive device: 1 person hand held assist Gait Pattern/deviations: Step-to pattern;Decreased stride length;Shuffle;Wide base of support Gait velocity: decrease Gait velocity interpretation: 1.31 - 2.62 ft/sec, indicative of limited community ambulator General Gait Details: Pt takes short, shuffling steps with minimal clearance, wide BOS with increased lateral movement.       Modified Rankin (Stroke Patients Only) Modified Rankin (Stroke Patients Only) Pre-Morbid Rankin Score: Moderate disability Modified Rankin: Moderately severe disability     Balance Overall balance assessment: Needs assistance Sitting-balance support: Feet supported;No upper extremity supported Sitting balance-Leahy Scale: Good Sitting balance - Comments: unable to reach feet to clean feet or donn/doff socks   Standing balance support: Single extremity supported Standing balance-Leahy Scale: Poor Standing balance comment: needs at least one hand on external support for standing balance.                             Cognition Arousal/Alertness: Awake/alert Behavior During Therapy: Impulsive Overall Cognitive Status: History of cognitive impairments - at baseline Area of Impairment: Safety/judgement;Awareness;Memory                     Memory: Decreased short-term memory Following Commands: Follows one step commands inconsistently Safety/Judgement: Decreased awareness of safety;Decreased awareness of deficits Awareness: Emergent   General Comments: Pt quick to  move despite balance deficits, forgets what we are doing in the middle of doing it ~ 5 mins rest             Pertinent Vitals/Pain Pain Assessment: No/denies pain           PT  Goals (current goals can now be found in the care plan section) Acute Rehab PT Goals Patient Stated Goal: wants to go to the bathroom and get washed up Progress towards PT goals: Progressing toward goals    Frequency    Min 3X/week      PT Plan Current plan remains appropriate       AM-PAC PT "6 Clicks" Mobility   Outcome Measure  Help needed turning from your back to your side while in a flat bed without using bedrails?: None Help needed moving from lying on your back to sitting on the side of a flat bed without using bedrails?: None Help needed moving to and from a bed to a chair (including a wheelchair)?: A Little Help needed standing up from a chair using your arms (e.g., wheelchair or bedside chair)?: A Little Help needed to walk in hospital room?: A Little Help needed climbing 3-5 steps with a railing? : A Little 6 Click Score: 20    End of Session   Activity Tolerance: Patient tolerated treatment well Patient left: in bed;with call bell/phone within reach;with bed alarm set   PT Visit Diagnosis: Other abnormalities of gait and mobility (R26.89);Difficulty in walking, not elsewhere classified (R26.2);Other symptoms and signs involving the nervous system (M38.466)     Time: 5993-5701 PT Time Calculation (min) (ACUTE ONLY): 23 min  Charges:  $Gait Training: 8-22 mins $Therapeutic Activity: 8-22 mins                    Verdene Lennert, PT, DPT  Acute Rehabilitation [phone] pager [phone]) [phone] office

## 2020-03-14 NOTE — Progress Notes (Signed)
Inpatient Diabetes Program Recommendations  AACE/ADA: New Consensus Statement on Inpatient Glycemic Control (2015)  Target Ranges:  Prepandial:   less than 140 mg/dL      Peak postprandial:   less than 180 mg/dL (1-2 hours)      Critically ill patients:  140 - 180 mg/dL   Lab Results  Component Value Date   GLUCAP 164 (H) 03/14/2020   HGBA1C 11.7 (H) 03/08/2020    Review of Glycemic Control  Diabetes history: DM Outpatient Diabetes medications: Lantus 30 units qd + Humalog 0-20 tid meal coverage Current orders for Inpatient glycemic control: Lantus 10 units qd +Novolog correction 0-15 units qid  Inpatient Diabetes Program Recommendations:   -Decrease Novolog hs correction to 0-5 units -Add Novolog 2 units tid meal coverage if eats 50%  Thank you, Bethena Roys E. Dontravious Camille, RN, MSN, CDE  Diabetes Coordinator Inpatient Glycemic Control Team Team Pager [phone] (8am-5pm) 03/14/2020 10:28 AM

## 2020-03-14 NOTE — Progress Notes (Signed)
PROGRESS NOTE        PATIENT DETAILS Name: Susan [name] Age: 59 y.o. Sex: female Date of Birth: 1961/06/07 Admit Date: 03/08/2020 Admitting Physician Renee Pain, MD JOA:[email or web address], Real Cons, MD  Brief Narrative: Patient is a 59 y.o. female history of HTN, HIV, DM, seizure disorder, bipolar disorder, prolonged hospitalization from 3/10-5/8 for respiratory failure/encephalopathy/septic shock/CVA-required intubation-difficult to wean off ventilator-tracheostomy/PEG tube placement-she was eventually decannulated on 4/28-discharged to SNF-presented to the hospital on 7/6 for altered mental status, hypoglycemia/hypotension-thought to be multifactorial-from unintentional narcotic overdose-she was transferred to the ICU-required Narcan infusion-upon stability-she was transferred to [facility] service.  MRI of the brain done this admission was positive for acute/subacute infarct involving the right PCA/right MCA watershed area.  Significant events: 7/6>> presented to [facility] with confusion/hypoglycemia/hypotension found to have AKI-requiring several doses of Narcan-eventually Narcan drip for presumed unintentional narcotic overdose. 7/8>> transfer to [facility] from ICU.  Significant studies: 3/16>> TEE: EF 55-60% 3/23>> daughter Doppler: No significant stenosis. 3/23>> lower extremity Doppler: No DVT. 7/6>> chest x-ray: Low volume chest with indistinct opacity-that could be scarring/active infection 7/6>> CT head: Age-indeterminate right parasagittal occipital infarct with bilateral extension. 7/6>> CT C-spine: No acute abnormality noted in the C-spine 7/6>> renal ultrasound: Negative for obstructive uropathy 7/7>> MRI brain: Acute to subacute infarct involving the right PCA and right MCA/PCA watershed area 7/7>> MRI brain: Stable intracranial MRA since March-with no large vessel occlusion. 7/7>> EEG: No seizures.  Antimicrobial therapy: Unasyn: 7/6>>7/11  Microbiology  data: 7/6>> urine culture:<10,000 colonies/mL insignificant growth 7/6>> blood culture: No growth  Procedures : 7/8>> right jugular central line by IR (pulled out by patient later on 7/8 evening)  Consults: PCCM Neurology  DVT Prophylaxis : SCDs Start: 03/08/20 2005 heparin injection 5,000 Units Start: 03/08/20 1500   Subjective: No major issues overnight-stable-no chest pain or shortness of breath-awaiting SNF bed.  Assessment/Plan: Acute toxic metabolic encephalopathy: Multifactorial-likely narcotic overdose, hypoglycemia, AKI and sepsis physiology all contributing.  Mental status improved after treatment of underlying conditions.  Suspected narcotic overdose: Although urine drug screen negative-she responded well (per PCCM note) to Narcan infusion.  Per H&P (subsequently confirmed by daughter on 7/9 by this MD)-patient apparently was started on Nucynta 1 day prior to this hospital stay-(not listed on home med list)-urine drug screen negative as well.  Avoid narcotics as much as possible.  Hypoglycemia: Suspect likely secondary to poor oral intake in the setting of confusion-resolved-CBGs stable with SSI.  Sepsis acute hypoxic respiratory failure likely secondary to aspiration pneumonia: Sepsis physiology has resolved-blood cultures negative-Unasyn completed as off 7/11.  Has been titrated off oxygen and stable on room air.  AKI: Likely hemodynamically mediated-creatinine has significantly improved with supportive care.  Renal ultrasound was negative for hydronephrosis.  Follow renal function periodically.  Acute CVA: Apparently had transient left-sided weakness that has resolved-see imaging studies as noted above-LDL 52, A1c 11.7-had stroke work-up during her most recent admission-neurology recommending aspirin.  HIV: Patient CD4 count on 6/2 882-continue antiretrovirals.  HTN: BP stable-continue amlodipine and Coreg.  Follow and adjust.  DM-2 (A1c 11.7 on 7/6): Had  hypoglycemia on admission-diet has now stabilized-restarted on Lantus 10 units-SSI-CBGs relatively stable.  Follow closely.  Given her numerous medical comorbidities-may be best to allow some amount of permissive hyperglycemia.    Recent Labs    03/14/20 0527 03/14/20 0731 03/14/20 1155  GLUCAP 182*  164* 220*   Bipolar disorder/schizophrenia: Remains stable-continue Seroquel, doxepin  Dysphagia: Recently had PEG tube removed-seen by SLP-has been upgraded to a regular diet by SLP on 7/11  History of substance use: UDS positive for cannabinoids-suspected to have unintentional narcotic overdose on presentation.    Chronic back pain: Avoid narcotics as much as possible-continue Neurontin, Tylenol as needed and Lidoderm patch.  Nutrition Problem: Nutrition Problem: Increased nutrient needs Etiology: acute illness Signs/Symptoms: estimated needs Interventions: Magic cup  Obesity: Estimated body mass index is 36.55 kg/m as calculated from the following:   Height as of this encounter: 5\' 3"  (1.6 m).   Weight as of this encounter: 93.6 kg.    Diet: Diet Order            Diet regular Room service appropriate? Yes with Assist; Fluid consistency: Thin  Diet effective now                  Code Status: Full code   Family Communication: Daughter over the phone on 7/9-we will update over the next few days  Disposition Plan: Status is: Inpatient  Remains inpatient appropriate because:Inpatient level of care appropriate due to severity of illness   Dispo: The patient is from: Home              Anticipated d/c is to: TBD              Anticipated d/c date is: 1 days              Patient currently is  medically stable to d/c.   Barriers to Discharge: Awaiting SNF  Antimicrobial agents: Anti-infectives (From admission, onward)   Start     Dose/Rate Route Frequency Ordered Stop   03/10/20 1400  Ampicillin-Sulbactam (UNASYN) 3 g in sodium chloride 0.9 % 100 mL IVPB        3  g 200 mL/hr over 30 Minutes Intravenous Every 8 hours 03/10/20 1324 03/14/20 0559   03/09/20 0600  ampicillin-sulbactam (UNASYN) 1.5 g in sodium chloride 0.9 % 100 mL IVPB  Status:  Discontinued        1.5 g 200 mL/hr over 30 Minutes Intravenous Every 12 hours 03/08/20 1716 03/10/20 1324   03/08/20 1930  bictegravir-emtricitabine-tenofovir AF (BIKTARVY) 50-200-25 MG per tablet 1 tablet     Discontinue     1 tablet Oral Daily 03/08/20 1453     03/08/20 1515  Ampicillin-Sulbactam (UNASYN) 3 g in sodium chloride 0.9 % 100 mL IVPB        3 g 200 mL/hr over 30 Minutes Intravenous  Once 03/08/20 1500 03/08/20 2126       Time spent: 25- minutes-Greater than 50% of this time was spent in counseling, explanation of diagnosis, planning of further management, and coordination of care.  MEDICATIONS: Scheduled Meds: . amLODipine  5 mg Oral Daily  . aspirin  81 mg Oral Daily  . bictegravir-emtricitabine-tenofovir AF  1 tablet Oral Daily  . carvedilol  3.125 mg Oral BID  . chlorhexidine  15 mL Mouth Rinse BID  . Chlorhexidine Gluconate Cloth  6 each Topical Daily  . doxepin  50 mg Oral QHS  . gabapentin  100 mg Oral BID  . heparin  5,000 Units Subcutaneous Q8H  . insulin aspart  0-15 Units Subcutaneous TID AC & HS  . insulin glargine  10 Units Subcutaneous Daily  . lidocaine  1 patch Transdermal Q24H  . mouth rinse  15 mL Mouth Rinse q12n4p  . pantoprazole  40 mg Oral Daily  . QUEtiapine  200 mg Oral BID  . rosuvastatin  20 mg Oral Daily  . umeclidinium-vilanterol  1 puff Inhalation Daily   Continuous Infusions: . sodium chloride Stopped (03/09/20 1213)   PRN Meds:.acetaminophen, albuterol, docusate sodium, glucagon (human recombinant), haloperidol lactate, hydrALAZINE, lidocaine, polyethylene glycol   PHYSICAL EXAM: Vital signs: Vitals:   03/13/20 1830 03/13/20 2128 03/14/20 0556 03/14/20 0900  BP: 132/82 (!) 139/103 (!) 155/93 (!) 146/77  Pulse:  72 75   Resp: 12 12 17 19   Temp:  98.5 F (36.9 C) 98.3 F (36.8 C) 98.2 F (36.8 C)   TempSrc: Oral Oral Oral   SpO2: 99% 98% 99%   Weight:   93.6 kg   Height:       Filed Weights   03/12/20 0407 03/13/20 0319 03/14/20 0556  Weight: 91.9 kg 93.6 kg 93.6 kg   Body mass index is 36.55 kg/m.   Gen Exam:Alert awake-not in any distress HEENT:atraumatic, normocephalic Chest: B/L clear to auscultation anteriorly CVS:S1S2 regular Abdomen:soft non tender, non distended Extremities:no edema Neurology: Non focal Skin: no rash  I have personally reviewed following labs and imaging studies  LABORATORY DATA: CBC: Recent Labs  Lab 03/08/20 1043 03/08/20 1051 03/10/20 1100  WBC 13.1*  --  7.1  NEUTROABS 10.0*  --   --   HGB 14.2 15.0 12.7  HCT 44.7 44.0 39.7  MCV 87.6  --  87.8  PLT PLATELET CLUMPS NOTED ON SMEAR, COUNT APPEARS ADEQUATE  --  401    Basic Metabolic Panel: Recent Labs  Lab 03/08/20 1043 03/08/20 1051 03/10/20 1100 03/12/20 0306  NA 130* 128* 140 139  K 4.0 3.8 4.3 3.8  CL 88* 87* 105 106  CO2 27  --  27 23  GLUCOSE 178* 184* 246* 184*  BUN 34* 43* 14 9  CREATININE 3.40* 3.50* 1.22* 1.01*  CALCIUM 9.1  --  8.6* 8.7*    GFR: Estimated Creatinine Clearance: 66.8 mL/min (A) (by C-G formula based on SCr of 1.01 mg/dL (H)).  Liver Function Tests: Recent Labs  Lab 03/08/20 1043  AST 24  ALT 16  ALKPHOS 99  BILITOT 0.5  PROT 6.9  ALBUMIN 3.2*   No results for input(s): LIPASE, AMYLASE in the last 168 hours. No results for input(s): AMMONIA in the last 168 hours.  Coagulation Profile: No results for input(s): INR, PROTIME in the last 168 hours.  Cardiac Enzymes: No results for input(s): CKTOTAL, CKMB, CKMBINDEX, TROPONINI in the last 168 hours.  BNP (last 3 results) No results for input(s): PROBNP in the last 8760 hours.  Lipid Profile: No results for input(s): CHOL, HDL, LDLCALC, TRIG, CHOLHDL, LDLDIRECT in the last 72 hours.  Thyroid Function Tests: No results for  input(s): TSH, T4TOTAL, FREET4, T3FREE, THYROIDAB in the last 72 hours.  Anemia Panel: No results for input(s): VITAMINB12, FOLATE, FERRITIN, TIBC, IRON, RETICCTPCT in the last 72 hours.  Urine analysis:    Component Value Date/Time   COLORURINE STRAW (A) 03/08/2020 1306   APPEARANCEUR CLEAR 03/08/2020 1306   LABSPEC 1.022 03/08/2020 1306   PHURINE 5.0 03/08/2020 1306   GLUCOSEU >=500 (A) 03/08/2020 1306   HGBUR NEGATIVE 03/08/2020 1306   BILIRUBINUR NEGATIVE 03/08/2020 1306   KETONESUR NEGATIVE 03/08/2020 1306   PROTEINUR NEGATIVE 03/08/2020 1306   UROBILINOGEN 1.0 06/01/2015 0104   NITRITE NEGATIVE 03/08/2020 1306   LEUKOCYTESUR NEGATIVE 03/08/2020 1306    Sepsis Labs: Lactic Acid, Venous    Component Value  Date/Time   LATICACIDVEN 0.9 03/08/2020 1919    MICROBIOLOGY: Recent Results (from the past 240 hour(s))  SARS Coronavirus 2 by RT PCR (hospital order, performed in [facility] hospital lab) Nasopharyngeal Nasopharyngeal Swab     Status: None   Collection Time: 03/08/20 11:26 AM   Specimen: Nasopharyngeal Swab  Result Value Ref Range Status   SARS Coronavirus 2 NEGATIVE NEGATIVE Final    Comment: (NOTE) SARS-CoV-2 target nucleic acids are NOT DETECTED.  The SARS-CoV-2 RNA is generally detectable in upper and lower respiratory specimens during the acute phase of infection. The lowest concentration of SARS-CoV-2 viral copies this assay can detect is 250 copies / mL. A negative result does not preclude SARS-CoV-2 infection and should not be used as the sole basis for treatment or other patient management decisions.  A negative result may occur with improper specimen collection / handling, submission of specimen other than nasopharyngeal swab, presence of viral mutation(s) within the areas targeted by this assay, and inadequate number of viral copies (<250 copies / mL). A negative result must be combined with clinical observations, patient history, and  epidemiological information.  Fact Sheet for Patients:   StrictlyIdeas.no  Fact Sheet for Healthcare Providers: BankingDealers.co.za  This test is not yet approved or  cleared by the [place] FDA and has been authorized for detection and/or diagnosis of SARS-CoV-2 by FDA under an Emergency Use Authorization (EUA).  This EUA will remain in effect (meaning this test can be used) for the duration of the COVID-19 declaration under Section 564(b)(1) of the Act, 21 U.S.C. section 360bbb-3(b)(1), unless the authorization is terminated or revoked sooner.  Performed at [facility], [place] [address] [place], [place] [address]   Urine culture     Status: Abnormal   Collection Time: 03/08/20  1:16 PM   Specimen: Urine, Random  Result Value Ref Range Status   Specimen Description URINE, RANDOM  Final   Special Requests NONE  Final   Culture (A)  Final    <10,000 COLONIES/mL INSIGNIFICANT GROWTH Performed at [facility], [place] [address], [place], [place] [address]    Report Status 03/09/2020 FINAL  Final  Culture, blood (routine x 2)     Status: None   Collection Time: 03/08/20  1:36 PM   Specimen: BLOOD  Result Value Ref Range Status   Specimen Description BLOOD BLOOD LEFT HAND  Final   Special Requests   Final    BOTTLES DRAWN AEROBIC ONLY Blood Culture results may not be optimal due to an inadequate volume of blood received in culture bottles   Culture   Final    NO GROWTH 5 DAYS Performed at [facility], [place] [address], [place], [place] [address]    Report Status 03/13/2020 FINAL  Final  Culture, blood (routine x 2)     Status: None   Collection Time: 03/08/20  7:19 PM   Specimen: BLOOD LEFT FOREARM  Result Value Ref Range Status   Specimen Description BLOOD LEFT FOREARM  Final   Special Requests AEROBIC BOTTLE ONLY Blood Culture adequate volume  Final   Culture   Final    NO GROWTH 5 DAYS Performed at [facility], [place] [address] [place], [place] [address]    Report Status 03/13/2020 FINAL  Final  MRSA PCR Screening     Status: Abnormal   Collection Time: 03/09/20  9:41 AM   Specimen: Nasal Mucosa; Nasopharyngeal  Result Value Ref Range Status  MRSA by PCR POSITIVE (A) NEGATIVE Final    Comment:        The GeneXpert MRSA Assay (FDA approved for NASAL specimens only), is one component of a comprehensive MRSA colonization surveillance program. It is not intended to diagnose MRSA infection nor to guide or monitor treatment for MRSA infections. CRITICAL RESULT CALLED TO, READ BACK BY AND VERIFIED WITH: RN Maggie Font AT 1121 03/09/20 BY MM Performed at [facility], [place] [address] [place], [place] [address]     RADIOLOGY STUDIES/RESULTS: No results found.   LOS: 6 days   Oren Binet, MD  [facility]    To contact the attending provider between 7A-7P or the covering provider during after hours 7P-7A, please log into the web site [email or web address] and access using universal [place] password for that web site. If you do not have the password, please call the hospital operator.  03/14/2020, 1:26 PM

## 2020-03-15 LAB — GLUCOSE, CAPILLARY
Glucose-Capillary: 326 mg/dL — ABNORMAL HIGH (ref 70–99)
Glucose-Capillary: 164 mg/dL — ABNORMAL HIGH (ref 70–99)
Glucose-Capillary: 180 mg/dL — ABNORMAL HIGH (ref 70–99)
Glucose-Capillary: 187 mg/dL — ABNORMAL HIGH (ref 70–99)

## 2020-03-15 MED ORDER — INSULIN ASPART 100 UNIT/ML ~~LOC~~ SOLN
4.0000 [IU] | Freq: Three times a day (TID) | SUBCUTANEOUS | Status: DC
  Administered 2020-03-15 – 2020-03-21 (×16): 4 [IU] via SUBCUTANEOUS

## 2020-03-15 MED ORDER — INSULIN GLARGINE 100 UNIT/ML ~~LOC~~ SOLN
15.0000 [IU] | Freq: Every day | SUBCUTANEOUS | Status: DC
  Administered 2020-03-16 – 2020-03-17 (×2): 15 [IU] via SUBCUTANEOUS
  Filled 2020-03-15 (×2): qty 0.15

## 2020-03-15 NOTE — Progress Notes (Signed)
PROGRESS NOTE        PATIENT DETAILS Name: Susan [name] Age: 59 y.o. Sex: female Date of Birth: 07-May-1961 Admit Date: 03/08/2020 Admitting Physician Renee Pain, MD XHB:[id number], Real Cons, MD  Brief Narrative: Patient is a 59 y.o. female history of HTN, HIV, DM, seizure disorder, bipolar disorder, prolonged hospitalization from 3/10-5/8 for respiratory failure/encephalopathy/septic shock/CVA-required intubation-difficult to wean off ventilator-tracheostomy/PEG tube placement-she was eventually decannulated on 4/28-discharged to SNF-presented to the hospital on 7/6 for altered mental status, hypoglycemia/hypotension-thought to be multifactorial-from unintentional narcotic overdose-she was transferred to the ICU-required Narcan infusion-upon stability-she was transferred to [facility] service.  MRI of the brain done this admission was positive for acute/subacute infarct involving the right PCA/right MCA watershed area.  Significant events: 7/6>> presented to [facility] with confusion/hypoglycemia/hypotension found to have AKI-requiring several doses of Narcan-eventually Narcan drip for presumed unintentional narcotic overdose. 7/8>> transfer to [facility] ([facility]) from ICU.  Significant studies: 3/16>> TEE: EF 55-60% 3/23>> daughter Doppler: No significant stenosis. 3/23>> lower extremity Doppler: No DVT. 7/6>> chest x-ray: Low volume chest with indistinct opacity-that could be scarring/active infection 7/6>> CT head: Age-indeterminate right parasagittal occipital infarct with bilateral extension. 7/6>> CT C-spine: No acute abnormality noted in the C-spine 7/6>> renal ultrasound: Negative for obstructive uropathy 7/7>> MRI brain: Acute to subacute infarct involving the right PCA and right MCA/PCA watershed area 7/7>> MRI brain: Stable intracranial MRA since March-with no large vessel occlusion. 7/7>> EEG: No seizures.  Antimicrobial therapy: Unasyn: 7/6>>7/11  Microbiology  data: 7/6>> urine culture:<10,000 colonies/mL insignificant growth 7/6>> blood culture: No growth  Procedures : 7/8>> right jugular central line by IR (pulled out by patient later on 7/8 evening)  Consults: PCCM Neurology  DVT Prophylaxis : SCDs Start: 03/08/20 2005 heparin injection 5,000 Units Start: 03/08/20 1500   Subjective: Lying comfortably in bed-pain in her back is stable.  No chest pain or shortness of breath.  Assessment/Plan: Acute toxic metabolic encephalopathy: Multifactorial-likely narcotic overdose, hypoglycemia, AKI and sepsis physiology all contributing.  Mental status improved after treatment of underlying conditions.  Suspected narcotic overdose: Although urine drug screen negative-she responded well (per PCCM note) to Narcan infusion.  Per H&P (subsequently confirmed by daughter on 7/9 by this MD)-patient apparently was started on Nucynta 1 day prior to this hospital stay-(not listed on home med list)-urine drug screen negative as well.  Avoid narcotics as much as possible.  Hypoglycemia: Suspect likely secondary to poor oral intake in the setting of confusion-resolved-CBGs stable with SSI.  Sepsis acute hypoxic respiratory failure likely secondary to aspiration pneumonia: Sepsis physiology has resolved-blood cultures negative-Unasyn completed as off 7/11.  Has been titrated off oxygen and stable on room air.  AKI: Likely hemodynamically mediated-creatinine has significantly improved with supportive care.  Renal ultrasound was negative for hydronephrosis.  Follow renal function periodically.  Acute CVA: Apparently had transient left-sided weakness that has resolved-see imaging studies as noted above-LDL 52, A1c 11.7-had stroke work-up during her most recent admission-neurology recommending aspirin.  HIV: Patient CD4 count on 6/2 882-continue antiretrovirals.  HTN: BP stable-continue amlodipine and Coreg.  Follow and adjust.  DM-2 (A1c 11.7 on 7/6): Had  hypoglycemia on admission-diet has now stabilized-CBGs creeping up-increase Lantus to 15 units, add 4 units of NovoLog with meals-continue SSI.  Follow and adjust.  Would avoid tight glycemic control-and allow some amount of permissive hyperglycemia in this patient with multiple medical  issues.    Recent Labs    03/14/20 2119 03/15/20 0751 03/15/20 1157  GLUCAP 245* 326* 164*   Bipolar disorder/schizophrenia: Remains stable-continue Seroquel, doxepin  Dysphagia: Recently had PEG tube removed-seen by SLP-has been upgraded to a regular diet by SLP on 7/11  History of substance use: UDS positive for cannabinoids-suspected to have unintentional narcotic overdose on presentation.    Chronic back pain: Avoid narcotics as much as possible-continue Neurontin, Tylenol as needed and Lidoderm patch.  Nutrition Problem: Nutrition Problem: Increased nutrient needs Etiology: acute illness Signs/Symptoms: estimated needs Interventions: Magic cup  Obesity: Estimated body mass index is 36.71 kg/m as calculated from the following:   Height as of this encounter: 5\' 3"  (1.6 m).   Weight as of this encounter: 94 kg.    Diet: Diet Order            Diet regular Room service appropriate? Yes with Assist; Fluid consistency: Thin  Diet effective now                  Code Status: Full code   Family Communication: Daughter over the phone on 7/13  Disposition Plan: Status is: Inpatient  Remains inpatient appropriate because:Inpatient level of care appropriate due to severity of illness  Dispo: The patient is from: Home              Anticipated d/c is to: TBD              Anticipated d/c date is: 1 days              Patient currently is  medically stable to d/c.   Barriers to Discharge: Awaiting SNF  Antimicrobial agents: Anti-infectives (From admission, onward)   Start     Dose/Rate Route Frequency Ordered Stop   03/10/20 1400  Ampicillin-Sulbactam (UNASYN) 3 g in sodium chloride 0.9  % 100 mL IVPB  Status:  Discontinued        3 g 200 mL/hr over 30 Minutes Intravenous Every 8 hours 03/10/20 1324 03/14/20 0559   03/09/20 0600  ampicillin-sulbactam (UNASYN) 1.5 g in sodium chloride 0.9 % 100 mL IVPB  Status:  Discontinued        1.5 g 200 mL/hr over 30 Minutes Intravenous Every 12 hours 03/08/20 1716 03/10/20 1324   03/08/20 1930  bictegravir-emtricitabine-tenofovir AF (BIKTARVY) 50-200-25 MG per tablet 1 tablet     Discontinue     1 tablet Oral Daily 03/08/20 1453     03/08/20 1515  Ampicillin-Sulbactam (UNASYN) 3 g in sodium chloride 0.9 % 100 mL IVPB        3 g 200 mL/hr over 30 Minutes Intravenous  Once 03/08/20 1500 03/08/20 2126       Time spent: 15- minutes-Greater than 50% of this time was spent in counseling, explanation of diagnosis, planning of further management, and coordination of care.  MEDICATIONS: Scheduled Meds: . amLODipine  5 mg Oral Daily  . aspirin  81 mg Oral Daily  . bictegravir-emtricitabine-tenofovir AF  1 tablet Oral Daily  . carvedilol  3.125 mg Oral BID  . chlorhexidine  15 mL Mouth Rinse BID  . Chlorhexidine Gluconate Cloth  6 each Topical Daily  . doxepin  50 mg Oral QHS  . gabapentin  100 mg Oral BID  . heparin  5,000 Units Subcutaneous Q8H  . insulin aspart  0-15 Units Subcutaneous TID AC & HS  . insulin glargine  10 Units Subcutaneous Daily  . lidocaine  1 patch Transdermal  Q24H  . mouth rinse  15 mL Mouth Rinse q12n4p  . pantoprazole  40 mg Oral Daily  . QUEtiapine  200 mg Oral BID  . rosuvastatin  20 mg Oral Daily  . umeclidinium-vilanterol  1 puff Inhalation Daily   Continuous Infusions: . sodium chloride Stopped (03/09/20 1213)   PRN Meds:.acetaminophen, albuterol, docusate sodium, glucagon (human recombinant), haloperidol lactate, hydrALAZINE, lidocaine, polyethylene glycol   PHYSICAL EXAM: Vital signs: Vitals:   03/14/20 2133 03/15/20 0508 03/15/20 0523 03/15/20 1127  BP: (!) 150/79 128/85 122/69   Pulse: 85  86 84 93  Resp: 12 14 14 17   Temp: 98.5 F (36.9 C) 98.6 F (37 C) 98.1 F (36.7 C)   TempSrc: Oral Oral Oral   SpO2:  96% 98% 98%  Weight:  94 kg    Height:       Filed Weights   03/13/20 0319 03/14/20 0556 03/15/20 0508  Weight: 93.6 kg 93.6 kg 94 kg   Body mass index is 36.71 kg/m.   Gen Exam:Alert awake-not in any distress HEENT:atraumatic, normocephalic Chest: B/L clear to auscultation anteriorly CVS:S1S2 regular Abdomen:soft non tender, non distended Extremities:no edema Neurology: Non focal Skin: no rash  I have personally reviewed following labs and imaging studies  LABORATORY DATA: CBC: Recent Labs  Lab 03/10/20 1100  WBC 7.1  HGB 12.7  HCT 39.7  MCV 87.8  PLT 500    Basic Metabolic Panel: Recent Labs  Lab 03/10/20 1100 03/12/20 0306  NA 140 139  K 4.3 3.8  CL 105 106  CO2 27 23  GLUCOSE 246* 184*  BUN 14 9  CREATININE 1.22* 1.01*  CALCIUM 8.6* 8.7*    GFR: Estimated Creatinine Clearance: 66.9 mL/min (A) (by C-G formula based on SCr of 1.01 mg/dL (H)).  Liver Function Tests: No results for input(s): AST, ALT, ALKPHOS, BILITOT, PROT, ALBUMIN in the last 168 hours. No results for input(s): LIPASE, AMYLASE in the last 168 hours. No results for input(s): AMMONIA in the last 168 hours.  Coagulation Profile: No results for input(s): INR, PROTIME in the last 168 hours.  Cardiac Enzymes: No results for input(s): CKTOTAL, CKMB, CKMBINDEX, TROPONINI in the last 168 hours.  BNP (last 3 results) No results for input(s): PROBNP in the last 8760 hours.  Lipid Profile: No results for input(s): CHOL, HDL, LDLCALC, TRIG, CHOLHDL, LDLDIRECT in the last 72 hours.  Thyroid Function Tests: No results for input(s): TSH, T4TOTAL, FREET4, T3FREE, THYROIDAB in the last 72 hours.  Anemia Panel: No results for input(s): VITAMINB12, FOLATE, FERRITIN, TIBC, IRON, RETICCTPCT in the last 72 hours.  Urine analysis:    Component Value Date/Time    COLORURINE STRAW (A) 03/08/2020 1306   APPEARANCEUR CLEAR 03/08/2020 1306   LABSPEC 1.022 03/08/2020 1306   PHURINE 5.0 03/08/2020 1306   GLUCOSEU >=500 (A) 03/08/2020 1306   HGBUR NEGATIVE 03/08/2020 1306   BILIRUBINUR NEGATIVE 03/08/2020 1306   KETONESUR NEGATIVE 03/08/2020 1306   PROTEINUR NEGATIVE 03/08/2020 1306   UROBILINOGEN 1.0 06/01/2015 0104   NITRITE NEGATIVE 03/08/2020 1306   LEUKOCYTESUR NEGATIVE 03/08/2020 1306    Sepsis Labs: Lactic Acid, Venous    Component Value Date/Time   LATICACIDVEN 0.9 03/08/2020 1919    MICROBIOLOGY: Recent Results (from the past 240 hour(s))  SARS Coronavirus 2 by RT PCR (hospital order, performed in [place] hospital lab) Nasopharyngeal Nasopharyngeal Swab     Status: None   Collection Time: 03/08/20 11:26 AM   Specimen: Nasopharyngeal Swab  Result Value Ref  Range Status   SARS Coronavirus 2 NEGATIVE NEGATIVE Final    Comment: (NOTE) SARS-CoV-2 target nucleic acids are NOT DETECTED.  The SARS-CoV-2 RNA is generally detectable in upper and lower respiratory specimens during the acute phase of infection. The lowest concentration of SARS-CoV-2 viral copies this assay can detect is 250 copies / mL. A negative result does not preclude SARS-CoV-2 infection and should not be used as the sole basis for treatment or other patient management decisions.  A negative result may occur with improper specimen collection / handling, submission of specimen other than nasopharyngeal swab, presence of viral mutation(s) within the areas targeted by this assay, and inadequate number of viral copies (<250 copies / mL). A negative result must be combined with clinical observations, patient history, and epidemiological information.  Fact Sheet for Patients:   StrictlyIdeas.no  Fact Sheet for Healthcare Providers: BankingDealers.co.za  This test is not yet approved or  cleared by the [place] FDA  and has been authorized for detection and/or diagnosis of SARS-CoV-2 by FDA under an Emergency Use Authorization (EUA).  This EUA will remain in effect (meaning this test can be used) for the duration of the COVID-19 declaration under Section 564(b)(1) of the Act, 21 U.S.C. section 360bbb-3(b)(1), unless the authorization is terminated or revoked sooner.  Performed at [facility], [place] [address] [place], [place] [address]   Urine culture     Status: Abnormal   Collection Time: 03/08/20  1:16 PM   Specimen: Urine, Random  Result Value Ref Range Status   Specimen Description URINE, RANDOM  Final   Special Requests NONE  Final   Culture (A)  Final    <10,000 COLONIES/mL INSIGNIFICANT GROWTH Performed at [facility], [place] [address], [place], [place] [address]    Report Status 03/09/2020 FINAL  Final  Culture, blood (routine x 2)     Status: None   Collection Time: 03/08/20  1:36 PM   Specimen: BLOOD  Result Value Ref Range Status   Specimen Description BLOOD BLOOD LEFT HAND  Final   Special Requests   Final    BOTTLES DRAWN AEROBIC ONLY Blood Culture results may not be optimal due to an inadequate volume of blood received in culture bottles   Culture   Final    NO GROWTH 5 DAYS Performed at [facility], [place] [address], [place], [place] [address]    Report Status 03/13/2020 FINAL  Final  Culture, blood (routine x 2)     Status: None   Collection Time: 03/08/20  7:19 PM   Specimen: BLOOD LEFT FOREARM  Result Value Ref Range Status   Specimen Description BLOOD LEFT FOREARM  Final   Special Requests AEROBIC BOTTLE ONLY Blood Culture adequate volume  Final   Culture   Final    NO GROWTH 5 DAYS Performed at [facility], [place] [address] [place], [place] [address]    Report Status 03/13/2020 FINAL  Final  MRSA PCR Screening     Status: Abnormal   Collection Time: 03/09/20  9:41 AM   Specimen: Nasal Mucosa; Nasopharyngeal  Result Value Ref Range Status    MRSA by PCR POSITIVE (A) NEGATIVE Final    Comment:        The GeneXpert MRSA Assay (FDA approved for NASAL specimens only), is one component of a comprehensive MRSA colonization surveillance program. It is not intended to diagnose MRSA infection nor to guide or monitor treatment for MRSA infections. CRITICAL  RESULT CALLED TO, READ BACK BY AND VERIFIED WITH: RN Maggie Font AT 1121 03/09/20 BY MM Performed at [facility], [place] [address], [place], [place] [address]     RADIOLOGY STUDIES/RESULTS: No results found.   LOS: 7 days   Oren Binet, MD  [facility]    To contact the attending provider between 7A-7P or the covering provider during after hours 7P-7A, please log into the web site [email or web address] and access using universal [place] password for that web site. If you do not have the password, please call the hospital operator.  03/15/2020, 1:25 PM

## 2020-03-15 NOTE — Progress Notes (Signed)
Inpatient Diabetes Program Recommendations  AACE/ADA: New Consensus Statement on Inpatient Glycemic Control (2015)  Target Ranges:  Prepandial:   less than 140 mg/dL      Peak postprandial:   less than 180 mg/dL (1-2 hours)      Critically ill patients:  140 - 180 mg/dL   Lab Results  Component Value Date   GLUCAP 326 (H) 03/15/2020   HGBA1C 11.7 (H) 03/08/2020    Review of Glycemic Control Results for Susan [name], Susan [name] (MRN [id number]) as of 03/15/2020 09:32  Ref. Range 03/14/2020 07:31 03/14/2020 11:55 03/14/2020 17:17 03/14/2020 21:19 03/15/2020 07:51  Glucose-Capillary Latest Ref Range: 70 - 99 mg/dL 164 (H) 220 (H) 315 (H) 245 (H) 326 (H)   Diabetes history:DM Outpatient Diabetes medications:Lantus 30 units qd + Humalog 0-20 tid meal coverage Current orders for Inpatient glycemic control:Lantus 10 units qd +Novolog correction 0-15 units qid  Inpatient Diabetes Program Recommendations:   Please consider: -Increase Lantus to 15 units daily  -Add Novolog 2 units tid meal coverage if eats 50%  Thank you, Bethena Roys E. Uchenna Seufert, RN, MSN, CDE  Diabetes Coordinator Inpatient Glycemic Control Team Team Pager [phone] (8am-5pm) 03/15/2020 9:34 AM

## 2020-03-16 ENCOUNTER — Encounter (HOSPITAL_COMMUNITY): Payer: Self-pay | Admitting: Pulmonary Disease

## 2020-03-16 DIAGNOSIS — F250 Schizoaffective disorder, bipolar type: Secondary | ICD-10-CM

## 2020-03-16 LAB — GLUCOSE, CAPILLARY
Glucose-Capillary: 184 mg/dL — ABNORMAL HIGH (ref 70–99)
Glucose-Capillary: 161 mg/dL — ABNORMAL HIGH (ref 70–99)
Glucose-Capillary: 241 mg/dL — ABNORMAL HIGH (ref 70–99)
Glucose-Capillary: 335 mg/dL — ABNORMAL HIGH (ref 70–99)

## 2020-03-16 NOTE — Progress Notes (Signed)
Nutrition Follow-up  DOCUMENTATION CODES:   Obesity unspecified  INTERVENTION:  Continue regular diet with thin liquids.   Encourage adequate PO intake.   NUTRITION DIAGNOSIS:   Increased nutrient needs related to acute illness as evidenced by estimated needs; ongoing  GOAL:   Patient will meet greater than or equal to 90% of their needs; met  MONITOR:   PO intake, Supplement acceptance, Labs, Weight trends  REASON FOR ASSESSMENT:   Consult Assessment of nutrition requirement/status  ASSESSMENT:   58 yo female acute encephalopathy secondary to right PCA territory stroke, potentially acute on chronic changes, potential overdose. PMH includes HTN, HIV, DM, seizure disorder, bipolar disorder, IVDA, recent stroke with dysphagia and PEG tube which was removed several weeks ago. MRI of the brain done this admission was positive for acute/subacute infarct involving the right PCA/right MCA watershed area.  Pt is currently on a regular diet with thin liquids. Meal completion has been 100%. Recommend continuation of current diet. Pt encouraged to eat her food at meals. Labs and medications reviewed. SNF placement pending.   Diet Order:   Diet Order            Diet regular Room service appropriate? Yes with Assist; Fluid consistency: Thin  Diet effective now                 EDUCATION NEEDS:   Not appropriate for education at this time  Skin:  Skin Assessment: Reviewed RN Assessment  Last BM:  7/13  Height:   Ht Readings from Last 1 Encounters:  03/10/20 5' 3" (1.6 m)    Weight:   Wt Readings from Last 1 Encounters:  03/16/20 92.2 kg    BMI:  Body mass index is 36.01 kg/m.  Estimated Nutritional Needs:   Kcal:  1700-1900  Protein:  90-105 grams  Fluid:  >/= 1.7 L/day   , MS, RD, LDN RD pager number/after hours weekend pager number on Amion.  

## 2020-03-16 NOTE — Progress Notes (Signed)
Progress Note    Susan [name]  CZY:[id number] DOB: [birth year]  DOA: 03/08/2020 PCP: Hoyt Koch, MD      Brief Narrative:    Medical records reviewed and are as summarized below:  Susan [name] is a 59 y.o. female with medical history of HTN, HIV, DM, seizure disorder, bipolar disorder, prolonged hospitalization from 3/10-5/8 for respiratory failure/encephalopathy/septic shock/CVA-required intubation-difficult to wean off ventilator-tracheostomy/PEG tube placement-she was eventually decannulated on 4/28-discharged to SNF-presented to the hospital on 7/6 for altered mental status, hypoglycemia/hypotension-thought to be multifactorial-from unintentional narcotic overdose-she was transferred to the ICU-required Narcan infusion-upon stability-she was transferred to [facility] service.  MRI of the brain done this admission was positive for acute/subacute infarct involving the right PCA/right MCA watershed area.   Significant events: 7/6>> presented to [facility] with confusion/hypoglycemia/hypotension found to have AKI-requiring several doses of Narcan-eventually Narcan drip for presumed unintentional narcotic overdose. 7/8>> transfer to [facility] from ICU.  Significant studies: 3/16>> TEE: EF 55-60% 3/23>> daughter Doppler: No significant stenosis. 3/23>> lower extremity Doppler: No DVT. 7/6>> chest x-ray: Low volume chest with indistinct opacity-that could be scarring/active infection 7/6>> CT head: Age-indeterminate right parasagittal occipital infarct with bilateral extension. 7/6>> CT C-spine: No acute abnormality noted in the C-spine 7/6>> renal ultrasound: Negative for obstructive uropathy 7/7>> MRI brain: Acute to subacute infarct involving the right PCA and right MCA/PCA watershed area 7/7>> MRI brain: Stable intracranial MRA since March-with no large vessel occlusion. 7/7>> EEG: No seizures.  Antimicrobial therapy: Unasyn: 7/6>>7/11  Microbiology data: 7/6>> urine  culture:<10,000 colonies/mL insignificant growth 7/6>> blood culture: No growth  Procedures : 7/8>> right jugular central line by IR (pulled out by patient later on 7/8 evening)  Consults: PCCM Neurology   Assessment/Plan:   Principal Problem:   Acute encephalopathy Active Problems:   HIV disease ([place])   Schizoaffective disorder, bipolar type ([place])   Morbid obesity ([place])   GERD (gastroesophageal reflux disease)   Diabetes mellitus type 2 without retinopathy ([place])   Hypoglycemia   MDD (major depressive disorder), recurrent episode, moderate (H[facility])   Hypotension   AKI (acute kidney injury) ([place])   Schizophrenia ([place])   Acute toxic metabolic encephalopathy: Multifactorial-likely narcotic overdose, hypoglycemia, AKI and sepsis physiology all contributing.  Mental status improved after treatment of underlying conditions  Suspected narcotic overdose: Although urine drug screen negative-she responded well (per PCCM note) to Narcan infusion.  Apparently, patient was started on Nucynta 1 day prior to this hospital stay-(not listed on home med list)-urine drug screen negative as well.  Avoid narcotics as much as possible.  Hypoglycemia: Suspect likely secondary to poor oral intake in the setting of confusion-resolved-CBGs stable with SSI.  Sepsis acute hypoxic respiratory failure likely secondary to aspiration pneumonia: Sepsis physiology has resolved-blood cultures negative-Unasyn completed as off 7/11.  Has been titrated off oxygen and stable on room air.  AKI: Likely hemodynamically mediated-creatinine has significantly improved with supportive care.  Renal ultrasound was negative for hydronephrosis.  Follow renal function periodically.  Acute CVA: Apparently had transient left-sided weakness that has resolved. LDL 52, A1c 11.7-had stroke work-up during her most recent admission-neurology recommending aspirin.  HIV: Patient CD4 count on 6/2 882-continue  antiretrovirals.  HTN: Continue antihypertensives  DM-2 (A1c 11.7 on 7/6): Had hypoglycemia on admission.-continue Lantus and NovoLog. Would avoid tight glycemic control-and allow some amount of permissive hyperglycemia in this patient with multiple medical issues.      Nutrition Problem: Increased nutrient needs Etiology: acute illness  Signs/Symptoms: estimated needs  Body mass index is 36.01 kg/m.  Diet Order            Diet regular Room service appropriate? Yes with Assist; Fluid consistency: Thin  Diet effective now                       Medications:   . amLODipine  5 mg Oral Daily  . aspirin  81 mg Oral Daily  . bictegravir-emtricitabine-tenofovir AF  1 tablet Oral Daily  . carvedilol  3.125 mg Oral BID  . chlorhexidine  15 mL Mouth Rinse BID  . Chlorhexidine Gluconate Cloth  6 each Topical Daily  . doxepin  50 mg Oral QHS  . gabapentin  100 mg Oral BID  . heparin  5,000 Units Subcutaneous Q8H  . insulin aspart  0-15 Units Subcutaneous TID AC & HS  . insulin aspart  4 Units Subcutaneous TID WC  . insulin glargine  15 Units Subcutaneous Daily  . lidocaine  1 patch Transdermal Q24H  . mouth rinse  15 mL Mouth Rinse q12n4p  . pantoprazole  40 mg Oral Daily  . QUEtiapine  200 mg Oral BID  . rosuvastatin  20 mg Oral Daily  . umeclidinium-vilanterol  1 puff Inhalation Daily   Continuous Infusions: . sodium chloride Stopped (03/09/20 1213)     Anti-infectives (From admission, onward)   Start     Dose/Rate Route Frequency Ordered Stop   03/10/20 1400  Ampicillin-Sulbactam (UNASYN) 3 g in sodium chloride 0.9 % 100 mL IVPB  Status:  Discontinued        3 g 200 mL/hr over 30 Minutes Intravenous Every 8 hours 03/10/20 1324 03/14/20 0559   03/09/20 0600  ampicillin-sulbactam (UNASYN) 1.5 g in sodium chloride 0.9 % 100 mL IVPB  Status:  Discontinued        1.5 g 200 mL/hr over 30 Minutes Intravenous Every 12 hours 03/08/20 1716 03/10/20 1324   03/08/20  1930  bictegravir-emtricitabine-tenofovir AF (BIKTARVY) 50-200-25 MG per tablet 1 tablet     Discontinue     1 tablet Oral Daily 03/08/20 1453     03/08/20 1515  Ampicillin-Sulbactam (UNASYN) 3 g in sodium chloride 0.9 % 100 mL IVPB        3 g 200 mL/hr over 30 Minutes Intravenous  Once 03/08/20 1500 03/08/20 2126             Family Communication/Anticipated D/C date and plan/Code Status   DVT prophylaxis: SCDs Start: 03/08/20 2005 heparin injection 5,000 Units Start: 03/08/20 1500     Code Status: Full Code  Family Communication: Plan discussed with patient Disposition Plan:    Status is: Inpatient  Remains inpatient appropriate because:Unsafe d/c plan   Dispo:  Patient From: Home  Planned Disposition: [place]  Expected discharge date: Tomorrow  Medically stable for discharge: Yes            Subjective:   No acute events overnight.  She has no complaints.  Objective:    Vitals:   03/16/20 0513 03/16/20 0858 03/16/20 1147 03/16/20 1525  BP: (!) 107/54   111/76  Pulse: 89   69  Resp: 13   11  Temp: 98 F (36.7 C)  98.2 F (36.8 C) 98 F (36.7 C)  TempSrc: Oral  Oral Oral  SpO2: 97% 98%  95%  Weight: 92.2 kg     Height:       No data found.   Intake/Output Summary (Last 24 hours)  at 03/16/2020 1605 Last data filed at 03/16/2020 1407 Gross per 24 hour  Intake 570 ml  Output 850 ml  Net -280 ml   Filed Weights   03/14/20 0556 03/15/20 0508 03/16/20 0513  Weight: 93.6 kg 94 kg 92.2 kg    Exam:  GEN: NAD SKIN: No rash EYES: EOMI ENT: MMM CV: RRR PULM: CTA B ABD: soft, obese, NT, +BS CNS: AAO x 3, non focal EXT: No edema or tenderness   Data Reviewed:   I have personally reviewed following labs and imaging studies:  Labs: Labs show the following:   Basic Metabolic Panel: Recent Labs  Lab 03/10/20 1100 03/12/20 0306  NA 140 139  K 4.3 3.8  CL 105 106  CO2 27 23  GLUCOSE 246* 184*  BUN 14 9   CREATININE 1.22* 1.01*  CALCIUM 8.6* 8.7*   GFR Estimated Creatinine Clearance: 66.3 mL/min (A) (by C-G formula based on SCr of 1.01 mg/dL (H)). Liver Function Tests: No results for input(s): AST, ALT, ALKPHOS, BILITOT, PROT, ALBUMIN in the last 168 hours. No results for input(s): LIPASE, AMYLASE in the last 168 hours. No results for input(s): AMMONIA in the last 168 hours. Coagulation profile No results for input(s): INR, PROTIME in the last 168 hours.  CBC: Recent Labs  Lab 03/10/20 1100  WBC 7.1  HGB 12.7  HCT 39.7  MCV 87.8  PLT 289   Cardiac Enzymes: No results for input(s): CKTOTAL, CKMB, CKMBINDEX, TROPONINI in the last 168 hours. BNP (last 3 results) No results for input(s): PROBNP in the last 8760 hours. CBG: Recent Labs  Lab 03/15/20 1157 03/15/20 1618 03/15/20 2113 03/16/20 0731 03/16/20 1146  GLUCAP 164* 180* 187* 184* 161*   D-Dimer: No results for input(s): DDIMER in the last 72 hours. Hgb A1c: No results for input(s): HGBA1C in the last 72 hours. Lipid Profile: No results for input(s): CHOL, HDL, LDLCALC, TRIG, CHOLHDL, LDLDIRECT in the last 72 hours. Thyroid function studies: No results for input(s): TSH, T4TOTAL, T3FREE, THYROIDAB in the last 72 hours.  Invalid input(s): FREET3 Anemia work up: No results for input(s): VITAMINB12, FOLATE, FERRITIN, TIBC, IRON, RETICCTPCT in the last 72 hours. Sepsis Labs: Recent Labs  Lab 03/10/20 1100 03/11/20 0729  PROCALCITON 0.65 0.23  WBC 7.1  --     Microbiology Recent Results (from the past 240 hour(s))  SARS Coronavirus 2 by RT PCR (hospital order, performed in [facility] hospital lab) Nasopharyngeal Nasopharyngeal Swab     Status: None   Collection Time: 03/08/20 11:26 AM   Specimen: Nasopharyngeal Swab  Result Value Ref Range Status   SARS Coronavirus 2 NEGATIVE NEGATIVE Final    Comment: (NOTE) SARS-CoV-2 target nucleic acids are NOT DETECTED.  The SARS-CoV-2 RNA is generally detectable  in upper and lower respiratory specimens during the acute phase of infection. The lowest concentration of SARS-CoV-2 viral copies this assay can detect is 250 copies / mL. A negative result does not preclude SARS-CoV-2 infection and should not be used as the sole basis for treatment or other patient management decisions.  A negative result may occur with improper specimen collection / handling, submission of specimen other than nasopharyngeal swab, presence of viral mutation(s) within the areas targeted by this assay, and inadequate number of viral copies (<250 copies / mL). A negative result must be combined with clinical observations, patient history, and epidemiological information.  Fact Sheet for Patients:   StrictlyIdeas.no  Fact Sheet for Healthcare Providers: BankingDealers.co.za  This test is  not yet approved or  cleared by the [place] and has been authorized for detection and/or diagnosis of SARS-CoV-2 by FDA under an Emergency Use Authorization (EUA).  This EUA will remain in effect (meaning this test can be used) for the duration of the COVID-19 declaration under Section 564(b)(1) of the Act, 21 U.S.C. section 360bbb-3(b)(1), unless the authorization is terminated or revoked sooner.  Performed at [facility], [place] [address], [place], [place] [address]   Urine culture     Status: Abnormal   Collection Time: 03/08/20  1:16 PM   Specimen: Urine, Random  Result Value Ref Range Status   Specimen Description URINE, RANDOM  Final   Special Requests NONE  Final   Culture (A)  Final    <10,000 COLONIES/mL INSIGNIFICANT GROWTH Performed at [facility], [place] [address] [place], [place] [address]    Report Status 03/09/2020 FINAL  Final  Culture, blood (routine x 2)     Status: None   Collection Time: 03/08/20  1:36 PM   Specimen: BLOOD  Result Value Ref Range Status   Specimen Description BLOOD BLOOD  LEFT HAND  Final   Special Requests   Final    BOTTLES DRAWN AEROBIC ONLY Blood Culture results may not be optimal due to an inadequate volume of blood received in culture bottles   Culture   Final    NO GROWTH 5 DAYS Performed at [facility], [place] [address], [place], [place] [address]    Report Status 03/13/2020 FINAL  Final  Culture, blood (routine x 2)     Status: None   Collection Time: 03/08/20  7:19 PM   Specimen: BLOOD LEFT FOREARM  Result Value Ref Range Status   Specimen Description BLOOD LEFT FOREARM  Final   Special Requests AEROBIC BOTTLE ONLY Blood Culture adequate volume  Final   Culture   Final    NO GROWTH 5 DAYS Performed at [facility], [place] [address], [place], [place] [address]    Report Status 03/13/2020 FINAL  Final  MRSA PCR Screening     Status: Abnormal   Collection Time: 03/09/20  9:41 AM   Specimen: Nasal Mucosa; Nasopharyngeal  Result Value Ref Range Status   MRSA by PCR POSITIVE (A) NEGATIVE Final    Comment:        The GeneXpert MRSA Assay (FDA approved for NASAL specimens only), is one component of a comprehensive MRSA colonization surveillance program. It is not intended to diagnose MRSA infection nor to guide or monitor treatment for MRSA infections. CRITICAL RESULT CALLED TO, READ BACK BY AND VERIFIED WITH: RN Maggie Font AT 1121 03/09/20 BY MM Performed at [facility], [place] [address] [place],  [address]     Procedures and diagnostic studies:  No results found.             LOS: 8 days   Valentino Saavedra  [facility]     03/16/2020, 4:05 PM

## 2020-03-17 LAB — GLUCOSE, CAPILLARY
Glucose-Capillary: 221 mg/dL — ABNORMAL HIGH (ref 70–99)
Glucose-Capillary: 228 mg/dL — ABNORMAL HIGH (ref 70–99)
Glucose-Capillary: 191 mg/dL — ABNORMAL HIGH (ref 70–99)
Glucose-Capillary: 234 mg/dL — ABNORMAL HIGH (ref 70–99)

## 2020-03-17 MED ORDER — INSULIN GLARGINE 100 UNIT/ML ~~LOC~~ SOLN
18.0000 [IU] | Freq: Every day | SUBCUTANEOUS | Status: DC
  Administered 2020-03-18 – 2020-03-21 (×4): 18 [IU] via SUBCUTANEOUS
  Filled 2020-03-17 (×4): qty 0.18

## 2020-03-17 NOTE — Progress Notes (Addendum)
Progress Note    Susan [name]  BOF:[id number] DOB: 08-03-1961  DOA: 03/08/2020 PCP: Hoyt Koch, MD      Brief Narrative:    Medical records reviewed and are as summarized below:  Susan [name] is a 59 y.o. female with medical history of HTN, HIV, DM, seizure disorder, bipolar disorder, prolonged hospitalization from 3/10-5/8 for respiratory failure/encephalopathy/septic shock/CVA-required intubation-difficult to wean off ventilator-tracheostomy/PEG tube placement-she was eventually decannulated on 4/28-discharged to SNF-presented to the hospital on 7/6 for altered mental status, hypoglycemia/hypotension-thought to be multifactorial-from unintentional narcotic overdose-she was transferred to the ICU-required Narcan infusion-upon stability-she was transferred to [facility] service.  MRI of the brain done this admission was positive for acute/subacute infarct involving the right PCA/right MCA watershed area.   Significant events: 7/6>> presented to [facility] with confusion/hypoglycemia/hypotension found to have AKI-requiring several doses of Narcan-eventually Narcan drip for presumed unintentional narcotic overdose. 7/8>> transfer to [facility] from ICU.  Significant studies: 3/16>> TEE: EF 55-60% 3/23>> daughter Doppler: No significant stenosis. 3/23>> lower extremity Doppler: No DVT. 7/6>> chest x-ray: Low volume chest with indistinct opacity-that could be scarring/active infection 7/6>> CT head: Age-indeterminate right parasagittal occipital infarct with bilateral extension. 7/6>> CT C-spine: No acute abnormality noted in the C-spine 7/6>> renal ultrasound: Negative for obstructive uropathy 7/7>> MRI brain: Acute to subacute infarct involving the right PCA and right MCA/PCA watershed area 7/7>> MRI brain: Stable intracranial MRA since March-with no large vessel occlusion. 7/7>> EEG: No seizures.  Antimicrobial therapy: Unasyn: 7/6>>7/11  Microbiology data: 7/6>> urine  culture:<10,000 colonies/mL insignificant growth 7/6>> blood culture: No growth  Procedures : 7/8>> right jugular central line by IR (pulled out by patient later on 7/8 evening)  Consults: PCCM Neurology   Assessment/Plan:   Principal Problem:   Acute encephalopathy Active Problems:   HIV disease ([place])   Schizoaffective disorder, bipolar type ([place])   Morbid obesity ([place])   GERD (gastroesophageal reflux disease)   Diabetes mellitus type 2 without retinopathy ([place])   Hypoglycemia   MDD (major depressive disorder), recurrent episode, moderate (HCC)   Hypotension   AKI (acute kidney injury) ([name])   Schizophrenia ([place])   Acute toxic metabolic encephalopathy: Multifactorial-likely narcotic overdose, hypoglycemia, AKI and sepsis physiology all contributing.  Mental status improved after treatment of underlying conditions  Suspected narcotic overdose: Although urine drug screen negative-she responded well (per PCCM note) to Narcan infusion.  Apparently, patient was started on Nucynta 1 day prior to this hospital stay-(not listed on home med list)-urine drug screen negative as well.  Avoid narcotics as much as possible.  Hypoglycemia: Suspect likely secondary to poor oral intake in the setting of confusion-resolved-CBGs stable with SSI.  Sepsis acute hypoxic respiratory failure likely secondary to aspiration pneumonia: Sepsis physiology has resolved-blood cultures negative-Unasyn completed as off 7/11.  Has been titrated off oxygen and stable on room air.  AKI: Likely hemodynamically mediated-creatinine has significantly improved with supportive care.  Renal ultrasound was negative for hydronephrosis.  Follow renal function periodically.  Acute CVA: Apparently had transient left-sided weakness that has resolved. LDL 52, A1c 11.7-had stroke work-up during her most recent admission-neurology recommending aspirin.  HIV: Patient CD4 count on 6/2 882-continue  antiretrovirals.  HTN: Continue antihypertensives  DM-2 with hyperglycemia (A1c 11.7 on 7/6): Had hypoglycemia on admission.  Increase Lantus from 15 to 18 units daily.  Continue NovoLog as needed.    Nutrition Problem: Increased nutrient needs Etiology: acute illness  Signs/Symptoms: estimated needs   Body mass index is 35.93  kg/m.  Diet Order            Diet regular Room service appropriate? Yes with Assist; Fluid consistency: Thin  Diet effective now                       Medications:   . amLODipine  5 mg Oral Daily  . aspirin  81 mg Oral Daily  . bictegravir-emtricitabine-tenofovir AF  1 tablet Oral Daily  . carvedilol  3.125 mg Oral BID  . chlorhexidine  15 mL Mouth Rinse BID  . Chlorhexidine Gluconate Cloth  6 each Topical Daily  . doxepin  50 mg Oral QHS  . gabapentin  100 mg Oral BID  . heparin  5,000 Units Subcutaneous Q8H  . insulin aspart  0-15 Units Subcutaneous TID AC & HS  . insulin aspart  4 Units Subcutaneous TID WC  . insulin glargine  15 Units Subcutaneous Daily  . lidocaine  1 patch Transdermal Q24H  . mouth rinse  15 mL Mouth Rinse q12n4p  . pantoprazole  40 mg Oral Daily  . QUEtiapine  200 mg Oral BID  . rosuvastatin  20 mg Oral Daily  . umeclidinium-vilanterol  1 puff Inhalation Daily   Continuous Infusions: . sodium chloride Stopped (03/09/20 1213)     Anti-infectives (From admission, onward)   Start     Dose/Rate Route Frequency Ordered Stop   03/10/20 1400  Ampicillin-Sulbactam (UNASYN) 3 g in sodium chloride 0.9 % 100 mL IVPB  Status:  Discontinued        3 g 200 mL/hr over 30 Minutes Intravenous Every 8 hours 03/10/20 1324 03/14/20 0559   03/09/20 0600  ampicillin-sulbactam (UNASYN) 1.5 g in sodium chloride 0.9 % 100 mL IVPB  Status:  Discontinued        1.5 g 200 mL/hr over 30 Minutes Intravenous Every 12 hours 03/08/20 1716 03/10/20 1324   03/08/20 1930  bictegravir-emtricitabine-tenofovir AF (BIKTARVY) 50-200-25 MG per  tablet 1 tablet     Discontinue     1 tablet Oral Daily 03/08/20 1453     03/08/20 1515  Ampicillin-Sulbactam (UNASYN) 3 g in sodium chloride 0.9 % 100 mL IVPB        3 g 200 mL/hr over 30 Minutes Intravenous  Once 03/08/20 1500 03/08/20 2126             Family Communication/Anticipated D/C date and plan/Code Status   DVT prophylaxis: SCDs Start: 03/08/20 2005 heparin injection 5,000 Units Start: 03/08/20 1500     Code Status: Full Code  Family Communication: Plan discussed with her daughter, Aquinna. Disposition Plan:    Status is: Inpatient  Remains inpatient appropriate because:Unsafe d/c plan   Dispo:  Patient From: Home  Planned Disposition: [place]  Expected discharge date: Tomorrow  Medically stable for discharge: Yes            Subjective:   No acute issues.  No shortness of breath or chest pain.  She feels okay.  Objective:    Vitals:   03/16/20 1525 03/16/20 2029 03/17/20 0435 03/17/20 0822  BP: 111/76 136/86 (!) 153/65 119/72  Pulse: 69 85 86 90  Resp: 11 13 15    Temp: 98 F (36.7 C) 98.7 F (37.1 C) 98.4 F (36.9 C)   TempSrc: Oral Oral Axillary   SpO2: 95% 99% 100%   Weight:   92 kg   Height:       No data found.   Intake/Output Summary (  Last 24 hours) at 03/17/2020 1610 Last data filed at 03/17/2020 0120 Gross per 24 hour  Intake --  Output 1400 ml  Net -1400 ml   Filed Weights   03/15/20 0508 03/16/20 0513 03/17/20 0435  Weight: 94 kg 92.2 kg 92 kg    Exam:  GEN: NAD SKIN: Warm and dry EYES: No pallor or icterus ENT: MMM CV: RRR PULM: No wheezing or rales heard ABD: soft, obese, NT, +BS CNS: AAO x 3, non focal EXT: No edema or tenderness   Data Reviewed:   I have personally reviewed following labs and imaging studies:  Labs: Labs show the following:   Basic Metabolic Panel: Recent Labs  Lab 03/12/20 0306  NA 139  K 3.8  CL 106  CO2 23  GLUCOSE 184*  BUN 9  CREATININE 1.01*   CALCIUM 8.7*   GFR Estimated Creatinine Clearance: 66.2 mL/min (A) (by C-G formula based on SCr of 1.01 mg/dL (H)). Liver Function Tests: No results for input(s): AST, ALT, ALKPHOS, BILITOT, PROT, ALBUMIN in the last 168 hours. No results for input(s): LIPASE, AMYLASE in the last 168 hours. No results for input(s): AMMONIA in the last 168 hours. Coagulation profile No results for input(s): INR, PROTIME in the last 168 hours.  CBC: No results for input(s): WBC, NEUTROABS, HGB, HCT, MCV, PLT in the last 168 hours. Cardiac Enzymes: No results for input(s): CKTOTAL, CKMB, CKMBINDEX, TROPONINI in the last 168 hours. BNP (last 3 results) No results for input(s): PROBNP in the last 8760 hours. CBG: Recent Labs  Lab 03/16/20 1146 03/16/20 1613 03/16/20 2056 03/17/20 0756 03/17/20 1249  GLUCAP 161* 241* 335* 221* 228*   D-Dimer: No results for input(s): DDIMER in the last 72 hours. Hgb A1c: No results for input(s): HGBA1C in the last 72 hours. Lipid Profile: No results for input(s): CHOL, HDL, LDLCALC, TRIG, CHOLHDL, LDLDIRECT in the last 72 hours. Thyroid function studies: No results for input(s): TSH, T4TOTAL, T3FREE, THYROIDAB in the last 72 hours.  Invalid input(s): FREET3 Anemia work up: No results for input(s): VITAMINB12, FOLATE, FERRITIN, TIBC, IRON, RETICCTPCT in the last 72 hours. Sepsis Labs: Recent Labs  Lab 03/11/20 0729  PROCALCITON 0.23    Microbiology Recent Results (from the past 240 hour(s))  SARS Coronavirus 2 by RT PCR (hospital order, performed in [facility] hospital lab) Nasopharyngeal Nasopharyngeal Swab     Status: None   Collection Time: 03/08/20 11:26 AM   Specimen: Nasopharyngeal Swab  Result Value Ref Range Status   SARS Coronavirus 2 NEGATIVE NEGATIVE Final    Comment: (NOTE) SARS-CoV-2 target nucleic acids are NOT DETECTED.  The SARS-CoV-2 RNA is generally detectable in upper and lower respiratory specimens during the acute phase of  infection. The lowest concentration of SARS-CoV-2 viral copies this assay can detect is 250 copies / mL. A negative result does not preclude SARS-CoV-2 infection and should not be used as the sole basis for treatment or other patient management decisions.  A negative result may occur with improper specimen collection / handling, submission of specimen other than nasopharyngeal swab, presence of viral mutation(s) within the areas targeted by this assay, and inadequate number of viral copies (<250 copies / mL). A negative result must be combined with clinical observations, patient history, and epidemiological information.  Fact Sheet for Patients:   StrictlyIdeas.no  Fact Sheet for Healthcare Providers: BankingDealers.co.za  This test is not yet approved or  cleared by the [place] FDA and has been authorized for detection and/or  diagnosis of SARS-CoV-2 by FDA under an Emergency Use Authorization (EUA).  This EUA will remain in effect (meaning this test can be used) for the duration of the COVID-19 declaration under Section 564(b)(1) of the Act, 21 U.S.C. section 360bbb-3(b)(1), unless the authorization is terminated or revoked sooner.  Performed at [facility], [place] [address] [place], [place] [address]   Urine culture     Status: Abnormal   Collection Time: 03/08/20  1:16 PM   Specimen: Urine, Random  Result Value Ref Range Status   Specimen Description URINE, RANDOM  Final   Special Requests NONE  Final   Culture (A)  Final    <10,000 COLONIES/mL INSIGNIFICANT GROWTH Performed at [facility], [place] [address] [place], [place] [address]    Report Status 03/09/2020 FINAL  Final  Culture, blood (routine x 2)     Status: None   Collection Time: 03/08/20  1:36 PM   Specimen: BLOOD  Result Value Ref Range Status   Specimen Description BLOOD BLOOD LEFT HAND  Final   Special Requests   Final    BOTTLES DRAWN AEROBIC  ONLY Blood Culture results may not be optimal due to an inadequate volume of blood received in culture bottles   Culture   Final    NO GROWTH 5 DAYS Performed at [facility], [place] [address] [place], [place] [address]    Report Status 03/13/2020 FINAL  Final  Culture, blood (routine x 2)     Status: None   Collection Time: 03/08/20  7:19 PM   Specimen: BLOOD LEFT FOREARM  Result Value Ref Range Status   Specimen Description BLOOD LEFT FOREARM  Final   Special Requests AEROBIC BOTTLE ONLY Blood Culture adequate volume  Final   Culture   Final    NO GROWTH 5 DAYS Performed at [facility], [place] [address], [place], [place] [address]    Report Status 03/13/2020 FINAL  Final  MRSA PCR Screening     Status: Abnormal   Collection Time: 03/09/20  9:41 AM   Specimen: Nasal Mucosa; Nasopharyngeal  Result Value Ref Range Status   MRSA by PCR POSITIVE (A) NEGATIVE Final    Comment:        The GeneXpert MRSA Assay (FDA approved for NASAL specimens only), is one component of a comprehensive MRSA colonization surveillance program. It is not intended to diagnose MRSA infection nor to guide or monitor treatment for MRSA infections. CRITICAL RESULT CALLED TO, READ BACK BY AND VERIFIED WITH: RN Maggie Font AT 1121 03/09/20 BY MM Performed at [facility], [place] [address] [place], [place] [address]     Procedures and diagnostic studies:  No results found.             LOS: 9 days   Halaina Vanduzer  [facility]     03/17/2020, 4:10 PM

## 2020-03-17 NOTE — Progress Notes (Signed)
Inpatient Diabetes Program Recommendations  AACE/ADA: New Consensus Statement on Inpatient Glycemic Control (2015)  Target Ranges:  Prepandial:   less than 140 mg/dL      Peak postprandial:   less than 180 mg/dL (1-2 hours)      Critically ill patients:  140 - 180 mg/dL   Lab Results  Component Value Date   GLUCAP 221 (H) 03/17/2020   HGBA1C 11.7 (H) 03/08/2020    Review of Glycemic Control Results for Susan [name], Susan [name] (MRN [id number]) as of 03/17/2020 11:25  Ref. Range 03/16/2020 07:31 03/16/2020 11:46 03/16/2020 16:13 03/16/2020 20:56 03/17/2020 07:56  Glucose-Capillary Latest Ref Range: 70 - 99 mg/dL 184 (H) 161 (H) 241 (H) 335 (H) 221 (H)   Inpatient Diabetes Program Recommendations:   Consider increase in Lantus 20 units qd  Thank you, Nani Gasser. Jebadiah Imperato, RN, MSN, CDE  Diabetes Coordinator Inpatient Glycemic Control Team Team Pager [phone] (8am-5pm) 03/17/2020 11:26 AM

## 2020-03-17 NOTE — Progress Notes (Signed)
Physical Therapy Treatment Patient Details Name: Susan [name] MRN: [id number] DOB: 06/21/1961 Today's Date: 03/17/2020    History of Present Illness Amilliana Hayworth is a 59 year old female admitted on 03/08/20 with a PMH significant for tobacco abuse, HTN, HIV, DM, diabetic neuropathy, seizure disorder, bipolar disease, schizophrenia, and IVDA. She was recently hospitalized from March to May 2021 with encephalopathy secondary to overdose which was complicated by MRSA pneumonia, pneumococcal bacteremia, septic shock, and respiratory failure requiring tracheostomy and G-tube placement. She was decannulated on April 28th, 2021. During her prior hospitalization she was noted to have acute vs subacute bilateral MCA infarcts. During this admission, she was found by EMS to be lethargic, confused, hypotensive, and hypoglycemic with pinpoint pupils. She was noted to have left-sided neglect and left hemiparesis. CT Head showed interval right PCA stroke, potentially acute on chronic changes.    PT Comments    Pt lethargic but once awake expressed desire to get OOB and clean up at sink. Min HHA to ambulate to sink and clean self in sitting and standing with UE support. Safety deficits apparent with ambulation in hallway. Pt requested to use RW due to BLE soreness. Pt with decreased attention to L visual field with running into dinamap on L side and not correcting but continuing to push it with RW until verbal and tactile cues given by therapist. Continues to recommend SNF at d/c due to decreased safety with mobility. PT will continue to follow.    Follow Up Recommendations  SNF     Equipment Recommendations  None recommended by PT    Recommendations for Other Services       Precautions / Restrictions Precautions Precautions: Fall Precaution Comments: decreased safety awareness with mobility, decreased abilty to follow directional cues Restrictions Weight Bearing Restrictions: No    Mobility  Bed  Mobility Overal bed mobility: Modified Independent Bed Mobility: Supine to Sit;Sit to Supine     Supine to sit: Modified independent (Device/Increase time) Sit to supine: Modified independent (Device/Increase time)   General bed mobility comments: moves very impulsively but demos safe grading of motion to EOB. Able to lift LE's for return to supine with use of momentum  Transfers Overall transfer level: Needs assistance Equipment used: 1 person hand held assist;Rolling walker (2 wheeled) Transfers: Sit to/from Stand Sit to Stand: Min guard;Min assist         General transfer comment: min-guard from bed and chair multiple times, 2 mild instabilities with min A to correct  Ambulation/Gait Ambulation/Gait assistance: Min assist Gait Distance (Feet): 50 Feet Assistive device: Rolling walker (2 wheeled) Gait Pattern/deviations: Step-to pattern;Decreased stride length;Shuffle;Wide base of support Gait velocity: decrease Gait velocity interpretation: <1.8 ft/sec, indicate of risk for recurrent falls General Gait Details: pt requested to use RW due to LE soreness. Difficulty with obstacle navigation in hallway, ran into dinamap and just kept pushing it instead of moving arouns it, until tactile cues given by therapist. Fatigue noted after 32' with 2/4 DOE.    Stairs             Wheelchair Mobility    Modified Rankin (Stroke Patients Only) Modified Rankin (Stroke Patients Only) Pre-Morbid Rankin Score: Moderate disability Modified Rankin: Moderately severe disability     Balance Overall balance assessment: Needs assistance Sitting-balance support: Feet supported;No upper extremity supported Sitting balance-Leahy Scale: Good Sitting balance - Comments: pt able to reach down to shins to clean BLE's but unable to reach all the way to feet safely, increased effort to return  to full sitting   Standing balance support: Single extremity supported Standing balance-Leahy Scale:  Poor Standing balance comment: needs at least one hand on external support for standing balance.                             Cognition Arousal/Alertness: Lethargic;Suspect due to medications Behavior During Therapy: Impulsive Overall Cognitive Status: History of cognitive impairments - at baseline Area of Impairment: Safety/judgement;Awareness;Memory;Following commands                 Orientation Level: Disoriented to;Place;Time;Situation   Memory: Decreased short-term memory Following Commands: Follows one step commands inconsistently Safety/Judgement: Decreased awareness of safety;Decreased awareness of deficits Awareness: Emergent Problem Solving: Slow processing;Requires verbal cues;Requires tactile cues;Difficulty sequencing General Comments: pt given multiple directional cues during ambulation with visual cues as well and did not follow due to decreased comprehension      Exercises      General Comments General comments (skin integrity, edema, etc.): VSS      Pertinent Vitals/Pain Pain Assessment: Faces Faces Pain Scale: Hurts even more Pain Location: BLE's Pain Descriptors / Indicators: Aching;Sore Pain Intervention(s): Limited activity within patient's tolerance;Monitored during session    Home Living                      Prior Function            PT Goals (current goals can now be found in the care plan section) Acute Rehab PT Goals Patient Stated Goal: wants to get washed up PT Goal Formulation: With patient/family Time For Goal Achievement: 03/23/20 Potential to Achieve Goals: Fair Progress towards PT goals: Progressing toward goals    Frequency    Min 3X/week      PT Plan Current plan remains appropriate    Co-evaluation              AM-PAC PT "6 Clicks" Mobility   Outcome Measure  Help needed turning from your back to your side while in a flat bed without using bedrails?: None Help needed moving from lying  on your back to sitting on the side of a flat bed without using bedrails?: None Help needed moving to and from a bed to a chair (including a wheelchair)?: A Little Help needed standing up from a chair using your arms (e.g., wheelchair or bedside chair)?: A Little Help needed to walk in hospital room?: A Lot Help needed climbing 3-5 steps with a railing? : A Lot 6 Click Score: 18    End of Session Equipment Utilized During Treatment: Gait belt Activity Tolerance: Patient tolerated treatment well Patient left: in bed;with call bell/phone within reach;with bed alarm set Nurse Communication: Mobility status PT Visit Diagnosis: Other abnormalities of gait and mobility (R26.89);Difficulty in walking, not elsewhere classified (R26.2);Other symptoms and signs involving the nervous system (R29.898)     Time: 2094-7096 PT Time Calculation (min) (ACUTE ONLY): 30 min  Charges:  $Gait Training: 8-22 mins $Therapeutic Activity: 8-22 mins                     Leighton Roach, [place]  Pager [phone] Office [place] 03/17/2020, 2:55 PM

## 2020-03-17 NOTE — TOC Progression Note (Signed)
Transition of Care [facility]) - Progression Note    Patient Details  Name: Susan [name] MRN: [id number] Date of Birth: 1960-11-12  Transition of Care [facility]) CM/SW [place], LCSW Phone Number: 03/17/2020, 9:56 AM  Clinical Narrative:    Patient continues to have no SNF bed offers. CSW discussing case with [facility].    Expected Discharge Plan: [place] Barriers to Discharge: Continued Medical Work up  Expected Discharge Plan and Services Expected Discharge Plan: [place] In-house Referral: Clinical Social Work Discharge Planning Services: CM Consult Post Acute Care Choice: [place] arrangements for the past 2 months: Single Family Home                                       Social Determinants of Health (SDOH) Interventions    Readmission Risk Interventions Readmission Risk Prevention Plan 03/11/2020 02/19/2019  Transportation Screening Complete Complete  Medication Review [employer]) Referral to Pharmacy Complete  PCP or Specialist appointment within 3-5 days of discharge Complete Complete  HRI or Home Care Consult Complete Complete  SW Recovery Care/Counseling Consult Complete Complete  Palliative Care Screening Not Applicable Not [place] Complete Not Applicable  Some recent data might be hidden

## 2020-03-18 LAB — GLUCOSE, CAPILLARY
Glucose-Capillary: 249 mg/dL — ABNORMAL HIGH (ref 70–99)
Glucose-Capillary: 176 mg/dL — ABNORMAL HIGH (ref 70–99)
Glucose-Capillary: 118 mg/dL — ABNORMAL HIGH (ref 70–99)
Glucose-Capillary: 169 mg/dL — ABNORMAL HIGH (ref 70–99)

## 2020-03-18 MED ORDER — ONDANSETRON HCL 4 MG/2ML IJ SOLN
4.0000 mg | Freq: Four times a day (QID) | INTRAMUSCULAR | Status: DC | PRN
  Administered 2020-03-18: 4 mg via INTRAVENOUS
  Filled 2020-03-18: qty 2

## 2020-03-18 NOTE — Progress Notes (Addendum)
Progress Note    Susan [name]  VQQ:[id number] DOB: [birth year]  DOA: 03/08/2020 PCP: Hoyt Koch, MD      Brief Narrative:    Medical records reviewed and are as summarized below:  Susan [name] is a 59 y.o. female with medical history of HTN, HIV, DM, seizure disorder, bipolar disorder, prolonged hospitalization from 3/10-5/8 for respiratory failure/encephalopathy/septic shock/CVA-required intubation-difficult to wean off ventilator-tracheostomy/PEG tube placement-she was eventually decannulated on 4/28-discharged to SNF-presented to the hospital on 7/6 for altered mental status, hypoglycemia/hypotension-thought to be multifactorial-from unintentional narcotic overdose-she was transferred to the ICU-required Narcan infusion-upon stability-she was transferred to [facility] service.  MRI of the brain done this admission was positive for acute/subacute infarct involving the right PCA/right MCA watershed area.   Significant events: 7/6>> presented to [facility] with confusion/hypoglycemia/hypotension found to have AKI-requiring several doses of Narcan-eventually Narcan drip for presumed unintentional narcotic overdose. 7/8>> transfer to [facility] from ICU.  Significant studies: 3/16>> TEE: EF 55-60% 3/23>> daughter Doppler: No significant stenosis. 3/23>> lower extremity Doppler: No DVT. 7/6>> chest x-ray: Low volume chest with indistinct opacity-that could be scarring/active infection 7/6>> CT head: Age-indeterminate right parasagittal occipital infarct with bilateral extension. 7/6>> CT C-spine: No acute abnormality noted in the C-spine 7/6>> renal ultrasound: Negative for obstructive uropathy 7/7>> MRI brain: Acute to subacute infarct involving the right PCA and right MCA/PCA watershed area 7/7>> MRI brain: Stable intracranial MRA since March-with no large vessel occlusion. 7/7>> EEG: No seizures.  Antimicrobial therapy: Unasyn: 7/6>>7/11  Microbiology data: 7/6>> urine  culture:<10,000 colonies/mL insignificant growth 7/6>> blood culture: No growth  Procedures : 7/8>> right jugular central line by IR (pulled out by patient later on 7/8 evening)  Consults: PCCM Neurology   Assessment/Plan:   Principal Problem:   Acute encephalopathy Active Problems:   HIV disease ([place])   Schizoaffective disorder, bipolar type ([place])   Morbid obesity ([place])   GERD (gastroesophageal reflux disease)   Diabetes mellitus type 2 without retinopathy ([place])   Hypoglycemia   MDD (major depressive disorder), recurrent episode, moderate (H[facility])   Hypotension   AKI (acute kidney injury) ([place])   Schizophrenia ([place])   Acute toxic metabolic encephalopathy: Multifactorial-likely narcotic overdose, hypoglycemia, AKI and sepsis physiology all contributing.  Mental status improved after treatment of underlying conditions  Suspected narcotic overdose: Although urine drug screen negative-she responded well (per PCCM note) to Narcan infusion.  Apparently, patient was started on Nucynta 1 day prior to this hospital stay-(not listed on home med list)-urine drug screen negative as well.  Avoid narcotics as much as possible.  Hypoglycemia: Suspect likely secondary to poor oral intake in the setting of confusion-resolved-CBGs stable with SSI.  Sepsis acute hypoxic respiratory failure likely secondary to aspiration pneumonia: Sepsis physiology has resolved-blood cultures negative-Unasyn completed as off 7/11.  Has been titrated off oxygen and stable on room air.  AKI: Likely hemodynamically mediated-creatinine has significantly improved with supportive care.  Renal ultrasound was negative for hydronephrosis.  Follow renal function periodically.  Acute CVA: Apparently had transient left-sided weakness that has resolved. LDL 52, A1c 11.7-had stroke work-up during her most recent admission-neurology recommending aspirin.  HIV: Patient CD4 count on 6/2 882-continue  antiretrovirals.  HTN: Continue antihypertensives  DM-2 with hyperglycemia (A1c 11.7 on 7/6): Had hypoglycemia on admission.  Continue Lantus and NovoLog as needed for hyperglycemia.    Nutrition Problem: Increased nutrient needs Etiology: acute illness  Signs/Symptoms: estimated needs   Body mass index is 36.75 kg/m.  Diet Order  Diet regular Room service appropriate? Yes with Assist; Fluid consistency: Thin  Diet effective now                       Medications:   . amLODipine  5 mg Oral Daily  . aspirin  81 mg Oral Daily  . bictegravir-emtricitabine-tenofovir AF  1 tablet Oral Daily  . carvedilol  3.125 mg Oral BID  . chlorhexidine  15 mL Mouth Rinse BID  . Chlorhexidine Gluconate Cloth  6 each Topical Daily  . doxepin  50 mg Oral QHS  . gabapentin  100 mg Oral BID  . heparin  5,000 Units Subcutaneous Q8H  . insulin aspart  0-15 Units Subcutaneous TID AC & HS  . insulin aspart  4 Units Subcutaneous TID WC  . insulin glargine  18 Units Subcutaneous Daily  . lidocaine  1 patch Transdermal Q24H  . mouth rinse  15 mL Mouth Rinse q12n4p  . pantoprazole  40 mg Oral Daily  . QUEtiapine  200 mg Oral BID  . rosuvastatin  20 mg Oral Daily  . umeclidinium-vilanterol  1 puff Inhalation Daily   Continuous Infusions: . sodium chloride Stopped (03/09/20 1213)     Anti-infectives (From admission, onward)   Start     Dose/Rate Route Frequency Ordered Stop   03/10/20 1400  Ampicillin-Sulbactam (UNASYN) 3 g in sodium chloride 0.9 % 100 mL IVPB  Status:  Discontinued        3 g 200 mL/hr over 30 Minutes Intravenous Every 8 hours 03/10/20 1324 03/14/20 0559   03/09/20 0600  ampicillin-sulbactam (UNASYN) 1.5 g in sodium chloride 0.9 % 100 mL IVPB  Status:  Discontinued        1.5 g 200 mL/hr over 30 Minutes Intravenous Every 12 hours 03/08/20 1716 03/10/20 1324   03/08/20 1930  bictegravir-emtricitabine-tenofovir AF (BIKTARVY) 50-200-25 MG per tablet 1  tablet     Discontinue     1 tablet Oral Daily 03/08/20 1453     03/08/20 1515  Ampicillin-Sulbactam (UNASYN) 3 g in sodium chloride 0.9 % 100 mL IVPB        3 g 200 mL/hr over 30 Minutes Intravenous  Once 03/08/20 1500 03/08/20 2126             Family Communication/Anticipated D/C date and plan/Code Status   DVT prophylaxis: SCDs Start: 03/08/20 2005 heparin injection 5,000 Units Start: 03/08/20 1500     Code Status: Full Code  Family Communication: Plan discussed with patient Disposition Plan:    Status is: Inpatient  Remains inpatient appropriate because:Unsafe d/c plan   Dispo:  Patient From: Home  Planned Disposition: [place].  Awaiting placement.  Expected discharge date: Tomorrow  Medically stable for discharge: Yes            Subjective:   No new complaints.  She wanted to know whether she has had any bed offers from the nursing home..  Objective:    Vitals:   03/17/20 1300 03/17/20 2016 03/18/20 0440 03/18/20 0929  BP: 113/77 (!) 146/82 (!) 148/84 (!) 144/76  Pulse: (!) 58 84 95 88  Resp: 15 19 15    Temp: 98 F (36.7 C) 98.9 F (37.2 C) 98.6 F (37 C)   TempSrc: Oral Oral Oral   SpO2: 100% 99% 94%   Weight:   94.1 kg   Height:       No data found.   Intake/Output Summary (Last 24 hours) at 03/18/2020 1333 Last  data filed at 03/18/2020 0445 Gross per 24 hour  Intake 840 ml  Output 900 ml  Net -60 ml   Filed Weights   03/16/20 0513 03/17/20 0435 03/18/20 0440  Weight: 92.2 kg 92 kg 94.1 kg    Exam:  GEN: No acute distress SKIN: Warm and dry EYES: No pallor or icterus ENT: MMM CV: RRR PULM: Clear to auscultation bilaterally ABD: soft, obese, NT, +BS CNS: AAO x 3, non focal EXT: No edema or tenderness   Data Reviewed:   I have personally reviewed following labs and imaging studies:  Labs: Labs show the following:   Basic Metabolic Panel: Recent Labs  Lab 03/12/20 0306  NA 139  K 3.8  CL  106  CO2 23  GLUCOSE 184*  BUN 9  CREATININE 1.01*  CALCIUM 8.7*   GFR Estimated Creatinine Clearance: 67 mL/min (A) (by C-G formula based on SCr of 1.01 mg/dL (H)). Liver Function Tests: No results for input(s): AST, ALT, ALKPHOS, BILITOT, PROT, ALBUMIN in the last 168 hours. No results for input(s): LIPASE, AMYLASE in the last 168 hours. No results for input(s): AMMONIA in the last 168 hours. Coagulation profile No results for input(s): INR, PROTIME in the last 168 hours.  CBC: No results for input(s): WBC, NEUTROABS, HGB, HCT, MCV, PLT in the last 168 hours. Cardiac Enzymes: No results for input(s): CKTOTAL, CKMB, CKMBINDEX, TROPONINI in the last 168 hours. BNP (last 3 results) No results for input(s): PROBNP in the last 8760 hours. CBG: Recent Labs  Lab 03/17/20 1249 03/17/20 1707 03/17/20 2047 03/18/20 0753 03/18/20 1151  GLUCAP 228* 191* 234* 249* 176*   D-Dimer: No results for input(s): DDIMER in the last 72 hours. Hgb A1c: No results for input(s): HGBA1C in the last 72 hours. Lipid Profile: No results for input(s): CHOL, HDL, LDLCALC, TRIG, CHOLHDL, LDLDIRECT in the last 72 hours. Thyroid function studies: No results for input(s): TSH, T4TOTAL, T3FREE, THYROIDAB in the last 72 hours.  Invalid input(s): FREET3 Anemia work up: No results for input(s): VITAMINB12, FOLATE, FERRITIN, TIBC, IRON, RETICCTPCT in the last 72 hours. Sepsis Labs: No results for input(s): PROCALCITON, WBC, LATICACIDVEN in the last 168 hours.  Microbiology Recent Results (from the past 240 hour(s))  Culture, blood (routine x 2)     Status: None   Collection Time: 03/08/20  1:36 PM   Specimen: BLOOD  Result Value Ref Range Status   Specimen Description BLOOD BLOOD LEFT HAND  Final   Special Requests   Final    BOTTLES DRAWN AEROBIC ONLY Blood Culture results may not be optimal due to an inadequate volume of blood received in culture bottles   Culture   Final    NO GROWTH 5  DAYS Performed at [facility], [place] [address] [place], [place] [address]    Report Status 03/13/2020 FINAL  Final  Culture, blood (routine x 2)     Status: None   Collection Time: 03/08/20  7:19 PM   Specimen: BLOOD LEFT FOREARM  Result Value Ref Range Status   Specimen Description BLOOD LEFT FOREARM  Final   Special Requests AEROBIC BOTTLE ONLY Blood Culture adequate volume  Final   Culture   Final    NO GROWTH 5 DAYS Performed at [facility], [place] [address], [place], [place] [address]    Report Status 03/13/2020 FINAL  Final  MRSA PCR Screening     Status: Abnormal   Collection Time: 03/09/20  9:41 AM  Specimen: Nasal Mucosa; Nasopharyngeal  Result Value Ref Range Status   MRSA by PCR POSITIVE (A) NEGATIVE Final    Comment:        The GeneXpert MRSA Assay (FDA approved for NASAL specimens only), is one component of a comprehensive MRSA colonization surveillance program. It is not intended to diagnose MRSA infection nor to guide or monitor treatment for MRSA infections. CRITICAL RESULT CALLED TO, READ BACK BY AND VERIFIED WITH: RN Maggie Font AT 1121 03/09/20 BY MM Performed at [facility], [place] [address] [place], [place] [address]     Procedures and diagnostic studies:  No results found.             LOS: 10 days   Susan [name]  [facility]     03/18/2020, 1:33 PM

## 2020-03-19 LAB — GLUCOSE, CAPILLARY
Glucose-Capillary: 146 mg/dL — ABNORMAL HIGH (ref 70–99)
Glucose-Capillary: 234 mg/dL — ABNORMAL HIGH (ref 70–99)
Glucose-Capillary: 162 mg/dL — ABNORMAL HIGH (ref 70–99)
Glucose-Capillary: 149 mg/dL — ABNORMAL HIGH (ref 70–99)

## 2020-03-19 NOTE — Progress Notes (Signed)
Inpatient Diabetes Program Recommendations  AACE/ADA: New Consensus Statement on Inpatient Glycemic Control (2015)  Target Ranges:  Prepandial:   less than 140 mg/dL      Peak postprandial:   less than 180 mg/dL (1-2 hours)      Critically ill patients:  140 - 180 mg/dL   Lab Results  Component Value Date   GLUCAP 234 (H) 03/19/2020   HGBA1C 11.7 (H) 03/08/2020  Results for Susan [name], Susan [name] (MRN [id number]) as of 03/19/2020 14:04  Ref. Range 11/11/2019 17:38 03/08/2020 19:15  Hemoglobin A1C Latest Ref Range: 4.8 - 5.6 % 12.3 (H) 11.7 (H)    Review of Glycemic Control Results for Susan [name], Susan [name] (MRN [id number]) as of 03/19/2020 14:04  Ref. Range 03/18/2020 17:01 03/18/2020 21:06 03/19/2020 08:26 03/19/2020 12:53  Glucose-Capillary Latest Ref Range: 70 - 99 mg/dL 118 (H) 169 (H) 146 (H) 234 (H)  Diabetes history:DM Outpatient Diabetes medications:Lantus 30 units qd + Humalog 0-20 tid meal coverage Current orders for Inpatient glycemic control:Lantus 18 units qd +Novolog correction 0-15 units qid Inpatient Diabetes Program Recommendations:    Referral received.  Patient has long-standing DM history.  A1C is actually improved from March 2021.  See's Dr. Adrian Prince for DM management.  Agree with current orders.  D/C plan is for SNF.  Will have DM coord. follow up with patient on 03/21/20.   Thanks, Adah Perl, RN, BC-ADM Inpatient Diabetes Coordinator Pager [phone]

## 2020-03-19 NOTE — Progress Notes (Signed)
Progress Note    Susan [name]  EGB:[id number] DOB: 12-14-1960  DOA: 03/08/2020 PCP: Hoyt Koch, MD      Brief Narrative:    Medical records reviewed and are as summarized below:  Susan [name] is a 59 y.o. female with medical history of HTN, HIV, DM, seizure disorder, bipolar disorder, prolonged hospitalization from 3/10-5/8 for respiratory failure/encephalopathy/septic shock/CVA-required intubation-difficult to wean off ventilator-tracheostomy/PEG tube placement-she was eventually decannulated on 4/28-discharged to SNF-presented to the hospital on 7/6 for altered mental status, hypoglycemia/hypotension-thought to be multifactorial-from unintentional narcotic overdose-she was transferred to the ICU-required Narcan infusion-upon stability-she was transferred to [facility] service.  MRI of the brain done this admission was positive for acute/subacute infarct involving the right PCA/right MCA watershed area.   Significant events: 7/6>> presented to [facility] with confusion/hypoglycemia/hypotension found to have AKI-requiring several doses of Narcan-eventually Narcan drip for presumed unintentional narcotic overdose. 7/8>> transfer to [facility] from ICU.  Significant studies: 3/16>> TEE: EF 55-60% 3/23>> daughter Doppler: No significant stenosis. 3/23>> lower extremity Doppler: No DVT. 7/6>> chest x-ray: Low volume chest with indistinct opacity-that could be scarring/active infection 7/6>> CT head: Age-indeterminate right parasagittal occipital infarct with bilateral extension. 7/6>> CT C-spine: No acute abnormality noted in the C-spine 7/6>> renal ultrasound: Negative for obstructive uropathy 7/7>> MRI brain: Acute to subacute infarct involving the right PCA and right MCA/PCA watershed area 7/7>> MRI brain: Stable intracranial MRA since March-with no large vessel occlusion. 7/7>> EEG: No seizures.  Antimicrobial therapy: Unasyn: 7/6>>7/11  Microbiology data: 7/6>> urine  culture:<10,000 colonies/mL insignificant growth 7/6>> blood culture: No growth  Procedures : 7/8>> right jugular central line by IR (pulled out by patient later on 7/8 evening)  Consults: PCCM Neurology   Assessment/Plan:    Acute toxic metabolic encephalopathy: Multifactorial-likely narcotic overdose, hypoglycemia, AKI and sepsis physiology all contributing.  Mental status improved, currently close to baseline.  Suspected narcotic overdose: Although urine drug screen negative-she responded well (per PCCM note) to Narcan infusion.  Apparently, patient was started on Nucynta 1 day prior to this hospital stay-(not listed on home med list)-urine drug screen negative as well.  Avoid narcotics as much as possible.  Now close to baseline.  Hypoglycemia: Suspect likely secondary to poor oral intake in the setting of confusion-resolved-CBGs stable with SSI.  Sepsis acute hypoxic respiratory failure likely secondary to aspiration pneumonia: Sepsis physiology has resolved-blood cultures negative-Unasyn completed as off 7/11.  Has been titrated off oxygen and stable on room air.  AKI: Likely hemodynamically mediated-creatinine has significantly improved with supportive care.  Renal ultrasound was negative for hydronephrosis.  Follow renal function periodically.  Acute CVA: Apparently had transient left-sided weakness that has resolved. LDL 52, A1c 11.7-had stroke work-up during her most recent admission-neurology recommending aspirin and statin which will be continued.  HIV: Patient CD4 count on 6/2 882-continue antiretrovirals.  HTN: Continue antihypertensives, currently on combination of Norvasc and Coreg.  GERD.  On PPI.    DM-2 with hyperglycemia (A1c 11.7 on 7/6): Had hypoglycemia on admission.  Continue Lantus and NovoLog as needed for hyperglycemia.  Poor outpatient control due to hyperglycemia.  Diabetes and insulin education  Lab Results  Component Value Date   HGBA1C  11.7 (H) 03/08/2020   CBG (last 3)  Recent Labs    03/18/20 1701 03/18/20 2106 03/19/20 0826  GLUCAP 118* 169* 146*     Nutrition Problem: Increased nutrient needs Etiology: acute illness  Signs/Symptoms: estimated needs   Body mass index is 36.16 kg/m.  Diet Order            Diet regular Room service appropriate? Yes with Assist; Fluid consistency: Thin  Diet effective now                 Medications:   . amLODipine  5 mg Oral Daily  . aspirin  81 mg Oral Daily  . bictegravir-emtricitabine-tenofovir AF  1 tablet Oral Daily  . carvedilol  3.125 mg Oral BID  . chlorhexidine  15 mL Mouth Rinse BID  . Chlorhexidine Gluconate Cloth  6 each Topical Daily  . doxepin  50 mg Oral QHS  . gabapentin  100 mg Oral BID  . heparin  5,000 Units Subcutaneous Q8H  . insulin aspart  0-15 Units Subcutaneous TID AC & HS  . insulin aspart  4 Units Subcutaneous TID WC  . insulin glargine  18 Units Subcutaneous Daily  . lidocaine  1 patch Transdermal Q24H  . mouth rinse  15 mL Mouth Rinse q12n4p  . pantoprazole  40 mg Oral Daily  . QUEtiapine  200 mg Oral BID  . rosuvastatin  20 mg Oral Daily  . umeclidinium-vilanterol  1 puff Inhalation Daily   Continuous Infusions: . sodium chloride Stopped (03/09/20 1213)     Anti-infectives (From admission, onward)   Start     Dose/Rate Route Frequency Ordered Stop   03/10/20 1400  Ampicillin-Sulbactam (UNASYN) 3 g in sodium chloride 0.9 % 100 mL IVPB  Status:  Discontinued        3 g 200 mL/hr over 30 Minutes Intravenous Every 8 hours 03/10/20 1324 03/14/20 0559   03/09/20 0600  ampicillin-sulbactam (UNASYN) 1.5 g in sodium chloride 0.9 % 100 mL IVPB  Status:  Discontinued        1.5 g 200 mL/hr over 30 Minutes Intravenous Every 12 hours 03/08/20 1716 03/10/20 1324   03/08/20 1930  bictegravir-emtricitabine-tenofovir AF (BIKTARVY) 50-200-25 MG per tablet 1 tablet     Discontinue     1 tablet Oral Daily 03/08/20 1453     03/08/20 1515   Ampicillin-Sulbactam (UNASYN) 3 g in sodium chloride 0.9 % 100 mL IVPB        3 g 200 mL/hr over 30 Minutes Intravenous  Once 03/08/20 1500 03/08/20 2126      Family Communication/Anticipated D/C date and plan/Code Status   DVT prophylaxis: SCDs Start: 03/08/20 2005 heparin injection 5,000 Units Start: 03/08/20 1500     Code Status: Full Code  Family Communication: Plan discussed with patient, number listed for daughter going to business/shop answering machine  Disposition Plan:    Status is: Inpatient  Remains inpatient appropriate because:Unsafe d/c plan   Dispo:  Patient From: Home  Planned Disposition: [place].  Awaiting placement.  Expected discharge date: Tomorrow  Medically stable for discharge: Yes    Subjective:   Patient in bed, appears comfortable, denies any headache, no fever, no chest pain or pressure, no shortness of breath , no abdominal pain. No focal weakness.  Objective:    Vitals:   03/18/20 0929 03/18/20 1500 03/18/20 2055 03/19/20 0507  BP: (!) 144/76 (!) 145/79 134/89 130/81  Pulse: 88 89 95 85  Resp:  14 14 17   Temp:  98.2 F (36.8 C) 98.8 F (37.1 C) 98.2 F (36.8 C)  TempSrc:  Oral Oral Oral  SpO2:  97% 99% 97%  Weight:    92.6 kg  Height:       No data found.   Intake/Output Summary (  Last 24 hours) at 03/19/2020 0937 Last data filed at 03/19/2020 0528 Gross per 24 hour  Intake 920 ml  Output 1350 ml  Net -430 ml   Filed Weights   03/17/20 0435 03/18/20 0440 03/19/20 0507  Weight: 92 kg 94.1 kg 92.6 kg    Exam:  Awake Alert, No new F.N deficits,   [place].AT,PERRAL Supple Neck,No JVD, No cervical lymphadenopathy appriciated.  Symmetrical Chest wall movement, Good air movement bilaterally, CTAB RRR,No Gallops, Rubs or new Murmurs, No Parasternal Heave +ve B.Sounds, Abd Soft, No tenderness, No organomegaly appriciated, No rebound - guarding or rigidity. No Cyanosis, Clubbing or edema, No new Rash or  bruise   Data Reviewed:   I have personally reviewed following labs and imaging studies:  Labs: Labs show the following:   Basic Metabolic Panel: No results for input(s): NA, K, CL, CO2, GLUCOSE, BUN, CREATININE, CALCIUM, MG, PHOS in the last 168 hours. GFR Estimated Creatinine Clearance: 66.5 mL/min (A) (by C-G formula based on SCr of 1.01 mg/dL (H)). Liver Function Tests: No results for input(s): AST, ALT, ALKPHOS, BILITOT, PROT, ALBUMIN in the last 168 hours. No results for input(s): LIPASE, AMYLASE in the last 168 hours. No results for input(s): AMMONIA in the last 168 hours. Coagulation profile No results for input(s): INR, PROTIME in the last 168 hours.  CBC: No results for input(s): WBC, NEUTROABS, HGB, HCT, MCV, PLT in the last 168 hours. Cardiac Enzymes: No results for input(s): CKTOTAL, CKMB, CKMBINDEX, TROPONINI in the last 168 hours. BNP (last 3 results) No results for input(s): PROBNP in the last 8760 hours. CBG: Recent Labs  Lab 03/18/20 0753 03/18/20 1151 03/18/20 1701 03/18/20 2106 03/19/20 0826  GLUCAP 249* 176* 118* 169* 146*   D-Dimer: No results for input(s): DDIMER in the last 72 hours. Hgb A1c: No results for input(s): HGBA1C in the last 72 hours. Lipid Profile: No results for input(s): CHOL, HDL, LDLCALC, TRIG, CHOLHDL, LDLDIRECT in the last 72 hours. Thyroid function studies: No results for input(s): TSH, T4TOTAL, T3FREE, THYROIDAB in the last 72 hours.  Invalid input(s): FREET3 Anemia work up: No results for input(s): VITAMINB12, FOLATE, FERRITIN, TIBC, IRON, RETICCTPCT in the last 72 hours. Sepsis Labs: No results for input(s): PROCALCITON, WBC, LATICACIDVEN in the last 168 hours.  Microbiology Recent Results (from the past 240 hour(s))  MRSA PCR Screening     Status: Abnormal   Collection Time: 03/09/20  9:41 AM   Specimen: Nasal Mucosa; Nasopharyngeal  Result Value Ref Range Status   MRSA by PCR POSITIVE (A) NEGATIVE Final     Comment:        The GeneXpert MRSA Assay (FDA approved for NASAL specimens only), is one component of a comprehensive MRSA colonization surveillance program. It is not intended to diagnose MRSA infection nor to guide or monitor treatment for MRSA infections. CRITICAL RESULT CALLED TO, READ BACK BY AND VERIFIED WITH: RN Maggie Font AT 1121 03/09/20 BY MM Performed at [facility], [place] [address] [place], [place] [address]     Procedures and diagnostic studies:  No results found.  Signature  Lala Lund M.D on 03/19/2020 at 9:37 AM   -  To page go to [email or web address]

## 2020-03-20 LAB — BASIC METABOLIC PANEL
Anion gap: 10 (ref 5–15)
BUN: 12 mg/dL (ref 6–20)
CO2: 25 mmol/L (ref 22–32)
Calcium: 9.1 mg/dL (ref 8.9–10.3)
Chloride: 104 mmol/L (ref 98–111)
Creatinine, Ser: 1.05 mg/dL — ABNORMAL HIGH (ref 0.44–1.00)
GFR calc Af Amer: >60 mL/min (ref >60)
GFR calc non Af Amer: 59 mL/min — ABNORMAL LOW (ref >60)
Glucose, Bld: 190 mg/dL — ABNORMAL HIGH (ref 70–99)
Potassium: 4.2 mmol/L (ref 3.5–5.1)
Sodium: 139 mmol/L (ref 135–145)

## 2020-03-20 LAB — GLUCOSE, CAPILLARY
Glucose-Capillary: 194 mg/dL — ABNORMAL HIGH (ref 70–99)
Glucose-Capillary: 120 mg/dL — ABNORMAL HIGH (ref 70–99)
Glucose-Capillary: 223 mg/dL — ABNORMAL HIGH (ref 70–99)
Glucose-Capillary: 205 mg/dL — ABNORMAL HIGH (ref 70–99)

## 2020-03-20 LAB — CBC
HCT: 41.7 % (ref 36.0–46.0)
Hemoglobin: 13.2 g/dL (ref 12.0–15.0)
MCH: 27.7 pg (ref 26.0–34.0)
MCHC: 31.7 g/dL (ref 30.0–36.0)
MCV: 87.6 fL (ref 80.0–100.0)
Platelets: 253 10*3/uL (ref 150–400)
RBC: 4.76 MIL/uL (ref 3.87–5.11)
RDW: 14.1 % (ref 11.5–15.5)
WBC: 10.1 10*3/uL (ref 4.0–10.5)
nRBC: 0 % (ref 0.0–0.2)

## 2020-03-20 LAB — MAGNESIUM: Magnesium: 1.8 mg/dL (ref 1.7–2.4)

## 2020-03-20 NOTE — Progress Notes (Signed)
Progress Note    Susan [name]  QQV:[id number] DOB: 08/24/1961  DOA: 03/08/2020 PCP: Hoyt Koch, MD      Brief Narrative:    Medical records reviewed and are as summarized below:  Susan [name] is a 59 y.o. female with medical history of HTN, HIV, DM, seizure disorder, bipolar disorder, prolonged hospitalization from 3/10-5/8 for respiratory failure/encephalopathy/septic shock/CVA-required intubation-difficult to wean off ventilator-tracheostomy/PEG tube placement-she was eventually decannulated on 4/28-discharged to SNF-presented to the hospital on 7/6 for altered mental status, hypoglycemia/hypotension-thought to be multifactorial-from unintentional narcotic overdose-she was transferred to the ICU-required Narcan infusion-upon stability-she was transferred to [facility] service.  MRI of the brain done this admission was positive for acute/subacute infarct involving the right PCA/right MCA watershed area.   Significant events: 7/6>> presented to [facility] with confusion/hypoglycemia/hypotension found to have AKI-requiring several doses of Narcan-eventually Narcan drip for presumed unintentional narcotic overdose. 7/8>> transfer to [facility] from ICU.  Significant studies: 3/16>> TEE: EF 55-60% 3/23>> daughter Doppler: No significant stenosis. 3/23>> lower extremity Doppler: No DVT. 7/6>> chest x-ray: Low volume chest with indistinct opacity-that could be scarring/active infection 7/6>> CT head: Age-indeterminate right parasagittal occipital infarct with bilateral extension. 7/6>> CT C-spine: No acute abnormality noted in the C-spine 7/6>> renal ultrasound: Negative for obstructive uropathy 7/7>> MRI brain: Acute to subacute infarct involving the right PCA and right MCA/PCA watershed area 7/7>> MRI brain: Stable intracranial MRA since March-with no large vessel occlusion. 7/7>> EEG: No seizures.  Antimicrobial therapy: Unasyn: 7/6>>7/11  Microbiology data: 7/6>> urine  culture:<10,000 colonies/mL insignificant growth 7/6>> blood culture: No growth  Procedures : 7/8>> right jugular central line by IR (pulled out by patient later on 7/8 evening)  Consults: PCCM Neurology   Assessment/Plan:    Acute toxic metabolic encephalopathy: Multifactorial-likely narcotic overdose, hypoglycemia, AKI and sepsis physiology all contributing.  Mental status improved, currently close to baseline.  Suspected narcotic overdose: Although urine drug screen negative-she responded well (per PCCM note) to Narcan infusion.  Apparently, patient was started on Nucynta 1 day prior to this hospital stay-(not listed on home med list)-urine drug screen negative as well.  Avoid narcotics as much as possible.  Now close to baseline.  Hypoglycemia: Suspect likely secondary to poor oral intake in the setting of confusion-resolved-CBGs stable with SSI.  Sepsis acute hypoxic respiratory failure likely secondary to aspiration pneumonia: Sepsis physiology has resolved-blood cultures negative-Unasyn completed as off 7/11.  Has been titrated off oxygen and stable on room air.  AKI: Likely hemodynamically mediated-creatinine has significantly improved with supportive care.  Renal ultrasound was negative for hydronephrosis.  Follow renal function periodically.  Acute CVA: Apparently had transient left-sided weakness that has resolved. LDL 52, A1c 11.7-had stroke work-up during her most recent admission-neurology recommending aspirin and statin which will be continued.  HIV: Patient CD4 count on 6/2 882-continue antiretrovirals.  HTN: Continue antihypertensives, currently on combination of Norvasc and Coreg.  GERD.  On PPI.    DM-2 with hyperglycemia (A1c 11.7 on 7/6): Had hypoglycemia on admission.  Continue Lantus and NovoLog as needed for hyperglycemia.  Poor outpatient control due to hyperglycemia.  Diabetes and insulin education  Lab Results  Component Value Date   HGBA1C  11.7 (H) 03/08/2020   CBG (last 3)  Recent Labs    03/19/20 1634 03/19/20 2144 03/20/20 0749  GLUCAP 162* 149* 194*     Nutrition Problem: Increased nutrient needs Etiology: acute illness  Signs/Symptoms: estimated needs   Body mass index is 34.25 kg/m.  Diet Order            Diet regular Room service appropriate? Yes with Assist; Fluid consistency: Thin  Diet effective now                 Medications:   . amLODipine  5 mg Oral Daily  . aspirin  81 mg Oral Daily  . bictegravir-emtricitabine-tenofovir AF  1 tablet Oral Daily  . carvedilol  3.125 mg Oral BID  . chlorhexidine  15 mL Mouth Rinse BID  . Chlorhexidine Gluconate Cloth  6 each Topical Daily  . doxepin  50 mg Oral QHS  . gabapentin  100 mg Oral BID  . heparin  5,000 Units Subcutaneous Q8H  . insulin aspart  0-15 Units Subcutaneous TID AC & HS  . insulin aspart  4 Units Subcutaneous TID WC  . insulin glargine  18 Units Subcutaneous Daily  . lidocaine  1 patch Transdermal Q24H  . mouth rinse  15 mL Mouth Rinse q12n4p  . pantoprazole  40 mg Oral Daily  . QUEtiapine  200 mg Oral BID  . rosuvastatin  20 mg Oral Daily  . umeclidinium-vilanterol  1 puff Inhalation Daily   Continuous Infusions:    Anti-infectives (From admission, onward)   Start     Dose/Rate Route Frequency Ordered Stop   03/10/20 1400  Ampicillin-Sulbactam (UNASYN) 3 g in sodium chloride 0.9 % 100 mL IVPB  Status:  Discontinued        3 g 200 mL/hr over 30 Minutes Intravenous Every 8 hours 03/10/20 1324 03/14/20 0559   03/09/20 0600  ampicillin-sulbactam (UNASYN) 1.5 g in sodium chloride 0.9 % 100 mL IVPB  Status:  Discontinued        1.5 g 200 mL/hr over 30 Minutes Intravenous Every 12 hours 03/08/20 1716 03/10/20 1324   03/08/20 1930  bictegravir-emtricitabine-tenofovir AF (BIKTARVY) 50-200-25 MG per tablet 1 tablet     Discontinue     1 tablet Oral Daily 03/08/20 1453     03/08/20 1515  Ampicillin-Sulbactam (UNASYN) 3 g in sodium  chloride 0.9 % 100 mL IVPB        3 g 200 mL/hr over 30 Minutes Intravenous  Once 03/08/20 1500 03/08/20 2126      Family Communication/Anticipated D/C date and plan/Code Status   DVT prophylaxis: SCDs Start: 03/08/20 2005 heparin injection 5,000 Units Start: 03/08/20 1500     Code Status: Full Code  Family Communication: Plan discussed with patient, number listed for daughter going to business/shop answering machine  Disposition Plan:    Status is: Inpatient  Remains inpatient appropriate because:Unsafe d/c plan   Dispo:  Patient From: Home  Planned Disposition: [place].  Awaiting placement.  Expected discharge date: Tomorrow  Medically stable for discharge: Yes    Subjective:   Patient in bed, appears comfortable, denies any headache, no fever, no chest pain or pressure, no shortness of breath , no abdominal pain. No focal weakness.   Objective:    Vitals:   03/19/20 0943 03/19/20 1437 03/19/20 2049 03/20/20 0656  BP: (!) 146/66 (!) 119/98 (!) 131/97 121/67  Pulse:  83 86 86  Resp:  18 14 15   Temp:  98.8 F (37.1 C) 98.4 F (36.9 C) 98.2 F (36.8 C)  TempSrc:  Oral Oral Oral  SpO2:  96% 99% 94%  Weight:    87.7 kg  Height:       No data found.  No intake or output data in the 24  hours ending 03/20/20 1042 Filed Weights   03/18/20 0440 03/19/20 0507 03/20/20 0656  Weight: 94.1 kg 92.6 kg 87.7 kg    Exam:  Awake Alert, No new F.N deficits  [place].AT,PERRAL Supple Neck,No JVD, No cervical lymphadenopathy appriciated.  Symmetrical Chest wall movement, Good air movement bilaterally, CTAB RRR,No Gallops, Rubs or new Murmurs, No Parasternal Heave +ve B.Sounds, Abd Soft, No tenderness, No organomegaly appriciated, No rebound - guarding or rigidity. No Cyanosis, Clubbing or edema, No new Rash or bruise    Data Reviewed:   I have personally reviewed following labs and imaging studies:  Labs: Labs show the following:   Basic  Metabolic Panel: Recent Labs  Lab 03/20/20 0141  NA 139  K 4.2  CL 104  CO2 25  GLUCOSE 190*  BUN 12  CREATININE 1.05*  CALCIUM 9.1  MG 1.8   GFR Estimated Creatinine Clearance: 62.1 mL/min (A) (by C-G formula based on SCr of 1.05 mg/dL (H)). Liver Function Tests: No results for input(s): AST, ALT, ALKPHOS, BILITOT, PROT, ALBUMIN in the last 168 hours. No results for input(s): LIPASE, AMYLASE in the last 168 hours. No results for input(s): AMMONIA in the last 168 hours. Coagulation profile No results for input(s): INR, PROTIME in the last 168 hours.  CBC: Recent Labs  Lab 03/20/20 0141  WBC 10.1  HGB 13.2  HCT 41.7  MCV 87.6  PLT 253   Cardiac Enzymes: No results for input(s): CKTOTAL, CKMB, CKMBINDEX, TROPONINI in the last 168 hours. BNP (last 3 results) No results for input(s): PROBNP in the last 8760 hours. CBG: Recent Labs  Lab 03/19/20 0826 03/19/20 1253 03/19/20 1634 03/19/20 2144 03/20/20 0749  GLUCAP 146* 234* 162* 149* 194*   D-Dimer: No results for input(s): DDIMER in the last 72 hours. Hgb A1c: No results for input(s): HGBA1C in the last 72 hours. Lipid Profile: No results for input(s): CHOL, HDL, LDLCALC, TRIG, CHOLHDL, LDLDIRECT in the last 72 hours. Thyroid function studies: No results for input(s): TSH, T4TOTAL, T3FREE, THYROIDAB in the last 72 hours.  Invalid input(s): FREET3 Anemia work up: No results for input(s): VITAMINB12, FOLATE, FERRITIN, TIBC, IRON, RETICCTPCT in the last 72 hours. Sepsis Labs: Recent Labs  Lab 03/20/20 0141  WBC 10.1    Microbiology No results found for this or any previous visit (from the past 240 hour(s)).  Procedures and diagnostic studies:  No results found.  Signature  Lala Lund M.D on 03/20/2020 at 10:42 AM   -  To page go to [email or web address]

## 2020-03-20 NOTE — TOC Progression Note (Signed)
Transition of Care [facility]) - Progression Note    Patient Details  Name: Susan [name] MRN: [id number] Date of Birth: 11-Nov-1960  Transition [facility] CM/SW [place], [place] Phone Number: 03/20/2020, 9:37 AM  Clinical Narrative:    Pt still has no bed offerings.    Expected Discharge Plan: [place] Barriers to Discharge: Continued Medical Work up  Expected Discharge Plan and Services Expected Discharge Plan: [place] In-house Referral: Clinical Social Work Discharge Planning Services: CM Consult Post Acute Care Choice: [place] arrangements for the past 2 months: Single Family Home                                       Social Determinants of Health (SDOH) Interventions    Readmission Risk Interventions Readmission Risk Prevention Plan 03/11/2020 02/19/2019  Transportation Screening Complete Complete  Medication Review Press photographer) Referral to Pharmacy Complete  PCP or Specialist appointment within 3-5 days of discharge Complete Complete  HRI or Home Care Consult Complete Complete  SW Recovery Care/Counseling Consult Complete Complete  Palliative Care Screening Not Applicable Not [place] Complete Not Applicable  Some recent data might be hidden

## 2020-03-21 LAB — GLUCOSE, CAPILLARY
Glucose-Capillary: 226 mg/dL — ABNORMAL HIGH (ref 70–99)
Glucose-Capillary: 250 mg/dL — ABNORMAL HIGH (ref 70–99)
Glucose-Capillary: 158 mg/dL — ABNORMAL HIGH (ref 70–99)
Glucose-Capillary: 221 mg/dL — ABNORMAL HIGH (ref 70–99)

## 2020-03-21 MED ORDER — LANTUS SOLOSTAR 100 UNIT/ML ~~LOC~~ SOPN: 20.0000 [IU] | PEN_INJECTOR | Freq: Every day | SUBCUTANEOUS | 0 refills | Status: DC

## 2020-03-21 MED ORDER — INSULIN LISPRO 100 UNIT/ML ~~LOC~~ SOLN: SUBCUTANEOUS | 0 refills | Status: DC

## 2020-03-21 MED ORDER — INSULIN LISPRO 100 UNIT/ML ~~LOC~~ SOLN: SUBCUTANEOUS | 3 refills | Status: DC

## 2020-03-21 NOTE — Progress Notes (Signed)
Berniece Salines [place] to be D/C'd Home per MD order.  Discussed with the patient and all questions fully answered.  VSS, Skin clean, dry and intact without evidence of skin break down, no evidence of skin tears noted. IV catheter discontinued intact. Site without signs and symptoms of complications. Dressing and pressure applied.  An After Visit Summary was printed and given to the patient. Patient received prescription.  D/c education completed with patient/family including follow up instructions, medication list, d/c activities limitations if indicated, with other d/c instructions as indicated by MD - patient able to verbalize understanding, all questions fully answered.   Patient instructed to return to ED, call 911, or call MD for any changes in condition.   Patient escorted via [place], and D/C home via private auto.  Jeanella Craze 03/21/2020 6:36 PM

## 2020-03-21 NOTE — Progress Notes (Signed)
Inpatient Diabetes Program Recommendations  AACE/ADA: New Consensus Statement on Inpatient Glycemic Control (2015)  Target Ranges:  Prepandial:   less than 140 mg/dL      Peak postprandial:   less than 180 mg/dL (1-2 hours)      Critically ill patients:  140 - 180 mg/dL   Lab Results  Component Value Date   GLUCAP 250 (H) 03/21/2020   HGBA1C 11.7 (H) 03/08/2020    Review of Glycemic Control Results for Susan [name], Susan [name] (MRN [id number]) as of 03/21/2020 11:16  Ref. Range 03/20/2020 17:39 03/20/2020 21:06 03/21/2020 06:36 03/21/2020 07:39  Glucose-Capillary Latest Ref Range: 70 - 99 mg/dL 223 (H) 205 (H) 226 (H) 250 (H)   Diabetes history:DM Outpatient Diabetes medications:Lantus 30 units qd + Humalog 0-20 tid meal coverage Current orders for Inpatient glycemic control:Lantus 18 units qd +Novolog correction 0-15 units qid + Novolog 4 units TID  Inpatient Diabetes Program Recommendations:    Referral received.    Patient has long-standing DM history.  A1C is actually improved from March 2021.  See's Dr. Adrian Prince for DM management.  Agree with current orders.  D/C plan is for SNF.   Spoke with patient regarding outpatient diabetes management. Patient not engaged in conversation and has decreased attention. Verbalizes self administering insulin prior to admission, but cannot tell me dosages or names of medications. Falling asleep during interaction even while giving cues. Reviewed discharge orders with patient. Patient reports having supplies. Plan appropriate for SNF as I question ability for self management.   Thanks, Bronson Curb, MSN, RNC-OB Diabetes Coordinator [phone] ([phone])

## 2020-03-21 NOTE — Care Management Important Message (Signed)
Important Message  Patient Details  Name: Susan [name] MRN: [id number] Date of Birth: 01-19-1961   Medicare Important Message Given:  Yes - Important Message mailed due to current National Emergency  Verbal consent obtained due to current National Emergency  Relationship to patient: Child Contact Name: Deauna Yaw Call Date: 03/21/20  Time: 1603 Phone: [phone] Outcome: Spoke with contact Important Message mailed to: Patient address on file    Delorse Lek 03/21/2020, 4:04 PM

## 2020-03-21 NOTE — Discharge Summary (Addendum)
Susan [name]:[id number] DOB: Feb 11, 1961 DOA: 03/08/2020  PCP: Hoyt Koch, MD  Admit date: 03/08/2020  Discharge date: 03/21/2020  Admitted From: Home   Disposition:  SNF >> patient and Family decided to go Home   Recommendations for Outpatient Follow-up:   Follow up with PCP in 1-2 weeks  PCP Please obtain BMP/CBC, 2 view CXR in 1week,  (see Discharge instructions)   PCP Please follow up on the following pending results:    Home Health: PT, OT, RN   Equipment/Devices: None  Consultations: Psych, Neuro, PCCM Discharge Condition: Stable    CODE STATUS: Full    Diet Recommendation: Heart Healthy Low Carb    Chief Complaint  Patient presents with  . Altered Mental Status     Brief history of present illness from the day of admission and additional interim summary     Susan [name] is a 59 y.o. female with medical history of HTN, HIV, DM, seizure disorder, bipolar disorder, prolonged hospitalization from 3/10-5/8 for respiratory failure/encephalopathy/septic shock/CVA-required intubation-difficult to wean off ventilator-tracheostomy/PEG tube placement-she was eventually decannulated on 4/28-discharged to SNF-presented to the hospital on 7/6 for altered mental status, hypoglycemia/hypotension-thought to be multifactorial-from unintentional narcotic overdose-she was transferred to the ICU-required Narcan infusion-upon stability-she was transferred to [facility] service. MRI of the brain done this admission was positive for acute/subacute infarct involving the right PCA/right MCA watershed area.   Significant events: 7/6>> presented to [facility] with confusion/hypoglycemia/hypotension found to have AKI-requiring several doses of Narcan-eventually Narcan drip for presumed unintentional narcotic overdose. 7/8>>  transfer to [name] from ICU.  Significant studies: 3/16>> TEE: EF 55-60% 3/23>> daughter Doppler: No significant stenosis. 3/23>> lower extremity Doppler: No DVT. 7/6>> chest x-ray: Low volume chest with indistinct opacity-that could be scarring/active infection 7/6>> CT head: Age-indeterminate right parasagittal occipital infarct with bilateral extension. 7/6>> CT C-spine: No acute abnormality noted in the C-spine 7/6>> renal ultrasound: Negative for obstructive uropathy 7/7>> MRI brain: Acute to subacute infarct involving the right PCA and right MCA/PCA watershed area 7/7>> MRI brain: Stable intracranial MRA since March-with no large vessel occlusion. 7/7>> EEG: No seizures.  Antimicrobial therapy: Unasyn: 7/6>>7/11  Microbiology data: 7/6>> urine culture:<10,000 colonies/mL insignificant growth 7/6>> blood culture: No growth  Procedures : 7/8>> right jugular central line by IR (pulled out by patient later on 7/8 evening)                                                                    Hospital Course    Acute toxic metabolic encephalopathy:Multifactorial-likely narcotic overdose, hypoglycemia, AKI and sepsis physiology all contributing. Mental status improved, currently close to baseline.  Demise narcotic use.  Suspected narcotic overdose:Although urine drug screen negative-she responded well (per PCCM note) to Narcan infusion.  Apparently, patient was started on Nucynta  1 day prior to this hospital stay-(not listed on home med list)-urine drug screen negative as well. Avoid narcotics as much as possible.  Now close to baseline.  Minimize narcotic use.  Hypoglycemia:Suspect likely secondary to poor oral intake, resolved.  Sepsis acute hypoxic respiratory failure likely secondary to aspiration pneumonia:Sepsis physiology has resolved-blood cultures negative-Unasyn completed as off 7/11. Has been titrated off oxygen and stable on room air.  TGP:QDIYME  hemodynamically mediated-creatinine has significantly improved with supportive care. Renal ultrasound was negative for hydronephrosis. Function improved and close to normal.  Repeat BMP in 7 to 10 days and avoid nephrotoxins.  Acute CVA: Apparently had transient left-sided weakness that has resolved. LDL 52, A1c 11.7-had stroke work-up during her most recent admission-neurology recommending aspirin and statin which will be continued.  BRA:XENMMHW CD4 count on 6/2 882-continue antiretrovirals.  Was discharged follow-up with primary ID physician in 3 to 4 weeks.  HTN: Continue antihypertensives, currently on combination of Norvasc and Coreg.  GERD.  On PPI.    History of schizophrenia.  Continue home regimen and follow with psych outpatient.    Weakness and deconditioning.  SNF, PT OT continue at SNF.    DM-2 with hyperglycemia (A1c 11.7 on 7/6): Bad outpatient control due to hyperglycemia, currently on Lantus and sliding scale continue at SNF check CBGs QA CHS monitor and adjust.   Discharge diagnosis     Principal Problem:   Acute encephalopathy Active Problems:   HIV disease ([place])   Schizoaffective disorder, bipolar type ([place])   Morbid obesity ([place])   GERD (gastroesophageal reflux disease)   Diabetes mellitus type 2 without retinopathy ([place])   Hypoglycemia   MDD (major depressive disorder), recurrent episode, moderate ([facility])   Hypotension   AKI (acute kidney injury) ([place])   Schizophrenia [facility])    Discharge instructions    Discharge Instructions    Discharge instructions   Complete by: As directed    Follow with Primary MD Hoyt Koch, MD in 7 days   Get CBC, CMP checked next visit within 1 week by Primary MD or SNF MD    Activity: As tolerated with Full fall precautions use walker/cane & assistance as needed  Disposition SNF  Diet: Heart Healthy Low Carb, check CBGs QA CHS.   Special Instructions: If you have smoked or chewed Tobacco  in the last 2  yrs please stop smoking, stop any regular Alcohol  and or any Recreational drug use.  On your next visit with your primary care physician please Get Medicines reviewed and adjusted.  Please request your Prim.MD to go over all Hospital Tests and Procedure/Radiological results at the follow up, please get all Hospital records sent to your Prim MD by signing hospital release before you go home.  If you experience worsening of your admission symptoms, develop shortness of breath, life threatening emergency, suicidal or homicidal thoughts you must seek medical attention immediately by calling 911 or calling your MD immediately  if symptoms less severe.  You Must read complete instructions/literature along with all the possible adverse reactions/side effects for all the Medicines you take and that have been prescribed to you. Take any new Medicines after you have completely understood and accpet all the possible adverse reactions/side effects.   Increase activity slowly   Complete by: As directed       Discharge Medications   Allergies as of 03/21/2020      Reactions   Lyrica [pregabalin] Swelling   Has LE swelling  Medication List    STOP taking these medications   cloNIDine 0.2 MG tablet Commonly known as: CATAPRES   enalapril 20 MG tablet Commonly known as: VASOTEC   HumaLOG KwikPen 100 UNIT/ML KwikPen Generic drug: insulin lispro Replaced by: insulin lispro 100 UNIT/ML injection   insulin aspart 100 UNIT/ML injection Commonly known as: novoLOG     TAKE these medications   Accu-Chek Aviva Connect w/Device Kit Please use Accu-Chek Aviva to check blood sugars 1-3 times a day   Accu-Chek Aviva Plus test strip Generic drug: glucose blood Use test strips to check blood sugar 1-3 times a day   acetaminophen 325 MG tablet Commonly known as: TYLENOL Take 325-650 mg by mouth every 6 (six) hours as needed for mild pain or headache.   albuterol 108 (90 Base) MCG/ACT  inhaler Commonly known as: VENTOLIN HFA INHALE 2 PUFFS INTO LUNGS EVERY SIX HOURS AS NEEDED FOR WHEEZING OR SHORTNESS OF BREATH What changed: See the new instructions.   amLODipine 10 MG tablet Commonly known as: NORVASC Take 1 tablet (10 mg total) by mouth daily.   Anoro Ellipta 62.5-25 MCG/INH Aepb Generic drug: umeclidinium-vilanterol INHALE 1 PUFF INTO THE LUNGS DAILY. What changed: See the new instructions.   aspirin 81 MG chewable tablet Chew 1 tablet (81 mg total) by mouth daily.   benztropine 0.5 MG tablet Commonly known as: COGENTIN Take 0.5 mg by mouth at bedtime.   Biktarvy 50-200-25 MG Tabs tablet Generic drug: bictegravir-emtricitabine-tenofovir AF Take 1 tablet by mouth daily.   busPIRone 15 MG tablet Commonly known as: BUSPAR Take 15 mg by mouth 3 (three) times daily as needed (for anxiety).   carvedilol 3.125 MG tablet Commonly known as: Coreg Take 1 tablet (3.125 mg total) by mouth 2 (two) times daily.   doxepin 50 MG capsule Commonly known as: SINEQUAN Take 50 mg by mouth at bedtime.   gabapentin 400 MG capsule Commonly known as: NEURONTIN Take 1 capsule (400 mg total) by mouth 3 (three) times daily. What changed: how much to take   hydrOXYzine 25 MG capsule Commonly known as: VISTARIL Take 25 mg by mouth 3 (three) times daily.   insulin lispro 100 UNIT/ML injection Commonly known as: HUMALOG Before each meal 3 times a day, 140-199 - 2 units, 200-250 - 4 units, 251-299 - 6 units,  300-349 - 8 units,  350 or above 10 units. Insulin PEN if approved, provide syringes and needles if needed. Replaces: HumaLOG KwikPen 100 UNIT/ML KwikPen   Lantus SoloStar 100 UNIT/ML Solostar Pen Generic drug: insulin glargine Inject 20 Units into the skin daily. What changed: how much to take   metFORMIN 500 MG tablet Commonly known as: GLUCOPHAGE Take 1,000 mg by mouth 2 (two) times daily as needed (if BGL is 250 or greater).   omeprazole 20 MG  capsule Commonly known as: PRILOSEC TAKE 1 CAPSULE (20 MG TOTAL) BY MOUTH DAILY. What changed: when to take this   Pen Needles 5/16" 30G X 8 MM Misc 1 each by Does not apply route daily.   QUEtiapine 200 MG tablet Commonly known as: SEROQUEL Take 1 tablet (200 mg total) by mouth 2 (two) times daily for 30 days. Please defer to PCP or psychiatrist for refills   rosuvastatin 20 MG tablet Commonly known as: CRESTOR TAKE 1 TABLET BY MOUTH DAILY   sertraline 50 MG tablet Commonly known as: ZOLOFT Take 50 mg by mouth at bedtime.   triamterene-hydrochlorothiazide 37.5-25 MG tablet Commonly known as: MAXZIDE-25 TAKE 1  TABLET BY MOUTH DAILY        Follow-up Information    Hoyt Koch, MD. Schedule an appointment as soon as possible for a visit in 1 week(s).   Specialty: Internal Medicine Contact information: [name] [address] [phone]        Thayer Headings, MD. Schedule an appointment as soon as possible for a visit in 1 month(s).   Specialty: Infectious Diseases Contact information: [address] [address] [phone]        [facility] Follow up.   Specialty: Home Health Services Why: [phone] [place] RN/PT/OT services resumed.               Major procedures and Radiology Reports - PLEASE review detailed and final reports thoroughly  -        CT Cervical Spine Wo Contrast  Result Date: 03/08/2020 CLINICAL DATA:  Trauma. EXAM: CT CERVICAL SPINE WITHOUT CONTRAST TECHNIQUE: Multidetector CT imaging of the cervical spine was performed without intravenous contrast. Multiplanar CT image reconstructions were also generated. COMPARISON:  None. FINDINGS: Alignment: Normal. Skull base and vertebrae: No acute fracture. No primary bone lesion or focal pathologic process. Soft tissues and spinal canal: No prevertebral fluid or swelling. No visible canal hematoma. Disc levels: Mild  degenerative disc disease is noted at C4-5, C5-6 and C6-7. Upper chest: Right upper lobe airspace opacity is noted concerning for possible pneumonia. Other: None. IMPRESSION: 1. Mild multilevel degenerative disc disease. No acute abnormality seen in the cervical spine. 2. Right upper lobe airspace opacity is noted concerning for possible pneumonia. Electronically Signed   By: Marijo Conception M.D.   On: 03/08/2020 12:26   MR ANGIO HEAD WO CONTRAST  Result Date: 03/09/2020 CLINICAL DATA:  59 year old female code stroke presentation yesterday. No IV tPA given. Age indeterminate right occipital infarct on plain head CT. EXAM: MRI HEAD WITHOUT CONTRAST MRA HEAD WITHOUT CONTRAST TECHNIQUE: Multiplanar, multiecho pulse sequences of the brain and surrounding structures were obtained without intravenous contrast. Angiographic images of the head were obtained using MRA technique without contrast. COMPARISON:  Head CT yesterday. Brain MRI and intracranial MRA 11/24/2019. FINDINGS: MRI HEAD FINDINGS Brain: Confluent new 5 cm area of restricted diffusion in the posterior right parietal and superior occipital lobes (series 5, image 8), corresponding to the CT finding yesterday. This involves mostly the right MCA/PCA watershed area. There is a ski me a at the right occipital pole, but the remaining right occipital lobe, and right thalamus are spared. Cytotoxic edema with T2 and FLAIR hyperintensity. There is some T1 hypointensity. Trace petechial hemorrhage (series 9, image 16 - Heidelberg Classification 1a). No significant mass effect. Superimposed developing encephalomalacia in the bilateral posterior MCA territories which were acutely infarcted on 11/24/2019. Mild residual diffusion abnormality bilaterally. Laminar necrosis and trace hemosiderin. No mass effect. No other restricted diffusion. No midline shift, mass effect, evidence of mass lesion, ventriculomegaly, extra-axial collection. Cervicomedullary junction and  pituitary are within normal limits. Signal in the deep gray nuclei, brainstem and cerebellum remains normal. Vascular: Major intracranial vascular flow voids appear stable since March. Skull and upper cervical spine: Negative visible cervical spine. Visualized bone marrow signal is within normal limits. Sinuses/Orbits: Leftward gaze deviation, otherwise negative orbits. Paranasal sinuses are clear. Other: Bilateral mastoid effusions have substantially regressed but not completely resolved. Negative visible nasopharynx; tiny retention cyst near midline on the left. MRA HEAD FINDINGS Mildly degraded by motion artifact today. Antegrade flow  signal in the posterior circulation appears stable since March, with a persistent left side trigeminal artery substantially supplying the basilar artery distal to the patent AICA origins. Dominant distal left vertebral artery. The intervening basilar segment between the a ICAs and the persistent trigeminal has not definitely changed. SCA and PCA origins are patent and appear stable. There is a right side posterior communicating artery redemonstrated. Left PCA branches are stable and within normal limits. Right PCA branches are also largely stable and within normal limits; questionable absence of a very distal branch today when compared to 11/24/2019 MIP images, on series 306, image 17. Although that branch might still be present on the source images. Stable antegrade flow signal in the anterior circulation. Stable left side persistent trigeminal artery origin. Mild anterior genu siphon irregularity bilaterally with associated mild stenosis. Patent carotid termini. Normal right posterior communicating artery origin. MCA and ACA origins remain patent. Diminutive or absent anterior communicating artery. Visible ACA branches are stable and within normal limits. Both MCA bifurcations remain patent without stenosis. And visible bilateral MCA branches appear stable since March, within  normal limits. IMPRESSION: 1. Acute to subacute infarct involving some of the Right PCA and the Right MCA/PCA watershed area as seen by CT yesterday. Trace petechial blood but no malignant hemorrhagic transformation or mass effect. 2. Expected evolution of superimposed confluent bilateral posterior MCA territory infarcts since March. 3. Stable intracranial MRA since March, with no large vessel occlusion. Mild ICA siphon atherosclerosis stenosis and Persistent Left side Trigeminal Artery . Electronically Signed   By: Genevie Ann M.D.   On: 03/09/2020 13:30   MR BRAIN WO CONTRAST  Result Date: 03/09/2020 CLINICAL DATA:  59 year old female code stroke presentation yesterday. No IV tPA given. Age indeterminate right occipital infarct on plain head CT. EXAM: MRI HEAD WITHOUT CONTRAST MRA HEAD WITHOUT CONTRAST TECHNIQUE: Multiplanar, multiecho pulse sequences of the brain and surrounding structures were obtained without intravenous contrast. Angiographic images of the head were obtained using MRA technique without contrast. COMPARISON:  Head CT yesterday. Brain MRI and intracranial MRA 11/24/2019. FINDINGS: MRI HEAD FINDINGS Brain: Confluent new 5 cm area of restricted diffusion in the posterior right parietal and superior occipital lobes (series 5, image 8), corresponding to the CT finding yesterday. This involves mostly the right MCA/PCA watershed area. There is a ski me a at the right occipital pole, but the remaining right occipital lobe, and right thalamus are spared. Cytotoxic edema with T2 and FLAIR hyperintensity. There is some T1 hypointensity. Trace petechial hemorrhage (series 9, image 16 - Heidelberg Classification 1a). No significant mass effect. Superimposed developing encephalomalacia in the bilateral posterior MCA territories which were acutely infarcted on 11/24/2019. Mild residual diffusion abnormality bilaterally. Laminar necrosis and trace hemosiderin. No mass effect. No other restricted diffusion. No  midline shift, mass effect, evidence of mass lesion, ventriculomegaly, extra-axial collection. Cervicomedullary junction and pituitary are within normal limits. Signal in the deep gray nuclei, brainstem and cerebellum remains normal. Vascular: Major intracranial vascular flow voids appear stable since March. Skull and upper cervical spine: Negative visible cervical spine. Visualized bone marrow signal is within normal limits. Sinuses/Orbits: Leftward gaze deviation, otherwise negative orbits. Paranasal sinuses are clear. Other: Bilateral mastoid effusions have substantially regressed but not completely resolved. Negative visible nasopharynx; tiny retention cyst near midline on the left. MRA HEAD FINDINGS Mildly degraded by motion artifact today. Antegrade flow signal in the posterior circulation appears stable since March, with a persistent left side trigeminal artery substantially supplying the basilar artery  distal to the patent AICA origins. Dominant distal left vertebral artery. The intervening basilar segment between the a ICAs and the persistent trigeminal has not definitely changed. SCA and PCA origins are patent and appear stable. There is a right side posterior communicating artery redemonstrated. Left PCA branches are stable and within normal limits. Right PCA branches are also largely stable and within normal limits; questionable absence of a very distal branch today when compared to 11/24/2019 MIP images, on series 306, image 17. Although that branch might still be present on the source images. Stable antegrade flow signal in the anterior circulation. Stable left side persistent trigeminal artery origin. Mild anterior genu siphon irregularity bilaterally with associated mild stenosis. Patent carotid termini. Normal right posterior communicating artery origin. MCA and ACA origins remain patent. Diminutive or absent anterior communicating artery. Visible ACA branches are stable and within normal limits.  Both MCA bifurcations remain patent without stenosis. And visible bilateral MCA branches appear stable since March, within normal limits. IMPRESSION: 1. Acute to subacute infarct involving some of the Right PCA and the Right MCA/PCA watershed area as seen by CT yesterday. Trace petechial blood but no malignant hemorrhagic transformation or mass effect. 2. Expected evolution of superimposed confluent bilateral posterior MCA territory infarcts since March. 3. Stable intracranial MRA since March, with no large vessel occlusion. Mild ICA siphon atherosclerosis stenosis and Persistent Left side Trigeminal Artery . Electronically Signed   By: Genevie Ann M.D.   On: 03/09/2020 13:30   US RENAL  Result Date: 03/08/2020 CLINICAL DATA:  Acute kidney injury EXAM: RENAL / URINARY TRACT ULTRASOUND COMPLETE COMPARISON:  CT 12/03/2019 FINDINGS: Right Kidney: Renal measurements: 10.2 x 4.8 x 4.9 cm = volume: 125 mL. Mildly increased renal cortical echogenicity. No mass, shadowing stone, or hydronephrosis visualized. Left Kidney: Renal measurements: 10.9 x 5.9 x 6.2 cm = volume: 209 mL. Mildly increased renal cortical echogenicity. No mass, shadowing stone, or hydronephrosis visualized. Bladder: Appears normal for degree of bladder distention. Other: None. IMPRESSION: 1. Negative for obstructive uropathy. 2. Mildly increased renal cortical echogenicity bilaterally suggesting sequela of medical renal disease. Electronically Signed   By: Davina Poke D.O.   On: 03/08/2020 15:57   IR Fluoro Guide CV Line Right  Result Date: 03/10/2020 INDICATION: 59 year old with multiple medical problems and poor venous access. Request for central line placement. EXAM: PLACEMENT OF CENTRAL LINE WITH ULTRASOUND AND FLUOROSCOPIC GUIDANCE COMPARISON:  None. MEDICATIONS: None ANESTHESIA/SEDATION: None FLUOROSCOPY TIME:  18 seconds, 1 mGy COMPLICATIONS: None immediate. PROCEDURE: Informed consent was obtained for central line placement. Ultrasound  confirmed a patent right internal jugular vein. Ultrasound image was saved for documentation. Right side of the neck was prepped and draped in sterile fashion. Maximal barrier sterile technique was utilized including caps, mask, sterile gowns, sterile gloves, sterile drape, hand hygiene and skin antiseptic. Skin was anesthetized with 1% lidocaine. Small incision was made. Using ultrasound guidance, 21 gauge needle was directed into the right internal jugular vein and wire was advanced centrally. Peel-away sheath was placed. A dual lumen power PICC line was cut to 15 cm. Catheter was placed through the peel-away sheath. Catheter tip was placed at the SVC and right atrium junction. Both lumens aspirated and flushed well. Both lumens were flushed with saline. Catheter was sutured to skin and a bandage was placed. Fluoroscopic and ultrasound images were taken and saved for documentation. FINDINGS: Catheter tip at the SVC and right atrium junction. IMPRESSION: Placement of right jugular central line with ultrasound and fluoroscopic  guidance. Electronically Signed   By: Markus Daft M.D.   On: 03/10/2020 17:16   IR US Guide Vasc Access Right  Result Date: 03/10/2020 INDICATION: 59 year old with multiple medical problems and poor venous access. Request for central line placement. EXAM: PLACEMENT OF CENTRAL LINE WITH ULTRASOUND AND FLUOROSCOPIC GUIDANCE COMPARISON:  None. MEDICATIONS: None ANESTHESIA/SEDATION: None FLUOROSCOPY TIME:  18 seconds, 1 mGy COMPLICATIONS: None immediate. PROCEDURE: Informed consent was obtained for central line placement. Ultrasound confirmed a patent right internal jugular vein. Ultrasound image was saved for documentation. Right side of the neck was prepped and draped in sterile fashion. Maximal barrier sterile technique was utilized including caps, mask, sterile gowns, sterile gloves, sterile drape, hand hygiene and skin antiseptic. Skin was anesthetized with 1% lidocaine. Small incision was  made. Using ultrasound guidance, 21 gauge needle was directed into the right internal jugular vein and wire was advanced centrally. Peel-away sheath was placed. A dual lumen power PICC line was cut to 15 cm. Catheter was placed through the peel-away sheath. Catheter tip was placed at the SVC and right atrium junction. Both lumens aspirated and flushed well. Both lumens were flushed with saline. Catheter was sutured to skin and a bandage was placed. Fluoroscopic and ultrasound images were taken and saved for documentation. FINDINGS: Catheter tip at the SVC and right atrium junction. IMPRESSION: Placement of right jugular central line with ultrasound and fluoroscopic guidance. Electronically Signed   By: Markus Daft M.D.   On: 03/10/2020 17:16   DG Chest Portable 1 View  Result Date: 03/08/2020 CLINICAL DATA:  Altered mental status EXAM: PORTABLE CHEST 1 VIEW COMPARISON:  12/24/2019 FINDINGS: Low volume chest. Cardiomegaly and vascular pedicle prominence accentuated by low volumes. Diffuse interstitial and airspace opacity. No visible effusion or pneumothorax. IMPRESSION: Low volume chest with indistinct opacity that could be scarring or active infection when compared with recent priors. Electronically Signed   By: Monte Fantasia M.D.   On: 03/08/2020 11:16   EEG adult  Result Date: 03/09/2020 Alexis Goodell, MD     03/09/2020  3:49 PM ELECTROENCEPHALOGRAM REPORT Patient: CYNTHIA COGLE       Room #: [facility] EEG No. ID: [id number] Age: 60 y.o.        Sex: female Requesting Physician: Carson Myrtle Report Date:  03/09/2020       Interpreting Physician: Alexis Goodell History: [place] JON is an 59 y.o. female with altered mental status Medications: Unasyn, ASA, Biktarvy, Neurontin, Sinequan, Seroquel, Crestor Conditions of Recording:  This is a 21 channel routine scalp EEG performed with bipolar and monopolar montages arranged in accordance to the international 10/20 system of electrode placement. One channel was dedicated  to EKG recording. The patient is in the awake, drowsy and asleep states. Description:  Artifact is prominent during the recording often obscuring the background rhythm particularly during wakefulness therefore no waking background activity could be evaluated.  The patient does appear to drowse with a slow background noted consisting of irregular, low voltage delta and theta activity. The patient goes in to a light sleep with symmetrical sleep spindles, vertex central sharp transients and irregular slow activity.  No epileptiform activity is noted.  Hyperventilation was not performed and intermittent photic stimulation were not performed. IMPRESSION: This is a technically difficult electroencephalogram secondary to the predominance of artifact.  Wakefulness could not be evaluated.  Normal drowse and sleep appeared to be present.  No epileptiform activity is noted.  Alexis Goodell, MD Neurology [phone] 03/09/2020, 3:43 PM   CT  HEAD CODE STROKE WO CONTRAST`  Result Date: 03/08/2020 CLINICAL DATA:  Code stroke. EXAM: CT HEAD WITHOUT CONTRAST TECHNIQUE: Contiguous axial images were obtained from the base of the skull through the vertex without intravenous contrast. COMPARISON:  11/24/2019 FINDINGS: Brain: There is no acute intracranial hemorrhage or mass effect. Now chronic bilateral parietal and left temporal infarcts are identified. There is new hypoattenuation with loss of gray-white differentiation in the parasagittal right occipital lobe likely extending into the parietal lobe. Ventricles are stable in size without evidence of hydrocephalus. Vascular: No hyperdense vessel. There is intracranial atherosclerotic calcification at the skull base. Skull: Unremarkable. Sinuses/Orbits: Aerated.  Orbits are unremarkable. Other: Mastoid air cells are clear. IMPRESSION: No acute intracranial hemorrhage or significant mass effect. Age-indeterminate right parasagittal occipital infarct with parietal extension. Chronic  bilateral MCA territory infarcts. These results were communicated to Dr. Leonel Ramsay at 11:30 amon 7/6/2021by text page via the [facility] messaging system. Electronically Signed   By: Macy Mis M.D.   On: 03/08/2020 11:35    Micro Results     No results found for this or any previous visit (from the past 240 hour(s)).  Today   Subjective    Kwynn Schlotter today has no headache,no chest abdominal pain,no new weakness tingling or numbness, feels much better     Objective   Blood pressure (!) 142/77, pulse 91, temperature 98.1 F (36.7 C), temperature source Oral, resp. rate 16, height 5' 3"  (1.6 m), weight 86.5 kg, SpO2 98 %.  No intake or output data in the 24 hours ending 03/21/20 1536  Exam  Awake Alert, No new F.N deficits, Normal affect Zeigler.AT,PERRAL Supple Neck,No JVD, No cervical lymphadenopathy appriciated.  Symmetrical Chest wall movement, Good air movement bilaterally, CTAB RRR,No Gallops,Rubs or new Murmurs, No Parasternal Heave +ve B.Sounds, Abd Soft, Non tender, No organomegaly appriciated, No rebound -guarding or rigidity. No Cyanosis, Clubbing or edema, No new Rash or bruise   Data Review   CBC w Diff:  Lab Results  Component Value Date   WBC 10.1 03/20/2020   HGB 13.2 03/20/2020   HCT 41.7 03/20/2020   PLT 253 03/20/2020   LYMPHOPCT 16 03/08/2020   MONOPCT 6 03/08/2020   EOSPCT 2 03/08/2020   BASOPCT 0 03/08/2020    CMP:  Lab Results  Component Value Date   NA 139 03/20/2020   K 4.2 03/20/2020   CL 104 03/20/2020   CO2 25 03/20/2020   BUN 12 03/20/2020   CREATININE 1.05 (H) 03/20/2020   CREATININE 0.78 06/29/2019   PROT 6.9 03/08/2020   ALBUMIN 3.2 (L) 03/08/2020   BILITOT 0.5 03/08/2020   ALKPHOS 99 03/08/2020   AST 24 03/08/2020   ALT 16 03/08/2020  .   Total Time in preparing paper work, data evaluation and todays exam - 35 minutes  Lala Lund M.D on 03/21/2020 at 3:36 PM  [facility]   Office  [phone]

## 2020-03-21 NOTE — Discharge Instructions (Signed)
Follow with Primary MD Hoyt Koch, MD in 7 days   Get CBC, CMP checked next visit within 1 week by Primary MD or SNF MD    Activity: As tolerated with Full fall precautions use walker/cane & assistance as needed  Disposition HHPT  Diet: Heart Healthy Low Carb, check CBGs QA CHS.   Special Instructions: If you have smoked or chewed Tobacco  in the last 2 yrs please stop smoking, stop any regular Alcohol  and or any Recreational drug use.  On your next visit with your primary care physician please Get Medicines reviewed and adjusted.  Please request your Prim.MD to go over all Hospital Tests and Procedure/Radiological results at the follow up, please get all Hospital records sent to your Prim MD by signing hospital release before you go home.  If you experience worsening of your admission symptoms, develop shortness of breath, life threatening emergency, suicidal or homicidal thoughts you must seek medical attention immediately by calling 911 or calling your MD immediately  if symptoms less severe.  You Must read complete instructions/literature along with all the possible adverse reactions/side effects for all the Medicines you take and that have been prescribed to you. Take any new Medicines after you have completely understood and accpet all the possible adverse reactions/side effects.

## 2020-03-21 NOTE — TOC Transition Note (Signed)
Transition of Care [facility]) - CM/SW Discharge Note   Patient Details  Name: Susan [name] MRN: [id number] Date of Birth: 1960/09/20  [facility]) CM/SW Contact:  Benard Halsted, LCSW Phone Number: 03/21/2020, 3:34 PM   Clinical Narrative:    Patient requesting to return home today and has requested her daughter [name] her up (Still no SNF bed offers). Patient is active with [facility] and made liaison, Butch Penny, aware of discharge plan. CSW requested Home Health RN/PT/OT orders from MD. No other needs identified at this time.   Final next level of care: [place] Barriers to Discharge: Barriers Resolved   Patient Goals and CMS Choice Patient states their goals for this hospitalization and ongoing recovery are:: Rehab CMS Medicare.gov Compare Post Acute Care list provided to:: Patient Represenative (must comment) (Daughter) Choice offered to / list presented to : Adult Children  Discharge Placement                Patient to be transferred to facility by: Car Name of family member notified: Daughter Patient and family notified of of transfer: 03/21/20  Discharge Plan and Services In-house Referral: Clinical Social Work Discharge Planning Services: CM Consult Post Acute Care Choice: [place]                               Social Determinants of Health (SDOH) Interventions     Readmission Risk Interventions Readmission Risk Prevention Plan 03/11/2020 02/19/2019  Transportation Screening Complete Complete  Medication Review [employer]) Referral to Pharmacy Complete  PCP or Specialist appointment within 3-5 days of discharge Complete Complete  HRI or Home Care Consult Complete Complete  SW Recovery Care/Counseling Consult Complete Complete  Palliative Care Screening Not Applicable Not [place] Complete Not Applicable  Some recent data might be hidden

## 2020-03-22 ENCOUNTER — Telehealth: Payer: Self-pay | Admitting: *Deleted

## 2020-03-22 ENCOUNTER — Other Ambulatory Visit: Payer: Self-pay

## 2020-03-22 NOTE — Telephone Encounter (Signed)
Tried calling pt to make hosp f/u appt there was no answer. Could not leave vm due to not having one.Marland KitchenJohny [name]

## 2020-03-22 NOTE — Consult Note (Signed)
   [facility] CM Inpatient Consult   03/22/2020  Susan [name] Sep 14, 1960 [id number]   Patient was extreme high risk and had been recommended for skilled nursing for transition of care from the hospital.  However, the patient had no bed offers and went home with home health. Call attempt was not successful at 11:22 am without success and was unable to leave a message.  Plan:  Will refer to [facility] for Complex disease management for follow up and her primary care provider does the [facility] call and follow up.  Natividad Brood, RN BSN [facility] Liaison  [phone] business mobile phone Toll free office [phone]  Fax number: [phone] [email or web address]

## 2020-03-23 ENCOUNTER — Telehealth: Payer: Self-pay

## 2020-03-23 ENCOUNTER — Other Ambulatory Visit: Payer: Self-pay

## 2020-03-23 DIAGNOSIS — K219 Gastro-esophageal reflux disease without esophagitis: Secondary | ICD-10-CM | POA: Diagnosis not present

## 2020-03-23 DIAGNOSIS — J449 Chronic obstructive pulmonary disease, unspecified: Secondary | ICD-10-CM | POA: Diagnosis not present

## 2020-03-23 DIAGNOSIS — M1990 Unspecified osteoarthritis, unspecified site: Secondary | ICD-10-CM | POA: Diagnosis not present

## 2020-03-23 DIAGNOSIS — E119 Type 2 diabetes mellitus without complications: Secondary | ICD-10-CM | POA: Diagnosis not present

## 2020-03-23 DIAGNOSIS — I509 Heart failure, unspecified: Secondary | ICD-10-CM | POA: Diagnosis not present

## 2020-03-23 DIAGNOSIS — I110 Hypertensive heart disease with heart failure: Secondary | ICD-10-CM | POA: Diagnosis not present

## 2020-03-23 DIAGNOSIS — E785 Hyperlipidemia, unspecified: Secondary | ICD-10-CM | POA: Diagnosis not present

## 2020-03-23 DIAGNOSIS — I69318 Other symptoms and signs involving cognitive functions following cerebral infarction: Secondary | ICD-10-CM | POA: Diagnosis not present

## 2020-03-23 NOTE — Telephone Encounter (Signed)
New message    Requesting nursing orders one time a week for four week  1.Medication Requested:Blood Glucose Monitoring Suppl (ACCU-CHEK AVIVA CONNECT) w/Device KIT    glucose blood (ACCU-CHEK AVIVA PLUS) test strip  2. Pharmacy (Name, Street, City):[facility] [place], [place]  3. On Med List: Yes   4. Last Visit with PCP: 6.28.21   5. Next visit date with PCP: n/a   Agent: Please be advised that RX refills may take up to 3 business days. We ask that you follow-up with your pharmacy.

## 2020-03-23 NOTE — Telephone Encounter (Signed)
Pt needs an OV  

## 2020-03-23 NOTE — Telephone Encounter (Signed)
New message    Need verbal orders for OT Twice a week for 8 weeks .

## 2020-03-24 ENCOUNTER — Other Ambulatory Visit: Payer: Self-pay | Admitting: *Deleted

## 2020-03-24 ENCOUNTER — Other Ambulatory Visit: Payer: Self-pay | Admitting: Internal Medicine

## 2020-03-24 ENCOUNTER — Telehealth: Payer: Self-pay | Admitting: Internal Medicine

## 2020-03-24 DIAGNOSIS — E119 Type 2 diabetes mellitus without complications: Secondary | ICD-10-CM

## 2020-03-24 MED ORDER — ACCU-CHEK AVIVA CONNECT W/DEVICE KIT: PACK | 0 refills | Status: DC

## 2020-03-24 MED ORDER — ACCU-CHEK AVIVA PLUS VI STRP: ORAL_STRIP | 1 refills | Status: DC

## 2020-03-24 NOTE — Patient Outreach (Signed)
[facility]) Care Management  03/24/2020  Susan [name] 05-13-1961 [id number]  Referral received 7/21post hospital d/c Transition of care   RN initial outreach attempt unsuccessful post discharged from the hospital. As instructed attempted to call the pt's daughter Susan [name]). Able to leave a HIPAA approved voice message. Will await a call back to further engage in on pt's needs.  Plan: Will send outreach letter and scheduled another follow next week.  Raina Mina, RN Care Management Coordinator [place] Office [phone]

## 2020-03-24 NOTE — Telephone Encounter (Signed)
New message:   Charlynn Grimes is calling from [facility] and would like verbal orders for nursing visits for 1x a week for 4 weeks. Please advise.

## 2020-03-24 NOTE — Telephone Encounter (Signed)
Tried calling to again still no answer and not able to leave msg due to vm not set-up.Marland KitchenJohny [name]

## 2020-03-24 NOTE — Telephone Encounter (Signed)
I spoke to pt's daughter, [name], stated verbal orders could not be signed off on until pt has been seen per PCP. Pt is scheduled for an ov next week.

## 2020-03-24 NOTE — Telephone Encounter (Signed)
Called pt again still no answer.Marland KitchenJohny [name]

## 2020-03-25 DIAGNOSIS — E119 Type 2 diabetes mellitus without complications: Secondary | ICD-10-CM | POA: Diagnosis not present

## 2020-03-25 DIAGNOSIS — I69318 Other symptoms and signs involving cognitive functions following cerebral infarction: Secondary | ICD-10-CM | POA: Diagnosis not present

## 2020-03-25 DIAGNOSIS — J449 Chronic obstructive pulmonary disease, unspecified: Secondary | ICD-10-CM | POA: Diagnosis not present

## 2020-03-25 DIAGNOSIS — M1990 Unspecified osteoarthritis, unspecified site: Secondary | ICD-10-CM | POA: Diagnosis not present

## 2020-03-25 DIAGNOSIS — I110 Hypertensive heart disease with heart failure: Secondary | ICD-10-CM | POA: Diagnosis not present

## 2020-03-25 DIAGNOSIS — E785 Hyperlipidemia, unspecified: Secondary | ICD-10-CM | POA: Diagnosis not present

## 2020-03-25 DIAGNOSIS — K219 Gastro-esophageal reflux disease without esophagitis: Secondary | ICD-10-CM | POA: Diagnosis not present

## 2020-03-25 DIAGNOSIS — I509 Heart failure, unspecified: Secondary | ICD-10-CM | POA: Diagnosis not present

## 2020-03-28 DIAGNOSIS — I69318 Other symptoms and signs involving cognitive functions following cerebral infarction: Secondary | ICD-10-CM | POA: Diagnosis not present

## 2020-03-28 DIAGNOSIS — I110 Hypertensive heart disease with heart failure: Secondary | ICD-10-CM | POA: Diagnosis not present

## 2020-03-28 DIAGNOSIS — I509 Heart failure, unspecified: Secondary | ICD-10-CM | POA: Diagnosis not present

## 2020-03-28 DIAGNOSIS — E785 Hyperlipidemia, unspecified: Secondary | ICD-10-CM | POA: Diagnosis not present

## 2020-03-28 DIAGNOSIS — E119 Type 2 diabetes mellitus without complications: Secondary | ICD-10-CM | POA: Diagnosis not present

## 2020-03-28 DIAGNOSIS — K219 Gastro-esophageal reflux disease without esophagitis: Secondary | ICD-10-CM | POA: Diagnosis not present

## 2020-03-28 DIAGNOSIS — J449 Chronic obstructive pulmonary disease, unspecified: Secondary | ICD-10-CM | POA: Diagnosis not present

## 2020-03-28 DIAGNOSIS — M1990 Unspecified osteoarthritis, unspecified site: Secondary | ICD-10-CM | POA: Diagnosis not present

## 2020-03-29 ENCOUNTER — Encounter: Payer: Self-pay | Admitting: Internal Medicine

## 2020-03-29 ENCOUNTER — Other Ambulatory Visit: Payer: Self-pay

## 2020-03-29 ENCOUNTER — Ambulatory Visit (INDEPENDENT_AMBULATORY_CARE_PROVIDER_SITE_OTHER): Payer: Medicare Other | Admitting: Internal Medicine

## 2020-03-29 ENCOUNTER — Encounter: Payer: Self-pay | Admitting: *Deleted

## 2020-03-29 ENCOUNTER — Other Ambulatory Visit: Payer: Self-pay | Admitting: *Deleted

## 2020-03-29 VITALS — BP 118/76 | HR 100 | Temp 98.9°F | Ht 63.0 in

## 2020-03-29 DIAGNOSIS — Z8673 Personal history of transient ischemic attack (TIA), and cerebral infarction without residual deficits: Secondary | ICD-10-CM | POA: Diagnosis not present

## 2020-03-29 DIAGNOSIS — B20 Human immunodeficiency virus [HIV] disease: Secondary | ICD-10-CM | POA: Diagnosis not present

## 2020-03-29 DIAGNOSIS — J9612 Chronic respiratory failure with hypercapnia: Secondary | ICD-10-CM

## 2020-03-29 DIAGNOSIS — M62838 Other muscle spasm: Secondary | ICD-10-CM | POA: Diagnosis not present

## 2020-03-29 DIAGNOSIS — E119 Type 2 diabetes mellitus without complications: Secondary | ICD-10-CM | POA: Diagnosis not present

## 2020-03-29 MED ORDER — OMEPRAZOLE 40 MG PO CPDR: 40.0000 mg | DELAYED_RELEASE_CAPSULE | Freq: Every day | ORAL | 3 refills | Status: DC

## 2020-03-29 MED ORDER — BACLOFEN 10 MG PO TABS
10.0000 mg | ORAL_TABLET | Freq: Three times a day (TID) | ORAL | 0 refills | Status: DC
Start: 2020-03-29 — End: 2020-04-11

## 2020-03-29 MED ORDER — TRAMADOL HCL 50 MG PO TABS: 50.0000 mg | ORAL_TABLET | Freq: Three times a day (TID) | ORAL | 0 refills | Status: AC | PRN

## 2020-03-29 NOTE — Progress Notes (Signed)
   Subjective:   Patient ID: Susan [name], female    DOB: 01-Oct-1960, 59 y.o.   MRN: [id number]  HPI  The patient is a 59 YO female coming in for multiple prior hospital stays (for respiratory failure, encephalopathy, with very serious prognosis through 1 of the stays). She is needing home health still as she is not able to get around independently. She also had stroke in hospital and is not recovered from that. She does have concurrent HIV which is under good control. Speech is still an issue, she is not able to do her ADLs independently. Living with daughter currently and they need some help as well would like PCS form filled out. She is doing well with her sugars without lows or highs. Denies chest pains or SOB. Denies cough currently. Having a lot of muscle spasms and pain.  PMH, [facility], social history reviewed and updated  Review of Systems  Constitutional: Positive for activity change, appetite change and fatigue.  HENT: Negative.   Eyes: Negative.   Respiratory: Positive for shortness of breath. Negative for cough and chest tightness.   Cardiovascular: Negative for chest pain, palpitations and leg swelling.  Gastrointestinal: Positive for constipation. Negative for abdominal distention, abdominal pain, anal bleeding, blood in stool, diarrhea, nausea and vomiting.  Musculoskeletal: Positive for arthralgias, gait problem and myalgias.  Skin: Negative.   Neurological: Positive for speech difficulty, weakness and numbness. Negative for dizziness, seizures, light-headedness and headaches.  Psychiatric/Behavioral: Negative.     Objective:  Physical Exam Constitutional:      Appearance: She is well-developed. She is obese.  HENT:     Head: Normocephalic and atraumatic.  Cardiovascular:     Rate and Rhythm: Normal rate and regular rhythm.  Pulmonary:     Effort: Pulmonary effort is normal. No respiratory distress.     Breath sounds: Normal breath sounds. No wheezing or rales.    Abdominal:     General: Bowel sounds are normal. There is no distension.     Palpations: Abdomen is soft.     Tenderness: There is no abdominal tenderness. There is no rebound.  Musculoskeletal:     Cervical back: Normal range of motion.  Skin:    General: Skin is warm and dry.  Neurological:     Mental Status: She is alert and oriented to person, place, and time.     Cranial Nerves: Cranial nerve deficit present.     Motor: Weakness present.     Coordination: Coordination abnormal.     Comments: Wheelchair, speech is not clear, word finding difficulty, some tearfulness during visit     Vitals:   03/29/20 0823  BP: 118/76  Pulse: 100  Temp: 98.9 F (37.2 C)  TempSrc: Oral  SpO2: 96%  Height: 5\' 3"  (1.6 m)    This visit occurred during the SARS-CoV-2 public health emergency.  Safety protocols were in place, including screening questions prior to the visit, additional usage of staff PPE, and extensive cleaning of exam room while observing appropriate contact time as indicated for disinfecting solutions.   Assessment & Plan:
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
## 2020-03-29 NOTE — Patient Instructions (Addendum)
We have sent in baclofen to use as a muscle relaxer for the muscle spasms.   We have sent in tramadol to use for severe pain if needed up to twice a day.   We will get the home health orders approved and the PCS form filled out today.

## 2020-03-30 DIAGNOSIS — J449 Chronic obstructive pulmonary disease, unspecified: Secondary | ICD-10-CM | POA: Diagnosis not present

## 2020-03-30 DIAGNOSIS — K219 Gastro-esophageal reflux disease without esophagitis: Secondary | ICD-10-CM | POA: Diagnosis not present

## 2020-03-30 DIAGNOSIS — M1990 Unspecified osteoarthritis, unspecified site: Secondary | ICD-10-CM | POA: Diagnosis not present

## 2020-03-30 DIAGNOSIS — I110 Hypertensive heart disease with heart failure: Secondary | ICD-10-CM | POA: Diagnosis not present

## 2020-03-30 DIAGNOSIS — E785 Hyperlipidemia, unspecified: Secondary | ICD-10-CM | POA: Diagnosis not present

## 2020-03-30 DIAGNOSIS — I509 Heart failure, unspecified: Secondary | ICD-10-CM | POA: Diagnosis not present

## 2020-03-30 DIAGNOSIS — E119 Type 2 diabetes mellitus without complications: Secondary | ICD-10-CM | POA: Diagnosis not present

## 2020-03-30 DIAGNOSIS — I69318 Other symptoms and signs involving cognitive functions following cerebral infarction: Secondary | ICD-10-CM | POA: Diagnosis not present

## 2020-04-01 ENCOUNTER — Other Ambulatory Visit: Payer: Self-pay

## 2020-04-01 ENCOUNTER — Encounter: Payer: Self-pay | Admitting: Internal Medicine

## 2020-04-01 ENCOUNTER — Encounter (HOSPITAL_COMMUNITY): Payer: Self-pay | Admitting: Emergency Medicine

## 2020-04-01 ENCOUNTER — Inpatient Hospital Stay (HOSPITAL_COMMUNITY)
Admission: EM | Admit: 2020-04-01 | Discharge: 2020-04-03 | DRG: 917 | Disposition: A | Discharge status: Home Health | Payer: Medicare Other | Attending: Internal Medicine | Admitting: Internal Medicine

## 2020-04-01 DIAGNOSIS — Z79899 Other long term (current) drug therapy: Secondary | ICD-10-CM

## 2020-04-01 DIAGNOSIS — T43591A Poisoning by other antipsychotics and neuroleptics, accidental (unintentional), initial encounter: Secondary | ICD-10-CM | POA: Diagnosis not present

## 2020-04-01 DIAGNOSIS — T40421A Poisoning by tramadol, accidental (unintentional), initial encounter: Secondary | ICD-10-CM | POA: Diagnosis not present

## 2020-04-01 DIAGNOSIS — F250 Schizoaffective disorder, bipolar type: Secondary | ICD-10-CM | POA: Diagnosis present

## 2020-04-01 DIAGNOSIS — G9341 Metabolic encephalopathy: Secondary | ICD-10-CM | POA: Diagnosis present

## 2020-04-01 DIAGNOSIS — B20 Human immunodeficiency virus [HIV] disease: Secondary | ICD-10-CM | POA: Diagnosis present

## 2020-04-01 DIAGNOSIS — R404 Transient alteration of awareness: Secondary | ICD-10-CM | POA: Diagnosis not present

## 2020-04-01 DIAGNOSIS — J45909 Unspecified asthma, uncomplicated: Secondary | ICD-10-CM | POA: Diagnosis present

## 2020-04-01 DIAGNOSIS — E1165 Type 2 diabetes mellitus with hyperglycemia: Secondary | ICD-10-CM | POA: Diagnosis present

## 2020-04-01 DIAGNOSIS — Z8673 Personal history of transient ischemic attack (TIA), and cerebral infarction without residual deficits: Secondary | ICD-10-CM

## 2020-04-01 DIAGNOSIS — Z794 Long term (current) use of insulin: Secondary | ICD-10-CM | POA: Diagnosis not present

## 2020-04-01 DIAGNOSIS — Z7189 Other specified counseling: Secondary | ICD-10-CM

## 2020-04-01 DIAGNOSIS — G9340 Encephalopathy, unspecified: Secondary | ICD-10-CM | POA: Diagnosis present

## 2020-04-01 DIAGNOSIS — I10 Essential (primary) hypertension: Secondary | ICD-10-CM | POA: Diagnosis present

## 2020-04-01 DIAGNOSIS — T426X1A Poisoning by other antiepileptic and sedative-hypnotic drugs, accidental (unintentional), initial encounter: Secondary | ICD-10-CM | POA: Diagnosis present

## 2020-04-01 DIAGNOSIS — Z743 Need for continuous supervision: Secondary | ICD-10-CM | POA: Diagnosis not present

## 2020-04-01 DIAGNOSIS — T43011A Poisoning by tricyclic antidepressants, accidental (unintentional), initial encounter: Secondary | ICD-10-CM | POA: Diagnosis present

## 2020-04-01 DIAGNOSIS — E119 Type 2 diabetes mellitus without complications: Secondary | ICD-10-CM

## 2020-04-01 DIAGNOSIS — G40909 Epilepsy, unspecified, not intractable, without status epilepticus: Secondary | ICD-10-CM | POA: Diagnosis present

## 2020-04-01 DIAGNOSIS — M62838 Other muscle spasm: Secondary | ICD-10-CM | POA: Insufficient documentation

## 2020-04-01 DIAGNOSIS — G92 Toxic encephalopathy: Secondary | ICD-10-CM | POA: Diagnosis not present

## 2020-04-01 DIAGNOSIS — E7800 Pure hypercholesterolemia, unspecified: Secondary | ICD-10-CM | POA: Diagnosis not present

## 2020-04-01 DIAGNOSIS — E785 Hyperlipidemia, unspecified: Secondary | ICD-10-CM | POA: Diagnosis present

## 2020-04-01 DIAGNOSIS — F331 Major depressive disorder, recurrent, moderate: Secondary | ICD-10-CM | POA: Diagnosis not present

## 2020-04-01 DIAGNOSIS — R0902 Hypoxemia: Secondary | ICD-10-CM | POA: Diagnosis not present

## 2020-04-01 DIAGNOSIS — Z6833 Body mass index (BMI) 33.0-33.9, adult: Secondary | ICD-10-CM | POA: Diagnosis not present

## 2020-04-01 DIAGNOSIS — Z20822 Contact with and (suspected) exposure to covid-19: Secondary | ICD-10-CM | POA: Diagnosis present

## 2020-04-01 DIAGNOSIS — R4020 Unspecified coma: Secondary | ICD-10-CM | POA: Diagnosis not present

## 2020-04-01 DIAGNOSIS — Z66 Do not resuscitate: Secondary | ICD-10-CM | POA: Diagnosis not present

## 2020-04-01 DIAGNOSIS — T428X1A Poisoning by antiparkinsonism drugs and other central muscle-tone depressants, accidental (unintentional), initial encounter: Principal | ICD-10-CM | POA: Diagnosis present

## 2020-04-01 DIAGNOSIS — R296 Repeated falls: Secondary | ICD-10-CM | POA: Diagnosis not present

## 2020-04-01 DIAGNOSIS — R410 Disorientation, unspecified: Secondary | ICD-10-CM | POA: Diagnosis not present

## 2020-04-01 DIAGNOSIS — K219 Gastro-esophageal reflux disease without esophagitis: Secondary | ICD-10-CM | POA: Diagnosis not present

## 2020-04-01 DIAGNOSIS — G929 Unspecified toxic encephalopathy: Secondary | ICD-10-CM

## 2020-04-01 DIAGNOSIS — Z21 Asymptomatic human immunodeficiency virus [HIV] infection status: Secondary | ICD-10-CM | POA: Diagnosis present

## 2020-04-01 DIAGNOSIS — Z7982 Long term (current) use of aspirin: Secondary | ICD-10-CM

## 2020-04-01 LAB — CBC WITH DIFFERENTIAL/PLATELET
Abs Immature Granulocytes: 0.02 10*3/uL (ref 0.00–0.07)
Basophils Absolute: 0.1 10*3/uL (ref 0.0–0.1)
Basophils Relative: 1 %
Eosinophils Absolute: 0.4 10*3/uL (ref 0.0–0.5)
Eosinophils Relative: 5 %
HCT: 41.1 % (ref 36.0–46.0)
Hemoglobin: 13.3 g/dL (ref 12.0–15.0)
Immature Granulocytes: 0 %
Lymphocytes Relative: 46 %
Lymphs Abs: 4 10*3/uL (ref 0.7–4.0)
MCH: 28.9 pg (ref 26.0–34.0)
MCHC: 32.4 g/dL (ref 30.0–36.0)
MCV: 89.2 fL (ref 80.0–100.0)
Monocytes Absolute: 0.6 10*3/uL (ref 0.1–1.0)
Monocytes Relative: 7 %
Neutro Abs: 3.6 10*3/uL (ref 1.7–7.7)
Neutrophils Relative %: 41 %
Platelets: 234 10*3/uL (ref 150–400)
RBC: 4.61 MIL/uL (ref 3.87–5.11)
RDW: 14.8 % (ref 11.5–15.5)
WBC: 8.6 10*3/uL (ref 4.0–10.5)
nRBC: 0 % (ref 0.0–0.2)

## 2020-04-01 LAB — RAPID URINE DRUG SCREEN, HOSP PERFORMED
Amphetamines: NOT DETECTED
Barbiturates: NOT DETECTED
Benzodiazepines: NOT DETECTED
Cocaine: NOT DETECTED
Opiates: NOT DETECTED
Tetrahydrocannabinol: NOT DETECTED

## 2020-04-01 LAB — BASIC METABOLIC PANEL
Anion gap: 12 (ref 5–15)
BUN: 16 mg/dL (ref 6–20)
CO2: 24 mmol/L (ref 22–32)
Calcium: 8.9 mg/dL (ref 8.9–10.3)
Chloride: 95 mmol/L — ABNORMAL LOW (ref 98–111)
Creatinine, Ser: 1.68 mg/dL — ABNORMAL HIGH (ref 0.44–1.00)
GFR calc Af Amer: 39 mL/min — ABNORMAL LOW (ref >60)
GFR calc non Af Amer: 33 mL/min — ABNORMAL LOW (ref >60)
Glucose, Bld: 216 mg/dL — ABNORMAL HIGH (ref 70–99)
Potassium: 3.3 mmol/L — ABNORMAL LOW (ref 3.5–5.1)
Sodium: 131 mmol/L — ABNORMAL LOW (ref 135–145)

## 2020-04-01 LAB — ETHANOL: Alcohol, Ethyl (B): <10 mg/dL (ref <10)

## 2020-04-01 MED ORDER — SODIUM CHLORIDE 0.9 % IV BOLUS
1000.0000 mL | Freq: Once | INTRAVENOUS | Status: AC
  Administered 2020-04-01: 1000 mL via INTRAVENOUS

## 2020-04-01 MED ORDER — ONDANSETRON HCL 4 MG/2ML IJ SOLN: 4.0000 mg | Freq: Once | INTRAMUSCULAR | Status: DC | PRN

## 2020-04-01 MED ORDER — NALOXONE HCL 0.4 MG/ML IJ SOLN
INTRAMUSCULAR | Status: AC
  Administered 2020-04-01: 0.4 mg via INTRAVENOUS
  Filled 2020-04-01: qty 1

## 2020-04-01 MED ORDER — NALOXONE HCL 0.4 MG/ML IJ SOLN: 0.4000 mg | Freq: Once | INTRAMUSCULAR | Status: AC
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
## 2020-04-01 NOTE — Assessment & Plan Note (Signed)
Rx baclofen and tramadol for pain and spasm to try.

## 2020-04-01 NOTE — ED Triage Notes (Signed)
Pt BIB [facility] from home. Pt found by in home physical therapy with altered level of consciousness. Pt initial GCS of 3 upon EMS arrival, pin point pupils. EMS inserted nasal airway, gave pt 0.5 IM narcan and 1.5 IV narcan. Pt became more alert enroute. Pt alert and maintaining airway upon arrival to department. VSS.

## 2020-04-01 NOTE — Assessment & Plan Note (Signed)
With hypercarbia. Susan [name] is no longer in place and overall stable.

## 2020-04-01 NOTE — Telephone Encounter (Signed)
Verbal orders given to [facility]

## 2020-04-01 NOTE — Assessment & Plan Note (Addendum)
Needs PT/OT/speech. PCS forms filled out and faxed in.

## 2020-04-01 NOTE — Telephone Encounter (Addendum)
Patient came to visit on 03/29/20 and verbal orders for PT/OT/SLP/nursing can be approved retroactive to 30 days prior to visit and ongoing after date of visit.

## 2020-04-01 NOTE — Assessment & Plan Note (Signed)
Overall sugars well controlled currently. Appetite is still poor post stroke.

## 2020-04-01 NOTE — ED Provider Notes (Signed)
  Physical Exam  BP (!) 162/88 (BP Location: Right Arm)   Pulse 68   Temp 97.8 F (36.6 C) (Oral)   Resp 18   Ht 5\' 3"  (1.6 m)   Wt 86.5 kg   LMP  (LMP Unknown)   SpO2 97%   BMI 33.78 kg/m   Physical Exam  ED Course/Procedures   Clinical Course as of Apr 01 2320  Fri Apr 01, 2020  1343 On arrival somnolent but arousable to sternal rub,slurred speech, eyes bloodshot with pinpoint pupils.  Additional 0.4 mg IV narcan ordered.  Patient still drowsy but now can communicate.   [MT]    Clinical Course User Index [MT] Trifan, Carola Rhine, MD    Procedures  MDM   Still somnolent.  Still only responds to sternal rub.  Will admit. Labs are reassuring.      Varney Biles, MD 04/01/20 2322

## 2020-04-01 NOTE — Assessment & Plan Note (Signed)
Stable

## 2020-04-02 ENCOUNTER — Encounter (HOSPITAL_COMMUNITY): Payer: Self-pay | Admitting: Family Medicine

## 2020-04-02 DIAGNOSIS — Z794 Long term (current) use of insulin: Secondary | ICD-10-CM | POA: Diagnosis not present

## 2020-04-02 DIAGNOSIS — Z8673 Personal history of transient ischemic attack (TIA), and cerebral infarction without residual deficits: Secondary | ICD-10-CM | POA: Diagnosis not present

## 2020-04-02 DIAGNOSIS — K219 Gastro-esophageal reflux disease without esophagitis: Secondary | ICD-10-CM | POA: Diagnosis present

## 2020-04-02 DIAGNOSIS — J45909 Unspecified asthma, uncomplicated: Secondary | ICD-10-CM | POA: Diagnosis present

## 2020-04-02 DIAGNOSIS — G9340 Encephalopathy, unspecified: Secondary | ICD-10-CM

## 2020-04-02 DIAGNOSIS — G40909 Epilepsy, unspecified, not intractable, without status epilepticus: Secondary | ICD-10-CM | POA: Diagnosis present

## 2020-04-02 DIAGNOSIS — Z7982 Long term (current) use of aspirin: Secondary | ICD-10-CM | POA: Diagnosis not present

## 2020-04-02 DIAGNOSIS — T426X1A Poisoning by other antiepileptic and sedative-hypnotic drugs, accidental (unintentional), initial encounter: Secondary | ICD-10-CM | POA: Diagnosis present

## 2020-04-02 DIAGNOSIS — I10 Essential (primary) hypertension: Secondary | ICD-10-CM

## 2020-04-02 DIAGNOSIS — F250 Schizoaffective disorder, bipolar type: Secondary | ICD-10-CM | POA: Diagnosis present

## 2020-04-02 DIAGNOSIS — F331 Major depressive disorder, recurrent, moderate: Secondary | ICD-10-CM

## 2020-04-02 DIAGNOSIS — R296 Repeated falls: Secondary | ICD-10-CM | POA: Diagnosis present

## 2020-04-02 DIAGNOSIS — Z6833 Body mass index (BMI) 33.0-33.9, adult: Secondary | ICD-10-CM

## 2020-04-02 DIAGNOSIS — T428X1A Poisoning by antiparkinsonism drugs and other central muscle-tone depressants, accidental (unintentional), initial encounter: Secondary | ICD-10-CM | POA: Diagnosis present

## 2020-04-02 DIAGNOSIS — Z66 Do not resuscitate: Secondary | ICD-10-CM

## 2020-04-02 DIAGNOSIS — B20 Human immunodeficiency virus [HIV] disease: Secondary | ICD-10-CM | POA: Diagnosis not present

## 2020-04-02 DIAGNOSIS — E785 Hyperlipidemia, unspecified: Secondary | ICD-10-CM | POA: Diagnosis present

## 2020-04-02 DIAGNOSIS — E7800 Pure hypercholesterolemia, unspecified: Secondary | ICD-10-CM | POA: Diagnosis present

## 2020-04-02 DIAGNOSIS — Z20822 Contact with and (suspected) exposure to covid-19: Secondary | ICD-10-CM | POA: Diagnosis present

## 2020-04-02 DIAGNOSIS — T43591A Poisoning by other antipsychotics and neuroleptics, accidental (unintentional), initial encounter: Secondary | ICD-10-CM | POA: Diagnosis present

## 2020-04-02 DIAGNOSIS — G9341 Metabolic encephalopathy: Secondary | ICD-10-CM | POA: Diagnosis present

## 2020-04-02 DIAGNOSIS — E119 Type 2 diabetes mellitus without complications: Secondary | ICD-10-CM

## 2020-04-02 DIAGNOSIS — G92 Toxic encephalopathy: Secondary | ICD-10-CM | POA: Diagnosis present

## 2020-04-02 DIAGNOSIS — E1165 Type 2 diabetes mellitus with hyperglycemia: Secondary | ICD-10-CM | POA: Diagnosis present

## 2020-04-02 DIAGNOSIS — Z79899 Other long term (current) drug therapy: Secondary | ICD-10-CM | POA: Diagnosis not present

## 2020-04-02 DIAGNOSIS — Z21 Asymptomatic human immunodeficiency virus [HIV] infection status: Secondary | ICD-10-CM | POA: Diagnosis present

## 2020-04-02 DIAGNOSIS — T40421A Poisoning by tramadol, accidental (unintentional), initial encounter: Secondary | ICD-10-CM | POA: Diagnosis present

## 2020-04-02 DIAGNOSIS — T43011A Poisoning by tricyclic antidepressants, accidental (unintentional), initial encounter: Secondary | ICD-10-CM | POA: Diagnosis present

## 2020-04-02 LAB — GLUCOSE, CAPILLARY
Glucose-Capillary: 92 mg/dL (ref 70–99)
Glucose-Capillary: 170 mg/dL — ABNORMAL HIGH (ref 70–99)
Glucose-Capillary: 204 mg/dL — ABNORMAL HIGH (ref 70–99)
Glucose-Capillary: 176 mg/dL — ABNORMAL HIGH (ref 70–99)

## 2020-04-02 LAB — CBC
HCT: 47.6 % — ABNORMAL HIGH (ref 36.0–46.0)
Hemoglobin: 15.6 g/dL — ABNORMAL HIGH (ref 12.0–15.0)
MCH: 28.4 pg (ref 26.0–34.0)
MCHC: 32.8 g/dL (ref 30.0–36.0)
MCV: 86.7 fL (ref 80.0–100.0)
Platelets: 315 10*3/uL (ref 150–400)
RBC: 5.49 MIL/uL — ABNORMAL HIGH (ref 3.87–5.11)
RDW: 14.7 % (ref 11.5–15.5)
WBC: 7.3 10*3/uL (ref 4.0–10.5)
nRBC: 0 % (ref 0.0–0.2)

## 2020-04-02 LAB — BASIC METABOLIC PANEL
Anion gap: 13 (ref 5–15)
BUN: 11 mg/dL (ref 6–20)
CO2: 25 mmol/L (ref 22–32)
Calcium: 9.4 mg/dL (ref 8.9–10.3)
Chloride: 104 mmol/L (ref 98–111)
Creatinine, Ser: 1.1 mg/dL — ABNORMAL HIGH (ref 0.44–1.00)
GFR calc Af Amer: >60 mL/min (ref >60)
GFR calc non Af Amer: 56 mL/min — ABNORMAL LOW (ref >60)
Glucose, Bld: 79 mg/dL (ref 70–99)
Potassium: 3.3 mmol/L — ABNORMAL LOW (ref 3.5–5.1)
Sodium: 142 mmol/L (ref 135–145)

## 2020-04-02 LAB — CBG MONITORING, ED: Glucose-Capillary: 95 mg/dL (ref 70–99)

## 2020-04-02 LAB — VITAMIN B12: Vitamin B-12: 378 pg/mL (ref 180–914)

## 2020-04-02 LAB — TSH: TSH: 0.33 u[IU]/mL — ABNORMAL LOW (ref 0.350–4.500)

## 2020-04-02 LAB — RPR: RPR Ser Ql: NONREACTIVE

## 2020-04-02 LAB — MRSA PCR SCREENING: MRSA by PCR: NEGATIVE

## 2020-04-02 LAB — SARS CORONAVIRUS 2 BY RT PCR (HOSPITAL ORDER, PERFORMED IN ~~LOC~~ HOSPITAL LAB): SARS Coronavirus 2: NEGATIVE

## 2020-04-02 LAB — AMMONIA: Ammonia: 22 umol/L (ref 9–35)

## 2020-04-02 MED ORDER — BISACODYL 10 MG RE SUPP: 10.0000 mg | Freq: Every day | RECTAL | Status: DC | PRN

## 2020-04-02 MED ORDER — BUSPIRONE HCL 15 MG PO TABS
15.0000 mg | ORAL_TABLET | Freq: Three times a day (TID) | ORAL | Status: DC | PRN
  Administered 2020-04-02: 15 mg via ORAL
  Filled 2020-04-02: qty 1

## 2020-04-02 MED ORDER — INSULIN GLARGINE 100 UNIT/ML ~~LOC~~ SOLN
5.0000 [IU] | Freq: Every day | SUBCUTANEOUS | Status: DC
  Administered 2020-04-02: 5 [IU] via SUBCUTANEOUS
  Filled 2020-04-02 (×3): qty 0.05

## 2020-04-02 MED ORDER — BENZTROPINE MESYLATE 0.5 MG PO TABS
0.5000 mg | ORAL_TABLET | Freq: Every day | ORAL | Status: DC
  Administered 2020-04-02: 0.5 mg via ORAL
  Filled 2020-04-02 (×2): qty 1

## 2020-04-02 MED ORDER — ENOXAPARIN SODIUM 40 MG/0.4ML ~~LOC~~ SOLN
40.0000 mg | Freq: Every day | SUBCUTANEOUS | Status: DC
  Administered 2020-04-02 – 2020-04-03 (×2): 40 mg via SUBCUTANEOUS
  Filled 2020-04-02 (×3): qty 0.4

## 2020-04-02 MED ORDER — METOPROLOL TARTRATE 5 MG/5ML IV SOLN: 5.0000 mg | Freq: Four times a day (QID) | INTRAVENOUS | Status: DC | PRN

## 2020-04-02 MED ORDER — ALBUTEROL SULFATE (2.5 MG/3ML) 0.083% IN NEBU: 2.5000 mg | INHALATION_SOLUTION | Freq: Four times a day (QID) | RESPIRATORY_TRACT | Status: DC | PRN

## 2020-04-02 MED ORDER — INSULIN ASPART 100 UNIT/ML ~~LOC~~ SOLN
0.0000 [IU] | Freq: Three times a day (TID) | SUBCUTANEOUS | Status: DC
  Administered 2020-04-02: 5 [IU] via SUBCUTANEOUS
  Administered 2020-04-02 – 2020-04-03 (×2): 3 [IU] via SUBCUTANEOUS

## 2020-04-02 MED ORDER — ALBUTEROL SULFATE HFA 108 (90 BASE) MCG/ACT IN AERS
2.0000 | INHALATION_SPRAY | Freq: Four times a day (QID) | RESPIRATORY_TRACT | Status: DC | PRN
  Filled 2020-04-02: qty 6.7

## 2020-04-02 MED ORDER — INSULIN ASPART 100 UNIT/ML ~~LOC~~ SOLN
4.0000 [IU] | Freq: Three times a day (TID) | SUBCUTANEOUS | Status: DC
  Administered 2020-04-02 – 2020-04-03 (×4): 4 [IU] via SUBCUTANEOUS

## 2020-04-02 MED ORDER — SERTRALINE HCL 25 MG PO TABS
50.0000 mg | ORAL_TABLET | Freq: Every day | ORAL | Status: DC
  Administered 2020-04-02: 50 mg via ORAL
  Filled 2020-04-02: qty 1
  Filled 2020-04-02: qty 2

## 2020-04-02 MED ORDER — PANTOPRAZOLE SODIUM 40 MG PO TBEC
40.0000 mg | DELAYED_RELEASE_TABLET | Freq: Every day | ORAL | Status: DC
  Administered 2020-04-02 – 2020-04-03 (×2): 40 mg via ORAL
  Filled 2020-04-02 (×2): qty 1

## 2020-04-02 MED ORDER — AMLODIPINE BESYLATE 10 MG PO TABS
10.0000 mg | ORAL_TABLET | Freq: Every day | ORAL | Status: DC
  Administered 2020-04-02 – 2020-04-03 (×2): 10 mg via ORAL
  Filled 2020-04-02 (×2): qty 1

## 2020-04-02 MED ORDER — POTASSIUM CHLORIDE CRYS ER 20 MEQ PO TBCR
20.0000 meq | EXTENDED_RELEASE_TABLET | Freq: Two times a day (BID) | ORAL | Status: DC
  Administered 2020-04-02 – 2020-04-03 (×3): 20 meq via ORAL
  Filled 2020-04-02 (×3): qty 1

## 2020-04-02 MED ORDER — QUETIAPINE FUMARATE 100 MG PO TABS
200.0000 mg | ORAL_TABLET | Freq: Two times a day (BID) | ORAL | Status: DC
  Administered 2020-04-02 – 2020-04-03 (×3): 200 mg via ORAL
  Filled 2020-04-02 (×3): qty 2
  Filled 2020-04-02: qty 1
  Filled 2020-04-02: qty 2

## 2020-04-02 MED ORDER — LACTATED RINGERS IV SOLN
INTRAVENOUS | Status: DC
  Administered 2020-04-02: 1000 mL via INTRAVENOUS

## 2020-04-02 MED ORDER — CARVEDILOL 3.125 MG PO TABS
3.1250 mg | ORAL_TABLET | Freq: Two times a day (BID) | ORAL | Status: DC
  Administered 2020-04-02 – 2020-04-03 (×3): 3.125 mg via ORAL
  Filled 2020-04-02 (×3): qty 1

## 2020-04-02 MED ORDER — UMECLIDINIUM-VILANTEROL 62.5-25 MCG/INH IN AEPB: 1.0000 | INHALATION_SPRAY | Freq: Every day | RESPIRATORY_TRACT | Status: DC | PRN

## 2020-04-02 MED ORDER — BICTEGRAVIR-EMTRICITAB-TENOFOV 50-200-25 MG PO TABS
1.0000 | ORAL_TABLET | Freq: Every day | ORAL | Status: DC
  Administered 2020-04-02 – 2020-04-03 (×2): 1 via ORAL
  Filled 2020-04-02 (×2): qty 1

## 2020-04-02 MED ORDER — TRIAMTERENE-HCTZ 37.5-25 MG PO TABS
1.0000 | ORAL_TABLET | Freq: Every day | ORAL | Status: DC
  Administered 2020-04-02 – 2020-04-03 (×2): 1 via ORAL
  Filled 2020-04-02 (×2): qty 1

## 2020-04-02 NOTE — Progress Notes (Signed)
PROGRESS NOTE    Susan [name]  OHY:[id number] DOB: 07/27/1961 DOA: 04/01/2020 PCP: Hoyt Koch, MD   Brief Narrative:  Susan [name] is a 59 y.o. female with medical history significant of HIV, type 2 diabetes, chronic respiratory failure, seizure disorder, schizoaffective disorder bipolar type, recent CVA, with prolonged hospitalization from 3/10-5/8 for respiratory failure/ encephalopathy/septic shock and she required intubation with difficulty weaning off the vent subsequent tracheostomy and PEG tube placement who was eventually decannulated on 4/28, discharged to the SNF and presented to the hospital on 766 with altered mental status thought to be an unintentional narcotic overdose and was just discharged home on 7/19.  Initial plan had been for discharge to a SNF but the patient and daughter declined that and so she went home.  It appears that she was seen by her primary care physician a few days ago and placed on baclofen and tramadol presumptively for back spasms.  The patient was brought into the ED today after she became somnolent and difficult to arouse.  EMS gave her Narcan which had her wake up a bit but then was only arousable to sternal rub.  The daughter reported that she did take multiple medications around 9 AM which included tramadol, doxepin, gabapentin, baclofen.  The daughter went to take a shower and is unclear how many additional doses of pain medicine she might have taken but feel strongly that she probably took several extra doses. She was monitored for approximately 12 hours in the ED without significant improvement did receive Narcan second time and we are asked to admit until she can be more awake and able to perform her ADLs and monitor her airway.   Assessment & Plan:   Principal Problem:   Acute encephalopathy Active Problems:   HIV disease ([place])   Hypertension   Schizoaffective disorder, bipolar type ([name])   Morbid obesity (HCC)   Dyslipidemia   GERD  (gastroesophageal reflux disease)   Diabetes mellitus type 2 without retinopathy (HCC)   MDD (major depressive disorder), recurrent episode, moderate ([place])   DNR (do not resuscitate) discussion   Recurrent falls   Hx of completed stroke   Encephalopathy   Acute toxic encephalopathy in the setting of polypharmacy, improving -Initially appears to be polypharmacy or overdose, mains somewhat altered from baseline, oriented to person and general place but unable to indicate month year or president.   -Patient has multiple offending medications on her own med rec, appears patient's daughter who lives with her is also on multiple sedative /CNS depressing medications, unclear if patient accidentally took the wrong medications or were given the wrong medications.   -Continue to hold patient's home medications including gabapentin, doxepin, Seroquel, Vistaril.  Hypertension On amlodipine 10 mg daily Coreg 3.25 mg twice daily Maxide 25 /37.5 p.o. daily  Schizoaffective disorder, bipolar type BuSpar 15 mg 3 times daily ordered Cogentin 0.5 mg nightly ordered Seroquel 200 mg twice daily ordered Also on 1200 mg of Neurontin 3 times daily this has been held given her altered mental status.  Dyslipidemia Crestor 20 mg daily  HIV Continue Biktarvy 1 p.o. daily  Diabetes type 2, uncontrolled Lab Results  Component Value Date   HGBA1C 11.7 (H) 03/08/2020  On Metformin and insulin at home Sliding scale insulin until p.o. intake improves then will replace back on home Lantus  Recurrent falls, ambulatory dysfunction Likely multifactorial however polypharmacy certainly would increase her risk of falls PT to follow along  History of stroke Will order PT  may need home PT as well  GERD Protonix daily while hospitalized  Morbid obesity Would benefit from weight loss and healthy lifestyle  DVT prophylaxis: Lovenox SQ Code Status: Full code per last palliative care consult note dated  12/2019 Family Communication: None present  Status is: Transition to inpatient given patient has not resolved from her metabolic encephalopathy continues to have poor p.o. intake and requires ongoing close monitoring in the setting of polypharmacy/possible overdose  Dispo: The patient is from: Home              Anticipated d/c is to: To be determined              Anticipated d/c date is: 24 to 48 hours              Patient currently not medically stable for discharge  Consultants:   Neurology  Procedures:   None planned  Antimicrobials:  None indicated  Subjective: No acute issues or events overnight, patient much more awake this morning but not yet back to baseline, fixated on her home medications, concerned that she was given the wrong medications at home or that she was given incorrect medications in the hospital which we discussed she was not given any of her home medications yet since she was just admitted overnight.  She denies nausea, vomiting, diarrhea, constipation, headache, fevers, chills.  Objective: Vitals:   04/02/20 0400 04/02/20 0531 04/02/20 0813 04/02/20 1120  BP: (!) 146/83 (!) 105/58 109/74 (!) 133/81  Pulse: 61 70 61 70  Resp: (!) 10 18 12 19   Temp: 97.8 F (36.6 C)  98 F (36.7 C) 98 F (36.7 C)  TempSrc: Oral  Oral Oral  SpO2: 98% 97% 99% 97%  Weight:      Height:        Intake/Output Summary (Last 24 hours) at 04/02/2020 1338 Last data filed at 04/02/2020 0830 Gross per 24 hour  Intake 654.92 ml  Output --  Net 654.92 ml   Filed Weights   04/01/20 1331  Weight: 86.5 kg    Examination:  General:  Pleasantly resting in bed, No acute distress.  Alert to person and place only HEENT:  Normocephalic atraumatic.  Sclerae nonicteric, noninjected.  Extraocular movements intact bilaterally. Neck:  Without mass or deformity.  Trachea is midline. Lungs:  Clear to auscultate bilaterally without rhonchi, wheeze, or rales. Heart:  Regular rate and  rhythm.  Without murmurs, rubs, or gallops. Abdomen:  Soft, nontender, nondistended.  Without guarding or rebound. Extremities: Without cyanosis, clubbing, edema, or obvious deformity. Vascular:  Dorsalis pedis and posterior tibial pulses palpable bilaterally. Skin:  Warm and dry, no erythema, no ulcerations.  Data Reviewed: I have personally reviewed following labs and imaging studies  CBC: Recent Labs  Lab 04/01/20 1344 04/02/20 0415  WBC 8.6 7.3  NEUTROABS 3.6  --   HGB 13.3 15.6*  HCT 41.1 47.6*  MCV 89.2 86.7  PLT 234 397   Basic Metabolic Panel: Recent Labs  Lab 04/01/20 1344 04/02/20 0415  NA 131* 142  K 3.3* 3.3*  CL 95* 104  CO2 24 25  GLUCOSE 216* 79  BUN 16 11  CREATININE 1.68* 1.10*  CALCIUM 8.9 9.4   GFR: Estimated Creatinine Clearance: 58.8 mL/min (A) (by C-G formula based on SCr of 1.1 mg/dL (H)). Liver Function Tests: No results for input(s): AST, ALT, ALKPHOS, BILITOT, PROT, ALBUMIN in the last 168 hours. No results for input(s): LIPASE, AMYLASE in the last 168 hours. Recent  Labs  Lab 04/02/20 0415  AMMONIA 22   Coagulation Profile: No results for input(s): INR, PROTIME in the last 168 hours. Cardiac Enzymes: No results for input(s): CKTOTAL, CKMB, CKMBINDEX, TROPONINI in the last 168 hours. BNP (last 3 results) No results for input(s): PROBNP in the last 8760 hours. HbA1C: No results for input(s): HGBA1C in the last 72 hours. CBG: Recent Labs  Lab 04/02/20 0327 04/02/20 0625 04/02/20 1121  GLUCAP 95 92 170*   Lipid Profile: No results for input(s): CHOL, HDL, LDLCALC, TRIG, CHOLHDL, LDLDIRECT in the last 72 hours. Thyroid Function Tests: Recent Labs    04/02/20 0415  TSH 0.330*   Anemia Panel: Recent Labs    04/02/20 0415  VITAMINB12 378   Sepsis Labs: No results for input(s): PROCALCITON, LATICACIDVEN in the last 168 hours.  Recent Results (from the past 240 hour(s))  SARS Coronavirus 2 by RT PCR (hospital order,  performed in [facility] hospital lab) Nasopharyngeal Nasopharyngeal Swab     Status: None   Collection Time: 04/02/20  2:27 AM   Specimen: Nasopharyngeal Swab  Result Value Ref Range Status   SARS Coronavirus 2 NEGATIVE NEGATIVE Final    Comment: (NOTE) SARS-CoV-2 target nucleic acids are NOT DETECTED.  The SARS-CoV-2 RNA is generally detectable in upper and lower respiratory specimens during the acute phase of infection. The lowest concentration of SARS-CoV-2 viral copies this assay can detect is 250 copies / mL. A negative result does not preclude SARS-CoV-2 infection and should not be used as the sole basis for treatment or other patient management decisions.  A negative result may occur with improper specimen collection / handling, submission of specimen other than nasopharyngeal swab, presence of viral mutation(s) within the areas targeted by this assay, and inadequate number of viral copies (<250 copies / mL). A negative result must be combined with clinical observations, patient history, and epidemiological information.  Fact Sheet for Patients:   StrictlyIdeas.no  Fact Sheet for Healthcare Providers: BankingDealers.co.za  This test is not yet approved or  cleared by the [place] FDA and has been authorized for detection and/or diagnosis of SARS-CoV-2 by FDA under an Emergency Use Authorization (EUA).  This EUA will remain in effect (meaning this test can be used) for the duration of the COVID-19 declaration under Section 564(b)(1) of the Act, 21 U.S.C. section 360bbb-3(b)(1), unless the authorization is terminated or revoked sooner.  Performed at [facility], [place] [address], [place], [place] [address]   MRSA PCR Screening     Status: None   Collection Time: 04/02/20  3:55 AM   Specimen: Nasal Mucosa; Nasopharyngeal  Result Value Ref Range Status   MRSA by PCR NEGATIVE NEGATIVE Final    Comment:        The  GeneXpert MRSA Assay (FDA approved for NASAL specimens only), is one component of a comprehensive MRSA colonization surveillance program. It is not intended to diagnose MRSA infection nor to guide or monitor treatment for MRSA infections. Performed at [facility], [place] [address] [place], [place] [address]      Radiology Studies: No results found.  Scheduled Meds: . amLODipine  10 mg Oral Daily  . benztropine  0.5 mg Oral QHS  . bictegravir-emtricitabine-tenofovir AF  1 tablet Oral Daily  . carvedilol  3.125 mg Oral BID  . enoxaparin (LOVENOX) injection  40 mg Subcutaneous Daily  . insulin aspart  0-15 Units Subcutaneous TID WC  . insulin aspart  4 Units Subcutaneous TID  WC  . insulin glargine  5 Units Subcutaneous QHS  . pantoprazole  40 mg Oral Daily  . potassium chloride  20 mEq Oral BID  . QUEtiapine  200 mg Oral BID  . sertraline  50 mg Oral QHS  . triamterene-hydrochlorothiazide  1 tablet Oral Daily   Continuous Infusions: . lactated ringers Stopped (04/02/20 1135)     LOS: 0 days   Time spent: 40 min  Little Ishikawa, DO [facility]  If 7PM-7AM, please contact night-coverage [email or web address]  04/02/2020, 1:38 PM

## 2020-04-02 NOTE — ED Notes (Signed)
Phlebotomy unable to obtain blood for labs x2. Patient's IV will not draw back.
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
## 2020-04-03 LAB — COMPREHENSIVE METABOLIC PANEL
ALT: 27 U/L (ref 0–44)
AST: 28 U/L (ref 15–41)
Albumin: 3 g/dL — ABNORMAL LOW (ref 3.5–5.0)
Alkaline Phosphatase: 87 U/L (ref 38–126)
Anion gap: 9 (ref 5–15)
BUN: 11 mg/dL (ref 6–20)
CO2: 26 mmol/L (ref 22–32)
Calcium: 9 mg/dL (ref 8.9–10.3)
Chloride: 103 mmol/L (ref 98–111)
Creatinine, Ser: 1.11 mg/dL — ABNORMAL HIGH (ref 0.44–1.00)
GFR calc Af Amer: >60 mL/min (ref >60)
GFR calc non Af Amer: 55 mL/min — ABNORMAL LOW (ref >60)
Glucose, Bld: 189 mg/dL — ABNORMAL HIGH (ref 70–99)
Potassium: 3.8 mmol/L (ref 3.5–5.1)
Sodium: 138 mmol/L (ref 135–145)
Total Bilirubin: 0.3 mg/dL (ref 0.3–1.2)
Total Protein: 6.2 g/dL — ABNORMAL LOW (ref 6.5–8.1)

## 2020-04-03 LAB — CBC
HCT: 39.8 % (ref 36.0–46.0)
Hemoglobin: 12.7 g/dL (ref 12.0–15.0)
MCH: 27.9 pg (ref 26.0–34.0)
MCHC: 31.9 g/dL (ref 30.0–36.0)
MCV: 87.3 fL (ref 80.0–100.0)
Platelets: 284 10*3/uL (ref 150–400)
RBC: 4.56 MIL/uL (ref 3.87–5.11)
RDW: 15 % (ref 11.5–15.5)
WBC: 6.8 10*3/uL (ref 4.0–10.5)
nRBC: 0 % (ref 0.0–0.2)

## 2020-04-03 LAB — GLUCOSE, CAPILLARY
Glucose-Capillary: 193 mg/dL — ABNORMAL HIGH (ref 70–99)
Glucose-Capillary: 108 mg/dL — ABNORMAL HIGH (ref 70–99)

## 2020-04-03 MED ORDER — GERHARDT'S BUTT CREAM
TOPICAL_CREAM | Freq: Four times a day (QID) | CUTANEOUS | Status: DC
  Filled 2020-04-03: qty 1

## 2020-04-03 MED ORDER — GABAPENTIN 100 MG PO CAPS
100.0000 mg | ORAL_CAPSULE | Freq: Three times a day (TID) | ORAL | 0 refills | Status: DC
Start: 2020-04-03 — End: 2020-08-17

## 2020-04-03 MED ORDER — GABAPENTIN 100 MG PO CAPS: 200.0000 mg | ORAL_CAPSULE | Freq: Three times a day (TID) | ORAL | Status: DC

## 2020-04-03 NOTE — Progress Notes (Signed)
Patient IV discontinued and assisted with getting dressed.  Daughter [name] called and informed her mother is ready to go home and the writer will go over discharge instructions with her on arrival.  Patient in the bed fully dressed waiting.  Daughter said she would be here in 1 hour

## 2020-04-03 NOTE — Discharge Summary (Signed)
Physician Discharge Summary  Susan [name] ZHY:[id number] DOB: Mar 28, 1961 DOA: 04/01/2020  PCP: Susan Koch, MD  Admit date: 04/01/2020 Discharge date: 04/03/2020  Admitted From: Home Disposition: Home  Recommendations for Outpatient Follow-up:  1. Follow up with PCP in 1-2 weeks 2. Please obtain BMP/CBC in one week  Discharge Condition: Stable CODE STATUS: Full Diet recommendation: As tolerated  Brief/Interim Summary: Susan [name] a 59 y.o.femalewith medical history significant ofHIV, type 2 diabetes, chronic respiratory failure, seizure disorder, schizoaffective disorder bipolar type, recent CVA, with prolonged hospitalization from 3/10-5/8 for respiratory failure/encephalopathy/septic shock andshe required intubation with difficulty weaning off the vent subsequent tracheostomy and PEG tube placement who was eventually decannulated on 4/28, discharged to the SNF and presented to the hospital on 766 with altered mental status thought to be an unintentional narcotic overdoseandwas just discharged home on 7/19.Initial plan had been for discharge to a SNF but the patient and daughter declined that and so she went home. It appears that she was seen by her primary care physician a few days ago and placed on baclofen and tramadol presumptively for back spasms. The patient was brought into the ED today after she became somnolent and difficult to arouse. EMS gave her Narcan which had her wake up a bit but then was only arousable to sternal rub. The daughter reported that she did take multiple medications around 9 AM which included tramadol, doxepin, gabapentin, baclofen. The daughter went to take a shower and is unclear how many additional doses of pain medicine she might have taken but feel strongly that she probably took several extra doses. She was monitored for approximately 12 hours in the ED without significant improvement did receive Narcan second time and we are asked to  admit until she can be more awake and able to perform her ADLs and monitor her airway.  Patient met as above with acute metabolic encephalopathy likely in the setting of polypharmacy and incidental overdose. Patient was monitored for 48 hours with resolution of mental status now back to baseline, tolerating p.o. well. We discussed with daughter, there appears to be some miscommunication or confusion about patient's gabapentin dosing which is likely the culprit medication, we have decreased her gabapentin dose, would recommend following with PCP closely in the next 1 to 2 weeks and ultimately discontinued gabapentin as well as have further discussion about need for decreasing or discontinuing other medications given high risk for polypharmacy given her multiple medications. Again patient otherwise awake alert oriented to baseline, family agreeable patient is otherwise stable for discharge home.[name]  Discharge Diagnoses:  Principal Problem:   Acute encephalopathy Active Problems:   HIV disease ([place])   Hypertension   Schizoaffective disorder, bipolar type ([place])   Morbid obesity ([place])   Dyslipidemia   GERD (gastroesophageal reflux disease)   Diabetes mellitus type 2 without retinopathy ([facility])   MDD (major depressive disorder), recurrent episode, moderate ([place])   DNR (do not resuscitate) discussion   Recurrent falls   Hx of completed stroke   Encephalopathy   Acute metabolic encephalopathy    Discharge Instructions  Discharge Instructions    Call MD for:  difficulty breathing, headache or visual disturbances   Complete by: As directed    Call MD for:  extreme fatigue   Complete by: As directed    Call MD for:  hives   Complete by: As directed    Call MD for:  persistant dizziness or light-headedness   Complete by: As directed    Call  MD for:  persistant nausea and vomiting   Complete by: As directed    Call MD for:  severe uncontrolled pain   Complete by: As directed    Call MD for:   temperature >100.4   Complete by: As directed    Diet - low sodium heart healthy   Complete by: As directed    Increase activity slowly   Complete by: As directed    Increase activity slowly   Complete by: As directed      Allergies as of 04/03/2020      Reactions   Lyrica [pregabalin] Swelling   Has LE swelling      Medication List    TAKE these medications   Accu-Chek Aviva Connect w/Device Kit Please use Accu-Chek Aviva to check blood sugars 1-3 times a day   Accu-Chek Aviva Plus test strip Generic drug: glucose blood USE AS DIRECTED 1-3 TIMES DAILY.   acetaminophen 325 MG tablet Commonly known as: TYLENOL Take 325-650 mg by mouth every 6 (six) hours as needed for mild pain or headache.   albuterol 108 (90 Base) MCG/ACT inhaler Commonly known as: VENTOLIN HFA INHALE 2 PUFFS INTO LUNGS EVERY SIX HOURS AS NEEDED FOR WHEEZING OR SHORTNESS OF BREATH What changed: See the new instructions.   amLODipine 10 MG tablet Commonly known as: NORVASC Take 1 tablet (10 mg total) by mouth daily.   Anoro Ellipta 62.5-25 MCG/INH Aepb Generic drug: umeclidinium-vilanterol INHALE 1 PUFF INTO THE LUNGS DAILY. What changed: See the new instructions.   aspirin 81 MG chewable tablet Chew 1 tablet (81 mg total) by mouth daily.   baclofen 10 MG tablet Commonly known as: LIORESAL Take 1 tablet (10 mg total) by mouth 3 (three) times daily.   benztropine 0.5 MG tablet Commonly known as: COGENTIN Take 0.5 mg by mouth at bedtime.   Biktarvy 50-200-25 MG Tabs tablet Generic drug: bictegravir-emtricitabine-tenofovir AF Take 1 tablet by mouth daily.   busPIRone 15 MG tablet Commonly known as: BUSPAR Take 15 mg by mouth 3 (three) times daily as needed (for anxiety).   carvedilol 3.125 MG tablet Commonly known as: Coreg Take 1 tablet (3.125 mg total) by mouth 2 (two) times daily.   doxepin 50 MG capsule Commonly known as: SINEQUAN Take 50 mg by mouth at bedtime.   gabapentin 100  MG capsule Commonly known as: NEURONTIN Take 2 capsules (200 mg total) by mouth 3 (three) times daily. What changed:   medication strength  how much to take   hydrOXYzine 25 MG capsule Commonly known as: VISTARIL Take 25 mg by mouth 3 (three) times daily.   insulin lispro 100 UNIT/ML injection Commonly known as: HUMALOG Before each meal 3 times a day, 140-199 - 2 units, 200-250 - 4 units, 251-299 - 6 units,  300-349 - 8 units,  350 or above 10 units. Insulin PEN if approved, provide syringes and needles if needed.   Lantus SoloStar 100 UNIT/ML Solostar Pen Generic drug: insulin glargine Inject 20 Units into the skin daily. What changed: when to take this   metFORMIN 500 MG tablet Commonly known as: GLUCOPHAGE Take 1,000 mg by mouth 2 (two) times daily as needed (if BGL is 250 or greater).   omeprazole 40 MG capsule Commonly known as: PRILOSEC Take 1 capsule (40 mg total) by mouth daily.   Pen Needles 5/16" 30G X 8 MM Misc 1 each by Does not apply route daily.   QUEtiapine 200 MG tablet Commonly known as: SEROQUEL Take 1  tablet (200 mg total) by mouth 2 (two) times daily for 30 days. Please defer to PCP or psychiatrist for refills   rosuvastatin 20 MG tablet Commonly known as: CRESTOR TAKE 1 TABLET BY MOUTH DAILY   sertraline 50 MG tablet Commonly known as: ZOLOFT Take 50 mg by mouth at bedtime.   traMADol 50 MG tablet Commonly known as: ULTRAM Take 1 tablet (50 mg total) by mouth every 8 (eight) hours as needed for up to 5 days.   triamterene-hydrochlorothiazide 37.5-25 MG tablet Commonly known as: MAXZIDE-25 TAKE 1 TABLET BY MOUTH DAILY       Follow-up Information    [facility]   Specialty: Home Health Services             Allergies  Allergen Reactions  . Lyrica [Pregabalin] Swelling    Has LE swelling    Consultations:  None   Procedures/Studies: CT Cervical Spine Wo Contrast  Result Date:  03/08/2020 CLINICAL DATA:  Trauma. EXAM: CT CERVICAL SPINE WITHOUT CONTRAST TECHNIQUE: Multidetector CT imaging of the cervical spine was performed without intravenous contrast. Multiplanar CT image reconstructions were also generated. COMPARISON:  None. FINDINGS: Alignment: Normal. Skull base and vertebrae: No acute fracture. No primary bone lesion or focal pathologic process. Soft tissues and spinal canal: No prevertebral fluid or swelling. No visible canal hematoma. Disc levels: Mild degenerative disc disease is noted at C4-5, C5-6 and C6-7. Upper chest: Right upper lobe airspace opacity is noted concerning for possible pneumonia. Other: None. IMPRESSION: 1. Mild multilevel degenerative disc disease. No acute abnormality seen in the cervical spine. 2. Right upper lobe airspace opacity is noted concerning for possible pneumonia. Electronically Signed   By: Marijo Conception M.D.   On: 03/08/2020 12:26   MR ANGIO HEAD WO CONTRAST  Result Date: 03/09/2020 CLINICAL DATA:  59 year old female code stroke presentation yesterday. No IV tPA given. Age indeterminate right occipital infarct on plain head CT. EXAM: MRI HEAD WITHOUT CONTRAST MRA HEAD WITHOUT CONTRAST TECHNIQUE: Multiplanar, multiecho pulse sequences of the brain and surrounding structures were obtained without intravenous contrast. Angiographic images of the head were obtained using MRA technique without contrast. COMPARISON:  Head CT yesterday. Brain MRI and intracranial MRA 11/24/2019. FINDINGS: MRI HEAD FINDINGS Brain: Confluent new 5 cm area of restricted diffusion in the posterior right parietal and superior occipital lobes (series 5, image 8), corresponding to the CT finding yesterday. This involves mostly the right MCA/PCA watershed area. There is a ski me a at the right occipital pole, but the remaining right occipital lobe, and right thalamus are spared. Cytotoxic edema with T2 and FLAIR hyperintensity. There is some T1 hypointensity. Trace petechial  hemorrhage (series 9, image 16 - Heidelberg Classification 1a). No significant mass effect. Superimposed developing encephalomalacia in the bilateral posterior MCA territories which were acutely infarcted on 11/24/2019. Mild residual diffusion abnormality bilaterally. Laminar necrosis and trace hemosiderin. No mass effect. No other restricted diffusion. No midline shift, mass effect, evidence of mass lesion, ventriculomegaly, extra-axial collection. Cervicomedullary junction and pituitary are within normal limits. Signal in the deep gray nuclei, brainstem and cerebellum remains normal. Vascular: Major intracranial vascular flow voids appear stable since March. Skull and upper cervical spine: Negative visible cervical spine. Visualized bone marrow signal is within normal limits. Sinuses/Orbits: Leftward gaze deviation, otherwise negative orbits. Paranasal sinuses are clear. Other: Bilateral mastoid effusions have substantially regressed but not completely resolved. Negative visible nasopharynx; tiny retention cyst near midline on the left. MRA HEAD FINDINGS  Mildly degraded by motion artifact today. Antegrade flow signal in the posterior circulation appears stable since March, with a persistent left side trigeminal artery substantially supplying the basilar artery distal to the patent AICA origins. Dominant distal left vertebral artery. The intervening basilar segment between the a ICAs and the persistent trigeminal has not definitely changed. SCA and PCA origins are patent and appear stable. There is a right side posterior communicating artery redemonstrated. Left PCA branches are stable and within normal limits. Right PCA branches are also largely stable and within normal limits; questionable absence of a very distal branch today when compared to 11/24/2019 MIP images, on series 306, image 17. Although that branch might still be present on the source images. Stable antegrade flow signal in the anterior circulation.  Stable left side persistent trigeminal artery origin. Mild anterior genu siphon irregularity bilaterally with associated mild stenosis. Patent carotid termini. Normal right posterior communicating artery origin. MCA and ACA origins remain patent. Diminutive or absent anterior communicating artery. Visible ACA branches are stable and within normal limits. Both MCA bifurcations remain patent without stenosis. And visible bilateral MCA branches appear stable since March, within normal limits. IMPRESSION: 1. Acute to subacute infarct involving some of the Right PCA and the Right MCA/PCA watershed area as seen by CT yesterday. Trace petechial blood but no malignant hemorrhagic transformation or mass effect. 2. Expected evolution of superimposed confluent bilateral posterior MCA territory infarcts since March. 3. Stable intracranial MRA since March, with no large vessel occlusion. Mild ICA siphon atherosclerosis stenosis and Persistent Left side Trigeminal Artery . Electronically Signed   By: Genevie Ann M.D.   On: 03/09/2020 13:30   MR BRAIN WO CONTRAST  Result Date: 03/09/2020 CLINICAL DATA:  59 year old female code stroke presentation yesterday. No IV tPA given. Age indeterminate right occipital infarct on plain head CT. EXAM: MRI HEAD WITHOUT CONTRAST MRA HEAD WITHOUT CONTRAST TECHNIQUE: Multiplanar, multiecho pulse sequences of the brain and surrounding structures were obtained without intravenous contrast. Angiographic images of the head were obtained using MRA technique without contrast. COMPARISON:  Head CT yesterday. Brain MRI and intracranial MRA 11/24/2019. FINDINGS: MRI HEAD FINDINGS Brain: Confluent new 5 cm area of restricted diffusion in the posterior right parietal and superior occipital lobes (series 5, image 8), corresponding to the CT finding yesterday. This involves mostly the right MCA/PCA watershed area. There is a ski me a at the right occipital pole, but the remaining right occipital lobe, and right  thalamus are spared. Cytotoxic edema with T2 and FLAIR hyperintensity. There is some T1 hypointensity. Trace petechial hemorrhage (series 9, image 16 - Heidelberg Classification 1a). No significant mass effect. Superimposed developing encephalomalacia in the bilateral posterior MCA territories which were acutely infarcted on 11/24/2019. Mild residual diffusion abnormality bilaterally. Laminar necrosis and trace hemosiderin. No mass effect. No other restricted diffusion. No midline shift, mass effect, evidence of mass lesion, ventriculomegaly, extra-axial collection. Cervicomedullary junction and pituitary are within normal limits. Signal in the deep gray nuclei, brainstem and cerebellum remains normal. Vascular: Major intracranial vascular flow voids appear stable since March. Skull and upper cervical spine: Negative visible cervical spine. Visualized bone marrow signal is within normal limits. Sinuses/Orbits: Leftward gaze deviation, otherwise negative orbits. Paranasal sinuses are clear. Other: Bilateral mastoid effusions have substantially regressed but not completely resolved. Negative visible nasopharynx; tiny retention cyst near midline on the left. MRA HEAD FINDINGS Mildly degraded by motion artifact today. Antegrade flow signal in the posterior circulation appears stable since March, with a persistent left  side trigeminal artery substantially supplying the basilar artery distal to the patent AICA origins. Dominant distal left vertebral artery. The intervening basilar segment between the a ICAs and the persistent trigeminal has not definitely changed. SCA and PCA origins are patent and appear stable. There is a right side posterior communicating artery redemonstrated. Left PCA branches are stable and within normal limits. Right PCA branches are also largely stable and within normal limits; questionable absence of a very distal branch today when compared to 11/24/2019 MIP images, on series 306, image 17.  Although that branch might still be present on the source images. Stable antegrade flow signal in the anterior circulation. Stable left side persistent trigeminal artery origin. Mild anterior genu siphon irregularity bilaterally with associated mild stenosis. Patent carotid termini. Normal right posterior communicating artery origin. MCA and ACA origins remain patent. Diminutive or absent anterior communicating artery. Visible ACA branches are stable and within normal limits. Both MCA bifurcations remain patent without stenosis. And visible bilateral MCA branches appear stable since March, within normal limits. IMPRESSION: 1. Acute to subacute infarct involving some of the Right PCA and the Right MCA/PCA watershed area as seen by CT yesterday. Trace petechial blood but no malignant hemorrhagic transformation or mass effect. 2. Expected evolution of superimposed confluent bilateral posterior MCA territory infarcts since March. 3. Stable intracranial MRA since March, with no large vessel occlusion. Mild ICA siphon atherosclerosis stenosis and Persistent Left side Trigeminal Artery . Electronically Signed   By: Genevie Ann M.D.   On: 03/09/2020 13:30   US RENAL  Result Date: 03/08/2020 CLINICAL DATA:  Acute kidney injury EXAM: RENAL / URINARY TRACT ULTRASOUND COMPLETE COMPARISON:  CT 12/03/2019 FINDINGS: Right Kidney: Renal measurements: 10.2 x 4.8 x 4.9 cm = volume: 125 mL. Mildly increased renal cortical echogenicity. No mass, shadowing stone, or hydronephrosis visualized. Left Kidney: Renal measurements: 10.9 x 5.9 x 6.2 cm = volume: 209 mL. Mildly increased renal cortical echogenicity. No mass, shadowing stone, or hydronephrosis visualized. Bladder: Appears normal for degree of bladder distention. Other: None. IMPRESSION: 1. Negative for obstructive uropathy. 2. Mildly increased renal cortical echogenicity bilaterally suggesting sequela of medical renal disease. Electronically Signed   By: Davina Poke D.O.    On: 03/08/2020 15:57   IR Fluoro Guide CV Line Right  Result Date: 03/10/2020 INDICATION: 59 year old with multiple medical problems and poor venous access. Request for central line placement. EXAM: PLACEMENT OF CENTRAL LINE WITH ULTRASOUND AND FLUOROSCOPIC GUIDANCE COMPARISON:  None. MEDICATIONS: None ANESTHESIA/SEDATION: None FLUOROSCOPY TIME:  18 seconds, 1 mGy COMPLICATIONS: None immediate. PROCEDURE: Informed consent was obtained for central line placement. Ultrasound confirmed a patent right internal jugular vein. Ultrasound image was saved for documentation. Right side of the neck was prepped and draped in sterile fashion. Maximal barrier sterile technique was utilized including caps, mask, sterile gowns, sterile gloves, sterile drape, hand hygiene and skin antiseptic. Skin was anesthetized with 1% lidocaine. Small incision was made. Using ultrasound guidance, 21 gauge needle was directed into the right internal jugular vein and wire was advanced centrally. Peel-away sheath was placed. A dual lumen power PICC line was cut to 15 cm. Catheter was placed through the peel-away sheath. Catheter tip was placed at the SVC and right atrium junction. Both lumens aspirated and flushed well. Both lumens were flushed with saline. Catheter was sutured to skin and a bandage was placed. Fluoroscopic and ultrasound images were taken and saved for documentation. FINDINGS: Catheter tip at the SVC and right atrium junction. IMPRESSION: Placement of  right jugular central line with ultrasound and fluoroscopic guidance. Electronically Signed   By: Markus Daft M.D.   On: 03/10/2020 17:16   IR US Guide Vasc Access Right  Result Date: 03/10/2020 INDICATION: 59 year old with multiple medical problems and poor venous access. Request for central line placement. EXAM: PLACEMENT OF CENTRAL LINE WITH ULTRASOUND AND FLUOROSCOPIC GUIDANCE COMPARISON:  None. MEDICATIONS: None ANESTHESIA/SEDATION: None FLUOROSCOPY TIME:  18 seconds, 1 mGy  COMPLICATIONS: None immediate. PROCEDURE: Informed consent was obtained for central line placement. Ultrasound confirmed a patent right internal jugular vein. Ultrasound image was saved for documentation. Right side of the neck was prepped and draped in sterile fashion. Maximal barrier sterile technique was utilized including caps, mask, sterile gowns, sterile gloves, sterile drape, hand hygiene and skin antiseptic. Skin was anesthetized with 1% lidocaine. Small incision was made. Using ultrasound guidance, 21 gauge needle was directed into the right internal jugular vein and wire was advanced centrally. Peel-away sheath was placed. A dual lumen power PICC line was cut to 15 cm. Catheter was placed through the peel-away sheath. Catheter tip was placed at the SVC and right atrium junction. Both lumens aspirated and flushed well. Both lumens were flushed with saline. Catheter was sutured to skin and a bandage was placed. Fluoroscopic and ultrasound images were taken and saved for documentation. FINDINGS: Catheter tip at the SVC and right atrium junction. IMPRESSION: Placement of right jugular central line with ultrasound and fluoroscopic guidance. Electronically Signed   By: Markus Daft M.D.   On: 03/10/2020 17:16   DG Chest Portable 1 View  Result Date: 03/08/2020 CLINICAL DATA:  Altered mental status EXAM: PORTABLE CHEST 1 VIEW COMPARISON:  12/24/2019 FINDINGS: Low volume chest. Cardiomegaly and vascular pedicle prominence accentuated by low volumes. Diffuse interstitial and airspace opacity. No visible effusion or pneumothorax. IMPRESSION: Low volume chest with indistinct opacity that could be scarring or active infection when compared with recent priors. Electronically Signed   By: Monte Fantasia M.D.   On: 03/08/2020 11:16   EEG adult  Result Date: 03/09/2020 Alexis Goodell, MD     03/09/2020  3:49 PM ELECTROENCEPHALOGRAM REPORT Patient: Susan [name]       Room #: [facility] EEG No. ID: [id number] Age: 59 y.o.         Sex: female Requesting Physician: Carson Myrtle Report Date:  03/09/2020       Interpreting Physician: Alexis Goodell History: Susan [name] is an 59 y.o. female with altered mental status Medications: Unasyn, ASA, Biktarvy, Neurontin, Sinequan, Seroquel, Crestor Conditions of Recording:  This is a 21 channel routine scalp EEG performed with bipolar and monopolar montages arranged in accordance to the international 10/20 system of electrode placement. One channel was dedicated to EKG recording. The patient is in the awake, drowsy and asleep states. Description:  Artifact is prominent during the recording often obscuring the background rhythm particularly during wakefulness therefore no waking background activity could be evaluated.  The patient does appear to drowse with a slow background noted consisting of irregular, low voltage delta and theta activity. The patient goes in to a light sleep with symmetrical sleep spindles, vertex central sharp transients and irregular slow activity.  No epileptiform activity is noted.  Hyperventilation was not performed and intermittent photic stimulation were not performed. IMPRESSION: This is a technically difficult electroencephalogram secondary to the predominance of artifact.  Wakefulness could not be evaluated.  Normal drowse and sleep appeared to be present.  No epileptiform activity is noted.  Alexis Goodell, MD  Neurology [phone] 03/09/2020, 3:43 PM   CT HEAD CODE STROKE WO CONTRAST`  Result Date: 03/08/2020 CLINICAL DATA:  Code stroke. EXAM: CT HEAD WITHOUT CONTRAST TECHNIQUE: Contiguous axial images were obtained from the base of the skull through the vertex without intravenous contrast. COMPARISON:  11/24/2019 FINDINGS: Brain: There is no acute intracranial hemorrhage or mass effect. Now chronic bilateral parietal and left temporal infarcts are identified. There is new hypoattenuation with loss of gray-white differentiation in the parasagittal right occipital lobe  likely extending into the parietal lobe. Ventricles are stable in size without evidence of hydrocephalus. Vascular: No hyperdense vessel. There is intracranial atherosclerotic calcification at the skull base. Skull: Unremarkable. Sinuses/Orbits: Aerated.  Orbits are unremarkable. Other: Mastoid air cells are clear. IMPRESSION: No acute intracranial hemorrhage or significant mass effect. Age-indeterminate right parasagittal occipital infarct with parietal extension. Chronic bilateral MCA territory infarcts. These results were communicated to Dr. Leonel Ramsay at 11:30 amon 7/6/2021by text page via the [facility] messaging system. Electronically Signed   By: Macy Mis M.D.   On: 03/08/2020 11:35     Subjective: No acute issues or events overnight, denies nausea, vomiting, diarrhea, constipation, headache, fevers, chills.   Discharge Exam: Vitals:   04/03/20 0744 04/03/20 1028  BP: (!) 142/62 104/67  Pulse: 85 85  Resp: 14 14  Temp: 98.3 F (36.8 C) 98.6 F (37 C)  SpO2: 96% 97%   Vitals:   04/02/20 2328 04/03/20 0500 04/03/20 0744 04/03/20 1028  BP: (!) 103/91 (!) 130/65 (!) 142/62 104/67  Pulse: 79  85 85  Resp: 20 (!) _0 Temp: 98.2 F (36.8 C) 98.1 F (36.7 C) 98.3 F (36.8 C) 98.6 F (37 C)  TempSrc: Oral Oral Oral Oral  SpO2: 94% 99% 96% 97%  Weight:      Height:        General: Pt is alert, awake, not in acute distress Cardiovascular: RRR, S1/S2 +, no rubs, no gallops Respiratory: CTA bilaterally, no wheezing, no rhonchi Abdominal: Soft, NT, ND, bowel sounds + Extremities: no edema, no cyanosis    The results of significant diagnostics from this hospitalization (including imaging, microbiology, ancillary and laboratory) are listed below for reference.     Microbiology: Recent Results (from the past 240 hour(s))  SARS Coronavirus 2 by RT PCR (hospital order, performed in [facility] hospital lab) Nasopharyngeal Nasopharyngeal Swab     Status: None   Collection  Time: 04/02/20  2:27 AM   Specimen: Nasopharyngeal Swab  Result Value Ref Range Status   SARS Coronavirus 2 NEGATIVE NEGATIVE Final    Comment: (NOTE) SARS-CoV-2 target nucleic acids are NOT DETECTED.  The SARS-CoV-2 RNA is generally detectable in upper and lower respiratory specimens during the acute phase of infection. The lowest concentration of SARS-CoV-2 viral copies this assay can detect is 250 copies / mL. A negative result does not preclude SARS-CoV-2 infection and should not be used as the sole basis for treatment or other patient management decisions.  A negative result may occur with improper specimen collection / handling, submission of specimen other than nasopharyngeal swab, presence of viral mutation(s) within the areas targeted by this assay, and inadequate number of viral copies (<250 copies / mL). A negative result must be combined with clinical observations, patient history, and epidemiological information.  Fact Sheet for Patients:   StrictlyIdeas.no  Fact Sheet for Healthcare Providers: BankingDealers.co.za  This test is not yet approved or  cleared by the [place] FDA and has been authorized for  detection and/or diagnosis of SARS-CoV-2 by FDA under an Emergency Use Authorization (EUA).  This EUA will remain in effect (meaning this test can be used) for the duration of the COVID-19 declaration under Section 564(b)(1) of the Act, 21 U.S.C. section 360bbb-3(b)(1), unless the authorization is terminated or revoked sooner.  Performed at [facility], [place] [address] [place], [place] [address]   MRSA PCR Screening     Status: None   Collection Time: 04/02/20  3:55 AM   Specimen: Nasal Mucosa; Nasopharyngeal  Result Value Ref Range Status   MRSA by PCR NEGATIVE NEGATIVE Final    Comment:        The GeneXpert MRSA Assay (FDA approved for NASAL specimens only), is one component of a comprehensive MRSA  colonization surveillance program. It is not intended to diagnose MRSA infection nor to guide or monitor treatment for MRSA infections. Performed at [facility], [place] [address], [place], [place] [address]      Labs: BNP (last 3 results) Recent Labs    11/13/19 1116 11/17/19 0341  BNP 798.4* 537.4*   Basic Metabolic Panel: Recent Labs  Lab 04/01/20 1344 04/02/20 0415 04/03/20 0120  NA 131* 142 138  K 3.3* 3.3* 3.8  CL 95* 104 103  CO2 _0 GLUCOSE 216* 79 189*  BUN _1 CREATININE 1.68* 1.10* 1.11*  CALCIUM 8.9 9.4 9.0   Liver Function Tests: Recent Labs  Lab 04/03/20 0120  AST 28  ALT 27  ALKPHOS 87  BILITOT 0.3  PROT 6.2*  ALBUMIN 3.0*   No results for input(s): LIPASE, AMYLASE in the last 168 hours. Recent Labs  Lab 04/02/20 0415  AMMONIA 22   CBC: Recent Labs  Lab 04/01/20 1344 04/02/20 0415 04/03/20 0120  WBC 8.6 7.3 6.8  NEUTROABS 3.6  --   --   HGB 13.3 15.6* 12.7  HCT 41.1 47.6* 39.8  MCV 89.2 86.7 87.3  PLT 234 315 284   Cardiac Enzymes: No results for input(s): CKTOTAL, CKMB, CKMBINDEX, TROPONINI in the last 168 hours. BNP: Invalid input(s): POCBNP CBG: Recent Labs  Lab 04/02/20 1121 04/02/20 1646 04/02/20 2113 04/03/20 0632 04/03/20 1106  GLUCAP 170* 204* 176* 193* 108*   D-Dimer No results for input(s): DDIMER in the last 72 hours. Hgb A1c No results for input(s): HGBA1C in the last 72 hours. Lipid Profile No results for input(s): CHOL, HDL, LDLCALC, TRIG, CHOLHDL, LDLDIRECT in the last 72 hours. Thyroid function studies Recent Labs    04/02/20 0415  TSH 0.330*   Anemia work up Recent Labs    04/02/20 0415  VITAMINB12 378   Urinalysis    Component Value Date/Time   COLORURINE STRAW (A) 03/08/2020 1306   APPEARANCEUR CLEAR 03/08/2020 1306   LABSPEC 1.022 03/08/2020 1306   PHURINE 5.0 03/08/2020 1306   GLUCOSEU >=500 (A) 03/08/2020 1306   HGBUR NEGATIVE 03/08/2020 1306   BILIRUBINUR  NEGATIVE 03/08/2020 1306   KETONESUR NEGATIVE 03/08/2020 1306   PROTEINUR NEGATIVE 03/08/2020 1306   UROBILINOGEN 1.0 06/01/2015 0104   NITRITE NEGATIVE 03/08/2020 1306   LEUKOCYTESUR NEGATIVE 03/08/2020 1306   Sepsis Labs Invalid input(s): PROCALCITONIN,  WBC,  LACTICIDVEN Microbiology Recent Results (from the past 240 hour(s))  SARS Coronavirus 2 by RT PCR (hospital order, performed in [place] hospital lab) Nasopharyngeal Nasopharyngeal Swab     Status: None   Collection Time: 04/02/20  2:27 AM   Specimen: Nasopharyngeal Swab  Result Value Ref Range  Status   SARS Coronavirus 2 NEGATIVE NEGATIVE Final    Comment: (NOTE) SARS-CoV-2 target nucleic acids are NOT DETECTED.  The SARS-CoV-2 RNA is generally detectable in upper and lower respiratory specimens during the acute phase of infection. The lowest concentration of SARS-CoV-2 viral copies this assay can detect is 250 copies / mL. A negative result does not preclude SARS-CoV-2 infection and should not be used as the sole basis for treatment or other patient management decisions.  A negative result may occur with improper specimen collection / handling, submission of specimen other than nasopharyngeal swab, presence of viral mutation(s) within the areas targeted by this assay, and inadequate number of viral copies (<250 copies / mL). A negative result must be combined with clinical observations, patient history, and epidemiological information.  Fact Sheet for Patients:   StrictlyIdeas.no  Fact Sheet for Healthcare Providers: BankingDealers.co.za  This test is not yet approved or  cleared by the [place] FDA and has been authorized for detection and/or diagnosis of SARS-CoV-2 by FDA under an Emergency Use Authorization (EUA).  This EUA will remain in effect (meaning this test can be used) for the duration of the COVID-19 declaration under Section 564(b)(1) of the Act, 21  U.S.C. section 360bbb-3(b)(1), unless the authorization is terminated or revoked sooner.  Performed at [facility], [place] [address], [place], [place] [address]   MRSA PCR Screening     Status: None   Collection Time: 04/02/20  3:55 AM   Specimen: Nasal Mucosa; Nasopharyngeal  Result Value Ref Range Status   MRSA by PCR NEGATIVE NEGATIVE Final    Comment:        The GeneXpert MRSA Assay (FDA approved for NASAL specimens only), is one component of a comprehensive MRSA colonization surveillance program. It is not intended to diagnose MRSA infection nor to guide or monitor treatment for MRSA infections. Performed at [facility], [place] [address], [place], [place] [address]      Time coordinating discharge: Over 30 minutes  SIGNED:   Little Ishikawa, DO [facility] 04/03/2020, 12:53 PM Pager   If 7PM-7AM, please contact night-coverage [email or web address]

## 2020-04-03 NOTE — TOC Initial Note (Signed)
Transition of Care [facility]) - Initial/Assessment Note    Patient Details  Name: Susan [name] MRN: [id number] Date of Birth: 17-Jun-1961  Transition [facility]) CM/SW Contact:    Carles Collet, RN Phone Number: 04/03/2020, 8:59 AM  Clinical Narrative:                 Notified by [facility] liaison that patient is active for RN PT OT.  Will need resumption orders for [facility].  TOC will continue to follow.   Expected Discharge Plan: [place] Barriers to Discharge: Continued Medical Work up   Patient Goals and CMS Choice        Expected Discharge Plan and Services Expected Discharge Plan: [place] Arranged: OT, PT, RN [facility] Agency: [place] ([place]) Date HH Agency Contacted: 04/03/20 Time [place]: [phone] Representative spoke with at [place]: Corene Cornea  Prior Living Arrangements/Services                       Activities of Daily Living   ADL Screening (condition at time of admission) Patient's cognitive ability adequate to safely complete daily activities?: Yes Is the patient deaf or have difficulty hearing?: No Does the patient have difficulty seeing, even when wearing glasses/contacts?: No Does the patient have difficulty concentrating, remembering, or making decisions?: Yes Patient able to express need for assistance with ADLs?: Yes Does the patient have difficulty dressing or bathing?: Yes Independently performs ADLs?: No Communication: Appropriate for developmental age Dressing (OT): Needs assistance Is this a change from baseline?: Pre-admission baseline Grooming: Needs assistance Is this a change from baseline?: Pre-admission baseline Feeding: Appropriate for developmental age Bathing: Needs assistance Is this a change from baseline?: Pre-admission baseline Toileting: Needs assistance Is this a change from baseline?: Pre-admission baseline In/Out Bed: Needs  assistance Is this a change from baseline?: Pre-admission baseline Walks in Home: Needs assistance Is this a change from baseline?: Pre-admission baseline Does the patient have difficulty walking or climbing stairs?: Yes Weakness of Legs: Both Weakness of Arms/Hands: None  Permission Sought/Granted                  Emotional Assessment              Admission diagnosis:  Toxic encephalopathy [G92] Encephalopathy [K53.97] Acute metabolic encephalopathy [Q73.41] Patient Active Problem List   Diagnosis Date Noted  . Encephalopathy 04/02/2020  . Acute metabolic encephalopathy 93/79/0240  . Muscle spasm 04/01/2020  . Acute encephalopathy 03/08/2020  . Palliative care by specialist   . Adult failure to thrive   . Hx of completed stroke 11/24/2019  . Acute kidney injury (AKI) with acute tubular necrosis (ATN) (HCC)   . Aortic valve endocarditis 11/14/2019  . Recurrent falls 10/15/2019  . Constipation 02/26/2019  . Schizophrenia ([place]) 02/18/2019  . AKI (acute kidney injury) ([place]) 02/17/2019  . Chronic respiratory failure ([place]) 02/16/2019  . Right hip pain 02/21/2018  . DNR (do not resuscitate) discussion 08/23/2017  . Weakness generalized 06/24/2017  . Osteoarthritis 07/14/2016  . Abnormal hemoglobin ([place]) 04/18/2016  . Hot flashes 10/16/2015  . PTSD (post-traumatic stress disorder) 05/12/2015  . MDD (major depressive disorder), recurrent episode, moderate ([place]) 05/11/2015  . Cannabis use disorder, severe, dependence ([place]) 05/11/2015  .  Tobacco use disorder 05/11/2015  . Diabetes mellitus type 2 without retinopathy ([place]) 01/28/2015  . Hereditary and idiopathic peripheral neuropathy 06/03/2013  . Dysfunctional uterine bleeding 06/04/2012  . Dyslipidemia   . GERD (gastroesophageal reflux disease)   . Schizoaffective disorder, bipolar type ([place]) 04/26/2011  . Morbid obesity ([place]) 04/26/2011  . HIV disease ([place]) 03/28/2011  . Hypertension 03/28/2011   PCP:  Hoyt Koch, MD Pharmacy:   [facility] [place], [place] [address] Phone: [phone] Fax: [phone]     Social Determinants of Health (SDOH) Interventions    Readmission Risk Interventions Readmission Risk Prevention Plan 03/11/2020 02/19/2019  Transportation Screening Complete Complete  Medication Review (RN Care Manager) Referral to Pharmacy Complete  PCP or Specialist appointment within 3-5 days of discharge Complete Complete  HRI or Home Care Consult Complete Complete  SW Recovery Care/Counseling Consult Complete Complete  Palliative Care Screening Not Applicable Not [place] Complete Not Applicable  Some recent data might be hidden

## 2020-04-03 NOTE — Progress Notes (Signed)
Patient via wheelchair to waiting car.  Discharge instructions reviewed with daughter.

## 2020-04-03 NOTE — Plan of Care (Signed)
  Problem: Education: Goal: Knowledge of General Education information will improve Description: Including pain rating scale, medication(s)/side effects and non-pharmacologic comfort measures 04/03/2020 0112 by Theron Arista, RN Outcome: Progressing 04/03/2020 0111 by Theron Arista, RN Outcome: Progressing   Problem: Health Behavior/Discharge Planning: Goal: Ability to manage health-related needs will improve 04/03/2020 0112 by Theron Arista, RN Outcome: Progressing 04/03/2020 0111 by Theron Arista, RN Outcome: Progressing   Problem: Clinical Measurements: Goal: Ability to maintain clinical measurements within normal limits will improve 04/03/2020 0112 by Theron Arista, RN Outcome: Progressing 04/03/2020 0111 by Theron Arista, RN Outcome: Progressing Goal: Will remain free from infection 04/03/2020 0112 by Theron Arista, RN Outcome: Progressing 04/03/2020 0111 by Theron Arista, RN Outcome: Progressing Goal: Diagnostic test results will improve 04/03/2020 0112 by Theron Arista, RN Outcome: Progressing 04/03/2020 0111 by Theron Arista, RN Outcome: Progressing Goal: Respiratory complications will improve 04/03/2020 0112 by Theron Arista, RN Outcome: Progressing 04/03/2020 0111 by Theron Arista, RN Outcome: Progressing Goal: Cardiovascular complication will be avoided 04/03/2020 0112 by Theron Arista, RN Outcome: Progressing 04/03/2020 0111 by Theron Arista, RN Outcome: Progressing   Problem: Activity: Goal: Risk for activity intolerance will decrease 04/03/2020 0112 by Theron Arista, RN Outcome: Progressing 04/03/2020 0111 by Theron Arista, RN Outcome: Progressing   Problem: Nutrition: Goal: Adequate nutrition will be maintained 04/03/2020 0112 by Theron Arista, RN Outcome: Progressing 04/03/2020 0111 by Theron Arista, RN Outcome: Progressing   Problem: Coping: Goal: Level of anxiety will  decrease 04/03/2020 0112 by Theron Arista, RN Outcome: Progressing 04/03/2020 0111 by Theron Arista, RN Outcome: Progressing   Problem: Elimination: Goal: Will not experience complications related to bowel motility 04/03/2020 0112 by Theron Arista, RN Outcome: Progressing 04/03/2020 0111 by Theron Arista, RN Outcome: Progressing Goal: Will not experience complications related to urinary retention 04/03/2020 0112 by Theron Arista, RN Outcome: Progressing 04/03/2020 0111 by Theron Arista, RN Outcome: Progressing   Problem: Pain Managment: Goal: General experience of comfort will improve 04/03/2020 0112 by Theron Arista, RN Outcome: Progressing 04/03/2020 0111 by Theron Arista, RN Outcome: Progressing   Problem: Safety: Goal: Ability to remain free from injury will improve 04/03/2020 0112 by Theron Arista, RN Outcome: Progressing 04/03/2020 0111 by Theron Arista, RN Outcome: Progressing   Problem: Skin Integrity: Goal: Risk for impaired skin integrity will decrease 04/03/2020 0112 by Theron Arista, RN Outcome: Progressing 04/03/2020 0111 by Theron Arista, RN Outcome: Progressing

## 2020-04-05 ENCOUNTER — Telehealth: Payer: Self-pay

## 2020-04-05 ENCOUNTER — Other Ambulatory Visit: Payer: Self-pay | Admitting: *Deleted

## 2020-04-05 NOTE — Patient Outreach (Signed)
[facility]) Care Management  04/05/2020  Susan [name] [birth year] [id number]   CSW received referral on 03/30/2020 for community resource/support.  CSW attempted outreach to pt on 04/05/2020 and left a HIPPA compliant voice message. CSW will mail pt an Unsuccessful Outreach Letter and will await callback or try again in 3-4 business day per policy.   Eduard Clos, MSW, [place] Worker  [place] [phone]

## 2020-04-05 NOTE — Telephone Encounter (Signed)
New message    The patient will not be admitted back under [facility] if the patient needs home health services they will need to contact a different agency.

## 2020-04-05 NOTE — Telephone Encounter (Signed)
Noted  

## 2020-04-07 LAB — VITAMIN B1: Vitamin B1 (Thiamine): 154.1 nmol/L (ref 66.5–200.0)

## 2020-04-08 ENCOUNTER — Other Ambulatory Visit: Payer: Self-pay | Admitting: Internal Medicine

## 2020-04-08 ENCOUNTER — Other Ambulatory Visit: Payer: Self-pay | Admitting: *Deleted

## 2020-04-08 NOTE — Patient Outreach (Signed)
[facility]) Care Management  04/08/2020  Susan [name] 10-Feb-1961 [id number]   CSW spoke with pt's daughter by phone and confirmed pt's identity.  CSW introduced self, role and reason for call. Pt's daughter, Susan [name], shared that her mother lives with her, her husband and 3 kids.  Susan [name] is working from home at this time so she is able to be there to assist. As well, she shares they have good family support from her siblings and extended family. Susan [name] shares that her her mom had several strokes earlier this year.  After SNF stay, a re-hospitalization for another CVA, she has returned home and is awaiting [facility] care arrangements that PCP is coordinating.   After lengthy conversation/screening, CSW discussed and will assist pt/daughter with community support; to include referral to Meals On Wheels (wait list), [employer] meals (for a trial), community food pantry/sites, adult day care and other resources to review and consider.  Per daughter, pt's PCP has completed the [facility] paperwork and they are awaiting word from [facility] for determination and arranging the [name] care.  Daughter pleased to know they may get some help in the home for caregiving.  CSW also discussed pt's mental health status; to which daughter says is stable and they will be following up at [facility] Sept. 30th.    Daughter appreciative of everyone's assistance and support.  CSW provided direct contact info for outreach to CSW if needs arise before follow up call.   Eduard Clos, MSW, [place] Worker  [place] [phone]

## 2020-04-08 NOTE — Patient Outreach (Signed)
[facility]) Care Management  04/08/2020  Susan [name] 1960/09/19 [id number]   Telephone Assessment Admission Observation 7/31-8/1- Toxic Encephalopathy  RN spoke with the pt's daughter Susan [name]) who provider an update on pt's ongoing management of care. Reports pt she is monitoring pt's glucose levels ranging from 118-124 and administrating pt's insulin as recommended. States pt is also following a heart healthy diet with a weight loss around 185 lbs weighing now at 160 lbs. Reports pt has followed up with Dr. Sharlet Salina since discharged from the hospital. RN offered to review all medications as daughter declined indicating the totally understanding all medications that were added and following all instructions as directly by the pt's provider.   Other information provider related to [place] who caregiver who reports pt has been discharged from services due to the lack of staffing. States pt was suppose to receive speech/PT/OT/RN. States Dr. Sharlet Salina is seeking another agency for this services along with working with the state dept on obtaining PCS services for this pt (MCR/MCD). States there maybe a underlining psychology issues with pt so Dr. Sharlet Salina made a referral to [facility] however unable to scheduled an appointment until Sept but willing to fill all psychotropic medications as needed until the scheduled appointment date.   Will continue to reiterate n the plan of care and prevention measures to avoid readmission with closer monitoring and adherence to all post op instructions along with Dr. Charlynne Cousins' recommendations for pending services. Informed daughter RN will alert the [facility] social worker of the best contact number to reach to pt for needed resources. No other issues or needs to address at this time based upon the current plan of care and actions in place.  Plan: Will follow up next week with ongoing care management services.  Raina Mina, RN Care Management  Coordinator [place] Office [phone]

## 2020-04-09 ENCOUNTER — Other Ambulatory Visit: Payer: Self-pay | Admitting: *Deleted

## 2020-04-11 ENCOUNTER — Other Ambulatory Visit: Payer: Self-pay | Admitting: *Deleted

## 2020-04-11 DIAGNOSIS — B20 Human immunodeficiency virus [HIV] disease: Secondary | ICD-10-CM

## 2020-04-11 MED ORDER — BIKTARVY 50-200-25 MG PO TABS: 1.0000 | ORAL_TABLET | Freq: Every day | ORAL | 5 refills | Status: DC

## 2020-04-14 ENCOUNTER — Telehealth: Payer: Self-pay | Admitting: Internal Medicine

## 2020-04-14 NOTE — Telephone Encounter (Signed)
No, I would recommend to keep current dosing recommended by hospital until follow up with our office.

## 2020-04-14 NOTE — Telephone Encounter (Signed)
Pt's daughter has been informed. She would also like to know will the pt be going back on the Tramadol or either Nucynta?

## 2020-04-14 NOTE — Telephone Encounter (Signed)
    Patients daughter calling, would like to discuss gabapentin (NEURONTIN) 100 MG capsule, stating while in the hospital the dosage was decreased. Should patient go back to previous dosage?

## 2020-04-15 ENCOUNTER — Other Ambulatory Visit: Payer: Self-pay | Admitting: *Deleted

## 2020-04-15 NOTE — Patient Outreach (Signed)
[facility]) Care Management  04/08/2020  Susan [name] 1961/04/09 [id number] LATE ENTRY  Telephone Assessment-Successful  RN spoke with pt's daughter who reports pt is doing "okay" however a few issues have occurred with Susan [name] dismissed pt due to lack of staff for the ordered services (PT/OT/RN/Speech). Dr. Sharlet Salina assisting with finding another agency to assist with these services. States pt glucose ranges from 118-124 and the daughter is administerig the pt's insulin at this time due to the previous over usage of other medications with toxic encephalopathy (ED visit). Also states pt awaiting services with [facility] who will not have appointments to Sept/Oct but willing to refill psych medications if needed. Pt is in needed for more care in the home. Dr. Sharlet Salina and [facility] social worker is working to obtain [employer] on one of their available programs if pt qualifies. Daughter states pt is consume heart healthy with a complete weight loss of 185 lbs weighing and now at 160 lbs.  Rn reiterated on the current plan of care and will continue to support with available tools and follow up accordingly.  Plan: Will follow up in a few weeks with ongoing case managements services and continue to offer tools and available services. Will verify caregiver has RN case manager's contact number for any additional needs.     Goals Addressed              This Visit's Progress   .  Per daughter: To have blood sugars controlled and not be readmitted (pt-stated)   On track     [name] (see longtitudinal plan of care for additional care plan information)  Objective:  Lab Results  Component Value Date   HGBA1C 11.7 (H) 03/08/2020 .   Lab Results  Component Value Date   CREATININE 1.05 (H) 03/20/2020   CREATININE 1.01 (H) 03/12/2020   CREATININE 1.22 (H) 03/10/2020 .   [name] No results found for: EGFR  Current Barriers:  [name] Knowledge Deficits related to basic  Diabetes pathophysiology and self care/management . Literacy barriers . Cognitive Deficits  Case Manager Clinical Goal(s):  Over the next 90 days, patient will demonstrate improved adherence to prescribed treatment plan for diabetes self care/management as evidenced by: A1C decreased </= 9 . Verbalize daily monitoring and recording of CBG within 28 days . Verbalize adherence to ADA/ carb modified diet within the next 28 days  Interventions:  . Provided education to patient about basic DM disease process . Discussed plans with patient for ongoing care management follow up and provided patient with direct contact information for care management team . Reviewed scheduled/upcoming provider appointments including: calling endocrinologist to schedule visit  . Advised patient, providing education and rationale, to check cbg 3 times a day and record, calling PCP and/or endocrinologist for findings outside established parameters.    Patient Self Care Activities:  . Attends all scheduled provider appointments . Checks blood sugars as prescribed and utilize hyper and hypoglycemia protocol as needed . Adheres to prescribed ADA/carb modified  Initial goal documentation        Raina Mina, RN Care Management Coordinator [place] Office [phone]

## 2020-04-15 NOTE — Telephone Encounter (Signed)
Called pt, LVM.   

## 2020-04-15 NOTE — Telephone Encounter (Signed)
That decision would also be made at follow up.

## 2020-04-16 DIAGNOSIS — J189 Pneumonia, unspecified organism: Secondary | ICD-10-CM | POA: Diagnosis not present

## 2020-04-16 DIAGNOSIS — E1110 Type 2 diabetes mellitus with ketoacidosis without coma: Secondary | ICD-10-CM | POA: Diagnosis not present

## 2020-04-16 DIAGNOSIS — R296 Repeated falls: Secondary | ICD-10-CM | POA: Diagnosis not present

## 2020-04-16 DIAGNOSIS — J45901 Unspecified asthma with (acute) exacerbation: Secondary | ICD-10-CM | POA: Diagnosis not present

## 2020-04-19 ENCOUNTER — Other Ambulatory Visit: Payer: Self-pay | Admitting: *Deleted

## 2020-04-20 NOTE — Patient Outreach (Signed)
[place] [facility]) Care Management  04/20/2020  Susan [name] Jun 11, 1961 [id number]   CSW spoke with pt's daughter on 04/19/2020 and updated her that the [facility] program indicated they had missed pt's phone interview on 04/15/2020.  CSW provided pt's daughter with the # to call to reschedule and stressed the need to do this asap before they close the case.  CSW will touch base again for updates next week.  Eduard Clos, MSW, [place] Worker  [place] [phone]

## 2020-04-25 ENCOUNTER — Other Ambulatory Visit: Payer: Self-pay | Admitting: *Deleted

## 2020-04-25 NOTE — Patient Outreach (Signed)
[place] [facility]) Care Management  04/25/2020  Susan [name] November 29, 1960 [id number]   CSW spoke with pt's daughter who has not heard back from [facility] office.  CSW provided pt with contact # again.  Pt's daughter inquired about assistance with her moms rent and bills.  CSW provided her with contact #'s for rent/utility assistance programs to seek assistance.  CSW will also mail pt/daughter some Civil engineer, contracting as well.  Per daughter, HH came three times and they are still waiting for Speech Therapy.  CSW encouraged daughter to call if needs arise prior to CSW follow up call.     Eduard Clos, MSW, [place] Worker  [place] [phone]

## 2020-05-02 ENCOUNTER — Other Ambulatory Visit: Payer: Self-pay

## 2020-05-02 ENCOUNTER — Encounter (HOSPITAL_COMMUNITY): Payer: Self-pay

## 2020-05-02 ENCOUNTER — Emergency Department (HOSPITAL_COMMUNITY)
Admission: EM | Admit: 2020-05-02 | Discharge: 2020-05-03 | Disposition: A | Discharge status: Home / Self Care | Payer: Medicare Other | Attending: Emergency Medicine | Admitting: Emergency Medicine

## 2020-05-02 DIAGNOSIS — Z5321 Procedure and treatment not carried out due to patient leaving prior to being seen by health care provider: Secondary | ICD-10-CM | POA: Insufficient documentation

## 2020-05-02 DIAGNOSIS — L299 Pruritus, unspecified: Secondary | ICD-10-CM | POA: Diagnosis not present

## 2020-05-02 DIAGNOSIS — Z743 Need for continuous supervision: Secondary | ICD-10-CM | POA: Diagnosis not present

## 2020-05-02 DIAGNOSIS — E1165 Type 2 diabetes mellitus with hyperglycemia: Secondary | ICD-10-CM | POA: Diagnosis not present

## 2020-05-02 DIAGNOSIS — L989 Disorder of the skin and subcutaneous tissue, unspecified: Secondary | ICD-10-CM | POA: Diagnosis not present

## 2020-05-02 DIAGNOSIS — R52 Pain, unspecified: Secondary | ICD-10-CM | POA: Diagnosis not present

## 2020-05-02 NOTE — ED Triage Notes (Signed)
Patient arrived via [facility] with complaints of burning/itching lesions on left hand and nose. Reports "difficulty picking at them" Patient is HIV positive. Reports using benadryl with no relief.

## 2020-05-02 NOTE — ED Notes (Signed)
Tried to draw blood but was unsuccessful

## 2020-05-03 NOTE — ED Notes (Signed)
Called for V/S re-check Pt not seen in lobby or outside

## 2020-05-06 ENCOUNTER — Other Ambulatory Visit: Payer: Self-pay | Admitting: Internal Medicine

## 2020-05-06 DIAGNOSIS — E119 Type 2 diabetes mellitus without complications: Secondary | ICD-10-CM

## 2020-05-16 ENCOUNTER — Other Ambulatory Visit: Payer: Self-pay | Admitting: *Deleted

## 2020-05-16 NOTE — Patient Outreach (Signed)
[place] [facility]) Care Management  05/16/2020  Susan [name] [birth year] [id number]   Telephone Assessment-Unsuccessful  RN attempted outreach call however unsuccessful. RN able to leave a HIPAA approved voice message and requesting a call back. Will further engage at that time.  Plan: Will attempt another outreach over the next week for ongoing [facility] services.  Raina Mina, RN Care Management Coordinator [place] Office [phone]

## 2020-05-17 DIAGNOSIS — R296 Repeated falls: Secondary | ICD-10-CM | POA: Diagnosis not present

## 2020-05-17 DIAGNOSIS — E1110 Type 2 diabetes mellitus with ketoacidosis without coma: Secondary | ICD-10-CM | POA: Diagnosis not present

## 2020-05-17 DIAGNOSIS — J45901 Unspecified asthma with (acute) exacerbation: Secondary | ICD-10-CM | POA: Diagnosis not present

## 2020-05-18 ENCOUNTER — Other Ambulatory Visit: Payer: Self-pay | Admitting: *Deleted

## 2020-05-18 NOTE — Patient Outreach (Signed)
[place] [facility]) Care Management  05/18/2020  Susan [name] Feb 27, 1961 [id number]   CSW spoke with pt who reports she was unable to go to her MD appointment and has rescheduled it for next week.  CSW asked pt call CSW back once the paperwork has been completed to further assist.  CSW will outreach pt next week if no follow up call is received by 05/25/2020.   Eduard Clos, MSW, [place] Worker  [place] [phone]

## 2020-05-20 ENCOUNTER — Other Ambulatory Visit: Payer: Self-pay | Admitting: *Deleted

## 2020-05-20 NOTE — Patient Outreach (Signed)
[facility] ([facility]) Care Management  05/20/2020  Migdalia A Kreiger 02/07/1961 [id number]  Telephone Assessment-Diabetes  RN spoke with the pt's daughter [name] who indicated pt is doing well with no acute issues. States pt has been set up with Mom's Meals and "much improved" with her eating habits. States pt has adjusted her diet by consuming more proteins and continues to work on reducing her carbohydrate intake. Reports AM read was 125 on average and all medication are locked up with daughter distributing all prescribed medications. Pt is never alone with a caregiver ready available. [facility] social worker has provided a list of agencies and daughter states she went with [facility]. Pt hs been evaluated and current pending the initial home visit.   Reports De. Ellison and the primary provider are pleased with the pt's progress with the most recent A1C at 10 in September (was 11 in July).  Aware pt has a long way to go (goal <7) but willing to work with the pt on reducing this level. RN continue to discuss the risk with the elevated A1C and will continue to educate accordingly. Verified pt is more involved with her ADL and managing her independent care however still needs medication management as daughter states pt only has access to the medications dispensed to her to take as scheduled. Plan of care reviewed with one short term goal met. All other discussed as caregiver encouraged to discuss with pt for ongoing management of care.  Plan: Will follow up next month with ongoing Diabetes management of care and continue to educate accordingly on the importance of managing this condition.    Goals Addressed            This Visit's Progress   . 05/20/2020 [facility] Updated       CARE PLAN ENTRY (see longtitudinal plan of care for additional care plan information)  Objective:  Lab Results  Component Value Date   HGBA1C 11.7 (H) 03/08/2020 .   Lab Results  Component Value Date    CREATININE 1.05 (H) 03/20/2020   CREATININE 1.01 (H) 03/12/2020   CREATININE 1.22 (H) 03/10/2020 .   . No results found for: EGFR  Current Barriers:  . Knowledge Deficits related to basic Diabetes pathophysiology and self care/management . Literacy barriers . Cognitive Deficits  Case Manager Clinical Goal(s):  Over the next 90 days, patient will demonstrate improved adherence to prescribed treatment plan for diabetes self care/management as evidenced by: A1C decreased </= 9 . Verbalize daily monitoring and recording of CBG within 28 days . Verbalize adherence to ADA/ carb modified diet within the next 28 days  Interventions:  . Provided education to patient about basic DM disease process . Discussed plans with patient for ongoing care management follow up and provided patient with direct contact information for care management team . Reviewed scheduled/upcoming provider appointments including: calling endocrinologist to schedule visit  . Advised patient, providing education and rationale, to check cbg 3 times a day and record, calling PCP and/or endocrinologist for findings outside established parameters.    Patient Self Care Activities:  . Attends all scheduled provider appointments . Checks blood sugars as prescribed and utilize hyper and hypoglycemia protocol as needed . Adheres to prescribed ADA/carb modified  Initial goal documentation CBG-30 day met 05/20/2020         , RN Care Management Coordinator [facility] Main Office [phone] 

## 2020-05-24 ENCOUNTER — Ambulatory Visit: Payer: Self-pay | Admitting: *Deleted

## 2020-05-24 ENCOUNTER — Other Ambulatory Visit: Payer: Self-pay | Admitting: *Deleted

## 2020-05-24 NOTE — Patient Outreach (Signed)
[place] [facility]) Care Management  05/24/2020  Susan [name] 02/27/1961 [id number]  CSW attempted to reach pt to wish an early happy birthday to her. CSW left a message and will await callback from pt for updates on her PCP visit planned for yesterday.   Eduard Clos, MSW, [place] Worker  [place] [phone]

## 2020-05-25 ENCOUNTER — Ambulatory Visit: Payer: Medicare Other | Admitting: *Deleted

## 2020-06-09 ENCOUNTER — Other Ambulatory Visit: Payer: Self-pay | Admitting: *Deleted

## 2020-06-09 NOTE — Patient Outreach (Signed)
[place] [facility]) Care Management  06/09/2020  KATHRYNE RAMELLA 01-20-1961 [id number]   CSW spoke with pt's daughter who reports things are "great". Per daughter, [name], her mother is doing much better overall. "Her diabetes, blood pressure, mental state are good".  Daughter also shared with CSW that they have been able to secure the [place] assistance; providing 5 days weekly of care from 1-4pm.   Pt is also enjoying the meal delivery. Daughter denies any further needs or concerns for pt at this time.  Daughter also shared that she is going through cancer treatments and is glad to have the support of her husband and others.   CSW discussed plans for CSW to sign off and encouraged daughter to call if needs arise.  Daughter very appreciative of assistance.  CSW will advise PCP and [facility] team of above.   Eduard Clos, MSW, [place] Worker  [place] [phone]

## 2020-06-13 ENCOUNTER — Telehealth: Payer: Self-pay | Admitting: Internal Medicine

## 2020-06-13 NOTE — Telephone Encounter (Addendum)
If those orders are more than 30 days from 03/29/20 then they can not be signed off on. It is documentation in pt's chart throughout June/July stating that an OV was needed for verbal orders. Pt did not make an OV until 7/27. Per PCP only verbals orders could be signed off retroactively 30 days prior to that date.   I do not see a return call back number for Susan [name] to inform her of that.

## 2020-06-13 NOTE — Telephone Encounter (Signed)
Ria Comment with advanced home health needs forms signed  If another provider could print name, sign, and date with their NPI and statement that signing while Sharlet Salina is on leave  Fax to: [phone]  Need signed asap for insurance purposes (orders from July)  Scanned on 9.21.21 [facility] order and Pt Eval

## 2020-06-20 ENCOUNTER — Other Ambulatory Visit: Payer: Self-pay | Admitting: *Deleted

## 2020-06-20 NOTE — Patient Outreach (Signed)
[place] [facility]) Care Management  06/20/2020  Susan [name] 06-22-1961 [id number]  Telephone Assessment  Unsuccessful telephone call to patient's today no answer, left HIPAA compliant voicemail message requesting return call. Will rescheduled another outreach over the next week for ongoing [facility] services.  Raina Mina, RN Care Management Coordinator [place] Office [phone]

## 2020-06-24 ENCOUNTER — Other Ambulatory Visit: Payer: Self-pay | Admitting: *Deleted

## 2020-06-24 NOTE — Patient Outreach (Signed)
[place] [facility]) Care Management  06/24/2020  Susan [name] 1961-04-18 [id number]   Telephone Assessment-Successful-DM Closure to case management  Referral to Health Coach  RN spoke with the daughter Susan [name]) who reports pt is doing very good. Confirm hired help in the home that assist with pt during the day. Daughter continues to control the pt's medications with a locked box and is the only one to administers medication to the pt. States pt is doing so well with no acute problems or issues. Reports glucose readings in the AM ranging from 78-126 with no acute readings reported with review on all medications. Daughter very appreciative for all the assistance [facility] has offered over the month in managing this pt's needs.   Discussed all goals and interventions that have been met with again no acute events or issues at this time. Discussed health coaching due to pt's progress as daughter very receptive to this plan. Will refer to Health Coach and notify the provider of pt's disposition with [facility] services. Case closure for complex and pt will remain with disease management with a Health Coach via [facility] services.   Goals Addressed            This Visit's Progress   . COMPLETED: 05/20/2020 [facility] Updated       CARE PLAN ENTRY (see longtitudinal plan of care for additional care plan information)  Objective:  Lab Results  Component Value Date   HGBA1C 11.7 (H) 03/08/2020 .   Lab Results  Component Value Date   CREATININE 1.05 (H) 03/20/2020   CREATININE 1.01 (H) 03/12/2020   CREATININE 1.22 (H) 03/10/2020 .   [name] No results found for: EGFR  Current Barriers:  [name] Knowledge Deficits related to basic Diabetes pathophysiology and self care/management . Literacy barriers . Cognitive Deficits  Case Manager Clinical Goal(s):  Over the next 90 days, patient will demonstrate improved adherence to prescribed treatment plan for diabetes self care/management as evidenced by: A1C decreased  </= 9 . Verbalize daily monitoring and recording of CBG within 28 days . Verbalize adherence to ADA/ carb modified diet within the next 28 days  Interventions:  . Provided education to patient about basic DM disease process . Discussed plans with patient for ongoing care management follow up and provided patient with direct contact information for care management team . Reviewed scheduled/upcoming provider appointments including: calling endocrinologist to schedule visit  . Advised patient, providing education and rationale, to check cbg 3 times a day and record, calling PCP and/or endocrinologist for findings outside established parameters.    Patient Self Care Activities:  . Attends all scheduled provider appointments . Checks blood sugars as prescribed and utilize hyper and hypoglycemia protocol as needed . Adheres to prescribed ADA/carb modified  Initial goal documentation CBG-30 day met 05/20/2020        Raina Mina, RN Care Management Coordinator [place] Office [phone]

## 2020-06-27 ENCOUNTER — Other Ambulatory Visit: Payer: Self-pay | Admitting: *Deleted

## 2020-07-04 DIAGNOSIS — R296 Repeated falls: Secondary | ICD-10-CM | POA: Diagnosis not present

## 2020-07-04 DIAGNOSIS — J45901 Unspecified asthma with (acute) exacerbation: Secondary | ICD-10-CM | POA: Diagnosis not present

## 2020-07-04 DIAGNOSIS — E1110 Type 2 diabetes mellitus with ketoacidosis without coma: Secondary | ICD-10-CM | POA: Diagnosis not present

## 2020-07-18 ENCOUNTER — Encounter: Payer: Self-pay | Admitting: Family

## 2020-07-18 ENCOUNTER — Telehealth (INDEPENDENT_AMBULATORY_CARE_PROVIDER_SITE_OTHER): Payer: Medicare Other | Admitting: Family

## 2020-07-18 DIAGNOSIS — B379 Candidiasis, unspecified: Secondary | ICD-10-CM

## 2020-07-18 DIAGNOSIS — J069 Acute upper respiratory infection, unspecified: Secondary | ICD-10-CM | POA: Diagnosis not present

## 2020-07-18 MED ORDER — FLUCONAZOLE 100 MG PO TABS
100.0000 mg | ORAL_TABLET | Freq: Every day | ORAL | 0 refills | Status: DC
Start: 2020-07-18 — End: 2020-08-17
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
## 2020-07-20 ENCOUNTER — Emergency Department (HOSPITAL_COMMUNITY): Payer: Medicare Other

## 2020-07-20 ENCOUNTER — Other Ambulatory Visit: Payer: Self-pay

## 2020-07-20 ENCOUNTER — Inpatient Hospital Stay (HOSPITAL_COMMUNITY)
Admission: EM | Admit: 2020-07-20 | Discharge: 2020-08-17 | DRG: 077 | Disposition: A | Discharge status: Skilled Nursing Facility | Payer: Medicare Other | Attending: Internal Medicine | Admitting: Internal Medicine

## 2020-07-20 DIAGNOSIS — E1165 Type 2 diabetes mellitus with hyperglycemia: Secondary | ICD-10-CM | POA: Diagnosis present

## 2020-07-20 DIAGNOSIS — I10 Essential (primary) hypertension: Secondary | ICD-10-CM | POA: Diagnosis not present

## 2020-07-20 DIAGNOSIS — F17210 Nicotine dependence, cigarettes, uncomplicated: Secondary | ICD-10-CM | POA: Diagnosis present

## 2020-07-20 DIAGNOSIS — Z515 Encounter for palliative care: Secondary | ICD-10-CM | POA: Diagnosis not present

## 2020-07-20 DIAGNOSIS — R079 Chest pain, unspecified: Secondary | ICD-10-CM | POA: Diagnosis not present

## 2020-07-20 DIAGNOSIS — Z794 Long term (current) use of insulin: Secondary | ICD-10-CM

## 2020-07-20 DIAGNOSIS — I674 Hypertensive encephalopathy: Principal | ICD-10-CM | POA: Diagnosis present

## 2020-07-20 DIAGNOSIS — E875 Hyperkalemia: Secondary | ICD-10-CM | POA: Diagnosis present

## 2020-07-20 DIAGNOSIS — R29818 Other symptoms and signs involving the nervous system: Secondary | ICD-10-CM | POA: Diagnosis not present

## 2020-07-20 DIAGNOSIS — R569 Unspecified convulsions: Secondary | ICD-10-CM | POA: Diagnosis not present

## 2020-07-20 DIAGNOSIS — I672 Cerebral atherosclerosis: Secondary | ICD-10-CM | POA: Diagnosis not present

## 2020-07-20 DIAGNOSIS — Z7982 Long term (current) use of aspirin: Secondary | ICD-10-CM

## 2020-07-20 DIAGNOSIS — R4189 Other symptoms and signs involving cognitive functions and awareness: Secondary | ICD-10-CM | POA: Diagnosis present

## 2020-07-20 DIAGNOSIS — R410 Disorientation, unspecified: Secondary | ICD-10-CM

## 2020-07-20 DIAGNOSIS — I6783 Posterior reversible encephalopathy syndrome: Secondary | ICD-10-CM | POA: Diagnosis not present

## 2020-07-20 DIAGNOSIS — Z743 Need for continuous supervision: Secondary | ICD-10-CM | POA: Diagnosis not present

## 2020-07-20 DIAGNOSIS — F32A Depression, unspecified: Secondary | ICD-10-CM | POA: Diagnosis not present

## 2020-07-20 DIAGNOSIS — R1313 Dysphagia, pharyngeal phase: Secondary | ICD-10-CM | POA: Diagnosis present

## 2020-07-20 DIAGNOSIS — I499 Cardiac arrhythmia, unspecified: Secondary | ICD-10-CM | POA: Diagnosis not present

## 2020-07-20 DIAGNOSIS — F250 Schizoaffective disorder, bipolar type: Secondary | ICD-10-CM | POA: Diagnosis present

## 2020-07-20 DIAGNOSIS — M25551 Pain in right hip: Secondary | ICD-10-CM

## 2020-07-20 DIAGNOSIS — R4701 Aphasia: Secondary | ICD-10-CM | POA: Diagnosis present

## 2020-07-20 DIAGNOSIS — F17200 Nicotine dependence, unspecified, uncomplicated: Secondary | ICD-10-CM

## 2020-07-20 DIAGNOSIS — G9341 Metabolic encephalopathy: Secondary | ICD-10-CM | POA: Diagnosis present

## 2020-07-20 DIAGNOSIS — Z0189 Encounter for other specified special examinations: Secondary | ICD-10-CM

## 2020-07-20 DIAGNOSIS — E119 Type 2 diabetes mellitus without complications: Secondary | ICD-10-CM

## 2020-07-20 DIAGNOSIS — R404 Transient alteration of awareness: Secondary | ICD-10-CM | POA: Diagnosis not present

## 2020-07-20 DIAGNOSIS — B20 Human immunodeficiency virus [HIV] disease: Secondary | ICD-10-CM | POA: Diagnosis present

## 2020-07-20 DIAGNOSIS — R21 Rash and other nonspecific skin eruption: Secondary | ICD-10-CM | POA: Diagnosis not present

## 2020-07-20 DIAGNOSIS — D582 Other hemoglobinopathies: Secondary | ICD-10-CM

## 2020-07-20 DIAGNOSIS — E872 Acidosis: Secondary | ICD-10-CM | POA: Diagnosis not present

## 2020-07-20 DIAGNOSIS — K219 Gastro-esophageal reflux disease without esophagitis: Secondary | ICD-10-CM | POA: Diagnosis present

## 2020-07-20 DIAGNOSIS — I700 Atherosclerosis of aorta: Secondary | ICD-10-CM | POA: Diagnosis not present

## 2020-07-20 DIAGNOSIS — Z8673 Personal history of transient ischemic attack (TIA), and cerebral infarction without residual deficits: Secondary | ICD-10-CM | POA: Diagnosis not present

## 2020-07-20 DIAGNOSIS — I774 Celiac artery compression syndrome: Secondary | ICD-10-CM | POA: Diagnosis not present

## 2020-07-20 DIAGNOSIS — I6389 Other cerebral infarction: Secondary | ICD-10-CM | POA: Diagnosis not present

## 2020-07-20 DIAGNOSIS — F209 Schizophrenia, unspecified: Secondary | ICD-10-CM | POA: Diagnosis not present

## 2020-07-20 DIAGNOSIS — D72829 Elevated white blood cell count, unspecified: Secondary | ICD-10-CM | POA: Diagnosis not present

## 2020-07-20 DIAGNOSIS — Z888 Allergy status to other drugs, medicaments and biological substances status: Secondary | ICD-10-CM | POA: Diagnosis not present

## 2020-07-20 DIAGNOSIS — E7800 Pure hypercholesterolemia, unspecified: Secondary | ICD-10-CM | POA: Diagnosis present

## 2020-07-20 DIAGNOSIS — T50916A Underdosing of multiple unspecified drugs, medicaments and biological substances, initial encounter: Secondary | ICD-10-CM | POA: Diagnosis present

## 2020-07-20 DIAGNOSIS — F419 Anxiety disorder, unspecified: Secondary | ICD-10-CM | POA: Diagnosis present

## 2020-07-20 DIAGNOSIS — K8020 Calculus of gallbladder without cholecystitis without obstruction: Secondary | ICD-10-CM | POA: Diagnosis not present

## 2020-07-20 DIAGNOSIS — R4182 Altered mental status, unspecified: Secondary | ICD-10-CM | POA: Diagnosis not present

## 2020-07-20 DIAGNOSIS — Z79899 Other long term (current) drug therapy: Secondary | ICD-10-CM

## 2020-07-20 DIAGNOSIS — Z4659 Encounter for fitting and adjustment of other gastrointestinal appliance and device: Secondary | ICD-10-CM

## 2020-07-20 DIAGNOSIS — F308 Other manic episodes: Secondary | ICD-10-CM | POA: Diagnosis not present

## 2020-07-20 DIAGNOSIS — R6889 Other general symptoms and signs: Secondary | ICD-10-CM | POA: Diagnosis not present

## 2020-07-20 DIAGNOSIS — G9389 Other specified disorders of brain: Secondary | ICD-10-CM | POA: Diagnosis not present

## 2020-07-20 DIAGNOSIS — Z7984 Long term (current) use of oral hypoglycemic drugs: Secondary | ICD-10-CM

## 2020-07-20 DIAGNOSIS — G40901 Epilepsy, unspecified, not intractable, with status epilepticus: Secondary | ICD-10-CM | POA: Diagnosis present

## 2020-07-20 DIAGNOSIS — Z20822 Contact with and (suspected) exposure to covid-19: Secondary | ICD-10-CM | POA: Diagnosis present

## 2020-07-20 DIAGNOSIS — Q2547 Right aortic arch: Secondary | ICD-10-CM | POA: Diagnosis not present

## 2020-07-20 DIAGNOSIS — Z91138 Patient's unintentional underdosing of medication regimen for other reason: Secondary | ICD-10-CM

## 2020-07-20 DIAGNOSIS — E11649 Type 2 diabetes mellitus with hypoglycemia without coma: Secondary | ICD-10-CM | POA: Diagnosis not present

## 2020-07-20 DIAGNOSIS — I708 Atherosclerosis of other arteries: Secondary | ICD-10-CM | POA: Diagnosis not present

## 2020-07-20 DIAGNOSIS — Q278 Other specified congenital malformations of peripheral vascular system: Secondary | ICD-10-CM | POA: Diagnosis not present

## 2020-07-20 LAB — CBC
HCT: 48.6 % — ABNORMAL HIGH (ref 36.0–46.0)
Hemoglobin: 16.1 g/dL — ABNORMAL HIGH (ref 12.0–15.0)
MCH: 28.8 pg (ref 26.0–34.0)
MCHC: 33.1 g/dL (ref 30.0–36.0)
MCV: 86.8 fL (ref 80.0–100.0)
Platelets: 240 10*3/uL (ref 150–400)
RBC: 5.6 MIL/uL — ABNORMAL HIGH (ref 3.87–5.11)
RDW: 14.1 % (ref 11.5–15.5)
WBC: 7.6 10*3/uL (ref 4.0–10.5)
nRBC: 0 % (ref 0.0–0.2)

## 2020-07-20 LAB — TROPONIN I (HIGH SENSITIVITY)
Troponin I (High Sensitivity): 5 ng/L (ref <18)
Troponin I (High Sensitivity): 4 ng/L (ref <18)

## 2020-07-20 LAB — COMPREHENSIVE METABOLIC PANEL
ALT: 10 U/L (ref 0–44)
AST: 12 U/L — ABNORMAL LOW (ref 15–41)
Albumin: 3 g/dL — ABNORMAL LOW (ref 3.5–5.0)
Alkaline Phosphatase: 111 U/L (ref 38–126)
Anion gap: 9 (ref 5–15)
BUN: 12 mg/dL (ref 6–20)
CO2: 24 mmol/L (ref 22–32)
Calcium: 8.9 mg/dL (ref 8.9–10.3)
Chloride: 106 mmol/L (ref 98–111)
Creatinine, Ser: 0.93 mg/dL (ref 0.44–1.00)
GFR, Estimated: >60 mL/min (ref >60)
Glucose, Bld: 188 mg/dL — ABNORMAL HIGH (ref 70–99)
Potassium: 3.8 mmol/L (ref 3.5–5.1)
Sodium: 139 mmol/L (ref 135–145)
Total Bilirubin: 0.4 mg/dL (ref 0.3–1.2)
Total Protein: 6.1 g/dL — ABNORMAL LOW (ref 6.5–8.1)

## 2020-07-20 LAB — URINALYSIS, ROUTINE W REFLEX MICROSCOPIC
Bilirubin Urine: NEGATIVE
Glucose, UA: 50 mg/dL — AB
Hgb urine dipstick: NEGATIVE
Ketones, ur: NEGATIVE mg/dL
Leukocytes,Ua: NEGATIVE
Nitrite: NEGATIVE
Protein, ur: NEGATIVE mg/dL
Specific Gravity, Urine: 1.028 (ref 1.005–1.030)
pH: 8 (ref 5.0–8.0)

## 2020-07-20 LAB — I-STAT CHEM 8, ED
BUN: 13 mg/dL (ref 6–20)
Calcium, Ion: 1.1 mmol/L — ABNORMAL LOW (ref 1.15–1.40)
Chloride: 105 mmol/L (ref 98–111)
Creatinine, Ser: 0.8 mg/dL (ref 0.44–1.00)
Glucose, Bld: 183 mg/dL — ABNORMAL HIGH (ref 70–99)
HCT: 48 % — ABNORMAL HIGH (ref 36.0–46.0)
Hemoglobin: 16.3 g/dL — ABNORMAL HIGH (ref 12.0–15.0)
Potassium: 3.8 mmol/L (ref 3.5–5.1)
Sodium: 141 mmol/L (ref 135–145)
TCO2: 24 mmol/L (ref 22–32)

## 2020-07-20 LAB — RESPIRATORY PANEL BY RT PCR (FLU A&B, COVID)
Influenza A by PCR: NEGATIVE
Influenza B by PCR: NEGATIVE
SARS Coronavirus 2 by RT PCR: NEGATIVE

## 2020-07-20 LAB — DIFFERENTIAL
Abs Immature Granulocytes: 0.01 10*3/uL (ref 0.00–0.07)
Basophils Absolute: 0.1 10*3/uL (ref 0.0–0.1)
Basophils Relative: 1 %
Eosinophils Absolute: 0.3 10*3/uL (ref 0.0–0.5)
Eosinophils Relative: 4 %
Immature Granulocytes: 0 %
Lymphocytes Relative: 41 %
Lymphs Abs: 3.1 10*3/uL (ref 0.7–4.0)
Monocytes Absolute: 0.4 10*3/uL (ref 0.1–1.0)
Monocytes Relative: 6 %
Neutro Abs: 3.7 10*3/uL (ref 1.7–7.7)
Neutrophils Relative %: 48 %

## 2020-07-20 LAB — I-STAT BETA HCG BLOOD, ED (MC, WL, AP ONLY): I-stat hCG, quantitative: <5 m[IU]/mL (ref <5)

## 2020-07-20 LAB — PROTIME-INR
INR: 0.9 (ref 0.8–1.2)
Prothrombin Time: 11.9 seconds (ref 11.4–15.2)

## 2020-07-20 LAB — AMMONIA: Ammonia: 53 umol/L — ABNORMAL HIGH (ref 9–35)

## 2020-07-20 LAB — CBG MONITORING, ED: Glucose-Capillary: 185 mg/dL — ABNORMAL HIGH (ref 70–99)

## 2020-07-20 LAB — APTT: aPTT: 27 seconds (ref 24–36)

## 2020-07-20 LAB — LIPASE, BLOOD: Lipase: 18 U/L (ref 11–51)

## 2020-07-20 IMAGING — DX DG CHEST 1V PORT
1 series · 1 of 1 positions shown · non-contrast
Comparison: Chest radiograph dated [DATE].

CLINICAL DATA: 59-year-old female with altered mental status.

EXAM:
PORTABLE CHEST 1 VIEW

[chest]
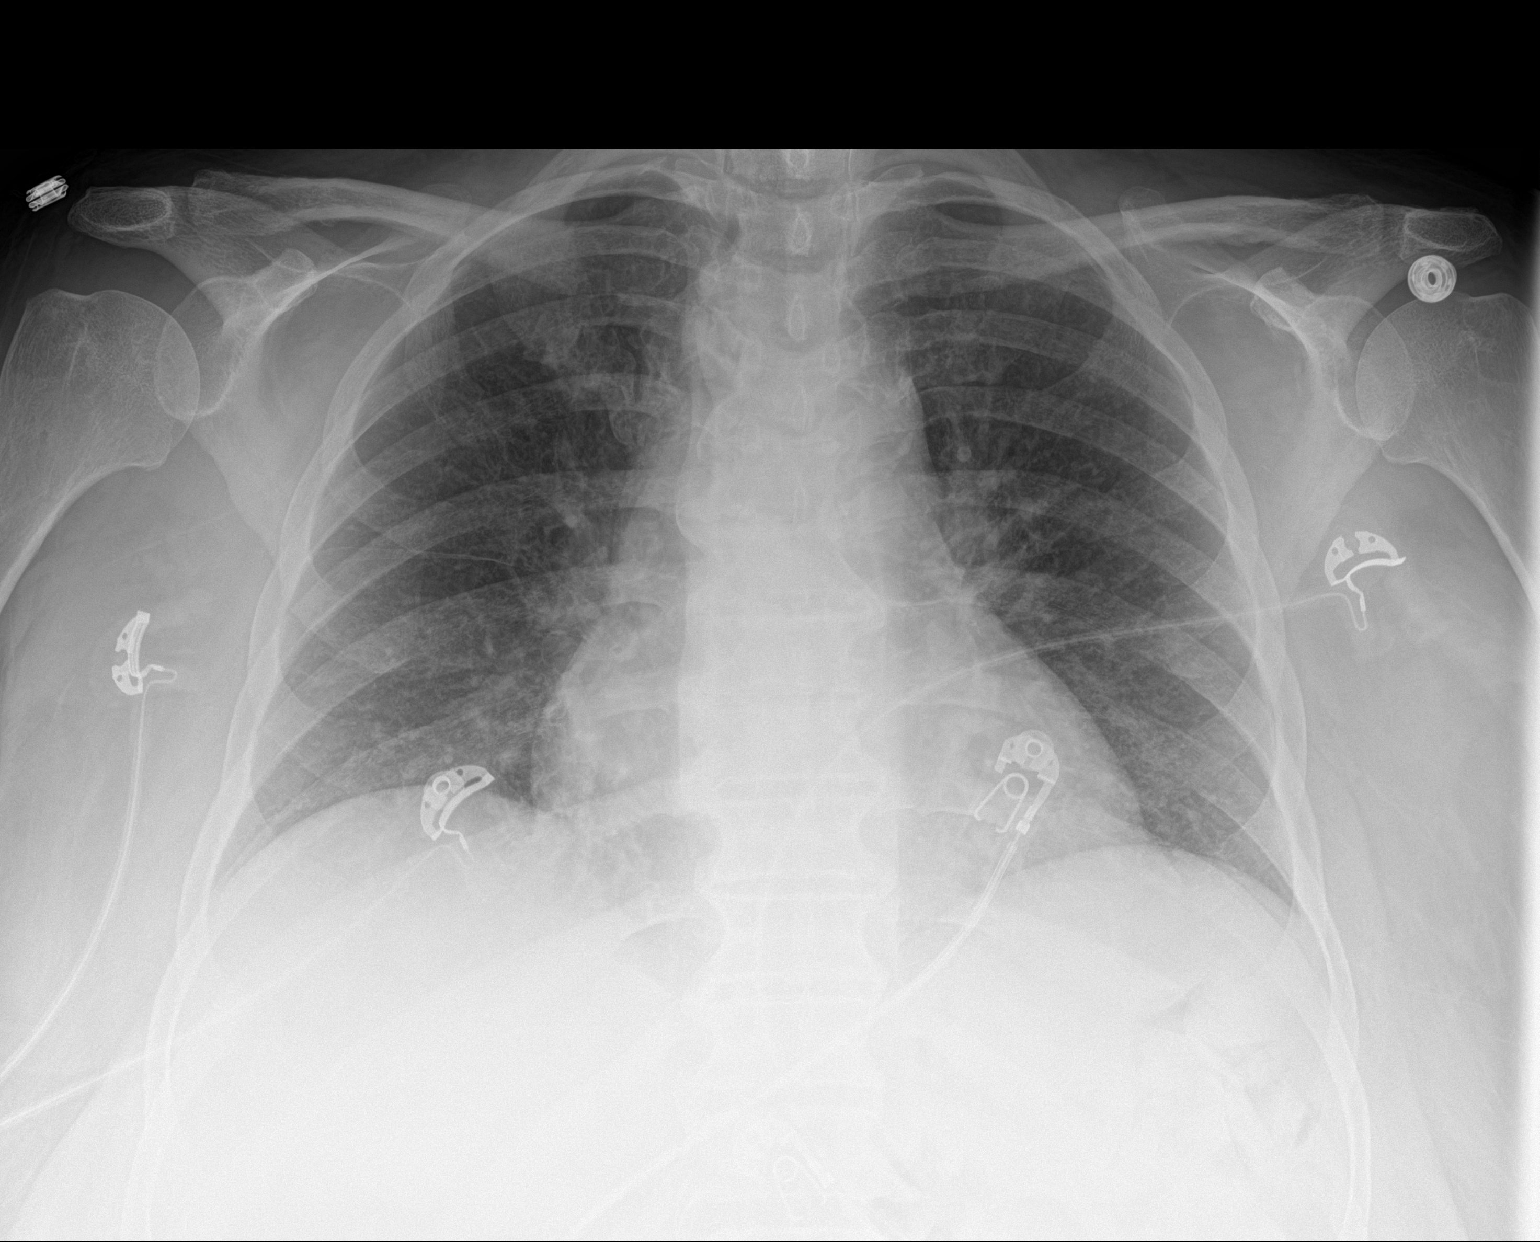

[1 of 1 positions shown; findings below may reference images not displayed]

FINDINGS: The lungs are clear. There is no pleural effusion pneumothorax. The
cardiac silhouette is within normal limits. Atherosclerotic
calcification of the aortic arch. No acute osseous pathology.
IMPRESSION: No active disease.

## 2020-07-20 IMAGING — CT CT HEAD CODE STROKE
3 series · 15 of 47 positions shown, 18 images · non-contrast
Comparison: [DATE]

CLINICAL DATA: Code stroke.

EXAM:
CT HEAD WITHOUT CONTRAST
TECHNIQUE: Contiguous axial images were obtained from the base of the skull
through the vertex without intravenous contrast.

[Series 3: head 5.0 h30s · axial · 0.44mm/px · z∈[-72,+63]mm · 9 of 33 slices shown, 12 images]
[im 3/33  brain]
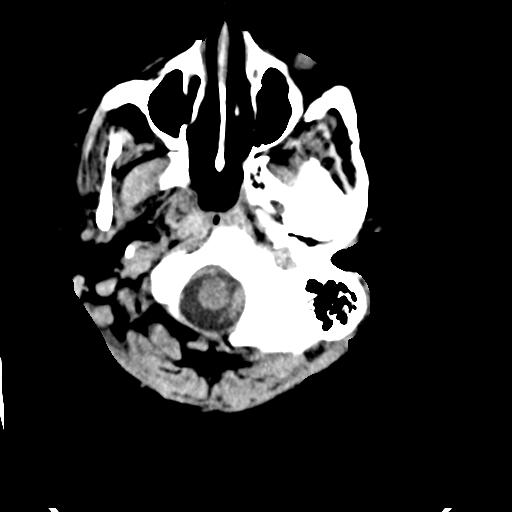
[im 3/33  bone]
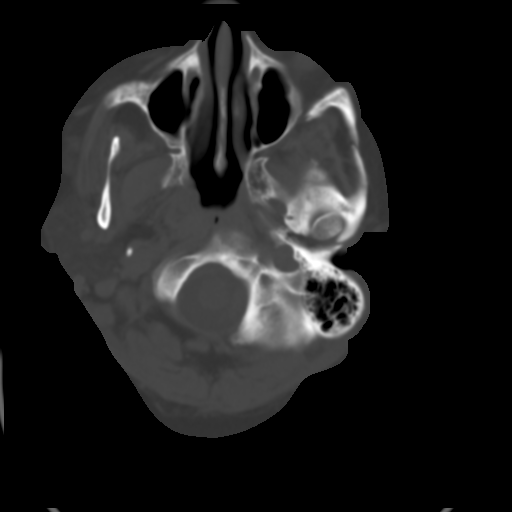
[im 6/33  brain]
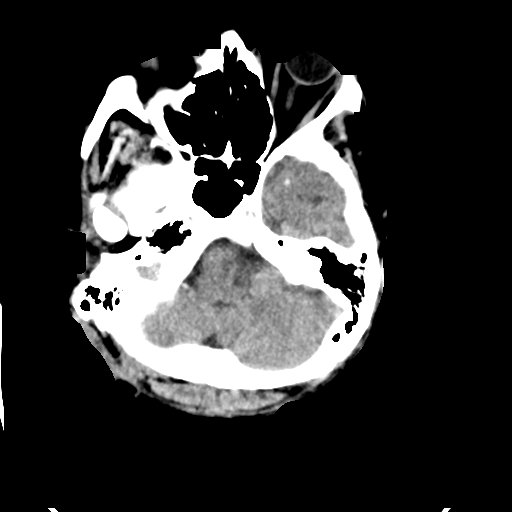
[im 9/33  brain]
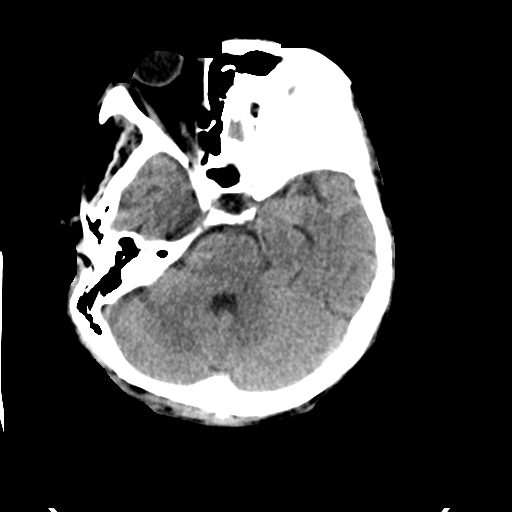
[im 13/33  brain]
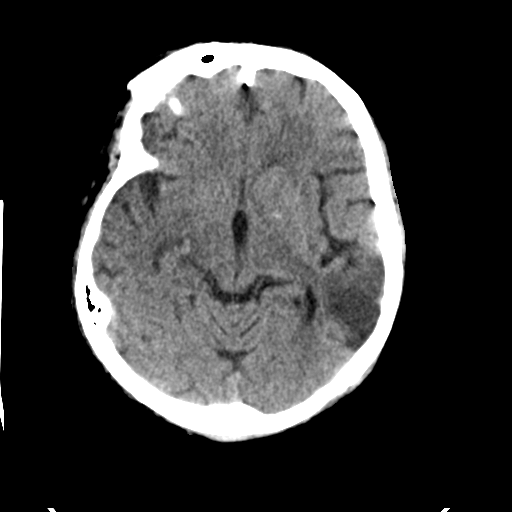
[im 17/33  brain]
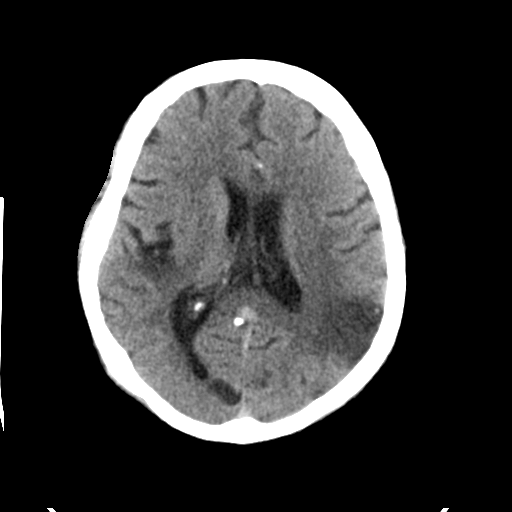
[im 17/33  bone]
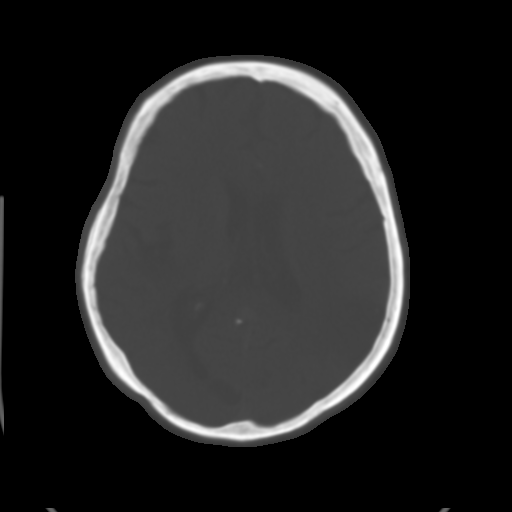
[im 20/33  brain]
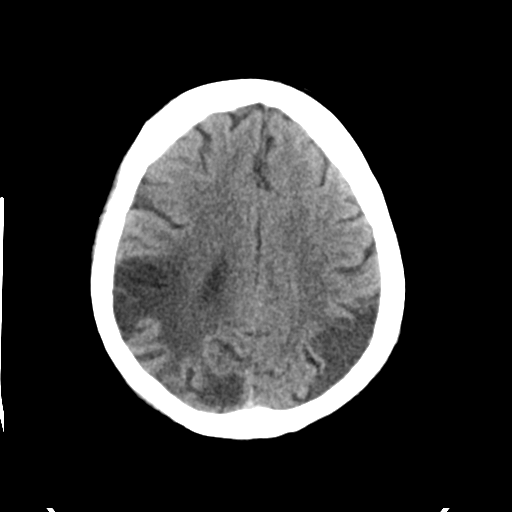
[im 24/33  brain]
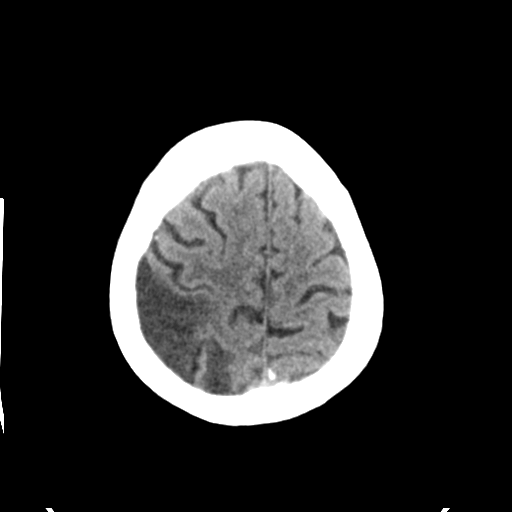
[im 27/33  brain]
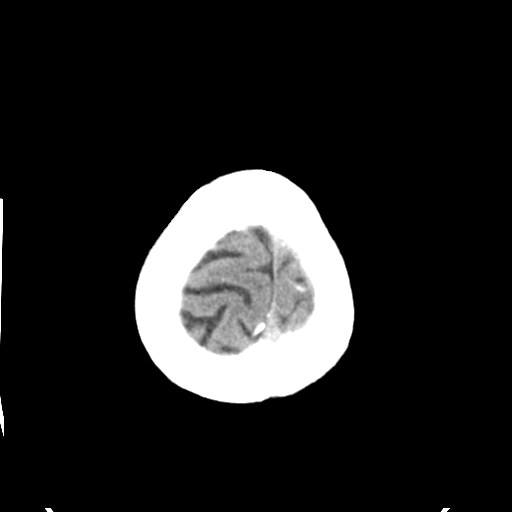
[im 30/33  brain]
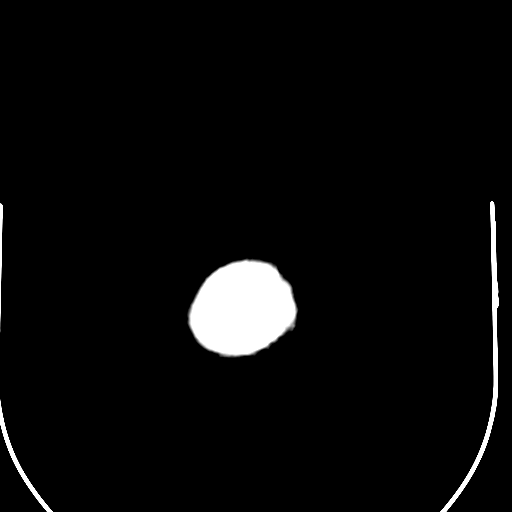
[im 30/33  bone]
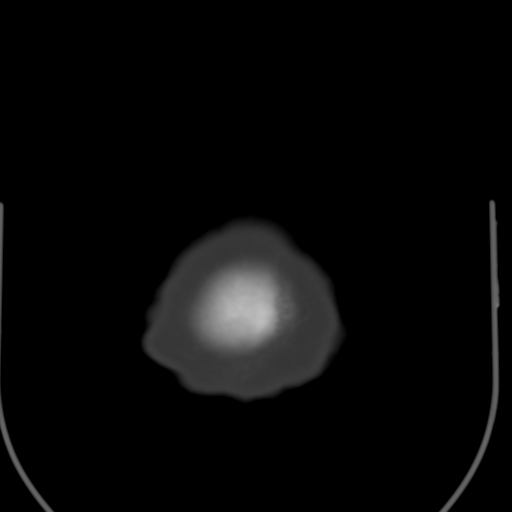

[Series 5: head 3.0 mpr cor · coronal · 0.32mm/px · 3 of 67 slices shown]
[im 23/67  brain]
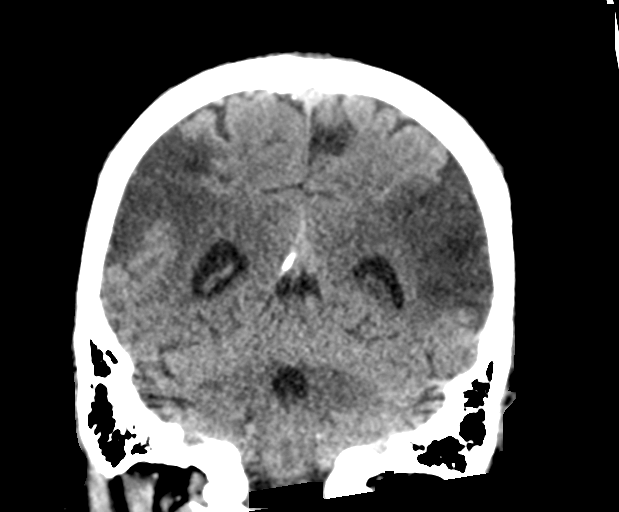
[im 30/67  brain]
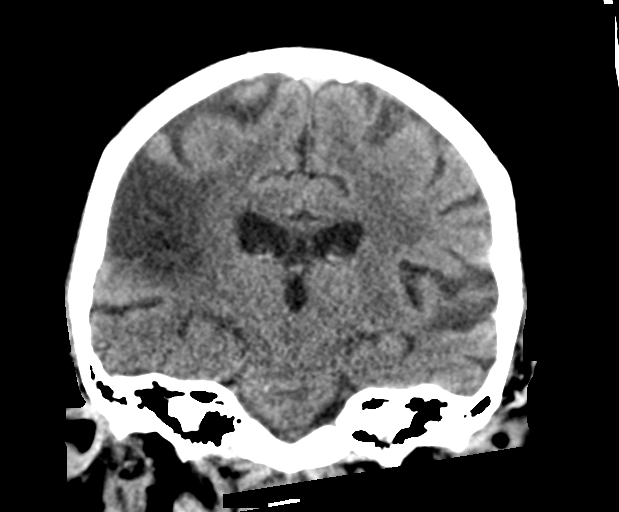
[im 37/67  brain]
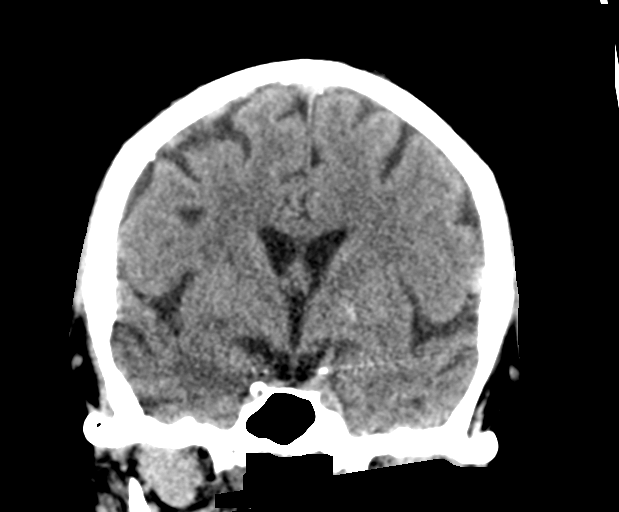

[Series 6: head 3.0 mpr sag · sagittal · 0.32mm/px · 3 of 67 slices shown]
[im 26/67  brain]
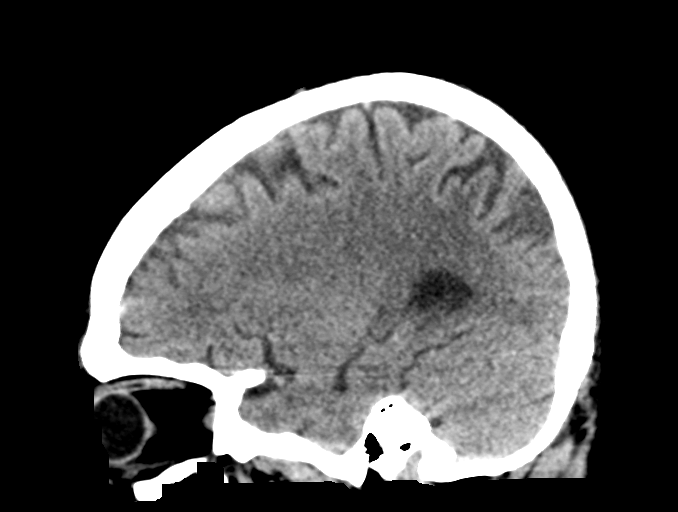
[im 34/67  brain]
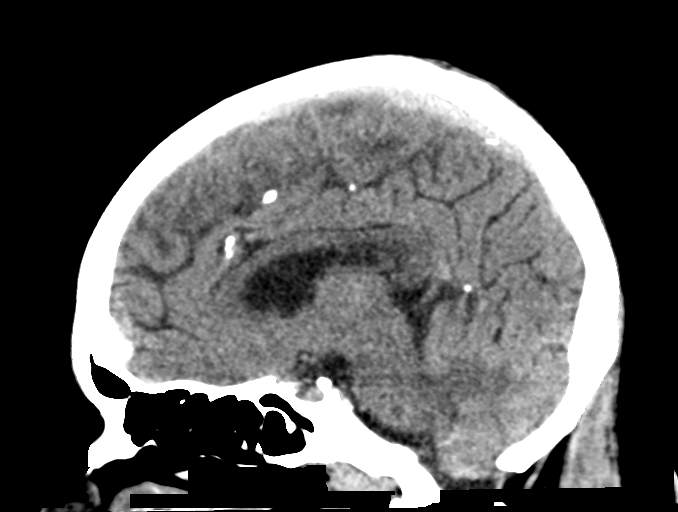
[im 42/67  brain]
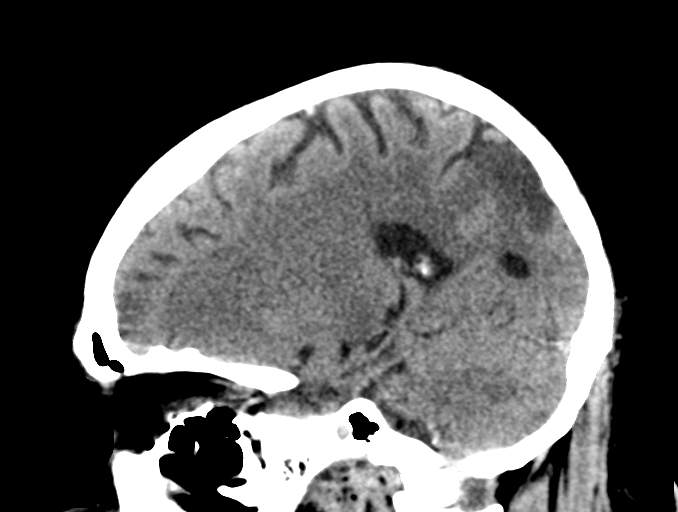

[15 of 47 positions shown; findings below may reference images not displayed]

FINDINGS: Brain: There is no acute intracranial hemorrhage mass effect no
definite new loss of gray-white differentiation. Multiple chronic
infarcts are identified including involvement right parietal,
temporal, and occipital lobes and left parietal and temporal lobes.
Ex vacuo dilatation of adjacent lateral ventricles. No
hydrocephalus. No extra-axial collection.

Vascular: No hyperdense vessel. There is intracranial
atherosclerotic calcification at the skull base.

Skull: Unremarkable.

Sinuses/Orbits: No acute abnormality.

Other: Mastoid air cells are clear.
IMPRESSION: There is no acute intracranial hemorrhage or evidence of acute
infarction.

Multiple chronic infarcts.

These results were communicated to Dr. BLAIN at [DATE] pmon
[DATE]by text page via the [employer] messaging system.

## 2020-07-20 IMAGING — CT CT ANGIO CHEST-ABD-PELV FOR DISSECTION W/ AND WO/W CM
2 of 7 series · 11 of 46 positions shown, 12 images · IV contrast (APPLIED)
Comparison: Radiograph [DATE], CT abdomen [DATE] CT chest
[DATE], CT abdomen pelvis [DATE]

CLINICAL DATA: Abdominal pain, dissection suspected

EXAM:
CT ANGIOGRAPHY CHEST, ABDOMEN AND PELVIS
TECHNIQUE: Non-contrast CT of the chest was initially obtained.

[Series 6: arterial · axial · arterial · 0.91mm/px · z∈[-786,-258]mm · 8 of 336 slices shown, 9 images]
[im 36/336  soft-tissue]
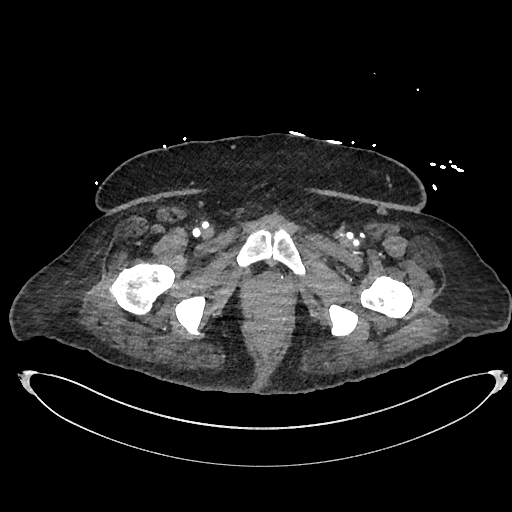
[im 36/336  bone]
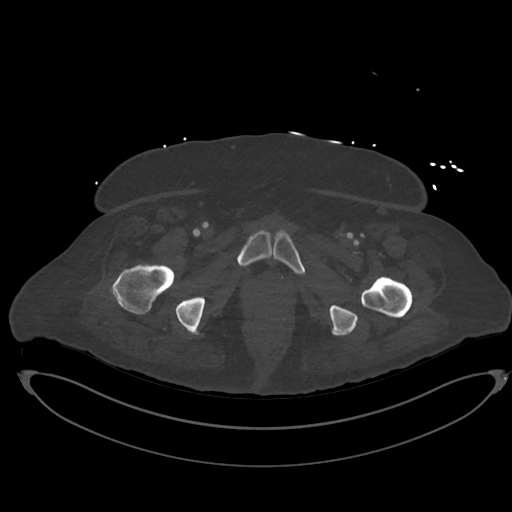
[im 71/336  soft-tissue]
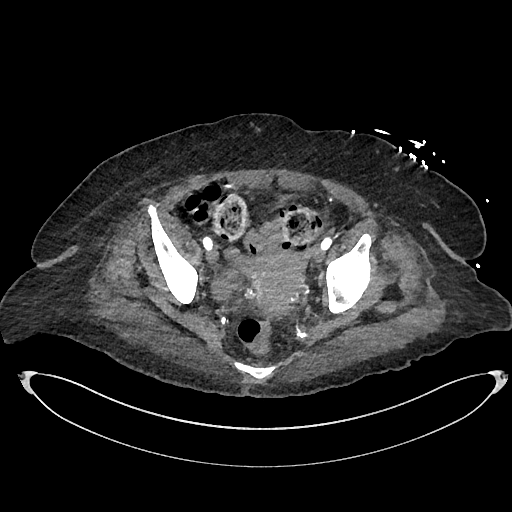
[im 106/336  soft-tissue]
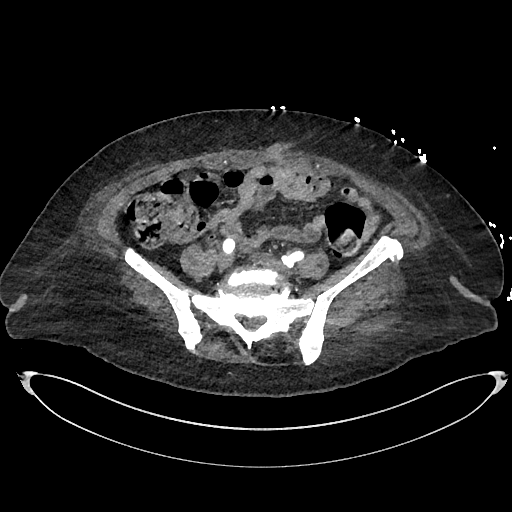
[im 142/336  soft-tissue]
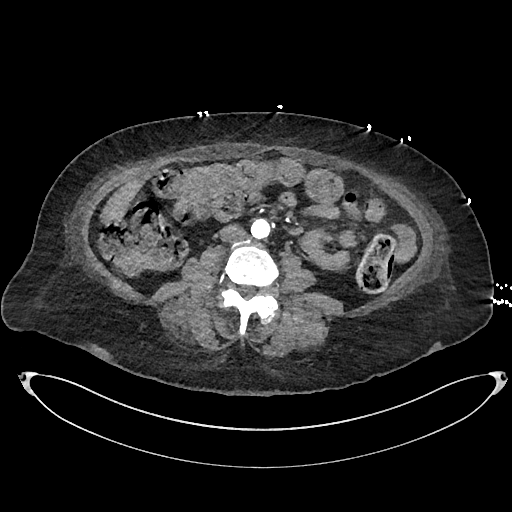
[im 194/336  soft-tissue]
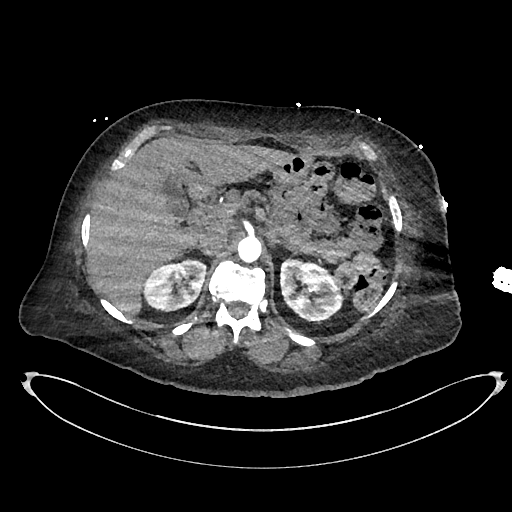
[im 230/336  soft-tissue]
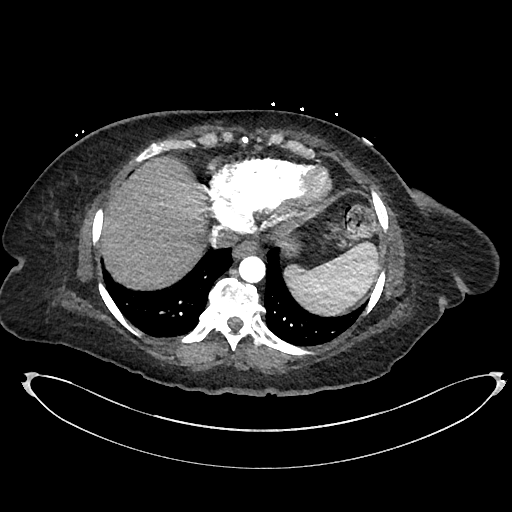
[im 265/336  soft-tissue]
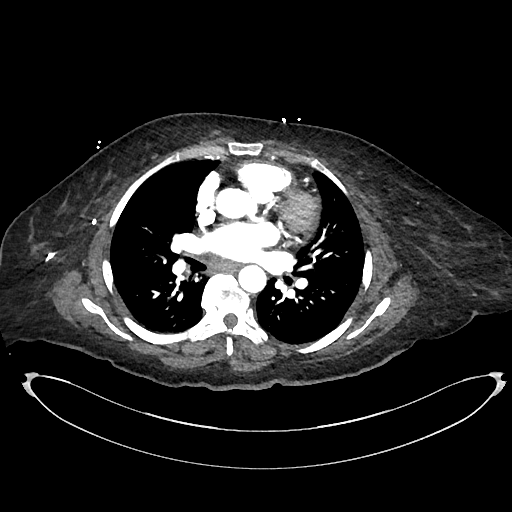
[im 300/336  soft-tissue]
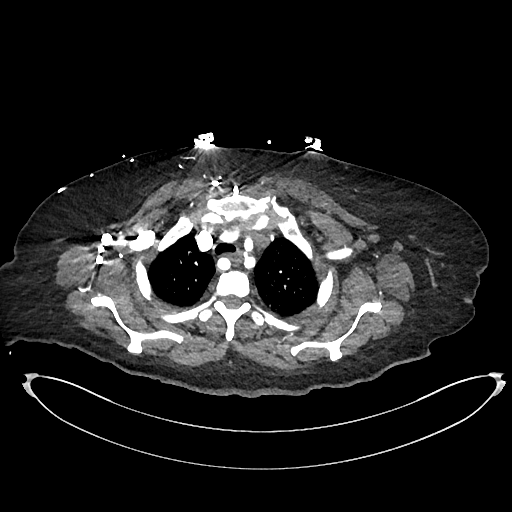

[Series 9: cor · coronal · 0.98mm/px · 3 of 148 slices shown]
[im 37/148  soft-tissue]
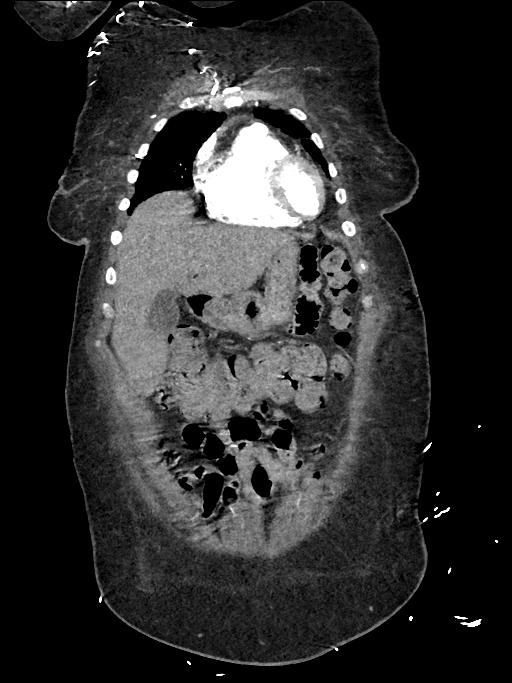
[im 74/148  soft-tissue]
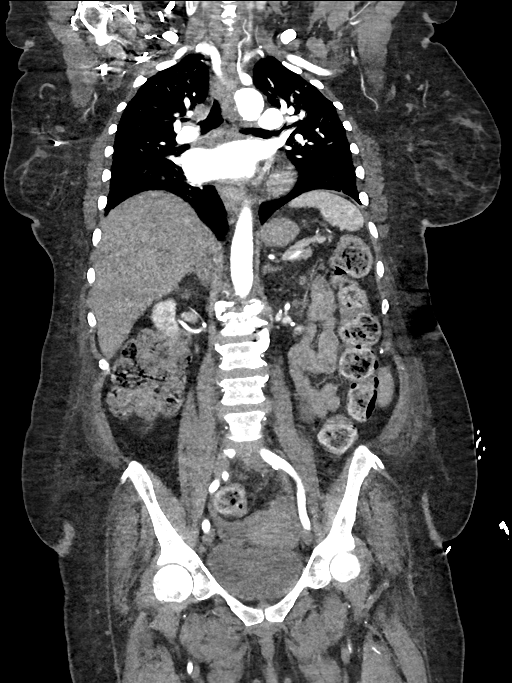
[im 111/148  soft-tissue]
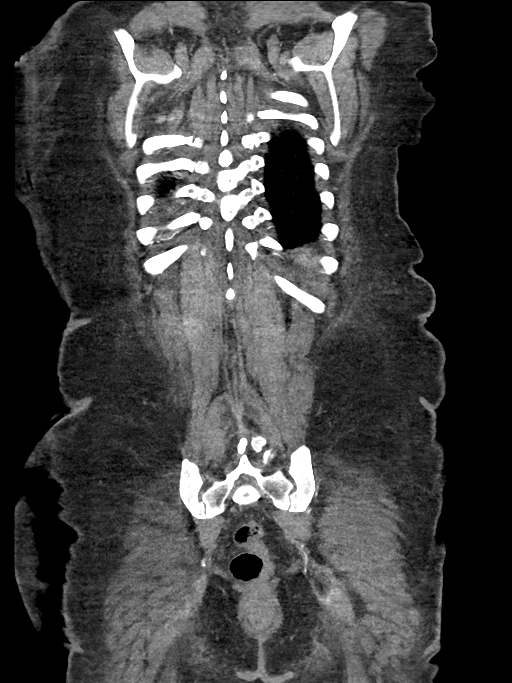

[11 of 46 positions shown; findings below may reference images not displayed]

Multidetector CT imaging through the chest, abdomen and pelvis was
performed using the standard protocol during bolus administration of
intravenous contrast. Multiplanar reconstructed images and MIPs were
obtained and reviewed to evaluate the vascular anatomy.

CONTRAST:  100mL OMNIPAQUE IOHEXOL 350 MG/ML SOLN
FINDINGS: CTA CHEST FINDINGS

Cardiovascular: Noncontrast CT of the chest performed initially
reveals an atherosclerotic aorta without abnormal plaque
displacement or hyperdense mural thickening. Postcontrast
administration there is satisfactory opacification of the aorta and
arterial branches. The aortic root is suboptimally assessed given
cardiac pulsation artifact. Atherosclerotic plaque within the normal
caliber aorta. No acute luminal abnormality of the imaged aorta. No
periaortic stranding or hemorrhage. The right common carotid than
left vertebral artery arises directly from the aortic arch with an
aberrant right subclavian artery. Minimal plaque in the proximal
great vessel origins but without significant occlusion or acute
vascular abnormality.

Normal heart size. No pericardial effusion.

Satisfactory opacification the pulmonary arteries to the segmental
level. No large central filling defects are identified. More distal
evaluation is limited by respiratory motion artifact which results
in some likely artifactual filling defect for instance in the right
lower lobe (6/90).

Reflux of contrast into the superficial veins of the right upper
extremity and chest wall with some questionable venous narrowing of
the right subclavian vein at the thoracic inlet.

Mediastinum/Nodes: No mediastinal fluid or gas. Elongated 1.5 by
cm nodule in the left thyroid gland. No acute abnormality of the
trachea or esophagus. No worrisome mediastinal, hilar or axillary
adenopathy.

Lungs/Pleura: Atelectatic changes which are likely exacerbated by
imaging during exhalation. Mild nonspecific mosaic attenuation may
also be related to underinflation. Motion artifact limits detection
of subtle abnormalities in the lungs. No clear focal consolidation,
convincing features of edema, pneumothorax or visible effusion. No
worrisome nodules or masses are visualized.

Musculoskeletal: Multilevel degenerative changes are present in the
imaged portions of the spine. Multilevel flowing anterior
osteophytosis, compatible with features of diffuse idiopathic
skeletal hyperostosis (DISH). No acute osseous abnormality or
suspicious osseous lesion. No worrisome abnormality of the soft
tissues of the chest wall. Some reflux of contrast is noted into the
superficial tissues of the right shoulder.

Review of the MIP images confirms the above findings.

CTA ABDOMEN AND PELVIS FINDINGS

VASCULAR

Aorta: Minimal atherosclerotic plaque. No significant stenosis or
occlusion. No acute luminal abnormality, specifically no evidence of
aneurysm, dissection or vasculitis.

Celiac: Ostial plaque without significant stenosis. Proximal
branches normally opacified. No evidence of aneurysm, dissection or
vasculitis.

SMA: Minimal ostial plaque. Proximal branches normally opacified. No
evidence of aneurysm, dissection or vasculitis.

Renals: Single renal arteries bilaterally. No aneurysm, dissection,
vasculitis or features of fibromuscular dysplasia.

IMA: Mild-to-moderate ostial plaque narrowing. Otherwise normally
opacified with normal branching. No evidence of aneurysm, dissection
or vasculitis.

Inflow: Atherosclerotic plaque predominantly within the common and
internal iliac artery branches bilaterally. No acute luminal
abnormality. No evidence of aneurysm, dissection or vasculitis.

Proximal outflow: The right proximal outflow system demonstrates
some minimal plaque but is otherwise widely patent without acute
luminal abnormality. The left proximal outflow system demonstrates
some abrupt tapering of the profundus femora is just beyond the
branching of the lateral circumflex femoral artery (6/309) with only
thread-like opacification more distally.

Veins: No obvious venous abnormality within the limitations of this
arterial phase study.

Review of the MIP images confirms the above findings.

NON-VASCULAR

Hepatobiliary: No worrisome focal liver lesions. Smooth liver
surface contour. Diffuse hepatic hypoattenuation, possibly
reflecting some hepatic steatosis versus artifactual related to
arterial phase of imaging. Calcified gallstones present within the
gallbladder lumen. No gallbladder wall thickening or pericholecystic
inflammation. No biliary ductal dilatation or visible intraductal
gallstones.

Pancreas: No pancreatic ductal dilatation or surrounding
inflammatory changes.

Spleen: Few normal clefts.  Normal size.  No concerning lesion.

Adrenals/Urinary Tract: Normal adrenal glands. Kidneys are normally
located with symmetric enhancement. No suspicious renal lesion,
urolithiasis or hydronephrosis. Mild bladder wall thickening is
noted with some faint perivesicular hazy stranding.

Stomach/Bowel: Distal esophagus, stomach and duodenal sweep are
unremarkable. No small bowel wall thickening or dilatation. No
evidence of obstruction. A normal appendix is visualized. No colonic
dilatation or wall thickening.

Lymphatic: No suspicious or enlarged lymph nodes in the included
lymphatic chains.

Reproductive: Normal appearance of the uterus and adnexal
structures.

Other: Mild body wall edema. No abdominopelvic free fluid or free
gas. No bowel containing hernias. Linear band of soft tissue
infiltration along the anterior abdominal wall the left upper
quadrant (6/174) likely reflect tract of prior percutaneous
gastrostomy.

Musculoskeletal: Multilevel degenerative changes are present in the
imaged portions of the spine. No acute osseous abnormality or
suspicious osseous lesion.

Review of the MIP images confirms the above findings.
IMPRESSION: Vascular:

1. No evidence of acute aortic syndrome.
2. Aberrant right subclavian artery. Left vertebral artery and right
common carotid arise directly from the arch.
3.  Aortic Atherosclerosis ([XK]-[XK]).
4. Minimal plaque narrowing of the celiac axis with more
mild-to-moderate narrowing at the IMA origin.
5. Abrupt tapering of the left deep femoral artery just beyond the
origin of the lateral circumflex femoral artery with only
thread-like opacification more distally within the margins of
imaging.
6. No large central pulmonary artery filling defects though more
distal evaluation is limited by a non tailored technique and
extensive respiratory motion.

Nonvascular:

1. Atelectasis with nonspecific mosaic attenuation in the lungs,
likely related to underinflation/imaging during exhalation for
angiography.
2. Mild bladder wall thickening with some faint perivesicular hazy
stranding, possibly related to underdistention though recommend
correlation with urinalysis to exclude cystitis.
3. Cholelithiasis without evidence of acute cholecystitis.
4. Hepatic hypoattenuation, likely hepatic steatosis versus contrast
timing affect.
5. Linear band of soft tissue infiltration along the anterior
abdominal wall the left upper quadrant likely reflects track of
prior percutaneous gastrostomy. Correlate with surgical history.

## 2020-07-20 MED ORDER — ALUM & MAG HYDROXIDE-SIMETH 200-200-20 MG/5ML PO SUSP
30.0000 mL | Freq: Once | ORAL | Status: AC
  Administered 2020-07-20: 30 mL via ORAL
  Filled 2020-07-20: qty 30

## 2020-07-20 MED ORDER — HYDRALAZINE HCL 20 MG/ML IJ SOLN
5.0000 mg | Freq: Once | INTRAMUSCULAR | Status: AC
  Administered 2020-07-20: 5 mg via INTRAVENOUS
  Filled 2020-07-20: qty 1

## 2020-07-20 MED ORDER — CLONIDINE HCL 0.2 MG PO TABS
0.2000 mg | ORAL_TABLET | Freq: Once | ORAL | Status: DC
  Filled 2020-07-20: qty 1

## 2020-07-20 MED ORDER — SODIUM CHLORIDE 0.9% FLUSH
3.0000 mL | Freq: Once | INTRAVENOUS | Status: AC
Start: 2020-07-20 — End: 2020-08-08
  Administered 2020-08-08: 3 mL via INTRAVENOUS

## 2020-07-20 MED ORDER — IOHEXOL 350 MG/ML SOLN
100.0000 mL | Freq: Once | INTRAVENOUS | Status: AC | PRN
  Administered 2020-07-20: 100 mL via INTRAVENOUS

## 2020-07-20 NOTE — ED Triage Notes (Signed)
Pt BIB [facility]. Initial call out chest pain. In route pt given 324 ASA. On EMS arrival noted pt altered from baseline, nonverbal, staring into space, unable to follow commands. LKN 1445. Code stroke initiated.   On ED arrival pt verbal. Intermittent following commands. C/o abdominal pain.

## 2020-07-20 NOTE — ED Notes (Signed)
Attempted to ambulate pt. Pt not listening to verbal commands or making eye contact. Unable to ambulate at this time.
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
## 2020-07-20 NOTE — ED Notes (Signed)
Pt speaking out loud to daughter who is not present in room, pt unable to follow commands per tech requesting pt to walk. Pt responding slowly to answers to commands, although able to follow up through on simple commands and request. Provided daughter update, per daughter pt reports pt has been off psychiatric medications x2 weeks and reports mentally pt is suffering from lack of medication.

## 2020-07-20 NOTE — ED Notes (Signed)
Code Stroke Cancelled at 15:51

## 2020-07-20 NOTE — ED Notes (Signed)
Pt sitting up in bed and having coherent conversation on phone. Pt speaking in complete sentences and appears in NAD.

## 2020-07-20 NOTE — Code Documentation (Signed)
Stroke Response Nurse Documentation Code Documentation  Susan [name] is a 59 y.o. female arriving to [facility] ED via [facility] on 07/20/20 with past medical hx of anxiety, depression, diabetes, GERD, HIV infection, hypercholesteremia, hypertension, migraine, nicotine abuse, schizophrenia, seizure, substance abuse, stroke.   Code stroke was activated by EMS. Patient from home where she was LKW at 1445 and now complaining of altered mental status, facial droop and chest pain. Patient intermittently responds to questions. Repeats "baby" and "you're so cute". Able to state she has pain in her abdomen.   Stroke team at the bedside on patient arrival. Labs drawn and patient cleared for CT by Dr. Ralene Bathe. Patient to CT with team. NIHSS 5, see documentation for details and code stroke times. Patient with disoriented, not following commands and Global aphasia  on exam. The following imaging was completed: CT. Patient is not a candidate for tPA due to stroke not suspected. Code stroke cancelled 1551. Bedside handoff with ED RN [place].    Leverne Humbles Stroke Response RN
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
## 2020-07-20 NOTE — ED Notes (Signed)
Aleicia, Kenagy Daughter   [phone]   Would like an  Update

## 2020-07-21 ENCOUNTER — Observation Stay (HOSPITAL_COMMUNITY): Payer: Medicare Other

## 2020-07-21 ENCOUNTER — Encounter (HOSPITAL_COMMUNITY): Payer: Self-pay | Admitting: Internal Medicine

## 2020-07-21 DIAGNOSIS — E1165 Type 2 diabetes mellitus with hyperglycemia: Secondary | ICD-10-CM

## 2020-07-21 DIAGNOSIS — B20 Human immunodeficiency virus [HIV] disease: Secondary | ICD-10-CM | POA: Diagnosis not present

## 2020-07-21 DIAGNOSIS — K219 Gastro-esophageal reflux disease without esophagitis: Secondary | ICD-10-CM | POA: Diagnosis not present

## 2020-07-21 DIAGNOSIS — Z794 Long term (current) use of insulin: Secondary | ICD-10-CM

## 2020-07-21 DIAGNOSIS — I10 Essential (primary) hypertension: Secondary | ICD-10-CM

## 2020-07-21 DIAGNOSIS — F250 Schizoaffective disorder, bipolar type: Secondary | ICD-10-CM

## 2020-07-21 DIAGNOSIS — G9341 Metabolic encephalopathy: Secondary | ICD-10-CM | POA: Diagnosis not present

## 2020-07-21 LAB — LIPID PANEL
Cholesterol: 226 mg/dL — ABNORMAL HIGH (ref 0–200)
HDL: 47 mg/dL (ref >40)
LDL Cholesterol: 161 mg/dL — ABNORMAL HIGH (ref 0–99)
Total CHOL/HDL Ratio: 4.8 RATIO
Triglycerides: 90 mg/dL (ref <150)
VLDL: 18 mg/dL (ref 0–40)

## 2020-07-21 LAB — FOLATE: Folate: 8.1 ng/mL (ref >5.9)

## 2020-07-21 LAB — URINE CULTURE

## 2020-07-21 LAB — MRSA PCR SCREENING: MRSA by PCR: POSITIVE — AB

## 2020-07-21 LAB — VITAMIN B12: Vitamin B-12: 256 pg/mL (ref 180–914)

## 2020-07-21 LAB — TSH: TSH: 0.867 u[IU]/mL (ref 0.350–4.500)

## 2020-07-21 LAB — HEMOGLOBIN A1C
Hgb A1c MFr Bld: 7.7 % — ABNORMAL HIGH (ref 4.8–5.6)
Mean Plasma Glucose: 174.29 mg/dL

## 2020-07-21 LAB — CBG MONITORING, ED: Glucose-Capillary: 232 mg/dL — ABNORMAL HIGH (ref 70–99)

## 2020-07-21 MED ORDER — DOXEPIN HCL 50 MG PO CAPS: 50.0000 mg | ORAL_CAPSULE | Freq: Every day | ORAL | Status: DC

## 2020-07-21 MED ORDER — STERILE WATER FOR INJECTION IJ SOLN
INTRAMUSCULAR | Status: AC
  Administered 2020-07-21: 2 mL
  Filled 2020-07-21: qty 10

## 2020-07-21 MED ORDER — BICTEGRAVIR-EMTRICITAB-TENOFOV 50-200-25 MG PO TABS
1.0000 | ORAL_TABLET | Freq: Every day | ORAL | Status: DC
  Administered 2020-07-30: 1 via ORAL
  Filled 2020-07-21 (×10): qty 1

## 2020-07-21 MED ORDER — BUSPIRONE HCL 15 MG PO TABS
15.0000 mg | ORAL_TABLET | Freq: Three times a day (TID) | ORAL | Status: DC | PRN
  Administered 2020-07-30: 15 mg via ORAL
  Filled 2020-07-21: qty 1

## 2020-07-21 MED ORDER — HALOPERIDOL LACTATE 5 MG/ML IJ SOLN: 2.0000 mg | INTRAMUSCULAR | Status: DC | PRN

## 2020-07-21 MED ORDER — DOXEPIN HCL 50 MG PO CAPS
50.0000 mg | ORAL_CAPSULE | Freq: Every day | ORAL | Status: DC
  Filled 2020-07-21 (×9): qty 1

## 2020-07-21 MED ORDER — ALBUTEROL SULFATE HFA 108 (90 BASE) MCG/ACT IN AERS
2.0000 | INHALATION_SPRAY | RESPIRATORY_TRACT | Status: DC | PRN
  Filled 2020-07-21: qty 6.7

## 2020-07-21 MED ORDER — PANTOPRAZOLE SODIUM 40 MG IV SOLR
40.0000 mg | INTRAVENOUS | Status: DC
  Administered 2020-07-21 – 2020-07-30 (×10): 40 mg via INTRAVENOUS
  Filled 2020-07-21 (×10): qty 40

## 2020-07-21 MED ORDER — LORAZEPAM 2 MG/ML IJ SOLN
INTRAMUSCULAR | Status: AC
  Filled 2020-07-21: qty 1

## 2020-07-21 MED ORDER — HALOPERIDOL 1 MG PO TABS
1.0000 mg | ORAL_TABLET | ORAL | Status: DC | PRN
  Administered 2020-07-30: 1 mg via ORAL
  Filled 2020-07-21 (×2): qty 1

## 2020-07-21 MED ORDER — CLONIDINE HCL 0.2 MG/24HR TD PTWK
0.2000 mg | MEDICATED_PATCH | TRANSDERMAL | Status: DC
  Administered 2020-07-21: 0.2 mg via TRANSDERMAL
  Filled 2020-07-21: qty 1

## 2020-07-21 MED ORDER — ONDANSETRON HCL 4 MG/2ML IJ SOLN: 4.0000 mg | Freq: Four times a day (QID) | INTRAMUSCULAR | Status: DC | PRN

## 2020-07-21 MED ORDER — OLANZAPINE 10 MG IM SOLR
5.0000 mg | Freq: Every day | INTRAMUSCULAR | Status: DC
  Administered 2020-07-21 – 2020-07-22 (×2): 5 mg via INTRAMUSCULAR
  Filled 2020-07-21 (×3): qty 10

## 2020-07-21 MED ORDER — ACETAMINOPHEN 650 MG RE SUPP: 650.0000 mg | RECTAL | Status: DC | PRN

## 2020-07-21 MED ORDER — ENOXAPARIN SODIUM 40 MG/0.4ML ~~LOC~~ SOLN
40.0000 mg | Freq: Every day | SUBCUTANEOUS | Status: DC
  Administered 2020-07-21 – 2020-08-17 (×28): 40 mg via SUBCUTANEOUS
  Filled 2020-07-21 (×28): qty 0.4

## 2020-07-21 MED ORDER — LORAZEPAM 2 MG/ML IJ SOLN
2.0000 mg | INTRAMUSCULAR | Status: AC
  Administered 2020-07-21: 2 mg via INTRAVENOUS
  Filled 2020-07-21: qty 1

## 2020-07-21 MED ORDER — SERTRALINE HCL 50 MG PO TABS
50.0000 mg | ORAL_TABLET | Freq: Every day | ORAL | Status: DC
  Filled 2020-07-21: qty 1

## 2020-07-21 MED ORDER — HALOPERIDOL 1 MG PO TABS: 2.0000 mg | ORAL_TABLET | ORAL | Status: DC | PRN

## 2020-07-21 MED ORDER — HYDROXYZINE HCL 25 MG PO TABS
50.0000 mg | ORAL_TABLET | Freq: Three times a day (TID) | ORAL | Status: DC
  Filled 2020-07-21 (×7): qty 2

## 2020-07-21 MED ORDER — ROSUVASTATIN CALCIUM 20 MG PO TABS
20.0000 mg | ORAL_TABLET | Freq: Every day | ORAL | Status: DC
  Filled 2020-07-21: qty 1

## 2020-07-21 MED ORDER — HALOPERIDOL LACTATE 5 MG/ML IJ SOLN
1.0000 mg | INTRAMUSCULAR | Status: DC | PRN
  Administered 2020-07-21 – 2020-07-24 (×4): 1 mg via INTRAMUSCULAR
  Filled 2020-07-21 (×5): qty 1

## 2020-07-21 MED ORDER — ZIPRASIDONE MESYLATE 20 MG IM SOLR
INTRAMUSCULAR | Status: AC
  Filled 2020-07-21: qty 20

## 2020-07-21 MED ORDER — AMLODIPINE BESYLATE 10 MG PO TABS
10.0000 mg | ORAL_TABLET | Freq: Every day | ORAL | Status: DC
  Filled 2020-07-21: qty 1

## 2020-07-21 MED ORDER — CARVEDILOL 3.125 MG PO TABS
3.1250 mg | ORAL_TABLET | Freq: Two times a day (BID) | ORAL | Status: DC
  Filled 2020-07-21 (×2): qty 1

## 2020-07-21 MED ORDER — INSULIN GLARGINE 100 UNIT/ML ~~LOC~~ SOLN
20.0000 [IU] | Freq: Every day | SUBCUTANEOUS | Status: DC
  Administered 2020-07-21: 20 [IU] via SUBCUTANEOUS
  Filled 2020-07-21: qty 0.2

## 2020-07-21 MED ORDER — CHLORHEXIDINE GLUCONATE CLOTH 2 % EX PADS
6.0000 | MEDICATED_PAD | Freq: Every day | CUTANEOUS | Status: AC
  Administered 2020-07-21 – 2020-07-25 (×4): 6 via TOPICAL

## 2020-07-21 MED ORDER — QUETIAPINE FUMARATE 200 MG PO TABS
200.0000 mg | ORAL_TABLET | Freq: Two times a day (BID) | ORAL | Status: DC
  Filled 2020-07-21: qty 2
  Filled 2020-07-21 (×2): qty 1
  Filled 2020-07-21 (×2): qty 2
  Filled 2020-07-21 (×9): qty 1
  Filled 2020-07-21: qty 2
  Filled 2020-07-21 (×2): qty 1
  Filled 2020-07-21: qty 2
  Filled 2020-07-21 (×4): qty 1
  Filled 2020-07-21: qty 2
  Filled 2020-07-21: qty 1

## 2020-07-21 MED ORDER — TRIAMTERENE-HCTZ 37.5-25 MG PO TABS
1.0000 | ORAL_TABLET | Freq: Every day | ORAL | Status: DC
  Filled 2020-07-21 (×2): qty 1

## 2020-07-21 MED ORDER — MUPIROCIN 2 % EX OINT
1.0000 "application " | TOPICAL_OINTMENT | Freq: Two times a day (BID) | CUTANEOUS | Status: AC
  Administered 2020-07-21 – 2020-07-26 (×10): 1 via NASAL
  Filled 2020-07-21 (×2): qty 22

## 2020-07-21 MED ORDER — ACETAMINOPHEN 325 MG PO TABS: 650.0000 mg | ORAL_TABLET | ORAL | Status: DC | PRN

## 2020-07-21 MED ORDER — STROKE: EARLY STAGES OF RECOVERY BOOK
Freq: Once | Status: DC
  Filled 2020-07-21: qty 1

## 2020-07-21 MED ORDER — ASPIRIN 81 MG PO CHEW
81.0000 mg | CHEWABLE_TABLET | Freq: Every day | ORAL | Status: DC
  Administered 2020-07-21: 81 mg via ORAL
  Filled 2020-07-21: qty 1

## 2020-07-21 MED ORDER — HYDRALAZINE HCL 20 MG/ML IJ SOLN
10.0000 mg | Freq: Four times a day (QID) | INTRAMUSCULAR | Status: DC | PRN
  Administered 2020-07-23 – 2020-07-24 (×3): 10 mg via INTRAVENOUS
  Filled 2020-07-21 (×3): qty 1

## 2020-07-21 MED ORDER — ZIPRASIDONE MESYLATE 20 MG IM SOLR
20.0000 mg | Freq: Once | INTRAMUSCULAR | Status: AC
  Administered 2020-07-21: 20 mg via INTRAMUSCULAR

## 2020-07-21 MED ORDER — UMECLIDINIUM-VILANTEROL 62.5-25 MCG/INH IN AEPB
1.0000 | INHALATION_SPRAY | Freq: Every day | RESPIRATORY_TRACT | Status: DC
  Filled 2020-07-21: qty 14

## 2020-07-21 MED ORDER — ACETAMINOPHEN 160 MG/5ML PO SOLN: 650.0000 mg | ORAL | Status: DC | PRN

## 2020-07-21 MED ORDER — BENZTROPINE MESYLATE 1 MG PO TABS: 0.5000 mg | ORAL_TABLET | Freq: Every day | ORAL | Status: DC

## 2020-07-21 MED ORDER — THIAMINE HCL 100 MG/ML IJ SOLN
Freq: Once | INTRAVENOUS | Status: AC
  Filled 2020-07-21: qty 1000

## 2020-07-21 MED ORDER — LORAZEPAM 2 MG/ML IJ SOLN
2.0000 mg | Freq: Once | INTRAMUSCULAR | Status: AC
  Administered 2020-07-21: 2 mg via INTRAVENOUS

## 2020-07-21 NOTE — Progress Notes (Signed)
  Speech Language Pathology Patient Details Name: Susan [name] MRN: [id number] DOB: 1961-03-01 Today's Date: 07/21/2020 Time:  -     Pt known to therapist from prior admissions. Speech-language-cognition ordered as part of the stroke order set. Head scan was negative for stroke. She has a history of significant schizophrenia and has been significantly agitated. Assessment does not appear warranted at this time.                 GO                [name] 07/21/2020, 4:05 PM

## 2020-07-21 NOTE — ED Notes (Signed)
Pt had loud outburst in room screaming and moving around in bed. Pt had reposition and bed, cardiac leads reapplied at this time. Pt stable, but unable verbalize understanding at this time.

## 2020-07-21 NOTE — Plan of Care (Signed)
°  Problem: Education: Goal: Knowledge of General Education information will improve Description: Including pain rating scale, medication(s)/side effects and non-pharmacologic comfort measures Outcome: Not Progressing   Problem: Health Behavior/Discharge Planning: Goal: Ability to manage health-related needs will improve Outcome: Not Progressing   Problem: Nutrition: Goal: Adequate nutrition will be maintained Outcome: Not Progressing   
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
## 2020-07-21 NOTE — Progress Notes (Signed)
PROGRESS NOTE    Susan [name]  KXF:[id number] DOB: [birth year] DOA: 07/20/2020 PCP: Hoyt Koch, MD    Brief Narrative:  Susan [name] was admitted to the hospital with a working diagnosis of acute metabolic encephalopathy.  59 year old female past medical history for schizoaffective disorder, bipolar, seizure disorder, type 2 diabetes mellitus, polysubstance abuse, HIV, history of CVA and a recent hospitalization for respiratory failure and encephalopathy March to May 2021, that required trach and PEG.  Patient was brought to the hospital due to severe agitation, approximately for about 24 hours, apparently patient has stopped taking her psychotropic medications for several weeks. On her initial physical examination blood pressure 157/93, heart rate 112, respiratory rate 32, temperature 98, oxygen saturation 94%, her lungs were clear to auscultation bilaterally, heart S1-S2, present rhythm, soft abdomen, no lower extremity edema.  Dry mucous membranes, extreme agitation, moving all 4 extremities spontaneously.  Not following commands or answering questions. Sodium 139, potassium 3.8, chloride 106, bicarb 24, glucose 188, BUN 12, creatinine 0.93, AST 12, ALT 10, white count 7.6, hemoglobin 16.1, hematocrit 48.6, platelets 240.  Urinalysis negative for infection.  SARS COVID-19 negative. Head CT with no acute changes.  Multiple chronic infarcts. Chest radiograph no infiltrates. CT chest, abdomen pelvis, negative for dissection. EKG 53 bpm, normal axis, normal intervals, sinus rhythm, positive LVH, no ST segment T wave changes.  Assessment & Plan:   Principal Problem:   Altered mental status Active Problems:   Essential hypertension   Schizoaffective disorder, bipolar type (HCC)   GERD (gastroesophageal reflux disease)   Uncontrolled type 2 diabetes mellitus with hyperglycemia, with long-term current use of insulin (HCC)   Human immunodeficiency virus (HIV) disease ([place])   1.  Acute metabolic encephalopathy, with delirium and possible psychosis. Patient has been very agitated and combative, now on physical restrain 4 point and one to one sitter at the bedside.  She is not accepting oral medications.  Add haldol as needed IM and will schedule olanzapine at night. IV fluids with balanced electrolyte solutions, add thiamine and multivitamins.  Aspiration and fall precautions, for now will keep NPO.   No meningeal signs, no fever or leukocytosis, doubt patient has bacterial meningitis. She is very agitated and likely will be very difficult to obtain lumbar puncture.  Hold on brain MRI and EEG, patient not cooperative.   2. HTN. Blood pressure has been uncontrolled due to agitation, will continue close monitoring add hydrazine for systolic more than 696 mmHg.   Continue with clonidine patch.  3. Uncontrolled T2DM. Continue close monitoring of glucose, patient is NPO for now due to encephalopathy   Hold on basal insulin.   4. HIV. Unable to ger her antiretroviral medications due to cognitive impairment.   5. GERD. Continue pantoprazole.   6. Schizophrenia. At home on benztropine, buspirone, doxepin, hydroxyzine, quetiapine and sertraline.   Not able to take po due to severe cognitive impairment.   Patient continue to be at high risk for worsening encephalopathy   Status is: Observation  The patient will require care spanning > 2 midnights and should be moved to inpatient because: IV treatments appropriate due to intensity of illness or inability to take PO  Dispo: The patient is from: Home              Anticipated d/c is to: Home              Anticipated d/c date is: > 3 days  Patient currently is not medically stable to d/c.   DVT prophylaxis: Enoxaparin   Code Status:   full  Family Communication:  I spoke over the phone with the patient's daughter about patient's  condition, plan of care, prognosis and all questions were addressed.      Subjective: Patient has been agitated, screening and combative, not following commands, has been refusing to take medications po, currently on restrains.   Objective: Vitals:   07/21/20 0115 07/21/20 0130 07/21/20 0230 07/21/20 0822  BP: (!) 153/120 (!) 157/93 (!) 178/95 (!) 165/97  Pulse: (!) 112 98 88 100  Resp: (!) 32 20 (!) 22   Temp:  98 F (36.7 C) 98.4 F (36.9 C) 98 F (36.7 C)  TempSrc:  Axillary Oral Oral  SpO2: 100% 100% 94% 98%   No intake or output data in the 24 hours ending 07/21/20 1513 There were no vitals filed for this visit.  Examination:   General: deconditioned  Neurology: combative and agitated, not following commands or interactive.  E ENT: no pallor, no icterus, oral mucosa moist Cardiovascular: No JVD. S1-S2 present, rhythmic, no gallops, rubs, or murmurs. No lower extremity edema. Pulmonary: positive breath sounds bilaterally, adequate air movement, no wheezing, rhonchi or rales. Gastrointestinal. Abdomen soft and non tender Skin. No rashes Musculoskeletal: no joint deformities     Data Reviewed: I have personally reviewed following labs and imaging studies  CBC: Recent Labs  Lab 07/20/20 1529 07/20/20 1545  WBC 7.6  --   NEUTROABS 3.7  --   HGB 16.1* 16.3*  HCT 48.6* 48.0*  MCV 86.8  --   PLT 240  --    Basic Metabolic Panel: Recent Labs  Lab 07/20/20 1529 07/20/20 1545  NA 139 141  K 3.8 3.8  CL 106 105  CO2 24  --   GLUCOSE 188* 183*  BUN 12 13  CREATININE 0.93 0.80  CALCIUM 8.9  --    GFR: CrCl cannot be calculated (Unknown ideal weight.). Liver Function Tests: Recent Labs  Lab 07/20/20 1529  AST 12*  ALT 10  ALKPHOS 111  BILITOT 0.4  PROT 6.1*  ALBUMIN 3.0*   Recent Labs  Lab 07/20/20 1529  LIPASE 18   Recent Labs  Lab 07/20/20 1537  AMMONIA 53*   Coagulation Profile: Recent Labs  Lab 07/20/20 1529  INR 0.9   Cardiac Enzymes: No results for input(s): CKTOTAL, CKMB, CKMBINDEX, TROPONINI  in the last 168 hours. BNP (last 3 results) No results for input(s): PROBNP in the last 8760 hours. HbA1C: Recent Labs    07/21/20 0238  HGBA1C 7.7*   CBG: Recent Labs  Lab 07/20/20 1533 07/21/20 0043  GLUCAP 185* 232*   Lipid Profile: Recent Labs    07/21/20 0238  CHOL 226*  HDL 47  LDLCALC 161*  TRIG 90  CHOLHDL 4.8   Thyroid Function Tests: Recent Labs    07/21/20 0238  TSH 0.867   Anemia Panel: Recent Labs    07/21/20 0238  VITAMINB12 256  FOLATE 8.1      Radiology Studies: I have reviewed all of the imaging during this hospital visit personally     Scheduled Meds: .  stroke: mapping our early stages of recovery book   Does not apply Once  . amLODipine  10 mg Oral Daily  . aspirin  81 mg Oral Daily  . benztropine  0.5 mg Oral QHS  . bictegravir-emtricitabine-tenofovir AF  1 tablet Oral Daily  . carvedilol  3.125  mg Oral BID WC  . Chlorhexidine Gluconate Cloth  6 each Topical Q0600  . cloNIDine  0.2 mg Transdermal Weekly  . cloNIDine  0.2 mg Oral Once  . doxepin  50 mg Oral QHS  . enoxaparin (LOVENOX) injection  40 mg Subcutaneous Daily  . hydrOXYzine  50 mg Oral TID  . insulin glargine  20 Units Subcutaneous Daily  . mupirocin ointment  1 application Nasal BID  . pantoprazole (PROTONIX) IV  40 mg Intravenous Q24H  . QUEtiapine  200 mg Oral BID  . rosuvastatin  20 mg Oral Daily  . sertraline  50 mg Oral QHS  . sodium chloride flush  3 mL Intravenous Once  . triamterene-hydrochlorothiazide  1 tablet Oral Daily  . umeclidinium-vilanterol  1 puff Inhalation Daily   Continuous Infusions:   LOS: 0 days        Shloma Roggenkamp Gerome Apley, MD

## 2020-07-21 NOTE — Progress Notes (Signed)
Inpatient Diabetes Program Recommendations  AACE/ADA: New Consensus Statement on Inpatient Glycemic Control (2015)  Target Ranges:  Prepandial:   less than 140 mg/dL      Peak postprandial:   less than 180 mg/dL (1-2 hours)      Critically ill patients:  140 - 180 mg/dL   Lab Results  Component Value Date   GLUCAP 232 (H) 07/21/2020   HGBA1C 7.7 (H) 07/21/2020    Review of Glycemic Control Results for Susan [name], Susan [name] (MRN [id number]) as of 07/21/2020 13:14  Ref. Range 07/20/2020 15:33 07/21/2020 00:43  Glucose-Capillary Latest Ref Range: 70 - 99 mg/dL 185 (H) 232 (H)   Diabetes history: DM2 Outpatient Diabetes medications: Lantus 20 units bid + Metformin 1 gm bid Current orders for Inpatient glycemic control: Lantus 20 units qd  Inpatient Diabetes Program Recommendations:   -Add GlycemicControl Order Set with Novolog correction 0-6 units q 4 hrs. While NPO. Secure chat sent to Dr. Cathlean Sauer.  Thank you, Nani Gasser. Mase Dhondt, RN, MSN, CDE  Diabetes Coordinator Inpatient Glycemic Control Team Team Pager [phone] (8am-5pm) 07/21/2020 1:19 PM

## 2020-07-21 NOTE — Evaluation (Signed)
Physical Therapy Evaluation Patient Details Name: Susan [name] MRN: [id number] DOB: [birth year] Today's Date: 07/21/2020   History of Present Illness  59 year old female with past medical history of schizoaffective disorder/bipolar type, seizure disorder (?),  Diabetes mellitus type 2, HIV, history of polysubstance abuse,  right PCA/MCA stroke 03/2020, notable prolonged hospitalization from 3/10 to 01/09/2020 for sepsis with respiratory failure and encephalopathy status post trach/PEG and removal. Pt admitted to ED On 11/17 for AMS, agitation.  Clinical Impression   Pt presents with impaired cognition with severe agitation with stimulation, difficulty performing mobility tasks, and decreased activity tolerance. Pt to benefit from acute PT to address deficits. Pt resistant to all mobility, requiring total +2 to get to and from EOB this day. Pt is known to this PT from a prior visit, and pt is far from baseline mobility status. Mobility significantly limited by psych presentation. PT to progress mobility as tolerated, and will continue to follow acutely.      Follow Up Recommendations Other (comment) (inpatient psych)    Equipment Recommendations  Other (comment) (TBD)    Recommendations for Other Services       Precautions / Restrictions Precautions Precautions: Other (comment);Fall (AMS, can be aggresive) Restrictions Weight Bearing Restrictions: No      Mobility  Bed Mobility Overal bed mobility: Needs Assistance Bed Mobility: Supine to Sit;Sit to Supine     Supine to sit: Total assist;+2 for physical assistance;+2 for safety/equipment Sit to supine: Total assist;+2 for physical assistance;+2 for safety/equipment   General bed mobility comments: total A, not following commands to help - actively resisting any attempt to help her to EOB    Transfers                 General transfer comment: not safe to attempt this session  Ambulation/Gait                 Stairs            Wheelchair Mobility    Modified Rankin (Stroke Patients Only)       Balance Overall balance assessment: Needs assistance Sitting-balance support: Bilateral upper extremity supported;Feet supported Sitting balance-Leahy Scale: Zero Sitting balance - Comments: actively pushing backwards in sitting                                     Pertinent Vitals/Pain Pain Assessment: Faces Pain Score: 0-No pain Faces Pain Scale: Hurts little more Pain Location: generalized (suspect due to psych, difficult to discern) Pain Descriptors / Indicators: Grimacing;Crying Pain Intervention(s): Limited activity within patient's tolerance;Monitored during session;Repositioned    Home Living Family/patient expects to be discharged to:: Private residence Living Arrangements: Children Available Help at Discharge: Family;Available 24 hours/day Type of Home: House Home Access: Stairs to enter Entrance Stairs-Rails: Psychiatric nurse of Steps: several Home Layout: One level Home Equipment: Walker - 4 wheels;Cane - single point;Tub bench Additional Comments: not able to share any information, not following commands. info gleaned from previous admission    Prior Function Level of Independence: Needs assistance         Comments: unsure - and Pt unable to report     Hand Dominance        Extremity/Trunk Assessment   Upper Extremity Assessment Upper Extremity Assessment: Difficult to assess due to impaired cognition    Lower Extremity Assessment Lower Extremity Assessment: Difficult to assess due to impaired cognition (full  sagittal plane PROM LEs assessed via hip and knee flex/ext)       Communication   Communication: Other (comment) (not answering any questions right now - can vocalize)  Cognition Arousal/Alertness: Awake/alert;Lethargic (waxed and wained throughout session) Behavior During Therapy: Restless;Agitated Overall  Cognitive Status: No family/caregiver present to determine baseline cognitive functioning Area of Impairment: Orientation;Attention;Following commands;Awareness;Problem solving                 Orientation Level: Disoriented to;Place;Time;Situation (did state her name was Susan [name]) Current Attention Level: Focused   Following Commands:  (does not follow commands at this time)   Awareness: Intellectual Problem Solving:  (no problem solving ability currently) General Comments: Pt not responding at all to any commands, attempt to calm Pt - attempted music, reassurance, white noise      General Comments General comments (skin integrity, edema, etc.): from previous admission, Pt liked "The temptations" music    Exercises     Assessment/Plan    PT Assessment Patient needs continued PT services  PT Problem List Decreased strength;Decreased mobility;Decreased safety awareness;Decreased balance;Decreased activity tolerance;Decreased cognition       PT Treatment Interventions DME instruction;Therapeutic activities;Gait training;Therapeutic exercise;Patient/family education;Balance training;Functional mobility training;Neuromuscular re-education    PT Goals (Current goals can be found in the Care Plan section)  Acute Rehab PT Goals Patient Stated Goal: none stated PT Goal Formulation: Patient unable to participate in goal setting Time For Goal Achievement: 08/04/20 Potential to Achieve Goals: Fair    Frequency Min 2X/week   Barriers to discharge        Co-evaluation PT/OT/SLP Co-Evaluation/Treatment: Yes Reason for Co-Treatment: For patient/therapist safety;Necessary to address cognition/behavior during functional activity PT goals addressed during session: Mobility/safety with mobility;Balance OT goals addressed during session: ADL's and self-care       AM-PAC PT "6 Clicks" Mobility  Outcome Measure Help needed turning from your back to your side while in a flat bed  without using bedrails?: Total Help needed moving from lying on your back to sitting on the side of a flat bed without using bedrails?: Total Help needed moving to and from a bed to a chair (including a wheelchair)?: Total Help needed standing up from a chair using your arms (e.g., wheelchair or bedside chair)?: Total Help needed to walk in hospital room?: Total Help needed climbing 3-5 steps with a railing? : Total 6 Click Score: 6    End of Session   Activity Tolerance: Treatment limited secondary to agitation Patient left: in bed;with call bell/phone within reach;with bed alarm set;with nursing/sitter in room;with restraints reapplied Nurse Communication: Mobility status PT Visit Diagnosis: Muscle weakness (generalized) (M62.81);Other abnormalities of gait and mobility (R26.89)    Time: 2353-6144 PT Time Calculation (min) (ACUTE ONLY): 16 min   Charges:   PT Evaluation $PT Eval Low Complexity: 1 Low        Kahleb Mcclane E, PT Acute Rehabilitation Services Pager [phone]  Office [phone]   Kamoni Depree D Elonda Husky 07/21/2020, 12:19 PM

## 2020-07-21 NOTE — Progress Notes (Signed)
Patient arrived to room 2w37 from ED.  Assessment complete, VS obtained, and Admission database began.

## 2020-07-21 NOTE — ED Notes (Signed)
Pt screaming out and moving about in bed, pt remains in restraints at this time, pt stable. Pt unable to understand commands from staff or this RN.

## 2020-07-21 NOTE — Evaluation (Signed)
Occupational Therapy Evaluation Patient Details Name: Susan [name] MRN: [id number] DOB: 09/22/1960 Today's Date: 07/21/2020    History of Present Illness 59 year old female with past medical history of schizoaffective disorder/bipolar type, seizure disorder (?),  Diabetes mellitus type 2, HIV, history of polysubstance abuse,  right PCA/MCA stroke 03/2020, notable prolonged hospitalization from 3/10 to 01/09/2020 for sepsis with respiratory failure and encephalopathy status post trach/PEG and removal. Pt admitted to ED On 11/17 for AMS, agitation.   Clinical Impression   At this time patient waxes and wanes between agitated/agressive and sleeping. Pt today is not following any commands, did tell us her name is "Susan [name]" but otherwise does not respond to any auditory stimulus appropriately. Pt did not use grooming items when placed in hands (warm wash cloth) and just let it lie there. Attempted positional change to see if it would impact cognition and Pt increased in agitation actively resisting and with strong posterior push to lie back down again. Pt is total A at this time for ADL due to cognition/agitation and unsafe to attempt transfer at this time. OT will continue to follow acutely to monitor until she is appropriate and then OT will provide skilled intervention to maximize safety and independence in ADL and functional transfers.     Follow Up Recommendations  Other (comment) (inpatient psych)    Equipment Recommendations  Other (comment) (TBD)    Recommendations for Other Services Other (comment) (psychiatric assessment)     Precautions / Restrictions Precautions Precautions: Other (comment) (AMS, can be aggresive) Restrictions Weight Bearing Restrictions: No      Mobility Bed Mobility Overal bed mobility: Needs Assistance Bed Mobility: Supine to Sit;Sit to Supine     Supine to sit: Total assist;+2 for physical assistance;+2 for safety/equipment Sit to supine: Total assist;+2  for physical assistance;+2 for safety/equipment   General bed mobility comments: total A, not following commands to help - actively resisting any attempt to help her to EOB    Transfers                 General transfer comment: not safe to attempt this session    Balance Overall balance assessment: Needs assistance Sitting-balance support: Bilateral upper extremity supported;Feet supported Sitting balance-Leahy Scale: Zero Sitting balance - Comments: actively pushing backwards in sitting                                   ADL either performed or assessed with clinical judgement   ADL Overall ADL's : Needs assistance/impaired                                       General ADL Comments: total A due to cognition/agitation right now     Vision Patient Visual Report: Other (comment) (does not make eye contact, seems to have right gaze preferen)       Perception     Praxis      Pertinent Vitals/Pain Pain Assessment: Faces Pain Score: 0-No pain Pain Intervention(s): Monitored during session     Hand Dominance     Extremity/Trunk Assessment Upper Extremity Assessment Upper Extremity Assessment: Difficult to assess due to impaired cognition   Lower Extremity Assessment Lower Extremity Assessment: Difficult to assess due to impaired cognition       Communication Communication Communication: Other (comment) (not answering any questions right now -  can vocalize)   Cognition Arousal/Alertness: Awake/alert;Lethargic (waxed and wained throughout session) Behavior During Therapy: Restless;Agitated Overall Cognitive Status: No family/caregiver present to determine baseline cognitive functioning Area of Impairment: Orientation;Attention;Following commands;Awareness;Problem solving                 Orientation Level: Disoriented to;Place;Time;Situation (did state her name was Susan [name]) Current Attention Level: Focused   Following  Commands:  (does not follow commands at this time)   Awareness: Intellectual Problem Solving:  (no problem solving ability currently) General Comments: Pt not responding at all to any commands, attempt to calm Pt - attempted music, reassurance   General Comments  from previous admission, Pt liked "The temptations" music    Exercises     Shoulder Instructions      Home Living Family/patient expects to be discharged to:: Private residence Living Arrangements: Children Available Help at Discharge: Family;Available 24 hours/day Type of Home: House Home Access: Stairs to enter [employer] of Steps: several Entrance Stairs-Rails: Right;Left Home Layout: One level     Bathroom Shower/Tub: Teacher, early years/pre: Handicapped height     Home Equipment: Environmental consultant - 4 wheels;Cane - single point;Tub bench   Additional Comments: not able to share any information, not following commands. info gleaned from previous admission      Prior Functioning/Environment Level of Independence: Needs assistance        Comments: unsure - and Pt unable to report        OT Problem List: Impaired balance (sitting and/or standing);Decreased cognition;Decreased safety awareness      OT Treatment/Interventions: Self-care/ADL training;Therapeutic activities;Patient/family education;Balance training    OT Goals(Current goals can be found in the care plan section) Acute Rehab OT Goals Patient Stated Goal: none stated OT Goal Formulation: Patient unable to participate in goal setting ADL Goals Pt Will Perform Grooming: with set-up;sitting Pt Will Perform Upper Body Dressing: with set-up;sitting Pt Will Perform Lower Body Dressing: with min guard assist;sit to/from stand Pt Will Transfer to Toilet: with min guard assist;ambulating Pt Will Perform Toileting - Clothing Manipulation and hygiene: with mod assist;sit to/from stand Additional ADL Goal #1: Pt will follow one step  commands 3/4 times for ADL completion  OT Frequency: Min 1X/week (to monitor cognitive status for participation in therapy)   Barriers to D/C:            Co-evaluation PT/OT/SLP Co-Evaluation/Treatment: Yes Reason for Co-Treatment: Necessary to address cognition/behavior during functional activity;For patient/therapist safety;To address functional/ADL transfers PT goals addressed during session: Balance;Mobility/safety with mobility OT goals addressed during session: ADL's and self-care      AM-PAC OT "6 Clicks" Daily Activity     Outcome Measure Help from another person eating meals?: Total Help from another person taking care of personal grooming?: Total Help from another person toileting, which includes using toliet, bedpan, or urinal?: Total Help from another person bathing (including washing, rinsing, drying)?: Total Help from another person to put on and taking off regular upper body clothing?: Total Help from another person to put on and taking off regular lower body clothing?: Total 6 Click Score: 6   End of Session Nurse Communication: Mobility status  Activity Tolerance: Treatment limited secondary to agitation Patient left: in bed;with call bell/phone within reach;with restraints reapplied;with nursing/sitter in room  OT Visit Diagnosis: Other abnormalities of gait and mobility (R26.89);Other symptoms and signs involving cognitive function                Time: 1020-1043 OT Time Calculation (  min): 23 min Charges:  OT General Charges $OT Visit: 1 Visit OT Evaluation $OT Eval Moderate Complexity: [place] OTR/L Acute Rehabilitation Services Pager: [phone] Office: [place] 07/21/2020, 11:48 AM

## 2020-07-21 NOTE — Progress Notes (Signed)
CRITICAL VALUE ALERT  Critical Value:  MRSA NARES +  Date & Time Notied:  11/18 05:16  Provider Notified: Shalhoub  Orders Received/Actions taken: protocol followed.

## 2020-07-21 NOTE — ED Notes (Addendum)
Pt made loud outburst and placed self on floor, placing bed under bed. Pt uncooperative with staff while attempting to transfer pt from bed to floor. No physical injuries noted, pt remains GCS 9, Shalhoub, MD aware, order for safety sitter placed at this time. 1-1 observations in place at this time.

## 2020-07-21 NOTE — ED Notes (Signed)
Pt continuing to hit at staff and hit and scratch self as pt attempts to get out of bed, pt placed in soft restraints for pt and staff safety. Shalhoub, MD made aware, medication ineffective at this time. Pt continuous to scream out and ignore this RN and staff commands and calling out for "Susan [name]".

## 2020-07-22 DIAGNOSIS — D72829 Elevated white blood cell count, unspecified: Secondary | ICD-10-CM | POA: Diagnosis not present

## 2020-07-22 DIAGNOSIS — R1313 Dysphagia, pharyngeal phase: Secondary | ICD-10-CM | POA: Diagnosis present

## 2020-07-22 DIAGNOSIS — I674 Hypertensive encephalopathy: Secondary | ICD-10-CM | POA: Diagnosis not present

## 2020-07-22 DIAGNOSIS — U071 COVID-19: Secondary | ICD-10-CM | POA: Diagnosis not present

## 2020-07-22 DIAGNOSIS — G9341 Metabolic encephalopathy: Secondary | ICD-10-CM | POA: Diagnosis present

## 2020-07-22 DIAGNOSIS — F32A Depression, unspecified: Secondary | ICD-10-CM | POA: Diagnosis present

## 2020-07-22 DIAGNOSIS — E872 Acidosis: Secondary | ICD-10-CM | POA: Diagnosis not present

## 2020-07-22 DIAGNOSIS — Z20822 Contact with and (suspected) exposure to covid-19: Secondary | ICD-10-CM | POA: Diagnosis present

## 2020-07-22 DIAGNOSIS — E1110 Type 2 diabetes mellitus with ketoacidosis without coma: Secondary | ICD-10-CM | POA: Diagnosis not present

## 2020-07-22 DIAGNOSIS — R296 Repeated falls: Secondary | ICD-10-CM | POA: Diagnosis not present

## 2020-07-22 DIAGNOSIS — F209 Schizophrenia, unspecified: Secondary | ICD-10-CM | POA: Diagnosis not present

## 2020-07-22 DIAGNOSIS — E11649 Type 2 diabetes mellitus with hypoglycemia without coma: Secondary | ICD-10-CM | POA: Diagnosis not present

## 2020-07-22 DIAGNOSIS — B20 Human immunodeficiency virus [HIV] disease: Secondary | ICD-10-CM | POA: Diagnosis present

## 2020-07-22 DIAGNOSIS — Z4682 Encounter for fitting and adjustment of non-vascular catheter: Secondary | ICD-10-CM | POA: Diagnosis not present

## 2020-07-22 DIAGNOSIS — E1165 Type 2 diabetes mellitus with hyperglycemia: Secondary | ICD-10-CM | POA: Diagnosis not present

## 2020-07-22 DIAGNOSIS — Z7401 Bed confinement status: Secondary | ICD-10-CM | POA: Diagnosis not present

## 2020-07-22 DIAGNOSIS — R569 Unspecified convulsions: Secondary | ICD-10-CM | POA: Diagnosis not present

## 2020-07-22 DIAGNOSIS — E785 Hyperlipidemia, unspecified: Secondary | ICD-10-CM | POA: Diagnosis not present

## 2020-07-22 DIAGNOSIS — Z743 Need for continuous supervision: Secondary | ICD-10-CM | POA: Diagnosis not present

## 2020-07-22 DIAGNOSIS — R4189 Other symptoms and signs involving cognitive functions and awareness: Secondary | ICD-10-CM | POA: Diagnosis present

## 2020-07-22 DIAGNOSIS — J45901 Unspecified asthma with (acute) exacerbation: Secondary | ICD-10-CM | POA: Diagnosis not present

## 2020-07-22 DIAGNOSIS — R4701 Aphasia: Secondary | ICD-10-CM | POA: Diagnosis present

## 2020-07-22 DIAGNOSIS — E875 Hyperkalemia: Secondary | ICD-10-CM | POA: Diagnosis present

## 2020-07-22 DIAGNOSIS — F308 Other manic episodes: Secondary | ICD-10-CM | POA: Diagnosis not present

## 2020-07-22 DIAGNOSIS — K219 Gastro-esophageal reflux disease without esophagitis: Secondary | ICD-10-CM | POA: Diagnosis not present

## 2020-07-22 DIAGNOSIS — Z515 Encounter for palliative care: Secondary | ICD-10-CM | POA: Diagnosis not present

## 2020-07-22 DIAGNOSIS — R4182 Altered mental status, unspecified: Secondary | ICD-10-CM | POA: Diagnosis not present

## 2020-07-22 DIAGNOSIS — Z4659 Encounter for fitting and adjustment of other gastrointestinal appliance and device: Secondary | ICD-10-CM | POA: Diagnosis not present

## 2020-07-22 DIAGNOSIS — F17210 Nicotine dependence, cigarettes, uncomplicated: Secondary | ICD-10-CM | POA: Diagnosis present

## 2020-07-22 DIAGNOSIS — F250 Schizoaffective disorder, bipolar type: Secondary | ICD-10-CM | POA: Diagnosis present

## 2020-07-22 DIAGNOSIS — R410 Disorientation, unspecified: Secondary | ICD-10-CM | POA: Diagnosis not present

## 2020-07-22 DIAGNOSIS — E7800 Pure hypercholesterolemia, unspecified: Secondary | ICD-10-CM | POA: Diagnosis present

## 2020-07-22 DIAGNOSIS — I10 Essential (primary) hypertension: Secondary | ICD-10-CM | POA: Diagnosis not present

## 2020-07-22 DIAGNOSIS — I6783 Posterior reversible encephalopathy syndrome: Secondary | ICD-10-CM | POA: Diagnosis not present

## 2020-07-22 DIAGNOSIS — H748X3 Other specified disorders of middle ear and mastoid, bilateral: Secondary | ICD-10-CM | POA: Diagnosis not present

## 2020-07-22 DIAGNOSIS — G40901 Epilepsy, unspecified, not intractable, with status epilepticus: Secondary | ICD-10-CM | POA: Diagnosis present

## 2020-07-22 DIAGNOSIS — R404 Transient alteration of awareness: Secondary | ICD-10-CM | POA: Diagnosis not present

## 2020-07-22 DIAGNOSIS — Z8673 Personal history of transient ischemic attack (TIA), and cerebral infarction without residual deficits: Secondary | ICD-10-CM | POA: Diagnosis not present

## 2020-07-22 DIAGNOSIS — M1990 Unspecified osteoarthritis, unspecified site: Secondary | ICD-10-CM | POA: Diagnosis not present

## 2020-07-22 DIAGNOSIS — Z888 Allergy status to other drugs, medicaments and biological substances status: Secondary | ICD-10-CM | POA: Diagnosis not present

## 2020-07-22 DIAGNOSIS — F419 Anxiety disorder, unspecified: Secondary | ICD-10-CM | POA: Diagnosis present

## 2020-07-22 DIAGNOSIS — M2548 Effusion, other site: Secondary | ICD-10-CM | POA: Diagnosis not present

## 2020-07-22 DIAGNOSIS — Z794 Long term (current) use of insulin: Secondary | ICD-10-CM | POA: Diagnosis not present

## 2020-07-22 DIAGNOSIS — Z7982 Long term (current) use of aspirin: Secondary | ICD-10-CM | POA: Diagnosis not present

## 2020-07-22 DIAGNOSIS — M2550 Pain in unspecified joint: Secondary | ICD-10-CM | POA: Diagnosis not present

## 2020-07-22 DIAGNOSIS — R9082 White matter disease, unspecified: Secondary | ICD-10-CM | POA: Diagnosis not present

## 2020-07-22 LAB — BASIC METABOLIC PANEL
Anion gap: 14 (ref 5–15)
BUN: 19 mg/dL (ref 6–20)
CO2: 19 mmol/L — ABNORMAL LOW (ref 22–32)
Calcium: 9.2 mg/dL (ref 8.9–10.3)
Chloride: 108 mmol/L (ref 98–111)
Creatinine, Ser: 0.88 mg/dL (ref 0.44–1.00)
GFR, Estimated: >60 mL/min (ref >60)
Glucose, Bld: 143 mg/dL — ABNORMAL HIGH (ref 70–99)
Potassium: 3.8 mmol/L (ref 3.5–5.1)
Sodium: 141 mmol/L (ref 135–145)

## 2020-07-22 MED ORDER — THIAMINE HCL 100 MG/ML IJ SOLN
Freq: Once | INTRAVENOUS | Status: AC
  Filled 2020-07-22: qty 1000

## 2020-07-22 MED ORDER — STERILE WATER FOR INJECTION IJ SOLN
INTRAMUSCULAR | Status: AC
  Administered 2020-07-22: 10 mL
  Filled 2020-07-22: qty 10

## 2020-07-22 NOTE — Care Management Obs Status (Signed)
[place] NOTIFICATION   Patient Details  Name: LAVANDA NEVELS MRN: [id number] Date of Birth: [birth year]   Medicare Observation Status Notification Given:  Yes    Angelita Ingles, RN 07/22/2020, 12:02 PM

## 2020-07-22 NOTE — TOC Initial Note (Signed)
Transition of Care [facility]) - Initial/Assessment Note    Patient Details  Name: Susan [name] MRN: [id number] Date of Birth: Jan 13, 1961  Transition of Care [facility]) CM/SW Contact:    Angelita Ingles, RN Phone Number: [phone]  07/22/2020, 1:08 PM  Clinical Narrative: TOC following for patient admitted from home with daughter. CM spoke with daughter who states that she plans to take patient back home with her upon discharge. Daughter is able to provide care and transport patient to follow up appointments. Patient is currently restrained and not medically ready for discharge. TOC to follow.                   Expected Discharge Plan: Home/Self Care Barriers to Discharge: Continued Medical Work up   Patient Goals and CMS Choice Patient states their goals for this hospitalization and ongoing recovery are:: Daughter states that the goal is for her mother to return home with her   Choice offered to / list presented to : NA  Expected Discharge Plan and Services Expected Discharge Plan: Home/Self Care In-house Referral: NA Discharge Planning Services: CM Consult Post Acute Care Choice: NA Living arrangements for the past 2 months: Single Family Home                 DME Arranged: N/A DME Agency: NA       HH Arranged: NA [place] Agency: NA        Prior Living Arrangements/Services Living arrangements for the past 2 months: Single Family Home Lives with:: Adult Children Patient language and need for interpreter reviewed:: Yes Do you feel safe going back to the place where you live?:  (patient is unable to answer)      Need for Family Participation in Patient Care: Yes (Comment) Care giver support system in place?: Yes (comment)   Criminal Activity/Legal Involvement Pertinent to Current Situation/Hospitalization: No - Comment as needed  Activities of Daily Living Home Assistive Devices/Equipment: None ADL Screening (condition at time of admission) Patient's cognitive ability  adequate to safely complete daily activities?: No Is the patient deaf or have difficulty hearing?: No Does the patient have difficulty seeing, even when wearing glasses/contacts?: No Does the patient have difficulty concentrating, remembering, or making decisions?: Yes Patient able to express need for assistance with ADLs?: No Does the patient have difficulty dressing or bathing?: Yes Independently performs ADLs?: No Communication: Needs assistance Is this a change from baseline?: Change from baseline, expected to last <3 days Dressing (OT): Needs assistance Is this a change from baseline?: Change from baseline, expected to last <3days Grooming: Needs assistance Is this a change from baseline?: Change from baseline, expected to last <3 days Feeding: Needs assistance Is this a change from baseline?: Change from baseline, expected to last <3 days Bathing: Needs assistance Is this a change from baseline?: Change from baseline, expected to last <3 days Toileting: Needs assistance Is this a change from baseline?: Change from baseline, expected to last <3 days In/Out Bed: Needs assistance Is this a change from baseline?: Change from baseline, expected to last <3 days Walks in Home: Needs assistance Is this a change from baseline?: Change from baseline, expected to last <3 days Does the patient have difficulty walking or climbing stairs?: Yes Weakness of Legs: Both Weakness of Arms/Hands: None  Permission Sought/Granted Permission sought to share information with :  (patient is confused unable to give consent) Permission granted to share information with : No  Emotional Assessment Appearance:: Appears stated age Attitude/Demeanor/Rapport: Other (comment) (confused rambling speech) Affect (typically observed): Unable to Assess Orientation: :  (unable to assess confused and does not respond appropiately to questions.) Alcohol / Substance Use: Not Applicable Psych  Involvement: Outpatient Provider  Admission diagnosis:  Altered mental status [Z61.09] Metabolic encephalopathy [U04.54] Patient Active Problem List   Diagnosis Date Noted  . Metabolic encephalopathy 09/81/1914  . Altered mental status 07/20/2020  . Encephalopathy 04/02/2020  . Acute metabolic encephalopathy 78/29/5621  . Muscle spasm 04/01/2020  . Acute encephalopathy 03/08/2020  . Palliative care by specialist   . Adult failure to thrive   . Hx of completed stroke 11/24/2019  . Acute kidney injury (AKI) with acute tubular necrosis (ATN) (HCC)   . Aortic valve endocarditis 11/14/2019  . Recurrent falls 10/15/2019  . Constipation 02/26/2019  . Schizophrenia ([place]) 02/18/2019  . AKI (acute kidney injury) ([place]) 02/17/2019  . Chronic respiratory failure ([place]) 02/16/2019  . Right hip pain 02/21/2018  . DNR (do not resuscitate) discussion 08/23/2017  . Weakness generalized 06/24/2017  . Osteoarthritis 07/14/2016  . Abnormal hemoglobin ([place]) 04/18/2016  . Hot flashes 10/16/2015  . Human immunodeficiency virus (HIV) disease ([place]) 10/05/2015  . PTSD (post-traumatic stress disorder) 05/12/2015  . MDD (major depressive disorder), recurrent episode, moderate ([place]) 05/11/2015  . Cannabis use disorder, severe, dependence ([place]) 05/11/2015  . Tobacco use disorder 05/11/2015  . Uncontrolled type 2 diabetes mellitus with hyperglycemia, with long-term current use of insulin ([place]) 04/18/2015  . Diabetes mellitus type 2 without retinopathy ([place]) 01/28/2015  . Hereditary and idiopathic peripheral neuropathy 06/03/2013  . Dysfunctional uterine bleeding 06/04/2012  . Dyslipidemia   . GERD (gastroesophageal reflux disease)   . Schizoaffective disorder, bipolar type ([place]) 04/26/2011  . Morbid obesity ([place]) 04/26/2011  . HIV disease ([place]) 03/28/2011  . Essential hypertension 03/28/2011   PCP:  Hoyt Koch, MD Pharmacy:   [facility] [place], [address] [place] [address] Phone: [phone] Fax: [phone]     Social Determinants of Health (SDOH) Interventions    Readmission Risk Interventions Readmission Risk Prevention Plan 03/11/2020 02/19/2019  Transportation Screening Complete Complete  Medication Review (RN Care Manager) Referral to Pharmacy Complete  PCP or Specialist appointment within 3-5 days of discharge Complete Complete  HRI or Home Care Consult Complete Complete  SW Recovery Care/Counseling Consult Complete Complete  Palliative Care Screening Not Applicable Not [place] Complete Not Applicable  Some recent data might be hidden

## 2020-07-22 NOTE — Progress Notes (Signed)
PROGRESS NOTE    Susan [name]  WCB:[id number] DOB: 01/11/1961 DOA: 07/20/2020 PCP: Hoyt Koch, MD    Brief Narrative:  Mrs. Polendo was admitted to the hospital with a working diagnosis of acute metabolic encephalopathy.  59 year old female past medical history for schizoaffective disorder, bipolar, seizure disorder, type 2 diabetes mellitus, polysubstance abuse, HIV, history of CVA and a recent hospitalization for respiratory failure and encephalopathy March to May 2021, that required trach and PEG.  Patient was brought to the hospital due to severe agitation, approximately for about 24 hours, apparently patient has stopped taking her psychotropic medications for several weeks. On her initial physical examination blood pressure 157/93, heart rate 112, respiratory rate 32, temperature 98, oxygen saturation 94%, her lungs were clear to auscultation bilaterally, heart S1-S2, present rhythm, soft abdomen, no lower extremity edema.  Dry mucous membranes, extreme agitation, moving all 4 extremities spontaneously.  Not following commands or answering questions. Sodium 139, potassium 3.8, chloride 106, bicarb 24, glucose 188, BUN 12, creatinine 0.93, AST 12, ALT 10, white count 7.6, hemoglobin 16.1, hematocrit 48.6, platelets 240.  Urinalysis negative for infection.  SARS COVID-19 negative. Head CT with no acute changes.  Multiple chronic infarcts. Chest radiograph no infiltrates. CT chest, abdomen pelvis, negative for dissection. EKG 53 bpm, normal axis, normal intervals, sinus rhythm, positive LVH, no ST segment T wave changes.  Patient has been placed on physical restrains and on antipsychotic regimen.   No meningeal signs, no fever or leukocytosis, doubt patient has bacterial meningitis. She is very agitated and likely will be very difficult to obtain lumbar puncture.  Hold on brain MRI and EEG, patient not cooperative.  Unable to take po due to cognitive impairment.     Assessment & Plan:   Principal Problem:   Altered mental status Active Problems:   Essential hypertension   Schizoaffective disorder, bipolar type (HCC)   GERD (gastroesophageal reflux disease)   Uncontrolled type 2 diabetes mellitus with hyperglycemia, with long-term current use of insulin (HCC)   Human immunodeficiency virus (HIV) disease ([place])    1. Acute metabolic encephalopathy, with delirium and possible psychosis.  Agitation has improved, patient able to talk to nursing but still very confused and disorientated, not fully interactive.  This am patient is sleeping at the time of my examination.   Continue current regimen of haldol and needed and scheduled olanzapine at night. If persistent and worsening agitation will up titrate the dose.   Resume IV fluids with balanced electrolyte solutions, plus thiamine and multivitamins.  Continue with aspiration and fall precautions, continue not able to take po due to cognitive impairment.   2. HTN. Blood pressure this am 143/58, continue with as needed hydralazine for extreme blood pressure elevation. Likely agitation is triggering uncontrolled HTN.  On clonidine patch.  3. Uncontrolled T2DM. Fasting glucose this am 232, patient with no po intake. Continue close monitoring, continue to hold on insulin for now.  Check renal panel this am.   4. HIV. Unable to ger her antiretroviral medications due to cognitive impairment.   5. GERD. IV pantoprazole.   6. Schizophrenia. At home on benztropine, buspirone, doxepin, hydroxyzine, quetiapine and sertraline.   Medications have been ordered, but patient not able to tolerate po due to cognitive impairtment.    Patient continue to be at high risk for worsening encephalopathy and psychosis  Status is: Observation  The patient will require care spanning > 2 midnights and should be moved to inpatient because: IV treatments appropriate due  to intensity of illness or inability  to take PO  Dispo: The patient is from: Home              Anticipated d/c is to: Home              Anticipated d/c date is: > 3 days              Patient currently is not medically stable to d/c.   DVT prophylaxis: Enoxaparin   Code Status:   full  Family Communication:  No family at the bedside, I spoke with her daughter yesterday.      Consultants:   physiatry       Subjective: Patient continue very agitated and combative, at the time of my examination she is sleeping. She has been talking to nursing but continue with significant disorientation.   Objective: Vitals:   07/21/20 1532 07/21/20 1934 07/21/20 2125 07/22/20 0525  BP: (!) 196/86 (!) 196/81 (!) 150/57 (!) 143/58  Pulse: 90 90 94 72  Resp: 18 18 (!) 22 18  Temp: 98.1 F (36.7 C) 98.1 F (36.7 C) 99.2 F (37.3 C) 98.2 F (36.8 C)  TempSrc: Oral Oral Axillary Axillary  SpO2: 100%  100% 100%  Weight:  81 kg    Height:  5\' 6"  (1.676 m)      Intake/Output Summary (Last 24 hours) at 07/22/2020 1202 Last data filed at 07/22/2020 0815 Gross per 24 hour  Intake 100.53 ml  Output --  Net 100.53 ml   Filed Weights   07/21/20 1934  Weight: 81 kg    Examination:   General: deconditioned and ill looking appearing  Neurology: sleeping, moving all 4 extremities spontaneously, on bilateral soft restrains.  E ENT: mild pallor, no icterus, oral mucosa moist Cardiovascular: No JVD. S1-S2 present, rhythmic, no gallops, rubs, or murmurs. No lower extremity edema. Pulmonary: positive breath sounds bilaterally with, no wheezing, rhonchi or rales. Gastrointestinal. Abdomen soft and non tender Skin. No rashes Musculoskeletal: no joint deformities     Data Reviewed: I have personally reviewed following labs and imaging studies  CBC: Recent Labs  Lab 07/20/20 1529 07/20/20 1545  WBC 7.6  --   NEUTROABS 3.7  --   HGB 16.1* 16.3*  HCT 48.6* 48.0*  MCV 86.8  --   PLT 240  --    Basic Metabolic  Panel: Recent Labs  Lab 07/20/20 1529 07/20/20 1545  NA 139 141  K 3.8 3.8  CL 106 105  CO2 24  --   GLUCOSE 188* 183*  BUN 12 13  CREATININE 0.93 0.80  CALCIUM 8.9  --    GFR: Estimated Creatinine Clearance: 82.3 mL/min (by C-G formula based on SCr of 0.8 mg/dL). Liver Function Tests: Recent Labs  Lab 07/20/20 1529  AST 12*  ALT 10  ALKPHOS 111  BILITOT 0.4  PROT 6.1*  ALBUMIN 3.0*   Recent Labs  Lab 07/20/20 1529  LIPASE 18   Recent Labs  Lab 07/20/20 1537  AMMONIA 53*   Coagulation Profile: Recent Labs  Lab 07/20/20 1529  INR 0.9   Cardiac Enzymes: No results for input(s): CKTOTAL, CKMB, CKMBINDEX, TROPONINI in the last 168 hours. BNP (last 3 results) No results for input(s): PROBNP in the last 8760 hours. HbA1C: Recent Labs    07/21/20 0238  HGBA1C 7.7*   CBG: Recent Labs  Lab 07/20/20 1533 07/21/20 0043  GLUCAP 185* 232*   Lipid Profile: Recent Labs    07/21/20 0238  CHOL 226*  HDL 47  LDLCALC 161*  TRIG 90  CHOLHDL 4.8   Thyroid Function Tests: Recent Labs    07/21/20 0238  TSH 0.867   Anemia Panel: Recent Labs    07/21/20 0238  VITAMINB12 256  FOLATE 8.1      Radiology Studies: I have reviewed all of the imaging during this hospital visit personally     Scheduled Meds: .  stroke: mapping our early stages of recovery book   Does not apply Once  . benztropine  0.5 mg Oral QHS  . bictegravir-emtricitabine-tenofovir AF  1 tablet Oral Daily  . Chlorhexidine Gluconate Cloth  6 each Topical Q0600  . cloNIDine  0.2 mg Transdermal Weekly  . cloNIDine  0.2 mg Oral Once  . doxepin  50 mg Oral QHS  . enoxaparin (LOVENOX) injection  40 mg Subcutaneous Daily  . hydrOXYzine  50 mg Oral TID  . mupirocin ointment  1 application Nasal BID  . OLANZapine  5 mg Intramuscular QHS  . pantoprazole (PROTONIX) IV  40 mg Intravenous Q24H  . QUEtiapine  200 mg Oral BID  . sertraline  50 mg Oral QHS  . sodium chloride flush  3 mL  Intravenous Once  . triamterene-hydrochlorothiazide  1 tablet Oral Daily  . umeclidinium-vilanterol  1 puff Inhalation Daily   Continuous Infusions:   LOS: 0 days        Antwain Caliendo Gerome Apley, MD

## 2020-07-23 ENCOUNTER — Inpatient Hospital Stay (HOSPITAL_COMMUNITY): Payer: Medicare Other

## 2020-07-23 DIAGNOSIS — R569 Unspecified convulsions: Secondary | ICD-10-CM | POA: Diagnosis not present

## 2020-07-23 DIAGNOSIS — G9341 Metabolic encephalopathy: Secondary | ICD-10-CM

## 2020-07-23 DIAGNOSIS — B20 Human immunodeficiency virus [HIV] disease: Secondary | ICD-10-CM | POA: Diagnosis not present

## 2020-07-23 DIAGNOSIS — I10 Essential (primary) hypertension: Secondary | ICD-10-CM | POA: Diagnosis not present

## 2020-07-23 DIAGNOSIS — R4182 Altered mental status, unspecified: Secondary | ICD-10-CM | POA: Diagnosis not present

## 2020-07-23 DIAGNOSIS — K219 Gastro-esophageal reflux disease without esophagitis: Secondary | ICD-10-CM | POA: Diagnosis not present

## 2020-07-23 LAB — COMPREHENSIVE METABOLIC PANEL
ALT: UNDETERMINED U/L (ref 0–44)
AST: 22 U/L (ref 15–41)
Albumin: 3.7 g/dL (ref 3.5–5.0)
Alkaline Phosphatase: 118 U/L (ref 38–126)
Anion gap: 17 — ABNORMAL HIGH (ref 5–15)
BUN: 24 mg/dL — ABNORMAL HIGH (ref 6–20)
CO2: 19 mmol/L — ABNORMAL LOW (ref 22–32)
Calcium: 9.4 mg/dL (ref 8.9–10.3)
Chloride: 104 mmol/L (ref 98–111)
Creatinine, Ser: 0.92 mg/dL (ref 0.44–1.00)
GFR, Estimated: >60 mL/min (ref >60)
Glucose, Bld: 165 mg/dL — ABNORMAL HIGH (ref 70–99)
Potassium: 3.8 mmol/L (ref 3.5–5.1)
Sodium: 140 mmol/L (ref 135–145)
Total Bilirubin: UNDETERMINED mg/dL (ref 0.3–1.2)
Total Protein: 7.4 g/dL (ref 6.5–8.1)

## 2020-07-23 LAB — CBC WITH DIFFERENTIAL/PLATELET
Abs Immature Granulocytes: 0.07 10*3/uL (ref 0.00–0.07)
Basophils Absolute: 0.1 10*3/uL (ref 0.0–0.1)
Basophils Relative: 0 %
Eosinophils Absolute: 0 10*3/uL (ref 0.0–0.5)
Eosinophils Relative: 0 %
HCT: 52.2 % — ABNORMAL HIGH (ref 36.0–46.0)
Hemoglobin: 17.7 g/dL — ABNORMAL HIGH (ref 12.0–15.0)
Immature Granulocytes: 1 %
Lymphocytes Relative: 23 %
Lymphs Abs: 3.2 10*3/uL (ref 0.7–4.0)
MCH: 28.6 pg (ref 26.0–34.0)
MCHC: 33.9 g/dL (ref 30.0–36.0)
MCV: 84.5 fL (ref 80.0–100.0)
Monocytes Absolute: 1.2 10*3/uL — ABNORMAL HIGH (ref 0.1–1.0)
Monocytes Relative: 9 %
Neutro Abs: 9.6 10*3/uL — ABNORMAL HIGH (ref 1.7–7.7)
Neutrophils Relative %: 67 %
Platelets: 186 10*3/uL (ref 150–400)
RBC: 6.18 MIL/uL — ABNORMAL HIGH (ref 3.87–5.11)
RDW: 14.1 % (ref 11.5–15.5)
WBC: 14.1 10*3/uL — ABNORMAL HIGH (ref 4.0–10.5)
nRBC: 0 % (ref 0.0–0.2)

## 2020-07-23 LAB — BLOOD GAS, ARTERIAL
Acid-base deficit: 0.2 mmol/L (ref 0.0–2.0)
Bicarbonate: 23.4 mmol/L (ref 20.0–28.0)
Drawn by: 519031
FIO2: 21
O2 Saturation: 92.6 %
Patient temperature: 37
pCO2 arterial: 34.7 mmHg (ref 32.0–48.0)
pH, Arterial: 7.444 (ref 7.350–7.450)
pO2, Arterial: 62.3 mmHg — ABNORMAL LOW (ref 83.0–108.0)

## 2020-07-23 MED ORDER — VALPROATE SODIUM 500 MG/5ML IV SOLN
15.0000 mg/kg/d | Freq: Three times a day (TID) | INTRAVENOUS | Status: DC
  Administered 2020-07-23 – 2020-07-25 (×5): 405 mg via INTRAVENOUS
  Filled 2020-07-23 (×8): qty 4.05

## 2020-07-23 MED ORDER — LORAZEPAM 2 MG/ML IJ SOLN
1.0000 mg | INTRAMUSCULAR | Status: DC | PRN
  Administered 2020-08-06 – 2020-08-10 (×6): 1 mg via INTRAVENOUS
  Filled 2020-07-23 (×6): qty 1

## 2020-07-23 MED ORDER — SODIUM CHLORIDE 0.9% FLUSH: 10.0000 mL | INTRAVENOUS | Status: DC | PRN

## 2020-07-23 MED ORDER — SODIUM CHLORIDE 0.9% FLUSH
10.0000 mL | Freq: Two times a day (BID) | INTRAVENOUS | Status: DC
  Administered 2020-07-23 – 2020-08-01 (×16): 10 mL

## 2020-07-23 MED ORDER — THIAMINE HCL 100 MG/ML IJ SOLN
Freq: Once | INTRAVENOUS | Status: AC
  Filled 2020-07-23: qty 1000

## 2020-07-23 MED ORDER — LORAZEPAM 2 MG/ML IJ SOLN
2.0000 mg | Freq: Once | INTRAMUSCULAR | Status: AC
  Administered 2020-07-23: 2 mg via INTRAVENOUS

## 2020-07-23 MED ORDER — VALPROATE SODIUM 500 MG/5ML IV SOLN
1600.0000 mg | Freq: Once | INTRAVENOUS | Status: AC
  Administered 2020-07-23: 1600 mg via INTRAVENOUS
  Filled 2020-07-23: qty 16

## 2020-07-23 NOTE — Progress Notes (Signed)
Patient with GPEDs on EEG, an unexpected finding.   She is being loaded with 1600 mg IV valproic acid (20 mg/kg load). Scheduled valproic acid to start at 10 PM (5 mg/kg IV TID)  Discussed with Dr. Hortense Ramal.   Electronically signed: Dr. Kerney Elbe

## 2020-07-23 NOTE — Progress Notes (Signed)
Stat  EEG complete - results pending.  

## 2020-07-23 NOTE — Progress Notes (Signed)
RT NOTES: ABG obtained and sent to lab. Lab tech Amanda notified.  

## 2020-07-23 NOTE — Progress Notes (Signed)
LTM EEG hooked up and running - no initial skin breakdown - push button tested - neuro notified. [facility] monitored 

## 2020-07-23 NOTE — Progress Notes (Signed)
Patient now being loaded with antiepileptic agent Valproic acid.   She continues to be somnolent, opens eyes to pain stimuli, but not following commands.  Will get ABG stat.

## 2020-07-23 NOTE — Progress Notes (Signed)
0715 Pt had seizures lasting 2 mins. Legs shaking and head turned to right side with mouth open drooling during seizure, eyes nystagmus.Pt  incontinent of urine. PT then appeared post ictal with right pupil reactive and left pupil sluggish. Rapid response called and MD paged. Pads applied to side rails. 0730 pt had seizure lasting 68mins. Leg shaking head turned to side. Ativan give. Turned pt to left side. When pt was transition from seizure to post ictal, pinched the back of pt. Leg and pt. Responded to pain.

## 2020-07-23 NOTE — Progress Notes (Signed)
PROGRESS NOTE    Susan [name]  MBE:[id number] DOB: 04/14/1961 DOA: 07/20/2020 PCP: Hoyt Koch, MD    Brief Narrative:  Susan [name] was admitted to the hospital with a working diagnosis of acute metabolic encephalopathy.  60 year old female past medical history for schizoaffective disorder, bipolar, seizure disorder, type 2 diabetes mellitus, polysubstance abuse, HIV, history of CVA and a recent hospitalization for respiratory failure and encephalopathyMarch to May 2021, that requiredtrach and PEG.  Patient was brought to the hospital due to severe agitation, approximately for about 24 hours, apparently patient has stopped taking her psychotropic medications for several weeks. On her initial physical examination blood pressure 157/93, heart rate 112, respiratory rate 32, temperature 98, oxygen saturation 94%, her lungs were clear to auscultation bilaterally, heart S1-S2, present rhythm, soft abdomen, no lower extremity edema. Dry mucous membranes, extreme agitation, moving all 4 extremities spontaneously. Not following commands or answering questions. Sodium 139, potassium 3.8, chloride 106, bicarb 24, glucose 188, BUN 12, creatinine 0.93, AST 12, ALT 10, white count 7.6, hemoglobin 16.1, hematocrit 48.6, platelets 240. Urinalysis negative for infection. SARS COVID-19 negative. Head CT with no acute changes. Multiple chronic infarcts. Chest radiograph no infiltrates. CT chest, abdomen pelvis, negative for dissection. EKG 53 bpm, normal axis, normal intervals, sinus rhythm, positive LVH, no ST segment T wave changes.  Patient has been placed on physical restrains and on antipsychotic regimen.   No meningeal signs, no fever or leukocytosis, doubt patient has bacterial meningitis. She is very agitated and likely will be very difficult to obtain lumbar puncture.  Hold on brain MRI and EEG, patient not cooperative.  Unable to take po due to cognitive impairment.   11/20  patient had a seizure with general clonic movements and loss of continence. Received lorazepam IV.    Assessment & Plan:   Principal Problem:   Altered mental status Active Problems:   Essential hypertension   Schizoaffective disorder, bipolar type (HCC)   GERD (gastroesophageal reflux disease)   Uncontrolled type 2 diabetes mellitus with hyperglycemia, with long-term current use of insulin (HCC)   Human immunodeficiency virus (HIV) disease (HCC)   Metabolic encephalopathy    1. Acute metabolic encephalopathy, with delirium and possible psychosis/ seizure. This am patient had episode of tonic clonic movements and loss of urine continence. Received lorazepam with improvement of symptoms. Now patient is with eyes closed, but moves all 4 extremities, shows resistance to passive movement.  TSH 0.867  Continue neuro checks q 4 and will add as needed IV lorazepam for seizures. Stat EEG. Continue IV fluids with multivitamins.  As needed haldol during the day and olanzapine at night. Continue with aspiration and fall precautions.   Positive leukocytosis, up to 14, will have a low threshold for lumbar puncture, currently with no meningeal signs.   2. HTN. blood pressure has been elevated up to 191 and 007 mmHg systolic, unclear if it triggered by agitation. She is not able to take oral medications. Continue as needed hydralazine.  Continue with clonidine patch.  3. Uncontrolled T2DM. Fasting glucose this am 165 mg/dl, continue with no po intake.  4. HIV. Unable to ger her antiretroviral medications due to cognitive impairment.   5. GERD. Continue with IV pantoprazole.   6. Schizophrenia. At home on benztropine, buspirone, doxepin, hydroxyzine, quetiapine and sertraline.   Her home medications have been ordered, but patient not able to tolerate po due to cognitive impairtment.    Patient continue to be at high risk for worsening encephalopathy  and recurrent seizures.    Status is: Inpatient  Remains inpatient appropriate because:IV treatments appropriate due to intensity of illness or inability to take PO   Dispo: The patient is from: Home              Anticipated d/c is to: Home              Anticipated d/c date is: > 3 days              Patient currently is not medically stable to d/c.    DVT prophylaxis: Enoxaparin   Code Status:   full  Family Communication:  No family at the bedside     Subjective: Patient had a tonic clonic episode with loss of continence, improved with lorazepam. Continue to be not interactive.   Objective: Vitals:   07/22/20 1254 07/22/20 2103 07/23/20 0516 07/23/20 0723  BP: (!) 163/67 (!) 148/67 (!) 209/92 (!) 191/100  Pulse: 63 83 88 86  Resp:  18 18   Temp:  99.2 F (37.3 C) 98.7 F (37.1 C)   TempSrc:  Oral Oral   SpO2:  99% 99% 100%  Weight:      Height:        Intake/Output Summary (Last 24 hours) at 07/23/2020 0805 Last data filed at 07/22/2020 0815 Gross per 24 hour  Intake 0 ml  Output --  Net 0 ml   Filed Weights   07/21/20 1934  Weight: 81 kg    Examination:   General: deconditioned  Neurology: moves all 4 extremities, opposes resistance to passive movements. Opens left eye to touch. Not following commands and no verbal response.  E ENT: no pallor, no icterus, oral mucosa moist Cardiovascular: No JVD. S1-S2 present, rhythmic, no gallops, rubs, or murmurs. No lower extremity edema. Pulmonary: positive breath sounds bilaterally, with no wheezing, rhonchi or rales. Gastrointestinal. Abdomen flat, no organomegaly, non tender, no rebound or guarding Skin. No rashes Musculoskeletal: no joint deformities     Data Reviewed: I have personally reviewed following labs and imaging studies  CBC: Recent Labs  Lab 07/20/20 1529 07/20/20 1545 07/23/20 0543  WBC 7.6  --  14.1*  NEUTROABS 3.7  --  9.6*  HGB 16.1* 16.3* 17.7*  HCT 48.6* 48.0* 52.2*  MCV 86.8  --  84.5  PLT 240  --  782    Basic Metabolic Panel: Recent Labs  Lab 07/20/20 1529 07/20/20 1545 07/22/20 1614 07/23/20 0420  NA 139 141 141 140  K 3.8 3.8 3.8 3.8  CL 106 105 108 104  CO2 24  --  19* 19*  GLUCOSE 188* 183* 143* 165*  BUN 12 13 19  24*  CREATININE 0.93 0.80 0.88 0.92  CALCIUM 8.9  --  9.2 9.4   GFR: Estimated Creatinine Clearance: 71.6 mL/min (by C-G formula based on SCr of 0.92 mg/dL). Liver Function Tests: Recent Labs  Lab 07/20/20 1529 07/23/20 0420  AST 12* 22  ALT 10 QUANTITY NOT SUFFICIENT, UNABLE TO PERFORM TEST  ALKPHOS 111 118  BILITOT 0.4 QUANTITY NOT SUFFICIENT, UNABLE TO PERFORM TEST  PROT 6.1* 7.4  ALBUMIN 3.0* 3.7   Recent Labs  Lab 07/20/20 1529  LIPASE 18   Recent Labs  Lab 07/20/20 1537  AMMONIA 53*   Coagulation Profile: Recent Labs  Lab 07/20/20 1529  INR 0.9   Cardiac Enzymes: No results for input(s): CKTOTAL, CKMB, CKMBINDEX, TROPONINI in the last 168 hours. BNP (last 3 results) No results for input(s): PROBNP in  the last 8760 hours. HbA1C: Recent Labs    07/21/20 0238  HGBA1C 7.7*   CBG: Recent Labs  Lab 07/20/20 1533 07/21/20 0043  GLUCAP 185* 232*   Lipid Profile: Recent Labs    07/21/20 0238  CHOL 226*  HDL 47  LDLCALC 161*  TRIG 90  CHOLHDL 4.8   Thyroid Function Tests: Recent Labs    07/21/20 0238  TSH 0.867   Anemia Panel: Recent Labs    07/21/20 0238  VITAMINB12 256  FOLATE 8.1      Radiology Studies: I have reviewed all of the imaging during this hospital visit personally     Scheduled Meds: .  stroke: mapping our early stages of recovery book   Does not apply Once  . benztropine  0.5 mg Oral QHS  . bictegravir-emtricitabine-tenofovir AF  1 tablet Oral Daily  . Chlorhexidine Gluconate Cloth  6 each Topical Q0600  . cloNIDine  0.2 mg Transdermal Weekly  . cloNIDine  0.2 mg Oral Once  . doxepin  50 mg Oral QHS  . enoxaparin (LOVENOX) injection  40 mg Subcutaneous Daily  . hydrOXYzine  50 mg Oral  TID  . mupirocin ointment  1 application Nasal BID  . OLANZapine  5 mg Intramuscular QHS  . pantoprazole (PROTONIX) IV  40 mg Intravenous Q24H  . QUEtiapine  200 mg Oral BID  . sertraline  50 mg Oral QHS  . sodium chloride flush  3 mL Intravenous Once  . triamterene-hydrochlorothiazide  1 tablet Oral Daily  . umeclidinium-vilanterol  1 puff Inhalation Daily   Continuous Infusions:   LOS: 1 day        Linzie Criss Gerome Apley, MD

## 2020-07-23 NOTE — Procedures (Addendum)
Patient Name: Susan [name]  MRN: [id number]  Epilepsy Attending: Lora Havens  Referring Physician/Provider: Dr Sander Radon Date: 07/23/2020 Duration: 29.37 mins  Patient history:  59 y.o. female PMHx of old strokes/TIAs, schizophrenia, IDDM II, anxiety and depression presented with ams. EEG to evaluate for seizure  Level of alertness: lethargic  AEDs during EEG study: None  Technical aspects: This EEG study was done with scalp electrodes positioned according to the 10-20 International system of electrode placement. Electrical activity was acquired at a sampling rate of 500Hz  and reviewed with a high frequency filter of 70Hz  and a low frequency filter of 1Hz . EEG data were recorded continuously and digitally stored.   Description: No posterior dominant rhythm was seen. EEG showed generalized periodic epileptiform discharges at 0.5hz , maximal right frontal region as well as  continuous generalized 3 to 6 Hz theta-delta slowing. Hyperventilation and photic stimulation were not performed.     ABNORMALITY -Periodic epileptiform discharges, generalized and maximal right frontal region -Continuous slow, generalized  IMPRESSION: This study showed evidence of generalized, maximal right frontal epileptogenicity likely due to underlying structural abnormality. There is also moderate diffuse encephalopathy, non specific etiology. No seizures were seen during this study.   Dr Earnestine Leys was notified.  [name]

## 2020-07-23 NOTE — Significant Event (Signed)
Rapid Response Event Note   Reason for Call :  seizure  Initial Focused Assessment:  I was called from the patient's RN stating that her patient had a witnessed seizure.  She described that the patient was having full convulsions.  When I got to the room the patient appeared to be post ictal.  Patient would not answer questions or follow commands for me.  About 20 minutes later she started trembling on her right side and then it moved to whole body convulsions with nystagmus.  When the patient was seizing, I administered painful stimulus to her right tricep, and she pulled away from the painful stimulus.       Interventions:  Bed rails padded, suction set up, 2 mg lorazepam given, and doctor briefed on situation.  Plan of Care:  Patient will remain on the unit, PRN benzo, and EEG ordered   Event Summary:   MD Notified: Dr. Cathlean Sauer Call Time: 0730 Arrival Time: 0740 End Time: 0830  Venetia Maxon, RN

## 2020-07-23 NOTE — Plan of Care (Signed)

## 2020-07-24 DIAGNOSIS — G9341 Metabolic encephalopathy: Secondary | ICD-10-CM | POA: Diagnosis not present

## 2020-07-24 DIAGNOSIS — R569 Unspecified convulsions: Secondary | ICD-10-CM | POA: Diagnosis not present

## 2020-07-24 DIAGNOSIS — R4182 Altered mental status, unspecified: Secondary | ICD-10-CM | POA: Diagnosis not present

## 2020-07-24 DIAGNOSIS — K219 Gastro-esophageal reflux disease without esophagitis: Secondary | ICD-10-CM | POA: Diagnosis not present

## 2020-07-24 DIAGNOSIS — I10 Essential (primary) hypertension: Secondary | ICD-10-CM | POA: Diagnosis not present

## 2020-07-24 DIAGNOSIS — F250 Schizoaffective disorder, bipolar type: Secondary | ICD-10-CM | POA: Diagnosis not present

## 2020-07-24 LAB — BASIC METABOLIC PANEL
Anion gap: 11 (ref 5–15)
BUN: 23 mg/dL — ABNORMAL HIGH (ref 6–20)
CO2: 20 mmol/L — ABNORMAL LOW (ref 22–32)
Calcium: 8.7 mg/dL — ABNORMAL LOW (ref 8.9–10.3)
Chloride: 110 mmol/L (ref 98–111)
Creatinine, Ser: 0.75 mg/dL (ref 0.44–1.00)
GFR, Estimated: >60 mL/min (ref >60)
Glucose, Bld: 153 mg/dL — ABNORMAL HIGH (ref 70–99)
Potassium: 5.4 mmol/L — ABNORMAL HIGH (ref 3.5–5.1)
Sodium: 141 mmol/L (ref 135–145)

## 2020-07-24 LAB — CBC WITH DIFFERENTIAL/PLATELET
Abs Immature Granulocytes: 0.04 10*3/uL (ref 0.00–0.07)
Basophils Absolute: 0.1 10*3/uL (ref 0.0–0.1)
Basophils Relative: 1 %
Eosinophils Absolute: 0.1 10*3/uL (ref 0.0–0.5)
Eosinophils Relative: 1 %
HCT: 51.4 % — ABNORMAL HIGH (ref 36.0–46.0)
Hemoglobin: 17.4 g/dL — ABNORMAL HIGH (ref 12.0–15.0)
Immature Granulocytes: 0 %
Lymphocytes Relative: 36 %
Lymphs Abs: 3.4 10*3/uL (ref 0.7–4.0)
MCH: 28.9 pg (ref 26.0–34.0)
MCHC: 33.9 g/dL (ref 30.0–36.0)
MCV: 85.4 fL (ref 80.0–100.0)
Monocytes Absolute: 0.8 10*3/uL (ref 0.1–1.0)
Monocytes Relative: 8 %
Neutro Abs: 5.2 10*3/uL (ref 1.7–7.7)
Neutrophils Relative %: 54 %
Platelets: 173 10*3/uL (ref 150–400)
RBC: 6.02 MIL/uL — ABNORMAL HIGH (ref 3.87–5.11)
RDW: 13.9 % (ref 11.5–15.5)
WBC: 9.6 10*3/uL (ref 4.0–10.5)
nRBC: 0 % (ref 0.0–0.2)

## 2020-07-24 LAB — GLUCOSE, CAPILLARY: Glucose-Capillary: 123 mg/dL — ABNORMAL HIGH (ref 70–99)

## 2020-07-24 LAB — VALPROIC ACID LEVEL: Valproic Acid Lvl: 63 ug/mL (ref 50.0–100.0)

## 2020-07-24 MED ORDER — LEVETIRACETAM IN NACL 1000 MG/100ML IV SOLN
1000.0000 mg | INTRAVENOUS | Status: AC
  Administered 2020-07-25: 1000 mg via INTRAVENOUS
  Filled 2020-07-24: qty 100

## 2020-07-24 MED ORDER — SODIUM CHLORIDE 0.9 % IV SOLN
100.0000 mg | Freq: Two times a day (BID) | INTRAVENOUS | Status: DC
  Administered 2020-07-25: 100 mg via INTRAVENOUS
  Filled 2020-07-24 (×2): qty 10

## 2020-07-24 MED ORDER — CLONIDINE HCL 0.3 MG/24HR TD PTWK
0.3000 mg | MEDICATED_PATCH | TRANSDERMAL | Status: DC
  Administered 2020-07-28: 0.3 mg via TRANSDERMAL
  Filled 2020-07-24: qty 1

## 2020-07-24 MED ORDER — LEVETIRACETAM IN NACL 1000 MG/100ML IV SOLN
1000.0000 mg | Freq: Two times a day (BID) | INTRAVENOUS | Status: DC
  Administered 2020-07-25: 1000 mg via INTRAVENOUS
  Filled 2020-07-24: qty 100

## 2020-07-24 MED ORDER — HYDRALAZINE HCL 20 MG/ML IJ SOLN
10.0000 mg | Freq: Four times a day (QID) | INTRAMUSCULAR | Status: DC | PRN
  Administered 2020-07-24 – 2020-07-28 (×7): 10 mg via INTRAVENOUS
  Filled 2020-07-24 (×7): qty 1

## 2020-07-24 MED ORDER — SODIUM CHLORIDE 0.9 % IV SOLN
200.0000 mg | Freq: Once | INTRAVENOUS | Status: AC
  Administered 2020-07-24: 200 mg via INTRAVENOUS
  Filled 2020-07-24: qty 20

## 2020-07-24 MED ORDER — SODIUM CHLORIDE 0.9 % IV SOLN
2000.0000 mg | Freq: Once | INTRAVENOUS | Status: AC
  Administered 2020-07-24: 2000 mg via INTRAVENOUS
  Filled 2020-07-24: qty 20

## 2020-07-24 MED ORDER — THIAMINE HCL 100 MG/ML IJ SOLN
Freq: Once | INTRAVENOUS | Status: AC
  Filled 2020-07-24: qty 1000

## 2020-07-24 MED ORDER — LEVETIRACETAM IN NACL 500 MG/100ML IV SOLN
500.0000 mg | Freq: Two times a day (BID) | INTRAVENOUS | Status: DC
  Administered 2020-07-24: 500 mg via INTRAVENOUS
  Filled 2020-07-24 (×2): qty 100

## 2020-07-24 NOTE — Progress Notes (Signed)
LTM maint complete - no skin breakdown under: Fp2 F4 F8 A1

## 2020-07-24 NOTE — Procedures (Addendum)
Patient Name: Susan [name]  MRN: [id number]  Epilepsy Attending: Lora Havens  Referring Physician/Provider: Dr Kerney Elbe Duration: 07/23/2020 1401 to 07/24/2020 1401  Patient history: 59 y.o.femalePMHx of old strokes/TIAs, schizophrenia, IDDM II, anxiety and depression presented with ams. EEG to evaluate for seizure  Level of alertness: lethargic  AEDs during EEG study: VPA  Technical aspects: This EEG study was done with scalp electrodes positioned according to the 10-20 International system of electrode placement. Electrical activity was acquired at a sampling rate of 500Hz  and reviewed with a high frequency filter of 70Hz  and a low frequency filter of 1Hz . EEG data were recorded continuously and digitally stored.   Description: No posterior dominant rhythm was seen. EEG showed generalized periodic epileptiform discharges at 0.5hz , maximal right frontal region as well as continuous generalized 3 to 6 Hz theta-delta slowing.   Multiple seizures were recorded during the study, last seizure on 07/25/2020 at 0913.  During some of the seizures, patient was asleep and no clinical signs were noted.  At other times, patient was noted to have side to side head movement followed by non versive LEFT gaze followed by head deviation.  During one of the seizures at [place] on 07/24/2020 patient was also noted to have right upper and lower extremity jerking, stopped talking and then resumed talking after the seizure ended.  Average duration of seizure was about 1 minute.  Concomitant eeg during all the seizures showed right frontal rhythmic 3-4hz  theta-delta slowing admixed with periodic sharp waves in right frontal region at 1hz  which gradually increase in frequency to 8-9hz  alpha activity and involve left hemisphere.  ABNORMALITY - Seizure, right frontal region - Periodic epileptiform discharges, generalized and maximal right frontal region - Continuous slow, generalized  IMPRESSION: This  study showed multiple seizures with different semiology as described above arising from right frontal region, average duration about 1 minute, last seizure on 07/24/2020 at 0913 . There is also evidence of generalized, maximal right frontal epileptogenicity likely due to underlying structural abnormality. Additionally there is moderate to severe diffuse encephalopathy, non specific etiology.   Glyndon Tursi [name]

## 2020-07-24 NOTE — Progress Notes (Signed)
While in pt.'s room, spoke to pt., opened eyes and began seizure like activity, pt.'s right leg started shaking, then arms and body, pt.'s head then started turning side to side, then staying to the left side with mouth open and eyes nystagmus, incontinent of urine, activity lasted approx. 30 seconds.[name] after, pt. Started rambling speech that wasn't making any sense. EEG tech called and notified of seizure activity and notified MD. MD came to bedside to assess pt.

## 2020-07-24 NOTE — Progress Notes (Signed)
Subjective: Seizure noted by RN this AM which lasted for about 30 seconds, consisting of RLE jerking, right facial twitching and head turned to the LEFT, with speech arrest and a blank stare. The patient resumed speaking immediately afterwards. Of note, speech content is stereotyped and perseverative but she will make eye contact.   Objective: Current vital signs: BP (!) 216/105 (BP Location: Right Leg)   Pulse 76   Temp 98.4 F (36.9 C)   Resp 18   Ht 5\' 6"  (1.676 m)   Wt 81 kg   LMP  (LMP Unknown)   SpO2 97%   BMI 28.82 kg/m  Vital signs in last 24 hours: Temp:  [97.6 F (36.4 C)-98.7 F (37.1 C)] 98.4 F (36.9 C) (11/21 0729) Pulse Rate:  [76-112] 76 (11/21 0729) Resp:  [17-18] 18 (11/21 0605) BP: (123-229)/(77-107) 216/105 (11/21 0729) SpO2:  [97 %-100 %] 97 % (11/21 0729)  Intake/Output from previous day: 11/20 0701 - 11/21 0700 In: 104.1 [IV Piggyback:104.1] Out: -  Intake/Output this shift: No intake/output data recorded. Nutritional status:  Diet Order            Diet NPO time specified  Diet effective now                HEENT: [place]/AT Lungs: Respirations unlabored  Neurologic Exam: Ment: Awake and alert with intermittent eye contact. Not following commands. Not answering questions. Spontaneous speech content is stereotyped and perseverative but fluent. Speech output is unrelated to the situation or questions asked.  CN: PERRL. Will occasionally make eye contact with examiner and smile. Face symmetric. Vocalization intact.  Motor: Moves RUE and RLE purposefully and spontaneously but will not follow commands for motor testing.  There is less response to noxious stimuli to LUE and LLE than on the right.  Motor:    Lab Results: Results for orders placed or performed during the hospital encounter of 07/20/20 (from the past 48 hour(s))  Basic metabolic panel     Status: Abnormal   Collection Time: 07/22/20  4:14 PM  Result Value Ref Range   Sodium 141 135 -  145 mmol/L   Potassium 3.8 3.5 - 5.1 mmol/L   Chloride 108 98 - 111 mmol/L   CO2 19 (L) 22 - 32 mmol/L   Glucose, Bld 143 (H) 70 - 99 mg/dL    Comment: Glucose reference range applies only to samples taken after fasting for at least 8 hours.   BUN 19 6 - 20 mg/dL   Creatinine, Ser 0.88 0.44 - 1.00 mg/dL   Calcium 9.2 8.9 - 10.3 mg/dL   GFR, Estimated >60 >60 mL/min    Comment: (NOTE) Calculated using the CKD-EPI Creatinine Equation (2021)    Anion gap 14 5 - 15    Comment: Performed at [facility] [address], [place], [place] [address]  Comprehensive metabolic panel     Status: Abnormal   Collection Time: 07/23/20  4:20 AM  Result Value Ref Range   Sodium 140 135 - 145 mmol/L   Potassium 3.8 3.5 - 5.1 mmol/L    Comment: SLIGHT HEMOLYSIS   Chloride 104 98 - 111 mmol/L   CO2 19 (L) 22 - 32 mmol/L   Glucose, Bld 165 (H) 70 - 99 mg/dL    Comment: Glucose reference range applies only to samples taken after fasting for at least 8 hours.   BUN 24 (H) 6 - 20 mg/dL   Creatinine, Ser 0.92 0.44 - 1.00 mg/dL  Calcium 9.4 8.9 - 10.3 mg/dL   Total Protein 7.4 6.5 - 8.1 g/dL   Albumin 3.7 3.5 - 5.0 g/dL   AST 22 15 - 41 U/L   ALT QUANTITY NOT SUFFICIENT, UNABLE TO PERFORM TEST 0 - 44 U/L   Alkaline Phosphatase 118 38 - 126 U/L   Total Bilirubin QUANTITY NOT SUFFICIENT, UNABLE TO PERFORM TEST 0.3 - 1.2 mg/dL   GFR, Estimated >60 >60 mL/min    Comment: (NOTE) Calculated using the CKD-EPI Creatinine Equation (2021)    Anion gap 17 (H) 5 - 15    Comment: Performed at [facility] [address] [place], [place] [address]  CBC with Differential/Platelet     Status: Abnormal   Collection Time: 07/23/20  5:43 AM  Result Value Ref Range   WBC 14.1 (H) 4.0 - 10.5 K/uL   RBC 6.18 (H) 3.87 - 5.11 MIL/uL   Hemoglobin 17.7 (H) 12.0 - 15.0 g/dL   HCT 52.2 (H) 36 - 46 %   MCV 84.5 80.0 - 100.0 fL   MCH 28.6 26.0 - 34.0 pg   MCHC 33.9 30.0 - 36.0 g/dL   RDW 14.1 11.5 - 15.5 %    Platelets 186 150 - 400 K/uL   nRBC 0.0 0.0 - 0.2 %   Neutrophils Relative % 67 %   Neutro Abs 9.6 (H) 1.7 - 7.7 K/uL   Lymphocytes Relative 23 %   Lymphs Abs 3.2 0.7 - 4.0 K/uL   Monocytes Relative 9 %   Monocytes Absolute 1.2 (H) 0.1 - 1.0 K/uL   Eosinophils Relative 0 %   Eosinophils Absolute 0.0 0.0 - 0.5 K/uL   Basophils Relative 0 %   Basophils Absolute 0.1 0.0 - 0.1 K/uL   Immature Granulocytes 1 %   Abs Immature Granulocytes 0.07 0.00 - 0.07 K/uL    Comment: Performed at [facility], [place] [address] [place], [place] [address]  Blood gas, arterial     Status: Abnormal   Collection Time: 07/23/20  4:26 PM  Result Value Ref Range   FIO2 21.00    pH, Arterial 7.444 7.35 - 7.45   pCO2 arterial 34.7 32 - 48 mmHg   pO2, Arterial 62.3 (L) 83 - 108 mmHg   Bicarbonate 23.4 20.0 - 28.0 mmol/L   Acid-base deficit 0.2 0.0 - 2.0 mmol/L   O2 Saturation 92.6 %   Patient temperature 37.0    Collection site RIGHT RADIAL    Drawn by [phone]    Sample type ARTERIAL DRAW    Allens test (pass/fail) PASS PASS    Comment: Performed at [facility], [place] [address] [place], [place] [address]  Basic metabolic panel     Status: Abnormal   Collection Time: 07/24/20  6:07 AM  Result Value Ref Range   Sodium 141 135 - 145 mmol/L   Potassium 5.4 (H) 3.5 - 5.1 mmol/L    Comment: SPECIMEN HEMOLYZED. HEMOLYSIS MAY AFFECT INTEGRITY OF RESULTS.   Chloride 110 98 - 111 mmol/L   CO2 20 (L) 22 - 32 mmol/L   Glucose, Bld 153 (H) 70 - 99 mg/dL    Comment: Glucose reference range applies only to samples taken after fasting for at least 8 hours.   BUN 23 (H) 6 - 20 mg/dL   Creatinine, Ser 0.75 0.44 - 1.00 mg/dL   Calcium 8.7 (L) 8.9 - 10.3 mg/dL   GFR, Estimated >60 >60 mL/min    Comment: (NOTE) Calculated  using the CKD-EPI Creatinine Equation (2021)    Anion gap 11 5 - 15    Comment: Performed at [facility], [place] [address], [place], [place] [address]    Recent Results (from  the past 240 hour(s))  Respiratory Panel by RT PCR (Flu A&B, Covid) - Nasopharyngeal Swab     Status: None   Collection Time: 07/20/20  4:20 PM   Specimen: Nasopharyngeal Swab  Result Value Ref Range Status   SARS Coronavirus 2 by RT PCR NEGATIVE NEGATIVE Final    Comment: (NOTE) SARS-CoV-2 target nucleic acids are NOT DETECTED.  The SARS-CoV-2 RNA is generally detectable in upper respiratoy specimens during the acute phase of infection. The lowest concentration of SARS-CoV-2 viral copies this assay can detect is 131 copies/mL. A negative result does not preclude SARS-Cov-2 infection and should not be used as the sole basis for treatment or other patient management decisions. A negative result may occur with  improper specimen collection/handling, submission of specimen other than nasopharyngeal swab, presence of viral mutation(s) within the areas targeted by this assay, and inadequate number of viral copies (<131 copies/mL). A negative result must be combined with clinical observations, patient history, and epidemiological information. The expected result is Negative.  Fact Sheet for Patients:  PinkCheek.be  Fact Sheet for Healthcare Providers:  GravelBags.it  This test is no t yet approved or cleared by the [place] FDA and  has been authorized for detection and/or diagnosis of SARS-CoV-2 by FDA under an Emergency Use Authorization (EUA). This EUA will remain  in effect (meaning this test can be used) for the duration of the COVID-19 declaration under Section 564(b)(1) of the Act, 21 U.S.C. section 360bbb-3(b)(1), unless the authorization is terminated or revoked sooner.     Influenza A by PCR NEGATIVE NEGATIVE Final   Influenza B by PCR NEGATIVE NEGATIVE Final    Comment: (NOTE) The Xpert Xpress SARS-CoV-2/FLU/RSV assay is intended as an aid in  the diagnosis of influenza from Nasopharyngeal swab specimens and   should not be used as a sole basis for treatment. Nasal washings and  aspirates are unacceptable for Xpert Xpress SARS-CoV-2/FLU/RSV  testing.  Fact Sheet for Patients: PinkCheek.be  Fact Sheet for Healthcare Providers: GravelBags.it  This test is not yet approved or cleared by the [place] FDA and  has been authorized for detection and/or diagnosis of SARS-CoV-2 by  FDA under an Emergency Use Authorization (EUA). This EUA will remain  in effect (meaning this test can be used) for the duration of the  Covid-19 declaration under Section 564(b)(1) of the Act, 21  U.S.C. section 360bbb-3(b)(1), unless the authorization is  terminated or revoked. Performed at [facility], [place] [address], [place], [place] [address]   Urine culture     Status: Abnormal   Collection Time: 07/20/20 10:10 PM   Specimen: Urine, Random  Result Value Ref Range Status   Specimen Description URINE, RANDOM  Final   Special Requests   Final    NONE Performed at [facility], [place] [address], [place],  [address]    Culture MULTIPLE SPECIES PRESENT, SUGGEST RECOLLECTION (A)  Final   Report Status 07/21/2020 FINAL  Final  MRSA PCR Screening     Status: Abnormal   Collection Time: 07/21/20  3:02 AM   Specimen: Nasal Mucosa; Nasopharyngeal  Result Value Ref Range Status   MRSA by PCR POSITIVE (A) NEGATIVE Final    Comment:  The GeneXpert MRSA Assay (FDA approved for NASAL specimens only), is one component of a comprehensive MRSA colonization surveillance program. It is not intended to diagnose MRSA infection nor to guide or monitor treatment for MRSA infections. RESULT CALLED TO, READ BACK BY AND VERIFIED WITH: Tamala Ser RN 07/21/20 0515 JDW Performed at [facility], [address], [place], [place] [address]     Lipid Panel No results for input(s): CHOL, TRIG, HDL, CHOLHDL, VLDL, LDLCALC in the last 72  hours.  Studies/Results: EEG adult  Result Date: 07/23/2020 Lora Havens, MD     07/23/2020  1:42 PM Patient Name: Susan [name] Epilepsy Attending: Lora Havens Referring Physician/Provider: Dr Sander Radon Date: 07/23/2020 Duration: 29.37 mins Patient history:  59 y.o. female PMHx of old strokes/TIAs, schizophrenia, IDDM II, anxiety and depression presented with ams. EEG to evaluate for seizure Level of alertness: lethargic AEDs during EEG study: None Technical aspects: This EEG study was done with scalp electrodes positioned according to the 10-20 International system of electrode placement. Electrical activity was acquired at a sampling rate of 500Hz  and reviewed with a high frequency filter of 70Hz  and a low frequency filter of 1Hz . EEG data were recorded continuously and digitally stored. Description: No posterior dominant rhythm was seen. EEG showed generalized periodic epileptiform discharges at 0.5hz , maximal right frontal region as well as  continuous generalized 3 to 6 Hz theta-delta slowing. Hyperventilation and photic stimulation were not performed.   ABNORMALITY -Periodic epileptiform discharges, generalized and maximal right frontal region -Continuous slow, generalized IMPRESSION: This study showed evidence of generalized, maximal right frontal epileptogenicity likely due to underlying structural abnormality. There is also moderate diffuse encephalopathy, non specific etiology. No seizures were seen during this study. Dr Earnestine Leys was notified. Priyanka [name]    Medications:  Scheduled: .  stroke: mapping our early stages of recovery book   Does not apply Once  . benztropine  0.5 mg Oral QHS  . bictegravir-emtricitabine-tenofovir AF  1 tablet Oral Daily  . Chlorhexidine Gluconate Cloth  6 each Topical Q0600  . cloNIDine  0.2 mg Transdermal Weekly  . cloNIDine  0.2 mg Oral Once  . doxepin  50 mg Oral QHS  . enoxaparin (LOVENOX) injection  40 mg Subcutaneous  Daily  . hydrOXYzine  50 mg Oral TID  . mupirocin ointment  1 application Nasal BID  . pantoprazole (PROTONIX) IV  40 mg Intravenous Q24H  . QUEtiapine  200 mg Oral BID  . sertraline  50 mg Oral QHS  . sodium chloride flush  10-40 mL Intracatheter Q12H  . sodium chloride flush  3 mL Intravenous Once  . triamterene-hydrochlorothiazide  1 tablet Oral Daily  . umeclidinium-vilanterol  1 puff Inhalation Daily   Continuous: . valproate sodium 405 mg (07/24/20 4287)    Assessment:  59 y.o. female PMHx of old strokes/TIAs, schizophrenia, IDDM II, anxiety and depression. Who initially presented as a Code Stroke for acute onset of staring and speech arrest at home with possible right sided weakness and neglect. Her CT was negative for ICH and CTA negative for LVO, with nonfocal exam, but with altered speech that was felt to be most consistent with a psychogenic etiology. EEG was obtained after admission, which revealed unexpected finding of GPEDs with epileptogenicity being maximal in the right frontal region. She was loaded with 1600 mg IV valproic acid and started on scheduled dose of 5 mg/kg IV TID.  1. LTM EEG today reveals continued intermittent  seizures. A STAT VPA level is being ordered in anticipation of possible increase in the scheduled dose. For now, will add Keppra with 2000 mg IV load followed by 500 mg IV BID.   2. Exam reveals decreased movement on the left relative to the right; this finding is referable to the chronic right frontoparietal ischemic infarction seen on CT. Speech content is stereotyped and perseverative but she will make eye contact. Was not seizing at the time of Neurology exam today.  3. Seizure noted by RN this AM which lasted for about 30 seconds, consisting of RLE jerking, right facial twitching and head turned to the LEFT, with speech arrest and a blank stare. The patient resumed speaking immediately afterwards.  4. LTM EEG report for this AM:   Recommendations: -  Keppra IV load 2000 mg has been ordered - Scheduled Keppra 500 mg BID ordered - Continue valproic acid at 5 mg/kg TID - Valproic acid level is pending - Continue LTM   LOS: 2 days   @Electronically  signed: Dr. Kerney Elbe 07/24/2020  7:33 AM

## 2020-07-24 NOTE — Plan of Care (Signed)

## 2020-07-24 NOTE — Plan of Care (Signed)
Called by Dr. Hortense Ramal. Patient EEG preliminary review shows electrographic seizures. Currently on Keppra and Depakote. Keppra only at 500 mg twice daily. We will increase Keppra to 1000 mg twice daily after giving a load of 1000 mg now. We will add Vimpat.  Next  -- Amie Portland, MD [facility] Pager: [phone] If 7pm to 7am, please call on call as listed on AMION.

## 2020-07-24 NOTE — Progress Notes (Addendum)
PROGRESS NOTE    Susan [name]  KKX:[id number] DOB: 03-Jun-1961 DOA: 07/20/2020 PCP: Hoyt Koch, MD    Brief Narrative:  [name] was admitted to the hospital with a working diagnosis of acute metabolic encephalopathy with features of delirium and psychosis, complicated with new onset seizures.   59 year old female past medical history for schizoaffective disorder, bipolar, seizure disorder, type 2 diabetes mellitus, polysubstance abuse, HIV, history of CVA and a recent hospitalization for respiratory failure and encephalopathyMarch to May 2021, that requiredtrach and PEG.  Patient was brought to the hospital due to severe agitation, approximately for about 24 hours, apparently patient has stopped taking her psychotropic medications for several weeks. On her initial physical examination blood pressure 157/93, heart rate 112, respiratory rate 32, temperature 98, oxygen saturation 94%, her lungs were clear to auscultation bilaterally, heart S1-S2, present rhythm, soft abdomen, no lower extremity edema. Dry mucous membranes, extreme agitation, moving all 4 extremities spontaneously. Not following commands or answering questions. Sodium 139, potassium 3.8, chloride 106, bicarb 24, glucose 188, BUN 12, creatinine 0.93, AST 12, ALT 10, white count 7.6, hemoglobin 16.1, hematocrit 48.6, platelets 240. Urinalysis negative for infection. SARS COVID-19 negative. Head CT with no acute changes. Multiple chronic infarcts. Chest radiograph no infiltrates. CT chest, abdomen pelvis, negative for dissection. EKG 53 bpm, normal axis, normal intervals, sinus rhythm, positive LVH, no ST segment T wave changes.  Patient has been placed on physical restrains and on antipsychotic regimen.  No meningeal signs, no fever or leukocytosis, doubt patient has bacterial meningitis. She is very agitated and likely will be very difficult to obtain lumbar puncture.  Hold on brain MRI and EEG, patient  not cooperative.  Unable to take po due to cognitive impairment.  11/20 patient had a seizure with general clonic movements and loss of continence. Received lorazepam IV.   EEG positive for seizures and patient placed on IV valproic acid per neurology recommendations.    Assessment & Plan:   Principal Problem:   Altered mental status Active Problems:   Essential hypertension   Schizoaffective disorder, bipolar type (HCC)   GERD (gastroesophageal reflux disease)   Uncontrolled type 2 diabetes mellitus with hyperglycemia, with long-term current use of insulin (HCC)   Human immunodeficiency virus (HIV) disease (HCC)   Metabolic encephalopathy   1. Acute metabolic encephalopathy, with delirium and possible psychosis/ NEW onset seizure (persistent intermittent).  TSH 0.867  Patient now on continuous EEG monitoring, she has been loaded with valproic acid. This am had another tonic clonic episode with loss of urine continence.  Olanzapine was discontinued due to encephalopathy, her ABG yesterday with no respiratory acidosis. She had haloperidol this am at 1:00 am.  This am she woke up and have incoherent speech, at the time of my examination she only makes spontaneous movements but not following commands or answering to questions.  Continues on soft 2 wrist restrains to protect IV lines and EEG monitoring.   Continue neuro checks, aspiration and fall precautions, continue IV multivitamins including thiamine. Follow with Neurology recommendations with IV keppra, valproic acid and as needed lorazepam.    2. HTN.Persistent elevation in blood pressure 169 mmHg systolic. Increase clonidine patch to 0,3 mg and lowe the threshold for IV hydralazine to blood pressure more than 678 mmHg systolic.   Likely seizures and delirium are contributing to uncontrolled hypertension, patient not able to tolerate po antihypertensive medications.   3. Uncontrolled T2DM Hgb A1c 7,7.this am fasting  glucose is 153, continue sliding scale  for glucose cover and monitoring. Patient not able to tolerate po.  4. HIV. Unable to ger her antiretroviral medications due to cognitive impairment.    5. GERD.On IVpantoprazole.   6. Schizophrenia. At home on benztropine, buspirone, doxepin, hydroxyzine, quetiapine and sertraline.   Her home medications have been ordered, but patient not able to tolerate po due to cognitive impairtment. Patient has bee not taking her medications at home.   Patient continue to be at high risk for worsening seizures and encephalopathy   Status is: Inpatient  Remains inpatient appropriate because:IV treatments appropriate due to intensity of illness or inability to take PO   Dispo: The patient is from: Home              Anticipated d/c is to: Home              Anticipated d/c date is: > 3 days              Patient currently is not medically stable to d/c.   DVT prophylaxis: Enoxaparin   Code Status:   full  Family Communication: I spoke over the phone with the patient's daughter about patient's  condition, plan of care, prognosis and all questions were addressed.    Consultants:   Neurology   Subjective: Patient continue to be encephalopathic and not frank interactive, this am she was able to talk to nursing but not coherent. Noted to have new tonic clonic seizure this am with loss of continence.   Objective: Vitals:   07/23/20 2131 07/23/20 2306 07/24/20 0605 07/24/20 0729  BP: (!) 229/104 (!) 207/99 (!) 205/107 (!) 216/105  Pulse: 78  76 76  Resp: 18  18 (!) 21  Temp: 97.9 F (36.6 C)  97.6 F (36.4 C) 98.4 F (36.9 C)  TempSrc: Oral  Oral   SpO2: 100%  100% 97%  Weight:      Height:        Intake/Output Summary (Last 24 hours) at 07/24/2020 0936 Last data filed at 07/24/2020 5643 Gross per 24 hour  Intake 104.1 ml  Output --  Net 104.1 ml   Filed Weights   07/21/20 1934  Weight: 81 kg    Examination:   General: Not in  pain or dyspnea, deconditioned  Neurology: her eyes are closed, she moves all 4 extremities spontaneously, has soft restrains bilateral wrists, not following commands. Adequate respiratory effort.  E ENT: mild pallor, no icterus, oral mucosa moist Cardiovascular: No JVD. S1-S2 present, rhythmic, no gallops, rubs, or murmurs. No lower extremity edema. Pulmonary: positive breath sounds bilaterally with no wheezing, rhonchi or rales. Gastrointestinal. Abdomen soft and non tender Skin. No rashes Musculoskeletal: no joint deformities     Data Reviewed: I have personally reviewed following labs and imaging studies  CBC: Recent Labs  Lab 07/20/20 1529 07/20/20 1545 07/23/20 0543  WBC 7.6  --  14.1*  NEUTROABS 3.7  --  9.6*  HGB 16.1* 16.3* 17.7*  HCT 48.6* 48.0* 52.2*  MCV 86.8  --  84.5  PLT 240  --  329   Basic Metabolic Panel: Recent Labs  Lab 07/20/20 1529 07/20/20 1545 07/22/20 1614 07/23/20 0420 07/24/20 0607  NA 139 141 141 140 141  K 3.8 3.8 3.8 3.8 5.4*  CL 106 105 108 104 110  CO2 24  --  19* 19* 20*  GLUCOSE 188* 183* 143* 165* 153*  BUN 12 13 19  24* 23*  CREATININE 0.93 0.80 0.88 0.92 0.75  CALCIUM 8.9  --  9.2 9.4 8.7*   GFR: Estimated Creatinine Clearance: 82.3 mL/min (by C-G formula based on SCr of 0.75 mg/dL). Liver Function Tests: Recent Labs  Lab 07/20/20 1529 07/23/20 0420  AST 12* 22  ALT 10 QUANTITY NOT SUFFICIENT, UNABLE TO PERFORM TEST  ALKPHOS 111 118  BILITOT 0.4 QUANTITY NOT SUFFICIENT, UNABLE TO PERFORM TEST  PROT 6.1* 7.4  ALBUMIN 3.0* 3.7   Recent Labs  Lab 07/20/20 1529  LIPASE 18   Recent Labs  Lab 07/20/20 1537  AMMONIA 53*   Coagulation Profile: Recent Labs  Lab 07/20/20 1529  INR 0.9   Cardiac Enzymes: No results for input(s): CKTOTAL, CKMB, CKMBINDEX, TROPONINI in the last 168 hours. BNP (last 3 results) No results for input(s): PROBNP in the last 8760 hours. HbA1C: No results for input(s): HGBA1C in the last  72 hours. CBG: Recent Labs  Lab 07/20/20 1533 07/21/20 0043  GLUCAP 185* 232*   Lipid Profile: No results for input(s): CHOL, HDL, LDLCALC, TRIG, CHOLHDL, LDLDIRECT in the last 72 hours. Thyroid Function Tests: No results for input(s): TSH, T4TOTAL, FREET4, T3FREE, THYROIDAB in the last 72 hours. Anemia Panel: No results for input(s): VITAMINB12, FOLATE, FERRITIN, TIBC, IRON, RETICCTPCT in the last 72 hours.    Radiology Studies: I have reviewed all of the imaging during this hospital visit personally     Scheduled Meds: .  stroke: mapping our early stages of recovery book   Does not apply Once  . benztropine  0.5 mg Oral QHS  . bictegravir-emtricitabine-tenofovir AF  1 tablet Oral Daily  . Chlorhexidine Gluconate Cloth  6 each Topical Q0600  . cloNIDine  0.2 mg Transdermal Weekly  . cloNIDine  0.2 mg Oral Once  . doxepin  50 mg Oral QHS  . enoxaparin (LOVENOX) injection  40 mg Subcutaneous Daily  . hydrOXYzine  50 mg Oral TID  . mupirocin ointment  1 application Nasal BID  . pantoprazole (PROTONIX) IV  40 mg Intravenous Q24H  . QUEtiapine  200 mg Oral BID  . sertraline  50 mg Oral QHS  . sodium chloride flush  10-40 mL Intracatheter Q12H  . sodium chloride flush  3 mL Intravenous Once  . triamterene-hydrochlorothiazide  1 tablet Oral Daily  . umeclidinium-vilanterol  1 puff Inhalation Daily   Continuous Infusions: . levETIRAcetam    . valproate sodium 405 mg (07/24/20 6734)     LOS: 2 days        Karan Ramnauth Gerome Apley, MD

## 2020-07-25 DIAGNOSIS — B20 Human immunodeficiency virus [HIV] disease: Secondary | ICD-10-CM | POA: Diagnosis not present

## 2020-07-25 DIAGNOSIS — G9341 Metabolic encephalopathy: Secondary | ICD-10-CM | POA: Diagnosis not present

## 2020-07-25 DIAGNOSIS — K219 Gastro-esophageal reflux disease without esophagitis: Secondary | ICD-10-CM | POA: Diagnosis not present

## 2020-07-25 DIAGNOSIS — R569 Unspecified convulsions: Secondary | ICD-10-CM | POA: Diagnosis not present

## 2020-07-25 DIAGNOSIS — I10 Essential (primary) hypertension: Secondary | ICD-10-CM | POA: Diagnosis not present

## 2020-07-25 DIAGNOSIS — R4182 Altered mental status, unspecified: Secondary | ICD-10-CM | POA: Diagnosis not present

## 2020-07-25 LAB — BASIC METABOLIC PANEL
Anion gap: 16 — ABNORMAL HIGH (ref 5–15)
BUN: 24 mg/dL — ABNORMAL HIGH (ref 6–20)
CO2: 17 mmol/L — ABNORMAL LOW (ref 22–32)
Calcium: 8.8 mg/dL — ABNORMAL LOW (ref 8.9–10.3)
Chloride: 111 mmol/L (ref 98–111)
Creatinine, Ser: 0.79 mg/dL (ref 0.44–1.00)
GFR, Estimated: >60 mL/min (ref >60)
Glucose, Bld: 120 mg/dL — ABNORMAL HIGH (ref 70–99)
Potassium: 3.6 mmol/L (ref 3.5–5.1)
Sodium: 144 mmol/L (ref 135–145)

## 2020-07-25 LAB — CBC WITH DIFFERENTIAL/PLATELET
Abs Immature Granulocytes: 0.02 10*3/uL (ref 0.00–0.07)
Basophils Absolute: 0.1 10*3/uL (ref 0.0–0.1)
Basophils Relative: 1 %
Eosinophils Absolute: 0.2 10*3/uL (ref 0.0–0.5)
Eosinophils Relative: 2 %
HCT: 50.5 % — ABNORMAL HIGH (ref 36.0–46.0)
Hemoglobin: 16.7 g/dL — ABNORMAL HIGH (ref 12.0–15.0)
Immature Granulocytes: 0 %
Lymphocytes Relative: 40 %
Lymphs Abs: 3.6 10*3/uL (ref 0.7–4.0)
MCH: 28.5 pg (ref 26.0–34.0)
MCHC: 33.1 g/dL (ref 30.0–36.0)
MCV: 86.3 fL (ref 80.0–100.0)
Monocytes Absolute: 0.7 10*3/uL (ref 0.1–1.0)
Monocytes Relative: 8 %
Neutro Abs: 4.6 10*3/uL (ref 1.7–7.7)
Neutrophils Relative %: 49 %
Platelets: 151 10*3/uL (ref 150–400)
RBC: 5.85 MIL/uL — ABNORMAL HIGH (ref 3.87–5.11)
RDW: 13.8 % (ref 11.5–15.5)
WBC: 9.2 10*3/uL (ref 4.0–10.5)
nRBC: 0 % (ref 0.0–0.2)

## 2020-07-25 LAB — GLUCOSE, CAPILLARY
Glucose-Capillary: 121 mg/dL — ABNORMAL HIGH (ref 70–99)
Glucose-Capillary: 111 mg/dL — ABNORMAL HIGH (ref 70–99)
Glucose-Capillary: 120 mg/dL — ABNORMAL HIGH (ref 70–99)
Glucose-Capillary: 130 mg/dL — ABNORMAL HIGH (ref 70–99)

## 2020-07-25 MED ORDER — SODIUM CHLORIDE 0.9 % IV SOLN
150.0000 mg | Freq: Two times a day (BID) | INTRAVENOUS | Status: DC
  Administered 2020-07-25 – 2020-07-29 (×9): 150 mg via INTRAVENOUS
  Filled 2020-07-25 (×11): qty 15

## 2020-07-25 MED ORDER — VALPROATE SODIUM 500 MG/5ML IV SOLN
500.0000 mg | Freq: Three times a day (TID) | INTRAVENOUS | Status: DC
  Administered 2020-07-25 – 2020-07-30 (×15): 500 mg via INTRAVENOUS
  Filled 2020-07-25 (×19): qty 5

## 2020-07-25 MED ORDER — LEVETIRACETAM IN NACL 1500 MG/100ML IV SOLN
1500.0000 mg | Freq: Two times a day (BID) | INTRAVENOUS | Status: DC
  Administered 2020-07-25 – 2020-07-30 (×10): 1500 mg via INTRAVENOUS
  Filled 2020-07-25 (×12): qty 100

## 2020-07-25 MED ORDER — SODIUM CHLORIDE 0.9 % IV SOLN
100.0000 mg | Freq: Once | INTRAVENOUS | Status: AC
  Administered 2020-07-25: 100 mg via INTRAVENOUS
  Filled 2020-07-25: qty 10

## 2020-07-25 MED ORDER — THIAMINE HCL 100 MG/ML IJ SOLN
Freq: Once | INTRAVENOUS | Status: AC
  Filled 2020-07-25: qty 1000

## 2020-07-25 NOTE — Consult Note (Signed)
   [facility] CM Inpatient Consult   07/25/2020  Susan [name] 1960-10-29 [id number]   [facility] [[facility]] Patient: Susan [name]  Patient is currently active with [facility] for chronic disease management services.  Patient has been engaged by a [facility], also with [facility] Education officer, museum.   Our community based plan of care has focused on disease management and community resource support. Patient had obtained personal care services 5 days a week from 1-5 pm, and meals.   Patient will receive a post hospital call and will be evaluated for assessments and disease process education.  Patient was resting in bed.   Plan:  Will follow up with  Inpatient Transition Of Care [TOC] team member to make aware that [facility] following. Of note, [facility] Care Management services does not replace or interfere with any services that are needed or arranged by inpatient [facility] care management team.  For additional questions or referrals please contact:  Natividad Brood, RN BSN [facility] Liaison  [phone] business mobile phone Toll free office [phone]  Fax number: [phone] [email or web address]

## 2020-07-25 NOTE — Care Management Important Message (Signed)
Important Message  Patient Details  Name: Susan [name] MRN: [id number] Date of Birth: 17-Mar-1961   Medicare Important Message Given:  Yes     Annalena Piatt Montine Circle 07/25/2020, 2:53 PM

## 2020-07-25 NOTE — Progress Notes (Signed)
PROGRESS NOTE    Susan [name]  PJA:[id number] DOB: 10-26-1960 DOA: 07/20/2020 PCP: Hoyt Koch, MD    Brief Narrative:  Susan [name] was admitted to the hospital with a working diagnosis of acute metabolic encephalopathy with features of delirium and psychosis, complicated with new onset seizures.   59 year old female past medical history for schizoaffective disorder, bipolar, seizure disorder, type 2 diabetes mellitus, polysubstance abuse, HIV, history of CVA and a recent hospitalization for respiratory failure and encephalopathyMarch to May 2021, that requiredtrach and PEG.  Patient was brought to the hospital due to severe agitation, approximately for about 24 hours, apparently patient has stopped taking her psychotropic medications for several weeks. On her initial physical examination blood pressure 157/93, heart rate 112, respiratory rate 32, temperature 98, oxygen saturation 94%, her lungs were clear to auscultation bilaterally, heart S1-S2, present rhythm, soft abdomen, no lower extremity edema. Dry mucous membranes, extreme agitation, moving all 4 extremities spontaneously. Not following commands or answering questions. Sodium 139, potassium 3.8, chloride 106, bicarb 24, glucose 188, BUN 12, creatinine 0.93, AST 12, ALT 10, white count 7.6, hemoglobin 16.1, hematocrit 48.6, platelets 240. Urinalysis negative for infection. SARS COVID-19 negative. Head CT with no acute changes. Multiple chronic infarcts. Chest radiograph no infiltrates. CT chest, abdomen pelvis, negative for dissection. EKG 53 bpm, normal axis, normal intervals, sinus rhythm, positive LVH, no ST segment T wave changes.  Patient has been placed on physical restrains and on antipsychotic regimen.  No meningeal signs, no fever or leukocytosis, doubt patient has bacterial meningitis. She is very agitated and likely will be very difficult to obtain lumbar puncture.  Hold on brain MRI and EEG, patient  not cooperative.  Unable to take po due to cognitive impairment.  11/20 patient had a seizure with general clonic movements and loss of continence. Received lorazepam IV.  EEG positive for seizures and patient placed on IV valproic acid per neurology recommendations.  LTM EEG with continued intermittent seizures, epileptogenicity maximal in the right frontal region.   Assessment & Plan:   Principal Problem:   Altered mental status Active Problems:   Essential hypertension   Schizoaffective disorder, bipolar type (HCC)   GERD (gastroesophageal reflux disease)   Uncontrolled type 2 diabetes mellitus with hyperglycemia, with long-term current use of insulin (HCC)   Human immunodeficiency virus (HIV) disease (HCC)   Metabolic encephalopathy    1. Acute metabolic encephalopathy, with delirium and possible psychosis/ NEW onset seizure (persistent intermittent).  TSH 0.867 This am to my examination she is more reactive, opening eyes and partially following commands.   Continuous EEG monitoring Olanzapine has been discontinued and required only one dose of Haloperidol on 11.21.21 at 1 am.   On soft 2 wrist restrains to protect IV lines and EEG monitoring, neuro checks, aspiration and fall precautions. Continue NPO due to aspiration precautions. Supportive medical therapy with IV multivitamins including thiamine.  Antiepileptic regimen with: Lacosamide, Keppra, and Valproic acid, as needed lorazepam (#0 doses).    Continue with one to one observation and soft wrist restrains bilaterally to for safety.   2. HTN.her blood pressure has improved down to 169/80 mmHg, this am, continue with clonidine patch to 0,3 mg plus as needed hydralazine (#1 dose yesterday at noon)   Continue blood pressure monitoring, unable to get po antihypertensive medications due to risk of aspiration.   3. Uncontrolled T2DM Hgb A1c 7,7.glucose continue to be well controlled, fasting glucose this am is  120, capillary 123 and 121.  Patient  not able to eat due to altered mental status.  4. HIV. Unable to ger her antiretroviral medications due to cognitive impairment.    5. GERD.Continue with IVpantoprazole.   6. Schizophrenia. At home on benztropine, buspirone, doxepin, hydroxyzine, quetiapine and sertraline.   Her home medications have been ordered, but patient not able to tolerate po due to cognitive impairtment. Continue with inability to take po medications.   5. Hypokalemia with anion gap metabolic acidosis. Stable renal function with serum cr at 0.79 and bicarbonate at 17, K is 3.6, Cl 111.  Continue K correction with 40 Kcl IV.   Anion gap metabolic acidosis, likely due to seizures.   Patient continue to be at high risk for worsening encephalopathy and seizures.   Status is: Inpatient  Remains inpatient appropriate because:IV treatments appropriate due to intensity of illness or inability to take PO   Dispo: The patient is from: Home              Anticipated d/c is to: Home              Anticipated d/c date is: > 3 days              Patient currently is not medically stable to d/c.   DVT prophylaxis: Enoxaparin   Code Status:   full  Family Communication:  I spoke with her daughter yesterday.       Consultants:   Neurology     Subjective: Patient this am opens eyes to voice and partially follows commands. She has been verbal to nursing but continue to be incoherent.   Objective: Vitals:   07/24/20 1108 07/24/20 2129 07/25/20 0500 07/25/20 0814  BP: (!) 197/83 (!) 158/89 (!) 162/80 (!) 169/80  Pulse: 71 72 74 63  Resp: 20 20 20 18   Temp: 97.9 F (36.6 C) 97.6 F (36.4 C) 98 F (36.7 C) 98.2 F (36.8 C)  TempSrc:  Oral Oral Oral  SpO2: 96% 100% 100% 100%  Weight:      Height:        Intake/Output Summary (Last 24 hours) at 07/25/2020 1004 Last data filed at 07/25/2020 0500 Gross per 24 hour  Intake 262.1 ml  Output 500 ml  Net -237.9  ml   Filed Weights   07/21/20 1934  Weight: 81 kg    Examination:   General: deconditioned and ill looking appearing  Neurology: opens eyes to voice and touch, partially follows commands. Not verbal response to me but has been verbal to nursing, incoherent.  E ENT: mild pallor, no icterus, oral mucosa moist Cardiovascular: No JVD. S1-S2 present, rhythmic, no gallops, rubs, or murmurs. No lower extremity edema. Pulmonary: positive breath sounds bilaterally, with no wheezing, rhonchi or rales. Gastrointestinal. Abdomen soft and non tender Skin. No rashes Musculoskeletal: no joint deformities     Data Reviewed: I have personally reviewed following labs and imaging studies  CBC: Recent Labs  Lab 07/20/20 1529 07/20/20 1545 07/23/20 0543 07/24/20 1011 07/25/20 0440  WBC 7.6  --  14.1* 9.6 9.2  NEUTROABS 3.7  --  9.6* 5.2 4.6  HGB 16.1* 16.3* 17.7* 17.4* 16.7*  HCT 48.6* 48.0* 52.2* 51.4* 50.5*  MCV 86.8  --  84.5 85.4 86.3  PLT 240  --  186 173 101   Basic Metabolic Panel: Recent Labs  Lab 07/20/20 1529 07/20/20 1529 07/20/20 1545 07/22/20 1614 07/23/20 0420 07/24/20 0607 07/25/20 0440  NA 139   < > 141 141 140 141 144  K 3.8   < > 3.8 3.8 3.8 5.4* 3.6  CL 106   < > 105 108 104 110 111  CO2 24  --   --  19* 19* 20* 17*  GLUCOSE 188*   < > 183* 143* 165* 153* 120*  BUN 12   < > 13 19 24* 23* 24*  CREATININE 0.93   < > 0.80 0.88 0.92 0.75 0.79  CALCIUM 8.9  --   --  9.2 9.4 8.7* 8.8*   < > = values in this interval not displayed.   GFR: Estimated Creatinine Clearance: 82.3 mL/min (by C-G formula based on SCr of 0.79 mg/dL). Liver Function Tests: Recent Labs  Lab 07/20/20 1529 07/23/20 0420  AST 12* 22  ALT 10 QUANTITY NOT SUFFICIENT, UNABLE TO PERFORM TEST  ALKPHOS 111 118  BILITOT 0.4 QUANTITY NOT SUFFICIENT, UNABLE TO PERFORM TEST  PROT 6.1* 7.4  ALBUMIN 3.0* 3.7   Recent Labs  Lab 07/20/20 1529  LIPASE 18   Recent Labs  Lab 07/20/20 1537    AMMONIA 53*   Coagulation Profile: Recent Labs  Lab 07/20/20 1529  INR 0.9   Cardiac Enzymes: No results for input(s): CKTOTAL, CKMB, CKMBINDEX, TROPONINI in the last 168 hours. BNP (last 3 results) No results for input(s): PROBNP in the last 8760 hours. HbA1C: No results for input(s): HGBA1C in the last 72 hours. CBG: Recent Labs  Lab 07/20/20 1533 07/21/20 0043 07/24/20 2122 07/25/20 0740  GLUCAP 185* 232* 123* 121*   Lipid Profile: No results for input(s): CHOL, HDL, LDLCALC, TRIG, CHOLHDL, LDLDIRECT in the last 72 hours. Thyroid Function Tests: No results for input(s): TSH, T4TOTAL, FREET4, T3FREE, THYROIDAB in the last 72 hours. Anemia Panel: No results for input(s): VITAMINB12, FOLATE, FERRITIN, TIBC, IRON, RETICCTPCT in the last 72 hours.    Radiology Studies: I have reviewed all of the imaging during this hospital visit personally     Scheduled Meds: .  stroke: mapping our early stages of recovery book   Does not apply Once  . benztropine  0.5 mg Oral QHS  . bictegravir-emtricitabine-tenofovir AF  1 tablet Oral Daily  . Chlorhexidine Gluconate Cloth  6 each Topical Q0600  . [START ON 07/28/2020] cloNIDine  0.3 mg Transdermal Weekly  . doxepin  50 mg Oral QHS  . enoxaparin (LOVENOX) injection  40 mg Subcutaneous Daily  . hydrOXYzine  50 mg Oral TID  . mupirocin ointment  1 application Nasal BID  . pantoprazole (PROTONIX) IV  40 mg Intravenous Q24H  . QUEtiapine  200 mg Oral BID  . sertraline  50 mg Oral QHS  . sodium chloride flush  10-40 mL Intracatheter Q12H  . sodium chloride flush  3 mL Intravenous Once  . umeclidinium-vilanterol  1 puff Inhalation Daily   Continuous Infusions: . lacosamide (VIMPAT) IV    . levETIRAcetam 1,000 mg (07/25/20 0809)  . valproate sodium 405 mg (07/25/20 0538)     LOS: 3 days        Liberta Gimpel Gerome Apley, MD

## 2020-07-25 NOTE — Plan of Care (Signed)

## 2020-07-25 NOTE — Progress Notes (Addendum)
Subjective: Per RN, no clinical seizures observed this morning.  Patient continues to respond but inappropriately, not consistently following commands.  ROS: Unable to obtain due to poor mental status  Examination  Vital signs in last 24 hours: Temp:  [97.6 F (36.4 C)-98.2 F (36.8 C)] 98.2 F (36.8 C) (11/22 0814) Pulse Rate:  [63-74] 63 (11/22 0814) Resp:  [18-20] 18 (11/22 0814) BP: (158-169)/(80-89) 169/80 (11/22 0814) SpO2:  [100 %] 100 % (11/22 0814)  General: lying in bed, not in apparent distress CVS: pulse-normal rate and rhythm RS: breathing comfortably, CTA B Extremities: normal, warm Neuro: Awake, alert, looks at the examiner, pleasantly smiling, keeps repeating the word medicine, does not follow commands, does not mimic, PERRLA, blinks to threat bilaterally, no apparent facial asymmetry, spontaneously moving all 4 extremities in bed.  Basic Metabolic Panel: Recent Labs  Lab 07/20/20 1529 07/20/20 1529 07/20/20 1545 07/22/20 1614 07/22/20 1614 07/23/20 0420 07/24/20 0607 07/25/20 0440  NA 139   < > 141 141  --  140 141 144  K 3.8   < > 3.8 3.8  --  3.8 5.4* 3.6  CL 106   < > 105 108  --  104 110 111  CO2 24  --   --  19*  --  19* 20* 17*  GLUCOSE 188*   < > 183* 143*  --  165* 153* 120*  BUN 12   < > 13 19  --  24* 23* 24*  CREATININE 0.93   < > 0.80 0.88  --  0.92 0.75 0.79  CALCIUM 8.9   < >  --  9.2   < > 9.4 8.7* 8.8*   < > = values in this interval not displayed.    CBC: Recent Labs  Lab 07/20/20 1529 07/20/20 1545 07/23/20 0543 07/24/20 1011 07/25/20 0440  WBC 7.6  --  14.1* 9.6 9.2  NEUTROABS 3.7  --  9.6* 5.2 4.6  HGB 16.1* 16.3* 17.7* 17.4* 16.7*  HCT 48.6* 48.0* 52.2* 51.4* 50.5*  MCV 86.8  --  84.5 85.4 86.3  PLT 240  --  186 173 151     Coagulation Studies: No results for input(s): LABPROT, INR in the last 72 hours.  Imaging CT head without contrast 11/70/21: There is no acute intracranial hemorrhage mass effect no definite new  loss of gray-white differentiation. Multiple chronic infarcts are identified including involvement right parietal,temporal, and occipital lobes and left parietal and temporal lobes.Ex vacuo dilatation of adjacent lateral ventricles. No hydrocephalus. No extra-axial collection.  ASSESSMENT AND PLAN: 59 year old female with past medical history of CVA, schizophrenia, type 2 diabetes who presented with staring and speech arrest as well as possible right-sided weakness and neglect.  EEG showed generalized periodic epileptiform discharges, maximal right frontal region.   New onset epilepsy Aphasia Chronic strokes Acute encephalopathy, postictal/ictal Type 2 diabetes Schizophrenia -LTM EEG shows brief less frequent seizures now.  However did have another possible seizure this morning. -Seizures likely secondary to underlying strokes, unclear precipitating factor.  Low suspicion for infection as patient has been afebrile with no leukocytosis, no evidence of UTI, normal chest x-ray -Aphasia likely due to ictal/postictal state.  Recommendation -We will increase Keppra to 1500 mg twice daily, increase VPA to 500 mg every 8 hours, continue Vimpat 100 mg twice daily -Continue LTM EEG till patient is seizure-free for about 24 hours -Can increase Vimpat to 150 mg twice daily patient has any further seizures -We will check valproic acid level tomorrow  morning at 5 AM -Continue seizure precautions - PRN IV Ativan 2 mg for clinical seizure lasting more than 2 minutes -Management of rest of comorbidities per primary team  I have spent a total of  35 minutes with the patient reviewing hospital notes,  test results, labs and examining the patient as well as establishing an assessment and plan that was discussed personally with the patient's physician and RN.  > 50% of time was spent in direct patient care.    Zeb Comfort Epilepsy [facility] For questions after 5pm please refer to AMION to  reach the Neurologist on call

## 2020-07-25 NOTE — Procedures (Addendum)
Patient Name:Susan [name] OZH:[id number] Epilepsy Attending:Jacere Pangborn [name] Referring Physician/Provider:Dr Kerney Elbe Duration:07/24/2020 1401 to 07/25/2020 1401  Patient history:59 y.o.femalePMHx of old strokes/TIAs, schizophrenia, IDDM II, anxiety and depressionpresented with ams. EEG to evaluate for seizure  Level of alertness:awake, asleep  AEDs during EEG study:VPA, keppra  Technical aspects: This EEG study was done with scalp electrodes positioned according to the 10-20 International system of electrode placement. Electrical activity was acquired at a sampling rate of 500Hz  and reviewed with a high frequency filter of 70Hz  and a low frequency filter of 1Hz . EEG data were recorded continuously and digitally stored.   Description:Noposterior dominant rhythm was seen. Sleep was characterized by sleep spindles (12 to 14 Hz), maximal frontocentral region.  EEG showedgeneralized periodic epileptiform discharges at 0.5hz , maximal right frontal region as well ascontinuous generalized 3 to 6 Hz theta-delta slowing.   Patient was noted to have a seizure on 07/24/1020 at 2008 during which patient  woke up from sleep, was noted to have subtle whole-body twitching followed by gaze and head deviation to left, average about 1 minute 15 seconds.  Concomitant eeg showed right frontal rhythmic 3-4hz  theta-delta slowing admixed with periodic sharp waves in right frontal region at 1hz  which gradually increase in frequency to 8-9hz  alpha activity and involve left hemisphere.  Two seizures were noted on 07/24/2020 at 2206 and 2220.  During the seizure, patient was noted to be asleep in a dark room and therefore was difficult to visualize on the camera but was noted to have some limb movements under the sheet. Concomitant eeg showed right frontal rhythmic 3-4hz  theta-delta slowing admixed with periodic sharp waves in right frontal region at 1hz  which gradually increase in frequency to  8-9hz  alpha activity and involve left hemisphere.  One more seizure was noted on 07/25/2020 at 0901 during which she was noted to be laying in bed moving her head and left gaze and head deviation. Concomitant eeg showed right frontal rhythmic 3-4hz  theta-delta slowing admixed with periodic sharp waves in right frontal region at 1hz  which gradually increase in frequency to 8-9hz  alpha activity and involve left hemisphere.   ABNORMALITY - Seizure, right frontal region - Periodic epileptiform discharges, generalizedand maximal right frontal region - Continuousslow, generalized  IMPRESSION: This studyshowed four seizures as described above arising from right frontal region, last seizure on 07/25/2020 at 0901 . There is also evidence of generalized, maximal right frontal epileptogenicity likely due to underlying structural abnormality. Additionally there is moderate diffuse encephalopathy, non specific etiology.   EEG appears to be improving compared to previous day.  Susan [name] [name]

## 2020-07-26 ENCOUNTER — Other Ambulatory Visit: Payer: Self-pay | Admitting: *Deleted

## 2020-07-26 DIAGNOSIS — R4182 Altered mental status, unspecified: Secondary | ICD-10-CM | POA: Diagnosis not present

## 2020-07-26 DIAGNOSIS — R569 Unspecified convulsions: Secondary | ICD-10-CM | POA: Diagnosis not present

## 2020-07-26 DIAGNOSIS — G9341 Metabolic encephalopathy: Secondary | ICD-10-CM | POA: Diagnosis not present

## 2020-07-26 DIAGNOSIS — I10 Essential (primary) hypertension: Secondary | ICD-10-CM | POA: Diagnosis not present

## 2020-07-26 DIAGNOSIS — F250 Schizoaffective disorder, bipolar type: Secondary | ICD-10-CM | POA: Diagnosis not present

## 2020-07-26 DIAGNOSIS — K219 Gastro-esophageal reflux disease without esophagitis: Secondary | ICD-10-CM | POA: Diagnosis not present

## 2020-07-26 LAB — BLOOD GAS, VENOUS
Acid-base deficit: 2.5 mmol/L — ABNORMAL HIGH (ref 0.0–2.0)
Bicarbonate: 21 mmol/L (ref 20.0–28.0)
FIO2: 21
O2 Saturation: 94.2 %
Patient temperature: 36.3
pCO2, Ven: 30.4 mmHg — ABNORMAL LOW (ref 44.0–60.0)
pH, Ven: 7.451 — ABNORMAL HIGH (ref 7.250–7.430)
pO2, Ven: 66.7 mmHg — ABNORMAL HIGH (ref 32.0–45.0)

## 2020-07-26 LAB — COMPREHENSIVE METABOLIC PANEL
ALT: 18 U/L (ref 0–44)
AST: 24 U/L (ref 15–41)
Albumin: 3.2 g/dL — ABNORMAL LOW (ref 3.5–5.0)
Alkaline Phosphatase: 94 U/L (ref 38–126)
Anion gap: 16 — ABNORMAL HIGH (ref 5–15)
BUN: 19 mg/dL (ref 6–20)
CO2: 19 mmol/L — ABNORMAL LOW (ref 22–32)
Calcium: 8.8 mg/dL — ABNORMAL LOW (ref 8.9–10.3)
Chloride: 107 mmol/L (ref 98–111)
Creatinine, Ser: 0.73 mg/dL (ref 0.44–1.00)
GFR, Estimated: >60 mL/min (ref >60)
Glucose, Bld: 126 mg/dL — ABNORMAL HIGH (ref 70–99)
Potassium: 3.4 mmol/L — ABNORMAL LOW (ref 3.5–5.1)
Sodium: 142 mmol/L (ref 135–145)
Total Bilirubin: 1.9 mg/dL — ABNORMAL HIGH (ref 0.3–1.2)
Total Protein: 6.3 g/dL — ABNORMAL LOW (ref 6.5–8.1)

## 2020-07-26 LAB — VALPROIC ACID LEVEL: Valproic Acid Lvl: 55 ug/mL (ref 50.0–100.0)

## 2020-07-26 MED ORDER — SODIUM CHLORIDE 0.9 % IV SOLN: INTRAVENOUS | Status: DC

## 2020-07-26 MED ORDER — SODIUM CHLORIDE 0.9 % IV SOLN
750.0000 mg | Freq: Once | INTRAVENOUS | Status: AC
  Administered 2020-07-26: 750 mg via INTRAVENOUS
  Filled 2020-07-26: qty 5.77

## 2020-07-26 MED ORDER — PHENOBARBITAL SODIUM 65 MG/ML IJ SOLN: 750.0000 mg | Freq: Once | INTRAMUSCULAR | Status: DC

## 2020-07-26 MED ORDER — THIAMINE HCL 100 MG/ML IJ SOLN
Freq: Once | INTRAVENOUS | Status: AC
  Filled 2020-07-26: qty 1000

## 2020-07-26 NOTE — Progress Notes (Signed)
Patient continue poorly responsive, VBG with pH 7,45, pCO2 30. Clinically protecting her airway.  Continue close monitoring.

## 2020-07-26 NOTE — Progress Notes (Signed)
PROGRESS NOTE    Susan [name]  FOY:[id number] DOB: 1961-04-16 DOA: 07/20/2020 PCP: Hoyt Koch, MD    Brief Narrative:  Susan [name] was admitted to the hospital with a working diagnosis of acute metabolic encephalopathywith features of delirium and psychosis, complicated with new onset seizures.  59 year old female past medical history for schizoaffective disorder, bipolar, seizure disorder, type 2 diabetes mellitus, polysubstance abuse, HIV, history of CVA and a recent hospitalization for respiratory failure and encephalopathyMarch to May 2021, that requiredtrach and PEG (now reverted).  Patient was brought to the hospital due to severe agitation, approximately for about 24 hours, apparently patient has stopped taking her psychotropic medications for several weeks. On her initial physical examination blood pressure 157/93, heart rate 112, respiratory rate 32, temperature 98, oxygen saturation 94%, her lungs were clear to auscultation bilaterally, heart S1-S2, present rhythm, soft abdomen, no lower extremity edema. Dry mucous membranes, extreme agitation, moving all 4 extremities spontaneously. Not following commands or answering questions. Sodium 139, potassium 3.8, chloride 106, bicarb 24, glucose 188, BUN 12, creatinine 0.93, AST 12, ALT 10, white count 7.6, hemoglobin 16.1, hematocrit 48.6, platelets 240. Urinalysis negative for infection. SARS COVID-19 negative. Head CT with no acute changes. Multiple chronic infarcts. Chest radiograph no infiltrates. CT chest, abdomen pelvis, negative for dissection. EKG 53 bpm, normal axis, normal intervals, sinus rhythm, positive LVH, no ST segment T wave changes.  Patient was placed on physical restrains and on antipsychotic regimen.  No meningeal signs, no fever or leukocytosis, doubt patient has bacterial meningitis. She has been very agitated and likely will be very difficult to obtain lumbar puncture.  Initially holding  on brain MRI and EEG, due to agitation and poor cooperation.   Unable to take po due to cognitive impairment.  11/20 patient had a seizure with general clonic movements and loss of continence. Received lorazepam IV.  EEG positive for seizures and patient placed on antiepileptic regimen per neurology recommendations.   LTM EEG with continued intermittent seizures, epileptogenicity maximal in the right frontal region.    Assessment & Plan:   Principal Problem:   Altered mental status Active Problems:   Essential hypertension   Schizoaffective disorder, bipolar type (HCC)   GERD (gastroesophageal reflux disease)   Uncontrolled type 2 diabetes mellitus with hyperglycemia, with long-term current use of insulin (HCC)   Human immunodeficiency virus (HIV) disease (HCC)   Metabolic encephalopathy   1. Acute metabolic encephalopathy, with delirium and possible psychosis/NEW onsetseizure (persistent intermittent). TSH 0.867 Today at the time of my examination she is having repetitive lateral movements of her head, and right foot, eyes deviated to 1:00 o'clock. She is not responsive to pain or voice stimuli, not blinking to visual stimuli.    Clinically protecting her airway.   Last dose of Haloperidol on 11.21.21 at 1 am.   Continuous EEG monitoring   On soft 2 wrist restrains to protect IV lines and EEG monitoring, continue with neuro checks, aspiration and fall precautions.  Unable to get po due to aspiration risk related to encephalopathy. Continue daily IV vitamins including thiamine.   Continue with antiepileptic regimen with: Lacosamide, Keppra, and Valproic acid, as needed lorazepam (#0 doses).     2. HTN.Uncontrolled hypertension with systolic 767 and 209 mmHg, patient not able to tolerate po, will continue as needed IV hydralazine and clonidine patch at 0.3 mg.    3. Uncontrolled T2DMHgb A1c 7,7.fasting glucose is 126 this am, continue with insulin sliding scale  for glucose cover and  monitoring. Patient not able to tolerate po diet due to encephalopathy.   4. HIV. Unable to ger her antiretroviral medications due to cognitive impairment.   5. GERD.On IVpantoprazole.   6. Schizophrenia. At home on benztropine, buspirone, doxepin, hydroxyzine, quetiapine and sertraline.   Her home medications have been ordered, but patient not able to tolerate po due to cognitive impairtment. Not able to get po medications.   5. Hypokalemia with anion gap metabolic acidosis. Renal function with serum cr at 0,73, K 3.4, bicarbonate is 19 and Na 142.   Continue with IV fluids isotonic saline, "banana bag" with 40 Kcl, follow up on renal function in am  Likely anion gap metabolic acidosis, likely due to persistent seizures.   Patient continue to be at high risk for worsening seizures and status epilepticus.   Status is: Inpatient  Remains inpatient appropriate because:IV treatments appropriate due to intensity of illness or inability to take PO   Dispo: The patient is from: Home              Anticipated d/c is to: Home              Anticipated d/c date is: > 3 days              Patient currently is not medically stable to d/c.    DVT prophylaxis: Enoxaparin   Code Status:   full  Family Communication:  No family at the bedside       Consultants:   Neurology   Subjective: Patient this am is not responsive she is having clonic movements of her head and left leg. Continue unable to get any po intake. No vomiting. Continue on restrains bilateral wrists.   Objective: Vitals:   07/25/20 0500 07/25/20 0814 07/25/20 1421 07/25/20 2020  BP: (!) 162/80 (!) 169/80 (!) 211/106 (!) 198/90  Pulse: 74 63 65 72  Resp: 20 18 16 14   Temp: 98 F (36.7 C) 98.2 F (36.8 C) 98.2 F (36.8 C) 98 F (36.7 C)  TempSrc: Oral Oral Axillary Oral  SpO2: 100% 100% 100% 99%  Weight:      Height:        Intake/Output Summary (Last 24 hours) at 07/26/2020  0829 Last data filed at 07/26/2020 0800 Gross per 24 hour  Intake 484.12 ml  Output 800 ml  Net -315.88 ml   Filed Weights   07/21/20 1934  Weight: 81 kg    Examination:   General: deconditioned and ill looking appearing  Neurology: tonic movements of her head and left foot, gaze at 1:00 o'clock not responsive to touch or pain stimuli, not blinking to visual stimuli.  E ENT: mild pallor, no icterus, oral mucosa moist Cardiovascular: No JVD. S1-S2 present, rhythmic, no gallops, rubs, or murmurs. No lower extremity edema. Pulmonary: positive breath sounds bilaterally, with no wheezing, rhonchi or rales. Gastrointestinal. Abdomen soft and non tender Skin. No rashes Musculoskeletal: no joint deformities     Data Reviewed: I have personally reviewed following labs and imaging studies  CBC: Recent Labs  Lab 07/20/20 1529 07/20/20 1545 07/23/20 0543 07/24/20 1011 07/25/20 0440  WBC 7.6  --  14.1* 9.6 9.2  NEUTROABS 3.7  --  9.6* 5.2 4.6  HGB 16.1* 16.3* 17.7* 17.4* 16.7*  HCT 48.6* 48.0* 52.2* 51.4* 50.5*  MCV 86.8  --  84.5 85.4 86.3  PLT 240  --  186 173 939   Basic Metabolic Panel: Recent Labs  Lab 07/22/20 1614 07/23/20 0420 07/24/20  9628 07/25/20 0440 07/26/20 0355  NA 141 140 141 144 142  K 3.8 3.8 5.4* 3.6 3.4*  CL 108 104 110 111 107  CO2 19* 19* 20* 17* 19*  GLUCOSE 143* 165* 153* 120* 126*  BUN 19 24* 23* 24* 19  CREATININE 0.88 0.92 0.75 0.79 0.73  CALCIUM 9.2 9.4 8.7* 8.8* 8.8*   GFR: Estimated Creatinine Clearance: 82.3 mL/min (by C-G formula based on SCr of 0.73 mg/dL). Liver Function Tests: Recent Labs  Lab 07/20/20 1529 07/23/20 0420 07/26/20 0355  AST 12* 22 24  ALT 10 QUANTITY NOT SUFFICIENT, UNABLE TO PERFORM TEST 18  ALKPHOS 111 118 94  BILITOT 0.4 QUANTITY NOT SUFFICIENT, UNABLE TO PERFORM TEST 1.9*  PROT 6.1* 7.4 6.3*  ALBUMIN 3.0* 3.7 3.2*   Recent Labs  Lab 07/20/20 1529  LIPASE 18   Recent Labs  Lab 07/20/20 1537    AMMONIA 53*   Coagulation Profile: Recent Labs  Lab 07/20/20 1529  INR 0.9   Cardiac Enzymes: No results for input(s): CKTOTAL, CKMB, CKMBINDEX, TROPONINI in the last 168 hours. BNP (last 3 results) No results for input(s): PROBNP in the last 8760 hours. HbA1C: No results for input(s): HGBA1C in the last 72 hours. CBG: Recent Labs  Lab 07/24/20 2122 07/25/20 0740 07/25/20 1128 07/25/20 1638 07/25/20 2022  GLUCAP 123* 121* 111* 120* 130*   Lipid Profile: No results for input(s): CHOL, HDL, LDLCALC, TRIG, CHOLHDL, LDLDIRECT in the last 72 hours. Thyroid Function Tests: No results for input(s): TSH, T4TOTAL, FREET4, T3FREE, THYROIDAB in the last 72 hours. Anemia Panel: No results for input(s): VITAMINB12, FOLATE, FERRITIN, TIBC, IRON, RETICCTPCT in the last 72 hours.    Radiology Studies: I have reviewed all of the imaging during this hospital visit personally     Scheduled Meds: .  stroke: mapping our early stages of recovery book   Does not apply Once  . benztropine  0.5 mg Oral QHS  . bictegravir-emtricitabine-tenofovir AF  1 tablet Oral Daily  . [START ON 07/28/2020] cloNIDine  0.3 mg Transdermal Weekly  . doxepin  50 mg Oral QHS  . enoxaparin (LOVENOX) injection  40 mg Subcutaneous Daily  . hydrOXYzine  50 mg Oral TID  . pantoprazole (PROTONIX) IV  40 mg Intravenous Q24H  . QUEtiapine  200 mg Oral BID  . sertraline  50 mg Oral QHS  . sodium chloride flush  10-40 mL Intracatheter Q12H  . sodium chloride flush  3 mL Intravenous Once   Continuous Infusions: . lacosamide (VIMPAT) IV 150 mg (07/25/20 2340)  . levETIRAcetam 1,500 mg (07/26/20 0751)  . valproate sodium 500 mg (07/26/20 0558)     LOS: 4 days        Zev Blue Gerome Apley, MD

## 2020-07-26 NOTE — Progress Notes (Signed)
Multiple electrodes maint'd PRN - no skin breakdown at these sites. Push button texted w [facility]

## 2020-07-26 NOTE — Progress Notes (Signed)
OT Cancellation Note  Patient Details Name: Susan [name] MRN: [id number] DOB: 1961-01-05   Cancelled Treatment:    Reason Eval/Treat Not Completed: Patient at procedure or test/ unavailable;Patient's level of consciousness. Patient currently getting EEG test. Patient's eyes open, but non-responsive.  Will continue to follow and monitor for appropriateness.  [place] 07/26/2020, 10:02 AM   Jesse Sans OTR/L Acute Rehabilitation Services Pager: [phone] Office: [phone]

## 2020-07-26 NOTE — Progress Notes (Signed)
PT Cancellation Note  Patient Details Name: LAQUAN LUDDEN MRN: [id number] DOB: 1961-04-01   Cancelled Treatment:    Reason Eval/Treat Not Completed: Medical issues which prohibited therapy. Patient currently getting EEG test. Patient's eyes open, but non-responsive.  Will continue to follow and monitor for appropriateness.   Raynelle Fujikawa 07/26/2020, 9:26 AM

## 2020-07-26 NOTE — Progress Notes (Signed)
LTM maintenance completed; no skin breakdown was seen; reprepped under O1, O2, Fp1, Fp2, F3, T7, A1, and F8.

## 2020-07-26 NOTE — Procedures (Addendum)
Patient Name:Susan [name] LVD:[id number] Epilepsy Attending:Tane Biegler [name] Referring Physician/Provider:DrEric Lindzen Duration:07/25/2020 1401 to 11/23/20211401  Patient history:59 y.o.femalePMHx of old strokes/TIAs, schizophrenia, IDDM II, anxiety and depressionpresented with ams. EEG to evaluate for seizure  Level of alertness:awake, asleep  AEDs during EEG study:VPA, keppra, LCM  Technical aspects: This EEG study was done with scalp electrodes positioned according to the 10-20 International system of electrode placement. Electrical activity was acquired at a sampling rate of 500Hz  and reviewed with a high frequency filter of 70Hz  and a low frequency filter of 1Hz . EEG data were recorded continuously and digitally stored.   Description:Noposterior dominant rhythm was seen. Sleep was characterized by sleep spindles (12 to 14 Hz), maximal frontocentral region.  EEG showedindependent left and right frontocentral sharp waves, at times periodic at 0.5 to 1 Hz as well as continuous generalized 3 to 6 Hz theta-delta slowing.  Patient was noted to have a seizure on 07/25/2020 at [place], [place], 1655, 1858, 1919, 2048 and on 07/26/2020 at 0730 during which patient was noted to have subtle whole-body twitching followed by gaze and head deviation to left, average about 1 minute. Concomitant eegshowed right frontal rhythmic 3-4hz  theta-delta slowing admixed with periodic sharp waves in right frontal region at 1hz  which gradually increase in frequency to 8-9hz  alpha activity and involve left hemisphere.  ABNORMALITY - Seizure, right frontal region - Sharp waves, left independent frontal frontocentral region - Continuousslow, generalized  IMPRESSION: This studyshowedmultiple seizuresasdescribed abovearising from right frontal region, last seizure on 07/26/2020 at 0730. There is alsoevidence of independent left and right epileptogenicity likely secondary to  underlying stroke.Additionally there ismoderate diffuse encephalopathy, non specific etiology.    Susan [name] [name]

## 2020-07-26 NOTE — Patient Outreach (Addendum)
[facility]) Care Management  07/26/2020  Susan [name] 07-24-1961 [id number]   RN Health Coach Hospitalization   Outreach Attempt:  Notified by [facility] Liaison that patient is hospitalized at [facility].  Plan:  RN Health Coach will close Disease Management case per [place] request.  Hubert Azure RN [place] [phone] [email or web address]

## 2020-07-27 ENCOUNTER — Ambulatory Visit: Payer: Self-pay | Admitting: *Deleted

## 2020-07-27 ENCOUNTER — Telehealth: Payer: Self-pay | Admitting: Internal Medicine

## 2020-07-27 DIAGNOSIS — I10 Essential (primary) hypertension: Secondary | ICD-10-CM | POA: Diagnosis not present

## 2020-07-27 DIAGNOSIS — R569 Unspecified convulsions: Secondary | ICD-10-CM | POA: Diagnosis not present

## 2020-07-27 DIAGNOSIS — G9341 Metabolic encephalopathy: Secondary | ICD-10-CM | POA: Diagnosis not present

## 2020-07-27 DIAGNOSIS — R4182 Altered mental status, unspecified: Secondary | ICD-10-CM | POA: Diagnosis not present

## 2020-07-27 DIAGNOSIS — K219 Gastro-esophageal reflux disease without esophagitis: Secondary | ICD-10-CM | POA: Diagnosis not present

## 2020-07-27 DIAGNOSIS — B20 Human immunodeficiency virus [HIV] disease: Secondary | ICD-10-CM | POA: Diagnosis not present

## 2020-07-27 LAB — BASIC METABOLIC PANEL
Anion gap: 15 (ref 5–15)
BUN: 17 mg/dL (ref 6–20)
CO2: 16 mmol/L — ABNORMAL LOW (ref 22–32)
Calcium: 8.6 mg/dL — ABNORMAL LOW (ref 8.9–10.3)
Chloride: 111 mmol/L (ref 98–111)
Creatinine, Ser: 0.68 mg/dL (ref 0.44–1.00)
GFR, Estimated: >60 mL/min (ref >60)
Glucose, Bld: 114 mg/dL — ABNORMAL HIGH (ref 70–99)
Potassium: 3.5 mmol/L (ref 3.5–5.1)
Sodium: 142 mmol/L (ref 135–145)

## 2020-07-27 LAB — PHENOBARBITAL LEVEL: Phenobarbital: 13.8 ug/mL — ABNORMAL LOW (ref 15.0–30.0)

## 2020-07-27 LAB — MAGNESIUM: Magnesium: 1.9 mg/dL (ref 1.7–2.4)

## 2020-07-27 LAB — VALPROIC ACID LEVEL: Valproic Acid Lvl: 58 ug/mL (ref 50.0–100.0)

## 2020-07-27 MED ORDER — PHENOBARBITAL SODIUM 65 MG/ML IJ SOLN
65.0000 mg | Freq: Every day | INTRAMUSCULAR | Status: DC
  Administered 2020-07-27 – 2020-07-28 (×2): 65 mg via INTRAVENOUS
  Filled 2020-07-27 (×2): qty 1

## 2020-07-27 NOTE — Progress Notes (Signed)
PROGRESS NOTE  NEHA WAIGHT GEZ:[id number] DOB: 1961/04/14 DOA: 07/20/2020 PCP: Hoyt Koch, MD  Brief History   Mrs. Dubin was admitted to the hospital with a working diagnosis of acute metabolic encephalopathywith features of delirium and psychosis, complicated with new onset seizures.  59 year old female past medical history for schizoaffective disorder, bipolar, seizure disorder, type 2 diabetes mellitus, polysubstance abuse, HIV, history of CVA and a recent hospitalization for respiratory failure and encephalopathyMarch to May 2021, that requiredtrach and PEG (now reverted).  Patient was brought to the hospital due to severe agitation, approximately for about 24 hours, apparently patient has stopped taking her psychotropic medications for several weeks. On her initial physical examination blood pressure 157/93, heart rate 112, respiratory rate 32, temperature 98, oxygen saturation 94%, her lungs were clear to auscultation bilaterally, heart S1-S2, present rhythm, soft abdomen, no lower extremity edema. Dry mucous membranes, extreme agitation, moving all 4 extremities spontaneously. Not following commands or answering questions. Sodium 139, potassium 3.8, chloride 106, bicarb 24, glucose 188, BUN 12, creatinine 0.93, AST 12, ALT 10, white count 7.6, hemoglobin 16.1, hematocrit 48.6, platelets 240. Urinalysis negative for infection. SARS COVID-19 negative. Head CT with no acute changes. Multiple chronic infarcts. Chest radiograph no infiltrates. CT chest, abdomen pelvis, negative for dissection. EKG 53 bpm, normal axis, normal intervals, sinus rhythm, positive LVH, no ST segment T wave changes.  Patient was placed on physical restrains and on antipsychotic regimen.  No meningeal signs, no fever or leukocytosis, doubt patient has bacterial meningitis. She has been very agitated and likely will be very difficult to obtain lumbar puncture.  Initially holding on brain  MRI and EEG, due to agitation and poor cooperation.   Unable to take po due to cognitive impairment.  11/20 patient had a seizure with general clonic movements and loss of continence. Received lorazepam IV.  EEG positive for seizures and patient placed on antiepileptic regimen per neurology recommendations.   LTM EEG with continued intermittent seizures, epileptogenicity maximal in the right frontal region.  The patient is sleeping and undergoing continuous EEG. Not awakened on this visit.  Consultants  . Neurology  Procedures  . EEG  Antibiotics   Anti-infectives (From admission, onward)   Start     Dose/Rate Route Frequency Ordered Stop   07/21/20 1000  bictegravir-emtricitabine-tenofovir AF (BIKTARVY) 50-200-25 MG per tablet 1 tablet        1 tablet Oral Daily 07/21/20 0058      .  Subjective  The patient is sleeping and undergoing continuous EEG. Not awakened on this visit.  Objective   Vitals:  Vitals:   07/27/20 0432 07/27/20 1412  BP: (!) 179/92 (!) 214/104  Pulse: 87 67  Resp: 18 18  Temp: 97.6 F (36.4 C) 97.8 F (36.6 C)  SpO2: 100% 100%   Exam:  Constitutional:  . The patient is sleeping soundly. She is not awakened. No acute distress. Respiratory:  . No increased work of breathing. . No wheezes, rales, or rhonchi . No tactile fremitus Cardiovascular:  . Regular rate and rhythm . No murmurs, ectopy, or gallups. . No lateral PMI. No thrills. Abdomen:  . Abdomen is soft, non-tender, non-distended . No hernias, masses, or organomegaly . Normoactive bowel sounds.  Musculoskeletal:  . No cyanosis, clubbing, or edema Skin:  . No rashes, lesions, ulcers . palpation of skin: no induration or nodules Neurologic:  . I am unable to evaluate the patient's neurological status due to the patient's inability to cooperate with exam. Psychiatric:  I am unable to evaluate the patient's psychiatric status due to the patient's inability to cooperate with  exam.  I have personally reviewed the following:   Today's Data  . Vitals, BMP, Magnesium, Phenobarbital, Valproic acid, Glucose  Micro Data  . MRSa by PCR is positive.  Imaging  . CTA chest/abdomen  Scheduled Meds: .  stroke: mapping our early stages of recovery book   Does not apply Once  . benztropine  0.5 mg Oral QHS  . bictegravir-emtricitabine-tenofovir AF  1 tablet Oral Daily  . [START ON 07/28/2020] cloNIDine  0.3 mg Transdermal Weekly  . doxepin  50 mg Oral QHS  . enoxaparin (LOVENOX) injection  40 mg Subcutaneous Daily  . hydrOXYzine  50 mg Oral TID  . pantoprazole (PROTONIX) IV  40 mg Intravenous Q24H  . PHENObarbital  65 mg Intravenous QHS  . QUEtiapine  200 mg Oral BID  . sertraline  50 mg Oral QHS  . sodium chloride flush  10-40 mL Intracatheter Q12H  . sodium chloride flush  3 mL Intravenous Once   Continuous Infusions: . lacosamide (VIMPAT) IV 150 mg (07/27/20 1152)  . levETIRAcetam 1,500 mg (07/27/20 1050)  . valproate sodium 500 mg (07/27/20 1510)    Principal Problem:   Altered mental status Active Problems:   Essential hypertension   Schizoaffective disorder, bipolar type (HCC)   GERD (gastroesophageal reflux disease)   Uncontrolled type 2 diabetes mellitus with hyperglycemia, with long-term current use of insulin (HCC)   Human immunodeficiency virus (HIV) disease ([place])   Metabolic encephalopathy   LOS: 5 days   A & P   Acute metabolic encephalopathy, with delirium and possible psychosis/NEW onsetseizure (persistent intermittent). Today at the time of my examination she is having repetitive lateral movements of her head, and right foot, eyes deviated to 1:00 o'clock. She is not responsive to pain or voice stimuli, not blinking to visual stimuli.    Continuous EEG monitoring   On soft 2 wrist restrains to protect IV lines and EEG monitoring,continue with neuro checks, aspiration and fall precautions.  Unable to get po due to aspiration  risk related to encephalopathy. Continue daily IV vitamins including thiamine.   Continue with antiepileptic regimen with: Lacosamide, Keppra, and Valproic acid, as needed lorazepam (#0 doses).   Uncontrolled Hypertension in the setting of uncontrolled DM II. Currently being managed with as needed IV hydralazine and clonidine patch at 0.3 mg as she is not able to tolerate PO intake. The patient's Hgb A1c 7,7. Continue with insulin sliding scale for glucose cover and monitoring. Patient not able to tolerate po diet due to encephalopathy.   HIV: Noted. Unable to ger her antiretroviral medications due to cognitive impairment.   GERD: She is currently receiving IVpantoprazole.   Schizophrenia: Noted. At home on benztropine, buspirone, doxepin, hydroxyzine, quetiapine and sertraline. Plan to restart these as soon as the patient is able to tolerate PO intake safely.  Hypokalemia with anion gap metabolic acidosis:  Resolved. Renal function with serum cr at 0.68, K 3.5, bicarbonate is 16 and Na 142. Continue with IV fluids isotonic saline, "banana bag" with 40 Kcl, follow up on renal function in am. Will check CK due to concern for rhabdomyolysis due to persistent seizures.  Patient continue to be at high risk for worsening seizures and status epilepticus.   I have seen and examined this patient myself. I have spent 38 minutes in her evaluation and care.  DVT prophylaxis:      Enoxaparin  Code Status:              Full Code Family Communication:       None available.   Status is: Inpatient  Remains inpatient appropriate because:IV treatments appropriate due to intensity of illness or inability to take PO   Dispo: The patient is from: Home  Anticipated d/c is to: Home  Anticipated d/c date is: > 3 days  Patient currently is not medically stable to d/c.  Batul Diego, DO [facility] Direct contact: see [email or web address]  7PM-7AM contact  night coverage as above 07/27/2020, 7:42 PM  LOS: 5 days

## 2020-07-27 NOTE — Procedures (Signed)
Patient Name:Susan [name] XYB:[id number] Epilepsy Attending:Anysha Frappier [name] Referring Physician/Provider:DrEric Lindzen Duration:07/26/2020 1401 to 11/24/20211401  Patient history:59 y.o.femalePMHx of old strokes/TIAs, schizophrenia, IDDM II, anxiety and depressionpresented with ams. EEG to evaluate for seizure  Level of alertness:awake, asleep  AEDs during EEG study:VPA, keppra, LCM  Technical aspects: This EEG study was done with scalp electrodes positioned according to the 10-20 International system of electrode placement. Electrical activity was acquired at a sampling rate of 500Hz  and reviewed with a high frequency filter of 70Hz  and a low frequency filter of 1Hz . EEG data were recorded continuously and digitally stored.   Description:Noposterior dominant rhythm was seen.Sleep was characterized by sleep spindles (12 to 14 Hz), maximal frontocentral region. EEG showedindependent left and right frontocentral sharp waves, at times periodic at 0.5 to 1 Hz as well as continuous generalized 3 to 6 Hz theta-delta slowing.  ABNORMALITY - Sharp waves, left independent frontal frontocentral region - Continuousslow, generalized  IMPRESSION: This studyshowedevidence of independent left and right epileptogenicity likely secondary to underlying stroke.Additionally there ismoderate diffuse encephalopathy, non specific etiology. No definite seizures were seen during the study.   [name] [name]

## 2020-07-27 NOTE — Progress Notes (Signed)
Subjective: No clinical seizures overnight.  Patient more awake and today, repeatedly saying thank you, smiling.  ROS: Unable to obtain due to poor mental status  Examination  Vital signs in last 24 hours: Temp:  [97.4 F (36.3 C)-97.8 F (36.6 C)] 97.8 F (36.6 C) (11/24 1412) Pulse Rate:  [67-91] 67 (11/24 1412) Resp:  [16-18] 18 (11/24 1412) BP: (157-214)/(92-104) 214/104 (11/24 1412) SpO2:  [100 %] 100 % (11/24 1412)  General: lying in bed, not in apparent distress CVS: pulse-normal rate and rhythm RS: breathing comfortably, CTA B Extremities: normal, warm Neuro: Awake, alert, looks at the examiner, pleasantly smiling, keeps repeating the words thank you, does not follow commands, does not mimic, PERRLA, blinks to threat bilaterally, no apparent facial asymmetry, spontaneously moving all 4 extremities in bed.  Basic Metabolic Panel: Recent Labs  Lab 07/23/20 0420 07/23/20 0420 07/24/20 0607 07/24/20 0607 07/25/20 0440 07/26/20 0355 07/27/20 0155 07/27/20 1110  NA 140  --  141  --  144 142 142  --   K 3.8  --  5.4*  --  3.6 3.4* 3.5  --   CL 104  --  110  --  111 107 111  --   CO2 19*  --  20*  --  17* 19* 16*  --   GLUCOSE 165*  --  153*  --  120* 126* 114*  --   BUN 24*  --  23*  --  24* 19 17  --   CREATININE 0.92  --  0.75  --  0.79 0.73 0.68  --   CALCIUM 9.4   < > 8.7*   < > 8.8* 8.8* 8.6*  --   MG  --   --   --   --   --   --   --  1.9   < > = values in this interval not displayed.    CBC: Recent Labs  Lab 07/20/20 1529 07/20/20 1545 07/23/20 0543 07/24/20 1011 07/25/20 0440  WBC 7.6  --  14.1* 9.6 9.2  NEUTROABS 3.7  --  9.6* 5.2 4.6  HGB 16.1* 16.3* 17.7* 17.4* 16.7*  HCT 48.6* 48.0* 52.2* 51.4* 50.5*  MCV 86.8  --  84.5 85.4 86.3  PLT 240  --  186 173 151     Coagulation Studies: No results for input(s): LABPROT, INR in the last 72 hours.  Imaging No new brain imaging overnight  ASSESSMENT AND PLAN: 59 year old female with past medical  history of CVA, schizophrenia, type 2 diabetes who presented with staring and speech arrest as well as possible right-sided weakness and neglect.  EEG showed generalized periodic epileptiform discharges, maximal right frontal region.   New onset epilepsy Aphasia Chronic strokes Acute encephalopathy, postictal/ictal Type 2 diabetes Schizophrenia -LTM EEG did not show any further seizures. -Seizures likely secondary to underlying strokes, unclear precipitating factor.  Low suspicion for infection as patient has been afebrile with no leukocytosis, no evidence of UTI, normal chest x-ray -Aphasia likely due to ictal/postictal state.  Recommendation -Continue Keppra 1500 mg twice daily, VPA 500 milligrams every 8 hours, Vimpat 150 mg twice daily -We will start phenobarb 65 mg nightly, can increase to 130 mg nightly if any further seizures -Continue LTM EEG till patient is seizure-free for about 24 hours -We will consider obtaining MRI brain if needed after LTM EEG.  Patient is confused, therefore might be difficult study to obtain -Continue seizure precautions - PRN IV Ativan 2 mg for clinical seizure lasting  more than 2 minutes -Management of rest of comorbidities per primary team  I have spent a total of 35 minuteswith the patient reviewing hospitalnotes,  test results, labs and examining the patient as well as establishing an assessment and plan.>50% of time was spent in direct patient care.    Zeb Comfort Epilepsy [facility] For questions after 5pm please refer to AMION to reach the Neurologist on call

## 2020-07-27 NOTE — Progress Notes (Signed)
LTM maint complete - no skin breakdown under: CZ,T8,P8

## 2020-07-27 NOTE — Telephone Encounter (Signed)
   [employer] calling to make [place] aware the patient has been admitting at [facility] since 11.17.21

## 2020-07-27 NOTE — Progress Notes (Signed)
Subjective: Patient with left gaze deviation, not looking at examiner, not following commands, nonverbal.  ROS: Unable to obtain due to poor mental status  Examination  Vital signs in last 24 hours: Temp:  [97.4 F (36.3 C)-97.8 F (36.6 C)] 97.8 F (36.6 C) (11/24 1412) Pulse Rate:  [67-91] 67 (11/24 1412) Resp:  [16-18] 18 (11/24 1412) BP: (157-214)/(92-104) 214/104 (11/24 1412) SpO2:  [100 %] 100 % (11/24 1412)  General: lying in bed,not in apparent distress CVS: pulse-normal rate and rhythm RS: breathing comfortably,CTA B Extremities: normal,warm Neuro:Laying in bed with left gaze deviation, unable to cross midline to right, nonverbal, not following commands, spontaneously moving all 4 extremities  Basic Metabolic Panel: Recent Labs  Lab 07/23/20 0420 07/23/20 0420 07/24/20 0607 07/24/20 0607 07/25/20 0440 07/26/20 0355 07/27/20 0155 07/27/20 1110  NA 140  --  141  --  144 142 142  --   K 3.8  --  5.4*  --  3.6 3.4* 3.5  --   CL 104  --  110  --  111 107 111  --   CO2 19*  --  20*  --  17* 19* 16*  --   GLUCOSE 165*  --  153*  --  120* 126* 114*  --   BUN 24*  --  23*  --  24* 19 17  --   CREATININE 0.92  --  0.75  --  0.79 0.73 0.68  --   CALCIUM 9.4   < > 8.7*   < > 8.8* 8.8* 8.6*  --   MG  --   --   --   --   --   --   --  1.9   < > = values in this interval not displayed.    CBC: Recent Labs  Lab 07/20/20 1529 07/20/20 1545 07/23/20 0543 07/24/20 1011 07/25/20 0440  WBC 7.6  --  14.1* 9.6 9.2  NEUTROABS 3.7  --  9.6* 5.2 4.6  HGB 16.1* 16.3* 17.7* 17.4* 16.7*  HCT 48.6* 48.0* 52.2* 51.4* 50.5*  MCV 86.8  --  84.5 85.4 86.3  PLT 240  --  186 173 151     Coagulation Studies: No results for input(s): LABPROT, INR in the last 72 hours.  Imaging No new brain imaging overnight  ASSESSMENT AND PLAN: 59 year old female with past medical history of CVA, schizophrenia, type 2 diabetes who presented with staring and speech arrest as well as  possible right-sided weakness and neglect. EEG showed generalized periodic epileptiform discharges, maximal right frontal region.   New onset epilepsy Aphasia Chronic strokes Acute encephalopathy, postictal/ictal Type 2 diabetes Schizophrenia -LTM EEG  showed more seizures overnight. -Seizures likely secondary to underlying strokes,unclear precipitating factor.Low suspicion for infection as patient has been afebrile with no leukocytosis,no evidence of UTI,normal chest x-ray -Aphasia likely due to ictal/postictal state.  Recommendation -Continue Keppra 1500 mg twice daily, VPA 500 milligrams every 8 hours, Vimpat 150 mg twice daily -We will load patient with IV phenobarbital due to continued seizures -Continue LTM EEG till patient is seizure-free for about 24 hours -We will consider obtaining MRI brain if needed after LTM EEG.  Patient is confused, therefore might be difficult study to obtain -Continue seizure precautions - PRNIV Ativan 2 mg for clinical seizure lasting more than 2 minutes -Management of rest of comorbidities per primary team  I have spent a total of19minuteswith the patient reviewing hospitalnotes, test results, labs and examining the patient as well as establishing an  assessment and plan.>50% of time was spent in direct patient care.    Zeb Comfort Epilepsy [facility] For questions after 5pm please refer to AMION to reach the Neurologist on call

## 2020-07-28 DIAGNOSIS — I10 Essential (primary) hypertension: Secondary | ICD-10-CM | POA: Diagnosis not present

## 2020-07-28 DIAGNOSIS — K219 Gastro-esophageal reflux disease without esophagitis: Secondary | ICD-10-CM | POA: Diagnosis not present

## 2020-07-28 DIAGNOSIS — G9341 Metabolic encephalopathy: Secondary | ICD-10-CM | POA: Diagnosis not present

## 2020-07-28 DIAGNOSIS — R4182 Altered mental status, unspecified: Secondary | ICD-10-CM | POA: Diagnosis not present

## 2020-07-28 DIAGNOSIS — R569 Unspecified convulsions: Secondary | ICD-10-CM | POA: Diagnosis not present

## 2020-07-28 DIAGNOSIS — B20 Human immunodeficiency virus [HIV] disease: Secondary | ICD-10-CM | POA: Diagnosis not present

## 2020-07-28 LAB — COMPREHENSIVE METABOLIC PANEL
ALT: 19 U/L (ref 0–44)
AST: 22 U/L (ref 15–41)
Albumin: 3.1 g/dL — ABNORMAL LOW (ref 3.5–5.0)
Alkaline Phosphatase: 74 U/L (ref 38–126)
Anion gap: 13 (ref 5–15)
BUN: 14 mg/dL (ref 6–20)
CO2: 20 mmol/L — ABNORMAL LOW (ref 22–32)
Calcium: 8.8 mg/dL — ABNORMAL LOW (ref 8.9–10.3)
Chloride: 110 mmol/L (ref 98–111)
Creatinine, Ser: 0.72 mg/dL (ref 0.44–1.00)
GFR, Estimated: >60 mL/min (ref >60)
Glucose, Bld: 121 mg/dL — ABNORMAL HIGH (ref 70–99)
Potassium: 3.1 mmol/L — ABNORMAL LOW (ref 3.5–5.1)
Sodium: 143 mmol/L (ref 135–145)
Total Bilirubin: 1.6 mg/dL — ABNORMAL HIGH (ref 0.3–1.2)
Total Protein: 5.8 g/dL — ABNORMAL LOW (ref 6.5–8.1)

## 2020-07-28 LAB — CBC WITH DIFFERENTIAL/PLATELET
Abs Immature Granulocytes: 0.07 10*3/uL (ref 0.00–0.07)
Basophils Absolute: 0.1 10*3/uL (ref 0.0–0.1)
Basophils Relative: 1 %
Eosinophils Absolute: 0.4 10*3/uL (ref 0.0–0.5)
Eosinophils Relative: 5 %
HCT: 46.4 % — ABNORMAL HIGH (ref 36.0–46.0)
Hemoglobin: 16 g/dL — ABNORMAL HIGH (ref 12.0–15.0)
Immature Granulocytes: 1 %
Lymphocytes Relative: 30 %
Lymphs Abs: 2.9 10*3/uL (ref 0.7–4.0)
MCH: 29.2 pg (ref 26.0–34.0)
MCHC: 34.5 g/dL (ref 30.0–36.0)
MCV: 84.7 fL (ref 80.0–100.0)
Monocytes Absolute: 0.5 10*3/uL (ref 0.1–1.0)
Monocytes Relative: 5 %
Neutro Abs: 5.6 10*3/uL (ref 1.7–7.7)
Neutrophils Relative %: 58 %
Platelets: 127 10*3/uL — ABNORMAL LOW (ref 150–400)
RBC: 5.48 MIL/uL — ABNORMAL HIGH (ref 3.87–5.11)
RDW: 13.9 % (ref 11.5–15.5)
WBC: 9.6 10*3/uL (ref 4.0–10.5)
nRBC: 0 % (ref 0.0–0.2)

## 2020-07-28 MED ORDER — LORAZEPAM 2 MG/ML IJ SOLN
2.0000 mg | INTRAMUSCULAR | Status: DC | PRN
  Administered 2020-07-29 – 2020-08-05 (×2): 2 mg via INTRAVENOUS
  Filled 2020-07-28 (×3): qty 1

## 2020-07-28 MED ORDER — POTASSIUM CHLORIDE 10 MEQ/100ML IV SOLN
10.0000 meq | INTRAVENOUS | Status: AC
  Administered 2020-07-28 (×4): 10 meq via INTRAVENOUS
  Filled 2020-07-28 (×4): qty 100

## 2020-07-28 NOTE — Progress Notes (Signed)
LTM EEG discontinued - no skin breakdown at [place]hook.   

## 2020-07-28 NOTE — Progress Notes (Signed)
PROGRESS NOTE  Susan [name] CNO:[id number] DOB: Oct 24, 1960 DOA: 07/20/2020 PCP: Hoyt Koch, MD  Brief History   Susan [name] was admitted to the hospital with a working diagnosis of acute metabolic encephalopathywith features of delirium and psychosis, complicated with new onset seizures.  59 year old female past medical history for schizoaffective disorder, bipolar, seizure disorder, type 2 diabetes mellitus, polysubstance abuse, HIV, history of CVA and a recent hospitalization for respiratory failure and encephalopathyMarch to May 2021, that requiredtrach and PEG (now reverted).  Patient was brought to the hospital due to severe agitation, approximately for about 24 hours, apparently patient has stopped taking her psychotropic medications for several weeks. On her initial physical examination blood pressure 157/93, heart rate 112, respiratory rate 32, temperature 98, oxygen saturation 94%, her lungs were clear to auscultation bilaterally, heart S1-S2, present rhythm, soft abdomen, no lower extremity edema. Dry mucous membranes, extreme agitation, moving all 4 extremities spontaneously. Not following commands or answering questions. Sodium 139, potassium 3.8, chloride 106, bicarb 24, glucose 188, BUN 12, creatinine 0.93, AST 12, ALT 10, white count 7.6, hemoglobin 16.1, hematocrit 48.6, platelets 240. Urinalysis negative for infection. SARS COVID-19 negative. Head CT with no acute changes. Multiple chronic infarcts. Chest radiograph no infiltrates. CT chest, abdomen pelvis, negative for dissection. EKG 53 bpm, normal axis, normal intervals, sinus rhythm, positive LVH, no ST segment T wave changes.  Patient was placed on physical restrains and on antipsychotic regimen.  No meningeal signs, no fever or leukocytosis, doubt patient has bacterial meningitis. She has been very agitated and likely will be very difficult to obtain lumbar puncture.  Initially holding on brain  MRI and EEG, due to agitation and poor cooperation.   Unable to take po due to cognitive impairment.  11/20 patient had a seizure with general clonic movements and loss of continence. Received lorazepam IV.  EEG positive for seizures and patient placed on antiepileptic regimen per neurology recommendations.   LTM EEG with continued intermittent seizures, epileptogenicity maximal in the right frontal region.  Neurology has discontinued continuous EEG. It is thought that the patient's recover from seizures is prolonged, because her seizures are complex and prolonged per neurology.  Consultants  . Neurology  Procedures  . EEG  Antibiotics   Anti-infectives (From admission, onward)   Start     Dose/Rate Route Frequency Ordered Stop   07/21/20 1000  bictegravir-emtricitabine-tenofovir AF (BIKTARVY) 50-200-25 MG per tablet 1 tablet        1 tablet Oral Daily 07/21/20 0058       Subjective  The patient is somnolent, but rousable. Daughter is at bedside. The patient is not verbally communicative.  Objective   Vitals:  Vitals:   07/28/20 1132 07/28/20 1232  BP: (!) 197/94 (!) 173/82  Pulse:  87  Resp:    Temp:    SpO2:     Exam:  Constitutional:  The patient is somnolent, but rousable.  Respiratory:  . No increased work of breathing. . No wheezes, rales, or rhonchi . No tactile fremitus Cardiovascular:  . Regular rate and rhythm . No murmurs, ectopy, or gallups. . No lateral PMI. No thrills. Abdomen:  . Abdomen is soft, non-tender, non-distended . No hernias, masses, or organomegaly . Normoactive bowel sounds.  Musculoskeletal:  . No cyanosis, clubbing, or edema Skin:  . No rashes, lesions, ulcers . palpation of skin: no induration or nodules Neurologic:  . I am unable to evaluate the patient's neurological status due to the patient's inability to cooperate with  exam. Psychiatric: I am unable to evaluate the patient's psychiatric status due to the  patient's inability to cooperate with exam.  I have personally reviewed the following:   Today's Data  . Vitals, CMP, Phenobarbital, Glucose  Micro Data  . MRSa by PCR is positive.  Imaging  . CTA chest/abdomen  Scheduled Meds: .  stroke: mapping our early stages of recovery book   Does not apply Once  . benztropine  0.5 mg Oral QHS  . bictegravir-emtricitabine-tenofovir AF  1 tablet Oral Daily  . cloNIDine  0.3 mg Transdermal Weekly  . doxepin  50 mg Oral QHS  . enoxaparin (LOVENOX) injection  40 mg Subcutaneous Daily  . hydrOXYzine  50 mg Oral TID  . pantoprazole (PROTONIX) IV  40 mg Intravenous Q24H  . PHENObarbital  65 mg Intravenous QHS  . QUEtiapine  200 mg Oral BID  . sertraline  50 mg Oral QHS  . sodium chloride flush  10-40 mL Intracatheter Q12H  . sodium chloride flush  3 mL Intravenous Once   Continuous Infusions: . lacosamide (VIMPAT) IV 150 mg (07/28/20 1008)  . levETIRAcetam 1,500 mg (07/28/20 0842)  . valproate sodium 500 mg (07/28/20 1459)    Principal Problem:   Altered mental status Active Problems:   Essential hypertension   Schizoaffective disorder, bipolar type ([place])   GERD (gastroesophageal reflux disease)   Uncontrolled type 2 diabetes mellitus with hyperglycemia, with long-term current use of insulin ([facility])   Human immunodeficiency virus (HIV) disease ([place])   Metabolic encephalopathy   LOS: 6 days   A & P   Acute metabolic encephalopathy, with delirium and possible psychosis/NEW onsetseizure (persistent intermittent). Today at the time of my examination she is having repetitive lateral movements of her head, and right foot, eyes deviated to 1:00 o'clock. She is not responsive to pain or voice stimuli, not blinking to visual stimuli. Continuous EEG monitoring has been discontinued by neurology.  It is thought that the patient's recover from seizures is prolonged, because her seizures are complex and prolonged per neurology.  On soft 2 wrist  restrains to protect IV lines. Continue neuro checks, aspiration and fall precautions. Unable to get po due to aspiration risk related to encephalopathy. Continue daily IV vitamins including thiamine.   Continue with antiepileptic regimen with: Lacosamide, Keppra, and Valproic acid, as needed lorazepam (#0 doses).   Uncontrolled Hypertension in the setting of uncontrolled DM II. Currently being managed with as needed IV hydralazine and clonidine patch at 0.3 mg as she is not able to tolerate PO intake. The patient's Hgb A1c 7,7. Continue with insulin sliding scale for glucose cover and monitoring. Patient not able to tolerate po diet due to encephalopathy.   HIV: Noted. Unable to ger her antiretroviral medications due to cognitive impairment.   GERD: She is currently receiving IVpantoprazole.   Schizophrenia: Noted. At home on benztropine, buspirone, doxepin, hydroxyzine, quetiapine and sertraline. Plan to restart these as soon as the patient is able to tolerate PO intake safely.  Hypokalemia with anion gap metabolic acidosis: Potassium 3.1 this morning. Anion gap is closing at 13.  Resolved. Renal function with serum cr at 0.72. Bicarbonate is improved at 20. Na is 143. Continue with IV fluids isotonic saline, "banana bag" with 40 Kcl, follow up on renal function in am. Will check CK due to concern for rhabdomyolysis due to persistent seizures.  Patient continue to be at high risk for worsening seizures and status epilepticus.   I have seen and examined  this patient myself. I have spent 35 minutes in her evaluation and care.  DVT prophylaxis:      Enoxaparin   Code Status:              Full Code Family Communication:       None available.   Status is: Inpatient  Remains inpatient appropriate because:IV treatments appropriate due to intensity of illness or inability to take PO  Dispo: The patient is from: Home  Anticipated d/c is to: Home   Anticipated d/c date is: > 3 days  Patient currently is not medically stable to d/c.  Boen Sterbenz, DO [facility] Direct contact: see [email or web address]  7PM-7AM contact night coverage as above 07/28/2020, 3:37 PM  LOS: 5 days

## 2020-07-28 NOTE — Progress Notes (Signed)
The patient has been in bilateral upper extremities soft restraints without an order. Per Dr. Felipa Furnace note at 7:27 pm on 11/24, he indicated continue soft restrain but forget to place the order. There's a Air cabin crew in the room starting at midnight and the restraints have been taken off the patient's bilateral wrist. Notified Dr. Tonie Griffith to place order for the restraint earlier before the sitter came in but he indicated he cannot back tract the order and as such RN should remind Dr. Benny Lennert AM to place the order. Will pass the information on to the oncoming RN to remind him. No acute distress noted. Will continue to monitor.

## 2020-07-28 NOTE — Procedures (Signed)
Patient Name:Susan [name] MBE:[id number] Epilepsy Attending:Terica Yogi [name] Referring Physician/Provider:DrEric Lindzen Duration:07/27/2020 1401 to 11/25/20211243  Patient history:59 y.o.femalePMHx of old strokes/TIAs, schizophrenia, IDDM II, anxiety and depressionpresented with ams. EEG to evaluate for seizure  Level of alertness:awake, asleep  AEDs during EEG study:VPA, keppra,LCM, Phenobarb  Technical aspects: This EEG study was done with scalp electrodes positioned according to the 10-20 International system of electrode placement. Electrical activity was acquired at a sampling rate of 500Hz  and reviewed with a high frequency filter of 70Hz  and a low frequency filter of 1Hz . EEG data were recorded continuously and digitally stored.   Description:Noposterior dominant rhythm was seen.Sleep was characterized by sleep spindles (12 to 14 Hz), maximal frontocentral region. EEG showedindependent left and right frontocentral sharp waves, at times periodic at 0.5 to 1 Hzas well as continuous generalized 3 to 6 Hz theta-delta slowing.  ABNORMALITY -Sharp waves, left independent frontal frontocentral region - Continuousslow, generalized  IMPRESSION: This studyshowedevidence ofindependent left and right epileptogenicity likely secondary to underlying stroke.Additionally there ismoderate diffuse encephalopathy, non specific etiology. No definite seizures were seen during the study.   Susan [name] [name]

## 2020-07-28 NOTE — Progress Notes (Addendum)
Subjective: No clinical seizures overnight.  Per RN patient stated that she needed to move earlier in the day.  Currently lying in bed, intermittently smiles but not speaking much.   ROS: Unable to obtain due to poor mental status  Examination  Vital signs in last 24 hours: Temp:  [97.5 F (36.4 C)-97.8 F (36.6 C)] 97.6 F (36.4 C) (11/25 0500) Pulse Rate:  [67-88] 88 (11/25 1003) Resp:  [18-20] 20 (11/25 1003) BP: (144-219)/(73-112) 197/94 (11/25 1132) SpO2:  [99 %-100 %] 100 % (11/25 1003) Weight:  [86.2 kg] 86.2 kg (11/25 0543)  General: lying in bed,not in apparent distress CVS: pulse-normal rate and rhythm RS: breathing comfortably,CTA B Extremities: normal,warm Neuro:Awake, alert, left gaze preference with right-sided hemineglect, but is able to cross midline and look at bedside intermittently throughout exam does not follow commands, does not mimic, PERRLA, blinks to threat bilaterally, no apparent facial asymmetry, spontaneously moving all 4 extremities in bed.   Basic Metabolic Panel: Recent Labs  Lab 07/24/20 0607 07/24/20 0607 07/25/20 0440 07/25/20 0440 07/26/20 0355 07/27/20 0155 07/27/20 1110 07/28/20 0520  NA 141  --  144  --  142 142  --  143  K 5.4*  --  3.6  --  3.4* 3.5  --  3.1*  CL 110  --  111  --  107 111  --  110  CO2 20*  --  17*  --  19* 16*  --  20*  GLUCOSE 153*  --  120*  --  126* 114*  --  121*  BUN 23*  --  24*  --  19 17  --  14  CREATININE 0.75  --  0.79  --  0.73 0.68  --  0.72  CALCIUM 8.7*   < > 8.8*   < > 8.8* 8.6*  --  8.8*  MG  --   --   --   --   --   --  1.9  --    < > = values in this interval not displayed.    CBC: Recent Labs  Lab 07/23/20 0543 07/24/20 1011 07/25/20 0440 07/28/20 0520  WBC 14.1* 9.6 9.2 9.6  NEUTROABS 9.6* 5.2 4.6 5.6  HGB 17.7* 17.4* 16.7* 16.0*  HCT 52.2* 51.4* 50.5* 46.4*  MCV 84.5 85.4 86.3 84.7  PLT 186 173 151 127*     Coagulation Studies: No results for input(s): LABPROT, INR in  the last 72 hours.  Imaging No new brain imaging overnight   ASSESSMENT AND PLAN: 59 year old female with past medical history of CVA, schizophrenia, type 2 diabetes who presented with staring and speech arrest as well as possible right-sided weakness and neglect. EEG showed generalized periodic epileptiform discharges, maximal right frontal region.   New onset epilepsy Aphasia Chronic strokes Acute encephalopathy, postictal/ictal Type 2 diabetes Schizophrenia -LTM EEG did not show any further seizures. -Seizures likely secondary to underlying strokes,unclear precipitating factor.Low suspicion for infection as patient has been afebrile with no leukocytosis,no evidence of UTI,normal chest x-ray -Aphasia likely due to ictal/postictal state.  Recommendation -Continue Keppra 1500 mg twice daily, VPA 500 milligrams every 8 hours, Vimpat 150 mg twice daily, phenobarb 65 mg nightly - Can increase phenobarbital to 130 mg nightly if any further seizures -Discontinue LTM EEG as patient has been seizure-free for about 24 hours -We will obtain MRI brain with and without contrast to look for any acute abnormality -Patient has significant stroke burden and had prolonged seizures.  Therefore might need  longer period of time for recovery -Continue seizure precautions - PRNIV Ativan 2 mg for clinical seizure lasting more than 2 minutes -Management of rest of comorbidities per primary team  I have spent a total of85minuteswith the patient reviewing hospitalnotes, test results, labs and examining the patient as well as establishing an assessment and plan.>50% of time was spent in direct patient care.   Zeb Comfort Epilepsy [facility] For questions after 5pm please refer to AMION to reach the Neurologist on call

## 2020-07-29 ENCOUNTER — Inpatient Hospital Stay: Payer: Self-pay

## 2020-07-29 ENCOUNTER — Inpatient Hospital Stay (HOSPITAL_COMMUNITY): Payer: Medicare Other

## 2020-07-29 DIAGNOSIS — K219 Gastro-esophageal reflux disease without esophagitis: Secondary | ICD-10-CM | POA: Diagnosis not present

## 2020-07-29 DIAGNOSIS — G9341 Metabolic encephalopathy: Secondary | ICD-10-CM | POA: Diagnosis not present

## 2020-07-29 DIAGNOSIS — I10 Essential (primary) hypertension: Secondary | ICD-10-CM | POA: Diagnosis not present

## 2020-07-29 DIAGNOSIS — I6783 Posterior reversible encephalopathy syndrome: Secondary | ICD-10-CM

## 2020-07-29 DIAGNOSIS — R4182 Altered mental status, unspecified: Secondary | ICD-10-CM | POA: Diagnosis not present

## 2020-07-29 DIAGNOSIS — B20 Human immunodeficiency virus [HIV] disease: Secondary | ICD-10-CM | POA: Diagnosis not present

## 2020-07-29 LAB — BASIC METABOLIC PANEL
Anion gap: 16 — ABNORMAL HIGH (ref 5–15)
BUN: 13 mg/dL (ref 6–20)
CO2: 18 mmol/L — ABNORMAL LOW (ref 22–32)
Calcium: 8.6 mg/dL — ABNORMAL LOW (ref 8.9–10.3)
Chloride: 110 mmol/L (ref 98–111)
Creatinine, Ser: 0.87 mg/dL (ref 0.44–1.00)
GFR, Estimated: >60 mL/min (ref >60)
Glucose, Bld: 104 mg/dL — ABNORMAL HIGH (ref 70–99)
Potassium: 3 mmol/L — ABNORMAL LOW (ref 3.5–5.1)
Sodium: 144 mmol/L (ref 135–145)

## 2020-07-29 LAB — GLUCOSE, CAPILLARY
Glucose-Capillary: 84 mg/dL (ref 70–99)
Glucose-Capillary: 102 mg/dL — ABNORMAL HIGH (ref 70–99)

## 2020-07-29 LAB — MAGNESIUM: Magnesium: 1.8 mg/dL (ref 1.7–2.4)

## 2020-07-29 LAB — PHOSPHORUS: Phosphorus: 3.4 mg/dL (ref 2.5–4.6)

## 2020-07-29 IMAGING — MR MR HEAD WO/W CM
14 of 16 series · 40 of 48 positions shown · IV contrast (Gadavist)
Comparison: Prior CT from [DATE] as well as most recent MRI
from [DATE].

CLINICAL DATA: Initial evaluation for acute seizure, abnormal neuro
exam.

EXAM:
MRI HEAD WITHOUT AND WITH CONTRAST
TECHNIQUE: Multiplanar, multiecho pulse sequences of the brain and surrounding
structures were obtained without and with intravenous contrast.
CONTRAST:  9mL GADAVIST GADOBUTROL 1 MMOL/ML IV SOLN

[Series 5: DWI · axial · 3.0mm · 0.88mm/px · z∈[-136,+10]mm · 6 of 100 slices shown (1 of 4)]
[im 1/100]
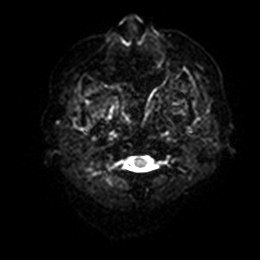
[im 20/100]
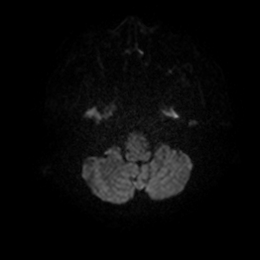
[im 40/100]
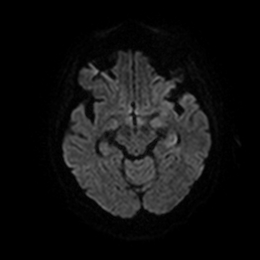
[im 60/100]
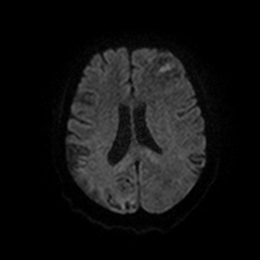
[im 80/100]
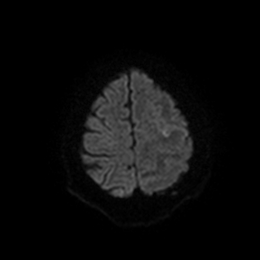
[im 100/100]
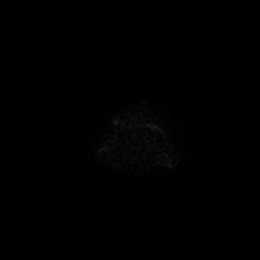

[Series 6: DWI · axial · 3.0mm · 0.88mm/px · z∈[-136,+10]mm · 2 of 50 slices shown (2 of 4)]
[im 1/50]
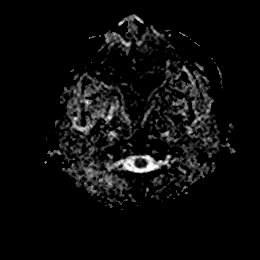
[im 50/50]
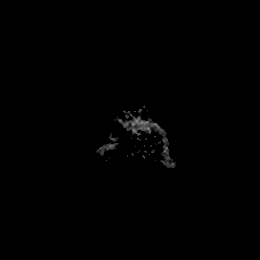

[Series 7: DWI · coronal · 4.0mm · 0.88mm/px · 4 of 68 slices shown (3 of 4)]
[im 1/68]
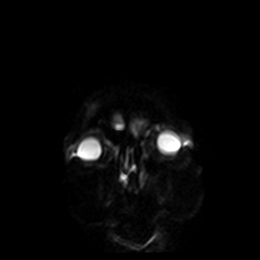
[im 23/68]
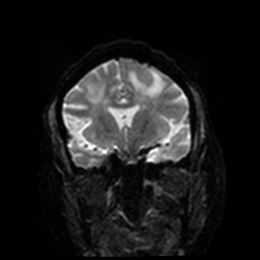
[im 45/68]
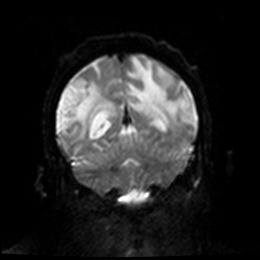
[im 68/68]
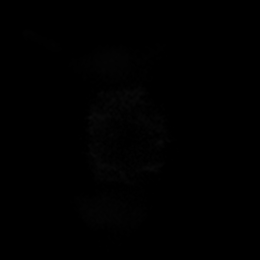

[Series 8: DWI · coronal · 4.0mm · 0.88mm/px · 1 of 34 slices shown (4 of 4)]
[im 1/34]
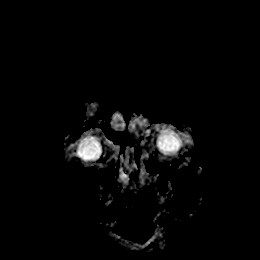

[Series 9: T2 · axial · 5.0mm · 0.72mm/px · z∈[-134,+9]mm · 2 of 25 slices shown]
[im 1/25]
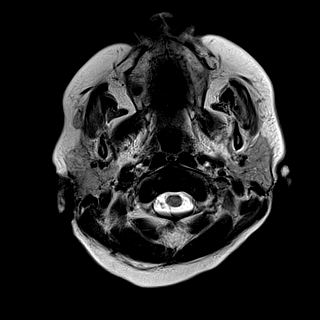
[im 25/25]
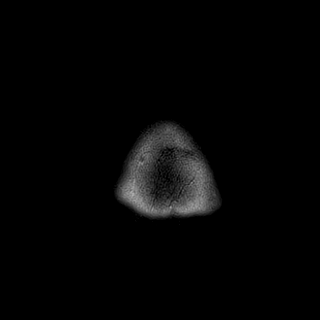

[Series 10: FLAIR · axial · 5.0mm · 0.90mm/px · z∈[-134,+9]mm · 2 of 25 slices shown]
[im 1/25]
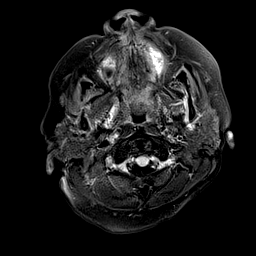
[im 25/25]
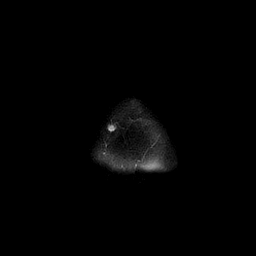

[Series 11: mag_images · axial · 3.0mm · 0.90mm/px · z∈[-136,+16]mm · 4 of 52 slices shown]
[im 1/52]
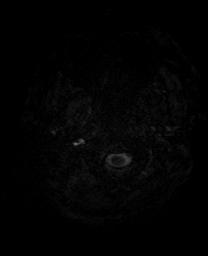
[im 18/52]
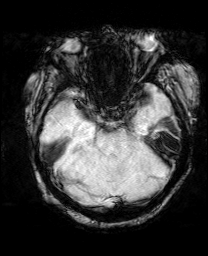
[im 35/52]
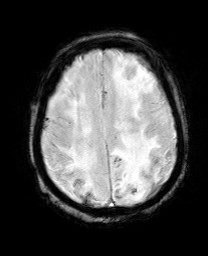
[im 52/52]
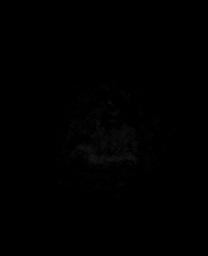

[Series 12: pha_images · axial · 3.0mm · 0.90mm/px · z∈[-136,+16]mm · 4 of 52 slices shown]
[im 1/52]
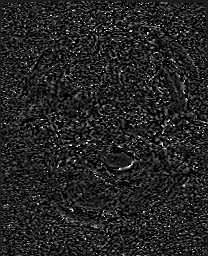
[im 18/52]
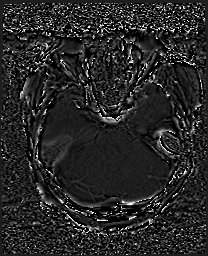
[im 35/52]
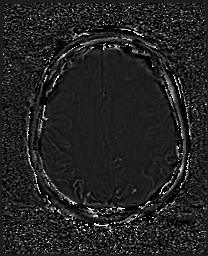
[im 52/52]
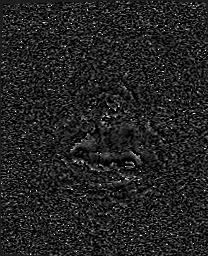

[Series 13: swi_images · axial · 3.0mm · 0.90mm/px · z∈[-136,+16]mm · 4 of 52 slices shown]
[im 1/52]
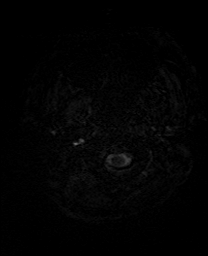
[im 18/52]
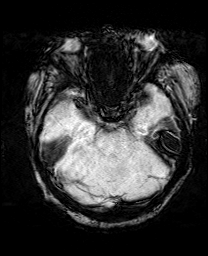
[im 35/52]
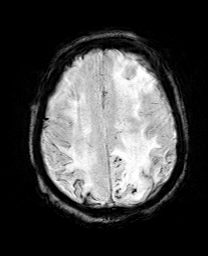
[im 52/52]
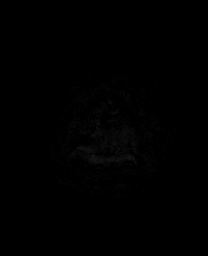

[Series 14: mip_images(sw) · axial · 24.0mm · 0.90mm/px · z∈[-126,+5]mm · 3 of 45 slices shown]
[im 1/45]
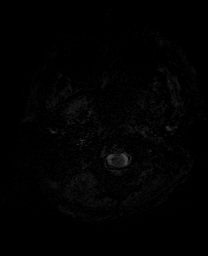
[im 23/45]
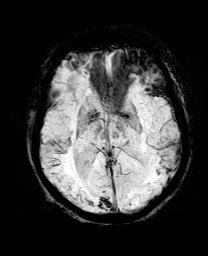
[im 45/45]
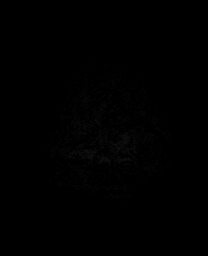

[Series 15: T1 · sagittal · 5.0mm · 0.75mm/px · 2 of 24 slices shown]
[im 1/24]
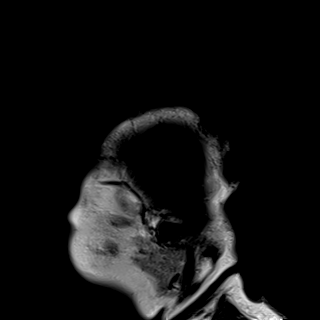
[im 24/24]
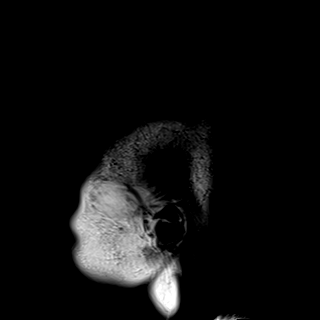

[Series 17: T2 post-contrast · coronal · 5.0mm · 0.72mm/px · 2 of 28 slices shown]
[im 1/28]
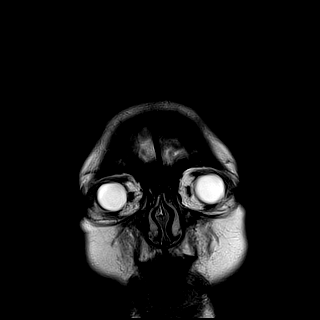
[im 28/28]
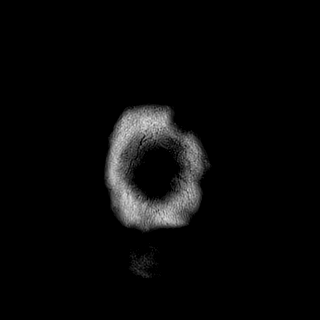

[Series 19: T1 post-contrast · coronal · 5.0mm · 0.34mm/px · 2 of 28 slices shown (1 of 2)]
[im 1/28]
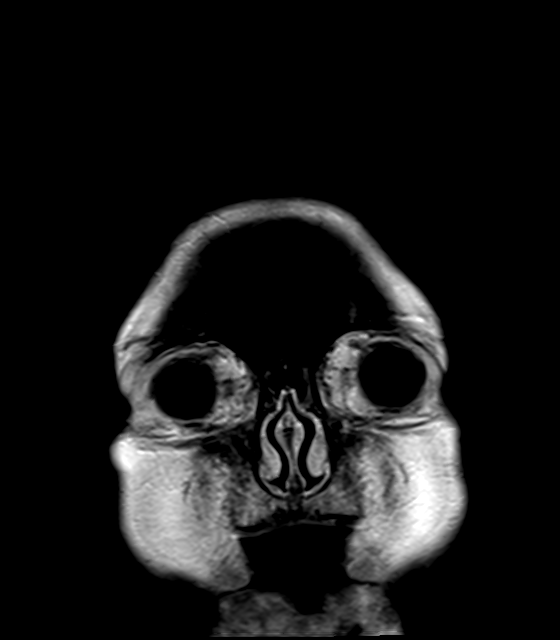
[im 28/28]
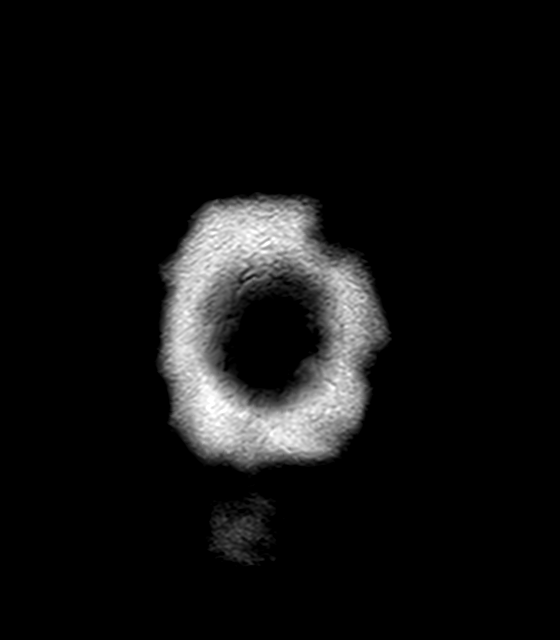

[Series 20: T1 post-contrast · sagittal · 5.0mm · 0.72mm/px · 2 of 24 slices shown (2 of 2)]
[im 1/24]
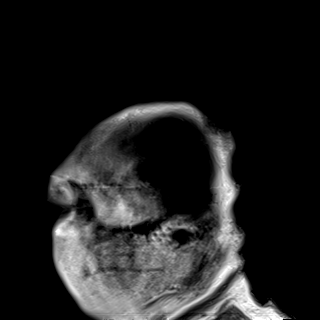
[im 24/24]
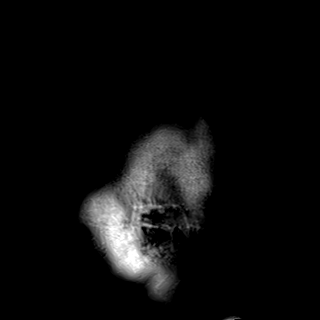

[40 of 48 positions shown; findings below may reference images not displayed]

FINDINGS: Brain: Cerebral volume stable since previous exam. Multifocal areas
of encephalomalacia and gliosis involving the bilateral parietal
regions consistent with chronic posterior MCA territory infarcts.
Additional chronic right PCA and/or PCA/MCA watershed distribution
infarct noted at the parasagittal right parietal lobe as well.
Scattered areas of associated laminar necrosis and/or chronic
hemosiderin staining noted within these regions.

There has been interval development of extensive fairly widespread
abnormal T2/FLAIR signal abnormality involving both cerebral
hemispheres, left greater than right. The CNS white matter is
primarily involved, with involvement of both frontal and parietal
regions. Patchy involvement extends into the left basal ganglia.
Abnormal T2/FLAIR signal abnormality noted within the right greater
than left cerebellar hemispheres as well. Scattered areas of patchy
diffusion abnormality and enhancement seen throughout the affected
areas. Additionally, multiple new scattered foci of susceptibility
artifacts seen along the gray-white matter differentiation of the
affected areas as well, suggesting blood products and breakdown of
the blood-brain barrier. Given the widespread nature of these
findings and history of seizure, findings felt to be most consistent
with PRES. Additional note made of abnormal T2/FLAIR signal
intensity with diffusion abnormality involving the mesial left
temporal lobe/hippocampus, which could also be related to PRES
and/or acute seizure (series 5, image 71). No significant regional
mass effect or midline shift.

No other evidence for acute vascular infarct. No frank or discrete
intraparenchymal hematoma or hemorrhagic transformation. No mass
lesion or extra-axial fluid collection. Pituitary gland suprasellar
region within normal limits. Midline structures intact.

Vascular: Major intracranial vascular flow voids are grossly
maintained. Persistent left trigeminal artery again noted.

Skull and upper cervical spine: Craniocervical junction within
normal limits. Bone marrow signal intensity normal. No scalp soft
tissue abnormality.

Sinuses/Orbits: Globes and orbital soft tissues grossly within
normal limits. Paranasal sinuses are clear. Small left greater than
right mastoid effusions. Inner ear structures grossly normal.

Other: None.
IMPRESSION: 1. Interval development of extensive abnormal T2/FLAIR signal
abnormality involving the left greater than right cerebral
hemispheres as well as the cerebellum, with associated patchy
diffusion abnormality and enhancement. Given the history of seizure,
findings felt to be most consistent with PRES.
2. Superimposed abnormal T2/FLAIR signal intensity with diffusion
abnormality involving the mesial left temporal lobe/hippocampus,
which could also be related to PRES and/or acute seizure/status
epilepticus.
3. Underlying chronic bilateral posterior MCA and right PCA/MCA
distribution infarcts.

## 2020-07-29 IMAGING — DX DG ABD PORTABLE 1V
1 series · 1 of 1 positions shown · non-contrast
Comparison: [DATE]

CLINICAL DATA: NG tube placement

EXAM:
PORTABLE ABDOMEN - 1 VIEW

[abdomen kub]
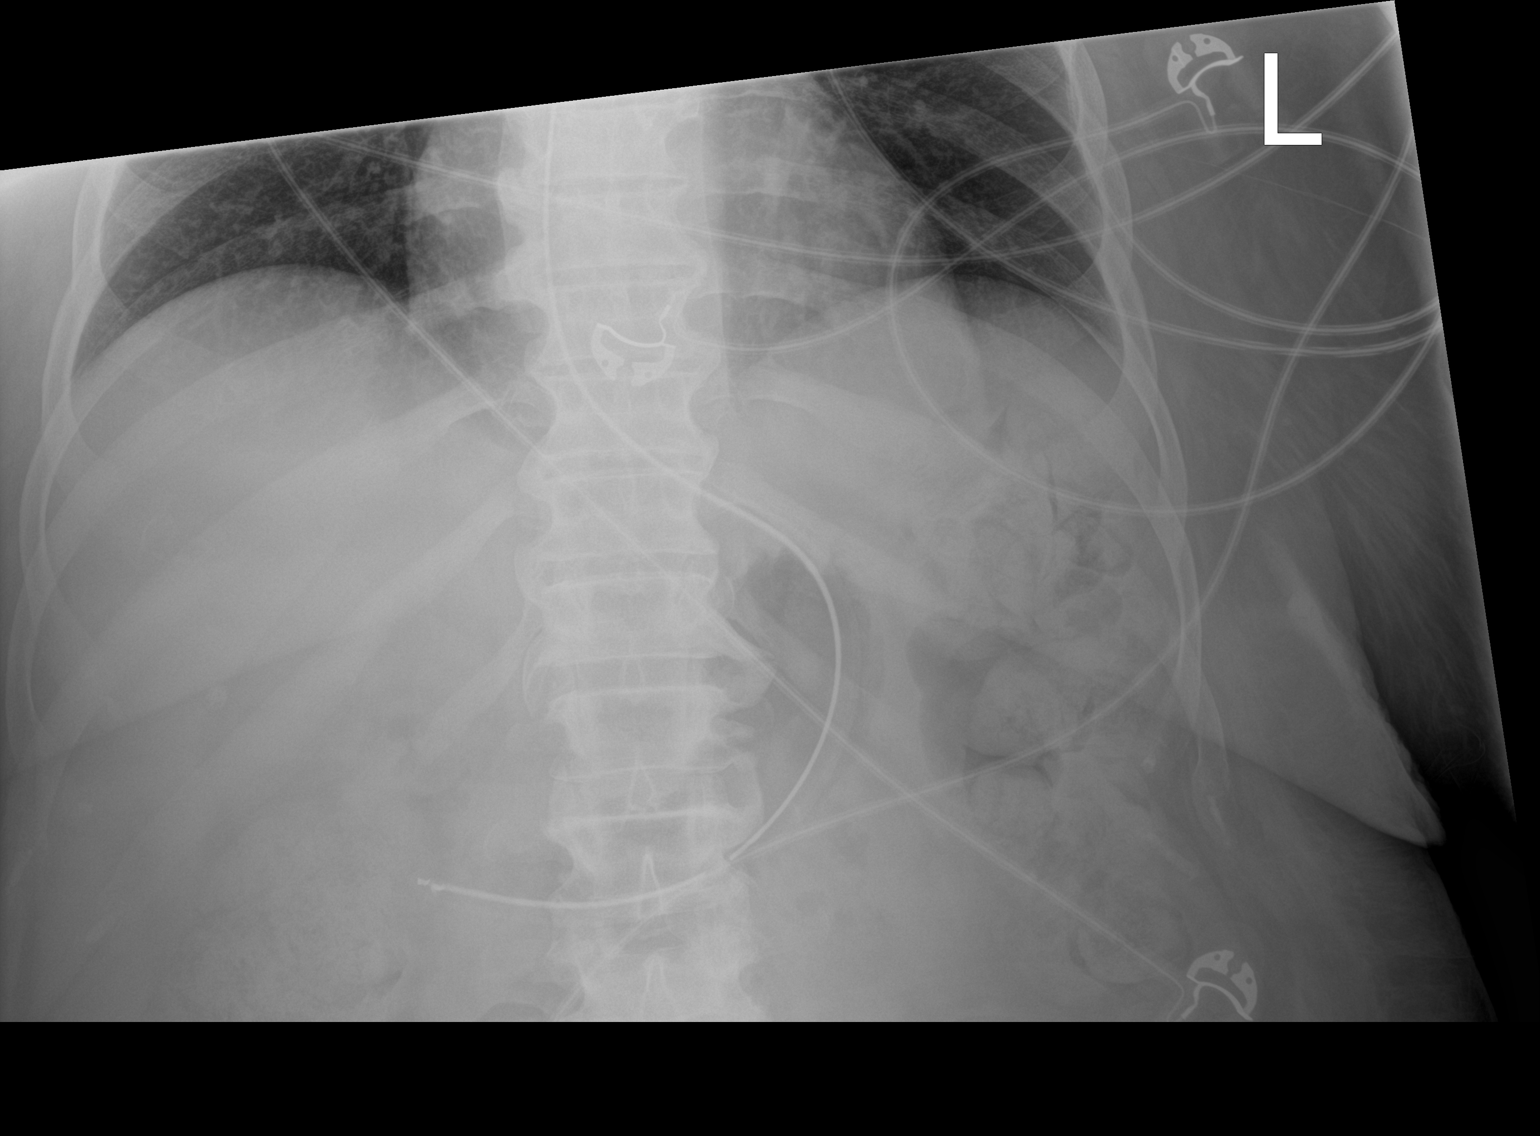

[1 of 1 positions shown; findings below may reference images not displayed]

FINDINGS: Esophageal tube tip overlies the distal stomach. The lung bases are
clear. Upper gas pattern is nonobstructed
IMPRESSION: Esophageal tube tip overlies the distal stomach.

## 2020-07-29 MED ORDER — NICARDIPINE HCL IN NACL 20-0.86 MG/200ML-% IV SOLN
3.0000 mg/h | INTRAVENOUS | Status: DC
  Administered 2020-07-29: 3 mg/h via INTRAVENOUS
  Administered 2020-07-30 (×2): 5 mg/h via INTRAVENOUS
  Filled 2020-07-29 (×3): qty 200

## 2020-07-29 MED ORDER — NITROPRUSSIDE SODIUM-NACL 20-0.9 MG/100ML-% IV SOLN: 0.0000 ug/kg/min | INTRAVENOUS | Status: DC

## 2020-07-29 MED ORDER — NITROPRUSSIDE SODIUM-NACL 20-0.9 MG/100ML-% IV SOLN
0.0000 ug/kg/min | INTRAVENOUS | Status: DC
  Filled 2020-07-29: qty 100

## 2020-07-29 MED ORDER — AMLODIPINE BESYLATE 10 MG PO TABS
10.0000 mg | ORAL_TABLET | Freq: Every day | ORAL | Status: DC
  Administered 2020-07-29: 10 mg via ORAL
  Filled 2020-07-29 (×2): qty 1

## 2020-07-29 MED ORDER — VITAL HIGH PROTEIN PO LIQD
1000.0000 mL | ORAL | Status: DC
  Administered 2020-07-29: 1000 mL
  Filled 2020-07-29: qty 1000

## 2020-07-29 MED ORDER — PHENOBARBITAL SODIUM 65 MG/ML IJ SOLN
65.0000 mg | Freq: Two times a day (BID) | INTRAMUSCULAR | Status: DC
  Administered 2020-07-29 (×2): 65 mg via INTRAVENOUS
  Filled 2020-07-29 (×2): qty 1

## 2020-07-29 MED ORDER — CLONIDINE HCL 0.2 MG PO TABS: 0.3000 mg | ORAL_TABLET | Freq: Every day | ORAL | Status: DC

## 2020-07-29 MED ORDER — CHLORHEXIDINE GLUCONATE CLOTH 2 % EX PADS
6.0000 | MEDICATED_PAD | Freq: Every day | CUTANEOUS | Status: DC
  Administered 2020-07-30 – 2020-08-17 (×16): 6 via TOPICAL

## 2020-07-29 MED ORDER — GADOBUTROL 1 MMOL/ML IV SOLN
9.0000 mL | Freq: Once | INTRAVENOUS | Status: AC | PRN
  Administered 2020-07-29: 9 mL via INTRAVENOUS

## 2020-07-29 MED ORDER — POTASSIUM CHLORIDE 10 MEQ/100ML IV SOLN
10.0000 meq | INTRAVENOUS | Status: AC
  Administered 2020-07-29 – 2020-07-30 (×4): 10 meq via INTRAVENOUS
  Filled 2020-07-29 (×4): qty 100

## 2020-07-29 MED ORDER — PROSOURCE TF PO LIQD
45.0000 mL | Freq: Two times a day (BID) | ORAL | Status: DC
  Administered 2020-07-29 – 2020-07-30 (×2): 45 mL
  Filled 2020-07-29 (×2): qty 45

## 2020-07-29 MED ORDER — AMLODIPINE BESYLATE 10 MG PO TABS
10.0000 mg | ORAL_TABLET | Freq: Every day | ORAL | Status: DC
  Administered 2020-07-30 – 2020-08-14 (×16): 10 mg
  Filled 2020-07-29 (×16): qty 1

## 2020-07-29 NOTE — Progress Notes (Signed)
PROGRESS NOTE  Susan [name] FYB:[id number] DOB: 04-13-1961 DOA: 07/20/2020 PCP: Susan Koch, MD  Brief History   Susan [name] was admitted to the hospital with a working diagnosis of acute metabolic encephalopathywith features of delirium and psychosis, complicated with new onset seizures.  59 year old female past medical history for schizoaffective disorder, bipolar, seizure disorder, type 2 diabetes mellitus, polysubstance abuse, HIV, history of CVA and a recent hospitalization for respiratory failure and encephalopathyMarch to May 2021, that requiredtrach and PEG (now reverted).  Patient was brought to the hospital due to severe agitation, approximately for about 24 hours, apparently patient has stopped taking her psychotropic medications for several weeks. On her initial physical examination blood pressure 157/93, heart rate 112, respiratory rate 32, temperature 98, oxygen saturation 94%, her lungs were clear to auscultation bilaterally, heart S1-S2, present rhythm, soft abdomen, no lower extremity edema. Dry mucous membranes, extreme agitation, moving all 4 extremities spontaneously. Not following commands or answering questions. Sodium 139, potassium 3.8, chloride 106, bicarb 24, glucose 188, BUN 12, creatinine 0.93, AST 12, ALT 10, white count 7.6, hemoglobin 16.1, hematocrit 48.6, platelets 240. Urinalysis negative for infection. SARS COVID-19 negative. Head CT with no acute changes. Multiple chronic infarcts. Chest radiograph no infiltrates. CT chest, abdomen pelvis, negative for dissection. EKG 53 bpm, normal axis, normal intervals, sinus rhythm, positive LVH, no ST segment T wave changes.  Patient was placed on physical restrains and on antipsychotic regimen.  No meningeal signs, no fever or leukocytosis, doubt patient has bacterial meningitis. She has been very agitated and likely will be very difficult to obtain lumbar puncture.  Initially holding on brain  MRI and EEG, due to agitation and poor cooperation.   Unable to take po due to cognitive impairment.  11/20 patient had a seizure with general clonic movements and loss of continence. Received lorazepam IV.  EEG positive for seizures and patient placed on antiepileptic regimen per neurology recommendations.   LTM EEG with continued intermittent seizures, epileptogenicity maximal in the right frontal region.  Neurology has discontinued continuous EEG. It is thought that the patient's recover from seizures is prolonged, because her seizures are complex and prolonged per neurology.  I have discussed the patient in detail with Susan [name] on the morning of 07/29/2020 regarding the patient's blood pressures. Susan [name] is concerned that the patient's seizures are due to her high blood pressures and PRES syndrome. The patient was started on IV hydralazine and lopressor on 07/27/2020, but they have not controlled the patient's blood pressures. I have written orders for the patient to be started on nitroprusside and to transfer to a progressive unit where this can be done.   Consultants  . Neurology  Procedures  . EEG  Antibiotics   Anti-infectives (From admission, onward)   Start     Dose/Rate Route Frequency Ordered Stop   07/21/20 1000  bictegravir-emtricitabine-tenofovir AF (BIKTARVY) 50-200-25 MG per tablet 1 tablet        1 tablet Oral Daily 07/21/20 0058       Subjective  The patient is somnolent, but rousable. Daughter is at bedside. The patient is not verbally communicative.  Objective   Vitals:  Vitals:   07/29/20 0920 07/29/20 1000  BP: (!) 219/95 (!) 172/89  Pulse: 66 64  Resp: 18 19  Temp: (!) 97.4 F (36.3 C) 98 F (36.7 C)  SpO2: 99% 98%   Exam:  Constitutional:  The patient is somnolent, but rousable.  Respiratory:  . No increased work of breathing. [name]  No wheezes, rales, or rhonchi . No tactile fremitus Cardiovascular:  . Regular rate and rhythm . No  murmurs, ectopy, or gallups. . No lateral PMI. No thrills. Abdomen:  . Abdomen is soft, non-tender, non-distended . No hernias, masses, or organomegaly . Normoactive bowel sounds.  Musculoskeletal:  . No cyanosis, clubbing, or edema Skin:  . No rashes, lesions, ulcers . palpation of skin: no induration or nodules Neurologic:  . I am unable to evaluate the patient's neurological status due to the patient's inability to cooperate with exam. Psychiatric: I am unable to evaluate the patient's psychiatric status due to the patient's inability to cooperate with exam.  I have personally reviewed the following:   Today's Data  . Vitals, BMP  Micro Data  . MRSa by PCR is positive.  Imaging  . CTA chest/abdomen  Scheduled Meds: .  stroke: mapping our early stages of recovery book   Does not apply Once  . benztropine  0.5 mg Oral QHS  . bictegravir-emtricitabine-tenofovir AF  1 tablet Oral Daily  . cloNIDine  0.3 mg Transdermal Weekly  . doxepin  50 mg Oral QHS  . enoxaparin (LOVENOX) injection  40 mg Subcutaneous Daily  . hydrOXYzine  50 mg Oral TID  . pantoprazole (PROTONIX) IV  40 mg Intravenous Q24H  . PHENObarbital  65 mg Intravenous BID  . QUEtiapine  200 mg Oral BID  . sertraline  50 mg Oral QHS  . sodium chloride flush  10-40 mL Intracatheter Q12H  . sodium chloride flush  3 mL Intravenous Once   Continuous Infusions: . lacosamide (VIMPAT) IV 150 mg (07/29/20 1039)  . levETIRAcetam 1,500 mg (07/29/20 1022)  . nitroPRUSSide    . valproate sodium 500 mg (07/29/20 1240)    Principal Problem:   Altered mental status Active Problems:   Essential hypertension   Schizoaffective disorder, bipolar type (HCC)   GERD (gastroesophageal reflux disease)   Uncontrolled type 2 diabetes mellitus with hyperglycemia, with long-term current use of insulin (HCC)   Human immunodeficiency virus (HIV) disease ([place])   Metabolic encephalopathy   LOS: 7 days   A & P   Acute metabolic  encephalopathy, with delirium and possible psychosis/NEW onsetseizure (persistent intermittent). Today at the time of my examination she is having repetitive lateral movements of her head, and right foot, eyes deviated to 1:00 o'clock. She is not responsive to pain or voice stimuli, not blinking to visual stimuli. Continuous EEG monitoring has been discontinued by neurology.  It is thought that the patient's recovery from seizures is prolonged, because her seizures are complex and prolonged per neurology.   PRES Syndrome: Concern for this per neurology based on the patient's MRI and her continued high blood pressures despite IV antihypertensives. Susan [name] is concerned that the patient's seizures are due to her high blood pressures and PRES syndrome. The patient was started on IV hydralazine and lopressor on 07/27/2020, but they have not controlled the patient's blood pressures. I have written orders for the patient to be started on nitroprusside and to transfer to a progressive unit where this can be done.   Dysphagia: Pt remains NPO as her mental status leaves unable to swallow safely. I have placed an order for cortrak placement so that tube feeds may be started.  On soft 2 wrist restrains to protect IV lines. Continue neuro checks, aspiration and fall precautions. Unable to get po due to aspiration risk related to encephalopathy. Continue daily IV vitamins including thiamine.   Continue with antiepileptic  regimen with: Lacosamide, Keppra, and Valproic acid, as needed lorazepam (#0 doses).   Uncontrolled Hypertension in the setting of uncontrolled DM II. Currently being managed with as needed IV hydralazine and clonidine patch at 0.3 mg as she is not able to tolerate PO intake. The patient's Hgb A1c 7,7. Continue with insulin sliding scale for glucose cover and monitoring. Patient not able to tolerate po diet due to encephalopathy. Susan [name] is concerned that the patient's seizures are due to  her high blood pressures and PRES syndrome. The patient was started on IV hydralazine and lopressor on 07/27/2020, but they have not controlled the patient's blood pressures. I have written orders for the patient to be started on nitroprusside and to transfer to a progressive unit where this can be done.   HIV: Noted. Unable to ger her antiretroviral medications due to cognitive impairment.   GERD: She is currently receiving IVpantoprazole.   Schizophrenia: Noted. At home on benztropine, buspirone, doxepin, hydroxyzine, quetiapine and sertraline. Plan to restart these as soon as the patient is able to tolerate PO intake safely.  Hypokalemia with anion gap metabolic acidosis: Potassium 3.0 this morning. Supplement. Anion gap is closing at 13.  Resolved. Renal function with serum cr at 0.72. Bicarbonate is improved at 20. Na is 143. Continue with IV fluids isotonic saline, "banana bag" with 40 Kcl, follow up on renal function in am. Will check CK due to concern for rhabdomyolysis due to persistent seizures.  Patient continue to be at high risk for worsening seizures and status epilepticus.   I have seen and examined this patient myself. I have spent 40 minutes in her evaluation and care. More than 50% of this time was spent in coordination of care with neurology.  DVT prophylaxis:      Enoxaparin   Code Status:              Full Code Family Communication:       None available.   Status is: Inpatient  Remains inpatient appropriate because:IV treatments appropriate due to intensity of illness or inability to take PO  Dispo: The patient is from: Home  Anticipated d/c is to: Home  Anticipated d/c date is: > 3 days  Patient currently is not medically stable to d/c.  Joshau Code, DO [facility] Direct contact: see [email or web address]  7PM-7AM contact night coverage as above 07/29/2020, 4:41 PM  LOS: 5 days

## 2020-07-29 NOTE — Progress Notes (Signed)
[facility] Progress Note Patient Name: Susan [name] DOB: 05-18-1961 MRN: [id number]   Date of Service  07/29/2020  HPI/Events of Note  L arm restraint requested as patient is repeatedly reaching for newly placed NG tube.  eICU Interventions  L arm soft restraint ordered.     Intervention Category Minor Interventions: Agitation / anxiety - evaluation and management  Susan [name] 07/29/2020, 9:02 PM

## 2020-07-29 NOTE — Progress Notes (Signed)
[facility] Progress Note Patient Name: Susan [name] DOB: 09-18-1960 MRN: [id number]   Date of Service  07/29/2020  HPI/Events of Note  PICC requested due to very limited vascular access. Renal function is preserved.   eICU Interventions  Order for PICC entered.     Intervention Category Intermediate Interventions: Other:  Charlott Rakes 07/29/2020, 10:26 PM

## 2020-07-29 NOTE — Progress Notes (Signed)
Physical Therapy Treatment Patient Details Name: Susan [name] MRN: [id number] DOB: 16-Feb-1961 Today's Date: 07/29/2020    History of Present Illness 59 year old female with past medical history of schizoaffective disorder/bipolar type, seizure disorder (?),  Diabetes mellitus type 2, HIV, history of polysubstance abuse,  right PCA/MCA stroke 03/2020, notable prolonged hospitalization from 3/10 to 01/09/2020 for sepsis with respiratory failure and encephalopathy status post trach/PEG and removal. Pt admitted to ED On 11/17 for AMS, agitation.    PT Comments    Patient received in bed, lethargic and difficult to rouse today- did not really respond to sternal rub but did respond to localized stim such as pinching the skin of her thigh with grimaces and moans. Needed totalAx2 for rolling and getting to EOB, as well as totalA to maintain upright at EOB due to posterior push/lean. Continued to have head turn to left, able to get eyes midline once otherwise eyes also drifted to left as well. BP at beginning of session in supine 184/83, in sitting EOB 198/90s, increased to 208/109 at EOS when back in supine, RN aware. Found to be soiled, needed totalAx2 for pericare and rolling as well. A&OX0. Left in bed with alarm active and RN attending. Session limited by lethargy.    Follow Up Recommendations  Other (comment) (inpatient psych)     Equipment Recommendations  Other (comment) (TBD)    Recommendations for Other Services       Precautions / Restrictions Precautions Precautions: Other (comment);Fall Precaution Comments: AMS, can be aggressive Restrictions Weight Bearing Restrictions: No    Mobility  Bed Mobility Overal bed mobility: Needs Assistance Bed Mobility: Rolling;Supine to Sit;Sit to Supine Rolling: Total assist;+2 for physical assistance   Supine to sit: Total assist;+2 for physical assistance;+2 for safety/equipment Sit to supine: Total assist;+2 for physical assistance;+2 for  safety/equipment   General bed mobility comments: totalAx2 due to lethargy, unable to follow commands or physically assist in helping with mobility  Transfers                 General transfer comment: not safe to attempt this session  Ambulation/Gait             General Gait Details: unable today   Stairs             Wheelchair Mobility    Modified Rankin (Stroke Patients Only)       Balance Overall balance assessment: Needs assistance Sitting-balance support: Bilateral upper extremity supported;Feet supported Sitting balance-Leahy Scale: Zero Sitting balance - Comments: actively pushing backwards in sitting                                    Cognition Arousal/Alertness: Lethargic Behavior During Therapy: Flat affect Overall Cognitive Status: No family/caregiver present to determine baseline cognitive functioning Area of Impairment: Orientation;Attention;Following commands;Awareness;Problem solving                 Orientation Level: Disoriented to;Person;Place;Time;Situation Current Attention Level: Focused   Following Commands:  (no command following)   Awareness: Intellectual Problem Solving: Slow processing;Decreased initiation;Difficulty sequencing;Requires verbal cues;Requires tactile cues General Comments: not responding to any verbal commands, did make faces and moan with tactile stimulation; no aggression today, just very flat and lethargic      Exercises      General Comments        Pertinent Vitals/Pain Pain Assessment: Faces Faces Pain Scale: Hurts little more Pain Location:  with tactile stimulation Pain Descriptors / Indicators: Grimacing;Moaning Pain Intervention(s): Limited activity within patient's tolerance;Monitored during session;Repositioned    Home Living                      Prior Function            PT Goals (current goals can now be found in the care plan section) Acute Rehab PT  Goals Patient Stated Goal: none stated PT Goal Formulation: Patient unable to participate in goal setting Time For Goal Achievement: 08/04/20 Potential to Achieve Goals: Fair Progress towards PT goals: Not progressing toward goals - comment (limited by lethargy)    Frequency    Min 2X/week      PT Plan Current plan remains appropriate    Co-evaluation PT/OT/SLP Co-Evaluation/Treatment: Yes Reason for Co-Treatment: Complexity of the patient's impairments (multi-system involvement);Necessary to address cognition/behavior during functional activity;For patient/therapist safety          AM-PAC PT "6 Clicks" Mobility   Outcome Measure  Help needed turning from your back to your side while in a flat bed without using bedrails?: Total Help needed moving from lying on your back to sitting on the side of a flat bed without using bedrails?: Total Help needed moving to and from a bed to a chair (including a wheelchair)?: Total Help needed standing up from a chair using your arms (e.g., wheelchair or bedside chair)?: Total Help needed to walk in hospital room?: Total Help needed climbing 3-5 steps with a railing? : Total 6 Click Score: 6    End of Session   Activity Tolerance: Patient limited by lethargy Patient left: in bed;with call bell/phone within reach;with bed alarm set;with nursing/sitter in room Nurse Communication: Mobility status PT Visit Diagnosis: Muscle weakness (generalized) (M62.81);Other abnormalities of gait and mobility (R26.89)     Time: 2197-5883 PT Time Calculation (min) (ACUTE ONLY): 26 min  Charges:  $Therapeutic Activity: 8-22 mins (co-tx wth OT)                     Windell Norfolk, DPT, PN1   Supplemental Physical Therapist [place]    Pager [phone] Acute Rehab Office [phone]

## 2020-07-29 NOTE — Plan of Care (Signed)
  Problem: Education: Goal: Knowledge of General Education information will improve Description: Including pain rating scale, medication(s)/side effects and non-pharmacologic comfort measures 07/29/2020 1738 by Morene Rankins, LPN Outcome: Not Progressing 07/29/2020 1738 by Morene Rankins, LPN Outcome: Not Progressing   Problem: Health Behavior/Discharge Planning: Goal: Ability to manage health-related needs will improve 07/29/2020 1738 by Morene Rankins, LPN Outcome: Not Progressing 07/29/2020 1738 by Morene Rankins, LPN Outcome: Not Progressing   Problem: Clinical Measurements: Goal: Ability to maintain clinical measurements within normal limits will improve 07/29/2020 1738 by Morene Rankins, LPN Outcome: Not Progressing 07/29/2020 1738 by Morene Rankins, LPN Outcome: Not Progressing Goal: Will remain free from infection Outcome: Not Progressing Goal: Diagnostic test results will improve Outcome: Not Progressing Goal: Respiratory complications will improve Outcome: Not Progressing Goal: Cardiovascular complication will be avoided Outcome: Not Progressing   Problem: Activity: Goal: Risk for activity intolerance will decrease Outcome: Not Progressing   Problem: Nutrition: Goal: Adequate nutrition will be maintained 07/29/2020 1738 by Morene Rankins, LPN Outcome: Not Progressing 07/29/2020 1738 by Morene Rankins, LPN Outcome: Not Progressing   Problem: Coping: Goal: Level of anxiety will decrease Outcome: Not Progressing   Problem: Elimination: Goal: Will not experience complications related to bowel motility 07/29/2020 1738 by Morene Rankins, LPN Outcome: Not Progressing 07/29/2020 1738 by Morene Rankins, LPN Outcome: Not Progressing Goal: Will not experience complications related to urinary retention Outcome: Not Progressing   Problem: Pain Managment: Goal: General experience of comfort will improve Outcome: Not Progressing   Problem:  Safety: Goal: Ability to remain free from injury will improve Outcome: Not Progressing   Problem: Skin Integrity: Goal: Risk for impaired skin integrity will decrease Outcome: Not Progressing

## 2020-07-29 NOTE — Progress Notes (Signed)
[facility] Progress Note Patient Name: Susan [name] DOB: [birth year] MRN: [id number]   Date of Service  07/29/2020  HPI/Events of Note  New patient evaluation. Pt has hx of schizoaffective disorder, polysubstance abuse, HIV and prior CVA. Admitted due to worsening agitation and mentation in setting of not taking psychiatric medications. Hospital course w/ alternating agitation and near-comatose state. EEG on 11/21 showed seizure activity in the R frontal region c/w NCSE (but no seizures since EEG on 11/25), while MRI was c/w PRES.  On camera evaluation, the patient appears to be protecting their airway but does not follow any commands or open their eyes. This is consistent with Dr. Rossie Muskrat evaluation earlier in the day.  Patient is hypertensive to 774'J systolic. She is saturating 100% on RA. Easy work of breathing.   RN is preparing to start Cardene drip to reduce BP.   eICU Interventions  - PRES / Encephalopathy: Cardene drip for target BP initially of 160-170 mmHg. - Hx NCSE this admission: Continue AEDs. Mgmt by Neurology who are following. - Monitor respiratory status and airway closely.     Intervention Category Evaluation Type: New Patient Evaluation  Charlott Rakes 07/29/2020, 8:05 PM
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
## 2020-07-29 NOTE — Progress Notes (Signed)
Subjective: Very lethargic today, not opening eyes or follow commands.  No clinical seizures noted per RN.  ROS: Unable to obtain due to poor mental status  Examination  Vital signs in last 24 hours: Temp:  [96.5 F (35.8 C)-98 F (36.7 C)] 98 F (36.7 C) (11/26 1000) Pulse Rate:  [64-87] 64 (11/26 1000) Resp:  [18-19] 19 (11/26 1000) BP: (172-219)/(82-95) 172/89 (11/26 1000) SpO2:  [98 %-100 %] 98 % (11/26 1000)  General: lying in bed,not in apparent distress CVS: pulse-normal rate and rhythm RS: breathing comfortably,CTA B Extremities: normal,warm Neuro: Lethargic, opens eyes to noxious stimuli, does not follow commands, cranial nerves II 12 grossly intact, moving upper extremities spontaneously  Basic Metabolic Panel: Recent Labs  Lab 07/25/20 0440 07/25/20 0440 07/26/20 0355 07/26/20 0355 07/27/20 0155 07/27/20 1110 07/28/20 0520 07/29/20 0220  NA 144  --  142  --  142  --  143 144  K 3.6  --  3.4*  --  3.5  --  3.1* 3.0*  CL 111  --  107  --  111  --  110 110  CO2 17*  --  19*  --  16*  --  20* 18*  GLUCOSE 120*  --  126*  --  114*  --  121* 104*  BUN 24*  --  19  --  17  --  14 13  CREATININE 0.79  --  0.73  --  0.68  --  0.72 0.87  CALCIUM 8.8*   < > 8.8*   < > 8.6*  --  8.8* 8.6*  MG  --   --   --   --   --  1.9  --   --    < > = values in this interval not displayed.    CBC: Recent Labs  Lab 07/23/20 0543 07/24/20 1011 07/25/20 0440 07/28/20 0520  WBC 14.1* 9.6 9.2 9.6  NEUTROABS 9.6* 5.2 4.6 5.6  HGB 17.7* 17.4* 16.7* 16.0*  HCT 52.2* 51.4* 50.5* 46.4*  MCV 84.5 85.4 86.3 84.7  PLT 186 173 151 127*     Coagulation Studies: No results for input(s): LABPROT, INR in the last 72 hours.  Imaging MRI brain with and without contrast 07/29/2010: Interval development of extensive abnormal T2/FLAIR signal abnormality involving the left greater than right cerebral hemispheres as well as the cerebellum, with associated patchy diffusion abnormality  and enhancement. Given the history of seizure, findings felt to be most consistent with PRES. 2. Superimposed abnormal T2/FLAIR signal intensity with diffusion abnormality involving the mesial left temporal lobe/hippocampus,which could also be related to PRES and/or acute seizure/status epilepticus. 3. Underlying chronic bilateral posterior MCA and right PCA/MCA distribution infarcts.  ASSESSMENT AND PLAN: 59 year old female with past medical history of CVA, schizophrenia, type 2 diabetes who presented with staring and speech arrest as well as possible right-sided weakness and neglect. EEG showed generalized periodic epileptiform discharges, maximal right frontal region.   New onset epilepsy Suspected PRES Aphasia Chronic strokes Acute encephalopathy, hypertensive, ictal/postictal Type 2 diabetes Schizophrenia -LTM EEGdid not show any further seizures. -On personal review, MRI appears atypical for PRES, more likely secondary to prolonged seizures.  However systolic blood pressures have been between 180-220 which is definitely contributing to encephalopathy and worsen seizures  Recommendation - Discussed with Dr Benny Lennert, patient has been lethargic to take p.o. medications and blood pressure continues to be high in spite of adjusting IV medications. -Consider starting patient on IV cardene drip with slowly adjusting blood  pressure, goal SBP,140 -Increase phenobarb to 65 mg twice daily,  - Can increase phenobarbital to 130 mg nightly if any further seizures -Continue Keppra 1500 mg twice daily, VPA 500 milligrams every 8 hours, Vimpat 150 mg twice daily -Continue seizure precautions - PRNIV Ativan 2 mg for clinical seizure lasting more than 2 minutes -Management of rest of comorbidities per primary team  CRITICAL CARE Performed by: Lora Havens   Total critical care time: 312minutes  Critical care time was exclusive of separately billable procedures and treating other  patients.  Critical care was necessary to treat or prevent imminent or life-threatening deterioration.  Critical care was time spent personally by me on the following activities: development of treatment plan with patient and/or surrogate as well as nursing, discussions with consultants, evaluation of patient's response to treatment, examination of patient, obtaining history from patient or surrogate, ordering and performing treatments and interventions, ordering and review of laboratory studies, ordering and review of radiographic studies, pulse oximetry and re-evaluation of patient's condition.   Zeb Comfort Ep[facility] For questions after 5pm please refer to AMION to reach the Neurologist on call

## 2020-07-29 NOTE — Progress Notes (Signed)
Occupational Therapy Treatment Patient Details Name: Susan [name] MRN: [id number] DOB: 1961/08/25 Today's Date: 07/29/2020    History of present illness 59 year old female with past medical history of schizoaffective disorder/bipolar type, seizure disorder (?),  Diabetes mellitus type 2, HIV, history of polysubstance abuse,  right PCA/MCA stroke 03/2020, notable prolonged hospitalization from 3/10 to 01/09/2020 for sepsis with respiratory failure and encephalopathy status post trach/PEG and removal. Pt admitted to ED On 11/17 for AMS, agitation.   OT comments  This 59 yo female admitted with above seen with PT today to see if we could get more alertness and mobility from patient today. Pt not responding to sternal rub while supine in bed. Upon sitting pt up on EOB she did initially open her eyes, but did not maintain. She did respond to pain (pinching) with moan and eye opening. Pt did not attempt to hold self up on EOB and did not participate in any of the bed mobility. She will continue to benefit from OT once a week to see if any progress can be achieved--if so then we will change to 2x/week.  Follow Up Recommendations  SNF;Supervision/Assistance - 24 hour    Equipment Recommendations  Other (comment) (TBD next venue)       Precautions / Restrictions Precautions Precautions: Fall Precaution Comments: AMS, can be aggressive (today was drowsy and lethargic) Restrictions Weight Bearing Restrictions: No       Mobility Bed Mobility Overal bed mobility: Needs Assistance Bed Mobility: Rolling;Supine to Sit;Sit to Supine Rolling: Total assist;+2 for physical assistance   Supine to sit: Total assist;+2 for physical assistance;+2 for safety/equipment Sit to supine: Total assist;+2 for physical assistance;+2 for safety/equipment   General bed mobility comments: totalAx2 due to lethargy, unable to follow commands or physically assist in helping with mobility  Transfers                  General transfer comment: not safe to attempt this session    Balance Overall balance assessment: Needs assistance Sitting-balance support: Bilateral upper extremity supported;Feet supported Sitting balance-Leahy Scale: Zero Sitting balance - Comments: actively pushing backwards in sitting                                   ADL either performed or assessed with clinical judgement   ADL                                         General ADL Comments: total A due to cognition and decreased arousal     Vision   Additional Comments: Pt with eyes open when we sat her up on EOB, but did not maintain them open. She did open her eyes a couple more times while seated EOB with eyes and head mostly to left, with on occassion when eyes were at midline          Cognition Arousal/Alertness: Lethargic Behavior During Therapy: Flat affect Overall Cognitive Status: No family/caregiver present to determine baseline cognitive functioning Area of Impairment: Orientation;Attention;Following commands;Awareness;Problem solving                 Orientation Level: Disoriented to;Person;Place;Time;Situation Current Attention Level: Focused   Following Commands:  (no command following)   Awareness: Intellectual Problem Solving: Slow processing;Decreased initiation;Difficulty sequencing;Requires verbal cues;Requires tactile cues General Comments: not responding to any  verbal commands, did make faces and moan with tactile stimulation; no aggression today, just very flat and lethargic                   Pertinent Vitals/ Pain       Pain Assessment: Faces Faces Pain Scale: Hurts little more Pain Location: with tactile stimulation x 4 extremities Pain Descriptors / Indicators: Grimacing;Moaning Pain Intervention(s): Limited activity within patient's tolerance         Frequency  Min 1X/week        Progress Toward Goals  OT Goals(current goals can  now be found in the care plan section)  Progress towards OT goals: Not progressing toward goals - comment (due to lethargy)  Acute Rehab OT Goals Patient Stated Goal: none stated OT Goal Formulation: Patient unable to participate in goal setting  Plan Discharge plan remains appropriate    Co-evaluation    PT/OT/SLP Co-Evaluation/Treatment: Yes Reason for Co-Treatment: Complexity of the patient's impairments (multi-system involvement);Necessary to address cognition/behavior during functional activity;For patient/therapist safety PT goals addressed during session: Mobility/safety with mobility;Balance OT goals addressed during session: Strengthening/ROM      AM-PAC OT "6 Clicks" Daily Activity     Outcome Measure   Help from another person eating meals?: Total Help from another person taking care of personal grooming?: Total Help from another person toileting, which includes using toliet, bedpan, or urinal?: Total Help from another person bathing (including washing, rinsing, drying)?: Total Help from another person to put on and taking off regular upper body clothing?: Total Help from another person to put on and taking off regular lower body clothing?: Total 6 Click Score: 6    End of Session    OT Visit Diagnosis: Other abnormalities of gait and mobility (R26.89);Other symptoms and signs involving cognitive function;Low vision, both eyes (H54.2)   Activity Tolerance Patient limited by lethargy   Patient Left in bed;with call bell/phone within reach;with bed alarm set   Nurse Communication  (RN in room and we all worked together to get pt cleaned up after sitting up on EOB (pt with incontience of bladder))        Time: 6468-0321 OT Time Calculation (min): 25 min  Charges: OT General Charges $OT Visit: 1 Visit OT Treatments $Self Care/Home Management : 8-22 mins  [place], OTR/L Acute [employer] Pager [phone] Office [phone]      Almon Register 07/29/2020, 11:50 AM

## 2020-07-29 NOTE — Plan of Care (Signed)
  Problem: Clinical Measurements: Goal: Will remain free from infection Outcome: Progressing Goal: Respiratory complications will improve Outcome: Progressing Goal: Cardiovascular complication will be avoided Outcome: Progressing   Problem: Nutrition: Goal: Adequate nutrition will be maintained Outcome: Progressing   Problem: Elimination: Goal: Will not experience complications related to urinary retention Outcome: Progressing   Problem: Pain Managment: Goal: General experience of comfort will improve Outcome: Progressing   Problem: Safety: Goal: Ability to remain free from injury will improve Outcome: Progressing   Problem: Skin Integrity: Goal: Risk for impaired skin integrity will decrease Outcome: Progressing

## 2020-07-30 DIAGNOSIS — I6783 Posterior reversible encephalopathy syndrome: Secondary | ICD-10-CM

## 2020-07-30 LAB — GLUCOSE, CAPILLARY
Glucose-Capillary: 141 mg/dL — ABNORMAL HIGH (ref 70–99)
Glucose-Capillary: 181 mg/dL — ABNORMAL HIGH (ref 70–99)
Glucose-Capillary: 189 mg/dL — ABNORMAL HIGH (ref 70–99)
Glucose-Capillary: 158 mg/dL — ABNORMAL HIGH (ref 70–99)
Glucose-Capillary: 184 mg/dL — ABNORMAL HIGH (ref 70–99)
Glucose-Capillary: 234 mg/dL — ABNORMAL HIGH (ref 70–99)

## 2020-07-30 MED ORDER — VALPROIC ACID 250 MG/5ML PO SOLN
500.0000 mg | Freq: Three times a day (TID) | ORAL | Status: DC
  Administered 2020-07-30 – 2020-08-11 (×37): 500 mg
  Filled 2020-07-30 (×39): qty 10

## 2020-07-30 MED ORDER — LACTULOSE 10 GM/15ML PO SOLN: 20.0000 g | Freq: Three times a day (TID) | ORAL | Status: DC

## 2020-07-30 MED ORDER — POLYETHYLENE GLYCOL 3350 17 G PO PACK
17.0000 g | PACK | Freq: Every day | ORAL | Status: DC | PRN
  Administered 2020-07-30: 17 g
  Filled 2020-07-30: qty 1

## 2020-07-30 MED ORDER — MUPIROCIN 2 % EX OINT
TOPICAL_OINTMENT | Freq: Two times a day (BID) | CUTANEOUS | Status: DC
  Administered 2020-08-02 – 2020-08-10 (×3): 1 via NASAL
  Filled 2020-07-30 (×9): qty 22

## 2020-07-30 MED ORDER — BUSPIRONE HCL 15 MG PO TABS
15.0000 mg | ORAL_TABLET | Freq: Three times a day (TID) | ORAL | Status: DC | PRN
  Administered 2020-08-06 – 2020-08-11 (×7): 15 mg
  Filled 2020-07-30 (×7): qty 1

## 2020-07-30 MED ORDER — ACETAMINOPHEN 160 MG/5ML PO SOLN: 650.0000 mg | ORAL | Status: DC | PRN

## 2020-07-30 MED ORDER — CLONIDINE HCL 0.2 MG PO TABS
0.2000 mg | ORAL_TABLET | Freq: Two times a day (BID) | ORAL | Status: DC
  Administered 2020-07-30 – 2020-08-12 (×27): 0.2 mg
  Filled 2020-07-30 (×27): qty 1

## 2020-07-30 MED ORDER — LACTULOSE 10 GM/15ML PO SOLN
20.0000 g | Freq: Three times a day (TID) | ORAL | Status: DC
  Administered 2020-07-30 – 2020-08-12 (×40): 20 g
  Filled 2020-07-30 (×41): qty 30

## 2020-07-30 MED ORDER — ACETAMINOPHEN 650 MG RE SUPP: 650.0000 mg | RECTAL | Status: DC | PRN

## 2020-07-30 MED ORDER — PHENOBARBITAL 20 MG/5ML PO ELIX
64.8000 mg | ORAL_SOLUTION | Freq: Two times a day (BID) | ORAL | Status: DC
  Administered 2020-07-30 – 2020-08-12 (×26): 64.8 mg
  Filled 2020-07-30 (×20): qty 22.5
  Filled 2020-07-30: qty 20
  Filled 2020-07-30 (×8): qty 22.5

## 2020-07-30 MED ORDER — PANTOPRAZOLE SODIUM 40 MG PO PACK
40.0000 mg | PACK | Freq: Every day | ORAL | Status: DC
  Administered 2020-07-30 – 2020-08-11 (×13): 40 mg
  Filled 2020-07-30 (×15): qty 20

## 2020-07-30 MED ORDER — CHLORHEXIDINE GLUCONATE 0.12 % MT SOLN
15.0000 mL | Freq: Two times a day (BID) | OROMUCOSAL | Status: DC
  Administered 2020-07-30 – 2020-08-17 (×34): 15 mL via OROMUCOSAL
  Filled 2020-07-30 (×31): qty 15

## 2020-07-30 MED ORDER — LACTULOSE 10 GM/15ML PO SOLN
20.0000 g | Freq: Three times a day (TID) | ORAL | Status: DC
  Filled 2020-07-30: qty 30

## 2020-07-30 MED ORDER — LACOSAMIDE 50 MG PO TABS: 150.0000 mg | ORAL_TABLET | Freq: Two times a day (BID) | ORAL | Status: DC

## 2020-07-30 MED ORDER — PHENOBARBITAL 32.4 MG PO TABS
64.8000 mg | ORAL_TABLET | Freq: Two times a day (BID) | ORAL | Status: DC
  Administered 2020-07-30: 64.8 mg via ORAL
  Filled 2020-07-30: qty 2
  Filled 2020-07-30: qty 1

## 2020-07-30 MED ORDER — LACOSAMIDE 50 MG PO TABS
150.0000 mg | ORAL_TABLET | Freq: Two times a day (BID) | ORAL | Status: DC
  Administered 2020-07-30 – 2020-08-12 (×27): 150 mg
  Filled 2020-07-30 (×28): qty 3

## 2020-07-30 MED ORDER — HALOPERIDOL 1 MG PO TABS
1.0000 mg | ORAL_TABLET | ORAL | Status: DC | PRN
  Administered 2020-08-06: 1 mg
  Filled 2020-07-30 (×3): qty 1

## 2020-07-30 MED ORDER — ORAL CARE MOUTH RINSE
15.0000 mL | Freq: Two times a day (BID) | OROMUCOSAL | Status: DC
  Administered 2020-07-30 – 2020-08-17 (×29): 15 mL via OROMUCOSAL

## 2020-07-30 MED ORDER — OSMOLITE 1.5 CAL PO LIQD
1000.0000 mL | ORAL | Status: DC
  Administered 2020-07-30 – 2020-08-08 (×5): 1000 mL
  Filled 2020-07-30 (×5): qty 1000

## 2020-07-30 MED ORDER — ACETAMINOPHEN 325 MG PO TABS: 650.0000 mg | ORAL_TABLET | ORAL | Status: DC | PRN

## 2020-07-30 MED ORDER — INSULIN ASPART 100 UNIT/ML ~~LOC~~ SOLN
4.0000 [IU] | SUBCUTANEOUS | Status: DC
  Administered 2020-07-30 – 2020-07-31 (×5): 4 [IU] via SUBCUTANEOUS

## 2020-07-30 MED ORDER — LABETALOL HCL 5 MG/ML IV SOLN
10.0000 mg | Freq: Four times a day (QID) | INTRAVENOUS | Status: DC | PRN
  Administered 2020-07-30 – 2020-07-31 (×2): 10 mg via INTRAVENOUS
  Filled 2020-07-30 (×2): qty 4

## 2020-07-30 MED ORDER — HALOPERIDOL LACTATE 5 MG/ML IJ SOLN
1.0000 mg | INTRAMUSCULAR | Status: DC | PRN
  Administered 2020-08-01 – 2020-08-07 (×2): 1 mg via INTRAMUSCULAR
  Filled 2020-07-30 (×2): qty 1

## 2020-07-30 MED ORDER — LEVETIRACETAM 100 MG/ML PO SOLN
1500.0000 mg | Freq: Two times a day (BID) | ORAL | Status: DC
  Administered 2020-07-30 – 2020-08-12 (×26): 1500 mg
  Filled 2020-07-30 (×28): qty 15

## 2020-07-30 MED ORDER — VALPROIC ACID 250 MG/5ML PO SOLN: 500.0000 mg | Freq: Three times a day (TID) | ORAL | Status: DC

## 2020-07-30 MED ORDER — EMTRICITABINE-TENOFOVIR AF 200-25 MG PO TABS
1.0000 | ORAL_TABLET | Freq: Every day | ORAL | Status: DC
  Administered 2020-07-31 – 2020-08-01 (×2): 1
  Filled 2020-07-30 (×2): qty 1

## 2020-07-30 MED ORDER — PROSOURCE TF PO LIQD
45.0000 mL | Freq: Every day | ORAL | Status: DC
  Administered 2020-07-31 – 2020-08-10 (×11): 45 mL
  Filled 2020-07-30 (×11): qty 45

## 2020-07-30 MED ORDER — DOLUTEGRAVIR SODIUM 50 MG PO TABS
50.0000 mg | ORAL_TABLET | Freq: Every day | ORAL | Status: DC
  Administered 2020-07-31 – 2020-08-01 (×2): 50 mg
  Filled 2020-07-30 (×2): qty 1

## 2020-07-30 MED ORDER — LEVETIRACETAM 750 MG PO TABS: 1500.0000 mg | ORAL_TABLET | Freq: Two times a day (BID) | ORAL | Status: DC

## 2020-07-30 NOTE — Progress Notes (Signed)
Patient with multiple medication orders written via oral route. Patient currently NPO, with NGT in place. Pharmacist aware, orders modified for tube administration, all meds given via tube.

## 2020-07-30 NOTE — Progress Notes (Signed)
Telephone consent obtained from daughter for PICC placement. Secure chat received from RN that PICC order to be cancelled due to meds changing to  Per tube.  Dtr requests for MD to call her cell phone to give updates and also states if PICC ordered again, she wants to give consent now in case unable to reach her.

## 2020-07-30 NOTE — Progress Notes (Signed)
Initial Nutrition Assessment  DOCUMENTATION CODES:   Not applicable  INTERVENTION:   Plan to exchange NG tube out for Cortrak on next scheduled Cortrak day  No BM in at least 9 days (last BM PTA); recommend scheduled bowel regimen. Discussed with MD  Tube Feeding via NG tube: Osmolite 1.5 at 55 ml/hr Pro-Source 45 mL daily Provides 100 g of protein, 2020 kcals and 1003 mL of free water Meets 100% estimated calorie and protein needs   NUTRITION DIAGNOSIS:   Inadequate oral intake related to lethargy/confusion, acute illness as evidenced by NPO status.  GOAL:   Patient will meet greater than or equal to 90% of their needs  MONITOR:   TF tolerance, Labs, Weight trends, Diet advancement  REASON FOR ASSESSMENT:   Consult Enteral/tube feeding initiation and management  ASSESSMENT:   59 yo female admitted with acute encephalopathy secondary to PRES and seizures, pt has not been taking psych meds at home. PMH includes DM, HIV, CVA, schizoaffective disorder, bipolar disorder, hx of trach/PEG which has been removed   11/19 Admitted 11/20 Seizure 11/26 MRI concerning for PRES, NG tube placed, TF protocol ordered  Unable to obtain diet and weight history from patient at this time  NPO since admission, 9 days NG tube inserted yesterday, tip in stomach, Adult TF protocol initiated by MD Vital High Protein infusing at 40 ml/hr; pt tolerating  No BM since admission (9 days); recommend aggressive bowel regimen  Labs: potassium 3.0 (L) Meds: KCL   Diet Order:   Diet Order            Diet NPO time specified  Diet effective now                 EDUCATION NEEDS:   Not appropriate for education at this time  Skin:  Skin Assessment: Reviewed RN Assessment  Last BM:  PTA  Height:   Ht Readings from Last 1 Encounters:  07/21/20 5\' 6"  (1.676 m)    Weight:   Wt Readings from Last 1 Encounters:  07/30/20 87.4 kg    BMI:  Body mass index is 31.1  kg/m.  Estimated Nutritional Needs:   Kcal:  2000-2200 kcals  Protein:  100-110 g  Fluid:  >/= 2L  Kerman Passey MS, RDN, LDN, CNSC Registered Dietitian III Clinical Nutrition RD Pager and On-Call Pager Number Located in [place]

## 2020-07-30 NOTE — Progress Notes (Signed)
Patient with no documentation of bowel movement this admission.  +BSX4, abd soft and non tender. No distention.  CCM aware, miralax order obtained.
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
## 2020-07-30 NOTE — Progress Notes (Signed)
IVT notes: Completed PICC order in error, Called Elink RN to request re order for PICC consult.Request acknowledged.Thank You for IVT consult.

## 2020-07-31 DIAGNOSIS — I6783 Posterior reversible encephalopathy syndrome: Secondary | ICD-10-CM | POA: Diagnosis not present

## 2020-07-31 DIAGNOSIS — Z4659 Encounter for fitting and adjustment of other gastrointestinal appliance and device: Secondary | ICD-10-CM | POA: Diagnosis not present

## 2020-07-31 DIAGNOSIS — F250 Schizoaffective disorder, bipolar type: Secondary | ICD-10-CM | POA: Diagnosis not present

## 2020-07-31 DIAGNOSIS — Z515 Encounter for palliative care: Secondary | ICD-10-CM

## 2020-07-31 DIAGNOSIS — G9341 Metabolic encephalopathy: Secondary | ICD-10-CM | POA: Diagnosis not present

## 2020-07-31 DIAGNOSIS — R410 Disorientation, unspecified: Secondary | ICD-10-CM

## 2020-07-31 DIAGNOSIS — I10 Essential (primary) hypertension: Secondary | ICD-10-CM | POA: Diagnosis not present

## 2020-07-31 LAB — COMPREHENSIVE METABOLIC PANEL
ALT: 19 U/L (ref 0–44)
AST: 23 U/L (ref 15–41)
Albumin: 2.8 g/dL — ABNORMAL LOW (ref 3.5–5.0)
Alkaline Phosphatase: 78 U/L (ref 38–126)
Anion gap: 11 (ref 5–15)
BUN: 11 mg/dL (ref 6–20)
CO2: 27 mmol/L (ref 22–32)
Calcium: 8.4 mg/dL — ABNORMAL LOW (ref 8.9–10.3)
Chloride: 103 mmol/L (ref 98–111)
Creatinine, Ser: 0.62 mg/dL (ref 0.44–1.00)
GFR, Estimated: >60 mL/min (ref >60)
Glucose, Bld: 208 mg/dL — ABNORMAL HIGH (ref 70–99)
Potassium: 3.1 mmol/L — ABNORMAL LOW (ref 3.5–5.1)
Sodium: 141 mmol/L (ref 135–145)
Total Bilirubin: 0.3 mg/dL (ref 0.3–1.2)
Total Protein: 5.3 g/dL — ABNORMAL LOW (ref 6.5–8.1)

## 2020-07-31 LAB — CBC
HCT: 42.1 % (ref 36.0–46.0)
Hemoglobin: 14.2 g/dL (ref 12.0–15.0)
MCH: 28.6 pg (ref 26.0–34.0)
MCHC: 33.7 g/dL (ref 30.0–36.0)
MCV: 84.7 fL (ref 80.0–100.0)
Platelets: 163 10*3/uL (ref 150–400)
RBC: 4.97 MIL/uL (ref 3.87–5.11)
RDW: 14 % (ref 11.5–15.5)
WBC: 9.2 10*3/uL (ref 4.0–10.5)
nRBC: 0 % (ref 0.0–0.2)

## 2020-07-31 LAB — HIV-1 RNA QUANT-NO REFLEX-BLD
HIV 1 RNA Quant: <20 copies/mL
LOG10 HIV-1 RNA: UNDETERMINED log10copy/mL

## 2020-07-31 LAB — GLUCOSE, CAPILLARY
Glucose-Capillary: 235 mg/dL — ABNORMAL HIGH (ref 70–99)
Glucose-Capillary: 151 mg/dL — ABNORMAL HIGH (ref 70–99)
Glucose-Capillary: 164 mg/dL — ABNORMAL HIGH (ref 70–99)
Glucose-Capillary: 77 mg/dL (ref 70–99)
Glucose-Capillary: 87 mg/dL (ref 70–99)

## 2020-07-31 LAB — MAGNESIUM: Magnesium: 1.8 mg/dL (ref 1.7–2.4)

## 2020-07-31 LAB — PHOSPHORUS: Phosphorus: 2.8 mg/dL (ref 2.5–4.6)

## 2020-07-31 MED ORDER — TRIAMTERENE-HCTZ 37.5-25 MG PO TABS
1.0000 | ORAL_TABLET | Freq: Every day | ORAL | Status: DC
  Filled 2020-07-31: qty 1

## 2020-07-31 MED ORDER — INSULIN DETEMIR 100 UNIT/ML ~~LOC~~ SOLN
20.0000 [IU] | Freq: Two times a day (BID) | SUBCUTANEOUS | Status: DC
  Administered 2020-07-31 – 2020-08-01 (×2): 20 [IU] via SUBCUTANEOUS
  Filled 2020-07-31 (×5): qty 0.2

## 2020-07-31 MED ORDER — INSULIN ASPART 100 UNIT/ML ~~LOC~~ SOLN
0.0000 [IU] | SUBCUTANEOUS | Status: DC
  Administered 2020-07-31 (×2): 4 [IU] via SUBCUTANEOUS
  Administered 2020-08-01 (×2): 3 [IU] via SUBCUTANEOUS
  Administered 2020-08-02 – 2020-08-06 (×11): 4 [IU] via SUBCUTANEOUS
  Administered 2020-08-06 (×2): 3 [IU] via SUBCUTANEOUS
  Administered 2020-08-06 – 2020-08-07 (×2): 4 [IU] via SUBCUTANEOUS
  Administered 2020-08-07 (×2): 3 [IU] via SUBCUTANEOUS
  Administered 2020-08-08 (×2): 4 [IU] via SUBCUTANEOUS
  Administered 2020-08-08: 3 [IU] via SUBCUTANEOUS
  Administered 2020-08-08 – 2020-08-09 (×2): 4 [IU] via SUBCUTANEOUS
  Administered 2020-08-09 (×2): 3 [IU] via SUBCUTANEOUS
  Administered 2020-08-09: 4 [IU] via SUBCUTANEOUS
  Administered 2020-08-09: 3 [IU] via SUBCUTANEOUS
  Administered 2020-08-10: 4 [IU] via SUBCUTANEOUS
  Administered 2020-08-10 – 2020-08-11 (×5): 3 [IU] via SUBCUTANEOUS
  Administered 2020-08-11: 7 [IU] via SUBCUTANEOUS
  Administered 2020-08-11 – 2020-08-12 (×2): 4 [IU] via SUBCUTANEOUS
  Administered 2020-08-12: 7 [IU] via SUBCUTANEOUS
  Administered 2020-08-12 – 2020-08-13 (×2): 3 [IU] via SUBCUTANEOUS
  Administered 2020-08-13: 21:00:00 4 [IU] via SUBCUTANEOUS
  Administered 2020-08-13 – 2020-08-14 (×2): 3 [IU] via SUBCUTANEOUS
  Administered 2020-08-14 – 2020-08-15 (×2): 4 [IU] via SUBCUTANEOUS

## 2020-07-31 MED ORDER — MAGNESIUM SULFATE 2 GM/50ML IV SOLN
2.0000 g | Freq: Once | INTRAVENOUS | Status: AC
  Administered 2020-07-31: 2 g via INTRAVENOUS
  Filled 2020-07-31: qty 50

## 2020-07-31 MED ORDER — INSULIN ASPART 100 UNIT/ML ~~LOC~~ SOLN
4.0000 [IU] | SUBCUTANEOUS | Status: DC
  Administered 2020-07-31 – 2020-08-05 (×26): 4 [IU] via SUBCUTANEOUS

## 2020-07-31 MED ORDER — POTASSIUM CHLORIDE 20 MEQ/15ML (10%) PO SOLN
20.0000 meq | ORAL | Status: AC
  Administered 2020-07-31 (×2): 20 meq
  Filled 2020-07-31 (×2): qty 15

## 2020-07-31 MED ORDER — TRIAMTERENE-HCTZ 37.5-25 MG PO TABS
1.0000 | ORAL_TABLET | Freq: Every day | ORAL | Status: DC
  Administered 2020-07-31: 1
  Filled 2020-07-31 (×2): qty 1

## 2020-07-31 MED ORDER — POTASSIUM CHLORIDE 10 MEQ/100ML IV SOLN
10.0000 meq | INTRAVENOUS | Status: AC
  Administered 2020-07-31 (×4): 10 meq via INTRAVENOUS
  Filled 2020-07-31 (×4): qty 100

## 2020-07-31 MED ORDER — POTASSIUM CHLORIDE 20 MEQ/15ML (10%) PO SOLN
40.0000 meq | Freq: Two times a day (BID) | ORAL | Status: DC
  Administered 2020-07-31: 40 meq
  Filled 2020-07-31 (×2): qty 30
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
## 2020-07-31 NOTE — Progress Notes (Signed)
K+ 3.1, Mg 1.8 Replaced per protocol

## 2020-07-31 NOTE — Progress Notes (Signed)
[facility] PROGRESS NOTE    Progress Note  Susan [name]  QHU:[id number] DOB: 02-May-1961 DOA: 07/20/2020 PCP: Hoyt Koch, MD     Brief Narrative:   Susan [name] is an 59 y.o. female with extensive past medical history schizoaffective disorder/bipolar,  seizures, essential hypertension, diabetes mellitus type 2, HIV polysubstance abuse a history of stroke in August 2021 notable for prolonged hospitalization developed sepsis with respiratory failure during this time she was trach and PEG which is now removed comes into the hospital for encephalopathy on 07/29/2020 concern for press started on IV antihypertensive infusion  Assessment/Plan:   Acute metabolic encephalopathy secondary to stroke/essential hypertension: MRI of the brain was done that compatible with press, she was started on IV Cardene. NG tube was placed when she was able to take orals she was started on her home medication Norvasc, Coreg and clonidine Tapering off Cardene. Restart her Maxide blood pressure seems to be at goal we will try to titrate off Cardene. Discontinue Cardene if blood pressure less than 120. Continue soft restraints.  New onset epilepsy/chronic stroke: Continuous EEG showed status epilepticus. She is currently on phenobarbital Keppra Vimpat and Depakote. Neurology on board no seizure events. Continue seizure precautions.  Dysphagia: Patient failed swallowing evaluation. Currently with NG tube, continue tube feedings per nutrition.  History of schizophrenia/schizoaffective disorder/bipolar: In house she is on BuSpar, at home she is also on Cogentin doxepin gabapentin and Vistaril as well as Seroquel and Zoloft we will restart this as she starts to wake up. These are a lot of medication will have to consult psychiatry when she is more awake to reevaluate and recommend regimen.   HIV/AIDS: Continue Hart therapy. Repeated CD4 count outpatient.  Uncontrolled diabetes mellitus  type 2: With an A1c of 7.7.  He is on sliding scale tube feedings were started and her blood glucose trending up we will start on long-acting insulin. Hypokalemia: Replete orally recheck in the morning.  DVT prophylaxis: lovenxo Family Communication:none Status is: Inpatient  Remains inpatient appropriate because:Hemodynamically unstable   Dispo: The patient is from: SNF              Anticipated d/c is to: SNF              Anticipated d/c date is: > 3 days              Patient currently is not medically stable to d/c.        Code Status:     Code Status Orders  (From admission, onward)         Start     Ordered   07/21/20 0059  Full code  Continuous        07/21/20 0058        Code Status History    Date Active Date Inactive Code Status Order ID Comments User Context   04/02/2020 0121 04/03/2020 2135 Full Code [id number]  Donnamae Jude, MD ED   04/02/2020 0100 04/02/2020 0121 DNR [id number]  Donnamae Jude, MD ED   03/08/2020 2006 03/22/2020 0032 Full Code [id number]  Arnell Asal, NP ED   03/08/2020 1453 03/08/2020 2006 Full Code [id number]  Lequita Halt, MD ED   11/11/2019 1638 01/10/2020 0023 Full Code [id number]  Erick Colace, NP ED   02/16/2019 2156 02/19/2019 1802 Full Code [id number]  Shela Leff, MD ED   02/16/2019 2156 02/16/2019 2156 Full Code [id number]  Shela Leff, MD ED   12/02/2018 [phone] 12/02/2018  [place] Full Code [id number]  Shanon Rosser, MD ED   01/23/2017 1618 01/29/2017 1745 Full Code [id number]  Kerrie Buffalo, NP Inpatient   01/15/2016 2001 01/17/2016 1815 Full Code [id number]  Sidney Ace ED   05/11/2015 1122 05/17/2015 1728 Full Code [id number]  Ursula Alert, MD Inpatient   05/10/2015 1759 05/11/2015 0543 Full Code [id number]  Deno Etienne, DO ED   04/17/2015 1954 04/18/2015 1638 Full Code [id number]  Delfina Redwood, MD Inpatient   01/30/2015 2119 02/08/2015 1621 Full Code [id number]  Harriet Butte, NP Inpatient   01/28/2015 1834 01/30/2015 1942 Full  Code [id number]  Donne Hazel, MD ED   05/01/2014 1922 05/02/2014 2005 Full Code [id number]  Mirna Mires, MD ED   01/25/2013 2035 01/26/2013 0537 Full Code [id number]  Clayton Bibles, PA-C ED   06/01/2012 0030 06/01/2012 2239 Full Code [id number]  Monico Blitz ED   Advance Care Planning Activity        IV Access:    Peripheral IV   Procedures and diagnostic studies:   DG Abd Portable 1V  Result Date: 07/29/2020 CLINICAL DATA:  NG tube placement EXAM: PORTABLE ABDOMEN - 1 VIEW COMPARISON:  11/24/2019 FINDINGS: Esophageal tube tip overlies the distal stomach. The lung bases are clear. Upper gas pattern is nonobstructed IMPRESSION: Esophageal tube tip overlies the distal stomach. Electronically Signed   By: Donavan Foil M.D.   On: 07/29/2020 20:30   [place] EKG SITE RITE  Result Date: 07/29/2020 If Site Rite image not attached, placement could not be confirmed due to current cardiac rhythm.  [place] EKG SITE RITE  Result Date: 07/29/2020 If Site Rite image not attached, placement could not be confirmed due to current cardiac rhythm.    Medical Consultants:    None.  Anti-Infectives:   none  Subjective:    Susan [name] nonverbal this morning.  Objective:    Vitals:   07/31/20 0100 07/31/20 0400 07/31/20 0500 07/31/20 0600  BP: (!) 160/78 135/72 (!) 137/52 (!) 138/116  Pulse: 67 69 67 70  Resp: 20 14 19 12   Temp:  98.2 F (36.8 C)    TempSrc:  Oral    SpO2: 100% 97% 98% 100%  Weight:      Height:       SpO2: 100 %   Intake/Output Summary (Last 24 hours) at 07/31/2020 0716 Last data filed at 07/30/2020 1900 Gross per 24 hour  Intake 735.05 ml  Output 650 ml  Net 85.05 ml   Filed Weights   07/21/20 1934 07/28/20 0543 07/30/20 0500  Weight: 81 kg 86.2 kg 87.4 kg    Exam: General exam: In no acute distress. Respiratory system: Good air movement and clear to auscultation. Cardiovascular system: S1 & S2 heard, RRR. No JVD,. Gastrointestinal  system: Abdomen is nondistended, soft and nontender.  Central nervous system: Sleepy only responsive to an outside stimuli Extremities: No pedal edema. Skin: No rashes, lesions or ulcers   Data Reviewed:    Labs: Basic Metabolic Panel: Recent Labs  Lab 07/26/20 0355 07/26/20 0355 07/27/20 0155 07/27/20 0155 07/27/20 1110 07/28/20 0520 07/28/20 0520 07/29/20 0220 07/29/20 2058 07/31/20 0115  NA 142  --  142  --   --  143  --  144  --  141  K 3.4*   < > 3.5   < >  --  3.1*   < > 3.0*  --  3.1*  CL 107  --  111  --   --  110  --  110  --  103  CO2 19*  --  16*  --   --  20*  --  18*  --  27  GLUCOSE 126*  --  114*  --   --  121*  --  104*  --  208*  BUN 19  --  17  --   --  14  --  13  --  11  CREATININE 0.73  --  0.68  --   --  0.72  --  0.87  --  0.62  CALCIUM 8.8*  --  8.6*  --   --  8.8*  --  8.6*  --  8.4*  MG  --   --   --   --  1.9  --   --   --  1.8 1.8  PHOS  --   --   --   --   --   --   --   --  3.4 2.8   < > = values in this interval not displayed.   GFR Estimated Creatinine Clearance: 85.3 mL/min (by C-G formula based on SCr of 0.62 mg/dL). Liver Function Tests: Recent Labs  Lab 07/26/20 0355 07/28/20 0520 07/31/20 0115  AST 24 22 23   ALT 18 19 19   ALKPHOS 94 74 78  BILITOT 1.9* 1.6* 0.3  PROT 6.3* 5.8* 5.3*  ALBUMIN 3.2* 3.1* 2.8*   No results for input(s): LIPASE, AMYLASE in the last 168 hours. No results for input(s): AMMONIA in the last 168 hours. Coagulation profile No results for input(s): INR, PROTIME in the last 168 hours. COVID-19 Labs  No results for input(s): DDIMER, FERRITIN, LDH, CRP in the last 72 hours.  Lab Results  Component Value Date   SARSCOV2NAA NEGATIVE 07/20/2020   [place] NEGATIVE 04/02/2020   [place] NEGATIVE 03/08/2020   [place] NEGATIVE 01/08/2020    CBC: Recent Labs  Lab 07/24/20 1011 07/25/20 0440 07/28/20 0520 07/31/20 0115  WBC 9.6 9.2 9.6 9.2  NEUTROABS 5.2 4.6 5.6  --   HGB 17.4* 16.7*  16.0* 14.2  HCT 51.4* 50.5* 46.4* 42.1  MCV 85.4 86.3 84.7 84.7  PLT 173 151 127* 163   Cardiac Enzymes: No results for input(s): CKTOTAL, CKMB, CKMBINDEX, TROPONINI in the last 168 hours. BNP (last 3 results) No results for input(s): PROBNP in the last 8760 hours. CBG: Recent Labs  Lab 07/30/20 1236 07/30/20 1643 07/30/20 1923 07/30/20 2313 07/31/20 0316  GLUCAP 189* 158* 184* 234* 235*   D-Dimer: No results for input(s): DDIMER in the last 72 hours. Hgb A1c: No results for input(s): HGBA1C in the last 72 hours. Lipid Profile: No results for input(s): CHOL, HDL, LDLCALC, TRIG, CHOLHDL, LDLDIRECT in the last 72 hours. Thyroid function studies: No results for input(s): TSH, T4TOTAL, T3FREE, THYROIDAB in the last 72 hours.  Invalid input(s): FREET3 Anemia work up: No results for input(s): VITAMINB12, FOLATE, FERRITIN, TIBC, IRON, RETICCTPCT in the last 72 hours. Sepsis Labs: Recent Labs  Lab 07/24/20 1011 07/25/20 0440 07/28/20 0520 07/31/20 0115  WBC 9.6 9.2 9.6 9.2   Microbiology No results found for this or any previous visit (from the past 240 hour(s)).   Medications:   .  stroke: mapping our early stages of recovery book   Does not apply Once  . amLODipine  10 mg Per Tube Daily  . chlorhexidine  15 mL Mouth Rinse BID  . Chlorhexidine Gluconate Cloth  6 each Topical Daily  . cloNIDine  0.2 mg Per Tube BID  . dolutegravir  50 mg Per Tube Daily   And  . emtricitabine-tenofovir AF  1 tablet Per Tube Daily  . enoxaparin (LOVENOX) injection  40 mg Subcutaneous Daily  . feeding supplement (PROSource TF)  45 mL Per Tube Daily  . insulin aspart  4 Units Subcutaneous Q4H  . lacosamide  150 mg Per Tube BID  . lactulose  20 g Per Tube TID  . levETIRAcetam  1,500 mg Per Tube BID  . mouth rinse  15 mL Mouth Rinse q12n4p  . mupirocin ointment   Nasal BID  . pantoprazole sodium  40 mg Per Tube QHS  . PHENObarbital  64.8 mg Per Tube BID  . potassium chloride  20 mEq  Per Tube Q4H  . sodium chloride flush  10-40 mL Intracatheter Q12H  . sodium chloride flush  3 mL Intravenous Once  . valproic acid  500 mg Per Tube Q8H   Continuous Infusions: . feeding supplement (OSMOLITE 1.5 CAL) 1,000 mL (07/30/20 1653)  . niCARDipine Stopped (07/30/20 1030)  . potassium chloride 10 mEq (07/31/20 0604)      LOS: 9 days   Charlynne Cousins  [facility]  07/31/2020, 7:16 AM

## 2020-08-01 DIAGNOSIS — Z4659 Encounter for fitting and adjustment of other gastrointestinal appliance and device: Secondary | ICD-10-CM | POA: Diagnosis not present

## 2020-08-01 DIAGNOSIS — R4182 Altered mental status, unspecified: Secondary | ICD-10-CM | POA: Diagnosis not present

## 2020-08-01 DIAGNOSIS — I6783 Posterior reversible encephalopathy syndrome: Secondary | ICD-10-CM | POA: Diagnosis not present

## 2020-08-01 DIAGNOSIS — G9341 Metabolic encephalopathy: Secondary | ICD-10-CM | POA: Diagnosis not present

## 2020-08-01 DIAGNOSIS — I10 Essential (primary) hypertension: Secondary | ICD-10-CM | POA: Diagnosis not present

## 2020-08-01 LAB — BASIC METABOLIC PANEL
Anion gap: 9 (ref 5–15)
BUN: 9 mg/dL (ref 6–20)
CO2: 29 mmol/L (ref 22–32)
Calcium: 8.7 mg/dL — ABNORMAL LOW (ref 8.9–10.3)
Chloride: 102 mmol/L (ref 98–111)
Creatinine, Ser: 0.65 mg/dL (ref 0.44–1.00)
GFR, Estimated: >60 mL/min (ref >60)
Glucose, Bld: 117 mg/dL — ABNORMAL HIGH (ref 70–99)
Potassium: 4.9 mmol/L (ref 3.5–5.1)
Sodium: 140 mmol/L (ref 135–145)

## 2020-08-01 LAB — COMPREHENSIVE METABOLIC PANEL
ALT: 21 U/L (ref 0–44)
AST: 26 U/L (ref 15–41)
Albumin: 2.8 g/dL — ABNORMAL LOW (ref 3.5–5.0)
Alkaline Phosphatase: 77 U/L (ref 38–126)
Anion gap: 9 (ref 5–15)
BUN: 8 mg/dL (ref 6–20)
CO2: 26 mmol/L (ref 22–32)
Calcium: 8.7 mg/dL — ABNORMAL LOW (ref 8.9–10.3)
Chloride: 104 mmol/L (ref 98–111)
Creatinine, Ser: 0.68 mg/dL (ref 0.44–1.00)
GFR, Estimated: >60 mL/min (ref >60)
Glucose, Bld: 156 mg/dL — ABNORMAL HIGH (ref 70–99)
Potassium: 6.5 mmol/L (ref 3.5–5.1)
Sodium: 139 mmol/L (ref 135–145)
Total Bilirubin: 0.3 mg/dL (ref 0.3–1.2)
Total Protein: 5.4 g/dL — ABNORMAL LOW (ref 6.5–8.1)

## 2020-08-01 LAB — GLUCOSE, CAPILLARY
Glucose-Capillary: 104 mg/dL — ABNORMAL HIGH (ref 70–99)
Glucose-Capillary: 109 mg/dL — ABNORMAL HIGH (ref 70–99)
Glucose-Capillary: 120 mg/dL — ABNORMAL HIGH (ref 70–99)
Glucose-Capillary: 140 mg/dL — ABNORMAL HIGH (ref 70–99)
Glucose-Capillary: 144 mg/dL — ABNORMAL HIGH (ref 70–99)
Glucose-Capillary: 82 mg/dL (ref 70–99)

## 2020-08-01 LAB — PHENOBARBITAL LEVEL: Phenobarbital: 18.5 ug/mL (ref 15.0–30.0)

## 2020-08-01 LAB — VALPROIC ACID LEVEL: Valproic Acid Lvl: 37 ug/mL — ABNORMAL LOW (ref 50.0–100.0)

## 2020-08-01 LAB — T-HELPER CELLS (CD4) COUNT (NOT AT ARMC)
CD4 % Helper T Cell: 27 % — ABNORMAL LOW (ref 33–65)
CD4 T Cell Abs: 915 /uL (ref 400–1790)

## 2020-08-01 MED ORDER — BICTEGRAVIR-EMTRICITAB-TENOFOV 50-200-25 MG PO TABS
1.0000 | ORAL_TABLET | Freq: Every day | ORAL | Status: DC
  Administered 2020-08-02 – 2020-08-07 (×6): 1 via ORAL
  Filled 2020-08-01 (×7): qty 1

## 2020-08-01 MED ORDER — BENZTROPINE MESYLATE 0.5 MG PO TABS
0.5000 mg | ORAL_TABLET | Freq: Every day | ORAL | Status: DC
  Administered 2020-08-01: 0.5 mg via ORAL
  Filled 2020-08-01 (×2): qty 1

## 2020-08-01 MED ORDER — SODIUM POLYSTYRENE SULFONATE 15 GM/60ML PO SUSP
15.0000 g | Freq: Once | ORAL | Status: AC
  Administered 2020-08-01: 15 g
  Filled 2020-08-01: qty 60

## 2020-08-01 MED ORDER — HYDROCHLOROTHIAZIDE 12.5 MG PO CAPS
25.0000 mg | ORAL_CAPSULE | Freq: Every day | ORAL | Status: DC
  Administered 2020-08-01: 25 mg via ORAL
  Filled 2020-08-01 (×2): qty 2

## 2020-08-01 MED ORDER — INSULIN DETEMIR 100 UNIT/ML ~~LOC~~ SOLN
18.0000 [IU] | Freq: Two times a day (BID) | SUBCUTANEOUS | Status: DC
  Administered 2020-08-01 – 2020-08-05 (×8): 18 [IU] via SUBCUTANEOUS
  Filled 2020-08-01 (×9): qty 0.18

## 2020-08-01 MED ORDER — BUSPIRONE HCL 15 MG PO TABS: 15.0000 mg | ORAL_TABLET | Freq: Three times a day (TID) | ORAL | Status: DC | PRN

## 2020-08-01 MED ORDER — QUETIAPINE FUMARATE 50 MG PO TABS
200.0000 mg | ORAL_TABLET | Freq: Two times a day (BID) | ORAL | Status: DC
  Administered 2020-08-01 – 2020-08-04 (×7): 200 mg via ORAL
  Filled 2020-08-01 (×7): qty 4

## 2020-08-01 MED ORDER — AMLODIPINE BESYLATE 10 MG PO TABS: 10.0000 mg | ORAL_TABLET | Freq: Every day | ORAL | Status: DC

## 2020-08-01 MED ORDER — POTASSIUM CHLORIDE 20 MEQ PO PACK
40.0000 meq | PACK | Freq: Once | ORAL | Status: AC
  Administered 2020-08-01: 40 meq

## 2020-08-01 MED ORDER — SERTRALINE HCL 50 MG PO TABS
50.0000 mg | ORAL_TABLET | Freq: Every day | ORAL | Status: DC
  Administered 2020-08-01 – 2020-08-03 (×3): 50 mg via ORAL
  Filled 2020-08-01 (×3): qty 1

## 2020-08-01 NOTE — Progress Notes (Signed)
Subjective: No clinical seizures overnight.  ROS: Unable to obtain due to poor mental status  Examination  Vital signs in last 24 hours: Temp:  [97.2 F (36.2 C)-98.7 F (37.1 C)] 98.3 F (36.8 C) (11/29 1009) Pulse Rate:  [71-83] 83 (11/29 1009) Resp:  [11-25] 17 (11/29 1009) BP: (106-146)/(48-82) 145/81 (11/29 1009) SpO2:  [97 %-100 %] 99 % (11/29 1009)  General: lying in bed,not in apparent distress CVS: pulse-normal rate and rhythm RS: breathing comfortably,CTA B Extremities: normal,warm Neuro:  Opens eyes to tactile stimuli, does not follow commands, smiles and states yes, perseverates, cranial nerves 2- 12 grossly intact, moving all extremities spontaneously  Basic Metabolic Panel: Recent Labs  Lab 07/27/20 0155 07/27/20 0155 07/27/20 1110 07/28/20 0520 07/28/20 0520 07/29/20 0220 07/29/20 2058 07/31/20 0115 08/01/20 0207  NA 142  --   --  143  --  144  --  141 139  K 3.5  --   --  3.1*  --  3.0*  --  3.1* 6.5*  CL 111  --   --  110  --  110  --  103 104  CO2 16*  --   --  20*  --  18*  --  27 26  GLUCOSE 114*  --   --  121*  --  104*  --  208* 156*  BUN 17  --   --  14  --  13  --  11 8  CREATININE 0.68  --   --  0.72  --  0.87  --  0.62 0.68  CALCIUM 8.6*   < >  --  8.8*   < > 8.6*  --  8.4* 8.7*  MG  --   --  1.9  --   --   --  1.8 1.8  --   PHOS  --   --   --   --   --   --  3.4 2.8  --    < > = values in this interval not displayed.    CBC: Recent Labs  Lab 07/28/20 0520 07/31/20 0115  WBC 9.6 9.2  NEUTROABS 5.6  --   HGB 16.0* 14.2  HCT 46.4* 42.1  MCV 84.7 84.7  PLT 127* 163     Coagulation Studies: No results for input(s): LABPROT, INR in the last 72 hours.  Imaging No new brain imaging overnight.  ASSESSMENT AND PLAN: 59 year old female with past medical history of CVA, schizophrenia, type 2 diabetes who presented with staring and speech arrest as well as possible right-sided weakness and neglect. EEG showed generalized periodic  epileptiform discharges, maximal right frontal region.   New onset epilepsy Suspected PRES Aphasia Chronic strokes Acute encephalopathy, hypertensive, ictal/postictal -Patient continues to be encephalopathic, suspected multifactorial due to hypertensive encephalopathy, ictal/postictal state in the setting of poor neurological reserve.  Recommendation -Continue Keppra 1500 mg twice daily, VPA 500 milligrams every 8 hours, Vimpat 150 mg twice daily, phenobarb 65 mg twice daily -We will check valproic acid and phenobarb level, adjust if needed -Can increasevalproic acid if any further seizures -Continue seizure precautions - PRNIV Ativan 2 mg for clinical seizure lasting more than 2 minutes -Management of rest of comorbidities per primary team  I have spent a total of  35 minutes with the patient reviewing hospital notes,  test results, labs and examining the patient as well as establishing an assessment and plan.  > 50% of time was spent in direct patient care.  Zeb Comfort Epilepsy [facility] For questions after 5pm please refer to AMION to reach the Neurologist on call
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
## 2020-08-01 NOTE — Progress Notes (Signed)
Potassium 6.5, Md notified through page.

## 2020-08-01 NOTE — Progress Notes (Addendum)
[facility] PROGRESS NOTE    Progress Note  Susan [name]  GQQ:[id number] DOB: October 12, 1960 DOA: 07/20/2020 PCP: Hoyt Koch, MD     Brief Narrative:   Susan [name] is an 59 y.o. female with extensive past medical history schizoaffective disorder/bipolar,  seizures, essential hypertension, diabetes mellitus type 2, HIV polysubstance abuse a history of stroke in August 2021 notable for prolonged hospitalization developed sepsis with respiratory failure during this time she was trach and PEG which is now removed comes into the hospital for encephalopathy on 07/29/2020 concern for press started on IV antihypertensive infusion  Assessment/Plan:   Acute metabolic encephalopathy secondary to stroke/essential hypertension: MRI of the brain was done that compatible with PRES, she was started on IV Cardene. NG tube was placed when she was able to take orals she was started on her home medication Norvasc, Coreg and clonidine Discontinue Maxide we will start her on hydrochlorothiazide.  New onset epilepsy/chronic stroke: Continuous EEG showed status epilepticus. She is currently on phenobarbital Keppra Vimpat and Depakote. Neurology on board no seizure events. Continue seizure precautions. We consulted palliative care to discussed with family goals of care family, no family wants to be full code and aggressive care.  Dysphagia: Patient failed swallowing evaluation. Consult speech to perform SLP Currently with NG tube, continue tube feedings per nutrition.  History of schizophrenia/schizoaffective disorder/bipolar: In house she is on BuSpar, at home she is also on Cogentin doxepin gabapentin and Vistaril as well as Seroquel and Zoloft we will restart this as she starts to wake up. She is more awake today, will go ahead and start her Seroquel and her Cogentin, will consult psych to adjust her medications as she is on multiple medications.  There is a complex medication regimen.   She is in the room screaming and crying not able to communicate.  HIV/AIDS: Continue Hart therapy. Repeated CD4 count outpatient.  Uncontrolled diabetes mellitus type 2: With an A1c of 7.7.  Decrease her long-acting insulin continue sliding scale.  Hyperkalemia: High today stop supplementation stop triamterene continue hydrochlorothiazide she was given Kayexalate recheck a basic metabolic panel this afternoon and tomorrow morning.  DVT prophylaxis: lovenxo Family Communication:none Status is: Inpatient  Remains inpatient appropriate because:Hemodynamically unstable   Dispo: The patient is from: SNF              Anticipated d/c is to: SNF              Anticipated d/c date is: > 3 days              Patient currently is not medically stable to d/c.        Code Status:     Code Status Orders  (From admission, onward)         Start     Ordered   07/21/20 0059  Full code  Continuous        07/21/20 0058        Code Status History    Date Active Date Inactive Code Status Order ID Comments User Context   04/02/2020 0121 04/03/2020 2135 Full Code [id number]  Donnamae Jude, MD ED   04/02/2020 0100 04/02/2020 0121 DNR [id number]  Donnamae Jude, MD ED   03/08/2020 2006 03/22/2020 0032 Full Code [id number]  Arnell Asal, NP ED   03/08/2020 1453 03/08/2020 2006 Full Code [id number]  Lequita Halt, MD ED   11/11/2019 1638 01/10/2020 0023 Full Code [id number]  Erick Colace, NP ED  02/16/2019 2156 02/19/2019 1802 Full Code [id number]  Shela Leff, MD ED   02/16/2019 2156 02/16/2019 2156 Full Code [id number]  Shela Leff, MD ED   12/02/2018 0253 12/02/2018 1917 Full Code [id number]  Shanon Rosser, MD ED   01/23/2017 1618 01/29/2017 1745 Full Code [id number]  Kerrie Buffalo, NP Inpatient   01/15/2016 2001 01/17/2016 1815 Full Code [id number]  Fransico Meadow, PA-C ED   05/11/2015 1122 05/17/2015 1728 Full Code [id number]  Ursula Alert, MD Inpatient   05/10/2015 1759 05/11/2015 0543 Full Code  [id number]  Deno Etienne, DO ED   04/17/2015 1954 04/18/2015 1638 Full Code [id number]  Delfina Redwood, MD Inpatient   01/30/2015 2119 02/08/2015 1621 Full Code [id number]  Harriet Butte, NP Inpatient   01/28/2015 1834 01/30/2015 1942 Full Code [id number]  Donne Hazel, MD ED   05/01/2014 1922 05/02/2014 2005 Full Code [id number]  Mirna Mires, MD ED   01/25/2013 2035 01/26/2013 0537 Full Code [id number]  Clayton Bibles, PA-C ED   06/01/2012 0030 06/01/2012 2239 Full Code [id number]  [facility] Blitz ED   Advance Care Planning Activity        IV Access:    Peripheral IV   Procedures and diagnostic studies:   No results found.   Medical Consultants:    None.  Anti-Infectives:   none  Subjective:    [name] she is screaming and crying with labile emotion not able to communicate.  Objective:    Vitals:   07/31/20 2157 08/01/20 0026 08/01/20 0419 08/01/20 1009  BP: 131/77 138/79 134/74 (!) 145/81  Pulse: 72 80 79 83  Resp: 15 16 17 17   Temp: (!) 97.5 F (36.4 C)  98.3 F (36.8 C) 98.3 F (36.8 C)  TempSrc: Axillary  Oral Oral  SpO2: 100%  100% 99%  Weight:      Height:       SpO2: 99 %   Intake/Output Summary (Last 24 hours) at 08/01/2020 1059 Last data filed at 08/01/2020 1004 Gross per 24 hour  Intake 385 ml  Output 725 ml  Net -340 ml   Filed Weights   07/21/20 1934 07/28/20 0543 07/30/20 0500  Weight: 81 kg 86.2 kg 87.4 kg    Exam: General exam: In no acute distress. Respiratory system: Good air movement and clear to auscultation. Cardiovascular system: S1 & S2 heard, RRR. No JVD. Gastrointestinal system: Abdomen is nondistended, soft and nontender.  Central nervous system: Moving all 4 extremities without any difficulties. Extremities: No pedal edema. Skin: No rashes, lesions or ulcers Psychiatry: Labile emotionally volatile and screaming and crying.   Data Reviewed:    Labs: Basic Metabolic Panel: Recent Labs  Lab  07/27/20 0155 07/27/20 0155 07/27/20 1110 07/28/20 0520 07/28/20 0520 07/29/20 0220 07/29/20 0220 07/29/20 2058 07/31/20 0115 08/01/20 0207  NA 142  --   --  143  --  144  --   --  141 139  K 3.5   < >  --  3.1*   < > 3.0*   < >  --  3.1* 6.5*  CL 111  --   --  110  --  110  --   --  103 104  CO2 16*  --   --  20*  --  18*  --   --  27 26  GLUCOSE 114*  --   --  121*  --  104*  --   --  208* 156*  BUN 17  --   --  14  --  13  --   --  11 8  CREATININE 0.68  --   --  0.72  --  0.87  --   --  0.62 0.68  CALCIUM 8.6*  --   --  8.8*  --  8.6*  --   --  8.4* 8.7*  MG  --   --  1.9  --   --   --   --  1.8 1.8  --   PHOS  --   --   --   --   --   --   --  3.4 2.8  --    < > = values in this interval not displayed.   GFR Estimated Creatinine Clearance: 85.3 mL/min (by C-G formula based on SCr of 0.68 mg/dL). Liver Function Tests: Recent Labs  Lab 07/26/20 0355 07/28/20 0520 07/31/20 0115 08/01/20 0207  AST 24 22 23 26   ALT 18 19 19 21   ALKPHOS 94 74 78 77  BILITOT 1.9* 1.6* 0.3 0.3  PROT 6.3* 5.8* 5.3* 5.4*  ALBUMIN 3.2* 3.1* 2.8* 2.8*   No results for input(s): LIPASE, AMYLASE in the last 168 hours. No results for input(s): AMMONIA in the last 168 hours. Coagulation profile No results for input(s): INR, PROTIME in the last 168 hours. COVID-19 Labs  No results for input(s): DDIMER, FERRITIN, LDH, CRP in the last 72 hours.  Lab Results  Component Value Date   SARSCOV2NAA NEGATIVE 07/20/2020   [place] NEGATIVE 04/02/2020   [place] NEGATIVE 03/08/2020   [place] NEGATIVE 01/08/2020    CBC: Recent Labs  Lab 07/28/20 0520 07/31/20 0115  WBC 9.6 9.2  NEUTROABS 5.6  --   HGB 16.0* 14.2  HCT 46.4* 42.1  MCV 84.7 84.7  PLT 127* 163   Cardiac Enzymes: No results for input(s): CKTOTAL, CKMB, CKMBINDEX, TROPONINI in the last 168 hours. BNP (last 3 results) No results for input(s): PROBNP in the last 8760 hours. CBG: Recent Labs  Lab 07/31/20 1652  07/31/20 1933 08/01/20 0001 08/01/20 0422 08/01/20 0743  GLUCAP 77 87 140* 109* 82   D-Dimer: No results for input(s): DDIMER in the last 72 hours. Hgb A1c: No results for input(s): HGBA1C in the last 72 hours. Lipid Profile: No results for input(s): CHOL, HDL, LDLCALC, TRIG, CHOLHDL, LDLDIRECT in the last 72 hours. Thyroid function studies: No results for input(s): TSH, T4TOTAL, T3FREE, THYROIDAB in the last 72 hours.  Invalid input(s): FREET3 Anemia work up: No results for input(s): VITAMINB12, FOLATE, FERRITIN, TIBC, IRON, RETICCTPCT in the last 72 hours. Sepsis Labs: Recent Labs  Lab 07/28/20 0520 07/31/20 0115  WBC 9.6 9.2   Microbiology No results found for this or any previous visit (from the past 240 hour(s)).   Medications:   .  stroke: mapping our early stages of recovery book   Does not apply Once  . amLODipine  10 mg Per Tube Daily  . chlorhexidine  15 mL Mouth Rinse BID  . Chlorhexidine Gluconate Cloth  6 each Topical Daily  . cloNIDine  0.2 mg Per Tube BID  . dolutegravir  50 mg Per Tube Daily   And  . emtricitabine-tenofovir AF  1 tablet Per Tube Daily  . enoxaparin (LOVENOX) injection  40 mg Subcutaneous Daily  . feeding supplement (PROSource TF)  45 mL Per Tube Daily  . insulin aspart  0-20 Units Subcutaneous Q4H  . insulin aspart  4 Units Subcutaneous Q4H  . insulin detemir  18 Units Subcutaneous  BID  . lacosamide  150 mg Per Tube BID  . lactulose  20 g Per Tube TID  . levETIRAcetam  1,500 mg Per Tube BID  . mouth rinse  15 mL Mouth Rinse q12n4p  . mupirocin ointment   Nasal BID  . pantoprazole sodium  40 mg Per Tube QHS  . PHENObarbital  64.8 mg Per Tube BID  . sodium chloride flush  10-40 mL Intracatheter Q12H  . sodium chloride flush  3 mL Intravenous Once  . valproic acid  500 mg Per Tube Q8H   Continuous Infusions: . feeding supplement (OSMOLITE 1.5 CAL) 1,000 mL (07/31/20 1642)      LOS: 10 days   Charlynne Cousins  [facility]  08/01/2020, 10:59 AM

## 2020-08-02 DIAGNOSIS — Z4659 Encounter for fitting and adjustment of other gastrointestinal appliance and device: Secondary | ICD-10-CM | POA: Diagnosis not present

## 2020-08-02 DIAGNOSIS — G9341 Metabolic encephalopathy: Secondary | ICD-10-CM | POA: Diagnosis not present

## 2020-08-02 DIAGNOSIS — I10 Essential (primary) hypertension: Secondary | ICD-10-CM | POA: Diagnosis not present

## 2020-08-02 DIAGNOSIS — I6783 Posterior reversible encephalopathy syndrome: Secondary | ICD-10-CM | POA: Diagnosis not present

## 2020-08-02 LAB — BASIC METABOLIC PANEL
Anion gap: 7 (ref 5–15)
BUN: 8 mg/dL (ref 6–20)
CO2: 31 mmol/L (ref 22–32)
Calcium: 8.4 mg/dL — ABNORMAL LOW (ref 8.9–10.3)
Chloride: 99 mmol/L (ref 98–111)
Creatinine, Ser: 0.62 mg/dL (ref 0.44–1.00)
GFR, Estimated: >60 mL/min (ref >60)
Glucose, Bld: 93 mg/dL (ref 70–99)
Potassium: 3.6 mmol/L (ref 3.5–5.1)
Sodium: 137 mmol/L (ref 135–145)

## 2020-08-02 LAB — GLUCOSE, CAPILLARY
Glucose-Capillary: 159 mg/dL — ABNORMAL HIGH (ref 70–99)
Glucose-Capillary: 168 mg/dL — ABNORMAL HIGH (ref 70–99)
Glucose-Capillary: 179 mg/dL — ABNORMAL HIGH (ref 70–99)
Glucose-Capillary: 181 mg/dL — ABNORMAL HIGH (ref 70–99)
Glucose-Capillary: 77 mg/dL (ref 70–99)
Glucose-Capillary: 78 mg/dL (ref 70–99)

## 2020-08-02 MED ORDER — TRIAMTERENE-HCTZ 37.5-25 MG PO TABS
1.0000 | ORAL_TABLET | Freq: Every day | ORAL | Status: DC
  Administered 2020-08-02 – 2020-08-04 (×3): 1 via ORAL
  Filled 2020-08-02 (×3): qty 1
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
## 2020-08-02 NOTE — Progress Notes (Addendum)
[facility] PROGRESS NOTE    Progress Note  Susan [name]  XUX:[id number] DOB: February 01, 1961 DOA: 07/20/2020 PCP: Hoyt Koch, MD     Brief Narrative:   Susan [name] is an 59 y.o. female with extensive past medical history schizoaffective disorder/bipolar,  seizures, essential hypertension, diabetes mellitus type 2, HIV polysubstance abuse a history of stroke in August 2021 notable for prolonged hospitalization developed sepsis with respiratory failure during this time she was trach and PEG which is now removed comes into the hospital for encephalopathy on 07/29/2020 concern for press started on IV antihypertensive infusion  Assessment/Plan:   Acute metabolic encephalopathy secondary to stroke/essential hypertension: She was started on Cardene on a mission, her MRI of the brain was compatible with press she has been weaned off Cardene. NG tube was placed as patient was somnolent and she is currently on Norvasc Coreg and clonidine. Her Maxide was started on 08/02/2020 and her blood pressure has been improving since then. She continues to be mildly encephalopathic, but better than yesterday as yesterday she was screaming with labile emotion and hollering and not letting anybody work with her.  New onset epilepsy/chronic stroke: Continuous EEG showed status epilepticus. She is currently on phenobarbital Keppra, Vimpat and Depakote. Neurology on board no seizure events. Continue seizure precautions. We consulted palliative care to discussed with family goals of care family, no family wants to be full code and aggressive care.  Dysphagia: Patient failed swallowing evaluation. We will need to perform a swallowing evaluation sometime this week to see if we can take the NG tube out, the family is very reluctant and they will probably want everything done for her including a PEG tube. Palliative care has met with them see above for further details. Currently with NG tube,  continue tube feedings per nutrition.  History of schizophrenia/schizoaffective disorder/bipolar: Continue BuSpar, neurology recommended to restart Depakote, Seroquel, Vistaril and Zoloft.  Hold Cogentin Yesterday she had labile motion crying and screaming psychiatry was consulted recommended to restart her Depakote Seroquel, Cogentin once more awake.  HIV/AIDS: Continue Hart therapy. Repeated CD4 count outpatient.  Uncontrolled diabetes mellitus type 2: With an A1c of 7.7.  Blood glucose fairly controlled continue long-acting insulin plus sliding scale.  Hyperkalemia: It is improved today at 3.6, I was was high due to potassium supplementation and Maxide.  Maxide was held for 24 hours Pitocin supplementations were stopped and she was given Kayexalate and her potassium is improved. We have resumed Maxide. Repeat a basic metabolic panel in the morning.  DVT prophylaxis: lovenxo Family Communication:none Status is: Inpatient  Remains inpatient appropriate because:Hemodynamically unstable   Dispo: The patient is from: SNF              Anticipated d/c is to: SNF              Anticipated d/c date is: > 3 days              Patient currently is not medically stable to d/c.        Code Status:     Code Status Orders  (From admission, onward)         Start     Ordered   07/21/20 0059  Full code  Continuous        07/21/20 0058        Code Status History    Date Active Date Inactive Code Status Order ID Comments User Context   04/02/2020 0121 04/03/2020 2135 Full Code [id number]  Donnamae Jude, MD ED   04/02/2020 0100 04/02/2020 0121 DNR [id number]  Donnamae Jude, MD ED   03/08/2020 2006 03/22/2020 0032 Full Code [id number]  Arnell Asal, NP ED   03/08/2020 1453 03/08/2020 2006 Full Code [id number]  Lequita Halt, MD ED   11/11/2019 1638 01/10/2020 0023 Full Code [id number]  Erick Colace, NP ED   02/16/2019 2156 02/19/2019 1802 Full Code [id number]  Shela Leff, MD ED    02/16/2019 2156 02/16/2019 2156 Full Code [id number]  Shela Leff, MD ED   12/02/2018 0253 12/02/2018 1917 Full Code [id number]  Shanon Rosser, MD ED   01/23/2017 1618 01/29/2017 1745 Full Code [id number]  Kerrie Buffalo, NP Inpatient   01/15/2016 2001 01/17/2016 1815 Full Code [id number]  Fransico Meadow, PA-C ED   05/11/2015 1122 05/17/2015 1728 Full Code [id number]  Ursula Alert, MD Inpatient   05/10/2015 1759 05/11/2015 0543 Full Code [id number]  Deno Etienne, DO ED   04/17/2015 1954 04/18/2015 1638 Full Code [id number]  Delfina Redwood, MD Inpatient   01/30/2015 2119 02/08/2015 1621 Full Code [id number]  Harriet Butte, NP Inpatient   01/28/2015 1834 01/30/2015 1942 Full Code [id number]  Donne Hazel, MD ED   05/01/2014 1922 05/02/2014 2005 Full Code [id number]  Mirna Mires, MD ED   01/25/2013 2035 01/26/2013 0537 Full Code [id number]  [facility]   06/01/2012 0030 06/01/2012 2239 Full Code [id number]  [facility]   Advance Care Planning Activity        IV Access:    Peripheral IV   Procedures and diagnostic studies:   No results found.   Medical Consultants:    None.  Anti-Infectives:   none  Subjective:    Theona Muhs Nicastro is more sleepy this morning able to respond to verbal commands.  Objective:    Vitals:   08/01/20 1412 08/01/20 2009 08/02/20 0441 08/02/20 0445  BP: 114/75 (!) 121/91  (!) 159/88  Pulse: 80 74  80  Resp: _0 Temp: 98.3 F (36.8 C) 99 F (37.2 C)  97.9 F (36.6 C)  TempSrc: Oral Oral  Oral  SpO2: 92% 100%  100%  Weight:   84.6 kg   Height:       SpO2: 100 %   Intake/Output Summary (Last 24 hours) at 08/02/2020 0954 Last data filed at 08/02/2020 0700 Gross per 24 hour  Intake 914.25 ml  Output 2075 ml  Net -1160.75 ml   Filed Weights   07/28/20 0543 07/30/20 0500 08/02/20 0441  Weight: 86.2 kg 87.4 kg 84.6 kg    Exam: General exam: In no acute distress. Respiratory system: Good air movement and clear to  auscultation. Cardiovascular system: S1 & S2 heard, RRR. No JVD. Gastrointestinal system: Abdomen is nondistended, soft and nontender.  Extremities: No pedal edema. Skin: No rashes, lesions or ulcers Psychiatry: No judgment and insight of medical condition only responsive to verbal command and tactile stimuli.   Data Reviewed:    Labs: Basic Metabolic Panel: Recent Labs  Lab 07/27/20 1110 07/28/20 0520 07/29/20 0220 07/29/20 0220 07/29/20 2058 07/31/20 0115 07/31/20 0115 08/01/20 0207 08/01/20 0207 08/01/20 1150 08/02/20 0223  NA  --    < > 144  --   --  141  --  139  --  140 137  K  --    < > 3.0*   < >  --  3.1*   < > 6.5*   < >  4.9 3.6  CL  --    < > 110  --   --  103  --  104  --  102 99  CO2  --    < > 18*  --   --  27  --  26  --  29 31  GLUCOSE  --    < > 104*  --   --  208*  --  156*  --  117* 93  BUN  --    < > 13  --   --  11  --  8  --  9 8  CREATININE  --    < > 0.87  --   --  0.62  --  0.68  --  0.65 0.62  CALCIUM  --    < > 8.6*  --   --  8.4*  --  8.7*  --  8.7* 8.4*  MG 1.9  --   --   --  1.8 1.8  --   --   --   --   --   PHOS  --   --   --   --  3.4 2.8  --   --   --   --   --    < > = values in this interval not displayed.   GFR Estimated Creatinine Clearance: 84 mL/min (by C-G formula based on SCr of 0.62 mg/dL). Liver Function Tests: Recent Labs  Lab 07/28/20 0520 07/31/20 0115 08/01/20 0207  AST _0 ALT _1 ALKPHOS 74 78 77  BILITOT 1.6* 0.3 0.3  PROT 5.8* 5.3* 5.4*  ALBUMIN 3.1* 2.8* 2.8*   No results for input(s): LIPASE, AMYLASE in the last 168 hours. No results for input(s): AMMONIA in the last 168 hours. Coagulation profile No results for input(s): INR, PROTIME in the last 168 hours. COVID-19 Labs  No results for input(s): DDIMER, FERRITIN, LDH, CRP in the last 72 hours.  Lab Results  Component Value Date   SARSCOV2NAA NEGATIVE 07/20/2020   Gravity NEGATIVE 04/02/2020   [place] NEGATIVE 03/08/2020    [place] NEGATIVE 01/08/2020    CBC: Recent Labs  Lab 07/28/20 0520 07/31/20 0115  WBC 9.6 9.2  NEUTROABS 5.6  --   HGB 16.0* 14.2  HCT 46.4* 42.1  MCV 84.7 84.7  PLT 127* 163   Cardiac Enzymes: No results for input(s): CKTOTAL, CKMB, CKMBINDEX, TROPONINI in the last 168 hours. BNP (last 3 results) No results for input(s): PROBNP in the last 8760 hours. CBG: Recent Labs  Lab 08/01/20 1634 08/01/20 2005 08/02/20 0011 08/02/20 0427 08/02/20 0730  GLUCAP 120* 104* 179* 78 159*   D-Dimer: No results for input(s): DDIMER in the last 72 hours. Hgb A1c: No results for input(s): HGBA1C in the last 72 hours. Lipid Profile: No results for input(s): CHOL, HDL, LDLCALC, TRIG, CHOLHDL, LDLDIRECT in the last 72 hours. Thyroid function studies: No results for input(s): TSH, T4TOTAL, T3FREE, THYROIDAB in the last 72 hours.  Invalid input(s): FREET3 Anemia work up: No results for input(s): VITAMINB12, FOLATE, FERRITIN, TIBC, IRON, RETICCTPCT in the last 72 hours. Sepsis Labs: Recent Labs  Lab 07/28/20 0520 07/31/20 0115  WBC 9.6 9.2   Microbiology No results found for this or any previous visit (from the past 240 hour(s)).   Medications:   .  stroke: mapping our early stages of recovery book   Does not apply Once  . amLODipine  10 mg Per Tube  Daily  . benztropine  0.5 mg Oral QHS  . bictegravir-emtricitabine-tenofovir AF  1 tablet Oral Daily  . chlorhexidine  15 mL Mouth Rinse BID  . Chlorhexidine Gluconate Cloth  6 each Topical Daily  . cloNIDine  0.2 mg Per Tube BID  . enoxaparin (LOVENOX) injection  40 mg Subcutaneous Daily  . feeding supplement (PROSource TF)  45 mL Per Tube Daily  . hydrochlorothiazide  25 mg Oral Daily  . insulin aspart  0-20 Units Subcutaneous Q4H  . insulin aspart  4 Units Subcutaneous Q4H  . insulin detemir  18 Units Subcutaneous BID  . lacosamide  150 mg Per Tube BID  . lactulose  20 g Per Tube TID  . levETIRAcetam  1,500 mg Per Tube  BID  . mouth rinse  15 mL Mouth Rinse q12n4p  . mupirocin ointment   Nasal BID  . pantoprazole sodium  40 mg Per Tube QHS  . PHENObarbital  64.8 mg Per Tube BID  . QUEtiapine  200 mg Oral BID  . sertraline  50 mg Oral QHS  . sodium chloride flush  10-40 mL Intracatheter Q12H  . sodium chloride flush  3 mL Intravenous Once  . valproic acid  500 mg Per Tube Q8H   Continuous Infusions: . feeding supplement (OSMOLITE 1.5 CAL) 1,000 mL (08/01/20 1539)      LOS: 11 days   Charlynne Cousins  [facility]  08/02/2020, 9:54 AM

## 2020-08-02 NOTE — Progress Notes (Signed)
Physical Therapy Treatment Patient Details Name: Susan [name] MRN: [id number] DOB: 1961/08/22 Today's Date: 08/02/2020    History of Present Illness Pt is a 59 y.o. female admitted 07/20/20 with AMS. MRI compatible with posterior reversible encephalopathy syndrome (PRES); chronic bilateral posterior MCA and right PCA/MCA distribution infarcts. EEG showed new onset status epilepticus. PMH includes schizoaffective/bipolar disorder, seizures, HTN, DM2, HIV, polysubstance abuse; of note, recent admissions with prolonged stay 11/11/19-01/09/20 for sepsis s/p trach/PEG and removal, and 03/2020 with stroke.   PT Comments    Pt initially with improved alertness this session, verbalizing a few words (although not appropriate), but still not following commands. Able to perform sitting EOB activity, requiring up to maxA; unable to achieve standing this session. Pt quick to fatigue with eyes closing and less interaction requiring return to supine; requiring max verbal stimuli to open eyes by end of session (RN aware). Continue to recommend SNF-level therapies to maximize functional mobility and independence.   Follow Up Recommendations  SNF;Supervision/Assistance - 24 hour     Equipment Recommendations  Wheelchair (measurements PT);Wheelchair cushion (measurements PT)    Recommendations for Other Services       Precautions / Restrictions Precautions Precautions: Fall;Other (comment) Precaution Comments: NGT; AMS with LUE soft mitt and wrist restraint; R-side hemiparesis Restrictions Weight Bearing Restrictions: No    Mobility  Bed Mobility Overal bed mobility: Needs Assistance Bed Mobility: Supine to Sit;Sit to Supine     Supine to sit: Mod assist Sit to supine: Max assist   General bed mobility comments: Pt attempting to sit up on L-side bed, not following commands for sequencing, modA to assist trunk elevation and scooting to EOB; maxA for BLE and trunk management return to supine; maxA  repositioning in bed  Transfers Overall transfer level: Needs assistance               General transfer comment: Attempted to stand, pt able to translate weight anteriorly with modA, but then with significant forward lean requiring maxA to prevent from sliding off bed; not initiating standing beyond this despite maxA and max, multimodal cues  Ambulation/Gait                 Stairs             Wheelchair Mobility    Modified Rankin (Stroke Patients Only)       Balance Overall balance assessment: Needs assistance Sitting-balance support: Bilateral upper extremity supported;Feet supported Sitting balance-Leahy Scale: Poor Sitting balance - Comments: Difficult to assess true sitting balance; able to maintain static sitting with LUE support, but randomly losing balance in all directions requiring assist to correct, then pushing in that direction and resisting assist to correct                                    Cognition Arousal/Alertness: Awake/alert;Lethargic Behavior During Therapy: Flat affect Overall Cognitive Status: No family/caregiver present to determine baseline cognitive functioning                                 General Comments: Initially alert with a few verbal responses, although not appropriate, and not following comands; increased fatigue/lethargy once sitting up with return to supine, then pt minimally responding requiring max verbal cues to awaken to name      Exercises      General Comments  Pertinent Vitals/Pain Pain Assessment: Faces Faces Pain Scale: No hurt Pain Intervention(s): Monitored during session    Home Living                      Prior Function            PT Goals (current goals can now be found in the care plan section) Acute Rehab PT Goals PT Goal Formulation: Patient unable to participate in goal setting Time For Goal Achievement: 08/16/20 Potential to Achieve Goals:  Fair Progress towards PT goals: Progressing toward goals    Frequency    Min 2X/week      PT Plan Current plan remains appropriate    Co-evaluation              AM-PAC PT "6 Clicks" Mobility   Outcome Measure  Help needed turning from your back to your side while in a flat bed without using bedrails?: A Lot Help needed moving from lying on your back to sitting on the side of a flat bed without using bedrails?: A Lot Help needed moving to and from a bed to a chair (including a wheelchair)?: Total Help needed standing up from a chair using your arms (e.g., wheelchair or bedside chair)?: Total Help needed to walk in hospital room?: Total Help needed climbing 3-5 steps with a railing? : Total 6 Click Score: 8    End of Session   Activity Tolerance: Patient tolerated treatment well;Patient limited by fatigue Patient left: in bed;with call bell/phone within reach;with bed alarm set (with LUE soft mitt and wrist restraint reapplied) Nurse Communication: Mobility status PT Visit Diagnosis: Muscle weakness (generalized) (M62.81);Other abnormalities of gait and mobility (R26.89)     Time: 7622-6333 PT Time Calculation (min) (ACUTE ONLY): 18 min  Charges:  $Therapeutic Activity: 8-22 mins                    Mabeline Caras, PT, DPT Acute Rehabilitation Services  Pager [phone] Office [place] 08/02/2020, 2:04 PM

## 2020-08-03 ENCOUNTER — Other Ambulatory Visit: Payer: Self-pay | Admitting: *Deleted

## 2020-08-03 ENCOUNTER — Inpatient Hospital Stay (HOSPITAL_COMMUNITY): Payer: Medicare Other

## 2020-08-03 LAB — GLUCOSE, CAPILLARY
Glucose-Capillary: 103 mg/dL — ABNORMAL HIGH (ref 70–99)
Glucose-Capillary: 129 mg/dL — ABNORMAL HIGH (ref 70–99)
Glucose-Capillary: 165 mg/dL — ABNORMAL HIGH (ref 70–99)
Glucose-Capillary: 181 mg/dL — ABNORMAL HIGH (ref 70–99)
Glucose-Capillary: 188 mg/dL — ABNORMAL HIGH (ref 70–99)
Glucose-Capillary: 89 mg/dL (ref 70–99)

## 2020-08-03 IMAGING — DX DG ABDOMEN 1V
1 series · 1 of 1 positions shown · non-contrast
Comparison: Radiograph [DATE], CT [DATE]

CLINICAL DATA: NG tube placement

EXAM:
ABDOMEN - 1 VIEW

[abdomen supine]
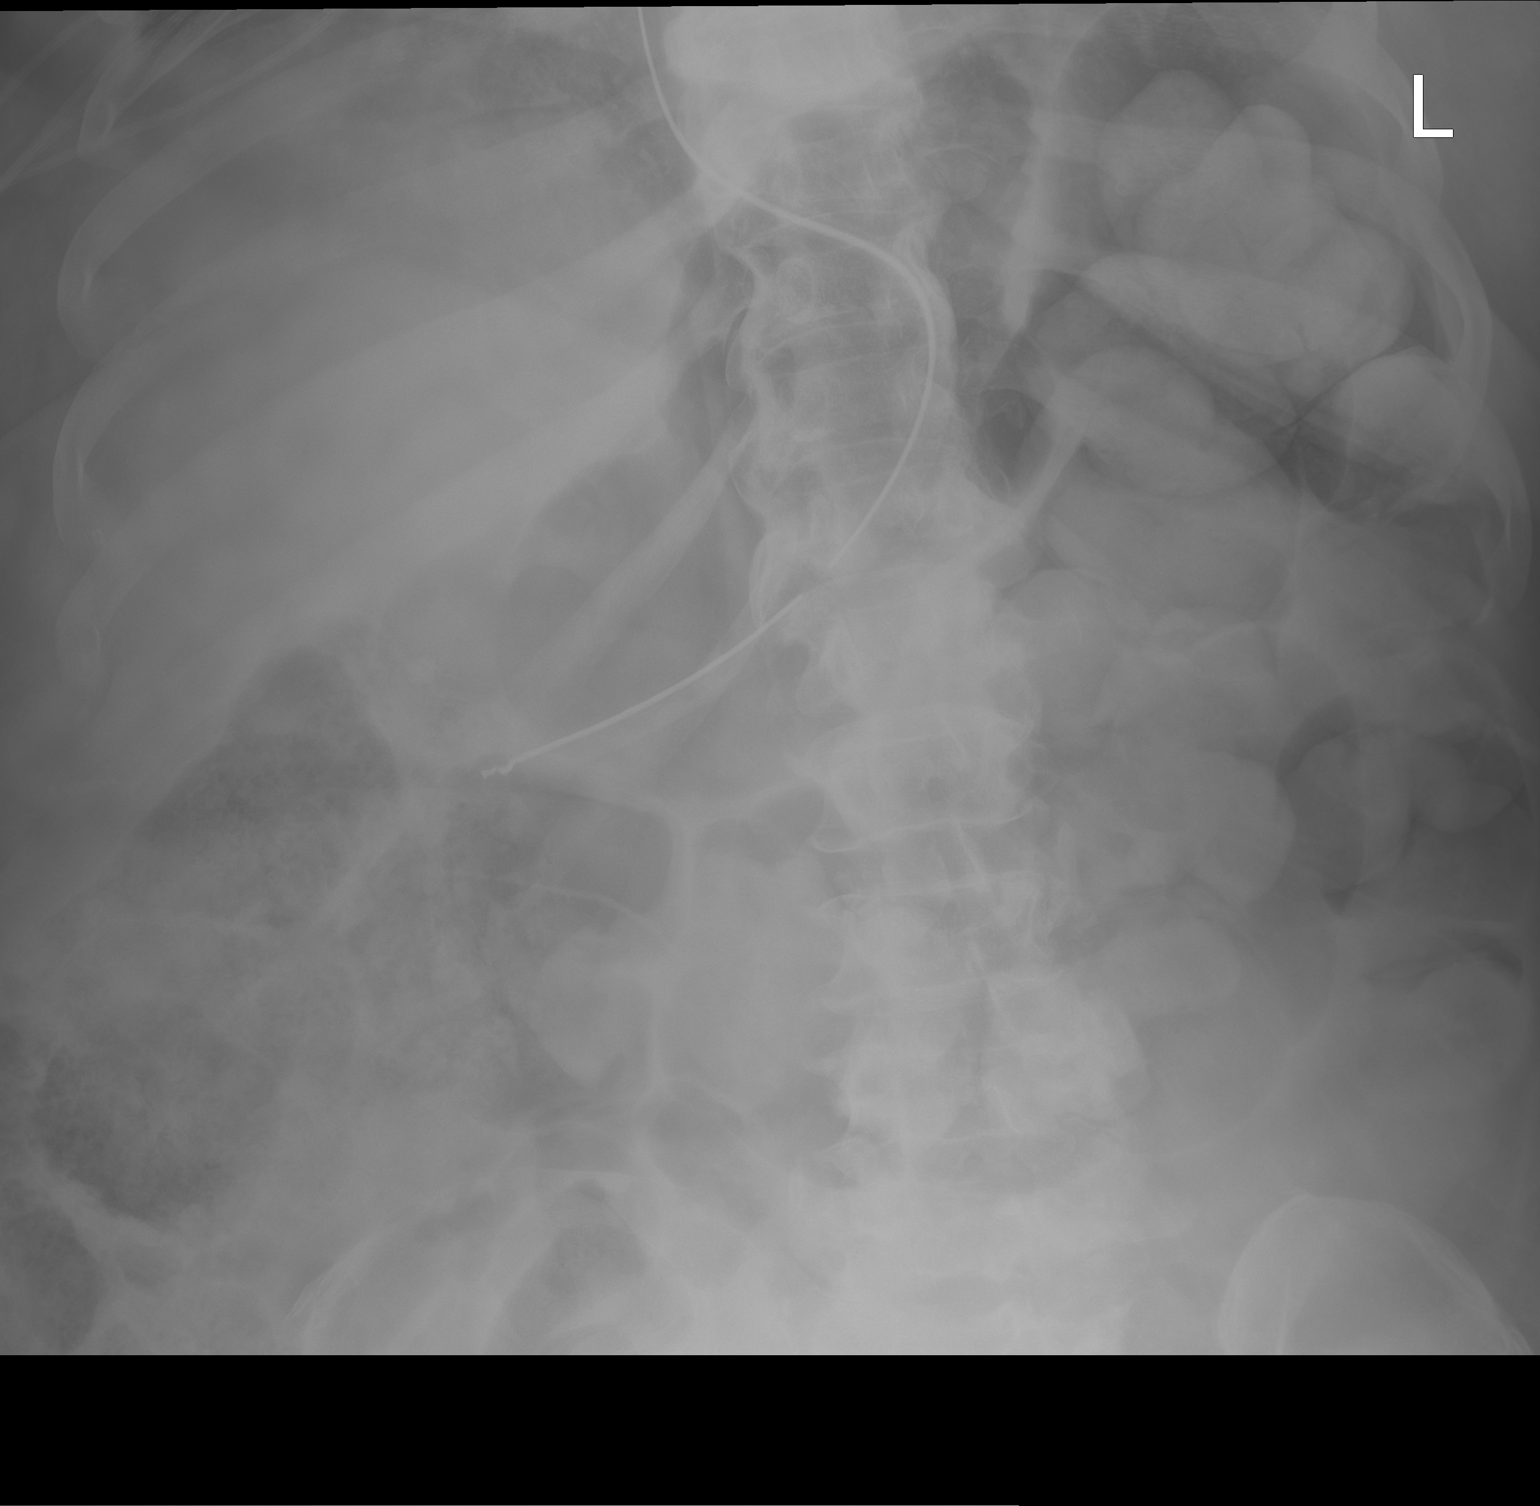

[1 of 1 positions shown; findings below may reference images not displayed]

FINDINGS: Imaging completely encompasses the abdomen. Transesophageal tube tip
terminates near the gastric antrum/duodenal bulb with side port
distal to the GE junction. Multiple air-filled loops of large and
small bowel without frank distention. No suspicious calcifications.
Degenerative changes in the spine and pelvis as imaged.
IMPRESSION: Transesophageal tube tip terminates near the gastric antrum/duodenal
bulb with side port distal to the GE junction.

Multiple air-filled loops of bowel without high-grade obstruction. A
mild ileus or partial obstruction is not excluded.

## 2020-08-03 MED ORDER — ADULT MULTIVITAMIN W/MINERALS CH
1.0000 | ORAL_TABLET | Freq: Every day | ORAL | Status: DC
  Administered 2020-08-03 – 2020-08-04 (×2): 1 via ORAL
  Filled 2020-08-03 (×2): qty 1

## 2020-08-03 NOTE — NC FL2 (Signed)
[place] MEDICAID FL2 LEVEL OF CARE SCREENING TOOL     IDENTIFICATION  Patient Name: Susan [name] Birthdate: 1961/03/02 Sex: female Admission Date (Current Location): 07/20/2020  [facility] and [place] Number:  Herbalist and Address:  [facility], [place] [address] [place], [place] [address]      Provider Number: [id number]  Attending Physician Name and Address:  Shawna Clamp, MD  Relative Name and Phone Number:  Jakerra, Floyd (Daughter) [phone] [facility])    Current Level of Care: Hospital Recommended Level of Care: [place] Prior Approval Number:    Date Approved/Denied: 08/03/20 PASRR Number: Pending  Discharge Plan: SNF    Current Diagnoses: Patient Active Problem List   Diagnosis Date Noted  . PRES (posterior reversible encephalopathy syndrome) 07/31/2020  . Palliative care encounter   . Metabolic encephalopathy 35/57/3220  . Altered mental status 07/20/2020  . Encephalopathy 04/02/2020  . Acute metabolic encephalopathy 25/42/7062  . Muscle spasm 04/01/2020  . Acute encephalopathy 03/08/2020  . Palliative care by specialist   . Adult failure to thrive   . Hx of completed stroke 11/24/2019  . Acute kidney injury (AKI) with acute tubular necrosis (ATN) (HCC)   . Aortic valve endocarditis 11/14/2019  . Recurrent falls 10/15/2019  . Constipation 02/26/2019  . Schizophrenia ([place]) 02/18/2019  . AKI (acute kidney injury) ([place]) 02/17/2019  . Chronic respiratory failure ([place]) 02/16/2019  . Right hip pain 02/21/2018  . DNR (do not resuscitate) discussion 08/23/2017  . Weakness generalized 06/24/2017  . Osteoarthritis 07/14/2016  . Abnormal hemoglobin ([place]) 04/18/2016  . Hot flashes 10/16/2015  . Human immunodeficiency virus (HIV) disease ([place]) 10/05/2015  . PTSD (post-traumatic stress disorder) 05/12/2015  . MDD (major depressive disorder), recurrent episode, moderate ([place]) 05/11/2015  . Cannabis use  disorder, severe, dependence ([place]) 05/11/2015  . Tobacco use disorder 05/11/2015  . Uncontrolled type 2 diabetes mellitus with hyperglycemia, with long-term current use of insulin ([place]) 04/18/2015  . Diabetes mellitus type 2 without retinopathy ([place]) 01/28/2015  . Hereditary and idiopathic peripheral neuropathy 06/03/2013  . Dysfunctional uterine bleeding 06/04/2012  . Dyslipidemia   . GERD (gastroesophageal reflux disease)   . Schizoaffective disorder, bipolar type ([place]) 04/26/2011  . Morbid obesity ([place]) 04/26/2011  . HIV disease ([place]) 03/28/2011  . Essential hypertension 03/28/2011    Orientation RESPIRATION BLADDER Height & Weight        Normal External catheter, Incontinent Weight: 190 lb 7.6 oz (86.4 kg) Height:  5\' 6"  (167.6 cm)  BEHAVIORAL SYMPTOMS/MOOD NEUROLOGICAL BOWEL NUTRITION STATUS     (New onset epilepsy/chronic stroke) Incontinent Diet (See d/c summary- NG tube will be removied prior to d/c)  AMBULATORY STATUS COMMUNICATION OF NEEDS Skin   Extensive Assist Verbally Normal                       Personal Care Assistance Level of Assistance  Bathing, Feeding, Dressing Bathing Assistance: Maximum assistance Feeding assistance: Limited assistance Dressing Assistance: Maximum assistance     Functional Limitations Info  Sight, Hearing, Speech Sight Info: Adequate Hearing Info: Adequate Speech Info: Adequate    SPECIAL CARE FACTORS FREQUENCY  PT (By licensed PT), OT (By licensed OT), Speech therapy     PT Frequency: 5x/week OT Frequency: 5x/week     Speech Therapy Frequency: 5x/week      Contractures Contractures Info: Not present    Additional Factors Info  Code Status, Allergies, Isolation Precautions, Insulin Sliding Scale, Psychotropic Code Status  Info: FULL Allergies Info: Lyrica Psychotropic Info: PHENObarbital 20 MG/5ML elixir 64.8 mg;  (SEROQUEL) tablet 200 mgDose: 200 mgFreq: 2 times daily ;sertraline (ZOLOFT) tablet 50 mg; haloperidol  (HALDOL) tablet 1 mg Insulin Sliding Scale Info: insulin aspart (novoLOG) injection 0-20 Units Isolation Precautions Info: MRSA     Current Medications (08/03/2020):  This is the current hospital active medication list Current Facility-Administered Medications  Medication Dose Route Frequency Provider Last Rate Last Admin  .  stroke: mapping our early stages of recovery book   Does not apply Once Swayze, Ava, DO      . acetaminophen (TYLENOL) tablet 650 mg  650 mg Oral Q4H PRN Jacky Kindle, MD       Or  . acetaminophen (TYLENOL) 160 MG/5ML solution 650 mg  650 mg Per Tube Q4H PRN Jacky Kindle, MD       Or  . acetaminophen (TYLENOL) suppository 650 mg  650 mg Rectal Q4H PRN Jacky Kindle, MD      . amLODipine (NORVASC) tablet 10 mg  10 mg Per Tube Daily Candee Furbish, MD   10 mg at 08/03/20 1036  . bictegravir-emtricitabine-tenofovir AF (BIKTARVY) 50-200-25 MG per tablet 1 tablet  1 tablet Oral Daily Charlynne Cousins, MD   1 tablet at 08/03/20 1034  . busPIRone (BUSPAR) tablet 15 mg  15 mg Per Tube TID PRN Jacky Kindle, MD      . chlorhexidine (PERIDEX) 0.12 % solution 15 mL  15 mL Mouth Rinse BID Jacky Kindle, MD   15 mL at 08/03/20 1034  . Chlorhexidine Gluconate Cloth 2 % PADS 6 each  6 each Topical Daily Kerney Elbe, MD   6 each at 08/03/20 1000  . cloNIDine (CATAPRES) tablet 0.2 mg  0.2 mg Per Tube BID Jacky Kindle, MD   0.2 mg at 08/03/20 1036  . enoxaparin (LOVENOX) injection 40 mg  40 mg Subcutaneous Daily Swayze, Ava, DO   40 mg at 08/03/20 1037  . feeding supplement (OSMOLITE 1.5 CAL) liquid 1,000 mL  1,000 mL Per Tube Continuous Jacky Kindle, MD 55 mL/hr at 08/01/20 1539 1,000 mL at 08/01/20 1539  . feeding supplement (PROSource TF) liquid 45 mL  45 mL Per Tube Daily Jacky Kindle, MD   45 mL at 08/03/20 1034  . haloperidol (HALDOL) tablet 1 mg  1 mg Per Tube Q4H PRN Jacky Kindle, MD       Or  . haloperidol lactate (HALDOL) injection 1 mg  1 mg Intramuscular Q4H PRN  Jacky Kindle, MD   1 mg at 08/01/20 0607  . insulin aspart (novoLOG) injection 0-20 Units  0-20 Units Subcutaneous Q4H Charlynne Cousins, MD   4 Units at 08/03/20 1312  . insulin aspart (novoLOG) injection 4 Units  4 Units Subcutaneous Q4H Charlynne Cousins, MD   4 Units at 08/03/20 1312  . insulin detemir (LEVEMIR) injection 18 Units  18 Units Subcutaneous BID Charlynne Cousins, MD   18 Units at 08/03/20 1038  . labetalol (NORMODYNE) injection 10 mg  10 mg Intravenous Q6H PRN Jacky Kindle, MD   10 mg at 07/31/20 0134  . lacosamide (VIMPAT) tablet 150 mg  150 mg Per Tube BID Jacky Kindle, MD   150 mg at 08/03/20 1036  . lactulose (CHRONULAC) 10 GM/15ML solution 20 g  20 g Per Tube TID Jacky Kindle, MD   20 g at 08/03/20 1034  . levETIRAcetam (KEPPRA) 100 MG/ML solution 1,500 mg  1,500 mg Per Tube BID Jacky Kindle,  MD   1,500 mg at 08/03/20 1034  . LORazepam (ATIVAN) injection 1 mg  1 mg Intravenous Q2H PRN Swayze, Ava, DO      . LORazepam (ATIVAN) injection 2 mg  2 mg Intravenous PRN Swayze, Ava, DO   2 mg at 07/29/20 0048  . MEDLINE mouth rinse  15 mL Mouth Rinse q12n4p Jacky Kindle, MD   15 mL at 08/02/20 1853  . multivitamin with minerals tablet 1 tablet  1 tablet Oral Daily Shawna Clamp, MD   1 tablet at 08/03/20 1311  . mupirocin ointment (BACTROBAN) 2 %   Nasal BID Kerney Elbe, MD   Given at 08/03/20 1037  . ondansetron (ZOFRAN) injection 4 mg  4 mg Intravenous Q6H PRN Swayze, Ava, DO      . pantoprazole sodium (PROTONIX) 40 mg/20 mL oral suspension 40 mg  40 mg Per Tube QHS Alvira Philips, RPH   40 mg at 08/02/20 2158  . PHENObarbital 20 MG/5ML elixir 64.8 mg  64.8 mg Per Tube BID Jacky Kindle, MD   64.8 mg at 08/03/20 1033  . polyethylene glycol (MIRALAX / GLYCOLAX) packet 17 g  17 g Per Tube Daily PRN Jacky Kindle, MD   17 g at 07/30/20 1346  . QUEtiapine (SEROQUEL) tablet 200 mg  200 mg Oral BID Charlynne Cousins, MD   200 mg at 08/03/20 1036  . sertraline  (ZOLOFT) tablet 50 mg  50 mg Oral QHS Charlynne Cousins, MD   50 mg at 08/02/20 2156  . sodium chloride flush (NS) 0.9 % injection 3 mL  3 mL Intravenous Once Swayze, Ava, DO      . triamterene-hydrochlorothiazide (MAXZIDE-25) 37.5-25 MG per tablet 1 tablet  1 tablet Oral Daily Charlynne Cousins, MD   1 tablet at 08/03/20 1036  . valproic acid (DEPAKENE) 250 MG/5ML solution 500 mg  500 mg Per Tube Janina Mayo, MD   500 mg at 08/03/20 0607     Discharge Medications: Please see discharge summary for a list of discharge medications.  Relevant Imaging Results:  Relevant Lab Results:   Additional Information SSN [id number]  Bethann Berkshire, LCSW

## 2020-08-03 NOTE — Progress Notes (Signed)
  Speech Language Pathology Treatment: Dysphagia  Patient Details Name: Susan [name] MRN: [id number] DOB: 12-Jul-1961 Today's Date: 08/03/2020 Time: 1050-1106 SLP Time Calculation (min) (ACUTE ONLY): 16 min  Assessment / Plan / Recommendation Clinical Impression  Pt was seen for dysphagia treatment and was quite confused and distractible. She perseverated on the phrases "I'm scared" and "what's wrong" throughout the entirety of the session. She demonstrated difficulty attending to POs at times, requiring max multimodal cueing. She accepted POs of thin liquid, puree, and solids without any overt s/sx of aspiration. Prolonged mastication of solids was observed and mod verbal cues were required to remind the pt to continue chewing. A dys 2 diet with thin liquids is recommended, and SLP will sign off at this time. If the pt's mentation improves and she expresses an interest in a regular diet, SLP can be reordered.    HPI HPI: Susan [name] is a 59 y.o. female with extensive PMH schizoaffective disorder/bipolar, seizures, essential hypertension, diabetes mellitus type 2, HIV, polysubstance abuse. History of stroke in August 2021 notable for prolonged hospitalization- developed sepsis with respiratory failure during this time she was trach and PEG which is now removed. Admitted presently for encephalopathy, MRI of the brain was done that compatible with PRES. EEG showed new onset status epilepticus. Pt is well known to ST service from previous hospitalizations- the pt typically demonstrates a mild-moderate pharyngeal dysphagia exacerbated by cognitive impairment/lethargy. As the pt's condition improves, he swallow improves as well. Last swallow evaluation in July 2021 recommended regular/thin with liquid wash.      SLP Plan  All goals met;Discharge SLP treatment due to (comment)       Recommendations  Diet recommendations: Dysphagia 2 (fine chop);Thin liquid Liquids provided via: Straw Medication  Administration: Crushed with puree Supervision: Full supervision/cueing for compensatory strategies Compensations: Minimize environmental distractions Postural Changes and/or Swallow Maneuvers: Seated upright 90 degrees                Oral Care Recommendations: Oral care BID Follow up Recommendations: Skilled Nursing facility SLP Visit Diagnosis: Dysphagia, unspecified (R13.10) Plan: All goals met;Discharge SLP treatment due to (comment)       GO                Greggory Keen 08/03/2020, 11:43 AM

## 2020-08-03 NOTE — Patient Outreach (Signed)
[facility]) Care Management  08/03/2020  [facility] 05/24/1961 [id number]   Case Closure  Pt remains hospitalized > 10 days and no longer eligible for [facility] services. Case will be closed and provider notified.  Raina Mina, RN Care Management Coordinator [place] Office [phone]

## 2020-08-03 NOTE — Progress Notes (Signed)
PROGRESS NOTE    Susan [name]  VOJ:[id number] DOB: November 14, 1960 DOA: 07/20/2020 PCP: Hoyt Koch, MD    Brief Narrative: Susan [name] is a 59 y.o. female with extensive past medical history,  schizoaffective disorder/bipolar disorder,  seizures, essential hypertension, diabetes mellitus type 2, HIV,  polysubstance abuse and history of stroke in August 2021 notable for prolonged hospitalization,  developed sepsis with respiratory failure during that time.  She had trach and PEG which is now removed,  comes into the hospital for acute encephalopathy on 07/29/2020 .  There was a concern for stroke. Patient was admitted for acute metabolic encephalopathy possibly multifactorial,  could be secondary to stroke / hypertensive urgency.  Assessment & Plan:   Active Problems:   Essential hypertension   Schizoaffective disorder, bipolar type (HCC)   GERD (gastroesophageal reflux disease)   Uncontrolled type 2 diabetes mellitus with hyperglycemia, with long-term current use of insulin (HCC)   Human immunodeficiency virus (HIV) disease (HCC)   Metabolic encephalopathy   PRES (posterior reversible encephalopathy syndrome)   Palliative care encounter   Acute metabolic encephalopathy secondary to stroke/essential hypertension: She was started on Cardene on admission, her MRI of the brain was compatible with PRES. She has been weaned off Cardene. NG tube was placed as patient was somnolent and she is currently on Norvasc, Coreg and clonidine. Her Maxide was started on 08/02/2020 and her blood pressure has been improving since then. Continue Coreg, Norvasc, clonidine and Maxide. She continues to be mildly encephalopathic, but improving.   She appears much improved,  states that she is feeling better.   New onset epilepsy/chronic stroke: Continuous EEG showed status epilepticus. She is currently on phenobarbital, Keppra, Vimpat and Depakote. Neurology on board,  no seizure  events. Continue seizure precautions. Neurology recommended increasing Keppra if there is further seizures,  continue others at same dosing. We consulted palliative care to discussed with family goals of care family, family wants to be full code and aggressive care.  Dysphagia: Patient failed swallowing evaluation. Needs repeat swallowing evaluation sometime this week to see if we can take the NG tube out, Speech recommended Dysphagia 2 Diet. Family is very reluctant and they will probably want everything done for her including a PEG tube. Palliative care has met with family , Currently with NG tube, continue tube feedings per nutrition.  History of schizophrenia/schizoaffective disorder/bipolar: Continue BuSpar, neurology recommended to restart Depakote, Seroquel, Vistaril and Zoloft.  Hold Cogentin 11/29 she had labile mood,  crying and screaming psychiatry was consulted recommended to restart her Depakote Seroquel, Cogentin once more awake.  HIV/AIDS: Continue Hart therapy. Repeated CD4 count outpatient.  Uncontrolled diabetes mellitus type 2: Hb A1c of 7.7.  Blood glucose fairly controlled,   continue long-acting insulin plus sliding scale.  Hyperkalemia: It is improved today at 3.6,  Maxide was held for 24 hours.   We have resumed Maxide. Repeat a basic metabolic panel in the morning.   DVT prophylaxis: (Lovenox/Heparin/SCD's/anticoagulated/None (if comfort care) Code Status: Full Family Communication: No   Disposition Plan:    Status is: Inpatient  Remains inpatient appropriate because:Inpatient level of care appropriate due to severity of illness   Dispo: The patient is from: Home              Anticipated d/c is to: SNF              Anticipated d/c date is: > 3 days  Patient currently is not medically stable to d/c.   Consultants:   Neurology and psychiatry  Procedures:  None Antimicrobials:  Anti-infectives (From admission, onward)    Start     Dose/Rate Route Frequency Ordered Stop   08/02/20 1000  bictegravir-emtricitabine-tenofovir AF (BIKTARVY) 50-200-25 MG per tablet 1 tablet        1 tablet Oral Daily 08/01/20 1114     07/31/20 1000  dolutegravir (TIVICAY) tablet 50 mg  Status:  Discontinued       "And" Linked Group Details   50 mg Per Tube Daily 07/30/20 1023 08/01/20 1121   07/31/20 1000  emtricitabine-tenofovir AF (DESCOVY) 200-25 MG per tablet 1 tablet  Status:  Discontinued       "And" Linked Group Details   1 tablet Per Tube Daily 07/30/20 1023 08/01/20 1121   07/21/20 1000  bictegravir-emtricitabine-tenofovir AF (BIKTARVY) 50-200-25 MG per tablet 1 tablet  Status:  Discontinued        1 tablet Oral Daily 07/21/20 0058 07/30/20 1023     Subjective: Patient was seen and examined at bedside.  Overnight events noted.  She reports feeling better,  she appears improved,  denies any concerns.  She was having applesauce,  tolerating well.  Objective: Vitals:   08/02/20 2019 08/03/20 0356 08/03/20 0405 08/03/20 1316  BP: 122/65 (!) 142/78  (!) 107/55  Pulse: 93 94  87  Resp: 18 18  14   Temp: 97.6 F (36.4 C) 98.5 F (36.9 C)  (!) 97.5 F (36.4 C)  TempSrc:    Oral  SpO2: 98% 99%  100%  Weight:   86.4 kg   Height:        Intake/Output Summary (Last 24 hours) at 08/03/2020 1611 Last data filed at 08/03/2020 1000 Gross per 24 hour  Intake 1440 ml  Output 1001 ml  Net 439 ml   Filed Weights   07/30/20 0500 08/02/20 0441 08/03/20 0405  Weight: 87.4 kg 84.6 kg 86.4 kg    Examination:  General exam: Appears calm and comfortable, not in any acute distress. Respiratory system: Clear to auscultation. Respiratory effort normal. Cardiovascular system: S1 & S2 heard, RRR. No JVD, murmurs, rubs, gallops or clicks. No pedal edema. Gastrointestinal system: Abdomen is nondistended, soft and nontender. No organomegaly or masses felt. Normal bowel sounds heard. Central nervous system: Alert and oriented. No focal  neurological deficits. Extremities: No edema, no cyanosis, no clubbing. Skin: No rashes, lesions or ulcers Psychiatry: Judgement and insight appear normal. Mood & affect appropriate.     Data Reviewed: I have personally reviewed following labs and imaging studies  CBC: Recent Labs  Lab 07/28/20 0520 07/31/20 0115  WBC 9.6 9.2  NEUTROABS 5.6  --   HGB 16.0* 14.2  HCT 46.4* 42.1  MCV 84.7 84.7  PLT 127* 607   Basic Metabolic Panel: Recent Labs  Lab 07/29/20 0220 07/29/20 2058 07/31/20 0115 08/01/20 0207 08/01/20 1150 08/02/20 0223  NA 144  --  141 139 140 137  K 3.0*  --  3.1* 6.5* 4.9 3.6  CL 110  --  103 104 102 99  CO2 18*  --  27 26 29 31   GLUCOSE 104*  --  208* 156* 117* 93  BUN 13  --  11 8 9 8   CREATININE 0.87  --  0.62 0.68 0.65 0.62  CALCIUM 8.6*  --  8.4* 8.7* 8.7* 8.4*  MG  --  1.8 1.8  --   --   --  PHOS  --  3.4 2.8  --   --   --    GFR: Estimated Creatinine Clearance: 84.8 mL/min (by C-G formula based on SCr of 0.62 mg/dL). Liver Function Tests: Recent Labs  Lab 07/28/20 0520 07/31/20 0115 08/01/20 0207  AST 22 23 26   ALT 19 19 21   ALKPHOS 74 78 77  BILITOT 1.6* 0.3 0.3  PROT 5.8* 5.3* 5.4*  ALBUMIN 3.1* 2.8* 2.8*   No results for input(s): LIPASE, AMYLASE in the last 168 hours. No results for input(s): AMMONIA in the last 168 hours. Coagulation Profile: No results for input(s): INR, PROTIME in the last 168 hours. Cardiac Enzymes: No results for input(s): CKTOTAL, CKMB, CKMBINDEX, TROPONINI in the last 168 hours. BNP (last 3 results) No results for input(s): PROBNP in the last 8760 hours. HbA1C: No results for input(s): HGBA1C in the last 72 hours. CBG: Recent Labs  Lab 08/02/20 2016 08/03/20 0017 08/03/20 0353 08/03/20 0751 08/03/20 1212  GLUCAP 181* 165* 103* 89 188*   Lipid Profile: No results for input(s): CHOL, HDL, LDLCALC, TRIG, CHOLHDL, LDLDIRECT in the last 72 hours. Thyroid Function Tests: No results for input(s):  TSH, T4TOTAL, FREET4, T3FREE, THYROIDAB in the last 72 hours. Anemia Panel: No results for input(s): VITAMINB12, FOLATE, FERRITIN, TIBC, IRON, RETICCTPCT in the last 72 hours. Sepsis Labs: No results for input(s): PROCALCITON, LATICACIDVEN in the last 168 hours.  No results found for this or any previous visit (from the past 240 hour(s)).   Radiology Studies: No results found.  Scheduled Meds: .  stroke: mapping our early stages of recovery book   Does not apply Once  . amLODipine  10 mg Per Tube Daily  . bictegravir-emtricitabine-tenofovir AF  1 tablet Oral Daily  . chlorhexidine  15 mL Mouth Rinse BID  . Chlorhexidine Gluconate Cloth  6 each Topical Daily  . cloNIDine  0.2 mg Per Tube BID  . enoxaparin (LOVENOX) injection  40 mg Subcutaneous Daily  . feeding supplement (PROSource TF)  45 mL Per Tube Daily  . insulin aspart  0-20 Units Subcutaneous Q4H  . insulin aspart  4 Units Subcutaneous Q4H  . insulin detemir  18 Units Subcutaneous BID  . lacosamide  150 mg Per Tube BID  . lactulose  20 g Per Tube TID  . levETIRAcetam  1,500 mg Per Tube BID  . mouth rinse  15 mL Mouth Rinse q12n4p  . multivitamin with minerals  1 tablet Oral Daily  . mupirocin ointment   Nasal BID  . pantoprazole sodium  40 mg Per Tube QHS  . PHENObarbital  64.8 mg Per Tube BID  . QUEtiapine  200 mg Oral BID  . sertraline  50 mg Oral QHS  . sodium chloride flush  3 mL Intravenous Once  . triamterene-hydrochlorothiazide  1 tablet Oral Daily  . valproic acid  500 mg Per Tube Q8H   Continuous Infusions: . feeding supplement (OSMOLITE 1.5 CAL) 1,000 mL (08/01/20 1539)     LOS: 12 days    Time spent: 35 mins    Evita Merida, MD [facility]   If 7PM-7AM, please contact night-coverage

## 2020-08-03 NOTE — Progress Notes (Signed)
Subjective: No acute events overnight.  ROS: Unable to obtain due to poor mental status  Examination  Vital signs in last 24 hours: Temp:  [97.5 F (36.4 C)-98.5 F (36.9 C)] 97.5 F (36.4 C) (12/01 1316) Pulse Rate:  [87-94] 87 (12/01 1316) Resp:  [14-18] 14 (12/01 1316) BP: (107-142)/(55-78) 107/55 (12/01 1316) SpO2:  [98 %-100 %] 100 % (12/01 1316) Weight:  [86.4 kg] 86.4 kg (12/01 0405)  General: lying in bed, not in apparent distress CVS: pulse-normal rate and rhythm RS: breathing comfortably, CTA B Extremities: normal, warm  Neuro: MS: Alert, smiled and told me she is okay, able to tell me her name, perseverating, does not follow commands CN: PERRLA, restricted bilaterally, no gaze deviation, no apparent facial asymmetry Motor: Spontaneously moving all 4 extremities  Basic Metabolic Panel: Recent Labs  Lab 07/29/20 0220 07/29/20 0220 07/29/20 2058 07/31/20 0115 07/31/20 0115 08/01/20 0207 08/01/20 1150 08/02/20 0223  NA 144  --   --  141  --  139 140 137  K 3.0*  --   --  3.1*  --  6.5* 4.9 3.6  CL 110  --   --  103  --  104 102 99  CO2 18*  --   --  27  --  26 29 31   GLUCOSE 104*  --   --  208*  --  156* 117* 93  BUN 13  --   --  11  --  8 9 8   CREATININE 0.87  --   --  0.62  --  0.68 0.65 0.62  CALCIUM 8.6*   < >  --  8.4*   < > 8.7* 8.7* 8.4*  MG  --   --  1.8 1.8  --   --   --   --   PHOS  --   --  3.4 2.8  --   --   --   --    < > = values in this interval not displayed.    CBC: Recent Labs  Lab 07/28/20 0520 07/31/20 0115  WBC 9.6 9.2  NEUTROABS 5.6  --   HGB 16.0* 14.2  HCT 46.4* 42.1  MCV 84.7 84.7  PLT 127* 163     Coagulation Studies: No results for input(s): LABPROT, INR in the last 72 hours.  Imaging No new brain imaging overnight  ASSESSMENT AND PLAN: 59 year old female with past medical history of CVA, schizophrenia, type 2 diabetes who presented with staring and speech arrest as well as possible right-sided weakness and  neglect. EEG showed generalized periodic epileptiform discharges, maximal right frontal region.   New onset epilepsy Suspected PRES ( improving) Aphasia ( improving) Chronic strokes Acute encephalopathy,improving -Patient continues to be encephalopathic, suspected multifactorial due to hypertensive encephalopathy, ictal/postictal state in the setting of poor neurological reserve.  Recommendation -Continue Keppra 1500 mg twice daily, VPA 500 milligrams every 8 hours, Vimpat 150 mg twice daily, phenobarb 65 mg twice daily -Can increasevalproic acid if any further seizures -Continue seizure precautions - PRNIV Ativan 2 mg for clinical seizure lasting more than 2 minutes -Management of rest of comorbidities per primary team  Neurology will follow peripherally.  Please call neurology with any further questions.  I have spent a total of 25 minuteswith the patient reviewing hospitalnotes,  test results, labs and examining the patient as well as establishing an assessment and plan.>50% of time was spent in direct patient care.   Izabell Schalk Epilepsy [facility] For questions after 5pm  please refer to AMION to reach the Neurologist on call

## 2020-08-03 NOTE — TOC Progression Note (Addendum)
Transition of Care [facility]) - Progression Note    Patient Details  Name: Susan [name] MRN: [id number] Date of Birth: 1961-04-27  Transition of Care [facility]) CM/SW Bernie, [place] Phone Number: 08/03/2020, 10:35 AM  Clinical Narrative:    CSW called left message for pt daughter [name] requesting call back.   1619: CSW calls pt daughter again. She answers and is able to discuss disposition. She is okay with SNF plan. She states her mom has been to [facility] in the past and she was not a fan though that daughter has been to [facility] herself and is okay with that facility if possible.   Fl2 complete and bed requests sent in hub.   Expected Discharge Plan: Home/Self Care Barriers to Discharge: Continued Medical Work up  Expected Discharge Plan and Services Expected Discharge Plan: Home/Self Care In-house Referral: NA Discharge Planning Services: CM Consult Post Acute Care Choice: NA Living arrangements for the past 2 months: Single Family Home                 DME Arranged: N/A DME Agency: NA       HH Arranged: NA HH Agency: NA         Social Determinants of Health (SDOH) Interventions    Readmission Risk Interventions Readmission Risk Prevention Plan 03/11/2020 02/19/2019  Transportation Screening Complete Complete  Medication Review Press photographer) Referral to Pharmacy Complete  PCP or Specialist appointment within 3-5 days of discharge Complete Complete  HRI or Home Care Consult Complete Complete  SW Recovery Care/Counseling Consult Complete Complete  Palliative Care Screening Not Applicable Not [place] Complete Not Applicable  Some recent data might be hidden

## 2020-08-03 NOTE — Progress Notes (Addendum)
Nutrition Follow-up  DOCUMENTATION CODES:   Not applicable  INTERVENTION:   -Hormel Shake TID with meals, each supplement provides 520 kcals and 22 grams protein -Magic cup TID with meals, each supplement provides 290 kcal and 9 grams of protein -MVI with minerals daily -Feeding assistance with meals  NUTRITION DIAGNOSIS:   Inadequate oral intake related to lethargy/confusion, acute illness as evidenced by NPO status.  Progressing; advanced to dysphagia 2 diet with thin liquids  GOAL:   Patient will meet greater than or equal to 90% of their needs  Progressing   MONITOR:   PO intake, Supplement acceptance, Diet advancement, Labs, Weight trends, Skin, I & O's  REASON FOR ASSESSMENT:   Consult Enteral/tube feeding initiation and management  ASSESSMENT:   59 yo female admitted with acute encephalopathy secondary to PRES and seizures, pt has not been taking psych meds at home. PMH includes DM, HIV, CVA, schizoaffective disorder, bipolar disorder, hx of trach/PEG which has been removed  11/19 Admitted 11/20 Seizure 11/26- MRI concerning for PRES, NGT placed, placement verified by x-ray, TF initiated 11/30- s/p BSE- advanced to dysphagia 1 diet with thin liquids 12/1- s/p BSE- advanced to dysphagia 2 diet with thin liquids  Reviewed I/O's: +720 ml x 24 hours and -818 ml since admission  UOP: 600 ml x 24 hours  Pt pleasantly confused at time of visit. She responded to each question with "Everything is great; everything is all right" or "I love you".   Case discussed with RN and MD. Plan to hold off on cortrak replacement. Per RN, pt consumed 75% of her breakfast meal- this is the first documented intake since advancing to PO diet, as pt did not eat dinner yesterday.   Per RN, pt tolerating current diet texture well. TF is currently on hold. Case discussed with cortrak RD, who reports cortrak order has been d/c.   Reviewed wt hx; wt has been stable over the past 7  months.   Medications reviewed and include biktarvy, lactulose, vimpat, and keppra.   Labs reviewed: CBGS: 89-188 (inpatient orders for glycemic control are 0-20 units insulin aspart every 4 hours, 4 units inuslin aspart every 4 hours, and 18 units inuslin detemir daily).   NUTRITION - FOCUSED PHYSICAL EXAM:    Most Recent Value  Orbital Region No depletion  Upper Arm Region No depletion  Thoracic and Lumbar Region No depletion  Buccal Region No depletion  Temple Region No depletion  Clavicle Bone Region No depletion  Clavicle and Acromion Bone Region No depletion  Scapular Bone Region No depletion  Dorsal Hand No depletion  Patellar Region No depletion  Anterior Thigh Region No depletion  Posterior Calf Region No depletion  Edema (RD Assessment) Mild  Hair Reviewed  Eyes Reviewed  Mouth Reviewed  Skin Reviewed  Nails Reviewed       Diet Order:   Diet Order            DIET - DYS 1 Room service appropriate? Yes; Fluid consistency: Thin  Diet effective now                 EDUCATION NEEDS:   Not appropriate for education at this time  Skin:  Skin Assessment: Reviewed RN Assessment  Last BM:  08/03/20  Height:   Ht Readings from Last 1 Encounters:  07/21/20 5\' 6"  (1.676 m)    Weight:   Wt Readings from Last 1 Encounters:  08/03/20 86.4 kg    Ideal Body Weight:  59.1  kg  BMI:  Body mass index is 30.74 kg/m.  Estimated Nutritional Needs:   Kcal:  1850-2050  Protein:  105-120 grams  Fluid:  > 1.8 L    Loistine Chance, RD, LDN, [place] Registered Dietitian II Certified Diabetes Care and Education Specialist Please refer to [facility] for RD and/or RD on-call/weekend/after hours pager

## 2020-08-04 ENCOUNTER — Ambulatory Visit: Payer: Medicare Other | Admitting: Internal Medicine

## 2020-08-04 LAB — GLUCOSE, CAPILLARY
Glucose-Capillary: 104 mg/dL — ABNORMAL HIGH (ref 70–99)
Glucose-Capillary: 109 mg/dL — ABNORMAL HIGH (ref 70–99)
Glucose-Capillary: 116 mg/dL — ABNORMAL HIGH (ref 70–99)
Glucose-Capillary: 118 mg/dL — ABNORMAL HIGH (ref 70–99)
Glucose-Capillary: 167 mg/dL — ABNORMAL HIGH (ref 70–99)
Glucose-Capillary: 190 mg/dL — ABNORMAL HIGH (ref 70–99)
Glucose-Capillary: 84 mg/dL (ref 70–99)

## 2020-08-04 LAB — COMPREHENSIVE METABOLIC PANEL
ALT: 21 U/L (ref 0–44)
AST: 24 U/L (ref 15–41)
Albumin: 2.7 g/dL — ABNORMAL LOW (ref 3.5–5.0)
Alkaline Phosphatase: 78 U/L (ref 38–126)
Anion gap: 11 (ref 5–15)
BUN: 14 mg/dL (ref 6–20)
CO2: 31 mmol/L (ref 22–32)
Calcium: 8.9 mg/dL (ref 8.9–10.3)
Chloride: 97 mmol/L — ABNORMAL LOW (ref 98–111)
Creatinine, Ser: 0.78 mg/dL (ref 0.44–1.00)
GFR, Estimated: >60 mL/min (ref >60)
Glucose, Bld: 176 mg/dL — ABNORMAL HIGH (ref 70–99)
Potassium: 4.2 mmol/L (ref 3.5–5.1)
Sodium: 139 mmol/L (ref 135–145)
Total Bilirubin: 0.4 mg/dL (ref 0.3–1.2)
Total Protein: 5.8 g/dL — ABNORMAL LOW (ref 6.5–8.1)

## 2020-08-04 LAB — CBC
HCT: 41.7 % (ref 36.0–46.0)
Hemoglobin: 13.4 g/dL (ref 12.0–15.0)
MCH: 28.9 pg (ref 26.0–34.0)
MCHC: 32.1 g/dL (ref 30.0–36.0)
MCV: 89.9 fL (ref 80.0–100.0)
Platelets: 318 10*3/uL (ref 150–400)
RBC: 4.64 MIL/uL (ref 3.87–5.11)
RDW: 14.6 % (ref 11.5–15.5)
WBC: 10.1 10*3/uL (ref 4.0–10.5)
nRBC: 0 % (ref 0.0–0.2)

## 2020-08-04 LAB — PHOSPHORUS: Phosphorus: 4.5 mg/dL (ref 2.5–4.6)

## 2020-08-04 LAB — MAGNESIUM: Magnesium: 2.2 mg/dL (ref 1.7–2.4)

## 2020-08-04 MED ORDER — ADULT MULTIVITAMIN W/MINERALS CH
1.0000 | ORAL_TABLET | Freq: Every day | ORAL | Status: DC
  Administered 2020-08-05 – 2020-08-13 (×9): 1
  Filled 2020-08-04 (×9): qty 1

## 2020-08-04 MED ORDER — QUETIAPINE FUMARATE 50 MG PO TABS
200.0000 mg | ORAL_TABLET | Freq: Two times a day (BID) | ORAL | Status: DC
  Administered 2020-08-04 – 2020-08-11 (×14): 200 mg
  Filled 2020-08-04 (×14): qty 4

## 2020-08-04 MED ORDER — SERTRALINE HCL 50 MG PO TABS
50.0000 mg | ORAL_TABLET | Freq: Every day | ORAL | Status: DC
  Administered 2020-08-04 – 2020-08-10 (×7): 50 mg
  Filled 2020-08-04 (×7): qty 1

## 2020-08-04 MED ORDER — TRIAMTERENE-HCTZ 37.5-25 MG PO TABS
1.0000 | ORAL_TABLET | Freq: Every day | ORAL | Status: DC
  Administered 2020-08-06 – 2020-08-14 (×9): 1
  Filled 2020-08-04 (×10): qty 1

## 2020-08-04 NOTE — Progress Notes (Signed)
       RE:   Susan [name]      Date of Birth:  01-29-1961     Date:  08/04/20       To Whom It May Concern:  Please be advised that the above-named patient will require a short-term nursing home stay - anticipated 30 days or less for rehabilitation and strengthening.  The plan is for return home.

## 2020-08-04 NOTE — Progress Notes (Signed)
Occupational Therapy Treatment Patient Details Name: Susan [name] MRN: [id number] DOB: 04/30/1961 Today's Date: 08/04/2020    History of present illness Pt is a 59 y.o. female admitted 07/20/20 with AMS. MRI compatible with posterior reversible encephalopathy syndrome (PRES); chronic bilateral posterior MCA and right PCA/MCA distribution infarcts. EEG showed new onset status epilepticus. PMH includes schizoaffective/bipolar disorder, seizures, HTN, DM2, HIV, polysubstance abuse; of note, recent admissions with prolonged stay 11/11/19-01/09/20 for sepsis s/p trach/PEG and removal, and 03/2020 with stroke.   OT comments  Pt initially with improved alertness this session, verbalizing a few words (mumbling, incoherent), but still difficulty following commands. Able to perform sitting EOB activity, requiring up to max A. Pt  with eyes closing and less interaction requiring return to supine; requiring max hand over hand assist to attempt initiating simple grooming and UB dressing. OT will continue to follow acutely  Follow Up Recommendations  SNF;Supervision/Assistance - 24 hour    Equipment Recommendations  Other (comment) (TBD at next venue of care)    Recommendations for Other Services      Precautions / Restrictions Precautions Precautions: Fall;Other (comment) Precaution Comments: AMS with LUE soft mitt and wrist restraint; R-side hemiparesis Restrictions Weight Bearing Restrictions: No       Mobility Bed Mobility Overal bed mobility: Needs Assistance Bed Mobility: Supine to Sit;Sit to Supine     Supine to sit: Max assist Sit to supine: Max assist   General bed mobility comments: Pt  not following commands for sequencing, max A  to assist trunk elevation and scooting to EOB; max A for B LE and trunk management return to supine; max A repositioning in bed  Transfers                      Balance Overall balance assessment: Needs assistance Sitting-balance support:  Bilateral upper extremity supported;Feet supported Sitting balance-Leahy Scale: Poor Sitting balance - Comments: Difficult to assess true sitting balance; able to maintain static sitting with LUE support, but randomly losing balance in all directions requiring assist to correct, then pushing in that direction and resisting assist to correct                                   ADL either performed or assessed with clinical judgement   ADL Overall ADL's : Needs assistance/impaired     Grooming: Wash/dry hands;Wash/dry face;Maximal assistance;Cueing for sequencing;Sitting Grooming Details (indicate cue type and reason): max hand over hand A to wash face and hands with multimodal cues to initiate and to attend to R side for task Upper Body Bathing: Maximal assistance;Sitting Upper Body Bathing Details (indicate cue type and reason): max hand over hand A to wash face and hands with multimodal cues to initiate. R side unable                           General ADL Comments: max - total A with tasks due to cognition     Vision Patient Visual Report: Other (comment) (intermittent eye contact, R gaze preference) Additional Comments: Pt with eyes opened but did not maintain without verbal and physical cues   Perception     Praxis      Cognition Arousal/Alertness: Awake/alert;Lethargic Behavior During Therapy: Flat affect Overall Cognitive Status: No family/caregiver present to determine baseline cognitive functioning  General Comments: Initially alert with a few verbal responses, although not appropriate, and not following comands; increased fatigue/lethargy once sitting up with return to supine, then pt minimally responding requiring max verbal cues to awaken to name        Exercises     Shoulder Instructions       General Comments      Pertinent Vitals/ Pain       Pain Assessment: Faces Pain Score: 0-No  pain Faces Pain Scale: No hurt Pain Intervention(s): Monitored during session;Repositioned  Home Living                                          Prior Functioning/Environment              Frequency  Min 1X/week        Progress Toward Goals  OT Goals(current goals can now be found in the care plan section)  Progress towards OT goals: OT to reassess next treatment  Acute Rehab OT Goals Patient Stated Goal: none stated  Plan Discharge plan remains appropriate    Co-evaluation                 AM-PAC OT "6 Clicks" Daily Activity     Outcome Measure   Help from another person eating meals?: Total Help from another person taking care of personal grooming?: A Lot Help from another person toileting, which includes using toliet, bedpan, or urinal?: Total Help from another person bathing (including washing, rinsing, drying)?: Total Help from another person to put on and taking off regular upper body clothing?: A Lot Help from another person to put on and taking off regular lower body clothing?: Total 6 Click Score: 8    End of Session    OT Visit Diagnosis: Other abnormalities of gait and mobility (R26.89);Other symptoms and signs involving cognitive function;Low vision, both eyes (H54.2);Muscle weakness (generalized) (M62.81)   Activity Tolerance Patient limited by lethargy;Other (comment) (cognition)   Patient Left in bed;with call bell/phone within reach;with bed alarm set   Nurse Communication          Time: [phone] OT Time Calculation (min): 19 min  Charges: OT General Charges $OT Visit: 1 Visit OT Treatments $Therapeutic Activity: 8-22 mins     Britt Bottom 08/04/2020, 4:43 PM

## 2020-08-04 NOTE — Progress Notes (Signed)
PROGRESS NOTE    Susan [name]  TYO:[id number] DOB: 1961/01/30 DOA: 07/20/2020 PCP: Hoyt Koch, MD    Brief Narrative: Susan [name] is a 59 y.o. female with extensive past medical history,  schizoaffective disorder/bipolar disorder,  seizures, essential hypertension, diabetes mellitus type 2, HIV,  polysubstance abuse and history of stroke in August 2021 notable for prolonged hospitalization,  developed sepsis with respiratory failure during that time.  She had trach and PEG which is now removed,  comes into the hospital for acute encephalopathy on 07/29/2020 .  There was a concern for stroke. Patient was admitted for acute metabolic encephalopathy possibly multifactorial,  could be secondary to stroke / hypertensive urgency.  Assessment & Plan:   Active Problems:   Essential hypertension   Schizoaffective disorder, bipolar type (HCC)   GERD (gastroesophageal reflux disease)   Uncontrolled type 2 diabetes mellitus with hyperglycemia, with long-term current use of insulin (HCC)   Human immunodeficiency virus (HIV) disease (HCC)   Metabolic encephalopathy   PRES (posterior reversible encephalopathy syndrome)   Palliative care encounter   Acute metabolic encephalopathy secondary to stroke/essential hypertension: She was started on Cardene on admission, her MRI of the brain was compatible with PRES. She has been weaned off Cardene. NG tube was placed as patient was somnolent and she is currently on Norvasc, Coreg and clonidine. Her Maxide was started on 08/02/2020 and her blood pressure has been improving since then. Continue Coreg, Norvasc, clonidine and Maxide. She continues to be mildly encephalopathic, but improving.   She appears much improved,  states that she is feeling better.   New onset epilepsy/chronic stroke: Continuous EEG showed status epilepticus. She is currently on phenobarbital, Keppra, Vimpat and Depakote. Neurology on board,  no seizure  events. Continue seizure precautions. Neurology recommended increasing Keppra if there is further seizures,  continue others at same dosing. We consulted palliative care to discussed with family goals of care family, family wants to be full code and aggressive care.  Dysphagia: Patient failed swallowing evaluation. Needs repeat swallowing evaluation sometime this week to see if we can take the NG tube out, Speech recommended Dysphagia 2 Diet. Family is very reluctant and they will probably want everything done for her including a PEG tube. Palliative care has met with family , Currently with NG tube, continue tube feedings per nutrition.  History of schizophrenia/schizoaffective disorder/bipolar: Continue BuSpar, neurology recommended to restart Depakote, Seroquel, Vistaril and Zoloft.  Hold Cogentin 11/29 she had labile mood,  crying and screaming.   Psychiatry was consulted recommended to restart her Depakote Seroquel, Cogentin once more awake.  HIV/AIDS: Continue Hart therapy. Repeated CD4 count outpatient.  Uncontrolled diabetes mellitus type 2: Hb A1c of 7.7.  Blood glucose fairly controlled,   continue long-acting insulin plus sliding scale.  Hyperkalemia: It is improved , K 4.2 today Maxide was held for 24 hours.   We have resumed Maxide. Repeat a basic metabolic panel in the morning.   DVT prophylaxis: Lovenox Code Status: Full Family Communication: No family at bedside  Disposition Plan:    Status is: Inpatient  Remains inpatient appropriate because:Inpatient level of care appropriate due to severity of illness   Dispo: The patient is from: Home              Anticipated d/c is to: SNF              Anticipated d/c date is: > 3 days  Patient currently is not medically stable to d/c.   Consultants:   Neurology and psychiatry  Procedures:  None Antimicrobials:  Anti-infectives (From admission, onward)   Start     Dose/Rate Route  Frequency Ordered Stop   08/02/20 1000  bictegravir-emtricitabine-tenofovir AF (BIKTARVY) 50-200-25 MG per tablet 1 tablet        1 tablet Oral Daily 08/01/20 1114     07/31/20 1000  dolutegravir (TIVICAY) tablet 50 mg  Status:  Discontinued       "And" Linked Group Details   50 mg Per Tube Daily 07/30/20 1023 08/01/20 1121   07/31/20 1000  emtricitabine-tenofovir AF (DESCOVY) 200-25 MG per tablet 1 tablet  Status:  Discontinued       "And" Linked Group Details   1 tablet Per Tube Daily 07/30/20 1023 08/01/20 1121   07/21/20 1000  bictegravir-emtricitabine-tenofovir AF (BIKTARVY) 50-200-25 MG per tablet 1 tablet  Status:  Discontinued        1 tablet Oral Daily 07/21/20 0058 07/30/20 1023     Subjective: Patient was seen and examined at bedside.  Overnight events noted.   She was sleeping when seen,   she appears improved,  denies any concerns.     Objective: Vitals:   08/03/20 2112 08/04/20 0413 08/04/20 0420 08/04/20 1333  BP: (!) 133/93  119/63 130/62  Pulse: 89  89 91  Resp: _0 Temp: 98.1 F (36.7 C)  97.9 F (36.6 C) 98.4 F (36.9 C)  TempSrc: Oral     SpO2: 100%  99% 98%  Weight:  85.5 kg    Height:        Intake/Output Summary (Last 24 hours) at 08/04/2020 1640 Last data filed at 08/04/2020 0700 Gross per 24 hour  Intake 837 ml  Output 1700 ml  Net -863 ml   Filed Weights   08/02/20 0441 08/03/20 0405 08/04/20 0413  Weight: 84.6 kg 86.4 kg 85.5 kg    Examination:  General exam: Appears calm and comfortable, not in any acute distress. Respiratory system: Clear to auscultation. Respiratory effort normal. Cardiovascular system: S1 & S2 heard, RRR. No JVD, murmurs, rubs, gallops or clicks. No pedal edema. Gastrointestinal system: Abdomen is nondistended, soft and nontender. No organomegaly or masses felt. Normal bowel sounds heard. Central nervous system: Alert and oriented. No focal neurological deficits. Extremities: No edema, no cyanosis, no  clubbing. Skin: No rashes, lesions or ulcers Psychiatry: Judgement and insight appear normal. Mood & affect appropriate.     Data Reviewed: I have personally reviewed following labs and imaging studies  CBC: Recent Labs  Lab 07/31/20 0115 08/04/20 0327  WBC 9.2 10.1  HGB 14.2 13.4  HCT 42.1 41.7  MCV 84.7 89.9  PLT 163 919   Basic Metabolic Panel: Recent Labs  Lab 07/29/20 0220 07/29/20 2058 07/31/20 0115 08/01/20 0207 08/01/20 1150 08/02/20 0223 08/04/20 0327  NA   < >  --  141 139 140 137 139  K   < >  --  3.1* 6.5* 4.9 3.6 4.2  CL   < >  --  103 104 102 99 97*  CO2   < >  --  _1 GLUCOSE   < >  --  208* 156* 117* 93 176*  BUN   < >  --  _2 CREATININE   < >  --  0.62 0.68 0.65 0.62 0.78  CALCIUM   < >  --  8.4* 8.7* 8.7* 8.4* 8.9  MG  --  1.8 1.8  --   --   --  2.2  PHOS  --  3.4 2.8  --   --   --  4.5   < > = values in this interval not displayed.   GFR: Estimated Creatinine Clearance: 84.5 mL/min (by C-G formula based on SCr of 0.78 mg/dL). Liver Function Tests: Recent Labs  Lab 07/31/20 0115 08/01/20 0207 08/04/20 0327  AST _0 ALT _1 ALKPHOS 78 77 78  BILITOT 0.3 0.3 0.4  PROT 5.3* 5.4* 5.8*  ALBUMIN 2.8* 2.8* 2.7*   No results for input(s): LIPASE, AMYLASE in the last 168 hours. No results for input(s): AMMONIA in the last 168 hours. Coagulation Profile: No results for input(s): INR, PROTIME in the last 168 hours. Cardiac Enzymes: No results for input(s): CKTOTAL, CKMB, CKMBINDEX, TROPONINI in the last 168 hours. BNP (last 3 results) No results for input(s): PROBNP in the last 8760 hours. HbA1C: No results for input(s): HGBA1C in the last 72 hours. CBG: Recent Labs  Lab 08/04/20 0025 08/04/20 0404 08/04/20 0755 08/04/20 1118 08/04/20 1629  GLUCAP 84 190* 109* 118* 116*   Lipid Profile: No results for input(s): CHOL, HDL, LDLCALC, TRIG, CHOLHDL, LDLDIRECT in the last 72 hours. Thyroid Function  Tests: No results for input(s): TSH, T4TOTAL, FREET4, T3FREE, THYROIDAB in the last 72 hours. Anemia Panel: No results for input(s): VITAMINB12, FOLATE, FERRITIN, TIBC, IRON, RETICCTPCT in the last 72 hours. Sepsis Labs: No results for input(s): PROCALCITON, LATICACIDVEN in the last 168 hours.  No results found for this or any previous visit (from the past 240 hour(s)).   Radiology Studies: DG Abd 1 View  Result Date: 08/03/2020 CLINICAL DATA:  NG tube placement EXAM: ABDOMEN - 1 VIEW COMPARISON:  Radiograph 07/29/2020, CT 07/20/2020 FINDINGS: Imaging completely encompasses the abdomen. Transesophageal tube tip terminates near the gastric antrum/duodenal bulb with side port distal to the GE junction. Multiple air-filled loops of large and small bowel without frank distention. No suspicious calcifications. Degenerative changes in the spine and pelvis as imaged. IMPRESSION: Transesophageal tube tip terminates near the gastric antrum/duodenal bulb with side port distal to the GE junction. Multiple air-filled loops of bowel without high-grade obstruction. A mild ileus or partial obstruction is not excluded. Electronically Signed   By: Lovena Le M.D.   On: 08/03/2020 18:31    Scheduled Meds: .  stroke: mapping our early stages of recovery book   Does not apply Once  . amLODipine  10 mg Per Tube Daily  . bictegravir-emtricitabine-tenofovir AF  1 tablet Oral Daily  . chlorhexidine  15 mL Mouth Rinse BID  . Chlorhexidine Gluconate Cloth  6 each Topical Daily  . cloNIDine  0.2 mg Per Tube BID  . enoxaparin (LOVENOX) injection  40 mg Subcutaneous Daily  . feeding supplement (PROSource TF)  45 mL Per Tube Daily  . insulin aspart  0-20 Units Subcutaneous Q4H  . insulin aspart  4 Units Subcutaneous Q4H  . insulin detemir  18 Units Subcutaneous BID  . lacosamide  150 mg Per Tube BID  . lactulose  20 g Per Tube TID  . levETIRAcetam  1,500 mg Per Tube BID  . mouth rinse  15 mL Mouth Rinse q12n4p  .  [START ON 08/05/2020] multivitamin with minerals  1 tablet Per Tube Daily  . mupirocin ointment   Nasal BID  . pantoprazole sodium  40 mg Per Tube QHS  .  PHENObarbital  64.8 mg Per Tube BID  . QUEtiapine  200 mg Per Tube BID  . sertraline  50 mg Per Tube QHS  . sodium chloride flush  3 mL Intravenous Once  . [START ON 08/05/2020] triamterene-hydrochlorothiazide  1 tablet Per Tube Daily  . valproic acid  500 mg Per Tube Q8H   Continuous Infusions: . feeding supplement (OSMOLITE 1.5 CAL) 1,000 mL (08/03/20 1936)     LOS: 13 days    Time spent: 25 mins    Copper Basnett, MD [facility]   If 7PM-7AM, please contact night-coverage

## 2020-08-04 NOTE — Progress Notes (Signed)
Nutrition Follow-up  DOCUMENTATION CODES:   Not applicable  INTERVENTION:   -ContinueHormel Shake TID with meals, each supplement provides 520 kcals and 22 grams protein -Continue Magic cup TID with meals, each supplement provides 290 kcal and 9 grams of protein -Continue MVI with minerals daily -Continue feeding assistance with meals -Continue Tube Feeding via NG tube: Osmolite 1.5 at 55 ml/hr Pro-Source 45 mL daily Provides 100 g of protein, 2020 kcals and 1003 mL of free water Meets 100% estimated calorie and protein needs  NUTRITION DIAGNOSIS:   Inadequate oral intake related to lethargy/confusion, acute illness as evidenced by NPO status.  Ongoing  GOAL:   Patient will meet greater than or equal to 90% of their needs  Met with TF  MONITOR:   PO intake, Supplement acceptance, Diet advancement, Labs, Weight trends, Skin, I & O's  REASON FOR ASSESSMENT:   Consult Enteral/tube feeding initiation and management  ASSESSMENT:   59 yo female admitted with acute encephalopathy secondary to PRES and seizures, pt has not been taking psych meds at home. PMH includes DM, HIV, CVA, schizoaffective disorder, bipolar disorder, hx of trach/PEG which has been removed  11/19 Admitted 11/20 Seizure 11/26 MRI concerning for PRES, NG tube placed, TF protocol ordered 12/2- TF re-started Reviewed I/O's: -1.1 L x 24 hours and -1.9 L since admission  UOP: 2.1 L x 24 hours  Pt pleasantly confused today. She reports "I'm doing great today!".   Intake remains in consistent. Pt consumed 50-75% of meals yesterday, however, PO intake has declined today. Case discussed with RN, who reports pt is not eating well. Noted both lunch and breakfast trays have been untouched.   Pt remains with NGT and TF has been re-started secondary to poor oral intake.   Labs reviewed: CBGS: 109-190.  Diet Order:   Diet Order            DIET - DYS 1 Room service appropriate? Yes; Fluid consistency: Thin   Diet effective now                 EDUCATION NEEDS:   Not appropriate for education at this time  Skin:  Skin Assessment: Reviewed RN Assessment  Last BM:  08/03/20  Height:   Ht Readings from Last 1 Encounters:  07/21/20 5' 6"  (1.676 m)    Weight:   Wt Readings from Last 1 Encounters:  08/04/20 85.5 kg    Ideal Body Weight:  59.1 kg  BMI:  Body mass index is 30.42 kg/m.  Estimated Nutritional Needs:   Kcal:  1850-2050  Protein:  105-120 grams  Fluid:  > 1.8 L    Loistine Chance, RD, LDN, [place] Registered Dietitian II Certified Diabetes Care and Education Specialist Please refer to [facility] for RD and/or RD on-call/weekend/after hours pager

## 2020-08-05 LAB — COMPREHENSIVE METABOLIC PANEL
ALT: 20 U/L (ref 0–44)
AST: 21 U/L (ref 15–41)
Albumin: 2.8 g/dL — ABNORMAL LOW (ref 3.5–5.0)
Alkaline Phosphatase: 73 U/L (ref 38–126)
Anion gap: 10 (ref 5–15)
BUN: 12 mg/dL (ref 6–20)
CO2: 32 mmol/L (ref 22–32)
Calcium: 9.3 mg/dL (ref 8.9–10.3)
Chloride: 98 mmol/L (ref 98–111)
Creatinine, Ser: 0.77 mg/dL (ref 0.44–1.00)
GFR, Estimated: >60 mL/min (ref >60)
Glucose, Bld: 127 mg/dL — ABNORMAL HIGH (ref 70–99)
Potassium: 4 mmol/L (ref 3.5–5.1)
Sodium: 140 mmol/L (ref 135–145)
Total Bilirubin: 0.6 mg/dL (ref 0.3–1.2)
Total Protein: 6.1 g/dL — ABNORMAL LOW (ref 6.5–8.1)

## 2020-08-05 LAB — CBC
HCT: 42.4 % (ref 36.0–46.0)
Hemoglobin: 13.7 g/dL (ref 12.0–15.0)
MCH: 28.7 pg (ref 26.0–34.0)
MCHC: 32.3 g/dL (ref 30.0–36.0)
MCV: 88.7 fL (ref 80.0–100.0)
Platelets: 367 10*3/uL (ref 150–400)
RBC: 4.78 MIL/uL (ref 3.87–5.11)
RDW: 14.2 % (ref 11.5–15.5)
WBC: 7.5 10*3/uL (ref 4.0–10.5)
nRBC: 0 % (ref 0.0–0.2)

## 2020-08-05 LAB — GLUCOSE, CAPILLARY
Glucose-Capillary: 120 mg/dL — ABNORMAL HIGH (ref 70–99)
Glucose-Capillary: 155 mg/dL — ABNORMAL HIGH (ref 70–99)
Glucose-Capillary: 78 mg/dL (ref 70–99)
Glucose-Capillary: 98 mg/dL (ref 70–99)
Glucose-Capillary: 190 mg/dL — ABNORMAL HIGH (ref 70–99)
Glucose-Capillary: 112 mg/dL — ABNORMAL HIGH (ref 70–99)

## 2020-08-05 LAB — MAGNESIUM: Magnesium: 2.2 mg/dL (ref 1.7–2.4)

## 2020-08-05 LAB — PHOSPHORUS: Phosphorus: 3.8 mg/dL (ref 2.5–4.6)

## 2020-08-05 MED ORDER — INSULIN DETEMIR 100 UNIT/ML ~~LOC~~ SOLN
15.0000 [IU] | Freq: Two times a day (BID) | SUBCUTANEOUS | Status: DC
  Administered 2020-08-05 – 2020-08-14 (×18): 15 [IU] via SUBCUTANEOUS
  Filled 2020-08-05 (×21): qty 0.15
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
## 2020-08-05 NOTE — Consult Note (Signed)
   [facility] CM Inpatient Consult   08/05/2020  CURLIE SITTNER [birth year] [id number]  [facility] CM Follow Up:  Per chart review, [facility] CM outpatient care coordinator has closed case due to length of current inpatient admission (greater than 10 days). Per review, current recommendation is for skilled nursing facility. No [facility] CM needs at this time.  Of note, [facility] Care Management services does not replace or interfere with any services that are arranged by inpatient case management or social work.  Netta Cedars, MSN, [place] [facility] Liaison Nurse Mobile Phone [phone]  Toll free office [phone]

## 2020-08-05 NOTE — Progress Notes (Signed)
Pt has no bed offers  Documents uploaded to [email or web address]

## 2020-08-05 NOTE — Progress Notes (Signed)
PROGRESS NOTE    Susan [name]  RSW:[id number] DOB: December 06, 1960 DOA: 07/20/2020 PCP: Hoyt Koch, MD    Brief Narrative: Susan [name] is a 59 y.o. female with extensive past medical history,  schizoaffective disorder/bipolar disorder, seizures, essential hypertension, diabetes mellitus type 2, HIV,  polysubstance abuse and history of stroke in August 2021 notable for prolonged hospitalization,  developed sepsis with respiratory failure during that time.  She had trach and PEG which is now removed,  comes into the hospital for acute encephalopathy on 07/29/2020 .  There was a concern for stroke. Patient was admitted for acute metabolic encephalopathy possibly multifactorial,  could be secondary to seizures / hypertensive urgency.  Assessment & Plan:   Active Problems:   Essential hypertension   Schizoaffective disorder, bipolar type (HCC)   GERD (gastroesophageal reflux disease)   Uncontrolled type 2 diabetes mellitus with hyperglycemia, with long-term current use of insulin (HCC)   Human immunodeficiency virus (HIV) disease (HCC)   Metabolic encephalopathy   PRES (posterior reversible encephalopathy syndrome)   Palliative care encounter   Acute metabolic encephalopathy secondary to seizures /essential hypertension: She was started on Cardene on admission, her MRI of the brain was compatible with PRES. She has been weaned off Cardene. NG tube was placed as patient was somnolent and she is currently on Norvasc, Coreg and clonidine. Her Maxide was started on 08/02/2020 and her blood pressure has been improving since then. Continue Coreg, Norvasc, clonidine and Maxide. She continues to be mildly encephalopathic, but improving.   She appears much improved,  states that she is feeling better.   New onset epilepsy/chronic stroke: Continuous EEG showed status epilepticus. She is currently on phenobarbital, Keppra, Vimpat and Depakote. Neurology on board,  no seizure  events. Continue seizure precautions. Neurology recommended increasing Keppra if there is further seizures,  continue others at same dosing. We consulted palliative care to discussed with family goals of care family, family wants to be full code and aggressive care.  Dysphagia: Patient failed swallowing evaluation. Needs repeat swallowing evaluation sometime this week to see if we can take the NG tube out, Speech recommended Dysphagia 2 Diet. Family is very reluctant and they will probably want everything done for her including a PEG tube. Palliative care has met with family , Currently with NG tube, continue tube feedings per nutrition.  History of schizophrenia/schizoaffective disorder/bipolar: Continue BuSpar, neurology recommended to restart Depakote, Seroquel, Vistaril and Zoloft.  Hold Cogentin 11/29 she had labile mood,  crying and screaming.   Psychiatry was consulted recommended to restart her Depakote Seroquel, Cogentin once more awake.  HIV/AIDS: Continue Hart therapy. Repeated CD4 count outpatient.  Uncontrolled diabetes mellitus type 2: Hb A1c of 7.7.  Blood glucose fairly controlled,   continue long-acting insulin plus sliding scale.  Hyperkalemia: It is improved , K 4.0 today Maxide was held for 24 hours.   We have resumed Maxide. Repeat a basic metabolic panel in the morning.   DVT prophylaxis: Lovenox Code Status: Full Family Communication: No family at bedside  Disposition Plan:    Status is: Inpatient  Remains inpatient appropriate because:Inpatient level of care appropriate due to severity of illness   Dispo: The patient is from: Home              Anticipated d/c is to: SNF              Anticipated d/c date is: > 3 days  Patient currently is not medically stable to d/c.   Consultants:   Neurology and psychiatry  Procedures:  None Antimicrobials:  Anti-infectives (From admission, onward)   Start     Dose/Rate Route  Frequency Ordered Stop   08/02/20 1000  bictegravir-emtricitabine-tenofovir AF (BIKTARVY) 50-200-25 MG per tablet 1 tablet        1 tablet Oral Daily 08/01/20 1114     07/31/20 1000  dolutegravir (TIVICAY) tablet 50 mg  Status:  Discontinued       "And" Linked Group Details   50 mg Per Tube Daily 07/30/20 1023 08/01/20 1121   07/31/20 1000  emtricitabine-tenofovir AF (DESCOVY) 200-25 MG per tablet 1 tablet  Status:  Discontinued       "And" Linked Group Details   1 tablet Per Tube Daily 07/30/20 1023 08/01/20 1121   07/21/20 1000  bictegravir-emtricitabine-tenofovir AF (BIKTARVY) 50-200-25 MG per tablet 1 tablet  Status:  Discontinued        1 tablet Oral Daily 07/21/20 0058 07/30/20 1023     Subjective: Patient was seen and examined at bedside.  Overnight events noted.   She reports she is feeling  better and wants to go home.  She is alert and oriented x2. She has NG tube.   Objective: Vitals:   08/04/20 1333 08/04/20 1945 08/05/20 0510 08/05/20 1506  BP: 130/62 126/61 103/66 113/72  Pulse: 91 90 91 85  Resp: 18 18 18 18   Temp: 98.4 F (36.9 C) 98.2 F (36.8 C) 98.4 F (36.9 C) 98.9 F (37.2 C)  TempSrc:  Oral Oral   SpO2: 98% 99% 99% 98%  Weight:      Height:        Intake/Output Summary (Last 24 hours) at 08/05/2020 1532 Last data filed at 08/04/2020 2300 Gross per 24 hour  Intake 30 ml  Output --  Net 30 ml   Filed Weights   08/02/20 0441 08/03/20 0405 08/04/20 0413  Weight: 84.6 kg 86.4 kg 85.5 kg    Examination:  General exam: Appears calm and comfortable, not in any acute distress. Respiratory system: Clear to auscultation. Respiratory effort normal. Cardiovascular system: S1 & S2 heard, RRR. No JVD, murmurs, rubs, gallops or clicks. No pedal edema. Gastrointestinal system: Abdomen is nondistended, soft and nontender. No organomegaly or masses felt. Normal bowel sounds heard. Central nervous system: Alert and oriented x2. No focal neurological  deficits. Extremities: No edema, no cyanosis, no clubbing. Skin: No rashes, lesions or ulcers Psychiatry: Judgement and insight appear normal. Mood & affect appropriate.     Data Reviewed: I have personally reviewed following labs and imaging studies  CBC: Recent Labs  Lab 07/31/20 0115 08/04/20 0327 08/05/20 0110  WBC 9.2 10.1 7.5  HGB 14.2 13.4 13.7  HCT 42.1 41.7 42.4  MCV 84.7 89.9 88.7  PLT 163 318 275   Basic Metabolic Panel: Recent Labs  Lab 07/29/20 2058 07/31/20 0115 07/31/20 0115 08/01/20 0207 08/01/20 1150 08/02/20 0223 08/04/20 0327 08/05/20 0110  NA  --  141   < > 139 140 137 139 140  K  --  3.1*   < > 6.5* 4.9 3.6 4.2 4.0  CL  --  103   < > 104 102 99 97* 98  CO2  --  27   < > 26 29 31 31  32  GLUCOSE  --  208*   < > 156* 117* 93 176* 127*  BUN  --  11   < > 8 9 8  14 12  CREATININE  --  0.62   < > 0.68 0.65 0.62 0.78 0.77  CALCIUM  --  8.4*   < > 8.7* 8.7* 8.4* 8.9 9.3  MG 1.8 1.8  --   --   --   --  2.2 2.2  PHOS 3.4 2.8  --   --   --   --  4.5 3.8   < > = values in this interval not displayed.   GFR: Estimated Creatinine Clearance: 84.5 mL/min (by C-G formula based on SCr of 0.77 mg/dL). Liver Function Tests: Recent Labs  Lab 07/31/20 0115 08/01/20 0207 08/04/20 0327 08/05/20 0110  AST 23 26 24 21   ALT 19 21 21 20   ALKPHOS 78 77 78 73  BILITOT 0.3 0.3 0.4 0.6  PROT 5.3* 5.4* 5.8* 6.1*  ALBUMIN 2.8* 2.8* 2.7* 2.8*   No results for input(s): LIPASE, AMYLASE in the last 168 hours. No results for input(s): AMMONIA in the last 168 hours. Coagulation Profile: No results for input(s): INR, PROTIME in the last 168 hours. Cardiac Enzymes: No results for input(s): CKTOTAL, CKMB, CKMBINDEX, TROPONINI in the last 168 hours. BNP (last 3 results) No results for input(s): PROBNP in the last 8760 hours. HbA1C: No results for input(s): HGBA1C in the last 72 hours. CBG: Recent Labs  Lab 08/04/20 1948 08/04/20 2334 08/05/20 0305 08/05/20 0739  08/05/20 1114  GLUCAP 104* 167* 120* 155* 78   Lipid Profile: No results for input(s): CHOL, HDL, LDLCALC, TRIG, CHOLHDL, LDLDIRECT in the last 72 hours. Thyroid Function Tests: No results for input(s): TSH, T4TOTAL, FREET4, T3FREE, THYROIDAB in the last 72 hours. Anemia Panel: No results for input(s): VITAMINB12, FOLATE, FERRITIN, TIBC, IRON, RETICCTPCT in the last 72 hours. Sepsis Labs: No results for input(s): PROCALCITON, LATICACIDVEN in the last 168 hours.  No results found for this or any previous visit (from the past 240 hour(s)).   Radiology Studies: DG Abd 1 View  Result Date: 08/03/2020 CLINICAL DATA:  NG tube placement EXAM: ABDOMEN - 1 VIEW COMPARISON:  Radiograph 07/29/2020, CT 07/20/2020 FINDINGS: Imaging completely encompasses the abdomen. Transesophageal tube tip terminates near the gastric antrum/duodenal bulb with side port distal to the GE junction. Multiple air-filled loops of large and small bowel without frank distention. No suspicious calcifications. Degenerative changes in the spine and pelvis as imaged. IMPRESSION: Transesophageal tube tip terminates near the gastric antrum/duodenal bulb with side port distal to the GE junction. Multiple air-filled loops of bowel without high-grade obstruction. A mild ileus or partial obstruction is not excluded. Electronically Signed   By: Lovena Le M.D.   On: 08/03/2020 18:31    Scheduled Meds: .  stroke: mapping our early stages of recovery book   Does not apply Once  . amLODipine  10 mg Per Tube Daily  . bictegravir-emtricitabine-tenofovir AF  1 tablet Oral Daily  . chlorhexidine  15 mL Mouth Rinse BID  . Chlorhexidine Gluconate Cloth  6 each Topical Daily  . cloNIDine  0.2 mg Per Tube BID  . enoxaparin (LOVENOX) injection  40 mg Subcutaneous Daily  . feeding supplement (PROSource TF)  45 mL Per Tube Daily  . insulin aspart  0-20 Units Subcutaneous Q4H  . insulin aspart  4 Units Subcutaneous Q4H  . insulin detemir  18  Units Subcutaneous BID  . lacosamide  150 mg Per Tube BID  . lactulose  20 g Per Tube TID  . levETIRAcetam  1,500 mg Per Tube BID  . mouth rinse  15 mL Mouth Rinse q12n4p  . multivitamin with minerals  1 tablet Per Tube Daily  . mupirocin ointment   Nasal BID  . pantoprazole sodium  40 mg Per Tube QHS  . PHENObarbital  64.8 mg Per Tube BID  . QUEtiapine  200 mg Per Tube BID  . sertraline  50 mg Per Tube QHS  . sodium chloride flush  3 mL Intravenous Once  . triamterene-hydrochlorothiazide  1 tablet Per Tube Daily  . valproic acid  500 mg Per Tube Q8H   Continuous Infusions: . feeding supplement (OSMOLITE 1.5 CAL) 1,000 mL (08/03/20 1936)     LOS: 14 days    Time spent: 25 mins    Dyanne Yorks, MD [facility]   If 7PM-7AM, please contact night-coverage
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
## 2020-08-06 LAB — GLUCOSE, CAPILLARY
Glucose-Capillary: 161 mg/dL — ABNORMAL HIGH (ref 70–99)
Glucose-Capillary: 114 mg/dL — ABNORMAL HIGH (ref 70–99)
Glucose-Capillary: 187 mg/dL — ABNORMAL HIGH (ref 70–99)
Glucose-Capillary: 128 mg/dL — ABNORMAL HIGH (ref 70–99)
Glucose-Capillary: 118 mg/dL — ABNORMAL HIGH (ref 70–99)
Glucose-Capillary: 139 mg/dL — ABNORMAL HIGH (ref 70–99)

## 2020-08-06 NOTE — Progress Notes (Signed)
PROGRESS NOTE    Susan [name]  YQI:[id number] DOB: 08-Mar-1961 DOA: 07/20/2020 PCP: Hoyt Koch, MD    Brief Narrative: Susan [name] is a 59 y.o. female with extensive past medical history,  schizoaffective disorder/bipolar disorder, seizures, essential hypertension, diabetes mellitus type 2, HIV,  polysubstance abuse and history of stroke in August 2021 notable for prolonged hospitalization,  developed sepsis with respiratory failure during that time.  She had trach and PEG which is now removed,  comes into the hospital for acute encephalopathy on 07/29/2020 .  There was a concern for stroke. Patient was admitted for acute metabolic encephalopathy possibly multifactorial,  could be secondary to seizures / hypertensive urgency.  Assessment & Plan:   Active Problems:   Essential hypertension   Schizoaffective disorder, bipolar type (HCC)   GERD (gastroesophageal reflux disease)   Uncontrolled type 2 diabetes mellitus with hyperglycemia, with long-term current use of insulin (HCC)   Human immunodeficiency virus (HIV) disease (HCC)   Metabolic encephalopathy   PRES (posterior reversible encephalopathy syndrome)   Palliative care encounter   Acute metabolic encephalopathy secondary to seizures /essential hypertension: She was started on Cardene on admission, her MRI of the brain was compatible with PRES. She has been weaned off Cardene. NG tube was placed as patient was somnolent and she is currently on Norvasc, Coreg and clonidine. Her Maxide was started on 08/02/2020 and her blood pressure has been improving since then. Continue Coreg, Norvasc, clonidine and Maxide. She continues to be mildly encephalopathic, but improving.   She appears much improved,  states that she is feeling better.  New onset epilepsy/chronic stroke: Continuous EEG showed status epilepticus. She is currently on phenobarbital, Keppra, Vimpat and Depakote. Neurology on board,  no seizure  events. Continue seizure precautions. Neurology recommended increasing Keppra if there is further seizures,  continue others at same dosing. We consulted palliative care to discussed with family goals of care family, family wants to be full code and aggressive care.  Dysphagia: Patient failed swallowing evaluation. Needs repeat swallowing evaluation sometime this week to see if we can take the NG tube out, Speech recommended Dysphagia 2 Diet. Family is very reluctant and they will probably want everything done for her including a PEG tube. Palliative care has met with family , Currently with NG tube, continue tube feedings per nutrition.  History of schizophrenia/schizoaffective disorder/bipolar: Continue BuSpar, neurology recommended to restart Depakote, Seroquel, Vistaril and Zoloft.  Hold Cogentin 11/29 she had labile mood,  crying and screaming.   Psychiatry was consulted recommended to restart her Depakote Seroquel, Cogentin once more awake.  HIV/AIDS: Continue Hart therapy. Repeated CD4 count outpatient.  Uncontrolled diabetes mellitus type 2: Hb A1c of 7.7.  Blood glucose fairly controlled,   continue long-acting insulin plus sliding scale.  Hyperkalemia: It is improved , K 4.0 today Maxide was held for 24 hours.   We have resumed Maxide. Repeat a basic metabolic panel in the morning.   DVT prophylaxis: Lovenox Code Status: Full Family Communication: No family at bedside  Disposition Plan:    Status is: Inpatient  Remains inpatient appropriate because:Inpatient level of care appropriate due to severity of illness   Dispo: The patient is from: Home              Anticipated d/c is to: SNF              Anticipated d/c date is: > 3 days  Patient currently is not medically stable to d/c.   Consultants:   Neurology and psychiatry  Procedures:  None Antimicrobials:  Anti-infectives (From admission, onward)   Start     Dose/Rate Route  Frequency Ordered Stop   08/02/20 1000  bictegravir-emtricitabine-tenofovir AF (BIKTARVY) 50-200-25 MG per tablet 1 tablet        1 tablet Oral Daily 08/01/20 1114     07/31/20 1000  dolutegravir (TIVICAY) tablet 50 mg  Status:  Discontinued       "And" Linked Group Details   50 mg Per Tube Daily 07/30/20 1023 08/01/20 1121   07/31/20 1000  emtricitabine-tenofovir AF (DESCOVY) 200-25 MG per tablet 1 tablet  Status:  Discontinued       "And" Linked Group Details   1 tablet Per Tube Daily 07/30/20 1023 08/01/20 1121   07/21/20 1000  bictegravir-emtricitabine-tenofovir AF (BIKTARVY) 50-200-25 MG per tablet 1 tablet  Status:  Discontinued        1 tablet Oral Daily 07/21/20 0058 07/30/20 1023     Subjective: Patient was seen and examined at bedside.  Overnight events noted.   She reports seeing babies, denies any suicidal or homicidal ideations. She has NG tube.   Objective: Vitals:   08/05/20 2027 08/06/20 0112 08/06/20 0300 08/06/20 0956  BP: 115/71 114/73 116/74 127/87  Pulse: 85 84 85 84  Resp: _0 Temp: 98.4 F (36.9 C) 98.2 F (36.8 C) 98.3 F (36.8 C) 98 F (36.7 C)  TempSrc: Oral Oral Oral Axillary  SpO2: 98% 98% 98% 100%  Weight:      Height:        Intake/Output Summary (Last 24 hours) at 08/06/2020 1521 Last data filed at 08/06/2020 0900 Gross per 24 hour  Intake 0 ml  Output --  Net 0 ml   Filed Weights   08/02/20 0441 08/03/20 0405 08/04/20 0413  Weight: 84.6 kg 86.4 kg 85.5 kg    Examination:  General exam: Appears calm and comfortable, not in any acute distress. Respiratory system: Clear to auscultation. Respiratory effort normal. Cardiovascular system: S1 & S2 heard, RRR. No JVD, murmurs, rubs, gallops or clicks. No pedal edema. Gastrointestinal system: Abdomen is nondistended, soft and nontender. No organomegaly or masses felt. Normal bowel sounds heard. Central nervous system: Alert and oriented x 2. No focal neurological  deficits. Extremities: No edema, no cyanosis, no clubbing. Skin: No rashes, lesions or ulcers Psychiatry: Judgement and insight appear normal. Mood & affect appropriate.     Data Reviewed: I have personally reviewed following labs and imaging studies  CBC: Recent Labs  Lab 07/31/20 0115 08/04/20 0327 08/05/20 0110  WBC 9.2 10.1 7.5  HGB 14.2 13.4 13.7  HCT 42.1 41.7 42.4  MCV 84.7 89.9 88.7  PLT 163 318 500   Basic Metabolic Panel: Recent Labs  Lab 07/31/20 0115 07/31/20 0115 08/01/20 0207 08/01/20 1150 08/02/20 0223 08/04/20 0327 08/05/20 0110  NA 141   < > 139 140 137 139 140  K 3.1*   < > 6.5* 4.9 3.6 4.2 4.0  CL 103   < > 104 102 99 97* 98  CO2 27   < > _1 32  GLUCOSE 208*   < > 156* 117* 93 176* 127*  BUN 11   < > _2 CREATININE 0.62   < > 0.68 0.65 0.62 0.78 0.77  CALCIUM 8.4*   < > 8.7* 8.7* 8.4* 8.9 9.3  MG  1.8  --   --   --   --  2.2 2.2  PHOS 2.8  --   --   --   --  4.5 3.8   < > = values in this interval not displayed.   GFR: Estimated Creatinine Clearance: 84.5 mL/min (by C-G formula based on SCr of 0.77 mg/dL). Liver Function Tests: Recent Labs  Lab 07/31/20 0115 08/01/20 0207 08/04/20 0327 08/05/20 0110  AST _0 ALT _1 ALKPHOS 78 77 78 73  BILITOT 0.3 0.3 0.4 0.6  PROT 5.3* 5.4* 5.8* 6.1*  ALBUMIN 2.8* 2.8* 2.7* 2.8*   No results for input(s): LIPASE, AMYLASE in the last 168 hours. No results for input(s): AMMONIA in the last 168 hours. Coagulation Profile: No results for input(s): INR, PROTIME in the last 168 hours. Cardiac Enzymes: No results for input(s): CKTOTAL, CKMB, CKMBINDEX, TROPONINI in the last 168 hours. BNP (last 3 results) No results for input(s): PROBNP in the last 8760 hours. HbA1C: No results for input(s): HGBA1C in the last 72 hours. CBG: Recent Labs  Lab 08/05/20 2026 08/05/20 2320 08/06/20 0307 08/06/20 0806 08/06/20 1205  GLUCAP 190* 112* 161* 114* 187*   Lipid  Profile: No results for input(s): CHOL, HDL, LDLCALC, TRIG, CHOLHDL, LDLDIRECT in the last 72 hours. Thyroid Function Tests: No results for input(s): TSH, T4TOTAL, FREET4, T3FREE, THYROIDAB in the last 72 hours. Anemia Panel: No results for input(s): VITAMINB12, FOLATE, FERRITIN, TIBC, IRON, RETICCTPCT in the last 72 hours. Sepsis Labs: No results for input(s): PROCALCITON, LATICACIDVEN in the last 168 hours.  No results found for this or any previous visit (from the past 240 hour(s)).   Radiology Studies: No results found.  Scheduled Meds: .  stroke: mapping our early stages of recovery book   Does not apply Once  . amLODipine  10 mg Per Tube Daily  . bictegravir-emtricitabine-tenofovir AF  1 tablet Oral Daily  . chlorhexidine  15 mL Mouth Rinse BID  . Chlorhexidine Gluconate Cloth  6 each Topical Daily  . cloNIDine  0.2 mg Per Tube BID  . enoxaparin (LOVENOX) injection  40 mg Subcutaneous Daily  . feeding supplement (PROSource TF)  45 mL Per Tube Daily  . insulin aspart  0-20 Units Subcutaneous Q4H  . insulin detemir  15 Units Subcutaneous BID  . lacosamide  150 mg Per Tube BID  . lactulose  20 g Per Tube TID  . levETIRAcetam  1,500 mg Per Tube BID  . mouth rinse  15 mL Mouth Rinse q12n4p  . multivitamin with minerals  1 tablet Per Tube Daily  . mupirocin ointment   Nasal BID  . pantoprazole sodium  40 mg Per Tube QHS  . PHENObarbital  64.8 mg Per Tube BID  . QUEtiapine  200 mg Per Tube BID  . sertraline  50 mg Per Tube QHS  . sodium chloride flush  3 mL Intravenous Once  . triamterene-hydrochlorothiazide  1 tablet Per Tube Daily  . valproic acid  500 mg Per Tube Q8H   Continuous Infusions: . feeding supplement (OSMOLITE 1.5 CAL) 1,000 mL (08/03/20 1936)     LOS: 15 days    Time spent: 25 mins    Jerelle Virden, MD [facility]   If 7PM-7AM, please contact night-coverage

## 2020-08-06 NOTE — TOC Progression Note (Signed)
Transition of Care [facility]) - Progression Note    Patient Details  Name: Susan [name] MRN: [id number] Date of Birth: 03-13-1961  Transition of Care [facility]) CM/SW [place], LCSW Phone Number:[phone] 08/06/2020, 2:45 PM  Clinical Narrative:     Patient currently does not have any bed offers.  TOC team will continue to assist with discharge planning needs.  Expected Discharge Plan: Home/Self Care Barriers to Discharge: Continued Medical Work up  Expected Discharge Plan and Services Expected Discharge Plan: Home/Self Care In-house Referral: NA Discharge Planning Services: CM Consult Post Acute Care Choice: NA Living arrangements for the past 2 months: Single Family Home                 DME Arranged: N/A DME Agency: NA       HH Arranged: NA HH Agency: NA         Social Determinants of Health (SDOH) Interventions    Readmission Risk Interventions Readmission Risk Prevention Plan 03/11/2020 02/19/2019  Transportation Screening Complete Complete  Medication Review Press photographer) Referral to Pharmacy Complete  PCP or Specialist appointment within 3-5 days of discharge Complete Complete  HRI or Home Care Consult Complete Complete  SW Recovery Care/Counseling Consult Complete Complete  Palliative Care Screening Not Applicable Not [place] Complete Not Applicable  Some recent data might be hidden

## 2020-08-07 LAB — GLUCOSE, CAPILLARY
Glucose-Capillary: 143 mg/dL — ABNORMAL HIGH (ref 70–99)
Glucose-Capillary: 114 mg/dL — ABNORMAL HIGH (ref 70–99)
Glucose-Capillary: 135 mg/dL — ABNORMAL HIGH (ref 70–99)
Glucose-Capillary: 165 mg/dL — ABNORMAL HIGH (ref 70–99)
Glucose-Capillary: 111 mg/dL — ABNORMAL HIGH (ref 70–99)

## 2020-08-07 MED ORDER — ASENAPINE MALEATE 5 MG SL SUBL: 10.0000 mg | SUBLINGUAL_TABLET | Freq: Two times a day (BID) | SUBLINGUAL | Status: DC | PRN

## 2020-08-07 MED ORDER — ASENAPINE MALEATE 2.5 MG SL SUBL
5.0000 mg | SUBLINGUAL_TABLET | Freq: Two times a day (BID) | SUBLINGUAL | Status: DC | PRN
  Administered 2020-08-07 – 2020-08-17 (×12): 5 mg via SUBLINGUAL
  Filled 2020-08-07 (×8): qty 1
  Filled 2020-08-07: qty 2
  Filled 2020-08-07: qty 1
  Filled 2020-08-07: qty 2
  Filled 2020-08-07: qty 1
  Filled 2020-08-07 (×2): qty 2
  Filled 2020-08-07 (×3): qty 1

## 2020-08-07 NOTE — Progress Notes (Signed)
Sublingual asenapine helped pt relax for most of the day. Pt was still arousable and pleasantly confused. She did have a 30 minute span crying over her children.

## 2020-08-07 NOTE — Consult Note (Signed)
Patient seen in her room but uncooperative with psychiatric assessment/evaluation at this time. She is noted to be agitated, yelling,  screaming loudly at the provider to get out of her room.  Recommendations: D/W staff nurse -D/C Haldol -Consider Saphris 5 mg twice daily as needed for agitation/psychosis -Re-consult psychiatric service as needed.  Corena Pilgrim, MD Attending psychiatrist.

## 2020-08-07 NOTE — Progress Notes (Signed)
PROGRESS NOTE    Susan [name]  PQD:[id number] DOB: [birth year] DOA: 07/20/2020 PCP: Hoyt Koch, MD    Brief Narrative: Susan [name] is a 59 y.o. female with extensive past medical history,  schizoaffective disorder/bipolar disorder, seizures, essential hypertension, diabetes mellitus type 2, HIV,  polysubstance abuse and history of stroke in August 2021 notable for prolonged hospitalization,  developed sepsis with respiratory failure during that time.  She had trach and PEG which is now removed,  comes into the hospital for acute encephalopathy on 07/29/2020 .  There was a concern for stroke. Patient was admitted for acute metabolic encephalopathy possibly multifactorial,  could be secondary to seizures / hypertensive urgency.  Assessment & Plan:   Active Problems:   Essential hypertension   Schizoaffective disorder, bipolar type (HCC)   GERD (gastroesophageal reflux disease)   Uncontrolled type 2 diabetes mellitus with hyperglycemia, with long-term current use of insulin (HCC)   Human immunodeficiency virus (HIV) disease (HCC)   Metabolic encephalopathy   PRES (posterior reversible encephalopathy syndrome)   Palliative care encounter   Acute metabolic encephalopathy secondary to seizures /essential hypertension: She was started on Cardene on admission, her MRI of the brain was compatible with PRES. She has been weaned off Cardene. NG tube was placed as patient was somnolent and she is currently on Norvasc, Coreg and clonidine. Her Maxide was started on 08/02/2020 and her blood pressure has been improving since then. Continue Coreg, Norvasc, clonidine and Maxide. She continues to be mildly encephalopathic, but improving.   She appears much improved,  states that she is feeling better.  New onset epilepsy/chronic stroke: Continuous EEG showed status epilepticus. She is currently on phenobarbital, Keppra, Vimpat and Depakote. Neurology on board,  no seizure  events. Continue seizure precautions. Neurology recommended increasing Keppra if there is further seizures,  continue others at same dosing. We consulted palliative care to discussed with family goals of care family, family wants to be full code and aggressive care.  Dysphagia: Patient failed swallowing evaluation. Needs repeat swallowing evaluation sometime this week to see if we can take the NG tube out, Speech recommended Dysphagia 2 Diet. Family is very reluctant and they will probably want everything done for her including a PEG tube. Palliative care has met with family , Currently with NG tube, continue tube feedings per nutrition.  History of schizophrenia/schizoaffective disorder/bipolar: Continue BuSpar, neurology recommended to restart Depakote, Seroquel, Vistaril and Zoloft.  Hold Cogentin 11/29 she had labile mood,  crying and screaming.   Psychiatry was consulted recommended to restart her Depakote Seroquel, Cogentin once more awake. Patient was agitated restless and yelling at the stopped having hallucinations. Psychiatry reconsulted recommended discontinue Haldol and start saphris twice daily  HIV/AIDS: Continue Hart therapy. Repeated CD4 count outpatient.  Uncontrolled diabetes mellitus type 2: Hb A1c of 7.7.  Blood glucose fairly controlled,   continue long-acting insulin plus sliding scale.  Hyperkalemia: It is improved , K 4.0  Maxide was held for 24 hours.   We have resumed Maxide. Repeat a basic metabolic panel in the morning.   DVT prophylaxis: Lovenox Code Status: Full Family Communication: Spoke with daughter at phone  Disposition Plan:    Status is: Inpatient  Remains inpatient appropriate because:Inpatient level of care appropriate due to severity of illness   Dispo: The patient is from: Home              Anticipated d/c is to: SNF  Anticipated d/c date is: > 3 days              Patient currently is not medically stable to  d/c.   Consultants:   Neurology and psychiatry  Procedures:  None Antimicrobials:  Anti-infectives (From admission, onward)   Start     Dose/Rate Route Frequency Ordered Stop   08/02/20 1000  bictegravir-emtricitabine-tenofovir AF (BIKTARVY) 50-200-25 MG per tablet 1 tablet        1 tablet Oral Daily 08/01/20 1114     07/31/20 1000  dolutegravir (TIVICAY) tablet 50 mg  Status:  Discontinued       "And" Linked Group Details   50 mg Per Tube Daily 07/30/20 1023 08/01/20 1121   07/31/20 1000  emtricitabine-tenofovir AF (DESCOVY) 200-25 MG per tablet 1 tablet  Status:  Discontinued       "And" Linked Group Details   1 tablet Per Tube Daily 07/30/20 1023 08/01/20 1121   07/21/20 1000  bictegravir-emtricitabine-tenofovir AF (BIKTARVY) 50-200-25 MG per tablet 1 tablet  Status:  Discontinued        1 tablet Oral Daily 07/21/20 0058 07/30/20 1023     Subjective: Patient was seen and examined at bedside.  Overnight events noted.   Patient was restless and agitated, She reports seeing babies. She has NG tube.    Objective: Vitals:   08/07/20 0058 08/07/20 0300 08/07/20 0451 08/07/20 0745  BP: 126/83  122/86 (!) 150/86  Pulse: 80  76 80  Resp: _0 Temp: 98.1 F (36.7 C) 98.4 F (36.9 C) 98 F (36.7 C) 98.9 F (37.2 C)  TempSrc: Oral Oral Oral Oral  SpO2: 100%  100% 100%  Weight:      Height:        Intake/Output Summary (Last 24 hours) at 08/07/2020 1427 Last data filed at 08/07/2020 0659 Gross per 24 hour  Intake 770 ml  Output 500 ml  Net 270 ml   Filed Weights   08/02/20 0441 08/03/20 0405 08/04/20 0413  Weight: 84.6 kg 86.4 kg 85.5 kg    Examination:  General exam: Appears calm and comfortable, not in any acute distress. Respiratory system: Clear to auscultation. Respiratory effort normal. Cardiovascular system: S1 & S2 heard, RRR. No JVD, murmurs, rubs, gallops or clicks. No pedal edema. Gastrointestinal system: Abdomen is nondistended, soft and nontender.  No organomegaly or masses felt. Normal bowel sounds heard. Central nervous system: Alert and oriented x 1. No focal neurological deficits. Extremities: No edema, no cyanosis, no clubbing. Skin: No rashes, lesions or ulcers Psychiatry: Hallucinations ++, denies suicidal homicidal ideations.    Data Reviewed: I have personally reviewed following labs and imaging studies  CBC: Recent Labs  Lab 08/04/20 0327 08/05/20 0110  WBC 10.1 7.5  HGB 13.4 13.7  HCT 41.7 42.4  MCV 89.9 88.7  PLT 318 098   Basic Metabolic Panel: Recent Labs  Lab 08/01/20 0207 08/01/20 1150 08/02/20 0223 08/04/20 0327 08/05/20 0110  NA 139 140 137 139 140  K 6.5* 4.9 3.6 4.2 4.0  CL 104 102 99 97* 98  CO2 _1 32  GLUCOSE 156* 117* 93 176* 127*  BUN _2 CREATININE 0.68 0.65 0.62 0.78 0.77  CALCIUM 8.7* 8.7* 8.4* 8.9 9.3  MG  --   --   --  2.2 2.2  PHOS  --   --   --  4.5 3.8   GFR: Estimated Creatinine Clearance: 84.5 mL/min (by  C-G formula based on SCr of 0.77 mg/dL). Liver Function Tests: Recent Labs  Lab 08/01/20 0207 08/04/20 0327 08/05/20 0110  AST _0 ALT _1 ALKPHOS 77 78 73  BILITOT 0.3 0.4 0.6  PROT 5.4* 5.8* 6.1*  ALBUMIN 2.8* 2.7* 2.8*   No results for input(s): LIPASE, AMYLASE in the last 168 hours. No results for input(s): AMMONIA in the last 168 hours. Coagulation Profile: No results for input(s): INR, PROTIME in the last 168 hours. Cardiac Enzymes: No results for input(s): CKTOTAL, CKMB, CKMBINDEX, TROPONINI in the last 168 hours. BNP (last 3 results) No results for input(s): PROBNP in the last 8760 hours. HbA1C: No results for input(s): HGBA1C in the last 72 hours. CBG: Recent Labs  Lab 08/06/20 2001 08/06/20 2309 08/07/20 0420 08/07/20 0748 08/07/20 1152  GLUCAP 118* 139* 143* 114* 135*   Lipid Profile: No results for input(s): CHOL, HDL, LDLCALC, TRIG, CHOLHDL, LDLDIRECT in the last 72 hours. Thyroid Function Tests: No results  for input(s): TSH, T4TOTAL, FREET4, T3FREE, THYROIDAB in the last 72 hours. Anemia Panel: No results for input(s): VITAMINB12, FOLATE, FERRITIN, TIBC, IRON, RETICCTPCT in the last 72 hours. Sepsis Labs: No results for input(s): PROCALCITON, LATICACIDVEN in the last 168 hours.  No results found for this or any previous visit (from the past 240 hour(s)).   Radiology Studies: No results found.  Scheduled Meds: .  stroke: mapping our early stages of recovery book   Does not apply Once  . amLODipine  10 mg Per Tube Daily  . bictegravir-emtricitabine-tenofovir AF  1 tablet Oral Daily  . chlorhexidine  15 mL Mouth Rinse BID  . Chlorhexidine Gluconate Cloth  6 each Topical Daily  . cloNIDine  0.2 mg Per Tube BID  . enoxaparin (LOVENOX) injection  40 mg Subcutaneous Daily  . feeding supplement (PROSource TF)  45 mL Per Tube Daily  . insulin aspart  0-20 Units Subcutaneous Q4H  . insulin detemir  15 Units Subcutaneous BID  . lacosamide  150 mg Per Tube BID  . lactulose  20 g Per Tube TID  . levETIRAcetam  1,500 mg Per Tube BID  . mouth rinse  15 mL Mouth Rinse q12n4p  . multivitamin with minerals  1 tablet Per Tube Daily  . mupirocin ointment   Nasal BID  . pantoprazole sodium  40 mg Per Tube QHS  . PHENObarbital  64.8 mg Per Tube BID  . QUEtiapine  200 mg Per Tube BID  . sertraline  50 mg Per Tube QHS  . sodium chloride flush  3 mL Intravenous Once  . triamterene-hydrochlorothiazide  1 tablet Per Tube Daily  . valproic acid  500 mg Per Tube Q8H   Continuous Infusions: . feeding supplement (OSMOLITE 1.5 CAL) 55 mL/hr at 08/07/20 0659     LOS: 16 days    Time spent: 25 mins    Royetta Probus, MD [facility]   If 7PM-7AM, please contact night-coverage

## 2020-08-07 NOTE — TOC Progression Note (Addendum)
Transition of Care [facility]) - Progression Note    Patient Details  Name: Susan [name] MRN: [id number] Date of Birth: [birth year]  Transition of Care [facility]) CM/SW [place], LCSW Phone Number: [phone] 08/07/2020, 11:18 AM  Clinical Narrative:     CSW spoke with patient's daughter [name] in regards to patient not having any bed offers. CSW let [name] know that [name] did not offer her a bed. CSW inquired about extending the search outside of [place]. CSW asked about [name] and some facilities in [place] and received permission to fax out to those facilities.[name]  [name] informed CSW that patient lives with her therefore if she did not get bed offers that coming home would be an option.  CSW sent referral to other facilities..  TOC team will continue to assist with discharge planning needs.  Expected Discharge Plan: Home/Self Care Barriers to Discharge: Continued Medical Work up  Expected Discharge Plan and Services Expected Discharge Plan: Home/Self Care In-house Referral: NA Discharge Planning Services: CM Consult Post Acute Care Choice: NA Living arrangements for the past 2 months: Single Family Home                 DME Arranged: N/A DME Agency: NA       HH Arranged: NA HH Agency: NA         Social Determinants of Health (SDOH) Interventions    Readmission Risk Interventions Readmission Risk Prevention Plan 03/11/2020 02/19/2019  Transportation Screening Complete Complete  Medication Review Press photographer) Referral to Pharmacy Complete  PCP or Specialist appointment within 3-5 days of discharge Complete Complete  HRI or Home Care Consult Complete Complete  SW Recovery Care/Counseling Consult Complete Complete  Palliative Care Screening Not Applicable Not [place] Complete Not Applicable  Some recent data might be hidden

## 2020-08-08 DIAGNOSIS — B20 Human immunodeficiency virus [HIV] disease: Secondary | ICD-10-CM | POA: Diagnosis not present

## 2020-08-08 DIAGNOSIS — I10 Essential (primary) hypertension: Secondary | ICD-10-CM | POA: Diagnosis not present

## 2020-08-08 DIAGNOSIS — G9341 Metabolic encephalopathy: Secondary | ICD-10-CM | POA: Diagnosis not present

## 2020-08-08 DIAGNOSIS — K219 Gastro-esophageal reflux disease without esophagitis: Secondary | ICD-10-CM | POA: Diagnosis not present

## 2020-08-08 LAB — PHENOBARBITAL LEVEL: Phenobarbital: 21 ug/mL (ref 15.0–30.0)

## 2020-08-08 LAB — COMPREHENSIVE METABOLIC PANEL
ALT: 22 U/L (ref 0–44)
AST: 29 U/L (ref 15–41)
Albumin: 2.9 g/dL — ABNORMAL LOW (ref 3.5–5.0)
Alkaline Phosphatase: 77 U/L (ref 38–126)
Anion gap: 12 (ref 5–15)
BUN: 15 mg/dL (ref 6–20)
CO2: 30 mmol/L (ref 22–32)
Calcium: 9.4 mg/dL (ref 8.9–10.3)
Chloride: 96 mmol/L — ABNORMAL LOW (ref 98–111)
Creatinine, Ser: 0.69 mg/dL (ref 0.44–1.00)
GFR, Estimated: >60 mL/min (ref >60)
Glucose, Bld: 165 mg/dL — ABNORMAL HIGH (ref 70–99)
Potassium: 4.6 mmol/L (ref 3.5–5.1)
Sodium: 138 mmol/L (ref 135–145)
Total Bilirubin: 0.7 mg/dL (ref 0.3–1.2)
Total Protein: 6.3 g/dL — ABNORMAL LOW (ref 6.5–8.1)

## 2020-08-08 LAB — CBC
HCT: 37 % (ref 36.0–46.0)
Hemoglobin: 11.7 g/dL — ABNORMAL LOW (ref 12.0–15.0)
MCH: 28.3 pg (ref 26.0–34.0)
MCHC: 31.6 g/dL (ref 30.0–36.0)
MCV: 89.6 fL (ref 80.0–100.0)
Platelets: 124 10*3/uL — ABNORMAL LOW (ref 150–400)
RBC: 4.13 MIL/uL (ref 3.87–5.11)
RDW: 13.7 % (ref 11.5–15.5)
WBC: 4.7 10*3/uL (ref 4.0–10.5)
nRBC: 0 % (ref 0.0–0.2)

## 2020-08-08 LAB — MAGNESIUM: Magnesium: 2 mg/dL (ref 1.7–2.4)

## 2020-08-08 LAB — GLUCOSE, CAPILLARY
Glucose-Capillary: 104 mg/dL — ABNORMAL HIGH (ref 70–99)
Glucose-Capillary: 113 mg/dL — ABNORMAL HIGH (ref 70–99)
Glucose-Capillary: 145 mg/dL — ABNORMAL HIGH (ref 70–99)
Glucose-Capillary: 162 mg/dL — ABNORMAL HIGH (ref 70–99)
Glucose-Capillary: 180 mg/dL — ABNORMAL HIGH (ref 70–99)
Glucose-Capillary: 181 mg/dL — ABNORMAL HIGH (ref 70–99)

## 2020-08-08 LAB — VALPROIC ACID LEVEL: Valproic Acid Lvl: 30 ug/mL — ABNORMAL LOW (ref 50.0–100.0)

## 2020-08-08 LAB — PHOSPHORUS: Phosphorus: 4.5 mg/dL (ref 2.5–4.6)

## 2020-08-08 MED ORDER — ABACAVIR-DOLUTEGRAVIR-LAMIVUD 600-50-300 MG PO TABS
1.0000 | ORAL_TABLET | Freq: Every day | ORAL | Status: DC
  Administered 2020-08-08 – 2020-08-09 (×2): 1 via ORAL
  Filled 2020-08-08 (×2): qty 1

## 2020-08-08 MED ORDER — DOLUTEGRAVIR SODIUM 50 MG PO TABS
50.0000 mg | ORAL_TABLET | ORAL | Status: DC
  Filled 2020-08-08: qty 1

## 2020-08-08 MED ORDER — DOLUTEGRAVIR SODIUM 50 MG PO TABS
50.0000 mg | ORAL_TABLET | Freq: Every day | ORAL | Status: DC
  Administered 2020-08-08: 50 mg via ORAL
  Filled 2020-08-08 (×2): qty 1
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
## 2020-08-08 NOTE — Progress Notes (Signed)
Pt was yelling and screaming extremely loud, disturbing patients near her. Ativan 1mg  given with some relief. Will continue to monitor.

## 2020-08-08 NOTE — Progress Notes (Signed)
PROGRESS NOTE    Susan [name]  WFU:[id number] DOB: 1961/07/14 DOA: 07/20/2020 PCP: Hoyt Koch, MD    Brief Narrative: Susan [name] is a 58 y.o. female with extensive past medical history,  schizoaffective disorder/bipolar disorder, seizures, essential hypertension, diabetes mellitus type 2, HIV,  polysubstance abuse and history of stroke in August 2021 notable for prolonged hospitalization,  developed sepsis with respiratory failure during that time.  She had trach and PEG which is now removed,  comes into the hospital for acute encephalopathy on 07/29/2020 .  There was a concern for stroke. Patient was admitted for acute metabolic encephalopathy possibly multifactorial,  could be secondary to seizures / hypertensive urgency.  Assessment & Plan:   Active Problems:   Essential hypertension   Schizoaffective disorder, bipolar type (HCC)   GERD (gastroesophageal reflux disease)   Uncontrolled type 2 diabetes mellitus with hyperglycemia, with long-term current use of insulin (HCC)   Human immunodeficiency virus (HIV) disease (HCC)   Metabolic encephalopathy   PRES (posterior reversible encephalopathy syndrome)   Palliative care encounter   Acute metabolic encephalopathy secondary to seizures /essential hypertension: She was started on Cardene on admission, her MRI of the brain was compatible with PRES. She has been weaned off Cardene. NG tube was placed as patient was somnolent and she is currently on Norvasc, Coreg and clonidine. Her Maxide was started on 08/02/2020 and her blood pressure has been improving since then. Continue Coreg, Norvasc, clonidine and Maxide. She continues to be mildly encephalopathic, but improving.   Patient has waxing and waning in her mental status.  New onset epilepsy/chronic stroke: Continuous EEG showed status epilepticus. She is currently on phenobarbital, Keppra, Vimpat and Depakote. Neurology on board,  no further seizure events.  Continue seizure precautions. Neurology recommended increasing Keppra if there is further seizures,  continue others at same dosing. We consulted palliative care to discussed with family goals of care family, family wants to be full code and aggressive care.  Dysphagia: Patient failed swallowing evaluation. Needs repeat swallowing evaluation sometime this week to see if we can take the NG tube out, Speech recommended Dysphagia 2 Diet. Family is very reluctant and they will probably want everything done for her including a PEG tube. Palliative care has met with family , Currently with NG tube, continue tube feedings per nutrition.  History of schizophrenia/schizoaffective disorder/bipolar: Continue BuSpar, neurology recommended to restart Depakote, Seroquel, Vistaril and Zoloft.  Hold Cogentin 11/29 she had labile mood,  crying and screaming.   Psychiatry was consulted recommended to restart her Depakote Seroquel, Cogentin once more awake. Patient was agitated restless and yelling at the stopped having hallucinations. Psychiatry reconsulted recommended discontinue Haldol and start saphris twice daily. Patient is slight improvement in her mental status. Patient is actively hallucinating, not imminent danger to self or others. Psychiatry resumed all her home medications for her schizophrenia.  HIV/AIDS: Continue Hart therapy. Repeated CD4 count outpatient. Infectious disease consulted for adjustment in HIV medications. Patient has been getting medication through NG tube.  Uncontrolled diabetes mellitus type 2: Hb A1c of 7.7.  Blood glucose fairly controlled,   continue long-acting insulin plus sliding scale.  Hyperkalemia: It is improved , K 4.0  Maxide was held for 24 hours.   We have resumed Maxide. Repeat a basic metabolic panel in the morning.   DVT prophylaxis: Lovenox Code Status: Full Family Communication: Spoke with daughter at phone  Disposition Plan:     Status is: Inpatient  Remains inpatient appropriate because:Inpatient level  of care appropriate due to severity of illness   Dispo: The patient is from: Home              Anticipated d/c is to: SNF              Anticipated d/c date is: > 3 days              Patient currently is not medically stable to d/c.   Consultants:   Neurology and psychiatry  Procedures:  None Antimicrobials:  Anti-infectives (From admission, onward)   Start     Dose/Rate Route Frequency Ordered Stop   08/08/20 2200  dolutegravir (TIVICAY) tablet 50 mg  Status:  Discontinued        50 mg Oral As directed 08/08/20 1118 08/08/20 1227   08/08/20 2200  dolutegravir (TIVICAY) tablet 50 mg        50 mg Oral Daily 08/08/20 1227     08/08/20 1215  abacavir-dolutegravir-lamiVUDine (TRIUMEQ) 600-50-300 MG per tablet 1 tablet        1 tablet Oral Daily 08/08/20 1118     08/02/20 1000  bictegravir-emtricitabine-tenofovir AF (BIKTARVY) 50-200-25 MG per tablet 1 tablet  Status:  Discontinued        1 tablet Oral Daily 08/01/20 1114 08/08/20 1118   07/31/20 1000  dolutegravir (TIVICAY) tablet 50 mg  Status:  Discontinued       "And" Linked Group Details   50 mg Per Tube Daily 07/30/20 1023 08/01/20 1121   07/31/20 1000  emtricitabine-tenofovir AF (DESCOVY) 200-25 MG per tablet 1 tablet  Status:  Discontinued       "And" Linked Group Details   1 tablet Per Tube Daily 07/30/20 1023 08/01/20 1121   07/21/20 1000  bictegravir-emtricitabine-tenofovir AF (BIKTARVY) 50-200-25 MG per tablet 1 tablet  Status:  Discontinued        1 tablet Oral Daily 07/21/20 0058 07/30/20 1023     Subjective: Patient was seen and examined at bedside.  Overnight events noted.   Patient appears agitated and restless, she is hallucinating states she is seeing his kids. She has NG tube and getting medication through NG tube.    Objective: Vitals:   08/07/20 2016 08/08/20 0124 08/08/20 0432 08/08/20 1448  BP: 135/78 125/75 (!) 151/84 120/85   Pulse: 83 78 82 81  Resp: 18 18 19 18   Temp: 98.1 F (36.7 C) 98.1 F (36.7 C) 98.6 F (37 C) 97.6 F (36.4 C)  TempSrc: Oral Oral  Oral  SpO2: 100% 99% 100% 100%  Weight:      Height:        Intake/Output Summary (Last 24 hours) at 08/08/2020 1556 Last data filed at 08/08/2020 1500 Gross per 24 hour  Intake 1210 ml  Output 2650 ml  Net -1440 ml   Filed Weights   08/02/20 0441 08/03/20 0405 08/04/20 0413  Weight: 84.6 kg 86.4 kg 85.5 kg    Examination:  General exam: Appears calm and comfortable, not in any acute distress. Respiratory system: Clear to auscultation. Respiratory effort normal. Cardiovascular system: S1 & S2 heard, RRR. No JVD, murmurs, rubs, gallops or clicks. No pedal edema. Gastrointestinal system: Abdomen is nondistended, soft and nontender. No organomegaly or masses felt. Normal bowel sounds heard. Central nervous system: Alert and oriented x 1. No focal neurological deficits. Extremities: No edema, no cyanosis, no clubbing. Skin: No rashes, lesions or ulcers Psychiatry: Hallucinations ++, denies suicidal homicidal ideations.    Data Reviewed: I have personally reviewed following  labs and imaging studies  CBC: Recent Labs  Lab 08/04/20 0327 08/05/20 0110 08/08/20 0425  WBC 10.1 7.5 4.7  HGB 13.4 13.7 11.7*  HCT 41.7 42.4 37.0  MCV 89.9 88.7 89.6  PLT 318 367 014*   Basic Metabolic Panel: Recent Labs  Lab 08/02/20 0223 08/04/20 0327 08/05/20 0110 08/08/20 0425  NA 137 139 140 138  K 3.6 4.2 4.0 4.6  CL 99 97* 98 96*  CO2 31 31 32 30  GLUCOSE 93 176* 127* 165*  BUN 8 14 12 15   CREATININE 0.62 0.78 0.77 0.69  CALCIUM 8.4* 8.9 9.3 9.4  MG  --  2.2 2.2 2.0  PHOS  --  4.5 3.8 4.5   GFR: Estimated Creatinine Clearance: 84.5 mL/min (by C-G formula based on SCr of 0.69 mg/dL). Liver Function Tests: Recent Labs  Lab 08/04/20 0327 08/05/20 0110 08/08/20 0425  AST 24 21 29   ALT 21 20 22   ALKPHOS 78 73 77  BILITOT 0.4 0.6 0.7   PROT 5.8* 6.1* 6.3*  ALBUMIN 2.7* 2.8* 2.9*   No results for input(s): LIPASE, AMYLASE in the last 168 hours. No results for input(s): AMMONIA in the last 168 hours. Coagulation Profile: No results for input(s): INR, PROTIME in the last 168 hours. Cardiac Enzymes: No results for input(s): CKTOTAL, CKMB, CKMBINDEX, TROPONINI in the last 168 hours. BNP (last 3 results) No results for input(s): PROBNP in the last 8760 hours. HbA1C: No results for input(s): HGBA1C in the last 72 hours. CBG: Recent Labs  Lab 08/07/20 2032 08/08/20 0008 08/08/20 0341 08/08/20 0823 08/08/20 1159  GLUCAP 111* 104* 181* 113* 145*   Lipid Profile: No results for input(s): CHOL, HDL, LDLCALC, TRIG, CHOLHDL, LDLDIRECT in the last 72 hours. Thyroid Function Tests: No results for input(s): TSH, T4TOTAL, FREET4, T3FREE, THYROIDAB in the last 72 hours. Anemia Panel: No results for input(s): VITAMINB12, FOLATE, FERRITIN, TIBC, IRON, RETICCTPCT in the last 72 hours. Sepsis Labs: No results for input(s): PROCALCITON, LATICACIDVEN in the last 168 hours.  No results found for this or any previous visit (from the past 240 hour(s)).   Radiology Studies: No results found.  Scheduled Meds: .  stroke: mapping our early stages of recovery book   Does not apply Once  . abacavir-dolutegravir-lamiVUDine  1 tablet Oral Daily  . amLODipine  10 mg Per Tube Daily  . chlorhexidine  15 mL Mouth Rinse BID  . Chlorhexidine Gluconate Cloth  6 each Topical Daily  . cloNIDine  0.2 mg Per Tube BID  . dolutegravir  50 mg Oral Daily  . enoxaparin (LOVENOX) injection  40 mg Subcutaneous Daily  . feeding supplement (PROSource TF)  45 mL Per Tube Daily  . insulin aspart  0-20 Units Subcutaneous Q4H  . insulin detemir  15 Units Subcutaneous BID  . lacosamide  150 mg Per Tube BID  . lactulose  20 g Per Tube TID  . levETIRAcetam  1,500 mg Per Tube BID  . mouth rinse  15 mL Mouth Rinse q12n4p  . multivitamin with minerals  1  tablet Per Tube Daily  . mupirocin ointment   Nasal BID  . pantoprazole sodium  40 mg Per Tube QHS  . PHENObarbital  64.8 mg Per Tube BID  . QUEtiapine  200 mg Per Tube BID  . sertraline  50 mg Per Tube QHS  . sodium chloride flush  3 mL Intravenous Once  . triamterene-hydrochlorothiazide  1 tablet Per Tube Daily  . valproic acid  500  mg Per Tube Q8H   Continuous Infusions: . feeding supplement (OSMOLITE 1.5 CAL) 55 mL/hr at 08/08/20 0659     LOS: 17 days    Time spent: 25 mins    Susan Kasper, MD [facility]   If 7PM-7AM, please contact night-coverage

## 2020-08-08 NOTE — Plan of Care (Signed)
°  Problem: Clinical Measurements: °Goal: Ability to maintain clinical measurements within normal limits will improve °Outcome: Progressing °Goal: Will remain free from infection °Outcome: Progressing °Goal: Diagnostic test results will improve °Outcome: Progressing °Goal: Respiratory complications will improve °Outcome: Progressing °  °
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
## 2020-08-08 NOTE — Progress Notes (Signed)
Palliative Medicine RN Note: Discussed pt in team rounds. GOC are clear, and barriers to discharge are related to psychiatric and placement concerns. At this time, PMT will sign off. If her family requests to speak to [place], please re-consult [place].   Marjie Skiff Corianna Avallone, RN, BSN, [facility] Palliative Medicine Team 08/08/2020 11:13 AM Office [phone]

## 2020-08-09 LAB — GLUCOSE, CAPILLARY
Glucose-Capillary: 119 mg/dL — ABNORMAL HIGH (ref 70–99)
Glucose-Capillary: 140 mg/dL — ABNORMAL HIGH (ref 70–99)
Glucose-Capillary: 143 mg/dL — ABNORMAL HIGH (ref 70–99)
Glucose-Capillary: 173 mg/dL — ABNORMAL HIGH (ref 70–99)
Glucose-Capillary: 182 mg/dL — ABNORMAL HIGH (ref 70–99)

## 2020-08-09 LAB — BASIC METABOLIC PANEL
Anion gap: 11 (ref 5–15)
BUN: 15 mg/dL (ref 6–20)
CO2: 31 mmol/L (ref 22–32)
Calcium: 9.4 mg/dL (ref 8.9–10.3)
Chloride: 98 mmol/L (ref 98–111)
Creatinine, Ser: 0.77 mg/dL (ref 0.44–1.00)
GFR, Estimated: >60 mL/min (ref >60)
Glucose, Bld: 145 mg/dL — ABNORMAL HIGH (ref 70–99)
Potassium: 4.1 mmol/L (ref 3.5–5.1)
Sodium: 140 mmol/L (ref 135–145)

## 2020-08-09 LAB — CBC
HCT: 42.7 % (ref 36.0–46.0)
Hemoglobin: 13.6 g/dL (ref 12.0–15.0)
MCH: 28.4 pg (ref 26.0–34.0)
MCHC: 31.9 g/dL (ref 30.0–36.0)
MCV: 89.1 fL (ref 80.0–100.0)
Platelets: 425 10*3/uL — ABNORMAL HIGH (ref 150–400)
RBC: 4.79 MIL/uL (ref 3.87–5.11)
RDW: 13.8 % (ref 11.5–15.5)
WBC: 6.7 10*3/uL (ref 4.0–10.5)
nRBC: 0 % (ref 0.0–0.2)

## 2020-08-09 LAB — MAGNESIUM: Magnesium: 2.1 mg/dL (ref 1.7–2.4)

## 2020-08-09 LAB — PHOSPHORUS: Phosphorus: 4.6 mg/dL (ref 2.5–4.6)

## 2020-08-09 MED ORDER — ABACAVIR-DOLUTEGRAVIR-LAMIVUD 600-50-300 MG PO TABS
1.0000 | ORAL_TABLET | Freq: Every morning | ORAL | Status: DC
  Administered 2020-08-10 – 2020-08-17 (×8): 1 via ORAL
  Filled 2020-08-09 (×8): qty 1

## 2020-08-09 MED ORDER — DOLUTEGRAVIR SODIUM 50 MG PO TABS
50.0000 mg | ORAL_TABLET | Freq: Every day | ORAL | Status: DC
  Administered 2020-08-09 – 2020-08-16 (×8): 50 mg via ORAL
  Filled 2020-08-09 (×11): qty 1

## 2020-08-09 NOTE — Progress Notes (Addendum)
Physical Therapy Treatment Patient Details Name: Susan [name] MRN: [id number] DOB: Aug 29, 1961 Today's Date: 08/09/2020    History of Present Illness Pt is a 59 y.o. female admitted 07/20/20 with AMS. MRI compatible with posterior reversible encephalopathy syndrome (PRES); chronic bilateral posterior MCA and right PCA/MCA distribution infarcts. EEG showed new onset status epilepticus. PMH includes schizoaffective/bipolar disorder, seizures, HTN, DM2, HIV, polysubstance abuse; of note, recent admissions with prolonged stay 11/11/19-01/09/20 for sepsis s/p trach/PEG and removal, and 03/2020 with stroke.   PT Comments    Pt pleasantly confused with less restlessness noted this session; still not following majority of simple commands. Continues to demonstrate inconsistent R-side muscle activation and L-side gaze preference. RLE tone noted, but no significant tightness or contracture with cervical and RUE ROM. Pt required totalA+2 for mobility this session; tolerated sitting EOB. Little to no activation with standing attempts, requiring totalA+2 to scoot at EOB. Continue to recommend SNF-level therapies to maximize functional mobility and decrease caregiver burden.    Follow Up Recommendations  SNF;Supervision/Assistance - 24 hour     Equipment Recommendations  Wheelchair (measurements PT);Wheelchair cushion (measurements PT);Hospital bed;Hoyer lift    Recommendations for Other Services       Precautions / Restrictions Precautions Precautions: Fall;Other (comment) Precaution Comments: cortrak; AMS with LUE wrist restraint; R-side hemiparesis(?); hallucinating(?) Restrictions Weight Bearing Restrictions: No    Mobility  Bed Mobility Overal bed mobility: Needs Assistance Bed Mobility: Supine to Sit;Sit to Supine Rolling: Mod assist;Total assist;+2 for physical assistance   Supine to sit: Total assist;+2 for physical assistance;+2 for safety/equipment Sit to supine: Total assist;+2 for  physical assistance;+2 for safety/equipment   General bed mobility comments: ModA to roll onto L-side, totalA to roll onto R-side with inconsistent muscle activation and participation throughout. TotalA+2 for supine<>sit, attempted towards R-side of bed this session which seemed to be more difficult to pt compared to L-side  Transfers Overall transfer level: Needs assistance   Transfers: Squat Pivot Transfers          Lateral/Scoot Transfers: Total assist;+2 physical assistance General transfer comment: Pt with little to no activation for participating in standing, totalA+2 to offload buttocks and scoot/pivot towards HOB; noted R knee flexor activation to reposition feet, but could not illicit to command  Ambulation/Gait                 Stairs             Wheelchair Mobility    Modified Rankin (Stroke Patients Only)       Balance Overall balance assessment: Needs assistance Sitting-balance support: Bilateral upper extremity supported;Feet supported Sitting balance-Leahy Scale: Poor Sitting balance - Comments: Difficult to assess true sitting balance; able to maintain static sitting with LUE support, but randomly losing balance in all directions requiring assist to correct, then pushing in that direction and resisting assist to correct                                    Cognition Arousal/Alertness: Awake/alert;Lethargic;Suspect due to medications Behavior During Therapy: Restless (restless by end of session then flat/fallen asleep) Overall Cognitive Status: No family/caregiver present to determine baseline cognitive functioning Area of Impairment: Orientation;Attention;Following commands;Awareness;Problem solving                 Orientation Level: Disoriented to;Person;Place;Time;Situation Current Attention Level: Focused   Following Commands: Follows one step commands inconsistently   Awareness: Intellectual Problem Solving: Slow  processing;Decreased initiation;Difficulty sequencing;Requires verbal cues;Requires tactile cues General Comments: RN reports premedicated, but pt awaking to verbal/tactile stimulation, pleasantly confused - not answer questions appropriately or following commands. Still with L-size gaze preference and inconsistent LUE/LLE muscle activation. Endorses "I'm tired" and quick to fatigue with seated EOB activity; falling asleep by end of session      Exercises      General Comments        Pertinent Vitals/Pain Pain Assessment: Faces Faces Pain Scale: No hurt Pain Intervention(s): Monitored during session;Repositioned;Premedicated before session    Home Living                      Prior Function            PT Goals (current goals can now be found in the care plan section) Progress towards PT goals: Not progressing toward goals - comment (limited by cognitive/psych/fatigue)    Frequency    Min 2X/week      PT Plan Current plan remains appropriate    Co-evaluation PT/OT/SLP Co-Evaluation/Treatment: Yes Reason for Co-Treatment: Complexity of the patient's impairments (multi-system involvement);Necessary to address cognition/behavior during functional activity;For patient/therapist safety;To address functional/ADL transfers PT goals addressed during session: Mobility/safety with mobility;Balance        AM-PAC PT "6 Clicks" Mobility   Outcome Measure  Help needed turning from your back to your side while in a flat bed without using bedrails?: A Lot Help needed moving from lying on your back to sitting on the side of a flat bed without using bedrails?: Total Help needed moving to and from a bed to a chair (including a wheelchair)?: Total Help needed standing up from a chair using your arms (e.g., wheelchair or bedside chair)?: Total Help needed to walk in hospital room?: Total Help needed climbing 3-5 steps with a railing? : Total 6 Click Score: 7    End of Session  Equipment Utilized During Treatment: Gait belt Activity Tolerance: Patient limited by fatigue;Patient limited by lethargy;Other (comment) (cognitive impairment) Patient left: in bed;with call bell/phone within reach;with bed alarm set (with L wrist restraint reapplied) Nurse Communication: Mobility status PT Visit Diagnosis: Muscle weakness (generalized) (M62.81);Other abnormalities of gait and mobility (R26.89)     Time: 8875-7972 PT Time Calculation (min) (ACUTE ONLY): 24 min  Charges:  $Therapeutic Activity: 8-22 mins                     Mabeline Caras, PT, DPT Acute Rehabilitation Services  Pager [phone] Office [phone]  Derry Lory 08/09/2020, 1:18 PM

## 2020-08-09 NOTE — Plan of Care (Signed)
Problem: Education: Goal: Knowledge of General Education information will improve Description: Including pain rating scale, medication(s)/side effects and non-pharmacologic comfort measures 08/09/2020 1755 by Bethann Punches, RN Outcome: Progressing 08/09/2020 1117 by Bethann Punches, RN Outcome: Progressing 08/09/2020 1116 by Bethann Punches, RN Outcome: Progressing   Problem: Health Behavior/Discharge Planning: Goal: Ability to manage health-related needs will improve 08/09/2020 1755 by Bethann Punches, RN Outcome: Progressing 08/09/2020 1117 by Bethann Punches, RN Outcome: Progressing 08/09/2020 1116 by Bethann Punches, RN Outcome: Progressing   Problem: Clinical Measurements: Goal: Ability to maintain clinical measurements within normal limits will improve 08/09/2020 1755 by Bethann Punches, RN Outcome: Progressing 08/09/2020 1117 by Bethann Punches, RN Outcome: Progressing 08/09/2020 1116 by Bethann Punches, RN Outcome: Progressing Goal: Will remain free from infection 08/09/2020 1755 by Bethann Punches, RN Outcome: Progressing 08/09/2020 1117 by Bethann Punches, RN Outcome: Progressing 08/09/2020 1116 by Bethann Punches, RN Outcome: Progressing Goal: Diagnostic test results will improve 08/09/2020 1755 by Bethann Punches, RN Outcome: Progressing 08/09/2020 1117 by Bethann Punches, RN Outcome: Progressing 08/09/2020 1116 by Bethann Punches, RN Outcome: Progressing Goal: Respiratory complications will improve 08/09/2020 1755 by Bethann Punches, RN Outcome: Progressing 08/09/2020 1117 by Bethann Punches, RN Outcome: Progressing 08/09/2020 1116 by Bethann Punches, RN Outcome: Progressing Goal: Cardiovascular complication will be avoided 08/09/2020 1755 by Bethann Punches, RN Outcome: Progressing 08/09/2020 1117 by Bethann Punches, RN Outcome: Progressing 08/09/2020 1116 by Bethann Punches, RN Outcome: Progressing   Problem: Activity: Goal: Risk for activity intolerance will  decrease 08/09/2020 1755 by Bethann Punches, RN Outcome: Progressing 08/09/2020 1117 by Bethann Punches, RN Outcome: Progressing 08/09/2020 1116 by Bethann Punches, RN Outcome: Progressing   Problem: Nutrition: Goal: Adequate nutrition will be maintained 08/09/2020 1755 by Bethann Punches, RN Outcome: Progressing 08/09/2020 1117 by Bethann Punches, RN Outcome: Progressing 08/09/2020 1116 by Bethann Punches, RN Outcome: Progressing   Problem: Coping: Goal: Level of anxiety will decrease 08/09/2020 1755 by Bethann Punches, RN Outcome: Progressing 08/09/2020 1117 by Bethann Punches, RN Outcome: Progressing 08/09/2020 1116 by Bethann Punches, RN Outcome: Progressing   Problem: Elimination: Goal: Will not experience complications related to bowel motility 08/09/2020 1755 by Bethann Punches, RN Outcome: Progressing 08/09/2020 1117 by Bethann Punches, RN Outcome: Progressing 08/09/2020 1116 by Bethann Punches, RN Outcome: Progressing Goal: Will not experience complications related to urinary retention 08/09/2020 1755 by Bethann Punches, RN Outcome: Progressing 08/09/2020 1117 by Bethann Punches, RN Outcome: Progressing 08/09/2020 1116 by Bethann Punches, RN Outcome: Progressing   Problem: Pain Managment: Goal: General experience of comfort will improve 08/09/2020 1755 by Bethann Punches, RN Outcome: Progressing 08/09/2020 1117 by Bethann Punches, RN Outcome: Progressing 08/09/2020 1116 by Bethann Punches, RN Outcome: Progressing   Problem: Safety: Goal: Ability to remain free from injury will improve 08/09/2020 1755 by Bethann Punches, RN Outcome: Progressing 08/09/2020 1117 by Bethann Punches, RN Outcome: Progressing 08/09/2020 1116 by Bethann Punches, RN Outcome: Progressing   Problem: Skin Integrity: Goal: Risk for impaired skin integrity will decrease 08/09/2020 1755 by Bethann Punches, RN Outcome: Progressing 08/09/2020 1117 by Bethann Punches, RN Outcome:  Progressing 08/09/2020 1116 by Bethann Punches, RN Outcome: Progressing   Problem: Safety: Goal: Non-violent Restraint(s) 08/09/2020 1755 by Bethann Punches, RN Outcome: Progressing 08/09/2020 1117 by Bethann Punches, RN Outcome: Progressing 08/09/2020 1116 by  Marcie Mowers R, RN Outcome: Progressing   Problem: Education: Goal: Expressions of having a comfortable level of knowledge regarding the disease process will increase 08/09/2020 1755 by Bethann Punches, RN Outcome: Progressing 08/09/2020 1117 by Bethann Punches, RN Outcome: Progressing   Problem: Coping: Goal: Ability to adjust to condition or change in health will improve 08/09/2020 1755 by Bethann Punches, RN Outcome: Progressing 08/09/2020 1117 by Bethann Punches, RN Outcome: Progressing Goal: Ability to identify appropriate support needs will improve 08/09/2020 1755 by Bethann Punches, RN Outcome: Progressing 08/09/2020 1117 by Bethann Punches, RN Outcome: Progressing   Problem: Health Behavior/Discharge Planning: Goal: Compliance with prescribed medication regimen will improve 08/09/2020 1755 by Bethann Punches, RN Outcome: Progressing 08/09/2020 1117 by Bethann Punches, RN Outcome: Progressing   Problem: Medication: Goal: Risk for medication side effects will decrease 08/09/2020 1755 by Bethann Punches, RN Outcome: Progressing 08/09/2020 1117 by Bethann Punches, RN Outcome: Progressing   Problem: Clinical Measurements: Goal: Complications related to the disease process, condition or treatment will be avoided or minimized 08/09/2020 1755 by Bethann Punches, RN Outcome: Progressing 08/09/2020 1117 by Bethann Punches, RN Outcome: Progressing Goal: Diagnostic test results will improve 08/09/2020 1755 by Bethann Punches, RN Outcome: Progressing 08/09/2020 1117 by Bethann Punches, RN Outcome: Progressing   Problem: Safety: Goal: Verbalization of understanding the information provided will improve 08/09/2020 1755 by  Bethann Punches, RN Outcome: Progressing 08/09/2020 1117 by Bethann Punches, RN Outcome: Progressing   Problem: Self-Concept: Goal: Level of anxiety will decrease 08/09/2020 1755 by Bethann Punches, RN Outcome: Progressing 08/09/2020 1117 by Bethann Punches, RN Outcome: Progressing Goal: Ability to verbalize feelings about condition will improve 08/09/2020 1755 by Bethann Punches, RN Outcome: Progressing 08/09/2020 1117 by Bethann Punches, RN Outcome: Progressing   Problem: Education: Goal: Knowledge of disease or condition will improve Outcome: Progressing Goal: Knowledge of secondary prevention will improve Outcome: Progressing Goal: Knowledge of patient specific risk factors addressed and post discharge goals established will improve Outcome: Progressing Goal: Individualized Educational Video(s) Outcome: Progressing   Problem: Coping: Goal: Will verbalize positive feelings about self Outcome: Progressing Goal: Will identify appropriate support needs Outcome: Progressing   Problem: Health Behavior/Discharge Planning: Goal: Ability to manage health-related needs will improve Outcome: Progressing   Problem: Self-Care: Goal: Ability to participate in self-care as condition permits will improve Outcome: Progressing Goal: Verbalization of feelings and concerns over difficulty with self-care will improve Outcome: Progressing Goal: Ability to communicate needs accurately will improve Outcome: Progressing   Problem: Nutrition: Goal: Risk of aspiration will decrease Outcome: Progressing Goal: Dietary intake will improve Outcome: Progressing

## 2020-08-09 NOTE — Progress Notes (Signed)
PROGRESS NOTE    Susan [name]  ZJQ:[id number] DOB: [birth year] DOA: 07/20/2020 PCP: Hoyt Koch, MD    Brief Narrative: Susan [name] is a 59 y.o. female with extensive past medical history,  schizoaffective disorder / bipolar disorder, seizures, essential hypertension, diabetes mellitus type 2, HIV,  polysubstance abuse and history of stroke in August 2021 notable for prolonged hospitalization,  developed sepsis with respiratory failure during that time.  She had trach and PEG which is now removed,  comes into the hospital for acute encephalopathy on 07/29/2020 .  There was a concern for stroke. Patient was admitted for acute metabolic encephalopathy possibly multifactorial,  could be secondary to seizures / hypertension /medication induced. EEG is consistent with status epilepticus , seizures are controlled.  Psych medications resumed.  PT recommended SNF  Assessment & Plan:   Active Problems:   Essential hypertension   Schizoaffective disorder, bipolar type (HCC)   GERD (gastroesophageal reflux disease)   Uncontrolled type 2 diabetes mellitus with hyperglycemia, with long-term current use of insulin (HCC)   Human immunodeficiency virus (HIV) disease (HCC)   Metabolic encephalopathy   PRES (posterior reversible encephalopathy syndrome)   Palliative care encounter   Acute metabolic encephalopathy secondary to seizures / accelerated hypertension: She was started on Cardene on admission, her MRI of the brain was compatible with PRES. She has been weaned off Cardene. NG tube was placed as patient was somnolent and she is currently on Norvasc, Coreg and clonidine. Her Maxide was started on 08/02/2020 and her blood pressure has been improving since then. Continue Coreg, Norvasc, clonidine and Maxide. She continues to be mildly encephalopathic, but improving.   Patient has waxing and waning in her mental status. Palliative care consulted to discuss with family goals of care ,  family wants to be full code and aggressive care.   New onset epilepsy/chronic stroke: Continuous EEG showed status epilepticus. She is currently on phenobarbital, Keppra, Vimpat and Depakote. Neurology on board,  no further seizure events. Continue seizure precautions. Neurology recommended increasing Keppra if there is further seizures,  continue others at same dosing.  Dysphagia: Patient failed swallowing evaluation. Needs repeat swallowing evaluation sometime this week to see if we can take the NG tube out, Speech recommended Dysphagia 2 Diet. Family is very reluctant and they will probably want everything done for her including a PEG tube. Currently with NG tube, continue tube feedings per nutrition.  History of schizophrenia/schizoaffective disorder/bipolar: Continue BuSpar, neurology recommended to restart Depakote, Seroquel, Vistaril and Zoloft.  Hold Cogentin 11/29 she had labile mood,  crying and screaming.   Psychiatry was consulted recommended to restart her Depakote Seroquel, Cogentin once more awake. Patient was agitated restless and yelling , developed  hallucinations. Psychiatry reconsulted recommended discontinue Haldol and start saphris twice daily. Patient is actively hallucinating, not imminent danger to self or others. Psychiatry resumed all her home medications for her schizophrenia. She is slightly improved , appears calm, comfortable.  HIV/AIDS: Continue Hart therapy. Repeated CD4 count outpatient. Infectious disease consulted for adjustment in HIV medications. Patient has been getting medication through NG tube.  Uncontrolled diabetes mellitus type 2: Hb A1c of 7.7.  Blood glucose fairly controlled,   continue long-acting insulin plus sliding scale.  Hyperkalemia:>>> Resolved. It is improved , K 4.0  Maxide was held for 24 hours.   We have resumed Maxide. Repeat a basic metabolic panel in the morning.   DVT prophylaxis: Lovenox Code Status:  Full Family Communication: Spoke with daughter at phone  Disposition Plan:    Status is: Inpatient  Remains inpatient appropriate because:Inpatient level of care appropriate due to severity of illness   Dispo: The patient is from: Home              Anticipated d/c is to: SNF              Anticipated d/c date is: > 3 days              Patient currently is not medically stable to d/c.   Consultants:   Neurology and psychiatry  Procedures:  None Antimicrobials:  Anti-infectives (From admission, onward)   Start     Dose/Rate Route Frequency Ordered Stop   08/10/20 1000  abacavir-dolutegravir-lamiVUDine (TRIUMEQ) 600-50-300 MG per tablet 1 tablet        1 tablet Oral Every morning 08/09/20 1436     08/09/20 2200  dolutegravir (TIVICAY) tablet 50 mg        50 mg Oral Daily at bedtime 08/09/20 1436     08/08/20 2200  dolutegravir (TIVICAY) tablet 50 mg  Status:  Discontinued        50 mg Oral As directed 08/08/20 1118 08/08/20 1227   08/08/20 2200  dolutegravir (TIVICAY) tablet 50 mg  Status:  Discontinued        50 mg Oral Daily 08/08/20 1227 08/09/20 1436   08/08/20 1215  abacavir-dolutegravir-lamiVUDine (TRIUMEQ) 600-50-300 MG per tablet 1 tablet  Status:  Discontinued        1 tablet Oral Daily 08/08/20 1118 08/09/20 1436   08/02/20 1000  bictegravir-emtricitabine-tenofovir AF (BIKTARVY) 50-200-25 MG per tablet 1 tablet  Status:  Discontinued        1 tablet Oral Daily 08/01/20 1114 08/08/20 1118   07/31/20 1000  dolutegravir (TIVICAY) tablet 50 mg  Status:  Discontinued       "And" Linked Group Details   50 mg Per Tube Daily 07/30/20 1023 08/01/20 1121   07/31/20 1000  emtricitabine-tenofovir AF (DESCOVY) 200-25 MG per tablet 1 tablet  Status:  Discontinued       "And" Linked Group Details   1 tablet Per Tube Daily 07/30/20 1023 08/01/20 1121   07/21/20 1000  bictegravir-emtricitabine-tenofovir AF (BIKTARVY) 50-200-25 MG per tablet 1 tablet  Status:  Discontinued        1  tablet Oral Daily 07/21/20 0058 07/30/20 1023     Subjective: Patient was seen and examined at bedside.  Overnight events noted.   She appears much calmer and comfortable today after Saphris was started yesterday. She has NG tube and getting medication through NG tube.  Objective: Vitals:   08/08/20 1448 08/08/20 2014 08/09/20 0426 08/09/20 1311  BP: 120/85 137/78 128/80 119/62  Pulse: 81 85 79 82  Resp: 18 18 17 19   Temp: 97.6 F (36.4 C) 98.9 F (37.2 C) 98 F (36.7 C) 97.7 F (36.5 C)  TempSrc: Oral     SpO2: 100% 99% 100%   Weight:      Height:        Intake/Output Summary (Last 24 hours) at 08/09/2020 1537 Last data filed at 08/09/2020 1305 Gross per 24 hour  Intake 290 ml  Output 1200 ml  Net -910 ml   Filed Weights   08/02/20 0441 08/03/20 0405 08/04/20 0413  Weight: 84.6 kg 86.4 kg 85.5 kg    Examination:  General exam: Appears calm and comfortable, not in any acute distress. Respiratory system: Clear to auscultation. Respiratory effort normal. Cardiovascular system: S1 &  S2 heard, RRR. No JVD, murmurs, rubs, gallops or clicks. No pedal edema. Gastrointestinal system: Abdomen is nondistended, soft and nontender. No organomegaly or masses felt. Normal bowel sounds heard. Central nervous system: Alert and oriented x 1. No focal neurological deficits. Extremities: No edema, no cyanosis, no clubbing. Skin: No rashes, lesions or ulcers Psychiatry: Hallucinations ++, denies suicidal,  homicidal ideations.    Data Reviewed: I have personally reviewed following labs and imaging studies  CBC: Recent Labs  Lab 08/04/20 0327 08/05/20 0110 08/08/20 0425 08/09/20 0408  WBC 10.1 7.5 4.7 6.7  HGB 13.4 13.7 11.7* 13.6  HCT 41.7 42.4 37.0 42.7  MCV 89.9 88.7 89.6 89.1  PLT 318 367 124* 209*   Basic Metabolic Panel: Recent Labs  Lab 08/04/20 0327 08/05/20 0110 08/08/20 0425 08/09/20 0408  NA 139 140 138 140  K 4.2 4.0 4.6 4.1  CL 97* 98 96* 98  CO2 31 32  30 31  GLUCOSE 176* 127* 165* 145*  BUN 14 12 15 15   CREATININE 0.78 0.77 0.69 0.77  CALCIUM 8.9 9.3 9.4 9.4  MG 2.2 2.2 2.0 2.1  PHOS 4.5 3.8 4.5 4.6   GFR: Estimated Creatinine Clearance: 84.5 mL/min (by C-G formula based on SCr of 0.77 mg/dL). Liver Function Tests: Recent Labs  Lab 08/04/20 0327 08/05/20 0110 08/08/20 0425  AST 24 21 29   ALT 21 20 22   ALKPHOS 78 73 77  BILITOT 0.4 0.6 0.7  PROT 5.8* 6.1* 6.3*  ALBUMIN 2.7* 2.8* 2.9*   No results for input(s): LIPASE, AMYLASE in the last 168 hours. No results for input(s): AMMONIA in the last 168 hours. Coagulation Profile: No results for input(s): INR, PROTIME in the last 168 hours. Cardiac Enzymes: No results for input(s): CKTOTAL, CKMB, CKMBINDEX, TROPONINI in the last 168 hours. BNP (last 3 results) No results for input(s): PROBNP in the last 8760 hours. HbA1C: No results for input(s): HGBA1C in the last 72 hours. CBG: Recent Labs  Lab 08/08/20 2019 08/09/20 0015 08/09/20 0427 08/09/20 0735 08/09/20 1136  GLUCAP 180* 143* 140* 173* 182*   Lipid Profile: No results for input(s): CHOL, HDL, LDLCALC, TRIG, CHOLHDL, LDLDIRECT in the last 72 hours. Thyroid Function Tests: No results for input(s): TSH, T4TOTAL, FREET4, T3FREE, THYROIDAB in the last 72 hours. Anemia Panel: No results for input(s): VITAMINB12, FOLATE, FERRITIN, TIBC, IRON, RETICCTPCT in the last 72 hours. Sepsis Labs: No results for input(s): PROCALCITON, LATICACIDVEN in the last 168 hours.  No results found for this or any previous visit (from the past 240 hour(s)).   Radiology Studies: No results found.  Scheduled Meds: .  stroke: mapping our early stages of recovery book   Does not apply Once  . [START ON 08/10/2020] abacavir-dolutegravir-lamiVUDine  1 tablet Oral q AM  . amLODipine  10 mg Per Tube Daily  . chlorhexidine  15 mL Mouth Rinse BID  . Chlorhexidine Gluconate Cloth  6 each Topical Daily  . cloNIDine  0.2 mg Per Tube BID  .  dolutegravir  50 mg Oral QHS  . enoxaparin (LOVENOX) injection  40 mg Subcutaneous Daily  . feeding supplement (PROSource TF)  45 mL Per Tube Daily  . insulin aspart  0-20 Units Subcutaneous Q4H  . insulin detemir  15 Units Subcutaneous BID  . lacosamide  150 mg Per Tube BID  . lactulose  20 g Per Tube TID  . levETIRAcetam  1,500 mg Per Tube BID  . mouth rinse  15 mL Mouth Rinse q12n4p  .  multivitamin with minerals  1 tablet Per Tube Daily  . mupirocin ointment   Nasal BID  . pantoprazole sodium  40 mg Per Tube QHS  . PHENObarbital  64.8 mg Per Tube BID  . QUEtiapine  200 mg Per Tube BID  . sertraline  50 mg Per Tube QHS  . triamterene-hydrochlorothiazide  1 tablet Per Tube Daily  . valproic acid  500 mg Per Tube Q8H   Continuous Infusions: . feeding supplement (OSMOLITE 1.5 CAL) 1,000 mL (08/08/20 1922)     LOS: 18 days    Time spent: 25 mins    Reannon Candella, MD [facility]   If 7PM-7AM, please contact night-coverage

## 2020-08-09 NOTE — Progress Notes (Signed)
Occupational Therapy Treatment Patient Details Name: Susan [name] MRN: [id number] DOB: [birth year] Today's Date: 08/09/2020    History of present illness Pt is a 59 y.o. female admitted 07/20/20 with AMS. MRI compatible with posterior reversible encephalopathy syndrome (PRES); chronic bilateral posterior MCA and right PCA/MCA distribution infarcts. EEG showed new onset status epilepticus. PMH includes schizoaffective/bipolar disorder, seizures, HTN, DM2, HIV, polysubstance abuse; of note, recent admissions with prolonged stay 11/11/19-01/09/20 for sepsis s/p trach/PEG and removal, and 03/2020 with stroke.   OT comments  Goals downgraded and re-established this session. Pt today is pleasant, perseverating on "tired vs trials?!?" not following single step commands but does track therapists to the right when physical assist is used to turn head to midline. RUE is completely flaccid today - Pt does not withdraw from painful nailbed stimulus. OT able to perform PROM at all joint to full extent without grimace or other indicator of pain. Also provided therapeutic massage on BUE applying lotion. Pt remains at max (hand over hand) A to total A for all aspects of ADL. OT will continue to follow once a week to see if command following improves for more meaningful participation in therapy.    Follow Up Recommendations  SNF;Supervision/Assistance - 24 hour    Equipment Recommendations  Other (comment) (defer to next venue)    Recommendations for Other Services  (Psych and Palliative)    Precautions / Restrictions Precautions Precautions: Fall;Other (comment) Precaution Comments: cortrak; AMS with LUE wrist restraint; R-side hemiparesis(?); hallucinating(?) Restrictions Weight Bearing Restrictions: No       Mobility Bed Mobility Overal bed mobility: Needs Assistance Bed Mobility: Supine to Sit;Sit to Supine Rolling: Mod assist;Total assist;+2 for physical assistance   Supine to sit: Total assist;+2  for physical assistance;+2 for safety/equipment Sit to supine: Total assist;+2 for physical assistance;+2 for safety/equipment   General bed mobility comments: ModA to roll onto L-side, totalA to roll onto R-side with inconsistent muscle activation and participation throughout. TotalA+2 for supine<>sit, attempted towards R-side of bed this session which seemed to be more difficult to pt compared to L-side  Transfers Overall transfer level: Needs assistance   Transfers: Squat Pivot Transfers (up the bed)     Squat pivot transfers: Total assist;+2 physical assistance;+2 safety/equipment    Lateral/Scoot Transfers: Total assist;+2 physical assistance General transfer comment: Pt with little to no activation for participating in standing, totalA+2 to offload buttocks and scoot/pivot towards HOB; noted R knee flexor activation to reposition feet, but could not illicit to command    Balance Overall balance assessment: Needs assistance Sitting-balance support: Bilateral upper extremity supported;Feet supported Sitting balance-Leahy Scale: Poor Sitting balance - Comments: Difficult to assess true sitting balance; able to maintain static sitting with LUE support, but randomly losing balance in all directions requiring assist to correct, then pushing in that direction and resisting assist to correct                                   ADL either performed or assessed with clinical judgement   ADL Overall ADL's : Needs assistance/impaired                                       General ADL Comments: continues to be max to total A for all aspects of ADL - not following directions/commands at this time  Vision   Additional Comments: Pt continues with left gaze preference, with assisted head turn, will cross past midline with auditory cues   Perception     Praxis      Cognition Arousal/Alertness: Awake/alert;Lethargic;Suspect due to medications Behavior  During Therapy: Restless (initially restless, then flat/sleep at end of session) Overall Cognitive Status: No family/caregiver present to determine baseline cognitive functioning Area of Impairment: Orientation;Attention;Following commands;Awareness;Problem solving                 Orientation Level: Disoriented to;Person;Place;Time;Situation Current Attention Level: Focused   Following Commands: Follows one step commands inconsistently   Awareness: Intellectual Problem Solving: Slow processing;Decreased initiation;Difficulty sequencing;Requires verbal cues;Requires tactile cues General Comments: RN reports premedicated, but pt awaking to verbal/tactile stimulation, pleasantly confused - not answer questions appropriately or following commands. Still with L-size gaze preference and inconsistent LUE/LLE muscle activation. Endorses "I'm tired" and quick to fatigue with seated EOB activity; falling asleep by end of session        Exercises Exercises: Other exercises Other Exercises Other Exercises: PROM R fingers/wrist/elbow/shoulder w/ no active movement noted - no signs of discomfort with this   Shoulder Instructions       General Comments      Pertinent Vitals/ Pain       Pain Assessment: Faces Faces Pain Scale: No hurt Pain Intervention(s): Monitored during session;Repositioned  Home Living                                          Prior Functioning/Environment              Frequency  Min 1X/week        Progress Toward Goals  OT Goals(current goals can now be found in the care plan section)  Progress towards OT goals: Goals drowngraded-see care plan  Acute Rehab OT Goals Patient Stated Goal: none stated OT Goal Formulation: Patient unable to participate in goal setting ADL Goals Pt Will Perform Grooming: with mod assist;bed level Additional ADL Goal #1: Pt will follow one step commands 1/4 times for ADL completion Additional ADL Goal  #2: Pt will perform bed mobility with max A to come EOB in preparation to participate in ADL  Plan Discharge plan remains appropriate    Co-evaluation    PT/OT/SLP Co-Evaluation/Treatment: Yes Reason for Co-Treatment: Complexity of the patient's impairments (multi-system involvement);Necessary to address cognition/behavior during functional activity;For patient/therapist safety;To address functional/ADL transfers PT goals addressed during session: Mobility/safety with mobility;Balance;Strengthening/ROM OT goals addressed during session: ADL's and self-care;Strengthening/ROM      AM-PAC OT "6 Clicks" Daily Activity     Outcome Measure   Help from another person eating meals?: Total Help from another person taking care of personal grooming?: Total Help from another person toileting, which includes using toliet, bedpan, or urinal?: Total Help from another person bathing (including washing, rinsing, drying)?: Total Help from another person to put on and taking off regular upper body clothing?: Total Help from another person to put on and taking off regular lower body clothing?: Total 6 Click Score: 6    End of Session Equipment Utilized During Treatment: Gait belt  OT Visit Diagnosis: Other abnormalities of gait and mobility (R26.89);Other symptoms and signs involving cognitive function;Low vision, both eyes (H54.2);Muscle weakness (generalized) (M62.81)   Activity Tolerance Patient limited by fatigue   Patient Left in bed;with call bell/phone within reach;with bed alarm set  Nurse Communication Mobility status        Time: 7026-3785 OT Time Calculation (min): 24 min  Charges: OT General Charges $OT Visit: 1 Visit OT Treatments $Therapeutic Activity: 8-22 mins Jesse Sans OTR/L Acute Rehabilitation Services Pager: [phone] Office: [place] 08/09/2020, 1:45 PM

## 2020-08-09 NOTE — Progress Notes (Signed)
  Speech Language Pathology Treatment: Dysphagia  Patient Details Name: Susan [name] MRN: [id number] DOB: [birth year] Today's Date: 08/09/2020 Time: 1000-1018 SLP Time Calculation (min) (ACUTE ONLY): 18 min  Assessment / Plan / Recommendation Clinical Impression  Pt continues to tolerate soft solids and thin liquid without any clinical s/s of aspiration.  Pt exhibited slightly prolonged oral phase and there was mild oral residue which was fully cleared with liquid wash.  Pt was unable to bite regular solid texture cracker 2/2 edentulism.  Pt still has NG tube with tube feeds running during today's session.  Given that pt is tolerating POs, consider d/c tube feeds.   Recommend continuing mechanical soft diet with thin liquids.  Pt will need full assistance with feeding.  Please monitor oral cavity for residue and alternate solids and liquids as needed.   SLP will sign off at this time.  Please re-consult ST if there is a change in status.    HPI HPI: Susan [name] is a 59 y.o. female with extensive PMH schizoaffective disorder/bipolar, seizures, essential hypertension, diabetes mellitus type 2, HIV, polysubstance abuse. History of stroke in August 2021 notable for prolonged hospitalization- developed sepsis with respiratory failure during this time she was trach and PEG which is now removed. Admitted presently for encephalopathy, MRI of the brain was done that compatible with PRES. EEG showed new onset status epilepticus. Pt is well known to ST service from previous hospitalizations- the pt typically demonstrates a mild-moderate pharyngeal dysphagia exacerbated by cognitive impairment/lethargy. As the pt's condition improves, he swallow improves as well. Last swallow evaluation in July 2021 recommended regular/thin with liquid wash. Pt was last seen by SLP on 12/1 and a dysphagia 2 diet with thin liquids was recommended at that time. Per referring MD's note, pt's encephalopathy is improved.        SLP Plan  All goals met;Discharge SLP treatment due to (comment)       Recommendations  Diet recommendations: Dysphagia 3 (mechanical soft);Thin liquid Liquids provided via: Cup;Straw Medication Administration:  (as tolerated) Supervision: Staff to assist with self feeding;Full supervision/cueing for compensatory strategies;Trained caregiver to feed patient Compensations: Minimize environmental distractions;Follow solids with liquid;Slow rate;Small sips/bites (Check for oral residue) Postural Changes and/or Swallow Maneuvers: Seated upright 90 degrees                Oral Care Recommendations: Oral care BID Follow up Recommendations: Skilled Nursing facility SLP Visit Diagnosis: Dysphagia, unspecified (R13.10) Plan: All goals met;Discharge SLP treatment due to (comment)       [place], [place], [place] Office: [phone] 08/09/2020, 10:22 AM

## 2020-08-09 NOTE — Plan of Care (Signed)
Problem: Education: Goal: Knowledge of General Education information will improve Description: Including pain rating scale, medication(s)/side effects and non-pharmacologic comfort measures 08/09/2020 1117 by Bethann Punches, RN Outcome: Progressing 08/09/2020 1116 by Bethann Punches, RN Outcome: Progressing   Problem: Health Behavior/Discharge Planning: Goal: Ability to manage health-related needs will improve 08/09/2020 1117 by Bethann Punches, RN Outcome: Progressing 08/09/2020 1116 by Bethann Punches, RN Outcome: Progressing   Problem: Clinical Measurements: Goal: Ability to maintain clinical measurements within normal limits will improve 08/09/2020 1117 by Bethann Punches, RN Outcome: Progressing 08/09/2020 1116 by Bethann Punches, RN Outcome: Progressing Goal: Will remain free from infection 08/09/2020 1117 by Bethann Punches, RN Outcome: Progressing 08/09/2020 1116 by Bethann Punches, RN Outcome: Progressing Goal: Diagnostic test results will improve 08/09/2020 1117 by Bethann Punches, RN Outcome: Progressing 08/09/2020 1116 by Bethann Punches, RN Outcome: Progressing Goal: Respiratory complications will improve 08/09/2020 1117 by Bethann Punches, RN Outcome: Progressing 08/09/2020 1116 by Bethann Punches, RN Outcome: Progressing Goal: Cardiovascular complication will be avoided 08/09/2020 1117 by Bethann Punches, RN Outcome: Progressing 08/09/2020 1116 by Bethann Punches, RN Outcome: Progressing   Problem: Activity: Goal: Risk for activity intolerance will decrease 08/09/2020 1117 by Bethann Punches, RN Outcome: Progressing 08/09/2020 1116 by Bethann Punches, RN Outcome: Progressing   Problem: Nutrition: Goal: Adequate nutrition will be maintained 08/09/2020 1117 by Bethann Punches, RN Outcome: Progressing 08/09/2020 1116 by Bethann Punches, RN Outcome: Progressing   Problem: Coping: Goal: Level of anxiety will decrease 08/09/2020 1117 by Bethann Punches, RN Outcome:  Progressing 08/09/2020 1116 by Bethann Punches, RN Outcome: Progressing   Problem: Elimination: Goal: Will not experience complications related to bowel motility 08/09/2020 1117 by Bethann Punches, RN Outcome: Progressing 08/09/2020 1116 by Bethann Punches, RN Outcome: Progressing Goal: Will not experience complications related to urinary retention 08/09/2020 1117 by Bethann Punches, RN Outcome: Progressing 08/09/2020 1116 by Bethann Punches, RN Outcome: Progressing   Problem: Pain Managment: Goal: General experience of comfort will improve 08/09/2020 1117 by Bethann Punches, RN Outcome: Progressing 08/09/2020 1116 by Bethann Punches, RN Outcome: Progressing   Problem: Safety: Goal: Ability to remain free from injury will improve 08/09/2020 1117 by Bethann Punches, RN Outcome: Progressing 08/09/2020 1116 by Bethann Punches, RN Outcome: Progressing   Problem: Skin Integrity: Goal: Risk for impaired skin integrity will decrease 08/09/2020 1117 by Bethann Punches, RN Outcome: Progressing 08/09/2020 1116 by Bethann Punches, RN Outcome: Progressing   Problem: Safety: Goal: Non-violent Restraint(s) 08/09/2020 1117 by Bethann Punches, RN Outcome: Progressing 08/09/2020 1116 by Bethann Punches, RN Outcome: Progressing   Problem: Education: Goal: Expressions of having a comfortable level of knowledge regarding the disease process will increase Outcome: Progressing   Problem: Coping: Goal: Ability to adjust to condition or change in health will improve Outcome: Progressing Goal: Ability to identify appropriate support needs will improve Outcome: Progressing   Problem: Health Behavior/Discharge Planning: Goal: Compliance with prescribed medication regimen will improve Outcome: Progressing   Problem: Medication: Goal: Risk for medication side effects will decrease Outcome: Progressing   Problem: Clinical Measurements: Goal: Complications related to the disease process, condition  or treatment will be avoided or minimized Outcome: Progressing Goal: Diagnostic test results will improve Outcome: Progressing   Problem: Safety: Goal: Verbalization of understanding the information provided will improve Outcome: Progressing   Problem: Self-Concept: Goal: Level of anxiety will decrease Outcome: Progressing Goal:  Ability to verbalize feelings about condition will improve Outcome: Progressing

## 2020-08-10 LAB — GLUCOSE, CAPILLARY
Glucose-Capillary: 115 mg/dL — ABNORMAL HIGH (ref 70–99)
Glucose-Capillary: 116 mg/dL — ABNORMAL HIGH (ref 70–99)
Glucose-Capillary: 127 mg/dL — ABNORMAL HIGH (ref 70–99)
Glucose-Capillary: 131 mg/dL — ABNORMAL HIGH (ref 70–99)
Glucose-Capillary: 136 mg/dL — ABNORMAL HIGH (ref 70–99)
Glucose-Capillary: 137 mg/dL — ABNORMAL HIGH (ref 70–99)
Glucose-Capillary: 143 mg/dL — ABNORMAL HIGH (ref 70–99)
Glucose-Capillary: 157 mg/dL — ABNORMAL HIGH (ref 70–99)

## 2020-08-10 MED ORDER — OSMOLITE 1.5 CAL PO LIQD
720.0000 mL | ORAL | Status: DC
  Administered 2020-08-10: 720 mL

## 2020-08-10 NOTE — Plan of Care (Signed)
  Problem: Education: Goal: Knowledge of General Education information will improve Description: Including pain rating scale, medication(s)/side effects and non-pharmacologic comfort measures Outcome: Progressing   Problem: Health Behavior/Discharge Planning: Goal: Ability to manage health-related needs will improve Outcome: Progressing   Problem: Clinical Measurements: Goal: Ability to maintain clinical measurements within normal limits will improve Outcome: Progressing Goal: Will remain free from infection Outcome: Progressing Goal: Diagnostic test results will improve Outcome: Progressing Goal: Respiratory complications will improve Outcome: Progressing Goal: Cardiovascular complication will be avoided Outcome: Progressing   Problem: Activity: Goal: Risk for activity intolerance will decrease Outcome: Progressing   Problem: Nutrition: Goal: Adequate nutrition will be maintained Outcome: Progressing   Problem: Coping: Goal: Level of anxiety will decrease Outcome: Progressing   Problem: Elimination: Goal: Will not experience complications related to bowel motility Outcome: Progressing Goal: Will not experience complications related to urinary retention Outcome: Progressing   Problem: Pain Managment: Goal: General experience of comfort will improve Outcome: Progressing   Problem: Safety: Goal: Ability to remain free from injury will improve Outcome: Progressing   Problem: Skin Integrity: Goal: Risk for impaired skin integrity will decrease Outcome: Progressing   Problem: Safety: Goal: Non-violent Restraint(s) Outcome: Progressing   Problem: Education: Goal: Expressions of having a comfortable level of knowledge regarding the disease process will increase Outcome: Progressing   Problem: Coping: Goal: Ability to adjust to condition or change in health will improve Outcome: Progressing Goal: Ability to identify appropriate support needs will  improve Outcome: Progressing   Problem: Health Behavior/Discharge Planning: Goal: Compliance with prescribed medication regimen will improve Outcome: Progressing   Problem: Medication: Goal: Risk for medication side effects will decrease Outcome: Progressing   Problem: Clinical Measurements: Goal: Complications related to the disease process, condition or treatment will be avoided or minimized Outcome: Progressing Goal: Diagnostic test results will improve Outcome: Progressing   Problem: Safety: Goal: Verbalization of understanding the information provided will improve Outcome: Progressing   Problem: Self-Concept: Goal: Level of anxiety will decrease Outcome: Progressing Goal: Ability to verbalize feelings about condition will improve Outcome: Progressing   Problem: Education: Goal: Knowledge of disease or condition will improve Outcome: Progressing Goal: Knowledge of secondary prevention will improve Outcome: Progressing Goal: Knowledge of patient specific risk factors addressed and post discharge goals established will improve Outcome: Progressing Goal: Individualized Educational Video(s) Outcome: Progressing   Problem: Coping: Goal: Will verbalize positive feelings about self Outcome: Progressing Goal: Will identify appropriate support needs Outcome: Progressing   Problem: Health Behavior/Discharge Planning: Goal: Ability to manage health-related needs will improve Outcome: Progressing   Problem: Self-Care: Goal: Ability to participate in self-care as condition permits will improve Outcome: Progressing Goal: Verbalization of feelings and concerns over difficulty with self-care will improve Outcome: Progressing Goal: Ability to communicate needs accurately will improve Outcome: Progressing   Problem: Nutrition: Goal: Risk of aspiration will decrease Outcome: Progressing Goal: Dietary intake will improve Outcome: Progressing

## 2020-08-10 NOTE — Progress Notes (Signed)
Patient has been fed throughout the day. I just finsihed feeding her but she does not intake to much, she had a couple bites of mashed potatoes with green beans and [place], about 5 spoonfuls. She drank the whole Iced tea. She had couple sips of her milkshake and she fell asleep halfway.  Checked patients mouth for residue, none noted. Released her left hand from restraints as a trail run.  NG tube still in place and feeds are on hold until tonight.

## 2020-08-10 NOTE — Progress Notes (Signed)
PROGRESS NOTE    Susan [name]  IOX:[id number] DOB: 15-Dec-1960 DOA: 07/20/2020 PCP: Hoyt Koch, MD    Brief Narrative: Susan [name] is a 59 y.o. female with extensive past medical history,  schizoaffective disorder / bipolar disorder, seizures, essential hypertension, diabetes mellitus type 2, HIV,  polysubstance abuse and history of stroke in August 2021 notable for prolonged hospitalization,  developed sepsis with respiratory failure during that time.  She had trach and PEG which is now removed,  comes into the hospital for acute encephalopathy on 07/29/2020 .  There was a concern for stroke. Patient was admitted for acute metabolic encephalopathy possibly multifactorial,  could be secondary to seizures / hypertension /medication induced. EEG is consistent with status epilepticus , seizures are controlled.  Psych medications resumed.  PT recommended SNF  Assessment & Plan:   Active Problems:   Essential hypertension   Schizoaffective disorder, bipolar type (HCC)   GERD (gastroesophageal reflux disease)   Uncontrolled type 2 diabetes mellitus with hyperglycemia, with long-term current use of insulin (HCC)   Human immunodeficiency virus (HIV) disease (HCC)   Metabolic encephalopathy   PRES (posterior reversible encephalopathy syndrome)   Palliative care encounter   Acute metabolic encephalopathy secondary to seizures / accelerated hypertension: She was started on Cardene on admission, her MRI of the brain was compatible with PRES. She has been weaned off Cardene. Continue Norvasc, clonidine and Maxide.  Blood pressures controlled. Ongoing mental status changes.  Unclear how much of this is due to encephalopathy versus baseline psychiatry issues. As per RN report, intermittently wakes up and shouts, attempts to get out of bed, attempts to pull at NG tube, has left hand restraint, talks to people for not there. Palliative care consulted to discuss with family goals of care ,  family wants to be full code and aggressive care.  New onset epilepsy/chronic stroke: Continuous EEG showed status epilepticus. She is currently on phenobarbital, Keppra, Vimpat and Depakote. Neurology were on board,  no further seizure events. Continue seizure precautions. Neurology recommended increasing Keppra if there is further seizures,  continue others at same dosing.  Dysphagia: Patient initially failed swallowing evaluation. Per speech therapy follow-up 12/7, dysphagia 3 diet and thin liquids. However patient reportedly not eating much, drinking better, hence still has NG tube in place with tube feeds. Family is very reluctant and they will probably want everything done for her including a PEG tube.  History of schizophrenia/schizoaffective disorder/bipolar: Continue BuSpar, neurology recommended to restart Depakote, Seroquel, Vistaril and Zoloft.  Hold Cogentin 11/29 she had labile mood,  crying and screaming.   Psychiatry was consulted recommended to restart her Depakote Seroquel, Cogentin once more awake. Patient was agitated restless and yelling , developed  hallucinations. Psychiatry reconsulted recommended discontinue Haldol and start saphris twice daily. Patient is actively hallucinating, not imminent danger to self or others. Psychiatry resumed all her home medications for her schizophrenia. Mental status as noted above.  Will need ongoing close psychiatry follow-up for adjustment of medications.  HIV/AIDS: Continue HAART therapy. Repeated CD4 count outpatient. Infectious disease consulted for adjustment in HIV medications. Patient has been getting medication through NG tube.  Uncontrolled diabetes mellitus type 2: Hb A1c of 7.7.  Blood glucose fairly controlled,   continue long-acting insulin plus sliding scale.  Hyperkalemia: Resolved.   DVT prophylaxis: Lovenox Code Status: Full Family Communication: None at bedside today.  Disposition Plan:     Status is: Inpatient  Remains inpatient appropriate because:Inpatient level of care appropriate due to  severity of illness   Dispo: The patient is from: Home              Anticipated d/c is to: SNF              Anticipated d/c date is: > 3 days              Patient currently is not medically stable to d/c.   Consultants:   Neurology and psychiatry  Procedures:  None Antimicrobials:  Anti-infectives (From admission, onward)   Start     Dose/Rate Route Frequency Ordered Stop   08/10/20 1000  abacavir-dolutegravir-lamiVUDine (TRIUMEQ) 600-50-300 MG per tablet 1 tablet        1 tablet Oral Every morning 08/09/20 1436     08/09/20 2200  dolutegravir (TIVICAY) tablet 50 mg        50 mg Oral Daily at bedtime 08/09/20 1436     08/08/20 2200  dolutegravir (TIVICAY) tablet 50 mg  Status:  Discontinued        50 mg Oral As directed 08/08/20 1118 08/08/20 1227   08/08/20 2200  dolutegravir (TIVICAY) tablet 50 mg  Status:  Discontinued        50 mg Oral Daily 08/08/20 1227 08/09/20 1436   08/08/20 1215  abacavir-dolutegravir-lamiVUDine (TRIUMEQ) 600-50-300 MG per tablet 1 tablet  Status:  Discontinued        1 tablet Oral Daily 08/08/20 1118 08/09/20 1436   08/02/20 1000  bictegravir-emtricitabine-tenofovir AF (BIKTARVY) 50-200-25 MG per tablet 1 tablet  Status:  Discontinued        1 tablet Oral Daily 08/01/20 1114 08/08/20 1118   07/31/20 1000  dolutegravir (TIVICAY) tablet 50 mg  Status:  Discontinued       "And" Linked Group Details   50 mg Per Tube Daily 07/30/20 1023 08/01/20 1121   07/31/20 1000  emtricitabine-tenofovir AF (DESCOVY) 200-25 MG per tablet 1 tablet  Status:  Discontinued       "And" Linked Group Details   1 tablet Per Tube Daily 07/30/20 1023 08/01/20 1121   07/21/20 1000  bictegravir-emtricitabine-tenofovir AF (BIKTARVY) 50-200-25 MG per tablet 1 tablet  Status:  Discontinued        1 tablet Oral Daily 07/21/20 0058 07/30/20 1023     Subjective: Patient  interviewed and examined along with her female RN in room.  Patient somnolent, arousable, briefly opens eyes and drifts back to sleep this morning.  As per RN, report as documented above.  Did receive a dose of Ativan at around 11 PM last night  Objective: Vitals:   08/09/20 1946 08/10/20 0543 08/10/20 0802 08/10/20 1330  BP: 135/81 122/83 123/68 131/84  Pulse: 77 75 71 81  Resp: 18 18 18 17   Temp: 98.3 F (36.8 C) 98.2 F (36.8 C) 98.1 F (36.7 C) 97.8 F (36.6 C)  TempSrc: Oral Oral Oral Oral  SpO2: 100% 99% 99% 100%  Weight:      Height:        Intake/Output Summary (Last 24 hours) at 08/10/2020 1936 Last data filed at 08/10/2020 1548 Gross per 24 hour  Intake 440 ml  Output 700 ml  Net -260 ml   Filed Weights   08/02/20 0441 08/03/20 0405 08/04/20 0413  Weight: 84.6 kg 86.4 kg 85.5 kg    Examination:  General exam: Young female, moderately built and obese lying comfortably supine in bed without distress. Respiratory system: Clear to auscultation. Respiratory effort normal. Cardiovascular system: S1 & S2 heard, RRR.  No JVD, murmurs, rubs, gallops or clicks. No pedal edema. Gastrointestinal system: Abdomen is nondistended, soft and nontender. No organomegaly or masses felt. Normal bowel sounds heard. Central nervous system: Mental status as noted above. No focal neurological deficits. Extremities: No edema, no cyanosis, no clubbing.  Moves all extremities symmetrically.  Left upper extremity restraints. ENT: Left nostril NG tube in place. Skin: No rashes, lesions or ulcers Psychiatry: Hallucinations ++, ongoing as per nursing report, denies suicidal,  homicidal ideations.    Data Reviewed: I have personally reviewed following labs and imaging studies  CBC: Recent Labs  Lab 08/04/20 0327 08/05/20 0110 08/08/20 0425 08/09/20 0408  WBC 10.1 7.5 4.7 6.7  HGB 13.4 13.7 11.7* 13.6  HCT 41.7 42.4 37.0 42.7  MCV 89.9 88.7 89.6 89.1  PLT 318 367 124* 425*   Basic  Metabolic Panel: Recent Labs  Lab 08/04/20 0327 08/05/20 0110 08/08/20 0425 08/09/20 0408  NA 139 140 138 140  K 4.2 4.0 4.6 4.1  CL 97* 98 96* 98  CO2 31 32 30 31  GLUCOSE 176* 127* 165* 145*  BUN 14 12 15 15   CREATININE 0.78 0.77 0.69 0.77  CALCIUM 8.9 9.3 9.4 9.4  MG 2.2 2.2 2.0 2.1  PHOS 4.5 3.8 4.5 4.6   GFR: Estimated Creatinine Clearance: 84.5 mL/min (by C-G formula based on SCr of 0.77 mg/dL). Liver Function Tests: Recent Labs  Lab 08/04/20 0327 08/05/20 0110 08/08/20 0425  AST 24 21 29   ALT 21 20 22   ALKPHOS 78 73 77  BILITOT 0.4 0.6 0.7  PROT 5.8* 6.1* 6.3*  ALBUMIN 2.7* 2.8* 2.9*   CBG: Recent Labs  Lab 08/10/20 0047 08/10/20 0603 08/10/20 0828 08/10/20 1200 08/10/20 1632  GLUCAP 131* 137* 127* 157* 115*   Radiology Studies: No results found.  Scheduled Meds: .  stroke: mapping our early stages of recovery book   Does not apply Once  . abacavir-dolutegravir-lamiVUDine  1 tablet Oral q AM  . amLODipine  10 mg Per Tube Daily  . chlorhexidine  15 mL Mouth Rinse BID  . Chlorhexidine Gluconate Cloth  6 each Topical Daily  . cloNIDine  0.2 mg Per Tube BID  . dolutegravir  50 mg Oral QHS  . enoxaparin (LOVENOX) injection  40 mg Subcutaneous Daily  . insulin aspart  0-20 Units Subcutaneous Q4H  . insulin detemir  15 Units Subcutaneous BID  . lacosamide  150 mg Per Tube BID  . lactulose  20 g Per Tube TID  . levETIRAcetam  1,500 mg Per Tube BID  . mouth rinse  15 mL Mouth Rinse q12n4p  . multivitamin with minerals  1 tablet Per Tube Daily  . mupirocin ointment   Nasal BID  . pantoprazole sodium  40 mg Per Tube QHS  . PHENObarbital  64.8 mg Per Tube BID  . QUEtiapine  200 mg Per Tube BID  . sertraline  50 mg Per Tube QHS  . triamterene-hydrochlorothiazide  1 tablet Per Tube Daily  . valproic acid  500 mg Per Tube Q8H   Continuous Infusions: . feeding supplement (OSMOLITE 1.5 CAL)       LOS: 19 days    Time spent: 25 mins    Vernell Leep, MD, [place], [facility]. [facility]  To contact the attending provider between 7A-7P or the covering provider during after hours 7P-7A, please log into the web site [email or web address] and access using universal [place] password for that web site. If you do not have the password,  please call the hospital operator.

## 2020-08-10 NOTE — Progress Notes (Signed)
Patient awake and actively responding to internal stimulation, she is have a full conversation in her room alone.

## 2020-08-10 NOTE — Progress Notes (Signed)
Nutrition Follow-up  DOCUMENTATION CODES:   Not applicable  INTERVENTION:   -Continue Magic cup TID with meals, each supplement provides 290 kcal and 9 grams of protein -Continue Hormel Shake TID with meals, each supplements provides 520 kcals and 22 grams protein -Continue feeding assistance with meals -D/c 45 ml Prosource TF daily -Initiate 48 hour calorie count -Transition to nocturnal feedings:  Initiate Osmolite 1.5 @ 60 ml/hr via NGT over 12 hour period (2300-0900)  Tube feeding regimen provides 1080 kcal (58% of needs), 45 grams of protein, and 549 ml of H2O.   -If pt remains unable to demonstrate adequate oral intake via PO route, may need to consider permanent feeding access (ex PEG)  NUTRITION DIAGNOSIS:   Inadequate oral intake related to lethargy/confusion, acute illness as evidenced by NPO status.  Ongoing  GOAL:   Patient will meet greater than or equal to 90% of their needs  Progressing   MONITOR:   PO intake, Supplement acceptance, Diet advancement, Labs, Weight trends, Skin, I & O's  REASON FOR ASSESSMENT:   Consult Enteral/tube feeding initiation and management  ASSESSMENT:   59 yo female admitted with acute encephalopathy secondary to PRES and seizures, pt has not been taking psych meds at home. PMH includes DM, HIV, CVA, schizoaffective disorder, bipolar disorder, hx of trach/PEG which has been removed  11/19 Admitted 11/20 Seizure 11/26 MRI concerning for PRES, NG tube placed, TF protocol ordered 11/30- advanced to dysphagia 1 diet with thin liquids 12/2- TF re-started 12/3- s/p BSE- advanced to dysphagia 3 diet with thin liquids  Reviewed I/O's: -600 ml x 24 hours and 3.4 L since 07/27/20  UOP: 900 ml x 24 hours  Per chart review, pt remains pleasantly confused. Unable to provide accurate history at this time.   Pt diet has been upgraded to a dysphagia 3 diet with thin liquids. Per SLP, pt tolerating diet consistency well. However, meal  completion remains poor; noted PO 10%.   Pt continues to receive TF that meets 100% of nutritional needs via NGT at this time. RD will transition to nocturnal feedings and start calorie count to determine if pt will be able to demonstrate adequate PO intake. If pt remains unable to demonstrate adequate oral intake via PO route, may need to consider permanent feeding access (ex PEG).   Per pharmacy notes, due to Tivicay, enteral feedings must be administered either 2 hours before dose or hours after dose. Will modify feeding schedule to reflect these changes.   Medications reviewed and include tivicay. Keppra, and lactulose.    Labs reviewed: CBGS: 119-137 (inpatient orders for glycemic control are 0-20 units insulin aspart every 4 hours and 15 units insulin detemir daily).   Diet Order:   Diet Order            DIET DYS 3 Room service appropriate? Yes with Assist; Fluid consistency: Thin  Diet effective now                 EDUCATION NEEDS:   Not appropriate for education at this time  Skin:  Skin Assessment: Reviewed RN Assessment  Last BM:  08/09/20  Height:   Ht Readings from Last 1 Encounters:  07/21/20 5\' 6"  (1.676 m)    Weight:   Wt Readings from Last 1 Encounters:  08/04/20 85.5 kg    Ideal Body Weight:  59.1 kg  BMI:  Body mass index is 30.42 kg/m.  Estimated Nutritional Needs:   Kcal:  1850-2050  Protein:  105-120  grams  Fluid:  > 1.8 L    Loistine Chance, RD, LDN, [place] Registered Dietitian II Certified Diabetes Care and Education Specialist Please refer to [facility] for RD and/or RD on-call/weekend/after hours pager

## 2020-08-11 LAB — GLUCOSE, CAPILLARY
Glucose-Capillary: 116 mg/dL — ABNORMAL HIGH (ref 70–99)
Glucose-Capillary: 185 mg/dL — ABNORMAL HIGH (ref 70–99)
Glucose-Capillary: 108 mg/dL — ABNORMAL HIGH (ref 70–99)
Glucose-Capillary: 206 mg/dL — ABNORMAL HIGH (ref 70–99)
Glucose-Capillary: 101 mg/dL — ABNORMAL HIGH (ref 70–99)

## 2020-08-11 MED ORDER — BUSPIRONE HCL 15 MG PO TABS
15.0000 mg | ORAL_TABLET | Freq: Three times a day (TID) | ORAL | Status: DC
  Administered 2020-08-11 – 2020-08-12 (×3): 15 mg
  Filled 2020-08-11 (×3): qty 1

## 2020-08-11 MED ORDER — OLANZAPINE 5 MG PO TABS
5.0000 mg | ORAL_TABLET | Freq: Two times a day (BID) | ORAL | Status: DC
  Administered 2020-08-11 – 2020-08-17 (×12): 5 mg via ORAL
  Filled 2020-08-11 (×12): qty 1

## 2020-08-11 MED ORDER — DIPHENHYDRAMINE HCL 50 MG/ML IJ SOLN
25.0000 mg | Freq: Once | INTRAMUSCULAR | Status: AC
  Administered 2020-08-11: 25 mg via INTRAMUSCULAR
  Filled 2020-08-11: qty 1

## 2020-08-11 MED ORDER — HALOPERIDOL LACTATE 5 MG/ML IJ SOLN
5.0000 mg | Freq: Once | INTRAMUSCULAR | Status: AC
  Administered 2020-08-11: 5 mg via INTRAMUSCULAR
  Filled 2020-08-11: qty 1

## 2020-08-11 MED ORDER — VALPROIC ACID 250 MG/5ML PO SOLN
1000.0000 mg | Freq: Two times a day (BID) | ORAL | Status: DC
  Administered 2020-08-11 – 2020-08-12 (×2): 1000 mg
  Filled 2020-08-11 (×4): qty 20

## 2020-08-11 MED ORDER — OSMOLITE 1.5 CAL PO LIQD
600.0000 mL | ORAL | Status: DC
  Administered 2020-08-11: 600 mL

## 2020-08-11 MED ORDER — SERTRALINE HCL 100 MG PO TABS
100.0000 mg | ORAL_TABLET | Freq: Every day | ORAL | Status: DC
  Administered 2020-08-11: 100 mg
  Filled 2020-08-11: qty 1

## 2020-08-11 NOTE — TOC Initial Note (Signed)
Transition of Care [facility]) - Initial/Assessment Note    Patient Details  Name: Susan [name] MRN: [id number] Date of Birth: 14-Mar-1961  Transition of Care [facility]) CM/SW Contact:    Emeterio Reeve, [place] Phone Number: 08/11/2020, 1:27 PM  Clinical Narrative:                  CSW spoke to pts daughter [name] on phone. CSW introduced self and explained her role at the hospital.  [name] stated PTA pt lived at home with her. PT receives an aid for 4 hours, 7 days a week. Pt uses a Programmer, multimedia. Pt has a wheelchair but no longer uses it. Pt is independent with ADL's. Aquaina fixes all of her meals and pt feeds herself.   CSW reviewed PT/OT reccs. [name] is open to SNF and that pt has been to [facility] in the past. [name] is open to facilities in the are and understands pt may be difficult to place.   Pt is not covid vaccinated. [name] wants pt to receive Wynetta Emery and [employer] 19 vaccination before leaving the hospital. CSW informed MD.  TOC to follow.   Expected Discharge Plan: Skilled Nursing Facility Barriers to Discharge: Continued Medical Work up   Patient Goals and CMS Choice Patient states their goals for this hospitalization and ongoing recovery are:: Daughter states goals are for mother to get better before returning home CMS Medicare.gov Compare Post Acute Care list provided to:: Patient Choice offered to / list presented to : Patient  Expected Discharge Plan and Services Expected Discharge Plan: [place] In-house Referral: NA Discharge Planning Services: CM Consult Post Acute Care Choice: NA Living arrangements for the past 2 months: Single Family Home                 DME Arranged: N/A DME Agency: NA       HH Arranged: NA HH Agency: NA        Prior Living Arrangements/Services Living arrangements for the past 2 months: Single Family Home Lives with:: Adult Children Patient language and need for interpreter reviewed:: Yes Do you feel safe going  back to the place where you live?:  (patient is unable to answer)      Need for Family Participation in Patient Care: Yes (Comment) Care giver support system in place?: Yes (comment) Current home services: DME,Homehealth aide Criminal Activity/Legal Involvement Pertinent to Current Situation/Hospitalization: No - Comment as needed  Activities of Daily Living Home Assistive Devices/Equipment: None ADL Screening (condition at time of admission) Patient's cognitive ability adequate to safely complete daily activities?: No Is the patient deaf or have difficulty hearing?: No Does the patient have difficulty seeing, even when wearing glasses/contacts?: No Does the patient have difficulty concentrating, remembering, or making decisions?: Yes Patient able to express need for assistance with ADLs?: No Does the patient have difficulty dressing or bathing?: Yes Independently performs ADLs?: No Communication: Needs assistance Is this a change from baseline?: Change from baseline, expected to last <3 days Dressing (OT): Needs assistance Is this a change from baseline?: Change from baseline, expected to last <3days Grooming: Needs assistance Is this a change from baseline?: Change from baseline, expected to last <3 days Feeding: Needs assistance Is this a change from baseline?: Change from baseline, expected to last <3 days Bathing: Needs assistance Is this a change from baseline?: Change from baseline, expected to last <3 days Toileting: Needs assistance Is this a change from baseline?: Change from baseline, expected to last <3 days  In/Out Bed: Needs assistance Is this a change from baseline?: Change from baseline, expected to last <3 days Walks in Home: Needs assistance Is this a change from baseline?: Change from baseline, expected to last <3 days Does the patient have difficulty walking or climbing stairs?: Yes Weakness of Legs: Both Weakness of Arms/Hands: None  Permission  Sought/Granted Permission sought to share information with :  (patient is confused unable to give consent) Permission granted to share information with : Yes, Verbal Permission Granted     Permission granted to share info w AGENCY: SNF  Permission granted to share info w Relationship: Daughter     Emotional Assessment Appearance:: Appears stated age Attitude/Demeanor/Rapport: Other (comment) (confused) Affect (typically observed): Unable to Assess Orientation: :  (dissoriented x4) Alcohol / Substance Use: Not Applicable Psych Involvement: No (comment)  Admission diagnosis:  Altered mental status [D22.02] Metabolic encephalopathy [R42.70] Patient Active Problem List   Diagnosis Date Noted  . PRES (posterior reversible encephalopathy syndrome) 07/31/2020  . Palliative care encounter   . Metabolic encephalopathy 62/37/6283  . Altered mental status 07/20/2020  . Encephalopathy 04/02/2020  . Acute metabolic encephalopathy 15/17/6160  . Muscle spasm 04/01/2020  . Acute encephalopathy 03/08/2020  . Palliative care by specialist   . Adult failure to thrive   . Hx of completed stroke 11/24/2019  . Acute kidney injury (AKI) with acute tubular necrosis (ATN) (HCC)   . Aortic valve endocarditis 11/14/2019  . Recurrent falls 10/15/2019  . Constipation 02/26/2019  . Schizophrenia ([place]) 02/18/2019  . AKI (acute kidney injury) ([place]) 02/17/2019  . Chronic respiratory failure ([place]) 02/16/2019  . Right hip pain 02/21/2018  . DNR (do not resuscitate) discussion 08/23/2017  . Weakness generalized 06/24/2017  . Osteoarthritis 07/14/2016  . Abnormal hemoglobin ([place]) 04/18/2016  . Hot flashes 10/16/2015  . Human immunodeficiency virus (HIV) disease ([place]) 10/05/2015  . PTSD (post-traumatic stress disorder) 05/12/2015  . MDD (major depressive disorder), recurrent episode, moderate ([place]) 05/11/2015  . Cannabis use disorder, severe, dependence ([place]) 05/11/2015  . Tobacco use disorder 05/11/2015   . Uncontrolled type 2 diabetes mellitus with hyperglycemia, with long-term current use of insulin ([place]) 04/18/2015  . Diabetes mellitus type 2 without retinopathy ([place]) 01/28/2015  . Hereditary and idiopathic peripheral neuropathy 06/03/2013  . Dysfunctional uterine bleeding 06/04/2012  . Dyslipidemia   . GERD (gastroesophageal reflux disease)   . Schizoaffective disorder, bipolar type ([place]) 04/26/2011  . Morbid obesity ([place]) 04/26/2011  . HIV disease ([place]) 03/28/2011  . Essential hypertension 03/28/2011   PCP:  Hoyt Koch, MD Pharmacy:   [facility] [place], [address] [address] Phone: [phone] Fax: [phone]     Social Determinants of Health (SDOH) Interventions    Readmission Risk Interventions Readmission Risk Prevention Plan 03/11/2020 02/19/2019  Transportation Screening Complete Complete  Medication Review (RN Care Manager) Referral to Pharmacy Complete  PCP or Specialist appointment within 3-5 days of discharge Complete Complete  HRI or [place] Complete Complete  SW Recovery Care/Counseling Consult Complete Complete  Palliative Care Screening Not Applicable Not [place] Complete Not Applicable  Some recent data might be hidden   Emeterio Reeve, Latanya Presser, [place] Worker [phone]

## 2020-08-11 NOTE — Progress Notes (Signed)
Calorie Count Note  48 hour calorie count ordered.  Diet: dysphagia 3 diet with thin liquids Supplements: Magic cup TID with meals, each supplement provides 290 kcal and 9 grams of protein; Hormel Shake TID with meals, each supplements provides 520 kcals and 22 grams protein  Pt pleasantly confused. When RD greeted her, she stated "Come get a pillow and sit with me and watch Christmas movies". Pt states she is hungry, but does not think she has eaten anything today.   Case discussed with pharmacist. HIV medications are dosed at 0900 and 2200 daily. RD adjusted TF regimen to prevent drug and nutrient interactions.   08/11/20 Breakfast: 430 kcals, 13 grams protein Lunch: 326 kcals, 18 grams protein Dinner: 127 kcals, 7 grams protein  Total intake: 883 kcal (48% of minimum estimated needs)  38 grams protein (36% of minimum estimated needs)  08/12/20 Breakfast: 801 kcals, 17 grams protein Lunch: 631 kcals, 23 grams protein  Total intake: 1432 kcal (77% of minimum estimated needs)  29 grams protein (28% of minimum estimated needs)  Nutrition Dx: Inadequate oral intake related to lethargy/confusion, acute illness as evidenced by NPO status; ongoing  Goal: Patient will meet greater than or equal to 90% of their needs; progressing   Intervention:   -Continue Magic cup TID with meals, each supplement provides 290 kcal and 9 grams of protein -Continue Hormel Shake TID with meals, each supplements provides 520 kcals and 22 grams protein -Continue feeding assistance with meals -Continue 48 hour calorie count -Continue nocturnal feedings:  Osmolite 1.5 @ 75 ml/hr via NGT over 10 hour period (2300-0900)  Tube feeding regimen provides 1125 kcal (61% of needs), 47 grams of protein, and 572 ml of H2O.   -If pt remains unable to demonstrate adequate oral intake via PO route, may need to consider permanent feeding access (ex PEG)  Loistine Chance, RD, LDN, [place] Registered Dietitian  II Certified Diabetes Care and Education Specialist Please refer to [facility] for RD and/or RD on-call/weekend/after hours pager

## 2020-08-11 NOTE — Progress Notes (Signed)
PROGRESS NOTE    Susan [name]  XVQ:[id number] DOB: [birth year] DOA: 07/20/2020 PCP: Hoyt Koch, MD    Brief Narrative: Susan [name] is a 59 y.o. female with extensive past medical history,  schizoaffective disorder / bipolar disorder, seizures, essential hypertension, diabetes mellitus type 2, HIV,  polysubstance abuse and history of stroke in August 2021 notable for prolonged hospitalization,  developed sepsis with respiratory failure during that time.  She had trach and PEG which is now removed,  comes into the hospital for acute encephalopathy on 07/29/2020 .  There was a concern for stroke. Patient was admitted for acute metabolic encephalopathy possibly multifactorial,  could be secondary to seizures / hypertension /medication induced. EEG is consistent with status epilepticus , seizures are controlled.  Psych medications resumed.  PT recommended SNF  Assessment & Plan:   Active Problems:   Essential hypertension   Schizoaffective disorder, bipolar type (HCC)   GERD (gastroesophageal reflux disease)   Uncontrolled type 2 diabetes mellitus with hyperglycemia, with long-term current use of insulin (HCC)   Human immunodeficiency virus (HIV) disease (HCC)   Metabolic encephalopathy   PRES (posterior reversible encephalopathy syndrome)   Palliative care encounter   Acute metabolic encephalopathy secondary to seizures / accelerated hypertension: She was started on Cardene on admission, her MRI of the brain was compatible with PRES. She has been weaned off Cardene. Continue Norvasc, clonidine and Maxide.  Blood pressures controlled. Ongoing mental status changes.  Unclear how much of this is due to encephalopathy versus baseline psychiatry issues. Palliative care consulted to discuss with family goals of care , family wants to be full code and aggressive care. Mental status better in some ways compared to yesterday morning and not in other ways.  Patient much more alert this  morning.  But as the day went by, progressively yelling, ongoing hallucinations.  RN reported much improved oral intake all day today. I discussed in detail with psychiatry, consulted again, adding some IM as needed meds now for agitation and will see her formally in the morning.  Check EKG, QTC 450 ms.  Psychiatry indicated that since patient's psych meds had been held for some time and recently started, gradually being titrated and may take some time to take effect.  New onset epilepsy/chronic stroke: Continuous EEG showed status epilepticus. She is currently on phenobarbital, Keppra, Vimpat and Depakote. Neurology were on board,  no further seizure events. Continue seizure precautions. Neurology recommended increasing Keppra if there is further seizures,  continue others at same dosing.  Dysphagia: Patient initially failed swallowing evaluation. Per speech therapy follow-up 12/7, dysphagia 3 diet and thin liquids. Family is very reluctant and they will probably want everything done for her including a PEG tube. As per nursing report, patient oral intake has improved today.  Will monitor overnight and if remains consistent, then can try pulling out NG tube tomorrow.  History of schizophrenia/schizoaffective disorder/bipolar: Continue BuSpar, neurology recommended to restart Depakote, Seroquel, Vistaril and Zoloft.  Hold Cogentin 11/29 she had labile mood,  crying and screaming.   Psychiatry was consulted recommended to restart her Depakote Seroquel, Cogentin once more awake. Patient was agitated restless and yelling , developed  hallucinations. Psychiatry reconsulted recommended discontinue Haldol and start saphris twice daily. Patient is actively hallucinating, not imminent danger to self or others. Psychiatry resumed all her home medications for her schizophrenia. Please see detailed discussion above regarding AMS and discussion with psychiatry today  HIV/AIDS: Continue HAART  therapy. Repeated CD4 count outpatient. Infectious  disease consulted for adjustment in HIV medications. Patient has been getting medication through NG tube.  Uncontrolled diabetes mellitus type 2: Hb A1c of 7.7.  Blood glucose fairly controlled,   continue long-acting insulin plus sliding scale.  Hyperkalemia: Resolved.   DVT prophylaxis: Lovenox Code Status: Full Family Communication: None at bedside today.  Disposition Plan:    Status is: Inpatient  Remains inpatient appropriate because:Inpatient level of care appropriate due to severity of illness   Dispo: The patient is from: Home              Anticipated d/c is to: SNF              Anticipated d/c date is: > 3 days              Patient currently is not medically stable to d/c.   Consultants:   Neurology and psychiatry  Procedures:  None Antimicrobials:  Anti-infectives (From admission, onward)   Start     Dose/Rate Route Frequency Ordered Stop   08/10/20 1000  abacavir-dolutegravir-lamiVUDine (TRIUMEQ) 600-50-300 MG per tablet 1 tablet        1 tablet Oral Every morning 08/09/20 1436     08/09/20 2200  dolutegravir (TIVICAY) tablet 50 mg        50 mg Oral Daily at bedtime 08/09/20 1436     08/08/20 2200  dolutegravir (TIVICAY) tablet 50 mg  Status:  Discontinued        50 mg Oral As directed 08/08/20 1118 08/08/20 1227   08/08/20 2200  dolutegravir (TIVICAY) tablet 50 mg  Status:  Discontinued        50 mg Oral Daily 08/08/20 1227 08/09/20 1436   08/08/20 1215  abacavir-dolutegravir-lamiVUDine (TRIUMEQ) 600-50-300 MG per tablet 1 tablet  Status:  Discontinued        1 tablet Oral Daily 08/08/20 1118 08/09/20 1436   08/02/20 1000  bictegravir-emtricitabine-tenofovir AF (BIKTARVY) 50-200-25 MG per tablet 1 tablet  Status:  Discontinued        1 tablet Oral Daily 08/01/20 1114 08/08/20 1118   07/31/20 1000  dolutegravir (TIVICAY) tablet 50 mg  Status:  Discontinued       "And" Linked Group Details   50 mg Per  Tube Daily 07/30/20 1023 08/01/20 1121   07/31/20 1000  emtricitabine-tenofovir AF (DESCOVY) 200-25 MG per tablet 1 tablet  Status:  Discontinued       "And" Linked Group Details   1 tablet Per Tube Daily 07/30/20 1023 08/01/20 1121   07/21/20 1000  bictegravir-emtricitabine-tenofovir AF (BIKTARVY) 50-200-25 MG per tablet 1 tablet  Status:  Discontinued        1 tablet Oral Daily 07/21/20 0058 07/30/20 1023     Subjective: Today's events as noted above.  Patient widely awake and alert.  As I walked in the room, started yelling, "where have you been?",  "What have you done with my kids, I want to see my kids I miss my kids, I miss my kids".  She then spontaneously broke into crying and then when I was getting ready to leave, started laughing and stated "do not leave".  As per nursing report, patient responding to internal stimulation, having full conversation in her room alone.  Social work updated that patient's mother wanted patient to get J&J COVID vaccine, reviewed with my [place] partner, no contraindications, recommended to proceed.  Objective: Vitals:   08/10/20 1330 08/10/20 1946 08/11/20 0350 08/11/20 1504  BP: 131/84 126/68 137/73 (!) 135/108  Pulse:  81 84 72 93  Resp: 17 17 16 17   Temp: 97.8 F (36.6 C) 98.4 F (36.9 C) 98.2 F (36.8 C) 98.2 F (36.8 C)  TempSrc: Oral Oral Oral Oral  SpO2: 100% 100% 100% 100%  Weight:      Height:        Intake/Output Summary (Last 24 hours) at 08/11/2020 1624 Last data filed at 08/11/2020 1236 Gross per 24 hour  Intake 1243.5 ml  Output 1250 ml  Net -6.5 ml   Filed Weights   08/02/20 0441 08/03/20 0405 08/04/20 0413  Weight: 84.6 kg 86.4 kg 85.5 kg    Examination:  General exam: Young female, moderately built and obese lying comfortably supine in bed without distress. Respiratory system: Clear to auscultation. Respiratory effort normal. Cardiovascular system: S1 & S2 heard, RRR. No JVD, murmurs, rubs, gallops or clicks. No pedal  edema. Gastrointestinal system: Abdomen is nondistended, soft and nontender. No organomegaly or masses felt. Normal bowel sounds heard. Central nervous system: Mental status as noted above. No focal neurological deficits. Extremities: No edema, no cyanosis, no clubbing.  Moves all extremities symmetrically.  Left upper extremity restraints have been off for 24 hours per nursing. ENT: Left nostril NG tube in place. Skin: No rashes, lesions or ulcers Psychiatry: Hallucinations ++, ongoing as per nursing report, denies suicidal,  homicidal ideations.    Data Reviewed: I have personally reviewed following labs and imaging studies  CBC: Recent Labs  Lab 08/05/20 0110 08/08/20 0425 08/09/20 0408  WBC 7.5 4.7 6.7  HGB 13.7 11.7* 13.6  HCT 42.4 37.0 42.7  MCV 88.7 89.6 89.1  PLT 367 124* 938*   Basic Metabolic Panel: Recent Labs  Lab 08/05/20 0110 08/08/20 0425 08/09/20 0408  NA 140 138 140  K 4.0 4.6 4.1  CL 98 96* 98  CO2 32 30 31  GLUCOSE 127* 165* 145*  BUN 12 15 15   CREATININE 0.77 0.69 0.77  CALCIUM 9.3 9.4 9.4  MG 2.2 2.0 2.1  PHOS 3.8 4.5 4.6   GFR: Estimated Creatinine Clearance: 84.5 mL/min (by C-G formula based on SCr of 0.77 mg/dL). Liver Function Tests: Recent Labs  Lab 08/05/20 0110 08/08/20 0425  AST 21 29  ALT 20 22  ALKPHOS 73 77  BILITOT 0.6 0.7  PROT 6.1* 6.3*  ALBUMIN 2.8* 2.9*   CBG: Recent Labs  Lab 08/10/20 1944 08/10/20 2349 08/11/20 0348 08/11/20 0748 08/11/20 1159  GLUCAP 143* 116* 116* 185* 108*   Radiology Studies: No results found.  Scheduled Meds: .  stroke: mapping our early stages of recovery book   Does not apply Once  . abacavir-dolutegravir-lamiVUDine  1 tablet Oral q AM  . amLODipine  10 mg Per Tube Daily  . busPIRone  15 mg Per Tube TID  . chlorhexidine  15 mL Mouth Rinse BID  . Chlorhexidine Gluconate Cloth  6 each Topical Daily  . cloNIDine  0.2 mg Per Tube BID  . diphenhydrAMINE  25 mg Intramuscular Once  .  dolutegravir  50 mg Oral QHS  . enoxaparin (LOVENOX) injection  40 mg Subcutaneous Daily  . haloperidol lactate  5 mg Intramuscular Once  . insulin aspart  0-20 Units Subcutaneous Q4H  . insulin detemir  15 Units Subcutaneous BID  . lacosamide  150 mg Per Tube BID  . lactulose  20 g Per Tube TID  . levETIRAcetam  1,500 mg Per Tube BID  . mouth rinse  15 mL Mouth Rinse q12n4p  . multivitamin with minerals  1 tablet Per Tube Daily  . mupirocin ointment   Nasal BID  . OLANZapine  5 mg Oral BID  . pantoprazole sodium  40 mg Per Tube QHS  . PHENObarbital  64.8 mg Per Tube BID  . sertraline  100 mg Per Tube QHS  . triamterene-hydrochlorothiazide  1 tablet Per Tube Daily  . valproic acid  1,000 mg Per Tube BID   Continuous Infusions: . feeding supplement (OSMOLITE 1.5 CAL)       LOS: 20 days    Time spent: 25 mins    Vernell Leep, MD, [place], [facility]. [facility]  To contact the attending provider between 7A-7P or the covering provider during after hours 7P-7A, please log into the web site [email or web address] and access using universal [place] password for that web site. If you do not have the password, please call the hospital operator.

## 2020-08-11 NOTE — Plan of Care (Signed)
  Problem: Education: Goal: Knowledge of General Education information will improve Description: Including pain rating scale, medication(s)/side effects and non-pharmacologic comfort measures Outcome: Progressing   Problem: Health Behavior/Discharge Planning: Goal: Ability to manage health-related needs will improve Outcome: Progressing   Problem: Clinical Measurements: Goal: Ability to maintain clinical measurements within normal limits will improve Outcome: Progressing Goal: Will remain free from infection Outcome: Progressing Goal: Diagnostic test results will improve Outcome: Progressing Goal: Respiratory complications will improve Outcome: Progressing Goal: Cardiovascular complication will be avoided Outcome: Progressing   Problem: Activity: Goal: Risk for activity intolerance will decrease Outcome: Progressing   Problem: Nutrition: Goal: Adequate nutrition will be maintained Outcome: Progressing   Problem: Coping: Goal: Level of anxiety will decrease Outcome: Progressing   Problem: Elimination: Goal: Will not experience complications related to bowel motility Outcome: Progressing Goal: Will not experience complications related to urinary retention Outcome: Progressing   Problem: Pain Managment: Goal: General experience of comfort will improve Outcome: Progressing   Problem: Safety: Goal: Ability to remain free from injury will improve Outcome: Progressing   Problem: Skin Integrity: Goal: Risk for impaired skin integrity will decrease Outcome: Progressing   Problem: Education: Goal: Expressions of having a comfortable level of knowledge regarding the disease process will increase Outcome: Progressing   Problem: Coping: Goal: Ability to adjust to condition or change in health will improve Outcome: Progressing Goal: Ability to identify appropriate support needs will improve Outcome: Progressing   Problem: Health Behavior/Discharge Planning: Goal:  Compliance with prescribed medication regimen will improve Outcome: Progressing   Problem: Medication: Goal: Risk for medication side effects will decrease Outcome: Progressing   Problem: Clinical Measurements: Goal: Complications related to the disease process, condition or treatment will be avoided or minimized Outcome: Progressing Goal: Diagnostic test results will improve Outcome: Progressing   Problem: Safety: Goal: Verbalization of understanding the information provided will improve Outcome: Progressing   Problem: Self-Concept: Goal: Level of anxiety will decrease Outcome: Progressing Goal: Ability to verbalize feelings about condition will improve Outcome: Progressing   Problem: Education: Goal: Knowledge of disease or condition will improve Outcome: Progressing Goal: Knowledge of secondary prevention will improve Outcome: Progressing Goal: Knowledge of patient specific risk factors addressed and post discharge goals established will improve Outcome: Progressing Goal: Individualized Educational Video(s) Outcome: Progressing   Problem: Coping: Goal: Will verbalize positive feelings about self Outcome: Progressing Goal: Will identify appropriate support needs Outcome: Progressing   Problem: Health Behavior/Discharge Planning: Goal: Ability to manage health-related needs will improve Outcome: Progressing   Problem: Self-Care: Goal: Ability to participate in self-care as condition permits will improve Outcome: Progressing Goal: Verbalization of feelings and concerns over difficulty with self-care will improve Outcome: Progressing Goal: Ability to communicate needs accurately will improve Outcome: Progressing   Problem: Nutrition: Goal: Risk of aspiration will decrease Outcome: Progressing Goal: Dietary intake will improve Outcome: Progressing
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
## 2020-08-12 LAB — GLUCOSE, CAPILLARY
Glucose-Capillary: 113 mg/dL — ABNORMAL HIGH (ref 70–99)
Glucose-Capillary: 184 mg/dL — ABNORMAL HIGH (ref 70–99)
Glucose-Capillary: 133 mg/dL — ABNORMAL HIGH (ref 70–99)
Glucose-Capillary: 125 mg/dL — ABNORMAL HIGH (ref 70–99)
Glucose-Capillary: 201 mg/dL — ABNORMAL HIGH (ref 70–99)
Glucose-Capillary: 88 mg/dL (ref 70–99)

## 2020-08-12 MED ORDER — VALPROIC ACID 250 MG/5ML PO SOLN
1000.0000 mg | Freq: Two times a day (BID) | ORAL | Status: DC
  Administered 2020-08-12: 1000 mg via ORAL
  Filled 2020-08-12 (×2): qty 20

## 2020-08-12 MED ORDER — PANTOPRAZOLE SODIUM 40 MG PO PACK
40.0000 mg | PACK | Freq: Every day | ORAL | Status: DC
  Administered 2020-08-12 – 2020-08-16 (×5): 40 mg via ORAL
  Filled 2020-08-12 (×6): qty 20

## 2020-08-12 MED ORDER — LACTULOSE 10 GM/15ML PO SOLN
20.0000 g | Freq: Three times a day (TID) | ORAL | Status: DC
  Administered 2020-08-12 – 2020-08-17 (×15): 20 g via ORAL
  Filled 2020-08-12 (×15): qty 30

## 2020-08-12 MED ORDER — BUSPIRONE HCL 15 MG PO TABS
15.0000 mg | ORAL_TABLET | Freq: Three times a day (TID) | ORAL | Status: DC
  Administered 2020-08-12 – 2020-08-17 (×15): 15 mg via ORAL
  Filled 2020-08-12 (×15): qty 1

## 2020-08-12 MED ORDER — LEVETIRACETAM 100 MG/ML PO SOLN
1500.0000 mg | Freq: Two times a day (BID) | ORAL | Status: DC
  Administered 2020-08-12 – 2020-08-16 (×8): 1500 mg via ORAL
  Filled 2020-08-12 (×8): qty 15

## 2020-08-12 MED ORDER — LACOSAMIDE 50 MG PO TABS
150.0000 mg | ORAL_TABLET | Freq: Two times a day (BID) | ORAL | Status: DC
  Administered 2020-08-12 – 2020-08-17 (×10): 150 mg via ORAL
  Filled 2020-08-12 (×10): qty 3

## 2020-08-12 MED ORDER — PROSOURCE PLUS PO LIQD
30.0000 mL | Freq: Two times a day (BID) | ORAL | Status: DC
  Administered 2020-08-12 – 2020-08-17 (×11): 30 mL via ORAL
  Filled 2020-08-12 (×11): qty 30

## 2020-08-12 MED ORDER — CLONIDINE HCL 0.2 MG PO TABS
0.2000 mg | ORAL_TABLET | Freq: Two times a day (BID) | ORAL | Status: DC
  Administered 2020-08-12 – 2020-08-17 (×10): 0.2 mg via ORAL
  Filled 2020-08-12 (×10): qty 1

## 2020-08-12 MED ORDER — PHENOBARBITAL 20 MG/5ML PO ELIX
64.8000 mg | ORAL_SOLUTION | Freq: Two times a day (BID) | ORAL | Status: DC
  Administered 2020-08-12: 64.8 mg via ORAL
  Filled 2020-08-12: qty 22.5

## 2020-08-12 MED ORDER — SERTRALINE HCL 100 MG PO TABS
100.0000 mg | ORAL_TABLET | Freq: Every day | ORAL | Status: DC
  Administered 2020-08-12 – 2020-08-16 (×5): 100 mg via ORAL
  Filled 2020-08-12 (×5): qty 1

## 2020-08-12 NOTE — Progress Notes (Addendum)
Occupational Therapy Treatment Patient Details Name: Susan [name] MRN: [id number] DOB: 11-Dec-1960 Today's Date: 08/12/2020    History of present illness Pt is a 59 y.o. female admitted 07/20/20 with AMS. MRI compatible with posterior reversible encephalopathy syndrome (PRES); chronic bilateral posterior MCA and right PCA/MCA distribution infarcts. EEG showed new onset status epilepticus. PMH includes schizoaffective/bipolar disorder, seizures, HTN, DM2, HIV, polysubstance abuse; of note, recent admissions with prolonged stay 11/11/19-01/09/20 for sepsis s/p trach/PEG and removal, and 03/2020 with stroke.   OT comments  Upon arrival, pt sleeping, supine in bed with head turned to right. Pt arousing with calming cues and smiling at therapist. Pt enjoying music ([employer]) and singing. Transitioning to EOB and sitting with Mod-Max A for support. After ~15 minutes of sitting at EOB, pt becoming slightly aggitated and restless. Return to supine with Total A +2. Pt continues to present with poor attention, awareness, orientation, and engagement impacting her occupational performance and participation. Continue to recommend dc to SNF and will continue to follow acutely as admitted.    Follow Up Recommendations  SNF;Supervision/Assistance - 24 hour    Equipment Recommendations  Other (comment) (defer to next venue)    Recommendations for Other Services  (Psych and Palliative)    Precautions / Restrictions Precautions Precautions: Fall;Other (comment) Precaution Comments: AMS; R-side hemiparesis(?); hallucinating(?)       Mobility Bed Mobility Overal bed mobility: Needs Assistance Bed Mobility: Supine to Sit;Sit to Supine     Supine to sit: +2 for physical assistance;+2 for safety/equipment;Mod assist;HOB elevated;Max assist Sit to supine: Total assist;+2 for physical assistance;+2 for safety/equipment   General bed mobility comments: Mod A +2 for managing BLEs towards EOB and  then elevating trunk. pt holding therapist's hand to pull into upright posture. Due to aggitation, total A +2 for return to supine required.  Transfers                 General transfer comment: Defer due to cognition and safety    Balance Overall balance assessment: Needs assistance Sitting-balance support: Feet supported;No upper extremity supported Sitting balance-Leahy Scale: Poor Sitting balance - Comments: Mod-Max A for sitting support                                   ADL either performed or assessed with clinical judgement   ADL Overall ADL's : Needs assistance/impaired                                       General ADL Comments: Initially, pt gitty/happy at sight of OT/PT. Smiling and singing along to music. Transitioning to EOB and sitting with Mod-Max A for support. Poor engagement and attention. Total A for ADLs. After ~15 minutes of sitting at EOB, pt becoming slightly aggitated and restless. Return to supine with Total A +2     Vision       Perception     Praxis      Cognition Arousal/Alertness: Awake/alert;Lethargic;Suspect due to medications Behavior During Therapy: Restless;Flat affect (Initially drowsy and sleepy. Progressing to happy/gitty. Transitioning then to restless and mildly aggitated.) Overall Cognitive Status: No family/caregiver present to determine baseline cognitive functioning Area of Impairment: Orientation;Attention;Following commands;Awareness;Problem solving;Safety/judgement;Memory                 Orientation Level: Place;Time;Situation;Disoriented to Current Attention Level:  Focused Memory: Decreased short-term memory Following Commands: Follows one step commands inconsistently Safety/Judgement: Decreased awareness of safety;Decreased awareness of deficits Awareness: Intellectual Problem Solving: Slow processing;Decreased initiation;Difficulty sequencing;Requires verbal cues;Requires tactile  cues General Comments: Upon arrival, pt initially happy/gitty to see therapists. Pt agreeable with sitting at EOB. Pt smiling and singing along to music. Pt making eye contact and able to track therapist. As session progress, pt becoming more restless and fearful. Pt looking over therapist's shoulder and stating "you said I'd be better. Are you better?" Pt then perseveraing on topic of her daughter. At end of session, pt saying she wants her daughter, stating she doesn't like being touched, and presenting with manic smiling and tongue flapping.        Exercises     Shoulder Instructions       General Comments      Pertinent Vitals/ Pain       Pain Assessment: Faces Faces Pain Scale: No hurt Pain Location: with tactile stimulation x 4 extremities Pain Descriptors / Indicators: Grimacing;Moaning Pain Intervention(s): Monitored during session;Limited activity within patient's tolerance;Repositioned  Home Living                                          Prior Functioning/Environment              Frequency  Min 1X/week        Progress Toward Goals  OT Goals(current goals can now be found in the care plan section)  Progress towards OT goals: Progressing toward goals  Acute Rehab OT Goals Patient Stated Goal: none stated OT Goal Formulation: Patient unable to participate in goal setting ADL Goals Pt Will Perform Grooming: with mod assist;bed level Pt Will Perform Upper Body Dressing: with set-up;sitting Pt Will Perform Lower Body Dressing: with min guard assist;sit to/from stand Pt Will Transfer to Toilet: with min guard assist;ambulating Pt Will Perform Toileting - Clothing Manipulation and hygiene: with mod assist;sit to/from stand Additional ADL Goal #1: Pt will follow one step commands 1/4 times for ADL completion Additional ADL Goal #2: Pt will perform bed mobility with max A to come EOB in preparation to participate in ADL  Plan Discharge plan  remains appropriate    Co-evaluation    PT/OT/SLP Co-Evaluation/Treatment: Yes Reason for Co-Treatment: Complexity of the patient's impairments (multi-system involvement);For patient/therapist safety;To address functional/ADL transfers;Necessary to address cognition/behavior during functional activity   OT goals addressed during session: ADL's and self-care      AM-PAC OT "6 Clicks" Daily Activity     Outcome Measure   Help from another person eating meals?: Total Help from another person taking care of personal grooming?: Total Help from another person toileting, which includes using toliet, bedpan, or urinal?: Total Help from another person bathing (including washing, rinsing, drying)?: Total Help from another person to put on and taking off regular upper body clothing?: Total Help from another person to put on and taking off regular lower body clothing?: Total 6 Click Score: 6    End of Session    OT Visit Diagnosis: Other abnormalities of gait and mobility (R26.89);Other symptoms and signs involving cognitive function;Low vision, both eyes (H54.2);Muscle weakness (generalized) (M62.81)   Activity Tolerance Treatment limited secondary to agitation   Patient Left in bed;with call bell/phone within reach;with bed alarm set   Nurse Communication Mobility status        Time: [phone]  OT Time Calculation (min): 23 min  Charges: OT General Charges $OT Visit: 1 Visit OT Treatments $Therapeutic Activity: 8-22 mins  Roy, OTR/L Acute Rehab Pager: [phone] Office: [place] 08/12/2020, 4:46 PM

## 2020-08-12 NOTE — Progress Notes (Signed)
PROGRESS NOTE    Susan [name]  ZMO:[id number] DOB: [birth year] DOA: 07/20/2020 PCP: Hoyt Koch, MD    Brief Narrative: Susan [name] is a 59 y.o. female with extensive past medical history,  schizoaffective disorder / bipolar disorder, seizures, essential hypertension, diabetes mellitus type 2, HIV,  polysubstance abuse and history of stroke in August 2021 notable for prolonged hospitalization,  developed sepsis with respiratory failure during that time.  She had trach and PEG which is now removed,  comes into the hospital for acute encephalopathy on 07/29/2020 .  There was a concern for stroke. Patient was admitted for acute metabolic encephalopathy possibly multifactorial,  could be secondary to seizures / hypertension /medication induced. EEG is consistent with status epilepticus , seizures are controlled.  Psych medications resumed.  PT recommended SNF  Assessment & Plan:   Active Problems:   Essential hypertension   Schizoaffective disorder, bipolar type (HCC)   GERD (gastroesophageal reflux disease)   Uncontrolled type 2 diabetes mellitus with hyperglycemia, with long-term current use of insulin (HCC)   Human immunodeficiency virus (HIV) disease (HCC)   Metabolic encephalopathy   PRES (posterior reversible encephalopathy syndrome)   Palliative care encounter   Acute metabolic encephalopathy secondary to seizures / accelerated hypertension:  She was started on Cardene on admission, her MRI of the brain was compatible with PRES.  She has been weaned off Cardene.  Continue Norvasc, clonidine and Maxide.  Blood pressures controlled.  Ongoing mental status changes.  Unclear how much of this is due to encephalopathy versus baseline psychiatry issues.  Palliative care consulted to discuss with family goals of care , family wants to be full code and aggressive care.  I discussed in detail with psychiatry 12/9, consulted again, adding as needed IM meds and adjusted her  regimen.  EKG, QTC 450 ms.  Psychiatry indicated that since patient's psych meds had been held for some time and recently started, gradually being titrated and may take some time to take effect.  I specifically requested psychiatry to closely follow medically 3 times a week since her predominant issue is behavioral issue at this time.  New onset epilepsy/chronic stroke:  Continuous EEG showed status epilepticus.  She is currently on phenobarbital, Keppra, Vimpat and Depakote.  Neurology were on board,  no further seizure events.  Continue seizure precautions.  Neurology recommended increasing Keppra if there is further seizures,  continue others at same dosing.  Dysphagia:  Patient initially failed swallowing evaluation.  Per speech therapy follow-up 12/7, dysphagia 3 diet and thin liquids.  Family is very reluctant and they will probably want everything done for her including a PEG tube.  As oral intake gradually improved, NG tube was discontinued 12/10.  Continue to monitor oral intake closely.  History of schizophrenia/schizoaffective disorder/bipolar:  Psychiatry has seen multiple times this admission and medications are being managed by them.  Please see the detailed discussion above.  I have specifically requested that they continue to follow patient closely at least 3 times a week for evaluation and medication management.  They verbalized understanding.  Since yesterday, agitation seems to have improved.  Patient cooperative with care this morning when I was in her room and nursing were changing her pure wick.  She continued to be confused-referring to me as her brother-in-law.  HIV/AIDS:  Continue HAART therapy.  Repeated CD4 count outpatient.  Infectious disease consulted for adjustment in HIV medications.  Uncontrolled diabetes mellitus type 2:  Hb A1c of 7.7.  Blood glucose fairly  controlled,    continue long-acting insulin plus sliding  scale.  Hyperkalemia:  Resolved.   DVT prophylaxis: Lovenox Code Status: Full Family Communication: None at bedside today.  I discussed in detail with patient's daughter via phone, updated care and answered all questions.  She is requesting periodic updates from the physician.  Disposition Plan:    Status is: Inpatient  Remains inpatient appropriate because:Inpatient level of care appropriate due to severity of illness   Dispo: The patient is from: Home              Anticipated d/c is to: SNF              Anticipated d/c date is: > 3 days              Patient currently is not medically stable to d/c.   Consultants:   Neurology and psychiatry  Procedures:  None Antimicrobials:  Anti-infectives (From admission, onward)   Start     Dose/Rate Route Frequency Ordered Stop   08/10/20 1000  abacavir-dolutegravir-lamiVUDine (TRIUMEQ) 600-50-300 MG per tablet 1 tablet        1 tablet Oral Every morning 08/09/20 1436     08/09/20 2200  dolutegravir (TIVICAY) tablet 50 mg        50 mg Oral Daily at bedtime 08/09/20 1436     08/08/20 2200  dolutegravir (TIVICAY) tablet 50 mg  Status:  Discontinued        50 mg Oral As directed 08/08/20 1118 08/08/20 1227   08/08/20 2200  dolutegravir (TIVICAY) tablet 50 mg  Status:  Discontinued        50 mg Oral Daily 08/08/20 1227 08/09/20 1436   08/08/20 1215  abacavir-dolutegravir-lamiVUDine (TRIUMEQ) 600-50-300 MG per tablet 1 tablet  Status:  Discontinued        1 tablet Oral Daily 08/08/20 1118 08/09/20 1436   08/02/20 1000  bictegravir-emtricitabine-tenofovir AF (BIKTARVY) 50-200-25 MG per tablet 1 tablet  Status:  Discontinued        1 tablet Oral Daily 08/01/20 1114 08/08/20 1118   07/31/20 1000  dolutegravir (TIVICAY) tablet 50 mg  Status:  Discontinued       "And" Linked Group Details   50 mg Per Tube Daily 07/30/20 1023 08/01/20 1121   07/31/20 1000  emtricitabine-tenofovir AF (DESCOVY) 200-25 MG per tablet 1 tablet  Status:   Discontinued       "And" Linked Group Details   1 tablet Per Tube Daily 07/30/20 1023 08/01/20 1121   07/21/20 1000  bictegravir-emtricitabine-tenofovir AF (BIKTARVY) 50-200-25 MG per tablet 1 tablet  Status:  Discontinued        1 tablet Oral Daily 07/21/20 0058 07/30/20 1023     Subjective: Patient's RN and nursing tech changing pure wick this morning.  Patient pleasantly confused and cooperative.  Referred to me as her "brother-in-law" and continues to talk nonsensically.  As per RN, oral intake has gradually improved, this was confirmed by RD later this afternoon.  No other acute issues reported.  Objective: Vitals:   08/11/20 0350 08/11/20 1504 08/11/20 1944 08/12/20 1618  BP: 137/73 (!) 135/108 (!) 144/98 (!) 95/58  Pulse: 72 93 92 87  Resp: 16 17 18 18   Temp: 98.2 F (36.8 C) 98.2 F (36.8 C) 98.7 F (37.1 C)   TempSrc: Oral Oral Oral   SpO2: 100% 100% 100% 100%  Weight:      Height:        Intake/Output Summary (Last 24 hours) at  08/12/2020 1816 Last data filed at 08/12/2020 1251 Gross per 24 hour  Intake 420 ml  Output 1000 ml  Net -580 ml   Filed Weights   08/02/20 0441 08/03/20 0405 08/04/20 0413  Weight: 84.6 kg 86.4 kg 85.5 kg    Examination:  General exam: Young female, moderately built and obese lying comfortably supine in bed without distress. Respiratory system: Clear to auscultation.  No increased work of breathing. Cardiovascular system: S1 & S2 heard, RRR. No JVD, murmurs, rubs, gallops or clicks. No pedal edema. Gastrointestinal system: Abdomen is nondistended, soft and nontender. No organomegaly or masses felt. Normal bowel sounds heard. Central nervous system: Alert and pleasantly confused. No focal neurological deficits. Extremities: No edema, no cyanosis, no clubbing.  Moves all extremities symmetrically.  No restraints noted this morning. ENT: Left nostril NG tube in place, this was still present this morning. Skin: No rashes, lesions or  ulcers Psychiatry: Unsure if still having hallucinations.    Data Reviewed: I have personally reviewed following labs and imaging studies  CBC: Recent Labs  Lab 08/08/20 0425 08/09/20 0408  WBC 4.7 6.7  HGB 11.7* 13.6  HCT 37.0 42.7  MCV 89.6 89.1  PLT 124* 834*   Basic Metabolic Panel: Recent Labs  Lab 08/08/20 0425 08/09/20 0408  NA 138 140  K 4.6 4.1  CL 96* 98  CO2 30 31  GLUCOSE 165* 145*  BUN 15 15  CREATININE 0.69 0.77  CALCIUM 9.4 9.4  MG 2.0 2.1  PHOS 4.5 4.6   GFR: Estimated Creatinine Clearance: 84.5 mL/min (by C-G formula based on SCr of 0.77 mg/dL). Liver Function Tests: Recent Labs  Lab 08/08/20 0425  AST 29  ALT 22  ALKPHOS 77  BILITOT 0.7  PROT 6.3*  ALBUMIN 2.9*   CBG: Recent Labs  Lab 08/11/20 2353 08/12/20 0449 08/12/20 0756 08/12/20 1242 08/12/20 1649  GLUCAP 133* 113* 184* 125* 201*   Radiology Studies: No results found.  Scheduled Meds: .  stroke: mapping our early stages of recovery book   Does not apply Once  . (feeding supplement) PROSource Plus  30 mL Oral BID BM  . abacavir-dolutegravir-lamiVUDine  1 tablet Oral q AM  . amLODipine  10 mg Per Tube Daily  . busPIRone  15 mg Per Tube TID  . chlorhexidine  15 mL Mouth Rinse BID  . Chlorhexidine Gluconate Cloth  6 each Topical Daily  . cloNIDine  0.2 mg Per Tube BID  . dolutegravir  50 mg Oral QHS  . enoxaparin (LOVENOX) injection  40 mg Subcutaneous Daily  . insulin aspart  0-20 Units Subcutaneous Q4H  . insulin detemir  15 Units Subcutaneous BID  . lacosamide  150 mg Per Tube BID  . lactulose  20 g Per Tube TID  . levETIRAcetam  1,500 mg Per Tube BID  . mouth rinse  15 mL Mouth Rinse q12n4p  . multivitamin with minerals  1 tablet Per Tube Daily  . mupirocin ointment   Nasal BID  . OLANZapine  5 mg Oral BID  . pantoprazole sodium  40 mg Per Tube QHS  . PHENObarbital  64.8 mg Per Tube BID  . sertraline  100 mg Per Tube QHS  . triamterene-hydrochlorothiazide  1  tablet Per Tube Daily  . valproic acid  1,000 mg Per Tube BID   Continuous Infusions:    LOS: 21 days    Time spent: 25 mins    Vernell Leep, MD, [place], [facility]. [facility]  To contact  the attending provider between 7A-7P or the covering provider during after hours 7P-7A, please log into the web site [email or web address] and access using universal [place] password for that web site. If you do not have the password, please call the hospital operator.

## 2020-08-12 NOTE — Progress Notes (Signed)
Physical Therapy Treatment Patient Details Name: Susan [name] MRN: [id number] DOB: 11-22-1960 Today's Date: 08/12/2020    History of Present Illness Pt is a 59 y.o. female admitted 07/20/20 with AMS. MRI compatible with posterior reversible encephalopathy syndrome (PRES); chronic bilateral posterior MCA and right PCA/MCA distribution infarcts. EEG showed new onset status epilepticus. PMH includes schizoaffective/bipolar disorder, seizures, HTN, DM2, HIV, polysubstance abuse; of note, recent admissions with prolonged stay 11/11/19-01/09/20 for sepsis s/p trach/PEG and removal, and 03/2020 with stroke.   PT Comments    Pt slowly progressing with mobility, demonstrating improved tolerance to sitting EOB activity. Pt arousing to therapist and quick to engage in session, but becoming increasingly restless with prolonged upright activity, unable to progress to OOB this session. Pt remains limited by generalized weakness (inconsistent RLE muscle activation, and not noted RUE activation), decreased activity tolerance, poor attention, impaired awareness and disorientation. Continue to recommend SNF-level therapies to maximize functional mobility and decrease caregiver burden.   Follow Up Recommendations  SNF;Supervision/Assistance - 24 hour     Equipment Recommendations  Wheelchair (measurements PT);Wheelchair cushion (measurements PT);Hospital bed;Other (comment) (hoyer lift)    Recommendations for Other Services       Precautions / Restrictions Precautions Precautions: Fall;Other (comment) Precaution Comments: AMS; R-side hemiparesis(?); hallucinating Restrictions Weight Bearing Restrictions: No    Mobility  Bed Mobility Overal bed mobility: Needs Assistance Bed Mobility: Supine to Sit;Sit to Supine     Supine to sit: Mod assist;+2 for physical assistance;+2 for safety/equipment;HOB elevated Sit to supine: Total assist;+2 for physical assistance;+2 for safety/equipment   General bed  mobility comments: Mod A +2 for managing BLEs towards EOB and then elevating trunk. pt holding therapist's hand to pull into upright posture. Due to aggitation/restlessness, requiring total A +2 for return to supine required.  Transfers Overall transfer level: Needs assistance               General transfer comment: Attempts to initate standing/forward weight translation with BUE support and dancing to music, but pt not engaging; deferred further attempts due to cognition/safety  Ambulation/Gait                 Stairs             Wheelchair Mobility    Modified Rankin (Stroke Patients Only)       Balance Overall balance assessment: Needs assistance Sitting-balance support: Feet supported;No upper extremity supported Sitting balance-Leahy Scale: Poor Sitting balance - Comments: Sitting EOB >/15-min fluctutating from min-maxA for sitting support                                    Cognition Arousal/Alertness: Awake/alert;Lethargic;Suspect due to medications Behavior During Therapy: Restless;Flat affect Overall Cognitive Status: No family/caregiver present to determine baseline cognitive functioning Area of Impairment: Orientation;Attention;Following commands;Awareness;Problem solving;Safety/judgement;Memory                 Orientation Level: Place;Time;Situation;Disoriented to Current Attention Level: Focused Memory: Decreased short-term memory Following Commands: Follows one step commands inconsistently Safety/Judgement: Decreased awareness of safety;Decreased awareness of deficits Awareness: Intellectual Problem Solving: Slow processing;Decreased initiation;Difficulty sequencing;Requires verbal cues;Requires tactile cues General Comments: Upon arrival, pt initially happy/gitty to see therapists. Pt agreeable with sitting at EOB. Pt smiling and singing along to music. Pt making eye contact and able to track therapist. As session progress,  pt becoming more restless and fearful. Pt looking over therapist's shoulder and stating "you said  I'd be better. Are you better?" Pt then perseveraing on topic of her daughter. At end of session, pt saying she wants her daughter, stating she doesn't like being touched, and presenting with manic smiling and tongue flapping.      Exercises      General Comments        Pertinent Vitals/Pain Pain Assessment: Faces Faces Pain Scale: No hurt Pain Location: with tactile stimulation x 4 extremities Pain Descriptors / Indicators: Grimacing;Moaning Pain Intervention(s): Monitored during session;Limited activity within patient's tolerance    Home Living                      Prior Function            PT Goals (current goals can now be found in the care plan section) Acute Rehab PT Goals Patient Stated Goal: none stated Progress towards PT goals: Progressing toward goals (slowly, remains limited by cognition/psychological impairment)    Frequency    Min 2X/week      PT Plan Current plan remains appropriate    Co-evaluation PT/OT/SLP Co-Evaluation/Treatment: Yes Reason for Co-Treatment: Complexity of the patient's impairments (multi-system involvement);Necessary to address cognition/behavior during functional activity;For patient/therapist safety;To address functional/ADL transfers PT goals addressed during session: Mobility/safety with mobility;Balance OT goals addressed during session: ADL's and self-care      AM-PAC PT "6 Clicks" Mobility   Outcome Measure  Help needed turning from your back to your side while in a flat bed without using bedrails?: A Lot Help needed moving from lying on your back to sitting on the side of a flat bed without using bedrails?: A Lot Help needed moving to and from a bed to a chair (including a wheelchair)?: Total Help needed standing up from a chair using your arms (e.g., wheelchair or bedside chair)?: Total Help needed to walk in  hospital room?: Total Help needed climbing 3-5 steps with a railing? : Total 6 Click Score: 8    End of Session   Activity Tolerance: Patient tolerated treatment well;Patient limited by fatigue;Other (comment) (cognitive/psychological impairment) Patient left: in bed;with call bell/phone within reach;with bed alarm set Nurse Communication: Mobility status PT Visit Diagnosis: Muscle weakness (generalized) (M62.81);Other abnormalities of gait and mobility (R26.89)     Time: 1420-1443 PT Time Calculation (min) (ACUTE ONLY): 23 min  Charges:  $Therapeutic Activity: 8-22 mins                     Mabeline Caras, PT, DPT Acute Rehabilitation Services  Pager [phone] Office [place] 08/12/2020, 5:15 PM

## 2020-08-12 NOTE — Social Work (Signed)
Pts passr number is [id number] F and is valid until 09/11/2020.   Emeterio Reeve, Latanya Presser, [place] Social Worker [phone]

## 2020-08-12 NOTE — Progress Notes (Signed)
Calorie Count Note  48 hour calorie count ordered.  Diet: dysphagia 3 diet with thin liquids Supplements:Magic cup TID with meals, each supplement provides 290 kcal and 9 grams of protein; Hormel Shake TID with meals, each supplements provides 520 kcals and 22 grams protein  Spoke with nurse tech, who reports pt with improved oral intake. She still requires feeding assistance with meals and tolerates diet texture well. Pt consumed 100% of breakfast this morning per her report.   Case discussed with RN and Dr. Algis Liming. Plan to d/c cortrak and TF today given improved oral intake.   08/11/20 Breakfast: 430 kcals, 13 grams protein Lunch: 326 kcals, 18 grams protein Dinner: 127 kcals, 7 grams protein  Total intake: 883 kcal (48% of minimum estimated needs)  38 grams protein (36% of minimum estimated needs)  08/12/20 Breakfast: 801 kcals, 17 grams protein Lunch: 631 kcals, 23 grams protein Dinner: 867 kcals, 33 grams protein  Total intake: 2299 kcal (124% of minimum estimated needs)  62 grams protein (59% of minimum estimated needs  Total intake: 1591 kcal (86% of minimum estimated needs)  50 grams protein (48% of minimum estimated needs)  Nutrition Dx: Inadequate oral intakerelated to lethargy/confusion, acute illnessas evidenced by NPO status; progressing   Goal: Patient will meet greater than or equal to 90% of their needs; progressing   Intervention:   -30 ml Prosource Plus, BID, each supplement provides 100 kcals and 15 grams protein -ContinueMagic cup TID with meals, each supplement provides 290 kcal and 9 grams of protein -Continue Hormel Shake TID with meals, each supplements provides 520 kcals and 22 grams protein -Continue feeding assistance with meals -D/c calorie count -D/c nocturnal feedings:  Loistine Chance, RD, LDN, [place] Registered Dietitian II Certified Diabetes Care and Education Specialist Please refer to [facility] for RD and/or RD on-call/weekend/after  hours pager

## 2020-08-13 LAB — CBC
HCT: 39.3 % (ref 36.0–46.0)
Hemoglobin: 13.4 g/dL (ref 12.0–15.0)
MCH: 29.8 pg (ref 26.0–34.0)
MCHC: 34.1 g/dL (ref 30.0–36.0)
MCV: 87.3 fL (ref 80.0–100.0)
Platelets: 319 10*3/uL (ref 150–400)
RBC: 4.5 MIL/uL (ref 3.87–5.11)
RDW: 13.7 % (ref 11.5–15.5)
WBC: 7.9 10*3/uL (ref 4.0–10.5)
nRBC: 0 % (ref 0.0–0.2)

## 2020-08-13 LAB — GLUCOSE, CAPILLARY
Glucose-Capillary: 107 mg/dL — ABNORMAL HIGH (ref 70–99)
Glucose-Capillary: 109 mg/dL — ABNORMAL HIGH (ref 70–99)
Glucose-Capillary: 139 mg/dL — ABNORMAL HIGH (ref 70–99)
Glucose-Capillary: 145 mg/dL — ABNORMAL HIGH (ref 70–99)
Glucose-Capillary: 178 mg/dL — ABNORMAL HIGH (ref 70–99)
Glucose-Capillary: 83 mg/dL (ref 70–99)

## 2020-08-13 LAB — COMPREHENSIVE METABOLIC PANEL
ALT: 20 U/L (ref 0–44)
AST: 17 U/L (ref 15–41)
Albumin: 2.7 g/dL — ABNORMAL LOW (ref 3.5–5.0)
Alkaline Phosphatase: 78 U/L (ref 38–126)
Anion gap: 9 (ref 5–15)
BUN: 18 mg/dL (ref 6–20)
CO2: 31 mmol/L (ref 22–32)
Calcium: 9.2 mg/dL (ref 8.9–10.3)
Chloride: 99 mmol/L (ref 98–111)
Creatinine, Ser: 0.7 mg/dL (ref 0.44–1.00)
GFR, Estimated: >60 mL/min (ref >60)
Glucose, Bld: 130 mg/dL — ABNORMAL HIGH (ref 70–99)
Potassium: 3.9 mmol/L (ref 3.5–5.1)
Sodium: 139 mmol/L (ref 135–145)
Total Bilirubin: 0.5 mg/dL (ref 0.3–1.2)
Total Protein: 6.3 g/dL — ABNORMAL LOW (ref 6.5–8.1)

## 2020-08-13 MED ORDER — VALPROIC ACID 250 MG PO CAPS
1000.0000 mg | ORAL_CAPSULE | Freq: Two times a day (BID) | ORAL | Status: DC
  Administered 2020-08-13 – 2020-08-17 (×9): 1000 mg via ORAL
  Filled 2020-08-13 (×10): qty 4

## 2020-08-13 MED ORDER — PHENOBARBITAL 64.8 MG PO TABS
64.8000 mg | ORAL_TABLET | Freq: Two times a day (BID) | ORAL | Status: DC
  Administered 2020-08-13 – 2020-08-17 (×9): 64.8 mg via ORAL
  Filled 2020-08-13 (×5): qty 2
  Filled 2020-08-13: qty 1
  Filled 2020-08-13 (×3): qty 2

## 2020-08-13 NOTE — Plan of Care (Signed)
  Problem: Education: Goal: Knowledge of General Education information will improve Description: Including pain rating scale, medication(s)/side effects and non-pharmacologic comfort measures Outcome: Progressing   Problem: Health Behavior/Discharge Planning: Goal: Ability to manage health-related needs will improve Outcome: Progressing   Problem: Clinical Measurements: Goal: Respiratory complications will improve Outcome: Progressing   Problem: Nutrition: Goal: Adequate nutrition will be maintained Outcome: Progressing   Problem: Elimination: Goal: Will not experience complications related to bowel motility Outcome: Progressing Goal: Will not experience complications related to urinary retention Outcome: Progressing   Problem: Pain Managment: Goal: General experience of comfort will improve Outcome: Progressing   Problem: Safety: Goal: Ability to remain free from injury will improve Outcome: Progressing   Problem: Skin Integrity: Goal: Risk for impaired skin integrity will decrease Outcome: Progressing

## 2020-08-13 NOTE — Progress Notes (Signed)
PROGRESS NOTE    CODA FILLER  VOZ:[id number] DOB: 1961-03-18 DOA: 07/20/2020 PCP: Hoyt Koch, MD    Brief Narrative: Patient is a 59 year old African-American female with extensive past medical and psychiatric history.  Patient carries diagnosis of schizoaffective disorder / bipolar disorder, seizures, essential hypertension, diabetes mellitus type 2, HIV,  polysubstance abuse and history of stroke in August 2021 notable for prolonged hospitalization,  developed sepsis with respiratory failure during that time.  She had trach and PEG which is now removed,  comes into the hospital for acute encephalopathy on 07/29/2020 .  There was a concern for stroke. Patient was admitted for acute metabolic encephalopathy possibly multifactorial,  could be secondary to seizures / hypertension /medication induced. EEG is consistent with status epilepticus , seizures are controlled.  Psych medications resumed.  PT recommended SNF.  08/13/2020: Patient seen.  Patient would not come down to talk to me.  Patient continued to shout.  Assessment & Plan:   Active Problems:   Essential hypertension   Schizoaffective disorder, bipolar type (HCC)   GERD (gastroesophageal reflux disease)   Uncontrolled type 2 diabetes mellitus with hyperglycemia, with long-term current use of insulin (HCC)   Human immunodeficiency virus (HIV) disease (HCC)   Metabolic encephalopathy   PRES (posterior reversible encephalopathy syndrome)   Palliative care encounter   Acute metabolic encephalopathy secondary to seizures / accelerated hypertension:  She was started on Cardene on admission, her MRI of the brain was compatible with PRES.  She has been weaned off Cardene.  Continue Norvasc, clonidine and Maxide.  Blood pressures controlled.  Ongoing mental status changes.  Unclear how much of this is due to encephalopathy versus baseline psychiatry issues.  Palliative care consulted to discuss with family goals of  care , family wants to be full code and aggressive care.  I discussed in detail with psychiatry 12/9, consulted again, adding as needed IM meds and adjusted her regimen.  EKG, QTC 450 ms.  Psychiatry indicated that since patient's psych meds had been held for some time and recently started, gradually being titrated and may take some time to take effect.  I specifically requested psychiatry to closely follow medically 3 times a week since her predominant issue is behavioral issue at this time. 08/13/2020: Blood pressure is optimized.  Psychiatric input to be highly appreciated.  Patient remains psychotic.  New onset epilepsy/chronic stroke:  Continuous EEG showed status epilepticus.  She is currently on phenobarbital, Keppra, Vimpat and Depakote.  Neurology were on board,  no further seizure events.  Continue seizure precautions.  Neurology recommended increasing Keppra if there is further seizures,  continue others at same dosing.  08/13/2020: No further seizures reported.  Dysphagia:  Patient initially failed swallowing evaluation.  Per speech therapy follow-up 12/7, dysphagia 3 diet and thin liquids.  Family is very reluctant and they will probably want everything done for her including a PEG tube.  As oral intake gradually improved, NG tube was discontinued 12/10.  Continue to monitor oral intake closely.  History of schizophrenia/schizoaffective disorder/bipolar:  Psychiatry has seen multiple times this admission and medications are being managed by them.  Please see the detailed discussion above.  I have specifically requested that they continue to follow patient closely at least 3 times a week for evaluation and medication management.  They verbalized understanding.  Since yesterday, agitation seems to have improved.  Patient cooperative with care this morning when I was in her room and nursing were changing her pure wick.  She continued to be confused-referring to me as her  brother-in-law.  HIV/AIDS:  Continue HAART therapy.  Repeat CD4 count outpatient.  Infectious disease consulted for adjustment of HIV medications.  Uncontrolled diabetes mellitus type 2:  Hb A1c of 7.7.  Blood glucose fairly controlled,    continue long-acting insulin plus sliding scale. 08/13/2020: Continue to optimize blood sugar control.  Hyperkalemia:  Resolved.   DVT prophylaxis: Lovenox Code Status: Full Family Communication: None at bedside today.  I discussed in detail with patient's daughter via phone, updated care and answered all questions.  She is requesting periodic updates from the physician.  Disposition Plan:    Status is: Inpatient  Remains inpatient appropriate because:Inpatient level of care appropriate due to severity of illness   Dispo: The patient is from: Home              Anticipated d/c is to: SNF              Anticipated d/c date is: > 3 days              Patient currently is not medically stable to d/c.   Consultants:   Neurology and psychiatry  Procedures:  None Antimicrobials:  Anti-infectives (From admission, onward)   Start     Dose/Rate Route Frequency Ordered Stop   08/10/20 1000  abacavir-dolutegravir-lamiVUDine (TRIUMEQ) 600-50-300 MG per tablet 1 tablet        1 tablet Oral Every morning 08/09/20 1436     08/09/20 2200  dolutegravir (TIVICAY) tablet 50 mg        50 mg Oral Daily at bedtime 08/09/20 1436     08/08/20 2200  dolutegravir (TIVICAY) tablet 50 mg  Status:  Discontinued        50 mg Oral As directed 08/08/20 1118 08/08/20 1227   08/08/20 2200  dolutegravir (TIVICAY) tablet 50 mg  Status:  Discontinued        50 mg Oral Daily 08/08/20 1227 08/09/20 1436   08/08/20 1215  abacavir-dolutegravir-lamiVUDine (TRIUMEQ) 600-50-300 MG per tablet 1 tablet  Status:  Discontinued        1 tablet Oral Daily 08/08/20 1118 08/09/20 1436   08/02/20 1000  bictegravir-emtricitabine-tenofovir AF (BIKTARVY) 50-200-25 MG per tablet  1 tablet  Status:  Discontinued        1 tablet Oral Daily 08/01/20 1114 08/08/20 1118   07/31/20 1000  dolutegravir (TIVICAY) tablet 50 mg  Status:  Discontinued       "And" Linked Group Details   50 mg Per Tube Daily 07/30/20 1023 08/01/20 1121   07/31/20 1000  emtricitabine-tenofovir AF (DESCOVY) 200-25 MG per tablet 1 tablet  Status:  Discontinued       "And" Linked Group Details   1 tablet Per Tube Daily 07/30/20 1023 08/01/20 1121   07/21/20 1000  bictegravir-emtricitabine-tenofovir AF (BIKTARVY) 50-200-25 MG per tablet 1 tablet  Status:  Discontinued        1 tablet Oral Daily 07/21/20 0058 07/30/20 1023     Subjective: -Patient remains psychotic.  Objective: Vitals:   08/11/20 1944 08/12/20 1618 08/12/20 1945 08/13/20 0332  BP: (!) 144/98 (!) 95/58 (!) 142/91 140/86  Pulse: 92 87 87 90  Resp: 18 18 17 17   Temp: 98.7 F (37.1 C)  98.8 F (37.1 C) 98.6 F (37 C)  TempSrc: Oral  Oral Oral  SpO2: 100% 100% 100% 100%  Weight:      Height:        Intake/Output Summary (  Last 24 hours) at 08/13/2020 1112 Last data filed at 08/12/2020 2239 Gross per 24 hour  Intake 300 ml  Output 800 ml  Net -500 ml   Filed Weights   08/02/20 0441 08/03/20 0405 08/04/20 0413  Weight: 84.6 kg 86.4 kg 85.5 kg    Examination:  General exam: Acutely psychotic. Respiratory system: Clear to auscultation.   Cardiovascular system: S1 & S2 heard. Gastrointestinal system: Abdomen is obese, soft and nontender.  Organs are difficult to assess.   Central nervous system: Psychotic.  Patient moves all extremities.  Patient is awake and alert.   Extremities: No edema   Data Reviewed: I have personally reviewed following labs and imaging studies  CBC: Recent Labs  Lab 08/08/20 0425 08/09/20 0408 08/13/20 0504  WBC 4.7 6.7 7.9  HGB 11.7* 13.6 13.4  HCT 37.0 42.7 39.3  MCV 89.6 89.1 87.3  PLT 124* 425* 202   Basic Metabolic Panel: Recent Labs  Lab 08/08/20 0425 08/09/20 0408  08/13/20 0504  NA 138 140 139  K 4.6 4.1 3.9  CL 96* 98 99  CO2 30 31 31   GLUCOSE 165* 145* 130*  BUN 15 15 18   CREATININE 0.69 0.77 0.70  CALCIUM 9.4 9.4 9.2  MG 2.0 2.1  --   PHOS 4.5 4.6  --    GFR: Estimated Creatinine Clearance: 84.5 mL/min (by C-G formula based on SCr of 0.7 mg/dL). Liver Function Tests: Recent Labs  Lab 08/08/20 0425 08/13/20 0504  AST 29 17  ALT 22 20  ALKPHOS 77 78  BILITOT 0.7 0.5  PROT 6.3* 6.3*  ALBUMIN 2.9* 2.7*   CBG: Recent Labs  Lab 08/12/20 1242 08/12/20 1649 08/12/20 1942 08/13/20 0330 08/13/20 0750  GLUCAP 125* 201* 88 139* 107*   Radiology Studies: No results found.  Scheduled Meds: .  stroke: mapping our early stages of recovery book   Does not apply Once  . (feeding supplement) PROSource Plus  30 mL Oral BID BM  . abacavir-dolutegravir-lamiVUDine  1 tablet Oral q AM  . amLODipine  10 mg Per Tube Daily  . busPIRone  15 mg Oral TID  . chlorhexidine  15 mL Mouth Rinse BID  . Chlorhexidine Gluconate Cloth  6 each Topical Daily  . cloNIDine  0.2 mg Oral BID  . dolutegravir  50 mg Oral QHS  . enoxaparin (LOVENOX) injection  40 mg Subcutaneous Daily  . insulin aspart  0-20 Units Subcutaneous Q4H  . insulin detemir  15 Units Subcutaneous BID  . lacosamide  150 mg Oral BID  . lactulose  20 g Oral TID  . levETIRAcetam  1,500 mg Oral BID  . mouth rinse  15 mL Mouth Rinse q12n4p  . multivitamin with minerals  1 tablet Per Tube Daily  . mupirocin ointment   Nasal BID  . OLANZapine  5 mg Oral BID  . pantoprazole sodium  40 mg Oral QHS  . phenobarbital  64.8 mg Oral BID  . sertraline  100 mg Oral QHS  . triamterene-hydrochlorothiazide  1 tablet Per Tube Daily  . valproic acid  1,000 mg Oral BID   Continuous Infusions:    LOS: 22 days    Time spent: 25 mins    Bonnell Public, MD. [facility]  To contact the attending provider between 7A-7P or the covering provider during after hours 7P-7A, please log into  the web site [email or web address] and access using universal [place] password for that web site. If you do not have the  password, please call the hospital operator.

## 2020-08-14 DIAGNOSIS — F308 Other manic episodes: Secondary | ICD-10-CM

## 2020-08-14 LAB — GLUCOSE, CAPILLARY
Glucose-Capillary: 80 mg/dL (ref 70–99)
Glucose-Capillary: 58 mg/dL — ABNORMAL LOW (ref 70–99)
Glucose-Capillary: 126 mg/dL — ABNORMAL HIGH (ref 70–99)
Glucose-Capillary: 185 mg/dL — ABNORMAL HIGH (ref 70–99)
Glucose-Capillary: 146 mg/dL — ABNORMAL HIGH (ref 70–99)
Glucose-Capillary: 141 mg/dL — ABNORMAL HIGH (ref 70–99)

## 2020-08-14 MED ORDER — AMLODIPINE BESYLATE 10 MG PO TABS
10.0000 mg | ORAL_TABLET | Freq: Every day | ORAL | Status: DC
  Administered 2020-08-15 – 2020-08-17 (×3): 10 mg via ORAL
  Filled 2020-08-14 (×3): qty 1

## 2020-08-14 MED ORDER — ACETAMINOPHEN 325 MG PO TABS
650.0000 mg | ORAL_TABLET | ORAL | Status: DC | PRN
  Administered 2020-08-17: 650 mg via ORAL
  Filled 2020-08-14: qty 2

## 2020-08-14 MED ORDER — ADULT MULTIVITAMIN W/MINERALS CH
1.0000 | ORAL_TABLET | Freq: Every day | ORAL | Status: DC
  Administered 2020-08-15 – 2020-08-17 (×3): 1 via ORAL
  Filled 2020-08-14 (×3): qty 1

## 2020-08-14 MED ORDER — ACETAMINOPHEN 160 MG/5ML PO SOLN: 650.0000 mg | ORAL | Status: DC | PRN

## 2020-08-14 MED ORDER — ACETAMINOPHEN 650 MG RE SUPP: 650.0000 mg | RECTAL | Status: DC | PRN

## 2020-08-14 MED ORDER — TRIAMTERENE-HCTZ 37.5-25 MG PO TABS
1.0000 | ORAL_TABLET | Freq: Every day | ORAL | Status: DC
  Administered 2020-08-15 – 2020-08-17 (×3): 1 via ORAL
  Filled 2020-08-14 (×3): qty 1

## 2020-08-14 MED ORDER — POLYETHYLENE GLYCOL 3350 17 G PO PACK: 17.0000 g | PACK | Freq: Every day | ORAL | Status: DC | PRN

## 2020-08-14 NOTE — Progress Notes (Signed)
PROGRESS NOTE    Susan [name]  VOJ:[id number] DOB: [birth year] DOA: 07/20/2020 PCP: Hoyt Koch, MD    Brief Narrative: Patient is a 59 year old African-American female with extensive past medical and psychiatric history.  Patient carries diagnosis of schizoaffective disorder / bipolar disorder, seizures, essential hypertension, diabetes mellitus type 2, HIV,  polysubstance abuse and history of stroke in August 2021 notable for prolonged hospitalization,  developed sepsis with respiratory failure during that time.  She had trach and PEG which is now removed,  comes into the hospital for acute encephalopathy on 07/29/2020 .  There was a concern for stroke. Patient was admitted for acute metabolic encephalopathy possibly multifactorial,  could be secondary to seizures / hypertension /medication induced. EEG is consistent with status epilepticus , seizures are controlled.  Psych medications resumed.  PT recommended SNF.  08/13/2020: Patient seen.  Patient would not come down to talk to me.  Patient continued to shout. 08/14/2020: Patient seen today.  Patient is not shouting, but remains significantly disinhibited.  Psychiatric team is managing patient's psychosis.  Patient is still on antidepressants.  The psychopathology seems to input manic/hypomanic episode.  Will defer decision to discontinue antidepressants to the psychiatric team.  Assessment & Plan:   Active Problems:   Essential hypertension   Schizoaffective disorder, bipolar type (HCC)   GERD (gastroesophageal reflux disease)   Uncontrolled type 2 diabetes mellitus with hyperglycemia, with long-term current use of insulin (HCC)   Human immunodeficiency virus (HIV) disease (HCC)   Metabolic encephalopathy   PRES (posterior reversible encephalopathy syndrome)   Palliative care encounter   Acute metabolic encephalopathy secondary to seizures / accelerated hypertension:  She was started on Cardene on admission, her MRI of  the brain was compatible with PRES.  She has been weaned off Cardene.  Continue Norvasc, clonidine and Maxide.  Blood pressures controlled.  Ongoing mental status changes.  Unclear how much of this is due to encephalopathy versus baseline psychiatry issues.  Palliative care consulted to discuss with family goals of care , family wants to be full code and aggressive care.  I discussed in detail with psychiatry 12/9, consulted again, adding as needed IM meds and adjusted her regimen.  EKG, QTC 450 ms.  Psychiatry indicated that since patient's psych meds had been held for some time and recently started, gradually being titrated and may take some time to take effect.  I specifically requested psychiatry to closely follow medically 3 times a week since her predominant issue is behavioral issue at this time. 08/13/2020: Blood pressure is optimized.  Psychiatric input to be highly appreciated.  Patient remains psychotic.  New onset epilepsy/chronic stroke:  Continuous EEG showed status epilepticus.  She is currently on phenobarbital, Keppra, Vimpat and Depakote.  Neurology were on board,  no further seizure events.  Continue seizure precautions.  Neurology recommended increasing Keppra if there is further seizures,  continue others at same dosing.  08/14/2020: No further seizures reported.  Dysphagia:  Patient initially failed swallowing evaluation.  Per speech therapy pathology 12/7, dysphagia 3 diet and thin liquids.  Family is very reluctant and they will probably want everything done for her including a PEG tube.  As oral intake gradually improved, NG tube was discontinued 12/10.  Continue to monitor oral intake closely.  History of schizophrenia/schizoaffective disorder/bipolar:  Psychiatry has seen multiple times this admission and medications are being managed by them.  Please see the detailed discussion above.  I have specifically requested that they continue to follow  patient closely at least 3 times a week for evaluation and medication management.  They verbalized understanding.  Since yesterday, agitation seems to have improved.  Patient cooperative with care this morning when I was in her room and nursing were changing her pure wick.  She continued to be confused-referring to me as her brother-in-law. 08/14/2020: Patient seems to be in hypomanic/manic phase.  Patient is on antipsychotics.  However, patient is still on antidepressants.  Psychiatry team is managing.  Will defer decision to discontinue antidepressants to the psychiatric team.  HIV/AIDS:  Continue HAART therapy.  Repeat CD4 count outpatient.  Infectious disease consulted for adjustment of HIV medications.  Uncontrolled diabetes mellitus type 2:  Hb A1c of 7.7.  Blood glucose fairly controlled,    continue long-acting insulin plus sliding scale. 08/13/2020: Continue to optimize blood sugar control.  Hyperkalemia:  Resolved.   DVT prophylaxis: Lovenox Code Status: Full Family Communication: None at bedside today.  I discussed in detail with patient's daughter via phone, updated care and answered all questions.  She is requesting periodic updates from the physician.  Disposition Plan:    Status is: Inpatient  Remains inpatient appropriate because:Inpatient level of care appropriate due to severity of illness   Dispo: The patient is from: Home              Anticipated d/c is to: SNF              Anticipated d/c date is: > 3 days              Patient currently is not medically stable to d/c.   Consultants:   Neurology and psychiatry  Procedures:  None Antimicrobials:  Anti-infectives (From admission, onward)   Start     Dose/Rate Route Frequency Ordered Stop   08/10/20 1000  abacavir-dolutegravir-lamiVUDine (TRIUMEQ) 600-50-300 MG per tablet 1 tablet        1 tablet Oral Every morning 08/09/20 1436     08/09/20 2200  dolutegravir (TIVICAY) tablet 50 mg        50  mg Oral Daily at bedtime 08/09/20 1436     08/08/20 2200  dolutegravir (TIVICAY) tablet 50 mg  Status:  Discontinued        50 mg Oral As directed 08/08/20 1118 08/08/20 1227   08/08/20 2200  dolutegravir (TIVICAY) tablet 50 mg  Status:  Discontinued        50 mg Oral Daily 08/08/20 1227 08/09/20 1436   08/08/20 1215  abacavir-dolutegravir-lamiVUDine (TRIUMEQ) 600-50-300 MG per tablet 1 tablet  Status:  Discontinued        1 tablet Oral Daily 08/08/20 1118 08/09/20 1436   08/02/20 1000  bictegravir-emtricitabine-tenofovir AF (BIKTARVY) 50-200-25 MG per tablet 1 tablet  Status:  Discontinued        1 tablet Oral Daily 08/01/20 1114 08/08/20 1118   07/31/20 1000  dolutegravir (TIVICAY) tablet 50 mg  Status:  Discontinued       "And" Linked Group Details   50 mg Per Tube Daily 07/30/20 1023 08/01/20 1121   07/31/20 1000  emtricitabine-tenofovir AF (DESCOVY) 200-25 MG per tablet 1 tablet  Status:  Discontinued       "And" Linked Group Details   1 tablet Per Tube Daily 07/30/20 1023 08/01/20 1121   07/21/20 1000  bictegravir-emtricitabine-tenofovir AF (BIKTARVY) 50-200-25 MG per tablet 1 tablet  Status:  Discontinued        1 tablet Oral Daily 07/21/20 0058 07/30/20 1023     Subjective: -  Patient is disinhibited.  Objective: Vitals:   08/13/20 1537 08/13/20 1937 08/14/20 0320 08/14/20 1355  BP: 98/83 102/81 118/60 110/74  Pulse: 99 95 77 87  Resp: 18 18 17 18   Temp: 98.3 F (36.8 C) 98.2 F (36.8 C) (!) 97.5 F (36.4 C) 97.7 F (36.5 C)  TempSrc: Oral Oral  Oral  SpO2: 100% 100% 100% 100%  Weight:      Height:        Intake/Output Summary (Last 24 hours) at 08/14/2020 1652 Last data filed at 08/14/2020 1400 Gross per 24 hour  Intake 540 ml  Output 550 ml  Net -10 ml   Filed Weights   08/02/20 0441 08/03/20 0405 08/04/20 0413  Weight: 84.6 kg 86.4 kg 85.5 kg    Examination:  General exam: Acutely psychotic. Respiratory system: Clear to auscultation.   Cardiovascular  system: S1 & S2 heard. Gastrointestinal system: Abdomen is obese, soft and nontender.  Organs are difficult to assess.   Central nervous system: Psychotic.  Patient moves all extremities.  Patient is awake and alert.   Extremities: No edema   Data Reviewed: I have personally reviewed following labs and imaging studies  CBC: Recent Labs  Lab 08/08/20 0425 08/09/20 0408 08/13/20 0504  WBC 4.7 6.7 7.9  HGB 11.7* 13.6 13.4  HCT 37.0 42.7 39.3  MCV 89.6 89.1 87.3  PLT 124* 425* 578   Basic Metabolic Panel: Recent Labs  Lab 08/08/20 0425 08/09/20 0408 08/13/20 0504  NA 138 140 139  K 4.6 4.1 3.9  CL 96* 98 99  CO2 30 31 31   GLUCOSE 165* 145* 130*  BUN 15 15 18   CREATININE 0.69 0.77 0.70  CALCIUM 9.4 9.4 9.2  MG 2.0 2.1  --   PHOS 4.5 4.6  --    GFR: Estimated Creatinine Clearance: 84.5 mL/min (by C-G formula based on SCr of 0.7 mg/dL). Liver Function Tests: Recent Labs  Lab 08/08/20 0425 08/13/20 0504  AST 29 17  ALT 22 20  ALKPHOS 77 78  BILITOT 0.7 0.5  PROT 6.3* 6.3*  ALBUMIN 2.9* 2.7*   CBG: Recent Labs  Lab 08/14/20 0317 08/14/20 0748 08/14/20 0859 08/14/20 1205 08/14/20 1613  GLUCAP 80 58* 126* 185* 146*   Radiology Studies: No results found.  Scheduled Meds:   stroke: mapping our early stages of recovery book   Does not apply Once   (feeding supplement) PROSource Plus  30 mL Oral BID BM   abacavir-dolutegravir-lamiVUDine  1 tablet Oral q AM   [START ON 08/15/2020] amLODipine  10 mg Oral Daily   busPIRone  15 mg Oral TID   chlorhexidine  15 mL Mouth Rinse BID   Chlorhexidine Gluconate Cloth  6 each Topical Daily   cloNIDine  0.2 mg Oral BID   dolutegravir  50 mg Oral QHS   enoxaparin (LOVENOX) injection  40 mg Subcutaneous Daily   insulin aspart  0-20 Units Subcutaneous Q4H   insulin detemir  15 Units Subcutaneous BID   lacosamide  150 mg Oral BID   lactulose  20 g Oral TID   levETIRAcetam  1,500 mg Oral BID   mouth rinse   15 mL Mouth Rinse q12n4p   multivitamin with minerals  1 tablet Oral Daily   mupirocin ointment   Nasal BID   OLANZapine  5 mg Oral BID   pantoprazole sodium  40 mg Oral QHS   phenobarbital  64.8 mg Oral BID   sertraline  100 mg Oral QHS   [  START ON 08/15/2020] triamterene-hydrochlorothiazide  1 tablet Oral Daily   valproic acid  1,000 mg Oral BID   Continuous Infusions:    LOS: 23 days    Time spent: 25 mins    Bonnell Public, MD. [facility]  To contact the attending provider between 7A-7P or the covering provider during after hours 7P-7A, please log into the web site [email or web address] and access using universal [place] password for that web site. If you do not have the password, please call the hospital operator.

## 2020-08-14 NOTE — Plan of Care (Signed)
  Problem: Self-Concept: Goal: Level of anxiety will decrease Outcome: Progressing Note: Reassured pt multiple times and this decreases her anxiety. Pt appears pleasant.

## 2020-08-14 NOTE — [place] FL2 (Addendum)
[place] MEDICAID FL2 LEVEL OF CARE SCREENING TOOL     IDENTIFICATION  Patient Name: Susan [name] Birthdate: 1961/01/28 Sex: female Admission Date (Current Location): 07/20/2020  [facility] and [place] Number:  Herbalist and Address:  [facility] [place] [place] [address], [place], [place] [address]      Provider Number: [id number]  Attending Physician Name and Address:  Bonnell Public, MD  Relative Name and Phone Number:  Clarece, Drzewiecki (Daughter) [phone] [facility])    Current Level of Care: Hospital Recommended Level of Care: Index Prior Approval Number:    Date Approved/Denied: 08/03/20 PASRR Number: [id number]  Discharge Plan: SNF    Current Diagnoses: Patient Active Problem List   Diagnosis Date Noted  . PRES (posterior reversible encephalopathy syndrome) 07/31/2020  . Palliative care encounter   . Metabolic encephalopathy 92/42/6834  . Altered mental status 07/20/2020  . Encephalopathy 04/02/2020  . Acute metabolic encephalopathy 19/62/2297  . Muscle spasm 04/01/2020  . Acute encephalopathy 03/08/2020  . Palliative care by specialist   . Adult failure to thrive   . Hx of completed stroke 11/24/2019  . Acute kidney injury (AKI) with acute tubular necrosis (ATN) (HCC)   . Aortic valve endocarditis 11/14/2019  . Recurrent falls 10/15/2019  . Constipation 02/26/2019  . Schizophrenia ([place]) 02/18/2019  . AKI (acute kidney injury) ([place]) 02/17/2019  . Chronic respiratory failure ([place]) 02/16/2019  . Right hip pain 02/21/2018  . DNR (do not resuscitate) discussion 08/23/2017  . Weakness generalized 06/24/2017  . Osteoarthritis 07/14/2016  . Abnormal hemoglobin ([place]) 04/18/2016  . Hot flashes 10/16/2015  . Human immunodeficiency virus (HIV) disease ([place]) 10/05/2015  . PTSD (post-traumatic stress disorder) 05/12/2015  . MDD (major depressive disorder), recurrent episode, moderate ([place]) 05/11/2015  . Cannabis  use disorder, severe, dependence ([place]) 05/11/2015  . Tobacco use disorder 05/11/2015  . Uncontrolled type 2 diabetes mellitus with hyperglycemia, with long-term current use of insulin ([place]) 04/18/2015  . Diabetes mellitus type 2 without retinopathy ([place]) 01/28/2015  . Hereditary and idiopathic peripheral neuropathy 06/03/2013  . Dysfunctional uterine bleeding 06/04/2012  . Dyslipidemia   . GERD (gastroesophageal reflux disease)   . Schizoaffective disorder, bipolar type ([place]) 04/26/2011  . Morbid obesity ([place]) 04/26/2011  . HIV disease ([place]) 03/28/2011  . Essential hypertension 03/28/2011    Orientation RESPIRATION BLADDER Height & Weight        Normal External catheter,Incontinent Weight: 188 lb 7.9 oz (85.5 kg) Height:  5\' 6"  (167.6 cm)  BEHAVIORAL SYMPTOMS/MOOD NEUROLOGICAL BOWEL NUTRITION STATUS     (New onset epilepsy/chronic stroke) Incontinent Diet  AMBULATORY STATUS COMMUNICATION OF NEEDS Skin   Extensive Assist Verbally Normal                       Personal Care Assistance Level of Assistance  Bathing,Feeding,Dressing Bathing Assistance: Maximum assistance Feeding assistance: Limited assistance Dressing Assistance: Maximum assistance     Functional Limitations Info  Sight,Hearing,Speech Sight Info: Adequate Hearing Info: Adequate Speech Info: Adequate    SPECIAL CARE FACTORS FREQUENCY  PT (By licensed PT),OT (By licensed OT),Speech therapy     PT Frequency: 5x/week OT Frequency: 5x/week     Speech Therapy Frequency: 5x/week      Contractures Contractures Info: Not present    Additional Factors Info  Code Status,Allergies,Isolation Precautions,Insulin Sliding Scale,Psychotropic Code Status Info: FULL Allergies Info: Lyrica Psychotropic Info: PHENObarbital 20 MG/5ML elixir 64.8 mg;  (SEROQUEL) tablet 200 mgDose: 200 mgFreq: 2  times daily ;sertraline (ZOLOFT) tablet 50 mg; haloperidol (HALDOL) tablet 1 mg Insulin Sliding Scale Info: insulin aspart  (novoLOG) injection 0-20 Units Isolation Precautions Info: MRSA     Current Medications (08/14/2020):  This is the current hospital active medication list Current Facility-Administered Medications  Medication Dose Route Frequency Provider Last Rate Last Admin  .  stroke: mapping our early stages of recovery book   Does not apply Once Swayze, Ava, DO      . (feeding supplement) PROSource Plus liquid 30 mL  30 mL Oral BID BM Hongalgi, Anand D, MD   30 mL at 08/14/20 1452  . abacavir-dolutegravir-lamiVUDine (TRIUMEQ) 600-50-300 MG per tablet 1 tablet  1 tablet Oral q AM Susa Raring, RPH   1 tablet at 08/14/20 0950  . acetaminophen (TYLENOL) 160 MG/5ML solution 650 mg  650 mg Oral Q4H PRN Dana Allan I, MD       Or  . acetaminophen (TYLENOL) tablet 650 mg  650 mg Oral Q4H PRN Bonnell Public, MD       Or  . acetaminophen (TYLENOL) suppository 650 mg  650 mg Rectal Q4H PRN Bonnell Public, MD      . Derrill Memo ON 08/15/2020] amLODipine (NORVASC) tablet 10 mg  10 mg Oral Daily Dana Allan I, MD      . asenapine (SAPHRIS) sublingual tablet 5 mg  5 mg Sublingual BID PRN Corena Pilgrim, MD   5 mg at 08/11/20 1242  . busPIRone (BUSPAR) tablet 15 mg  15 mg Oral TID Modena Jansky, MD   15 mg at 08/14/20 0953  . chlorhexidine (PERIDEX) 0.12 % solution 15 mL  15 mL Mouth Rinse BID Jacky Kindle, MD   15 mL at 08/14/20 0950  . Chlorhexidine Gluconate Cloth 2 % PADS 6 each  6 each Topical Daily Kerney Elbe, MD   6 each at 08/14/20 1209  . cloNIDine (CATAPRES) tablet 0.2 mg  0.2 mg Oral BID Modena Jansky, MD   0.2 mg at 08/14/20 0952  . dolutegravir (TIVICAY) tablet 50 mg  50 mg Oral QHS Susa Raring, RPH   50 mg at 08/13/20 2230  . enoxaparin (LOVENOX) injection 40 mg  40 mg Subcutaneous Daily Swayze, Ava, DO   40 mg at 08/14/20 0947  . insulin aspart (novoLOG) injection 0-20 Units  0-20 Units Subcutaneous Q4H Charlynne Cousins, MD   4 Units at 08/14/20 1452  .  insulin detemir (LEVEMIR) injection 15 Units  15 Units Subcutaneous BID Shawna Clamp, MD   15 Units at 08/14/20 [phone]  . labetalol (NORMODYNE) injection 10 mg  10 mg Intravenous Q6H PRN Jacky Kindle, MD   10 mg at 07/31/20 0134  . lacosamide (VIMPAT) tablet 150 mg  150 mg Oral BID Modena Jansky, MD   150 mg at 08/14/20 0952  . lactulose (CHRONULAC) 10 GM/15ML solution 20 g  20 g Oral TID Modena Jansky, MD   20 g at 08/14/20 0952  . levETIRAcetam (KEPPRA) 100 MG/ML solution 1,500 mg  1,500 mg Oral BID Modena Jansky, MD   1,500 mg at 08/14/20 0953  . LORazepam (ATIVAN) injection 1 mg  1 mg Intravenous Q2H PRN Swayze, Ava, DO   1 mg at 08/10/20 1307  . MEDLINE mouth rinse  15 mL Mouth Rinse q12n4p Jacky Kindle, MD   15 mL at 08/14/20 1453  . multivitamin with minerals tablet 1 tablet  1 tablet Oral Daily Bonnell Public, MD      .  mupirocin ointment (BACTROBAN) 2 %   Nasal BID Kerney Elbe, MD   Given at 08/14/20 1453  . OLANZapine (ZYPREXA) tablet 5 mg  5 mg Oral BID Suella Broad, FNP   5 mg at 08/14/20 0953  . ondansetron (ZOFRAN) injection 4 mg  4 mg Intravenous Q6H PRN Swayze, Ava, DO      . pantoprazole sodium (PROTONIX) 40 mg/20 mL oral suspension 40 mg  40 mg Oral QHS Modena Jansky, MD   40 mg at 08/13/20 2234  . PHENobarbital (LUMINAL) tablet 64.8 mg  64.8 mg Oral BID Dana Allan I, MD   64.8 mg at 08/14/20 0953  . polyethylene glycol (MIRALAX / GLYCOLAX) packet 17 g  17 g Oral Daily PRN Dana Allan I, MD      . sertraline (ZOLOFT) tablet 100 mg  100 mg Oral QHS Modena Jansky, MD   100 mg at 08/13/20 2227  . [START ON 08/15/2020] triamterene-hydrochlorothiazide (MAXZIDE-25) 37.5-25 MG per tablet 1 tablet  1 tablet Oral Daily Dana Allan I, MD      . valproic acid (DEPAKENE) 250 MG capsule 1,000 mg  1,000 mg Oral BID Dana Allan I, MD   1,000 mg at 08/14/20 8882     Discharge Medications: Please see discharge summary for a list of  discharge medications.  Relevant Imaging Results:  Relevant Lab Results:   Additional Information SSN [id number]  Bary Castilla, LCSW

## 2020-08-14 NOTE — TOC Progression Note (Signed)
Transition of Care [facility]) - Progression Note    Patient Details  Name: Susan [name] MRN: [id number] Date of Birth: May 15, 1961  Transition of Care [facility]) CM/SW [place], [place] Phone Number: [phone] 08/14/2020, 3:44 PM  Clinical Narrative:     CSW was informed that patient was no longer on feeding tube. CSW updated previous fl2 with that information as well as the PASRR number and re-faxed out referral along with updated therapy notes.  TOC team will continue to assist with discharge planning needs.  Expected Discharge Plan: [place] Barriers to Discharge: Continued Medical Work up  Expected Discharge Plan and Services Expected Discharge Plan: [place] In-house Referral: NA Discharge Planning Services: CM Consult Post Acute Care Choice: NA Living arrangements for the past 2 months: Single Family Home                 DME Arranged: N/A DME Agency: NA       HH Arranged: NA HH Agency: NA         Social Determinants of Health (SDOH) Interventions    Readmission Risk Interventions Readmission Risk Prevention Plan 03/11/2020 02/19/2019  Transportation Screening Complete Complete  Medication Review Press photographer) Referral to Pharmacy Complete  PCP or Specialist appointment within 3-5 days of discharge Complete Complete  HRI or Home Care Consult Complete Complete  SW Recovery Care/Counseling Consult Complete Complete  Palliative Care Screening Not Applicable Not [place] Complete Not Applicable  Some recent data might be hidden

## 2020-08-15 LAB — GLUCOSE, CAPILLARY
Glucose-Capillary: 129 mg/dL — ABNORMAL HIGH (ref 70–99)
Glucose-Capillary: 138 mg/dL — ABNORMAL HIGH (ref 70–99)
Glucose-Capillary: 165 mg/dL — ABNORMAL HIGH (ref 70–99)
Glucose-Capillary: 175 mg/dL — ABNORMAL HIGH (ref 70–99)
Glucose-Capillary: 205 mg/dL — ABNORMAL HIGH (ref 70–99)
Glucose-Capillary: 212 mg/dL — ABNORMAL HIGH (ref 70–99)
Glucose-Capillary: 71 mg/dL (ref 70–99)

## 2020-08-15 MED ORDER — INSULIN ASPART 100 UNIT/ML ~~LOC~~ SOLN
0.0000 [IU] | Freq: Three times a day (TID) | SUBCUTANEOUS | Status: DC
  Administered 2020-08-15: 14:00:00 3 [IU] via SUBCUTANEOUS
  Administered 2020-08-15: 19:00:00 2 [IU] via SUBCUTANEOUS
  Administered 2020-08-16: 18:00:00 5 [IU] via SUBCUTANEOUS
  Administered 2020-08-17: 13:00:00 3 [IU] via SUBCUTANEOUS
  Administered 2020-08-17: 08:00:00 2 [IU] via SUBCUTANEOUS
  Administered 2020-08-17: 17:00:00 3 [IU] via SUBCUTANEOUS

## 2020-08-15 MED ORDER — INSULIN DETEMIR 100 UNIT/ML ~~LOC~~ SOLN
7.0000 [IU] | Freq: Two times a day (BID) | SUBCUTANEOUS | Status: DC
  Administered 2020-08-15 – 2020-08-17 (×5): 7 [IU] via SUBCUTANEOUS
  Filled 2020-08-15 (×7): qty 0.07

## 2020-08-15 NOTE — Progress Notes (Signed)
Pt's blood sugar is 71 at 0400. RN had been giving snacks to patient all night. Will give snacks this AM. Will monitor pt.

## 2020-08-15 NOTE — Plan of Care (Signed)
Patient alert and remains pleasantly confused. Voiding, no BM medications provided for bowel movement. Assisting patient with feedings to improve nutrition. Providing music therapy for relaxation. Patient easily redirected verbally when using profanity and trying to get out of bed.   Problem: Clinical Measurements: Goal: Ability to maintain clinical measurements within normal limits will improve Outcome: Progressing Goal: Will remain free from infection Outcome: Progressing Goal: Diagnostic test results will improve Outcome: Progressing Goal: Respiratory complications will improve Outcome: Progressing   Problem: Nutrition: Goal: Adequate nutrition will be maintained Outcome: Progressing   Problem: Coping: Goal: Level of anxiety will decrease Outcome: Progressing   Problem: Elimination: Goal: Will not experience complications related to bowel motility Outcome: Progressing Goal: Will not experience complications related to urinary retention Outcome: Progressing   Problem: Pain Managment: Goal: General experience of comfort will improve Outcome: Progressing   Problem: Safety: Goal: Ability to remain free from injury will improve Outcome: Progressing   Problem: Skin Integrity: Goal: Risk for impaired skin integrity will decrease Outcome: Progressing   Problem: Education: Goal: Expressions of having a comfortable level of knowledge regarding the disease process will increase Outcome: Progressing   Problem: Coping: Goal: Ability to adjust to condition or change in health will improve Outcome: Progressing Goal: Ability to identify appropriate support needs will improve Outcome: Progressing   Problem: Health Behavior/Discharge Planning: Goal: Compliance with prescribed medication regimen will improve Outcome: Progressing   Problem: Medication: Goal: Risk for medication side effects will decrease Outcome: Progressing   Problem: Clinical Measurements: Goal:  Complications related to the disease process, condition or treatment will be avoided or minimized Outcome: Progressing Goal: Diagnostic test results will improve Outcome: Progressing   Problem: Self-Concept: Goal: Level of anxiety will decrease Outcome: Progressing   Problem: Health Behavior/Discharge Planning: Goal: Ability to manage health-related needs will improve Outcome: Progressing   Problem: Self-Care: Goal: Ability to participate in self-care as condition permits will improve Outcome: Progressing   Problem: Nutrition: Goal: Risk of aspiration will decrease Outcome: Progressing Goal: Dietary intake will improve Outcome: Progressing

## 2020-08-15 NOTE — Consult Note (Signed)
Patient has a diagnosis of schizophrenia, medications have been resumed, adjusted and titrated accordingly.  Patient was seen and assessed, observed to be resting.  She was difficult to arouse and heard snoring.  her behavioral disturbances (agitation, aggression, yelling and screaming) she is exhibiting is not a typical presentation of schizophrenia. Her underlying psychiatric condition is associated with decreased QOL in multiple neurological conditions including PRES, seizures and poor outcomes. While she has schizophrenia we must also consider that she has several neurologic conditions that have psychiatric presentations with organic causes this hospital admission. There is a strong correlation of neurologic sequelae, residual infarcts, and medical co morbidities that require ongoing treatment of other consulting services and providers. Presence of neuropsychiatric symptoms are not uncommon in persons who had PRES syndrome, therefore ongoing long term management by neurology and psychiatry follow-up in the community may enhance her overall recovery and prognosis.      Patient is currently taking valproic acid 1000 mg p.o. twice daily, olanzapine 5 mg p.o. twice daily, sertraline 100 mg p.o. daily.  Valproic acid level obtained today was 41.  No further recommendations will be made at this time, as patient's behaviors continue to improve and her psychosis is now stable.  Please see previous consult note dated 08/11/2020 for continued recommendations as documented. Given no previous implementations of these recommendations, no additional recommendations will be made at this time. Follow up with neurology for additional management of the previous documented diagnoses (PRES syndrome, chronic bilateral posterior MCA and right PCA/MCA distrubution infarcts, cerebral embolism, and status epilepticus; i.e prognosis and expected recovery period), if neurology findings are inconclusive, or no additional information  is found(patient new baseline?, PRES recovery weeks to months). Will re-consult at that time.  Patient has been psych cleared, she does not meet inpatient criteria for admission. Consider neuro rehabilitation.  Thank you for choosing [place] to provide care for this patient.   Priscille Loveless Perry-FNP-BC, PMHNP-BC

## 2020-08-15 NOTE — Progress Notes (Signed)
PROGRESS NOTE    Susan [name]  LPF:[id number] DOB: Mar 25, 1961 DOA: 07/20/2020 PCP: Hoyt Koch, MD   Chief Complaint  Patient presents with  . Code Stroke   Brief Narrative: 59 year old female with extensive medical history and psychiatric history with size affective disorder/bipolar disorder, seizure, essential hypertension, diabetes, type II HIV, polysubstance abuse history of stroke in August 2021 notable for prolonged hospitalization due to sepsis with respiratory failure during that time and had trach and PEG which has been reversed since, comes to the hospital for acute confusion on 07/29/2020.  Patient was admitted and treated for acute encephalopathy, there was concern for stroke, felt to be multifactorial in etiology due to seizures/hypertension/medication induced, underwent extensive work-up and evaluation EEG is consistent with status epilepticus, seizures have now been controlled, seen by psychiatry and medication has been adjusted.  PT OT working and has recommended skilled nursing facility.   Subjective: This morning she is alert awake able to tell me her name, screaming at times does not hold meaningful conversation, has outbursts at times but has been calm cooperative no agitation   Assessment & Plan:  Acute encephalopathy felt to be multifactorial due to seizures/accelerated hypertension/PR ES: Alone stencil work-up and evaluation MRI of the brain compatible with POTS has been on Cardizem drip and weaned off blood pressure is stabilized on oral management.  She has been alert awake oriented times self and place at times she had outburst with ongoing confusion versus with baseline psychiatric issues.  Palliative care closely involved and family wants to have full code/aggressive care.  Psychiatry was consulted again on 12/9 and advised to follow-up at least 3 times a week to address ongoing issues.  Psych medication gradually being titrated and may take some time to  effect since they were on hold.  We will ask psychiatry input reevaluation prior to discharge to the facility.  QTC.  Schizophrenia/psych affective disorder/bipolar: Seen by psychiatry multiple times, continue current regiment and will request psychiatric follow-up.  Ongoing hypomanic phase. Will follow any pulmonary restriction.  Zyprexa, Zoloft BuSpar  New onset epilepsy/in the background of chronic stroke: EEG showed discharges epilepticus, currently on phenobarbital, Keppra (Depakote and no recurrence of seizure, neurology has signed off.  Continue seizure precaution.  Neuro advised to increase Keppra if there is further seizure otherwise continue to monitor.  Dysphagia, initially failed swallow evaluation seen by speech and has now been placed on dysphagia 3 diet since, tolerating.  Overnight DC with hypoglycemia needing multiple snacks  Hypoglycemic w/ dysphagia needing multiple snacks monitor next 24 hours to make sure sugars remained stable, I have decreased the Lantus and made CBG q. ACH  Essential hypertension/PRES: BP is well controlled on multiple regimen including clonidine 0.2 mg twice daily, amlodipine,maxizide  HIV/AIDS:continue HART therapy repeat CD4 count outpatient, ID has seen the patient and meds were adjusted.  Uncontrolled type 2 diabetes w/ uncontrolled hemoglobin A1c 7.7, low, blood sugar/Hypoglycemia Lantus cut down 12/13. Continue sliding scale at [facility]  Lab 08/14/20 2011 08/15/20 0033 08/15/20 0449 08/15/20 0740 08/15/20 1232  GLUCAP 141* 175* 71 129* 165*   Hyperkalemia has resolved  Nutrition: Diet Order            DIET DYS 3 Room service appropriate? Yes with Assist; Fluid consistency: Thin  Diet effective now                 Nutrition Problem: Inadequate oral intake Etiology: lethargy/confusion,acute illness Signs/Symptoms: NPO status Interventions: Magic cup,Hormel Shake,MVI,Tube  feeding,Prostat  Body mass index is 30.42  kg/m. DVT prophylaxis: enoxaparin (LOVENOX) injection 40 mg Start: 07/21/20 1000 Code Status:   Code Status: Full Code Family Communication: plan of care discucussed with patient at bedside.  Status is: Inpatient Remains inpatient appropriate because:Unsafe d/c plan and Inpatient level of care appropriate due to severity of illness  Dispo: The patient is from: Home              Anticipated d/c is to: SNF              Anticipated d/c date is: 1 day              Patient currently is not medically stable to d/c.   Consultants:see note  Procedures:see note  Culture/Microbiology    Component Value Date/Time   SDES URINE, RANDOM 07/20/2020 2210   SPECREQUEST  07/20/2020 2210    NONE Performed at [facility] [address] [place], [place] [address]    CULT MULTIPLE SPECIES PRESENT, SUGGEST RECOLLECTION (A) 07/20/2020 2210   REPTSTATUS 07/21/2020 FINAL 07/20/2020 2210    Other culture-see note  Medications: Scheduled Meds: .  stroke: mapping our early stages of recovery book   Does not apply Once  . (feeding supplement) PROSource Plus  30 mL Oral BID BM  . abacavir-dolutegravir-lamiVUDine  1 tablet Oral q AM  . amLODipine  10 mg Oral Daily  . busPIRone  15 mg Oral TID  . chlorhexidine  15 mL Mouth Rinse BID  . Chlorhexidine Gluconate Cloth  6 each Topical Daily  . cloNIDine  0.2 mg Oral BID  . dolutegravir  50 mg Oral QHS  . enoxaparin (LOVENOX) injection  40 mg Subcutaneous Daily  . insulin aspart  0-15 Units Subcutaneous TID WC  . insulin detemir  7 Units Subcutaneous BID  . lacosamide  150 mg Oral BID  . lactulose  20 g Oral TID  . levETIRAcetam  1,500 mg Oral BID  . mouth rinse  15 mL Mouth Rinse q12n4p  . multivitamin with minerals  1 tablet Oral Daily  . mupirocin ointment   Nasal BID  . OLANZapine  5 mg Oral BID  . pantoprazole sodium  40 mg Oral QHS  . phenobarbital  64.8 mg Oral BID  . sertraline  100 mg Oral QHS  . triamterene-hydrochlorothiazide  1  tablet Oral Daily  . valproic acid  1,000 mg Oral BID   Continuous Infusions:  Antimicrobials: Anti-infectives (From admission, onward)   Start     Dose/Rate Route Frequency Ordered Stop   08/10/20 1000  abacavir-dolutegravir-lamiVUDine (TRIUMEQ) 600-50-300 MG per tablet 1 tablet        1 tablet Oral Every morning 08/09/20 1436     08/09/20 2200  dolutegravir (TIVICAY) tablet 50 mg        50 mg Oral Daily at bedtime 08/09/20 1436     08/08/20 2200  dolutegravir (TIVICAY) tablet 50 mg  Status:  Discontinued        50 mg Oral As directed 08/08/20 1118 08/08/20 1227   08/08/20 2200  dolutegravir (TIVICAY) tablet 50 mg  Status:  Discontinued        50 mg Oral Daily 08/08/20 1227 08/09/20 1436   08/08/20 1215  abacavir-dolutegravir-lamiVUDine (TRIUMEQ) 600-50-300 MG per tablet 1 tablet  Status:  Discontinued        1 tablet Oral Daily 08/08/20 1118 08/09/20 1436   08/02/20 1000  bictegravir-emtricitabine-tenofovir AF (BIKTARVY) 50-200-25 MG per tablet 1 tablet  Status:  Discontinued        1 tablet Oral Daily 08/01/20 1114 08/08/20 1118   07/31/20 1000  dolutegravir (TIVICAY) tablet 50 mg  Status:  Discontinued       "And" Linked Group Details   50 mg Per Tube Daily 07/30/20 1023 08/01/20 1121   07/31/20 1000  emtricitabine-tenofovir AF (DESCOVY) 200-25 MG per tablet 1 tablet  Status:  Discontinued       "And" Linked Group Details   1 tablet Per Tube Daily 07/30/20 1023 08/01/20 1121   07/21/20 1000  bictegravir-emtricitabine-tenofovir AF (BIKTARVY) 50-200-25 MG per tablet 1 tablet  Status:  Discontinued        1 tablet Oral Daily 07/21/20 0058 07/30/20 1023     Objective: Vitals: Today's Vitals   08/14/20 2100 08/15/20 0434 08/15/20 0730 08/15/20 1415  BP:  139/80  (!) 141/97  Pulse:  86  86  Resp:  18  18  Temp:  97.7 F (36.5 C)  98.7 F (37.1 C)  TempSrc:  Axillary  Axillary  SpO2:  100%  100%  Weight:      Height:      PainSc: 0-No pain  0-No pain     Intake/Output  Summary (Last 24 hours) at 08/15/2020 1515 Last data filed at 08/15/2020 1200 Gross per 24 hour  Intake 787 ml  Output 600 ml  Net 187 ml   Filed Weights   08/02/20 0441 08/03/20 0405 08/04/20 0413  Weight: 84.6 kg 86.4 kg 85.5 kg   Weight change:   Intake/Output from previous day: 12/12 0701 - 12/13 0700 In: 660 [P.O.:660] Out: 650 [Urine:650] Intake/Output this shift: Total I/O In: 547 [P.O.:547] Out: 300 [Urine:300]  Examination: General exam: AAO to self, place ,NAD, weak appearing. HEENT:Oral mucosa moist, Ear/Nose WNL grossly,dentition normal. Respiratory system: bilaterally clear,no wheezing or crackles,no use of accessory muscle, non tender. Cardiovascular system: S1 & S2 +, regular, No JVD. Gastrointestinal system: Abdomen soft, NT,ND, BS+. Nervous System:Alert, awake, moving extremities and grossly nonfocal Extremities: No edema, distal peripheral pulses palpable.  Skin: No rashes,no icterus. MSK: Normal muscle bulk,tone, power  Data Reviewed: I have personally reviewed following labs and imaging studies CBC: Recent Labs  Lab 08/09/20 0408 08/13/20 0504  WBC 6.7 7.9  HGB 13.6 13.4  HCT 42.7 39.3  MCV 89.1 87.3  PLT 425* 875   Basic Metabolic Panel: Recent Labs  Lab 08/09/20 0408 08/13/20 0504  NA 140 139  K 4.1 3.9  CL 98 99  CO2 31 31  GLUCOSE 145* 130*  BUN 15 18  CREATININE 0.77 0.70  CALCIUM 9.4 9.2  MG 2.1  --   PHOS 4.6  --    GFR: Estimated Creatinine Clearance: 84.5 mL/min (by C-G formula based on SCr of 0.7 mg/dL). Liver Function Tests: Recent Labs  Lab 08/13/20 0504  AST 17  ALT 20  ALKPHOS 78  BILITOT 0.5  PROT 6.3*  ALBUMIN 2.7*   No results for input(s): LIPASE, AMYLASE in the last 168 hours. No results for input(s): AMMONIA in the last 168 hours. Coagulation Profile: No results for input(s): INR, PROTIME in the last 168 hours. Cardiac Enzymes: No results for input(s): CKTOTAL, CKMB, CKMBINDEX, TROPONINI in the last  168 hours. BNP (last 3 results) No results for input(s): PROBNP in the last 8760 hours. HbA1C: No results for input(s): HGBA1C in the last 72 hours. CBG: Recent Labs  Lab 08/14/20 2011 08/15/20 0033 08/15/20 0449 08/15/20 0740 08/15/20 1232  GLUCAP 141*  175* 71 129* 165*   Lipid Profile: No results for input(s): CHOL, HDL, LDLCALC, TRIG, CHOLHDL, LDLDIRECT in the last 72 hours. Thyroid Function Tests: No results for input(s): TSH, T4TOTAL, FREET4, T3FREE, THYROIDAB in the last 72 hours. Anemia Panel: No results for input(s): VITAMINB12, FOLATE, FERRITIN, TIBC, IRON, RETICCTPCT in the last 72 hours. Sepsis Labs: No results for input(s): PROCALCITON, LATICACIDVEN in the last 168 hours.  No results found for this or any previous visit (from the past 240 hour(s)).   Radiology Studies: No results found.   LOS: 24 days   Antonieta Pert, MD [facility]  08/15/2020, 3:15 PM

## 2020-08-16 DIAGNOSIS — F209 Schizophrenia, unspecified: Secondary | ICD-10-CM

## 2020-08-16 LAB — GLUCOSE, CAPILLARY
Glucose-Capillary: 97 mg/dL (ref 70–99)
Glucose-Capillary: 96 mg/dL (ref 70–99)
Glucose-Capillary: 181 mg/dL — ABNORMAL HIGH (ref 70–99)
Glucose-Capillary: 219 mg/dL — ABNORMAL HIGH (ref 70–99)
Glucose-Capillary: 181 mg/dL — ABNORMAL HIGH (ref 70–99)

## 2020-08-16 LAB — SARS CORONAVIRUS 2 BY RT PCR (HOSPITAL ORDER, PERFORMED IN ~~LOC~~ HOSPITAL LAB): SARS Coronavirus 2: NEGATIVE

## 2020-08-16 LAB — VALPROIC ACID LEVEL: Valproic Acid Lvl: 41 ug/mL — ABNORMAL LOW (ref 50.0–100.0)

## 2020-08-16 MED ORDER — LEVETIRACETAM 500 MG PO TABS
1000.0000 mg | ORAL_TABLET | Freq: Two times a day (BID) | ORAL | Status: DC
  Administered 2020-08-16 – 2020-08-17 (×2): 1000 mg via ORAL
  Filled 2020-08-16 (×2): qty 2

## 2020-08-16 MED ORDER — LEVETIRACETAM 100 MG/ML PO SOLN: 1000.0000 mg | Freq: Two times a day (BID) | ORAL | Status: DC

## 2020-08-16 NOTE — Progress Notes (Signed)
Occupational Therapy Treatment Patient Details Name: Susan [name] MRN: [id number] DOB: 1961/05/19 Today's Date: 08/16/2020    History of present illness Pt is a 59 y.o. female admitted 07/20/20 with AMS. MRI compatible with posterior reversible encephalopathy syndrome (PRES); chronic bilateral posterior MCA and right PCA/MCA distribution infarcts. EEG showed new onset status epilepticus. PMH includes schizoaffective/bipolar disorder, seizures, HTN, DM2, HIV, polysubstance abuse; of note, recent admissions with prolonged stay 11/11/19-01/09/20 for sepsis s/p trach/PEG and removal, and 03/2020 with stroke.   OT comments  Upon arrival, pt supine in bed and verbalizing conversation with her daughter and "Konrad Dolores" (who were not present in room). Pt actively moving all four extremities and periodically making eye contact with therapists. Transferring pt to recliner with use of maximove to optimize upright posture and possible increase engagement in ADLs. Pt very talkative to her daughter and Konrad Dolores fluctuating between happy and crying. With pt in recliner, transitioning her to sink to facilitate grooming tasks in "normal" environment. Pt very distracted internally by conversation with family. Pt one moment of seeing her self in mirror and becoming fixated with reflection. Pt benefiting from soft singing when she would stop talking and listen. After singing finished pt would then be able to make eye contact, looking around her environment, or engage in self feeding task. Pt requiring physical Max A for grooming and self feeding. Continue to recommend dc to SNF and will continue to follow acutely as admitted.    Follow Up Recommendations  SNF;Supervision/Assistance - 24 hour    Equipment Recommendations  Other (comment) (defer to next venue)    Recommendations for Other Services  (Psych and Palliative)    Precautions / Restrictions Precautions Precautions: Fall;Other (comment) Precaution Comments: AMS;  hallucinations Restrictions Weight Bearing Restrictions: No       Mobility Bed Mobility Overal bed mobility: Needs Assistance Bed Mobility: Rolling Rolling: Supervision;Mod assist         General bed mobility comments: Rolling to L side with use of bed rail and supervision; modA to roll towards R-side for maximove pad placement  Transfers Overall transfer level: Needs assistance Equipment used: Ambulation equipment used             General transfer comment: Use of maximove lift from bed to recliner, pt tolerated very well    Balance Overall balance assessment: Needs assistance Sitting-balance support: Feet supported;No upper extremity supported Sitting balance-Leahy Scale: Fair Sitting balance - Comments: Able to maintain static sitting at edge of recliner with and without UE support                                   ADL either performed or assessed with clinical judgement   ADL Overall ADL's : Needs assistance/impaired Eating/Feeding: Maximal assistance;Sitting Eating/Feeding Details (indicate cue type and reason): Pt requiring Max A for self feeding due to decreased attention to task. Pt continuing conversation with "daughter and Tommie" who are not present and at moments will make eye contact with therapists. Max visal cues to look at spoon and then pt opening her mouth to accept bite of food. During feeding, pt comments to daughter that she really wants to eat lunch and the food is good. Pt with increased moments of clarity and attention to task at hand when therapist sings softly. Grooming: Wash/dry hands;Wash/dry face;Maximal assistance;Cueing for sequencing;Sitting Grooming Details (indicate cue type and reason): Rolling to to sink in bathroom while seated at recliner.  Max hand over hand for wiping her mouth. Attempting to have pt engage in brushing teeth, but unable to faciltiate attention to task. Pt with one moment of seeing her reflection in mirror  and becoming fixated (only lasting ~1 min)                             Functional mobility during ADLs: Total assistance (Total A with use of maxi move) General ADL Comments: Upon arrival, pt chatty and happy - verablizing conversation with her daughter and "tommy" and reports she is talking on the phone to them. Use of maxi move to lift pt to recliner. Once in recliner, rolling pt to sink in bathroom to attempt engagement in grooming tasks. Pt with increased engagement when therapist sings softly and positions into pt's visual field. Pt requiring Max A for grooming and self feeding.     Vision       Perception     Praxis      Cognition Arousal/Alertness: Awake/alert Behavior During Therapy: Restless Overall Cognitive Status: No family/caregiver present to determine baseline cognitive functioning Area of Impairment: Orientation;Attention;Following commands;Awareness;Problem solving;Safety/judgement;Memory                 Orientation Level: Disoriented to;Place;Time;Situation Current Attention Level: Focused Memory: Decreased short-term memory Following Commands: Follows one step commands inconsistently Safety/Judgement: Decreased awareness of safety;Decreased awareness of deficits Awareness: Intellectual Problem Solving: Slow processing;Decreased initiation;Difficulty sequencing;Requires verbal cues;Requires tactile cues General Comments: Pt fluctuating between happy and interactive with "her daughter and husband" (who were not in room), to emotional/crying while apparently "talking on phone" to someone; pt perseverating on talking to daughter (not present) and a "Thomas". Minimal command following. Waved goodbye appropriately to therapist. Aware of therapists' singing which seemed to help pt calm down and focus on the present situation, but pt quick to return to "talking" to others not present in room        Exercises     Shoulder Instructions       General  Comments Pt very senstive to auditory simuli    Pertinent Vitals/ Pain       Pain Assessment: Faces Faces Pain Scale: No hurt Pain Location: with tactile stimulation x 4 extremities Pain Descriptors / Indicators: Grimacing;Moaning Pain Intervention(s): Monitored during session;Repositioned;Limited activity within patient's tolerance  Home Living                                          Prior Functioning/Environment              Frequency  Min 1X/week        Progress Toward Goals  OT Goals(current goals can now be found in the care plan section)  Progress towards OT goals: Progressing toward goals  Acute Rehab OT Goals Patient Stated Goal: none stated OT Goal Formulation: Patient unable to participate in goal setting ADL Goals Pt Will Perform Grooming: with mod assist;bed level Pt Will Perform Upper Body Dressing: with set-up;sitting Pt Will Perform Lower Body Dressing: with min guard assist;sit to/from stand Pt Will Transfer to Toilet: with min guard assist;ambulating Pt Will Perform Toileting - Clothing Manipulation and hygiene: with mod assist;sit to/from stand Additional ADL Goal #1: Pt will follow one step commands 1/4 times for ADL completion Additional ADL Goal #2: Pt will perform bed mobility with max A to come  EOB in preparation to participate in ADL  Plan Discharge plan remains appropriate    Co-evaluation    PT/OT/SLP Co-Evaluation/Treatment: Yes Reason for Co-Treatment: Complexity of the patient's impairments (multi-system involvement);To address functional/ADL transfers;For patient/therapist safety;Necessary to address cognition/behavior during functional activity PT goals addressed during session: Mobility/safety with mobility;Balance OT goals addressed during session: ADL's and self-care      AM-PAC OT "6 Clicks" Daily Activity     Outcome Measure   Help from another person eating meals?: Total Help from another person taking  care of personal grooming?: Total Help from another person toileting, which includes using toliet, bedpan, or urinal?: Total Help from another person bathing (including washing, rinsing, drying)?: Total Help from another person to put on and taking off regular upper body clothing?: Total Help from another person to put on and taking off regular lower body clothing?: Total 6 Click Score: 6    End of Session Equipment Utilized During Treatment: Gait belt  OT Visit Diagnosis: Other abnormalities of gait and mobility (R26.89);Other symptoms and signs involving cognitive function;Low vision, both eyes (H54.2);Muscle weakness (generalized) (M62.81)   Activity Tolerance Treatment limited secondary to agitation   Patient Left with call bell/phone within reach;with chair alarm set;in chair (belt alarm)   Nurse Communication Mobility status (pt requiring assistance for eating lunch)        Time: 1027-2536 OT Time Calculation (min): 40 min  Charges: OT General Charges $OT Visit: 1 Visit OT Treatments $Self Care/Home Management : 23-37 mins  [place], OTR/L Acute Rehab Pager: [phone] Office: [place] 08/16/2020, 2:02 PM

## 2020-08-16 NOTE — Progress Notes (Signed)
Subjective:   ROS: Unable to obtain due to poor mental status  Examination  Vital signs in last 24 hours: Temp:  [98.2 F (36.8 C)-99.2 F (37.3 C)] 99.2 F (37.3 C) (12/14 0501) Pulse Rate:  [80-97] 80 (12/14 0501) Resp:  [17-19] 17 (12/14 0501) BP: (128-150)/(82-97) 150/82 (12/14 0501) SpO2:  [100 %] 100 % (12/14 0501)  General: Sitting in recliner, not in apparent distress CVS: pulse-normal rate and rhythm RS: breathing comfortably Extremities: normal  Psych: Having phone conversation with her daughter was not in the room(+ auditory hallucinations) intermittently starts crying Neuro: Awake, alert, randomly told me her name but does not follow commands, looking around in the room, no apparent facial asymmetry, moving all 4 extremities spontaneously.  Basic Metabolic Panel: Recent Labs  Lab 08/13/20 0504  NA 139  K 3.9  CL 99  CO2 31  GLUCOSE 130*  BUN 18  CREATININE 0.70  CALCIUM 9.2    CBC: Recent Labs  Lab 08/13/20 0504  WBC 7.9  HGB 13.4  HCT 39.3  MCV 87.3  PLT 319     Coagulation Studies: No results for input(s): LABPROT, INR in the last 72 hours.  Imaging No new brain imaging overnight  ASSESSMENT AND PLAN: 59 year old female with past medical history of CVA, schizophrenia, type 2 diabetes who presented with staring and speech arrest as well as possible right-sided weakness and neglect.   New onset epilepsy Chronic strokes Acute encephalopathy, resolved Schizoaffective disorder -At this point, patient appears to be having auditory hallucinations but appears to be redirectable although with significant efforts and only briefly.  Her exam is not suspicious of primary neurologic disorder at this point.  Recommendation - As patient has been seizure-free and Keppra can worsen psychotic disorders, will reduce dose to 1000 mg twice daily - Continue VPA 1000 mg twice daily, Vimpat 150 mg twice daily,phenobarb 65 mg twice daily -Can  increasevalproic acidif any further seizures - Continue seizure precautions - PRNIV Ativan 2 mg for clinical seizure lasting more than 2 minutes - Recommend follow-up with outpatient neurology in 8 to 10 weeks .  If patient remains seizure-free, and epileptic dose can be further titrated down starting with Keppra - Management of rest of comorbidities per primary team  I have spent a total of31minuteswith the patient reviewing hospitalnotes, test results, labs and examining the patient as well as establishing an assessment and plan.>50% of time was spent in direct patient care.  Zeb Comfort Epilepsy [facility] For questions after 5pm please refer to AMION to reach the Neurologist on call

## 2020-08-16 NOTE — TOC Progression Note (Signed)
Transition of Care [facility]) - Progression Note    Patient Details  Name: KARN DERK MRN: [id number] Date of Birth: 26-Jan-1961  Transition of Care [facility] CM/SW [place], [place] Phone Number: 08/16/2020, 12:54 PM  Clinical Narrative:     PTs daughter picked [facility], CSW confirmed they can take pt at discharge. CSW started insurance auth, reference number [id number]. CSW requested covid test fro MD.   Expected Discharge Plan: [place] Barriers to Discharge: Continued Medical Work up  Expected Discharge Plan and Services Expected Discharge Plan: [place] In-house Referral: NA Discharge Planning Services: CM Consult Post Acute Care Choice: NA Living arrangements for the past 2 months: Single Family Home                 DME Arranged: N/A DME Agency: NA       HH Arranged: NA HH Agency: NA         Social Determinants of Health (SDOH) Interventions    Readmission Risk Interventions Readmission Risk Prevention Plan 03/11/2020 02/19/2019  Transportation Screening Complete Complete  Medication Review [employer]) Referral to Pharmacy Complete  PCP or Specialist appointment within 3-5 days of discharge Complete Complete  HRI or [place] Complete Complete  SW Recovery Care/Counseling Consult Complete Complete  Palliative Care Screening Not Applicable Not [place] Complete Not Applicable  Some recent data might be hidden   Emeterio Reeve, Latanya Presser, [place] Social Worker [phone]

## 2020-08-16 NOTE — Progress Notes (Signed)
Physical Therapy Treatment Patient Details Name: Susan [name] MRN: [id number] DOB: 07/23/1961 Today's Date: 08/16/2020    History of Present Illness Pt is a 59 y.o. female admitted 07/20/20 with AMS. MRI compatible with posterior reversible encephalopathy syndrome (PRES); chronic bilateral posterior MCA and right PCA/MCA distribution infarcts. EEG showed new onset status epilepticus. PMH includes schizoaffective/bipolar disorder, seizures, HTN, DM2, HIV, polysubstance abuse; of note, recent admissions with prolonged stay 11/11/19-01/09/20 for sepsis s/p trach/PEG and removal, and 03/2020 with stroke.   PT Comments    Pt progressing with mobility this session. Use of maximove for transfer from bed to recliner, which pt tolerated well. Pt still with difficulty attending to therapists, requiring max, multimodal cues for tasks. Pt at times interacting with PT/OT, but spent majority of session apparently "talking on the phone" to daughter and another person not present (pt not actually on phone). Pt demonstrates improved RUE/RLE movement, as well as improving core control; remains limited by generalized weakness and decreased activity tolerance. Continue to recommend SNF-level therapies to maximize functional mobility and independence.   Follow Up Recommendations  SNF;Supervision/Assistance - 24 hour     Equipment Recommendations  Wheelchair (measurements PT);Wheelchair cushion (measurements PT);Hospital bed;Other (comment) (hoyer lift)    Recommendations for Other Services       Precautions / Restrictions Precautions Precaution Comments: AMS; R-side weakness(?); hallucinations Restrictions Weight Bearing Restrictions: No    Mobility  Bed Mobility Overal bed mobility: Needs Assistance Bed Mobility: Rolling Rolling: Supervision;Mod assist         General bed mobility comments: Rolling to L side with use of bed rail and supervision; modA to roll towards R-side for maximove pad  placement  Transfers Overall transfer level: Needs assistance Equipment used: Ambulation equipment used             General transfer comment: Use of maximove lift from bed to recliner, pt tolerated very well  Ambulation/Gait                 Stairs             Wheelchair Mobility    Modified Rankin (Stroke Patients Only)       Balance Overall balance assessment: Needs assistance Sitting-balance support: Feet supported;No upper extremity supported Sitting balance-Leahy Scale: Fair Sitting balance - Comments: Able to maintain static sitting at edge of recliner with and without UE support                                    Cognition Arousal/Alertness: Awake/alert Behavior During Therapy: Restless Overall Cognitive Status: No family/caregiver present to determine baseline cognitive functioning Area of Impairment: Orientation;Attention;Following commands;Awareness;Problem solving;Safety/judgement;Memory                 Orientation Level: Disoriented to;Place;Time;Situation Current Attention Level: Focused Memory: Decreased short-term memory Following Commands: Follows one step commands inconsistently Safety/Judgement: Decreased awareness of safety;Decreased awareness of deficits Awareness: Intellectual Problem Solving: Slow processing;Decreased initiation;Difficulty sequencing;Requires verbal cues;Requires tactile cues General Comments: Pt fluctuating between happy and interactive with therapists, to emotional/crying while apparently "talking on phone" to someone; pt perseverating on talking to daughter (not present) and a "Thomas". Minimal command following. Waved goodbye appropriately to therapist. Aware of therapists' singing which seemed to help pt calm down and focus on the present situation, but pt quick to return to "talking" to others not present in room      Exercises  General Comments General comments (skin integrity,  edema, etc.): Required assist to take bites of food      Pertinent Vitals/Pain Pain Assessment: Faces Faces Pain Scale: No hurt Pain Intervention(s): Monitored during session    Home Living                      Prior Function            PT Goals (current goals can now be found in the care plan section) Progress towards PT goals: Progressing toward goals    Frequency    Min 2X/week      PT Plan Current plan remains appropriate    Co-evaluation PT/OT/SLP Co-Evaluation/Treatment: Yes Reason for Co-Treatment: Complexity of the patient's impairments (multi-system involvement);Necessary to address cognition/behavior during functional activity;For patient/therapist safety;To address functional/ADL transfers PT goals addressed during session: Mobility/safety with mobility;Balance        AM-PAC PT "6 Clicks" Mobility   Outcome Measure  Help needed turning from your back to your side while in a flat bed without using bedrails?: A Little Help needed moving from lying on your back to sitting on the side of a flat bed without using bedrails?: A Lot Help needed moving to and from a bed to a chair (including a wheelchair)?: Total Help needed standing up from a chair using your arms (e.g., wheelchair or bedside chair)?: Total Help needed to walk in hospital room?: Total Help needed climbing 3-5 steps with a railing? : Total 6 Click Score: 9    End of Session   Activity Tolerance: Patient tolerated treatment well;Other (comment) (cognitive/psychological impairment) Patient left: in chair;with call bell/phone within reach;with chair alarm set Nurse Communication: Mobility status;Need for lift equipment PT Visit Diagnosis: Muscle weakness (generalized) (M62.81);Other abnormalities of gait and mobility (R26.89)     Time: 0630-1601 PT Time Calculation (min) (ACUTE ONLY): 36 min  Charges:  $Therapeutic Activity: 8-22 mins                    Mabeline Caras, PT, DPT Acute  Rehabilitation Services  Pager [phone] Office [place] 08/16/2020, 11:58 AM

## 2020-08-16 NOTE — Progress Notes (Signed)
PROGRESS NOTE    Susan DOMBEK  MAU:[id number] DOB: October 14, 1960 DOA: 07/20/2020 PCP: Hoyt Koch, MD   Chief Complaint  Patient presents with  . Code Stroke   Brief Narrative: 59 year old female with extensive medical history and psychiatric history with size affective disorder/bipolar disorder, seizure, essential hypertension, diabetes, type II HIV, polysubstance abuse history of stroke in August 2021 notable for prolonged hospitalization due to sepsis with respiratory failure during that time and had trach and PEG which has been reversed since, comes to the hospital for acute confusion on 07/29/2020.  Patient was admitted and treated for acute encephalopathy, there was concern for stroke, felt to be multifactorial in etiology due to seizures/hypertension/medication induced, underwent extensive work-up and evaluation EEG is consistent with status epilepticus, seizures have now been controlled, seen by psychiatry and medication has been adjusted.  PT OT working and has recommended skilled nursing facility.   Subjective: Seen this morning patient able to tell me her name but unable to hold meaningful conversation.  Addresses me by Dr. Olen Cordial does not answer any other questions.  Stated "thank you Dr. For taking care of me " She has been talking with people not in the room when I walked in.   Assessment & Plan:  Acute encephalopathy felt to be multifactorial due to seizures/accelerated hypertension/PR ES and also with baseline schizophrenia: Question hallucinating has been conversing with the people not physically present in the room.  Discussion neurologist Dr Hortense Ramal- who is  going to review the medication and probably cut down on Keppra to help with her mental status.  Patient had extensive work-up including MRI of the brain that was compatible with PRES. Palliative care closely involved and family wants to have full code/aggressive care.  Psychiatry was consulted again on 12/9 and I  discussed the psychiatry team 12/13, no need for inpatient psychiatric admission.  Continue current medication.   Schizophrenia/psych affective disorder/bipolar: Seen by psychiatry multiple times, continue current regiment and no need for inpatient psychiatric hospitalization.  Continue on current Zyprexa, Zoloft BuSpar  New onset epilepsy/in the background of chronic stroke: EEG showed discharges C/W status epilepticus, currently on phenobarbital, Keppra, Depakote.  Discussed with Dr. Hortense Ramal from neurology who will adjust patient medication. Continue seizure precaution.  Neuro advised to increase Keppra if there is further seizure otherwise continue to monitor.  Dysphagia, initially failed swallow evaluation seen by speech and has now been placed on dysphagia 3 diet since, tolerating.  Episode of hypoglycemia but has not recurred.  Hypoglycemic w/ dysphagia needing multiple snacks.  No more hypoglycemia after adjusting her insulin.  Continue current Lantus and monitor CBG ACHS.  Essential hypertension/PRES: BP is well controlled on multiple regimen including clonidine 0.2 mg twice daily, amlodipine,maxizide  HIV/AIDS:continue HART therapy repeat CD4 count outpatient, ID has seen the patient and meds were adjusted this admission.  Uncontrolled type 2 diabetes w/ uncontrolled hemoglobin A1c 7.7, no more hypoglycemia after adjusting Lantuson  12/13. Continue sliding scale at [facility]  Lab 08/15/20 1952 08/15/20 2335 08/16/20 0459 08/16/20 0750 08/16/20 1159  GLUCAP 205* 212* 97 96 181*   Hyperkalemia has resolved  Nutrition: Diet Order            DIET DYS 3 Room service appropriate? Yes with Assist; Fluid consistency: Thin  Diet effective now                 Nutrition Problem: Inadequate oral intake Etiology: lethargy/confusion,acute illness Signs/Symptoms: NPO status Interventions: Magic cup,Hormel Shake,MVI,Tube feeding,Prostat  Body mass index is 30.42 kg/m. DVT  prophylaxis: enoxaparin (LOVENOX) injection 40 mg Start: 07/21/20 1000 Code Status:   Code Status: Full Code Family Communication: plan of care discucussed with patient at bedside.  Discussed with psychiatrist team discussion neurologist.  Status is: Inpatient Remains inpatient appropriate because:Unsafe d/c plan and Inpatient level of care appropriate due to severity of illness  Dispo: The patient is from: Home              Anticipated d/c is to: SNF              Anticipated d/c date is: ONCE BED AVAILABLE , anticipating tomorrow. Will order covid              Patient currently is not medically stable to d/c.   Consultants:see note  Procedures:see note  Culture/Microbiology    Component Value Date/Time   SDES URINE, RANDOM 07/20/2020 2210   SPECREQUEST  07/20/2020 2210    NONE Performed at [facility] [address] [place], [place] [address]    CULT MULTIPLE SPECIES PRESENT, SUGGEST RECOLLECTION (A) 07/20/2020 2210   REPTSTATUS 07/21/2020 FINAL 07/20/2020 2210    Other culture-see note  Medications: Scheduled Meds: .  stroke: mapping our early stages of recovery book   Does not apply Once  . (feeding supplement) PROSource Plus  30 mL Oral BID BM  . abacavir-dolutegravir-lamiVUDine  1 tablet Oral q AM  . amLODipine  10 mg Oral Daily  . busPIRone  15 mg Oral TID  . chlorhexidine  15 mL Mouth Rinse BID  . Chlorhexidine Gluconate Cloth  6 each Topical Daily  . cloNIDine  0.2 mg Oral BID  . dolutegravir  50 mg Oral QHS  . enoxaparin (LOVENOX) injection  40 mg Subcutaneous Daily  . insulin aspart  0-15 Units Subcutaneous TID WC  . insulin detemir  7 Units Subcutaneous BID  . lacosamide  150 mg Oral BID  . lactulose  20 g Oral TID  . levETIRAcetam  1,000 mg Oral BID  . mouth rinse  15 mL Mouth Rinse q12n4p  . multivitamin with minerals  1 tablet Oral Daily  . mupirocin ointment   Nasal BID  . OLANZapine  5 mg Oral BID  . pantoprazole sodium  40 mg Oral QHS  .  phenobarbital  64.8 mg Oral BID  . sertraline  100 mg Oral QHS  . triamterene-hydrochlorothiazide  1 tablet Oral Daily  . valproic acid  1,000 mg Oral BID   Continuous Infusions:  Antimicrobials: Anti-infectives (From admission, onward)   Start     Dose/Rate Route Frequency Ordered Stop   08/10/20 1000  abacavir-dolutegravir-lamiVUDine (TRIUMEQ) 600-50-300 MG per tablet 1 tablet        1 tablet Oral Every morning 08/09/20 1436     08/09/20 2200  dolutegravir (TIVICAY) tablet 50 mg        50 mg Oral Daily at bedtime 08/09/20 1436     08/08/20 2200  dolutegravir (TIVICAY) tablet 50 mg  Status:  Discontinued        50 mg Oral As directed 08/08/20 1118 08/08/20 1227   08/08/20 2200  dolutegravir (TIVICAY) tablet 50 mg  Status:  Discontinued        50 mg Oral Daily 08/08/20 1227 08/09/20 1436   08/08/20 1215  abacavir-dolutegravir-lamiVUDine (TRIUMEQ) 427-06-237 MG per tablet 1 tablet  Status:  Discontinued        1 tablet Oral Daily 08/08/20 1118 08/09/20 1436  08/02/20 1000  bictegravir-emtricitabine-tenofovir AF (BIKTARVY) 50-200-25 MG per tablet 1 tablet  Status:  Discontinued        1 tablet Oral Daily 08/01/20 1114 08/08/20 1118   07/31/20 1000  dolutegravir (TIVICAY) tablet 50 mg  Status:  Discontinued       "And" Linked Group Details   50 mg Per Tube Daily 07/30/20 1023 08/01/20 1121   07/31/20 1000  emtricitabine-tenofovir AF (DESCOVY) 200-25 MG per tablet 1 tablet  Status:  Discontinued       "And" Linked Group Details   1 tablet Per Tube Daily 07/30/20 1023 08/01/20 1121   07/21/20 1000  bictegravir-emtricitabine-tenofovir AF (BIKTARVY) 50-200-25 MG per tablet 1 tablet  Status:  Discontinued        1 tablet Oral Daily 07/21/20 0058 07/30/20 1023     Objective: Vitals: Today's Vitals   08/15/20 1415 08/15/20 1953 08/15/20 2220 08/16/20 0501  BP: (!) 141/97 128/85  (!) 150/82  Pulse: 86 97  80  Resp: 18 19  17   Temp: 98.7 F (37.1 C) 98.2 F (36.8 C)  99.2 F (37.3 C)   TempSrc: Axillary Oral  Oral  SpO2: 100% 100%  100%  Weight:      Height:      PainSc:   0-No pain     Intake/Output Summary (Last 24 hours) at 08/16/2020 1206 Last data filed at 08/16/2020 0730 Gross per 24 hour  Intake 720 ml  Output 1100 ml  Net -380 ml   Filed Weights   08/02/20 0441 08/03/20 0405 08/04/20 0413  Weight: 84.6 kg 86.4 kg 85.5 kg   Weight change:   Intake/Output from previous day: 12/13 0701 - 12/14 0700 In: 1147 [P.O.:1147] Out: 1100 [Urine:1100] Intake/Output this shift: Total I/O In: 120 [P.O.:120] Out: 300 [Urine:300]  Examination: General exam: AAO TO SELF.?PALCE/?PEOPLE , NAD, weak appearing. HEENT:Oral mucosa moist, Ear/Nose WNL grossly, dentition normal. Respiratory system: bilaterally clear,no wheezing or crackles,no use of accessory muscle Cardiovascular system: S1 & S2 +, No JVD,. Gastrointestinal system: Abdomen soft, NT,ND, BS+ Nervous System:Alert, awake, moving extremities and grossly nonfocal Extremities: No edema, distal peripheral pulses palpable.  Skin: No rashes,no icterus. MSK: Normal muscle bulk,tone, power  Data Reviewed: I have personally reviewed following labs and imaging studies CBC: Recent Labs  Lab 08/13/20 0504  WBC 7.9  HGB 13.4  HCT 39.3  MCV 87.3  PLT 308   Basic Metabolic Panel: Recent Labs  Lab 08/13/20 0504  NA 139  K 3.9  CL 99  CO2 31  GLUCOSE 130*  BUN 18  CREATININE 0.70  CALCIUM 9.2   GFR: Estimated Creatinine Clearance: 84.5 mL/min (by C-G formula based on SCr of 0.7 mg/dL). Liver Function Tests: Recent Labs  Lab 08/13/20 0504  AST 17  ALT 20  ALKPHOS 78  BILITOT 0.5  PROT 6.3*  ALBUMIN 2.7*   No results for input(s): LIPASE, AMYLASE in the last 168 hours. No results for input(s): AMMONIA in the last 168 hours. Coagulation Profile: No results for input(s): INR, PROTIME in the last 168 hours. Cardiac Enzymes: No results for input(s): CKTOTAL, CKMB, CKMBINDEX, TROPONINI in the  last 168 hours. BNP (last 3 results) No results for input(s): PROBNP in the last 8760 hours. HbA1C: No results for input(s): HGBA1C in the last 72 hours. CBG: Recent Labs  Lab 08/15/20 1952 08/15/20 2335 08/16/20 0459 08/16/20 0750 08/16/20 1159  GLUCAP 205* 212* 97 96 181*   Lipid Profile: No results for input(s): CHOL, HDL, LDLCALC,  TRIG, CHOLHDL, LDLDIRECT in the last 72 hours. Thyroid Function Tests: No results for input(s): TSH, T4TOTAL, FREET4, T3FREE, THYROIDAB in the last 72 hours. Anemia Panel: No results for input(s): VITAMINB12, FOLATE, FERRITIN, TIBC, IRON, RETICCTPCT in the last 72 hours. Sepsis Labs: No results for input(s): PROCALCITON, LATICACIDVEN in the last 168 hours.  No results found for this or any previous visit (from the past 240 hour(s)).   Radiology Studies: No results found.   LOS: 25 days   Antonieta Pert, MD [facility]  08/16/2020, 12:06 PM

## 2020-08-16 NOTE — Plan of Care (Signed)
Patient up to the chair by using the hoyer lift. Patient had large stool. Patient remains confused and talking to family not present in the room.  Problem: Education: Goal: Knowledge of General Education information will improve Description: Including pain rating scale, medication(s)/side effects and non-pharmacologic comfort measures Outcome: Progressing   Problem: Health Behavior/Discharge Planning: Goal: Ability to manage health-related needs will improve Outcome: Progressing   Problem: Clinical Measurements: Goal: Ability to maintain clinical measurements within normal limits will improve Outcome: Progressing Goal: Will remain free from infection Outcome: Progressing Goal: Diagnostic test results will improve Outcome: Progressing Goal: Respiratory complications will improve Outcome: Progressing Goal: Cardiovascular complication will be avoided Outcome: Progressing   Problem: Activity: Goal: Risk for activity intolerance will decrease Outcome: Progressing   Problem: Nutrition: Goal: Adequate nutrition will be maintained Outcome: Progressing   Problem: Coping: Goal: Level of anxiety will decrease Outcome: Progressing   Problem: Elimination: Goal: Will not experience complications related to bowel motility Outcome: Progressing Goal: Will not experience complications related to urinary retention Outcome: Progressing   Problem: Pain Managment: Goal: General experience of comfort will improve Outcome: Progressing   Problem: Safety: Goal: Ability to remain free from injury will improve Outcome: Progressing   Problem: Skin Integrity: Goal: Risk for impaired skin integrity will decrease Outcome: Progressing   Problem: Education: Goal: Expressions of having a comfortable level of knowledge regarding the disease process will increase Outcome: Progressing   Problem: Coping: Goal: Ability to adjust to condition or change in health will improve Outcome:  Progressing Goal: Ability to identify appropriate support needs will improve Outcome: Progressing   Problem: Health Behavior/Discharge Planning: Goal: Compliance with prescribed medication regimen will improve Outcome: Progressing   Problem: Medication: Goal: Risk for medication side effects will decrease Outcome: Progressing   Problem: Clinical Measurements: Goal: Complications related to the disease process, condition or treatment will be avoided or minimized Outcome: Progressing Goal: Diagnostic test results will improve Outcome: Progressing   Problem: Safety: Goal: Verbalization of understanding the information provided will improve Outcome: Progressing   Problem: Self-Concept: Goal: Level of anxiety will decrease Outcome: Progressing Goal: Ability to verbalize feelings about condition will improve Outcome: Progressing   Problem: Education: Goal: Knowledge of disease or condition will improve Outcome: Progressing Goal: Knowledge of secondary prevention will improve Outcome: Progressing Goal: Knowledge of patient specific risk factors addressed and post discharge goals established will improve Outcome: Progressing Goal: Individualized Educational Video(s) Outcome: Progressing   Problem: Coping: Goal: Will verbalize positive feelings about self Outcome: Progressing Goal: Will identify appropriate support needs Outcome: Progressing   Problem: Health Behavior/Discharge Planning: Goal: Ability to manage health-related needs will improve Outcome: Progressing   Problem: Self-Care: Goal: Ability to participate in self-care as condition permits will improve Outcome: Progressing Goal: Verbalization of feelings and concerns over difficulty with self-care will improve Outcome: Progressing Goal: Ability to communicate needs accurately will improve Outcome: Progressing   Problem: Nutrition: Goal: Risk of aspiration will decrease Outcome: Progressing Goal: Dietary  intake will improve Outcome: Progressing

## 2020-08-16 NOTE — Progress Notes (Signed)
Nutrition Follow-up  DOCUMENTATION CODES:   Not applicable  INTERVENTION:   -Continue Magic cup TID with meals, each supplement provides 290 kcal and 9 grams of protein -Continue Hormel Shake TID with meals, each supplements provides 520 kcals and 22 grams protein -Continue feeding assistance with meals -Continue 30 ml Prosource Plus BID, each supplement provides 100 kcals and 15 grams protein  NUTRITION DIAGNOSIS:   Inadequate oral intake related to lethargy/confusion,acute illness as evidenced by NPO status.  Progressing; advanced to dysphagia 1 diet with thin liquids 08/02/20  GOAL:   Patient will meet greater than or equal to 90% of their needs  Progressing   MONITOR:   PO intake,Supplement acceptance,Diet advancement,Labs,Weight trends,Skin,I & O's  REASON FOR ASSESSMENT:   Consult Enteral/tube feeding initiation and management  ASSESSMENT:   59 yo female admitted with acute encephalopathy secondary to PRES and seizures, pt has not been taking psych meds at home. PMH includes DM, HIV, CVA, schizoaffective disorder, bipolar disorder, hx of trach/PEG which has been removed  11/19 Admitted 11/20 Seizure 11/26 MRI concerning for PRES, NG tube placed, TF protocol ordered 11/30- advanced to dysphagia 1 diet with thin liquids 12/2- TF re-started 12/3- s/p BSE- advanced to dysphagia 3 diet with thin liquids 12/10- TF and NGT d/c  Reviewed I/O's: +47 ml x 24 hours and -3.7 L since 08/02/20  UOP: 1.1 L x 24 hours  Pt working with therapy at time of visit.   Pt with variable intake; noted meal intake 25-100%. She is taking Prosource supplements.   Medications reviewed and include lactulose and keppra.  Per MD notes, plan for SNF placement once medically stable.   Labs reviewed: CBGS: 96-181 (inpatient orders for glycemic control are 0-15 units insulin aspart TID with meals and 7 units insulin detemir BID).   Diet Order:   Diet Order            DIET DYS 3 Room  service appropriate? Yes with Assist; Fluid consistency: Thin  Diet effective now                 EDUCATION NEEDS:   Not appropriate for education at this time  Skin:  Skin Assessment: Reviewed RN Assessment  Last BM:  08/16/20  Height:   Ht Readings from Last 1 Encounters:  07/21/20 5\' 6"  (1.676 m)    Weight:   Wt Readings from Last 1 Encounters:  08/04/20 85.5 kg    Ideal Body Weight:  59.1 kg  BMI:  Body mass index is 30.42 kg/m.  Estimated Nutritional Needs:   Kcal:  1850-2050  Protein:  105-120 grams  Fluid:  > 1.8 L    Loistine Chance, RD, LDN, [place] Registered Dietitian II Certified Diabetes Care and Education Specialist Please refer to [facility] for RD and/or RD on-call/weekend/after hours pager

## 2020-08-17 DIAGNOSIS — U071 COVID-19: Secondary | ICD-10-CM | POA: Diagnosis not present

## 2020-08-17 DIAGNOSIS — W06XXXA Fall from bed, initial encounter: Secondary | ICD-10-CM | POA: Diagnosis not present

## 2020-08-17 DIAGNOSIS — M1990 Unspecified osteoarthritis, unspecified site: Secondary | ICD-10-CM | POA: Diagnosis not present

## 2020-08-17 DIAGNOSIS — G9340 Encephalopathy, unspecified: Secondary | ICD-10-CM | POA: Diagnosis not present

## 2020-08-17 DIAGNOSIS — Z20822 Contact with and (suspected) exposure to covid-19: Secondary | ICD-10-CM | POA: Diagnosis not present

## 2020-08-17 DIAGNOSIS — Z743 Need for continuous supervision: Secondary | ICD-10-CM | POA: Diagnosis not present

## 2020-08-17 DIAGNOSIS — Z043 Encounter for examination and observation following other accident: Secondary | ICD-10-CM | POA: Diagnosis not present

## 2020-08-17 DIAGNOSIS — E785 Hyperlipidemia, unspecified: Secondary | ICD-10-CM | POA: Diagnosis not present

## 2020-08-17 DIAGNOSIS — W19XXXA Unspecified fall, initial encounter: Secondary | ICD-10-CM | POA: Diagnosis not present

## 2020-08-17 DIAGNOSIS — M2550 Pain in unspecified joint: Secondary | ICD-10-CM | POA: Diagnosis not present

## 2020-08-17 DIAGNOSIS — K219 Gastro-esophageal reflux disease without esophagitis: Secondary | ICD-10-CM | POA: Diagnosis not present

## 2020-08-17 DIAGNOSIS — Y92122 Bedroom in nursing home as the place of occurrence of the external cause: Secondary | ICD-10-CM | POA: Diagnosis not present

## 2020-08-17 DIAGNOSIS — I10 Essential (primary) hypertension: Secondary | ICD-10-CM | POA: Diagnosis not present

## 2020-08-17 DIAGNOSIS — I6783 Posterior reversible encephalopathy syndrome: Secondary | ICD-10-CM | POA: Diagnosis not present

## 2020-08-17 DIAGNOSIS — J45909 Unspecified asthma, uncomplicated: Secondary | ICD-10-CM | POA: Diagnosis not present

## 2020-08-17 DIAGNOSIS — Z794 Long term (current) use of insulin: Secondary | ICD-10-CM | POA: Diagnosis not present

## 2020-08-17 DIAGNOSIS — Z7401 Bed confinement status: Secondary | ICD-10-CM | POA: Diagnosis not present

## 2020-08-17 DIAGNOSIS — Z79899 Other long term (current) drug therapy: Secondary | ICD-10-CM | POA: Diagnosis not present

## 2020-08-17 DIAGNOSIS — S0990XA Unspecified injury of head, initial encounter: Secondary | ICD-10-CM | POA: Diagnosis not present

## 2020-08-17 DIAGNOSIS — E1165 Type 2 diabetes mellitus with hyperglycemia: Secondary | ICD-10-CM | POA: Diagnosis not present

## 2020-08-17 DIAGNOSIS — R404 Transient alteration of awareness: Secondary | ICD-10-CM | POA: Diagnosis not present

## 2020-08-17 DIAGNOSIS — R569 Unspecified convulsions: Secondary | ICD-10-CM | POA: Diagnosis not present

## 2020-08-17 DIAGNOSIS — F17210 Nicotine dependence, cigarettes, uncomplicated: Secondary | ICD-10-CM | POA: Diagnosis not present

## 2020-08-17 DIAGNOSIS — R410 Disorientation, unspecified: Secondary | ICD-10-CM | POA: Diagnosis not present

## 2020-08-17 DIAGNOSIS — E1140 Type 2 diabetes mellitus with diabetic neuropathy, unspecified: Secondary | ICD-10-CM | POA: Diagnosis not present

## 2020-08-17 DIAGNOSIS — R1310 Dysphagia, unspecified: Secondary | ICD-10-CM | POA: Diagnosis not present

## 2020-08-17 LAB — GLUCOSE, CAPILLARY
Glucose-Capillary: 127 mg/dL — ABNORMAL HIGH (ref 70–99)
Glucose-Capillary: 142 mg/dL — ABNORMAL HIGH (ref 70–99)
Glucose-Capillary: 172 mg/dL — ABNORMAL HIGH (ref 70–99)
Glucose-Capillary: 192 mg/dL — ABNORMAL HIGH (ref 70–99)

## 2020-08-17 MED ORDER — ASENAPINE MALEATE 2.5 MG SL SUBL: 5.0000 mg | SUBLINGUAL_TABLET | Freq: Two times a day (BID) | SUBLINGUAL | 30 refills | Status: DC | PRN

## 2020-08-17 MED ORDER — INSULIN DETEMIR 100 UNIT/ML ~~LOC~~ SOLN: 7.0000 [IU] | Freq: Two times a day (BID) | SUBCUTANEOUS | 11 refills | Status: DC

## 2020-08-17 MED ORDER — ABACAVIR-DOLUTEGRAVIR-LAMIVUD 600-50-300 MG PO TABS: 1.0000 | ORAL_TABLET | Freq: Every morning | ORAL | 0 refills | Status: DC

## 2020-08-17 MED ORDER — PROSOURCE PLUS PO LIQD: 30.0000 mL | Freq: Two times a day (BID) | ORAL | Status: DC

## 2020-08-17 MED ORDER — VALPROIC ACID 250 MG PO CAPS: 1000.0000 mg | ORAL_CAPSULE | Freq: Two times a day (BID) | ORAL | Status: DC

## 2020-08-17 MED ORDER — PHENOBARBITAL 64.8 MG PO TABS: 64.8000 mg | ORAL_TABLET | Freq: Two times a day (BID) | ORAL | 0 refills | Status: DC

## 2020-08-17 MED ORDER — LEVETIRACETAM 1000 MG PO TABS: 1000.0000 mg | ORAL_TABLET | Freq: Two times a day (BID) | ORAL | Status: DC

## 2020-08-17 MED ORDER — SERTRALINE HCL 100 MG PO TABS: 100.0000 mg | ORAL_TABLET | Freq: Every day | ORAL | Status: DC

## 2020-08-17 MED ORDER — OLANZAPINE 5 MG PO TABS: 5.0000 mg | ORAL_TABLET | Freq: Two times a day (BID) | ORAL | Status: DC

## 2020-08-17 MED ORDER — LACOSAMIDE 150 MG PO TABS: 150.0000 mg | ORAL_TABLET | Freq: Two times a day (BID) | ORAL | Status: DC

## 2020-08-17 MED ORDER — ADULT MULTIVITAMIN W/MINERALS CH: 1.0000 | ORAL_TABLET | Freq: Every day | ORAL | Status: DC

## 2020-08-17 MED ORDER — LACTULOSE 10 GM/15ML PO SOLN: 20.0000 g | Freq: Three times a day (TID) | ORAL | 0 refills | Status: DC

## 2020-08-17 MED ORDER — DOLUTEGRAVIR SODIUM 50 MG PO TABS: 50.0000 mg | ORAL_TABLET | Freq: Every day | ORAL | 0 refills | Status: DC

## 2020-08-17 MED ORDER — POLYETHYLENE GLYCOL 3350 17 G PO PACK: 17.0000 g | PACK | Freq: Every day | ORAL | 0 refills | Status: DC | PRN

## 2020-08-17 MED ORDER — HALOPERIDOL LACTATE 5 MG/ML IJ SOLN
5.0000 mg | Freq: Four times a day (QID) | INTRAMUSCULAR | Status: DC | PRN
  Administered 2020-08-17 (×2): 5 mg via INTRAVENOUS
  Filled 2020-08-17 (×2): qty 1

## 2020-08-17 NOTE — Social Work (Signed)
Pt insurance Susan [name] has been approved. Reference number [id number]  12/15- 12/17 Care coordinator is Horace Porteous updated clinicals can be faxed to [phone].   Emeterio Reeve, Latanya Presser, [place] Social Worker [phone]

## 2020-08-17 NOTE — TOC Transition Note (Signed)
Transition of Care [facility]) - CM/SW Discharge Note   Patient Details  Name: Susan [name] MRN: [id number] Date of Birth: Nov 28, 1960  Transition of Care [facility]) CM/SW Contact:  Emeterio Reeve, [place] Phone Number: 08/17/2020, 3:06 PM   Clinical Narrative:     Pt will discharge to [facility] via [facility]. Pts daughter Thayer Headings has been notified. Pts covid is negative. CSW has requested ptar pickup.   Nurse to call report to [phone].  Final next level of care: Skilled Nursing Facility Barriers to Discharge: Barriers Resolved   Patient Goals and CMS Choice Patient states their goals for this hospitalization and ongoing recovery are:: Daughter states goals are for mother to get better before returning home CMS Medicare.gov Compare Post Acute Care list provided to:: Patient Choice offered to / list presented to : Patient  Discharge Placement              Patient chooses bed at: [facility] Patient to be transferred to facility by: ptar Name of family member notified: Aquinna, daughter Patient and family notified of of transfer: 08/17/20  Discharge Plan and Services In-house Referral: NA Discharge Planning Services: CM Consult Post Acute Care Choice: NA          DME Arranged: N/A DME Agency: NA       HH Arranged: NA HH Agency: NA        Social Determinants of Health ([place]) Interventions     Readmission Risk Interventions Readmission Risk Prevention Plan 03/11/2020 02/19/2019  Transportation Screening Complete Complete  Medication Review Press photographer) Referral to Pharmacy Complete  PCP or Specialist appointment within 3-5 days of discharge Complete Complete  HRI or [place] Complete Complete  SW Recovery Care/Counseling Consult Complete Complete  Palliative Care Screening Not Applicable Not [place] Complete Not Applicable  Some recent data might be hidden    Emeterio Reeve, [place], [place] Social  Worker [phone]

## 2020-08-17 NOTE — Discharge Summary (Addendum)
Physician Discharge Summary  Susan [name] QJF:[id number] DOB: May 17, 1961 DOA: 07/20/2020  PCP: Myrlene Broker, MD  Admit date: 07/20/2020 Discharge date: 08/17/2020  Disposition: Skilled nursing facility  Recommendations for Outpatient Follow-up:  1. Follow up with neurology in 8 weeks.    Discharge Condition: Stable CODE STATUS: Full code Diet recommendation: Diabetic diet, dysphagia 3 with thin liquids.  Brief/Interim Summary: 59 year old female with extensive medical history and psychiatric history with size affective disorder/bipolar disorder, seizure, essential hypertension, diabetes, type II HIV, polysubstance abuse history of stroke in August 2021 notable for prolonged hospitalization due to sepsis with respiratory failure during that time and had trach and PEG which has been reversed since, comes to the hospital for acute confusion on 07/29/2020.  Patient was admitted and treated for acute encephalopathy, there was concern for stroke, felt to be multifactorial in etiology due to seizures/hypertension/medication induced, underwent extensive work-up and evaluation EEG is consistent with status epilepticus, seizures have now been controlled, seen by psychiatry and medication has been adjusted.    Was also evaluated by PT OT .  Acute encephalopathy    felt to be multifactorial due to seizures/accelerated hypertension/PR ES and also with baseline schizophrenia: Question hallucinating has been conversing with the people not physically present in the room.    Followed by Dr Melynda Ripple- who has adjusted her medications and decreased Keppra to help with excessive somnolence..  Patient had extensive work-up including MRI of the brain that was compatible with PRES. Palliative care closely involved and family wants to have full code/aggressive care.  Psychiatry was consulted again on 12/9 and no need for inpatient psychiatric admission.  Continue current medications.   Schizophrenia/psych  affective disorder/bipolar: Seen by psychiatry multiple times, continue current regiment and no need for inpatient psychiatric hospitalization.    New onset epilepsy/in the background of chronic stroke: EEG showed discharges C/W status epilepticus, currently on phenobarbital, Keppra, Depakote.    Followed by Dr. Melynda Ripple plan is to increase dose of valproic acid if she has recurrent seizure episodes.  Dysphagia, initially failed swallow evaluation seen by speech and has now been placed on dysphagia 3 diet since, tolerating.  Episode of hypoglycemia but has not recurred.  Hypoglycemic w/ dysphagia needing multiple snacks.  No more hypoglycemic episodes..  Continue current Levemir.  Essential hypertension/PRES: BP is well controlled on multiple regimen including clonidine 0.2 mg twice daily, amlodipine,maxizide  HIV/AIDS:continue HART therapy repeat CD4 count outpatient, ID has seen the patient and meds were adjusted this admission.  Uncontrolled type 2 diabetes w/ uncontrolled hemoglobin A1c 7.7, no more hypoglycemia after adjusting Levemir.   Last Labs          Recent Labs  Lab 08/15/20 1952 08/15/20 2335 08/16/20 0459 08/16/20 0750 08/16/20 1159  GLUCAP 205* 212* 97 96 181*     Hyperkalemia has resolved    Discharge Diagnoses:  Active Problems:   Essential hypertension   Schizoaffective disorder, bipolar type (HCC)   GERD (gastroesophageal reflux disease)   Uncontrolled type 2 diabetes mellitus with hyperglycemia, with long-term current use of insulin (HCC)   Human immunodeficiency virus (HIV) disease (HCC)   Metabolic encephalopathy   PRES (posterior reversible encephalopathy syndrome)   Palliative care encounter    Discharge Instructions  Discharge Instructions    Diet Carb Modified   Complete by: As directed    Dysphagia 3 with thin liquids.   Increase activity slowly   Complete by: As directed      Allergies as of 08/17/2020  Reactions   Lyrica  [pregabalin] Swelling   Has LE swelling      Medication List    STOP taking these medications   aspirin 81 MG chewable tablet   baclofen 10 MG tablet Commonly known as: LIORESAL   benztropine 0.5 MG tablet Commonly known as: COGENTIN   Biktarvy 50-200-25 MG Tabs tablet Generic drug: bictegravir-emtricitabine-tenofovir AF   carvedilol 3.125 MG tablet Commonly known as: Coreg   doxepin 50 MG capsule Commonly known as: SINEQUAN   fluconazole 100 MG tablet Commonly known as: Diflucan   gabapentin 100 MG capsule Commonly known as: Neurontin   hydrOXYzine 25 MG capsule Commonly known as: VISTARIL   Lantus SoloStar 100 UNIT/ML Solostar Pen Generic drug: insulin glargine   metFORMIN 500 MG tablet Commonly known as: GLUCOPHAGE   QUEtiapine 200 MG tablet Commonly known as: SEROQUEL     TAKE these medications   (feeding supplement) PROSource Plus liquid Take 30 mLs by mouth 2 (two) times daily between meals. Start taking on: August 18, 2020   abacavir-dolutegravir-lamiVUDine 600-50-300 MG tablet Commonly known as: TRIUMEQ Take 1 tablet by mouth in the morning. Start taking on: August 18, 2020   Accu-Chek Aviva Connect w/Device Kit Please use Accu-Chek Aviva to check blood sugars 1-3 times a day   Accu-Chek Guide test strip Generic drug: glucose blood USE AS DIRECTED 1-3 TIMES DAILY.   acetaminophen 325 MG tablet Commonly known as: TYLENOL Take 650 mg by mouth every 6 (six) hours as needed for mild pain.   albuterol 108 (90 Base) MCG/ACT inhaler Commonly known as: VENTOLIN HFA INHALE 2 PUFFS INTO LUNGS EVERY SIX HOURS AS NEEDED FOR WHEEZING OR SHORTNESS OF BREATH What changed: See the new instructions.   amLODipine 10 MG tablet Commonly known as: NORVASC Take 1 tablet (10 mg total) by mouth daily.   Anoro Ellipta 62.5-25 MCG/INH Aepb Generic drug: umeclidinium-vilanterol INHALE 1 PUFF INTO THE LUNGS DAILY What changed: See the new instructions.    Asenapine Maleate 2.5 MG Subl Commonly known as: SAPHRIS Place 2 tablets (5 mg total) under the tongue 2 (two) times daily as needed (agitation/psychosis).   busPIRone 15 MG tablet Commonly known as: BUSPAR Take 15 mg by mouth 3 (three) times daily as needed (for anxiety).   cloNIDine 0.2 MG tablet Commonly known as: CATAPRES Take 0.2 mg by mouth 2 (two) times daily.   dolutegravir 50 MG tablet Commonly known as: TIVICAY Take 1 tablet (50 mg total) by mouth at bedtime.   insulin detemir 100 UNIT/ML injection Commonly known as: LEVEMIR Inject 0.07 mLs (7 Units total) into the skin 2 (two) times daily.   insulin lispro 100 UNIT/ML injection Commonly known as: HUMALOG Before each meal 3 times a day, 140-199 - 2 units, 200-250 - 4 units, 251-299 - 6 units,  300-349 - 8 units,  350 or above 10 units. Insulin PEN if approved, provide syringes and needles if needed. What changed:   how much to take  how to take this  when to take this  additional instructions   Lacosamide 150 MG Tabs Take 1 tablet (150 mg total) by mouth 2 (two) times daily.   lactulose 10 GM/15ML solution Commonly known as: CHRONULAC Take 30 mLs (20 g total) by mouth 3 (three) times daily.   levETIRAcetam 1000 MG tablet Commonly known as: KEPPRA Take 1 tablet (1,000 mg total) by mouth 2 (two) times daily.   multivitamin with minerals Tabs tablet Take 1 tablet by mouth daily. Start taking  on: August 18, 2020   OLANZapine 5 MG tablet Commonly known as: ZYPREXA Take 1 tablet (5 mg total) by mouth 2 (two) times daily.   omeprazole 40 MG capsule Commonly known as: PRILOSEC Take 1 capsule (40 mg total) by mouth daily.   Pen Needles 5/16" 30G X 8 MM Misc 1 each by Does not apply route daily.   PHENobarbital 64.8 MG tablet Commonly known as: LUMINAL Take 1 tablet (64.8 mg total) by mouth 2 (two) times daily.   polyethylene glycol 17 g packet Commonly known as: MIRALAX / GLYCOLAX Take 17 g by  mouth daily as needed for mild constipation.   rosuvastatin 20 MG tablet Commonly known as: CRESTOR TAKE 1 TABLET BY MOUTH DAILY   sertraline 100 MG tablet Commonly known as: ZOLOFT Take 1 tablet (100 mg total) by mouth at bedtime. What changed:   medication strength  how much to take   triamterene-hydrochlorothiazide 37.5-25 MG tablet Commonly known as: MAXZIDE-25 TAKE 1 TABLET BY MOUTH DAILY   valproic acid 250 MG capsule Commonly known as: DEPAKENE Take 4 capsules (1,000 mg total) by mouth 2 (two) times daily.       Contact information for follow-up providers    Garvin Fila, MD Follow up.   Specialties: Neurology, Radiology Why: Follow up in 8 weeks Contact information: [address] [place] [address] [phone]            Contact information for after-discharge care    [facility] .   Service: Skilled Nursing Contact information: [place] [address] [phone]                 Allergies  Allergen Reactions  . Lyrica [Pregabalin] Swelling    Has LE swelling    Consultations:  Neurology  Infectious disease  Psychiatry  Palliative care  Critical care   Procedures/Studies: DG Abd 1 View  Result Date: 08/03/2020 CLINICAL DATA:  NG tube placement EXAM: ABDOMEN - 1 VIEW COMPARISON:  Radiograph 07/29/2020, CT 07/20/2020 FINDINGS: Imaging completely encompasses the abdomen. Transesophageal tube tip terminates near the gastric antrum/duodenal bulb with side port distal to the GE junction. Multiple air-filled loops of large and small bowel without frank distention. No suspicious calcifications. Degenerative changes in the spine and pelvis as imaged. IMPRESSION: Transesophageal tube tip terminates near the gastric antrum/duodenal bulb with side port distal to the GE junction. Multiple air-filled loops of bowel without high-grade obstruction. A mild ileus or partial  obstruction is not excluded. Electronically Signed   By: Lovena Le M.D.   On: 08/03/2020 18:31   MR BRAIN W WO CONTRAST  Result Date: 07/29/2020 CLINICAL DATA:  Initial evaluation for acute seizure, abnormal neuro exam. EXAM: MRI HEAD WITHOUT AND WITH CONTRAST TECHNIQUE: Multiplanar, multiecho pulse sequences of the brain and surrounding structures were obtained without and with intravenous contrast. CONTRAST:  70mL GADAVIST GADOBUTROL 1 MMOL/ML IV SOLN COMPARISON:  Prior CT from 07/20/2020 as well as most recent MRI from 03/09/2020. FINDINGS: Brain: Cerebral volume stable since previous exam. Multifocal areas of encephalomalacia and gliosis involving the bilateral parietal regions consistent with chronic posterior MCA territory infarcts. Additional chronic right PCA and/or PCA/MCA watershed distribution infarct noted at the parasagittal right parietal lobe as well. Scattered areas of associated laminar necrosis and/or chronic hemosiderin staining noted within these regions. There has been interval development of extensive fairly widespread abnormal T2/FLAIR signal abnormality involving both cerebral hemispheres, left greater  than right. The CNS white matter is primarily involved, with involvement of both frontal and parietal regions. Patchy involvement extends into the left basal ganglia. Abnormal T2/FLAIR signal abnormality noted within the right greater than left cerebellar hemispheres as well. Scattered areas of patchy diffusion abnormality and enhancement seen throughout the affected areas. Additionally, multiple new scattered foci of susceptibility artifacts seen along the gray-white matter differentiation of the affected areas as well, suggesting blood products and breakdown of the blood-brain barrier. Given the widespread nature of these findings and history of seizure, findings felt to be most consistent with PRES. Additional note made of abnormal T2/FLAIR signal intensity with diffusion abnormality  involving the mesial left temporal lobe/hippocampus, which could also be related to PRES and/or acute seizure (series 5, image 71). No significant regional mass effect or midline shift. No other evidence for acute vascular infarct. No frank or discrete intraparenchymal hematoma or hemorrhagic transformation. No mass lesion or extra-axial fluid collection. Pituitary gland suprasellar region within normal limits. Midline structures intact. Vascular: Major intracranial vascular flow voids are grossly maintained. Persistent left trigeminal artery again noted. Skull and upper cervical spine: Craniocervical junction within normal limits. Bone marrow signal intensity normal. No scalp soft tissue abnormality. Sinuses/Orbits: Globes and orbital soft tissues grossly within normal limits. Paranasal sinuses are clear. Small left greater than right mastoid effusions. Inner ear structures grossly normal. Other: None. IMPRESSION: 1. Interval development of extensive abnormal T2/FLAIR signal abnormality involving the left greater than right cerebral hemispheres as well as the cerebellum, with associated patchy diffusion abnormality and enhancement. Given the history of seizure, findings felt to be most consistent with PRES. 2. Superimposed abnormal T2/FLAIR signal intensity with diffusion abnormality involving the mesial left temporal lobe/hippocampus, which could also be related to PRES and/or acute seizure/status epilepticus. 3. Underlying chronic bilateral posterior MCA and right PCA/MCA distribution infarcts. Electronically Signed   By: Jeannine Boga M.D.   On: 07/29/2020 02:20   DG Chest Port 1 View  Result Date: 07/20/2020 CLINICAL DATA:  59 year old female with altered mental status. EXAM: PORTABLE CHEST 1 VIEW COMPARISON:  Chest radiograph dated 03/08/2020. FINDINGS: The lungs are clear. There is no pleural effusion pneumothorax. The cardiac silhouette is within normal limits. Atherosclerotic calcification of  the aortic arch. No acute osseous pathology. IMPRESSION: No active disease. Electronically Signed   By: Anner Crete M.D.   On: 07/20/2020 17:04   DG Abd Portable 1V  Result Date: 07/29/2020 CLINICAL DATA:  NG tube placement EXAM: PORTABLE ABDOMEN - 1 VIEW COMPARISON:  11/24/2019 FINDINGS: Esophageal tube tip overlies the distal stomach. The lung bases are clear. Upper gas pattern is nonobstructed IMPRESSION: Esophageal tube tip overlies the distal stomach. Electronically Signed   By: Donavan Foil M.D.   On: 07/29/2020 20:30   EEG adult  Result Date: 07/23/2020 Lora Havens, MD     07/23/2020  1:42 PM Patient Name: FONDA ROCHON MRN: [id number] Epilepsy Attending: Lora Havens Referring Physician/Provider: Dr Sander Radon Date: 07/23/2020 Duration: 29.37 mins Patient history:  59 y.o. female PMHx of old strokes/TIAs, schizophrenia, IDDM II, anxiety and depression presented with ams. EEG to evaluate for seizure Level of alertness: lethargic AEDs during EEG study: None Technical aspects: This EEG study was done with scalp electrodes positioned according to the 10-20 International system of electrode placement. Electrical activity was acquired at a sampling rate of _0  and reviewed with a high frequency filter of _1  and a low frequency filter of _2 . EEG data were recorded continuously  and digitally stored. Description: No posterior dominant rhythm was seen. EEG showed generalized periodic epileptiform discharges at 0._0 , maximal right frontal region as well as  continuous generalized 3 to 6 Hz theta-delta slowing. Hyperventilation and photic stimulation were not performed.   ABNORMALITY -Periodic epileptiform discharges, generalized and maximal right frontal region -Continuous slow, generalized IMPRESSION: This study showed evidence of generalized, maximal right frontal epileptogenicity likely due to underlying structural abnormality. There is also moderate diffuse encephalopathy, non  specific etiology. No seizures were seen during this study. Dr Earnestine Leys was notified. Priyanka [name]   Overnight EEG with video  Result Date: 07/24/2020 Lora Havens, MD     07/25/2020 10:19 AM Patient Name: BOBBIE VIRDEN MRN: [id number] Epilepsy Attending: Lora Havens Referring Physician/Provider: Dr Kerney Elbe Duration: 07/23/2020 1401 to 07/24/2020 1401  Patient history: 59 y.o.femalePMHx of old strokes/TIAs, schizophrenia, IDDM II, anxiety and depression presented with ams. EEG to evaluate for seizure  Level of alertness: lethargic  AEDs during EEG study: VPA  Technical aspects: This EEG study was done with scalp electrodes positioned according to the 10-20 International system of electrode placement. Electrical activity was acquired at a sampling rate of _1  and reviewed with a high frequency filter of _2  and a low frequency filter of _3 . EEG data were recorded continuously and digitally stored.  Description: No posterior dominant rhythm was seen. EEG showed generalized periodic epileptiform discharges at 0._4 , maximal right frontal region as well as continuous generalized 3 to 6 Hz theta-delta slowing. Multiple seizures were recorded during the study, last seizure on 07/25/2020 at 0913.  During some of the seizures, patient was asleep and no clinical signs were noted.  At other times, patient was noted to have side to side head movement followed by non versive LEFT gaze followed by head deviation.  During one of the seizures at [place] on 07/24/2020 patient was also noted to have right upper and lower extremity jerking, stopped talking and then resumed talking after the seizure ended.  Average duration of seizure was about 1 minute.  Concomitant eeg during all the seizures showed right frontal rhythmic 3-_5  theta-delta slowing admixed with periodic sharp waves in right frontal region at _6  which gradually increase in frequency to 8-_7  alpha activity and involve left hemisphere.  ABNORMALITY - Seizure, right frontal region - Periodic epileptiform discharges, generalized and maximal right frontal region - Continuous slow, generalized  IMPRESSION: This study showed multiple seizures with different semiology as described above arising from right frontal region, average duration about 1 minute, last seizure on 07/24/2020 at 0913 . There is also evidence of generalized, maximal right frontal epileptogenicity likely due to underlying structural abnormality. Additionally there is moderate to severe diffuse encephalopathy, non specific etiology. Lora Havens   CT Angio Chest/Abd/Pel for Dissection W and/or W/WO  Result Date: 07/20/2020 CLINICAL DATA:  Abdominal pain, dissection suspected EXAM: CT ANGIOGRAPHY CHEST, ABDOMEN AND PELVIS TECHNIQUE: Non-contrast CT of the chest was initially obtained. Multidetector CT imaging through the chest, abdomen and pelvis was performed using the standard protocol during bolus administration of intravenous contrast. Multiplanar reconstructed images and MIPs were obtained and reviewed to evaluate the vascular anatomy. CONTRAST:  156m OMNIPAQUE IOHEXOL 350 MG/ML SOLN COMPARISON:  Radiograph 07/20/2020, CT abdomen 12/03/2019 CT chest 02/17/2019, CT abdomen pelvis 04/17/2015 FINDINGS: CTA CHEST FINDINGS Cardiovascular: Noncontrast CT of the chest performed initially reveals an atherosclerotic aorta without abnormal plaque displacement or hyperdense mural thickening. Postcontrast administration there is satisfactory opacification of the aorta and  arterial branches. The aortic root is suboptimally assessed given cardiac pulsation artifact. Atherosclerotic plaque within the normal caliber aorta. No acute luminal abnormality of the imaged aorta. No periaortic stranding or hemorrhage. The right common carotid than left vertebral artery arises directly from the aortic arch with an aberrant right subclavian artery. Minimal plaque in the proximal great vessel  origins but without significant occlusion or acute vascular abnormality. Normal heart size. No pericardial effusion. Satisfactory opacification the pulmonary arteries to the segmental level. No large central filling defects are identified. More distal evaluation is limited by respiratory motion artifact which results in some likely artifactual filling defect for instance in the right lower lobe (6/90). Reflux of contrast into the superficial veins of the right upper extremity and chest wall with some questionable venous narrowing of the right subclavian vein at the thoracic inlet. Mediastinum/Nodes: No mediastinal fluid or gas. Elongated 1.5 by 1.0 cm nodule in the left thyroid gland. No acute abnormality of the trachea or esophagus. No worrisome mediastinal, hilar or axillary adenopathy. Lungs/Pleura: Atelectatic changes which are likely exacerbated by imaging during exhalation. Mild nonspecific mosaic attenuation may also be related to underinflation. Motion artifact limits detection of subtle abnormalities in the lungs. No clear focal consolidation, convincing features of edema, pneumothorax or visible effusion. No worrisome nodules or masses are visualized. Musculoskeletal: Multilevel degenerative changes are present in the imaged portions of the spine. Multilevel flowing anterior osteophytosis, compatible with features of diffuse idiopathic skeletal hyperostosis (DISH). No acute osseous abnormality or suspicious osseous lesion. No worrisome abnormality of the soft tissues of the chest wall. Some reflux of contrast is noted into the superficial tissues of the right shoulder. Review of the MIP images confirms the above findings. CTA ABDOMEN AND PELVIS FINDINGS VASCULAR Aorta: Minimal atherosclerotic plaque. No significant stenosis or occlusion. No acute luminal abnormality, specifically no evidence of aneurysm, dissection or vasculitis. Celiac: Ostial plaque without significant stenosis. Proximal branches  normally opacified. No evidence of aneurysm, dissection or vasculitis. SMA: Minimal ostial plaque. Proximal branches normally opacified. No evidence of aneurysm, dissection or vasculitis. Renals: Single renal arteries bilaterally. No aneurysm, dissection, vasculitis or features of fibromuscular dysplasia. IMA: Mild-to-moderate ostial plaque narrowing. Otherwise normally opacified with normal branching. No evidence of aneurysm, dissection or vasculitis. Inflow: Atherosclerotic plaque predominantly within the common and internal iliac artery branches bilaterally. No acute luminal abnormality. No evidence of aneurysm, dissection or vasculitis. Proximal outflow: The right proximal outflow system demonstrates some minimal plaque but is otherwise widely patent without acute luminal abnormality. The left proximal outflow system demonstrates some abrupt tapering of the profundus femora is just beyond the branching of the lateral circumflex femoral artery (6/309) with only thread-like opacification more distally. Veins: No obvious venous abnormality within the limitations of this arterial phase study. Review of the MIP images confirms the above findings. NON-VASCULAR Hepatobiliary: No worrisome focal liver lesions. Smooth liver surface contour. Diffuse hepatic hypoattenuation, possibly reflecting some hepatic steatosis versus artifactual related to arterial phase of imaging. Calcified gallstones present within the gallbladder lumen. No gallbladder wall thickening or pericholecystic inflammation. No biliary ductal dilatation or visible intraductal gallstones. Pancreas: No pancreatic ductal dilatation or surrounding inflammatory changes. Spleen: Few normal clefts.  Normal size.  No concerning lesion. Adrenals/Urinary Tract: Normal adrenal glands. Kidneys are normally located with symmetric enhancement. No suspicious renal lesion, urolithiasis or hydronephrosis. Mild bladder wall thickening is noted with some faint  perivesicular hazy stranding. Stomach/Bowel: Distal esophagus, stomach and duodenal sweep are unremarkable. No small bowel wall thickening or  dilatation. No evidence of obstruction. A normal appendix is visualized. No colonic dilatation or wall thickening. Lymphatic: No suspicious or enlarged lymph nodes in the included lymphatic chains. Reproductive: Normal appearance of the uterus and adnexal structures. Other: Mild body wall edema. No abdominopelvic free fluid or free gas. No bowel containing hernias. Linear band of soft tissue infiltration along the anterior abdominal wall the left upper quadrant (6/174) likely reflect tract of prior percutaneous gastrostomy. Musculoskeletal: Multilevel degenerative changes are present in the imaged portions of the spine. No acute osseous abnormality or suspicious osseous lesion. Review of the MIP images confirms the above findings. IMPRESSION: Vascular: 1. No evidence of acute aortic syndrome. 2. Aberrant right subclavian artery. Left vertebral artery and right common carotid arise directly from the arch. 3.  Aortic Atherosclerosis (ICD10-I70.0). 4. Minimal plaque narrowing of the celiac axis with more mild-to-moderate narrowing at the IMA origin. 5. Abrupt tapering of the left deep femoral artery just beyond the origin of the lateral circumflex femoral artery with only thread-like opacification more distally within the margins of imaging. 6. No large central pulmonary artery filling defects though more distal evaluation is limited by a non tailored technique and extensive respiratory motion. Nonvascular: 1. Atelectasis with nonspecific mosaic attenuation in the lungs, likely related to underinflation/imaging during exhalation for angiography. 2. Mild bladder wall thickening with some faint perivesicular hazy stranding, possibly related to underdistention though recommend correlation with urinalysis to exclude cystitis. 3. Cholelithiasis without evidence of acute cholecystitis.  4. Hepatic hypoattenuation, likely hepatic steatosis versus contrast timing affect. 5. Linear band of soft tissue infiltration along the anterior abdominal wall the left upper quadrant likely reflects track of prior percutaneous gastrostomy. Correlate with surgical history. Electronically Signed   By: Lovena Le M.D.   On: 07/20/2020 19:29   CT HEAD CODE STROKE WO CONTRAST  Result Date: 07/20/2020 CLINICAL DATA:  Code stroke. EXAM: CT HEAD WITHOUT CONTRAST TECHNIQUE: Contiguous axial images were obtained from the base of the skull through the vertex without intravenous contrast. COMPARISON:  03/08/2020 FINDINGS: Brain: There is no acute intracranial hemorrhage mass effect no definite new loss of gray-white differentiation. Multiple chronic infarcts are identified including involvement right parietal, temporal, and occipital lobes and left parietal and temporal lobes. Ex vacuo dilatation of adjacent lateral ventricles. No hydrocephalus. No extra-axial collection. Vascular: No hyperdense vessel. There is intracranial atherosclerotic calcification at the skull base. Skull: Unremarkable. Sinuses/Orbits: No acute abnormality. Other: Mastoid air cells are clear. IMPRESSION: There is no acute intracranial hemorrhage or evidence of acute infarction. Multiple chronic infarcts. These results were communicated to Dr. Cheral Marker at 3:48 pmon 11/17/2021by text page via the [facility] messaging system. Electronically Signed   By: Macy Mis M.D.   On: 07/20/2020 15:51   [place] EKG SITE RITE  Result Date: 07/29/2020 If Site Rite image not attached, placement could not be confirmed due to current cardiac rhythm.  [place] EKG SITE RITE  Result Date: 07/29/2020 If Site Rite image not attached, placement could not be confirmed due to current cardiac rhythm.      Subjective: No new complaints by the patient.   Discharge Exam: Lying in bed, no form of pain or respiratory distress.  Alert and following basic  commands. Vitals:   08/16/20 2212 08/17/20 0502  BP: 138/80 116/81  Pulse: 88 81  Resp: 18 19  Temp: 98.3 F (36.8 C) 99 F (37.2 C)  SpO2: 100% 100%   Vitals:   08/16/20 0501 08/16/20 1317 08/16/20 2212 08/17/20 0502  BP: (!) 150/82 (!) 149/94 138/80 116/81  Pulse: 80 85 88 81  Resp: _0 Temp: 99.2 F (37.3 C) 98.2 F (36.8 C) 98.3 F (36.8 C) 99 F (37.2 C)  TempSrc: Oral Axillary Oral Oral  SpO2: 100% 99% 100% 100%  Weight:      Height:        General: Pt is alert, awake, not in acute distress Cardiovascular: RRR, S1/S2 +, no rubs, no gallops Respiratory: CTA bilaterally, no wheezing, no rhonchi Abdominal: Soft, NT, ND, bowel sounds + Extremities: no edema, no cyanosis    The results of significant diagnostics from this hospitalization (including imaging, microbiology, ancillary and laboratory) are listed below for reference.     Microbiology: Recent Results (from the past 240 hour(s))  SARS Coronavirus 2 by RT PCR (hospital order, performed in [facility] hospital lab) Nasopharyngeal Nasopharyngeal Swab     Status: None   Collection Time: 08/16/20  9:00 PM   Specimen: Nasopharyngeal Swab  Result Value Ref Range Status   SARS Coronavirus 2 NEGATIVE NEGATIVE Final    Comment: (NOTE) SARS-CoV-2 target nucleic acids are NOT DETECTED.  The SARS-CoV-2 RNA is generally detectable in upper and lower respiratory specimens during the acute phase of infection. The lowest concentration of SARS-CoV-2 viral copies this assay can detect is 250 copies / mL. A negative result does not preclude SARS-CoV-2 infection and should not be used as the sole basis for treatment or other patient management decisions.  A negative result may occur with improper specimen collection / handling, submission of specimen other than nasopharyngeal swab, presence of viral mutation(s) within the areas targeted by this assay, and inadequate number of viral copies (<250 copies / mL). A  negative result must be combined with clinical observations, patient history, and epidemiological information.  Fact Sheet for Patients:   StrictlyIdeas.no  Fact Sheet for Healthcare Providers: BankingDealers.co.za  This test is not yet approved or  cleared by the [place] FDA and has been authorized for detection and/or diagnosis of SARS-CoV-2 by FDA under an Emergency Use Authorization (EUA).  This EUA will remain in effect (meaning this test can be used) for the duration of the COVID-19 declaration under Section 564(b)(1) of the Act, 21 U.S.C. section 360bbb-3(b)(1), unless the authorization is terminated or revoked sooner.  Performed at [facility], [place] [address], [place], [place] [address]      Labs: BNP (last 3 results) Recent Labs    11/13/19 1116 11/17/19 0341  BNP 798.4* 035.5*   Basic Metabolic Panel: Recent Labs  Lab 08/13/20 0504  NA 139  K 3.9  CL 99  CO2 31  GLUCOSE 130*  BUN 18  CREATININE 0.70  CALCIUM 9.2   Liver Function Tests: Recent Labs  Lab 08/13/20 0504  AST 17  ALT 20  ALKPHOS 78  BILITOT 0.5  PROT 6.3*  ALBUMIN 2.7*   No results for input(s): LIPASE, AMYLASE in the last 168 hours. No results for input(s): AMMONIA in the last 168 hours. CBC: Recent Labs  Lab 08/13/20 0504  WBC 7.9  HGB 13.4  HCT 39.3  MCV 87.3  PLT 319   Cardiac Enzymes: No results for input(s): CKTOTAL, CKMB, CKMBINDEX, TROPONINI in the last 168 hours. BNP: Invalid input(s): POCBNP CBG: Recent Labs  Lab 08/16/20 1602 08/16/20 2208 08/17/20 0032 08/17/20 0725 08/17/20 1210  GLUCAP 219* 181* 142* 127* 172*   D-Dimer No results for input(s): DDIMER in the last 72 hours. Hgb  A1c No results for input(s): HGBA1C in the last 72 hours. Lipid Profile No results for input(s): CHOL, HDL, LDLCALC, TRIG, CHOLHDL, LDLDIRECT in the last 72 hours. Thyroid function studies No results for input(s):  TSH, T4TOTAL, T3FREE, THYROIDAB in the last 72 hours.  Invalid input(s): FREET3 Anemia work up No results for input(s): VITAMINB12, FOLATE, FERRITIN, TIBC, IRON, RETICCTPCT in the last 72 hours. Urinalysis    Component Value Date/Time   COLORURINE YELLOW 07/20/2020 2210   APPEARANCEUR CLEAR 07/20/2020 2210   LABSPEC 1.028 07/20/2020 2210   PHURINE 8.0 07/20/2020 2210   GLUCOSEU 50 (A) 07/20/2020 2210   HGBUR NEGATIVE 07/20/2020 2210   BILIRUBINUR NEGATIVE 07/20/2020 2210   KETONESUR NEGATIVE 07/20/2020 2210   PROTEINUR NEGATIVE 07/20/2020 2210   UROBILINOGEN 1.0 06/01/2015 0104   NITRITE NEGATIVE 07/20/2020 2210   LEUKOCYTESUR NEGATIVE 07/20/2020 2210   Sepsis Labs Invalid input(s): PROCALCITONIN,  WBC,  LACTICIDVEN Microbiology Recent Results (from the past 240 hour(s))  SARS Coronavirus 2 by RT PCR (hospital order, performed in [place] hospital lab) Nasopharyngeal Nasopharyngeal Swab     Status: None   Collection Time: 08/16/20  9:00 PM   Specimen: Nasopharyngeal Swab  Result Value Ref Range Status   SARS Coronavirus 2 NEGATIVE NEGATIVE Final    Comment: (NOTE) SARS-CoV-2 target nucleic acids are NOT DETECTED.  The SARS-CoV-2 RNA is generally detectable in upper and lower respiratory specimens during the acute phase of infection. The lowest concentration of SARS-CoV-2 viral copies this assay can detect is 250 copies / mL. A negative result does not preclude SARS-CoV-2 infection and should not be used as the sole basis for treatment or other patient management decisions.  A negative result may occur with improper specimen collection / handling, submission of specimen other than nasopharyngeal swab, presence of viral mutation(s) within the areas targeted by this assay, and inadequate number of viral copies (<250 copies / mL). A negative result must be combined with clinical observations, patient history, and epidemiological information.  Fact Sheet for Patients:    StrictlyIdeas.no  Fact Sheet for Healthcare Providers: BankingDealers.co.za  This test is not yet approved or  cleared by the [place] FDA and has been authorized for detection and/or diagnosis of SARS-CoV-2 by FDA under an Emergency Use Authorization (EUA).  This EUA will remain in effect (meaning this test can be used) for the duration of the COVID-19 declaration under Section 564(b)(1) of the Act, 21 U.S.C. section 360bbb-3(b)(1), unless the authorization is terminated or revoked sooner.  Performed at [facility], [place] [address], [place], [place] [address]      Time coordinating discharge: 40 minutes  SIGNED:   Elvin So, MD  [facility] 08/17/2020, 2:02 PM

## 2020-08-23 ENCOUNTER — Emergency Department (HOSPITAL_COMMUNITY): Payer: Medicare Other

## 2020-08-23 ENCOUNTER — Emergency Department (HOSPITAL_COMMUNITY)
Admission: EM | Admit: 2020-08-23 | Discharge: 2020-08-23 | Disposition: A | Discharge status: Home / Self Care | Payer: Medicare Other | Attending: Emergency Medicine | Admitting: Emergency Medicine

## 2020-08-23 ENCOUNTER — Other Ambulatory Visit: Payer: Self-pay

## 2020-08-23 DIAGNOSIS — Z79899 Other long term (current) drug therapy: Secondary | ICD-10-CM | POA: Diagnosis not present

## 2020-08-23 DIAGNOSIS — R296 Repeated falls: Secondary | ICD-10-CM

## 2020-08-23 DIAGNOSIS — I10 Essential (primary) hypertension: Secondary | ICD-10-CM | POA: Diagnosis not present

## 2020-08-23 DIAGNOSIS — E1140 Type 2 diabetes mellitus with diabetic neuropathy, unspecified: Secondary | ICD-10-CM | POA: Diagnosis not present

## 2020-08-23 DIAGNOSIS — Z043 Encounter for examination and observation following other accident: Secondary | ICD-10-CM | POA: Insufficient documentation

## 2020-08-23 DIAGNOSIS — Y92122 Bedroom in nursing home as the place of occurrence of the external cause: Secondary | ICD-10-CM | POA: Diagnosis not present

## 2020-08-23 DIAGNOSIS — M2550 Pain in unspecified joint: Secondary | ICD-10-CM | POA: Diagnosis not present

## 2020-08-23 DIAGNOSIS — Z743 Need for continuous supervision: Secondary | ICD-10-CM | POA: Diagnosis not present

## 2020-08-23 DIAGNOSIS — R404 Transient alteration of awareness: Secondary | ICD-10-CM | POA: Diagnosis not present

## 2020-08-23 DIAGNOSIS — F17210 Nicotine dependence, cigarettes, uncomplicated: Secondary | ICD-10-CM | POA: Insufficient documentation

## 2020-08-23 DIAGNOSIS — W06XXXA Fall from bed, initial encounter: Secondary | ICD-10-CM | POA: Diagnosis not present

## 2020-08-23 DIAGNOSIS — R410 Disorientation, unspecified: Secondary | ICD-10-CM | POA: Insufficient documentation

## 2020-08-23 DIAGNOSIS — Z7401 Bed confinement status: Secondary | ICD-10-CM | POA: Diagnosis not present

## 2020-08-23 DIAGNOSIS — Z794 Long term (current) use of insulin: Secondary | ICD-10-CM | POA: Insufficient documentation

## 2020-08-23 DIAGNOSIS — W19XXXA Unspecified fall, initial encounter: Secondary | ICD-10-CM | POA: Diagnosis not present

## 2020-08-23 DIAGNOSIS — E1165 Type 2 diabetes mellitus with hyperglycemia: Secondary | ICD-10-CM | POA: Insufficient documentation

## 2020-08-23 DIAGNOSIS — J45909 Unspecified asthma, uncomplicated: Secondary | ICD-10-CM | POA: Diagnosis not present

## 2020-08-23 DIAGNOSIS — S0990XA Unspecified injury of head, initial encounter: Secondary | ICD-10-CM | POA: Diagnosis not present

## 2020-08-23 IMAGING — CR DG LUMBAR SPINE COMPLETE 4+V
5 series · 5 of 5 positions shown · non-contrast
Comparison: [DATE]

CLINICAL DATA: Un witnessed fall

EXAM:
LUMBAR SPINE - COMPLETE 4+ VIEW

[t lumbar spine ap]
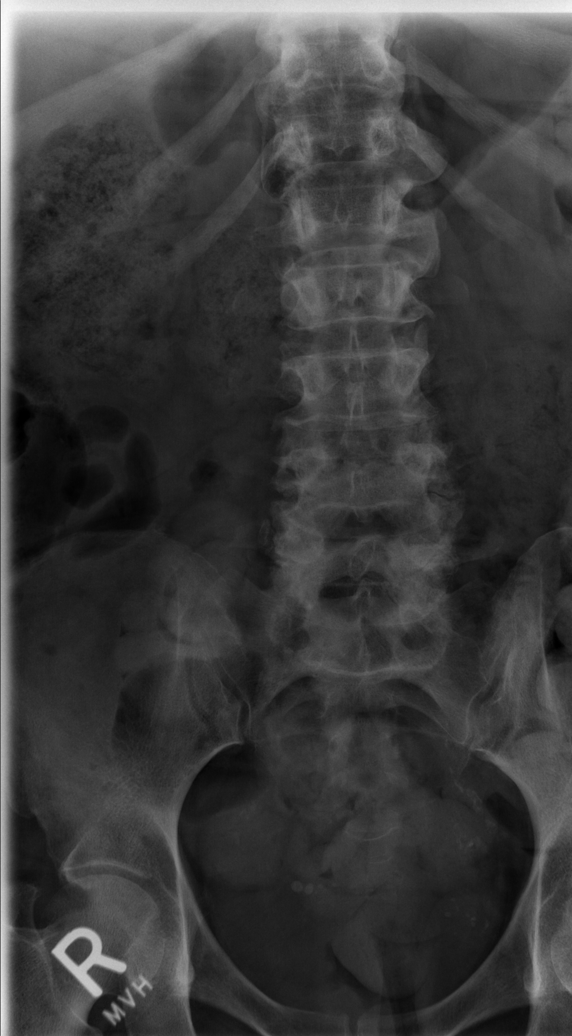

[t lumbar spine obl (1 of 2)]
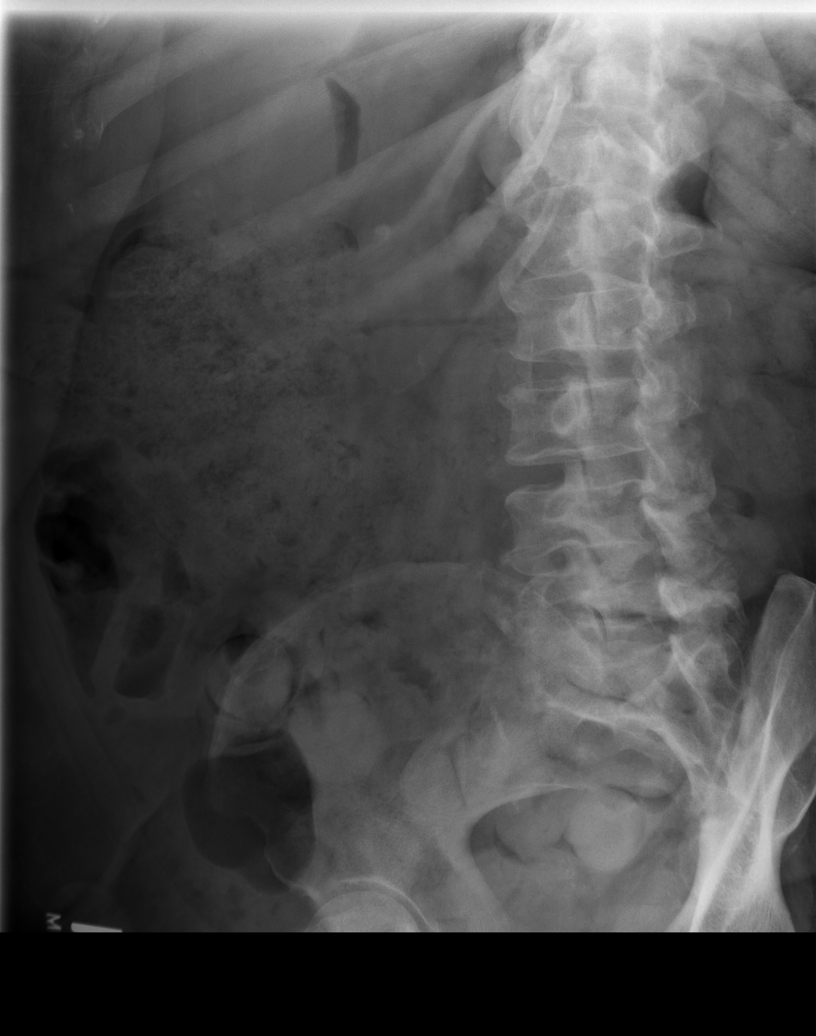

[t lumbar spine obl (2 of 2)]
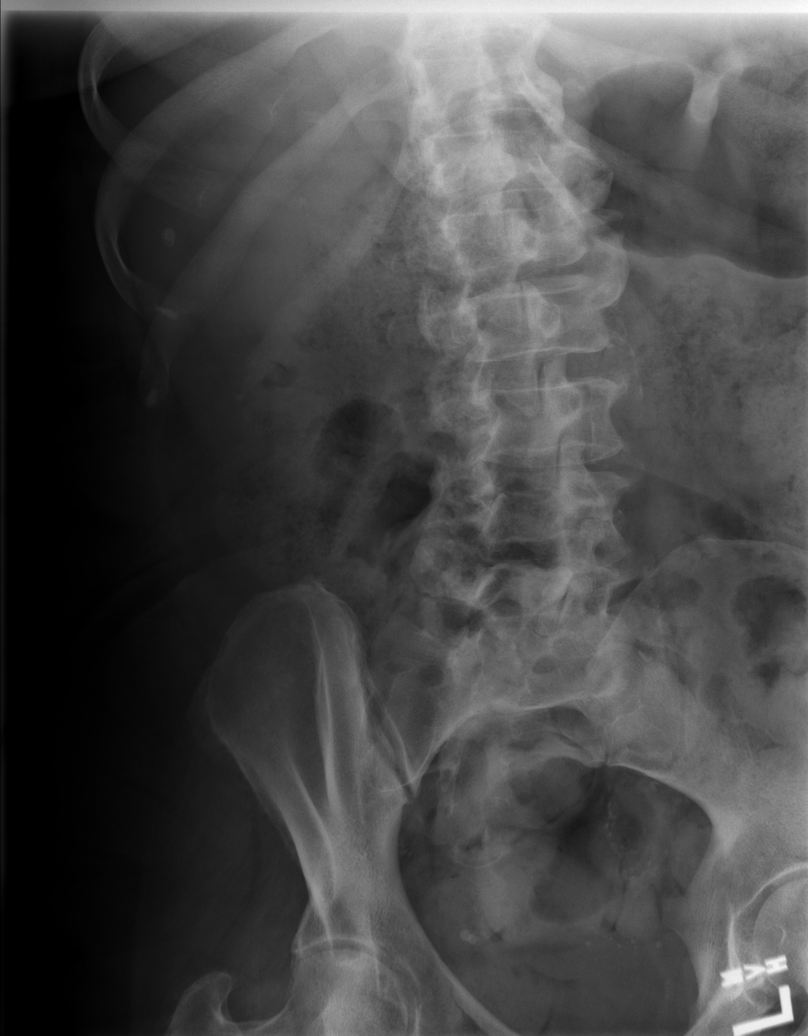

[t lumbar spine lat]
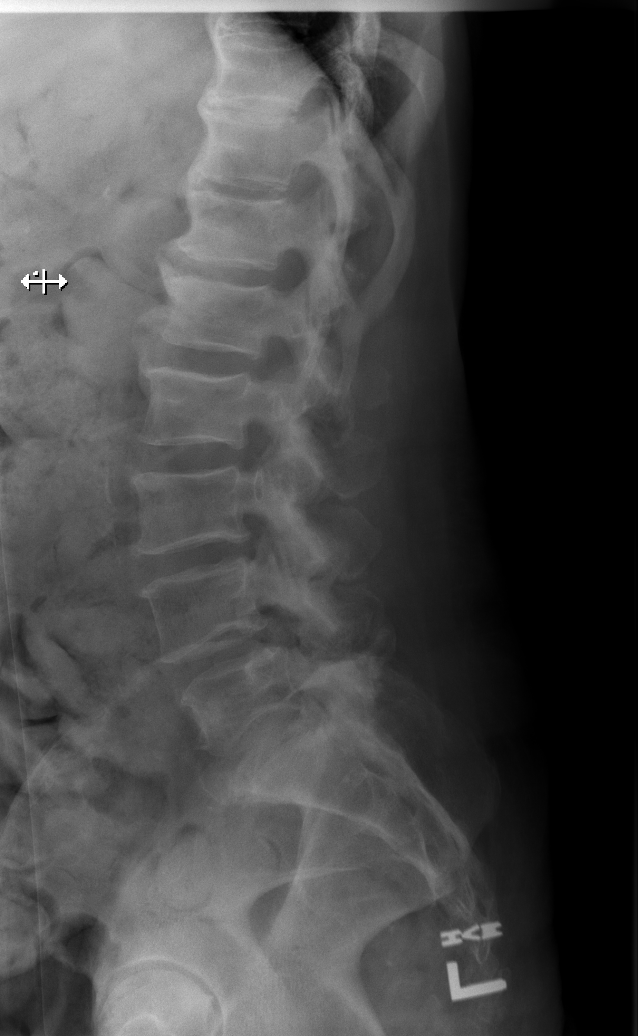

[t lumbar l-5 s-1 spot]
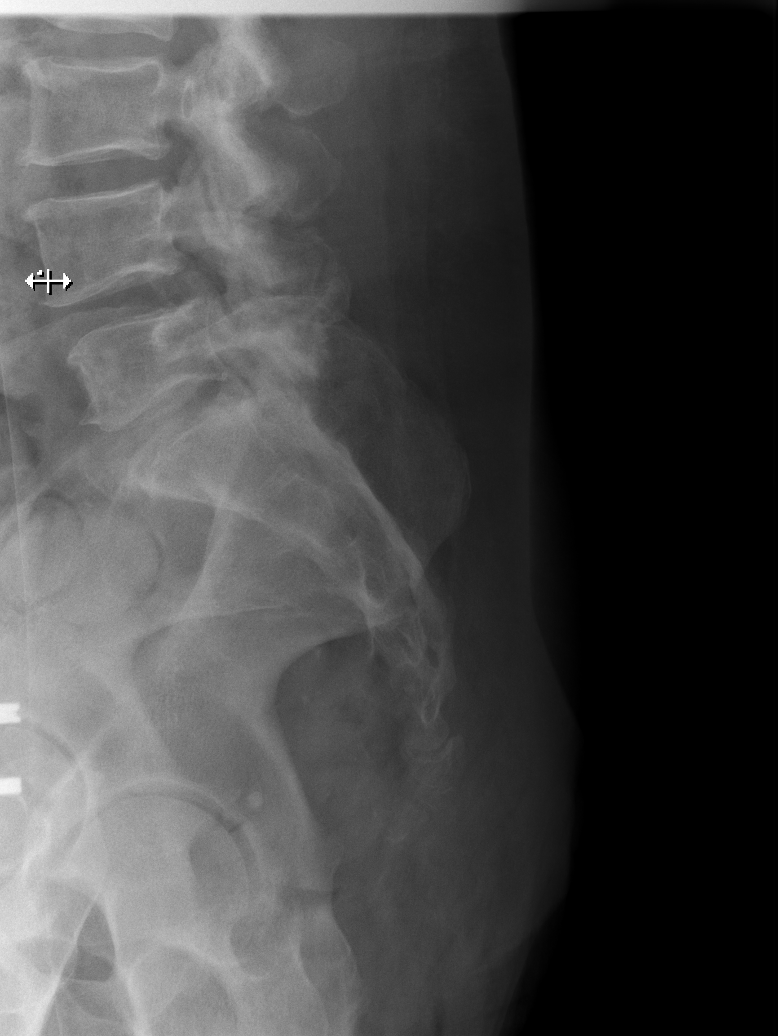

[5 of 5 positions shown; findings below may reference images not displayed]

FINDINGS: Frontal, bilateral oblique, lateral views of the lumbar spine are
obtained. 5 non-rib-bearing lumbar type vertebral bodies are in
anatomic alignment. There are no acute fractures. There is prominent
spondylosis at the thoracolumbar junction. Significant facet
hypertrophic changes are seen at the lumbosacral junction.
Sacroiliac joints are normal. Moderate stool throughout the colon.
IMPRESSION: 1. Multilevel thoracolumbar spondylosis and facet hypertrophy. No
acute fracture.

## 2020-08-23 IMAGING — CT CT HEAD W/O CM
3 series · 15 of 47 positions shown, 18 images · non-contrast
Comparison: [DATE]

CLINICAL DATA: Unwitnessed fall with trauma to the right side of
the head and face.

EXAM:
CT HEAD WITHOUT CONTRAST
TECHNIQUE: Contiguous axial images were obtained from the base of the skull
through the vertex without intravenous contrast.

[Series 2: head wo · axial · 0.41mm/px · z∈[-85,+60]mm · 9 of 35 slices shown, 12 images]
[im 3/35  brain]
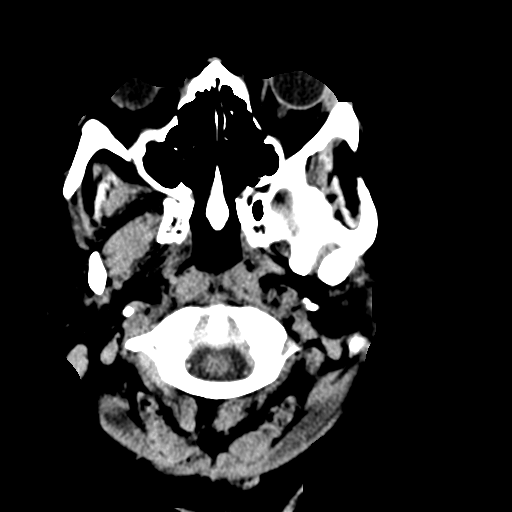
[im 3/35  bone]
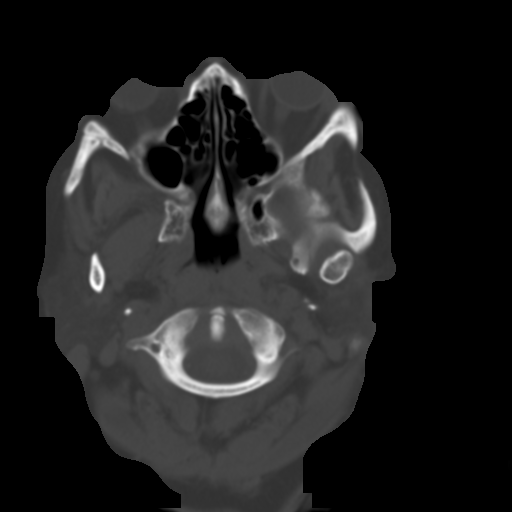
[im 6/35  brain]
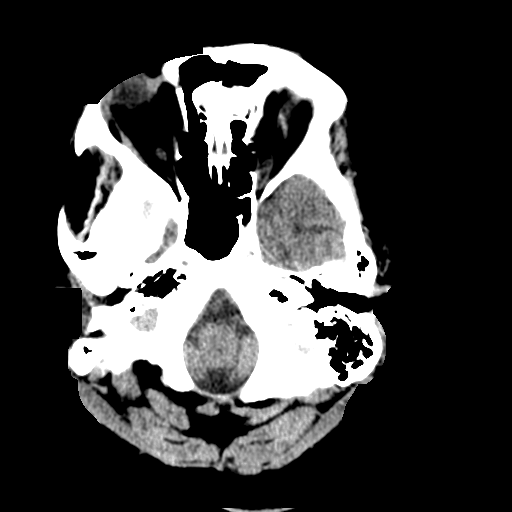
[im 10/35  brain]
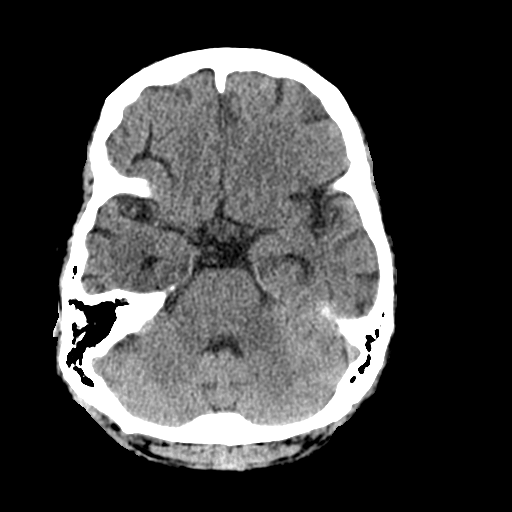
[im 13/35  brain]
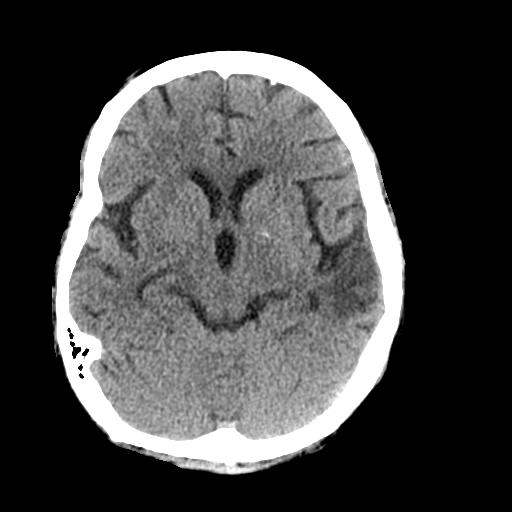
[im 18/35  brain]
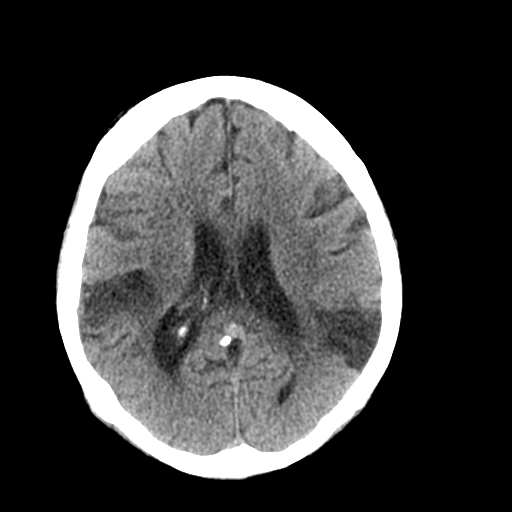
[im 18/35  bone]
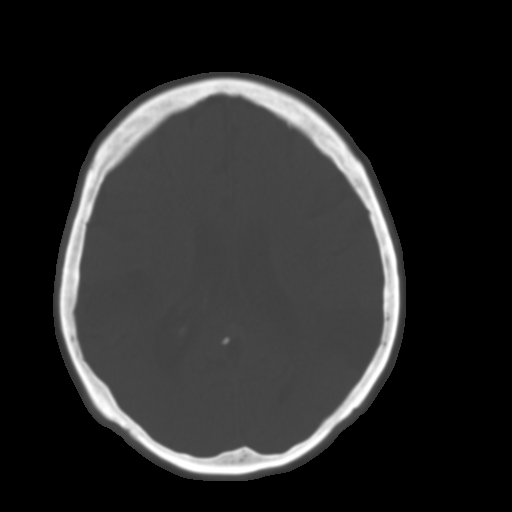
[im 22/35  brain]
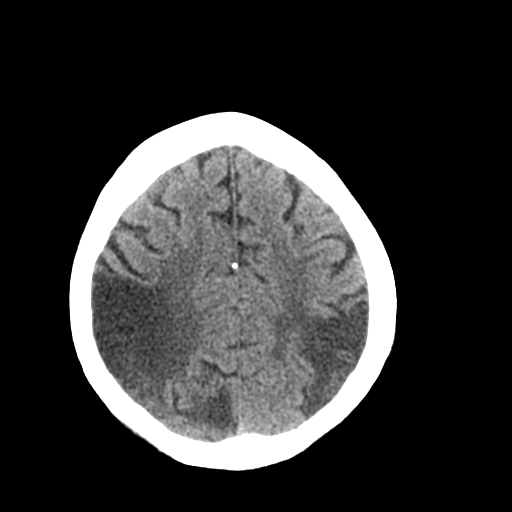
[im 25/35  brain]
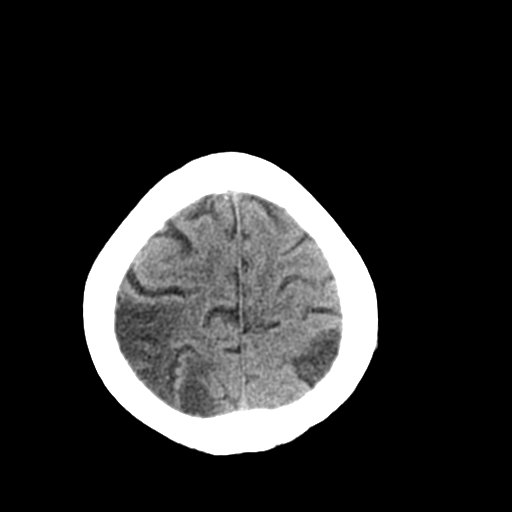
[im 29/35  brain]
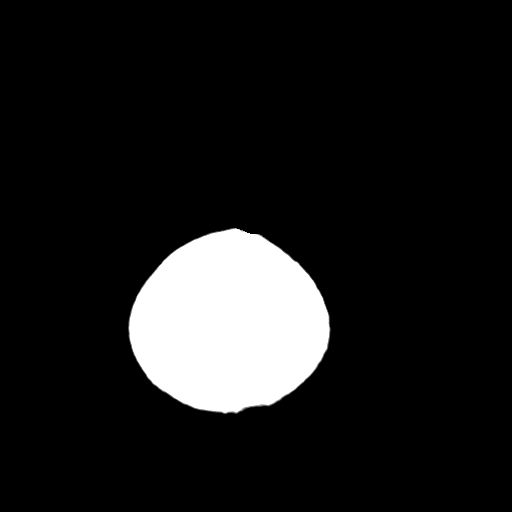
[im 32/35  brain]
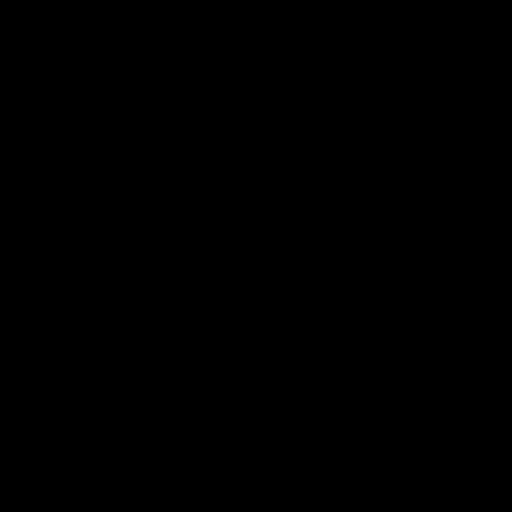
[im 32/35  bone]
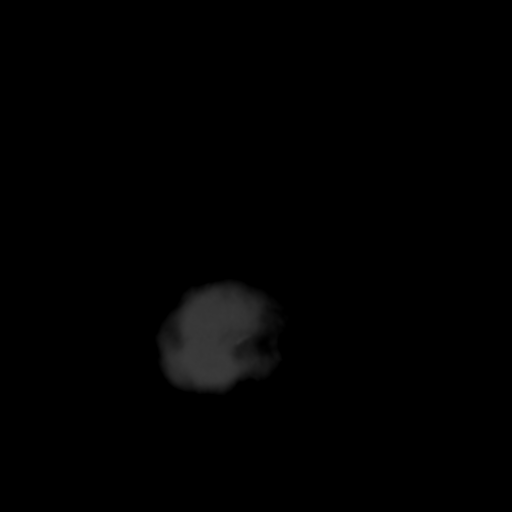

[Series 4: coronal soft tissue · coronal · 0.33mm/px · 3 of 68 slices shown]
[im 23/68  brain]
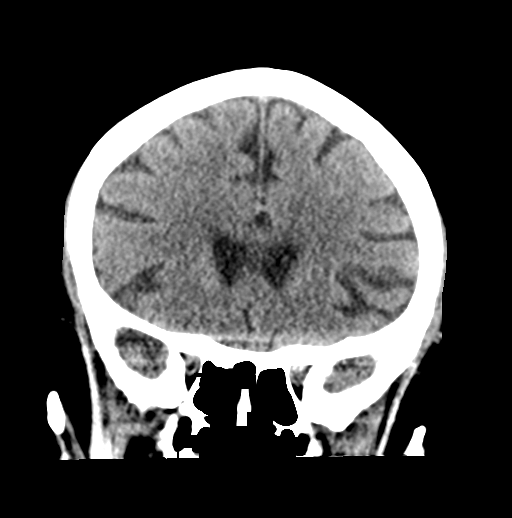
[im 30/68  brain]
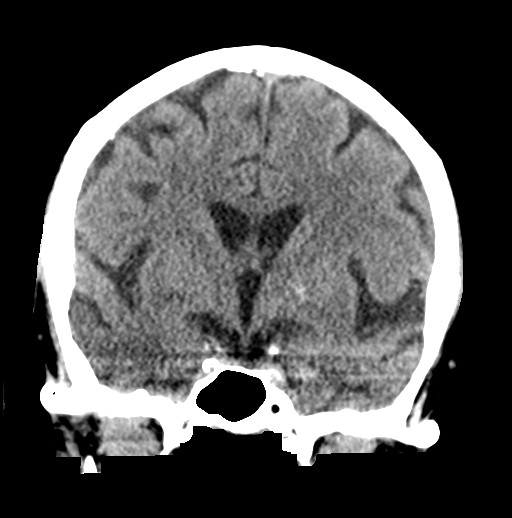
[im 38/68  brain]
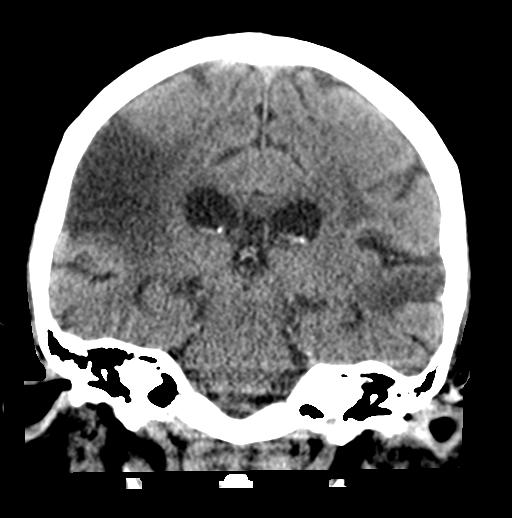

[Series 5: sagittal soft tissue · sagittal · 0.33mm/px · 3 of 56 slices shown]
[im 19/56  brain]
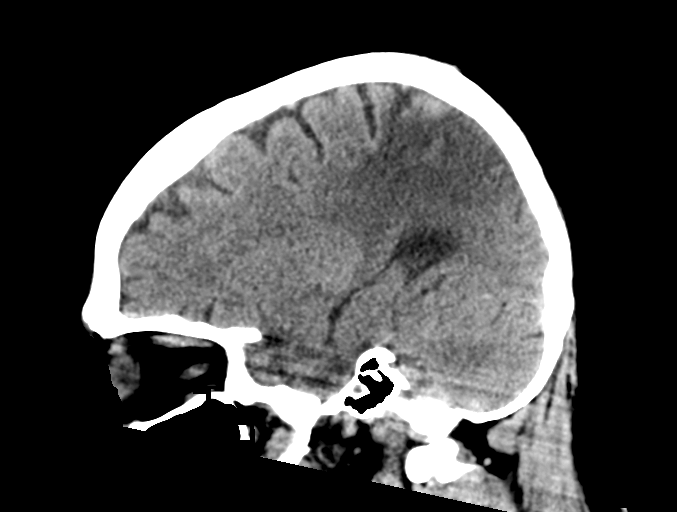
[im 28/56  brain]
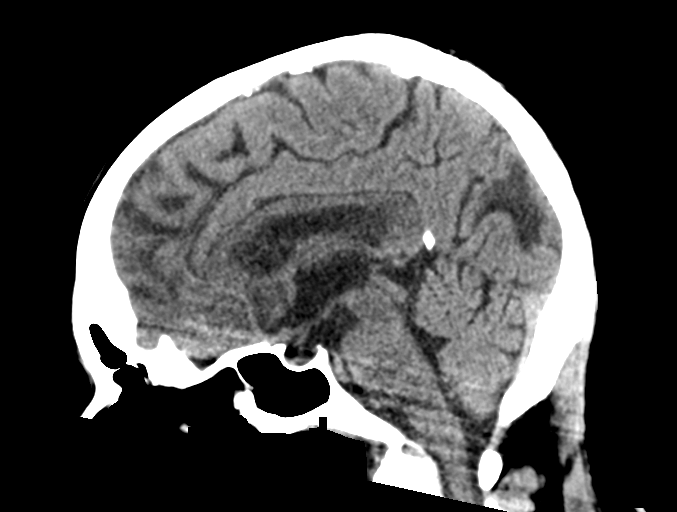
[im 37/56  brain]
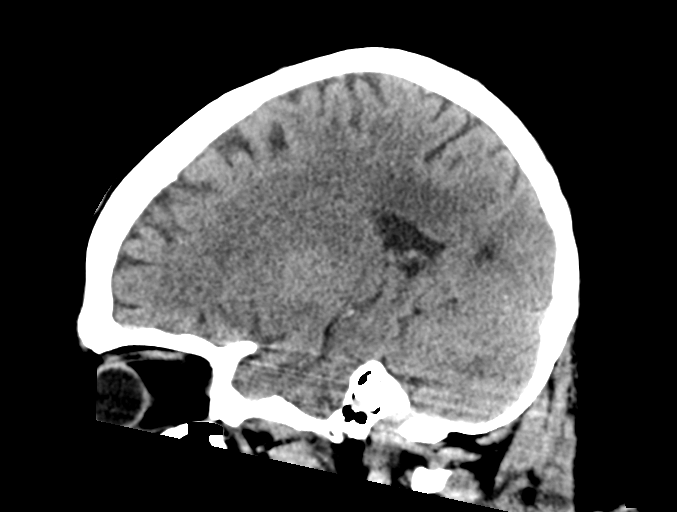

[15 of 47 positions shown; findings below may reference images not displayed]

FINDINGS: Brain: No acute CT finding. No focal abnormality affects the
brainstem or cerebellum. Old infarction at the right frontoparietal
junction affecting the cortical and subcortical brain. Old
infarction at the right parietooccipital junction. Old infarction of
the left temporal lobe and temporoparietal junction affecting the
cortical and subcortical brain. No mass, hemorrhage, hydrocephalus
or extra-axial collection.

Vascular: There is atherosclerotic calcification of the major
vessels at the base of the brain.

Skull: No skull fracture.

Sinuses/Orbits: Clear/normal

Other: None
IMPRESSION: No acute or traumatic finding. Multiple old infarctions as outlined
above.
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
## 2020-08-23 NOTE — ED Notes (Signed)
Pt transported to CT ?

## 2020-08-23 NOTE — Discharge Instructions (Addendum)
[name] was seen in the ED for evaluation after an unwitnessed fall  She had no physical complaints or signs of injury.  Imaging, EKG was done given her baseline mental status.  This did not show any acute/new abnormalities.  Monitor and return to the ED for any sudden significant changes in mental status or any other new concerns

## 2020-08-23 NOTE — ED Triage Notes (Signed)
Pt BIBA from [facility]-  Per EMS-0 Pt had unwitnessed fall appx 45 min PTA.  Pt reports hitting right side of her face.  Staff report pt is at baseline. Pt w/o complaints. Pt does not take blood thinners.

## 2020-08-23 NOTE — ED Notes (Signed)
[facility] notified of need for transport. 

## 2020-08-24 DIAGNOSIS — E1165 Type 2 diabetes mellitus with hyperglycemia: Secondary | ICD-10-CM | POA: Diagnosis not present

## 2020-08-24 DIAGNOSIS — R569 Unspecified convulsions: Secondary | ICD-10-CM | POA: Diagnosis not present

## 2020-08-24 DIAGNOSIS — R1310 Dysphagia, unspecified: Secondary | ICD-10-CM | POA: Diagnosis not present

## 2020-08-24 DIAGNOSIS — G9340 Encephalopathy, unspecified: Secondary | ICD-10-CM | POA: Diagnosis not present

## 2020-09-02 DIAGNOSIS — E785 Hyperlipidemia, unspecified: Secondary | ICD-10-CM | POA: Diagnosis not present

## 2020-09-02 DIAGNOSIS — I6783 Posterior reversible encephalopathy syndrome: Secondary | ICD-10-CM | POA: Diagnosis not present

## 2020-09-02 DIAGNOSIS — E1165 Type 2 diabetes mellitus with hyperglycemia: Secondary | ICD-10-CM | POA: Diagnosis not present

## 2020-09-02 DIAGNOSIS — J449 Chronic obstructive pulmonary disease, unspecified: Secondary | ICD-10-CM | POA: Diagnosis not present

## 2020-09-02 DIAGNOSIS — U071 COVID-19: Secondary | ICD-10-CM | POA: Diagnosis not present

## 2020-09-02 DIAGNOSIS — I10 Essential (primary) hypertension: Secondary | ICD-10-CM | POA: Diagnosis not present

## 2020-09-02 DIAGNOSIS — K219 Gastro-esophageal reflux disease without esophagitis: Secondary | ICD-10-CM | POA: Diagnosis not present

## 2020-09-02 DIAGNOSIS — M1990 Unspecified osteoarthritis, unspecified site: Secondary | ICD-10-CM | POA: Diagnosis not present

## 2020-09-02 DIAGNOSIS — Z8616 Personal history of COVID-19: Secondary | ICD-10-CM | POA: Diagnosis not present

## 2020-09-02 DIAGNOSIS — E1169 Type 2 diabetes mellitus with other specified complication: Secondary | ICD-10-CM | POA: Diagnosis not present

## 2020-09-06 DIAGNOSIS — U071 COVID-19: Secondary | ICD-10-CM | POA: Diagnosis not present

## 2020-09-06 DIAGNOSIS — J449 Chronic obstructive pulmonary disease, unspecified: Secondary | ICD-10-CM | POA: Diagnosis not present

## 2020-09-06 DIAGNOSIS — E1169 Type 2 diabetes mellitus with other specified complication: Secondary | ICD-10-CM | POA: Diagnosis not present

## 2020-09-12 DIAGNOSIS — I639 Cerebral infarction, unspecified: Secondary | ICD-10-CM | POA: Diagnosis not present

## 2020-09-12 DIAGNOSIS — J45901 Unspecified asthma with (acute) exacerbation: Secondary | ICD-10-CM | POA: Diagnosis not present

## 2020-09-12 DIAGNOSIS — E1165 Type 2 diabetes mellitus with hyperglycemia: Secondary | ICD-10-CM | POA: Diagnosis not present

## 2020-09-12 DIAGNOSIS — R1310 Dysphagia, unspecified: Secondary | ICD-10-CM | POA: Diagnosis not present

## 2020-09-12 DIAGNOSIS — R296 Repeated falls: Secondary | ICD-10-CM | POA: Diagnosis not present

## 2020-09-12 DIAGNOSIS — E785 Hyperlipidemia, unspecified: Secondary | ICD-10-CM | POA: Diagnosis not present

## 2020-09-12 DIAGNOSIS — Z8616 Personal history of COVID-19: Secondary | ICD-10-CM | POA: Diagnosis not present

## 2020-09-12 DIAGNOSIS — R569 Unspecified convulsions: Secondary | ICD-10-CM | POA: Diagnosis not present

## 2020-09-12 DIAGNOSIS — I6783 Posterior reversible encephalopathy syndrome: Secondary | ICD-10-CM | POA: Diagnosis not present

## 2020-09-12 DIAGNOSIS — G9340 Encephalopathy, unspecified: Secondary | ICD-10-CM | POA: Diagnosis not present

## 2020-09-12 DIAGNOSIS — M1990 Unspecified osteoarthritis, unspecified site: Secondary | ICD-10-CM | POA: Diagnosis not present

## 2020-09-12 DIAGNOSIS — E1110 Type 2 diabetes mellitus with ketoacidosis without coma: Secondary | ICD-10-CM | POA: Diagnosis not present

## 2020-09-12 DIAGNOSIS — I10 Essential (primary) hypertension: Secondary | ICD-10-CM | POA: Diagnosis not present

## 2020-09-12 DIAGNOSIS — K219 Gastro-esophageal reflux disease without esophagitis: Secondary | ICD-10-CM | POA: Diagnosis not present

## 2020-09-12 DIAGNOSIS — U071 COVID-19: Secondary | ICD-10-CM | POA: Diagnosis not present

## 2020-09-14 DIAGNOSIS — E1165 Type 2 diabetes mellitus with hyperglycemia: Secondary | ICD-10-CM | POA: Diagnosis not present

## 2020-09-14 DIAGNOSIS — G9340 Encephalopathy, unspecified: Secondary | ICD-10-CM | POA: Diagnosis not present

## 2020-09-14 DIAGNOSIS — U071 COVID-19: Secondary | ICD-10-CM | POA: Diagnosis not present

## 2020-09-14 DIAGNOSIS — R1310 Dysphagia, unspecified: Secondary | ICD-10-CM | POA: Diagnosis not present

## 2020-09-14 DIAGNOSIS — I639 Cerebral infarction, unspecified: Secondary | ICD-10-CM | POA: Diagnosis not present

## 2020-09-14 DIAGNOSIS — R569 Unspecified convulsions: Secondary | ICD-10-CM | POA: Diagnosis not present

## 2020-09-19 ENCOUNTER — Ambulatory Visit: Payer: Medicare Other | Admitting: Internal Medicine

## 2020-09-19 DIAGNOSIS — R262 Difficulty in walking, not elsewhere classified: Secondary | ICD-10-CM | POA: Diagnosis not present

## 2020-09-19 DIAGNOSIS — M6281 Muscle weakness (generalized): Secondary | ICD-10-CM | POA: Diagnosis not present

## 2020-09-19 DIAGNOSIS — R278 Other lack of coordination: Secondary | ICD-10-CM | POA: Diagnosis not present

## 2020-09-19 DIAGNOSIS — E1165 Type 2 diabetes mellitus with hyperglycemia: Secondary | ICD-10-CM | POA: Diagnosis not present

## 2020-09-20 DIAGNOSIS — R262 Difficulty in walking, not elsewhere classified: Secondary | ICD-10-CM | POA: Diagnosis not present

## 2020-09-20 DIAGNOSIS — R278 Other lack of coordination: Secondary | ICD-10-CM | POA: Diagnosis not present

## 2020-09-20 DIAGNOSIS — M6281 Muscle weakness (generalized): Secondary | ICD-10-CM | POA: Diagnosis not present

## 2020-09-20 DIAGNOSIS — E1165 Type 2 diabetes mellitus with hyperglycemia: Secondary | ICD-10-CM | POA: Diagnosis not present

## 2020-09-21 DIAGNOSIS — R262 Difficulty in walking, not elsewhere classified: Secondary | ICD-10-CM | POA: Diagnosis not present

## 2020-09-21 DIAGNOSIS — E1165 Type 2 diabetes mellitus with hyperglycemia: Secondary | ICD-10-CM | POA: Diagnosis not present

## 2020-09-21 DIAGNOSIS — M6281 Muscle weakness (generalized): Secondary | ICD-10-CM | POA: Diagnosis not present

## 2020-09-21 DIAGNOSIS — R278 Other lack of coordination: Secondary | ICD-10-CM | POA: Diagnosis not present

## 2020-09-23 DIAGNOSIS — R278 Other lack of coordination: Secondary | ICD-10-CM | POA: Diagnosis not present

## 2020-09-23 DIAGNOSIS — R262 Difficulty in walking, not elsewhere classified: Secondary | ICD-10-CM | POA: Diagnosis not present

## 2020-09-23 DIAGNOSIS — M6281 Muscle weakness (generalized): Secondary | ICD-10-CM | POA: Diagnosis not present

## 2020-09-23 DIAGNOSIS — E1165 Type 2 diabetes mellitus with hyperglycemia: Secondary | ICD-10-CM | POA: Diagnosis not present

## 2020-09-23 DIAGNOSIS — Z79899 Other long term (current) drug therapy: Secondary | ICD-10-CM | POA: Diagnosis not present

## 2020-09-24 DIAGNOSIS — R278 Other lack of coordination: Secondary | ICD-10-CM | POA: Diagnosis not present

## 2020-09-24 DIAGNOSIS — M6281 Muscle weakness (generalized): Secondary | ICD-10-CM | POA: Diagnosis not present

## 2020-09-24 DIAGNOSIS — R262 Difficulty in walking, not elsewhere classified: Secondary | ICD-10-CM | POA: Diagnosis not present

## 2020-09-24 DIAGNOSIS — E1165 Type 2 diabetes mellitus with hyperglycemia: Secondary | ICD-10-CM | POA: Diagnosis not present

## 2020-09-25 DIAGNOSIS — R278 Other lack of coordination: Secondary | ICD-10-CM | POA: Diagnosis not present

## 2020-09-25 DIAGNOSIS — R262 Difficulty in walking, not elsewhere classified: Secondary | ICD-10-CM | POA: Diagnosis not present

## 2020-09-25 DIAGNOSIS — M6281 Muscle weakness (generalized): Secondary | ICD-10-CM | POA: Diagnosis not present

## 2020-09-25 DIAGNOSIS — E1165 Type 2 diabetes mellitus with hyperglycemia: Secondary | ICD-10-CM | POA: Diagnosis not present

## 2020-09-26 ENCOUNTER — Encounter (HOSPITAL_COMMUNITY): Payer: Self-pay

## 2020-09-26 ENCOUNTER — Emergency Department (HOSPITAL_COMMUNITY)
Admission: EM | Admit: 2020-09-26 | Discharge: 2020-09-27 | Disposition: A | Discharge status: Home / Self Care | Payer: Medicare Other | Attending: Emergency Medicine | Admitting: Emergency Medicine

## 2020-09-26 ENCOUNTER — Other Ambulatory Visit: Payer: Self-pay

## 2020-09-26 ENCOUNTER — Emergency Department (HOSPITAL_COMMUNITY): Payer: Medicare Other

## 2020-09-26 DIAGNOSIS — J45909 Unspecified asthma, uncomplicated: Secondary | ICD-10-CM | POA: Insufficient documentation

## 2020-09-26 DIAGNOSIS — Z21 Asymptomatic human immunodeficiency virus [HIV] infection status: Secondary | ICD-10-CM | POA: Diagnosis not present

## 2020-09-26 DIAGNOSIS — Z794 Long term (current) use of insulin: Secondary | ICD-10-CM | POA: Insufficient documentation

## 2020-09-26 DIAGNOSIS — S0990XA Unspecified injury of head, initial encounter: Secondary | ICD-10-CM | POA: Insufficient documentation

## 2020-09-26 DIAGNOSIS — I10 Essential (primary) hypertension: Secondary | ICD-10-CM | POA: Insufficient documentation

## 2020-09-26 DIAGNOSIS — R410 Disorientation, unspecified: Secondary | ICD-10-CM | POA: Diagnosis not present

## 2020-09-26 DIAGNOSIS — Z79899 Other long term (current) drug therapy: Secondary | ICD-10-CM | POA: Insufficient documentation

## 2020-09-26 DIAGNOSIS — W01198A Fall on same level from slipping, tripping and stumbling with subsequent striking against other object, initial encounter: Secondary | ICD-10-CM | POA: Insufficient documentation

## 2020-09-26 DIAGNOSIS — R404 Transient alteration of awareness: Secondary | ICD-10-CM | POA: Diagnosis not present

## 2020-09-26 DIAGNOSIS — Z8673 Personal history of transient ischemic attack (TIA), and cerebral infarction without residual deficits: Secondary | ICD-10-CM | POA: Diagnosis not present

## 2020-09-26 DIAGNOSIS — R456 Violent behavior: Secondary | ICD-10-CM | POA: Diagnosis not present

## 2020-09-26 DIAGNOSIS — F17210 Nicotine dependence, cigarettes, uncomplicated: Secondary | ICD-10-CM | POA: Insufficient documentation

## 2020-09-26 DIAGNOSIS — S0081XA Abrasion of other part of head, initial encounter: Secondary | ICD-10-CM | POA: Diagnosis not present

## 2020-09-26 DIAGNOSIS — E1165 Type 2 diabetes mellitus with hyperglycemia: Secondary | ICD-10-CM | POA: Insufficient documentation

## 2020-09-26 DIAGNOSIS — U071 COVID-19: Secondary | ICD-10-CM | POA: Diagnosis not present

## 2020-09-26 DIAGNOSIS — Z743 Need for continuous supervision: Secondary | ICD-10-CM | POA: Diagnosis not present

## 2020-09-26 DIAGNOSIS — R4689 Other symptoms and signs involving appearance and behavior: Secondary | ICD-10-CM

## 2020-09-26 LAB — COMPREHENSIVE METABOLIC PANEL
ALT: 19 U/L (ref 0–44)
AST: 19 U/L (ref 15–41)
Albumin: 3.5 g/dL (ref 3.5–5.0)
Alkaline Phosphatase: 111 U/L (ref 38–126)
Anion gap: 12 (ref 5–15)
BUN: 23 mg/dL — ABNORMAL HIGH (ref 6–20)
CO2: 27 mmol/L (ref 22–32)
Calcium: 9.3 mg/dL (ref 8.9–10.3)
Chloride: 106 mmol/L (ref 98–111)
Creatinine, Ser: 0.92 mg/dL (ref 0.44–1.00)
GFR, Estimated: >60 mL/min (ref >60)
Glucose, Bld: 89 mg/dL (ref 70–99)
Potassium: 4.3 mmol/L (ref 3.5–5.1)
Sodium: 145 mmol/L (ref 135–145)
Total Bilirubin: 0.2 mg/dL — ABNORMAL LOW (ref 0.3–1.2)
Total Protein: 6.6 g/dL (ref 6.5–8.1)

## 2020-09-26 LAB — CBC WITH DIFFERENTIAL/PLATELET
Abs Immature Granulocytes: 0.01 10*3/uL (ref 0.00–0.07)
Basophils Absolute: 0 10*3/uL (ref 0.0–0.1)
Basophils Relative: 1 %
Eosinophils Absolute: 0.3 10*3/uL (ref 0.0–0.5)
Eosinophils Relative: 4 %
HCT: 35.6 % — ABNORMAL LOW (ref 36.0–46.0)
Hemoglobin: 11.7 g/dL — ABNORMAL LOW (ref 12.0–15.0)
Immature Granulocytes: 0 %
Lymphocytes Relative: 48 %
Lymphs Abs: 3.5 10*3/uL (ref 0.7–4.0)
MCH: 29.2 pg (ref 26.0–34.0)
MCHC: 32.9 g/dL (ref 30.0–36.0)
MCV: 88.8 fL (ref 80.0–100.0)
Monocytes Absolute: 0.6 10*3/uL (ref 0.1–1.0)
Monocytes Relative: 8 %
Neutro Abs: 2.8 10*3/uL (ref 1.7–7.7)
Neutrophils Relative %: 39 %
Platelets: 302 10*3/uL (ref 150–400)
RBC: 4.01 MIL/uL (ref 3.87–5.11)
RDW: 14.8 % (ref 11.5–15.5)
WBC: 7.2 10*3/uL (ref 4.0–10.5)
nRBC: 0 % (ref 0.0–0.2)

## 2020-09-26 LAB — CBG MONITORING, ED: Glucose-Capillary: 71 mg/dL (ref 70–99)

## 2020-09-26 IMAGING — CT CT HEAD W/O CM
3 series · 16 of 47 positions shown, 19 images · non-contrast
Comparison: [DATE].

CLINICAL DATA: Fell and hit her head with a small left forehead
abrasion. Altered mental status.

EXAM:
CT HEAD WITHOUT CONTRAST
TECHNIQUE: Contiguous axial images were obtained from the base of the skull
through the vertex without intravenous contrast.

[Series 2: head wo · axial · 0.40mm/px · z∈[+24,+149]mm · 10 of 30 slices shown, 13 images]
[im 3/30  brain]
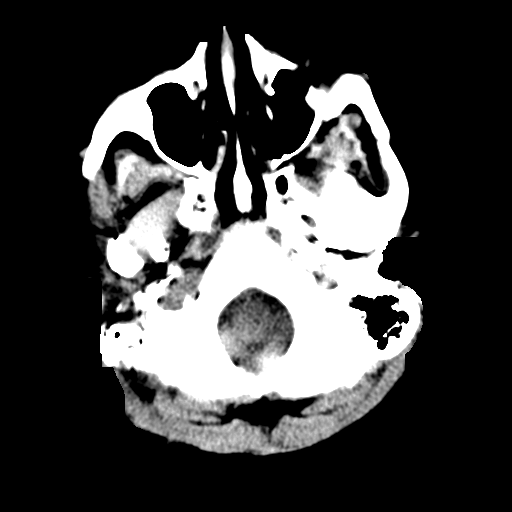
[im 3/30  bone]
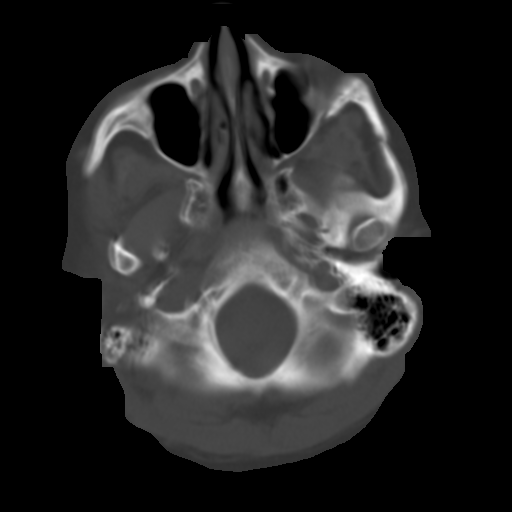
[im 6/30  brain]
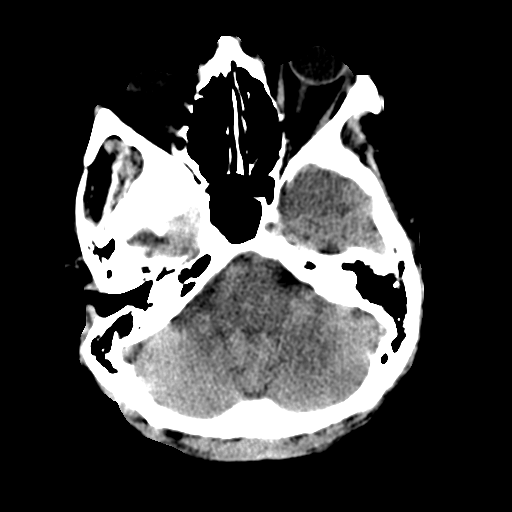
[im 9/30  brain]
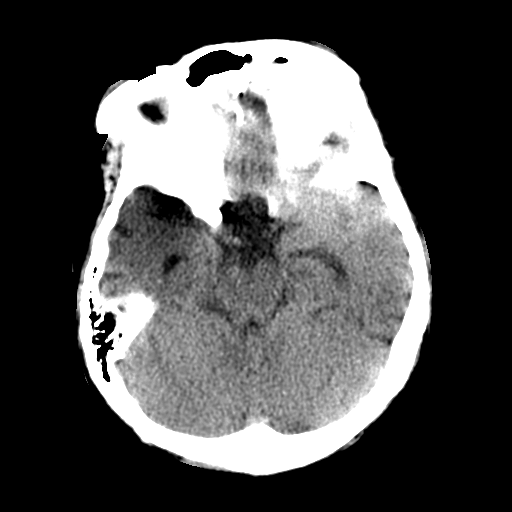
[im 11/30  brain]
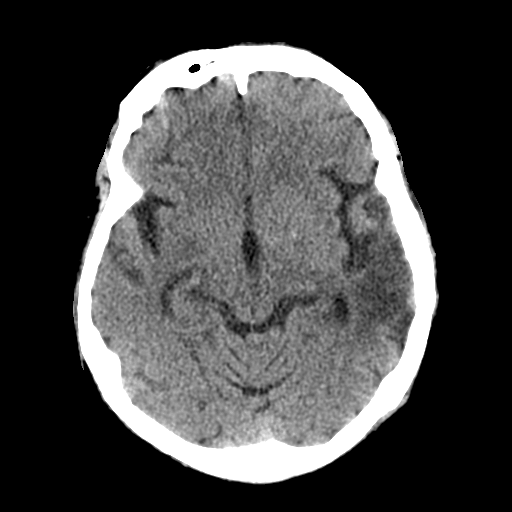
[im 14/30  brain]
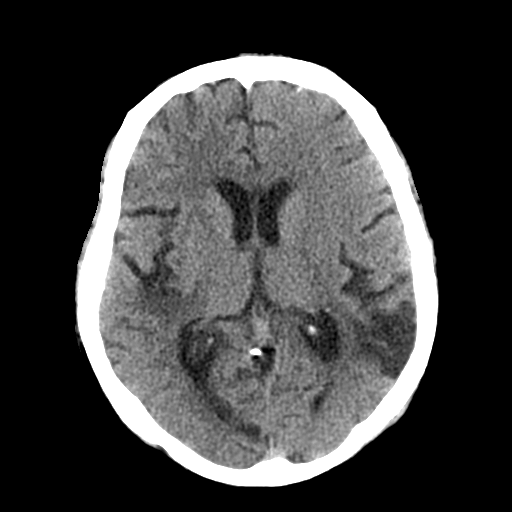
[im 14/30  bone]
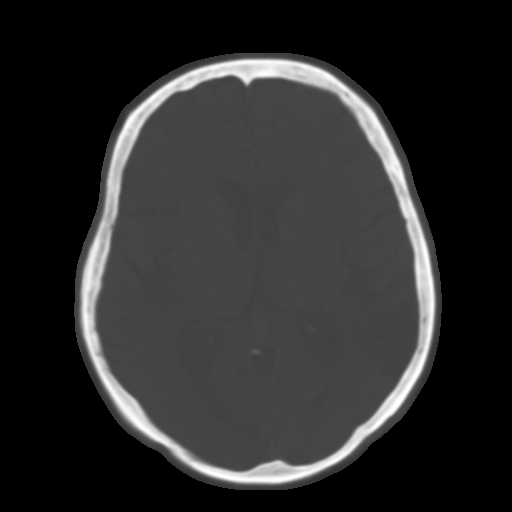
[im 17/30  brain]
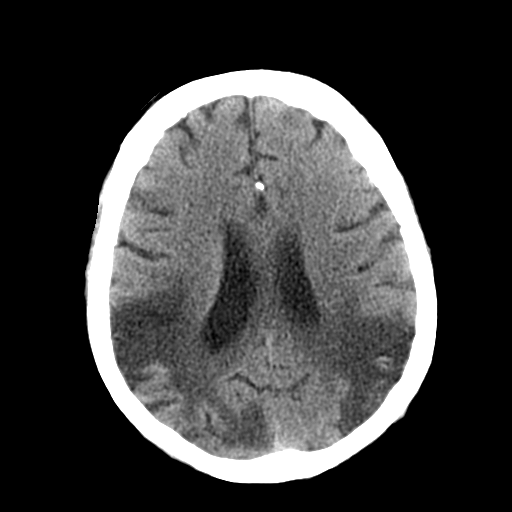
[im 20/30  brain]
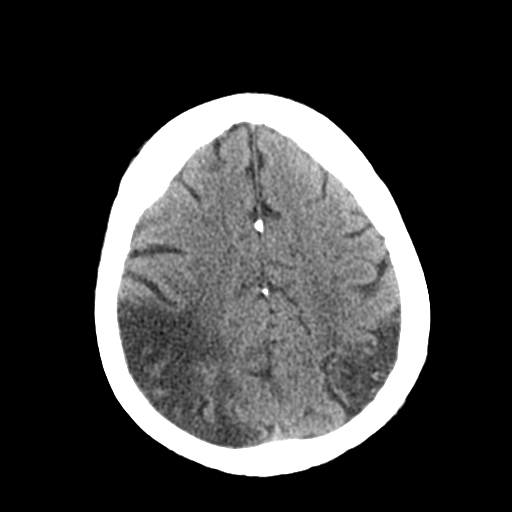
[im 23/30  brain]
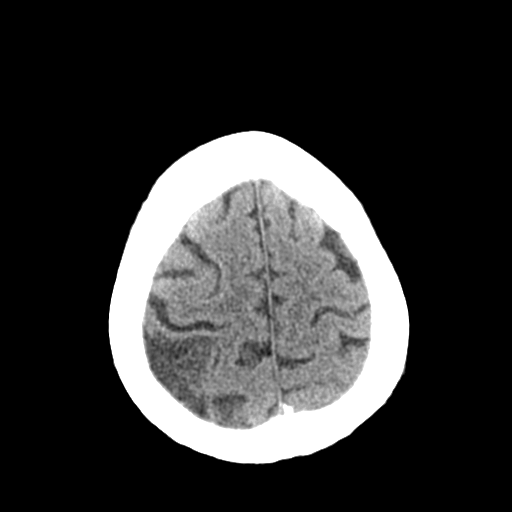
[im 25/30  brain]
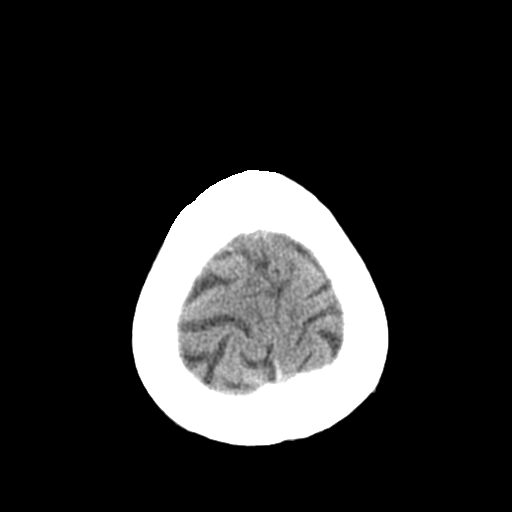
[im 25/30  bone]
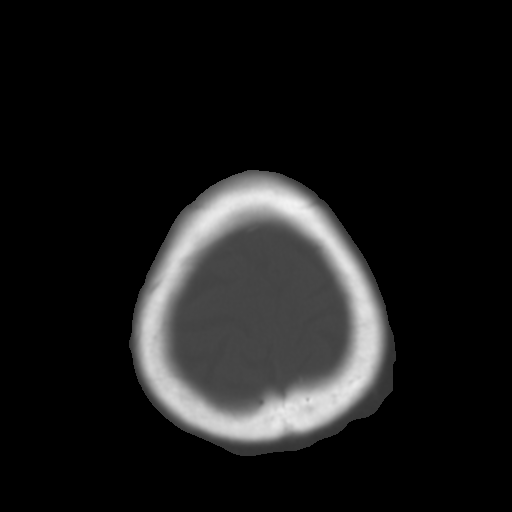
[im 28/30  brain]
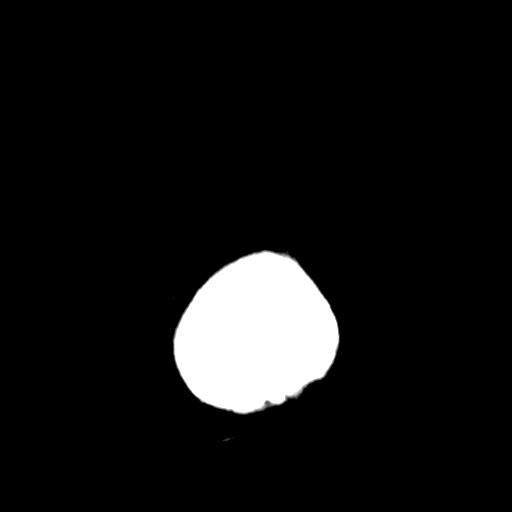

[Series 5: coronal soft tissue · coronal · 0.29mm/px · 3 of 65 slices shown]
[im 22/65  brain]
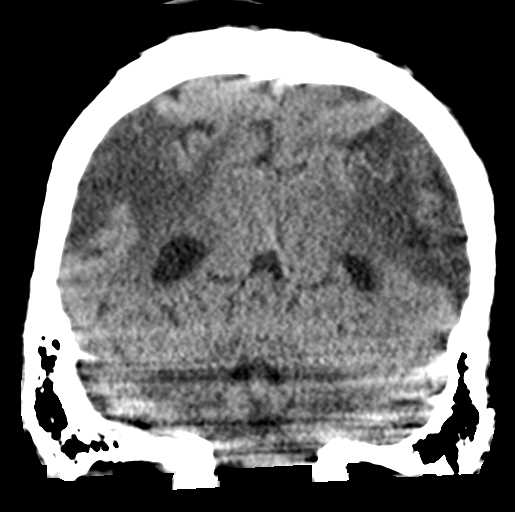
[im 29/65  brain]
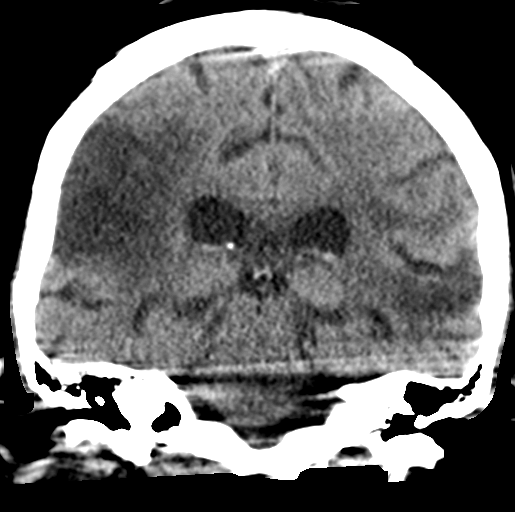
[im 36/65  brain]
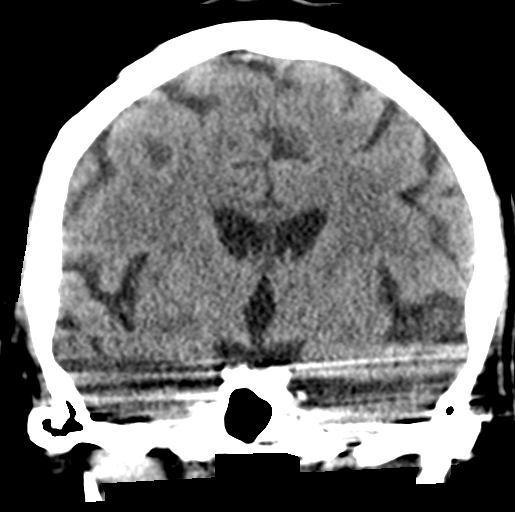

[Series 6: sagittal soft tissue · sagittal · 0.29mm/px · 3 of 51 slices shown]
[im 17/51  brain]
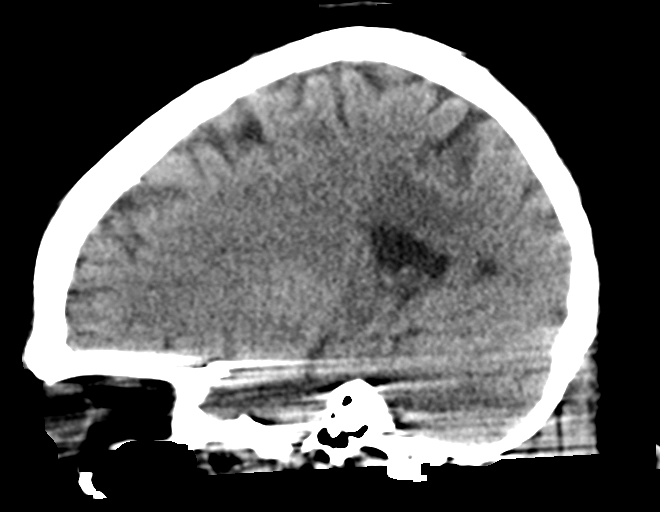
[im 26/51  brain]
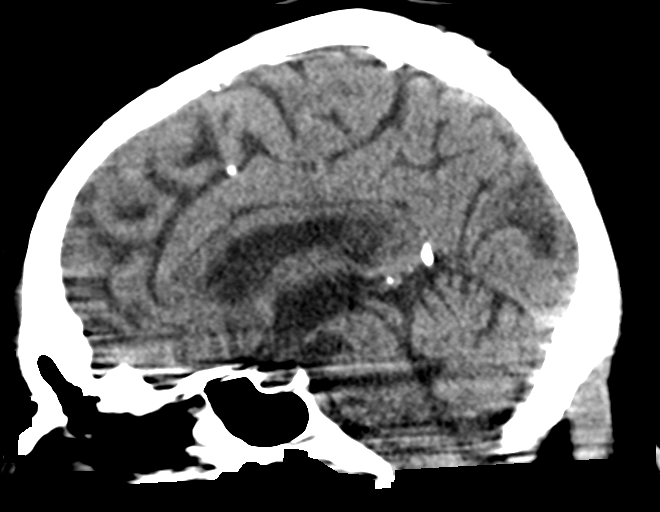
[im 34/51  brain]
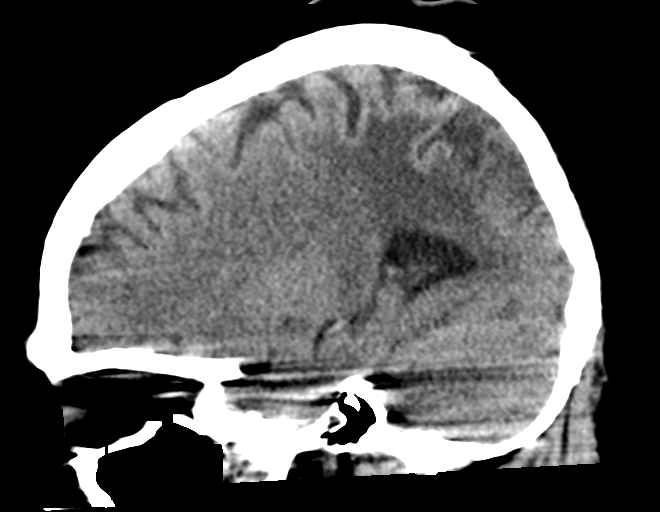

[16 of 47 positions shown; findings below may reference images not displayed]

FINDINGS: Brain: The previously described old bilateral cerebral hemisphere
infarcts are unchanged. Motion artifacts obscuring the findings at
the skull base. No visible intracranial hemorrhage, mass lesion or
CT evidence of acute infarction.

Vascular: No hyperdense vessel or unexpected calcification.

Skull: Normal. Negative for fracture or focal lesion.

Sinuses/Orbits: Unremarkable.

Other: None.
IMPRESSION: 1. No acute abnormality.
2. Stable old bilateral cerebral hemisphere infarcts.

## 2020-09-26 MED ORDER — AMLODIPINE BESYLATE 5 MG PO TABS
10.0000 mg | ORAL_TABLET | Freq: Every day | ORAL | Status: DC
  Administered 2020-09-26 – 2020-09-27 (×2): 10 mg via ORAL
  Filled 2020-09-26 (×2): qty 2

## 2020-09-26 MED ORDER — CLONIDINE HCL 0.1 MG PO TABS
0.2000 mg | ORAL_TABLET | Freq: Two times a day (BID) | ORAL | Status: DC
  Administered 2020-09-26 – 2020-09-27 (×2): 0.2 mg via ORAL
  Filled 2020-09-26 (×2): qty 2

## 2020-09-26 MED ORDER — ABACAVIR-DOLUTEGRAVIR-LAMIVUD 600-50-300 MG PO TABS
1.0000 | ORAL_TABLET | Freq: Every day | ORAL | Status: DC
  Administered 2020-09-27: 1 via ORAL
  Filled 2020-09-26 (×2): qty 1

## 2020-09-26 MED ORDER — PHENOBARBITAL 64.8 MG PO TABS: 64.8000 mg | ORAL_TABLET | Freq: Two times a day (BID) | ORAL | Status: DC

## 2020-09-26 MED ORDER — BUSPIRONE HCL 10 MG PO TABS
15.0000 mg | ORAL_TABLET | Freq: Three times a day (TID) | ORAL | Status: DC | PRN
  Administered 2020-09-26: 15 mg via ORAL
  Filled 2020-09-26: qty 2

## 2020-09-26 MED ORDER — INSULIN DETEMIR 100 UNIT/ML ~~LOC~~ SOLN
7.0000 [IU] | Freq: Two times a day (BID) | SUBCUTANEOUS | Status: DC
  Administered 2020-09-27: 7 [IU] via SUBCUTANEOUS
  Filled 2020-09-26 (×3): qty 0.07

## 2020-09-26 MED ORDER — LEVETIRACETAM 500 MG PO TABS: 1000.0000 mg | ORAL_TABLET | Freq: Two times a day (BID) | ORAL | Status: DC

## 2020-09-26 MED ORDER — SERTRALINE HCL 50 MG PO TABS
100.0000 mg | ORAL_TABLET | Freq: Every day | ORAL | Status: DC
  Administered 2020-09-26: 100 mg via ORAL
  Filled 2020-09-26: qty 2

## 2020-09-26 MED ORDER — ASENAPINE MALEATE 5 MG SL SUBL
5.0000 mg | SUBLINGUAL_TABLET | Freq: Two times a day (BID) | SUBLINGUAL | Status: DC | PRN
  Administered 2020-09-27 (×2): 5 mg via SUBLINGUAL
  Filled 2020-09-26 (×2): qty 1
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
## 2020-09-26 NOTE — ED Notes (Signed)
Dr. Almyra Free aware pt is difficult stick for labs. Staff having trouble with blood draws, Patty, Charge RN aware and coming to assist.

## 2020-09-26 NOTE — BH Assessment (Signed)
Per notes patient presents from [facility] for a psych evaluation after reporting to have fallen earlier this date and hitting her head. Patient per notes, has been combative and aggressive for the last two weeks prior to her fall this date. This writer attempted to assess patient at 1815 hours unsuccessfully. Patient is observed to be drowsy and difficult to arouse. Patient is speaking in a low voice that is inaudible and garbled as she attempts to stay awake. As the writer attempts to arouse patient continues to drift back to sleep. This Probation officer spoke with [place] MD who stated he had spoken to daughter earlier this date to gather collateral information. [place] reported that daughter who was at bedside earlier stated patient was at her baseline. This writer attempted to contact daughter Palma Buster at [phone] although she could not be reached at this time. Patient will be seen later this date when she is more alert and daughter can be reached for contributing collateral information.

## 2020-09-26 NOTE — ED Triage Notes (Signed)
Pt BIB EMS from [facility]. EMS reports pt needs pscyh eval. Pt has had behavioral episodes x 2 weeks being combative and aggressive, tried to elope facility. Facility wants psych eval. Pt had ground level fall, hit head. EMS reports small abrasion to left forehead. Facility denies LOC, fall was unwitnessed. Pt does not take blood thinners.   140/90 90 HR 16 Resp 100% RA CBG 120

## 2020-09-26 NOTE — ED Notes (Signed)
Susan [name]- pt daughter- given update at 2250. [phone]

## 2020-09-27 DIAGNOSIS — U071 COVID-19: Secondary | ICD-10-CM | POA: Diagnosis not present

## 2020-09-27 DIAGNOSIS — M2550 Pain in unspecified joint: Secondary | ICD-10-CM | POA: Diagnosis not present

## 2020-09-27 DIAGNOSIS — R456 Violent behavior: Secondary | ICD-10-CM | POA: Diagnosis not present

## 2020-09-27 DIAGNOSIS — Z743 Need for continuous supervision: Secondary | ICD-10-CM | POA: Diagnosis not present

## 2020-09-27 DIAGNOSIS — Z7401 Bed confinement status: Secondary | ICD-10-CM | POA: Diagnosis not present

## 2020-09-27 LAB — CBG MONITORING, ED
Glucose-Capillary: 93 mg/dL (ref 70–99)
Glucose-Capillary: 103 mg/dL — ABNORMAL HIGH (ref 70–99)
Glucose-Capillary: 86 mg/dL (ref 70–99)

## 2020-09-27 LAB — RAPID URINE DRUG SCREEN, HOSP PERFORMED
Amphetamines: NOT DETECTED
Barbiturates: POSITIVE — AB
Benzodiazepines: POSITIVE — AB
Cocaine: NOT DETECTED
Opiates: NOT DETECTED
Tetrahydrocannabinol: NOT DETECTED

## 2020-09-27 LAB — SARS CORONAVIRUS 2 (TAT 6-24 HRS): SARS Coronavirus 2: POSITIVE — AB

## 2020-09-27 MED ORDER — SERTRALINE HCL 50 MG PO TABS: 50.0000 mg | ORAL_TABLET | Freq: Every day | ORAL | Status: DC

## 2020-09-27 MED ORDER — QUETIAPINE FUMARATE 50 MG PO TABS
50.0000 mg | ORAL_TABLET | Freq: Two times a day (BID) | ORAL | Status: DC
  Administered 2020-09-27: 50 mg via ORAL
  Filled 2020-09-27: qty 1

## 2020-09-27 MED ORDER — LORAZEPAM 2 MG/ML IJ SOLN
1.0000 mg | Freq: Once | INTRAMUSCULAR | Status: AC
  Administered 2020-09-27: 1 mg via INTRAMUSCULAR
  Filled 2020-09-27: qty 1

## 2020-09-27 NOTE — Consult Note (Signed)
  Medication Recommendation Patient location:  [facility] ED Provider location:  [place], 60 y.o., female presented to [facility] ED via EMS from [place] facility with complaints of an earlier fall hitting her head and prior to fall patient was exhibiting combative and aggressive behavior for the last two weeks.  Patients record and medications reviewed and consulted with Dr. Serafina Mitchell on 09/27/20.    Current patient has been resting and no report of aggressive or behavioral outburst other than when patient first came into ED.    Patient also has history of  Schizoaffective disorder bipolar type and Dementia with behavioral disturbance   Current Outpatient  Psychotropic Medications Buspar 15 mg Tid Zoloft 100 mg daily Saphris 5 mg Bid  Prn agitation/psychosis Seroquel 100 mg Tid Depakote 1000 mg Bid Zyprexa 5 mg Bid Ativan 1 mg Q 6 hours prn anxiety Ativan 0.5 mg Q 6 hr prn anxiety Melatonin 3 mg Q hs Clonidine 0.2 mg bid  Labs reviewed:  Last noted Valproic acid 08/16/20:  41 below therapeutic level  Current Facility-Administered Medications Patient home medications were restarted  Medication Recommendation  Decrease Zoloft to 50 mg daily.  Consider discontinue Saphris, and Decrease Ativan.    Recommendation:  Patient on multiple antipsychotics.  Recommend patient to follow up with provider that is prescribing medications to make changes.  Multiple mediations is not always better.  Patient may do better if some of the medications are stopped or decreased in dose.  Patient may have been on medication for a while and developed a tolerance.     Also recommend repeat in Valproic Acid level; last checked 08/16/20  Disposition:   Patient doesn't need psychiatric hospitalization; Patient does need to follow up with her current provider for medication management.   Patient does not meet criteria for psychiatric inpatient admission.  Spoke with Dr. Ronnald Nian ; informed of above  recommendation.  Earleen Newport, NP
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
## 2020-09-27 NOTE — ED Notes (Signed)
Report given to Ren[place]-PTAR called for transport back to facilty

## 2020-09-27 NOTE — ED Notes (Signed)
PTARs call to transport the patient to [facility].

## 2020-09-27 NOTE — ED Notes (Signed)
Purewick placed on patient. Seizure pads placed on patients side rails due to patient continuously trying to get out of bed and putting her legs through. Pt was verbally abusive to staff while getting changed. Pt now resting comfortably.

## 2020-09-27 NOTE — ED Notes (Signed)
PTAR called for update. PTAR stated pt was on the list but it was "going to be awhile."

## 2020-09-27 NOTE — ED Provider Notes (Signed)
Patient cleared by psychiatry.  Patient incidentally Covid positive but no signs of respiratory distress.  No hypoxia.   Lennice Sites, DO 09/27/20 1224

## 2020-09-27 NOTE — ED Notes (Signed)
Per social worker Ricquita to call [facility] and give report to [place]. Attempted to call report to [facility] and they said they are not going to take her back because she try to get out of the window. Staff name Anderson Malta wanted to transfer me to DOE to talk to her but I ask to transfer her to care manage.

## 2020-09-27 NOTE — Discharge Instructions (Addendum)
Follow-up with primary care doctor/psychiatry for any further medication adjustments.

## 2020-09-27 NOTE — Progress Notes (Signed)
TOC CM/CSW has contacted pts RN Kandice Moos, [place]) in regards to call report to [facility] and pts transport back to [facility].   CSW will continue to follow for dc needs.  Azarian Starace Tarpley-Carter, MSW, LCSW-A Pronouns:  She, Her, Hers                  [place] ED Transitions of CareClinical Social Worker [email or web address] [phone]

## 2020-09-27 NOTE — ED Notes (Signed)
Pt screaming obscenities at staff and asking if she is on an airplane.

## 2020-09-27 NOTE — Progress Notes (Signed)
Once pt has been psychiatrically cleared pt can return to [facility].  Please reconsult when pt has been psychiatrically cleared for dc, CSW signing off.  Wilmer Berryhill Tarpley-Carter, MSW, LCSW-A Pronouns:  She, Her, [place] ED Transitions of CareClinical Social Worker [email or web address] [phone]

## 2020-09-27 NOTE — BH Assessment (Signed)
[place] Assessment Progress Note  Per Shuvon Rankin, NP, this voluntary pt does not require psychiatric hospitalization at this time.  Pt is psychiatrically cleared.  Discharge instructions advise pt to follow up with her regular outpatient provider(s).  EDP Lennice Sites, DO and pt's nurse, Kandice Moos, have been notified.  Jalene Mullet, [place] Triage Specialist [phone]

## 2020-09-27 NOTE — Progress Notes (Signed)
TOC CM spoke to [place]. Will email AVS and Psych Note to [facility] per [facility] request. Pt to be transported back to facility via [facility]. [place], [place] ED TOC CM [phone]

## 2020-09-27 NOTE — Progress Notes (Signed)
TOC CM/CSW spoke with Susan [name]/[facility] [phone].  Per Susan [name] they are accepting pt back, pt will arrive within the hour by PTAR.  CSW will continue to follow for dc needs.  Trenten Watchman Tarpley-Carter, MSW, LCSW-A Pronouns:  She, Her, Hers                  [place] ED Transitions of CareClinical Social Worker [email or web address] [phone]
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
## 2020-09-27 NOTE — Progress Notes (Signed)
..   Transition of Care [facility]) - Emergency Department Mini Assessment   Patient Details  Name: Susan [name] MRN: [id number] Date of Birth: Sep 30, 1960  Transition of [facility]) CM/SW Contact:    Cherril Hett C Tarpley-Carter, Kramer Phone Number: 09/27/2020, 9:38 AM   Clinical Narrative: TOC CM/CSW contacted Mendel Corning to coordinate pts return. Pt states she is ready to return to [facility].  Simone Tuckey Tarpley-Carter, MSW, LCSW-A Pronouns:  She, Her, Hers                  [name] ED Transitions of CareClinical Social Worker [email or web address] [phone]   ED Mini Assessment: What brought you to the Emergency Department? : Psych Eval from [facility]  Barriers to Discharge: No Barriers Identified     Means of departure: Ambulance  Interventions which prevented an admission or readmission: Other (must enter comment) (Coordinating return to SNF)    Patient Contact and Communications Key Contact 1: Gabriel Cirri   Spoke with: Maury Dus Date: 09/27/20,     Contact Phone Number: [phone] Call outcome: Pt can return to [facility]  Patient states their goals for this hospitalization and ongoing recovery are:: To return to [facility].gov Compare Post Acute Care list provided to:: Patient Choice offered to / list presented to : Patient  Admission diagnosis:  Fall with Pysch Evaluation  Patient Active Problem List   Diagnosis Date Noted  . PRES (posterior reversible encephalopathy syndrome) 07/31/2020  . Palliative care encounter   . Metabolic encephalopathy 21/19/4174  . Altered mental status 07/20/2020  . Encephalopathy 04/02/2020  . Acute metabolic encephalopathy 04/16/4817  . Muscle spasm 04/01/2020  . Acute encephalopathy 03/08/2020  . Palliative care by specialist   . Adult failure to thrive   . Hx of completed stroke 11/24/2019  . Acute kidney injury (AKI) with acute tubular necrosis (ATN) (HCC)   . Aortic  valve endocarditis 11/14/2019  . Recurrent falls 10/15/2019  . Constipation 02/26/2019  . Schizophrenia ([place]) 02/18/2019  . AKI (acute kidney injury) ([place]) 02/17/2019  . Chronic respiratory failure ([place]) 02/16/2019  . Right hip pain 02/21/2018  . DNR (do not resuscitate) discussion 08/23/2017  . Weakness generalized 06/24/2017  . Osteoarthritis 07/14/2016  . Abnormal hemoglobin ([place]) 04/18/2016  . Hot flashes 10/16/2015  . Human immunodeficiency virus (HIV) disease ([place]) 10/05/2015  . PTSD (post-traumatic stress disorder) 05/12/2015  . MDD (major depressive disorder), recurrent episode, moderate ([place]) 05/11/2015  . Cannabis use disorder, severe, dependence ([place]) 05/11/2015  . Tobacco use disorder 05/11/2015  . Uncontrolled type 2 diabetes mellitus with hyperglycemia, with long-term current use of insulin ([place]) 04/18/2015  . Diabetes mellitus type 2 without retinopathy ([place]) 01/28/2015  . Hereditary and idiopathic peripheral neuropathy 06/03/2013  . Dysfunctional uterine bleeding 06/04/2012  . Dyslipidemia   . GERD (gastroesophageal reflux disease)   . Schizoaffective disorder, bipolar type ([place]) 04/26/2011  . Morbid obesity ([place]) 04/26/2011  . HIV disease ([place]) 03/28/2011  . Essential hypertension 03/28/2011   PCP:  Hoyt Koch, MD Pharmacy:   [facility] [place], [place] [address] [place] [address] Phone: [phone] Fax: [phone]

## 2020-09-28 ENCOUNTER — Telehealth (HOSPITAL_COMMUNITY): Payer: Self-pay | Admitting: Adult Health

## 2020-09-28 DIAGNOSIS — G9340 Encephalopathy, unspecified: Secondary | ICD-10-CM | POA: Diagnosis not present

## 2020-09-28 DIAGNOSIS — I639 Cerebral infarction, unspecified: Secondary | ICD-10-CM | POA: Diagnosis not present

## 2020-09-28 DIAGNOSIS — U071 COVID-19: Secondary | ICD-10-CM | POA: Diagnosis not present

## 2020-09-28 DIAGNOSIS — R278 Other lack of coordination: Secondary | ICD-10-CM | POA: Diagnosis not present

## 2020-09-28 DIAGNOSIS — R569 Unspecified convulsions: Secondary | ICD-10-CM | POA: Diagnosis not present

## 2020-09-28 DIAGNOSIS — R1310 Dysphagia, unspecified: Secondary | ICD-10-CM | POA: Diagnosis not present

## 2020-09-28 DIAGNOSIS — Z79899 Other long term (current) drug therapy: Secondary | ICD-10-CM | POA: Diagnosis not present

## 2020-09-28 DIAGNOSIS — R2689 Other abnormalities of gait and mobility: Secondary | ICD-10-CM | POA: Diagnosis not present

## 2020-09-28 DIAGNOSIS — E1165 Type 2 diabetes mellitus with hyperglycemia: Secondary | ICD-10-CM | POA: Diagnosis not present

## 2020-09-28 NOTE — Telephone Encounter (Signed)
Called to discuss with patient about COVID-19 symptoms and the use of one of the available treatments for those with mild to moderate Covid symptoms and at a high risk of hospitalization.  Pt appears to qualify for outpatient treatment due to co-morbid conditions and/or a member of an at-risk group in accordance with the FDA Emergency Use Authorization.      Unable to reach pt - Phone number listed not in service.  Scot Dock

## 2020-09-29 DIAGNOSIS — E1165 Type 2 diabetes mellitus with hyperglycemia: Secondary | ICD-10-CM | POA: Diagnosis not present

## 2020-09-29 DIAGNOSIS — R278 Other lack of coordination: Secondary | ICD-10-CM | POA: Diagnosis not present

## 2020-09-29 DIAGNOSIS — R2689 Other abnormalities of gait and mobility: Secondary | ICD-10-CM | POA: Diagnosis not present

## 2020-09-30 DIAGNOSIS — R278 Other lack of coordination: Secondary | ICD-10-CM | POA: Diagnosis not present

## 2020-09-30 DIAGNOSIS — E1165 Type 2 diabetes mellitus with hyperglycemia: Secondary | ICD-10-CM | POA: Diagnosis not present

## 2020-09-30 DIAGNOSIS — R2689 Other abnormalities of gait and mobility: Secondary | ICD-10-CM | POA: Diagnosis not present

## 2020-10-01 DIAGNOSIS — E1165 Type 2 diabetes mellitus with hyperglycemia: Secondary | ICD-10-CM | POA: Diagnosis not present

## 2020-10-01 DIAGNOSIS — R2689 Other abnormalities of gait and mobility: Secondary | ICD-10-CM | POA: Diagnosis not present

## 2020-10-01 DIAGNOSIS — R278 Other lack of coordination: Secondary | ICD-10-CM | POA: Diagnosis not present

## 2020-10-03 DIAGNOSIS — R2689 Other abnormalities of gait and mobility: Secondary | ICD-10-CM | POA: Diagnosis not present

## 2020-10-03 DIAGNOSIS — R278 Other lack of coordination: Secondary | ICD-10-CM | POA: Diagnosis not present

## 2020-10-03 DIAGNOSIS — E1165 Type 2 diabetes mellitus with hyperglycemia: Secondary | ICD-10-CM | POA: Diagnosis not present

## 2020-10-04 DIAGNOSIS — Z8616 Personal history of COVID-19: Secondary | ICD-10-CM | POA: Diagnosis not present

## 2020-10-04 DIAGNOSIS — R278 Other lack of coordination: Secondary | ICD-10-CM | POA: Diagnosis not present

## 2020-10-04 DIAGNOSIS — M6281 Muscle weakness (generalized): Secondary | ICD-10-CM | POA: Diagnosis not present

## 2020-10-04 DIAGNOSIS — R2689 Other abnormalities of gait and mobility: Secondary | ICD-10-CM | POA: Diagnosis not present

## 2020-10-04 DIAGNOSIS — E1165 Type 2 diabetes mellitus with hyperglycemia: Secondary | ICD-10-CM | POA: Diagnosis not present

## 2020-10-05 DIAGNOSIS — R278 Other lack of coordination: Secondary | ICD-10-CM | POA: Diagnosis not present

## 2020-10-05 DIAGNOSIS — M6281 Muscle weakness (generalized): Secondary | ICD-10-CM | POA: Diagnosis not present

## 2020-10-05 DIAGNOSIS — E1165 Type 2 diabetes mellitus with hyperglycemia: Secondary | ICD-10-CM | POA: Diagnosis not present

## 2020-10-05 DIAGNOSIS — Z8616 Personal history of COVID-19: Secondary | ICD-10-CM | POA: Diagnosis not present

## 2020-10-05 DIAGNOSIS — R2689 Other abnormalities of gait and mobility: Secondary | ICD-10-CM | POA: Diagnosis not present

## 2020-10-06 DIAGNOSIS — R2689 Other abnormalities of gait and mobility: Secondary | ICD-10-CM | POA: Diagnosis not present

## 2020-10-06 DIAGNOSIS — Z8616 Personal history of COVID-19: Secondary | ICD-10-CM | POA: Diagnosis not present

## 2020-10-06 DIAGNOSIS — M6281 Muscle weakness (generalized): Secondary | ICD-10-CM | POA: Diagnosis not present

## 2020-10-06 DIAGNOSIS — E1165 Type 2 diabetes mellitus with hyperglycemia: Secondary | ICD-10-CM | POA: Diagnosis not present

## 2020-10-06 DIAGNOSIS — R278 Other lack of coordination: Secondary | ICD-10-CM | POA: Diagnosis not present

## 2020-10-07 DIAGNOSIS — R2689 Other abnormalities of gait and mobility: Secondary | ICD-10-CM | POA: Diagnosis not present

## 2020-10-07 DIAGNOSIS — E1165 Type 2 diabetes mellitus with hyperglycemia: Secondary | ICD-10-CM | POA: Diagnosis not present

## 2020-10-07 DIAGNOSIS — M6281 Muscle weakness (generalized): Secondary | ICD-10-CM | POA: Diagnosis not present

## 2020-10-07 DIAGNOSIS — N390 Urinary tract infection, site not specified: Secondary | ICD-10-CM | POA: Diagnosis not present

## 2020-10-07 DIAGNOSIS — Z8616 Personal history of COVID-19: Secondary | ICD-10-CM | POA: Diagnosis not present

## 2020-10-07 DIAGNOSIS — R278 Other lack of coordination: Secondary | ICD-10-CM | POA: Diagnosis not present

## 2020-10-08 DIAGNOSIS — R278 Other lack of coordination: Secondary | ICD-10-CM | POA: Diagnosis not present

## 2020-10-08 DIAGNOSIS — M6281 Muscle weakness (generalized): Secondary | ICD-10-CM | POA: Diagnosis not present

## 2020-10-08 DIAGNOSIS — E1165 Type 2 diabetes mellitus with hyperglycemia: Secondary | ICD-10-CM | POA: Diagnosis not present

## 2020-10-08 DIAGNOSIS — Z8616 Personal history of COVID-19: Secondary | ICD-10-CM | POA: Diagnosis not present

## 2020-10-08 DIAGNOSIS — R2689 Other abnormalities of gait and mobility: Secondary | ICD-10-CM | POA: Diagnosis not present

## 2020-10-10 DIAGNOSIS — R2689 Other abnormalities of gait and mobility: Secondary | ICD-10-CM | POA: Diagnosis not present

## 2020-10-10 DIAGNOSIS — Z8616 Personal history of COVID-19: Secondary | ICD-10-CM | POA: Diagnosis not present

## 2020-10-10 DIAGNOSIS — M6281 Muscle weakness (generalized): Secondary | ICD-10-CM | POA: Diagnosis not present

## 2020-10-10 DIAGNOSIS — E1165 Type 2 diabetes mellitus with hyperglycemia: Secondary | ICD-10-CM | POA: Diagnosis not present

## 2020-10-10 DIAGNOSIS — R278 Other lack of coordination: Secondary | ICD-10-CM | POA: Diagnosis not present

## 2020-10-11 DIAGNOSIS — Z8616 Personal history of COVID-19: Secondary | ICD-10-CM | POA: Diagnosis not present

## 2020-10-11 DIAGNOSIS — M6281 Muscle weakness (generalized): Secondary | ICD-10-CM | POA: Diagnosis not present

## 2020-10-11 DIAGNOSIS — R278 Other lack of coordination: Secondary | ICD-10-CM | POA: Diagnosis not present

## 2020-10-11 DIAGNOSIS — R2689 Other abnormalities of gait and mobility: Secondary | ICD-10-CM | POA: Diagnosis not present

## 2020-10-11 DIAGNOSIS — E1165 Type 2 diabetes mellitus with hyperglycemia: Secondary | ICD-10-CM | POA: Diagnosis not present

## 2020-10-12 DIAGNOSIS — R2689 Other abnormalities of gait and mobility: Secondary | ICD-10-CM | POA: Diagnosis not present

## 2020-10-12 DIAGNOSIS — R278 Other lack of coordination: Secondary | ICD-10-CM | POA: Diagnosis not present

## 2020-10-12 DIAGNOSIS — Z8616 Personal history of COVID-19: Secondary | ICD-10-CM | POA: Diagnosis not present

## 2020-10-12 DIAGNOSIS — E1165 Type 2 diabetes mellitus with hyperglycemia: Secondary | ICD-10-CM | POA: Diagnosis not present

## 2020-10-12 DIAGNOSIS — M6281 Muscle weakness (generalized): Secondary | ICD-10-CM | POA: Diagnosis not present

## 2020-10-13 DIAGNOSIS — E1165 Type 2 diabetes mellitus with hyperglycemia: Secondary | ICD-10-CM | POA: Diagnosis not present

## 2020-10-13 DIAGNOSIS — R2689 Other abnormalities of gait and mobility: Secondary | ICD-10-CM | POA: Diagnosis not present

## 2020-10-13 DIAGNOSIS — M6281 Muscle weakness (generalized): Secondary | ICD-10-CM | POA: Diagnosis not present

## 2020-10-13 DIAGNOSIS — Z8616 Personal history of COVID-19: Secondary | ICD-10-CM | POA: Diagnosis not present

## 2020-10-13 DIAGNOSIS — R278 Other lack of coordination: Secondary | ICD-10-CM | POA: Diagnosis not present

## 2020-10-14 DIAGNOSIS — E1165 Type 2 diabetes mellitus with hyperglycemia: Secondary | ICD-10-CM | POA: Diagnosis not present

## 2020-10-14 DIAGNOSIS — R2689 Other abnormalities of gait and mobility: Secondary | ICD-10-CM | POA: Diagnosis not present

## 2020-10-14 DIAGNOSIS — R278 Other lack of coordination: Secondary | ICD-10-CM | POA: Diagnosis not present

## 2020-10-14 DIAGNOSIS — Z8616 Personal history of COVID-19: Secondary | ICD-10-CM | POA: Diagnosis not present

## 2020-10-14 DIAGNOSIS — M6281 Muscle weakness (generalized): Secondary | ICD-10-CM | POA: Diagnosis not present

## 2020-10-17 DIAGNOSIS — E1165 Type 2 diabetes mellitus with hyperglycemia: Secondary | ICD-10-CM | POA: Diagnosis not present

## 2020-10-17 DIAGNOSIS — Z8616 Personal history of COVID-19: Secondary | ICD-10-CM | POA: Diagnosis not present

## 2020-10-17 DIAGNOSIS — J45901 Unspecified asthma with (acute) exacerbation: Secondary | ICD-10-CM | POA: Diagnosis not present

## 2020-10-17 DIAGNOSIS — R278 Other lack of coordination: Secondary | ICD-10-CM | POA: Diagnosis not present

## 2020-10-17 DIAGNOSIS — R296 Repeated falls: Secondary | ICD-10-CM | POA: Diagnosis not present

## 2020-10-17 DIAGNOSIS — M6281 Muscle weakness (generalized): Secondary | ICD-10-CM | POA: Diagnosis not present

## 2020-10-17 DIAGNOSIS — E1110 Type 2 diabetes mellitus with ketoacidosis without coma: Secondary | ICD-10-CM | POA: Diagnosis not present

## 2020-10-17 DIAGNOSIS — R2689 Other abnormalities of gait and mobility: Secondary | ICD-10-CM | POA: Diagnosis not present

## 2020-10-18 DIAGNOSIS — Z8616 Personal history of COVID-19: Secondary | ICD-10-CM | POA: Diagnosis not present

## 2020-10-18 DIAGNOSIS — R2689 Other abnormalities of gait and mobility: Secondary | ICD-10-CM | POA: Diagnosis not present

## 2020-10-18 DIAGNOSIS — E1165 Type 2 diabetes mellitus with hyperglycemia: Secondary | ICD-10-CM | POA: Diagnosis not present

## 2020-10-18 DIAGNOSIS — R278 Other lack of coordination: Secondary | ICD-10-CM | POA: Diagnosis not present

## 2020-10-18 DIAGNOSIS — M6281 Muscle weakness (generalized): Secondary | ICD-10-CM | POA: Diagnosis not present

## 2020-10-19 DIAGNOSIS — R278 Other lack of coordination: Secondary | ICD-10-CM | POA: Diagnosis not present

## 2020-10-19 DIAGNOSIS — M6281 Muscle weakness (generalized): Secondary | ICD-10-CM | POA: Diagnosis not present

## 2020-10-19 DIAGNOSIS — R2689 Other abnormalities of gait and mobility: Secondary | ICD-10-CM | POA: Diagnosis not present

## 2020-10-19 DIAGNOSIS — Z8616 Personal history of COVID-19: Secondary | ICD-10-CM | POA: Diagnosis not present

## 2020-10-19 DIAGNOSIS — E1165 Type 2 diabetes mellitus with hyperglycemia: Secondary | ICD-10-CM | POA: Diagnosis not present

## 2020-10-20 DIAGNOSIS — M6281 Muscle weakness (generalized): Secondary | ICD-10-CM | POA: Diagnosis not present

## 2020-10-20 DIAGNOSIS — R278 Other lack of coordination: Secondary | ICD-10-CM | POA: Diagnosis not present

## 2020-10-20 DIAGNOSIS — R2689 Other abnormalities of gait and mobility: Secondary | ICD-10-CM | POA: Diagnosis not present

## 2020-10-20 DIAGNOSIS — Z8616 Personal history of COVID-19: Secondary | ICD-10-CM | POA: Diagnosis not present

## 2020-10-20 DIAGNOSIS — E1165 Type 2 diabetes mellitus with hyperglycemia: Secondary | ICD-10-CM | POA: Diagnosis not present

## 2020-10-21 DIAGNOSIS — R278 Other lack of coordination: Secondary | ICD-10-CM | POA: Diagnosis not present

## 2020-10-21 DIAGNOSIS — E1165 Type 2 diabetes mellitus with hyperglycemia: Secondary | ICD-10-CM | POA: Diagnosis not present

## 2020-10-21 DIAGNOSIS — Z8616 Personal history of COVID-19: Secondary | ICD-10-CM | POA: Diagnosis not present

## 2020-10-21 DIAGNOSIS — R2689 Other abnormalities of gait and mobility: Secondary | ICD-10-CM | POA: Diagnosis not present

## 2020-10-21 DIAGNOSIS — M6281 Muscle weakness (generalized): Secondary | ICD-10-CM | POA: Diagnosis not present

## 2020-10-24 DIAGNOSIS — R278 Other lack of coordination: Secondary | ICD-10-CM | POA: Diagnosis not present

## 2020-10-24 DIAGNOSIS — E1165 Type 2 diabetes mellitus with hyperglycemia: Secondary | ICD-10-CM | POA: Diagnosis not present

## 2020-10-24 DIAGNOSIS — M6281 Muscle weakness (generalized): Secondary | ICD-10-CM | POA: Diagnosis not present

## 2020-10-24 DIAGNOSIS — Z8616 Personal history of COVID-19: Secondary | ICD-10-CM | POA: Diagnosis not present

## 2020-10-24 DIAGNOSIS — R2689 Other abnormalities of gait and mobility: Secondary | ICD-10-CM | POA: Diagnosis not present

## 2020-10-25 DIAGNOSIS — R2689 Other abnormalities of gait and mobility: Secondary | ICD-10-CM | POA: Diagnosis not present

## 2020-10-25 DIAGNOSIS — R278 Other lack of coordination: Secondary | ICD-10-CM | POA: Diagnosis not present

## 2020-10-25 DIAGNOSIS — E1165 Type 2 diabetes mellitus with hyperglycemia: Secondary | ICD-10-CM | POA: Diagnosis not present

## 2020-10-25 DIAGNOSIS — Z8616 Personal history of COVID-19: Secondary | ICD-10-CM | POA: Diagnosis not present

## 2020-10-25 DIAGNOSIS — M6281 Muscle weakness (generalized): Secondary | ICD-10-CM | POA: Diagnosis not present

## 2020-10-26 ENCOUNTER — Telehealth: Payer: Self-pay | Admitting: Internal Medicine

## 2020-10-26 DIAGNOSIS — I639 Cerebral infarction, unspecified: Secondary | ICD-10-CM | POA: Diagnosis not present

## 2020-10-26 DIAGNOSIS — R569 Unspecified convulsions: Secondary | ICD-10-CM | POA: Diagnosis not present

## 2020-10-26 DIAGNOSIS — G9340 Encephalopathy, unspecified: Secondary | ICD-10-CM | POA: Diagnosis not present

## 2020-10-26 DIAGNOSIS — Z8616 Personal history of COVID-19: Secondary | ICD-10-CM | POA: Diagnosis not present

## 2020-10-26 DIAGNOSIS — R278 Other lack of coordination: Secondary | ICD-10-CM | POA: Diagnosis not present

## 2020-10-26 DIAGNOSIS — E1165 Type 2 diabetes mellitus with hyperglycemia: Secondary | ICD-10-CM | POA: Diagnosis not present

## 2020-10-26 DIAGNOSIS — R2689 Other abnormalities of gait and mobility: Secondary | ICD-10-CM | POA: Diagnosis not present

## 2020-10-26 DIAGNOSIS — M6281 Muscle weakness (generalized): Secondary | ICD-10-CM | POA: Diagnosis not present

## 2020-10-26 DIAGNOSIS — R1310 Dysphagia, unspecified: Secondary | ICD-10-CM | POA: Diagnosis not present

## 2020-10-26 NOTE — Progress Notes (Signed)
  Chronic Care Management   Outreach Note  10/26/2020 Name: RAILYNN BALLO MRN: [id number] DOB: March 25, 1961  Referred by: Hoyt Koch, MD Reason for referral : No chief complaint on file.   An unsuccessful telephone outreach was attempted today. The patient was referred to the pharmacist for assistance with care management and care coordination.   Follow Up Plan:   Carley Perdue [employer] Scheduler

## 2020-10-27 DIAGNOSIS — R2689 Other abnormalities of gait and mobility: Secondary | ICD-10-CM | POA: Diagnosis not present

## 2020-10-27 DIAGNOSIS — M6281 Muscle weakness (generalized): Secondary | ICD-10-CM | POA: Diagnosis not present

## 2020-10-27 DIAGNOSIS — R278 Other lack of coordination: Secondary | ICD-10-CM | POA: Diagnosis not present

## 2020-10-27 DIAGNOSIS — Z8616 Personal history of COVID-19: Secondary | ICD-10-CM | POA: Diagnosis not present

## 2020-10-27 DIAGNOSIS — E1165 Type 2 diabetes mellitus with hyperglycemia: Secondary | ICD-10-CM | POA: Diagnosis not present

## 2020-10-28 DIAGNOSIS — R278 Other lack of coordination: Secondary | ICD-10-CM | POA: Diagnosis not present

## 2020-10-28 DIAGNOSIS — M6281 Muscle weakness (generalized): Secondary | ICD-10-CM | POA: Diagnosis not present

## 2020-10-28 DIAGNOSIS — E1165 Type 2 diabetes mellitus with hyperglycemia: Secondary | ICD-10-CM | POA: Diagnosis not present

## 2020-10-28 DIAGNOSIS — R2689 Other abnormalities of gait and mobility: Secondary | ICD-10-CM | POA: Diagnosis not present

## 2020-10-28 DIAGNOSIS — Z8616 Personal history of COVID-19: Secondary | ICD-10-CM | POA: Diagnosis not present

## 2020-10-31 DIAGNOSIS — E1165 Type 2 diabetes mellitus with hyperglycemia: Secondary | ICD-10-CM | POA: Diagnosis not present

## 2020-10-31 DIAGNOSIS — Z8616 Personal history of COVID-19: Secondary | ICD-10-CM | POA: Diagnosis not present

## 2020-10-31 DIAGNOSIS — R2689 Other abnormalities of gait and mobility: Secondary | ICD-10-CM | POA: Diagnosis not present

## 2020-10-31 DIAGNOSIS — R278 Other lack of coordination: Secondary | ICD-10-CM | POA: Diagnosis not present

## 2020-10-31 DIAGNOSIS — M6281 Muscle weakness (generalized): Secondary | ICD-10-CM | POA: Diagnosis not present

## 2020-11-01 DIAGNOSIS — M6281 Muscle weakness (generalized): Secondary | ICD-10-CM | POA: Diagnosis not present

## 2020-11-01 DIAGNOSIS — R2689 Other abnormalities of gait and mobility: Secondary | ICD-10-CM | POA: Diagnosis not present

## 2020-11-01 DIAGNOSIS — E1165 Type 2 diabetes mellitus with hyperglycemia: Secondary | ICD-10-CM | POA: Diagnosis not present

## 2020-11-01 DIAGNOSIS — R278 Other lack of coordination: Secondary | ICD-10-CM | POA: Diagnosis not present

## 2020-11-01 DIAGNOSIS — Z8616 Personal history of COVID-19: Secondary | ICD-10-CM | POA: Diagnosis not present

## 2020-11-02 DIAGNOSIS — R278 Other lack of coordination: Secondary | ICD-10-CM | POA: Diagnosis not present

## 2020-11-02 DIAGNOSIS — Z8616 Personal history of COVID-19: Secondary | ICD-10-CM | POA: Diagnosis not present

## 2020-11-02 DIAGNOSIS — E1165 Type 2 diabetes mellitus with hyperglycemia: Secondary | ICD-10-CM | POA: Diagnosis not present

## 2020-11-02 DIAGNOSIS — M6281 Muscle weakness (generalized): Secondary | ICD-10-CM | POA: Diagnosis not present

## 2020-11-02 DIAGNOSIS — R2689 Other abnormalities of gait and mobility: Secondary | ICD-10-CM | POA: Diagnosis not present

## 2020-11-03 DIAGNOSIS — Z8616 Personal history of COVID-19: Secondary | ICD-10-CM | POA: Diagnosis not present

## 2020-11-03 DIAGNOSIS — M6281 Muscle weakness (generalized): Secondary | ICD-10-CM | POA: Diagnosis not present

## 2020-11-03 DIAGNOSIS — R2689 Other abnormalities of gait and mobility: Secondary | ICD-10-CM | POA: Diagnosis not present

## 2020-11-03 DIAGNOSIS — R278 Other lack of coordination: Secondary | ICD-10-CM | POA: Diagnosis not present

## 2020-11-03 DIAGNOSIS — E1165 Type 2 diabetes mellitus with hyperglycemia: Secondary | ICD-10-CM | POA: Diagnosis not present

## 2020-11-04 DIAGNOSIS — E1165 Type 2 diabetes mellitus with hyperglycemia: Secondary | ICD-10-CM | POA: Diagnosis not present

## 2020-11-04 DIAGNOSIS — R2689 Other abnormalities of gait and mobility: Secondary | ICD-10-CM | POA: Diagnosis not present

## 2020-11-04 DIAGNOSIS — Z8616 Personal history of COVID-19: Secondary | ICD-10-CM | POA: Diagnosis not present

## 2020-11-04 DIAGNOSIS — M6281 Muscle weakness (generalized): Secondary | ICD-10-CM | POA: Diagnosis not present

## 2020-11-04 DIAGNOSIS — R278 Other lack of coordination: Secondary | ICD-10-CM | POA: Diagnosis not present

## 2020-11-07 DIAGNOSIS — M6281 Muscle weakness (generalized): Secondary | ICD-10-CM | POA: Diagnosis not present

## 2020-11-07 DIAGNOSIS — E1165 Type 2 diabetes mellitus with hyperglycemia: Secondary | ICD-10-CM | POA: Diagnosis not present

## 2020-11-07 DIAGNOSIS — Z8616 Personal history of COVID-19: Secondary | ICD-10-CM | POA: Diagnosis not present

## 2020-11-07 DIAGNOSIS — R2689 Other abnormalities of gait and mobility: Secondary | ICD-10-CM | POA: Diagnosis not present

## 2020-11-07 DIAGNOSIS — R278 Other lack of coordination: Secondary | ICD-10-CM | POA: Diagnosis not present

## 2020-11-09 DIAGNOSIS — M6281 Muscle weakness (generalized): Secondary | ICD-10-CM | POA: Diagnosis not present

## 2020-11-09 DIAGNOSIS — M2041 Other hammer toe(s) (acquired), right foot: Secondary | ICD-10-CM | POA: Diagnosis not present

## 2020-11-09 DIAGNOSIS — R2689 Other abnormalities of gait and mobility: Secondary | ICD-10-CM | POA: Diagnosis not present

## 2020-11-09 DIAGNOSIS — M79675 Pain in left toe(s): Secondary | ICD-10-CM | POA: Diagnosis not present

## 2020-11-09 DIAGNOSIS — E1140 Type 2 diabetes mellitus with diabetic neuropathy, unspecified: Secondary | ICD-10-CM | POA: Diagnosis not present

## 2020-11-09 DIAGNOSIS — Z8616 Personal history of COVID-19: Secondary | ICD-10-CM | POA: Diagnosis not present

## 2020-11-09 DIAGNOSIS — R278 Other lack of coordination: Secondary | ICD-10-CM | POA: Diagnosis not present

## 2020-11-09 DIAGNOSIS — R262 Difficulty in walking, not elsewhere classified: Secondary | ICD-10-CM | POA: Diagnosis not present

## 2020-11-09 DIAGNOSIS — M79674 Pain in right toe(s): Secondary | ICD-10-CM | POA: Diagnosis not present

## 2020-11-09 DIAGNOSIS — B351 Tinea unguium: Secondary | ICD-10-CM | POA: Diagnosis not present

## 2020-11-09 DIAGNOSIS — Z794 Long term (current) use of insulin: Secondary | ICD-10-CM | POA: Diagnosis not present

## 2020-11-09 DIAGNOSIS — E1165 Type 2 diabetes mellitus with hyperglycemia: Secondary | ICD-10-CM | POA: Diagnosis not present

## 2020-11-10 DIAGNOSIS — R2689 Other abnormalities of gait and mobility: Secondary | ICD-10-CM | POA: Diagnosis not present

## 2020-11-10 DIAGNOSIS — E1165 Type 2 diabetes mellitus with hyperglycemia: Secondary | ICD-10-CM | POA: Diagnosis not present

## 2020-11-10 DIAGNOSIS — Z8616 Personal history of COVID-19: Secondary | ICD-10-CM | POA: Diagnosis not present

## 2020-11-10 DIAGNOSIS — M6281 Muscle weakness (generalized): Secondary | ICD-10-CM | POA: Diagnosis not present

## 2020-11-10 DIAGNOSIS — R278 Other lack of coordination: Secondary | ICD-10-CM | POA: Diagnosis not present

## 2020-11-11 DIAGNOSIS — R2689 Other abnormalities of gait and mobility: Secondary | ICD-10-CM | POA: Diagnosis not present

## 2020-11-11 DIAGNOSIS — E1165 Type 2 diabetes mellitus with hyperglycemia: Secondary | ICD-10-CM | POA: Diagnosis not present

## 2020-11-11 DIAGNOSIS — Z8616 Personal history of COVID-19: Secondary | ICD-10-CM | POA: Diagnosis not present

## 2020-11-11 DIAGNOSIS — M6281 Muscle weakness (generalized): Secondary | ICD-10-CM | POA: Diagnosis not present

## 2020-11-11 DIAGNOSIS — R278 Other lack of coordination: Secondary | ICD-10-CM | POA: Diagnosis not present

## 2020-11-12 DIAGNOSIS — E1165 Type 2 diabetes mellitus with hyperglycemia: Secondary | ICD-10-CM | POA: Diagnosis not present

## 2020-11-12 DIAGNOSIS — R278 Other lack of coordination: Secondary | ICD-10-CM | POA: Diagnosis not present

## 2020-11-12 DIAGNOSIS — R2689 Other abnormalities of gait and mobility: Secondary | ICD-10-CM | POA: Diagnosis not present

## 2020-11-12 DIAGNOSIS — Z8616 Personal history of COVID-19: Secondary | ICD-10-CM | POA: Diagnosis not present

## 2020-11-12 DIAGNOSIS — M6281 Muscle weakness (generalized): Secondary | ICD-10-CM | POA: Diagnosis not present

## 2020-11-14 DIAGNOSIS — Z8616 Personal history of COVID-19: Secondary | ICD-10-CM | POA: Diagnosis not present

## 2020-11-14 DIAGNOSIS — R296 Repeated falls: Secondary | ICD-10-CM | POA: Diagnosis not present

## 2020-11-14 DIAGNOSIS — E1110 Type 2 diabetes mellitus with ketoacidosis without coma: Secondary | ICD-10-CM | POA: Diagnosis not present

## 2020-11-14 DIAGNOSIS — J45901 Unspecified asthma with (acute) exacerbation: Secondary | ICD-10-CM | POA: Diagnosis not present

## 2020-11-14 DIAGNOSIS — R278 Other lack of coordination: Secondary | ICD-10-CM | POA: Diagnosis not present

## 2020-11-14 DIAGNOSIS — E1165 Type 2 diabetes mellitus with hyperglycemia: Secondary | ICD-10-CM | POA: Diagnosis not present

## 2020-11-14 DIAGNOSIS — R2689 Other abnormalities of gait and mobility: Secondary | ICD-10-CM | POA: Diagnosis not present

## 2020-11-14 DIAGNOSIS — M6281 Muscle weakness (generalized): Secondary | ICD-10-CM | POA: Diagnosis not present

## 2020-11-15 DIAGNOSIS — M6281 Muscle weakness (generalized): Secondary | ICD-10-CM | POA: Diagnosis not present

## 2020-11-15 DIAGNOSIS — E1165 Type 2 diabetes mellitus with hyperglycemia: Secondary | ICD-10-CM | POA: Diagnosis not present

## 2020-11-15 DIAGNOSIS — R278 Other lack of coordination: Secondary | ICD-10-CM | POA: Diagnosis not present

## 2020-11-15 DIAGNOSIS — Z8616 Personal history of COVID-19: Secondary | ICD-10-CM | POA: Diagnosis not present

## 2020-11-15 DIAGNOSIS — R2689 Other abnormalities of gait and mobility: Secondary | ICD-10-CM | POA: Diagnosis not present

## 2020-11-16 ENCOUNTER — Telehealth: Payer: Self-pay | Admitting: Internal Medicine

## 2020-11-16 DIAGNOSIS — R2689 Other abnormalities of gait and mobility: Secondary | ICD-10-CM | POA: Diagnosis not present

## 2020-11-16 DIAGNOSIS — E1165 Type 2 diabetes mellitus with hyperglycemia: Secondary | ICD-10-CM | POA: Diagnosis not present

## 2020-11-16 DIAGNOSIS — M6281 Muscle weakness (generalized): Secondary | ICD-10-CM | POA: Diagnosis not present

## 2020-11-16 DIAGNOSIS — R278 Other lack of coordination: Secondary | ICD-10-CM | POA: Diagnosis not present

## 2020-11-16 DIAGNOSIS — Z8616 Personal history of COVID-19: Secondary | ICD-10-CM | POA: Diagnosis not present

## 2020-11-16 NOTE — Progress Notes (Signed)
  Chronic Care Management   Outreach Note  11/16/2020 Name: Susan [name] MRN: [id number] DOB: [birth year]  Referred by: Hoyt Koch, MD Reason for referral : No chief complaint on file.   A second unsuccessful telephone outreach was attempted today. The patient was referred to pharmacist for assistance with care management and care coordination.  Follow Up Plan:   Carley Perdue [employer] Scheduler

## 2020-11-17 DIAGNOSIS — Z8616 Personal history of COVID-19: Secondary | ICD-10-CM | POA: Diagnosis not present

## 2020-11-17 DIAGNOSIS — E1165 Type 2 diabetes mellitus with hyperglycemia: Secondary | ICD-10-CM | POA: Diagnosis not present

## 2020-11-17 DIAGNOSIS — M6281 Muscle weakness (generalized): Secondary | ICD-10-CM | POA: Diagnosis not present

## 2020-11-17 DIAGNOSIS — R2689 Other abnormalities of gait and mobility: Secondary | ICD-10-CM | POA: Diagnosis not present

## 2020-11-17 DIAGNOSIS — R278 Other lack of coordination: Secondary | ICD-10-CM | POA: Diagnosis not present

## 2020-11-18 DIAGNOSIS — Z8616 Personal history of COVID-19: Secondary | ICD-10-CM | POA: Diagnosis not present

## 2020-11-18 DIAGNOSIS — R278 Other lack of coordination: Secondary | ICD-10-CM | POA: Diagnosis not present

## 2020-11-18 DIAGNOSIS — R2689 Other abnormalities of gait and mobility: Secondary | ICD-10-CM | POA: Diagnosis not present

## 2020-11-18 DIAGNOSIS — E1165 Type 2 diabetes mellitus with hyperglycemia: Secondary | ICD-10-CM | POA: Diagnosis not present

## 2020-11-18 DIAGNOSIS — M6281 Muscle weakness (generalized): Secondary | ICD-10-CM | POA: Diagnosis not present

## 2020-11-21 DIAGNOSIS — R278 Other lack of coordination: Secondary | ICD-10-CM | POA: Diagnosis not present

## 2020-11-21 DIAGNOSIS — E1165 Type 2 diabetes mellitus with hyperglycemia: Secondary | ICD-10-CM | POA: Diagnosis not present

## 2020-11-21 DIAGNOSIS — R2689 Other abnormalities of gait and mobility: Secondary | ICD-10-CM | POA: Diagnosis not present

## 2020-11-21 DIAGNOSIS — Z8616 Personal history of COVID-19: Secondary | ICD-10-CM | POA: Diagnosis not present

## 2020-11-21 DIAGNOSIS — M6281 Muscle weakness (generalized): Secondary | ICD-10-CM | POA: Diagnosis not present

## 2020-11-22 DIAGNOSIS — R278 Other lack of coordination: Secondary | ICD-10-CM | POA: Diagnosis not present

## 2020-11-22 DIAGNOSIS — E1165 Type 2 diabetes mellitus with hyperglycemia: Secondary | ICD-10-CM | POA: Diagnosis not present

## 2020-11-22 DIAGNOSIS — R2689 Other abnormalities of gait and mobility: Secondary | ICD-10-CM | POA: Diagnosis not present

## 2020-11-22 DIAGNOSIS — M6281 Muscle weakness (generalized): Secondary | ICD-10-CM | POA: Diagnosis not present

## 2020-11-22 DIAGNOSIS — Z8616 Personal history of COVID-19: Secondary | ICD-10-CM | POA: Diagnosis not present

## 2020-11-23 DIAGNOSIS — E1165 Type 2 diabetes mellitus with hyperglycemia: Secondary | ICD-10-CM | POA: Diagnosis not present

## 2020-11-23 DIAGNOSIS — M6281 Muscle weakness (generalized): Secondary | ICD-10-CM | POA: Diagnosis not present

## 2020-11-23 DIAGNOSIS — Z8616 Personal history of COVID-19: Secondary | ICD-10-CM | POA: Diagnosis not present

## 2020-11-23 DIAGNOSIS — R278 Other lack of coordination: Secondary | ICD-10-CM | POA: Diagnosis not present

## 2020-11-23 DIAGNOSIS — R2689 Other abnormalities of gait and mobility: Secondary | ICD-10-CM | POA: Diagnosis not present

## 2020-11-24 DIAGNOSIS — R278 Other lack of coordination: Secondary | ICD-10-CM | POA: Diagnosis not present

## 2020-11-24 DIAGNOSIS — Z8616 Personal history of COVID-19: Secondary | ICD-10-CM | POA: Diagnosis not present

## 2020-11-24 DIAGNOSIS — R2689 Other abnormalities of gait and mobility: Secondary | ICD-10-CM | POA: Diagnosis not present

## 2020-11-24 DIAGNOSIS — M6281 Muscle weakness (generalized): Secondary | ICD-10-CM | POA: Diagnosis not present

## 2020-11-24 DIAGNOSIS — E1165 Type 2 diabetes mellitus with hyperglycemia: Secondary | ICD-10-CM | POA: Diagnosis not present

## 2020-12-06 ENCOUNTER — Telehealth: Payer: Self-pay | Admitting: Internal Medicine

## 2020-12-06 NOTE — Progress Notes (Signed)
  Chronic Care Management   Outreach Note  12/06/2020 Name: BRAILEIGH LANDENBERGER MRN: [id number] DOB: [birth year]  Referred by: Hoyt Koch, MD Reason for referral : No chief complaint on file.   Third unsuccessful telephone outreach was attempted today. The patient was referred to the pharmacist for assistance with care management and care coordination.   Follow Up Plan:   Carley Perdue [employer] Scheduler

## 2020-12-15 DIAGNOSIS — R296 Repeated falls: Secondary | ICD-10-CM | POA: Diagnosis not present

## 2020-12-15 DIAGNOSIS — J45901 Unspecified asthma with (acute) exacerbation: Secondary | ICD-10-CM | POA: Diagnosis not present

## 2020-12-15 DIAGNOSIS — E1110 Type 2 diabetes mellitus with ketoacidosis without coma: Secondary | ICD-10-CM | POA: Diagnosis not present

## 2020-12-21 DIAGNOSIS — E1165 Type 2 diabetes mellitus with hyperglycemia: Secondary | ICD-10-CM | POA: Diagnosis not present

## 2020-12-21 DIAGNOSIS — R569 Unspecified convulsions: Secondary | ICD-10-CM | POA: Diagnosis not present

## 2020-12-21 DIAGNOSIS — R059 Cough, unspecified: Secondary | ICD-10-CM | POA: Diagnosis not present

## 2020-12-21 DIAGNOSIS — R062 Wheezing: Secondary | ICD-10-CM | POA: Diagnosis not present

## 2020-12-21 DIAGNOSIS — I639 Cerebral infarction, unspecified: Secondary | ICD-10-CM | POA: Diagnosis not present

## 2020-12-21 DIAGNOSIS — G9340 Encephalopathy, unspecified: Secondary | ICD-10-CM | POA: Diagnosis not present

## 2020-12-21 DIAGNOSIS — R1310 Dysphagia, unspecified: Secondary | ICD-10-CM | POA: Diagnosis not present

## 2021-01-02 DIAGNOSIS — R1312 Dysphagia, oropharyngeal phase: Secondary | ICD-10-CM | POA: Diagnosis not present

## 2021-01-02 DIAGNOSIS — M24541 Contracture, right hand: Secondary | ICD-10-CM | POA: Diagnosis not present

## 2021-01-03 DIAGNOSIS — M24541 Contracture, right hand: Secondary | ICD-10-CM | POA: Diagnosis not present

## 2021-01-03 DIAGNOSIS — R1312 Dysphagia, oropharyngeal phase: Secondary | ICD-10-CM | POA: Diagnosis not present

## 2021-01-04 DIAGNOSIS — M24541 Contracture, right hand: Secondary | ICD-10-CM | POA: Diagnosis not present

## 2021-01-04 DIAGNOSIS — R1312 Dysphagia, oropharyngeal phase: Secondary | ICD-10-CM | POA: Diagnosis not present

## 2021-01-05 DIAGNOSIS — R1312 Dysphagia, oropharyngeal phase: Secondary | ICD-10-CM | POA: Diagnosis not present

## 2021-01-05 DIAGNOSIS — M24541 Contracture, right hand: Secondary | ICD-10-CM | POA: Diagnosis not present

## 2021-01-06 DIAGNOSIS — R1312 Dysphagia, oropharyngeal phase: Secondary | ICD-10-CM | POA: Diagnosis not present

## 2021-01-06 DIAGNOSIS — M24541 Contracture, right hand: Secondary | ICD-10-CM | POA: Diagnosis not present

## 2021-01-07 DIAGNOSIS — M24541 Contracture, right hand: Secondary | ICD-10-CM | POA: Diagnosis not present

## 2021-01-07 DIAGNOSIS — R1312 Dysphagia, oropharyngeal phase: Secondary | ICD-10-CM | POA: Diagnosis not present

## 2021-01-09 DIAGNOSIS — R1312 Dysphagia, oropharyngeal phase: Secondary | ICD-10-CM | POA: Diagnosis not present

## 2021-01-09 DIAGNOSIS — M24541 Contracture, right hand: Secondary | ICD-10-CM | POA: Diagnosis not present

## 2021-01-10 DIAGNOSIS — R1312 Dysphagia, oropharyngeal phase: Secondary | ICD-10-CM | POA: Diagnosis not present

## 2021-01-10 DIAGNOSIS — M24541 Contracture, right hand: Secondary | ICD-10-CM | POA: Diagnosis not present

## 2021-01-11 DIAGNOSIS — M24541 Contracture, right hand: Secondary | ICD-10-CM | POA: Diagnosis not present

## 2021-01-11 DIAGNOSIS — R1312 Dysphagia, oropharyngeal phase: Secondary | ICD-10-CM | POA: Diagnosis not present

## 2021-01-12 DIAGNOSIS — M24541 Contracture, right hand: Secondary | ICD-10-CM | POA: Diagnosis not present

## 2021-01-12 DIAGNOSIS — R1312 Dysphagia, oropharyngeal phase: Secondary | ICD-10-CM | POA: Diagnosis not present

## 2021-01-13 DIAGNOSIS — R1312 Dysphagia, oropharyngeal phase: Secondary | ICD-10-CM | POA: Diagnosis not present

## 2021-01-13 DIAGNOSIS — M24541 Contracture, right hand: Secondary | ICD-10-CM | POA: Diagnosis not present

## 2021-01-16 DIAGNOSIS — R1312 Dysphagia, oropharyngeal phase: Secondary | ICD-10-CM | POA: Diagnosis not present

## 2021-01-16 DIAGNOSIS — M24541 Contracture, right hand: Secondary | ICD-10-CM | POA: Diagnosis not present

## 2021-01-17 DIAGNOSIS — R1312 Dysphagia, oropharyngeal phase: Secondary | ICD-10-CM | POA: Diagnosis not present

## 2021-01-17 DIAGNOSIS — M24541 Contracture, right hand: Secondary | ICD-10-CM | POA: Diagnosis not present

## 2021-01-18 DIAGNOSIS — R1312 Dysphagia, oropharyngeal phase: Secondary | ICD-10-CM | POA: Diagnosis not present

## 2021-01-18 DIAGNOSIS — M24541 Contracture, right hand: Secondary | ICD-10-CM | POA: Diagnosis not present

## 2021-01-19 DIAGNOSIS — M24541 Contracture, right hand: Secondary | ICD-10-CM | POA: Diagnosis not present

## 2021-01-19 DIAGNOSIS — R1312 Dysphagia, oropharyngeal phase: Secondary | ICD-10-CM | POA: Diagnosis not present

## 2021-01-20 DIAGNOSIS — R1312 Dysphagia, oropharyngeal phase: Secondary | ICD-10-CM | POA: Diagnosis not present

## 2021-01-20 DIAGNOSIS — M24541 Contracture, right hand: Secondary | ICD-10-CM | POA: Diagnosis not present

## 2021-01-23 DIAGNOSIS — R1312 Dysphagia, oropharyngeal phase: Secondary | ICD-10-CM | POA: Diagnosis not present

## 2021-01-23 DIAGNOSIS — M24541 Contracture, right hand: Secondary | ICD-10-CM | POA: Diagnosis not present

## 2021-01-24 DIAGNOSIS — R1312 Dysphagia, oropharyngeal phase: Secondary | ICD-10-CM | POA: Diagnosis not present

## 2021-01-24 DIAGNOSIS — M24541 Contracture, right hand: Secondary | ICD-10-CM | POA: Diagnosis not present

## 2021-01-25 DIAGNOSIS — R1312 Dysphagia, oropharyngeal phase: Secondary | ICD-10-CM | POA: Diagnosis not present

## 2021-01-25 DIAGNOSIS — M24541 Contracture, right hand: Secondary | ICD-10-CM | POA: Diagnosis not present

## 2021-01-26 DIAGNOSIS — M24541 Contracture, right hand: Secondary | ICD-10-CM | POA: Diagnosis not present

## 2021-01-26 DIAGNOSIS — R1312 Dysphagia, oropharyngeal phase: Secondary | ICD-10-CM | POA: Diagnosis not present

## 2021-01-27 DIAGNOSIS — R1312 Dysphagia, oropharyngeal phase: Secondary | ICD-10-CM | POA: Diagnosis not present

## 2021-01-27 DIAGNOSIS — M24541 Contracture, right hand: Secondary | ICD-10-CM | POA: Diagnosis not present

## 2021-01-30 DIAGNOSIS — R1312 Dysphagia, oropharyngeal phase: Secondary | ICD-10-CM | POA: Diagnosis not present

## 2021-01-30 DIAGNOSIS — M24541 Contracture, right hand: Secondary | ICD-10-CM | POA: Diagnosis not present

## 2021-01-31 DIAGNOSIS — L853 Xerosis cutis: Secondary | ICD-10-CM | POA: Diagnosis not present

## 2021-01-31 DIAGNOSIS — R1312 Dysphagia, oropharyngeal phase: Secondary | ICD-10-CM | POA: Diagnosis not present

## 2021-01-31 DIAGNOSIS — L602 Onychogryphosis: Secondary | ICD-10-CM | POA: Diagnosis not present

## 2021-01-31 DIAGNOSIS — E1140 Type 2 diabetes mellitus with diabetic neuropathy, unspecified: Secondary | ICD-10-CM | POA: Diagnosis not present

## 2021-01-31 DIAGNOSIS — M24541 Contracture, right hand: Secondary | ICD-10-CM | POA: Diagnosis not present

## 2021-02-01 DIAGNOSIS — M24541 Contracture, right hand: Secondary | ICD-10-CM | POA: Diagnosis not present

## 2021-02-01 DIAGNOSIS — R1312 Dysphagia, oropharyngeal phase: Secondary | ICD-10-CM | POA: Diagnosis not present

## 2021-02-01 DIAGNOSIS — R2689 Other abnormalities of gait and mobility: Secondary | ICD-10-CM | POA: Diagnosis not present

## 2021-02-01 DIAGNOSIS — R2681 Unsteadiness on feet: Secondary | ICD-10-CM | POA: Diagnosis not present

## 2021-02-01 DIAGNOSIS — M6281 Muscle weakness (generalized): Secondary | ICD-10-CM | POA: Diagnosis not present

## 2021-02-02 DIAGNOSIS — R2689 Other abnormalities of gait and mobility: Secondary | ICD-10-CM | POA: Diagnosis not present

## 2021-02-02 DIAGNOSIS — R2681 Unsteadiness on feet: Secondary | ICD-10-CM | POA: Diagnosis not present

## 2021-02-02 DIAGNOSIS — M24541 Contracture, right hand: Secondary | ICD-10-CM | POA: Diagnosis not present

## 2021-02-02 DIAGNOSIS — M6281 Muscle weakness (generalized): Secondary | ICD-10-CM | POA: Diagnosis not present

## 2021-02-02 DIAGNOSIS — R1312 Dysphagia, oropharyngeal phase: Secondary | ICD-10-CM | POA: Diagnosis not present

## 2021-02-03 DIAGNOSIS — R2681 Unsteadiness on feet: Secondary | ICD-10-CM | POA: Diagnosis not present

## 2021-02-03 DIAGNOSIS — M24541 Contracture, right hand: Secondary | ICD-10-CM | POA: Diagnosis not present

## 2021-02-03 DIAGNOSIS — R1312 Dysphagia, oropharyngeal phase: Secondary | ICD-10-CM | POA: Diagnosis not present

## 2021-02-03 DIAGNOSIS — M6281 Muscle weakness (generalized): Secondary | ICD-10-CM | POA: Diagnosis not present

## 2021-02-03 DIAGNOSIS — R2689 Other abnormalities of gait and mobility: Secondary | ICD-10-CM | POA: Diagnosis not present

## 2021-02-06 DIAGNOSIS — M6281 Muscle weakness (generalized): Secondary | ICD-10-CM | POA: Diagnosis not present

## 2021-02-06 DIAGNOSIS — M24541 Contracture, right hand: Secondary | ICD-10-CM | POA: Diagnosis not present

## 2021-02-06 DIAGNOSIS — R2681 Unsteadiness on feet: Secondary | ICD-10-CM | POA: Diagnosis not present

## 2021-02-06 DIAGNOSIS — R1312 Dysphagia, oropharyngeal phase: Secondary | ICD-10-CM | POA: Diagnosis not present

## 2021-02-06 DIAGNOSIS — R2689 Other abnormalities of gait and mobility: Secondary | ICD-10-CM | POA: Diagnosis not present

## 2021-02-07 DIAGNOSIS — R2689 Other abnormalities of gait and mobility: Secondary | ICD-10-CM | POA: Diagnosis not present

## 2021-02-07 DIAGNOSIS — R1312 Dysphagia, oropharyngeal phase: Secondary | ICD-10-CM | POA: Diagnosis not present

## 2021-02-07 DIAGNOSIS — M6281 Muscle weakness (generalized): Secondary | ICD-10-CM | POA: Diagnosis not present

## 2021-02-07 DIAGNOSIS — R2681 Unsteadiness on feet: Secondary | ICD-10-CM | POA: Diagnosis not present

## 2021-02-07 DIAGNOSIS — M24541 Contracture, right hand: Secondary | ICD-10-CM | POA: Diagnosis not present

## 2021-02-08 DIAGNOSIS — M24541 Contracture, right hand: Secondary | ICD-10-CM | POA: Diagnosis not present

## 2021-02-08 DIAGNOSIS — M6281 Muscle weakness (generalized): Secondary | ICD-10-CM | POA: Diagnosis not present

## 2021-02-08 DIAGNOSIS — R1312 Dysphagia, oropharyngeal phase: Secondary | ICD-10-CM | POA: Diagnosis not present

## 2021-02-08 DIAGNOSIS — R2681 Unsteadiness on feet: Secondary | ICD-10-CM | POA: Diagnosis not present

## 2021-02-08 DIAGNOSIS — R2689 Other abnormalities of gait and mobility: Secondary | ICD-10-CM | POA: Diagnosis not present

## 2021-02-09 DIAGNOSIS — R2689 Other abnormalities of gait and mobility: Secondary | ICD-10-CM | POA: Diagnosis not present

## 2021-02-09 DIAGNOSIS — R2681 Unsteadiness on feet: Secondary | ICD-10-CM | POA: Diagnosis not present

## 2021-02-09 DIAGNOSIS — M6281 Muscle weakness (generalized): Secondary | ICD-10-CM | POA: Diagnosis not present

## 2021-02-09 DIAGNOSIS — M24541 Contracture, right hand: Secondary | ICD-10-CM | POA: Diagnosis not present

## 2021-02-09 DIAGNOSIS — R1312 Dysphagia, oropharyngeal phase: Secondary | ICD-10-CM | POA: Diagnosis not present

## 2021-02-10 DIAGNOSIS — M24541 Contracture, right hand: Secondary | ICD-10-CM | POA: Diagnosis not present

## 2021-02-10 DIAGNOSIS — R2689 Other abnormalities of gait and mobility: Secondary | ICD-10-CM | POA: Diagnosis not present

## 2021-02-10 DIAGNOSIS — R2681 Unsteadiness on feet: Secondary | ICD-10-CM | POA: Diagnosis not present

## 2021-02-10 DIAGNOSIS — M6281 Muscle weakness (generalized): Secondary | ICD-10-CM | POA: Diagnosis not present

## 2021-02-10 DIAGNOSIS — R1312 Dysphagia, oropharyngeal phase: Secondary | ICD-10-CM | POA: Diagnosis not present

## 2021-02-13 DIAGNOSIS — R2689 Other abnormalities of gait and mobility: Secondary | ICD-10-CM | POA: Diagnosis not present

## 2021-02-13 DIAGNOSIS — M6281 Muscle weakness (generalized): Secondary | ICD-10-CM | POA: Diagnosis not present

## 2021-02-13 DIAGNOSIS — M24541 Contracture, right hand: Secondary | ICD-10-CM | POA: Diagnosis not present

## 2021-02-13 DIAGNOSIS — R1312 Dysphagia, oropharyngeal phase: Secondary | ICD-10-CM | POA: Diagnosis not present

## 2021-02-13 DIAGNOSIS — R2681 Unsteadiness on feet: Secondary | ICD-10-CM | POA: Diagnosis not present

## 2021-02-14 DIAGNOSIS — R2681 Unsteadiness on feet: Secondary | ICD-10-CM | POA: Diagnosis not present

## 2021-02-14 DIAGNOSIS — M6281 Muscle weakness (generalized): Secondary | ICD-10-CM | POA: Diagnosis not present

## 2021-02-14 DIAGNOSIS — M24541 Contracture, right hand: Secondary | ICD-10-CM | POA: Diagnosis not present

## 2021-02-14 DIAGNOSIS — R2689 Other abnormalities of gait and mobility: Secondary | ICD-10-CM | POA: Diagnosis not present

## 2021-02-14 DIAGNOSIS — R1312 Dysphagia, oropharyngeal phase: Secondary | ICD-10-CM | POA: Diagnosis not present

## 2021-02-15 DIAGNOSIS — R1310 Dysphagia, unspecified: Secondary | ICD-10-CM | POA: Diagnosis not present

## 2021-02-15 DIAGNOSIS — I639 Cerebral infarction, unspecified: Secondary | ICD-10-CM | POA: Diagnosis not present

## 2021-02-15 DIAGNOSIS — M24541 Contracture, right hand: Secondary | ICD-10-CM | POA: Diagnosis not present

## 2021-02-15 DIAGNOSIS — G9340 Encephalopathy, unspecified: Secondary | ICD-10-CM | POA: Diagnosis not present

## 2021-02-15 DIAGNOSIS — R2689 Other abnormalities of gait and mobility: Secondary | ICD-10-CM | POA: Diagnosis not present

## 2021-02-15 DIAGNOSIS — E1165 Type 2 diabetes mellitus with hyperglycemia: Secondary | ICD-10-CM | POA: Diagnosis not present

## 2021-02-15 DIAGNOSIS — M6281 Muscle weakness (generalized): Secondary | ICD-10-CM | POA: Diagnosis not present

## 2021-02-15 DIAGNOSIS — R059 Cough, unspecified: Secondary | ICD-10-CM | POA: Diagnosis not present

## 2021-02-15 DIAGNOSIS — R2681 Unsteadiness on feet: Secondary | ICD-10-CM | POA: Diagnosis not present

## 2021-02-15 DIAGNOSIS — R1312 Dysphagia, oropharyngeal phase: Secondary | ICD-10-CM | POA: Diagnosis not present

## 2021-02-15 DIAGNOSIS — R569 Unspecified convulsions: Secondary | ICD-10-CM | POA: Diagnosis not present

## 2021-02-16 DIAGNOSIS — M6281 Muscle weakness (generalized): Secondary | ICD-10-CM | POA: Diagnosis not present

## 2021-02-16 DIAGNOSIS — R1312 Dysphagia, oropharyngeal phase: Secondary | ICD-10-CM | POA: Diagnosis not present

## 2021-02-16 DIAGNOSIS — R2681 Unsteadiness on feet: Secondary | ICD-10-CM | POA: Diagnosis not present

## 2021-02-16 DIAGNOSIS — M24541 Contracture, right hand: Secondary | ICD-10-CM | POA: Diagnosis not present

## 2021-02-16 DIAGNOSIS — R2689 Other abnormalities of gait and mobility: Secondary | ICD-10-CM | POA: Diagnosis not present

## 2021-02-17 DIAGNOSIS — R2681 Unsteadiness on feet: Secondary | ICD-10-CM | POA: Diagnosis not present

## 2021-02-17 DIAGNOSIS — M6281 Muscle weakness (generalized): Secondary | ICD-10-CM | POA: Diagnosis not present

## 2021-02-17 DIAGNOSIS — R1312 Dysphagia, oropharyngeal phase: Secondary | ICD-10-CM | POA: Diagnosis not present

## 2021-02-17 DIAGNOSIS — R2689 Other abnormalities of gait and mobility: Secondary | ICD-10-CM | POA: Diagnosis not present

## 2021-02-17 DIAGNOSIS — M24541 Contracture, right hand: Secondary | ICD-10-CM | POA: Diagnosis not present

## 2021-02-20 DIAGNOSIS — M24541 Contracture, right hand: Secondary | ICD-10-CM | POA: Diagnosis not present

## 2021-02-20 DIAGNOSIS — R1312 Dysphagia, oropharyngeal phase: Secondary | ICD-10-CM | POA: Diagnosis not present

## 2021-02-20 DIAGNOSIS — R2689 Other abnormalities of gait and mobility: Secondary | ICD-10-CM | POA: Diagnosis not present

## 2021-02-20 DIAGNOSIS — R2681 Unsteadiness on feet: Secondary | ICD-10-CM | POA: Diagnosis not present

## 2021-02-20 DIAGNOSIS — M6281 Muscle weakness (generalized): Secondary | ICD-10-CM | POA: Diagnosis not present

## 2021-02-21 DIAGNOSIS — M24541 Contracture, right hand: Secondary | ICD-10-CM | POA: Diagnosis not present

## 2021-02-21 DIAGNOSIS — M6281 Muscle weakness (generalized): Secondary | ICD-10-CM | POA: Diagnosis not present

## 2021-02-21 DIAGNOSIS — R1312 Dysphagia, oropharyngeal phase: Secondary | ICD-10-CM | POA: Diagnosis not present

## 2021-02-21 DIAGNOSIS — R2681 Unsteadiness on feet: Secondary | ICD-10-CM | POA: Diagnosis not present

## 2021-02-21 DIAGNOSIS — R2689 Other abnormalities of gait and mobility: Secondary | ICD-10-CM | POA: Diagnosis not present

## 2021-02-22 DIAGNOSIS — R2681 Unsteadiness on feet: Secondary | ICD-10-CM | POA: Diagnosis not present

## 2021-02-22 DIAGNOSIS — R2689 Other abnormalities of gait and mobility: Secondary | ICD-10-CM | POA: Diagnosis not present

## 2021-02-22 DIAGNOSIS — M6281 Muscle weakness (generalized): Secondary | ICD-10-CM | POA: Diagnosis not present

## 2021-02-22 DIAGNOSIS — M24541 Contracture, right hand: Secondary | ICD-10-CM | POA: Diagnosis not present

## 2021-02-22 DIAGNOSIS — R1312 Dysphagia, oropharyngeal phase: Secondary | ICD-10-CM | POA: Diagnosis not present

## 2021-02-23 DIAGNOSIS — R2681 Unsteadiness on feet: Secondary | ICD-10-CM | POA: Diagnosis not present

## 2021-02-23 DIAGNOSIS — R2689 Other abnormalities of gait and mobility: Secondary | ICD-10-CM | POA: Diagnosis not present

## 2021-02-23 DIAGNOSIS — M6281 Muscle weakness (generalized): Secondary | ICD-10-CM | POA: Diagnosis not present

## 2021-02-23 DIAGNOSIS — M24541 Contracture, right hand: Secondary | ICD-10-CM | POA: Diagnosis not present

## 2021-02-23 DIAGNOSIS — R1312 Dysphagia, oropharyngeal phase: Secondary | ICD-10-CM | POA: Diagnosis not present

## 2021-02-24 DIAGNOSIS — M24541 Contracture, right hand: Secondary | ICD-10-CM | POA: Diagnosis not present

## 2021-02-24 DIAGNOSIS — R2681 Unsteadiness on feet: Secondary | ICD-10-CM | POA: Diagnosis not present

## 2021-02-24 DIAGNOSIS — R1312 Dysphagia, oropharyngeal phase: Secondary | ICD-10-CM | POA: Diagnosis not present

## 2021-02-24 DIAGNOSIS — R2689 Other abnormalities of gait and mobility: Secondary | ICD-10-CM | POA: Diagnosis not present

## 2021-02-24 DIAGNOSIS — M6281 Muscle weakness (generalized): Secondary | ICD-10-CM | POA: Diagnosis not present

## 2021-02-25 DIAGNOSIS — R2681 Unsteadiness on feet: Secondary | ICD-10-CM | POA: Diagnosis not present

## 2021-02-25 DIAGNOSIS — R293 Abnormal posture: Secondary | ICD-10-CM | POA: Diagnosis not present

## 2021-02-25 DIAGNOSIS — R296 Repeated falls: Secondary | ICD-10-CM | POA: Diagnosis not present

## 2021-02-25 DIAGNOSIS — R2689 Other abnormalities of gait and mobility: Secondary | ICD-10-CM | POA: Diagnosis not present

## 2021-02-25 DIAGNOSIS — R1312 Dysphagia, oropharyngeal phase: Secondary | ICD-10-CM | POA: Diagnosis not present

## 2021-02-25 DIAGNOSIS — M6281 Muscle weakness (generalized): Secondary | ICD-10-CM | POA: Diagnosis not present

## 2021-02-25 DIAGNOSIS — M24541 Contracture, right hand: Secondary | ICD-10-CM | POA: Diagnosis not present

## 2021-02-27 DIAGNOSIS — R1312 Dysphagia, oropharyngeal phase: Secondary | ICD-10-CM | POA: Diagnosis not present

## 2021-02-27 DIAGNOSIS — R2689 Other abnormalities of gait and mobility: Secondary | ICD-10-CM | POA: Diagnosis not present

## 2021-02-27 DIAGNOSIS — R296 Repeated falls: Secondary | ICD-10-CM | POA: Diagnosis not present

## 2021-02-27 DIAGNOSIS — M6281 Muscle weakness (generalized): Secondary | ICD-10-CM | POA: Diagnosis not present

## 2021-02-27 DIAGNOSIS — R293 Abnormal posture: Secondary | ICD-10-CM | POA: Diagnosis not present

## 2021-02-27 DIAGNOSIS — R2681 Unsteadiness on feet: Secondary | ICD-10-CM | POA: Diagnosis not present

## 2021-02-27 DIAGNOSIS — M24541 Contracture, right hand: Secondary | ICD-10-CM | POA: Diagnosis not present

## 2021-02-28 DIAGNOSIS — R293 Abnormal posture: Secondary | ICD-10-CM | POA: Diagnosis not present

## 2021-02-28 DIAGNOSIS — M24541 Contracture, right hand: Secondary | ICD-10-CM | POA: Diagnosis not present

## 2021-02-28 DIAGNOSIS — R1312 Dysphagia, oropharyngeal phase: Secondary | ICD-10-CM | POA: Diagnosis not present

## 2021-02-28 DIAGNOSIS — M6281 Muscle weakness (generalized): Secondary | ICD-10-CM | POA: Diagnosis not present

## 2021-02-28 DIAGNOSIS — R2681 Unsteadiness on feet: Secondary | ICD-10-CM | POA: Diagnosis not present

## 2021-02-28 DIAGNOSIS — R2689 Other abnormalities of gait and mobility: Secondary | ICD-10-CM | POA: Diagnosis not present

## 2021-02-28 DIAGNOSIS — R296 Repeated falls: Secondary | ICD-10-CM | POA: Diagnosis not present

## 2021-03-01 DIAGNOSIS — R2689 Other abnormalities of gait and mobility: Secondary | ICD-10-CM | POA: Diagnosis not present

## 2021-03-01 DIAGNOSIS — R293 Abnormal posture: Secondary | ICD-10-CM | POA: Diagnosis not present

## 2021-03-01 DIAGNOSIS — M6281 Muscle weakness (generalized): Secondary | ICD-10-CM | POA: Diagnosis not present

## 2021-03-01 DIAGNOSIS — R296 Repeated falls: Secondary | ICD-10-CM | POA: Diagnosis not present

## 2021-03-01 DIAGNOSIS — R2681 Unsteadiness on feet: Secondary | ICD-10-CM | POA: Diagnosis not present

## 2021-03-01 DIAGNOSIS — M24541 Contracture, right hand: Secondary | ICD-10-CM | POA: Diagnosis not present

## 2021-03-01 DIAGNOSIS — R1312 Dysphagia, oropharyngeal phase: Secondary | ICD-10-CM | POA: Diagnosis not present

## 2021-03-02 DIAGNOSIS — R2689 Other abnormalities of gait and mobility: Secondary | ICD-10-CM | POA: Diagnosis not present

## 2021-03-02 DIAGNOSIS — R296 Repeated falls: Secondary | ICD-10-CM | POA: Diagnosis not present

## 2021-03-02 DIAGNOSIS — M6281 Muscle weakness (generalized): Secondary | ICD-10-CM | POA: Diagnosis not present

## 2021-03-02 DIAGNOSIS — R1312 Dysphagia, oropharyngeal phase: Secondary | ICD-10-CM | POA: Diagnosis not present

## 2021-03-02 DIAGNOSIS — M24541 Contracture, right hand: Secondary | ICD-10-CM | POA: Diagnosis not present

## 2021-03-02 DIAGNOSIS — R2681 Unsteadiness on feet: Secondary | ICD-10-CM | POA: Diagnosis not present

## 2021-03-02 DIAGNOSIS — R293 Abnormal posture: Secondary | ICD-10-CM | POA: Diagnosis not present

## 2021-03-03 DIAGNOSIS — R296 Repeated falls: Secondary | ICD-10-CM | POA: Diagnosis not present

## 2021-03-03 DIAGNOSIS — R1312 Dysphagia, oropharyngeal phase: Secondary | ICD-10-CM | POA: Diagnosis not present

## 2021-03-03 DIAGNOSIS — R293 Abnormal posture: Secondary | ICD-10-CM | POA: Diagnosis not present

## 2021-03-03 DIAGNOSIS — R2689 Other abnormalities of gait and mobility: Secondary | ICD-10-CM | POA: Diagnosis not present

## 2021-03-03 DIAGNOSIS — R2681 Unsteadiness on feet: Secondary | ICD-10-CM | POA: Diagnosis not present

## 2021-03-03 DIAGNOSIS — M6281 Muscle weakness (generalized): Secondary | ICD-10-CM | POA: Diagnosis not present

## 2021-03-06 DIAGNOSIS — R296 Repeated falls: Secondary | ICD-10-CM | POA: Diagnosis not present

## 2021-03-06 DIAGNOSIS — R2689 Other abnormalities of gait and mobility: Secondary | ICD-10-CM | POA: Diagnosis not present

## 2021-03-06 DIAGNOSIS — R2681 Unsteadiness on feet: Secondary | ICD-10-CM | POA: Diagnosis not present

## 2021-03-06 DIAGNOSIS — R293 Abnormal posture: Secondary | ICD-10-CM | POA: Diagnosis not present

## 2021-03-06 DIAGNOSIS — M6281 Muscle weakness (generalized): Secondary | ICD-10-CM | POA: Diagnosis not present

## 2021-03-06 DIAGNOSIS — R1312 Dysphagia, oropharyngeal phase: Secondary | ICD-10-CM | POA: Diagnosis not present

## 2021-03-07 DIAGNOSIS — M6281 Muscle weakness (generalized): Secondary | ICD-10-CM | POA: Diagnosis not present

## 2021-03-07 DIAGNOSIS — R296 Repeated falls: Secondary | ICD-10-CM | POA: Diagnosis not present

## 2021-03-07 DIAGNOSIS — R293 Abnormal posture: Secondary | ICD-10-CM | POA: Diagnosis not present

## 2021-03-07 DIAGNOSIS — R2681 Unsteadiness on feet: Secondary | ICD-10-CM | POA: Diagnosis not present

## 2021-03-07 DIAGNOSIS — R1312 Dysphagia, oropharyngeal phase: Secondary | ICD-10-CM | POA: Diagnosis not present

## 2021-03-07 DIAGNOSIS — R2689 Other abnormalities of gait and mobility: Secondary | ICD-10-CM | POA: Diagnosis not present

## 2021-03-08 DIAGNOSIS — R2689 Other abnormalities of gait and mobility: Secondary | ICD-10-CM | POA: Diagnosis not present

## 2021-03-08 DIAGNOSIS — R293 Abnormal posture: Secondary | ICD-10-CM | POA: Diagnosis not present

## 2021-03-08 DIAGNOSIS — M6281 Muscle weakness (generalized): Secondary | ICD-10-CM | POA: Diagnosis not present

## 2021-03-08 DIAGNOSIS — R1312 Dysphagia, oropharyngeal phase: Secondary | ICD-10-CM | POA: Diagnosis not present

## 2021-03-08 DIAGNOSIS — R296 Repeated falls: Secondary | ICD-10-CM | POA: Diagnosis not present

## 2021-03-08 DIAGNOSIS — R2681 Unsteadiness on feet: Secondary | ICD-10-CM | POA: Diagnosis not present

## 2021-03-09 DIAGNOSIS — M6281 Muscle weakness (generalized): Secondary | ICD-10-CM | POA: Diagnosis not present

## 2021-03-09 DIAGNOSIS — R293 Abnormal posture: Secondary | ICD-10-CM | POA: Diagnosis not present

## 2021-03-09 DIAGNOSIS — R1312 Dysphagia, oropharyngeal phase: Secondary | ICD-10-CM | POA: Diagnosis not present

## 2021-03-09 DIAGNOSIS — R296 Repeated falls: Secondary | ICD-10-CM | POA: Diagnosis not present

## 2021-03-09 DIAGNOSIS — R2689 Other abnormalities of gait and mobility: Secondary | ICD-10-CM | POA: Diagnosis not present

## 2021-03-09 DIAGNOSIS — R2681 Unsteadiness on feet: Secondary | ICD-10-CM | POA: Diagnosis not present

## 2021-03-10 DIAGNOSIS — R2689 Other abnormalities of gait and mobility: Secondary | ICD-10-CM | POA: Diagnosis not present

## 2021-03-10 DIAGNOSIS — R2681 Unsteadiness on feet: Secondary | ICD-10-CM | POA: Diagnosis not present

## 2021-03-10 DIAGNOSIS — R1312 Dysphagia, oropharyngeal phase: Secondary | ICD-10-CM | POA: Diagnosis not present

## 2021-03-10 DIAGNOSIS — R296 Repeated falls: Secondary | ICD-10-CM | POA: Diagnosis not present

## 2021-03-10 DIAGNOSIS — R293 Abnormal posture: Secondary | ICD-10-CM | POA: Diagnosis not present

## 2021-03-10 DIAGNOSIS — M6281 Muscle weakness (generalized): Secondary | ICD-10-CM | POA: Diagnosis not present

## 2021-03-11 DIAGNOSIS — R1312 Dysphagia, oropharyngeal phase: Secondary | ICD-10-CM | POA: Diagnosis not present

## 2021-03-11 DIAGNOSIS — R296 Repeated falls: Secondary | ICD-10-CM | POA: Diagnosis not present

## 2021-03-11 DIAGNOSIS — R2689 Other abnormalities of gait and mobility: Secondary | ICD-10-CM | POA: Diagnosis not present

## 2021-03-11 DIAGNOSIS — R293 Abnormal posture: Secondary | ICD-10-CM | POA: Diagnosis not present

## 2021-03-11 DIAGNOSIS — R2681 Unsteadiness on feet: Secondary | ICD-10-CM | POA: Diagnosis not present

## 2021-03-11 DIAGNOSIS — M6281 Muscle weakness (generalized): Secondary | ICD-10-CM | POA: Diagnosis not present

## 2021-03-13 DIAGNOSIS — R293 Abnormal posture: Secondary | ICD-10-CM | POA: Diagnosis not present

## 2021-03-13 DIAGNOSIS — R2681 Unsteadiness on feet: Secondary | ICD-10-CM | POA: Diagnosis not present

## 2021-03-13 DIAGNOSIS — R2689 Other abnormalities of gait and mobility: Secondary | ICD-10-CM | POA: Diagnosis not present

## 2021-03-13 DIAGNOSIS — M6281 Muscle weakness (generalized): Secondary | ICD-10-CM | POA: Diagnosis not present

## 2021-03-13 DIAGNOSIS — R296 Repeated falls: Secondary | ICD-10-CM | POA: Diagnosis not present

## 2021-03-13 DIAGNOSIS — R1312 Dysphagia, oropharyngeal phase: Secondary | ICD-10-CM | POA: Diagnosis not present

## 2021-03-14 DIAGNOSIS — R293 Abnormal posture: Secondary | ICD-10-CM | POA: Diagnosis not present

## 2021-03-14 DIAGNOSIS — R296 Repeated falls: Secondary | ICD-10-CM | POA: Diagnosis not present

## 2021-03-14 DIAGNOSIS — M6281 Muscle weakness (generalized): Secondary | ICD-10-CM | POA: Diagnosis not present

## 2021-03-14 DIAGNOSIS — R1312 Dysphagia, oropharyngeal phase: Secondary | ICD-10-CM | POA: Diagnosis not present

## 2021-03-14 DIAGNOSIS — R2689 Other abnormalities of gait and mobility: Secondary | ICD-10-CM | POA: Diagnosis not present

## 2021-03-14 DIAGNOSIS — R2681 Unsteadiness on feet: Secondary | ICD-10-CM | POA: Diagnosis not present

## 2021-03-15 DIAGNOSIS — R1312 Dysphagia, oropharyngeal phase: Secondary | ICD-10-CM | POA: Diagnosis not present

## 2021-03-15 DIAGNOSIS — R2681 Unsteadiness on feet: Secondary | ICD-10-CM | POA: Diagnosis not present

## 2021-03-15 DIAGNOSIS — M6281 Muscle weakness (generalized): Secondary | ICD-10-CM | POA: Diagnosis not present

## 2021-03-15 DIAGNOSIS — R296 Repeated falls: Secondary | ICD-10-CM | POA: Diagnosis not present

## 2021-03-15 DIAGNOSIS — R293 Abnormal posture: Secondary | ICD-10-CM | POA: Diagnosis not present

## 2021-03-15 DIAGNOSIS — R2689 Other abnormalities of gait and mobility: Secondary | ICD-10-CM | POA: Diagnosis not present

## 2021-03-16 DIAGNOSIS — R293 Abnormal posture: Secondary | ICD-10-CM | POA: Diagnosis not present

## 2021-03-16 DIAGNOSIS — M6281 Muscle weakness (generalized): Secondary | ICD-10-CM | POA: Diagnosis not present

## 2021-03-16 DIAGNOSIS — R1312 Dysphagia, oropharyngeal phase: Secondary | ICD-10-CM | POA: Diagnosis not present

## 2021-03-16 DIAGNOSIS — R2689 Other abnormalities of gait and mobility: Secondary | ICD-10-CM | POA: Diagnosis not present

## 2021-03-16 DIAGNOSIS — R296 Repeated falls: Secondary | ICD-10-CM | POA: Diagnosis not present

## 2021-03-16 DIAGNOSIS — R2681 Unsteadiness on feet: Secondary | ICD-10-CM | POA: Diagnosis not present

## 2021-03-17 DIAGNOSIS — R293 Abnormal posture: Secondary | ICD-10-CM | POA: Diagnosis not present

## 2021-03-17 DIAGNOSIS — R2689 Other abnormalities of gait and mobility: Secondary | ICD-10-CM | POA: Diagnosis not present

## 2021-03-17 DIAGNOSIS — M6281 Muscle weakness (generalized): Secondary | ICD-10-CM | POA: Diagnosis not present

## 2021-03-17 DIAGNOSIS — R296 Repeated falls: Secondary | ICD-10-CM | POA: Diagnosis not present

## 2021-03-17 DIAGNOSIS — R1312 Dysphagia, oropharyngeal phase: Secondary | ICD-10-CM | POA: Diagnosis not present

## 2021-03-17 DIAGNOSIS — R2681 Unsteadiness on feet: Secondary | ICD-10-CM | POA: Diagnosis not present

## 2021-03-20 DIAGNOSIS — R296 Repeated falls: Secondary | ICD-10-CM | POA: Diagnosis not present

## 2021-03-20 DIAGNOSIS — R1312 Dysphagia, oropharyngeal phase: Secondary | ICD-10-CM | POA: Diagnosis not present

## 2021-03-20 DIAGNOSIS — R2681 Unsteadiness on feet: Secondary | ICD-10-CM | POA: Diagnosis not present

## 2021-03-20 DIAGNOSIS — R2689 Other abnormalities of gait and mobility: Secondary | ICD-10-CM | POA: Diagnosis not present

## 2021-03-20 DIAGNOSIS — M6281 Muscle weakness (generalized): Secondary | ICD-10-CM | POA: Diagnosis not present

## 2021-03-20 DIAGNOSIS — R293 Abnormal posture: Secondary | ICD-10-CM | POA: Diagnosis not present

## 2021-03-21 DIAGNOSIS — R2689 Other abnormalities of gait and mobility: Secondary | ICD-10-CM | POA: Diagnosis not present

## 2021-03-21 DIAGNOSIS — R1312 Dysphagia, oropharyngeal phase: Secondary | ICD-10-CM | POA: Diagnosis not present

## 2021-03-21 DIAGNOSIS — R296 Repeated falls: Secondary | ICD-10-CM | POA: Diagnosis not present

## 2021-03-21 DIAGNOSIS — R293 Abnormal posture: Secondary | ICD-10-CM | POA: Diagnosis not present

## 2021-03-21 DIAGNOSIS — M6281 Muscle weakness (generalized): Secondary | ICD-10-CM | POA: Diagnosis not present

## 2021-03-21 DIAGNOSIS — R2681 Unsteadiness on feet: Secondary | ICD-10-CM | POA: Diagnosis not present

## 2021-03-22 DIAGNOSIS — R2689 Other abnormalities of gait and mobility: Secondary | ICD-10-CM | POA: Diagnosis not present

## 2021-03-22 DIAGNOSIS — R1312 Dysphagia, oropharyngeal phase: Secondary | ICD-10-CM | POA: Diagnosis not present

## 2021-03-22 DIAGNOSIS — R293 Abnormal posture: Secondary | ICD-10-CM | POA: Diagnosis not present

## 2021-03-22 DIAGNOSIS — R2681 Unsteadiness on feet: Secondary | ICD-10-CM | POA: Diagnosis not present

## 2021-03-22 DIAGNOSIS — R799 Abnormal finding of blood chemistry, unspecified: Secondary | ICD-10-CM | POA: Diagnosis not present

## 2021-03-22 DIAGNOSIS — R296 Repeated falls: Secondary | ICD-10-CM | POA: Diagnosis not present

## 2021-03-22 DIAGNOSIS — Z79899 Other long term (current) drug therapy: Secondary | ICD-10-CM | POA: Diagnosis not present

## 2021-03-22 DIAGNOSIS — M6281 Muscle weakness (generalized): Secondary | ICD-10-CM | POA: Diagnosis not present

## 2021-03-23 DIAGNOSIS — R2681 Unsteadiness on feet: Secondary | ICD-10-CM | POA: Diagnosis not present

## 2021-03-23 DIAGNOSIS — M6281 Muscle weakness (generalized): Secondary | ICD-10-CM | POA: Diagnosis not present

## 2021-03-23 DIAGNOSIS — R1312 Dysphagia, oropharyngeal phase: Secondary | ICD-10-CM | POA: Diagnosis not present

## 2021-03-23 DIAGNOSIS — R293 Abnormal posture: Secondary | ICD-10-CM | POA: Diagnosis not present

## 2021-03-23 DIAGNOSIS — R2689 Other abnormalities of gait and mobility: Secondary | ICD-10-CM | POA: Diagnosis not present

## 2021-03-23 DIAGNOSIS — R296 Repeated falls: Secondary | ICD-10-CM | POA: Diagnosis not present

## 2021-03-24 DIAGNOSIS — R2681 Unsteadiness on feet: Secondary | ICD-10-CM | POA: Diagnosis not present

## 2021-03-24 DIAGNOSIS — R1312 Dysphagia, oropharyngeal phase: Secondary | ICD-10-CM | POA: Diagnosis not present

## 2021-03-24 DIAGNOSIS — R293 Abnormal posture: Secondary | ICD-10-CM | POA: Diagnosis not present

## 2021-03-24 DIAGNOSIS — M6281 Muscle weakness (generalized): Secondary | ICD-10-CM | POA: Diagnosis not present

## 2021-03-24 DIAGNOSIS — R296 Repeated falls: Secondary | ICD-10-CM | POA: Diagnosis not present

## 2021-03-24 DIAGNOSIS — R2689 Other abnormalities of gait and mobility: Secondary | ICD-10-CM | POA: Diagnosis not present

## 2021-03-27 DIAGNOSIS — R293 Abnormal posture: Secondary | ICD-10-CM | POA: Diagnosis not present

## 2021-03-27 DIAGNOSIS — R2681 Unsteadiness on feet: Secondary | ICD-10-CM | POA: Diagnosis not present

## 2021-03-27 DIAGNOSIS — R1312 Dysphagia, oropharyngeal phase: Secondary | ICD-10-CM | POA: Diagnosis not present

## 2021-03-27 DIAGNOSIS — M6281 Muscle weakness (generalized): Secondary | ICD-10-CM | POA: Diagnosis not present

## 2021-03-27 DIAGNOSIS — R2689 Other abnormalities of gait and mobility: Secondary | ICD-10-CM | POA: Diagnosis not present

## 2021-03-27 DIAGNOSIS — R296 Repeated falls: Secondary | ICD-10-CM | POA: Diagnosis not present

## 2021-03-28 DIAGNOSIS — R1312 Dysphagia, oropharyngeal phase: Secondary | ICD-10-CM | POA: Diagnosis not present

## 2021-03-28 DIAGNOSIS — R293 Abnormal posture: Secondary | ICD-10-CM | POA: Diagnosis not present

## 2021-03-28 DIAGNOSIS — R296 Repeated falls: Secondary | ICD-10-CM | POA: Diagnosis not present

## 2021-03-28 DIAGNOSIS — R2681 Unsteadiness on feet: Secondary | ICD-10-CM | POA: Diagnosis not present

## 2021-03-28 DIAGNOSIS — R2689 Other abnormalities of gait and mobility: Secondary | ICD-10-CM | POA: Diagnosis not present

## 2021-03-28 DIAGNOSIS — M6281 Muscle weakness (generalized): Secondary | ICD-10-CM | POA: Diagnosis not present

## 2021-03-29 DIAGNOSIS — R296 Repeated falls: Secondary | ICD-10-CM | POA: Diagnosis not present

## 2021-03-29 DIAGNOSIS — R2689 Other abnormalities of gait and mobility: Secondary | ICD-10-CM | POA: Diagnosis not present

## 2021-03-29 DIAGNOSIS — R2681 Unsteadiness on feet: Secondary | ICD-10-CM | POA: Diagnosis not present

## 2021-03-29 DIAGNOSIS — R1312 Dysphagia, oropharyngeal phase: Secondary | ICD-10-CM | POA: Diagnosis not present

## 2021-03-29 DIAGNOSIS — M6281 Muscle weakness (generalized): Secondary | ICD-10-CM | POA: Diagnosis not present

## 2021-03-29 DIAGNOSIS — R293 Abnormal posture: Secondary | ICD-10-CM | POA: Diagnosis not present

## 2021-03-30 DIAGNOSIS — R2689 Other abnormalities of gait and mobility: Secondary | ICD-10-CM | POA: Diagnosis not present

## 2021-03-30 DIAGNOSIS — M6281 Muscle weakness (generalized): Secondary | ICD-10-CM | POA: Diagnosis not present

## 2021-03-30 DIAGNOSIS — R1312 Dysphagia, oropharyngeal phase: Secondary | ICD-10-CM | POA: Diagnosis not present

## 2021-03-30 DIAGNOSIS — R296 Repeated falls: Secondary | ICD-10-CM | POA: Diagnosis not present

## 2021-03-30 DIAGNOSIS — R2681 Unsteadiness on feet: Secondary | ICD-10-CM | POA: Diagnosis not present

## 2021-03-30 DIAGNOSIS — R293 Abnormal posture: Secondary | ICD-10-CM | POA: Diagnosis not present

## 2021-03-31 DIAGNOSIS — R2681 Unsteadiness on feet: Secondary | ICD-10-CM | POA: Diagnosis not present

## 2021-03-31 DIAGNOSIS — R1312 Dysphagia, oropharyngeal phase: Secondary | ICD-10-CM | POA: Diagnosis not present

## 2021-03-31 DIAGNOSIS — R2689 Other abnormalities of gait and mobility: Secondary | ICD-10-CM | POA: Diagnosis not present

## 2021-03-31 DIAGNOSIS — R293 Abnormal posture: Secondary | ICD-10-CM | POA: Diagnosis not present

## 2021-03-31 DIAGNOSIS — M6281 Muscle weakness (generalized): Secondary | ICD-10-CM | POA: Diagnosis not present

## 2021-03-31 DIAGNOSIS — R296 Repeated falls: Secondary | ICD-10-CM | POA: Diagnosis not present

## 2021-04-01 ENCOUNTER — Encounter (HOSPITAL_COMMUNITY): Payer: Self-pay

## 2021-04-01 ENCOUNTER — Emergency Department (HOSPITAL_COMMUNITY): Payer: Medicare Other

## 2021-04-01 ENCOUNTER — Emergency Department (HOSPITAL_COMMUNITY)
Admission: EM | Admit: 2021-04-01 | Discharge: 2021-04-01 | Disposition: A | Discharge status: Home / Self Care | Payer: Medicare Other | Attending: Emergency Medicine | Admitting: Emergency Medicine

## 2021-04-01 DIAGNOSIS — R791 Abnormal coagulation profile: Secondary | ICD-10-CM | POA: Insufficient documentation

## 2021-04-01 DIAGNOSIS — R4182 Altered mental status, unspecified: Secondary | ICD-10-CM | POA: Diagnosis not present

## 2021-04-01 DIAGNOSIS — R531 Weakness: Secondary | ICD-10-CM | POA: Diagnosis not present

## 2021-04-01 DIAGNOSIS — N898 Other specified noninflammatory disorders of vagina: Secondary | ICD-10-CM | POA: Insufficient documentation

## 2021-04-01 DIAGNOSIS — Z7951 Long term (current) use of inhaled steroids: Secondary | ICD-10-CM | POA: Diagnosis not present

## 2021-04-01 DIAGNOSIS — E11319 Type 2 diabetes mellitus with unspecified diabetic retinopathy without macular edema: Secondary | ICD-10-CM | POA: Diagnosis not present

## 2021-04-01 DIAGNOSIS — E041 Nontoxic single thyroid nodule: Secondary | ICD-10-CM | POA: Diagnosis not present

## 2021-04-01 DIAGNOSIS — S0990XA Unspecified injury of head, initial encounter: Secondary | ICD-10-CM | POA: Diagnosis not present

## 2021-04-01 DIAGNOSIS — Z20822 Contact with and (suspected) exposure to covid-19: Secondary | ICD-10-CM | POA: Insufficient documentation

## 2021-04-01 DIAGNOSIS — Y92129 Unspecified place in nursing home as the place of occurrence of the external cause: Secondary | ICD-10-CM | POA: Insufficient documentation

## 2021-04-01 DIAGNOSIS — I10 Essential (primary) hypertension: Secondary | ICD-10-CM | POA: Diagnosis not present

## 2021-04-01 DIAGNOSIS — Z21 Asymptomatic human immunodeficiency virus [HIV] infection status: Secondary | ICD-10-CM | POA: Diagnosis not present

## 2021-04-01 DIAGNOSIS — Z79899 Other long term (current) drug therapy: Secondary | ICD-10-CM | POA: Diagnosis not present

## 2021-04-01 DIAGNOSIS — R918 Other nonspecific abnormal finding of lung field: Secondary | ICD-10-CM | POA: Diagnosis not present

## 2021-04-01 DIAGNOSIS — W19XXXA Unspecified fall, initial encounter: Secondary | ICD-10-CM | POA: Diagnosis not present

## 2021-04-01 DIAGNOSIS — F17210 Nicotine dependence, cigarettes, uncomplicated: Secondary | ICD-10-CM | POA: Insufficient documentation

## 2021-04-01 DIAGNOSIS — J45909 Unspecified asthma, uncomplicated: Secondary | ICD-10-CM | POA: Insufficient documentation

## 2021-04-01 DIAGNOSIS — W050XXA Fall from non-moving wheelchair, initial encounter: Secondary | ICD-10-CM | POA: Insufficient documentation

## 2021-04-01 DIAGNOSIS — Z794 Long term (current) use of insulin: Secondary | ICD-10-CM | POA: Diagnosis not present

## 2021-04-01 DIAGNOSIS — Z743 Need for continuous supervision: Secondary | ICD-10-CM | POA: Diagnosis not present

## 2021-04-01 DIAGNOSIS — I959 Hypotension, unspecified: Secondary | ICD-10-CM | POA: Insufficient documentation

## 2021-04-01 DIAGNOSIS — R29898 Other symptoms and signs involving the musculoskeletal system: Secondary | ICD-10-CM | POA: Diagnosis not present

## 2021-04-01 DIAGNOSIS — E1140 Type 2 diabetes mellitus with diabetic neuropathy, unspecified: Secondary | ICD-10-CM | POA: Diagnosis not present

## 2021-04-01 DIAGNOSIS — Z043 Encounter for examination and observation following other accident: Secondary | ICD-10-CM | POA: Diagnosis not present

## 2021-04-01 DIAGNOSIS — R404 Transient alteration of awareness: Secondary | ICD-10-CM | POA: Diagnosis not present

## 2021-04-01 LAB — URINALYSIS, ROUTINE W REFLEX MICROSCOPIC
Bilirubin Urine: NEGATIVE
Glucose, UA: NEGATIVE mg/dL
Hgb urine dipstick: NEGATIVE
Ketones, ur: NEGATIVE mg/dL
Nitrite: NEGATIVE
Protein, ur: NEGATIVE mg/dL
Specific Gravity, Urine: 1.016 (ref 1.005–1.030)
pH: 5 (ref 5.0–8.0)

## 2021-04-01 LAB — COMPREHENSIVE METABOLIC PANEL
ALT: 20 U/L (ref 0–44)
AST: 20 U/L (ref 15–41)
Albumin: 3.8 g/dL (ref 3.5–5.0)
Alkaline Phosphatase: 92 U/L (ref 38–126)
Anion gap: 11 (ref 5–15)
BUN: 34 mg/dL — ABNORMAL HIGH (ref 6–20)
CO2: 30 mmol/L (ref 22–32)
Calcium: 9.4 mg/dL (ref 8.9–10.3)
Chloride: 103 mmol/L (ref 98–111)
Creatinine, Ser: 0.97 mg/dL (ref 0.44–1.00)
GFR, Estimated: >60 mL/min (ref >60)
Glucose, Bld: 133 mg/dL — ABNORMAL HIGH (ref 70–99)
Potassium: 3.6 mmol/L (ref 3.5–5.1)
Sodium: 144 mmol/L (ref 135–145)
Total Bilirubin: 0.3 mg/dL (ref 0.3–1.2)
Total Protein: 6.6 g/dL (ref 6.5–8.1)

## 2021-04-01 LAB — CBC WITH DIFFERENTIAL/PLATELET
Abs Immature Granulocytes: 0.01 10*3/uL (ref 0.00–0.07)
Basophils Absolute: 0 10*3/uL (ref 0.0–0.1)
Basophils Relative: 1 %
Eosinophils Absolute: 0.2 10*3/uL (ref 0.0–0.5)
Eosinophils Relative: 4 %
HCT: 35.6 % — ABNORMAL LOW (ref 36.0–46.0)
Hemoglobin: 11.8 g/dL — ABNORMAL LOW (ref 12.0–15.0)
Immature Granulocytes: 0 %
Lymphocytes Relative: 46 %
Lymphs Abs: 3.1 10*3/uL (ref 0.7–4.0)
MCH: 33.6 pg (ref 26.0–34.0)
MCHC: 33.1 g/dL (ref 30.0–36.0)
MCV: 101.4 fL — ABNORMAL HIGH (ref 80.0–100.0)
Monocytes Absolute: 0.5 10*3/uL (ref 0.1–1.0)
Monocytes Relative: 7 %
Neutro Abs: 2.8 10*3/uL (ref 1.7–7.7)
Neutrophils Relative %: 42 %
Platelets: 179 10*3/uL (ref 150–400)
RBC: 3.51 MIL/uL — ABNORMAL LOW (ref 3.87–5.11)
RDW: 13.9 % (ref 11.5–15.5)
WBC: 6.6 10*3/uL (ref 4.0–10.5)
nRBC: 0 % (ref 0.0–0.2)

## 2021-04-01 LAB — PROTIME-INR
INR: 0.9 (ref 0.8–1.2)
Prothrombin Time: 11.7 seconds (ref 11.4–15.2)

## 2021-04-01 LAB — BLOOD GAS, VENOUS
Acid-Base Excess: 3.6 mmol/L — ABNORMAL HIGH (ref 0.0–2.0)
Bicarbonate: 29.6 mmol/L — ABNORMAL HIGH (ref 20.0–28.0)
O2 Saturation: 74.8 %
Patient temperature: 98.6
pCO2, Ven: 54.4 mmHg (ref 44.0–60.0)
pH, Ven: 7.356 (ref 7.250–7.430)
pO2, Ven: 44.2 mmHg (ref 32.0–45.0)

## 2021-04-01 LAB — LACTIC ACID, PLASMA
Lactic Acid, Venous: 2 mmol/L (ref 0.5–1.9)
Lactic Acid, Venous: 1.3 mmol/L (ref 0.5–1.9)

## 2021-04-01 LAB — RESP PANEL BY RT-PCR (FLU A&B, COVID) ARPGX2
Influenza A by PCR: NEGATIVE
Influenza B by PCR: NEGATIVE
SARS Coronavirus 2 by RT PCR: NEGATIVE

## 2021-04-01 LAB — CBG MONITORING, ED: Glucose-Capillary: 117 mg/dL — ABNORMAL HIGH (ref 70–99)

## 2021-04-01 LAB — APTT: aPTT: 30 seconds (ref 24–36)

## 2021-04-01 IMAGING — CT CT HEAD W/O CM
3 of 4 series · 15 of 47 positions shown, 18 images · non-contrast
Comparison: Brain CT [DATE].

CLINICAL DATA: Un witnessed fall.

EXAM:
CT HEAD WITHOUT CONTRAST
CT CERVICAL SPINE WITHOUT CONTRAST
TECHNIQUE: Multidetector CT imaging of the head and cervical spine was
performed following the standard protocol without intravenous
contrast. Multiplanar CT image reconstructions of the cervical spine
were also generated.

[Series 1: thins · axial · 0.39mm/px · z∈[+1807,+1942]mm · 9 of 216 slices shown, 12 images]
[im 18/216  brain]
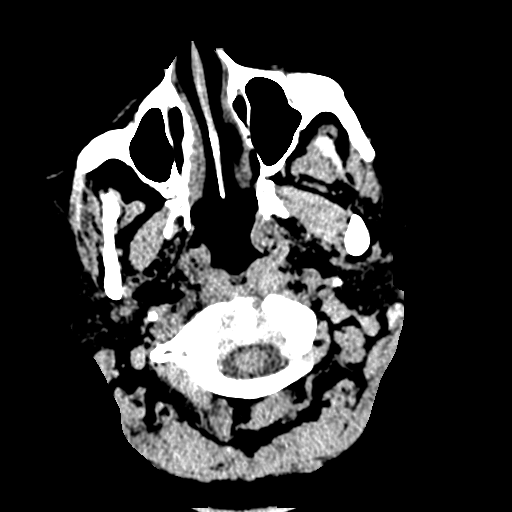
[im 18/216  bone]
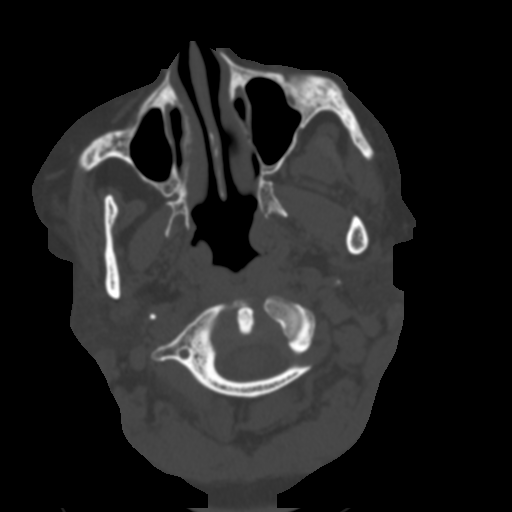
[im 44/216  brain]
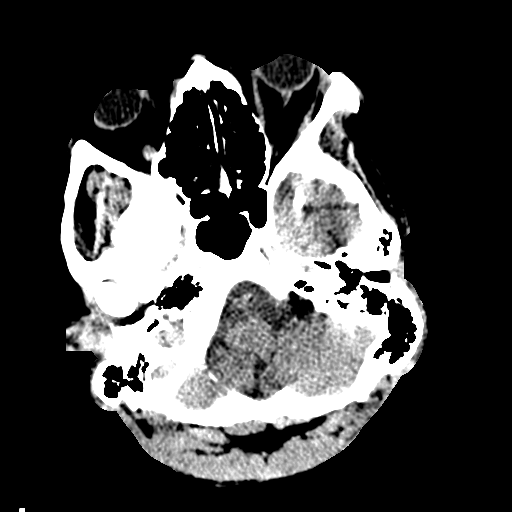
[im 61/216  brain]
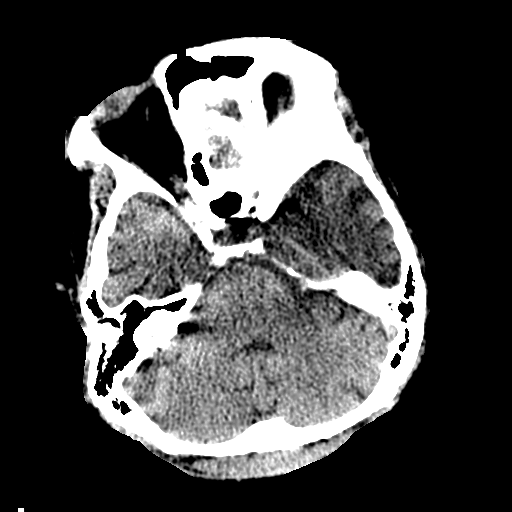
[im 87/216  brain]
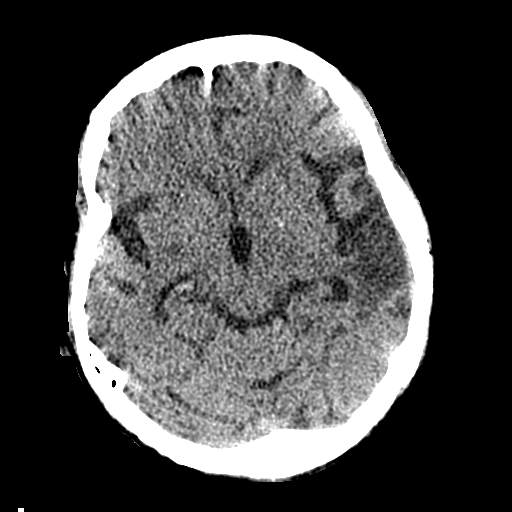
[im 112/216  brain]
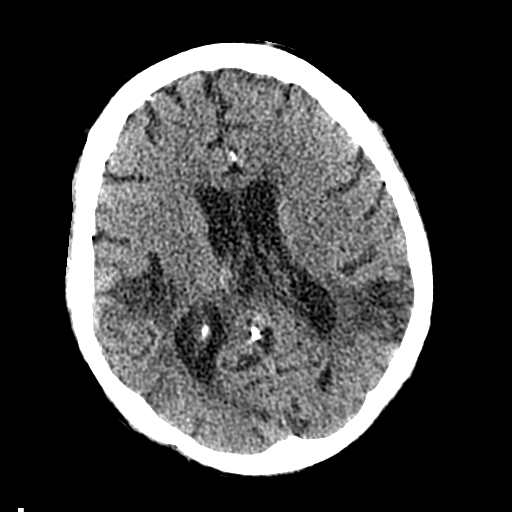
[im 112/216  bone]
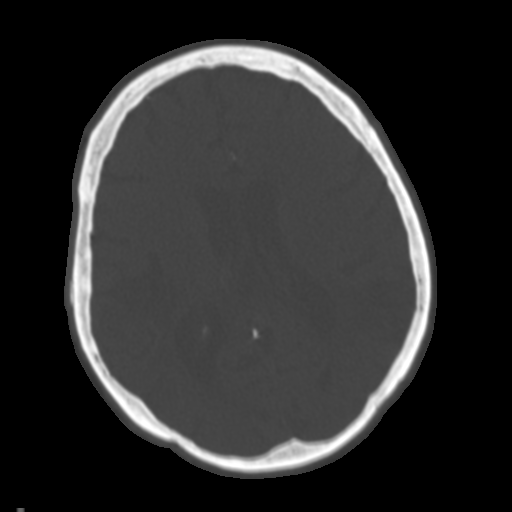
[im 130/216  brain]
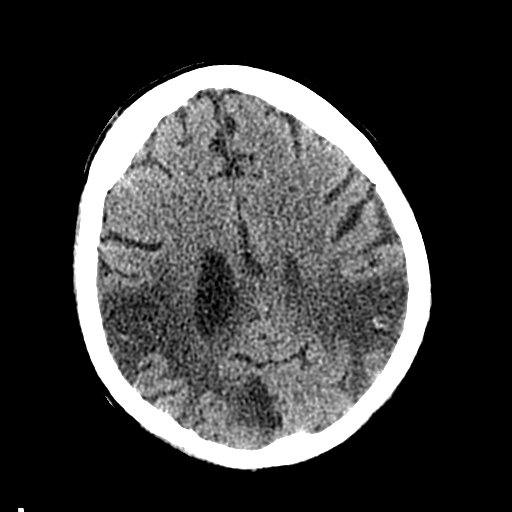
[im 155/216  brain]
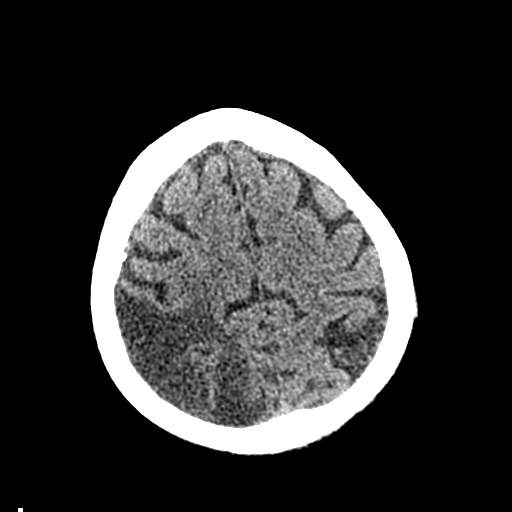
[im 181/216  brain]
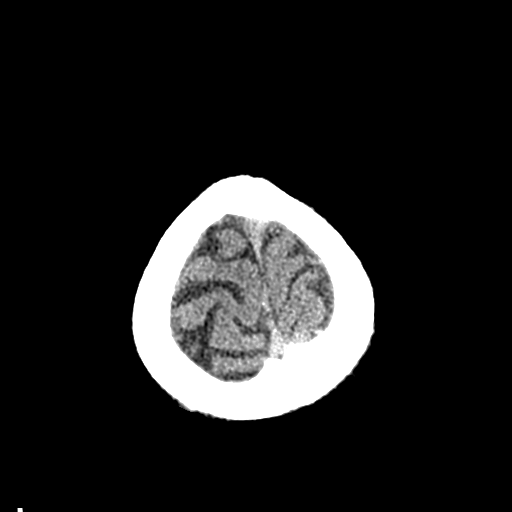
[im 198/216  brain]
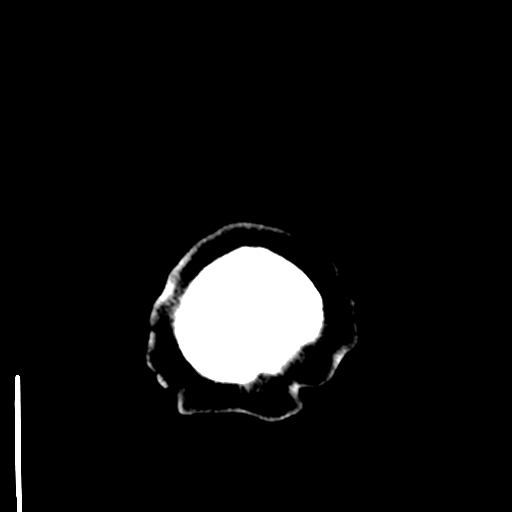
[im 198/216  bone]
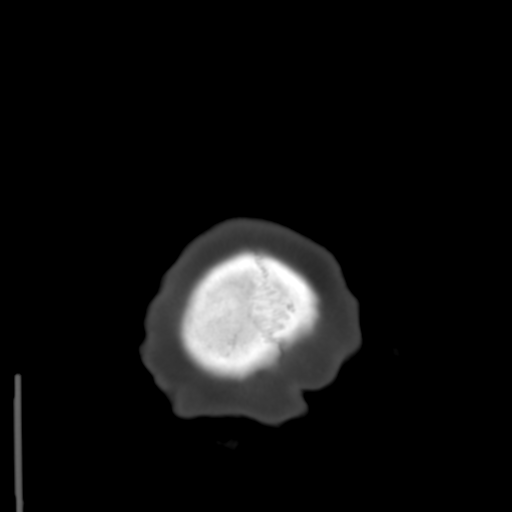

[Series 5: coronal soft tissue · coronal · 0.33mm/px · 3 of 68 slices shown]
[im 23/68  brain]
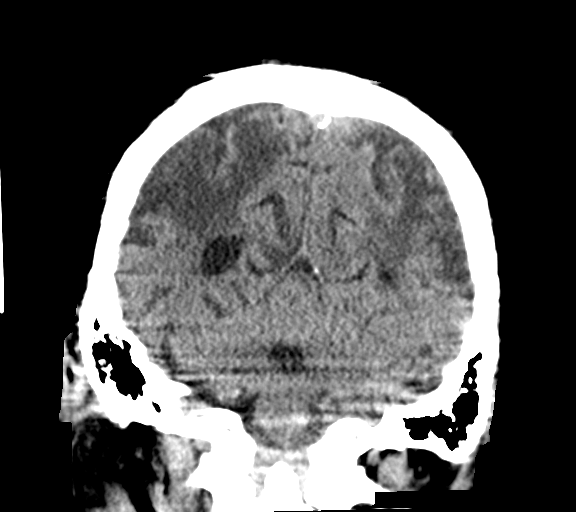
[im 30/68  brain]
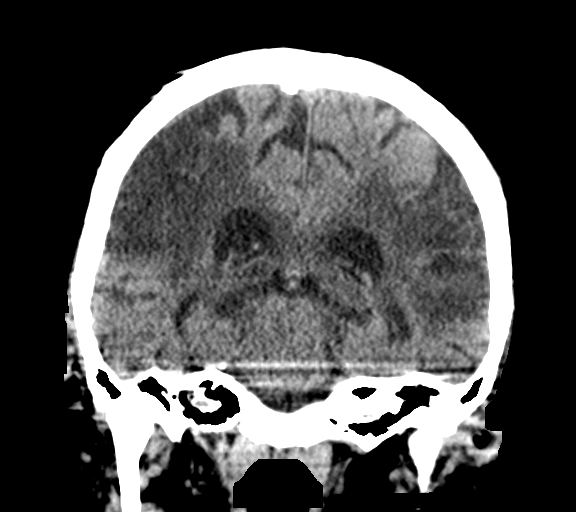
[im 38/68  brain]
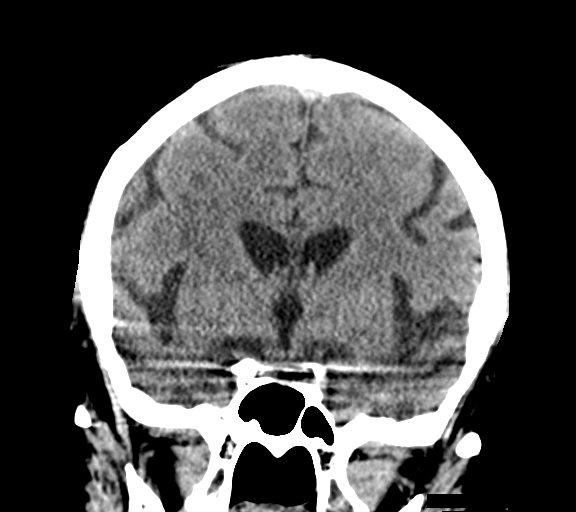

[Series 6: sagittal soft tissue · sagittal · 0.29mm/px · 3 of 59 slices shown]
[im 20/59  brain]
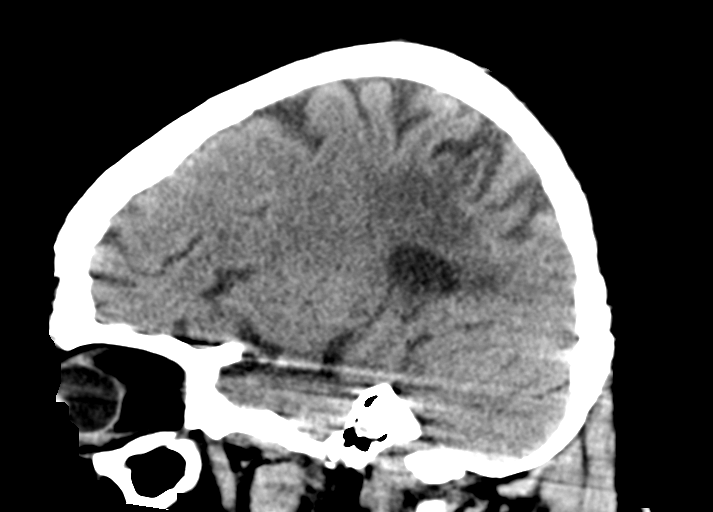
[im 30/59  brain]
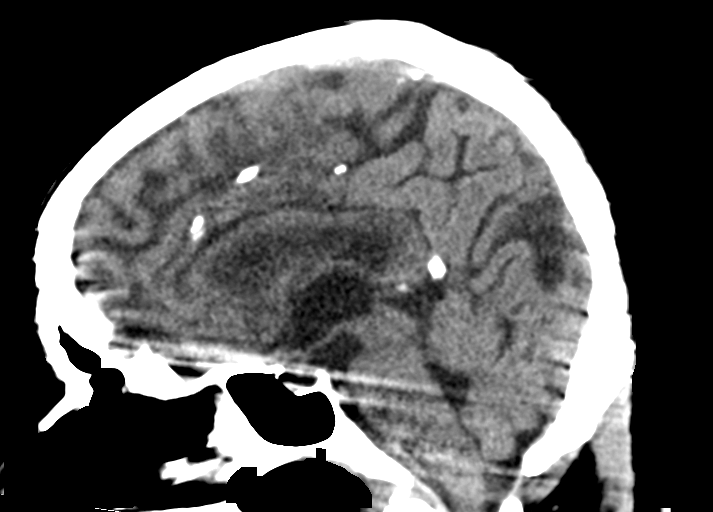
[im 39/59  brain]
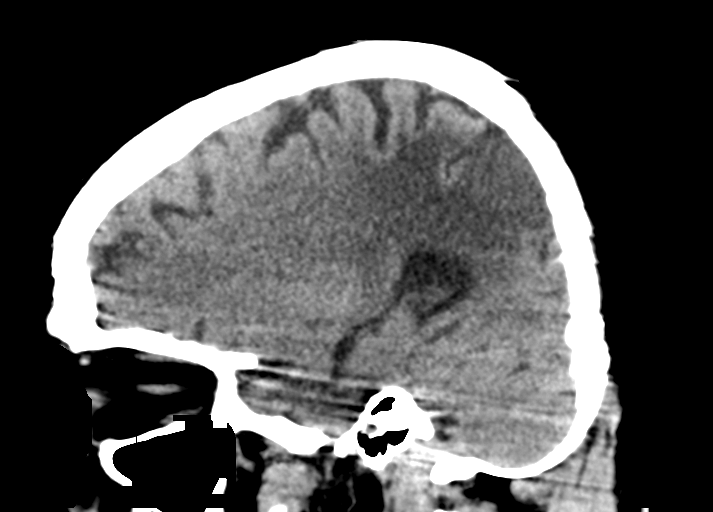

[15 of 47 positions shown; findings below may reference images not displayed]

FINDINGS: CT HEAD FINDINGS

Brain: Ventricles and sulci are appropriate for patient's age. No
evidence for acute intracranial hemorrhage, cortically based
infarct, mass lesion or mass-effect. Previously described bilateral
large cerebral hemisphere infarcts are redemonstrated. Unremarkable.

Vascular: Unremarkable

Skull: Intact.

Sinuses/Orbits: Paranasal sinuses and mastoid air cells are
unremarkable.

Other: None.

CT CERVICAL SPINE FINDINGS

Alignment: Unremarkable

Skull base and vertebrae: Intact.

Soft tissues and spinal canal: Unremarkable

Disc levels: No acute fracture. Relative preservation of the
vertebral body and intervertebral disc space heights.

Upper chest: There is a 2.0 cm nodule within the left thyroid lobe.

Other: None
IMPRESSION: No acute intracranial process.  Old bilateral cerebral infarcts.

No acute cervical spine fracture.

Thyroid nodule. Recommend further evaluation with thyroid
ultrasound.

## 2021-04-01 IMAGING — CT CT CERVICAL SPINE W/O CM
3 of 4 series · 12 of 33 positions shown, 14 images · non-contrast
Comparison: Brain CT [DATE].

CLINICAL DATA: Un witnessed fall.

EXAM:
CT HEAD WITHOUT CONTRAST
CT CERVICAL SPINE WITHOUT CONTRAST
TECHNIQUE: Multidetector CT imaging of the head and cervical spine was
performed following the standard protocol without intravenous
contrast. Multiplanar CT image reconstructions of the cervical spine
were also generated.

[Series 10: orthogonal bone · axial · 0.23mm/px · z∈[+1656,+1771]mm · 4 of 93 slices shown, 5 images]
[im 14/93  soft-tissue]
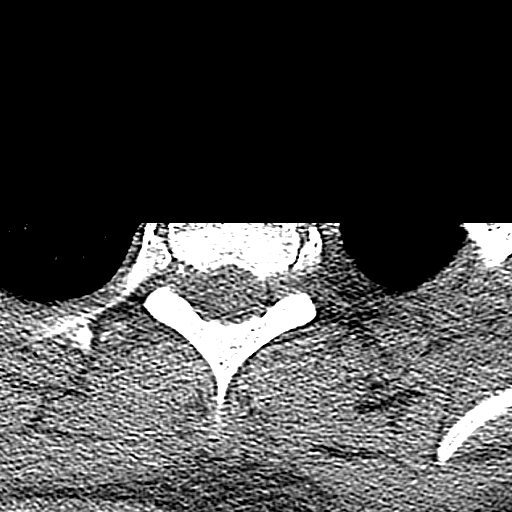
[im 14/93  bone]
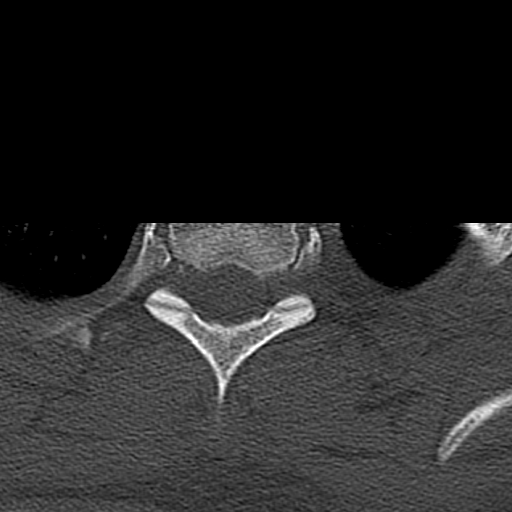
[im 40/93  bone]
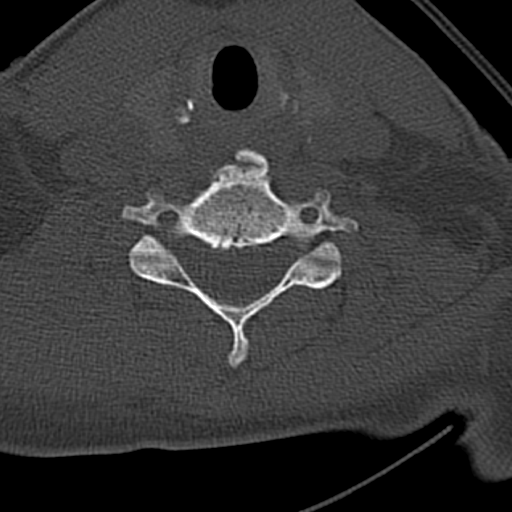
[im 53/93  bone]
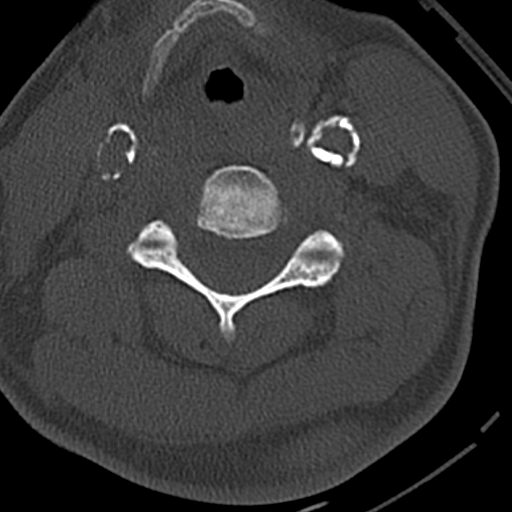
[im 79/93  bone]
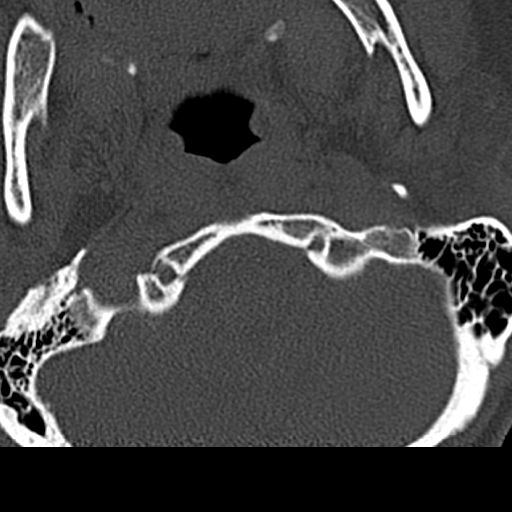

[Series 11: coronal bone · coronal · 0.23mm/px · 3 of 61 slices shown]
[im 16/61  bone]
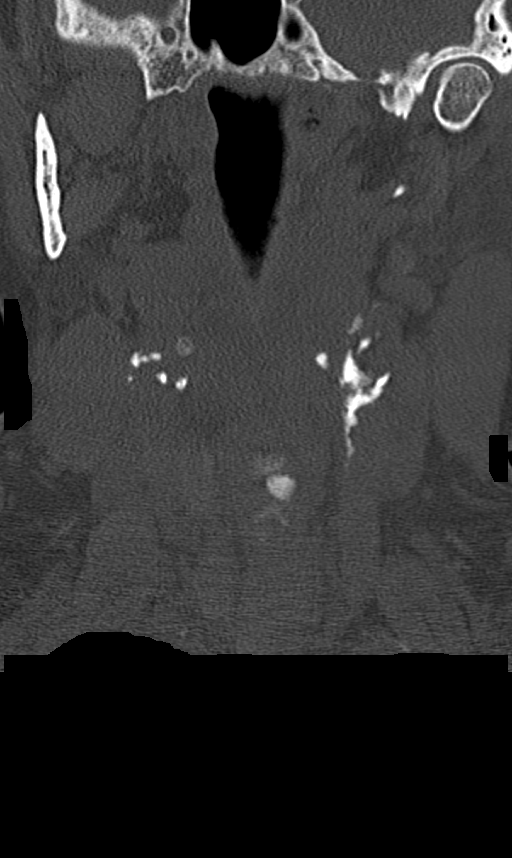
[im 26/61  bone]
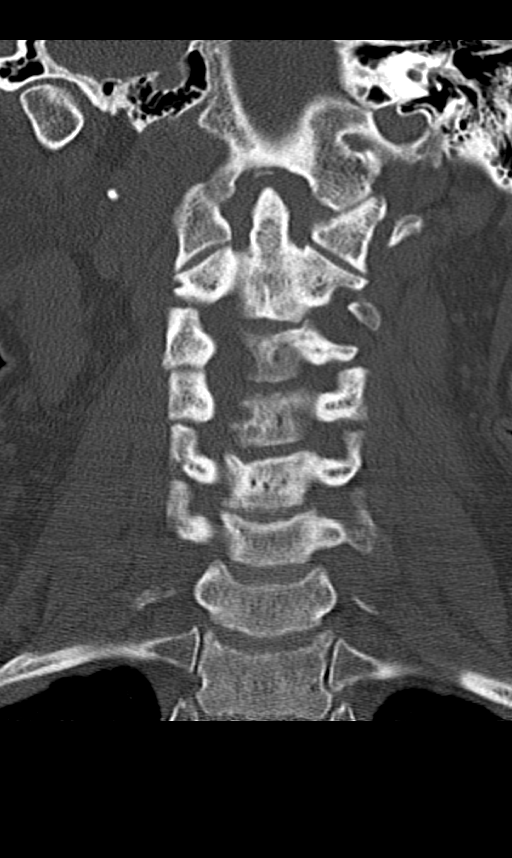
[im 36/61  bone]
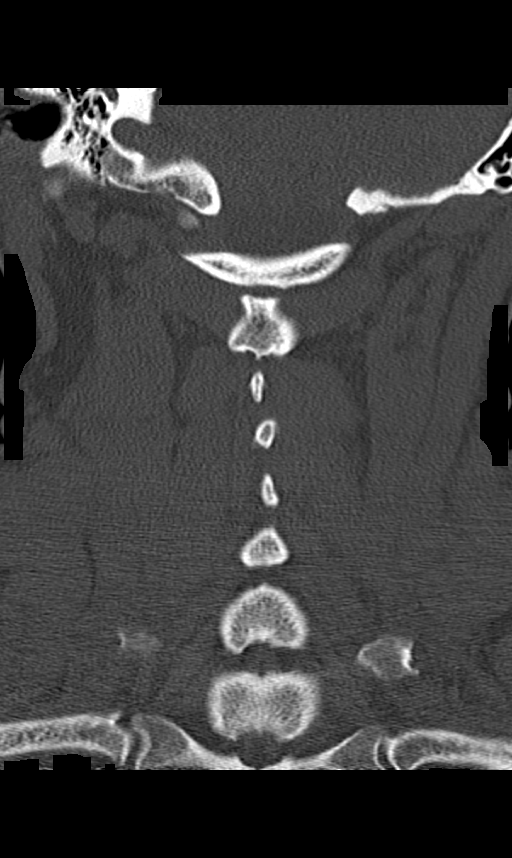

[Series 12: sagittal bone · sagittal · 0.26mm/px · 5 of 61 slices shown, 6 images]
[im 21/61  bone]
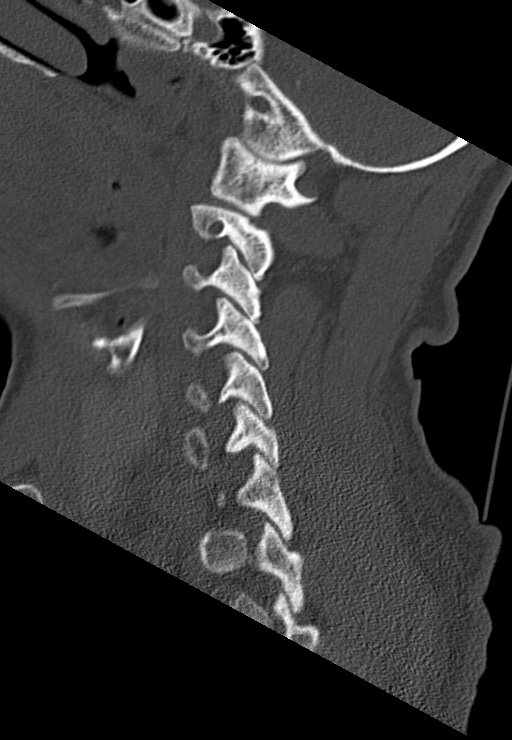
[im 26/61  bone]
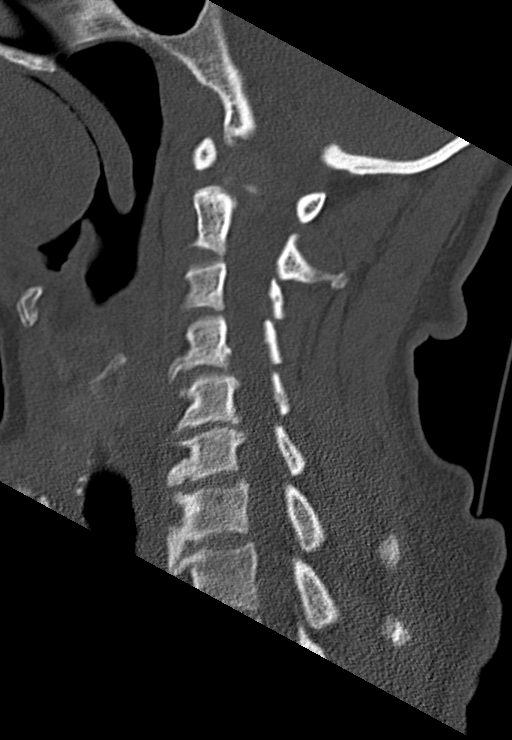
[im 31/61  soft-tissue]
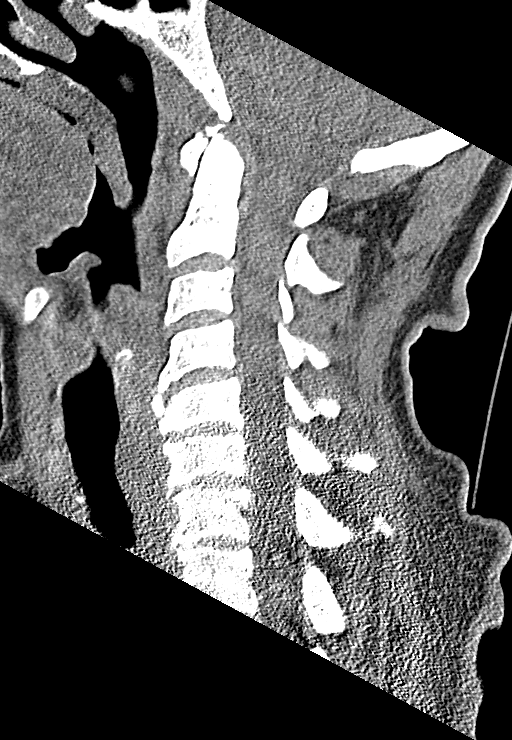
[im 31/61  bone]
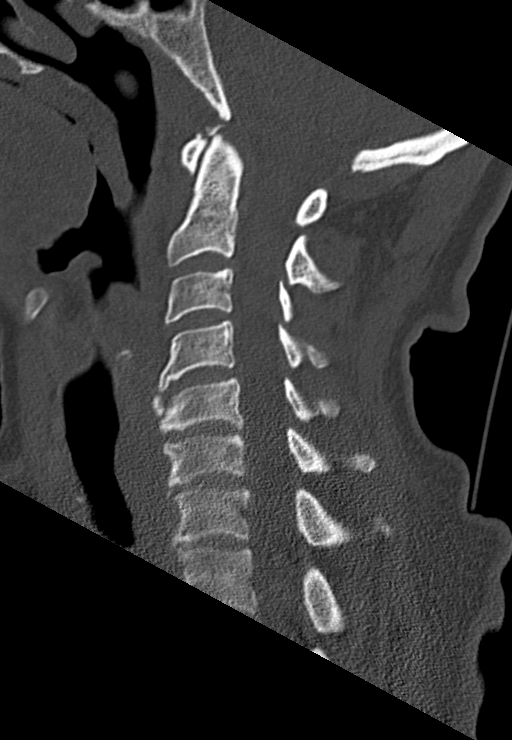
[im 36/61  bone]
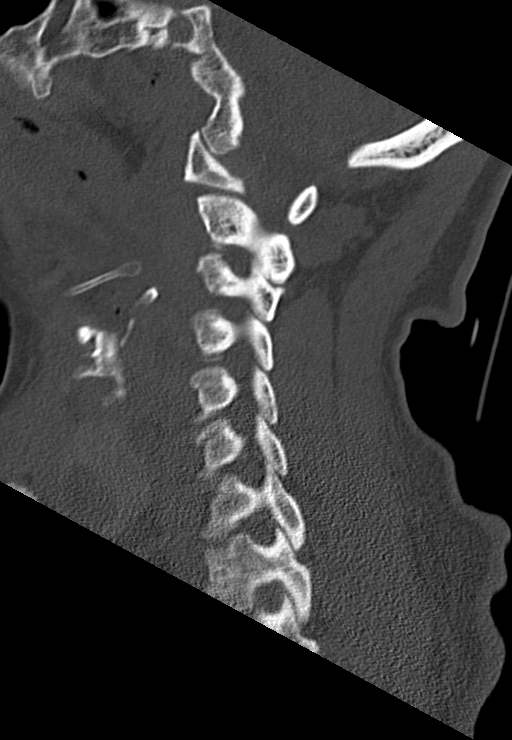
[im 41/61  bone]
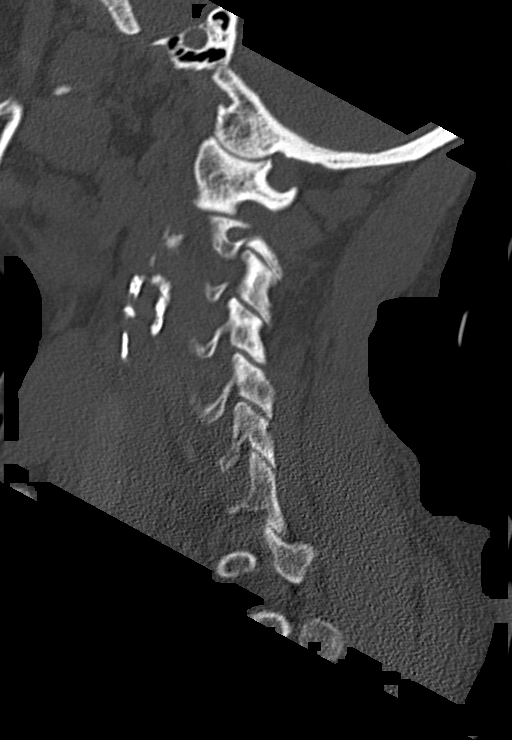

[12 of 33 positions shown; findings below may reference images not displayed]

FINDINGS: CT HEAD FINDINGS

Brain: Ventricles and sulci are appropriate for patient's age. No
evidence for acute intracranial hemorrhage, cortically based
infarct, mass lesion or mass-effect. Previously described bilateral
large cerebral hemisphere infarcts are redemonstrated. Unremarkable.

Vascular: Unremarkable

Skull: Intact.

Sinuses/Orbits: Paranasal sinuses and mastoid air cells are
unremarkable.

Other: None.

CT CERVICAL SPINE FINDINGS

Alignment: Unremarkable

Skull base and vertebrae: Intact.

Soft tissues and spinal canal: Unremarkable

Disc levels: No acute fracture. Relative preservation of the
vertebral body and intervertebral disc space heights.

Upper chest: There is a 2.0 cm nodule within the left thyroid lobe.

Other: None
IMPRESSION: No acute intracranial process.  Old bilateral cerebral infarcts.

No acute cervical spine fracture.

Thyroid nodule. Recommend further evaluation with thyroid
ultrasound.

## 2021-04-01 IMAGING — CR DG CHEST 1V PORT
1 series · 1 of 1 positions shown · non-contrast
Comparison: None.

CLINICAL DATA: Un witnessed fall.

EXAM:
PORTABLE CHEST 1 VIEW

[x chest ap]
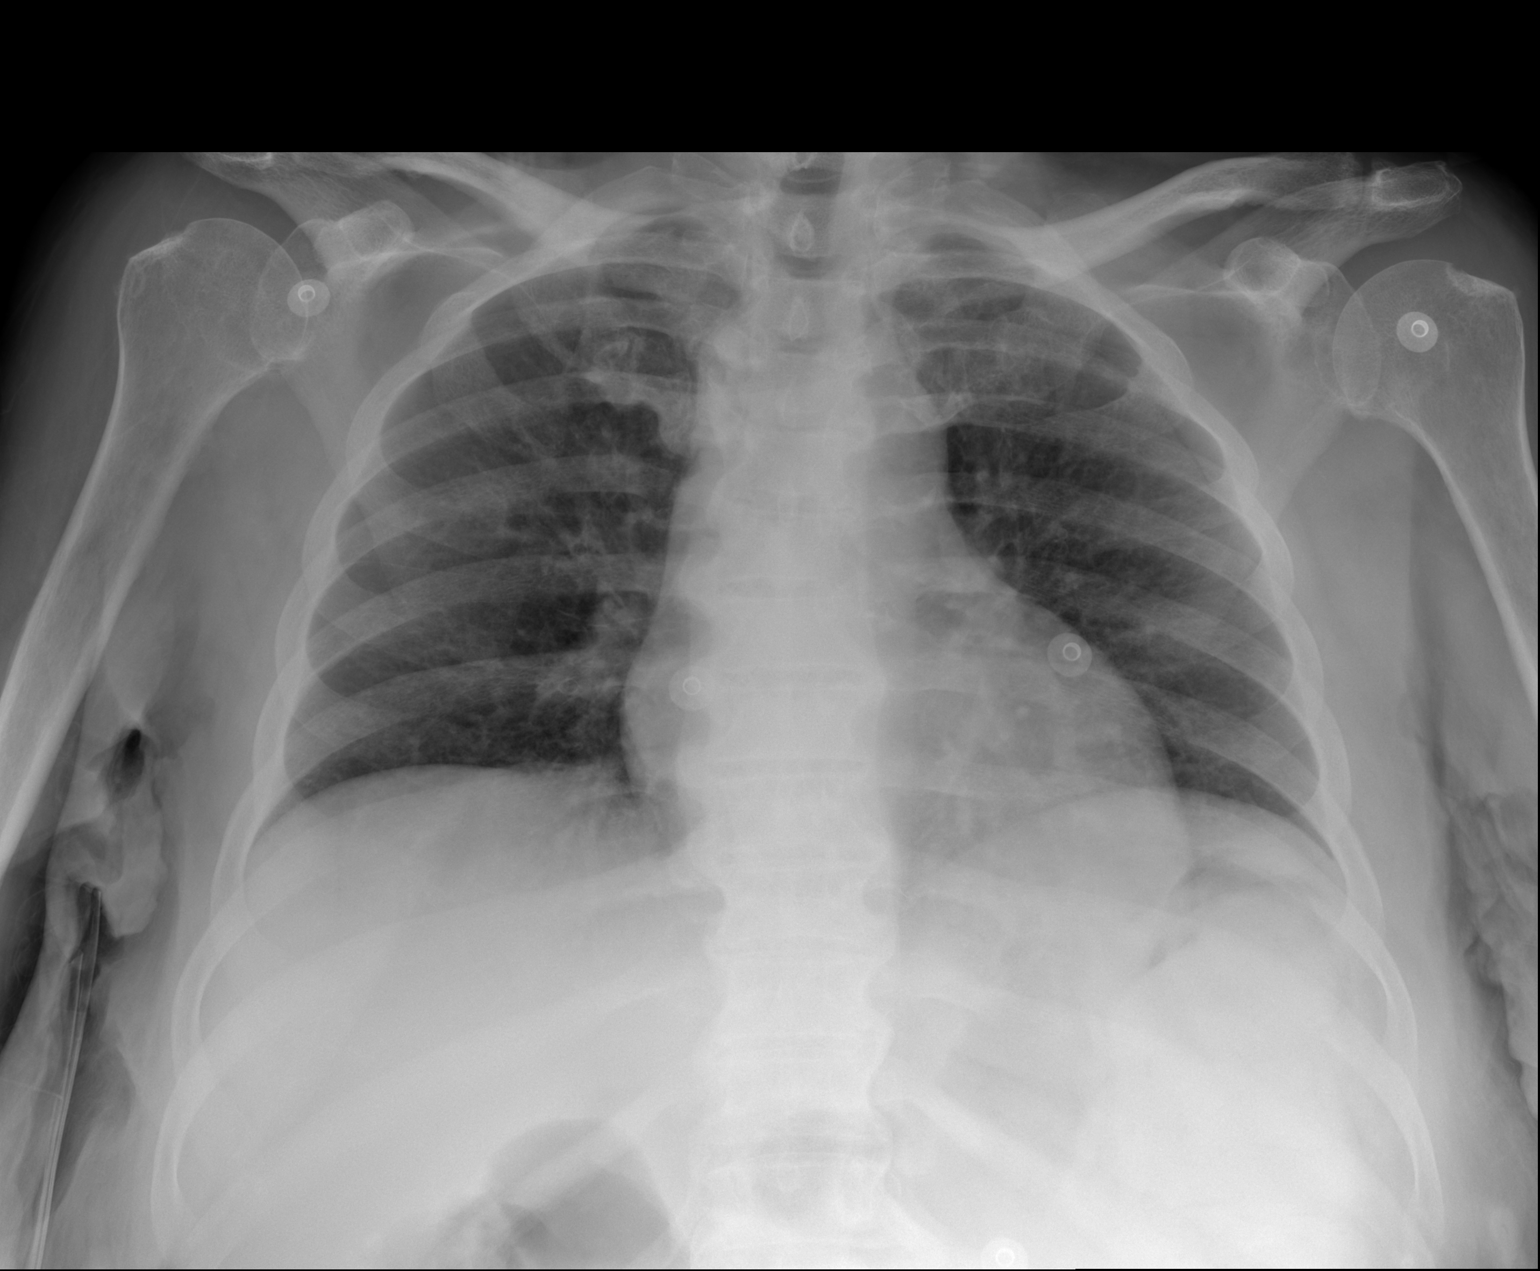

[1 of 1 positions shown; findings below may reference images not displayed]

FINDINGS: Stable cardiac and mediastinal contours. Low lung volumes. Patchy
bilateral airspace opacities. No pleural effusion or pneumothorax.
Osseous structures unremarkable.
IMPRESSION: Patchy bilateral airspace opacities likely represent atelectasis.
Infection not excluded.

## 2021-04-01 MED ORDER — LACTATED RINGERS IV BOLUS (SEPSIS)
1000.0000 mL | Freq: Once | INTRAVENOUS | Status: AC
  Administered 2021-04-01: 1000 mL via INTRAVENOUS

## 2021-04-01 NOTE — ED Triage Notes (Signed)
Pt BIB EMS. Pt coming from [facility] facility. Per staff pt had unwitnessed fall, was found in front of W/C. No LOC. Per staff pt blood pressure was low 84/60.   Per staff pt received '1000mg'$  Depakote, 64 phenobarbital and 0.'5mg'$  ativan daily.   Per EMS pt blood pressure increased in route. Last BP 124/70

## 2021-04-01 NOTE — ED Notes (Signed)
Updated daughter Thayer Headings on pt condition.

## 2021-04-01 NOTE — Discharge Instructions (Addendum)
Her work-up today was reassuring.  Please have her follow-up with her primary care doctor.

## 2021-04-01 NOTE — ED Provider Notes (Signed)
Care assumed from [facility], PA-C at shift change with labs pending .  In brief, this patient is a 60 y.o. F BIB EMS from [facility] who presents for evaluation of fall from wheel chair and AMS.  Per nursing home facility from previous provider, patient fell from her wheelchair and afterwards was slightly altered.  At baseline, patient cries and can sometimes talk.  They state no LOC and they found her immediately after she fell.  She was transferred to the ED for further evaluation.  On previous providers initial evaluation, she was somewhat somnolent but was starting to improve.  She did receive Ativan at the facility.   Physical Exam  BP (!) 146/89   Pulse 78   Temp 97.8 F (36.6 C) (Oral)   Resp 18   Ht '5\' 6"'$  (1.676 m)   Wt 81.6 kg   LMP  (LMP Unknown) Comment: neg hcg 07/20/20  SpO2 100%   BMI 29.05 kg/m   Physical Exam  Sitting up, talking.  Moving all extremities.  ED Course/Procedures   Clinical Course as of 04/01/21 1936  Sat Apr 01, 2021  1355 Discussed the patient's case with her nurse at her facility who states that the patient usually is able to navigate herself around the facility with her wheelchair.  She states she sometimes is sleepy secondary to her many medications, however she was speaking and Herself around the facility this morning.  The nurse states that she suddenly heard crying and found Maudene lying on the floor in front of her wheelchair.  She was crying and was still talking at that time.  She subsequently moved into her wheelchair where her vital signs were taken and she was found to be hypotensive.  According to the RN, this patient's mental status was still at her normal until EMS arrived. [RS]    Clinical Course User Index [RS] Emeline Darling, PA-C    Procedures  Results for orders placed or performed during the hospital encounter of 04/01/21 (from the past 24 hour(s))  Lactic acid, plasma     Status: Abnormal   Collection Time:  04/01/21  2:00 PM  Result Value Ref Range   Lactic Acid, Venous 2.0 (HH) 0.5 - 1.9 mmol/L  Comprehensive metabolic panel     Status: Abnormal   Collection Time: 04/01/21  2:00 PM  Result Value Ref Range   Sodium 144 135 - 145 mmol/L   Potassium 3.6 3.5 - 5.1 mmol/L   Chloride 103 98 - 111 mmol/L   CO2 30 22 - 32 mmol/L   Glucose, Bld 133 (H) 70 - 99 mg/dL   BUN 34 (H) 6 - 20 mg/dL   Creatinine, Ser 0.97 0.44 - 1.00 mg/dL   Calcium 9.4 8.9 - 10.3 mg/dL   Total Protein 6.6 6.5 - 8.1 g/dL   Albumin 3.8 3.5 - 5.0 g/dL   AST 20 15 - 41 U/L   ALT 20 0 - 44 U/L   Alkaline Phosphatase 92 38 - 126 U/L   Total Bilirubin 0.3 0.3 - 1.2 mg/dL   GFR, Estimated >60 >60 mL/min   Anion gap 11 5 - 15  CBC WITH DIFFERENTIAL     Status: Abnormal   Collection Time: 04/01/21  2:00 PM  Result Value Ref Range   WBC 6.6 4.0 - 10.5 K/uL   RBC 3.51 (L) 3.87 - 5.11 MIL/uL   Hemoglobin 11.8 (L) 12.0 - 15.0 g/dL   HCT 35.6 (L) 36.0 - 46.0 %  MCV 101.4 (H) 80.0 - 100.0 fL   MCH 33.6 26.0 - 34.0 pg   MCHC 33.1 30.0 - 36.0 g/dL   RDW 13.9 11.5 - 15.5 %   Platelets 179 150 - 400 K/uL   nRBC 0.0 0.0 - 0.2 %   Neutrophils Relative % 42 %   Neutro Abs 2.8 1.7 - 7.7 K/uL   Lymphocytes Relative 46 %   Lymphs Abs 3.1 0.7 - 4.0 K/uL   Monocytes Relative 7 %   Monocytes Absolute 0.5 0.1 - 1.0 K/uL   Eosinophils Relative 4 %   Eosinophils Absolute 0.2 0.0 - 0.5 K/uL   Basophils Relative 1 %   Basophils Absolute 0.0 0.0 - 0.1 K/uL   Immature Granulocytes 0 %   Abs Immature Granulocytes 0.01 0.00 - 0.07 K/uL  Protime-INR     Status: None   Collection Time: 04/01/21  2:00 PM  Result Value Ref Range   Prothrombin Time 11.7 11.4 - 15.2 seconds   INR 0.9 0.8 - 1.2  APTT     Status: None   Collection Time: 04/01/21  2:00 PM  Result Value Ref Range   aPTT 30 24 - 36 seconds  POC CBG, ED     Status: Abnormal   Collection Time: 04/01/21  2:27 PM  Result Value Ref Range   Glucose-Capillary 117 (H) 70 - 99  mg/dL  Resp Panel by RT-PCR (Flu A&B, Covid) Nasopharyngeal Swab     Status: None   Collection Time: 04/01/21  3:15 PM   Specimen: Nasopharyngeal Swab; Nasopharyngeal(NP) swabs in vial transport medium  Result Value Ref Range   SARS Coronavirus 2 by RT PCR NEGATIVE NEGATIVE   Influenza A by PCR NEGATIVE NEGATIVE   Influenza B by PCR NEGATIVE NEGATIVE  Urinalysis, Routine w reflex microscopic     Status: Abnormal   Collection Time: 04/01/21  5:26 PM  Result Value Ref Range   Color, Urine YELLOW YELLOW   APPearance CLEAR CLEAR   Specific Gravity, Urine 1.016 1.005 - 1.030   pH 5.0 5.0 - 8.0   Glucose, UA NEGATIVE NEGATIVE mg/dL   Hgb urine dipstick NEGATIVE NEGATIVE   Bilirubin Urine NEGATIVE NEGATIVE   Ketones, ur NEGATIVE NEGATIVE mg/dL   Protein, ur NEGATIVE NEGATIVE mg/dL   Nitrite NEGATIVE NEGATIVE   Leukocytes,Ua TRACE (A) NEGATIVE   WBC, UA 6-10 0 - 5 WBC/hpf   Bacteria, UA FEW (A) NONE SEEN   Squamous Epithelial / LPF 0-5 0 - 5   Budding Yeast PRESENT   Lactic acid, plasma     Status: None   Collection Time: 04/01/21  5:43 PM  Result Value Ref Range   Lactic Acid, Venous 1.3 0.5 - 1.9 mmol/L  Blood gas, venous (at [facility] and [facility], not at [facility])     Status: Abnormal   Collection Time: 04/01/21  5:43 PM  Result Value Ref Range   pH, Ven 7.356 7.250 - 7.430   pCO2, Ven 54.4 44.0 - 60.0 mmHg   pO2, Ven 44.2 32.0 - 45.0 mmHg   Bicarbonate 29.6 (H) 20.0 - 28.0 mmol/L   Acid-Base Excess 3.6 (H) 0.0 - 2.0 mmol/L   O2 Saturation 74.8 %   Patient temperature 98.6     MDM    PLAN: Patient has improved mentation and is back to baseline.  She is pending labs.  Per previous provider, patient will be discharged home if labs reassuring.  MDM:  VBG shows pH of 7.3.  UA shows trace  leukocytes.  Lactic is improved to 1.3 after fluids.  COVID is negative.  CMP shows normal BUN/creatinine.  CBC shows a leukocytosis.  Hemoglobin is 11.8.  Reevaluation.  Patient is sitting up on the bed.   She is alert, able to talk to me.  She can tell me her name.  She states she is ready to go home.  Patient stable for discharge at this time.  Previous provider thought that there was a degree of polypharmacy and that patient had may be taking too many medications.  She was given Ativan at the facility.  Per previous provider, patient was at back to her baseline.  Patient will be discharged home to [facility].     1. Fall, initial encounter       Desma Mcgregor 04/01/21 1936    Lucrezia Starch, MD 04/03/21 [id number]

## 2021-04-01 NOTE — ED Notes (Signed)
Called [facility] to transport pt back to [facility].
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
## 2021-04-01 NOTE — ED Notes (Signed)
Called and gave report to [facility].

## 2021-04-01 NOTE — ED Notes (Signed)
Delay on blood work due to IV infiltrating and pt being a difficult stick. IV team consulted.

## 2021-04-01 NOTE — ED Notes (Signed)
Pt is cleaned and placed in new depend.

## 2021-04-03 DIAGNOSIS — R293 Abnormal posture: Secondary | ICD-10-CM | POA: Diagnosis not present

## 2021-04-03 DIAGNOSIS — M6281 Muscle weakness (generalized): Secondary | ICD-10-CM | POA: Diagnosis not present

## 2021-04-03 DIAGNOSIS — R296 Repeated falls: Secondary | ICD-10-CM | POA: Diagnosis not present

## 2021-04-03 LAB — URINE CULTURE

## 2021-04-05 DIAGNOSIS — R296 Repeated falls: Secondary | ICD-10-CM | POA: Diagnosis not present

## 2021-04-05 DIAGNOSIS — R293 Abnormal posture: Secondary | ICD-10-CM | POA: Diagnosis not present

## 2021-04-05 DIAGNOSIS — M6281 Muscle weakness (generalized): Secondary | ICD-10-CM | POA: Diagnosis not present

## 2021-04-06 DIAGNOSIS — M6281 Muscle weakness (generalized): Secondary | ICD-10-CM | POA: Diagnosis not present

## 2021-04-06 DIAGNOSIS — R296 Repeated falls: Secondary | ICD-10-CM | POA: Diagnosis not present

## 2021-04-06 DIAGNOSIS — R293 Abnormal posture: Secondary | ICD-10-CM | POA: Diagnosis not present

## 2021-04-06 LAB — CULTURE, BLOOD (SINGLE)
Culture: NO GROWTH
Special Requests: ADEQUATE

## 2021-04-10 DIAGNOSIS — R293 Abnormal posture: Secondary | ICD-10-CM | POA: Diagnosis not present

## 2021-04-10 DIAGNOSIS — R296 Repeated falls: Secondary | ICD-10-CM | POA: Diagnosis not present

## 2021-04-10 DIAGNOSIS — M6281 Muscle weakness (generalized): Secondary | ICD-10-CM | POA: Diagnosis not present

## 2021-04-11 DIAGNOSIS — M6281 Muscle weakness (generalized): Secondary | ICD-10-CM | POA: Diagnosis not present

## 2021-04-11 DIAGNOSIS — R293 Abnormal posture: Secondary | ICD-10-CM | POA: Diagnosis not present

## 2021-04-11 DIAGNOSIS — R296 Repeated falls: Secondary | ICD-10-CM | POA: Diagnosis not present

## 2021-04-12 DIAGNOSIS — R296 Repeated falls: Secondary | ICD-10-CM | POA: Diagnosis not present

## 2021-04-12 DIAGNOSIS — R293 Abnormal posture: Secondary | ICD-10-CM | POA: Diagnosis not present

## 2021-04-12 DIAGNOSIS — M6281 Muscle weakness (generalized): Secondary | ICD-10-CM | POA: Diagnosis not present

## 2021-04-12 DIAGNOSIS — G9340 Encephalopathy, unspecified: Secondary | ICD-10-CM | POA: Diagnosis not present

## 2021-04-12 DIAGNOSIS — R1310 Dysphagia, unspecified: Secondary | ICD-10-CM | POA: Diagnosis not present

## 2021-04-12 DIAGNOSIS — E1165 Type 2 diabetes mellitus with hyperglycemia: Secondary | ICD-10-CM | POA: Diagnosis not present

## 2021-04-12 DIAGNOSIS — R569 Unspecified convulsions: Secondary | ICD-10-CM | POA: Diagnosis not present

## 2021-04-13 DIAGNOSIS — M6281 Muscle weakness (generalized): Secondary | ICD-10-CM | POA: Diagnosis not present

## 2021-04-13 DIAGNOSIS — R296 Repeated falls: Secondary | ICD-10-CM | POA: Diagnosis not present

## 2021-04-13 DIAGNOSIS — R293 Abnormal posture: Secondary | ICD-10-CM | POA: Diagnosis not present

## 2021-04-14 DIAGNOSIS — M6281 Muscle weakness (generalized): Secondary | ICD-10-CM | POA: Diagnosis not present

## 2021-04-14 DIAGNOSIS — R296 Repeated falls: Secondary | ICD-10-CM | POA: Diagnosis not present

## 2021-04-14 DIAGNOSIS — R293 Abnormal posture: Secondary | ICD-10-CM | POA: Diagnosis not present

## 2021-04-20 ENCOUNTER — Ambulatory Visit: Payer: Medicare Other | Admitting: Internal Medicine

## 2021-05-08 DIAGNOSIS — M24542 Contracture, left hand: Secondary | ICD-10-CM | POA: Diagnosis not present

## 2021-05-08 DIAGNOSIS — R296 Repeated falls: Secondary | ICD-10-CM | POA: Diagnosis not present

## 2021-05-09 DIAGNOSIS — R296 Repeated falls: Secondary | ICD-10-CM | POA: Diagnosis not present

## 2021-05-09 DIAGNOSIS — M24542 Contracture, left hand: Secondary | ICD-10-CM | POA: Diagnosis not present

## 2021-05-10 DIAGNOSIS — R296 Repeated falls: Secondary | ICD-10-CM | POA: Diagnosis not present

## 2021-05-10 DIAGNOSIS — Z79899 Other long term (current) drug therapy: Secondary | ICD-10-CM | POA: Diagnosis not present

## 2021-05-10 DIAGNOSIS — R799 Abnormal finding of blood chemistry, unspecified: Secondary | ICD-10-CM | POA: Diagnosis not present

## 2021-05-10 DIAGNOSIS — M24542 Contracture, left hand: Secondary | ICD-10-CM | POA: Diagnosis not present

## 2021-05-11 DIAGNOSIS — M24542 Contracture, left hand: Secondary | ICD-10-CM | POA: Diagnosis not present

## 2021-05-11 DIAGNOSIS — R296 Repeated falls: Secondary | ICD-10-CM | POA: Diagnosis not present

## 2021-05-12 DIAGNOSIS — M24542 Contracture, left hand: Secondary | ICD-10-CM | POA: Diagnosis not present

## 2021-05-12 DIAGNOSIS — R296 Repeated falls: Secondary | ICD-10-CM | POA: Diagnosis not present

## 2021-05-15 DIAGNOSIS — R296 Repeated falls: Secondary | ICD-10-CM | POA: Diagnosis not present

## 2021-05-15 DIAGNOSIS — M24542 Contracture, left hand: Secondary | ICD-10-CM | POA: Diagnosis not present

## 2021-05-16 DIAGNOSIS — M24542 Contracture, left hand: Secondary | ICD-10-CM | POA: Diagnosis not present

## 2021-05-16 DIAGNOSIS — R296 Repeated falls: Secondary | ICD-10-CM | POA: Diagnosis not present

## 2021-05-17 DIAGNOSIS — M24542 Contracture, left hand: Secondary | ICD-10-CM | POA: Diagnosis not present

## 2021-05-17 DIAGNOSIS — R296 Repeated falls: Secondary | ICD-10-CM | POA: Diagnosis not present

## 2021-05-18 DIAGNOSIS — M24542 Contracture, left hand: Secondary | ICD-10-CM | POA: Diagnosis not present

## 2021-05-18 DIAGNOSIS — R296 Repeated falls: Secondary | ICD-10-CM | POA: Diagnosis not present

## 2021-05-19 DIAGNOSIS — M24542 Contracture, left hand: Secondary | ICD-10-CM | POA: Diagnosis not present

## 2021-05-19 DIAGNOSIS — R296 Repeated falls: Secondary | ICD-10-CM | POA: Diagnosis not present

## 2021-05-22 DIAGNOSIS — R296 Repeated falls: Secondary | ICD-10-CM | POA: Diagnosis not present

## 2021-05-22 DIAGNOSIS — M24542 Contracture, left hand: Secondary | ICD-10-CM | POA: Diagnosis not present

## 2021-05-23 DIAGNOSIS — M24542 Contracture, left hand: Secondary | ICD-10-CM | POA: Diagnosis not present

## 2021-05-23 DIAGNOSIS — R296 Repeated falls: Secondary | ICD-10-CM | POA: Diagnosis not present

## 2021-05-24 DIAGNOSIS — R296 Repeated falls: Secondary | ICD-10-CM | POA: Diagnosis not present

## 2021-05-24 DIAGNOSIS — M24542 Contracture, left hand: Secondary | ICD-10-CM | POA: Diagnosis not present

## 2021-05-25 DIAGNOSIS — M24542 Contracture, left hand: Secondary | ICD-10-CM | POA: Diagnosis not present

## 2021-05-25 DIAGNOSIS — R296 Repeated falls: Secondary | ICD-10-CM | POA: Diagnosis not present

## 2021-05-26 DIAGNOSIS — M24542 Contracture, left hand: Secondary | ICD-10-CM | POA: Diagnosis not present

## 2021-05-26 DIAGNOSIS — R296 Repeated falls: Secondary | ICD-10-CM | POA: Diagnosis not present

## 2021-07-04 ENCOUNTER — Encounter: Payer: Self-pay | Admitting: Internal Medicine

## 2021-07-04 ENCOUNTER — Other Ambulatory Visit: Payer: Self-pay

## 2021-07-04 ENCOUNTER — Ambulatory Visit (INDEPENDENT_AMBULATORY_CARE_PROVIDER_SITE_OTHER): Payer: Medicare Other | Admitting: Internal Medicine

## 2021-07-04 DIAGNOSIS — B20 Human immunodeficiency virus [HIV] disease: Secondary | ICD-10-CM

## 2021-07-04 DIAGNOSIS — Z8673 Personal history of transient ischemic attack (TIA), and cerebral infarction without residual deficits: Secondary | ICD-10-CM

## 2021-07-04 DIAGNOSIS — Z113 Encounter for screening for infections with a predominantly sexual mode of transmission: Secondary | ICD-10-CM | POA: Diagnosis not present

## 2021-07-04 MED ORDER — ABACAVIR-DOLUTEGRAVIR-LAMIVUD 600-50-300 MG PO TABS: 1.0000 | ORAL_TABLET | Freq: Every morning | ORAL | 11 refills | Status: DC

## 2021-07-04 MED ORDER — DOLUTEGRAVIR SODIUM 50 MG PO TABS: 50.0000 mg | ORAL_TABLET | Freq: Every day | ORAL | 11 refills | Status: DC

## 2021-07-04 NOTE — Assessment & Plan Note (Signed)
Will screen today with RPR

## 2021-07-04 NOTE — Assessment & Plan Note (Signed)
Remains under care at the [facility] and wheelchair bound.  No walking.  Will continue to monitor

## 2021-07-04 NOTE — Progress Notes (Signed)
   Subjective:    Patient ID: Susan [name], female    DOB: August 06, 1961, 60 y.o.   MRN: [id number]  HPI Here for follow up of HIV She continues to reside in a NH and was last seen by me in June 2021.  Last CD4 count one year ago was 915 and viral load < 20.  She ocntinues on Triumeq and additiional Tivicay since she is on phenobarbital.  She answers questions with head nod and no complaints expressed.  Here with a caregiver from the facility.     Review of Systems  Constitutional:  Negative for fatigue.  Gastrointestinal:  Negative for diarrhea and nausea.  Skin:  Negative for rash.      Objective:   Physical Exam Constitutional:      Comments: In wheelchair  Eyes:     General: No scleral icterus. Pulmonary:     Effort: Pulmonary effort is normal.  Skin:    Findings: No rash.   SH: resides in a NH       Assessment & Plan:

## 2021-07-04 NOTE — Assessment & Plan Note (Signed)
No issues noted to her regimen with Triumeq and additional tivicay and will continue.  Labs today and she will rtc in 6 months.

## 2021-07-04 NOTE — Addendum Note (Signed)
Addended by: Caffie Pinto on: 07/04/2021 11:37 AM   Modules accepted: Orders

## 2021-07-05 LAB — COMPLETE METABOLIC PANEL WITH GFR
AG Ratio: 1.4 (calc) (ref 1.0–2.5)
ALT: 25 U/L (ref 6–29)
AST: 27 U/L (ref 10–35)
Albumin: 3.6 g/dL (ref 3.6–5.1)
Alkaline phosphatase (APISO): 103 U/L (ref 37–153)
BUN/Creatinine Ratio: 30 (calc) — ABNORMAL HIGH (ref 6–22)
BUN: 29 mg/dL — ABNORMAL HIGH (ref 7–25)
CO2: 26 mmol/L (ref 20–32)
Calcium: 9.5 mg/dL (ref 8.6–10.4)
Chloride: 101 mmol/L (ref 98–110)
Creat: 0.98 mg/dL (ref 0.50–1.03)
Globulin: 2.6 g/dL (calc) (ref 1.9–3.7)
Glucose, Bld: 99 mg/dL (ref 65–99)
Potassium: 4.2 mmol/L (ref 3.5–5.3)
Sodium: 140 mmol/L (ref 135–146)
Total Bilirubin: 0.2 mg/dL (ref 0.2–1.2)
Total Protein: 6.2 g/dL (ref 6.1–8.1)
eGFR: 66 mL/min/{1.73_m2} (ref >=60)

## 2021-07-05 LAB — CBC WITH DIFFERENTIAL/PLATELET
Absolute Monocytes: 754 cells/uL (ref 200–950)
Basophils Absolute: 37 cells/uL (ref 0–200)
Basophils Relative: 0.4 %
Eosinophils Absolute: 248 cells/uL (ref 15–500)
Eosinophils Relative: 2.7 %
HCT: 32.6 % — ABNORMAL LOW (ref 35.0–45.0)
Hemoglobin: 11 g/dL — ABNORMAL LOW (ref 11.7–15.5)
Lymphs Abs: 5198 cells/uL — ABNORMAL HIGH (ref 850–3900)
MCH: 33 pg (ref 27.0–33.0)
MCHC: 33.7 g/dL (ref 32.0–36.0)
MCV: 97.9 fL (ref 80.0–100.0)
MPV: 9.2 fL (ref 7.5–12.5)
Monocytes Relative: 8.2 %
Neutro Abs: 2962 cells/uL (ref 1500–7800)
Neutrophils Relative %: 32.2 %
Platelets: 280 10*3/uL (ref 140–400)
RBC: 3.33 10*6/uL — ABNORMAL LOW (ref 3.80–5.10)
RDW: 13.9 % (ref 11.0–15.0)
Total Lymphocyte: 56.5 %
WBC: 9.2 10*3/uL (ref 3.8–10.8)

## 2021-07-05 LAB — T-HELPER CELL (CD4) - (RCID CLINIC ONLY)
CD4 % Helper T Cell: 30 % — ABNORMAL LOW (ref 33–65)
CD4 T Cell Abs: 1505 /uL (ref 400–1790)

## 2021-07-05 LAB — RPR: RPR Ser Ql: NONREACTIVE

## 2022-01-01 DIAGNOSIS — R278 Other lack of coordination: Secondary | ICD-10-CM | POA: Diagnosis not present

## 2022-01-02 DIAGNOSIS — R278 Other lack of coordination: Secondary | ICD-10-CM | POA: Diagnosis not present

## 2022-01-03 DIAGNOSIS — R278 Other lack of coordination: Secondary | ICD-10-CM | POA: Diagnosis not present

## 2022-01-04 DIAGNOSIS — R278 Other lack of coordination: Secondary | ICD-10-CM | POA: Diagnosis not present

## 2022-01-05 DIAGNOSIS — R278 Other lack of coordination: Secondary | ICD-10-CM | POA: Diagnosis not present

## 2022-01-08 DIAGNOSIS — J449 Chronic obstructive pulmonary disease, unspecified: Secondary | ICD-10-CM | POA: Diagnosis not present

## 2022-01-08 DIAGNOSIS — Z79899 Other long term (current) drug therapy: Secondary | ICD-10-CM | POA: Diagnosis not present

## 2022-01-08 DIAGNOSIS — E1165 Type 2 diabetes mellitus with hyperglycemia: Secondary | ICD-10-CM | POA: Diagnosis not present

## 2022-01-08 DIAGNOSIS — R278 Other lack of coordination: Secondary | ICD-10-CM | POA: Diagnosis not present

## 2022-01-12 DIAGNOSIS — F17200 Nicotine dependence, unspecified, uncomplicated: Secondary | ICD-10-CM | POA: Diagnosis not present

## 2022-01-19 ENCOUNTER — Other Ambulatory Visit: Payer: Self-pay

## 2022-01-19 ENCOUNTER — Telehealth: Payer: Self-pay

## 2022-01-19 ENCOUNTER — Ambulatory Visit (INDEPENDENT_AMBULATORY_CARE_PROVIDER_SITE_OTHER): Payer: Medicare Other | Admitting: Internal Medicine

## 2022-01-19 ENCOUNTER — Encounter: Payer: Self-pay | Admitting: Internal Medicine

## 2022-01-19 VITALS — BP 131/83 | HR 73 | Temp 97.5°F | Ht 63.0 in

## 2022-01-19 DIAGNOSIS — B20 Human immunodeficiency virus [HIV] disease: Secondary | ICD-10-CM

## 2022-01-19 DIAGNOSIS — Z8673 Personal history of transient ischemic attack (TIA), and cerebral infarction without residual deficits: Secondary | ICD-10-CM | POA: Diagnosis not present

## 2022-01-19 DIAGNOSIS — Z5181 Encounter for therapeutic drug level monitoring: Secondary | ICD-10-CM

## 2022-01-19 NOTE — Telephone Encounter (Addendum)
Requested RN to fax patient's current medication list; list provided at visit appointment was partially cut off and unreadable. Fax number given.  Fax received this afternoon and medication names/instructions still cut off.  Binnie Kand, RN

## 2022-01-19 NOTE — Progress Notes (Signed)
   Subjective:    Patient ID: Susan [name], female    DOB: Mar 18, 1961, 61 y.o.   MRN: [id number]  HPI Here for follow up of HIV She continues on Triumeq and nightly tivicay while on phenobarbital and no issues with the medication.  She continues to reside in a NH following her stroke and improving.  She is walking some and verbalizing more now.  She has no complaints today.     Review of Systems  Constitutional:  Negative for fatigue.  Gastrointestinal:  Negative for diarrhea and nausea.  Skin:  Negative for rash.      Objective:   Physical Exam Eyes:     General: No scleral icterus. Skin:    Findings: No rash.  Neurological:     Mental Status: She is alert.  Psychiatric:        Mood and Affect: Mood normal.   SH: lives in [place]:

## 2022-01-19 NOTE — Assessment & Plan Note (Signed)
She continues to do well with no new concerns.  She will remain on the triumeq and tivicay at night.  No changes indicated and will do labs today.  Follow up in 6 months.

## 2022-01-19 NOTE — Assessment & Plan Note (Signed)
She is on phenobarbital so she continues on an extra dose of Tivicay due to drug-drug interactions.  Will continue with this.

## 2022-01-19 NOTE — Telephone Encounter (Signed)
[place] and requested patient's nurse; was transferred to  [facility]. Notified her that Dr. Henreitta Leber visit orders were enclosed in envelope brought by patient/caregiver, and returned to caregiver. Informed Makayla that per orders, patient would be continuing Triumeq and Tivicay with a follow up in 6 months. Stated understanding and had no additional questions.  Binnie Kand, RN

## 2022-01-19 NOTE — Assessment & Plan Note (Signed)
She continues to improve overall with her speech, and walking by her report.  She will continue in the NH.

## 2022-01-23 LAB — T-HELPER CELLS (CD4) COUNT (NOT AT ARMC)
Absolute CD4: 701 cells/uL (ref 490–1740)
CD4 T Helper %: 29 % — ABNORMAL LOW (ref 30–61)
Total lymphocyte count: 2378 cells/uL (ref 850–3900)

## 2022-01-23 LAB — HIV-1 RNA QUANT-NO REFLEX-BLD
HIV 1 RNA Quant: <20 copies/mL — AB
HIV-1 RNA Quant, Log: <1.3 Log copies/mL — AB

## 2022-01-24 DIAGNOSIS — M1990 Unspecified osteoarthritis, unspecified site: Secondary | ICD-10-CM | POA: Diagnosis not present

## 2022-01-24 DIAGNOSIS — R278 Other lack of coordination: Secondary | ICD-10-CM | POA: Diagnosis not present

## 2022-01-25 DIAGNOSIS — M1990 Unspecified osteoarthritis, unspecified site: Secondary | ICD-10-CM | POA: Diagnosis not present

## 2022-01-25 DIAGNOSIS — R278 Other lack of coordination: Secondary | ICD-10-CM | POA: Diagnosis not present

## 2022-01-26 DIAGNOSIS — F17200 Nicotine dependence, unspecified, uncomplicated: Secondary | ICD-10-CM | POA: Diagnosis not present

## 2022-01-26 DIAGNOSIS — R278 Other lack of coordination: Secondary | ICD-10-CM | POA: Diagnosis not present

## 2022-01-26 DIAGNOSIS — M1990 Unspecified osteoarthritis, unspecified site: Secondary | ICD-10-CM | POA: Diagnosis not present

## 2022-01-29 DIAGNOSIS — M1990 Unspecified osteoarthritis, unspecified site: Secondary | ICD-10-CM | POA: Diagnosis not present

## 2022-01-29 DIAGNOSIS — R278 Other lack of coordination: Secondary | ICD-10-CM | POA: Diagnosis not present

## 2022-01-30 DIAGNOSIS — I1310 Hypertensive heart and chronic kidney disease without heart failure, with stage 1 through stage 4 chronic kidney disease, or unspecified chronic kidney disease: Secondary | ICD-10-CM | POA: Diagnosis not present

## 2022-01-30 DIAGNOSIS — E785 Hyperlipidemia, unspecified: Secondary | ICD-10-CM | POA: Diagnosis not present

## 2022-01-30 DIAGNOSIS — E1140 Type 2 diabetes mellitus with diabetic neuropathy, unspecified: Secondary | ICD-10-CM | POA: Diagnosis not present

## 2022-01-30 DIAGNOSIS — M1990 Unspecified osteoarthritis, unspecified site: Secondary | ICD-10-CM | POA: Diagnosis not present

## 2022-01-30 DIAGNOSIS — R278 Other lack of coordination: Secondary | ICD-10-CM | POA: Diagnosis not present

## 2022-01-31 DIAGNOSIS — M1990 Unspecified osteoarthritis, unspecified site: Secondary | ICD-10-CM | POA: Diagnosis not present

## 2022-01-31 DIAGNOSIS — R278 Other lack of coordination: Secondary | ICD-10-CM | POA: Diagnosis not present

## 2022-01-31 DIAGNOSIS — E1165 Type 2 diabetes mellitus with hyperglycemia: Secondary | ICD-10-CM | POA: Diagnosis not present

## 2022-02-01 DIAGNOSIS — R278 Other lack of coordination: Secondary | ICD-10-CM | POA: Diagnosis not present

## 2022-02-01 DIAGNOSIS — M1990 Unspecified osteoarthritis, unspecified site: Secondary | ICD-10-CM | POA: Diagnosis not present

## 2022-02-02 DIAGNOSIS — M1990 Unspecified osteoarthritis, unspecified site: Secondary | ICD-10-CM | POA: Diagnosis not present

## 2022-02-02 DIAGNOSIS — R278 Other lack of coordination: Secondary | ICD-10-CM | POA: Diagnosis not present

## 2022-02-05 DIAGNOSIS — R278 Other lack of coordination: Secondary | ICD-10-CM | POA: Diagnosis not present

## 2022-02-05 DIAGNOSIS — R296 Repeated falls: Secondary | ICD-10-CM | POA: Diagnosis not present

## 2022-02-05 DIAGNOSIS — M1990 Unspecified osteoarthritis, unspecified site: Secondary | ICD-10-CM | POA: Diagnosis not present

## 2022-02-07 DIAGNOSIS — R278 Other lack of coordination: Secondary | ICD-10-CM | POA: Diagnosis not present

## 2022-02-07 DIAGNOSIS — M1990 Unspecified osteoarthritis, unspecified site: Secondary | ICD-10-CM | POA: Diagnosis not present

## 2022-02-08 DIAGNOSIS — M1990 Unspecified osteoarthritis, unspecified site: Secondary | ICD-10-CM | POA: Diagnosis not present

## 2022-02-08 DIAGNOSIS — R278 Other lack of coordination: Secondary | ICD-10-CM | POA: Diagnosis not present

## 2022-02-09 DIAGNOSIS — R278 Other lack of coordination: Secondary | ICD-10-CM | POA: Diagnosis not present

## 2022-02-09 DIAGNOSIS — M1990 Unspecified osteoarthritis, unspecified site: Secondary | ICD-10-CM | POA: Diagnosis not present

## 2022-02-12 DIAGNOSIS — M1990 Unspecified osteoarthritis, unspecified site: Secondary | ICD-10-CM | POA: Diagnosis not present

## 2022-02-12 DIAGNOSIS — R278 Other lack of coordination: Secondary | ICD-10-CM | POA: Diagnosis not present

## 2022-02-13 DIAGNOSIS — R278 Other lack of coordination: Secondary | ICD-10-CM | POA: Diagnosis not present

## 2022-02-13 DIAGNOSIS — M1990 Unspecified osteoarthritis, unspecified site: Secondary | ICD-10-CM | POA: Diagnosis not present

## 2022-02-14 DIAGNOSIS — R278 Other lack of coordination: Secondary | ICD-10-CM | POA: Diagnosis not present

## 2022-02-14 DIAGNOSIS — M1990 Unspecified osteoarthritis, unspecified site: Secondary | ICD-10-CM | POA: Diagnosis not present

## 2022-02-15 DIAGNOSIS — R278 Other lack of coordination: Secondary | ICD-10-CM | POA: Diagnosis not present

## 2022-02-15 DIAGNOSIS — M1990 Unspecified osteoarthritis, unspecified site: Secondary | ICD-10-CM | POA: Diagnosis not present

## 2022-02-18 DIAGNOSIS — M1990 Unspecified osteoarthritis, unspecified site: Secondary | ICD-10-CM | POA: Diagnosis not present

## 2022-02-18 DIAGNOSIS — R278 Other lack of coordination: Secondary | ICD-10-CM | POA: Diagnosis not present

## 2022-02-19 DIAGNOSIS — M1990 Unspecified osteoarthritis, unspecified site: Secondary | ICD-10-CM | POA: Diagnosis not present

## 2022-02-19 DIAGNOSIS — R278 Other lack of coordination: Secondary | ICD-10-CM | POA: Diagnosis not present

## 2022-03-15 DIAGNOSIS — E119 Type 2 diabetes mellitus without complications: Secondary | ICD-10-CM | POA: Diagnosis not present

## 2022-03-16 DIAGNOSIS — B351 Tinea unguium: Secondary | ICD-10-CM | POA: Diagnosis not present

## 2022-03-16 DIAGNOSIS — L600 Ingrowing nail: Secondary | ICD-10-CM | POA: Diagnosis not present

## 2022-03-16 DIAGNOSIS — I739 Peripheral vascular disease, unspecified: Secondary | ICD-10-CM | POA: Diagnosis not present

## 2022-03-20 DIAGNOSIS — R2681 Unsteadiness on feet: Secondary | ICD-10-CM | POA: Diagnosis not present

## 2022-03-20 DIAGNOSIS — E1165 Type 2 diabetes mellitus with hyperglycemia: Secondary | ICD-10-CM | POA: Diagnosis not present

## 2022-03-20 DIAGNOSIS — M6281 Muscle weakness (generalized): Secondary | ICD-10-CM | POA: Diagnosis not present

## 2022-03-21 DIAGNOSIS — R2681 Unsteadiness on feet: Secondary | ICD-10-CM | POA: Diagnosis not present

## 2022-03-21 DIAGNOSIS — E1165 Type 2 diabetes mellitus with hyperglycemia: Secondary | ICD-10-CM | POA: Diagnosis not present

## 2022-03-21 DIAGNOSIS — M6281 Muscle weakness (generalized): Secondary | ICD-10-CM | POA: Diagnosis not present

## 2022-03-22 DIAGNOSIS — M6281 Muscle weakness (generalized): Secondary | ICD-10-CM | POA: Diagnosis not present

## 2022-03-22 DIAGNOSIS — E1165 Type 2 diabetes mellitus with hyperglycemia: Secondary | ICD-10-CM | POA: Diagnosis not present

## 2022-03-22 DIAGNOSIS — R2681 Unsteadiness on feet: Secondary | ICD-10-CM | POA: Diagnosis not present

## 2022-03-24 DIAGNOSIS — M6281 Muscle weakness (generalized): Secondary | ICD-10-CM | POA: Diagnosis not present

## 2022-03-24 DIAGNOSIS — R2681 Unsteadiness on feet: Secondary | ICD-10-CM | POA: Diagnosis not present

## 2022-03-24 DIAGNOSIS — E1165 Type 2 diabetes mellitus with hyperglycemia: Secondary | ICD-10-CM | POA: Diagnosis not present

## 2022-03-26 DIAGNOSIS — M6281 Muscle weakness (generalized): Secondary | ICD-10-CM | POA: Diagnosis not present

## 2022-03-26 DIAGNOSIS — E1165 Type 2 diabetes mellitus with hyperglycemia: Secondary | ICD-10-CM | POA: Diagnosis not present

## 2022-03-26 DIAGNOSIS — R2681 Unsteadiness on feet: Secondary | ICD-10-CM | POA: Diagnosis not present

## 2022-03-27 DIAGNOSIS — E1165 Type 2 diabetes mellitus with hyperglycemia: Secondary | ICD-10-CM | POA: Diagnosis not present

## 2022-03-27 DIAGNOSIS — R2681 Unsteadiness on feet: Secondary | ICD-10-CM | POA: Diagnosis not present

## 2022-03-27 DIAGNOSIS — M6281 Muscle weakness (generalized): Secondary | ICD-10-CM | POA: Diagnosis not present

## 2022-03-28 DIAGNOSIS — R2681 Unsteadiness on feet: Secondary | ICD-10-CM | POA: Diagnosis not present

## 2022-03-28 DIAGNOSIS — M6281 Muscle weakness (generalized): Secondary | ICD-10-CM | POA: Diagnosis not present

## 2022-03-28 DIAGNOSIS — E1165 Type 2 diabetes mellitus with hyperglycemia: Secondary | ICD-10-CM | POA: Diagnosis not present

## 2022-03-28 DIAGNOSIS — S00411A Abrasion of right ear, initial encounter: Secondary | ICD-10-CM | POA: Diagnosis not present

## 2022-03-29 DIAGNOSIS — R2681 Unsteadiness on feet: Secondary | ICD-10-CM | POA: Diagnosis not present

## 2022-03-29 DIAGNOSIS — M6281 Muscle weakness (generalized): Secondary | ICD-10-CM | POA: Diagnosis not present

## 2022-03-29 DIAGNOSIS — E1165 Type 2 diabetes mellitus with hyperglycemia: Secondary | ICD-10-CM | POA: Diagnosis not present

## 2022-03-30 DIAGNOSIS — R2681 Unsteadiness on feet: Secondary | ICD-10-CM | POA: Diagnosis not present

## 2022-03-30 DIAGNOSIS — M6281 Muscle weakness (generalized): Secondary | ICD-10-CM | POA: Diagnosis not present

## 2022-03-30 DIAGNOSIS — E1165 Type 2 diabetes mellitus with hyperglycemia: Secondary | ICD-10-CM | POA: Diagnosis not present

## 2022-04-02 DIAGNOSIS — E1165 Type 2 diabetes mellitus with hyperglycemia: Secondary | ICD-10-CM | POA: Diagnosis not present

## 2022-04-02 DIAGNOSIS — M6281 Muscle weakness (generalized): Secondary | ICD-10-CM | POA: Diagnosis not present

## 2022-04-02 DIAGNOSIS — R2681 Unsteadiness on feet: Secondary | ICD-10-CM | POA: Diagnosis not present

## 2022-04-03 DIAGNOSIS — R2681 Unsteadiness on feet: Secondary | ICD-10-CM | POA: Diagnosis not present

## 2022-04-03 DIAGNOSIS — E1165 Type 2 diabetes mellitus with hyperglycemia: Secondary | ICD-10-CM | POA: Diagnosis not present

## 2022-04-03 DIAGNOSIS — M6281 Muscle weakness (generalized): Secondary | ICD-10-CM | POA: Diagnosis not present

## 2022-04-04 DIAGNOSIS — M6281 Muscle weakness (generalized): Secondary | ICD-10-CM | POA: Diagnosis not present

## 2022-04-04 DIAGNOSIS — E1165 Type 2 diabetes mellitus with hyperglycemia: Secondary | ICD-10-CM | POA: Diagnosis not present

## 2022-04-04 DIAGNOSIS — R2681 Unsteadiness on feet: Secondary | ICD-10-CM | POA: Diagnosis not present

## 2022-04-05 DIAGNOSIS — E1165 Type 2 diabetes mellitus with hyperglycemia: Secondary | ICD-10-CM | POA: Diagnosis not present

## 2022-04-05 DIAGNOSIS — M6281 Muscle weakness (generalized): Secondary | ICD-10-CM | POA: Diagnosis not present

## 2022-04-05 DIAGNOSIS — R2681 Unsteadiness on feet: Secondary | ICD-10-CM | POA: Diagnosis not present

## 2022-04-06 DIAGNOSIS — M6281 Muscle weakness (generalized): Secondary | ICD-10-CM | POA: Diagnosis not present

## 2022-04-06 DIAGNOSIS — E1165 Type 2 diabetes mellitus with hyperglycemia: Secondary | ICD-10-CM | POA: Diagnosis not present

## 2022-04-06 DIAGNOSIS — R2681 Unsteadiness on feet: Secondary | ICD-10-CM | POA: Diagnosis not present

## 2022-04-09 DIAGNOSIS — E1165 Type 2 diabetes mellitus with hyperglycemia: Secondary | ICD-10-CM | POA: Diagnosis not present

## 2022-04-09 DIAGNOSIS — R2681 Unsteadiness on feet: Secondary | ICD-10-CM | POA: Diagnosis not present

## 2022-04-09 DIAGNOSIS — M6281 Muscle weakness (generalized): Secondary | ICD-10-CM | POA: Diagnosis not present

## 2022-04-10 DIAGNOSIS — M6281 Muscle weakness (generalized): Secondary | ICD-10-CM | POA: Diagnosis not present

## 2022-04-10 DIAGNOSIS — R2681 Unsteadiness on feet: Secondary | ICD-10-CM | POA: Diagnosis not present

## 2022-04-10 DIAGNOSIS — E1165 Type 2 diabetes mellitus with hyperglycemia: Secondary | ICD-10-CM | POA: Diagnosis not present

## 2022-04-11 DIAGNOSIS — R2681 Unsteadiness on feet: Secondary | ICD-10-CM | POA: Diagnosis not present

## 2022-04-11 DIAGNOSIS — E1165 Type 2 diabetes mellitus with hyperglycemia: Secondary | ICD-10-CM | POA: Diagnosis not present

## 2022-04-11 DIAGNOSIS — M6281 Muscle weakness (generalized): Secondary | ICD-10-CM | POA: Diagnosis not present

## 2022-04-12 DIAGNOSIS — R2681 Unsteadiness on feet: Secondary | ICD-10-CM | POA: Diagnosis not present

## 2022-04-12 DIAGNOSIS — E1165 Type 2 diabetes mellitus with hyperglycemia: Secondary | ICD-10-CM | POA: Diagnosis not present

## 2022-04-12 DIAGNOSIS — M6281 Muscle weakness (generalized): Secondary | ICD-10-CM | POA: Diagnosis not present

## 2022-04-14 DIAGNOSIS — R2681 Unsteadiness on feet: Secondary | ICD-10-CM | POA: Diagnosis not present

## 2022-04-14 DIAGNOSIS — E1165 Type 2 diabetes mellitus with hyperglycemia: Secondary | ICD-10-CM | POA: Diagnosis not present

## 2022-04-14 DIAGNOSIS — M6281 Muscle weakness (generalized): Secondary | ICD-10-CM | POA: Diagnosis not present

## 2022-04-16 DIAGNOSIS — M6281 Muscle weakness (generalized): Secondary | ICD-10-CM | POA: Diagnosis not present

## 2022-04-16 DIAGNOSIS — B354 Tinea corporis: Secondary | ICD-10-CM | POA: Diagnosis not present

## 2022-04-16 DIAGNOSIS — E1165 Type 2 diabetes mellitus with hyperglycemia: Secondary | ICD-10-CM | POA: Diagnosis not present

## 2022-04-16 DIAGNOSIS — R2681 Unsteadiness on feet: Secondary | ICD-10-CM | POA: Diagnosis not present

## 2022-04-17 DIAGNOSIS — E1165 Type 2 diabetes mellitus with hyperglycemia: Secondary | ICD-10-CM | POA: Diagnosis not present

## 2022-04-17 DIAGNOSIS — M6281 Muscle weakness (generalized): Secondary | ICD-10-CM | POA: Diagnosis not present

## 2022-04-17 DIAGNOSIS — R2681 Unsteadiness on feet: Secondary | ICD-10-CM | POA: Diagnosis not present

## 2022-04-18 DIAGNOSIS — M6281 Muscle weakness (generalized): Secondary | ICD-10-CM | POA: Diagnosis not present

## 2022-04-18 DIAGNOSIS — E1165 Type 2 diabetes mellitus with hyperglycemia: Secondary | ICD-10-CM | POA: Diagnosis not present

## 2022-04-18 DIAGNOSIS — R2681 Unsteadiness on feet: Secondary | ICD-10-CM | POA: Diagnosis not present

## 2022-04-19 DIAGNOSIS — R2681 Unsteadiness on feet: Secondary | ICD-10-CM | POA: Diagnosis not present

## 2022-04-19 DIAGNOSIS — M6281 Muscle weakness (generalized): Secondary | ICD-10-CM | POA: Diagnosis not present

## 2022-04-19 DIAGNOSIS — E1165 Type 2 diabetes mellitus with hyperglycemia: Secondary | ICD-10-CM | POA: Diagnosis not present

## 2022-04-20 DIAGNOSIS — R2681 Unsteadiness on feet: Secondary | ICD-10-CM | POA: Diagnosis not present

## 2022-04-20 DIAGNOSIS — M6281 Muscle weakness (generalized): Secondary | ICD-10-CM | POA: Diagnosis not present

## 2022-04-20 DIAGNOSIS — E1165 Type 2 diabetes mellitus with hyperglycemia: Secondary | ICD-10-CM | POA: Diagnosis not present

## 2022-04-23 DIAGNOSIS — R2681 Unsteadiness on feet: Secondary | ICD-10-CM | POA: Diagnosis not present

## 2022-04-23 DIAGNOSIS — E1165 Type 2 diabetes mellitus with hyperglycemia: Secondary | ICD-10-CM | POA: Diagnosis not present

## 2022-04-23 DIAGNOSIS — M6281 Muscle weakness (generalized): Secondary | ICD-10-CM | POA: Diagnosis not present

## 2022-04-24 DIAGNOSIS — R2681 Unsteadiness on feet: Secondary | ICD-10-CM | POA: Diagnosis not present

## 2022-04-24 DIAGNOSIS — M6281 Muscle weakness (generalized): Secondary | ICD-10-CM | POA: Diagnosis not present

## 2022-04-24 DIAGNOSIS — E1165 Type 2 diabetes mellitus with hyperglycemia: Secondary | ICD-10-CM | POA: Diagnosis not present

## 2022-04-25 DIAGNOSIS — M6281 Muscle weakness (generalized): Secondary | ICD-10-CM | POA: Diagnosis not present

## 2022-04-25 DIAGNOSIS — G47419 Narcolepsy without cataplexy: Secondary | ICD-10-CM | POA: Diagnosis not present

## 2022-04-25 DIAGNOSIS — R2681 Unsteadiness on feet: Secondary | ICD-10-CM | POA: Diagnosis not present

## 2022-04-25 DIAGNOSIS — E1165 Type 2 diabetes mellitus with hyperglycemia: Secondary | ICD-10-CM | POA: Diagnosis not present

## 2022-04-26 DIAGNOSIS — S0031XA Abrasion of nose, initial encounter: Secondary | ICD-10-CM | POA: Diagnosis not present

## 2022-04-27 DIAGNOSIS — E1165 Type 2 diabetes mellitus with hyperglycemia: Secondary | ICD-10-CM | POA: Diagnosis not present

## 2022-04-27 DIAGNOSIS — M6281 Muscle weakness (generalized): Secondary | ICD-10-CM | POA: Diagnosis not present

## 2022-04-27 DIAGNOSIS — R2681 Unsteadiness on feet: Secondary | ICD-10-CM | POA: Diagnosis not present

## 2022-04-28 DIAGNOSIS — M6281 Muscle weakness (generalized): Secondary | ICD-10-CM | POA: Diagnosis not present

## 2022-04-28 DIAGNOSIS — E1165 Type 2 diabetes mellitus with hyperglycemia: Secondary | ICD-10-CM | POA: Diagnosis not present

## 2022-04-28 DIAGNOSIS — R2681 Unsteadiness on feet: Secondary | ICD-10-CM | POA: Diagnosis not present

## 2022-04-30 DIAGNOSIS — R2681 Unsteadiness on feet: Secondary | ICD-10-CM | POA: Diagnosis not present

## 2022-04-30 DIAGNOSIS — M6281 Muscle weakness (generalized): Secondary | ICD-10-CM | POA: Diagnosis not present

## 2022-04-30 DIAGNOSIS — E1165 Type 2 diabetes mellitus with hyperglycemia: Secondary | ICD-10-CM | POA: Diagnosis not present

## 2022-05-01 DIAGNOSIS — E1165 Type 2 diabetes mellitus with hyperglycemia: Secondary | ICD-10-CM | POA: Diagnosis not present

## 2022-05-01 DIAGNOSIS — M6281 Muscle weakness (generalized): Secondary | ICD-10-CM | POA: Diagnosis not present

## 2022-05-01 DIAGNOSIS — R2681 Unsteadiness on feet: Secondary | ICD-10-CM | POA: Diagnosis not present

## 2022-05-01 DIAGNOSIS — E119 Type 2 diabetes mellitus without complications: Secondary | ICD-10-CM | POA: Diagnosis not present

## 2022-05-02 DIAGNOSIS — R2681 Unsteadiness on feet: Secondary | ICD-10-CM | POA: Diagnosis not present

## 2022-05-02 DIAGNOSIS — E1165 Type 2 diabetes mellitus with hyperglycemia: Secondary | ICD-10-CM | POA: Diagnosis not present

## 2022-05-02 DIAGNOSIS — M6281 Muscle weakness (generalized): Secondary | ICD-10-CM | POA: Diagnosis not present

## 2022-05-03 DIAGNOSIS — M6281 Muscle weakness (generalized): Secondary | ICD-10-CM | POA: Diagnosis not present

## 2022-05-03 DIAGNOSIS — R2681 Unsteadiness on feet: Secondary | ICD-10-CM | POA: Diagnosis not present

## 2022-05-03 DIAGNOSIS — E1165 Type 2 diabetes mellitus with hyperglycemia: Secondary | ICD-10-CM | POA: Diagnosis not present

## 2022-05-04 DIAGNOSIS — R2681 Unsteadiness on feet: Secondary | ICD-10-CM | POA: Diagnosis not present

## 2022-05-04 DIAGNOSIS — E1165 Type 2 diabetes mellitus with hyperglycemia: Secondary | ICD-10-CM | POA: Diagnosis not present

## 2022-05-04 DIAGNOSIS — M6281 Muscle weakness (generalized): Secondary | ICD-10-CM | POA: Diagnosis not present

## 2022-05-07 DIAGNOSIS — M6281 Muscle weakness (generalized): Secondary | ICD-10-CM | POA: Diagnosis not present

## 2022-05-07 DIAGNOSIS — R2681 Unsteadiness on feet: Secondary | ICD-10-CM | POA: Diagnosis not present

## 2022-05-07 DIAGNOSIS — E1165 Type 2 diabetes mellitus with hyperglycemia: Secondary | ICD-10-CM | POA: Diagnosis not present

## 2022-05-08 DIAGNOSIS — E1165 Type 2 diabetes mellitus with hyperglycemia: Secondary | ICD-10-CM | POA: Diagnosis not present

## 2022-05-08 DIAGNOSIS — M6281 Muscle weakness (generalized): Secondary | ICD-10-CM | POA: Diagnosis not present

## 2022-05-08 DIAGNOSIS — R2681 Unsteadiness on feet: Secondary | ICD-10-CM | POA: Diagnosis not present

## 2022-05-09 DIAGNOSIS — R2681 Unsteadiness on feet: Secondary | ICD-10-CM | POA: Diagnosis not present

## 2022-05-09 DIAGNOSIS — M6281 Muscle weakness (generalized): Secondary | ICD-10-CM | POA: Diagnosis not present

## 2022-05-09 DIAGNOSIS — E1165 Type 2 diabetes mellitus with hyperglycemia: Secondary | ICD-10-CM | POA: Diagnosis not present

## 2022-05-10 DIAGNOSIS — M6281 Muscle weakness (generalized): Secondary | ICD-10-CM | POA: Diagnosis not present

## 2022-05-10 DIAGNOSIS — E1165 Type 2 diabetes mellitus with hyperglycemia: Secondary | ICD-10-CM | POA: Diagnosis not present

## 2022-05-10 DIAGNOSIS — R2681 Unsteadiness on feet: Secondary | ICD-10-CM | POA: Diagnosis not present

## 2022-05-11 DIAGNOSIS — R2681 Unsteadiness on feet: Secondary | ICD-10-CM | POA: Diagnosis not present

## 2022-05-11 DIAGNOSIS — E1165 Type 2 diabetes mellitus with hyperglycemia: Secondary | ICD-10-CM | POA: Diagnosis not present

## 2022-05-11 DIAGNOSIS — M6281 Muscle weakness (generalized): Secondary | ICD-10-CM | POA: Diagnosis not present

## 2022-05-15 DIAGNOSIS — R2681 Unsteadiness on feet: Secondary | ICD-10-CM | POA: Diagnosis not present

## 2022-05-15 DIAGNOSIS — E1165 Type 2 diabetes mellitus with hyperglycemia: Secondary | ICD-10-CM | POA: Diagnosis not present

## 2022-05-15 DIAGNOSIS — M6281 Muscle weakness (generalized): Secondary | ICD-10-CM | POA: Diagnosis not present

## 2022-05-16 DIAGNOSIS — R2681 Unsteadiness on feet: Secondary | ICD-10-CM | POA: Diagnosis not present

## 2022-05-16 DIAGNOSIS — E1165 Type 2 diabetes mellitus with hyperglycemia: Secondary | ICD-10-CM | POA: Diagnosis not present

## 2022-05-16 DIAGNOSIS — M6281 Muscle weakness (generalized): Secondary | ICD-10-CM | POA: Diagnosis not present

## 2022-05-17 DIAGNOSIS — L309 Dermatitis, unspecified: Secondary | ICD-10-CM | POA: Diagnosis not present

## 2022-05-17 DIAGNOSIS — M6281 Muscle weakness (generalized): Secondary | ICD-10-CM | POA: Diagnosis not present

## 2022-05-17 DIAGNOSIS — E1165 Type 2 diabetes mellitus with hyperglycemia: Secondary | ICD-10-CM | POA: Diagnosis not present

## 2022-05-17 DIAGNOSIS — R2681 Unsteadiness on feet: Secondary | ICD-10-CM | POA: Diagnosis not present

## 2022-05-18 DIAGNOSIS — E1165 Type 2 diabetes mellitus with hyperglycemia: Secondary | ICD-10-CM | POA: Diagnosis not present

## 2022-05-18 DIAGNOSIS — M6281 Muscle weakness (generalized): Secondary | ICD-10-CM | POA: Diagnosis not present

## 2022-05-18 DIAGNOSIS — R2681 Unsteadiness on feet: Secondary | ICD-10-CM | POA: Diagnosis not present

## 2022-05-19 DIAGNOSIS — E1165 Type 2 diabetes mellitus with hyperglycemia: Secondary | ICD-10-CM | POA: Diagnosis not present

## 2022-05-19 DIAGNOSIS — M6281 Muscle weakness (generalized): Secondary | ICD-10-CM | POA: Diagnosis not present

## 2022-05-19 DIAGNOSIS — R2681 Unsteadiness on feet: Secondary | ICD-10-CM | POA: Diagnosis not present

## 2022-05-21 DIAGNOSIS — E1165 Type 2 diabetes mellitus with hyperglycemia: Secondary | ICD-10-CM | POA: Diagnosis not present

## 2022-05-21 DIAGNOSIS — R2681 Unsteadiness on feet: Secondary | ICD-10-CM | POA: Diagnosis not present

## 2022-05-21 DIAGNOSIS — M6281 Muscle weakness (generalized): Secondary | ICD-10-CM | POA: Diagnosis not present

## 2022-05-22 DIAGNOSIS — R2681 Unsteadiness on feet: Secondary | ICD-10-CM | POA: Diagnosis not present

## 2022-05-22 DIAGNOSIS — E1165 Type 2 diabetes mellitus with hyperglycemia: Secondary | ICD-10-CM | POA: Diagnosis not present

## 2022-05-22 DIAGNOSIS — M6281 Muscle weakness (generalized): Secondary | ICD-10-CM | POA: Diagnosis not present

## 2022-05-23 DIAGNOSIS — M6281 Muscle weakness (generalized): Secondary | ICD-10-CM | POA: Diagnosis not present

## 2022-05-23 DIAGNOSIS — R2681 Unsteadiness on feet: Secondary | ICD-10-CM | POA: Diagnosis not present

## 2022-05-23 DIAGNOSIS — K219 Gastro-esophageal reflux disease without esophagitis: Secondary | ICD-10-CM | POA: Diagnosis not present

## 2022-05-23 DIAGNOSIS — E1165 Type 2 diabetes mellitus with hyperglycemia: Secondary | ICD-10-CM | POA: Diagnosis not present

## 2022-05-24 DIAGNOSIS — R2681 Unsteadiness on feet: Secondary | ICD-10-CM | POA: Diagnosis not present

## 2022-05-24 DIAGNOSIS — E1165 Type 2 diabetes mellitus with hyperglycemia: Secondary | ICD-10-CM | POA: Diagnosis not present

## 2022-05-24 DIAGNOSIS — M6281 Muscle weakness (generalized): Secondary | ICD-10-CM | POA: Diagnosis not present

## 2022-05-29 DIAGNOSIS — B351 Tinea unguium: Secondary | ICD-10-CM | POA: Diagnosis not present

## 2022-05-29 DIAGNOSIS — I739 Peripheral vascular disease, unspecified: Secondary | ICD-10-CM | POA: Diagnosis not present

## 2022-05-29 DIAGNOSIS — L600 Ingrowing nail: Secondary | ICD-10-CM | POA: Diagnosis not present

## 2022-06-08 DIAGNOSIS — L299 Pruritus, unspecified: Secondary | ICD-10-CM | POA: Diagnosis not present

## 2022-06-14 DIAGNOSIS — M6281 Muscle weakness (generalized): Secondary | ICD-10-CM | POA: Diagnosis not present

## 2022-06-14 DIAGNOSIS — R2681 Unsteadiness on feet: Secondary | ICD-10-CM | POA: Diagnosis not present

## 2022-06-14 DIAGNOSIS — E1165 Type 2 diabetes mellitus with hyperglycemia: Secondary | ICD-10-CM | POA: Diagnosis not present

## 2022-06-15 DIAGNOSIS — E1165 Type 2 diabetes mellitus with hyperglycemia: Secondary | ICD-10-CM | POA: Diagnosis not present

## 2022-06-15 DIAGNOSIS — R2681 Unsteadiness on feet: Secondary | ICD-10-CM | POA: Diagnosis not present

## 2022-06-15 DIAGNOSIS — M6281 Muscle weakness (generalized): Secondary | ICD-10-CM | POA: Diagnosis not present

## 2022-06-18 DIAGNOSIS — E1165 Type 2 diabetes mellitus with hyperglycemia: Secondary | ICD-10-CM | POA: Diagnosis not present

## 2022-06-18 DIAGNOSIS — R2681 Unsteadiness on feet: Secondary | ICD-10-CM | POA: Diagnosis not present

## 2022-06-18 DIAGNOSIS — M6281 Muscle weakness (generalized): Secondary | ICD-10-CM | POA: Diagnosis not present

## 2022-06-19 DIAGNOSIS — E1165 Type 2 diabetes mellitus with hyperglycemia: Secondary | ICD-10-CM | POA: Diagnosis not present

## 2022-06-19 DIAGNOSIS — R2681 Unsteadiness on feet: Secondary | ICD-10-CM | POA: Diagnosis not present

## 2022-06-19 DIAGNOSIS — M6281 Muscle weakness (generalized): Secondary | ICD-10-CM | POA: Diagnosis not present

## 2022-06-20 DIAGNOSIS — E1165 Type 2 diabetes mellitus with hyperglycemia: Secondary | ICD-10-CM | POA: Diagnosis not present

## 2022-06-20 DIAGNOSIS — M6281 Muscle weakness (generalized): Secondary | ICD-10-CM | POA: Diagnosis not present

## 2022-06-20 DIAGNOSIS — R2681 Unsteadiness on feet: Secondary | ICD-10-CM | POA: Diagnosis not present

## 2022-06-21 DIAGNOSIS — R2681 Unsteadiness on feet: Secondary | ICD-10-CM | POA: Diagnosis not present

## 2022-06-21 DIAGNOSIS — M6281 Muscle weakness (generalized): Secondary | ICD-10-CM | POA: Diagnosis not present

## 2022-06-21 DIAGNOSIS — E1165 Type 2 diabetes mellitus with hyperglycemia: Secondary | ICD-10-CM | POA: Diagnosis not present

## 2022-06-22 DIAGNOSIS — E1165 Type 2 diabetes mellitus with hyperglycemia: Secondary | ICD-10-CM | POA: Diagnosis not present

## 2022-06-22 DIAGNOSIS — M6281 Muscle weakness (generalized): Secondary | ICD-10-CM | POA: Diagnosis not present

## 2022-06-22 DIAGNOSIS — R2681 Unsteadiness on feet: Secondary | ICD-10-CM | POA: Diagnosis not present

## 2022-06-25 DIAGNOSIS — M6281 Muscle weakness (generalized): Secondary | ICD-10-CM | POA: Diagnosis not present

## 2022-06-25 DIAGNOSIS — R2681 Unsteadiness on feet: Secondary | ICD-10-CM | POA: Diagnosis not present

## 2022-06-25 DIAGNOSIS — E1165 Type 2 diabetes mellitus with hyperglycemia: Secondary | ICD-10-CM | POA: Diagnosis not present

## 2022-06-26 DIAGNOSIS — M6281 Muscle weakness (generalized): Secondary | ICD-10-CM | POA: Diagnosis not present

## 2022-06-26 DIAGNOSIS — R2681 Unsteadiness on feet: Secondary | ICD-10-CM | POA: Diagnosis not present

## 2022-06-26 DIAGNOSIS — E1165 Type 2 diabetes mellitus with hyperglycemia: Secondary | ICD-10-CM | POA: Diagnosis not present

## 2022-06-27 DIAGNOSIS — M6281 Muscle weakness (generalized): Secondary | ICD-10-CM | POA: Diagnosis not present

## 2022-06-27 DIAGNOSIS — R2681 Unsteadiness on feet: Secondary | ICD-10-CM | POA: Diagnosis not present

## 2022-06-27 DIAGNOSIS — E1165 Type 2 diabetes mellitus with hyperglycemia: Secondary | ICD-10-CM | POA: Diagnosis not present

## 2022-06-28 DIAGNOSIS — R2681 Unsteadiness on feet: Secondary | ICD-10-CM | POA: Diagnosis not present

## 2022-06-28 DIAGNOSIS — E1165 Type 2 diabetes mellitus with hyperglycemia: Secondary | ICD-10-CM | POA: Diagnosis not present

## 2022-06-28 DIAGNOSIS — M6281 Muscle weakness (generalized): Secondary | ICD-10-CM | POA: Diagnosis not present

## 2022-06-29 DIAGNOSIS — R2681 Unsteadiness on feet: Secondary | ICD-10-CM | POA: Diagnosis not present

## 2022-06-29 DIAGNOSIS — M6281 Muscle weakness (generalized): Secondary | ICD-10-CM | POA: Diagnosis not present

## 2022-06-29 DIAGNOSIS — E1165 Type 2 diabetes mellitus with hyperglycemia: Secondary | ICD-10-CM | POA: Diagnosis not present

## 2022-07-02 DIAGNOSIS — E1165 Type 2 diabetes mellitus with hyperglycemia: Secondary | ICD-10-CM | POA: Diagnosis not present

## 2022-07-02 DIAGNOSIS — M6281 Muscle weakness (generalized): Secondary | ICD-10-CM | POA: Diagnosis not present

## 2022-07-02 DIAGNOSIS — R2681 Unsteadiness on feet: Secondary | ICD-10-CM | POA: Diagnosis not present

## 2022-07-03 DIAGNOSIS — R2681 Unsteadiness on feet: Secondary | ICD-10-CM | POA: Diagnosis not present

## 2022-07-03 DIAGNOSIS — E1165 Type 2 diabetes mellitus with hyperglycemia: Secondary | ICD-10-CM | POA: Diagnosis not present

## 2022-07-03 DIAGNOSIS — M6281 Muscle weakness (generalized): Secondary | ICD-10-CM | POA: Diagnosis not present

## 2022-07-04 DIAGNOSIS — L299 Pruritus, unspecified: Secondary | ICD-10-CM | POA: Diagnosis not present

## 2022-07-04 DIAGNOSIS — M1990 Unspecified osteoarthritis, unspecified site: Secondary | ICD-10-CM | POA: Diagnosis not present

## 2022-07-04 DIAGNOSIS — R278 Other lack of coordination: Secondary | ICD-10-CM | POA: Diagnosis not present

## 2022-07-04 DIAGNOSIS — M24542 Contracture, left hand: Secondary | ICD-10-CM | POA: Diagnosis not present

## 2022-07-04 DIAGNOSIS — B354 Tinea corporis: Secondary | ICD-10-CM | POA: Diagnosis not present

## 2022-07-04 DIAGNOSIS — L259 Unspecified contact dermatitis, unspecified cause: Secondary | ICD-10-CM | POA: Diagnosis not present

## 2022-07-04 DIAGNOSIS — M24541 Contracture, right hand: Secondary | ICD-10-CM | POA: Diagnosis not present

## 2022-07-05 DIAGNOSIS — L259 Unspecified contact dermatitis, unspecified cause: Secondary | ICD-10-CM | POA: Diagnosis not present

## 2022-07-05 DIAGNOSIS — B354 Tinea corporis: Secondary | ICD-10-CM | POA: Diagnosis not present

## 2022-07-05 DIAGNOSIS — L299 Pruritus, unspecified: Secondary | ICD-10-CM | POA: Diagnosis not present

## 2022-07-05 DIAGNOSIS — M1990 Unspecified osteoarthritis, unspecified site: Secondary | ICD-10-CM | POA: Diagnosis not present

## 2022-07-05 DIAGNOSIS — R278 Other lack of coordination: Secondary | ICD-10-CM | POA: Diagnosis not present

## 2022-07-05 DIAGNOSIS — M24542 Contracture, left hand: Secondary | ICD-10-CM | POA: Diagnosis not present

## 2022-07-05 DIAGNOSIS — M24541 Contracture, right hand: Secondary | ICD-10-CM | POA: Diagnosis not present

## 2022-07-06 DIAGNOSIS — L259 Unspecified contact dermatitis, unspecified cause: Secondary | ICD-10-CM | POA: Diagnosis not present

## 2022-07-06 DIAGNOSIS — M24541 Contracture, right hand: Secondary | ICD-10-CM | POA: Diagnosis not present

## 2022-07-06 DIAGNOSIS — M1990 Unspecified osteoarthritis, unspecified site: Secondary | ICD-10-CM | POA: Diagnosis not present

## 2022-07-06 DIAGNOSIS — R278 Other lack of coordination: Secondary | ICD-10-CM | POA: Diagnosis not present

## 2022-07-06 DIAGNOSIS — B354 Tinea corporis: Secondary | ICD-10-CM | POA: Diagnosis not present

## 2022-07-06 DIAGNOSIS — M24542 Contracture, left hand: Secondary | ICD-10-CM | POA: Diagnosis not present

## 2022-07-06 DIAGNOSIS — L299 Pruritus, unspecified: Secondary | ICD-10-CM | POA: Diagnosis not present

## 2022-07-09 DIAGNOSIS — M24542 Contracture, left hand: Secondary | ICD-10-CM | POA: Diagnosis not present

## 2022-07-09 DIAGNOSIS — M24541 Contracture, right hand: Secondary | ICD-10-CM | POA: Diagnosis not present

## 2022-07-09 DIAGNOSIS — L259 Unspecified contact dermatitis, unspecified cause: Secondary | ICD-10-CM | POA: Diagnosis not present

## 2022-07-09 DIAGNOSIS — B354 Tinea corporis: Secondary | ICD-10-CM | POA: Diagnosis not present

## 2022-07-09 DIAGNOSIS — M1990 Unspecified osteoarthritis, unspecified site: Secondary | ICD-10-CM | POA: Diagnosis not present

## 2022-07-09 DIAGNOSIS — R278 Other lack of coordination: Secondary | ICD-10-CM | POA: Diagnosis not present

## 2022-07-09 DIAGNOSIS — L299 Pruritus, unspecified: Secondary | ICD-10-CM | POA: Diagnosis not present

## 2022-07-10 DIAGNOSIS — M24542 Contracture, left hand: Secondary | ICD-10-CM | POA: Diagnosis not present

## 2022-07-10 DIAGNOSIS — R278 Other lack of coordination: Secondary | ICD-10-CM | POA: Diagnosis not present

## 2022-07-10 DIAGNOSIS — M1990 Unspecified osteoarthritis, unspecified site: Secondary | ICD-10-CM | POA: Diagnosis not present

## 2022-07-10 DIAGNOSIS — L299 Pruritus, unspecified: Secondary | ICD-10-CM | POA: Diagnosis not present

## 2022-07-10 DIAGNOSIS — L259 Unspecified contact dermatitis, unspecified cause: Secondary | ICD-10-CM | POA: Diagnosis not present

## 2022-07-10 DIAGNOSIS — B354 Tinea corporis: Secondary | ICD-10-CM | POA: Diagnosis not present

## 2022-07-10 DIAGNOSIS — M24541 Contracture, right hand: Secondary | ICD-10-CM | POA: Diagnosis not present

## 2022-07-11 DIAGNOSIS — M24542 Contracture, left hand: Secondary | ICD-10-CM | POA: Diagnosis not present

## 2022-07-11 DIAGNOSIS — M1990 Unspecified osteoarthritis, unspecified site: Secondary | ICD-10-CM | POA: Diagnosis not present

## 2022-07-11 DIAGNOSIS — R278 Other lack of coordination: Secondary | ICD-10-CM | POA: Diagnosis not present

## 2022-07-11 DIAGNOSIS — B354 Tinea corporis: Secondary | ICD-10-CM | POA: Diagnosis not present

## 2022-07-11 DIAGNOSIS — L299 Pruritus, unspecified: Secondary | ICD-10-CM | POA: Diagnosis not present

## 2022-07-11 DIAGNOSIS — L259 Unspecified contact dermatitis, unspecified cause: Secondary | ICD-10-CM | POA: Diagnosis not present

## 2022-07-11 DIAGNOSIS — M24541 Contracture, right hand: Secondary | ICD-10-CM | POA: Diagnosis not present

## 2022-07-12 DIAGNOSIS — M24541 Contracture, right hand: Secondary | ICD-10-CM | POA: Diagnosis not present

## 2022-07-12 DIAGNOSIS — R278 Other lack of coordination: Secondary | ICD-10-CM | POA: Diagnosis not present

## 2022-07-12 DIAGNOSIS — M24542 Contracture, left hand: Secondary | ICD-10-CM | POA: Diagnosis not present

## 2022-07-12 DIAGNOSIS — B354 Tinea corporis: Secondary | ICD-10-CM | POA: Diagnosis not present

## 2022-07-12 DIAGNOSIS — L259 Unspecified contact dermatitis, unspecified cause: Secondary | ICD-10-CM | POA: Diagnosis not present

## 2022-07-12 DIAGNOSIS — M1990 Unspecified osteoarthritis, unspecified site: Secondary | ICD-10-CM | POA: Diagnosis not present

## 2022-07-12 DIAGNOSIS — L299 Pruritus, unspecified: Secondary | ICD-10-CM | POA: Diagnosis not present

## 2022-07-13 DIAGNOSIS — L259 Unspecified contact dermatitis, unspecified cause: Secondary | ICD-10-CM | POA: Diagnosis not present

## 2022-07-13 DIAGNOSIS — B354 Tinea corporis: Secondary | ICD-10-CM | POA: Diagnosis not present

## 2022-07-13 DIAGNOSIS — M24542 Contracture, left hand: Secondary | ICD-10-CM | POA: Diagnosis not present

## 2022-07-13 DIAGNOSIS — M24541 Contracture, right hand: Secondary | ICD-10-CM | POA: Diagnosis not present

## 2022-07-13 DIAGNOSIS — R278 Other lack of coordination: Secondary | ICD-10-CM | POA: Diagnosis not present

## 2022-07-13 DIAGNOSIS — M1990 Unspecified osteoarthritis, unspecified site: Secondary | ICD-10-CM | POA: Diagnosis not present

## 2022-07-13 DIAGNOSIS — L299 Pruritus, unspecified: Secondary | ICD-10-CM | POA: Diagnosis not present

## 2022-07-16 DIAGNOSIS — B354 Tinea corporis: Secondary | ICD-10-CM | POA: Diagnosis not present

## 2022-07-16 DIAGNOSIS — Z13228 Encounter for screening for other metabolic disorders: Secondary | ICD-10-CM | POA: Diagnosis not present

## 2022-07-16 DIAGNOSIS — M1990 Unspecified osteoarthritis, unspecified site: Secondary | ICD-10-CM | POA: Diagnosis not present

## 2022-07-16 DIAGNOSIS — M24542 Contracture, left hand: Secondary | ICD-10-CM | POA: Diagnosis not present

## 2022-07-16 DIAGNOSIS — R278 Other lack of coordination: Secondary | ICD-10-CM | POA: Diagnosis not present

## 2022-07-16 DIAGNOSIS — Z1389 Encounter for screening for other disorder: Secondary | ICD-10-CM | POA: Diagnosis not present

## 2022-07-16 DIAGNOSIS — Z130 Encounter for screening for diseases of the blood and blood-forming organs and certain disorders involving the immune mechanism: Secondary | ICD-10-CM | POA: Diagnosis not present

## 2022-07-16 DIAGNOSIS — M24541 Contracture, right hand: Secondary | ICD-10-CM | POA: Diagnosis not present

## 2022-07-16 DIAGNOSIS — L299 Pruritus, unspecified: Secondary | ICD-10-CM | POA: Diagnosis not present

## 2022-07-16 DIAGNOSIS — Z5181 Encounter for therapeutic drug level monitoring: Secondary | ICD-10-CM | POA: Diagnosis not present

## 2022-07-16 DIAGNOSIS — D709 Neutropenia, unspecified: Secondary | ICD-10-CM | POA: Diagnosis not present

## 2022-07-16 DIAGNOSIS — L259 Unspecified contact dermatitis, unspecified cause: Secondary | ICD-10-CM | POA: Diagnosis not present

## 2022-07-16 DIAGNOSIS — L290 Pruritus ani: Secondary | ICD-10-CM | POA: Diagnosis not present

## 2022-07-17 DIAGNOSIS — R278 Other lack of coordination: Secondary | ICD-10-CM | POA: Diagnosis not present

## 2022-07-17 DIAGNOSIS — B354 Tinea corporis: Secondary | ICD-10-CM | POA: Diagnosis not present

## 2022-07-17 DIAGNOSIS — M1990 Unspecified osteoarthritis, unspecified site: Secondary | ICD-10-CM | POA: Diagnosis not present

## 2022-07-17 DIAGNOSIS — L299 Pruritus, unspecified: Secondary | ICD-10-CM | POA: Diagnosis not present

## 2022-07-17 DIAGNOSIS — M24542 Contracture, left hand: Secondary | ICD-10-CM | POA: Diagnosis not present

## 2022-07-17 DIAGNOSIS — L259 Unspecified contact dermatitis, unspecified cause: Secondary | ICD-10-CM | POA: Diagnosis not present

## 2022-07-17 DIAGNOSIS — M24541 Contracture, right hand: Secondary | ICD-10-CM | POA: Diagnosis not present

## 2022-07-18 DIAGNOSIS — L299 Pruritus, unspecified: Secondary | ICD-10-CM | POA: Diagnosis not present

## 2022-07-18 DIAGNOSIS — L259 Unspecified contact dermatitis, unspecified cause: Secondary | ICD-10-CM | POA: Diagnosis not present

## 2022-07-18 DIAGNOSIS — M1990 Unspecified osteoarthritis, unspecified site: Secondary | ICD-10-CM | POA: Diagnosis not present

## 2022-07-18 DIAGNOSIS — M24542 Contracture, left hand: Secondary | ICD-10-CM | POA: Diagnosis not present

## 2022-07-18 DIAGNOSIS — B354 Tinea corporis: Secondary | ICD-10-CM | POA: Diagnosis not present

## 2022-07-18 DIAGNOSIS — R278 Other lack of coordination: Secondary | ICD-10-CM | POA: Diagnosis not present

## 2022-07-18 DIAGNOSIS — M24541 Contracture, right hand: Secondary | ICD-10-CM | POA: Diagnosis not present

## 2022-07-19 DIAGNOSIS — R278 Other lack of coordination: Secondary | ICD-10-CM | POA: Diagnosis not present

## 2022-07-19 DIAGNOSIS — B354 Tinea corporis: Secondary | ICD-10-CM | POA: Diagnosis not present

## 2022-07-19 DIAGNOSIS — M24541 Contracture, right hand: Secondary | ICD-10-CM | POA: Diagnosis not present

## 2022-07-19 DIAGNOSIS — L299 Pruritus, unspecified: Secondary | ICD-10-CM | POA: Diagnosis not present

## 2022-07-19 DIAGNOSIS — L259 Unspecified contact dermatitis, unspecified cause: Secondary | ICD-10-CM | POA: Diagnosis not present

## 2022-07-19 DIAGNOSIS — M24542 Contracture, left hand: Secondary | ICD-10-CM | POA: Diagnosis not present

## 2022-07-19 DIAGNOSIS — M1990 Unspecified osteoarthritis, unspecified site: Secondary | ICD-10-CM | POA: Diagnosis not present

## 2022-07-24 ENCOUNTER — Ambulatory Visit (INDEPENDENT_AMBULATORY_CARE_PROVIDER_SITE_OTHER): Payer: Medicare Other | Admitting: Internal Medicine

## 2022-07-24 ENCOUNTER — Other Ambulatory Visit: Payer: Self-pay

## 2022-07-24 ENCOUNTER — Encounter: Payer: Self-pay | Admitting: Internal Medicine

## 2022-07-24 VITALS — BP 143/83 | HR 65 | Resp 16 | Ht 63.0 in | Wt 180.0 lb

## 2022-07-24 DIAGNOSIS — Z113 Encounter for screening for infections with a predominantly sexual mode of transmission: Secondary | ICD-10-CM | POA: Diagnosis not present

## 2022-07-24 DIAGNOSIS — B20 Human immunodeficiency virus [HIV] disease: Secondary | ICD-10-CM

## 2022-07-24 DIAGNOSIS — E785 Hyperlipidemia, unspecified: Secondary | ICD-10-CM

## 2022-07-24 DIAGNOSIS — S0031XA Abrasion of nose, initial encounter: Secondary | ICD-10-CM

## 2022-07-24 MED ORDER — MUPIROCIN 2 % EX OINT: 1.0000 | TOPICAL_OINTMENT | Freq: Two times a day (BID) | CUTANEOUS | 0 refills | Status: DC

## 2022-07-24 NOTE — Assessment & Plan Note (Signed)
Will screen 

## 2022-07-24 NOTE — Assessment & Plan Note (Signed)
She is doing well by report with no concerns.  Will verify with labs today and she can rtc in 6 months.

## 2022-07-24 NOTE — Assessment & Plan Note (Signed)
Will check her lipid panel

## 2022-07-24 NOTE — Progress Notes (Signed)
   Subjective:    Patient ID: Susan [name], female    DOB: [birth year], 61 y.o.   MRN: [id number]  HPI Susan [name] is here  for follow up of HIV She continues on Triumeq and nightly Tivicay on phenobarbital and has no issues.  No labs prior to the visit.  She remains at the NH following her previous stroke and continues to improve.  She is hopeful to leave the NH to home sooon.  She scratches her nose and has abrasions on it.     Review of Systems  Constitutional:  Negative for fatigue.  Gastrointestinal:  Negative for diarrhea.  Skin:  Negative for rash.       Objective:   Physical Exam Eyes:     General: No scleral icterus. Pulmonary:     Effort: Pulmonary effort is normal.  Neurological:     Mental Status: She is alert.   SH: no tobacco presently        Assessment & Plan:

## 2022-07-24 NOTE — Assessment & Plan Note (Signed)
New problem and reportedly is scratching it.  Can use topical bactroban on it to see if that helps it heal

## 2022-07-26 LAB — COMPLETE METABOLIC PANEL WITH GFR
AG Ratio: 1.2 (calc) (ref 1.0–2.5)
ALT: 17 U/L (ref 6–29)
AST: 17 U/L (ref 10–35)
Albumin: 3.7 g/dL (ref 3.6–5.1)
Alkaline phosphatase (APISO): 115 U/L (ref 37–153)
BUN: 13 mg/dL (ref 7–25)
CO2: 31 mmol/L (ref 20–32)
Calcium: 8.8 mg/dL (ref 8.6–10.4)
Chloride: 100 mmol/L (ref 98–110)
Creat: 0.68 mg/dL (ref 0.50–1.05)
Globulin: 3.2 g/dL (calc) (ref 1.9–3.7)
Glucose, Bld: 118 mg/dL — ABNORMAL HIGH (ref 65–99)
Potassium: 3.8 mmol/L (ref 3.5–5.3)
Sodium: 139 mmol/L (ref 135–146)
Total Bilirubin: 0.2 mg/dL (ref 0.2–1.2)
Total Protein: 6.9 g/dL (ref 6.1–8.1)
eGFR: 100 mL/min/{1.73_m2} (ref >=60)

## 2022-07-26 LAB — CBC WITH DIFFERENTIAL/PLATELET
Absolute Monocytes: 315 cells/uL (ref 200–950)
Basophils Absolute: 20 cells/uL (ref 0–200)
Basophils Relative: 0.4 %
Eosinophils Absolute: 210 cells/uL (ref 15–500)
Eosinophils Relative: 4.2 %
HCT: 42.5 % (ref 35.0–45.0)
Hemoglobin: 15 g/dL (ref 11.7–15.5)
Lymphs Abs: 2295 cells/uL (ref 850–3900)
MCH: 34.2 pg — ABNORMAL HIGH (ref 27.0–33.0)
MCHC: 35.3 g/dL (ref 32.0–36.0)
MCV: 97 fL (ref 80.0–100.0)
MPV: 9.9 fL (ref 7.5–12.5)
Monocytes Relative: 6.3 %
Neutro Abs: 2160 cells/uL (ref 1500–7800)
Neutrophils Relative %: 43.2 %
Platelets: 202 10*3/uL (ref 140–400)
RBC: 4.38 10*6/uL (ref 3.80–5.10)
RDW: 13.1 % (ref 11.0–15.0)
Total Lymphocyte: 45.9 %
WBC: 5 10*3/uL (ref 3.8–10.8)

## 2022-07-26 LAB — LIPID PANEL
Cholesterol: 166 mg/dL (ref <200)
HDL: 49 mg/dL — ABNORMAL LOW (ref >=50)
LDL Cholesterol (Calc): 87 mg/dL (calc)
Non-HDL Cholesterol (Calc): 117 mg/dL (calc) (ref <130)
Total CHOL/HDL Ratio: 3.4 (calc) (ref <5.0)
Triglycerides: 198 mg/dL — ABNORMAL HIGH (ref <150)

## 2022-07-26 LAB — HIV-1 RNA QUANT-NO REFLEX-BLD
HIV 1 RNA Quant: <20 Copies/mL — ABNORMAL HIGH
HIV-1 RNA Quant, Log: <1.3 Log cps/mL — ABNORMAL HIGH

## 2022-07-26 LAB — RPR: RPR Ser Ql: NONREACTIVE

## 2022-07-27 LAB — T-HELPER CELL (CD4) - (RCID CLINIC ONLY)
CD4 % Helper T Cell: 31 % — ABNORMAL LOW (ref 33–65)
CD4 T Cell Abs: 606 /uL (ref 400–1790)

## 2022-08-01 DIAGNOSIS — E1165 Type 2 diabetes mellitus with hyperglycemia: Secondary | ICD-10-CM | POA: Diagnosis not present

## 2022-08-02 DIAGNOSIS — E1165 Type 2 diabetes mellitus with hyperglycemia: Secondary | ICD-10-CM | POA: Diagnosis not present

## 2022-08-03 DIAGNOSIS — L290 Pruritus ani: Secondary | ICD-10-CM | POA: Diagnosis not present

## 2022-08-06 DIAGNOSIS — Z043 Encounter for examination and observation following other accident: Secondary | ICD-10-CM | POA: Diagnosis not present

## 2022-08-07 DIAGNOSIS — B351 Tinea unguium: Secondary | ICD-10-CM | POA: Diagnosis not present

## 2022-08-07 DIAGNOSIS — I739 Peripheral vascular disease, unspecified: Secondary | ICD-10-CM | POA: Diagnosis not present

## 2022-10-09 DIAGNOSIS — M24542 Contracture, left hand: Secondary | ICD-10-CM | POA: Diagnosis not present

## 2022-10-09 DIAGNOSIS — M24541 Contracture, right hand: Secondary | ICD-10-CM | POA: Diagnosis not present

## 2022-10-09 DIAGNOSIS — Z741 Need for assistance with personal care: Secondary | ICD-10-CM | POA: Diagnosis not present

## 2022-10-10 DIAGNOSIS — M24541 Contracture, right hand: Secondary | ICD-10-CM | POA: Diagnosis not present

## 2022-10-10 DIAGNOSIS — Z741 Need for assistance with personal care: Secondary | ICD-10-CM | POA: Diagnosis not present

## 2022-10-10 DIAGNOSIS — M24542 Contracture, left hand: Secondary | ICD-10-CM | POA: Diagnosis not present

## 2022-10-11 DIAGNOSIS — M24541 Contracture, right hand: Secondary | ICD-10-CM | POA: Diagnosis not present

## 2022-10-11 DIAGNOSIS — M24542 Contracture, left hand: Secondary | ICD-10-CM | POA: Diagnosis not present

## 2022-10-11 DIAGNOSIS — Z741 Need for assistance with personal care: Secondary | ICD-10-CM | POA: Diagnosis not present

## 2022-10-12 DIAGNOSIS — M24541 Contracture, right hand: Secondary | ICD-10-CM | POA: Diagnosis not present

## 2022-10-12 DIAGNOSIS — M24542 Contracture, left hand: Secondary | ICD-10-CM | POA: Diagnosis not present

## 2022-10-12 DIAGNOSIS — Z741 Need for assistance with personal care: Secondary | ICD-10-CM | POA: Diagnosis not present

## 2022-10-15 DIAGNOSIS — Z741 Need for assistance with personal care: Secondary | ICD-10-CM | POA: Diagnosis not present

## 2022-10-15 DIAGNOSIS — M24542 Contracture, left hand: Secondary | ICD-10-CM | POA: Diagnosis not present

## 2022-10-15 DIAGNOSIS — M24541 Contracture, right hand: Secondary | ICD-10-CM | POA: Diagnosis not present

## 2022-10-16 DIAGNOSIS — Z741 Need for assistance with personal care: Secondary | ICD-10-CM | POA: Diagnosis not present

## 2022-10-16 DIAGNOSIS — M24542 Contracture, left hand: Secondary | ICD-10-CM | POA: Diagnosis not present

## 2022-10-16 DIAGNOSIS — M24541 Contracture, right hand: Secondary | ICD-10-CM | POA: Diagnosis not present

## 2022-10-17 DIAGNOSIS — B351 Tinea unguium: Secondary | ICD-10-CM | POA: Diagnosis not present

## 2022-10-17 DIAGNOSIS — R059 Cough, unspecified: Secondary | ICD-10-CM | POA: Diagnosis not present

## 2022-10-17 DIAGNOSIS — I739 Peripheral vascular disease, unspecified: Secondary | ICD-10-CM | POA: Diagnosis not present

## 2022-10-17 DIAGNOSIS — M24541 Contracture, right hand: Secondary | ICD-10-CM | POA: Diagnosis not present

## 2022-10-17 DIAGNOSIS — M2042 Other hammer toe(s) (acquired), left foot: Secondary | ICD-10-CM | POA: Diagnosis not present

## 2022-10-17 DIAGNOSIS — L600 Ingrowing nail: Secondary | ICD-10-CM | POA: Diagnosis not present

## 2022-10-17 DIAGNOSIS — M24542 Contracture, left hand: Secondary | ICD-10-CM | POA: Diagnosis not present

## 2022-10-17 DIAGNOSIS — R0989 Other specified symptoms and signs involving the circulatory and respiratory systems: Secondary | ICD-10-CM | POA: Diagnosis not present

## 2022-10-17 DIAGNOSIS — M2041 Other hammer toe(s) (acquired), right foot: Secondary | ICD-10-CM | POA: Diagnosis not present

## 2022-10-17 DIAGNOSIS — Z741 Need for assistance with personal care: Secondary | ICD-10-CM | POA: Diagnosis not present

## 2022-10-18 DIAGNOSIS — Z741 Need for assistance with personal care: Secondary | ICD-10-CM | POA: Diagnosis not present

## 2022-10-18 DIAGNOSIS — M24541 Contracture, right hand: Secondary | ICD-10-CM | POA: Diagnosis not present

## 2022-10-18 DIAGNOSIS — M24542 Contracture, left hand: Secondary | ICD-10-CM | POA: Diagnosis not present

## 2022-10-18 DIAGNOSIS — R0981 Nasal congestion: Secondary | ICD-10-CM | POA: Diagnosis not present

## 2022-10-19 DIAGNOSIS — Z741 Need for assistance with personal care: Secondary | ICD-10-CM | POA: Diagnosis not present

## 2022-10-19 DIAGNOSIS — M24542 Contracture, left hand: Secondary | ICD-10-CM | POA: Diagnosis not present

## 2022-10-19 DIAGNOSIS — M24541 Contracture, right hand: Secondary | ICD-10-CM | POA: Diagnosis not present

## 2022-10-22 DIAGNOSIS — M24542 Contracture, left hand: Secondary | ICD-10-CM | POA: Diagnosis not present

## 2022-10-22 DIAGNOSIS — Z741 Need for assistance with personal care: Secondary | ICD-10-CM | POA: Diagnosis not present

## 2022-10-22 DIAGNOSIS — M24541 Contracture, right hand: Secondary | ICD-10-CM | POA: Diagnosis not present

## 2022-10-23 DIAGNOSIS — M24541 Contracture, right hand: Secondary | ICD-10-CM | POA: Diagnosis not present

## 2022-10-23 DIAGNOSIS — M24542 Contracture, left hand: Secondary | ICD-10-CM | POA: Diagnosis not present

## 2022-10-23 DIAGNOSIS — Z741 Need for assistance with personal care: Secondary | ICD-10-CM | POA: Diagnosis not present

## 2022-10-24 DIAGNOSIS — M24541 Contracture, right hand: Secondary | ICD-10-CM | POA: Diagnosis not present

## 2022-10-24 DIAGNOSIS — M24542 Contracture, left hand: Secondary | ICD-10-CM | POA: Diagnosis not present

## 2022-10-24 DIAGNOSIS — Z741 Need for assistance with personal care: Secondary | ICD-10-CM | POA: Diagnosis not present

## 2022-10-25 DIAGNOSIS — M24541 Contracture, right hand: Secondary | ICD-10-CM | POA: Diagnosis not present

## 2022-10-25 DIAGNOSIS — Z741 Need for assistance with personal care: Secondary | ICD-10-CM | POA: Diagnosis not present

## 2022-10-25 DIAGNOSIS — M24542 Contracture, left hand: Secondary | ICD-10-CM | POA: Diagnosis not present

## 2022-10-26 DIAGNOSIS — M24542 Contracture, left hand: Secondary | ICD-10-CM | POA: Diagnosis not present

## 2022-10-26 DIAGNOSIS — Z741 Need for assistance with personal care: Secondary | ICD-10-CM | POA: Diagnosis not present

## 2022-10-26 DIAGNOSIS — M24541 Contracture, right hand: Secondary | ICD-10-CM | POA: Diagnosis not present

## 2022-10-30 DIAGNOSIS — M24542 Contracture, left hand: Secondary | ICD-10-CM | POA: Diagnosis not present

## 2022-10-30 DIAGNOSIS — Z741 Need for assistance with personal care: Secondary | ICD-10-CM | POA: Diagnosis not present

## 2022-10-30 DIAGNOSIS — M24541 Contracture, right hand: Secondary | ICD-10-CM | POA: Diagnosis not present

## 2022-10-31 DIAGNOSIS — E1165 Type 2 diabetes mellitus with hyperglycemia: Secondary | ICD-10-CM | POA: Diagnosis not present

## 2022-10-31 DIAGNOSIS — Z79899 Other long term (current) drug therapy: Secondary | ICD-10-CM | POA: Diagnosis not present

## 2022-10-31 DIAGNOSIS — R799 Abnormal finding of blood chemistry, unspecified: Secondary | ICD-10-CM | POA: Diagnosis not present

## 2022-11-01 DIAGNOSIS — M24542 Contracture, left hand: Secondary | ICD-10-CM | POA: Diagnosis not present

## 2022-11-01 DIAGNOSIS — M24541 Contracture, right hand: Secondary | ICD-10-CM | POA: Diagnosis not present

## 2022-11-01 DIAGNOSIS — I10 Essential (primary) hypertension: Secondary | ICD-10-CM | POA: Diagnosis not present

## 2022-11-01 DIAGNOSIS — Z741 Need for assistance with personal care: Secondary | ICD-10-CM | POA: Diagnosis not present

## 2022-11-03 DIAGNOSIS — Z741 Need for assistance with personal care: Secondary | ICD-10-CM | POA: Diagnosis not present

## 2022-11-03 DIAGNOSIS — M24541 Contracture, right hand: Secondary | ICD-10-CM | POA: Diagnosis not present

## 2022-11-05 DIAGNOSIS — Z741 Need for assistance with personal care: Secondary | ICD-10-CM | POA: Diagnosis not present

## 2022-11-05 DIAGNOSIS — M24541 Contracture, right hand: Secondary | ICD-10-CM | POA: Diagnosis not present

## 2022-11-06 DIAGNOSIS — M24541 Contracture, right hand: Secondary | ICD-10-CM | POA: Diagnosis not present

## 2022-11-06 DIAGNOSIS — Z741 Need for assistance with personal care: Secondary | ICD-10-CM | POA: Diagnosis not present

## 2022-11-08 DIAGNOSIS — M24541 Contracture, right hand: Secondary | ICD-10-CM | POA: Diagnosis not present

## 2022-11-08 DIAGNOSIS — Z741 Need for assistance with personal care: Secondary | ICD-10-CM | POA: Diagnosis not present

## 2022-11-09 DIAGNOSIS — M24541 Contracture, right hand: Secondary | ICD-10-CM | POA: Diagnosis not present

## 2022-11-09 DIAGNOSIS — Z741 Need for assistance with personal care: Secondary | ICD-10-CM | POA: Diagnosis not present

## 2022-11-12 DIAGNOSIS — Z741 Need for assistance with personal care: Secondary | ICD-10-CM | POA: Diagnosis not present

## 2022-11-12 DIAGNOSIS — M24541 Contracture, right hand: Secondary | ICD-10-CM | POA: Diagnosis not present

## 2022-11-15 DIAGNOSIS — M24541 Contracture, right hand: Secondary | ICD-10-CM | POA: Diagnosis not present

## 2022-11-15 DIAGNOSIS — Z741 Need for assistance with personal care: Secondary | ICD-10-CM | POA: Diagnosis not present

## 2022-11-16 DIAGNOSIS — M24541 Contracture, right hand: Secondary | ICD-10-CM | POA: Diagnosis not present

## 2022-11-16 DIAGNOSIS — Z741 Need for assistance with personal care: Secondary | ICD-10-CM | POA: Diagnosis not present

## 2022-11-19 DIAGNOSIS — M24541 Contracture, right hand: Secondary | ICD-10-CM | POA: Diagnosis not present

## 2022-11-19 DIAGNOSIS — Z741 Need for assistance with personal care: Secondary | ICD-10-CM | POA: Diagnosis not present

## 2022-11-20 DIAGNOSIS — M24541 Contracture, right hand: Secondary | ICD-10-CM | POA: Diagnosis not present

## 2022-11-20 DIAGNOSIS — Z741 Need for assistance with personal care: Secondary | ICD-10-CM | POA: Diagnosis not present

## 2022-11-21 DIAGNOSIS — Z741 Need for assistance with personal care: Secondary | ICD-10-CM | POA: Diagnosis not present

## 2022-11-21 DIAGNOSIS — M24541 Contracture, right hand: Secondary | ICD-10-CM | POA: Diagnosis not present

## 2022-12-21 DIAGNOSIS — E1165 Type 2 diabetes mellitus with hyperglycemia: Secondary | ICD-10-CM | POA: Diagnosis not present

## 2023-01-03 DIAGNOSIS — J449 Chronic obstructive pulmonary disease, unspecified: Secondary | ICD-10-CM | POA: Diagnosis not present

## 2023-01-03 DIAGNOSIS — Z79899 Other long term (current) drug therapy: Secondary | ICD-10-CM | POA: Diagnosis not present

## 2023-01-03 DIAGNOSIS — E1165 Type 2 diabetes mellitus with hyperglycemia: Secondary | ICD-10-CM | POA: Diagnosis not present

## 2023-01-25 ENCOUNTER — Ambulatory Visit (INDEPENDENT_AMBULATORY_CARE_PROVIDER_SITE_OTHER): Payer: 59 | Admitting: Internal Medicine

## 2023-01-25 ENCOUNTER — Other Ambulatory Visit: Payer: Self-pay

## 2023-01-25 ENCOUNTER — Encounter: Payer: Self-pay | Admitting: Internal Medicine

## 2023-01-25 VITALS — BP 104/71 | HR 57 | Resp 16 | Ht 63.0 in | Wt 180.0 lb

## 2023-01-25 DIAGNOSIS — B20 Human immunodeficiency virus [HIV] disease: Secondary | ICD-10-CM

## 2023-01-25 NOTE — Progress Notes (Signed)
   Subjective:    Patient ID: Susan [name], female    DOB: 1961-04-27, 62 y.o.   MRN: [id number]  HPI Susan [name] is here for her routine follow up She continues on Triumeq in the am and Tivicay alone in the pm due to concominant phenobarbital.  She has had no issues.  She remains in the same facility.  No new issues.     Review of Systems  Constitutional:  Negative for fatigue.  Gastrointestinal:  Negative for diarrhea and nausea.  Skin:  Negative for rash.       Objective:   Physical Exam Eyes:     General: No scleral icterus. Pulmonary:     Effort: Pulmonary effort is normal.  Neurological:     Mental Status: She is alert.    SH: + tobacco       Assessment & Plan:

## 2023-01-25 NOTE — Assessment & Plan Note (Signed)
She is doing well on her regimen adjusted for phenobarbital and no issues.  Will check her labs today and she can rtc in about 6 months.   Spent time reflecting on her children who come to visit and her medical care.   I have personally spent 30 minutes involved in face-to-face and non-face-to-face activities for this patient on the day of the visit. Professional time spent includes the following activities: Preparing to see the patient (review of tests), Obtaining and/or reviewing separately obtained history (admission/discharge record), Performing a medically appropriate examination and/or evaluation , Ordering medications/tests/procedures, referring and communicating with other health care professionals, Documenting clinical information in the EMR, Independently interpreting results (not separately reported), Communicating results to the patient/family/caregiver, Counseling and educating the patient/family/caregiver and Care coordination (not separately reported).

## 2023-01-29 DIAGNOSIS — R799 Abnormal finding of blood chemistry, unspecified: Secondary | ICD-10-CM | POA: Diagnosis not present

## 2023-01-29 DIAGNOSIS — Z79899 Other long term (current) drug therapy: Secondary | ICD-10-CM | POA: Diagnosis not present

## 2023-01-29 DIAGNOSIS — E1165 Type 2 diabetes mellitus with hyperglycemia: Secondary | ICD-10-CM | POA: Diagnosis not present

## 2023-01-29 LAB — HIV-1 RNA QUANT-NO REFLEX-BLD
HIV 1 RNA Quant: NOT DETECTED Copies/mL
HIV-1 RNA Quant, Log: NOT DETECTED Log cps/mL

## 2023-01-29 LAB — T-HELPER CELLS (CD4) COUNT (NOT AT ARMC)
Absolute CD4: 772 cells/uL (ref 490–1740)
CD4 T Helper %: 29 % — ABNORMAL LOW (ref 30–61)
Total lymphocyte count: 2674 cells/uL (ref 850–3900)

## 2023-01-31 DIAGNOSIS — E1165 Type 2 diabetes mellitus with hyperglycemia: Secondary | ICD-10-CM | POA: Diagnosis not present

## 2023-02-06 DIAGNOSIS — Z741 Need for assistance with personal care: Secondary | ICD-10-CM | POA: Diagnosis not present

## 2023-02-06 DIAGNOSIS — R278 Other lack of coordination: Secondary | ICD-10-CM | POA: Diagnosis not present

## 2023-02-06 DIAGNOSIS — M24541 Contracture, right hand: Secondary | ICD-10-CM | POA: Diagnosis not present

## 2023-02-06 DIAGNOSIS — M24542 Contracture, left hand: Secondary | ICD-10-CM | POA: Diagnosis not present

## 2023-02-08 DIAGNOSIS — M24541 Contracture, right hand: Secondary | ICD-10-CM | POA: Diagnosis not present

## 2023-02-08 DIAGNOSIS — R278 Other lack of coordination: Secondary | ICD-10-CM | POA: Diagnosis not present

## 2023-02-08 DIAGNOSIS — Z741 Need for assistance with personal care: Secondary | ICD-10-CM | POA: Diagnosis not present

## 2023-02-08 DIAGNOSIS — M24542 Contracture, left hand: Secondary | ICD-10-CM | POA: Diagnosis not present

## 2023-02-09 DIAGNOSIS — Z741 Need for assistance with personal care: Secondary | ICD-10-CM | POA: Diagnosis not present

## 2023-02-09 DIAGNOSIS — M24542 Contracture, left hand: Secondary | ICD-10-CM | POA: Diagnosis not present

## 2023-02-09 DIAGNOSIS — R278 Other lack of coordination: Secondary | ICD-10-CM | POA: Diagnosis not present

## 2023-02-09 DIAGNOSIS — M24541 Contracture, right hand: Secondary | ICD-10-CM | POA: Diagnosis not present

## 2023-02-11 DIAGNOSIS — M24542 Contracture, left hand: Secondary | ICD-10-CM | POA: Diagnosis not present

## 2023-02-11 DIAGNOSIS — Z741 Need for assistance with personal care: Secondary | ICD-10-CM | POA: Diagnosis not present

## 2023-02-11 DIAGNOSIS — R278 Other lack of coordination: Secondary | ICD-10-CM | POA: Diagnosis not present

## 2023-02-11 DIAGNOSIS — M24541 Contracture, right hand: Secondary | ICD-10-CM | POA: Diagnosis not present

## 2023-02-12 ENCOUNTER — Encounter: Payer: Self-pay | Admitting: Internal Medicine

## 2023-02-12 DIAGNOSIS — M24541 Contracture, right hand: Secondary | ICD-10-CM | POA: Diagnosis not present

## 2023-02-12 DIAGNOSIS — M24542 Contracture, left hand: Secondary | ICD-10-CM | POA: Diagnosis not present

## 2023-02-12 DIAGNOSIS — R278 Other lack of coordination: Secondary | ICD-10-CM | POA: Diagnosis not present

## 2023-02-12 DIAGNOSIS — Z741 Need for assistance with personal care: Secondary | ICD-10-CM | POA: Diagnosis not present

## 2023-02-13 DIAGNOSIS — Z741 Need for assistance with personal care: Secondary | ICD-10-CM | POA: Diagnosis not present

## 2023-02-13 DIAGNOSIS — R278 Other lack of coordination: Secondary | ICD-10-CM | POA: Diagnosis not present

## 2023-02-13 DIAGNOSIS — M24542 Contracture, left hand: Secondary | ICD-10-CM | POA: Diagnosis not present

## 2023-02-13 DIAGNOSIS — M24541 Contracture, right hand: Secondary | ICD-10-CM | POA: Diagnosis not present

## 2023-02-14 DIAGNOSIS — R278 Other lack of coordination: Secondary | ICD-10-CM | POA: Diagnosis not present

## 2023-02-14 DIAGNOSIS — M24542 Contracture, left hand: Secondary | ICD-10-CM | POA: Diagnosis not present

## 2023-02-14 DIAGNOSIS — Z741 Need for assistance with personal care: Secondary | ICD-10-CM | POA: Diagnosis not present

## 2023-02-14 DIAGNOSIS — M24541 Contracture, right hand: Secondary | ICD-10-CM | POA: Diagnosis not present

## 2023-02-16 DIAGNOSIS — M24541 Contracture, right hand: Secondary | ICD-10-CM | POA: Diagnosis not present

## 2023-02-16 DIAGNOSIS — M24542 Contracture, left hand: Secondary | ICD-10-CM | POA: Diagnosis not present

## 2023-02-16 DIAGNOSIS — R278 Other lack of coordination: Secondary | ICD-10-CM | POA: Diagnosis not present

## 2023-02-16 DIAGNOSIS — Z741 Need for assistance with personal care: Secondary | ICD-10-CM | POA: Diagnosis not present

## 2023-02-18 DIAGNOSIS — M24542 Contracture, left hand: Secondary | ICD-10-CM | POA: Diagnosis not present

## 2023-02-18 DIAGNOSIS — R278 Other lack of coordination: Secondary | ICD-10-CM | POA: Diagnosis not present

## 2023-02-18 DIAGNOSIS — Z741 Need for assistance with personal care: Secondary | ICD-10-CM | POA: Diagnosis not present

## 2023-02-18 DIAGNOSIS — M24541 Contracture, right hand: Secondary | ICD-10-CM | POA: Diagnosis not present

## 2023-02-19 DIAGNOSIS — R278 Other lack of coordination: Secondary | ICD-10-CM | POA: Diagnosis not present

## 2023-02-19 DIAGNOSIS — M24542 Contracture, left hand: Secondary | ICD-10-CM | POA: Diagnosis not present

## 2023-02-19 DIAGNOSIS — M24541 Contracture, right hand: Secondary | ICD-10-CM | POA: Diagnosis not present

## 2023-02-19 DIAGNOSIS — Z741 Need for assistance with personal care: Secondary | ICD-10-CM | POA: Diagnosis not present

## 2023-02-20 DIAGNOSIS — M24542 Contracture, left hand: Secondary | ICD-10-CM | POA: Diagnosis not present

## 2023-02-20 DIAGNOSIS — Z741 Need for assistance with personal care: Secondary | ICD-10-CM | POA: Diagnosis not present

## 2023-02-20 DIAGNOSIS — M24541 Contracture, right hand: Secondary | ICD-10-CM | POA: Diagnosis not present

## 2023-02-20 DIAGNOSIS — R278 Other lack of coordination: Secondary | ICD-10-CM | POA: Diagnosis not present

## 2023-02-21 DIAGNOSIS — M24542 Contracture, left hand: Secondary | ICD-10-CM | POA: Diagnosis not present

## 2023-02-21 DIAGNOSIS — R278 Other lack of coordination: Secondary | ICD-10-CM | POA: Diagnosis not present

## 2023-02-21 DIAGNOSIS — Z741 Need for assistance with personal care: Secondary | ICD-10-CM | POA: Diagnosis not present

## 2023-02-21 DIAGNOSIS — M24541 Contracture, right hand: Secondary | ICD-10-CM | POA: Diagnosis not present

## 2023-02-22 DIAGNOSIS — M24541 Contracture, right hand: Secondary | ICD-10-CM | POA: Diagnosis not present

## 2023-02-22 DIAGNOSIS — R278 Other lack of coordination: Secondary | ICD-10-CM | POA: Diagnosis not present

## 2023-02-22 DIAGNOSIS — Z741 Need for assistance with personal care: Secondary | ICD-10-CM | POA: Diagnosis not present

## 2023-02-22 DIAGNOSIS — M24542 Contracture, left hand: Secondary | ICD-10-CM | POA: Diagnosis not present

## 2023-02-25 DIAGNOSIS — R278 Other lack of coordination: Secondary | ICD-10-CM | POA: Diagnosis not present

## 2023-02-25 DIAGNOSIS — Z741 Need for assistance with personal care: Secondary | ICD-10-CM | POA: Diagnosis not present

## 2023-02-25 DIAGNOSIS — M24542 Contracture, left hand: Secondary | ICD-10-CM | POA: Diagnosis not present

## 2023-02-25 DIAGNOSIS — M24541 Contracture, right hand: Secondary | ICD-10-CM | POA: Diagnosis not present

## 2023-02-26 DIAGNOSIS — Z741 Need for assistance with personal care: Secondary | ICD-10-CM | POA: Diagnosis not present

## 2023-02-26 DIAGNOSIS — M24541 Contracture, right hand: Secondary | ICD-10-CM | POA: Diagnosis not present

## 2023-02-26 DIAGNOSIS — R278 Other lack of coordination: Secondary | ICD-10-CM | POA: Diagnosis not present

## 2023-02-26 DIAGNOSIS — M24542 Contracture, left hand: Secondary | ICD-10-CM | POA: Diagnosis not present

## 2023-02-27 DIAGNOSIS — R278 Other lack of coordination: Secondary | ICD-10-CM | POA: Diagnosis not present

## 2023-02-27 DIAGNOSIS — M24541 Contracture, right hand: Secondary | ICD-10-CM | POA: Diagnosis not present

## 2023-02-27 DIAGNOSIS — M24542 Contracture, left hand: Secondary | ICD-10-CM | POA: Diagnosis not present

## 2023-02-27 DIAGNOSIS — Z741 Need for assistance with personal care: Secondary | ICD-10-CM | POA: Diagnosis not present

## 2023-02-27 DIAGNOSIS — H21541 Posterior synechiae (iris), right eye: Secondary | ICD-10-CM | POA: Diagnosis not present

## 2023-02-27 DIAGNOSIS — H25813 Combined forms of age-related cataract, bilateral: Secondary | ICD-10-CM | POA: Diagnosis not present

## 2023-02-27 DIAGNOSIS — E119 Type 2 diabetes mellitus without complications: Secondary | ICD-10-CM | POA: Diagnosis not present

## 2023-02-28 DIAGNOSIS — M24542 Contracture, left hand: Secondary | ICD-10-CM | POA: Diagnosis not present

## 2023-02-28 DIAGNOSIS — Z741 Need for assistance with personal care: Secondary | ICD-10-CM | POA: Diagnosis not present

## 2023-02-28 DIAGNOSIS — M24541 Contracture, right hand: Secondary | ICD-10-CM | POA: Diagnosis not present

## 2023-02-28 DIAGNOSIS — R278 Other lack of coordination: Secondary | ICD-10-CM | POA: Diagnosis not present

## 2023-03-01 DIAGNOSIS — Z741 Need for assistance with personal care: Secondary | ICD-10-CM | POA: Diagnosis not present

## 2023-03-01 DIAGNOSIS — M24542 Contracture, left hand: Secondary | ICD-10-CM | POA: Diagnosis not present

## 2023-03-01 DIAGNOSIS — R278 Other lack of coordination: Secondary | ICD-10-CM | POA: Diagnosis not present

## 2023-03-01 DIAGNOSIS — M24541 Contracture, right hand: Secondary | ICD-10-CM | POA: Diagnosis not present

## 2023-03-04 DIAGNOSIS — E11649 Type 2 diabetes mellitus with hypoglycemia without coma: Secondary | ICD-10-CM | POA: Diagnosis not present

## 2023-03-19 DIAGNOSIS — L120 Bullous pemphigoid: Secondary | ICD-10-CM | POA: Diagnosis not present

## 2023-04-03 DIAGNOSIS — F32A Depression, unspecified: Secondary | ICD-10-CM | POA: Diagnosis not present

## 2023-04-03 DIAGNOSIS — I10 Essential (primary) hypertension: Secondary | ICD-10-CM | POA: Diagnosis not present

## 2023-04-29 DIAGNOSIS — Z79899 Other long term (current) drug therapy: Secondary | ICD-10-CM | POA: Diagnosis not present

## 2023-04-29 DIAGNOSIS — R799 Abnormal finding of blood chemistry, unspecified: Secondary | ICD-10-CM | POA: Diagnosis not present

## 2023-04-29 DIAGNOSIS — E1165 Type 2 diabetes mellitus with hyperglycemia: Secondary | ICD-10-CM | POA: Diagnosis not present

## 2023-04-30 DIAGNOSIS — I10 Essential (primary) hypertension: Secondary | ICD-10-CM | POA: Diagnosis not present

## 2023-05-01 DIAGNOSIS — Z79899 Other long term (current) drug therapy: Secondary | ICD-10-CM | POA: Diagnosis not present

## 2023-05-01 DIAGNOSIS — E1165 Type 2 diabetes mellitus with hyperglycemia: Secondary | ICD-10-CM | POA: Diagnosis not present

## 2023-05-02 DIAGNOSIS — E1165 Type 2 diabetes mellitus with hyperglycemia: Secondary | ICD-10-CM | POA: Diagnosis not present

## 2023-05-11 DIAGNOSIS — M6281 Muscle weakness (generalized): Secondary | ICD-10-CM | POA: Diagnosis not present

## 2023-05-11 DIAGNOSIS — R2689 Other abnormalities of gait and mobility: Secondary | ICD-10-CM | POA: Diagnosis not present

## 2023-05-11 DIAGNOSIS — E1165 Type 2 diabetes mellitus with hyperglycemia: Secondary | ICD-10-CM | POA: Diagnosis not present

## 2023-05-13 DIAGNOSIS — E1165 Type 2 diabetes mellitus with hyperglycemia: Secondary | ICD-10-CM | POA: Diagnosis not present

## 2023-05-13 DIAGNOSIS — R2689 Other abnormalities of gait and mobility: Secondary | ICD-10-CM | POA: Diagnosis not present

## 2023-05-13 DIAGNOSIS — M6281 Muscle weakness (generalized): Secondary | ICD-10-CM | POA: Diagnosis not present

## 2023-05-14 DIAGNOSIS — M6281 Muscle weakness (generalized): Secondary | ICD-10-CM | POA: Diagnosis not present

## 2023-05-14 DIAGNOSIS — R2689 Other abnormalities of gait and mobility: Secondary | ICD-10-CM | POA: Diagnosis not present

## 2023-05-14 DIAGNOSIS — E1165 Type 2 diabetes mellitus with hyperglycemia: Secondary | ICD-10-CM | POA: Diagnosis not present

## 2023-05-15 DIAGNOSIS — R2689 Other abnormalities of gait and mobility: Secondary | ICD-10-CM | POA: Diagnosis not present

## 2023-05-15 DIAGNOSIS — M6281 Muscle weakness (generalized): Secondary | ICD-10-CM | POA: Diagnosis not present

## 2023-05-15 DIAGNOSIS — E1165 Type 2 diabetes mellitus with hyperglycemia: Secondary | ICD-10-CM | POA: Diagnosis not present

## 2023-05-16 DIAGNOSIS — M6281 Muscle weakness (generalized): Secondary | ICD-10-CM | POA: Diagnosis not present

## 2023-05-16 DIAGNOSIS — R2689 Other abnormalities of gait and mobility: Secondary | ICD-10-CM | POA: Diagnosis not present

## 2023-05-16 DIAGNOSIS — E1165 Type 2 diabetes mellitus with hyperglycemia: Secondary | ICD-10-CM | POA: Diagnosis not present

## 2023-05-18 DIAGNOSIS — R2689 Other abnormalities of gait and mobility: Secondary | ICD-10-CM | POA: Diagnosis not present

## 2023-05-18 DIAGNOSIS — M6281 Muscle weakness (generalized): Secondary | ICD-10-CM | POA: Diagnosis not present

## 2023-05-18 DIAGNOSIS — E1165 Type 2 diabetes mellitus with hyperglycemia: Secondary | ICD-10-CM | POA: Diagnosis not present

## 2023-05-20 DIAGNOSIS — E1165 Type 2 diabetes mellitus with hyperglycemia: Secondary | ICD-10-CM | POA: Diagnosis not present

## 2023-05-20 DIAGNOSIS — M6281 Muscle weakness (generalized): Secondary | ICD-10-CM | POA: Diagnosis not present

## 2023-05-20 DIAGNOSIS — R2689 Other abnormalities of gait and mobility: Secondary | ICD-10-CM | POA: Diagnosis not present

## 2023-05-21 DIAGNOSIS — E1165 Type 2 diabetes mellitus with hyperglycemia: Secondary | ICD-10-CM | POA: Diagnosis not present

## 2023-05-21 DIAGNOSIS — M6281 Muscle weakness (generalized): Secondary | ICD-10-CM | POA: Diagnosis not present

## 2023-05-21 DIAGNOSIS — R2689 Other abnormalities of gait and mobility: Secondary | ICD-10-CM | POA: Diagnosis not present

## 2023-05-22 DIAGNOSIS — R2689 Other abnormalities of gait and mobility: Secondary | ICD-10-CM | POA: Diagnosis not present

## 2023-05-22 DIAGNOSIS — M6281 Muscle weakness (generalized): Secondary | ICD-10-CM | POA: Diagnosis not present

## 2023-05-22 DIAGNOSIS — I10 Essential (primary) hypertension: Secondary | ICD-10-CM | POA: Diagnosis not present

## 2023-05-22 DIAGNOSIS — E1165 Type 2 diabetes mellitus with hyperglycemia: Secondary | ICD-10-CM | POA: Diagnosis not present

## 2023-05-24 DIAGNOSIS — E1165 Type 2 diabetes mellitus with hyperglycemia: Secondary | ICD-10-CM | POA: Diagnosis not present

## 2023-05-24 DIAGNOSIS — M6281 Muscle weakness (generalized): Secondary | ICD-10-CM | POA: Diagnosis not present

## 2023-05-24 DIAGNOSIS — R2689 Other abnormalities of gait and mobility: Secondary | ICD-10-CM | POA: Diagnosis not present

## 2023-05-25 DIAGNOSIS — M6281 Muscle weakness (generalized): Secondary | ICD-10-CM | POA: Diagnosis not present

## 2023-05-25 DIAGNOSIS — E1165 Type 2 diabetes mellitus with hyperglycemia: Secondary | ICD-10-CM | POA: Diagnosis not present

## 2023-05-25 DIAGNOSIS — R2689 Other abnormalities of gait and mobility: Secondary | ICD-10-CM | POA: Diagnosis not present

## 2023-05-27 DIAGNOSIS — M6281 Muscle weakness (generalized): Secondary | ICD-10-CM | POA: Diagnosis not present

## 2023-05-27 DIAGNOSIS — R2689 Other abnormalities of gait and mobility: Secondary | ICD-10-CM | POA: Diagnosis not present

## 2023-05-27 DIAGNOSIS — E1165 Type 2 diabetes mellitus with hyperglycemia: Secondary | ICD-10-CM | POA: Diagnosis not present

## 2023-05-28 DIAGNOSIS — E1165 Type 2 diabetes mellitus with hyperglycemia: Secondary | ICD-10-CM | POA: Diagnosis not present

## 2023-05-28 DIAGNOSIS — M6281 Muscle weakness (generalized): Secondary | ICD-10-CM | POA: Diagnosis not present

## 2023-05-28 DIAGNOSIS — R2689 Other abnormalities of gait and mobility: Secondary | ICD-10-CM | POA: Diagnosis not present

## 2023-05-29 DIAGNOSIS — E1165 Type 2 diabetes mellitus with hyperglycemia: Secondary | ICD-10-CM | POA: Diagnosis not present

## 2023-05-29 DIAGNOSIS — L84 Corns and callosities: Secondary | ICD-10-CM | POA: Diagnosis not present

## 2023-05-29 DIAGNOSIS — L602 Onychogryphosis: Secondary | ICD-10-CM | POA: Diagnosis not present

## 2023-05-29 DIAGNOSIS — L603 Nail dystrophy: Secondary | ICD-10-CM | POA: Diagnosis not present

## 2023-05-29 DIAGNOSIS — E1151 Type 2 diabetes mellitus with diabetic peripheral angiopathy without gangrene: Secondary | ICD-10-CM | POA: Diagnosis not present

## 2023-05-29 DIAGNOSIS — R2689 Other abnormalities of gait and mobility: Secondary | ICD-10-CM | POA: Diagnosis not present

## 2023-05-29 DIAGNOSIS — M6281 Muscle weakness (generalized): Secondary | ICD-10-CM | POA: Diagnosis not present

## 2023-05-29 DIAGNOSIS — Z794 Long term (current) use of insulin: Secondary | ICD-10-CM | POA: Diagnosis not present

## 2023-05-30 DIAGNOSIS — R2689 Other abnormalities of gait and mobility: Secondary | ICD-10-CM | POA: Diagnosis not present

## 2023-05-30 DIAGNOSIS — E1165 Type 2 diabetes mellitus with hyperglycemia: Secondary | ICD-10-CM | POA: Diagnosis not present

## 2023-05-30 DIAGNOSIS — M6281 Muscle weakness (generalized): Secondary | ICD-10-CM | POA: Diagnosis not present

## 2023-05-31 DIAGNOSIS — R2689 Other abnormalities of gait and mobility: Secondary | ICD-10-CM | POA: Diagnosis not present

## 2023-05-31 DIAGNOSIS — M6281 Muscle weakness (generalized): Secondary | ICD-10-CM | POA: Diagnosis not present

## 2023-05-31 DIAGNOSIS — E1165 Type 2 diabetes mellitus with hyperglycemia: Secondary | ICD-10-CM | POA: Diagnosis not present

## 2023-06-01 DIAGNOSIS — M6281 Muscle weakness (generalized): Secondary | ICD-10-CM | POA: Diagnosis not present

## 2023-06-01 DIAGNOSIS — R2689 Other abnormalities of gait and mobility: Secondary | ICD-10-CM | POA: Diagnosis not present

## 2023-06-01 DIAGNOSIS — E1165 Type 2 diabetes mellitus with hyperglycemia: Secondary | ICD-10-CM | POA: Diagnosis not present

## 2023-06-03 DIAGNOSIS — E1165 Type 2 diabetes mellitus with hyperglycemia: Secondary | ICD-10-CM | POA: Diagnosis not present

## 2023-06-03 DIAGNOSIS — M6281 Muscle weakness (generalized): Secondary | ICD-10-CM | POA: Diagnosis not present

## 2023-06-03 DIAGNOSIS — R2689 Other abnormalities of gait and mobility: Secondary | ICD-10-CM | POA: Diagnosis not present

## 2023-06-04 DIAGNOSIS — R2689 Other abnormalities of gait and mobility: Secondary | ICD-10-CM | POA: Diagnosis not present

## 2023-06-04 DIAGNOSIS — E1165 Type 2 diabetes mellitus with hyperglycemia: Secondary | ICD-10-CM | POA: Diagnosis not present

## 2023-06-04 DIAGNOSIS — M6281 Muscle weakness (generalized): Secondary | ICD-10-CM | POA: Diagnosis not present

## 2023-06-05 DIAGNOSIS — E1165 Type 2 diabetes mellitus with hyperglycemia: Secondary | ICD-10-CM | POA: Diagnosis not present

## 2023-06-05 DIAGNOSIS — M6281 Muscle weakness (generalized): Secondary | ICD-10-CM | POA: Diagnosis not present

## 2023-06-05 DIAGNOSIS — R2689 Other abnormalities of gait and mobility: Secondary | ICD-10-CM | POA: Diagnosis not present

## 2023-06-06 DIAGNOSIS — J209 Acute bronchitis, unspecified: Secondary | ICD-10-CM | POA: Diagnosis not present

## 2023-06-07 DIAGNOSIS — E1165 Type 2 diabetes mellitus with hyperglycemia: Secondary | ICD-10-CM | POA: Diagnosis not present

## 2023-06-07 DIAGNOSIS — R2689 Other abnormalities of gait and mobility: Secondary | ICD-10-CM | POA: Diagnosis not present

## 2023-06-07 DIAGNOSIS — M6281 Muscle weakness (generalized): Secondary | ICD-10-CM | POA: Diagnosis not present

## 2023-06-08 DIAGNOSIS — M6281 Muscle weakness (generalized): Secondary | ICD-10-CM | POA: Diagnosis not present

## 2023-06-08 DIAGNOSIS — R2689 Other abnormalities of gait and mobility: Secondary | ICD-10-CM | POA: Diagnosis not present

## 2023-06-08 DIAGNOSIS — E1165 Type 2 diabetes mellitus with hyperglycemia: Secondary | ICD-10-CM | POA: Diagnosis not present

## 2023-06-10 DIAGNOSIS — R2689 Other abnormalities of gait and mobility: Secondary | ICD-10-CM | POA: Diagnosis not present

## 2023-06-10 DIAGNOSIS — M6281 Muscle weakness (generalized): Secondary | ICD-10-CM | POA: Diagnosis not present

## 2023-06-10 DIAGNOSIS — E1165 Type 2 diabetes mellitus with hyperglycemia: Secondary | ICD-10-CM | POA: Diagnosis not present

## 2023-06-11 DIAGNOSIS — R2689 Other abnormalities of gait and mobility: Secondary | ICD-10-CM | POA: Diagnosis not present

## 2023-06-11 DIAGNOSIS — M6281 Muscle weakness (generalized): Secondary | ICD-10-CM | POA: Diagnosis not present

## 2023-06-11 DIAGNOSIS — E1165 Type 2 diabetes mellitus with hyperglycemia: Secondary | ICD-10-CM | POA: Diagnosis not present

## 2023-06-12 DIAGNOSIS — R2689 Other abnormalities of gait and mobility: Secondary | ICD-10-CM | POA: Diagnosis not present

## 2023-06-12 DIAGNOSIS — E1165 Type 2 diabetes mellitus with hyperglycemia: Secondary | ICD-10-CM | POA: Diagnosis not present

## 2023-06-12 DIAGNOSIS — M6281 Muscle weakness (generalized): Secondary | ICD-10-CM | POA: Diagnosis not present

## 2023-06-13 DIAGNOSIS — R2689 Other abnormalities of gait and mobility: Secondary | ICD-10-CM | POA: Diagnosis not present

## 2023-06-13 DIAGNOSIS — E1165 Type 2 diabetes mellitus with hyperglycemia: Secondary | ICD-10-CM | POA: Diagnosis not present

## 2023-06-13 DIAGNOSIS — M6281 Muscle weakness (generalized): Secondary | ICD-10-CM | POA: Diagnosis not present

## 2023-06-14 DIAGNOSIS — E1165 Type 2 diabetes mellitus with hyperglycemia: Secondary | ICD-10-CM | POA: Diagnosis not present

## 2023-06-14 DIAGNOSIS — R2689 Other abnormalities of gait and mobility: Secondary | ICD-10-CM | POA: Diagnosis not present

## 2023-06-14 DIAGNOSIS — M6281 Muscle weakness (generalized): Secondary | ICD-10-CM | POA: Diagnosis not present

## 2023-06-17 DIAGNOSIS — M6281 Muscle weakness (generalized): Secondary | ICD-10-CM | POA: Diagnosis not present

## 2023-06-17 DIAGNOSIS — I10 Essential (primary) hypertension: Secondary | ICD-10-CM | POA: Diagnosis not present

## 2023-06-17 DIAGNOSIS — E1165 Type 2 diabetes mellitus with hyperglycemia: Secondary | ICD-10-CM | POA: Diagnosis not present

## 2023-06-17 DIAGNOSIS — R2689 Other abnormalities of gait and mobility: Secondary | ICD-10-CM | POA: Diagnosis not present

## 2023-06-18 DIAGNOSIS — E1165 Type 2 diabetes mellitus with hyperglycemia: Secondary | ICD-10-CM | POA: Diagnosis not present

## 2023-06-18 DIAGNOSIS — M6281 Muscle weakness (generalized): Secondary | ICD-10-CM | POA: Diagnosis not present

## 2023-06-18 DIAGNOSIS — R2689 Other abnormalities of gait and mobility: Secondary | ICD-10-CM | POA: Diagnosis not present

## 2023-06-19 DIAGNOSIS — M6281 Muscle weakness (generalized): Secondary | ICD-10-CM | POA: Diagnosis not present

## 2023-06-19 DIAGNOSIS — E1165 Type 2 diabetes mellitus with hyperglycemia: Secondary | ICD-10-CM | POA: Diagnosis not present

## 2023-06-19 DIAGNOSIS — R2689 Other abnormalities of gait and mobility: Secondary | ICD-10-CM | POA: Diagnosis not present

## 2023-06-20 DIAGNOSIS — R2689 Other abnormalities of gait and mobility: Secondary | ICD-10-CM | POA: Diagnosis not present

## 2023-06-20 DIAGNOSIS — M6281 Muscle weakness (generalized): Secondary | ICD-10-CM | POA: Diagnosis not present

## 2023-06-20 DIAGNOSIS — E1165 Type 2 diabetes mellitus with hyperglycemia: Secondary | ICD-10-CM | POA: Diagnosis not present

## 2023-06-21 DIAGNOSIS — M6281 Muscle weakness (generalized): Secondary | ICD-10-CM | POA: Diagnosis not present

## 2023-06-21 DIAGNOSIS — R2689 Other abnormalities of gait and mobility: Secondary | ICD-10-CM | POA: Diagnosis not present

## 2023-06-21 DIAGNOSIS — E1165 Type 2 diabetes mellitus with hyperglycemia: Secondary | ICD-10-CM | POA: Diagnosis not present

## 2023-06-24 DIAGNOSIS — M6281 Muscle weakness (generalized): Secondary | ICD-10-CM | POA: Diagnosis not present

## 2023-06-24 DIAGNOSIS — R2689 Other abnormalities of gait and mobility: Secondary | ICD-10-CM | POA: Diagnosis not present

## 2023-06-24 DIAGNOSIS — E1165 Type 2 diabetes mellitus with hyperglycemia: Secondary | ICD-10-CM | POA: Diagnosis not present

## 2023-06-25 DIAGNOSIS — R2689 Other abnormalities of gait and mobility: Secondary | ICD-10-CM | POA: Diagnosis not present

## 2023-06-25 DIAGNOSIS — E1165 Type 2 diabetes mellitus with hyperglycemia: Secondary | ICD-10-CM | POA: Diagnosis not present

## 2023-06-25 DIAGNOSIS — M6281 Muscle weakness (generalized): Secondary | ICD-10-CM | POA: Diagnosis not present

## 2023-06-26 DIAGNOSIS — M6281 Muscle weakness (generalized): Secondary | ICD-10-CM | POA: Diagnosis not present

## 2023-06-26 DIAGNOSIS — E1165 Type 2 diabetes mellitus with hyperglycemia: Secondary | ICD-10-CM | POA: Diagnosis not present

## 2023-06-26 DIAGNOSIS — R2689 Other abnormalities of gait and mobility: Secondary | ICD-10-CM | POA: Diagnosis not present

## 2023-06-27 DIAGNOSIS — M6281 Muscle weakness (generalized): Secondary | ICD-10-CM | POA: Diagnosis not present

## 2023-06-27 DIAGNOSIS — R2689 Other abnormalities of gait and mobility: Secondary | ICD-10-CM | POA: Diagnosis not present

## 2023-06-27 DIAGNOSIS — E1165 Type 2 diabetes mellitus with hyperglycemia: Secondary | ICD-10-CM | POA: Diagnosis not present

## 2023-06-28 DIAGNOSIS — E1165 Type 2 diabetes mellitus with hyperglycemia: Secondary | ICD-10-CM | POA: Diagnosis not present

## 2023-06-28 DIAGNOSIS — Z79899 Other long term (current) drug therapy: Secondary | ICD-10-CM | POA: Diagnosis not present

## 2023-06-28 DIAGNOSIS — R2689 Other abnormalities of gait and mobility: Secondary | ICD-10-CM | POA: Diagnosis not present

## 2023-06-28 DIAGNOSIS — M6281 Muscle weakness (generalized): Secondary | ICD-10-CM | POA: Diagnosis not present

## 2023-06-28 DIAGNOSIS — E785 Hyperlipidemia, unspecified: Secondary | ICD-10-CM | POA: Diagnosis not present

## 2023-07-01 DIAGNOSIS — R2689 Other abnormalities of gait and mobility: Secondary | ICD-10-CM | POA: Diagnosis not present

## 2023-07-01 DIAGNOSIS — M6281 Muscle weakness (generalized): Secondary | ICD-10-CM | POA: Diagnosis not present

## 2023-07-01 DIAGNOSIS — E1165 Type 2 diabetes mellitus with hyperglycemia: Secondary | ICD-10-CM | POA: Diagnosis not present

## 2023-07-02 DIAGNOSIS — E1165 Type 2 diabetes mellitus with hyperglycemia: Secondary | ICD-10-CM | POA: Diagnosis not present

## 2023-07-02 DIAGNOSIS — M6281 Muscle weakness (generalized): Secondary | ICD-10-CM | POA: Diagnosis not present

## 2023-07-02 DIAGNOSIS — R2689 Other abnormalities of gait and mobility: Secondary | ICD-10-CM | POA: Diagnosis not present

## 2023-07-03 DIAGNOSIS — R2689 Other abnormalities of gait and mobility: Secondary | ICD-10-CM | POA: Diagnosis not present

## 2023-07-03 DIAGNOSIS — E1165 Type 2 diabetes mellitus with hyperglycemia: Secondary | ICD-10-CM | POA: Diagnosis not present

## 2023-07-03 DIAGNOSIS — M6281 Muscle weakness (generalized): Secondary | ICD-10-CM | POA: Diagnosis not present

## 2023-07-04 DIAGNOSIS — R2689 Other abnormalities of gait and mobility: Secondary | ICD-10-CM | POA: Diagnosis not present

## 2023-07-04 DIAGNOSIS — M6281 Muscle weakness (generalized): Secondary | ICD-10-CM | POA: Diagnosis not present

## 2023-07-04 DIAGNOSIS — E1165 Type 2 diabetes mellitus with hyperglycemia: Secondary | ICD-10-CM | POA: Diagnosis not present

## 2023-07-05 DIAGNOSIS — E1165 Type 2 diabetes mellitus with hyperglycemia: Secondary | ICD-10-CM | POA: Diagnosis not present

## 2023-07-05 DIAGNOSIS — R2689 Other abnormalities of gait and mobility: Secondary | ICD-10-CM | POA: Diagnosis not present

## 2023-07-05 DIAGNOSIS — M6281 Muscle weakness (generalized): Secondary | ICD-10-CM | POA: Diagnosis not present

## 2023-07-06 DIAGNOSIS — E1165 Type 2 diabetes mellitus with hyperglycemia: Secondary | ICD-10-CM | POA: Diagnosis not present

## 2023-07-06 DIAGNOSIS — Z79899 Other long term (current) drug therapy: Secondary | ICD-10-CM | POA: Diagnosis not present

## 2023-07-06 DIAGNOSIS — J449 Chronic obstructive pulmonary disease, unspecified: Secondary | ICD-10-CM | POA: Diagnosis not present

## 2023-07-24 DIAGNOSIS — I10 Essential (primary) hypertension: Secondary | ICD-10-CM | POA: Diagnosis not present

## 2023-07-30 DIAGNOSIS — E1165 Type 2 diabetes mellitus with hyperglycemia: Secondary | ICD-10-CM | POA: Diagnosis not present

## 2023-08-02 DIAGNOSIS — E1165 Type 2 diabetes mellitus with hyperglycemia: Secondary | ICD-10-CM | POA: Diagnosis not present

## 2023-08-09 DIAGNOSIS — E1165 Type 2 diabetes mellitus with hyperglycemia: Secondary | ICD-10-CM | POA: Diagnosis not present

## 2023-08-14 DIAGNOSIS — E1110 Type 2 diabetes mellitus with ketoacidosis without coma: Secondary | ICD-10-CM | POA: Diagnosis not present

## 2023-08-14 DIAGNOSIS — J45901 Unspecified asthma with (acute) exacerbation: Secondary | ICD-10-CM | POA: Diagnosis not present

## 2023-08-14 DIAGNOSIS — R296 Repeated falls: Secondary | ICD-10-CM | POA: Diagnosis not present

## 2023-08-16 ENCOUNTER — Telehealth: Payer: Self-pay | Admitting: Internal Medicine

## 2023-08-16 DIAGNOSIS — E1140 Type 2 diabetes mellitus with diabetic neuropathy, unspecified: Secondary | ICD-10-CM | POA: Diagnosis not present

## 2023-08-16 DIAGNOSIS — E1165 Type 2 diabetes mellitus with hyperglycemia: Secondary | ICD-10-CM | POA: Diagnosis not present

## 2023-08-16 DIAGNOSIS — Z7984 Long term (current) use of oral hypoglycemic drugs: Secondary | ICD-10-CM | POA: Diagnosis not present

## 2023-08-16 DIAGNOSIS — I6783 Posterior reversible encephalopathy syndrome: Secondary | ICD-10-CM | POA: Diagnosis not present

## 2023-08-16 DIAGNOSIS — I10 Essential (primary) hypertension: Secondary | ICD-10-CM | POA: Diagnosis not present

## 2023-08-16 DIAGNOSIS — Z794 Long term (current) use of insulin: Secondary | ICD-10-CM | POA: Diagnosis not present

## 2023-08-16 NOTE — Telephone Encounter (Signed)
This is not currently my patient, last seen >3 years ago. If she desires to re-establish would need a new patient apt (please cancel the upcoming). If she has changed PCP care that visit should be rescheduled with her current pcp. Accordingly we cannot give any orders for this patient.

## 2023-08-16 NOTE — Telephone Encounter (Signed)
Irving Burton called stating that she need verbal order and a message would be great if she is unavailable to take the call. The orders is for physical therapy 1x for 1 wk

## 2023-08-16 NOTE — Telephone Encounter (Signed)
Irving Burton called stating that she need verbal order and a message would be great if she is unavailable to take the call. The orders is for physical therapy 1x for 1 wk and 2x for 5 weeks..  Pt nurse also requested a prescription for a glu machine to check (blood sugar) and also management to show the pt and family how to use it.   Pt needs the list of meds below as well;  Risperidone (Bipolar meds) lactulose (CHRONULAC) 10 GM/15ML solution  ProAir Inhaler   Pt also did not come home with a front wheel walker if that can be prescribed if possible.Irving Burton stated if there is any additional questions to reach out to her directly Irving Burton- [phone]  PLEASE ADVISE, Lynford Humphrey

## 2023-08-19 DIAGNOSIS — Z794 Long term (current) use of insulin: Secondary | ICD-10-CM | POA: Diagnosis not present

## 2023-08-19 DIAGNOSIS — I10 Essential (primary) hypertension: Secondary | ICD-10-CM | POA: Diagnosis not present

## 2023-08-19 DIAGNOSIS — E1140 Type 2 diabetes mellitus with diabetic neuropathy, unspecified: Secondary | ICD-10-CM | POA: Diagnosis not present

## 2023-08-19 DIAGNOSIS — Z7984 Long term (current) use of oral hypoglycemic drugs: Secondary | ICD-10-CM | POA: Diagnosis not present

## 2023-08-19 DIAGNOSIS — I6783 Posterior reversible encephalopathy syndrome: Secondary | ICD-10-CM | POA: Diagnosis not present

## 2023-08-19 DIAGNOSIS — E1165 Type 2 diabetes mellitus with hyperglycemia: Secondary | ICD-10-CM | POA: Diagnosis not present

## 2023-08-20 ENCOUNTER — Inpatient Hospital Stay: Payer: 59 | Admitting: Internal Medicine

## 2023-08-20 ENCOUNTER — Ambulatory Visit: Payer: 59 | Admitting: Family Medicine

## 2023-08-23 ENCOUNTER — Ambulatory Visit: Payer: 59 | Admitting: Internal Medicine

## 2023-09-13 ENCOUNTER — Encounter: Payer: Self-pay | Admitting: Family Medicine

## 2023-09-13 ENCOUNTER — Ambulatory Visit (INDEPENDENT_AMBULATORY_CARE_PROVIDER_SITE_OTHER): Payer: 59 | Admitting: Family Medicine

## 2023-09-13 VITALS — BP 118/82 | HR 79 | Temp 98.3°F | Ht 63.0 in

## 2023-09-13 DIAGNOSIS — I10 Essential (primary) hypertension: Secondary | ICD-10-CM

## 2023-09-13 DIAGNOSIS — E1165 Type 2 diabetes mellitus with hyperglycemia: Secondary | ICD-10-CM

## 2023-09-13 DIAGNOSIS — K219 Gastro-esophageal reflux disease without esophagitis: Secondary | ICD-10-CM

## 2023-09-13 DIAGNOSIS — G63 Polyneuropathy in diseases classified elsewhere: Secondary | ICD-10-CM | POA: Diagnosis not present

## 2023-09-13 DIAGNOSIS — B20 Human immunodeficiency virus [HIV] disease: Secondary | ICD-10-CM

## 2023-09-13 DIAGNOSIS — M6281 Muscle weakness (generalized): Secondary | ICD-10-CM | POA: Diagnosis not present

## 2023-09-13 DIAGNOSIS — J4520 Mild intermittent asthma, uncomplicated: Secondary | ICD-10-CM

## 2023-09-13 DIAGNOSIS — Z794 Long term (current) use of insulin: Secondary | ICD-10-CM | POA: Diagnosis not present

## 2023-09-13 DIAGNOSIS — E785 Hyperlipidemia, unspecified: Secondary | ICD-10-CM | POA: Diagnosis not present

## 2023-09-13 DIAGNOSIS — F411 Generalized anxiety disorder: Secondary | ICD-10-CM

## 2023-09-13 DIAGNOSIS — F250 Schizoaffective disorder, bipolar type: Secondary | ICD-10-CM | POA: Diagnosis not present

## 2023-09-13 DIAGNOSIS — E119 Type 2 diabetes mellitus without complications: Secondary | ICD-10-CM

## 2023-09-13 MED ORDER — ROSUVASTATIN CALCIUM 20 MG PO TABS: 20.0000 mg | ORAL_TABLET | Freq: Every day | ORAL | 1 refills | Status: DC

## 2023-09-13 MED ORDER — "PEN NEEDLES 5/16"" 30G X 8 MM MISC": 1.0000 | Freq: Every day | 1 refills | Status: DC

## 2023-09-13 MED ORDER — ALBUTEROL SULFATE HFA 108 (90 BASE) MCG/ACT IN AERS
2.0000 | INHALATION_SPRAY | Freq: Four times a day (QID) | RESPIRATORY_TRACT | 3 refills | Status: DC | PRN
Start: 2023-09-13 — End: 2023-10-30

## 2023-09-13 MED ORDER — BLOOD GLUCOSE TEST VI STRP: 1.0000 | ORAL_STRIP | Freq: Three times a day (TID) | 0 refills | Status: DC

## 2023-09-13 MED ORDER — BLOOD GLUCOSE MONITORING SUPPL DEVI: 1.0000 | Freq: Three times a day (TID) | 0 refills | Status: DC

## 2023-09-13 MED ORDER — LACOSAMIDE 150 MG PO TABS
150.0000 mg | ORAL_TABLET | Freq: Two times a day (BID) | ORAL | 0 refills | Status: DC
Start: 2023-09-13 — End: 2023-10-30

## 2023-09-13 MED ORDER — LEVETIRACETAM 1000 MG PO TABS: 1000.0000 mg | ORAL_TABLET | Freq: Two times a day (BID) | ORAL | 1 refills | Status: DC

## 2023-09-13 MED ORDER — RISPERIDONE 2 MG PO TABS: 2.0000 mg | ORAL_TABLET | Freq: Every day | ORAL | 1 refills | Status: DC

## 2023-09-13 MED ORDER — DIVALPROEX SODIUM 500 MG PO DR TAB: 500.0000 mg | DELAYED_RELEASE_TABLET | Freq: Three times a day (TID) | ORAL | 1 refills | Status: DC

## 2023-09-13 MED ORDER — CLONIDINE HCL 0.2 MG PO TABS
0.2000 mg | ORAL_TABLET | Freq: Two times a day (BID) | ORAL | 1 refills | Status: DC
Start: 2023-09-13 — End: 2023-10-30

## 2023-09-13 MED ORDER — PHENOBARBITAL 64.8 MG PO TABS: 64.8000 mg | ORAL_TABLET | Freq: Two times a day (BID) | ORAL | 1 refills | Status: DC

## 2023-09-13 MED ORDER — METFORMIN HCL 500 MG PO TABS: 500.0000 mg | ORAL_TABLET | Freq: Two times a day (BID) | ORAL | 1 refills | Status: DC

## 2023-09-13 MED ORDER — TRIAMTERENE-HCTZ 37.5-25 MG PO TABS: 1.0000 | ORAL_TABLET | Freq: Every day | ORAL | 1 refills | Status: DC

## 2023-09-13 MED ORDER — AMLODIPINE BESYLATE 10 MG PO TABS: 10.0000 mg | ORAL_TABLET | Freq: Every day | ORAL | 0 refills | Status: DC

## 2023-09-13 MED ORDER — SERTRALINE HCL 100 MG PO TABS: 100.0000 mg | ORAL_TABLET | Freq: Every day | ORAL | 0 refills | Status: DC

## 2023-09-13 MED ORDER — DOLUTEGRAVIR SODIUM 50 MG PO TABS: 50.0000 mg | ORAL_TABLET | Freq: Every day | ORAL | 0 refills | Status: DC

## 2023-09-13 MED ORDER — ABACAVIR-DOLUTEGRAVIR-LAMIVUD 600-50-300 MG PO TABS
1.0000 | ORAL_TABLET | Freq: Every morning | ORAL | 0 refills | Status: DC
Start: 2023-09-13 — End: 2023-10-30

## 2023-09-13 MED ORDER — GABAPENTIN 300 MG PO CAPS: 300.0000 mg | ORAL_CAPSULE | Freq: Three times a day (TID) | ORAL | 0 refills | Status: DC

## 2023-09-13 MED ORDER — LANTUS SOLOSTAR 100 UNIT/ML ~~LOC~~ SOPN: 15.0000 [IU] | PEN_INJECTOR | Freq: Every day | SUBCUTANEOUS | 1 refills | Status: DC

## 2023-09-13 MED ORDER — LORAZEPAM 0.5 MG PO TABS
0.5000 mg | ORAL_TABLET | Freq: Four times a day (QID) | ORAL | 0 refills | Status: DC | PRN
Start: 2023-09-13 — End: 2023-10-31

## 2023-09-13 MED ORDER — LANCETS MISC. MISC: 1.0000 | Freq: Three times a day (TID) | 0 refills | Status: AC

## 2023-09-13 MED ORDER — OMEPRAZOLE 40 MG PO CPDR: 40.0000 mg | DELAYED_RELEASE_CAPSULE | Freq: Every day | ORAL | 1 refills | Status: DC

## 2023-09-13 NOTE — Patient Instructions (Addendum)
 Welcome to [facility]!  We are drawing labs today, we will be in contact with any abnormal results that require further attention.  Follow up with me in 3 mos for medication management and labs.  We have sent in multiple referrals, someone will be reaching out to get you scheduled for these.
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
## 2023-09-16 ENCOUNTER — Telehealth: Payer: Self-pay | Admitting: *Deleted

## 2023-09-16 ENCOUNTER — Encounter: Payer: Self-pay | Admitting: Family Medicine

## 2023-09-16 NOTE — Progress Notes (Signed)
 Complex Care Management Note  Care Guide Note 09/16/2023 Name: MARYCLARE NYDAM MRN: [id number] DOB: 02-02-1961  SHONIKA KOLASINSKI is a 62 y.o. year old female who sees Alvia Krabbe, FNP for primary care. I reached out to [employer] by phone today to offer complex care management services.  Ms. Spizzirri was given information about Complex Care Management services today including:   The Complex Care Management services include support from the care team which includes your Nurse Coordinator, Clinical Social Worker, or Pharmacist.  The Complex Care Management team is here to help remove barriers to the health concerns and goals most important to you. Complex Care Management services are voluntary, and the patient may decline or stop services at any time by request to their care team member.   Complex Care Management Consent Status: Patient agreed to services and verbal consent obtained.   Follow up plan:  Telephone appointment with complex care management team member scheduled for:  09/19/2023 and 09/20/2023  Encounter Outcome:  Patient Scheduled  Thedford Franks, CCMA Care Coordination Care Guide Direct Dial: [phone]

## 2023-09-16 NOTE — Addendum Note (Signed)
 Addended by: Sherald Barge on: 09/16/2023 08:33 AM   Modules accepted: Level of Service

## 2023-09-19 ENCOUNTER — Ambulatory Visit: Payer: Self-pay | Admitting: Licensed Clinical Social Worker

## 2023-09-19 NOTE — Patient Instructions (Signed)
Visit Information  Thank you for taking time to visit with me today. Please don't hesitate to contact me if I can be of assistance to you.   Following are the goals we discussed today:   Goals Addressed             This Visit's Progress    Increase access to community resources.       Care Coordination Interventions: Assessed Social Determinants of Health Reviewed all upcoming appointments in Epic system Solution-Focused Strategies employed:  Active listening / Reflection utilized  Emotional Support Provided Follow up with landlord for ramp to be built Look for call regarding psychiatry referral Follow up with CAP program regarding place on waitlist Practice self-care when available           Our next appointment is by telephone on 10/03/2023   Please call the care guide team at [phone] if you need to cancel or reschedule your appointment.   If you are experiencing a Mental Health or Behavioral Health Crisis or need someone to talk to, please call the Suicide and Crisis Lifeline: 988  Patient verbalizes understanding of instructions and care plan provided today and agrees to view in MyChart. Active MyChart status and patient understanding of how to access instructions and care plan via MyChart confirmed with patient.     Telephone follow up appointment with care management team member scheduled for: 10/03/2023

## 2023-09-19 NOTE — Patient Outreach (Signed)
  Care Coordination   Initial Visit Note   09/19/2023 Name: Susan [name] MRN: [id number] DOB: 1960/12/23  Susan [name] is a 63 y.o. year old female who sees Moshe Cipro, FNP for primary care. I spoke with  Madelaine Etienne by phone today.  What matters to the patients health and wellness today?  Increasing Access to community resources.    Goals Addressed             This Visit's Progress    Patient Stated       Care Coordination Interventions: Assessed Social Determinants of Health Reviewed all upcoming appointments in Epic system Solution-Focused Strategies employed:  Active listening / Reflection utilized  Emotional Support Provided Follow up with landlord for ramp to be built Look for call regarding psychiatry referral Follow up with CAP program regarding place on waitlist Practice self-care when available           SDOH assessments and interventions completed:  Yes  SDOH Interventions Today    Flowsheet Row Most Recent Value  SDOH Interventions   Food Insecurity Interventions Intervention Not Indicated  Housing Interventions Intervention Not Indicated  Transportation Interventions Intervention Not Indicated  Utilities Interventions Intervention Not Indicated        Care Coordination Interventions:  Yes, provided   Interventions Today    Flowsheet Row Most Recent Value  Chronic Disease   Chronic disease during today's visit Other  [Caregiver Stress]  General Interventions   General Interventions Discussed/Reviewed [facility]  [Pt came home from skilled facility in December, 2024. Pt is currently receiving 2-3 hours of PCServices each day, pt is on waitlist for CAP program, unusre of her placement, CSW will reach out to them. Family is making use of Medicaid Transportation.]  Education Interventions   Education Provided Provided Education  [Family had concerns for affording incontinence supplies. CSW gave possible resources for Medicaid  covered supplies which family will reach out to. Family is in process of getting SS check switched from facility to pt, dtr's husband will be rep payee.]  Mental Health Interventions   Mental Health Discussed/Reviewed Mental Health Discussed, Mental Health Reviewed  [PCP ordered psych referral, CSW will follow up with PCP on where the reerral went to for family knowledge.]        Follow up plan: Follow up call scheduled for 10/03/2023    Encounter Outcome:  Patient Visit Completed   Kenton Kingfisher, LCSW [facility], [facility] Licensed Clinical Social Worker Care Coordinator [phone]

## 2023-09-19 NOTE — Patient Outreach (Signed)
  Care Coordination   Care Coordination/Collaboration  Visit Note   09/19/2023 Name: Susan [name] MRN: [id number] DOB: 1960-09-15  Susan [name] is a 63 y.o. year old female who sees Moshe Cipro, FNP for primary care. No patient contact was made during this encounter.   Collaboration/Care coordination: RNCM discussed patient status, level of care and plan of care with Kenton Kingfisher, LCSW.   Goals Addressed             This Visit's Progress    Care Coordination       Interventions Today    Flowsheet Row Most Recent Value  General Interventions   General Interventions Discussed/Reviewed Communication with  Communication with Social Work  Lavinia Sharps coordination/collaboration regarding patient status, level of care and plan of care.]            SDOH assessments and interventions completed:  No  Care Coordination Interventions:  Yes, provided   Follow up plan:  Initial outreach scheduled for 09/20/23    Encounter Outcome:  Patient Visit Completed   Kathyrn Sheriff, RN, MSN, BSN, CCM [facility] RN Care Manager [phone]

## 2023-09-20 ENCOUNTER — Telehealth: Payer: Self-pay | Admitting: Family Medicine

## 2023-09-20 ENCOUNTER — Telehealth: Payer: Self-pay

## 2023-09-20 DIAGNOSIS — E1140 Type 2 diabetes mellitus with diabetic neuropathy, unspecified: Secondary | ICD-10-CM | POA: Diagnosis not present

## 2023-09-20 DIAGNOSIS — Z7984 Long term (current) use of oral hypoglycemic drugs: Secondary | ICD-10-CM | POA: Diagnosis not present

## 2023-09-20 DIAGNOSIS — Z794 Long term (current) use of insulin: Secondary | ICD-10-CM | POA: Diagnosis not present

## 2023-09-20 DIAGNOSIS — E1165 Type 2 diabetes mellitus with hyperglycemia: Secondary | ICD-10-CM | POA: Diagnosis not present

## 2023-09-20 DIAGNOSIS — I10 Essential (primary) hypertension: Secondary | ICD-10-CM | POA: Diagnosis not present

## 2023-09-20 DIAGNOSIS — I6783 Posterior reversible encephalopathy syndrome: Secondary | ICD-10-CM | POA: Diagnosis not present

## 2023-09-20 NOTE — Telephone Encounter (Signed)
Copied from CRM [id number]. Topic: Clinical - Home Health Verbal Orders >> Sep 20, 2023  2:35 PM Fredrich Romans wrote: Caller/Agency: Suzette Battiest Number: [phone] Service Requested: Physical Therapy Frequency: 2wk2 1wk1 Recertify week of Feb 3 Any new concerns about the patient? No

## 2023-09-20 NOTE — Telephone Encounter (Signed)
Copied from CRM [id number]. Topic: Clinical - Home Health Verbal Orders >> Sep 20, 2023  2:27 PM Lennart Pall wrote: Reason for CRM: Will [facility] [phone] Verbal Order 2x a week for 3 weeks

## 2023-09-20 NOTE — Patient Outreach (Signed)
  Care Coordination   09/20/2023 Name: Susan [name] MRN: [id number] DOB: 1961-03-03   Care Coordination Outreach Attempts:  An unsuccessful outreach was attempted for an appointment today.  Follow Up Plan:  Additional outreach attempts will be made to offer the patient complex care management information and services.   Encounter Outcome:  No Answer   Care Coordination Interventions:  No, not indicated    Kathyrn Sheriff, RN, MSN, BSN, CCM RN Care Management [phone]

## 2023-09-23 NOTE — Telephone Encounter (Signed)
Called and spoke with both Susan [name] and Susan [name] to give verbal okay for physical therapy and occupational therapy with frequency listed below.

## 2023-09-24 DIAGNOSIS — I6783 Posterior reversible encephalopathy syndrome: Secondary | ICD-10-CM | POA: Diagnosis not present

## 2023-09-24 DIAGNOSIS — E1165 Type 2 diabetes mellitus with hyperglycemia: Secondary | ICD-10-CM | POA: Diagnosis not present

## 2023-09-24 DIAGNOSIS — Z7984 Long term (current) use of oral hypoglycemic drugs: Secondary | ICD-10-CM | POA: Diagnosis not present

## 2023-09-24 DIAGNOSIS — Z794 Long term (current) use of insulin: Secondary | ICD-10-CM | POA: Diagnosis not present

## 2023-09-24 DIAGNOSIS — I10 Essential (primary) hypertension: Secondary | ICD-10-CM | POA: Diagnosis not present

## 2023-09-24 DIAGNOSIS — E1140 Type 2 diabetes mellitus with diabetic neuropathy, unspecified: Secondary | ICD-10-CM | POA: Diagnosis not present

## 2023-09-26 DIAGNOSIS — E1165 Type 2 diabetes mellitus with hyperglycemia: Secondary | ICD-10-CM | POA: Diagnosis not present

## 2023-09-26 DIAGNOSIS — Z7984 Long term (current) use of oral hypoglycemic drugs: Secondary | ICD-10-CM | POA: Diagnosis not present

## 2023-09-26 DIAGNOSIS — Z794 Long term (current) use of insulin: Secondary | ICD-10-CM | POA: Diagnosis not present

## 2023-09-26 DIAGNOSIS — I6783 Posterior reversible encephalopathy syndrome: Secondary | ICD-10-CM | POA: Diagnosis not present

## 2023-09-26 DIAGNOSIS — E1140 Type 2 diabetes mellitus with diabetic neuropathy, unspecified: Secondary | ICD-10-CM | POA: Diagnosis not present

## 2023-09-26 DIAGNOSIS — I10 Essential (primary) hypertension: Secondary | ICD-10-CM | POA: Diagnosis not present

## 2023-09-27 NOTE — Telephone Encounter (Signed)
Copied from CRM [id number]. Topic: General - Other >> Sep 27, 2023  2:27 PM Donita Brooks wrote: Reason for CRM: Rechetta from [facility] is calling to report that pt miss visit due to not feeling well Call back number - [phone]

## 2023-10-01 ENCOUNTER — Ambulatory Visit: Payer: Self-pay

## 2023-10-01 DIAGNOSIS — E1165 Type 2 diabetes mellitus with hyperglycemia: Secondary | ICD-10-CM | POA: Diagnosis not present

## 2023-10-01 DIAGNOSIS — Z794 Long term (current) use of insulin: Secondary | ICD-10-CM | POA: Diagnosis not present

## 2023-10-01 DIAGNOSIS — I10 Essential (primary) hypertension: Secondary | ICD-10-CM | POA: Diagnosis not present

## 2023-10-01 DIAGNOSIS — Z7984 Long term (current) use of oral hypoglycemic drugs: Secondary | ICD-10-CM | POA: Diagnosis not present

## 2023-10-01 DIAGNOSIS — E1140 Type 2 diabetes mellitus with diabetic neuropathy, unspecified: Secondary | ICD-10-CM | POA: Diagnosis not present

## 2023-10-01 DIAGNOSIS — I6783 Posterior reversible encephalopathy syndrome: Secondary | ICD-10-CM | POA: Diagnosis not present

## 2023-10-01 NOTE — Patient Outreach (Signed)
  Care Coordination   Initial Visit Note   10/01/2023 Name: Susan [name] MRN: [id number] DOB: Oct 22, 1960  Susan [name] is a 63 y.o. year old female who sees Moshe Cipro, FNP for primary care. I spoke with daughter Elinora Weigand  by phone today.  What matters to the patients health and wellness today?  Per Daughter, patient was in skilled facility, but daughter recently brought patient home to live with her and her family. Patient has PCS 3 hours-5 days/week and 2 hours/day on Sat and Sun. And patient is on the wait list for CAP services. Daughter assist patient with all patient health needs. She reports patient is active with home health PT/OT at this time. Patient daughter reports patient's blood sugar ranges between 98-148-denies any needs regarding blood sugar management.   She is without questions or concerns at this time.   Goals Addressed             This Visit's Progress    Assist with management of Care/health       Interventions Today    Flowsheet Row Most Recent Value  Chronic Disease   Chronic disease during today's visit Diabetes, Hypertension (HTN), Other  General Interventions   General Interventions Discussed/Reviewed General Interventions Discussed, Durable Medical Equipment (DME), Doctor Visits  [Evaluation of current treatment plan for health condition and patient's adherence to plan. provided contact number for [facility] Navigator: [facility] Navigator [phone]  Doctor Visits Discussed/Reviewed PCP, Doctor Visits Discussed  Durable Medical Equipment (DME) BP Cuff, Glucomoter, Environmental consultant, Psychologist, forensic  PCP/Specialist Visits Compliance with follow-up visit  [reviewed upcoming/scheduled appointments]  Education Interventions   Education Provided Provided Education  Provided Engineer, petroleum On Medication, Blood Sugar Monitoring, When to see the doctor, Freight forwarder to continue to take medications as prescribed, attend provider visits as  scheduled, contact provider with health questions or concerns]  Nutrition Interventions   Nutrition Discussed/Reviewed Nutrition Discussed  Pharmacy Interventions   Pharmacy Dicussed/Reviewed Pharmacy Topics Discussed  [medications reviewed]  Safety Interventions   Safety Discussed/Reviewed Safety Discussed, Fall Risk, Home Safety  [confirmed patient is active with home health PT/OT at this time.]  Home Safety Assistive Devices           COMPLETED: Care Coordination       Interventions Today    Flowsheet Row Most Recent Value  General Interventions   General Interventions Discussed/Reviewed Communication with  Communication with Social Work  Conservator, museum/gallery coordination/collaboration regarding patient status, level of care and plan of care.]            SDOH assessments and interventions completed:  Yes  Care Coordination Interventions:  Yes, provided   Follow up plan: Follow up call scheduled for 10/31/23    Encounter Outcome:  Patient Visit Completed   Kathyrn Sheriff, RN, MSN, BSN, CCM [place]  [facility], Population Health Case Manager Phone: [phone]

## 2023-10-01 NOTE — Patient Instructions (Signed)
Visit Information  Thank you for taking time to visit with me today. Please don't hesitate to contact me if I can be of assistance to you.   Following are the goals we discussed today:  Continue to take medications as prescribed. Continue to attend provider visits as scheduled Continue to eat healthy, lean meats, vegetables, fruits, avoid saturated and transfats Contact provider with health questions or concerns as needed Continue to check blood sugar as recommended and notify provider if questions or concerns Continue to check blood pressure routinely and contact provider if questions or concerns  Our next appointment is by telephone on 10/31/23 at 2:00 pm  Please call the care guide team at [phone] if you need to cancel or reschedule your appointment.   If you are experiencing a Mental Health or Behavioral Health Crisis or need someone to talk to, please call the Suicide and Crisis Lifeline: 988 call the [place] National Suicide Prevention Lifeline: (657) 437-3304 or TTY: (337)643-7774 TTY 410-854-6072) to talk to a trained counselor  Kathyrn Sheriff, RN, MSN, BSN, CCM [place]  [facility], Population Health Case Manager Phone: [phone]

## 2023-10-03 ENCOUNTER — Ambulatory Visit: Payer: Self-pay | Admitting: Licensed Clinical Social Worker

## 2023-10-03 DIAGNOSIS — E1140 Type 2 diabetes mellitus with diabetic neuropathy, unspecified: Secondary | ICD-10-CM | POA: Diagnosis not present

## 2023-10-03 DIAGNOSIS — I6783 Posterior reversible encephalopathy syndrome: Secondary | ICD-10-CM | POA: Diagnosis not present

## 2023-10-03 DIAGNOSIS — Z7984 Long term (current) use of oral hypoglycemic drugs: Secondary | ICD-10-CM | POA: Diagnosis not present

## 2023-10-03 DIAGNOSIS — E1165 Type 2 diabetes mellitus with hyperglycemia: Secondary | ICD-10-CM | POA: Diagnosis not present

## 2023-10-03 DIAGNOSIS — I10 Essential (primary) hypertension: Secondary | ICD-10-CM | POA: Diagnosis not present

## 2023-10-03 DIAGNOSIS — Z794 Long term (current) use of insulin: Secondary | ICD-10-CM | POA: Diagnosis not present

## 2023-10-03 NOTE — Patient Instructions (Signed)
Visit Information  Thank you for taking time to visit with me today. Please don't hesitate to contact me if I can be of assistance to you.   Following are the goals we discussed today:   Goals Addressed   None     Our next appointment is by telephone on 10/24/2023.  Please call the care guide team at [phone] if you need to cancel or reschedule your appointment.   If you are experiencing a Mental Health or Behavioral Health Crisis or need someone to talk to, please call the Suicide and Crisis Lifeline: 988  Patient verbalizes understanding of instructions and care plan provided today and agrees to view in MyChart. Active MyChart status and patient understanding of how to access instructions and care plan via MyChart confirmed with patient.     Telephone follow up appointment with care management team member scheduled for: 10/24/2023

## 2023-10-03 NOTE — Patient Outreach (Signed)
  Care Coordination   Follow Up Visit Note   10/03/2023 Name: Susan [name] MRN: [id number] DOB: 1961/05/11  Susan [name] is a 63 y.o. year old female who sees Moshe Cipro, FNP for primary care. I spoke with  Madelaine Etienne by phone today.  What matters to the patients health and wellness today?  Increasing access to community resources.    Goals Addressed   None     SDOH assessments and interventions completed:  Yes     Care Coordination Interventions:  Yes, provided   Interventions Today    Flowsheet Row Most Recent Value  Chronic Disease   Chronic disease during today's visit Other  [MDD]  General Interventions   General Interventions Discussed/Reviewed [facility], General Interventions Discussed  [CSW followed up with pt's dtr regarding CAP program - Dtr stated she received letter from CAP regarding their services and plans on following up with them. CSW provided their contact information. Pt is now recieving PT/OT which has been helpful.]  Education Interventions   Education Provided Provided Education  [CSW and dtr discussed pt's social security - son-in-law will be pt's rep payee. They have been in contact with social security and Feb's check should be mailed to pt's home. Family will sollow up with SS office if it does not come.]  Mental Health Interventions   Mental Health Discussed/Reviewed Mental Health Discussed, Mental Health Reviewed  [Dtr has not heard from any psychiatric offices regarding services - dtr has spoken with PCP office and she will request another referral be sent if they do not hear anything by next week]        Follow up plan: Follow up call scheduled for 10/24/2023    Encounter Outcome:  Patient Visit Completed   Kenton Kingfisher, LCSW [facility], [facility] Licensed Clinical Social Worker Care Coordinator [phone]

## 2023-10-04 DIAGNOSIS — Z7984 Long term (current) use of oral hypoglycemic drugs: Secondary | ICD-10-CM | POA: Diagnosis not present

## 2023-10-04 DIAGNOSIS — Z794 Long term (current) use of insulin: Secondary | ICD-10-CM | POA: Diagnosis not present

## 2023-10-04 DIAGNOSIS — I6783 Posterior reversible encephalopathy syndrome: Secondary | ICD-10-CM | POA: Diagnosis not present

## 2023-10-04 DIAGNOSIS — E1140 Type 2 diabetes mellitus with diabetic neuropathy, unspecified: Secondary | ICD-10-CM | POA: Diagnosis not present

## 2023-10-04 DIAGNOSIS — E1165 Type 2 diabetes mellitus with hyperglycemia: Secondary | ICD-10-CM | POA: Diagnosis not present

## 2023-10-04 DIAGNOSIS — I10 Essential (primary) hypertension: Secondary | ICD-10-CM | POA: Diagnosis not present

## 2023-10-05 DIAGNOSIS — E1110 Type 2 diabetes mellitus with ketoacidosis without coma: Secondary | ICD-10-CM | POA: Diagnosis not present

## 2023-10-05 DIAGNOSIS — R296 Repeated falls: Secondary | ICD-10-CM | POA: Diagnosis not present

## 2023-10-05 DIAGNOSIS — J45901 Unspecified asthma with (acute) exacerbation: Secondary | ICD-10-CM | POA: Diagnosis not present

## 2023-10-07 DIAGNOSIS — Z794 Long term (current) use of insulin: Secondary | ICD-10-CM | POA: Diagnosis not present

## 2023-10-07 DIAGNOSIS — I6783 Posterior reversible encephalopathy syndrome: Secondary | ICD-10-CM | POA: Diagnosis not present

## 2023-10-07 DIAGNOSIS — Z7984 Long term (current) use of oral hypoglycemic drugs: Secondary | ICD-10-CM | POA: Diagnosis not present

## 2023-10-07 DIAGNOSIS — E1165 Type 2 diabetes mellitus with hyperglycemia: Secondary | ICD-10-CM | POA: Diagnosis not present

## 2023-10-07 DIAGNOSIS — I10 Essential (primary) hypertension: Secondary | ICD-10-CM | POA: Diagnosis not present

## 2023-10-07 DIAGNOSIS — E1140 Type 2 diabetes mellitus with diabetic neuropathy, unspecified: Secondary | ICD-10-CM | POA: Diagnosis not present

## 2023-10-08 ENCOUNTER — Telehealth: Payer: Self-pay
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
## 2023-10-11 ENCOUNTER — Telehealth: Payer: Self-pay | Admitting: Family Medicine

## 2023-10-11 DIAGNOSIS — Z794 Long term (current) use of insulin: Secondary | ICD-10-CM | POA: Diagnosis not present

## 2023-10-11 DIAGNOSIS — E1165 Type 2 diabetes mellitus with hyperglycemia: Secondary | ICD-10-CM | POA: Diagnosis not present

## 2023-10-11 DIAGNOSIS — E1140 Type 2 diabetes mellitus with diabetic neuropathy, unspecified: Secondary | ICD-10-CM | POA: Diagnosis not present

## 2023-10-11 DIAGNOSIS — I6783 Posterior reversible encephalopathy syndrome: Secondary | ICD-10-CM | POA: Diagnosis not present

## 2023-10-11 DIAGNOSIS — I10 Essential (primary) hypertension: Secondary | ICD-10-CM | POA: Diagnosis not present

## 2023-10-11 DIAGNOSIS — Z7984 Long term (current) use of oral hypoglycemic drugs: Secondary | ICD-10-CM | POA: Diagnosis not present

## 2023-10-11 NOTE — Telephone Encounter (Signed)
 Copied from CRM [id number]. Topic: General - Other >> Oct 11, 2023 12:18 PM Corean SAUNDERS wrote: Reason for CRM: Patients daughter is requesting an update on the note requested from Corean Hoots. Please call patients daughter at [phone] when this is completed.

## 2023-10-11 NOTE — Telephone Encounter (Signed)
 Copied from CRM [id number]. Topic: Clinical - Home Health Verbal Orders >> Oct 11, 2023 12:27 PM Powell NOVAK wrote: Caller/Agency: [facility] Callback Number: [phone] Service Requested: PT, OT Frequency: pt 2 weekx2, ot 1 weekx4 Any new concerns about the patient? no

## 2023-10-14 NOTE — Telephone Encounter (Signed)
 Called daughter and informed her document was ready for pick up. Placed in envelope and put up front under patient's name.

## 2023-10-14 NOTE — Telephone Encounter (Signed)
 Called and left message giving okay for verbal orders. Voice mail box was secure.

## 2023-10-14 NOTE — Telephone Encounter (Signed)
 Form has been picked up.

## 2023-10-16 ENCOUNTER — Other Ambulatory Visit: Payer: Self-pay | Admitting: Family Medicine

## 2023-10-16 DIAGNOSIS — B20 Human immunodeficiency virus [HIV] disease: Secondary | ICD-10-CM

## 2023-10-17 ENCOUNTER — Other Ambulatory Visit: Payer: Self-pay | Admitting: Family Medicine

## 2023-10-17 DIAGNOSIS — F250 Schizoaffective disorder, bipolar type: Secondary | ICD-10-CM

## 2023-10-18 ENCOUNTER — Other Ambulatory Visit: Payer: Self-pay | Admitting: Family Medicine

## 2023-10-18 DIAGNOSIS — F250 Schizoaffective disorder, bipolar type: Secondary | ICD-10-CM

## 2023-10-18 DIAGNOSIS — F4310 Post-traumatic stress disorder, unspecified: Secondary | ICD-10-CM

## 2023-10-19 ENCOUNTER — Inpatient Hospital Stay (HOSPITAL_COMMUNITY)
Admission: EM | Admit: 2023-10-19 | Discharge: 2023-10-22 | DRG: 974 | Disposition: A | Discharge status: Home / Self Care | Payer: 59 | Attending: Internal Medicine | Admitting: Internal Medicine

## 2023-10-19 ENCOUNTER — Emergency Department (HOSPITAL_COMMUNITY): Payer: 59

## 2023-10-19 ENCOUNTER — Other Ambulatory Visit: Payer: Self-pay

## 2023-10-19 ENCOUNTER — Encounter (HOSPITAL_COMMUNITY): Payer: Self-pay | Admitting: Emergency Medicine

## 2023-10-19 DIAGNOSIS — Z1152 Encounter for screening for COVID-19: Secondary | ICD-10-CM

## 2023-10-19 DIAGNOSIS — K219 Gastro-esophageal reflux disease without esophagitis: Secondary | ICD-10-CM | POA: Diagnosis present

## 2023-10-19 DIAGNOSIS — I69391 Dysphagia following cerebral infarction: Secondary | ICD-10-CM | POA: Diagnosis not present

## 2023-10-19 DIAGNOSIS — J101 Influenza due to other identified influenza virus with other respiratory manifestations: Secondary | ICD-10-CM | POA: Diagnosis present

## 2023-10-19 DIAGNOSIS — Z7984 Long term (current) use of oral hypoglycemic drugs: Secondary | ICD-10-CM

## 2023-10-19 DIAGNOSIS — R0689 Other abnormalities of breathing: Secondary | ICD-10-CM | POA: Diagnosis not present

## 2023-10-19 DIAGNOSIS — A4189 Other specified sepsis: Secondary | ICD-10-CM | POA: Diagnosis present

## 2023-10-19 DIAGNOSIS — I959 Hypotension, unspecified: Secondary | ICD-10-CM | POA: Diagnosis present

## 2023-10-19 DIAGNOSIS — G40909 Epilepsy, unspecified, not intractable, without status epilepticus: Secondary | ICD-10-CM | POA: Diagnosis present

## 2023-10-19 DIAGNOSIS — J441 Chronic obstructive pulmonary disease with (acute) exacerbation: Secondary | ICD-10-CM | POA: Diagnosis present

## 2023-10-19 DIAGNOSIS — R509 Fever, unspecified: Secondary | ICD-10-CM | POA: Diagnosis not present

## 2023-10-19 DIAGNOSIS — A419 Sepsis, unspecified organism: Secondary | ICD-10-CM | POA: Diagnosis not present

## 2023-10-19 DIAGNOSIS — B20 Human immunodeficiency virus [HIV] disease: Secondary | ICD-10-CM | POA: Diagnosis present

## 2023-10-19 DIAGNOSIS — Z21 Asymptomatic human immunodeficiency virus [HIV] infection status: Secondary | ICD-10-CM

## 2023-10-19 DIAGNOSIS — F411 Generalized anxiety disorder: Secondary | ICD-10-CM | POA: Diagnosis present

## 2023-10-19 DIAGNOSIS — R918 Other nonspecific abnormal finding of lung field: Secondary | ICD-10-CM | POA: Diagnosis not present

## 2023-10-19 DIAGNOSIS — I69328 Other speech and language deficits following cerebral infarction: Secondary | ICD-10-CM

## 2023-10-19 DIAGNOSIS — I10 Essential (primary) hypertension: Secondary | ICD-10-CM | POA: Diagnosis present

## 2023-10-19 DIAGNOSIS — R6889 Other general symptoms and signs: Secondary | ICD-10-CM | POA: Diagnosis not present

## 2023-10-19 DIAGNOSIS — N179 Acute kidney failure, unspecified: Secondary | ICD-10-CM | POA: Diagnosis present

## 2023-10-19 DIAGNOSIS — R0989 Other specified symptoms and signs involving the circulatory and respiratory systems: Secondary | ICD-10-CM | POA: Diagnosis not present

## 2023-10-19 DIAGNOSIS — F17210 Nicotine dependence, cigarettes, uncomplicated: Secondary | ICD-10-CM | POA: Diagnosis present

## 2023-10-19 DIAGNOSIS — Z6841 Body Mass Index (BMI) 40.0 and over, adult: Secondary | ICD-10-CM

## 2023-10-19 DIAGNOSIS — R6520 Severe sepsis without septic shock: Secondary | ICD-10-CM | POA: Diagnosis present

## 2023-10-19 DIAGNOSIS — R0602 Shortness of breath: Secondary | ICD-10-CM | POA: Diagnosis not present

## 2023-10-19 DIAGNOSIS — I700 Atherosclerosis of aorta: Secondary | ICD-10-CM | POA: Diagnosis not present

## 2023-10-19 DIAGNOSIS — Z818 Family history of other mental and behavioral disorders: Secondary | ICD-10-CM

## 2023-10-19 DIAGNOSIS — F250 Schizoaffective disorder, bipolar type: Secondary | ICD-10-CM | POA: Diagnosis present

## 2023-10-19 DIAGNOSIS — J1000 Influenza due to other identified influenza virus with unspecified type of pneumonia: Secondary | ICD-10-CM | POA: Diagnosis present

## 2023-10-19 DIAGNOSIS — I69319 Unspecified symptoms and signs involving cognitive functions following cerebral infarction: Secondary | ICD-10-CM

## 2023-10-19 DIAGNOSIS — Z931 Gastrostomy status: Secondary | ICD-10-CM

## 2023-10-19 DIAGNOSIS — R000 Tachycardia, unspecified: Secondary | ICD-10-CM | POA: Diagnosis not present

## 2023-10-19 DIAGNOSIS — J9601 Acute respiratory failure with hypoxia: Secondary | ICD-10-CM | POA: Diagnosis present

## 2023-10-19 DIAGNOSIS — Z794 Long term (current) use of insulin: Secondary | ICD-10-CM | POA: Diagnosis not present

## 2023-10-19 DIAGNOSIS — Z743 Need for continuous supervision: Secondary | ICD-10-CM | POA: Diagnosis not present

## 2023-10-19 DIAGNOSIS — J45901 Unspecified asthma with (acute) exacerbation: Secondary | ICD-10-CM | POA: Diagnosis present

## 2023-10-19 DIAGNOSIS — E7800 Pure hypercholesterolemia, unspecified: Secondary | ICD-10-CM | POA: Diagnosis present

## 2023-10-19 DIAGNOSIS — R062 Wheezing: Secondary | ICD-10-CM | POA: Diagnosis not present

## 2023-10-19 DIAGNOSIS — Z813 Family history of other psychoactive substance abuse and dependence: Secondary | ICD-10-CM

## 2023-10-19 DIAGNOSIS — J9690 Respiratory failure, unspecified, unspecified whether with hypoxia or hypercapnia: Secondary | ICD-10-CM | POA: Diagnosis not present

## 2023-10-19 DIAGNOSIS — E119 Type 2 diabetes mellitus without complications: Secondary | ICD-10-CM

## 2023-10-19 DIAGNOSIS — Z7401 Bed confinement status: Secondary | ICD-10-CM

## 2023-10-19 DIAGNOSIS — Z888 Allergy status to other drugs, medicaments and biological substances status: Secondary | ICD-10-CM

## 2023-10-19 DIAGNOSIS — E66813 Obesity, class 3: Secondary | ICD-10-CM | POA: Diagnosis present

## 2023-10-19 DIAGNOSIS — Z993 Dependence on wheelchair: Secondary | ICD-10-CM

## 2023-10-19 DIAGNOSIS — Z79899 Other long term (current) drug therapy: Secondary | ICD-10-CM

## 2023-10-19 LAB — COMPREHENSIVE METABOLIC PANEL
ALT: 16 U/L (ref 0–44)
AST: 27 U/L (ref 15–41)
Albumin: 2.9 g/dL — ABNORMAL LOW (ref 3.5–5.0)
Alkaline Phosphatase: 91 U/L (ref 38–126)
Anion gap: 14 (ref 5–15)
BUN: 15 mg/dL (ref 8–23)
CO2: 23 mmol/L (ref 22–32)
Calcium: 8.4 mg/dL — ABNORMAL LOW (ref 8.9–10.3)
Chloride: 99 mmol/L (ref 98–111)
Creatinine, Ser: 1.35 mg/dL — ABNORMAL HIGH (ref 0.44–1.00)
GFR, Estimated: 45 mL/min — ABNORMAL LOW (ref >60)
Glucose, Bld: 156 mg/dL — ABNORMAL HIGH (ref 70–99)
Potassium: 3.7 mmol/L (ref 3.5–5.1)
Sodium: 136 mmol/L (ref 135–145)
Total Bilirubin: 0.2 mg/dL (ref 0.0–1.2)
Total Protein: 6.1 g/dL — ABNORMAL LOW (ref 6.5–8.1)

## 2023-10-19 LAB — CBC WITH DIFFERENTIAL/PLATELET
Abs Immature Granulocytes: 0.09 10*3/uL — ABNORMAL HIGH (ref 0.00–0.07)
Basophils Absolute: 0 10*3/uL (ref 0.0–0.1)
Basophils Relative: 0 %
Eosinophils Absolute: 0 10*3/uL (ref 0.0–0.5)
Eosinophils Relative: 1 %
HCT: 38 % (ref 36.0–46.0)
Hemoglobin: 12.9 g/dL (ref 12.0–15.0)
Immature Granulocytes: 1 %
Lymphocytes Relative: 15 %
Lymphs Abs: 1.1 10*3/uL (ref 0.7–4.0)
MCH: 34.1 pg — ABNORMAL HIGH (ref 26.0–34.0)
MCHC: 33.9 g/dL (ref 30.0–36.0)
MCV: 100.5 fL — ABNORMAL HIGH (ref 80.0–100.0)
Monocytes Absolute: 1 10*3/uL (ref 0.1–1.0)
Monocytes Relative: 14 %
Neutro Abs: 4.7 10*3/uL (ref 1.7–7.7)
Neutrophils Relative %: 69 %
Platelets: 246 10*3/uL (ref 150–400)
RBC: 3.78 MIL/uL — ABNORMAL LOW (ref 3.87–5.11)
RDW: 16.3 % — ABNORMAL HIGH (ref 11.5–15.5)
WBC: 7 10*3/uL (ref 4.0–10.5)
nRBC: 0 % (ref 0.0–0.2)

## 2023-10-19 LAB — URINALYSIS, W/ REFLEX TO CULTURE (INFECTION SUSPECTED)
Bilirubin Urine: NEGATIVE
Glucose, UA: NEGATIVE mg/dL
Ketones, ur: NEGATIVE mg/dL
Leukocytes,Ua: NEGATIVE
Nitrite: NEGATIVE
Protein, ur: NEGATIVE mg/dL
Specific Gravity, Urine: 1.02 (ref 1.005–1.030)
pH: 5 (ref 5.0–8.0)

## 2023-10-19 LAB — RESP PANEL BY RT-PCR (RSV, FLU A&B, COVID)  RVPGX2
Influenza A by PCR: POSITIVE — AB
Influenza B by PCR: NEGATIVE
Resp Syncytial Virus by PCR: NEGATIVE
SARS Coronavirus 2 by RT PCR: NEGATIVE

## 2023-10-19 LAB — I-STAT CG4 LACTIC ACID, ED
Lactic Acid, Venous: 3.1 mmol/L (ref 0.5–1.9)
Lactic Acid, Venous: 1.6 mmol/L (ref 0.5–1.9)

## 2023-10-19 LAB — CBC
HCT: 36.5 % (ref 36.0–46.0)
Hemoglobin: 12.3 g/dL (ref 12.0–15.0)
MCH: 33.5 pg (ref 26.0–34.0)
MCHC: 33.7 g/dL (ref 30.0–36.0)
MCV: 99.5 fL (ref 80.0–100.0)
Platelets: 245 10*3/uL (ref 150–400)
RBC: 3.67 MIL/uL — ABNORMAL LOW (ref 3.87–5.11)
RDW: 16.3 % — ABNORMAL HIGH (ref 11.5–15.5)
WBC: 6.6 10*3/uL (ref 4.0–10.5)
nRBC: 0 % (ref 0.0–0.2)

## 2023-10-19 LAB — APTT: aPTT: 27 s (ref 24–36)

## 2023-10-19 LAB — GLUCOSE, CAPILLARY: Glucose-Capillary: 88 mg/dL (ref 70–99)

## 2023-10-19 LAB — CBG MONITORING, ED
Glucose-Capillary: 100 mg/dL — ABNORMAL HIGH (ref 70–99)
Glucose-Capillary: 139 mg/dL — ABNORMAL HIGH (ref 70–99)

## 2023-10-19 LAB — HEMOGLOBIN A1C
Hgb A1c MFr Bld: 6.2 % — ABNORMAL HIGH (ref 4.8–5.6)
Mean Plasma Glucose: 131.24 mg/dL

## 2023-10-19 LAB — PHENOBARBITAL LEVEL: Phenobarbital: 23.3 ug/mL (ref 15.0–40.0)

## 2023-10-19 LAB — VALPROIC ACID LEVEL: Valproic Acid Lvl: 17 ug/mL — ABNORMAL LOW (ref 50.0–100.0)

## 2023-10-19 LAB — CREATININE, SERUM
Creatinine, Ser: 1.17 mg/dL — ABNORMAL HIGH (ref 0.44–1.00)
GFR, Estimated: 53 mL/min — ABNORMAL LOW (ref >60)

## 2023-10-19 LAB — PROTIME-INR
INR: 1.1 (ref 0.8–1.2)
Prothrombin Time: 14 s (ref 11.4–15.2)

## 2023-10-19 MED ORDER — PHENOBARBITAL 64.8 MG PO TABS
64.8000 mg | ORAL_TABLET | Freq: Once | ORAL | Status: AC
  Administered 2023-10-19: 64.8 mg via ORAL
  Filled 2023-10-19: qty 1

## 2023-10-19 MED ORDER — PHENOBARBITAL 64.8 MG PO TABS
64.8000 mg | ORAL_TABLET | Freq: Two times a day (BID) | ORAL | Status: DC
Start: 2023-10-19 — End: 2023-10-19

## 2023-10-19 MED ORDER — ACETAMINOPHEN 650 MG RE SUPP: 650.0000 mg | Freq: Four times a day (QID) | RECTAL | Status: DC | PRN

## 2023-10-19 MED ORDER — LORAZEPAM 0.5 MG PO TABS: 0.5000 mg | ORAL_TABLET | Freq: Four times a day (QID) | ORAL | Status: DC | PRN

## 2023-10-19 MED ORDER — GUAIFENESIN ER 600 MG PO TB12
600.0000 mg | ORAL_TABLET | Freq: Two times a day (BID) | ORAL | Status: DC
  Administered 2023-10-19 – 2023-10-22 (×7): 600 mg via ORAL
  Filled 2023-10-19 (×7): qty 1

## 2023-10-19 MED ORDER — PHENOBARBITAL 32.4 MG PO TABS
64.8000 mg | ORAL_TABLET | Freq: Two times a day (BID) | ORAL | Status: DC
  Administered 2023-10-19 – 2023-10-22 (×6): 64.8 mg via ORAL
  Filled 2023-10-19 (×6): qty 2

## 2023-10-19 MED ORDER — SODIUM CHLORIDE 0.9 % IV SOLN
500.0000 mg | Freq: Once | INTRAVENOUS | Status: AC
  Administered 2023-10-19: 500 mg via INTRAVENOUS
  Filled 2023-10-19: qty 5

## 2023-10-19 MED ORDER — IPRATROPIUM BROMIDE 0.02 % IN SOLN
0.5000 mg | Freq: Four times a day (QID) | RESPIRATORY_TRACT | Status: DC
  Administered 2023-10-19 – 2023-10-20 (×2): 0.5 mg via RESPIRATORY_TRACT
  Filled 2023-10-19 (×2): qty 2.5

## 2023-10-19 MED ORDER — ABACAVIR-DOLUTEGRAVIR-LAMIVUD 600-50-300 MG PO TABS
1.0000 | ORAL_TABLET | Freq: Every morning | ORAL | Status: DC
  Administered 2023-10-19 – 2023-10-22 (×4): 1 via ORAL
  Filled 2023-10-19 (×4): qty 1

## 2023-10-19 MED ORDER — ROSUVASTATIN CALCIUM 20 MG PO TABS
20.0000 mg | ORAL_TABLET | Freq: Every day | ORAL | Status: DC
  Administered 2023-10-19 – 2023-10-21 (×3): 20 mg via ORAL
  Filled 2023-10-19 (×3): qty 1

## 2023-10-19 MED ORDER — SODIUM CHLORIDE 0.9 % IV BOLUS
1000.0000 mL | Freq: Once | INTRAVENOUS | Status: AC
  Administered 2023-10-19: 1000 mL via INTRAVENOUS

## 2023-10-19 MED ORDER — OSELTAMIVIR PHOSPHATE 75 MG PO CAPS
75.0000 mg | ORAL_CAPSULE | Freq: Two times a day (BID) | ORAL | Status: DC
  Administered 2023-10-19 – 2023-10-22 (×6): 75 mg via ORAL
  Filled 2023-10-19 (×7): qty 1

## 2023-10-19 MED ORDER — SODIUM CHLORIDE 0.9 % IV SOLN
500.0000 mg | INTRAVENOUS | Status: DC
  Administered 2023-10-20: 500 mg via INTRAVENOUS
  Filled 2023-10-19: qty 5

## 2023-10-19 MED ORDER — ACETAMINOPHEN 500 MG PO TABS
1000.0000 mg | ORAL_TABLET | Freq: Once | ORAL | Status: AC
  Administered 2023-10-19: 1000 mg via ORAL
  Filled 2023-10-19: qty 2

## 2023-10-19 MED ORDER — ACETAMINOPHEN 325 MG PO TABS
650.0000 mg | ORAL_TABLET | ORAL | Status: DC | PRN
  Administered 2023-10-20 (×2): 650 mg via ORAL
  Filled 2023-10-19 (×2): qty 2

## 2023-10-19 MED ORDER — ALBUTEROL SULFATE (2.5 MG/3ML) 0.083% IN NEBU
2.5000 mg | INHALATION_SOLUTION | Freq: Four times a day (QID) | RESPIRATORY_TRACT | Status: DC
Start: 2023-10-19 — End: 2023-10-20
  Administered 2023-10-19 – 2023-10-20 (×2): 2.5 mg via RESPIRATORY_TRACT
  Filled 2023-10-19 (×2): qty 3

## 2023-10-19 MED ORDER — LACOSAMIDE 50 MG PO TABS
150.0000 mg | ORAL_TABLET | Freq: Two times a day (BID) | ORAL | Status: DC
  Administered 2023-10-19 – 2023-10-22 (×7): 150 mg via ORAL
  Filled 2023-10-19 (×7): qty 3

## 2023-10-19 MED ORDER — ENOXAPARIN SODIUM 40 MG/0.4ML IJ SOSY
40.0000 mg | PREFILLED_SYRINGE | INTRAMUSCULAR | Status: DC
  Administered 2023-10-19 – 2023-10-21 (×3): 40 mg via SUBCUTANEOUS
  Filled 2023-10-19 (×3): qty 0.4

## 2023-10-19 MED ORDER — INSULIN ASPART 100 UNIT/ML IJ SOLN
0.0000 [IU] | Freq: Three times a day (TID) | INTRAMUSCULAR | Status: DC
  Administered 2023-10-19: 2 [IU] via SUBCUTANEOUS
  Administered 2023-10-20: 1 [IU] via SUBCUTANEOUS
  Administered 2023-10-20: 3 [IU] via SUBCUTANEOUS
  Administered 2023-10-20: 1 [IU] via SUBCUTANEOUS
  Administered 2023-10-21: 2 [IU] via SUBCUTANEOUS
  Administered 2023-10-21: 5 [IU] via SUBCUTANEOUS
  Administered 2023-10-21 – 2023-10-22 (×2): 1 [IU] via SUBCUTANEOUS
  Administered 2023-10-22: 2 [IU] via SUBCUTANEOUS

## 2023-10-19 MED ORDER — DIVALPROEX SODIUM 250 MG PO DR TAB
500.0000 mg | DELAYED_RELEASE_TABLET | Freq: Three times a day (TID) | ORAL | Status: DC
  Administered 2023-10-19 – 2023-10-22 (×9): 500 mg via ORAL
  Filled 2023-10-19 (×9): qty 2

## 2023-10-19 MED ORDER — PANTOPRAZOLE SODIUM 40 MG PO TBEC
40.0000 mg | DELAYED_RELEASE_TABLET | Freq: Every day | ORAL | Status: DC
  Administered 2023-10-20 – 2023-10-22 (×3): 40 mg via ORAL
  Filled 2023-10-19 (×3): qty 1

## 2023-10-19 MED ORDER — ACETAMINOPHEN 650 MG RE SUPP
650.0000 mg | Freq: Four times a day (QID) | RECTAL | Status: DC | PRN
Start: 2023-10-19 — End: 2023-10-22

## 2023-10-19 MED ORDER — OSELTAMIVIR PHOSPHATE 75 MG PO CAPS
75.0000 mg | ORAL_CAPSULE | Freq: Once | ORAL | Status: AC
  Administered 2023-10-19: 75 mg via ORAL
  Filled 2023-10-19: qty 1

## 2023-10-19 MED ORDER — SODIUM CHLORIDE 0.9 % IV SOLN
2.0000 g | Freq: Once | INTRAVENOUS | Status: AC
  Administered 2023-10-19: 2 g via INTRAVENOUS
  Filled 2023-10-19: qty 20

## 2023-10-19 MED ORDER — SERTRALINE HCL 100 MG PO TABS
100.0000 mg | ORAL_TABLET | Freq: Every day | ORAL | Status: DC
  Administered 2023-10-19 – 2023-10-21 (×3): 100 mg via ORAL
  Filled 2023-10-19 (×3): qty 1

## 2023-10-19 MED ORDER — SODIUM CHLORIDE 0.9 % IV SOLN: INTRAVENOUS | Status: AC

## 2023-10-19 MED ORDER — LEVETIRACETAM 500 MG PO TABS
1000.0000 mg | ORAL_TABLET | Freq: Two times a day (BID) | ORAL | Status: DC
  Administered 2023-10-19 – 2023-10-22 (×7): 1000 mg via ORAL
  Filled 2023-10-19 (×7): qty 2

## 2023-10-19 MED ORDER — DOLUTEGRAVIR SODIUM 50 MG PO TABS
50.0000 mg | ORAL_TABLET | Freq: Every day | ORAL | Status: DC
  Administered 2023-10-19 – 2023-10-21 (×3): 50 mg via ORAL
  Filled 2023-10-19 (×5): qty 1

## 2023-10-19 MED ORDER — SODIUM CHLORIDE 0.9 % IV SOLN
2.0000 g | INTRAVENOUS | Status: DC
  Administered 2023-10-20: 2 g via INTRAVENOUS
  Filled 2023-10-19: qty 20

## 2023-10-19 MED ORDER — ACETAMINOPHEN 325 MG PO TABS
650.0000 mg | ORAL_TABLET | Freq: Four times a day (QID) | ORAL | Status: DC | PRN
  Administered 2023-10-19: 650 mg via ORAL
  Filled 2023-10-19: qty 2

## 2023-10-19 MED ORDER — LACTULOSE 10 GM/15ML PO SOLN
20.0000 g | Freq: Three times a day (TID) | ORAL | Status: DC
  Administered 2023-10-19 – 2023-10-21 (×8): 20 g via ORAL
  Filled 2023-10-19 (×8): qty 30

## 2023-10-19 MED ORDER — METHYLPREDNISOLONE SODIUM SUCC 40 MG IJ SOLR
40.0000 mg | Freq: Two times a day (BID) | INTRAMUSCULAR | Status: DC
  Administered 2023-10-19 – 2023-10-21 (×5): 40 mg via INTRAVENOUS
  Filled 2023-10-19 (×5): qty 1
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
## 2023-10-19 NOTE — ED Notes (Signed)
Pt resting in bed. HOB 30 degrees. VS updated. Call light in reach.
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
## 2023-10-19 NOTE — ED Notes (Signed)
Error in med given cbg 135  I read 185  I gave the pt 2 units of insulin instead of 1 unit sq  pharmacy notified and the admitting notified also  order was just to  monitor it.

## 2023-10-19 NOTE — ED Notes (Signed)
Pt resting in bed. On cardiac monitor. MD at bedside. Temp updated. Call light in reach. Family at bedside.

## 2023-10-19 NOTE — Progress Notes (Signed)
MEWS Progress Note  Patient Details Name: Susan [name] MRN: [id number] DOB: Jul 24, 1961 Today's Date: 10/19/2023   MEWS Flowsheet Documentation:  Assess: MEWS Score Temp: 99.1 F (37.3 C) BP: 128/60 MAP (mmHg): 78 Pulse Rate: (!) 102 ECG Heart Rate: (!) 105 Resp: (!) 21 Level of Consciousness: Alert SpO2: 98 % O2 Device: Nasal Cannula Patient Activity (if Appropriate): In bed O2 Flow Rate (L/min): 4 L/min Assess: MEWS Score MEWS Temp: 0 MEWS Systolic: 0 MEWS Pulse: 1 MEWS RR: 1 MEWS LOC: 0 MEWS Score: 2 MEWS Score Color: Yellow Assess: SIRS CRITERIA SIRS Temperature : 0 SIRS Respirations : 1 SIRS Pulse: 1 SIRS WBC: 0 SIRS Score Sum : 2 Assess: if the MEWS score is Yellow or Red Were vital signs accurate and taken at a resting state?: Yes Does the patient meet 2 or more of the SIRS criteria?: Yes Does the patient have a confirmed or suspected source of infection?: No MEWS guidelines implemented : Yes, yellow Treat MEWS Interventions: Considered administering scheduled or prn medications/treatments as ordered Take Vital Signs Increase Vital Sign Frequency : Yellow: Q2hr x1, continue Q4hrs until patient remains green for 12hrs Escalate MEWS: Escalate: Yellow: Discuss with charge nurse and consider notifying provider and/or RRT Notify: Charge Nurse/RN Name of Charge Nurse/RN Notified: Marissa, RN Provider Notification Provider Name/Title: Arvilla Market, MD Date Provider Notified: 10/19/23 Time Provider Notified: 2032 Method of Notification: Page Notification Reason: Other (Comment) (Pt Yellow MEWS protocol) Provider response: No new orders Date of Provider Response: 10/19/23 Time of Provider Response: 2032      Shalini Mair Blanche C Cindra Austad 10/19/2023, 8:52 PM

## 2023-10-19 NOTE — Progress Notes (Signed)
IV team to bedside to assess for vascular access needs. No needs identified at this time. IV team to return if patient condition necessitates per primary RN.

## 2023-10-19 NOTE — Progress Notes (Signed)
 Pt being followed by [facility] for Sepsis protocol.

## 2023-10-19 NOTE — ED Triage Notes (Signed)
Patient BIBA from home due to irregular breathing. Gave breathing treatment due to wheezIng per family. With EMS on scene lung sounds were clear but labored breathing. Hypotensive in the 80s with EMS. Arrival to hospital SBP-90s. HR-100. BGL-210. Requring 4 L O2 and sitting 95%. Was sitting 88% on RA. Baseline confusion due to previous stroke.

## 2023-10-19 NOTE — Sepsis Progress Note (Signed)
 Notified bedside nurse of need to draw repeat lactic acid.

## 2023-10-19 NOTE — ED Notes (Signed)
 Pt resting in bed. No needs at this time. Call light in reach
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
## 2023-10-19 NOTE — ED Notes (Signed)
Pt resting in bed. No needs at this time. Call light in reach. Temp updated.

## 2023-10-19 NOTE — ED Notes (Signed)
Admitting rn on 3w notified also

## 2023-10-20 DIAGNOSIS — J9601 Acute respiratory failure with hypoxia: Secondary | ICD-10-CM

## 2023-10-20 LAB — BLOOD CULTURE ID PANEL (REFLEXED) - BCID2

## 2023-10-20 LAB — GLUCOSE, CAPILLARY
Glucose-Capillary: 134 mg/dL — ABNORMAL HIGH (ref 70–99)
Glucose-Capillary: 125 mg/dL — ABNORMAL HIGH (ref 70–99)
Glucose-Capillary: 207 mg/dL — ABNORMAL HIGH (ref 70–99)
Glucose-Capillary: 220 mg/dL — ABNORMAL HIGH (ref 70–99)

## 2023-10-20 LAB — BASIC METABOLIC PANEL
Anion gap: 16 — ABNORMAL HIGH (ref 5–15)
Anion gap: 13 (ref 5–15)
BUN: 12 mg/dL (ref 8–23)
BUN: 12 mg/dL (ref 8–23)
CO2: 23 mmol/L (ref 22–32)
CO2: 24 mmol/L (ref 22–32)
Calcium: 8.9 mg/dL (ref 8.9–10.3)
Calcium: 8.1 mg/dL — ABNORMAL LOW (ref 8.9–10.3)
Chloride: 99 mmol/L (ref 98–111)
Chloride: 101 mmol/L (ref 98–111)
Creatinine, Ser: 0.94 mg/dL (ref 0.44–1.00)
Creatinine, Ser: 0.86 mg/dL (ref 0.44–1.00)
GFR, Estimated: >60 mL/min (ref >60)
GFR, Estimated: >60 mL/min (ref >60)
Glucose, Bld: 119 mg/dL — ABNORMAL HIGH (ref 70–99)
Glucose, Bld: 112 mg/dL — ABNORMAL HIGH (ref 70–99)
Potassium: 4.4 mmol/L (ref 3.5–5.1)
Potassium: 4 mmol/L (ref 3.5–5.1)
Sodium: 138 mmol/L (ref 135–145)
Sodium: 138 mmol/L (ref 135–145)

## 2023-10-20 LAB — CBC
HCT: 37.3 % (ref 36.0–46.0)
Hemoglobin: 12.6 g/dL (ref 12.0–15.0)
MCH: 33.3 pg (ref 26.0–34.0)
MCHC: 33.8 g/dL (ref 30.0–36.0)
MCV: 98.7 fL (ref 80.0–100.0)
Platelets: UNDETERMINED 10*3/uL (ref 150–400)
RBC: 3.78 MIL/uL — ABNORMAL LOW (ref 3.87–5.11)
RDW: 16.3 % — ABNORMAL HIGH (ref 11.5–15.5)
WBC: 5.4 10*3/uL (ref 4.0–10.5)
nRBC: 0.7 % — ABNORMAL HIGH (ref 0.0–0.2)

## 2023-10-20 LAB — PROCALCITONIN: Procalcitonin: <0.1 ng/mL

## 2023-10-20 LAB — MAGNESIUM: Magnesium: 1.9 mg/dL (ref 1.7–2.4)

## 2023-10-20 LAB — C-REACTIVE PROTEIN: CRP: 5.6 mg/dL — ABNORMAL HIGH (ref <1.0)

## 2023-10-20 MED ORDER — NICOTINE 14 MG/24HR TD PT24
14.0000 mg | MEDICATED_PATCH | Freq: Every day | TRANSDERMAL | Status: DC
  Administered 2023-10-20 – 2023-10-22 (×3): 14 mg via TRANSDERMAL
  Filled 2023-10-20 (×3): qty 1

## 2023-10-20 MED ORDER — SODIUM CHLORIDE 0.9 % IV SOLN
3.0000 g | Freq: Four times a day (QID) | INTRAVENOUS | Status: DC
  Administered 2023-10-20 – 2023-10-21 (×5): 3 g via INTRAVENOUS
  Filled 2023-10-20 (×5): qty 8

## 2023-10-20 MED ORDER — IPRATROPIUM-ALBUTEROL 0.5-2.5 (3) MG/3ML IN SOLN
3.0000 mL | Freq: Four times a day (QID) | RESPIRATORY_TRACT | Status: DC
  Administered 2023-10-20 – 2023-10-22 (×9): 3 mL via RESPIRATORY_TRACT
  Filled 2023-10-20 (×8): qty 3

## 2023-10-20 NOTE — Plan of Care (Signed)
  Problem: Metabolic: Goal: Ability to maintain appropriate glucose levels will improve Outcome: Progressing   Problem: Nutritional: Goal: Maintenance of adequate nutrition will improve Outcome: Progressing   Problem: Education: Goal: Knowledge of General Education information will improve Description: Including pain rating scale, medication(s)/side effects and non-pharmacologic comfort measures Outcome: Progressing   Problem: Clinical Measurements: Goal: Ability to maintain clinical measurements within normal limits will improve Outcome: Progressing   Problem: Activity: Goal: Risk for activity intolerance will decrease Outcome: Progressing   Problem: Nutrition: Goal: Adequate nutrition will be maintained Outcome: Progressing   Problem: Elimination: Goal: Will not experience complications related to bowel motility Outcome: Progressing   Problem: Safety: Goal: Ability to remain free from injury will improve Outcome: Progressing   Problem: Skin Integrity: Goal: Risk for impaired skin integrity will decrease Outcome: Progressing

## 2023-10-20 NOTE — Plan of Care (Signed)
Ongoing fever, bld cxr and sputum cxr ordered. Conts on IV abx, weaned off oxygen, ongoing care plan.   Problem: Coping: Goal: Ability to adjust to condition or change in health will improve Outcome: Not Met (add Reason)   Problem: Fluid Volume: Goal: Ability to maintain a balanced intake and output will improve Outcome: Not Met (add Reason)   Problem: Skin Integrity: Goal: Risk for impaired skin integrity will decrease Outcome: Not Met (add Reason)   Problem: Tissue Perfusion: Goal: Adequacy of tissue perfusion will improve Outcome: Not Met (add Reason)   Problem: Coping: Goal: Level of anxiety will decrease Outcome: Not Met (add Reason)   Problem: Nutrition: Goal: Adequate nutrition will be maintained Outcome: Not Met (add Reason)   Problem: Pain Managment: Goal: General experience of comfort will improve and/or be controlled Outcome: Not Met (add Reason)

## 2023-10-20 NOTE — Progress Notes (Signed)
PHARMACY - PHYSICIAN COMMUNICATION CRITICAL VALUE ALERT - BLOOD CULTURE IDENTIFICATION (BCID)  Susan [name] is an 64 y.o. female who presented to [facility] on 10/19/2023 with a chief complaint of SOB  Assessment: staph epi w/o resistance in 1 of 4.   Name of physician (or Provider) Contacted: Dr. Mahala Menghini  Current antibiotics: CTX/azith, note also on Triume[facility] for ART  Changes to prescribed antibiotics recommended:  Pt LA resolved and defervesced with CAP regimen, would defer escalation unless change in clinical status as suspect contaminan   Results for orders placed or performed during the hospital encounter of 10/19/23  Blood Culture ID Panel (Reflexed) (Collected: 10/19/2023  6:17 AM)  Result Value Ref Range   Enterococcus faecalis NOT DETECTED NOT DETECTED   Enterococcus Faecium NOT DETECTED NOT DETECTED   Listeria monocytogenes NOT DETECTED NOT DETECTED   Staphylococcus species DETECTED (A) NOT DETECTED   Staphylococcus aureus (BCID) NOT DETECTED NOT DETECTED   Staphylococcus epidermidis DETECTED (A) NOT DETECTED   Staphylococcus lugdunensis NOT DETECTED NOT DETECTED   Streptococcus species NOT DETECTED NOT DETECTED   Streptococcus agalactiae NOT DETECTED NOT DETECTED   Streptococcus pneumoniae NOT DETECTED NOT DETECTED   Streptococcus pyogenes NOT DETECTED NOT DETECTED   A.calcoaceticus-baumannii NOT DETECTED NOT DETECTED   Bacteroides fragilis NOT DETECTED NOT DETECTED   Enterobacterales NOT DETECTED NOT DETECTED   Enterobacter cloacae complex NOT DETECTED NOT DETECTED   Escherichia coli NOT DETECTED NOT DETECTED   Klebsiella aerogenes NOT DETECTED NOT DETECTED   Klebsiella oxytoca NOT DETECTED NOT DETECTED   Klebsiella pneumoniae NOT DETECTED NOT DETECTED   Proteus species NOT DETECTED NOT DETECTED   Salmonella species NOT DETECTED NOT DETECTED   Serratia marcescens NOT DETECTED NOT DETECTED   Haemophilus influenzae NOT DETECTED NOT DETECTED   Neisseria meningitidis  NOT DETECTED NOT DETECTED   Pseudomonas aeruginosa NOT DETECTED NOT DETECTED   Stenotrophomonas maltophilia NOT DETECTED NOT DETECTED   Candida albicans NOT DETECTED NOT DETECTED   Candida auris NOT DETECTED NOT DETECTED   Candida glabrata NOT DETECTED NOT DETECTED   Candida krusei NOT DETECTED NOT DETECTED   Candida parapsilosis NOT DETECTED NOT DETECTED   Candida tropicalis NOT DETECTED NOT DETECTED   Cryptococcus neoformans/gattii NOT DETECTED NOT DETECTED   Methicillin resistance mecA/C NOT DETECTED NOT DETECTED    Susan [name] 10/20/2023  8:07 AM

## 2023-10-20 NOTE — Evaluation (Signed)
Physical Therapy Evaluation and Discharge Patient Details Name: Susan [name] MRN: [id number] DOB: March 13, 1961 Today's Date: 10/20/2023  History of Present Illness  Pt is a 63 y.o. female presenting 2/15 from home with shortness of breath and fever. Found to have influenza A. CXR without evidence of cardiopulmonary disease. PMH signficant for Schizophrenia, Seizures, HIV, DM-2, CVA in 2021 with chronic cognitive/language deficits, nonambulatory-bed to wheelchair bound for the past 2-3 years.  Clinical Impression   Patient evaluated by Physical Therapy with no further acute PT needs identified. Anticipate pt is at her baseline, although no answer when attempting to call family to confirm. PT is signing off. Thank you for this referral.         If plan is discharge home, recommend the following: A little help with walking and/or transfers;Assistance with cooking/housework;Direct supervision/assist for medications management;Direct supervision/assist for financial management;Assist for transportation;Supervision due to cognitive status   Can travel by private vehicle        Equipment Recommendations None recommended by PT  Recommendations for Other Services       Functional Status Assessment Patient has not had a recent decline in their functional status     Precautions / Restrictions Precautions Precautions: Fall Recall of Precautions/Restrictions:  (poor safety awareness) Restrictions Weight Bearing Restrictions Per Provider Order: No      Mobility  Bed Mobility Overal bed mobility: Needs Assistance Bed Mobility: Supine to Sit, Sit to Supine     Supine to sit: Min assist Sit to supine: Min assist   General bed mobility comments: multimodal cues for sequencing and assist to elevate trunk/bring hips out toward EOB. Min A for multimodal cues to optimize safety with return to supine    Transfers Overall transfer level: Needs assistance Equipment used: Rolling walker (2  wheels), None Transfers: Sit to/from Stand Sit to Stand: Min assist           General transfer comment: min A for steadying after rise due to anterior lean; Improved with RW, but remains min A; pt stood impulsively 4 times from EOB    Ambulation/Gait               General Gait Details: NA per chart and pt she uses a wheelchair  Stairs            Wheelchair Mobility     Tilt Bed    Modified Rankin (Stroke Patients Only)       Balance Overall balance assessment: Needs assistance Sitting-balance support: No upper extremity supported, Feet supported Sitting balance-Leahy Scale: Good Sitting balance - Comments: statically and able to lean forward and return to midline; noted however to fatigue with time needing one cue for upright position   Standing balance support: Bilateral upper extremity supported, During functional activity Standing balance-Leahy Scale: Poor Standing balance comment: reliant on RW                             Pertinent Vitals/Pain Pain Assessment Pain Assessment: Faces Faces Pain Scale: No hurt Breathing: normal Negative Vocalization: none Facial Expression: smiling or inexpressive Body Language: relaxed Consolability: no need to console PAINAD Score: 0    Home Living                     Additional Comments: per chart review from 2021, pt was living with children who could provide 24/7 assist in a house with a tub shower. Unsure accuracy of report  as from 2021.    Prior Function Prior Level of Function : Needs assist             Mobility Comments: per chart review, pt wheelchair/bed bound for 2-3 years. Pt reports she transfers to wheelchair with walker and then is able to propel herself around with LEs ADLs Comments: Pt reports daughter assists with LB AD; unclear if also assists with UB ADL     Extremity/Trunk Assessment   Upper Extremity Assessment Upper Extremity Assessment: Defer to OT  evaluation RUE Deficits / Details: contractures to ~90 degrees at PIP J digits 2-5    Lower Extremity Assessment Lower Extremity Assessment: Overall WFL for tasks assessed    Cervical / Trunk Assessment Cervical / Trunk Assessment: Other exceptions (habitus) Cervical / Trunk Exceptions: overweight;  Communication   Communication Communication: Impaired Factors Affecting Communication: Difficulty expressing self (per chart, chronic cognitive/language deficits)    Cognition Arousal: Alert Behavior During Therapy: Flat affect, Impulsive                             Following commands: Impaired Following commands impaired:  (intermittently needing additional commands and cues to follow one step commands. Follows ~70% without additional cueing. Unsure if hard of hearing)     Cueing Cueing Techniques: Verbal cues, Gestural cues, Tactile cues     General Comments General comments (skin integrity, edema, etc.): mild dyspnea after standing; HR 106 bpm and sats 92% on RA    Exercises     Assessment/Plan    PT Assessment Patient does not need any further PT services  PT Problem List         PT Treatment Interventions      PT Goals (Current goals can be found in the Care Plan section)  Acute Rehab PT Goals PT Goal Formulation: All assessment and education complete, DC therapy    Frequency       Co-evaluation PT/OT/SLP Co-Evaluation/Treatment: Yes Reason for Co-Treatment: Complexity of the patient's impairments (multi-system involvement);For patient/therapist safety;To address functional/ADL transfers PT goals addressed during session: Mobility/safety with mobility;Balance;Proper use of DME         AM-PAC PT "6 Clicks" Mobility  Outcome Measure Help needed turning from your back to your side while in a flat bed without using bedrails?: None Help needed moving from lying on your back to sitting on the side of a flat bed without using bedrails?: A Little Help  needed moving to and from a bed to a chair (including a wheelchair)?: A Little Help needed standing up from a chair using your arms (e.g., wheelchair or bedside chair)?: A Little Help needed to walk in hospital room?: Total Help needed climbing 3-5 steps with a railing? : Total 6 Click Score: 15    End of Session   Activity Tolerance: Patient tolerated treatment well Patient left: in bed;with call bell/phone within reach;with bed alarm set Nurse Communication: Mobility status PT Visit Diagnosis: Difficulty in walking, not elsewhere classified (R26.2)    Time: 5621-3086 PT Time Calculation (min) (ACUTE ONLY): 24 min   Charges:   PT Evaluation $PT Eval Low Complexity: 1 Low   PT General Charges $$ ACUTE PT VISIT: 1 Visit          Jerolyn Center, PT Acute Rehabilitation Services  Office [phone]   Zena Amos 10/20/2023, 2:51 PM

## 2023-10-20 NOTE — Consult Note (Signed)
WOC Nurse Consult Note: Reason for Consult:heel wound Wound type: discussed with patient's nurse, reported to be dry skin no wound Pressure Injury POA: Yes Measurement: see nursing flow sheets Wound [id number] tissue Drainage (amount, consistency, odor) none Periwound: intact  Dressing procedure/placement/frequency: Silicone foam Offload heel   Re consult if needed, will not follow at this time. Thanks  Virgene Tirone [employer], RN,CWOCN, CNS, CWON-AP [phone])

## 2023-10-20 NOTE — Progress Notes (Signed)
TRH ROUNDING  NOTE OMAH DEWALT BMW:[id number]  DOB: Oct 27, 1960  DOA: 10/19/2023  PCP: Moshe Cipro, FNP  10/20/2023,7:42 AM  LOS: 1 day   Code Status: Full code from: Home current Dispo: Unclear   63 year old black female  CVA 2021 with persistent cognitive/language deficits and bedbound state and chronically wheelchair-bound--previous lengthy hospital stay 11/11/2019 01/09/2020 respiratory failure septic shock intubation tracheostomy and PEG placement decannulated 12/30/2019 DM TY 2 Severe schizophrenia Seizures Known HIV  HFrEF-HFmpEF last echo 11/17/2019 EF 55-60% normal valves Has had previous PRES syndrome causing encephalopathy and is supposed to be on Keppra  very difficult to communicate with given massive strokes in the past --daughter historian  2/15 presented from home irregular breathing given breathing treatment hypotensive in 80s, heart rate 100s requiring 4 L as was satting 88% on room air-- Tmax at home was 102 sodium 136 potassium 3.7 BUN/creatinine 15/1.3 lactic acid 3.1  WBC 7.0 hemoglobin 12.9 platelet 246---Influenza A positive on arrival  portable CXR no radiographic evidence of acute cardiopulmonary process catheter sclerosis  given 2 L of fluid started on Tamiflu antibiotics    Plan  Sepsis on admission secondary to acute influenza "A"  Empiric management with Tamiflu 75 twice daily- Culture 1/4 staph epi presumed contaminant--see below discussion Pro-calcitonin neg --CRP elevated to 5 has an acute phase reactant Continue Solu-Medrol 40 every 12, saline 40 cc/H Can use empiric Mucinex 600 twice daily and DuoNebs as needed Sepsis physiology resolving-blood pressure slightly improving   Hypoxic respiratory failure Secondary to influenza sepsis  ? aspiration-febrile-spiking fever 101.5 per nursing today Would keep on Unasyn-repeat blood culture X1, obtain sputum culture if able--- obtain x-ray tomorrow morning Watch sats carefully  AKI on  admission Likely secondary to sepsis-holding diuretic as below Monitor trends in the next 24 hours with labs, fluids as above  Massive CVA 2021 with lengthy hospital stay previously previous trach PEG and persisting cognitive and language deficits Chronically wheelchair-bound She does have seizure disorder secondary to this additionally At discharge resume statin-unclear why not on aspirin-outpatient follow-up  Htn Antihypertensives amlodipine 10 clonidine 0.2 twice daily Maxide 1 daily all held on admit-reviewed trends over the next 24 hours and may reimplement some of them  DM TY 2 Lantus 15 units held, metformin 500 twice daily held CBGs ranging 80s to 130s-continues on sliding scale coverage  Generalized anxiety, prior schizophrenia Takes sertraline 100 at bedtime and also takes Ativan for screaming spells at night Would continue phenobarbital 64.8 twice daily, Depakote 500 3 times daily, Vimpat 150 twice daily, Keppra 1000 twice daily--- phenobarb therapeutic 823 valproate slightly low at 17  Dysphagia secondary to above Maintained on dysphagia 2 diet-watch for risk of aspiration SLP engaged to assess  HIV follows with Dr. Meredith Pel not appear to have been seen since May 2024 Continues Triumeq 1 daily, Tivicay 50 at bedtime CD4 count pending at this time Will need outpatient follow up coordination c ID   DVT prophylaxis: Lovenox  Status is: Inpatient Remains inpatient appropriate because: Requires further hospital stay   Subjective: Nursing reports not really eating or drinking-she had a Tmax of 101 She has no chest pain currently but is confused tells me that she is with her daughter and grandchildren at home currently-cannot maintain place here   Objective + exam Vitals:   10/19/23 2100 10/20/23 0032 10/20/23 0136 10/20/23 0440  BP:  (!) 157/79  (!) 152/74  Pulse:  (!) 116  (!) 112  Resp:  (!) 22  20  Temp:  99.1 F (37.3 C)  99 F (37.2 C)  TempSrc:  Oral   Oral  SpO2: 97% 97% 96% 98%  Weight:      Height:       Filed Weights   10/19/23 0618  Weight: 127.9 kg    Examination: EOMI NCAT thick neck Mallampati 4 ROM intact Coarse breath sounds posterior laterally S1-S2 no murmur no rub no gallop Sinus with PVCs and 4-6 beats of SVT this morning Abdomen obese nontender no rebound no guarding ROM intact but limited by patient effort cannot examine complete neuro does follow commands and raise his hands above bed  Data Reviewed: reviewed   CBC    Component Value Date/Time   WBC 6.6 10/19/2023 1138   RBC 3.67 (L) 10/19/2023 1138   HGB 12.3 10/19/2023 1138   HCT 36.5 10/19/2023 1138   PLT 245 10/19/2023 1138   MCV 99.5 10/19/2023 1138   MCH 33.5 10/19/2023 1138   MCHC 33.7 10/19/2023 1138   RDW 16.3 (H) 10/19/2023 1138   LYMPHSABS 1.1 10/19/2023 0625   MONOABS 1.0 10/19/2023 0625   EOSABS 0.0 10/19/2023 0625   BASOSABS 0.0 10/19/2023 0625      Latest Ref Rng & Units 10/20/2023   12:52 AM 10/19/2023   11:38 AM 10/19/2023    6:25 AM  CMP  Glucose 70 - 99 mg/dL 161   096   BUN 8 - 23 mg/dL 12   15   Creatinine 0.45 - 1.00 mg/dL 4.09  8.11  9.14   Sodium 135 - 145 mmol/L 138   136   Potassium 3.5 - 5.1 mmol/L 4.4   3.7   Chloride 98 - 111 mmol/L 99   99   CO2 22 - 32 mmol/L 23   23   Calcium 8.9 - 10.3 mg/dL 8.9   8.4   Total Protein 6.5 - 8.1 g/dL   6.1   Total Bilirubin 0.0 - 1.2 mg/dL   0.2   Alkaline Phos 38 - 126 U/L   91   AST 15 - 41 U/L   27   ALT 0 - 44 U/L   16     Scheduled Meds:  abacavir-dolutegravir-lamiVUDine  1 tablet Oral q AM   divalproex  500 mg Oral TID   dolutegravir  50 mg Oral QHS   enoxaparin (LOVENOX) injection  40 mg Subcutaneous Q24H   guaiFENesin  600 mg Oral BID   insulin aspart  0-9 Units Subcutaneous TID WC   ipratropium-albuterol  3 mL Nebulization Q6H   lacosamide  150 mg Oral BID   lactulose  20 g Oral TID PC   levETIRAcetam  1,000 mg Oral BID   methylPREDNISolone (SOLU-MEDROL)  injection  40 mg Intravenous Q12H   oseltamivir  75 mg Oral BID   pantoprazole  40 mg Oral Daily   PHENobarbital  64.8 mg Oral BID   rosuvastatin  20 mg Oral QHS   sertraline  100 mg Oral QHS   Continuous Infusions:  sodium chloride Stopped (10/19/23 1851)   azithromycin     cefTRIAXone (ROCEPHIN)  IV 2 g (10/20/23 0612)    Time  65  Rhetta Mura, MD  [facility]

## 2023-10-20 NOTE — Progress Notes (Signed)
Patient is diaphoretic, temp >101. Tylenol administered earlier. Provider at bedside, to order blood CXR x1.

## 2023-10-20 NOTE — Progress Notes (Addendum)
Pharmacy Antibiotic Note  Susan [name] is a 63 y.o. female admitted on 10/19/2023 with concern for sepsis and possible aspiration pneumonia.  Patient also has a history of HIV and is on Triumeq ART. Of note, patient is flu positive and is being managed with tamiflu. Pharmacy has been consulted to broaden coverage from Ceftriaxone and Azithromycin to Unsayn dosing due to new fever and staph epi (1 of 4) bacteremia while on current therapy.   Renal function stable at Scr 0.86. Febrile with tmax 101.5 today. WBC wnl.  Lactic acid resolving 3.1 > 1.6  Plan: Initiate Unasyn 3g IV q6h Follow up LOT  Discontinue Ceftriaxone and Azithromycin  Monitor resolution of fever and clinical status  Follow up bcx   Height: 5\' 2"  (157.5 cm) Weight: 127.9 kg (282 lb) IBW/kg (Calculated) : 50.1  Temp (24hrs), Avg:99.7 F (37.6 C), Min:98.9 F (37.2 C), Max:101.5 F (38.6 C)  Recent Labs  Lab 10/19/23 0625 10/19/23 0646 10/19/23 0948 10/19/23 1138 10/20/23 0052 10/20/23 0750  WBC 7.0  --   --  6.6  --  5.4  CREATININE 1.35*  --   --  1.17* 0.94 0.86  LATICACIDVEN  --  3.1* 1.6  --   --   --     Estimated Creatinine Clearance: 88.1 mL/min (by C-G formula based on SCr of 0.86 mg/dL).    Allergies  Allergen Reactions   Lyrica [Pregabalin] Swelling    Has LE swelling    Antimicrobials this admission: Unasyn 2/16 >  CRO 2/15 >2/16 Azithro 2/15 > 2/16 Tamiflu 2/15 > 2/20   Microbiology results: 2/15 Resp PCR: Flu + 2/15 bcx: staph epi (1 of 4)   Thank you for allowing pharmacy to be a part of this patient's care.  Adolfo Granieri 10/20/2023 10:54 AM

## 2023-10-20 NOTE — Evaluation (Signed)
Occupational Therapy Evaluation Patient Details Name: Susan [name] MRN: [id number] DOB: October 05, 1960 Today's Date: 10/20/2023   History of Present Illness   Pt is a 63 y.o. female presenting 2/15 from home with shortness of breath and fever. Found to have influenza A. CXR without evidence of cardiopulmonary disease. PMH signficant for Schizophrenia, Seizures, HIV, DM-2, CVA in 2021 with chronic cognitive/language deficits, nonambulatory-bed to wheelchair bound for the past 2-3 years.     Clinical Impressions Per chart, PTA, pt lived at home with family and spent her time in a bed or wheelchair. Pt reports daughter assists with ADL but did not specify to what extent. Based on chart review, feel pt is at or very close to her baseline. Pt able to transfer this session with up to min A and use of RW. Attempted to call daughter and son numbers in chart to confirm baseline and need for equipment, but no answer. Due to pt likely at baseline, OT to sign off with recommendation  for family to assist and provide 24/7 assist at home.     If plan is discharge home, recommend the following:   A little help with walking and/or transfers;A lot of help with bathing/dressing/bathroom;Assistance with cooking/housework;Assist for transportation;Help with stairs or ramp for entrance;Direct supervision/assist for financial management;Direct supervision/assist for medications management;Assistance with feeding     Functional Status Assessment   Patient has had a recent decline in their functional status and demonstrates the ability to make significant improvements in function in a reasonable and predictable amount of time.     Equipment Recommendations   None recommended by OT (attempted to call family to assure pt has needed DME, but no answer.)     Recommendations for Other Services         Precautions/Restrictions   Precautions Precautions: Fall Recall of Precautions/Restrictions:  (poor  safety awareness) Restrictions Weight Bearing Restrictions Per Provider Order: No     Mobility Bed Mobility Overal bed mobility: Needs Assistance Bed Mobility: Supine to Sit, Sit to Supine     Supine to sit: Min assist Sit to supine: Min assist   General bed mobility comments: multimodal cues for sequencing and assist to elevate trunk/bring hips out toward EOB. Min A for multimodal cues to optimize safety with return to supine    Transfers Overall transfer level: Needs assistance Equipment used: Rolling walker (2 wheels), None Transfers: Sit to/from Stand Sit to Stand: Min assist           General transfer comment: min A for steadying after rise. Improved with RW, but remains min A      Balance Overall balance assessment: Needs assistance Sitting-balance support: No upper extremity supported, Feet supported Sitting balance-Leahy Scale: Good Sitting balance - Comments: statically and able to lean forward and return to midline; noted however to fatigue with time needing one cue for upright position   Standing balance support: Bilateral upper extremity supported, During functional activity Standing balance-Leahy Scale: Poor Standing balance comment: reliant on RW                           ADL either performed or assessed with clinical judgement   ADL Overall ADL's : Needs assistance/impaired Eating/Feeding: Minimal assistance;Sitting   Grooming: Minimal assistance;Sitting   Upper Body Bathing: Minimal assistance;Sitting   Lower Body Bathing: Sit to/from stand;Maximal assistance   Upper Body Dressing : Moderate assistance;Sitting   Lower Body Dressing: Total assistance;Sit to/from stand  Toilet Transfer: Minimal assistance;Stand-pivot;Rolling walker (2 wheels);BSC/3in1 Toilet Transfer Details (indicate cue type and reason): simulated         Functional mobility during ADLs: Minimal assistance;Rolling walker (2 wheels)       Vision Patient  Visual Report: Other (comment) (pt unable to report.) Additional Comments: inconsistent eye contact needing increased time to make eyc contact; eyes with mild additional eye shifts intermittently     Perception Perception: Not tested       Praxis Praxis: Not tested       Pertinent Vitals/Pain Pain Assessment Pain Assessment: Faces Faces Pain Scale: No hurt Pain Intervention(s): Monitored during session     Extremity/Trunk Assessment Upper Extremity Assessment Upper Extremity Assessment: Generalized weakness;RUE deficits/detail RUE Deficits / Details: contractures to ~90 degrees at PIP J digits 2-5   Lower Extremity Assessment Lower Extremity Assessment: Defer to PT evaluation   Cervical / Trunk Assessment Cervical / Trunk Assessment: Other exceptions (habitus)   Communication Communication Communication: Impaired Factors Affecting Communication: Difficulty expressing self (per chart, chronic cognitive/language deficits)   Cognition Arousal: Alert Behavior During Therapy: Flat affect, Impulsive Cognition: No family/caregiver present to determine baseline, History of cognitive impairments             OT - Cognition Comments: presume history of cognitive impairments at baseline per chart review from 2021 therapy notes and hx of schizophrenia and seixures.                 Following commands: Impaired Following commands impaired:  (intermittently needing additional commands and cues to follow one step commands. Follows ~70% without additional cueing. Unsure if hard of hearing)     Cueing  General Comments   Cueing Techniques: Verbal cues;Gestural cues;Tactile cues      Exercises     Shoulder Instructions      Home Living                                   Additional Comments: per chart review from 2021, pt was living with children who could provide 24/7 assist in a house with a tub shower. Unsure accuracy of report as from 2021.       Prior Functioning/Environment Prior Level of Function : Needs assist             Mobility Comments: per chart review, pt wheelchair/bed bound for 2-3 years. Pt reports she transfers to wheelchair with walker and then is able to propel herself around with LEs ADLs Comments: Pt reports daughter assists with LB AD; unclear if also assists with UB ADL    OT Problem List: Decreased strength;Decreased activity tolerance;Impaired balance (sitting and/or standing)   OT Treatment/Interventions:        OT Goals(Current goals can be found in the care plan section)   Acute Rehab OT Goals Patient Stated Goal: pt unable OT Goal Formulation: With patient   OT Frequency:       Co-evaluation              AM-PAC OT "6 Clicks" Daily Activity     Outcome Measure Help from another person eating meals?: A Little Help from another person taking care of personal grooming?: A Little Help from another person toileting, which includes using toliet, bedpan, or urinal?: A Lot Help from another person bathing (including washing, rinsing, drying)?: A Lot Help from another person to put on and taking off regular upper body clothing?:  A Little Help from another person to put on and taking off regular lower body clothing?: A Lot 6 Click Score: 15   End of Session Equipment Utilized During Treatment: Rolling walker (2 wheels) Nurse Communication: Mobility status  Activity Tolerance: Patient tolerated treatment well;Patient limited by fatigue Patient left: in bed;with call bell/phone within reach;with bed alarm set  OT Visit Diagnosis: Unsteadiness on feet (R26.81);Muscle weakness (generalized) (M62.81)                Time: 6295-2841 OT Time Calculation (min): 24 min Charges:  OT General Charges $OT Visit: 1 Visit OT Evaluation $OT Eval Moderate Complexity: 1 Mod  Tyler Deis, OTR/L [facility] Acute Rehabilitation Office: [phone]   Myrla Halsted 10/20/2023, 2:41 PM

## 2023-10-20 NOTE — Progress Notes (Signed)
Unable to obtain sputum sample at this time.

## 2023-10-21 ENCOUNTER — Inpatient Hospital Stay (HOSPITAL_COMMUNITY): Payer: 59

## 2023-10-21 DIAGNOSIS — J9601 Acute respiratory failure with hypoxia: Secondary | ICD-10-CM | POA: Diagnosis not present

## 2023-10-21 LAB — RESPIRATORY PANEL BY PCR

## 2023-10-21 LAB — GLUCOSE, CAPILLARY
Glucose-Capillary: 137 mg/dL — ABNORMAL HIGH (ref 70–99)
Glucose-Capillary: 141 mg/dL — ABNORMAL HIGH (ref 70–99)
Glucose-Capillary: 175 mg/dL — ABNORMAL HIGH (ref 70–99)
Glucose-Capillary: 188 mg/dL — ABNORMAL HIGH (ref 70–99)
Glucose-Capillary: 189 mg/dL — ABNORMAL HIGH (ref 70–99)
Glucose-Capillary: 272 mg/dL — ABNORMAL HIGH (ref 70–99)

## 2023-10-21 LAB — BASIC METABOLIC PANEL
Anion gap: 10 (ref 5–15)
BUN: 13 mg/dL (ref 8–23)
CO2: 25 mmol/L (ref 22–32)
Calcium: 7.9 mg/dL — ABNORMAL LOW (ref 8.9–10.3)
Chloride: 105 mmol/L (ref 98–111)
Creatinine, Ser: 0.88 mg/dL (ref 0.44–1.00)
GFR, Estimated: >60 mL/min (ref >60)
Glucose, Bld: 167 mg/dL — ABNORMAL HIGH (ref 70–99)
Potassium: 4 mmol/L (ref 3.5–5.1)
Sodium: 140 mmol/L (ref 135–145)

## 2023-10-21 LAB — CBC WITH DIFFERENTIAL/PLATELET
Abs Immature Granulocytes: 0.24 10*3/uL — ABNORMAL HIGH (ref 0.00–0.07)
Basophils Absolute: 0 10*3/uL (ref 0.0–0.1)
Basophils Relative: 1 %
Eosinophils Absolute: 0 10*3/uL (ref 0.0–0.5)
Eosinophils Relative: 0 %
HCT: 36.6 % (ref 36.0–46.0)
Hemoglobin: 12.4 g/dL (ref 12.0–15.0)
Immature Granulocytes: 5 %
Lymphocytes Relative: 31 %
Lymphs Abs: 1.6 10*3/uL (ref 0.7–4.0)
MCH: 33.9 pg (ref 26.0–34.0)
MCHC: 33.9 g/dL (ref 30.0–36.0)
MCV: 100 fL (ref 80.0–100.0)
Monocytes Absolute: 0.5 10*3/uL (ref 0.1–1.0)
Monocytes Relative: 9 %
Neutro Abs: 2.9 10*3/uL (ref 1.7–7.7)
Neutrophils Relative %: 54 %
Platelets: 179 10*3/uL (ref 150–400)
RBC: 3.66 MIL/uL — ABNORMAL LOW (ref 3.87–5.11)
RDW: 16.5 % — ABNORMAL HIGH (ref 11.5–15.5)
WBC: 5.3 10*3/uL (ref 4.0–10.5)
nRBC: 0 % (ref 0.0–0.2)

## 2023-10-21 LAB — MRSA NEXT GEN BY PCR, NASAL: MRSA by PCR Next Gen: NOT DETECTED

## 2023-10-21 LAB — CULTURE, BLOOD (ROUTINE X 2): Special Requests: ADEQUATE

## 2023-10-21 MED ORDER — AMLODIPINE BESYLATE 5 MG PO TABS
10.0000 mg | ORAL_TABLET | Freq: Every day | ORAL | Status: DC
  Administered 2023-10-21 – 2023-10-22 (×2): 10 mg via ORAL
  Filled 2023-10-21: qty 4
  Filled 2023-10-21: qty 2

## 2023-10-21 MED ORDER — AQUAPHOR EX OINT
TOPICAL_OINTMENT | Freq: Every day | CUTANEOUS | Status: DC | PRN
  Filled 2023-10-21: qty 50

## 2023-10-21 MED ORDER — AMOXICILLIN-POT CLAVULANATE 875-125 MG PO TABS
1.0000 | ORAL_TABLET | Freq: Two times a day (BID) | ORAL | Status: DC
  Administered 2023-10-21 – 2023-10-22 (×2): 1 via ORAL
  Filled 2023-10-21 (×2): qty 1

## 2023-10-21 NOTE — Progress Notes (Signed)
SATURATION QUALIFICATIONS: (This note is used to comply with regulatory documentation for home oxygen)  Patient Saturations on Room Air at Rest = 87%  Patient Saturations on Room Air while Ambulating = patient baseline wheelchair bound   Patient Saturations on 2 Liters of oxygen while sitting  = 94%  Please briefly explain why patient needs home oxygen:O2 sat drops without O2   Susan [name], Kae Heller, RN

## 2023-10-21 NOTE — Progress Notes (Signed)
TRH ROUNDING  NOTE Susan [name] NGE:[id number]  DOB: 1961/01/21  DOA: 10/19/2023  PCP: Moshe Cipro, FNP  10/21/2023,2:40 PM  LOS: 2 days   Code Status: Full code from: Home current Dispo: Unclear   63 year old black female  CVA 2021 with persistent cognitive/language deficits and bedbound state and chronically wheelchair-bound--previous lengthy hospital stay 11/11/2019 01/09/2020 respiratory failure septic shock intubation tracheostomy and PEG placement decannulated 12/30/2019 DM TY 2 Severe schizophrenia Seizures Known HIV  HFrEF-HFmpEF last echo 11/17/2019 EF 55-60% normal valves Has had previous PRES syndrome causing encephalopathy and is supposed to be on Keppra  very difficult to communicate with given massive strokes in the past --daughter historian  2/15 presented from home irregular breathing given breathing treatment hypotensive in 80s, heart rate 100s requiring 4 L as was satting 88% on room air-- Tmax at home was 102 sodium 136 potassium 3.7 BUN/creatinine 15/1.3 lactic acid 3.1  WBC 7.0 hemoglobin 12.9 platelet 246---Influenza A positive on arrival  portable CXR no radiographic evidence of acute cardiopulmonary process catheter sclerosis  given 2 L of fluid started on Tamiflu antibiotics  In addition to influenza because of spiking fevers worked up and found to have probable aspiration pneumonia as well  Plan  Sepsis on admission secondary to acute influenza "A"  Empiric management with Tamiflu 75 twice daily-Mucinex 600 twice daily DuoNeb as prn Culture 1/4 staph epi presumed contaminant--see below discussion  Hypoxic respiratory failure Secondary to influenza sepsis  ? aspiration-febrile-spiking fever 101.5 CXR 1 view 2/17 suggestive pneumonia Mentation has improved able to take p.o.-will switch Unasyn to Augmentin until 2/22 (7d) and then stop--will stop steroids  need desat screen prior to discharge  AKI on admission Likely secondary to sepsis-holding diuretic  as below Monitor trends in the next 24 hours with labs, fluids as above  Massive CVA 2021 with lengthy hospital stay previously previous trach PEG and persisting cognitive and language deficits Chronically wheelchair-bound She does have seizure disorder secondary to this additionally At discharge resume statin-unclear why not on aspirin-outpatient follow-up  Htn Antihypertensives amlodipine 10 clonidine 0.2 twice daily Maxide 1 daily all held on admit Resuming amlodipine 10  DM TY 2 Lantus 15 units held, metformin 500 twice daily held CBGs ranging 1 30-1 80-if above 200 consistently will add coverage hold for now  Generalized anxiety, prior schizophrenia Takes sertraline 100 at bedtime and also takes Ativan for screaming spells at night-no issues overnight Would continue phenobarbital 64.8 twice daily, Depakote 500 3 times daily, Vimpat 150 twice daily, Keppra 1000 twice daily--- phenobarb therapeutic 823 valproate slightly low at 17  Dysphagia secondary to above Maintained on dysphagia 2 diet-watch for risk of aspiration SLP engaged to assess-has been getting therapy at home previously  HIV follows with Dr. Meredith Pel not appear to have been seen since May 2024 Continues Triumeq 1 daily, Tivicay 50 at bedtime CD4 count pending still Will need outpatient follow up coordination c ID-CC them   DVT prophylaxis: Lovenox  Status is: Inpatient Remains inpatient appropriate because: Requires further hospital stay   Subjective:   Much more awake coherent pleasant no distress looks comfortable no chest pain more verbal today Was able to eat some-just returned from dialysis  Objective + exam Vitals:   10/21/23 0830 10/21/23 0853 10/21/23 1127 10/21/23 1411  BP: (!) 132/58  (!) 136/59   Pulse: 78  80   Resp:   18   Temp: 99.6 F (37.6 C)  99.7 F (37.6 C)   TempSrc: Oral  Oral  SpO2: 94% 95% 96% 98%  Weight:      Height:       Filed Weights   10/19/23 0618  Weight:  127.9 kg    Examination:  Awake coherent pleasant no distress more verbal seems more lively-no icterus no pallor Thick neck-prior tracheotomy scar S1-S2 murmur noted trace edema Coarse breath sounds bilaterally  Abdomen soft no rebound no guarding    Data Reviewed: reviewed   CBC    Component Value Date/Time   WBC 5.3 10/21/2023 0619   RBC 3.66 (L) 10/21/2023 0619   HGB 12.4 10/21/2023 0619   HCT 36.6 10/21/2023 0619   PLT 179 10/21/2023 0619   MCV 100.0 10/21/2023 0619   MCH 33.9 10/21/2023 0619   MCHC 33.9 10/21/2023 0619   RDW 16.5 (H) 10/21/2023 0619   LYMPHSABS 1.6 10/21/2023 0619   MONOABS 0.5 10/21/2023 0619   EOSABS 0.0 10/21/2023 0619   BASOSABS 0.0 10/21/2023 0619      Latest Ref Rng & Units 10/21/2023    6:19 AM 10/20/2023    7:50 AM 10/20/2023   12:52 AM  CMP  Glucose 70 - 99 mg/dL 811  914  782   BUN 8 - 23 mg/dL 13  12  12    Creatinine 0.44 - 1.00 mg/dL 9.56  2.13  0.86   Sodium 135 - 145 mmol/L 140  138  138   Potassium 3.5 - 5.1 mmol/L 4.0  4.0  4.4   Chloride 98 - 111 mmol/L 105  101  99   CO2 22 - 32 mmol/L 25  24  23    Calcium 8.9 - 10.3 mg/dL 7.9  8.1  8.9     Scheduled Meds:  abacavir-dolutegravir-lamiVUDine  1 tablet Oral q AM   amLODipine  10 mg Oral Daily   amoxicillin-clavulanate  1 tablet Oral Q12H   divalproex  500 mg Oral TID   dolutegravir  50 mg Oral QHS   enoxaparin (LOVENOX) injection  40 mg Subcutaneous Q24H   guaiFENesin  600 mg Oral BID   insulin aspart  0-9 Units Subcutaneous TID WC   ipratropium-albuterol  3 mL Nebulization Q6H   lacosamide  150 mg Oral BID   lactulose  20 g Oral TID PC   levETIRAcetam  1,000 mg Oral BID   methylPREDNISolone (SOLU-MEDROL) injection  40 mg Intravenous Q12H   nicotine  14 mg Transdermal Daily   oseltamivir  75 mg Oral BID   pantoprazole  40 mg Oral Daily   PHENobarbital  64.8 mg Oral BID   rosuvastatin  20 mg Oral QHS   sertraline  100 mg Oral QHS   Continuous Infusions:   ampicillin-sulbactam (UNASYN) IV 3 g (10/21/23 1308)    Time  35  Rhetta Mura, MD  [facility]

## 2023-10-21 NOTE — Plan of Care (Signed)
  Problem: SLP Dysphagia Goals Goal: Misc Dysphagia Goal Flowsheets (Taken 10/21/2023 1227) Misc Dysphagia Goal: Pt will tolerate a mechanical soft diet and thin liquids with no overt or subtle s/s of aspiration.

## 2023-10-21 NOTE — TOC Initial Note (Signed)
Transition of Care [facility]) - Initial/Assessment Note    Patient Details  Name: Susan [name] MRN: [id number] Date of Birth: Sep 20, 1960  Transition of Care [facility]) CM/SW Contact:    Kermit Balo, RN Phone Number: 10/21/2023, 1:46 PM  Clinical Narrative:                  CM met with the patient and then called and spoke to her daughter (with pt permission). Pt lives with her daughter and SIL.  Daughter is with the patient most of the time.  Daughter provides needed transportation and manages her medications. Daughter states their car went into the shop today so pt will need arranged transportation at d/c.  TOC following.  Expected Discharge Plan: Home/Self Care Barriers to Discharge: Continued Medical Work up   Patient Goals and CMS Choice            Expected Discharge Plan and Services   Discharge Planning Services: CM Consult   Living arrangements for the past 2 months: Single Family Home                                      Prior Living Arrangements/Services Living arrangements for the past 2 months: Single Family Home Lives with:: Adult Children Patient language and need for interpreter reviewed:: Yes Do you feel safe going back to the place where you live?: Yes        Care giver support system in place?: Yes (comment) Current home services: DME (Wheelchair/ rollator/ shower seat) Criminal Activity/Legal Involvement Pertinent to Current Situation/Hospitalization: No - Comment as needed  Activities of Daily Living      Permission Sought/Granted                  Emotional Assessment Appearance:: Appears stated age Attitude/Demeanor/Rapport: Engaged   Orientation: : Oriented to Self, Oriented to Place   Psych Involvement: No (comment)  Admission diagnosis:  Acute hypoxemic respiratory failure (HCC) [J96.01] Sepsis, due to unspecified organism, unspecified whether acute organ dysfunction present [facility]) [A41.9] Patient Active Problem List    Diagnosis Date Noted   Acute hypoxemic respiratory failure (HCC) 10/19/2023   Abrasion, nose w/o infection 07/24/2022   Screening examination for STD (sexually transmitted disease) 07/04/2021   PRES (posterior reversible encephalopathy syndrome) 07/31/2020   Palliative care encounter    Metabolic encephalopathy 07/22/2020   Altered mental status 07/20/2020   Encephalopathy 04/02/2020   Muscle spasm 04/01/2020   Palliative care by specialist    Adult failure to thrive    Hx of completed stroke 11/24/2019   Acute kidney injury (AKI) with acute tubular necrosis (ATN) (HCC)    Aortic valve endocarditis 11/14/2019   Recurrent falls 10/15/2019   Constipation 02/26/2019   Schizophrenia (HCC) 02/18/2019   AKI (acute kidney injury) (HCC) 02/17/2019   Chronic respiratory failure (HCC) 02/16/2019   Right hip pain 02/21/2018   Medication monitoring encounter 11/27/2017   DNR (do not resuscitate) discussion 08/23/2017   Weakness generalized 06/24/2017   Osteoarthritis 07/14/2016   Abnormal hemoglobin (HCC) 04/18/2016   Hot flashes 10/16/2015   Human immunodeficiency virus (HIV) disease (HCC) 10/05/2015   PTSD (post-traumatic stress disorder) 05/12/2015   MDD (major depressive disorder), recurrent episode, moderate (HCC) 05/11/2015   Cannabis use disorder, severe, dependence (HCC) 05/11/2015   Tobacco use disorder 05/11/2015   Uncontrolled type 2 diabetes mellitus with hyperglycemia, with long-term current use of insulin (  HCC) 04/18/2015   Diabetes mellitus type 2 without retinopathy (HCC) 01/28/2015   Hereditary and idiopathic peripheral neuropathy 06/03/2013   Dysfunctional uterine bleeding 06/04/2012   Dyslipidemia    GERD (gastroesophageal reflux disease)    Schizoaffective disorder, bipolar type (HCC) 04/26/2011   Morbid obesity (HCC) 04/26/2011   HIV disease (HCC) 03/28/2011   Essential hypertension 03/28/2011   PCP:  Moshe Cipro, FNP Pharmacy:   [facility]  #[id number] - [place], [place] - [address] AT [facility] OF [address] [place] [address] Phone: [phone] Fax: [phone]  [id number] - [place], [place] - [address] AT [facility] OF [address] [facility] [address] & [address] [place] [address] Phone: [phone] Fax: [phone]     Social Drivers of Health (SDOH) Social History: SDOH Screenings   Food Insecurity: No Food Insecurity (09/19/2023)  Housing: Low Risk  (09/19/2023)  Transportation Needs: No Transportation Needs (09/19/2023)  Utilities: Not At Risk (09/19/2023)  Alcohol Screen: Low Risk  (07/11/2017)  Depression (PHQ2-9): Low Risk  (01/25/2023)  Tobacco Use: High Risk (10/19/2023)   SDOH Interventions:     Readmission Risk Interventions     No data to display

## 2023-10-21 NOTE — Plan of Care (Signed)
   Problem: Education: Goal: Ability to describe self-care measures that may prevent or decrease complications (Diabetes Survival Skills Education) will improve Outcome: Progressing

## 2023-10-21 NOTE — Plan of Care (Signed)
Pt alert to self, pleasantly confused. Mx BM overnight, peri care done. Pt turned every 2h, BL heels offloaded with prevalon boots. Will continue to monitor pt.  Problem: Metabolic: Goal: Ability to maintain appropriate glucose levels will improve Outcome: Progressing   Problem: Clinical Measurements: Goal: Ability to maintain clinical measurements within normal limits will improve Outcome: Progressing   Problem: Activity: Goal: Risk for activity intolerance will decrease Outcome: Progressing   Problem: Elimination: Goal: Will not experience complications related to bowel motility Outcome: Progressing   Problem: Safety: Goal: Ability to remain free from injury will improve Outcome: Progressing   Problem: Skin Integrity: Goal: Risk for impaired skin integrity will decrease Outcome: Progressing

## 2023-10-21 NOTE — Progress Notes (Signed)
Speech Language Pathology Treatment: Dysphagia  Patient Details Name: Susan [name] MRN: [id number] DOB: Jan 03, 1961 Today's Date: 10/21/2023 Time: 4098-1191 SLP Time Calculation (min) (ACUTE ONLY): 9 min  Assessment / Plan / Recommendation Clinical Impression  Pt tolerated trials of regular solids and thin liquids with no overt or subtle s/s of aspiration observed. Mastication mildly prolonged likely in the setting of absent dentition. Pt able to clear bolus with additional time. Pharyngeal swallow initiation appeared prompt with laryngeal elevation noted.   Diet advanced to mechanical soft with continuation of thin liquids as tolerated. This is likely pt's least restrictive diet at this time.   SLP will follow up to assess diet tolerance and modify diet if indicated.    HPI HPI: Pt is a 63 y.o. female presenting to the ED with fever/lethargy and shortness of breath.. Additionally, she presents with tachycardia, cough and cold-like symptoms. CXR (10/19/23) was without evidence of cardiopulcomary disease. In the ED-pt was found to have influenza A infection. Previous SLP eval on 08/05/20 noted "No s/sx of aspiration were noted with solids or liquids. Mastication was prolonged with more fibrous regular textures such as pineapple but functional for crunchy regular textures such as graham crackers". Dys 3 diet/thin liquids recommended at that time. PMH: Schizophrenia, Seizures, HIV, DM-2, CVA in 2021 with chronic cognitive/language deficits.      SLP Plan  Continue with current plan of care      Recommendations for follow up therapy are one component of a multi-disciplinary discharge planning process, led by the attending physician.  Recommendations may be updated based on patient status, additional functional criteria and insurance authorization.    Recommendations  Diet recommendations: Dysphagia 3 (mechanical soft);Thin liquid Liquids provided via: Cup;Straw Medication Administration: Whole  meds with puree Supervision: Staff to assist with self feeding Compensations: Minimize environmental distractions;Slow rate;Small sips/bites Postural Changes and/or Swallow Maneuvers: Seated upright 90 degrees                  Oral care BID;Staff/trained caregiver to provide oral care    (Defer to PT/OT recommendations) Dysphagia, unspecified (R13.10)     Continue with current plan of care     Susan [name]  10/21/2023, 12:32 PM

## 2023-10-22 ENCOUNTER — Other Ambulatory Visit (HOSPITAL_COMMUNITY): Payer: Self-pay

## 2023-10-22 ENCOUNTER — Telehealth: Payer: Self-pay

## 2023-10-22 DIAGNOSIS — J9601 Acute respiratory failure with hypoxia: Secondary | ICD-10-CM | POA: Diagnosis not present

## 2023-10-22 LAB — GLUCOSE, CAPILLARY
Glucose-Capillary: 159 mg/dL — ABNORMAL HIGH (ref 70–99)
Glucose-Capillary: 145 mg/dL — ABNORMAL HIGH (ref 70–99)

## 2023-10-22 LAB — T-HELPER CELLS (CD4) COUNT (NOT AT ARMC)
CD4 % Helper T Cell: 32 % — ABNORMAL LOW (ref 33–65)
CD4 T Cell Abs: 418 /uL (ref 400–1790)

## 2023-10-22 LAB — BASIC METABOLIC PANEL
Anion gap: 12 (ref 5–15)
BUN: 9 mg/dL (ref 8–23)
CO2: 23 mmol/L (ref 22–32)
Calcium: 8 mg/dL — ABNORMAL LOW (ref 8.9–10.3)
Chloride: 104 mmol/L (ref 98–111)
Creatinine, Ser: 0.8 mg/dL (ref 0.44–1.00)
GFR, Estimated: >60 mL/min (ref >60)
Glucose, Bld: 140 mg/dL — ABNORMAL HIGH (ref 70–99)
Potassium: 4.1 mmol/L (ref 3.5–5.1)
Sodium: 139 mmol/L (ref 135–145)

## 2023-10-22 LAB — CBC WITH DIFFERENTIAL/PLATELET
Abs Immature Granulocytes: 0.07 10*3/uL (ref 0.00–0.07)
Basophils Absolute: 0 10*3/uL (ref 0.0–0.1)
Basophils Relative: 0 %
Eosinophils Absolute: 0 10*3/uL (ref 0.0–0.5)
Eosinophils Relative: 0 %
HCT: 37.3 % (ref 36.0–46.0)
Hemoglobin: 12.4 g/dL (ref 12.0–15.0)
Immature Granulocytes: 1 %
Lymphocytes Relative: 49 %
Lymphs Abs: 2.6 10*3/uL (ref 0.7–4.0)
MCH: 33.5 pg (ref 26.0–34.0)
MCHC: 33.2 g/dL (ref 30.0–36.0)
MCV: 100.8 fL — ABNORMAL HIGH (ref 80.0–100.0)
Monocytes Absolute: 0.6 10*3/uL (ref 0.1–1.0)
Monocytes Relative: 11 %
Neutro Abs: 2 10*3/uL (ref 1.7–7.7)
Neutrophils Relative %: 39 %
Platelets: 170 10*3/uL (ref 150–400)
RBC: 3.7 MIL/uL — ABNORMAL LOW (ref 3.87–5.11)
RDW: 16.6 % — ABNORMAL HIGH (ref 11.5–15.5)
WBC: 5.2 10*3/uL (ref 4.0–10.5)
nRBC: 0 % (ref 0.0–0.2)

## 2023-10-22 MED ORDER — NICOTINE 14 MG/24HR TD PT24
14.0000 mg | MEDICATED_PATCH | Freq: Every day | TRANSDERMAL | 0 refills | Status: DC
  Filled 2023-10-22: qty 28, 28d supply, fill #0

## 2023-10-22 MED ORDER — IPRATROPIUM-ALBUTEROL 0.5-2.5 (3) MG/3ML IN SOLN: 3.0000 mL | Freq: Two times a day (BID) | RESPIRATORY_TRACT | Status: DC

## 2023-10-22 MED ORDER — GUAIFENESIN ER 600 MG PO TB12: 600.0000 mg | ORAL_TABLET | Freq: Two times a day (BID) | ORAL | 0 refills | Status: DC

## 2023-10-22 MED ORDER — AMOXICILLIN-POT CLAVULANATE 875-125 MG PO TABS
1.0000 | ORAL_TABLET | Freq: Two times a day (BID) | ORAL | 0 refills | Status: AC
  Filled 2023-10-22: qty 6, 3d supply, fill #0

## 2023-10-22 MED ORDER — AMOXICILLIN-POT CLAVULANATE 875-125 MG PO TABS: 1.0000 | ORAL_TABLET | Freq: Two times a day (BID) | ORAL | 0 refills | Status: DC

## 2023-10-22 MED ORDER — GUAIFENESIN ER 600 MG PO TB12
600.0000 mg | ORAL_TABLET | Freq: Two times a day (BID) | ORAL | 0 refills | Status: AC
Start: 2023-10-22 — End: 2023-10-27
  Filled 2023-10-22: qty 10, 5d supply, fill #0

## 2023-10-22 MED ORDER — NICOTINE 14 MG/24HR TD PT24: 14.0000 mg | MEDICATED_PATCH | Freq: Every day | TRANSDERMAL | 0 refills | Status: DC

## 2023-10-22 MED ORDER — OSELTAMIVIR PHOSPHATE 75 MG PO CAPS: 75.0000 mg | ORAL_CAPSULE | Freq: Two times a day (BID) | ORAL | 0 refills | Status: DC

## 2023-10-22 MED ORDER — OSELTAMIVIR PHOSPHATE 75 MG PO CAPS
75.0000 mg | ORAL_CAPSULE | Freq: Two times a day (BID) | ORAL | 0 refills | Status: AC
  Filled 2023-10-22: qty 4, 2d supply, fill #0

## 2023-10-22 NOTE — Plan of Care (Signed)
   Problem: Coping: Goal: Ability to adjust to condition or change in health will improve Outcome: Progressing   Problem: Fluid Volume: Goal: Ability to maintain a balanced intake and output will improve Outcome: Progressing   Problem: Nutritional: Goal: Progress toward achieving an optimal weight will improve Outcome: Progressing   Problem: Skin Integrity: Goal: Risk for impaired skin integrity will decrease Outcome: Progressing

## 2023-10-22 NOTE — Telephone Encounter (Signed)
Copied from CRM [id number]. Topic: Clinical - Medication Question >> Oct 22, 2023  2:30 PM Sim Boast F wrote: Reason for CRM: Patient daughter called regarding nicotine patch that was prescribed while patient was admitted at [facility], would like to know if it's possible for patient to have more? I see the nicotine patches on patients med list with a upcomming date for tomorrow 2/19 - not sure what this means

## 2023-10-22 NOTE — Discharge Summary (Signed)
Physician Discharge Summary  LOGEN FOWLE ZOX:[id number] DOB: Oct 17, 1960 DOA: 10/19/2023  PCP: Moshe Cipro, FNP  Admit date: 10/19/2023 Discharge date: 10/22/2023  Admitted From: Home  Discharge disposition: Home   Recommendations for Outpatient Follow-Up:   Follow up with your primary care provider in one week.  Check CBC, BMP, magnesium in the next visit Would benefit from ID follow-up as outpatient for history of HIV.  Discharge Diagnosis:   Principal Problem:   Acute hypoxemic respiratory failure (HCC) Active Problems:   HIV disease (HCC)   Essential hypertension   Schizoaffective disorder, bipolar type (HCC)   Diabetes mellitus type 2 without retinopathy (HCC) Influenza A   Discharge Condition: Improved.  Diet recommendation: mechanical soft diet.  Wound care: None.  Code status: Full.   History of Present Illness:   63 year old female with history of CVA with persistent cognitive language deficits, bedbound status, history of tracheostomy and PEG tube placement in the past, diabetes mellitus type 2, schizophrenia, seizure disorder, HIV and HFrEF-HFmpEF presented to hospital on 10/19/2023 with difficulty breathing with low blood pressure and hypoxia saturating 88% on room air.  She was febrile with temperature 102 F at home.  Initial labs showed a lactic acid elevated at 3.0.  No leukocytosis.  Patient tested positive for influenza A.  Chest x-ray without any evidence of infiltrate.  Patient was started on Tamiflu antibiotics and was admitted hospital for further evaluation and treatment.  Hospital Course:   Following conditions were addressed during hospitalization as listed below,  Sepsis on admission secondary to acute influenza "A"  Continue Tamiflu complete 5-day course.  Continue Mucinex, bronchodilators.  Blood Culture 1/4 staph epi presumed contaminant.   Hypoxic respiratory failure secondary to influenza. Follow-up with 101.5 CXR 1 view 2/17  suggestive pneumonia.  Was on Unasyn.  Has been changed to Augmentin on discharge.  Patient did not require oxygen prior to discharge.   Massive CVA 2021.  Status post PEG tube with persistent cognitive language deficits chronically on wheelchair bound.  Continue statins on discharge.  Essential hypertension. Continues on amlodipine 10 milligram clonidine 0.2 twice daily Maxide at home   Diabetes mellitus type 2. Lantus and metformin at home.  Will resume on discharge.  Generalized anxiety,  schizophrenia Continue sertraline, phenobarbital Depakote Vimpat Keppra. phenobarb therapeutic 823 valproate slightly low at 17   Dysphagia  History of stroke.  Speech therapy on board.  On dysphagia 3 diet.  Recommend soft diet on discharge.  HIV follows with Dr. Meredith Pel not appear to have been seen since May 2024. Continues Triumeq 1 daily, Tivicay 50 at bedtime.  Absolute CD4 count of 418.  Class III obesity.Body mass index is 51.58 kg/m.  Patient would benefit from weight loss as outpatient.  Disposition.  At this time, patient is stable for disposition home with outpatient PCP and ID follow-up.  Spoke with the patient's daughter Ms. Oswaldo Done on the phone prior to discharge.  Medical Consultants:   None.  Procedures:    None Subjective:   Today, patient was seen and examined at bedside.  Feels better.  Denies any pain, nausea, vomiting, fever, chills or rigor.  Wants to go home.  Did not qualify for oxygen.  Discharge Exam:   Vitals:   10/22/23 1142 10/22/23 1144  BP:    Pulse: 77 75  Resp:    Temp:    SpO2: 100% 100%   Vitals:   10/22/23 0739 10/22/23 1043 10/22/23 1142 10/22/23 1144  BP: 137/63 (!) 110/59  Pulse: 74 80 77 75  Resp: 18 19    Temp: 99.5 F (37.5 C) 97.6 F (36.4 C)    TempSrc: Oral Oral    SpO2: 98% 97% 100% 100%  Weight:      Height:       Body mass index is 51.58 kg/m.   General: Alert awake, not in obvious distress, obese built  Communicative HENT: pupils equally reacting to light,  No scleral pallor or icterus noted. Oral mucosa is moist.  Tracheostomy scar. Chest:  Diminished breath sounds bilaterally.  CVS: S1 &S2 heard. Regular rate and rhythm. Abdomen: Soft, nontender, nondistended.  Bowel sounds are heard.   Extremities: No cyanosis, clubbing trace edema.  Peripheral pulses are palpable. Psych: Alert, awake Communicative. CNS:  No cranial nerve deficits.  Power equal in all extremities.   Skin: Warm and dry.  No rashes noted.  The results of significant diagnostics from this hospitalization (including imaging, microbiology, ancillary and laboratory) are listed below for reference.     Diagnostic Studies:   DG Chest Port 1 View Result Date: 10/19/2023 CLINICAL DATA:  63 year old female with history of sepsis. EXAM: PORTABLE CHEST 1 VIEW COMPARISON:  Chest x-ray 04/01/2021. FINDINGS: Lung volumes are normal. No consolidative airspace disease. No pleural effusions. No pneumothorax. No pulmonary nodule or mass noted. Pulmonary vasculature and the cardiomediastinal silhouette are within normal limits. Atherosclerosis in the thoracic aorta. IMPRESSION: 1.  No radiographic evidence of acute cardiopulmonary disease. 2. Aortic atherosclerosis. Electronically Signed   By: Trudie Reed M.D.   On: 10/19/2023 07:25     Labs:   Basic Metabolic Panel: Recent Labs  Lab 10/19/23 0625 10/19/23 1138 10/20/23 0052 10/20/23 0750 10/21/23 0619 10/22/23 0845  NA 136  --  138 138 140 139  K 3.7  --  4.4 4.0 4.0 4.1  CL 99  --  99 101 105 104  CO2 23  --  23 24 25 23   GLUCOSE 156*  --  119* 112* 167* 140*  BUN 15  --  12 12 13 9   CREATININE 1.35* 1.17* 0.94 0.86 0.88 0.80  CALCIUM 8.4*  --  8.9 8.1* 7.9* 8.0*  MG  --   --  1.9  --   --   --    GFR Estimated Creatinine Clearance: 94.7 mL/min (by C-G formula based on SCr of 0.8 mg/dL). Liver Function Tests: Recent Labs  Lab 10/19/23 0625  AST 27  ALT 16   ALKPHOS 91  BILITOT 0.2  PROT 6.1*  ALBUMIN 2.9*   No results for input(s): "LIPASE", "AMYLASE" in the last 168 hours. No results for input(s): "AMMONIA" in the last 168 hours. Coagulation profile Recent Labs  Lab 10/19/23 0625  INR 1.1    CBC: Recent Labs  Lab 10/19/23 0625 10/19/23 1138 10/20/23 0750 10/21/23 0619 10/22/23 0957  WBC 7.0 6.6 5.4 5.3 5.2  NEUTROABS 4.7  --   --  2.9 2.0  HGB 12.9 12.3 12.6 12.4 12.4  HCT 38.0 36.5 37.3 36.6 37.3  MCV 100.5* 99.5 98.7 100.0 100.8*  PLT 246 245 PLATELET CLUMPS NOTED ON SMEAR, UNABLE TO ESTIMATE 179 170   Cardiac Enzymes: No results for input(s): "CKTOTAL", "CKMB", "CKMBINDEX", "TROPONINI" in the last 168 hours. BNP: Invalid input(s): "POCBNP" CBG: Recent Labs  Lab 10/21/23 2026 10/21/23 2106 10/21/23 2353 10/22/23 0616 10/22/23 1044  GLUCAP 188* 175* 141* 159* 145*   D-Dimer No results for input(s): "DDIMER" in the last 72 hours. Hgb A1c No results  for input(s): "HGBA1C" in the last 72 hours. Lipid Profile No results for input(s): "CHOL", "HDL", "LDLCALC", "TRIG", "CHOLHDL", "LDLDIRECT" in the last 72 hours. Thyroid function studies No results for input(s): "TSH", "T4TOTAL", "T3FREE", "THYROIDAB" in the last 72 hours.  Invalid input(s): "FREET3" Anemia work up No results for input(s): "VITAMINB12", "FOLATE", "FERRITIN", "TIBC", "IRON", "RETICCTPCT" in the last 72 hours. Microbiology Recent Results (from the past 240 hours)  Blood Culture (routine x 2)     Status: Abnormal   Collection Time: 10/19/23  6:17 AM   Specimen: BLOOD  Result Value Ref Range Status   Specimen Description BLOOD RIGHT ANTECUBITAL  Final   Special Requests   Final    BOTTLES DRAWN AEROBIC AND ANAEROBIC Blood Culture adequate volume   Culture  Setup Time   Final    GRAM POSITIVE COCCI IN CLUSTERS AEROBIC BOTTLE ONLY CRITICAL RESULT CALLED TO, READ BACK BY AND VERIFIED WITH: PHARMD JENNY ZHOU [id number] 0800 BY J RAZZAK, MT     Culture (A)  Final    STAPHYLOCOCCUS EPIDERMIDIS THE SIGNIFICANCE OF ISOLATING THIS ORGANISM FROM A SINGLE SET OF BLOOD CULTURES WHEN MULTIPLE SETS ARE DRAWN IS UNCERTAIN. PLEASE NOTIFY THE MICROBIOLOGY DEPARTMENT WITHIN ONE WEEK IF SPECIATION AND SENSITIVITIES ARE REQUIRED. Performed at [facility], [address], [place], [place] [address]    Report Status 10/21/2023 FINAL  Final  Blood Culture ID Panel (Reflexed)     Status: Abnormal   Collection Time: 10/19/23  6:17 AM  Result Value Ref Range Status   Enterococcus faecalis NOT DETECTED NOT DETECTED Final   Enterococcus Faecium NOT DETECTED NOT DETECTED Final   Listeria monocytogenes NOT DETECTED NOT DETECTED Final   Staphylococcus species DETECTED (A) NOT DETECTED Final    Comment: CRITICAL RESULT CALLED TO, READ BACK BY AND VERIFIED WITH: PHARMD JENNY ZHOU [id number] 0800 BY J RAZZAK, MT    Staphylococcus aureus (BCID) NOT DETECTED NOT DETECTED Final   Staphylococcus epidermidis DETECTED (A) NOT DETECTED Final    Comment: CRITICAL RESULT CALLED TO, READ BACK BY AND VERIFIED WITH: PHARMD JENNY ZHOU [id number] 0800 BY J RAZZAK, MT    Staphylococcus lugdunensis NOT DETECTED NOT DETECTED Final   Streptococcus species NOT DETECTED NOT DETECTED Final   Streptococcus agalactiae NOT DETECTED NOT DETECTED Final   Streptococcus pneumoniae NOT DETECTED NOT DETECTED Final   Streptococcus pyogenes NOT DETECTED NOT DETECTED Final   A.calcoaceticus-baumannii NOT DETECTED NOT DETECTED Final   Bacteroides fragilis NOT DETECTED NOT DETECTED Final   Enterobacterales NOT DETECTED NOT DETECTED Final   Enterobacter cloacae complex NOT DETECTED NOT DETECTED Final   Escherichia coli NOT DETECTED NOT DETECTED Final   Klebsiella aerogenes NOT DETECTED NOT DETECTED Final   Klebsiella oxytoca NOT DETECTED NOT DETECTED Final   Klebsiella pneumoniae NOT DETECTED NOT DETECTED Final   Proteus species NOT DETECTED NOT DETECTED Final   Salmonella species  NOT DETECTED NOT DETECTED Final   Serratia marcescens NOT DETECTED NOT DETECTED Final   Haemophilus influenzae NOT DETECTED NOT DETECTED Final   Neisseria meningitidis NOT DETECTED NOT DETECTED Final   Pseudomonas aeruginosa NOT DETECTED NOT DETECTED Final   Stenotrophomonas maltophilia NOT DETECTED NOT DETECTED Final   Candida albicans NOT DETECTED NOT DETECTED Final   Candida auris NOT DETECTED NOT DETECTED Final   Candida glabrata NOT DETECTED NOT DETECTED Final   Candida krusei NOT DETECTED NOT DETECTED Final   Candida parapsilosis NOT DETECTED NOT DETECTED Final   Candida tropicalis NOT DETECTED NOT DETECTED Final  Cryptococcus neoformans/gattii NOT DETECTED NOT DETECTED Final   Methicillin resistance mecA/C NOT DETECTED NOT DETECTED Final    Comment: Performed at [facility], [address], [place], [place] [address]  Resp panel by RT-PCR (RSV, Flu A&B, Covid) Anterior Nasal Swab     Status: Abnormal   Collection Time: 10/19/23  6:25 AM   Specimen: Anterior Nasal Swab  Result Value Ref Range Status   SARS Coronavirus 2 by RT PCR NEGATIVE NEGATIVE Final   Influenza A by PCR POSITIVE (A) NEGATIVE Final   Influenza B by PCR NEGATIVE NEGATIVE Final    Comment: (NOTE) The Xpert Xpress SARS-CoV-2/FLU/RSV plus assay is intended as an aid in the diagnosis of influenza from Nasopharyngeal swab specimens and should not be used as a sole basis for treatment. Nasal washings and aspirates are unacceptable for Xpert Xpress SARS-CoV-2/FLU/RSV testing.  Fact Sheet for Patients: BloggerCourse.com  Fact Sheet for Healthcare Providers: SeriousBroker.it  This test is not yet approved or cleared by the [place] FDA and has been authorized for detection and/or diagnosis of SARS-CoV-2 by FDA under an Emergency Use Authorization (EUA). This EUA will remain in effect (meaning this test can be used) for the duration of the COVID-19  declaration under Section 564(b)(1) of the Act, 21 U.S.C. section 360bbb-3(b)(1), unless the authorization is terminated or revoked.     Resp Syncytial Virus by PCR NEGATIVE NEGATIVE Final    Comment: (NOTE) Fact Sheet for Patients: BloggerCourse.com  Fact Sheet for Healthcare Providers: SeriousBroker.it  This test is not yet approved or cleared by the [place] FDA and has been authorized for detection and/or diagnosis of SARS-CoV-2 by FDA under an Emergency Use Authorization (EUA). This EUA will remain in effect (meaning this test can be used) for the duration of the COVID-19 declaration under Section 564(b)(1) of the Act, 21 U.S.C. section 360bbb-3(b)(1), unless the authorization is terminated or revoked.  Performed at [facility], [address] [place], [place] [address]   Respiratory (~20 pathogens) panel by PCR     Status: None   Collection Time: 10/19/23  2:58 PM   Specimen: Nasopharyngeal Swab; Respiratory  Result Value Ref Range Status   Adenovirus NOT DETECTED NOT DETECTED Final   Coronavirus 229E NOT DETECTED NOT DETECTED Final    Comment: (NOTE) The Coronavirus on the Respiratory Panel, DOES NOT test for the novel  Coronavirus (2019 nCoV)    Coronavirus HKU1 NOT DETECTED NOT DETECTED Final   Coronavirus NL63 NOT DETECTED NOT DETECTED Final   Coronavirus OC43 NOT DETECTED NOT DETECTED Final   Metapneumovirus NOT DETECTED NOT DETECTED Final   Rhinovirus / Enterovirus NOT DETECTED NOT DETECTED Final   Influenza A NOT DETECTED NOT DETECTED Final   Influenza B NOT DETECTED NOT DETECTED Final   Parainfluenza Virus 1 NOT DETECTED NOT DETECTED Final   Parainfluenza Virus 2 NOT DETECTED NOT DETECTED Final   Parainfluenza Virus 3 NOT DETECTED NOT DETECTED Final   Parainfluenza Virus 4 NOT DETECTED NOT DETECTED Final   Respiratory Syncytial Virus NOT DETECTED NOT DETECTED Final   Bordetella pertussis NOT  DETECTED NOT DETECTED Final   Bordetella Parapertussis NOT DETECTED NOT DETECTED Final   Chlamydophila pneumoniae NOT DETECTED NOT DETECTED Final   Mycoplasma pneumoniae NOT DETECTED NOT DETECTED Final    Comment: Performed at [facility], [address], [place], [place] [address]  Blood Culture (routine x 2)     Status: None (Preliminary result)   Collection Time:  10/19/23  6:46 PM   Specimen: BLOOD  Result Value Ref Range Status   Specimen Description BLOOD BLOOD LEFT HAND  Final   Special Requests   Final    BOTTLES DRAWN AEROBIC AND ANAEROBIC Blood Culture results may not be optimal due to an inadequate volume of blood received in culture bottles   Culture   Final    NO GROWTH 3 DAYS Performed at [facility], [address], [place], [place] [address]    Report Status PENDING  Incomplete  Culture, blood (single) w Reflex to ID Panel     Status: None (Preliminary result)   Collection Time: 10/20/23 11:32 AM   Specimen: BLOOD LEFT HAND  Result Value Ref Range Status   Specimen Description BLOOD LEFT HAND  Final   Special Requests   Final    BOTTLES DRAWN AEROBIC AND ANAEROBIC Blood Culture results may not be optimal due to an inadequate volume of blood received in culture bottles   Culture   Final    NO GROWTH 2 DAYS Performed at [facility], [address], [place], [place] [address]    Report Status PENDING  Incomplete  MRSA Next Gen by PCR, Nasal     Status: None   Collection Time: 10/21/23  1:10 PM   Specimen: Nasal Mucosa; Nasal Swab  Result Value Ref Range Status   MRSA by PCR Next Gen NOT DETECTED NOT DETECTED Final    Comment: (NOTE) The GeneXpert MRSA Assay (FDA approved for NASAL specimens only), is one component of a comprehensive MRSA colonization surveillance program. It is not intended to diagnose MRSA infection nor to guide or monitor treatment for MRSA infections. Test performance is not FDA approved in patients less than 41  years old. Performed at [facility], [address] [place], [place] [address]      Discharge Instructions:   Discharge Instructions     Call MD for:  difficulty breathing, headache or visual disturbances   Complete by: As directed    Call MD for:  persistant nausea and vomiting   Complete by: As directed    Call MD for:  temperature >100.4   Complete by: As directed    Discharge instructions   Complete by: As directed    Follow-up with your primary care provider in 1 week.  Check blood work at that time.  Seek medical attention for worsening symptoms.   Increase activity slowly   Complete by: As directed    No wound care   Complete by: As directed       Allergies as of 10/22/2023       Reactions   Lyrica [pregabalin] Swelling   Has LE swelling        Medication List     TAKE these medications    abacavir-dolutegravir-lamiVUDine 600-50-300 MG tablet Commonly known as: TRIUMEQ Take 1 tablet by mouth in the morning.   acetaminophen 325 MG tablet Commonly known as: TYLENOL Take 650 mg by mouth at bedtime as needed for fever or moderate pain (pain score 4-6).   albuterol 108 (90 Base) MCG/ACT inhaler Commonly known as: VENTOLIN HFA Inhale 2 puffs into the lungs every 6 (six) hours as needed for wheezing or shortness of breath. INHALE 2 PUFFS INTO LUNGS EVERY SIX HOURS AS NEEDED FOR WHEEZING OR SHORTNESS OF BREATH   amLODipine 10 MG tablet Commonly known as: NORVASC Take 1 tablet (10 mg total) by mouth daily.   amoxicillin-clavulanate 875-125  MG tablet Commonly known as: AUGMENTIN Take 1 tablet by mouth every 12 (twelve) hours for 3 days.   Blood Glucose Monitoring Suppl Devi 1 each by Does not apply route in the morning, at noon, and at bedtime. May substitute to any manufacturer covered by patient's insurance.   cloNIDine 0.2 MG tablet Commonly known as: CATAPRES Take 1 tablet (0.2 mg total) by mouth 2 (two) times daily.   divalproex 500 MG DR  tablet Commonly known as: DEPAKOTE Take 1 tablet (500 mg total) by mouth 3 (three) times daily.   dolutegravir 50 MG tablet Commonly known as: TIVICAY Take 1 tablet (50 mg total) by mouth at bedtime.   guaiFENesin 600 MG 12 hr tablet Commonly known as: MUCINEX Take 1 tablet (600 mg total) by mouth 2 (two) times daily for 5 days.   Lacosamide 150 MG Tabs Take 1 tablet (150 mg total) by mouth 2 (two) times daily.   lactulose 10 GM/15ML solution Commonly known as: CHRONULAC Take 20 g by mouth 3 (three) times daily after meals.   Lantus SoloStar 100 UNIT/ML Solostar Pen Generic drug: insulin glargine Inject 15 Units into the skin daily.   levETIRAcetam 1000 MG tablet Commonly known as: KEPPRA Take 1 tablet (1,000 mg total) by mouth 2 (two) times daily.   LORazepam 0.5 MG tablet Commonly known as: ATIVAN Take 1 tablet (0.5 mg total) by mouth every 6 (six) hours as needed for anxiety.   metFORMIN 500 MG tablet Commonly known as: GLUCOPHAGE Take 1 tablet (500 mg total) by mouth 2 (two) times daily.   nicotine 14 mg/24hr patch Commonly known as: NICODERM CQ - dosed in mg/24 hours Place 1 patch (14 mg total) onto the skin daily. Start taking on: October 23, 2023   omeprazole 40 MG capsule Commonly known as: PRILOSEC Take 1 capsule (40 mg total) by mouth daily.   oseltamivir 75 MG capsule Commonly known as: TAMIFLU Take 1 capsule (75 mg total) by mouth 2 (two) times daily for 2 days.   Pen Needles 5/16" 30G X 8 MM Misc 1 each by Does not apply route daily.   PHENobarbital 64.8 MG tablet Commonly known as: LUMINAL TAKE 1 TABLET(64.8 MG) BY MOUTH TWICE DAILY   psyllium 58.6 % powder Commonly known as: METAMUCIL Take 1 packet by mouth daily.   rosuvastatin 20 MG tablet Commonly known as: CRESTOR Take 1 tablet (20 mg total) by mouth daily.   sertraline 100 MG tablet Commonly known as: ZOLOFT Take 1 tablet (100 mg total) by mouth at bedtime.    triamterene-hydrochlorothiazide 37.5-25 MG tablet Commonly known as: MAXZIDE-25 Take 1 tablet by mouth daily.   Vitamin D 50 MCG (2000 UT) tablet Take 2,000 Units by mouth daily.        Follow-up Information     Moshe Cipro, FNP Follow up in 1 week(s).   Specialty: Family Medicine Contact information: [address] [place] [address] [phone]                  Time coordinating discharge: 39 minutes  Signed:  Daequan Kozma  [facility] 10/22/2023, 12:48 PM

## 2023-10-22 NOTE — TOC Transition Note (Signed)
Transition of Care [facility]) - Discharge Note   Patient Details  Name: Susan [name] MRN: [id number] Date of Birth: 1960-11-21  [facility] CM/SW Contact:  Kermit Balo, RN Phone Number: 10/22/2023, 12:21 PM   Clinical Narrative:     Pt is discharging home with self care. She did not qualify for home oxygen.  Daughter requesting PTAR home as pt is wheelchair bound and they dont have a working vehicle currently.  Once medications delivered from [facility] pharmacy CM will arrange PTAR.  Final next level of care: Home/Self Care Barriers to Discharge: No Barriers Identified   Patient Goals and CMS Choice            Discharge Placement                       Discharge Plan and Services Additional resources added to the After Visit Summary for     Discharge Planning Services: CM Consult                                 Social Drivers of Health (SDOH) Interventions SDOH Screenings   Food Insecurity: No Food Insecurity (09/19/2023)  Housing: Low Risk  (09/19/2023)  Transportation Needs: No Transportation Needs (09/19/2023)  Utilities: Not At Risk (09/19/2023)  Alcohol Screen: Low Risk  (07/11/2017)  Depression (PHQ2-9): Low Risk  (01/25/2023)  Tobacco Use: High Risk (10/19/2023)     Readmission Risk Interventions     No data to display

## 2023-10-22 NOTE — Hospital Course (Addendum)
 [name]

## 2023-10-22 NOTE — Progress Notes (Signed)
SATURATION QUALIFICATIONS: (This note is used to comply with regulatory documentation for home oxygen)  Patient Saturations on Room Air at Rest = 100%   Patient Saturations on Room Air upon Exertion = 99%

## 2023-10-24 ENCOUNTER — Ambulatory Visit: Payer: Self-pay | Admitting: Licensed Clinical Social Worker

## 2023-10-24 LAB — CULTURE, BLOOD (ROUTINE X 2): Culture: NO GROWTH

## 2023-10-24 NOTE — Telephone Encounter (Signed)
LVM for patients daughter in regards to message. Looks like nicotine patch was ordered in the hospital.  Okay to refill patches if patient has not received, or does she need an appt? Please advise!

## 2023-10-24 NOTE — Patient Outreach (Signed)
Care Coordination   Follow Up Visit Note   10/24/2023 Name: Susan [name] MRN: [id number] DOB: 1961/04/19  Susan [name] is a 63 y.o. year old female who sees Moshe Cipro, FNP for primary care. I spoke with  Madelaine Etienne by phone today.  What matters to the patients health and wellness today?  Increasing access to community resources.    Goals Addressed             This Visit's Progress    Increase access to community resources.       Care Coordination Interventions: Assessed Social Determinants of Health Reviewed all upcoming appointments in Epic system Solution-Focused Strategies employed:  Active listening / Reflection utilized  Emotional Support Provided Follow up with landlord for ramp to be built Look for call regarding psychiatry referral Practice self-care when available Dtr mailed in CAP paperwork - follow up with CAP help line to ensure they received it and she is on CAP waitlist           SDOH assessments and interventions completed:  Yes     Care Coordination Interventions:  Yes, provided   Interventions Today    Flowsheet Row Most Recent Value  Chronic Disease   Chronic disease during today's visit Other  [Community Resources]  General Interventions   General Interventions Discussed/Reviewed [facility]  [Dtr mailed in CAP paperwork and is waiting for call back regarding next steps - dtr will call CAP helpline if she does not hear from them in the next week.]  Mental Health Interventions   Mental Health Discussed/Reviewed Mental Health Discussed, Mental Health Reviewed, Depression  [Another referral for psychiatry has been sent by provider - dtr has not heard from a psychiatry office. Dtr plans on bringing this up with provider at her appt next week.]        Follow up plan: Follow up call scheduled for 11/22/2023    Encounter Outcome:  Patient Visit Completed   Kenton Kingfisher, LCSW [facility],  [facility] Licensed Clinical Social Worker Care Coordinator [phone]

## 2023-10-24 NOTE — Patient Instructions (Signed)
  Visit Information  Thank you for taking time to visit with me today. Please don't hesitate to contact me if I can be of assistance to you.   Following are the goals we discussed today:   Goals Addressed             This Visit's Progress    Increase access to community resources.       Care Coordination Interventions: Assessed Social Determinants of Health Reviewed all upcoming appointments in Epic system Solution-Focused Strategies employed:  Active listening / Reflection utilized  Emotional Support Provided Follow up with landlord for ramp to be built Look for call regarding psychiatry referral Practice self-care when available Dtr mailed in CAP paperwork - follow up with CAP help line to ensure they received it and she is on CAP waitlist           Our next appointment is by telephone on 11/22/2023.  Please call the care guide team at [phone] if you need to cancel or reschedule your appointment.   If you are experiencing a Mental Health or Behavioral Health Crisis or need someone to talk to, please call the Suicide and Crisis Lifeline: 988  Patient verbalizes understanding of instructions and care plan provided today and agrees to view in MyChart. Active MyChart status and patient understanding of how to access instructions and care plan via MyChart confirmed with patient.     Telephone follow up appointment with care management team member scheduled for: 11/22/2023

## 2023-10-25 ENCOUNTER — Other Ambulatory Visit: Payer: Self-pay

## 2023-10-25 LAB — CULTURE, BLOOD (SINGLE)

## 2023-10-25 MED ORDER — NICOTINE 14 MG/24HR TD PT24: 14.0000 mg | MEDICATED_PATCH | Freq: Every day | TRANSDERMAL | 1 refills | Status: DC

## 2023-10-25 NOTE — Telephone Encounter (Signed)
Medication has been filled 

## 2023-10-27 ENCOUNTER — Other Ambulatory Visit: Payer: Self-pay | Admitting: Family Medicine

## 2023-10-27 DIAGNOSIS — E119 Type 2 diabetes mellitus without complications: Secondary | ICD-10-CM

## 2023-10-28 NOTE — Progress Notes (Deleted)
   Established Patient Office Visit  Subjective   Patient ID: Susan [name], female    DOB: 1961-03-01  Age: 63 y.o. MRN: [id number]  No chief complaint on file.   HPI  Presents for hospital follow up today. Was admitted to [facility] on 10/19/23-10/22/23 for SOB with URI symptoms. Was flu A positive with secondary sepsis and hypoxia.    Patient presents today for medication management. Reports compliance with medication regimen. Denies the need for refills. Not fasting today. Denies other concerns. Medical history as outlined below.  ROS Per HPI    Objective:     LMP  (LMP Unknown) Comment: neg hcg 07/20/20  Physical Exam   No results found for any visits on 10/29/23.   The ASCVD Risk score (Arnett DK, et al., 2019) failed to calculate for the following reasons:   Risk score cannot be calculated because patient has a medical history suggesting prior/existing ASCVD    Assessment & Plan:   There are no diagnoses linked to this encounter.   No follow-ups on file.    Moshe Cipro, FNP

## 2023-10-29 ENCOUNTER — Inpatient Hospital Stay: Payer: 59 | Admitting: Family Medicine

## 2023-10-30 ENCOUNTER — Encounter: Payer: Self-pay | Admitting: Family Medicine

## 2023-10-30 ENCOUNTER — Telehealth (INDEPENDENT_AMBULATORY_CARE_PROVIDER_SITE_OTHER): Payer: 59 | Admitting: Family Medicine

## 2023-10-30 DIAGNOSIS — L299 Pruritus, unspecified: Secondary | ICD-10-CM

## 2023-10-30 DIAGNOSIS — L239 Allergic contact dermatitis, unspecified cause: Secondary | ICD-10-CM

## 2023-10-30 DIAGNOSIS — Z794 Long term (current) use of insulin: Secondary | ICD-10-CM | POA: Diagnosis not present

## 2023-10-30 DIAGNOSIS — F250 Schizoaffective disorder, bipolar type: Secondary | ICD-10-CM

## 2023-10-30 DIAGNOSIS — E119 Type 2 diabetes mellitus without complications: Secondary | ICD-10-CM

## 2023-10-30 DIAGNOSIS — B20 Human immunodeficiency virus [HIV] disease: Secondary | ICD-10-CM

## 2023-10-30 DIAGNOSIS — K219 Gastro-esophageal reflux disease without esophagitis: Secondary | ICD-10-CM | POA: Diagnosis not present

## 2023-10-30 DIAGNOSIS — I10 Essential (primary) hypertension: Secondary | ICD-10-CM | POA: Diagnosis not present

## 2023-10-30 DIAGNOSIS — R062 Wheezing: Secondary | ICD-10-CM | POA: Diagnosis not present

## 2023-10-30 DIAGNOSIS — R21 Rash and other nonspecific skin eruption: Secondary | ICD-10-CM

## 2023-10-30 DIAGNOSIS — F411 Generalized anxiety disorder: Secondary | ICD-10-CM

## 2023-10-30 DIAGNOSIS — E785 Hyperlipidemia, unspecified: Secondary | ICD-10-CM

## 2023-10-30 MED ORDER — DIVALPROEX SODIUM 500 MG PO DR TAB: 500.0000 mg | DELAYED_RELEASE_TABLET | Freq: Three times a day (TID) | ORAL | 0 refills | Status: DC

## 2023-10-30 MED ORDER — SERTRALINE HCL 100 MG PO TABS: 100.0000 mg | ORAL_TABLET | Freq: Every day | ORAL | 0 refills | Status: DC

## 2023-10-30 MED ORDER — CLONIDINE HCL 0.2 MG PO TABS: 0.2000 mg | ORAL_TABLET | Freq: Two times a day (BID) | ORAL | 1 refills | Status: DC

## 2023-10-30 MED ORDER — ALBUTEROL SULFATE (2.5 MG/3ML) 0.083% IN NEBU: 2.5000 mg | INHALATION_SOLUTION | Freq: Four times a day (QID) | RESPIRATORY_TRACT | 1 refills | Status: DC | PRN

## 2023-10-30 MED ORDER — LANTUS SOLOSTAR 100 UNIT/ML ~~LOC~~ SOPN: 15.0000 [IU] | PEN_INJECTOR | Freq: Every day | SUBCUTANEOUS | 1 refills | Status: DC

## 2023-10-30 MED ORDER — ABACAVIR-DOLUTEGRAVIR-LAMIVUD 600-50-300 MG PO TABS: 1.0000 | ORAL_TABLET | Freq: Every morning | ORAL | 0 refills | Status: DC

## 2023-10-30 MED ORDER — AMLODIPINE BESYLATE 10 MG PO TABS: 10.0000 mg | ORAL_TABLET | Freq: Every day | ORAL | 1 refills | Status: DC

## 2023-10-30 MED ORDER — METFORMIN HCL 500 MG PO TABS: 500.0000 mg | ORAL_TABLET | Freq: Two times a day (BID) | ORAL | 1 refills | Status: DC

## 2023-10-30 MED ORDER — ROSUVASTATIN CALCIUM 20 MG PO TABS
20.0000 mg | ORAL_TABLET | Freq: Every day | ORAL | 1 refills | Status: DC
Start: 2023-10-30 — End: 2024-04-27

## 2023-10-30 MED ORDER — OMEPRAZOLE 40 MG PO CPDR: 40.0000 mg | DELAYED_RELEASE_CAPSULE | Freq: Every day | ORAL | 1 refills | Status: DC

## 2023-10-30 MED ORDER — ACCU-CHEK GUIDE TEST VI STRP: ORAL_STRIP | 12 refills | Status: DC

## 2023-10-30 MED ORDER — LACOSAMIDE 150 MG PO TABS: 150.0000 mg | ORAL_TABLET | Freq: Two times a day (BID) | ORAL | 0 refills | Status: DC

## 2023-10-30 MED ORDER — TRIAMTERENE-HCTZ 37.5-25 MG PO TABS: 1.0000 | ORAL_TABLET | Freq: Every day | ORAL | 1 refills | Status: DC

## 2023-10-30 MED ORDER — ACCU-CHEK SOFTCLIX LANCETS MISC: 12 refills | Status: AC

## 2023-10-30 MED ORDER — LEVETIRACETAM 1000 MG PO TABS: 1000.0000 mg | ORAL_TABLET | Freq: Two times a day (BID) | ORAL | 1 refills | Status: DC

## 2023-10-30 NOTE — Progress Notes (Unsigned)
 Virtual Visit via Video Note  I connected with Susan [name] on 11/01/23 at  4:00 PM EST by a video enabled telemedicine application and verified that I am speaking with the correct person using two identifiers.  Patient Location: Home Provider Location: Office/Clinic  I discussed the limitations, risks, security, and privacy concerns of performing an evaluation and management service by video and the availability of in person appointments. I also discussed with the patient that there may be a patient responsible charge related to this service. The patient expressed understanding and agreed to proceed.  Subjective: PCP: Moshe Cipro, FNP  No chief complaint on file.  HPI 63 year old female presents for hospital follow-up with her daughter acting as historian. She was hospitalized with flu A and was found to be hypoxic and respiratory failure with sepsis. Daughter and patient reports that she is feeling much better, doing much better. Requesting refills of medications today. Denies any concerns with breathing since they have been home.  ROS: Per HPI  Current Outpatient Medications:    acetaminophen (TYLENOL) 325 MG tablet, Take 650 mg by mouth at bedtime as needed for fever or moderate pain (pain score 4-6)., Disp: , Rfl:    albuterol (PROVENTIL) (2.5 MG/3ML) 0.083% nebulizer solution, Take 3 mLs (2.5 mg total) by nebulization every 6 (six) hours as needed for wheezing or shortness of breath., Disp: 150 mL, Rfl: 1   Blood Glucose Monitoring Suppl DEVI, 1 each by Does not apply route in the morning, at noon, and at bedtime. May substitute to any manufacturer covered by patient's insurance., Disp: 1 each, Rfl: 0   Cholecalciferol (VITAMIN D) 50 MCG (2000 UT) tablet, Take 2,000 Units by mouth daily., Disp: , Rfl:    Insulin Pen Needle (PEN NEEDLES 5/16") 30G X 8 MM MISC, 1 each by Does not apply route daily., Disp: 100 each, Rfl: 1   lactulose (CHRONULAC) 10 GM/15ML solution,  Take 20 g by mouth 3 (three) times daily after meals., Disp: , Rfl:    LORazepam (ATIVAN) 0.5 MG tablet, Take 1 tablet (0.5 mg total) by mouth every 6 (six) hours as needed for anxiety., Disp: 60 tablet, Rfl: 0   nicotine (NICODERM CQ - DOSED IN MG/24 HOURS) 14 mg/24hr patch, Place 1 patch (14 mg total) onto the skin daily., Disp: 28 patch, Rfl: 1   PHENobarbital (LUMINAL) 64.8 MG tablet, TAKE 1 TABLET(64.8 MG) BY MOUTH TWICE DAILY, Disp: 180 tablet, Rfl: 0   psyllium (METAMUCIL) 58.6 % powder, Take 1 packet by mouth daily., Disp: , Rfl:    abacavir-dolutegravir-lamiVUDine (TRIUMEQ) 600-50-300 MG tablet, Take 1 tablet by mouth in the morning., Disp: 30 tablet, Rfl: 0   Accu-Chek Softclix Lancets lancets, Use as instructed, Disp: 100 each, Rfl: 12   amLODipine (NORVASC) 10 MG tablet, Take 1 tablet (10 mg total) by mouth daily., Disp: 100 tablet, Rfl: 1   cloNIDine (CATAPRES) 0.2 MG tablet, Take 1 tablet (0.2 mg total) by mouth 2 (two) times daily., Disp: 180 tablet, Rfl: 1   divalproex (DEPAKOTE) 500 MG DR tablet, Take 1 tablet (500 mg total) by mouth 3 (three) times daily., Disp: 180 tablet, Rfl: 0   gabapentin (NEURONTIN) 300 MG capsule, Take 300 mg by mouth 3 (three) times daily., Disp: , Rfl:    glucose blood (ACCU-CHEK GUIDE TEST) test strip, Use as instructed, Disp: 100 strip, Rfl: 12   insulin glargine (LANTUS SOLOSTAR) 100 UNIT/ML Solostar Pen, Inject 15 Units into the skin daily., Disp: 15 mL, Rfl:  1   Lacosamide 150 MG TABS, Take 1 tablet (150 mg total) by mouth 2 (two) times daily., Disp: 60 tablet, Rfl: 0   levETIRAcetam (KEPPRA) 1000 MG tablet, Take 1 tablet (1,000 mg total) by mouth 2 (two) times daily., Disp: 60 tablet, Rfl: 1   metFORMIN (GLUCOPHAGE) 500 MG tablet, Take 1 tablet (500 mg total) by mouth 2 (two) times daily., Disp: 180 tablet, Rfl: 1   omeprazole (PRILOSEC) 40 MG capsule, Take 1 capsule (40 mg total) by mouth daily., Disp: 100 capsule, Rfl: 1   rosuvastatin (CRESTOR) 20  MG tablet, Take 1 tablet (20 mg total) by mouth daily., Disp: 100 tablet, Rfl: 1   sertraline (ZOLOFT) 100 MG tablet, Take 1 tablet (100 mg total) by mouth at bedtime., Disp: 90 tablet, Rfl: 0   triamterene-hydrochlorothiazide (MAXZIDE-25) 37.5-25 MG tablet, Take 1 tablet by mouth daily., Disp: 90 tablet, Rfl: 1  Observations/Objective: Today's Vitals   Physical Exam Vitals and nursing note reviewed.  Constitutional:      General: She is not in acute distress. HENT:     Head: Normocephalic and atraumatic.  Eyes:     Extraocular Movements: Extraocular movements intact.  Pulmonary:     Effort: Pulmonary effort is normal.  Musculoskeletal:     Cervical back: Normal range of motion.  Neurological:     Mental Status: She is alert. Mental status is at baseline.  Psychiatric:        Mood and Affect: Mood normal.    Assessment and Plan: Human immunodeficiency virus (HIV) disease (HCC) -     Abacavir-Dolutegravir-Lamivud; Take 1 tablet by mouth in the morning.  Dispense: 30 tablet; Refill: 0  Diabetes mellitus type 2 without retinopathy (HCC) -     Accu-Chek Guide Test; Use as instructed  Dispense: 100 strip; Refill: 12 -     Lantus SoloStar; Inject 15 Units into the skin daily.  Dispense: 15 mL; Refill: 1 -     metFORMIN HCl; Take 1 tablet (500 mg total) by mouth 2 (two) times daily.  Dispense: 180 tablet; Refill: 1  Essential hypertension -     amLODIPine Besylate; Take 1 tablet (10 mg total) by mouth daily.  Dispense: 100 tablet; Refill: 1 -     cloNIDine HCl; Take 1 tablet (0.2 mg total) by mouth 2 (two) times daily.  Dispense: 180 tablet; Refill: 1 -     Triamterene-HCTZ; Take 1 tablet by mouth daily.  Dispense: 90 tablet; Refill: 1  Schizoaffective disorder, bipolar type (HCC) -     Divalproex Sodium; Take 1 tablet (500 mg total) by mouth 3 (three) times daily.  Dispense: 180 tablet; Refill: 0 -     Lacosamide; Take 1 tablet (150 mg total) by mouth 2 (two) times daily.   Dispense: 60 tablet; Refill: 0 -     levETIRAcetam; Take 1 tablet (1,000 mg total) by mouth 2 (two) times daily.  Dispense: 60 tablet; Refill: 1  GAD (generalized anxiety disorder) -     Sertraline HCl; Take 1 tablet (100 mg total) by mouth at bedtime.  Dispense: 90 tablet; Refill: 0  Gastroesophageal reflux disease, unspecified whether esophagitis present -     Omeprazole; Take 1 capsule (40 mg total) by mouth daily.  Dispense: 100 capsule; Refill: 1  Dyslipidemia -     Rosuvastatin Calcium; Take 1 tablet (20 mg total) by mouth daily.  Dispense: 100 tablet; Refill: 1  Wheezing -     Albuterol Sulfate; Take 3 mLs (2.5 mg  total) by nebulization every 6 (six) hours as needed for wheezing or shortness of breath.  Dispense: 150 mL; Refill: 1 -     For home use only DME Nebulizer machine  Other orders -     Accu-Chek Softclix Lancets; Use as instructed  Dispense: 100 each; Refill: 12    Follow Up Instructions: No follow-ups on file.    Spent 25 mins on AV encounter.  I discussed the assessment and treatment plan with the patient. The patient was provided an opportunity to ask questions, and all were answered. The patient agreed with the plan and demonstrated an understanding of the instructions.   The patient was advised to call back or seek an in-person evaluation if the symptoms worsen or if the condition fails to improve as anticipated.  The above assessment and management plan was discussed with the patient. The patient verbalized understanding of and has agreed to the management plan.   Moshe Cipro, FNP

## 2023-10-31 ENCOUNTER — Ambulatory Visit: Payer: Self-pay

## 2023-10-31 ENCOUNTER — Other Ambulatory Visit: Payer: Self-pay | Admitting: Family Medicine

## 2023-10-31 DIAGNOSIS — F411 Generalized anxiety disorder: Secondary | ICD-10-CM

## 2023-10-31 NOTE — Telephone Encounter (Unsigned)
 Copied from CRM [id number]. Topic: Clinical - Medication Refill >> Oct 31, 2023  2:25 PM Clayton Bibles wrote: Most Recent Primary Care Visit:  Provider: Moshe Cipro  Department: [facility] [place]  Visit Type: MYCHART VIDEO VISIT  Date: 10/30/2023  Medication: gabapentin (NEURONTIN) 300 MG capsule AND LORazepam (ATIVAN) 0.5 MG tablet  Has the patient contacted their pharmacy? Yes (Agent: If no, request that the patient contact the pharmacy for the refill. If patient does not wish to contact the pharmacy document the reason why and proceed with request.) (Agent: If yes, when and what did the pharmacy advise?) Pharmacy needs a doctor's order before refilling  Is this the correct pharmacy for this prescription? Yes If no, delete pharmacy and type the correct one.  This is the patient's preferred pharmacy:   [facility] [phone] - [place], [place] - [address] AT [facility] [address] [place] [address] Phone: [phone] Fax: [phone]   Has the prescription been filled recently? No  Is the patient out of the medication? Yes  Has the patient been seen for an appointment in the last year OR does the patient have an upcoming appointment? Yes  Can we respond through MyChart? No  Agent: Please be advised that Rx refills may take up to 3 business days. We ask that you follow-up with your pharmacy.

## 2023-10-31 NOTE — Patient Instructions (Signed)
 Visit Information  Thank you for taking time to visit with me today. Please don't hesitate to contact me if I can be of assistance to you.   Following are the goals we discussed today:  Continue to take medications as prescribed. Continue to attend provider visits as scheduled Continue to eat healthy, lean meats, vegetables, fruits, avoid saturated and transfats Contact provider with health questions or concerns as needed Continue to check blood sugar as recommended and notify provider if questions or concerns   Our next appointment is by telephone on 11/28/23 at 2:00 pm  Please call the care guide team at [phone] if you need to cancel or reschedule your appointment.   If you are experiencing a Mental Health or Behavioral Health Crisis or need someone to talk to, please call the Suicide and Crisis Lifeline: 988 call the [place] National Suicide Prevention Lifeline: (913)277-8739 or TTY: 5102793948 TTY (863)361-0653) to talk to a trained counselor  Kathyrn Sheriff, RN, MSN, BSN, CCM [place], Population Health Case Manager Phone: [phone]

## 2023-10-31 NOTE — Patient Outreach (Signed)
 Care Coordination   Follow Up Visit Note   10/31/2023 Name: Susan [name] MRN: [id number] DOB: Feb 05, 1961  Susan [name] is a 63 y.o. year old female who sees Moshe Cipro, FNP for primary care. I spoke with [name] Bazzle(dtr) by phone today.  What matters to the patients health and wellness today?  RNCM spoke with patient's daughter who reports patient is doing good. She report patient is eating well, she is walking with rollator walker. Patient continues to work with home health physical therapy. BS range 98-114. Daughter denies that patient is having any breathing problems. Daughter with no questions or concerns at this time.  Goals Addressed             This Visit's Progress    Assist with management of Care/health       Interventions Today    Flowsheet Row Most Recent Value  Chronic Disease   Chronic disease during today's visit Diabetes, Hypertension (HTN)  General Interventions   General Interventions Discussed/Reviewed General Interventions Reviewed, Doctor Visits  [Evaluation of current treatment plan for health condition and patient's adherence to plan.]  Doctor Visits Discussed/Reviewed PCP, Doctor Visits Discussed  PCP/Specialist Visits Compliance with follow-up visit  [reviewed upcoming/scheduled follow up visits.]  Education Interventions   Education Provided Provided Education  Provided Verbal Education On When to see the doctor, Medication, Other, Blood Sugar Monitoring  [advised attend provider visits as scheduled, take medications as prescribed, eat healthy, continue working with home health therapist]  Nutrition Interventions   Nutrition Discussed/Reviewed Nutrition Discussed  [assessed patient nutritional status. Per dauaghter, patient is eating well.]  Pharmacy Interventions   Pharmacy Dicussed/Reviewed Pharmacy Topics Reviewed  [medications reviewed]            SDOH assessments and interventions completed:  No  Care Coordination Interventions:   Yes, provided   Follow up plan: Follow up call scheduled for 11/28/23    Encounter Outcome:  Patient Visit Completed   Kathyrn Sheriff, RN, MSN, BSN, CCM [place]  [facility], Population Health Case Manager Phone: [phone]

## 2023-11-01 ENCOUNTER — Encounter: Payer: Self-pay | Admitting: Family Medicine

## 2023-11-01 MED ORDER — GABAPENTIN 300 MG PO CAPS: 300.0000 mg | ORAL_CAPSULE | Freq: Three times a day (TID) | ORAL | 0 refills | Status: DC

## 2023-11-01 MED ORDER — LORAZEPAM 0.5 MG PO TABS: 0.5000 mg | ORAL_TABLET | Freq: Four times a day (QID) | ORAL | 2 refills | Status: DC | PRN

## 2023-11-08 ENCOUNTER — Other Ambulatory Visit: Payer: Self-pay | Admitting: Family Medicine

## 2023-11-08 ENCOUNTER — Telehealth: Payer: Self-pay

## 2023-11-08 DIAGNOSIS — F250 Schizoaffective disorder, bipolar type: Secondary | ICD-10-CM

## 2023-11-08 DIAGNOSIS — F411 Generalized anxiety disorder: Secondary | ICD-10-CM

## 2023-11-08 MED ORDER — LORAZEPAM 0.5 MG PO TABS: 0.5000 mg | ORAL_TABLET | Freq: Four times a day (QID) | ORAL | 2 refills | Status: DC | PRN

## 2023-11-08 NOTE — Telephone Encounter (Signed)
 Copied from CRM [id number]. Topic: General - Other >> Nov 08, 2023  1:05 PM Rodman Pickle T wrote: Reason for CRM: patient is needing her LORazepam (ATIVAN) 0.5 MG  sent to [facility] [address] it was sent wrong pharmacy

## 2023-11-10 ENCOUNTER — Other Ambulatory Visit: Payer: Self-pay

## 2023-11-10 ENCOUNTER — Emergency Department (HOSPITAL_COMMUNITY)

## 2023-11-10 ENCOUNTER — Emergency Department (HOSPITAL_COMMUNITY)
Admission: EM | Admit: 2023-11-10 | Discharge: 2023-11-11 | Disposition: A | Discharge status: Home / Self Care | Attending: Emergency Medicine | Admitting: Emergency Medicine

## 2023-11-10 DIAGNOSIS — R6889 Other general symptoms and signs: Secondary | ICD-10-CM | POA: Diagnosis not present

## 2023-11-10 DIAGNOSIS — Z79899 Other long term (current) drug therapy: Secondary | ICD-10-CM | POA: Diagnosis not present

## 2023-11-10 DIAGNOSIS — I10 Essential (primary) hypertension: Secondary | ICD-10-CM | POA: Insufficient documentation

## 2023-11-10 DIAGNOSIS — Z8673 Personal history of transient ischemic attack (TIA), and cerebral infarction without residual deficits: Secondary | ICD-10-CM | POA: Diagnosis not present

## 2023-11-10 DIAGNOSIS — Z7984 Long term (current) use of oral hypoglycemic drugs: Secondary | ICD-10-CM | POA: Diagnosis not present

## 2023-11-10 DIAGNOSIS — G40909 Epilepsy, unspecified, not intractable, without status epilepticus: Secondary | ICD-10-CM | POA: Insufficient documentation

## 2023-11-10 DIAGNOSIS — Z21 Asymptomatic human immunodeficiency virus [HIV] infection status: Secondary | ICD-10-CM | POA: Diagnosis not present

## 2023-11-10 DIAGNOSIS — E119 Type 2 diabetes mellitus without complications: Secondary | ICD-10-CM | POA: Diagnosis not present

## 2023-11-10 DIAGNOSIS — R0989 Other specified symptoms and signs involving the circulatory and respiratory systems: Secondary | ICD-10-CM | POA: Diagnosis not present

## 2023-11-10 DIAGNOSIS — R569 Unspecified convulsions: Secondary | ICD-10-CM

## 2023-11-10 DIAGNOSIS — R059 Cough, unspecified: Secondary | ICD-10-CM | POA: Diagnosis not present

## 2023-11-10 DIAGNOSIS — I672 Cerebral atherosclerosis: Secondary | ICD-10-CM | POA: Diagnosis not present

## 2023-11-10 DIAGNOSIS — Z794 Long term (current) use of insulin: Secondary | ICD-10-CM | POA: Insufficient documentation

## 2023-11-10 DIAGNOSIS — G9389 Other specified disorders of brain: Secondary | ICD-10-CM | POA: Diagnosis not present

## 2023-11-10 DIAGNOSIS — I6523 Occlusion and stenosis of bilateral carotid arteries: Secondary | ICD-10-CM | POA: Diagnosis not present

## 2023-11-10 LAB — CBC WITH DIFFERENTIAL/PLATELET
Abs Immature Granulocytes: 0.03 10*3/uL (ref 0.00–0.07)
Basophils Absolute: 0 10*3/uL (ref 0.0–0.1)
Basophils Relative: 1 %
Eosinophils Absolute: 0.1 10*3/uL (ref 0.0–0.5)
Eosinophils Relative: 2 %
HCT: 42.1 % (ref 36.0–46.0)
Hemoglobin: 13.9 g/dL (ref 12.0–15.0)
Immature Granulocytes: 0 %
Lymphocytes Relative: 42 %
Lymphs Abs: 3.2 10*3/uL (ref 0.7–4.0)
MCH: 33.4 pg (ref 26.0–34.0)
MCHC: 33 g/dL (ref 30.0–36.0)
MCV: 101.2 fL — ABNORMAL HIGH (ref 80.0–100.0)
Monocytes Absolute: 0.6 10*3/uL (ref 0.1–1.0)
Monocytes Relative: 8 %
Neutro Abs: 3.7 10*3/uL (ref 1.7–7.7)
Neutrophils Relative %: 47 %
Platelets: 239 10*3/uL (ref 150–400)
RBC: 4.16 MIL/uL (ref 3.87–5.11)
RDW: 15.1 % (ref 11.5–15.5)
WBC: 7.7 10*3/uL (ref 4.0–10.5)
nRBC: 0 % (ref 0.0–0.2)

## 2023-11-10 LAB — VALPROIC ACID LEVEL: Valproic Acid Lvl: 38 ug/mL — ABNORMAL LOW (ref 50.0–100.0)

## 2023-11-10 LAB — COMPREHENSIVE METABOLIC PANEL
ALT: 13 U/L (ref 0–44)
AST: 18 U/L (ref 15–41)
Albumin: 2.8 g/dL — ABNORMAL LOW (ref 3.5–5.0)
Alkaline Phosphatase: 110 U/L (ref 38–126)
Anion gap: 13 (ref 5–15)
BUN: 16 mg/dL (ref 8–23)
CO2: 25 mmol/L (ref 22–32)
Calcium: 8.8 mg/dL — ABNORMAL LOW (ref 8.9–10.3)
Chloride: 102 mmol/L (ref 98–111)
Creatinine, Ser: 1.18 mg/dL — ABNORMAL HIGH (ref 0.44–1.00)
GFR, Estimated: 53 mL/min — ABNORMAL LOW (ref >60)
Glucose, Bld: 117 mg/dL — ABNORMAL HIGH (ref 70–99)
Potassium: 4.1 mmol/L (ref 3.5–5.1)
Sodium: 140 mmol/L (ref 135–145)
Total Bilirubin: 0.4 mg/dL (ref 0.0–1.2)
Total Protein: 6.3 g/dL — ABNORMAL LOW (ref 6.5–8.1)

## 2023-11-10 LAB — CBG MONITORING, ED: Glucose-Capillary: 117 mg/dL — ABNORMAL HIGH (ref 70–99)

## 2023-11-10 MED ORDER — LEVETIRACETAM IN NACL 1500 MG/100ML IV SOLN
1500.0000 mg | Freq: Once | INTRAVENOUS | Status: AC
  Administered 2023-11-11: 1500 mg via INTRAVENOUS
  Filled 2023-11-10: qty 100

## 2023-11-10 MED ORDER — SODIUM CHLORIDE 0.9 % IV BOLUS
1000.0000 mL | Freq: Once | INTRAVENOUS | Status: AC
  Administered 2023-11-10: 1000 mL via INTRAVENOUS

## 2023-11-10 MED ORDER — LORAZEPAM 2 MG/ML IJ SOLN
1.0000 mg | Freq: Once | INTRAMUSCULAR | Status: AC
  Administered 2023-11-10: 1 mg via INTRAVENOUS
  Filled 2023-11-10: qty 1

## 2023-11-10 NOTE — ED Triage Notes (Signed)
 Pt BIB [facility] from home. Per report pt had 2 seizures today, both lasting 5 mins. On EMS arrival pt was laying in the floor following minimal commands, a/o x2. Pt has hx of seizures and per daughter pt has been compliant with meds.

## 2023-11-10 NOTE — ED Provider Notes (Signed)
  EMERGENCY DEPARTMENT AT [facility] Provider Note   CSN: [id number] Arrival date & time: 11/10/23  1956     History  Chief Complaint  Patient presents with   Seizures    Susan [name] is a 63 y.o. female.  Patient brought in by EMS after having 2 seizures at home today.  EMS reports that patient was in the floor of her home when they arrived.  Patient has a past medical history of a seizure disorder.  Patient is currently awake she denies any complaints.  Patient states that her nose is bothering her.  Patient denies any coughing she denies any fever denies any chills.  Patient does not recall having any seizures today.  Patient denies any area of pain. I spoke with patient's daughter Oswaldo Done Spinx.  She reports patient's behavior today has been unusual.  She reports patient has had 2 seizures.  She states patient has also had a runny nose and a cough.  She states that she went to check on her mother in her room and she was lying on the floor rolling side-to-side.  She was concerned that her mother would hit her head on something so she held her in a blanket.  Patient's daughter reports that patient has been acting unusual all day she states patient told her that she did not want to eat or drink anymore that she did not want to live any longer.  Daughter states this is very unusual for patient.  She states patient does have a history of psychiatric issues.  She tells me that her mother was recently hospitalized with flu but has been doing well since that hospitalization.  The history is provided by the patient. No language interpreter was used.  Seizures Seizure type:  Unable to specify Initial focality:  None Postictal symptoms: confusion   Number of seizures this episode:  2 Progression:  Worsening      Home Medications Prior to Admission medications   Medication Sig Start Date End Date Taking? Authorizing Provider  abacavir-dolutegravir-lamiVUDine (TRIUMEQ)  600-50-300 MG tablet Take 1 tablet by mouth in the morning. 10/30/23   Moshe Cipro, FNP  Accu-Chek Softclix Lancets lancets Use as instructed 10/30/23   Moshe Cipro, FNP  acetaminophen (TYLENOL) 325 MG tablet Take 650 mg by mouth at bedtime as needed for fever or moderate pain (pain score 4-6).    [provider]  albuterol (PROVENTIL) (2.5 MG/3ML) 0.083% nebulizer solution Take 3 mLs (2.5 mg total) by nebulization every 6 (six) hours as needed for wheezing or shortness of breath. 10/30/23   Moshe Cipro, FNP  amLODipine (NORVASC) 10 MG tablet Take 1 tablet (10 mg total) by mouth daily. 10/30/23   Moshe Cipro, FNP  Blood Glucose Monitoring Suppl DEVI 1 each by Does not apply route in the morning, at noon, and at bedtime. May substitute to any manufacturer covered by patient's insurance. 09/13/23   Moshe Cipro, FNP  Cholecalciferol (VITAMIN D) 50 MCG (2000 UT) tablet Take 2,000 Units by mouth daily.    [provider]  cloNIDine (CATAPRES) 0.2 MG tablet Take 1 tablet (0.2 mg total) by mouth 2 (two) times daily. 10/30/23   Moshe Cipro, FNP  divalproex (DEPAKOTE) 500 MG DR tablet Take 1 tablet (500 mg total) by mouth 3 (three) times daily. 10/30/23   Moshe Cipro, FNP  gabapentin (NEURONTIN) 300 MG capsule Take 1 capsule (300 mg total) by mouth 3 (three) times daily. 11/01/23   Moshe Cipro, FNP  glucose blood (ACCU-CHEK GUIDE TEST) test strip Use as instructed 10/30/23   Moshe Cipro, FNP  insulin glargine (LANTUS SOLOSTAR) 100 UNIT/ML Solostar Pen Inject 15 Units into the skin daily. 10/30/23   Moshe Cipro, FNP  Insulin Pen Needle (PEN NEEDLES 5/16") 30G X 8 MM MISC 1 each by Does not apply route daily. 09/13/23   Moshe Cipro, FNP  Lacosamide 150 MG TABS Take 1 tablet (150 mg total) by mouth 2 (two) times daily. 10/30/23   Moshe Cipro, FNP  lactulose (CHRONULAC) 10 GM/15ML solution Take 20 g by mouth 3  (three) times daily after meals. 10/25/22   [provider]  levETIRAcetam (KEPPRA) 1000 MG tablet Take 1 tablet (1,000 mg total) by mouth 2 (two) times daily. 10/30/23   Moshe Cipro, FNP  LORazepam (ATIVAN) 0.5 MG tablet Take 1 tablet (0.5 mg total) by mouth every 6 (six) hours as needed for anxiety. 11/08/23   Moshe Cipro, FNP  metFORMIN (GLUCOPHAGE) 500 MG tablet Take 1 tablet (500 mg total) by mouth 2 (two) times daily. 10/30/23   Moshe Cipro, FNP  nicotine (NICODERM CQ - DOSED IN MG/24 HOURS) 14 mg/24hr patch Place 1 patch (14 mg total) onto the skin daily. 10/25/23   Moshe Cipro, FNP  omeprazole (PRILOSEC) 40 MG capsule Take 1 capsule (40 mg total) by mouth daily. 10/30/23   Moshe Cipro, FNP  PHENobarbital (LUMINAL) 64.8 MG tablet TAKE 1 TABLET(64.8 MG) BY MOUTH TWICE DAILY 10/18/23   Moshe Cipro, FNP  psyllium (METAMUCIL) 58.6 % powder Take 1 packet by mouth daily.    [provider]  rosuvastatin (CRESTOR) 20 MG tablet Take 1 tablet (20 mg total) by mouth daily. 10/30/23   Moshe Cipro, FNP  sertraline (ZOLOFT) 100 MG tablet Take 1 tablet (100 mg total) by mouth at bedtime. 10/30/23   Moshe Cipro, FNP  triamterene-hydrochlorothiazide (MAXZIDE-25) 37.5-25 MG tablet Take 1 tablet by mouth daily. 10/30/23   Moshe Cipro, FNP      Allergies    Lyrica [pregabalin]    Review of Systems   Review of Systems  Neurological:  Positive for seizures.  All other systems reviewed and are negative.   Physical Exam Updated Vital Signs BP 137/62 (BP Location: Right Arm)   Pulse 87   Temp 98.8 F (37.1 C) (Oral)   Resp (!) 21   Ht 5\' 2"  (1.575 m)   Wt 128 kg   LMP  (LMP Unknown) Comment: neg hcg 07/20/20  SpO2 100%   BMI 51.61 kg/m  Physical Exam Vitals and nursing note reviewed.  Constitutional:      Appearance: She is well-developed.  HENT:     Head: Normocephalic.     Right Ear: External ear normal.      Left Ear: External ear normal.     Nose: Nose normal.     Mouth/Throat:     Mouth: Mucous membranes are moist.  Eyes:     Pupils: Pupils are equal, round, and reactive to light.  Cardiovascular:     Rate and Rhythm: Normal rate.  Pulmonary:     Effort: Pulmonary effort is normal.  Abdominal:     General: There is no distension.  Musculoskeletal:        General: Normal range of motion.     Cervical back: Normal range of motion.  Skin:    General: Skin is warm.  Neurological:     General: No focal deficit present.     Mental Status: She is alert and  oriented to person, place, and time.  Psychiatric:        Mood and Affect: Mood normal.     ED Results / Procedures / Treatments   Labs (all labs ordered are listed, but only abnormal results are displayed) Labs Reviewed  CBC WITH DIFFERENTIAL/PLATELET - Abnormal; Notable for the following components:      Result Value   MCV 101.2 (*)    All other components within normal limits  COMPREHENSIVE METABOLIC PANEL - Abnormal; Notable for the following components:   Glucose, Bld 117 (*)    Creatinine, Ser 1.18 (*)    Calcium 8.8 (*)    Total Protein 6.3 (*)    Albumin 2.8 (*)    GFR, Estimated 53 (*)    All other components within normal limits  VALPROIC ACID LEVEL - Abnormal; Notable for the following components:   Valproic Acid Lvl 38 (*)    All other components within normal limits  CBG MONITORING, ED - Abnormal; Notable for the following components:   Glucose-Capillary 117 (*)    All other components within normal limits  RESP PANEL BY RT-PCR (RSV, FLU A&B, COVID)  RVPGX2  URINALYSIS, ROUTINE W REFLEX MICROSCOPIC  ETHANOL  RAPID URINE DRUG SCREEN, HOSP PERFORMED    EKG None  Radiology No results found.  Procedures Procedures    Medications Ordered in ED Medications  sodium chloride 0.9 % bolus 1,000 mL (has no administration in time range)  LORazepam (ATIVAN) injection 1 mg (1 mg Intravenous Given 11/10/23 2118)     ED Course/ Medical Decision Making/ A&P Clinical Course as of 11/10/23 2259  Wynelle Link Nov 10, 2023  2151 Glucose-Capillary(!): 117 [LS]    Clinical Course User Index [LS] Elson Areas, [place]                                 Medical Decision Making Patient currently denies any complaints.  Patient states that she is hungry.  Patient is requesting something to drink.  Patient denies any cough or cold symptoms.  Patient states that she does have seizures  Amount and/or Complexity of Data Reviewed Independent Historian:     Details: The history provided by patient's daughter who is her caregiver External Data Reviewed: notes.    Details: Notes reviewed from last hospitalization Labs: ordered. Decision-making details documented in ED Course.    Details: Labs ordered reviewed and interpreted.  Creatinine is elevated at 1.18 Radiology: ordered and independent interpretation performed. Decision-making details documented in ED Course.    Details: CT head and chest x-ray ordered  Risk Prescription drug management.           Final Clinical Impression(s) / ED Diagnoses Final diagnoses:  Seizure [facility])    Rx / DC Orders ED Discharge Orders     None         Elson Areas, [place] 11/10/23 2315    Loetta Rough, MD 11/11/23 [phone]

## 2023-11-10 NOTE — ED Notes (Signed)
 Please call daughter Oswaldo Done Rogacki) [phone]

## 2023-11-11 DIAGNOSIS — R569 Unspecified convulsions: Secondary | ICD-10-CM | POA: Diagnosis not present

## 2023-11-11 DIAGNOSIS — Z7401 Bed confinement status: Secondary | ICD-10-CM | POA: Diagnosis not present

## 2023-11-11 DIAGNOSIS — G40909 Epilepsy, unspecified, not intractable, without status epilepticus: Secondary | ICD-10-CM | POA: Diagnosis not present

## 2023-11-11 DIAGNOSIS — Z743 Need for continuous supervision: Secondary | ICD-10-CM | POA: Diagnosis not present

## 2023-11-11 LAB — RESP PANEL BY RT-PCR (RSV, FLU A&B, COVID)  RVPGX2
Influenza A by PCR: NEGATIVE
Influenza B by PCR: NEGATIVE
Resp Syncytial Virus by PCR: NEGATIVE
SARS Coronavirus 2 by RT PCR: NEGATIVE

## 2023-11-11 LAB — URINALYSIS, ROUTINE W REFLEX MICROSCOPIC
Bilirubin Urine: NEGATIVE
Glucose, UA: NEGATIVE mg/dL
Ketones, ur: NEGATIVE mg/dL
Leukocytes,Ua: NEGATIVE
Nitrite: NEGATIVE
Protein, ur: NEGATIVE mg/dL
Specific Gravity, Urine: 1.019 (ref 1.005–1.030)
pH: 5 (ref 5.0–8.0)

## 2023-11-11 LAB — ETHANOL: Alcohol, Ethyl (B): <10 mg/dL (ref <10)

## 2023-11-11 LAB — RAPID URINE DRUG SCREEN, HOSP PERFORMED
Amphetamines: NOT DETECTED
Barbiturates: POSITIVE — AB
Benzodiazepines: NOT DETECTED
Cocaine: NOT DETECTED
Opiates: NOT DETECTED
Tetrahydrocannabinol: NOT DETECTED

## 2023-11-11 NOTE — ED Provider Notes (Signed)
  Physical Exam  BP (!) 145/67   Pulse 82   Temp 98.8 F (37.1 C) (Oral)   Resp 18   Ht 5\' 2"  (1.575 m)   Wt 128 kg   LMP  (LMP Unknown) Comment: neg hcg 07/20/20  SpO2 100%   BMI 51.61 kg/m   Physical Exam  Procedures  Procedures  ED Course / MDM   Clinical Course as of 11/11/23 0142  Wynelle Link Nov 10, 2023  2151 Glucose-Capillary(!): 117 [LS]    Clinical Course User Index [LS] Osie Cheeks   Medical Decision Making Amount and/or Complexity of Data Reviewed Labs: ordered. Decision-making details documented in ED Course. Radiology: ordered.  Risk Prescription drug management.   Patient care assumed at shift handoff from previous provider.  Please see her note for full details.  In short, patient with history of of hypertension, schizoaffective disorder, HIV, GERD, type II DM, seizures on Keppra, TIA presents to the emergency department after 2 reported seizure activities at home.  Patient awake at time of assessment denying complaints other than runny nose.  Patient's daughter was contacted and reported that patient's behavior have been unusual during the day with the 2 seizures, runny nose and cough, and decreased appetite.  At time of shift handoff disposition pending results of urinalysis, CT head, and respiratory panel.  Respiratory panel and urinalysis grossly unremarkable.  CT head with no acute findings.  I discussed the patient's condition and evaluation with the patient's daughter over the phone.  I explained that we had given the patient a dose of Keppra here in the emergency department and that the patient had had no further complaints or seizure like episodes.  Patient is stable for discharge.  Patient's daughter voices understanding and plans to have patient follow-up as outpatient with her neurologist.       Pamala Duffel 11/11/23 0142    Nira Conn, MD 11/11/23 [phone]

## 2023-11-11 NOTE — ED Notes (Signed)
 Pt continues to attempt to get out of the bed and throwing legs over the railing. Pts bed in lowest position, seizure pads on, call light in reach.

## 2023-11-11 NOTE — ED Notes (Signed)
PTAR CALLED  °

## 2023-11-11 NOTE — ED Notes (Signed)
 Floor matt placed beside pts bed.

## 2023-11-11 NOTE — ED Notes (Signed)
 Pt's daughter updated.

## 2023-11-11 NOTE — Discharge Instructions (Signed)
 Your workup today was reassuring.  You were administered Keppra here in the emergency department.  Please schedule a follow-up ointment with your neurologist for further evaluation as needed.  If you develop any life-threatening symptoms please return to the emergency department.

## 2023-11-12 DIAGNOSIS — R296 Repeated falls: Secondary | ICD-10-CM | POA: Diagnosis not present

## 2023-11-12 DIAGNOSIS — J45901 Unspecified asthma with (acute) exacerbation: Secondary | ICD-10-CM | POA: Diagnosis not present

## 2023-11-12 DIAGNOSIS — E1110 Type 2 diabetes mellitus with ketoacidosis without coma: Secondary | ICD-10-CM | POA: Diagnosis not present

## 2023-11-12 LAB — LEVETIRACETAM LEVEL: Levetiracetam Lvl: 22.3 ug/mL (ref 10.0–40.0)

## 2023-11-15 ENCOUNTER — Other Ambulatory Visit: Payer: Self-pay | Admitting: Family Medicine

## 2023-11-15 ENCOUNTER — Telehealth: Payer: Self-pay

## 2023-11-15 DIAGNOSIS — F250 Schizoaffective disorder, bipolar type: Secondary | ICD-10-CM

## 2023-11-15 MED ORDER — LEVETIRACETAM 1000 MG PO TABS: 1000.0000 mg | ORAL_TABLET | Freq: Two times a day (BID) | ORAL | 1 refills | Status: DC

## 2023-11-15 NOTE — Telephone Encounter (Signed)
 Spoke with patients daughter, duplicate request

## 2023-11-15 NOTE — Telephone Encounter (Signed)
 Copied from CRM [id number]. Topic: Clinical - Medication Question >> Nov 15, 2023  2:25 PM [name] wrote: Reason for CRM: Patient's daughter [name] was just speaking with the nurse on patient's med levETIRAcetam (KEPPRA) 1000 MG tablet. [facility] called and states patient does not have any refills. Aquinna apologize for giving the nurse the wrong information earlier. Patient does has 5 days of meds still. Please call back at [phone].   [facility] [phone] - [place], [place] - [address] AT [address] [place] [address] Phone:[phone]:[phone]  Per CAL, nurse is with patient send a message.

## 2023-11-22 ENCOUNTER — Ambulatory Visit: Payer: Self-pay | Admitting: Licensed Clinical Social Worker

## 2023-11-22 NOTE — Patient Instructions (Signed)
 Visit Information  Thank you for taking time to visit with me today. Please don't hesitate to contact me if I can be of assistance to you.   Following are the goals we discussed today:   Goals Addressed             This Visit's Progress    COMPLETED: Increase access to community resources.       Care Coordination Interventions: Assessed Social Determinants of Health Reviewed all upcoming appointments in Epic system Solution-Focused Strategies employed:  Active listening / Reflection utilized  Emotional Support Provided Follow up with landlord for ramp to be built Look for call regarding psychiatry referral Practice self-care when available Dtr mailed in CAP paperwork - follow up with CAP help line to ensure they received it and she is on CAP waitlist           Please call the care guide team at [phone] if you need to cancel or reschedule your appointment.   If you are experiencing a Mental Health or Behavioral Health Crisis or need someone to talk to, please call the Suicide and Crisis Lifeline: 988  Patient verbalizes understanding of instructions and care plan provided today and agrees to view in MyChart. Active MyChart status and patient understanding of how to access instructions and care plan via MyChart confirmed with patient.     No further follow up required: Please contact your provider if you would like to reconnect.

## 2023-11-22 NOTE — Patient Outreach (Signed)
 Care Coordination   Follow Up Visit Note   11/22/2023 Name: Susan [name] MRN: [id number] DOB: 1960/11/24  Susan [name] is a 63 y.o. year old female who sees Susan Cipro, FNP for primary care. I spoke with patient's caregiver Susan [name] over the phone  What matters to the patients health and wellness today?  Increasing access to community resources.    Goals Addressed             This Visit's Progress    COMPLETED: Increase access to community resources.       Care Coordination Interventions: Assessed Social Determinants of Health Reviewed all upcoming appointments in Epic system Solution-Focused Strategies employed:  Active listening / Reflection utilized  Emotional Support Provided Follow up with landlord for ramp to be built Look for call regarding psychiatry referral Practice self-care when available Dtr mailed in CAP paperwork - follow up with CAP help line to ensure they received it and she is on CAP waitlist           SDOH assessments and interventions completed:  Yes     Care Coordination Interventions:  Yes, provided   Interventions Today    Flowsheet Row Most Recent Value  Chronic Disease   Chronic disease during today's visit Other  [SDOH]  General Interventions   General Interventions Discussed/Reviewed General Interventions Discussed, [employer] spoke with dtr Susan [name] regarding CAP program and other home needs. Dtr reported that she was doing well caring for patient at this time and denied any needs. Dtr has not heard from CAP program since mailing application but plans on following up w/them]  Mental Health Interventions   Mental Health Discussed/Reviewed Mental Health Reviewed, Coping Strategies        Follow up plan: No further intervention required.   Encounter Outcome:  Patient Visit Completed   Susan Kingfisher, LCSW [facility], [facility] Licensed Clinical Social Worker Care  Coordinator [phone]

## 2023-11-28 ENCOUNTER — Ambulatory Visit: Payer: Self-pay

## 2023-11-28 NOTE — Patient Outreach (Signed)
 Care Coordination   Follow Up Visit Note   11/28/2023 Name: Susan [name] MRN: [id number] DOB: 1960/12/08  Susan [name] is a 63 y.o. year old female who sees Moshe Cipro, FNP for primary care. I spoke with daughter, Oswaldo Done Deignan(caregiver/dpr), by phone today.  What matters to the patients health and wellness today?  Daughter reports patient is doing "pretty good". She reports patient is eating well and drinking more water now. Patient continues to work with home health [phone] with PT/OT. Daughter reports patient BS range 114-129 and BP last checked was 114/62. She states patient is a pleasant person to be around and expresses how happy she is that patient is with her. Daughter denies any questions or concerns. Denies any care management needs. No care management needs identified. Daughter to contact RNCM and/or PCP if care management needs in the future. Patient has an upcoming appointment with PCP on 12/13/23.   Goals Addressed             This Visit's Progress    COMPLETED: Assist with management of Care/health       Interventions Today    Flowsheet Row Most Recent Value  Chronic Disease   Chronic disease during today's visit Hypertension (HTN), Diabetes  General Interventions   General Interventions Discussed/Reviewed General Interventions Reviewed, Doctor Visits  [Evaluation of current treatment plan for health condition and patient's adherence to plan. encouraged to contact RNCM if care management needs in the future. confirmed daughter has RNCM contact number.]  Doctor Visits Discussed/Reviewed PCP, Specialist  PCP/Specialist Visits Compliance with follow-up visit  [reviewed upcoming/scheduled appointments with daughter.]  Education Interventions   Education Provided Provided Education  Provided Verbal Education On When to see the doctor, Medication, Blood Sugar Monitoring, Other  [advised to take medications as prescribed, attend provider visits as scheduled, eat  healthy, contact provider with health questions or concerns.]  Nutrition Interventions   Nutrition Discussed/Reviewed Nutrition Reviewed  Pharmacy Interventions   Pharmacy Dicussed/Reviewed Pharmacy Topics Reviewed  [medications reviewed with daughter]            SDOH assessments and interventions completed:  No  Care Coordination Interventions:  Yes, provided   Follow up plan: No further intervention required.   Encounter Outcome:  Patient Visit Completed   Kathyrn Sheriff, RN, MSN, BSN, CCM [place]  [facility], Population Health Case Manager Phone: [phone]

## 2023-11-28 NOTE — Patient Instructions (Signed)
 Visit Information  Thank you for taking time to visit with me today. Please don't hesitate to contact me if I can be of assistance to you.   Following are the goals we discussed today:  Continue to take medications as prescribed. Continue to attend provider visits as scheduled Continue to eat healthy, lean meats, vegetables, fruits, avoid saturated and transfats Contact provider with health questions or concerns as needed Continue to check blood sugar as recommended and notify provider if questions or concerns Continue to check blood pressure routinely and contact provider if questions or concerns   Please call the care guide team at [phone] if you need to cancel or reschedule your appointment.   If you are experiencing a Mental Health or Behavioral Health Crisis or need someone to talk to, please call the Suicide and Crisis Lifeline: 988 call the [place] National Suicide Prevention Lifeline: 8022698318 or TTY: 2520717063 TTY [phone]) to talk to a trained counselor   Kathyrn Sheriff, RN, MSN, BSN, CCM [place]  [facility], Population Health Case Manager Phone: [phone]

## 2023-12-07 NOTE — Progress Notes (Signed)
 Pt had a cough for 2 days prior to seizure. Flu covid and RSv ordered due to this history

## 2023-12-12 ENCOUNTER — Other Ambulatory Visit: Payer: Self-pay | Admitting: Family Medicine

## 2023-12-12 DIAGNOSIS — F250 Schizoaffective disorder, bipolar type: Secondary | ICD-10-CM

## 2023-12-12 NOTE — Telephone Encounter (Signed)
 Copied from CRM [id number]. Topic: Clinical - Medication Refill >> Dec 12, 2023 11:28 AM Drema Balzarine wrote: Most Recent Primary Care Visit:  Provider: Sherald Barge  Department: [facility] [place]  Visit Type: MYCHART VIDEO VISIT  Date: 10/30/2023  Medication: PHENobarbital   Has the patient contacted their pharmacy? Yes (Agent: If no, request that the patient contact the pharmacy for the refill. If patient does not wish to contact the pharmacy document the reason why and proceed with request.) (Agent: If yes, when and what did the pharmacy advise?)  Is this the correct pharmacy for this prescription? Yes If no, delete pharmacy and type the correct one.  This is the patient's preferred pharmacy:   [facility] [phone] - [place], [place] - [address] AT [facility] [address] [place] [address] Phone: [phone] Fax: [phone]   Has the prescription been filled recently? Yes  Is the patient out of the medication? Yes  Has the patient been seen for an appointment in the last year OR does the patient have an upcoming appointment? Yes  Can we respond through MyChart? No  Agent: Please be advised that Rx refills may take up to 3 business days. We ask that you follow-up with your pharmacy.

## 2023-12-13 ENCOUNTER — Ambulatory Visit: Payer: 59 | Admitting: Family Medicine

## 2023-12-13 ENCOUNTER — Other Ambulatory Visit: Payer: Self-pay | Admitting: Family Medicine

## 2023-12-13 DIAGNOSIS — F411 Generalized anxiety disorder: Secondary | ICD-10-CM

## 2023-12-13 DIAGNOSIS — F250 Schizoaffective disorder, bipolar type: Secondary | ICD-10-CM

## 2023-12-13 DIAGNOSIS — J45901 Unspecified asthma with (acute) exacerbation: Secondary | ICD-10-CM | POA: Diagnosis not present

## 2023-12-13 DIAGNOSIS — R296 Repeated falls: Secondary | ICD-10-CM | POA: Diagnosis not present

## 2023-12-13 DIAGNOSIS — B20 Human immunodeficiency virus [HIV] disease: Secondary | ICD-10-CM

## 2023-12-13 DIAGNOSIS — E1110 Type 2 diabetes mellitus with ketoacidosis without coma: Secondary | ICD-10-CM | POA: Diagnosis not present

## 2023-12-13 MED ORDER — PHENOBARBITAL 64.8 MG PO TABS: 64.8000 mg | ORAL_TABLET | Freq: Two times a day (BID) | ORAL | 1 refills | Status: DC

## 2023-12-15 ENCOUNTER — Other Ambulatory Visit: Payer: Self-pay | Admitting: Family Medicine

## 2023-12-15 DIAGNOSIS — I10 Essential (primary) hypertension: Secondary | ICD-10-CM

## 2023-12-15 DIAGNOSIS — F411 Generalized anxiety disorder: Secondary | ICD-10-CM

## 2024-01-02 ENCOUNTER — Ambulatory Visit: Admitting: Family Medicine

## 2024-01-02 NOTE — Progress Notes (Deleted)
   Established Patient Office Visit  Subjective   Patient ID: Susan [name], female    DOB: Jul 17, 1961  Age: 63 y.o. MRN: [id number]  No chief complaint on file.   HPI Patient presents today for medication management. Reports compliance with medication regimen. Denies the need for refills. Not fasting today. Denies other concerns. Medical history as outlined below.  ROS Per HPI    Objective:     LMP  (LMP Unknown) Comment: neg hcg 07/20/20  Physical Exam   No results found for any visits on 01/02/24.   The ASCVD Risk score (Arnett DK, et al., 2019) failed to calculate for the following reasons:   Risk score cannot be calculated because patient has a medical history suggesting prior/existing ASCVD    Assessment & Plan:   There are no diagnoses linked to this encounter.   No follow-ups on file.    Wellington Half, FNP

## 2024-01-11 ENCOUNTER — Other Ambulatory Visit: Payer: Self-pay | Admitting: Family Medicine

## 2024-01-11 DIAGNOSIS — F250 Schizoaffective disorder, bipolar type: Secondary | ICD-10-CM

## 2024-01-12 ENCOUNTER — Other Ambulatory Visit: Payer: Self-pay | Admitting: Family Medicine

## 2024-01-12 DIAGNOSIS — F250 Schizoaffective disorder, bipolar type: Secondary | ICD-10-CM

## 2024-01-12 DIAGNOSIS — R296 Repeated falls: Secondary | ICD-10-CM | POA: Diagnosis not present

## 2024-01-12 DIAGNOSIS — E1110 Type 2 diabetes mellitus with ketoacidosis without coma: Secondary | ICD-10-CM | POA: Diagnosis not present

## 2024-01-12 DIAGNOSIS — J45901 Unspecified asthma with (acute) exacerbation: Secondary | ICD-10-CM | POA: Diagnosis not present

## 2024-01-13 ENCOUNTER — Telehealth: Payer: Self-pay | Admitting: Family Medicine

## 2024-01-13 NOTE — Telephone Encounter (Signed)
 Copied from CRM [id number]. Topic: Clinical - Medication Refill >> Jan 13, 2024  9:40 AM Albertha Alosa wrote: Medication: Lacosamide  150 MG TABS  Has the patient contacted their pharmacy? Yes (Agent: If no, request that the patient contact the pharmacy for the refill. If patient does not wish to contact the pharmacy document the reason why and proceed with request.) (Agent: If yes, when and what did the pharmacy advise?)  This is the patient's preferred pharmacy:  [facility] [phone] - [place], [place] - [address] AT [facility] OF [address] [place] [address] Phone: [phone] Fax: [phone]   Is this the correct pharmacy for this prescription? Yes If no, delete pharmacy and type the correct one.   Has the prescription been filled recently? No  Is the patient out of the medication? Yes  Has the patient been seen for an appointment in the last year OR does the patient have an upcoming appointment? Yes  Can we respond through MyChart? Yes  Agent: Please be advised that Rx refills may take up to 3 business days. We ask that you follow-up with your pharmacy.

## 2024-01-14 ENCOUNTER — Encounter: Payer: Self-pay | Admitting: Family Medicine

## 2024-01-24 ENCOUNTER — Telehealth: Payer: Self-pay

## 2024-01-24 ENCOUNTER — Encounter: Payer: Self-pay | Admitting: Family Medicine

## 2024-01-24 ENCOUNTER — Ambulatory Visit: Admitting: Family Medicine

## 2024-01-24 NOTE — Telephone Encounter (Signed)
 Routed.

## 2024-01-29 NOTE — Telephone Encounter (Signed)
 Unfortunately I am getting to this message late. When viewing patient chart it seems that she has since been dismissed from our practice

## 2024-01-30 ENCOUNTER — Other Ambulatory Visit: Payer: Self-pay | Admitting: Family Medicine

## 2024-01-30 ENCOUNTER — Telehealth

## 2024-01-30 DIAGNOSIS — E119 Type 2 diabetes mellitus without complications: Secondary | ICD-10-CM

## 2024-01-30 NOTE — Telephone Encounter (Signed)
 Spoke with patient daughter, on [place] noted in Feb. 2025 and ED notes, update on patient dismissal from practice. Patient daughter was unaware. Explained there was letter mailed and sent via MyChart. Patient has multiple missed appointments due to transportation changes. She stated she had been in the MyChart and not seen the letter but will review to get patient setup with new PCP. Appreciated call, no additional questions at this time.

## 2024-01-31 ENCOUNTER — Telehealth

## 2024-01-31 ENCOUNTER — Ambulatory Visit: Admitting: Family Medicine

## 2024-01-31 ENCOUNTER — Other Ambulatory Visit: Payer: Self-pay | Admitting: Medical Genetics

## 2024-01-31 DIAGNOSIS — L0201 Cutaneous abscess of face: Secondary | ICD-10-CM

## 2024-01-31 DIAGNOSIS — R0981 Nasal congestion: Secondary | ICD-10-CM | POA: Diagnosis not present

## 2024-01-31 NOTE — Progress Notes (Signed)
 The ASCVD Risk score (Arnett DK, et al., 2019) failed to calculate for the following reasons:   Risk score cannot be calculated because patient has a medical history suggesting prior/existing ASCVD  Arlon Bergamo, BSN, RN

## 2024-01-31 NOTE — Patient Instructions (Signed)
 Skin Abscess  A skin abscess is an infected area on or under your skin. It contains pus and other material. An abscess may also be called a furuncle, carbuncle, or boil. It is often the result of an infection caused by bacteria. An abscess can occur in or on almost any part of your body. Sometimes, an abscess may break open (rupture) on its own. In most cases, it will keep getting worse unless it is treated. An abscess can cause pain and make you feel ill. An untreated abscess can cause infection to spread to other parts of your body or your bloodstream. The abscess may need to be drained. You may also need to take antibiotics. What are the causes? An abscess occurs when germs, like bacteria, pass through your skin and cause an infection. This may be caused by: A scrape or cut on your skin. A puncture wound through your skin, such as a needle injection or insect bite. Blocked oil or sweat glands. Blocked and infected hair follicles. A fluid-filled sac that forms beneath your skin (sebaceous cyst) and becomes infected. What increases the risk? You may be more likely to develop an abscess if: You have problems with blood circulation, or you have a weak body defense system (immune system). You have diabetes. You have dry and irritated skin. You get injections often or use IV drugs. You have a foreign body in a wound, such as a splinter. You smoke or use tobacco products. What are the signs or symptoms? Symptoms of this condition include: A painful, firm bump under the skin. A bump with pus at the top. This may break through the skin and drain. Other symptoms include: Redness and swelling around the abscess. Warmth or tenderness. Swelling of the lymph nodes (glands) near the abscess. A sore on the skin. How is this diagnosed? This condition may be diagnosed based on a physical exam and your medical history. You may also have tests done, such as: A test of a sample of pus. This may be done  to find what is causing the infection. Blood tests. Imaging tests, such as an ultrasound, CT scan, or MRI. How is this treated? A small abscess that drains on its own may not need to be treated. Treatment for larger abscesses may include: Moist heat or a heat pack applied to the area a few times a day. Incision and drainage. This is a procedure to drain the abscess. Antibiotics. For a severe abscess, you may first get antibiotics through an IV and then change to antibiotics by mouth. Follow these instructions at home: Medicines Take over-the-counter and prescription medicines only as told by your provider. If you were prescribed antibiotics, take them as told by your provider. Do not stop using the antibiotic even if you start to feel better. Abscess care  If you have an abscess that has not drained, apply heat to the affected area. Use the heat source that your provider recommends, such as a moist heat pack or a heating pad. Place a towel between your skin and the heat source. Leave the heat on for 20-30 minutes at a time. If your skin turns bright red, remove the heat right away to prevent burns. The risk of burns is higher if you cannot feel pain, heat, or cold. Follow instructions from your provider about how to take care of your abscess. Make sure you: Cover the abscess with a bandage (dressing). Wash your hands with soap and water for at least 20 seconds before  and after you change the dressing or gauze. If soap and water are not available, use hand sanitizer. Change your dressing or gauze as told by your provider. Check your abscess every day for signs of an infection that is getting worse. Check for: More redness, swelling, pain, or tenderness. More fluid or blood. Warmth. More pus or a worse smell. General instructions To avoid spreading the infection: Do not share personal care items, towels, or hot tubs with others. Avoid making skin contact with other people. Be careful  when getting rid of used dressings, wound packing, or any drainage from the abscess. Do not use any products that contain nicotine or tobacco. These products include cigarettes, chewing tobacco, and vaping devices, such as e-cigarettes. If you need help quitting, ask your provider. Do not use any creams, ointments, or liquids unless you have been told to by your provider. Contact a health care provider if: You see redness that spreads quickly or red streaks on your skin spreading away from the abscess. You have any signs of worse infection at the abscess. You vomit every time you eat or drink. You have a fever, chills, or muscle aches. The cyst or abscess returns. Get help right away if: You have severe pain. You make less pee (urine) than normal. This information is not intended to replace advice given to you by your health care provider. Make sure you discuss any questions you have with your health care provider. Document Revised: 04/04/2022 Document Reviewed: 04/04/2022 Elsevier Patient Education  2024 [employer].

## 2024-01-31 NOTE — Progress Notes (Signed)
 Virtual Visit Consent   Susan [name], you are scheduled for a virtual visit with a [facility] provider today. Just as with appointments in the office, your consent must be obtained to participate. Your consent will be active for this visit and any virtual visit you may have with one of our providers in the next 365 days. If you have a MyChart account, a copy of this consent can be sent to you electronically.  As this is a virtual visit, video technology does not allow for your provider to perform a traditional examination. This may limit your provider's ability to fully assess your condition. If your provider identifies any concerns that need to be evaluated in person or the need to arrange testing (such as labs, EKG, etc.), we will make arrangements to do so. Although advances in technology are sophisticated, we cannot ensure that it will always work on either your end or our end. If the connection with a video visit is poor, the visit may have to be switched to a telephone visit. With either a video or telephone visit, we are not always able to ensure that we have a secure connection.  By engaging in this virtual visit, you consent to the provision of healthcare and authorize for your insurance to be billed (if applicable) for the services provided during this visit. Depending on your insurance coverage, you may receive a charge related to this service.  I need to obtain your verbal consent now. Are you willing to proceed with your visit today? Susan [name] has provided verbal consent on 01/31/2024 for a virtual visit (video or telephone). Susan Huger, FNP  Date: 01/31/2024 2:42 PM   Virtual Visit via Video Note   I, Susan [name], connected with  Susan [name]  ([id number], Jun 05, 1961) on 01/31/24 at  2:30 PM EDT by a video-enabled telemedicine application and verified that I am speaking with the correct person using two identifiers.  Location: Patient: Virtual Visit Location Patient:  Home Provider: Virtual Visit Location Provider: Home Office   I discussed the limitations of evaluation and management by telemedicine and the availability of in person appointments. The patient expressed understanding and agreed to proceed.    History of Present Illness: Susan [name] is a 63 y.o. who identifies as a female who was assigned female at birth, and is being seen today for discharge from PCP. She has enough meds to .last through June. They are sending her a list of PCP's to call. She has a pimple on left side of face that popped and looks better today. She also started with a runny nose but daughter is with her ad will start claritin  and flonase .   HPI: HPI  Problems:  Patient Active Problem List   Diagnosis Date Noted   Acute hypoxemic respiratory failure (HCC) 10/19/2023   Abrasion, nose w/o infection 07/24/2022   Screening examination for STD (sexually transmitted disease) 07/04/2021   PRES (posterior reversible encephalopathy syndrome) 07/31/2020   Palliative care encounter    Metabolic encephalopathy 07/22/2020   Altered mental status 07/20/2020   Encephalopathy 04/02/2020   Muscle spasm 04/01/2020   Palliative care by specialist    Adult failure to thrive    Hx of completed stroke 11/24/2019   Acute kidney injury (AKI) with acute tubular necrosis (ATN) (HCC)    Aortic valve endocarditis 11/14/2019   Recurrent falls 10/15/2019   Constipation 02/26/2019   Schizophrenia (HCC) 02/18/2019   AKI (acute kidney injury) (HCC) 02/17/2019  Chronic respiratory failure (HCC) 02/16/2019   Right hip pain 02/21/2018   Medication monitoring encounter 11/27/2017   DNR (do not resuscitate) discussion 08/23/2017   Weakness generalized 06/24/2017   Osteoarthritis 07/14/2016   Abnormal hemoglobin (HCC) 04/18/2016   Hot flashes 10/16/2015   Human immunodeficiency virus (HIV) disease (HCC) 10/05/2015   PTSD (post-traumatic stress disorder) 05/12/2015   MDD (major depressive  disorder), recurrent episode, moderate (HCC) 05/11/2015   Cannabis use disorder, severe, dependence (HCC) 05/11/2015   Tobacco use disorder 05/11/2015   Uncontrolled type 2 diabetes mellitus with hyperglycemia, with long-term current use of insulin  (HCC) 04/18/2015   Diabetes mellitus type 2 without retinopathy (HCC) 01/28/2015   Hereditary and idiopathic peripheral neuropathy 06/03/2013   Dysfunctional uterine bleeding 06/04/2012   Dyslipidemia    GERD (gastroesophageal reflux disease)    Schizoaffective disorder, bipolar type (HCC) 04/26/2011   Morbid obesity (HCC) 04/26/2011   HIV disease (HCC) 03/28/2011   Essential hypertension 03/28/2011    Allergies:  Allergies  Allergen Reactions   Lyrica  [Pregabalin ] Swelling    Has LE swelling   Medications:  Current Outpatient Medications:    Accu-Chek Softclix Lancets lancets, Use as instructed, Disp: 100 each, Rfl: 12   acetaminophen  (TYLENOL ) 325 MG tablet, Take 650 mg by mouth at bedtime as needed for fever or moderate pain (pain score 4-6)., Disp: , Rfl:    albuterol  (PROVENTIL ) (2.5 MG/3ML) 0.083% nebulizer solution, Take 3 mLs (2.5 mg total) by nebulization every 6 (six) hours as needed for wheezing or shortness of breath., Disp: 150 mL, Rfl: 1   amLODipine  (NORVASC ) 10 MG tablet, TAKE 1 TABLET(10 MG) BY MOUTH DAILY, Disp: 90 tablet, Rfl: 0   Blood Glucose Monitoring Suppl DEVI, 1 each by Does not apply route in the morning, at noon, and at bedtime. May substitute to any manufacturer covered by patient's insurance., Disp: 1 each, Rfl: 0   Cholecalciferol (VITAMIN D ) 50 MCG (2000 UT) tablet, Take 2,000 Units by mouth daily., Disp: , Rfl:    cloNIDine  (CATAPRES ) 0.2 MG tablet, Take 1 tablet (0.2 mg total) by mouth 2 (two) times daily., Disp: 180 tablet, Rfl: 1   divalproex  (DEPAKOTE ) 500 MG DR tablet, TAKE 1 TABLET(500 MG) BY MOUTH THREE TIMES DAILY, Disp: 180 tablet, Rfl: 1   gabapentin  (NEURONTIN ) 300 MG capsule, TAKE 1 CAPSULE(300 MG)  BY MOUTH THREE TIMES DAILY, Disp: 270 capsule, Rfl: 1   glucose blood (ACCU-CHEK GUIDE TEST) test strip, Use as instructed, Disp: 100 strip, Rfl: 12   insulin  glargine (LANTUS  SOLOSTAR) 100 UNIT/ML Solostar Pen, Inject 15 Units into the skin daily., Disp: 15 mL, Rfl: 1   Insulin  Pen Needle (PEN NEEDLES 5/16") 30G X 8 MM MISC, 1 each by Does not apply route daily., Disp: 100 each, Rfl: 1   Lacosamide  150 MG TABS, TAKE 1 TABLET(150 MG) BY MOUTH TWICE DAILY, Disp: 60 tablet, Rfl: 1   lactulose  (CHRONULAC ) 10 GM/15ML solution, Take 20 g by mouth 3 (three) times daily after meals., Disp: , Rfl:    levETIRAcetam  (KEPPRA ) 1000 MG tablet, Take 1 tablet (1,000 mg total) by mouth 2 (two) times daily., Disp: 60 tablet, Rfl: 1   LORazepam  (ATIVAN ) 0.5 MG tablet, TAKE 1 TABLET(0.5 MG) BY MOUTH EVERY 6 HOURS AS NEEDED FOR ANXIETY, Disp: 60 tablet, Rfl: 3   metFORMIN  (GLUCOPHAGE ) 500 MG tablet, Take 1 tablet (500 mg total) by mouth 2 (two) times daily., Disp: 180 tablet, Rfl: 1   nicotine  (NICODERM CQ  - DOSED IN MG/24  HOURS) 14 mg/24hr patch, Place 1 patch (14 mg total) onto the skin daily., Disp: 28 patch, Rfl: 1   omeprazole  (PRILOSEC) 40 MG capsule, Take 1 capsule (40 mg total) by mouth daily., Disp: 100 capsule, Rfl: 1   PHENobarbital  (LUMINAL) 64.8 MG tablet, Take 1 tablet (64.8 mg total) by mouth 2 (two) times daily. TAKE 1 TABLET(64.8 MG) BY MOUTH TWICE DAILY, Disp: 60 tablet, Rfl: 1   psyllium (METAMUCIL) 58.6 % powder, Take 1 packet by mouth daily., Disp: , Rfl:    rosuvastatin  (CRESTOR ) 20 MG tablet, Take 1 tablet (20 mg total) by mouth daily., Disp: 100 tablet, Rfl: 1   sertraline  (ZOLOFT ) 100 MG tablet, TAKE 1 TABLET(100 MG) BY MOUTH AT BEDTIME, Disp: 90 tablet, Rfl: 0   TIVICAY  50 MG tablet, TAKE 1 TABLET(50 MG) BY MOUTH AT BEDTIME, Disp: 90 tablet, Rfl: 1   triamterene -hydrochlorothiazide  (MAXZIDE -25) 37.5-25 MG tablet, Take 1 tablet by mouth daily., Disp: 90 tablet, Rfl: 1   TRIUMEQ  600-50-300 MG  tablet, TAKE 1 TABLET BY MOUTH IN THE MORNING, Disp: 90 tablet, Rfl: 1  Observations/Objective: Patient is well-developed, well-nourished in no acute distress.  Resting comfortably  at home.  Head is normocephalic, atraumatic.  No labored breathing.  Speech is clear and coherent with logical content.  Patient is alert and oriented at baseline.    Assessment and Plan: 1. Facial abscess (Primary)  2. Head congestion  Continue present plans. Get apptmt with new pcp, go to UC if facial abscess or runny nose worsens. Daughter in agreement.   Follow Up Instructions: I discussed the assessment and treatment plan with the patient. The patient was provided an opportunity to ask questions and all were answered. The patient agreed with the plan and demonstrated an understanding of the instructions.  A copy of instructions were sent to the patient via MyChart unless otherwise noted below.     The patient was advised to call back or seek an in-person evaluation if the symptoms worsen or if the condition fails to improve as anticipated.    Chaye Misch, FNP

## 2024-02-06 ENCOUNTER — Encounter (HOSPITAL_COMMUNITY): Payer: Self-pay | Admitting: *Deleted

## 2024-02-06 ENCOUNTER — Other Ambulatory Visit: Payer: Self-pay

## 2024-02-06 ENCOUNTER — Emergency Department (HOSPITAL_COMMUNITY)

## 2024-02-06 ENCOUNTER — Emergency Department (HOSPITAL_COMMUNITY): Admission: EM | Admit: 2024-02-06 | Discharge: 2024-02-06 | Disposition: A | Discharge status: Home / Self Care

## 2024-02-06 DIAGNOSIS — E041 Nontoxic single thyroid nodule: Secondary | ICD-10-CM | POA: Diagnosis not present

## 2024-02-06 DIAGNOSIS — I10 Essential (primary) hypertension: Secondary | ICD-10-CM | POA: Diagnosis not present

## 2024-02-06 DIAGNOSIS — Z743 Need for continuous supervision: Secondary | ICD-10-CM | POA: Diagnosis not present

## 2024-02-06 DIAGNOSIS — Z7984 Long term (current) use of oral hypoglycemic drugs: Secondary | ICD-10-CM | POA: Diagnosis not present

## 2024-02-06 DIAGNOSIS — Z7401 Bed confinement status: Secondary | ICD-10-CM | POA: Diagnosis not present

## 2024-02-06 DIAGNOSIS — R404 Transient alteration of awareness: Secondary | ICD-10-CM | POA: Diagnosis not present

## 2024-02-06 DIAGNOSIS — Z043 Encounter for examination and observation following other accident: Secondary | ICD-10-CM | POA: Diagnosis not present

## 2024-02-06 DIAGNOSIS — Z794 Long term (current) use of insulin: Secondary | ICD-10-CM | POA: Diagnosis not present

## 2024-02-06 DIAGNOSIS — R6889 Other general symptoms and signs: Secondary | ICD-10-CM | POA: Diagnosis not present

## 2024-02-06 DIAGNOSIS — R410 Disorientation, unspecified: Secondary | ICD-10-CM | POA: Diagnosis not present

## 2024-02-06 DIAGNOSIS — R4182 Altered mental status, unspecified: Secondary | ICD-10-CM | POA: Diagnosis present

## 2024-02-06 DIAGNOSIS — W19XXXA Unspecified fall, initial encounter: Secondary | ICD-10-CM | POA: Diagnosis not present

## 2024-02-06 DIAGNOSIS — J45909 Unspecified asthma, uncomplicated: Secondary | ICD-10-CM | POA: Insufficient documentation

## 2024-02-06 DIAGNOSIS — E119 Type 2 diabetes mellitus without complications: Secondary | ICD-10-CM | POA: Diagnosis not present

## 2024-02-06 DIAGNOSIS — R531 Weakness: Secondary | ICD-10-CM | POA: Diagnosis not present

## 2024-02-06 DIAGNOSIS — R569 Unspecified convulsions: Secondary | ICD-10-CM | POA: Diagnosis not present

## 2024-02-06 DIAGNOSIS — G9389 Other specified disorders of brain: Secondary | ICD-10-CM | POA: Diagnosis not present

## 2024-02-06 DIAGNOSIS — Z79899 Other long term (current) drug therapy: Secondary | ICD-10-CM | POA: Insufficient documentation

## 2024-02-06 DIAGNOSIS — I517 Cardiomegaly: Secondary | ICD-10-CM | POA: Diagnosis not present

## 2024-02-06 DIAGNOSIS — Z21 Asymptomatic human immunodeficiency virus [HIV] infection status: Secondary | ICD-10-CM | POA: Insufficient documentation

## 2024-02-06 DIAGNOSIS — M5030 Other cervical disc degeneration, unspecified cervical region: Secondary | ICD-10-CM | POA: Diagnosis not present

## 2024-02-06 DIAGNOSIS — M4802 Spinal stenosis, cervical region: Secondary | ICD-10-CM | POA: Diagnosis not present

## 2024-02-06 LAB — COMPREHENSIVE METABOLIC PANEL WITH GFR
ALT: 13 U/L (ref 0–44)
AST: 19 U/L (ref 15–41)
Albumin: 3.1 g/dL — ABNORMAL LOW (ref 3.5–5.0)
Alkaline Phosphatase: 129 U/L — ABNORMAL HIGH (ref 38–126)
Anion gap: 12 (ref 5–15)
BUN: 14 mg/dL (ref 8–23)
CO2: 27 mmol/L (ref 22–32)
Calcium: 9.2 mg/dL (ref 8.9–10.3)
Chloride: 103 mmol/L (ref 98–111)
Creatinine, Ser: 0.87 mg/dL (ref 0.44–1.00)
GFR, Estimated: >60 mL/min (ref >60)
Glucose, Bld: 123 mg/dL — ABNORMAL HIGH (ref 70–99)
Potassium: 4.2 mmol/L (ref 3.5–5.1)
Sodium: 142 mmol/L (ref 135–145)
Total Bilirubin: 0.2 mg/dL (ref 0.0–1.2)
Total Protein: 6.6 g/dL (ref 6.5–8.1)

## 2024-02-06 LAB — RAPID URINE DRUG SCREEN, HOSP PERFORMED
Amphetamines: NOT DETECTED
Barbiturates: POSITIVE — AB
Benzodiazepines: NOT DETECTED
Cocaine: NOT DETECTED
Opiates: NOT DETECTED
Tetrahydrocannabinol: POSITIVE — AB

## 2024-02-06 LAB — URINALYSIS, MICROSCOPIC (REFLEX): Bacteria, UA: NONE SEEN

## 2024-02-06 LAB — URINALYSIS, ROUTINE W REFLEX MICROSCOPIC
Bilirubin Urine: NEGATIVE
Glucose, UA: NEGATIVE mg/dL
Ketones, ur: NEGATIVE mg/dL
Leukocytes,Ua: NEGATIVE
Nitrite: NEGATIVE
Protein, ur: NEGATIVE mg/dL
Specific Gravity, Urine: 1.025 (ref 1.005–1.030)
pH: 6 (ref 5.0–8.0)

## 2024-02-06 LAB — CBC
HCT: 45.3 % (ref 36.0–46.0)
Hemoglobin: 15 g/dL (ref 12.0–15.0)
MCH: 33.4 pg (ref 26.0–34.0)
MCHC: 33.1 g/dL (ref 30.0–36.0)
MCV: 100.9 fL — ABNORMAL HIGH (ref 80.0–100.0)
Platelets: 191 10*3/uL (ref 150–400)
RBC: 4.49 MIL/uL (ref 3.87–5.11)
RDW: 15 % (ref 11.5–15.5)
WBC: 6.5 10*3/uL (ref 4.0–10.5)
nRBC: 0 % (ref 0.0–0.2)

## 2024-02-06 LAB — CBG MONITORING, ED
Glucose-Capillary: 138 mg/dL — ABNORMAL HIGH (ref 70–99)
Glucose-Capillary: 73 mg/dL (ref 70–99)

## 2024-02-06 LAB — ETHANOL: Alcohol, Ethyl (B): <15 mg/dL (ref <15)

## 2024-02-06 LAB — ACETAMINOPHEN LEVEL: Acetaminophen (Tylenol), Serum: <10 ug/mL — ABNORMAL LOW (ref 10–30)

## 2024-02-06 LAB — SALICYLATE LEVEL: Salicylate Lvl: <7 mg/dL — ABNORMAL LOW (ref 7.0–30.0)

## 2024-02-06 NOTE — ED Triage Notes (Signed)
 Pt arrived from home after family heard a thud. Pt reports she was dreaming that she needed to go to the bathroom and believes she fell out of bed. C-collar placed for precautions. Pt A&Ox3, c/o feeling tired. Hx of seizures, daughter reported to EMS that pt appeared postical
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
## 2024-02-06 NOTE — ED Provider Triage Note (Signed)
  Emergency Medicine Provider Triage Evaluation Note  MRN:  [id number]  Arrival date & time: 02/06/24    Medically screening exam initiated at 6:07 AM.   CC:   AMS  HPI:  Susan [name] is a 63 y.o. year-old female presents to the ED with chief complaint of fall from bed.  There is also some question about her having taken more of her medications than normal.  She also has history of seizure disorder, and family reported to EMS that this seems a bit like postictal phase for the patient.  Patient denies any pain.  She denies any recent illnesses..  History provided by patient and  EMS. ROS:  -As included in HPI PE:   Vitals:   02/06/24 0556  BP: (!) 127/59  Pulse: 76  Resp: 18  Temp: 97.7 F (36.5 C)  SpO2: 100%    Non-toxic appearing No respiratory distress  MDM:    Patient was informed that the remainder of the evaluation will be completed by another provider, this initial triage assessment does not replace that evaluation, and the importance of remaining in the ED until their evaluation is complete.    Sherel Dikes, PA-C 02/06/24 [phone]

## 2024-02-06 NOTE — ED Notes (Signed)
 Pt taken to CT.

## 2024-02-06 NOTE — ED Notes (Signed)
 Patient has C-collar in place at this time.

## 2024-02-06 NOTE — Discharge Instructions (Signed)
 As we discussed, your workup in the ER today was reassuring for acute findings.  Laboratory evaluation and CT imaging did not reveal any emergent cause of your symptoms.  I recommend that you follow-up closely with your neurologist as well as your PCP.  Incidentally, the CT scan of your neck did pick up a nodule in the left side of your thyroid .  Did they recommend that you have an ultrasound to further evaluate this.  Please schedule this through your PCP.  Return if development of any new or worsening symptoms.

## 2024-02-06 NOTE — ED Notes (Signed)
 Called [facility] ETA 2hours

## 2024-02-11 ENCOUNTER — Telehealth: Payer: Self-pay | Admitting: Family Medicine

## 2024-02-11 NOTE — Telephone Encounter (Signed)
 Copied from CRM [id number]. Topic: Clinical - Medication Question >> Feb 11, 2024  2:55 PM Baldo Levan wrote: Reason for CRM: Patient daughter Patricia Boon called in stating that her mother is out of all of her medications. The daughter could not provide the names of the medications but stated that Trevor Fudge would know the medications but it is all of them, and that was all she was able to provide. Please contact the daughter with any questions.  The call was dropped after she provided this information. >> Feb 11, 2024  3:43 PM Kita Perish H wrote: Patients daughter Arliss Lam calling following up on previous messages sent regarding her mom needing refills. Reached out to [facility] and was advised to inform daughter that patient is dismissed from clinic so no refills can be given. Patients daughter upset stating that her mom needs her medications and wants to speak with office, states letter she received advises they have 30 days to have refills done by provider and she needs her 2 seizure medicines, states she will call back everyday until she gets the refills.  Watt Hackney [phone]  ---   This message has been saved for record keeping

## 2024-02-12 DIAGNOSIS — R296 Repeated falls: Secondary | ICD-10-CM | POA: Diagnosis not present

## 2024-02-12 DIAGNOSIS — E1110 Type 2 diabetes mellitus with ketoacidosis without coma: Secondary | ICD-10-CM | POA: Diagnosis not present

## 2024-02-12 DIAGNOSIS — J45901 Unspecified asthma with (acute) exacerbation: Secondary | ICD-10-CM | POA: Diagnosis not present

## 2024-02-13 ENCOUNTER — Other Ambulatory Visit: Payer: Self-pay | Admitting: Family Medicine

## 2024-02-13 ENCOUNTER — Telehealth: Admitting: Physician Assistant

## 2024-02-13 DIAGNOSIS — F250 Schizoaffective disorder, bipolar type: Secondary | ICD-10-CM

## 2024-02-13 DIAGNOSIS — K219 Gastro-esophageal reflux disease without esophagitis: Secondary | ICD-10-CM | POA: Diagnosis not present

## 2024-02-13 DIAGNOSIS — E119 Type 2 diabetes mellitus without complications: Secondary | ICD-10-CM

## 2024-02-13 MED ORDER — OMEPRAZOLE 40 MG PO CPDR
40.0000 mg | DELAYED_RELEASE_CAPSULE | Freq: Every day | ORAL | 0 refills | Status: AC
Start: 2024-02-13 — End: ?

## 2024-02-13 MED ORDER — LEVETIRACETAM 1000 MG PO TABS
1000.0000 mg | ORAL_TABLET | Freq: Two times a day (BID) | ORAL | 0 refills | Status: AC
Start: 2024-02-13 — End: ?

## 2024-02-13 MED ORDER — ACCU-CHEK GUIDE TEST VI STRP
ORAL_STRIP | 1 refills | Status: AC
Start: 2024-02-13 — End: ?

## 2024-02-13 NOTE — Progress Notes (Signed)
 Virtual Visit Consent   Susan [name], you are scheduled for a virtual visit with a [facility] provider today. Just as with appointments in the office, your consent must be obtained to participate. Your consent will be active for this visit and any virtual visit you may have with one of our providers in the next 365 days. If you have a MyChart account, a copy of this consent can be sent to you electronically.  As this is a virtual visit, video technology does not allow for your provider to perform a traditional examination. This may limit your provider's ability to fully assess your condition. If your provider identifies any concerns that need to be evaluated in person or the need to arrange testing (such as labs, EKG, etc.), we will make arrangements to do so. Although advances in technology are sophisticated, we cannot ensure that it will always work on either your end or our end. If the connection with a video visit is poor, the visit may have to be switched to a telephone visit. With either a video or telephone visit, we are not always able to ensure that we have a secure connection.  By engaging in this virtual visit, you consent to the provision of healthcare and authorize for your insurance to be billed (if applicable) for the services provided during this visit. Depending on your insurance coverage, you may receive a charge related to this service.  I need to obtain your verbal consent now. Are you willing to proceed with your visit today? Susan [name] has provided verbal consent on 02/13/2024 for a virtual visit (video or telephone). Hyla Maillard, [place]  Date: 02/13/2024 4:12 PM   Virtual Visit via Video Note   I, Hyla Maillard, connected with  Susan [name]  ([id number], [birth year]) on 02/13/24 at  4:00 PM EDT by a video-enabled telemedicine application and verified that I am speaking with the correct person using two identifiers.  Location: Patient: Virtual Visit Location  Patient: Home Provider: Virtual Visit Location Provider: Home Office   I discussed the limitations of evaluation and management by telemedicine and the availability of in person appointments. The patient expressed understanding and agreed to proceed.    History of Present Illness: Susan [name] is a 63 y.o. who identifies as a female who was assigned female at birth, and is being seen today for medication refill request.   Patient recently dismissed by primary care office due to missed appointments and unable to get in with anew primary care provider before running out of medications. Is requesting refill of glucose testing strips, omeprazole  and Keppra . Also requesting refill of her phenobarbital  and Lacosamide . Referrals in place for specialists. Awaiting appointments.      HPI: HPI  Problems:  Patient Active Problem List   Diagnosis Date Noted   Acute hypoxemic respiratory failure (HCC) 10/19/2023   Abrasion, nose w/o infection 07/24/2022   Screening examination for STD (sexually transmitted disease) 07/04/2021   PRES (posterior reversible encephalopathy syndrome) 07/31/2020   Palliative care encounter    Metabolic encephalopathy 07/22/2020   Altered mental status 07/20/2020   Encephalopathy 04/02/2020   Muscle spasm 04/01/2020   Palliative care by specialist    Adult failure to thrive    Hx of completed stroke 11/24/2019   Acute kidney injury (AKI) with acute tubular necrosis (ATN) (HCC)    Aortic valve endocarditis 11/14/2019   Recurrent falls 10/15/2019   Constipation 02/26/2019   Schizophrenia (HCC) 02/18/2019   AKI (  acute kidney injury) (HCC) 02/17/2019   Chronic respiratory failure (HCC) 02/16/2019   Right hip pain 02/21/2018   Medication monitoring encounter 11/27/2017   DNR (do not resuscitate) discussion 08/23/2017   Weakness generalized 06/24/2017   Osteoarthritis 07/14/2016   Abnormal hemoglobin (HCC) 04/18/2016   Hot flashes 10/16/2015   Human  immunodeficiency virus (HIV) disease (HCC) 10/05/2015   PTSD (post-traumatic stress disorder) 05/12/2015   MDD (major depressive disorder), recurrent episode, moderate (HCC) 05/11/2015   Cannabis use disorder, severe, dependence (HCC) 05/11/2015   Tobacco use disorder 05/11/2015   Uncontrolled type 2 diabetes mellitus with hyperglycemia, with long-term current use of insulin  (HCC) 04/18/2015   Diabetes mellitus type 2 without retinopathy (HCC) 01/28/2015   Hereditary and idiopathic peripheral neuropathy 06/03/2013   Dysfunctional uterine bleeding 06/04/2012   Dyslipidemia    GERD (gastroesophageal reflux disease)    Schizoaffective disorder, bipolar type (HCC) 04/26/2011   Morbid obesity (HCC) 04/26/2011   HIV disease (HCC) 03/28/2011   Essential hypertension 03/28/2011    Allergies:  Allergies  Allergen Reactions   Lyrica  [Pregabalin ] Swelling    Has LE swelling   Medications:  Current Outpatient Medications:    risperiDONE  (RISPERDAL ) 2 MG tablet, Take 2 mg by mouth at bedtime., Disp: , Rfl:    Accu-Chek Softclix Lancets lancets, Use as instructed, Disp: 100 each, Rfl: 12   acetaminophen  (TYLENOL ) 325 MG tablet, Take 650 mg by mouth at bedtime as needed for fever or moderate pain (pain score 4-6)., Disp: , Rfl:    albuterol  (PROVENTIL ) (2.5 MG/3ML) 0.083% nebulizer solution, Take 3 mLs (2.5 mg total) by nebulization every 6 (six) hours as needed for wheezing or shortness of breath., Disp: 150 mL, Rfl: 1   amLODipine  (NORVASC ) 10 MG tablet, TAKE 1 TABLET(10 MG) BY MOUTH DAILY, Disp: 90 tablet, Rfl: 0   Blood Glucose Monitoring Suppl DEVI, 1 each by Does not apply route in the morning, at noon, and at bedtime. May substitute to any manufacturer covered by patient's insurance., Disp: 1 each, Rfl: 0   Cholecalciferol (VITAMIN D ) 50 MCG (2000 UT) tablet, Take 2,000 Units by mouth daily., Disp: , Rfl:    cloNIDine  (CATAPRES ) 0.2 MG tablet, Take 1 tablet (0.2 mg total) by mouth 2 (two)  times daily., Disp: 180 tablet, Rfl: 1   divalproex  (DEPAKOTE ) 500 MG DR tablet, TAKE 1 TABLET(500 MG) BY MOUTH THREE TIMES DAILY, Disp: 180 tablet, Rfl: 1   gabapentin  (NEURONTIN ) 300 MG capsule, TAKE 1 CAPSULE(300 MG) BY MOUTH THREE TIMES DAILY, Disp: 270 capsule, Rfl: 1   glucose blood (ACCU-CHEK GUIDE TEST) test strip, Use as instructed, Disp: 100 strip, Rfl: 1   insulin  glargine (LANTUS  SOLOSTAR) 100 UNIT/ML Solostar Pen, Inject 15 Units into the skin daily., Disp: 15 mL, Rfl: 1   Insulin  Pen Needle (PEN NEEDLES 5/16) 30G X 8 MM MISC, 1 each by Does not apply route daily., Disp: 100 each, Rfl: 1   Lacosamide  150 MG TABS, TAKE 1 TABLET(150 MG) BY MOUTH TWICE DAILY, Disp: 60 tablet, Rfl: 1   lactulose  (CHRONULAC ) 10 GM/15ML solution, Take 20 g by mouth 3 (three) times daily after meals., Disp: , Rfl:    levETIRAcetam  (KEPPRA ) 1000 MG tablet, Take 1 tablet (1,000 mg total) by mouth 2 (two) times daily., Disp: 60 tablet, Rfl: 0   LORazepam  (ATIVAN ) 0.5 MG tablet, TAKE 1 TABLET(0.5 MG) BY MOUTH EVERY 6 HOURS AS NEEDED FOR ANXIETY, Disp: 60 tablet, Rfl: 3   metFORMIN  (GLUCOPHAGE ) 500 MG tablet, Take  1 tablet (500 mg total) by mouth 2 (two) times daily., Disp: 180 tablet, Rfl: 1   nicotine  (NICODERM CQ  - DOSED IN MG/24 HOURS) 14 mg/24hr patch, Place 1 patch (14 mg total) onto the skin daily., Disp: 28 patch, Rfl: 1   omeprazole  (PRILOSEC) 40 MG capsule, Take 1 capsule (40 mg total) by mouth daily., Disp: 90 capsule, Rfl: 0   PHENobarbital  (LUMINAL) 64.8 MG tablet, Take 1 tablet (64.8 mg total) by mouth 2 (two) times daily. TAKE 1 TABLET(64.8 MG) BY MOUTH TWICE DAILY, Disp: 60 tablet, Rfl: 1   psyllium (METAMUCIL) 58.6 % powder, Take 1 packet by mouth daily., Disp: , Rfl:    rosuvastatin  (CRESTOR ) 20 MG tablet, Take 1 tablet (20 mg total) by mouth daily., Disp: 100 tablet, Rfl: 1   sertraline  (ZOLOFT ) 100 MG tablet, TAKE 1 TABLET(100 MG) BY MOUTH AT BEDTIME, Disp: 90 tablet, Rfl: 0   TIVICAY  50 MG  tablet, TAKE 1 TABLET(50 MG) BY MOUTH AT BEDTIME, Disp: 90 tablet, Rfl: 1   triamterene -hydrochlorothiazide  (MAXZIDE -25) 37.5-25 MG tablet, Take 1 tablet by mouth daily., Disp: 90 tablet, Rfl: 1   TRIUMEQ  600-50-300 MG tablet, TAKE 1 TABLET BY MOUTH IN THE MORNING, Disp: 90 tablet, Rfl: 1  Observations/Objective: Patient is well-developed, well-nourished in no acute distress.  Resting comfortably  at home.  Head is normocephalic, atraumatic.  No labored breathing.  Speech is clear and coherent with logical content.  Patient is alert and oriented at baseline.   Assessment and Plan: 1. Diabetes mellitus type 2 without retinopathy (HCC) - glucose blood (ACCU-CHEK GUIDE TEST) test strip; Use as instructed  Dispense: 100 strip; Refill: 1  2. Schizoaffective disorder, bipolar type (HCC) - levETIRAcetam  (KEPPRA ) 1000 MG tablet; Take 1 tablet (1,000 mg total) by mouth 2 (two) times daily.  Dispense: 60 tablet; Refill: 0  3. Gastroesophageal reflux disease, unspecified whether esophagitis present - omeprazole  (PRILOSEC) 40 MG capsule; Take 1 capsule (40 mg total) by mouth daily.  Dispense: 90 capsule; Refill: 0  One-time refill given of the above medications after reviewing recent blood work. Phenobarbital  and Lacosamide  are controlled medications which we cannot refill through virtual urgent care. Resources for [facility] and other local UC for refills of other medicine, as well as ongoing management of her schizophrenia until she can get in with a new steady psychiatrist and PCP.  Follow Up Instructions: I discussed the assessment and treatment plan with the patient. The patient was provided an opportunity to ask questions and all were answered. The patient agreed with the plan and demonstrated an understanding of the instructions.  A copy of instructions were sent to the patient via MyChart unless otherwise noted below.   The patient was advised to call back or seek an in-person evaluation  if the symptoms worsen or if the condition fails to improve as anticipated.    Hyla Maillard, PA-C

## 2024-02-13 NOTE — Patient Instructions (Signed)
 Susan [name], thank you for joining Susan Maillard, PA-C for today's virtual visit.  While this provider is not your primary care provider (PCP), if your PCP is located in our provider database this encounter information will be shared with them immediately following your visit.   A [facility] MyChart account gives you access to today's visit and all your visits, tests, and labs performed at [facility]  click here if you don't have a [name] account or go to [email or web address]  Consent: (Patient) Susan [name] provided verbal consent for this virtual visit at the beginning of the encounter.  Current Medications:  Current Outpatient Medications:    risperiDONE  (RISPERDAL ) 2 MG tablet, Take 2 mg by mouth at bedtime., Disp: , Rfl:    Accu-Chek Softclix Lancets lancets, Use as instructed, Disp: 100 each, Rfl: 12   acetaminophen  (TYLENOL ) 325 MG tablet, Take 650 mg by mouth at bedtime as needed for fever or moderate pain (pain score 4-6)., Disp: , Rfl:    albuterol  (PROVENTIL ) (2.5 MG/3ML) 0.083% nebulizer solution, Take 3 mLs (2.5 mg total) by nebulization every 6 (six) hours as needed for wheezing or shortness of breath., Disp: 150 mL, Rfl: 1   amLODipine  (NORVASC ) 10 MG tablet, TAKE 1 TABLET(10 MG) BY MOUTH DAILY, Disp: 90 tablet, Rfl: 0   Blood Glucose Monitoring Suppl DEVI, 1 each by Does not apply route in the morning, at noon, and at bedtime. May substitute to any manufacturer covered by patient's insurance., Disp: 1 each, Rfl: 0   Cholecalciferol (VITAMIN D ) 50 MCG (2000 UT) tablet, Take 2,000 Units by mouth daily., Disp: , Rfl:    cloNIDine  (CATAPRES ) 0.2 MG tablet, Take 1 tablet (0.2 mg total) by mouth 2 (two) times daily., Disp: 180 tablet, Rfl: 1   divalproex  (DEPAKOTE ) 500 MG DR tablet, TAKE 1 TABLET(500 MG) BY MOUTH THREE TIMES DAILY, Disp: 180 tablet, Rfl: 1   gabapentin  (NEURONTIN ) 300 MG capsule, TAKE 1 CAPSULE(300 MG) BY MOUTH THREE TIMES  DAILY, Disp: 270 capsule, Rfl: 1   glucose blood (ACCU-CHEK GUIDE TEST) test strip, Use as instructed, Disp: 100 strip, Rfl: 1   insulin  glargine (LANTUS  SOLOSTAR) 100 UNIT/ML Solostar Pen, Inject 15 Units into the skin daily., Disp: 15 mL, Rfl: 1   Insulin  Pen Needle (PEN NEEDLES 5/16) 30G X 8 MM MISC, 1 each by Does not apply route daily., Disp: 100 each, Rfl: 1   Lacosamide  150 MG TABS, TAKE 1 TABLET(150 MG) BY MOUTH TWICE DAILY, Disp: 60 tablet, Rfl: 1   lactulose  (CHRONULAC ) 10 GM/15ML solution, Take 20 g by mouth 3 (three) times daily after meals., Disp: , Rfl:    levETIRAcetam  (KEPPRA ) 1000 MG tablet, Take 1 tablet (1,000 mg total) by mouth 2 (two) times daily., Disp: 60 tablet, Rfl: 0   LORazepam  (ATIVAN ) 0.5 MG tablet, TAKE 1 TABLET(0.5 MG) BY MOUTH EVERY 6 HOURS AS NEEDED FOR ANXIETY, Disp: 60 tablet, Rfl: 3   metFORMIN  (GLUCOPHAGE ) 500 MG tablet, Take 1 tablet (500 mg total) by mouth 2 (two) times daily., Disp: 180 tablet, Rfl: 1   nicotine  (NICODERM CQ  - DOSED IN MG/24 HOURS) 14 mg/24hr patch, Place 1 patch (14 mg total) onto the skin daily., Disp: 28 patch, Rfl: 1   omeprazole  (PRILOSEC) 40 MG capsule, Take 1 capsule (40 mg total) by mouth daily., Disp: 90 capsule, Rfl: 0   PHENobarbital  (LUMINAL) 64.8 MG tablet, Take 1 tablet (64.8 mg total) by mouth 2 (two) times daily. TAKE 1  TABLET(64.8 MG) BY MOUTH TWICE DAILY, Disp: 60 tablet, Rfl: 1   psyllium (METAMUCIL) 58.6 % powder, Take 1 packet by mouth daily., Disp: , Rfl:    rosuvastatin  (CRESTOR ) 20 MG tablet, Take 1 tablet (20 mg total) by mouth daily., Disp: 100 tablet, Rfl: 1   sertraline  (ZOLOFT ) 100 MG tablet, TAKE 1 TABLET(100 MG) BY MOUTH AT BEDTIME, Disp: 90 tablet, Rfl: 0   TIVICAY  50 MG tablet, TAKE 1 TABLET(50 MG) BY MOUTH AT BEDTIME, Disp: 90 tablet, Rfl: 1   triamterene -hydrochlorothiazide  (MAXZIDE -25) 37.5-25 MG tablet, Take 1 tablet by mouth daily., Disp: 90 tablet, Rfl: 1   TRIUMEQ  600-50-300 MG tablet, TAKE 1 TABLET BY  MOUTH IN THE MORNING, Disp: 90 tablet, Rfl: 1   Medications ordered in this encounter:  Meds ordered this encounter  Medications   glucose blood (ACCU-CHEK GUIDE TEST) test strip    Sig: Use as instructed    Dispense:  100 strip    Refill:  1    One time refill. Further refills to come from her PCP/specialists.    Supervising Provider:   LAMPTEY, PHILIP O [[id number]]   levETIRAcetam  (KEPPRA ) 1000 MG tablet    Sig: Take 1 tablet (1,000 mg total) by mouth 2 (two) times daily.    Dispense:  60 tablet    Refill:  0    One time refill. Further refills to come from her PCP/specialists.    Supervising Provider:   LAMPTEY, PHILIP O [[id number]]   omeprazole  (PRILOSEC) 40 MG capsule    Sig: Take 1 capsule (40 mg total) by mouth daily.    Dispense:  90 capsule    Refill:  0    One time refill. Further refills to come from her PCP/specialists.    Supervising Provider:   Corine Dice [[id number]]     *If you need refills on other medications prior to your next appointment, please contact your pharmacy*  Follow-Up: Call back or seek an in-person evaluation if the symptoms worsen or if the condition fails to improve as anticipated.  [facility] [phone]  Other Instructions I have sent in refills of your requested medications. The Phenobarbital  is a controlled substance which we cannot refill through virtual urgent care. Contact your PCP office again now that labs are in the system to see if they will send in one final refill for you.   Please reach out to our [facility] for management of psychiatric issues while waiting to get in with a routine specialist. [address]. [place], [place] [phone]   If you have been instructed to have an in-person evaluation today at a local Urgent Care facility, please use the link below. It will take you to a list of all of our available [facility], including address, phone number and hours of operation.  Please do not delay care.  [facility]  If you or a family member do not have a primary care provider, use the link below to schedule a visit and establish care. When you choose a [facility] primary care physician or advanced practice provider, you gain a long-term partner in health. Find a Primary Care Provider  Learn more about [facility]'s in-office and virtual care options: [facility] - Get Care Now

## 2024-02-13 NOTE — Telephone Encounter (Signed)
 Copied from CRM [id number]. Topic: Clinical - Medication Question >> Feb 11, 2024  2:55 PM Baldo Levan wrote: Reason for CRM: Patient daughter Patricia Boon called in stating that her mother is out of all of her medications. The daughter could not provide the names of the medications but stated that Trevor Fudge would know the medications but it is all of them, and that was all she was able to provide. Please contact the daughter with any questions.  The call was dropped after she provided this information. >> Feb 13, 2024  1:28 PM Allyne Areola wrote: Patient's daughter is calling to advise that the patient had labs done while in the ED, and she understands patient is discharged but needs a refill on her medication.  >> Feb 11, 2024  3:43 PM Kita Perish H wrote: Patients daughter Arliss Lam calling following up on previous messages sent regarding her mom needing refills. Reached out to [facility] and was advised to inform daughter that patient is dismissed from clinic so no refills can be given. Patients daughter upset stating that her mom needs her medications and wants to speak with office, states letter she received advises they have 30 days to have refills done by provider and she needs her 2 seizure medicines, states she will call back everyday until she gets the refills.  Watt Hackney [phone]

## 2024-02-14 ENCOUNTER — Other Ambulatory Visit: Payer: Self-pay | Admitting: Family Medicine

## 2024-02-14 DIAGNOSIS — J4520 Mild intermittent asthma, uncomplicated: Secondary | ICD-10-CM

## 2024-02-18 ENCOUNTER — Telehealth: Admitting: Physician Assistant

## 2024-02-18 ENCOUNTER — Encounter (HOSPITAL_COMMUNITY): Payer: Self-pay | Admitting: *Deleted

## 2024-02-18 ENCOUNTER — Telehealth

## 2024-02-18 ENCOUNTER — Emergency Department (HOSPITAL_COMMUNITY)

## 2024-02-18 ENCOUNTER — Other Ambulatory Visit: Payer: Self-pay

## 2024-02-18 ENCOUNTER — Emergency Department (HOSPITAL_COMMUNITY)
Admission: EM | Admit: 2024-02-18 | Discharge: 2024-02-19 | Disposition: A | Discharge status: Home / Self Care | Attending: Emergency Medicine | Admitting: Emergency Medicine

## 2024-02-18 DIAGNOSIS — Z7984 Long term (current) use of oral hypoglycemic drugs: Secondary | ICD-10-CM | POA: Insufficient documentation

## 2024-02-18 DIAGNOSIS — I10 Essential (primary) hypertension: Secondary | ICD-10-CM | POA: Insufficient documentation

## 2024-02-18 DIAGNOSIS — R3989 Other symptoms and signs involving the genitourinary system: Secondary | ICD-10-CM

## 2024-02-18 DIAGNOSIS — J45909 Unspecified asthma, uncomplicated: Secondary | ICD-10-CM | POA: Diagnosis not present

## 2024-02-18 DIAGNOSIS — R509 Fever, unspecified: Secondary | ICD-10-CM | POA: Diagnosis not present

## 2024-02-18 DIAGNOSIS — B379 Candidiasis, unspecified: Secondary | ICD-10-CM | POA: Diagnosis not present

## 2024-02-18 DIAGNOSIS — E119 Type 2 diabetes mellitus without complications: Secondary | ICD-10-CM | POA: Diagnosis not present

## 2024-02-18 DIAGNOSIS — N760 Acute vaginitis: Secondary | ICD-10-CM | POA: Diagnosis not present

## 2024-02-18 DIAGNOSIS — Z794 Long term (current) use of insulin: Secondary | ICD-10-CM | POA: Insufficient documentation

## 2024-02-18 DIAGNOSIS — Z79899 Other long term (current) drug therapy: Secondary | ICD-10-CM | POA: Insufficient documentation

## 2024-02-18 DIAGNOSIS — B9689 Other specified bacterial agents as the cause of diseases classified elsewhere: Secondary | ICD-10-CM

## 2024-02-18 DIAGNOSIS — R059 Cough, unspecified: Secondary | ICD-10-CM | POA: Diagnosis not present

## 2024-02-18 DIAGNOSIS — Z7951 Long term (current) use of inhaled steroids: Secondary | ICD-10-CM | POA: Diagnosis not present

## 2024-02-18 DIAGNOSIS — I700 Atherosclerosis of aorta: Secondary | ICD-10-CM | POA: Diagnosis not present

## 2024-02-18 DIAGNOSIS — Z21 Asymptomatic human immunodeficiency virus [HIV] infection status: Secondary | ICD-10-CM | POA: Insufficient documentation

## 2024-02-18 DIAGNOSIS — N764 Abscess of vulva: Secondary | ICD-10-CM

## 2024-02-18 MED ORDER — ZIPRASIDONE MESYLATE 20 MG IM SOLR
20.0000 mg | Freq: Once | INTRAMUSCULAR | Status: AC
Start: 2024-02-18 — End: 2024-02-18
  Administered 2024-02-18: 20 mg via INTRAMUSCULAR
  Filled 2024-02-18: qty 20

## 2024-02-18 MED ORDER — ACCU-CHEK GUIDE TEST VI STRP: ORAL_STRIP | 1 refills | Status: AC

## 2024-02-18 MED ORDER — CEPHALEXIN 500 MG PO CAPS: 500.0000 mg | ORAL_CAPSULE | Freq: Two times a day (BID) | ORAL | 0 refills | Status: AC

## 2024-02-18 MED ORDER — LEVETIRACETAM 1000 MG PO TABS: 1000.0000 mg | ORAL_TABLET | Freq: Two times a day (BID) | ORAL | 0 refills | Status: DC

## 2024-02-18 MED ORDER — OMEPRAZOLE 40 MG PO CPDR: 40.0000 mg | DELAYED_RELEASE_CAPSULE | Freq: Every day | ORAL | 0 refills | Status: DC

## 2024-02-18 NOTE — Addendum Note (Signed)
 Addended by: Farris Hong on: 02/18/2024 11:32 AM   Modules accepted: Orders

## 2024-02-18 NOTE — ED Notes (Signed)
 Pt taking vital signs equipment off and yelling at staff saying I want to go home. Call my daughter. Pt is confused, disoriented to self, situation and to time. PA [place] made aware.

## 2024-02-18 NOTE — ED Triage Notes (Signed)
 Arrived with [facility] from home (lives with daughter)  for fever given tylenol  ~4hrs ago, 1000mg ,   Boil to chin and vagina  HIV + Uses a walker at baseline, decreased mobility   176/96, pulse 80 18 96% RA 92CBg 97.5
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
## 2024-02-18 NOTE — Progress Notes (Signed)

## 2024-02-18 NOTE — Progress Notes (Signed)
  Because of need for examination and possible drainage of vaginal/labial abscess, I feel your condition warrants further evaluation and I recommend that you be seen in a face-to-face visit. Please use link below for our in-person urgent care locations so you can be taken care of ASAP today.   NOTE: There will be NO CHARGE for this E-Visit   If you are having a true medical emergency, please call 911.     For an urgent face to face visit, [name] has multiple urgent care centers for your convenience.  Click the link below for the full list of locations and hours, walk-in wait times, appointment scheduling options and driving directions:  Urgent Care - [place], [place], [place], [place], [place], [place]  [place]     Your MyChart E-visit questionnaire answers were reviewed by a board certified advanced clinical practitioner to complete your personal care plan based on your specific symptoms.    Thank you for using e-Visits.

## 2024-02-18 NOTE — Progress Notes (Signed)
 I have spent 5 minutes in review of e-visit questionnaire, review and updating patient chart, medical decision making and response to patient.   Piedad Climes, PA-C

## 2024-02-19 ENCOUNTER — Emergency Department (HOSPITAL_COMMUNITY)

## 2024-02-19 DIAGNOSIS — Z743 Need for continuous supervision: Secondary | ICD-10-CM | POA: Diagnosis not present

## 2024-02-19 DIAGNOSIS — I499 Cardiac arrhythmia, unspecified: Secondary | ICD-10-CM | POA: Diagnosis not present

## 2024-02-19 DIAGNOSIS — N760 Acute vaginitis: Secondary | ICD-10-CM | POA: Diagnosis not present

## 2024-02-19 DIAGNOSIS — R531 Weakness: Secondary | ICD-10-CM | POA: Diagnosis not present

## 2024-02-19 DIAGNOSIS — K8020 Calculus of gallbladder without cholecystitis without obstruction: Secondary | ICD-10-CM | POA: Diagnosis not present

## 2024-02-19 LAB — RESP PANEL BY RT-PCR (RSV, FLU A&B, COVID)  RVPGX2
Influenza A by PCR: NEGATIVE
Influenza B by PCR: NEGATIVE
Resp Syncytial Virus by PCR: NEGATIVE
SARS Coronavirus 2 by RT PCR: NEGATIVE

## 2024-02-19 LAB — I-STAT CG4 LACTIC ACID, ED
Lactic Acid, Venous: 2 mmol/L (ref 0.5–1.9)
Lactic Acid, Venous: 1 mmol/L (ref 0.5–1.9)

## 2024-02-19 LAB — CBC WITH DIFFERENTIAL/PLATELET
Abs Immature Granulocytes: 0.06 10*3/uL (ref 0.00–0.07)
Basophils Absolute: 0 10*3/uL (ref 0.0–0.1)
Basophils Relative: 1 %
Eosinophils Absolute: 0.2 10*3/uL (ref 0.0–0.5)
Eosinophils Relative: 3 %
HCT: 41.9 % (ref 36.0–46.0)
Hemoglobin: 13.7 g/dL (ref 12.0–15.0)
Immature Granulocytes: 1 %
Lymphocytes Relative: 40 %
Lymphs Abs: 2.7 10*3/uL (ref 0.7–4.0)
MCH: 33.2 pg (ref 26.0–34.0)
MCHC: 32.7 g/dL (ref 30.0–36.0)
MCV: 101.5 fL — ABNORMAL HIGH (ref 80.0–100.0)
Monocytes Absolute: 0.5 10*3/uL (ref 0.1–1.0)
Monocytes Relative: 7 %
Neutro Abs: 3.3 10*3/uL (ref 1.7–7.7)
Neutrophils Relative %: 48 %
Platelets: 181 10*3/uL (ref 150–400)
RBC: 4.13 MIL/uL (ref 3.87–5.11)
RDW: 14.9 % (ref 11.5–15.5)
WBC: 6.9 10*3/uL (ref 4.0–10.5)
nRBC: 0 % (ref 0.0–0.2)

## 2024-02-19 LAB — COMPREHENSIVE METABOLIC PANEL WITH GFR
ALT: 13 U/L (ref 0–44)
AST: 16 U/L (ref 15–41)
Albumin: 3 g/dL — ABNORMAL LOW (ref 3.5–5.0)
Alkaline Phosphatase: 119 U/L (ref 38–126)
Anion gap: 13 (ref 5–15)
BUN: 16 mg/dL (ref 8–23)
CO2: 27 mmol/L (ref 22–32)
Calcium: 8.8 mg/dL — ABNORMAL LOW (ref 8.9–10.3)
Chloride: 102 mmol/L (ref 98–111)
Creatinine, Ser: 0.89 mg/dL (ref 0.44–1.00)
GFR, Estimated: >60 mL/min (ref >60)
Glucose, Bld: 82 mg/dL (ref 70–99)
Potassium: 3.7 mmol/L (ref 3.5–5.1)
Sodium: 142 mmol/L (ref 135–145)
Total Bilirubin: 0.3 mg/dL (ref 0.0–1.2)
Total Protein: 6.1 g/dL — ABNORMAL LOW (ref 6.5–8.1)

## 2024-02-19 LAB — URINALYSIS, ROUTINE W REFLEX MICROSCOPIC
Bacteria, UA: NONE SEEN
Bilirubin Urine: NEGATIVE
Glucose, UA: NEGATIVE mg/dL
Ketones, ur: 5 mg/dL — AB
Leukocytes,Ua: NEGATIVE
Nitrite: NEGATIVE
Protein, ur: 30 mg/dL — AB
Specific Gravity, Urine: 1.021 (ref 1.005–1.030)
pH: 5 (ref 5.0–8.0)

## 2024-02-19 LAB — WET PREP, GENITAL
Sperm: NONE SEEN
Trich, Wet Prep: NONE SEEN
WBC, Wet Prep HPF POC: <10 (ref <10)

## 2024-02-19 LAB — PROTIME-INR
INR: 0.9 (ref 0.8–1.2)
Prothrombin Time: 12.5 s (ref 11.4–15.2)

## 2024-02-19 MED ORDER — IOHEXOL 350 MG/ML SOLN
75.0000 mL | Freq: Once | INTRAVENOUS | Status: AC | PRN
  Administered 2024-02-19: 75 mL via INTRAVENOUS

## 2024-02-19 MED ORDER — METRONIDAZOLE 500 MG PO TABS
500.0000 mg | ORAL_TABLET | Freq: Two times a day (BID) | ORAL | 0 refills | Status: DC
Start: 2024-02-19 — End: 2024-04-26

## 2024-02-19 MED ORDER — FLUCONAZOLE 150 MG PO TABS: 150.0000 mg | ORAL_TABLET | Freq: Every day | ORAL | 0 refills | Status: DC

## 2024-02-19 MED ORDER — HALOPERIDOL LACTATE 5 MG/ML IJ SOLN: 5.0000 mg | Freq: Once | INTRAMUSCULAR | Status: DC
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
## 2024-02-19 NOTE — ED Notes (Signed)
PTAR called no ETA given ?

## 2024-02-19 NOTE — ED Notes (Signed)
 Patient assisted onto bedpan.

## 2024-02-19 NOTE — ED Notes (Signed)
 Unable to obtain second set of blood cultures. Provider made aware

## 2024-02-19 NOTE — Discharge Instructions (Addendum)
 You were evaluated in the emergency room for concern for fever.  You tested positive for vaginal yeast infection and bacterial vaginosis.  A prescription for Flagyl  was sent into your pharmacy.  Please take this twice daily for 1 week.  A prescription for fluconazole  was additionally sent to your pharmacy please take this and then again 72 hours later.  Your CT scan did show that you were constipated.  The remainder of your lab work did not show any significant abnormality.  There is no evidence of UTI, pneumonia, influenza, COVID, electrolyte abnormality or skin infection.  Please follow-up with your primary care doctor to ensure your symptoms are improving.

## 2024-02-19 NOTE — ED Notes (Signed)
 Pt willing to allow IV be placed, difficult IV stick; informed PA only one set of cultures collected at this time

## 2024-02-20 ENCOUNTER — Other Ambulatory Visit

## 2024-02-24 LAB — CULTURE, BLOOD (ROUTINE X 2): Culture: NO GROWTH

## 2024-03-10 ENCOUNTER — Telehealth: Payer: Self-pay

## 2024-03-10 NOTE — Telephone Encounter (Signed)
 Left patient a voice mail to call back to schedule a follow up with one of our provider, Previous Dr. Efrain patient.

## 2024-03-12 ENCOUNTER — Other Ambulatory Visit: Payer: Self-pay | Admitting: Family Medicine

## 2024-03-12 DIAGNOSIS — F411 Generalized anxiety disorder: Secondary | ICD-10-CM

## 2024-03-13 DIAGNOSIS — E1110 Type 2 diabetes mellitus with ketoacidosis without coma: Secondary | ICD-10-CM | POA: Diagnosis not present

## 2024-03-13 DIAGNOSIS — R296 Repeated falls: Secondary | ICD-10-CM | POA: Diagnosis not present

## 2024-03-13 DIAGNOSIS — J45901 Unspecified asthma with (acute) exacerbation: Secondary | ICD-10-CM | POA: Diagnosis not present

## 2024-03-23 ENCOUNTER — Ambulatory Visit: Admitting: Infectious Diseases

## 2024-03-23 ENCOUNTER — Other Ambulatory Visit: Payer: Self-pay | Admitting: Family Medicine

## 2024-03-23 DIAGNOSIS — F250 Schizoaffective disorder, bipolar type: Secondary | ICD-10-CM

## 2024-04-09 ENCOUNTER — Ambulatory Visit: Admitting: Infectious Diseases

## 2024-04-13 DIAGNOSIS — R296 Repeated falls: Secondary | ICD-10-CM | POA: Diagnosis not present

## 2024-04-13 DIAGNOSIS — E1110 Type 2 diabetes mellitus with ketoacidosis without coma: Secondary | ICD-10-CM | POA: Diagnosis not present

## 2024-04-13 DIAGNOSIS — J45901 Unspecified asthma with (acute) exacerbation: Secondary | ICD-10-CM | POA: Diagnosis not present

## 2024-04-22 ENCOUNTER — Other Ambulatory Visit: Payer: Self-pay | Admitting: Family Medicine

## 2024-04-22 DIAGNOSIS — I10 Essential (primary) hypertension: Secondary | ICD-10-CM

## 2024-04-22 DIAGNOSIS — E119 Type 2 diabetes mellitus without complications: Secondary | ICD-10-CM

## 2024-04-23 ENCOUNTER — Ambulatory Visit (HOSPITAL_BASED_OUTPATIENT_CLINIC_OR_DEPARTMENT_OTHER): Admitting: Family Medicine

## 2024-04-24 ENCOUNTER — Emergency Department (HOSPITAL_COMMUNITY)
Admission: EM | Admit: 2024-04-24 | Discharge: 2024-04-25 | Disposition: A | Discharge status: Home / Self Care | Attending: Emergency Medicine | Admitting: Emergency Medicine

## 2024-04-24 ENCOUNTER — Other Ambulatory Visit: Payer: Self-pay

## 2024-04-24 DIAGNOSIS — Z7984 Long term (current) use of oral hypoglycemic drugs: Secondary | ICD-10-CM | POA: Insufficient documentation

## 2024-04-24 DIAGNOSIS — Z794 Long term (current) use of insulin: Secondary | ICD-10-CM | POA: Diagnosis not present

## 2024-04-24 DIAGNOSIS — Z21 Asymptomatic human immunodeficiency virus [HIV] infection status: Secondary | ICD-10-CM | POA: Insufficient documentation

## 2024-04-24 DIAGNOSIS — Z8673 Personal history of transient ischemic attack (TIA), and cerebral infarction without residual deficits: Secondary | ICD-10-CM | POA: Diagnosis not present

## 2024-04-24 DIAGNOSIS — Z79899 Other long term (current) drug therapy: Secondary | ICD-10-CM | POA: Insufficient documentation

## 2024-04-24 DIAGNOSIS — L03211 Cellulitis of face: Secondary | ICD-10-CM | POA: Diagnosis not present

## 2024-04-24 DIAGNOSIS — E119 Type 2 diabetes mellitus without complications: Secondary | ICD-10-CM | POA: Diagnosis not present

## 2024-04-24 DIAGNOSIS — L089 Local infection of the skin and subcutaneous tissue, unspecified: Secondary | ICD-10-CM | POA: Diagnosis not present

## 2024-04-24 DIAGNOSIS — R21 Rash and other nonspecific skin eruption: Secondary | ICD-10-CM | POA: Diagnosis present

## 2024-04-24 DIAGNOSIS — I10 Essential (primary) hypertension: Secondary | ICD-10-CM | POA: Diagnosis not present

## 2024-04-24 DIAGNOSIS — R509 Fever, unspecified: Secondary | ICD-10-CM | POA: Diagnosis not present

## 2024-04-24 MED ORDER — CEPHALEXIN 500 MG PO CAPS: 500.0000 mg | ORAL_CAPSULE | Freq: Four times a day (QID) | ORAL | 0 refills | Status: DC

## 2024-04-24 MED ORDER — CEPHALEXIN 250 MG PO CAPS
500.0000 mg | ORAL_CAPSULE | Freq: Once | ORAL | Status: AC
  Administered 2024-04-24: 500 mg via ORAL
  Filled 2024-04-24: qty 2

## 2024-04-24 NOTE — ED Triage Notes (Signed)
 Pt BIB [facility] from home. Pts daughter reported that pt popped a bump between her eyes and and now has a rash around the area; no drainage noted at this time. Pt also reported she has a sore on her left breast and her buttocks.   Hx of stroke w/ Rside deficits

## 2024-04-24 NOTE — ED Notes (Signed)
Ptar called, 5th in line

## 2024-04-24 NOTE — ED Notes (Signed)
 POA Aquinna [phone] would like an update asap

## 2024-04-24 NOTE — ED Provider Notes (Signed)
 [place] EMERGENCY DEPARTMENT AT [facility] Provider Note   CSN: [id number] Arrival date & time: 04/24/24  1956     Patient presents with: Rash   Susan [name] is a 63 y.o. female.   The history is provided by the patient and medical records. No language interpreter was used.  Rash Location:  Face Facial rash location:  Face, forehead and nose Quality: redness   Quality: not blistering, not draining, not painful and not weeping   Severity:  Moderate Onset quality:  Gradual Duration:  2 weeks Timing:  Constant Progression:  Unchanged Relieved by:  Nothing Worsened by:  Nothing Ineffective treatments:  None tried Associated symptoms: no abdominal pain, no diarrhea, no fatigue, no fever, no headaches, no induration, no nausea, no shortness of breath, no sore throat, not vomiting and not wheezing        Prior to Admission medications   Medication Sig Start Date End Date Taking? Authorizing Provider  Accu-Chek Softclix Lancets lancets Use as instructed 10/30/23   Alvia Corean CROME, FNP  acetaminophen  (TYLENOL ) 325 MG tablet Take 650 mg by mouth at bedtime as needed for fever or moderate pain (pain score 4-6).    [provider]  albuterol  (PROVENTIL ) (2.5 MG/3ML) 0.083% nebulizer solution Take 3 mLs (2.5 mg total) by nebulization every 6 (six) hours as needed for wheezing or shortness of breath. 10/30/23   Alvia Corean CROME, FNP  amLODipine  (NORVASC ) 10 MG tablet TAKE 1 TABLET(10 MG) BY MOUTH DAILY 12/16/23   Alvia Corean CROME, FNP  Blood Glucose Monitoring Suppl DEVI 1 each by Does not apply route in the morning, at noon, and at bedtime. May substitute to any manufacturer covered by patient's insurance. 09/13/23   Alvia Corean CROME, FNP  Cholecalciferol (VITAMIN D ) 50 MCG (2000 UT) tablet Take 2,000 Units by mouth daily.    [provider]  cloNIDine  (CATAPRES ) 0.2 MG tablet Take 1 tablet (0.2 mg total) by mouth 2 (two) times daily.  10/30/23   Alvia Corean CROME, FNP  divalproex  (DEPAKOTE ) 500 MG DR tablet TAKE 1 TABLET(500 MG) BY MOUTH THREE TIMES DAILY 12/13/23   Alvia Corean CROME, FNP  fluconazole  (DIFLUCAN ) 150 MG tablet Take 1 tablet (150 mg total) by mouth daily. Take 1 tablet, may repeat in 72 hours 02/19/24   Donnajean Lynwood DEL, PA-C  gabapentin  (NEURONTIN ) 300 MG capsule TAKE 1 CAPSULE(300 MG) BY MOUTH THREE TIMES DAILY 12/13/23   Alvia Corean CROME, FNP  glucose blood (ACCU-CHEK GUIDE TEST) test strip Use as instructed 02/18/24   Gladis Elsie BROCKS, PA-C  insulin  glargine (LANTUS  SOLOSTAR) 100 UNIT/ML Solostar Pen Inject 15 Units into the skin daily. 10/30/23   Alvia Corean CROME, FNP  Insulin  Pen Needle (PEN NEEDLES 5/16) 30G X 8 MM MISC 1 each by Does not apply route daily. 09/13/23   Alvia Corean CROME, FNP  Lacosamide  150 MG TABS TAKE 1 TABLET(150 MG) BY MOUTH TWICE DAILY 12/13/23   Alvia Corean CROME, FNP  lactulose  (CHRONULAC ) 10 GM/15ML solution Take 20 g by mouth 3 (three) times daily after meals. 10/25/22   [provider]  levETIRAcetam  (KEPPRA ) 1000 MG tablet Take 1 tablet (1,000 mg total) by mouth 2 (two) times daily. 02/18/24   Gladis Elsie BROCKS, PA-C  LORazepam  (ATIVAN ) 0.5 MG tablet TAKE 1 TABLET(0.5 MG) BY MOUTH EVERY 6 HOURS AS NEEDED FOR ANXIETY 12/13/23   Alvia Corean CROME, FNP  metFORMIN  (GLUCOPHAGE ) 500 MG tablet Take 1 tablet (500 mg total) by mouth  2 (two) times daily. 10/30/23   Alvia Corean CROME, FNP  metroNIDAZOLE  (FLAGYL ) 500 MG tablet Take 1 tablet (500 mg total) by mouth 2 (two) times daily. 02/19/24   Donnajean Lynwood DEL, PA-C  nicotine  (NICODERM CQ  - DOSED IN MG/24 HOURS) 14 mg/24hr patch Place 1 patch (14 mg total) onto the skin daily. 10/25/23   Alvia Corean CROME, FNP  omeprazole  (PRILOSEC) 40 MG capsule Take 1 capsule (40 mg total) by mouth daily. 02/18/24   Gladis Elsie BROCKS, PA-C  PHENobarbital  (LUMINAL) 64.8 MG tablet Take 1 tablet (64.8 mg total) by mouth 2  (two) times daily. TAKE 1 TABLET(64.8 MG) BY MOUTH TWICE DAILY 12/13/23   Alvia Corean CROME, FNP  psyllium (METAMUCIL) 58.6 % powder Take 1 packet by mouth daily.    [provider]  risperiDONE  (RISPERDAL ) 2 MG tablet Take 2 mg by mouth at bedtime. 12/15/23   [provider]  rosuvastatin  (CRESTOR ) 20 MG tablet Take 1 tablet (20 mg total) by mouth daily. 10/30/23   Alvia Corean CROME, FNP  sertraline  (ZOLOFT ) 100 MG tablet TAKE 1 TABLET(100 MG) BY MOUTH AT BEDTIME 12/16/23   Alvia Corean CROME, FNP  TIVICAY  50 MG tablet TAKE 1 TABLET(50 MG) BY MOUTH AT BEDTIME 12/13/23   Alvia Corean CROME, FNP  triamterene -hydrochlorothiazide  (MAXZIDE -25) 37.5-25 MG tablet Take 1 tablet by mouth daily. 10/30/23   Alvia Corean CROME, FNP  TRIUMEQ  600-50-300 MG tablet TAKE 1 TABLET BY MOUTH IN THE MORNING 12/13/23   Alvia Corean CROME, FNP    Allergies: Lyrica  [pregabalin ]    Review of Systems  Constitutional:  Negative for chills, fatigue and fever.  HENT:  Negative for congestion, facial swelling, sore throat and trouble swallowing.   Eyes:  Negative for visual disturbance.  Respiratory:  Negative for chest tightness, shortness of breath and wheezing.   Gastrointestinal:  Negative for abdominal pain, constipation, diarrhea, nausea and vomiting.  Genitourinary:  Negative for dysuria.  Musculoskeletal:  Negative for back pain, neck pain and neck stiffness.  Skin:  Positive for rash and wound.  Neurological:  Negative for light-headedness and headaches.  Psychiatric/Behavioral:  Negative for agitation and confusion.   All other systems reviewed and are negative.   Updated Vital Signs BP (!) 128/100 (BP Location: Right Arm)   Pulse 67   Temp 98.2 F (36.8 C) (Oral)   Ht 5' 2 (1.575 m)   Wt 58.1 kg   LMP  (LMP Unknown) Comment: neg hcg 07/20/20  SpO2 100%   BMI 23.41 kg/m   Physical Exam Vitals and nursing note reviewed.  Constitutional:      General: She is not in  acute distress.    Appearance: She is well-developed. She is not ill-appearing, toxic-appearing or diaphoretic.  HENT:     Head: No raccoon eyes, right periorbital erythema or left periorbital erythema.      Nose: No congestion or rhinorrhea.     Mouth/Throat:     Pharynx: No oropharyngeal exudate or posterior oropharyngeal erythema.  Eyes:     Extraocular Movements: Extraocular movements intact.     Conjunctiva/sclera: Conjunctivae normal.     Pupils: Pupils are equal, round, and reactive to light.  Cardiovascular:     Rate and Rhythm: Normal rate and regular rhythm.     Heart sounds: No murmur heard. Pulmonary:     Effort: Pulmonary effort is normal. No respiratory distress.     Breath sounds: Normal breath sounds. No wheezing, rhonchi or rales.  Chest:  Chest wall: No tenderness.  Abdominal:     General: Abdomen is flat.     Palpations: Abdomen is soft.     Tenderness: There is no abdominal tenderness. There is no guarding or rebound.  Musculoskeletal:        General: No swelling or tenderness.     Cervical back: Neck supple.  Skin:    General: Skin is warm and dry.     Capillary Refill: Capillary refill takes less than 2 seconds.     Findings: Erythema present.  Neurological:     General: No focal deficit present.     Mental Status: She is alert.     Sensory: No sensory deficit.     Motor: No weakness.  Psychiatric:        Mood and Affect: Mood normal.     (all labs ordered are listed, but only abnormal results are displayed) Labs Reviewed - No data to display  EKG: None  Radiology: No results found.   Procedures   Medications Ordered in the ED  cephALEXin  (KEFLEX ) capsule 500 mg (500 mg Oral Given 04/24/24 2141)                                    Medical Decision Making Risk Prescription drug management.    GRETE BOSKO is a 63 y.o. female with a past medical history significant for hypertension, obesity, HIV, dyslipidemia, GERD,  schizoaffective disorder, diabetes, migraines, and TIA history who presents with facial rash.  According to patient, for the last 2 weeks she has had some rash on her nose that she was picking at and thinks is infected.  She tried some barrier creams that is not seem to help but now it is more red.  It is not painful.  She denies any speech changes vision changes or nose changes aside from the wound.  There is some surrounding erythema but no abscess drainage per patient.  No other complaints today.  She denies other areas of infection or bothersome symptoms.  She also denies any fevers, chills, congestion, cough, nausea, vomiting, constipation, diarrhea, or urinary changes.  On my exam, patient has some wounds that she appears to have picked on her nose and between her eyebrows that has some surrounding erythema.  There is no crepitance or fluctuance.  No laceration.  Pupils were symmetric and reactive with normal extraocular movements.  No nasal septal hematoma or nasal abnormality on the inside.  Warfarin show exam unremarkable.  No carotid bruit.  Patient otherwise well-appearing.  No chest tenderness or abdominal tenderness and she had no other complaints.  Patient appears to have a cellulitis to the bridge of her nose and on her lower forehead.  No unilateral nature to it that would appear to be shingles and her eye exam was otherwise unremarkable.  Patient otherwise well-appearing.  We had a shared decision-making conversation and agreed to hold on extensive labs or imaging and instead give her prescription for some antibiotics to treat what appears to be a mild cellulitis.  Does not appear to be a periorbital cellulitis or any other more significant affection.  Does not appear to be shingles on exam.  Patient agrees with this and will hold on further workup.  She was given a dose of Keflex  and given a prescription.  She agrees with plan of care and was discharged in good condition.      Final  diagnoses:  Cellulitis of face    ED Discharge Orders          Ordered    cephALEXin  (KEFLEX ) 500 MG capsule  4 times daily        04/24/24 2135            Clinical Impression: 1. Cellulitis of face     Disposition: Discharge  Condition: Good  I have discussed the results, Dx and Tx plan with the pt(& family if present). He/she/they expressed understanding and agree(s) with the plan. Discharge instructions discussed at great length. Strict return precautions discussed and pt &/or family have verbalized understanding of the instructions. No further questions at time of discharge.    Current Discharge Medication List     START taking these medications   Details  cephALEXin  (KEFLEX ) 500 MG capsule Take 1 capsule (500 mg total) by mouth 4 (four) times daily for 7 days. Qty: 28 capsule, Refills: 0        Follow Up: Alvia Corean CROME, FNP [address] [place] [address] [phone]     [facility] Emergency Department at [facility] [address] [place]  [address] [phone]        Clayburn Weekly, Lonni PARAS, MD 04/24/24 2203

## 2024-04-24 NOTE — Discharge Instructions (Signed)
 Your history, exam, and evaluation appears to show a nasal cellulitis.  I do not appreciate any drainage or abscess that would need intervention nor was it going around her eyes.  There was no evidence of shingles on your exam and the rest of your exam was reassuring.  You had no other fevers, chills, chest pain, shortness of breath, abdominal pain, or other areas of concern and we together agreed to give you prescription and a dose of antibiotics for this facial cellulitis and let you go home to follow-up with an outpatient provider.  We agreed to hold on more extensive labs or workup given your lack of other complaints at this time.  Please rest and stay hydrated.  If any symptoms change or worsen acutely, please return to the nearest emergency department.

## 2024-04-25 ENCOUNTER — Other Ambulatory Visit: Payer: Self-pay | Admitting: Family Medicine

## 2024-04-25 DIAGNOSIS — E119 Type 2 diabetes mellitus without complications: Secondary | ICD-10-CM

## 2024-04-26 ENCOUNTER — Telehealth: Admitting: Family

## 2024-04-26 NOTE — Progress Notes (Signed)
 I am sorry, we can not refill chronic medications through an Evisit. I feel your condition warrants further evaluation and I recommend that you be seen in a face-to-face visit.   NOTE: There will be NO CHARGE for this E-Visit   If you are having a true medical emergency, please call 911.     For an urgent face to face visit, [place] has multiple urgent care centers for your convenience.  Click the link below for the full list of locations and hours, walk-in wait times, appointment scheduling options and driving directions:  Urgent Care - [place], [place], [place], [place], [place], [place]  [place]     Your MyChart E-visit questionnaire answers were reviewed by a board certified advanced clinical practitioner to complete your personal care plan based on your specific symptoms.    Thank you for using e-Visits.

## 2024-04-27 ENCOUNTER — Telehealth: Admitting: Physician Assistant

## 2024-04-27 DIAGNOSIS — F250 Schizoaffective disorder, bipolar type: Secondary | ICD-10-CM

## 2024-04-27 DIAGNOSIS — K219 Gastro-esophageal reflux disease without esophagitis: Secondary | ICD-10-CM

## 2024-04-27 DIAGNOSIS — B20 Human immunodeficiency virus [HIV] disease: Secondary | ICD-10-CM | POA: Diagnosis not present

## 2024-04-27 DIAGNOSIS — E1140 Type 2 diabetes mellitus with diabetic neuropathy, unspecified: Secondary | ICD-10-CM

## 2024-04-27 DIAGNOSIS — I10 Essential (primary) hypertension: Secondary | ICD-10-CM | POA: Diagnosis not present

## 2024-04-27 DIAGNOSIS — E119 Type 2 diabetes mellitus without complications: Secondary | ICD-10-CM

## 2024-04-27 DIAGNOSIS — M5442 Lumbago with sciatica, left side: Secondary | ICD-10-CM

## 2024-04-27 DIAGNOSIS — R21 Rash and other nonspecific skin eruption: Secondary | ICD-10-CM

## 2024-04-27 DIAGNOSIS — E785 Hyperlipidemia, unspecified: Secondary | ICD-10-CM

## 2024-04-27 DIAGNOSIS — L03211 Cellulitis of face: Secondary | ICD-10-CM

## 2024-04-27 DIAGNOSIS — Z794 Long term (current) use of insulin: Secondary | ICD-10-CM

## 2024-04-27 DIAGNOSIS — R252 Cramp and spasm: Secondary | ICD-10-CM

## 2024-04-27 DIAGNOSIS — F411 Generalized anxiety disorder: Secondary | ICD-10-CM

## 2024-04-27 MED ORDER — RISPERIDONE 2 MG PO TABS: 2.0000 mg | ORAL_TABLET | Freq: Every day | ORAL | 0 refills | Status: DC

## 2024-04-27 MED ORDER — TRIUMEQ 600-50-300 MG PO TABS: 1.0000 | ORAL_TABLET | Freq: Every morning | ORAL | 0 refills | Status: DC

## 2024-04-27 MED ORDER — METFORMIN HCL 500 MG PO TABS: 500.0000 mg | ORAL_TABLET | Freq: Two times a day (BID) | ORAL | 0 refills | Status: DC

## 2024-04-27 MED ORDER — TIVICAY 50 MG PO TABS: 50.0000 mg | ORAL_TABLET | Freq: Every day | ORAL | 0 refills | Status: DC

## 2024-04-27 MED ORDER — LEVETIRACETAM 1000 MG PO TABS: 1000.0000 mg | ORAL_TABLET | Freq: Two times a day (BID) | ORAL | 0 refills | Status: DC

## 2024-04-27 MED ORDER — AMLODIPINE BESYLATE 10 MG PO TABS: 10.0000 mg | ORAL_TABLET | Freq: Every day | ORAL | 0 refills | Status: DC

## 2024-04-27 MED ORDER — DOXYCYCLINE HYCLATE 100 MG PO TABS: 100.0000 mg | ORAL_TABLET | Freq: Two times a day (BID) | ORAL | 0 refills | Status: DC

## 2024-04-27 MED ORDER — SERTRALINE HCL 100 MG PO TABS: 100.0000 mg | ORAL_TABLET | Freq: Every day | ORAL | 0 refills | Status: DC

## 2024-04-27 MED ORDER — TRIAMTERENE-HCTZ 37.5-25 MG PO TABS
1.0000 | ORAL_TABLET | Freq: Every day | ORAL | 0 refills | Status: DC
Start: 2024-04-27 — End: 2024-06-03

## 2024-04-27 MED ORDER — OMEPRAZOLE 40 MG PO CPDR: 40.0000 mg | DELAYED_RELEASE_CAPSULE | Freq: Every day | ORAL | 0 refills | Status: DC

## 2024-04-27 MED ORDER — DIVALPROEX SODIUM 500 MG PO DR TAB: 500.0000 mg | DELAYED_RELEASE_TABLET | Freq: Three times a day (TID) | ORAL | 0 refills | Status: DC

## 2024-04-27 MED ORDER — CLONIDINE HCL 0.2 MG PO TABS: 0.2000 mg | ORAL_TABLET | Freq: Two times a day (BID) | ORAL | 0 refills | Status: DC

## 2024-04-27 MED ORDER — ROSUVASTATIN CALCIUM 20 MG PO TABS: 20.0000 mg | ORAL_TABLET | Freq: Every day | ORAL | 0 refills | Status: DC

## 2024-04-27 MED ORDER — CYCLOBENZAPRINE HCL 10 MG PO TABS
5.0000 mg | ORAL_TABLET | Freq: Three times a day (TID) | ORAL | 0 refills | Status: DC | PRN
Start: 2024-04-27 — End: 2024-06-03

## 2024-04-27 MED ORDER — LANTUS SOLOSTAR 100 UNIT/ML ~~LOC~~ SOPN: 15.0000 [IU] | PEN_INJECTOR | Freq: Every day | SUBCUTANEOUS | 0 refills | Status: DC

## 2024-04-27 MED ORDER — GABAPENTIN 300 MG PO CAPS: 300.0000 mg | ORAL_CAPSULE | Freq: Three times a day (TID) | ORAL | 0 refills | Status: DC

## 2024-04-27 NOTE — Progress Notes (Signed)
   Thank you for the details you included in the comment boxes. Those details are very helpful in determining the best course of treatment for you and help us  to provide the best care. Because you have multiple issues, we recommend that you schedule a Virtual Urgent Care video visit in order for the provider to better assess what is going on.  The provider will be able to give you a more accurate diagnosis and treatment plan if we can more freely discuss your symptoms and with the addition of a virtual examination.   If you change your visit to a video visit, we will bill your insurance (similar to an office visit) and you will not be charged for this e-Visit. You will be able to stay at home and speak with the first available [facility] advanced practice provider. The link to do a video visit is in the drop down Menu tab of your Welcome screen in MyChart.        I have spent 5 minutes in review of e-visit questionnaire, review and updating patient chart, medical decision making and response to patient.   Angelia Kelp, PA-C

## 2024-04-27 NOTE — Progress Notes (Signed)
 Virtual Visit Consent   Susan [name], you are scheduled for a virtual visit with a [facility] provider today. Just as with appointments in the office, your consent must be obtained to participate. Your consent will be active for this visit and any virtual visit you may have with one of our providers in the next 365 days. If you have a MyChart account, a copy of this consent can be sent to you electronically.  As this is a virtual visit, video technology does not allow for your provider to perform a traditional examination. This may limit your provider's ability to fully assess your condition. If your provider identifies any concerns that need to be evaluated in person or the need to arrange testing (such as labs, EKG, etc.), we will make arrangements to do so. Although advances in technology are sophisticated, we cannot ensure that it will always work on either your end or our end. If the connection with a video visit is poor, the visit may have to be switched to a telephone visit. With either a video or telephone visit, we are not always able to ensure that we have a secure connection.  By engaging in this virtual visit, you consent to the provision of healthcare and authorize for your insurance to be billed (if applicable) for the services provided during this visit. Depending on your insurance coverage, you may receive a charge related to this service.  I need to obtain your verbal consent now. Are you willing to proceed with your visit today? Susan [name] has provided verbal consent on 04/27/2024 for a virtual visit (video or telephone). Susan [name]  Date: 04/27/2024 1:47 PM   Virtual Visit via Video Note   I, Susan [name], connected with  Susan [name]  ([id number], 03-02-1961) on 04/27/24 at 12:45 PM EDT by a video-enabled telemedicine application and verified that I am speaking with the correct person using two identifiers. Daughter, Susan [name], assisted with the  video visit.   Location: Patient: Virtual Visit Location Patient: Home Provider: Virtual Visit Location Provider: Home Office   I discussed the limitations of evaluation and management by telemedicine and the availability of in person appointments. The patient expressed understanding and agreed to proceed.    History of Present Illness: Susan [name] is a 63 y.o. who identifies as a female who was assigned female at birth, and is being seen today for multiple complaints: back pain, facial rash, medication refills.  HPI: Back Pain This is a chronic problem. The current episode started more than 1 year ago. The problem has been waxing and waning since onset. The pain is present in the lumbar spine and gluteal. The quality of the pain is described as cramping and aching. The pain radiates to the left thigh. The pain is mild. The pain is The same all the time. The symptoms are aggravated by lying down, bending, position and sitting. Associated symptoms include leg pain. Pertinent negatives include no bladder incontinence, bowel incontinence, fever, headaches, numbness, paresis, paresthesias or perianal numbness. Risk factors include sedentary lifestyle and lack of exercise. She has tried muscle relaxant (flexeril ) for the symptoms. The treatment provided moderate relief.  Rash This is a recurrent problem. Episode onset: cellulitis of face between eyes and bridge of nose; seen at ER on 04/24/24 and given Keflex  500mg  BID x 7 days, has been taking but area is spreading. The problem has been gradually worsening since onset. The affected locations include the face. The  rash is characterized by redness, scaling, pain and draining. It is unknown if there was an exposure to a precipitant. Pertinent negatives include no congestion, cough, fatigue, fever, joint pain, rhinorrhea or shortness of breath. Past treatments include antibiotics (Keflex ). The treatment provided no relief.   Needing refill of all chronic  medications. Patient with schizoaffective disorder, seizure disorder, HIV, T2DM with Neuropathy. Has an appt with internal medicine on 05/11/24. Establishes with a new PCP on 06/25/24. Needs a few weeks just to get to 05/11/24 appt.   Problems:  Patient Active Problem List   Diagnosis Date Noted   Acute hypoxemic respiratory failure (HCC) 10/19/2023   Abrasion, nose w/o infection 07/24/2022   Screening examination for STD (sexually transmitted disease) 07/04/2021   PRES (posterior reversible encephalopathy syndrome) 07/31/2020   Palliative care encounter    Metabolic encephalopathy 07/22/2020   Altered mental status 07/20/2020   Encephalopathy 04/02/2020   Muscle spasm 04/01/2020   Palliative care by specialist    Adult failure to thrive    Hx of completed stroke 11/24/2019   Acute kidney injury (AKI) with acute tubular necrosis (ATN) (HCC)    Aortic valve endocarditis 11/14/2019   Recurrent falls 10/15/2019   Constipation 02/26/2019   Schizophrenia (HCC) 02/18/2019   AKI (acute kidney injury) (HCC) 02/17/2019   Chronic respiratory failure (HCC) 02/16/2019   Right hip pain 02/21/2018   Medication monitoring encounter 11/27/2017   DNR (do not resuscitate) discussion 08/23/2017   Weakness generalized 06/24/2017   Osteoarthritis 07/14/2016   Abnormal hemoglobin (HCC) 04/18/2016   Hot flashes 10/16/2015   Human immunodeficiency virus (HIV) disease (HCC) 10/05/2015   PTSD (post-traumatic stress disorder) 05/12/2015   MDD (major depressive disorder), recurrent episode, moderate (HCC) 05/11/2015   Cannabis use disorder, severe, dependence (HCC) 05/11/2015   Tobacco use disorder 05/11/2015   Uncontrolled type 2 diabetes mellitus with hyperglycemia, with long-term current use of insulin  (HCC) 04/18/2015   Diabetes mellitus type 2 without retinopathy (HCC) 01/28/2015   Hereditary and idiopathic peripheral neuropathy 06/03/2013   Dysfunctional uterine bleeding 06/04/2012   Dyslipidemia     GERD (gastroesophageal reflux disease)    Schizoaffective disorder, bipolar type (HCC) 04/26/2011   Morbid obesity (HCC) 04/26/2011   HIV disease (HCC) 03/28/2011   Essential hypertension 03/28/2011    Allergies:  Allergies  Allergen Reactions   Lyrica  [Pregabalin ] Swelling    Has LE swelling   Medications:  Current Outpatient Medications:    cyclobenzaprine  (FLEXERIL ) 10 MG tablet, Take 0.5-1 tablets (5-10 mg total) by mouth 3 (three) times daily as needed., Disp: 30 tablet, Rfl: 0   doxycycline  (VIBRA -TABS) 100 MG tablet, Take 1 tablet (100 mg total) by mouth 2 (two) times daily., Disp: 20 tablet, Rfl: 0   abacavir -dolutegravir -lamiVUDine  (TRIUMEQ ) 600-50-300 MG tablet, Take 1 tablet by mouth every morning., Disp: 30 tablet, Rfl: 0   Accu-Chek Softclix Lancets lancets, Use as instructed, Disp: 100 each, Rfl: 12   acetaminophen  (TYLENOL ) 325 MG tablet, Take 650 mg by mouth at bedtime as needed for fever or moderate pain (pain score 4-6)., Disp: , Rfl:    albuterol  (PROVENTIL ) (2.5 MG/3ML) 0.083% nebulizer solution, Take 3 mLs (2.5 mg total) by nebulization every 6 (six) hours as needed for wheezing or shortness of breath., Disp: 150 mL, Rfl: 1   amLODipine  (NORVASC ) 10 MG tablet, Take 1 tablet (10 mg total) by mouth daily., Disp: 90 tablet, Rfl: 0   Blood Glucose Monitoring Suppl DEVI, 1 each by Does not apply route in the  morning, at noon, and at bedtime. May substitute to any manufacturer covered by patient's insurance., Disp: 1 each, Rfl: 0   Cholecalciferol (VITAMIN D ) 50 MCG (2000 UT) tablet, Take 2,000 Units by mouth daily., Disp: , Rfl:    cloNIDine  (CATAPRES ) 0.2 MG tablet, Take 1 tablet (0.2 mg total) by mouth 2 (two) times daily., Disp: 60 tablet, Rfl: 0   divalproex  (DEPAKOTE ) 500 MG DR tablet, Take 1 tablet (500 mg total) by mouth 3 (three) times daily., Disp: 90 tablet, Rfl: 0   dolutegravir  (TIVICAY ) 50 MG tablet, Take 1 tablet (50 mg total) by mouth daily., Disp: 30  tablet, Rfl: 0   gabapentin  (NEURONTIN ) 300 MG capsule, Take 1 capsule (300 mg total) by mouth 3 (three) times daily., Disp: 90 capsule, Rfl: 0   glucose blood (ACCU-CHEK GUIDE TEST) test strip, Use as instructed, Disp: 100 strip, Rfl: 1   insulin  glargine (LANTUS  SOLOSTAR) 100 UNIT/ML Solostar Pen, Inject 15 Units into the skin daily., Disp: 15 mL, Rfl: 0   Insulin  Pen Needle (PEN NEEDLES 5/16) 30G X 8 MM MISC, 1 each by Does not apply route daily., Disp: 100 each, Rfl: 1   Lacosamide  150 MG TABS, TAKE 1 TABLET(150 MG) BY MOUTH TWICE DAILY, Disp: 60 tablet, Rfl: 1   lactulose  (CHRONULAC ) 10 GM/15ML solution, Take 20 g by mouth 3 (three) times daily after meals., Disp: , Rfl:    levETIRAcetam  (KEPPRA ) 1000 MG tablet, Take 1 tablet (1,000 mg total) by mouth 2 (two) times daily., Disp: 60 tablet, Rfl: 0   LORazepam  (ATIVAN ) 0.5 MG tablet, TAKE 1 TABLET(0.5 MG) BY MOUTH EVERY 6 HOURS AS NEEDED FOR ANXIETY, Disp: 60 tablet, Rfl: 3   metFORMIN  (GLUCOPHAGE ) 500 MG tablet, Take 1 tablet (500 mg total) by mouth 2 (two) times daily., Disp: 60 tablet, Rfl: 0   omeprazole  (PRILOSEC) 40 MG capsule, Take 1 capsule (40 mg total) by mouth daily., Disp: 30 capsule, Rfl: 0   PHENobarbital  (LUMINAL) 64.8 MG tablet, Take 1 tablet (64.8 mg total) by mouth 2 (two) times daily. TAKE 1 TABLET(64.8 MG) BY MOUTH TWICE DAILY, Disp: 60 tablet, Rfl: 1   psyllium (METAMUCIL) 58.6 % powder, Take 1 packet by mouth daily., Disp: , Rfl:    risperiDONE  (RISPERDAL ) 2 MG tablet, Take 1 tablet (2 mg total) by mouth at bedtime., Disp: 30 tablet, Rfl: 0   rosuvastatin  (CRESTOR ) 20 MG tablet, Take 1 tablet (20 mg total) by mouth daily., Disp: 30 tablet, Rfl: 0   sertraline  (ZOLOFT ) 100 MG tablet, Take 1 tablet (100 mg total) by mouth at bedtime., Disp: 30 tablet, Rfl: 0   triamterene -hydrochlorothiazide  (MAXZIDE -25) 37.5-25 MG tablet, Take 1 tablet by mouth daily., Disp: 30 tablet, Rfl: 0  Observations/Objective: Patient is  well-developed, well-nourished in no acute distress.  Resting comfortably at home.  Head is normocephalic, atraumatic.  No labored breathing.  Speech is clear and coherent with logical content.  Patient is alert and oriented at baseline.     Assessment and Plan: 1. Cellulitis of face (Primary) - doxycycline  (VIBRA -TABS) 100 MG tablet; Take 1 tablet (100 mg total) by mouth 2 (two) times daily.  Dispense: 20 tablet; Refill: 0  2. Essential hypertension - amLODipine  (NORVASC ) 10 MG tablet; Take 1 tablet (10 mg total) by mouth daily.  Dispense: 90 tablet; Refill: 0 - cloNIDine  (CATAPRES ) 0.2 MG tablet; Take 1 tablet (0.2 mg total) by mouth 2 (two) times daily.  Dispense: 60 tablet; Refill: 0 - triamterene -hydrochlorothiazide  (MAXZIDE -25) 37.5-25 MG tablet;  Take 1 tablet by mouth daily.  Dispense: 30 tablet; Refill: 0  3. Human immunodeficiency virus (HIV) disease (HCC) - abacavir -dolutegravir -lamiVUDine  (TRIUMEQ ) 600-50-300 MG tablet; Take 1 tablet by mouth every morning.  Dispense: 30 tablet; Refill: 0 - dolutegravir  (TIVICAY ) 50 MG tablet; Take 1 tablet (50 mg total) by mouth daily.  Dispense: 30 tablet; Refill: 0  4. Gastroesophageal reflux disease, unspecified whether esophagitis present - omeprazole  (PRILOSEC) 40 MG capsule; Take 1 capsule (40 mg total) by mouth daily.  Dispense: 30 capsule; Refill: 0  5. Schizoaffective disorder, bipolar type (HCC) - risperiDONE  (RISPERDAL ) 2 MG tablet; Take 1 tablet (2 mg total) by mouth at bedtime.  Dispense: 30 tablet; Refill: 0 - divalproex  (DEPAKOTE ) 500 MG DR tablet; Take 1 tablet (500 mg total) by mouth 3 (three) times daily.  Dispense: 90 tablet; Refill: 0 - levETIRAcetam  (KEPPRA ) 1000 MG tablet; Take 1 tablet (1,000 mg total) by mouth 2 (two) times daily.  Dispense: 60 tablet; Refill: 0  6. Diabetes mellitus type 2 without retinopathy (HCC) - insulin  glargine (LANTUS  SOLOSTAR) 100 UNIT/ML Solostar Pen; Inject 15 Units into the skin daily.   Dispense: 15 mL; Refill: 0 - metFORMIN  (GLUCOPHAGE ) 500 MG tablet; Take 1 tablet (500 mg total) by mouth 2 (two) times daily.  Dispense: 60 tablet; Refill: 0  7. Dyslipidemia - rosuvastatin  (CRESTOR ) 20 MG tablet; Take 1 tablet (20 mg total) by mouth daily.  Dispense: 30 tablet; Refill: 0  8. GAD (generalized anxiety disorder) - sertraline  (ZOLOFT ) 100 MG tablet; Take 1 tablet (100 mg total) by mouth at bedtime.  Dispense: 30 tablet; Refill: 0  9. Acute bilateral low back pain with left-sided sciatica - cyclobenzaprine  (FLEXERIL ) 10 MG tablet; Take 0.5-1 tablets (5-10 mg total) by mouth 3 (three) times daily as needed.  Dispense: 30 tablet; Refill: 0  10. Type 2 diabetes mellitus with diabetic neuropathy, with long-term current use of insulin  (HCC) - gabapentin  (NEURONTIN ) 300 MG capsule; Take 1 capsule (300 mg total) by mouth 3 (three) times daily.  Dispense: 90 capsule; Refill: 0  - Stop Keflex   - Start Doxycycline  for facial cellulitis - All at home medications that could be refilled were refilled to hold over until patient is seen by Internal Medicine on 05/11/24 - Flexeril  refilled for back pain - Keep scheduled appt on 05/11/24 as no other refills will be provided through Virtual Urgent Care - Seek in person evaluation if facial infection continues to worsen  Follow Up Instructions: I discussed the assessment and treatment plan with the patient. The patient was provided an opportunity to ask questions and all were answered. The patient agreed with the plan and demonstrated an understanding of the instructions.  A copy of instructions were sent to the patient via MyChart unless otherwise noted below.    The patient was advised to call back or seek an in-person evaluation if the symptoms worsen or if the condition fails to improve as anticipated.    Susan [name]

## 2024-04-27 NOTE — Progress Notes (Signed)
 Thank you for the details you included in the comment boxes. Those details are very helpful in determining the best course of treatment for you and help us  to provide the best care.Because you have multiple issues, we recommend that you schedule a Virtual Urgent Care video visit in order for the provider to better assess what is going on.  The provider will be able to give you a more accurate diagnosis and treatment plan if we can more freely discuss your symptoms and with the addition of a virtual examination.   If you change your visit to a video visit, we will bill your insurance (similar to an office visit) and you will not be charged for this e-Visit. You will be able to stay at home and speak with the first available [facility] advanced practice provider. The link to do a video visit is in the drop down Menu tab of your Welcome screen in MyChart.

## 2024-04-27 NOTE — Patient Instructions (Signed)
 Susan DELENA Brandt, thank you for joining [name], PA-C for today's virtual visit.  While this provider is not your primary care provider (PCP), if your PCP is located in our provider database this encounter information will be shared with them immediately following your visit.   A [facility] MyChart account gives you access to today's visit and all your visits, tests, and labs performed at [facility]  click here if you don't have a [facility] MyChart account or go to [email or web address]  Consent: (Patient) Susan [name] provided verbal consent for this virtual visit at the beginning of the encounter.  Current Medications:  Current Outpatient Medications:    cyclobenzaprine  (FLEXERIL ) 10 MG tablet, Take 0.5-1 tablets (5-10 mg total) by mouth 3 (three) times daily as needed., Disp: 30 tablet, Rfl: 0   doxycycline  (VIBRA -TABS) 100 MG tablet, Take 1 tablet (100 mg total) by mouth 2 (two) times daily., Disp: 20 tablet, Rfl: 0   abacavir -dolutegravir -lamiVUDine  (TRIUMEQ ) 600-50-300 MG tablet, Take 1 tablet by mouth every morning., Disp: 30 tablet, Rfl: 0   Accu-Chek Softclix Lancets lancets, Use as instructed, Disp: 100 each, Rfl: 12   acetaminophen  (TYLENOL ) 325 MG tablet, Take 650 mg by mouth at bedtime as needed for fever or moderate pain (pain score 4-6)., Disp: , Rfl:    albuterol  (PROVENTIL ) (2.5 MG/3ML) 0.083% nebulizer solution, Take 3 mLs (2.5 mg total) by nebulization every 6 (six) hours as needed for wheezing or shortness of breath., Disp: 150 mL, Rfl: 1   amLODipine  (NORVASC ) 10 MG tablet, Take 1 tablet (10 mg total) by mouth daily., Disp: 90 tablet, Rfl: 0   Blood Glucose Monitoring Suppl DEVI, 1 each by Does not apply route in the morning, at noon, and at bedtime. May substitute to any manufacturer covered by patient's insurance., Disp: 1 each, Rfl: 0   Cholecalciferol (VITAMIN D ) 50 MCG (2000 UT) tablet, Take 2,000 Units by mouth daily., Disp: , Rfl:     cloNIDine  (CATAPRES ) 0.2 MG tablet, Take 1 tablet (0.2 mg total) by mouth 2 (two) times daily., Disp: 60 tablet, Rfl: 0   divalproex  (DEPAKOTE ) 500 MG DR tablet, Take 1 tablet (500 mg total) by mouth 3 (three) times daily., Disp: 90 tablet, Rfl: 0   dolutegravir  (TIVICAY ) 50 MG tablet, Take 1 tablet (50 mg total) by mouth daily., Disp: 30 tablet, Rfl: 0   gabapentin  (NEURONTIN ) 300 MG capsule, Take 1 capsule (300 mg total) by mouth 3 (three) times daily., Disp: 90 capsule, Rfl: 0   glucose blood (ACCU-CHEK GUIDE TEST) test strip, Use as instructed, Disp: 100 strip, Rfl: 1   insulin  glargine (LANTUS  SOLOSTAR) 100 UNIT/ML Solostar Pen, Inject 15 Units into the skin daily., Disp: 15 mL, Rfl: 0   Insulin  Pen Needle (PEN NEEDLES 5/16) 30G X 8 MM MISC, 1 each by Does not apply route daily., Disp: 100 each, Rfl: 1   Lacosamide  150 MG TABS, TAKE 1 TABLET(150 MG) BY MOUTH TWICE DAILY, Disp: 60 tablet, Rfl: 1   lactulose  (CHRONULAC ) 10 GM/15ML solution, Take 20 g by mouth 3 (three) times daily after meals., Disp: , Rfl:    levETIRAcetam  (KEPPRA ) 1000 MG tablet, Take 1 tablet (1,000 mg total) by mouth 2 (two) times daily., Disp: 60 tablet, Rfl: 0   LORazepam  (ATIVAN ) 0.5 MG tablet, TAKE 1 TABLET(0.5 MG) BY MOUTH EVERY 6 HOURS AS NEEDED FOR ANXIETY, Disp: 60 tablet, Rfl: 3   metFORMIN  (GLUCOPHAGE ) 500 MG tablet, Take 1 tablet (500 mg total) by  mouth 2 (two) times daily., Disp: 60 tablet, Rfl: 0   omeprazole  (PRILOSEC) 40 MG capsule, Take 1 capsule (40 mg total) by mouth daily., Disp: 30 capsule, Rfl: 0   PHENobarbital  (LUMINAL) 64.8 MG tablet, Take 1 tablet (64.8 mg total) by mouth 2 (two) times daily. TAKE 1 TABLET(64.8 MG) BY MOUTH TWICE DAILY, Disp: 60 tablet, Rfl: 1   psyllium (METAMUCIL) 58.6 % powder, Take 1 packet by mouth daily., Disp: , Rfl:    risperiDONE  (RISPERDAL ) 2 MG tablet, Take 1 tablet (2 mg total) by mouth at bedtime., Disp: 30 tablet, Rfl: 0   rosuvastatin  (CRESTOR ) 20 MG tablet, Take 1  tablet (20 mg total) by mouth daily., Disp: 30 tablet, Rfl: 0   sertraline  (ZOLOFT ) 100 MG tablet, Take 1 tablet (100 mg total) by mouth at bedtime., Disp: 30 tablet, Rfl: 0   triamterene -hydrochlorothiazide  (MAXZIDE -25) 37.5-25 MG tablet, Take 1 tablet by mouth daily., Disp: 30 tablet, Rfl: 0   Medications ordered in this encounter:  Meds ordered this encounter  Medications   doxycycline  (VIBRA -TABS) 100 MG tablet    Sig: Take 1 tablet (100 mg total) by mouth 2 (two) times daily.    Dispense:  20 tablet    Refill:  0    Supervising Provider:   LAMPTEY, PHILIP O [[id number]]   amLODipine  (NORVASC ) 10 MG tablet    Sig: Take 1 tablet (10 mg total) by mouth daily.    Dispense:  90 tablet    Refill:  0    Supervising Provider:   LAMPTEY, PHILIP O [[id number]]   abacavir -dolutegravir -lamiVUDine  (TRIUMEQ ) 600-50-300 MG tablet    Sig: Take 1 tablet by mouth every morning.    Dispense:  30 tablet    Refill:  0    Supervising Provider:   LAMPTEY, PHILIP O [[id number]]   omeprazole  (PRILOSEC) 40 MG capsule    Sig: Take 1 capsule (40 mg total) by mouth daily.    Dispense:  30 capsule    Refill:  0    Supervising Provider:   LAMPTEY, PHILIP O [[id number]]   risperiDONE  (RISPERDAL ) 2 MG tablet    Sig: Take 1 tablet (2 mg total) by mouth at bedtime.    Dispense:  30 tablet    Refill:  0    Supervising Provider:   LAMPTEY, PHILIP O [[id number]]   cloNIDine  (CATAPRES ) 0.2 MG tablet    Sig: Take 1 tablet (0.2 mg total) by mouth 2 (two) times daily.    Dispense:  60 tablet    Refill:  0    Supervising Provider:   LAMPTEY, PHILIP O [[id number]]   divalproex  (DEPAKOTE ) 500 MG DR tablet    Sig: Take 1 tablet (500 mg total) by mouth 3 (three) times daily.    Dispense:  90 tablet    Refill:  0    Supervising Provider:   BLAISE ALEENE KIDD [[id number]]   insulin  glargine (LANTUS  SOLOSTAR) 100 UNIT/ML Solostar Pen    Sig: Inject 15 Units into the skin daily.    Dispense:  15 mL    Refill:  0    Supervising Provider:    LAMPTEY, PHILIP O [id number]   gabapentin  (NEURONTIN ) 300 MG capsule    Sig: Take 1 capsule (300 mg total) by mouth 3 (three) times daily.    Dispense:  90 capsule    Refill:  0    Supervising Provider:   BLAISE ALEENE KIDD [[id number]]   levETIRAcetam  (KEPPRA ) 1000 MG tablet    Sig: Take  1 tablet (1,000 mg total) by mouth 2 (two) times daily.    Dispense:  60 tablet    Refill:  0    One time refill. Further refills to come from her PCP/specialists.    Supervising Provider:   LAMPTEY, PHILIP O [[id number]]   metFORMIN  (GLUCOPHAGE ) 500 MG tablet    Sig: Take 1 tablet (500 mg total) by mouth 2 (two) times daily.    Dispense:  60 tablet    Refill:  0    Supervising Provider:   LAMPTEY, PHILIP O [[id number]]   rosuvastatin  (CRESTOR ) 20 MG tablet    Sig: Take 1 tablet (20 mg total) by mouth daily.    Dispense:  30 tablet    Refill:  0    Maximum Refills Reached    Supervising Provider:   LAMPTEY, PHILIP O [[id number]]   triamterene -hydrochlorothiazide  (MAXZIDE -25) 37.5-25 MG tablet    Sig: Take 1 tablet by mouth daily.    Dispense:  30 tablet    Refill:  0    Supervising Provider:   LAMPTEY, PHILIP O [[id number]]   sertraline  (ZOLOFT ) 100 MG tablet    Sig: Take 1 tablet (100 mg total) by mouth at bedtime.    Dispense:  30 tablet    Refill:  0    Supervising Provider:   LAMPTEY, PHILIP O [[id number]]   dolutegravir  (TIVICAY ) 50 MG tablet    Sig: Take 1 tablet (50 mg total) by mouth daily.    Dispense:  30 tablet    Refill:  0    Supervising Provider:   LAMPTEY, PHILIP O [[id number]]   cyclobenzaprine  (FLEXERIL ) 10 MG tablet    Sig: Take 0.5-1 tablets (5-10 mg total) by mouth 3 (three) times daily as needed.    Dispense:  30 tablet    Refill:  0    Supervising Provider:   LAMPTEY, PHILIP O [[id number]]     *If you need refills on other medications prior to your next appointment, please contact your pharmacy*  Follow-Up: Call back or seek an in-person evaluation if the symptoms worsen or if the  condition fails to improve as anticipated.  [facility] [phone]  Other Instructions Keep scheduled appointment on 05/11/24 with Bascom Borer, NP   If you have been instructed to have an in-person evaluation today at a local Urgent Care facility, please use the link below. It will take you to a list of all of our available [facility], including address, phone number and hours of operation. Please do not delay care.  [facility]  If you or a family member do not have a primary care provider, use the link below to schedule a visit and establish care. When you choose a [facility] primary care physician or advanced practice provider, you gain a long-term partner in health. Find a Primary Care Provider  Learn more about [facility]'s in-office and virtual care options: [facility] - Get Care Now

## 2024-05-05 ENCOUNTER — Telehealth

## 2024-05-05 NOTE — Progress Notes (Signed)
 No show

## 2024-05-11 ENCOUNTER — Ambulatory Visit (INDEPENDENT_AMBULATORY_CARE_PROVIDER_SITE_OTHER): Admitting: Nurse Practitioner

## 2024-05-11 ENCOUNTER — Other Ambulatory Visit: Payer: Self-pay | Admitting: Nurse Practitioner

## 2024-05-11 ENCOUNTER — Encounter: Payer: Self-pay | Admitting: Nurse Practitioner

## 2024-05-11 VITALS — BP 135/115 | HR 76 | Temp 97.7°F

## 2024-05-11 DIAGNOSIS — I10 Essential (primary) hypertension: Secondary | ICD-10-CM

## 2024-05-11 DIAGNOSIS — F250 Schizoaffective disorder, bipolar type: Secondary | ICD-10-CM | POA: Diagnosis not present

## 2024-05-11 DIAGNOSIS — F32A Depression, unspecified: Secondary | ICD-10-CM

## 2024-05-11 DIAGNOSIS — R569 Unspecified convulsions: Secondary | ICD-10-CM

## 2024-05-11 DIAGNOSIS — Z8673 Personal history of transient ischemic attack (TIA), and cerebral infarction without residual deficits: Secondary | ICD-10-CM

## 2024-05-11 DIAGNOSIS — E119 Type 2 diabetes mellitus without complications: Secondary | ICD-10-CM

## 2024-05-11 DIAGNOSIS — K219 Gastro-esophageal reflux disease without esophagitis: Secondary | ICD-10-CM | POA: Diagnosis not present

## 2024-05-11 DIAGNOSIS — E785 Hyperlipidemia, unspecified: Secondary | ICD-10-CM | POA: Diagnosis not present

## 2024-05-11 LAB — POCT GLYCOSYLATED HEMOGLOBIN (HGB A1C): Hemoglobin A1C: 6.7 % — AB (ref 4.0–5.6)

## 2024-05-11 MED ORDER — CLONIDINE HCL 0.2 MG PO TABS: 0.2000 mg | ORAL_TABLET | Freq: Two times a day (BID) | ORAL | 0 refills | Status: DC

## 2024-05-11 MED ORDER — LACOSAMIDE 150 MG PO TABS: 1.0000 | ORAL_TABLET | Freq: Two times a day (BID) | ORAL | 1 refills | Status: AC

## 2024-05-11 MED ORDER — LEVETIRACETAM 1000 MG PO TABS: 1000.0000 mg | ORAL_TABLET | Freq: Two times a day (BID) | ORAL | 0 refills | Status: DC

## 2024-05-11 MED ORDER — AMLODIPINE BESYLATE 10 MG PO TABS: 10.0000 mg | ORAL_TABLET | Freq: Every day | ORAL | 0 refills | Status: DC

## 2024-05-11 MED ORDER — ROSUVASTATIN CALCIUM 20 MG PO TABS: 20.0000 mg | ORAL_TABLET | Freq: Every day | ORAL | 0 refills | Status: DC

## 2024-05-11 MED ORDER — OMEPRAZOLE 40 MG PO CPDR: 40.0000 mg | DELAYED_RELEASE_CAPSULE | Freq: Every day | ORAL | 0 refills | Status: DC

## 2024-05-11 MED ORDER — PHENOBARBITAL 64.8 MG PO TABS: 64.8000 mg | ORAL_TABLET | Freq: Two times a day (BID) | ORAL | 1 refills | Status: DC

## 2024-05-11 MED ORDER — DIVALPROEX SODIUM 500 MG PO DR TAB: 500.0000 mg | DELAYED_RELEASE_TABLET | Freq: Three times a day (TID) | ORAL | 0 refills | Status: DC

## 2024-05-11 MED ORDER — METFORMIN HCL 500 MG PO TABS: 500.0000 mg | ORAL_TABLET | Freq: Two times a day (BID) | ORAL | 0 refills | Status: DC
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
## 2024-05-14 ENCOUNTER — Other Ambulatory Visit: Payer: Self-pay | Admitting: Family Medicine

## 2024-05-14 ENCOUNTER — Other Ambulatory Visit: Payer: Self-pay

## 2024-05-14 DIAGNOSIS — E1140 Type 2 diabetes mellitus with diabetic neuropathy, unspecified: Secondary | ICD-10-CM

## 2024-05-14 DIAGNOSIS — R296 Repeated falls: Secondary | ICD-10-CM | POA: Diagnosis not present

## 2024-05-14 DIAGNOSIS — E119 Type 2 diabetes mellitus without complications: Secondary | ICD-10-CM

## 2024-05-14 DIAGNOSIS — J45901 Unspecified asthma with (acute) exacerbation: Secondary | ICD-10-CM | POA: Diagnosis not present

## 2024-05-14 DIAGNOSIS — E1110 Type 2 diabetes mellitus with ketoacidosis without coma: Secondary | ICD-10-CM | POA: Diagnosis not present

## 2024-05-14 DIAGNOSIS — M5442 Lumbago with sciatica, left side: Secondary | ICD-10-CM

## 2024-05-14 DIAGNOSIS — F250 Schizoaffective disorder, bipolar type: Secondary | ICD-10-CM

## 2024-05-14 DIAGNOSIS — B20 Human immunodeficiency virus [HIV] disease: Secondary | ICD-10-CM

## 2024-05-14 MED ORDER — INSULIN PEN NEEDLE 31G X 8 MM MISC: 1.0000 | 3 refills | Status: DC | PRN

## 2024-05-14 NOTE — Telephone Encounter (Unsigned)
 Copied from CRM #[id number]. Topic: Clinical - Medication Refill >> May 14, 2024  3:43 PM Tiffini S wrote: Medication: Insulin  Pen Needle 31G X 8 MM MISC,  PHENobarbital  (LUMINAL) 64.8 MG tablet,  dolutegravir  (TIVICAY ) 50 MG tablet,  cyclobenzaprine  (FLEXERIL ) 10 MG tablet  Seroquel  400mg - twice a day  gabapentin  (NEURONTIN ) 400 MG capsule  Has the patient contacted their pharmacy? Yes, have requested several times by fax  (Agent: If no, request that the patient contact the pharmacy for the refill. If patient does not wish to contact the pharmacy document the reason why and proceed with request.) (Agent: If yes, when and what did the pharmacy advise?)  This is the patient's preferred pharmacy:  [phone] - [place], [place] - [address] AT [facility] [address] [place] [address] Phone: [phone] Fax: [phone]    Is this the correct pharmacy for this prescription? Yes If no, delete pharmacy and type the correct one.   Has the prescription been filled recently? Yes  Is the patient out of the medication? Yes  Has the patient been seen for an appointment in the last year OR does the patient have an upcoming appointment? Yes  Can we respond through MyChart? Yes or please call daughter   Agent: Please be advised that Rx refills may take up to 3 business days. We ask that you follow-up with your pharmacy.

## 2024-06-02 NOTE — Telephone Encounter (Signed)
 Patient is requesting refill on HIV rx. Per chart she no-showed her LOV 04/09/24 with Infect Disease and has not rescheduled.   She has only seen you one time, 05/11/24. She has a NP appt at [facility] on 06/25/24. Unsure who patient is actually choosing as her PCP.   Please advise, thank you!

## 2024-06-03 ENCOUNTER — Other Ambulatory Visit: Payer: Self-pay | Admitting: Nurse Practitioner

## 2024-06-03 ENCOUNTER — Telehealth: Payer: Self-pay

## 2024-06-03 ENCOUNTER — Other Ambulatory Visit: Payer: Self-pay

## 2024-06-03 DIAGNOSIS — R062 Wheezing: Secondary | ICD-10-CM

## 2024-06-03 DIAGNOSIS — M5442 Lumbago with sciatica, left side: Secondary | ICD-10-CM

## 2024-06-03 DIAGNOSIS — E1140 Type 2 diabetes mellitus with diabetic neuropathy, unspecified: Secondary | ICD-10-CM

## 2024-06-03 DIAGNOSIS — E785 Hyperlipidemia, unspecified: Secondary | ICD-10-CM

## 2024-06-03 DIAGNOSIS — F250 Schizoaffective disorder, bipolar type: Secondary | ICD-10-CM

## 2024-06-03 DIAGNOSIS — I10 Essential (primary) hypertension: Secondary | ICD-10-CM

## 2024-06-03 DIAGNOSIS — K219 Gastro-esophageal reflux disease without esophagitis: Secondary | ICD-10-CM

## 2024-06-03 DIAGNOSIS — E119 Type 2 diabetes mellitus without complications: Secondary | ICD-10-CM

## 2024-06-03 MED ORDER — CYCLOBENZAPRINE HCL 10 MG PO TABS: 5.0000 mg | ORAL_TABLET | Freq: Three times a day (TID) | ORAL | 0 refills | Status: DC | PRN

## 2024-06-03 MED ORDER — OMEPRAZOLE 40 MG PO CPDR: 40.0000 mg | DELAYED_RELEASE_CAPSULE | Freq: Every day | ORAL | 0 refills | Status: DC

## 2024-06-03 MED ORDER — TRIAMTERENE-HCTZ 37.5-25 MG PO TABS: 1.0000 | ORAL_TABLET | Freq: Every day | ORAL | 0 refills | Status: DC

## 2024-06-03 MED ORDER — ALBUTEROL SULFATE (2.5 MG/3ML) 0.083% IN NEBU: 2.5000 mg | INHALATION_SOLUTION | Freq: Four times a day (QID) | RESPIRATORY_TRACT | 1 refills | Status: AC | PRN

## 2024-06-03 MED ORDER — GABAPENTIN 300 MG PO CAPS: 300.0000 mg | ORAL_CAPSULE | Freq: Three times a day (TID) | ORAL | 0 refills | Status: DC

## 2024-06-03 MED ORDER — LANTUS SOLOSTAR 100 UNIT/ML ~~LOC~~ SOPN: 15.0000 [IU] | PEN_INJECTOR | Freq: Every day | SUBCUTANEOUS | 0 refills | Status: DC

## 2024-06-03 MED ORDER — LEVETIRACETAM 1000 MG PO TABS: 1000.0000 mg | ORAL_TABLET | Freq: Two times a day (BID) | ORAL | 0 refills | Status: DC

## 2024-06-03 MED ORDER — CLONIDINE HCL 0.2 MG PO TABS
0.2000 mg | ORAL_TABLET | Freq: Two times a day (BID) | ORAL | 0 refills | Status: DC
Start: 2024-06-03 — End: 2024-07-09

## 2024-06-03 MED ORDER — AMLODIPINE BESYLATE 10 MG PO TABS: 10.0000 mg | ORAL_TABLET | Freq: Every day | ORAL | 0 refills | Status: DC

## 2024-06-03 MED ORDER — METFORMIN HCL 500 MG PO TABS: 500.0000 mg | ORAL_TABLET | Freq: Two times a day (BID) | ORAL | 0 refills | Status: DC

## 2024-06-03 MED ORDER — ROSUVASTATIN CALCIUM 20 MG PO TABS: 20.0000 mg | ORAL_TABLET | Freq: Every day | ORAL | 0 refills | Status: DC

## 2024-06-03 NOTE — Telephone Encounter (Signed)
 Now patient is requesting you to refill ALL of her medications. Even though most DO NOT need refills. I am not sure where the miscommunication is coming into play. I have explained to her several times that we refilled most of her medications when she was here in the office, and that her HIV rx and a few others, come from Infect Disease provider. She is OVERDUE for follow up with their office.   Please advise, thank you!

## 2024-06-03 NOTE — Telephone Encounter (Signed)
 Left patient voice mail to call back an rescheduled missed appointment with Dr. Dea.

## 2024-06-05 ENCOUNTER — Telehealth: Admitting: Nurse Practitioner

## 2024-06-05 DIAGNOSIS — M545 Low back pain, unspecified: Secondary | ICD-10-CM

## 2024-06-06 NOTE — Progress Notes (Signed)
 Your HIV medication will need to be filled by your HIV doctor. Your PCP already sent your insulin  and pen needles to [facility] on [address]. I would recommend you call [facility] and speak to a pharmacy tech regarding picking these up.   We are sorry that you are not feeling well.  Here is how we plan to help!  Based on what you have shared with me it looks like you mostly have acute back pain.  Acute back pain is defined as musculoskeletal pain that can resolve in 1-3 weeks with conservative treatment.  I have prescribed a muscle relaxant Tizanidine 2 mg every eight hours as needed which is a muscle relaxer  Some patients experience stomach irritation or in increased heartburn with anti-inflammatory drugs.  Please keep in mind that muscle relaxer's can cause fatigue and should not be taken while at work or driving.  Back pain is very common.  The pain often gets better over time.  The cause of back pain is usually not dangerous.  Most people can learn to manage their back pain on their own.  Home Care Stay active.  Start with short walks on flat ground if you can.  Try to walk farther each day. Do not sit, drive or stand in one place for more than 30 minutes.  Do not stay in bed. Do not avoid exercise or work.  Activity can help your back heal faster. Be careful when you bend or lift an object.  Bend at your knees, keep the object close to you, and do not twist. Sleep on a firm mattress.  Lie on your side, and bend your knees.  If you lie on your back, put a pillow under your knees. Only take medicines as told by your doctor. Put ice on the injured area. Put ice in a plastic bag Place a towel between your skin and the bag Leave the ice on for 15-20 minutes, 3-4 times a day for the first 2-3 days. 210 After that, you can switch between ice and heat packs. Ask your doctor about back exercises or massage. Avoid feeling anxious or stressed.  Find good ways to deal with stress, such as  exercise.  Get Help Right Way If: Your pain does not go away with rest or medicine. Your pain does not go away in 1 week. You have new problems. You do not feel well. The pain spreads into your legs. You cannot control when you poop (bowel movement) or pee (urinate) You feel sick to your stomach (nauseous) or throw up (vomit) You have belly (abdominal) pain. You feel like you may pass out (faint). If you develop a fever.  Make Sure you: Understand these instructions. Will watch your condition Will get help right away if you are not doing well or get worse.  Your e-visit answers were reviewed by a board certified advanced clinical practitioner to complete your personal care plan.  Depending on the condition, your plan could have included both over the counter or prescription medications.  If there is a problem please reply  once you have received a response from your provider.  Your safety is important to us .  If you have drug allergies check your prescription carefully.    You can use MyChart to ask questions about today's visit, request a non-urgent call back, or ask for a work or school excuse for 24 hours related to this e-Visit. If it has been greater than 24 hours you will need to follow up with  your provider, or enter a new e-Visit to address those concerns.  You will get an e-mail in the next two days asking about your experience.  I hope that your e-visit has been valuable and will speed your recovery. Thank you for using e-visits.   I have spent 5 minutes in review of e-visit questionnaire, review and updating patient chart, medical decision making and response to patient.   Paulette Lynch W Terriah Reggio, NP

## 2024-06-08 ENCOUNTER — Other Ambulatory Visit: Payer: Self-pay | Admitting: Nurse Practitioner

## 2024-06-08 DIAGNOSIS — B20 Human immunodeficiency virus [HIV] disease: Secondary | ICD-10-CM

## 2024-06-08 MED ORDER — TIVICAY 50 MG PO TABS: 50.0000 mg | ORAL_TABLET | Freq: Every day | ORAL | 0 refills | Status: DC

## 2024-06-08 MED ORDER — TRIUMEQ 600-50-300 MG PO TABS: 1.0000 | ORAL_TABLET | Freq: Every morning | ORAL | 0 refills | Status: DC

## 2024-06-08 NOTE — Telephone Encounter (Signed)
 Will forward to provider

## 2024-06-09 ENCOUNTER — Other Ambulatory Visit: Payer: Self-pay | Admitting: Nurse Practitioner

## 2024-06-09 DIAGNOSIS — F250 Schizoaffective disorder, bipolar type: Secondary | ICD-10-CM

## 2024-06-09 NOTE — Telephone Encounter (Signed)
 Please advise [facility]

## 2024-06-13 DIAGNOSIS — R296 Repeated falls: Secondary | ICD-10-CM | POA: Diagnosis not present

## 2024-06-13 DIAGNOSIS — J45901 Unspecified asthma with (acute) exacerbation: Secondary | ICD-10-CM | POA: Diagnosis not present

## 2024-06-13 DIAGNOSIS — E1110 Type 2 diabetes mellitus with ketoacidosis without coma: Secondary | ICD-10-CM | POA: Diagnosis not present

## 2024-06-15 ENCOUNTER — Ambulatory Visit: Payer: Self-pay

## 2024-06-15 DIAGNOSIS — E119 Type 2 diabetes mellitus without complications: Secondary | ICD-10-CM

## 2024-06-15 NOTE — Telephone Encounter (Signed)
 Pt states that pharmacy needs refill for insulin  needles. Writer called pharmacy as there is a current rx in [facility]. Pharmacy will have medication filled. Pt called and updated.    Copied from CRM [id number]. Topic: Clinical - Red Word Triage >> Jun 15, 2024  2:24 PM Roselie BROCKS wrote: Red Word that prompted transfer to Nurse Triage: Patient has been out of insulin  needles over a week , and has been using the same needle over. I see she was dismissed from Dr Oley practice ,and she unable to get any needles and can't take her insulin .

## 2024-06-16 ENCOUNTER — Other Ambulatory Visit: Payer: Self-pay

## 2024-06-16 DIAGNOSIS — E119 Type 2 diabetes mellitus without complications: Secondary | ICD-10-CM

## 2024-06-16 MED ORDER — INSULIN PEN NEEDLE 31G X 8 MM MISC
1.0000 | 3 refills | Status: DC | PRN
Start: 2024-06-16 — End: 2024-06-16

## 2024-06-16 MED ORDER — INSULIN PEN NEEDLE 31G X 8 MM MISC
1.0000 | 3 refills | Status: AC | PRN
Start: 2024-06-16 — End: ?

## 2024-06-16 NOTE — Addendum Note (Signed)
 Addended by: Makynlee Kressin M on: 06/16/2024 02:39 PM   Modules accepted: Orders

## 2024-06-16 NOTE — Telephone Encounter (Signed)
 Needles sent.

## 2024-06-25 ENCOUNTER — Ambulatory Visit (HOSPITAL_BASED_OUTPATIENT_CLINIC_OR_DEPARTMENT_OTHER): Admitting: Family Medicine

## 2024-06-29 ENCOUNTER — Other Ambulatory Visit: Payer: Self-pay | Admitting: Medical Genetics

## 2024-06-29 DIAGNOSIS — Z006 Encounter for examination for normal comparison and control in clinical research program: Secondary | ICD-10-CM

## 2024-07-04 ENCOUNTER — Other Ambulatory Visit: Payer: Self-pay | Admitting: Nurse Practitioner

## 2024-07-04 DIAGNOSIS — I10 Essential (primary) hypertension: Secondary | ICD-10-CM

## 2024-07-04 DIAGNOSIS — E785 Hyperlipidemia, unspecified: Secondary | ICD-10-CM

## 2024-07-04 DIAGNOSIS — K219 Gastro-esophageal reflux disease without esophagitis: Secondary | ICD-10-CM

## 2024-07-06 ENCOUNTER — Telehealth: Payer: Self-pay

## 2024-07-06 NOTE — Telephone Encounter (Signed)
 [facility] Refill: LORazepam  (ATIVAN ) 0.5 MG tablet [[id number]]

## 2024-07-07 ENCOUNTER — Other Ambulatory Visit: Payer: Self-pay

## 2024-07-07 DIAGNOSIS — F411 Generalized anxiety disorder: Secondary | ICD-10-CM

## 2024-07-07 NOTE — Telephone Encounter (Signed)
 Please advise [facility]

## 2024-07-07 NOTE — Telephone Encounter (Signed)
 Sent to pcp [facility]

## 2024-07-12 ENCOUNTER — Other Ambulatory Visit: Payer: Self-pay | Admitting: Nurse Practitioner

## 2024-07-12 DIAGNOSIS — B20 Human immunodeficiency virus [HIV] disease: Secondary | ICD-10-CM

## 2024-07-14 ENCOUNTER — Other Ambulatory Visit: Payer: Self-pay | Admitting: Family Medicine

## 2024-07-14 DIAGNOSIS — E1140 Type 2 diabetes mellitus with diabetic neuropathy, unspecified: Secondary | ICD-10-CM

## 2024-07-14 NOTE — Telephone Encounter (Signed)
 Please advise [facility]

## 2024-07-16 ENCOUNTER — Encounter
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
## 2024-08-01 ENCOUNTER — Encounter (HOSPITAL_COMMUNITY): Payer: Self-pay

## 2024-08-01 ENCOUNTER — Other Ambulatory Visit: Payer: Self-pay | Admitting: Nurse Practitioner

## 2024-08-01 ENCOUNTER — Observation Stay (HOSPITAL_COMMUNITY)
Admission: EM | Admit: 2024-08-01 | Discharge: 2024-08-03 | Disposition: A | Attending: Internal Medicine | Admitting: Internal Medicine

## 2024-08-01 ENCOUNTER — Emergency Department (HOSPITAL_COMMUNITY)

## 2024-08-01 ENCOUNTER — Other Ambulatory Visit: Payer: Self-pay

## 2024-08-01 DIAGNOSIS — F411 Generalized anxiety disorder: Secondary | ICD-10-CM | POA: Insufficient documentation

## 2024-08-01 DIAGNOSIS — Z7982 Long term (current) use of aspirin: Secondary | ICD-10-CM | POA: Diagnosis not present

## 2024-08-01 DIAGNOSIS — E041 Nontoxic single thyroid nodule: Secondary | ICD-10-CM | POA: Insufficient documentation

## 2024-08-01 DIAGNOSIS — I110 Hypertensive heart disease with heart failure: Secondary | ICD-10-CM | POA: Insufficient documentation

## 2024-08-01 DIAGNOSIS — G40909 Epilepsy, unspecified, not intractable, without status epilepticus: Secondary | ICD-10-CM | POA: Insufficient documentation

## 2024-08-01 DIAGNOSIS — Z794 Long term (current) use of insulin: Secondary | ICD-10-CM | POA: Diagnosis not present

## 2024-08-01 DIAGNOSIS — F17200 Nicotine dependence, unspecified, uncomplicated: Secondary | ICD-10-CM | POA: Diagnosis not present

## 2024-08-01 DIAGNOSIS — K219 Gastro-esophageal reflux disease without esophagitis: Secondary | ICD-10-CM | POA: Diagnosis not present

## 2024-08-01 DIAGNOSIS — Z7901 Long term (current) use of anticoagulants: Secondary | ICD-10-CM | POA: Insufficient documentation

## 2024-08-01 DIAGNOSIS — M5442 Lumbago with sciatica, left side: Secondary | ICD-10-CM | POA: Insufficient documentation

## 2024-08-01 DIAGNOSIS — E785 Hyperlipidemia, unspecified: Secondary | ICD-10-CM | POA: Insufficient documentation

## 2024-08-01 DIAGNOSIS — J189 Pneumonia, unspecified organism: Principal | ICD-10-CM

## 2024-08-01 DIAGNOSIS — R059 Cough, unspecified: Secondary | ICD-10-CM | POA: Diagnosis present

## 2024-08-01 DIAGNOSIS — I5020 Unspecified systolic (congestive) heart failure: Secondary | ICD-10-CM | POA: Diagnosis not present

## 2024-08-01 DIAGNOSIS — J188 Other pneumonia, unspecified organism: Secondary | ICD-10-CM | POA: Diagnosis not present

## 2024-08-01 DIAGNOSIS — B20 Human immunodeficiency virus [HIV] disease: Secondary | ICD-10-CM

## 2024-08-01 DIAGNOSIS — E119 Type 2 diabetes mellitus without complications: Secondary | ICD-10-CM | POA: Diagnosis not present

## 2024-08-01 DIAGNOSIS — F259 Schizoaffective disorder, unspecified: Secondary | ICD-10-CM | POA: Diagnosis not present

## 2024-08-01 DIAGNOSIS — Z79899 Other long term (current) drug therapy: Secondary | ICD-10-CM | POA: Diagnosis not present

## 2024-08-01 DIAGNOSIS — Z8673 Personal history of transient ischemic attack (TIA), and cerebral infarction without residual deficits: Secondary | ICD-10-CM | POA: Insufficient documentation

## 2024-08-01 DIAGNOSIS — Z1152 Encounter for screening for COVID-19: Secondary | ICD-10-CM | POA: Insufficient documentation

## 2024-08-01 DIAGNOSIS — Z21 Asymptomatic human immunodeficiency virus [HIV] infection status: Secondary | ICD-10-CM | POA: Diagnosis not present

## 2024-08-01 DIAGNOSIS — G8929 Other chronic pain: Secondary | ICD-10-CM | POA: Diagnosis not present

## 2024-08-01 DIAGNOSIS — E1140 Type 2 diabetes mellitus with diabetic neuropathy, unspecified: Secondary | ICD-10-CM

## 2024-08-01 LAB — COMPREHENSIVE METABOLIC PANEL WITH GFR
ALT: 15 U/L (ref 0–44)
AST: 15 U/L (ref 15–41)
Albumin: 2.8 g/dL — ABNORMAL LOW (ref 3.5–5.0)
Alkaline Phosphatase: 92 U/L (ref 38–126)
Anion gap: 11 (ref 5–15)
BUN: 15 mg/dL (ref 8–23)
CO2: 29 mmol/L (ref 22–32)
Calcium: 8.3 mg/dL — ABNORMAL LOW (ref 8.9–10.3)
Chloride: 99 mmol/L (ref 98–111)
Creatinine, Ser: 1.05 mg/dL — ABNORMAL HIGH (ref 0.44–1.00)
GFR, Estimated: >60 mL/min (ref >60)
Glucose, Bld: 79 mg/dL (ref 70–99)
Potassium: 3.9 mmol/L (ref 3.5–5.1)
Sodium: 139 mmol/L (ref 135–145)
Total Bilirubin: 0.2 mg/dL (ref 0.0–1.2)
Total Protein: 6.1 g/dL — ABNORMAL LOW (ref 6.5–8.1)

## 2024-08-01 LAB — URINALYSIS, ROUTINE W REFLEX MICROSCOPIC
Bilirubin Urine: NEGATIVE
Glucose, UA: NEGATIVE mg/dL
Ketones, ur: NEGATIVE mg/dL
Nitrite: NEGATIVE
Protein, ur: NEGATIVE mg/dL
Specific Gravity, Urine: <1.005 — ABNORMAL LOW (ref 1.005–1.030)
pH: 6.5 (ref 5.0–8.0)

## 2024-08-01 LAB — RESP PANEL BY RT-PCR (RSV, FLU A&B, COVID)  RVPGX2
Influenza A by PCR: NEGATIVE
Influenza B by PCR: NEGATIVE
Resp Syncytial Virus by PCR: NEGATIVE
SARS Coronavirus 2 by RT PCR: NEGATIVE

## 2024-08-01 LAB — CBC WITH DIFFERENTIAL/PLATELET
Abs Immature Granulocytes: 0.1 K/uL — ABNORMAL HIGH (ref 0.00–0.07)
Basophils Absolute: 0 K/uL (ref 0.0–0.1)
Basophils Relative: 1 %
Eosinophils Absolute: 0.3 K/uL (ref 0.0–0.5)
Eosinophils Relative: 4 %
HCT: 40 % (ref 36.0–46.0)
Hemoglobin: 13.1 g/dL (ref 12.0–15.0)
Immature Granulocytes: 2 %
Lymphocytes Relative: 39 %
Lymphs Abs: 2.7 K/uL (ref 0.7–4.0)
MCH: 32.4 pg (ref 26.0–34.0)
MCHC: 32.8 g/dL (ref 30.0–36.0)
MCV: 99 fL (ref 80.0–100.0)
Monocytes Absolute: 0.6 K/uL (ref 0.1–1.0)
Monocytes Relative: 8 %
Neutro Abs: 3.1 K/uL (ref 1.7–7.7)
Neutrophils Relative %: 46 %
Platelets: 198 K/uL (ref 150–400)
RBC: 4.04 MIL/uL (ref 3.87–5.11)
RDW: 14.5 % (ref 11.5–15.5)
WBC: 6.8 K/uL (ref 4.0–10.5)
nRBC: 0 % (ref 0.0–0.2)

## 2024-08-01 LAB — URINALYSIS, MICROSCOPIC (REFLEX)

## 2024-08-01 LAB — CBG MONITORING, ED: Glucose-Capillary: 75 mg/dL (ref 70–99)

## 2024-08-01 LAB — I-STAT CG4 LACTIC ACID, ED: Lactic Acid, Venous: 1 mmol/L (ref 0.5–1.9)

## 2024-08-01 MED ORDER — NICOTINE 7 MG/24HR TD PT24
7.0000 mg | MEDICATED_PATCH | Freq: Every day | TRANSDERMAL | Status: DC
  Administered 2024-08-02 – 2024-08-03 (×2): 7 mg via TRANSDERMAL
  Filled 2024-08-01 (×2): qty 1

## 2024-08-01 MED ORDER — ENOXAPARIN SODIUM 40 MG/0.4ML IJ SOSY
40.0000 mg | PREFILLED_SYRINGE | Freq: Every day | INTRAMUSCULAR | Status: DC
  Administered 2024-08-02 – 2024-08-03 (×2): 40 mg via SUBCUTANEOUS
  Filled 2024-08-01 (×2): qty 0.4

## 2024-08-01 MED ORDER — ACETAMINOPHEN 650 MG RE SUPP: 650.0000 mg | Freq: Four times a day (QID) | RECTAL | Status: DC | PRN

## 2024-08-01 MED ORDER — ACETAMINOPHEN 325 MG PO TABS: 650.0000 mg | ORAL_TABLET | Freq: Four times a day (QID) | ORAL | Status: DC | PRN

## 2024-08-01 MED ORDER — SODIUM CHLORIDE 0.9 % IV SOLN
1.0000 g | Freq: Once | INTRAVENOUS | Status: AC
  Administered 2024-08-01: 1 g via INTRAVENOUS
  Filled 2024-08-01: qty 10

## 2024-08-01 MED ORDER — DOXYCYCLINE HYCLATE 100 MG PO TABS
100.0000 mg | ORAL_TABLET | Freq: Once | ORAL | Status: AC
  Administered 2024-08-01: 100 mg via ORAL
  Filled 2024-08-01: qty 1

## 2024-08-01 NOTE — ED Provider Notes (Signed)
 [facility] AT [facility] Provider Note   CSN: [id number] Arrival date & time: 08/01/24  2116     Patient presents with: Seizures   Susan [name] is a 63 y.o. female presenting from home with fever and cough ongoing for several days.  Her daughter who is helping take care of her reports the patient has had ongoing temperature for a few days and she has been getting Tylenol  every 6 hours.  They are concerned about facial twitching earlier today with her jaw, which is atypical for her.  She does have a history of seizures in the past we can often cause upper body shaking, but has not had these symptoms before.  Patient says she feels lousy because she has a fever and has been coughing.  She is worried about pneumonia.  She has been compliant with all her medications.  She has HIV and is compliant with her meds and has an undetectable viral load according to the patient and her daughter.   HPI     Prior to Admission medications   Medication Sig Start Date End Date Taking? Authorizing Provider  Accu-Chek Softclix Lancets lancets Use as instructed 10/30/23   Alvia Corean CROME, FNP  acetaminophen  (TYLENOL ) 325 MG tablet Take 650 mg by mouth at bedtime as needed for fever or moderate pain (pain score 4-6).    [provider]  albuterol  (PROVENTIL ) (2.5 MG/3ML) 0.083% nebulizer solution Take 3 mLs (2.5 mg total) by nebulization every 6 (six) hours as needed for wheezing or shortness of breath. 06/03/24   Oley Bascom RAMAN, NP  amLODipine  (NORVASC ) 10 MG tablet Take 1 tablet (10 mg total) by mouth daily. 06/03/24   Oley Bascom RAMAN, NP  Blood Glucose Monitoring Suppl DEVI 1 each by Does not apply route in the morning, at noon, and at bedtime. May substitute to any manufacturer covered by patient's insurance. 09/13/23   Alvia Corean CROME, FNP  Cholecalciferol (VITAMIN D ) 50 MCG (2000 UT) tablet Take 2,000 Units by mouth daily.    [provider]   cloNIDine  (CATAPRES ) 0.2 MG tablet TAKE 1 TABLET(0.2 MG) BY MOUTH TWICE DAILY 07/09/24   Nichols, Tonya S, NP  divalproex  (DEPAKOTE ) 500 MG DR tablet TAKE 1 TABLET(500 MG) BY MOUTH THREE TIMES DAILY 06/10/24   Nichols, Tonya S, NP  dolutegravir  (TIVICAY ) 50 MG tablet Take 1 tablet (50 mg total) by mouth daily. 06/08/24   Oley Bascom RAMAN, NP  gabapentin  (NEURONTIN ) 300 MG capsule Take 1 capsule (300 mg total) by mouth 3 (three) times daily. 06/03/24   Oley Bascom RAMAN, NP  glucose blood (ACCU-CHEK GUIDE TEST) test strip Use as instructed 02/18/24   Gladis Elsie BROCKS, PA-C  insulin  glargine (LANTUS  SOLOSTAR) 100 UNIT/ML Solostar Pen Inject 15 Units into the skin daily. 06/03/24   Oley Bascom RAMAN, NP  Insulin  Pen Needle 31G X 8 MM MISC 1 each by Does not apply route as needed. Use as directed 06/16/24   Oley Bascom RAMAN, NP  Lacosamide  150 MG TABS Take 1 tablet (150 mg total) by mouth 2 (two) times daily. 05/11/24   Oley Bascom RAMAN, NP  lactulose  (CHRONULAC ) 10 GM/15ML solution Take 20 g by mouth 3 (three) times daily after meals. 10/25/22   [provider]  levETIRAcetam  (KEPPRA ) 1000 MG tablet Take 1 tablet (1,000 mg total) by mouth 2 (two) times daily. 06/03/24   Oley Bascom RAMAN, NP  LORazepam  (ATIVAN ) 0.5 MG tablet TAKE 1 TABLET(0.5 MG) BY  MOUTH EVERY 6 HOURS AS NEEDED FOR ANXIETY 12/13/23   Alvia Corean CROME, FNP  metFORMIN  (GLUCOPHAGE ) 500 MG tablet Take 1 tablet (500 mg total) by mouth 2 (two) times daily. 06/03/24   Oley Bascom RAMAN, NP  omeprazole  (PRILOSEC) 40 MG capsule TAKE 1 CAPSULE(40 MG) BY MOUTH DAILY 07/09/24   Nichols, Tonya S, NP  PHENobarbital  (LUMINAL) 64.8 MG tablet Take 1 tablet (64.8 mg total) by mouth 2 (two) times daily. TAKE 1 TABLET(64.8 MG) BY MOUTH TWICE DAILY 05/11/24   Nichols, Tonya S, NP  psyllium (METAMUCIL) 58.6 % powder Take 1 packet by mouth daily.    [provider]  risperiDONE  (RISPERDAL ) 2 MG tablet Take 1 tablet (2 mg total) by mouth at bedtime.  04/27/24   Vivienne Delon HERO, PA-C  rosuvastatin  (CRESTOR ) 20 MG tablet TAKE 1 TABLET(20 MG) BY MOUTH DAILY 07/09/24   Nichols, Tonya S, NP  sertraline  (ZOLOFT ) 100 MG tablet Take 1 tablet (100 mg total) by mouth at bedtime. 04/27/24   Vivienne Delon HERO, PA-C  triamterene -hydrochlorothiazide  (MAXZIDE -25) 37.5-25 MG tablet Take 1 tablet by mouth daily. 06/03/24   Oley Bascom RAMAN, NP  TRIUMEQ  600-50-300 MG tablet TAKE 1 TABLET BY MOUTH EVERY MORNING 07/15/24   Oley Bascom RAMAN, NP    Allergies: Lyrica  [pregabalin ]    Review of Systems  Updated Vital Signs BP (!) 172/76   Pulse 72   Temp 98.2 F (36.8 C) (Oral)   Resp (!) 21   Ht 5' 2 (1.575 m)   Wt 118.8 kg   LMP  (LMP Unknown) Comment: neg hcg 07/20/20  SpO2 97%   BMI 47.92 kg/m   Physical Exam Constitutional:      General: She is not in acute distress.    Appearance: She is obese.  HENT:     Head: Normocephalic and atraumatic.  Eyes:     Conjunctiva/sclera: Conjunctivae normal.     Pupils: Pupils are equal, round, and reactive to light.  Cardiovascular:     Rate and Rhythm: Normal rate and regular rhythm.  Pulmonary:     Effort: Pulmonary effort is normal. No respiratory distress.  Abdominal:     General: There is no distension.     Tenderness: There is no abdominal tenderness.  Skin:    General: Skin is warm and dry.  Neurological:     General: No focal deficit present.     Mental Status: She is alert. Mental status is at baseline.  Psychiatric:        Mood and Affect: Mood normal.        Behavior: Behavior normal.     (all labs ordered are listed, but only abnormal results are displayed) Labs Reviewed  CBC WITH DIFFERENTIAL/PLATELET - Abnormal; Notable for the following components:      Result Value   Abs Immature Granulocytes 0.10 (*)    All other components within normal limits  COMPREHENSIVE METABOLIC PANEL WITH GFR - Abnormal; Notable for the following components:   Creatinine, Ser 1.05 (*)     Calcium  8.3 (*)    Total Protein 6.1 (*)    Albumin 2.8 (*)    All other components within normal limits  URINALYSIS, ROUTINE W REFLEX MICROSCOPIC - Abnormal; Notable for the following components:   Specific Gravity, Urine <1.005 (*)    Hgb urine dipstick MODERATE (*)    Leukocytes,Ua TRACE (*)    All other components within normal limits  URINALYSIS, MICROSCOPIC (REFLEX) - Abnormal; Notable for the following components:  Bacteria, UA RARE (*)    All other components within normal limits  RESP PANEL BY RT-PCR (RSV, FLU A&B, COVID)  RVPGX2  CULTURE, BLOOD (ROUTINE X 2)  CULTURE, BLOOD (ROUTINE X 2)  CBG MONITORING, ED  I-STAT CG4 LACTIC ACID, ED    EKG: EKG Interpretation Date/Time:  Saturday August 01 2024 21:29:08 EST Ventricular Rate:  70 PR Interval:  153 QRS Duration:  92 QT Interval:  396 QTC Calculation: 428 R Axis:   65  Text Interpretation: Sinus rhythm Confirmed by Cottie Cough [id number]) on 08/01/2024 9:39:45 PM  Radiology: CT Chest Wo Contrast Result Date: 08/01/2024 CLINICAL DATA:  Pneumonia EXAM: CT CHEST WITHOUT CONTRAST TECHNIQUE: Multidetector CT imaging of the chest was performed following the standard protocol without IV contrast. RADIATION DOSE REDUCTION: This exam was performed according to the departmental dose-optimization program which includes automated exposure control, adjustment of the mA and/or kV according to patient size and/or use of iterative reconstruction technique. COMPARISON:  Chest x-ray same day. CT chest abdomen and pelvis 07/20/2020. FINDINGS: Cardiovascular: Heart is mildly enlarged. Aorta is normal in size. There is no pericardial effusion. There are atherosclerotic calcifications of the aorta and coronary arteries. Aberrant right subclavian artery again noted. Mediastinum/Nodes: Left thyroid  nodule measures up to 2.1 cm and has increased in size. There are no enlarged mediastinal or hilar lymph nodes allowing for lack of intravenous  contrast. Visualized esophagus is within normal limits. Lungs/Pleura: There is central peribronchial wall thickening bilaterally. There is no focal lung infiltrate. There are mild patchy ground-glass opacities in the right upper lobe. There is an tree-in-bud and ill-defined ground-glass opacities in the central left lower lobe. There is no pleural effusion or pneumothorax. Upper Abdomen: Gallstones are present. Musculoskeletal: There is DISH of the thoracic spine. IMPRESSION: 1. Mild patchy ground-glass opacities in the right upper lobe and left lower lobe worrisome for infection. 2. Central peribronchial wall thickening compatible with bronchitis. 3. Cholelithiasis. 4. Left thyroid  nodule has increased in size now measuring 2.1 cm Recommend thyroid  US  (ref: J Am Coll Radiol. 2015 Feb;12(2): 143-50). (Ref: J Am Coll Radiol. 2015 Feb;12(2): 143-50). 5. Aortic atherosclerosis. Aortic Atherosclerosis (ICD10-I70.0). Electronically Signed   By: Greig Pique M.D.   On: 08/01/2024 22:54   DG Chest Portable 1 View Result Date: 08/01/2024 EXAM: 1 VIEW(S) XRAY OF THE CHEST 08/01/2024 10:23:50 PM COMPARISON: 02/18/2024 CLINICAL HISTORY: SOB / Cough FINDINGS: LUNGS AND PLEURA: Low lung volumes. No focal pulmonary opacity. No pleural effusion. No pneumothorax. HEART AND MEDIASTINUM: Calcified aorta. No acute abnormality of the cardiac and mediastinal silhouettes. BONES AND SOFT TISSUES: Thoracic degenerative changes. No acute osseous abnormality. IMPRESSION: 1. No acute cardiopulmonary findings. Electronically signed by: Norman Gatlin MD 08/01/2024 10:30 PM EST RP Workstation: [id number]     Procedures   Medications Ordered in the ED  cefTRIAXone  (ROCEPHIN ) 1 g in sodium chloride  0.9 % 100 mL IVPB (1 g Intravenous New Bag/Given 08/01/24 2316)  doxycycline  (VIBRA -TABS) tablet 100 mg (100 mg Oral Given 08/01/24 2313)    Clinical Course as of 08/01/24 2325  Sat Aug 01, 2024  2321 Admitted to hospital [MT]     Clinical Course User Index [MT] Cottie Cough PARAS, MD                                 Medical Decision Making Amount and/or Complexity of Data Reviewed Labs: ordered. Radiology: ordered.  Risk Prescription drug management. Decision regarding  hospitalization.   This patient presents to the ED with concern for fever, feeling unwell. This involves an extensive number of treatment options, and is a complaint that carries with it a high risk of complications and morbidity.  The differential diagnosis includes infection including UTI versus pulmonary faction versus viral URI or COVID-19 (her daughter was also sick with respiratory symptoms last week) versus other  I do not see evidence of actual seizure activity on exam.  She has some occasional twitching of her jaw, but it is not lipsmacking, she is cognizant and awake during all of this.  This appears to be more of a spasm.  Co-morbidities that complicate the patient evaluation: History of HIV  Additional history obtained from EMS, family member daughter at bedside  I ordered and personally interpreted labs.  The pertinent results include: No emergent findings  I ordered imaging studies including x-ray of the chest, CT chest I independently visualized and interpreted imaging which showed pneumonia I agree with the radiologist interpretation  The patient was maintained on a cardiac monitor.  I personally viewed and interpreted the cardiac monitored which showed an underlying rhythm of: Sinus rhythm  Per my interpretation the patient's ECG shows no acute ischemic findings  I ordered medication including antibiotics for community pneumonia  I have reviewed the patients home medicines and have made adjustments as needed  Test Considered: Doubt acute PE, doubt sepsis with normal lactate and white blood cell count.  No indication for large-volume resuscitation   After the interventions noted above, I reevaluated the patient and found  that they have: stayed the same   Dispostion:  After consideration of the diagnostic results and the patients response to treatment, I feel that the patent would benefit from admission.      Final diagnoses:  Pneumonia due to infectious organism, unspecified laterality, unspecified part of lung    ED Discharge Orders     None          Jonie Burdell, Donnice PARAS, MD 08/01/24 2325

## 2024-08-01 NOTE — ED Notes (Signed)
 IV team consult ordered , unable to establish peripheral IV access despite multiple attempts .

## 2024-08-01 NOTE — ED Triage Notes (Signed)
 PT BIB [facility] from home. EMS was called out for sz like activity with reports of it lasting 10-15 minutes. Family noted pt had a tremor in her mouth. Family reports pt has had increased weakness and new cough over last 2 days and developed a fever today TMax 101.2. Her daughter has been giving 1,000 mg of APAP every 4-6 hours with the last dose tonight at 1800. Pt is oriented to self only at baseline.   EMS Vitals  HR 90 174/82 CBG 121 SpO2 96%

## 2024-08-01 NOTE — Hospital Course (Addendum)
 [name] is a 63 year old woman with HIV (undetectable), T2DM, HTN, HLD, CHF, epilepsy on multiple AEDs, schizoaffective disorder, and prior CVA with chronic left-sided weakness, presenting with 2 days of fever, worsening cough, and generalized weakness.  Her daughter reports decreased appetite, a witnessed focal seizure (staring + jaw twitching), and inability to transfer herself for the past 2 days. She lives with her daughter and is wheelchair-dependent at baseline.  In the ED, she was afebrile, BP 163/72, satting well on room air. Labs without leukocytosis. BMP notable for Cr 1.05 (up from baseline 0.6-0.7) and albumin 2.8. UA with trace leukocytes.  CXR was clear, but CT chest showed multifocal mild ground-glass opacities in the RUL and LLL consistent with pneumonia, along with bronchitis. She received ceftriaxone  + doxycycline .  Exam: alert, no respiratory distress, dry mucous membranes, wheezes in upper lobes and rales at bases (L>R). Chronic left-sided weakness.  Today: wbc: 6.6, k: 3.4 repleted  Assessment: Multifocal pneumonia in an immunocompromised host (HIV, though suppressed) with functional decline. Seizure likely provoked by fever/infection.  Plan: Switch to ceftriaxone  + azithromycin  RVP, sputum culture, Legionella/strep pending: strep pneumo, covid, flu negative Breo Ellipta, flutter valve, IS Continue home AEDs: Keppra , phenobarb, lacosamide ; monitor for further seizures Moderate SSI + nighttime coverage; hold metformin  for now Continue HIV meds; viral load pending Resume home HTN meds; consider aspirin  for prior CVA Nicotine  patch for smoking cessation Thyroid  nodule follow-up outpatient Full code; carb-modified diet Anticipate discharge home with daughter when stable                             Pneumonia, morbidly obese with HIV that she says is well controlled. Daughter states she has been running a fever at home, temps up to  102. Rough cough that she has noticed. Wasn't mentating normally, sort of loopy with speech. Blood work looked ok, COVID test was negative. Infiltrates consistent with pneumonia. Age and comorbities with confsuion       Able to state her name and her daughter. Lives with her daughter. Thinks she's got pneumonia. Was sick at home, was coughing as well, a lot even. Still smokes a lot. Started about two days ago, has been coughing up mucus and phlegm. Gave her cough medication, tylenol , and saw her mouth kind of twitching, and fever running high. Last one at home was checked was 101.2. Has been trying theraflu as well. Breakfast she ate which was oatmeal, didn't eat toast. Last couple days has had a decreased appetite. No vomiting, no spitting up. States that her belly hurts. States from the coughing it makes it hurt. Great bowel movements. After she had a stroke in 2021, left her with left sided deficits. Appears to be a bout baseline. Smokes about hafl a pack a day, down from 1.5 packs. When she coughs her stomach hurt, may have had some chest plerutiic pain. Feels like she has been kinda weak, uses rollator to go to bathroom and walk. Feels like she has been weaker, and falling asleep. No blood in urine, states she has been having some pain with urination. Has her own bathroom, has been taking her own medications.  [place] herself is getting over pneumonia.   Had a seizure earlier today, stopped had a long stare at her daughter and her mouth started shaking. Nromally it's like thsi as well.    Social:  - Lives with daughter who helps with everything - Doesn't drin kalcohol

## 2024-08-02 ENCOUNTER — Encounter (HOSPITAL_COMMUNITY): Payer: Self-pay

## 2024-08-02 DIAGNOSIS — J189 Pneumonia, unspecified organism: Secondary | ICD-10-CM

## 2024-08-02 DIAGNOSIS — Z792 Long term (current) use of antibiotics: Secondary | ICD-10-CM

## 2024-08-02 DIAGNOSIS — E785 Hyperlipidemia, unspecified: Secondary | ICD-10-CM

## 2024-08-02 DIAGNOSIS — I509 Heart failure, unspecified: Secondary | ICD-10-CM

## 2024-08-02 DIAGNOSIS — I69354 Hemiplegia and hemiparesis following cerebral infarction affecting left non-dominant side: Secondary | ICD-10-CM

## 2024-08-02 DIAGNOSIS — F17210 Nicotine dependence, cigarettes, uncomplicated: Secondary | ICD-10-CM | POA: Diagnosis not present

## 2024-08-02 DIAGNOSIS — E119 Type 2 diabetes mellitus without complications: Secondary | ICD-10-CM

## 2024-08-02 DIAGNOSIS — J188 Other pneumonia, unspecified organism: Secondary | ICD-10-CM | POA: Diagnosis not present

## 2024-08-02 DIAGNOSIS — F411 Generalized anxiety disorder: Secondary | ICD-10-CM

## 2024-08-02 DIAGNOSIS — G40909 Epilepsy, unspecified, not intractable, without status epilepticus: Secondary | ICD-10-CM

## 2024-08-02 DIAGNOSIS — I110 Hypertensive heart disease with heart failure: Secondary | ICD-10-CM

## 2024-08-02 DIAGNOSIS — F259 Schizoaffective disorder, unspecified: Secondary | ICD-10-CM

## 2024-08-02 DIAGNOSIS — B20 Human immunodeficiency virus [HIV] disease: Secondary | ICD-10-CM

## 2024-08-02 DIAGNOSIS — Z794 Long term (current) use of insulin: Secondary | ICD-10-CM

## 2024-08-02 DIAGNOSIS — Z7984 Long term (current) use of oral hypoglycemic drugs: Secondary | ICD-10-CM

## 2024-08-02 LAB — HIV ANTIBODY (ROUTINE TESTING W REFLEX): HIV Screen 4th Generation wRfx: REACTIVE — AB

## 2024-08-02 LAB — GLUCOSE, CAPILLARY
Glucose-Capillary: 50 mg/dL — ABNORMAL LOW (ref 70–99)
Glucose-Capillary: 65 mg/dL — ABNORMAL LOW (ref 70–99)
Glucose-Capillary: 154 mg/dL — ABNORMAL HIGH (ref 70–99)
Glucose-Capillary: 152 mg/dL — ABNORMAL HIGH (ref 70–99)
Glucose-Capillary: 126 mg/dL — ABNORMAL HIGH (ref 70–99)
Glucose-Capillary: 98 mg/dL (ref 70–99)
Glucose-Capillary: 122 mg/dL — ABNORMAL HIGH (ref 70–99)
Glucose-Capillary: 164 mg/dL — ABNORMAL HIGH (ref 70–99)

## 2024-08-02 LAB — BASIC METABOLIC PANEL WITH GFR
Anion gap: 12 (ref 5–15)
BUN: 14 mg/dL (ref 8–23)
CO2: 27 mmol/L (ref 22–32)
Calcium: 8.1 mg/dL — ABNORMAL LOW (ref 8.9–10.3)
Chloride: 97 mmol/L — ABNORMAL LOW (ref 98–111)
Creatinine, Ser: 0.87 mg/dL (ref 0.44–1.00)
GFR, Estimated: >60 mL/min (ref >60)
Glucose, Bld: 104 mg/dL — ABNORMAL HIGH (ref 70–99)
Potassium: 3.4 mmol/L — ABNORMAL LOW (ref 3.5–5.1)
Sodium: 136 mmol/L (ref 135–145)

## 2024-08-02 LAB — CBC
HCT: 38.5 % (ref 36.0–46.0)
Hemoglobin: 13.2 g/dL (ref 12.0–15.0)
MCH: 33.1 pg (ref 26.0–34.0)
MCHC: 34.3 g/dL (ref 30.0–36.0)
MCV: 96.5 fL (ref 80.0–100.0)
Platelets: 179 K/uL (ref 150–400)
RBC: 3.99 MIL/uL (ref 3.87–5.11)
RDW: 14.6 % (ref 11.5–15.5)
WBC: 6.6 K/uL (ref 4.0–10.5)
nRBC: 0 % (ref 0.0–0.2)

## 2024-08-02 LAB — STREP PNEUMONIAE URINARY ANTIGEN: Strep Pneumo Urinary Antigen: NEGATIVE

## 2024-08-02 MED ORDER — INSULIN ASPART 100 UNIT/ML IJ SOLN: 0.0000 [IU] | Freq: Three times a day (TID) | INTRAMUSCULAR | Status: DC

## 2024-08-02 MED ORDER — SODIUM CHLORIDE 0.9 % IV SOLN
2.0000 g | INTRAVENOUS | Status: DC
  Administered 2024-08-02 – 2024-08-03 (×2): 2 g via INTRAVENOUS
  Filled 2024-08-02 (×2): qty 20

## 2024-08-02 MED ORDER — POLYETHYLENE GLYCOL 3350 17 G PO PACK: 17.0000 g | PACK | Freq: Every day | ORAL | Status: DC | PRN

## 2024-08-02 MED ORDER — GUAIFENESIN ER 600 MG PO TB12: 600.0000 mg | ORAL_TABLET | Freq: Two times a day (BID) | ORAL | Status: DC

## 2024-08-02 MED ORDER — POTASSIUM CHLORIDE CRYS ER 20 MEQ PO TBCR
40.0000 meq | EXTENDED_RELEASE_TABLET | Freq: Once | ORAL | Status: AC
  Administered 2024-08-02: 40 meq via ORAL
  Filled 2024-08-02: qty 2

## 2024-08-02 MED ORDER — DIVALPROEX SODIUM 250 MG PO DR TAB
500.0000 mg | DELAYED_RELEASE_TABLET | Freq: Three times a day (TID) | ORAL | Status: DC
  Administered 2024-08-02 – 2024-08-03 (×5): 500 mg via ORAL
  Filled 2024-08-02 (×7): qty 2

## 2024-08-02 MED ORDER — GABAPENTIN 300 MG PO CAPS
300.0000 mg | ORAL_CAPSULE | Freq: Three times a day (TID) | ORAL | Status: DC
  Administered 2024-08-02 – 2024-08-03 (×5): 300 mg via ORAL
  Filled 2024-08-02 (×5): qty 1

## 2024-08-02 MED ORDER — AZITHROMYCIN 500 MG PO TABS
500.0000 mg | ORAL_TABLET | Freq: Every day | ORAL | Status: DC
  Administered 2024-08-02 – 2024-08-03 (×2): 500 mg via ORAL
  Filled 2024-08-02 (×2): qty 1

## 2024-08-02 MED ORDER — LACOSAMIDE 50 MG PO TABS
150.0000 mg | ORAL_TABLET | Freq: Two times a day (BID) | ORAL | Status: DC
  Administered 2024-08-02 – 2024-08-03 (×4): 150 mg via ORAL
  Filled 2024-08-02 (×4): qty 3

## 2024-08-02 MED ORDER — IPRATROPIUM-ALBUTEROL 0.5-2.5 (3) MG/3ML IN SOLN
3.0000 mL | Freq: Two times a day (BID) | RESPIRATORY_TRACT | Status: DC
  Administered 2024-08-03: 3 mL via RESPIRATORY_TRACT
  Filled 2024-08-02: qty 3

## 2024-08-02 MED ORDER — DOLUTEGRAVIR SODIUM 50 MG PO TABS
50.0000 mg | ORAL_TABLET | Freq: Every day | ORAL | Status: DC
  Administered 2024-08-02 (×2): 50 mg via ORAL
  Filled 2024-08-02 (×3): qty 1

## 2024-08-02 MED ORDER — GUAIFENESIN ER 600 MG PO TB12
600.0000 mg | ORAL_TABLET | Freq: Two times a day (BID) | ORAL | Status: DC
  Administered 2024-08-02 – 2024-08-03 (×3): 600 mg via ORAL
  Filled 2024-08-02 (×3): qty 1

## 2024-08-02 MED ORDER — TRIAMTERENE-HCTZ 37.5-25 MG PO TABS
1.0000 | ORAL_TABLET | Freq: Every day | ORAL | Status: DC
  Administered 2024-08-02 – 2024-08-03 (×2): 1 via ORAL
  Filled 2024-08-02 (×2): qty 1

## 2024-08-02 MED ORDER — ROSUVASTATIN CALCIUM 20 MG PO TABS
20.0000 mg | ORAL_TABLET | Freq: Every day | ORAL | Status: DC
  Administered 2024-08-02 – 2024-08-03 (×2): 20 mg via ORAL
  Filled 2024-08-02 (×2): qty 1

## 2024-08-02 MED ORDER — PHENOBARBITAL 32.4 MG PO TABS
64.8000 mg | ORAL_TABLET | Freq: Two times a day (BID) | ORAL | Status: DC
  Administered 2024-08-02 – 2024-08-03 (×4): 64.8 mg via ORAL
  Filled 2024-08-02 (×4): qty 2

## 2024-08-02 MED ORDER — FLUTICASONE FUROATE-VILANTEROL 100-25 MCG/ACT IN AEPB
1.0000 | INHALATION_SPRAY | Freq: Every day | RESPIRATORY_TRACT | Status: DC
  Administered 2024-08-02 – 2024-08-03 (×2): 1 via RESPIRATORY_TRACT
  Filled 2024-08-02: qty 28

## 2024-08-02 MED ORDER — LEVETIRACETAM 500 MG PO TABS
1000.0000 mg | ORAL_TABLET | Freq: Two times a day (BID) | ORAL | Status: DC
  Administered 2024-08-02 – 2024-08-03 (×4): 1000 mg via ORAL
  Filled 2024-08-02 (×4): qty 2

## 2024-08-02 MED ORDER — AMLODIPINE BESYLATE 5 MG PO TABS
10.0000 mg | ORAL_TABLET | Freq: Every day | ORAL | Status: DC
  Administered 2024-08-02 – 2024-08-03 (×2): 10 mg via ORAL
  Filled 2024-08-02 (×2): qty 2

## 2024-08-02 MED ORDER — PANTOPRAZOLE SODIUM 40 MG PO TBEC
80.0000 mg | DELAYED_RELEASE_TABLET | Freq: Every day | ORAL | Status: DC
  Administered 2024-08-02 – 2024-08-03 (×2): 80 mg via ORAL
  Filled 2024-08-02 (×2): qty 2

## 2024-08-02 MED ORDER — ABACAVIR-DOLUTEGRAVIR-LAMIVUD 600-50-300 MG PO TABS
1.0000 | ORAL_TABLET | Freq: Every morning | ORAL | Status: DC
  Administered 2024-08-02 – 2024-08-03 (×2): 1 via ORAL
  Filled 2024-08-02 (×2): qty 1

## 2024-08-02 MED ORDER — BUDESON-GLYCOPYRROL-FORMOTEROL 160-9-4.8 MCG/ACT IN AERO: 2.0000 | INHALATION_SPRAY | Freq: Two times a day (BID) | RESPIRATORY_TRACT | Status: DC

## 2024-08-02 MED ORDER — CLONIDINE HCL 0.1 MG PO TABS
0.2000 mg | ORAL_TABLET | Freq: Two times a day (BID) | ORAL | Status: DC
  Administered 2024-08-02 – 2024-08-03 (×3): 0.2 mg via ORAL
  Filled 2024-08-02 (×3): qty 2

## 2024-08-02 MED ORDER — SODIUM CHLORIDE 0.9 % IV SOLN: 500.0000 mg | INTRAVENOUS | Status: DC

## 2024-08-02 MED ORDER — IPRATROPIUM-ALBUTEROL 0.5-2.5 (3) MG/3ML IN SOLN
3.0000 mL | RESPIRATORY_TRACT | Status: DC
  Administered 2024-08-02 (×4): 3 mL via RESPIRATORY_TRACT
  Filled 2024-08-02 (×4): qty 3

## 2024-08-02 MED ORDER — INSULIN ASPART 100 UNIT/ML IJ SOLN: 0.0000 [IU] | Freq: Every day | INTRAMUSCULAR | Status: DC

## 2024-08-02 NOTE — Plan of Care (Signed)
 Problem: Education: Goal: Knowledge of General Education information will improve Description: Including pain rating scale, medication(s)/side effects and non-pharmacologic comfort measures 08/02/2024 1706 by Annabella Oxford, RN Outcome: Progressing 08/02/2024 1706 by Annabella Oxford, RN Outcome: Progressing   Problem: Health Behavior/Discharge Planning: Goal: Ability to manage health-related needs will improve 08/02/2024 1706 by Annabella Oxford, RN Outcome: Progressing 08/02/2024 1706 by Annabella Oxford, RN Outcome: Progressing   Problem: Clinical Measurements: Goal: Ability to maintain clinical measurements within normal limits will improve 08/02/2024 1706 by Annabella Oxford, RN Outcome: Progressing 08/02/2024 1706 by Annabella Oxford, RN Outcome: Progressing Goal: Will remain free from infection 08/02/2024 1706 by Annabella Oxford, RN Outcome: Progressing 08/02/2024 1706 by Annabella Oxford, RN Outcome: Progressing Goal: Diagnostic test results will improve 08/02/2024 1706 by Annabella Oxford, RN Outcome: Progressing 08/02/2024 1706 by Annabella Oxford, RN Outcome: Progressing Goal: Respiratory complications will improve 08/02/2024 1706 by Annabella Oxford, RN Outcome: Progressing 08/02/2024 1706 by Annabella Oxford, RN Outcome: Progressing Goal: Cardiovascular complication will be avoided 08/02/2024 1706 by Annabella Oxford, RN Outcome: Progressing 08/02/2024 1706 by Annabella Oxford, RN Outcome: Progressing   Problem: Activity: Goal: Risk for activity intolerance will decrease 08/02/2024 1706 by Annabella Oxford, RN Outcome: Progressing 08/02/2024 1706 by Annabella Oxford, RN Outcome: Progressing   Problem: Nutrition: Goal: Adequate nutrition will be maintained 08/02/2024 1706 by Annabella Oxford, RN Outcome: Progressing 08/02/2024 1706 by Annabella Oxford, RN Outcome: Progressing   Problem: Coping: Goal: Level of anxiety will decrease 08/02/2024 1706 by Annabella Oxford, RN Outcome:  Progressing 08/02/2024 1706 by Annabella Oxford, RN Outcome: Progressing   Problem: Elimination: Goal: Will not experience complications related to bowel motility 08/02/2024 1706 by Annabella Oxford, RN Outcome: Progressing 08/02/2024 1706 by Annabella Oxford, RN Outcome: Progressing Goal: Will not experience complications related to urinary retention 08/02/2024 1706 by Annabella Oxford, RN Outcome: Progressing 08/02/2024 1706 by Annabella Oxford, RN Outcome: Progressing   Problem: Pain Managment: Goal: General experience of comfort will improve and/or be controlled 08/02/2024 1706 by Annabella Oxford, RN Outcome: Progressing 08/02/2024 1706 by Annabella Oxford, RN Outcome: Progressing   Problem: Safety: Goal: Ability to remain free from injury will improve 08/02/2024 1706 by Annabella Oxford, RN Outcome: Progressing 08/02/2024 1706 by Annabella Oxford, RN Outcome: Progressing   Problem: Skin Integrity: Goal: Risk for impaired skin integrity will decrease 08/02/2024 1706 by Annabella Oxford, RN Outcome: Progressing 08/02/2024 1706 by Annabella Oxford, RN Outcome: Progressing   Problem: Education: Goal: Ability to describe self-care measures that may prevent or decrease complications (Diabetes Survival Skills Education) will improve 08/02/2024 1706 by Annabella Oxford, RN Outcome: Progressing 08/02/2024 1706 by Annabella Oxford, RN Outcome: Progressing Goal: Individualized Educational Video(s) 08/02/2024 1706 by Annabella Oxford, RN Outcome: Progressing 08/02/2024 1706 by Annabella Oxford, RN Outcome: Progressing   Problem: Coping: Goal: Ability to adjust to condition or change in health will improve 08/02/2024 1706 by Annabella Oxford, RN Outcome: Progressing 08/02/2024 1706 by Annabella Oxford, RN Outcome: Progressing   Problem: Fluid Volume: Goal: Ability to maintain a balanced intake and output will improve 08/02/2024 1706 by Annabella Oxford, RN Outcome: Progressing 08/02/2024 1706 by  Annabella Oxford, RN Outcome: Progressing   Problem: Health Behavior/Discharge Planning: Goal: Ability to identify and utilize available resources and services will improve 08/02/2024 1706 by Annabella Oxford, RN Outcome: Progressing 08/02/2024 1706 by Annabella Oxford, RN Outcome: Progressing Goal: Ability to manage health-related needs will improve 08/02/2024 1706 by Annabella Oxford, RN Outcome: Progressing 08/02/2024 1706 by Annabella Oxford, RN Outcome: Progressing   Problem: Metabolic: Goal: Ability to maintain appropriate glucose  levels will improve 08/02/2024 1706 by Annabella Oxford, RN Outcome: Progressing 08/02/2024 1706 by Annabella Oxford, RN Outcome: Progressing   Problem: Nutritional: Goal: Maintenance of adequate nutrition will improve 08/02/2024 1706 by Annabella Oxford, RN Outcome: Progressing 08/02/2024 1706 by Annabella Oxford, RN Outcome: Progressing Goal: Progress toward achieving an optimal weight will improve 08/02/2024 1706 by Annabella Oxford, RN Outcome: Progressing 08/02/2024 1706 by Annabella Oxford, RN Outcome: Progressing   Problem: Skin Integrity: Goal: Risk for impaired skin integrity will decrease 08/02/2024 1706 by Annabella Oxford, RN Outcome: Progressing 08/02/2024 1706 by Annabella Oxford, RN Outcome: Progressing   Problem: Tissue Perfusion: Goal: Adequacy of tissue perfusion will improve 08/02/2024 1706 by Annabella Oxford, RN Outcome: Progressing 08/02/2024 1706 by Annabella Oxford, RN Outcome: Progressing
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
## 2024-08-02 NOTE — ED Notes (Signed)
 [facility] notified that patient is on her way to the unit.

## 2024-08-02 NOTE — Plan of Care (Signed)

## 2024-08-02 NOTE — Progress Notes (Signed)
 HD#0 SUBJECTIVE:  Patient Summary: Susan [name] is a 63 year old woman with HIV (undetectable), epilepsy, prior stroke with left-sided deficits, CHF, T2DM, HTN, schizoaffective disorder, and tobacco use who presents with 2 days of fever, cough, and weakness, now admitted for multifocal pneumonia on CT.  Overnight Events: NAEON  Interim History: She was sleeping comfortably this morning. When awakened, she was pleasant but confused, oriented only to herself. No family was at the bedside for additional history. On observation, she did not cough and had no audible wheezing or signs of acute respiratory distress.  OBJECTIVE:  Vital Signs: Vitals:   08/02/24 0032 08/02/24 0439 08/02/24 0808 08/02/24 0851  BP: (!) 154/75 (!) 147/84 112/79   Pulse: 78 74 72 70  Resp: 18 19 18 16   Temp: 97.9 F (36.6 C) 98.7 F (37.1 C) 98.9 F (37.2 C)   TempSrc: Oral Oral Oral   SpO2: 91% 92% 96%   Weight:      Height:       Supplemental O2: Room Air SpO2: 96 %  Filed Weights   08/01/24 2124  Weight: 118.8 kg     Intake/Output Summary (Last 24 hours) at 08/02/2024 1016 Last data filed at 08/02/2024 0400 Gross per 24 hour  Intake 100 ml  Output --  Net 100 ml   Net IO Since Admission: 100 mL [08/02/24 1016]  Physical Exam: Physical Exam Constitutional: Pleasant woman resting in bed, no acute distress. HENT: Normocephalic, atraumatic; mucous membranes dry. Eyes: Non-erythematous conjunctiva; right eye closed. Neck: Supple. Cardiovascular: Regular rate and rhythm; no murmurs, rubs, or gallops; no peripheral edema. Pulmonary: Normal work of breathing on room air; wheezing in bilateral upper lobes. Abdomen: Soft, non-tender, non-distended. MSK: Normal muscle bulk and tone. Neurological: Alert; strength intact in all extremities, with baseline left-sided weakness (right > left, most prominent in upper extremities). Skin: Warm, dry, with normal turgor. Psych: Calm, cooperative; no  evidence of hallucinations or internal preoccupation. Patient Lines/Drains/Airways Status     Active Line/Drains/Airways     Name Placement date Placement time Site Days   Peripheral IV 08/01/24 22 G 2.5 Anterior;Distal;Left;Upper Arm 08/01/24  2154  Arm  1            Pertinent labs and imaging:      Latest Ref Rng & Units 08/02/2024    6:12 AM 08/01/2024    9:50 PM 02/19/2024    1:22 AM  CBC  WBC 4.0 - 10.5 K/uL 6.6  6.8  6.9   Hemoglobin 12.0 - 15.0 g/dL 86.7  86.8  86.2   Hematocrit 36.0 - 46.0 % 38.5  40.0  41.9   Platelets 150 - 400 K/uL 179  198  181        Latest Ref Rng & Units 08/02/2024    4:41 AM 08/01/2024    9:50 PM 02/19/2024    1:22 AM  CMP  Glucose 70 - 99 mg/dL 895  79  82   BUN 8 - 23 mg/dL 14  15  16    Creatinine 0.44 - 1.00 mg/dL 9.12  8.94  9.10   Sodium 135 - 145 mmol/L 136  139  142   Potassium 3.5 - 5.1 mmol/L 3.4  3.9  3.7   Chloride 98 - 111 mmol/L 97  99  102   CO2 22 - 32 mmol/L 27  29  27    Calcium  8.9 - 10.3 mg/dL 8.1  8.3  8.8   Total Protein 6.5 - 8.1 g/dL  6.1  6.1   Total Bilirubin 0.0 - 1.2 mg/dL  0.2  0.3   Alkaline Phos 38 - 126 U/L  92  119   AST 15 - 41 U/L  15  16   ALT 0 - 44 U/L  15  13     CT Chest Wo Contrast Result Date: 08/01/2024 CLINICAL DATA:  Pneumonia EXAM: CT CHEST WITHOUT CONTRAST TECHNIQUE: Multidetector CT imaging of the chest was performed following the standard protocol without IV contrast. RADIATION DOSE REDUCTION: This exam was performed according to the departmental dose-optimization program which includes automated exposure control, adjustment of the mA and/or kV according to patient size and/or use of iterative reconstruction technique. COMPARISON:  Chest x-ray same day. CT chest abdomen and pelvis 07/20/2020. FINDINGS: Cardiovascular: Heart is mildly enlarged. Aorta is normal in size. There is no pericardial effusion. There are atherosclerotic calcifications of the aorta and coronary arteries. Aberrant  right subclavian artery again noted. Mediastinum/Nodes: Left thyroid  nodule measures up to 2.1 cm and has increased in size. There are no enlarged mediastinal or hilar lymph nodes allowing for lack of intravenous contrast. Visualized esophagus is within normal limits. Lungs/Pleura: There is central peribronchial wall thickening bilaterally. There is no focal lung infiltrate. There are mild patchy ground-glass opacities in the right upper lobe. There is an tree-in-bud and ill-defined ground-glass opacities in the central left lower lobe. There is no pleural effusion or pneumothorax. Upper Abdomen: Gallstones are present. Musculoskeletal: There is DISH of the thoracic spine. IMPRESSION: 1. Mild patchy ground-glass opacities in the right upper lobe and left lower lobe worrisome for infection. 2. Central peribronchial wall thickening compatible with bronchitis. 3. Cholelithiasis. 4. Left thyroid  nodule has increased in size now measuring 2.1 cm Recommend thyroid  US  (ref: J Am Coll Radiol. 2015 Feb;12(2): 143-50). (Ref: J Am Coll Radiol. 2015 Feb;12(2): 143-50). 5. Aortic atherosclerosis. Aortic Atherosclerosis (ICD10-I70.0). Electronically Signed   By: Greig Pique M.D.   On: 08/01/2024 22:54   DG Chest Portable 1 View Result Date: 08/01/2024 EXAM: 1 VIEW(S) XRAY OF THE CHEST 08/01/2024 10:23:50 PM COMPARISON: 02/18/2024 CLINICAL HISTORY: SOB / Cough FINDINGS: LUNGS AND PLEURA: Low lung volumes. No focal pulmonary opacity. No pleural effusion. No pneumothorax. HEART AND MEDIASTINUM: Calcified aorta. No acute abnormality of the cardiac and mediastinal silhouettes. BONES AND SOFT TISSUES: Thoracic degenerative changes. No acute osseous abnormality. IMPRESSION: 1. No acute cardiopulmonary findings. Electronically signed by: Norman Gatlin MD 08/01/2024 10:30 PM EST RP Workstation: [id number]    ASSESSMENT/PLAN:  Assessment: Active Problems:   Multifocal pneumonia   Plan: 63 year old woman with HIV  (undetectable), epilepsy with recent breakthrough seizure, prior CVA with left-sided deficits, CHF, HTN, T2DM, schizoaffective disorder, chronic tobacco use, and GERD who presents with 2 days of fever, cough, and weakness, now admitted for multifocal pneumonia and treated with ceftriaxone /azithromycin  plus supportive therapy (DuoNeb, Mucinex , airway clearance).  #Multifocal pneumonia CT shows mild patchy ground-glass opacities in the RUL and LLL concerning for infection. WBC 6.8, afebrile, hemodynamically stable, RR 18, saturating well on room air. She reports worsening cough and intermittent fevers for 48 hours despite Tylenol . Daughter recently had pneumonia. Patient is a chronic smoker. No respiratory distress on exam, but she has upper-lobe wheezing and lower-lobe rales (L>R). She received ceftriaxone  and doxycycline  in the ED; will continue ceftriaxone  and switch to azithromycin . Supportive therapy added today: DuoNeb given, Mucinex  started, and chest physiotherapy considered for secretion mobilization. Plan: - Continue ceftriaxone  + azithromycin  - Pending: expanded respiratory panel, sputum culture, Legionella, Strep  pneumo, covid, flu: negative - Incentive spirometry - Breo Ellipta started here - Flutter valve - DuoNeb Scheduled  - Mucinex  600 mg BID - Chest physiotherapy if cough remains ineffective - CBC/BMP in the morning  #Epilepsy Daughter reports seizure a few hours prior (staring and jaw twitching, consistent with prior events). Likely triggered by fever/infection. Continue home regimen. Plan: Continue levetiracetam  1000 mg BID Continue phenobarbital  64.8 mg BID Continue lacosamide  150 mg BID Monitor for further events  #Type 2 diabetes mellitus CBG 75 on arrival; decreased oral intake over past 2 days. On metformin  and insulin  at home; recent referral to endocrinology. Plan: - DC SSI - Monitor CBGs closely  #HIV Last viral load (01/2023) undetectable. Follows with ID.  Continue home medications; repeat viral load pending. Plan: - Dolutegravir  50 mg qHS - Abacavir -dolutegravir -lamivudine  600-50-300 mg each morning Follow viral load  #Hypertension / Hyperlipidemia / Prior CVA with deficits BP 154/75 today. Exam consistent with baseline left-sided weakness and left eye blindness. Not on anticoagulation. Consider aspirin  initiation. Plan: - Rosuvastatin  20 mg daily - Amlodipine  10 mg daily - Clonidine  0.2 mg BID - Start aspirin  81 mg daily  #Congestive heart failure No signs of volume overload on exam. Plan: - Continue triamterene -HCTZ 37.5/25 mg daily  #Generalized anxiety disorder / Schizoaffective disorder No acute psychosis; stable mood. On valproic  acid and PRN lorazepam  at home. Plan: - Continue divalproex  500 mg TID - Resume lorazepam  if needed  #Chronic low back pain with left-sided sciatica Stable symptoms. Plan: Cyclobenzaprine  5-10 mg TID PRN Gabapentin  300 mg TID  #Tobacco use disorder Smokes half a pack/day. Plan: Nicotine  patch  #GERD On omeprazole  40 mg daily at home. Plan: Pantoprazole  80 mg daily  #Left thyroid  nodule 2.1-cm nodule increased in size on CT; non-urgent outpatient ultrasound recommended. Plan: Arrange outpatient thyroid  ultrasound  Best Practice Diet: Carb-modified VTE prophylaxis: Enoxaparin  IVF: None Code status: Full  Signature:  Farah Lepak [facility]  10:16 AM, 08/02/2024  On Call pager [phone]

## 2024-08-02 NOTE — Care Management Obs Status (Signed)
 MEDICARE OBSERVATION STATUS NOTIFICATION   Patient Details  Name: Susan [name] MRN: [id number] Date of Birth: 1960/10/07   Medicare Observation Status Notification Given:  Yes    Samhita Kretsch G., RN 08/02/2024, 9:05 AM

## 2024-08-03 ENCOUNTER — Ambulatory Visit (HOSPITAL_COMMUNITY)

## 2024-08-03 ENCOUNTER — Telehealth (HOSPITAL_COMMUNITY): Payer: Self-pay

## 2024-08-03 ENCOUNTER — Other Ambulatory Visit (HOSPITAL_COMMUNITY): Payer: Self-pay

## 2024-08-03 DIAGNOSIS — J188 Other pneumonia, unspecified organism: Secondary | ICD-10-CM | POA: Diagnosis not present

## 2024-08-03 LAB — BASIC METABOLIC PANEL WITH GFR
Anion gap: 5 (ref 5–15)
BUN: 17 mg/dL (ref 8–23)
CO2: 34 mmol/L — ABNORMAL HIGH (ref 22–32)
Calcium: 8.5 mg/dL — ABNORMAL LOW (ref 8.9–10.3)
Chloride: 100 mmol/L (ref 98–111)
Creatinine, Ser: 1.03 mg/dL — ABNORMAL HIGH (ref 0.44–1.00)
GFR, Estimated: >60 mL/min (ref >60)
Glucose, Bld: 97 mg/dL (ref 70–99)
Potassium: 3.8 mmol/L (ref 3.5–5.1)
Sodium: 139 mmol/L (ref 135–145)

## 2024-08-03 LAB — CBC
HCT: 39.5 % (ref 36.0–46.0)
Hemoglobin: 13.3 g/dL (ref 12.0–15.0)
MCH: 32.4 pg (ref 26.0–34.0)
MCHC: 33.7 g/dL (ref 30.0–36.0)
MCV: 96.3 fL (ref 80.0–100.0)
Platelets: 206 K/uL (ref 150–400)
RBC: 4.1 MIL/uL (ref 3.87–5.11)
RDW: 14.6 % (ref 11.5–15.5)
WBC: 6.2 K/uL (ref 4.0–10.5)
nRBC: 0 % (ref 0.0–0.2)

## 2024-08-03 LAB — GLUCOSE, CAPILLARY
Glucose-Capillary: 120 mg/dL — ABNORMAL HIGH (ref 70–99)
Glucose-Capillary: 92 mg/dL (ref 70–99)
Glucose-Capillary: 101 mg/dL — ABNORMAL HIGH (ref 70–99)
Glucose-Capillary: 110 mg/dL — ABNORMAL HIGH (ref 70–99)

## 2024-08-03 MED ORDER — GUAIFENESIN ER 600 MG PO TB12
600.0000 mg | ORAL_TABLET | Freq: Two times a day (BID) | ORAL | 0 refills | Status: AC
  Filled 2024-08-03: qty 10, 5d supply, fill #0

## 2024-08-03 MED ORDER — AMOXICILLIN-POT CLAVULANATE 875-125 MG PO TABS
1.0000 | ORAL_TABLET | Freq: Two times a day (BID) | ORAL | 0 refills | Status: DC
  Filled 2024-08-03: qty 4, 2d supply, fill #0

## 2024-08-03 NOTE — Telephone Encounter (Signed)
 Please advise [facility]

## 2024-08-03 NOTE — Discharge Summary (Signed)
 Name: Susan [name] MRN: [id number] DOB: 21-Feb-1961 63 y.o. PCP: Oley Bascom RAMAN, NP  Date of Admission: 08/01/2024  9:16 PM Date of Discharge: 08/03/2024 Attending Physician: Dr. Mliss Pouch  Discharge Diagnosis: 1. Active Problems:   Multifocal pneumonia   Discharge Medications: Allergies as of 08/03/2024       Reactions   Lyrica  [pregabalin ] Swelling   Has LE swelling        Medication List     PAUSE taking these medications    lactulose  10 GM/15ML solution Wait to take this until your doctor or other care provider tells you to start again. Commonly known as: CHRONULAC  Take 20 g by mouth daily as needed for mild constipation, moderate constipation or severe constipation.   Lantus  SoloStar 100 UNIT/ML Solostar Pen Wait to take this until your doctor or other care provider tells you to start again. Generic drug: insulin  glargine Inject 15 Units into the skin daily.       TAKE these medications    Accu-Chek Guide Test test strip Generic drug: glucose blood Use as instructed   Accu-Chek Softclix Lancets lancets Use as instructed   acetaminophen  500 MG tablet Commonly known as: TYLENOL  Take 1,000 mg by mouth every 6 (six) hours as needed for mild pain (pain score 1-3), moderate pain (pain score 4-6) or fever.   albuterol  (2.5 MG/3ML) 0.083% nebulizer solution Commonly known as: PROVENTIL  Take 3 mLs (2.5 mg total) by nebulization every 6 (six) hours as needed for wheezing or shortness of breath.   amLODipine  10 MG tablet Commonly known as: NORVASC  Take 1 tablet (10 mg total) by mouth daily.   amoxicillin -clavulanate 875-125 MG tablet Commonly known as: AUGMENTIN  Take 1 tablet by mouth 2 (two) times daily.   cloNIDine  0.2 MG tablet Commonly known as: CATAPRES  TAKE 1 TABLET(0.2 MG) BY MOUTH TWICE DAILY   cyclobenzaprine  10 MG tablet Commonly known as: FLEXERIL  Take 10 mg by mouth 3 (three) times daily as needed for muscle spasms.   divalproex   500 MG DR tablet Commonly known as: DEPAKOTE  TAKE 1 TABLET(500 MG) BY MOUTH THREE TIMES DAILY   gabapentin  300 MG capsule Commonly known as: NEURONTIN  Take 1 capsule (300 mg total) by mouth 3 (three) times daily.   guaiFENesin  600 MG 12 hr tablet Commonly known as: MUCINEX  Take 1 tablet (600 mg total) by mouth 2 (two) times daily.   Insulin  Pen Needle 31G X 8 MM Misc 1 each by Does not apply route as needed. Use as directed   Lacosamide  150 MG Tabs Take 1 tablet (150 mg total) by mouth 2 (two) times daily.   levETIRAcetam  1000 MG tablet Commonly known as: KEPPRA  Take 1 tablet (1,000 mg total) by mouth 2 (two) times daily.   LORazepam  0.5 MG tablet Commonly known as: ATIVAN  TAKE 1 TABLET(0.5 MG) BY MOUTH EVERY 6 HOURS AS NEEDED FOR ANXIETY What changed: See the new instructions.   metFORMIN  500 MG tablet Commonly known as: GLUCOPHAGE  Take 1 tablet (500 mg total) by mouth 2 (two) times daily.   omeprazole  40 MG capsule Commonly known as: PRILOSEC TAKE 1 CAPSULE(40 MG) BY MOUTH DAILY   PHENobarbital  64.8 MG tablet Commonly known as: LUMINAL Take 1 tablet (64.8 mg total) by mouth 2 (two) times daily. TAKE 1 TABLET(64.8 MG) BY MOUTH TWICE DAILY What changed: additional instructions   risperiDONE  2 MG tablet Commonly known as: RISPERDAL  Take 1 tablet (2 mg total) by mouth at bedtime. What changed:  when to take this  additional instructions   rosuvastatin  20 MG tablet Commonly known as: CRESTOR  TAKE 1 TABLET(20 MG) BY MOUTH DAILY   sertraline  100 MG tablet Commonly known as: ZOLOFT  Take 1 tablet (100 mg total) by mouth at bedtime.   Tivicay  50 MG tablet Generic drug: dolutegravir  Take 1 tablet (50 mg total) by mouth daily. What changed: when to take this   triamterene -hydrochlorothiazide  37.5-25 MG tablet Commonly known as: MAXZIDE -25 Take 1 tablet by mouth daily.   Triumeq  600-50-300 MG tablet Generic drug: abacavir -dolutegravir -lamiVUDine  TAKE 1 TABLET BY  MOUTH EVERY MORNING               Durable Medical Equipment  (From admission, onward)           Start     Ordered   08/03/24 0739  For home use only DME Nebulizer machine  Once       Question Answer Comment  Patient needs a nebulizer to treat with the following condition COPD (chronic obstructive pulmonary disease) (HCC)   Length of Need Lifetime   Additional equipment included Administration kit   Additional equipment included Filter      08/03/24 0739            Disposition and follow-up:   Ms.Ramiya A Roanhorse was discharged from [facility] in Stable condition.  At the hospital follow up visit please address: PCP Follow-Up Recommendations  1. Pneumonia - Patient completed 3 days of ceftriaxone  + azithromycin  inpatient. She was discharged on Augmentin  875 mg BID for 2 additional days to complete therapy. - Assess for resolution of cough, fever, dyspnea, and overall functional status. She is currently using Symbicort and nebulizer treatments; evaluate ongoing need for inhaled therapy. - Recommend pulmonary function testing (PFTs) to assess for underlying obstructive lung disease, given chronic smoking history and wheezing on exam.  2. Seizure Disorder - Patient had a breakthrough seizure during hospitalization. She is currently on four antiepileptic medications (levetiracetam , lacosamide , phenobarbital , divalproex ). - Consider medication simplification or adjustment, particularly reassessing the role of phenobarbital . - Recommend checking a phenobarbital  level and coordinating care with Neurology for long-term optimization of her regimen.  3. Additional Items - Review pending HIV viral load (drawn during hospitalization). - Ensure outpatient ultrasound is ordered for left thyroid  nodule (2.1 cm). - Reinforce smoking cessation support.  2.  Labs / imaging needed at time of follow-up: CBC,CMP,PFT  3.  Pending labs/ test needing follow-up:  None  Follow-up Appointments:  Follow-up Information     Oley Bascom RAMAN, NP. Call today.   Specialties: Pulmonary Disease, Endocrinology Why: 1 week hospital F/U Contact information: [address] [place] [address] [phone]         [facility] Follow up.   Why: Someone from [facility] will contact you to arrange date and time to resume Home Health Physical and occupational therapy. Contact information: [address] [place] [address] [phone]                  Hospital Course by problem list:  Jaimarie Rapozo. Hocevar is a 63 year old woman with HIV (undetectable), epilepsy, prior CVA with residual deficits, HFrecEF, HTN, T2DM, schizoaffective disorder, tobacco use, and GERD who presented with 2 days of fever, productive cough, and weakness. She was admitted for multifocal pneumonia.  #Multifocal Pneumonia  CT chest demonstrated mild patchy ground-glass opacities in the right upper lobe and left lower lobe concerning for infection. She reported fever  up to 101.50F, productive cough, and increased weakness. WBC and lactate remained normal, and she remained stable on room air throughout admission. She was treated with ceftriaxone  and azithromycin , bronchodilator therapy (Breo, DuoNebs), guaifenesin , incentive spirometry, and airway clearance measures (flutter valve, chest physiotherapy). Her cough and energy improved, and she had no further fevers. Because symptoms improved but she still had mild cough, she was discharged with Augmentin  875 mg BID for 2 additional days to complete therapy.  #Breakthrough Seizure / Epilepsy  Her daughter witnessed a typical seizure (staring, jaw twitching) on the day of admission. These episodes are often triggered by illness. No further seizures occurred during hospitalization. She continued her home antiepileptic regimen (levetiracetam , phenobarbital , and lacosamide ). Levels were  ordered. Neurology follow-up was recommended for medication reassessment, particularly phenobarbital , given breakthrough event.  #HIV Infection  She continued her home antiretroviral regimen (abacavir -dolutegravir -lamivudine  and dolutegravir ). She remained clinically stable with no HIV-related complications. Repeat viral load was sent and is pending at discharge.  #Hypertension / Hyperlipidemia / Prior CVA  Blood pressure remained controlled on her home regimen (amlodipine  and clonidine ). She continued rosuvastatin . Given her stroke history and no contraindications, aspirin  81 mg was initiated. Her chronic left-sided weakness and left eye blindness remained at baseline.  #Type 2 Diabetes Mellitus  CBG on arrival was mildly low due to decreased oral intake while ill. She did not require insulin  during hospitalization. Once oral intake improved, she resumed her home diabetes regimen. Glucose values remained stable.  #Congestive Heart Failure (HFrecEF)  She was clinically euvolemic with stable examination and creatinine. She continued her home triamterene -HCTZ. No adjustments were required.  #Schizoaffective Disorder / Anxiety  She remained psychiatrically stable with no hallucinations or behavioral concerns. She continued divalproex , and lorazepam  PRN was available if needed.  #Chronic Low Back Pain / Sciatica  Pain remained controlled on home medications (gabapentin  and cyclobenzaprine ). No acute changes occurred.  #Tobacco Use Disorder  She was counseled on cessation and received a nicotine  patch during admission. Smoking cessation was strongly encouraged.  #GERD  She continued her proton pump inhibitor (pantoprazole  while inpatient; resume omeprazole  at home).  #Left Thyroid  Nodule  CT chest incidentally showed a 2.1-cm left thyroid  nodule, increased in size from prior imaging. No acute intervention required. Outpatient thyroid  ultrasound was recommended.    Subjective Constitutional: Pleasant woman resting in bed, no acute distress. HENT: Normocephalic, atraumatic; mucous membranes dry. Eyes: Non-erythematous conjunctiva; right eye closed. Neck: Supple. Cardiovascular: Regular rate and rhythm; no murmurs, rubs, or gallops; no peripheral edema. Pulmonary: Normal work of breathing on room air; mild wheezing in bilateral upper lobes. Abdomen: Soft, non-tender, non-distended. MSK: Normal muscle bulk and tone. Neurological: Alert; strength intact in all extremities, with baseline left-sided weakness (right > left, most prominent in upper extremities). Skin: Warm, dry, with normal turgor. Psych: Calm, cooperative; no evidence of hallucinations or internal preoccupation   Discharge Exam:   BP (!) 157/69 (BP Location: Left Wrist)   Pulse 66   Temp 98.2 F (36.8 C) (Oral)   Resp 18   Ht 5' 2 (1.575 m)   Wt 118.8 kg   LMP  (LMP Unknown) Comment: neg hcg 07/20/20  SpO2 97%   BMI 47.92 kg/m  Discharge exam:   Pertinent Labs, Studies, and Procedures:     Latest Ref Rng & Units 08/03/2024    4:42 AM 08/02/2024    6:12 AM 08/01/2024    9:50 PM  CBC  WBC 4.0 - 10.5 K/uL 6.2  6.6  6.8  Hemoglobin 12.0 - 15.0 g/dL 86.6  86.7  86.8   Hematocrit 36.0 - 46.0 % 39.5  38.5  40.0   Platelets 150 - 400 K/uL 206  179  198        Latest Ref Rng & Units 08/03/2024    4:42 AM 08/02/2024    4:41 AM 08/01/2024    9:50 PM  CMP  Glucose 70 - 99 mg/dL 97  895  79   BUN 8 - 23 mg/dL 17  14  15    Creatinine 0.44 - 1.00 mg/dL 8.96  9.12  8.94   Sodium 135 - 145 mmol/L 139  136  139   Potassium 3.5 - 5.1 mmol/L 3.8  3.4  3.9   Chloride 98 - 111 mmol/L 100  97  99   CO2 22 - 32 mmol/L 34  27  29   Calcium  8.9 - 10.3 mg/dL 8.5  8.1  8.3   Total Protein 6.5 - 8.1 g/dL   6.1   Total Bilirubin 0.0 - 1.2 mg/dL   0.2   Alkaline Phos 38 - 126 U/L   92   AST 15 - 41 U/L   15   ALT 0 - 44 U/L   15     CT Chest Wo Contrast Result Date:  08/01/2024 CLINICAL DATA:  Pneumonia EXAM: CT CHEST WITHOUT CONTRAST TECHNIQUE: Multidetector CT imaging of the chest was performed following the standard protocol without IV contrast. RADIATION DOSE REDUCTION: This exam was performed according to the departmental dose-optimization program which includes automated exposure control, adjustment of the mA and/or kV according to patient size and/or use of iterative reconstruction technique. COMPARISON:  Chest x-ray same day. CT chest abdomen and pelvis 07/20/2020. FINDINGS: Cardiovascular: Heart is mildly enlarged. Aorta is normal in size. There is no pericardial effusion. There are atherosclerotic calcifications of the aorta and coronary arteries. Aberrant right subclavian artery again noted. Mediastinum/Nodes: Left thyroid  nodule measures up to 2.1 cm and has increased in size. There are no enlarged mediastinal or hilar lymph nodes allowing for lack of intravenous contrast. Visualized esophagus is within normal limits. Lungs/Pleura: There is central peribronchial wall thickening bilaterally. There is no focal lung infiltrate. There are mild patchy ground-glass opacities in the right upper lobe. There is an tree-in-bud and ill-defined ground-glass opacities in the central left lower lobe. There is no pleural effusion or pneumothorax. Upper Abdomen: Gallstones are present. Musculoskeletal: There is DISH of the thoracic spine. IMPRESSION: 1. Mild patchy ground-glass opacities in the right upper lobe and left lower lobe worrisome for infection. 2. Central peribronchial wall thickening compatible with bronchitis. 3. Cholelithiasis. 4. Left thyroid  nodule has increased in size now measuring 2.1 cm Recommend thyroid  US  (ref: J Am Coll Radiol. 2015 Feb;12(2): 143-50). (Ref: J Am Coll Radiol. 2015 Feb;12(2): 143-50). 5. Aortic atherosclerosis. Aortic Atherosclerosis (ICD10-I70.0). Electronically Signed   By: Greig Pique M.D.   On: 08/01/2024 22:54   DG Chest Portable 1  View Result Date: 08/01/2024 EXAM: 1 VIEW(S) XRAY OF THE CHEST 08/01/2024 10:23:50 PM COMPARISON: 02/18/2024 CLINICAL HISTORY: SOB / Cough FINDINGS: LUNGS AND PLEURA: Low lung volumes. No focal pulmonary opacity. No pleural effusion. No pneumothorax. HEART AND MEDIASTINUM: Calcified aorta. No acute abnormality of the cardiac and mediastinal silhouettes. BONES AND SOFT TISSUES: Thoracic degenerative changes. No acute osseous abnormality. IMPRESSION: 1. No acute cardiopulmonary findings. Electronically signed by: Norman Gatlin MD 08/01/2024 10:30 PM EST RP Workstation: [id number]     Discharge Instructions: Discharge Instructions  Call MD for:  difficulty breathing, headache or visual disturbances   Complete by: As directed    Call MD for:  extreme fatigue   Complete by: As directed    Call MD for:  persistant dizziness or light-headedness   Complete by: As directed    Call MD for:  persistant nausea and vomiting   Complete by: As directed    Call MD for:  redness, tenderness, or signs of infection (pain, swelling, redness, odor or green/yellow discharge around incision site)   Complete by: As directed    Diet - low sodium heart healthy   Complete by: As directed    Discharge instructions   Complete by: As directed    Diagnosis: Multifocal pneumonia; breakthrough seizure; chronic conditions including HIV, epilepsy, prior stroke, CHF, HTN, T2DM, and schizoaffective disorder.  Medications Start: Augmentin  875 mg / 125 mg, Take 1 tablet by mouth twice a day for 2 more days to complete the antibiotic course for pneumonia. Albuterol -ipratropium nebulizer (DuoNeb). Use every 6 hours as needed for cough or shortness of breath. Use with your home nebulizer machine. If breathing worsens or you cannot catch your breath, seek medical care.   Seizure medications: levetiracetam  (Keppra ), phenobarbital , lacosamide  (Vimpat ) HIV medications: abacavir -dolutegravir -lamivudine , dolutegravir  Blood  pressure medications  Diabetes medications: We held your Insulin  due to well controlled blood sugar, discuss resuming it after primary care appointment.  Mood medications (divalproex , lorazepam  PRN) Pain medications including gabapentin  and cyclobenzaprine  Nicotine  patch if continuing smoking cessation  Breathing / Airway Care Use incentive spirometer 10 times per hour while awake. Continue flutter valve or airway clearance device at home if available. Use guaifenesin  (Mucinex ) to help thin mucus if previously recommended. Stay well hydrated to help loosen secretions.  Activity Resume activity as tolerated. Continue using your wheelchair and assistive devices for safe mobility. Avoid strenuous activity until cough and fatigue improve.  Warning Signs - Seek Medical Care If: Fever above 100.20F that does not improve Worsening shortness of breath Increased confusion New chest pain Inability to take medications or keep fluids down Any seizure lasting longer than 5 minutes, or repeated seizures without recovery   Follow-Up Appointments Primary Care Provider - Follow up in 1 week to review pneumonia recovery, medications, and any pending labs. Neurology - Follow up within 2-4 weeks for adjustment of seizure medications, especially phenobarbital  level review and assessment of breakthrough seizure.   Diet Carb-modified diet as previously recommended. Maintain good hydration while recovering from pneumonia.  Smoking Cessation A nicotine  patch was used in the hospital. Continue nicotine  replacement as instructed or discuss cessation plan with your PCP. Smoking increases risk of recurrent pneumonia and worsens lung disease.   Increase activity slowly   Complete by: As directed        Signed: Bernadine Manos, MD 08/03/2024, 11:52 AM

## 2024-08-03 NOTE — Evaluation (Signed)
 Occupational Therapy Evaluation Patient Details Name: Susan [name] MRN: [id number] DOB: October 24, 1960 Today's Date: 08/03/2024   History of Present Illness   Patient is a 63 yo female presenting to the ED with seizure-like activity and a fever on 08/01/24. Admitted with community acquired pneumonia. PMH signficant for Schizophrenia, Seizures, HIV, DM-2, CVA in 2021 with chronic cognitive/language deficits.     Clinical Impressions Prior to this admission, patient living with family, and has assist for all ADL management from family or an aide. Patient ambulates with a RW and is working with outpatient OT and PT. Patient is oriented to self at baseline, with PT calling for home information. Currently, patient appears to be at her cognitive baseline, and is min A for ADLs and transfers. OT will continue to follow acutely, with recommendation of continuance of outpatient OT at discharge.      If plan is discharge home, recommend the following:   A little help with walking and/or transfers;A lot of help with bathing/dressing/bathroom;Assistance with cooking/housework;Direct supervision/assist for medications management;Direct supervision/assist for financial management;Assist for transportation;Help with stairs or ramp for entrance;Supervision due to cognitive status     Functional Status Assessment   Patient has had a recent decline in their functional status and demonstrates the ability to make significant improvements in function in a reasonable and predictable amount of time.     Equipment Recommendations   None recommended by OT     Recommendations for Other Services         Precautions/Restrictions   Precautions Precautions: Fall Recall of Precautions/Restrictions: Impaired Restrictions Weight Bearing Restrictions Per Provider Order: No     Mobility Bed Mobility Overal bed mobility: Needs Assistance Bed Mobility: Supine to Sit     Supine to sit: Supervision           Transfers Overall transfer level: Needs assistance Equipment used: Rolling walker (2 wheels) Transfers: Sit to/from Stand Sit to Stand: Supervision                  Balance Overall balance assessment: Needs assistance Sitting-balance support: No upper extremity supported, Feet supported Sitting balance-Leahy Scale: Good     Standing balance support: Single extremity supported, Reliant on assistive device for balance Standing balance-Leahy Scale: Poor                             ADL either performed or assessed with clinical judgement   ADL Overall ADL's : Needs assistance/impaired Eating/Feeding: Set up;Sitting   Grooming: Set up;Sitting   Upper Body Bathing: Contact guard assist;Sitting   Lower Body Bathing: Moderate assistance;Sitting/lateral leans;Sit to/from stand   Upper Body Dressing : Contact guard assist;Sitting   Lower Body Dressing: Moderate assistance;Sit to/from stand;Sitting/lateral leans   Toilet Transfer: Minimal assistance;Ambulation;Rolling walker (2 wheels)   Toileting- Clothing Manipulation and Hygiene: Minimal assistance;Sitting/lateral lean;Sit to/from stand Toileting - Clothing Manipulation Details (indicate cue type and reason): for thoroughness     Functional mobility during ADLs: Minimal assistance;Rolling walker (2 wheels) General ADL Comments: Prior to this admission, patient living with family, and has assist for all ADL management from family or an aide. Patient ambulates with a RW and is working with outpatient OT and PT. Patient is oriented to self at baseline, with PT calling for home information. Currently, patient appears to be at her cognitive baseline, and is min A for ADLs and supervision for transfers. OT will continue to follow acutely, with recommendation of continuance  of outpatient OT at discharge.     Vision Baseline Vision/History: 0 No visual deficits Ability to See in Adequate Light: 0  Adequate Patient Visual Report: No change from baseline Vision Assessment?: No apparent visual deficits     Perception Perception: Not tested       Praxis Praxis: Not tested       Pertinent Vitals/Pain       Extremity/Trunk Assessment Upper Extremity Assessment Upper Extremity Assessment: Generalized weakness;RUE deficits/detail;LUE deficits/detail RUE Deficits / Details: decreased grasp in RUE and R shoulder flexion, is able to complete tasks with extra time RUE Coordination: decreased fine motor;decreased gross motor LUE Deficits / Details: decreased grasp, supports with R hand, is functional with extra time LUE Coordination: decreased fine motor;decreased gross motor   Lower Extremity Assessment Lower Extremity Assessment: Defer to PT evaluation   Cervical / Trunk Assessment Cervical / Trunk Assessment: Normal   Communication Communication Communication: Impaired Factors Affecting Communication: Difficulty expressing self   Cognition Arousal: Alert Behavior During Therapy: WFL for tasks assessed/performed Cognition: History of cognitive impairments             OT - Cognition Comments: Per chart review, patient is oriented to self only, alert and happy upon arrival, appears to be at her baseline                 Following commands: Impaired Following commands impaired: Follows one step commands with increased time, Follows multi-step commands inconsistently     Cueing  General Comments   Cueing Techniques: Verbal cues;Tactile cues;Visual cues  VSS on RA   Exercises     Shoulder Instructions      Home Living Family/patient expects to be discharged to:: Private residence Living Arrangements: Children Available Help at Discharge: Family;Available 24 hours/day Type of Home: House Home Access: Stairs to enter [employer] of Steps: 8 Entrance Stairs-Rails: Right;Left Home Layout: One level     Bathroom Shower/Tub: Company Secretary: Standard     Home Equipment: Agricultural Consultant (2 wheels);Wheelchair Financial Trader (4 wheels);Adaptive equipment   Additional Comments: history obtained over phone call with [name] (pt's daughter and primary caregiver)      Prior Functioning/Environment Prior Level of Function : Needs assist             Mobility Comments: ambulatory with RW, working with outpatient PT and OT ADLs Comments: assistance for all ADLS, family or nursing aide assist with bathing    OT Problem List: Decreased activity tolerance;Decreased coordination;Decreased strength;Decreased safety awareness   OT Treatment/Interventions: Self-care/ADL training;Therapeutic exercise;Energy conservation;DME and/or AE instruction;Manual therapy;Therapeutic activities;Patient/family education;Balance training      OT Goals(Current goals can be found in the care plan section)   Acute Rehab OT Goals Patient Stated Goal: to get some coffee OT Goal Formulation: Patient unable to participate in goal setting Time For Goal Achievement: 08/17/24 Potential to Achieve Goals: Good ADL Goals Pt Will Perform Lower Body Bathing: with modified independence;sitting/lateral leans;sit to/from stand Pt Will Perform Lower Body Dressing: with modified independence;sit to/from stand;sitting/lateral leans Pt Will Transfer to Toilet: with modified independence;regular height toilet;ambulating Pt Will Perform Toileting - Clothing Manipulation and hygiene: with modified independence;sitting/lateral leans;sit to/from stand   OT Frequency:  Min 2X/week    Co-evaluation   Reason for Co-Treatment: To address functional/ADL transfers PT goals addressed during session: Mobility/safety with mobility;Balance;Proper use of DME OT goals addressed during session: ADL's and self-care      AM-PAC OT 6 Clicks Daily Activity  Outcome Measure Help from another person eating meals?: A Little Help from another person taking  care of personal grooming?: A Little Help from another person toileting, which includes using toliet, bedpan, or urinal?: A Little Help from another person bathing (including washing, rinsing, drying)?: A Little Help from another person to put on and taking off regular upper body clothing?: A Little Help from another person to put on and taking off regular lower body clothing?: A Little 6 Click Score: 18   End of Session Equipment Utilized During Treatment: Rolling walker (2 wheels);Gait belt Nurse Communication: Mobility status  Activity Tolerance: Patient tolerated treatment well Patient left: in chair;with call bell/phone within reach;with chair alarm set  OT Visit Diagnosis: Unsteadiness on feet (R26.81);Muscle weakness (generalized) (M62.81);Other abnormalities of gait and mobility (R26.89);Other symptoms and signs involving cognitive function                Time: 9180-9164 OT Time Calculation (min): 16 min Charges:  OT General Charges $OT Visit: 1 Visit OT Evaluation $OT Eval Moderate Complexity: 1 Mod  Ronal Gift E. Kairi Tufo, OTR/L Acute Rehabilitation Services [phone]   Ronal Gift Salt 08/03/2024, 10:26 AM

## 2024-08-03 NOTE — Plan of Care (Signed)
   Problem: Education: Goal: Knowledge of General Education information will improve Description: Including pain rating scale, medication(s)/side effects and non-pharmacologic comfort measures Outcome: Progressing   Problem: Health Behavior/Discharge Planning: Goal: Ability to manage health-related needs will improve Outcome: Progressing   Problem: Clinical Measurements: Goal: Ability to maintain clinical measurements within normal limits will improve Outcome: Progressing Goal: Will remain free from infection Outcome: Progressing Goal: Diagnostic test results will improve Outcome: Progressing Goal: Respiratory complications will improve Outcome: Progressing Goal: Cardiovascular complication will be avoided Outcome: Progressing   Problem: Activity: Goal: Risk for activity intolerance will decrease Outcome: Progressing   Problem: Nutrition: Goal: Adequate nutrition will be maintained Outcome: Progressing   Problem: Pain Managment: Goal: General experience of comfort will improve and/or be controlled Outcome: Progressing   Problem: Safety: Goal: Ability to remain free from injury will improve Outcome: Progressing   Problem: Skin Integrity: Goal: Risk for impaired skin integrity will decrease Outcome: Progressing

## 2024-08-03 NOTE — Telephone Encounter (Signed)
 Pharmacy Patient Advocate Encounter  Insurance verification completed.    The patient is insured through [facility]. Patient has Medicare and is not eligible for a copay card, but may be able to apply for patient assistance or Medicare RX Payment Plan (Patient Must reach out to their plan, if eligible for payment plan), if available.    Ran test claim for Breo Ellipta 100-25mcg and the current 30 day co-pay is $0.   This test claim was processed through [employer]- copay amounts may vary at other pharmacies due to [employer], or as the patient moves through the different stages of their insurance plan.

## 2024-08-03 NOTE — TOC Progression Note (Addendum)
 Transition of Care [facility]) - Progression Note    Patient Details  Name: Susan [name] MRN: [id number] Date of Birth: 12-01-1960  Transition of [facility]) CM/SW Contact  Nola Devere Hands, RN Phone Number: 08/03/2024, 11:36 AM  Clinical Narrative:    Case Manager spoke with patient's daughter concerning name of Home Health agency patient is using. Confirmed that it is [facility]. CM messaged Adriana, Liaison with [phone] to confirm. CM requested resumption of care orders for HHPT/OT from MD. No further needs identified.    12/1 12:20p: Adriana with [facility] responded, states patient is no longer Active with them but they will accept as new referral.       Expected Discharge Plan: Home w Home Health Services Barriers to Discharge: No Barriers Identified               Expected Discharge Plan and Services In-house Referral: NA Discharge Planning Services: CM Consult Post Acute Care Choice: Home Health, Resumption of Svcs/PTA Provider Living arrangements for the past 2 months: Single Family Home                 DME Arranged: N/A         HH Arranged: PT, OT HH Agency: [facility] Date [facility] Agency Contacted: 08/03/24 Time HH Agency Contacted: 1136 Representative spoke with at [facility] Agency: Adriana   Social Drivers of Health (SDOH) Interventions SDOH Screenings   Food Insecurity: No Food Insecurity (08/02/2024)  Housing: Low Risk  (08/02/2024)  Transportation Needs: No Transportation Needs (08/02/2024)  Utilities: Not At Risk (08/02/2024)  Alcohol  Screen: Low Risk  (07/11/2017)  Depression (PHQ2-9): Low Risk  (01/25/2023)  Social Connections: Patient Unable To Answer (08/02/2024)  Tobacco Use: High Risk (08/02/2024)    Readmission Risk Interventions     No data to display

## 2024-08-03 NOTE — TOC Progression Note (Signed)
 Transition of Care [facility]) - Progression Note    Patient Details  Name: SUNG RENTON MRN: [id number] Date of Birth: 1961/02/23  Transition of Care [facility]) CM/SW Contact  Nola Devere Hands, RN Phone Number: 08/03/2024, 12:04 PM  Clinical Narrative:    Patient will discharge home this evening. Medical Necessity form has been completed,  Family wont be home until after 5pm,PTAR has been requested for 6pm transport. Bedside RN and SW aware.   Expected Discharge Plan: Home w Home Health Services Barriers to Discharge: No Barriers Identified               Expected Discharge Plan and Services In-house Referral: NA Discharge Planning Services: CM Consult Post Acute Care Choice: Home Health, Resumption of Svcs/PTA Provider Living arrangements for the past 2 months: Single Family Home Expected Discharge Date: 08/03/24               DME Arranged: N/A         HH Arranged: PT, OT HH Agency: [facility] Date HH Agency Contacted: 08/03/24 Time HH Agency Contacted: 1136 Representative spoke with at [facility] Agency: Adriana   Social Drivers of Health (SDOH) Interventions SDOH Screenings   Food Insecurity: No Food Insecurity (08/02/2024)  Housing: Low Risk  (08/02/2024)  Transportation Needs: No Transportation Needs (08/02/2024)  Utilities: Not At Risk (08/02/2024)  Alcohol  Screen: Low Risk  (07/11/2017)  Depression (PHQ2-9): Low Risk  (01/25/2023)  Social Connections: Patient Unable To Answer (08/02/2024)  Tobacco Use: High Risk (08/02/2024)    Readmission Risk Interventions     No data to display

## 2024-08-03 NOTE — Discharge Instructions (Addendum)
 Diagnosis: Multifocal pneumonia; breakthrough seizure; chronic conditions including HIV, epilepsy, prior stroke, CHF, HTN, T2DM, and schizoaffective disorder.  Medications Start: Augmentin  875 mg / 125 mg, Take 1 tablet by mouth twice a day for 2 more days to complete the antibiotic course for pneumonia. Albuterol -ipratropium nebulizer (DuoNeb). Use every 6 hours as needed for cough or shortness of breath. Use with your home nebulizer machine. If breathing worsens or you cannot catch your breath, seek medical care.   Seizure medications: levetiracetam  (Keppra ), phenobarbital , lacosamide  (Vimpat ) HIV medications: abacavir -dolutegravir -lamivudine , dolutegravir  Blood pressure medications  Diabetes medications: We held your Insulin  due to well controlled blood sugar, discuss resuming it after primary care appointment.  Mood medications (divalproex , lorazepam  PRN) Pain medications including gabapentin  and cyclobenzaprine  Nicotine  patch if continuing smoking cessation  Breathing / Airway Care Use incentive spirometer 10 times per hour while awake. Continue flutter valve or airway clearance device at home if available. Use guaifenesin  (Mucinex ) to help thin mucus if previously recommended. Stay well hydrated to help loosen secretions.  Activity Resume activity as tolerated. Continue using your wheelchair and assistive devices for safe mobility. Avoid strenuous activity until cough and fatigue improve.  Warning Signs -- Seek Medical Care If: Fever above 100.100F that does not improve Worsening shortness of breath Increased confusion New chest pain Inability to take medications or keep fluids down Any seizure lasting longer than 5 minutes, or repeated seizures without recovery   Follow-Up Appointments Primary Care Provider - Follow up in 1 week to review pneumonia recovery, medications, and any pending labs. Neurology - Follow up within 2-4 weeks for adjustment of seizure  medications, especially phenobarbital  level review and assessment of breakthrough seizure.   Diet Carb-modified diet as previously recommended. Maintain good hydration while recovering from pneumonia.  Smoking Cessation A nicotine  patch was used in the hospital. Continue nicotine  replacement as instructed or discuss cessation plan with your PCP. Smoking increases risk of recurrent pneumonia and worsens lung disease.

## 2024-08-03 NOTE — Telephone Encounter (Signed)
 Patient had an appointment for psychiatric assessment today at 1:30 PM.  Called the patient, her daughter answered the phone stating that she is the POA of the patient and her mother is currently hospitalized with a likely discharge later today.  She wanted to have the appointment rescheduled.  Due to transportation issues she would like appointment to be virtual.  Susan [name]

## 2024-08-03 NOTE — Evaluation (Signed)
 Physical Therapy Evaluation Patient Details Name: Susan [name] MRN: [id number] DOB: Aug 12, 1961 Today's Date: 08/03/2024  History of Present Illness  Patient is a 63 yo female presenting to the ED with seizure-like activity and a fever on 08/01/24. Admitted with community acquired pneumonia. PMH signficant for Schizophrenia, Seizures, HIV, DM-2, CVA in 2021 with chronic cognitive/language deficits.  Clinical Impression  Pt presents to PT likely at or near her baseline based on reports of [name] via phone call. Susan [name] is currently able to ambulate for household distances with support of a RW, demonstrating fair stability at this time. Prior to admission Susan [name] was working with outpatient PT and OT, PT recommends continued outpatient therapies at the time of discharge.        If plan is discharge home, recommend the following: A lot of help with bathing/dressing/bathroom;Assistance with cooking/housework;Assist for transportation;Help with stairs or ramp for entrance   Can travel by private vehicle        Equipment Recommendations None recommended by PT  Recommendations for Other Services       Functional Status Assessment Patient has had a recent decline in their functional status and demonstrates the ability to make significant improvements in function in a reasonable and predictable amount of time.     Precautions / Restrictions Precautions Precautions: Fall Recall of Precautions/Restrictions: Impaired Restrictions Weight Bearing Restrictions Per Provider Order: No      Mobility  Bed Mobility Overal bed mobility: Needs Assistance Bed Mobility: Supine to Sit     Supine to sit: Supervision          Transfers Overall transfer level: Needs assistance Equipment used: Rolling walker (2 wheels) Transfers: Sit to/from Stand Sit to Stand: Supervision                Ambulation/Gait Ambulation/Gait assistance: Supervision Gait Distance (Feet): 100  Feet Assistive device: Rolling walker (2 wheels) Gait Pattern/deviations: Step-through pattern Gait velocity: reduced Gait velocity interpretation: <1.31 ft/sec, indicative of household ambulator   General Gait Details: slowed step-through gait, reduced stride length, widened BOS  Stairs            Wheelchair Mobility     Tilt Bed    Modified Rankin (Stroke Patients Only)       Balance Overall balance assessment: Needs assistance Sitting-balance support: No upper extremity supported, Feet supported Sitting balance-Leahy Scale: Good     Standing balance support: Single extremity supported, Reliant on assistive device for balance Standing balance-Leahy Scale: Poor                               Pertinent Vitals/Pain Pain Assessment Pain Assessment: No/denies pain    Home Living Family/patient expects to be discharged to:: Private residence Living Arrangements: Children Available Help at Discharge: Family;Available 24 hours/day Type of Home: House Home Access: Stairs to enter Entrance Stairs-Rails: Doctor, [facility] of Steps: 8   Home Layout: One level Home Equipment: Agricultural Consultant (2 wheels);Wheelchair Financial Trader (4 wheels);Adaptive equipment Additional Comments: history obtained over phone call with Susan [name] (pt's daughter and primary caregiver)    Prior Function Prior Level of Function : Needs assist             Mobility Comments: ambulatory with RW, working with outpatient PT and OT ADLs Comments: assistance for all ADLS, family or nursing aide assist with bathing     Extremity/Trunk Assessment   Upper Extremity Assessment Upper Extremity Assessment: Defer  to OT evaluation    Lower Extremity Assessment Lower Extremity Assessment: Overall WFL for tasks assessed    Cervical / Trunk Assessment Cervical / Trunk Assessment: Normal  Communication   Communication Communication: Impaired Factors Affecting  Communication: Difficulty expressing self    Cognition Arousal: Alert Behavior During Therapy: WFL for tasks assessed/performed   PT - Cognitive impairments: History of cognitive impairments                       PT - Cognition Comments: pt is alert, oriented only to person (unable to provide DOB) Following commands: Impaired Following commands impaired: Follows one step commands with increased time, Follows multi-step commands inconsistently     Cueing Cueing Techniques: Verbal cues, Tactile cues, Visual cues     General Comments General comments (skin integrity, edema, etc.): VSS on RA    Exercises     Assessment/Plan    PT Assessment Patient needs continued PT services  PT Problem List Decreased activity tolerance;Decreased balance;Decreased mobility;Decreased knowledge of use of DME;Decreased safety awareness;Decreased knowledge of precautions;Decreased cognition       PT Treatment Interventions DME instruction;Gait training;Stair training;Functional mobility training;Therapeutic activities;Therapeutic exercise;Neuromuscular re-education;Balance training;Patient/family education    PT Goals (Current goals can be found in the Care Plan section)  Acute Rehab PT Goals Patient Stated Goal: to return home PT Goal Formulation: With patient Time For Goal Achievement: 08/17/24 Potential to Achieve Goals: Good    Frequency Min 1X/week     Co-evaluation PT/OT/SLP Co-Evaluation/Treatment: Yes Reason for Co-Treatment: To address functional/ADL transfers PT goals addressed during session: Mobility/safety with mobility;Balance;Proper use of DME         AM-PAC PT 6 Clicks Mobility  Outcome Measure Help needed turning from your back to your side while in a flat bed without using bedrails?: A Little Help needed moving from lying on your back to sitting on the side of a flat bed without using bedrails?: A Little Help needed moving to and from a bed to a chair  (including a wheelchair)?: A Little Help needed standing up from a chair using your arms (e.g., wheelchair or bedside chair)?: A Little Help needed to walk in hospital room?: A Little Help needed climbing 3-5 steps with a railing? : A Lot 6 Click Score: 17    End of Session Equipment Utilized During Treatment: Gait belt Activity Tolerance: Patient tolerated treatment well Patient left: in chair;with call bell/phone within reach;with chair alarm set Nurse Communication: Mobility status PT Visit Diagnosis: Other abnormalities of gait and mobility (R26.89)    Time: 9179-9164 PT Time Calculation (min) (ACUTE ONLY): 15 min   Charges:   PT Evaluation $PT Eval Low Complexity: 1 Low   PT General Charges $$ ACUTE PT VISIT: 1 Visit         Bernardino JINNY Ruth, PT, DPT Acute Rehabilitation Office [phone]   Bernardino JINNY Ruth 08/03/2024, 8:51 AM

## 2024-08-04 LAB — HIV-1/2 AB - DIFFERENTIATION
HIV 1 Ab: REACTIVE — AB
HIV 2 Ab: NONREACTIVE

## 2024-08-05 ENCOUNTER — Telehealth: Payer: Self-pay

## 2024-08-05 NOTE — Telephone Encounter (Signed)
 Copied from CRM [id number]. Topic: Clinical - Home Health Verbal Orders >> Aug 05, 2024  1:30 PM Tobias CROME wrote: Caller/Agency: [facility] [place] Number: [phone] Service Requested: Physical Therapy Frequency: Requesting new start of service date for tomorrow 08/06/24 Any new concerns about the patient? No

## 2024-08-06 LAB — CULTURE, BLOOD (ROUTINE X 2)
Culture: NO GROWTH
Culture: NO GROWTH

## 2024-08-06 LAB — LEGIONELLA PNEUMOPHILA SEROGP 1 UR AG: L. pneumophila Serogp 1 Ur Ag: NEGATIVE

## 2024-08-06 NOTE — Progress Notes (Addendum)
 This RN received a call 12/4 @ 1600 from [facility] that HIV1 AB result was reactive. RN notified Dr Mliss Pouch.

## 2024-08-06 NOTE — Telephone Encounter (Signed)
 Done [facility]

## 2024-08-07 ENCOUNTER — Ambulatory Visit: Payer: Self-pay | Admitting: Internal Medicine

## 2024-08-07 NOTE — Progress Notes (Signed)
 Test ordered incorrectly, was to have been viral load rather than antibody test in this woman with known HIV.

## 2024-08-10 ENCOUNTER — Ambulatory Visit: Payer: Self-pay | Admitting: Nurse Practitioner

## 2024-08-11 ENCOUNTER — Ambulatory Visit: Admitting: Internal Medicine

## 2024-08-13 ENCOUNTER — Ambulatory Visit: Admitting: Diagnostic Neuroimaging

## 2024-08-17 ENCOUNTER — Ambulatory Visit: Admitting: Nurse Practitioner

## 2024-08-19 ENCOUNTER — Other Ambulatory Visit: Payer: Self-pay | Admitting: Nurse Practitioner

## 2024-08-19 DIAGNOSIS — B20 Human immunodeficiency virus [HIV] disease: Secondary | ICD-10-CM

## 2024-08-21 ENCOUNTER — Telehealth: Payer: Self-pay | Admitting: Nurse Practitioner

## 2024-08-21 NOTE — Telephone Encounter (Signed)
 Copied from CRM [id number]. Topic: Clinical - Home Health Verbal Orders >> Aug 05, 2024  1:30 PM Tobias CROME wrote: Caller/Agency: [facility] [place] Number: [phone] Service Requested: Physical Therapy Frequency: Requesting new start of service date for tomorrow 08/06/24 Any new concerns about the patient? No  Joel Dixons D   08/21/2024  3:22 PM  Debbie from [facility] called to report that they attempted to see the patient at the beginning of December, but the patient declined services at that time. The patient had a fever yesterday and indicated  she would call back for services. The patient reached out yesterday and is now ready to begin home health services (OT and PT). [facility] would like to see the patient on 12/22, pending provider approval. Marval can be reached at [phone].

## 2024-08-21 NOTE — Telephone Encounter (Signed)
 Pt is requesting a refill on her medications. Please advise [facility]

## 2024-08-24 ENCOUNTER — Other Ambulatory Visit: Payer: Self-pay | Admitting: Nurse Practitioner

## 2024-08-24 ENCOUNTER — Telehealth: Payer: Self-pay

## 2024-08-24 DIAGNOSIS — I10 Essential (primary) hypertension: Secondary | ICD-10-CM

## 2024-08-24 NOTE — Telephone Encounter (Signed)
 JUST FYI. [facility]

## 2024-08-24 NOTE — Telephone Encounter (Signed)
 Copied from CRM [id number]. Topic: Clinical - Home Health Verbal Orders >> Aug 24, 2024  1:47 PM Nathanel BROCKS wrote: Caller/Agency: [facility] [place] Number: [phone] Service Requested: Physical Therapy Frequency: 1 w 1, 2 w 4, 1 w 3 Any new concerns about the patient? No

## 2024-08-24 NOTE — Telephone Encounter (Signed)
 Copied from CRM #[id number]. Topic: Clinical - Prescription Issue >> Aug 24, 2024  1:49 PM Nathanel BROCKS wrote: Reason for CRM:   PHENobarbital  (LUMINAL) 64.8 MG tablet, interaction between this medication and TRIUMEQ  600-50-300 MG tablet.  PHENobarbital  (LUMINAL) 64.8 MG tablet  TIVICAY  50 MG tablet interaction between this medication   PHENobarbital  (LUMINAL) 64.8 MG tablet  risperiDONE  (RISPERDAL ) 2 MG tablet interaction between this medication  Please call and advise. Pt [facility] at [facility] [phone]

## 2024-08-25 ENCOUNTER — Other Ambulatory Visit: Payer: Self-pay | Admitting: Nurse Practitioner

## 2024-08-25 DIAGNOSIS — F250 Schizoaffective disorder, bipolar type: Secondary | ICD-10-CM

## 2024-08-25 MED ORDER — PHENOBARBITAL 64.8 MG PO TABS: 64.8000 mg | ORAL_TABLET | Freq: Two times a day (BID) | ORAL | 1 refills | Status: AC

## 2024-08-25 MED ORDER — RISPERIDONE 2 MG PO TABS: 2.0000 mg | ORAL_TABLET | Freq: Every day | ORAL | 0 refills | Status: DC

## 2024-09-01 ENCOUNTER — Other Ambulatory Visit: Payer: Self-pay

## 2024-09-01 DIAGNOSIS — E785 Hyperlipidemia, unspecified: Secondary | ICD-10-CM

## 2024-09-01 DIAGNOSIS — I10 Essential (primary) hypertension: Secondary | ICD-10-CM

## 2024-09-01 DIAGNOSIS — E1140 Type 2 diabetes mellitus with diabetic neuropathy, unspecified: Secondary | ICD-10-CM

## 2024-09-01 DIAGNOSIS — F250 Schizoaffective disorder, bipolar type: Secondary | ICD-10-CM

## 2024-09-01 DIAGNOSIS — B20 Human immunodeficiency virus [HIV] disease: Secondary | ICD-10-CM

## 2024-09-01 DIAGNOSIS — E119 Type 2 diabetes mellitus without complications: Secondary | ICD-10-CM

## 2024-09-01 NOTE — Telephone Encounter (Signed)
 Pt is requesting a refill on all medication. Need more info. Please advise [facility]

## 2024-09-01 NOTE — Telephone Encounter (Signed)
 Approval given. [facility]

## 2024-09-02 ENCOUNTER — Other Ambulatory Visit: Payer: Self-pay | Admitting: Nurse Practitioner

## 2024-09-02 DIAGNOSIS — E119 Type 2 diabetes mellitus without complications: Secondary | ICD-10-CM

## 2024-09-02 DIAGNOSIS — I10 Essential (primary) hypertension: Secondary | ICD-10-CM

## 2024-09-02 DIAGNOSIS — F250 Schizoaffective disorder, bipolar type: Secondary | ICD-10-CM

## 2024-09-02 MED ORDER — DIVALPROEX SODIUM 500 MG PO DR TAB: 500.0000 mg | DELAYED_RELEASE_TABLET | Freq: Three times a day (TID) | ORAL | 0 refills | Status: AC

## 2024-09-02 MED ORDER — CYCLOBENZAPRINE HCL 10 MG PO TABS: 10.0000 mg | ORAL_TABLET | Freq: Three times a day (TID) | ORAL | 0 refills | Status: AC | PRN

## 2024-09-02 MED ORDER — AMLODIPINE BESYLATE 10 MG PO TABS: 10.0000 mg | ORAL_TABLET | Freq: Every day | ORAL | 0 refills | Status: AC

## 2024-09-02 MED ORDER — METFORMIN HCL 500 MG PO TABS: 500.0000 mg | ORAL_TABLET | Freq: Two times a day (BID) | ORAL | 0 refills | Status: AC

## 2024-09-02 MED ORDER — CLONIDINE HCL 0.2 MG PO TABS: 0.2000 mg | ORAL_TABLET | Freq: Two times a day (BID) | ORAL | 0 refills | Status: DC

## 2024-09-02 NOTE — Telephone Encounter (Signed)
 metFORMIN  (GLUCOPHAGE ) 500 MG tablet [Pharmacy Med Name: METFORMIN  500MG  TABLETS]

## 2024-09-04 ENCOUNTER — Inpatient Hospital Stay: Admitting: Nurse Practitioner

## 2024-09-04 NOTE — Telephone Encounter (Signed)
 Copied from CRM [id number]. Topic: Clinical - Home Health Verbal Orders >> Sep 04, 2024  2:31 PM Sophia H wrote: Caller/Agency: [facility]  Callback Number: [phone] Service Requested: Occupational Therapy Frequency: Reschedule eval to next week - Will has not been able to schedule the visit.  Any new concerns about the patient? No

## 2024-09-05 ENCOUNTER — Emergency Department (HOSPITAL_COMMUNITY)

## 2024-09-05 ENCOUNTER — Other Ambulatory Visit: Payer: Self-pay

## 2024-09-05 ENCOUNTER — Inpatient Hospital Stay (HOSPITAL_COMMUNITY)
Admission: EM | Admit: 2024-09-05 | Discharge: 2024-09-10 | DRG: 193 | Disposition: A | Discharge status: Home / Self Care

## 2024-09-05 DIAGNOSIS — Z79899 Other long term (current) drug therapy: Secondary | ICD-10-CM

## 2024-09-05 DIAGNOSIS — N179 Acute kidney failure, unspecified: Secondary | ICD-10-CM | POA: Diagnosis present

## 2024-09-05 DIAGNOSIS — Z888 Allergy status to other drugs, medicaments and biological substances status: Secondary | ICD-10-CM

## 2024-09-05 DIAGNOSIS — Z818 Family history of other mental and behavioral disorders: Secondary | ICD-10-CM

## 2024-09-05 DIAGNOSIS — Z7951 Long term (current) use of inhaled steroids: Secondary | ICD-10-CM

## 2024-09-05 DIAGNOSIS — F259 Schizoaffective disorder, unspecified: Secondary | ICD-10-CM | POA: Diagnosis present

## 2024-09-05 DIAGNOSIS — E119 Type 2 diabetes mellitus without complications: Secondary | ICD-10-CM | POA: Diagnosis present

## 2024-09-05 DIAGNOSIS — F1210 Cannabis abuse, uncomplicated: Secondary | ICD-10-CM | POA: Diagnosis present

## 2024-09-05 DIAGNOSIS — Z1152 Encounter for screening for COVID-19: Secondary | ICD-10-CM

## 2024-09-05 DIAGNOSIS — F250 Schizoaffective disorder, bipolar type: Secondary | ICD-10-CM

## 2024-09-05 DIAGNOSIS — B20 Human immunodeficiency virus [HIV] disease: Secondary | ICD-10-CM

## 2024-09-05 DIAGNOSIS — J189 Pneumonia, unspecified organism: Principal | ICD-10-CM | POA: Diagnosis present

## 2024-09-05 DIAGNOSIS — J159 Unspecified bacterial pneumonia: Principal | ICD-10-CM | POA: Diagnosis present

## 2024-09-05 DIAGNOSIS — Z6841 Body Mass Index (BMI) 40.0 and over, adult: Secondary | ICD-10-CM

## 2024-09-05 DIAGNOSIS — G40909 Epilepsy, unspecified, not intractable, without status epilepticus: Secondary | ICD-10-CM | POA: Diagnosis present

## 2024-09-05 DIAGNOSIS — Z794 Long term (current) use of insulin: Secondary | ICD-10-CM

## 2024-09-05 DIAGNOSIS — E041 Nontoxic single thyroid nodule: Secondary | ICD-10-CM | POA: Diagnosis present

## 2024-09-05 DIAGNOSIS — I110 Hypertensive heart disease with heart failure: Secondary | ICD-10-CM | POA: Diagnosis present

## 2024-09-05 DIAGNOSIS — R0902 Hypoxemia: Secondary | ICD-10-CM

## 2024-09-05 DIAGNOSIS — Z7984 Long term (current) use of oral hypoglycemic drugs: Secondary | ICD-10-CM

## 2024-09-05 DIAGNOSIS — E1140 Type 2 diabetes mellitus with diabetic neuropathy, unspecified: Principal | ICD-10-CM

## 2024-09-05 DIAGNOSIS — I509 Heart failure, unspecified: Secondary | ICD-10-CM

## 2024-09-05 DIAGNOSIS — N170 Acute kidney failure with tubular necrosis: Secondary | ICD-10-CM

## 2024-09-05 DIAGNOSIS — E7800 Pure hypercholesterolemia, unspecified: Secondary | ICD-10-CM | POA: Diagnosis present

## 2024-09-05 DIAGNOSIS — Z21 Asymptomatic human immunodeficiency virus [HIV] infection status: Secondary | ICD-10-CM | POA: Diagnosis present

## 2024-09-05 DIAGNOSIS — H5462 Unqualified visual loss, left eye, normal vision right eye: Secondary | ICD-10-CM | POA: Diagnosis present

## 2024-09-05 DIAGNOSIS — J45901 Unspecified asthma with (acute) exacerbation: Secondary | ICD-10-CM | POA: Diagnosis present

## 2024-09-05 DIAGNOSIS — E66813 Obesity, class 3: Secondary | ICD-10-CM | POA: Diagnosis present

## 2024-09-05 DIAGNOSIS — F411 Generalized anxiety disorder: Secondary | ICD-10-CM

## 2024-09-05 DIAGNOSIS — G8929 Other chronic pain: Secondary | ICD-10-CM | POA: Diagnosis present

## 2024-09-05 DIAGNOSIS — F17210 Nicotine dependence, cigarettes, uncomplicated: Secondary | ICD-10-CM | POA: Diagnosis present

## 2024-09-05 DIAGNOSIS — R6510 Systemic inflammatory response syndrome (SIRS) of non-infectious origin without acute organ dysfunction: Secondary | ICD-10-CM

## 2024-09-05 DIAGNOSIS — I5033 Acute on chronic diastolic (congestive) heart failure: Secondary | ICD-10-CM | POA: Diagnosis present

## 2024-09-05 DIAGNOSIS — J9601 Acute respiratory failure with hypoxia: Secondary | ICD-10-CM | POA: Diagnosis present

## 2024-09-05 DIAGNOSIS — I10 Essential (primary) hypertension: Secondary | ICD-10-CM

## 2024-09-05 DIAGNOSIS — K219 Gastro-esophageal reflux disease without esophagitis: Secondary | ICD-10-CM | POA: Diagnosis present

## 2024-09-05 DIAGNOSIS — E785 Hyperlipidemia, unspecified: Secondary | ICD-10-CM

## 2024-09-05 LAB — COMPREHENSIVE METABOLIC PANEL WITH GFR
ALT: 34 U/L (ref 0–44)
AST: 63 U/L — ABNORMAL HIGH (ref 15–41)
Albumin: 3.8 g/dL (ref 3.5–5.0)
Alkaline Phosphatase: 142 U/L — ABNORMAL HIGH (ref 38–126)
Anion gap: 14 (ref 5–15)
BUN: 18 mg/dL (ref 8–23)
CO2: 25 mmol/L (ref 22–32)
Calcium: 8.9 mg/dL (ref 8.9–10.3)
Chloride: 100 mmol/L (ref 98–111)
Creatinine, Ser: 1.12 mg/dL — ABNORMAL HIGH (ref 0.44–1.00)
GFR, Estimated: 55 mL/min — ABNORMAL LOW
Glucose, Bld: 147 mg/dL — ABNORMAL HIGH (ref 70–99)
Potassium: 4.5 mmol/L (ref 3.5–5.1)
Sodium: 139 mmol/L (ref 135–145)
Total Bilirubin: 0.3 mg/dL (ref 0.0–1.2)
Total Protein: 7.9 g/dL (ref 6.5–8.1)

## 2024-09-05 LAB — PRO BRAIN NATRIURETIC PEPTIDE: Pro Brain Natriuretic Peptide: 417 pg/mL — ABNORMAL HIGH

## 2024-09-05 LAB — CBC WITH DIFFERENTIAL/PLATELET
Abs Immature Granulocytes: 0.19 K/uL — ABNORMAL HIGH (ref 0.00–0.07)
Basophils Absolute: 0 K/uL (ref 0.0–0.1)
Basophils Relative: 1 %
Eosinophils Absolute: 0 K/uL (ref 0.0–0.5)
Eosinophils Relative: 1 %
HCT: 47.5 % — ABNORMAL HIGH (ref 36.0–46.0)
Hemoglobin: 15.7 g/dL — ABNORMAL HIGH (ref 12.0–15.0)
Immature Granulocytes: 3 %
Lymphocytes Relative: 35 %
Lymphs Abs: 2.3 K/uL (ref 0.7–4.0)
MCH: 32.9 pg (ref 26.0–34.0)
MCHC: 33.1 g/dL (ref 30.0–36.0)
MCV: 99.6 fL (ref 80.0–100.0)
Monocytes Absolute: 0.5 K/uL (ref 0.1–1.0)
Monocytes Relative: 7 %
Neutro Abs: 3.4 K/uL (ref 1.7–7.7)
Neutrophils Relative %: 53 %
Platelets: 149 K/uL — ABNORMAL LOW (ref 150–400)
RBC: 4.77 MIL/uL (ref 3.87–5.11)
RDW: 14.6 % (ref 11.5–15.5)
WBC: 6.4 K/uL (ref 4.0–10.5)
nRBC: 0 % (ref 0.0–0.2)

## 2024-09-05 LAB — RESP PANEL BY RT-PCR (RSV, FLU A&B, COVID)  RVPGX2
Influenza A by PCR: NEGATIVE
Influenza B by PCR: NEGATIVE
Resp Syncytial Virus by PCR: NEGATIVE
SARS Coronavirus 2 by RT PCR: NEGATIVE

## 2024-09-05 LAB — I-STAT CG4 LACTIC ACID, ED: Lactic Acid, Venous: 1.8 mmol/L (ref 0.5–1.9)

## 2024-09-05 LAB — PROTIME-INR
INR: 0.9 (ref 0.8–1.2)
Prothrombin Time: 12.6 s (ref 11.4–15.2)

## 2024-09-05 MED ORDER — SODIUM CHLORIDE 0.9 % IV SOLN
500.0000 mg | Freq: Once | INTRAVENOUS | Status: AC
  Administered 2024-09-05: 500 mg via INTRAVENOUS
  Filled 2024-09-05: qty 5

## 2024-09-05 MED ORDER — ACETAMINOPHEN 10 MG/ML IV SOLN
1000.0000 mg | Freq: Four times a day (QID) | INTRAVENOUS | Status: DC
  Administered 2024-09-05: 1000 mg via INTRAVENOUS
  Filled 2024-09-05: qty 100

## 2024-09-05 MED ORDER — METHYLPREDNISOLONE SODIUM SUCC 125 MG IJ SOLR
125.0000 mg | Freq: Once | INTRAMUSCULAR | Status: AC
  Administered 2024-09-05: 125 mg via INTRAVENOUS
  Filled 2024-09-05: qty 2

## 2024-09-05 MED ORDER — IPRATROPIUM-ALBUTEROL 0.5-2.5 (3) MG/3ML IN SOLN
3.0000 mL | Freq: Once | RESPIRATORY_TRACT | Status: AC
  Administered 2024-09-05: 3 mL via RESPIRATORY_TRACT
  Filled 2024-09-05: qty 3

## 2024-09-05 MED ORDER — SODIUM CHLORIDE 0.9 % IV SOLN
1.0000 g | Freq: Once | INTRAVENOUS | Status: AC
  Administered 2024-09-05: 1 g via INTRAVENOUS
  Filled 2024-09-05: qty 10

## 2024-09-05 NOTE — ED Notes (Signed)
 Assumed care of patient. Patient was readjusted in bed. Given something to drink. Patient is alert and oriented to her name and birthday. She knows she in the hospital in [place]. Patient can follow commands and roll from side to side with assistance.

## 2024-09-05 NOTE — ED Triage Notes (Addendum)
 Pt BIB [facility] from home d/t resp distress & generalized weakness & non productive cough for approx. 1 week & decreased mobility at home. Was 85% on RA, wheezing/rhochi in all lobes with 38 resp, 100% while receiving 10mg  Albuterol  Neb & 1 of Atrovent , 100 bpm, SBP 160, CBG 218. Does not wear home O2, confused at baseline (per family she is at her usual orientation), 12L good, Hx of COPD, CHF & Stroke.

## 2024-09-05 NOTE — ED Notes (Signed)
"  Xray at bedside.   "

## 2024-09-05 NOTE — ED Provider Notes (Signed)
 " [place] [facility] Provider Note   CSN: [id number] Arrival date & time: 09/05/24  1739     Patient presents with: SOB and Weakness   Susan [name] is a 64 y.o. female.   64 year old female with past medical history of diabetes, hypertension, hyperlipidemia, and HIV on Hart therapy presenting to the emergency department today with generalized malaise and cough.  The patient's family called about her because she was not feeling well.  When medics arrived the patient was satting at 85% on room air.  She was tachycardic and tachypneic.  She was given DuoNeb treatments on the way here she was having significant wheezing and did improve.  The patient is alert and oriented x 2 at baseline and is at her baseline per her family.   Weakness Associated symptoms: cough and shortness of breath        Prior to Admission medications  Medication Sig Start Date End Date Taking? Authorizing Provider  Accu-Chek Softclix Lancets lancets Use as instructed 10/30/23   Alvia Corean CROME, FNP  acetaminophen  (TYLENOL ) 500 MG tablet Take 1,000 mg by mouth every 6 (six) hours as needed for mild pain (pain score 1-3), moderate pain (pain score 4-6) or fever.    [provider]  albuterol  (PROVENTIL ) (2.5 MG/3ML) 0.083% nebulizer solution Take 3 mLs (2.5 mg total) by nebulization every 6 (six) hours as needed for wheezing or shortness of breath. Patient not taking: Reported on 08/03/2024 06/03/24   Oley Bascom RAMAN, NP  amLODipine  (NORVASC ) 10 MG tablet Take 1 tablet (10 mg total) by mouth daily. 09/02/24   Oley Bascom RAMAN, NP  amoxicillin -clavulanate (AUGMENTIN ) 875-125 MG tablet Take 1 tablet by mouth 2 (two) times daily. 08/03/24   Azadegan, Maryam, MD  cloNIDine  (CATAPRES ) 0.2 MG tablet Take 1 tablet (0.2 mg total) by mouth 2 (two) times daily. 09/02/24   Oley Bascom RAMAN, NP  cyclobenzaprine  (FLEXERIL ) 10 MG tablet Take 1 tablet (10 mg total) by mouth 3 (three)  times daily as needed for muscle spasms. 09/02/24   Oley Bascom RAMAN, NP  divalproex  (DEPAKOTE ) 500 MG DR tablet Take 1 tablet (500 mg total) by mouth 3 (three) times daily. 09/02/24   Oley Bascom RAMAN, NP  gabapentin  (NEURONTIN ) 300 MG capsule TAKE 1 CAPSULE(300 MG) BY MOUTH THREE TIMES DAILY 08/03/24   Nichols, Tonya S, NP  glucose blood (ACCU-CHEK GUIDE TEST) test strip Use as instructed 02/18/24   Gladis Elsie BROCKS, PA-C  guaiFENesin  (MUCINEX ) 600 MG 12 hr tablet Take 1 tablet (600 mg total) by mouth 2 (two) times daily. 08/03/24   Azadegan, Maryam, MD  [Paused] insulin  glargine (LANTUS  SOLOSTAR) 100 UNIT/ML Solostar Pen Inject 15 Units into the skin daily. Wait to take this until your doctor or other care provider tells you to start again. 06/03/24   Oley Bascom RAMAN, NP  Insulin  Pen Needle 31G X 8 MM MISC 1 each by Does not apply route as needed. Use as directed 06/16/24   Oley Bascom RAMAN, NP  Lacosamide  150 MG TABS Take 1 tablet (150 mg total) by mouth 2 (two) times daily. 05/11/24   Oley Bascom RAMAN, NP  [Paused] lactulose  (CHRONULAC ) 10 GM/15ML solution Take 20 g by mouth daily as needed for mild constipation, moderate constipation or severe constipation. Wait to take this until your doctor or other care provider tells you to start again. 10/25/22   [provider]  levETIRAcetam  (KEPPRA ) 1000 MG tablet Take 1 tablet (1,000  mg total) by mouth 2 (two) times daily. 06/03/24   Oley Bascom RAMAN, NP  LORazepam  (ATIVAN ) 0.5 MG tablet TAKE 1 TABLET(0.5 MG) BY MOUTH EVERY 6 HOURS AS NEEDED FOR ANXIETY Patient taking differently: Take 0.5 mg by mouth daily as needed for anxiety. 12/13/23   Alvia Corean CROME, FNP  metFORMIN  (GLUCOPHAGE ) 500 MG tablet Take 1 tablet (500 mg total) by mouth 2 (two) times daily. 09/02/24   Oley Bascom RAMAN, NP  omeprazole  (PRILOSEC) 40 MG capsule TAKE 1 CAPSULE(40 MG) BY MOUTH DAILY 07/09/24   Nichols, Tonya S, NP  PHENobarbital  (LUMINAL) 64.8 MG tablet Take 1 tablet  (64.8 mg total) by mouth 2 (two) times daily. TAKE 1 TABLET(64.8 MG) BY MOUTH TWICE DAILY 08/25/24   Nichols, Tonya S, NP  risperiDONE  (RISPERDAL ) 2 MG tablet Take 1 tablet (2 mg total) by mouth at bedtime. 08/25/24   Oley Bascom RAMAN, NP  rosuvastatin  (CRESTOR ) 20 MG tablet TAKE 1 TABLET(20 MG) BY MOUTH DAILY 07/09/24   Nichols, Tonya S, NP  sertraline  (ZOLOFT ) 100 MG tablet Take 1 tablet (100 mg total) by mouth at bedtime. 04/27/24   Vivienne Delon HERO, PA-C  TIVICAY  50 MG tablet TAKE 1 TABLET(50 MG) BY MOUTH DAILY 08/03/24   Nichols, Tonya S, NP  triamterene -hydrochlorothiazide  (MAXZIDE -25) 37.5-25 MG tablet Take 1 tablet by mouth daily. 06/03/24   Oley Bascom RAMAN, NP  TRIUMEQ  600-50-300 MG tablet TAKE 1 TABLET BY MOUTH EVERY MORNING 07/15/24   Oley Bascom RAMAN, NP    Allergies: Lyrica  [pregabalin ]    Review of Systems  Respiratory:  Positive for cough and shortness of breath.   Neurological:  Positive for weakness.  All other systems reviewed and are negative.   Updated Vital Signs BP 125/79   Pulse 95   Temp 100.3 F (37.9 C) (Oral)   Resp 18   Ht 5' 2 (1.575 m)   Wt 118 kg   LMP  (LMP Unknown) Comment: neg hcg 07/20/20  SpO2 100%   BMI 47.58 kg/m   Physical Exam Vitals and nursing note reviewed.   Gen: Ill-appearing in no acute distress Eyes: PERRL, EOMI HEENT: no oropharyngeal swelling Neck: trachea midline, no meningismus Resp: Wheezes in bilateral lower lung fields Card: Tachycardic, no murmurs, rubs, or gallops Abd: nontender, nondistended Extremities: no calf tenderness, no edema Vascular: 2+ radial pulses bilaterally, 2+ DP pulses bilaterally Neuro: No focal deficits Skin: no rashes Psyc: acting appropriately   (all labs ordered are listed, but only abnormal results are displayed) Labs Reviewed  COMPREHENSIVE METABOLIC PANEL WITH GFR - Abnormal; Notable for the following components:      Result Value   Glucose, Bld 147 (*)    Creatinine, Ser 1.12 (*)     AST 63 (*)    Alkaline Phosphatase 142 (*)    GFR, Estimated 55 (*)    All other components within normal limits  CBC WITH DIFFERENTIAL/PLATELET - Abnormal; Notable for the following components:   Hemoglobin 15.7 (*)    HCT 47.5 (*)    Platelets 149 (*)    Abs Immature Granulocytes 0.19 (*)    All other components within normal limits  PRO BRAIN NATRIURETIC PEPTIDE - Abnormal; Notable for the following components:   Pro Brain Natriuretic Peptide 417.0 (*)    All other components within normal limits  RESP PANEL BY RT-PCR (RSV, FLU A&B, COVID)  RVPGX2  CULTURE, BLOOD (ROUTINE X 2)  CULTURE, BLOOD (ROUTINE X 2)  PROTIME-INR  I-STAT CG4 LACTIC ACID, ED  EKG: EKG Interpretation Date/Time:  Saturday September 05 2024 18:23:22 EST Ventricular Rate:  108 PR Interval:  122 QRS Duration:  77 QT Interval:  319 QTC Calculation: 428 R Axis:   69  Text Interpretation: Sinus tachycardia Nonspecific T abnormalities, lateral leads Confirmed by Ula Barter [phone]) on 09/05/2024 6:25:26 PM  Radiology: [facility] Chest Port 1 View Result Date: 09/05/2024 EXAM: 1 VIEW(S) XRAY OF THE CHEST 09/05/2024 06:36:00 PM COMPARISON: 08/01/2024 CLINICAL HISTORY: Questionable sepsis - evaluate for abnormality FINDINGS: LUNGS AND PLEURA: Diffuse interstitial prominence throughout the lungs. Bibasilar focal opacities could reflect atelectasis or infiltrates, left greater than right. No pleural effusion. No pneumothorax. HEART AND MEDIASTINUM: No acute abnormality of the cardiac and mediastinal silhouettes. BONES AND SOFT TISSUES: No acute osseous abnormality. IMPRESSION: 1. Bibasilar focal opacities, left greater than right, possibly representing atelectasis or infiltrates. 2. Diffuse interstitial prominence throughout the lungs. This could reflect edema or atypical infection. Electronically signed by: Franky Crease MD 09/05/2024 07:13 PM EST RP Workstation: [id number]     Procedures   Medications Ordered in the ED   acetaminophen  (OFIRMEV ) IV 1,000 mg (0 mg Intravenous Stopped 09/05/24 1855)  ipratropium-albuterol  (DUONEB) 0.5-2.5 (3) MG/3ML nebulizer solution 3 mL (3 mLs Nebulization Given 09/05/24 1822)  methylPREDNISolone  sodium succinate (SOLU-MEDROL ) 125 mg/2 mL injection 125 mg (125 mg Intravenous Given 09/05/24 1831)  cefTRIAXone  (ROCEPHIN ) 1 g in sodium chloride  0.9 % 100 mL IVPB (0 g Intravenous Stopped 09/05/24 2044)  azithromycin  (ZITHROMAX ) 500 mg in sodium chloride  0.9 % 250 mL IVPB (500 mg Intravenous New Bag/Given 09/05/24 2050)                                    Medical Decision Making 64 year old female with past medical history of diabetes, HIV on Hart therapy, hypertension, and schizoaffective disorder presenting to the emergency department today with cough and shortness of breath.  Patient is tachycardic but did receive some albuterol  here.  She is wheezing here on exam.  Will further evaluate the patient here with a sepsis workup.  Given this being flu season will hold off on antibiotics until her flu swab is resulted.  I will give patient Solu-Medrol  and DuoNebs here and reevaluate.  Suspect she will likely require admission.  The patient's labs here are reassuring.  Flu and COVID test were negative.  Patient did become hypoxic again here.  Chest x-ray does show questionable pneumonia.  She did have a fever here as well.  She is covered with Rocephin  and azithromycin .  Lactate is within normal limits so additional IV fluids are not ordered.  A call was placed to hospital service for admission.  Amount and/or Complexity of Data Reviewed Labs: ordered. Radiology: ordered.  Risk Prescription drug management. Decision regarding hospitalization.        Final diagnoses:  Pneumonia due to infectious organism, unspecified laterality, unspecified part of lung  Hypoxia    ED Discharge Orders     None          Ula Barter SAUNDERS, MD 09/05/24 2154  "

## 2024-09-06 ENCOUNTER — Other Ambulatory Visit: Payer: Self-pay | Admitting: Nurse Practitioner

## 2024-09-06 DIAGNOSIS — F17210 Nicotine dependence, cigarettes, uncomplicated: Secondary | ICD-10-CM | POA: Diagnosis present

## 2024-09-06 DIAGNOSIS — Z21 Asymptomatic human immunodeficiency virus [HIV] infection status: Secondary | ICD-10-CM | POA: Diagnosis present

## 2024-09-06 DIAGNOSIS — E1140 Type 2 diabetes mellitus with diabetic neuropathy, unspecified: Secondary | ICD-10-CM

## 2024-09-06 DIAGNOSIS — I5033 Acute on chronic diastolic (congestive) heart failure: Secondary | ICD-10-CM | POA: Diagnosis present

## 2024-09-06 DIAGNOSIS — I509 Heart failure, unspecified: Secondary | ICD-10-CM

## 2024-09-06 DIAGNOSIS — J45901 Unspecified asthma with (acute) exacerbation: Secondary | ICD-10-CM | POA: Diagnosis present

## 2024-09-06 DIAGNOSIS — H5462 Unqualified visual loss, left eye, normal vision right eye: Secondary | ICD-10-CM | POA: Diagnosis present

## 2024-09-06 DIAGNOSIS — B20 Human immunodeficiency virus [HIV] disease: Secondary | ICD-10-CM | POA: Diagnosis present

## 2024-09-06 DIAGNOSIS — R6510 Systemic inflammatory response syndrome (SIRS) of non-infectious origin without acute organ dysfunction: Secondary | ICD-10-CM

## 2024-09-06 DIAGNOSIS — K219 Gastro-esophageal reflux disease without esophagitis: Secondary | ICD-10-CM | POA: Diagnosis present

## 2024-09-06 DIAGNOSIS — Z79899 Other long term (current) drug therapy: Secondary | ICD-10-CM | POA: Diagnosis not present

## 2024-09-06 DIAGNOSIS — Z6841 Body Mass Index (BMI) 40.0 and over, adult: Secondary | ICD-10-CM | POA: Diagnosis not present

## 2024-09-06 DIAGNOSIS — N179 Acute kidney failure, unspecified: Secondary | ICD-10-CM | POA: Diagnosis present

## 2024-09-06 DIAGNOSIS — E7800 Pure hypercholesterolemia, unspecified: Secondary | ICD-10-CM | POA: Diagnosis present

## 2024-09-06 DIAGNOSIS — Z7984 Long term (current) use of oral hypoglycemic drugs: Secondary | ICD-10-CM | POA: Diagnosis not present

## 2024-09-06 DIAGNOSIS — Z7951 Long term (current) use of inhaled steroids: Secondary | ICD-10-CM | POA: Diagnosis not present

## 2024-09-06 DIAGNOSIS — I5023 Acute on chronic systolic (congestive) heart failure: Secondary | ICD-10-CM | POA: Diagnosis not present

## 2024-09-06 DIAGNOSIS — Z794 Long term (current) use of insulin: Secondary | ICD-10-CM | POA: Diagnosis not present

## 2024-09-06 DIAGNOSIS — E66813 Obesity, class 3: Secondary | ICD-10-CM | POA: Diagnosis present

## 2024-09-06 DIAGNOSIS — G8929 Other chronic pain: Secondary | ICD-10-CM | POA: Diagnosis present

## 2024-09-06 DIAGNOSIS — J189 Pneumonia, unspecified organism: Secondary | ICD-10-CM | POA: Diagnosis present

## 2024-09-06 DIAGNOSIS — J9601 Acute respiratory failure with hypoxia: Secondary | ICD-10-CM | POA: Diagnosis present

## 2024-09-06 DIAGNOSIS — Z1152 Encounter for screening for COVID-19: Secondary | ICD-10-CM | POA: Diagnosis not present

## 2024-09-06 DIAGNOSIS — G40909 Epilepsy, unspecified, not intractable, without status epilepticus: Secondary | ICD-10-CM | POA: Diagnosis present

## 2024-09-06 DIAGNOSIS — F1210 Cannabis abuse, uncomplicated: Secondary | ICD-10-CM | POA: Diagnosis present

## 2024-09-06 DIAGNOSIS — J159 Unspecified bacterial pneumonia: Secondary | ICD-10-CM | POA: Diagnosis present

## 2024-09-06 DIAGNOSIS — E119 Type 2 diabetes mellitus without complications: Secondary | ICD-10-CM | POA: Diagnosis present

## 2024-09-06 DIAGNOSIS — E041 Nontoxic single thyroid nodule: Secondary | ICD-10-CM | POA: Diagnosis present

## 2024-09-06 DIAGNOSIS — F259 Schizoaffective disorder, unspecified: Secondary | ICD-10-CM | POA: Diagnosis present

## 2024-09-06 DIAGNOSIS — I110 Hypertensive heart disease with heart failure: Secondary | ICD-10-CM | POA: Diagnosis present

## 2024-09-06 LAB — BLOOD CULTURE ID PANEL (REFLEXED) - BCID2

## 2024-09-06 LAB — COMPREHENSIVE METABOLIC PANEL WITH GFR
ALT: 34 U/L (ref 0–44)
AST: 47 U/L — ABNORMAL HIGH (ref 15–41)
Albumin: 3.4 g/dL — ABNORMAL LOW (ref 3.5–5.0)
Alkaline Phosphatase: 117 U/L (ref 38–126)
Anion gap: 13 (ref 5–15)
BUN: 23 mg/dL (ref 8–23)
CO2: 25 mmol/L (ref 22–32)
Calcium: 8.2 mg/dL — ABNORMAL LOW (ref 8.9–10.3)
Chloride: 101 mmol/L (ref 98–111)
Creatinine, Ser: 1.1 mg/dL — ABNORMAL HIGH (ref 0.44–1.00)
GFR, Estimated: 57 mL/min — ABNORMAL LOW
Glucose, Bld: 273 mg/dL — ABNORMAL HIGH (ref 70–99)
Potassium: 4.2 mmol/L (ref 3.5–5.1)
Sodium: 140 mmol/L (ref 135–145)
Total Bilirubin: <0.2 mg/dL (ref 0.0–1.2)
Total Protein: 6.7 g/dL (ref 6.5–8.1)

## 2024-09-06 LAB — RESPIRATORY PANEL BY PCR

## 2024-09-06 LAB — PROCALCITONIN: Procalcitonin: <0.1 ng/mL

## 2024-09-06 LAB — CBG MONITORING, ED
Glucose-Capillary: 264 mg/dL — ABNORMAL HIGH (ref 70–99)
Glucose-Capillary: 214 mg/dL — ABNORMAL HIGH (ref 70–99)
Glucose-Capillary: 153 mg/dL — ABNORMAL HIGH (ref 70–99)

## 2024-09-06 LAB — CBC
HCT: 41.1 % (ref 36.0–46.0)
Hemoglobin: 13.4 g/dL (ref 12.0–15.0)
MCH: 32.5 pg (ref 26.0–34.0)
MCHC: 32.6 g/dL (ref 30.0–36.0)
MCV: 99.8 fL (ref 80.0–100.0)
Platelets: 124 K/uL — ABNORMAL LOW (ref 150–400)
RBC: 4.12 MIL/uL (ref 3.87–5.11)
RDW: 14.6 % (ref 11.5–15.5)
WBC: 5.2 K/uL (ref 4.0–10.5)
nRBC: 0 % (ref 0.0–0.2)

## 2024-09-06 LAB — GLUCOSE, CAPILLARY
Glucose-Capillary: 174 mg/dL — ABNORMAL HIGH (ref 70–99)
Glucose-Capillary: 248 mg/dL — ABNORMAL HIGH (ref 70–99)

## 2024-09-06 MED ORDER — ALBUTEROL SULFATE (2.5 MG/3ML) 0.083% IN NEBU: 2.5000 mg | INHALATION_SOLUTION | RESPIRATORY_TRACT | Status: DC | PRN

## 2024-09-06 MED ORDER — VANCOMYCIN HCL 2000 MG/400ML IV SOLN
2000.0000 mg | Freq: Once | INTRAVENOUS | Status: AC
  Administered 2024-09-06: 2000 mg via INTRAVENOUS
  Filled 2024-09-06: qty 400

## 2024-09-06 MED ORDER — TRIAMTERENE-HCTZ 37.5-25 MG PO TABS
1.0000 | ORAL_TABLET | Freq: Every day | ORAL | Status: DC
  Administered 2024-09-06 – 2024-09-08 (×3): 1 via ORAL
  Filled 2024-09-06 (×4): qty 1

## 2024-09-06 MED ORDER — CLONIDINE HCL 0.1 MG PO TABS
0.2000 mg | ORAL_TABLET | Freq: Two times a day (BID) | ORAL | Status: DC
  Administered 2024-09-06 – 2024-09-10 (×9): 0.2 mg via ORAL
  Filled 2024-09-06 (×5): qty 2
  Filled 2024-09-06: qty 1
  Filled 2024-09-06 (×3): qty 2

## 2024-09-06 MED ORDER — ENOXAPARIN SODIUM 60 MG/0.6ML IJ SOSY
50.0000 mg | PREFILLED_SYRINGE | INTRAMUSCULAR | Status: DC
  Administered 2024-09-06 – 2024-09-10 (×5): 50 mg via SUBCUTANEOUS
  Filled 2024-09-06 (×5): qty 0.6

## 2024-09-06 MED ORDER — ACETAMINOPHEN 650 MG RE SUPP: 650.0000 mg | Freq: Four times a day (QID) | RECTAL | Status: DC | PRN

## 2024-09-06 MED ORDER — BUDESONIDE 0.25 MG/2ML IN SUSP
0.2500 mg | Freq: Two times a day (BID) | RESPIRATORY_TRACT | Status: DC
  Administered 2024-09-06: 0.25 mg via RESPIRATORY_TRACT
  Filled 2024-09-06: qty 2

## 2024-09-06 MED ORDER — METHYLPREDNISOLONE SODIUM SUCC 40 MG IJ SOLR
40.0000 mg | Freq: Two times a day (BID) | INTRAMUSCULAR | Status: DC
  Administered 2024-09-06 – 2024-09-09 (×8): 40 mg via INTRAVENOUS
  Filled 2024-09-06 (×8): qty 1

## 2024-09-06 MED ORDER — LEVETIRACETAM 500 MG PO TABS
1000.0000 mg | ORAL_TABLET | Freq: Two times a day (BID) | ORAL | Status: DC
  Administered 2024-09-06 – 2024-09-10 (×10): 1000 mg via ORAL
  Filled 2024-09-06 (×10): qty 2

## 2024-09-06 MED ORDER — LORAZEPAM 0.5 MG PO TABS
0.5000 mg | ORAL_TABLET | Freq: Every day | ORAL | Status: DC | PRN
  Administered 2024-09-07: 0.5 mg via ORAL
  Filled 2024-09-06: qty 1

## 2024-09-06 MED ORDER — INSULIN ASPART 100 UNIT/ML IJ SOLN
0.0000 [IU] | Freq: Every day | INTRAMUSCULAR | Status: DC
  Administered 2024-09-06: 2 [IU] via SUBCUTANEOUS
  Administered 2024-09-06: 3 [IU] via SUBCUTANEOUS
  Administered 2024-09-08: 2 [IU] via SUBCUTANEOUS
  Administered 2024-09-08: 3 [IU] via SUBCUTANEOUS
  Administered 2024-09-09: 4 [IU] via SUBCUTANEOUS
  Filled 2024-09-06: qty 2
  Filled 2024-09-06: qty 3
  Filled 2024-09-06: qty 4
  Filled 2024-09-06: qty 2
  Filled 2024-09-06 (×2): qty 3

## 2024-09-06 MED ORDER — BUDESONIDE 0.25 MG/2ML IN SUSP
0.2500 mg | Freq: Two times a day (BID) | RESPIRATORY_TRACT | Status: DC
  Administered 2024-09-06 – 2024-09-10 (×8): 0.25 mg via RESPIRATORY_TRACT
  Filled 2024-09-06 (×9): qty 2

## 2024-09-06 MED ORDER — FUROSEMIDE 10 MG/ML IJ SOLN
20.0000 mg | Freq: Once | INTRAMUSCULAR | Status: AC
  Administered 2024-09-06: 20 mg via INTRAVENOUS
  Filled 2024-09-06: qty 2

## 2024-09-06 MED ORDER — SERTRALINE HCL 100 MG PO TABS
100.0000 mg | ORAL_TABLET | Freq: Every day | ORAL | Status: DC
  Administered 2024-09-06 – 2024-09-09 (×5): 100 mg via ORAL
  Filled 2024-09-06 (×5): qty 1

## 2024-09-06 MED ORDER — ROSUVASTATIN CALCIUM 20 MG PO TABS
20.0000 mg | ORAL_TABLET | Freq: Every day | ORAL | Status: DC
  Administered 2024-09-06 – 2024-09-10 (×5): 20 mg via ORAL
  Filled 2024-09-06 (×5): qty 1

## 2024-09-06 MED ORDER — IPRATROPIUM-ALBUTEROL 0.5-2.5 (3) MG/3ML IN SOLN
3.0000 mL | Freq: Four times a day (QID) | RESPIRATORY_TRACT | Status: DC
  Administered 2024-09-06 – 2024-09-07 (×6): 3 mL via RESPIRATORY_TRACT
  Filled 2024-09-06 (×6): qty 3

## 2024-09-06 MED ORDER — RISPERIDONE 1 MG PO TABS
2.0000 mg | ORAL_TABLET | Freq: Every day | ORAL | Status: DC
  Administered 2024-09-06 – 2024-09-09 (×5): 2 mg via ORAL
  Filled 2024-09-06: qty 1
  Filled 2024-09-06 (×3): qty 2
  Filled 2024-09-06: qty 1
  Filled 2024-09-06: qty 2

## 2024-09-06 MED ORDER — CYCLOBENZAPRINE HCL 10 MG PO TABS: 10.0000 mg | ORAL_TABLET | Freq: Three times a day (TID) | ORAL | Status: DC | PRN

## 2024-09-06 MED ORDER — DIVALPROEX SODIUM 500 MG PO DR TAB
500.0000 mg | DELAYED_RELEASE_TABLET | Freq: Three times a day (TID) | ORAL | Status: DC
  Administered 2024-09-06 – 2024-09-10 (×13): 500 mg via ORAL
  Filled 2024-09-06: qty 1
  Filled 2024-09-06: qty 2
  Filled 2024-09-06 (×12): qty 1

## 2024-09-06 MED ORDER — LACOSAMIDE 50 MG PO TABS
150.0000 mg | ORAL_TABLET | Freq: Two times a day (BID) | ORAL | Status: DC
  Administered 2024-09-06 – 2024-09-10 (×10): 150 mg via ORAL
  Filled 2024-09-06 (×10): qty 3

## 2024-09-06 MED ORDER — PANTOPRAZOLE SODIUM 40 MG PO TBEC
40.0000 mg | DELAYED_RELEASE_TABLET | Freq: Every day | ORAL | Status: DC
  Administered 2024-09-06 – 2024-09-10 (×5): 40 mg via ORAL
  Filled 2024-09-06 (×5): qty 1

## 2024-09-06 MED ORDER — DOLUTEGRAVIR SODIUM 50 MG PO TABS
50.0000 mg | ORAL_TABLET | Freq: Every day | ORAL | Status: DC
  Administered 2024-09-06 – 2024-09-10 (×5): 50 mg via ORAL
  Filled 2024-09-06 (×5): qty 1

## 2024-09-06 MED ORDER — ABACAVIR-DOLUTEGRAVIR-LAMIVUD 600-50-300 MG PO TABS
1.0000 | ORAL_TABLET | Freq: Every morning | ORAL | Status: DC
  Administered 2024-09-06 – 2024-09-10 (×5): 1 via ORAL
  Filled 2024-09-06 (×5): qty 1

## 2024-09-06 MED ORDER — GABAPENTIN 300 MG PO CAPS
300.0000 mg | ORAL_CAPSULE | Freq: Three times a day (TID) | ORAL | Status: DC
  Administered 2024-09-06 – 2024-09-10 (×13): 300 mg via ORAL
  Filled 2024-09-06 (×13): qty 1

## 2024-09-06 MED ORDER — AMLODIPINE BESYLATE 10 MG PO TABS
10.0000 mg | ORAL_TABLET | Freq: Every day | ORAL | Status: DC
  Administered 2024-09-06 – 2024-09-10 (×5): 10 mg via ORAL
  Filled 2024-09-06: qty 1
  Filled 2024-09-06: qty 2
  Filled 2024-09-06 (×3): qty 1

## 2024-09-06 MED ORDER — CLONIDINE HCL 0.2 MG PO TABS: 0.2000 mg | ORAL_TABLET | Freq: Two times a day (BID) | ORAL | Status: DC

## 2024-09-06 MED ORDER — SODIUM CHLORIDE 0.9 % IV SOLN
500.0000 mg | INTRAVENOUS | Status: DC
  Administered 2024-09-06 – 2024-09-07 (×2): 500 mg via INTRAVENOUS
  Filled 2024-09-06 (×2): qty 5

## 2024-09-06 MED ORDER — INSULIN ASPART 100 UNIT/ML IJ SOLN
0.0000 [IU] | Freq: Three times a day (TID) | INTRAMUSCULAR | Status: DC
  Administered 2024-09-06: 3 [IU] via SUBCUTANEOUS
  Administered 2024-09-06 (×2): 2 [IU] via SUBCUTANEOUS
  Administered 2024-09-07: 3 [IU] via SUBCUTANEOUS
  Administered 2024-09-07: 2 [IU] via SUBCUTANEOUS
  Administered 2024-09-07: 1 [IU] via SUBCUTANEOUS
  Administered 2024-09-08 (×2): 3 [IU] via SUBCUTANEOUS
  Administered 2024-09-08: 2 [IU] via SUBCUTANEOUS
  Administered 2024-09-09: 5 [IU] via SUBCUTANEOUS
  Administered 2024-09-09 (×2): 2 [IU] via SUBCUTANEOUS
  Administered 2024-09-10: 7 [IU] via SUBCUTANEOUS
  Filled 2024-09-06: qty 2
  Filled 2024-09-06: qty 3
  Filled 2024-09-06: qty 2
  Filled 2024-09-06 (×2): qty 3
  Filled 2024-09-06: qty 2
  Filled 2024-09-06: qty 5
  Filled 2024-09-06: qty 3
  Filled 2024-09-06: qty 1
  Filled 2024-09-06: qty 2
  Filled 2024-09-06: qty 3

## 2024-09-06 MED ORDER — SODIUM CHLORIDE 0.9 % IV SOLN: 1.0000 g | INTRAVENOUS | Status: DC

## 2024-09-06 MED ORDER — PHENOBARBITAL 32.4 MG PO TABS
64.8000 mg | ORAL_TABLET | Freq: Two times a day (BID) | ORAL | Status: DC
  Administered 2024-09-06 – 2024-09-10 (×10): 64.8 mg via ORAL
  Filled 2024-09-06 (×10): qty 2

## 2024-09-06 MED ORDER — ACETAMINOPHEN 325 MG PO TABS: 650.0000 mg | ORAL_TABLET | Freq: Four times a day (QID) | ORAL | Status: DC | PRN

## 2024-09-06 MED ORDER — GUAIFENESIN ER 600 MG PO TB12
600.0000 mg | ORAL_TABLET | Freq: Two times a day (BID) | ORAL | Status: DC
  Administered 2024-09-06 – 2024-09-10 (×10): 600 mg via ORAL
  Filled 2024-09-06 (×10): qty 1

## 2024-09-06 MED ORDER — SODIUM CHLORIDE 0.9 % IV SOLN
2.0000 g | INTRAVENOUS | Status: AC
  Administered 2024-09-06 – 2024-09-09 (×4): 2 g via INTRAVENOUS
  Filled 2024-09-06 (×4): qty 20

## 2024-09-06 NOTE — ED Notes (Signed)
 Patient resting in position of comfort easily aroused and is alert to self. Baseline according to initial notes at time of arrival confirmed by family with some confusion. Fall risk identifier on left wrist and non-skid socks on bilateral feet.

## 2024-09-06 NOTE — ED Notes (Signed)
 Pt found to be wet with urine and stool on chuck. Pt cleaned, bedding changed and brief placed on patient.

## 2024-09-06 NOTE — ED Notes (Signed)
 Patient resting in bed reports feeling okay. Presents with cough and heavy breathing but continues to report I'm fine.
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
## 2024-09-06 NOTE — Progress Notes (Addendum)
 PHARMACY - PHYSICIAN COMMUNICATION CRITICAL VALUE ALERT - BLOOD CULTURE IDENTIFICATION (BCID)  Susan [name] is an 64 y.o. female who presented to [facility] on 09/05/2024 with a chief complaint of cough  Assessment:  1 of 1 bottle growing staph species. This organism is likely a contaminant, but only 1 bottle drawn. Patient is afebrile, WBC wnl and procalcitonin negative.  Name of physician (or Provider) Contacted: Dr. Rosario  Current antibiotics: ceftriaxone , azithromycin   Changes to prescribed antibiotics recommended:  MD elects to give one dose of Vancomycin  due to temp 102.0 on admission. Will also increase ceftriaxone  dose to 2g.  Results for orders placed or performed during the hospital encounter of 09/05/24  Blood Culture ID Panel (Reflexed) (Collected: 09/05/2024  6:29 PM)  Result Value Ref Range   Enterococcus faecalis NOT DETECTED NOT DETECTED   Enterococcus Faecium NOT DETECTED NOT DETECTED   Listeria monocytogenes NOT DETECTED NOT DETECTED   Staphylococcus species DETECTED (A) NOT DETECTED   Staphylococcus aureus (BCID) NOT DETECTED NOT DETECTED   Staphylococcus epidermidis NOT DETECTED NOT DETECTED   Staphylococcus lugdunensis NOT DETECTED NOT DETECTED   Streptococcus species NOT DETECTED NOT DETECTED   Streptococcus agalactiae NOT DETECTED NOT DETECTED   Streptococcus pneumoniae NOT DETECTED NOT DETECTED   Streptococcus pyogenes NOT DETECTED NOT DETECTED   A.calcoaceticus-baumannii NOT DETECTED NOT DETECTED   Bacteroides fragilis NOT DETECTED NOT DETECTED   Enterobacterales NOT DETECTED NOT DETECTED   Enterobacter cloacae complex NOT DETECTED NOT DETECTED   Escherichia coli NOT DETECTED NOT DETECTED   Klebsiella aerogenes NOT DETECTED NOT DETECTED   Klebsiella oxytoca NOT DETECTED NOT DETECTED   Klebsiella pneumoniae NOT DETECTED NOT DETECTED   Proteus species NOT DETECTED NOT DETECTED   Salmonella species NOT DETECTED NOT DETECTED   Serratia marcescens NOT  DETECTED NOT DETECTED   Haemophilus influenzae NOT DETECTED NOT DETECTED   Neisseria meningitidis NOT DETECTED NOT DETECTED   Pseudomonas aeruginosa NOT DETECTED NOT DETECTED   Stenotrophomonas maltophilia NOT DETECTED NOT DETECTED   Candida albicans NOT DETECTED NOT DETECTED   Candida auris NOT DETECTED NOT DETECTED   Candida glabrata NOT DETECTED NOT DETECTED   Candida krusei NOT DETECTED NOT DETECTED   Candida parapsilosis NOT DETECTED NOT DETECTED   Candida tropicalis NOT DETECTED NOT DETECTED   Cryptococcus neoformans/gattii NOT DETECTED NOT DETECTED    Rocky Slade, PharmD, BCPS 09/06/2024  4:34 PM

## 2024-09-06 NOTE — Progress Notes (Signed)
 Brief progress note: - Patient was admitted earlier today. - As per H&P done on presentation: Susan [name] is a 64 y.o. female with medical history significant of HIV, hypertension, hyperlipidemia, migraine, asthma, type 2 diabetes, seizure disorder, prior CVA with residual deficits, left eye blindness, HFrecEF, marijuana abuse, GERD, anxiety, schizoaffective disorder, chronic low back pain/sciatica, tobacco abuse, GERD, thyroid  nodule.  Admitted to the hospital a month ago for multifocal pneumonia and breakthrough seizure.  Patient presents to the ED tonight via EMS for evaluation of cough, respiratory distress, and generalized weakness.  Oxygen saturation 85% on room air and was tachypneic to the high 30s.  She was wheezing and given albuterol  and Atrovent  by EMS prior to arrival.  Patient noted to be AAO x 2 and slightly confused which per family is her baseline.  In the ED, patient noted be febrile with temperature 102.9 F, tachycardic, and tachypneic on arrival.  Requiring 4 L [place] to maintain sats in the 90s.  Labs showing no leukocytosis, hemoglobin 15.7, platelet count 149k, glucose 147, creatinine 1.12 (close to baseline), AST 63, alk phos 142, ALT and T. bili normal, blood cultures in process, lactic acid normal, proBNP 417, COVID/influenza/RSV PCR negative.  Chest x-ray showing bibasilar focal opacities, left greater than right, possibly representing atelectasis or infiltrates.  Showing diffuse interstitial prominence throughout the lungs which could reflect edema or atypical infection.  Patient was given DuoNeb, Solu-Medrol  125, acetaminophen , ceftriaxone , and azithromycin  in the ED.  TRH called to admit.   Patient is currently AAO x 2.  She is not able to give much history.  Reports having cough and shortness of breath, unclear when her symptoms started.  She reports taking all of her medications at home but does not know which medications she takes as her daughter gives them to her.  Denies  fevers, chest pain, nausea, vomiting, abdominal pain, or diarrhea.  09/06/2024: Patient seen.  Altered mentation seems to be improving.  However, patient still not able to give detailed history.  1 blood culture is growing Staph aureus.  Repeat blood cultures.  Pharmacy to dose IV vancomycin  x 1 dose.  Further management depend on hospital course.

## 2024-09-06 NOTE — Plan of Care (Signed)

## 2024-09-07 ENCOUNTER — Inpatient Hospital Stay (HOSPITAL_COMMUNITY)

## 2024-09-07 DIAGNOSIS — I5023 Acute on chronic systolic (congestive) heart failure: Secondary | ICD-10-CM

## 2024-09-07 LAB — GLUCOSE, CAPILLARY
Glucose-Capillary: 226 mg/dL — ABNORMAL HIGH (ref 70–99)
Glucose-Capillary: 153 mg/dL — ABNORMAL HIGH (ref 70–99)
Glucose-Capillary: 140 mg/dL — ABNORMAL HIGH (ref 70–99)
Glucose-Capillary: 235 mg/dL — ABNORMAL HIGH (ref 70–99)

## 2024-09-07 LAB — ECHOCARDIOGRAM COMPLETE
Height: 62 in
Weight: 3622.6 [oz_av]

## 2024-09-07 MED ORDER — ALBUTEROL SULFATE (2.5 MG/3ML) 0.083% IN NEBU
2.5000 mg | INHALATION_SOLUTION | Freq: Four times a day (QID) | RESPIRATORY_TRACT | Status: DC | PRN
  Administered 2024-09-09: 2.5 mg via RESPIRATORY_TRACT
  Filled 2024-09-07: qty 3

## 2024-09-07 MED ORDER — TORSEMIDE 20 MG PO TABS
20.0000 mg | ORAL_TABLET | Freq: Every day | ORAL | Status: DC
  Administered 2024-09-07 – 2024-09-08 (×2): 20 mg via ORAL
  Filled 2024-09-07 (×2): qty 1

## 2024-09-07 MED ORDER — IPRATROPIUM-ALBUTEROL 0.5-2.5 (3) MG/3ML IN SOLN
3.0000 mL | Freq: Two times a day (BID) | RESPIRATORY_TRACT | Status: DC
  Administered 2024-09-07 – 2024-09-10 (×6): 3 mL via RESPIRATORY_TRACT
  Filled 2024-09-07 (×7): qty 3

## 2024-09-07 NOTE — Progress Notes (Signed)
 " PROGRESS NOTE    Susan [name]  FMW:[id number] DOB: 04-20-1961 DOA: 09/05/2024 PCP: Oley Bascom RAMAN, NP  Outpatient Specialists:     Brief Narrative:  - As per H&P done on presentation: Susan [name] is a 64 y.o. female with medical history significant of HIV, hypertension, hyperlipidemia, migraine, asthma, type 2 diabetes, seizure disorder, prior CVA with residual deficits, left eye blindness, HFrecEF, marijuana abuse, GERD, anxiety, schizoaffective disorder, chronic low back pain/sciatica, tobacco abuse, GERD, thyroid  nodule.  Admitted to the hospital a month ago for multifocal pneumonia and breakthrough seizure.  Patient presents to the ED tonight via EMS for evaluation of cough, respiratory distress, and generalized weakness.  Oxygen saturation 85% on room air and was tachypneic to the high 30s.  She was wheezing and given albuterol  and Atrovent  by EMS prior to arrival.  Patient noted to be AAO x 2 and slightly confused which per family is her baseline.  In the ED, patient noted be febrile with temperature 102.9 F, tachycardic, and tachypneic on arrival.  Requiring 4 L [place] to maintain sats in the 90s.  Labs showing no leukocytosis, hemoglobin 15.7, platelet count 149k, glucose 147, creatinine 1.12 (close to baseline), AST 63, alk phos 142, ALT and T. bili normal, blood cultures in process, lactic acid normal, proBNP 417, COVID/influenza/RSV PCR negative.  Chest x-ray showing bibasilar focal opacities, left greater than right, possibly representing atelectasis or infiltrates.  Showing diffuse interstitial prominence throughout the lungs which could reflect edema or atypical infection.  Patient was given DuoNeb, Solu-Medrol  125, acetaminophen , ceftriaxone , and azithromycin  in the ED.  TRH called to admit.   Patient is currently AAO x 2.  She is not able to give much history.  Reports having cough and shortness of breath, unclear when her symptoms started.  She reports taking all of her medications  at home but does not know which medications she takes as her daughter gives them to her.  Denies fevers, chest pain, nausea, vomiting, abdominal pain, or diarrhea.  09/07/2024: Patient seen.  Patient remains a poor historian.  Called patient's daughter, Dagny Fiorentino went to voicemail.  Patient has refused echocardiogram etc.  However, patient seems to be improving.  Likely discharge in the next 1 to 2 days.  Blood culture has grown Staphylococcus hominis, likely contaminant.  Continue IV Rocephin  and azithromycin  for now.   Assessment & Plan:   Principal Problem:   Asthma exacerbation Active Problems:   Acute hypoxemic respiratory failure (HCC)   Pneumonia   Acute exacerbation of CHF (congestive heart failure) (HCC)   SIRS (systemic inflammatory response syndrome) (HCC)   SIRS: Possible asthma without acute exacerbation: Acute hypoxemic respiratory failure: Possible pneumonia: - Continue nebs DuoNeb and IV steroids. - Continue azithromycin  and Rocephin . - Patient is slowly improving.  Acute on chronic HFrecEF: -Repeat echo (low quality).  However, normal left ventricular EF reported. -Torsemide  20 Mg p.o. once daily. - Continue to evaluate volume status daily, and manage.  HIV CD4 count 418 in February 2025.  Continue Tivicay  and Triumeq .   Hypertension -Continue to monitor and optimize.   Hyperlipidemia Continue Crestor .   Type 2 diabetes Hemoglobin A1c 6.7 in September 2025.  Patient is no longer on Lantus  at home.  Placed on sensitive sliding scale insulin  ACHS.   Seizure disorder Continue Depakote , lacosamide , phenobarbital , and Keppra .   History of prior CVA with residual deficits Continue statin.  She is not on any antiplatelet agent?   Anxiety, schizoaffective disorder Continue risperidone ,  sertraline , and Ativan  PRN.   Chronic low back pain/sciatica Continue home gabapentin  and Flexeril  PRN.   GERD Continue PPI.   DVT prophylaxis: Lovenox  Code  Status: Full code Family Communication: Called and left message for patient's daughter. Disposition Plan: Likely discharge in the next 1 to 2 days.   Consultants:  None.  Procedures:  None.  Antimicrobials:  IV ceftriaxone  IV azithromycin    Subjective: Patient cannot give coherent history.  Objective: Vitals:   09/07/24 0500 09/07/24 0746 09/07/24 0814 09/07/24 1227  BP:  (!) 141/82  132/74  Pulse:  66  71  Resp:      Temp:  98.6 F (37 C)    TempSrc:  Oral    SpO2:  99% 98% 98%  Weight: 102.7 kg     Height:        Intake/Output Summary (Last 24 hours) at 09/07/2024 1717 Last data filed at 09/07/2024 0438 Gross per 24 hour  Intake 350 ml  Output 2650 ml  Net -2300 ml   Filed Weights   09/05/24 1759 09/07/24 0500  Weight: 118 kg 102.7 kg    Examination:  General exam: Appears calm and comfortable  Respiratory system: Adequate air entry.  Minimal expiratory wheeze. Cardiovascular system: S1 & S2 heard, RRR.  Gastrointestinal system: Abdomen is obese, soft and nontender.  Central nervous system: Awake and alert.    Data Reviewed: I have personally reviewed following labs and imaging studies  CBC: Recent Labs  Lab 09/05/24 1826 09/06/24 0408  WBC 6.4 5.2  NEUTROABS 3.4  --   HGB 15.7* 13.4  HCT 47.5* 41.1  MCV 99.6 99.8  PLT 149* 124*   Basic Metabolic Panel: Recent Labs  Lab 09/05/24 1826 09/06/24 0408  NA 139 140  K 4.5 4.2  CL 100 101  CO2 25 25  GLUCOSE 147* 273*  BUN 18 23  CREATININE 1.12* 1.10*  CALCIUM  8.9 8.2*   GFR: Estimated Creatinine Clearance: 59.5 mL/min (A) (by C-G formula based on SCr of 1.1 mg/dL (H)). Liver Function Tests: Recent Labs  Lab 09/05/24 1826 09/06/24 0408  AST 63* 47*  ALT 34 34  ALKPHOS 142* 117  BILITOT 0.3 <0.2  PROT 7.9 6.7  ALBUMIN 3.8 3.4*   No results for input(s): LIPASE, AMYLASE in the last 168 hours. No results for input(s): AMMONIA in the last 168 hours. Coagulation  Profile: Recent Labs  Lab 09/05/24 1826  INR 0.9   Cardiac Enzymes: No results for input(s): CKTOTAL, CKMB, CKMBINDEX, TROPONINI in the last 168 hours. BNP (last 3 results) Recent Labs    09/05/24 1826  PROBNP 417.0*   HbA1C: No results for input(s): HGBA1C in the last 72 hours. CBG: Recent Labs  Lab 09/06/24 1545 09/06/24 2132 09/07/24 0746 09/07/24 1227 09/07/24 1626  GLUCAP 174* 248* 226* 153* 140*   Lipid Profile: No results for input(s): CHOL, HDL, LDLCALC, TRIG, CHOLHDL, LDLDIRECT in the last 72 hours. Thyroid  Function Tests: No results for input(s): TSH, T4TOTAL, FREET4, T3FREE, THYROIDAB in the last 72 hours. Anemia Panel: No results for input(s): VITAMINB12, FOLATE, FERRITIN, TIBC, IRON, RETICCTPCT in the last 72 hours. Urine analysis:    Component Value Date/Time   COLORURINE YELLOW 08/01/2024 2137   APPEARANCEUR CLEAR 08/01/2024 2137   LABSPEC <1.005 (L) 08/01/2024 2137   PHURINE 6.5 08/01/2024 2137   GLUCOSEU NEGATIVE 08/01/2024 2137   HGBUR MODERATE (A) 08/01/2024 2137   BILIRUBINUR NEGATIVE 08/01/2024 2137   KETONESUR NEGATIVE 08/01/2024 2137   PROTEINUR NEGATIVE 08/01/2024  2137   UROBILINOGEN 1.0 06/01/2015 0104   NITRITE NEGATIVE 08/01/2024 2137   LEUKOCYTESUR TRACE (A) 08/01/2024 2137   Sepsis Labs: @LABRCNTIP (procalcitonin:4,lacticidven:4)  ) Recent Results (from the past 240 hours)  Blood Culture (routine x 2)     Status: Abnormal (Preliminary result)   Collection Time: 09/05/24  6:29 PM   Specimen: BLOOD  Result Value Ref Range Status   Specimen Description BLOOD SITE NOT SPECIFIED  Final   Special Requests   Final    BOTTLES DRAWN AEROBIC ONLY Blood Culture results may not be optimal due to an inadequate volume of blood received in culture bottles   Culture  Setup Time   Final    GRAM POSITIVE COCCI AEROBIC BOTTLE ONLY CRITICAL RESULT CALLED TO, READ BACK BY AND VERIFIED WITH: PHARMD ERIN  BILLY [id number] AT 1619 BY EC    Culture (A)  Final    STAPHYLOCOCCUS HOMINIS THE SIGNIFICANCE OF ISOLATING THIS ORGANISM FROM A SINGLE SET OF BLOOD CULTURES WHEN MULTIPLE SETS ARE DRAWN IS UNCERTAIN. PLEASE NOTIFY THE MICROBIOLOGY DEPARTMENT WITHIN ONE WEEK IF SPECIATION AND SENSITIVITIES ARE REQUIRED. Performed at [facility], [address] [place], [place] [address]    Report Status PENDING  Incomplete  Blood Culture ID Panel (Reflexed)     Status: Abnormal   Collection Time: 09/05/24  6:29 PM  Result Value Ref Range Status   Enterococcus faecalis NOT DETECTED NOT DETECTED Final   Enterococcus Faecium NOT DETECTED NOT DETECTED Final   Listeria monocytogenes NOT DETECTED NOT DETECTED Final   Staphylococcus species DETECTED (A) NOT DETECTED Final    Comment: CRITICAL RESULT CALLED TO, READ BACK BY AND VERIFIED WITH: PHARMD ROCKY BILLY [id number] AT 1619 BY EC    Staphylococcus aureus (BCID) NOT DETECTED NOT DETECTED Final   Staphylococcus epidermidis NOT DETECTED NOT DETECTED Final   Staphylococcus lugdunensis NOT DETECTED NOT DETECTED Final   Streptococcus species NOT DETECTED NOT DETECTED Final   Streptococcus agalactiae NOT DETECTED NOT DETECTED Final   Streptococcus pneumoniae NOT DETECTED NOT DETECTED Final   Streptococcus pyogenes NOT DETECTED NOT DETECTED Final   A.calcoaceticus-baumannii NOT DETECTED NOT DETECTED Final   Bacteroides fragilis NOT DETECTED NOT DETECTED Final   Enterobacterales NOT DETECTED NOT DETECTED Final   Enterobacter cloacae complex NOT DETECTED NOT DETECTED Final   Escherichia coli NOT DETECTED NOT DETECTED Final   Klebsiella aerogenes NOT DETECTED NOT DETECTED Final   Klebsiella oxytoca NOT DETECTED NOT DETECTED Final   Klebsiella pneumoniae NOT DETECTED NOT DETECTED Final   Proteus species NOT DETECTED NOT DETECTED Final   Salmonella species NOT DETECTED NOT DETECTED Final   Serratia marcescens NOT DETECTED NOT DETECTED Final    Haemophilus influenzae NOT DETECTED NOT DETECTED Final   Neisseria meningitidis NOT DETECTED NOT DETECTED Final   Pseudomonas aeruginosa NOT DETECTED NOT DETECTED Final   Stenotrophomonas maltophilia NOT DETECTED NOT DETECTED Final   Candida albicans NOT DETECTED NOT DETECTED Final   Candida auris NOT DETECTED NOT DETECTED Final   Candida glabrata NOT DETECTED NOT DETECTED Final   Candida krusei NOT DETECTED NOT DETECTED Final   Candida parapsilosis NOT DETECTED NOT DETECTED Final   Candida tropicalis NOT DETECTED NOT DETECTED Final   Cryptococcus neoformans/gattii NOT DETECTED NOT DETECTED Final    Comment: Performed at [facility], [address] [place], [place] [address]  Resp panel by RT-PCR (RSV, Flu A&B, Covid) Anterior Nasal Swab     Status: None   Collection Time: 09/05/24  8:15 PM   Specimen: Anterior Nasal Swab  Result Value Ref Range Status   SARS Coronavirus 2 by RT PCR NEGATIVE NEGATIVE Final   Influenza A by PCR NEGATIVE NEGATIVE Final   Influenza B by PCR NEGATIVE NEGATIVE Final    Comment: (NOTE) The Xpert Xpress SARS-CoV-2/FLU/RSV plus assay is intended as an aid in the diagnosis of influenza from Nasopharyngeal swab specimens and should not be used as a sole basis for treatment. Nasal washings and aspirates are unacceptable for Xpert Xpress SARS-CoV-2/FLU/RSV testing.  Fact Sheet for Patients: bloggercourse.com  Fact Sheet for Healthcare Providers: seriousbroker.it  This test is not yet approved or cleared by the [place]  FDA and has been authorized for detection and/or diagnosis of SARS-CoV-2 by FDA under an Emergency Use Authorization (EUA). This EUA will remain in effect (meaning this test can be used) for the duration of the COVID-19 declaration under Section 564(b)(1) of the Act, 21 U.S.C. section 360bbb-3(b)(1), unless the authorization is terminated or revoked.     Resp Syncytial Virus by  PCR NEGATIVE NEGATIVE Final    Comment: (NOTE) Fact Sheet for Patients: bloggercourse.com  Fact Sheet for Healthcare Providers: seriousbroker.it  This test is not yet approved or cleared by the [place]  FDA and has been authorized for detection and/or diagnosis of SARS-CoV-2 by FDA under an Emergency Use Authorization (EUA). This EUA will remain in effect (meaning this test can be used) for the duration of the COVID-19 declaration under Section 564(b)(1) of the Act, 21 U.S.C. section 360bbb-3(b)(1), unless the authorization is terminated or revoked.  Performed at [facility], [address] [place], [place] [address]   Respiratory (~20 pathogens) panel by PCR     Status: None   Collection Time: 09/06/24  1:16 AM   Specimen: Nasopharyngeal Swab; Respiratory  Result Value Ref Range Status   Adenovirus NOT DETECTED NOT DETECTED Corrected   Coronavirus 229E NOT DETECTED NOT DETECTED Corrected    Comment: (NOTE) The Coronavirus on the Respiratory Panel, DOES NOT test for the novel  Coronavirus (2019 nCoV) CORRECTED ON 01/04 AT 0816: PREVIOUSLY REPORTED AS NOT DETECTED    Coronavirus HKU1 NOT DETECTED NOT DETECTED Corrected   Coronavirus NL63 NOT DETECTED NOT DETECTED Corrected   Coronavirus OC43 NOT DETECTED NOT DETECTED Corrected   Metapneumovirus NOT DETECTED NOT DETECTED Corrected   Rhinovirus / Enterovirus NOT DETECTED NOT DETECTED Corrected   Influenza A NOT DETECTED NOT DETECTED Corrected   Influenza B NOT DETECTED NOT DETECTED Corrected   Parainfluenza Virus 1 NOT DETECTED NOT DETECTED Corrected   Parainfluenza Virus 2 NOT DETECTED NOT DETECTED Corrected   Parainfluenza Virus 3 NOT DETECTED NOT DETECTED Corrected   Parainfluenza Virus 4 NOT DETECTED NOT DETECTED Corrected   Respiratory Syncytial Virus NOT DETECTED NOT DETECTED Corrected   Bordetella pertussis NOT DETECTED NOT DETECTED Corrected   Bordetella  Parapertussis NOT DETECTED NOT DETECTED Corrected   Chlamydophila pneumoniae NOT DETECTED NOT DETECTED Corrected   Mycoplasma pneumoniae NOT DETECTED NOT DETECTED Corrected    Comment: Performed at [facility], [address], [place], [place] [address]  Blood Culture (routine x 2)     Status: None (Preliminary result)   Collection Time: 09/06/24  4:21 PM   Specimen: BLOOD  Result Value Ref Range Status   Specimen Description BLOOD BLOOD LEFT ARM  Final   Special Requests   Final    BOTTLES DRAWN AEROBIC ONLY Blood Culture results may not be optimal due to an inadequate  volume of blood received in culture bottles   Culture   Final    NO GROWTH < 12 HOURS Performed at [facility], [address], [place], [place] [address]    Report Status PENDING  Incomplete  Culture, blood (Routine X 2) w Reflex to ID Panel     Status: None (Preliminary result)   Collection Time: 09/06/24  7:02 PM   Specimen: BLOOD  Result Value Ref Range Status   Specimen Description BLOOD BLOOD LEFT HAND  Final   Special Requests   Final    BOTTLES DRAWN AEROBIC AND ANAEROBIC Blood Culture adequate volume   Culture   Final    NO GROWTH < 12 HOURS Performed at [facility], [address], [place], [place] [address]    Report Status PENDING  Incomplete  Culture, blood (Routine X 2) w Reflex to ID Panel     Status: None (Preliminary result)   Collection Time: 09/06/24  7:04 PM   Specimen: BLOOD  Result Value Ref Range Status   Specimen Description BLOOD RIGHT HAND  Final   Special Requests   Final    BOTTLES DRAWN AEROBIC AND ANAEROBIC Blood Culture adequate volume   Culture   Final    NO GROWTH < 12 HOURS Performed at [facility], [address], [place], [place] [address]    Report Status PENDING  Incomplete         Radiology Studies: ECHOCARDIOGRAM COMPLETE Result Date: 09/07/2024    ECHOCARDIOGRAM REPORT   Patient Name:   NEILANI DUFFEE Date of Exam: 09/07/2024 Medical Rec  #:  [id number]      Height:       62.0 in Accession #:    [id number]     Weight:       226.4 lb Date of Birth:  [birth year]      BSA:          2.015 m Patient Age:    63 years       BP:           132/74 mmHg Patient Gender: F              HR:           60 bpm. Exam Location:  Inpatient Procedure: 2D Echo (Both Spectral and Color Flow Doppler were utilized during            procedure). Indications:    I50.40* Unspecified combined systolic (congestive) and diastolic                 (congestive) heart failure  History:        Patient has prior history of Echocardiogram examinations, most                 recent 11/17/2019. CHF, Signs/Symptoms:Altered Mental Status;                 Risk Factors:Hypertension, Current Smoker, Dyslipidemia and                 Diabetes. Schizoprenia. HIV.  Sonographer:    Ellouise Mose RDCS Referring Phys: [id number] [facility]  Sonographer Comments: Image acquisition challenging due to uncooperative patient. Patient screamed at me that she wanted to end exam. EKG failure. IMPRESSIONS  1. Left ventricular ejection fraction, by estimation, is 55 to 60%. The left ventricle has normal function. The left ventricle has no regional wall motion abnormalities.  2. Right ventricular systolic function was  not well visualized.  3. The mitral valve was not well visualized.  4. The aortic valve is calcified. There is mild calcification of the aortic valve. FINDINGS  Left Ventricle: Left ventricular ejection fraction, by estimation, is 55 to 60%. The left ventricle has normal function. The left ventricle has no regional wall motion abnormalities. Right Ventricle: Right ventricular systolic function was not well visualized. Left Atrium: Left atrial size was not well visualized. Right Atrium: Right atrial size was not well visualized. Pericardium: The pericardium was not well visualized. Mitral Valve: The mitral valve was not well visualized. Tricuspid Valve: The tricuspid valve is not well visualized. Aortic  Valve: The aortic valve is calcified. There is mild calcification of the aortic valve. Pulmonic Valve: The pulmonic valve was not well visualized. Aorta: Aortic root could not be assessed. Venous: The inferior vena cava was not well visualized.  LEFT VENTRICLE PLAX 2D LVOT diam:     2.10 cm LVOT Area:     3.46 cm   AORTA Ao Root diam: 1.90 cm  SHUNTS Systemic Diam: 2.10 cm Oneil Parchment MD Electronically signed by Oneil Parchment MD Signature Date/Time: 09/07/2024/4:53:21 PM    Final    DG Chest Port 1 View Result Date: 09/05/2024 EXAM: 1 VIEW(S) XRAY OF THE CHEST 09/05/2024 06:36:00 PM COMPARISON: 08/01/2024 CLINICAL HISTORY: Questionable sepsis - evaluate for abnormality FINDINGS: LUNGS AND PLEURA: Diffuse interstitial prominence throughout the lungs. Bibasilar focal opacities could reflect atelectasis or infiltrates, left greater than right. No pleural effusion. No pneumothorax. HEART AND MEDIASTINUM: No acute abnormality of the cardiac and mediastinal silhouettes. BONES AND SOFT TISSUES: No acute osseous abnormality. IMPRESSION: 1. Bibasilar focal opacities, left greater than right, possibly representing atelectasis or infiltrates. 2. Diffuse interstitial prominence throughout the lungs. This could reflect edema or atypical infection. Electronically signed by: Kevin Dover MD 09/05/2024 07:13 PM EST RP Workstation: [id number]        Scheduled Meds:  abacavir -dolutegravir -lamiVUDine   1 tablet Oral q morning   amLODipine   10 mg Oral Daily   budesonide  (PULMICORT ) nebulizer solution  0.25 mg Nebulization BID   cloNIDine   0.2 mg Oral BID   divalproex   500 mg Oral TID   dolutegravir   50 mg Oral Daily   enoxaparin  (LOVENOX ) injection  50 mg Subcutaneous Q24H   gabapentin   300 mg Oral TID   guaiFENesin   600 mg Oral BID   insulin  aspart  0-5 Units Subcutaneous QHS   insulin  aspart  0-9 Units Subcutaneous TID WC   ipratropium-albuterol   3 mL Nebulization BID   lacosamide   150 mg Oral BID   levETIRAcetam    1,000 mg Oral BID   methylPREDNISolone  (SOLU-MEDROL ) injection  40 mg Intravenous BID   pantoprazole   40 mg Oral Daily   PHENobarbital   64.8 mg Oral BID   risperiDONE   2 mg Oral QHS   rosuvastatin   20 mg Oral Daily   sertraline   100 mg Oral QHS   triamterene -hydrochlorothiazide   1 tablet Oral Daily   Continuous Infusions:  azithromycin  (ZITHROMAX ) 500 mg in sodium chloride  0.9 % 250 mL IVPB 500 mg (09/06/24 2034)   cefTRIAXone  (ROCEPHIN )  IV 2 g (09/06/24 2231)     LOS: 1 day    Time spent: 55 minutes.    Leatrice Chapel, MD  [facility] 7PM-7AM contact night coverage as above    "

## 2024-09-07 NOTE — Progress Notes (Signed)
" °  Echocardiogram 2D Echocardiogram has been attempted, patient screamed at me to end exam. Unable to complete exam  Susan [name] R 09/07/2024, 2:46 PM "

## 2024-09-07 NOTE — Progress Notes (Signed)
 Patient refused lab and ECHO

## 2024-09-07 NOTE — Telephone Encounter (Signed)
 Please advise-CB

## 2024-09-07 NOTE — Telephone Encounter (Signed)
 gabapentin  (NEURONTIN ) 300 MG capsule [Pharmacy Med Name: GABAPENTIN  300MG  CAPSULES]

## 2024-09-07 NOTE — Plan of Care (Signed)
   Problem: Coping: Goal: Ability to adjust to condition or change in health will improve Outcome: Progressing

## 2024-09-08 ENCOUNTER — Other Ambulatory Visit: Payer: Self-pay | Admitting: Nurse Practitioner

## 2024-09-08 ENCOUNTER — Encounter: Payer: Self-pay | Admitting: Nurse Practitioner

## 2024-09-08 DIAGNOSIS — E119 Type 2 diabetes mellitus without complications: Secondary | ICD-10-CM

## 2024-09-08 LAB — MAGNESIUM: Magnesium: 2.4 mg/dL (ref 1.7–2.4)

## 2024-09-08 LAB — BASIC METABOLIC PANEL WITH GFR
Anion gap: 13 (ref 5–15)
BUN: 34 mg/dL — ABNORMAL HIGH (ref 8–23)
CO2: 27 mmol/L (ref 22–32)
Calcium: 8.2 mg/dL — ABNORMAL LOW (ref 8.9–10.3)
Chloride: 98 mmol/L (ref 98–111)
Creatinine, Ser: 1.45 mg/dL — ABNORMAL HIGH (ref 0.44–1.00)
GFR, Estimated: 41 mL/min — ABNORMAL LOW
Glucose, Bld: 210 mg/dL — ABNORMAL HIGH (ref 70–99)
Potassium: 4.3 mmol/L (ref 3.5–5.1)
Sodium: 138 mmol/L (ref 135–145)

## 2024-09-08 LAB — GLUCOSE, CAPILLARY
Glucose-Capillary: 231 mg/dL — ABNORMAL HIGH (ref 70–99)
Glucose-Capillary: 157 mg/dL — ABNORMAL HIGH (ref 70–99)
Glucose-Capillary: 204 mg/dL — ABNORMAL HIGH (ref 70–99)
Glucose-Capillary: 211 mg/dL — ABNORMAL HIGH (ref 70–99)
Glucose-Capillary: 286 mg/dL — ABNORMAL HIGH (ref 70–99)

## 2024-09-08 LAB — RENAL FUNCTION PANEL
Albumin: 3.2 g/dL — ABNORMAL LOW (ref 3.5–5.0)
Anion gap: 9 (ref 5–15)
BUN: 27 mg/dL — ABNORMAL HIGH (ref 8–23)
CO2: 30 mmol/L (ref 22–32)
Calcium: 8.1 mg/dL — ABNORMAL LOW (ref 8.9–10.3)
Chloride: 102 mmol/L (ref 98–111)
Creatinine, Ser: 0.98 mg/dL (ref 0.44–1.00)
GFR, Estimated: >60 mL/min
Glucose, Bld: 183 mg/dL — ABNORMAL HIGH (ref 70–99)
Phosphorus: 4.1 mg/dL (ref 2.5–4.6)
Potassium: 4.6 mmol/L (ref 3.5–5.1)
Sodium: 141 mmol/L (ref 135–145)

## 2024-09-08 LAB — CULTURE, BLOOD (ROUTINE X 2)

## 2024-09-08 MED ORDER — AZITHROMYCIN 500 MG PO TABS
500.0000 mg | ORAL_TABLET | Freq: Every day | ORAL | Status: AC
  Administered 2024-09-08 – 2024-09-09 (×2): 500 mg via ORAL
  Filled 2024-09-08 (×2): qty 1

## 2024-09-08 MED ORDER — LORAZEPAM 2 MG/ML IJ SOLN
0.5000 mg | Freq: Four times a day (QID) | INTRAMUSCULAR | Status: DC | PRN
  Administered 2024-09-08: 0.5 mg via INTRAVENOUS
  Filled 2024-09-08: qty 1

## 2024-09-08 MED ORDER — HALOPERIDOL LACTATE 5 MG/ML IJ SOLN
1.0000 mg | Freq: Four times a day (QID) | INTRAMUSCULAR | Status: DC | PRN
  Administered 2024-09-08 – 2024-09-09 (×3): 1 mg via INTRAVENOUS
  Filled 2024-09-08 (×3): qty 1

## 2024-09-08 NOTE — Progress Notes (Signed)
 PHARMACIST - PHYSICIAN COMMUNICATION DR:   Maranda CONCERNING: Antibiotic IV to Oral Route Change Policy  RECOMMENDATION: This patient is receiving Azithromycin  by the intravenous route.  Based on criteria approved by the Pharmacy and Therapeutics Committee, the antibiotic(s) is/are being converted to the equivalent oral dose form(s).   DESCRIPTION: These criteria include: Patient being treated for a respiratory tract infection, urinary tract infection, cellulitis or clostridium difficile associated diarrhea if on metronidazole  The patient is not neutropenic and does not exhibit a GI malabsorption state The patient is eating (either orally or via tube) and/or has been taking other orally administered medications for a least 24 hours The patient is improving clinically and has a Tmax < 100.5  If you have questions about this conversion, please contact the Pharmacy Department   [x]   [phone] )  Jolynn Pack  Venetia Gully, PharmD, [place], [facility] Clinical Pharmacist Phone [phone]

## 2024-09-08 NOTE — Inpatient Diabetes Management (Signed)
 Inpatient Diabetes Program Recommendations  AACE/ADA: New Consensus Statement on Inpatient Glycemic Control (2015)  Target Ranges:  Prepandial:   less than 140 mg/dL      Peak postprandial:   less than 180 mg/dL (1-2 hours)      Critically ill patients:  140 - 180 mg/dL   Lab Results  Component Value Date   GLUCAP 157 (H) 09/08/2024   HGBA1C 6.7 (A) 05/11/2024    Review of Glycemic Control  Latest Reference Range & Units 09/07/24 16:26 09/07/24 21:30 09/08/24 01:06 09/08/24 08:07  Glucose-Capillary 70 - 99 mg/dL 859 (H) 764 (H) 768 (H) 157 (H)   Diabetes history: DM 2 Outpatient Diabetes medications:  Lantus  15 units daily was stopped on 08/03/25 Metformin  500 mg bid Current orders for Inpatient glycemic control:  Novolog  0-9 units tid with meals and HS Solumedrol 40 mg IV q 12 hours Inpatient Diabetes Program Recommendations:    While on steroids, consider adding Semglee  10 units daily.   Thanks,  Randall Bullocks, RN, BC-ADM Inpatient Diabetes Coordinator Pager [phone]  (8a-5p)

## 2024-09-08 NOTE — Telephone Encounter (Signed)
 Please advise-CB

## 2024-09-08 NOTE — Progress Notes (Signed)
" °  Progress Note   Date: 09/08/2024  Patient Name: Susan [name]        MRN#: [id number]  Review the patients clinical findings supports the diagnosis of:   Obesity class III   This is of clinical significance because it can contribute to worsening of her diabetic control.  Also can make it difficult to assess her volume status.  Should talk to her outpatient providers about whether she is a candidate for GLP-1 medications for weight loss.     "

## 2024-09-08 NOTE — Progress Notes (Signed)
 Transition of Care [facility]) - Inpatient Brief Assessment   Patient Details  Name: Susan [name] MRN: [id number] Date of Birth: 01/05/1961  Transition of Care [facility]) CM/SW Contact:    Rosaline JONELLE Joe, RN Phone Number: 09/08/2024, 11:59 AM   Clinical Narrative: Patient admitted to the hospital with asthma, likely pneumonia and septicemia.  Patient lives with daughter at home.  Patient has no family in the room at this time.  DME at the home includes Rw, WC and Rol[employer].  No IP Care management needs at this time.   Transition of Care Asessment:

## 2024-09-08 NOTE — Plan of Care (Signed)

## 2024-09-08 NOTE — Progress Notes (Signed)
 " PROGRESS NOTE    Susan [name]  FMW:[id number] DOB: [birth year] DOA: 09/05/2024 PCP: Oley Bascom RAMAN, NP  Brief Narrative:  - As per H&P done on presentation: Susan [name] is a 64 y.o. female with medical history significant of HIV, hypertension, hyperlipidemia, migraine, asthma, type 2 diabetes, seizure disorder, prior CVA with residual deficits, left eye blindness, HFrecEF, marijuana abuse, GERD, anxiety, schizoaffective disorder, chronic low back pain/sciatica, tobacco abuse, GERD, thyroid  nodule.  Admitted to the hospital a month ago for multifocal pneumonia and breakthrough seizure.  Patient presents to the ED tonight via EMS for evaluation of cough, respiratory distress, and generalized weakness.  Oxygen saturation 85% on room air and was tachypneic to the high 30s.  She was wheezing and given albuterol  and Atrovent  by EMS prior to arrival.  Patient noted to be AAO x 2 and slightly confused which per family is her baseline.  In the ED, patient noted be febrile with temperature 102.9 F, tachycardic, and tachypneic on arrival.  Requiring 4 L [facility] to maintain sats in the 90s.  Labs showing no leukocytosis, hemoglobin 15.7, platelet count 149k, glucose 147, creatinine 1.12 (close to baseline), AST 63, alk phos 142, ALT and T. bili normal, blood cultures in process, lactic acid normal, proBNP 417, COVID/influenza/RSV PCR negative.  Chest x-ray showing bibasilar focal opacities, left greater than right, possibly representing atelectasis or infiltrates.  Showing diffuse interstitial prominence throughout the lungs which could reflect edema or atypical infection.  Patient was given DuoNeb, Solu-Medrol  125, acetaminophen , ceftriaxone , and azithromycin  in the ED.  TR[facility] called to admit.   Patient is currently AAO x 2.  She is not able to give much history.  Reports having cough and shortness of breath, unclear when her symptoms started.  She reports taking all of her medications at home but does not know which  medications she takes as her daughter gives them to her.  Denies fevers, chest pain, nausea, vomiting, abdominal pain, or diarrhea.   09/07/2024: Patient seen.  Patient remains a poor historian.  Called patient's daughter, Susan [name] went to voicemail.  Patient has refused echocardiogram etc.  However, patient seems to be improving.  Likely discharge in the next 1 to 2 days.  Blood culture has grown Staphylococcus hominis, likely contaminant.  Continue IV Rocephin  and azithromycin  for now.  09/08/2024: Patient states she wants to go home.  Remains on 3 L/min oxygen.  Still bronchospastic.  Unreliable historian.  Denies chest or abdominal pain.     Assessment & Plan:   Principal Problem:   Asthma exacerbation Active Problems:   Acute hypoxemic respiratory failure (HCC)   Pneumonia   Acute exacerbation of CHF (congestive heart failure) (HCC)   SIRS (systemic inflammatory response syndrome) (HCC)     SIRS: Possible asthma without acute exacerbation: Acute hypoxemic respiratory failure: Possible pneumonia: - Continue nebs DuoNeb and IV steroids. - Continue azithromycin  and Rocephin . - Patient still remains bronchospastic   Acute on chronic HFrecEF: -Repeat echo (low quality).  However, normal left ventricular EF reported. -Torsemide  20 Mg p.o. once daily. - I's and O's -4100 for the hospital stay   HIV CD4 count 418 in February 2025.  Continue Tivicay  and Triumeq .   Hypertension -Continue to monitor and optimize.   Hyperlipidemia Continue Crestor .   Type 2 diabetes Hemoglobin A1c 6.7 in September 2025.  Patient is no longer on Lantus  at home.  Placed on sensitive sliding scale insulin  ACHS.   Seizure disorder Continue Depakote , lacosamide , phenobarbital ,  and Keppra .   History of prior CVA with residual deficits Continue statin.  She is not on any antiplatelet agent?   Anxiety, schizoaffective disorder Continue risperidone , sertraline , and Ativan  PRN.   Chronic  low back pain/sciatica Continue home gabapentin  and Flexeril  PRN.   GERD Continue PPI.     DVT prophylaxis: Lovenox  Code Status: Full code Family Communication: No family at the bedside. Disposition Plan: Likely discharge in the next 1 to 2 days.   Reason for continuing need for hospitalization: Still requiring 3 L of oxygen on rounds.    Objective: Vitals:   09/07/24 2100 09/08/24 0026 09/08/24 0444 09/08/24 0500  BP:  (!) 153/79 (!) 155/60   Pulse:  67 (!) 55   Resp:      Temp:  97.8 F (36.6 C) (!) 97.5 F (36.4 C)   TempSrc:      SpO2: 98% 100% 100%   Weight:    101 kg  Height:        Intake/Output Summary (Last 24 hours) at 09/08/2024 0746 Last data filed at 09/08/2024 0010 Gross per 24 hour  Intake --  Output 800 ml  Net -800 ml   Filed Weights   09/05/24 1759 09/07/24 0500 09/08/24 0500  Weight: 118 kg 102.7 kg 101 kg    Examination:  Physical Exam Constitutional:      Appearance: She is obese.  HENT:     Head: Normocephalic and atraumatic.     Mouth/Throat:     Pharynx: Oropharynx is clear.  Eyes:     Extraocular Movements: Extraocular movements intact.     Pupils: Pupils are equal, round, and reactive to light.  Cardiovascular:     Rate and Rhythm: Normal rate and regular rhythm.     Heart sounds: No murmur heard. Pulmonary:     Breath sounds: No stridor. Rhonchi present.  Abdominal:     Palpations: Abdomen is soft.     Tenderness: There is no abdominal tenderness. There is no guarding or rebound.  Musculoskeletal:     Cervical back: Neck supple.     Right lower leg: Edema present.     Left lower leg: Edema present.  Skin:    General: Skin is warm and dry.  Neurological:     General: No focal deficit present.     Mental Status: She is alert.  Psychiatric:        Mood and Affect: Mood normal.     Data Reviewed: I have personally reviewed following labs and imaging studies  CBC: Recent Labs  Lab 09/05/24 1826 09/06/24 0408  WBC 6.4  5.2  NEUTROABS 3.4  --   HGB 15.7* 13.4  HCT 47.5* 41.1  MCV 99.6 99.8  PLT 149* 124*   Basic Metabolic Panel: Recent Labs  Lab 09/05/24 1826 09/06/24 0408 09/08/24 0511  NA 139 140 141  K 4.5 4.2 4.6  CL 100 101 102  CO2 25 25 30   GLUCOSE 147* 273* 183*  BUN 18 23 27*  CREATININE 1.12* 1.10* 0.98  CALCIUM  8.9 8.2* 8.1*  MG  --   --  2.4  PHOS  --   --  4.1   GFR: Estimated Creatinine Clearance: 66.2 mL/min (by C-G formula based on SCr of 0.98 mg/dL). Liver Function Tests: Recent Labs  Lab 09/05/24 1826 09/06/24 0408 09/08/24 0511  AST 63* 47*  --   ALT 34 34  --   ALKPHOS 142* 117  --   BILITOT 0.3 <0.2  --  PROT 7.9 6.7  --   ALBUMIN 3.8 3.4* 3.2*   No results for input(s): LIPASE, AMYLASE in the last 168 hours. No results for input(s): AMMONIA in the last 168 hours. Coagulation Profile: Recent Labs  Lab 09/05/24 1826  INR 0.9   Cardiac Enzymes: No results for input(s): CKTOTAL, CKMB, CKMBINDEX, TROPONINI in the last 168 hours. ProBNP, BNP (last 5 results) Recent Labs    09/05/24 1826  PROBNP 417.0*   HbA1C: No results for input(s): HGBA1C in the last 72 hours. CBG: Recent Labs  Lab 09/07/24 0746 09/07/24 1227 09/07/24 1626 09/07/24 2130 09/08/24 0106  GLUCAP 226* 153* 140* 235* 231*   Lipid Profile: No results for input(s): CHOL, HDL, LDLCALC, TRIG, CHOLHDL, LDLDIRECT in the last 72 hours. Thyroid  Function Tests: No results for input(s): TSH, T4TOTAL, FREET4, T3FREE, THYROIDAB in the last 72 hours. Anemia Panel: No results for input(s): VITAMINB12, FOLATE, FERRITIN, TIBC, IRON, RETICCTPCT in the last 72 hours. Sepsis Labs: Recent Labs  Lab 09/05/24 1826 09/06/24 0408  PROCALCITON  --  <0.10  LATICACIDVEN 1.8  --     Recent Results (from the past 240 hours)  Blood Culture (routine x 2)     Status: Abnormal (Preliminary result)   Collection Time: 09/05/24  6:29 PM   Specimen: BLOOD   Result Value Ref Range Status   Specimen Description BLOOD SITE NOT SPECIFIED  Final   Special Requests   Final    BOTTLES DRAWN AEROBIC ONLY Blood Culture results may not be optimal due to an inadequate volume of blood received in culture bottles   Culture  Setup Time   Final    GRAM POSITIVE COCCI AEROBIC BOTTLE ONLY CRITICAL RESULT CALLED TO, READ BACK BY AND VERIFIED WITH: PHARMD ERIN BILLY [id number] AT 1619 BY EC    Culture (A)  Final    STAPHYLOCOCCUS HOMINIS THE SIGNIFICANCE OF ISOLATING THIS ORGANISM FROM A SINGLE SET OF BLOOD CULTURES WHEN MULTIPLE SETS ARE DRAWN IS UNCERTAIN. PLEASE NOTIFY THE MICROBIOLOGY DEPARTMENT WITHIN ONE WEEK IF SPECIATION AND SENSITIVITIES ARE REQUIRED. Performed at [facility], [address] [place], [place] [address]    Report Status PENDING  Incomplete  Blood Culture ID Panel (Reflexed)     Status: Abnormal   Collection Time: 09/05/24  6:29 PM  Result Value Ref Range Status   Enterococcus faecalis NOT DETECTED NOT DETECTED Final   Enterococcus Faecium NOT DETECTED NOT DETECTED Final   Listeria monocytogenes NOT DETECTED NOT DETECTED Final   Staphylococcus species DETECTED (A) NOT DETECTED Final    Comment: CRITICAL RESULT CALLED TO, READ BACK BY AND VERIFIED WITH: PHARMD ERIN WILLIAMSON [id number] AT 1619 BY EC    Staphylococcus aureus (BCID) NOT DETECTED NOT DETECTED Final   Staphylococcus epidermidis NOT DETECTED NOT DETECTED Final   Staphylococcus lugdunensis NOT DETECTED NOT DETECTED Final   Streptococcus species NOT DETECTED NOT DETECTED Final   Streptococcus agalactiae NOT DETECTED NOT DETECTED Final   Streptococcus pneumoniae NOT DETECTED NOT DETECTED Final   Streptococcus pyogenes NOT DETECTED NOT DETECTED Final   A.calcoaceticus-baumannii NOT DETECTED NOT DETECTED Final   Bacteroides fragilis NOT DETECTED NOT DETECTED Final   Enterobacterales NOT DETECTED NOT DETECTED Final   Enterobacter cloacae complex NOT DETECTED NOT  DETECTED Final   Escherichia coli NOT DETECTED NOT DETECTED Final   Klebsiella aerogenes NOT DETECTED NOT DETECTED Final   Klebsiella oxytoca NOT DETECTED NOT DETECTED Final   Klebsiella pneumoniae NOT DETECTED NOT DETECTED Final   Proteus species  NOT DETECTED NOT DETECTED Final   Salmonella species NOT DETECTED NOT DETECTED Final   Serratia marcescens NOT DETECTED NOT DETECTED Final   Haemophilus influenzae NOT DETECTED NOT DETECTED Final   Neisseria meningitidis NOT DETECTED NOT DETECTED Final   Pseudomonas aeruginosa NOT DETECTED NOT DETECTED Final   Stenotrophomonas maltophilia NOT DETECTED NOT DETECTED Final   Candida albicans NOT DETECTED NOT DETECTED Final   Candida auris NOT DETECTED NOT DETECTED Final   Candida glabrata NOT DETECTED NOT DETECTED Final   Candida krusei NOT DETECTED NOT DETECTED Final   Candida parapsilosis NOT DETECTED NOT DETECTED Final   Candida tropicalis NOT DETECTED NOT DETECTED Final   Cryptococcus neoformans/gattii NOT DETECTED NOT DETECTED Final    Comment: Performed at [facility], [address], [place], [place] [address]  Resp panel by RT-PCR (RSV, Flu A&B, Covid) Anterior Nasal Swab     Status: None   Collection Time: 09/05/24  8:15 PM   Specimen: Anterior Nasal Swab  Result Value Ref Range Status   SARS Coronavirus 2 by RT PCR NEGATIVE NEGATIVE Final   Influenza A by PCR NEGATIVE NEGATIVE Final   Influenza B by PCR NEGATIVE NEGATIVE Final    Comment: (NOTE) The Xpert Xpress SARS-CoV-2/FLU/RSV plus assay is intended as an aid in the diagnosis of influenza from Nasopharyngeal swab specimens and should not be used as a sole basis for treatment. Nasal washings and aspirates are unacceptable for Xpert Xpress SARS-CoV-2/FLU/RSV testing.  Fact Sheet for Patients: bloggercourse.com  Fact Sheet for Healthcare Providers: seriousbroker.it  This test is not yet approved or cleared by the [place]  [place] FDA and has been authorized for detection and/or diagnosis of SARS-CoV-2 by FDA under an Emergency Use Authorization (EUA). This EUA will remain in effect (meaning this test can be used) for the duration of the COVID-19 declaration under Section 564(b)(1) of the Act, 21 U.S.C. section 360bbb-3(b)(1), unless the authorization is terminated or revoked.     Resp Syncytial Virus by PCR NEGATIVE NEGATIVE Final    Comment: (NOTE) Fact Sheet for Patients: bloggercourse.com  Fact Sheet for Healthcare Providers: seriousbroker.it  This test is not yet approved or cleared by the [place]  FDA and has been authorized for detection and/or diagnosis of SARS-CoV-2 by FDA under an Emergency Use Authorization (EUA). This EUA will remain in effect (meaning this test can be used) for the duration of the COVID-19 declaration under Section 564(b)(1) of the Act, 21 U.S.C. section 360bbb-3(b)(1), unless the authorization is terminated or revoked.  Performed at [facility], [address] [place], [place] [address]   Respiratory (~20 pathogens) panel by PCR     Status: None   Collection Time: 09/06/24  1:16 AM   Specimen: Nasopharyngeal Swab; Respiratory  Result Value Ref Range Status   Adenovirus NOT DETECTED NOT DETECTED Corrected   Coronavirus 229E NOT DETECTED NOT DETECTED Corrected    Comment: (NOTE) The Coronavirus on the Respiratory Panel, DOES NOT test for the novel  Coronavirus (2019 nCoV) CORRECTED ON 01/04 AT 0816: PREVIOUSLY REPORTED AS NOT DETECTED    Coronavirus HKU1 NOT DETECTED NOT DETECTED Corrected   Coronavirus NL63 NOT DETECTED NOT DETECTED Corrected   Coronavirus OC43 NOT DETECTED NOT DETECTED Corrected   Metapneumovirus NOT DETECTED NOT DETECTED Corrected   Rhinovirus / Enterovirus NOT DETECTED NOT DETECTED Corrected   Influenza A NOT DETECTED NOT DETECTED Corrected   Influenza B NOT DETECTED NOT DETECTED  Corrected   Parainfluenza Virus 1 NOT DETECTED  NOT DETECTED Corrected   Parainfluenza Virus 2 NOT DETECTED NOT DETECTED Corrected   Parainfluenza Virus 3 NOT DETECTED NOT DETECTED Corrected   Parainfluenza Virus 4 NOT DETECTED NOT DETECTED Corrected   Respiratory Syncytial Virus NOT DETECTED NOT DETECTED Corrected   Bordetella pertussis NOT DETECTED NOT DETECTED Corrected   Bordetella Parapertussis NOT DETECTED NOT DETECTED Corrected   Chlamydophila pneumoniae NOT DETECTED NOT DETECTED Corrected   Mycoplasma pneumoniae NOT DETECTED NOT DETECTED Corrected    Comment: Performed at [facility], [address] [place], [place] [address]  Blood Culture (routine x 2)     Status: None (Preliminary result)   Collection Time: 09/06/24  4:21 PM   Specimen: BLOOD  Result Value Ref Range Status   Specimen Description BLOOD BLOOD LEFT ARM  Final   Special Requests   Final    BOTTLES DRAWN AEROBIC ONLY Blood Culture results may not be optimal due to an inadequate volume of blood received in culture bottles   Culture   Final    NO GROWTH < 12 HOURS Performed at [facility], [address] [place], [place] [address]    Report Status PENDING  Incomplete  Culture, blood (Routine X 2) w Reflex to ID Panel     Status: None (Preliminary result)   Collection Time: 09/06/24  7:02 PM   Specimen: BLOOD  Result Value Ref Range Status   Specimen Description BLOOD BLOOD LEFT HAND  Final   Special Requests   Final    BOTTLES DRAWN AEROBIC AND ANAEROBIC Blood Culture adequate volume   Culture   Final    NO GROWTH < 12 HOURS Performed at [facility], [address], [place], [place] [address]    Report Status PENDING  Incomplete  Culture, blood (Routine X 2) w Reflex to ID Panel     Status: None (Preliminary result)   Collection Time: 09/06/24  7:04 PM   Specimen: BLOOD  Result Value Ref Range Status   Specimen Description BLOOD RIGHT HAND  Final   Special Requests   Final    BOTTLES  DRAWN AEROBIC AND ANAEROBIC Blood Culture adequate volume   Culture   Final    NO GROWTH < 12 HOURS Performed at [facility], [address] [place], [place] [address]    Report Status PENDING  Incomplete     Radiology Studies: ECHOCARDIOGRAM COMPLETE Result Date: 09/07/2024    ECHOCARDIOGRAM REPORT   Patient Name:   Susan [name] Date of Exam: 09/07/2024 Medical Rec #:  [id number]      Height:       62.0 in Accession #:    [id number]     Weight:       226.4 lb Date of Birth:  Apr 09, 1961      BSA:          2.015 m Patient Age:    63 years       BP:           132/74 mmHg Patient Gender: F              HR:           60 bpm. Exam Location:  Inpatient Procedure: 2D Echo (Both Spectral and Color Flow Doppler were utilized during            procedure). Indications:    I50.40* Unspecified combined systolic (congestive) and diastolic                 (  congestive) heart failure  History:        Patient has prior history of Echocardiogram examinations, most                 recent 11/17/2019. CHF, Signs/Symptoms:Altered Mental Status;                 Risk Factors:Hypertension, Current Smoker, Dyslipidemia and                 Diabetes. Schizoprenia. HIV.  Sonographer:    Ellouise Mose RDCS Referring Phys: [id number] [facility]  Sonographer Comments: Image acquisition challenging due to uncooperative patient. Patient screamed at me that she wanted to end exam. EKG failure. IMPRESSIONS  1. Left ventricular ejection fraction, by estimation, is 55 to 60%. The left ventricle has normal function. The left ventricle has no regional wall motion abnormalities.  2. Right ventricular systolic function was not well visualized.  3. The mitral valve was not well visualized.  4. The aortic valve is calcified. There is mild calcification of the aortic valve. FINDINGS  Left Ventricle: Left ventricular ejection fraction, by estimation, is 55 to 60%. The left ventricle has normal function. The left ventricle has no regional wall  motion abnormalities. Right Ventricle: Right ventricular systolic function was not well visualized. Left Atrium: Left atrial size was not well visualized. Right Atrium: Right atrial size was not well visualized. Pericardium: The pericardium was not well visualized. Mitral Valve: The mitral valve was not well visualized. Tricuspid Valve: The tricuspid valve is not well visualized. Aortic Valve: The aortic valve is calcified. There is mild calcification of the aortic valve. Pulmonic Valve: The pulmonic valve was not well visualized. Aorta: Aortic root could not be assessed. Venous: The inferior vena cava was not well visualized.  LEFT VENTRICLE PLAX 2D LVOT diam:     2.10 cm LVOT Area:     3.46 cm   AORTA Ao Root diam: 1.90 cm  SHUNTS Systemic Diam: 2.10 cm Oneil Parchment MD Electronically signed by Oneil Parchment MD Signature Date/Time: 09/07/2024/4:53:21 PM    Final     Scheduled Meds:  abacavir -dolutegravir -lamiVUDine   1 tablet Oral q morning   amLODipine   10 mg Oral Daily   budesonide  (PULMICORT ) nebulizer solution  0.25 mg Nebulization BID   cloNIDine   0.2 mg Oral BID   divalproex   500 mg Oral TID   dolutegravir   50 mg Oral Daily   enoxaparin  (LOVENOX ) injection  50 mg Subcutaneous Q24H   gabapentin   300 mg Oral TID   guaiFENesin   600 mg Oral BID   insulin  aspart  0-5 Units Subcutaneous QHS   insulin  aspart  0-9 Units Subcutaneous TID WC   ipratropium-albuterol   3 mL Nebulization BID   lacosamide   150 mg Oral BID   levETIRAcetam   1,000 mg Oral BID   methylPREDNISolone  (SOLU-MEDROL ) injection  40 mg Intravenous BID   pantoprazole   40 mg Oral Daily   PHENobarbital   64.8 mg Oral BID   risperiDONE   2 mg Oral QHS   rosuvastatin   20 mg Oral Daily   sertraline   100 mg Oral QHS   torsemide   20 mg Oral Daily   triamterene -hydrochlorothiazide   1 tablet Oral Daily   Continuous Infusions:  azithromycin  (ZITHROMAX ) 500 mg in sodium chloride  0.9 % 250 mL IVPB 500 mg (09/07/24 2141)   cefTRIAXone   (ROCEPHIN )  IV 2 g (09/07/24 2045)     LOS: 2 days   Time spent: 40 minutes  Lonni KANDICE Moose, MD  [facility]  09/08/2024, 7:46 AM   "

## 2024-09-08 NOTE — Progress Notes (Signed)
 Heart Failure Navigator Progress Note  Assessed for Heart & Vascular TOC clinic readiness.  Patient does not meet criteria due to EF 55-60%, has a scheduled [facility] appointment on 09/25/2024. No HF TOC. .   Navigator will sign off at this time.   Stephane Haddock, BSN, Scientist, Clinical (histocompatibility And Immunogenetics) Only

## 2024-09-08 NOTE — Plan of Care (Signed)

## 2024-09-09 LAB — GLUCOSE, CAPILLARY
Glucose-Capillary: 193 mg/dL — ABNORMAL HIGH (ref 70–99)
Glucose-Capillary: 264 mg/dL — ABNORMAL HIGH (ref 70–99)
Glucose-Capillary: 193 mg/dL — ABNORMAL HIGH (ref 70–99)
Glucose-Capillary: 326 mg/dL — ABNORMAL HIGH (ref 70–99)

## 2024-09-09 NOTE — Plan of Care (Signed)

## 2024-09-09 NOTE — Care Management Important Message (Signed)
 Important Message  Patient Details  Name: Susan [name] MRN: [id number] Date of Birth: February 13, 1961   Important Message Given:  Yes - Medicare IM     Claretta Deed 09/09/2024, 2:22 PM

## 2024-09-09 NOTE — Plan of Care (Signed)

## 2024-09-09 NOTE — TOC Progression Note (Signed)
 Transition of Care [facility]) - Progression Note    Patient Details  Name: Susan [name] MRN: [id number] Date of Birth: [birth year]  Transition of Care [facility]) CM/SW Contact  Rosaline JONELLE Joe, RN Phone Number: 09/09/2024, 4:01 PM  Clinical Narrative:    CM called and spoke with the patient's daughter by phone and she states that the patient lives with her at the home and will return home when stable.  Patient has DME at the home that includes WC, Rolator, RW, Cane and shower chair.  Patient is normally on RA at home.  Daughter states that patient has nebulizer medications at the home but no nebulizer machine and will need one.  Patient's daughter was provided Medicare choice regarding DME company and daughter did not have a preference.  [employer] was called to deliver Nebulizer machine to the hospital room.  Patient is on RA at home and sleeps in a regular bed.  Patient is active with [facility] fo PT.  HH order for PT placed to be co-signed by MD.  Patient has [employer]iver [employer] at the home 3 hrs per day 7 days a week.  Company is unknown by the daughter.  Patient's daughter states that she is recovering from influenza herself and just got out of the hospital today and is on Z-pack but agrees to have patient return home when she is ready to discharge in the next 1-2 days.  Patient will need PTAR to home when stable.  MD is aware.                     Expected Discharge Plan and Services                                               Social Drivers of Health (SDOH) Interventions SDOH Screenings   Food Insecurity: Unknown (09/07/2024)  Housing: Low Risk (09/07/2024)  Transportation Needs: No Transportation Needs (09/07/2024)  Utilities: Not At Risk (09/07/2024)  Depression (PHQ2-9): Low Risk (01/25/2023)  Social Connections: Patient Unable To Answer (08/02/2024)  Tobacco Use: High Risk (08/02/2024)    Readmission Risk Interventions    09/09/2024    4:01 PM 09/08/2024    11:58 AM  Readmission Risk Prevention Plan  Transportation Screening Complete Complete  Medication Review Oceanographer) Complete Complete  PCP or Specialist appointment within 3-5 days of discharge Complete Complete  HRI or Home Care Consult Complete Complete  SW Recovery Care/Counseling Consult Complete Complete  Palliative Care Screening Not Applicable Not Applicable  Skilled Nursing Facility Not Applicable Complete

## 2024-09-09 NOTE — Progress Notes (Signed)
 SATURATION QUALIFICATIONS: (This note is used to comply with regulatory documentation for home oxygen)  Patient Saturations on Room Air at Rest = 97%

## 2024-09-09 NOTE — Progress Notes (Signed)
 " PROGRESS NOTE    Susan [name]  FMW:[id number] DOB: June 30, 1961 DOA: 09/05/2024 PCP: Oley Bascom RAMAN, NP  Brief Narrative:  - As per H&P done on presentation: Susan [name] is a 64 y.o. female with medical history significant of HIV, hypertension, hyperlipidemia, migraine, asthma, type 2 diabetes, seizure disorder, prior CVA with residual deficits, left eye blindness, HFrecEF, marijuana abuse, GERD, anxiety, schizoaffective disorder, chronic low back pain/sciatica, tobacco abuse, GERD, thyroid  nodule.  Admitted to the hospital a month ago for multifocal pneumonia and breakthrough seizure.  Patient presents to the ED tonight via EMS for evaluation of cough, respiratory distress, and generalized weakness.  Oxygen saturation 85% on room air and was tachypneic to the high 30s.  She was wheezing and given albuterol  and Atrovent  by EMS prior to arrival.  Patient noted to be AAO x 2 and slightly confused which per family is her baseline.  In the ED, patient noted be febrile with temperature 102.9 F, tachycardic, and tachypneic on arrival.  Requiring 4 L [place] to maintain sats in the 90s.  Labs showing no leukocytosis, hemoglobin 15.7, platelet count 149k, glucose 147, creatinine 1.12 (close to baseline), AST 63, alk phos 142, ALT and T. bili normal, blood cultures in process, lactic acid normal, proBNP 417, COVID/influenza/RSV PCR negative.  Chest x-ray showing bibasilar focal opacities, left greater than right, possibly representing atelectasis or infiltrates.  Showing diffuse interstitial prominence throughout the lungs which could reflect edema or atypical infection.  Patient was given DuoNeb, Solu-Medrol  125, acetaminophen , ceftriaxone , and azithromycin  in the ED.  [facility] called to admit.   Patient is currently AAO x 2.  She is not able to give much history.  Reports having cough and shortness of breath, unclear when her symptoms started.  She reports taking all of her medications at home but does not know which  medications she takes as her daughter gives them to her.  Denies fevers, chest pain, nausea, vomiting, abdominal pain, or diarrhea.   09/07/2024: Patient seen.  Patient remains a poor historian.  Called patient's daughter, Susan [name] went to voicemail.  Patient has refused echocardiogram etc.  However, patient seems to be improving.  Likely discharge in the next 1 to 2 days.  Blood culture has grown Staphylococcus hominis, likely contaminant.  Continue IV Rocephin  and azithromycin  for now.  09/08/2024: Patient states she wants to go home.  Remains on 3 L/min oxygen.  Still bronchospastic.  Unreliable historian.  Denies chest or abdominal pain.  09/09/2024: Patient crying out this morning she wants to go home.  Lungs sound clear today.  Still remains on 3 L/min of oxygen.  I discussed with the nurse and asked her to try to wean her off oxygen.  No family at the bedside.  Is -5 L since admit.  Holding torsemide  and triamterene /HCTZ.  She is refusing blood draw.     Assessment & Plan:   Principal Problem:   Asthma exacerbation Active Problems:   Acute hypoxemic respiratory failure (HCC)   Pneumonia   Acute exacerbation of CHF (congestive heart failure) (HCC)   SIRS (systemic inflammatory response syndrome) (HCC)     SIRS: Possible asthma without acute exacerbation: Acute hypoxemic respiratory failure: Possible pneumonia: - Continue nebs DuoNeb and IV steroids. - Continue azithromycin  and Rocephin . - Patient still remains bronchospastic   Acute on chronic HFrecEF: -Repeat echo (low quality).  However, normal left ventricular EF reported. -Torsemide  20 Mg p.o. once daily. - I's and O's - 5071 for the  hospital stay - hold torsemide  this am -recheck BMP as creat bumped from 0.98 -> 1.45  ( AKI)  AKI - hold torsemide  and triamterene  -recheck BMP this am   HIV CD4 count 418 in February 2025.  Continue Tivicay  and Triumeq .   Hypertension -Continue to monitor and optimize.    Hyperlipidemia Continue Crestor .   Type 2 diabetes Hemoglobin A1c 6.7 in September 2025.  Patient is no longer on Lantus  at home.  Placed on sensitive sliding scale insulin  ACHS.   Seizure disorder Continue Depakote , lacosamide , phenobarbital , and Keppra .   History of prior CVA with residual deficits Continue statin.  She is not on any antiplatelet agent?   Anxiety, schizoaffective disorder Continue risperidone , sertraline , and Ativan  PRN.   Chronic low back pain/sciatica Continue home gabapentin  and Flexeril  PRN.   GERD Continue PPI.     DVT prophylaxis: Lovenox  Code Status: Full code Family Communication: No family at the bedside. Disposition Plan: Likely discharge in the next 1 to 2 days.   Reason for continuing need for hospitalization: Still requiring 3 L of oxygen on rounds.    Objective: Vitals:   09/08/24 2045 09/09/24 0500 09/09/24 0717 09/09/24 0805  BP:   (!) 140/66   Pulse:   62   Resp:   17   Temp:   98.1 F (36.7 C)   TempSrc:   Oral   SpO2: 93%  94% 93%  Weight:  98 kg    Height:        Intake/Output Summary (Last 24 hours) at 09/09/2024 0816 Last data filed at 09/09/2024 0719 Gross per 24 hour  Intake 354 ml  Output 2425 ml  Net -2071 ml   Filed Weights   09/07/24 0500 09/08/24 0500 09/09/24 0500  Weight: 102.7 kg 101 kg 98 kg    Examination:  Physical Exam Constitutional:      Appearance: She is obese.  HENT:     Head: Normocephalic and atraumatic.     Mouth/Throat:     Pharynx: Oropharynx is clear.  Eyes:     Extraocular Movements: Extraocular movements intact.     Pupils: Pupils are equal, round, and reactive to light.  Cardiovascular:     Rate and Rhythm: Normal rate and regular rhythm.     Heart sounds: No murmur heard. Pulmonary:     Breath sounds: Normal breath sounds. No stridor. No wheezing, rhonchi or rales.  Abdominal:     Palpations: Abdomen is soft.     Tenderness: There is no abdominal tenderness. There is no  guarding or rebound.  Musculoskeletal:     Cervical back: Neck supple.     Right lower leg: Edema present.     Left lower leg: Edema present.  Skin:    General: Skin is warm and dry.  Neurological:     General: No focal deficit present.     Mental Status: She is alert.  Psychiatric:        Mood and Affect: Mood normal.     Data Reviewed: I have personally reviewed following labs and imaging studies  CBC: Recent Labs  Lab 09/05/24 1826 09/06/24 0408  WBC 6.4 5.2  NEUTROABS 3.4  --   HGB 15.7* 13.4  HCT 47.5* 41.1  MCV 99.6 99.8  PLT 149* 124*   Basic Metabolic Panel: Recent Labs  Lab 09/05/24 1826 09/06/24 0408 09/08/24 0511 09/08/24 1811  NA 139 140 141 138  K 4.5 4.2 4.6 4.3  CL 100 101 102 98  CO2 25 25 30 27   GLUCOSE 147* 273* 183* 210*  BUN 18 23 27* 34*  CREATININE 1.12* 1.10* 0.98 1.45*  CALCIUM  8.9 8.2* 8.1* 8.2*  MG  --   --  2.4  --   PHOS  --   --  4.1  --    GFR: Estimated Creatinine Clearance: 44 mL/min (A) (by C-G formula based on SCr of 1.45 mg/dL (H)). Liver Function Tests: Recent Labs  Lab 09/05/24 1826 09/06/24 0408 09/08/24 0511  AST 63* 47*  --   ALT 34 34  --   ALKPHOS 142* 117  --   BILITOT 0.3 <0.2  --   PROT 7.9 6.7  --   ALBUMIN 3.8 3.4* 3.2*   No results for input(s): LIPASE, AMYLASE in the last 168 hours. No results for input(s): AMMONIA in the last 168 hours. Coagulation Profile: Recent Labs  Lab 09/05/24 1826  INR 0.9   Cardiac Enzymes: No results for input(s): CKTOTAL, CKMB, CKMBINDEX, TROPONINI in the last 168 hours. ProBNP, BNP (last 5 results) Recent Labs    09/05/24 1826  PROBNP 417.0*   HbA1C: No results for input(s): HGBA1C in the last 72 hours. CBG: Recent Labs  Lab 09/08/24 0807 09/08/24 1154 09/08/24 1609 09/08/24 2216 09/09/24 0719  GLUCAP 157* 204* 211* 286* 193*   Lipid Profile: No results for input(s): CHOL, HDL, LDLCALC, TRIG, CHOLHDL, LDLDIRECT in the last  72 hours. Thyroid  Function Tests: No results for input(s): TSH, T4TOTAL, FREET4, T3FREE, THYROIDAB in the last 72 hours. Anemia Panel: No results for input(s): VITAMINB12, FOLATE, FERRITIN, TIBC, IRON, RETICCTPCT in the last 72 hours. Sepsis Labs: Recent Labs  Lab 09/05/24 1826 09/06/24 0408  PROCALCITON  --  <0.10  LATICACIDVEN 1.8  --     Recent Results (from the past 240 hours)  Blood Culture (routine x 2)     Status: Abnormal   Collection Time: 09/05/24  6:29 PM   Specimen: BLOOD  Result Value Ref Range Status   Specimen Description BLOOD SITE NOT SPECIFIED  Final   Special Requests   Final    BOTTLES DRAWN AEROBIC ONLY Blood Culture results may not be optimal due to an inadequate volume of blood received in culture bottles   Culture  Setup Time   Final    GRAM POSITIVE COCCI AEROBIC BOTTLE ONLY CRITICAL RESULT CALLED TO, READ BACK BY AND VERIFIED WITH: PHARMD ERIN BILLY [id number] AT 1619 BY EC    Culture (A)  Final    STAPHYLOCOCCUS HOMINIS THE SIGNIFICANCE OF ISOLATING THIS ORGANISM FROM A SINGLE SET OF BLOOD CULTURES WHEN MULTIPLE SETS ARE DRAWN IS UNCERTAIN. PLEASE NOTIFY THE MICROBIOLOGY DEPARTMENT WITHIN ONE WEEK IF SPECIATION AND SENSITIVITIES ARE REQUIRED. Performed at [facility], [address], [place], [place] [address]    Report Status 09/08/2024 FINAL  Final  Blood Culture ID Panel (Reflexed)     Status: Abnormal   Collection Time: 09/05/24  6:29 PM  Result Value Ref Range Status   Enterococcus faecalis NOT DETECTED NOT DETECTED Final   Enterococcus Faecium NOT DETECTED NOT DETECTED Final   Listeria monocytogenes NOT DETECTED NOT DETECTED Final   Staphylococcus species DETECTED (A) NOT DETECTED Final    Comment: CRITICAL RESULT CALLED TO, READ BACK BY AND VERIFIED WITH: PHARMD ERIN WILLIAMSON [id number] AT 1619 BY EC    Staphylococcus aureus (BCID) NOT DETECTED NOT DETECTED Final   Staphylococcus epidermidis NOT DETECTED NOT  DETECTED Final   Staphylococcus lugdunensis NOT DETECTED NOT  DETECTED Final   Streptococcus species NOT DETECTED NOT DETECTED Final   Streptococcus agalactiae NOT DETECTED NOT DETECTED Final   Streptococcus pneumoniae NOT DETECTED NOT DETECTED Final   Streptococcus pyogenes NOT DETECTED NOT DETECTED Final   A.calcoaceticus-baumannii NOT DETECTED NOT DETECTED Final   Bacteroides fragilis NOT DETECTED NOT DETECTED Final   Enterobacterales NOT DETECTED NOT DETECTED Final   Enterobacter cloacae complex NOT DETECTED NOT DETECTED Final   Escherichia coli NOT DETECTED NOT DETECTED Final   Klebsiella aerogenes NOT DETECTED NOT DETECTED Final   Klebsiella oxytoca NOT DETECTED NOT DETECTED Final   Klebsiella pneumoniae NOT DETECTED NOT DETECTED Final   Proteus species NOT DETECTED NOT DETECTED Final   Salmonella species NOT DETECTED NOT DETECTED Final   Serratia marcescens NOT DETECTED NOT DETECTED Final   Haemophilus influenzae NOT DETECTED NOT DETECTED Final   Neisseria meningitidis NOT DETECTED NOT DETECTED Final   Pseudomonas aeruginosa NOT DETECTED NOT DETECTED Final   Stenotrophomonas maltophilia NOT DETECTED NOT DETECTED Final   Candida albicans NOT DETECTED NOT DETECTED Final   Candida auris NOT DETECTED NOT DETECTED Final   Candida glabrata NOT DETECTED NOT DETECTED Final   Candida krusei NOT DETECTED NOT DETECTED Final   Candida parapsilosis NOT DETECTED NOT DETECTED Final   Candida tropicalis NOT DETECTED NOT DETECTED Final   Cryptococcus neoformans/gattii NOT DETECTED NOT DETECTED Final    Comment: Performed at [facility], [address] [place], [place] [address]  Resp panel by RT-PCR (RSV, Flu A&B, Covid) Anterior Nasal Swab     Status: None   Collection Time: 09/05/24  8:15 PM   Specimen: Anterior Nasal Swab  Result Value Ref Range Status   SARS Coronavirus 2 by RT PCR NEGATIVE NEGATIVE Final   Influenza A by PCR NEGATIVE NEGATIVE Final   Influenza B by PCR NEGATIVE  NEGATIVE Final    Comment: (NOTE) The Xpert Xpress SARS-CoV-2/FLU/RSV plus assay is intended as an aid in the diagnosis of influenza from Nasopharyngeal swab specimens and should not be used as a sole basis for treatment. Nasal washings and aspirates are unacceptable for Xpert Xpress SARS-CoV-2/FLU/RSV testing.  Fact Sheet for Patients: bloggercourse.com  Fact Sheet for Healthcare Providers: seriousbroker.it  This test is not yet approved or cleared by the [place]  FDA and has been authorized for detection and/or diagnosis of SARS-CoV-2 by FDA under an Emergency Use Authorization (EUA). This EUA will remain in effect (meaning this test can be used) for the duration of the COVID-19 declaration under Section 564(b)(1) of the Act, 21 U.S.C. section 360bbb-3(b)(1), unless the authorization is terminated or revoked.     Resp Syncytial Virus by PCR NEGATIVE NEGATIVE Final    Comment: (NOTE) Fact Sheet for Patients: bloggercourse.com  Fact Sheet for Healthcare Providers: seriousbroker.it  This test is not yet approved or cleared by the [place]  FDA and has been authorized for detection and/or diagnosis of SARS-CoV-2 by FDA under an Emergency Use Authorization (EUA). This EUA will remain in effect (meaning this test can be used) for the duration of the COVID-19 declaration under Section 564(b)(1) of the Act, 21 U.S.C. section 360bbb-3(b)(1), unless the authorization is terminated or revoked.  Performed at [facility], [address] [place], [place] [address]   Respiratory (~20 pathogens) panel by PCR     Status: None   Collection Time: 09/06/24  1:16 AM   Specimen: Nasopharyngeal Swab; Respiratory  Result Value Ref Range Status   Adenovirus NOT DETECTED NOT DETECTED  Corrected   Coronavirus 229E NOT DETECTED NOT DETECTED Corrected    Comment: (NOTE) The  Coronavirus on the Respiratory Panel, DOES NOT test for the novel  Coronavirus (2019 nCoV) CORRECTED ON 01/04 AT 0816: PREVIOUSLY REPORTED AS NOT DETECTED    Coronavirus HKU1 NOT DETECTED NOT DETECTED Corrected   Coronavirus NL63 NOT DETECTED NOT DETECTED Corrected   Coronavirus OC43 NOT DETECTED NOT DETECTED Corrected   Metapneumovirus NOT DETECTED NOT DETECTED Corrected   Rhinovirus / Enterovirus NOT DETECTED NOT DETECTED Corrected   Influenza A NOT DETECTED NOT DETECTED Corrected   Influenza B NOT DETECTED NOT DETECTED Corrected   Parainfluenza Virus 1 NOT DETECTED NOT DETECTED Corrected   Parainfluenza Virus 2 NOT DETECTED NOT DETECTED Corrected   Parainfluenza Virus 3 NOT DETECTED NOT DETECTED Corrected   Parainfluenza Virus 4 NOT DETECTED NOT DETECTED Corrected   Respiratory Syncytial Virus NOT DETECTED NOT DETECTED Corrected   Bordetella pertussis NOT DETECTED NOT DETECTED Corrected   Bordetella Parapertussis NOT DETECTED NOT DETECTED Corrected   Chlamydophila pneumoniae NOT DETECTED NOT DETECTED Corrected   Mycoplasma pneumoniae NOT DETECTED NOT DETECTED Corrected    Comment: Performed at [facility], [address], [place], [place] [address]  Blood Culture (routine x 2)     Status: None (Preliminary result)   Collection Time: 09/06/24  4:21 PM   Specimen: BLOOD  Result Value Ref Range Status   Specimen Description BLOOD BLOOD LEFT ARM  Final   Special Requests   Final    BOTTLES DRAWN AEROBIC ONLY Blood Culture results may not be optimal due to an inadequate volume of blood received in culture bottles   Culture   Final    NO GROWTH 3 DAYS Performed at [facility], [address] [place], [place] [address]    Report Status PENDING  Incomplete  Culture, blood (Routine X 2) w Reflex to ID Panel     Status: None (Preliminary result)   Collection Time: 09/06/24  7:02 PM   Specimen: BLOOD  Result Value Ref Range Status   Specimen Description BLOOD BLOOD LEFT  HAND  Final   Special Requests   Final    BOTTLES DRAWN AEROBIC AND ANAEROBIC Blood Culture adequate volume   Culture   Final    NO GROWTH 3 DAYS Performed at [facility], [address] [place], [place] [address]    Report Status PENDING  Incomplete  Culture, blood (Routine X 2) w Reflex to ID Panel     Status: None (Preliminary result)   Collection Time: 09/06/24  7:04 PM   Specimen: BLOOD  Result Value Ref Range Status   Specimen Description BLOOD RIGHT HAND  Final   Special Requests   Final    BOTTLES DRAWN AEROBIC AND ANAEROBIC Blood Culture adequate volume   Culture   Final    NO GROWTH 3 DAYS Performed at [facility], [address] [place], [place] [address]    Report Status PENDING  Incomplete     Radiology Studies: ECHOCARDIOGRAM COMPLETE Result Date: 09/07/2024    ECHOCARDIOGRAM REPORT   Patient Name:   Susan [name] Date of Exam: 09/07/2024 Medical Rec #:  [id number]      Height:       62.0 in Accession #:    [id number]     Weight:       226.4 lb Date of Birth:  Jan 06, 1961      BSA:  2.015 m Patient Age:    63 years       BP:           132/74 mmHg Patient Gender: F              HR:           60 bpm. Exam Location:  Inpatient Procedure: 2D Echo (Both Spectral and Color Flow Doppler were utilized during            procedure). Indications:    I50.40* Unspecified combined systolic (congestive) and diastolic                 (congestive) heart failure  History:        Patient has prior history of Echocardiogram examinations, most                 recent 11/17/2019. CHF, Signs/Symptoms:Altered Mental Status;                 Risk Factors:Hypertension, Current Smoker, Dyslipidemia and                 Diabetes. Schizoprenia. HIV.  Sonographer:    Ellouise Mose RDCS Referring Phys: [id number] [facility]  Sonographer Comments: Image acquisition challenging due to uncooperative patient. Patient screamed at me that she wanted to end exam. EKG failure. IMPRESSIONS  1. Left  ventricular ejection fraction, by estimation, is 55 to 60%. The left ventricle has normal function. The left ventricle has no regional wall motion abnormalities.  2. Right ventricular systolic function was not well visualized.  3. The mitral valve was not well visualized.  4. The aortic valve is calcified. There is mild calcification of the aortic valve. FINDINGS  Left Ventricle: Left ventricular ejection fraction, by estimation, is 55 to 60%. The left ventricle has normal function. The left ventricle has no regional wall motion abnormalities. Right Ventricle: Right ventricular systolic function was not well visualized. Left Atrium: Left atrial size was not well visualized. Right Atrium: Right atrial size was not well visualized. Pericardium: The pericardium was not well visualized. Mitral Valve: The mitral valve was not well visualized. Tricuspid Valve: The tricuspid valve is not well visualized. Aortic Valve: The aortic valve is calcified. There is mild calcification of the aortic valve. Pulmonic Valve: The pulmonic valve was not well visualized. Aorta: Aortic root could not be assessed. Venous: The inferior vena cava was not well visualized.  LEFT VENTRICLE PLAX 2D LVOT diam:     2.10 cm LVOT Area:     3.46 cm   AORTA Ao Root diam: 1.90 cm  SHUNTS Systemic Diam: 2.10 cm Oneil Parchment MD Electronically signed by Oneil Parchment MD Signature Date/Time: 09/07/2024/4:53:21 PM    Final     Scheduled Meds:  abacavir -dolutegravir -lamiVUDine   1 tablet Oral q morning   amLODipine   10 mg Oral Daily   azithromycin   500 mg Oral Daily   budesonide  (PULMICORT ) nebulizer solution  0.25 mg Nebulization BID   cloNIDine   0.2 mg Oral BID   divalproex   500 mg Oral TID   dolutegravir   50 mg Oral Daily   enoxaparin  (LOVENOX ) injection  50 mg Subcutaneous Q24H   gabapentin   300 mg Oral TID   guaiFENesin   600 mg Oral BID   insulin  aspart  0-5 Units Subcutaneous QHS   insulin  aspart  0-9 Units Subcutaneous TID WC    ipratropium-albuterol   3 mL Nebulization BID   lacosamide   150 mg Oral BID   levETIRAcetam   1,000 mg  Oral BID   methylPREDNISolone  (SOLU-MEDROL ) injection  40 mg Intravenous BID   pantoprazole   40 mg Oral Daily   PHENobarbital   64.8 mg Oral BID   risperiDONE   2 mg Oral QHS   rosuvastatin   20 mg Oral Daily   sertraline   100 mg Oral QHS   torsemide   20 mg Oral Daily   triamterene -hydrochlorothiazide   1 tablet Oral Daily   Continuous Infusions:  cefTRIAXone  (ROCEPHIN )  IV 2 g (09/08/24 2224)     LOS: 3 days   Time spent: 40 minutes  Lonni KANDICE Moose, MD  [facility]  09/09/2024, 8:16 AM   "

## 2024-09-10 ENCOUNTER — Inpatient Hospital Stay: Admitting: Nurse Practitioner

## 2024-09-10 ENCOUNTER — Other Ambulatory Visit (HOSPITAL_COMMUNITY): Payer: Self-pay

## 2024-09-10 MED ORDER — PREDNISONE 20 MG PO TABS
40.0000 mg | ORAL_TABLET | Freq: Every day | ORAL | 0 refills | Status: DC
  Filled 2024-09-10: qty 10, 5d supply, fill #0

## 2024-09-10 MED ORDER — PREDNISONE 20 MG PO TABS: 40.0000 mg | ORAL_TABLET | Freq: Every day | ORAL | 0 refills | Status: AC

## 2024-09-10 MED ORDER — TRIUMEQ 600-50-300 MG PO TABS: 1.0000 | ORAL_TABLET | Freq: Every morning | ORAL | 0 refills | Status: AC

## 2024-09-10 NOTE — Discharge Summary (Signed)
 "                                                                                                                                                                               Discharge summary note.  Susan [name] FMW:[id number] DOB: October 11, 1960 DOA: 09/05/2024  PCP: Oley Bascom RAMAN, NP  Admit date: 09/05/2024  Discharge date: 09/10/2024  Admitted From: Home   Disposition:  Home with Home Health   Recommendations for Outpatient Follow-up:   Follow up with PCP in 1-2 weeks  PCP Please obtain BMP/CBC, 2 view CXR in 1week,  (see Discharge instructions)   PCP Please follow up on the following pending results:    Home Health: PT, RN  Equipment/Devices: nebulizer Consultations: None  Discharge Condition: Stable   CODE STATUS: Full   Diet Recommendation: Heart Healthy CHO diet  Diet Order             Diet Carb Modified                    Chief Complaint  Patient presents with   SOB   Weakness     Brief history of present illness from the day of admission and additional interim summary      Susan [name] is a 64 y.o. female with medical history significant of HIV, hypertension, hyperlipidemia, migraine, asthma, type 2 diabetes, seizure disorder, prior CVA with residual deficits, left eye blindness, HFrecEF, marijuana abuse, GERD, anxiety, schizoaffective disorder, chronic low back pain/sciatica, tobacco abuse, GERD, thyroid  nodule.  Admitted to the hospital a month ago for multifocal pneumonia and breakthrough seizure.  Patient presents to the ED tonight via EMS for evaluation of cough, respiratory distress, and generalized weakness.  Oxygen saturation 85% on room air and was tachypneic to the high 30s.  She was wheezing and given albuterol  and Atrovent  by EMS prior to arrival.  Patient noted to be AAO x 2 and slightly confused which per family is her baseline.  In the ED, patient noted be febrile with temperature 102.9 F, tachycardic, and tachypneic on arrival.  Requiring 4  L [place] to maintain sats in the 90s.  Labs showing no leukocytosis, hemoglobin 15.7, platelet count 149k, glucose 147, creatinine 1.12 (close to baseline), AST 63, alk phos 142, ALT and T. bili normal, blood cultures in process, lactic acid normal, proBNP 417, COVID/influenza/RSV PCR negative.  Chest x-ray showing bibasilar focal opacities, left greater than right, possibly representing atelectasis or infiltrates.  Showing diffuse interstitial prominence throughout the lungs which could reflect edema or atypical infection.  Patient was given DuoNeb, Solu-Medrol  125, acetaminophen , ceftriaxone , and azithromycin  in the ED.  TRH called to admit.  Hospital Course    09/07/2024: Patient seen.  Patient remains a poor historian.  Called patient's daughter, Susan [name] went to voicemail.  Patient has refused echocardiogram etc.  However, patient seems to be improving.  Likely discharge in the next 1 to 2 days.  Blood culture has grown Staphylococcus hominis, likely contaminant.  Continue IV Rocephin  and azithromycin  for now.   09/08/2024: Patient states she wants to go home.  Remains on 3 L/min oxygen.  Still bronchospastic.  Unreliable historian.  Denies chest or abdominal pain.   09/09/2024: Patient crying out this morning she wants to go home.  Lungs sound clear today.  Still remains on 3 L/min of oxygen.  I discussed with the nurse and asked her to try to wean her off oxygen.  No family at the bedside.  Is -5 L since admit.  Holding torsemide  and triamterene /HCTZ.  She is refusing blood draw.   09/10/24: Patient weaned off to room air.  SpO2 94% on room air this morning.  Finish 5-day course of prednisone .  Waiting for morning BMP prior to restarting home diuretic.  Nebulizer and home health PT arranged. She continues to refuse blood work. I will restart her diuretic ( triamterene  /hydrochlorothiazide ) but stop torsemide . Needs repeat blood work  when follows up with PCP.   Discharge diagnosis     Principal Problem:   Asthma exacerbation Active Problems:   Acute hypoxemic respiratory failure (HCC)   Pneumonia   Acute exacerbation of CHF (congestive heart failure) (HCC)   SIRS (systemic inflammatory response syndrome) (HCC)    Discharge instructions    Discharge Instructions     (HEART FAILURE PATIENTS) Call MD:  Anytime you have any of the following symptoms: 1) 3 pound weight gain in 24 hours or 5 pounds in 1 week 2) shortness of breath, with or without a dry hacking cough 3) swelling in the hands, feet or stomach 4) if you have to sleep on extra pillows at night in order to breathe.   Complete by: As directed    Call MD for:  difficulty breathing, headache or visual disturbances   Complete by: As directed    Call MD for:  persistant dizziness or light-headedness   Complete by: As directed    Call MD for:  temperature >100.4   Complete by: As directed    Diet Carb Modified   Complete by: As directed    Increase activity slowly   Complete by: As directed        Discharge Medications   Allergies as of 09/10/2024       Reactions   Lyrica  [pregabalin ] Swelling   Has LE swelling        Medication List     TAKE these medications    Accu-Chek Guide Test test strip Generic drug: glucose blood Use as instructed   Accu-Chek Softclix Lancets lancets Use as instructed   acetaminophen  500 MG tablet Commonly known as: TYLENOL  Take 1,000 mg by mouth every 6 (six) hours as needed for mild pain (pain score 1-3), moderate pain (pain score 4-6) or fever.   albuterol  (2.5 MG/3ML) 0.083% nebulizer solution Commonly known as: PROVENTIL  Take 3 mLs (2.5 mg total) by nebulization every 6 (six) hours as needed for wheezing or shortness of breath.   amLODipine  10 MG tablet Commonly known as: NORVASC  Take 1 tablet (10 mg total) by mouth daily.   cloNIDine  0.2 MG tablet Commonly known as: CATAPRES  Take 1 tablet (0.2  mg total) by  mouth 2 (two) times daily.   cyclobenzaprine  10 MG tablet Commonly known as: FLEXERIL  Take 1 tablet (10 mg total) by mouth 3 (three) times daily as needed for muscle spasms.   divalproex  500 MG DR tablet Commonly known as: DEPAKOTE  Take 1 tablet (500 mg total) by mouth 3 (three) times daily.   gabapentin  300 MG capsule Commonly known as: NEURONTIN  TAKE 1 CAPSULE(300 MG) BY MOUTH THREE TIMES DAILY   guaiFENesin  600 MG 12 hr tablet Commonly known as: MUCINEX  Take 1 tablet (600 mg total) by mouth 2 (two) times daily.   Insulin  Pen Needle 31G X 8 MM Misc 1 each by Does not apply route as needed. Use as directed   Lacosamide  150 MG Tabs Take 1 tablet (150 mg total) by mouth 2 (two) times daily.   lactulose  10 GM/15ML solution Commonly known as: CHRONULAC  Take 20 g by mouth daily as needed for mild constipation, moderate constipation or severe constipation.   Lantus  SoloStar 100 UNIT/ML Solostar Pen Generic drug: insulin  glargine ADMINISTER 15 UNITS UNDER THE SKIN DAILY   levETIRAcetam  1000 MG tablet Commonly known as: KEPPRA  Take 1 tablet (1,000 mg total) by mouth 2 (two) times daily.   LORazepam  0.5 MG tablet Commonly known as: ATIVAN  TAKE 1 TABLET(0.5 MG) BY MOUTH EVERY 6 HOURS AS NEEDED FOR ANXIETY What changed: See the new instructions.   metFORMIN  500 MG tablet Commonly known as: GLUCOPHAGE  Take 1 tablet (500 mg total) by mouth 2 (two) times daily.   omeprazole  40 MG capsule Commonly known as: PRILOSEC TAKE 1 CAPSULE(40 MG) BY MOUTH DAILY   PHENobarbital  64.8 MG tablet Commonly known as: LUMINAL Take 1 tablet (64.8 mg total) by mouth 2 (two) times daily. TAKE 1 TABLET(64.8 MG) BY MOUTH TWICE DAILY   predniSONE  20 MG tablet Commonly known as: DELTASONE  Take 2 tablets (40 mg total) by mouth daily with breakfast for 5 days.   risperiDONE  2 MG tablet Commonly known as: RISPERDAL  Take 1 tablet (2 mg total) by mouth at bedtime.   rosuvastatin  20 MG  tablet Commonly known as: CRESTOR  TAKE 1 TABLET(20 MG) BY MOUTH DAILY   sertraline  100 MG tablet Commonly known as: ZOLOFT  Take 1 tablet (100 mg total) by mouth at bedtime.   Tivicay  50 MG tablet Generic drug: dolutegravir  TAKE 1 TABLET(50 MG) BY MOUTH DAILY   triamterene -hydrochlorothiazide  37.5-25 MG tablet Commonly known as: MAXZIDE -25 Take 1 tablet by mouth daily.   Triumeq  600-50-300 MG tablet Generic drug: abacavir -dolutegravir -lamiVUDine  Take 1 tablet by mouth every morning.         Follow-up Information     [facility]) Follow up.   Specialty: Home Health Services Why: [facility] will continue to provide home health services for PT. Contact information: [address] [place]  [phone]        [facility] (DME) Follow up.   Specialty: DME Services Why: [facility] will deliver a nebulizer machine to the hospital to send home with the patient prior to her discharge to home. Contact information: [address] [employer] [place]  [address] [phone]                Major procedures and Radiology Reports - PLEASE review detailed and final reports thoroughly  -        ECHOCARDIOGRAM COMPLETE Result Date: 09/07/2024    ECHOCARDIOGRAM REPORT   Patient Name:   Susan [name] Date of Exam: 09/07/2024 Medical Rec #:  [id number]      Height:       62.0 in Accession #:    [id number]     Weight:       226.4 lb Date of Birth:  11/21/1960      BSA:          2.015 m Patient Age:    63 years       BP:           132/74 mmHg Patient Gender: F              HR:           60 bpm. Exam Location:  Inpatient Procedure: 2D Echo (Both Spectral and Color Flow Doppler were utilized during            procedure). Indications:    I50.40* Unspecified combined systolic (congestive) and diastolic                 (congestive) heart failure  History:        Patient has prior history of Echocardiogram  examinations, most                 recent 11/17/2019. CHF, Signs/Symptoms:Altered Mental Status;                 Risk Factors:Hypertension, Current Smoker, Dyslipidemia and                 Diabetes. Schizoprenia. HIV.  Sonographer:    Ellouise Mose RDCS Referring Phys: [id number] [facility]  Sonographer Comments: Image acquisition challenging due to uncooperative patient. Patient screamed at me that she wanted to end exam. EKG failure. IMPRESSIONS  1. Left ventricular ejection fraction, by estimation, is 55 to 60%. The left ventricle has normal function. The left ventricle has no regional wall motion abnormalities.  2. Right ventricular systolic function was not well visualized.  3. The mitral valve was not well visualized.  4. The aortic valve is calcified. There is mild calcification of the aortic valve. FINDINGS  Left Ventricle: Left ventricular ejection fraction, by estimation, is 55 to 60%. The left ventricle has normal function. The left ventricle has no regional wall motion abnormalities. Right Ventricle: Right ventricular systolic function was not well visualized. Left Atrium: Left atrial size was not well visualized. Right Atrium: Right atrial size was not well visualized. Pericardium: The pericardium was not well visualized. Mitral Valve: The mitral valve was not well visualized. Tricuspid Valve: The tricuspid valve is not well visualized. Aortic Valve: The aortic valve is calcified. There is mild calcification of the aortic valve. Pulmonic Valve: The pulmonic valve was not well visualized. Aorta: Aortic root could not be assessed. Venous: The inferior vena cava was not well visualized.  LEFT VENTRICLE PLAX 2D LVOT diam:     2.10 cm LVOT Area:     3.46 cm   AORTA Ao Root diam: 1.90 cm  SHUNTS Systemic Diam: 2.10 cm Oneil Parchment MD Electronically signed by Oneil Parchment MD Signature Date/Time: 09/07/2024/4:53:21 PM    Final    DG Chest Port 1 View Result Date: 09/05/2024 EXAM: 1 VIEW(S) XRAY OF THE CHEST  09/05/2024 06:36:00 PM COMPARISON: 08/01/2024 CLINICAL HISTORY: Questionable sepsis - evaluate for abnormality FINDINGS: LUNGS AND PLEURA: Diffuse interstitial prominence throughout the lungs. Bibasilar focal opacities could reflect atelectasis or infiltrates, left greater than right. No pleural effusion. No pneumothorax. HEART AND MEDIASTINUM: No acute abnormality of the cardiac and mediastinal silhouettes. BONES AND SOFT TISSUES: No acute osseous abnormality.  IMPRESSION: 1. Bibasilar focal opacities, left greater than right, possibly representing atelectasis or infiltrates. 2. Diffuse interstitial prominence throughout the lungs. This could reflect edema or atypical infection. Electronically signed by: Franky Crease MD 09/05/2024 07:13 PM EST RP Workstation: [id number]    Micro Results    Recent Results (from the past 240 hours)  Blood Culture (routine x 2)     Status: Abnormal   Collection Time: 09/05/24  6:29 PM   Specimen: BLOOD  Result Value Ref Range Status   Specimen Description BLOOD SITE NOT SPECIFIED  Final   Special Requests   Final    BOTTLES DRAWN AEROBIC ONLY Blood Culture results may not be optimal due to an inadequate volume of blood received in culture bottles   Culture  Setup Time   Final    GRAM POSITIVE COCCI AEROBIC BOTTLE ONLY CRITICAL RESULT CALLED TO, READ BACK BY AND VERIFIED WITH: PHARMD ERIN BILLY [id number] AT 1619 BY EC    Culture (A)  Final    STAPHYLOCOCCUS HOMINIS THE SIGNIFICANCE OF ISOLATING THIS ORGANISM FROM A SINGLE SET OF BLOOD CULTURES WHEN MULTIPLE SETS ARE DRAWN IS UNCERTAIN. PLEASE NOTIFY THE MICROBIOLOGY DEPARTMENT WITHIN ONE WEEK IF SPECIATION AND SENSITIVITIES ARE REQUIRED. Performed at [facility], [address] [place], [place] [address]    Report Status 09/08/2024 FINAL  Final  Blood Culture ID Panel (Reflexed)     Status: Abnormal   Collection Time: 09/05/24  6:29 PM  Result Value Ref Range Status   Enterococcus faecalis NOT  DETECTED NOT DETECTED Final   Enterococcus Faecium NOT DETECTED NOT DETECTED Final   Listeria monocytogenes NOT DETECTED NOT DETECTED Final   Staphylococcus species DETECTED (A) NOT DETECTED Final    Comment: CRITICAL RESULT CALLED TO, READ BACK BY AND VERIFIED WITH: PHARMD ROCKY BILLY [id number] AT 1619 BY EC    Staphylococcus aureus (BCID) NOT DETECTED NOT DETECTED Final   Staphylococcus epidermidis NOT DETECTED NOT DETECTED Final   Staphylococcus lugdunensis NOT DETECTED NOT DETECTED Final   Streptococcus species NOT DETECTED NOT DETECTED Final   Streptococcus agalactiae NOT DETECTED NOT DETECTED Final   Streptococcus pneumoniae NOT DETECTED NOT DETECTED Final   Streptococcus pyogenes NOT DETECTED NOT DETECTED Final   A.calcoaceticus-baumannii NOT DETECTED NOT DETECTED Final   Bacteroides fragilis NOT DETECTED NOT DETECTED Final   Enterobacterales NOT DETECTED NOT DETECTED Final   Enterobacter cloacae complex NOT DETECTED NOT DETECTED Final   Escherichia coli NOT DETECTED NOT DETECTED Final   Klebsiella aerogenes NOT DETECTED NOT DETECTED Final   Klebsiella oxytoca NOT DETECTED NOT DETECTED Final   Klebsiella pneumoniae NOT DETECTED NOT DETECTED Final   Proteus species NOT DETECTED NOT DETECTED Final   Salmonella species NOT DETECTED NOT DETECTED Final   Serratia marcescens NOT DETECTED NOT DETECTED Final   Haemophilus influenzae NOT DETECTED NOT DETECTED Final   Neisseria meningitidis NOT DETECTED NOT DETECTED Final   Pseudomonas aeruginosa NOT DETECTED NOT DETECTED Final   Stenotrophomonas maltophilia NOT DETECTED NOT DETECTED Final   Candida albicans NOT DETECTED NOT DETECTED Final   Candida auris NOT DETECTED NOT DETECTED Final   Candida glabrata NOT DETECTED NOT DETECTED Final   Candida krusei NOT DETECTED NOT DETECTED Final   Candida parapsilosis NOT DETECTED NOT DETECTED Final   Candida tropicalis NOT DETECTED NOT DETECTED Final   Cryptococcus neoformans/gattii NOT  DETECTED NOT DETECTED Final    Comment: Performed at [facility], [address], [place], [place] [address]  Resp panel  by RT-PCR (RSV, Flu A&B, Covid) Anterior Nasal Swab     Status: None   Collection Time: 09/05/24  8:15 PM   Specimen: Anterior Nasal Swab  Result Value Ref Range Status   SARS Coronavirus 2 by RT PCR NEGATIVE NEGATIVE Final   Influenza A by PCR NEGATIVE NEGATIVE Final   Influenza B by PCR NEGATIVE NEGATIVE Final    Comment: (NOTE) The Xpert Xpress SARS-CoV-2/FLU/RSV plus assay is intended as an aid in the diagnosis of influenza from Nasopharyngeal swab specimens and should not be used as a sole basis for treatment. Nasal washings and aspirates are unacceptable for Xpert Xpress SARS-CoV-2/FLU/RSV testing.  Fact Sheet for Patients: bloggercourse.com  Fact Sheet for Healthcare Providers: seriousbroker.it  This test is not yet approved or cleared by the [place]  FDA and has been authorized for detection and/or diagnosis of SARS-CoV-2 by FDA under an Emergency Use Authorization (EUA). This EUA will remain in effect (meaning this test can be used) for the duration of the COVID-19 declaration under Section 564(b)(1) of the Act, 21 U.S.C. section 360bbb-3(b)(1), unless the authorization is terminated or revoked.     Resp Syncytial Virus by PCR NEGATIVE NEGATIVE Final    Comment: (NOTE) Fact Sheet for Patients: bloggercourse.com  Fact Sheet for Healthcare Providers: seriousbroker.it  This test is not yet approved or cleared by the [place]  FDA and has been authorized for detection and/or diagnosis of SARS-CoV-2 by FDA under an Emergency Use Authorization (EUA). This EUA will remain in effect (meaning this test can be used) for the duration of the COVID-19 declaration under Section 564(b)(1) of the Act, 21 U.S.C. section 360bbb-3(b)(1), unless the  authorization is terminated or revoked.  Performed at [facility], [address], [place], [place] [address]   Respiratory (~20 pathogens) panel by PCR     Status: None   Collection Time: 09/06/24  1:16 AM   Specimen: Nasopharyngeal Swab; Respiratory  Result Value Ref Range Status   Adenovirus NOT DETECTED NOT DETECTED Corrected   Coronavirus 229E NOT DETECTED NOT DETECTED Corrected    Comment: (NOTE) The Coronavirus on the Respiratory Panel, DOES NOT test for the novel  Coronavirus (2019 nCoV) CORRECTED ON 01/04 AT 0816: PREVIOUSLY REPORTED AS NOT DETECTED    Coronavirus HKU1 NOT DETECTED NOT DETECTED Corrected   Coronavirus NL63 NOT DETECTED NOT DETECTED Corrected   Coronavirus OC43 NOT DETECTED NOT DETECTED Corrected   Metapneumovirus NOT DETECTED NOT DETECTED Corrected   Rhinovirus / Enterovirus NOT DETECTED NOT DETECTED Corrected   Influenza A NOT DETECTED NOT DETECTED Corrected   Influenza B NOT DETECTED NOT DETECTED Corrected   Parainfluenza Virus 1 NOT DETECTED NOT DETECTED Corrected   Parainfluenza Virus 2 NOT DETECTED NOT DETECTED Corrected   Parainfluenza Virus 3 NOT DETECTED NOT DETECTED Corrected   Parainfluenza Virus 4 NOT DETECTED NOT DETECTED Corrected   Respiratory Syncytial Virus NOT DETECTED NOT DETECTED Corrected   Bordetella pertussis NOT DETECTED NOT DETECTED Corrected   Bordetella Parapertussis NOT DETECTED NOT DETECTED Corrected   Chlamydophila pneumoniae NOT DETECTED NOT DETECTED Corrected   Mycoplasma pneumoniae NOT DETECTED NOT DETECTED Corrected    Comment: Performed at [facility], [address], [place], [place] [address]  Blood Culture (routine x 2)     Status: None (Preliminary result)   Collection Time: 09/06/24  4:21 PM   Specimen: BLOOD  Result Value Ref Range Status   Specimen Description BLOOD BLOOD LEFT ARM  Final   Special Requests  Final    BOTTLES DRAWN AEROBIC ONLY Blood Culture results may not be optimal due to an  inadequate volume of blood received in culture bottles   Culture   Final    NO GROWTH 4 DAYS Performed at [facility], [address] [place], [place] [address]    Report Status PENDING  Incomplete  Culture, blood (Routine X 2) w Reflex to ID Panel     Status: None (Preliminary result)   Collection Time: 09/06/24  7:02 PM   Specimen: BLOOD  Result Value Ref Range Status   Specimen Description BLOOD BLOOD LEFT HAND  Final   Special Requests   Final    BOTTLES DRAWN AEROBIC AND ANAEROBIC Blood Culture adequate volume   Culture   Final    NO GROWTH 4 DAYS Performed at [facility], [address], [place], [place] [address]    Report Status PENDING  Incomplete  Culture, blood (Routine X 2) w Reflex to ID Panel     Status: None (Preliminary result)   Collection Time: 09/06/24  7:04 PM   Specimen: BLOOD  Result Value Ref Range Status   Specimen Description BLOOD RIGHT HAND  Final   Special Requests   Final    BOTTLES DRAWN AEROBIC AND ANAEROBIC Blood Culture adequate volume   Culture   Final    NO GROWTH 4 DAYS Performed at [facility], [address] [place], [place] [address]    Report Status PENDING  Incomplete    Today   Subjective    Susan [name] today has no headache,no chest or abdominal pain,no new weakness tingling or numbness, feels much better wants to go home today.    Objective   Blood pressure (!) 176/82, pulse 73, temperature 97.6 F (36.4 C), temperature source Oral, resp. rate 18, height 5' 2 (1.575 m), weight 97.7 kg, SpO2 100%.   Intake/Output Summary (Last 24 hours) at 09/10/2024 1722 Last data filed at 09/10/2024 0500 Gross per 24 hour  Intake --  Output 700 ml  Net -700 ml    Exam  Awake Alert, No new F.N deficits,    [place].AT,PERRAL Supple Neck,   Symmetrical Chest wall movement, Good air movement bilaterally, CTAB RRR,No Gallops,   +ve B.Sounds, Abd Soft, Non tender,  No Cyanosis, Clubbing or edema    Data Review    Recent Labs  Lab 09/05/24 1826 09/06/24 0408  WBC 6.4 5.2  HGB 15.7* 13.4  HCT 47.5* 41.1  PLT 149* 124*  MCV 99.6 99.8  MCH 32.9 32.5  MCHC 33.1 32.6  RDW 14.6 14.6  LYMPHSABS 2.3  --   MONOABS 0.5  --   EOSABS 0.0  --   BASOSABS 0.0  --     Recent Labs  Lab 09/05/24 1826 09/06/24 0408 09/08/24 0511 09/08/24 1811  NA 139 140 141 138  K 4.5 4.2 4.6 4.3  CL 100 101 102 98  CO2 25 25 30 27   ANIONGAP 14 13 9 13   GLUCOSE 147* 273* 183* 210*  BUN 18 23 27* 34*  CREATININE 1.12* 1.10* 0.98 1.45*  AST 63* 47*  --   --   ALT 34 34  --   --   ALKPHOS 142* 117  --   --   BILITOT 0.3 <0.2  --   --   ALBUMIN 3.8 3.4* 3.2*  --   PROCALCITON  --  <0.10  --   --   LATICACIDVEN 1.8  --   --   --  INR 0.9  --   --   --   MG  --   --  2.4  --   PHOS  --   --  4.1  --   CALCIUM  8.9 8.2* 8.1* 8.2*    Total Time in preparing paper work, data evaluation and todays exam - 35 minutes  Signature  -    Lonni KANDICE Moose M.D on 09/10/2024 at 5:22 PM   -  To page go to [email or web address]      "

## 2024-09-10 NOTE — Progress Notes (Signed)
 " PROGRESS NOTE    Susan [name]  FMW:[id number] DOB: 03-12-1961 DOA: 09/05/2024 PCP: Oley Bascom RAMAN, NP  Brief Narrative:  - As per H&P done on presentation: Susan [name] is a 64 y.o. female with medical history significant of HIV, hypertension, hyperlipidemia, migraine, asthma, type 2 diabetes, seizure disorder, prior CVA with residual deficits, left eye blindness, HFrecEF, marijuana abuse, GERD, anxiety, schizoaffective disorder, chronic low back pain/sciatica, tobacco abuse, GERD, thyroid  nodule.  Admitted to the hospital a month ago for multifocal pneumonia and breakthrough seizure.  Patient presents to the ED tonight via EMS for evaluation of cough, respiratory distress, and generalized weakness.  Oxygen saturation 85% on room air and was tachypneic to the high 30s.  She was wheezing and given albuterol  and Atrovent  by EMS prior to arrival.  Patient noted to be AAO x 2 and slightly confused which per family is her baseline.  In the ED, patient noted be febrile with temperature 102.9 F, tachycardic, and tachypneic on arrival.  Requiring 4 L [place] to maintain sats in the 90s.  Labs showing no leukocytosis, hemoglobin 15.7, platelet count 149k, glucose 147, creatinine 1.12 (close to baseline), AST 63, alk phos 142, ALT and T. bili normal, blood cultures in process, lactic acid normal, proBNP 417, COVID/influenza/RSV PCR negative.  Chest x-ray showing bibasilar focal opacities, left greater than right, possibly representing atelectasis or infiltrates.  Showing diffuse interstitial prominence throughout the lungs which could reflect edema or atypical infection.  Patient was given DuoNeb, Solu-Medrol  125, acetaminophen , ceftriaxone , and azithromycin  in the ED.  [facility] called to admit.   Patient is currently AAO x 2.  She is not able to give much history.  Reports having cough and shortness of breath, unclear when her symptoms started.  She reports taking all of her medications at home but does not know which  medications she takes as her daughter gives them to her.  Denies fevers, chest pain, nausea, vomiting, abdominal pain, or diarrhea.   09/07/2024: Patient seen.  Patient remains a poor historian.  Called patient's daughter, Alisse Tuite went to voicemail.  Patient has refused echocardiogram etc.  However, patient seems to be improving.  Likely discharge in the next 1 to 2 days.  Blood culture has grown Staphylococcus hominis, likely contaminant.  Continue IV Rocephin  and azithromycin  for now.  09/08/2024: Patient states she wants to go home.  Remains on 3 L/min oxygen.  Still bronchospastic.  Unreliable historian.  Denies chest or abdominal pain.  09/09/2024: Patient crying out this morning she wants to go home.  Lungs sound clear today.  Still remains on 3 L/min of oxygen.  I discussed with the nurse and asked her to try to wean her off oxygen.  No family at the bedside.  Is -5 L since admit.  Holding torsemide  and triamterene /HCTZ.  She is refusing blood draw.  09/10/24: Patient weaned off to room air.  SpO2 94% on room air this morning.  Finish 5-day course of prednisone .  Waiting for morning BMP prior to restarting home diuretic.  Nebulizer and home health PT arranged. She continues to refuse blood work. I will restart her diuretic ( triamterene  /hydrochlorothiazide ) but stop lasix . Needs repeat blood work when follows up with PCP.     Assessment & Plan:   Principal Problem:   Asthma exacerbation Active Problems:   Acute hypoxemic respiratory failure (HCC)   Pneumonia   Acute exacerbation of CHF (congestive heart failure) (HCC)   SIRS (systemic inflammatory response syndrome) (  HCC)     SIRS: Possible asthma without acute exacerbation: Acute hypoxemic respiratory failure: Possible pneumonia: - Continue nebs at home, change to po steroids. - finished 5 days of azithromycin  and Rocephin  - Patient's air entry and BS much improved   Acute on chronic HFrecEF: -Repeat echo (low quality).   However, normal left ventricular EF reported. -Torsemide  20 Mg p.o. once dail- ON HOLD at d/c until she follows up with PCP - I's and O's - 5071 for the hospital stay - hold torsemide , continue triamterene /hydrochlorothiazide  , hold torsemide  until she follows up with PCP ( within 1 week). NO peripheral edema -recheck BMP as creat bumped from 0.98 -> 1.45  ( AKI)- refuses blood draws  AKI - hold torsemide  restart triamterene  -refused blood work again   HIV CD4 count 418 in February 2025.  Continue Tivicay  and Triumeq .   Hypertension -stable for discharge   Hyperlipidemia Continue Crestor .   Type 2 diabetes Hemoglobin A1c 6.7 in September 2025.  Patient is no longer on Lantus  at home.  Placed on sensitive sliding scale insulin  ACHS.   Seizure disorder Continue Depakote , lacosamide , phenobarbital , and Keppra .   History of prior CVA with residual deficits Continue statin.  She is not on any antiplatelet agent?   Anxiety, schizoaffective disorder Continue risperidone , sertraline , and Ativan  PRN.   Chronic low back pain/sciatica Continue home gabapentin  and Flexeril  PRN.   GERD Continue PPI.     DVT prophylaxis: Lovenox  Code Status: Full code Family Communication: No family at the bedside. Disposition Plan: stable for discharge, she needs close outpatient follow up   Reason for continuing need for hospitalization: weaned off O2, feels much better.    Objective: Vitals:   09/09/24 1900 09/09/24 2103 09/09/24 2300 09/10/24 0500  BP: (!) 144/66  (!) 130/55 (!) 153/58  Pulse: 72  77 64  Resp: 18  17 18   Temp: 98.5 F (36.9 C)  99 F (37.2 C) 98.2 F (36.8 C)  TempSrc: Oral  Oral Oral  SpO2: (!) 89% 93% 91% 94%  Weight:    97.7 kg  Height:        Intake/Output Summary (Last 24 hours) at 09/10/2024 0752 Last data filed at 09/10/2024 0500 Gross per 24 hour  Intake --  Output 700 ml  Net -700 ml   Filed Weights   09/08/24 0500 09/09/24 0500 09/10/24 0500  Weight:  101 kg 98 kg 97.7 kg    Examination:  Physical Exam Constitutional:      Appearance: She is obese.  HENT:     Head: Normocephalic and atraumatic.     Mouth/Throat:     Pharynx: Oropharynx is clear.  Eyes:     Extraocular Movements: Extraocular movements intact.     Pupils: Pupils are equal, round, and reactive to light.  Cardiovascular:     Rate and Rhythm: Normal rate and regular rhythm.     Heart sounds: No murmur heard. Pulmonary:     Breath sounds: Normal breath sounds. No stridor. No wheezing, rhonchi or rales.  Abdominal:     Palpations: Abdomen is soft.     Tenderness: There is no abdominal tenderness. There is no guarding or rebound.  Musculoskeletal:        General: No swelling.     Cervical back: Neck supple.     Right lower leg: No edema.     Left lower leg: No edema.  Skin:    General: Skin is warm and dry.  Neurological:  General: No focal deficit present.     Mental Status: She is alert.  Psychiatric:        Mood and Affect: Mood normal.     Data Reviewed: I have personally reviewed following labs and imaging studies  CBC: Recent Labs  Lab 09/05/24 1826 09/06/24 0408  WBC 6.4 5.2  NEUTROABS 3.4  --   HGB 15.7* 13.4  HCT 47.5* 41.1  MCV 99.6 99.8  PLT 149* 124*   Basic Metabolic Panel: Recent Labs  Lab 09/05/24 1826 09/06/24 0408 09/08/24 0511 09/08/24 1811  NA 139 140 141 138  K 4.5 4.2 4.6 4.3  CL 100 101 102 98  CO2 25 25 30 27   GLUCOSE 147* 273* 183* 210*  BUN 18 23 27* 34*  CREATININE 1.12* 1.10* 0.98 1.45*  CALCIUM  8.9 8.2* 8.1* 8.2*  MG  --   --  2.4  --   PHOS  --   --  4.1  --    GFR: Estimated Creatinine Clearance: 43.9 mL/min (A) (by C-G formula based on SCr of 1.45 mg/dL (H)). Liver Function Tests: Recent Labs  Lab 09/05/24 1826 09/06/24 0408 09/08/24 0511  AST 63* 47*  --   ALT 34 34  --   ALKPHOS 142* 117  --   BILITOT 0.3 <0.2  --   PROT 7.9 6.7  --   ALBUMIN 3.8 3.4* 3.2*   No results for input(s):  LIPASE, AMYLASE in the last 168 hours. No results for input(s): AMMONIA in the last 168 hours. Coagulation Profile: Recent Labs  Lab 09/05/24 1826  INR 0.9   Cardiac Enzymes: No results for input(s): CKTOTAL, CKMB, CKMBINDEX, TROPONINI in the last 168 hours. ProBNP, BNP (last 5 results) Recent Labs    09/05/24 1826  PROBNP 417.0*   HbA1C: No results for input(s): HGBA1C in the last 72 hours. CBG: Recent Labs  Lab 09/08/24 2216 09/09/24 0719 09/09/24 1129 09/09/24 1627 09/09/24 2327  GLUCAP 286* 193* 264* 193* 326*   Lipid Profile: No results for input(s): CHOL, HDL, LDLCALC, TRIG, CHOLHDL, LDLDIRECT in the last 72 hours. Thyroid  Function Tests: No results for input(s): TSH, T4TOTAL, FREET4, T3FREE, THYROIDAB in the last 72 hours. Anemia Panel: No results for input(s): VITAMINB12, FOLATE, FERRITIN, TIBC, IRON, RETICCTPCT in the last 72 hours. Sepsis Labs: Recent Labs  Lab 09/05/24 1826 09/06/24 0408  PROCALCITON  --  <0.10  LATICACIDVEN 1.8  --     Recent Results (from the past 240 hours)  Blood Culture (routine x 2)     Status: Abnormal   Collection Time: 09/05/24  6:29 PM   Specimen: BLOOD  Result Value Ref Range Status   Specimen Description BLOOD SITE NOT SPECIFIED  Final   Special Requests   Final    BOTTLES DRAWN AEROBIC ONLY Blood Culture results may not be optimal due to an inadequate volume of blood received in culture bottles   Culture  Setup Time   Final    GRAM POSITIVE COCCI AEROBIC BOTTLE ONLY CRITICAL RESULT CALLED TO, READ BACK BY AND VERIFIED WITH: PHARMD ERIN BILLY [id number] AT 1619 BY EC    Culture (A)  Final    STAPHYLOCOCCUS HOMINIS THE SIGNIFICANCE OF ISOLATING THIS ORGANISM FROM A SINGLE SET OF BLOOD CULTURES WHEN MULTIPLE SETS ARE DRAWN IS UNCERTAIN. PLEASE NOTIFY THE MICROBIOLOGY DEPARTMENT WITHIN ONE WEEK IF SPECIATION AND SENSITIVITIES ARE REQUIRED. Performed at [facility], [address] [place], [place] [address]    Report Status  09/08/2024 FINAL  Final  Blood Culture ID Panel (Reflexed)     Status: Abnormal   Collection Time: 09/05/24  6:29 PM  Result Value Ref Range Status   Enterococcus faecalis NOT DETECTED NOT DETECTED Final   Enterococcus Faecium NOT DETECTED NOT DETECTED Final   Listeria monocytogenes NOT DETECTED NOT DETECTED Final   Staphylococcus species DETECTED (A) NOT DETECTED Final    Comment: CRITICAL RESULT CALLED TO, READ BACK BY AND VERIFIED WITH: PHARMD ROCKY SLADE [id number] AT 1619 BY EC    Staphylococcus aureus (BCID) NOT DETECTED NOT DETECTED Final   Staphylococcus epidermidis NOT DETECTED NOT DETECTED Final   Staphylococcus lugdunensis NOT DETECTED NOT DETECTED Final   Streptococcus species NOT DETECTED NOT DETECTED Final   Streptococcus agalactiae NOT DETECTED NOT DETECTED Final   Streptococcus pneumoniae NOT DETECTED NOT DETECTED Final   Streptococcus pyogenes NOT DETECTED NOT DETECTED Final   A.calcoaceticus-baumannii NOT DETECTED NOT DETECTED Final   Bacteroides fragilis NOT DETECTED NOT DETECTED Final   Enterobacterales NOT DETECTED NOT DETECTED Final   Enterobacter cloacae complex NOT DETECTED NOT DETECTED Final   Escherichia coli NOT DETECTED NOT DETECTED Final   Klebsiella aerogenes NOT DETECTED NOT DETECTED Final   Klebsiella oxytoca NOT DETECTED NOT DETECTED Final   Klebsiella pneumoniae NOT DETECTED NOT DETECTED Final   Proteus species NOT DETECTED NOT DETECTED Final   Salmonella species NOT DETECTED NOT DETECTED Final   Serratia marcescens NOT DETECTED NOT DETECTED Final   Haemophilus influenzae NOT DETECTED NOT DETECTED Final   Neisseria meningitidis NOT DETECTED NOT DETECTED Final   Pseudomonas aeruginosa NOT DETECTED NOT DETECTED Final   Stenotrophomonas maltophilia NOT DETECTED NOT DETECTED Final   Candida albicans NOT DETECTED NOT DETECTED Final   Candida auris NOT DETECTED NOT DETECTED Final    Candida glabrata NOT DETECTED NOT DETECTED Final   Candida krusei NOT DETECTED NOT DETECTED Final   Candida parapsilosis NOT DETECTED NOT DETECTED Final   Candida tropicalis NOT DETECTED NOT DETECTED Final   Cryptococcus neoformans/gattii NOT DETECTED NOT DETECTED Final    Comment: Performed at [facility], [address], [place], [place] [address]  Resp panel by RT-PCR (RSV, Flu A&B, Covid) Anterior Nasal Swab     Status: None   Collection Time: 09/05/24  8:15 PM   Specimen: Anterior Nasal Swab  Result Value Ref Range Status   SARS Coronavirus 2 by RT PCR NEGATIVE NEGATIVE Final   Influenza A by PCR NEGATIVE NEGATIVE Final   Influenza B by PCR NEGATIVE NEGATIVE Final    Comment: (NOTE) The Xpert Xpress SARS-CoV-2/FLU/RSV plus assay is intended as an aid in the diagnosis of influenza from Nasopharyngeal swab specimens and should not be used as a sole basis for treatment. Nasal washings and aspirates are unacceptable for Xpert Xpress SARS-CoV-2/FLU/RSV testing.  Fact Sheet for Patients: bloggercourse.com  Fact Sheet for Healthcare Providers: seriousbroker.it  This test is not yet approved or cleared by the [place]  FDA and has been authorized for detection and/or diagnosis of SARS-CoV-2 by FDA under an Emergency Use Authorization (EUA). This EUA will remain in effect (meaning this test can be used) for the duration of the COVID-19 declaration under Section 564(b)(1) of the Act, 21 U.S.C. section 360bbb-3(b)(1), unless the authorization is terminated or revoked.     Resp Syncytial Virus by PCR NEGATIVE NEGATIVE Final    Comment: (NOTE) Fact Sheet for Patients: bloggercourse.com  Fact Sheet for Healthcare Providers: seriousbroker.it  This test is not yet approved or  cleared by the [place]  FDA and has been authorized for detection and/or diagnosis of SARS-CoV-2  by FDA under an Emergency Use Authorization (EUA). This EUA will remain in effect (meaning this test can be used) for the duration of the COVID-19 declaration under Section 564(b)(1) of the Act, 21 U.S.C. section 360bbb-3(b)(1), unless the authorization is terminated or revoked.  Performed at [facility], [address], [place], [place] [address]   Respiratory (~20 pathogens) panel by PCR     Status: None   Collection Time: 09/06/24  1:16 AM   Specimen: Nasopharyngeal Swab; Respiratory  Result Value Ref Range Status   Adenovirus NOT DETECTED NOT DETECTED Corrected   Coronavirus 229E NOT DETECTED NOT DETECTED Corrected    Comment: (NOTE) The Coronavirus on the Respiratory Panel, DOES NOT test for the novel  Coronavirus (2019 nCoV) CORRECTED ON 01/04 AT 0816: PREVIOUSLY REPORTED AS NOT DETECTED    Coronavirus HKU1 NOT DETECTED NOT DETECTED Corrected   Coronavirus NL63 NOT DETECTED NOT DETECTED Corrected   Coronavirus OC43 NOT DETECTED NOT DETECTED Corrected   Metapneumovirus NOT DETECTED NOT DETECTED Corrected   Rhinovirus / Enterovirus NOT DETECTED NOT DETECTED Corrected   Influenza A NOT DETECTED NOT DETECTED Corrected   Influenza B NOT DETECTED NOT DETECTED Corrected   Parainfluenza Virus 1 NOT DETECTED NOT DETECTED Corrected   Parainfluenza Virus 2 NOT DETECTED NOT DETECTED Corrected   Parainfluenza Virus 3 NOT DETECTED NOT DETECTED Corrected   Parainfluenza Virus 4 NOT DETECTED NOT DETECTED Corrected   Respiratory Syncytial Virus NOT DETECTED NOT DETECTED Corrected   Bordetella pertussis NOT DETECTED NOT DETECTED Corrected   Bordetella Parapertussis NOT DETECTED NOT DETECTED Corrected   Chlamydophila pneumoniae NOT DETECTED NOT DETECTED Corrected   Mycoplasma pneumoniae NOT DETECTED NOT DETECTED Corrected    Comment: Performed at [facility], [address] [place], [place] [address]  Blood Culture (routine x 2)     Status: None (Preliminary result)    Collection Time: 09/06/24  4:21 PM   Specimen: BLOOD  Result Value Ref Range Status   Specimen Description BLOOD BLOOD LEFT ARM  Final   Special Requests   Final    BOTTLES DRAWN AEROBIC ONLY Blood Culture results may not be optimal due to an inadequate volume of blood received in culture bottles   Culture   Final    NO GROWTH 4 DAYS Performed at [facility], [address], [place], [place] [address]    Report Status PENDING  Incomplete  Culture, blood (Routine X 2) w Reflex to ID Panel     Status: None (Preliminary result)   Collection Time: 09/06/24  7:02 PM   Specimen: BLOOD  Result Value Ref Range Status   Specimen Description BLOOD BLOOD LEFT HAND  Final   Special Requests   Final    BOTTLES DRAWN AEROBIC AND ANAEROBIC Blood Culture adequate volume   Culture   Final    NO GROWTH 4 DAYS Performed at [facility], [address] [place], [place] [address]    Report Status PENDING  Incomplete  Culture, blood (Routine X 2) w Reflex to ID Panel     Status: None (Preliminary result)   Collection Time: 09/06/24  7:04 PM   Specimen: BLOOD  Result Value Ref Range Status   Specimen Description BLOOD RIGHT HAND  Final   Special Requests   Final    BOTTLES DRAWN AEROBIC AND ANAEROBIC Blood Culture adequate volume  Culture   Final    NO GROWTH 4 DAYS Performed at [facility], [address] [place], [place] [address]    Report Status PENDING  Incomplete     Radiology Studies: No results found.   Scheduled Meds:  abacavir -dolutegravir -lamiVUDine   1 tablet Oral q morning   amLODipine   10 mg Oral Daily   budesonide  (PULMICORT ) nebulizer solution  0.25 mg Nebulization BID   cloNIDine   0.2 mg Oral BID   divalproex   500 mg Oral TID   dolutegravir   50 mg Oral Daily   enoxaparin  (LOVENOX ) injection  50 mg Subcutaneous Q24H   gabapentin   300 mg Oral TID   guaiFENesin   600 mg Oral BID   insulin  aspart  0-5 Units Subcutaneous QHS   insulin  aspart  0-9 Units  Subcutaneous TID WC   ipratropium-albuterol   3 mL Nebulization BID   lacosamide   150 mg Oral BID   levETIRAcetam   1,000 mg Oral BID   methylPREDNISolone  (SOLU-MEDROL ) injection  40 mg Intravenous BID   pantoprazole   40 mg Oral Daily   PHENobarbital   64.8 mg Oral BID   risperiDONE   2 mg Oral QHS   rosuvastatin   20 mg Oral Daily   sertraline   100 mg Oral QHS   Continuous Infusions:     LOS: 4 days   Time spent: 40 minutes  Lonni KANDICE Moose, MD  [facility]  09/10/2024, 7:52 AM   "

## 2024-09-10 NOTE — Progress Notes (Signed)
" °  Progress Note   Date: 09/10/2024  Patient Name: Susan [name]        MRN#: [id number]   Clarification of diagnosis:   Bacterial pneumonia (document organism if known) - Probable pneumonia bacterial organism unknown     "

## 2024-09-10 NOTE — Progress Notes (Signed)
 CN attempted to call and go over discharge instructions with daughter. CN left voicemail to call back. CM notified. PT[facility] has been arranged.

## 2024-09-10 NOTE — Progress Notes (Signed)
 CN spoke to daughter and provided discharge instructions. CN updated on medication changes and where to pick up meds. Patient has nebulizer machine, sent with [facility]. CN updated MD to transfer meds to [facility] that was ordered from [facility].

## 2024-09-10 NOTE — TOC Transition Note (Signed)
 Transition of Care [facility]) - Discharge Note   Patient Details  Name: Susan [name] MRN: [id number] Date of Birth: 1961-05-16  Transition of Care [facility]) CM/SW Contact:  Rosaline JONELLE Joe, RN Phone Number: 09/10/2024, 10:11 AM   Clinical Narrative:    CM called and spoke with the patient's daughter by phone and she will be present at the home at 11 am for patient to return.  Discharge order was placed by the MD.  [facility] was called and patient was set up for transport home at 11 am today.  Nebulizer was ordered yesterday and [employer] requested to deliver to the bedside today prior to transport to home.  Bedside nursing, please call report to the daughter by phone.  Daughter asked that renewed prescription to be called to [facility] for Triumeq .  MD is aware.         Patient Goals and CMS Choice            Discharge Placement                       Discharge Plan and Services Additional resources added to the After Visit Summary for                                       Social Drivers of Health (SDOH) Interventions SDOH Screenings   Food Insecurity: Unknown (09/07/2024)  Housing: Low Risk (09/07/2024)  Transportation Needs: No Transportation Needs (09/07/2024)  Utilities: Not At Risk (09/07/2024)  Depression (PHQ2-9): Low Risk (01/25/2023)  Social Connections: Patient Unable To Answer (08/02/2024)  Tobacco Use: High Risk (08/02/2024)     Readmission Risk Interventions    09/09/2024    4:01 PM 09/08/2024   11:58 AM  Readmission Risk Prevention Plan  Transportation Screening Complete Complete  Medication Review Oceanographer) Complete Complete  PCP or Specialist appointment within 3-5 days of discharge Complete Complete  HRI or Home Care Consult Complete Complete  SW Recovery Care/Counseling Consult Complete Complete  Palliative Care Screening Not Applicable Not Applicable  Skilled Nursing Facility Not Applicable Complete

## 2024-09-11 LAB — CULTURE, BLOOD (ROUTINE X 2)
Culture: NO GROWTH
Culture: NO GROWTH
Culture: NO GROWTH
Special Requests: ADEQUATE
Special Requests: ADEQUATE

## 2024-09-14 ENCOUNTER — Encounter: Payer: Self-pay | Admitting: Nurse Practitioner

## 2024-09-14 ENCOUNTER — Telehealth: Payer: Self-pay

## 2024-09-14 NOTE — Telephone Encounter (Signed)
 Please Advise.  CB.  Copied from CRM [id number]. Topic: Clinical - Home Health Verbal Orders >> Sep 14, 2024  9:30 AM Suzen RAMAN wrote: Caller/Agency:Maria; [facility] Callback Number: [phone] Service Requested: Physical Therapy Frequency: Resumption of care 09/16/23 Any new concerns about the patient? No

## 2024-09-15 ENCOUNTER — Telehealth: Payer: Self-pay

## 2024-09-15 ENCOUNTER — Ambulatory Visit: Admitting: Diagnostic Neuroimaging

## 2024-09-15 ENCOUNTER — Encounter: Payer: Self-pay | Admitting: Diagnostic Neuroimaging

## 2024-09-15 NOTE — Telephone Encounter (Signed)
 Copied from CRM [id number]. Topic: Clinical - Home Health Verbal Orders >> Sep 15, 2024 12:48 PM Zebedee SAUNDERS wrote: Caller/Agency: [facility] per Deane Rushing Number: [phone] Service Requested: Physical Therapy Frequency: Resume PT revaluation  Any new concerns about the patient? No  CB is calling. KH

## 2024-09-15 NOTE — Telephone Encounter (Signed)
 Left message giving verbal orders on below request. CB.

## 2024-09-15 NOTE — Telephone Encounter (Signed)
 Copied from CRM [id number]. Topic: General - Call Back - No Documentation >> Sep 15, 2024  1:22 PM Lonell PEDLAR wrote: Reason for CRM: Patient's daughter, stated that she received a call, no notes on file. Patient was recently hospitalized with pneumonia. Please review and advise. C/b if necessary [phone]

## 2024-09-16 ENCOUNTER — Ambulatory Visit: Payer: Self-pay | Admitting: Infectious Diseases
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
## 2024-09-17 NOTE — Telephone Encounter (Signed)
Appt was made. KH 

## 2024-09-21 ENCOUNTER — Telehealth: Payer: Self-pay

## 2024-09-21 NOTE — Telephone Encounter (Signed)
 Copied from CRM [id number]. Topic: Clinical - Home Health Verbal Orders >> Sep 21, 2024  8:38 AM Larissa RAMAN wrote: Caller/Agency: [facility]  Callback Number: [phone] Service Requested: Occupational Therapy, Physical Therapy, Speech Therapy  Frequency: Physical 2/week for 5 weeks, OT and Speech- Evaulations Any new concerns about the patient? No   Please advise on verbal orders. CB.

## 2024-09-23 ENCOUNTER — Other Ambulatory Visit: Payer: Self-pay | Admitting: Nurse Practitioner

## 2024-09-23 DIAGNOSIS — F250 Schizoaffective disorder, bipolar type: Secondary | ICD-10-CM

## 2024-09-23 NOTE — Telephone Encounter (Signed)
 Left message giving verbal orders. CB.

## 2024-09-23 NOTE — Telephone Encounter (Signed)
 Please advise [facility]

## 2024-09-25 ENCOUNTER — Ambulatory Visit (HOSPITAL_BASED_OUTPATIENT_CLINIC_OR_DEPARTMENT_OTHER): Admitting: Family Medicine

## 2024-09-26 ENCOUNTER — Other Ambulatory Visit: Payer: Self-pay | Admitting: Nurse Practitioner

## 2024-09-26 DIAGNOSIS — I10 Essential (primary) hypertension: Secondary | ICD-10-CM

## 2024-09-28 ENCOUNTER — Telehealth (HOSPITAL_BASED_OUTPATIENT_CLINIC_OR_DEPARTMENT_OTHER)

## 2024-09-28 ENCOUNTER — Encounter (HOSPITAL_COMMUNITY): Payer: Self-pay

## 2024-09-28 DIAGNOSIS — F17200 Nicotine dependence, unspecified, uncomplicated: Secondary | ICD-10-CM

## 2024-09-28 DIAGNOSIS — F411 Generalized anxiety disorder: Secondary | ICD-10-CM | POA: Diagnosis not present

## 2024-09-28 DIAGNOSIS — F250 Schizoaffective disorder, bipolar type: Secondary | ICD-10-CM

## 2024-09-28 DIAGNOSIS — Z8673 Personal history of transient ischemic attack (TIA), and cerebral infarction without residual deficits: Secondary | ICD-10-CM | POA: Diagnosis not present

## 2024-09-28 DIAGNOSIS — F331 Major depressive disorder, recurrent, moderate: Secondary | ICD-10-CM

## 2024-09-28 DIAGNOSIS — F17210 Nicotine dependence, cigarettes, uncomplicated: Secondary | ICD-10-CM

## 2024-09-28 DIAGNOSIS — G4700 Insomnia, unspecified: Secondary | ICD-10-CM

## 2024-09-28 DIAGNOSIS — F4310 Post-traumatic stress disorder, unspecified: Secondary | ICD-10-CM

## 2024-09-28 MED ORDER — QUETIAPINE FUMARATE 50 MG PO TABS: 50.0000 mg | ORAL_TABLET | Freq: Every day | ORAL | 1 refills | Status: AC

## 2024-09-28 MED ORDER — SERTRALINE HCL 100 MG PO TABS: 100.0000 mg | ORAL_TABLET | Freq: Every day | ORAL | 1 refills | Status: AC

## 2024-09-28 MED ORDER — RISPERIDONE 2 MG PO TABS: 2.0000 mg | ORAL_TABLET | Freq: Every day | ORAL | 1 refills | Status: AC

## 2024-09-30 ENCOUNTER — Telehealth: Payer: Self-pay

## 2024-09-30 NOTE — Addendum Note (Signed)
 Addended by: CARVIN CROCK on: 09/30/2024 10:18 AM   Modules accepted: Level of Service

## 2024-09-30 NOTE — Telephone Encounter (Signed)
 Attempt to Re-Engage in Care  No office visit or HIV labs completed at [facility] within the last 12 months. Patient is considered out of care.   Last [facility] Visit: 01/25/23  Last HIV Viral Load:  HIV 1 RNA Quant  Date Value Ref Range Status  01/25/2023 Not Detected Copies/mL Final    Last CD4 Count:  CD4 T Cell Abs  Date Value Ref Range Status  10/20/2023 418 400 - 1,790 /uL Final    Medication Dispense History:   Dispensed Sold Days Supply Quantity Provider Pharmacy  TRIUMEQ  TAB (ABC600/DTG50/3TC 300) 09/13/2024 09/16/2024 30 30 each Maranda Lonni MATSU, MD [facility]  TRIUMEQ  TAB (ABC600/DTG50/3TC 300) 07/15/2024 No sold data 30 30 each Nichols, Tonya S, NP [facility]  TRIUMEQ  TAB (ABC600/DTG50/3TC 300) 06/08/2024 No sold data 30 30 each Nichols, Tonya S, NP [facility]    Interventions: Called patient, no answer. Left HIPAA compliant voicemail requesting callback. Called home number, her daughter and POA [name] answered. Scheduled appointment for 2/17. Triumeq  is being dispensed, most recently prescribed by patient's PCP.   Duration of Services: 10 minutes  Cylis Ayars, BSN, [facility]

## 2024-10-01 ENCOUNTER — Other Ambulatory Visit: Payer: Self-pay | Admitting: Nurse Practitioner

## 2024-10-01 DIAGNOSIS — I10 Essential (primary) hypertension: Secondary | ICD-10-CM

## 2024-10-01 DIAGNOSIS — F250 Schizoaffective disorder, bipolar type: Secondary | ICD-10-CM

## 2024-10-01 NOTE — Telephone Encounter (Signed)
 risperiDONE  (RISPERDAL ) 2 MG tablet [Pharmacy Med Name: RISPERIDONE  2MG  TABLETS]

## 2024-10-01 NOTE — Telephone Encounter (Signed)
 triamterene -hydrochlorothiazide  (MAXZIDE -25) 37.5-25 MG tablet [Pharmacy Med Name: TRIAMTERENE  37.5MG / HCTZ25MG  TABS]

## 2024-10-02 ENCOUNTER — Telehealth: Payer: Self-pay

## 2024-10-02 NOTE — Telephone Encounter (Signed)
 Copied from CRM [id number]. Topic: Clinical - Home Health Verbal Orders >> Oct 02, 2024 12:20 PM Victoria B wrote: Caller/Agency: will from [facility] Number: [phone] Patient has death in the family so needs to reschedule OT eval until next week Service Requested: Occupational Therapy Frequency: evaluation Any new concerns about the patient? no

## 2024-10-07 ENCOUNTER — Ambulatory Visit: Payer: Self-pay | Admitting: Nurse Practitioner

## 2024-10-20 ENCOUNTER — Ambulatory Visit: Admitting: Infectious Diseases

## 2024-10-29 ENCOUNTER — Ambulatory Visit: Admitting: Nurse Practitioner

## 2024-11-02 ENCOUNTER — Ambulatory Visit (HOSPITAL_COMMUNITY)

## 2024-11-05 ENCOUNTER — Ambulatory Visit (HOSPITAL_BASED_OUTPATIENT_CLINIC_OR_DEPARTMENT_OTHER): Admitting: Family Medicine

## 2024-11-19 ENCOUNTER — Ambulatory Visit: Admitting: Endocrinology
